# Patient Record
Sex: Female | Born: 1970 | Race: Black or African American | Hispanic: No | Marital: Married | Smoking: Never smoker
Health system: Southern US, Community
[De-identification: ages and names within clinical notes are randomized; demographics above are authoritative.]

## PROBLEM LIST (undated history)

## (undated) ENCOUNTER — Inpatient Hospital Stay (HOSPITAL_COMMUNITY): Payer: Managed Care, Other (non HMO)

## (undated) DIAGNOSIS — M797 Fibromyalgia: Secondary | ICD-10-CM

## (undated) DIAGNOSIS — L509 Urticaria, unspecified: Secondary | ICD-10-CM

## (undated) DIAGNOSIS — N83202 Unspecified ovarian cyst, left side: Secondary | ICD-10-CM

## (undated) DIAGNOSIS — R519 Headache, unspecified: Secondary | ICD-10-CM

## (undated) DIAGNOSIS — K219 Gastro-esophageal reflux disease without esophagitis: Secondary | ICD-10-CM

## (undated) DIAGNOSIS — E0580 Other thyrotoxicosis without thyrotoxic crisis or storm: Secondary | ICD-10-CM

## (undated) DIAGNOSIS — E119 Type 2 diabetes mellitus without complications: Secondary | ICD-10-CM

## (undated) DIAGNOSIS — E1140 Type 2 diabetes mellitus with diabetic neuropathy, unspecified: Secondary | ICD-10-CM

## (undated) DIAGNOSIS — D259 Leiomyoma of uterus, unspecified: Secondary | ICD-10-CM

## (undated) DIAGNOSIS — J189 Pneumonia, unspecified organism: Secondary | ICD-10-CM

## (undated) DIAGNOSIS — K5909 Other constipation: Secondary | ICD-10-CM

## (undated) DIAGNOSIS — I749 Embolism and thrombosis of unspecified artery: Secondary | ICD-10-CM

## (undated) DIAGNOSIS — K3184 Gastroparesis: Secondary | ICD-10-CM

## (undated) DIAGNOSIS — T783XXA Angioneurotic edema, initial encounter: Secondary | ICD-10-CM

## (undated) DIAGNOSIS — E1159 Type 2 diabetes mellitus with other circulatory complications: Secondary | ICD-10-CM

## (undated) DIAGNOSIS — K589 Irritable bowel syndrome without diarrhea: Secondary | ICD-10-CM

## (undated) DIAGNOSIS — N186 End stage renal disease: Secondary | ICD-10-CM

## (undated) DIAGNOSIS — R05 Cough: Secondary | ICD-10-CM

## (undated) DIAGNOSIS — D631 Anemia in chronic kidney disease: Secondary | ICD-10-CM

## (undated) DIAGNOSIS — Z7902 Long term (current) use of antithrombotics/antiplatelets: Secondary | ICD-10-CM

## (undated) DIAGNOSIS — E1129 Type 2 diabetes mellitus with other diabetic kidney complication: Secondary | ICD-10-CM

## (undated) DIAGNOSIS — Z5189 Encounter for other specified aftercare: Secondary | ICD-10-CM

## (undated) DIAGNOSIS — K859 Acute pancreatitis without necrosis or infection, unspecified: Secondary | ICD-10-CM

## (undated) DIAGNOSIS — I10 Essential (primary) hypertension: Secondary | ICD-10-CM

## (undated) DIAGNOSIS — E1169 Type 2 diabetes mellitus with other specified complication: Secondary | ICD-10-CM

## (undated) DIAGNOSIS — H6982 Other specified disorders of Eustachian tube, left ear: Secondary | ICD-10-CM

## (undated) DIAGNOSIS — E1165 Type 2 diabetes mellitus with hyperglycemia: Secondary | ICD-10-CM

## (undated) DIAGNOSIS — E538 Deficiency of other specified B group vitamins: Secondary | ICD-10-CM

## (undated) DIAGNOSIS — T82898A Other specified complication of vascular prosthetic devices, implants and grafts, initial encounter: Secondary | ICD-10-CM

## (undated) DIAGNOSIS — F329 Major depressive disorder, single episode, unspecified: Secondary | ICD-10-CM

## (undated) DIAGNOSIS — Z992 Dependence on renal dialysis: Secondary | ICD-10-CM

## (undated) DIAGNOSIS — M545 Low back pain: Secondary | ICD-10-CM

## (undated) DIAGNOSIS — E785 Hyperlipidemia, unspecified: Secondary | ICD-10-CM

## (undated) DIAGNOSIS — R531 Weakness: Secondary | ICD-10-CM

## (undated) DIAGNOSIS — E113599 Type 2 diabetes mellitus with proliferative diabetic retinopathy without macular edema, unspecified eye: Secondary | ICD-10-CM

## (undated) DIAGNOSIS — M216X2 Other acquired deformities of left foot: Secondary | ICD-10-CM

## (undated) DIAGNOSIS — M216X1 Other acquired deformities of right foot: Secondary | ICD-10-CM

## (undated) DIAGNOSIS — I639 Cerebral infarction, unspecified: Secondary | ICD-10-CM

## (undated) HISTORY — DX: Acute pancreatitis without necrosis or infection, unspecified: K85.90

## (undated) HISTORY — DX: Type 2 diabetes mellitus with other circulatory complications: E11.59

## (undated) HISTORY — DX: End stage renal disease: N18.6

## (undated) HISTORY — DX: Type 2 diabetes mellitus without complications: E11.9

## (undated) HISTORY — DX: Fibromyalgia: M79.7

## (undated) HISTORY — PX: FOOT SURGERY: SHX648

## (undated) HISTORY — DX: Encounter for other specified aftercare: Z51.89

## (undated) HISTORY — DX: Angioneurotic edema, initial encounter: T78.3XXA

## (undated) HISTORY — DX: Gastroparesis: K31.84

## (undated) HISTORY — DX: Essential (primary) hypertension: I10

## (undated) HISTORY — DX: Type 2 diabetes mellitus with other diabetic kidney complication: E11.29

## (undated) HISTORY — DX: Other thyrotoxicosis without thyrotoxic crisis or storm: E05.80

## (undated) HISTORY — DX: Other acquired deformities of left foot: M21.6X2

## (undated) HISTORY — DX: Other acquired deformities of right foot: M21.6X1

## (undated) HISTORY — DX: Other specified disorders of eustachian tube, left ear: H69.82

## (undated) HISTORY — DX: Embolism and thrombosis of unspecified artery: I74.9

## (undated) HISTORY — DX: Anemia in chronic kidney disease: D63.1

## (undated) HISTORY — PX: UTERINE FIBROID SURGERY: SHX826

## (undated) HISTORY — DX: Other constipation: K59.09

## (undated) HISTORY — DX: Deficiency of other specified B group vitamins: E53.8

## (undated) HISTORY — PX: EYE SURGERY: SHX253

## (undated) HISTORY — DX: Irritable bowel syndrome, unspecified: K58.9

## (undated) HISTORY — DX: Type 2 diabetes mellitus with diabetic neuropathy, unspecified: E11.40

## (undated) HISTORY — DX: Cerebral infarction, unspecified: I63.9

## (undated) HISTORY — DX: Type 2 diabetes mellitus with other specified complication: E11.69

## (undated) HISTORY — DX: Leiomyoma of uterus, unspecified: D25.9

## (undated) HISTORY — DX: Major depressive disorder, single episode, unspecified: F32.9

## (undated) HISTORY — DX: Weakness: R53.1

## (undated) HISTORY — DX: Type 2 diabetes mellitus with hyperglycemia: E11.65

## (undated) HISTORY — DX: Cough: R05

## (undated) HISTORY — DX: Urticaria, unspecified: L50.9

## (undated) HISTORY — DX: Dependence on renal dialysis: Z99.2

## (undated) HISTORY — DX: Other specified complication of vascular prosthetic devices, implants and grafts, initial encounter: T82.898A

## (undated) HISTORY — PX: VITRECTOMY: SHX106

## (undated) HISTORY — DX: Unspecified ovarian cyst, left side: N83.202

## (undated) HISTORY — DX: Low back pain: M54.5

## (undated) HISTORY — DX: Hyperlipidemia, unspecified: E78.5

## (undated) HISTORY — DX: Type 2 diabetes mellitus with proliferative diabetic retinopathy without macular edema, unspecified eye: E11.3599

## (undated) HISTORY — DX: Long term (current) use of antithrombotics/antiplatelets: Z79.02

## (undated) HISTORY — PX: MYOMECTOMY: SHX85

---

## 1998-12-23 ENCOUNTER — Emergency Department (HOSPITAL_COMMUNITY): Admission: EM | Admit: 1998-12-23 | Discharge: 1998-12-23 | Payer: Self-pay | Admitting: Emergency Medicine

## 1998-12-29 ENCOUNTER — Emergency Department (HOSPITAL_COMMUNITY): Admission: EM | Admit: 1998-12-29 | Discharge: 1998-12-29 | Payer: Self-pay | Admitting: Internal Medicine

## 1999-01-17 ENCOUNTER — Emergency Department (HOSPITAL_COMMUNITY): Admission: EM | Admit: 1999-01-17 | Discharge: 1999-01-17 | Payer: Self-pay | Admitting: Emergency Medicine

## 1999-01-19 ENCOUNTER — Emergency Department (HOSPITAL_COMMUNITY): Admission: EM | Admit: 1999-01-19 | Discharge: 1999-01-20 | Payer: Self-pay

## 1999-01-20 ENCOUNTER — Encounter: Payer: Self-pay | Admitting: Emergency Medicine

## 1999-08-22 ENCOUNTER — Emergency Department (HOSPITAL_COMMUNITY): Admission: EM | Admit: 1999-08-22 | Discharge: 1999-08-22 | Payer: Self-pay | Admitting: Emergency Medicine

## 1999-08-24 ENCOUNTER — Encounter: Admission: RE | Admit: 1999-08-24 | Discharge: 1999-08-24 | Payer: Self-pay | Admitting: Internal Medicine

## 1999-09-23 ENCOUNTER — Encounter: Admission: RE | Admit: 1999-09-23 | Discharge: 1999-09-23 | Payer: Self-pay | Admitting: Internal Medicine

## 1999-09-28 ENCOUNTER — Encounter: Admission: RE | Admit: 1999-09-28 | Discharge: 1999-12-27 | Payer: Self-pay | Admitting: *Deleted

## 1999-11-02 ENCOUNTER — Emergency Department (HOSPITAL_COMMUNITY): Admission: EM | Admit: 1999-11-02 | Discharge: 1999-11-02 | Payer: Self-pay | Admitting: Emergency Medicine

## 1999-11-23 ENCOUNTER — Emergency Department (HOSPITAL_COMMUNITY): Admission: EM | Admit: 1999-11-23 | Discharge: 1999-11-23 | Payer: Self-pay | Admitting: Emergency Medicine

## 1999-12-11 ENCOUNTER — Encounter: Admission: RE | Admit: 1999-12-11 | Discharge: 1999-12-11 | Payer: Self-pay | Admitting: Internal Medicine

## 2000-05-17 ENCOUNTER — Inpatient Hospital Stay (HOSPITAL_COMMUNITY): Admission: AD | Admit: 2000-05-17 | Discharge: 2000-05-17 | Payer: Self-pay | Admitting: *Deleted

## 2000-05-20 ENCOUNTER — Emergency Department (HOSPITAL_COMMUNITY): Admission: EM | Admit: 2000-05-20 | Discharge: 2000-05-21 | Payer: Self-pay | Admitting: Emergency Medicine

## 2000-05-22 ENCOUNTER — Emergency Department (HOSPITAL_COMMUNITY): Admission: EM | Admit: 2000-05-22 | Discharge: 2000-05-22 | Payer: Self-pay | Admitting: *Deleted

## 2000-05-23 ENCOUNTER — Emergency Department (HOSPITAL_COMMUNITY): Admission: EM | Admit: 2000-05-23 | Discharge: 2000-05-23 | Payer: Self-pay | Admitting: *Deleted

## 2000-07-22 ENCOUNTER — Emergency Department (HOSPITAL_COMMUNITY): Admission: EM | Admit: 2000-07-22 | Discharge: 2000-07-22 | Payer: Self-pay | Admitting: Emergency Medicine

## 2001-02-15 ENCOUNTER — Emergency Department (HOSPITAL_COMMUNITY): Admission: EM | Admit: 2001-02-15 | Discharge: 2001-02-15 | Payer: Self-pay | Admitting: Emergency Medicine

## 2001-02-15 ENCOUNTER — Encounter: Payer: Self-pay | Admitting: Emergency Medicine

## 2001-02-16 ENCOUNTER — Encounter: Admission: RE | Admit: 2001-02-16 | Discharge: 2001-02-16 | Payer: Self-pay | Admitting: Internal Medicine

## 2001-02-23 ENCOUNTER — Encounter: Admission: RE | Admit: 2001-02-23 | Discharge: 2001-02-23 | Payer: Self-pay | Admitting: Hematology and Oncology

## 2001-03-16 ENCOUNTER — Encounter: Admission: RE | Admit: 2001-03-16 | Discharge: 2001-03-16 | Payer: Self-pay | Admitting: Hematology and Oncology

## 2001-03-20 ENCOUNTER — Encounter: Admission: RE | Admit: 2001-03-20 | Discharge: 2001-06-18 | Payer: Self-pay

## 2001-03-23 ENCOUNTER — Encounter: Admission: RE | Admit: 2001-03-23 | Discharge: 2001-03-23 | Payer: Self-pay | Admitting: Hematology and Oncology

## 2001-04-18 ENCOUNTER — Encounter: Admission: RE | Admit: 2001-04-18 | Discharge: 2001-04-18 | Payer: Self-pay | Admitting: Internal Medicine

## 2001-04-19 ENCOUNTER — Emergency Department (HOSPITAL_COMMUNITY): Admission: EM | Admit: 2001-04-19 | Discharge: 2001-04-19 | Payer: Self-pay | Admitting: Emergency Medicine

## 2001-06-14 ENCOUNTER — Emergency Department (HOSPITAL_COMMUNITY): Admission: EM | Admit: 2001-06-14 | Discharge: 2001-06-14 | Payer: Self-pay | Admitting: Emergency Medicine

## 2002-04-12 ENCOUNTER — Emergency Department (HOSPITAL_COMMUNITY): Admission: EM | Admit: 2002-04-12 | Discharge: 2002-04-12 | Payer: Self-pay | Admitting: Internal Medicine

## 2002-09-17 ENCOUNTER — Inpatient Hospital Stay (HOSPITAL_COMMUNITY): Admission: AD | Admit: 2002-09-17 | Discharge: 2002-09-17 | Payer: Self-pay | Admitting: *Deleted

## 2003-01-02 ENCOUNTER — Emergency Department (HOSPITAL_COMMUNITY): Admission: EM | Admit: 2003-01-02 | Discharge: 2003-01-02 | Payer: Self-pay | Admitting: Emergency Medicine

## 2003-01-03 ENCOUNTER — Emergency Department (HOSPITAL_COMMUNITY): Admission: EM | Admit: 2003-01-03 | Discharge: 2003-01-03 | Payer: Self-pay | Admitting: Emergency Medicine

## 2003-01-18 ENCOUNTER — Emergency Department (HOSPITAL_COMMUNITY): Admission: EM | Admit: 2003-01-18 | Discharge: 2003-01-18 | Payer: Self-pay | Admitting: Emergency Medicine

## 2003-04-30 ENCOUNTER — Encounter: Payer: Self-pay | Admitting: Obstetrics

## 2003-04-30 ENCOUNTER — Ambulatory Visit (HOSPITAL_COMMUNITY): Admission: RE | Admit: 2003-04-30 | Discharge: 2003-04-30 | Payer: Self-pay | Admitting: Obstetrics

## 2003-05-21 ENCOUNTER — Encounter: Payer: Self-pay | Admitting: Emergency Medicine

## 2003-05-21 ENCOUNTER — Emergency Department (HOSPITAL_COMMUNITY): Admission: EM | Admit: 2003-05-21 | Discharge: 2003-05-21 | Payer: Self-pay | Admitting: Emergency Medicine

## 2003-07-11 ENCOUNTER — Emergency Department (HOSPITAL_COMMUNITY): Admission: EM | Admit: 2003-07-11 | Discharge: 2003-07-11 | Payer: Self-pay | Admitting: Emergency Medicine

## 2003-10-01 ENCOUNTER — Encounter (HOSPITAL_BASED_OUTPATIENT_CLINIC_OR_DEPARTMENT_OTHER): Admission: RE | Admit: 2003-10-01 | Discharge: 2003-12-30 | Payer: Self-pay | Admitting: Internal Medicine

## 2003-12-08 ENCOUNTER — Emergency Department (HOSPITAL_COMMUNITY): Admission: AC | Admit: 2003-12-08 | Discharge: 2003-12-09 | Payer: Self-pay

## 2003-12-08 ENCOUNTER — Encounter (INDEPENDENT_AMBULATORY_CARE_PROVIDER_SITE_OTHER): Payer: Self-pay | Admitting: *Deleted

## 2004-02-18 ENCOUNTER — Encounter: Admission: RE | Admit: 2004-02-18 | Discharge: 2004-05-18 | Payer: Self-pay | Admitting: Orthopedic Surgery

## 2004-06-09 ENCOUNTER — Emergency Department (HOSPITAL_COMMUNITY): Admission: EM | Admit: 2004-06-09 | Discharge: 2004-06-09 | Payer: Self-pay | Admitting: Emergency Medicine

## 2004-08-14 ENCOUNTER — Emergency Department (HOSPITAL_COMMUNITY): Admission: EM | Admit: 2004-08-14 | Discharge: 2004-08-15 | Payer: Self-pay | Admitting: Emergency Medicine

## 2004-08-23 ENCOUNTER — Emergency Department (HOSPITAL_COMMUNITY): Admission: EM | Admit: 2004-08-23 | Discharge: 2004-08-24 | Payer: Self-pay | Admitting: Emergency Medicine

## 2004-08-26 ENCOUNTER — Emergency Department (HOSPITAL_COMMUNITY): Admission: EM | Admit: 2004-08-26 | Discharge: 2004-08-26 | Payer: Self-pay | Admitting: Emergency Medicine

## 2004-10-01 ENCOUNTER — Emergency Department (HOSPITAL_COMMUNITY): Admission: EM | Admit: 2004-10-01 | Discharge: 2004-10-02 | Payer: Self-pay | Admitting: *Deleted

## 2005-01-08 ENCOUNTER — Emergency Department (HOSPITAL_COMMUNITY): Admission: EM | Admit: 2005-01-08 | Discharge: 2005-01-08 | Payer: Self-pay | Admitting: Emergency Medicine

## 2005-05-02 ENCOUNTER — Emergency Department (HOSPITAL_COMMUNITY): Admission: EM | Admit: 2005-05-02 | Discharge: 2005-05-02 | Payer: Self-pay | Admitting: Emergency Medicine

## 2005-05-12 ENCOUNTER — Emergency Department (HOSPITAL_COMMUNITY): Admission: EM | Admit: 2005-05-12 | Discharge: 2005-05-12 | Payer: Self-pay | Admitting: Emergency Medicine

## 2005-07-07 ENCOUNTER — Emergency Department (HOSPITAL_COMMUNITY): Admission: EM | Admit: 2005-07-07 | Discharge: 2005-07-07 | Payer: Self-pay | Admitting: Emergency Medicine

## 2005-07-16 ENCOUNTER — Emergency Department (HOSPITAL_COMMUNITY): Admission: EM | Admit: 2005-07-16 | Discharge: 2005-07-17 | Payer: Self-pay | Admitting: Emergency Medicine

## 2005-07-17 ENCOUNTER — Emergency Department (HOSPITAL_COMMUNITY): Admission: EM | Admit: 2005-07-17 | Discharge: 2005-07-17 | Payer: Self-pay | Admitting: Emergency Medicine

## 2005-08-15 ENCOUNTER — Emergency Department (HOSPITAL_COMMUNITY): Admission: EM | Admit: 2005-08-15 | Discharge: 2005-08-15 | Payer: Self-pay | Admitting: Emergency Medicine

## 2005-09-06 ENCOUNTER — Inpatient Hospital Stay (HOSPITAL_COMMUNITY): Admission: AD | Admit: 2005-09-06 | Discharge: 2005-09-06 | Payer: Self-pay | Admitting: Obstetrics

## 2006-01-23 ENCOUNTER — Emergency Department (HOSPITAL_COMMUNITY): Admission: EM | Admit: 2006-01-23 | Discharge: 2006-01-23 | Payer: Self-pay | Admitting: Emergency Medicine

## 2006-04-04 ENCOUNTER — Emergency Department (HOSPITAL_COMMUNITY): Admission: EM | Admit: 2006-04-04 | Discharge: 2006-04-04 | Payer: Self-pay | Admitting: Emergency Medicine

## 2006-07-19 ENCOUNTER — Emergency Department (HOSPITAL_COMMUNITY): Admission: EM | Admit: 2006-07-19 | Discharge: 2006-07-19 | Payer: Self-pay | Admitting: Emergency Medicine

## 2006-07-20 ENCOUNTER — Emergency Department (HOSPITAL_COMMUNITY): Admission: EM | Admit: 2006-07-20 | Discharge: 2006-07-20 | Payer: Self-pay | Admitting: Emergency Medicine

## 2006-10-12 ENCOUNTER — Emergency Department (HOSPITAL_COMMUNITY): Admission: EM | Admit: 2006-10-12 | Discharge: 2006-10-13 | Payer: Self-pay | Admitting: Emergency Medicine

## 2007-01-08 ENCOUNTER — Emergency Department (HOSPITAL_COMMUNITY): Admission: EM | Admit: 2007-01-08 | Discharge: 2007-01-08 | Payer: Self-pay | Admitting: Emergency Medicine

## 2007-03-31 ENCOUNTER — Ambulatory Visit (HOSPITAL_COMMUNITY): Admission: RE | Admit: 2007-03-31 | Discharge: 2007-03-31 | Payer: Self-pay | Admitting: Obstetrics

## 2007-05-21 ENCOUNTER — Emergency Department (HOSPITAL_COMMUNITY): Admission: EM | Admit: 2007-05-21 | Discharge: 2007-05-22 | Payer: Self-pay | Admitting: Emergency Medicine

## 2007-07-01 ENCOUNTER — Emergency Department (HOSPITAL_COMMUNITY): Admission: EM | Admit: 2007-07-01 | Discharge: 2007-07-02 | Payer: Self-pay | Admitting: Emergency Medicine

## 2007-10-12 ENCOUNTER — Ambulatory Visit: Payer: Self-pay | Admitting: *Deleted

## 2007-12-24 ENCOUNTER — Encounter (INDEPENDENT_AMBULATORY_CARE_PROVIDER_SITE_OTHER): Payer: Self-pay | Admitting: *Deleted

## 2007-12-24 ENCOUNTER — Emergency Department (HOSPITAL_COMMUNITY): Admission: EM | Admit: 2007-12-24 | Discharge: 2007-12-24 | Payer: Self-pay | Admitting: Emergency Medicine

## 2007-12-26 ENCOUNTER — Emergency Department (HOSPITAL_COMMUNITY): Admission: EM | Admit: 2007-12-26 | Discharge: 2007-12-26 | Payer: Self-pay | Admitting: *Deleted

## 2007-12-26 ENCOUNTER — Encounter (INDEPENDENT_AMBULATORY_CARE_PROVIDER_SITE_OTHER): Payer: Self-pay | Admitting: *Deleted

## 2008-06-29 ENCOUNTER — Emergency Department (HOSPITAL_COMMUNITY): Admission: EM | Admit: 2008-06-29 | Discharge: 2008-06-29 | Payer: Self-pay | Admitting: Emergency Medicine

## 2008-09-15 ENCOUNTER — Inpatient Hospital Stay (HOSPITAL_COMMUNITY): Admission: EM | Admit: 2008-09-15 | Discharge: 2008-09-21 | Payer: Self-pay | Admitting: Emergency Medicine

## 2008-09-16 ENCOUNTER — Ambulatory Visit: Payer: Self-pay | Admitting: Vascular Surgery

## 2008-09-16 ENCOUNTER — Encounter (INDEPENDENT_AMBULATORY_CARE_PROVIDER_SITE_OTHER): Payer: Self-pay | Admitting: Internal Medicine

## 2008-09-17 ENCOUNTER — Encounter (INDEPENDENT_AMBULATORY_CARE_PROVIDER_SITE_OTHER): Payer: Self-pay | Admitting: Cardiovascular Disease

## 2008-09-17 ENCOUNTER — Encounter (INDEPENDENT_AMBULATORY_CARE_PROVIDER_SITE_OTHER): Payer: Self-pay | Admitting: *Deleted

## 2008-09-20 ENCOUNTER — Encounter (INDEPENDENT_AMBULATORY_CARE_PROVIDER_SITE_OTHER): Payer: Self-pay | Admitting: *Deleted

## 2008-09-20 ENCOUNTER — Encounter: Payer: Self-pay | Admitting: Internal Medicine

## 2009-02-27 ENCOUNTER — Encounter (INDEPENDENT_AMBULATORY_CARE_PROVIDER_SITE_OTHER): Payer: Self-pay | Admitting: *Deleted

## 2009-02-27 ENCOUNTER — Emergency Department (HOSPITAL_COMMUNITY): Admission: EM | Admit: 2009-02-27 | Discharge: 2009-02-27 | Payer: Self-pay | Admitting: Emergency Medicine

## 2009-03-25 ENCOUNTER — Ambulatory Visit (HOSPITAL_COMMUNITY): Admission: RE | Admit: 2009-03-25 | Discharge: 2009-03-25 | Payer: Self-pay | Admitting: Obstetrics

## 2009-04-22 ENCOUNTER — Ambulatory Visit (HOSPITAL_COMMUNITY): Admission: RE | Admit: 2009-04-22 | Discharge: 2009-04-22 | Payer: Self-pay | Admitting: Gynecology

## 2009-11-04 ENCOUNTER — Emergency Department (HOSPITAL_COMMUNITY): Admission: EM | Admit: 2009-11-04 | Discharge: 2009-11-04 | Payer: Self-pay | Admitting: Emergency Medicine

## 2010-01-02 ENCOUNTER — Emergency Department (HOSPITAL_COMMUNITY): Admission: EM | Admit: 2010-01-02 | Discharge: 2010-01-03 | Payer: Self-pay | Admitting: Emergency Medicine

## 2010-01-14 ENCOUNTER — Inpatient Hospital Stay (HOSPITAL_COMMUNITY): Admission: AD | Admit: 2010-01-14 | Discharge: 2010-01-15 | Payer: Self-pay | Admitting: Obstetrics

## 2010-03-05 ENCOUNTER — Emergency Department (HOSPITAL_COMMUNITY): Admission: EM | Admit: 2010-03-05 | Discharge: 2010-03-05 | Payer: Self-pay | Admitting: Emergency Medicine

## 2010-04-22 ENCOUNTER — Emergency Department (HOSPITAL_COMMUNITY): Admission: EM | Admit: 2010-04-22 | Discharge: 2010-04-23 | Payer: Self-pay | Admitting: Emergency Medicine

## 2010-07-09 ENCOUNTER — Emergency Department (HOSPITAL_COMMUNITY): Admission: EM | Admit: 2010-07-09 | Discharge: 2010-07-09 | Payer: Self-pay | Admitting: Emergency Medicine

## 2010-10-10 ENCOUNTER — Emergency Department (HOSPITAL_COMMUNITY): Admission: EM | Admit: 2010-10-10 | Discharge: 2010-10-11 | Payer: Self-pay | Admitting: Emergency Medicine

## 2010-10-12 ENCOUNTER — Emergency Department (HOSPITAL_COMMUNITY): Admission: EM | Admit: 2010-10-12 | Discharge: 2010-10-12 | Payer: Self-pay | Admitting: Emergency Medicine

## 2011-01-11 ENCOUNTER — Emergency Department (HOSPITAL_COMMUNITY)
Admission: EM | Admit: 2011-01-11 | Discharge: 2011-01-12 | Payer: Self-pay | Source: Home / Self Care | Admitting: Emergency Medicine

## 2011-01-12 ENCOUNTER — Encounter (INDEPENDENT_AMBULATORY_CARE_PROVIDER_SITE_OTHER): Payer: Self-pay | Admitting: *Deleted

## 2011-01-13 LAB — DIFFERENTIAL
Basophils Absolute: 0 10*3/uL (ref 0.0–0.1)
Basophils Relative: 0 % (ref 0–1)
Eosinophils Absolute: 0.1 10*3/uL (ref 0.0–0.7)
Eosinophils Relative: 1 % (ref 0–5)
Lymphocytes Relative: 6 % — ABNORMAL LOW (ref 12–46)
Lymphs Abs: 0.5 10*3/uL — ABNORMAL LOW (ref 0.7–4.0)
Monocytes Absolute: 0.4 10*3/uL (ref 0.1–1.0)
Monocytes Relative: 6 % (ref 3–12)
Neutro Abs: 6.8 10*3/uL (ref 1.7–7.7)
Neutrophils Relative %: 88 % — ABNORMAL HIGH (ref 43–77)

## 2011-01-13 LAB — URINALYSIS, ROUTINE W REFLEX MICROSCOPIC
Leukocytes, UA: NEGATIVE
Nitrite: NEGATIVE
Protein, ur: >300 mg/dL — AB
Specific Gravity, Urine: 1.036 — ABNORMAL HIGH (ref 1.005–1.030)
Urine Glucose, Fasting: 250 mg/dL — AB
Urobilinogen, UA: 0.2 mg/dL (ref 0.0–1.0)
pH: 6 (ref 5.0–8.0)

## 2011-01-13 LAB — COMPREHENSIVE METABOLIC PANEL
ALT: 18 U/L (ref 0–35)
AST: 20 U/L (ref 0–37)
Albumin: 3 g/dL — ABNORMAL LOW (ref 3.5–5.2)
Alkaline Phosphatase: 91 U/L (ref 39–117)
BUN: 9 mg/dL (ref 6–23)
CO2: 25 mEq/L (ref 19–32)
Calcium: 8.8 mg/dL (ref 8.4–10.5)
Chloride: 103 mEq/L (ref 96–112)
Creatinine, Ser: 1.46 mg/dL — ABNORMAL HIGH (ref 0.4–1.2)
GFR calc Af Amer: 48 mL/min — ABNORMAL LOW (ref >60)
GFR calc non Af Amer: 39 mL/min — ABNORMAL LOW (ref >60)
Glucose, Bld: 241 mg/dL — ABNORMAL HIGH (ref 70–99)
Potassium: 4 mEq/L (ref 3.5–5.1)
Sodium: 135 mEq/L (ref 135–145)
Total Bilirubin: 0.7 mg/dL (ref 0.3–1.2)
Total Protein: 7.7 g/dL (ref 6.0–8.3)

## 2011-01-13 LAB — CBC
HCT: 36.3 % (ref 36.0–46.0)
Hemoglobin: 11.9 g/dL — ABNORMAL LOW (ref 12.0–15.0)
MCH: 28.1 pg (ref 26.0–34.0)
MCHC: 32.8 g/dL (ref 30.0–36.0)
MCV: 85.6 fL (ref 78.0–100.0)
Platelets: 220 10*3/uL (ref 150–400)
RBC: 4.24 MIL/uL (ref 3.87–5.11)
RDW: 12.3 % (ref 11.5–15.5)
WBC: 7.8 10*3/uL (ref 4.0–10.5)

## 2011-01-13 LAB — URINE MICROSCOPIC-ADD ON

## 2011-01-13 LAB — GLUCOSE, CAPILLARY: Glucose-Capillary: 211 mg/dL — ABNORMAL HIGH (ref 70–99)

## 2011-01-13 LAB — LIPASE, BLOOD: Lipase: 36 U/L (ref 11–59)

## 2011-01-13 LAB — POCT PREGNANCY, URINE: Preg Test, Ur: NEGATIVE

## 2011-01-17 ENCOUNTER — Encounter: Payer: Self-pay | Admitting: Gynecology

## 2011-01-21 ENCOUNTER — Inpatient Hospital Stay (HOSPITAL_COMMUNITY)
Admission: EM | Admit: 2011-01-21 | Discharge: 2011-01-22 | Payer: Self-pay | Source: Home / Self Care | Attending: Internal Medicine | Admitting: Internal Medicine

## 2011-01-21 LAB — HEPATIC FUNCTION PANEL
ALT: 14 U/L (ref 0–35)
AST: 16 U/L (ref 0–37)
Bilirubin, Direct: 0.1 mg/dL (ref 0.0–0.3)
Indirect Bilirubin: 0.2 mg/dL — ABNORMAL LOW (ref 0.3–0.9)
Total Bilirubin: 0.3 mg/dL (ref 0.3–1.2)
Total Protein: 7 g/dL (ref 6.0–8.3)

## 2011-01-21 LAB — DIFFERENTIAL
Eosinophils Absolute: 0 10*3/uL (ref 0.0–0.7)
Lymphocytes Relative: 33 % (ref 12–46)
Lymphs Abs: 2.7 10*3/uL (ref 0.7–4.0)
Neutrophils Relative %: 60 % (ref 43–77)

## 2011-01-21 LAB — BASIC METABOLIC PANEL
BUN: 7 mg/dL (ref 6–23)
Calcium: 8.4 mg/dL (ref 8.4–10.5)
Creatinine, Ser: 1.38 mg/dL — ABNORMAL HIGH (ref 0.4–1.2)
GFR calc non Af Amer: 42 mL/min — ABNORMAL LOW (ref >60)

## 2011-01-21 LAB — URINE CULTURE
Colony Count: >=100000
Culture  Setup Time: 201201170441

## 2011-01-21 LAB — URINALYSIS, ROUTINE W REFLEX MICROSCOPIC
Bilirubin Urine: NEGATIVE
Nitrite: NEGATIVE
Protein, ur: >300 mg/dL — AB
Urine Glucose, Fasting: >1000 mg/dL — AB

## 2011-01-21 LAB — LIPASE, BLOOD: Lipase: 25 U/L (ref 11–59)

## 2011-01-21 LAB — GLUCOSE, CAPILLARY
Glucose-Capillary: 395 mg/dL — ABNORMAL HIGH (ref 70–99)
Glucose-Capillary: 206 mg/dL — ABNORMAL HIGH (ref 70–99)

## 2011-01-21 LAB — CBC
MCV: 84.3 fL (ref 78.0–100.0)
Platelets: 297 10*3/uL (ref 150–400)
RBC: 3.94 MIL/uL (ref 3.87–5.11)
WBC: 8.1 10*3/uL (ref 4.0–10.5)

## 2011-01-22 ENCOUNTER — Encounter (INDEPENDENT_AMBULATORY_CARE_PROVIDER_SITE_OTHER): Payer: Self-pay | Admitting: *Deleted

## 2011-01-22 LAB — BASIC METABOLIC PANEL
BUN: 5 mg/dL — ABNORMAL LOW (ref 6–23)
Calcium: 8.1 mg/dL — ABNORMAL LOW (ref 8.4–10.5)
Creatinine, Ser: 1.3 mg/dL — ABNORMAL HIGH (ref 0.4–1.2)
GFR calc non Af Amer: 45 mL/min — ABNORMAL LOW (ref >60)
Glucose, Bld: 224 mg/dL — ABNORMAL HIGH (ref 70–99)

## 2011-01-22 LAB — GLUCOSE, CAPILLARY: Glucose-Capillary: 201 mg/dL — ABNORMAL HIGH (ref 70–99)

## 2011-01-22 LAB — CK TOTAL AND CKMB (NOT AT ARMC): Relative Index: INVALID (ref 0.0–2.5)

## 2011-01-22 LAB — CBC
HCT: 30 % — ABNORMAL LOW (ref 36.0–46.0)
MCH: 27.2 pg (ref 26.0–34.0)
MCHC: 32 g/dL (ref 30.0–36.0)
MCV: 85 fL (ref 78.0–100.0)
RDW: 12.4 % (ref 11.5–15.5)

## 2011-01-22 LAB — TROPONIN I: Troponin I: 0.02 ng/mL (ref 0.00–0.06)

## 2011-01-22 LAB — HEMOGLOBIN A1C: Mean Plasma Glucose: 321 mg/dL — ABNORMAL HIGH (ref <117)

## 2011-01-24 NOTE — Discharge Summary (Signed)
Heather [name], Heather [name]           ACCOUNT NO.:  [id number]  MEDICAL RECORD NO.:  [id number]          PATIENT TYPE:  INP  LOCATION:  [place]                         FACILITY:  [facility]  PHYSICIAN:  Heather Dawson, MD       DATE OF BIRTH:  1971-01-22  DATE OF ADMISSION:  01/21/2011 DATE OF DISCHARGE:  01/22/2011                              DISCHARGE SUMMARY   PRIMARY CARE PHYSICIAN:  Heather [name], M.D.  DISCHARGE DIAGNOSES: 1. Nausea, vomiting improved during the course of hospital stay, most     likely due to diabetic gastroparesis. 2. Delayed gastric emptying study to suggest diabetic gastroparesis. 3. Chronic kidney disease stage II. 4. Hypertension. 5. Insulin-dependent diabetes. 6. Fibromyalgia. 7. Hypertension. 8. Uterine fibroid. 9. Anemia most likely due to chronic disease.  DISCHARGE MEDICATIONS: 1. Oxycodone 5 mg IR every hours as needed for pain.  The patient was     instructed to minimize use of this medication as much as possible.     The patient was given only 10 tablets. 2. Reglan 10 mg p.o. daily before meals and bedtime for 10 days.  The     patient was given total of 40 tablets. 3. Ibuprofen over-the-counter, 4 tablets q.8h. as needed for pain. 4. Lyrica 300 mg daily at bedtime. 5. NovoLog overall sliding scale 14 to 30 units subcutaneously twice     daily.  BRIEF ADMITTING HISTORY AND PHYSICAL:  [name] is a 40 year old obese African-American female with history of hypertension, 12-year history of diabetes, who presents with a 2-week duration of intractable nausea and vomiting.  RADIOLOGY AND IMAGING:  The patient had a CT of the abdomen, pelvis on January 12, 2011, which shows no acute abdominal pelvic findings. Grossly stable fibroid involving the lower uterine segment posteriorly. Stable left renal cortical scarring and the right defect.  The patient had gastric emptying study on January 22, 2011, which showed delayed gastric emptying at 120  minutes.  The patient had 53% radiotracer activity remaining in the stomach, normal is less than 30%.  LABORATORY DATA:  CBC shows a white count of 6.7, hemoglobin 9.6, hematocrit 30.0, platelet count 304,000.  Electrolytes normal with a BUN of 5, creatinine is 1.30.  Hemoglobin A1c is 12.8.  Troponins negative x3.  TSH is 1.886.  UA was negative for nitrites and leukocytes.  Lipase was 25.  Liver function tests were normal except albumin is 2.9.  HOSPITAL COURSE BY PROBLEM: 1. Nausea and vomiting.  Vomiting resolved during the hospital stay.     Nausea, the patient still had intermittent nausea but was able to     tolerate a p.o. diet.  The patient had gastric emptying study as     indicated above.  I spoke with Dr. Paulita [name] with [facility], who stated that the patient can be started on     Reglan 10 mg p.o. before meals and bedtime schedules for diabetic     gastroparesis and encouraged the patient to eat frequent small     meals with low fiber diet, low fat diet.  Have the patient followup     in his  office in 1 week. 2. Diabetes, not well-controlled.  The patient will need better     control of her diabetes.  Hemoglobin A1c is 12.8.  Will defer to     her primary care physician for further management. Will need     tighter control if the patient had diabetic gastroparesis. 3. Chronic kidney disease stage II.  Creatinine at baseline compared     to creatinine in 2009. 4. Hypertension.  The patient is not on ACE inhibitor, reason is     uncertain at this time.  Will defer to her primary care physician     whether ACE inhibitor should be added for nephroprotective effects     from diabetes.  The patient's blood pressure is 134/87.  Defer to     primary care physician for further management. 5. Anemia, most likely due to anemia of chronic disease.  Hemoglobin     dropped, most likely delusional with IV fluids. 6. History of fibromyalgia.  Continue  pregabalin.  DISCHARGE DISPOSITION:  FOLLOWUP:  The patient to follow up with Dr. Chalmers [name], her primary care physician, in 1 week.  The patient to follow up with Dr. Paulita [name] with [facility] in 1 week.  The patient was given telephone number to call and make appointment.  Time spent on discharge, talking to the patient and coordinating care was 25 minutes.     Heather Dawson, MD     SR/MEDQ  D:  01/22/2011  T:  01/22/2011  Job:  [id number]  Electronically Signed by Heather [name]  on 01/24/2011 04:34:25 PM

## 2011-01-25 NOTE — H&P (Addendum)
NAMENATILY, HULL           ACCOUNT NO.:  [id number]  MEDICAL RECORD NO.:  [id number]          PATIENT TYPE:  EMS  LOCATION:  ED                           FACILITY:  [facility]  PHYSICIAN:  Domingo Mend, M.D. DATE OF BIRTH:  07-23-1971  DATE OF ADMISSION:  01/21/2011 DATE OF DISCHARGE:                             HISTORY & PHYSICAL   PRIMARY CARE PHYSICIAN:  Jacelyn Pi, M.D.  CHIEF COMPLAINT:  Abdominal pain, nausea and vomiting.  HISTORY OF PRESENT ILLNESS:  Heather [name] is a 40 year old obese African- American woman who has a history of hypertension and insulin-dependent diabetes mellitus, who presents to the hospital today with complaints of 2-week duration of intractable nausea, vomiting as well as bilateral lower quadrant abdominal pain.  Heather [name] refers that she was in her usual state of health until approximately 14 days ago when she started having repeated episodes of emesis and nausea after each meal. Initially the vomiting was with food contents and later on just a yellowish fluid.  She initially was vomiting about 10 times a day but this has now decreased to 2 to 3 times a day.  She has never noticed any blood in her vomit.  She also has experienced some bilateral lower quadrant abdominal pain that has no relationship to food intake.  She also states that she has some mild diarrhea but this has not been her main problem.  She denies any fevers, chest pain, shortness of breath. However because of these issues, we are asked to assist with admission for further evaluation and management.  ALLERGIES:  She has no known allergies.  PAST MEDICAL HISTORY:  Significant for; 1. Hypertension. 2. Insulin-dependent diabetes. 3. Fibromyalgia.  HOME MEDICATIONS:  Include Lyrica 150 mg daily as well as NovoLog insulin in sliding scale as directed by Dr. Chalmers Cater.  SOCIAL HISTORY:  She denies any alcohol, tobacco, or illicit drug use. [name] not married.  Her boyfriend is  present at the time of my exam. She has 1 son, age 80.  FAMILY HISTORY:  Significant for diabetes in both parents.  Her mother also had hypertension, otherwise negative for heart disease, cancer and stroke.  REVIEW OF SYSTEMS IS:  Negative except as mentioned in history of present illness.  PHYSICAL EXAMINATION:  VITAL SIGNS:  On admission, blood pressure 166/97, heart rate 81, respirations 20, sats of 98% on room air and a temperature 98.0. GENERAL:  [name] alert, awake and oriented x3, is pleasant, appears to be in mild acute distress secondary to her nausea. HEENT:  Normocephalic, atraumatic.  Pupils are equally round and reactive to light and accommodation.  Her extraocular movements are intact.  She does have right scleral and conjunctival injection. NECK:  Supple.  No JVD, no lymphadenopathy, no bruits, no goiter. HEART:  Regular rate and rhythm.  She does have a prominent systolic ejection murmur. LUNGS:  Clear to auscultation bilaterally.  Her abdomen is obese, soft, nondistended with positive bowel sounds.  She does feel pain to palpation of bilateral lower quadrants. EXTREMITIES:  She has no edema.  Positive pedal pulses. NEUROLOGIC EXAM:  Grossly intact and nonfocal.  LABS ON ADMISSION:  Sodium of 133, potassium 4.2, chloride 103, bicarb 25, BUN 7, creatinine 1.38, glucose of 326; however, LFTs are within normal limits with the exception of slightly low albumin at 2.9.  WBCs 8.1, hemoglobin 11.0, platelets of 297,000, lipase of 15.  A urinalysis is negative.  Urine pregnancy test is negative.  Pelvic and abdominal CT scan that showed no acute findings.  She did have a rather large uterine fibroid.  ASSESSMENT AND PLAN: 1. Intractable nausea and vomiting.  I will admit Heather [name] to a     regular medical bed under observation.  We will give her IV fluids     and antiemetics.  I wonder if she has a diabetic gastroparesis.     Will order a gastric emptying scan.  A  CT abdomen, pelvis does not     show any major abnormalities.  As a precaution given, she does have     coronary artery disease risk factors.  I will rule her out by the     way of cardiac enzymes and EKGs.  Pregnancy and a UTI as well as     pancreatitis have been ruled out by a blood work done in the     emergency department. 2. Acute renal insufficiency.  Her baseline creatinine is unknown;     however, she does have an elevated creatinine of 1.38 while in the     emergency room today.  This is presumably secondary to prerenal     azotemia.  At this point, I will give her IV fluids and see how     this improves. 3. For hypertension, at this point I will elect to see how she     progresses.  If at any point her blood pressure continues to rise,     then we may consider adding an antihypertensive agent.  It does not     appear that she is on a blood pressure medicine at home. 4. For her diabetes mellitus,  I will check hemoglobin A1c.  Will     place on a sliding-scale insulin while in the hospital. 5. For prophylaxis, she will be on Lovenox for DVT prophylaxis.     Domingo Mend, M.D.     EH/MEDQ  D:  01/21/2011  T:  01/21/2011  Job:  [id number]  cc:   Jacelyn Pi, M.D. Fax: [phone]  Electronically Signed by Domingo Mend M.D. on 01/25/2011 08:07:14 AM

## 2011-02-08 ENCOUNTER — Encounter (INDEPENDENT_AMBULATORY_CARE_PROVIDER_SITE_OTHER): Payer: Self-pay | Admitting: *Deleted

## 2011-02-17 NOTE — Letter (Signed)
Summary: New Patient letter  [facility]  [address] [place], [place] [address]   Phone: [phone]  Fax: [phone]       02/08/2011 MRN: [id number]  Heather [name] [address] [place], [place]  [address]  Dear Heather [name],  Welcome to the Gastroenterology Division at [facility].    You are scheduled to see Dr.  Olevia Perches on 02-24-11 at 9:15A.M. on the 3rd floor [facility].  We ask that you try to arrive at our office 15 minutes prior to your appointment time to allow for check-in.  We would like you to complete the enclosed self-administered evaluation form prior to your visit and bring it with you on the day of your appointment.  We will review it with you.  Also, please bring a complete list of all your medications or, if you prefer, bring the medication bottles and we will list them.  Please bring your insurance card so that we may make a copy of it.  If your insurance requires a referral to see a specialist, please bring your referral form from your primary care physician.  Co-payments are due at the time of your visit and may be paid by cash, check or credit card.     Your office visit will consist of a consult with your physician (includes a physical exam), any laboratory testing he/she may order, scheduling of any necessary diagnostic testing (e.g. x-ray, ultrasound, CT-scan), and scheduling of a procedure (e.g. Endoscopy, Colonoscopy) if required.  Please allow enough time on your schedule to allow for any/all of these possibilities.    If you cannot keep your appointment, please call [phone] to cancel or reschedule prior to your appointment date.  This allows [name] the opportunity to schedule an appointment for another patient in need of care.  If you do not cancel or reschedule by 5 p.m. the business day prior to your appointment date, you will be charged a $50.00 late cancellation/no-show fee.    Thank you for choosing  [facility] for your medical needs.  We appreciate the opportunity to care for you.  Please visit [name] at our website  to learn more about our practice.                     Sincerely,                                                             The Gastroenterology Division

## 2011-02-19 DIAGNOSIS — D259 Leiomyoma of uterus, unspecified: Secondary | ICD-10-CM | POA: Insufficient documentation

## 2011-02-19 DIAGNOSIS — I152 Hypertension secondary to endocrine disorders: Secondary | ICD-10-CM | POA: Insufficient documentation

## 2011-02-19 DIAGNOSIS — I10 Essential (primary) hypertension: Secondary | ICD-10-CM | POA: Insufficient documentation

## 2011-02-19 DIAGNOSIS — E119 Type 2 diabetes mellitus without complications: Secondary | ICD-10-CM | POA: Insufficient documentation

## 2011-02-19 DIAGNOSIS — K3184 Gastroparesis: Secondary | ICD-10-CM

## 2011-02-19 DIAGNOSIS — Z794 Long term (current) use of insulin: Secondary | ICD-10-CM

## 2011-02-19 DIAGNOSIS — E1143 Type 2 diabetes mellitus with diabetic autonomic (poly)neuropathy: Secondary | ICD-10-CM | POA: Insufficient documentation

## 2011-02-19 DIAGNOSIS — E1159 Type 2 diabetes mellitus with other circulatory complications: Secondary | ICD-10-CM

## 2011-02-19 DIAGNOSIS — IMO0001 Reserved for inherently not codable concepts without codable children: Secondary | ICD-10-CM | POA: Insufficient documentation

## 2011-02-19 HISTORY — DX: Hypertension secondary to endocrine disorders: I15.2

## 2011-02-19 HISTORY — DX: Type 2 diabetes mellitus with other circulatory complications: E11.59

## 2011-02-23 NOTE — Discharge Summary (Signed)
Summary: Nausea, Vomiting    NAME:  Heather [name], Heather [name]           ACCOUNT NO.:  [id number]      MEDICAL RECORD NO.:  [id number]          PATIENT TYPE:  INP      LOCATION:  1608                         FACILITY:  [facility]      PHYSICIAN:  Lottie Dawson, MD       DATE OF BIRTH:  02-Feb-1971      DATE OF ADMISSION:  01/21/2011   DATE OF DISCHARGE:  01/22/2011                                  DISCHARGE SUMMARY         PRIMARY CARE PHYSICIAN:  Jacelyn Pi, M.D.      DISCHARGE DIAGNOSES:   1. Nausea, vomiting improved during the course of hospital stay, most       likely due to diabetic gastroparesis.   2. Delayed gastric emptying study to suggest diabetic gastroparesis.   3. Chronic kidney disease stage II.   4. Hypertension.   5. Insulin-dependent diabetes.   6. Fibromyalgia.   7. Hypertension.   8. Uterine fibroid.   9. Anemia most likely due to chronic disease.      DISCHARGE MEDICATIONS:   1. Oxycodone 5 mg IR every hours as needed for pain.  The patient was       instructed to minimize use of this medication as much as possible.       The patient was given only 10 tablets.   2. Reglan 10 mg p.o. daily before meals and bedtime for 10 days.  The       patient was given total of 40 tablets.   3. Ibuprofen over-the-counter, 4 tablets q.8h. as needed for pain.   4. Lyrica 300 mg daily at bedtime.   5. NovoLog overall sliding scale 14 to 30 units subcutaneously twice       daily.      BRIEF ADMITTING HISTORY AND PHYSICAL:  Heather [name] is a 40 year old   obese African-American female with history of hypertension, 12-year   history of diabetes, who presents with a 42-week duration of intractable   nausea and vomiting.      RADIOLOGY AND IMAGING:  The patient had a CT of the abdomen, pelvis on   January 12, 2011, which shows no acute abdominal pelvic findings.   Grossly stable fibroid involving the lower uterine segment posteriorly.   Stable left renal cortical scarring and the  right defect.      The patient had gastric emptying study on January 22, 2011, which showed   delayed gastric emptying at 120 minutes.  The patient had 53%   radiotracer activity remaining in the stomach, normal is less than 30%.      LABORATORY DATA:  CBC shows a white count of 6.7, hemoglobin 9.6,   hematocrit 30.0, platelet count 304,000.  Electrolytes normal with a BUN   of 5, creatinine is 1.30.      Hemoglobin A1c is 12.8.  Troponins negative x3.  TSH is 1.886.  UA was   negative for nitrites and leukocytes.  Lipase was 25.  Liver function   tests were normal except albumin is 2.9.  HOSPITAL COURSE BY PROBLEM:   1. Nausea and vomiting.  Vomiting resolved during the hospital stay.       Nausea, the patient still had intermittent nausea but was able to       tolerate a p.o. diet.  The patient had gastric emptying study as       indicated above.  I spoke with Dr. Paulita Fujita with [facility], who stated that the patient can be started on       Reglan 10 mg p.o. before meals and bedtime schedules for diabetic       gastroparesis and encouraged the patient to eat frequent small       meals with low fiber diet, low fat diet.  Have the patient followup       in his office in 1 week.   2. Diabetes, not well-controlled.  The patient will need better       control of her diabetes.  Hemoglobin A1c is 12.8.  Will defer to       her primary care physician for further management. Will need       tighter control if the patient had diabetic gastroparesis.   3. Chronic kidney disease stage II.  Creatinine at baseline compared       to creatinine in 2009.   4. Hypertension.  The patient is not on ACE inhibitor, reason is       uncertain at this time.  Will defer to her primary care physician       whether ACE inhibitor should be added for nephroprotective effects       from diabetes.  The patient's blood pressure is 134/87.  Defer to       primary care physician for further  management.   5. Anemia, most likely due to anemia of chronic disease.  Hemoglobin       dropped, most likely delusional with IV fluids.   6. History of fibromyalgia.  Continue pregabalin.      DISCHARGE DISPOSITION:  FOLLOWUP:  The patient to follow up with Dr.   Chalmers Cater, her primary care physician, in 1 week.  The patient to follow up   with Dr. Paulita Fujita with [facility] in 1 week.  The patient was given   telephone number to call and make appointment.      Time spent on discharge, talking to the patient and coordinating care   was 25 minutes.               Lottie Dawson, MD               SR/MEDQ  D:  01/22/2011  T:  01/22/2011  Job:  [id number]      Electronically Signed by Alveta Heimlich REDDY  on 01/24/2011 04:34:25 PM   Appended Document: Nausea, Vomiting per [facility], patient has not seen Dr Paulita Fujita in follow up as discharge summary suggests. She also has no upcoming appointment with Dr Paulita Fujita.

## 2011-02-24 ENCOUNTER — Ambulatory Visit (INDEPENDENT_AMBULATORY_CARE_PROVIDER_SITE_OTHER): Payer: Managed Care, Other (non HMO) | Admitting: Internal Medicine

## 2011-02-24 ENCOUNTER — Encounter: Payer: Self-pay | Admitting: Internal Medicine

## 2011-02-24 DIAGNOSIS — R110 Nausea: Secondary | ICD-10-CM | POA: Insufficient documentation

## 2011-02-24 DIAGNOSIS — Z87448 Personal history of other diseases of urinary system: Secondary | ICD-10-CM | POA: Insufficient documentation

## 2011-02-24 DIAGNOSIS — K3184 Gastroparesis: Secondary | ICD-10-CM

## 2011-03-04 NOTE — Assessment & Plan Note (Signed)
Summary: ABD PAIN//SCH'D W/PT//MEDLIST//CXPOLICY ADVISED//NO GI HX//CI...   History of Present Illness Visit Type: new patient  Primary GI MD: Delfin Edis MD Primary Provider: Azalia Bilis, MD  Requesting Provider: na Chief Complaint: Upper abd pain, pain when eating, rectal pain, and nausea  History of Present Illness:   This is a 40 year old African American female who was recently hospitalized for severe nausea and vomiting and was found to have gastroparesis. A gastric emptying scan showed a 95% retention at 60 minutes and 50% retention in 120 minutes. Her main complaint is abdominal pain which is worse with eating. Even small amounts of food seem to cause more pain. [name] taking Motrin 800 mg p.r.n. Marland KitchenShe was anemic while in the hospital. Her hemoglobin dropped to 9.6. She has heavy periods. There is no prior GI history. A CT scan of the abdomen in the hospital was essentially normal with no acute changes including gallbladder.   GI Review of Systems    Reports abdominal pain and  nausea.     Location of  Abdominal pain: upper abdomen.    Denies acid reflux, belching, bloating, chest pain, dysphagia with liquids, dysphagia with solids, heartburn, loss of appetite, vomiting, vomiting blood, weight loss, and  weight gain.      Reports irritable bowel syndrome and  rectal pain.     Denies anal fissure, black tarry stools, change in bowel habit, constipation, diarrhea, diverticulosis, fecal incontinence, heme positive stool, hemorrhoids, jaundice, light color stool, liver problems, and  rectal bleeding.    Current Medications (verified): 1)  Ibuprofen 800 Mg Tabs (Ibuprofen) .... As Needed 2)  Lyrica 150 Mg Caps (Pregabalin) .... One Capsule By Mouth At Bedtime 3)  Humulin 70/30 70-30 % Susp (Insulin Isophane & Regular) .... As Directed Two Times A Day  Allergies (verified): No Known Drug Allergies  Past History:  Past Medical History: NAUSEA (ICD-787.02) IRRITABLE BOWEL SYNDROME  (ICD-564.1) UTI'S, HX OF (ICD-V13.00) ANEMIA OF CHRONIC DISEASE (ICD-285.29) FIBROIDS, UTERUS (ICD-218.9) FIBROMYALGIA (ICD-729.1) DIABETES MELLITUS (ICD-250.00) HYPERTENSION (ICD-401.9) RENAL DISEASE, CHRONIC, STAGE II (ICD-585.2) GASTROPARESIS (ICD-536.3)      Past Surgical History: eye surgery bilateral  fibroid tumors removed surgery on both feet   Family History: Family History of Diabetes: Mother, Father No FH of Colon Cancer: Family History of Colon Polyps:Mother   Social History: Occupation: Claims Married Child Alcohol Use - no Illicit Drug Use - no Daily Caffeine Use: 4 daily   Review of Systems       The patient complains of back pain, fatigue, headaches-new, swelling of feet/legs, and urine leakage.  The patient denies allergy/sinus, anemia, anxiety-new, arthritis/joint pain, blood in urine, breast changes/lumps, change in vision, confusion, cough, coughing up blood, depression-new, fainting, fever, hearing problems, heart murmur, heart rhythm changes, itching, menstrual pain, muscle pains/cramps, night sweats, nosebleeds, pregnancy symptoms, shortness of breath, skin rash, sleeping problems, sore throat, swollen lymph glands, thirst - excessive , urination - excessive , urination changes/pain, vision changes, and voice change.         Pertinent positive and negative review of systems were noted in the above HPI. All other ROS was otherwise negative.   Vital Signs:  Patient profile:   40 year old female Height:      65 inches Weight:      245 pounds BMI:     40.92 BSA:     2.16 Pulse rate:   88 / minute Pulse rhythm:   regular BP sitting:   124 / 76  (left  arm) Cuff size:   regular  Vitals Entered By: Hope Pigeon CMA (February 24, 2011 9:48 AM)  Physical Exam  General:  [place] Snowball oriented, overweight. Eyes:  nonicteric Mouth:  no thrush Neck:  Supple; no masses or thyromegaly. Lungs:  Clear throughout to auscultation. Heart:  Regular rate and rhythm;  no murmurs, rubs,  or bruits. Abdomen:  obese abdomen very tender in epigastrium. Normoactive bowel sounds. Lower abdomen unremarkable. Liver edge at costal margin. No scars. No CVA tenderness. Rectal:  normal rectal sphincter tone. No external hemorrhoids. Soft Hemoccult negative stool in small amount. Extremities:  No clubbing, cyanosis, edema or deformities noted. Skin:  Intact without significant lesions or rashes. Psych:  Alert and cooperative. Normal mood and affect.   Impression & Recommendations:  Problem # 1:  NAUSEA (ICD-787.02)  Patient has nausea and abdominal pain. [name] status post recent hospitalization. Patient has documented diabetic gastroparesis. We need to rule out Motrin-induced gastropathy. We will proceed with an upper endoscopy. Patient should follow a gastroparesis diet. We will also give her Prilosec 40 mg daily and Carafate 1 g twice a day. We may consider adding Reglan.  Orders: EGD (EGD)  Problem # 2:  FIBROIDS, UTERUS (ICD-218.9) Patient has anemia likely from menorrhagia. [name] Hemoccult negative on today's exam.  Problem # 3:  DIABETES MELLITUS (ICD-250.00) Patient is insulin-dependent. 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 Please follow written prep instructions that were given to you today at your visit. 2)  Please pick up your prescriptions at the pharmacy. Electronic prescription(s) has already been sent for Carafate 1 gram tablets. You should take 1 tablet by mouth two times a day. We have also sent prescription for Prilosec 40 mg by mouth once daily. 3)  The medication list was reviewed and reconciled.  All changed / newly prescribed medications were explained.  A complete medication list was provided to the patient / caregiver. Prescriptions: CARAFATE 1 GM TABS (SUCRALFATE) Take 1 tablet by  mouth two times a day  #40 x 0   Entered by:   Madlyn Frankel CMA (AAMA)   Authorized by:   Lafayette Dragon MD   Signed by:   [place] ([place]) on 02/24/2011   Method used:   Electronically to        [facility] (retail)       [phone]       [place], [place]  [address]       Ph: [phone]       Fax: [phone]   RxID:   [id number] PRILOSEC 40 MG CPDR (OMEPRAZOLE) Take 1 tablet by mouth once a day  #30 x 1   Entered by:   Madlyn Frankel CMA ([place])   Authorized by:   Lafayette Dragon MD   Signed by:   [place] ([place]) on 02/24/2011   Method used:   Electronically to        [facility] (retail)       [phone]       [place], [place]  [address]       Ph: [phone]       Fax: [phone]   RxID:   [id number]

## 2011-03-04 NOTE — Letter (Signed)
Summary: Diabetic Instructions  [facility]  [place], [place] [address]   Phone: [phone]  Fax: [phone]    DEVINN STEENBERG 10/14/1971 MRN: [id number]    _x  _   INSULIN (LONG ACTING) MEDICATION INSTRUCTIONS (Humulin)   The day before your procedure:   Take  your regular evening dose    The day of your procedure:   Do not take your morning dose

## 2011-03-04 NOTE — Letter (Signed)
Summary: EGD Instructions  [facility]  [place], [place] [address]   Phone: [phone]  Fax: [phone]       KALLEN BASIC    [birth year]    MRN: [id number]       Procedure Day /Date: Tuesday 03/23/11     Arrival Time: 7:30 am     Procedure Time: 8:00 am     Location of Procedure:                    _ x _ [place] (4th Floor)  PREPARATION FOR ENDOSCOPY   On 03/23/11 THE DAY OF THE PROCEDURE:  1.   No solid foods, milk or milk products are allowed after midnight the night before your procedure.  2.   Do not drink anything colored red or purple.  Avoid juices with pulp.  No orange juice.  3.  You may drink clear liquids until 6:00 am, which is 2 hours before your procedure.                                                                                                CLEAR LIQUIDS INCLUDE: Water Jello Ice Popsicles Tea (sugar ok, no milk/cream) Powdered fruit flavored drinks Coffee (sugar ok, no milk/cream) Gatorade Juice: apple, white grape, white cranberry  Lemonade Clear bullion, consomm, broth Carbonated beverages (any kind) Strained chicken noodle soup Hard Candy   MEDICATION INSTRUCTIONS  Unless otherwise instructed, you should take regular prescription medications with a small sip of water as early as possible the morning of your procedure.                  OTHER INSTRUCTIONS  You will need a responsible adult at least 41 years of age to accompany you and drive you home.   This person must remain in the waiting room during your procedure.  Wear loose fitting clothing that is easily removed.  Leave jewelry and other valuables at home.  However, you may wish to bring a book to read or an iPod/MP3 player to listen to music as you wait for your procedure to start.  Remove all body piercing jewelry and leave at home.  Total time from sign-in until discharge is approximately 2-3 hours.  You should  go home directly after your procedure and rest.  You can resume normal activities the day after your procedure.  The day of your procedure you should not:   Drive   Make legal decisions   Operate machinery   Drink alcohol   Return to work  You will receive specific instructions about eating, activities and medications before you leave.    The above instructions have been reviewed and explained to me by   _______________________    I fully understand and can verbalize these instructions _____________________________ Date _________

## 2011-03-10 LAB — BASIC METABOLIC PANEL
BUN: 12 mg/dL (ref 6–23)
CO2: 24 mEq/L (ref 19–32)
Calcium: 8.5 mg/dL (ref 8.4–10.5)
Creatinine, Ser: 1.56 mg/dL — ABNORMAL HIGH (ref 0.4–1.2)
GFR calc Af Amer: 44 mL/min — ABNORMAL LOW (ref >60)

## 2011-03-10 LAB — DIFFERENTIAL
Eosinophils Absolute: 0 10*3/uL (ref 0.0–0.7)
Lymphs Abs: 1.3 10*3/uL (ref 0.7–4.0)
Monocytes Absolute: 0.5 10*3/uL (ref 0.1–1.0)
Monocytes Relative: 8 % (ref 3–12)
Neutrophils Relative %: 71 % (ref 43–77)

## 2011-03-10 LAB — CBC
HCT: 32.2 % — ABNORMAL LOW (ref 36.0–46.0)
Hemoglobin: 10.7 g/dL — ABNORMAL LOW (ref 12.0–15.0)
MCH: 28.6 pg (ref 26.0–34.0)
RBC: 3.76 MIL/uL — ABNORMAL LOW (ref 3.87–5.11)

## 2011-03-10 LAB — GLUCOSE, CAPILLARY: Glucose-Capillary: 417 mg/dL — ABNORMAL HIGH (ref 70–99)

## 2011-03-13 LAB — GLUCOSE, CAPILLARY: Glucose-Capillary: 171 mg/dL — ABNORMAL HIGH (ref 70–99)

## 2011-03-14 LAB — CBC
HCT: 30.3 % — ABNORMAL LOW (ref 36.0–46.0)
MCH: 28.9 pg (ref 26.0–34.0)
MCHC: 33.1 g/dL (ref 30.0–36.0)
MCV: 87.2 fL (ref 78.0–100.0)
Platelets: 242 10*3/uL (ref 150–400)
Platelets: 281 10*3/uL (ref 150–400)
RDW: 13.6 % (ref 11.5–15.5)
RDW: 17.5 % — ABNORMAL HIGH (ref 11.5–15.5)

## 2011-03-14 LAB — GLUCOSE, CAPILLARY: Glucose-Capillary: 442 mg/dL — ABNORMAL HIGH (ref 70–99)

## 2011-03-14 LAB — WET PREP, GENITAL: Yeast Wet Prep HPF POC: NONE SEEN

## 2011-03-14 LAB — URINE MICROSCOPIC-ADD ON

## 2011-03-14 LAB — URINALYSIS, ROUTINE W REFLEX MICROSCOPIC
Nitrite: NEGATIVE
Specific Gravity, Urine: 1.028 (ref 1.005–1.030)
Urobilinogen, UA: 0.2 mg/dL (ref 0.0–1.0)
pH: 5.5 (ref 5.0–8.0)

## 2011-03-14 LAB — BASIC METABOLIC PANEL
BUN: 21 mg/dL (ref 6–23)
CO2: 23 mEq/L (ref 19–32)
Chloride: 104 mEq/L (ref 96–112)
Creatinine, Ser: 1.47 mg/dL — ABNORMAL HIGH (ref 0.4–1.2)
Glucose, Bld: 439 mg/dL — ABNORMAL HIGH (ref 70–99)

## 2011-03-14 LAB — GC/CHLAMYDIA PROBE AMP, GENITAL: Chlamydia, DNA Probe: NEGATIVE

## 2011-03-14 LAB — DIFFERENTIAL
Basophils Absolute: 0 10*3/uL (ref 0.0–0.1)
Eosinophils Absolute: 0 10*3/uL (ref 0.0–0.7)
Eosinophils Relative: 1 % (ref 0–5)
Lymphs Abs: 1.8 10*3/uL (ref 0.7–4.0)

## 2011-03-14 LAB — PREGNANCY, URINE: Preg Test, Ur: NEGATIVE

## 2011-03-22 ENCOUNTER — Encounter: Payer: Self-pay | Admitting: Internal Medicine

## 2011-03-23 ENCOUNTER — Ambulatory Visit (AMBULATORY_SURGERY_CENTER): Payer: Managed Care, Other (non HMO) | Admitting: Internal Medicine

## 2011-03-23 ENCOUNTER — Encounter: Payer: Self-pay | Admitting: Internal Medicine

## 2011-03-23 VITALS — BP 148/67 | HR 76 | Temp 98.4°F | Resp 20 | Ht 65.0 in | Wt 245.0 lb

## 2011-03-23 DIAGNOSIS — K2970 Gastritis, unspecified, without bleeding: Secondary | ICD-10-CM

## 2011-03-23 DIAGNOSIS — A048 Other specified bacterial intestinal infections: Secondary | ICD-10-CM

## 2011-03-23 DIAGNOSIS — K3184 Gastroparesis: Secondary | ICD-10-CM

## 2011-03-23 DIAGNOSIS — R109 Unspecified abdominal pain: Secondary | ICD-10-CM

## 2011-03-23 DIAGNOSIS — K2940 Chronic atrophic gastritis without bleeding: Secondary | ICD-10-CM

## 2011-03-23 MED ORDER — DICYCLOMINE HCL 10 MG PO CAPS: 10.0000 mg | ORAL_CAPSULE | Freq: Three times a day (TID) | ORAL | Status: DC | PRN

## 2011-03-23 NOTE — Patient Instructions (Addendum)
See blue sheet/ green sheet for discharge instructions.  Exam normal.  Rx given to caregiver by Dr. Olevia Perches with instructions to take to pharmacy for filling.

## 2011-03-24 ENCOUNTER — Telehealth: Payer: Self-pay

## 2011-03-24 NOTE — Telephone Encounter (Signed)
Left message concerning call back. If any questions or concerns please call back.

## 2011-03-29 ENCOUNTER — Encounter: Payer: Self-pay | Admitting: Internal Medicine

## 2011-03-30 NOTE — Procedures (Signed)
Summary: Upper Endoscopy  Patient: [name] Note: All result statuses are Final unless otherwise noted.  Tests: (1) Upper Endoscopy (EGD)   EGD Upper Endoscopy       [facility].     [place], [place]  [address]          ENDOSCOPY PROCEDURE REPORT          PATIENT:  Heather [name], Heather [name]  MR#:  [id number]     BIRTHDATE:  27-Sep-1971, 39 yrs. old  GENDER:  female          ENDOSCOPIST:  Lowella Bandy. Olevia Perches, MD     Referred by:  Azalia Bilis, M.D.          PROCEDURE DATE:  03/23/2011     PROCEDURE:  EGD with biopsy, 43239     ASA CLASS:  Class II     INDICATIONS:  abdominal pain gastroparesis on GES, pain     postprandially, negative CT scan of the abdomen          MEDICATIONS:   Versed 7 mg, Fentanyl 75 mcg     TOPICAL ANESTHETIC:  Exactacain Spray          DESCRIPTION OF PROCEDURE:   After the risks benefits and     alternatives of the procedure were thoroughly explained, informed     consent was obtained.  The LB GIF-H180 [id number] endoscope was     introduced through the mouth and advanced to the second portion of     the duodenum, without limitations.  The instrument was slowly     withdrawn as the mucosa was fully examined.     <<PROCEDUREIMAGES>>          The upper, middle, and distal third of the esophagus were     carefully inspected and no abnormalities were noted. The z-line     was well seen at the GEJ. The endoscope was pushed into the fundus     which was normal including a retroflexed view. The antrum,gastric     body, first and second part of the duodenum were unremarkable.     With standard forceps, a biopsy was obtained and sent to pathology     (see image1, image2, image3, image4, image5, and image6). gastric     antrum    Retroflexed views revealed no abnormalities.    The     scope was then withdrawn from the patient and the procedure     completed.          COMPLICATIONS:  None          ENDOSCOPIC  IMPRESSION:     1) Normal EGD     nothing to account for the abd. pain     RECOMMENDATIONS:     1) Await biopsy results     Trial of Bentyl 10 mg, #30, 1 po q6 hrs prn abd. pain          REPEAT EXAM:  In 0 year(s) for.          ______________________________     Lowella Bandy. Olevia Perches, MD          CC:          n.     eSIGNED:   Lowella Bandy. Arnesia Vincelette at 03/23/2011 08:21 AM          Scott-king, Suanne Marker, [id number]  Note: An exclamation mark (!) indicates a result that was not  dispersed into the flowsheet. Document Creation Date: 03/23/2011 8:21 AM _______________________________________________________________________  (1) Order result status: Final Collection or observation date-time: 03/23/2011 08:14 Requested date-time:  Receipt date-time:  Reported date-time:  Referring Physician:   Ordering Physician: Delfin Edis [phone]) Specimen Source:  Source: Tawanna Cooler Order Number: [id number] Lab site:

## 2011-03-31 LAB — RAPID STREP SCREEN (MED CTR MEBANE ONLY): Streptococcus, Group A Screen (Direct): NEGATIVE

## 2011-04-06 ENCOUNTER — Telehealth: Payer: Self-pay | Admitting: Internal Medicine

## 2011-04-06 ENCOUNTER — Encounter: Payer: Self-pay | Admitting: *Deleted

## 2011-04-06 NOTE — Telephone Encounter (Signed)
Left message for patient to call back  

## 2011-04-06 NOTE — Telephone Encounter (Signed)
Patient received a letter with her EGD bx results. States she has H.pylori. Needs rx. No allergies. Pharmacy- [facility]. Please advise.

## 2011-04-07 NOTE — Telephone Encounter (Signed)
prevpak , please, follow instructions on the package.

## 2011-04-08 LAB — CBC
HCT: 29.5 % — ABNORMAL LOW (ref 36.0–46.0)
MCV: 82.2 fL (ref 78.0–100.0)
Platelets: 266 10*3/uL (ref 150–400)
RDW: 15.9 % — ABNORMAL HIGH (ref 11.5–15.5)

## 2011-04-08 LAB — URINALYSIS, ROUTINE W REFLEX MICROSCOPIC
Bilirubin Urine: NEGATIVE
Glucose, UA: >1000 mg/dL — AB
Ketones, ur: NEGATIVE mg/dL
Nitrite: NEGATIVE
Specific Gravity, Urine: 1.016 (ref 1.005–1.030)
pH: 7 (ref 5.0–8.0)

## 2011-04-08 LAB — POCT I-STAT, CHEM 8
BUN: 16 mg/dL (ref 6–23)
Calcium, Ion: 1.09 mmol/L — ABNORMAL LOW (ref 1.12–1.32)
Chloride: 103 mEq/L (ref 96–112)
Glucose, Bld: 304 mg/dL — ABNORMAL HIGH (ref 70–99)
TCO2: 26 mmol/L (ref 0–100)

## 2011-04-08 LAB — URINE MICROSCOPIC-ADD ON

## 2011-04-08 LAB — POCT PREGNANCY, URINE: Preg Test, Ur: NEGATIVE

## 2011-04-08 LAB — DIFFERENTIAL
Basophils Absolute: 0 10*3/uL (ref 0.0–0.1)
Basophils Relative: 0 % (ref 0–1)
Eosinophils Absolute: 0.1 10*3/uL (ref 0.0–0.7)
Eosinophils Relative: 1 % (ref 0–5)
Lymphocytes Relative: 30 % (ref 12–46)

## 2011-04-08 MED ORDER — AMOXICILL-CLARITHRO-LANSOPRAZ PO MISC: Freq: Two times a day (BID) | ORAL | Status: AC

## 2011-04-08 NOTE — Telephone Encounter (Signed)
Rx sent to patient's pharmacy. Left a voice mail for patient that rx was sent.

## 2011-05-11 NOTE — Discharge Summary (Signed)
[name], [name]                ACCOUNT NO.:  [id number]   MEDICAL RECORD NO.:  [id number]          PATIENT TYPE:  INP   LOCATION:                               FACILITY:  [facility]   PHYSICIAN:  Helen Hashimoto, MD    DATE OF BIRTH:  [birth year]   DATE OF ADMISSION:  09/20/2008  DATE OF DISCHARGE:  09/21/2008                               DISCHARGE SUMMARY   DISCHARGE DIAGNOSES:  1. Left lower lobe pneumonia.  2. Anemia.  3. Questionable left kidney lesion.  4. Elevated troponin.  5. Hypertension.  6. Diabetes mellitus.  7. Fibromyalgia.   DISCHARGE MEDICATIONS:  1. Avelox 400 mg once daily times 5 days.  2. Humalog 50/50 40 units in the morning 10 units at lunch and 50      units at night.  3. Ramipril 5 mg once a day.  4. Lasix as needed.   CONSULTATIONS:  None.   PROCEDURES DONE AND RADIOLOGY STUDIES:  1. CT angiography of the chest showed no evidence of PE and showed      left lower lobe infiltrate.  2. Abdominal x-ray on September 22 showed no acute problem.  3. Renal ultrasound September 22 showed positive 2.2 cm hypoechoic      lesion.  4. Repeat x-ray on September 24 showed no active disease.  5. 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   COURSE IN THE HOSPITAL:  1. Left lower lobe pneumonia.  The patient was admitted to the      hospital.  The patient was started on IV antibiotics.  Cultures      were sent and they were negative.  CT scan of the chest was done      and showed left lower lobe pneumonia.  Repeat x-ray was done on      September 24 showed done and showed resolution of the pneumonia.      At the time of discharge the patient was very stable.  Does not      have symptoms.  Does not have fever.  White count is normal.  Will      be discharged on 5 days of Avelox p.o.  2.  Questionable questionable renal mass.  This patient had      questionable mass seen on ultrasound.  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  Acute renal failure that was      secondary to dehydration and was corrected with IV fluids.   DISPOSITION:  Otherwise other medical conditions remained stable in the  hospital and the patient was continuing the other medications taken at  home.   ASSESSMENT TIME:  40 minutes.      Helen Hashimoto, MD  Electronically Signed     NAE/MEDQ  D:  09/21/2008  T:  09/22/2008  Job:  [id number]

## 2011-05-11 NOTE — H&P (Signed)
Heather [name], Heather [name]                ACCOUNT NO.:  [id number]   MEDICAL RECORD NO.:  [id number]          PATIENT TYPE:  INP   LOCATION:  1421                         FACILITY:  [facility]   PHYSICIAN:  Jana Hakim, M.D. DATE OF BIRTH:  1971-12-02   DATE OF ADMISSION:  09/15/2008  DATE OF DISCHARGE:                              HISTORY & PHYSICAL   PRIMARY CARE PHYSICIAN:  Jacelyn Pi, M.D.   CHIEF COMPLAINT:  Nausea and vomiting.   HISTORY OF PRESENT ILLNESS:  This is a 40 year old female, type 1  diabetic, who presents to the emergency department with complaints of  severe nausea which she has had for the past 4 days and vomiting x1.  The patient denies having any diarrhea.  Denies having any chest pain or  shortness of breath.  She does report not being able to eat and drink  foods and liquids secondary to her nausea symptoms.  She also reports  being fatigued.  Denies having any myalgias, chest congestion or nasal  congestion..   The patient was evaluated in the emergency department and was found to  have an elevated troponin level and a further workup was started.  The  patient was referred for admission.   PAST MEDICAL HISTORY:  1. Type 1 diabetes mellitus.  2. Fibromyalgia.  3. Hypertension.  4. Obesity.   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   MEDICATIONS:  1. Humalog 50/50 40 units subcutaneously q.a.m., 10 units      subcutaneously q. lunch time and 50 units subcutaneous q.p.m.  2. Lyrica 150 mg p.o. at bedtime.  3. Ramipril 5 mg p.o. daily.   ALLERGIES:  NO KNOWN DRUG ALLERGIES.   SOCIAL HISTORY:  The patient is a nonsmoker, nondrinker.   FAMILY HISTORY:  Positive for diabetes type 1 and type 2 in her family.  Mother with type 1, one sister with type 1, another sister with type 2  and a brother with type 1.  Positive hypertension in both parents.  No  coronary artery disease in her family that she knows of, and  no cancer  in her family.   REVIEW OF SYSTEMS:  Pertinents are mentioned above.   PHYSICAL EXAMINATION FINDINGS:  This is an obese 40 year old well-  developed female, in discomfort but no acute distress.  VITAL SIGNS:  Temperature 98.8, blood pressure 165/96, heart rate 95,  respirations 18, O2 saturations 100% on room air.  HEENT:  Normocephalic, atraumatic.  Pupils equally round and reactive to  light.  Extraocular movements are intact.  There is no scleral icterus.  Oropharynx is clear.  NECK:  Supple.  Full range of motion.  No thyromegaly, adenopathy or  jugular venous distention.  CARDIOVASCULAR:  Regular rate and rhythm.  No murmurs, gallops or rubs.  LUNGS: Clear to auscultation bilaterally.  ABDOMEN: Positive bowel sounds; soft, nontender, nondistended.  EXTREMITIES: Without cyanosis, clubbing or edema.  NEUROLOGIC:  Examination nonfocal.   LABORATORY STUDIES:  White blood cell count 6.4, hemoglobin 10.6,  hematocrit 32.9 and platelets 278, neutrophils 69% lymphocytes 23%.  Sodium  138, potassium 3.8, chloride 107, bicarb 26, BUN 7, creatinine  1.13 and glucose 190.  Albumin 3.2.  Urinalysis:  Large hemoglobin (of  note, the patient is on her menses).  Urine HCG negative.  Blood acetone  level negative.  Troponin 0.39.  Creatinine kinase 80, CK-MB 1.5, D-  dimer 1.95.  CT angiogram of the chest reveals no evidence of pulmonary  embolism; small focus of infiltrate in the superior segment of the left  lower lobe.   ASSESSMENT:  A 40 year old female being admitted with:  1. Nausea and vomiting.  2. Elevated D-dimer and troponin level.  3. Left lower lobe pneumonia.  4. Type 1 diabetes mellitus.  5. Hypertension.  6. Anemia.  7. Fibromyalgia.   PLAN:  The patient will be admitted to a telemetry area.  She has been  placed on cardiac enzymes Nitro paste, aspirin therapy, oxygen therapy,  beta blocker therapy; and will continue on her ACE therapy.  Full-dose  Lovenox has  been ordered.  The patient will continue the evaluation for  a possible DVT.  An ultrasound of the bilateral lower extremities has  been ordered to evaluate for DVT secondary to the elevated D-dimer.  An  anemia workup will also be started and the patient will be placed on  antibiotic therapy for pneumonia..  The patient will continue on her  insulin therapy at a slightly reduced level, and antiemetic therapy has  been ordered along with sliding-scale insulin coverage.      Jana Hakim, M.D.  Electronically Signed     HJ/MEDQ  D:  09/16/2008  T:  09/16/2008  Job:  [id number]   cc:   Jacelyn Pi, M.D.

## 2011-05-14 NOTE — Consult Note (Signed)
NAME:  Heather [name], Heather [name]                          ACCOUNT NO.:  [id number]   MEDICAL RECORD NO.:  [id number]                   PATIENT TYPE:  REC   LOCATION:  FOOT                                 FACILITY:  [facility]   PHYSICIAN:  Orlando Penner. Sevier, M.D.              DATE OF BIRTH:  [birth year]   DATE OF CONSULTATION:  10/03/2003  DATE OF DISCHARGE:                                   CONSULTATION   HISTORY:  This 40 year old black female comes self referred for evaluation  of lower extremity pain which has been present for several months.   She has a history of diabetes first diagnosed some six years ago and  apparently chronically poorly controlled until she has recently been placed  on b.i.d. 70/30 insulin, which seems to have resulted in considerable  improvement.  For a number of months now she has had pains in her lower  extremities characterized as being posteriorly in the calf and radiating  down into pretty much the entirety of the foot.  These pains are worse at  rest, that is, at night.  She has some cramping in association with them as  well.  Apparently, she was placed on Elavil in uncertain dose for this some  time back and, although it has allowed her to sleep better, it has not  entirely eliminated the pain.  [name] here today to see if we have any  suggestions about this pain management.   It is noted that despite her reported improved blood sugar control that a  random blood glucose here at approximately 9 a.m. is 269 mg/dl.   PAST MEDICAL HISTORY:  The patient has very little medical history other  than the diabetes that she has mentioned.   ALLERGIES:  She has no known medicinal allergies.   MEDICATIONS:  Regular medications include Humulin 70/30 insulin twice daily,  as indicated.  Recent institution of Avandia in uncertain dose, and Elavil  in uncertain dose.   PHYSICAL EXAMINATION:  Examination today is limited to the lower  extremities.  The patient's feet are  without gross deformity although they  do show some degree of clawing of the toes, not yet advanced to the frank  hammer-toe configuration, and in association with this clawing there is  anterior migration of the metatarsal fat pads as would be expected.  She has  light callus formation over the heels and metatarsal head areas bilaterally  but nothing that requires immediate attention.  Skin temperatures are equal  and symmetrical.  Pulses are everywhere palpable and quite adequate.  There  is no significant edema.  Monofilament testing shows that she lacks  protective sensation in her toes and metatarsal head areas and has some  preservation of sensation more proximally in the feet.   She has good motion at the ankles and no significant heel-cord tightening,  for example.   It is also noted that  she is quite pallid in her toes and, accordingly, in  looking at her tongue which is likewise quite pallid and not well papillated  and her conjunctival sacs, considerable pallor being seen there as well.   IMPRESSION:  1. The patient appears to be profoundly anemic.  This is likely on the basis     of iron deficiency in that she gives a history of heavy menses and     craving for eating ice as well as her typical physical findings.     Whatever, it clearly needs to be evaluated.  2. Her foot pain is not typically diabetic in nature but, again, given all     that we know about her, this is probably the most likely basis despite     its atypicality.   DISPOSITION:  1. The patient is given instruction regarding foot care in diabetes by video     with nurse and physician reinforcement.  2. [name] specifically encouraged to work with her primary physician to get     her diabetes under better control and to begin to monitor her A1C, which     should get to a goal level of 6.5-7.  3. It is suggested to her that she move her amitriptyline from bedtime to be     taken immediately following the  evening meal so that, hopefully, she     might actually even increase the dose without having too much hangover     the following morning.  4. It is recommended to her that she reconsult her primary physician (and     she is given a note for this purpose) that he might check her ANA,     calcium, phosphorus, magnesium, and potassium as well as evaluating her     anemia.  5. The patient is given a prescription for custom-molded inserts which may     help somewhat with her discomfort and, at the same time, may help head     off future foot problems.  We do have some funds available to assist her     with the purchase of these orthotics.  6. A follow-up visit will be here in two months after she has had some of     the above interventions and has had an opportunity to obtain and wear her     inserts.  7. It is mentioned to the patient that other drugs, such as Neurontin, are     available for the treatment of this condition if it proves to be truly     diabetic in nature.                                               Orlando Penner. London Pepper, M.D.    RES/MEDQ  D:  10/03/2003  T:  10/03/2003  Job:  [id number]   cc:   Nolene Ebbs, M.D.  [address]  [place]  [place] [address]  Fax: [phone]

## 2011-05-29 ENCOUNTER — Emergency Department (HOSPITAL_COMMUNITY)
Admission: EM | Admit: 2011-05-29 | Discharge: 2011-05-29 | Disposition: A | Discharge status: Home / Self Care | Payer: Managed Care, Other (non HMO) | Attending: Emergency Medicine | Admitting: Emergency Medicine

## 2011-05-29 ENCOUNTER — Emergency Department (HOSPITAL_COMMUNITY): Payer: Managed Care, Other (non HMO)

## 2011-05-29 DIAGNOSIS — Z794 Long term (current) use of insulin: Secondary | ICD-10-CM | POA: Insufficient documentation

## 2011-05-29 DIAGNOSIS — H538 Other visual disturbances: Secondary | ICD-10-CM | POA: Insufficient documentation

## 2011-05-29 DIAGNOSIS — I10 Essential (primary) hypertension: Secondary | ICD-10-CM | POA: Insufficient documentation

## 2011-05-29 DIAGNOSIS — R51 Headache: Secondary | ICD-10-CM | POA: Insufficient documentation

## 2011-05-29 DIAGNOSIS — E1139 Type 2 diabetes mellitus with other diabetic ophthalmic complication: Secondary | ICD-10-CM | POA: Insufficient documentation

## 2011-05-29 DIAGNOSIS — E11319 Type 2 diabetes mellitus with unspecified diabetic retinopathy without macular edema: Secondary | ICD-10-CM | POA: Insufficient documentation

## 2011-05-29 LAB — GLUCOSE, CAPILLARY: Glucose-Capillary: 345 mg/dL — ABNORMAL HIGH (ref 70–99)

## 2011-07-02 ENCOUNTER — Emergency Department (HOSPITAL_COMMUNITY)
Admission: EM | Admit: 2011-07-02 | Discharge: 2011-07-02 | Disposition: A | Discharge status: Home / Self Care | Payer: Managed Care, Other (non HMO) | Attending: Emergency Medicine | Admitting: Emergency Medicine

## 2011-07-02 DIAGNOSIS — X19XXXA Contact with other heat and hot substances, initial encounter: Secondary | ICD-10-CM | POA: Insufficient documentation

## 2011-07-02 DIAGNOSIS — Y9301 Activity, walking, marching and hiking: Secondary | ICD-10-CM | POA: Insufficient documentation

## 2011-07-02 DIAGNOSIS — E1139 Type 2 diabetes mellitus with other diabetic ophthalmic complication: Secondary | ICD-10-CM | POA: Insufficient documentation

## 2011-07-02 DIAGNOSIS — E11319 Type 2 diabetes mellitus with unspecified diabetic retinopathy without macular edema: Secondary | ICD-10-CM | POA: Insufficient documentation

## 2011-07-02 DIAGNOSIS — T25029A Burn of unspecified degree of unspecified foot, initial encounter: Secondary | ICD-10-CM | POA: Insufficient documentation

## 2011-07-02 DIAGNOSIS — I10 Essential (primary) hypertension: Secondary | ICD-10-CM | POA: Insufficient documentation

## 2011-07-02 DIAGNOSIS — Z794 Long term (current) use of insulin: Secondary | ICD-10-CM | POA: Insufficient documentation

## 2011-07-02 LAB — GLUCOSE, CAPILLARY: Glucose-Capillary: 308 mg/dL — ABNORMAL HIGH (ref 70–99)

## 2011-09-19 ENCOUNTER — Emergency Department (HOSPITAL_COMMUNITY): Payer: Managed Care, Other (non HMO)

## 2011-09-19 ENCOUNTER — Inpatient Hospital Stay (HOSPITAL_COMMUNITY)
Admission: EM | Admit: 2011-09-19 | Discharge: 2011-09-21 | DRG: 202 | Disposition: A | Discharge status: Home / Self Care | Payer: Managed Care, Other (non HMO) | Attending: Internal Medicine | Admitting: Internal Medicine

## 2011-09-19 DIAGNOSIS — E860 Dehydration: Secondary | ICD-10-CM | POA: Diagnosis present

## 2011-09-19 DIAGNOSIS — E1139 Type 2 diabetes mellitus with other diabetic ophthalmic complication: Secondary | ICD-10-CM | POA: Diagnosis present

## 2011-09-19 DIAGNOSIS — IMO0001 Reserved for inherently not codable concepts without codable children: Secondary | ICD-10-CM | POA: Diagnosis present

## 2011-09-19 DIAGNOSIS — J209 Acute bronchitis, unspecified: Principal | ICD-10-CM | POA: Diagnosis present

## 2011-09-19 DIAGNOSIS — I129 Hypertensive chronic kidney disease with stage 1 through stage 4 chronic kidney disease, or unspecified chronic kidney disease: Secondary | ICD-10-CM | POA: Diagnosis present

## 2011-09-19 DIAGNOSIS — E1169 Type 2 diabetes mellitus with other specified complication: Secondary | ICD-10-CM | POA: Diagnosis present

## 2011-09-19 DIAGNOSIS — Z794 Long term (current) use of insulin: Secondary | ICD-10-CM

## 2011-09-19 DIAGNOSIS — E1142 Type 2 diabetes mellitus with diabetic polyneuropathy: Secondary | ICD-10-CM | POA: Diagnosis present

## 2011-09-19 DIAGNOSIS — L97409 Non-pressure chronic ulcer of unspecified heel and midfoot with unspecified severity: Secondary | ICD-10-CM | POA: Diagnosis present

## 2011-09-19 DIAGNOSIS — K3184 Gastroparesis: Secondary | ICD-10-CM | POA: Diagnosis present

## 2011-09-19 DIAGNOSIS — N189 Chronic kidney disease, unspecified: Secondary | ICD-10-CM | POA: Diagnosis present

## 2011-09-19 DIAGNOSIS — E11319 Type 2 diabetes mellitus with unspecified diabetic retinopathy without macular edema: Secondary | ICD-10-CM | POA: Diagnosis present

## 2011-09-19 DIAGNOSIS — R0789 Other chest pain: Secondary | ICD-10-CM | POA: Diagnosis present

## 2011-09-19 DIAGNOSIS — E1149 Type 2 diabetes mellitus with other diabetic neurological complication: Secondary | ICD-10-CM | POA: Diagnosis present

## 2011-09-19 LAB — CARDIAC PANEL(CRET KIN+CKTOT+MB+TROPI)
CK, MB: 2.3 ng/mL (ref 0.3–4.0)
Relative Index: INVALID (ref 0.0–2.5)
Total CK: 62 U/L (ref 7–177)

## 2011-09-19 LAB — BASIC METABOLIC PANEL
Calcium: 9.1 mg/dL (ref 8.4–10.5)
GFR calc Af Amer: 46 mL/min — ABNORMAL LOW (ref >60)
GFR calc non Af Amer: 38 mL/min — ABNORMAL LOW (ref >60)
Glucose, Bld: 327 mg/dL — ABNORMAL HIGH (ref 70–99)
Potassium: 4.1 mEq/L (ref 3.5–5.1)
Sodium: 131 mEq/L — ABNORMAL LOW (ref 135–145)

## 2011-09-19 LAB — GLUCOSE, CAPILLARY
Glucose-Capillary: 324 mg/dL — ABNORMAL HIGH (ref 70–99)
Glucose-Capillary: 242 mg/dL — ABNORMAL HIGH (ref 70–99)
Glucose-Capillary: 348 mg/dL — ABNORMAL HIGH (ref 70–99)

## 2011-09-19 LAB — CBC
MCV: 85.1 fL (ref 78.0–100.0)
Platelets: 264 10*3/uL (ref 150–400)
RBC: 3.63 MIL/uL — ABNORMAL LOW (ref 3.87–5.11)
RDW: 13.8 % (ref 11.5–15.5)
WBC: 6.3 10*3/uL (ref 4.0–10.5)

## 2011-09-19 LAB — DIFFERENTIAL
Basophils Absolute: 0 10*3/uL (ref 0.0–0.1)
Basophils Relative: 0 % (ref 0–1)
Eosinophils Absolute: 0.1 10*3/uL (ref 0.0–0.7)
Eosinophils Relative: 2 % (ref 0–5)
Lymphs Abs: 1.8 10*3/uL (ref 0.7–4.0)
Neutrophils Relative %: 60 % (ref 43–77)

## 2011-09-19 LAB — CK TOTAL AND CKMB (NOT AT ARMC)
CK, MB: 2.4 ng/mL (ref 0.3–4.0)
Relative Index: INVALID (ref 0.0–2.5)

## 2011-09-19 LAB — POCT I-STAT TROPONIN I: Troponin i, poc: 0.01 ng/mL (ref 0.00–0.08)

## 2011-09-20 LAB — DIFFERENTIAL
Eosinophils Absolute: 0.2 10*3/uL (ref 0.0–0.7)
Eosinophils Relative: 3 % (ref 0–5)
Lymphocytes Relative: 30 % (ref 12–46)
Lymphs Abs: 1.8 10*3/uL (ref 0.7–4.0)
Monocytes Absolute: 0.6 10*3/uL (ref 0.1–1.0)

## 2011-09-20 LAB — CBC
Hemoglobin: 8.6 g/dL — ABNORMAL LOW (ref 12.0–15.0)
MCHC: 31.9 g/dL (ref 30.0–36.0)
RDW: 14 % (ref 11.5–15.5)
WBC: 6.2 10*3/uL (ref 4.0–10.5)

## 2011-09-20 LAB — COMPREHENSIVE METABOLIC PANEL
ALT: 14 U/L (ref 0–35)
Albumin: 2.3 g/dL — ABNORMAL LOW (ref 3.5–5.2)
Alkaline Phosphatase: 84 U/L (ref 39–117)
Chloride: 96 mEq/L (ref 96–112)
Glucose, Bld: 322 mg/dL — ABNORMAL HIGH (ref 70–99)
Potassium: 4.1 mEq/L (ref 3.5–5.1)
Sodium: 127 mEq/L — ABNORMAL LOW (ref 135–145)
Total Protein: 6.3 g/dL (ref 6.0–8.3)

## 2011-09-20 LAB — CK TOTAL AND CKMB (NOT AT ARMC): Relative Index: INVALID (ref 0.0–2.5)

## 2011-09-20 LAB — HEMOGLOBIN A1C
Hgb A1c MFr Bld: 11.4 % — ABNORMAL HIGH (ref <5.7)
Mean Plasma Glucose: 280 mg/dL — ABNORMAL HIGH (ref <117)

## 2011-09-20 LAB — GLUCOSE, CAPILLARY
Glucose-Capillary: 131 mg/dL — ABNORMAL HIGH (ref 70–99)
Glucose-Capillary: 168 mg/dL — ABNORMAL HIGH (ref 70–99)

## 2011-09-20 NOTE — H&P (Signed)
NAMEHESTA, Heather NO.:  [id number]  MEDICAL RECORD NO.:  [id number]  LOCATION:  [facility]LED                         FACILITY:  [facility]  PHYSICIAN:  Jackie Plum, MD  DATE OF BIRTH:  1971/05/28  DATE OF ADMISSION:  09/19/2011 DATE OF DISCHARGE:                             HISTORY & PHYSICAL   PRIMARY CARE PHYSICIAN:  Blythe Stanford, M.D. Family Practice.  History obtainable from the patient and the patient's spouse.  CHIEF COMPLAINT:  Chest pain of 1 day's duration and cough for about 5 days duration.  HISTORY:  The patient is a 40 year old African American female obese with history of diabetes mellitus and also complicated by diabetic retinopathy and as well as nephropathy and has history of gastroparesis, presenting to the emergency room with chest pain of 1 day's duration. The patient claimed that yesterday at rest while sitting down she developed chest pain.  She described the pain as somebody sitting on mychest, pain is about 10/10 in intensity and was radiating to the neck as well as to the left arm.  This was said to be associated with shortness of breath, diaphoresis, nausea but she denied any vomiting.  Pain was said to have persisted, however, since it occurred at night, she did not come immediately to the emergency room.  However, today, she presented to the emergency room because pain was getting progressively worse.  At the same time, the patient claimed for the past 5 to 7 days, she has been having cough that is nonproductive of sputum, but 24 hours prior to presenting to the emergency room, it was productive of whitish sputum. Associated with cough is nonpleuritic chest pain.  She claims she had fever, chills, but denied any rigor.  She denied any vomiting which she claims.  She also denied any diarrhea or hematochezia.  Symptoms were said to have persisted hence the patient presented to the emergency room again to be evaluated.  She had  ealier on been seen by her primary care physician for the same cough and was given Augmentin, but symptoms were said to have persisted, hence she presented to the hospital to be evaluated.  PAST MEDICAL HISTORY:  Positive for diabetes mellitus, hypertension, anemia, diabetic retinopathy, diabetic nephropathy, fibromyalgia, gastroparesis, and chronic kidney disease.  PAST SURGICAL HISTORY:  Myomectomy secondary to uterine fibroid and also bilateral eye surgery, most likely secondary to cataracts and diabetic retinopathy.  MEDICATIONS:  Her preadmission med include the following, 1. Temovate (clobetasol) 0.5% cream applied topically twice a day to     the feet. 2. Aleve (naproxen sodium) 220 mg 2 tablets p.o. b.i.d. p.r.n. for     pain. 3. Lyrica (pregabalin)  150 mg 1 p.o. b.i.d. 4. Humulin 70/30 insulin 45 units subcu b.i.d. 5. Zolpidem 10 mg p.o. daily. 6. Lasix 40 mg p.o. daily. 7. Augmentin 875 mg p.o. b.i.d.  ALLERGIES:  She has no known allergies.  SOCIAL HISTORY:  Negative for alcohol or tobacco use.  The patient works for [employer].  FAMILY HISTORY:  Michela Pitcher to be positive for hypertension, diabetes mellitus.  REVIEW OF SYSTEMS:  The patient denies any history of headaches. Complained about nausea, but  no vomiting.  Complained about chest pain with history persistent with associated shortness of breath.  Pain is said to be about 10/10 intensity, which she describes somebody sitting on her chest, radiating to the neck, to the left arm with associated shortness of breath with nausea, but denied any vomiting.  She also complained about cough that is nonproductive of sputum with associated nonpleuritic chest pain with subjective feeling of fever and chills, but denied any rigor.  She denied any associated vomiting.  Denied any abdominal discomfort.  No diarrhea or hematochezia.  No dysuria or hematuria.  Has periodic swelling of the lower extremities and  a nonhealing ulcer at the plantar surface closer to the right fifth digit. No intolerance to heat or cold and no neuropsychiatric disorder.  PHYSICAL EXAMINATION:  GENERAL:  On examination, obese lady with suboptimal hydration, acutely ill-looking but not in any acute respiratory distress, dehydrated. VITAL SIGNS:  Present vital signs blood pressure is 169/104, pulse is 90, respiratory rate 20, temperature is 99.1. HEENT: Pallor but pupils are reactive to light and extraocular muscles are intact. NECK:  No jugular venous distention.  No carotid bruit.  No lymphadenopathy. CHEST: Showed decreased breath sounds globally with minimal scattered rhonchi. HEART: S1 and S2, tachycardic. ABDOMEN: Soft and nontender.  Liver, spleen, kidney not palpable.  Bowel sounds are positive. EXTREMITIES: No pedal edema. NEUROLOGIC EXAM:  Nonfocal. MUSCULOSKELETAL SYSTEM:  Show dry wound on the plantar surface of the right foot closer to the fifth left digit. NEUROPSYCHIATRIC EVALUATION:  Unremarkable. SKIN:  Showed decreased turgor.  LABORATORY DATA:  Initial complete blood count with differential showed WBC of 6.3, hemoglobin of 10.0, hematocrit 30.9, MCV of 85.1, platelet count of about 264, normal differential.  First set of cardiac markers troponin-I 0.01.  Basic metabolic panel showed a sodium of 131, potassium of 4.1, chloride of 90 with a bicarb of 25, glucose is 325, BUN is 13, creatinine is 1.52.  Imaging studies done on the patient include chest x-ray which showed low lung volume without any acute disease.  IMPRESSION: 1. Chest pain questionable unstable angina. 2. Acute bronchitis. 3. Dehydration. 4. Diabetes mellitus. 5. Hypertension (not control). 6. Diabetic neuropathy. 7. Diabetic retinopathy. 8. Fibromyalgia. 9. History of gastroparesis. 10.Chronic kidney disease. 11.Anemia.  Plan is to admit the patient to Telemetry.  The patient will be slowly rehydrated with half-normal  saline IV to go at a rate of 75 cc an hour. She will be on O2 via nasal cannula at 2 L per minute.  She will be on albuterol/Atrovent nebs q.4 hourly stat dose, and she empirically be given Rocephin 1 g IV q.24.  Other medication to be given to the patient will include aspirin 325 mg p.o. daily.  She will be on nitroglycerin 0.4 mg sublingual p.r.n. for chest pain, morphine 2 mg IV q.4 p.r.n. also for chest pain.  She will be given a therapeutic dose of Lovenox, that is, 1 mg/kg subcu q. 12 hourly.  GI prophylaxis will be with Protonix 40 mg IV q. 24 and since the patient is diabetic, she will be on Humulin 70/30 insulin 45 units subcu b.i.d. followed by Accu-Cheks t.i.d. with a.c. h.s. with regular insulin sliding scale (moderate scale for her diabetic neuropathy.  She will be on Lyrica 150 mg p.o. b.i.d. Blood pressure will be controlled with hydralazine 50 mg p.o. t.i.d. as well as Lopressor 100 mg p.o. b.i.d.  Further workup to be done on this patient will include cardiac enzymes q.6  x3.  EKG stat, nebs blood culture x2 before starting IV antibiotics.  She had x-ray of the right foot to rule out osteomyelitis.  2-D echo, CBC, CMP, and magnesium will be repeated in a.m.  The patient will be followed and evaluated on day- to-day basis. Jackie Plum, MD     CN/MEDQ  D:  09/19/2011  T:  09/19/2011  Job:  [id number]  Electronically Signed by Jackie Plum  on 09/20/2011 07:00:44 AM

## 2011-09-21 LAB — COMPREHENSIVE METABOLIC PANEL
ALT: 14 U/L (ref 0–35)
Calcium: 7.9 mg/dL — ABNORMAL LOW (ref 8.4–10.5)
GFR calc Af Amer: 31 mL/min — ABNORMAL LOW (ref >60)
Glucose, Bld: 222 mg/dL — ABNORMAL HIGH (ref 70–99)
Sodium: 131 mEq/L — ABNORMAL LOW (ref 135–145)
Total Protein: 6.4 g/dL (ref 6.0–8.3)

## 2011-09-21 LAB — GLUCOSE, CAPILLARY: Glucose-Capillary: 112 mg/dL — ABNORMAL HIGH (ref 70–99)

## 2011-09-21 LAB — CBC
Hemoglobin: 9.3 g/dL — ABNORMAL LOW (ref 12.0–15.0)
MCH: 28.4 pg (ref 26.0–34.0)
MCHC: 33 g/dL (ref 30.0–36.0)
Platelets: 232 10*3/uL (ref 150–400)

## 2011-09-21 LAB — DIFFERENTIAL
Basophils Relative: 0 % (ref 0–1)
Eosinophils Absolute: 0.1 10*3/uL (ref 0.0–0.7)
Eosinophils Relative: 2 % (ref 0–5)
Monocytes Absolute: 0.6 10*3/uL (ref 0.1–1.0)
Monocytes Relative: 10 % (ref 3–12)

## 2011-09-21 NOTE — Discharge Summary (Signed)
Heather [name], KOSTIUK NO.:  [id number]  MEDICAL RECORD NO.:  [id number]  LOCATION:  [id number]                         FACILITY:  [facility]  PHYSICIAN:  Jackie Plum, MD  DATE OF BIRTH:  1971-03-13  DATE OF ADMISSION:  09/19/2011 DATE OF DISCHARGE:  09/21/2011                        DISCHARGE SUMMARY - REFERRING   PRIMARY CARE PHYSICIAN:  Blythe Stanford, MD, Family Practice.  CONSULTANT INVOLVING THE CASE:  Wound care nurse.  DISCHARGE DIAGNOSES: 1. Chest pain, resolved, low probability for unstable angina. 2. Acute bronchitis. 3. Dehydration. 4. Diabetes mellitus. 5. Hypertension. 6. Diabetic neuropathy. 7. Diabetic retinopathy. 8. Fibromyalgia. 9. History of gastroparesis. 10.Chronic kidney disease. 11.Anemia. 12.Right foot diabetic ulcer.  HISTORY:  The patient is a 40 year old African American female with history of diabetes mellitus, complicated by diabetic retinopathy as well as nephropathy and gastroparesis was admitted to the hospital on September 19, 2011, by Dr. Jackie Plum with chest pain of 1 days' duration and cough of about 5 days' duration.  The chest pain was described as someone sitting on her chest, about 10/10 in intensity, and radiating to the neck and to the left arm.  The patient claims she was short of breath, diaphoretic, and nauseated, but denied any vomiting. She also complained about cough which has been ongoing for about 5 days and cough was nonproductive of sputum, but this however, was 24-hour prior to presenting to the emergency room.  At time of presentation, the patient said the cough was productive of whitish sputum with associated nonpleuritic chest pain.  She claims she had chills and fever, but denied any rigor.  She denied any vomiting.  She denied any diarrhea or hematochezia, dysuria or hematuria.  Symptoms were said to have persisted, hence the patient presented to the hospital to be  evaluated.  PREADMISSION MEDICATIONS: 1. Temovate (clobetasol) 0.05% cream applied topically twice a day to     the feet. 2. Aleve (Naprosyn sodium) 220 mg 2 tablets p.o. b.i.d. p.r.n. for     pain. 3. Lyrica (pregabalin) 150 mg p.o. b.i.d. 4. Humulin 70/30 insulin 45 units subcutaneously b.i.d. 5. Zolpidem 10 mg p.o. daily. 6. Lasix 40 mg p.o. daily. 7. Augmentin 875 mg p.o. b.i.d.  ALLERGIES:  She has no known allergies.  Please for past surgical history, social history, family history, and review of systems refer to the initial history and physical dictated by Dr. Jackie Plum.  PHYSICAL EXAMINATION:  GENERAL:  At time, the patient was seen, she had suboptimal hydration, acutely ill-looking, but not in any acute respiratory distress, and she was dehydrated. VITAL SIGNS:  Blood pressure 169/104, pulse 90, respiratory rate 20, temperature is 99.1. HEENT:  Pallor, but pupils were reactive to light and extraocular muscles intact. NECK:  No jugular venous distention.  No carotid bruit.  No lymphadenopathy. CHEST:  Decreased breath sounds globally with minimal scattered rhonchi. CARDIAC:  Heart sounds are 1 and 2, tachycardic. ABDOMEN:  Soft, nontender.  Liver, spleen, kidney, not palpable.  Bowel sounds are positive. EXTREMITIES:  No pedal edema. NEUROLOGIC:  Nonfocal. MUSCULOSKELETAL SYSTEM:  Ulcer plantar surface of the right foot, closer to the 5th digit. NEUROPSYCHIATRIC:  Unremarkable. SKIN:  Decreased  turgor.  LABORATORY DATA:  Initial complete blood count with differential showed WBC of 6.3, hemoglobin of 10.0, hematocrit of 30.9, MCV of 85.1, platelet count of 264 normal differential.  Three sets of cardiac markers, troponin-I 0.01, less than 0.30, and less than 0.30 respectively.  CK-MB as well as creatine kinase that were all within normal range.  Basic metabolic panel showed sodium of 131, potassium of 4.1, chloride of 90 with a bicarbonate of 25, glucose is  325, BUN is 13, creatinine is 1.52.  Blood culture x2, no growth.  Hemoglobin A1c 11.4. A repeat complete blood count with differential done on September 21, 2011, showed WBC of 6.7, hemoglobin of 9.3, hematocrit of 28.2, MCV of 86.0 with a platelet count of 232.  Comprehensive metabolic panel showed sodium of 131, potassium of 4.1, chloride of 100 with a bicarbonate of 24, glucose is 222, BUN is 20, creatinine is 2.13, magnesium level is 2.2.  IMAGING STUDIES DONE:  On the patient include chest x-ray which showed low-volume lung field without any acute disease.  X-ray of the right foot showed no acute abnormality. There is extensive prior surgery with TED in 4 toe.  HOSPITAL COURSE:  The patient was admitted to Telemetry and she was given half-normal saline IV to go at rate of 95 cc an hour.  She was also placed on O2 via nasal cannula.  That is why I am giving breathing treatment q.4 h.  Other medication given to the patient include Rocephin 1 g IV q.24 h.  She was placed on aspirin 325 mg p.o. daily, nitroglycerin 0.4 mg sublingual p.r.n. for chest pain, morphine 2 mg IV q.4 h. p.r.n. for chest pain, and also therapeutic dose of Lovenox 1 mg/kg q.12 h. and this eventually was changed to prophylactic dose.  GI prophylaxis was done with Protonix 40 mg IV q.24 h.  For her diabetes mellitus,  the patient was restarted on 70/30 insulin 45 units b.i.d. and when hemoglobin A1c result was obtained this was increased to 70/30 insulin 50 units subcutaneously b.i.d.  She was also placed on Accu-Chek t.i.d. with a.c. and h.s. with regular insulin sliding scale (moderate scale).  Lyrica 150 mg p.o. b.i.d. was added to the patient's regimen as well as hydralazine 50 mg p.o. b.i.d. and Lopressor 100 mg p.o. b.i.d. was given to the patient for adequate blood pressure control.  Also given the patient was Benadryl 12.5 mg p.o. q.6 h.  The patient was followed and evaluated by me on a daily basis,  made remarkable progress. Chest pain resolved and she denied any shortness of breath.  She was also seen by the wound care nurse who recommended the patient to use Santyl ointment to be applied to the wound.  So far, the patient has remained medically stable.  She was evaluated and followed by me on daily basis.  She was seen today.  Denies any complaint.  No chest pain or shortness of breath.  Examination of the patient was essentially unremarkable.  Her vital signs, blood pressure is 124/84, temperature is 98.1, pulse 77, respiratory rate 16, medically stable.  Plan is for the patient to be discharged home today on activity as tolerated.  Low-sodium, low-cholesterol diet as well as diabetic ADA diet.  Medication to be taken at home will include, 1. Aspirin 81 mg p.o. daily. 2. Collagenase ointment (Santyl) applied topically daily to the wound. 3. Docusate sodium 100 mg p.o. b.i.d. p.r.n. 4. Ferrous sulfate 325 mg p.o. b.i.d. 5.  Hydralazine 50 mg p.o. t.i.d. 6. Insulin aspart FlexPen 1-15 units subcutaneously t.i.d. with meals. 7. 70/30 insulin NovoLog 50 units subcutaneously b.i.d. with meals. 8. Metoprolol 100 mg p.o. b.i.d. 9. Oxycodone/APAP 5/325 mg 1-2 tablets p.o. q.4 h. P.r.n. 10.Aleve (Naproxen sodium) 220 mg 2 tablets p.o. b.i.d. p.r.n. 11.Lyrica (pregabalin) 150 mg p.o. b.i.d. 12.Temovate (clobetasol) apply topically to the feet.  The patient was advised on a podiatry consult for debridement of callus as recommended by the wound care nurse.  A 2-D echo done on the patient result at discharge was pending.     Jackie Plum, MD     CN/MEDQ  D:  09/21/2011  T:  09/21/2011  Job:  [id number]  cc:   Marchia Bond, M.D. Fax: [phone]  Electronically Signed by Jackie Plum  on 09/21/2011 04:47:26 PM

## 2011-09-25 LAB — CULTURE, BLOOD (ROUTINE X 2)
Culture  Setup Time: 201209232103
Culture: NO GROWTH

## 2011-09-27 LAB — COMPREHENSIVE METABOLIC PANEL
AST: 17
Albumin: 3.2 — ABNORMAL LOW
Alkaline Phosphatase: 55
BUN: 10
Calcium: 9
Creatinine, Ser: 1.13
Creatinine, Ser: 1.27 — ABNORMAL HIGH
GFR calc Af Amer: >60
Glucose, Bld: 100 — ABNORMAL HIGH
Potassium: 3.6
Total Protein: 5.5 — ABNORMAL LOW
Total Protein: 8

## 2011-09-27 LAB — URINALYSIS, ROUTINE W REFLEX MICROSCOPIC
Glucose, UA: NEGATIVE
Leukocytes, UA: NEGATIVE
Nitrite: NEGATIVE
Protein, ur: 100 — AB
pH: 6.5

## 2011-09-27 LAB — IRON AND TIBC
Iron: 38 — ABNORMAL LOW
Saturation Ratios: 12 — ABNORMAL LOW
UIBC: 289

## 2011-09-27 LAB — CBC
HCT: 23.6 — ABNORMAL LOW
HCT: 27.4 — ABNORMAL LOW
HCT: 27.9 — ABNORMAL LOW
Hemoglobin: 7.6 — CL
Hemoglobin: 8.8 — ABNORMAL LOW
Hemoglobin: 8.9 — ABNORMAL LOW
Hemoglobin: 8.9 — ABNORMAL LOW
MCHC: 32.1
MCHC: 32.2
MCHC: 32.4
MCHC: 32.9
MCV: 83.8
MCV: 84.7
MCV: 85.5
MCV: 85.9
Platelets: 244
Platelets: 246
Platelets: 250
Platelets: 278
RBC: 3.19 — ABNORMAL LOW
RDW: 15.2
RDW: 15.6 — ABNORMAL HIGH
RDW: 16 — ABNORMAL HIGH
RDW: 16.1 — ABNORMAL HIGH
RDW: 16.1 — ABNORMAL HIGH
RDW: 16.2 — ABNORMAL HIGH
WBC: 4.9
WBC: 6.4

## 2011-09-27 LAB — GLUCOSE, CAPILLARY
Glucose-Capillary: 119 — ABNORMAL HIGH
Glucose-Capillary: 124 — ABNORMAL HIGH
Glucose-Capillary: 155 — ABNORMAL HIGH
Glucose-Capillary: 167 — ABNORMAL HIGH
Glucose-Capillary: 169 — ABNORMAL HIGH
Glucose-Capillary: 174 — ABNORMAL HIGH
Glucose-Capillary: 182 — ABNORMAL HIGH
Glucose-Capillary: 188 — ABNORMAL HIGH
Glucose-Capillary: 192 — ABNORMAL HIGH
Glucose-Capillary: 239 — ABNORMAL HIGH
Glucose-Capillary: 252 — ABNORMAL HIGH
Glucose-Capillary: 51 — ABNORMAL LOW
Glucose-Capillary: 69 — ABNORMAL LOW
Glucose-Capillary: 98

## 2011-09-27 LAB — D-DIMER, QUANTITATIVE: D-Dimer, Quant: 1.95 — ABNORMAL HIGH

## 2011-09-27 LAB — CARDIAC PANEL(CRET KIN+CKTOT+MB+TROPI)
CK, MB: 1
CK, MB: 1.1
CK, MB: 1.2
Relative Index: INVALID
Relative Index: INVALID
Relative Index: INVALID
Total CK: 44
Total CK: 68
Total CK: 70
Troponin I: 0.24 — ABNORMAL HIGH
Troponin I: 0.28 — ABNORMAL HIGH
Troponin I: 0.29 — ABNORMAL HIGH
Troponin I: 0.4 — ABNORMAL HIGH

## 2011-09-27 LAB — DIFFERENTIAL
Basophils Absolute: 0.1
Basophils Relative: 1
Eosinophils Relative: 1
Lymphocytes Relative: 12
Lymphs Abs: 1.5
Monocytes Relative: 6
Neutro Abs: 4.3
Neutro Abs: 6.6
Neutrophils Relative %: 83 — ABNORMAL HIGH

## 2011-09-27 LAB — TSH: TSH: 1.495

## 2011-09-27 LAB — BASIC METABOLIC PANEL
BUN: 7
CO2: 29
Calcium: 8.2 — ABNORMAL LOW
Calcium: 8.4
Chloride: 107
Creatinine, Ser: 1.73 — ABNORMAL HIGH
GFR calc Af Amer: 40 — ABNORMAL LOW
GFR calc Af Amer: >60
GFR calc non Af Amer: 53 — ABNORMAL LOW
Glucose, Bld: 187 — ABNORMAL HIGH
Glucose, Bld: 245 — ABNORMAL HIGH
Potassium: 3.7
Potassium: 3.7
Sodium: 139
Sodium: 141

## 2011-09-27 LAB — CROSSMATCH
ABO/RH(D): O POS
Antibody Screen: NEGATIVE

## 2011-09-27 LAB — CK TOTAL AND CKMB (NOT AT ARMC)
CK, MB: 1.5
Relative Index: INVALID

## 2011-09-27 LAB — URINE MICROSCOPIC-ADD ON

## 2011-09-27 LAB — PROTIME-INR
INR: 1.1
Prothrombin Time: 14.8

## 2011-09-27 LAB — PREGNANCY, URINE: Preg Test, Ur: NEGATIVE

## 2011-09-27 LAB — HEMOGLOBIN A1C: Mean Plasma Glucose: 206

## 2011-09-27 LAB — HEPATIC FUNCTION PANEL
AST: 15
Albumin: 2.8 — ABNORMAL LOW
Alkaline Phosphatase: 70
Bilirubin, Direct: 0.1
Total Bilirubin: 0.5

## 2011-09-27 LAB — RETICULOCYTES
RBC.: 3.5 — ABNORMAL LOW
Retic Count, Absolute: 91

## 2011-09-27 LAB — B-NATRIURETIC PEPTIDE (CONVERTED LAB): Pro B Natriuretic peptide (BNP): <30

## 2011-09-27 LAB — LACTIC ACID, PLASMA: Lactic Acid, Venous: 0.8

## 2011-09-27 LAB — APTT: aPTT: 32

## 2011-10-01 LAB — URINE CULTURE
Colony Count: NO GROWTH
Culture: NO GROWTH

## 2011-10-01 LAB — BASIC METABOLIC PANEL
Chloride: 105
GFR calc Af Amer: 58 — ABNORMAL LOW
GFR calc non Af Amer: 48 — ABNORMAL LOW
Potassium: 4.6

## 2011-10-01 LAB — B-NATRIURETIC PEPTIDE (CONVERTED LAB): Pro B Natriuretic peptide (BNP): <30

## 2011-10-01 LAB — DIFFERENTIAL
Eosinophils Absolute: 0
Eosinophils Relative: 1
Lymphocytes Relative: 24
Lymphs Abs: 2.1
Monocytes Relative: 6
Neutrophils Relative %: 69

## 2011-10-01 LAB — CBC
HCT: 29.1 — ABNORMAL LOW
MCV: 84.8
RBC: 3.43 — ABNORMAL LOW
WBC: 8.7

## 2011-10-01 LAB — URINALYSIS, ROUTINE W REFLEX MICROSCOPIC
Bilirubin Urine: NEGATIVE
Ketones, ur: NEGATIVE
Protein, ur: 100 — AB
Protein, ur: 100 — AB
Urobilinogen, UA: 0.2
Urobilinogen, UA: 0.2

## 2011-10-01 LAB — URINE MICROSCOPIC-ADD ON

## 2011-10-09 ENCOUNTER — Emergency Department (HOSPITAL_COMMUNITY)
Admission: EM | Admit: 2011-10-09 | Discharge: 2011-10-09 | Disposition: A | Discharge status: Home / Self Care | Payer: Managed Care, Other (non HMO) | Attending: Emergency Medicine | Admitting: Emergency Medicine

## 2011-10-09 DIAGNOSIS — E1139 Type 2 diabetes mellitus with other diabetic ophthalmic complication: Secondary | ICD-10-CM | POA: Insufficient documentation

## 2011-10-09 DIAGNOSIS — E11319 Type 2 diabetes mellitus with unspecified diabetic retinopathy without macular edema: Secondary | ICD-10-CM | POA: Insufficient documentation

## 2011-10-09 DIAGNOSIS — I10 Essential (primary) hypertension: Secondary | ICD-10-CM | POA: Insufficient documentation

## 2011-10-09 DIAGNOSIS — Z794 Long term (current) use of insulin: Secondary | ICD-10-CM | POA: Insufficient documentation

## 2011-10-09 DIAGNOSIS — M79609 Pain in unspecified limb: Secondary | ICD-10-CM | POA: Insufficient documentation

## 2011-10-09 DIAGNOSIS — IMO0002 Reserved for concepts with insufficient information to code with codable children: Secondary | ICD-10-CM | POA: Insufficient documentation

## 2011-10-12 LAB — BASIC METABOLIC PANEL
BUN: 8
CO2: 24
Chloride: 103
GFR calc non Af Amer: 43 — ABNORMAL LOW
Glucose, Bld: 363 — ABNORMAL HIGH
Potassium: 4.1
Sodium: 133 — ABNORMAL LOW

## 2011-10-12 LAB — POCT CARDIAC MARKERS: Troponin i, poc: <0.05

## 2011-10-12 LAB — DIFFERENTIAL
Basophils Absolute: 0
Eosinophils Absolute: 0.1
Eosinophils Relative: 1
Lymphocytes Relative: 25
Monocytes Absolute: 0.6

## 2011-10-12 LAB — CBC
HCT: 30.3 — ABNORMAL LOW
Hemoglobin: 9.9 — ABNORMAL LOW
MCHC: 32.7
MCV: 81
Platelets: 261
RDW: 15 — ABNORMAL HIGH

## 2011-11-09 ENCOUNTER — Emergency Department (HOSPITAL_COMMUNITY): Payer: Managed Care, Other (non HMO)

## 2011-11-09 ENCOUNTER — Emergency Department (HOSPITAL_COMMUNITY)
Admission: EM | Admit: 2011-11-09 | Discharge: 2011-11-09 | Disposition: A | Discharge status: Home / Self Care | Payer: Managed Care, Other (non HMO) | Attending: Emergency Medicine | Admitting: Emergency Medicine

## 2011-11-09 ENCOUNTER — Encounter (HOSPITAL_COMMUNITY): Payer: Self-pay | Admitting: *Deleted

## 2011-11-09 DIAGNOSIS — X500XXA Overexertion from strenuous movement or load, initial encounter: Secondary | ICD-10-CM | POA: Insufficient documentation

## 2011-11-09 DIAGNOSIS — E119 Type 2 diabetes mellitus without complications: Secondary | ICD-10-CM | POA: Insufficient documentation

## 2011-11-09 DIAGNOSIS — Z794 Long term (current) use of insulin: Secondary | ICD-10-CM | POA: Insufficient documentation

## 2011-11-09 DIAGNOSIS — M25579 Pain in unspecified ankle and joints of unspecified foot: Secondary | ICD-10-CM | POA: Insufficient documentation

## 2011-11-09 DIAGNOSIS — S93609A Unspecified sprain of unspecified foot, initial encounter: Secondary | ICD-10-CM | POA: Insufficient documentation

## 2011-11-09 MED ORDER — KETOROLAC TROMETHAMINE 30 MG/ML IJ SOLN
30.0000 mg | Freq: Once | INTRAMUSCULAR | Status: AC
Start: 2011-11-09 — End: 2011-11-09
  Administered 2011-11-09: 30 mg via INTRAVENOUS
  Filled 2011-11-09: qty 1

## 2011-11-09 MED ORDER — HYDROCODONE-ACETAMINOPHEN 5-500 MG PO TABS: 1.0000 | ORAL_TABLET | Freq: Four times a day (QID) | ORAL | Status: AC | PRN

## 2011-11-09 NOTE — ED Provider Notes (Signed)
Medical screening examination/treatment/procedure(s) were performed by non-physician practitioner and as supervising physician I was immediately available for consultation/collaboration. Rolland Porter, MD, [facility]   [name], MD 11/09/11 [phone]

## 2011-11-09 NOTE — ED Notes (Signed)
Pt verbalizes understanding of dx instructions.

## 2011-11-09 NOTE — ED Notes (Signed)
Pt in c/o left foot pain, states she rolled her ankle in her heels this morning and c/o pain and swelling since that time

## 2011-11-09 NOTE — ED Provider Notes (Signed)
History     CSN: [id number] Arrival date & time: 11/09/2011 12:07 AM   First MD Initiated Contact with Patient 11/09/11 0534      Chief Complaint  Patient presents with  . Foot Pain    (Consider location/radiation/quality/duration/timing/severity/associated sxs/prior treatment) HPI Comments: [name] was wearing heels when she inadvertently stepped wrong on her left foot, twisting, now having mid foot pain.  This occurred yesterday morning about 7:30.  Has previous history of fractures of the fourth metartasial  Patient is a 40 y.o. female presenting with lower extremity pain. The history is provided by the patient.  Foot Pain This is a new problem. The current episode started yesterday. The problem has been unchanged. Pertinent negatives include no chills or joint swelling. The symptoms are aggravated by walking. She has tried acetaminophen for the symptoms. The treatment provided no relief.    Past Medical History  Diagnosis Date  . Gastroparesis   . Anemia   . IBS (irritable bowel syndrome)   . Nausea   . Hx: UTI (urinary tract infection)   . Fibroids   . Fibromyalgia   . Diabetes mellitus   . Hypertension   . Chronic kidney disease     Past Surgical History  Procedure Date  . Eye surgery     bilateral  . Fibroid tumors removed   . Foot surgery     bilateral    Family History  Problem Relation Age of Onset  . Colon polyps Mother     History  Substance Use Topics  . Smoking status: Never Smoker   . Smokeless tobacco: Not on file  . Alcohol Use: No    OB History    Grav Para Term Preterm Abortions TAB SAB Ect Mult Living                  Review of Systems  Constitutional: Negative for chills and activity change.  HENT: Negative.   Eyes: Negative.   Respiratory: Negative.   Cardiovascular: Negative.   Gastrointestinal: Negative.   Genitourinary: Negative.   Musculoskeletal: Negative for joint swelling.  Neurological: Negative.   Hematological:  Negative.   Psychiatric/Behavioral: Negative.     Allergies  Review of patient's allergies indicates no known allergies.  Home Medications   Current Outpatient Rx  Name Route Sig Dispense Refill  . INSULIN ISOPHANE & REGULAR (70-30) 100 UNIT/ML [place] SUSP Subcutaneous Inject into the skin 2 (two) times daily at 10 AM and 5 PM. Patient uses sliding scale but does not know it off the top of her head and is unsure she usually gages it by how she feels and what she eats and her sugar level    . PREGABALIN 150 MG PO CAPS Oral Take 150 mg by mouth 2 (two) times daily.      [name] HYDROCODONE-ACETAMINOPHEN 5-500 MG PO TABS Oral Take 1-2 tablets by mouth every 6 (six) hours as needed for pain. 15 tablet 0    BP 166/95  Temp(Src) 98.7 F (37.1 C) (Oral)  Resp 20  SpO2 98%  Physical Exam  Constitutional: [name] oriented to person, place, and time. She appears well-developed and well-nourished.  HENT:  Head: Normocephalic.  Eyes: EOM are normal.  Neck: Neck supple.  Cardiovascular: Normal rate.   Pulmonary/Chest: Breath sounds normal.  Abdominal: Bowel sounds are normal.  Musculoskeletal:       Right shoulder: She exhibits tenderness.       Left ankle: She exhibits no ecchymosis and no  deformity.  Neurological: [name] oriented to person, place, and time.  Skin: Skin is warm and dry.  Psychiatric: She has a normal mood and affect.    ED Course  Procedures (including critical care time)  Labs Reviewed - No data to display Dg Ankle Complete Left  11/09/2011  *RADIOLOGY REPORT*  Clinical Data: 40 year old female status post trauma with pain.  LEFT ANKLE COMPLETE - 3+ VIEW  Comparison: Left foot series from the same day.  Findings: Normal mortise joint alignment.  No definite joint effusion.  Calcaneus appears intact.  Talar dome intact.  No acute fracture of the distal tibia or fibula identified.  Distal third and fourth metatarsal fractures re-identified.  IMPRESSION: No acute fracture or  dislocation identified about the left ankle. See left foot series.  Original Report Authenticated By: Randall An, M.D.   Dg Foot Complete Left  11/09/2011  *RADIOLOGY REPORT*  Clinical Data: 40 year old female with pain status post trauma.  LEFT FOOT - COMPLETE 3+ VIEW  Comparison: None.  Findings: There are nondisplaced fractures of the distal third and fourth metatarsals.  These are extra-articular. The finding at the distal fourth metatarsal could be chronic.  Normal joint spaces. Calcaneus intact.  No other fracture or dislocation identified.  IMPRESSION: Nondisplaced fractures of the distal third and fourth metatarsals. The latter could be chronic.  Original Report Authenticated By: Randall An, M.D.     1. Sprain of foot       MDM  Fracture [place], NP 11/09/11 0549  Garald Balding, NP 11/09/11 [phone]

## 2011-11-23 ENCOUNTER — Emergency Department (HOSPITAL_COMMUNITY)
Admission: EM | Admit: 2011-11-23 | Discharge: 2011-11-24 | Disposition: A | Discharge status: Home / Self Care | Payer: Managed Care, Other (non HMO) | Attending: Emergency Medicine | Admitting: Emergency Medicine

## 2011-11-23 ENCOUNTER — Encounter (HOSPITAL_COMMUNITY): Payer: Self-pay | Admitting: *Deleted

## 2011-11-23 DIAGNOSIS — Z794 Long term (current) use of insulin: Secondary | ICD-10-CM | POA: Insufficient documentation

## 2011-11-23 DIAGNOSIS — K589 Irritable bowel syndrome without diarrhea: Secondary | ICD-10-CM | POA: Insufficient documentation

## 2011-11-23 DIAGNOSIS — N189 Chronic kidney disease, unspecified: Secondary | ICD-10-CM | POA: Insufficient documentation

## 2011-11-23 DIAGNOSIS — R630 Anorexia: Secondary | ICD-10-CM | POA: Insufficient documentation

## 2011-11-23 DIAGNOSIS — R51 Headache: Secondary | ICD-10-CM | POA: Insufficient documentation

## 2011-11-23 DIAGNOSIS — I129 Hypertensive chronic kidney disease with stage 1 through stage 4 chronic kidney disease, or unspecified chronic kidney disease: Secondary | ICD-10-CM | POA: Insufficient documentation

## 2011-11-23 DIAGNOSIS — R1115 Cyclical vomiting syndrome unrelated to migraine: Secondary | ICD-10-CM | POA: Insufficient documentation

## 2011-11-23 DIAGNOSIS — Z79899 Other long term (current) drug therapy: Secondary | ICD-10-CM | POA: Insufficient documentation

## 2011-11-23 DIAGNOSIS — E119 Type 2 diabetes mellitus without complications: Secondary | ICD-10-CM | POA: Insufficient documentation

## 2011-11-23 DIAGNOSIS — R55 Syncope and collapse: Secondary | ICD-10-CM | POA: Insufficient documentation

## 2011-11-23 DIAGNOSIS — R739 Hyperglycemia, unspecified: Secondary | ICD-10-CM

## 2011-11-23 MED ORDER — ONDANSETRON HCL 4 MG/2ML IJ SOLN
INTRAMUSCULAR | Status: AC
  Administered 2011-11-23
  Filled 2011-11-23: qty 2

## 2011-11-23 NOTE — ED Notes (Signed)
Pt states she has had n/v for two days, has not been able to eat and has not been taking her insulin. Pt was at [employer] and passed out,. !@ lead by EMS, Zofran given by ems.

## 2011-11-24 LAB — URINALYSIS, ROUTINE W REFLEX MICROSCOPIC
Ketones, ur: NEGATIVE mg/dL
Leukocytes, UA: NEGATIVE
Nitrite: NEGATIVE
Protein, ur: >300 mg/dL — AB
Urobilinogen, UA: 0.2 mg/dL (ref 0.0–1.0)

## 2011-11-24 LAB — CBC
HCT: 29.9 % — ABNORMAL LOW (ref 36.0–46.0)
Hemoglobin: 9.9 g/dL — ABNORMAL LOW (ref 12.0–15.0)
MCHC: 33.1 g/dL (ref 30.0–36.0)
MCV: 83.5 fL (ref 78.0–100.0)

## 2011-11-24 LAB — DIFFERENTIAL
Basophils Relative: 0 % (ref 0–1)
Eosinophils Absolute: 0.1 10*3/uL (ref 0.0–0.7)
Lymphocytes Relative: 26 % (ref 12–46)
Lymphs Abs: 1.8 10*3/uL (ref 0.7–4.0)
Monocytes Relative: 9 % (ref 3–12)
Neutro Abs: 4.6 10*3/uL (ref 1.7–7.7)

## 2011-11-24 LAB — COMPREHENSIVE METABOLIC PANEL
Alkaline Phosphatase: 89 U/L (ref 39–117)
BUN: 11 mg/dL (ref 6–23)
Chloride: 101 mEq/L (ref 96–112)
GFR calc Af Amer: 42 mL/min — ABNORMAL LOW (ref >90)
GFR calc non Af Amer: 36 mL/min — ABNORMAL LOW (ref >90)
Glucose, Bld: 345 mg/dL — ABNORMAL HIGH (ref 70–99)
Potassium: 4.4 mEq/L (ref 3.5–5.1)
Total Bilirubin: 0.3 mg/dL (ref 0.3–1.2)

## 2011-11-24 LAB — URINE MICROSCOPIC-ADD ON

## 2011-11-24 MED ORDER — METOCLOPRAMIDE HCL 5 MG/ML IJ SOLN
10.0000 mg | Freq: Once | INTRAMUSCULAR | Status: AC
  Administered 2011-11-24: 10 mg via INTRAVENOUS
  Filled 2011-11-24: qty 2

## 2011-11-24 MED ORDER — SODIUM CHLORIDE 0.9 % IV SOLN
Freq: Once | INTRAVENOUS | Status: AC
  Administered 2011-11-24: 02:00:00 via INTRAVENOUS

## 2011-11-24 MED ORDER — SODIUM CHLORIDE 0.9 % IV BOLUS (SEPSIS)
1000.0000 mL | Freq: Once | INTRAVENOUS | Status: AC
  Administered 2011-11-24: 1000 mL via INTRAVENOUS

## 2011-11-24 MED ORDER — DIPHENHYDRAMINE HCL 50 MG/ML IJ SOLN
25.0000 mg | Freq: Once | INTRAMUSCULAR | Status: AC
  Administered 2011-11-24: 25 mg via INTRAVENOUS
  Filled 2011-11-24: qty 1

## 2011-11-24 MED ORDER — METOCLOPRAMIDE HCL 10 MG PO TABS: 10.0000 mg | ORAL_TABLET | Freq: Four times a day (QID) | ORAL | Status: DC | PRN

## 2011-11-24 NOTE — ED Provider Notes (Signed)
History     CSN: [id number] Arrival date & time: 11/23/2011 10:57 PM   First MD Initiated Contact with Patient 11/23/11 2354      Chief Complaint  Patient presents with  . Loss of Consciousness    (Consider location/radiation/quality/duration/timing/severity/associated sxs/prior treatment) HPI 40 year old female has had vomiting for the last 2 days. She states he has not been able to hold any food down but she has been able to drink small amounts of liquids. She denies abdominal pain, and also had denies diarrhea. Nothing makes her nausea and vomiting better and nothing makes it worse. She has had an associated headache and rates her pain at 3/10. She was reported to have passed out this evening at church. She denies fever, but states he has been alternately hot and cold. She denies arthralgias and myalgias. She's not taken insulin during today's that she's been vomiting, and her blood sugars have been running high. Highest blood sugar was 426 2 days ago. Blood sugar this evening was 380. Symptoms are described as severe. Past Medical History  Diagnosis Date  . Gastroparesis   . Anemia   . IBS (irritable bowel syndrome)   . Nausea   . Hx: UTI (urinary tract infection)   . Fibroids   . Fibromyalgia   . Diabetes mellitus   . Hypertension   . Chronic kidney disease     Past Surgical History  Procedure Date  . Eye surgery     bilateral  . Fibroid tumors removed   . Foot surgery     bilateral    Family History  Problem Relation Age of Onset  . Colon polyps Mother     History  Substance Use Topics  . Smoking status: Never Smoker   . Smokeless tobacco: Not on file  . Alcohol Use: No    OB History    Grav Para Term Preterm Abortions TAB SAB Ect Mult Living                  Review of Systems  Allergies  Review of patient's allergies indicates no known allergies.  Home Medications   Current Outpatient Rx  Name Route Sig Dispense Refill  . INSULIN ISOPHANE  HUMAN 100 UNIT/ML [place] SUSP Subcutaneous Inject 45 Units into the skin 2 (two) times daily before a meal.      . ONDANSETRON HCL 4 MG/2ML IJ SOLN Intravenous Inject 4 mg into the vein once. nausea     . PREGABALIN 150 MG PO CAPS Oral Take 150 mg by mouth 2 (two) times daily.        BP 115/77  Pulse 103  Temp(Src) 97.6 F (36.4 C) (Oral)  Resp 20  SpO2 96%  Physical Exam 40 year old female who is resting comfortably and in no acute distress. Vital signs are normal. Head is normocephalic and atraumatic. PERRLA, EOMI. Mucous membranes are slightly dry. Neck is supple without adenopathy or JVD. Back is nontender and there is no CVA tenderness. Lungs are clear without rales, wheezes, rhonchi. There is no chest wall tenderness. Heart has regular rate and rhythm without murmur. Abdomen is obese, soft, and nontender without masses or hepatosplenomegaly. Peristalsis is diminished. Extremities have no cyanosis or edema, full range of motion is present. Skin is warm without rashes. Neurologic: Mental status is normal, cranial nerves are intact, there no motor or sensory deficits. Psychiatric: No abnormalities of mood or affect. ED Course  Procedures (including critical care time)   Labs Reviewed  CBC  DIFFERENTIAL  COMPREHENSIVE METABOLIC PANEL  URINALYSIS, ROUTINE W REFLEX MICROSCOPIC  LIPASE, BLOOD   No results found. Results for orders placed during the hospital encounter of 11/23/11  CBC      Component Value Range   WBC 7.1  4.0 - 10.5 (K/uL)   RBC 3.58 (*) 3.87 - 5.11 (MIL/uL)   Hemoglobin 9.9 (*) 12.0 - 15.0 (g/dL)   HCT 29.9 (*) 36.0 - 46.0 (%)   MCV 83.5  78.0 - 100.0 (fL)   MCH 27.7  26.0 - 34.0 (pg)   MCHC 33.1  30.0 - 36.0 (g/dL)   RDW 12.7  11.5 - 15.5 (%)   Platelets 321  150 - 400 (K/uL)  DIFFERENTIAL      Component Value Range   Neutrophils Relative 63  43 - 77 (%)   Lymphocytes Relative 26  12 - 46 (%)   Monocytes Relative 9  3 - 12 (%)   Eosinophils Relative 2  0 - 5  (%)   Basophils Relative 0  0 - 1 (%)   Neutro Abs 4.6  1.7 - 7.7 (K/uL)   Lymphs Abs 1.8  0.7 - 4.0 (K/uL)   Monocytes Absolute 0.6  0.1 - 1.0 (K/uL)   Eosinophils Absolute 0.1  0.0 - 0.7 (K/uL)   Basophils Absolute 0.0  0.0 - 0.1 (K/uL)   Smear Review MORPHOLOGY UNREMARKABLE    COMPREHENSIVE METABOLIC PANEL      Component Value Range   Sodium 134 (*) 135 - 145 (mEq/L)   Potassium 4.4  3.5 - 5.1 (mEq/L)   Chloride 101  96 - 112 (mEq/L)   CO2 25  19 - 32 (mEq/L)   Glucose, Bld 345 (*) 70 - 99 (mg/dL)   BUN 11  6 - 23 (mg/dL)   Creatinine, Ser 1.70 (*) 0.50 - 1.10 (mg/dL)   Calcium 8.3 (*) 8.4 - 10.5 (mg/dL)   Total Protein 7.1  6.0 - 8.3 (g/dL)   Albumin 2.5 (*) 3.5 - 5.2 (g/dL)   AST 14  0 - 37 (U/L)   ALT 14  0 - 35 (U/L)   Alkaline Phosphatase 89  39 - 117 (U/L)   Total Bilirubin 0.3  0.3 - 1.2 (mg/dL)   GFR calc non Af Amer 36 (*) >90 (mL/min)   GFR calc Af Amer 42 (*) >90 (mL/min)  URINALYSIS, ROUTINE W REFLEX MICROSCOPIC      Component Value Range   Color, Urine YELLOW  YELLOW    Appearance CLEAR  CLEAR    Specific Gravity, Urine 1.014  1.005 - 1.030    pH 5.5  5.0 - 8.0    Glucose, UA 500 (*) NEGATIVE (mg/dL)   Hgb urine dipstick LARGE (*) NEGATIVE    Bilirubin Urine NEGATIVE  NEGATIVE    Ketones, ur NEGATIVE  NEGATIVE (mg/dL)   Protein, ur >300 (*) NEGATIVE (mg/dL)   Urobilinogen, UA 0.2  0.0 - 1.0 (mg/dL)   Nitrite NEGATIVE  NEGATIVE    Leukocytes, UA NEGATIVE  NEGATIVE   LIPASE, BLOOD      Component Value Range   Lipase 37  11 - 59 (U/L)  URINE MICROSCOPIC-ADD ON      Component Value Range   Squamous Epithelial / LPF FEW (*) RARE    WBC, UA 0-2  <3 (WBC/hpf)   RBC / HPF 21-50  <3 (RBC/hpf)   Bacteria, UA RARE  RARE    Dg Ankle Complete Left  11/09/2011  *RADIOLOGY REPORT*  Clinical Data: 40 year old female status  post trauma with pain.  LEFT ANKLE COMPLETE - 3+ VIEW  Comparison: Left foot series from the same day.  Findings: Normal mortise joint  alignment.  No definite joint effusion.  Calcaneus appears intact.  Talar dome intact.  No acute fracture of the distal tibia or fibula identified.  Distal third and fourth metatarsal fractures re-identified.  IMPRESSION: No acute fracture or dislocation identified about the left ankle. See left foot series.  Original Report Authenticated By: Randall An, M.D.   Dg Foot Complete Left  11/09/2011  *RADIOLOGY REPORT*  Clinical Data: 40 year old female with pain status post trauma.  LEFT FOOT - COMPLETE 3+ VIEW  Comparison: None.  Findings: There are nondisplaced fractures of the distal third and fourth metatarsals.  These are extra-articular. The finding at the distal fourth metatarsal could be chronic.  Normal joint spaces. Calcaneus intact.  No other fracture or dislocation identified.  IMPRESSION: Nondisplaced fractures of the distal third and fourth metatarsals. The latter could be chronic.  Original Report Authenticated By: Randall An, M.D.      No diagnosis found. Patient feels much better after IV fluids and IV Zofran. No evidence of ketoacidosis. She will be sent home with a prescription for Reglan for nausea.   MDM  Vomiting with hyperglycemia, need to rule out diabetic ketoacidosis.        Delora Fuel, MD [id number]

## 2012-01-31 ENCOUNTER — Encounter (HOSPITAL_COMMUNITY): Payer: Self-pay | Admitting: *Deleted

## 2012-01-31 ENCOUNTER — Inpatient Hospital Stay (HOSPITAL_COMMUNITY): Payer: Managed Care, Other (non HMO)

## 2012-01-31 ENCOUNTER — Inpatient Hospital Stay (HOSPITAL_COMMUNITY)
Admission: AD | Admit: 2012-01-31 | Discharge: 2012-01-31 | Disposition: A | Discharge status: Home / Self Care | Payer: Managed Care, Other (non HMO) | Source: Ambulatory Visit | Attending: Obstetrics | Admitting: Obstetrics

## 2012-01-31 DIAGNOSIS — I10 Essential (primary) hypertension: Secondary | ICD-10-CM | POA: Insufficient documentation

## 2012-01-31 DIAGNOSIS — E119 Type 2 diabetes mellitus without complications: Secondary | ICD-10-CM

## 2012-01-31 DIAGNOSIS — D259 Leiomyoma of uterus, unspecified: Secondary | ICD-10-CM | POA: Insufficient documentation

## 2012-01-31 DIAGNOSIS — D219 Benign neoplasm of connective and other soft tissue, unspecified: Secondary | ICD-10-CM

## 2012-01-31 DIAGNOSIS — R110 Nausea: Secondary | ICD-10-CM | POA: Insufficient documentation

## 2012-01-31 DIAGNOSIS — R109 Unspecified abdominal pain: Secondary | ICD-10-CM | POA: Insufficient documentation

## 2012-01-31 LAB — URINALYSIS, ROUTINE W REFLEX MICROSCOPIC
Glucose, UA: >1000 mg/dL — AB
Specific Gravity, Urine: 1.02 (ref 1.005–1.030)
Urobilinogen, UA: 0.2 mg/dL (ref 0.0–1.0)

## 2012-01-31 LAB — DIFFERENTIAL
Lymphs Abs: 2 10*3/uL (ref 0.7–4.0)
Monocytes Relative: 8 % (ref 3–12)
Neutro Abs: 3.4 10*3/uL (ref 1.7–7.7)
Neutrophils Relative %: 58 % (ref 43–77)

## 2012-01-31 LAB — CBC
Hemoglobin: 10.2 g/dL — ABNORMAL LOW (ref 12.0–15.0)
RBC: 3.89 MIL/uL (ref 3.87–5.11)

## 2012-01-31 LAB — URINE MICROSCOPIC-ADD ON

## 2012-01-31 LAB — WET PREP, GENITAL: Clue Cells Wet Prep HPF POC: NONE SEEN

## 2012-01-31 LAB — GLUCOSE, CAPILLARY: Glucose-Capillary: 331 mg/dL — ABNORMAL HIGH (ref 70–99)

## 2012-01-31 LAB — POCT PREGNANCY, URINE: Preg Test, Ur: NEGATIVE

## 2012-01-31 MED ORDER — PROMETHAZINE HCL 25 MG PO TABS: 12.5000 mg | ORAL_TABLET | Freq: Four times a day (QID) | ORAL | Status: DC | PRN

## 2012-01-31 MED ORDER — NAPROXEN SODIUM 550 MG PO TABS: 550.0000 mg | ORAL_TABLET | Freq: Two times a day (BID) | ORAL | Status: DC

## 2012-01-31 MED ORDER — KETOROLAC TROMETHAMINE 60 MG/2ML IM SOLN
60.0000 mg | Freq: Once | INTRAMUSCULAR | Status: AC
  Administered 2012-01-31: 60 mg via INTRAMUSCULAR
  Filled 2012-01-31: qty 2

## 2012-01-31 MED ORDER — OXYCODONE-ACETAMINOPHEN 5-325 MG PO TABS
1.0000 | ORAL_TABLET | Freq: Once | ORAL | Status: AC
  Administered 2012-01-31: 1 via ORAL
  Filled 2012-01-31: qty 1

## 2012-01-31 MED ORDER — OXYCODONE-ACETAMINOPHEN 5-325 MG PO TABS: 2.0000 | ORAL_TABLET | ORAL | Status: AC | PRN

## 2012-01-31 NOTE — ED Provider Notes (Signed)
History     CSN: [id number]  Arrival date & time 01/31/12  0211   None     Chief Complaint  Patient presents with  . Vaginal Discharge    `   HPI Heather [name] is a 41 y.o. female who presents to [facility] for low abdominal pain that started 2 days ago. She has not taken anything for pain. The pain has gotten much worse tonight. She describes the pain as pressure in her pelvis and severe. She has never had pain like this before. Current sex partner x 8 years. History of Chlamydia. The history was provided by the patient.  Past Medical History  Diagnosis Date  . Gastroparesis   . Anemia   . IBS (irritable bowel syndrome)   . Nausea   . Hx: UTI (urinary tract infection)   . Fibroids   . Fibromyalgia   . Diabetes mellitus   . Hypertension   . Chronic kidney disease     Past Surgical History  Procedure Date  . Eye surgery     bilateral  . Fibroid tumors removed   . Foot surgery     bilateral    Family History  Problem Relation Age of Onset  . Colon polyps Mother   . Anesthesia problems Neg Hx   . Hypotension Neg Hx   . Malignant hyperthermia Neg Hx   . Pseudochol deficiency Neg Hx     History  Substance Use Topics  . Smoking status: Never Smoker   . Smokeless tobacco: Not on file  . Alcohol Use: No    OB History    Grav Para Term Preterm Abortions TAB SAB Ect Mult Living   2 1   1  1   1       Review of Systems  Constitutional: Negative for fever, chills, diaphoresis and fatigue.  HENT: Negative for ear pain, congestion, sore throat, facial swelling, neck pain, neck stiffness, dental problem and sinus pressure.   Eyes: Negative for photophobia, pain and discharge.  Respiratory: Positive for cough. Negative for chest tightness and wheezing.   Cardiovascular: Negative.   Gastrointestinal: Positive for nausea and abdominal pain. Negative for vomiting, diarrhea, constipation and abdominal distention.  Genitourinary: Positive for vaginal discharge and pelvic pain.  Negative for dysuria, frequency, flank pain, vaginal bleeding and difficulty urinating.  Musculoskeletal: Negative for myalgias, back pain and gait problem.  Skin: Negative for color change and rash.  Neurological: Positive for headaches. Negative for dizziness, speech difficulty, weakness, light-headedness and numbness.  Psychiatric/Behavioral: Negative for confusion and agitation. The patient is not nervous/anxious.     Allergies  Review of patient's allergies indicates not on file.  Home Medications  No current outpatient prescriptions on file.  BP 172/93  Pulse 91  Temp(Src) 98.1 F (36.7 C) (Oral)  Resp 18  Ht 5\' 10"  (1.778 m)  Wt 236 lb (107.049 kg)  BMI 33.86 kg/m2  Physical Exam  Nursing note and vitals reviewed. Constitutional: [name] oriented to person, place, and time. She appears well-developed and well-nourished. No distress.       Blood pressure elevated.  HENT:  Head: Normocephalic.  Eyes: EOM are normal.  Neck: Neck supple.  Cardiovascular: Normal rate.   Pulmonary/Chest: Effort normal.  Abdominal: Soft. There is no tenderness.  Genitourinary:       External genitalia without lesions. White discharge vaginal vault. Cervical motion tenderness, bilateral adnexal tenderness. Uterus enlarged, firm and irregular. Tender on exam.  Musculoskeletal: Normal range of motion.  Neurological: [name] alert and oriented to person, place, and time. No cranial nerve deficit.  Skin: Skin is warm and dry.  Psychiatric: She has a normal mood and affect. Her behavior is normal. Judgment and thought content normal.    ED Course  Procedures (including critical care time)  Labs Reviewed - No data to display No results found.   No diagnosis found.  Results for orders placed during the hospital encounter of 01/31/12 (from the past 24 hour(s))  URINALYSIS, ROUTINE W REFLEX MICROSCOPIC     Status: Abnormal   Collection Time   01/31/12  2:20 AM      Component Value Range    Color, Urine YELLOW  YELLOW    APPearance HAZY (*) CLEAR    Specific Gravity, Urine 1.020  1.005 - 1.030    pH 7.0  5.0 - 8.0    Glucose, UA >1000 (*) NEGATIVE (mg/dL)   Hgb urine dipstick MODERATE (*) NEGATIVE    Bilirubin Urine NEGATIVE  NEGATIVE    Ketones, ur NEGATIVE  NEGATIVE (mg/dL)   Protein, ur 100 (*) NEGATIVE (mg/dL)   Urobilinogen, UA 0.2  0.0 - 1.0 (mg/dL)   Nitrite NEGATIVE  NEGATIVE    Leukocytes, UA NEGATIVE  NEGATIVE   URINE MICROSCOPIC-ADD ON     Status: Abnormal   Collection Time   01/31/12  2:20 AM      Component Value Range   Squamous Epithelial / LPF MANY (*) RARE    RBC / HPF 3-6  <3 (RBC/hpf)   Urine-Other MUCOUS PRESENT    WET PREP, GENITAL     Status: Abnormal   Collection Time   01/31/12  3:30 AM      Component Value Range   Yeast Wet Prep HPF POC NONE SEEN  NONE SEEN    Trich, Wet Prep NONE SEEN  NONE SEEN    Clue Cells Wet Prep HPF POC NONE SEEN  NONE SEEN    WBC, Wet Prep HPF POC FEW (*) NONE SEEN   CBC     Status: Abnormal   Collection Time   01/31/12  3:53 AM      Component Value Range   WBC 6.0  4.0 - 10.5 (K/uL)   RBC 3.89  3.87 - 5.11 (MIL/uL)   Hemoglobin 10.2 (*) 12.0 - 15.0 (g/dL)   HCT 32.6 (*) 36.0 - 46.0 (%)   MCV 83.8  78.0 - 100.0 (fL)   MCH 26.2  26.0 - 34.0 (pg)   MCHC 31.3  30.0 - 36.0 (g/dL)   RDW 13.9  11.5 - 15.5 (%)   Platelets 229  150 - 400 (K/uL)  DIFFERENTIAL     Status: Normal   Collection Time   01/31/12  3:53 AM      Component Value Range   Neutrophils Relative 58  43 - 77 (%)   Neutro Abs 3.4  1.7 - 7.7 (K/uL)   Lymphocytes Relative 33  12 - 46 (%)   Lymphs Abs 2.0  0.7 - 4.0 (K/uL)   Monocytes Relative 8  3 - 12 (%)   Monocytes Absolute 0.5  0.1 - 1.0 (K/uL)   Eosinophils Relative 1  0 - 5 (%)   Eosinophils Absolute 0.1  0.0 - 0.7 (K/uL)   Basophils Relative 0  0 - 1 (%)   Basophils Absolute 0.0  0.0 - 0.1 (K/uL)   US Transvaginal Non-ob  01/31/2012  *RADIOLOGY REPORT*  Clinical Data: Pelvic pain  TRANSABDOMINAL  AND TRANSVAGINAL  ULTRASOUND OF PELVIS Technique:  Both transabdominal and transvaginal ultrasound examinations of the pelvis were performed. Transabdominal technique was performed for global imaging of the pelvis including uterus, ovaries, adnexal regions, and pelvic cul-de-sac.  Comparison: 01/11/2011 CT   It was necessary to proceed with endovaginal exam following the transabdominal exam to visualize the endometrium and adnexa.  Findings:  Uterus: Enlarged, measuring 13.2 x 6.0 x 7.0 cm.  There is a dominant fibroid posteriorly measuring 6.2 x 6.0 x 6.7 cm, centered intramural however extends subserosal and intrauterine, abutting/distorting the endometrium.  Endometrium: Obscured by the large fibroid. Within the fundal portion, measures approximately 8 mm.  Right ovary:  Normal sonographic appearance, measuring 3.4 x 2.7 x 3.3 cm.  Left ovary: Not identified.  Other findings: No free fluid  IMPRESSION: Fibroid uterus.  Left ovary not identified.  Normal sonographic appearance to the right ovary.  Original Report Authenticated By: Suanne Marker, M.D.   US Pelvis Complete  01/31/2012  *RADIOLOGY REPORT*  Clinical Data: Pelvic pain  TRANSABDOMINAL AND TRANSVAGINAL ULTRASOUND OF PELVIS Technique:  Both transabdominal and transvaginal ultrasound examinations of the pelvis were performed. Transabdominal technique was performed for global imaging of the pelvis including uterus, ovaries, adnexal regions, and pelvic cul-de-sac.  Comparison: 01/11/2011 CT   It was necessary to proceed with endovaginal exam following the transabdominal exam to visualize the endometrium and adnexa.  Findings:  Uterus: Enlarged, measuring 13.2 x 6.0 x 7.0 cm.  There is a dominant fibroid posteriorly measuring 6.2 x 6.0 x 6.7 cm, centered intramural however extends subserosal and intrauterine, abutting/distorting the endometrium.  Endometrium: Obscured by the large fibroid. Within the fundal portion, measures approximately 8 mm.  Right  ovary:  Normal sonographic appearance, measuring 3.4 x 2.7 x 3.3 cm.  Left ovary: Not identified.  Other findings: No free fluid  IMPRESSION: Fibroid uterus.  Left ovary not identified.  Normal sonographic appearance to the right ovary.  Original Report Authenticated By: Suanne Marker, M.D.   CBG = 331, patient has not taken her insulin in 3 days. Has not been checking her glucose.   Assessment: Uterine fibroids   Elevated blood glucose (IDDM)   Hypertension   Nausea    Plan:  Toradol 60 mg. IM   Discussed with patient importance of checking blood glucose and taking insulin as directed   Discussed in detail importance of follow up for BP, pt. Had previously been on BP medication but stopped   MDM: Patient feeling much better after Toradol      Discharge home with Rx for Phenergan,  Anaprox DS and Percocet   Patient to call Dr. Jacelyn Grip office today for follow up   Patient to call her PCP for follow up for BP    Patient to call her endocrinologist for follow up of her diabetes       Debroah Baller, NP 02/01/12 [phone]

## 2012-01-31 NOTE — Progress Notes (Signed)
Patient states she has been having lower abdominal pain since yesterday as well as a  large amount of white foul smelling discharge. LMP 01/11/12

## 2012-02-01 LAB — GC/CHLAMYDIA PROBE AMP, GENITAL: GC Probe Amp, Genital: NEGATIVE

## 2012-02-19 ENCOUNTER — Other Ambulatory Visit: Payer: Self-pay

## 2012-02-19 ENCOUNTER — Emergency Department (HOSPITAL_COMMUNITY): Payer: Managed Care, Other (non HMO)

## 2012-02-19 ENCOUNTER — Encounter (HOSPITAL_COMMUNITY): Payer: Self-pay | Admitting: Emergency Medicine

## 2012-02-19 ENCOUNTER — Emergency Department (HOSPITAL_COMMUNITY)
Admission: EM | Admit: 2012-02-19 | Discharge: 2012-02-20 | Disposition: A | Discharge status: Home / Self Care | Payer: Managed Care, Other (non HMO) | Attending: Emergency Medicine | Admitting: Emergency Medicine

## 2012-02-19 DIAGNOSIS — R209 Unspecified disturbances of skin sensation: Secondary | ICD-10-CM | POA: Insufficient documentation

## 2012-02-19 DIAGNOSIS — Z794 Long term (current) use of insulin: Secondary | ICD-10-CM | POA: Insufficient documentation

## 2012-02-19 DIAGNOSIS — M79609 Pain in unspecified limb: Secondary | ICD-10-CM | POA: Insufficient documentation

## 2012-02-19 DIAGNOSIS — R739 Hyperglycemia, unspecified: Secondary | ICD-10-CM

## 2012-02-19 DIAGNOSIS — R079 Chest pain, unspecified: Secondary | ICD-10-CM | POA: Insufficient documentation

## 2012-02-19 DIAGNOSIS — IMO0001 Reserved for inherently not codable concepts without codable children: Secondary | ICD-10-CM | POA: Insufficient documentation

## 2012-02-19 DIAGNOSIS — S92302G Fracture of unspecified metatarsal bone(s), left foot, subsequent encounter for fracture with delayed healing: Secondary | ICD-10-CM

## 2012-02-19 DIAGNOSIS — E119 Type 2 diabetes mellitus without complications: Secondary | ICD-10-CM | POA: Insufficient documentation

## 2012-02-19 LAB — POCT I-STAT, CHEM 8
Calcium, Ion: 1.18 mmol/L (ref 1.12–1.32)
Glucose, Bld: 271 mg/dL — ABNORMAL HIGH (ref 70–99)
HCT: 31 % — ABNORMAL LOW (ref 36.0–46.0)
Hemoglobin: 10.5 g/dL — ABNORMAL LOW (ref 12.0–15.0)
Potassium: 3.9 mEq/L (ref 3.5–5.1)
TCO2: 22 mmol/L (ref 0–100)

## 2012-02-19 LAB — CBC
MCHC: 32.2 g/dL (ref 30.0–36.0)
RDW: 13.2 % (ref 11.5–15.5)

## 2012-02-19 LAB — DIFFERENTIAL
Basophils Absolute: 0 10*3/uL (ref 0.0–0.1)
Basophils Relative: 0 % (ref 0–1)
Monocytes Absolute: 0.5 10*3/uL (ref 0.1–1.0)
Neutro Abs: 3.9 10*3/uL (ref 1.7–7.7)
Neutrophils Relative %: 57 % (ref 43–77)

## 2012-02-19 LAB — GLUCOSE, CAPILLARY: Glucose-Capillary: 285 mg/dL — ABNORMAL HIGH (ref 70–99)

## 2012-02-19 MED ORDER — SODIUM CHLORIDE 0.9 % IV SOLN: INTRAVENOUS | Status: DC

## 2012-02-19 MED ORDER — HYDROMORPHONE HCL PF 1 MG/ML IJ SOLN
1.0000 mg | Freq: Once | INTRAMUSCULAR | Status: AC
  Administered 2012-02-20: 1 mg via INTRAVENOUS
  Filled 2012-02-19: qty 1

## 2012-02-19 MED ORDER — FENTANYL CITRATE 0.05 MG/ML IJ SOLN
50.0000 ug | Freq: Once | INTRAMUSCULAR | Status: AC
  Administered 2012-02-19: 50 ug via INTRAVENOUS
  Filled 2012-02-19: qty 2

## 2012-02-19 MED ORDER — SODIUM CHLORIDE 0.9 % IV BOLUS (SEPSIS)
500.0000 mL | Freq: Once | INTRAVENOUS | Status: AC
  Administered 2012-02-19: 500 mL via INTRAVENOUS

## 2012-02-19 NOTE — ED Provider Notes (Signed)
History     CSN: [id number]  Arrival date & time 02/19/12  2043   First MD Initiated Contact with Patient 02/19/12 2126      Chief Complaint  Patient presents with  . Blood Sugar Problem  . Leg Pain    (Consider location/radiation/quality/duration/timing/severity/associated sxs/prior treatment) The history is provided by the patient.   patient states she's felt bad for a while now. She states she's had cough and head congestion. She states she's had decreased appetite. Her sugars been running high at home. They were in the 400s for treatments at home. She states she's not had fevers. She has chronic nausea and gastroparesis. She also has diabetic neuropathy. She's also had increased pain in her left foot. [name] previous fractures and surgery in both of her feet. She states there was no trauma. She's also had an occasional dull chest pain. It is also started today. It is her left upper chest. She does not feel lightheaded or dizziness with it. No diaphoresis.  Past Medical History  Diagnosis Date  . Gastroparesis   . Anemia   . IBS (irritable bowel syndrome)   . Nausea   . Hx: UTI (urinary tract infection)   . Fibroids   . Fibromyalgia   . Diabetes mellitus   . Hypertension   . Chronic kidney disease     Past Surgical History  Procedure Date  . Eye surgery     bilateral  . Fibroid tumors removed   . Foot surgery     bilateral    Family History  Problem Relation Age of Onset  . Colon polyps Mother   . Anesthesia problems Neg Hx   . Hypotension Neg Hx   . Malignant hyperthermia Neg Hx   . Pseudochol deficiency Neg Hx     History  Substance Use Topics  . Smoking status: Never Smoker   . Smokeless tobacco: Not on file  . Alcohol Use: No    OB History    Grav Para Term Preterm Abortions TAB SAB Ect Mult Living   2 1   1  1   1       Review of Systems  Constitutional: Negative for activity change and appetite change.  HENT: Negative for neck stiffness.     Eyes: Negative for pain.  Respiratory: Negative for chest tightness and shortness of breath.   Cardiovascular: Positive for chest pain. Negative for leg swelling.  Gastrointestinal: Positive for nausea and abdominal pain. Negative for vomiting and diarrhea.  Genitourinary: Negative for flank pain.  Musculoskeletal: Negative for back pain.       Left foot pain  Skin: Negative for rash.  Neurological: Positive for numbness. Negative for weakness and headaches.       Patient states that she's had some numbness in her left foot. She states she always has numbness in her feet on.  Psychiatric/Behavioral: Negative for behavioral problems.    Allergies  Review of patient's allergies indicates no known allergies.  Home Medications   Current Outpatient Rx  Name Route Sig Dispense Refill  . INSULIN ISOPHANE HUMAN 100 UNIT/ML [place] SUSP Subcutaneous Inject 45 Units into the skin 2 (two) times daily before a meal.     . PREGABALIN 150 MG PO CAPS Oral Take 150 mg by mouth 2 (two) times daily.      . OXYCODONE-ACETAMINOPHEN 5-325 MG PO TABS Oral Take 1-2 tablets by mouth every 6 (six) hours as needed for pain. 20 tablet 0    BP  174/110  Pulse 94  Temp(Src) 98.6 F (37 C) (Oral)  Resp 18  Wt 237 lb (107.502 kg)  SpO2 100%  LMP 02/04/2012  Physical Exam  Nursing note and vitals reviewed. Constitutional: [name] oriented to person, place, and time. She appears well-developed and well-nourished.  HENT:  Head: Normocephalic and atraumatic.  Eyes: EOM are normal. Pupils are equal, round, and reactive to light.  Neck: Normal range of motion. Neck supple.  Cardiovascular: Normal rate, regular rhythm and normal heart sounds.   No murmur heard. Pulmonary/Chest: Effort normal and breath sounds normal. No respiratory distress. She has no wheezes. She has no rales.  Abdominal: Soft. Bowel sounds are normal. She exhibits no distension. There is no tenderness. There is no rebound and no guarding.   Musculoskeletal: Normal range of motion.       Mild tenderness to the distal left foot. It is worse over the third and fourth metatarsals. skin is intact.  Neurological: [name] alert and oriented to person, place, and time. No cranial nerve deficit.  Skin: Skin is warm and dry.  Psychiatric: She has a normal mood and affect. Her speech is normal.    ED Course  Procedures (including critical care time)  Labs Reviewed  GLUCOSE, CAPILLARY - Abnormal; Notable for the following:    Glucose-Capillary 285 (*)    All other components within normal limits  CBC - Abnormal; Notable for the following:    RBC 3.80 (*)    Hemoglobin 10.1 (*)    HCT 31.4 (*)    All other components within normal limits  POCT I-STAT, CHEM 8 - Abnormal; Notable for the following:    Creatinine, Ser 1.70 (*)    Glucose, Bld 271 (*)    Hemoglobin 10.5 (*)    HCT 31.0 (*)    All other components within normal limits  DIFFERENTIAL  POCT I-STAT TROPONIN I  URINALYSIS, ROUTINE W REFLEX MICROSCOPIC   Dg Chest 2 View  02/19/2012  *RADIOLOGY REPORT*  Clinical Data: 41 year old female with chest heaviness and cough.  CHEST - 2 VIEW  Comparison: 08/23/2011.  Findings: Stable lung volumes.  Cardiac size and mediastinal contours are within normal limits.  Visualized tracheal air column is within normal limits.  The lungs remain clear.  No effusion. Stable scoliosis. No acute osseous abnormality identified.  IMPRESSION: No acute cardiopulmonary abnormality.  Original Report Authenticated By: Randall An, M.D.   Dg Foot Complete Left  02/19/2012  *RADIOLOGY REPORT*  Clinical Data: Metatarsal pain.  No known injury.  LEFT FOOT - COMPLETE 3+ VIEW  Comparison: 11/09/2011  Findings: Healing distal third and fourth metatarsal fractures. Fracture lines remain partially evident.  Exuberant callus formation.  No acute bony abnormality.  No acute fracture, subluxation or dislocation.  Soft tissues are unremarkable.  IMPRESSION: Healing  distal third and fourth metatarsal fractures, fracture line remains partially evident.  Original Report Authenticated By: Raelyn Number, M.D.     1. Diabetes mellitus   2. Hyperglycemia   3. Fracture of metatarsal bone of left foot with delayed healing      Date: 02/20/2012  Rate: 90  Rhythm: normal sinus rhythm  QRS Axis: normal  Intervals: normal  ST/T Wave abnormalities: normal  Conduction Disutrbances:none  Narrative Interpretation:   Old EKG Reviewed: unchanged    MDM  Patient presents with left foot pain. She states she's felt bad overall also. Her diabetes is acting up. She's not been eating as much. Her sugars have been  running high, but are improved here. Repeat x-ray was done and showed a possible persistent fracture or left foot. She has a hard sole shoe and crutches at home. She'll followup either with her podiatrist or or throat. She'll be given pain medicine to deal with the pain. Her kidney function is at her baseline. At this time urinalysis is still pending        Jasper Riling. Alvino Chapel, MD 02/20/12 [phone]

## 2012-02-19 NOTE — ED Notes (Signed)
Patient c/o L foot pain. Pt does not take insulin as directed, d/t not eating, pt states she does not feel hungry.

## 2012-02-19 NOTE — ED Notes (Signed)
Patient reports intermittent sharp shooting left foot pain that began at approx. 0600 today. Hx of DM;  Patient reports not controlling blood sugars at home.

## 2012-02-20 LAB — URINALYSIS, ROUTINE W REFLEX MICROSCOPIC
Bilirubin Urine: NEGATIVE
Nitrite: NEGATIVE
Specific Gravity, Urine: 1.023 (ref 1.005–1.030)
Urobilinogen, UA: 0.2 mg/dL (ref 0.0–1.0)

## 2012-02-20 LAB — URINE MICROSCOPIC-ADD ON

## 2012-02-20 MED ORDER — OXYCODONE-ACETAMINOPHEN 5-325 MG PO TABS: 1.0000 | ORAL_TABLET | Freq: Four times a day (QID) | ORAL | Status: AC | PRN

## 2012-02-20 NOTE — Discharge Instructions (Signed)

## 2012-02-20 NOTE — ED Provider Notes (Addendum)
  Pt signed out by Dr. Alvino Chapel. U/A pending.   U/A remarkable for pro, glu, Hgb (baseline). Contaminated sample. Patient without complaints at this time. Recheck BP, discharge home.  BP 134/85  Pulse 88  Temp(Src) 97.5 F (36.4 C) (Oral)  Resp 17  Wt 237 lb (107.502 kg)  SpO2 95%  LMP 02/04/2012  Blair Heys, MD 02/20/12 0149  Blair Heys, MD 02/20/12 [phone]

## 2012-03-08 ENCOUNTER — Emergency Department (HOSPITAL_COMMUNITY)
Admission: EM | Admit: 2012-03-08 | Discharge: 2012-03-08 | Disposition: A | Discharge status: Home / Self Care | Payer: Managed Care, Other (non HMO) | Attending: Emergency Medicine | Admitting: Emergency Medicine

## 2012-03-08 ENCOUNTER — Encounter (HOSPITAL_COMMUNITY): Payer: Self-pay | Admitting: *Deleted

## 2012-03-08 DIAGNOSIS — IMO0002 Reserved for concepts with insufficient information to code with codable children: Secondary | ICD-10-CM | POA: Insufficient documentation

## 2012-03-08 DIAGNOSIS — S39012A Strain of muscle, fascia and tendon of lower back, initial encounter: Secondary | ICD-10-CM

## 2012-03-08 DIAGNOSIS — E119 Type 2 diabetes mellitus without complications: Secondary | ICD-10-CM | POA: Insufficient documentation

## 2012-03-08 DIAGNOSIS — N390 Urinary tract infection, site not specified: Secondary | ICD-10-CM | POA: Insufficient documentation

## 2012-03-08 DIAGNOSIS — Z794 Long term (current) use of insulin: Secondary | ICD-10-CM | POA: Insufficient documentation

## 2012-03-08 DIAGNOSIS — I10 Essential (primary) hypertension: Secondary | ICD-10-CM | POA: Insufficient documentation

## 2012-03-08 DIAGNOSIS — X58XXXA Exposure to other specified factors, initial encounter: Secondary | ICD-10-CM | POA: Insufficient documentation

## 2012-03-08 LAB — DIFFERENTIAL
Basophils Absolute: 0 10*3/uL (ref 0.0–0.1)
Basophils Relative: 0 % (ref 0–1)
Eosinophils Absolute: 0.1 10*3/uL (ref 0.0–0.7)
Eosinophils Relative: 1 % (ref 0–5)
Lymphs Abs: 1.9 10*3/uL (ref 0.7–4.0)
Neutrophils Relative %: 59 % (ref 43–77)

## 2012-03-08 LAB — CBC
MCH: 26.8 pg (ref 26.0–34.0)
MCHC: 32.1 g/dL (ref 30.0–36.0)
Platelets: 293 10*3/uL (ref 150–400)
RBC: 3.81 MIL/uL — ABNORMAL LOW (ref 3.87–5.11)
RDW: 13.1 % (ref 11.5–15.5)

## 2012-03-08 LAB — BASIC METABOLIC PANEL
Calcium: 8.7 mg/dL (ref 8.4–10.5)
GFR calc Af Amer: 46 mL/min — ABNORMAL LOW (ref >90)
GFR calc non Af Amer: 39 mL/min — ABNORMAL LOW (ref >90)
Potassium: 4.2 mEq/L (ref 3.5–5.1)
Sodium: 134 mEq/L — ABNORMAL LOW (ref 135–145)

## 2012-03-08 LAB — URINALYSIS, ROUTINE W REFLEX MICROSCOPIC
Bilirubin Urine: NEGATIVE
Nitrite: NEGATIVE
Protein, ur: >300 mg/dL — AB
Specific Gravity, Urine: 1.029 (ref 1.005–1.030)
Urobilinogen, UA: 0.2 mg/dL (ref 0.0–1.0)

## 2012-03-08 LAB — URINE MICROSCOPIC-ADD ON

## 2012-03-08 MED ORDER — ONDANSETRON 8 MG PO TBDP
8.0000 mg | ORAL_TABLET | Freq: Once | ORAL | Status: AC
  Administered 2012-03-08: 8 mg via ORAL
  Filled 2012-03-08: qty 1

## 2012-03-08 MED ORDER — DIAZEPAM 5 MG PO TABS: 5.0000 mg | ORAL_TABLET | Freq: Two times a day (BID) | ORAL | Status: AC

## 2012-03-08 MED ORDER — CYCLOBENZAPRINE HCL 10 MG PO TABS: 10.0000 mg | ORAL_TABLET | Freq: Two times a day (BID) | ORAL | Status: DC | PRN

## 2012-03-08 MED ORDER — OXYCODONE-ACETAMINOPHEN 5-325 MG PO TABS
2.0000 | ORAL_TABLET | Freq: Once | ORAL | Status: AC
  Administered 2012-03-08: 2 via ORAL
  Filled 2012-03-08: qty 2

## 2012-03-08 MED ORDER — DIPHENHYDRAMINE HCL 25 MG PO CAPS
25.0000 mg | ORAL_CAPSULE | Freq: Once | ORAL | Status: AC
  Administered 2012-03-08: 25 mg via ORAL
  Filled 2012-03-08: qty 1

## 2012-03-08 MED ORDER — CIPROFLOXACIN HCL 500 MG PO TABS: 500.0000 mg | ORAL_TABLET | Freq: Two times a day (BID) | ORAL | Status: AC

## 2012-03-08 MED ORDER — OXYCODONE-ACETAMINOPHEN 5-325 MG PO TABS: 1.0000 | ORAL_TABLET | Freq: Four times a day (QID) | ORAL | Status: DC | PRN

## 2012-03-08 NOTE — ED Notes (Signed)
Pt reports low back pain that started last night.  Pt reports hx of scoliosis and spina bifida.  Denies any injury

## 2012-03-08 NOTE — ED Notes (Signed)
Pt's boyfriend came to the desk reports that pt is itching.  [place] notified.  Pt medicated for itching as ordered.

## 2012-03-08 NOTE — ED Notes (Signed)
Pt ambulated to the BR with steady gait.   

## 2012-03-08 NOTE — Discharge Instructions (Signed)
Back Exercises   Back exercises help treat and prevent back injuries. The goal of back exercises is to increase the strength of your abdominal and back muscles and the flexibility of your back. These exercises should be started when you no longer have back pain. Back exercises include:   Pelvic Tilt. Lie on your back with your knees bent. Tilt your pelvis until the lower part of your back is against the floor. Hold this position 5 to 10 sec and repeat 5 to 10 times.   Knee to Chest. Pull first 1 knee up against your chest and hold for 20 to 30 seconds, repeat this with the other knee, and then both knees. This may be done with the other leg straight or bent, whichever feels better.   Sit-Ups or Curl-Ups. Bend your knees 90 degrees. Start with tilting your pelvis, and do a partial, slow sit-up, lifting your trunk only 30 to 45 degrees off the floor. Take at least 2 to 3 seconds for each sit-up. Do not do sit-ups with your knees out straight. If partial sit-ups are difficult, simply do the above but with only tightening your abdominal muscles and holding it as directed.   Hip-Lift. Lie on your back with your knees flexed 90 degrees. Push down with your feet and shoulders as you raise your hips a couple inches off the floor; hold for 10 seconds, repeat 5 to 10 times.   Back arches. Lie on your stomach, propping yourself up on bent elbows. Slowly press on your hands, causing an arch in your low back. Repeat 3 to 5 times. Any initial stiffness and discomfort should lessen with repetition over time.   Shoulder-Lifts. Lie face down with arms beside your body. Keep hips and torso pressed to floor as you slowly lift your head and shoulders off the floor.   Do not overdo your exercises, especially in the beginning. Exercises may cause you some mild back discomfort which lasts for a few minutes; however, if the pain is more severe, or lasts for more than 15 minutes, do not continue exercises until you see your caregiver.  Improvement with exercise therapy for back problems is slow.   See your caregivers for assistance with developing a proper back exercise program.   Document Released: 01/20/2005 Document Revised: 12/02/2011 Document Reviewed: 12/13/2005   Exit[employer]® Patient Information ©2012 [employer].     Back Pain, Adult   Low back pain is very common. About 1 in 5 people have back pain. The cause of low back pain is rarely dangerous. The pain often gets better over time. About half of people with a sudden onset of back pain feel better in just 2 weeks. About 8 in 10 people feel better by 6 weeks.   CAUSES   Some common causes of back pain include:   Strain of the muscles or ligaments supporting the spine.   Wear and tear (degeneration) of the spinal discs.   Arthritis.   Direct injury to the back.   DIAGNOSIS   Most of the time, the direct cause of low back pain is not known. However, back pain can be treated effectively even when the exact cause of the pain is unknown. Answering your caregiver's questions about your overall health and symptoms is one of the most accurate ways to make sure the cause of your pain is not dangerous. If your caregiver needs more information, he or she may order lab work or imaging tests (X-rays or MRIs). However, even   if imaging tests show changes in your back, this usually does not require surgery.   HOME CARE INSTRUCTIONS   For many people, back pain returns. Since low back pain is rarely dangerous, it is often a condition that people can learn to manage on their own.   Remain active. It is stressful on the back to sit or stand in one place. Do not sit, drive, or stand in one place for more than 30 minutes at a time. Take short walks on level surfaces as soon as pain allows. Try to increase the length of time you walk each day.   Do not stay in bed. Resting more than 1 or 2 days can delay your recovery.   Do not avoid exercise or work. Your body is made to move. It is not dangerous to be active,  even though your back may hurt. Your back will likely heal faster if you return to being active before your pain is gone.   Pay attention to your body when you bend and lift. Many people have less discomfort when lifting if they bend their knees, keep the load close to their bodies, and avoid twisting. Often, the most comfortable positions are those that put less stress on your recovering back.   Find a comfortable position to sleep. Use a firm mattress and lie on your side with your knees slightly bent. If you lie on your back, put a pillow under your knees.   Only take over-the-counter or prescription medicines as directed by your caregiver. Over-the-counter medicines to reduce pain and inflammation are often the most helpful. Your caregiver may prescribe muscle relaxant drugs. These medicines help dull your pain so you can more quickly return to your normal activities and healthy exercise.   Put ice on the injured area.   Put ice in a plastic bag.   Place a towel between your skin and the bag.   Leave the ice on for 15 to 20 minutes, 3 to 4 times a day for the first 2 to 3 days. After that, ice and heat may be alternated to reduce pain and spasms.   Ask your caregiver about trying back exercises and gentle massage. This may be of some benefit.   Avoid feeling anxious or stressed. Stress increases muscle tension and can worsen back pain. It is important to recognize when you are anxious or stressed and learn ways to manage it. Exercise is a great option.   SEEK MEDICAL CARE IF:   You have pain that is not relieved with rest or medicine.   You have pain that does not improve in 1 week.   You have new symptoms.   You are generally not feeling well.   SEEK IMMEDIATE MEDICAL CARE IF:   You have pain that radiates from your back into your legs.   You develop new bowel or bladder control problems.   You have unusual weakness or numbness in your arms or legs.   You develop nausea or vomiting.   You develop abdominal  pain.   You feel faint.   Document Released: 12/13/2005 Document Revised: 12/02/2011 Document Reviewed: 05/03/2011   Exit[employer]® Patient Information ©2012 [employer].

## 2012-03-08 NOTE — ED Provider Notes (Signed)
History     CSN: [id number]  Arrival date & time 03/08/12  1111   First MD Initiated Contact with Patient 03/08/12 1116      Chief Complaint  Patient presents with  . Back Pain    (Consider location/radiation/quality/duration/timing/severity/associated sxs/prior treatment) HPI  Patient presents to the emergency department with complaints of low back pain bilaterally that started last night. The patient has a history of scoliosis and spinal bifida and sees a chiropractor regularly. The patient does describe some symptoms of having her pain her left lower quadrant that is very mild and she is "unsure of if it actually hurts". She denies urinary symptoms of dysuria, foul odor, incontinence. A. no symptoms of bowel incontinence, or vaginal discharge or vaginal odor. The patient does have a history of kidney disease, thyroid, IBS, diabetic related gastroparesis. Pt denies specific injury to her back to cause pain  Past Medical History  Diagnosis Date  . Gastroparesis   . Anemia   . IBS (irritable bowel syndrome)   . Nausea   . Hx: UTI (urinary tract infection)   . Fibroids   . Fibromyalgia   . Diabetes mellitus   . Hypertension   . Chronic kidney disease     Past Surgical History  Procedure Date  . Eye surgery     bilateral  . Fibroid tumors removed   . Foot surgery     bilateral    Family History  Problem Relation Age of Onset  . Colon polyps Mother   . Anesthesia problems Neg Hx   . Hypotension Neg Hx   . Malignant hyperthermia Neg Hx   . Pseudochol deficiency Neg Hx     History  Substance Use Topics  . Smoking status: Never Smoker   . Smokeless tobacco: Not on file  . Alcohol Use: No    OB History    Grav Para Term Preterm Abortions TAB SAB Ect Mult Living   2 1   1  1   1       Review of Systems  All other systems reviewed and are negative.    Allergies  Review of patient's allergies indicates no known allergies.  Home Medications   Current  Outpatient Rx  Name Route Sig Dispense Refill  . IBUPROFEN 800 MG PO TABS Oral Take 800 mg by mouth every 8 (eight) hours as needed.    . INSULIN ISOPHANE HUMAN 100 UNIT/ML [place] SUSP Subcutaneous Inject 45 Units into the skin 2 (two) times daily before a meal.     . PREGABALIN 150 MG PO CAPS Oral Take 150 mg by mouth 2 (two) times daily.      [name] CIPROFLOXACIN HCL 500 MG PO TABS Oral Take 1 tablet (500 mg total) by mouth 2 (two) times daily. 20 tablet 0  . CYCLOBENZAPRINE HCL 10 MG PO TABS Oral Take 1 tablet (10 mg total) by mouth 2 (two) times daily as needed for muscle spasms. 20 tablet 0  . OXYCODONE-ACETAMINOPHEN 5-325 MG PO TABS Oral Take 1 tablet by mouth every 6 (six) hours as needed for pain. 15 tablet 0    BP 151/91  Pulse 89  Temp(Src) 98.5 F (36.9 C) (Oral)  Resp 20  SpO2 99%  LMP 02/04/2012  Physical Exam  Nursing note and vitals reviewed. Constitutional: She appears well-developed and well-nourished. No distress.  HENT:  Head: Normocephalic and atraumatic.  Eyes: Pupils are equal, round, and reactive to light.  Neck: Normal range of motion. Neck supple.  Cardiovascular: Normal rate and regular rhythm.   Pulmonary/Chest: Effort normal.  Abdominal: Soft. Bowel sounds are normal. She exhibits no distension and no mass. There is no tenderness. There is no rebound and no guarding.  Musculoskeletal:       Lumbar back: She exhibits decreased range of motion, tenderness, pain and spasm. She exhibits no bony tenderness, no swelling, no edema, no deformity, no laceration and normal pulse.       Back:  Neurological: [name] alert.  Skin: Skin is warm and dry.    ED Course  Procedures (including critical care time)  Labs Reviewed  URINALYSIS, ROUTINE W REFLEX MICROSCOPIC - Abnormal; Notable for the following:    Glucose, UA >1000 (*)    Hgb urine dipstick MODERATE (*)    Protein, ur >300 (*)    All other components within normal limits  CBC - Abnormal; Notable for the  following:    RBC 3.81 (*)    Hemoglobin 10.2 (*)    HCT 31.8 (*)    All other components within normal limits  BASIC METABOLIC PANEL - Abnormal; Notable for the following:    Sodium 134 (*)    Glucose, Bld 326 (*)    Creatinine, Ser 1.58 (*)    GFR calc non Af Amer 39 (*)    GFR calc Af Amer 46 (*)    All other components within normal limits  URINE MICROSCOPIC-ADD ON - Abnormal; Notable for the following:    Bacteria, UA FEW (*)    Casts HYALINE CASTS (*)    All other components within normal limits  DIFFERENTIAL   No results found.   1. Back strain   2. UTI (lower urinary tract infection)       MDM  Patients symptoms most consistent with musculoskeletal etiology as the patient has had these problems in the past. Urine has some hemoglobin, RBC's, bacteria and WBCs with a small amount of epithelial's. I will treat with abx for UTI  (Cipro) and some Valium  for pain. Will give patient Ortho referral and ask her to follow-up with her PCP regarding urine.        Linus Mako, PA 03/08/12 [place], PA 03/08/12 1442

## 2012-03-08 NOTE — ED Provider Notes (Signed)
Medical screening examination/treatment/procedure(s) were performed by non-physician practitioner and as supervising physician I was immediately available for consultation/collaboration.   Delora Fuel, MD [id number]

## 2012-05-11 ENCOUNTER — Encounter (HOSPITAL_COMMUNITY): Payer: Self-pay | Admitting: Emergency Medicine

## 2012-05-11 ENCOUNTER — Emergency Department (HOSPITAL_COMMUNITY)
Admission: EM | Admit: 2012-05-11 | Discharge: 2012-05-11 | Disposition: A | Discharge status: Home / Self Care | Payer: Managed Care, Other (non HMO) | Attending: Emergency Medicine | Admitting: Emergency Medicine

## 2012-05-11 DIAGNOSIS — N189 Chronic kidney disease, unspecified: Secondary | ICD-10-CM | POA: Insufficient documentation

## 2012-05-11 DIAGNOSIS — T25029A Burn of unspecified degree of unspecified foot, initial encounter: Secondary | ICD-10-CM

## 2012-05-11 DIAGNOSIS — IMO0001 Reserved for inherently not codable concepts without codable children: Secondary | ICD-10-CM | POA: Insufficient documentation

## 2012-05-11 DIAGNOSIS — E119 Type 2 diabetes mellitus without complications: Secondary | ICD-10-CM | POA: Insufficient documentation

## 2012-05-11 DIAGNOSIS — I129 Hypertensive chronic kidney disease with stage 1 through stage 4 chronic kidney disease, or unspecified chronic kidney disease: Secondary | ICD-10-CM | POA: Insufficient documentation

## 2012-05-11 DIAGNOSIS — M79609 Pain in unspecified limb: Secondary | ICD-10-CM | POA: Insufficient documentation

## 2012-05-11 DIAGNOSIS — X19XXXA Contact with other heat and hot substances, initial encounter: Secondary | ICD-10-CM | POA: Insufficient documentation

## 2012-05-11 DIAGNOSIS — Z794 Long term (current) use of insulin: Secondary | ICD-10-CM | POA: Insufficient documentation

## 2012-05-11 MED ORDER — HYDROCODONE-ACETAMINOPHEN 5-325 MG PO TABS: 1.0000 | ORAL_TABLET | Freq: Four times a day (QID) | ORAL | Status: AC | PRN

## 2012-05-11 MED ORDER — SILVER SULFADIAZINE 1 % EX CREA: TOPICAL_CREAM | Freq: Every day | CUTANEOUS | Status: DC

## 2012-05-11 NOTE — Discharge Instructions (Signed)
Burn Care Your skin is a natural barrier to infection. It is the largest organ of your body. Burns damage this natural protection. To help prevent infection, it is very important to follow your caregiver's instructions in the care of your burn. Burns are classified as:  First degree. There is only redness of the skin (erythema). No scarring is expected.   Second degree. There is blistering of the skin. Scarring may occur with deeper burns.   Third degree. All layers of the skin are injured, and scarring is expected.  HOME CARE INSTRUCTIONS   Wash your hands well before changing your bandage.   Change your bandage as often as directed by your caregiver.   Remove the old bandage. If the bandage sticks, you may soak it off with cool, clean water.   Cleanse the burn thoroughly but gently with mild soap and water.   Pat the area dry with a clean, dry cloth.   Apply a thin layer of antibacterial cream to the burn.   Apply a clean bandage as instructed by your caregiver.   Keep the bandage as clean and dry as possible.   Elevate the affected area for the first 24 hours, then as instructed by your caregiver.   Only take over-the-counter or prescription medicines for pain, discomfort, or fever as directed by your caregiver.  SEEK IMMEDIATE MEDICAL CARE IF:   You develop excessive pain.   You develop redness, tenderness, swelling, or red streaks near the burn.   The burned area develops yellowish-white fluid (pus) or a bad smell.   You have a fever.  MAKE SURE YOU:   Understand these instructions.   Will watch your condition.   Will get help right away if you are not doing well or get worse.  Document Released: 12/13/2005 Document Revised: 12/02/2011 Document Reviewed: 05/05/2011 [facility] Patient Information 2012 [place].

## 2012-05-11 NOTE — ED Provider Notes (Signed)
Medical screening examination/treatment/procedure(s) were performed by non-physician practitioner and as supervising physician I was immediately available for consultation/collaboration.  Virgel Manifold, MD 05/11/12 [phone]

## 2012-05-11 NOTE — ED Notes (Signed)
Pt reports walking across hot pavement yesterday barefooted and reports pain bilateral 10/10 with difficulty walking.

## 2012-05-11 NOTE — ED Provider Notes (Signed)
History     CSN: [id number]  Arrival date & time 05/11/12  1157   First MD Initiated Contact with Patient 05/11/12 1207     HPI Patient reports she had to walk over hot pavement yesterday. States this morning she woke up with large blisters and painful feet. Reports is having difficulty walking 2 to severe pain. Reports no improvement with intravenous Cipro for. Denies drainage. Denies fever. Denies worsening erythema.  Patient is a 41 y.o. female presenting with lower extremity pain. The history is provided by the patient.  Foot Pain The current episode started yesterday. The problem has been gradually worsening. Pertinent negatives include no arthralgias, chills, fever, myalgias or numbness. The symptoms are aggravated by walking and standing. She has tried rest for the symptoms. The treatment provided no relief.    Past Medical History  Diagnosis Date  . Gastroparesis   . Anemia   . IBS (irritable bowel syndrome)   . Nausea   . Hx: UTI (urinary tract infection)   . Fibroids   . Fibromyalgia   . Diabetes mellitus   . Hypertension   . Chronic kidney disease     Past Surgical History  Procedure Date  . Eye surgery     bilateral  . Fibroid tumors removed   . Foot surgery     bilateral    Family History  Problem Relation Age of Onset  . Colon polyps Mother   . Anesthesia problems Neg Hx   . Hypotension Neg Hx   . Malignant hyperthermia Neg Hx   . Pseudochol deficiency Neg Hx     History  Substance Use Topics  . Smoking status: Never Smoker   . Smokeless tobacco: Not on file  . Alcohol Use: No    OB History    Grav Para Term Preterm Abortions TAB SAB Ect Mult Living   2 1   1  1   1       Review of Systems  Constitutional: Negative for fever and chills.  Musculoskeletal: Negative for myalgias, back pain and arthralgias.       Foot pain and burns  Skin: Positive for wound.  Neurological: Negative for numbness.  All other systems reviewed and are  negative.    Allergies  Review of patient's allergies indicates no known allergies.  Home Medications   Current Outpatient Rx  Name Route Sig Dispense Refill  . IBUPROFEN 800 MG PO TABS Oral Take 800 mg by mouth every 8 (eight) hours as needed.    . INSULIN ISOPHANE HUMAN 100 UNIT/ML [place] SUSP Subcutaneous Inject 45 Units into the skin 2 (two) times daily before a meal.     . PREGABALIN 150 MG PO CAPS Oral Take 150 mg by mouth 2 (two) times daily.        BP 177/101  Pulse 92  Temp 98.3 F (36.8 C)  Resp 18  SpO2 99%  Physical Exam  Vitals reviewed. Constitutional: [name] oriented to person, place, and time. Vital signs are normal. She appears well-developed and well-nourished. No distress.  HENT:  Head: Normocephalic and atraumatic.  Eyes: Pupils are equal, round, and reactive to light.  Neck: Neck supple.  Pulmonary/Chest: Effort normal.  Musculoskeletal:       Bilateral plantar surfaces of feet have large flat blisters. TTP with surrounding erythema. No drainage. NML sensation and cap refill. Full ROm  Neurological: [name] alert and oriented to person, place, and time.  Skin: Skin is warm and dry. No  rash noted. No erythema. No pallor.  Psychiatric: She has a normal mood and affect. Her behavior is normal.    ED Course  Procedures   MDM   Will treat with analgesics and silvadene. Discussed with patient who agrees with plan and is ready for d/c       Sheliah Mends, PA-C 05/11/12 1300

## 2012-06-24 ENCOUNTER — Encounter (HOSPITAL_COMMUNITY): Payer: Self-pay | Admitting: *Deleted

## 2012-06-24 ENCOUNTER — Emergency Department (HOSPITAL_COMMUNITY)
Admission: EM | Admit: 2012-06-24 | Discharge: 2012-06-25 | Disposition: A | Discharge status: Home / Self Care | Payer: Managed Care, Other (non HMO) | Attending: Emergency Medicine | Admitting: Emergency Medicine

## 2012-06-24 DIAGNOSIS — S91103A Unspecified open wound of unspecified great toe without damage to nail, initial encounter: Secondary | ICD-10-CM

## 2012-06-24 DIAGNOSIS — X58XXXA Exposure to other specified factors, initial encounter: Secondary | ICD-10-CM | POA: Insufficient documentation

## 2012-06-24 DIAGNOSIS — K589 Irritable bowel syndrome without diarrhea: Secondary | ICD-10-CM | POA: Insufficient documentation

## 2012-06-24 DIAGNOSIS — Z794 Long term (current) use of insulin: Secondary | ICD-10-CM | POA: Insufficient documentation

## 2012-06-24 DIAGNOSIS — IMO0001 Reserved for inherently not codable concepts without codable children: Secondary | ICD-10-CM | POA: Insufficient documentation

## 2012-06-24 DIAGNOSIS — S91109A Unspecified open wound of unspecified toe(s) without damage to nail, initial encounter: Secondary | ICD-10-CM | POA: Insufficient documentation

## 2012-06-24 DIAGNOSIS — N189 Chronic kidney disease, unspecified: Secondary | ICD-10-CM | POA: Insufficient documentation

## 2012-06-24 DIAGNOSIS — E119 Type 2 diabetes mellitus without complications: Secondary | ICD-10-CM | POA: Insufficient documentation

## 2012-06-24 DIAGNOSIS — I129 Hypertensive chronic kidney disease with stage 1 through stage 4 chronic kidney disease, or unspecified chronic kidney disease: Secondary | ICD-10-CM | POA: Insufficient documentation

## 2012-06-24 NOTE — ED Notes (Signed)
Pt c/o exacerbation of non-healing foot wound that she initially injured x 1 month ago. Pt states worsening pain since past Monday that is not resolved by ibuprofen 800 mg which she took x 2 earlier this week. Pt did not follow up with PCP as recommended after initial treatment of wound.

## 2012-06-25 MED ORDER — TRAMADOL HCL 50 MG PO TABS
50.0000 mg | ORAL_TABLET | Freq: Once | ORAL | Status: AC
  Administered 2012-06-25: 50 mg via ORAL
  Filled 2012-06-25: qty 1

## 2012-06-25 MED ORDER — TRAMADOL HCL 50 MG PO TABS: 50.0000 mg | ORAL_TABLET | Freq: Four times a day (QID) | ORAL | Status: AC | PRN

## 2012-06-25 NOTE — Discharge Instructions (Signed)
You have a crack in the callous at the base of your great toe wash the area daily with soap and water apply a small amount of antibiotic ointment daily  Wear the post operative shoe until healed.  Make an appointment with Dr. Kenton Kingfisher for evaluation next week

## 2012-06-25 NOTE — ED Provider Notes (Signed)
History     CSN: [id number]  Arrival date & time 06/24/12  1924   First MD Initiated Contact with Patient 06/25/12 0102      Chief Complaint  Patient presents with  . Wound Check    (Consider location/radiation/quality/duration/timing/severity/associated sxs/prior treatment) HPI Comments: Patient has a crack in the thick callous at the base of the R great toe   Patient is a 41 y.o. female presenting with wound check. The history is provided by the patient.  Wound Check  She was treated in the ED 2 to 3 days ago. There has been no treatment since the wound repair. There has been no drainage from the wound. There is no redness present. There is no swelling present. The pain has not changed.    Past Medical History  Diagnosis Date  . Gastroparesis   . Anemia   . IBS (irritable bowel syndrome)   . Nausea   . Hx: UTI (urinary tract infection)   . Fibroids   . Fibromyalgia   . Diabetes mellitus   . Hypertension   . Chronic kidney disease     Past Surgical History  Procedure Date  . Eye surgery     bilateral  . Fibroid tumors removed   . Foot surgery     bilateral    Family History  Problem Relation Age of Onset  . Colon polyps Mother   . Anesthesia problems Neg Hx   . Hypotension Neg Hx   . Malignant hyperthermia Neg Hx   . Pseudochol deficiency Neg Hx     History  Substance Use Topics  . Smoking status: Never Smoker   . Smokeless tobacco: Not on file  . Alcohol Use: No    OB History    Grav Para Term Preterm Abortions TAB SAB Ect Mult Living   2 1   1  1   1       Review of Systems  Constitutional: Negative for fever and chills.  Musculoskeletal: Negative for joint swelling.  Skin: Positive for wound.    Allergies  Review of patient's allergies indicates no known allergies.  Home Medications   Current Outpatient Rx  Name Route Sig Dispense Refill  . IBUPROFEN 800 MG PO TABS Oral Take 800 mg by mouth every 8 (eight) hours as needed.    .  INSULIN ISOPHANE HUMAN 100 UNIT/ML [place] SUSP Subcutaneous Inject 45 Units into the skin 2 (two) times daily before a meal.     . PREGABALIN 150 MG PO CAPS Oral Take 150 mg by mouth 2 (two) times daily.      [name] SILVER SULFADIAZINE 1 % EX CREA Topical Apply topically daily. 50 g 0  . TRAMADOL HCL 50 MG PO TABS Oral Take 1 tablet (50 mg total) by mouth every 6 (six) hours as needed for pain. 10 tablet 0    BP 183/111  Pulse 104  Temp 98.4 F (36.9 C) (Oral)  Resp 20  SpO2 100%  LMP 05/28/2012  Physical Exam  Nursing note reviewed. Constitutional: She appears well-developed and well-nourished.  Cardiovascular: Normal rate.   Pulmonary/Chest: Effort normal.  Musculoskeletal: Normal range of motion.       Crack in the thick callous at the base of the R great toe   Neurological: [name] alert.  Skin: Skin is warm.    ED Course  Procedures (including critical care time)  Labs Reviewed - No data to display No results found.   1. Open wound of great  toe       MDM  Crack in callous         Garald Balding, NP 06/25/12 0242  Garald Balding, NP 06/25/12 [phone]

## 2012-06-25 NOTE — ED Notes (Signed)
Wound care done as ordered. Post op boot applied.

## 2012-06-26 NOTE — ED Provider Notes (Signed)
Medical screening examination/treatment/procedure(s) were performed by non-physician practitioner and as supervising physician I was immediately available for consultation/collaboration.  Teressa Lower, MD 06/26/12 [phone]

## 2012-08-01 ENCOUNTER — Encounter (HOSPITAL_COMMUNITY): Payer: Self-pay | Admitting: *Deleted

## 2012-08-01 ENCOUNTER — Emergency Department (HOSPITAL_COMMUNITY): Payer: Managed Care, Other (non HMO)

## 2012-08-01 ENCOUNTER — Emergency Department (HOSPITAL_COMMUNITY)
Admission: EM | Admit: 2012-08-01 | Discharge: 2012-08-01 | Disposition: A | Discharge status: Home / Self Care | Payer: Managed Care, Other (non HMO) | Attending: Emergency Medicine | Admitting: Emergency Medicine

## 2012-08-01 DIAGNOSIS — E119 Type 2 diabetes mellitus without complications: Secondary | ICD-10-CM | POA: Insufficient documentation

## 2012-08-01 DIAGNOSIS — I129 Hypertensive chronic kidney disease with stage 1 through stage 4 chronic kidney disease, or unspecified chronic kidney disease: Secondary | ICD-10-CM | POA: Insufficient documentation

## 2012-08-01 DIAGNOSIS — K589 Irritable bowel syndrome without diarrhea: Secondary | ICD-10-CM | POA: Insufficient documentation

## 2012-08-01 DIAGNOSIS — Z794 Long term (current) use of insulin: Secondary | ICD-10-CM | POA: Insufficient documentation

## 2012-08-01 DIAGNOSIS — IMO0001 Reserved for inherently not codable concepts without codable children: Secondary | ICD-10-CM | POA: Insufficient documentation

## 2012-08-01 DIAGNOSIS — R079 Chest pain, unspecified: Secondary | ICD-10-CM | POA: Insufficient documentation

## 2012-08-01 DIAGNOSIS — N189 Chronic kidney disease, unspecified: Secondary | ICD-10-CM | POA: Insufficient documentation

## 2012-08-01 LAB — BASIC METABOLIC PANEL
Calcium: 8.9 mg/dL (ref 8.4–10.5)
Chloride: 98 mEq/L (ref 96–112)
Creatinine, Ser: 1.78 mg/dL — ABNORMAL HIGH (ref 0.50–1.10)
GFR calc Af Amer: 40 mL/min — ABNORMAL LOW (ref >90)
Sodium: 131 mEq/L — ABNORMAL LOW (ref 135–145)

## 2012-08-01 LAB — CBC WITH DIFFERENTIAL/PLATELET
Basophils Absolute: 0 10*3/uL (ref 0.0–0.1)
Eosinophils Absolute: 0.1 10*3/uL (ref 0.0–0.7)
Eosinophils Relative: 1 % (ref 0–5)
HCT: 33.8 % — ABNORMAL LOW (ref 36.0–46.0)
Lymphocytes Relative: 28 % (ref 12–46)
MCH: 27.5 pg (ref 26.0–34.0)
MCV: 82.8 fL (ref 78.0–100.0)
Monocytes Absolute: 0.5 10*3/uL (ref 0.1–1.0)
Platelets: 301 10*3/uL (ref 150–400)
RDW: 13.3 % (ref 11.5–15.5)

## 2012-08-01 LAB — POCT I-STAT TROPONIN I: Troponin i, poc: 0.01 ng/mL (ref 0.00–0.08)

## 2012-08-01 MED ORDER — MORPHINE SULFATE 4 MG/ML IJ SOLN
4.0000 mg | Freq: Once | INTRAMUSCULAR | Status: AC
  Administered 2012-08-01: 4 mg via INTRAVENOUS
  Filled 2012-08-01: qty 1

## 2012-08-01 MED ORDER — OXYCODONE-ACETAMINOPHEN 5-325 MG PO TABS: 2.0000 | ORAL_TABLET | ORAL | Status: DC | PRN

## 2012-08-01 MED ORDER — ONDANSETRON HCL 4 MG/2ML IJ SOLN
4.0000 mg | Freq: Once | INTRAMUSCULAR | Status: AC
  Administered 2012-08-01: 4 mg via INTRAVENOUS
  Filled 2012-08-01: qty 2

## 2012-08-01 MED ORDER — SODIUM CHLORIDE 0.9 % IV SOLN
INTRAVENOUS | Status: DC
  Administered 2012-08-01: 19:00:00 via INTRAVENOUS

## 2012-08-01 NOTE — ED Provider Notes (Signed)
History     CSN: [id number]  Arrival date & time 08/01/12  1440   First MD Initiated Contact with Patient 08/01/12 1609      Chief Complaint  Patient presents with  . Chest Pain    (Consider location/radiation/quality/duration/timing/severity/associated sxs/prior treatment) Patient is a 41 y.o. female presenting with chest pain. The history is provided by the patient.  Chest Pain Primary symptoms include shortness of breath, cough and nausea. Pertinent negatives for primary symptoms include no fever, no palpitations, no abdominal pain and no vomiting.  Associated symptoms include diaphoresis.    41 year old, female, with a history of diabetes, and hypertension, presents emergency department with left-sided chest pain, which he describes as an ache.  Her pain has been present constantly since around 6:30 this morning.  Isn't associated with nausea, shortness of breath, nonproductive cough, and chills.  She has not had leg pain or swelling.  Her pain.  Does not radiate.  She has never had similar symptoms in the past.  She denies smoking, and denies family history of premature coronary artery disease.  Past Medical History  Diagnosis Date  . Gastroparesis   . Anemia   . IBS (irritable bowel syndrome)   . Nausea   . Hx: UTI (urinary tract infection)   . Fibroids   . Fibromyalgia   . Diabetes mellitus   . Hypertension   . Chronic kidney disease     Past Surgical History  Procedure Date  . Eye surgery     bilateral  . Fibroid tumors removed   . Foot surgery     bilateral    Family History  Problem Relation Age of Onset  . Colon polyps Mother   . Anesthesia problems Neg Hx   . Hypotension Neg Hx   . Malignant hyperthermia Neg Hx   . Pseudochol deficiency Neg Hx     History  Substance Use Topics  . Smoking status: Never Smoker   . Smokeless tobacco: Not on file  . Alcohol Use: No    OB History    Grav Para Term Preterm Abortions TAB SAB Ect Mult Living   2 1   1   1   1       Review of Systems  Constitutional: Positive for chills and diaphoresis. Negative for fever.  HENT: Negative for neck pain.   Eyes: Negative for visual disturbance.  Respiratory: Positive for cough and shortness of breath.   Cardiovascular: Positive for chest pain. Negative for palpitations and leg swelling.  Gastrointestinal: Positive for nausea. Negative for vomiting, abdominal pain and diarrhea.  Musculoskeletal: Negative for back pain.  Neurological: Negative for headaches.  Hematological: Does not bruise/bleed easily.  Psychiatric/Behavioral: Negative for confusion.  All other systems reviewed and are negative.    Allergies  Review of patient's allergies indicates no known allergies.  Home Medications   Current Outpatient Rx  Name Route Sig Dispense Refill  . IBUPROFEN 800 MG PO TABS Oral Take 800 mg by mouth every 8 (eight) hours as needed.    . INSULIN ISOPHANE HUMAN 100 UNIT/ML [place] SUSP Subcutaneous Inject 45 Units into the skin 2 (two) times daily before a meal.     . PREGABALIN 150 MG PO CAPS Oral Take 150 mg by mouth 2 (two) times daily.      [name] SILVER SULFADIAZINE 1 % EX CREA Topical Apply topically daily. 50 g 0    BP 125/81  Pulse 104  Temp 98.7 F (37.1 C) (Oral)  Resp 17  Wt 232 lb (105.235 kg)  SpO2 99%  LMP 07/29/2012  Physical Exam  Nursing note and vitals reviewed. Constitutional: [name] oriented to person, place, and time. No distress.       Morbidly obese  HENT:  Head: Normocephalic and atraumatic.  Eyes: Conjunctivae are normal. No scleral icterus.  Neck: Normal range of motion. Neck supple.  Cardiovascular: Normal rate, regular rhythm and intact distal pulses.   No murmur heard. Pulmonary/Chest: Effort normal and breath sounds normal. No respiratory distress. She has no wheezes. She has no rales. She exhibits no tenderness.  Abdominal: Soft. Bowel sounds are normal. She exhibits no distension. There is no tenderness.    Musculoskeletal: Normal range of motion. She exhibits no edema and no tenderness.  Neurological: [name] alert and oriented to person, place, and time.  Skin: Skin is warm and dry.  Psychiatric: She has a normal mood and affect. Thought content normal.    ED Course  Procedures (including critical care time) constant, chest pain, with, nonproductive cough, and shortness of breath, and chills.  Since 6:30 this morning.  Her symptoms.  Do not sound like ACS.  However, we will check laboratory testing, and a chest x-ray, for evaluation.   Labs Reviewed  CBC WITH DIFFERENTIAL  BASIC METABOLIC PANEL   No results found.   No diagnosis found.  ECG Her sinus tachycardia at 101 beats per minute. Normal axis. Normal intervals. Normal.  ST and T waves. Impression mild sinus tachycardia  MDM  Chest pain No signs acs, pneumonia. Ptx, chf.  No resp distress or hypoxia.        Barbara Cower, MD 08/01/12 [phone]

## 2012-08-01 NOTE — ED Notes (Signed)
Pt states "c/p began this morning, have had nausea & sweating"; pt indicates pain is epigastric and beneath left breast.

## 2012-08-06 ENCOUNTER — Emergency Department (HOSPITAL_COMMUNITY)
Admission: EM | Admit: 2012-08-06 | Discharge: 2012-08-06 | Disposition: A | Discharge status: Home / Self Care | Payer: Managed Care, Other (non HMO) | Attending: Emergency Medicine | Admitting: Emergency Medicine

## 2012-08-06 ENCOUNTER — Encounter (HOSPITAL_COMMUNITY): Payer: Self-pay | Admitting: Emergency Medicine

## 2012-08-06 DIAGNOSIS — K589 Irritable bowel syndrome without diarrhea: Secondary | ICD-10-CM | POA: Insufficient documentation

## 2012-08-06 DIAGNOSIS — N189 Chronic kidney disease, unspecified: Secondary | ICD-10-CM | POA: Insufficient documentation

## 2012-08-06 DIAGNOSIS — E119 Type 2 diabetes mellitus without complications: Secondary | ICD-10-CM | POA: Insufficient documentation

## 2012-08-06 DIAGNOSIS — L738 Other specified follicular disorders: Secondary | ICD-10-CM | POA: Insufficient documentation

## 2012-08-06 DIAGNOSIS — I129 Hypertensive chronic kidney disease with stage 1 through stage 4 chronic kidney disease, or unspecified chronic kidney disease: Secondary | ICD-10-CM | POA: Insufficient documentation

## 2012-08-06 DIAGNOSIS — Z794 Long term (current) use of insulin: Secondary | ICD-10-CM | POA: Insufficient documentation

## 2012-08-06 DIAGNOSIS — L02818 Cutaneous abscess of other sites: Secondary | ICD-10-CM | POA: Insufficient documentation

## 2012-08-06 DIAGNOSIS — IMO0001 Reserved for inherently not codable concepts without codable children: Secondary | ICD-10-CM | POA: Insufficient documentation

## 2012-08-06 DIAGNOSIS — L739 Follicular disorder, unspecified: Secondary | ICD-10-CM

## 2012-08-06 MED ORDER — TRAMADOL HCL 50 MG PO TABS
50.0000 mg | ORAL_TABLET | Freq: Once | ORAL | Status: AC
  Administered 2012-08-06: 50 mg via ORAL
  Filled 2012-08-06: qty 1

## 2012-08-06 MED ORDER — DOXYCYCLINE HYCLATE 100 MG PO CAPS: 100.0000 mg | ORAL_CAPSULE | Freq: Two times a day (BID) | ORAL | Status: AC

## 2012-08-06 MED ORDER — PREDNISONE 10 MG PO TABS: ORAL_TABLET | ORAL | Status: DC

## 2012-08-06 MED ORDER — MORPHINE SULFATE 4 MG/ML IJ SOLN
4.0000 mg | Freq: Once | INTRAMUSCULAR | Status: AC
  Administered 2012-08-06: 4 mg via INTRAMUSCULAR
  Filled 2012-08-06: qty 1

## 2012-08-06 NOTE — ED Notes (Signed)
Pt c/o pain to forehead and R ear, swelling noted. Oozing pustules not to frontal scalp, pt nearly tearful with pain. Pt states noted ?bites 3 days ago and swelling noted this am

## 2012-08-06 NOTE — ED Notes (Signed)
Pt presents w/ 2 pustuals at frontal hairline onsset 4 days ago. Pt thinks this is insect bite of some type. Pt now c/o facial swelling to her forehead and swelling at each ear.

## 2012-08-06 NOTE — Discharge Instructions (Signed)
Folliculitis  Folliculitis is an infection and inflammation of the hair follicles. Hair follicles become red and irritated. This inflammation is usually caused by bacteria. The bacteria thrive in warm, moist environments. This condition can be seen anywhere on the body.  CAUSES The most common cause of folliculitis is an infection by germs (bacteria). Fungal and viral infections can also cause the condition. Viral infections may be more common in people whose bodies are unable to fight disease well (weakened immune systems). Examples include people with:  AIDS.   An organ transplant.   Cancer.  People with depressed immune systems, diabetes, or obesity, have a greater risk of getting folliculitis than the general population. Certain chemicals, especially oils and tars, also can cause folliculitis. SYMPTOMS  An early sign of folliculitis is a small, white or yellow pus-filled, itchy lesion (pustule). These lesions appear on a red, inflamed follicle. They are usually less than 5 mm (.20 inches).   The most likely starting points are the scalp, thighs, legs, back and buttocks. Folliculitis is also frequently found in areas of repeated shaving.   When an infection of the follicle goes deeper, it becomes a boil or furuncle. A group of closely packed boils create a larger lesion (a carbuncle). These sores (lesions) tend to occur in hairy, sweaty areas of the body.  TREATMENT   A doctor who specializes in skin problems (dermatologists) treats mild cases of folliculitis with antiseptic washes.   They also use a skin application which kills germs (topical antibiotics). Tea tree oil is a good topical antiseptic as well. It can be found at a health food store. A small percentage of individuals may develop an allergy to the tea tree oil.   Mild to moderate boils respond well to warm water compresses applied three times daily.   In some cases, oral antibiotics should be taken with the skin treatment.     If lesions contain large quantities of pus or fluid, your caregiver may drain them. This allows the topical antibiotics to get to the affected areas better.   Stubborn cases of folliculitis may respond to laser hair removal. This process uses a high intensity light beam (a laser) to destroy the follicle and reduces the scarring from folliculitis. After laser hair removal, hair will no longer grow in the laser treated area.  Patients with long-lasting folliculitis need to find out where the infection is coming from. Germs can live in the nostrils of the patient. This can trigger an outbreak now and then. Sometimes the bacteria live in the nostrils of a family member. This person does not develop the disorder but they repeatedly re-expose others to the germ. To break the cycle of recurrence in the patient, the family member must also undergo treatment. PREVENTION   Individuals who are predisposed to folliculitis should be extremely careful about personal hygiene.   Application of antiseptic washes may help prevent recurrences.   A topical antibiotic cream, mupirocin (Bactroban), has been effective at reducing bacteria in the nostrils. It is applied inside the nose with your little finger. This is done twice daily for a week. Then it is repeated every 6 months.   Because follicle disorders tend to come back, patients must receive follow-up care. Your caregiver may be able to recognize a recurrence before it becomes severe.  SEEK IMMEDIATE MEDICAL CARE IF:   You develop redness, swelling, or increasing pain in the area.   You have a fever.   You are not improving with treatment  or are getting worse.   You have any other questions or concerns.  Document Released: 02/21/2002 Document Revised: 12/02/2011 Document Reviewed: 12/18/2008 [facility] Patient Information 2012 [place].

## 2012-08-08 NOTE — ED Provider Notes (Signed)
History     CSN: [id number]  Arrival date & time 08/06/12  1703   First MD Initiated Contact with Patient 08/06/12 1952      Chief Complaint  Patient presents with  . Insect Bite    (Consider location/radiation/quality/duration/timing/severity/associated sxs/prior treatment) HPI History from patient. 41 year old female who presents with possible insect bite to her hairline. She states that she noticed 2 pustules 4 days ago which have increased in size and have been draining purulent-appearing drainage. She has noticed mild swelling to her forehead with this and pain near her right ear with a small "knot." She denies any fever or chills. She has never had anything like this before. She denies use of any new hair products.  Past Medical History  Diagnosis Date  . Gastroparesis   . Anemia   . IBS (irritable bowel syndrome)   . Nausea   . Hx: UTI (urinary tract infection)   . Fibroids   . Fibromyalgia   . Diabetes mellitus   . Hypertension   . Chronic kidney disease     Past Surgical History  Procedure Date  . Eye surgery     bilateral  . Fibroid tumors removed   . Foot surgery     bilateral    Family History  Problem Relation Age of Onset  . Colon polyps Mother   . Anesthesia problems Neg Hx   . Hypotension Neg Hx   . Malignant hyperthermia Neg Hx   . Pseudochol deficiency Neg Hx     History  Substance Use Topics  . Smoking status: Never Smoker   . Smokeless tobacco: Not on file  . Alcohol Use: No    OB History    Grav Para Term Preterm Abortions TAB SAB Ect Mult Living   2 1   1  1   1       Review of Systems  Constitutional: Negative for fever and chills.  HENT: Negative for congestion, sore throat, trouble swallowing and neck pain.   Skin: Positive for wound.    Allergies  Review of patient's allergies indicates no known allergies.  Home Medications   Current Outpatient Rx  Name Route Sig Dispense Refill  . INSULIN ISOPHANE HUMAN 100 UNIT/ML  [place] SUSP Subcutaneous Inject 45 Units into the skin 2 (two) times daily before a meal.     . PREGABALIN 150 MG PO CAPS Oral Take 150 mg by mouth 2 (two) times daily.      [name] SILVER SULFADIAZINE 1 % EX CREA Topical Apply topically daily. 50 g 0  . DOXYCYCLINE HYCLATE 100 MG PO CAPS Oral Take 1 capsule (100 mg total) by mouth 2 (two) times daily. 20 capsule 0  . PREDNISONE 10 MG PO TABS  Take 6 tabs (60 mg) PO day one, 5 tabs day two, 4 tabs day three, 3 tabs day four, 2 tabs day five, 1 tab day six 21 tablet 0    BP 179/95  Pulse 97  Temp 98.8 F (37.1 C) (Oral)  SpO2 99%  LMP 07/29/2012  Physical Exam  Nursing note and vitals reviewed. Constitutional: She appears well-developed and well-nourished. No distress.  HENT:  Head: Normocephalic and atraumatic.         2 small pustules noted to the right frontal hairline area which are draining a small amount of purulent appearing material. Areas are tender to palpation. There is no overlying cellulitis. She has mild swelling to her forehead. She has a small approximately 1 cm preauricular  lymph node palpable on the right side. Oropharynx is clear without exudate. Right tonsil is noted to be slightly larger than left. Uvula midline. Shotty lymphadenopathy of the neck.  Eyes:       Normal appearance  Neck: Normal range of motion.  Cardiovascular: Normal rate, regular rhythm and normal heart sounds.   Pulmonary/Chest: Effort normal and breath sounds normal. She exhibits no tenderness.  Musculoskeletal: Normal range of motion.  Neurological: [name] alert.  Skin: Skin is warm and dry. [name] not diaphoretic.  Psychiatric: She has a normal mood and affect.    ED Course  Procedures (including critical care time) INCISION AND DRAINAGE Performed by: Abran Richard Consent: Verbal consent obtained. Risks and benefits: risks, benefits and alternatives were discussed Type: abscess (drainage with 18ga needle)  Body area: Right frontal  hairline  Anesthesia: local infiltration  Local anesthetic: lidocaine 2 % with epinephrine  Anesthetic total: 2 ml  Drainage: purulent  Drainage amount: scant  Packing material: none  Patient tolerance: Patient tolerated the procedure well with no immediate complications.   Labs Reviewed - No data to display No results found.   1. Folliculitis       MDM  Patient with what appears to be possible folliculitis to the hairline. She has 2 small lesions which are draining purulent-appearing drainage and appears to have a localized lymphadenopathy likely coming from this with some mild forehead swelling. The area was injected with a small amount of lidocaine and needle drainage was performed with an 18-gauge needle. This was productive of purulent drainage. Given the forehead swelling, will start the patient on a prednisone pack and doxycycline for infectious coverage. Patient was instructed to followup with her primary care Dr. for recheck. Reasons to return discussed.       Abran Richard, PA-C 08/08/12 1424

## 2012-08-09 NOTE — ED Provider Notes (Signed)
Medical screening examination/treatment/procedure(s) were performed by non-physician practitioner and as supervising physician I was immediately available for consultation/collaboration. Rolland Porter, MD, Abram Sander   [name], MD 08/09/12 [phone]

## 2012-08-14 LAB — ALBUMIN, URINE, RANDOM
Creatinine Random, Urine: 50.2
Microalbumin, Urine: 968.4

## 2012-08-14 LAB — BASIC METABOLIC PANEL
BUN: 19 mg/dL (ref 4–21)
Calcium: 8.5 mg/dL
Carbon Disulfide: 22
Chloride: 97 mmol/L
Creat: 1.67
GFR, Est African American: 43
Glucose: 520

## 2012-09-27 LAB — BASIC METABOLIC PANEL
BUN/Creatinine Ratio: 16
Calcium: 8.3 mg/dL
Carbon Disulfide: 20
Chloride: 104 mmol/L
Creat: 2.06
GFR, Est African American: 34
Glucose: 340
Sodium: 139 mmol/L (ref 137–147)

## 2012-10-18 LAB — CHOLESTEROL, TOTAL
ALT: 24 U/L (ref 7–35)
AST: 18 U/L
Cholesterol, Total: 190
HDL: 49 mg/dL (ref 35–70)
LDL (calc): 103
Triglycerides: 191
VLDL Cholesterol Cal: 38

## 2012-11-21 ENCOUNTER — Encounter (HOSPITAL_COMMUNITY): Payer: Self-pay | Admitting: *Deleted

## 2012-11-21 ENCOUNTER — Emergency Department (HOSPITAL_COMMUNITY)
Admission: EM | Admit: 2012-11-21 | Discharge: 2012-11-22 | Disposition: A | Discharge status: Home / Self Care | Payer: Managed Care, Other (non HMO) | Attending: Emergency Medicine | Admitting: Emergency Medicine

## 2012-11-21 DIAGNOSIS — M545 Low back pain, unspecified: Secondary | ICD-10-CM | POA: Insufficient documentation

## 2012-11-21 DIAGNOSIS — Z8739 Personal history of other diseases of the musculoskeletal system and connective tissue: Secondary | ICD-10-CM | POA: Insufficient documentation

## 2012-11-21 DIAGNOSIS — Z862 Personal history of diseases of the blood and blood-forming organs and certain disorders involving the immune mechanism: Secondary | ICD-10-CM | POA: Insufficient documentation

## 2012-11-21 DIAGNOSIS — R112 Nausea with vomiting, unspecified: Secondary | ICD-10-CM

## 2012-11-21 DIAGNOSIS — I129 Hypertensive chronic kidney disease with stage 1 through stage 4 chronic kidney disease, or unspecified chronic kidney disease: Secondary | ICD-10-CM | POA: Insufficient documentation

## 2012-11-21 DIAGNOSIS — Z8719 Personal history of other diseases of the digestive system: Secondary | ICD-10-CM | POA: Insufficient documentation

## 2012-11-21 DIAGNOSIS — R109 Unspecified abdominal pain: Secondary | ICD-10-CM | POA: Insufficient documentation

## 2012-11-21 DIAGNOSIS — E119 Type 2 diabetes mellitus without complications: Secondary | ICD-10-CM | POA: Insufficient documentation

## 2012-11-21 DIAGNOSIS — Z794 Long term (current) use of insulin: Secondary | ICD-10-CM | POA: Insufficient documentation

## 2012-11-21 DIAGNOSIS — Z87440 Personal history of urinary (tract) infections: Secondary | ICD-10-CM | POA: Insufficient documentation

## 2012-11-21 DIAGNOSIS — N189 Chronic kidney disease, unspecified: Secondary | ICD-10-CM | POA: Insufficient documentation

## 2012-11-21 LAB — CBC WITH DIFFERENTIAL/PLATELET
Basophils Absolute: 0 10*3/uL (ref 0.0–0.1)
Basophils Relative: 0 % (ref 0–1)
Eosinophils Absolute: 0.1 10*3/uL (ref 0.0–0.7)
Eosinophils Relative: 1 % (ref 0–5)
HCT: 29.1 % — ABNORMAL LOW (ref 36.0–46.0)
Hemoglobin: 9.6 g/dL — ABNORMAL LOW (ref 12.0–15.0)
Lymphocytes Relative: 31 % (ref 12–46)
Lymphs Abs: 2 10*3/uL (ref 0.7–4.0)
MCH: 27.7 pg (ref 26.0–34.0)
MCHC: 33 g/dL (ref 30.0–36.0)
MCV: 84.1 fL (ref 78.0–100.0)
Monocytes Absolute: 0.6 10*3/uL (ref 0.1–1.0)
Monocytes Relative: 9 % (ref 3–12)
Neutro Abs: 3.8 10*3/uL (ref 1.7–7.7)
Neutrophils Relative %: 59 % (ref 43–77)
Platelets: 269 10*3/uL (ref 150–400)
RBC: 3.46 MIL/uL — ABNORMAL LOW (ref 3.87–5.11)
RDW: 13 % (ref 11.5–15.5)
WBC: 6.5 10*3/uL (ref 4.0–10.5)

## 2012-11-21 LAB — POCT PREGNANCY, URINE: Preg Test, Ur: NEGATIVE

## 2012-11-21 LAB — COMPREHENSIVE METABOLIC PANEL
ALT: 17 U/L (ref 0–35)
AST: 14 U/L (ref 0–37)
Albumin: 2.4 g/dL — ABNORMAL LOW (ref 3.5–5.2)
Alkaline Phosphatase: 77 U/L (ref 39–117)
BUN: 14 mg/dL (ref 6–23)
CO2: 22 mEq/L (ref 19–32)
Calcium: 8.4 mg/dL (ref 8.4–10.5)
Chloride: 103 mEq/L (ref 96–112)
Creatinine, Ser: 1.47 mg/dL — ABNORMAL HIGH (ref 0.50–1.10)
GFR calc Af Amer: 50 mL/min — ABNORMAL LOW (ref >90)
GFR calc non Af Amer: 43 mL/min — ABNORMAL LOW (ref >90)
Glucose, Bld: 286 mg/dL — ABNORMAL HIGH (ref 70–99)
Potassium: 3.8 mEq/L (ref 3.5–5.1)
Sodium: 134 mEq/L — ABNORMAL LOW (ref 135–145)
Total Bilirubin: 0.3 mg/dL (ref 0.3–1.2)
Total Protein: 6.4 g/dL (ref 6.0–8.3)

## 2012-11-21 LAB — LIPASE, BLOOD: Lipase: 38 U/L (ref 11–59)

## 2012-11-21 MED ORDER — SODIUM CHLORIDE 0.9 % IV BOLUS (SEPSIS)
1000.0000 mL | Freq: Once | INTRAVENOUS | Status: AC
  Administered 2012-11-21: 1000 mL via INTRAVENOUS

## 2012-11-21 MED ORDER — ONDANSETRON HCL 4 MG/2ML IJ SOLN
4.0000 mg | Freq: Once | INTRAMUSCULAR | Status: AC
  Administered 2012-11-21: 4 mg via INTRAVENOUS
  Filled 2012-11-21: qty 2

## 2012-11-21 MED ORDER — HYDROMORPHONE HCL PF 1 MG/ML IJ SOLN
1.0000 mg | Freq: Once | INTRAMUSCULAR | Status: AC
  Administered 2012-11-21: 1 mg via INTRAVENOUS
  Filled 2012-11-21: qty 1

## 2012-11-21 MED ORDER — ONDANSETRON HCL 4 MG PO TABS: 4.0000 mg | ORAL_TABLET | Freq: Four times a day (QID) | ORAL | Status: DC

## 2012-11-21 NOTE — ED Notes (Signed)
Pt c/o feeling bad for a few days; vomiting today; no diarrhea

## 2012-11-21 NOTE — ED Notes (Signed)
MD at bedside. 

## 2012-11-21 NOTE — ED Provider Notes (Signed)
History    41 year old female with nausea and vomiting since yesterday. Nonbilious nonbloody. 3 episodes in total. Patient is also complaining of some crampy lower abdominal and lower back pain. No appreciable exacerbating relieving factors. Patient has history of diabetes and did not take her insulin this morning because she is not eating anything today. She reports decreased urinary output, otherwise no urinary complaints. No unusual vaginal bleeding or discharge. No diarrhea. No sick contacts. No fever.   CSN: [id number]  Arrival date & time 11/21/12  2051   First MD Initiated Contact with Patient 11/21/12 2200      Chief Complaint  Patient presents with  . Emesis    (Consider location/radiation/quality/duration/timing/severity/associated sxs/prior treatment) HPI  Past Medical History  Diagnosis Date  . Gastroparesis   . Anemia   . IBS (irritable bowel syndrome)   . Nausea   . Hx: UTI (urinary tract infection)   . Fibroids   . Fibromyalgia   . Diabetes mellitus   . Hypertension   . Chronic kidney disease     Past Surgical History  Procedure Date  . Eye surgery     bilateral  . Fibroid tumors removed   . Foot surgery     bilateral    Family History  Problem Relation Age of Onset  . Colon polyps Mother   . Anesthesia problems Neg Hx   . Hypotension Neg Hx   . Malignant hyperthermia Neg Hx   . Pseudochol deficiency Neg Hx     History  Substance Use Topics  . Smoking status: Never Smoker   . Smokeless tobacco: Not on file  . Alcohol Use: No    OB History    Grav Para Term Preterm Abortions TAB SAB Ect Mult Living   2 1   1  1   1       Review of Systems   Review of symptoms negative unless otherwise noted in HPI.   Allergies  Review of patient's allergies indicates no known allergies.  Home Medications   Current Outpatient Rx  Name  Route  Sig  Dispense  Refill  . INSULIN ISOPHANE HUMAN 100 UNIT/ML [place] SUSP   Subcutaneous   Inject 35 Units  into the skin 2 (two) times daily before a meal.          . PREDNISONE 10 MG PO TABS      Take 6 tabs (60 mg) PO day one, 5 tabs day two, 4 tabs day three, 3 tabs day four, 2 tabs day five, 1 tab day six   21 tablet   0   . PREGABALIN 150 MG PO CAPS   Oral   Take 150 mg by mouth 2 (two) times daily.             BP 149/85  Pulse 78  Temp 98.1 F (36.7 C) (Oral)  Resp 20  SpO2 98%  Physical Exam  Nursing note and vitals reviewed. Constitutional: She appears well-developed and well-nourished. No distress.       Laying in bed. No acute distress. Obese.  HENT:  Head: Normocephalic and atraumatic.  Eyes: Conjunctivae normal are normal. Right eye exhibits no discharge. Left eye exhibits no discharge.  Neck: Neck supple.  Cardiovascular: Normal rate, regular rhythm and normal heart sounds.  Exam reveals no gallop and no friction rub.   No murmur heard. Pulmonary/Chest: Effort normal and breath sounds normal. No respiratory distress.  Abdominal: Soft. She exhibits no distension and no mass. There is  no tenderness. There is no guarding.  Genitourinary:       No costovertebral angle tenderness  Musculoskeletal: She exhibits no edema and no tenderness.  Neurological: [name] alert.  Skin: Skin is warm and dry. [name] not diaphoretic.  Psychiatric: She has a normal mood and affect. Her behavior is normal. Thought content normal.    ED Course  Procedures (including critical care time)  Labs Reviewed  GLUCOSE, CAPILLARY - Abnormal; Notable for the following:    Glucose-Capillary 267 (*)     All other components within normal limits  CBC WITH DIFFERENTIAL - Abnormal; Notable for the following:    RBC 3.46 (*)     Hemoglobin 9.6 (*)     HCT 29.1 (*)     All other components within normal limits  COMPREHENSIVE METABOLIC PANEL - Abnormal; Notable for the following:    Sodium 134 (*)     Glucose, Bld 286 (*)     Creatinine, Ser 1.47 (*)     Albumin 2.4 (*)     GFR calc non Af  Amer 43 (*)     GFR calc Af Amer 50 (*)     All other components within normal limits  URINALYSIS, MICROSCOPIC ONLY - Abnormal; Notable for the following:    APPearance CLOUDY (*)     Glucose, UA >1000 (*)     Hgb urine dipstick SMALL (*)     Protein, ur >300 (*)     Bacteria, UA FEW (*)     Casts GRANULAR CAST (*)     All other components within normal limits  LIPASE, BLOOD  POCT PREGNANCY, URINE  LAB REPORT - SCANNED   No results found.   1. Nausea and vomiting       MDM  41 year old female with abdominal and back pain and nausea and vomiting since yesterday. Suspect viral illness. Patient has a benign abdominal exam. [name] afebrile. She has a history of diabetes and is hyperglycemic but has a normal anion gap and no metabolic acidosis. Patient has baseline renal insufficiency. This is actually slightly improved in comparison to previous labs. She was treated with IV fluids and anti-medics with improvement of her symptoms. UA not consistent with infection. Plan symptomatic tx.      Virgel Manifold, MD 11/23/12 [phone]

## 2012-11-21 NOTE — ED Notes (Signed)
CBG 267 

## 2012-11-21 NOTE — ED Notes (Addendum)
Pt states "I have been told I am in kidney failure because of my diabetes.  I realized I didn't pee yesterday and I only have x3 today.  My stomach and back hurts on both sides.  I haven't been able to eat or drink. I vomited this yellow color stuff this morning at work.  I haven't taken my blood sugar today, but I take 35u of Novolin every morning and night, but I didn't take it this morning."

## 2012-11-22 LAB — URINALYSIS, MICROSCOPIC ONLY
Bilirubin Urine: NEGATIVE
Glucose, UA: >1000 mg/dL — AB
Ketones, ur: NEGATIVE mg/dL
Leukocytes, UA: NEGATIVE
Nitrite: NEGATIVE
Protein, ur: >300 mg/dL — AB
Specific Gravity, Urine: 1.024 (ref 1.005–1.030)
Urobilinogen, UA: 0.2 mg/dL (ref 0.0–1.0)
pH: 5.5 (ref 5.0–8.0)

## 2012-11-22 MED ORDER — SODIUM CHLORIDE 0.9 % IV SOLN
Freq: Once | INTRAVENOUS | Status: AC
  Administered 2012-11-22: 20 mL/h via INTRAVENOUS

## 2012-11-22 MED ORDER — DIPHENHYDRAMINE HCL 50 MG/ML IJ SOLN
25.0000 mg | Freq: Once | INTRAMUSCULAR | Status: AC
  Administered 2012-11-22: 25 mg via INTRAVENOUS
  Filled 2012-11-22: qty 1

## 2012-12-13 ENCOUNTER — Encounter (HOSPITAL_COMMUNITY): Payer: Self-pay | Admitting: Emergency Medicine

## 2012-12-13 ENCOUNTER — Emergency Department (HOSPITAL_COMMUNITY)
Admission: EM | Admit: 2012-12-13 | Discharge: 2012-12-13 | Disposition: A | Discharge status: Home / Self Care | Payer: Managed Care, Other (non HMO) | Attending: Emergency Medicine | Admitting: Emergency Medicine

## 2012-12-13 ENCOUNTER — Emergency Department (HOSPITAL_COMMUNITY): Payer: Managed Care, Other (non HMO)

## 2012-12-13 DIAGNOSIS — R059 Cough, unspecified: Secondary | ICD-10-CM | POA: Insufficient documentation

## 2012-12-13 DIAGNOSIS — R05 Cough: Secondary | ICD-10-CM | POA: Insufficient documentation

## 2012-12-13 DIAGNOSIS — I129 Hypertensive chronic kidney disease with stage 1 through stage 4 chronic kidney disease, or unspecified chronic kidney disease: Secondary | ICD-10-CM | POA: Insufficient documentation

## 2012-12-13 DIAGNOSIS — R0602 Shortness of breath: Secondary | ICD-10-CM | POA: Insufficient documentation

## 2012-12-13 DIAGNOSIS — Z8701 Personal history of pneumonia (recurrent): Secondary | ICD-10-CM | POA: Insufficient documentation

## 2012-12-13 DIAGNOSIS — N189 Chronic kidney disease, unspecified: Secondary | ICD-10-CM | POA: Insufficient documentation

## 2012-12-13 DIAGNOSIS — IMO0001 Reserved for inherently not codable concepts without codable children: Secondary | ICD-10-CM | POA: Insufficient documentation

## 2012-12-13 DIAGNOSIS — Z79899 Other long term (current) drug therapy: Secondary | ICD-10-CM | POA: Insufficient documentation

## 2012-12-13 DIAGNOSIS — Z8719 Personal history of other diseases of the digestive system: Secondary | ICD-10-CM | POA: Insufficient documentation

## 2012-12-13 DIAGNOSIS — E119 Type 2 diabetes mellitus without complications: Secondary | ICD-10-CM | POA: Insufficient documentation

## 2012-12-13 DIAGNOSIS — Z862 Personal history of diseases of the blood and blood-forming organs and certain disorders involving the immune mechanism: Secondary | ICD-10-CM | POA: Insufficient documentation

## 2012-12-13 DIAGNOSIS — J40 Bronchitis, not specified as acute or chronic: Secondary | ICD-10-CM | POA: Insufficient documentation

## 2012-12-13 DIAGNOSIS — R112 Nausea with vomiting, unspecified: Secondary | ICD-10-CM | POA: Insufficient documentation

## 2012-12-13 DIAGNOSIS — Z794 Long term (current) use of insulin: Secondary | ICD-10-CM | POA: Insufficient documentation

## 2012-12-13 DIAGNOSIS — Z87440 Personal history of urinary (tract) infections: Secondary | ICD-10-CM | POA: Insufficient documentation

## 2012-12-13 LAB — GLUCOSE, CAPILLARY
Glucose-Capillary: 322 mg/dL — ABNORMAL HIGH (ref 70–99)
Glucose-Capillary: 241 mg/dL — ABNORMAL HIGH (ref 70–99)

## 2012-12-13 LAB — COMPREHENSIVE METABOLIC PANEL
Albumin: 2.4 g/dL — ABNORMAL LOW (ref 3.5–5.2)
BUN: 17 mg/dL (ref 6–23)
Creatinine, Ser: 1.66 mg/dL — ABNORMAL HIGH (ref 0.50–1.10)
Total Protein: 7.8 g/dL (ref 6.0–8.3)

## 2012-12-13 LAB — CBC WITH DIFFERENTIAL/PLATELET
Basophils Relative: 0 % (ref 0–1)
Eosinophils Absolute: 0 10*3/uL (ref 0.0–0.7)
Eosinophils Relative: 0 % (ref 0–5)
Hemoglobin: 10.5 g/dL — ABNORMAL LOW (ref 12.0–15.0)
MCH: 27.1 pg (ref 26.0–34.0)
MCHC: 32.9 g/dL (ref 30.0–36.0)
Monocytes Relative: 7 % (ref 3–12)
Neutrophils Relative %: 77 % (ref 43–77)

## 2012-12-13 LAB — LIPASE, BLOOD: Lipase: 24 U/L (ref 11–59)

## 2012-12-13 MED ORDER — AEROCHAMBER Z-STAT PLUS/MEDIUM MISC
1.0000 | Freq: Once | Status: AC
  Administered 2012-12-13: 1

## 2012-12-13 MED ORDER — ONDANSETRON 8 MG PO TBDP: 8.0000 mg | ORAL_TABLET | Freq: Three times a day (TID) | ORAL | Status: DC | PRN

## 2012-12-13 MED ORDER — HYDROCOD POLST-CHLORPHEN POLST 10-8 MG/5ML PO LQCR: 5.0000 mL | Freq: Two times a day (BID) | ORAL | Status: DC | PRN

## 2012-12-13 MED ORDER — SODIUM CHLORIDE 0.9 % IV BOLUS (SEPSIS)
1000.0000 mL | Freq: Once | INTRAVENOUS | Status: AC
  Administered 2012-12-13: 1000 mL via INTRAVENOUS

## 2012-12-13 MED ORDER — MORPHINE SULFATE 4 MG/ML IJ SOLN
4.0000 mg | Freq: Once | INTRAMUSCULAR | Status: AC
  Administered 2012-12-13: 4 mg via INTRAVENOUS
  Filled 2012-12-13: qty 1

## 2012-12-13 MED ORDER — HYDROCOD POLST-CHLORPHEN POLST 10-8 MG/5ML PO LQCR
5.0000 mL | Freq: Once | ORAL | Status: AC
  Administered 2012-12-13: 5 mL via ORAL
  Filled 2012-12-13 (×2): qty 5

## 2012-12-13 MED ORDER — DIPHENHYDRAMINE HCL 50 MG/ML IJ SOLN
12.5000 mg | Freq: Once | INTRAMUSCULAR | Status: AC
  Administered 2012-12-13: 12.5 mg via INTRAVENOUS
  Filled 2012-12-13: qty 1

## 2012-12-13 MED ORDER — ONDANSETRON HCL 4 MG/2ML IJ SOLN
4.0000 mg | Freq: Once | INTRAMUSCULAR | Status: AC
  Administered 2012-12-13: 4 mg via INTRAVENOUS
  Filled 2012-12-13 (×2): qty 2

## 2012-12-13 MED ORDER — ALBUTEROL SULFATE HFA 108 (90 BASE) MCG/ACT IN AERS
2.0000 | INHALATION_SPRAY | RESPIRATORY_TRACT | Status: DC | PRN
  Administered 2012-12-13: 2 via RESPIRATORY_TRACT
  Filled 2012-12-13: qty 6.7

## 2012-12-13 NOTE — ED Notes (Signed)
PA @ bedside.

## 2012-12-13 NOTE — ED Provider Notes (Signed)
History     CSN: [id number]  Arrival date & time 12/13/12  1145   First MD Initiated Contact with Patient 12/13/12 1237      Chief Complaint  Patient presents with  . Chest Pain    (Consider location/radiation/quality/duration/timing/severity/associated sxs/prior treatment) HPI Comments: Patient presents with complaint of nausea and vomiting for the past 2 weeks. This has been associated with cough and shortness of breath. She's had some chest tightness with coughing. She states that she has had pneumonia this year. She also has a history of insulin-dependent diabetes and she has not been taking her insulin for the past several days. She has a history of gastroparesis. No fevers or upper respiratory tract infection symptoms. No urinary problems or changes. Onset gradual. Course is constant. Nothing makes symptoms better or worse.  The history is provided by the patient.    Past Medical History  Diagnosis Date  . Gastroparesis   . Anemia   . IBS (irritable bowel syndrome)   . Nausea   . Hx: UTI (urinary tract infection)   . Fibroids   . Fibromyalgia   . Diabetes mellitus   . Hypertension   . Chronic kidney disease     Past Surgical History  Procedure Date  . Eye surgery     bilateral  . Fibroid tumors removed   . Foot surgery     bilateral    Family History  Problem Relation Age of Onset  . Colon polyps Mother   . Anesthesia problems Neg Hx   . Hypotension Neg Hx   . Malignant hyperthermia Neg Hx   . Pseudochol deficiency Neg Hx     History  Substance Use Topics  . Smoking status: Never Smoker   . Smokeless tobacco: Not on file  . Alcohol Use: No    OB History    Grav Para Term Preterm Abortions TAB SAB Ect Mult Living   2 1   1  1   1       Review of Systems  Constitutional: Negative for fever.  HENT: Negative for sore throat and rhinorrhea.   Eyes: Negative for redness.  Respiratory: Positive for cough, chest tightness and shortness of breath.    Cardiovascular: Negative for chest pain.  Gastrointestinal: Positive for nausea and vomiting. Negative for abdominal pain and diarrhea.  Genitourinary: Negative for dysuria.  Musculoskeletal: Negative for myalgias.  Skin: Negative for rash.  Neurological: Negative for headaches.    Allergies  Dilaudid  Home Medications   Current Outpatient Rx  Name  Route  Sig  Dispense  Refill  . INSULIN ISOPHANE HUMAN 100 UNIT/ML [place] SUSP   Subcutaneous   Inject 35 Units into the skin 2 (two) times daily before a meal.          . PREGABALIN 150 MG PO CAPS   Oral   Take 150 mg by mouth 2 (two) times daily.             BP 149/81  Pulse 107  Temp 98.3 F (36.8 C) (Oral)  Resp 16  SpO2 95%  LMP 12/05/2012  Physical Exam  Nursing note and vitals reviewed. Constitutional: She appears well-developed and well-nourished.  HENT:  Head: Normocephalic and atraumatic.  Mouth/Throat: Uvula is midline and oropharynx is clear and moist. Mucous membranes are dry.  Eyes: Conjunctivae normal are normal. Right eye exhibits no discharge. Left eye exhibits no discharge.  Neck: Normal range of motion. Neck supple.  Cardiovascular: Regular rhythm and normal heart  sounds.  Tachycardia present.        Mild tachycardia  Pulmonary/Chest: Effort normal. No accessory muscle usage. She has rhonchi (scattered, clears with cough).       Diffuse chest wall tenderness on exam, reproduced with palpation.   Abdominal: Soft. There is no tenderness.  Neurological: [name] alert.  Skin: Skin is warm and dry.  Psychiatric: She has a normal mood and affect.    ED Course  Procedures (including critical care time)  Labs Reviewed  CBC WITH DIFFERENTIAL - Abnormal; Notable for the following:    WBC 11.2 (*)     Hemoglobin 10.5 (*)     HCT 31.9 (*)     Neutro Abs 8.7 (*)     All other components within normal limits  COMPREHENSIVE METABOLIC PANEL - Abnormal; Notable for the following:    Sodium 130 (*)      Chloride 95 (*)     Glucose, Bld 382 (*)     Creatinine, Ser 1.66 (*)     Albumin 2.4 (*)     Alkaline Phosphatase 118 (*)     GFR calc non Af Amer 37 (*)     GFR calc Af Amer 43 (*)     All other components within normal limits  GLUCOSE, CAPILLARY - Abnormal; Notable for the following:    Glucose-Capillary 322 (*)     All other components within normal limits  GLUCOSE, CAPILLARY - Abnormal; Notable for the following:    Glucose-Capillary 241 (*)     All other components within normal limits  LIPASE, BLOOD   Dg Chest 2 View  12/13/2012  *RADIOLOGY REPORT*  Clinical Data: Cough, chest pain, weakness, history hypertension, diabetes, fibromyalgia  CHEST - 2 VIEW  Comparison: 08/01/2012  Findings: Borderline enlargement of cardiac silhouette. Mediastinal contours normal. Minimal pulmonary vascular congestion. Decreased lung volumes without infiltrate, pleural effusion or pneumothorax. No acute osseous findings.  IMPRESSION: Decreased lung volumes.   Original Report Authenticated By: Lavonia Dana, M.D.      1. Nausea and vomiting   2. Bronchitis     12:57 PM Patient seen and examined. Work-up initiated.  D/w Dr. Audie Pinto.   Vital signs reviewed and are as follows: Filed Vitals:   12/13/12 1158  BP: 149/81  Pulse: 107  Temp: 98.3 F (36.8 C)  Resp: 16    Date: 12/13/2012  Rate: 108  Rhythm: sinus tachycardia  QRS Axis: normal  Intervals: normal  ST/T Wave abnormalities: normal  Conduction Disutrbances:none  Narrative Interpretation:   Old EKG Reviewed: unchanged from 08/01/2012  No evidence of DKA. Nursing and IV nurse unable to get peripheral IV access. IV started under ultrasound guidance by Dr. Audie Pinto. Fluids given.   7:26 PM CBG improved. Patient was given medication for cough earlier.   7:53 PM Patient tolerating PO's. She has not vomited in the ED at all.  Will d/c to home with inhaler, medication for cough. Counseled patient that is it important that she continues to  take her insulin. Zofran given for N/V.   Patient counseled on use of albuterol HFA.  Told to use 1-2 puffs q 4 hours as needed for SOB.  Patient urged to return with worsening symptoms, high persistent fever, worsening SOB or work of breathing or other concerns.   8:23 PM Pt has tolerated cracker and a cup of water. Will d/c to home.   BP 150/83  Pulse 100  Temp 98.2 F (36.8 C) (Oral)  Resp 22  SpO2 96%  LMP 12/05/2012    MDM  Cough, chest pain: CP is musculoskeletal. CXR shows no PNA or edema. Will treat as bronchitis. Do not suspect PE, CAD.   N/V: elevated CBG without DKA treated with fluids. Patient encouraged to take her insulin as prescribed. There is likely an element of gastroparesis -- however patient has tolerated PO's in ED and has not vomited here.   At this point, will treat each of these problems symptomatically. There are no current indications for admission. Patient appropriate for treatment at home.         [place], [place] 12/14/12 [phone]

## 2012-12-13 NOTE — ED Provider Notes (Signed)
Ultrasound guided Angiocath Left antecubital space Sterile technique done 20-gauge catheter Insertion via ultrasound guidance. Complications: None  Dot Lanes, MD 12/13/12 [phone]

## 2012-12-13 NOTE — ED Notes (Signed)
Attempted IV access, pt is a difficult stick.  IV rn notified.

## 2012-12-13 NOTE — ED Notes (Addendum)
PER EMS- pt picked up from work with c/o chest discomfort x3 days, cough x2 weeks, and SOB.  Pt has hx of pneumonia x2 this year.  Pt is DM and reports not taking insulin x2 days.  CBG 429.  Pt also reports not taking BP medication this morning.  EMS also reports bilateral rhonchi lung sounds and chest pain radiates to L arm.

## 2012-12-13 NOTE — ED Notes (Signed)
PA Josh bedside

## 2012-12-13 NOTE — ED Notes (Signed)
pts CBG was 241

## 2012-12-14 NOTE — ED Provider Notes (Signed)
Medical screening examination/treatment/procedure(s) were conducted as a shared visit with non-physician practitioner(s) and myself.  I personally evaluated the patient during the encounter    Dot Lanes, MD 12/14/12 2255

## 2013-01-03 LAB — BASIC METABOLIC PANEL
BUN/Creatinine Ratio: 11
BUN: 18 mg/dL (ref 4–21)
Calcium: 8.4 mg/dL
Carbon Disulfide: 23
Chloride: 97 mmol/L
Creat: 1.61
Sodium: 133 mmol/L — AB (ref 137–147)

## 2013-01-12 ENCOUNTER — Other Ambulatory Visit: Payer: Self-pay | Admitting: Nephrology

## 2013-01-12 LAB — ANCA TITERS
ANCA Proteinase 3: <3.5
ASO: 97.4
Alpha-1-Globulin: 0.2
Atypical pANCA: <1:20 {titer}
Beta Globulin, U: 1.3
C-ANCA: <1:20 {titer}
Complement C3, Body Fluid: 146
Complement C4, Body Fluid: 42
Globulin, Total: 3.7
Total Protein: 6.8 g/dL

## 2013-01-15 ENCOUNTER — Ambulatory Visit
Admission: RE | Admit: 2013-01-15 | Discharge: 2013-01-15 | Disposition: A | Discharge status: Home / Self Care | Payer: Managed Care, Other (non HMO) | Source: Ambulatory Visit | Attending: Nephrology | Admitting: Nephrology

## 2013-01-16 ENCOUNTER — Other Ambulatory Visit: Payer: Managed Care, Other (non HMO)

## 2013-02-13 LAB — LIPID PANEL W/DIRECT LDL/HDL RATIO
ALPHA-2-GLOBULIN, U: 4.8
Albumin: 63
Alpha-1-Globulin, U: 5.5
Cholesterol, Total: 277
HDL: 42 mg/dL (ref 35–70)
Iron: 268
LDL (calc): 195
LDL/HDL Ratio: 4.6
Protein Creatinine Ratio: 474.7
Protein, Total: 535
Protein, Ur: 112.7
UIBC: 249
VLDL Cholesterol Cal: 40

## 2013-02-22 ENCOUNTER — Other Ambulatory Visit (HOSPITAL_COMMUNITY): Payer: Self-pay | Admitting: *Deleted

## 2013-02-22 ENCOUNTER — Emergency Department (HOSPITAL_COMMUNITY): Payer: Managed Care, Other (non HMO)

## 2013-02-22 ENCOUNTER — Emergency Department (HOSPITAL_COMMUNITY)
Admission: EM | Admit: 2013-02-22 | Discharge: 2013-02-22 | Disposition: A | Discharge status: Home / Self Care | Payer: Managed Care, Other (non HMO) | Attending: Emergency Medicine | Admitting: Emergency Medicine

## 2013-02-22 ENCOUNTER — Encounter (HOSPITAL_COMMUNITY): Payer: Self-pay | Admitting: *Deleted

## 2013-02-22 DIAGNOSIS — Z8719 Personal history of other diseases of the digestive system: Secondary | ICD-10-CM | POA: Insufficient documentation

## 2013-02-22 DIAGNOSIS — E1149 Type 2 diabetes mellitus with other diabetic neurological complication: Secondary | ICD-10-CM | POA: Insufficient documentation

## 2013-02-22 DIAGNOSIS — Z79899 Other long term (current) drug therapy: Secondary | ICD-10-CM | POA: Insufficient documentation

## 2013-02-22 DIAGNOSIS — Z794 Long term (current) use of insulin: Secondary | ICD-10-CM | POA: Insufficient documentation

## 2013-02-22 DIAGNOSIS — Z8679 Personal history of other diseases of the circulatory system: Secondary | ICD-10-CM | POA: Insufficient documentation

## 2013-02-22 DIAGNOSIS — Z8742 Personal history of other diseases of the female genital tract: Secondary | ICD-10-CM | POA: Insufficient documentation

## 2013-02-22 DIAGNOSIS — E1140 Type 2 diabetes mellitus with diabetic neuropathy, unspecified: Secondary | ICD-10-CM

## 2013-02-22 DIAGNOSIS — N189 Chronic kidney disease, unspecified: Secondary | ICD-10-CM | POA: Insufficient documentation

## 2013-02-22 DIAGNOSIS — M62838 Other muscle spasm: Secondary | ICD-10-CM | POA: Insufficient documentation

## 2013-02-22 DIAGNOSIS — I129 Hypertensive chronic kidney disease with stage 1 through stage 4 chronic kidney disease, or unspecified chronic kidney disease: Secondary | ICD-10-CM | POA: Insufficient documentation

## 2013-02-22 DIAGNOSIS — Z87440 Personal history of urinary (tract) infections: Secondary | ICD-10-CM | POA: Insufficient documentation

## 2013-02-22 DIAGNOSIS — E1142 Type 2 diabetes mellitus with diabetic polyneuropathy: Secondary | ICD-10-CM | POA: Insufficient documentation

## 2013-02-22 DIAGNOSIS — Z862 Personal history of diseases of the blood and blood-forming organs and certain disorders involving the immune mechanism: Secondary | ICD-10-CM | POA: Insufficient documentation

## 2013-02-22 DIAGNOSIS — IMO0001 Reserved for inherently not codable concepts without codable children: Secondary | ICD-10-CM | POA: Insufficient documentation

## 2013-02-22 MED ORDER — KETOROLAC TROMETHAMINE 60 MG/2ML IM SOLN
60.0000 mg | Freq: Once | INTRAMUSCULAR | Status: AC
  Administered 2013-02-22: 60 mg via INTRAMUSCULAR
  Filled 2013-02-22: qty 2

## 2013-02-22 MED ORDER — TRAMADOL HCL 50 MG PO TABS: 50.0000 mg | ORAL_TABLET | Freq: Four times a day (QID) | ORAL | Status: DC | PRN

## 2013-02-22 NOTE — ED Notes (Signed)
Pt ambulatory to exam room with steady gait. Pt states she has tried Motrin 800mg  with no relief. Pt states pain is intermittent and shoots through her toes. Pt arrives with companion.

## 2013-02-22 NOTE — ED Provider Notes (Signed)
History  This chart was scribed for non-physician practitioner working with Heather Norrie, MD, by Truddie Coco, ED Scribe. This patient was seen in room WTR7/WTR7 and the patient's care was started at 10:10 PM   CSN: [id number]  Arrival date & time 02/22/13  2039   First MD Initiated Contact with Patient 02/22/13 2146      Chief Complaint  Patient presents with  . Foot Pain     The history is provided by the patient. No language interpreter was used.   Heather [name] is a 42 y.o. female who presents to the Emergency Department complaining of intermittent spasms and pain to the fourth digit of the right foot that started yesterday.  Pt reports the episodes come about every minute.  Pt denies recent trauma or injury.  She has taken ibuprofen with no relief.  Pt has h/o fracture to the right toes about two years ago and surgical repair.  Pt has h/o diabetes, HTN, neuropathy, high cholesterol, and fibromyalgia.    Past Medical History  Diagnosis Date  . Gastroparesis   . Anemia   . IBS (irritable bowel syndrome)   . Nausea   . Hx: UTI (urinary tract infection)   . Fibroids   . Fibromyalgia   . Diabetes mellitus   . Hypertension   . Chronic kidney disease     Past Surgical History  Procedure Laterality Date  . Eye surgery      bilateral  . Fibroid tumors removed    . Foot surgery      bilateral    Family History  Problem Relation Age of Onset  . Colon polyps Mother   . Anesthesia problems Neg Hx   . Hypotension Neg Hx   . Malignant hyperthermia Neg Hx   . Pseudochol deficiency Neg Hx     History  Substance Use Topics  . Smoking status: Never Smoker   . Smokeless tobacco: Not on file  . Alcohol Use: No    OB History   Grav Para Term Preterm Abortions TAB SAB Ect Mult Living   2 1   1  1   1       Review of Systems  Musculoskeletal: Positive for arthralgias (pain to the fourth digit of the right foot).  All other systems reviewed and are  negative.    Allergies  Dilaudid  Home Medications   Current Outpatient Rx  Name  Route  Sig  Dispense  Refill  . insulin NPH (HUMULIN N,NOVOLIN N) 100 UNIT/ML injection   Subcutaneous   Inject 35 Units into the skin 2 (two) times daily before a meal.          . pregabalin (LYRICA) 150 MG capsule   Oral   Take 150 mg by mouth 2 (two) times daily.             BP 165/84  Pulse 103  Temp(Src) 98.7 F (37.1 C)  Resp 20  SpO2 98%  LMP 02/10/2013  Physical Exam  Nursing note and vitals reviewed. Constitutional: [name] oriented to person, place, and time. She appears well-developed and well-nourished. No distress.  HENT:  Head: Normocephalic and atraumatic.  Eyes: Conjunctivae and EOM are normal.  Neck: Normal range of motion. Neck supple. No tracheal deviation present.  Cardiovascular: Normal rate, regular rhythm and normal heart sounds.   Pulmonary/Chest: Effort normal and breath sounds normal. No respiratory distress.  Musculoskeletal: Normal range of motion.  5/5 strength of toe flexion and extension.  Mild tenderness noted at head of fourth metatarsal at MTP joint.  No pain with ROM, percussion, compression.     Neurological: [name] alert and oriented to person, place, and time.  Skin: Skin is warm and dry.  Psychiatric: She has a normal mood and affect. Her behavior is normal.    ED Course  Procedures  DIAGNOSTIC STUDIES: Oxygen Saturation is 98% on room air, normal by my interpretation.    COORDINATION OF CARE:  10:13 PMDiscussed course of care with pt which includes.  Pt understands and agrees.    Labs Reviewed - No data to display Dg Foot Complete Right  02/22/2013  *RADIOLOGY REPORT*  Clinical Data: Right foot pain, swelling, and soreness across the dorsal surface of the metatarsals.  No injury is indicated.  RIGHT FOOT COMPLETE - 3+ VIEW  Comparison: 09/19/2011  Findings: Mild degenerative narrowing of the first metatarsophalangeal joint.  Cortical  irregularity and apparent bone resorption versus postoperative changes involving the proximal interphalangeal joints of the third and fourth toes.  This is unchanged.  No evidence of acute fracture or subluxation.  No focal bone lesion.  No radiopaque soft tissue foreign bodies.  Stable appearance since previous study.  IMPRESSION: No acute bony abnormalities demonstrated.  Mild degenerative changes in the first metatarsophalangeal joint.  Chronic bone resorption versus postoperative change at the proximal interphalangeal joints of the third and fourth toes.   Original Report Authenticated By: Lucienne Capers, M.D.      1. Diabetic neuropathy       MDM  42 y/o female with diabetic neuropathy. No acute findings seen on exam or xray. She was given toradol in ED and tramadol at discharge. She will f/u with PCP to discuss changes in neuropathy management.   I personally performed the services described in this documentation, which was scribed in my presence. The recorded information has been reviewed and is accurate.        Illene Labrador, PA-C 02/23/13 [place], PA-C 02/26/13 2212

## 2013-02-22 NOTE — ED Notes (Signed)
Pt c/o spasms on right foot; swelling; no obvious injury; symptoms started yesterday

## 2013-02-23 ENCOUNTER — Encounter (HOSPITAL_COMMUNITY)
Admission: RE | Admit: 2013-02-23 | Discharge: 2013-02-23 | Disposition: A | Discharge status: Home / Self Care | Payer: Managed Care, Other (non HMO) | Source: Ambulatory Visit | Attending: Nephrology | Admitting: Nephrology

## 2013-02-23 VITALS — BP 188/100 | HR 84 | Temp 98.2°F | Resp 18 | Ht 69.0 in | Wt 242.0 lb

## 2013-02-23 DIAGNOSIS — N182 Chronic kidney disease, stage 2 (mild): Secondary | ICD-10-CM | POA: Insufficient documentation

## 2013-02-23 DIAGNOSIS — D638 Anemia in other chronic diseases classified elsewhere: Secondary | ICD-10-CM | POA: Insufficient documentation

## 2013-02-23 MED ORDER — SODIUM CHLORIDE 0.9 % IV SOLN
1020.0000 mg | Freq: Once | INTRAVENOUS | Status: AC
  Administered 2013-02-23: 1020 mg via INTRAVENOUS
  Filled 2013-02-23: qty 34

## 2013-02-23 MED ORDER — EPOETIN ALFA 20000 UNIT/ML IJ SOLN
INTRAMUSCULAR | Status: AC
  Administered 2013-02-23: 20000 [IU] via SUBCUTANEOUS
  Filled 2013-02-23: qty 1

## 2013-02-23 MED ORDER — EPOETIN ALFA 20000 UNIT/ML IJ SOLN
20000.0000 [IU] | INTRAMUSCULAR | Status: DC
  Administered 2013-02-23: 20000 [IU] via SUBCUTANEOUS

## 2013-02-27 LAB — PROTEIN ELECTRO, RANDOM URINE
Creat: 237.4
Protein Creatinine Ratio: 340.4
Protein, Total: 808

## 2013-02-28 NOTE — ED Provider Notes (Signed)
Medical screening examination/treatment/procedure(s) were performed by non-physician practitioner and as supervising physician I was immediately available for consultation/collaboration. Rolland Porter, MD, Abram Sander   [name], MD 02/28/13 [phone]

## 2013-03-16 ENCOUNTER — Encounter (HOSPITAL_COMMUNITY)
Admission: RE | Admit: 2013-03-16 | Discharge: 2013-03-16 | Disposition: A | Discharge status: Home / Self Care | Payer: Managed Care, Other (non HMO) | Source: Ambulatory Visit | Attending: Nephrology | Admitting: Nephrology

## 2013-03-16 VITALS — BP 147/95 | HR 96 | Temp 97.8°F | Resp 18

## 2013-03-16 DIAGNOSIS — N182 Chronic kidney disease, stage 2 (mild): Secondary | ICD-10-CM

## 2013-03-16 MED ORDER — EPOETIN ALFA 20000 UNIT/ML IJ SOLN: 20000.0000 [IU] | INTRAMUSCULAR | Status: DC

## 2013-03-16 MED ORDER — EPOETIN ALFA 20000 UNIT/ML IJ SOLN
INTRAMUSCULAR | Status: AC
  Administered 2013-03-16: 20000 [IU] via SUBCUTANEOUS
  Filled 2013-03-16: qty 1

## 2013-03-19 ENCOUNTER — Encounter (HOSPITAL_COMMUNITY): Payer: Self-pay | Admitting: *Deleted

## 2013-03-19 ENCOUNTER — Emergency Department (HOSPITAL_COMMUNITY)
Admission: EM | Admit: 2013-03-19 | Discharge: 2013-03-20 | Disposition: A | Discharge status: Home / Self Care | Payer: Managed Care, Other (non HMO) | Attending: Emergency Medicine | Admitting: Emergency Medicine

## 2013-03-19 DIAGNOSIS — R350 Frequency of micturition: Secondary | ICD-10-CM | POA: Insufficient documentation

## 2013-03-19 DIAGNOSIS — R739 Hyperglycemia, unspecified: Secondary | ICD-10-CM

## 2013-03-19 DIAGNOSIS — Z862 Personal history of diseases of the blood and blood-forming organs and certain disorders involving the immune mechanism: Secondary | ICD-10-CM | POA: Insufficient documentation

## 2013-03-19 DIAGNOSIS — N189 Chronic kidney disease, unspecified: Secondary | ICD-10-CM | POA: Insufficient documentation

## 2013-03-19 DIAGNOSIS — Z79899 Other long term (current) drug therapy: Secondary | ICD-10-CM | POA: Insufficient documentation

## 2013-03-19 DIAGNOSIS — Z8742 Personal history of other diseases of the female genital tract: Secondary | ICD-10-CM | POA: Insufficient documentation

## 2013-03-19 DIAGNOSIS — Z3202 Encounter for pregnancy test, result negative: Secondary | ICD-10-CM | POA: Insufficient documentation

## 2013-03-19 DIAGNOSIS — Z794 Long term (current) use of insulin: Secondary | ICD-10-CM | POA: Insufficient documentation

## 2013-03-19 DIAGNOSIS — E669 Obesity, unspecified: Secondary | ICD-10-CM | POA: Insufficient documentation

## 2013-03-19 DIAGNOSIS — I129 Hypertensive chronic kidney disease with stage 1 through stage 4 chronic kidney disease, or unspecified chronic kidney disease: Secondary | ICD-10-CM | POA: Insufficient documentation

## 2013-03-19 DIAGNOSIS — Z8719 Personal history of other diseases of the digestive system: Secondary | ICD-10-CM | POA: Insufficient documentation

## 2013-03-19 DIAGNOSIS — Z87440 Personal history of urinary (tract) infections: Secondary | ICD-10-CM | POA: Insufficient documentation

## 2013-03-19 DIAGNOSIS — E7800 Pure hypercholesterolemia, unspecified: Secondary | ICD-10-CM | POA: Insufficient documentation

## 2013-03-19 DIAGNOSIS — E1169 Type 2 diabetes mellitus with other specified complication: Secondary | ICD-10-CM | POA: Insufficient documentation

## 2013-03-19 LAB — URINALYSIS, COMPLETE
Bilirubin (Urine): NEGATIVE
Nitrite: NEGATIVE
Specific Gravity: 1.015
Urobilinogen, Ur: 0.2
WBC: NONE SEEN
pH: 6

## 2013-03-19 LAB — GENERAL HEALTH PANEL
Albumin/Globulin Ratio: 1.2
Albumin: 3.5
BUN/Creatinine Ratio: 16
Calcium: 9.4 mg/dL
Carbon Disulfide: 25
GFR, Est African American: 32
GFR, Est Non African American: 28
Globulin, Total: 3
Protein: 6.5
Sodium: 133 mmol/L — AB (ref 137–147)

## 2013-03-19 LAB — PTH, INTACT
Creat: 57.6
Protein/Creat Ratio: 2300
Total Protein: 132.5 g/dL

## 2013-03-19 NOTE — ED Notes (Signed)
Pt states sugar has been high all day; headache; legs and feet are numb

## 2013-03-20 LAB — URINALYSIS, ROUTINE W REFLEX MICROSCOPIC
Bilirubin Urine: NEGATIVE
Ketones, ur: NEGATIVE mg/dL
Leukocytes, UA: NEGATIVE
Nitrite: NEGATIVE
Protein, ur: 100 mg/dL — AB
Urobilinogen, UA: 0.2 mg/dL (ref 0.0–1.0)
pH: 5.5 (ref 5.0–8.0)

## 2013-03-20 LAB — COMPREHENSIVE METABOLIC PANEL
Alkaline Phosphatase: 78 U/L (ref 39–117)
BUN: 32 mg/dL — ABNORMAL HIGH (ref 6–23)
CO2: 22 mEq/L (ref 19–32)
GFR calc Af Amer: 40 mL/min — ABNORMAL LOW (ref >90)
GFR calc non Af Amer: 34 mL/min — ABNORMAL LOW (ref >90)
Glucose, Bld: 491 mg/dL — ABNORMAL HIGH (ref 70–99)
Potassium: 6.4 mEq/L (ref 3.5–5.1)
Total Protein: 7.1 g/dL (ref 6.0–8.3)

## 2013-03-20 LAB — CBC WITH DIFFERENTIAL/PLATELET
Eosinophils Absolute: 0 10*3/uL (ref 0.0–0.7)
Eosinophils Relative: 0 % (ref 0–5)
Hemoglobin: 11.3 g/dL — ABNORMAL LOW (ref 12.0–15.0)
Lymphocytes Relative: 13 % (ref 12–46)
Lymphs Abs: 1.5 10*3/uL (ref 0.7–4.0)
MCH: 27.6 pg (ref 26.0–34.0)
MCV: 84.1 fL (ref 78.0–100.0)
Monocytes Relative: 6 % (ref 3–12)
Neutrophils Relative %: 81 % — ABNORMAL HIGH (ref 43–77)
RBC: 4.1 MIL/uL (ref 3.87–5.11)

## 2013-03-20 LAB — GLUCOSE, CAPILLARY
Glucose-Capillary: 507 mg/dL — ABNORMAL HIGH (ref 70–99)
Glucose-Capillary: 454 mg/dL — ABNORMAL HIGH (ref 70–99)
Glucose-Capillary: 392 mg/dL — ABNORMAL HIGH (ref 70–99)

## 2013-03-20 LAB — URINE MICROSCOPIC-ADD ON

## 2013-03-20 MED ORDER — KETOROLAC TROMETHAMINE 30 MG/ML IJ SOLN
30.0000 mg | Freq: Once | INTRAMUSCULAR | Status: AC
  Administered 2013-03-20: 30 mg via INTRAVENOUS
  Filled 2013-03-20: qty 1

## 2013-03-20 MED ORDER — SODIUM CHLORIDE 0.9 % IV BOLUS (SEPSIS)
1000.0000 mL | Freq: Once | INTRAVENOUS | Status: AC
Start: 2013-03-20 — End: 2013-03-20
  Administered 2013-03-20: 1000 mL via INTRAVENOUS

## 2013-03-20 MED ORDER — SODIUM CHLORIDE 0.9 % IV BOLUS (SEPSIS)
1000.0000 mL | Freq: Once | INTRAVENOUS | Status: AC
  Administered 2013-03-20: 1000 mL via INTRAVENOUS

## 2013-03-20 MED ORDER — INSULIN ASPART 100 UNIT/ML ~~LOC~~ SOLN
15.0000 [IU] | Freq: Once | SUBCUTANEOUS | Status: AC
  Administered 2013-03-20: 15 [IU] via INTRAVENOUS
  Filled 2013-03-20: qty 15

## 2013-03-20 MED ORDER — INSULIN ASPART 100 UNIT/ML ~~LOC~~ SOLN
12.0000 [IU] | Freq: Once | SUBCUTANEOUS | Status: AC
  Administered 2013-03-20: 12 [IU] via INTRAVENOUS
  Filled 2013-03-20: qty 1

## 2013-03-20 MED ORDER — INSULIN REGULAR HUMAN 100 UNIT/ML IJ SOLN: 15.0000 [IU] | Freq: Once | INTRAMUSCULAR | Status: DC

## 2013-03-20 NOTE — ED Notes (Signed)
Pt. Made aware for the need of urine. 

## 2013-03-20 NOTE — ED Notes (Signed)
Pt. CBG 395, RN, [employer].

## 2013-03-20 NOTE — ED Notes (Signed)
Pt. CBG 507, PA Dammen made aware.RN,Abigail notified.

## 2013-03-20 NOTE — ED Notes (Signed)
Pt. CBG 454, Notified RN, Abigail.

## 2013-03-20 NOTE — ED Notes (Signed)
Potassium of 6.4 reported to Dr Marnette Burgess  Orders received

## 2013-03-20 NOTE — ED Notes (Signed)
EKG old and new given to EDP, Montez Morita, MD.

## 2013-03-20 NOTE — ED Notes (Signed)
Pt. CBG 248, notified RN, [place].

## 2013-03-20 NOTE — ED Provider Notes (Signed)
History     CSN: [id number]  Arrival date & time 03/19/13  2344   First MD Initiated Contact with Patient 03/20/13 0131      Chief Complaint  Patient presents with  . Hyperglycemia   HPI  History provided by the patient. Patient is a 42 year old African American female with history of hypertension, hypercholesterolemia, diabetes who presents with concerns for elevated blood sugars at home. Patient states her sugar is persistently been a higher than her meter at home would read.  Patient reports using her normal insulin NPH 70 units but has not had any significant changes. She reports having some recent congestion symptoms but these have been waxing and waning. Denies any recent fever, chills or sweats. No significant coughing. For some increased urine frequency but denies any other urinary complaints. No dysuria or hematuria. Denies any chest pain or abdominal pains. No nausea, vomiting, diarrhea or constipation. No other aggravating or alleviating factors. No other associated symptoms.    Past Medical History  Diagnosis Date  . Gastroparesis   . Anemia   . IBS (irritable bowel syndrome)   . Nausea   . Hx: UTI (urinary tract infection)   . Fibroids   . Fibromyalgia   . Diabetes mellitus   . Hypertension   . Chronic kidney disease     Past Surgical History  Procedure Laterality Date  . Eye surgery      bilateral  . Fibroid tumors removed    . Foot surgery      bilateral    Family History  Problem Relation Age of Onset  . Colon polyps Mother   . Anesthesia problems Neg Hx   . Hypotension Neg Hx   . Malignant hyperthermia Neg Hx   . Pseudochol deficiency Neg Hx     History  Substance Use Topics  . Smoking status: Never Smoker   . Smokeless tobacco: Not on file  . Alcohol Use: No    OB History   Grav Para Term Preterm Abortions TAB SAB Ect Mult Living   2 1   1  1   1       Review of Systems  Constitutional: Negative for fever and chills.  HENT: Positive  for congestion.   Respiratory: Negative for cough.   Cardiovascular: Negative for chest pain.  Gastrointestinal: Negative for nausea, vomiting, abdominal pain, diarrhea and constipation.  Genitourinary: Positive for frequency. Negative for dysuria, hematuria and flank pain.  All other systems reviewed and are negative.    Allergies  Dilaudid  Home Medications   Current Outpatient Rx  Name  Route  Sig  Dispense  Refill  . calcitRIOL (ROCALTROL) 0.25 MCG capsule   Oral   Take 0.25 mcg by mouth daily.         . insulin NPH (HUMULIN N,NOVOLIN N) 100 UNIT/ML injection   Subcutaneous   Inject 35 Units into the skin 2 (two) times daily before a meal.          . lisinopril (PRINIVIL,ZESTRIL) 10 MG tablet   Oral   Take 10 mg by mouth daily.         . pregabalin (LYRICA) 150 MG capsule   Oral   Take 150 mg by mouth 2 (two) times daily.           . rosuvastatin (CRESTOR) 10 MG tablet   Oral   Take 10 mg by mouth at bedtime.           BP 160/100  Pulse  95  Temp(Src) 97.8 F (36.6 C) (Oral)  Resp 20  SpO2 100%  LMP 03/01/2013  Physical Exam  Nursing note and vitals reviewed. Constitutional: [name] oriented to person, place, and time. She appears well-developed and well-nourished. No distress.  HENT:  Head: Normocephalic.  Mouth/Throat: Oropharynx is clear and moist.  Cardiovascular: Normal rate and regular rhythm.   Pulmonary/Chest: Effort normal and breath sounds normal. No respiratory distress.  Abdominal: Soft. There is no tenderness. There is no rebound.  Obese  Neurological: [name] alert and oriented to person, place, and time.  Skin: Skin is warm and dry. No rash noted.  Psychiatric: She has a normal mood and affect. Her behavior is normal.    ED Course  Procedures   Results for orders placed during the hospital encounter of 03/19/13  GLUCOSE, CAPILLARY      Result Value Range   Glucose-Capillary 526 (*) 70 - 99 mg/dL   Comment 1 Documented in  Chart     Comment 2 Notify RN    CBC WITH DIFFERENTIAL      Result Value Range   WBC 12.0 (*) 4.0 - 10.5 K/uL   RBC 4.10  3.87 - 5.11 MIL/uL   Hemoglobin 11.3 (*) 12.0 - 15.0 g/dL   HCT 34.5 (*) 36.0 - 46.0 %   MCV 84.1  78.0 - 100.0 fL   MCH 27.6  26.0 - 34.0 pg   MCHC 32.8  30.0 - 36.0 g/dL   RDW 14.8  11.5 - 15.5 %   Platelets 322  150 - 400 K/uL   Neutrophils Relative 81 (*) 43 - 77 %   Neutro Abs 9.8 (*) 1.7 - 7.7 K/uL   Lymphocytes Relative 13  12 - 46 %   Lymphs Abs 1.5  0.7 - 4.0 K/uL   Monocytes Relative 6  3 - 12 %   Monocytes Absolute 0.7  0.1 - 1.0 K/uL   Eosinophils Relative 0  0 - 5 %   Eosinophils Absolute 0.0  0.0 - 0.7 K/uL   Basophils Relative 0  0 - 1 %   Basophils Absolute 0.0  0.0 - 0.1 K/uL  COMPREHENSIVE METABOLIC PANEL      Result Value Range   Sodium 127 (*) 135 - 145 mEq/L   Potassium 6.4 (*) 3.5 - 5.1 mEq/L   Chloride 94 (*) 96 - 112 mEq/L   CO2 22  19 - 32 mEq/L   Glucose, Bld 491 (*) 70 - 99 mg/dL   BUN 32 (*) 6 - 23 mg/dL   Creatinine, Ser 1.77 (*) 0.50 - 1.10 mg/dL   Calcium 8.7  8.4 - 10.5 mg/dL   Total Protein 7.1  6.0 - 8.3 g/dL   Albumin 2.6 (*) 3.5 - 5.2 g/dL   AST 26  0 - 37 U/L   ALT 18  0 - 35 U/L   Alkaline Phosphatase 78  39 - 117 U/L   Total Bilirubin 0.1 (*) 0.3 - 1.2 mg/dL   GFR calc non Af Amer 34 (*) >90 mL/min   GFR calc Af Amer 40 (*) >90 mL/min  URINALYSIS, ROUTINE W REFLEX MICROSCOPIC      Result Value Range   Color, Urine YELLOW  YELLOW   APPearance CLEAR  CLEAR   Specific Gravity, Urine 1.031 (*) 1.005 - 1.030   pH 5.5  5.0 - 8.0   Glucose, UA >1000 (*) NEGATIVE mg/dL   Hgb urine dipstick TRACE (*) NEGATIVE   Bilirubin Urine  NEGATIVE  NEGATIVE   Ketones, ur NEGATIVE  NEGATIVE mg/dL   Protein, ur 100 (*) NEGATIVE mg/dL   Urobilinogen, UA 0.2  0.0 - 1.0 mg/dL   Nitrite NEGATIVE  NEGATIVE   Leukocytes, UA NEGATIVE  NEGATIVE  PREGNANCY, URINE      Result Value Range   Preg Test, Ur NEGATIVE  NEGATIVE  URINE  MICROSCOPIC-ADD ON      Result Value Range   Squamous Epithelial / LPF FEW (*) RARE   RBC / HPF 0-2  <3 RBC/hpf  POTASSIUM      Result Value Range   Potassium 5.2 (*) 3.5 - 5.1 mEq/L  GLUCOSE, CAPILLARY      Result Value Range   Glucose-Capillary 507 (*) 70 - 99 mg/dL   Comment 1 Notify RN    GLUCOSE, CAPILLARY      Result Value Range   Glucose-Capillary 454 (*) 70 - 99 mg/dL   Comment 1 Notify RN    GLUCOSE, CAPILLARY      Result Value Range   Glucose-Capillary 392 (*) 70 - 99 mg/dL   Comment 1 Notify RN    GLUCOSE, CAPILLARY      Result Value Range   Glucose-Capillary 395 (*) 70 - 99 mg/dL   Comment 1 Notify RN    GLUCOSE, CAPILLARY      Result Value Range   Glucose-Capillary 376 (*) 70 - 99 mg/dL   Comment 1 Notify RN    GLUCOSE, CAPILLARY      Result Value Range   Glucose-Capillary 248 (*) 70 - 99 mg/dL   Comment 1 Notify RN          1. Hyperglycemia       MDM  Patient seen and evaluated. Patient resting comfortable he appears in no acute distress.  Patient is hyperglycemic. Anion gap is 11 and is not indicate DKA at this time. Potassium elevated but reports small cysts of sample. Will repeat potassium  IV fluids and insulin ordered. We'll continue to monitor her blood sugar.  Repeat potassium improved. Still slightly elevated. There are no EKG changes. Patient receiving IV fluids and insulin.  Left ear continues to improve. Will continue fluids and monitor.    Date: 03/20/2013  Rate: 79  Rhythm: normal sinus rhythm  QRS Axis: normal  Intervals: normal  ST/T Wave abnormalities: normal  Conduction Disutrbances:none  Narrative Interpretation:   Old EKG Reviewed: No significant changes        Martie Lee, PA-C 03/20/13 2231

## 2013-03-20 NOTE — ED Notes (Addendum)
Pt. CBG 376, Notified RN, [employer].

## 2013-03-21 NOTE — ED Provider Notes (Signed)
Medical screening examination/treatment/procedure(s) were performed by non-physician practitioner and as supervising physician I was immediately available for consultation/collaboration.  Teressa Lower, MD 03/21/13 [phone]

## 2013-04-03 ENCOUNTER — Encounter (HOSPITAL_COMMUNITY): Payer: Self-pay | Admitting: Emergency Medicine

## 2013-04-03 ENCOUNTER — Emergency Department (HOSPITAL_COMMUNITY): Payer: Managed Care, Other (non HMO)

## 2013-04-03 ENCOUNTER — Emergency Department (HOSPITAL_COMMUNITY)
Admission: EM | Admit: 2013-04-03 | Discharge: 2013-04-03 | Disposition: A | Discharge status: Home / Self Care | Payer: Managed Care, Other (non HMO) | Attending: Emergency Medicine | Admitting: Emergency Medicine

## 2013-04-03 DIAGNOSIS — Z8719 Personal history of other diseases of the digestive system: Secondary | ICD-10-CM | POA: Insufficient documentation

## 2013-04-03 DIAGNOSIS — J029 Acute pharyngitis, unspecified: Secondary | ICD-10-CM | POA: Insufficient documentation

## 2013-04-03 DIAGNOSIS — R350 Frequency of micturition: Secondary | ICD-10-CM | POA: Insufficient documentation

## 2013-04-03 DIAGNOSIS — R110 Nausea: Secondary | ICD-10-CM | POA: Insufficient documentation

## 2013-04-03 DIAGNOSIS — N189 Chronic kidney disease, unspecified: Secondary | ICD-10-CM | POA: Insufficient documentation

## 2013-04-03 DIAGNOSIS — R1084 Generalized abdominal pain: Secondary | ICD-10-CM | POA: Insufficient documentation

## 2013-04-03 DIAGNOSIS — I129 Hypertensive chronic kidney disease with stage 1 through stage 4 chronic kidney disease, or unspecified chronic kidney disease: Secondary | ICD-10-CM | POA: Insufficient documentation

## 2013-04-03 DIAGNOSIS — R509 Fever, unspecified: Secondary | ICD-10-CM | POA: Insufficient documentation

## 2013-04-03 DIAGNOSIS — E1169 Type 2 diabetes mellitus with other specified complication: Secondary | ICD-10-CM | POA: Insufficient documentation

## 2013-04-03 DIAGNOSIS — R51 Headache: Secondary | ICD-10-CM | POA: Insufficient documentation

## 2013-04-03 DIAGNOSIS — Z87440 Personal history of urinary (tract) infections: Secondary | ICD-10-CM | POA: Insufficient documentation

## 2013-04-03 DIAGNOSIS — Z8742 Personal history of other diseases of the female genital tract: Secondary | ICD-10-CM | POA: Insufficient documentation

## 2013-04-03 DIAGNOSIS — R000 Tachycardia, unspecified: Secondary | ICD-10-CM | POA: Insufficient documentation

## 2013-04-03 DIAGNOSIS — J020 Streptococcal pharyngitis: Secondary | ICD-10-CM

## 2013-04-03 DIAGNOSIS — Z3202 Encounter for pregnancy test, result negative: Secondary | ICD-10-CM | POA: Insufficient documentation

## 2013-04-03 DIAGNOSIS — Z862 Personal history of diseases of the blood and blood-forming organs and certain disorders involving the immune mechanism: Secondary | ICD-10-CM | POA: Insufficient documentation

## 2013-04-03 DIAGNOSIS — Z8739 Personal history of other diseases of the musculoskeletal system and connective tissue: Secondary | ICD-10-CM | POA: Insufficient documentation

## 2013-04-03 DIAGNOSIS — Z79899 Other long term (current) drug therapy: Secondary | ICD-10-CM | POA: Insufficient documentation

## 2013-04-03 DIAGNOSIS — R739 Hyperglycemia, unspecified: Secondary | ICD-10-CM

## 2013-04-03 DIAGNOSIS — Z794 Long term (current) use of insulin: Secondary | ICD-10-CM | POA: Insufficient documentation

## 2013-04-03 LAB — COMPREHENSIVE METABOLIC PANEL
ALT: 23 U/L (ref 0–35)
AST: 10 U/L (ref 0–37)
Alkaline Phosphatase: 91 U/L (ref 39–117)
CO2: 22 mEq/L (ref 19–32)
Calcium: 8.7 mg/dL (ref 8.4–10.5)
Chloride: 101 mEq/L (ref 96–112)
GFR calc Af Amer: 36 mL/min — ABNORMAL LOW (ref >90)
GFR calc non Af Amer: 31 mL/min — ABNORMAL LOW (ref >90)
Glucose, Bld: 366 mg/dL — ABNORMAL HIGH (ref 70–99)
Potassium: 5 mEq/L (ref 3.5–5.1)
Sodium: 132 mEq/L — ABNORMAL LOW (ref 135–145)
Total Bilirubin: 0.3 mg/dL (ref 0.3–1.2)

## 2013-04-03 LAB — CBC WITH DIFFERENTIAL/PLATELET
Basophils Absolute: 0 10*3/uL (ref 0.0–0.1)
Eosinophils Relative: 0 % (ref 0–5)
Hemoglobin: 11.7 g/dL — ABNORMAL LOW (ref 12.0–15.0)
Lymphocytes Relative: 8 % — ABNORMAL LOW (ref 12–46)
Lymphs Abs: 1 10*3/uL (ref 0.7–4.0)
MCV: 85.8 fL (ref 78.0–100.0)
Neutro Abs: 10.7 10*3/uL — ABNORMAL HIGH (ref 1.7–7.7)
Platelets: 190 10*3/uL (ref 150–400)
RBC: 4.15 MIL/uL (ref 3.87–5.11)
RDW: 14.8 % (ref 11.5–15.5)
WBC: 12.5 10*3/uL — ABNORMAL HIGH (ref 4.0–10.5)

## 2013-04-03 LAB — URINALYSIS, MICROSCOPIC ONLY
Bilirubin Urine: NEGATIVE
Glucose, UA: >1000 mg/dL — AB
Ketones, ur: NEGATIVE mg/dL
Protein, ur: >300 mg/dL — AB
Specific Gravity, Urine: 1.028 (ref 1.005–1.030)
Urobilinogen, UA: 0.2 mg/dL (ref 0.0–1.0)

## 2013-04-03 LAB — RAPID STREP SCREEN (MED CTR MEBANE ONLY): Streptococcus, Group A Screen (Direct): POSITIVE — AB

## 2013-04-03 MED ORDER — PENICILLIN G BENZATHINE 1200000 UNIT/2ML IM SUSP
1.2000 10*6.[IU] | Freq: Once | INTRAMUSCULAR | Status: AC
  Administered 2013-04-03: 1.2 10*6.[IU] via INTRAMUSCULAR
  Filled 2013-04-03: qty 2

## 2013-04-03 MED ORDER — DIPHENHYDRAMINE HCL 50 MG/ML IJ SOLN
25.0000 mg | Freq: Once | INTRAMUSCULAR | Status: AC
  Administered 2013-04-03: 25 mg via INTRAVENOUS
  Filled 2013-04-03: qty 1

## 2013-04-03 MED ORDER — HYDROCODONE-ACETAMINOPHEN 7.5-500 MG/15ML PO SOLN: 15.0000 mL | ORAL | Status: DC | PRN

## 2013-04-03 MED ORDER — SODIUM CHLORIDE 0.9 % IV BOLUS (SEPSIS)
1000.0000 mL | Freq: Once | INTRAVENOUS | Status: AC
  Administered 2013-04-03: 1000 mL via INTRAVENOUS

## 2013-04-03 MED ORDER — METOCLOPRAMIDE HCL 5 MG/ML IJ SOLN
10.0000 mg | Freq: Once | INTRAMUSCULAR | Status: AC
  Administered 2013-04-03: 10 mg via INTRAVENOUS
  Filled 2013-04-03: qty 2

## 2013-04-03 MED ORDER — SODIUM CHLORIDE 0.9 % IV SOLN
INTRAVENOUS | Status: DC
  Administered 2013-04-03: 14:00:00 via INTRAVENOUS

## 2013-04-03 MED ORDER — INSULIN ASPART 100 UNIT/ML ~~LOC~~ SOLN
8.0000 [IU] | Freq: Once | SUBCUTANEOUS | Status: AC
  Administered 2013-04-03: 8 [IU] via SUBCUTANEOUS
  Filled 2013-04-03: qty 1

## 2013-04-03 MED ORDER — MORPHINE SULFATE 4 MG/ML IJ SOLN
4.0000 mg | Freq: Once | INTRAMUSCULAR | Status: AC
  Administered 2013-04-03: 4 mg via INTRAVENOUS
  Filled 2013-04-03: qty 1

## 2013-04-03 NOTE — ED Notes (Signed)
Pt states she has been feeling nauseous, having abdominal pain, sore throat and headache since yesterday.

## 2013-04-03 NOTE — ED Notes (Signed)
Pt states she has been having headache, sore throat, abd pain.  C/o nausea but no vomiting, diarrhea.  Pain 10/10.

## 2013-04-03 NOTE — ED Notes (Signed)
[id number] Expected date:<BR> Expected time:<BR> Means of arrival:<BR> Comments:<BR>

## 2013-04-03 NOTE — ED Provider Notes (Signed)
History     CSN: [id number]  Arrival date & time 04/03/13  1029   First MD Initiated Contact with Patient 04/03/13 1118      Chief Complaint  Patient presents with  . Headache  . Sore Throat  . Abdominal Pain    (Consider location/radiation/quality/duration/timing/severity/associated sxs/prior treatment) Patient is a 42 y.o. female presenting with headaches, pharyngitis, and abdominal pain. The history is provided by the patient and the spouse.  Headache Associated symptoms: abdominal pain   Sore Throat Associated symptoms include abdominal pain and headaches.  Abdominal Pain  Patient here complaining of bitemporal headache x2 days with some nausea without vomiting. Some subjective fever without neck pain or photophobia. Denies any rashes. Some sore throat. Denies any vomiting or diarrhea. Has had increased cough and some urinary frequency but without dysuria or hematuria. Symptoms have been persistent and nothing makes them better worse. No treatment used prior to arrival  Past Medical History  Diagnosis Date  . Gastroparesis   . Anemia   . IBS (irritable bowel syndrome)   . Nausea   . Hx: UTI (urinary tract infection)   . Fibroids   . Fibromyalgia   . Diabetes mellitus   . Hypertension   . Chronic kidney disease     Past Surgical History  Procedure Laterality Date  . Eye surgery      bilateral  . Fibroid tumors removed    . Foot surgery      bilateral    Family History  Problem Relation Age of Onset  . Colon polyps Mother   . Anesthesia problems Neg Hx   . Hypotension Neg Hx   . Malignant hyperthermia Neg Hx   . Pseudochol deficiency Neg Hx     History  Substance Use Topics  . Smoking status: Never Smoker   . Smokeless tobacco: Not on file  . Alcohol Use: No    OB History   Grav Para Term Preterm Abortions TAB SAB Ect Mult Living   2 1   1  1   1       Review of Systems  Gastrointestinal: Positive for abdominal pain.  Neurological: Positive for  headaches.  All other systems reviewed and are negative.    Allergies  Dilaudid  Home Medications   Current Outpatient Rx  Name  Route  Sig  Dispense  Refill  . calcitRIOL (ROCALTROL) 0.25 MCG capsule   Oral   Take 0.25 mcg by mouth every evening.          . insulin NPH (HUMULIN N,NOVOLIN N) 100 UNIT/ML injection   Subcutaneous   Inject 45 Units into the skin 2 (two) times daily before a meal.          . lisinopril (PRINIVIL,ZESTRIL) 10 MG tablet   Oral   Take 10 mg by mouth every evening.          . pregabalin (LYRICA) 150 MG capsule   Oral   Take 150 mg by mouth every evening.          . rosuvastatin (CRESTOR) 10 MG tablet   Oral   Take 10 mg by mouth at bedtime.           BP 105/67  Pulse 110  Temp(Src) 99.3 F (37.4 C) (Oral)  Resp 18  SpO2 100%  LMP 03/01/2013  Physical Exam  Nursing note and vitals reviewed. Constitutional: [name] oriented to person, place, and time. She appears well-developed and well-nourished.  Non-toxic appearance. No distress.  HENT:  Head: Normocephalic and atraumatic.  Eyes: Conjunctivae, EOM and lids are normal. Pupils are equal, round, and reactive to light.  Neck: Normal range of motion. Neck supple. No tracheal deviation present. No mass present.  Cardiovascular: Regular rhythm and normal heart sounds.  Tachycardia present.  Exam reveals no gallop.   No murmur heard. Pulmonary/Chest: Effort normal and breath sounds normal. No stridor. No respiratory distress. She has no decreased breath sounds. She has no wheezes. She has no rhonchi. She has no rales.  Abdominal: Soft. Normal appearance and bowel sounds are normal. She exhibits no distension. There is generalized tenderness. There is no rigidity, no rebound, no guarding and no CVA tenderness.  Musculoskeletal: Normal range of motion. She exhibits no edema and no tenderness.  Neurological: [name] alert and oriented to person, place, and time. She has normal strength. No  cranial nerve deficit or sensory deficit. GCS eye subscore is 4. GCS verbal subscore is 5. GCS motor subscore is 6.  Skin: Skin is warm and dry. No abrasion and no rash noted.  Psychiatric: She has a normal mood and affect. Her speech is normal and behavior is normal.    ED Course  Procedures (including critical care time)  Labs Reviewed  CBC WITH DIFFERENTIAL - Abnormal; Notable for the following:    WBC 12.5 (*)    Hemoglobin 11.7 (*)    HCT 35.6 (*)    Neutrophils Relative 86 (*)    Neutro Abs 10.7 (*)    Lymphocytes Relative 8 (*)    All other components within normal limits  RAPID STREP SCREEN  COMPREHENSIVE METABOLIC PANEL  LIPASE, BLOOD  URINALYSIS, MICROSCOPIC ONLY   No results found.   No diagnosis found.    MDM  Pt given pcn for her strep throat--also given iv fluids for her hyperglycemia along with a dose of insulin. Headache treated with Reglan and morphine. No concern for meningitis at this time. Suspect that the strep throat as was the cause of her symptoms. She has no nuchal rigidity. Her mental status exam is normal. Will be discharged home. Patient's creatinine noted and she does have a history of renal insufficiency. Suspect that the mild elevation today is from her dehydration.        Leota Jacobsen, MD 04/03/13 1331

## 2013-04-06 ENCOUNTER — Encounter (HOSPITAL_COMMUNITY): Payer: Managed Care, Other (non HMO)

## 2013-04-20 ENCOUNTER — Encounter (HOSPITAL_COMMUNITY)
Admission: RE | Admit: 2013-04-20 | Discharge: 2013-04-20 | Disposition: A | Discharge status: Home / Self Care | Payer: Managed Care, Other (non HMO) | Source: Ambulatory Visit | Attending: Nephrology | Admitting: Nephrology

## 2013-04-20 VITALS — BP 146/102 | HR 98 | Temp 98.2°F | Resp 18

## 2013-04-20 DIAGNOSIS — N182 Chronic kidney disease, stage 2 (mild): Secondary | ICD-10-CM | POA: Insufficient documentation

## 2013-04-20 LAB — IRON AND TIBC
Iron: 59 ug/dL (ref 42–135)
TIBC: 271 ug/dL (ref 250–470)
UIBC: 212 ug/dL (ref 125–400)

## 2013-04-20 LAB — FERRITIN: Ferritin: 220 ng/mL (ref 10–291)

## 2013-04-20 MED ORDER — EPOETIN ALFA 20000 UNIT/ML IJ SOLN
INTRAMUSCULAR | Status: AC
  Filled 2013-04-20: qty 1

## 2013-04-20 MED ORDER — EPOETIN ALFA 20000 UNIT/ML IJ SOLN
20000.0000 [IU] | INTRAMUSCULAR | Status: DC
  Administered 2013-04-20: 20000 [IU] via SUBCUTANEOUS

## 2013-05-11 ENCOUNTER — Encounter (HOSPITAL_COMMUNITY)
Admission: RE | Admit: 2013-05-11 | Discharge: 2013-05-11 | Disposition: A | Discharge status: Home / Self Care | Payer: Managed Care, Other (non HMO) | Source: Ambulatory Visit | Attending: Nephrology | Admitting: Nephrology

## 2013-05-11 VITALS — BP 152/98 | HR 80 | Temp 98.6°F | Resp 18

## 2013-05-11 DIAGNOSIS — N182 Chronic kidney disease, stage 2 (mild): Secondary | ICD-10-CM

## 2013-05-11 MED ORDER — EPOETIN ALFA 20000 UNIT/ML IJ SOLN
20000.0000 [IU] | INTRAMUSCULAR | Status: DC
  Administered 2013-05-11: 20000 [IU] via SUBCUTANEOUS

## 2013-05-11 MED ORDER — EPOETIN ALFA 20000 UNIT/ML IJ SOLN
INTRAMUSCULAR | Status: AC
  Filled 2013-05-11: qty 1

## 2013-05-30 ENCOUNTER — Ambulatory Visit: Payer: Self-pay | Admitting: Nurse Practitioner

## 2013-06-01 ENCOUNTER — Encounter (HOSPITAL_COMMUNITY)
Admission: RE | Admit: 2013-06-01 | Discharge: 2013-06-01 | Disposition: A | Discharge status: Home / Self Care | Payer: Managed Care, Other (non HMO) | Source: Ambulatory Visit | Attending: Nephrology | Admitting: Nephrology

## 2013-06-01 VITALS — BP 151/104 | HR 96 | Temp 98.4°F | Resp 18

## 2013-06-01 DIAGNOSIS — N182 Chronic kidney disease, stage 2 (mild): Secondary | ICD-10-CM

## 2013-06-01 LAB — FERRITIN: Ferritin: 61 ng/mL (ref 10–291)

## 2013-06-01 LAB — IRON AND TIBC: Iron: 72 ug/dL (ref 42–135)

## 2013-06-01 MED ORDER — EPOETIN ALFA 20000 UNIT/ML IJ SOLN: 20000.0000 [IU] | INTRAMUSCULAR | Status: DC

## 2013-06-14 ENCOUNTER — Other Ambulatory Visit (HOSPITAL_COMMUNITY): Payer: Self-pay

## 2013-06-15 ENCOUNTER — Encounter (HOSPITAL_COMMUNITY): Payer: Managed Care, Other (non HMO)

## 2013-06-22 ENCOUNTER — Encounter (HOSPITAL_COMMUNITY)
Admission: RE | Admit: 2013-06-22 | Discharge: 2013-06-22 | Disposition: A | Discharge status: Home / Self Care | Payer: Managed Care, Other (non HMO) | Source: Ambulatory Visit | Attending: Nephrology | Admitting: Nephrology

## 2013-06-22 VITALS — BP 174/94 | HR 80 | Temp 98.4°F | Resp 20

## 2013-06-22 DIAGNOSIS — N182 Chronic kidney disease, stage 2 (mild): Secondary | ICD-10-CM

## 2013-06-22 LAB — POCT HEMOGLOBIN-HEMACUE: Hemoglobin: 11 g/dL — ABNORMAL LOW (ref 12.0–15.0)

## 2013-06-22 MED ORDER — EPOETIN ALFA 20000 UNIT/ML IJ SOLN
INTRAMUSCULAR | Status: AC
  Filled 2013-06-22: qty 1

## 2013-06-22 MED ORDER — EPOETIN ALFA 20000 UNIT/ML IJ SOLN
20000.0000 [IU] | INTRAMUSCULAR | Status: DC
  Administered 2013-06-22: 20000 [IU] via SUBCUTANEOUS

## 2013-07-13 ENCOUNTER — Ambulatory Visit (INDEPENDENT_AMBULATORY_CARE_PROVIDER_SITE_OTHER): Payer: Managed Care, Other (non HMO) | Admitting: Nurse Practitioner

## 2013-07-13 ENCOUNTER — Encounter (HOSPITAL_COMMUNITY): Payer: Managed Care, Other (non HMO)

## 2013-07-13 ENCOUNTER — Encounter: Payer: Self-pay | Admitting: Nurse Practitioner

## 2013-07-13 VITALS — BP 192/108 | HR 88 | Temp 97.6°F | Ht 69.0 in | Wt 239.0 lb

## 2013-07-13 DIAGNOSIS — E1140 Type 2 diabetes mellitus with diabetic neuropathy, unspecified: Secondary | ICD-10-CM

## 2013-07-13 DIAGNOSIS — E1149 Type 2 diabetes mellitus with other diabetic neurological complication: Secondary | ICD-10-CM

## 2013-07-13 DIAGNOSIS — E1142 Type 2 diabetes mellitus with diabetic polyneuropathy: Secondary | ICD-10-CM

## 2013-07-13 HISTORY — DX: Type 2 diabetes mellitus with diabetic neuropathy, unspecified: E11.40

## 2013-07-13 MED ORDER — DULOXETINE HCL 30 MG PO CPEP: 30.0000 mg | ORAL_CAPSULE | Freq: Every day | ORAL | Status: DC

## 2013-07-13 MED ORDER — DULOXETINE HCL 60 MG PO CPEP: 60.0000 mg | ORAL_CAPSULE | Freq: Every day | ORAL | Status: DC

## 2013-07-13 NOTE — Patient Instructions (Addendum)
Start Cymbalta 30mg , take 1 casule daily for 1 week. After 1st week, increase to 60mg  dose, 1 capsule daily.  I am ordering PT/OT for hand weakness.  Try Metanx Supplement Capsules after you have been on Cymbalta for 1 month.  Take 1 capsule twice a day. Samples given.  If it helps, call our office to order.  It comes from a Armed forces operational officer.  Follow up in 6 months.

## 2013-07-13 NOTE — Progress Notes (Signed)
[facility]  PATIENT: Heather [name] DOB: 06-Mar-1971   HISTORY FROM: Patient, chart REASON FOR VISIT: 6 month follow up   HISTORICAL  CHIEF COMPLAINT:  Chief Complaint  Patient presents with  . Follow-up    HISTORY OF PRESENT ILLNESS: Heather [name] is a 42 year old AA female with past history of Diabetes, uncontrolled, obesity, fibromyalgia, hypertension, CKD, frequent UTI's and Gastroparesis here for evaluation of numbness and weakness in her hands.  Patient reports six-year history of numbness, burning and tingling in her feet. This is gradually progressive now involving bilateral lower extremities almost up to her knees. Several months ago she began to develop tingling and numbness in her fingertips, this is a good to involve her bilateral hands. She also feels weakness in her hand grip. Last hemoglobin A1c was 12 according to the patient. She's been on Lyrica 150 mg twice a day for neuropathy control. She has not tried Cymbalta.  UPDATE 07/13/13:  Patient returns to office for six-month followup of neuropathy.  She states that she thinks her neuropathy is progressively getting worse, it is increasingly difficult for her to grip something in her hand and difficult for her to write.  She states she is working to get her diabetes under better control but her A1c is still 10.  She did not get the prescription for Cymbalta filled from the last visit due to money concerns. She states that she is now met her deductible and would like to try it.  He continues to take Lyrica 150 mg twice a day.  REVIEW OF SYSTEMS: Full 14 system review of systems performed and notable only for:  Constitutional: Fatigue  Cardiovascular: Swelling in legs  Ear/Nose/Throat: N/A  Skin: N/A  Eyes: N/A  Respiratory: N/A  Gastroitestinal: N/A  Hematology/Lymphatic: N/A  Endocrine: N/A Musculoskeletal:N/A  Allergy/Immunology: N/A  Neurological: Numbness, weakness Sleep: Insomnia,  restless legs Psychiatric: N/A   ALLERGIES: Allergies  Allergen Reactions  . Dilaudid (Hydromorphone Hcl) Itching    HOME MEDICATIONS: Outpatient Prescriptions Prior to Visit  Medication Sig Dispense Refill  . calcitRIOL (ROCALTROL) 0.25 MCG capsule Take 0.25 mcg by mouth every evening.       . insulin NPH (HUMULIN N,NOVOLIN N) 100 UNIT/ML injection Inject 45 Units into the skin 2 (two) times daily before a meal.       . lisinopril (PRINIVIL,ZESTRIL) 10 MG tablet Take 10 mg by mouth every evening.       . pregabalin (LYRICA) 150 MG capsule Take 150 mg by mouth every evening.       . rosuvastatin (CRESTOR) 10 MG tablet Take 10 mg by mouth at bedtime.      [name] HYDROcodone-acetaminophen (LORTAB) 7.5-500 MG/15ML solution Take 15 mLs by mouth every 4 (four) hours as needed for pain.  120 mL  0   No facility-administered medications prior to visit.    PAST MEDICAL HISTORY: Past Medical History  Diagnosis Date  . Gastroparesis   . Anemia   . IBS (irritable bowel syndrome)   . Nausea   . Hx: UTI (urinary tract infection)   . Fibroids   . Fibromyalgia   . Diabetes mellitus   . Hypertension   . Chronic kidney disease     PAST SURGICAL HISTORY: Past Surgical History  Procedure Laterality Date  . Eye surgery      bilateral  . Fibroid tumors removed    . Foot surgery      bilateral    FAMILY HISTORY:  Family History  Problem Relation Age of Onset  . Colon polyps Mother   . Diabetes Mother   . Anesthesia problems Neg Hx   . Hypotension Neg Hx   . Malignant hyperthermia Neg Hx   . Pseudochol deficiency Neg Hx   . Heart murmur Father     SOCIAL HISTORY: History   Social History  . Marital Status: Divorced    Spouse Name: N/A    Number of Children: 1  . Years of Education: college   Occupational History  .  [employer]   Social History Main Topics  . Smoking status: Never Smoker   . Smokeless tobacco: Never Used  . Alcohol Use: No  . Drug Use: No  .  Sexually Active: Yes    Birth Control/ Protection: None   Other Topics Concern  . Not on file   Social History Narrative   Patient lives at home with family.   Caffeine Use: 2 cups daily; sodas occasionally     PHYSICAL EXAM  Filed Vitals:   07/13/13 1447  BP: 192/108  Pulse: 88  Temp: 97.6 F (36.4 C)  TempSrc: Oral  Height: 5\' 9"  (1.753 m)  Weight: 239 lb (108.41 kg)   Body mass index is 35.28 kg/(m^2).  Generalized: In no acute distress, pleasant AA female  Neck: Supple, no carotid bruits   Cardiac: Regular rate rhythm, no murmur   Pulmonary: Clear to auscultation bilaterally   Musculoskeletal: No deformity   Neurological examination   Mentation: Alert oriented to time, place, history taking, language fluent, and causual conversation  Cranial nerve II-XII: Pupils were equal round reactive to light extraocular movements were full, visual field were full on confrontational test. facial sensation and strength were normal. hearing was intact to finger rubbing bilaterally. Uvula tongue midline. head turning and shoulder shrug and were normal and symmetric.Tongue protrusion into cheek strength was normal. MOTOR: normal bulk and tone, full strength in the BUE,  fine finger movements normal, no pronator drift. MILD BL GRIP WEAKNESS. SENSORY: DECREASED PP AND VIB IN BLE AND BL FINGERTIPS. COORDINATION: finger-nose-finger, heel-to-shin bilaterally, there was no truncal ataxia REFLEXES: Brachioradialis 2/2, biceps 2/2, triceps 2/2, patellar 2/2, Achilles 2/2, plantar responses were flexor bilaterally. GAIT/STATION: Rising up from seated position without assistance, SLOW ANTALGIC GAIT. UNABLE TO WALK TIPTOE, DIFFICULTY WITH HEEL WALKING, TANDEM UNSTEADY.  ROMBERG POSITIVE.  DIAGNOSTIC DATA (LABS, IMAGING, TESTING) - I reviewed patient records, labs, notes, testing and imaging myself where available.  Lab Results  Component Value Date   WBC 12.5* 04/03/2013   HGB 11.0*  06/22/2013   HCT 35.6* 04/03/2013   MCV 85.8 04/03/2013   PLT 190 04/03/2013      Component Value Date/Time   NA 132* 04/03/2013 1103   K 5.0 04/03/2013 1103   CL 101 04/03/2013 1103   CO2 22 04/03/2013 1103   GLUCOSE 366* 04/03/2013 1103   BUN 20 04/03/2013 1103   CREATININE 1.90* 04/03/2013 1103   CALCIUM 8.7 04/03/2013 1103   PROT 6.6 04/03/2013 1103   ALBUMIN 2.5* 04/03/2013 1103   AST 10 04/03/2013 1103   ALT 23 04/03/2013 1103   ALKPHOS 91 04/03/2013 1103   BILITOT 0.3 04/03/2013 1103   GFRNONAA 31* 04/03/2013 1103   GFRAA 36* 04/03/2013 1103   No results found for this basename: CHOL,  HDL,  LDLCALC,  LDLDIRECT,  TRIG,  CHOLHDL   Lab Results  Component Value Date   HGBA1C 11.4* 09/20/2011   Lab Results  Component Value Date   [id number] 09/15/2008   Lab Results  Component Value Date   TSH 1.886 01/21/2011    ASSESSMENT AND PLAN 42 year old right-handed female with hypertension, diabetes, no numbness and tingling in her hands and feet. Suspect this is a progression of her diabetic neuropathy.  1. Start Cymbalta 30mg , take 1 casule daily for 1 week.  After 1st week, increase to 60mg  dose, 1 capsule daily. 2. OT for hand weakness, eval and treat. 3. Try Metanx Supplement Capsules after you have been on Cymbalta for 1 month.  Take 1 capsule twice a day. Samples, 4 boxes, 6 caps each, given.  If it helps, call our office to order.  It comes from a Armed forces operational officer. 4. Continue diabetes management. 4. Follow up in 6 months.  Orders Placed This Encounter  Procedures  . Ambulatory referral to Occupational Therapy    Meds ordered this encounter  Medications  . DULoxetine (CYMBALTA) 30 MG capsule    Sig: Take 1 capsule (30 mg total) by mouth daily.    Dispense:  7 capsule    Refill:  0    Order Specific Question:  Supervising Provider    Answer:  Andrey Spearman R [[id number]]  . DULoxetine (CYMBALTA) 60 MG capsule    Sig: Take 1 capsule (60 mg total) by mouth daily.    Dispense:  30 capsule     Refill:  11    Order Specific Question:  Supervising Provider    Answer:  Penni Bombard [[id number]]   Jyla Hopf NP-C 07/13/2013, 3:26 PM  [facility] [address] [place], [place] [address] [phone]

## 2013-07-13 NOTE — Progress Notes (Signed)
I reviewed note and agree with plan.   Penni Bombard, MD [id number], [id number] PM Certified in Neurology, Neurophysiology and Neuroimaging  [facility] [address], [place], [place] [address] [phone]

## 2013-07-24 ENCOUNTER — Encounter (HOSPITAL_COMMUNITY)
Admission: RE | Admit: 2013-07-24 | Discharge: 2013-07-24 | Disposition: A | Discharge status: Home / Self Care | Payer: Managed Care, Other (non HMO) | Source: Ambulatory Visit | Attending: Nephrology | Admitting: Nephrology

## 2013-07-24 VITALS — BP 174/100 | HR 95 | Temp 98.5°F | Resp 20

## 2013-07-24 DIAGNOSIS — N182 Chronic kidney disease, stage 2 (mild): Secondary | ICD-10-CM | POA: Insufficient documentation

## 2013-07-24 LAB — IRON AND TIBC
Saturation Ratios: 46 % (ref 20–55)
UIBC: 141 ug/dL (ref 125–400)

## 2013-07-24 MED ORDER — EPOETIN ALFA 20000 UNIT/ML IJ SOLN: 20000.0000 [IU] | INTRAMUSCULAR | Status: DC

## 2013-07-24 MED ORDER — CLONIDINE HCL 0.1 MG PO TABS
0.1000 mg | ORAL_TABLET | Freq: Once | ORAL | Status: AC | PRN
  Administered 2013-07-24: 0.1 mg via ORAL

## 2013-07-24 MED ORDER — EPOETIN ALFA 20000 UNIT/ML IJ SOLN
INTRAMUSCULAR | Status: AC
  Administered 2013-07-24: 20000 [IU] via SUBCUTANEOUS
  Filled 2013-07-24: qty 1

## 2013-07-24 MED ORDER — CLONIDINE HCL 0.1 MG PO TABS
ORAL_TABLET | ORAL | Status: AC
  Filled 2013-07-24: qty 1

## 2013-07-25 LAB — FERRITIN: Ferritin: 110 ng/mL (ref 10–291)

## 2013-08-01 LAB — GENERAL HEALTH PANEL
Albumin: 3.1
BUN/Creatinine Ratio: 11
BUN: 19 mg/dL (ref 4–21)
Calcium: 8.5 mg/dL
Carbon Disulfide: 26
Chloride: 99 mmol/L
GFR calc non Af Amer: 36
Glucose: 377
Potassium: 4.2 mmol/L

## 2013-08-01 LAB — PTH, INTACT: PTH Interp: 103

## 2013-08-08 LAB — URINALYSIS, COMPLETE
Bilirubin (Urine): NEGATIVE
Nitrite: NEGATIVE
Specific Gravity: 1.02
Urobilinogen, Ur: 0.2
pH: 6.5

## 2013-08-15 ENCOUNTER — Encounter (HOSPITAL_COMMUNITY)
Admission: RE | Admit: 2013-08-15 | Discharge: 2013-08-15 | Disposition: A | Discharge status: Home / Self Care | Payer: Managed Care, Other (non HMO) | Source: Ambulatory Visit | Attending: Nephrology | Admitting: Nephrology

## 2013-08-15 VITALS — BP 151/81 | HR 93 | Temp 98.2°F | Resp 18

## 2013-08-15 DIAGNOSIS — N182 Chronic kidney disease, stage 2 (mild): Secondary | ICD-10-CM

## 2013-08-15 MED ORDER — EPOETIN ALFA 20000 UNIT/ML IJ SOLN
20000.0000 [IU] | INTRAMUSCULAR | Status: DC
  Administered 2013-08-15: 20000 [IU] via SUBCUTANEOUS

## 2013-08-15 MED ORDER — EPOETIN ALFA 20000 UNIT/ML IJ SOLN
INTRAMUSCULAR | Status: AC
  Filled 2013-08-15: qty 1

## 2013-08-28 ENCOUNTER — Encounter: Payer: Self-pay | Admitting: Obstetrics

## 2013-08-28 ENCOUNTER — Ambulatory Visit (INDEPENDENT_AMBULATORY_CARE_PROVIDER_SITE_OTHER): Payer: Managed Care, Other (non HMO) | Admitting: Obstetrics

## 2013-08-28 VITALS — BP 115/82 | HR 99 | Temp 97.9°F | Ht 69.0 in | Wt 239.0 lb

## 2013-08-28 DIAGNOSIS — Z01419 Encounter for gynecological examination (general) (routine) without abnormal findings: Secondary | ICD-10-CM

## 2013-08-28 DIAGNOSIS — Z0000 Encounter for general adult medical examination without abnormal findings: Secondary | ICD-10-CM

## 2013-08-28 MED ORDER — METFORMIN HCL ER 500 MG PO TB24: ORAL_TABLET | ORAL | Status: DC

## 2013-08-28 MED ORDER — TRAMADOL HCL 50 MG PO TABS: ORAL_TABLET | ORAL | Status: DC

## 2013-08-28 MED ORDER — CITRANATAL HARMONY 27-1-260 MG PO CAPS: 1.0000 | ORAL_CAPSULE | Freq: Every day | ORAL | Status: DC

## 2013-08-28 NOTE — Progress Notes (Signed)
Subjective:     Heather [name] is a 42 y.o. female here for a routine exam.  Current complaints: Patient is in the office for annual exam. Patient is still trying for pregnancy- but has not happened..  Personal health questionnaire reviewed: no.   Gynecologic History Patient's last menstrual period was 08/10/2013. Contraception: none Last Pap: 1 year. Results were: normal Last mammogram: 1 year. Results were: normal  Obstetric History OB History  Gravida Para Term Preterm AB SAB TAB Ectopic Multiple Living  2 1   1 1    1     # Outcome Date GA Lbr Len/2nd Weight Sex Delivery Anes PTL Lv  2 SAB           1 PAR                The following portions of the patient's history were reviewed and updated as appropriate: allergies, current medications, past family history, past medical history, past social history, past surgical history and problem list.  Review of Systems Pertinent items are noted in HPI.    Objective:    General appearance: alert and no distress Breasts: normal appearance, no masses or tenderness Abdomen: normal findings: soft, non-tender Pelvic: cervix normal in appearance, external genitalia normal, no adnexal masses or tenderness, no cervical motion tenderness, uterus normal size, shape, and consistency and vagina normal without discharge Extremities: extremities normal, atraumatic, no cyanosis or edema    Assessment:    Healthy female exam.    Plan:    Follow up in: 1 year.

## 2013-08-29 LAB — GENERAL HEALTH PANEL
BUN/Creatinine Ratio: 8
Calcium: 8.8 mg/dL
Carbon Monoxide, Blood: 28
Chloride: 98 mmol/L
GFR, Est African American: 36
Glucose: 418
Phosphorus: 4.2 mg/dL (ref 2.5–4.9)
Potassium: 4.1 mmol/L
Sodium: 134 mmol/L — AB (ref 137–147)

## 2013-08-29 LAB — PAP IG W/ RFLX HPV ASCU

## 2013-08-29 LAB — WET PREP BY MOLECULAR PROBE: Trichomonas vaginosis: NEGATIVE

## 2013-08-30 ENCOUNTER — Encounter: Payer: Self-pay | Admitting: Obstetrics

## 2013-09-04 ENCOUNTER — Other Ambulatory Visit (HOSPITAL_COMMUNITY): Payer: Self-pay | Admitting: *Deleted

## 2013-09-04 ENCOUNTER — Ambulatory Visit: Payer: Managed Care, Other (non HMO) | Attending: Nurse Practitioner | Admitting: Occupational Therapy

## 2013-09-04 DIAGNOSIS — IMO0001 Reserved for inherently not codable concepts without codable children: Secondary | ICD-10-CM | POA: Insufficient documentation

## 2013-09-04 DIAGNOSIS — M6281 Muscle weakness (generalized): Secondary | ICD-10-CM | POA: Insufficient documentation

## 2013-09-04 DIAGNOSIS — M25549 Pain in joints of unspecified hand: Secondary | ICD-10-CM | POA: Insufficient documentation

## 2013-09-04 DIAGNOSIS — R279 Unspecified lack of coordination: Secondary | ICD-10-CM | POA: Insufficient documentation

## 2013-09-04 LAB — HEMOGLOBIN A1C: Hgb A1c MFr Bld: 12 % — AB (ref 4.0–6.0)

## 2013-09-05 ENCOUNTER — Encounter (HOSPITAL_COMMUNITY)
Admission: RE | Admit: 2013-09-05 | Discharge: 2013-09-05 | Disposition: A | Discharge status: Home / Self Care | Payer: Managed Care, Other (non HMO) | Source: Ambulatory Visit | Attending: Nephrology | Admitting: Nephrology

## 2013-09-05 DIAGNOSIS — N182 Chronic kidney disease, stage 2 (mild): Secondary | ICD-10-CM

## 2013-09-05 LAB — IRON AND TIBC
Iron: 84 ug/dL (ref 42–135)
UIBC: 178 ug/dL (ref 125–400)

## 2013-09-05 MED ORDER — EPOETIN ALFA 20000 UNIT/ML IJ SOLN
20000.0000 [IU] | INTRAMUSCULAR | Status: DC
  Administered 2013-09-05: 20000 [IU] via SUBCUTANEOUS

## 2013-09-06 LAB — FERRITIN: Ferritin: 48 ng/mL (ref 10–291)

## 2013-09-07 ENCOUNTER — Ambulatory Visit: Payer: Managed Care, Other (non HMO) | Admitting: Occupational Therapy

## 2013-09-11 ENCOUNTER — Ambulatory Visit: Payer: Managed Care, Other (non HMO) | Admitting: Occupational Therapy

## 2013-09-13 ENCOUNTER — Ambulatory Visit: Payer: Managed Care, Other (non HMO) | Admitting: Occupational Therapy

## 2013-09-14 DIAGNOSIS — R5381 Other malaise: Secondary | ICD-10-CM | POA: Insufficient documentation

## 2013-09-14 DIAGNOSIS — E1129 Type 2 diabetes mellitus with other diabetic kidney complication: Secondary | ICD-10-CM | POA: Insufficient documentation

## 2013-09-14 DIAGNOSIS — R079 Chest pain, unspecified: Secondary | ICD-10-CM | POA: Insufficient documentation

## 2013-09-14 DIAGNOSIS — R52 Pain, unspecified: Secondary | ICD-10-CM | POA: Insufficient documentation

## 2013-09-14 DIAGNOSIS — M7989 Other specified soft tissue disorders: Secondary | ICD-10-CM | POA: Insufficient documentation

## 2013-09-14 DIAGNOSIS — Z8742 Personal history of other diseases of the female genital tract: Secondary | ICD-10-CM | POA: Insufficient documentation

## 2013-09-14 DIAGNOSIS — Z79899 Other long term (current) drug therapy: Secondary | ICD-10-CM | POA: Insufficient documentation

## 2013-09-14 DIAGNOSIS — I129 Hypertensive chronic kidney disease with stage 1 through stage 4 chronic kidney disease, or unspecified chronic kidney disease: Secondary | ICD-10-CM | POA: Insufficient documentation

## 2013-09-14 DIAGNOSIS — Z8719 Personal history of other diseases of the digestive system: Secondary | ICD-10-CM | POA: Insufficient documentation

## 2013-09-14 DIAGNOSIS — IMO0001 Reserved for inherently not codable concepts without codable children: Secondary | ICD-10-CM | POA: Insufficient documentation

## 2013-09-14 DIAGNOSIS — Z862 Personal history of diseases of the blood and blood-forming organs and certain disorders involving the immune mechanism: Secondary | ICD-10-CM | POA: Insufficient documentation

## 2013-09-14 DIAGNOSIS — N189 Chronic kidney disease, unspecified: Secondary | ICD-10-CM | POA: Insufficient documentation

## 2013-09-14 DIAGNOSIS — R609 Edema, unspecified: Secondary | ICD-10-CM | POA: Insufficient documentation

## 2013-09-14 DIAGNOSIS — Z87440 Personal history of urinary (tract) infections: Secondary | ICD-10-CM | POA: Insufficient documentation

## 2013-09-14 DIAGNOSIS — R0602 Shortness of breath: Secondary | ICD-10-CM | POA: Insufficient documentation

## 2013-09-15 ENCOUNTER — Encounter (HOSPITAL_COMMUNITY): Payer: Self-pay | Admitting: Emergency Medicine

## 2013-09-15 ENCOUNTER — Emergency Department (HOSPITAL_COMMUNITY)
Admission: EM | Admit: 2013-09-15 | Discharge: 2013-09-15 | Disposition: A | Discharge status: Home / Self Care | Payer: Managed Care, Other (non HMO) | Attending: Emergency Medicine | Admitting: Emergency Medicine

## 2013-09-15 ENCOUNTER — Emergency Department (HOSPITAL_COMMUNITY): Payer: Managed Care, Other (non HMO)

## 2013-09-15 DIAGNOSIS — R52 Pain, unspecified: Secondary | ICD-10-CM

## 2013-09-15 DIAGNOSIS — R600 Localized edema: Secondary | ICD-10-CM

## 2013-09-15 DIAGNOSIS — M7989 Other specified soft tissue disorders: Secondary | ICD-10-CM

## 2013-09-15 DIAGNOSIS — R079 Chest pain, unspecified: Secondary | ICD-10-CM

## 2013-09-15 LAB — CBC WITH DIFFERENTIAL/PLATELET
Basophils Absolute: 0 K/uL (ref 0.0–0.1)
Basophils Relative: 0 % (ref 0–1)
Eosinophils Absolute: 0.2 K/uL (ref 0.0–0.7)
Eosinophils Relative: 3 % (ref 0–5)
HCT: 28.1 % — ABNORMAL LOW (ref 36.0–46.0)
Hemoglobin: 9.1 g/dL — ABNORMAL LOW (ref 12.0–15.0)
Lymphocytes Relative: 28 % (ref 12–46)
Lymphs Abs: 2.5 K/uL (ref 0.7–4.0)
MCH: 28.7 pg (ref 26.0–34.0)
MCHC: 32.4 g/dL (ref 30.0–36.0)
MCV: 88.6 fL (ref 78.0–100.0)
Monocytes Absolute: 0.7 K/uL (ref 0.1–1.0)
Monocytes Relative: 8 % (ref 3–12)
Neutro Abs: 5.5 K/uL (ref 1.7–7.7)
Neutrophils Relative %: 62 % (ref 43–77)
Platelets: 329 K/uL (ref 150–400)
RBC: 3.17 MIL/uL — ABNORMAL LOW (ref 3.87–5.11)
RDW: 14.3 % (ref 11.5–15.5)
WBC: 8.9 K/uL (ref 4.0–10.5)

## 2013-09-15 LAB — BASIC METABOLIC PANEL WITH GFR
BUN: 23 mg/dL (ref 6–23)
CO2: 24 meq/L (ref 19–32)
Calcium: 8.8 mg/dL (ref 8.4–10.5)
Chloride: 101 meq/L (ref 96–112)
Creatinine, Ser: 1.89 mg/dL — ABNORMAL HIGH (ref 0.50–1.10)
GFR calc Af Amer: 36 mL/min — ABNORMAL LOW
GFR calc non Af Amer: 31 mL/min — ABNORMAL LOW
Glucose, Bld: 304 mg/dL — ABNORMAL HIGH (ref 70–99)
Potassium: 3.9 meq/L (ref 3.5–5.1)
Sodium: 134 meq/L — ABNORMAL LOW (ref 135–145)

## 2013-09-15 LAB — URINALYSIS, ROUTINE W REFLEX MICROSCOPIC
Nitrite: NEGATIVE
Specific Gravity, Urine: 1.02 (ref 1.005–1.030)
Urobilinogen, UA: 0.2 mg/dL (ref 0.0–1.0)

## 2013-09-15 LAB — URINE MICROSCOPIC-ADD ON

## 2013-09-15 LAB — POCT I-STAT TROPONIN I: Troponin i, poc: 0 ng/mL (ref 0.00–0.08)

## 2013-09-15 LAB — PRO B NATRIURETIC PEPTIDE: Pro B Natriuretic peptide (BNP): 62.5 pg/mL (ref 0–125)

## 2013-09-15 MED ORDER — ONDANSETRON HCL 4 MG/2ML IJ SOLN
4.0000 mg | Freq: Once | INTRAMUSCULAR | Status: AC
  Administered 2013-09-15: 4 mg via INTRAVENOUS

## 2013-09-15 MED ORDER — HYDROMORPHONE HCL PF 1 MG/ML IJ SOLN
INTRAMUSCULAR | Status: AC
  Filled 2013-09-15: qty 1

## 2013-09-15 MED ORDER — DIPHENHYDRAMINE HCL 50 MG/ML IJ SOLN
INTRAMUSCULAR | Status: AC
  Filled 2013-09-15: qty 1

## 2013-09-15 MED ORDER — SODIUM CHLORIDE 0.9 % IV BOLUS (SEPSIS)
1000.0000 mL | Freq: Once | INTRAVENOUS | Status: AC
  Administered 2013-09-15: 1000 mL via INTRAVENOUS

## 2013-09-15 MED ORDER — DIPHENHYDRAMINE HCL 50 MG/ML IJ SOLN
25.0000 mg | Freq: Once | INTRAMUSCULAR | Status: AC
  Administered 2013-09-15: 25 mg via INTRAVENOUS

## 2013-09-15 MED ORDER — PREGABALIN 50 MG PO CAPS
150.0000 mg | ORAL_CAPSULE | Freq: Once | ORAL | Status: AC
  Administered 2013-09-15: 150 mg via ORAL
  Filled 2013-09-15: qty 3

## 2013-09-15 MED ORDER — PROMETHAZINE HCL 25 MG/ML IJ SOLN
25.0000 mg | Freq: Once | INTRAMUSCULAR | Status: AC
  Administered 2013-09-15: 25 mg via INTRAVENOUS
  Filled 2013-09-15: qty 1

## 2013-09-15 MED ORDER — TECHNETIUM TC 99M DIETHYLENETRIAME-PENTAACETIC ACID: 40.0000 | Freq: Once | INTRAVENOUS | Status: DC | PRN

## 2013-09-15 MED ORDER — PREGABALIN 50 MG PO CAPS
150.0000 mg | ORAL_CAPSULE | Freq: Once | ORAL | Status: DC
  Filled 2013-09-15: qty 3

## 2013-09-15 MED ORDER — HYDROMORPHONE HCL PF 1 MG/ML IJ SOLN
1.0000 mg | Freq: Once | INTRAMUSCULAR | Status: DC
  Filled 2013-09-15: qty 1

## 2013-09-15 MED ORDER — HYDROMORPHONE HCL PF 1 MG/ML IJ SOLN
1.0000 mg | Freq: Once | INTRAMUSCULAR | Status: AC
  Administered 2013-09-15: 1 mg via INTRAVENOUS

## 2013-09-15 MED ORDER — ONDANSETRON HCL 4 MG/2ML IJ SOLN
INTRAMUSCULAR | Status: AC
  Filled 2013-09-15: qty 2

## 2013-09-15 MED ORDER — POTASSIUM CHLORIDE CRYS ER 20 MEQ PO TBCR: 40.0000 meq | EXTENDED_RELEASE_TABLET | Freq: Once | ORAL | Status: DC

## 2013-09-15 MED ORDER — TECHNETIUM TO 99M ALBUMIN AGGREGATED: 6.0000 | Freq: Once | INTRAVENOUS | Status: DC | PRN

## 2013-09-15 NOTE — ED Provider Notes (Signed)
Pt signed out to me by [facility] PA-C at shift change. 3:56 PM Pt waiting on VQ scan and Venous duplex LE at 8am. If both negative for PE and DVT, will discharge home. 3:56 PM V/Q scan: no significant perfusion defect identified to suggest acute PE. Overall normal exam.  Venous duplex LE: negative for DVT.  Pt states she feels comfortable going home.  Pt appears drowsy, but denies pain at this time.  Pitting edema still present in both legs. Lungs: CTAB. No respiratory distress, able to speak in full sentences w/o difficulty.  Family members were present in the room who also had questions about pt care. All labs/imaging/findings discussed with patient.   All questions answered and concerns addressed. Will discharge pt home and have pt f/u with her specialists to discuss additional treatment options for continued leg edema. Return precautions given. Pt verbalized understanding and agreement with tx plan. Vitals: unremarkable. Discharged in stable condition.    Discussed pt with attending during ED encounter and agrees with plan.    Noland Fordyce, PA-C 09/15/13 1556

## 2013-09-15 NOTE — ED Provider Notes (Signed)
Medical screening examination/treatment/procedure(s) were performed by non-physician practitioner and as supervising physician I was immediately available for consultation/collaboration.  Kalman Drape, MD 09/15/13 2039

## 2013-09-15 NOTE — Progress Notes (Signed)
VASCULAR LAB PRELIMINARY  PRELIMINARY  PRELIMINARY  PRELIMINARY  Bilateral lower extremity venous Dopplers completed.    Preliminary report:  There is no DVT or SVT noted in the bilateral lower extremities.  Jeniece Hannis, RVT 09/15/2013, 10:56 AM

## 2013-09-15 NOTE — ED Provider Notes (Signed)
CSN: [id number]     Arrival date & time 09/14/13  2355 History   First MD Initiated Contact with Patient 09/15/13 0135     Chief Complaint  Patient presents with  . Generalized Body Aches   (Consider location/radiation/quality/duration/timing/severity/associated sxs/prior Treatment) HPI Comments: Patient is a 42 year old female with a past medical history of diabetes, CKD, and anemia who presents with a 1 day history generalized weakness. Symptoms started gradually and progressively worsened since the onset. Patient reports associated leg swelling, which is normal for her, but typically goes away after 1 day. Her leg swelling has been persistent for the past couple days. She also reports pleuritic left side chest pain that started suddenly today. The pain is aching and severe without radiation. Patient reports associated SOB. No aggravating/alleviating factors. No other associated symptoms. Patient reports she may have had a clot in her leg "years ago" but is unsure. She denies oral contraceptives, recent travel, surgery or immobilization. She has not tried anything for symptom relief.    Past Medical History  Diagnosis Date  . Gastroparesis   . Anemia   . IBS (irritable bowel syndrome)   . Nausea   . Hx: UTI (urinary tract infection)   . Fibroids   . Fibromyalgia   . Diabetes mellitus   . Hypertension   . Chronic kidney disease    Past Surgical History  Procedure Laterality Date  . Eye surgery      bilateral  . Fibroid tumors removed    . Foot surgery      bilateral   Family History  Problem Relation Age of Onset  . Colon polyps Mother   . Diabetes Mother   . Anesthesia problems Neg Hx   . Hypotension Neg Hx   . Malignant hyperthermia Neg Hx   . Pseudochol deficiency Neg Hx   . Heart murmur Father   . Hypertension Father    History  Substance Use Topics  . Smoking status: Never Smoker   . Smokeless tobacco: Never Used  . Alcohol Use: No   OB History   Grav Para Term  Preterm Abortions TAB SAB Ect Mult Living   2 1   1  1   1      Review of Systems  Respiratory: Positive for shortness of breath.   Cardiovascular: Positive for chest pain and leg swelling.  Musculoskeletal: Positive for myalgias.  Neurological: Positive for weakness.  All other systems reviewed and are negative.    Allergies  Dilaudid  Home Medications   Current Outpatient Rx  Name  Route  Sig  Dispense  Refill  . amLODipine (NORVASC) 10 MG tablet   Oral   Take 10 mg by mouth daily.          . calcitRIOL (ROCALTROL) 0.25 MCG capsule   Oral   Take 0.25 mcg by mouth every evening.          . furosemide (LASIX) 80 MG tablet   Oral   Take 80 mg by mouth 2 (two) times daily.          [name] lisinopril (PRINIVIL,ZESTRIL) 10 MG tablet   Oral   Take 10 mg by mouth every evening.          [name] NOVOLIN 70/30 RELION (70-30) 100 UNIT/ML injection   Subcutaneous   Inject 45 Units into the skin 2 (two) times daily with a meal.          . pregabalin (LYRICA) 150 MG capsule  Oral   Take 150 mg by mouth 2 (two) times daily.           BP 167/93  Pulse 98  Temp(Src) 98.4 F (36.9 C) (Oral)  Resp 18  SpO2 100%  LMP 08/10/2013 Physical Exam  Nursing note and vitals reviewed. Constitutional: [name] oriented to person, place, and time. She appears well-developed and well-nourished. No distress.  HENT:  Head: Normocephalic and atraumatic.  Eyes: EOM are normal. Pupils are equal, round, and reactive to light.  Pale conjunctiva.   Neck: Normal range of motion.  Cardiovascular: Normal rate and regular rhythm.  Exam reveals no gallop and no friction rub.   No murmur heard. Pulmonary/Chest: Effort normal and breath sounds normal. She has no wheezes. She has no rales. She exhibits no tenderness.  Abdominal: Soft. She exhibits no distension. There is no tenderness. There is no rebound and no guarding.  Musculoskeletal: Normal range of motion.  Bilateral lower leg pitting edema  with tenderness to palpation.   Neurological: [name] alert and oriented to person, place, and time. Coordination normal.  Speech is goal-oriented. Moves limbs without ataxia.   Skin: Skin is warm and dry.  Psychiatric: She has a normal mood and affect. Her behavior is normal.    ED Course  Procedures (including critical care time)   Date: 09/15/2013  Rate: 87  Rhythm: normal sinus rhythm  QRS Axis: normal  Intervals: normal  ST/T Wave abnormalities: normal  Conduction Disutrbances:none  Narrative Interpretation: NSR without acute changes  Old EKG Reviewed: none available    Labs Review Labs Reviewed  CBC WITH DIFFERENTIAL - Abnormal; Notable for the following:    RBC 3.17 (*)    Hemoglobin 9.1 (*)    HCT 28.1 (*)    All other components within normal limits  BASIC METABOLIC PANEL - Abnormal; Notable for the following:    Sodium 134 (*)    Glucose, Bld 304 (*)    Creatinine, Ser 1.89 (*)    GFR calc non Af Amer 31 (*)    GFR calc Af Amer 36 (*)    All other components within normal limits  URINALYSIS, ROUTINE W REFLEX MICROSCOPIC - Abnormal; Notable for the following:    Glucose, UA 500 (*)    Hgb urine dipstick TRACE (*)    Protein, ur 100 (*)    All other components within normal limits  URINE MICROSCOPIC-ADD ON - Abnormal; Notable for the following:    Squamous Epithelial / LPF FEW (*)    All other components within normal limits  D-DIMER, QUANTITATIVE - Abnormal; Notable for the following:    D-Dimer, Quant 0.66 (*)    All other components within normal limits  URINE CULTURE  PRO B NATRIURETIC PEPTIDE  POCT I-STAT TROPONIN I   Imaging Review Dg Chest 2 View  09/15/2013   *RADIOLOGY REPORT*  Clinical Data: Shortness of breath, chest pain  CHEST - 2 VIEW  Comparison: Prior radiograph from 04/03/2013  Findings: Cardiac and mediastinal silhouettes are stable in size and contour, and remain within normal limits.  Lungs are normally inflated.  No airspace  consolidation, pleural effusion, or pulmonary edema is identified.  There is no pneumothorax.  Bony thorax is intact.  IMPRESSION: No acute cardiopulmonary process.   Original Report Authenticated By: Jeannine Boga, M.D.   Nm Pulmonary Perf And Vent  09/15/2013   CLINICAL DATA:  Shortness of Breath. Cough. Elevated D-dimer.  EXAM: NUCLEAR MEDICINE VENTILATION - PERFUSION LUNG SCAN  TECHNIQUE: Ventilation images were obtained in multiple projections using inhaled aerosol technetium 99 M DTPA. Perfusion images were obtained in multiple projections after intravenous injection of Tc-30m MAA.  COMPARISON:  09/15/2013 radiographs of the chest.  RADIOPHARMACEUTICALS:  Forty mCi Tc-21m DTPA aerosol and 6.0 mCi Tc-69m MAA  FINDINGS: Ventilation: No focal ventilation defect.  Perfusion: No wedge shaped peripheral perfusion defects to suggest acute pulmonary embolism.  IMPRESSION: 1. No significant perfusion defect is identified to suggest acute pulmonary embolus. Overall normal exam taking into account the patient's body habitus.   Electronically Signed   By: Sherryl Barters   On: 09/15/2013 10:16    MDM   1. Body aches   2. Chest pain   3. Bilateral leg edema     2:05 AM Labs, urinalysis, EKG, troponin and chest xray pending. Vitals stable and patient afebrile.   4:15 AM Labs show slightly decreased hemoglobin as well as elevated d-dimer. Chest xray unremarkable. Patient informed she will need a VQ scan (no CT due to kidney disease). Patient informed there will be a wait until the technicians arrive around 8am. Patient does no necessarily qualify for admission at this time and should have complete work up before being discharged. Patient has agreed to wait to have the testing done. Patient requests pain and nausea medication. Vitals stable and patient afebrile.   5:57 AM  Patient vomited her lyrica so I will reorder the prescription with phenergan for nausea. Patient signed out to Noland Fordyce,  PA-C for disposition.   Alvina Chou, PA-C 09/15/13 2004

## 2013-09-15 NOTE — ED Notes (Signed)
EKG old and new given to EDP, Sharol Given, MD.

## 2013-09-15 NOTE — ED Notes (Signed)
Pt reports chest and limbs feeling "heavy" since last night. Pt noticed swelling to legs yesterday as well. Pt feeling chills and generalized body aches.

## 2013-09-16 ENCOUNTER — Telehealth: Payer: Self-pay | Admitting: *Deleted

## 2013-09-16 LAB — URINE CULTURE: Colony Count: 35000

## 2013-09-16 MED ORDER — METRONIDAZOLE 500 MG PO TABS: 500.0000 mg | ORAL_TABLET | Freq: Two times a day (BID) | ORAL | Status: DC

## 2013-09-16 NOTE — Telephone Encounter (Signed)
Message copied by Jiles Garter on Sun Sep 16, 2013 11:12 AM ------      Message from: Baltazar Najjar A      Created: Fri Aug 31, 2013  5:33 AM       Flagyl 500mg  po bid x 7 days. ------

## 2013-09-16 NOTE — Telephone Encounter (Signed)
Call placed to patient at [phone], patient advised of Rx to pharmacy, of note female individual stated patient was hospitalized overnight and was not feeling well. She was advised to call office in any questions.

## 2013-09-18 ENCOUNTER — Encounter: Payer: Managed Care, Other (non HMO) | Admitting: Occupational Therapy

## 2013-09-20 ENCOUNTER — Ambulatory Visit (INDEPENDENT_AMBULATORY_CARE_PROVIDER_SITE_OTHER): Payer: Managed Care, Other (non HMO) | Admitting: Nurse Practitioner

## 2013-09-20 ENCOUNTER — Encounter: Payer: Self-pay | Admitting: Nurse Practitioner

## 2013-09-20 VITALS — BP 140/88 | HR 95 | Temp 97.9°F | Ht 68.17 in | Wt 241.0 lb

## 2013-09-20 DIAGNOSIS — Z23 Encounter for immunization: Secondary | ICD-10-CM

## 2013-09-20 DIAGNOSIS — Z0000 Encounter for general adult medical examination without abnormal findings: Secondary | ICD-10-CM

## 2013-09-20 DIAGNOSIS — R234 Changes in skin texture: Secondary | ICD-10-CM

## 2013-09-20 DIAGNOSIS — Z1239 Encounter for other screening for malignant neoplasm of breast: Secondary | ICD-10-CM

## 2013-09-20 DIAGNOSIS — R609 Edema, unspecified: Secondary | ICD-10-CM

## 2013-09-20 DIAGNOSIS — K5900 Constipation, unspecified: Secondary | ICD-10-CM

## 2013-09-20 MED ORDER — POLYETHYLENE GLYCOL 3350 17 GM/SCOOP PO POWD: 17.0000 g | Freq: Every day | ORAL | Status: DC

## 2013-09-20 MED ORDER — TETANUS-DIPHTH-ACELL PERTUSSIS 5-2.5-18.5 LF-MCG/0.5 IM SUSP: 0.5000 mL | Freq: Once | INTRAMUSCULAR | Status: DC

## 2013-09-20 NOTE — Progress Notes (Signed)
Subjective:     Heather [name] is a 42 y.o. female and is here for a comprehensive physical exam. 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 Additionally, she is treated for chronic pain syndrome: FM & pain r/t neuropathy in hands & feet. The patient reports problems - sore on R toe, chronic LE edema, and severe constipation. Ms Heather [name] does not exercise.  History   Social History  . Marital Status: Divorced    Spouse Name: N/A    Number of Children: 1  . Years of Education: college   Occupational History  .  [employer]   Social History Main Topics  . Smoking status: Never Smoker   . Smokeless tobacco: Never Used  . Alcohol Use: No  . Drug Use: No  . Sexual Activity: Yes    Partners: Male    Birth Control/ Protection: None   Other Topics Concern  . Not on file   Social History Narrative   Patient lives with fiance. She has 1 grown son. Works full-time.   Caffeine Use: 2 cups daily; sodas occasionally   Health Maintenance  Topic Date Due  . Influenza Vaccine  07/27/2014  . Pap Smear  08/28/2016  . Tetanus/tdap  09/21/2023    The following portions of the patient's history were reviewed and updated as appropriate: allergies, current medications, past family history, past medical history, past social history, past surgical history and problem list.  Review of Systems Constitutional: positive for weight gain, negative for anorexia, chills, fevers and night sweats Eyes: positive for contacts/glasses and diabetic retinopathy-had 2 eye surgeries in past, negative for irritation and redness Ears, nose, mouth, throat, and face: itchy throat during spring, missing several teeth, recent dental exam Respiratory: negative for asthma, cough, dyspnea on exertion, pleurisy/chest pain, sputum and wheezing Cardiovascular: positive for lower extremity edema, negative for chest pain, chest  pressure/discomfort, irregular heart beat and near-syncope Gastrointestinal: positive for constipation and Hx of gastroparesis secondary to DM, negative for abdominal pain, change in bowel habits, nausea, reflux symptoms and vomiting Genitourinary:positive for frequent UTI & BV Integument/breast: positive for skin lesion(s) Musculoskeletal:positive for arthralgias, myalgias and knee pain when going up stairs, low back pain w/sitting Neurological: positive for headaches, negative for dizziness, gait problems, memory problems, seizures, speech problems, tremors and vertigo Behavioral/Psych: positive for sleep disturbance Endocrine: positive for diabetic symptoms including polyuria and poor wound healing, negative for temperature intolerance Allergic/Immunologic: positive for hay fever   Objective:    BP 140/88  Pulse 95  Temp(Src) 97.9 F (36.6 C) (Oral)  Ht 5' 8.17" (1.732 m)  Wt 241 lb (109.317 kg)  BMI 36.44 kg/m2  SpO2 96%  LMP 09/10/2013 General appearance: alert, cooperative, appears stated age and no distress Head: Normocephalic, without obvious abnormality, atraumatic Eyes: negative findings: lids and lashes normal, conjunctivae and sclerae normal, corneas clear and pupils equal, round, reactive to light and accomodation Ears: normal TM's and external ear canals both ears Nose: Nares normal. Septum midline. Mucosa normal. No drainage or sinus tenderness. Throat: abnormal findings: dentition: several teeth missing Neck: no adenopathy, no carotid bruit, no JVD, supple, symmetrical, trachea midline and thyroid not enlarged, symmetric, no tenderness/mass/nodules Back: no kyphosis present, tender palpation at lumbar spine Lungs: clear to auscultation bilaterally Heart: regular rate and rhythm, S1, S2 normal, no murmur, click, rub or gallop Abdomen: obese, pendulous, no HSM, NT Pelvic: deferred Extremities: edema +2 pitting from toes to knees,  bilat. and ulcer on R 2nd toe, black  lesion top of L foot, thick short nails. Pulses: 2+ and symmetric Skin: see extremities exam. multiple dark lesions on legs that look like post-inflammatory changes  ilt Lymph nodes: Cervical, supraclavicular, and axillary nodes normal. Neurologic: Alert and oriented X 3, normal strength and tone. Normal symmetric reflexes. Normal coordination and gait    Assessment:   1 peripheral edema- +1 pitting from toes to knees 2 ulcer R toe, lesion L foot 3 prev care-needs vaccines (tdap, pneumonia, flu) & MMG, guiac stool. See gyn, had PAP 9/14 4 constipation secondary to diabetic gastroparesis 5 chronic conditions-HTN, DM, CKD stage 2    Plan:   1 Enc walking daily, 10 minutes after each meal, salt restriction to 2400mg  daily. Will not add HCTZ as GFR 36 (contraindicated at 30). 2 ref to wound center. Wound care performed in office. Instructions given until sees wound center.  3 tdap & flu given today, will return in 1 mo for pneumonia, ref for MMG, stool card neg in ofc. 4 start align, miralax, eat fresh food daily. Consider seeing Dr Olevia Perches. 5 request records from nephrology, endo-continue to f/u . Will refer for MNT once records reviewed from Dr Deterding.

## 2013-09-20 NOTE — Patient Instructions (Addendum)
Once I receive Dr Deterding's records, I will refer you for medical nutrition therapy. Please see [facility] for your feet. Clean toe daily with mild soap & water. Thoroughly dry. Apply thin layer bacitracin & cover. Start Align probiotics-take daily.  Start miralax -1 capful daily dissolved in 4-8 oz water until you have BM. After BM, start again if no bm in 3 days. Eat something fresh everyday-fruit or veg.  Pleasure to see you today!  Preventive Care for Adults, Female A healthy lifestyle and preventive care can promote health and wellness. Preventive health guidelines for women include the following key practices.  A routine yearly physical is a good way to check with your caregiver about your health and preventive screening. It is a chance to share any concerns and updates on your health, and to receive a thorough exam.  Visit your dentist for a routine exam and preventive care every 6 months. Brush your teeth twice a day and floss once a day. Good oral hygiene prevents tooth decay and gum disease.  The frequency of eye exams is based on your age, health, family medical history, use of contact lenses, and other factors. Follow your caregiver's recommendations for frequency of eye exams.  Eat a healthy diet. Foods like vegetables, fruits, whole grains, low-fat dairy products, and lean protein foods contain the nutrients you need without too many calories. Decrease your intake of foods high in solid fats, added sugars, and salt. Eat the right amount of calories for you.Get information about a proper diet from your caregiver, if necessary.  Regular physical exercise is one of the most important things you can do for your health. Most adults should get at least 150 minutes of moderate-intensity exercise (any activity that increases your heart rate and causes you to sweat) each week. In addition, most adults need muscle-strengthening exercises on 2 or more days a week.  Maintain a  healthy weight. The body mass index (BMI) is a screening tool to identify possible weight problems. It provides an estimate of body fat based on height and weight. Your caregiver can help determine your BMI, and can help you achieve or maintain a healthy weight.For adults 20 years and older:  A BMI below 18.5 is considered underweight.  A BMI of 18.5 to 24.9 is normal.  A BMI of 25 to 29.9 is considered overweight.  A BMI of 30 and above is considered obese.  Maintain normal blood lipids and cholesterol levels by exercising and minimizing your intake of saturated fat. Eat a balanced diet with plenty of fruit and vegetables. Blood tests for lipids and cholesterol should begin at age 79 and be repeated every 5 years. If your lipid or cholesterol levels are high, you are over 50, or you are at high risk for heart disease, you may need your cholesterol levels checked more frequently.Ongoing high lipid and cholesterol levels should be treated with medicines if diet and exercise are not effective.  If you smoke, find out from your caregiver how to quit. If you do not use tobacco, do not start.  If you are pregnant, do not drink alcohol. If you are breastfeeding, be very cautious about drinking alcohol. If you are not pregnant and choose to drink alcohol, do not exceed 1 drink per day. One drink is considered to be 12 ounces (355 mL) of beer, 5 ounces (148 mL) of wine, or 1.5 ounces (44 mL) of liquor.  Avoid use of street drugs. Do not share needles with anyone.  Ask for help if you need support or instructions about stopping the use of drugs.  High blood pressure causes heart disease and increases the risk of stroke. Your blood pressure should be checked at least every 1 to 2 years. Ongoing high blood pressure should be treated with medicines if weight loss and exercise are not effective.  If you are 29 to 44 years old, ask your caregiver if you should take aspirin to prevent strokes.  Diabetes  screening involves taking a blood sample to check your fasting blood sugar level. This should be done once every 3 years, after age 77, if you are within normal weight and without risk factors for diabetes. Testing should be considered at a younger age or be carried out more frequently if you are overweight and have at least 1 risk factor for diabetes.  Breast cancer screening is essential preventive care for women. You should practice "breast self-awareness." This means understanding the normal appearance and feel of your breasts and may include breast self-examination. Any changes detected, no matter how small, should be reported to a caregiver. Women in their 75s and 30s should have a clinical breast exam (CBE) by a caregiver as part of a regular health exam every 1 to 3 years. After age 35, women should have a CBE every year. Starting at age 48, women should consider having a mammography (breast X-ray test) every year. Women who have a family history of breast cancer should talk to their caregiver about genetic screening. Women at a high risk of breast cancer should talk to their caregivers about having magnetic resonance imaging (MRI) and a mammography every year.  The Pap test is a screening test for cervical cancer. A Pap test can show cell changes on the cervix that might become cervical cancer if left untreated. A Pap test is a procedure in which cells are obtained and examined from the lower end of the uterus (cervix).  Women should have a Pap test starting at age 92.  Between ages 88 and 16, Pap tests should be repeated every 2 years.  Beginning at age 38, you should have a Pap test every 3 years as long as the past 3 Pap tests have been normal.  Some women have medical problems that increase the chance of getting cervical cancer. Talk to your caregiver about these problems. It is especially important to talk to your caregiver if a new problem develops soon after your last Pap test. In these  cases, your caregiver may recommend more frequent screening and Pap tests.  The above recommendations are the same for women who have or have not gotten the vaccine for human papillomavirus (HPV).  If you had a hysterectomy for a problem that was not cancer or a condition that could lead to cancer, then you no longer need Pap tests. Even if you no longer need a Pap test, a regular exam is a good idea to make sure no other problems are starting.  If you are between ages 44 and 39, and you have had normal Pap tests going back 10 years, you no longer need Pap tests. Even if you no longer need a Pap test, a regular exam is a good idea to make sure no other problems are starting.  If you have had past treatment for cervical cancer or a condition that could lead to cancer, you need Pap tests and screening for cancer for at least 20 years after your treatment.  If Pap tests have been  discontinued, risk factors (such as a new sexual partner) need to be reassessed to determine if screening should be resumed.  The HPV test is an additional test that may be used for cervical cancer screening. The HPV test looks for the virus that can cause the cell changes on the cervix. The cells collected during the Pap test can be tested for HPV. The HPV test could be used to screen women aged 19 years and older, and should be used in women of any age who have unclear Pap test results. After the age of 24, women should have HPV testing at the same frequency as a Pap test.  Colorectal cancer can be detected and often prevented. Most routine colorectal cancer screening begins at the age of 28 and continues through age 48. However, your caregiver may recommend screening at an earlier age if you have risk factors for colon cancer. On a yearly basis, your caregiver may provide home test kits to check for hidden blood in the stool. Use of a small camera at the end of a tube, to directly examine the colon (sigmoidoscopy or  colonoscopy), can detect the earliest forms of colorectal cancer. Talk to your caregiver about this at age 73, when routine screening begins. Direct examination of the colon should be repeated every 5 to 10 years through age 18, unless early forms of pre-cancerous polyps or small growths are found.  Hepatitis C blood testing is recommended for all people born from [birth year] through 1965 and any individual with known risks for hepatitis C.  Practice safe sex. Use condoms and avoid high-risk sexual practices to reduce the spread of sexually transmitted infections (STIs). STIs include gonorrhea, chlamydia, syphilis, trichomonas, herpes, HPV, and human immunodeficiency virus (HIV). Herpes, HIV, and HPV are viral illnesses that have no cure. They can result in disability, cancer, and death. Sexually active women aged 44 and younger should be checked for chlamydia. Older women with new or multiple partners should also be tested for chlamydia. Testing for other STIs is recommended if you are sexually active and at increased risk.  Osteoporosis is a disease in which the bones lose minerals and strength with aging. This can result in serious bone fractures. The risk of osteoporosis can be identified using a bone density scan. Women ages 71 and over and women at risk for fractures or osteoporosis should discuss screening with their caregivers. Ask your caregiver whether you should take a calcium supplement or vitamin D to reduce the rate of osteoporosis.  Menopause can be associated with physical symptoms and risks. Hormone replacement therapy is available to decrease symptoms and risks. You should talk to your caregiver about whether hormone replacement therapy is right for you.  Use sunscreen with sun protection factor (SPF) of 30 or more. Apply sunscreen liberally and repeatedly throughout the day. You should seek shade when your shadow is shorter than you. Protect yourself by wearing long sleeves, pants, a  wide-brimmed hat, and sunglasses year round, whenever you are outdoors.  Once a month, do a whole body skin exam, using a mirror to look at the skin on your back. Notify your caregiver of new moles, moles that have irregular borders, moles that are larger than a pencil eraser, or moles that have changed in shape or color.  Stay current with required immunizations.  Influenza. You need a dose every fall (or winter). The composition of the flu vaccine changes each year, so being vaccinated once is not enough.  Pneumococcal polysaccharide. You need  1 to 2 doses if you smoke cigarettes or if you have certain chronic medical conditions. You need 1 dose at age 62 (or older) if you have never been vaccinated.  Tetanus, diphtheria, pertussis (Tdap, Td). Get 1 dose of Tdap vaccine if you are younger than age 17, are over 32 and have contact with an infant, are a Dietitian, are pregnant, or simply want to be protected from whooping cough. After that, you need a Td booster dose every 10 years. Consult your caregiver if you have not had at least 3 tetanus and diphtheria-containing shots sometime in your life or have a deep or dirty wound.  HPV. You need this vaccine if you are a woman age 90 or younger. The vaccine is given in 3 doses over 6 months.  Measles, mumps, rubella (MMR). You need at least 1 dose of MMR if you were born in 1957 or later. You may also need a second dose.  Meningococcal. If you are age 35 to 28 and a first-year college student living in a residence hall, or have one of several medical conditions, you need to get vaccinated against meningococcal disease. You may also need additional booster doses.  Zoster (shingles). If you are age 67 or older, you should get this vaccine.  Varicella (chickenpox). If you have never had chickenpox or you were vaccinated but received only 1 dose, talk to your caregiver to find out if you need this vaccine.  Hepatitis A. You need this vaccine if  you have a specific risk factor for hepatitis A virus infection or you simply wish to be protected from this disease. The vaccine is usually given as 2 doses, 6 to 18 months apart.  Hepatitis B. You need this vaccine if you have a specific risk factor for hepatitis B virus infection or you simply wish to be protected from this disease. The vaccine is given in 3 doses, usually over 6 months. Preventive Services / Frequency Ages 52 to 90  Blood pressure check.** / Every 1 to 2 years.  Lipid and cholesterol check.** / Every 5 years beginning at age 1.  Clinical breast exam.** / Every 3 years for women in their 73s and 29s.  Pap test.** / Every 2 years from ages 100 through 88. Every 3 years starting at age 84 through age 53 or 62 with a history of 3 consecutive normal Pap tests.  HPV screening.** / Every 3 years from ages 37 through ages 45 to 86 with a history of 3 consecutive normal Pap tests.  Hepatitis C blood test.** / For any individual with known risks for hepatitis C.  Skin self-exam. / Monthly.  Influenza immunization.** / Every year.  Pneumococcal polysaccharide immunization.** / 1 to 2 doses if you smoke cigarettes or if you have certain chronic medical conditions.  Tetanus, diphtheria, pertussis (Tdap, Td) immunization. / A one-time dose of Tdap vaccine. After that, you need a Td booster dose every 10 years.  HPV immunization. / 3 doses over 6 months, if you are 13 and younger.  Measles, mumps, rubella (MMR) immunization. / You need at least 1 dose of MMR if you were born in 1957 or later. You may also need a second dose.  Meningococcal immunization. / 1 dose if you are age 37 to 41 and a first-year college student living in a residence hall, or have one of several medical conditions, you need to get vaccinated against meningococcal disease. You may also need additional booster doses.  Varicella  immunization.** / Consult your caregiver.  Hepatitis A immunization.** /  Consult your caregiver. 2 doses, 6 to 18 months apart.  Hepatitis B immunization.** / Consult your caregiver. 3 doses usually over 6 months. Ages 90 to 28  Blood pressure check.** / Every 1 to 2 years.  Lipid and cholesterol check.** / Every 5 years beginning at age 61.  Clinical breast exam.** / Every year after age 38.  Mammogram.** / Every year beginning at age 32 and continuing for as long as you are in good health. Consult with your caregiver.  Pap test.** / Every 3 years starting at age 7 through age 60 or 97 with a history of 3 consecutive normal Pap tests.  HPV screening.** / Every 3 years from ages 102 through ages 78 to 66 with a history of 3 consecutive normal Pap tests.  Fecal occult blood test (FOBT) of stool. / Every year beginning at age 8 and continuing until age 54. You may not need to do this test if you get a colonoscopy every 10 years.  Flexible sigmoidoscopy or colonoscopy.** / Every 5 years for a flexible sigmoidoscopy or every 10 years for a colonoscopy beginning at age 15 and continuing until age 76.  Hepatitis C blood test.** / For all people born from [birth year] through 1967 and any individual with known risks for hepatitis C.  Skin self-exam. / Monthly.  Influenza immunization.** / Every year.  Pneumococcal polysaccharide immunization.** / 1 to 2 doses if you smoke cigarettes or if you have certain chronic medical conditions.  Tetanus, diphtheria, pertussis (Tdap, Td) immunization.** / A one-time dose of Tdap vaccine. After that, you need a Td booster dose every 10 years.  Measles, mumps, rubella (MMR) immunization. / You need at least 1 dose of MMR if you were born in 1957 or later. You may also need a second dose.  Varicella immunization.** / Consult your caregiver.  Meningococcal immunization.** / Consult your caregiver.  Hepatitis A immunization.** / Consult your caregiver. 2 doses, 6 to 18 months apart.  Hepatitis B immunization.** / Consult your  caregiver. 3 doses, usually over 6 months. Ages 57 and over  Blood pressure check.** / Every 1 to 2 years.  Lipid and cholesterol check.** / Every 5 years beginning at age 8.  Clinical breast exam.** / Every year after age 107.  Mammogram.** / Every year beginning at age 75 and continuing for as long as you are in good health. Consult with your caregiver.  Pap test.** / Every 3 years starting at age 54 through age 30 or 64 with a 3 consecutive normal Pap tests. Testing can be stopped between 65 and 70 with 3 consecutive normal Pap tests and no abnormal Pap or HPV tests in the past 10 years.  HPV screening.** / Every 3 years from ages 57 through ages 31 or 9 with a history of 3 consecutive normal Pap tests. Testing can be stopped between 65 and 70 with 3 consecutive normal Pap tests and no abnormal Pap or HPV tests in the past 10 years.  Fecal occult blood test (FOBT) of stool. / Every year beginning at age 31 and continuing until age 78. You may not need to do this test if you get a colonoscopy every 10 years.  Flexible sigmoidoscopy or colonoscopy.** / Every 5 years for a flexible sigmoidoscopy or every 10 years for a colonoscopy beginning at age 40 and continuing until age 49.  Hepatitis C blood test.** / For all people born from  1947 through 1965 and any individual with known risks for hepatitis C.  Osteoporosis screening.** / A one-time screening for women ages 46 and over and women at risk for fractures or osteoporosis.  Skin self-exam. / Monthly.  Influenza immunization.** / Every year.  Pneumococcal polysaccharide immunization.** / 1 dose at age 69 (or older) if you have never been vaccinated.  Tetanus, diphtheria, pertussis (Tdap, Td) immunization. / A one-time dose of Tdap vaccine if you are over 65 and have contact with an infant, are a Dietitian, or simply want to be protected from whooping cough. After that, you need a Td booster dose every 10 years.  Varicella  immunization.** / Consult your caregiver.  Meningococcal immunization.** / Consult your caregiver.  Hepatitis A immunization.** / Consult your caregiver. 2 doses, 6 to 18 months apart.  Hepatitis B immunization.** / Check with your caregiver. 3 doses, usually over 6 months. ** Family history and personal history of risk and conditions may change your caregiver's recommendations. Document Released: 02/08/2002 Document Revised: 03/06/2012 Document Reviewed: 05/10/2011 [facility] Patient Information 2014 [place], [place].

## 2013-09-21 ENCOUNTER — Ambulatory Visit: Payer: Managed Care, Other (non HMO) | Admitting: Occupational Therapy

## 2013-09-25 ENCOUNTER — Ambulatory Visit: Payer: Managed Care, Other (non HMO) | Admitting: Occupational Therapy

## 2013-09-26 ENCOUNTER — Encounter (HOSPITAL_COMMUNITY)
Admission: RE | Admit: 2013-09-26 | Discharge: 2013-09-26 | Disposition: A | Discharge status: Home / Self Care | Payer: Managed Care, Other (non HMO) | Source: Ambulatory Visit | Attending: Nephrology | Admitting: Nephrology

## 2013-09-26 VITALS — BP 186/102 | HR 85 | Temp 98.3°F | Resp 18

## 2013-09-26 DIAGNOSIS — N182 Chronic kidney disease, stage 2 (mild): Secondary | ICD-10-CM | POA: Insufficient documentation

## 2013-09-26 MED ORDER — EPOETIN ALFA 20000 UNIT/ML IJ SOLN
INTRAMUSCULAR | Status: AC
  Filled 2013-09-26: qty 1

## 2013-09-26 MED ORDER — EPOETIN ALFA 20000 UNIT/ML IJ SOLN
20000.0000 [IU] | INTRAMUSCULAR | Status: DC
  Administered 2013-09-26: 16:00:00 20000 [IU] via SUBCUTANEOUS

## 2013-09-27 ENCOUNTER — Ambulatory Visit: Payer: Managed Care, Other (non HMO) | Attending: Nurse Practitioner | Admitting: Occupational Therapy

## 2013-09-27 DIAGNOSIS — IMO0001 Reserved for inherently not codable concepts without codable children: Secondary | ICD-10-CM | POA: Insufficient documentation

## 2013-09-27 DIAGNOSIS — M6281 Muscle weakness (generalized): Secondary | ICD-10-CM | POA: Insufficient documentation

## 2013-09-27 DIAGNOSIS — R279 Unspecified lack of coordination: Secondary | ICD-10-CM | POA: Insufficient documentation

## 2013-09-27 DIAGNOSIS — M25549 Pain in joints of unspecified hand: Secondary | ICD-10-CM | POA: Insufficient documentation

## 2013-10-02 ENCOUNTER — Ambulatory Visit: Payer: Managed Care, Other (non HMO) | Admitting: Occupational Therapy

## 2013-10-04 ENCOUNTER — Encounter (HOSPITAL_BASED_OUTPATIENT_CLINIC_OR_DEPARTMENT_OTHER): Payer: Managed Care, Other (non HMO)

## 2013-10-05 ENCOUNTER — Telehealth: Payer: Self-pay | Admitting: *Deleted

## 2013-10-05 ENCOUNTER — Encounter: Payer: Managed Care, Other (non HMO) | Admitting: Occupational Therapy

## 2013-10-05 NOTE — Telephone Encounter (Signed)
Pt was not treated at [facility] as wound is not open. I have set up appt. W/Dr Sharol Given to eval R 2nd toe & wound on top of L foot.

## 2013-10-05 NOTE — Telephone Encounter (Signed)
LMOVM, explained that we made her appointment for 10/08/13. Asked patient to call back this afternoon or first thing on the morning of 10/08/13.

## 2013-10-10 NOTE — Telephone Encounter (Signed)
Patient notified and is willing to go to Dr. Sharol Given.

## 2013-10-12 ENCOUNTER — Encounter: Payer: Managed Care, Other (non HMO) | Admitting: Occupational Therapy

## 2013-10-16 ENCOUNTER — Other Ambulatory Visit (HOSPITAL_COMMUNITY): Payer: Self-pay | Admitting: *Deleted

## 2013-10-16 ENCOUNTER — Ambulatory Visit (HOSPITAL_COMMUNITY): Payer: Managed Care, Other (non HMO) | Attending: Obstetrics and Gynecology

## 2013-10-17 ENCOUNTER — Encounter (HOSPITAL_COMMUNITY)
Admission: RE | Admit: 2013-10-17 | Discharge: 2013-10-17 | Disposition: A | Discharge status: Home / Self Care | Payer: Managed Care, Other (non HMO) | Source: Ambulatory Visit | Attending: Nephrology | Admitting: Nephrology

## 2013-10-17 VITALS — BP 132/78 | HR 100 | Resp 18

## 2013-10-17 DIAGNOSIS — N182 Chronic kidney disease, stage 2 (mild): Secondary | ICD-10-CM

## 2013-10-17 LAB — IRON AND TIBC
Iron: 113 ug/dL (ref 42–135)
TIBC: 262 ug/dL (ref 250–470)

## 2013-10-17 MED ORDER — EPOETIN ALFA 20000 UNIT/ML IJ SOLN
INTRAMUSCULAR | Status: AC
  Filled 2013-10-17: qty 1

## 2013-10-17 MED ORDER — EPOETIN ALFA 10000 UNIT/ML IJ SOLN
INTRAMUSCULAR | Status: AC
  Filled 2013-10-17: qty 1

## 2013-10-17 MED ORDER — EPOETIN ALFA 40000 UNIT/ML IJ SOLN
30000.0000 [IU] | INTRAMUSCULAR | Status: DC
  Administered 2013-10-17: 30000 [IU] via SUBCUTANEOUS

## 2013-10-18 ENCOUNTER — Ambulatory Visit: Payer: Managed Care, Other (non HMO)

## 2013-10-18 DIAGNOSIS — IMO0002 Reserved for concepts with insufficient information to code with codable children: Secondary | ICD-10-CM | POA: Insufficient documentation

## 2013-10-18 MED FILL — Epoetin Alfa Inj 20000 Unit/ML: INTRAMUSCULAR | Qty: 1 | Status: AC

## 2013-10-18 MED FILL — Epoetin Alfa Inj 10000 Unit/ML: INTRAMUSCULAR | Qty: 1 | Status: AC

## 2013-10-31 ENCOUNTER — Ambulatory Visit: Payer: Managed Care, Other (non HMO)

## 2013-11-05 ENCOUNTER — Encounter: Payer: Self-pay | Admitting: Obstetrics

## 2013-11-07 ENCOUNTER — Encounter (HOSPITAL_COMMUNITY): Payer: Managed Care, Other (non HMO)

## 2013-11-20 ENCOUNTER — Ambulatory Visit: Payer: Self-pay | Admitting: Nurse Practitioner

## 2013-11-27 ENCOUNTER — Encounter: Payer: Self-pay | Admitting: Nurse Practitioner

## 2013-11-27 ENCOUNTER — Other Ambulatory Visit: Payer: Self-pay | Admitting: Nurse Practitioner

## 2013-11-27 DIAGNOSIS — R011 Cardiac murmur, unspecified: Secondary | ICD-10-CM

## 2013-11-27 DIAGNOSIS — N2581 Secondary hyperparathyroidism of renal origin: Secondary | ICD-10-CM

## 2013-11-27 DIAGNOSIS — E0580 Other thyrotoxicosis without thyrotoxic crisis or storm: Secondary | ICD-10-CM

## 2013-11-27 DIAGNOSIS — E119 Type 2 diabetes mellitus without complications: Secondary | ICD-10-CM

## 2013-11-27 DIAGNOSIS — D638 Anemia in other chronic diseases classified elsewhere: Secondary | ICD-10-CM

## 2013-11-27 HISTORY — DX: Other thyrotoxicosis without thyrotoxic crisis or storm: E05.80

## 2013-12-05 ENCOUNTER — Encounter: Payer: Self-pay | Admitting: *Deleted

## 2013-12-05 LAB — LIPID PANEL
BUN: 31 mg/dL — AB (ref 4–21)
Calcium: 8.9 mg/dL
Chloride: 100 mmol/L
Phosphorus: 5.2 mg/dL — AB (ref 2.5–4.9)
Potassium: 4.3 mmol/L

## 2013-12-06 ENCOUNTER — Encounter: Payer: Self-pay | Admitting: Nurse Practitioner

## 2013-12-19 ENCOUNTER — Encounter: Payer: Self-pay | Admitting: Nurse Practitioner

## 2013-12-19 DIAGNOSIS — Z8742 Personal history of other diseases of the female genital tract: Secondary | ICD-10-CM | POA: Insufficient documentation

## 2013-12-19 DIAGNOSIS — Z124 Encounter for screening for malignant neoplasm of cervix: Secondary | ICD-10-CM | POA: Insufficient documentation

## 2013-12-21 ENCOUNTER — Ambulatory Visit: Payer: Managed Care, Other (non HMO) | Admitting: Nurse Practitioner

## 2013-12-25 ENCOUNTER — Encounter (HOSPITAL_COMMUNITY)
Admission: RE | Admit: 2013-12-25 | Discharge: 2013-12-25 | Disposition: A | Discharge status: Home / Self Care | Payer: Managed Care, Other (non HMO) | Source: Ambulatory Visit | Attending: Nephrology | Admitting: Nephrology

## 2013-12-25 VITALS — BP 181/99 | HR 100 | Temp 98.3°F | Resp 18

## 2013-12-25 DIAGNOSIS — N182 Chronic kidney disease, stage 2 (mild): Secondary | ICD-10-CM | POA: Insufficient documentation

## 2013-12-25 LAB — FERRITIN: Ferritin: 59 ng/mL (ref 10–291)

## 2013-12-25 LAB — POCT HEMOGLOBIN-HEMACUE: Hemoglobin: 9.7 g/dL — ABNORMAL LOW (ref 12.0–15.0)

## 2013-12-25 LAB — IRON AND TIBC
Iron: 55 ug/dL (ref 42–135)
Saturation Ratios: 20 % (ref 20–55)
TIBC: 278 ug/dL (ref 250–470)

## 2013-12-25 MED ORDER — EPOETIN ALFA 20000 UNIT/ML IJ SOLN
INTRAMUSCULAR | Status: AC
  Administered 2013-12-25: 15:00:00 20000 [IU] via SUBCUTANEOUS
  Filled 2013-12-25: qty 1

## 2013-12-25 MED ORDER — EPOETIN ALFA 40000 UNIT/ML IJ SOLN: 30000.0000 [IU] | INTRAMUSCULAR | Status: DC

## 2013-12-25 MED ORDER — EPOETIN ALFA 10000 UNIT/ML IJ SOLN
INTRAMUSCULAR | Status: AC
  Administered 2013-12-25: 10000 [IU] via SUBCUTANEOUS
  Filled 2013-12-25: qty 1

## 2014-01-14 ENCOUNTER — Other Ambulatory Visit (HOSPITAL_COMMUNITY): Payer: Self-pay | Admitting: *Deleted

## 2014-01-14 ENCOUNTER — Ambulatory Visit: Payer: Managed Care, Other (non HMO) | Admitting: Nurse Practitioner

## 2014-01-15 ENCOUNTER — Encounter (HOSPITAL_COMMUNITY): Payer: Managed Care, Other (non HMO)

## 2014-01-17 ENCOUNTER — Other Ambulatory Visit (HOSPITAL_COMMUNITY): Payer: Self-pay | Admitting: *Deleted

## 2014-01-18 ENCOUNTER — Encounter (HOSPITAL_COMMUNITY)
Admission: RE | Admit: 2014-01-18 | Discharge: 2014-01-18 | Disposition: A | Discharge status: Home / Self Care | Payer: Managed Care, Other (non HMO) | Source: Ambulatory Visit | Attending: Nephrology | Admitting: Nephrology

## 2014-01-18 VITALS — BP 166/88 | HR 80 | Temp 98.5°F | Resp 18

## 2014-01-18 DIAGNOSIS — N182 Chronic kidney disease, stage 2 (mild): Secondary | ICD-10-CM

## 2014-01-18 DIAGNOSIS — D509 Iron deficiency anemia, unspecified: Secondary | ICD-10-CM | POA: Insufficient documentation

## 2014-01-18 LAB — POCT HEMOGLOBIN-HEMACUE: Hemoglobin: 9.6 g/dL — ABNORMAL LOW (ref 12.0–15.0)

## 2014-01-18 MED ORDER — FERUMOXYTOL INJECTION 510 MG/17 ML
INTRAVENOUS | Status: AC
  Administered 2014-01-18: 510 mg via INTRAVENOUS
  Filled 2014-01-18: qty 17

## 2014-01-18 MED ORDER — FERUMOXYTOL INJECTION 510 MG/17 ML
510.0000 mg | Freq: Once | INTRAVENOUS | Status: AC
  Administered 2014-01-18: 510 mg via INTRAVENOUS

## 2014-01-18 MED ORDER — EPOETIN ALFA 20000 UNIT/ML IJ SOLN
INTRAMUSCULAR | Status: AC
  Administered 2014-01-18: 20000 [IU] via SUBCUTANEOUS
  Filled 2014-01-18: qty 1

## 2014-01-18 MED ORDER — EPOETIN ALFA 10000 UNIT/ML IJ SOLN
INTRAMUSCULAR | Status: AC
  Administered 2014-01-18: 10000 [IU] via SUBCUTANEOUS
  Filled 2014-01-18: qty 1

## 2014-01-18 MED ORDER — EPOETIN ALFA 40000 UNIT/ML IJ SOLN: 30000.0000 [IU] | INTRAMUSCULAR | Status: DC

## 2014-01-18 MED ORDER — SODIUM CHLORIDE 0.9 % IV SOLN
INTRAVENOUS | Status: DC
  Administered 2014-01-18: 15:00:00 via INTRAVENOUS

## 2014-02-04 ENCOUNTER — Encounter (HOSPITAL_COMMUNITY): Payer: Self-pay | Admitting: Emergency Medicine

## 2014-02-04 ENCOUNTER — Emergency Department (HOSPITAL_COMMUNITY): Payer: Managed Care, Other (non HMO)

## 2014-02-04 ENCOUNTER — Emergency Department (HOSPITAL_COMMUNITY)
Admission: EM | Admit: 2014-02-04 | Discharge: 2014-02-04 | Disposition: A | Discharge status: Home / Self Care | Payer: Managed Care, Other (non HMO) | Attending: Emergency Medicine | Admitting: Emergency Medicine

## 2014-02-04 DIAGNOSIS — N189 Chronic kidney disease, unspecified: Secondary | ICD-10-CM | POA: Insufficient documentation

## 2014-02-04 DIAGNOSIS — E119 Type 2 diabetes mellitus without complications: Secondary | ICD-10-CM | POA: Insufficient documentation

## 2014-02-04 DIAGNOSIS — N289 Disorder of kidney and ureter, unspecified: Secondary | ICD-10-CM

## 2014-02-04 DIAGNOSIS — Z79899 Other long term (current) drug therapy: Secondary | ICD-10-CM | POA: Insufficient documentation

## 2014-02-04 DIAGNOSIS — Z794 Long term (current) use of insulin: Secondary | ICD-10-CM | POA: Insufficient documentation

## 2014-02-04 DIAGNOSIS — Z8719 Personal history of other diseases of the digestive system: Secondary | ICD-10-CM | POA: Insufficient documentation

## 2014-02-04 DIAGNOSIS — R739 Hyperglycemia, unspecified: Secondary | ICD-10-CM

## 2014-02-04 DIAGNOSIS — Z862 Personal history of diseases of the blood and blood-forming organs and certain disorders involving the immune mechanism: Secondary | ICD-10-CM | POA: Insufficient documentation

## 2014-02-04 DIAGNOSIS — I129 Hypertensive chronic kidney disease with stage 1 through stage 4 chronic kidney disease, or unspecified chronic kidney disease: Secondary | ICD-10-CM | POA: Insufficient documentation

## 2014-02-04 DIAGNOSIS — Z87440 Personal history of urinary (tract) infections: Secondary | ICD-10-CM | POA: Insufficient documentation

## 2014-02-04 DIAGNOSIS — Z8742 Personal history of other diseases of the female genital tract: Secondary | ICD-10-CM | POA: Insufficient documentation

## 2014-02-04 DIAGNOSIS — IMO0001 Reserved for inherently not codable concepts without codable children: Secondary | ICD-10-CM | POA: Insufficient documentation

## 2014-02-04 DIAGNOSIS — J40 Bronchitis, not specified as acute or chronic: Secondary | ICD-10-CM | POA: Insufficient documentation

## 2014-02-04 DIAGNOSIS — I10 Essential (primary) hypertension: Secondary | ICD-10-CM

## 2014-02-04 LAB — CBC
HEMATOCRIT: 34.8 % — AB (ref 36.0–46.0)
Hemoglobin: 10.9 g/dL — ABNORMAL LOW (ref 12.0–15.0)
MCH: 27.8 pg (ref 26.0–34.0)
MCHC: 31.3 g/dL (ref 30.0–36.0)
MCV: 88.8 fL (ref 78.0–100.0)
Platelets: 242 10*3/uL (ref 150–400)
RBC: 3.92 MIL/uL (ref 3.87–5.11)
RDW: 14.6 % (ref 11.5–15.5)
WBC: 4.7 10*3/uL (ref 4.0–10.5)

## 2014-02-04 LAB — BASIC METABOLIC PANEL
BUN: 19 mg/dL (ref 6–23)
CHLORIDE: 101 meq/L (ref 96–112)
CO2: 23 mEq/L (ref 19–32)
Calcium: 8.8 mg/dL (ref 8.4–10.5)
Creatinine, Ser: 2.32 mg/dL — ABNORMAL HIGH (ref 0.50–1.10)
GFR calc Af Amer: 28 mL/min — ABNORMAL LOW (ref >90)
GFR calc non Af Amer: 24 mL/min — ABNORMAL LOW (ref >90)
Glucose, Bld: 358 mg/dL — ABNORMAL HIGH (ref 70–99)
POTASSIUM: 3.9 meq/L (ref 3.7–5.3)
Sodium: 135 mEq/L — ABNORMAL LOW (ref 137–147)

## 2014-02-04 LAB — POCT I-STAT TROPONIN I: Troponin i, poc: 0.02 ng/mL (ref 0.00–0.08)

## 2014-02-04 LAB — PRO B NATRIURETIC PEPTIDE: Pro B Natriuretic peptide (BNP): 192.7 pg/mL — ABNORMAL HIGH (ref 0–125)

## 2014-02-04 LAB — GLUCOSE, CAPILLARY: GLUCOSE-CAPILLARY: 227 mg/dL — AB (ref 70–99)

## 2014-02-04 MED ORDER — ONDANSETRON HCL 4 MG/2ML IJ SOLN
4.0000 mg | Freq: Once | INTRAMUSCULAR | Status: AC
  Administered 2014-02-04: 4 mg via INTRAVENOUS
  Filled 2014-02-04: qty 2

## 2014-02-04 MED ORDER — OXYCODONE-ACETAMINOPHEN 5-325 MG PO TABS
1.0000 | ORAL_TABLET | Freq: Once | ORAL | Status: DC
Start: 2014-02-04 — End: 2014-02-04
  Filled 2014-02-04: qty 1

## 2014-02-04 MED ORDER — ALBUTEROL SULFATE HFA 108 (90 BASE) MCG/ACT IN AERS
2.0000 | INHALATION_SPRAY | RESPIRATORY_TRACT | Status: DC | PRN
  Filled 2014-02-04: qty 6.7

## 2014-02-04 MED ORDER — MORPHINE SULFATE 4 MG/ML IJ SOLN
4.0000 mg | Freq: Once | INTRAMUSCULAR | Status: AC
  Administered 2014-02-04: 4 mg via INTRAVENOUS
  Filled 2014-02-04: qty 1

## 2014-02-04 MED ORDER — CLONIDINE HCL 0.1 MG PO TABS
0.1000 mg | ORAL_TABLET | Freq: Once | ORAL | Status: AC
  Administered 2014-02-04: 0.1 mg via ORAL
  Filled 2014-02-04: qty 1

## 2014-02-04 MED ORDER — INSULIN ASPART 100 UNIT/ML ~~LOC~~ SOLN
10.0000 [IU] | Freq: Once | SUBCUTANEOUS | Status: AC
  Administered 2014-02-04: 10 [IU] via SUBCUTANEOUS
  Filled 2014-02-04: qty 1

## 2014-02-04 MED ORDER — SODIUM CHLORIDE 0.9 % IV BOLUS (SEPSIS)
1000.0000 mL | Freq: Once | INTRAVENOUS | Status: AC
  Administered 2014-02-04: 1000 mL via INTRAVENOUS

## 2014-02-04 NOTE — ED Notes (Signed)
Attempted IV access, unsuccessful.  

## 2014-02-04 NOTE — Discharge Instructions (Signed)
Return to the ED with any concerns including chest pain, difficulty breathing, vomiting and not able to keep down liquids, fainting, decreased level of alertness/lethargy, or any other alarming symptoms  You should use the albuterol 2 puffs every 4 hours as needed for cough  You should have your blood pressure and your BUN/Creatinine checked in the next week by your primary doctor

## 2014-02-04 NOTE — ED Provider Notes (Signed)
CSN: [id number]     Arrival date & time 02/04/14  1305 History   First MD Initiated Contact with Patient 02/04/14 1546     Chief Complaint  Patient presents with  . Cough  . Chest Pain     (Consider location/radiation/quality/duration/timing/severity/associated sxs/prior Treatment) HPI Pt presenting with c/o body aches, headache, nonproductive cough and nasal congestion.  Pt states she has had similar symptoms for the past several weeks.  Was treated with tamiflu for influenza 2 weeks ago.  States her symptoms did not improve.  No fever/chills.  No vomiting but has had nausea.  No abdominal pain.  States her chest feels tight beginning last night.  No leg swelling.  Pt states her blood sugar has been running high, she also states she has been taking all her medications regularly.  There are no other associated systemic symptoms, there are no other alleviating or modifying factors.   Past Medical History  Diagnosis Date  . Gastroparesis   . Anemia   . IBS (irritable bowel syndrome)   . Nausea   . Hx: UTI (urinary tract infection)   . Fibroids     leiomyoma 8/04 per [name]  . Fibromyalgia   . Diabetes mellitus   . Hypertension   . Chronic kidney disease   . Allergy   . Blood transfusion without reported diagnosis   . Infertility     eval at [facility]   Past Surgical History  Procedure Laterality Date  . Eye surgery      bilateral, vitrectomies  . Fibroid tumors removed    . Foot surgery      bilateral   Family History  Problem Relation Age of Onset  . Colon polyps Mother   . Diabetes Mother   . Anesthesia problems Neg Hx   . Hypotension Neg Hx   . Malignant hyperthermia Neg Hx   . Pseudochol deficiency Neg Hx   . Heart murmur Father   . Hypertension Father   . Diabetes Sister   . Kidney failure Sister   . Diabetes Brother   . Retinal degeneration Brother   . Heart murmur Son   . Stroke Paternal Grandmother   . Diabetes Sister    History  Substance Use Topics  . Smoking  status: Never Smoker   . Smokeless tobacco: Never Used  . Alcohol Use: No   OB History   Grav Para Term Preterm Abortions TAB SAB Ect Mult Living   2 1   1  1   1      Review of Systems ROS reviewed and all otherwise negative except for mentioned in HPI    Allergies  Ibuprofen and Dilaudid  Home Medications   Current Outpatient Rx  Name  Route  Sig  Dispense  Refill  . ACCU-CHEK SMARTVIEW test strip               . amLODipine (NORVASC) 10 MG tablet   Oral   Take 10 mg by mouth daily.          . calcitRIOL (ROCALTROL) 0.25 MCG capsule   Oral   Take 0.25 mcg by mouth 2 (two) times daily.          . furosemide (LASIX) 80 MG tablet   Oral   Take 80 mg by mouth 2 (two) times daily as needed for fluid.          [name] lisinopril (PRINIVIL,ZESTRIL) 10 MG tablet   Oral   Take 20 mg by mouth  every evening.          [name] NOVOLIN 70/30 RELION (70-30) 100 UNIT/ML injection   Subcutaneous   Inject 45 Units into the skin 2 (two) times daily with a meal.          . pregabalin (LYRICA) 150 MG capsule   Oral   Take 300 mg by mouth at bedtime.           BP 211/116  Pulse 85  Temp(Src) 97.9 F (36.6 C) (Oral)  Resp 18  SpO2 97%  LMP 01/23/2014 Vitals reviewed Physical Exam Physical Examination: General appearance - alert, well appearing, and in no distress Mental status - alert, oriented to person, place, and time Eyes - no scleral icterus, no conjunctival injection Mouth - mucous membranes moist, pharynx normal without lesions Chest - clear to auscultation, no wheezes, rales or rhonchi, symmetric air entry Heart - normal rate, regular rhythm, normal S1, S2, no murmurs, rubs, clicks or gallops Abdomen - soft, nontender, nondistended, no masses or organomegaly Neurology- awake and alert, oriented x 3, strength 5/5 in extremities x 4, cranial nerves grossly intact Extremities - peripheral pulses normal, no pedal edema, no clubbing or cyanosis Skin - normal  coloration and turgor, no rashes  ED Course  Procedures (including critical care time) Labs Review Labs Reviewed  CBC - Abnormal; Notable for the following:    Hemoglobin 10.9 (*)    HCT 34.8 (*)    All other components within normal limits  BASIC METABOLIC PANEL - Abnormal; Notable for the following:    Sodium 135 (*)    Glucose, Bld 358 (*)    Creatinine, Ser 2.32 (*)    GFR calc non Af Amer 24 (*)    GFR calc Af Amer 28 (*)    All other components within normal limits  PRO B NATRIURETIC PEPTIDE - Abnormal; Notable for the following:    Pro B Natriuretic peptide (BNP) 192.7 (*)    All other components within normal limits  GLUCOSE, CAPILLARY - Abnormal; Notable for the following:    Glucose-Capillary 227 (*)    All other components within normal limits  POCT I-STAT TROPONIN I   Imaging Review Dg Chest 2 View (if Patient Has Fever And/or Copd)  02/04/2014   CLINICAL DATA:  Cough, congestion, fever, chest pain  EXAM: CHEST  2 VIEW  COMPARISON:  DG CHEST 2 VIEW dated 09/15/2013  FINDINGS: The heart size and mediastinal contours are within normal limits. Both lungs are clear. The visualized skeletal structures are unremarkable.  IMPRESSION: No active cardiopulmonary disease.   Electronically Signed   By: Kathreen Devoid   On: 02/04/2014 13:58    EKG Interpretation   None       MDM   Final diagnoses:  Hyperglycemia  Hypertension  Bronchitis    Pt presenting with c/o body aches, cough, nasal congestion, headache.  Also noted to be hypertensive and hyperglycemic.  BS in 200s after insulin and fluids.  Given clonidine and strongly encouraged to have bp rechecked by PMD.  Her renal insufficiency is midly worsened from her baseline.  She was advised to have this rechecked by her doctor in the next week as well.  Given albuterol MDI for likely post viral bronchitis.  Discharged with strict return precautions.  Pt agreeable with plan.   Threasa Beards, MD 02/04/14 [phone]

## 2014-02-04 NOTE — ED Notes (Addendum)
Pt states she has had flu-like sx with cough for past few weeks, began to have chest tightness last night.

## 2014-02-08 ENCOUNTER — Encounter (HOSPITAL_COMMUNITY)
Admission: RE | Admit: 2014-02-08 | Discharge: 2014-02-08 | Disposition: A | Discharge status: Home / Self Care | Payer: Managed Care, Other (non HMO) | Source: Ambulatory Visit | Attending: Nephrology | Admitting: Nephrology

## 2014-02-08 VITALS — BP 180/95 | HR 91 | Temp 97.2°F | Resp 18

## 2014-02-08 DIAGNOSIS — N182 Chronic kidney disease, stage 2 (mild): Secondary | ICD-10-CM

## 2014-02-08 MED ORDER — CLONIDINE HCL 0.1 MG PO TABS
ORAL_TABLET | ORAL | Status: AC
  Filled 2014-02-08: qty 1

## 2014-02-08 MED ORDER — CLONIDINE HCL 0.1 MG PO TABS
0.1000 mg | ORAL_TABLET | Freq: Once | ORAL | Status: AC | PRN
  Administered 2014-02-08: 0.1 mg via ORAL

## 2014-02-11 ENCOUNTER — Encounter (HOSPITAL_COMMUNITY): Payer: Managed Care, Other (non HMO)

## 2014-02-18 ENCOUNTER — Encounter (HOSPITAL_COMMUNITY)
Admission: RE | Admit: 2014-02-18 | Discharge: 2014-02-18 | Disposition: A | Discharge status: Home / Self Care | Payer: Managed Care, Other (non HMO) | Source: Ambulatory Visit | Attending: Nephrology | Admitting: Nephrology

## 2014-02-18 VITALS — BP 192/99 | HR 96 | Resp 18

## 2014-02-18 DIAGNOSIS — N182 Chronic kidney disease, stage 2 (mild): Secondary | ICD-10-CM

## 2014-02-18 MED ORDER — EPOETIN ALFA 40000 UNIT/ML IJ SOLN: 30000.0000 [IU] | INTRAMUSCULAR | Status: DC

## 2014-02-18 MED ORDER — CLONIDINE HCL 0.1 MG PO TABS
ORAL_TABLET | ORAL | Status: AC
  Filled 2014-02-18: qty 1

## 2014-02-18 MED ORDER — CLONIDINE HCL 0.1 MG PO TABS
0.1000 mg | ORAL_TABLET | Freq: Once | ORAL | Status: AC | PRN
  Administered 2014-02-18: 0.1 mg via ORAL

## 2014-02-18 NOTE — Progress Notes (Signed)
PT BP too high even after clonidine given.  Spoke with Peter Congo, CMA at dr Deterding's office.  Peter Congo stated she wants the pt to come into their office in 1-3 days for a BP check and she spoke with the pt to set this up at their office.  Pt will call us after she is seen at [facility] to reschedule her appt

## 2014-03-01 ENCOUNTER — Emergency Department (HOSPITAL_COMMUNITY): Payer: Managed Care, Other (non HMO)

## 2014-03-01 ENCOUNTER — Emergency Department (HOSPITAL_COMMUNITY)
Admission: EM | Admit: 2014-03-01 | Discharge: 2014-03-02 | Disposition: A | Discharge status: Home / Self Care | Payer: Managed Care, Other (non HMO) | Attending: Emergency Medicine | Admitting: Emergency Medicine

## 2014-03-01 ENCOUNTER — Encounter (HOSPITAL_COMMUNITY): Payer: Self-pay | Admitting: Emergency Medicine

## 2014-03-01 DIAGNOSIS — E1142 Type 2 diabetes mellitus with diabetic polyneuropathy: Secondary | ICD-10-CM | POA: Insufficient documentation

## 2014-03-01 DIAGNOSIS — L97519 Non-pressure chronic ulcer of other part of right foot with unspecified severity: Secondary | ICD-10-CM

## 2014-03-01 DIAGNOSIS — E1149 Type 2 diabetes mellitus with other diabetic neurological complication: Secondary | ICD-10-CM | POA: Insufficient documentation

## 2014-03-01 DIAGNOSIS — I129 Hypertensive chronic kidney disease with stage 1 through stage 4 chronic kidney disease, or unspecified chronic kidney disease: Secondary | ICD-10-CM | POA: Insufficient documentation

## 2014-03-01 DIAGNOSIS — Z862 Personal history of diseases of the blood and blood-forming organs and certain disorders involving the immune mechanism: Secondary | ICD-10-CM | POA: Insufficient documentation

## 2014-03-01 DIAGNOSIS — Z794 Long term (current) use of insulin: Secondary | ICD-10-CM | POA: Insufficient documentation

## 2014-03-01 DIAGNOSIS — Z8719 Personal history of other diseases of the digestive system: Secondary | ICD-10-CM | POA: Insufficient documentation

## 2014-03-01 DIAGNOSIS — E1169 Type 2 diabetes mellitus with other specified complication: Secondary | ICD-10-CM | POA: Insufficient documentation

## 2014-03-01 DIAGNOSIS — Z79899 Other long term (current) drug therapy: Secondary | ICD-10-CM | POA: Insufficient documentation

## 2014-03-01 DIAGNOSIS — E11621 Type 2 diabetes mellitus with foot ulcer: Secondary | ICD-10-CM

## 2014-03-01 DIAGNOSIS — N189 Chronic kidney disease, unspecified: Secondary | ICD-10-CM | POA: Insufficient documentation

## 2014-03-01 DIAGNOSIS — Z87440 Personal history of urinary (tract) infections: Secondary | ICD-10-CM | POA: Insufficient documentation

## 2014-03-01 DIAGNOSIS — L97409 Non-pressure chronic ulcer of unspecified heel and midfoot with unspecified severity: Secondary | ICD-10-CM | POA: Insufficient documentation

## 2014-03-01 LAB — CBC WITH DIFFERENTIAL/PLATELET
BASOS ABS: 0 10*3/uL (ref 0.0–0.1)
Basophils Relative: 0 % (ref 0–1)
Eosinophils Absolute: 0.2 10*3/uL (ref 0.0–0.7)
Eosinophils Relative: 2 % (ref 0–5)
HCT: 34.4 % — ABNORMAL LOW (ref 36.0–46.0)
Hemoglobin: 11.3 g/dL — ABNORMAL LOW (ref 12.0–15.0)
LYMPHS PCT: 36 % (ref 12–46)
Lymphs Abs: 3.1 10*3/uL (ref 0.7–4.0)
MCH: 27.8 pg (ref 26.0–34.0)
MCHC: 32.8 g/dL (ref 30.0–36.0)
MCV: 84.5 fL (ref 78.0–100.0)
Monocytes Absolute: 0.6 10*3/uL (ref 0.1–1.0)
Monocytes Relative: 7 % (ref 3–12)
NEUTROS ABS: 4.7 10*3/uL (ref 1.7–7.7)
NEUTROS PCT: 55 % (ref 43–77)
PLATELETS: 266 10*3/uL (ref 150–400)
RBC: 4.07 MIL/uL (ref 3.87–5.11)
RDW: 13.3 % (ref 11.5–15.5)
WBC: 8.5 10*3/uL (ref 4.0–10.5)

## 2014-03-01 LAB — COMPREHENSIVE METABOLIC PANEL
ALK PHOS: 86 U/L (ref 39–117)
ALT: 12 U/L (ref 0–35)
AST: 13 U/L (ref 0–37)
Albumin: 2.7 g/dL — ABNORMAL LOW (ref 3.5–5.2)
BUN: 21 mg/dL (ref 6–23)
CALCIUM: 8.9 mg/dL (ref 8.4–10.5)
CHLORIDE: 97 meq/L (ref 96–112)
CO2: 24 mEq/L (ref 19–32)
Creatinine, Ser: 2.34 mg/dL — ABNORMAL HIGH (ref 0.50–1.10)
GFR calc Af Amer: 28 mL/min — ABNORMAL LOW (ref >90)
GFR, EST NON AFRICAN AMERICAN: 24 mL/min — AB (ref >90)
GLUCOSE: 266 mg/dL — AB (ref 70–99)
POTASSIUM: 4.1 meq/L (ref 3.7–5.3)
SODIUM: 133 meq/L — AB (ref 137–147)
Total Protein: 7.4 g/dL (ref 6.0–8.3)

## 2014-03-01 LAB — CBG MONITORING, ED: Glucose-Capillary: 268 mg/dL — ABNORMAL HIGH (ref 70–99)

## 2014-03-01 MED ORDER — CEPHALEXIN 500 MG PO CAPS: 500.0000 mg | ORAL_CAPSULE | Freq: Four times a day (QID) | ORAL | Status: DC

## 2014-03-01 MED ORDER — MORPHINE SULFATE 2 MG/ML IJ SOLN
2.0000 mg | Freq: Once | INTRAMUSCULAR | Status: AC
  Administered 2014-03-02: 2 mg via INTRAMUSCULAR
  Filled 2014-03-01: qty 1

## 2014-03-01 MED ORDER — TRAMADOL HCL 50 MG PO TABS: 50.0000 mg | ORAL_TABLET | Freq: Four times a day (QID) | ORAL | Status: DC | PRN

## 2014-03-01 NOTE — ED Notes (Signed)
The pt has a wound on her  Rt foot with swelling to that lower extremity for 2 weeks.  [name] insulin dependent and she has not seen her doctor.  The foot is red and swollen

## 2014-03-01 NOTE — ED Provider Notes (Signed)
CSN: [id number]     Arrival date & time 03/01/14  2145 History   First MD Initiated Contact with Patient 03/01/14 2314     Chief Complaint  Patient presents with  . Foot Pain     (Consider location/radiation/quality/duration/timing/severity/associated sxs/prior Treatment) HPI Pt is a 43yo female with hx of diabetes and diabetic neuropathy c/o swelling and pain in right foot that has gradually worsened over last 2 weeks. Reports associated ulcer on back of right heal. Pain is aching and sore, 8/10 worse with ambulation and weight bearing.Has tried left over "wound cream" from previous foot ulcer w/o relief. No other home tx tried.   Pt reports previous hx of diabetic foot ulcers, however has not f/u with [facility] in over 1 year. Denies fever, n/v/d. Denies known injury.    Past Medical History  Diagnosis Date  . Gastroparesis   . Anemia   . IBS (irritable bowel syndrome)   . Nausea   . Hx: UTI (urinary tract infection)   . Fibroids     leiomyoma 8/04 per [name]  . Fibromyalgia   . Diabetes mellitus   . Hypertension   . Chronic kidney disease   . Allergy   . Blood transfusion without reported diagnosis   . Infertility     eval at [facility]   Past Surgical History  Procedure Laterality Date  . Eye surgery      bilateral, vitrectomies  . Fibroid tumors removed    . Foot surgery      bilateral   Family History  Problem Relation Age of Onset  . Colon polyps Mother   . Diabetes Mother   . Anesthesia problems Neg Hx   . Hypotension Neg Hx   . Malignant hyperthermia Neg Hx   . Pseudochol deficiency Neg Hx   . Heart murmur Father   . Hypertension Father   . Diabetes Sister   . Kidney failure Sister   . Diabetes Brother   . Retinal degeneration Brother   . Heart murmur Son   . Stroke Paternal Grandmother   . Diabetes Sister    History  Substance Use Topics  . Smoking status: Never Smoker   . Smokeless tobacco: Never Used  . Alcohol Use: No   OB History   Grav  Para Term Preterm Abortions TAB SAB Ect Mult Living   2 1   1  1   1      Review of Systems  Constitutional: Negative for fever and chills.  Cardiovascular: Positive for leg swelling. Negative for chest pain and palpitations.  Gastrointestinal: Negative for nausea, vomiting and diarrhea.  Musculoskeletal: Positive for myalgias.  Skin: Positive for color change and wound. Negative for rash.  Neurological: Negative for numbness.  All other systems reviewed and are negative.      Allergies  Ibuprofen and Dilaudid  Home Medications   Current Outpatient Rx  Name  Route  Sig  Dispense  Refill  . ACCU-CHEK SMARTVIEW test strip               . amLODipine (NORVASC) 10 MG tablet   Oral   Take 10 mg by mouth daily.          . calcitRIOL (ROCALTROL) 0.25 MCG capsule   Oral   Take 0.25 mcg by mouth 2 (two) times daily.          . furosemide (LASIX) 80 MG tablet   Oral   Take 80 mg by mouth 2 (two) times  daily as needed for fluid.          [name] lisinopril (PRINIVIL,ZESTRIL) 10 MG tablet   Oral   Take 20 mg by mouth every evening.          [name] NOVOLIN 70/30 RELION (70-30) 100 UNIT/ML injection   Subcutaneous   Inject 45 Units into the skin 2 (two) times daily with a meal.          . pregabalin (LYRICA) 150 MG capsule   Oral   Take 300 mg by mouth at bedtime.          . cephALEXin (KEFLEX) 500 MG capsule   Oral   Take 1 capsule (500 mg total) by mouth 4 (four) times daily.   40 capsule   0   . traMADol (ULTRAM) 50 MG tablet   Oral   Take 1 tablet (50 mg total) by mouth every 6 (six) hours as needed.   15 tablet   0    BP 179/98  Pulse 92  Temp(Src) 98.1 F (36.7 C) (Oral)  Resp 18  Ht 5\' 9"  (1.753 m)  SpO2 98%  LMP 02/22/2014 Physical Exam  Nursing note and vitals reviewed. Constitutional: She appears well-developed and well-nourished. No distress.  HENT:  Head: Normocephalic and atraumatic.  Eyes: Conjunctivae are normal. No scleral icterus.   Neck: Normal range of motion.  Cardiovascular: Normal rate, regular rhythm and normal heart sounds.   Pulmonary/Chest: Effort normal and breath sounds normal. No respiratory distress. She has no wheezes. She has no rales. She exhibits no tenderness.  Abdominal: Soft. Bowel sounds are normal. She exhibits no distension and no mass. There is no tenderness. There is no rebound and no guarding.  Musculoskeletal: Normal range of motion. She exhibits edema (3+ pitting edema right leg to mid-shin.  2+ pitting edema left leg to mid-shin.) and tenderness.  Mild tenderness to lateral aspect of right foot close to diabetic ulcer.  No calf tenderness, erythema or warmth  Neurological: [name] alert.  Skin: Skin is warm and dry. [name] not diaphoretic. No erythema.  Ulceration to posterior heel of right foot. Surrounding hyperpigmented skin. No red streaking, warmth, or induration.  DP pulse 2+.      ED Course  Procedures (including critical care time) Labs Review Labs Reviewed  CBC WITH DIFFERENTIAL - Abnormal; Notable for the following:    Hemoglobin 11.3 (*)    HCT 34.4 (*)    All other components within normal limits  COMPREHENSIVE METABOLIC PANEL - Abnormal; Notable for the following:    Sodium 133 (*)    Glucose, Bld 266 (*)    Creatinine, Ser 2.34 (*)    Albumin 2.7 (*)    Total Bilirubin <0.2 (*)    GFR calc non Af Amer 24 (*)    GFR calc Af Amer 28 (*)    All other components within normal limits  CBG MONITORING, ED - Abnormal; Notable for the following:    Glucose-Capillary 268 (*)    All other components within normal limits   Imaging Review Dg Foot Complete Right  03/01/2014   CLINICAL DATA:  Wound on heel, nonhealing.  EXAM: RIGHT FOOT COMPLETE - 3+ VIEW  COMPARISON:  02/22/2013  FINDINGS: Smaller soft tissue ulcer on the posterior heel. No changes of osteomyelitis. No radiodense foreign body. No fracture malalignment. Unchanged widening of the third and fourth the PIP joints,  likely postsurgical.  IMPRESSION: No evidence of osteomyelitis or foreign body.   Electronically  Signed   By: Jorje Guild M.D.   On: 03/01/2014 23:17     EKG Interpretation None      MDM   Final diagnoses:  Diabetic ulcer of right foot    Pt is a 43yo female with hx of diabetes c/o worsening right leg and foot swelling with pain x2 weeks. Denies fever, n/v/d. Pt does have ulceration to posterior right heel.  Vitals: WNL, pt is afebrile. Appears well.  Labs: unremarkable besides elevated CBG-268, pt reports normal CBG is 200s-300s "at times"   Plain films: no evidence of osteomyelitis or foreign body.  Do not believe pt needs admittance for IV antibiotics at this time. Rx: keflex and tramadol. Advised pt to f/u with PCP, Wound Care, or [facility] (wherever she can find an appointment next week) for f/u of ulcer.  Return precautions provided. Pt verbalized understanding and agreement with tx plan.     Noland Fordyce, PA-C 03/02/14 0006

## 2014-03-01 NOTE — Discharge Instructions (Signed)
It is very important to take all of your antibiotics as prescribed. Follow up with primary care, wound center, or [facility] by next week for further evaluation and treatment of right foot.  Be sure to keep legs elevated above heart several times a day to help with swelling.  Return to ER if symptoms worsening.  See further instruction below.

## 2014-03-03 NOTE — ED Provider Notes (Signed)
Medical screening examination/treatment/procedure(s) were performed by non-physician practitioner and as supervising physician I was immediately available for consultation/collaboration.   Teressa Lower, MD 03/03/14 [phone]

## 2014-03-18 ENCOUNTER — Encounter (HOSPITAL_COMMUNITY): Payer: Self-pay | Admitting: Emergency Medicine

## 2014-03-18 ENCOUNTER — Emergency Department (HOSPITAL_COMMUNITY)
Admission: EM | Admit: 2014-03-18 | Discharge: 2014-03-19 | Disposition: A | Discharge status: Home / Self Care | Payer: Managed Care, Other (non HMO) | Attending: Emergency Medicine | Admitting: Emergency Medicine

## 2014-03-18 ENCOUNTER — Encounter: Payer: Self-pay | Admitting: Nurse Practitioner

## 2014-03-18 DIAGNOSIS — Z794 Long term (current) use of insulin: Secondary | ICD-10-CM | POA: Insufficient documentation

## 2014-03-18 DIAGNOSIS — M549 Dorsalgia, unspecified: Secondary | ICD-10-CM

## 2014-03-18 DIAGNOSIS — E119 Type 2 diabetes mellitus without complications: Secondary | ICD-10-CM | POA: Insufficient documentation

## 2014-03-18 DIAGNOSIS — Z792 Long term (current) use of antibiotics: Secondary | ICD-10-CM | POA: Insufficient documentation

## 2014-03-18 DIAGNOSIS — Z8742 Personal history of other diseases of the female genital tract: Secondary | ICD-10-CM | POA: Insufficient documentation

## 2014-03-18 DIAGNOSIS — R739 Hyperglycemia, unspecified: Secondary | ICD-10-CM

## 2014-03-18 DIAGNOSIS — Z3202 Encounter for pregnancy test, result negative: Secondary | ICD-10-CM | POA: Insufficient documentation

## 2014-03-18 DIAGNOSIS — Z862 Personal history of diseases of the blood and blood-forming organs and certain disorders involving the immune mechanism: Secondary | ICD-10-CM | POA: Insufficient documentation

## 2014-03-18 DIAGNOSIS — M545 Low back pain, unspecified: Secondary | ICD-10-CM | POA: Insufficient documentation

## 2014-03-18 DIAGNOSIS — Z8719 Personal history of other diseases of the digestive system: Secondary | ICD-10-CM | POA: Insufficient documentation

## 2014-03-18 DIAGNOSIS — N189 Chronic kidney disease, unspecified: Secondary | ICD-10-CM | POA: Insufficient documentation

## 2014-03-18 DIAGNOSIS — I129 Hypertensive chronic kidney disease with stage 1 through stage 4 chronic kidney disease, or unspecified chronic kidney disease: Secondary | ICD-10-CM | POA: Insufficient documentation

## 2014-03-18 DIAGNOSIS — Z79899 Other long term (current) drug therapy: Secondary | ICD-10-CM | POA: Insufficient documentation

## 2014-03-18 DIAGNOSIS — Z87440 Personal history of urinary (tract) infections: Secondary | ICD-10-CM | POA: Insufficient documentation

## 2014-03-18 LAB — COMPREHENSIVE METABOLIC PANEL
ALBUMIN: 2.7 g/dL — AB (ref 3.5–5.2)
ALT: 18 U/L (ref 0–35)
AST: 18 U/L (ref 0–37)
Alkaline Phosphatase: 87 U/L (ref 39–117)
BUN: 27 mg/dL — ABNORMAL HIGH (ref 6–23)
CALCIUM: 9 mg/dL (ref 8.4–10.5)
CO2: 24 mEq/L (ref 19–32)
Chloride: 97 mEq/L (ref 96–112)
Creatinine, Ser: 2.46 mg/dL — ABNORMAL HIGH (ref 0.50–1.10)
GFR calc non Af Amer: 23 mL/min — ABNORMAL LOW (ref >90)
GFR, EST AFRICAN AMERICAN: 26 mL/min — AB (ref >90)
GLUCOSE: 394 mg/dL — AB (ref 70–99)
Potassium: 4.2 mEq/L (ref 3.7–5.3)
SODIUM: 134 meq/L — AB (ref 137–147)
Total Bilirubin: <0.2 mg/dL — ABNORMAL LOW (ref 0.3–1.2)
Total Protein: 7.6 g/dL (ref 6.0–8.3)

## 2014-03-18 LAB — URINE MICROSCOPIC-ADD ON

## 2014-03-18 LAB — LIPASE, BLOOD: LIPASE: 62 U/L — AB (ref 11–59)

## 2014-03-18 LAB — CBC WITH DIFFERENTIAL/PLATELET
BASOS PCT: 0 % (ref 0–1)
Basophils Absolute: 0 10*3/uL (ref 0.0–0.1)
EOS ABS: 0.1 10*3/uL (ref 0.0–0.7)
Eosinophils Relative: 2 % (ref 0–5)
HCT: 34 % — ABNORMAL LOW (ref 36.0–46.0)
Hemoglobin: 11.2 g/dL — ABNORMAL LOW (ref 12.0–15.0)
LYMPHS ABS: 2.2 10*3/uL (ref 0.7–4.0)
Lymphocytes Relative: 41 % (ref 12–46)
MCH: 27.9 pg (ref 26.0–34.0)
MCHC: 32.9 g/dL (ref 30.0–36.0)
MCV: 84.6 fL (ref 78.0–100.0)
Monocytes Absolute: 0.4 10*3/uL (ref 0.1–1.0)
Monocytes Relative: 6 % (ref 3–12)
Neutro Abs: 2.8 10*3/uL (ref 1.7–7.7)
Neutrophils Relative %: 51 % (ref 43–77)
PLATELETS: 222 10*3/uL (ref 150–400)
RBC: 4.02 MIL/uL (ref 3.87–5.11)
RDW: 13.5 % (ref 11.5–15.5)
WBC: 5.5 10*3/uL (ref 4.0–10.5)

## 2014-03-18 LAB — URINALYSIS, ROUTINE W REFLEX MICROSCOPIC
Bilirubin Urine: NEGATIVE
Glucose, UA: >1000 mg/dL — AB
Ketones, ur: NEGATIVE mg/dL
LEUKOCYTES UA: NEGATIVE
NITRITE: NEGATIVE
Protein, ur: >300 mg/dL — AB
Specific Gravity, Urine: 1.022 (ref 1.005–1.030)
UROBILINOGEN UA: 0.2 mg/dL (ref 0.0–1.0)
pH: 5.5 (ref 5.0–8.0)

## 2014-03-18 LAB — PREGNANCY, URINE: Preg Test, Ur: NEGATIVE

## 2014-03-18 MED ORDER — CYCLOBENZAPRINE HCL 10 MG PO TABS: 10.0000 mg | ORAL_TABLET | Freq: Three times a day (TID) | ORAL | Status: DC | PRN

## 2014-03-18 MED ORDER — INSULIN ASPART 100 UNIT/ML ~~LOC~~ SOLN
10.0000 [IU] | Freq: Once | SUBCUTANEOUS | Status: AC
  Administered 2014-03-18: 10 [IU] via SUBCUTANEOUS
  Filled 2014-03-18: qty 1

## 2014-03-18 MED ORDER — CYCLOBENZAPRINE HCL 10 MG PO TABS
10.0000 mg | ORAL_TABLET | Freq: Once | ORAL | Status: AC
  Administered 2014-03-18: 10 mg via ORAL
  Filled 2014-03-18: qty 1

## 2014-03-18 NOTE — ED Provider Notes (Signed)
CSN: [id number]     Arrival date & time 03/18/14  1749 History   First MD Initiated Contact with Patient 03/18/14 1951     Chief Complaint  Patient presents with  . Flank Pain     (Consider location/radiation/quality/duration/timing/severity/associated sxs/prior Treatment) Patient is a 43 y.o. female presenting with flank pain.  Flank Pain   Pt with history of multiple medical problems reports 2 days of low back pain, worse with movement, not improved with APAP. She has had elevated sugars at home on 70/30. Denies any dysuria, hematuria, vomiting, diarrhea or fever.   Past Medical History  Diagnosis Date  . Gastroparesis   . Anemia   . IBS (irritable bowel syndrome)   . Nausea   . Hx: UTI (urinary tract infection)   . Fibroids     leiomyoma 8/04 per [name]  . Fibromyalgia   . Diabetes mellitus   . Hypertension   . Chronic kidney disease   . Allergy   . Blood transfusion without reported diagnosis   . Infertility     eval at [facility]   Past Surgical History  Procedure Laterality Date  . Eye surgery      bilateral, vitrectomies  . Fibroid tumors removed    . Foot surgery      bilateral   Family History  Problem Relation Age of Onset  . Colon polyps Mother   . Diabetes Mother   . Anesthesia problems Neg Hx   . Hypotension Neg Hx   . Malignant hyperthermia Neg Hx   . Pseudochol deficiency Neg Hx   . Heart murmur Father   . Hypertension Father   . Diabetes Sister   . Kidney failure Sister   . Diabetes Brother   . Retinal degeneration Brother   . Heart murmur Son   . Stroke Paternal Grandmother   . Diabetes Sister    History  Substance Use Topics  . Smoking status: Never Smoker   . Smokeless tobacco: Never Used  . Alcohol Use: No   OB History   Grav Para Term Preterm Abortions TAB SAB Ect Mult Living   2 1   1  1   1      Review of Systems  Genitourinary: Positive for flank pain.   All other systems reviewed and are negative except as noted in HPI.      Allergies  Ibuprofen and Dilaudid  Home Medications   Current Outpatient Rx  Name  Route  Sig  Dispense  Refill  . amLODipine (NORVASC) 10 MG tablet   Oral   Take 10 mg by mouth at bedtime.          . calcitRIOL (ROCALTROL) 0.25 MCG capsule   Oral   Take 0.5 mcg by mouth at bedtime.          . cephALEXin (KEFLEX) 500 MG capsule   Oral   Take 1 capsule (500 mg total) by mouth 4 (four) times daily.   40 capsule   0   . furosemide (LASIX) 80 MG tablet   Oral   Take 160 mg by mouth at bedtime.          [name] lisinopril (PRINIVIL,ZESTRIL) 10 MG tablet   Oral   Take 20 mg by mouth every evening.          [name] NOVOLIN 70/30 RELION (70-30) 100 UNIT/ML injection   Subcutaneous   Inject 45 Units into the skin 2 (two) times daily with a meal.          .  pregabalin (LYRICA) 150 MG capsule   Oral   Take 300 mg by mouth at bedtime.          [name] ACCU-CHEK SMARTVIEW test strip               . traMADol (ULTRAM) 50 MG tablet   Oral   Take 1 tablet (50 mg total) by mouth every 6 (six) hours as needed.   15 tablet   0    BP 177/97  Pulse 98  Temp(Src) 98 F (36.7 C) (Oral)  Resp 18  SpO2 98%  LMP 02/22/2014 Physical Exam  Nursing note and vitals reviewed. Constitutional: [name] oriented to person, place, and time. She appears well-developed and well-nourished.  HENT:  Head: Normocephalic and atraumatic.  Eyes: EOM are normal. Pupils are equal, round, and reactive to light.  Neck: Normal range of motion. Neck supple.  Cardiovascular: Normal rate, normal heart sounds and intact distal pulses.   Pulmonary/Chest: Effort normal and breath sounds normal.  Abdominal: Bowel sounds are normal. She exhibits no distension. There is no tenderness.  Musculoskeletal: Normal range of motion. She exhibits tenderness (soft tissue tenderness diffuse lumbar spine). She exhibits no edema.  Neurological: [name] alert and oriented to person, place, and time. She has normal  strength. No cranial nerve deficit or sensory deficit.  Skin: Skin is warm and dry. No rash noted.  Psychiatric: She has a normal mood and affect.    ED Course  Procedures (including critical care time) Labs Review Labs Reviewed  CBC WITH DIFFERENTIAL - Abnormal; Notable for the following:    Hemoglobin 11.2 (*)    HCT 34.0 (*)    All other components within normal limits  COMPREHENSIVE METABOLIC PANEL - Abnormal; Notable for the following:    Sodium 134 (*)    Glucose, Bld 394 (*)    BUN 27 (*)    Creatinine, Ser 2.46 (*)    Albumin 2.7 (*)    Total Bilirubin <0.2 (*)    GFR calc non Af Amer 23 (*)    GFR calc Af Amer 26 (*)    All other components within normal limits  LIPASE, BLOOD - Abnormal; Notable for the following:    Lipase 62 (*)    All other components within normal limits  URINALYSIS, ROUTINE W REFLEX MICROSCOPIC - Abnormal; Notable for the following:    Glucose, UA >1000 (*)    Hgb urine dipstick SMALL (*)    Protein, ur >300 (*)    All other components within normal limits  URINE MICROSCOPIC-ADD ON - Abnormal; Notable for the following:    Squamous Epithelial / LPF MANY (*)    Bacteria, UA FEW (*)    Casts HYALINE CASTS (*)    All other components within normal limits  PREGNANCY, URINE   Imaging Review No results found.   EKG Interpretation None      MDM   Final diagnoses:  Back pain  Hyperglycemia    Labs done in triage show hyperglycemia and CKD but otherwise unremarkable. Back pain is likely MSK in etiology. She has had prior narcotic addiction, will treat with APAP and flexeril. Additional insulin here and discuss DM management with PCP.    Charles B. Karle Starch, MD 03/18/14 2037

## 2014-03-18 NOTE — ED Notes (Signed)
Pt states that she has been having L sided flank pain x 2 days. Hx of CKD. Denies change in urination. Ambulatory. Alert and oriented.

## 2014-03-18 NOTE — Discharge Instructions (Signed)
Back Pain, Adult Low back pain is very common. About 1 in 5 people have back pain.The cause of low back pain is rarely dangerous. The pain often gets better over time.About half of people with a sudden onset of back pain feel better in just 2 weeks. About 8 in 10 people feel better by 6 weeks.  CAUSES Some common causes of back pain include:  Strain of the muscles or ligaments supporting the spine.  Wear and tear (degeneration) of the spinal discs.  Arthritis.  Direct injury to the back. DIAGNOSIS Most of the time, the direct cause of low back pain is not known.However, back pain can be treated effectively even when the exact cause of the pain is unknown.Answering your caregiver's questions about your overall health and symptoms is one of the most accurate ways to make sure the cause of your pain is not dangerous. If your caregiver needs more information, he or she may order lab work or imaging tests (X-rays or MRIs).However, even if imaging tests show changes in your back, this usually does not require surgery. HOME CARE INSTRUCTIONS For many people, back pain returns.Since low back pain is rarely dangerous, it is often a condition that people can learn to [facility] their own.   Remain active. It is stressful on the back to sit or stand in one place. Do not sit, drive, or stand in one place for more than 30 minutes at a time. Take short walks on level surfaces as soon as pain allows.Try to increase the length of time you walk each day.  Do not stay in bed.Resting more than 1 or 2 days can delay your recovery.  Do not avoid exercise or work.Your body is made to move.It is not dangerous to be active, even though your back may hurt.Your back will likely heal faster if you return to being active before your pain is gone.  Pay attention to your body when you bend and lift. Many people have less discomfortwhen lifting if they bend their knees, keep the load close to their bodies,and  avoid twisting. Often, the most comfortable positions are those that put less stress on your recovering back.  Find a comfortable position to sleep. Use a firm mattress and lie on your side with your knees slightly bent. If you lie on your back, put a pillow under your knees.  Only take over-the-counter or prescription medicines as directed by your caregiver. Over-the-counter medicines to reduce pain and inflammation are often the most helpful.Your caregiver may prescribe muscle relaxant drugs.These medicines help dull your pain so you can more quickly return to your normal activities and healthy exercise.  Put ice on the injured area.  Put ice in a plastic bag.  Place a towel between your skin and the bag.  Leave the ice on for 15-20 minutes, 03-04 times a day for the first 2 to 3 days. After that, ice and heat may be alternated to reduce pain and spasms.  Ask your caregiver about trying back exercises and gentle massage. This may be of some benefit.  Avoid feeling anxious or stressed.Stress increases muscle tension and can worsen back pain.It is important to recognize when you are anxious or stressed and learn ways to manage it.Exercise is a great option. SEEK MEDICAL CARE IF:  You have pain that is not relieved with rest or medicine.  You have pain that does not improve in 1 week.  You have new symptoms.  You are generally not feeling well. SEEK  IMMEDIATE MEDICAL CARE IF:   You have pain that radiates from your back into your legs.  You develop new bowel or bladder control problems.  You have unusual weakness or numbness in your arms or legs.  You develop nausea or vomiting.  You develop abdominal pain.  You feel faint. Document Released: 12/13/2005 Document Revised: 06/13/2012 Document Reviewed: 05/03/2011 [facility] Patient Information 2014 [place], [place].  Hyperglycemia Hyperglycemia occurs when the glucose (sugar) in your blood is too high. Hyperglycemia can  happen for many reasons, but it most often happens to people who do not know they have diabetes or are not managing their diabetes properly.  CAUSES  Whether you have diabetes or not, there are other causes of hyperglycemia. Hyperglycemia can occur when you have diabetes, but it can also occur in other situations that you might not be as aware of, such as: Diabetes  If you have diabetes and are having problems controlling your blood glucose, hyperglycemia could occur because of some of the following reasons:  Not following your meal plan.  Not taking your diabetes medications or not taking it properly.  Exercising less or doing less activity than you normally do.  Being sick. Pre-diabetes  This cannot be ignored. Before people develop Type 2 diabetes, they almost always have "pre-diabetes." This is when your blood glucose levels are higher than normal, but not yet high enough to be diagnosed as diabetes. Research has shown that some long-term damage to the body, especially the heart and circulatory system, may already be occurring during pre-diabetes. If you take action to manage your blood glucose when you have pre-diabetes, you may delay or prevent Type 2 diabetes from developing. Stress  If you have diabetes, you may be "diet" controlled or on oral medications or insulin to control your diabetes. However, you may find that your blood glucose is higher than usual in the hospital whether you have diabetes or not. This is often referred to as "stress hyperglycemia." Stress can elevate your blood glucose. This happens because of hormones put out by the body during times of stress. If stress has been the cause of your high blood glucose, it can be followed regularly by your caregiver. That way he/she can make sure your hyperglycemia does not continue to get worse or progress to diabetes. Steroids  Steroids are medications that act on the infection fighting system (immune system) to block  inflammation or infection. One side effect can be a rise in blood glucose. Most people can produce enough extra insulin to allow for this rise, but for those who cannot, steroids make blood glucose levels go even higher. It is not unusual for steroid treatments to "uncover" diabetes that is developing. It is not always possible to determine if the hyperglycemia will go away after the steroids are stopped. A special blood test called an A1c is sometimes done to determine if your blood glucose was elevated before the steroids were started. SYMPTOMS  Thirsty.  Frequent urination.  Dry mouth.  Blurred vision.  Tired or fatigue.  Weakness.  Sleepy.  Tingling in feet or leg. DIAGNOSIS  Diagnosis is made by monitoring blood glucose in one or all of the following ways:  A1c test. This is a chemical found in your blood.  Fingerstick blood glucose monitoring.  Laboratory results. TREATMENT  First, knowing the cause of the hyperglycemia is important before the hyperglycemia can be treated. Treatment may include, but is not be limited to:  Education.  Change or adjustment in medications.  Change or adjustment in meal plan.  Treatment for an illness, infection, etc.  More frequent blood glucose monitoring.  Change in exercise plan.  Decreasing or stopping steroids.  Lifestyle changes. HOME CARE INSTRUCTIONS   Test your blood glucose as directed.  Exercise regularly. Your caregiver will give you instructions about exercise. Pre-diabetes or diabetes which comes on with stress is helped by exercising.  Eat wholesome, balanced meals. Eat often and at regular, fixed times. Your caregiver or nutritionist will give you a meal plan to guide your sugar intake.  Being at an ideal weight is important. If needed, losing as little as 10 to 15 pounds may help improve blood glucose levels. SEEK MEDICAL CARE IF:   You have questions about medicine, activity, or diet.  You continue to have  symptoms (problems such as increased thirst, urination, or weight gain). SEEK IMMEDIATE MEDICAL CARE IF:   You are vomiting or have diarrhea.  Your breath smells fruity.  You are breathing faster or slower.  You are very sleepy or incoherent.  You have numbness, tingling, or pain in your feet or hands.  You have chest pain.  Your symptoms get worse even though you have been following your caregiver's orders.  If you have any other questions or concerns. Document Released: 06/08/2001 Document Revised: 03/06/2012 Document Reviewed: 04/10/2012 [facility] Patient Information 2014 [place], [place].

## 2014-03-29 ENCOUNTER — Encounter (HOSPITAL_COMMUNITY): Payer: Managed Care, Other (non HMO)

## 2014-04-05 ENCOUNTER — Encounter (HOSPITAL_COMMUNITY)
Admission: RE | Admit: 2014-04-05 | Discharge: 2014-04-05 | Disposition: A | Discharge status: Home / Self Care | Payer: Managed Care, Other (non HMO) | Source: Ambulatory Visit | Attending: Nephrology | Admitting: Nephrology

## 2014-04-05 VITALS — BP 157/84 | HR 82 | Resp 20

## 2014-04-05 DIAGNOSIS — N182 Chronic kidney disease, stage 2 (mild): Secondary | ICD-10-CM | POA: Insufficient documentation

## 2014-04-05 LAB — IRON AND TIBC
Iron: 84 ug/dL (ref 42–135)
Saturation Ratios: 32 % (ref 20–55)
TIBC: 262 ug/dL (ref 250–470)
UIBC: 178 ug/dL (ref 125–400)

## 2014-04-05 LAB — POCT HEMOGLOBIN-HEMACUE: Hemoglobin: 8.9 g/dL — ABNORMAL LOW (ref 12.0–15.0)

## 2014-04-05 MED ORDER — EPOETIN ALFA 20000 UNIT/ML IJ SOLN
INTRAMUSCULAR | Status: AC
  Administered 2014-04-05: 20000 [IU] via SUBCUTANEOUS
  Filled 2014-04-05: qty 1

## 2014-04-05 MED ORDER — EPOETIN ALFA 10000 UNIT/ML IJ SOLN
INTRAMUSCULAR | Status: AC
Start: 2014-04-05 — End: 2014-04-05
  Administered 2014-04-05: 10000 [IU] via SUBCUTANEOUS
  Filled 2014-04-05: qty 1

## 2014-04-05 MED ORDER — EPOETIN ALFA 40000 UNIT/ML IJ SOLN: 30000.0000 [IU] | INTRAMUSCULAR | Status: DC

## 2014-04-06 LAB — FERRITIN: FERRITIN: 228 ng/mL (ref 10–291)

## 2014-04-18 ENCOUNTER — Emergency Department (HOSPITAL_COMMUNITY): Payer: Managed Care, Other (non HMO)

## 2014-04-18 ENCOUNTER — Inpatient Hospital Stay (HOSPITAL_COMMUNITY)
Admission: EM | Admit: 2014-04-18 | Discharge: 2014-04-23 | DRG: 683 | Disposition: A | Discharge status: Home / Self Care | Payer: Managed Care, Other (non HMO) | Attending: Internal Medicine | Admitting: Internal Medicine

## 2014-04-18 ENCOUNTER — Encounter (HOSPITAL_COMMUNITY): Payer: Self-pay | Admitting: Emergency Medicine

## 2014-04-18 DIAGNOSIS — E119 Type 2 diabetes mellitus without complications: Secondary | ICD-10-CM

## 2014-04-18 DIAGNOSIS — I5030 Unspecified diastolic (congestive) heart failure: Secondary | ICD-10-CM | POA: Diagnosis present

## 2014-04-18 DIAGNOSIS — K589 Irritable bowel syndrome without diarrhea: Secondary | ICD-10-CM

## 2014-04-18 DIAGNOSIS — N2581 Secondary hyperparathyroidism of renal origin: Secondary | ICD-10-CM

## 2014-04-18 DIAGNOSIS — N184 Chronic kidney disease, stage 4 (severe): Secondary | ICD-10-CM | POA: Diagnosis present

## 2014-04-18 DIAGNOSIS — I129 Hypertensive chronic kidney disease with stage 1 through stage 4 chronic kidney disease, or unspecified chronic kidney disease: Secondary | ICD-10-CM | POA: Diagnosis present

## 2014-04-18 DIAGNOSIS — I509 Heart failure, unspecified: Secondary | ICD-10-CM | POA: Diagnosis present

## 2014-04-18 DIAGNOSIS — E1140 Type 2 diabetes mellitus with diabetic neuropathy, unspecified: Secondary | ICD-10-CM

## 2014-04-18 DIAGNOSIS — I959 Hypotension, unspecified: Secondary | ICD-10-CM | POA: Diagnosis present

## 2014-04-18 DIAGNOSIS — I152 Hypertension secondary to endocrine disorders: Secondary | ICD-10-CM | POA: Diagnosis present

## 2014-04-18 DIAGNOSIS — R42 Dizziness and giddiness: Secondary | ICD-10-CM

## 2014-04-18 DIAGNOSIS — R209 Unspecified disturbances of skin sensation: Secondary | ICD-10-CM | POA: Diagnosis present

## 2014-04-18 DIAGNOSIS — K3184 Gastroparesis: Secondary | ICD-10-CM

## 2014-04-18 DIAGNOSIS — D259 Leiomyoma of uterus, unspecified: Secondary | ICD-10-CM

## 2014-04-18 DIAGNOSIS — N183 Chronic kidney disease, stage 3 unspecified: Secondary | ICD-10-CM

## 2014-04-18 DIAGNOSIS — M79609 Pain in unspecified limb: Secondary | ICD-10-CM | POA: Diagnosis present

## 2014-04-18 DIAGNOSIS — E1149 Type 2 diabetes mellitus with other diabetic neurological complication: Secondary | ICD-10-CM | POA: Diagnosis present

## 2014-04-18 DIAGNOSIS — G609 Hereditary and idiopathic neuropathy, unspecified: Secondary | ICD-10-CM | POA: Diagnosis present

## 2014-04-18 DIAGNOSIS — M79602 Pain in left arm: Secondary | ICD-10-CM

## 2014-04-18 DIAGNOSIS — E1159 Type 2 diabetes mellitus with other circulatory complications: Secondary | ICD-10-CM | POA: Diagnosis present

## 2014-04-18 DIAGNOSIS — Z794 Long term (current) use of insulin: Secondary | ICD-10-CM

## 2014-04-18 DIAGNOSIS — Z6836 Body mass index (BMI) 36.0-36.9, adult: Secondary | ICD-10-CM

## 2014-04-18 DIAGNOSIS — I10 Essential (primary) hypertension: Secondary | ICD-10-CM

## 2014-04-18 DIAGNOSIS — IMO0001 Reserved for inherently not codable concepts without codable children: Secondary | ICD-10-CM

## 2014-04-18 DIAGNOSIS — R202 Paresthesia of skin: Secondary | ICD-10-CM

## 2014-04-18 DIAGNOSIS — N170 Acute kidney failure with tubular necrosis: Principal | ICD-10-CM | POA: Diagnosis present

## 2014-04-18 DIAGNOSIS — R011 Cardiac murmur, unspecified: Secondary | ICD-10-CM

## 2014-04-18 DIAGNOSIS — E1142 Type 2 diabetes mellitus with diabetic polyneuropathy: Secondary | ICD-10-CM | POA: Diagnosis present

## 2014-04-18 DIAGNOSIS — Z833 Family history of diabetes mellitus: Secondary | ICD-10-CM

## 2014-04-18 DIAGNOSIS — D638 Anemia in other chronic diseases classified elsewhere: Secondary | ICD-10-CM

## 2014-04-18 DIAGNOSIS — Z888 Allergy status to other drugs, medicaments and biological substances status: Secondary | ICD-10-CM

## 2014-04-18 DIAGNOSIS — R200 Anesthesia of skin: Secondary | ICD-10-CM

## 2014-04-18 LAB — CBC
HCT: 26.9 % — ABNORMAL LOW (ref 36.0–46.0)
HEMOGLOBIN: 8.5 g/dL — AB (ref 12.0–15.0)
MCH: 29.1 pg (ref 26.0–34.0)
MCHC: 31.6 g/dL (ref 30.0–36.0)
MCV: 92.1 fL (ref 78.0–100.0)
PLATELETS: 247 10*3/uL (ref 150–400)
RBC: 2.92 MIL/uL — AB (ref 3.87–5.11)
RDW: 14.5 % (ref 11.5–15.5)
WBC: 5.4 10*3/uL (ref 4.0–10.5)

## 2014-04-18 LAB — BASIC METABOLIC PANEL
BUN: 34 mg/dL — ABNORMAL HIGH (ref 6–23)
BUN: 32 mg/dL — AB (ref 6–23)
CALCIUM: 8.6 mg/dL (ref 8.4–10.5)
CHLORIDE: 99 meq/L (ref 96–112)
CO2: 21 meq/L (ref 19–32)
CO2: 20 meq/L (ref 19–32)
CREATININE: 3.14 mg/dL — AB (ref 0.50–1.10)
CREATININE: 2.97 mg/dL — AB (ref 0.50–1.10)
Calcium: 8.6 mg/dL (ref 8.4–10.5)
Chloride: 98 mEq/L (ref 96–112)
GFR calc Af Amer: 20 mL/min — ABNORMAL LOW (ref >90)
GFR calc Af Amer: 21 mL/min — ABNORMAL LOW (ref >90)
GFR calc non Af Amer: 17 mL/min — ABNORMAL LOW (ref >90)
GFR calc non Af Amer: 18 mL/min — ABNORMAL LOW (ref >90)
GLUCOSE: 373 mg/dL — AB (ref 70–99)
GLUCOSE: 316 mg/dL — AB (ref 70–99)
Potassium: 4.5 mEq/L (ref 3.7–5.3)
Potassium: 4.4 mEq/L (ref 3.7–5.3)
Sodium: 133 mEq/L — ABNORMAL LOW (ref 137–147)
Sodium: 134 mEq/L — ABNORMAL LOW (ref 137–147)

## 2014-04-18 LAB — I-STAT TROPONIN, ED
Troponin i, poc: 0.02 ng/mL (ref 0.00–0.08)
Troponin i, poc: 0 ng/mL (ref 0.00–0.08)

## 2014-04-18 MED ORDER — HYDROCODONE-ACETAMINOPHEN 5-325 MG PO TABS
1.0000 | ORAL_TABLET | Freq: Once | ORAL | Status: AC
  Administered 2014-04-18: 1 via ORAL
  Filled 2014-04-18: qty 1

## 2014-04-18 MED ORDER — LORAZEPAM 2 MG/ML IJ SOLN
1.0000 mg | Freq: Once | INTRAMUSCULAR | Status: AC
  Administered 2014-04-18: 1 mg via INTRAVENOUS
  Filled 2014-04-18: qty 1

## 2014-04-18 MED ORDER — SODIUM CHLORIDE 0.9 % IV BOLUS (SEPSIS)
1000.0000 mL | Freq: Once | INTRAVENOUS | Status: AC
  Administered 2014-04-18: 1000 mL via INTRAVENOUS

## 2014-04-18 MED ORDER — SODIUM CHLORIDE 0.9 % IV BOLUS (SEPSIS)
500.0000 mL | Freq: Once | INTRAVENOUS | Status: AC
  Administered 2014-04-18: 500 mL via INTRAVENOUS

## 2014-04-18 MED ORDER — MECLIZINE HCL 25 MG PO TABS
25.0000 mg | ORAL_TABLET | Freq: Once | ORAL | Status: AC
  Administered 2014-04-18: 25 mg via ORAL
  Filled 2014-04-18: qty 1

## 2014-04-18 MED ORDER — MORPHINE SULFATE 2 MG/ML IJ SOLN
2.0000 mg | Freq: Once | INTRAMUSCULAR | Status: AC
  Administered 2014-04-18: 2 mg via INTRAVENOUS
  Filled 2014-04-18: qty 1

## 2014-04-18 NOTE — Consult Note (Addendum)
Reason for Consult:Left arm numbness and pain Referring Physician: Horton  CC: Left arm pain and numbness  HPI: Heather [name] is an 43 y.o. female who reports that on awakening yesterday she noted pain and numbness in her left arm.  It starts at her anterior chest and includes the entire arm and into all of the fingers.  She was able to go to work yesterday but awakened today and felt that her pain was worse.  She also noted some numbness on the left side of her face.  [name] also feeling dizzy.  She reports that her lower extremities are not involved but have had issues due to neuropathy for some time.  She was able to work today as well but then decided to present for further evaluation.   The patient reports that she has had diabetes for over two decades.  She has not really been taking her diabetes seriously and reports that until the last 3 or so months her blood sugars were routinely above 500.  She reports that she is more diligent about taking her medications now.  She has had to have two surgeries on her eyes, has a severe peripheral neuropathy and renal damage.    Past Medical History  Diagnosis Date  . Gastroparesis   . Anemia   . IBS (irritable bowel syndrome)   . Nausea   . Hx: UTI (urinary tract infection)   . Fibroids     leiomyoma 8/04 per [name]  . Fibromyalgia   . Diabetes mellitus   . Hypertension   . Chronic kidney disease   . Allergy   . Blood transfusion without reported diagnosis   . Infertility     eval at [facility]    Past Surgical History  Procedure Laterality Date  . Eye surgery      bilateral, vitrectomies  . Fibroid tumors removed    . Foot surgery      bilateral    Family History  Problem Relation Age of Onset  . Colon polyps Mother   . Diabetes Mother   . Anesthesia problems Neg Hx   . Hypotension Neg Hx   . Malignant hyperthermia Neg Hx   . Pseudochol deficiency Neg Hx   . Heart murmur Father   . Hypertension Father   . Diabetes Sister   . Kidney  failure Sister   . Diabetes Brother   . Retinal degeneration Brother   . Heart murmur Son   . Stroke Paternal Grandmother   . Diabetes Sister     Social History:  reports that she has never smoked. She has never used smokeless tobacco. She reports that she does not drink alcohol or use illicit drugs.  Allergies  Allergen Reactions  . Ibuprofen     CKD stage 3. Should avoid.  . Dilaudid [Hydromorphone Hcl] Itching    Medications: I have reviewed the patient's current medications. Prior to Admission:  Current outpatient prescriptions: albuterol (PROVENTIL HFA;VENTOLIN HFA) 108 (90 BASE) MCG/ACT inhaler, Inhale 1-2 puffs into the lungs every 6 (six) hours as needed for wheezing or shortness of breath., Disp: , Rfl: ;   amLODipine (NORVASC) 10 MG tablet, Take 20 mg by mouth at bedtime. , Disp: , Rfl: ;   calcitRIOL (ROCALTROL) 0.25 MCG capsule, Take 0.5 mcg by mouth at bedtime. , Disp: , Rfl:  cyclobenzaprine (FLEXERIL) 10 MG tablet, Take 1 tablet (10 mg total) by mouth 3 (three) times daily as needed for muscle spasms., Disp: 30 tablet, Rfl:  0;   furosemide (LASIX) 80 MG tablet, Take 160 mg by mouth at bedtime. , Disp: , Rfl: ;   lisinopril (PRINIVIL,ZESTRIL) 10 MG tablet, Take 20 mg by mouth every evening. , Disp: , Rfl:  NOVOLIN 70/30 RELION (70-30) 100 UNIT/ML injection, Inject 45 Units into the skin 2 (two) times daily with a meal. , Disp: , Rfl: ;  pregabalin (LYRICA) 150 MG capsule, Take 300 mg by mouth at bedtime. , Disp: , Rfl: ;   ACCU-CHEK SMARTVIEW test strip, , Disp: , Rfl:   ROS: History obtained from the patient  General ROS: negative for - chills, fatigue, fever, night sweats, weight gain or weight loss Psychological ROS: negative for - behavioral disorder, hallucinations, memory difficulties, mood swings or suicidal ideation Ophthalmic ROS: negative for - blurry vision, double vision, eye pain or loss of vision ENT ROS: vertigo Allergy and Immunology ROS: negative for  - hives or itchy/watery eyes Hematological and Lymphatic ROS: negative for - bleeding problems, bruising or swollen lymph nodes Endocrine ROS: negative for - galactorrhea, hair pattern changes, polydipsia/polyuria or temperature intolerance Respiratory ROS: negative for - cough, hemoptysis, shortness of breath or wheezing Cardiovascular ROS: negative for - chest pain, dyspnea on exertion, edema or irregular heartbeat Gastrointestinal ROS: negative for - abdominal pain, diarrhea, hematemesis, nausea/vomiting or stool incontinence Genito-Urinary ROS: negative for - dysuria, hematuria, incontinence or urinary frequency/urgency Musculoskeletal ROS: pain in legs and feet Neurological ROS: as noted in HPI Dermatological ROS: negative for rash and skin lesion changes  Physical Examination: Blood pressure 126/85, pulse 88, temperature 98.2 F (36.8 C), temperature source Oral, resp. rate 18, height 5\' 9"  (1.753 m), weight 113.671 kg (250 lb 9.6 oz), last menstrual period 04/11/2014, SpO2 99.00%.  Neurologic Examination Mental Status: Alert, oriented, thought content appropriate.  Speech fluent without evidence of aphasia.  Able to follow 3 step commands without difficulty. Cranial Nerves: II: Discs flat bilaterally; Visual fields grossly normal, pupils equal, round, reactive to light and accommodation III,IV, VI: ptosis not present, extra-ocular motions intact bilaterally V,VII: smile symmetric, facial light touch sensation normal bilaterally VIII: hearing normal bilaterally IX,X: gag reflex present XI: bilateral shoulder shrug XII: midline tongue extension Motor: Right : Upper extremity   5/5    Left:     Upper extremity   5/5  Lower extremity   5/5     Lower extremity   5/5 Tone and bulk:normal tone throughout; no atrophy noted Sensory: Pinprick and light touch decreased on the left side of her face and left upper extremity Deep Tendon Reflexes: 2+ in the upper extremities, 1+ at the knees  and absent at the ankles.   Plantars: Right: mute   Left: mute Cerebellar: normal finger-to-nose and normal heel-to-shin test CV: pulses palpable throughout     Laboratory Studies:   Basic Metabolic Panel:  Recent Labs Lab 04/18/14 1209  NA 133*  K 4.5  CL 98  CO2 21  GLUCOSE 373*  BUN 34*  CREATININE 3.14*  CALCIUM 8.6    Liver Function Tests: No results found for this basename: AST, ALT, ALKPHOS, BILITOT, PROT, ALBUMIN,  in the last 168 hours No results found for this basename: LIPASE, AMYLASE,  in the last 168 hours No results found for this basename: AMMONIA,  in the last 168 hours  CBC:  Recent Labs Lab 04/18/14 1209  WBC 5.4  HGB 8.5*  HCT 26.9*  MCV 92.1  PLT 247    Cardiac Enzymes: No results found for this basename:  CKTOTAL, CKMB, CKMBINDEX, TROPONINI,  in the last 168 hours  BNP: No components found with this basename: POCBNP,   CBG: No results found for this basename: GLUCAP,  in the last 168 hours  Microbiology: Results for orders placed during the hospital encounter of 09/15/13  URINE CULTURE     Status: None   Collection Time    09/15/13  1:54 AM      Result Value Ref Range Status   Specimen Description URINE, CLEAN CATCH   Final   Special Requests Normal   Final   Culture  Setup Time     Final   Value: 09/15/2013 17:40     Performed at [place]     Final   Value: 35,000 COLONIES/ML     Performed at [facility]   Culture     Final   Value: Multiple bacterial morphotypes present, none predominant. Suggest appropriate recollection if clinically indicated.     Performed at [facility]   Report Status 09/16/2013 FINAL   Final    Coagulation Studies: No results found for this basename: LABPROT, INR,  in the last 72 hours  Urinalysis: No results found for this basename: COLORURINE, APPERANCEUR, LABSPEC, PHURINE, GLUCOSEU, HGBUR, BILIRUBINUR, KETONESUR, PROTEINUR, UROBILINOGEN, NITRITE,  LEUKOCYTESUR,  in the last 168 hours  Lipid Panel:     Component Value Date/Time   TRIG 200 02/13/2013   HDL 42 02/13/2013   LDLCALC 195 02/13/2013    HgbA1C:  Lab Results  Component Value Date   HGBA1C 12.0* 09/04/2013    Urine Drug Screen:   No results found for this basename: labopia, cocainscrnur, labbenz, amphetmu, thcu, labbarb    Alcohol Level: No results found for this basename: ETH,  in the last 168 hours  Other results: EKG: normal sinus rhythm at 94 bpm.  Imaging: Dg Chest 2 View  04/18/2014   CLINICAL DATA:  Left arm pain and numbness  EXAM: CHEST  2 VIEW  COMPARISON:  02/04/2014  FINDINGS: Thoracolumbar scoliosis and spondylosis. Ill-defined density at the intersection of the right second and sixth ribs on the frontal projection, not well corroborated on the lateral projection.  Cardiac and mediastinal margins appear normal. No pleural effusion identified.  IMPRESSION: 1. Nonspecific ill-defined density at the intersection of the right second and sixth ribs -although conceivably a summation shadow, I cannot exclude early pneumonia. This area appeared clear on 04/03/2013 and accordingly is not thought to be a neoplastic pulmonary nodule.   Electronically Signed   By: Sherryl Barters M.D.   On: 04/18/2014 12:45   Ct Head Wo Contrast  04/18/2014   CLINICAL DATA:  Headache, left side numbness  EXAM: CT HEAD WITHOUT CONTRAST  TECHNIQUE: Contiguous axial images were obtained from the base of the skull through the vertex without intravenous contrast.  COMPARISON:  05/29/2011  FINDINGS: No skull fracture is noted. Paranasal sinuses and mastoid air cells are unremarkable. No intracranial hemorrhage, mass effect or midline shift.  No intraventricular hemorrhage. Ventricular size is stable from prior exam.  Subtle mild subcortical white matter decreased attenuation may be due to chronic small vessel ischemic changes. No definite acute cortical infarction. No mass lesion is noted on this  unenhanced scan.  IMPRESSION: No intracranial hemorrhage, mass effect or midline shift.Subtle mild subcortical white matter decreased attenuation may be due to chronic small vessel ischemic changes. No definite acute cortical infarction.   Electronically Signed   By: Orlean Bradford.D.  On: 04/18/2014 15:47   Mr Brain Wo Contrast  04/18/2014   CLINICAL DATA:  43 year old female with upper extremity numbness and pain radiating to the left side of the body. Left side facial numbness. Initial encounter.  EXAM: MRI HEAD WITHOUT CONTRAST  TECHNIQUE: Multiplanar, multiecho pulse sequences of the brain and surrounding structures were obtained without intravenous contrast.  COMPARISON:  Head CTs without contrast 04/18/2014 and earlier.  FINDINGS: No restricted diffusion to suggest acute infarction. No midline shift, mass effect, evidence of mass lesion, ventriculomegaly, extra-axial collection or acute intracranial hemorrhage. Cervicomedullary junction and pituitary are within normal limits. Negative visualized cervical spine. Major intracranial vascular flow voids are preserved.  Scattered small cerebral white matter T2 and FLAIR hyperintense foci. Central and subcortical white matter predominantly affected. The pattern is nonspecific. The extent is moderate for age. No cortical encephalomalacia. No temporal lobe involvement identified. Deep gray matter nuclei, brainstem and cerebellum are within normal limits.  Visible internal auditory structures appear normal. Mastoids are clear. Occasional small mucous retention cysts in the paranasal sinuses. Visualized orbit soft tissues are within normal limits. Visualized bone marrow signal is within normal limits. Visualized scalp soft tissues are within normal limits.  IMPRESSION: 1.  No acute intracranial abnormality. 2. Moderate for age nonspecific cerebral white matter signal changes. Differential considerations include accelerated small vessel ischemia, sequelae of prior  trauma, hypercoagulable state, vasculitis, migraines, prior infection or demyelination.   Electronically Signed   By: Lars Pinks M.D.   On: 04/18/2014 19:14     Assessment/Plan: 43 year old female with multiple complaints including left arm and face numbness, left arm pain and dizziness.  Renal function continues to decline.  Head CT reviewed that shows no acute changes.  MRI of the brain reviewed as well and also shows no acute changes.  There are multiple T2 hyperintensities which are nonspecific.  Differential is broad including being related to her poorly controlled diabetes.   No evidence of acute infarct. 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  MS remains on the differential as well but the patient does not fulfill criteria for that at this time.   Recommendations: 1.  ASA 81mg  daily 2.  Analgesia for pain 3.  Meclizine prn 4.  Blood sugar control 5.  Follow up with neurology on an outpatient basis for further work up and management  Case discussed with Dr. Markus Daft, MD [facility] [phone] 04/18/2014, 9:08 PM

## 2014-04-18 NOTE — ED Notes (Signed)
470 ml observed via bladder scanner

## 2014-04-18 NOTE — ED Notes (Signed)
Left arm numbness started yesterday, into left hand, all fingers on left hand are numb at present, also states left side of face "feels like when you go to the dentist, numb"

## 2014-04-18 NOTE — ED Notes (Addendum)
Pt reports a pain to left side of body that started yesterday. Increased when moving her left arm yesterday, started with numbness to two fingers and now numbness to entire hand. Pain is also radiating down into left hip. States that left side of face also feels numb, no facial droop noted at triage.

## 2014-04-18 NOTE — ED Notes (Signed)
States has increased lasix to 320mg  qd on Monday- has 2+ pitting edema bilateral lower legs. States is pre-dialysis, has not made any urine all day.

## 2014-04-18 NOTE — ED Provider Notes (Signed)
CSN: [id number]     Arrival date & time 04/18/14  1138 History   First MD Initiated Contact with Patient 04/18/14 1412     Chief Complaint  Patient presents with  . Arm Pain  . Numbness     (Consider location/radiation/quality/duration/timing/severity/associated sxs/prior Treatment) The history is provided by the patient and medical records. No language interpreter was used.    Heather [name] is a 43 y.o. female  with a hx of gastroparesis IDDM, HTN, HTN, CKD (not on ESRD), fibromyalgia presents to the Emergency Department complaining of gradual, persistent, progressively worsening L arm pain onset yesterday morning which progressed to paresthesias throughout the day.  Pt reports that when she awoke around 6:30 am she noticed that the Left side of her face felt numb as if she had been to the dentist.  Pt reports this includes her nose and her cheek under her eye.  Pt reports the sensation is constant. She reports the arm feels heavy and is constant as well.  She reports no previous episodes of similar nature. No aggravating or alleviating factors.  Pt reports weakness in the L hand/arm at baseline and she does not believe it is worse.  The arm pain begins in the anterior left chest/ shoulder area and radiates through the arm into all 5 fingertips.  She reports associated lightheadedness.  Pt denies fever, chills, N/V/D, weakness, dizziness, syncope, dysuria, hematuria, SOB, chest pain.    Past Medical History  Diagnosis Date  . Gastroparesis   . Anemia   . IBS (irritable bowel syndrome)   . Nausea   . Hx: UTI (urinary tract infection)   . Fibroids     leiomyoma 8/04 per [name]  . Fibromyalgia   . Diabetes mellitus   . Hypertension   . Chronic kidney disease   . Allergy   . Blood transfusion without reported diagnosis   . Infertility     eval at [facility]   Past Surgical History  Procedure Laterality Date  . Eye surgery      bilateral, vitrectomies  . Fibroid tumors removed    . Foot  surgery      bilateral   Family History  Problem Relation Age of Onset  . Colon polyps Mother   . Diabetes Mother   . Anesthesia problems Neg Hx   . Hypotension Neg Hx   . Malignant hyperthermia Neg Hx   . Pseudochol deficiency Neg Hx   . Heart murmur Father   . Hypertension Father   . Diabetes Sister   . Kidney failure Sister   . Diabetes Brother   . Retinal degeneration Brother   . Heart murmur Son   . Stroke Paternal Grandmother   . Diabetes Sister    History  Substance Use Topics  . Smoking status: Never Smoker   . Smokeless tobacco: Never Used  . Alcohol Use: No   OB History   Grav Para Term Preterm Abortions TAB SAB Ect Mult Living   2 1   1  1   1      Review of Systems  Constitutional: Negative for fever, diaphoresis, appetite change, fatigue and unexpected weight change.  HENT: Negative for mouth sores.   Eyes: Negative for visual disturbance.  Respiratory: Negative for cough, chest tightness, shortness of breath and wheezing.   Cardiovascular: Negative for chest pain.  Gastrointestinal: Negative for nausea, vomiting, abdominal pain, diarrhea and constipation.  Endocrine: Negative for polydipsia, polyphagia and polyuria.  Genitourinary: Negative for dysuria, urgency, frequency  and hematuria.  Musculoskeletal: Negative for back pain and neck stiffness.  Skin: Negative for rash.  Allergic/Immunologic: Negative for immunocompromised state.  Neurological: Positive for weakness, light-headedness and numbness. Negative for syncope and headaches.  Hematological: Does not bruise/bleed easily.  Psychiatric/Behavioral: Negative for sleep disturbance. The patient is not nervous/anxious.       Allergies  Ibuprofen and Dilaudid  Home Medications   Prior to Admission medications   Medication Sig Start Date End Date Taking? Authorizing Provider  albuterol (PROVENTIL HFA;VENTOLIN HFA) 108 (90 BASE) MCG/ACT inhaler Inhale 1-2 puffs into the lungs every 6 (six) hours  as needed for wheezing or shortness of breath.   Yes Historical Provider, MD  amLODipine (NORVASC) 10 MG tablet Take 20 mg by mouth at bedtime.  09/10/13  Yes Historical Provider, MD  calcitRIOL (ROCALTROL) 0.25 MCG capsule Take 0.5 mcg by mouth at bedtime.    Yes Historical Provider, MD  cyclobenzaprine (FLEXERIL) 10 MG tablet Take 1 tablet (10 mg total) by mouth 3 (three) times daily as needed for muscle spasms. 03/18/14  Yes Charles B. Karle Starch, MD  furosemide (LASIX) 80 MG tablet Take 160 mg by mouth at bedtime.  08/29/13  Yes Historical Provider, MD  lisinopril (PRINIVIL,ZESTRIL) 10 MG tablet Take 20 mg by mouth every evening.    Yes Historical Provider, MD  NOVOLIN 70/30 RELION (70-30) 100 UNIT/ML injection Inject 45 Units into the skin 2 (two) times daily with a meal.  04/06/13  Yes Historical Provider, MD  pregabalin (LYRICA) 150 MG capsule Take 300 mg by mouth at bedtime.    Yes Historical Provider, MD  ACCU-CHEK SMARTVIEW test strip  09/07/13   Historical Provider, MD   BP 127/80  Pulse 90  Temp(Src) 97.7 F (36.5 C) (Oral)  Resp 18  Ht 5\' 9"  (1.753 m)  Wt 250 lb 9.6 oz (113.671 kg)  BMI 36.99 kg/m2  SpO2 100%  LMP 04/11/2014 Physical Exam  Nursing note and vitals reviewed. Constitutional: [name] oriented to person, place, and time. She appears well-developed and well-nourished. No distress.  HENT:  Head: Normocephalic and atraumatic.  Right Ear: Tympanic membrane, external ear and ear canal normal.  Left Ear: Tympanic membrane, external ear and ear canal normal.  Nose: Nose normal. No epistaxis. Right sinus exhibits no maxillary sinus tenderness and no frontal sinus tenderness. Left sinus exhibits no maxillary sinus tenderness and no frontal sinus tenderness.  Mouth/Throat: Uvula is midline, oropharynx is clear and moist and mucous membranes are normal. Mucous membranes are not pale and not cyanotic. No oropharyngeal exudate, posterior oropharyngeal edema, posterior oropharyngeal  erythema or tonsillar abscesses.  Eyes: Conjunctivae and EOM are normal. Pupils are equal, round, and reactive to light. No scleral icterus.  Visual acuity (near and corrected): R - 20/40 L - 20/40 B - 20/40  Right sided slow leak extinguishing horizontal nystagmus without vertical or rotational nystagmus  Neck: Normal range of motion and full passive range of motion without pain. Neck supple.  Full active and passive ROM without pain  Cardiovascular: Normal rate, regular rhythm, normal heart sounds and intact distal pulses.   RRR  Pulmonary/Chest: Effort normal and breath sounds normal. No stridor. No respiratory distress. She has no wheezes. She has no rales.  Abdominal: Soft. Bowel sounds are normal. There is no tenderness. There is no rebound and no guarding.  Obese Soft and nontender  Musculoskeletal: Normal range of motion.  Lymphadenopathy:    She has no cervical adenopathy.  Neurological: [name] alert and  oriented to person, place, and time. She has normal reflexes. No cranial nerve deficit. She exhibits normal muscle tone. Coordination normal.  Mental Status:  Alert, oriented, thought content appropriate. Speech fluent without evidence of aphasia. Able to follow 2 step commands without difficulty.  Cranial Nerves:  II:  Peripheral visual fields grossly normal, pupils equal, round, reactive to light III,IV, VI: ptosis not present, extra-ocular motions intact bilaterally  V,VII: smile symmetric, facial light touch sensation decreased sensation on the left below the forehead VIII: hearing grossly normal bilaterally  IX,X: gag reflex present  XI: bilateral shoulder shrug equal and strong XII: midline tongue extension  Motor:  5/5 in upper and lower extremities bilaterally including strong and equal grip strength and dorsiflexion/plantar flexion Sensory: Pinprick and light touch decreased in the left, left arm and left leg.  Deep Tendon Reflexes: 2+ and symmetric in the upper  extremities; 1+ on right [name], 2+ left [name], 2+ achilles bilaterally Cerebellar: hesitant finger-to-nose with bilateral upper extremities, but no past pointing Gait: mild truncal ataxia with gait CV: distal pulses palpable throughout   Skin: Skin is warm and dry. No rash noted. [name] not diaphoretic. No erythema.  Nonhealing ulcer, left dorsum of the foot  Psychiatric: She has a normal mood and affect. Her behavior is normal. Judgment and thought content normal.    ED Course  Procedures (including critical care time) Labs Review Labs Reviewed  CBC - Abnormal; Notable for the following:    RBC 2.92 (*)    Hemoglobin 8.5 (*)    HCT 26.9 (*)    All other components within normal limits  BASIC METABOLIC PANEL - Abnormal; Notable for the following:    Sodium 133 (*)    Glucose, Bld 373 (*)    BUN 34 (*)    Creatinine, Ser 3.14 (*)    GFR calc non Af Amer 17 (*)    GFR calc Af Amer 20 (*)    All other components within normal limits  BASIC METABOLIC PANEL - Abnormal; Notable for the following:    Sodium 134 (*)    Glucose, Bld 316 (*)    BUN 32 (*)    Creatinine, Ser 2.97 (*)    GFR calc non Af Amer 18 (*)    GFR calc Af Amer 21 (*)    All other components within normal limits  COMPREHENSIVE METABOLIC PANEL  MAGNESIUM  PHOSPHORUS  CBC WITH DIFFERENTIAL  APTT  PROTIME-INR  TSH  HEMOGLOBIN 123XX123  BASIC METABOLIC PANEL  CBC  I-STAT TROPOININ, ED  I-STAT TROPOININ, ED    Imaging Review Dg Chest 2 View  04/18/2014   CLINICAL DATA:  Left arm pain and numbness  EXAM: CHEST  2 VIEW  COMPARISON:  02/04/2014  FINDINGS: Thoracolumbar scoliosis and spondylosis. Ill-defined density at the intersection of the right second and sixth ribs on the frontal projection, not well corroborated on the lateral projection.  Cardiac and mediastinal margins appear normal. No pleural effusion identified.  IMPRESSION: 1. Nonspecific ill-defined density at the intersection of the right second and  sixth ribs -although conceivably a summation shadow, I cannot exclude early pneumonia. This area appeared clear on 04/03/2013 and accordingly is not thought to be a neoplastic pulmonary nodule.   Electronically Signed   By: Sherryl Barters M.D.   On: 04/18/2014 12:45   Ct Head Wo Contrast  04/18/2014   CLINICAL DATA:  Headache, left side numbness  EXAM: CT HEAD WITHOUT CONTRAST  TECHNIQUE: Contiguous axial images  were obtained from the base of the skull through the vertex without intravenous contrast.  COMPARISON:  05/29/2011  FINDINGS: No skull fracture is noted. Paranasal sinuses and mastoid air cells are unremarkable. No intracranial hemorrhage, mass effect or midline shift.  No intraventricular hemorrhage. Ventricular size is stable from prior exam.  Subtle mild subcortical white matter decreased attenuation may be due to chronic small vessel ischemic changes. No definite acute cortical infarction. No mass lesion is noted on this unenhanced scan.  IMPRESSION: No intracranial hemorrhage, mass effect or midline shift.Subtle mild subcortical white matter decreased attenuation may be due to chronic small vessel ischemic changes. No definite acute cortical infarction.   Electronically Signed   By: Lahoma Crocker M.D.   On: 04/18/2014 15:47   Mr Brain Wo Contrast  04/18/2014   CLINICAL DATA:  43 year old female with upper extremity numbness and pain radiating to the left side of the body. Left side facial numbness. Initial encounter.  EXAM: MRI HEAD WITHOUT CONTRAST  TECHNIQUE: Multiplanar, multiecho pulse sequences of the brain and surrounding structures were obtained without intravenous contrast.  COMPARISON:  Head CTs without contrast 04/18/2014 and earlier.  FINDINGS: No restricted diffusion to suggest acute infarction. No midline shift, mass effect, evidence of mass lesion, ventriculomegaly, extra-axial collection or acute intracranial hemorrhage. Cervicomedullary junction and pituitary are within normal  limits. Negative visualized cervical spine. Major intracranial vascular flow voids are preserved.  Scattered small cerebral white matter T2 and FLAIR hyperintense foci. Central and subcortical white matter predominantly affected. The pattern is nonspecific. The extent is moderate for age. No cortical encephalomalacia. No temporal lobe involvement identified. Deep gray matter nuclei, brainstem and cerebellum are within normal limits.  Visible internal auditory structures appear normal. Mastoids are clear. Occasional small mucous retention cysts in the paranasal sinuses. Visualized orbit soft tissues are within normal limits. Visualized bone marrow signal is within normal limits. Visualized scalp soft tissues are within normal limits.  IMPRESSION: 1.  No acute intracranial abnormality. 2. Moderate for age nonspecific cerebral white matter signal changes. Differential considerations include accelerated small vessel ischemia, sequelae of prior trauma, hypercoagulable state, vasculitis, migraines, prior infection or demyelination.   Electronically Signed   By: Lars Pinks M.D.   On: 04/18/2014 19:14     EKG Interpretation   Date/Time:  Thursday April 18 2014 11:55:04 EDT Ventricular Rate:  94 PR Interval:  132 QRS Duration: 84 QT Interval:  356 QTC Calculation: 445 R Axis:   79 Text Interpretation:  Normal sinus rhythm Normal ECG Confirmed by HORTON   MD, Loma Sousa ([id number]) on 04/18/2014 11:51:21 PM      MDM   Final diagnoses:  Lightheadedness  Left facial numbness  Numbness and tingling in left arm  DIABETES MELLITUS  Chronic kidney disease, stage III (moderate)   Heather [name] presents with complaints of lightheadedness and dizziness with left facial numbness and left arm pain for greater than 12 hours. On exam patient with mild truncal ataxia and right-sided horizontal nystagmus.  Patient Romberg is unsteady without listing to one specific direction.    Patient with worsening renal function  today and increased BUN and creatinine from baseline. Anion gap 13. Patient take her glycemic at 373 however not far from baseline. Troponin negative.  ECG nonischemic. Head CT without intracranial hemorrhage, mass effect or midline shift however there is subtle mild subcortical white matter disease.  I personally reviewed the imaging tests through PACS system.  I reviewed available ER/hospitalization records through the EMR.  Given patient's persistent neurologic symptoms will obtain MRI.  7:56 PM MRI with Moderate for age nonspecific cerebral white matter signal changes. Differential considerations include accelerated small vessel ischemia, sequelae of prior trauma, hypercoagulable state, vasculitis, migraines, prior infection or demyelination.  I personally reviewed the imaging tests through PACS system  I reviewed available ER/hospitalization records through the EMR  Patient with continued lightheadedness and dizziness. She remains unstable on her feet.  Due to objective neurologic findings and nonspecific MRI will consult neurology.   8:21 PM Discussed with Dr. Doy Mince of neurology who will evaluate.    9:13PM Discussed with Dr. Doy Mince.  She believes the noted ataxia on exam is likely secondary to patient's peripheral neuropathy. She recommends outpatient neurology followup. Patient has been given fluids. Will attempt to have her urinate and recheck her BMP.  11:35 PM Pt without significantly improved renal function.  She has had 2 L of fluid and she has not urinated in 24 hours.  She feels as if she does not need to urinate.  I am concerned she is not making urine. She reports taking lasix 300mg  today as directed by her MD.  Bladder scan with 470cc.  Will plan for admission at this time.   11:44 PM Discussed with Dr. Charlies Silvers who will admit for acute on chronic renal failure.    BP 127/80  Pulse 90  Temp(Src) 97.7 F (36.5 C) (Oral)  Resp 18  Ht 5\' 9"  (1.753 m)  Wt 250 lb 9.6  oz (113.671 kg)  BMI 36.99 kg/m2  SpO2 100%  LMP 04/11/2014   Abigail Butts, PA-C 04/19/14 0131

## 2014-04-19 ENCOUNTER — Inpatient Hospital Stay (HOSPITAL_COMMUNITY): Payer: Managed Care, Other (non HMO)

## 2014-04-19 ENCOUNTER — Inpatient Hospital Stay (HOSPITAL_COMMUNITY): Admission: RE | Admit: 2014-04-19 | Payer: Managed Care, Other (non HMO) | Source: Ambulatory Visit

## 2014-04-19 DIAGNOSIS — I10 Essential (primary) hypertension: Secondary | ICD-10-CM

## 2014-04-19 DIAGNOSIS — N183 Chronic kidney disease, stage 3 unspecified: Secondary | ICD-10-CM

## 2014-04-19 DIAGNOSIS — E119 Type 2 diabetes mellitus without complications: Secondary | ICD-10-CM

## 2014-04-19 LAB — DIFFERENTIAL
BASOS ABS: 0 10*3/uL (ref 0.0–0.1)
BASOS PCT: 0 % (ref 0–1)
Eosinophils Absolute: 0.1 10*3/uL (ref 0.0–0.7)
Eosinophils Relative: 2 % (ref 0–5)
Lymphocytes Relative: 37 % (ref 12–46)
Lymphs Abs: 1.9 10*3/uL (ref 0.7–4.0)
Monocytes Absolute: 0.4 10*3/uL (ref 0.1–1.0)
Monocytes Relative: 8 % (ref 3–12)
NEUTROS ABS: 2.8 10*3/uL (ref 1.7–7.7)
NEUTROS PCT: 53 % (ref 43–77)

## 2014-04-19 LAB — COMPREHENSIVE METABOLIC PANEL
ALBUMIN: 2.6 g/dL — AB (ref 3.5–5.2)
ALT: 24 U/L (ref 0–35)
AST: 15 U/L (ref 0–37)
Alkaline Phosphatase: 76 U/L (ref 39–117)
BUN: 35 mg/dL — ABNORMAL HIGH (ref 6–23)
CALCIUM: 8 mg/dL — AB (ref 8.4–10.5)
CO2: 21 mEq/L (ref 19–32)
CREATININE: 3.19 mg/dL — AB (ref 0.50–1.10)
Chloride: 100 mEq/L (ref 96–112)
GFR calc Af Amer: 19 mL/min — ABNORMAL LOW (ref >90)
GFR calc non Af Amer: 17 mL/min — ABNORMAL LOW (ref >90)
Glucose, Bld: 362 mg/dL — ABNORMAL HIGH (ref 70–99)
Potassium: 4.3 mEq/L (ref 3.7–5.3)
Sodium: 135 mEq/L — ABNORMAL LOW (ref 137–147)
Total Bilirubin: <0.2 mg/dL — ABNORMAL LOW (ref 0.3–1.2)
Total Protein: 6.6 g/dL (ref 6.0–8.3)

## 2014-04-19 LAB — CBC
HCT: 25.8 % — ABNORMAL LOW (ref 36.0–46.0)
Hemoglobin: 8 g/dL — ABNORMAL LOW (ref 12.0–15.0)
MCH: 28.8 pg (ref 26.0–34.0)
MCHC: 31 g/dL (ref 30.0–36.0)
MCV: 92.8 fL (ref 78.0–100.0)
PLATELETS: 222 10*3/uL (ref 150–400)
RBC: 2.78 MIL/uL — ABNORMAL LOW (ref 3.87–5.11)
RDW: 14.4 % (ref 11.5–15.5)
WBC: 5.1 10*3/uL (ref 4.0–10.5)

## 2014-04-19 LAB — APTT: APTT: 29 s (ref 24–37)

## 2014-04-19 LAB — LACTATE DEHYDROGENASE: LDH: 212 U/L (ref 94–250)

## 2014-04-19 LAB — BASIC METABOLIC PANEL
BUN: 35 mg/dL — AB (ref 6–23)
CALCIUM: 8.2 mg/dL — AB (ref 8.4–10.5)
CO2: 22 mEq/L (ref 19–32)
CREATININE: 3.31 mg/dL — AB (ref 0.50–1.10)
Chloride: 100 mEq/L (ref 96–112)
GFR, EST AFRICAN AMERICAN: 18 mL/min — AB (ref >90)
GFR, EST NON AFRICAN AMERICAN: 16 mL/min — AB (ref >90)
GLUCOSE: 363 mg/dL — AB (ref 70–99)
Potassium: 4.2 mEq/L (ref 3.7–5.3)
Sodium: 135 mEq/L — ABNORMAL LOW (ref 137–147)

## 2014-04-19 LAB — GLUCOSE, CAPILLARY
Glucose-Capillary: 342 mg/dL — ABNORMAL HIGH (ref 70–99)
Glucose-Capillary: 70 mg/dL (ref 70–99)
Glucose-Capillary: 208 mg/dL — ABNORMAL HIGH (ref 70–99)
Glucose-Capillary: 158 mg/dL — ABNORMAL HIGH (ref 70–99)

## 2014-04-19 LAB — HEMOGLOBIN A1C
HEMOGLOBIN A1C: 8.6 % — AB (ref <5.7)
Mean Plasma Glucose: 200 mg/dL — ABNORMAL HIGH (ref <117)

## 2014-04-19 LAB — MAGNESIUM: Magnesium: 1.6 mg/dL (ref 1.5–2.5)

## 2014-04-19 LAB — PROTIME-INR
INR: 1 (ref 0.00–1.49)
PROTHROMBIN TIME: 13 s (ref 11.6–15.2)

## 2014-04-19 LAB — IRON AND TIBC
Iron: 34 ug/dL — ABNORMAL LOW (ref 42–135)
Saturation Ratios: 12 % — ABNORMAL LOW (ref 20–55)
TIBC: 283 ug/dL (ref 250–470)
UIBC: 249 ug/dL (ref 125–400)

## 2014-04-19 LAB — PHOSPHORUS: PHOSPHORUS: 4.9 mg/dL — AB (ref 2.3–4.6)

## 2014-04-19 LAB — FERRITIN: FERRITIN: 125 ng/mL (ref 10–291)

## 2014-04-19 LAB — TSH: TSH: 2.26 u[IU]/mL (ref 0.350–4.500)

## 2014-04-19 MED ORDER — AMLODIPINE BESYLATE 10 MG PO TABS
20.0000 mg | ORAL_TABLET | Freq: Every day | ORAL | Status: DC
  Administered 2014-04-19 – 2014-04-22 (×5): 20 mg via ORAL
  Filled 2014-04-19 (×6): qty 2

## 2014-04-19 MED ORDER — ACETAMINOPHEN 650 MG RE SUPP: 650.0000 mg | Freq: Four times a day (QID) | RECTAL | Status: DC | PRN

## 2014-04-19 MED ORDER — MORPHINE SULFATE 2 MG/ML IJ SOLN
1.0000 mg | INTRAMUSCULAR | Status: DC | PRN
  Administered 2014-04-22: 1 mg via INTRAVENOUS
  Filled 2014-04-19: qty 1

## 2014-04-19 MED ORDER — INSULIN ASPART PROT & ASPART (70-30 MIX) 100 UNIT/ML ~~LOC~~ SUSP
45.0000 [IU] | Freq: Two times a day (BID) | SUBCUTANEOUS | Status: DC
  Administered 2014-04-19: 45 [IU] via SUBCUTANEOUS
  Filled 2014-04-19: qty 10

## 2014-04-19 MED ORDER — INSULIN ASPART 100 UNIT/ML ~~LOC~~ SOLN
0.0000 [IU] | Freq: Every day | SUBCUTANEOUS | Status: DC
  Administered 2014-04-22: 3 [IU] via SUBCUTANEOUS

## 2014-04-19 MED ORDER — CYCLOBENZAPRINE HCL 10 MG PO TABS: 10.0000 mg | ORAL_TABLET | Freq: Three times a day (TID) | ORAL | Status: DC | PRN

## 2014-04-19 MED ORDER — SODIUM CHLORIDE 0.9 % IV SOLN
INTRAVENOUS | Status: AC
  Administered 2014-04-19: 11:00:00 via INTRAVENOUS

## 2014-04-19 MED ORDER — PREGABALIN 75 MG PO CAPS
300.0000 mg | ORAL_CAPSULE | Freq: Every day | ORAL | Status: DC
  Administered 2014-04-19 – 2014-04-22 (×5): 300 mg via ORAL
  Filled 2014-04-19 (×5): qty 4

## 2014-04-19 MED ORDER — ALBUTEROL SULFATE (2.5 MG/3ML) 0.083% IN NEBU: 3.0000 mL | INHALATION_SOLUTION | Freq: Four times a day (QID) | RESPIRATORY_TRACT | Status: DC | PRN

## 2014-04-19 MED ORDER — INSULIN GLARGINE 100 UNIT/ML ~~LOC~~ SOLN
30.0000 [IU] | Freq: Every day | SUBCUTANEOUS | Status: DC
  Administered 2014-04-19 – 2014-04-22 (×4): 30 [IU] via SUBCUTANEOUS
  Filled 2014-04-19 (×5): qty 0.3

## 2014-04-19 MED ORDER — INSULIN GLARGINE 100 UNIT/ML ~~LOC~~ SOLN
30.0000 [IU] | Freq: Every day | SUBCUTANEOUS | Status: DC
  Filled 2014-04-19: qty 0.3

## 2014-04-19 MED ORDER — DARBEPOETIN ALFA-POLYSORBATE 100 MCG/0.5ML IJ SOLN
100.0000 ug | INTRAMUSCULAR | Status: DC
  Administered 2014-04-19: 100 ug via SUBCUTANEOUS
  Filled 2014-04-19: qty 0.5

## 2014-04-19 MED ORDER — ONDANSETRON HCL 4 MG/2ML IJ SOLN: 4.0000 mg | Freq: Four times a day (QID) | INTRAMUSCULAR | Status: DC | PRN

## 2014-04-19 MED ORDER — INSULIN ASPART 100 UNIT/ML ~~LOC~~ SOLN
0.0000 [IU] | Freq: Three times a day (TID) | SUBCUTANEOUS | Status: DC
  Administered 2014-04-19: 11 [IU] via SUBCUTANEOUS
  Administered 2014-04-19 – 2014-04-20 (×3): 5 [IU] via SUBCUTANEOUS
  Administered 2014-04-20 – 2014-04-21 (×2): 3 [IU] via SUBCUTANEOUS
  Administered 2014-04-21: 8 [IU] via SUBCUTANEOUS
  Administered 2014-04-21: 5 [IU] via SUBCUTANEOUS
  Administered 2014-04-22: 8 [IU] via SUBCUTANEOUS
  Administered 2014-04-22: 2 [IU] via SUBCUTANEOUS
  Administered 2014-04-22 – 2014-04-23 (×3): 5 [IU] via SUBCUTANEOUS

## 2014-04-19 MED ORDER — ONDANSETRON HCL 4 MG PO TABS: 4.0000 mg | ORAL_TABLET | Freq: Four times a day (QID) | ORAL | Status: DC | PRN

## 2014-04-19 MED ORDER — ACETAMINOPHEN 325 MG PO TABS
650.0000 mg | ORAL_TABLET | Freq: Four times a day (QID) | ORAL | Status: DC | PRN
  Administered 2014-04-19 – 2014-04-21 (×2): 650 mg via ORAL
  Filled 2014-04-19 (×2): qty 2

## 2014-04-19 MED ORDER — SODIUM CHLORIDE 0.9 % IJ SOLN
3.0000 mL | Freq: Two times a day (BID) | INTRAMUSCULAR | Status: DC
  Administered 2014-04-19 – 2014-04-23 (×10): 3 mL via INTRAVENOUS

## 2014-04-19 MED ORDER — CALCITRIOL 0.5 MCG PO CAPS
0.5000 ug | ORAL_CAPSULE | Freq: Every day | ORAL | Status: DC
  Administered 2014-04-19 – 2014-04-22 (×5): 0.5 ug via ORAL
  Filled 2014-04-19 (×7): qty 1

## 2014-04-19 NOTE — H&P (Addendum)
[facility] History and Physical  Heather [name] [id number] DOB: 12/01/1971 DOA: 04/18/2014  Referring physician: ER physician PCP: Irene Pap, NP   Chief Complaint: numbness in arm  HPI:  43 year old female with past medical history of hypertension, uncontrolled diabetes, diabetic neuropathy, CKD who presented to [facility] ED 04/18/2014 with complaints of worsening left arm numbness and pain for past 24 hours prior to this admission. Patient reported pain has woken her from sleep and then she felt left facial numbness. Patient also felt slightly lightheaded but there was no falls. No slurred speach and no weakness or numbness in legs. No reports of fever or chills. No shortness of breath or palpitations. No abdominal pain, nausea or vomiting. No blood in stool or urine. In ED, vitals are stable, BP was 122/75, HR 84, Tmax 98.2 F and oxygen saturation was 97% on room air. Blood work revealed hemoglobin of 8.5, creatinine of 3.14 (baseline 2.34 in [id number]). CT head and MRI brain showed chronic ischemic changes which per neurology was most likely related to poorly controlled diabetes. Her chest xray showed nonspecific ill-defined density at the intersection of the right second and sixth ribs possibly pneumonia but since pt did not have fevers, cough or elevated WBC count we did not start antibiotics on admission. She was given 1 L NS in ED but has not voided much. Her bladder scan revealed 470 cc of retained urine. Order was placed for foley insertion. She was admitted for further evaluation and management.  Assessment and Plan:  Principal Problem:   Left arm pain, numbness - CT head and MRI brain showed no acute intracranial findings but she does have chronic small vessel ischemic changes with somewhat of a broad differential. She was seen by neurology and per their thought this is likely related to uncontrolled diabetes. A1c in 08/2013 was 9.9. 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 Bladder scan revealed 470 cc of urine. Insert foley. She was on high dose lasix (dose increased recently to 320 mg) - would hold of on further fluids to prevent possible overload; she does not have significant edema in legs but pt said it was much worse - I called renal consult and they will see th pt in am; order placed for renal US   Diabetic neuropathy - continue lyrica    Radiological Exams on Admission: Dg Chest 2 View 04/18/2014   IMPRESSION: 1. Nonspecific ill-defined density at the intersection of the right second and sixth ribs -although conceivably a summation shadow, I cannot exclude early pneumonia. This area appeared clear on 04/03/2013 and accordingly is not thought to be a neoplastic pulmonary nodule.     Ct Head Wo Contrast 04/18/2014    IMPRESSION: No intracranial hemorrhage, mass effect or midline shift.Subtle mild subcortical white matter decreased attenuation may be due to chronic small vessel ischemic changes. No definite acute cortical infarction.  Mr Brain Wo Contrast 04/18/2014   IMPRESSION: 1.  No acute intracranial abnormality. 2. Moderate for age nonspecific cerebral white matter signal changes. Differential considerations include accelerated small vessel ischemia, sequelae of prior trauma, hypercoagulable state, vasculitis, migraines, prior infection or demyelination.      Code Status: Full Family Communication: Pt at  bedside Disposition Plan: Admit for further evaluation  Robbie Lis, MD  [facility] Pager [phone]  Review of Systems:  Constitutional: Negative for fever, chills and malaise/fatigue. Negative for diaphoresis.  HENT: Negative for hearing loss, ear pain, nosebleeds, congestion, sore throat, neck pain, tinnitus and ear discharge.   Eyes: Negative for blurred vision, double vision, photophobia, pain, discharge and redness.  Respiratory: Negative for cough, hemoptysis, sputum production, shortness of breath, wheezing and stridor.   Cardiovascular: Negative for chest pain, palpitations, orthopnea, claudication and leg swelling.  Gastrointestinal: Negative for nausea, vomiting and abdominal pain. Negative for heartburn, constipation, blood in stool and melena.  Genitourinary: Negative for dysuria, urgency, frequency, hematuria and flank pain.  Musculoskeletal: Negative for myalgias, back pain, joint pain and falls.  Skin: Negative for itching and rash.  Neurological: per HPI Endo/Heme/Allergies: Negative for environmental allergies and polydipsia. Does not bruise/bleed easily.  Psychiatric/Behavioral: Negative for suicidal ideas. The patient is not nervous/anxious.      Past Medical History  Diagnosis Date  . Gastroparesis   . Anemia   . IBS (irritable bowel syndrome)   . Nausea   . Hx: UTI (urinary tract infection)   . Fibroids     leiomyoma 8/04 per [name]  . Fibromyalgia   . Diabetes mellitus   . Hypertension   . Chronic kidney disease   . Allergy   . Blood transfusion without reported diagnosis   . Infertility     eval at [facility]   Past Surgical History  Procedure Laterality Date  . Eye surgery      bilateral, vitrectomies  . Fibroid tumors removed    . Foot surgery      bilateral   Social History:  reports that she has never smoked. She has never used smokeless tobacco. She reports that she does not drink alcohol or use illicit drugs.  Allergies  Allergen Reactions   . Ibuprofen     CKD stage 3. Should avoid.  . Dilaudid [Hydromorphone Hcl] Itching    Family History  Problem Relation Age of Onset  . Colon polyps Mother   . Diabetes Mother   . Anesthesia problems Neg Hx   . Hypotension Neg Hx   . Malignant hyperthermia Neg Hx   . Pseudochol deficiency Neg Hx   . Heart murmur Father   . Hypertension Father   . Diabetes Sister   . Kidney failure Sister   . Diabetes Brother   . Retinal degeneration Brother   . Heart murmur Son   . Stroke Paternal Grandmother   . Diabetes Sister      Prior to Admission medications   Medication Sig Start Date End Date Taking? Authorizing Provider  albuterol (PROVENTIL HFA;VENTOLIN HFA) 108 (90 BASE) MCG/ACT inhaler Inhale 1-2 puffs into the lungs every 6 (six) hours as needed for wheezing or shortness of breath.   Yes Historical Provider, MD  amLODipine (NORVASC) 10 MG tablet Take 20 mg by mouth at bedtime.  09/10/13  Yes Historical Provider, MD  calcitRIOL (ROCALTROL) 0.25 MCG capsule Take 0.5 mcg by mouth at bedtime.    Yes Historical Provider, MD  cyclobenzaprine (FLEXERIL) 10 MG tablet Take 1  tablet (10 mg total) by mouth 3 (three) times daily as needed for muscle spasms. 03/18/14  Yes Charles B. Karle Starch, MD  furosemide (LASIX) 80 MG tablet Take 160 mg by mouth at bedtime.  08/29/13  Yes Historical Provider, MD  lisinopril (PRINIVIL,ZESTRIL) 10 MG tablet Take 20 mg by mouth every evening.    Yes Historical Provider, MD  NOVOLIN 70/30 RELION (70-30) 100 UNIT/ML injection Inject 45 Units into the skin 2 (two) times daily with a meal.  04/06/13  Yes Historical Provider, MD  pregabalin (LYRICA) 150 MG capsule Take 300 mg by mouth at bedtime.    Yes Historical Provider, MD  ACCU-CHEK SMARTVIEW test strip  09/07/13   Historical Provider, MD   Physical Exam: Filed Vitals:   04/18/14 1800 04/18/14 1920 04/18/14 2138 04/18/14 2325  BP:  126/85 136/68 137/75  Pulse:  88 90 87  Temp: 98.2 F (36.8 C)     TempSrc:       Resp:    18  Height:      Weight:      SpO2:  99% 100% 97%    Physical Exam  Constitutional: Appears well-developed and well-nourished. No distress.  HENT: Normocephalic. External right and left ear normal.  Eyes: Conjunctivae and EOM are normal. PERRLA, no scleral icterus.  Neck: Normal ROM. Neck supple. No JVD. No tracheal deviation.   CVS: RRR, S1/S2 appreciated, SEM appreciated  Pulmonary: Effort and breath sounds normal, no stridor, rhonchi, wheezes, rales.  Abdominal: Soft. BS +,  no distension, tenderness, rebound or guarding.  Musculoskeletal: Normal range of motion. +1 LE pitting edema  Lymphadenopathy: No lymphadenopathy noted, cervical, inguinal. Neuro: Alert. No focal neurologic deficits Skin: Skin is warm and dry.  Psychiatric: Normal mood and affect.   Labs on Admission:  Basic Metabolic Panel:  Recent Labs Lab 04/18/14 1209 04/18/14 2229  NA 133* 134*  K 4.5 4.4  CL 98 99  CO2 21 20  GLUCOSE 373* 316*  BUN 34* 32*  CREATININE 3.14* 2.97*  CALCIUM 8.6 8.6   Liver Function Tests: No results found for this basename: AST, ALT, ALKPHOS, BILITOT, PROT, ALBUMIN,  in the last 168 hours No results found for this basename: LIPASE, AMYLASE,  in the last 168 hours No results found for this basename: AMMONIA,  in the last 168 hours CBC:  Recent Labs Lab 04/18/14 1209  WBC 5.4  HGB 8.5*  HCT 26.9*  MCV 92.1  PLT 247   Cardiac Enzymes: No results found for this basename: CKTOTAL, CKMB, CKMBINDEX, TROPONINI,  in the last 168 hours BNP: No components found with this basename: POCBNP,  CBG: No results found for this basename: GLUCAP,  in the last 168 hours  If 7PM-7AM, please contact night-coverage [email or web address] Password [email or web address] 04/19/2014, 12:00 AM

## 2014-04-19 NOTE — Progress Notes (Signed)
Inpatient Diabetes Program Recommendations  AACE/ADA: New Consensus Statement on Inpatient Glycemic Control (2013)  Target Ranges:  Prepandial:   less than 140 mg/dL      Peak postprandial:   less than 180 mg/dL (1-2 hours)      Critically ill patients:  140 - 180 mg/dL   Referral received regarding management of DM. Last HgbA1C on 09/04/2013 was 9.0 %, however her lab hemoglobin at that same time was low and pt has history of anemia due renal disease.  This low hemoglobin level would influence her HgbA1C most probably resulting in a higher value due to lower hemoglobin.. Novolog 70/30 has just started today as well as any correction insulin, thus her cbg's are high presently. Will talk with patient regarding her management at home and assess potential needs for control.  Her kidney disease has to be taken into account as this will infuence how aggressive management should be both here and in the hospital. Thank you, Rosita Kea, RN, CNS, Diabetes Coordinator [phone])

## 2014-04-19 NOTE — Progress Notes (Signed)
Benefit check for insulin ---04/19/2014 1108 by 04/19/2014 1053 by MEGAN SHULAR CMA  Lantus- per rep at [facility]: pen: 30day: approx $150.70// 90 day: approx $356.95 vial: 30 day: approx $100.60// 90 day: approx $237.97   for novolog 70/30 flex pen: 30day: approx $158.71/// 90 day: approx $363.97 vial: 30day: approx $85.40/// 90 day: approx $201.88  both medications have co-pay assistance card if you fill out form online

## 2014-04-19 NOTE — Consult Note (Signed)
Reason for Consult:AKI/CKD Referring Physician: [name] is an 43 y.o. female.  HPI: The patient is a 43 year old female who presents with chronic kidney disease. [name] is a patient referred via Dr. 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 She was seen in our office on 04/16/14 and noted to have gained 11##, + lower ext edema, fatigue, SOB, DOE. She denied cough or orthopnea. She was told to increase her lasix from 160mg  qam and 80mg  q noon to 160mg  bid and also had her lisinopril increased to 40mg  daily.  She presented to [facility] on 04/18/14 with numbness and tingling of her face and left arm.  She was admitted for further evaluation.  We were asked to see the patient due to AKI/CKD when she was noted to have a Scr of 3.14 at time of admission. She started on Procrit for low Hgb of 8.9 and tsat 32% on 04/05/14. She also reports decreased UOP over the last 2 weeks.  The trend in Scr is seen below:.  Trend in Creatinine:  Creatinine, Ser  Date/Time Value Ref Range Status  04/19/2014  4:59 AM 3.19* 0.50 - 1.10 mg/dL Final  04/19/2014  4:39 AM 3.31* 0.50 - 1.10 mg/dL Final  04/18/2014 10:29 PM 2.97* 0.50 - 1.10 mg/dL Final  04/18/2014 12:09 PM 3.14* 0.50 - 1.10 mg/dL Final  03/18/2014  6:45 PM 2.46* 0.50 - 1.10 mg/dL Final  03/01/2014 10:03 PM 2.34* 0.50 - 1.10 mg/dL Final  02/04/2014  1:33 PM 2.32* 0.50 - 1.10 mg/dL Final  09/15/2013  1:49 AM 1.89* 0.50 - 1.10 mg/dL Final  04/03/2013 11:03 AM 1.90* 0.50 - 1.10 mg/dL Final  03/20/2013 12:15 AM 1.77* 0.50 - 1.10 mg/dL Final  12/13/2012  1:45 PM 1.66* 0.50 - 1.10 mg/dL Final  11/21/2012 10:10 PM 1.47* 0.50 - 1.10 mg/dL Final  08/01/2012  6:12 PM 1.78* 0.50 - 1.10 mg/dL Final  03/08/2012 12:16 PM 1.58* 0.50 - 1.10 mg/dL Final  02/19/2012 10:57 PM 1.70* 0.50 - 1.10 mg/dL Final  11/24/2011 12:02 AM 1.70* 0.50 - 1.10 mg/dL Final  09/21/2011  4:55 AM 2.13* 0.50 - 1.10 mg/dL Final   09/20/2011  7:20 AM 1.76* 0.50 - 1.10 mg/dL Final  09/19/2011 10:35 AM 1.52* 0.50 - 1.10 mg/dL Final  01/22/2011  2:00 AM 1.30* 0.4 - 1.2 mg/dL Final  01/21/2011  2:58 PM 1.38* 0.4 - 1.2 mg/dL Final  01/11/2011 10:20 PM 1.46* 0.4 - 1.2 mg/dL Final  10/12/2010 10:15 AM 1.56* 0.4 - 1.2 mg/dL Final  07/09/2010 11:15 AM 1.47* 0.4 - 1.2 mg/dL Final  02/27/2009  4:26 PM 1.4* 0.4 - 1.2 mg/dL Final  09/18/2008  5:45 AM 1.27*  Final  09/17/2008  5:11 AM 1.73 DELTA CHECK NOTED*  Final  09/16/2008  5:40 AM 1.15   Final  09/15/2008  8:33 PM 1.06   Final  09/15/2008 11:39 AM 1.13   Final  12/26/2007  1:30 PM 1.26*  Final  07/01/2007 11:10 PM 1.37*  Final    PMH:   Past Medical History  Diagnosis Date  . Gastroparesis   . Anemia   . IBS (irritable bowel syndrome)   . Nausea   . Hx: UTI (urinary tract infection)   . Fibroids     leiomyoma 8/04 per [name]  . Fibromyalgia   . Diabetes mellitus   . Hypertension   . Chronic kidney disease   . Allergy   .  Blood transfusion without reported diagnosis   . Infertility     eval at [facility]:   Past Surgical History  Procedure Laterality Date  . Eye surgery      bilateral, vitrectomies  . Fibroid tumors removed    . Foot surgery      bilateral    Allergies:  Allergies  Allergen Reactions  . Ibuprofen     CKD stage 3. Should avoid.  . Dilaudid [Hydromorphone Hcl] Itching    Medications:   Prior to Admission medications   Medication Sig Start Date End Date Taking? Authorizing Provider  albuterol (PROVENTIL HFA;VENTOLIN HFA) 108 (90 BASE) MCG/ACT inhaler Inhale 1-2 puffs into the lungs every 6 (six) hours as needed for wheezing or shortness of breath.   Yes Historical Provider, MD  amLODipine (NORVASC) 10 MG tablet Take 20 mg by mouth at bedtime.  09/10/13  Yes Historical Provider, MD  calcitRIOL (ROCALTROL) 0.25 MCG capsule Take 0.5 mcg by mouth at bedtime.    Yes Historical Provider, MD  cyclobenzaprine (FLEXERIL) 10 MG tablet Take 1 tablet (10 mg  total) by mouth 3 (three) times daily as needed for muscle spasms. 03/18/14  Yes Charles B. Karle Starch, MD  furosemide (LASIX) 80 MG tablet Take 160 mg by mouth at bedtime.  08/29/13  Yes Historical Provider, MD  lisinopril (PRINIVIL,ZESTRIL) 10 MG tablet Take 20 mg by mouth every evening.    Yes Historical Provider, MD  NOVOLIN 70/30 RELION (70-30) 100 UNIT/ML injection Inject 45 Units into the skin 2 (two) times daily with a meal.  04/06/13  Yes Historical Provider, MD  pregabalin (LYRICA) 150 MG capsule Take 300 mg by mouth at bedtime.    Yes Historical Provider, MD  ACCU-CHEK SMARTVIEW test strip  09/07/13   Historical Provider, MD    Inpatient medications: . amLODipine  20 mg Oral QHS  . calcitRIOL  0.5 mcg Oral QHS  . insulin aspart  0-15 Units Subcutaneous TID WC  . insulin aspart  0-5 Units Subcutaneous QHS  . insulin glargine  30 Units Subcutaneous QHS  . pregabalin  300 mg Oral QHS  . sodium chloride  3 mL Intravenous Q12H    Discontinued Meds:   Medications Discontinued During This Encounter  Medication Reason  . cephALEXin (KEFLEX) 500 MG capsule Completed Course  . traMADol (ULTRAM) 50 MG tablet Patient has not taken in last 30 days  . insulin aspart protamine- aspart (NOVOLOG MIX 70/30) injection 45 Units   . insulin glargine (LANTUS) injection 30 Units     Social History:  reports that she has never smoked. She has never used smokeless tobacco. She reports that she does not drink alcohol or use illicit drugs.  Family History:   Family History  Problem Relation Age of Onset  . Colon polyps Mother   . Diabetes Mother   . Anesthesia problems Neg Hx   . Hypotension Neg Hx   . Malignant hyperthermia Neg Hx   . Pseudochol deficiency Neg Hx   . Heart murmur Father   . Hypertension Father   . Diabetes Sister   . Kidney failure Sister   . Diabetes Brother   . Retinal degeneration Brother   . Heart murmur Son   . Stroke Paternal Grandmother   . Diabetes Sister     A  comprehensive review of systems was negative except for: Constitutional: positive for fatigue Cardiovascular: positive for lower extremity edema Musculoskeletal: positive for back pain Neurological: positive for numbness and  pain on face and left arm  Weight change:   Intake/Output Summary (Last 24 hours) at 04/19/14 1312 Last data filed at 04/19/14 0800  Gross per 24 hour  Intake   2240 ml  Output    575 ml  Net   1665 ml   BP 117/64  Pulse 98  Temp(Src) 98 F (36.7 C) (Oral)  Resp 18  Ht 5\' 9"  (1.753 m)  Wt 116.665 kg (257 lb 3.2 oz)  BMI 37.96 kg/m2  SpO2 98%  LMP 04/11/2014 Filed Vitals:   04/19/14 0104 04/19/14 0500 04/19/14 0618 04/19/14 1047  BP: 127/80  103/63 117/64  Pulse: 90  92 98  Temp: 97.7 F (36.5 C)  98.1 F (36.7 C) 98 F (36.7 C)  TempSrc: Oral  Oral Oral  Resp: 18  16 18   Height:      Weight:  116.665 kg (257 lb 3.2 oz)    SpO2: 100%  96% 98%     General appearance: alert, cooperative, no distress and morbidly obese Head: Normocephalic, without obvious abnormality, atraumatic Eyes: negative findings: lids and lashes normal, conjunctivae and sclerae normal and corneas clear Neck: no adenopathy, no carotid bruit, no JVD, supple, symmetrical, trachea midline and thyroid not enlarged, symmetric, no tenderness/mass/nodules Resp: clear to auscultation bilaterally Cardio: regular rate and rhythm, S1, S2 normal, no murmur, click, rub or gallop GI: soft, non-tender; bowel sounds normal; no masses,  no organomegaly Extremities: edema trace pretib  Labs: Basic Metabolic Panel:  Recent Labs Lab 04/18/14 1209 04/18/14 2229 04/19/14 0439 04/19/14 0459  NA 133* 134* 135* 135*  K 4.5 4.4 4.2 4.3  CL 98 99 100 100  CO2 21 20 22 21   GLUCOSE 373* 316* 363* 362*  BUN 34* 32* 35* 35*  CREATININE 3.14* 2.97* 3.31* 3.19*  ALBUMIN  --   --   --  2.6*  CALCIUM 8.6 8.6 8.2* 8.0*  PHOS  --   --   --  4.9*   Liver Function Tests:  Recent Labs Lab  04/19/14 0459  AST 15  ALT 24  ALKPHOS 76  BILITOT <0.2*  PROT 6.6  ALBUMIN 2.6*   No results found for this basename: LIPASE, AMYLASE,  in the last 168 hours No results found for this basename: AMMONIA,  in the last 168 hours CBC:  Recent Labs Lab 04/18/14 1209 04/19/14 0439  WBC 5.4 5.1  NEUTROABS  --  2.8  HGB 8.5* 8.0*  HCT 26.9* 25.8*  MCV 92.1 92.8  PLT 247 222   PT/INR: @LABRCNTIP (inr:5) Cardiac Enzymes: )No results found for this basename: CKTOTAL, CKMB, CKMBINDEX, TROPONINI,  in the last 168 hours CBG:  Recent Labs Lab 04/19/14 0658 04/19/14 1158  GLUCAP 342* 70    Iron Studies: No results found for this basename: IRON, TIBC, TRANSFERRIN, FERRITIN,  in the last 168 hours  Xrays/Other Studies: Dg Chest 2 View  04/18/2014   CLINICAL DATA:  Left arm pain and numbness  EXAM: CHEST  2 VIEW  COMPARISON:  02/04/2014  FINDINGS: Thoracolumbar scoliosis and spondylosis. Ill-defined density at the intersection of the right second and sixth ribs on the frontal projection, not well corroborated on the lateral projection.  Cardiac and mediastinal margins appear normal. No pleural effusion identified.  IMPRESSION: 1. Nonspecific ill-defined density at the intersection of the right second and sixth ribs -although conceivably a summation shadow, I cannot exclude early pneumonia. This area appeared clear on 04/03/2013 and accordingly is not thought to be a neoplastic pulmonary  nodule.   Electronically Signed   By: Sherryl Barters M.D.   On: 04/18/2014 12:45   Ct Head Wo Contrast  04/18/2014   CLINICAL DATA:  Headache, left side numbness  EXAM: CT HEAD WITHOUT CONTRAST  TECHNIQUE: Contiguous axial images were obtained from the base of the skull through the vertex without intravenous contrast.  COMPARISON:  05/29/2011  FINDINGS: No skull fracture is noted. Paranasal sinuses and mastoid air cells are unremarkable. No intracranial hemorrhage, mass effect or midline shift.  No  intraventricular hemorrhage. Ventricular size is stable from prior exam.  Subtle mild subcortical white matter decreased attenuation may be due to chronic small vessel ischemic changes. No definite acute cortical infarction. No mass lesion is noted on this unenhanced scan.  IMPRESSION: No intracranial hemorrhage, mass effect or midline shift.Subtle mild subcortical white matter decreased attenuation may be due to chronic small vessel ischemic changes. No definite acute cortical infarction.   Electronically Signed   By: Lahoma Crocker M.D.   On: 04/18/2014 15:47   Mr Brain Wo Contrast  04/18/2014   CLINICAL DATA:  43 year old female with upper extremity numbness and pain radiating to the left side of the body. Left side facial numbness. Initial encounter.  EXAM: MRI HEAD WITHOUT CONTRAST  TECHNIQUE: Multiplanar, multiecho pulse sequences of the brain and surrounding structures were obtained without intravenous contrast.  COMPARISON:  Head CTs without contrast 04/18/2014 and earlier.  FINDINGS: No restricted diffusion to suggest acute infarction. No midline shift, mass effect, evidence of mass lesion, ventriculomegaly, extra-axial collection or acute intracranial hemorrhage. Cervicomedullary junction and pituitary are within normal limits. Negative visualized cervical spine. Major intracranial vascular flow voids are preserved.  Scattered small cerebral white matter T2 and FLAIR hyperintense foci. Central and subcortical white matter predominantly affected. The pattern is nonspecific. The extent is moderate for age. No cortical encephalomalacia. No temporal lobe involvement identified. Deep gray matter nuclei, brainstem and cerebellum are within normal limits.  Visible internal auditory structures appear normal. Mastoids are clear. Occasional small mucous retention cysts in the paranasal sinuses. Visualized orbit soft tissues are within normal limits. Visualized bone marrow signal is within normal limits. Visualized  scalp soft tissues are within normal limits.  IMPRESSION: 1.  No acute intracranial abnormality. 2. Moderate for age nonspecific cerebral white matter signal changes. Differential considerations include accelerated small vessel ischemia, sequelae of prior trauma, hypercoagulable state, vasculitis, migraines, prior infection or demyelination.   Electronically Signed   By: Lars Pinks M.D.   On: 04/18/2014 19:14   US Renal  04/19/2014   CLINICAL DATA:  Elevated creatinine.  EXAM: RENAL/URINARY TRACT ULTRASOUND COMPLETE  COMPARISON:  01/15/2013  FINDINGS: Right Kidney:  Length: 10.5 cm. Echogenicity within normal limits. No mass or hydronephrosis visualized.  Left Kidney:  Length: 10.1 cm. Echogenicity within normal limits. No mass or hydronephrosis visualized.  Bladder:  Foley catheter in place, decompressed and not visualized.  IMPRESSION: No evidence of hydronephrosis. Unremarkable study. Bladder decompressed.   Electronically Signed   By: Rolm Baptise M.D.   On: 04/19/2014 10:45     Assessment/Plan: 1.  AKI/CKD- likely due to ischemic ATN in setting of escalating diuresis with concomitant increase in ACE-I and relative hypotension.  [name] without obstruction.  Scr was 3.14 in January 2015 so not markedly off of his baseline. 1. Agree with holding ACE and lasix.   2. Follow UOP and daily Scr 3. Calculate FeNa 2. Neuro- odd symptoms, possibly related to poorly controlled DM or possible MS.  To f/u with Neuro as an outpt 3. HTN- BP relatively low.  Cont to hold ace-I 4. ACDz- on procrit 30,000 units sq q3 weeks as an outpt but had a significant drop in Hgb over the last month 1. Guaiac stools 2. Check iron studies and for hemolysis 3. Give aranesp while an inpt 5. DM- poorly controlled. Changes to insulin per primary svc 6. Morbid obesity 7. SHPTH- follow Ca/Phos/iPTH on calcitriol 8. Vascular access- pt with progressive CKD due to poorly controlled DM and HTN.  Will need vein mapping and consideration  for an AVF   Donetta Potts 04/19/2014, 1:12 PM

## 2014-04-19 NOTE — Progress Notes (Signed)
[facility]                                                                              Patient Demographics  Heather [name], is a 43 y.o. female, DOB - 1971/03/22, [id number]  Admit date - 04/18/2014   Admitting Physician Robbie Lis, MD  Outpatient Primary MD for the patient is WEAVER, Allen Kell, NP  LOS - 1   Chief Complaint  Patient presents with  . Arm Pain  . Numbness        Assessment & Plan   Acute on chronic kidney disease stage IV -Creatinine currently 3.14, baseline is 2.3 -Patient has not had adequate urine output -Foley catheter in place to monitor intake and output -Nephrology consulted -Renal ultrasound pending -Hold Lasix dose held  Left arm pain and numbness -CT of the head: No intracranial hemorrhage, mass effect or midline shift -MRI of the brain: No acute intracranial abnormality -Neurology was consulted and recommended outpatient neurology followup  Diabetes mellitus -Uncontrolled -Pending hemoglobin A1c -Last hemoglobin A1c in September 2014 of 9.9 -Patient is on NovoLog 7030 however this does not seem to be a good regimen. -Will consult diabetes coronary -Continue insulin sliding scale with CBG monitoring  Anemia of chronic disease -Secondary to chronic kidney disease -Current hemoglobin is stable -Patient was scheduled for Procrit -Will continue to monitor CBC  Hypertension -Continue Norvasc -Lisinopril held due to to worsening kidney function  Diabetic neuropathy -Continue Lyrica   Code Status: Full  Family Communication: None at bedside  Disposition Plan: Admitted  Time Spent in minutes   30 minutes  Procedures None  Consults   Neurology  DVT Prophylaxis  SCDs  Lab Results  Component Value Date   PLT 222 04/19/2014    Medications  Scheduled Meds: . amLODipine  20 mg Oral QHS  . calcitRIOL  0.5 mcg Oral QHS  . insulin aspart  0-15 Units Subcutaneous TID WC  . insulin aspart  0-5 Units Subcutaneous  QHS  . insulin aspart protamine- aspart  45 Units Subcutaneous BID WC  . pregabalin  300 mg Oral QHS  . sodium chloride  3 mL Intravenous Q12H   Continuous Infusions: . sodium chloride     PRN Meds:.acetaminophen, acetaminophen, albuterol, cyclobenzaprine, morphine injection, ondansetron (ZOFRAN) IV, ondansetron  Antibiotics    Anti-infectives   None      Subjective:   Heather [name] seen and examined today.  Patient has no complaints this morning. Denies any chest pain, shortness of breath, abdominal pain. She states her urine output has been decreasing by Dr. She states she gained approximately 11 pounds in 2 weeks.  Objective:   Filed Vitals:   04/18/14 2325 04/19/14 0104 04/19/14 0500 04/19/14 0618  BP: 137/75 127/80  103/63  Pulse: 87 90  92  Temp:  97.7 F (36.5 C)  98.1 F (36.7 C)  TempSrc:  Oral  Oral  Resp: 18 18  16   Height: 5\' 9"  (1.753 m)     Weight: 113.445 kg (250 lb 1.6 oz)  116.665 kg (257 lb 3.2 oz)   SpO2: 97% 100%  96%    Wt Readings from Last 3 Encounters:  04/19/14 [id number]  kg (257 lb 3.2 oz)  09/20/13 109.317 kg (241 lb)  08/28/13 108.41 kg (239 lb)     Intake/Output Summary (Last 24 hours) at 04/19/14 0814 Last data filed at 04/19/14 0700  Gross per 24 hour  Intake   2000 ml  Output    575 ml  Net   1425 ml    Exam  General: Well developed, well nourished, NAD, appears stated age  HEENT: NCAT, PERRLA, EOMI, Anicteic Sclera, mucous membranes moist.   Neck: Supple, no JVD, no masses  Cardiovascular: S1 S2 auscultated, no rubs, murmurs or gallops. Regular rate and rhythm.  Respiratory: Clear to auscultation bilaterally with equal chest rise  Abdomen: Soft, nontender, nondistended, + bowel sounds  Extremities: warm dry without cyanosis clubbing, +1 pitting edema in her extremities bilaterally  Neuro: AAOx3, cranial nerves grossly intact. Strength 5/5 in patient's upper and lower extremities bilaterally  Skin: Without rashes  exudates or nodules  Psych: Normal affect and demeanor with intact judgement and insight  Data Review   Micro Results No results found for this or any previous visit (from the past 240 hour(s)).  Radiology Reports Dg Chest 2 View  04/18/2014   CLINICAL DATA:  Left arm pain and numbness  EXAM: CHEST  2 VIEW  COMPARISON:  02/04/2014  FINDINGS: Thoracolumbar scoliosis and spondylosis. Ill-defined density at the intersection of the right second and sixth ribs on the frontal projection, not well corroborated on the lateral projection.  Cardiac and mediastinal margins appear normal. No pleural effusion identified.  IMPRESSION: 1. Nonspecific ill-defined density at the intersection of the right second and sixth ribs -although conceivably a summation shadow, I cannot exclude early pneumonia. This area appeared clear on 04/03/2013 and accordingly is not thought to be a neoplastic pulmonary nodule.   Electronically Signed   By: Sherryl Barters M.D.   On: 04/18/2014 12:45   Ct Head Wo Contrast  04/18/2014   CLINICAL DATA:  Headache, left side numbness  EXAM: CT HEAD WITHOUT CONTRAST  TECHNIQUE: Contiguous axial images were obtained from the base of the skull through the vertex without intravenous contrast.  COMPARISON:  05/29/2011  FINDINGS: No skull fracture is noted. Paranasal sinuses and mastoid air cells are unremarkable. No intracranial hemorrhage, mass effect or midline shift.  No intraventricular hemorrhage. Ventricular size is stable from prior exam.  Subtle mild subcortical white matter decreased attenuation may be due to chronic small vessel ischemic changes. No definite acute cortical infarction. No mass lesion is noted on this unenhanced scan.  IMPRESSION: No intracranial hemorrhage, mass effect or midline shift.Subtle mild subcortical white matter decreased attenuation may be due to chronic small vessel ischemic changes. No definite acute cortical infarction.   Electronically Signed   By: Lahoma Crocker  M.D.   On: 04/18/2014 15:47   Mr Brain Wo Contrast  04/18/2014   CLINICAL DATA:  43 year old female with upper extremity numbness and pain radiating to the left side of the body. Left side facial numbness. Initial encounter.  EXAM: MRI HEAD WITHOUT CONTRAST  TECHNIQUE: Multiplanar, multiecho pulse sequences of the brain and surrounding structures were obtained without intravenous contrast.  COMPARISON:  Head CTs without contrast 04/18/2014 and earlier.  FINDINGS: No restricted diffusion to suggest acute infarction. No midline shift, mass effect, evidence of mass lesion, ventriculomegaly, extra-axial collection or acute intracranial hemorrhage. Cervicomedullary junction and pituitary are within normal limits. Negative visualized cervical spine. Major intracranial vascular flow voids are preserved.  Scattered small cerebral white matter T2 and FLAIR  hyperintense foci. Central and subcortical white matter predominantly affected. The pattern is nonspecific. The extent is moderate for age. No cortical encephalomalacia. No temporal lobe involvement identified. Deep gray matter nuclei, brainstem and cerebellum are within normal limits.  Visible internal auditory structures appear normal. Mastoids are clear. Occasional small mucous retention cysts in the paranasal sinuses. Visualized orbit soft tissues are within normal limits. Visualized bone marrow signal is within normal limits. Visualized scalp soft tissues are within normal limits.  IMPRESSION: 1.  No acute intracranial abnormality. 2. Moderate for age nonspecific cerebral white matter signal changes. Differential considerations include accelerated small vessel ischemia, sequelae of prior trauma, hypercoagulable state, vasculitis, migraines, prior infection or demyelination.   Electronically Signed   By: Lars Pinks M.D.   On: 04/18/2014 19:14    CBC  Recent Labs Lab 04/18/14 1209 04/19/14 0439  WBC 5.4 5.1  HGB 8.5* 8.0*  HCT 26.9* 25.8*  PLT 247 222    MCV 92.1 92.8  MCH 29.1 28.8  MCHC 31.6 31.0  RDW 14.5 14.4  LYMPHSABS  --  1.9  MONOABS  --  0.4  EOSABS  --  0.1  BASOSABS  --  0.0    Chemistries   Recent Labs Lab 04/18/14 1209 04/18/14 2229 04/19/14 0439 04/19/14 0459  NA 133* 134* 135* 135*  K 4.5 4.4 4.2 4.3  CL 98 99 100 100  CO2 21 20 22 21   GLUCOSE 373* 316* 363* 362*  BUN 34* 32* 35* 35*  CREATININE 3.14* 2.97* 3.31* 3.19*  CALCIUM 8.6 8.6 8.2* 8.0*  MG  --   --   --  1.6  AST  --   --   --  15  ALT  --   --   --  24  ALKPHOS  --   --   --  76  BILITOT  --   --   --  <0.2*   ------------------------------------------------------------------------------------------------------------------ estimated creatinine clearance is 30.7 ml/min (by C-G formula based on Cr of 3.19). ------------------------------------------------------------------------------------------------------------------ No results found for this basename: HGBA1C,  in the last 72 hours ------------------------------------------------------------------------------------------------------------------ No results found for this basename: CHOL, HDL, LDLCALC, TRIG, CHOLHDL, LDLDIRECT,  in the last 72 hours ------------------------------------------------------------------------------------------------------------------  Recent Labs  04/19/14 0500  TSH 2.260   ------------------------------------------------------------------------------------------------------------------ No results found for this basename: VITAMINB12, FOLATE, FERRITIN, TIBC, IRON, RETICCTPCT,  in the last 72 hours  Coagulation profile  Recent Labs Lab 04/19/14 0459  INR 1.00    No results found for this basename: DDIMER,  in the last 72 hours  Cardiac Enzymes No results found for this basename: CK, CKMB, TROPONINI, MYOGLOBIN,  in the last 168 hours ------------------------------------------------------------------------------------------------------------------ No  components found with this basename: POCBNP,     Kandon Hosking D.O. on 04/19/2014 at 8:14 AM  Between 7am to 7pm - Pager - [phone]  After 7pm go to [email or web address] - password [email or web address]  And look for the night coverage person covering for me after hours  [facility] Office  [phone]

## 2014-04-19 NOTE — Progress Notes (Signed)
Received report from Product manager at [facility]. Room ready and awaiting arrival of pt.

## 2014-04-19 NOTE — Progress Notes (Addendum)
Spoke with patient for quite a while regarding her present physical status as well as family concerns.  Pt has been on novolin 70/30 twice a day and states it causes her to be hyioglycemia at times and due to the gastroparesis, she cannot correct it easily.She states that she has been on lantus and novolog in the past and would so like to be able to use instead of fixed doses of 70/30. Due to her long standing DM with gastroparesis, I highly recommend a set dose of lantus and using a correction scale tidwc. Would start pt on a lantus and novolog corrction while here and discontinue the 70/30 (please)Would start with 25-30 units lantus daily or HS and the moderate correction as ordered.  Pt states she has an appt to receive her Procrit here this afternoon.  Will call MD/DO regarding her getting the procrit this afternoon.  Thank you, Heather Kea, RN, CNS, Diabetes Coordinator [phone]) AD: Heather [name] with Mr Ree Kida with my recommendations. She has ordered the lantus to start tonight and correction as is.

## 2014-04-19 NOTE — Progress Notes (Signed)
CM CONSULT FOR INSULIN  Patient has private insurance with [employer]/ works full time and independent prior to admission; Per Chief Technology Officer for Eligibility Benefit Info       Pharmacy  Payer: [employer]   Description: Programmer, systems: [employer]   Telephone: [phone]    Benefit check per Megan CMA --04/19/2014 1053 by Madelin Headings--- per rep at [facility]:  for novolog 70/30  flex pen: 30day: approx $158.71/// 90 day: approx $363.97  vial: 30day: approx $85.40/// 90 day: approx $201.88  both medications have co-pay assistance card if you fill out form online

## 2014-04-19 NOTE — Progress Notes (Signed)
Attempted report, ED RN will call back.

## 2014-04-19 NOTE — ED Provider Notes (Signed)
Medical screening examination/treatment/procedure(s) were performed by non-physician practitioner and as supervising physician I was immediately available for consultation/collaboration.   EKG Interpretation   Date/Time:  Thursday April 18 2014 11:55:04 EDT Ventricular Rate:  94 PR Interval:  132 QRS Duration: 84 QT Interval:  356 QTC Calculation: 445 R Axis:   79 Text Interpretation:  Normal sinus rhythm Normal ECG Confirmed by Dina Rich   MD, Britini Garcilazo ([id number]) on 04/18/2014 11:51:21 PM       Merryl Hacker, MD 04/19/14 1435

## 2014-04-19 NOTE — Progress Notes (Signed)
Pt arrived via Biomedical scientist by nurse tech Les at [phone]. Pt alert and oriented to room, call bell, and bed alarm. Foley catheter placed per order at [facility] with assistance of Allie Dimmer. Pt tolerated well. 500cc drained immediately. Will continue to monitor.

## 2014-04-20 LAB — GLUCOSE, CAPILLARY
GLUCOSE-CAPILLARY: 220 mg/dL — AB (ref 70–99)
GLUCOSE-CAPILLARY: 227 mg/dL — AB (ref 70–99)
Glucose-Capillary: 171 mg/dL — ABNORMAL HIGH (ref 70–99)
Glucose-Capillary: 135 mg/dL — ABNORMAL HIGH (ref 70–99)

## 2014-04-20 LAB — CBC
HEMATOCRIT: 27.2 % — AB (ref 36.0–46.0)
HEMOGLOBIN: 8.5 g/dL — AB (ref 12.0–15.0)
MCH: 29.4 pg (ref 26.0–34.0)
MCHC: 31.3 g/dL (ref 30.0–36.0)
MCV: 94.1 fL (ref 78.0–100.0)
Platelets: 215 10*3/uL (ref 150–400)
RBC: 2.89 MIL/uL — ABNORMAL LOW (ref 3.87–5.11)
RDW: 14.3 % (ref 11.5–15.5)
WBC: 6.5 10*3/uL (ref 4.0–10.5)

## 2014-04-20 LAB — RENAL FUNCTION PANEL
Albumin: 2.5 g/dL — ABNORMAL LOW (ref 3.5–5.2)
BUN: 44 mg/dL — AB (ref 6–23)
CO2: 17 mEq/L — ABNORMAL LOW (ref 19–32)
CREATININE: 3.42 mg/dL — AB (ref 0.50–1.10)
Calcium: 8.2 mg/dL — ABNORMAL LOW (ref 8.4–10.5)
Chloride: 106 mEq/L (ref 96–112)
GFR calc Af Amer: 18 mL/min — ABNORMAL LOW (ref >90)
GFR calc non Af Amer: 15 mL/min — ABNORMAL LOW (ref >90)
GLUCOSE: 170 mg/dL — AB (ref 70–99)
Phosphorus: 4.3 mg/dL (ref 2.3–4.6)
Potassium: 4.5 mEq/L (ref 3.7–5.3)
Sodium: 139 mEq/L (ref 137–147)

## 2014-04-20 LAB — SODIUM, URINE, RANDOM: Sodium, Ur: 42 mEq/L

## 2014-04-20 LAB — HAPTOGLOBIN: HAPTOGLOBIN: 127 mg/dL (ref 45–215)

## 2014-04-20 LAB — CREATININE, URINE, RANDOM: Creatinine, Urine: 117.5 mg/dL

## 2014-04-20 MED ORDER — FUROSEMIDE 10 MG/ML IJ SOLN
160.0000 mg | Freq: Two times a day (BID) | INTRAVENOUS | Status: DC
  Administered 2014-04-20 – 2014-04-22 (×4): 160 mg via INTRAVENOUS
  Filled 2014-04-20 (×5): qty 16

## 2014-04-20 MED ORDER — WHITE PETROLATUM GEL
Status: AC
  Administered 2014-04-20: 13:00:00
  Filled 2014-04-20: qty 5

## 2014-04-20 NOTE — Progress Notes (Signed)
[facility]                                                                              Patient Demographics  Heather [name], is a 43 y.o. female, DOB - 1971/08/03, [id number]  Admit date - 04/18/2014   Admitting Physician Robbie Lis, MD  Outpatient Primary MD for the patient is WEAVER, Allen Kell, NP  LOS - 2   Chief Complaint  Patient presents with  . Arm Pain  . Numbness        Assessment & Plan   Acute on chronic kidney disease stage IV -Creatinine currently 3.42, baseline is 2.3 -Patient has not had adequate urine output -Foley catheter in place to monitor intake and output -Nephrology consulted and appreciated -Renal ultrasound: No evidence of hydronephrosis. Unremarkable study. Bladder decompressed. -Lasix held  Left arm pain and numbness -CT of the head: No intracranial hemorrhage, mass effect or midline shift -MRI of the brain: No acute intracranial abnormality -Neurology was consulted and recommended outpatient neurology followup -improving   Diabetes mellitus -Uncontrolled -Hemoglobin A1c 8.6 -Last hemoglobin A1c in September 2014 of 9.9 -Patient is on NovoLog 7030 however this does not seem to be a good regimen. -Diabetes coordinator consulted and appreicated -Continue insulin sliding scale with CBG monitoring -Added lantus  Anemia of chronic disease -Secondary to chronic kidney disease -Current hemoglobin is stable -Will continue to monitor CBC  Hypertension -Continue Norvasc -Lisinopril held due to to worsening kidney function  Diabetic neuropathy -Continue Lyrica  Code Status: Full  Family Communication: None at bedside  Disposition Plan: Admitted  Time Spent in minutes   30 minutes  Procedures  Renal Ultrasound: No evidence of hydronephrosis. Unremarkable study. Bladder decompressed.  Consults   Neurology  DVT Prophylaxis  SCDs  Lab Results  Component Value Date   PLT 215 04/20/2014    Medications  Scheduled  Meds: . amLODipine  20 mg Oral QHS  . calcitRIOL  0.5 mcg Oral QHS  . darbepoetin (ARANESP) injection - NON-DIALYSIS  100 mcg Subcutaneous Q Fri-1800  . insulin aspart  0-15 Units Subcutaneous TID WC  . insulin aspart  0-5 Units Subcutaneous QHS  . insulin glargine  30 Units Subcutaneous QHS  . pregabalin  300 mg Oral QHS  . sodium chloride  3 mL Intravenous Q12H   Continuous Infusions:   PRN Meds:.acetaminophen, acetaminophen, albuterol, cyclobenzaprine, morphine injection, ondansetron (ZOFRAN) IV, ondansetron  Antibiotics    Anti-infectives   None      Subjective:   Dorris Singh seen and examined today.  Patient has no complaints this morning. Denies any chest pain, shortness of breath, abdominal pain. Does complain of some right lower back pain.  Objective:   Filed Vitals:   04/19/14 1719 04/19/14 2141 04/20/14 0500 04/20/14 0536  BP: 129/66 130/72  105/66  Pulse: 95 92  88  Temp: 98.4 F (36.9 C) 98 F (36.7 C)  97.8 F (36.6 C)  TempSrc: Oral Oral  Oral  Resp: 18 18  18   Height:      Weight:   113.67 kg (250 lb 9.6 oz)   SpO2: 98% 96%  100%    Wt Readings from Last  3 Encounters:  04/20/14 113.67 kg (250 lb 9.6 oz)  09/20/13 109.317 kg (241 lb)  08/28/13 108.41 kg (239 lb)     Intake/Output Summary (Last 24 hours) at 04/20/14 0846 Last data filed at 04/19/14 1930  Gross per 24 hour  Intake 618.33 ml  Output    300 ml  Net 318.33 ml    Exam  General: Well developed, well nourished, NAD, appears stated age  HEENT: NCAT, mucous membranes moist.   Neck: Supple, no JVD, no masses  Cardiovascular: S1 S2 auscultated, no rubs, murmurs or gallops. Regular rate and rhythm.  Respiratory: Clear to auscultation bilaterally with equal chest rise  Abdomen: Soft, nontender, nondistended, + bowel sounds  Extremities: warm dry without cyanosis clubbing, +1 pitting edema in her extremities bilaterally  Neuro: AAOx3, cranial nerves grossly intact. Strength 5/5  in patient's upper and lower extremities bilaterally  Skin: Without rashes exudates or nodules  Psych: Normal affect and demeanor with intact judgement and insight, worried  Data Review   Micro Results No results found for this or any previous visit (from the past 240 hour(s)).  Radiology Reports Dg Chest 2 View  04/18/2014   CLINICAL DATA:  Left arm pain and numbness  EXAM: CHEST  2 VIEW  COMPARISON:  02/04/2014  FINDINGS: Thoracolumbar scoliosis and spondylosis. Ill-defined density at the intersection of the right second and sixth ribs on the frontal projection, not well corroborated on the lateral projection.  Cardiac and mediastinal margins appear normal. No pleural effusion identified.  IMPRESSION: 1. Nonspecific ill-defined density at the intersection of the right second and sixth ribs -although conceivably a summation shadow, I cannot exclude early pneumonia. This area appeared clear on 04/03/2013 and accordingly is not thought to be a neoplastic pulmonary nodule.   Electronically Signed   By: Sherryl Barters M.D.   On: 04/18/2014 12:45   Ct Head Wo Contrast  04/18/2014   CLINICAL DATA:  Headache, left side numbness  EXAM: CT HEAD WITHOUT CONTRAST  TECHNIQUE: Contiguous axial images were obtained from the base of the skull through the vertex without intravenous contrast.  COMPARISON:  05/29/2011  FINDINGS: No skull fracture is noted. Paranasal sinuses and mastoid air cells are unremarkable. No intracranial hemorrhage, mass effect or midline shift.  No intraventricular hemorrhage. Ventricular size is stable from prior exam.  Subtle mild subcortical white matter decreased attenuation may be due to chronic small vessel ischemic changes. No definite acute cortical infarction. No mass lesion is noted on this unenhanced scan.  IMPRESSION: No intracranial hemorrhage, mass effect or midline shift.Subtle mild subcortical white matter decreased attenuation may be due to chronic small vessel ischemic  changes. No definite acute cortical infarction.   Electronically Signed   By: Lahoma Crocker M.D.   On: 04/18/2014 15:47   Mr Brain Wo Contrast  04/18/2014   CLINICAL DATA:  43 year old female with upper extremity numbness and pain radiating to the left side of the body. Left side facial numbness. Initial encounter.  EXAM: MRI HEAD WITHOUT CONTRAST  TECHNIQUE: Multiplanar, multiecho pulse sequences of the brain and surrounding structures were obtained without intravenous contrast.  COMPARISON:  Head CTs without contrast 04/18/2014 and earlier.  FINDINGS: No restricted diffusion to suggest acute infarction. No midline shift, mass effect, evidence of mass lesion, ventriculomegaly, extra-axial collection or acute intracranial hemorrhage. Cervicomedullary junction and pituitary are within normal limits. Negative visualized cervical spine. Major intracranial vascular flow voids are preserved.  Scattered small cerebral white matter T2 and FLAIR hyperintense foci.  Central and subcortical white matter predominantly affected. The pattern is nonspecific. The extent is moderate for age. No cortical encephalomalacia. No temporal lobe involvement identified. Deep gray matter nuclei, brainstem and cerebellum are within normal limits.  Visible internal auditory structures appear normal. Mastoids are clear. Occasional small mucous retention cysts in the paranasal sinuses. Visualized orbit soft tissues are within normal limits. Visualized bone marrow signal is within normal limits. Visualized scalp soft tissues are within normal limits.  IMPRESSION: 1.  No acute intracranial abnormality. 2. Moderate for age nonspecific cerebral white matter signal changes. Differential considerations include accelerated small vessel ischemia, sequelae of prior trauma, hypercoagulable state, vasculitis, migraines, prior infection or demyelination.   Electronically Signed   By: Lars Pinks M.D.   On: 04/18/2014 19:14    CBC  Recent Labs Lab  04/18/14 1209 04/19/14 0439 04/20/14 0523  WBC 5.4 5.1 6.5  HGB 8.5* 8.0* 8.5*  HCT 26.9* 25.8* 27.2*  PLT 247 222 215  MCV 92.1 92.8 94.1  MCH 29.1 28.8 29.4  MCHC 31.6 31.0 31.3  RDW 14.5 14.4 14.3  LYMPHSABS  --  1.9  --   MONOABS  --  0.4  --   EOSABS  --  0.1  --   BASOSABS  --  0.0  --     Chemistries   Recent Labs Lab 04/18/14 1209 04/18/14 2229 04/19/14 0439 04/19/14 0459 04/20/14 0523  NA 133* 134* 135* 135* 139  K 4.5 4.4 4.2 4.3 4.5  CL 98 99 100 100 106  CO2 21 20 22 21  17*  GLUCOSE 373* 316* 363* 362* 170*  BUN 34* 32* 35* 35* 44*  CREATININE 3.14* 2.97* 3.31* 3.19* 3.42*  CALCIUM 8.6 8.6 8.2* 8.0* 8.2*  MG  --   --   --  1.6  --   AST  --   --   --  15  --   ALT  --   --   --  24  --   ALKPHOS  --   --   --  76  --   BILITOT  --   --   --  <0.2*  --    ------------------------------------------------------------------------------------------------------------------ estimated creatinine clearance is 28.2 ml/min (by C-G formula based on Cr of 3.42). ------------------------------------------------------------------------------------------------------------------  Recent Labs  04/19/14 0459  HGBA1C 8.6*   ------------------------------------------------------------------------------------------------------------------ No results found for this basename: CHOL, HDL, LDLCALC, TRIG, CHOLHDL, LDLDIRECT,  in the last 72 hours ------------------------------------------------------------------------------------------------------------------  Recent Labs  04/19/14 0500  TSH 2.260   ------------------------------------------------------------------------------------------------------------------  Recent Labs  04/19/14 1630  FERRITIN 125  TIBC 283  IRON 34*    Coagulation profile  Recent Labs Lab 04/19/14 0459  INR 1.00    No results found for this basename: DDIMER,  in the last 72 hours  Cardiac Enzymes No results found for this  basename: CK, CKMB, TROPONINI, MYOGLOBIN,  in the last 168 hours ------------------------------------------------------------------------------------------------------------------ No components found with this basename: POCBNP,     Dayla Gasca D.O. on 04/20/2014 at 8:46 AM  Between 7am to 7pm - Pager - [phone]  After 7pm go to [email or web address] - password [email or web address]  And look for the night coverage person covering for me after hours  [facility] Office  [phone]

## 2014-04-20 NOTE — Progress Notes (Signed)
Patient ID: HONESTI PATSCHKE, female   DOB: 1971/04/05, 43 y.o.   MRN: [id number] S:feels tired today O:BP 148/81  Pulse 107  Temp(Src) 97.9 F (36.6 C) (Oral)  Resp 18  Ht 5\' 9"  (1.753 m)  Wt 113.67 kg (250 lb 9.6 oz)  BMI 36.99 kg/m2  SpO2 100%  LMP 04/11/2014  Intake/Output Summary (Last 24 hours) at 04/20/14 1111 Last data filed at 04/20/14 0800  Gross per 24 hour  Intake 738.33 ml  Output    300 ml  Net 438.33 ml   Intake/Output: I/O last 3 completed shifts: In: 2858.3 [P.O.:980; I.V.:1878.3] Out: 875 [Urine:875]  Intake/Output this shift:  Total I/O In: 120 [P.O.:120] Out: -  Weight change: -0.001 kg (-0.1 oz) Gen:WD obese AAF in NAd CVS:no rub Resp:cta [id number], +BS Ext:1+pretib edema   Recent Labs Lab 04/18/14 1209 04/18/14 2229 04/19/14 0439 04/19/14 0459 04/20/14 0523  NA 133* 134* 135* 135* 139  K 4.5 4.4 4.2 4.3 4.5  CL 98 99 100 100 106  CO2 21 20 22 21  17*  GLUCOSE 373* 316* 363* 362* 170*  BUN 34* 32* 35* 35* 44*  CREATININE 3.14* 2.97* 3.31* 3.19* 3.42*  ALBUMIN  --   --   --  2.6* 2.5*  CALCIUM 8.6 8.6 8.2* 8.0* 8.2*  PHOS  --   --   --  4.9* 4.3  AST  --   --   --  15  --   ALT  --   --   --  24  --    Liver Function Tests:  Recent Labs Lab 04/19/14 0459 04/20/14 0523  AST 15  --   ALT 24  --   ALKPHOS 76  --   BILITOT <0.2*  --   PROT 6.6  --   ALBUMIN 2.6* 2.5*   No results found for this basename: LIPASE, AMYLASE,  in the last 168 hours No results found for this basename: AMMONIA,  in the last 168 hours CBC:  Recent Labs Lab 04/18/14 1209 04/19/14 0439 04/20/14 0523  WBC 5.4 5.1 6.5  NEUTROABS  --  2.8  --   HGB 8.5* 8.0* 8.5*  HCT 26.9* 25.8* 27.2*  MCV 92.1 92.8 94.1  PLT 247 222 215   Cardiac Enzymes: No results found for this basename: CKTOTAL, CKMB, CKMBINDEX, TROPONINI,  in the last 168 hours CBG:  Recent Labs Lab 04/19/14 0658 04/19/14 1158 04/19/14 1625 04/19/14 2150 04/20/14 0654  GLUCAP 342*  70 208* 158* 171*    Iron Studies:  Recent Labs  04/19/14 1630  IRON 34*  TIBC 283  FERRITIN 125   Studies/Results: Dg Chest 2 View  04/18/2014   CLINICAL DATA:  Left arm pain and numbness  EXAM: CHEST  2 VIEW  COMPARISON:  02/04/2014  FINDINGS: Thoracolumbar scoliosis and spondylosis. Ill-defined density at the intersection of the right second and sixth ribs on the frontal projection, not well corroborated on the lateral projection.  Cardiac and mediastinal margins appear normal. No pleural effusion identified.  IMPRESSION: 1. Nonspecific ill-defined density at the intersection of the right second and sixth ribs -although conceivably a summation shadow, I cannot exclude early pneumonia. This area appeared clear on 04/03/2013 and accordingly is not thought to be a neoplastic pulmonary nodule.   Electronically Signed   By: Sherryl Barters M.D.   On: 04/18/2014 12:45   Ct Head Wo Contrast  04/18/2014   CLINICAL DATA:  Headache, left side numbness  EXAM: CT HEAD WITHOUT  CONTRAST  TECHNIQUE: Contiguous axial images were obtained from the base of the skull through the vertex without intravenous contrast.  COMPARISON:  05/29/2011  FINDINGS: No skull fracture is noted. Paranasal sinuses and mastoid air cells are unremarkable. No intracranial hemorrhage, mass effect or midline shift.  No intraventricular hemorrhage. Ventricular size is stable from prior exam.  Subtle mild subcortical white matter decreased attenuation may be due to chronic small vessel ischemic changes. No definite acute cortical infarction. No mass lesion is noted on this unenhanced scan.  IMPRESSION: No intracranial hemorrhage, mass effect or midline shift.Subtle mild subcortical white matter decreased attenuation may be due to chronic small vessel ischemic changes. No definite acute cortical infarction.   Electronically Signed   By: Lahoma Crocker M.D.   On: 04/18/2014 15:47   Mr Brain Wo Contrast  04/18/2014   CLINICAL DATA:  43 year old  female with upper extremity numbness and pain radiating to the left side of the body. Left side facial numbness. Initial encounter.  EXAM: MRI HEAD WITHOUT CONTRAST  TECHNIQUE: Multiplanar, multiecho pulse sequences of the brain and surrounding structures were obtained without intravenous contrast.  COMPARISON:  Head CTs without contrast 04/18/2014 and earlier.  FINDINGS: No restricted diffusion to suggest acute infarction. No midline shift, mass effect, evidence of mass lesion, ventriculomegaly, extra-axial collection or acute intracranial hemorrhage. Cervicomedullary junction and pituitary are within normal limits. Negative visualized cervical spine. Major intracranial vascular flow voids are preserved.  Scattered small cerebral white matter T2 and FLAIR hyperintense foci. Central and subcortical white matter predominantly affected. The pattern is nonspecific. The extent is moderate for age. No cortical encephalomalacia. No temporal lobe involvement identified. Deep gray matter nuclei, brainstem and cerebellum are within normal limits.  Visible internal auditory structures appear normal. Mastoids are clear. Occasional small mucous retention cysts in the paranasal sinuses. Visualized orbit soft tissues are within normal limits. Visualized bone marrow signal is within normal limits. Visualized scalp soft tissues are within normal limits.  IMPRESSION: 1.  No acute intracranial abnormality. 2. Moderate for age nonspecific cerebral white matter signal changes. Differential considerations include accelerated small vessel ischemia, sequelae of prior trauma, hypercoagulable state, vasculitis, migraines, prior infection or demyelination.   Electronically Signed   By: Lars Pinks M.D.   On: 04/18/2014 19:14   US Renal  04/19/2014   CLINICAL DATA:  Elevated creatinine.  EXAM: RENAL/URINARY TRACT ULTRASOUND COMPLETE  COMPARISON:  01/15/2013  FINDINGS: Right Kidney:  Length: 10.5 cm. Echogenicity within normal limits. No mass  or hydronephrosis visualized.  Left Kidney:  Length: 10.1 cm. Echogenicity within normal limits. No mass or hydronephrosis visualized.  Bladder:  Foley catheter in place, decompressed and not visualized.  IMPRESSION: No evidence of hydronephrosis. Unremarkable study. Bladder decompressed.   Electronically Signed   By: Rolm Baptise M.D.   On: 04/19/2014 10:45   . amLODipine  20 mg Oral QHS  . calcitRIOL  0.5 mcg Oral QHS  . darbepoetin (ARANESP) injection - NON-DIALYSIS  100 mcg Subcutaneous Q Fri-1800  . insulin aspart  0-15 Units Subcutaneous TID WC  . insulin aspart  0-5 Units Subcutaneous QHS  . insulin glargine  30 Units Subcutaneous QHS  . pregabalin  300 mg Oral QHS  . sodium chloride  3 mL Intravenous Q12H  . white petrolatum        BMET    Component Value Date/Time   NA 139 04/20/2014 0523   NA 137 10/16/2013   K 4.5 04/20/2014 0523   K 4.3 10/16/2013  CL 106 04/20/2014 0523   CL 100 10/16/2013   CO2 17* 04/20/2014 0523   GLUCOSE 170* 04/20/2014 0523   BUN 44* 04/20/2014 0523   BUN 31* 10/16/2013   CREATININE 3.42* 04/20/2014 0523   CREATININE 2.25 10/16/2013   CALCIUM 8.2* 04/20/2014 0523   CALCIUM 8.9 10/16/2013   GFRNONAA 15* 04/20/2014 0523   GFRNONAA 26 10/16/2013   GFRAA 18* 04/20/2014 0523   GFRAA 30 10/16/2013   CBC    Component Value Date/Time   WBC 6.5 04/20/2014 0523   RBC 2.89* 04/20/2014 0523   RBC  Value: 3.50 CORRECTED ON 09/21 AT 0959: PREVIOUSLY REPORTED AS 3.40* 09/15/2008 2033   HGB 8.5* 04/20/2014 0523   HCT 27.2* 04/20/2014 0523   PLT 215 04/20/2014 0523   MCV 94.1 04/20/2014 0523   MCH 29.4 04/20/2014 0523   MCHC 31.3 04/20/2014 0523   RDW 14.3 04/20/2014 0523   LYMPHSABS 1.9 04/19/2014 0439   MONOABS 0.4 04/19/2014 0439   EOSABS 0.1 04/19/2014 0439   BASOSABS 0.0 04/19/2014 0439     Assessment/Plan:  1. AKI/CKD- likely due to ischemic ATN in setting of escalating diuresis with concomitant increase in ACE-I and relative hypotension. [name] without  obstruction. Scr was 3.14 in January 2015 so not markedly off of his baseline. Likely acute on chronic CHF due to ischemia with an acute drop in Hgb in setting of escalating diuresis and ace-inhibition.  Off ace and will need to evaluate EF.  Possible cardiorenal syndrome. 1. Scr initially decreased after holding ACE and lasix but has increased slightly and she has had poor UOP.  2. Follow UOP and daily Scr 3. FeNa and SPEP/UPEP pending. 4. Stressed the need for diabetes control, BP control, and weight loss 2. CHF- mainly diastolic by exam.  Will need ECHO to evaluate EF as well as r/o pulmonary HTN as she has a high pre-test probability of having OSA 1. Restrict Na and fluid 2. Obtain sleep study  3. Cardiology eval if not already being followed by them.  3. Neuro- odd symptoms, possibly related to poorly controlled DM or possible MS. To f/u with Neuro as an outpt 4. HTN- BP relatively low. Cont to hold ace-I 5. ACDz- on procrit 30,000 units sq q3 weeks as an outpt but had a significant drop in Hgb over the last month  1. Guaiac stools 2. Check iron studies and for hemolysis 3. Give aranesp while an inpt 6. DM- poorly controlled. Changes to insulin per primary svc 1. Recommend dietician/nutritionist consult while an inpt 7. Morbid obesity 8. SHPTH- follow Ca/Phos/iPTH on calcitriol 9. Vascular access- pt with progressive CKD due to poorly controlled DM and HTN. Will need vein mapping and consideration for an AVF 10.   Governor [place] Aarya Robinson

## 2014-04-21 DIAGNOSIS — I509 Heart failure, unspecified: Secondary | ICD-10-CM

## 2014-04-21 LAB — CBC
HCT: 26.6 % — ABNORMAL LOW (ref 36.0–46.0)
Hemoglobin: 8.3 g/dL — ABNORMAL LOW (ref 12.0–15.0)
MCH: 29.1 pg (ref 26.0–34.0)
MCHC: 31.2 g/dL (ref 30.0–36.0)
MCV: 93.3 fL (ref 78.0–100.0)
Platelets: 216 10*3/uL (ref 150–400)
RBC: 2.85 MIL/uL — ABNORMAL LOW (ref 3.87–5.11)
RDW: 14.2 % (ref 11.5–15.5)
WBC: 6.7 10*3/uL (ref 4.0–10.5)

## 2014-04-21 LAB — RENAL FUNCTION PANEL
Albumin: 2.5 g/dL — ABNORMAL LOW (ref 3.5–5.2)
BUN: 45 mg/dL — ABNORMAL HIGH (ref 6–23)
CALCIUM: 8.6 mg/dL (ref 8.4–10.5)
CO2: 21 mEq/L (ref 19–32)
Chloride: 103 mEq/L (ref 96–112)
Creatinine, Ser: 3.11 mg/dL — ABNORMAL HIGH (ref 0.50–1.10)
GFR, EST AFRICAN AMERICAN: 20 mL/min — AB (ref >90)
GFR, EST NON AFRICAN AMERICAN: 17 mL/min — AB (ref >90)
Glucose, Bld: 235 mg/dL — ABNORMAL HIGH (ref 70–99)
PHOSPHORUS: 4.9 mg/dL — AB (ref 2.3–4.6)
Potassium: 4.6 mEq/L (ref 3.7–5.3)
SODIUM: 138 meq/L (ref 137–147)

## 2014-04-21 LAB — GLUCOSE, CAPILLARY
Glucose-Capillary: 267 mg/dL — ABNORMAL HIGH (ref 70–99)
Glucose-Capillary: 187 mg/dL — ABNORMAL HIGH (ref 70–99)
Glucose-Capillary: 219 mg/dL — ABNORMAL HIGH (ref 70–99)

## 2014-04-21 LAB — PRO B NATRIURETIC PEPTIDE: PRO B NATRI PEPTIDE: 120.4 pg/mL (ref 0–125)

## 2014-04-21 NOTE — Progress Notes (Signed)
[facility]                                                                              Patient Demographics  Heather [name], is a 43 y.o. female, DOB - 1971-08-14, [id number]  Admit date - 04/18/2014   Admitting Physician Robbie Lis, MD  Outpatient Primary MD for the patient is WEAVER, Allen Kell, NP  LOS - 3   Chief Complaint  Patient presents with  . Arm Pain  . Numbness        Assessment & Plan   Acute on chronic kidney disease stage IV -Creatinine currently 3.11, baseline is 2.3 -Likely due to ATN in the setting of diuresis, ACEI and hypotension (per nephrology) -Patient has not had adequate urine output -Foley catheter in place to monitor intake and output -Nephrology consulted and appreciated -Renal ultrasound: No evidence of hydronephrosis. Unremarkable study. Bladder decompressed. -Lasix held -Echocardiogram pending (?cardio renal syndrome) -Pending results, may consult cardiology -FeNa <0.9%  Left arm pain and numbness -CT of the head: No intracranial hemorrhage, mass effect or midline shift -MRI of the brain: No acute intracranial abnormality -Neurology was consulted and recommended outpatient neurology followup -improving   Diabetes mellitus -Uncontrolled -Hemoglobin A1c 8.6 -Last hemoglobin A1c in September 2014 of 9.9 -Patient is on NovoLog 7030 however this does not seem to be a good regimen. -Diabetes coordinator consulted and appreicated -Continue lantus, insulin sliding scale with CBG monitoring  Anemia of chronic disease -Secondary to chronic kidney disease -Current hemoglobin is stable -Will continue to monitor CBC -Patient has been receiving Procrit as an outpatient -Anemia panel: iron 34, TIBC 283, ferritin 125  Hypertension -Continue Norvasc -Lisinopril held due to to worsening kidney function  Diabetic neuropathy -Continue Lyrica  Code Status: Full  Family Communication: Husband at bedside  Disposition Plan:  Admitted  Time Spent in minutes   30 minutes  Procedures  Renal Ultrasound: No evidence of hydronephrosis. Unremarkable study. Bladder decompressed.  Echocardiogram  Consults   Neurology Nephrology  DVT Prophylaxis  SCDs  Lab Results  Component Value Date   PLT 216 04/21/2014    Medications  Scheduled Meds: . amLODipine  20 mg Oral QHS  . calcitRIOL  0.5 mcg Oral QHS  . darbepoetin (ARANESP) injection - NON-DIALYSIS  100 mcg Subcutaneous Q Fri-1800  . furosemide  160 mg Intravenous BID  . insulin aspart  0-15 Units Subcutaneous TID WC  . insulin aspart  0-5 Units Subcutaneous QHS  . insulin glargine  30 Units Subcutaneous QHS  . pregabalin  300 mg Oral QHS  . sodium chloride  3 mL Intravenous Q12H   Continuous Infusions:   PRN Meds:.acetaminophen, acetaminophen, albuterol, cyclobenzaprine, morphine injection, ondansetron (ZOFRAN) IV, ondansetron  Antibiotics    Anti-infectives   None      Subjective:   Heather [name] seen and examined today.  Patient has no complaints this morning. Denies any chest pain, shortness of breath, abdominal pain. Currently having echocardiogram.  Objective:   Filed Vitals:   04/20/14 1001 04/20/14 1819 04/21/14 0500 04/21/14 0506  BP: 148/81 150/86  110/65  Pulse: 107 101  91  Temp: 97.9 F (36.6 C) 98 F (36.7 C)  98.2 F (36.8 C)  TempSrc: Oral Oral  Oral  Resp: 18 18  20   Height:      Weight:   117 kg (257 lb 15 oz)   SpO2: 100% 100%  94%    Wt Readings from Last 3 Encounters:  04/21/14 117 kg (257 lb 15 oz)  09/20/13 109.317 kg (241 lb)  08/28/13 108.41 kg (239 lb)     Intake/Output Summary (Last 24 hours) at 04/21/14 0849 Last data filed at 04/21/14 0740  Gross per 24 hour  Intake    509 ml  Output   3325 ml  Net  -2816 ml    Exam  General: Well developed, well nourished, NAD, appears stated age  HEENT: NCAT, mucous membranes moist.   Neck: Supple, no JVD, no masses  Cardiovascular: S1 S2  auscultated, no rubs, murmurs or gallops. Regular rate and rhythm.  Respiratory: Clear to auscultation bilaterally with equal chest rise  Abdomen: Soft, nontender, nondistended, + bowel sounds  Extremities: warm dry without cyanosis clubbing, +1 pitting edema in her extremities bilaterally  Neuro: AAOx3, No focal deficits  Skin: Without rashes exudates or nodules  Psych: Normal affect and demeanor with intact judgement and insight, worried  Data Review   Micro Results No results found for this or any previous visit (from the past 240 hour(s)).  Radiology Reports Dg Chest 2 View  04/18/2014   CLINICAL DATA:  Left arm pain and numbness  EXAM: CHEST  2 VIEW  COMPARISON:  02/04/2014  FINDINGS: Thoracolumbar scoliosis and spondylosis. Ill-defined density at the intersection of the right second and sixth ribs on the frontal projection, not well corroborated on the lateral projection.  Cardiac and mediastinal margins appear normal. No pleural effusion identified.  IMPRESSION: 1. Nonspecific ill-defined density at the intersection of the right second and sixth ribs -although conceivably a summation shadow, I cannot exclude early pneumonia. This area appeared clear on 04/03/2013 and accordingly is not thought to be a neoplastic pulmonary nodule.   Electronically Signed   By: Sherryl Barters M.D.   On: 04/18/2014 12:45   Ct Head Wo Contrast  04/18/2014   CLINICAL DATA:  Headache, left side numbness  EXAM: CT HEAD WITHOUT CONTRAST  TECHNIQUE: Contiguous axial images were obtained from the base of the skull through the vertex without intravenous contrast.  COMPARISON:  05/29/2011  FINDINGS: No skull fracture is noted. Paranasal sinuses and mastoid air cells are unremarkable. No intracranial hemorrhage, mass effect or midline shift.  No intraventricular hemorrhage. Ventricular size is stable from prior exam.  Subtle mild subcortical white matter decreased attenuation may be due to chronic small vessel  ischemic changes. No definite acute cortical infarction. No mass lesion is noted on this unenhanced scan.  IMPRESSION: No intracranial hemorrhage, mass effect or midline shift.Subtle mild subcortical white matter decreased attenuation may be due to chronic small vessel ischemic changes. No definite acute cortical infarction.   Electronically Signed   By: Lahoma Crocker M.D.   On: 04/18/2014 15:47   Mr Brain Wo Contrast  04/18/2014   CLINICAL DATA:  43 year old female with upper extremity numbness and pain radiating to the left side of the body. Left side facial numbness. Initial encounter.  EXAM: MRI HEAD WITHOUT CONTRAST  TECHNIQUE: Multiplanar, multiecho pulse sequences of the brain and surrounding structures were obtained without intravenous contrast.  COMPARISON:  Head CTs without contrast 04/18/2014 and earlier.  FINDINGS: No restricted diffusion to suggest acute infarction. No midline shift, mass effect, evidence of  mass lesion, ventriculomegaly, extra-axial collection or acute intracranial hemorrhage. Cervicomedullary junction and pituitary are within normal limits. Negative visualized cervical spine. Major intracranial vascular flow voids are preserved.  Scattered small cerebral white matter T2 and FLAIR hyperintense foci. Central and subcortical white matter predominantly affected. The pattern is nonspecific. The extent is moderate for age. No cortical encephalomalacia. No temporal lobe involvement identified. Deep gray matter nuclei, brainstem and cerebellum are within normal limits.  Visible internal auditory structures appear normal. Mastoids are clear. Occasional small mucous retention cysts in the paranasal sinuses. Visualized orbit soft tissues are within normal limits. Visualized bone marrow signal is within normal limits. Visualized scalp soft tissues are within normal limits.  IMPRESSION: 1.  No acute intracranial abnormality. 2. Moderate for age nonspecific cerebral white matter signal changes.  Differential considerations include accelerated small vessel ischemia, sequelae of prior trauma, hypercoagulable state, vasculitis, migraines, prior infection or demyelination.   Electronically Signed   By: Lars Pinks M.D.   On: 04/18/2014 19:14    CBC  Recent Labs Lab 04/18/14 1209 04/19/14 0439 04/20/14 0523 04/21/14 0500  WBC 5.4 5.1 6.5 6.7  HGB 8.5* 8.0* 8.5* 8.3*  HCT 26.9* 25.8* 27.2* 26.6*  PLT 247 222 215 216  MCV 92.1 92.8 94.1 93.3  MCH 29.1 28.8 29.4 29.1  MCHC 31.6 31.0 31.3 31.2  RDW 14.5 14.4 14.3 14.2  LYMPHSABS  --  1.9  --   --   MONOABS  --  0.4  --   --   EOSABS  --  0.1  --   --   BASOSABS  --  0.0  --   --     Chemistries   Recent Labs Lab 04/18/14 2229 04/19/14 0439 04/19/14 0459 04/20/14 0523 04/21/14 0500  NA 134* 135* 135* 139 138  K 4.4 4.2 4.3 4.5 4.6  CL 99 100 100 106 103  CO2 20 22 21  17* 21  GLUCOSE 316* 363* 362* 170* 235*  BUN 32* 35* 35* 44* 45*  CREATININE 2.97* 3.31* 3.19* 3.42* 3.11*  CALCIUM 8.6 8.2* 8.0* 8.2* 8.6  MG  --   --  1.6  --   --   AST  --   --  15  --   --   ALT  --   --  24  --   --   ALKPHOS  --   --  76  --   --   BILITOT  --   --  <0.2*  --   --    ------------------------------------------------------------------------------------------------------------------ estimated creatinine clearance is 31.5 ml/min (by C-G formula based on Cr of 3.11). ------------------------------------------------------------------------------------------------------------------  Recent Labs  04/19/14 0459  HGBA1C 8.6*   ------------------------------------------------------------------------------------------------------------------ No results found for this basename: CHOL, HDL, LDLCALC, TRIG, CHOLHDL, LDLDIRECT,  in the last 72 hours ------------------------------------------------------------------------------------------------------------------  Recent Labs  04/19/14 0500  TSH 2.260    ------------------------------------------------------------------------------------------------------------------  Recent Labs  04/19/14 1630  FERRITIN 125  TIBC 283  IRON 34*    Coagulation profile  Recent Labs Lab 04/19/14 0459  INR 1.00    No results found for this basename: DDIMER,  in the last 72 hours  Cardiac Enzymes No results found for this basename: CK, CKMB, TROPONINI, MYOGLOBIN,  in the last 168 hours ------------------------------------------------------------------------------------------------------------------ No components found with this basename: POCBNP,     Samer Dutton D.O. on 04/21/2014 at 8:49 AM  Between 7am to 7pm - Pager - [phone]  After 7pm go to [email or web address] - password [email or web address]  And  look for the night coverage person covering for me after hours  [facility] Office  [phone]

## 2014-04-21 NOTE — Progress Notes (Signed)
Patient ID: Heather [name], female   DOB: [birth year], 43 y.o.   MRN: [id number] S:No new complaints O:BP 110/65  Pulse 91  Temp(Src) 98.2 F (36.8 C) (Oral)  Resp 20  Ht 5\' 9"  (1.753 m)  Wt 117 kg (257 lb 15 oz)  BMI 38.07 kg/m2  SpO2 94%  LMP 04/11/2014  Intake/Output Summary (Last 24 hours) at 04/21/14 1024 Last data filed at 04/21/14 1010  Gross per 24 hour  Intake    683 ml  Output   4100 ml  Net  -3417 ml   Intake/Output: I/O last 3 completed shifts: In: 629 [P.O.:540; I.V.:23; IV Piggyback:66] Out: 2875 [Urine:2875]  Intake/Output this shift:  Total I/O In: 174 [P.O.:90; I.V.:18; IV Piggyback:66] Out: 1525 [Urine:1525] Weight change: 3.33 kg (7 lb 5.5 oz) Gen:WD obese AAF in NAD CVS:no rub Resp:cta [id number] +BS, soft, NT [id number] pretib edema   Recent Labs Lab 04/18/14 1209 04/18/14 2229 04/19/14 0439 04/19/14 0459 04/20/14 0523 04/21/14 0500  NA 133* 134* 135* 135* 139 138  K 4.5 4.4 4.2 4.3 4.5 4.6  CL 98 99 100 100 106 103  CO2 21 20 22 21  17* 21  GLUCOSE 373* 316* 363* 362* 170* 235*  BUN 34* 32* 35* 35* 44* 45*  CREATININE 3.14* 2.97* 3.31* 3.19* 3.42* 3.11*  ALBUMIN  --   --   --  2.6* 2.5* 2.5*  CALCIUM 8.6 8.6 8.2* 8.0* 8.2* 8.6  PHOS  --   --   --  4.9* 4.3 4.9*  AST  --   --   --  15  --   --   ALT  --   --   --  24  --   --    Liver Function Tests:  Recent Labs Lab 04/19/14 0459 04/20/14 0523 04/21/14 0500  AST 15  --   --   ALT 24  --   --   ALKPHOS 76  --   --   BILITOT <0.2*  --   --   PROT 6.6  --   --   ALBUMIN 2.6* 2.5* 2.5*   No results found for this basename: LIPASE, AMYLASE,  in the last 168 hours No results found for this basename: AMMONIA,  in the last 168 hours CBC:  Recent Labs Lab 04/18/14 1209 04/19/14 0439 04/20/14 0523 04/21/14 0500  WBC 5.4 5.1 6.5 6.7  NEUTROABS  --  2.8  --   --   HGB 8.5* 8.0* 8.5* 8.3*  HCT 26.9* 25.8* 27.2* 26.6*  MCV 92.1 92.8 94.1 93.3  PLT 247 222 215 216   Cardiac  Enzymes: No results found for this basename: CKTOTAL, CKMB, CKMBINDEX, TROPONINI,  in the last 168 hours CBG:  Recent Labs Lab 04/20/14 0654 04/20/14 1157 04/20/14 1654 04/20/14 2118 04/21/14 0659  GLUCAP 171* 220* 227* 135* 267*    Iron Studies:  Recent Labs  04/19/14 1630  IRON 34*  TIBC 283  FERRITIN 125   Studies/Results: No results found. [name] amLODipine  20 mg Oral QHS  . calcitRIOL  0.5 mcg Oral QHS  . darbepoetin (ARANESP) injection - NON-DIALYSIS  100 mcg Subcutaneous Q Fri-1800  . furosemide  160 mg Intravenous BID  . insulin aspart  0-15 Units Subcutaneous TID WC  . insulin aspart  0-5 Units Subcutaneous QHS  . insulin glargine  30 Units Subcutaneous QHS  . pregabalin  300 mg Oral QHS  . sodium chloride  3 mL Intravenous Q12H    BMET  Component Value Date/Time   NA 138 04/21/2014 0500   NA 137 10/16/2013   K 4.6 04/21/2014 0500   K 4.3 10/16/2013   CL 103 04/21/2014 0500   CL 100 10/16/2013   CO2 21 04/21/2014 0500   GLUCOSE 235* 04/21/2014 0500   BUN 45* 04/21/2014 0500   BUN 31* 10/16/2013   CREATININE 3.11* 04/21/2014 0500   CREATININE 2.25 10/16/2013   CALCIUM 8.6 04/21/2014 0500   CALCIUM 8.9 10/16/2013   GFRNONAA 17* 04/21/2014 0500   GFRNONAA 26 10/16/2013   GFRAA 20* 04/21/2014 0500   GFRAA 30 10/16/2013   CBC    Component Value Date/Time   WBC 6.7 04/21/2014 0500   RBC 2.85* 04/21/2014 0500   RBC  Value: 3.50 CORRECTED ON 09/21 AT 0959: PREVIOUSLY REPORTED AS 3.40* 09/15/2008 2033   HGB 8.3* 04/21/2014 0500   HCT 26.6* 04/21/2014 0500   PLT 216 04/21/2014 0500   MCV 93.3 04/21/2014 0500   MCH 29.1 04/21/2014 0500   MCHC 31.2 04/21/2014 0500   RDW 14.2 04/21/2014 0500   LYMPHSABS 1.9 04/19/2014 0439   MONOABS 0.4 04/19/2014 0439   EOSABS 0.1 04/19/2014 0439   BASOSABS 0.0 04/19/2014 0439    Assessment/Plan:  1. AKI/CKD- likely due to ischemic ATN in setting of escalating diuresis with concomitant increase in ACE-I and relative hypotension. [name]  without obstruction. Scr was 3.14 in January 2015 so not markedly off of his baseline. Likely acute on chronic CHF due to ischemia with an acute drop in Hgb in setting of escalating diuresis and ace-inhibition. Off ace and will need to evaluate EF. Possible cardiorenal syndrome.  1. Scr decreased after holding ACE and lasix but has increased slightly and she has had poor UOP but has since responded to restarting lasix.  2. Follow UOP and daily Scr 3. FeNa <1% but has evidence of overt volume overload 4. SPEP/UPEP pending. 5. Stressed the need for diabetes control (goal Hgb A1c <7%), BP control (goal <130/80), and weight loss (goal BMI <30 for renal transplant) 2. CHF- mainly diastolic by exam. Will need ECHO to evaluate EF as well as r/o pulmonary HTN as she has a high pre-test probability of having OSA  1. Restrict Na and fluid 2. Obtain sleep study  3. Cardiology eval if not already being followed by them.  3. Neuro- odd symptoms, possibly related to poorly controlled DM or possible MS. To f/u with Neuro as an outpt 4. HTN- BP relatively low. Cont to hold ace-I 5. ACDz- on procrit 30,000 units sq q3 weeks as an outpt but had a significant drop in Hgb over the last month  1. Guaiac stools 2. Check iron studies and for hemolysis 3. Give aranesp while an inpt 6. DM- poorly controlled. Changes to insulin per primary svc  1. Recommend dietician/nutritionist consult while an inpt 7. Morbid obesity- encouraged diet and weight loss 8. SHPTH- follow Ca/Phos/iPTH on calcitriol 9. Vascular access- pt with progressive CKD due to poorly controlled DM and HTN. Will need vein mapping and consideration for an AVF 10. Dispo- discussed renal transplant with pt and her husband.  She will need to change her lifestyle and take better care of her DM, HTN, and obesity before she can be evaluated for renal transplantation.  I also discussed with her to raise this issue at her next visit with Dr.  Jimmy Footman.   [place]

## 2014-04-21 NOTE — Progress Notes (Signed)
  Echocardiogram 2D Echocardiogram has been performed.  Valinda Hoar 04/21/2014, 9:18 AM

## 2014-04-22 DIAGNOSIS — N2581 Secondary hyperparathyroidism of renal origin: Secondary | ICD-10-CM

## 2014-04-22 LAB — GLUCOSE, CAPILLARY
GLUCOSE-CAPILLARY: 228 mg/dL — AB (ref 70–99)
GLUCOSE-CAPILLARY: 261 mg/dL — AB (ref 70–99)
Glucose-Capillary: 150 mg/dL — ABNORMAL HIGH (ref 70–99)
Glucose-Capillary: 148 mg/dL — ABNORMAL HIGH (ref 70–99)
Glucose-Capillary: 262 mg/dL — ABNORMAL HIGH (ref 70–99)

## 2014-04-22 LAB — RENAL FUNCTION PANEL
Albumin: 2.7 g/dL — ABNORMAL LOW (ref 3.5–5.2)
BUN: 50 mg/dL — ABNORMAL HIGH (ref 6–23)
CO2: 21 mEq/L (ref 19–32)
CREATININE: 3.13 mg/dL — AB (ref 0.50–1.10)
Calcium: 9.3 mg/dL (ref 8.4–10.5)
Chloride: 97 mEq/L (ref 96–112)
GFR calc non Af Amer: 17 mL/min — ABNORMAL LOW (ref >90)
GFR, EST AFRICAN AMERICAN: 20 mL/min — AB (ref >90)
GLUCOSE: 242 mg/dL — AB (ref 70–99)
PHOSPHORUS: 5.6 mg/dL — AB (ref 2.3–4.6)
Potassium: 5.2 mEq/L (ref 3.7–5.3)
SODIUM: 133 meq/L — AB (ref 137–147)

## 2014-04-22 MED ORDER — FUROSEMIDE 80 MG PO TABS
160.0000 mg | ORAL_TABLET | Freq: Two times a day (BID) | ORAL | Status: DC
  Administered 2014-04-22 – 2014-04-23 (×2): 160 mg via ORAL
  Filled 2014-04-22 (×4): qty 2

## 2014-04-22 NOTE — Progress Notes (Signed)
[facility] ROUNDING NOTE   Subjective:   Interval History:  Appears to be doing well   No complaints   Objective:  Vital signs in last 24 hours:  Temp:  [97.8 F (36.6 C)-98.4 F (36.9 C)] 97.8 F (36.6 C) (04/27 0700) Pulse Rate:  [88-102] 88 (04/27 0700) Resp:  [18-20] 18 (04/27 0700) BP: (119-136)/(72-83) 119/72 mmHg (04/27 0700) SpO2:  [96 %-100 %] 100 % (04/27 0700) Weight:  [115.2 kg (253 lb 15.5 oz)] 115.2 kg (253 lb 15.5 oz) (04/27 0500)  Weight change: -1.8 kg (-3 lb 15.5 oz) Filed Weights   04/20/14 0500 04/21/14 0500 04/22/14 0500  Weight: 113.67 kg (250 lb 9.6 oz) 117 kg (257 lb 15 oz) 115.2 kg (253 lb 15.5 oz)    Intake/Output: I/O last 3 completed shifts: In: 1400 [P.O.:1240; I.V.:28; IV Piggyback:132] Out: [id number] [Urine:7475]   Intake/Output this shift:  Total I/O In: 3 [I.V.:3] Out: 625 [Urine:625]  CVS- RRR RS- CTA ABD- BS present soft non-distended EXT- no edema   Basic Metabolic Panel:  Recent Labs Lab 04/19/14 0439 04/19/14 0459 04/20/14 0523 04/21/14 0500 04/22/14 0806  NA 135* 135* 139 138 133*  K 4.2 4.3 4.5 4.6 5.2  CL 100 100 106 103 97  CO2 22 21 17* 21 21  GLUCOSE 363* 362* 170* 235* 242*  BUN 35* 35* 44* 45* 50*  CREATININE 3.31* 3.19* 3.42* 3.11* 3.13*  CALCIUM 8.2* 8.0* 8.2* 8.6 9.3  MG  --  1.6  --   --   --   PHOS  --  4.9* 4.3 4.9* 5.6*    Liver Function Tests:  Recent Labs Lab 04/19/14 0459 04/20/14 0523 04/21/14 0500 04/22/14 0806  AST 15  --   --   --   ALT 24  --   --   --   ALKPHOS 76  --   --   --   BILITOT <0.2*  --   --   --   PROT 6.6  --   --   --   ALBUMIN 2.6* 2.5* 2.5* 2.7*   No results found for this basename: LIPASE, AMYLASE,  in the last 168 hours No results found for this basename: AMMONIA,  in the last 168 hours  CBC:  Recent Labs Lab 04/18/14 1209 04/19/14 0439 04/20/14 0523 04/21/14 0500  WBC 5.4 5.1 6.5 6.7  NEUTROABS  --  2.8  --   --   HGB 8.5* 8.0* 8.5* 8.3*   HCT 26.9* 25.8* 27.2* 26.6*  MCV 92.1 92.8 94.1 93.3  PLT 247 222 215 216    Cardiac Enzymes: No results found for this basename: CKTOTAL, CKMB, CKMBINDEX, TROPONINI,  in the last 168 hours  BNP: No components found with this basename: POCBNP,   CBG:  Recent Labs Lab 04/21/14 1141 04/21/14 1642 04/21/14 2144 04/22/14 0701 04/22/14 1141  GLUCAP 187* 219* 150* 228* 261*    Microbiology: Results for orders placed during the hospital encounter of 09/15/13  URINE CULTURE     Status: None   Collection Time    09/15/13  1:54 AM      Result Value Ref Range Status   Specimen Description URINE, CLEAN CATCH   Final   Special Requests Normal   Final   Culture  Setup Time     Final   Value: 09/15/2013 17:40     Performed at [facility]     Final   Value: 35,000 COLONIES/ML  Performed at [facility]     Final   Value: Multiple bacterial morphotypes present, none predominant. Suggest appropriate recollection if clinically indicated.     Performed at [facility]   Report Status 09/16/2013 FINAL   Final    Coagulation Studies: No results found for this basename: LABPROT, INR,  in the last 72 hours  Urinalysis: No results found for this basename: COLORURINE, APPERANCEUR, LABSPEC, PHURINE, GLUCOSEU, HGBUR, BILIRUBINUR, KETONESUR, PROTEINUR, UROBILINOGEN, NITRITE, LEUKOCYTESUR,  in the last 72 hours    Imaging: No results found.   Medications:     . amLODipine  20 mg Oral QHS  . calcitRIOL  0.5 mcg Oral QHS  . darbepoetin (ARANESP) injection - NON-DIALYSIS  100 mcg Subcutaneous Q Fri-1800  . furosemide  160 mg Oral BID  . insulin aspart  0-15 Units Subcutaneous TID WC  . insulin aspart  0-5 Units Subcutaneous QHS  . insulin glargine  30 Units Subcutaneous QHS  . pregabalin  300 mg Oral QHS  . sodium chloride  3 mL Intravenous Q12H   acetaminophen, acetaminophen, albuterol, cyclobenzaprine, morphine injection,  ondansetron (ZOFRAN) IV, ondansetron  Assessment/ Plan:  Heather [name] is an 43 y.o. female.  HPI: The patient is a 43 year old female who presents with chronic kidney disease. Heather [name] is a patient referred via Dr. 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 She was seen in our office on 04/16/14 and noted to have gained weight and  lower ext edema, fatigue, SOB, DOE. She denied cough or orthopnea. She takes  160mg  bid . She presented to [facility] on 04/18/14 with numbness and tingling of her face and left arm. She was admitted for further evaluation. We were asked to see the patient due to AKI/CKD when she was noted to have a Scr of 3.14 at time of admission. She started on Procrit for low Hgb of 8.9 and tsat 32% on 04/05/14.     [name] stable from a renal standpoint and could be followed up with her outpatient nephrologist   LOS: Kendall [place] @TODAY @12 :29 PM

## 2014-04-22 NOTE — Progress Notes (Signed)
[facility]                                                                              Patient Demographics  Heather [name], is a 43 y.o. female, DOB - Aug 03, 1971, [id number]  Admit date - 04/18/2014   Admitting Physician Robbie Lis, MD  Outpatient Primary MD for the patient is WEAVER, Allen Kell, NP  LOS - 4   Chief Complaint  Patient presents with  . Arm Pain  . Numbness        Assessment & Plan   Acute on chronic kidney disease stage IV -Creatinine currently 3.13, baseline is 2.3 -Likely due to ATN in the setting of diuresis, ACEI and hypotension (per nephrology) -Foley catheter in place to monitor intake and output -Nephrology consulted and appreciated -Renal ultrasound: No evidence of hydronephrosis. Unremarkable study. Bladder decompressed. -Echocardiogram: EF [id number], normal diastolic function -BNP [id number] -FeNa <0.9% -Continue lasix (160mg  IV BID), UO 6L in 24 hours -Spoke wit Dr. Justin Mend, will transition to oral lasix today, and monitor UOP -Follow up with nephrology in 2-3 weeks.  Left arm pain and numbness -CT of the head: No intracranial hemorrhage, mass effect or midline shift -MRI of the brain: No acute intracranial abnormality -Neurology was consulted and recommended outpatient neurology followup -improving   Diabetes mellitus -Uncontrolled -Hemoglobin A1c 8.6 -Last hemoglobin A1c in September 2014 of 9.9 -Patient is on NovoLog 7030 however this does not seem to be a good regimen. -Diabetes coordinator consulted and appreicated -Continue lantus, insulin sliding scale with CBG monitoring  Anemia of chronic disease -Secondary to chronic kidney disease -Current hemoglobin is stable -Will continue to monitor CBC -Patient has been receiving Procrit as an outpatient -Anemia panel: iron 34, TIBC 283, ferritin 125  Hypertension -Continue Norvasc and lasix -Lisinopril held due to to worsening kidney function  Diabetic neuropathy -Continue  Lyrica  Code Status: Full  Family Communication: None at bedside, husband via phone  Disposition Plan: Admitted, will transition to oral diuretics, and monitor UOP.  [place]ik[place] dc 4/28  Time Spent in minutes   25 minutes  Procedures  Renal Ultrasound: No evidence of hydronephrosis. Unremarkable study. Bladder decompressed.  Echocardiogram Study Conclusions  Left ventricle: The cavity size was normal. Systolic function was vigorous. The estimated ejection fraction was in the range of 65% to 70%. Wall motion was normal; there were no regional wall motion abnormalities. Left ventricular diastolic function parameters were normal.  Consults   Neurology Nephrology  DVT Prophylaxis  SCDs  Lab Results  Component Value Date   PLT 216 04/21/2014    Medications  Scheduled Meds: . amLODipine  20 mg Oral QHS  . calcitRIOL  0.5 mcg Oral QHS  . darbepoetin (ARANESP) injection - NON-DIALYSIS  100 mcg Subcutaneous Q Fri-1800  . furosemide  160 mg Intravenous BID  . insulin aspart  0-15 Units Subcutaneous TID WC  . insulin aspart  0-5 Units Subcutaneous QHS  . insulin glargine  30 Units Subcutaneous QHS  . pregabalin  300 mg Oral QHS  . sodium chloride  3 mL Intravenous Q12H   Continuous Infusions:   PRN Meds:.acetaminophen, acetaminophen, albuterol, cyclobenzaprine, morphine injection, ondansetron (ZOFRAN) IV,  ondansetron  Antibiotics    Anti-infectives   None      Subjective:   Dorris Singh seen and examined today.  Patient has no complaints this morning. Denies any chest pain, shortness of breath, abdominal pain.   Objective:   Filed Vitals:   04/21/14 1529 04/21/14 2147 04/22/14 0500 04/22/14 0700  BP: 136/83 123/83  119/72  Pulse: 102 89  88  Temp: 98.4 F (36.9 C) 97.9 F (36.6 C)  97.8 F (36.6 C)  TempSrc: Oral Oral  Oral  Resp: 20 18  18   Height:      Weight:   115.2 kg (253 lb 15.5 oz)   SpO2: 96% 96%  100%    Wt Readings from Last 3 Encounters:  04/22/14  115.2 kg (253 lb 15.5 oz)  09/20/13 109.317 kg (241 lb)  08/28/13 108.41 kg (239 lb)     Intake/Output Summary (Last 24 hours) at 04/22/14 [id number] Last data filed at 04/22/14 0731  Gross per 24 hour  Intake   1283 ml  Output   5275 ml  Net  -3992 ml    Exam  General: Well developed, well nourished, NAD, appears stated age  HEENT: NCAT, mucous membranes moist.   Neck: Supple, no JVD, no masses  Cardiovascular: S1 S2 auscultated, no rubs, murmurs or gallops. Regular rate and rhythm.  Respiratory: Clear to auscultation bilaterally with equal chest rise  Abdomen: Soft, nontender, nondistended, + bowel sounds  Extremities: warm dry without cyanosis clubbing, +1 pitting edema in her extremities bilaterally  Neuro: AAOx3, No focal deficits  Skin: Without rashes exudates or nodules  Psych: Normal affect and demeanor   Data Review   Micro Results No results found for this or any previous visit (from the past 240 hour(s)).  Radiology Reports Dg Chest 2 View  04/18/2014   CLINICAL DATA:  Left arm pain and numbness  EXAM: CHEST  2 VIEW  COMPARISON:  02/04/2014  FINDINGS: Thoracolumbar scoliosis and spondylosis. Ill-defined density at the intersection of the right second and sixth ribs on the frontal projection, not well corroborated on the lateral projection.  Cardiac and mediastinal margins appear normal. No pleural effusion identified.  IMPRESSION: 1. Nonspecific ill-defined density at the intersection of the right second and sixth ribs -although conceivably a summation shadow, I cannot exclude early pneumonia. This area appeared clear on 04/03/2013 and accordingly is not thought to be a neoplastic pulmonary nodule.   Electronically Signed   By: Sherryl Barters M.D.   On: 04/18/2014 12:45   Ct Head Wo Contrast  04/18/2014   CLINICAL DATA:  Headache, left side numbness  EXAM: CT HEAD WITHOUT CONTRAST  TECHNIQUE: Contiguous axial images were obtained from the base of the skull through  the vertex without intravenous contrast.  COMPARISON:  05/29/2011  FINDINGS: No skull fracture is noted. Paranasal sinuses and mastoid air cells are unremarkable. No intracranial hemorrhage, mass effect or midline shift.  No intraventricular hemorrhage. Ventricular size is stable from prior exam.  Subtle mild subcortical white matter decreased attenuation may be due to chronic small vessel ischemic changes. No definite acute cortical infarction. No mass lesion is noted on this unenhanced scan.  IMPRESSION: No intracranial hemorrhage, mass effect or midline shift.Subtle mild subcortical white matter decreased attenuation may be due to chronic small vessel ischemic changes. No definite acute cortical infarction.   Electronically Signed   By: Lahoma Crocker M.D.   On: 04/18/2014 15:47   Mr Brain Wo Contrast  04/18/2014  CLINICAL DATA:  43 year old female with upper extremity numbness and pain radiating to the left side of the body. Left side facial numbness. Initial encounter.  EXAM: MRI HEAD WITHOUT CONTRAST  TECHNIQUE: Multiplanar, multiecho pulse sequences of the brain and surrounding structures were obtained without intravenous contrast.  COMPARISON:  Head CTs without contrast 04/18/2014 and earlier.  FINDINGS: No restricted diffusion to suggest acute infarction. No midline shift, mass effect, evidence of mass lesion, ventriculomegaly, extra-axial collection or acute intracranial hemorrhage. Cervicomedullary junction and pituitary are within normal limits. Negative visualized cervical spine. Major intracranial vascular flow voids are preserved.  Scattered small cerebral white matter T2 and FLAIR hyperintense foci. Central and subcortical white matter predominantly affected. The pattern is nonspecific. The extent is moderate for age. No cortical encephalomalacia. No temporal lobe involvement identified. Deep gray matter nuclei, brainstem and cerebellum are within normal limits.  Visible internal auditory structures  appear normal. Mastoids are clear. Occasional small mucous retention cysts in the paranasal sinuses. Visualized orbit soft tissues are within normal limits. Visualized bone marrow signal is within normal limits. Visualized scalp soft tissues are within normal limits.  IMPRESSION: 1.  No acute intracranial abnormality. 2. Moderate for age nonspecific cerebral white matter signal changes. Differential considerations include accelerated small vessel ischemia, sequelae of prior trauma, hypercoagulable state, vasculitis, migraines, prior infection or demyelination.   Electronically Signed   By: Lars Pinks M.D.   On: 04/18/2014 19:14    CBC  Recent Labs Lab 04/18/14 1209 04/19/14 0439 04/20/14 0523 04/21/14 0500  WBC 5.4 5.1 6.5 6.7  HGB 8.5* 8.0* 8.5* 8.3*  HCT 26.9* 25.8* 27.2* 26.6*  PLT 247 222 215 216  MCV 92.1 92.8 94.1 93.3  MCH 29.1 28.8 29.4 29.1  MCHC 31.6 31.0 31.3 31.2  RDW 14.5 14.4 14.3 14.2  LYMPHSABS  --  1.9  --   --   MONOABS  --  0.4  --   --   EOSABS  --  0.1  --   --   BASOSABS  --  0.0  --   --     Chemistries   Recent Labs Lab 04/18/14 2229 04/19/14 0439 04/19/14 0459 04/20/14 0523 04/21/14 0500  NA 134* 135* 135* 139 138  K 4.4 4.2 4.3 4.5 4.6  CL 99 100 100 106 103  CO2 20 22 21  17* 21  GLUCOSE 316* 363* 362* 170* 235*  BUN 32* 35* 35* 44* 45*  CREATININE 2.97* 3.31* 3.19* 3.42* 3.11*  CALCIUM 8.6 8.2* 8.0* 8.2* 8.6  MG  --   --  1.6  --   --   AST  --   --  15  --   --   ALT  --   --  24  --   --   ALKPHOS  --   --  76  --   --   BILITOT  --   --  <0.2*  --   --    ------------------------------------------------------------------------------------------------------------------ estimated creatinine clearance is 31.3 ml/min (by C-G formula based on Cr of 3.11). ------------------------------------------------------------------------------------------------------------------ No results found for this basename: HGBA1C,  in the last 72  hours ------------------------------------------------------------------------------------------------------------------ No results found for this basename: CHOL, HDL, LDLCALC, TRIG, CHOLHDL, LDLDIRECT,  in the last 72 hours ------------------------------------------------------------------------------------------------------------------ No results found for this basename: TSH, T4TOTAL, FREET3, T3FREE, THYROIDAB,  in the last 72 hours ------------------------------------------------------------------------------------------------------------------  Recent Labs  04/19/14 1630  FERRITIN 125  TIBC 283  IRON 34*    Coagulation profile  Recent  Labs Lab 04/19/14 0459  INR 1.00    No results found for this basename: DDIMER,  in the last 72 hours  Cardiac Enzymes No results found for this basename: CK, CKMB, TROPONINI, MYOGLOBIN,  in the last 168 hours ------------------------------------------------------------------------------------------------------------------ No components found with this basename: POCBNP,     Kayline Sheer D.O. on 04/22/2014 at 8:22 AM  Between 7am to 7pm - Pager - [phone]  After 7pm go to [email or web address] - password [email or web address]  And look for the night coverage person covering for me after hours  [facility] Office  [phone]

## 2014-04-22 NOTE — Progress Notes (Signed)
Inpatient Diabetes Program Recommendations  AACE/ADA: New Consensus Statement on Inpatient Glycemic Control (2013)  Target Ranges:  Prepandial:   less than 140 mg/dL      Peak postprandial:   less than 180 mg/dL (1-2 hours)      Critically ill patients:  140 - 180 mg/dL  Results for Heather [name], Heather [name] (MRN [id number]) as of 04/22/2014 12:07  Ref. Range 04/21/2014 11:41 04/21/2014 16:42 04/21/2014 21:44 04/22/2014 07:01 04/22/2014 11:41  Glucose-Capillary Latest Range: 70-99 mg/dL 187 (H) 219 (H) 150 (H) 228 (H) 261 (H)   Inpatient Diabetes Program Recommendations Insulin - Basal: consider increase Lantus to 40 units  Thank you  Raoul Pitch BSN, RN,CDE Inpatient Diabetes Coordinator [phone] (team pager)

## 2014-04-22 NOTE — Progress Notes (Signed)
UR COMPLETED  

## 2014-04-23 LAB — UIFE/LIGHT CHAINS/TP QN, 24-HR UR
Albumin, U: DETECTED
Alpha 1, Urine: DETECTED — AB
Alpha 2, Urine: DETECTED — AB
BETA UR: DETECTED — AB
FREE LAMBDA LT CHAINS, UR: 3.01 mg/dL — AB (ref 0.02–0.67)
Free Kappa Lt Chains,Ur: 19.8 mg/dL — ABNORMAL HIGH (ref 0.14–2.42)
Free Kappa/Lambda Ratio: 6.58 ratio (ref 2.04–10.37)
GAMMA UR: DETECTED — AB
Total Protein, Urine: 91.7 mg/dL

## 2014-04-23 LAB — RENAL FUNCTION PANEL
Albumin: 2.5 g/dL — ABNORMAL LOW (ref 3.5–5.2)
BUN: 54 mg/dL — ABNORMAL HIGH (ref 6–23)
CO2: 26 meq/L (ref 19–32)
CREATININE: 3.48 mg/dL — AB (ref 0.50–1.10)
Calcium: 9.4 mg/dL (ref 8.4–10.5)
Chloride: 96 mEq/L (ref 96–112)
GFR calc Af Amer: 17 mL/min — ABNORMAL LOW (ref >90)
GFR calc non Af Amer: 15 mL/min — ABNORMAL LOW (ref >90)
GLUCOSE: 236 mg/dL — AB (ref 70–99)
Phosphorus: 6.1 mg/dL — ABNORMAL HIGH (ref 2.3–4.6)
Potassium: 4.3 mEq/L (ref 3.7–5.3)
Sodium: 137 mEq/L (ref 137–147)

## 2014-04-23 LAB — PROTEIN ELECTROPHORESIS, SERUM
ALPHA-1-GLOBULIN: 4.5 % (ref 2.9–4.9)
Albumin ELP: 49.1 % — ABNORMAL LOW (ref 55.8–66.1)
Alpha-2-Globulin: 10.7 % (ref 7.1–11.8)
Beta 2: 7.2 % — ABNORMAL HIGH (ref 3.2–6.5)
Beta Globulin: 7 % (ref 4.7–7.2)
GAMMA GLOBULIN: 21.5 % — AB (ref 11.1–18.8)
M-SPIKE, %: NOT DETECTED g/dL
TOTAL PROTEIN ELP: 6.7 g/dL (ref 6.0–8.3)

## 2014-04-23 LAB — GLUCOSE, CAPILLARY
GLUCOSE-CAPILLARY: 214 mg/dL — AB (ref 70–99)
Glucose-Capillary: 271 mg/dL — ABNORMAL HIGH (ref 70–99)

## 2014-04-23 MED ORDER — FUROSEMIDE 80 MG PO TABS: 160.0000 mg | ORAL_TABLET | Freq: Two times a day (BID) | ORAL | Status: DC

## 2014-04-23 MED ORDER — FLUCONAZOLE 150 MG PO TABS: 150.0000 mg | ORAL_TABLET | Freq: Every day | ORAL | Status: DC

## 2014-04-23 MED ORDER — CLOTRIMAZOLE 1 % VA CREA
1.0000 | TOPICAL_CREAM | Freq: Every day | VAGINAL | Status: DC
  Administered 2014-04-23: 1 via VAGINAL
  Filled 2014-04-23: qty 45

## 2014-04-23 MED ORDER — INSULIN ASPART 100 UNIT/ML ~~LOC~~ SOLN: 0.0000 [IU] | Freq: Three times a day (TID) | SUBCUTANEOUS | Status: DC

## 2014-04-23 MED ORDER — INSULIN GLARGINE 100 UNIT/ML ~~LOC~~ SOLN: 30.0000 [IU] | Freq: Every day | SUBCUTANEOUS | Status: DC

## 2014-04-23 NOTE — Discharge Instructions (Signed)
Acute Kidney Injury Acute kidney injury is a disease in which there is sudden (acute) damage to the kidneys. The kidneys are 2 organs that lie on either side of the spine between the middle of the back and the front of the abdomen. The kidneys:  Remove wastes and extra water from the blood.   Produce important hormones. These help keep bones strong, regulate blood pressure, and help create red blood cells.   Balance the fluids and chemicals in the blood and tissues. A small amount of kidney damage may not cause problems, but a large amount of damage may make it difficult or impossible for the kidneys to work the way they should. Acute kidney injury may develop into long-lasting (chronic) kidney disease. It may also develop into a life-threatening disease called end-stage kidney disease. Acute kidney injury can get worse very quickly, so it should be treated right away. Early treatment may prevent other kidney diseases from developing.  CAUSES   A problem with blood flow to the kidneys. This may be caused by:   Blood loss.   Heart disease.   Severe burns.   Liver disease.  Direct damage to the kidneys. This may be caused by:  Some medicines.   A kidney infection.   Poisoning or consuming toxic substances.   A surgical wound.   A blow to the kidney area.   A problem with urine flow. This may be caused by:   Cancer.   Kidney stones.   An enlarged prostate. SYMPTOMS   Swelling (edema) of the legs, ankles, or feet.   Tiredness (lethargy).   Nausea or vomiting.   Confusion.   Problems with urination, such as:   Painful or burning feeling during urination.   Decreased urine production.   Frequent accidents in children who are potty trained.   Bloody urine.   Muscle twitches and cramps.   Shortness of breath.   Seizures.   Chest pain or pressure. Sometimes, no symptoms are present. DIAGNOSIS Acute kidney injury may be detected  and diagnosed by tests, including blood, urine, imaging, or kidney biopsy tests.  TREATMENT Treatment of acute kidney injury varies depending on the cause and severity of the kidney damage. In mild cases, no treatment may be needed. The kidneys may heal on their own. If acute kidney injury is more severe, your caregiver will treat the cause of the kidney damage, help the kidneys heal, and prevent complications from occurring. Severe cases may require a procedure to remove toxic wastes from the body (dialysis) or surgery to repair kidney damage. Surgery may involve:   Repair of a torn kidney.   Removal of an obstruction. Most of the time, you will need to stay overnight at the hospital.  HOME CARE INSTRUCTIONS:  Follow your prescribed diet.  Only take over-the-counter or prescription medicines as directed by your caregiver.  Do not take any new medicines (prescription, over-the-counter, or nutritional supplements) unless approved by your caregiver. Many medicines can worsen your kidney damage or need to have the dose adjusted.   Keep all follow-up appointments as directed by your caregiver.  Observe your condition to make sure you are healing as expected. SEEK IMMEDIATE MEDICAL CARE IF:  You are feeling ill or have severe pain in the back or side.   Your symptoms return or you have new symptoms.  You have any symptoms of end-stage kidney disease. These include:   Persistent itchiness.   Loss of appetite.   Headaches.   Abnormally dark  or light skin.  Numbness in the hands or feet.   Easy bruising.   Frequent hiccups.   Menstruation stops.   You have a fever.  You have increased urine production.  You have pain or bleeding when urinating. MAKE SURE YOU:   Understand these instructions.  Will watch your condition.  Will get help right away if you are not doing well or get worse Document Released: 06/28/2011 Document Revised: 04/09/2013 Document  Reviewed: 08/11/2012 [facility] Patient Information 2014 [place].

## 2014-04-23 NOTE — Discharge Summary (Signed)
Physician Discharge Summary  Heather [name] [id number] DOB: 10/04/1971 DOA: 04/18/2014  PCP: Irene Pap, NP  Admit date: 04/18/2014 Discharge date: 04/23/2014  Time spent: 45 minutes  Recommendations for Outpatient Follow-up:  Patient will be discharged home. She should follow up with her primary care physician within one of discharge. Patient will also need follow up with Dr. Jimmy Footman within 2 weeks. Patient should also followup with [facility] in one month. Patient should continue her medications as prescribed. Patient will need her renal function followed and monitored.   Discharge Diagnoses:  Acute on chronic kidney disease, stage IV Left arm pain and numbness Diabetes mellitus, uncontrolled Anemia of chronic disease Hypertension Diabetic neuropathy  Discharge Condition: Stable  Diet recommendation: Carb modified, heart healthy  Filed Weights   04/21/14 0500 04/22/14 0500 04/23/14 0500  Weight: 117 kg (257 lb 15 oz) 115.2 kg (253 lb 15.5 oz) 111.449 kg (245 lb 11.2 oz)    History of present illness:  43 year old female with past medical history of hypertension, uncontrolled diabetes, diabetic neuropathy, CKD who presented to [facility] ED 04/18/2014 with complaints of worsening left arm numbness and pain for past 24 hours prior to this admission. Patient reported pain has woken her from sleep and then she felt left facial numbness. Patient also felt slightly lightheaded but there was no falls. No slurred speach and no weakness or numbness in legs. No reports of fever or chills. No shortness of breath or palpitations. No abdominal pain, nausea or vomiting. No blood in stool or urine.  In ED, vitals are stable, BP was 122/75, HR 84, Tmax 98.2 F and oxygen saturation was 97% on room air. Blood work revealed hemoglobin of 8.5, creatinine of 3.14 (baseline 2.34 in [id number]). CT head and MRI brain showed chronic ischemic changes which per neurology was most likely  related to poorly controlled diabetes. Her chest xray showed nonspecific ill-defined density at the intersection of the right second and sixth ribs possibly pneumonia but since pt did not have fevers, cough or elevated WBC count we did not start antibiotics on admission. She was given 1 L NS in ED but has not voided much. Her bladder scan revealed 470 cc of retained urine. Order was placed for foley insertion. She was admitted for further evaluation and management.  Hospital Course:  Acute on chronic kidney disease stage IV  -Creatinine currently 3.13, baseline is 2.3  -Likely due to ATN in the setting of diuresis, ACEI and hypotension (per nephrology)  -Foley catheter in place to monitor intake and output  -Nephrology consulted and appreciated  -Renal ultrasound: No evidence of hydronephrosis. Unremarkable study. Bladder decompressed.  -Echocardiogram: EF [id number], normal diastolic function  -BNP [id number]  -FeNa <0.9%  -Continue lasix (160mg  IV BID), UO 6L in 24 hours  -Spoke wit Dr. Justin Mend, will transition to oral lasix today, and monitor UOP  -Follow up with nephrology in 2 weeks  Left arm pain and numbness  -CT of the head: No intracranial hemorrhage, mass effect or midline shift  -MRI of the brain: No acute intracranial abnormality  -Neurology was consulted and recommended outpatient neurology followup  -improving   Diabetes mellitus  -Uncontrolled  -Hemoglobin A1c 8.6  -Last hemoglobin A1c in September 2014 of 9.9  -Patient is on NovoLog 7030 however this does not seem to be a good regimen.  -Diabetes coordinator consulted and appreicated  -Continue lantus, insulin sliding scale with CBG monitoring   Anemia of chronic disease  -Secondary to  chronic kidney disease  -Current hemoglobin is stable  -Will continue to monitor CBC  -Patient has been receiving Procrit as an outpatient  -Anemia panel: iron 34, TIBC 283, ferritin 125   Hypertension  -Continue Norvasc and lasix  -Lisinopril  held due to to worsening kidney function   Diabetic neuropathy  -Continue Lyrica  Procedures: Renal Ultrasound: No evidence of hydronephrosis. Unremarkable study. Bladder decompressed.   Echocardiogram  Study Conclusions  Left ventricle: The cavity size was normal. Systolic function was vigorous. The estimated ejection fraction was in the range of 65% to 70%. Wall motion was normal; there were no regional wall motion abnormalities. Left ventricular diastolic function parameters were normal.  Consultations: Nephrology Neurology  Discharge Exam: Filed Vitals:   04/23/14 0531  BP: 100/65  Pulse: 87  Temp: 98.1 F (36.7 C)  Resp: 18   Exam  General: Well developed, well nourished, NAD, appears stated age  HEENT: NCAT, mucous membranes moist.  Neck: Supple, no JVD, no masses  Cardiovascular: S1 S2 auscultated, no rubs, murmurs or gallops. Regular rate and rhythm.  Respiratory: Clear to auscultation bilaterally with equal chest rise  Abdomen: Soft, nontender, nondistended, + bowel sounds  Extremities: warm dry without cyanosis clubbing, +1 pitting edema in her extremities bilaterally  Neuro: AAOx3, No focal deficits  Skin: Without rashes exudates or nodules  Psych: Normal affect and demeanor   Discharge Instructions      Discharge Orders   Future Appointments Provider Department Dept Phone   08/28/2014 10:00 AM Shelly Bombard, MD [facility] [phone]   Future Orders Complete By Expires   Diet - low sodium heart healthy  As directed    Diet Carb Modified  As directed    Discharge instructions  As directed    Increase activity slowly  As directed        Medication List    STOP taking these medications       lisinopril 10 MG tablet  Commonly known as:  PRINIVIL,ZESTRIL     NOVOLIN 70/30 RELION (70-30) 100 UNIT/ML injection  Generic drug:  insulin NPH-regular Human      TAKE these medications       ACCU-CHEK SMARTVIEW test strip  Generic drug:   glucose blood     albuterol 108 (90 BASE) MCG/ACT inhaler  Commonly known as:  PROVENTIL HFA;VENTOLIN HFA  Inhale 1-2 puffs into the lungs every 6 (six) hours as needed for wheezing or shortness of breath.     amLODipine 10 MG tablet  Commonly known as:  NORVASC  Take 20 mg by mouth at bedtime.     calcitRIOL 0.25 MCG capsule  Commonly known as:  ROCALTROL  Take 0.5 mcg by mouth at bedtime.     cyclobenzaprine 10 MG tablet  Commonly known as:  FLEXERIL  Take 1 tablet (10 mg total) by mouth 3 (three) times daily as needed for muscle spasms.     fluconazole 150 MG tablet  Commonly known as:  DIFLUCAN  Take 1 tablet (150 mg total) by mouth daily.     furosemide 80 MG tablet  Commonly known as:  LASIX  Take 2 tablets (160 mg total) by mouth 2 (two) times daily.     insulin aspart 100 UNIT/ML injection  Commonly known as:  novoLOG  Inject 0-15 Units into the skin 3 (three) times daily with meals.     insulin glargine 100 UNIT/ML injection  Commonly known as:  LANTUS  Inject 0.3 mLs (30 Units  total) into the skin at bedtime.     pregabalin 150 MG capsule  Commonly known as:  LYRICA  Take 300 mg by mouth at bedtime.       Allergies  Allergen Reactions  . Ibuprofen     CKD stage 3. Should avoid.  . Dilaudid [Hydromorphone Hcl] Itching   Follow-up Information   Follow up with WEAVER, LAYNE C, NP. Schedule an appointment as soon as possible for a visit in 1 week. [facility] followup)    Specialty:  Nurse Practitioner   Contact information:   [id number] [place] [address] [phone]       Follow up with DETERDING,JAMES L, MD. Schedule an appointment as soon as possible for a visit in 2 weeks. [facility] followup)    Specialty:  Nephrology   Contact information:   [address] [place] [address] [phone]       Follow up with [facility]. Schedule an appointment as soon as possible for a visit in 1 month. [facility] followup)     Specialty:  Neurology   Contact information:   [address] [place] [address] [phone]       The results of significant diagnostics from this hospitalization (including imaging, microbiology, ancillary and laboratory) are listed below for reference.    Significant Diagnostic Studies: Dg Chest 2 View  04/18/2014   CLINICAL DATA:  Left arm pain and numbness  EXAM: CHEST  2 VIEW  COMPARISON:  02/04/2014  FINDINGS: Thoracolumbar scoliosis and spondylosis. Ill-defined density at the intersection of the right second and sixth ribs on the frontal projection, not well corroborated on the lateral projection.  Cardiac and mediastinal margins appear normal. No pleural effusion identified.  IMPRESSION: 1. Nonspecific ill-defined density at the intersection of the right second and sixth ribs -although conceivably a summation shadow, I cannot exclude early pneumonia. This area appeared clear on 04/03/2013 and accordingly is not thought to be a neoplastic pulmonary nodule.   Electronically Signed   By: Sherryl Barters M.D.   On: 04/18/2014 12:45   Ct Head Wo Contrast  04/18/2014   CLINICAL DATA:  Headache, left side numbness  EXAM: CT HEAD WITHOUT CONTRAST  TECHNIQUE: Contiguous axial images were obtained from the base of the skull through the vertex without intravenous contrast.  COMPARISON:  05/29/2011  FINDINGS: No skull fracture is noted. Paranasal sinuses and mastoid air cells are unremarkable. No intracranial hemorrhage, mass effect or midline shift.  No intraventricular hemorrhage. Ventricular size is stable from prior exam.  Subtle mild subcortical white matter decreased attenuation may be due to chronic small vessel ischemic changes. No definite acute cortical infarction. No mass lesion is noted on this unenhanced scan.  IMPRESSION: No intracranial hemorrhage, mass effect or midline shift.Subtle mild subcortical white matter decreased attenuation may be due to chronic small vessel  ischemic changes. No definite acute cortical infarction.   Electronically Signed   By: Lahoma Crocker M.D.   On: 04/18/2014 15:47   Mr Brain Wo Contrast  04/18/2014   CLINICAL DATA:  43 year old female with upper extremity numbness and pain radiating to the left side of the body. Left side facial numbness. Initial encounter.  EXAM: MRI HEAD WITHOUT CONTRAST  TECHNIQUE: Multiplanar, multiecho pulse sequences of the brain and surrounding structures were obtained without intravenous contrast.  COMPARISON:  Head CTs without contrast 04/18/2014 and earlier.  FINDINGS: No restricted diffusion to suggest acute infarction. No midline shift, mass effect, evidence of  mass lesion, ventriculomegaly, extra-axial collection or acute intracranial hemorrhage. Cervicomedullary junction and pituitary are within normal limits. Negative visualized cervical spine. Major intracranial vascular flow voids are preserved.  Scattered small cerebral white matter T2 and FLAIR hyperintense foci. Central and subcortical white matter predominantly affected. The pattern is nonspecific. The extent is moderate for age. No cortical encephalomalacia. No temporal lobe involvement identified. Deep gray matter nuclei, brainstem and cerebellum are within normal limits.  Visible internal auditory structures appear normal. Mastoids are clear. Occasional small mucous retention cysts in the paranasal sinuses. Visualized orbit soft tissues are within normal limits. Visualized bone marrow signal is within normal limits. Visualized scalp soft tissues are within normal limits.  IMPRESSION: 1.  No acute intracranial abnormality. 2. Moderate for age nonspecific cerebral white matter signal changes. Differential considerations include accelerated small vessel ischemia, sequelae of prior trauma, hypercoagulable state, vasculitis, migraines, prior infection or demyelination.   Electronically Signed   By: Lars Pinks M.D.   On: 04/18/2014 19:14   US Renal  04/19/2014    CLINICAL DATA:  Elevated creatinine.  EXAM: RENAL/URINARY TRACT ULTRASOUND COMPLETE  COMPARISON:  01/15/2013  FINDINGS: Right Kidney:  Length: 10.5 cm. Echogenicity within normal limits. No mass or hydronephrosis visualized.  Left Kidney:  Length: 10.1 cm. Echogenicity within normal limits. No mass or hydronephrosis visualized.  Bladder:  Foley catheter in place, decompressed and not visualized.  IMPRESSION: No evidence of hydronephrosis. Unremarkable study. Bladder decompressed.   Electronically Signed   By: Rolm Baptise M.D.   On: 04/19/2014 10:45    Microbiology: No results found for this or any previous visit (from the past 240 hour(s)).   Labs: Basic Metabolic Panel:  Recent Labs Lab 04/19/14 0459 04/20/14 0523 04/21/14 0500 04/22/14 0806 04/23/14 0424  NA 135* 139 138 133* 137  K 4.3 4.5 4.6 5.2 4.3  CL 100 106 103 97 96  CO2 21 17* 21 21 26   GLUCOSE 362* 170* 235* 242* 236*  BUN 35* 44* 45* 50* 54*  CREATININE 3.19* 3.42* 3.11* 3.13* 3.48*  CALCIUM 8.0* 8.2* 8.6 9.3 9.4  MG 1.6  --   --   --   --   PHOS 4.9* 4.3 4.9* 5.6* 6.1*   Liver Function Tests:  Recent Labs Lab 04/19/14 0459 04/20/14 0523 04/21/14 0500 04/22/14 0806 04/23/14 0424  AST 15  --   --   --   --   ALT 24  --   --   --   --   ALKPHOS 76  --   --   --   --   BILITOT <0.2*  --   --   --   --   PROT 6.6  --   --   --   --   ALBUMIN 2.6* 2.5* 2.5* 2.7* 2.5*   No results found for this basename: LIPASE, AMYLASE,  in the last 168 hours No results found for this basename: AMMONIA,  in the last 168 hours CBC:  Recent Labs Lab 04/18/14 1209 04/19/14 0439 04/20/14 0523 04/21/14 0500  WBC 5.4 5.1 6.5 6.7  NEUTROABS  --  2.8  --   --   HGB 8.5* 8.0* 8.5* 8.3*  HCT 26.9* 25.8* 27.2* 26.6*  MCV 92.1 92.8 94.1 93.3  PLT 247 222 215 216   Cardiac Enzymes: No results found for this basename: CKTOTAL, CKMB, CKMBINDEX, TROPONINI,  in the last 168 hours BNP: BNP (last 3 results)  Recent Labs   09/15/13 0149 02/04/14 1333  04/21/14 1227  PROBNP 62.5 192.7* 120.4   CBG:  Recent Labs Lab 04/22/14 0701 04/22/14 1141 04/22/14 1641 04/22/14 2106 04/23/14 0605  GLUCAP 228* 261* 148* 262* 214*       Signed:  Marikay Roads  [facility] 04/23/2014, 10:23 AM

## 2014-04-25 ENCOUNTER — Ambulatory Visit: Payer: Managed Care, Other (non HMO) | Admitting: Nurse Practitioner

## 2014-04-26 ENCOUNTER — Encounter (HOSPITAL_COMMUNITY): Payer: Managed Care, Other (non HMO)

## 2014-04-26 ENCOUNTER — Telehealth: Payer: Self-pay | Admitting: Nurse Practitioner

## 2014-04-26 ENCOUNTER — Ambulatory Visit (INDEPENDENT_AMBULATORY_CARE_PROVIDER_SITE_OTHER): Payer: Managed Care, Other (non HMO) | Admitting: Nurse Practitioner

## 2014-04-26 ENCOUNTER — Encounter: Payer: Self-pay | Admitting: *Deleted

## 2014-04-26 ENCOUNTER — Encounter: Payer: Self-pay | Admitting: Nurse Practitioner

## 2014-04-26 VITALS — BP 120/76 | HR 96 | Temp 98.2°F | Ht 68.17 in | Wt 247.0 lb

## 2014-04-26 DIAGNOSIS — Z23 Encounter for immunization: Secondary | ICD-10-CM

## 2014-04-26 DIAGNOSIS — E11621 Type 2 diabetes mellitus with foot ulcer: Secondary | ICD-10-CM

## 2014-04-26 DIAGNOSIS — K3184 Gastroparesis: Secondary | ICD-10-CM

## 2014-04-26 DIAGNOSIS — N186 End stage renal disease: Secondary | ICD-10-CM | POA: Insufficient documentation

## 2014-04-26 DIAGNOSIS — N185 Chronic kidney disease, stage 5: Secondary | ICD-10-CM | POA: Insufficient documentation

## 2014-04-26 DIAGNOSIS — N189 Chronic kidney disease, unspecified: Secondary | ICD-10-CM

## 2014-04-26 DIAGNOSIS — N039 Chronic nephritic syndrome with unspecified morphologic changes: Secondary | ICD-10-CM

## 2014-04-26 DIAGNOSIS — N184 Chronic kidney disease, stage 4 (severe): Secondary | ICD-10-CM

## 2014-04-26 DIAGNOSIS — E119 Type 2 diabetes mellitus without complications: Secondary | ICD-10-CM

## 2014-04-26 DIAGNOSIS — L97509 Non-pressure chronic ulcer of other part of unspecified foot with unspecified severity: Secondary | ICD-10-CM

## 2014-04-26 DIAGNOSIS — E1169 Type 2 diabetes mellitus with other specified complication: Secondary | ICD-10-CM

## 2014-04-26 DIAGNOSIS — I10 Essential (primary) hypertension: Secondary | ICD-10-CM

## 2014-04-26 DIAGNOSIS — D631 Anemia in chronic kidney disease: Secondary | ICD-10-CM

## 2014-04-26 LAB — LIPID PANEL
Cholesterol: 262 mg/dL — ABNORMAL HIGH (ref 0–200)
HDL: 44.9 mg/dL (ref >39.00)
LDL CALC: 150 mg/dL — AB (ref 0–99)
Total CHOL/HDL Ratio: 6
Triglycerides: 335 mg/dL — ABNORMAL HIGH (ref 0.0–149.0)
VLDL: 67 mg/dL — AB (ref 0.0–40.0)

## 2014-04-26 LAB — VITAMIN B12: VITAMIN B 12: 402 pg/mL (ref 211–911)

## 2014-04-26 NOTE — Assessment & Plan Note (Signed)
Daily exercise. Eat fresh pear daily. Miralax 1 capful in 4 oz water twice daily. F/u 2 weeks.

## 2014-04-26 NOTE — Assessment & Plan Note (Signed)
Recent hospitalization for stroke-like symptoms. Stroke r/o. Symptoms thought to be r/t neuropathy. Med change in hospital:  Stop 70/30. Start lantus 40u qd, SSI. Not using SSI properly. She checked a few sugars , but did not give self insulin. Asked why-"i don't know." Educated on importance of her involvement for SSI to work. [name] concerned about cost of strips. Pt will try to use SSI for 2 weeks, then re-evaluate. Gave sugar log: instructed check 3 times day 30-1 hr after meals, 3 fasting sugars and 3 pm sugars before she returns.  Went over how to read labels, gave hand-out.  Advised cut out refined sugar for diabetes control & weight loss. Foot exam-has sore top of L foot. Cleaned & dressed. Reinforced routine foot care. Eye exam last fall. Admin pneumococcal vaccine today.

## 2014-04-26 NOTE — Progress Notes (Signed)
Pre visit review using our clinic review tool, if applicable. No additional management support is needed unless otherwise documented below in the visit note. 

## 2014-04-26 NOTE — Assessment & Plan Note (Signed)
R heel if nearly healed, dry, pink.1cm L foot : yellow, dry, crust. 1 cm. Cleaned w/saline & peroxide. Dried. Applied bacitrtacin & band-aid. Gave home instructions. F/u 2 weeks.

## 2014-04-26 NOTE — Assessment & Plan Note (Signed)
End organ damage: CKD stage 4 Hold lisinopril until BP check at Dr Deterding's ofc. Dr Deterding has been managing HTN. Advised salt restriction to 2400 mg. Educated on reading labels. Daily walk. Encourage Weight loss.

## 2014-04-26 NOTE — Assessment & Plan Note (Signed)
guiac today-NEG Check B12. Continue to follow nephrology.

## 2014-04-26 NOTE — Patient Instructions (Signed)
Please call [facility] for follow up appointment time.   For weight loss: cut out refined sugar (anything that is sweet when you eat or drink it). Fresh fruit is OK-eat all fresh fruit you want. A pear everyday will help with constipation. Also cut back fast food to once/week. Walk for 30 minutes every day.  For best blood pressure control: Limit salt to 2400 mg/day (big-mac has that much-yikes!!!). Walk daily. Read about DASH diet and incorporate into diet.  For diabetes: cut out refined sugar (see above). Continue w/40 u lantus daily and use novolog as needed based on sliding scale after each meal (3 times daily). You must check blood sugars 3 times day about 30 mins to 1 hr after you eat for this regimen to work. Please check fasting sugar in am (no food) at least 3 times before next appointment. Also check blood sugar at bedtime 3 times. Also write numbers down after you eat for sliding scale. Bring numbers in to next appointment.   Foot care: Continue daily inspection. Clean sore on top of foot daily w/mild soap. Apply thin layer vaseline and band-aid.  [facility] will call you appointment time for nutrition class.   Get mammogram done.  For constipation: eat pear every day. Take miralax daily: 1 capful mixed with 4 oz water twice daily.   DASH Diet The DASH diet stands for "Dietary Approaches to Stop Hypertension." It is a healthy eating plan that has been shown to reduce high blood pressure (hypertension) in as little as 14 days, while also possibly providing other significant health benefits. These other health benefits include reducing the risk of breast cancer after menopause and reducing the risk of type 2 diabetes, heart disease, colon cancer, and stroke. Health benefits also include weight loss and slowing kidney failure in patients with chronic kidney disease.  DIET GUIDELINES  Limit salt (sodium). Your diet should contain less than 1500 mg of sodium  daily.  Limit refined or processed carbohydrates. Your diet should include mostly whole grains. Desserts and added sugars should be used sparingly.  Include small amounts of heart-healthy fats. These types of fats include nuts, oils, and tub margarine. Limit saturated and trans fats. These fats have been shown to be harmful in the body. CHOOSING FOODS  The following food groups are based on a 2000 calorie diet. See your Registered Dietitian for individual calorie needs. Grains and Grain Products (6 to 8 servings daily)  Eat More Often: Whole-wheat bread, brown rice, whole-grain or wheat pasta, quinoa, popcorn without added fat or salt (air popped).  Eat Less Often: White bread, white pasta, white rice, cornbread. Vegetables (4 to 5 servings daily)  Eat More Often: Fresh, frozen, and canned vegetables. Vegetables may be raw, steamed, roasted, or grilled with a minimal amount of fat.  Eat Less Often/Avoid: Creamed or fried vegetables. Vegetables in a cheese sauce. Fruit (4 to 5 servings daily)  Eat More Often: All fresh, canned (in natural juice), or frozen fruits. Dried fruits without added sugar. One hundred percent fruit juice ( cup [237 mL] daily).  Eat Less Often: Dried fruits with added sugar. Canned fruit in light or heavy syrup. [employer], Fish, and Poultry (2 servings or less daily. One serving is 3 to 4 oz [85-114 g]).  Eat More Often: Ninety percent or leaner ground beef, tenderloin, sirloin. Round cuts of beef, chicken breast, [place] breast. All fish. Grill, bake, or broil your meat. Nothing should be fried.  Eat Less Often/Avoid:  Fatty cuts of meat, [place], or chicken leg, thigh, or wing. Fried cuts of meat or fish. Dairy (2 to 3 servings)  Eat More Often: Low-fat or fat-free milk, low-fat plain or light yogurt, reduced-fat or part-skim cheese.  Eat Less Often/Avoid: Milk (whole, 2%).Whole milk yogurt. Full-fat cheeses. Nuts, Seeds, and Legumes (4 to 5 servings per  week)  Eat More Often: All without added salt.  Eat Less Often/Avoid: Salted nuts and seeds, canned beans with added salt. Fats and Sweets (limited)  Eat More Often: Vegetable oils, tub margarines without trans fats, sugar-free gelatin. Mayonnaise and salad dressings.  Eat Less Often/Avoid: Coconut oils, palm oils, butter, stick margarine, cream, half and half, cookies, candy, pie. FOR MORE INFORMATION The Dash Diet Eating Plan: www.dashdiet.org Document Released: 12/02/2011 Document Revised: 03/06/2012 Document Reviewed: 12/02/2011 [facility] Patient Information 2014 [place], [place].

## 2014-04-26 NOTE — Progress Notes (Signed)
Subjective:     Heather [name] is a 42 y.o. female who presents for hospital follow up. Heather [name] was hospitalized for stroke symptoms. W/u proved not to be r/t to neuro event, rather r/t neuropathy. Hospital records reviewed thoroughly. Pt had decreased urine output. Lisinopril held, lasix started-diuresed 3 L. Insulin changed from 70/70 to lantus & SSI. Future dialysis discussed. Cardiac w/u including echo showed EF at 65-70%. Pt is asking when she can go back to work. 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 Pt states she feels well.  The following portions of the patient's history were reviewed and updated as appropriate: allergies, current medications, past medical history, past social history, past surgical history and problem list.  Review of Systems Pertinent items are noted in HPI.    Objective:    BP 120/76  Pulse 96  Temp(Src) 98.2 F (36.8 C) (Oral)  Ht 5' 8.17" (1.732 m)  Wt 247 lb (112.038 kg)  BMI 37.35 kg/m2  SpO2 99%  LMP 04/11/2014 BP 120/76  Pulse 96  Temp(Src) 98.2 F (36.8 C) (Oral)  Ht 5' 8.17" (1.732 m)  Wt 247 lb (112.038 kg)  BMI 37.35 kg/m2  SpO2 99%  LMP 04/11/2014 General appearance: alert, cooperative, appears stated age, fatigued, no distress, moderately obese and seems a little "foggy-headed" trouble recalling events. Head: Normocephalic, without obvious abnormality, atraumatic Eyes: negative findings: lids and lashes normal and conjunctivae and sclerae normal Lungs: clear to auscultation bilaterally Heart: regular rate and rhythm, S1, S2 normal, no murmur, click, rub or gallop Extremities: edema mild, +1 pitting Bilat LE Skin: feet: sore top of L foot, healing sore R heel, areas of excoriation L foot-scratches in sleep.    Assessment & plan:     1. Anemia in chronic kidney disease - Vitamin B12 - POCT occult blood stool Continue procrit, f/u neph. 2. Chronic kidney disease, stage IV (severe) - Vit D  25 hydroxy (rtn osteoporosis monitoring) 3.  Diabetes lantus 40 u qd, SSI - Lipid panel -Vitamin B12 4. Need for pneumococcal vaccination Admin today - Pneumococcal polysaccharide vaccine 23-valent greater than or equal to 2yo subcutaneous/IM 5. HYPERTENSION Salt restriction, weight loss 6. Gastroparesis Constipation Exercise, fresh fruit, miralax 7. Diabetic foot ulcer F/u 2 weeks Skin care instructions given, demonstrated.  See problem list for complete A&P F/u 2 weeks

## 2014-04-26 NOTE — Telephone Encounter (Signed)
Spoke w/Dr deterding. He wants pt to restart lisinopril in 1 week. She needs to take furosemide daily. She will f/u w/Deterding 5/18 @ 2:30. Has blood pressure check 5/5 at his ofc. Dialysis will be considered in future, according to Dr Deterding. Transplant was discussed, but pt has to get BMI under 40. Will continue to work with her to get weight down. Relayed all info to pt.

## 2014-04-27 LAB — VITAMIN D 25 HYDROXY (VIT D DEFICIENCY, FRACTURES): Vit D, 25-Hydroxy: 11 ng/mL — ABNORMAL LOW (ref 30–89)

## 2014-04-30 ENCOUNTER — Telehealth: Payer: Self-pay | Admitting: *Deleted

## 2014-04-30 DIAGNOSIS — E538 Deficiency of other specified B group vitamins: Secondary | ICD-10-CM

## 2014-04-30 DIAGNOSIS — E785 Hyperlipidemia, unspecified: Secondary | ICD-10-CM

## 2014-04-30 LAB — HEMOCCULT GUIAC POC 1CARD (OFFICE): Fecal Occult Blood, POC: NEGATIVE

## 2014-04-30 NOTE — Telephone Encounter (Signed)
Pt left vm requesting call when her FMLA forms are received in office.

## 2014-04-30 NOTE — Telephone Encounter (Signed)
Called pt to let her know her FMLA forms have arrived and she will be contacted as soon as they are completed

## 2014-04-30 NOTE — Telephone Encounter (Signed)
Vit D low LDL high, Triglycerides high-5 mg simvastatin qd (renal adj) Welchol better for glucose, but causes constipation-already significant problem. B12 borderline

## 2014-05-02 MED ORDER — NEPHRO-VITE 0.8 MG PO TABS: 1.0000 | ORAL_TABLET | Freq: Every day | ORAL | Status: DC

## 2014-05-02 MED ORDER — SIMVASTATIN 5 MG PO TABS: 5.0000 mg | ORAL_TABLET | Freq: Every day | ORAL | Status: DC

## 2014-05-02 NOTE — Telephone Encounter (Signed)
pls call pt: Advise Start vitamin daily-I sent prescription in. It is called nephro-vite. Start cholesterol medicine. I spoke with kidney doctor, he approved both meds. Kidney doc will start vitamin D supplement. Ask if she needs home health nurse to come out & help with medications-insulin?

## 2014-05-02 NOTE — Telephone Encounter (Signed)
LMOVM for pt to return call 

## 2014-05-03 NOTE — Telephone Encounter (Signed)
LMOVM (cell) for pt to return call. 2nd message.

## 2014-05-06 ENCOUNTER — Encounter (HOSPITAL_COMMUNITY): Payer: Managed Care, Other (non HMO)

## 2014-05-09 ENCOUNTER — Encounter: Payer: Self-pay | Admitting: Nurse Practitioner

## 2014-05-10 ENCOUNTER — Encounter: Payer: Self-pay | Admitting: *Deleted

## 2014-05-10 ENCOUNTER — Encounter: Payer: Self-pay | Admitting: Nurse Practitioner

## 2014-05-10 ENCOUNTER — Ambulatory Visit (INDEPENDENT_AMBULATORY_CARE_PROVIDER_SITE_OTHER): Payer: Managed Care, Other (non HMO) | Admitting: Nurse Practitioner

## 2014-05-10 VITALS — BP 149/93 | HR 89 | Temp 98.0°F | Ht 68.17 in | Wt 247.0 lb

## 2014-05-10 DIAGNOSIS — L97509 Non-pressure chronic ulcer of other part of unspecified foot with unspecified severity: Secondary | ICD-10-CM

## 2014-05-10 DIAGNOSIS — E1169 Type 2 diabetes mellitus with other specified complication: Secondary | ICD-10-CM

## 2014-05-10 DIAGNOSIS — K3184 Gastroparesis: Secondary | ICD-10-CM

## 2014-05-10 DIAGNOSIS — E538 Deficiency of other specified B group vitamins: Secondary | ICD-10-CM | POA: Insufficient documentation

## 2014-05-10 DIAGNOSIS — E1165 Type 2 diabetes mellitus with hyperglycemia: Secondary | ICD-10-CM

## 2014-05-10 DIAGNOSIS — E11621 Type 2 diabetes mellitus with foot ulcer: Secondary | ICD-10-CM

## 2014-05-10 DIAGNOSIS — E559 Vitamin D deficiency, unspecified: Secondary | ICD-10-CM

## 2014-05-10 DIAGNOSIS — E119 Type 2 diabetes mellitus without complications: Secondary | ICD-10-CM

## 2014-05-10 DIAGNOSIS — I10 Essential (primary) hypertension: Secondary | ICD-10-CM

## 2014-05-10 HISTORY — DX: Deficiency of other specified B group vitamins: E53.8

## 2014-05-10 NOTE — Patient Instructions (Signed)
Start levemir insulin. This is long acting. It will cover you for 24 hours. Take 20 units with largest meal once daily. Check blood sugar in morning before food, before each meal, and at bedtime. Keep using novolog sliding scale if needed, so it is important to check blood sugar before each meal. Great job with diet changes and increasing activity!!! Continue to limit sodium to 2400 mg daily. Start counting fiber grams. I want you to eat 50 grams fiber daily from whole foods: fresh fruit, berries, melon, vegetables, brown rice, beans. I will call you next week to see what sugars are. We will likely have to go up on dose. The goal is to stop short acting insulin.  Start nephrovite.  Keep appointment with Dr Jimmy Footman next week.  Great to see you!

## 2014-05-10 NOTE — Telephone Encounter (Signed)
Pt was seen for ov today.

## 2014-05-10 NOTE — Progress Notes (Signed)
Subjective:     Heather [name] is a 43 y.o. female is accompanied by her husband and presents for follow up of diabetes management, constipation, and weight loss. Regarding DM: she has made positive changes w/diet-lower salt, eating more fruits & veggies, less fast food. [name] walking daily. Unfortunately, she is using 70/30 with SSI. She has not started lantus as she cannot afford it. I spent several minutes discussing difference b/w insulins & goal of 1 administration daily of L.A insulin. [name] motivated. We will inquire about pt assistance for Lantus. Constipation has improved w/diet & activity changes. She feels better & is having 3-4 bm/wk rather than 1 every week to 2 weeks. Pruritus improved. Weight is stable: no loss or gain. She has been working 1/2 days and is looking forward to returning full days next week.  The following portions of the patient's history were reviewed and updated as appropriate: allergies, current medications, past medical history, past social history, past surgical history and problem list.  Review of Systems Pertinent items are noted in HPI.    Objective:    BP 149/93  Pulse 89  Temp(Src) 98 F (36.7 C) (Temporal)  Ht 5' 8.17" (1.732 m)  Wt 247 lb (112.038 kg)  BMI 37.35 kg/m2  SpO2 100%  LMP 04/28/2014 BP 149/93  Pulse 89  Temp(Src) 98 F (36.7 C) (Temporal)  Ht 5' 8.17" (1.732 m)  Wt 247 lb (112.038 kg)  BMI 37.35 kg/m2  SpO2 100%  LMP 04/28/2014 General appearance: alert, cooperative, appears stated age and no distress Head: Normocephalic, without obvious abnormality, atraumatic Eyes: negative findings: lids and lashes normal and conjunctiva mildly injected Lungs: clear to auscultation bilaterally Heart: regular rate and rhythm, S1, S2 normal, no murmur, click, rub or gallop Extremities: mild sweeling in R foot, edema much improved. Pulses: 2+ and symmetric Skin: healing lesion top l foot sz of dime. dry. white.    Assessment:     1.  HYPERTENSION   2. Gastroparesis   3. Poorly controlled diabetes mellitus   4. Unspecified vitamin D deficiency   5. B12 deficiency   6. Diabetic foot ulcer     Plan:   See problem list for complete A&P Will F/u by phone next week for bs. F/u in ofc 2 weeks.

## 2014-05-10 NOTE — Assessment & Plan Note (Addendum)
Heather [name] cannot afford Lantus. She has not purchased it. [name] still using 70/30, but w/sliding scale. Fasting BS ranges 432-229 (most in 200's range). Pm BS: 366-201. This is improvement! Discussed we still have work to do. Goal is to keep BS b/w 250-70. Discussed difference between novolog & lantus. Goal is to use only lantus or levemir. Levemir samples given to last 40 days: 20u qd. Apply for pt assistance program w/Lantus.  Continue to check am, pm, & every meal bs through weekend & use SSI if needed w/meals. Goal is get consistent control w/LA insulin so no need for SSI. Consume 50 grams fiber/daily from whole foods. Go to nutrition classes. Will call pt next week & adjust insulin as needed. F/u 2 weeks

## 2014-05-10 NOTE — Assessment & Plan Note (Signed)
May be contributing to neuropathy. Discussed supplement w/Dr Deterding: nephrovite OK. Pt has not started. Encouraged to pick up & start.

## 2014-05-10 NOTE — Assessment & Plan Note (Signed)
Top L foot: improved, dry, white. Applied bacitracin & band aid.

## 2014-05-10 NOTE — Assessment & Plan Note (Signed)
Constipation much improved with increasing activity and increasing fruits & vegetables.  BM 3/wk rather than 1/wk. Increase fiber to 50 grams daily form whole foods.

## 2014-05-10 NOTE — Progress Notes (Signed)
Pre visit review using our clinic review tool, if applicable. No additional management support is needed unless otherwise documented below in the visit note. 

## 2014-05-10 NOTE — Assessment & Plan Note (Signed)
Discussed w/Dr Deterding. He will manage Vit D deficiency.

## 2014-05-17 ENCOUNTER — Telehealth: Payer: Self-pay | Admitting: Nurse Practitioner

## 2014-05-17 ENCOUNTER — Encounter (HOSPITAL_COMMUNITY)
Admission: RE | Admit: 2014-05-17 | Discharge: 2014-05-17 | Disposition: A | Discharge status: Home / Self Care | Payer: Managed Care, Other (non HMO) | Source: Ambulatory Visit | Attending: Nephrology | Admitting: Nephrology

## 2014-05-17 VITALS — BP 126/79 | HR 93 | Temp 98.0°F | Resp 20

## 2014-05-17 DIAGNOSIS — N182 Chronic kidney disease, stage 2 (mild): Secondary | ICD-10-CM

## 2014-05-17 LAB — IRON AND TIBC
Iron: 71 ug/dL (ref 42–135)
SATURATION RATIOS: 26 % (ref 20–55)
TIBC: 278 ug/dL (ref 250–470)
UIBC: 207 ug/dL (ref 125–400)

## 2014-05-17 LAB — POCT HEMOGLOBIN-HEMACUE: Hemoglobin: 9.8 g/dL — ABNORMAL LOW (ref 12.0–15.0)

## 2014-05-17 LAB — FERRITIN: FERRITIN: 76 ng/mL (ref 10–291)

## 2014-05-17 MED ORDER — EPOETIN ALFA 10000 UNIT/ML IJ SOLN
INTRAMUSCULAR | Status: AC
  Administered 2014-05-17: 10000 [IU]
  Filled 2014-05-17: qty 1

## 2014-05-17 MED ORDER — EPOETIN ALFA 40000 UNIT/ML IJ SOLN: 30000.0000 [IU] | INTRAMUSCULAR | Status: DC

## 2014-05-17 MED ORDER — EPOETIN ALFA 20000 UNIT/ML IJ SOLN
INTRAMUSCULAR | Status: AC
  Administered 2014-05-17: 20000 [IU]
  Filled 2014-05-17: qty 1

## 2014-05-17 NOTE — Telephone Encounter (Signed)
Please call pt.  Ask what blood sugars are running: morning, before meals, pm.

## 2014-05-17 NOTE — Telephone Encounter (Signed)
Unable to leave message

## 2014-05-22 NOTE — Telephone Encounter (Signed)
Tried pt again. Pt's # has message saying pt is not accepting calls. Will try again later.

## 2014-05-24 ENCOUNTER — Ambulatory Visit (INDEPENDENT_AMBULATORY_CARE_PROVIDER_SITE_OTHER): Payer: Managed Care, Other (non HMO) | Admitting: Nurse Practitioner

## 2014-05-24 ENCOUNTER — Encounter: Payer: Self-pay | Admitting: Nurse Practitioner

## 2014-05-24 ENCOUNTER — Ambulatory Visit (HOSPITAL_COMMUNITY)
Admission: RE | Admit: 2014-05-24 | Discharge: 2014-05-24 | Disposition: A | Discharge status: Home / Self Care | Payer: Managed Care, Other (non HMO) | Source: Ambulatory Visit | Attending: Nurse Practitioner | Admitting: Nurse Practitioner

## 2014-05-24 ENCOUNTER — Ambulatory Visit (HOSPITAL_BASED_OUTPATIENT_CLINIC_OR_DEPARTMENT_OTHER): Admission: RE | Admit: 2014-05-24 | Payer: Managed Care, Other (non HMO) | Source: Ambulatory Visit

## 2014-05-24 ENCOUNTER — Telehealth: Payer: Self-pay | Admitting: Nurse Practitioner

## 2014-05-24 ENCOUNTER — Ambulatory Visit (HOSPITAL_BASED_OUTPATIENT_CLINIC_OR_DEPARTMENT_OTHER): Payer: Managed Care, Other (non HMO)

## 2014-05-24 ENCOUNTER — Ambulatory Visit (HOSPITAL_BASED_OUTPATIENT_CLINIC_OR_DEPARTMENT_OTHER)
Admission: RE | Admit: 2014-05-24 | Discharge: 2014-05-24 | Disposition: A | Discharge status: Home / Self Care | Payer: Managed Care, Other (non HMO) | Source: Ambulatory Visit | Attending: Nurse Practitioner | Admitting: Nurse Practitioner

## 2014-05-24 ENCOUNTER — Other Ambulatory Visit: Payer: Self-pay | Admitting: Nurse Practitioner

## 2014-05-24 VITALS — BP 158/94 | HR 85 | Temp 97.8°F | Ht 68.17 in | Wt 243.0 lb

## 2014-05-24 DIAGNOSIS — IMO0002 Reserved for concepts with insufficient information to code with codable children: Secondary | ICD-10-CM

## 2014-05-24 DIAGNOSIS — M79609 Pain in unspecified limb: Secondary | ICD-10-CM | POA: Insufficient documentation

## 2014-05-24 DIAGNOSIS — E1169 Type 2 diabetes mellitus with other specified complication: Secondary | ICD-10-CM

## 2014-05-24 DIAGNOSIS — L089 Local infection of the skin and subcutaneous tissue, unspecified: Principal | ICD-10-CM

## 2014-05-24 DIAGNOSIS — L97509 Non-pressure chronic ulcer of other part of unspecified foot with unspecified severity: Secondary | ICD-10-CM

## 2014-05-24 DIAGNOSIS — E1165 Type 2 diabetes mellitus with hyperglycemia: Secondary | ICD-10-CM

## 2014-05-24 DIAGNOSIS — E11621 Type 2 diabetes mellitus with foot ulcer: Secondary | ICD-10-CM

## 2014-05-24 DIAGNOSIS — M546 Pain in thoracic spine: Secondary | ICD-10-CM

## 2014-05-24 DIAGNOSIS — E119 Type 2 diabetes mellitus without complications: Secondary | ICD-10-CM

## 2014-05-24 DIAGNOSIS — E11628 Type 2 diabetes mellitus with other skin complications: Secondary | ICD-10-CM

## 2014-05-24 MED ORDER — INSULIN GLARGINE 100 UNIT/ML ~~LOC~~ SOLN: SUBCUTANEOUS | Status: DC

## 2014-05-24 MED ORDER — AMOXICILLIN-POT CLAVULANATE 500-125 MG PO TABS: 1.0000 | ORAL_TABLET | ORAL | Status: DC

## 2014-05-24 NOTE — Telephone Encounter (Signed)
Patient called office with her blood sugar readings. List is on [employer].

## 2014-05-24 NOTE — Patient Instructions (Addendum)
Increase Lantus to 25u once daily at largest meal of day.  Start back stretches. Use heat several times daily. Pay attention to posture, pull shoulders back when sitting & standing. Get xrays of back & foot.  Soak foot in warm water & hydrogen peroxide twice daily. Apply new vaseline gauze twice daily. Minimize walking to 10 to 15 minutes twice daily.  You must see Dr Sharol Given on Wednesday, June 3rd at 2:00 for debridement of foot ulcer.  You will see Dr Tamala Julian to evaluate back.  Back Exercises Back exercises help treat and prevent back injuries. The goal of back exercises is to increase the strength of your abdominal and back muscles and the flexibility of your back. These exercises should be started when you no longer have back pain. Back exercises include:  Pelvic Tilt. Lie on your back with your knees bent. Tilt your pelvis until the lower part of your back is against the floor. Hold this position 5 to 10 sec and repeat 5 to 10 times.  Knee to Chest. Pull first 1 knee up against your chest and hold for 20 to 30 seconds, repeat this with the other knee, and then both knees. This may be done with the other leg straight or bent, whichever feels better.  Sit-Ups or Curl-Ups. Bend your knees 90 degrees. Start with tilting your pelvis, and do a partial, slow sit-up, lifting your trunk only 30 to 45 degrees off the floor. Take at least 2 to 3 seconds for each sit-up. Do not do sit-ups with your knees out straight. If partial sit-ups are difficult, simply do the above but with only tightening your abdominal muscles and holding it as directed.  Hip-Lift. Lie on your back with your knees flexed 90 degrees. Push down with your feet and shoulders as you raise your hips a couple inches off the floor; hold for 10 seconds, repeat 5 to 10 times.  Shoulder-Lifts. Lie face down with arms beside your body. Keep hips and torso pressed to floor as you slowly lift your head and shoulders off the floor. Do not overdo  your exercises, especially in the beginning. Exercises may cause you some mild back discomfort which lasts for a few minutes; however, if the pain is more severe, or lasts for more than 15 minutes, do not continue exercises until you see your caregiver. Improvement with exercise therapy for back problems is slow.  See your caregivers for assistance with developing a proper back exercise program. Document Released: 01/20/2005 Document Revised: 03/06/2012 Document Reviewed: 10/14/2011 [facility] Patient Information 2014 [place].

## 2014-05-24 NOTE — Progress Notes (Signed)
Subjective:     Heather [name] is a 43 y.o. female who presents for evaluation of thoracic back pain for 3 months. She was evaluated in ED about 2 mos ago & treated with flexeril. She reports it did not relieve pain. Back hurts all the time, nothing makes better or worse. [name] limited to meds she can take due to CKD, particularly NSAIDS.  She also is concerned about sore on foot. She just noticed sore a today. She has been walking more to lose weight, likely the cause. We discussed blood sugars. She brought in log with more than half sugars under 250, an improvement for her. [name] using lantus 20u daily and SSI.  The following portions of the patient's history were reviewed and updated as appropriate: allergies, current medications, past medical history, past social history, past surgical history and problem list.  Review of Systems Pertinent items are noted in HPI.    Objective:    BP 158/94  Pulse 85  Temp(Src) 97.8 F (36.6 C) (Temporal)  Ht 5' 8.17" (1.732 m)  Wt 243 lb (110.224 kg)  BMI 36.74 kg/m2  SpO2 99%  LMP 04/28/2014 BP 158/94  Pulse 85  Temp(Src) 97.8 F (36.6 C) (Temporal)  Ht 5' 8.17" (1.732 m)  Wt 243 lb (110.224 kg)  BMI 36.74 kg/m2  SpO2 99%  LMP 04/28/2014 General appearance: alert, cooperative, appears stated age and no distress Head: Normocephalic, without obvious abnormality, atraumatic Eyes: negative findings: lids and lashes normal and conjunctivae and sclerae normal Back: kyphosis. no spinal tenderness. tender to right of spine mid thorax-states feels better when I apply pressure to muscles. Extremities: edema R LE from foot to knee, non-pitting.Warm R foot. Pressure ulcer under MTP joint, bottom foot.  and . Pulses: 2+ and symmetric no sensation from mid calf to toes, bilat. Skin: 1.5 cm round pressure ulcer R foot bottom under 1st MTP joint. Open, deep, scant amount serous fluid .    Assessment & Plan:     1. Diabetes   2. Diabetic foot ulcer    3. Thoracic back pain   4. Poorly controlled diabetes mellitus     See problem list for complete A&P Appt w/Dr Sharol Given in 5 days for debridement of diabetic pressure ulcer.

## 2014-05-24 NOTE — Assessment & Plan Note (Addendum)
Pain for 3 mos. ED visit 2 mos ago. Flexeril prescribed. Not effective. Pain adjacent to spine R thorax. Massage makes it feel better. Likely musculoskeletal. Risk for osteoporosis: diabetes, kidney disease, sedentary. Will send urine for Cx, although no CVA tenderness. Heat. Stretches. Ref to Dr Gardenia Phlegm for eval & Tx,

## 2014-05-24 NOTE — Progress Notes (Signed)
Pre visit review using our clinic review tool, if applicable. No additional management support is needed unless otherwise documented below in the visit note. 

## 2014-05-24 NOTE — Assessment & Plan Note (Signed)
1.5 cm open pressure ulcer R foot, under 1st MTP.  Scant serous drainage. Foot is warm. Leg is swollen up to knee. Likely due to increased walking-trying to lose weight & have better health habits. Start augmentin. Wound care in ofc & instructions given-see pt instructions. Appt set up w/Dr Sharol Given for 6/3 for surgical debridement.

## 2014-05-24 NOTE — Assessment & Plan Note (Addendum)
Current meds: 20 u lantus, ssi. Lost 4 pounds! More than half BS on log are under 250. This is improvement. Fasting range is 151-349, before meal is 130-509,  Pm is 139 -527. Increase insulin to 25u qd w/largest meal. Continue SSI. New diabetic foot pressure ulcer.

## 2014-05-24 NOTE — Telephone Encounter (Signed)
Appears no infection in joint.

## 2014-05-26 LAB — URINE CULTURE: Colony Count: 3000

## 2014-05-27 ENCOUNTER — Other Ambulatory Visit: Payer: Self-pay | Admitting: Nurse Practitioner

## 2014-05-27 NOTE — Telephone Encounter (Signed)
Tried to call pt on cell #, received message stating that pt "is not taking calls st this time." Will try again later.

## 2014-05-27 NOTE — Telephone Encounter (Signed)
Patient notified. Rx and insurance info for crutches faxed to [employer] at [employer]. [phone]

## 2014-05-27 NOTE — Telephone Encounter (Signed)
Called [facility] on [address] in [place] (per pt) to make sure they have crutches in stock. Pharmacy stated that in order for insurance to cover some of the cost, rx will need to be send to medical supply store. Patient lives and works in [place] so she wants rx sent to a supply store there. Patient stated that best # to reach her is [phone].

## 2014-05-27 NOTE — Telephone Encounter (Signed)
Please advise Xray indicates wound does not involve joint -yaay! She needs crutches-should not put any weight on foot. Script written for crutches. She can get them at [facility]. [facility] in [place] has them & the [employer] in [place] has them. I pended script-it can be printed & faxed to her or sent to [facility].  I need to speak with her about going to wound center. Please get a good # to reach her if I am not available when she calls back.

## 2014-05-27 NOTE — Telephone Encounter (Signed)
Discussed need to offload foot. Pt will use crutches. Discussed how to use: 2 finger widths between axilla & crutch & no weight bearing on axillae-keep weight at hands. She will continue antibiotic, foot soaks w/warm water hydrogen peroxide 3:1 & 1/4 tsp chlorox twice daily. She states leg & foot still warm & swollen. Wound still draining serous fluid. She thinks there is mild odor. She will see Dr Sharol Given in 2 days for debridement & wound center on Friday. Pt feels shaky when BS lower than 150. Advise start using SSI at 150, not below. She has increased lantus to 25u qd-takes at lunch. Adv no SSI at lunch time, only at dinner & breakfast.

## 2014-05-29 ENCOUNTER — Telehealth: Payer: Self-pay | Admitting: Nurse Practitioner

## 2014-05-29 NOTE — Telephone Encounter (Signed)
Pt unable to get crutches at [place] supply store. Not sure why. Pt will let us know if she wants to pick up script at our office or we can fax to her.

## 2014-05-30 ENCOUNTER — Telehealth: Payer: Self-pay | Admitting: *Deleted

## 2014-05-30 NOTE — Telephone Encounter (Signed)
When pt calls back, make sure she is aware that when she goes to her wound care appt 05/31/14 at 1:00 pm she can get crutches from them.

## 2014-05-30 NOTE — Telephone Encounter (Signed)
Patient has been advised that she can check on crutches while she is at her wound care appt. If she decides to go to [facility] she can call our office & ask for Rx to be faxed over.

## 2014-05-30 NOTE — Telephone Encounter (Signed)
SPoke with pt, gave message from April.

## 2014-05-30 NOTE — Telephone Encounter (Signed)
Found crutches at [facility] for $30. Pt needs to call her insurance if she has anymore questions about cost. Left vm for pt to call back.

## 2014-05-31 ENCOUNTER — Encounter (HOSPITAL_COMMUNITY)
Admission: RE | Admit: 2014-05-31 | Discharge: 2014-05-31 | Disposition: A | Discharge status: Home / Self Care | Payer: Managed Care, Other (non HMO) | Source: Ambulatory Visit | Attending: Nephrology | Admitting: Nephrology

## 2014-05-31 ENCOUNTER — Encounter (HOSPITAL_BASED_OUTPATIENT_CLINIC_OR_DEPARTMENT_OTHER): Payer: Managed Care, Other (non HMO) | Attending: General Surgery

## 2014-05-31 VITALS — BP 146/87 | HR 85 | Temp 97.8°F | Resp 20 | Ht 68.0 in | Wt 243.0 lb

## 2014-05-31 DIAGNOSIS — Z794 Long term (current) use of insulin: Secondary | ICD-10-CM | POA: Insufficient documentation

## 2014-05-31 DIAGNOSIS — E1169 Type 2 diabetes mellitus with other specified complication: Secondary | ICD-10-CM | POA: Insufficient documentation

## 2014-05-31 DIAGNOSIS — Z79899 Other long term (current) drug therapy: Secondary | ICD-10-CM | POA: Insufficient documentation

## 2014-05-31 DIAGNOSIS — L97509 Non-pressure chronic ulcer of other part of unspecified foot with unspecified severity: Secondary | ICD-10-CM | POA: Insufficient documentation

## 2014-05-31 DIAGNOSIS — I10 Essential (primary) hypertension: Secondary | ICD-10-CM | POA: Insufficient documentation

## 2014-05-31 DIAGNOSIS — N182 Chronic kidney disease, stage 2 (mild): Secondary | ICD-10-CM | POA: Insufficient documentation

## 2014-05-31 LAB — POCT HEMOGLOBIN-HEMACUE: Hemoglobin: 10.7 g/dL — ABNORMAL LOW (ref 12.0–15.0)

## 2014-05-31 MED ORDER — EPOETIN ALFA 40000 UNIT/ML IJ SOLN: 30000.0000 [IU] | INTRAMUSCULAR | Status: DC

## 2014-05-31 MED ORDER — EPOETIN ALFA 10000 UNIT/ML IJ SOLN
INTRAMUSCULAR | Status: AC
  Administered 2014-05-31: 10000 [IU] via SUBCUTANEOUS
  Filled 2014-05-31: qty 1

## 2014-05-31 MED ORDER — EPOETIN ALFA 20000 UNIT/ML IJ SOLN
INTRAMUSCULAR | Status: AC
  Administered 2014-05-31: 20000 [IU] via SUBCUTANEOUS
  Filled 2014-05-31: qty 1

## 2014-06-01 NOTE — Progress Notes (Signed)
[facility]  NAME:  Heather [name], Heather [name] NO.:  [id number]  MEDICAL RECORD NO.:  [id number]      DATE OF BIRTH:  [birth year]  PHYSICIAN:  Judene Companion, M.D.           VISIT DATE:                                  OFFICE VISIT   [name] a 43 year old diabetic lady who comes to [name] with a diabetic foot ulcer about a cm in diameter on the plantar aspect of her right foot at the level of the first MP joint.  She has had an x-ray of this foot which shows no osteomyelitis.  She has been to the hospital and has been placed on antibiotics, clindamycin and vancomycin.  She comes here today with a temperature of 98, pulse 90, blood pressure 131/85.  [name] quite obese.  She weighs 243 pounds.  [name] on several medicines including insulin and Flexeril, Lasix.  [name] on both NovoLog and Lantus insulin. [name] also on lisinopril for her blood pressure.  Today I debrided it, and we treated it with the collagen and we lined her up for a cast which we put on today and we are writing her insurance company to get her qualified for a Dermagraft.  So her diagnoses: 1. Diabetes mellitus treated with insulin. 2. Hypertension. 3. Morbid obesity. 4. Diabetic foot ulcer.     Judene Companion, M.D.     PP/MEDQ  D:  05/31/2014  T:  06/01/2014  Job:  [id number]

## 2014-06-14 ENCOUNTER — Encounter (HOSPITAL_COMMUNITY): Payer: Managed Care, Other (non HMO)

## 2014-06-14 ENCOUNTER — Ambulatory Visit (INDEPENDENT_AMBULATORY_CARE_PROVIDER_SITE_OTHER): Payer: Managed Care, Other (non HMO) | Admitting: Family Medicine

## 2014-06-14 ENCOUNTER — Ambulatory Visit (INDEPENDENT_AMBULATORY_CARE_PROVIDER_SITE_OTHER)
Admission: RE | Admit: 2014-06-14 | Discharge: 2014-06-14 | Disposition: A | Discharge status: Home / Self Care | Payer: Managed Care, Other (non HMO) | Source: Ambulatory Visit | Attending: Family Medicine | Admitting: Family Medicine

## 2014-06-14 ENCOUNTER — Encounter: Payer: Self-pay | Admitting: Family Medicine

## 2014-06-14 VITALS — BP 138/84 | HR 91 | Ht 69.0 in | Wt 257.0 lb

## 2014-06-14 DIAGNOSIS — M216X2 Other acquired deformities of left foot: Secondary | ICD-10-CM

## 2014-06-14 DIAGNOSIS — M216X9 Other acquired deformities of unspecified foot: Secondary | ICD-10-CM

## 2014-06-14 DIAGNOSIS — M545 Low back pain, unspecified: Secondary | ICD-10-CM

## 2014-06-14 DIAGNOSIS — M216X1 Other acquired deformities of right foot: Secondary | ICD-10-CM

## 2014-06-14 DIAGNOSIS — M533 Sacrococcygeal disorders, not elsewhere classified: Secondary | ICD-10-CM

## 2014-06-14 DIAGNOSIS — E669 Obesity, unspecified: Secondary | ICD-10-CM

## 2014-06-14 DIAGNOSIS — M999 Biomechanical lesion, unspecified: Secondary | ICD-10-CM | POA: Insufficient documentation

## 2014-06-14 DIAGNOSIS — M546 Pain in thoracic spine: Secondary | ICD-10-CM

## 2014-06-14 HISTORY — DX: Other acquired deformities of right foot: M21.6X1

## 2014-06-14 HISTORY — DX: Other acquired deformities of right foot: M21.6X2

## 2014-06-14 NOTE — Assessment & Plan Note (Signed)
Decision today to treat with OMT was based on Physical Exam  After verbal consent patient was treated with HVLAtechniques in thoracic, lumbar and sacral areas  Patient tolerated the procedure well with improvement in symptoms  Patient given exercises, stretches and lifestyle modifications  See medications in patient instructions if given  Patient will follow up in 3-4 weeks

## 2014-06-14 NOTE — Patient Instructions (Signed)
Good to meet you Ice 20 minutes 2 times a day Xrays downstairs today.  Exercises 3 times a week.  Take tylenol 650 mg three times a day is the best evidence based medicine we have for arthritis.  Glucosamine sulfate 750mg  twice a day is a supplement that has been shown to help moderate to severe arthritis. Vitamin D 2000 IU daily Fish oil 2 grams daily.  Tumeric 500mg  twice daily.  Capsaicin topically up to four times a day may also help with pain. It's important that you continue to stay active. Controlling your weight is important.  Consider physical therapy to strengthen muscles around the joint that hurts to take pressure off of the joint itself. Shoe inserts with good arch support may be helpful.  [employer] orthotics at [employer] could help.  Shoe shopping.  Bronwen Betters, [employer], New balance 700 or greater.  Water aerobics and cycling with low resistance are the best two types of exercise for arthritis. Come back and see me in 3 weeks.

## 2014-06-14 NOTE — Assessment & Plan Note (Signed)
Discussed multiple different treatment options. Patient is going to try over-the-counter orthotics or new shoes and try to get rigid soled shoes to try to avoid any over pronation of the foot. We discussed the possibility of her avoiding be barefoot. Patient will try these interventions and come back again in 3 weeks. She could be a candidate for custom orthotics at some point.

## 2014-06-14 NOTE — Assessment & Plan Note (Signed)
Patient's thoracic back pain is multifactorial with her scoliosis. Patient is also significantly overweight and does have pes planus bilaterally but does increase the amount of force on his back. Patient also in a Cam Walker at this moment which is definitely causing difficulty with her alignment. Patient did respond well to osteopathic mutilation today. We also discussed home exercise program, icing, and over-the-counter medicines he can be beneficial. Patient will follow up again with me in 3 weeks for further evaluation and likely treatment.

## 2014-06-14 NOTE — Progress Notes (Signed)
[name] [address] [place], [place] [address] Phone: [phone] Subjective:    I'm seeing this patient by the request  of:  WEAVER, LAYNE C, NP   CC: Back pain  [id number] Heather [name] is a 43 y.o. female coming in with complaint of back pain. Patient states most of his pain is in the thoracic back pain. Patient has had this pain for multiple years. Patient was told that at some point she did have scoliosis and there is a possible concern for her spina bifida but she does not remember where she hurt this. Patient has never had an MRI that has had x-rays she states at an outside facility. Patient states that she has a chronic back pain is more of a dull aching sensation without any radiation to the extremities. Patient states it hurts more with a lot of activity. Patient states the more weight she gains the more she notices it as well. Patient is not very active and is not do any regular exercise routine. Patient recently has had a wound on the lower aspect of her foot and has been in a Banker. Patient states that this is hurt her back a lot more with her walking. Patient is a severity at 7/10. Denies any nighttime awakening but states it is very difficult to get comfortable at night. Patient is not able to take anti-inflammatories secondary to chronic kidney disease.     Past medical history, social, surgical and family history all reviewed in electronic medical record.   Review of Systems: No headache, visual changes, nausea, vomiting, diarrhea, constipation, dizziness, abdominal pain, skin rash, fevers, chills, night sweats, weight loss, swollen lymph nodes, body aches, joint swelling, muscle aches, chest pain, shortness of breath, mood changes.   Objective Blood pressure 138/84, pulse 91, height 5\' 9"  (1.753 m), weight 257 lb (116.574 kg), last menstrual period 06/01/2014, SpO2 97.00%.  General: No apparent distress alert and oriented x3  mood and affect normal, dressed appropriately. Obese HEENT: Pupils equal, extraocular movements intact  Respiratory: Patient's speak in full sentences and does not appear short of breath  Cardiovascular: No lower extremity edema, non tender, no erythema  Skin: Warm dry intact with no signs of infection or rash on extremities or on axial skeleton.  Abdomen: Soft nontender  Neuro: Cranial nerves II through XII are intact, neurovascularly intact in all extremities with 2+ DTRs and 2+ pulses.  Lymph: No lymphadenopathy of posterior or anterior cervical chain or axillae bilaterally.  Gait antalgic MSK:  Non tender with full range of motion and good stability and symmetric strength and tone of shoulders, elbows, wrist, hip, knee and ankles bilaterally.  Back Exam:  Inspection: Patient does have scoliosis of the breast back. Significant poor strength and overweight Motion: Flexion 35 deg, Extension 25 deg, Side Bending to 25 deg bilaterally,  Rotation to 35 deg bilaterally  SLR laying: Negative  XSLR laying: Negative  Palpable tenderness: Tender to palpation mostly in the thoracolumbar juncture bilaterally.[name] FABER: negative. Sensory change: Gross sensation intact to all lumbar and sacral dermatomes.  Reflexes: 2+ at both patellar tendons, 2+ at achilles tendons, Babinski's downgoing.  Strength at foot  Plantar-flexion: 5/5 Dorsi-flexion: 5/5 Eversion: 5/5 Inversion: 4/5  Leg strength  Quad: 5/5 Hamstring: 4/5 Hip flexor: 4/5 Hip abductors: 4/5  Foot exam shows the patient does have severe over pronation of the hindfoot the left side. Patient is wearing a Cam Walker boot on the right. Significant  pes planus of the left foot. OMT Physical Exam    Standing flexion right  Seated Flexion right  Cervical  Neutral Thoracic T3 extended rotated inside that right T4 extended rotated and side bent left Group curve to the right T8 extended rotated and side bent left  Lumbar L1 flexed rotated  and side bent right  Sacrum Left on left         Impression and Recommendations:     This case required medical decision making of moderate complexity.

## 2014-06-18 ENCOUNTER — Encounter (HOSPITAL_COMMUNITY)
Admission: RE | Admit: 2014-06-18 | Discharge: 2014-06-18 | Disposition: A | Discharge status: Home / Self Care | Payer: Managed Care, Other (non HMO) | Source: Ambulatory Visit | Attending: Nephrology | Admitting: Nephrology

## 2014-06-18 VITALS — BP 136/86 | HR 97 | Temp 98.6°F | Resp 18

## 2014-06-18 DIAGNOSIS — D638 Anemia in other chronic diseases classified elsewhere: Secondary | ICD-10-CM | POA: Diagnosis present

## 2014-06-18 DIAGNOSIS — N182 Chronic kidney disease, stage 2 (mild): Secondary | ICD-10-CM

## 2014-06-18 DIAGNOSIS — N183 Chronic kidney disease, stage 3 unspecified: Secondary | ICD-10-CM | POA: Insufficient documentation

## 2014-06-18 LAB — IRON AND TIBC
Iron: 40 ug/dL — ABNORMAL LOW (ref 42–135)
Saturation Ratios: 16 % — ABNORMAL LOW (ref 20–55)
TIBC: 253 ug/dL (ref 250–470)
UIBC: 213 ug/dL (ref 125–400)

## 2014-06-18 LAB — POCT HEMOGLOBIN-HEMACUE: HEMOGLOBIN: 10.2 g/dL — AB (ref 12.0–15.0)

## 2014-06-18 LAB — FERRITIN: Ferritin: 45 ng/mL (ref 10–291)

## 2014-06-18 MED ORDER — EPOETIN ALFA 40000 UNIT/ML IJ SOLN: 30000.0000 [IU] | INTRAMUSCULAR | Status: DC

## 2014-06-18 MED ORDER — EPOETIN ALFA 20000 UNIT/ML IJ SOLN
INTRAMUSCULAR | Status: AC
  Administered 2014-06-18: 20000 [IU] via SUBCUTANEOUS
  Filled 2014-06-18: qty 1

## 2014-06-18 MED ORDER — EPOETIN ALFA 10000 UNIT/ML IJ SOLN
INTRAMUSCULAR | Status: AC
  Administered 2014-06-18: 10000 [IU] via SUBCUTANEOUS
  Filled 2014-06-18: qty 1

## 2014-06-26 ENCOUNTER — Encounter (HOSPITAL_BASED_OUTPATIENT_CLINIC_OR_DEPARTMENT_OTHER): Payer: Managed Care, Other (non HMO) | Attending: General Surgery

## 2014-06-26 DIAGNOSIS — E1169 Type 2 diabetes mellitus with other specified complication: Secondary | ICD-10-CM | POA: Insufficient documentation

## 2014-06-26 DIAGNOSIS — L97509 Non-pressure chronic ulcer of other part of unspecified foot with unspecified severity: Secondary | ICD-10-CM | POA: Insufficient documentation

## 2014-07-01 ENCOUNTER — Other Ambulatory Visit (HOSPITAL_COMMUNITY): Payer: Self-pay | Admitting: *Deleted

## 2014-07-02 ENCOUNTER — Encounter (HOSPITAL_COMMUNITY)
Admission: RE | Admit: 2014-07-02 | Discharge: 2014-07-02 | Disposition: A | Discharge status: Home / Self Care | Payer: Managed Care, Other (non HMO) | Source: Ambulatory Visit | Attending: Nephrology | Admitting: Nephrology

## 2014-07-02 VITALS — BP 151/89 | HR 85 | Temp 97.7°F | Resp 20

## 2014-07-02 DIAGNOSIS — N182 Chronic kidney disease, stage 2 (mild): Secondary | ICD-10-CM | POA: Insufficient documentation

## 2014-07-02 LAB — POCT HEMOGLOBIN-HEMACUE: HEMOGLOBIN: 11 g/dL — AB (ref 12.0–15.0)

## 2014-07-02 MED ORDER — EPOETIN ALFA 40000 UNIT/ML IJ SOLN: 30000.0000 [IU] | INTRAMUSCULAR | Status: DC

## 2014-07-02 MED ORDER — SODIUM CHLORIDE 0.9 % IV SOLN
1020.0000 mg | Freq: Once | INTRAVENOUS | Status: AC
  Administered 2014-07-02: 1020 mg via INTRAVENOUS
  Filled 2014-07-02: qty 34

## 2014-07-02 MED ORDER — EPOETIN ALFA 10000 UNIT/ML IJ SOLN
INTRAMUSCULAR | Status: AC
  Administered 2014-07-02: 10000 [IU] via SUBCUTANEOUS
  Filled 2014-07-02: qty 1

## 2014-07-02 MED ORDER — EPOETIN ALFA 20000 UNIT/ML IJ SOLN
INTRAMUSCULAR | Status: AC
  Administered 2014-07-02: 20000 [IU] via SUBCUTANEOUS
  Filled 2014-07-02: qty 1

## 2014-07-11 ENCOUNTER — Other Ambulatory Visit: Payer: Self-pay | Admitting: Nurse Practitioner

## 2014-07-11 NOTE — Telephone Encounter (Signed)
Prescription for lasix had to go to kidney doc-Dr Deterding.

## 2014-07-11 NOTE — Telephone Encounter (Signed)
Patient requesting a CB, she has a question for [place].

## 2014-07-11 NOTE — Telephone Encounter (Signed)
LMOVM for pt to return call 

## 2014-07-11 NOTE — Telephone Encounter (Signed)
Patient returned call. Patient is requesting refill on Lasix 80 mg. Patient is also requesting handicap sticker (form) be filled out for her. Please advise?

## 2014-07-12 ENCOUNTER — Telehealth: Payer: Self-pay | Admitting: Nurse Practitioner

## 2014-07-12 ENCOUNTER — Ambulatory Visit: Payer: Managed Care, Other (non HMO) | Admitting: Family Medicine

## 2014-07-12 DIAGNOSIS — Z0289 Encounter for other administrative examinations: Secondary | ICD-10-CM

## 2014-07-12 NOTE — Telephone Encounter (Signed)
Diabetic bundle LDL & A1C. Patient will CB to schedule appt

## 2014-07-15 ENCOUNTER — Telehealth: Payer: Self-pay

## 2014-07-15 NOTE — Telephone Encounter (Signed)
Spoke with pt, advised she would have to get Rx from Dr Jimmy Footman. Pt understood.

## 2014-07-15 NOTE — Telephone Encounter (Signed)
Spoke with pt, she would like a refill on her Lasix. It looks like we never prescribed this for pt. Is this ok to send in?

## 2014-07-15 NOTE — Telephone Encounter (Signed)
She has to get lasix from Dr Jimmy Footman. I sent Sharyn Lull a note about this last week. Do you see any correspondence?

## 2014-07-16 ENCOUNTER — Encounter (HOSPITAL_COMMUNITY)
Admission: RE | Admit: 2014-07-16 | Discharge: 2014-07-16 | Disposition: A | Discharge status: Home / Self Care | Payer: Managed Care, Other (non HMO) | Source: Ambulatory Visit | Attending: Nephrology | Admitting: Nephrology

## 2014-07-16 VITALS — BP 172/93 | HR 90 | Temp 98.0°F

## 2014-07-16 DIAGNOSIS — N182 Chronic kidney disease, stage 2 (mild): Secondary | ICD-10-CM

## 2014-07-16 LAB — IRON AND TIBC
Iron: 75 ug/dL (ref 42–135)
Saturation Ratios: 31 % (ref 20–55)
TIBC: 245 ug/dL — AB (ref 250–470)
UIBC: 170 ug/dL (ref 125–400)

## 2014-07-16 LAB — POCT HEMOGLOBIN-HEMACUE: Hemoglobin: 11.5 g/dL — ABNORMAL LOW (ref 12.0–15.0)

## 2014-07-16 LAB — FERRITIN: Ferritin: 630 ng/mL — ABNORMAL HIGH (ref 10–291)

## 2014-07-16 MED ORDER — EPOETIN ALFA 40000 UNIT/ML IJ SOLN: 30000.0000 [IU] | INTRAMUSCULAR | Status: DC

## 2014-07-16 MED ORDER — EPOETIN ALFA 10000 UNIT/ML IJ SOLN
INTRAMUSCULAR | Status: AC
  Administered 2014-07-16: 10000 [IU] via SUBCUTANEOUS
  Filled 2014-07-16: qty 1

## 2014-07-16 MED ORDER — EPOETIN ALFA 20000 UNIT/ML IJ SOLN
INTRAMUSCULAR | Status: AC
  Administered 2014-07-16: 20000 [IU] via SUBCUTANEOUS
  Filled 2014-07-16: qty 1

## 2014-07-16 NOTE — Telephone Encounter (Signed)
LMOVM (office) for pt to return call.

## 2014-07-16 NOTE — Telephone Encounter (Signed)
Patient notified. Patient stated that she has already contacted Dr. Deterding's office about Lasix

## 2014-07-26 ENCOUNTER — Telehealth: Payer: Self-pay | Admitting: *Deleted

## 2014-07-26 NOTE — Telephone Encounter (Signed)
LMOVM for pt to return call. Patient needs diabetes f/u appt, if pt has not seen Dr. Chalmers Cater from endo. Patient needs to bring 3 fasting and 3 after supper glucose readings to her ov.

## 2014-07-29 ENCOUNTER — Emergency Department (HOSPITAL_COMMUNITY)
Admission: EM | Admit: 2014-07-29 | Discharge: 2014-07-30 | Disposition: A | Discharge status: Home / Self Care | Payer: Managed Care, Other (non HMO) | Attending: Emergency Medicine | Admitting: Emergency Medicine

## 2014-07-29 ENCOUNTER — Encounter (HOSPITAL_COMMUNITY): Payer: Self-pay | Admitting: Emergency Medicine

## 2014-07-29 ENCOUNTER — Emergency Department (HOSPITAL_COMMUNITY): Payer: Managed Care, Other (non HMO)

## 2014-07-29 DIAGNOSIS — IMO0001 Reserved for inherently not codable concepts without codable children: Secondary | ICD-10-CM | POA: Insufficient documentation

## 2014-07-29 DIAGNOSIS — R739 Hyperglycemia, unspecified: Secondary | ICD-10-CM

## 2014-07-29 DIAGNOSIS — Z79899 Other long term (current) drug therapy: Secondary | ICD-10-CM | POA: Insufficient documentation

## 2014-07-29 DIAGNOSIS — R51 Headache: Secondary | ICD-10-CM | POA: Insufficient documentation

## 2014-07-29 DIAGNOSIS — Z8719 Personal history of other diseases of the digestive system: Secondary | ICD-10-CM | POA: Insufficient documentation

## 2014-07-29 DIAGNOSIS — Z794 Long term (current) use of insulin: Secondary | ICD-10-CM | POA: Insufficient documentation

## 2014-07-29 DIAGNOSIS — J029 Acute pharyngitis, unspecified: Secondary | ICD-10-CM | POA: Insufficient documentation

## 2014-07-29 DIAGNOSIS — R42 Dizziness and giddiness: Secondary | ICD-10-CM | POA: Insufficient documentation

## 2014-07-29 DIAGNOSIS — Z87440 Personal history of urinary (tract) infections: Secondary | ICD-10-CM | POA: Insufficient documentation

## 2014-07-29 DIAGNOSIS — E119 Type 2 diabetes mellitus without complications: Secondary | ICD-10-CM | POA: Insufficient documentation

## 2014-07-29 DIAGNOSIS — N189 Chronic kidney disease, unspecified: Secondary | ICD-10-CM | POA: Insufficient documentation

## 2014-07-29 DIAGNOSIS — I129 Hypertensive chronic kidney disease with stage 1 through stage 4 chronic kidney disease, or unspecified chronic kidney disease: Secondary | ICD-10-CM | POA: Insufficient documentation

## 2014-07-29 DIAGNOSIS — Z8742 Personal history of other diseases of the female genital tract: Secondary | ICD-10-CM | POA: Insufficient documentation

## 2014-07-29 DIAGNOSIS — Z862 Personal history of diseases of the blood and blood-forming organs and certain disorders involving the immune mechanism: Secondary | ICD-10-CM | POA: Insufficient documentation

## 2014-07-29 LAB — CBC WITH DIFFERENTIAL/PLATELET
Basophils Absolute: 0 10*3/uL (ref 0.0–0.1)
Basophils Relative: 0 % (ref 0–1)
EOS ABS: 0.1 10*3/uL (ref 0.0–0.7)
EOS PCT: 2 % (ref 0–5)
HEMATOCRIT: 41.8 % (ref 36.0–46.0)
HEMOGLOBIN: 13.6 g/dL (ref 12.0–15.0)
LYMPHS ABS: 1.7 10*3/uL (ref 0.7–4.0)
Lymphocytes Relative: 31 % (ref 12–46)
MCH: 28.6 pg (ref 26.0–34.0)
MCHC: 32.5 g/dL (ref 30.0–36.0)
MCV: 87.8 fL (ref 78.0–100.0)
Monocytes Absolute: 0.4 10*3/uL (ref 0.1–1.0)
Monocytes Relative: 8 % (ref 3–12)
Neutro Abs: 3.3 10*3/uL (ref 1.7–7.7)
Neutrophils Relative %: 59 % (ref 43–77)
PLATELETS: 217 10*3/uL (ref 150–400)
RBC: 4.76 MIL/uL (ref 3.87–5.11)
RDW: 14.6 % (ref 11.5–15.5)
WBC: 5.6 10*3/uL (ref 4.0–10.5)

## 2014-07-29 LAB — URINALYSIS, ROUTINE W REFLEX MICROSCOPIC
Bilirubin Urine: NEGATIVE
Ketones, ur: NEGATIVE mg/dL
LEUKOCYTES UA: NEGATIVE
Nitrite: NEGATIVE
Protein, ur: >300 mg/dL — AB
SPECIFIC GRAVITY, URINE: 1.021 (ref 1.005–1.030)
UROBILINOGEN UA: 0.2 mg/dL (ref 0.0–1.0)
pH: 6 (ref 5.0–8.0)

## 2014-07-29 LAB — COMPREHENSIVE METABOLIC PANEL
ALBUMIN: 3.7 g/dL (ref 3.5–5.2)
ALT: 27 U/L (ref 0–35)
AST: 22 U/L (ref 0–37)
Alkaline Phosphatase: 136 U/L — ABNORMAL HIGH (ref 39–117)
Anion gap: 16 — ABNORMAL HIGH (ref 5–15)
BUN: 28 mg/dL — ABNORMAL HIGH (ref 6–23)
CALCIUM: 9.4 mg/dL (ref 8.4–10.5)
CO2: 20 mEq/L (ref 19–32)
Chloride: 91 mEq/L — ABNORMAL LOW (ref 96–112)
Creatinine, Ser: 2.84 mg/dL — ABNORMAL HIGH (ref 0.50–1.10)
GFR calc non Af Amer: 19 mL/min — ABNORMAL LOW (ref >90)
GFR, EST AFRICAN AMERICAN: 22 mL/min — AB (ref >90)
GLUCOSE: 524 mg/dL — AB (ref 70–99)
Potassium: 4.5 mEq/L (ref 3.7–5.3)
Sodium: 127 mEq/L — ABNORMAL LOW (ref 137–147)
TOTAL PROTEIN: 9.3 g/dL — AB (ref 6.0–8.3)
Total Bilirubin: 0.3 mg/dL (ref 0.3–1.2)

## 2014-07-29 LAB — I-STAT CHEM 8, ED
BUN: 45 mg/dL — ABNORMAL HIGH (ref 6–23)
Calcium, Ion: 1.08 mmol/L — ABNORMAL LOW (ref 1.12–1.23)
Chloride: 99 mEq/L (ref 96–112)
Creatinine, Ser: 3 mg/dL — ABNORMAL HIGH (ref 0.50–1.10)
Glucose, Bld: 531 mg/dL — ABNORMAL HIGH (ref 70–99)
HEMATOCRIT: 48 % — AB (ref 36.0–46.0)
HEMOGLOBIN: 16.3 g/dL — AB (ref 12.0–15.0)
POTASSIUM: 5.8 meq/L — AB (ref 3.7–5.3)
SODIUM: 129 meq/L — AB (ref 137–147)
TCO2: 23 mmol/L (ref 0–100)

## 2014-07-29 LAB — CBG MONITORING, ED
Glucose-Capillary: 543 mg/dL — ABNORMAL HIGH (ref 70–99)
Glucose-Capillary: 505 mg/dL — ABNORMAL HIGH (ref 70–99)

## 2014-07-29 LAB — URINE MICROSCOPIC-ADD ON

## 2014-07-29 LAB — LIPASE, BLOOD: Lipase: 104 U/L — ABNORMAL HIGH (ref 11–59)

## 2014-07-29 MED ORDER — SODIUM CHLORIDE 0.9 % IV SOLN
1000.0000 mL | Freq: Once | INTRAVENOUS | Status: AC
  Administered 2014-07-30: 1000 mL via INTRAVENOUS

## 2014-07-29 MED ORDER — SODIUM CHLORIDE 0.9 % IV BOLUS (SEPSIS)
1000.0000 mL | INTRAVENOUS | Status: AC
  Administered 2014-07-29: 1000 mL via INTRAVENOUS

## 2014-07-29 MED ORDER — DIPHENHYDRAMINE HCL 50 MG/ML IJ SOLN
25.0000 mg | Freq: Once | INTRAMUSCULAR | Status: AC
  Administered 2014-07-29: 25 mg via INTRAVENOUS
  Filled 2014-07-29: qty 1

## 2014-07-29 MED ORDER — SODIUM CHLORIDE 0.9 % IV SOLN
INTRAVENOUS | Status: DC
  Administered 2014-07-30: 3.9 [IU]/h via INTRAVENOUS
  Filled 2014-07-29: qty 1

## 2014-07-29 MED ORDER — TRAMADOL HCL 50 MG PO TABS
50.0000 mg | ORAL_TABLET | Freq: Once | ORAL | Status: DC
  Filled 2014-07-29: qty 1

## 2014-07-29 MED ORDER — SODIUM CHLORIDE 0.9 % IV SOLN
1000.0000 mL | INTRAVENOUS | Status: DC
  Administered 2014-07-30: 1000 mL via INTRAVENOUS

## 2014-07-29 MED ORDER — HYDROMORPHONE HCL PF 1 MG/ML IJ SOLN
0.5000 mg | Freq: Once | INTRAMUSCULAR | Status: AC
  Administered 2014-07-29: 0.5 mg via INTRAVENOUS
  Filled 2014-07-29: qty 1

## 2014-07-29 MED ORDER — DEXTROSE-NACL 5-0.45 % IV SOLN
INTRAVENOUS | Status: DC
  Administered 2014-07-30: 03:00:00 via INTRAVENOUS

## 2014-07-29 NOTE — Telephone Encounter (Signed)
Pt called and left a message stating she just checked her BS and it was over 600. Pt states she has not been feeling well these past couple of days and wants to know what to do? Please advise.

## 2014-07-29 NOTE — ED Notes (Signed)
Pt c/o headache, sore throat, dizziness since Thursday and high blood sugar today.

## 2014-07-29 NOTE — Telephone Encounter (Signed)
Spoke w/pt: feeling bad since last Thurs. ( 5 da). Blood sugar in 500's last 2 days. Advised go to hospital for fluids & insulin,&  blood work-may have infection.

## 2014-07-29 NOTE — ED Notes (Signed)
MD at bedside. 

## 2014-07-29 NOTE — ED Provider Notes (Signed)
CSN: [id number]     Arrival date & time 07/29/14  1634 History   First MD Initiated Contact with Patient 07/29/14 2029     Chief Complaint  Patient presents with  . Hyperglycemia  . Headache  . Dizziness  . Sore Throat     (Consider location/radiation/quality/duration/timing/severity/associated sxs/prior Treatment) HPI Comments: 43 year old female with a history of diabetes who presents with a complaint of headache, sore throat and dizziness. She has had several days of symptoms, they're gradually worsening and now associated with shortness of breath and a cough. She denies fevers chills but has had nausea. She states that she has been taking her blood sugar at home and it is. Unreadable high on her home monitor. She has frequent urination, polydipsia, denies dysuria. She denies missing any of her medications and is compliant with her insulin regimen according to the patient's report.  Patient is a 43 y.o. female presenting with hyperglycemia, headaches, dizziness, and pharyngitis. The history is provided by the patient.  Hyperglycemia Associated symptoms: dizziness   Headache Associated symptoms: dizziness   Dizziness Associated symptoms: headaches   Sore Throat Associated symptoms include headaches.    Past Medical History  Diagnosis Date  . Gastroparesis   . Anemia   . IBS (irritable bowel syndrome)   . Nausea   . Hx: UTI (urinary tract infection)   . Fibroids     leiomyoma 8/04 per [name]  . Fibromyalgia   . Diabetes mellitus   . Hypertension   . Chronic kidney disease   . Allergy   . Blood transfusion without reported diagnosis   . Infertility     eval at [facility]   Past Surgical History  Procedure Laterality Date  . Eye surgery      bilateral, vitrectomies  . Fibroid tumors removed    . Foot surgery      bilateral   Family History  Problem Relation Age of Onset  . Colon polyps Mother   . Diabetes Mother   . Anesthesia problems Neg Hx   . Hypotension Neg Hx   .  Malignant hyperthermia Neg Hx   . Pseudochol deficiency Neg Hx   . Heart murmur Father   . Hypertension Father   . Diabetes Sister   . Kidney failure Sister   . Diabetes Brother   . Retinal degeneration Brother   . Heart murmur Son   . Stroke Paternal Grandmother   . Diabetes Sister    History  Substance Use Topics  . Smoking status: Never Smoker   . Smokeless tobacco: Never Used  . Alcohol Use: No   OB History   Grav Para Term Preterm Abortions TAB SAB Ect Mult Living   2 1   1  1   1      Review of Systems  Neurological: Positive for dizziness and headaches.  All other systems reviewed and are negative.     Allergies  Ibuprofen and Dilaudid  Home Medications   Prior to Admission medications   Medication Sig Start Date End Date Taking? Authorizing Provider  ACCU-CHEK SMARTVIEW test strip  09/07/13  Yes Historical Provider, MD  amLODipine (NORVASC) 10 MG tablet Take 20 mg by mouth at bedtime.  09/10/13  Yes Historical Provider, MD  calcitRIOL (ROCALTROL) 0.25 MCG capsule Take 0.5 mcg by mouth at bedtime.    Yes Historical Provider, MD  epoetin alfa (EPOGEN,PROCRIT) 02725 UNIT/ML injection 30,000 Units every 14 (fourteen) days.    Yes Historical Provider, MD  ferumoxytol Shirlean Kelly) 510  MG/17ML SOLN injection Inject 1,020 mg into the vein once.   Yes Historical Provider, MD  furosemide (LASIX) 80 MG tablet Take 160 mg by mouth 2 (two) times daily. 04/23/14  Yes Maryann Mikhail, DO  insulin aspart (NOVOLOG) 100 UNIT/ML injection Inject 0-15 Units into the skin 3 (three) times daily with meals. 04/23/14  Yes Maryann Mikhail, DO  insulin detemir (LEVEMIR) 100 UNIT/ML injection Inject 25 Units into the skin at bedtime.   Yes Historical Provider, MD  lisinopril (PRINIVIL,ZESTRIL) 20 MG tablet Take 20 mg by mouth daily.  04/05/14  Yes Historical Provider, MD  pregabalin (LYRICA) 150 MG capsule Take 300 mg by mouth at bedtime.    Yes Historical Provider, MD  b complex-vitamin c-folic  acid (NEPHRO-VITE) 0.8 MG TABS tablet Take 1 tablet by mouth at bedtime. 05/02/14   Irene Pap, NP  cyclobenzaprine (FLEXERIL) 10 MG tablet Take 1 tablet (10 mg total) by mouth 3 (three) times daily as needed for muscle spasms. 03/18/14   Charles B. Karle Starch, MD   BP 178/94  Pulse 95  Temp(Src) 98 F (36.7 C) (Oral)  Resp 22  SpO2 98%  LMP 07/22/2014 Physical Exam  Nursing note and vitals reviewed. Constitutional: She appears well-developed and well-nourished. No distress.  HENT:  Head: Normocephalic and atraumatic.  Mouth/Throat: Oropharynx is clear and moist. No oropharyngeal exudate.  Eyes: Conjunctivae and EOM are normal. Pupils are equal, round, and reactive to light. Right eye exhibits no discharge. Left eye exhibits no discharge. No scleral icterus.  Neck: Normal range of motion. Neck supple. No JVD present. No thyromegaly present.  Cardiovascular: Normal rate, regular rhythm, normal heart sounds and intact distal pulses.  Exam reveals no gallop and no friction rub.   No murmur heard. Pulmonary/Chest: Effort normal and breath sounds normal. No respiratory distress. She has no wheezes. She has no rales.  Abdominal: Soft. Bowel sounds are normal. She exhibits no distension and no mass. There is no tenderness.  Musculoskeletal: Normal range of motion. She exhibits no edema and no tenderness.  Lymphadenopathy:    She has no cervical adenopathy.  Neurological: [name] alert. Coordination normal.  Skin: Skin is warm and dry. No rash noted. No erythema.  Psychiatric: She has a normal mood and affect. Her behavior is normal.    ED Course  Procedures (including critical care time) Labs Review Labs Reviewed  COMPREHENSIVE METABOLIC PANEL - Abnormal; Notable for the following:    Sodium 127 (*)    Chloride 91 (*)    Glucose, Bld 524 (*)    BUN 28 (*)    Creatinine, Ser 2.84 (*)    Total Protein 9.3 (*)    Alkaline Phosphatase 136 (*)    GFR calc non Af Amer 19 (*)    GFR calc Af  Amer 22 (*)    Anion gap 16 (*)    All other components within normal limits  LIPASE, BLOOD - Abnormal; Notable for the following:    Lipase 104 (*)    All other components within normal limits  URINALYSIS, ROUTINE W REFLEX MICROSCOPIC - Abnormal; Notable for the following:    Glucose, UA >1000 (*)    Hgb urine dipstick SMALL (*)    Protein, ur >300 (*)    All other components within normal limits  CBG MONITORING, ED - Abnormal; Notable for the following:    Glucose-Capillary 543 (*)    All other components within normal limits  CBG MONITORING, ED - Abnormal; Notable for  the following:    Glucose-Capillary 505 (*)    All other components within normal limits  I-STAT CHEM 8, ED - Abnormal; Notable for the following:    Sodium 129 (*)    Potassium 5.8 (*)    BUN 45 (*)    Creatinine, Ser 3.00 (*)    Glucose, Bld 531 (*)    Calcium, Ion 1.08 (*)    Hemoglobin 16.3 (*)    HCT 48.0 (*)    All other components within normal limits  CBG MONITORING, ED - Abnormal; Notable for the following:    Glucose-Capillary 454 (*)    All other components within normal limits  CBC WITH DIFFERENTIAL  URINE MICROSCOPIC-ADD ON    Imaging Review Dg Chest Port 1 View  07/29/2014   CLINICAL DATA:  Chest pain and shortness of breath.  Cough.  EXAM: PORTABLE CHEST - 1 VIEW  COMPARISON:  04/18/2014  FINDINGS: Normal heart size and mediastinal contours. No acute infiltrate or edema. No effusion or pneumothorax. No acute osseous findings. Mild thoracic levoscoliosis.  IMPRESSION: No active disease.   Electronically Signed   By: Jorje Guild M.D.   On: 07/29/2014 21:26      MDM   Final diagnoses:  None    The patient is not tachycardic, has a soft abdomen, no signs of rash, no signs of acute infection. Her symptoms require evaluation for possible diabetic ketoacidosis including urinalysis, lab work, IV fluids and insulin.  Laboratory workup shows no signs of significant acidosis, normal CO2, slight  hyponatremia, significant hyperglycemia which will require insulin drip. The patient has no tachycardia hypotension hypoxia and at this time has improved significantly with IV fluids. Her blood sugar is trending downwards, she will need ongoing treatment for the next couple of hours, change of shift, care signed out to oncoming physician Dr. Phylliss Blakes, MD 07/30/14 [phone]

## 2014-07-30 ENCOUNTER — Encounter (HOSPITAL_COMMUNITY): Payer: Managed Care, Other (non HMO)

## 2014-07-30 LAB — BASIC METABOLIC PANEL
ANION GAP: 13 (ref 5–15)
BUN: 27 mg/dL — ABNORMAL HIGH (ref 6–23)
CO2: 21 mEq/L (ref 19–32)
Calcium: 8.7 mg/dL (ref 8.4–10.5)
Chloride: 99 mEq/L (ref 96–112)
Creatinine, Ser: 2.81 mg/dL — ABNORMAL HIGH (ref 0.50–1.10)
GFR calc Af Amer: 22 mL/min — ABNORMAL LOW (ref >90)
GFR, EST NON AFRICAN AMERICAN: 19 mL/min — AB (ref >90)
GLUCOSE: 323 mg/dL — AB (ref 70–99)
Potassium: 3.9 mEq/L (ref 3.7–5.3)
SODIUM: 133 meq/L — AB (ref 137–147)

## 2014-07-30 LAB — CBG MONITORING, ED
GLUCOSE-CAPILLARY: 405 mg/dL — AB (ref 70–99)
GLUCOSE-CAPILLARY: 454 mg/dL — AB (ref 70–99)
Glucose-Capillary: 337 mg/dL — ABNORMAL HIGH (ref 70–99)
Glucose-Capillary: 229 mg/dL — ABNORMAL HIGH (ref 70–99)

## 2014-07-30 MED ORDER — HYDROCODONE-ACETAMINOPHEN 5-325 MG PO TABS: 1.0000 | ORAL_TABLET | Freq: Four times a day (QID) | ORAL | Status: DC | PRN

## 2014-07-30 MED ORDER — FENTANYL CITRATE 0.05 MG/ML IJ SOLN
50.0000 ug | Freq: Once | INTRAMUSCULAR | Status: AC
  Administered 2014-07-30: 50 ug via INTRAVENOUS
  Filled 2014-07-30: qty 2

## 2014-07-30 MED ORDER — HYDROXYZINE HCL 25 MG PO TABS
25.0000 mg | ORAL_TABLET | Freq: Once | ORAL | Status: AC
  Administered 2014-07-30: 25 mg via ORAL
  Filled 2014-07-30: qty 1

## 2014-07-30 NOTE — Discharge Instructions (Signed)

## 2014-07-30 NOTE — ED Provider Notes (Signed)
Results for orders placed during the hospital encounter of 07/29/14  CBC WITH DIFFERENTIAL      Result Value Ref Range   WBC 5.6  4.0 - 10.5 K/uL   RBC 4.76  3.87 - 5.11 MIL/uL   Hemoglobin 13.6  12.0 - 15.0 g/dL   HCT 41.8  36.0 - 46.0 %   MCV 87.8  78.0 - 100.0 fL   MCH 28.6  26.0 - 34.0 pg   MCHC 32.5  30.0 - 36.0 g/dL   RDW 14.6  11.5 - 15.5 %   Platelets 217  150 - 400 K/uL   Neutrophils Relative % 59  43 - 77 %   Neutro Abs 3.3  1.7 - 7.7 K/uL   Lymphocytes Relative 31  12 - 46 %   Lymphs Abs 1.7  0.7 - 4.0 K/uL   Monocytes Relative 8  3 - 12 %   Monocytes Absolute 0.4  0.1 - 1.0 K/uL   Eosinophils Relative 2  0 - 5 %   Eosinophils Absolute 0.1  0.0 - 0.7 K/uL   Basophils Relative 0  0 - 1 %   Basophils Absolute 0.0  0.0 - 0.1 K/uL  COMPREHENSIVE METABOLIC PANEL      Result Value Ref Range   Sodium 127 (*) 137 - 147 mEq/L   Potassium 4.5  3.7 - 5.3 mEq/L   Chloride 91 (*) 96 - 112 mEq/L   CO2 20  19 - 32 mEq/L   Glucose, Bld 524 (*) 70 - 99 mg/dL   BUN 28 (*) 6 - 23 mg/dL   Creatinine, Ser 2.84 (*) 0.50 - 1.10 mg/dL   Calcium 9.4  8.4 - 10.5 mg/dL   Total Protein 9.3 (*) 6.0 - 8.3 g/dL   Albumin 3.7  3.5 - 5.2 g/dL   AST 22  0 - 37 U/L   ALT 27  0 - 35 U/L   Alkaline Phosphatase 136 (*) 39 - 117 U/L   Total Bilirubin 0.3  0.3 - 1.2 mg/dL   GFR calc non Af Amer 19 (*) >90 mL/min   GFR calc Af Amer 22 (*) >90 mL/min   Anion gap 16 (*) 5 - 15  LIPASE, BLOOD      Result Value Ref Range   Lipase 104 (*) 11 - 59 U/L  URINALYSIS, ROUTINE W REFLEX MICROSCOPIC      Result Value Ref Range   Color, Urine YELLOW  YELLOW   APPearance CLEAR  CLEAR   Specific Gravity, Urine 1.021  1.005 - 1.030   pH 6.0  5.0 - 8.0   Glucose, UA >1000 (*) NEGATIVE mg/dL   Hgb urine dipstick SMALL (*) NEGATIVE   Bilirubin Urine NEGATIVE  NEGATIVE   Ketones, ur NEGATIVE  NEGATIVE mg/dL   Protein, ur >300 (*) NEGATIVE mg/dL   Urobilinogen, UA 0.2  0.0 - 1.0 mg/dL   Nitrite NEGATIVE   NEGATIVE   Leukocytes, UA NEGATIVE  NEGATIVE  URINE MICROSCOPIC-ADD ON      Result Value Ref Range   Squamous Epithelial / LPF RARE  RARE   WBC, UA 0-2  <3 WBC/hpf   RBC / HPF 0-2  <3 RBC/hpf   Bacteria, UA RARE  RARE  BASIC METABOLIC PANEL      Result Value Ref Range   Sodium 133 (*) 137 - 147 mEq/L   Potassium 3.9  3.7 - 5.3 mEq/L   Chloride 99  96 - 112 mEq/L   CO2 21  19 - 32 mEq/L   Glucose, Bld 323 (*) 70 - 99 mg/dL   BUN 27 (*) 6 - 23 mg/dL   Creatinine, Ser 2.81 (*) 0.50 - 1.10 mg/dL   Calcium 8.7  8.4 - 10.5 mg/dL   GFR calc non Af Amer 19 (*) >90 mL/min   GFR calc Af Amer 22 (*) >90 mL/min   Anion gap 13  5 - 15  CBG MONITORING, ED      Result Value Ref Range   Glucose-Capillary 543 (*) 70 - 99 mg/dL   Comment 1 Documented in Chart    CBG MONITORING, ED      Result Value Ref Range   Glucose-Capillary 505 (*) 70 - 99 mg/dL   Comment 1 Documented in Chart     Comment 2 Notify RN    I-STAT CHEM 8, ED      Result Value Ref Range   Sodium 129 (*) 137 - 147 mEq/L   Potassium 5.8 (*) 3.7 - 5.3 mEq/L   Chloride 99  96 - 112 mEq/L   BUN 45 (*) 6 - 23 mg/dL   Creatinine, Ser 3.00 (*) 0.50 - 1.10 mg/dL   Glucose, Bld 531 (*) 70 - 99 mg/dL   Calcium, Ion 1.08 (*) 1.12 - 1.23 mmol/L   TCO2 23  0 - 100 mmol/L   Hemoglobin 16.3 (*) 12.0 - 15.0 g/dL   HCT 48.0 (*) 36.0 - 46.0 %  CBG MONITORING, ED      Result Value Ref Range   Glucose-Capillary 454 (*) 70 - 99 mg/dL  CBG MONITORING, ED      Result Value Ref Range   Glucose-Capillary 405 (*) 70 - 99 mg/dL  CBG MONITORING, ED      Result Value Ref Range   Glucose-Capillary 337 (*) 70 - 99 mg/dL   Comment 1 Notify RN    CBG MONITORING, ED      Result Value Ref Range   Glucose-Capillary 229 (*) 70 - 99 mg/dL   Improvement in blood sugar in ER.  Multitude of symptoms seem more viral in nature.  Discharge home with PCP followup.  She understands to return to the ER for new or worsening symptoms  Hoy Morn,  MD 07/30/14 [phone]

## 2014-07-30 NOTE — ED Notes (Signed)
Patient and spouse given note for work.

## 2014-07-30 NOTE — ED Notes (Signed)
Patient is requesting medication for cough, itching, and pain. Dr Venora Maples notified, will monitor for orders.

## 2014-08-01 ENCOUNTER — Telehealth: Payer: Self-pay | Admitting: Nurse Practitioner

## 2014-08-01 NOTE — Telephone Encounter (Signed)
Patient scheduled appt for 08/12/2014. Patient wanted to know if is ok to wait a week to be seen?

## 2014-08-01 NOTE — Telephone Encounter (Signed)
She can wait if her blood sugars are under 275. If they aren't, she needs to come in.

## 2014-08-01 NOTE — Telephone Encounter (Signed)
Patient stated that her BS has been over 275 lately. She was waiting for ov in a week because of her fiances. However, after pt was advised that she needs to come in ASAP, patient stated that she is going to check and see if she can come in sooner. Patient is going to cb and let us know.

## 2014-08-01 NOTE — Telephone Encounter (Signed)
pls call pt: Advise pls schedule OV ASAP. She needs to use sliding scale insulin plus lantus until I see her. SHe has a virus & that will cause sugars to run higher than usual. Sliding scale will tell her how much more regular insulin to take. Bring CBG machine with her.

## 2014-08-05 ENCOUNTER — Telehealth: Payer: Self-pay | Admitting: Nurse Practitioner

## 2014-08-05 DIAGNOSIS — E1165 Type 2 diabetes mellitus with hyperglycemia: Secondary | ICD-10-CM

## 2014-08-05 NOTE — Telephone Encounter (Signed)
Heather [name] has stopped using levemir because she thinks it is causing her severe nighttime leg cramping. She does not recall having leg cramps before she started Levemir. [name] using SSI 3 times daily. Her FBS are in 400's, 200's after using ssi. [name] willing to re-start Levemir at 15 u at lunchtime. She has enough to last until next appointment. She will continue to use SSI.  She stopped using Levemir once before due to cost. If too expensive, will stop SSI & switch back to 70/30.

## 2014-08-05 NOTE — Assessment & Plan Note (Signed)
For now restart levemir at 15u at lunch time. Continue SSI. F/u in 1 wk. Plan: use 69 insulin units daily (115kg X .6)= 34u glargine (lantus) qd, 11u aspart (novolog) before ea meal. Continue weight loss coaching. Daily walk or swim or bike-have to protect feet from skin break down.

## 2014-08-05 NOTE — Telephone Encounter (Signed)
Heather [name] stopped levemir due to severe leg cramping after taking it. [name] only using SSI-novolog. Pre-prandial sugars are in 400's, post-prandial in 200's. She had viral illness recently-still coughing-more when lays down. No CP or fever. Advised to start 15u levemir at lunch time. Continue SSI.  Get Neilmed sinus rinse, use daily. Has ov next week.

## 2014-08-09 ENCOUNTER — Inpatient Hospital Stay (HOSPITAL_COMMUNITY): Admission: RE | Admit: 2014-08-09 | Payer: Managed Care, Other (non HMO) | Source: Ambulatory Visit

## 2014-08-12 ENCOUNTER — Encounter: Payer: Self-pay | Admitting: Nurse Practitioner

## 2014-08-12 ENCOUNTER — Ambulatory Visit (INDEPENDENT_AMBULATORY_CARE_PROVIDER_SITE_OTHER): Payer: Managed Care, Other (non HMO) | Admitting: Nurse Practitioner

## 2014-08-12 VITALS — BP 101/69 | HR 100 | Temp 98.4°F | Ht 68.0 in | Wt 246.0 lb

## 2014-08-12 DIAGNOSIS — IMO0002 Reserved for concepts with insufficient information to code with codable children: Secondary | ICD-10-CM

## 2014-08-12 DIAGNOSIS — Z862 Personal history of diseases of the blood and blood-forming organs and certain disorders involving the immune mechanism: Secondary | ICD-10-CM

## 2014-08-12 DIAGNOSIS — E118 Type 2 diabetes mellitus with unspecified complications: Principal | ICD-10-CM

## 2014-08-12 DIAGNOSIS — Z8631 Personal history of diabetic foot ulcer: Secondary | ICD-10-CM

## 2014-08-12 DIAGNOSIS — E1165 Type 2 diabetes mellitus with hyperglycemia: Secondary | ICD-10-CM

## 2014-08-12 DIAGNOSIS — IMO0001 Reserved for inherently not codable concepts without codable children: Secondary | ICD-10-CM

## 2014-08-12 DIAGNOSIS — Z8639 Personal history of other endocrine, nutritional and metabolic disease: Secondary | ICD-10-CM

## 2014-08-12 MED ORDER — INSULIN DETEMIR 100 UNIT/ML ~~LOC~~ SOLN: 22.0000 [IU] | Freq: Every day | SUBCUTANEOUS | Status: DC

## 2014-08-12 NOTE — Progress Notes (Signed)
Pre visit review using our clinic review tool, if applicable. No additional management support is needed unless otherwise documented below in the visit note. 

## 2014-08-12 NOTE — Patient Instructions (Signed)
Increase levemir to 22 units at lunch time. Take 5 u novolog before each meal. For now, let's keep sugar between 200-300. Eventually, sugars goal will be between 150-250. We have to bring down over several weeks so you don't feel bad when sugar is normal.  Everyday Check blood sugar fasting -when you have not had food for 8 hours, AND check after you have eaten.  NO SODA! Try club soda or seltzer water.  Read labels, don't eat cereals or breads unless they have 4 grams fiber per serving. Try quinoa with vegetables in place of breads. When eating out, get vegetables or a salad.  Keep numbers at work. I will call in 2 weeks to see how much to adjust insulin.  NEVER walk barefooted, even in house.  Call Dr Sharol Given to schedule appointment to evaluate ankles.  Consider going to [facility] to be fitted for diabetic friendly shoes.

## 2014-08-14 ENCOUNTER — Encounter (HOSPITAL_COMMUNITY)
Admission: RE | Admit: 2014-08-14 | Discharge: 2014-08-14 | Disposition: A | Discharge status: Home / Self Care | Payer: Managed Care, Other (non HMO) | Source: Ambulatory Visit | Attending: Nephrology | Admitting: Nephrology

## 2014-08-14 VITALS — BP 166/99 | HR 99 | Temp 97.9°F | Resp 18

## 2014-08-14 DIAGNOSIS — N182 Chronic kidney disease, stage 2 (mild): Secondary | ICD-10-CM | POA: Diagnosis present

## 2014-08-14 DIAGNOSIS — Z8631 Personal history of diabetic foot ulcer: Secondary | ICD-10-CM | POA: Insufficient documentation

## 2014-08-14 LAB — IRON AND TIBC
Iron: 70 ug/dL (ref 42–135)
Saturation Ratios: 28 % (ref 20–55)
TIBC: 247 ug/dL — AB (ref 250–470)
UIBC: 177 ug/dL (ref 125–400)

## 2014-08-14 LAB — POCT HEMOGLOBIN-HEMACUE: HEMOGLOBIN: 11.7 g/dL — AB (ref 12.0–15.0)

## 2014-08-14 MED ORDER — EPOETIN ALFA 20000 UNIT/ML IJ SOLN
INTRAMUSCULAR | Status: AC
  Administered 2014-08-14: 20000 [IU] via SUBCUTANEOUS
  Filled 2014-08-14: qty 1

## 2014-08-14 MED ORDER — EPOETIN ALFA 10000 UNIT/ML IJ SOLN
INTRAMUSCULAR | Status: AC
  Administered 2014-08-14: 10000 [IU] via SUBCUTANEOUS
  Filled 2014-08-14: qty 1

## 2014-08-14 MED ORDER — EPOETIN ALFA 40000 UNIT/ML IJ SOLN: 30000.0000 [IU] | INTRAMUSCULAR | Status: DC

## 2014-08-14 NOTE — Telephone Encounter (Signed)
Patient OV 08/12/14

## 2014-08-14 NOTE — Assessment & Plan Note (Addendum)
For 1 week, she is using 15 u levemir at lunch time & 7-10u novolog tid. No more severe leg cramps at night as when taking 25 u levemir in evening. BS not controlled: FBS 285 or higher, prandial sugars as high as 400. She feels shaky when sugar is in below 200, so is reluctant to use SSI at times. ST Goal: BS 200-300. LT goal: BS 150-250. A1C <8.0. Last A1C 8.6. Increase levemir to 22u at lunchtime, novolog 5 u TID meals. Based on wt, she needs 33u levemir & 11 u novolog tid. Check FBS & 1 prandial daily. I will call in 2 weeks to get BS numbers.

## 2014-08-14 NOTE — Progress Notes (Signed)
Subjective:     Heather [name] is an 43 y.o. female who presents for follow up of diabetes. Current symptoms include: feels shaky when bs drops to 100's. Feels good when sugar in 200-300.[name] Home sugars: FBS over 285, prandial 300-400.. Current treatments: pt stopped levemir for a while because she was having severe cramps at night. Advised over phone, 1 wk ago to decrease levemir to 15u at lunch, cramps have resolved.  She had ER visit 2 weeks ago for BS over 500. She was hydrated, given IV insulin, d/c'd to home. Likely had viral illness causing sugar elevation: symptoms-cough, nasal congestion. She was treated for R foot ulcer few mos ago: debridement & cam walker. She was evaluated by Dr Tamala Julian (sports med) for back pain, who identified over-pronation & suggested hard sole shoe, possibly orthotics to correct. [place]ita has not been able to get new shoes. We had lengthy discussion about not walking barefoot. She verbalizes understanding.  The following portions of the patient's history were reviewed and updated as appropriate: allergies, current medications, past medical history, past social history, past surgical history and problem list.  Review of Systems Pertinent items are noted in HPI.    Objective:    BP 101/69  Pulse 100  Temp(Src) 98.4 F (36.9 C) (Temporal)  Ht 5\' 8"  (1.727 m)  Wt 246 lb (111.585 kg)  BMI 37.41 kg/m2  SpO2 96%  LMP 07/22/2014 General appearance: alert, cooperative, appears stated age, fatigued and slowed mentation Head: Normocephalic, without obvious abnormality, atraumatic Eyes: negative findings: lids and lashes normal and conjunctivae and sclerae normal Extremities: see foot exam Pulses: 2+ and symmetric Skin: see foot exam. Multiple post-inflamm changes R foot from scratching at night. Callouses bottom of feet. Nails trimmed.      Assessment:   1. Personal history of diabetic foot ulcer  2. Poorly controlled diabetes mellitus - insulin detemir (LEVEMIR)  100 UNIT/ML injection; Inject 0.22 mLs (22 Units total) into the skin daily. Inject at lunch time.  Dispense: 10 mL; Refill: 3 Must cut out soda. Pt verbalizes understanding.  See problem list for complete A&P See pt instructions. Phone follow up next week for BS. A1C next appt.

## 2014-08-15 LAB — FERRITIN: FERRITIN: 530 ng/mL — AB (ref 10–291)

## 2014-08-20 ENCOUNTER — Telehealth: Payer: Self-pay | Admitting: Nurse Practitioner

## 2014-08-20 NOTE — Telephone Encounter (Signed)
pls call pt: Advise Last A1C was much better-8.6, I mis-spoke when she was in ofc-I told her it was 11.6. I was wrong. Tell her to keep up good work.  Ask what blood sugars have been since I increased meds: fasting & after eating? Ask if we can leave detailed messages on work vm. It is the only number I can get through to.

## 2014-08-21 ENCOUNTER — Encounter: Payer: Self-pay | Admitting: Nurse Practitioner

## 2014-08-21 NOTE — Telephone Encounter (Signed)
Called pt all numbers are unavailable.

## 2014-08-28 ENCOUNTER — Ambulatory Visit: Payer: Managed Care, Other (non HMO) | Admitting: Obstetrics

## 2014-08-28 ENCOUNTER — Encounter (HOSPITAL_COMMUNITY)
Admission: RE | Admit: 2014-08-28 | Discharge: 2014-08-28 | Disposition: A | Discharge status: Home / Self Care | Payer: Managed Care, Other (non HMO) | Source: Ambulatory Visit | Attending: Nephrology | Admitting: Nephrology

## 2014-08-28 VITALS — BP 143/90 | HR 105 | Temp 98.8°F | Resp 20

## 2014-08-28 DIAGNOSIS — N182 Chronic kidney disease, stage 2 (mild): Secondary | ICD-10-CM | POA: Insufficient documentation

## 2014-08-28 LAB — POCT HEMOGLOBIN-HEMACUE: Hemoglobin: 11.3 g/dL — ABNORMAL LOW (ref 12.0–15.0)

## 2014-08-28 MED ORDER — EPOETIN ALFA 10000 UNIT/ML IJ SOLN
INTRAMUSCULAR | Status: AC
  Administered 2014-08-28: 10000 [IU] via SUBCUTANEOUS
  Filled 2014-08-28: qty 1

## 2014-08-28 MED ORDER — EPOETIN ALFA 20000 UNIT/ML IJ SOLN
INTRAMUSCULAR | Status: AC
  Administered 2014-08-28: 20000 [IU] via SUBCUTANEOUS
  Filled 2014-08-28: qty 1

## 2014-08-28 MED ORDER — EPOETIN ALFA 40000 UNIT/ML IJ SOLN: 30000.0000 [IU] | INTRAMUSCULAR | Status: DC

## 2014-09-05 NOTE — Telephone Encounter (Signed)
pls call & ask what sugars are running & how much insulin she is taking.

## 2014-09-06 NOTE — Telephone Encounter (Signed)
Sent unable to reach letter.

## 2014-09-11 ENCOUNTER — Encounter (HOSPITAL_COMMUNITY): Payer: Managed Care, Other (non HMO)

## 2014-09-11 ENCOUNTER — Ambulatory Visit (INDEPENDENT_AMBULATORY_CARE_PROVIDER_SITE_OTHER): Payer: Managed Care, Other (non HMO) | Admitting: Nurse Practitioner

## 2014-09-11 ENCOUNTER — Encounter: Payer: Self-pay | Admitting: Nurse Practitioner

## 2014-09-11 ENCOUNTER — Telehealth: Payer: Self-pay | Admitting: Nurse Practitioner

## 2014-09-11 VITALS — BP 165/94 | HR 94 | Temp 98.0°F | Resp 18 | Ht 68.0 in | Wt 253.0 lb

## 2014-09-11 DIAGNOSIS — R519 Headache, unspecified: Secondary | ICD-10-CM | POA: Insufficient documentation

## 2014-09-11 DIAGNOSIS — E1140 Type 2 diabetes mellitus with diabetic neuropathy, unspecified: Secondary | ICD-10-CM

## 2014-09-11 DIAGNOSIS — E1149 Type 2 diabetes mellitus with other diabetic neurological complication: Secondary | ICD-10-CM

## 2014-09-11 DIAGNOSIS — N184 Chronic kidney disease, stage 4 (severe): Secondary | ICD-10-CM

## 2014-09-11 DIAGNOSIS — Z23 Encounter for immunization: Secondary | ICD-10-CM

## 2014-09-11 DIAGNOSIS — IMO0002 Reserved for concepts with insufficient information to code with codable children: Secondary | ICD-10-CM

## 2014-09-11 DIAGNOSIS — Z6838 Body mass index (BMI) 38.0-38.9, adult: Secondary | ICD-10-CM

## 2014-09-11 DIAGNOSIS — E559 Vitamin D deficiency, unspecified: Secondary | ICD-10-CM

## 2014-09-11 DIAGNOSIS — G609 Hereditary and idiopathic neuropathy, unspecified: Secondary | ICD-10-CM

## 2014-09-11 DIAGNOSIS — Z9181 History of falling: Secondary | ICD-10-CM

## 2014-09-11 DIAGNOSIS — E1165 Type 2 diabetes mellitus with hyperglycemia: Secondary | ICD-10-CM

## 2014-09-11 DIAGNOSIS — G5793 Unspecified mononeuropathy of bilateral lower limbs: Secondary | ICD-10-CM

## 2014-09-11 DIAGNOSIS — E118 Type 2 diabetes mellitus with unspecified complications: Secondary | ICD-10-CM

## 2014-09-11 DIAGNOSIS — E1142 Type 2 diabetes mellitus with diabetic polyneuropathy: Secondary | ICD-10-CM

## 2014-09-11 DIAGNOSIS — IMO0001 Reserved for inherently not codable concepts without codable children: Secondary | ICD-10-CM

## 2014-09-11 DIAGNOSIS — R51 Headache: Secondary | ICD-10-CM

## 2014-09-11 LAB — GLUCOSE, POCT (MANUAL RESULT ENTRY): POC Glucose: 433 mg/dL — AB (ref 70–99)

## 2014-09-11 MED ORDER — INSULIN DETEMIR 100 UNIT/ML ~~LOC~~ SOLN: 27.0000 [IU] | Freq: Every day | SUBCUTANEOUS | Status: DC

## 2014-09-11 MED ORDER — INSULIN ASPART 100 UNIT/ML ~~LOC~~ SOLN: 8.0000 [IU] | Freq: Three times a day (TID) | SUBCUTANEOUS | Status: DC

## 2014-09-11 MED ORDER — HYDROCODONE-ACETAMINOPHEN 5-325 MG PO TABS: 1.0000 | ORAL_TABLET | Freq: Two times a day (BID) | ORAL | Status: DC

## 2014-09-11 MED ORDER — PREGABALIN 75 MG PO CAPS: 75.0000 mg | ORAL_CAPSULE | Freq: Every day | ORAL | Status: DC

## 2014-09-11 NOTE — Progress Notes (Signed)
Pre visit review using our clinic review tool, if applicable. No additional management support is needed unless otherwise documented below in the visit note. 

## 2014-09-11 NOTE — Assessment & Plan Note (Addendum)
Waking w/daily HA for 3 weeks. Not relieved by 1000 mg tylenol or 400 mg ibuprophen. Decreased appetiti, no nausea, no visual change, interrupts sleep. Nml exam. Labs today DD: fluid overload, toxin overload, hyperglycemia Adjust lyrica for CKD 4 Diet changes to decrease salt & protein Increase insulin.  Stop all Nsaids Hydrocodone. F.u 5 days.

## 2014-09-11 NOTE — Assessment & Plan Note (Signed)
No sensation w/monofilament in feet or up to 5" above ankles. No patellar reflex. Pt taking 300 mg lyrica qhs. Must make dose adjustment due to CKD (GFR 22, creat 2.8) -decrease to 75mg  qhs. Instructions given for decreasing dose gradually. Falling Ref pt for gait assessment & fall prevention.

## 2014-09-11 NOTE — Assessment & Plan Note (Signed)
No sensation in feet. Unable to elicit patellar reflex. Pt reports stumbling. Ref pt for gait assessment & fall prevention to include use of ambulating aids.

## 2014-09-11 NOTE — Patient Instructions (Addendum)
We have to give kidneys a break: No NSAIDS-no ibuprophen, advil, aleve, motrin.  Cut back on lyrica-Take 1 T at bedtime for 3 nights, then start 75 mg tablet.  I will mail grocery list.  For 6 weeks, make these changes to decrease your risk for chronic disease:   Breakfast: fruit, berries, melons and/or 1 cup whole grain cereal   Lunch: a plate of green food to include kale, spinach, leaf or Boston lettuce, cabbage, cucumbers, carrots, tomatoes, onions, peppers, avacado, radishes, broccoli, cauliflower, olives. More fruit.  Dinner: steamed vegetables to include peppers, broccoli, greens, spinach, bok choy, zucchini, squash, onions, garlic. More fruit.   You may have 1 cup beans daily; 1 cup cereal or whole grain (has to have 4 grams fiber/serving) or 1 cup potatoes; and a handful of nuts/seeds daily.  You could have 1/2 cup cereal in morning and 1/2 cup brown rice or potatoes at dinner/lunch.   No sweet drinks or foods.  Weak moments are OK. Just get back to it the next meal.  Increase levemir to 27 units once daily and increase novolog to 8 units with each meal (3 times daily).  Today: Drink water today all day. Keep sipping. Fill up glass when empty. Take 8 u novolog when you get in car. Then resume schedule as stated above.   We have to get your sugars under 250. Please continue to check sugar today. If it does not get under 300 & stay, call me.   I need you to come back Monday.

## 2014-09-11 NOTE — Telephone Encounter (Signed)
See phone note.  Ha better w/hydrocodone, but not relieved. Will call this evening.

## 2014-09-11 NOTE — Progress Notes (Signed)
Subjective:     Heather [name] is a 43 y.o. female who presents with c/o HA top of head for 3 weeks unrelieved by 400 mg ibuprophen nor 1000 mg tylenol. [name] accompanied by husband today. She denies nausea, fever, nasal congestion or vision changes. She c/o decreased appetite. She was in ofc about 4 weeks ago. Insulin was increased. We agreed to stay in touch by phone w/BS results, but we could not get in touch w/her-she has new phone number. She reports FBS of 253-430 on current meds-levemir 22u qd & novolog 5u tid. She does not adhere to diet recommendations. [name] sleepy in office today.  She has LE edema on exam & states she has not taken lasix due to it causing severe leg cramps. Her CBG in ofc today is 438.  She reports frequent stumbling-she is tearful as she talks about this. She has no sensation in feet up to 5 inches above ankles on monofilament. I am not able to elicit patellar reflex today.  The following portions of the patient's history were reviewed and updated as appropriate: allergies, current medications, past medical history, past social history, past surgical history and problem list.  Review of Systems Pertinent items are noted in HPI.    Objective:    BP 165/94  Pulse 94  Temp(Src) 98 F (36.7 C) (Oral)  Resp 18  Ht 5\' 8"  (1.727 m)  Wt 253 lb (114.76 kg)  BMI 38.48 kg/m2  SpO2 97%  LMP 09/11/2014 BP 165/94  Pulse 94  Temp(Src) 98 F (36.7 C) (Oral)  Resp 18  Ht 5\' 8"  (1.727 m)  Wt 253 lb (114.76 kg)  BMI 38.48 kg/m2  SpO2 97%  LMP 09/11/2014 General appearance: cooperative, appears stated age, moderately obese and sleepy-trouble staying awake. Head: Normocephalic, without obvious abnormality, atraumatic Eyes: wearing colored contacts-unable to assess pupillary reflex Lungs: clear to auscultation bilaterally Heart: regular rate and rhythm, S1, S2 normal, no murmur, click, rub or gallop Extremities: edema +2 pitting to knees Pulses: 2+ and  symmetric Neurologic: Mental status: alertness: lethargic, orientation: time, date, person, place, city, president, affect: normal and reasonable tearful when discusses stumbling. Motor: 4/5 UE & LE Reflexes: no patellar reflex    Assessment:  1. Unspecified vitamin D deficiency - Vit D  25 hydroxy (rtn osteoporosis monitoring)  2. Chronic kidney disease, stage 4 (severe) - CBC - Amylase - Hemoglobin A1c - Comprehensive metabolic panel - TSH - Lipase - Lipid panel - Vit D  25 hydroxy (rtn osteoporosis monitoring)  3. Type II or unspecified type diabetes mellitus with unspecified complication, uncontrolled - CBC - Amylase - Hemoglobin A1c - Comprehensive metabolic panel - TSH - Lipase - Lipid panel - POCT glucose (manual entry) - Flu Vaccine QUAD 36+ mos PF IM (Fluarix Quad PF) - Pneumococcal polysaccharide vaccine 23-valent greater than or equal to 2yo subcutaneous/IM - insulin detemir (LEVEMIR) 100 UNIT/ML injection; Inject 0.27 mLs (27 Units total) into the skin daily. Inject at lunch time.  Dispense: 10 mL; Refill: 3 - insulin aspart (NOVOLOG) 100 UNIT/ML injection; Inject 8 Units into the skin 3 (three) times daily with meals.  Dispense: 10 mL; Refill: 11  4. BMI 38.0-38.9,adult - TSH - Lipid panel  5. Neuropathic pain of both legs - pregabalin (LYRICA) 75 MG capsule; Take 1 capsule (75 mg total) by mouth daily.  Dispense: 30 capsule; Refill: 3  6. At high risk for falls - Ambulatory referral to Physical Therapy  7. Headache(784.0) - HYDROcodone-acetaminophen (  NORCO/VICODIN) 5-325 MG per tablet; Take 1 tablet by mouth every 12 (twelve) hours.  Dispense: 20 tablet; Refill: 0  8. Diabetic neuropathy, painful  See problem list for complete A&P See pt instructions. Phone f/u this afternoon. F/u in ofc 5 days

## 2014-09-11 NOTE — Assessment & Plan Note (Signed)
CBG 438 in ofc today. Pt reports FBS 254-430. Current meds: levemir 22 u qd, novolog 5u tid Increase levemir to 27 u qd, novolog to 8u tid. Increase water intake throughout day. Take 8u novolog as soon as gets to insulin, then proceed with dose increase as described. Pt will call this afternoon w/CBG reading. Specific diet plan given, husband present-he will participate as well.

## 2014-09-12 ENCOUNTER — Telehealth: Payer: Self-pay | Admitting: Nurse Practitioner

## 2014-09-12 ENCOUNTER — Other Ambulatory Visit: Payer: Self-pay | Admitting: Nurse Practitioner

## 2014-09-12 ENCOUNTER — Other Ambulatory Visit: Payer: Self-pay

## 2014-09-12 DIAGNOSIS — J0100 Acute maxillary sinusitis, unspecified: Secondary | ICD-10-CM

## 2014-09-12 DIAGNOSIS — E559 Vitamin D deficiency, unspecified: Secondary | ICD-10-CM

## 2014-09-12 DIAGNOSIS — I10 Essential (primary) hypertension: Secondary | ICD-10-CM

## 2014-09-12 LAB — COMPREHENSIVE METABOLIC PANEL
ALT: 18 U/L (ref 0–35)
AST: 15 U/L (ref 0–37)
Albumin: 3.3 g/dL — ABNORMAL LOW (ref 3.5–5.2)
Alkaline Phosphatase: 91 U/L (ref 39–117)
BUN: 40 mg/dL — ABNORMAL HIGH (ref 6–23)
CALCIUM: 8.6 mg/dL (ref 8.4–10.5)
CHLORIDE: 102 meq/L (ref 96–112)
CO2: 24 meq/L (ref 19–32)
CREATININE: 2.9 mg/dL — AB (ref 0.4–1.2)
GFR: 22.5 mL/min — AB (ref >60.00)
GLUCOSE: 439 mg/dL — AB (ref 70–99)
Potassium: 4.8 mEq/L (ref 3.5–5.1)
Sodium: 134 mEq/L — ABNORMAL LOW (ref 135–145)
Total Bilirubin: 0.4 mg/dL (ref 0.2–1.2)
Total Protein: 7.7 g/dL (ref 6.0–8.3)

## 2014-09-12 LAB — LIPID PANEL
Cholesterol: 281 mg/dL — ABNORMAL HIGH (ref 0–200)
HDL: 29.6 mg/dL — ABNORMAL LOW (ref >39.00)
NONHDL: 251.4
Total CHOL/HDL Ratio: 9
Triglycerides: 445 mg/dL — ABNORMAL HIGH (ref 0.0–149.0)
VLDL: 89 mg/dL — ABNORMAL HIGH (ref 0.0–40.0)

## 2014-09-12 LAB — CBC
HCT: 36.5 % (ref 36.0–46.0)
Hemoglobin: 11.5 g/dL — ABNORMAL LOW (ref 12.0–15.0)
MCHC: 31.6 g/dL (ref 30.0–36.0)
MCV: 90.5 fl (ref 78.0–100.0)
Platelets: 237 10*3/uL (ref 150.0–400.0)
RBC: 4.03 Mil/uL (ref 3.87–5.11)
RDW: 16.6 % — ABNORMAL HIGH (ref 11.5–15.5)
WBC: 5.2 10*3/uL (ref 4.0–10.5)

## 2014-09-12 LAB — AMYLASE: Amylase: 62 U/L (ref 27–131)

## 2014-09-12 LAB — LIPASE: Lipase: 51 U/L (ref 11.0–59.0)

## 2014-09-12 LAB — TSH: TSH: 1.15 u[IU]/mL (ref 0.35–4.50)

## 2014-09-12 LAB — VITAMIN D 25 HYDROXY (VIT D DEFICIENCY, FRACTURES): VITD: 6.99 ng/mL — ABNORMAL LOW (ref 30.00–100.00)

## 2014-09-12 LAB — LDL CHOLESTEROL, DIRECT: Direct LDL: 171.4 mg/dL

## 2014-09-12 LAB — HEMOGLOBIN A1C: HEMOGLOBIN A1C: 11.9 % — AB (ref 4.6–6.5)

## 2014-09-12 MED ORDER — VITAMIN D3 50 MCG (2000 UT) PO CAPS: 2000.0000 [IU] | ORAL_CAPSULE | Freq: Every day | ORAL | Status: DC

## 2014-09-12 MED ORDER — VITAMIN D3 50 MCG (2000 UT) PO CAPS
2000.0000 [IU] | ORAL_CAPSULE | Freq: Every day | ORAL | Status: DC
Start: 2014-09-12 — End: 2014-09-12

## 2014-09-12 NOTE — Telephone Encounter (Signed)
Called to check on pt. See phone note.

## 2014-09-12 NOTE — Telephone Encounter (Signed)
Spoke w/pt about labs:  High triglycerides, worse A1C. BUN reflective of dehydration (high sugars). Vit D extremely low, PTH monitored by renal. Recommend 2000 iu daily D3.  Will continue to keep in close contact regarding blood sugars & insulin. HA improved today-mild. Back at work today.

## 2014-09-16 ENCOUNTER — Ambulatory Visit: Payer: Managed Care, Other (non HMO) | Admitting: Nurse Practitioner

## 2014-09-16 MED ORDER — AMLODIPINE BESYLATE 10 MG PO TABS: 10.0000 mg | ORAL_TABLET | Freq: Every day | ORAL | Status: DC

## 2014-09-16 MED ORDER — AMOXICILLIN-POT CLAVULANATE 500-125 MG PO TABS: 1.0000 | ORAL_TABLET | Freq: Two times a day (BID) | ORAL | Status: DC

## 2014-09-16 NOTE — Telephone Encounter (Signed)
Bp this am is improved: 124/83. She reports no LE edema (unable to make dent over shin). Feels well-more alert & awake. Decrease amlodipine to 10 mg daily. Continue lasix at 80 mg BID for now.  STill has dull HA-not as bad as last week, also runny nose & pressure over sinuses. Will start augmentin at decreased dose due to cr cl of 30.  BS sugars are much improved with diet changes: FBS over last 6 days: 271-217. Preprandial sugars: 285-109. Taking 8u levemir daily & 3 u Novolog tid. Will continue to call with sugars, & blood pressures this week. Funds are low & cannot afford another co-pay this week. Will stay in touch by phone.

## 2014-09-17 ENCOUNTER — Telehealth: Payer: Self-pay | Admitting: Nurse Practitioner

## 2014-09-17 ENCOUNTER — Encounter (HOSPITAL_COMMUNITY)
Admission: RE | Admit: 2014-09-17 | Discharge: 2014-09-17 | Disposition: A | Discharge status: Home / Self Care | Payer: Managed Care, Other (non HMO) | Source: Ambulatory Visit | Attending: Nephrology | Admitting: Nephrology

## 2014-09-17 ENCOUNTER — Other Ambulatory Visit: Payer: Self-pay | Admitting: Nurse Practitioner

## 2014-09-17 VITALS — BP 111/74 | HR 80 | Resp 18

## 2014-09-17 DIAGNOSIS — IMO0002 Reserved for concepts with insufficient information to code with codable children: Secondary | ICD-10-CM

## 2014-09-17 DIAGNOSIS — E1165 Type 2 diabetes mellitus with hyperglycemia: Secondary | ICD-10-CM

## 2014-09-17 DIAGNOSIS — E1139 Type 2 diabetes mellitus with other diabetic ophthalmic complication: Secondary | ICD-10-CM

## 2014-09-17 DIAGNOSIS — N182 Chronic kidney disease, stage 2 (mild): Secondary | ICD-10-CM

## 2014-09-17 DIAGNOSIS — E118 Type 2 diabetes mellitus with unspecified complications: Principal | ICD-10-CM

## 2014-09-17 LAB — POCT HEMOGLOBIN-HEMACUE: HEMOGLOBIN: 9.7 g/dL — AB (ref 12.0–15.0)

## 2014-09-17 LAB — IRON AND TIBC
IRON: 95 ug/dL (ref 42–135)
Saturation Ratios: 41 % (ref 20–55)
TIBC: 232 ug/dL — ABNORMAL LOW (ref 250–470)
UIBC: 137 ug/dL (ref 125–400)

## 2014-09-17 LAB — FERRITIN: FERRITIN: 382 ng/mL — AB (ref 10–291)

## 2014-09-17 MED ORDER — GLUCOSE BLOOD VI STRP: ORAL_STRIP | Status: DC

## 2014-09-17 MED ORDER — EPOETIN ALFA 40000 UNIT/ML IJ SOLN: 30000.0000 [IU] | INTRAMUSCULAR | Status: DC

## 2014-09-17 MED ORDER — EPOETIN ALFA 10000 UNIT/ML IJ SOLN
INTRAMUSCULAR | Status: AC
  Administered 2014-09-17: 10000 [IU] via SUBCUTANEOUS
  Filled 2014-09-17: qty 1

## 2014-09-17 MED ORDER — EPOETIN ALFA 20000 UNIT/ML IJ SOLN
INTRAMUSCULAR | Status: AC
  Administered 2014-09-17: 20000 [IU] via SUBCUTANEOUS
  Filled 2014-09-17: qty 1

## 2014-09-17 NOTE — Telephone Encounter (Signed)
Spoke w/pt: Over last 5 days FBS: 271-185, prandial: 285-112.  10 U levemir qd, 3 u novolog tid.  Continue diet changes. Will send script for strips.

## 2014-09-19 ENCOUNTER — Other Ambulatory Visit: Payer: Self-pay | Admitting: Nurse Practitioner

## 2014-09-19 ENCOUNTER — Ambulatory Visit (INDEPENDENT_AMBULATORY_CARE_PROVIDER_SITE_OTHER): Payer: Managed Care, Other (non HMO) | Admitting: Nurse Practitioner

## 2014-09-19 ENCOUNTER — Encounter: Payer: Self-pay | Admitting: Nurse Practitioner

## 2014-09-19 VITALS — BP 119/77 | HR 90 | Temp 98.0°F | Resp 18 | Ht 68.0 in | Wt 251.0 lb

## 2014-09-19 DIAGNOSIS — E118 Type 2 diabetes mellitus with unspecified complications: Secondary | ICD-10-CM

## 2014-09-19 DIAGNOSIS — I10 Essential (primary) hypertension: Secondary | ICD-10-CM

## 2014-09-19 DIAGNOSIS — IMO0002 Reserved for concepts with insufficient information to code with codable children: Secondary | ICD-10-CM

## 2014-09-19 DIAGNOSIS — E1165 Type 2 diabetes mellitus with hyperglycemia: Secondary | ICD-10-CM

## 2014-09-19 DIAGNOSIS — IMO0001 Reserved for inherently not codable concepts without codable children: Secondary | ICD-10-CM

## 2014-09-19 DIAGNOSIS — R51 Headache: Secondary | ICD-10-CM

## 2014-09-19 LAB — BASIC METABOLIC PANEL
BUN: 34 mg/dL — ABNORMAL HIGH (ref 6–23)
CHLORIDE: 106 meq/L (ref 96–112)
CO2: 23 mEq/L (ref 19–32)
Calcium: 8.7 mg/dL (ref 8.4–10.5)
Creatinine, Ser: 3.1 mg/dL — ABNORMAL HIGH (ref 0.4–1.2)
GFR: 21.23 mL/min — ABNORMAL LOW (ref >60.00)
Glucose, Bld: 196 mg/dL — ABNORMAL HIGH (ref 70–99)
POTASSIUM: 4.5 meq/L (ref 3.5–5.1)
SODIUM: 136 meq/L (ref 135–145)

## 2014-09-19 LAB — CBC
HCT: 32.3 % — ABNORMAL LOW (ref 36.0–46.0)
Hemoglobin: 10.1 g/dL — ABNORMAL LOW (ref 12.0–15.0)
MCHC: 31.3 g/dL (ref 30.0–36.0)
MCV: 91.9 fl (ref 78.0–100.0)
Platelets: 332 10*3/uL (ref 150.0–400.0)
RBC: 3.51 Mil/uL — AB (ref 3.87–5.11)
RDW: 15.4 % (ref 11.5–15.5)
WBC: 7 10*3/uL (ref 4.0–10.5)

## 2014-09-19 MED ORDER — ELETRIPTAN HYDROBROMIDE 40 MG PO TABS: 40.0000 mg | ORAL_TABLET | Freq: Once | ORAL | Status: DC

## 2014-09-19 MED ORDER — GLUCOSE BLOOD VI STRP: ORAL_STRIP | Status: DC

## 2014-09-19 MED ORDER — METHYLPREDNISOLONE ACETATE 80 MG/ML IJ SUSP
80.0000 mg | Freq: Once | INTRAMUSCULAR | Status: AC
Start: 2014-09-19 — End: 2014-09-19
  Administered 2014-09-19: 80 mg via INTRAMUSCULAR

## 2014-09-19 NOTE — Progress Notes (Signed)
Spoke w/pt by phone.  HA has eased off, but came back this evening. Will send in relpax. Instructed on use. She got wrong test strips-needs one touch. Sent script.

## 2014-09-19 NOTE — Patient Instructions (Signed)
Stop amlodipine.  Continue lasix 1 pill twice daily.  Continue lisinopril.  Continue antibiotic.   Eat soup- vegetable or chicken noodle- concentrated-dilute with 1/2 can water to get salt up.   Sugar is going to do up with steroid injection. You will need more insulin over next 5 days. For next few days, eat only fresh fruit, fresh or cooked vegetables & soup, nuts, seeds. No cereal, no milk, no rice or beans.   Increase levemir to 18 units daily. Novolog 6u tid.  Text me with sugar at noon.  We will call with lab results.

## 2014-09-19 NOTE — Progress Notes (Signed)
Subjective:     Heather [name] is a 43 y.o. female who presents with unrelieved HA. She was in ofc 9 da with c/o HA for 3 weeks, w/worsened pain in last week. Her BS was 430, BP was elevated & she had pitting edema in LE up to knees. She has CKD stage 4. Her neuro exam was nml., reviewed head CT 4/15 during ER visit for HA, that was nml. She did not remember when last eye exam was, but was c/o blurred vision that comes & goes. She also c/o stumbling for many mos. She has severe neuropathy in feet & legs w/no sensation in feet nor patellar reflex. I gave her a prescription for hydrcodone for HA pain, performed labs, and discussed the sincerity of her uncontrolled DM, BP, weight, and impact on kidney disease & progressive neuropathy. I gave her specific diet plan with grocery list.  Labs: A1C worsened to 11.6 from 8.9. CMET abnml-reflective of hyperglycemia, Hgb slightly low, nml WBC, TSH nml. Had epogen treatment 2da-had 2 pnt HGB drop in 1 week.  Since last OV: HA persistent, dull, behind eyes. Started augmentin 3 days ago for possible sinusitis; dilated eye exam yesterday-no cause for HA found; hydrocodone dulls pain, does not relieve.  Much better control with BS: FBS ave 246, prandial ave: 157 BP much better, decreased amlodipine to 10 mg qd; lasix 80 mg bid Lost 2 lbs., intentional HA worse today. Pain located behind eyes. Photosensitive. Loose stool few days ago, some nausea today-likely due to ABX.  The following portions of the patient's history were reviewed and updated as appropriate: allergies, current medications, past medical history, past social history, past surgical history and problem list.  Review of Systems Pertinent items are noted in HPI.    Objective:    BP 119/77  Pulse 90  Temp(Src) 98 F (36.7 C) (Oral)  Resp 18  Ht 5\' 8"  (1.727 m)  Wt 251 lb (113.853 kg)  BMI 38.17 kg/m2  SpO2 100%  LMP 09/11/2014 BP 119/77  Pulse 90  Temp(Src) 98 F (36.7 C) (Oral)  Resp  18  Ht 5\' 8"  (1.727 m)  Wt 251 lb (113.853 kg)  BMI 38.17 kg/m2  SpO2 100%  LMP 09/11/2014 General appearance: cooperative, appears stated age, fatigued and frequently closes eyes due to pain. Head: Normocephalic, without obvious abnormality, atraumatic Eyes: negative findings: lids and lashes normal and conjunctivae and sclerae normal Ears: clear fluid RTM, bones visible, canal clear. L ear nml. Nose: Nares normal. Septum midline. Mucosa normal. No drainage or sinus tenderness. Throat: lips, mucosa, and tongue normal; teeth and gums normal Neck: no adenopathy, supple, symmetrical, trachea midline and thyroid not enlarged, symmetric, no tenderness/mass/nodules Lungs: clear to auscultation bilaterally Heart: regular rate and rhythm, S1, S2 normal, no murmur, click, rub or gallop and HR 90, regular Abdomen: soft, non-tender; bowel sounds normal; no masses,  no organomegaly and obese Extremities: edema +1 pitting halfway up leg Pulses: 2+ and symmetric Lymph nodes: Cervical, supraclavicular, and axillary nodes normal.    Assessment:     1. Headache(784.0) DD: sinusitis, hyponatremia, OTC rebound HA, TA/vasculitis - CBC - Basic metabolic panel -sed rate - POCT glucose (manual entry)-238 - methylPREDNISolone acetate (DEPO-MEDROL) injection 80 mg; Inject 1 mL (80 mg total) into the muscle once. Discussed sugars will be higher for few days. Gave adjusted insulin regimen. Continue antibiotic.  2. 2 pt drop hgb No black or bloody stools  Pt to call ofc if no relief in HA.

## 2014-09-19 NOTE — Progress Notes (Signed)
Pre visit review using our clinic review tool, if applicable. No additional management support is needed unless otherwise documented below in the visit note. 

## 2014-09-19 NOTE — Assessment & Plan Note (Addendum)
Stop norvasc. Continue lasix 80 bid & lisinopril Great control in last 9 days. Lost 2 pounds. LE edema improved, still +1 pitting halfway up shin

## 2014-09-19 NOTE — Assessment & Plan Note (Addendum)
Pt is texting me with sugars tid until consistent diet & control achieved. Much better control in last 9 days. She has been taking 8-12 u levemir qd & 3-4u novolog tid. FBS ave 246, prandial 157. Doing well with new recommended diet changes. Recent A1C 11 from 8.9

## 2014-09-20 ENCOUNTER — Other Ambulatory Visit: Payer: Self-pay

## 2014-09-20 ENCOUNTER — Other Ambulatory Visit: Payer: Self-pay | Admitting: Nurse Practitioner

## 2014-09-20 DIAGNOSIS — IMO0002 Reserved for concepts with insufficient information to code with codable children: Secondary | ICD-10-CM

## 2014-09-20 DIAGNOSIS — E1165 Type 2 diabetes mellitus with hyperglycemia: Secondary | ICD-10-CM

## 2014-09-20 DIAGNOSIS — E118 Type 2 diabetes mellitus with unspecified complications: Principal | ICD-10-CM

## 2014-09-20 MED ORDER — GLUCOSE BLOOD VI STRP: ORAL_STRIP | Status: AC

## 2014-09-20 MED ORDER — GLUCOSE BLOOD VI STRP: ORAL_STRIP | Status: DC

## 2014-09-20 NOTE — Progress Notes (Signed)
Spoke w/rep at ins for drug coverage. Meds would be more affordable if she gets 90 day supply at [facility].  Ins co will approve more strips. Sent verio strips to [facility] 90 supply/300 strips.  Spoke w/pt.

## 2014-09-23 ENCOUNTER — Encounter: Payer: Self-pay | Admitting: Nurse Practitioner

## 2014-09-23 ENCOUNTER — Other Ambulatory Visit: Payer: Self-pay | Admitting: Nurse Practitioner

## 2014-09-23 ENCOUNTER — Other Ambulatory Visit: Payer: Self-pay

## 2014-09-23 DIAGNOSIS — R51 Headache: Secondary | ICD-10-CM

## 2014-09-23 DIAGNOSIS — G5793 Unspecified mononeuropathy of bilateral lower limbs: Secondary | ICD-10-CM

## 2014-09-23 MED ORDER — ELETRIPTAN HYDROBROMIDE 40 MG PO TABS
40.0000 mg | ORAL_TABLET | Freq: Once | ORAL | Status: DC
Start: 2014-09-23 — End: 2015-07-17

## 2014-09-23 MED ORDER — PREGABALIN 75 MG PO CAPS: 75.0000 mg | ORAL_CAPSULE | Freq: Every day | ORAL | Status: DC

## 2014-09-23 NOTE — Progress Notes (Unsigned)
Spoke w/pt 9/26. Headache completely resolved after 5d augmentin & 2 doses relpax. Will reorder relpax & lyrica for 90 day supply.

## 2014-09-24 ENCOUNTER — Encounter: Payer: Self-pay | Admitting: Nurse Practitioner

## 2014-09-25 ENCOUNTER — Telehealth: Payer: Self-pay | Admitting: Nurse Practitioner

## 2014-09-25 NOTE — Telephone Encounter (Signed)
FBS running in 300's last 2 days, insulin adjusted-possibly due to depo-med inj last week.  She will check early am sugar for 2 nights to help determine is Dawn Phenomenon or Somogyi effect (rebound hyperglycemia). 2am sugar today was 114, then 147 at 8 am. Will have her check 1 more day.  Pt requests biometric form filled for employer-she will fax.

## 2014-10-01 ENCOUNTER — Encounter (HOSPITAL_COMMUNITY)
Admission: RE | Admit: 2014-10-01 | Discharge: 2014-10-01 | Disposition: A | Discharge status: Home / Self Care | Payer: Managed Care, Other (non HMO) | Source: Ambulatory Visit | Attending: Nephrology | Admitting: Nephrology

## 2014-10-01 VITALS — BP 154/80 | HR 97 | Temp 98.2°F | Resp 20

## 2014-10-01 DIAGNOSIS — N182 Chronic kidney disease, stage 2 (mild): Secondary | ICD-10-CM | POA: Diagnosis present

## 2014-10-01 LAB — POCT HEMOGLOBIN-HEMACUE: HEMOGLOBIN: 9.6 g/dL — AB (ref 12.0–15.0)

## 2014-10-01 MED ORDER — EPOETIN ALFA 20000 UNIT/ML IJ SOLN
INTRAMUSCULAR | Status: AC
  Administered 2014-10-01: 20000 [IU] via SUBCUTANEOUS
  Filled 2014-10-01: qty 1

## 2014-10-01 MED ORDER — EPOETIN ALFA 10000 UNIT/ML IJ SOLN
INTRAMUSCULAR | Status: AC
  Administered 2014-10-01: 10000 [IU] via SUBCUTANEOUS
  Filled 2014-10-01: qty 1

## 2014-10-01 MED ORDER — EPOETIN ALFA 40000 UNIT/ML IJ SOLN: 30000.0000 [IU] | INTRAMUSCULAR | Status: DC

## 2014-10-02 ENCOUNTER — Ambulatory Visit: Payer: Managed Care, Other (non HMO) | Attending: Nurse Practitioner | Admitting: Physical Therapy

## 2014-10-02 DIAGNOSIS — R2689 Other abnormalities of gait and mobility: Secondary | ICD-10-CM | POA: Insufficient documentation

## 2014-10-02 DIAGNOSIS — M6281 Muscle weakness (generalized): Secondary | ICD-10-CM | POA: Diagnosis not present

## 2014-10-02 DIAGNOSIS — Z5189 Encounter for other specified aftercare: Secondary | ICD-10-CM | POA: Insufficient documentation

## 2014-10-02 DIAGNOSIS — I10 Essential (primary) hypertension: Secondary | ICD-10-CM | POA: Diagnosis not present

## 2014-10-02 DIAGNOSIS — E1140 Type 2 diabetes mellitus with diabetic neuropathy, unspecified: Secondary | ICD-10-CM | POA: Diagnosis not present

## 2014-10-02 DIAGNOSIS — Z9181 History of falling: Secondary | ICD-10-CM | POA: Insufficient documentation

## 2014-10-04 ENCOUNTER — Ambulatory Visit (INDEPENDENT_AMBULATORY_CARE_PROVIDER_SITE_OTHER): Payer: Managed Care, Other (non HMO) | Admitting: Nurse Practitioner

## 2014-10-04 ENCOUNTER — Encounter: Payer: Self-pay | Admitting: Nurse Practitioner

## 2014-10-04 VITALS — BP 169/104 | HR 113 | Temp 101.5°F | Ht 68.0 in

## 2014-10-04 DIAGNOSIS — R05 Cough: Secondary | ICD-10-CM

## 2014-10-04 DIAGNOSIS — IMO0002 Reserved for concepts with insufficient information to code with codable children: Secondary | ICD-10-CM

## 2014-10-04 DIAGNOSIS — I10 Essential (primary) hypertension: Secondary | ICD-10-CM

## 2014-10-04 DIAGNOSIS — R509 Fever, unspecified: Secondary | ICD-10-CM

## 2014-10-04 DIAGNOSIS — R059 Cough, unspecified: Secondary | ICD-10-CM

## 2014-10-04 DIAGNOSIS — E1165 Type 2 diabetes mellitus with hyperglycemia: Secondary | ICD-10-CM

## 2014-10-04 DIAGNOSIS — R053 Chronic cough: Secondary | ICD-10-CM | POA: Insufficient documentation

## 2014-10-04 MED ORDER — HYDROCODONE-HOMATROPINE 5-1.5 MG/5ML PO SYRP: 5.0000 mL | ORAL_SOLUTION | Freq: Every evening | ORAL | Status: DC | PRN

## 2014-10-04 MED ORDER — INSULIN SYRINGES (DISPOSABLE) U-100 1 ML MISC: Status: DC

## 2014-10-04 MED ORDER — OSELTAMIVIR PHOSPHATE 75 MG PO CAPS: 75.0000 mg | ORAL_CAPSULE | Freq: Every day | ORAL | Status: DC

## 2014-10-04 MED ORDER — OSELTAMIVIR PHOSPHATE 30 MG PO CAPS: 30.0000 mg | ORAL_CAPSULE | Freq: Every day | ORAL | Status: DC

## 2014-10-04 NOTE — Assessment & Plan Note (Signed)
Made diet changes, doing much better w/BS. FBS:147- 373, prandial are best 109- 286 2 am: 211 & 114. Continue current regimen.

## 2014-10-04 NOTE — Patient Instructions (Addendum)
I think you have the flu.  Start tamiflu today.  Take cough syrup at night so you can rest.  Sip fluids every hour. Chicken broth & chicken noodle soup  is good.  Rest. Do not go back to work until no fever for 24 hours.   Start 1/2 tablet Norvasc once daily.  Take Levemir 12 u daily. Take novolog 3 times daily: 3u if blood sugar is 125 -200; 4u if 200-250; 5u if over 250.  Go ahead & take 12 u levemir & 5 u novolog when you get home. Continue to check sugar 3 times daily : 1st thing in morning-before food, afternoon, bedtime.  Call if still have fever over 100.4 on Monday.  Go to ER this weekend if you develop chest pain when you take deep breath.  Feel better!

## 2014-10-04 NOTE — Progress Notes (Signed)
   Subjective:    Patient ID: Heather [name], female    DOB: Jul 25, 1971, 43 y.o.   MRN: [id number]  Fever  This is a new problem. The current episode started yesterday. The problem occurs constantly. The problem has been unchanged. Her temperature was unmeasured prior to arrival. Associated symptoms include chest pain (sternal pain when coughs, no CP w deep inspiration), congestion, coughing and a sore throat. Pertinent negatives include no abdominal pain, diarrhea, nausea, vomiting or wheezing. She has tried nothing for the symptoms.      Review of Systems  Constitutional: Positive for fever, chills, activity change and fatigue. Negative for appetite change.  HENT: Positive for congestion, rhinorrhea and sore throat.   Respiratory: Positive for cough. Negative for chest tightness, shortness of breath and wheezing.   Cardiovascular: Positive for chest pain (sternal pain when coughs, no CP w deep inspiration).  Gastrointestinal: Negative for nausea, vomiting, abdominal pain and diarrhea.       Objective:   Physical Exam  Vitals reviewed. Constitutional: [name] oriented to person, place, and time. She appears well-developed and well-nourished. No distress.  HENT:  Head: Normocephalic and atraumatic.  Right Ear: External ear normal.  Left Ear: External ear normal.  Nose: Nose normal.  Mouth/Throat: Oropharynx is clear and moist. No oropharyngeal exudate.  Eyes: Conjunctivae are normal. Right eye exhibits no discharge. Left eye exhibits no discharge.  Neck: Normal range of motion. Neck supple. No thyromegaly present.  Cardiovascular: Regular rhythm and normal heart sounds.   No murmur heard. tachycardia  Pulmonary/Chest: Effort normal and breath sounds normal. No respiratory distress. She has no wheezes. She has no rales.  Lymphadenopathy:    She has no cervical adenopathy.  Neurological: [name] alert and oriented to person, place, and time.  Skin: Skin is warm and dry.    Psychiatric: She has a normal mood and affect. Her behavior is normal. Thought content normal.          Assessment & Plan:  1. Fever and chills Likely flu. - CBC-unable to access vein after 2 attempts - oseltamivir (TAMIFLU) 30 MG capsule; Take 1 capsule (30 mg total) by mouth daily.  Dispense: 5 capsule; Refill: 0  2. Cough - HYDROcodone-homatropine (HYCODAN) 5-1.5 MG/5ML syrup; Take 5 mLs by mouth at bedtime as needed for cough.  Dispense: 120 mL; Refill: 0  3. Diabetes mellitus type II, uncontrolled Doing much better w/BS control-made diet changes - Insulin Syringes, Disposable, U-100 1 ML MISC; Needs 4 syringes daily.  Dispense: 500 each; Refill: 2 - POCT CBG monitoring; Standing-271 levemir 12 u daily novolog tid: 3u if blood sugar is 125 -200; 4u if 200-250; 5u if over 250.  See pt instructions.

## 2014-10-04 NOTE — Progress Notes (Signed)
Pre visit review using our clinic review tool, if applicable. No additional management support is needed unless otherwise documented below in the visit note. 

## 2014-10-08 ENCOUNTER — Other Ambulatory Visit: Payer: Self-pay | Admitting: Nurse Practitioner

## 2014-10-08 ENCOUNTER — Encounter: Payer: Self-pay | Admitting: *Deleted

## 2014-10-08 DIAGNOSIS — R05 Cough: Secondary | ICD-10-CM

## 2014-10-08 DIAGNOSIS — R059 Cough, unspecified: Secondary | ICD-10-CM

## 2014-10-08 MED ORDER — BENZONATATE 100 MG PO CAPS: ORAL_CAPSULE | ORAL | Status: DC

## 2014-10-09 NOTE — Progress Notes (Signed)
Done

## 2014-10-10 ENCOUNTER — Ambulatory Visit: Payer: Managed Care, Other (non HMO) | Admitting: Rehabilitation

## 2014-10-14 ENCOUNTER — Ambulatory Visit: Payer: Managed Care, Other (non HMO) | Admitting: Nurse Practitioner

## 2014-10-15 ENCOUNTER — Inpatient Hospital Stay (HOSPITAL_COMMUNITY): Admission: RE | Admit: 2014-10-15 | Payer: Managed Care, Other (non HMO) | Source: Ambulatory Visit

## 2014-10-15 ENCOUNTER — Ambulatory Visit: Payer: Managed Care, Other (non HMO) | Admitting: Physical Therapy

## 2014-10-18 ENCOUNTER — Other Ambulatory Visit (INDEPENDENT_AMBULATORY_CARE_PROVIDER_SITE_OTHER): Payer: Managed Care, Other (non HMO)

## 2014-10-18 ENCOUNTER — Ambulatory Visit (INDEPENDENT_AMBULATORY_CARE_PROVIDER_SITE_OTHER): Payer: Managed Care, Other (non HMO) | Admitting: Nurse Practitioner

## 2014-10-18 ENCOUNTER — Ambulatory Visit: Payer: Managed Care, Other (non HMO) | Admitting: Physical Therapy

## 2014-10-18 ENCOUNTER — Encounter: Payer: Self-pay | Admitting: *Deleted

## 2014-10-18 ENCOUNTER — Encounter: Payer: Self-pay | Admitting: Nurse Practitioner

## 2014-10-18 VITALS — BP 118/77 | HR 90 | Temp 97.8°F | Ht 68.0 in | Wt 248.0 lb

## 2014-10-18 DIAGNOSIS — R0789 Other chest pain: Secondary | ICD-10-CM

## 2014-10-18 DIAGNOSIS — R112 Nausea with vomiting, unspecified: Secondary | ICD-10-CM

## 2014-10-18 DIAGNOSIS — J039 Acute tonsillitis, unspecified: Secondary | ICD-10-CM

## 2014-10-18 DIAGNOSIS — J988 Other specified respiratory disorders: Secondary | ICD-10-CM

## 2014-10-18 LAB — CBC
HCT: 31 % — ABNORMAL LOW (ref 36.0–46.0)
HEMOGLOBIN: 9.6 g/dL — AB (ref 12.0–15.0)
MCH: 27.7 pg (ref 26.0–34.0)
MCHC: 31 g/dL (ref 30.0–36.0)
MCV: 89.6 fL (ref 78.0–100.0)
Platelets: 368 10*3/uL (ref 150–400)
RBC: 3.46 MIL/uL — ABNORMAL LOW (ref 3.87–5.11)
RDW: 14.4 % (ref 11.5–15.5)
WBC: 9.7 10*3/uL (ref 4.0–10.5)

## 2014-10-18 LAB — BASIC METABOLIC PANEL
BUN: 32 mg/dL — ABNORMAL HIGH (ref 6–23)
CALCIUM: 8.7 mg/dL (ref 8.4–10.5)
CO2: 20 meq/L (ref 19–32)
CREATININE: 2.48 mg/dL — AB (ref 0.50–1.10)
Chloride: 105 mEq/L (ref 96–112)
Glucose, Bld: 258 mg/dL — ABNORMAL HIGH (ref 70–99)
Potassium: 5 mEq/L (ref 3.5–5.3)
Sodium: 136 mEq/L (ref 135–145)

## 2014-10-18 MED ORDER — METHYLPREDNISOLONE ACETATE 40 MG/ML IJ SUSP
40.0000 mg | Freq: Once | INTRAMUSCULAR | Status: AC
  Administered 2014-10-18: 40 mg via INTRAMUSCULAR

## 2014-10-18 MED ORDER — ALBUTEROL SULFATE HFA 108 (90 BASE) MCG/ACT IN AERS: INHALATION_SPRAY | RESPIRATORY_TRACT | Status: DC

## 2014-10-18 MED ORDER — CEFDINIR 300 MG PO CAPS: 300.0000 mg | ORAL_CAPSULE | Freq: Every day | ORAL | Status: DC

## 2014-10-18 NOTE — Progress Notes (Signed)
Pre visit review using our clinic review tool, if applicable. No additional management support is needed unless otherwise documented below in the visit note. 

## 2014-10-18 NOTE — Patient Instructions (Signed)
Start antibiotic today. Eat yogurt daily at least 2 hours after antibiotic to help prevent antibiotic -associated diarrhea. Get "light" to avoid extra sugar.  Use inhaler every 8 hours for 3 days, then when cough won't stop or chest feels tight.  Check sugars as they may be higher for few days. Use novolog guide I gave you to determine how much to take depending on blood sugar.  Potassium may be too high, causing nausea, so please get labs done.  Eat real juice popsicles to stay hydrated.   Stop lyrica.  Decrease norvasc to 1/2 Tab at night.  Let me know if not feeling better.  See you in 2 months!

## 2014-10-22 ENCOUNTER — Telehealth: Payer: Self-pay | Admitting: Nurse Practitioner

## 2014-10-22 ENCOUNTER — Ambulatory Visit: Payer: Managed Care, Other (non HMO) | Admitting: Rehabilitation

## 2014-10-22 NOTE — Telephone Encounter (Signed)
Patient notified of results. Patient is still coughing and throat is still sore, no more vomiting. Patient wanted to know what else she can do about her sore throat. Patient stated that her throat feels swollen and its hard to swallow.

## 2014-10-22 NOTE — Telephone Encounter (Signed)
pls call pt:Ask  if sore throat & cough better   If vomiting got better Advise labs show more improvement in kidney function.

## 2014-10-22 NOTE — Telephone Encounter (Signed)
pls call pt: Advise Finish ABX. Keep doing listerene gargles daily. She can use benzocaine throat lozenges also.

## 2014-10-23 NOTE — Telephone Encounter (Signed)
Patient notified

## 2014-10-25 ENCOUNTER — Ambulatory Visit: Payer: Managed Care, Other (non HMO) | Admitting: Rehabilitation

## 2014-10-27 DIAGNOSIS — R0789 Other chest pain: Secondary | ICD-10-CM | POA: Insufficient documentation

## 2014-10-27 DIAGNOSIS — J039 Acute tonsillitis, unspecified: Secondary | ICD-10-CM | POA: Insufficient documentation

## 2014-10-27 NOTE — Progress Notes (Signed)
Subjective:     Heather [name] is a 43 y.o. female presents w/vomiting that started today at 5am. She has vomited 3 times this morning. Last BS was 219. Associated symptoms: mild diffuse abdominal pain, heavy sensation in chest since yesterday, L ear pain. She denies fever, diarrhea, constipation, HA. She presented 2 wks ago w/fever & chills-I treated her for flu. Today, she states she is still coughing, but less frequent & nonproductive. No fever Heather [name] has CKD stage 4, DM, HTN. She had recent OV w/Dr Deterding-note reviewed dated 10/14/14: he was pleased w/improvements in BUN, CREAT, & better control of DM, improved PTH w/oral D3. BP goal set at 130/80.  She appears extremely fatigued today-as if difficult to stay awake.  The following portions of the patient's history were reviewed and updated as appropriate: allergies, current medications, past medical history, past social history, past surgical history and problem list.  Review of Systems Pertinent items are noted in HPI.    Objective:    BP 118/77 mmHg  Pulse 90  Temp(Src) 97.8 F (36.6 C) (Temporal)  Ht 5\' 8"  (1.727 m)  Wt 248 lb (112.492 kg)  BMI 37.72 kg/m2  SpO2 95% BP 118/77 mmHg  Pulse 90  Temp(Src) 97.8 F (36.6 C) (Temporal)  Ht 5\' 8"  (1.727 m)  Wt 248 lb (112.492 kg)  BMI 37.72 kg/m2  SpO2 95% General appearance: alert, cooperative, appears stated age, fatigued and mild distress Head: Normocephalic, without obvious abnormality, atraumatic Eyes: negative findings: lids and lashes normal, conjunctivae and sclerae normal and wearing colored contact lense Ears: normal TM's and external ear canals both ears Nose: Nares normal. Septum midline. Mucosa normal. No drainage or sinus tenderness. Throat: abnormal findings: tonsils +3, mild erythema, cryptic, scant exudate Lungs: clear to auscultation bilaterally Heart: regular rate and rhythm, S1, S2 normal, no murmur, click, rub or gallop Abdomen: soft, non-tender; bowel  sounds normal; no masses,  no organomegaly Extremities: edema mild pre-tibial edema    Assessment:     1. Chest pressure X 1day - EKG 12-Lead-NSR, no changes compared to 4/15  2. Acute tonsillitis - cefdinir (OMNICEF) 300 MG capsule; Take 1 capsule (300 mg total) by mouth daily.  Dispense: 10 capsule; Refill: 0  3. Respiratory infection - cefdinir (OMNICEF) 300 MG capsule; Take 1 capsule (300 mg total) by mouth daily.  Dispense: 10 capsule; Refill: 0 - albuterol (PROVENTIL HFA;VENTOLIN HFA) 108 (90 BASE) MCG/ACT inhaler; Use 2 puffs three times daily for 4 days, then as needed for cough or chest tightness.  Dispense: 1 Inhaler; Refill: 2 - methylPREDNISolone acetate (DEPO-MEDROL) injection 40 mg; Inject 1 mL (40 mg total) into the muscle once.  4. Nausea and vomiting, vomiting of unspecified type Likely viral gastroenteritis - Basic metabolic panel; Future - CBC; Future  See pt instructions F/u PRN no improvement. Keep Dec appt. For chronic conditions.

## 2014-10-28 ENCOUNTER — Encounter: Payer: Self-pay | Admitting: Nurse Practitioner

## 2014-10-29 ENCOUNTER — Ambulatory Visit: Payer: Managed Care, Other (non HMO) | Admitting: Physical Therapy

## 2014-10-29 ENCOUNTER — Telehealth: Payer: Self-pay | Admitting: *Deleted

## 2014-10-29 NOTE — Telephone Encounter (Signed)
Patient called office to let you know that she went back on her Lyrica. Patient stated that she cannot function with out Lyrica. Patient stated that she is taking two 75 mg capsule daily. Patient said that one 75 mg capsule is not enough.

## 2014-10-31 ENCOUNTER — Encounter (HOSPITAL_COMMUNITY)
Admission: RE | Admit: 2014-10-31 | Discharge: 2014-10-31 | Disposition: A | Discharge status: Home / Self Care | Payer: Managed Care, Other (non HMO) | Source: Ambulatory Visit | Attending: Nephrology | Admitting: Nephrology

## 2014-10-31 ENCOUNTER — Ambulatory Visit: Payer: Managed Care, Other (non HMO) | Admitting: Nurse Practitioner

## 2014-10-31 ENCOUNTER — Ambulatory Visit: Payer: Managed Care, Other (non HMO) | Admitting: Rehabilitation

## 2014-10-31 ENCOUNTER — Telehealth: Payer: Self-pay | Admitting: Nurse Practitioner

## 2014-10-31 DIAGNOSIS — N182 Chronic kidney disease, stage 2 (mild): Secondary | ICD-10-CM | POA: Diagnosis present

## 2014-10-31 LAB — POCT HEMOGLOBIN-HEMACUE: Hemoglobin: 10.6 g/dL — ABNORMAL LOW (ref 12.0–15.0)

## 2014-10-31 LAB — IRON AND TIBC
IRON: 46 ug/dL (ref 42–135)
Saturation Ratios: 19 % — ABNORMAL LOW (ref 20–55)
TIBC: 241 ug/dL — ABNORMAL LOW (ref 250–470)
UIBC: 195 ug/dL (ref 125–400)

## 2014-10-31 LAB — FERRITIN: Ferritin: 203 ng/mL (ref 10–291)

## 2014-10-31 MED ORDER — EPOETIN ALFA 20000 UNIT/ML IJ SOLN
INTRAMUSCULAR | Status: AC
  Administered 2014-10-31: 20000 [IU] via SUBCUTANEOUS
  Filled 2014-10-31: qty 1

## 2014-10-31 MED ORDER — EPOETIN ALFA 10000 UNIT/ML IJ SOLN
INTRAMUSCULAR | Status: AC
  Administered 2014-10-31: 10000 [IU] via SUBCUTANEOUS
  Filled 2014-10-31: qty 1

## 2014-10-31 MED ORDER — EPOETIN ALFA 40000 UNIT/ML IJ SOLN: 30000.0000 [IU] | INTRAMUSCULAR | Status: DC

## 2014-10-31 NOTE — Telephone Encounter (Signed)
Pt called-sd she is depressed since brother's death. Can't seem to move forward. Not taking care of self-only taking novolog once daily, no levemir.  Will see in ofc this afternoon.

## 2014-11-01 ENCOUNTER — Telehealth: Payer: Self-pay | Admitting: Nurse Practitioner

## 2014-11-01 NOTE — Telephone Encounter (Signed)
called to ask about elevated BP & if pt want to schedule appt w/Julie Witt-[facility].

## 2014-11-04 ENCOUNTER — Ambulatory Visit: Payer: Managed Care, Other (non HMO) | Admitting: Physical Therapy

## 2014-11-04 ENCOUNTER — Telehealth: Payer: Self-pay | Admitting: Nurse Practitioner

## 2014-11-04 DIAGNOSIS — F4321 Adjustment disorder with depressed mood: Secondary | ICD-10-CM

## 2014-11-04 NOTE — Telephone Encounter (Signed)
Will refer to Heather [name] for grief counseling.

## 2014-11-05 NOTE — Telephone Encounter (Signed)
Called Behavioral health to get pt an appointment in to see Marya Amsler. She has an appt on November 17th at [facility] at the [facility] in [place]. [address]. I called pt and left a detailed message about appt.

## 2014-11-07 ENCOUNTER — Ambulatory Visit: Payer: Managed Care, Other (non HMO) | Admitting: Rehabilitation

## 2014-11-08 ENCOUNTER — Ambulatory Visit (INDEPENDENT_AMBULATORY_CARE_PROVIDER_SITE_OTHER): Payer: Managed Care, Other (non HMO) | Admitting: Nurse Practitioner

## 2014-11-08 ENCOUNTER — Encounter: Payer: Self-pay | Admitting: Nurse Practitioner

## 2014-11-08 VITALS — BP 166/101 | HR 90 | Temp 98.0°F | Ht 68.0 in | Wt 246.0 lb

## 2014-11-08 DIAGNOSIS — F4321 Adjustment disorder with depressed mood: Secondary | ICD-10-CM

## 2014-11-08 DIAGNOSIS — I10 Essential (primary) hypertension: Secondary | ICD-10-CM

## 2014-11-08 MED ORDER — FLUOXETINE HCL 20 MG PO TABS: 20.0000 mg | ORAL_TABLET | Freq: Every day | ORAL | Status: DC

## 2014-11-08 MED ORDER — LISINOPRIL 20 MG PO TABS: 20.0000 mg | ORAL_TABLET | Freq: Every day | ORAL | Status: DC

## 2014-11-08 NOTE — Progress Notes (Signed)
Pre visit review using our clinic review tool, if applicable. No additional management support is needed unless otherwise documented below in the visit note. 

## 2014-11-08 NOTE — Assessment & Plan Note (Signed)
Not controlled today-missed meds last 2 days. Take meds when get home today.

## 2014-11-08 NOTE — Progress Notes (Signed)
Subjective:   Heather [name] is an 43 y.o. female who presents for evaluation and treatment of depressive symptoms. Symptoms have been present for about 1 month since her brother's death. She reports she doesn't want to go to work, get oob, fix hair, or eat. [name] not taking care of herself-no BP meds in 2 days, taking insulin sporadically. She expresses guilt that she wishes she could be stronger for family. [name] tearful as she speaks, stating she is feeling "a deep sadness that she cannot push through".  We discussed the fragiilty of her medical conditions: DM, HTN, kidney disease & necessity for her to take care of self. I spent 20 minutes discussing stages of grief & exploring her support systems:family, God, scripture. [name] not suicidal.   Review of Systems Pertinent items are noted in HPI.   Objective:   Mental Status Examination: Posture and motor behavior: Appropriate Dress, grooming, personal hygiene: hair not fixed, casual clothes-sweats Facial expression: Appropriate Speech: Appropriate Mood: sad, tearful Coherency and relevance of thought: Appropriate Thought content: Appropriate Perceptions: Appropriate Orientation:Appropriate Attention and concentration: Appropriate Memory: : Appropriate Vocabulary: Appropriate Judgment: Appropriate and she expresses what she knows she needs to do, yet is not doing-taking meds      Assessment:Plan    1. Grief reaction Spent over 20 minutes in counseling Ref for CBT, grief counseling Appt 11/17 w/Julie Witt - FLUoxetine (PROZAC) 20 MG tablet; Take 1 tablet (20 mg total) by mouth at bedtime.  Dispense: 30 tablet; Refill: 1 Discussed potential SE. 2.HTN, uncontrolled Take meds when get home.

## 2014-11-08 NOTE — Patient Instructions (Signed)
Grief takes time. Start journaling thoughts,write letters to your brother. Be honest with yourself-it's OK to be sad. It's OK to let others know you are sad-they probably are too. Sharing grief is therapeutic.   Start antidepressant take at supper or bedtime. Side effects usually go away in 2 weeks. Let me know if not able to take med.   Take lisinopril & lasix when you get home, then norvasc at bedtime.  See Melinda Crutch next week.  See me in 6 weeks.  Happy Anniversary!

## 2014-11-11 ENCOUNTER — Ambulatory Visit: Payer: Managed Care, Other (non HMO) | Admitting: Physical Therapy

## 2014-11-12 ENCOUNTER — Ambulatory Visit (INDEPENDENT_AMBULATORY_CARE_PROVIDER_SITE_OTHER): Payer: Managed Care, Other (non HMO) | Admitting: Licensed Clinical Social Worker

## 2014-11-12 DIAGNOSIS — F322 Major depressive disorder, single episode, severe without psychotic features: Secondary | ICD-10-CM

## 2014-11-13 ENCOUNTER — Other Ambulatory Visit (HOSPITAL_COMMUNITY): Payer: Self-pay | Admitting: *Deleted

## 2014-11-14 ENCOUNTER — Encounter (HOSPITAL_COMMUNITY)
Admission: RE | Admit: 2014-11-14 | Discharge: 2014-11-14 | Disposition: A | Discharge status: Home / Self Care | Payer: Managed Care, Other (non HMO) | Source: Ambulatory Visit | Attending: Nephrology | Admitting: Nephrology

## 2014-11-14 ENCOUNTER — Ambulatory Visit: Payer: Managed Care, Other (non HMO) | Admitting: Rehabilitation

## 2014-11-14 DIAGNOSIS — N182 Chronic kidney disease, stage 2 (mild): Secondary | ICD-10-CM | POA: Diagnosis not present

## 2014-11-14 LAB — POCT HEMOGLOBIN-HEMACUE: Hemoglobin: 11.1 g/dL — ABNORMAL LOW (ref 12.0–15.0)

## 2014-11-14 MED ORDER — EPOETIN ALFA 40000 UNIT/ML IJ SOLN: 30000.0000 [IU] | INTRAMUSCULAR | Status: DC

## 2014-11-14 MED ORDER — FERUMOXYTOL INJECTION 510 MG/17 ML
510.0000 mg | Freq: Once | INTRAVENOUS | Status: AC
  Administered 2014-11-14: 510 mg via INTRAVENOUS
  Filled 2014-11-14: qty 17

## 2014-11-14 MED ORDER — EPOETIN ALFA 20000 UNIT/ML IJ SOLN
INTRAMUSCULAR | Status: AC
  Administered 2014-11-14: 20000 [IU] via SUBCUTANEOUS
  Filled 2014-11-14: qty 1

## 2014-11-14 MED ORDER — EPOETIN ALFA 10000 UNIT/ML IJ SOLN
INTRAMUSCULAR | Status: AC
  Administered 2014-11-14: 10000 [IU] via SUBCUTANEOUS
  Filled 2014-11-14: qty 1

## 2014-11-18 ENCOUNTER — Ambulatory Visit: Payer: Managed Care, Other (non HMO) | Admitting: Physical Therapy

## 2014-11-19 ENCOUNTER — Other Ambulatory Visit: Payer: Self-pay | Admitting: Nurse Practitioner

## 2014-11-19 DIAGNOSIS — L299 Pruritus, unspecified: Secondary | ICD-10-CM

## 2014-11-19 MED ORDER — CAPSAICIN 0.025 % EX CREA: TOPICAL_CREAM | Freq: Two times a day (BID) | CUTANEOUS | Status: DC

## 2014-11-19 NOTE — Telephone Encounter (Signed)
Spoke with pt, she is scratching a lot and her skin starts to break on her legs but it is not scaly. She states she  was going to Dr Chalmers Cater and she Rx'd a cream but she doesn't remember which cream.

## 2014-11-19 NOTE — Telephone Encounter (Signed)
Pt called wanting to see if [facility] can call in a cream for her eczema.

## 2014-11-19 NOTE — Telephone Encounter (Signed)
Please clarify "eczema". I am not aware she has this diagnosis. Ask if she is referring to general itchyness of feet & legs or does she have scaly patches that itch?

## 2014-11-20 ENCOUNTER — Other Ambulatory Visit: Payer: Self-pay

## 2014-11-20 ENCOUNTER — Ambulatory Visit
Admission: RE | Admit: 2014-11-20 | Discharge: 2014-11-20 | Disposition: A | Discharge status: Home / Self Care | Payer: Managed Care, Other (non HMO) | Source: Ambulatory Visit | Attending: Chiropractic Medicine | Admitting: Chiropractic Medicine

## 2014-11-20 ENCOUNTER — Other Ambulatory Visit: Payer: Self-pay | Admitting: Chiropractic Medicine

## 2014-11-20 ENCOUNTER — Telehealth: Payer: Self-pay | Admitting: Nurse Practitioner

## 2014-11-20 DIAGNOSIS — M5442 Lumbago with sciatica, left side: Secondary | ICD-10-CM

## 2014-11-20 NOTE — Telephone Encounter (Signed)
Pt went to pharmacy to pick up a RX that had been called in and was unable to pick up because they had question about strength. Please call [name]

## 2014-11-20 NOTE — Telephone Encounter (Signed)
Spoke with pt, advised her that I spoke with the pharmacist and fixed the strength on her prescription.

## 2014-11-28 ENCOUNTER — Encounter (HOSPITAL_COMMUNITY): Payer: Managed Care, Other (non HMO)

## 2014-12-02 ENCOUNTER — Other Ambulatory Visit: Payer: Self-pay

## 2014-12-02 ENCOUNTER — Telehealth: Payer: Self-pay | Admitting: Nurse Practitioner

## 2014-12-02 DIAGNOSIS — E118 Type 2 diabetes mellitus with unspecified complications: Secondary | ICD-10-CM

## 2014-12-02 MED ORDER — INSULIN ASPART 100 UNIT/ML ~~LOC~~ SOLN: 4.0000 [IU] | Freq: Three times a day (TID) | SUBCUTANEOUS | Status: DC

## 2014-12-02 NOTE — Telephone Encounter (Signed)
Spoke with pt, advised message from Layne. Pt understood. 

## 2014-12-02 NOTE — Telephone Encounter (Signed)
pls call pt or pharmacy: Advise I refilled novolog, but Dr Deterding's ofc should be contacted for lasix refill.

## 2014-12-02 NOTE — Telephone Encounter (Signed)
Pt would like a refill on Furosemide and Novalog. Please advise.

## 2014-12-04 ENCOUNTER — Encounter (HOSPITAL_COMMUNITY)
Admission: RE | Admit: 2014-12-04 | Discharge: 2014-12-04 | Disposition: A | Discharge status: Home / Self Care | Payer: Managed Care, Other (non HMO) | Source: Ambulatory Visit | Attending: Nephrology | Admitting: Nephrology

## 2014-12-04 DIAGNOSIS — N182 Chronic kidney disease, stage 2 (mild): Secondary | ICD-10-CM

## 2014-12-04 LAB — FERRITIN: FERRITIN: 442 ng/mL — AB (ref 10–291)

## 2014-12-04 LAB — IRON AND TIBC
Iron: 81 ug/dL (ref 42–135)
Saturation Ratios: 34 % (ref 20–55)
TIBC: 241 ug/dL — AB (ref 250–470)
UIBC: 160 ug/dL (ref 125–400)

## 2014-12-04 LAB — POCT HEMOGLOBIN-HEMACUE: Hemoglobin: 12.6 g/dL (ref 12.0–15.0)

## 2014-12-04 MED ORDER — EPOETIN ALFA 40000 UNIT/ML IJ SOLN: 30000.0000 [IU] | INTRAMUSCULAR | Status: DC

## 2014-12-09 ENCOUNTER — Ambulatory Visit: Payer: Managed Care, Other (non HMO) | Admitting: Nurse Practitioner

## 2014-12-18 ENCOUNTER — Encounter (HOSPITAL_COMMUNITY)
Admission: RE | Admit: 2014-12-18 | Discharge: 2014-12-18 | Disposition: A | Discharge status: Home / Self Care | Payer: Managed Care, Other (non HMO) | Source: Ambulatory Visit | Attending: Nephrology | Admitting: Nephrology

## 2014-12-18 DIAGNOSIS — N182 Chronic kidney disease, stage 2 (mild): Secondary | ICD-10-CM

## 2014-12-18 LAB — POCT HEMOGLOBIN-HEMACUE: Hemoglobin: 10.8 g/dL — ABNORMAL LOW (ref 12.0–15.0)

## 2014-12-18 MED ORDER — EPOETIN ALFA 40000 UNIT/ML IJ SOLN: 30000.0000 [IU] | INTRAMUSCULAR | Status: DC

## 2014-12-18 MED ORDER — EPOETIN ALFA 10000 UNIT/ML IJ SOLN
INTRAMUSCULAR | Status: AC
  Administered 2014-12-18: 10000 [IU] via SUBCUTANEOUS
  Filled 2014-12-18: qty 1

## 2014-12-18 MED ORDER — EPOETIN ALFA 20000 UNIT/ML IJ SOLN
INTRAMUSCULAR | Status: AC
  Administered 2014-12-18: 20000 [IU] via SUBCUTANEOUS
  Filled 2014-12-18: qty 1

## 2014-12-21 ENCOUNTER — Other Ambulatory Visit: Payer: Self-pay | Admitting: Nurse Practitioner

## 2014-12-21 DIAGNOSIS — IMO0002 Reserved for concepts with insufficient information to code with codable children: Secondary | ICD-10-CM

## 2014-12-21 DIAGNOSIS — E1165 Type 2 diabetes mellitus with hyperglycemia: Secondary | ICD-10-CM

## 2014-12-21 DIAGNOSIS — E118 Type 2 diabetes mellitus with unspecified complications: Principal | ICD-10-CM

## 2014-12-21 MED ORDER — INSULIN DETEMIR 100 UNIT/ML ~~LOC~~ SOLN: 12.0000 [IU] | Freq: Every day | SUBCUTANEOUS | Status: DC

## 2014-12-23 NOTE — Progress Notes (Signed)
Done

## 2015-01-03 ENCOUNTER — Ambulatory Visit: Payer: Managed Care, Other (non HMO) | Admitting: Nurse Practitioner

## 2015-01-06 ENCOUNTER — Encounter: Payer: Self-pay | Admitting: Nurse Practitioner

## 2015-01-07 ENCOUNTER — Encounter (HOSPITAL_COMMUNITY)
Admission: RE | Admit: 2015-01-07 | Discharge: 2015-01-07 | Disposition: A | Discharge status: Home / Self Care | Payer: Managed Care, Other (non HMO) | Source: Ambulatory Visit | Attending: Nephrology | Admitting: Nephrology

## 2015-01-07 DIAGNOSIS — N182 Chronic kidney disease, stage 2 (mild): Secondary | ICD-10-CM | POA: Insufficient documentation

## 2015-01-07 LAB — POCT HEMOGLOBIN-HEMACUE: HEMOGLOBIN: 10.6 g/dL — AB (ref 12.0–15.0)

## 2015-01-07 MED ORDER — EPOETIN ALFA 20000 UNIT/ML IJ SOLN
INTRAMUSCULAR | Status: AC
  Administered 2015-01-07: 20000 [IU] via SUBCUTANEOUS
  Filled 2015-01-07: qty 1

## 2015-01-07 MED ORDER — EPOETIN ALFA 10000 UNIT/ML IJ SOLN
INTRAMUSCULAR | Status: AC
  Administered 2015-01-07: 10000 [IU] via SUBCUTANEOUS
  Filled 2015-01-07: qty 1

## 2015-01-07 MED ORDER — EPOETIN ALFA 40000 UNIT/ML IJ SOLN: 30000.0000 [IU] | INTRAMUSCULAR | Status: DC

## 2015-01-08 ENCOUNTER — Ambulatory Visit (INDEPENDENT_AMBULATORY_CARE_PROVIDER_SITE_OTHER): Payer: Managed Care, Other (non HMO) | Admitting: Nurse Practitioner

## 2015-01-08 ENCOUNTER — Encounter: Payer: Self-pay | Admitting: Nurse Practitioner

## 2015-01-08 VITALS — BP 118/80 | HR 90 | Temp 98.0°F | Ht 68.0 in | Wt 241.0 lb

## 2015-01-08 DIAGNOSIS — E0590 Thyrotoxicosis, unspecified without thyrotoxic crisis or storm: Secondary | ICD-10-CM

## 2015-01-08 DIAGNOSIS — E875 Hyperkalemia: Secondary | ICD-10-CM

## 2015-01-08 DIAGNOSIS — M792 Neuralgia and neuritis, unspecified: Secondary | ICD-10-CM

## 2015-01-08 DIAGNOSIS — E1165 Type 2 diabetes mellitus with hyperglycemia: Secondary | ICD-10-CM

## 2015-01-08 DIAGNOSIS — IMO0001 Reserved for inherently not codable concepts without codable children: Secondary | ICD-10-CM

## 2015-01-08 DIAGNOSIS — E0580 Other thyrotoxicosis without thyrotoxic crisis or storm: Secondary | ICD-10-CM

## 2015-01-08 DIAGNOSIS — G629 Polyneuropathy, unspecified: Secondary | ICD-10-CM

## 2015-01-08 DIAGNOSIS — I10 Essential (primary) hypertension: Secondary | ICD-10-CM

## 2015-01-08 DIAGNOSIS — R8290 Unspecified abnormal findings in urine: Secondary | ICD-10-CM

## 2015-01-08 DIAGNOSIS — M791 Myalgia: Secondary | ICD-10-CM

## 2015-01-08 DIAGNOSIS — E538 Deficiency of other specified B group vitamins: Secondary | ICD-10-CM

## 2015-01-08 DIAGNOSIS — M609 Myositis, unspecified: Secondary | ICD-10-CM

## 2015-01-08 DIAGNOSIS — E559 Vitamin D deficiency, unspecified: Secondary | ICD-10-CM

## 2015-01-08 LAB — POCT URINALYSIS DIPSTICK
Bilirubin, UA: NEGATIVE
Ketones, UA: NEGATIVE
Nitrite, UA: NEGATIVE
PH UA: 5.5
Protein, UA: >=300
SPEC GRAV UA: 1.02
Urobilinogen, UA: 0.2

## 2015-01-08 LAB — IRON AND TIBC
Iron: 79 ug/dL (ref 42–145)
SATURATION RATIOS: 30 % (ref 20–55)
TIBC: 264 ug/dL (ref 250–470)
UIBC: 185 ug/dL (ref 125–400)

## 2015-01-08 LAB — HEMOGLOBIN A1C
HEMOGLOBIN A1C: 10.7 % — AB (ref <5.7)
Mean Plasma Glucose: 260 mg/dL — ABNORMAL HIGH (ref <117)

## 2015-01-08 LAB — FERRITIN: FERRITIN: 325 ng/mL — AB (ref 10–291)

## 2015-01-08 MED ORDER — LYRICA 75 MG PO CAPS: 75.0000 mg | ORAL_CAPSULE | Freq: Every day | ORAL | Status: DC

## 2015-01-08 MED ORDER — PREGABALIN 75 MG PO CAPS: 75.0000 mg | ORAL_CAPSULE | Freq: Every day | ORAL | Status: DC

## 2015-01-08 MED ORDER — SULFAMETHOXAZOLE-TRIMETHOPRIM 800-160 MG PO TABS: 1.0000 | ORAL_TABLET | Freq: Every day | ORAL | Status: DC

## 2015-01-08 NOTE — Progress Notes (Signed)
Pre visit review using our clinic review tool, if applicable. No additional management support is needed unless otherwise documented below in the visit note. 

## 2015-01-08 NOTE — Patient Instructions (Signed)
PLEASE take insulin as directed.   Cut back oranges to 3/day. Replace with sunflower seeds or cashews.  Keep eating salads-ice berg lettuce is OK. Add some romaine or fresh spinach for best nutrition.  Use daily sinus rinse (Neilmed sinus rinse) for post-nasal drip & sore throat.  GREAT job w/weight loss!!! Keep it up!  [facility] Vanita Ingles will call you.  Text me your blood sugars at least once daily.  See you in 3 months!

## 2015-01-08 NOTE — Progress Notes (Signed)
Subjective:     Heather [name] is an 44 y.o. female who presents for follow up of diabetes. Current symptoms include: hyperglycemia and foul-smelling urine, paresthesia LE, frequent falls. Patient denies foot ulcerations, nausea and vomiting. Evaluation to date has included: 11.6 from 8.6 hemoglobin A1C and microalbuminuria. Home sugars: 300's. Current treatments: 12 u Levemir, 4 U novolog tid. Last dilated eye exam 2015. Pt admits she is not c/w taking insulin daily-states "she just gets tired of so many shots". She has made diet changes, but has gotten off track. [name] eating a lot of oranges-her K+ was elevated at kidney appt last week, more green food, smaller portions. Wt indicates she is down 5 lbs.  Not exercising. Depression: Did not fill script for prozac. Went to therapist regarding grief counseling x1. She says she is doing better-can look at brother's picture & talk about him . Kidney stage 4: reviewed [facility] note dated 01/01/15. Rec wt loss, continue oral vit D, epogen, no changes to BP regimen. f/u 3 mos. Sore throat: gets occasional sore throat & swollen glands, lasts about 1 day, some runny nose. Myalgia: takes lyrica when needed. She has run out. Pain comes & goes. Rates 5/10, sometimes interferes w/sleep. Neuropathy:no recent falls, did not continue w/PT. [name] interested in water therapy. [facility]   The following portions of the patient's history were reviewed and updated as appropriate: allergies, current medications, past medical history, past social history, past surgical history and problem list.  Review of Systems Constitutional: negative for chills and fevers Eyes: negative for visual disturbance Respiratory: negative for cough Cardiovascular: positive for lower extremity edema, palpitations and last a few seconds, no associated symptoms, takes extra lasix when legs are swollen, negative for chest pain Genitourinary:positive for  malodorous urine, negative for dysuria, frequency, hematuria and hesitancy Behavioral/Psych: negative for anxiety and depression Endocrine: negative for diabetic symptoms including poor wound healing    Objective:    BP 118/80 mmHg  Pulse 90  Temp(Src) 98 F (36.7 C) (Temporal)  Ht 5\' 8"  (1.727 m)  Wt 241 lb (109.317 kg)  BMI 36.65 kg/m2  SpO2 99% General appearance: alert, cooperative, appears stated age and no distress Head: Normocephalic, without obvious abnormality, atraumatic Eyes: negative findings: lids and lashes normal and conjunctivae and sclerae normal, wearing glasses & contact lenses Throat: lips, mucosa, and tongue normal; teeth and gums normal Neck: no adenopathy, no carotid bruit, supple, symmetrical, trachea midline and thyroid not enlarged, symmetric, no tenderness/mass/nodules Lungs: clear to auscultation bilaterally Heart: regular rate and rhythm, S1, S2 normal, no murmur, click, rub or gallop Extremities: extremities normal, atraumatic, no cyanosis or edema Pulses: 2+ and symmetric  Laboratory: Sept 2015 A1c 11.6,  Last GFR 22, creat 2.92 K 5.3  Assessment:Plan  1. Vitamin D deficiency - Vit D  25 hydroxy (rtn osteoporosis monitoring)  2. B12 deficiency - Vitamin B12  3. Foul smelling urine - Urine culture - POCT urinalysis dipstick - sulfamethoxazole-trimethoprim (BACTRIM DS,SEPTRA DS) 800-160 MG per tablet; Take 1 tablet by mouth daily.  Dispense: 3 tablet; Refill: 0  4. Hyperkalemia - Basic metabolic panel  5. Poorly controlled diabetes mellitus - Hemoglobin A1c  6. Peripheral neuropathic pain - pregabalin (LYRICA) 75 MG capsule; Take 1 capsule (75 mg total) by mouth daily.  Dispense: 30 capsule; Refill: 3  7. Essential hypertension  8. Secondary hyperthyroidism  9. Myalgia and myositis  See problem list for complete A&P See pt instructions. F/u  3 mos  Spent 25 minutes with patient. Over 50% of OV spent in counseling-diabetes  education

## 2015-01-09 LAB — BASIC METABOLIC PANEL
BUN: 55 mg/dL — ABNORMAL HIGH (ref 6–23)
CALCIUM: 9.1 mg/dL (ref 8.4–10.5)
CO2: 27 meq/L (ref 19–32)
CREATININE: 3.14 mg/dL — AB (ref 0.40–1.20)
Chloride: 99 mEq/L (ref 96–112)
GFR: 20.58 mL/min — AB (ref >60.00)
GLUCOSE: 280 mg/dL — AB (ref 70–99)
POTASSIUM: 5 meq/L (ref 3.5–5.1)
SODIUM: 131 meq/L — AB (ref 135–145)

## 2015-01-09 LAB — VITAMIN D 25 HYDROXY (VIT D DEFICIENCY, FRACTURES): VITD: 5.63 ng/mL — ABNORMAL LOW (ref 30.00–100.00)

## 2015-01-09 LAB — VITAMIN B12: VITAMIN B 12: 462 pg/mL (ref 211–911)

## 2015-01-09 NOTE — Assessment & Plan Note (Addendum)
Heather [name] admits to feeling complacent about taking meds & skips insulin doses-both levemir & novolog Explored reasons for complacency: she doesn't think she is depressed. Seems she gets into cycle of high sugar-not thinking clearly & not taking meds. She says she knows she needs it. Diet: says she has gotten off track, but not eating as bad as she used to: eating oranges when she wants something sweet-possibly why K+ was up. Picky about vegetables, but has learned to like more green foods than in past. She has cut back portions. Home BS are running in 300-400 She says her novolog was held at pharm & could not get refill She did better when she was texting BS sugars to me-needs accountability. Start texting 1 bs reading daily to me.

## 2015-01-09 NOTE — Assessment & Plan Note (Signed)
At goal today Recent 5 lb weight loss

## 2015-01-09 NOTE — Assessment & Plan Note (Addendum)
Shukrona stopped taking Vitamin D. Advised get back on it-2000 iu qd Explained how it impacts parathyroid gland-calcium absorption-leg cramps. She was unaware of relationship. Check level today

## 2015-01-09 NOTE — Assessment & Plan Note (Signed)
Does not use daily Helps with pain. Continue PRN

## 2015-01-10 ENCOUNTER — Other Ambulatory Visit: Payer: Self-pay | Admitting: Nurse Practitioner

## 2015-01-10 DIAGNOSIS — G629 Polyneuropathy, unspecified: Secondary | ICD-10-CM

## 2015-01-10 MED ORDER — GABAPENTIN 300 MG PO CAPS: 300.0000 mg | ORAL_CAPSULE | Freq: Two times a day (BID) | ORAL | Status: DC | PRN

## 2015-01-11 LAB — URINE CULTURE: Colony Count: >=100000

## 2015-01-13 ENCOUNTER — Telehealth: Payer: Self-pay | Admitting: Nurse Practitioner

## 2015-01-13 NOTE — Telephone Encounter (Signed)
pls call pt: Ask if she got gabapentin, novolog, and antibiotic-she has a UTI. Encourage good sugar & BP control as kidneys do not look as good as they did 2 mos ago. Needs to take Vitamin D every day with meal-5000 iu D3

## 2015-01-13 NOTE — Telephone Encounter (Signed)
Spoke with pt, advised lab results from [place]. Pt understood. She did not pick up ABX from her pharmacy but will pick up medication today.

## 2015-01-15 ENCOUNTER — Other Ambulatory Visit: Payer: Self-pay | Admitting: *Deleted

## 2015-01-15 DIAGNOSIS — R8290 Unspecified abnormal findings in urine: Secondary | ICD-10-CM

## 2015-01-15 MED ORDER — SULFAMETHOXAZOLE-TRIMETHOPRIM 800-160 MG PO TABS
1.0000 | ORAL_TABLET | Freq: Every day | ORAL | Status: DC
Start: 2015-01-15 — End: 2015-03-07

## 2015-01-23 ENCOUNTER — Other Ambulatory Visit (HOSPITAL_COMMUNITY): Payer: Self-pay | Admitting: *Deleted

## 2015-01-24 ENCOUNTER — Encounter (HOSPITAL_COMMUNITY)
Admission: RE | Admit: 2015-01-24 | Discharge: 2015-01-24 | Disposition: A | Discharge status: Home / Self Care | Payer: Managed Care, Other (non HMO) | Source: Ambulatory Visit | Attending: Nephrology | Admitting: Nephrology

## 2015-01-24 DIAGNOSIS — N182 Chronic kidney disease, stage 2 (mild): Secondary | ICD-10-CM | POA: Diagnosis not present

## 2015-01-24 LAB — POCT HEMOGLOBIN-HEMACUE: HEMOGLOBIN: 10.3 g/dL — AB (ref 12.0–15.0)

## 2015-01-24 MED ORDER — EPOETIN ALFA 20000 UNIT/ML IJ SOLN
INTRAMUSCULAR | Status: AC
  Administered 2015-01-24: 20000 [IU] via SUBCUTANEOUS
  Filled 2015-01-24: qty 1

## 2015-01-24 MED ORDER — EPOETIN ALFA 10000 UNIT/ML IJ SOLN
INTRAMUSCULAR | Status: AC
  Administered 2015-01-24: 10000 [IU] via SUBCUTANEOUS
  Filled 2015-01-24: qty 1

## 2015-01-24 MED ORDER — EPOETIN ALFA 40000 UNIT/ML IJ SOLN: 30000.0000 [IU] | INTRAMUSCULAR | Status: DC

## 2015-01-28 ENCOUNTER — Telehealth: Payer: Self-pay | Admitting: *Deleted

## 2015-01-28 DIAGNOSIS — E1142 Type 2 diabetes mellitus with diabetic polyneuropathy: Secondary | ICD-10-CM

## 2015-01-28 MED ORDER — PREGABALIN 75 MG PO CAPS: 75.0000 mg | ORAL_CAPSULE | Freq: Every day | ORAL | Status: DC

## 2015-01-28 NOTE — Telephone Encounter (Signed)
Spoke w/Dr Deterding about muscle cramps. He recommends 8 oz tonic water BID. If not helpful, start 0.5 mg to 1 mg clonazepam.  Spoke w/Jenayah. Advised to start tonic water. I will send in 10 tabs lyrica-her request. She will call if tonic water is not helpful.  FBS are running under 200 : 180-190.

## 2015-01-28 NOTE — Telephone Encounter (Signed)
Patient called office requesting Lyrica. Patient stated that she is having spasms and had them all night. Because of cost, she wants to know if she can have a few pills instead of a full prescription. Please advise?

## 2015-02-07 ENCOUNTER — Encounter (HOSPITAL_COMMUNITY)
Admission: RE | Admit: 2015-02-07 | Discharge: 2015-02-07 | Disposition: A | Discharge status: Home / Self Care | Payer: Managed Care, Other (non HMO) | Source: Ambulatory Visit | Attending: Nephrology | Admitting: Nephrology

## 2015-02-07 DIAGNOSIS — N182 Chronic kidney disease, stage 2 (mild): Secondary | ICD-10-CM | POA: Insufficient documentation

## 2015-02-07 LAB — POCT HEMOGLOBIN-HEMACUE: Hemoglobin: 10.5 g/dL — ABNORMAL LOW (ref 12.0–15.0)

## 2015-02-07 MED ORDER — EPOETIN ALFA 40000 UNIT/ML IJ SOLN: 30000.0000 [IU] | INTRAMUSCULAR | Status: DC

## 2015-02-08 LAB — IRON AND TIBC
Iron: 48 ug/dL (ref 42–145)
SATURATION RATIOS: 21 % (ref 20–55)
TIBC: 233 ug/dL — ABNORMAL LOW (ref 250–470)
UIBC: 185 ug/dL (ref 125–400)

## 2015-02-08 LAB — FERRITIN: FERRITIN: 154 ng/mL (ref 10–291)

## 2015-02-08 MED ORDER — EPOETIN ALFA 10000 UNIT/ML IJ SOLN
INTRAMUSCULAR | Status: AC
  Administered 2015-02-07: 10000 [IU] via SUBCUTANEOUS
  Filled 2015-02-08: qty 1

## 2015-02-08 MED ORDER — EPOETIN ALFA 20000 UNIT/ML IJ SOLN
INTRAMUSCULAR | Status: AC
  Administered 2015-02-07: 20000 [IU] via SUBCUTANEOUS
  Filled 2015-02-08: qty 1

## 2015-02-11 ENCOUNTER — Telehealth: Payer: Self-pay | Admitting: Nurse Practitioner

## 2015-02-11 ENCOUNTER — Other Ambulatory Visit: Payer: Self-pay | Admitting: Nurse Practitioner

## 2015-02-11 ENCOUNTER — Encounter: Payer: Self-pay | Admitting: *Deleted

## 2015-02-11 DIAGNOSIS — E1140 Type 2 diabetes mellitus with diabetic neuropathy, unspecified: Secondary | ICD-10-CM

## 2015-02-11 DIAGNOSIS — E1149 Type 2 diabetes mellitus with other diabetic neurological complication: Principal | ICD-10-CM

## 2015-02-11 NOTE — Progress Notes (Signed)
I put this in [facility] work que. They are live on EPIC.

## 2015-02-11 NOTE — Progress Notes (Unsigned)
Pt is c/o severe bilat foot spasm, occurring with increasing frequency.  She has had uncontrolled DM 2 for 20 + years and has stage 4 kidney disease & HTN. Historical treatment for foot spasm : lyrica (adjusted dosing due to CKD/creat clearance under 30) & flexeril with relief. In last few mos, spasms are becoming more intense & frequent & unrelieved by historical meds. She cannot afford lyrica at present. I substituted with gabapentin. Pt states it does not help. Last week she went to [facility] ER due to severe pain. Spasm was relieved by valium & percocet.  Monofilament testing reveals no sensation in feet & ankles. She has developed pronation of bilat feet. Etiology of spasm is uncertain: neuropathy and or electrolyte changes due to kidney disease. Will refer to neuro. Perhaps NCT will help determine etiology of spasm.

## 2015-02-11 NOTE — Telephone Encounter (Signed)
Please advise? Patient is still in office.

## 2015-02-11 NOTE — Telephone Encounter (Signed)
Patient needs a note releasing her to go back to work with no restrictions.

## 2015-02-19 ENCOUNTER — Other Ambulatory Visit (HOSPITAL_COMMUNITY): Payer: Self-pay | Admitting: *Deleted

## 2015-02-21 ENCOUNTER — Encounter (HOSPITAL_COMMUNITY)
Admission: RE | Admit: 2015-02-21 | Discharge: 2015-02-21 | Disposition: A | Discharge status: Home / Self Care | Payer: Managed Care, Other (non HMO) | Source: Ambulatory Visit | Attending: Nephrology | Admitting: Nephrology

## 2015-02-21 DIAGNOSIS — N182 Chronic kidney disease, stage 2 (mild): Secondary | ICD-10-CM | POA: Diagnosis not present

## 2015-02-21 LAB — POCT HEMOGLOBIN-HEMACUE: Hemoglobin: 11.2 g/dL — ABNORMAL LOW (ref 12.0–15.0)

## 2015-02-21 MED ORDER — EPOETIN ALFA 20000 UNIT/ML IJ SOLN
INTRAMUSCULAR | Status: AC
  Filled 2015-02-21: qty 1

## 2015-02-21 MED ORDER — EPOETIN ALFA 10000 UNIT/ML IJ SOLN
INTRAMUSCULAR | Status: AC
  Filled 2015-02-21: qty 1

## 2015-02-21 MED ORDER — SODIUM CHLORIDE 0.9 % IV SOLN: Freq: Once | INTRAVENOUS | Status: DC

## 2015-02-21 MED ORDER — SODIUM CHLORIDE 0.9 % IV SOLN
510.0000 mg | Freq: Once | INTRAVENOUS | Status: AC
  Administered 2015-02-21: 510 mg via INTRAVENOUS
  Filled 2015-02-21: qty 17

## 2015-02-21 MED ORDER — EPOETIN ALFA 40000 UNIT/ML IJ SOLN: 30000.0000 [IU] | INTRAMUSCULAR | Status: DC

## 2015-03-07 ENCOUNTER — Ambulatory Visit (INDEPENDENT_AMBULATORY_CARE_PROVIDER_SITE_OTHER): Payer: Managed Care, Other (non HMO) | Admitting: Nurse Practitioner

## 2015-03-07 VITALS — BP 140/80 | HR 91 | Temp 98.4°F | Ht 69.0 in | Wt 243.0 lb

## 2015-03-07 DIAGNOSIS — I10 Essential (primary) hypertension: Secondary | ICD-10-CM

## 2015-03-07 DIAGNOSIS — E559 Vitamin D deficiency, unspecified: Secondary | ICD-10-CM

## 2015-03-07 DIAGNOSIS — K3184 Gastroparesis: Secondary | ICD-10-CM

## 2015-03-07 DIAGNOSIS — M609 Myositis, unspecified: Secondary | ICD-10-CM

## 2015-03-07 DIAGNOSIS — M791 Myalgia: Secondary | ICD-10-CM

## 2015-03-07 DIAGNOSIS — IMO0001 Reserved for inherently not codable concepts without codable children: Secondary | ICD-10-CM

## 2015-03-07 DIAGNOSIS — E1140 Type 2 diabetes mellitus with diabetic neuropathy, unspecified: Secondary | ICD-10-CM

## 2015-03-07 DIAGNOSIS — E539 Vitamin B deficiency, unspecified: Secondary | ICD-10-CM

## 2015-03-07 DIAGNOSIS — E118 Type 2 diabetes mellitus with unspecified complications: Secondary | ICD-10-CM

## 2015-03-07 MED ORDER — INSULIN DETEMIR 100 UNIT/ML ~~LOC~~ SOLN: 15.0000 [IU] | Freq: Every day | SUBCUTANEOUS | Status: DC

## 2015-03-07 MED ORDER — B COMPLEX PO TABS: 1.0000 | ORAL_TABLET | Freq: Every day | ORAL | Status: DC

## 2015-03-07 MED ORDER — CHOLECALCIFEROL 125 MCG (5000 UT) PO CAPS: 5000.0000 [IU] | ORAL_CAPSULE | Freq: Every day | ORAL | Status: DC

## 2015-03-07 MED ORDER — INSULIN ASPART 100 UNIT/ML ~~LOC~~ SOLN: 5.0000 [IU] | Freq: Three times a day (TID) | SUBCUTANEOUS | Status: DC

## 2015-03-07 NOTE — Patient Instructions (Signed)
I am so glad you are exercising & ENJOYING it!!!!! Keep it up!  SUGAR IS POISON. Do not eat it.  Increase levemir to 15u daily & Novolog to 5 units three times daily.  Keep sugar log. Record some fasting, after eating, & at bedtime. Bring in 1 month.  If your sugars are running under 200 after eating let me know.  Stop Lyrica.  Use gabapentin &/or tylenol if needed for pain.  Start taking 2 tabs vitamin D (4000) until gone, then start 5000 iu capsule.  Start B complex.  I will see you in 1 month!

## 2015-03-07 NOTE — Progress Notes (Signed)
Pre visit review using our clinic review tool, if applicable. No additional management support is needed unless otherwise documented below in the visit note. 

## 2015-03-13 ENCOUNTER — Encounter: Payer: Self-pay | Admitting: Nurse Practitioner

## 2015-03-13 ENCOUNTER — Encounter (HOSPITAL_COMMUNITY)
Admission: RE | Admit: 2015-03-13 | Discharge: 2015-03-13 | Disposition: A | Discharge status: Home / Self Care | Payer: Managed Care, Other (non HMO) | Source: Ambulatory Visit | Attending: Nephrology | Admitting: Nephrology

## 2015-03-13 DIAGNOSIS — N182 Chronic kidney disease, stage 2 (mild): Secondary | ICD-10-CM

## 2015-03-13 DIAGNOSIS — E559 Vitamin D deficiency, unspecified: Secondary | ICD-10-CM | POA: Insufficient documentation

## 2015-03-13 DIAGNOSIS — E539 Vitamin B deficiency, unspecified: Secondary | ICD-10-CM | POA: Insufficient documentation

## 2015-03-13 LAB — POCT HEMOGLOBIN-HEMACUE: HEMOGLOBIN: 11.5 g/dL — AB (ref 12.0–15.0)

## 2015-03-13 MED ORDER — EPOETIN ALFA 40000 UNIT/ML IJ SOLN: 30000.0000 [IU] | INTRAMUSCULAR | Status: DC

## 2015-03-13 NOTE — Assessment & Plan Note (Signed)
Not at goal today No peripheral edema Continue to f/u w/Dr Deterding

## 2015-03-13 NOTE — Assessment & Plan Note (Signed)
Continue "physician referral exercise program".

## 2015-03-13 NOTE — Assessment & Plan Note (Addendum)
Increased Levemir to 15u qd & novolog to 5u tid Discussed BS goal: under 250 at all times Reviewed diet changes: made progress, but still eating candy. Keep nuts & fruit at desk if feeling shaky, NOT candy. [facility]. Needs to be on statin check lipids at next OV & discuss. Atorvastatin does not require dose adjustment for renal.

## 2015-03-13 NOTE — Progress Notes (Signed)
Subjective:     Heather [name] is a 44 y.o. female presents for f/u of DM w/complication, HTN, vit B 12 def, Vit d def, diabetic neuropathy, & myalgia. DM w/complication: current meds: 12 u levemir qd, 5u novolog tid. BS sugars running in 300's. Pt reports 1 episode of feeling shaky & leg pain when sugar in 200's. S[facility] & is enjoying. Feels better-more energy & alert. Reviewed diet : she is eating more salads, continues to eat fruit. Occasionally eating candy. Encouraged to consider refined sugar as "POISON" NEVER eat candy. Keep nuts & fresh fruit at desk.  HTN: Not at goal today. Current meds lisinopril 20 mg & lasix 80 mg qd. Managed by Dr Deterding. Vit B 12 def: level in 400's. Supplement may help with PN. Discussed starting B complex. Vit d def: related to kidney disease. Has hyperparathyroidism. PTH decreased when on D supplement in past. She stopped taking. Encouraged to take 5000 iu qd with meal.  diabetic neuropathy: duration-many yrs., severe-risk for falls, gabapentin helps pain. Still has cramps at night-tonic water not helpful. Pt doesn't like the way lyrica makes her feel-drowsy & doesn't want to take hydrocodone due to thinks she "could become addicted to it". Myalgia:Historical diagnosis of FM. Feels better now that [facility]- Working w/wellness coach & nurse (Physician referral exercise program).  The following portions of the patient's history were reviewed and updated as appropriate: allergies, current medications, past medical history, past social history, past surgical history and problem list.  Review of Systems Constitutional: negative for fatigue Cardiovascular: negative for chest pressure/discomfort and irregular heart beat Integument/breast: negative for skin lesion(s) and inspecting feet regularly Neurological: negative for headaches Behavioral/Psych: negative for depression, tobacco use and moving through stages of  grief as expected since brother's death Endocrine: positive for diabetic symptoms including blurry vision, negative for diabetic symptoms including polydipsia, polyphagia, polyuria and poor wound healing and temperature intolerance    Objective:    BP 140/80 mmHg  Pulse 91  Temp(Src) 98.4 F (36.9 C) (Temporal)  Ht 5\' 9"  (1.753 m)  Wt 243 lb (110.224 kg)  BMI 35.87 kg/m2  SpO2 97% BP 140/80 mmHg  Pulse 91  Temp(Src) 98.4 F (36.9 C) (Temporal)  Ht 5\' 9"  (1.753 m)  Wt 243 lb (110.224 kg)  BMI 35.87 kg/m2  SpO2 97% General appearance: alert, cooperative, appears stated age and no distress Head: Normocephalic, without obvious abnormality, atraumatic Eyes: negative findings: lids and lashes normal, conjunctivae and sclerae normal and wearing colored contact lenses Neck: no adenopathy, no carotid bruit, supple, symmetrical, trachea midline and thyroid not enlarged, symmetric, no tenderness/mass/nodules Lungs: clear to auscultation bilaterally Heart: regular rate and rhythm, S1, S2 normal, no murmur, click, rub or gallop Extremities: extremities normal, atraumatic, no cyanosis or edema Pulses: 2+ and symmetric Neurologic: Sensory: diminished sensation in feet & ankles Gait: feet pronate    Assessment:Plan     1. Vitamin D deficiency 5000 iu qd D3 - Cholecalciferol ([employer] VIT D 5000 HIGH-POTENCY) 5000 UNITS capsule; Take 1 capsule (5,000 Units total) by mouth daily.  Dispense: 30 capsule; Refill: 2  2. Vitamin B deficiency Start B complex supplement - b complex vitamins tablet; Take 1 tablet by mouth daily.  Dispense: 30 tablet; Refill: 1  3. Diabetic neuropathy, painful Continue gabapentin Tighter sugar control Inspect feet daily 4. Gastroparesis Constipation Improved w/ diet changes  5. Essential hypertension Not at goal today Continue current meds  Salt restriction F/u w/ Dr Deterding 6. Myalgia and myositis Use tylenol & gabapentin. D/c lyrica or hydrocodone-pt  doesn't like the way these meds make her feel.  7. Type 2 diabetes with complication, not controlled Increase meds Discuss statin at next OV - insulin detemir (LEVEMIR) 100 UNIT/ML injection; Inject 0.15 mLs (15 Units total) into the skin daily. Inject at lunch time.  Dispense: 10 mL; Refill: 1 - insulin aspart (NOVOLOG) 100 UNIT/ML injection; Inject 5 Units into the skin 3 (three) times daily with meals.  Dispense: 10 mL; Refill: 11  F/u 1 mo-labs A1C, lipids, CMET, TSH, vit D, HIV

## 2015-03-13 NOTE — Assessment & Plan Note (Signed)
Continue gabapentin PRN Continue to work on tighter BS control, although Zuriana has more pain when BS is in 200's. Less pain when in 300 & 400. Educated about consequences of high sugars. [facility] Continue to examine feet every day!

## 2015-03-14 ENCOUNTER — Ambulatory Visit (INDEPENDENT_AMBULATORY_CARE_PROVIDER_SITE_OTHER): Payer: Managed Care, Other (non HMO) | Admitting: Neurology

## 2015-03-14 ENCOUNTER — Encounter: Payer: Self-pay | Admitting: Neurology

## 2015-03-14 VITALS — BP 150/84 | HR 97 | Ht 69.0 in | Wt 229.1 lb

## 2015-03-14 DIAGNOSIS — N183 Chronic kidney disease, stage 3 unspecified: Secondary | ICD-10-CM

## 2015-03-14 DIAGNOSIS — Z79899 Other long term (current) drug therapy: Secondary | ICD-10-CM

## 2015-03-14 DIAGNOSIS — E1140 Type 2 diabetes mellitus with diabetic neuropathy, unspecified: Secondary | ICD-10-CM

## 2015-03-14 LAB — IRON AND TIBC
IRON: 162 ug/dL — AB (ref 42–145)
Saturation Ratios: 63 % — ABNORMAL HIGH (ref 20–55)
TIBC: 256 ug/dL (ref 250–470)
UIBC: 94 ug/dL — ABNORMAL LOW (ref 125–400)

## 2015-03-14 LAB — FERRITIN: Ferritin: 453 ng/mL — ABNORMAL HIGH (ref 10–291)

## 2015-03-14 MED ORDER — AMITRIPTYLINE HCL 10 MG PO TABS: ORAL_TABLET | ORAL | Status: DC

## 2015-03-14 MED ORDER — EPOETIN ALFA 20000 UNIT/ML IJ SOLN
INTRAMUSCULAR | Status: AC
  Administered 2015-03-13: 20000 [IU]
  Filled 2015-03-14: qty 1

## 2015-03-14 MED ORDER — EPOETIN ALFA 10000 UNIT/ML IJ SOLN
INTRAMUSCULAR | Status: AC
Start: 2015-03-14 — End: 2015-03-13
  Administered 2015-03-13: 10000 [IU]
  Filled 2015-03-14: qty 1

## 2015-03-14 NOTE — Patient Instructions (Signed)
1.  Start amitriptyline 10mg  at bedtime x 2 weeks, then increase to 2 tablets thereafter 2.  Continue gabapentin 300mg  twice daily 3.  Call or send me MyChart update in 6 weeks 4.  Return to clinic in 2-months

## 2015-03-14 NOTE — Progress Notes (Signed)
[name] Clinic Note - Initial Visit   Date: 03/14/2015   Heather [name] MRN: [id number] DOB: 12-21-1971   Dear Heather Pugh, NP:  Thank you for your kind referral of Heather [name] for consultation of burning pain of her hands and feet. Although her history is well known to you, please allow Heather [name] to reiterate it for the purpose of our medical record. The patient was accompanied to the clinic by self.   History of Present Illness: Heather [name] is a 44 y.o. right-handed African American female with insulin-dependent diabetes type 1 and fibromyalgia, hypertension, anemia, and CKD (Cr. 3.14) presenting for evaluation of burning pain of her hands and feet.    Since around 2010, she developed numbness and sharp stabbing pain of bilateral feet.  Over the years, it is progressed to involve her hands.  Pain is worse with activity and walking.  She had significant relief with Lyrica 150mg  which she took for many years, but it made her pain completely resolve.  She was concerned that she was getting "addicted to Lyrica" because her pain was significantly improved when taking it.  She would mostly take it twice daily, but would take it up to four times per day twice per week for severe pain.  However, based on her renal clearance, she should only be taking 150/day.  She was started on gabapentin 300mg  twice daily which eases the pain, which was started a few weeks ago.  She does not have any side effects.     Out-side paper records, electronic medical record, and images have been reviewed where available and summarized as:  Lab Results  Component Value Date   HGBA1C 10.7* 01/08/2015   Lab Results  Component Value Date   TSH 1.15 09/11/2014   Lab Results  Component Value Date   [id number] 01/08/2015     Past Medical History  Diagnosis Date  . Gastroparesis   . Anemia   . IBS (irritable bowel syndrome)   . Nausea   . Hx: UTI (urinary tract  infection)   . Fibroids     leiomyoma 8/04 per Heather [name]  . Fibromyalgia   . Diabetes mellitus   . Hypertension   . Chronic kidney disease   . Allergy   . Blood transfusion without reported diagnosis   . Infertility     eval at [facility]    Past Surgical History  Procedure Laterality Date  . Eye surgery      bilateral, vitrectomies  . Fibroid tumors removed    . Foot surgery      bilateral     Medications:  Current Outpatient Prescriptions on File Prior to Visit  Medication Sig Dispense Refill  . b complex vitamins tablet Take 1 tablet by mouth daily. 30 tablet 1  . calcitRIOL (ROCALTROL) 0.25 MCG capsule Take 0.5 mcg by mouth at bedtime.     . Cholecalciferol ([employer] VIT D 5000 HIGH-POTENCY) 5000 UNITS capsule Take 1 capsule (5,000 Units total) by mouth daily. 30 capsule 2  . cyclobenzaprine (FLEXERIL) 10 MG tablet Take 1 tablet (10 mg total) by mouth 3 (three) times daily as needed for muscle spasms. 30 tablet 0  . epoetin alfa (EPOGEN,PROCRIT) 60454 UNIT/ML injection 30,000 Units every 14 (fourteen) days.     . ferumoxytol (FERAHEME) 510 MG/17ML SOLN injection Inject 1,020 mg into the vein once.    . furosemide (LASIX) 80 MG tablet Take 160 mg by mouth 2 (two) times daily.    [name]  gabapentin (NEURONTIN) 300 MG capsule Take 1 capsule (300 mg total) by mouth 2 (two) times daily as needed. 30 capsule 1  . insulin aspart (NOVOLOG) 100 UNIT/ML injection Inject 5 Units into the skin 3 (three) times daily with meals. 10 mL 11  . insulin detemir (LEVEMIR) 100 UNIT/ML injection Inject 0.15 mLs (15 Units total) into the skin daily. Inject at lunch time. 10 mL 1  . Insulin Syringes, Disposable, U-100 1 ML MISC Needs 4 syringes daily. 500 each 2  . lisinopril (PRINIVIL,ZESTRIL) 20 MG tablet Take 1 tablet (20 mg total) by mouth daily. 90 tablet 1  . eletriptan (RELPAX) 40 MG tablet Take 1 tablet (40 mg total) by mouth once. Repeat once, in 2h, if still have headache. Do not take more than 2 tablets in 24h.  10 tablet 0   No current facility-administered medications on file prior to visit.    Allergies:  Allergies  Allergen Reactions  . Ibuprofen     CKD stage 3. Should avoid.  . Dilaudid [Hydromorphone Hcl] Itching    Family History: Family History  Problem Relation Age of Onset  . Colon polyps Mother   . Diabetes Mother   . Anesthesia problems Neg Hx   . Hypotension Neg Hx   . Malignant hyperthermia Neg Hx   . Pseudochol deficiency Neg Hx   . Heart murmur Father   . Hypertension Father   . Diabetes Sister   . Kidney failure Sister   . Diabetes Brother   . Retinal degeneration Brother   . Heart murmur Son   . Stroke Paternal Grandmother   . Diabetes Sister     Social History: History   Social History  . Marital Status: Divorced    Spouse Name: N/A  . Number of Children: 1  . Years of Education: college   Occupational History  .  [employer]   Social History Main Topics  . Smoking status: Never Smoker   . Smokeless tobacco: Never Used  . Alcohol Use: No  . Drug Use: No  . Sexual Activity:    Partners: Male    Birth Control/ Protection: None   Other Topics Concern  . Not on file   Social History Narrative   Patient lives with fiance. She has 1 grown son. Works full-time, she Paramedic for attorney.   Caffeine Use: 2 cups daily; sodas occasionally    Review of Systems:  CONSTITUTIONAL: No fevers, chills, night sweats, or weight loss.   EYES: No visual changes or eye pain ENT: No hearing changes.  No history of nose bleeds.   RESPIRATORY: No cough, wheezing and shortness of breath.   CARDIOVASCULAR: Negative for chest pain, and palpitations.   GI: Negative for abdominal discomfort, blood in stools or black stools.  No recent change in bowel habits.   GU:  No history of incontinence.   MUSCLOSKELETAL: +history of joint pain or swelling.  No myalgias.   SKIN: Negative for lesions, rash, and itching.   HEMATOLOGY/ONCOLOGY: Negative for  prolonged bleeding, bruising easily, and swollen nodes.  No history of cancer.   ENDOCRINE: Negative for cold or heat intolerance, polydipsia or goiter.   PSYCH:  No depression or anxiety symptoms.   NEURO: As Above.   Vital Signs:  BP 150/84 mmHg  Pulse 97  Ht 5\' 9"  (1.753 m)  Wt 229 lb 2 oz (103.93 kg)  BMI 33.82 kg/m2  SpO2 98%   General Medical Exam:   General:  Well  appearing, comfortable.   Eyes/ENT: see cranial nerve examination.   Neck: No masses appreciated.  Full range of motion without tenderness.  No carotid bruits. Respiratory:  Clear to auscultation, good air entry bilaterally.   Cardiac:  Regular rate and rhythm, no murmur.   Extremities:  No deformities, edema, or skin discoloration.  Skin:  No rashes or lesions.  Neurological Exam: MENTAL STATUS including orientation to time, place, person, recent and remote memory, attention span and concentration, language, and fund of knowledge is normal.  Speech is not dysarthric.  CRANIAL NERVES: II:  No visual field defects.  Unremarkable fundi.   III-IV-VI: Pupils equal round and reactive to light.  Normal conjugate, extra-ocular eye movements in all directions of gaze.  No nystagmus.  No ptosis.   V:  Normal facial sensation.     VII:  Normal facial symmetry and movements.  No pathologic facial reflexes.  VIII:  Normal hearing and vestibular function.   IX-X:  Normal palatal movement.   XI:  Normal shoulder shrug and head rotation.   XII:  Normal tongue strength and range of motion, no deviation or fasciculation.  MOTOR:  There is atrophy of the intrinsic feet muscles, no fasciculations or abnormal movements.  No pronator drift.  Tone is normal.    Right Upper Extremity:    Left Upper Extremity:    Deltoid  5/5   Deltoid  5/5   Biceps  5/5   Biceps  5/5   Triceps  5/5   Triceps  5/5   Wrist extensors  5/5   Wrist extensors  5/5   Wrist flexors  5/5   Wrist flexors  5/5   Finger extensors  5-/5   Finger extensors   5-/5   Finger flexors  5-/5   Finger flexors  5-/5   Dorsal interossei  4+/5   Dorsal interossei  4+/5   Abductor pollicis  5/5   Abductor pollicis  5/5   Tone (Ashworth scale)  0  Tone (Ashworth scale)  0   Right Lower Extremity:    Left Lower Extremity:    Hip flexors  5/5   Hip flexors  5/5   Hip extensors  5/5   Hip extensors  5/5   Knee flexors  5/5   Knee flexors  5/5   Knee extensors  5/5   Knee extensors  5/5   Dorsiflexors  5/5   Dorsiflexors  5/5   Plantarflexors  5/5   Plantarflexors  5/5   Toe extensors  4+/5   Toe extensors  4+/5   Toe flexors  4+/5   Toe flexors  4+/5   Tone (Ashworth scale)  0  Tone (Ashworth scale)  0   MSRs:  Right                                                                 Left brachioradialis 2+  brachioradialis 2+  biceps 2+  biceps 2+  triceps 2+  triceps 2+  patellar 0  Patellar 0  ankle jerk 0  ankle jerk 0  Hoffman no  Hoffman no  plantar response mute  plantar response mute   SENSORY:  Vibration, temperature, and pin prick absent distal to ankles.  Proprioception impaired at the great toe.  Pin prick is also reduced in the hands bilaterally.   Romberg's sign present.   COORDINATION/GAIT: Normal finger-to- nose-finger and heel-to-shin.  Intact rapid alternating movements bilaterally.  Able to rise from a chair without using arms.  Gait wide-based and slightly unsteady.  She cannot perform stressed or tandem gait.   IMPRESSION/PLAN: Painful diabetic neuropathy affecting the hands and feet Previously well controlled on Lyrica at 150mg  BID, but given CKD recommended dose is 150/d and patient did not have and relief at this dose. [name] now taking gabapentin 300mg  BID which is the daily maximum for renal adjustment, but still with breakthrough pain Recommend starting amitriptyline 10mg  at bedtime x 2 weeks, then increase to 2 tablets at bedtime May consider adding lidocaine ointment, if no improvement Fall precautions  discussed  Insulin-dependent diabetes, uncontrolled HbA1c 10.7 Strongly recommended diet control and tight glucose management to avoid progression of neuropathy  Chronic renal failure, stage IV Need to be sure her medications going forward are renally adjusted  Return to clinic in 3 months.   The duration of this appointment visit was 40 minutes of face-to-face time with the patient.  Greater than 50% of this time was spent in counseling, explanation of diagnosis, planning of further management, and coordination of care.   Thank you for allowing me to participate in patient's care.  If I can answer any additional questions, I would be pleased to do so.    Sincerely,    Donika K. Posey Pronto, DO

## 2015-03-27 ENCOUNTER — Encounter (HOSPITAL_COMMUNITY)
Admission: RE | Admit: 2015-03-27 | Discharge: 2015-03-27 | Disposition: A | Discharge status: Home / Self Care | Payer: Managed Care, Other (non HMO) | Source: Ambulatory Visit | Attending: Nephrology | Admitting: Nephrology

## 2015-03-27 DIAGNOSIS — N182 Chronic kidney disease, stage 2 (mild): Secondary | ICD-10-CM | POA: Diagnosis not present

## 2015-03-27 LAB — POCT HEMOGLOBIN-HEMACUE: Hemoglobin: 11.1 g/dL — ABNORMAL LOW (ref 12.0–15.0)

## 2015-03-27 MED ORDER — EPOETIN ALFA 40000 UNIT/ML IJ SOLN: 30000.0000 [IU] | INTRAMUSCULAR | Status: DC

## 2015-03-27 MED ORDER — EPOETIN ALFA 20000 UNIT/ML IJ SOLN
INTRAMUSCULAR | Status: AC
  Administered 2015-03-27: 20000 [IU]
  Filled 2015-03-27: qty 1

## 2015-03-27 MED ORDER — EPOETIN ALFA 10000 UNIT/ML IJ SOLN
INTRAMUSCULAR | Status: AC
  Administered 2015-03-27: 10000 [IU]
  Filled 2015-03-27: qty 1

## 2015-04-07 ENCOUNTER — Encounter: Payer: Self-pay | Admitting: Nurse Practitioner

## 2015-04-07 ENCOUNTER — Ambulatory Visit (INDEPENDENT_AMBULATORY_CARE_PROVIDER_SITE_OTHER): Payer: Managed Care, Other (non HMO) | Admitting: Nurse Practitioner

## 2015-04-07 VITALS — BP 136/82 | HR 88 | Temp 97.9°F | Ht 69.0 in | Wt 237.0 lb

## 2015-04-07 DIAGNOSIS — L0291 Cutaneous abscess, unspecified: Secondary | ICD-10-CM | POA: Diagnosis not present

## 2015-04-07 DIAGNOSIS — E1165 Type 2 diabetes mellitus with hyperglycemia: Secondary | ICD-10-CM

## 2015-04-07 DIAGNOSIS — IMO0002 Reserved for concepts with insufficient information to code with codable children: Secondary | ICD-10-CM

## 2015-04-07 DIAGNOSIS — E559 Vitamin D deficiency, unspecified: Secondary | ICD-10-CM

## 2015-04-07 DIAGNOSIS — Z114 Encounter for screening for human immunodeficiency virus [HIV]: Secondary | ICD-10-CM

## 2015-04-07 DIAGNOSIS — K5901 Slow transit constipation: Secondary | ICD-10-CM

## 2015-04-07 MED ORDER — POLYETHYLENE GLYCOL 1000 POWD: Status: DC

## 2015-04-07 MED ORDER — MUPIROCIN CALCIUM 2 % EX CREA: 1.0000 "application " | TOPICAL_CREAM | Freq: Two times a day (BID) | CUTANEOUS | Status: DC

## 2015-04-07 MED ORDER — POLYETHYLENE GLYCOL 3350 17 GM/SCOOP PO POWD: 1.0000 | Freq: Once | ORAL | Status: DC

## 2015-04-07 NOTE — Progress Notes (Signed)
Pre visit review using our clinic review tool, if applicable. No additional management support is needed unless otherwise documented below in the visit note. 

## 2015-04-07 NOTE — Patient Instructions (Signed)
Continue diabetes medicines. Blood sugar Goal if fasting under 200 & after eating under 250. You may increase levemir up to 17 units daily to keep blood sugars at goal.   For constipation: goal is to have BM every 3 days. Start miralax. Mix 1 heaping tablespoon in 1/2 cup water & drink. Repeat in 1 hour if no BM. Take up to 5 doses in 24 hours if needed. Repeat next day. If no BM, use soap on 3rd day. Let me know if no results. Repeat this regimen every 3 days as needed.  Start probiotic-take every day. Align or Culterelle.  I will see you in 2 weeks.

## 2015-04-07 NOTE — Progress Notes (Signed)
Subjective:     Heather [name] is a 44 y.o. female presents for f/u of dm, and has recurrent complaint of constipation, and new complaint of "sores behind ear". Dm: she has lost 6 lbs in 1 mo by increasing activty. [name] & walking w/husband. Current meds: 15 u levemir, 5u novolog TID. She has had no BS over 260 in last mo. & lowest BS was 90. She has also made diet changes.[name] motivated to continue with changes. [name] not on statin therapy. She has stage 4 kidney disease (sees Dr deterding& severe peripheral neuropathy.   Constipation: Hx gastroparesis. Onset several years. Has BM every 1 to 2 weeks. Cannot remember last BM today. Abdomen is uncomfortable. We discussed bowel regimen w/ referral to GI if no improvement. "sores behind ear": onset few days, tender. Pt reports Had "sores like this before-went to ER & they were drained". The following portions of the patient's history were reviewed and updated as appropriate: allergies, current medications, past medical history, past social history, past surgical history and problem list.  Review of Systems Pertinent items are noted in HPI.    Objective:    BP 136/82 mmHg  Pulse 88  Temp(Src) 97.9 F (36.6 C) (Oral)  Ht 5\' 9"  (1.753 m)  Wt 237 lb (107.502 kg)  BMI 34.98 kg/m2  SpO2 98%  LMP 04/01/2015 BP 136/82 mmHg  Pulse 88  Temp(Src) 97.9 F (36.6 C) (Oral)  Ht 5\' 9"  (1.753 m)  Wt 237 lb (107.502 kg)  BMI 34.98 kg/m2  SpO2 98%  LMP 04/01/2015 General appearance: alert, cooperative, appears stated age and no distress Head: Normocephalic, without obvious abnormality, atraumatic Eyes: negative findings: lids and lashes normal and conjunctivae and sclerae normal Lungs: clear to auscultation bilaterally Heart: regular rate and rhythm, S1, S2 normal, no murmur, click, rub or gallop Extremities: no edema, black discolration at nailbed on L great toe Skin: 2 lesions behind R ear, 1 lesion behind L  ear. All lesions drained-see procedure note. Neurologic: Mental status: Alert, oriented, thought content appropriate Sensory: pressure sense present feet bilateral bilaterally    Procedure: Pea size abscess behind R ear cleaned w/alcohol swab. Pierced abscess w/18ga needle. Purulent drainage expressed. Smaller lesion drained in same fashion behind r ear & behind L ear. Pt tolerated well.   Assessment:Plan     1. DM (diabetes mellitus), type 2, uncontrolled - Microalbumin / creatinine urine ratio - Comprehensive metabolic panel; Future - Hemoglobin A1c; Future - Lipid panel; Future - TSH; Future  2. Screening for HIV (human immunodeficiency virus) HIV antibody; Future  3. Vitamin D deficiency - Vit D  25 hydroxy (rtn osteoporosis monitoring); Future  4. Abscess See pt instructions for skin care - mupirocin cream (BACTROBAN) 2 %; Apply 1 application topically 2 (two) times daily.  Dispense: 15 g; Refill: 0  5. Slow transit constipation Will refer to GI if no improvement - Polyethylene Glycol 1000 POWD; Mix 1 heaping TBSP in 4 oz water PO. Repeat in 1 hr if no BM, up to 5 doses in 24 hours.  Dispense: 1 Bottle; Refill: 1

## 2015-04-08 LAB — MICROALBUMIN / CREATININE URINE RATIO
Creatinine,U: 94.9 mg/dL
Microalb Creat Ratio: 69.7 mg/g — ABNORMAL HIGH (ref 0.0–30.0)
Microalb, Ur: 66.1 mg/dL — ABNORMAL HIGH (ref 0.0–1.9)

## 2015-04-11 ENCOUNTER — Encounter (HOSPITAL_COMMUNITY)
Admission: RE | Admit: 2015-04-11 | Discharge: 2015-04-11 | Disposition: A | Discharge status: Home / Self Care | Payer: Managed Care, Other (non HMO) | Source: Ambulatory Visit | Attending: Nephrology | Admitting: Nephrology

## 2015-04-11 DIAGNOSIS — N182 Chronic kidney disease, stage 2 (mild): Secondary | ICD-10-CM | POA: Diagnosis present

## 2015-04-11 LAB — POCT HEMOGLOBIN-HEMACUE: Hemoglobin: 11.6 g/dL — ABNORMAL LOW (ref 12.0–15.0)

## 2015-04-11 MED ORDER — EPOETIN ALFA 20000 UNIT/ML IJ SOLN
INTRAMUSCULAR | Status: AC
  Administered 2015-04-11: 20000 [IU] via SUBCUTANEOUS
  Filled 2015-04-11: qty 1

## 2015-04-11 MED ORDER — EPOETIN ALFA 40000 UNIT/ML IJ SOLN: 30000.0000 [IU] | INTRAMUSCULAR | Status: DC

## 2015-04-11 MED ORDER — EPOETIN ALFA 10000 UNIT/ML IJ SOLN
INTRAMUSCULAR | Status: AC
  Administered 2015-04-11: 10000 [IU] via SUBCUTANEOUS
  Filled 2015-04-11: qty 1

## 2015-04-12 ENCOUNTER — Other Ambulatory Visit: Payer: Self-pay | Admitting: Nurse Practitioner

## 2015-04-12 DIAGNOSIS — G629 Polyneuropathy, unspecified: Secondary | ICD-10-CM

## 2015-04-12 DIAGNOSIS — N184 Chronic kidney disease, stage 4 (severe): Secondary | ICD-10-CM

## 2015-04-12 DIAGNOSIS — I10 Essential (primary) hypertension: Secondary | ICD-10-CM

## 2015-04-12 LAB — IRON AND TIBC
Iron: 60 ug/dL (ref 42–145)
Saturation Ratios: 34 % (ref 20–55)
TIBC: 175 ug/dL — ABNORMAL LOW (ref 250–470)
UIBC: 115 ug/dL — ABNORMAL LOW (ref 125–400)

## 2015-04-12 LAB — FERRITIN: Ferritin: 209 ng/mL (ref 10–291)

## 2015-04-12 MED ORDER — FUROSEMIDE 80 MG PO TABS: 80.0000 mg | ORAL_TABLET | Freq: Two times a day (BID) | ORAL | Status: DC

## 2015-04-12 MED ORDER — GABAPENTIN 300 MG PO CAPS: 300.0000 mg | ORAL_CAPSULE | Freq: Two times a day (BID) | ORAL | Status: DC | PRN

## 2015-04-12 MED ORDER — LISINOPRIL 20 MG PO TABS: 20.0000 mg | ORAL_TABLET | Freq: Every day | ORAL | Status: DC

## 2015-04-17 ENCOUNTER — Ambulatory Visit (INDEPENDENT_AMBULATORY_CARE_PROVIDER_SITE_OTHER): Payer: Managed Care, Other (non HMO) | Admitting: Nurse Practitioner

## 2015-04-17 ENCOUNTER — Telehealth: Payer: Self-pay

## 2015-04-17 ENCOUNTER — Telehealth: Payer: Self-pay | Admitting: Nurse Practitioner

## 2015-04-17 ENCOUNTER — Emergency Department (HOSPITAL_COMMUNITY)
Admission: EM | Admit: 2015-04-17 | Discharge: 2015-04-17 | Discharge status: Against Medical Advice | Payer: Managed Care, Other (non HMO) | Attending: Emergency Medicine | Admitting: Emergency Medicine

## 2015-04-17 ENCOUNTER — Other Ambulatory Visit: Payer: Self-pay | Admitting: Nurse Practitioner

## 2015-04-17 ENCOUNTER — Encounter (HOSPITAL_COMMUNITY): Payer: Self-pay | Admitting: *Deleted

## 2015-04-17 ENCOUNTER — Encounter: Payer: Self-pay | Admitting: Nurse Practitioner

## 2015-04-17 VITALS — BP 128/65 | HR 98 | Temp 98.1°F | Ht 69.0 in | Wt 238.0 lb

## 2015-04-17 DIAGNOSIS — E559 Vitamin D deficiency, unspecified: Secondary | ICD-10-CM

## 2015-04-17 DIAGNOSIS — Z114 Encounter for screening for human immunodeficiency virus [HIV]: Secondary | ICD-10-CM | POA: Diagnosis not present

## 2015-04-17 DIAGNOSIS — N189 Chronic kidney disease, unspecified: Secondary | ICD-10-CM | POA: Diagnosis not present

## 2015-04-17 DIAGNOSIS — E1165 Type 2 diabetes mellitus with hyperglycemia: Secondary | ICD-10-CM

## 2015-04-17 DIAGNOSIS — R109 Unspecified abdominal pain: Secondary | ICD-10-CM

## 2015-04-17 DIAGNOSIS — IMO0002 Reserved for concepts with insufficient information to code with codable children: Secondary | ICD-10-CM

## 2015-04-17 DIAGNOSIS — I129 Hypertensive chronic kidney disease with stage 1 through stage 4 chronic kidney disease, or unspecified chronic kidney disease: Secondary | ICD-10-CM | POA: Insufficient documentation

## 2015-04-17 DIAGNOSIS — K5909 Other constipation: Secondary | ICD-10-CM | POA: Diagnosis not present

## 2015-04-17 LAB — COMPREHENSIVE METABOLIC PANEL
ALBUMIN: 3.6 g/dL (ref 3.5–5.2)
ALT: 16 U/L (ref 0–35)
ALT: 18 U/L (ref 0–35)
AST: 11 U/L (ref 0–37)
AST: 16 U/L (ref 0–37)
Albumin: 3.2 g/dL — ABNORMAL LOW (ref 3.5–5.2)
Alkaline Phosphatase: 89 U/L (ref 39–117)
Alkaline Phosphatase: 74 U/L (ref 39–117)
Anion gap: 9 (ref 5–15)
BUN: 47 mg/dL — AB (ref 6–23)
BUN: 48 mg/dL — ABNORMAL HIGH (ref 6–23)
CALCIUM: 8.6 mg/dL (ref 8.4–10.5)
CALCIUM: 8.3 mg/dL — AB (ref 8.4–10.5)
CHLORIDE: 95 meq/L — AB (ref 96–112)
CO2: 26 meq/L (ref 19–32)
CO2: 22 mmol/L (ref 19–32)
Chloride: 100 mmol/L (ref 96–112)
Creatinine, Ser: 3.58 mg/dL — ABNORMAL HIGH (ref 0.40–1.20)
Creatinine, Ser: 3.73 mg/dL — ABNORMAL HIGH (ref 0.50–1.10)
GFR, EST AFRICAN AMERICAN: 16 mL/min — AB (ref >90)
GFR, EST NON AFRICAN AMERICAN: 14 mL/min — AB (ref >90)
GFR: 17.67 mL/min — ABNORMAL LOW (ref >60.00)
Glucose, Bld: 589 mg/dL (ref 70–99)
Glucose, Bld: 392 mg/dL — ABNORMAL HIGH (ref 70–99)
POTASSIUM: 5.1 meq/L (ref 3.5–5.1)
POTASSIUM: 4.5 mmol/L (ref 3.5–5.1)
Sodium: 127 mEq/L — ABNORMAL LOW (ref 135–145)
Sodium: 131 mmol/L — ABNORMAL LOW (ref 135–145)
Total Bilirubin: 0.2 mg/dL (ref 0.2–1.2)
Total Bilirubin: 0.3 mg/dL (ref 0.3–1.2)
Total Protein: 7.4 g/dL (ref 6.0–8.3)
Total Protein: 6.6 g/dL (ref 6.0–8.3)

## 2015-04-17 LAB — URINALYSIS, ROUTINE W REFLEX MICROSCOPIC
Bilirubin Urine: NEGATIVE
Glucose, UA: >1000 mg/dL — AB
HGB URINE DIPSTICK: NEGATIVE
Ketones, ur: NEGATIVE mg/dL
Nitrite: NEGATIVE
PH: 6 (ref 5.0–8.0)
PROTEIN: 100 mg/dL — AB
SPECIFIC GRAVITY, URINE: 1.025 (ref 1.005–1.030)
Urobilinogen, UA: 0.2 mg/dL (ref 0.0–1.0)

## 2015-04-17 LAB — URINE MICROSCOPIC-ADD ON

## 2015-04-17 LAB — CBC
HCT: 35.3 % — ABNORMAL LOW (ref 36.0–46.0)
Hemoglobin: 11.3 g/dL — ABNORMAL LOW (ref 12.0–15.0)
MCH: 28.3 pg (ref 26.0–34.0)
MCHC: 32 g/dL (ref 30.0–36.0)
MCV: 88.3 fL (ref 78.0–100.0)
PLATELETS: 323 10*3/uL (ref 150–400)
RBC: 4 MIL/uL (ref 3.87–5.11)
RDW: 14 % (ref 11.5–15.5)
WBC: 7.8 10*3/uL (ref 4.0–10.5)

## 2015-04-17 LAB — HEMOGLOBIN A1C: Hgb A1c MFr Bld: 11.2 % — ABNORMAL HIGH (ref 4.6–6.5)

## 2015-04-17 LAB — POCT URINALYSIS DIPSTICK
Bilirubin, UA: NEGATIVE
Ketones, UA: NEGATIVE
LEUKOCYTES UA: NEGATIVE
NITRITE UA: NEGATIVE
PROTEIN UA: 100
Spec Grav, UA: 1.015
Urobilinogen, UA: 0.2
pH, UA: 5

## 2015-04-17 LAB — LIPID PANEL
Cholesterol: 220 mg/dL — ABNORMAL HIGH (ref 0–200)
HDL: 31.3 mg/dL — ABNORMAL LOW (ref >39.00)
Total CHOL/HDL Ratio: 7

## 2015-04-17 LAB — LDL CHOLESTEROL, DIRECT: Direct LDL: 120 mg/dL

## 2015-04-17 LAB — POC URINE PREG, ED: PREG TEST UR: NEGATIVE

## 2015-04-17 LAB — CBG MONITORING, ED: GLUCOSE-CAPILLARY: 354 mg/dL — AB (ref 70–99)

## 2015-04-17 LAB — TSH: TSH: 0.71 u[IU]/mL (ref 0.35–4.50)

## 2015-04-17 LAB — VITAMIN D 25 HYDROXY (VIT D DEFICIENCY, FRACTURES): VITD: 7.7 ng/mL — ABNORMAL LOW (ref 30.00–100.00)

## 2015-04-17 NOTE — Telephone Encounter (Signed)
Lab from [facility] calling about critical result. Glucose was critical at 589. Heather [name] was texted about this value on 04/17/15 at 447p since she is not in the office.

## 2015-04-17 NOTE — Telephone Encounter (Addendum)
Patient called back and would like to be seen today. Per her husband she was running a fever, but she doesn't know how high it was. [name] going to talk with supervisor and CB to schedule an appointment.

## 2015-04-17 NOTE — ED Notes (Signed)
Called pt twice with no answer to be placed in room.

## 2015-04-17 NOTE — Patient Instructions (Addendum)
Continue diabetes medicines. Blood sugar Goal if fasting under 200 & after eating under 250. To keep blood sugars at goal:   Increase levemir by 2 units daily, every 3 days. As you increase Levemir, Increase novolog by 1 unit with each dose. So if Levemir goes up 2 units, Novolog will go up 3 units in 24 hours. If blood sugar is not getting closer to goals with increases in insulin within 2 weeks, call me for office visit. If you are running fever or not feeling well, call for office visit.  For constipation: goal is to have BM every 3 days. Start miralax. Mix 1 heaping tablespoon in 1/2 cup water & drink. Repeat in 1 hour if no BM. Take up to 5 doses in 24 hours if needed. Repeat next day. If no BM, use soap enema on 3rd day. Let me know if no results. Repeat this regimen every 3 days as needed.  Start probiotic-take every day. Align or Culterelle.  My office will call with lab results.

## 2015-04-17 NOTE — Telephone Encounter (Signed)
LMOVM for patient to [facility]

## 2015-04-17 NOTE — ED Notes (Signed)
Pt states that she had blood work done this morning and her PCP called her because her glucose was in the 600's. Pt states that she has not been feeling well today. Pt states that she takes Novolog and Levemir.

## 2015-04-17 NOTE — Telephone Encounter (Signed)
-----   Message from Irene Pap, NP sent at 04/17/2015  2:54 PM EDT ----- pls call [facility] & tell her humalog is similar to novalog but may be cheaper. Does she want to try the humalog pens? If pens are not cheaper, the solution should be. Does she want to switch?  ----- Message -----    From: Audley Hose, CMA    Sent: 04/17/2015   2:27 PM      To: Irene Pap, NP  Humalog is cheaper  ----- Message -----    From: Irene Pap, NP    Sent: 04/17/2015   1:00 PM      To: Audley Hose, CMA  Pls call [employer] & see if humalog is cheaper than novolog for her.

## 2015-04-17 NOTE — Progress Notes (Signed)
Pre visit review using our clinic review tool, if applicable. No additional management support is needed unless otherwise documented below in the visit note. 

## 2015-04-17 NOTE — ED Notes (Signed)
Patient called for a room x1. No answer.

## 2015-04-17 NOTE — Telephone Encounter (Signed)
pls call pt, ask: If she has fever? If she has fever, she should come in for OV. If no fever, she needs bowel rest: Start liquid diet for 3 days: beverages, soup, yogurt, smoothies, jello, apple sauce, fresh fruit. She must check blood sugars, because they will likely be lower & will need less insulin. Then call her next week to ask if pain better.  When was last BM? If its been several days, Use enema or miralax as I directed in instructions at last OV. And liquid diet above.  Is she taking levemir AND novolog?

## 2015-04-17 NOTE — Progress Notes (Signed)
Subjective:     Heather [name] is a 44 y.o. female who presents for evaluation of abdominal pain. [name] accompanied by her husband today. Onset was 1 day ago. Symptoms have been stable. The pain is described as aching, and is 4/10 in intensity. Pain is located in the L flank & bilateral sides of abdomen without radiation.  Aggravating factors: none.  Alleviating factors: none. Associated symptoms: fever-not measured, constipation, elevated blood sugars for 3 days-in 500's. The patient denies belching, diarrhea, nausea and vomiting. Pt has Hx of gastroparesis. Last BM was 4 da. Reviewed DR Brodie's note 2012: endo-erosive gastritis H pylori pos. Treated w/AB. No f/u. The patient's history has been marked as reviewed and updated as appropriate.  Review of Systems Pertinent items are noted in HPI.     Objective:    BP 128/65 mmHg  Pulse 98  Temp(Src) 98.1 F (36.7 C) (Oral)  Ht 5\' 9"  (1.753 m)  Wt 238 lb (107.956 kg)  BMI 35.13 kg/m2  SpO2 97%  LMP 04/01/2015 General appearance: alert, cooperative, appears stated age and appears sleepy Head: Normocephalic, without obvious abnormality, atraumatic Eyes: negative findings: lids and lashes normal and conjunctivae and sclerae normal Lungs: clear to auscultation bilaterally Heart: regular rate and rhythm, S1, S2 normal, no murmur, click, rub or gallop Abdomen: normal findings: no masses palpable and abnormal findings:  obese, tender LUQ Extremities: extremities normal, atraumatic, no cyanosis or edema Skin: lesions behind ear are healing, no exudate. Drained last OV. Neurologic: Mental status: Alert, oriented, thought content appropriate, sleepy-had head on desk when walked into room. Gait: pronated feet   Back: no CVA tenderness Assessment:Plan  1. DM (diabetes mellitus), type 2, uncontrolled BS in 500's this week. Sick? UTI? Diverticulitis? - Comprehensive metabolic panel - Hemoglobin A1c - Lipid panel - TSH  2. Screening for HIV  (human immunodeficiency virus) - HIV antibody  3. Vitamin D deficiency CKD stage 4 - Vit D  25 hydroxy (rtn osteoporosis monitoring)  4. Left flank pain UTI? - POCT urinalysis dipstick - Urine culture  5. Other constipation See pt instructions Stressed importance of following bowel regimen & let me know if constipation not manageable. If not will need to see GI.

## 2015-04-17 NOTE — Telephone Encounter (Signed)
LMOVM asking patient to CB 

## 2015-04-18 ENCOUNTER — Telehealth: Payer: Self-pay | Admitting: Nurse Practitioner

## 2015-04-18 ENCOUNTER — Ambulatory Visit: Payer: Managed Care, Other (non HMO) | Admitting: Nurse Practitioner

## 2015-04-18 DIAGNOSIS — N3001 Acute cystitis with hematuria: Secondary | ICD-10-CM

## 2015-04-18 LAB — HIV ANTIBODY (ROUTINE TESTING W REFLEX): HIV 1&2 Ab, 4th Generation: NONREACTIVE

## 2015-04-18 MED ORDER — CIPROFLOXACIN HCL 250 MG PO TABS: 250.0000 mg | ORAL_TABLET | ORAL | Status: DC

## 2015-04-18 NOTE — Telephone Encounter (Signed)
DPR signed. LMOVM informing patient of results that abx was sent to [facility]. Told patient to CB with any concerns.

## 2015-04-18 NOTE — Telephone Encounter (Signed)
BS 580's. Advised pt to go to ER for fluid resuscitation. She agreed.

## 2015-04-18 NOTE — Telephone Encounter (Signed)
pls call pt: Advise She has uti. Start abx today. Sent to [facility] on [address], would she rather it go to [facility] on [address]?

## 2015-04-21 LAB — URINE CULTURE: Colony Count: >=100000

## 2015-04-25 ENCOUNTER — Encounter (HOSPITAL_COMMUNITY): Payer: Managed Care, Other (non HMO)

## 2015-04-30 ENCOUNTER — Telehealth: Payer: Self-pay | Admitting: Nurse Practitioner

## 2015-04-30 ENCOUNTER — Other Ambulatory Visit: Payer: Self-pay | Admitting: Nurse Practitioner

## 2015-04-30 ENCOUNTER — Ambulatory Visit: Payer: Managed Care, Other (non HMO) | Admitting: Nurse Practitioner

## 2015-04-30 DIAGNOSIS — F419 Anxiety disorder, unspecified: Secondary | ICD-10-CM

## 2015-04-30 DIAGNOSIS — E785 Hyperlipidemia, unspecified: Secondary | ICD-10-CM

## 2015-04-30 DIAGNOSIS — E118 Type 2 diabetes mellitus with unspecified complications: Secondary | ICD-10-CM

## 2015-04-30 MED ORDER — ATORVASTATIN CALCIUM 10 MG PO TABS
10.0000 mg | ORAL_TABLET | Freq: Every day | ORAL | Status: DC
Start: 2015-04-30 — End: 2015-04-30

## 2015-04-30 MED ORDER — INSULIN LISPRO 100 UNIT/ML ~~LOC~~ SOLN: 10.0000 [IU] | Freq: Three times a day (TID) | SUBCUTANEOUS | Status: DC

## 2015-04-30 MED ORDER — ATORVASTATIN CALCIUM 10 MG PO TABS: 10.0000 mg | ORAL_TABLET | Freq: Every day | ORAL | Status: DC

## 2015-04-30 MED ORDER — INSULIN DETEMIR 100 UNIT/ML ~~LOC~~ SOLN: 19.0000 [IU] | Freq: Every day | SUBCUTANEOUS | Status: DC

## 2015-04-30 MED ORDER — LORAZEPAM 0.5 MG PO TABS: 0.5000 mg | ORAL_TABLET | Freq: Every day | ORAL | Status: DC | PRN

## 2015-04-30 NOTE — Telephone Encounter (Signed)
Spoke w/patient: Reviewed [facility] notes dated 04/21/25-04/23/15. Pt admitted for L arn, chest pain & L facial numbness. Went over all meds, tests.No cause found for pain. Pt states pain was present for about 4 days, intermittently, then became constant. BS was over 500 when arrived at hospital. [facility] if anxiety is part of pain? Advised to take ativan if pain occurs again. BS was 154 today. Highest was 254 this week. She has been using levemir w/sliding scale, rather than lispro as instructed in hospital. She has been taking 15 to 19 u levemir qd & 10 u novolog tid. She feels well. Start statin. Continue asa qd. Change insulin to 19u levemir qd , 10 u humalog tid. Continue all other meds as instructed in hospital. Answered all questions. F/u in 6 weeks.

## 2015-04-30 NOTE — Telephone Encounter (Signed)
Recent cp, admission [facility] Requested records for review.

## 2015-05-05 ENCOUNTER — Encounter (HOSPITAL_COMMUNITY)
Admission: RE | Admit: 2015-05-05 | Discharge: 2015-05-05 | Disposition: A | Discharge status: Home / Self Care | Payer: Managed Care, Other (non HMO) | Source: Ambulatory Visit | Attending: Nephrology | Admitting: Nephrology

## 2015-05-05 DIAGNOSIS — N182 Chronic kidney disease, stage 2 (mild): Secondary | ICD-10-CM | POA: Insufficient documentation

## 2015-05-05 LAB — IRON AND TIBC
IRON: 60 ug/dL (ref 28–170)
Saturation Ratios: 22 % (ref 10.4–31.8)
TIBC: 270 ug/dL (ref 250–450)
UIBC: 210 ug/dL

## 2015-05-05 LAB — FERRITIN: Ferritin: 192 ng/mL (ref 11–307)

## 2015-05-05 LAB — POCT HEMOGLOBIN-HEMACUE: HEMOGLOBIN: 11 g/dL — AB (ref 12.0–15.0)

## 2015-05-05 MED ORDER — EPOETIN ALFA 20000 UNIT/ML IJ SOLN
INTRAMUSCULAR | Status: AC
  Administered 2015-05-05: 20000 [IU] via SUBCUTANEOUS
  Filled 2015-05-05: qty 1

## 2015-05-05 MED ORDER — EPOETIN ALFA 40000 UNIT/ML IJ SOLN: 30000.0000 [IU] | INTRAMUSCULAR | Status: DC

## 2015-05-05 MED ORDER — EPOETIN ALFA 10000 UNIT/ML IJ SOLN
INTRAMUSCULAR | Status: AC
  Administered 2015-05-05: 10000 [IU] via SUBCUTANEOUS
  Filled 2015-05-05: qty 1

## 2015-05-16 ENCOUNTER — Telehealth: Payer: Self-pay | Admitting: Nurse Practitioner

## 2015-05-16 ENCOUNTER — Other Ambulatory Visit (HOSPITAL_COMMUNITY): Payer: Self-pay | Admitting: *Deleted

## 2015-05-16 NOTE — Telephone Encounter (Addendum)
Called and spoke with patient. Went to get medicine to check and see, doesn't remember the medicine that she they needed to contact insurance about. But it was just about calling in the Lorazepam that they didn't have. I informed her that it was called in and if the pharm gives her any trouble in picking it up to CB.

## 2015-05-16 NOTE — Telephone Encounter (Signed)
pls call pharmacy. Ask if they have script for ativan. Pt says they do not have it.

## 2015-05-16 NOTE — Telephone Encounter (Signed)
rx called into pharmacy. The ativan was originally sent to [facility].

## 2015-05-19 ENCOUNTER — Ambulatory Visit: Payer: Managed Care, Other (non HMO) | Admitting: Nurse Practitioner

## 2015-05-19 ENCOUNTER — Encounter (HOSPITAL_COMMUNITY)
Admission: RE | Admit: 2015-05-19 | Discharge: 2015-05-19 | Disposition: A | Discharge status: Home / Self Care | Payer: Managed Care, Other (non HMO) | Source: Ambulatory Visit | Attending: Nephrology | Admitting: Nephrology

## 2015-05-19 DIAGNOSIS — N182 Chronic kidney disease, stage 2 (mild): Secondary | ICD-10-CM | POA: Diagnosis not present

## 2015-05-19 LAB — POCT HEMOGLOBIN-HEMACUE: Hemoglobin: 10.2 g/dL — ABNORMAL LOW (ref 12.0–15.0)

## 2015-05-19 MED ORDER — EPOETIN ALFA 40000 UNIT/ML IJ SOLN: 30000.0000 [IU] | INTRAMUSCULAR | Status: DC

## 2015-05-19 MED ORDER — EPOETIN ALFA 10000 UNIT/ML IJ SOLN
INTRAMUSCULAR | Status: AC
  Administered 2015-05-19: 10000 [IU]
  Filled 2015-05-19: qty 1

## 2015-05-19 MED ORDER — EPOETIN ALFA 20000 UNIT/ML IJ SOLN
INTRAMUSCULAR | Status: AC
  Administered 2015-05-19: 20000 [IU]
  Filled 2015-05-19: qty 1

## 2015-05-19 MED ORDER — SODIUM CHLORIDE 0.9 % IV SOLN
510.0000 mg | Freq: Once | INTRAVENOUS | Status: AC
  Administered 2015-05-19: 510 mg via INTRAVENOUS
  Filled 2015-05-19: qty 17

## 2015-05-30 DIAGNOSIS — Z961 Presence of intraocular lens: Secondary | ICD-10-CM | POA: Insufficient documentation

## 2015-06-02 ENCOUNTER — Encounter (HOSPITAL_COMMUNITY)
Admission: RE | Admit: 2015-06-02 | Discharge: 2015-06-02 | Disposition: A | Discharge status: Home / Self Care | Payer: Managed Care, Other (non HMO) | Source: Ambulatory Visit | Attending: Nephrology | Admitting: Nephrology

## 2015-06-02 DIAGNOSIS — N182 Chronic kidney disease, stage 2 (mild): Secondary | ICD-10-CM | POA: Diagnosis present

## 2015-06-02 LAB — IRON AND TIBC
IRON: 63 ug/dL (ref 28–170)
SATURATION RATIOS: 26 % (ref 10.4–31.8)
TIBC: 242 ug/dL — ABNORMAL LOW (ref 250–450)
UIBC: 179 ug/dL

## 2015-06-02 LAB — FERRITIN: FERRITIN: 412 ng/mL — AB (ref 11–307)

## 2015-06-02 LAB — POCT HEMOGLOBIN-HEMACUE: Hemoglobin: 11 g/dL — ABNORMAL LOW (ref 12.0–15.0)

## 2015-06-02 MED ORDER — EPOETIN ALFA 40000 UNIT/ML IJ SOLN
30000.0000 [IU] | INTRAMUSCULAR | Status: DC
  Administered 2015-06-02: 30000 [IU] via SUBCUTANEOUS

## 2015-06-03 MED FILL — Epoetin Alfa Inj 20000 Unit/ML: INTRAMUSCULAR | Qty: 1 | Status: AC

## 2015-06-03 MED FILL — Epoetin Alfa Inj 10000 Unit/ML: INTRAMUSCULAR | Qty: 1 | Status: AC

## 2015-06-11 ENCOUNTER — Ambulatory Visit: Payer: Managed Care, Other (non HMO) | Admitting: Nurse Practitioner

## 2015-06-12 ENCOUNTER — Other Ambulatory Visit: Payer: Self-pay | Admitting: Nurse Practitioner

## 2015-06-12 ENCOUNTER — Telehealth: Payer: Self-pay | Admitting: Nurse Practitioner

## 2015-06-12 NOTE — Telephone Encounter (Signed)
LMOM for pt to CB.  

## 2015-06-12 NOTE — Telephone Encounter (Signed)
pls call pt: Ask what kind of test strips she needs & place order. Ask if she is going to reschedule appt. She missed last 2.

## 2015-06-13 ENCOUNTER — Other Ambulatory Visit (HOSPITAL_COMMUNITY): Payer: Self-pay | Admitting: *Deleted

## 2015-06-13 NOTE — Telephone Encounter (Signed)
Patient states she uses [facility].  I will call into pharmacy.  Pt will not reschedule last appointment missed because nothing is wrong.  She states she will come to her next appointment on 07/17/15.

## 2015-06-16 ENCOUNTER — Ambulatory Visit: Payer: Managed Care, Other (non HMO) | Admitting: Neurology

## 2015-06-16 ENCOUNTER — Encounter (HOSPITAL_COMMUNITY)
Admission: RE | Admit: 2015-06-16 | Discharge: 2015-06-16 | Disposition: A | Discharge status: Home / Self Care | Payer: Managed Care, Other (non HMO) | Source: Ambulatory Visit | Attending: Nephrology | Admitting: Nephrology

## 2015-06-16 DIAGNOSIS — N182 Chronic kidney disease, stage 2 (mild): Secondary | ICD-10-CM | POA: Diagnosis not present

## 2015-06-16 LAB — POCT HEMOGLOBIN-HEMACUE: Hemoglobin: 11.8 g/dL — ABNORMAL LOW (ref 12.0–15.0)

## 2015-06-16 MED ORDER — EPOETIN ALFA 10000 UNIT/ML IJ SOLN
INTRAMUSCULAR | Status: AC
  Administered 2015-06-16: 10000 [IU]
  Filled 2015-06-16: qty 1

## 2015-06-16 MED ORDER — EPOETIN ALFA 20000 UNIT/ML IJ SOLN
INTRAMUSCULAR | Status: AC
  Administered 2015-06-16: 20000 [IU]
  Filled 2015-06-16: qty 1

## 2015-06-16 MED ORDER — FERUMOXYTOL INJECTION 510 MG/17 ML
510.0000 mg | Freq: Once | INTRAVENOUS | Status: AC
  Administered 2015-06-16: 510 mg via INTRAVENOUS
  Filled 2015-06-16: qty 17

## 2015-06-16 MED ORDER — EPOETIN ALFA 40000 UNIT/ML IJ SOLN: 30000.0000 [IU] | INTRAMUSCULAR | Status: DC

## 2015-06-23 ENCOUNTER — Emergency Department (HOSPITAL_BASED_OUTPATIENT_CLINIC_OR_DEPARTMENT_OTHER)
Admission: EM | Admit: 2015-06-23 | Discharge: 2015-06-23 | Disposition: A | Discharge status: Home / Self Care | Payer: Managed Care, Other (non HMO) | Attending: Emergency Medicine | Admitting: Emergency Medicine

## 2015-06-23 ENCOUNTER — Encounter (HOSPITAL_BASED_OUTPATIENT_CLINIC_OR_DEPARTMENT_OTHER): Payer: Self-pay

## 2015-06-23 DIAGNOSIS — Z794 Long term (current) use of insulin: Secondary | ICD-10-CM | POA: Insufficient documentation

## 2015-06-23 DIAGNOSIS — E860 Dehydration: Secondary | ICD-10-CM

## 2015-06-23 DIAGNOSIS — I129 Hypertensive chronic kidney disease with stage 1 through stage 4 chronic kidney disease, or unspecified chronic kidney disease: Secondary | ICD-10-CM | POA: Insufficient documentation

## 2015-06-23 DIAGNOSIS — Z86018 Personal history of other benign neoplasm: Secondary | ICD-10-CM | POA: Insufficient documentation

## 2015-06-23 DIAGNOSIS — Z79899 Other long term (current) drug therapy: Secondary | ICD-10-CM | POA: Insufficient documentation

## 2015-06-23 DIAGNOSIS — N189 Chronic kidney disease, unspecified: Secondary | ICD-10-CM | POA: Diagnosis not present

## 2015-06-23 DIAGNOSIS — M797 Fibromyalgia: Secondary | ICD-10-CM | POA: Insufficient documentation

## 2015-06-23 DIAGNOSIS — E1165 Type 2 diabetes mellitus with hyperglycemia: Secondary | ICD-10-CM | POA: Diagnosis present

## 2015-06-23 DIAGNOSIS — N289 Disorder of kidney and ureter, unspecified: Secondary | ICD-10-CM

## 2015-06-23 DIAGNOSIS — Z87440 Personal history of urinary (tract) infections: Secondary | ICD-10-CM | POA: Diagnosis not present

## 2015-06-23 DIAGNOSIS — Z791 Long term (current) use of non-steroidal anti-inflammatories (NSAID): Secondary | ICD-10-CM | POA: Diagnosis not present

## 2015-06-23 DIAGNOSIS — Z862 Personal history of diseases of the blood and blood-forming organs and certain disorders involving the immune mechanism: Secondary | ICD-10-CM | POA: Diagnosis not present

## 2015-06-23 DIAGNOSIS — R739 Hyperglycemia, unspecified: Secondary | ICD-10-CM

## 2015-06-23 LAB — URINALYSIS, ROUTINE W REFLEX MICROSCOPIC
BILIRUBIN URINE: NEGATIVE
Glucose, UA: >1000 mg/dL — AB
Hgb urine dipstick: NEGATIVE
Ketones, ur: NEGATIVE mg/dL
Leukocytes, UA: NEGATIVE
Nitrite: NEGATIVE
PH: 5.5 (ref 5.0–8.0)
Protein, ur: 100 mg/dL — AB
SPECIFIC GRAVITY, URINE: 1.021 (ref 1.005–1.030)
UROBILINOGEN UA: 0.2 mg/dL (ref 0.0–1.0)

## 2015-06-23 LAB — BASIC METABOLIC PANEL
Anion gap: 10 (ref 5–15)
BUN: 49 mg/dL — ABNORMAL HIGH (ref 6–20)
CO2: 23 mmol/L (ref 22–32)
CREATININE: 3.73 mg/dL — AB (ref 0.44–1.00)
Calcium: 9 mg/dL (ref 8.9–10.3)
Chloride: 99 mmol/L — ABNORMAL LOW (ref 101–111)
GFR, EST AFRICAN AMERICAN: 16 mL/min — AB (ref >60)
GFR, EST NON AFRICAN AMERICAN: 14 mL/min — AB (ref >60)
Glucose, Bld: 306 mg/dL — ABNORMAL HIGH (ref 65–99)
Potassium: 4.5 mmol/L (ref 3.5–5.1)
SODIUM: 132 mmol/L — AB (ref 135–145)

## 2015-06-23 LAB — COMPREHENSIVE METABOLIC PANEL
ALT: 18 U/L (ref 14–54)
AST: 18 U/L (ref 15–41)
Albumin: 3.4 g/dL — ABNORMAL LOW (ref 3.5–5.0)
Alkaline Phosphatase: 90 U/L (ref 38–126)
Anion gap: 11 (ref 5–15)
BUN: 51 mg/dL — ABNORMAL HIGH (ref 6–20)
CALCIUM: 8.9 mg/dL (ref 8.9–10.3)
CO2: 23 mmol/L (ref 22–32)
Chloride: 93 mmol/L — ABNORMAL LOW (ref 101–111)
Creatinine, Ser: 4.13 mg/dL — ABNORMAL HIGH (ref 0.44–1.00)
GFR calc non Af Amer: 12 mL/min — ABNORMAL LOW (ref >60)
GFR, EST AFRICAN AMERICAN: 14 mL/min — AB (ref >60)
Glucose, Bld: 524 mg/dL — ABNORMAL HIGH (ref 65–99)
Potassium: 4.6 mmol/L (ref 3.5–5.1)
Sodium: 127 mmol/L — ABNORMAL LOW (ref 135–145)
TOTAL PROTEIN: 7.6 g/dL (ref 6.5–8.1)
Total Bilirubin: 0.6 mg/dL (ref 0.3–1.2)

## 2015-06-23 LAB — CBC
HEMATOCRIT: 38.8 % (ref 36.0–46.0)
Hemoglobin: 12.4 g/dL (ref 12.0–15.0)
MCH: 28.4 pg (ref 26.0–34.0)
MCHC: 32 g/dL (ref 30.0–36.0)
MCV: 88.8 fL (ref 78.0–100.0)
PLATELETS: 293 10*3/uL (ref 150–400)
RBC: 4.37 MIL/uL (ref 3.87–5.11)
RDW: 14 % (ref 11.5–15.5)
WBC: 6.3 10*3/uL (ref 4.0–10.5)

## 2015-06-23 LAB — TROPONIN I

## 2015-06-23 LAB — I-STAT VENOUS BLOOD GAS, ED
Acid-base deficit: 1 mmol/L (ref 0.0–2.0)
BICARBONATE: 24.8 meq/L — AB (ref 20.0–24.0)
O2 Saturation: 79 %
PCO2 VEN: 45.1 mmHg (ref 45.0–50.0)
TCO2: 26 mmol/L (ref 0–100)
pH, Ven: 7.348 — ABNORMAL HIGH (ref 7.250–7.300)
pO2, Ven: 46 mmHg — ABNORMAL HIGH (ref 30.0–45.0)

## 2015-06-23 LAB — URINE MICROSCOPIC-ADD ON

## 2015-06-23 LAB — CBG MONITORING, ED
GLUCOSE-CAPILLARY: 354 mg/dL — AB (ref 65–99)
GLUCOSE-CAPILLARY: 171 mg/dL — AB (ref 65–99)
Glucose-Capillary: 523 mg/dL — ABNORMAL HIGH (ref 65–99)
Glucose-Capillary: 316 mg/dL — ABNORMAL HIGH (ref 65–99)
Glucose-Capillary: 235 mg/dL — ABNORMAL HIGH (ref 65–99)

## 2015-06-23 LAB — PREGNANCY, URINE: Preg Test, Ur: NEGATIVE

## 2015-06-23 MED ORDER — SODIUM CHLORIDE 0.9 % IV SOLN
1000.0000 mL | Freq: Once | INTRAVENOUS | Status: AC
  Administered 2015-06-23: 1000 mL via INTRAVENOUS

## 2015-06-23 MED ORDER — SODIUM CHLORIDE 0.9 % IV SOLN
1000.0000 mL | INTRAVENOUS | Status: DC
  Administered 2015-06-23: 1000 mL via INTRAVENOUS

## 2015-06-23 MED ORDER — DEXTROSE-NACL 5-0.45 % IV SOLN: INTRAVENOUS | Status: DC

## 2015-06-23 MED ORDER — METOCLOPRAMIDE HCL 5 MG/ML IJ SOLN
10.0000 mg | Freq: Once | INTRAMUSCULAR | Status: AC
Start: 2015-06-23 — End: 2015-06-23
  Administered 2015-06-23: 10 mg via INTRAVENOUS
  Filled 2015-06-23: qty 2

## 2015-06-23 MED ORDER — SODIUM CHLORIDE 0.9 % IV SOLN
INTRAVENOUS | Status: DC
  Administered 2015-06-23: 2.9 [IU]/h via INTRAVENOUS

## 2015-06-23 NOTE — ED Provider Notes (Signed)
CSN: [id number]     Arrival date & time 06/23/15  1222 History   First MD Initiated Contact with Patient 06/23/15 1316     Chief Complaint  Patient presents with  . Hyperglycemia     (Consider location/radiation/quality/duration/timing/severity/associated sxs/prior Treatment) HPI Patient complains of generalized weakness and lightheadedness onset 2 days ago. She was noncompliant with her insulin 2 days ago. Other associated symptoms include diffuse headache, polydipsia and nausea. No chest pain no abdominal pain no other associated symptoms no treatment prior to coming here lightheadedness and weakness worse with standing. Her blood sugar read high this morning on her home machine. Past Medical History  Diagnosis Date  . Gastroparesis   . Anemia   . IBS (irritable bowel syndrome)   . Nausea   . Hx: UTI (urinary tract infection)   . Fibroids     leiomyoma 8/04 per [name]  . Fibromyalgia   . Diabetes mellitus   . Hypertension   . Chronic kidney disease   . Allergy   . Blood transfusion without reported diagnosis   . Infertility     eval at [facility]   Past Surgical History  Procedure Laterality Date  . Eye surgery      bilateral, vitrectomies  . Fibroid tumors removed    . Foot surgery      bilateral   Family History  Problem Relation Age of Onset  . Colon polyps Mother   . Diabetes Mother   . Anesthesia problems Neg Hx   . Hypotension Neg Hx   . Malignant hyperthermia Neg Hx   . Pseudochol deficiency Neg Hx   . Heart murmur Father   . Hypertension Father   . Diabetes Sister   . Kidney failure Sister   . Diabetes Brother   . Retinal degeneration Brother   . Heart murmur Son   . Stroke Paternal Grandmother   . Diabetes Sister    History  Substance Use Topics  . Smoking status: Never Smoker   . Smokeless tobacco: Never Used  . Alcohol Use: No   OB History    Gravida Para Term Preterm AB TAB SAB Ectopic Multiple Living   2 1   1  1   1      Review of Systems   Gastrointestinal: Positive for nausea.  Endocrine: Positive for polydipsia.  Allergic/Immunologic: Positive for immunocompromised state.       Diabetic  Neurological: Positive for weakness.  All other systems reviewed and are negative.     Allergies  Ibuprofen and Dilaudid  Home Medications   Prior to Admission medications   Medication Sig Start Date End Date Taking? Authorizing Provider  atorvastatin (LIPITOR) 10 MG tablet Take 1 tablet (10 mg total) by mouth daily. 04/30/15   Irene Pap, NP  b complex vitamins tablet Take 1 tablet by mouth daily. Patient not taking: Reported on 04/17/2015 03/07/15   Irene Pap, NP  calcitRIOL (ROCALTROL) 0.25 MCG capsule Take 0.5 mcg by mouth at bedtime.     Historical Provider, MD  Cholecalciferol (CVS VIT D 5000 HIGH-POTENCY) 5000 UNITS capsule Take 1 capsule (5,000 Units total) by mouth daily. 03/07/15   Irene Pap, NP  cyclobenzaprine (FLEXERIL) 10 MG tablet Take 1 tablet (10 mg total) by mouth 3 (three) times daily as needed for muscle spasms. Patient not taking: Reported on 04/17/2015 03/18/14   Calvert Cantor, MD  eletriptan (RELPAX) 40 MG tablet Take 1 tablet (40 mg total) by mouth once. Repeat once, in  2h, if still have headache. Do not take more than 2 tablets in 24h. 09/23/14 04/17/15  Irene Pap, NP  epoetin alfa (EPOGEN,PROCRIT) 28413 UNIT/ML injection 30,000 Units every 14 (fourteen) days.     Historical Provider, MD  ferumoxytol Shirlean Kelly) 510 MG/17ML SOLN injection Inject 1,020 mg into the vein once.    Historical Provider, MD  furosemide (LASIX) 80 MG tablet Take 1 tablet (80 mg total) by mouth 2 (two) times daily. Patient taking differently: Take 160 mg by mouth daily.  04/12/15   Irene Pap, NP  gabapentin (NEURONTIN) 300 MG capsule Take 1 capsule (300 mg total) by mouth 2 (two) times daily as needed. Patient taking differently: Take 300 mg by mouth 2 (two) times daily as needed (pain).  04/12/15   Irene Pap, NP   insulin detemir (LEVEMIR) 100 UNIT/ML injection Inject 0.19 mLs (19 Units total) into the skin daily. Inject at lunch time. 04/30/15   Irene Pap, NP  insulin lispro (HUMALOG) 100 UNIT/ML injection Inject 0.1 mLs (10 Units total) into the skin 3 (three) times daily before meals. 04/30/15   Irene Pap, NP  ketorolac (ACULAR) 0.5 % ophthalmic solution PLACE 1 DROP INTO THE LEFT EYE 4 TIMES DAILY FOR 10 DAYS. 05/09/15   Historical Provider, MD  lisinopril (PRINIVIL,ZESTRIL) 20 MG tablet Take 1 tablet (20 mg total) by mouth daily. Patient taking differently: Take 40 mg by mouth daily.  04/12/15   Irene Pap, NP  LORazepam (ATIVAN) 0.5 MG tablet Take 1 tablet (0.5 mg total) by mouth daily as needed for anxiety. 04/30/15   Irene Pap, NP  Polyethylene Glycol 1000 POWD Mix 1 heaping TBSP in 4 oz water PO. Repeat in 1 hr if no BM, up to 5 doses in 24 hours. 04/07/15   Irene Pap, NP   BP 133/91 mmHg  Pulse 102  Temp(Src) 98.1 F (36.7 C) (Oral)  Resp 18  Ht 5\' 9"  (1.753 m)  Wt 233 lb (105.688 kg)  BMI 34.39 kg/m2  SpO2 99%  LMP 06/03/2015 Physical Exam  Constitutional: [name] oriented to person, place, and time. She appears well-developed and well-nourished. No distress.  HENT:  Head: Normocephalic and atraumatic.  Mucous members dry  Eyes: Conjunctivae are normal. Pupils are equal, round, and reactive to light.  Neck: Neck supple. No tracheal deviation present. No thyromegaly present.  Cardiovascular: Normal rate and regular rhythm.   No murmur heard. Pulmonary/Chest: Effort normal and breath sounds normal.  Abdominal: Soft. Bowel sounds are normal. She exhibits no distension. There is no tenderness.  Musculoskeletal: Normal range of motion. She exhibits no edema or tenderness.  Neurological: [name] alert and oriented to person, place, and time. No cranial nerve deficit. Coordination normal.  Moves all extremities well gait normal, lightheaded on standing.  Skin: Skin is warm  and dry. No rash noted.  Psychiatric: She has a normal mood and affect.  Nursing note and vitals reviewed.   ED Course  Procedures (including critical care time) Labs Review Labs Reviewed  CBG MONITORING, ED - Abnormal; Notable for the following:    Glucose-Capillary 523 (*)    All other components within normal limits    Imaging Review No results found.   EKG Interpretation   Date/Time:  Monday June 23 2015 13:46:12 EDT Ventricular Rate:  90 PR Interval:  138 QRS Duration: 82 QT Interval:  360 QTC Calculation: 440 R Axis:   53 Text Interpretation:  Normal sinus rhythm  Normal ECG No significant change  since last tracing Confirmed by Ellenora Talton  MD, Fares [place] [phone]) on 06/23/2015  1:59:53 PM     3:40 PM feels much improved after treatment with intravenous insulin, intravenous saline. Headache and nausea have resolved after treatment with intravenous Reglan Results for orders placed or performed during the hospital encounter of 06/23/15  CBC  Result Value Ref Range   WBC 6.3 4.0 - 10.5 K/uL   RBC 4.37 3.87 - 5.11 MIL/uL   Hemoglobin 12.4 12.0 - 15.0 g/dL   HCT 38.8 36.0 - 46.0 %   MCV 88.8 78.0 - 100.0 fL   MCH 28.4 26.0 - 34.0 pg   MCHC 32.0 30.0 - 36.0 g/dL   RDW 14.0 11.5 - 15.5 %   Platelets 293 150 - 400 K/uL  Comprehensive metabolic panel  Result Value Ref Range   Sodium 127 (L) 135 - 145 mmol/L   Potassium 4.6 3.5 - 5.1 mmol/L   Chloride 93 (L) 101 - 111 mmol/L   CO2 23 22 - 32 mmol/L   Glucose, Bld 524 (H) 65 - 99 mg/dL   BUN 51 (H) 6 - 20 mg/dL   Creatinine, Ser 4.13 (H) 0.44 - 1.00 mg/dL   Calcium 8.9 8.9 - 10.3 mg/dL   Total Protein 7.6 6.5 - 8.1 g/dL   Albumin 3.4 (L) 3.5 - 5.0 g/dL   AST 18 15 - 41 U/L   ALT 18 14 - 54 U/L   Alkaline Phosphatase 90 38 - 126 U/L   Total Bilirubin 0.6 0.3 - 1.2 mg/dL   GFR calc non Af Amer 12 (L) >60 mL/min   GFR calc Af Amer 14 (L) >60 mL/min   Anion gap 11 5 - 15  Pregnancy, urine (if pre-menopausal female)   Result Value Ref Range   Preg Test, Ur NEGATIVE NEGATIVE  Urinalysis, Routine w reflex microscopic  Result Value Ref Range   Color, Urine YELLOW YELLOW   APPearance CLEAR CLEAR   Specific Gravity, Urine 1.021 1.005 - 1.030   pH 5.5 5.0 - 8.0   Glucose, UA >1000 (A) NEGATIVE mg/dL   Hgb urine dipstick NEGATIVE NEGATIVE   Bilirubin Urine NEGATIVE NEGATIVE   Ketones, ur NEGATIVE NEGATIVE mg/dL   Protein, ur 100 (A) NEGATIVE mg/dL   Urobilinogen, UA 0.2 0.0 - 1.0 mg/dL   Nitrite NEGATIVE NEGATIVE   Leukocytes, UA NEGATIVE NEGATIVE  Troponin I  Result Value Ref Range   Troponin I <0.03 <0.031 ng/mL  Urine microscopic-add on  Result Value Ref Range   Squamous Epithelial / LPF FEW (A) RARE   WBC, UA 0-2 <3 WBC/hpf   RBC / HPF 3-6 <3 RBC/hpf   Bacteria, UA FEW (A) RARE   Urine-Other MUCOUS PRESENT   POC CBG, ED  Result Value Ref Range   Glucose-Capillary 523 (H) 65 - 99 mg/dL   Comment 1 Notify RN   CBG monitoring, ED  Result Value Ref Range   Glucose-Capillary 354 (H) 65 - 99 mg/dL  I-Stat venous blood gas, ED  Result Value Ref Range   pH, Ven 7.348 (H) 7.250 - 7.300   pCO2, Ven 45.1 45.0 - 50.0 mmHg   pO2, Ven 46.0 (H) 30.0 - 45.0 mmHg   Bicarbonate 24.8 (H) 20.0 - 24.0 mEq/L   TCO2 26 0 - 100 mmol/L   O2 Saturation 79.0 %   Acid-base deficit 1.0 0.0 - 2.0 mmol/L   Sample type VENOUS   CBG monitoring, ED  Result Value  Ref Range   Glucose-Capillary 316 (H) 65 - 99 mg/dL   No results found.  MDM  Patient signed out to Dr. Christy Gentles 3:55 PM Final diagnoses:  None   Diagnoses #1 hyperglycemia #2 renal insufficiency #3 hyponatremia #4 headache #7medication noncompliance     Orlie Dakin, MD 06/23/15 1559

## 2015-06-23 NOTE — ED Notes (Signed)
Pt c/o elevated BS "high" yesterday then in the 400s-c/o dizziness, fatigue, HA-started yesterday-BS 454 this am

## 2015-06-23 NOTE — ED Provider Notes (Signed)
Pt improved [name] ambulatory She would like to be discharged Her labs are improved We discussed strict ER return precautions BP 156/94 mmHg  Pulse 86  Temp(Src) 98.1 F (36.7 C) (Oral)  Resp 13  Ht 5\' 9"  (1.753 m)  Wt 233 lb (105.688 kg)  BMI 34.39 kg/m2  SpO2 95%  LMP 06/03/2015   Ripley Fraise, MD 06/23/15 1714

## 2015-06-23 NOTE — ED Notes (Signed)
D/c home with ride- no new Rx given

## 2015-06-23 NOTE — ED Provider Notes (Signed)
Plan is to recheck BMP after two liters If improved and patient well she can be discharged home   Ripley Fraise, MD 06/23/15 1534

## 2015-06-23 NOTE — Discharge Instructions (Signed)

## 2015-06-24 ENCOUNTER — Ambulatory Visit: Payer: Managed Care, Other (non HMO) | Admitting: Neurology

## 2015-07-01 ENCOUNTER — Encounter (HOSPITAL_COMMUNITY): Payer: Managed Care, Other (non HMO)

## 2015-07-04 ENCOUNTER — Encounter (HOSPITAL_COMMUNITY)
Admission: RE | Admit: 2015-07-04 | Discharge: 2015-07-04 | Disposition: A | Discharge status: Home / Self Care | Payer: Managed Care, Other (non HMO) | Source: Ambulatory Visit | Attending: Nephrology | Admitting: Nephrology

## 2015-07-04 DIAGNOSIS — N182 Chronic kidney disease, stage 2 (mild): Secondary | ICD-10-CM | POA: Diagnosis present

## 2015-07-04 LAB — IRON AND TIBC
IRON: 59 ug/dL (ref 28–170)
Saturation Ratios: 26 % (ref 10.4–31.8)
TIBC: 231 ug/dL — AB (ref 250–450)
UIBC: 172 ug/dL

## 2015-07-04 LAB — POCT HEMOGLOBIN-HEMACUE: HEMOGLOBIN: 12 g/dL (ref 12.0–15.0)

## 2015-07-04 LAB — FERRITIN: Ferritin: 494 ng/mL — ABNORMAL HIGH (ref 11–307)

## 2015-07-04 MED ORDER — EPOETIN ALFA 40000 UNIT/ML IJ SOLN: 30000.0000 [IU] | INTRAMUSCULAR | Status: DC

## 2015-07-17 ENCOUNTER — Other Ambulatory Visit (HOSPITAL_COMMUNITY): Payer: Self-pay

## 2015-07-17 ENCOUNTER — Encounter: Payer: Self-pay | Admitting: Nurse Practitioner

## 2015-07-17 ENCOUNTER — Ambulatory Visit (INDEPENDENT_AMBULATORY_CARE_PROVIDER_SITE_OTHER): Payer: Managed Care, Other (non HMO) | Admitting: Nurse Practitioner

## 2015-07-17 VITALS — BP 140/90 | HR 83 | Temp 98.4°F | Resp 18 | Ht 69.0 in | Wt 233.0 lb

## 2015-07-17 DIAGNOSIS — E539 Vitamin B deficiency, unspecified: Secondary | ICD-10-CM

## 2015-07-17 DIAGNOSIS — E1310 Other specified diabetes mellitus with ketoacidosis without coma: Secondary | ICD-10-CM

## 2015-07-17 DIAGNOSIS — E559 Vitamin D deficiency, unspecified: Secondary | ICD-10-CM | POA: Diagnosis not present

## 2015-07-17 DIAGNOSIS — G629 Polyneuropathy, unspecified: Secondary | ICD-10-CM

## 2015-07-17 DIAGNOSIS — E118 Type 2 diabetes mellitus with unspecified complications: Secondary | ICD-10-CM

## 2015-07-17 DIAGNOSIS — R519 Headache, unspecified: Secondary | ICD-10-CM

## 2015-07-17 DIAGNOSIS — I10 Essential (primary) hypertension: Secondary | ICD-10-CM

## 2015-07-17 DIAGNOSIS — R51 Headache: Secondary | ICD-10-CM

## 2015-07-17 MED ORDER — ELETRIPTAN HYDROBROMIDE 40 MG PO TABS: 40.0000 mg | ORAL_TABLET | Freq: Once | ORAL | Status: DC

## 2015-07-17 MED ORDER — B COMPLEX PO TABS: 1.0000 | ORAL_TABLET | Freq: Every day | ORAL | Status: DC

## 2015-07-17 MED ORDER — AMLODIPINE BESYLATE 5 MG PO TABS: 5.0000 mg | ORAL_TABLET | Freq: Every day | ORAL | Status: DC

## 2015-07-17 MED ORDER — GLUCOSE BLOOD VI STRP: ORAL_STRIP | Status: DC

## 2015-07-17 MED ORDER — CHOLECALCIFEROL 125 MCG (5000 UT) PO CAPS: 5000.0000 [IU] | ORAL_CAPSULE | Freq: Every day | ORAL | Status: DC

## 2015-07-17 MED ORDER — LISINOPRIL 40 MG PO TABS: 40.0000 mg | ORAL_TABLET | Freq: Every day | ORAL | Status: DC

## 2015-07-17 MED ORDER — GABAPENTIN 300 MG PO CAPS: 300.0000 mg | ORAL_CAPSULE | Freq: Two times a day (BID) | ORAL | Status: DC | PRN

## 2015-07-17 NOTE — Assessment & Plan Note (Signed)
Last A1c 11.2 Increase levemir to 22 u qd Counseled on NOT skipping doses of humalog F/u w/Dr Chalmers Cater. Discuss insulin pump. Discussed importance of BS & BP control to preserve kidney function.

## 2015-07-17 NOTE — Assessment & Plan Note (Signed)
Added 5 mg amlodopine.

## 2015-07-17 NOTE — Progress Notes (Signed)
Pre visit review using our clinic review tool, if applicable. No additional management support is needed unless otherwise documented below in the visit note. 

## 2015-07-17 NOTE — Patient Instructions (Addendum)
Please make appointment with Dr Chalmers Cater. Discuss possibility of insulin pump. Contact Dr Deterding's office for calcitrol refill & follow up appointment.  Re-start vitamin D & B complex. Prescriptions sent.  Start amlodopine. Blood pressure goal is 135/80. Call office if not controlled.  Continue lisinopril-I changed tablet to 40 mg daily, so take 1 tablet instead of 2.  Start atorvastatin for cholesterol.  Take miralax every few days to have bowel movement at least every 3 days.  Increase levemir to 22 units daily.  DO NOT skip novolog dose-take 3 times daily.  My office will call with lab results.  It has been a pleasure to partner with you in your healthcare! I will miss you!

## 2015-07-17 NOTE — Progress Notes (Signed)
Subjective:     Heather [name] is a 44 y.o. female presents for f/u of DM2 with complications: CKD stage 4, proteinuria, anemia, hyperparathyroidism, HTN, gastric paresis & chronic constipation, severe peripheral neuropathy w/Hx foot ulcers, obesity. [name] accompanied by her husband today who is supportive. She has had 2 ED visits w/1 overnight admission due to hyperglycemia since last OV: 04/22/15-04/23/15 [facility] & 06/23/15 [facility].  Reviewed [facility] note: CC-L chest pain & L facial numbness. Work up included serial troponins-nml; Lexiscan-nml; carotid dopplers-nml; MRI brain-nml; BS was 532 on admission. She was fluid rescussitated & BS stabilized-154 on d/c. [place] note reviewed: CBG over 523 & hyponatremic. She was fluid rescussitated & BS stabilized to 171 prior to d/c.  Reiewed last OV note from [place] [facility] dated 04/14/15. No changes to treatment plan-recommend tight control of DM & HTN, 40 lb weight loss.   [name] is skipping doses of humalog at lunch time. I stressed importance of tight control of BS & BP to preserve kidney function. We discussed insulin pump. I think she would be good candidate as she isn't able to troubleshoot her sugars in spite of close communication with me through texts, written & oral information. She skips doses of insulin, doesn't check sugars regularly, doesn't adhere to diet instructions, appears somewhat complacent or lackadaisical about her health, but perhaps there is problem processing information. She seems confused about symptoms although she has had DM for 20 yrs-she expressed she worries that she is taking too much insulin when she feels dizzy, although BS are reported at 350-400. I reinforced that dizzyness can occur when sugar is too low or too high & she must check BS 1 hr after taking humalog to see if it is coming down. She expresses desire to have better health-she is encouraged by her sister's recent progress with insulin pump & is hopeful she will  be candidate & have positive results. She agrees to follow up with dr Chalmers Cater.   Constipation continues to be problem-has BM weekly. [name] not adhering to bowel regimen we discussed. I stressed importance of using miralax to have BM at least every 2-3 days.  BP not controlled today. Will add norvasc.  She brings in 5 page FMLA paperwork.   The following portions of the patient's history were reviewed and updated as appropriate: allergies, current medications, past family history, past medical history, past social history, past surgical history and problem list.  Review of Systems Pertinent items are noted in HPI.    Objective:    LMP 06/03/2015 BP 142/92 mmHg  Pulse 83  Temp(Src) 98.4 F (36.9 C) (Temporal)  Resp 18  Ht 5\' 9"  (1.753 m)  Wt 233 lb (105.688 kg)  BMI 34.39 kg/m2  SpO2 98%  LMP 06/03/2015 General appearance: cooperative, appears stated age and fatigued Eyes: negative findings: lids and lashes normal and conjunctivae and sclerae normal Lungs: clear to auscultation bilaterally Heart: regular rate and rhythm, S1, S2 normal, no murmur, click, rub or gallop Abdomen: soft, non-tender; bowel sounds normal; no masses,  no organomegaly and obese Extremities: edema none and pronation of feet Skin: Skin color, texture, turgor normal. No rashes or lesions    Assessment:Plan     1. Type 2 diabetes mellitus with complication Increase levemir to 22 u daily Do not skip doses of humalog, take tid F/u w/Dr Chalmers Cater. Discuss insulin pump Reviewed symptoms of hyperglycemia-including dizziness Discussed management of BS when over 350-hydrate & take insulin, check in 1 hour -  Hemoglobin A1c - glucose blood (ONETOUCH VERIO) test strip; Test 4 times daily.  Dispense: 100 each; Refill: 12  2. Essential hypertension start - amLODipine (NORVASC) 5 MG tablet; Take 1 tablet (5 mg total) by mouth daily.  Dispense: 90 tablet; Refill: 1 continue - lisinopril (PRINIVIL,ZESTRIL) 40 MG  tablet; Take 1 tablet (40 mg total) by mouth daily.  Dispense: 90 tablet; Refill: 1 Continue lasix as prescribed by Dr Deterding  3. Peripheral neuropathy continue - gabapentin (NEURONTIN) 300 MG capsule; Take 1 capsule (300 mg total) by mouth 2 (two) times daily as needed.  Dispense: 60 capsule; Refill: 3  4. Nonintractable episodic headache, unspecified headache type Continue PRN - eletriptan (RELPAX) 40 MG tablet; Take 1 tablet (40 mg total) by mouth once. Repeat once, in 2h, if still have headache. Do not take more than 2 tablets in 24h.  Dispense: 10 tablet; Refill: 2  5. Vitamin B deficiency Re-start - b complex vitamins tablet; Take 1 tablet by mouth daily.  Dispense: 90 tablet; Refill: 1  6. Vitamin D deficiency Explained need for D3, pt had stopped taking. Re-start - Vit D  25 hydroxy (rtn osteoporosis monitoring) - Cholecalciferol (CVS VIT D 5000 HIGH-POTENCY) 5000 UNITS capsule; Take 1 capsule (5,000 Units total) by mouth daily.  Dispense: 90 capsule; Refill: 1 Call dr deterding for refill on cholecalciferol  See pt instructions F/u 3 mos

## 2015-07-18 ENCOUNTER — Encounter (HOSPITAL_COMMUNITY)
Admission: RE | Admit: 2015-07-18 | Discharge: 2015-07-18 | Disposition: A | Discharge status: Home / Self Care | Payer: Managed Care, Other (non HMO) | Source: Ambulatory Visit | Attending: Nephrology | Admitting: Nephrology

## 2015-07-18 DIAGNOSIS — N182 Chronic kidney disease, stage 2 (mild): Secondary | ICD-10-CM

## 2015-07-18 LAB — VITAMIN D 25 HYDROXY (VIT D DEFICIENCY, FRACTURES): VITD: 5.26 ng/mL — AB (ref 30.00–100.00)

## 2015-07-18 LAB — POCT HEMOGLOBIN-HEMACUE: HEMOGLOBIN: 10.9 g/dL — AB (ref 12.0–15.0)

## 2015-07-18 LAB — HEMOGLOBIN A1C: HEMOGLOBIN A1C: 12.9 % — AB (ref 4.6–6.5)

## 2015-07-18 MED ORDER — EPOETIN ALFA 40000 UNIT/ML IJ SOLN: 30000.0000 [IU] | INTRAMUSCULAR | Status: DC

## 2015-07-18 MED ORDER — EPOETIN ALFA 20000 UNIT/ML IJ SOLN
INTRAMUSCULAR | Status: AC
  Administered 2015-07-18: 20000 [IU]
  Filled 2015-07-18: qty 1

## 2015-07-18 MED ORDER — EPOETIN ALFA 10000 UNIT/ML IJ SOLN
INTRAMUSCULAR | Status: AC
  Administered 2015-07-18: 10000 [IU]
  Filled 2015-07-18: qty 1

## 2015-07-21 ENCOUNTER — Telehealth: Payer: Self-pay | Admitting: Nurse Practitioner

## 2015-07-21 NOTE — Telephone Encounter (Signed)
Pt left FMLA papers for leave dates of 04/30/15-05/07/15 and 6/2-05/30/15.  I think this was when she had cataract surgery. Her eye doctor will need to fill out this paperwork.  I left message offering to fax to eye Dr office if she will give fax #. She will need to call eye dr to let them know what her needs are.  Forms are at front desk.

## 2015-07-21 NOTE — Telephone Encounter (Signed)
pls call pt: Advise A1c is worse 12.9. Please increase insulin as directed at OV, & see Dr Chalmers Cater. Take 5000 iu d3 daily as prescribed.

## 2015-07-22 NOTE — Telephone Encounter (Signed)
Pt aware.  She has appointment at Dr. Chalmers Cater 08/14/15.

## 2015-07-23 ENCOUNTER — Encounter (HOSPITAL_COMMUNITY)
Admission: RE | Admit: 2015-07-23 | Discharge: 2015-07-23 | Disposition: A | Discharge status: Home / Self Care | Payer: Managed Care, Other (non HMO) | Source: Ambulatory Visit | Attending: Nephrology | Admitting: Nephrology

## 2015-07-23 DIAGNOSIS — N182 Chronic kidney disease, stage 2 (mild): Secondary | ICD-10-CM | POA: Diagnosis not present

## 2015-07-23 MED ORDER — SODIUM CHLORIDE 0.9 % IV SOLN
510.0000 mg | Freq: Once | INTRAVENOUS | Status: AC
  Administered 2015-07-23: 510 mg via INTRAVENOUS
  Filled 2015-07-23: qty 17

## 2015-07-23 MED ORDER — EPOETIN ALFA 40000 UNIT/ML IJ SOLN: 30000.0000 [IU] | INTRAMUSCULAR | Status: DC

## 2015-08-01 ENCOUNTER — Encounter (HOSPITAL_COMMUNITY)
Admission: RE | Admit: 2015-08-01 | Discharge: 2015-08-01 | Disposition: A | Discharge status: Home / Self Care | Payer: Managed Care, Other (non HMO) | Source: Ambulatory Visit | Attending: Nephrology | Admitting: Nephrology

## 2015-08-01 DIAGNOSIS — N182 Chronic kidney disease, stage 2 (mild): Secondary | ICD-10-CM | POA: Diagnosis present

## 2015-08-01 LAB — IRON AND TIBC
Iron: 83 ug/dL (ref 28–170)
SATURATION RATIOS: 35 % — AB (ref 10.4–31.8)
TIBC: 237 ug/dL — ABNORMAL LOW (ref 250–450)
UIBC: 154 ug/dL

## 2015-08-01 LAB — POCT HEMOGLOBIN-HEMACUE: Hemoglobin: 11.7 g/dL — ABNORMAL LOW (ref 12.0–15.0)

## 2015-08-01 LAB — FERRITIN: FERRITIN: 780 ng/mL — AB (ref 11–307)

## 2015-08-01 MED ORDER — EPOETIN ALFA 20000 UNIT/ML IJ SOLN
INTRAMUSCULAR | Status: AC
  Administered 2015-08-01: 20000 [IU] via SUBCUTANEOUS
  Filled 2015-08-01: qty 1

## 2015-08-01 MED ORDER — EPOETIN ALFA 10000 UNIT/ML IJ SOLN
INTRAMUSCULAR | Status: AC
  Administered 2015-08-01: 10000 [IU] via SUBCUTANEOUS
  Filled 2015-08-01: qty 1

## 2015-08-01 MED ORDER — EPOETIN ALFA 40000 UNIT/ML IJ SOLN: 30000.0000 [IU] | INTRAMUSCULAR | Status: DC

## 2015-08-11 ENCOUNTER — Emergency Department (HOSPITAL_COMMUNITY)
Admission: EM | Admit: 2015-08-11 | Discharge: 2015-08-11 | Disposition: A | Discharge status: Home / Self Care | Payer: Managed Care, Other (non HMO) | Attending: Emergency Medicine | Admitting: Emergency Medicine

## 2015-08-11 ENCOUNTER — Encounter (HOSPITAL_COMMUNITY): Payer: Self-pay

## 2015-08-11 DIAGNOSIS — E119 Type 2 diabetes mellitus without complications: Secondary | ICD-10-CM | POA: Diagnosis not present

## 2015-08-11 DIAGNOSIS — Z87440 Personal history of urinary (tract) infections: Secondary | ICD-10-CM | POA: Insufficient documentation

## 2015-08-11 DIAGNOSIS — N189 Chronic kidney disease, unspecified: Secondary | ICD-10-CM | POA: Insufficient documentation

## 2015-08-11 DIAGNOSIS — Z86018 Personal history of other benign neoplasm: Secondary | ICD-10-CM | POA: Diagnosis not present

## 2015-08-11 DIAGNOSIS — R112 Nausea with vomiting, unspecified: Secondary | ICD-10-CM

## 2015-08-11 DIAGNOSIS — Z794 Long term (current) use of insulin: Secondary | ICD-10-CM | POA: Diagnosis not present

## 2015-08-11 DIAGNOSIS — Z862 Personal history of diseases of the blood and blood-forming organs and certain disorders involving the immune mechanism: Secondary | ICD-10-CM | POA: Insufficient documentation

## 2015-08-11 DIAGNOSIS — R109 Unspecified abdominal pain: Secondary | ICD-10-CM | POA: Diagnosis present

## 2015-08-11 DIAGNOSIS — K859 Acute pancreatitis, unspecified: Secondary | ICD-10-CM | POA: Diagnosis not present

## 2015-08-11 DIAGNOSIS — R197 Diarrhea, unspecified: Secondary | ICD-10-CM

## 2015-08-11 DIAGNOSIS — Z3202 Encounter for pregnancy test, result negative: Secondary | ICD-10-CM | POA: Insufficient documentation

## 2015-08-11 DIAGNOSIS — Z79899 Other long term (current) drug therapy: Secondary | ICD-10-CM | POA: Insufficient documentation

## 2015-08-11 DIAGNOSIS — I129 Hypertensive chronic kidney disease with stage 1 through stage 4 chronic kidney disease, or unspecified chronic kidney disease: Secondary | ICD-10-CM | POA: Insufficient documentation

## 2015-08-11 LAB — URINE MICROSCOPIC-ADD ON

## 2015-08-11 LAB — URINALYSIS, ROUTINE W REFLEX MICROSCOPIC
Bilirubin Urine: NEGATIVE
Glucose, UA: >1000 mg/dL — AB
Ketones, ur: NEGATIVE mg/dL
Leukocytes, UA: NEGATIVE
Nitrite: NEGATIVE
Protein, ur: 100 mg/dL — AB
Specific Gravity, Urine: 1.02 (ref 1.005–1.030)
Urobilinogen, UA: 0.2 mg/dL (ref 0.0–1.0)
pH: 6 (ref 5.0–8.0)

## 2015-08-11 LAB — COMPREHENSIVE METABOLIC PANEL
ALT: 17 U/L (ref 14–54)
AST: 14 U/L — ABNORMAL LOW (ref 15–41)
Albumin: 3.5 g/dL (ref 3.5–5.0)
Alkaline Phosphatase: 97 U/L (ref 38–126)
Anion gap: 8 (ref 5–15)
BUN: 44 mg/dL — ABNORMAL HIGH (ref 6–20)
CO2: 24 mmol/L (ref 22–32)
Calcium: 8.9 mg/dL (ref 8.9–10.3)
Chloride: 99 mmol/L — ABNORMAL LOW (ref 101–111)
Creatinine, Ser: 3.22 mg/dL — ABNORMAL HIGH (ref 0.44–1.00)
GFR calc Af Amer: 19 mL/min — ABNORMAL LOW (ref >60)
GFR calc non Af Amer: 16 mL/min — ABNORMAL LOW (ref >60)
Glucose, Bld: 495 mg/dL — ABNORMAL HIGH (ref 65–99)
Potassium: 4.4 mmol/L (ref 3.5–5.1)
Sodium: 131 mmol/L — ABNORMAL LOW (ref 135–145)
Total Bilirubin: 0.5 mg/dL (ref 0.3–1.2)
Total Protein: 7.5 g/dL (ref 6.5–8.1)

## 2015-08-11 LAB — CBC
HCT: 40 % (ref 36.0–46.0)
Hemoglobin: 12.7 g/dL (ref 12.0–15.0)
MCH: 28.7 pg (ref 26.0–34.0)
MCHC: 31.8 g/dL (ref 30.0–36.0)
MCV: 90.5 fL (ref 78.0–100.0)
Platelets: 238 10*3/uL (ref 150–400)
RBC: 4.42 MIL/uL (ref 3.87–5.11)
RDW: 15.1 % (ref 11.5–15.5)
WBC: 7.2 10*3/uL (ref 4.0–10.5)

## 2015-08-11 LAB — I-STAT BETA HCG BLOOD, ED (MC, WL, AP ONLY): I-stat hCG, quantitative: <5 m[IU]/mL (ref <5)

## 2015-08-11 LAB — LIPASE, BLOOD: Lipase: 141 U/L — ABNORMAL HIGH (ref 22–51)

## 2015-08-11 LAB — CBG MONITORING, ED
Glucose-Capillary: 435 mg/dL — ABNORMAL HIGH (ref 65–99)
Glucose-Capillary: 339 mg/dL — ABNORMAL HIGH (ref 65–99)

## 2015-08-11 MED ORDER — LOPERAMIDE HCL 2 MG PO CAPS
4.0000 mg | ORAL_CAPSULE | Freq: Once | ORAL | Status: AC
  Administered 2015-08-11: 4 mg via ORAL
  Filled 2015-08-11: qty 2

## 2015-08-11 MED ORDER — SODIUM CHLORIDE 0.9 % IV BOLUS (SEPSIS)
1000.0000 mL | Freq: Once | INTRAVENOUS | Status: AC
  Administered 2015-08-11: 1000 mL via INTRAVENOUS

## 2015-08-11 MED ORDER — ONDANSETRON HCL 4 MG/2ML IJ SOLN
4.0000 mg | Freq: Once | INTRAMUSCULAR | Status: AC
  Administered 2015-08-11: 4 mg via INTRAVENOUS
  Filled 2015-08-11: qty 2

## 2015-08-11 MED ORDER — ONDANSETRON HCL 4 MG PO TABS: 4.0000 mg | ORAL_TABLET | Freq: Four times a day (QID) | ORAL | Status: DC

## 2015-08-11 MED ORDER — INSULIN ASPART 100 UNIT/ML ~~LOC~~ SOLN
10.0000 [IU] | Freq: Once | SUBCUTANEOUS | Status: AC
  Administered 2015-08-11: 10 [IU] via INTRAVENOUS
  Filled 2015-08-11: qty 1

## 2015-08-11 MED ORDER — OXYCODONE-ACETAMINOPHEN 5-325 MG PO TABS: 1.0000 | ORAL_TABLET | ORAL | Status: DC | PRN

## 2015-08-11 MED ORDER — MORPHINE SULFATE 4 MG/ML IJ SOLN
4.0000 mg | Freq: Once | INTRAMUSCULAR | Status: AC
  Administered 2015-08-11: 4 mg via INTRAVENOUS
  Filled 2015-08-11: qty 1

## 2015-08-11 NOTE — ED Notes (Signed)
Pt with diarrhea and abdominal pain x 1 week.  Started vomiting today.  No fever. No change in urination.  No recent antibiotics

## 2015-08-15 ENCOUNTER — Encounter (HOSPITAL_COMMUNITY)
Admission: RE | Admit: 2015-08-15 | Discharge: 2015-08-15 | Disposition: A | Discharge status: Home / Self Care | Payer: Managed Care, Other (non HMO) | Source: Ambulatory Visit | Attending: Nephrology | Admitting: Nephrology

## 2015-08-15 DIAGNOSIS — N182 Chronic kidney disease, stage 2 (mild): Secondary | ICD-10-CM | POA: Diagnosis not present

## 2015-08-15 LAB — POCT HEMOGLOBIN-HEMACUE: HEMOGLOBIN: 11.7 g/dL — AB (ref 12.0–15.0)

## 2015-08-15 MED ORDER — EPOETIN ALFA 10000 UNIT/ML IJ SOLN
INTRAMUSCULAR | Status: AC
  Administered 2015-08-15: 10000 [IU]
  Filled 2015-08-15: qty 1

## 2015-08-15 MED ORDER — EPOETIN ALFA 40000 UNIT/ML IJ SOLN: 30000.0000 [IU] | INTRAMUSCULAR | Status: DC

## 2015-08-15 MED ORDER — EPOETIN ALFA 20000 UNIT/ML IJ SOLN
INTRAMUSCULAR | Status: AC
  Administered 2015-08-15: 20000 [IU]
  Filled 2015-08-15: qty 1

## 2015-08-21 NOTE — ED Provider Notes (Signed)
CSN: [id number]     Arrival date & time 08/11/15  1100 History   First MD Initiated Contact with Patient 08/11/15 1134     Chief Complaint  Patient presents with  . Abdominal Pain  . Diarrhea     (Consider location/radiation/quality/duration/timing/severity/associated sxs/prior Treatment) HPI   44 year old female with abdominal pain. Gradual onset about a week ago. Has waxed and waned but not completely gone away. Diffuse and crampy. Perhaps somewhat worse in her upper abdomen. Doesn't particularly lateralize. Associated with diarrhea. Nausea and vomiting starting today. No blood in her emesis or stool. No fevers or chills. No urinary complaints. No sick contacts.  Past Medical History  Diagnosis Date  . Gastroparesis   . Anemia   . IBS (irritable bowel syndrome)   . Nausea   . Hx: UTI (urinary tract infection)   . Fibroids     leiomyoma 8/04 per [name]  . Fibromyalgia   . Diabetes mellitus   . Hypertension   . Chronic kidney disease   . Allergy   . Blood transfusion without reported diagnosis   . Infertility     eval at [facility]   Past Surgical History  Procedure Laterality Date  . Eye surgery      bilateral, vitrectomies  . Fibroid tumors removed    . Foot surgery      bilateral   Family History  Problem Relation Age of Onset  . Colon polyps Mother   . Diabetes Mother   . Anesthesia problems Neg Hx   . Hypotension Neg Hx   . Malignant hyperthermia Neg Hx   . Pseudochol deficiency Neg Hx   . Heart murmur Father   . Hypertension Father   . Diabetes Sister   . Kidney failure Sister   . Diabetes Brother   . Retinal degeneration Brother   . Heart murmur Son   . Stroke Paternal Grandmother   . Diabetes Sister    Social History  Substance Use Topics  . Smoking status: Never Smoker   . Smokeless tobacco: Never Used  . Alcohol Use: No   OB History    Gravida Para Term Preterm AB TAB SAB Ectopic Multiple Living   2 1   1  1   1      Review of Systems  All  systems reviewed and negative, other than as noted in HPI.'   Allergies  Ibuprofen and Dilaudid  Home Medications   Prior to Admission medications   Medication Sig Start Date End Date Taking? Authorizing Provider  calcitRIOL (ROCALTROL) 0.25 MCG capsule Take 0.5 mcg by mouth at bedtime.    Yes Historical Provider, MD  eletriptan (RELPAX) 40 MG tablet Take 1 tablet (40 mg total) by mouth once. Repeat once, in 2h, if still have headache. Do not take more than 2 tablets in 24h. 07/17/15 02/08/16 Yes Irene Pap, NP  epoetin alfa (EPOGEN,PROCRIT) 91478 UNIT/ML injection Inject 30,000 Units into the vein every 14 (fourteen) days.    Yes Historical Provider, MD  ferumoxytol Shirlean Kelly) 510 MG/17ML SOLN injection Inject 1,020 mg into the vein every 14 (fourteen) days.    Yes Historical Provider, MD  furosemide (LASIX) 80 MG tablet Take 1 tablet (80 mg total) by mouth 2 (two) times daily. Patient taking differently: Take 160 mg by mouth daily.  04/12/15  Yes Irene Pap, NP  gabapentin (NEURONTIN) 300 MG capsule Take 1 capsule (300 mg total) by mouth 2 (two) times daily as needed. Patient taking differently: Take 300  mg by mouth 2 (two) times daily as needed (for patin).  07/17/15  Yes Irene Pap, NP  glucose blood (ONETOUCH VERIO) test strip Test 4 times daily. 07/17/15  Yes Irene Pap, NP  insulin detemir (LEVEMIR) 100 UNIT/ML injection Inject 0.19 mLs (19 Units total) into the skin daily. Inject at lunch time. Patient taking differently: Inject 19 Units into the skin at bedtime.  04/30/15  Yes Irene Pap, NP  insulin lispro (HUMALOG) 100 UNIT/ML injection Inject 0.1 mLs (10 Units total) into the skin 3 (three) times daily before meals. 04/30/15  Yes Irene Pap, NP  lisinopril (PRINIVIL,ZESTRIL) 40 MG tablet Take 1 tablet (40 mg total) by mouth daily. 07/17/15  Yes Irene Pap, NP  amLODipine (NORVASC) 5 MG tablet Take 1 tablet (5 mg total) by mouth daily. 07/17/15   Irene Pap, NP   atorvastatin (LIPITOR) 10 MG tablet Take 1 tablet (10 mg total) by mouth daily. Patient not taking: Reported on 07/17/2015 04/30/15   Irene Pap, NP  b complex vitamins tablet Take 1 tablet by mouth daily. 07/17/15   Irene Pap, NP  Cholecalciferol ([employer] VIT D 5000 HIGH-POTENCY) 5000 UNITS capsule Take 1 capsule (5,000 Units total) by mouth daily. 07/17/15   Irene Pap, NP  LORazepam (ATIVAN) 0.5 MG tablet Take 1 tablet (0.5 mg total) by mouth daily as needed for anxiety. Patient not taking: Reported on 08/11/2015 04/30/15   Irene Pap, NP  ondansetron (ZOFRAN) 4 MG tablet Take 1 tablet (4 mg total) by mouth every 6 (six) hours. 08/11/15   Virgel Manifold, MD  oxyCODONE-acetaminophen (PERCOCET/ROXICET) 5-325 MG per tablet Take 1-2 tablets by mouth every 4 (four) hours as needed for severe pain. 08/11/15   Virgel Manifold, MD  Polyethylene Glycol 1000 POWD Mix 1 heaping TBSP in 4 oz water PO. Repeat in 1 hr if no BM, up to 5 doses in 24 hours. Patient not taking: Reported on 07/17/2015 04/07/15   Irene Pap, NP   BP 182/98 mmHg  Pulse 86  Temp(Src) 97.6 F (36.4 C) (Oral)  Resp 18  SpO2 100%  LMP 07/23/2015 Physical Exam  Constitutional: She appears well-developed and well-nourished. No distress.  Laying in bed. No acute distress. Obese.  HENT:  Head: Normocephalic and atraumatic.  Eyes: Conjunctivae are normal. Right eye exhibits no discharge. Left eye exhibits no discharge.  Neck: Neck supple.  Cardiovascular: Normal rate, regular rhythm and normal heart sounds.  Exam reveals no gallop and no friction rub.   No murmur heard. Pulmonary/Chest: Effort normal and breath sounds normal. No respiratory distress.  Abdominal: Soft. She exhibits no distension. There is tenderness. There is no rebound and no guarding.  Mild epigastric tenderness without rebound or guarding. No distention.  Musculoskeletal: She exhibits no edema or tenderness.  Neurological: [name] alert.  Skin: Skin is  warm and dry.  Psychiatric: She has a normal mood and affect. Her behavior is normal. Thought content normal.  Nursing note and vitals reviewed.   ED Course  Procedures (including critical care time) Labs Review Labs Reviewed  LIPASE, BLOOD - Abnormal; Notable for the following:    Lipase 141 (*)    All other components within normal limits  COMPREHENSIVE METABOLIC PANEL - Abnormal; Notable for the following:    Sodium 131 (*)    Chloride 99 (*)    Glucose, Bld 495 (*)    BUN 44 (*)    Creatinine, Ser 3.22 (*)  AST 14 (*)    GFR calc non Af Amer 16 (*)    GFR calc Af Amer 19 (*)    All other components within normal limits  URINALYSIS, ROUTINE W REFLEX MICROSCOPIC (NOT AT [facility]) - Abnormal; Notable for the following:    Glucose, UA >1000 (*)    Hgb urine dipstick SMALL (*)    Protein, ur 100 (*)    All other components within normal limits  URINE MICROSCOPIC-ADD ON - Abnormal; Notable for the following:    Squamous Epithelial / LPF FEW (*)    All other components within normal limits  CBG MONITORING, ED - Abnormal; Notable for the following:    Glucose-Capillary 435 (*)    All other components within normal limits  CBG MONITORING, ED - Abnormal; Notable for the following:    Glucose-Capillary 339 (*)    All other components within normal limits  CBC  I-STAT BETA HCG BLOOD, ED ([facility], [facility], [facility] ONLY)    Imaging Review No results found. I have personally reviewed and evaluated these images and lab results as part of my medical decision-making.   EKG Interpretation None      MDM   Final diagnoses:  Acute pancreatitis, unspecified pancreatitis type  Non-intractable vomiting with nausea, vomiting of unspecified type  Diarrhea    44 year old female with abdominal pain. Nausea vomiting and diarrhea as well. Suspect viral illness. Maybe some mild pancreatitis as well.: Mild tenderness, exam. Lipase is mildly elevated. She was treated symptomatically with improvement of  symptoms. Renal function is abnormal, but appears to be near her baseline. Significantly hyperglycemic, but not in dka. Improved prior to discharge. It has been determined that no acute conditions requiring further emergency intervention are present at this time. The patient has been advised of the diagnosis and plan. I reviewed any labs and imaging including any potential incidental findings. We have discussed signs and symptoms that warrant return to the ED and they are listed in the discharge instructions.      Virgel Manifold, MD 08/21/15 [phone]

## 2015-08-28 ENCOUNTER — Encounter: Payer: Self-pay | Admitting: Gastroenterology

## 2015-08-29 ENCOUNTER — Encounter (HOSPITAL_COMMUNITY)
Admission: RE | Admit: 2015-08-29 | Discharge: 2015-08-29 | Disposition: A | Discharge status: Home / Self Care | Payer: Managed Care, Other (non HMO) | Source: Ambulatory Visit | Attending: Nephrology | Admitting: Nephrology

## 2015-08-29 DIAGNOSIS — N182 Chronic kidney disease, stage 2 (mild): Secondary | ICD-10-CM | POA: Insufficient documentation

## 2015-08-29 LAB — POCT HEMOGLOBIN-HEMACUE: HEMOGLOBIN: 12.6 g/dL (ref 12.0–15.0)

## 2015-08-29 LAB — IRON AND TIBC
IRON: 67 ug/dL (ref 28–170)
Saturation Ratios: 25 % (ref 10.4–31.8)
TIBC: 269 ug/dL (ref 250–450)
UIBC: 202 ug/dL

## 2015-08-29 LAB — FERRITIN: FERRITIN: 499 ng/mL — AB (ref 11–307)

## 2015-08-29 MED ORDER — EPOETIN ALFA 40000 UNIT/ML IJ SOLN
30000.0000 [IU] | INTRAMUSCULAR | Status: DC
Start: 2015-08-29 — End: 2015-08-30

## 2015-09-03 ENCOUNTER — Encounter: Payer: Self-pay | Admitting: Family Medicine

## 2015-09-03 ENCOUNTER — Ambulatory Visit (INDEPENDENT_AMBULATORY_CARE_PROVIDER_SITE_OTHER): Payer: Managed Care, Other (non HMO) | Admitting: Family Medicine

## 2015-09-03 VITALS — BP 138/86 | HR 85 | Temp 98.2°F | Resp 18 | Ht 69.0 in | Wt 233.0 lb

## 2015-09-03 DIAGNOSIS — K5901 Slow transit constipation: Secondary | ICD-10-CM

## 2015-09-03 DIAGNOSIS — R110 Nausea: Secondary | ICD-10-CM | POA: Diagnosis not present

## 2015-09-03 DIAGNOSIS — K5900 Constipation, unspecified: Secondary | ICD-10-CM | POA: Insufficient documentation

## 2015-09-03 NOTE — Patient Instructions (Signed)
Constipation  Constipation is when a person has fewer than three bowel movements a week, has difficulty having a bowel movement, or has stools that are dry, hard, or larger than normal. As people grow older, constipation is more common. If you try to fix constipation with medicines that make you have a bowel movement (laxatives), the problem may get worse. Long-term laxative use may cause the muscles of the colon to become weak. A low-fiber diet, not taking in enough fluids, and taking certain medicines may make constipation worse.   CAUSES   · Certain medicines, such as antidepressants, pain medicine, iron supplements, antacids, and water pills.    · Certain diseases, such as diabetes, irritable bowel syndrome (IBS), thyroid disease, or depression.    · Not drinking enough water.    · Not eating enough fiber-rich foods.    · Stress or travel.    · Lack of physical activity or exercise.    · Ignoring the urge to have a bowel movement.    · Using laxatives too much.    SIGNS AND SYMPTOMS   · Having fewer than three bowel movements a week.    · Straining to have a bowel movement.    · Having stools that are hard, dry, or larger than normal.    · Feeling full or bloated.    · Pain in the lower abdomen.    · Not feeling relief after having a bowel movement.    DIAGNOSIS   Your health care provider will take a medical history and perform a physical exam. Further testing may be done for severe constipation. Some tests may include:  · A barium enema X-ray to examine your rectum, colon, and, sometimes, your small intestine.    · A sigmoidoscopy to examine your lower colon.    · A colonoscopy to examine your entire colon.  TREATMENT   Treatment will depend on the severity of your constipation and what is causing it. Some dietary treatments include drinking more fluids and eating more fiber-rich foods. Lifestyle treatments may include regular exercise. If these diet and lifestyle recommendations do not help, your health care  provider may recommend taking over-the-counter laxative medicines to help you have bowel movements. Prescription medicines may be prescribed if over-the-counter medicines do not work.   HOME CARE INSTRUCTIONS   · Eat foods that have a lot of fiber, such as fruits, vegetables, whole grains, and beans.  · Limit foods high in fat and processed sugars, such as french fries, hamburgers, cookies, candies, and soda.    · A fiber supplement may be added to your diet if you cannot get enough fiber from foods.    · Drink enough fluids to keep your urine clear or pale yellow.    · Exercise regularly or as directed by your health care provider.    · Go to the restroom when you have the urge to go. Do not hold it.    · Only take over-the-counter or prescription medicines as directed by your health care provider. Do not take other medicines for constipation without talking to your health care provider first.    SEEK IMMEDIATE MEDICAL CARE IF:   · You have bright red blood in your stool.    · Your constipation lasts for more than 4 days or gets worse.    · You have abdominal or rectal pain.    · You have thin, pencil-like stools.    · You have unexplained weight loss.  MAKE SURE YOU:   · Understand these instructions.  · Will watch your condition.  · Will get help right away if you are not   you have with your health care provider.   High-Fiber Diet Fiber is found in fruits, vegetables, and grains. A high-fiber diet encourages the addition of more whole grains, legumes, fruits, and vegetables in your diet. The recommended amount of fiber for adult males is 38 g per day. For adult females, it is 25 g per day. Pregnant and lactating women  should get 28 g of fiber per day. If you have a digestive or bowel problem, ask your caregiver for advice before adding high-fiber foods to your diet. Eat a variety of high-fiber foods instead of only a select few type of foods.  PURPOSE  To increase stool bulk.  To make bowel movements more regular to prevent constipation.  To lower cholesterol.  To prevent overeating. WHEN IS THIS DIET USED?  It may be used if you have constipation and hemorrhoids.  It may be used if you have uncomplicated diverticulosis (intestine condition) and irritable bowel syndrome.  It may be used if you need help with weight management.  It may be used if you want to add it to your diet as a protective measure against atherosclerosis, diabetes, and cancer. SOURCES OF FIBER  Whole-grain breads and cereals.  Fruits, such as apples, oranges, bananas, berries, prunes, and pears.  Vegetables, such as green peas, carrots, sweet potatoes, beets, broccoli, cabbage, spinach, and artichokes.  Legumes, such split peas, soy, lentils.  Almonds. FIBER CONTENT IN FOODS Starches and Grains / Dietary Fiber (g)  Cheerios, 1 cup / 3 g  Corn Flakes cereal, 1 cup / 0.7 g  Rice crispy treat cereal, 1 cup / 0.3 g  Instant oatmeal (cooked),  cup / 2 g  Frosted wheat cereal, 1 cup / 5.1 g  Brown, long-grain rice (cooked), 1 cup / 3.5 g  White, long-grain rice (cooked), 1 cup / 0.6 g  Enriched macaroni (cooked), 1 cup / 2.5 g Legumes / Dietary Fiber (g)  Baked beans (canned, plain, or vegetarian),  cup / 5.2 g  Kidney beans (canned),  cup / 6.8 g  Pinto beans (cooked),  cup / 5.5 g Breads and Crackers / Dietary Fiber (g)  Plain or honey graham crackers, 2 squares / 0.7 g  Saltine crackers, 3 squares / 0.3 g  Plain, salted pretzels, 10 pieces / 1.8 g  Whole-wheat bread, 1 slice / 1.9 g  White bread, 1 slice / 0.7 g  Raisin bread, 1 slice / 1.2 g  Plain bagel, 3 oz / 2 g  Flour tortilla, 1 oz  / 0.9 g  Corn tortilla, 1 small / 1.5 g  Hamburger or hotdog bun, 1 small / 0.9 g Fruits / Dietary Fiber (g)  Apple with skin, 1 medium / 4.4 g  Sweetened applesauce,  cup / 1.5 g  Banana,  medium / 1.5 g  Grapes, 10 grapes / 0.4 g  Orange, 1 small / 2.3 g  Raisin, 1.5 oz / 1.6 g  Melon, 1 cup / 1.4 g Vegetables / Dietary Fiber (g)  Green beans (canned),  cup / 1.3 g  Carrots (cooked),  cup / 2.3 g  Broccoli (cooked),  cup / 2.8 g  Peas (cooked),  cup / 4.4 g  Mashed potatoes,  cup / 1.6 g  Lettuce, 1 cup / 0.5 g  Corn (canned),  cup / 1.6 g  Tomato,  cup / 1.1 g Document Released: 12/13/2005 Document Revised: 06/13/2012 Document Reviewed: 03/16/2012 ExitCare Patient Information 2015 [place], [place]. This information is not intended to  replace advice given to you by your health care provider. Make sure you discuss any questions you have with your health care provider.  1. High fiber 2. Water!!! 3. Behavior: on toilet 30 minutes same time each day.  4. Miralax two times a day (1 cap full in 8 ounces of water) 5. XRAY/lab --> once results will call in suppository

## 2015-09-04 ENCOUNTER — Ambulatory Visit (HOSPITAL_BASED_OUTPATIENT_CLINIC_OR_DEPARTMENT_OTHER)
Admission: RE | Admit: 2015-09-04 | Discharge: 2015-09-04 | Disposition: A | Discharge status: Home / Self Care | Payer: Managed Care, Other (non HMO) | Source: Ambulatory Visit | Attending: Family Medicine | Admitting: Family Medicine

## 2015-09-04 ENCOUNTER — Telehealth: Payer: Self-pay | Admitting: Family Medicine

## 2015-09-04 DIAGNOSIS — E119 Type 2 diabetes mellitus without complications: Secondary | ICD-10-CM | POA: Diagnosis not present

## 2015-09-04 DIAGNOSIS — K5901 Slow transit constipation: Secondary | ICD-10-CM

## 2015-09-04 DIAGNOSIS — K5900 Constipation, unspecified: Secondary | ICD-10-CM | POA: Diagnosis present

## 2015-09-04 DIAGNOSIS — R109 Unspecified abdominal pain: Secondary | ICD-10-CM | POA: Insufficient documentation

## 2015-09-04 DIAGNOSIS — K589 Irritable bowel syndrome without diarrhea: Secondary | ICD-10-CM | POA: Insufficient documentation

## 2015-09-04 DIAGNOSIS — R110 Nausea: Secondary | ICD-10-CM | POA: Insufficient documentation

## 2015-09-04 LAB — COMPLETE METABOLIC PANEL WITH GFR
ALT: 19 U/L (ref 6–29)
AST: 15 U/L (ref 10–35)
Albumin: 3.3 g/dL — ABNORMAL LOW (ref 3.6–5.1)
Alkaline Phosphatase: 93 U/L (ref 33–115)
BUN: 40 mg/dL — ABNORMAL HIGH (ref 7–25)
CHLORIDE: 101 mmol/L (ref 98–110)
CO2: 19 mmol/L — AB (ref 20–31)
Calcium: 8.6 mg/dL (ref 8.6–10.2)
Creat: 3.05 mg/dL — ABNORMAL HIGH (ref 0.50–1.10)
GFR, EST AFRICAN AMERICAN: 20 mL/min — AB (ref >=60)
GFR, EST NON AFRICAN AMERICAN: 18 mL/min — AB (ref >=60)
Glucose, Bld: 371 mg/dL — ABNORMAL HIGH (ref 65–99)
POTASSIUM: 5.1 mmol/L (ref 3.5–5.3)
Sodium: 131 mmol/L — ABNORMAL LOW (ref 135–146)
Total Bilirubin: 0.3 mg/dL (ref 0.2–1.2)
Total Protein: 6.6 g/dL (ref 6.1–8.1)

## 2015-09-04 LAB — PHOSPHORUS: Phosphorus: 4.2 mg/dL (ref 2.5–4.5)

## 2015-09-04 MED ORDER — BISACODYL 10 MG RE SUPP: 10.0000 mg | RECTAL | Status: DC | PRN

## 2015-09-04 NOTE — Telephone Encounter (Signed)
Please call pt, her Xray did show a moderate to large stool burden, without any concerns for obstruction of her colon. [name] to continue to use the Miralax as we discussed on her visit yesterday and is provided in her AVS. I have also prescribed suppository for her to use once daily, until she has a bowel movement. I will need to follow-up with the patient on Monday. She experienced increased nausea, vomiting, abdominal pain, fever or unable to move her bowels she may need to be seen urgently over the weekend.

## 2015-09-04 NOTE — Progress Notes (Signed)
Subjective:    Patient ID: Heather [name], female    DOB: [birth year], 44 y.o.   MRN: [id number]  HPI Patient reports for acute visit for constipation.  Constipation: Patient states that she has been feeling full, not able to eat well without being nauseous. She has a decreased appetite. She denies any weight loss or fever. She i endorses flatulence. She's had difficulties with constipation in the past and had been using Miralax, however this had not been working for her recently. She was evaluated by Dr. Ricky Stabs office as well for her constipation. She admits to having to strain to attempt to have a stool, but still has not had a stool in over 3 weeks. Patient admits to mild epigastric pain. She also tried an enema once, that did not produce any stool. She has not tried suppositories. She had a recent TSH April 2016 that was normal. She was seen in the emergency room on 08/11/2015 for abdominal pain with diarrhea. It was suspected to be a viral illness, with possible mild pancreatitis secondary to a mildly increased lipase. Patient is an uncontrolled diabetic, last A1c 12.6, she is under the care of endocrinologist for her diabetes. She has known gastroparesis, slow transit. Patient has chronic kidney disease, last creatinine 3.22, 1 month ago. Patient is seen by her nephrologist every 3 months, she is not on dialysis and does produce urine. She feels her urine is darker than normal. Patient states that she drinks at least 6 bottles of water daily. She does receive iron transfusions and epoetin. She takes Lasix 80 mg 2 times a day.  Past Medical History  Diagnosis Date  . Gastroparesis   . Anemia   . IBS (irritable bowel syndrome)   . Nausea   . Hx: UTI (urinary tract infection)   . Fibroids     leiomyoma 8/04 per [name]  . Fibromyalgia   . Diabetes mellitus   . Hypertension   . Chronic kidney disease   . Allergy   . Blood transfusion without reported diagnosis   . Infertility     eval at  [facility]   Allergies  Allergen Reactions  . Ibuprofen Other (See Comments)    CKD stage 3. Should avoid.  . Dilaudid [Hydromorphone Hcl] Itching   Past Surgical History  Procedure Laterality Date  . Eye surgery      bilateral, vitrectomies  . Fibroid tumors removed    . Foot surgery      bilateral   Social History   Social History  . Marital Status: Divorced    Spouse Name: N/A  . Number of Children: 1  . Years of Education: college   Occupational History  .  [employer]   Social History Main Topics  . Smoking status: Never Smoker   . Smokeless tobacco: Never Used  . Alcohol Use: No  . Drug Use: No  . Sexual Activity:    Partners: Male    Birth Control/ Protection: None   Other Topics Concern  . Not on file   Social History Narrative   Patient lives with fiance. She has 1 grown son. Works full-time, she Paramedic for attorney.   Caffeine Use: 2 cups daily; sodas occasionally   Family History  Problem Relation Age of Onset  . Colon polyps Mother   . Diabetes Mother   . Anesthesia problems Neg Hx   . Hypotension Neg Hx   . Malignant hyperthermia Neg Hx   . Pseudochol deficiency Neg  Hx   . Heart murmur Father   . Hypertension Father   . Diabetes Sister   . Kidney failure Sister   . Diabetes Brother   . Retinal degeneration Brother   . Heart murmur Son   . Stroke Paternal Grandmother   . Diabetes Sister     Review of Systems ROS Negative, with the exception of above mentioned in HPI     Objective:   Physical Exam BP 138/86 mmHg  Pulse 85  Temp(Src) 98.2 F (36.8 C) (Temporal)  Resp 18  Ht 5\' 9"  (1.753 m)  Wt 233 lb (105.688 kg)  BMI 34.39 kg/m2  SpO2 100%  LMP 08/13/2015 Gen: Afebrile. No acute distress. Nontoxic in appearance, well-developed, well-nourished, obese African-American female. HENT: AT. [place]. MMM. Eyes:Pupils Equal Round Reactive to light, Extraocular movements intact, Conjunctiva without redness, discharge or  icterus. Neck: Supple, no lymphadenopathy, no thyromegaly CV: RRR , trace edema, +2/4 P posterior tibialis pulses Chest: CTAB, no wheeze or crackles Abd: Soft. Obese, mild tenderness to epigastric region, distended with moderate palpable stool burden.     Assessment & Plan:  1. Slow transit constipation - Patient to increase her fiber daily, AVS on high-fiber foods was given. Goal 25-35 g of fiber a day. [name] to make certain she is drinking plenty of water daily, as long as she's never been placed on a fluid restriction by her specialist. - Encourage behavior modification with 30 minutes of total time each day at the same time every day. - Miralax 1 capful, 2 times a day and 8 ounces of water each time. - DG Abd 2 Views; Future - COMPLETE METABOLIC PANEL WITH GFR - Phosphorus - Will rule out obstruction, by x-ray. If no obstruction will prescribe Dulcolax suppositories for her to use to use one suppository daily, and he'll she is able to have a bowel movement. Red flags discussed with patient, and AVS given on constipation and warning signs and when to be seen immediately. - Would advise against continued enema use with kidney disease, as only some enemas are safe, and patient does not seem to understand the different types of enemas.  2. Nausea without vomiting - Most likely secondary to gastroparesis and constipation. Please see above.

## 2015-09-07 ENCOUNTER — Emergency Department (HOSPITAL_COMMUNITY)
Admission: EM | Admit: 2015-09-07 | Discharge: 2015-09-07 | Disposition: A | Discharge status: Home / Self Care | Payer: Managed Care, Other (non HMO) | Attending: Emergency Medicine | Admitting: Emergency Medicine

## 2015-09-07 ENCOUNTER — Encounter (HOSPITAL_COMMUNITY): Payer: Self-pay

## 2015-09-07 ENCOUNTER — Emergency Department (HOSPITAL_COMMUNITY): Payer: Managed Care, Other (non HMO)

## 2015-09-07 DIAGNOSIS — E119 Type 2 diabetes mellitus without complications: Secondary | ICD-10-CM | POA: Insufficient documentation

## 2015-09-07 DIAGNOSIS — N189 Chronic kidney disease, unspecified: Secondary | ICD-10-CM | POA: Insufficient documentation

## 2015-09-07 DIAGNOSIS — Z86018 Personal history of other benign neoplasm: Secondary | ICD-10-CM | POA: Insufficient documentation

## 2015-09-07 DIAGNOSIS — N8320 Unspecified ovarian cysts: Secondary | ICD-10-CM | POA: Insufficient documentation

## 2015-09-07 DIAGNOSIS — D649 Anemia, unspecified: Secondary | ICD-10-CM | POA: Diagnosis not present

## 2015-09-07 DIAGNOSIS — Z79899 Other long term (current) drug therapy: Secondary | ICD-10-CM | POA: Insufficient documentation

## 2015-09-07 DIAGNOSIS — Z794 Long term (current) use of insulin: Secondary | ICD-10-CM | POA: Diagnosis not present

## 2015-09-07 DIAGNOSIS — I129 Hypertensive chronic kidney disease with stage 1 through stage 4 chronic kidney disease, or unspecified chronic kidney disease: Secondary | ICD-10-CM | POA: Insufficient documentation

## 2015-09-07 DIAGNOSIS — R109 Unspecified abdominal pain: Secondary | ICD-10-CM

## 2015-09-07 DIAGNOSIS — R1084 Generalized abdominal pain: Secondary | ICD-10-CM | POA: Diagnosis present

## 2015-09-07 DIAGNOSIS — N83201 Unspecified ovarian cyst, right side: Secondary | ICD-10-CM

## 2015-09-07 DIAGNOSIS — K5900 Constipation, unspecified: Secondary | ICD-10-CM | POA: Insufficient documentation

## 2015-09-07 DIAGNOSIS — Z87440 Personal history of urinary (tract) infections: Secondary | ICD-10-CM | POA: Diagnosis not present

## 2015-09-07 LAB — COMPREHENSIVE METABOLIC PANEL
ALK PHOS: 88 U/L (ref 38–126)
ALT: 20 U/L (ref 14–54)
ANION GAP: 7 (ref 5–15)
AST: 16 U/L (ref 15–41)
Albumin: 3.5 g/dL (ref 3.5–5.0)
BILIRUBIN TOTAL: 1 mg/dL (ref 0.3–1.2)
BUN: 52 mg/dL — ABNORMAL HIGH (ref 6–20)
CALCIUM: 9.2 mg/dL (ref 8.9–10.3)
CO2: 22 mmol/L (ref 22–32)
CREATININE: 3.22 mg/dL — AB (ref 0.44–1.00)
Chloride: 103 mmol/L (ref 101–111)
GFR, EST AFRICAN AMERICAN: 19 mL/min — AB (ref >60)
GFR, EST NON AFRICAN AMERICAN: 16 mL/min — AB (ref >60)
Glucose, Bld: 416 mg/dL — ABNORMAL HIGH (ref 65–99)
Potassium: 6.1 mmol/L (ref 3.5–5.1)
Sodium: 132 mmol/L — ABNORMAL LOW (ref 135–145)
TOTAL PROTEIN: 7.4 g/dL (ref 6.5–8.1)

## 2015-09-07 LAB — URINALYSIS, ROUTINE W REFLEX MICROSCOPIC
Bilirubin Urine: NEGATIVE
KETONES UR: NEGATIVE mg/dL
LEUKOCYTES UA: NEGATIVE
NITRITE: NEGATIVE
PROTEIN: 100 mg/dL — AB
Specific Gravity, Urine: 1.019 (ref 1.005–1.030)
UROBILINOGEN UA: 0.2 mg/dL (ref 0.0–1.0)
pH: 6 (ref 5.0–8.0)

## 2015-09-07 LAB — CBC
HCT: 38.5 % (ref 36.0–46.0)
HEMOGLOBIN: 12 g/dL (ref 12.0–15.0)
MCH: 28.7 pg (ref 26.0–34.0)
MCHC: 31.2 g/dL (ref 30.0–36.0)
MCV: 92.1 fL (ref 78.0–100.0)
PLATELETS: 257 10*3/uL (ref 150–400)
RBC: 4.18 MIL/uL (ref 3.87–5.11)
RDW: 13.8 % (ref 11.5–15.5)
WBC: 6.4 10*3/uL (ref 4.0–10.5)

## 2015-09-07 LAB — URINE MICROSCOPIC-ADD ON

## 2015-09-07 LAB — I-STAT BETA HCG BLOOD, ED (MC, WL, AP ONLY)

## 2015-09-07 LAB — LIPASE, BLOOD: Lipase: 43 U/L (ref 22–51)

## 2015-09-07 MED ORDER — SODIUM BICARBONATE 8.4 % IV SOLN
50.0000 meq | Freq: Once | INTRAVENOUS | Status: AC
  Administered 2015-09-07: 50 meq via INTRAVENOUS
  Filled 2015-09-07: qty 50

## 2015-09-07 MED ORDER — SODIUM CHLORIDE 0.9 % IV BOLUS (SEPSIS)
1000.0000 mL | Freq: Once | INTRAVENOUS | Status: AC
  Administered 2015-09-07: 1000 mL via INTRAVENOUS

## 2015-09-07 MED ORDER — SENNOSIDES-DOCUSATE SODIUM 8.6-50 MG PO TABS: 2.0000 | ORAL_TABLET | Freq: Every day | ORAL | Status: DC

## 2015-09-07 MED ORDER — PEG 3350-KCL-NABCB-NACL-NASULF 240 G PO SOLR: 4000.0000 mL | Freq: Once | ORAL | Status: DC

## 2015-09-07 NOTE — ED Provider Notes (Signed)
CSN: [id number]     Arrival date & time 09/07/15  1012 History   First MD Initiated Contact with Patient 09/07/15 1255     Chief Complaint  Patient presents with  . Constipation  . Back Pain  . Abdominal Pain     HPI  Patient presents with concern of ongoing abdominal pain, constipation. Pain is diffuse across the abdomen, no relief with OTC medication or may relax. There is no vomiting, though there is anorexia, mild nausea. There is no new fever, chills, dyspnea, chest pain. No urinary complaints.   Past Medical History  Diagnosis Date  . Gastroparesis   . Anemia   . IBS (irritable bowel syndrome)   . Nausea   . Hx: UTI (urinary tract infection)   . Fibroids     leiomyoma 8/04 per [name]  . Fibromyalgia   . Diabetes mellitus   . Hypertension   . Chronic kidney disease   . Allergy   . Blood transfusion without reported diagnosis   . Infertility     eval at [facility]   Past Surgical History  Procedure Laterality Date  . Eye surgery      bilateral, vitrectomies  . Fibroid tumors removed    . Foot surgery      bilateral   Family History  Problem Relation Age of Onset  . Colon polyps Mother   . Diabetes Mother   . Anesthesia problems Neg Hx   . Hypotension Neg Hx   . Malignant hyperthermia Neg Hx   . Pseudochol deficiency Neg Hx   . Heart murmur Father   . Hypertension Father   . Diabetes Sister   . Kidney failure Sister   . Diabetes Brother   . Retinal degeneration Brother   . Heart murmur Son   . Stroke Paternal Grandmother   . Diabetes Sister    Social History  Substance Use Topics  . Smoking status: Never Smoker   . Smokeless tobacco: Never Used  . Alcohol Use: No   OB History    Gravida Para Term Preterm AB TAB SAB Ectopic Multiple Living   2 1   1  1   1      Review of Systems  Constitutional:       Per HPI, otherwise negative  HENT:       Per HPI, otherwise negative  Respiratory:       Per HPI, otherwise negative  Cardiovascular:       Per  HPI, otherwise negative  Gastrointestinal: Positive for nausea and abdominal pain. Negative for vomiting.  Endocrine:       Negative aside from HPI  Genitourinary:       Neg aside from HPI   Musculoskeletal:       Per HPI, otherwise negative  Skin: Negative.   Neurological: Negative for syncope.      Allergies  Ibuprofen and Dilaudid  Home Medications   Prior to Admission medications   Medication Sig Start Date End Date Taking? Authorizing Provider  amLODipine (NORVASC) 5 MG tablet Take 1 tablet (5 mg total) by mouth daily. 07/17/15  Yes Irene Pap, NP  calcitRIOL (ROCALTROL) 0.25 MCG capsule Take 0.5 mcg by mouth at bedtime.    Yes Historical Provider, MD  eletriptan (RELPAX) 40 MG tablet Take 1 tablet (40 mg total) by mouth once. Repeat once, in 2h, if still have headache. Do not take more than 2 tablets in 24h. 07/17/15 02/08/16 Yes Irene Pap, NP  epoetin alfa (  EPOGEN,PROCRIT) 09811 UNIT/ML injection Inject 30,000 Units into the vein every 14 (fourteen) days.    Yes Historical Provider, MD  ferumoxytol Shirlean Kelly) 510 MG/17ML SOLN injection Inject 1,020 mg into the vein every 14 (fourteen) days.    Yes Historical Provider, MD  furosemide (LASIX) 80 MG tablet Take 1 tablet (80 mg total) by mouth 2 (two) times daily. Patient taking differently: Take 160 mg by mouth daily.  04/12/15  Yes Irene Pap, NP  gabapentin (NEURONTIN) 300 MG capsule Take 1 capsule (300 mg total) by mouth 2 (two) times daily as needed. Patient taking differently: Take 300 mg by mouth 2 (two) times daily as needed (for patin).  07/17/15  Yes Irene Pap, NP  glucose blood (ONETOUCH VERIO) test strip Test 4 times daily. Patient taking differently: 1 each by Other route 3 (three) times daily.  07/17/15  Yes Irene Pap, NP  insulin detemir (LEVEMIR) 100 UNIT/ML injection Inject 0.19 mLs (19 Units total) into the skin daily. Inject at lunch time. Patient taking differently: Inject 19 Units into the  skin at bedtime.  04/30/15  Yes Irene Pap, NP  insulin lispro (HUMALOG) 100 UNIT/ML injection Inject 0.1 mLs (10 Units total) into the skin 3 (three) times daily before meals. 04/30/15  Yes Irene Pap, NP  lisinopril (PRINIVIL,ZESTRIL) 40 MG tablet Take 1 tablet (40 mg total) by mouth daily. 07/17/15  Yes Irene Pap, NP  atorvastatin (LIPITOR) 10 MG tablet Take 1 tablet (10 mg total) by mouth daily. Patient not taking: Reported on 07/17/2015 04/30/15   Irene Pap, NP  b complex vitamins tablet Take 1 tablet by mouth daily. Patient not taking: Reported on 09/03/2015 07/17/15   Irene Pap, NP  bisacodyl (DULCOLAX) 10 MG suppository Place 1 suppository (10 mg total) rectally as needed for moderate constipation. Patient not taking: Reported on 09/07/2015 09/04/15   Renee A Kuneff, DO  Cholecalciferol ([employer] VIT D 5000 HIGH-POTENCY) 5000 UNITS capsule Take 1 capsule (5,000 Units total) by mouth daily. Patient not taking: Reported on 09/03/2015 07/17/15   Irene Pap, NP  LORazepam (ATIVAN) 0.5 MG tablet Take 1 tablet (0.5 mg total) by mouth daily as needed for anxiety. Patient not taking: Reported on 08/11/2015 04/30/15   Irene Pap, NP  ondansetron (ZOFRAN) 4 MG tablet Take 1 tablet (4 mg total) by mouth every 6 (six) hours. Patient not taking: Reported on 09/03/2015 08/11/15   Virgel Manifold, MD  polyethylene glycol (COLYTE) 240 G solution Take 4,000 mLs by mouth once. 09/07/15   [name], MD  senna-docusate (SENOKOT-S) 8.6-50 MG per tablet Take 2 tablets by mouth daily. 09/07/15   [name], MD   BP 183/108 mmHg  Pulse 82  Temp(Src) 98.2 F (36.8 C) (Oral)  Resp 14  SpO2 99%  LMP 08/13/2015 Physical Exam  Constitutional: [name] oriented to person, place, and time. She appears well-developed and well-nourished. No distress.  Obese female uncomfortable, lying in bed  HENT:  Head: Normocephalic and atraumatic.  Eyes: Conjunctivae and EOM are normal.  Cardiovascular: Normal rate  and regular rhythm.   Pulmonary/Chest: Effort normal and breath sounds normal. No stridor. No respiratory distress.  Abdominal: She exhibits no distension.    Musculoskeletal: She exhibits no edema.  Neurological: [name] alert and oriented to person, place, and time. No cranial nerve deficit.  Skin: Skin is warm and dry.  Psychiatric: She has a normal mood and affect.  Nursing note and vitals reviewed.  ED Course  Procedures (including critical care time) Labs Review Labs Reviewed  COMPREHENSIVE METABOLIC PANEL - Abnormal; Notable for the following:    Sodium 132 (*)    Potassium 6.1 (*)    Glucose, Bld 416 (*)    BUN 52 (*)    Creatinine, Ser 3.22 (*)    GFR calc non Af Amer 16 (*)    GFR calc Af Amer 19 (*)    All other components within normal limits  URINALYSIS, ROUTINE W REFLEX MICROSCOPIC (NOT AT [facility]) - Abnormal; Notable for the following:    Glucose, UA >1000 (*)    Hgb urine dipstick TRACE (*)    Protein, ur 100 (*)    All other components within normal limits  LIPASE, BLOOD  CBC  URINE MICROSCOPIC-ADD ON  I-STAT BETA HCG BLOOD, ED ([facility], [facility], [facility] ONLY)    Imaging Review Ct Abdomen Pelvis Wo Contrast  09/07/2015   CLINICAL DATA:  Intermittent abdominal pain for 1 year  EXAM: CT ABDOMEN AND PELVIS WITHOUT CONTRAST  TECHNIQUE: Multidetector CT imaging of the abdomen and pelvis was performed following the standard protocol without IV contrast.  COMPARISON:  09/04/2015  FINDINGS: Lung bases are free of acute infiltrate or sizable effusion.  The liver, gallbladder, spleen, adrenal glands and pancreas are within normal limits. The kidneys are well visualized bilaterally. No renal calculi or urinary tract obstructive changes are noted.  The appendix is well visualized and within normal limits. No obstructive changes are seen. No changes of diverticulosis or diverticulitis are noted. The bladder is well distended. The uterus is within normal limits. A dominant right ovarian cystic  lesion is seen measuring at least 3.9 cm in greatest dimension. No free pelvic fluid is noted. No lymphadenopathy is seen. Bony structures show mild scoliosis of the thoracolumbar spine. No acute bony abnormality is noted.  IMPRESSION: Right ovarian cyst  No other focal abnormality is seen.   Electronically Signed   By: Inez Catalina M.D.   On: 09/07/2015 15:19   I have personally reviewed and evaluated these images and lab results as part of my medical decision-making.   EKG Interpretation   Date/Time:  Sunday September 07 2015 13:40:46 EDT Ventricular Rate:  81 PR Interval:  135 QRS Duration: 93 QT Interval:  363 QTC Calculation: 421 R Axis:   48 Text Interpretation:  Sinus rhythm Low voltage, precordial leads Baseline  wander in lead(s) V4 Sinus rhythm Non-specific intra-ventricular  conduction delay Artifact Abnormal ekg Confirmed by [name]  MD  [phone]) on 09/07/2015 2:40:11 PM      MDM   Final diagnoses:  Generalized abdominal pain  Constipation, unspecified constipation type  Cyst of right ovary   Patient results with no bowel movements in weeks according to her. Patient is diffusely tender on exam, though with no peritoneal findings. Patient's CT scan suggests constipation, the presence of a right ovarian cyst. No evidence for appendicitis, or acute bowel obstruction. Patient remained hemodynamically stable during her emergency room course, her blood pressure readings were elevated, though consistent for her. No evidence for end organ effects. Patient discharged in stable condition with initiation of a bowel regimen.  [name], MD 09/07/15 [phone]

## 2015-09-07 NOTE — Discharge Instructions (Signed)
As discussed, with today's finding constipation, right ovarian cyst, as contributing to your abdominal pain, it is important that you take all medication as directed, monitor your condition carefully, and do not hesitate to return here for concerning changes in your condition.  If the provided medication does not assist in decreasing your pain, please be sure to follow-up with our gastroenterology colleagues.

## 2015-09-07 NOTE — ED Notes (Signed)
Pt c/o intermittent abdominal pain/pressure, mid back pain, and constipation since 8/15.  Pain score 10/10.  Pt reports PCP started her on Miralax w/o relief.

## 2015-09-10 ENCOUNTER — Encounter: Payer: Self-pay | Admitting: Family Medicine

## 2015-09-10 ENCOUNTER — Ambulatory Visit (INDEPENDENT_AMBULATORY_CARE_PROVIDER_SITE_OTHER): Payer: Managed Care, Other (non HMO) | Admitting: Family Medicine

## 2015-09-10 VITALS — BP 132/68 | HR 95 | Temp 98.4°F | Resp 18 | Ht 69.0 in | Wt 232.0 lb

## 2015-09-10 DIAGNOSIS — K5901 Slow transit constipation: Secondary | ICD-10-CM | POA: Diagnosis not present

## 2015-09-10 MED ORDER — SENNOSIDES-DOCUSATE SODIUM 8.6-50 MG PO TABS: 2.0000 | ORAL_TABLET | Freq: Every day | ORAL | Status: DC

## 2015-09-10 NOTE — Progress Notes (Signed)
Pre visit review using our clinic review tool, if applicable. No additional management support is needed unless otherwise documented below in the visit note. 

## 2015-09-10 NOTE — Patient Instructions (Signed)
Senna pills daily. This stimulates the colon and you may need this daily with your history.  Start the miralax 1 cap in 8  ounces of water a day. If you are still straining or not having stools you may need this twice a day.  Use the suppository tonight and then Friday as well to help relieve the stool from below.   Constipation Constipation is when a person has fewer than three bowel movements a week, has difficulty having a bowel movement, or has stools that are dry, hard, or larger than normal. As people grow older, constipation is more common. If you try to fix constipation with medicines that make you have a bowel movement (laxatives), the problem may get worse. Long-term laxative use may cause the muscles of the colon to become weak. A low-fiber diet, not taking in enough fluids, and taking certain medicines may make constipation worse.  CAUSES   Certain medicines, such as antidepressants, pain medicine, iron supplements, antacids, and water pills.   Certain diseases, such as diabetes, irritable bowel syndrome (IBS), thyroid disease, or depression.   Not drinking enough water.   Not eating enough fiber-rich foods.   Stress or travel.   Lack of physical activity or exercise.   Ignoring the urge to have a bowel movement.   Using laxatives too much.  SIGNS AND SYMPTOMS   Having fewer than three bowel movements a week.   Straining to have a bowel movement.   Having stools that are hard, dry, or larger than normal.   Feeling full or bloated.   Pain in the lower abdomen.   Not feeling relief after having a bowel movement.  DIAGNOSIS  Your health care provider will take a medical history and perform a physical exam. Further testing may be done for severe constipation. Some tests may include:  A barium enema X-ray to examine your rectum, colon, and, sometimes, your small intestine.   A sigmoidoscopy to examine your lower colon.   A colonoscopy to examine your  entire colon. TREATMENT  Treatment will depend on the severity of your constipation and what is causing it. Some dietary treatments include drinking more fluids and eating more fiber-rich foods. Lifestyle treatments may include regular exercise. If these diet and lifestyle recommendations do not help, your health care provider may recommend taking over-the-counter laxative medicines to help you have bowel movements. Prescription medicines may be prescribed if over-the-counter medicines do not work.  HOME CARE INSTRUCTIONS   Eat foods that have a lot of fiber, such as fruits, vegetables, whole grains, and beans.  Limit foods high in fat and processed sugars, such as french fries, hamburgers, cookies, candies, and soda.   A fiber supplement may be added to your diet if you cannot get enough fiber from foods.   Drink enough fluids to keep your urine clear or pale yellow.   Exercise regularly or as directed by your health care provider.   Go to the restroom when you have the urge to go. Do not hold it.   Only take over-the-counter or prescription medicines as directed by your health care provider. Do not take other medicines for constipation without talking to your health care provider first.  [place] IF:   You have bright red blood in your stool.   Your constipation lasts for more than 4 days or gets worse.   You have abdominal or rectal pain.   You have thin, pencil-like stools.   You have unexplained weight loss.  MAKE SURE YOU:   Understand these instructions.  Will watch your condition.  Will get help right away if you are not doing well or get worse. Document Released: 09/10/2004 Document Revised: 12/18/2013 Document Reviewed: 09/24/2013 [facility] Patient Information 2015 [place], [place]. This information is not intended to replace advice given to you by your health care provider. Make sure you discuss any questions you have with your health care  provider.

## 2015-09-10 NOTE — Progress Notes (Signed)
Subjective:    Patient ID: Heather [name], female    DOB: 14-Mar-1971, 44 y.o.   MRN: [id number]  HPI Patient returns for follow-up visit on constipation Constipation: Patient returns to follow-up visit on constipation. She was seen last week and x-ray showed a moderate stool burden. Patient was encouraged to take Miralax 1-2 times a day, and she did not do this. Patient was prescribed Dulcolax suppositories, and she did not use these. She reports her dehydration, and tolerating small meals. She was seen in the emergency room this past Sunday for same presentation with abdominal pain and constipation. CT scan at that time showed no acute issues. Patient was given GoLYTELY and prescribed Senokot tablets. She states that after drinking the GoLYTELY she did have a bowel movement. She has not started the Senokot tablets. She reports her last bowel movement was yesterday, but is watery. She still has a decreased appetite and a feeling of fullness. She denies any melena, hematochezia or foul-smelling stools. She denies fever. Past Medical History  Diagnosis Date  . Gastroparesis   . Anemia   . IBS (irritable bowel syndrome)   . Nausea   . Hx: UTI (urinary tract infection)   . Fibroids     leiomyoma 8/04 per [name]  . Fibromyalgia   . Diabetes mellitus   . Hypertension   . Chronic kidney disease   . Allergy   . Blood transfusion without reported diagnosis   . Infertility     eval at [facility]   Allergies  Allergen Reactions  . Ibuprofen Other (See Comments)    CKD stage 3. Should avoid.  . Dilaudid [Hydromorphone Hcl] Itching   Family History  Problem Relation Age of Onset  . Colon polyps Mother   . Diabetes Mother   . Anesthesia problems Neg Hx   . Hypotension Neg Hx   . Malignant hyperthermia Neg Hx   . Pseudochol deficiency Neg Hx   . Heart murmur Father   . Hypertension Father   . Diabetes Sister   . Kidney failure Sister   . Diabetes Brother   . Retinal degeneration Brother   .  Heart murmur Son   . Stroke Paternal Grandmother   . Diabetes Sister    Social History   Social History  . Marital Status: Married    Spouse Name: N/A  . Number of Children: 1  . Years of Education: college   Occupational History  .  [employer]   Social History Main Topics  . Smoking status: Never Smoker   . Smokeless tobacco: Never Used  . Alcohol Use: No  . Drug Use: No  . Sexual Activity:    Partners: Male    Birth Control/ Protection: None   Other Topics Concern  . Not on file   Social History Narrative   Patient lives with fiance. She has 1 grown son. Works full-time, she Paramedic for attorney.   Caffeine Use: 2 cups daily; sodas occasionally     Health Maintenance: pt due for flu shot today Review of Systems Negative, with the exception of above mentioned in HPI     Objective:   Physical Exam BP 164/100 mmHg  Pulse 95  Temp(Src) 98.4 F (36.9 C) (Temporal)  Resp 18  Ht 5\' 9"  (1.753 m)  Wt 232 lb (105.235 kg)  BMI 34.24 kg/m2  SpO2 98%  LMP 09/10/2015 Gen: Afebrile. No acute distress. Nontoxic in appearance, well-developed, well-nourished [place] female. Pleasant. HENT: WNL Eyes:  Conjunctiva without redness, discharge or icterus. Abd: Soft. Obese. Mildly distended, mild tenderness to palpation diffusely. Hypoactive bowel sounds. Large stool burden palpated.     Assessment & Plan:  1. Slow transit constipation - ED records reviewed, patient ended up needing to be seen 3 days after being seen in the office for continued abdominal pain with constipation. CT scan at that time showed no acute issues. Patient encouraged to continue miralax 1 capful in 8 ounces water daily, if not producing normal stools within a few days, she was encouraged to go to 1 capful /8 ounces water twice a day. - Discussed with patient the idea behind the different types of medication she is prescribed and how they're used. Patient seems to be resistant to use  medications as prescribed. - Patient encouraged to use Dulcolax suppository tonight and then Friday. If not having steady bowel movement can repeat this again on Sunday. - Patient encouraged to continue senna daily to help stimulate her bowels. - Keep gastroenterology appointment in October. - Red flags discussed with patient, we will only need to follow-up on her constipation if she's having any complications.  2. Health Maintenance: pt declined flu shot today

## 2015-09-11 ENCOUNTER — Other Ambulatory Visit (HOSPITAL_COMMUNITY): Payer: Self-pay | Admitting: *Deleted

## 2015-09-12 ENCOUNTER — Encounter (HOSPITAL_COMMUNITY): Payer: Self-pay | Admitting: Emergency Medicine

## 2015-09-12 ENCOUNTER — Emergency Department (HOSPITAL_COMMUNITY)
Admission: EM | Admit: 2015-09-12 | Discharge: 2015-09-12 | Disposition: A | Discharge status: Home / Self Care | Payer: Managed Care, Other (non HMO) | Attending: Emergency Medicine | Admitting: Emergency Medicine

## 2015-09-12 ENCOUNTER — Inpatient Hospital Stay (HOSPITAL_COMMUNITY): Admission: RE | Admit: 2015-09-12 | Payer: Managed Care, Other (non HMO) | Source: Ambulatory Visit

## 2015-09-12 DIAGNOSIS — Z794 Long term (current) use of insulin: Secondary | ICD-10-CM | POA: Diagnosis not present

## 2015-09-12 DIAGNOSIS — I129 Hypertensive chronic kidney disease with stage 1 through stage 4 chronic kidney disease, or unspecified chronic kidney disease: Secondary | ICD-10-CM | POA: Diagnosis not present

## 2015-09-12 DIAGNOSIS — K5900 Constipation, unspecified: Secondary | ICD-10-CM | POA: Insufficient documentation

## 2015-09-12 DIAGNOSIS — Z79899 Other long term (current) drug therapy: Secondary | ICD-10-CM | POA: Insufficient documentation

## 2015-09-12 DIAGNOSIS — E119 Type 2 diabetes mellitus without complications: Secondary | ICD-10-CM | POA: Diagnosis not present

## 2015-09-12 DIAGNOSIS — Z86018 Personal history of other benign neoplasm: Secondary | ICD-10-CM | POA: Diagnosis not present

## 2015-09-12 DIAGNOSIS — R42 Dizziness and giddiness: Secondary | ICD-10-CM | POA: Diagnosis not present

## 2015-09-12 DIAGNOSIS — R110 Nausea: Secondary | ICD-10-CM | POA: Insufficient documentation

## 2015-09-12 DIAGNOSIS — N189 Chronic kidney disease, unspecified: Secondary | ICD-10-CM | POA: Diagnosis not present

## 2015-09-12 DIAGNOSIS — Z87440 Personal history of urinary (tract) infections: Secondary | ICD-10-CM | POA: Diagnosis not present

## 2015-09-12 DIAGNOSIS — Z862 Personal history of diseases of the blood and blood-forming organs and certain disorders involving the immune mechanism: Secondary | ICD-10-CM | POA: Diagnosis not present

## 2015-09-12 DIAGNOSIS — R1084 Generalized abdominal pain: Secondary | ICD-10-CM | POA: Diagnosis present

## 2015-09-12 LAB — CBC WITH DIFFERENTIAL/PLATELET
BASOS ABS: 0 10*3/uL (ref 0.0–0.1)
BASOS PCT: 0 %
Eosinophils Absolute: 0.1 10*3/uL (ref 0.0–0.7)
Eosinophils Relative: 1 %
HEMATOCRIT: 36.4 % (ref 36.0–46.0)
HEMOGLOBIN: 11.4 g/dL — AB (ref 12.0–15.0)
Lymphocytes Relative: 25 %
Lymphs Abs: 1.7 10*3/uL (ref 0.7–4.0)
MCH: 28.6 pg (ref 26.0–34.0)
MCHC: 31.3 g/dL (ref 30.0–36.0)
MCV: 91.2 fL (ref 78.0–100.0)
MONO ABS: 0.5 10*3/uL (ref 0.1–1.0)
Monocytes Relative: 8 %
NEUTROS ABS: 4.5 10*3/uL (ref 1.7–7.7)
NEUTROS PCT: 66 %
Platelets: 253 10*3/uL (ref 150–400)
RBC: 3.99 MIL/uL (ref 3.87–5.11)
RDW: 13.7 % (ref 11.5–15.5)
WBC: 6.8 10*3/uL (ref 4.0–10.5)

## 2015-09-12 LAB — COMPREHENSIVE METABOLIC PANEL
ALBUMIN: 3.1 g/dL — AB (ref 3.5–5.0)
ALT: 20 U/L (ref 14–54)
AST: 20 U/L (ref 15–41)
Alkaline Phosphatase: 87 U/L (ref 38–126)
Anion gap: 8 (ref 5–15)
BILIRUBIN TOTAL: 0.6 mg/dL (ref 0.3–1.2)
BUN: 37 mg/dL — AB (ref 6–20)
CO2: 24 mmol/L (ref 22–32)
Calcium: 8.7 mg/dL — ABNORMAL LOW (ref 8.9–10.3)
Chloride: 102 mmol/L (ref 101–111)
Creatinine, Ser: 4.36 mg/dL — ABNORMAL HIGH (ref 0.44–1.00)
GFR calc Af Amer: 13 mL/min — ABNORMAL LOW (ref >60)
GFR calc non Af Amer: 11 mL/min — ABNORMAL LOW (ref >60)
GLUCOSE: 209 mg/dL — AB (ref 65–99)
POTASSIUM: 4.7 mmol/L (ref 3.5–5.1)
Sodium: 134 mmol/L — ABNORMAL LOW (ref 135–145)
TOTAL PROTEIN: 6.9 g/dL (ref 6.5–8.1)

## 2015-09-12 LAB — LIPASE, BLOOD: Lipase: 24 U/L (ref 22–51)

## 2015-09-12 MED ORDER — HYDROCODONE-ACETAMINOPHEN 5-325 MG PO TABS
1.0000 | ORAL_TABLET | Freq: Once | ORAL | Status: AC
  Administered 2015-09-12: 1 via ORAL
  Filled 2015-09-12: qty 1

## 2015-09-12 MED ORDER — MORPHINE SULFATE (PF) 2 MG/ML IV SOLN
2.0000 mg | Freq: Once | INTRAVENOUS | Status: AC
  Administered 2015-09-12: 2 mg via INTRAVENOUS
  Filled 2015-09-12: qty 1

## 2015-09-12 MED ORDER — SODIUM CHLORIDE 0.9 % IV SOLN
1000.0000 mL | Freq: Once | INTRAVENOUS | Status: AC
Start: 2015-09-12 — End: 2015-09-12
  Administered 2015-09-12: 1000 mL via INTRAVENOUS

## 2015-09-12 MED ORDER — ONDANSETRON HCL 4 MG/2ML IJ SOLN
4.0000 mg | Freq: Once | INTRAMUSCULAR | Status: AC
  Administered 2015-09-12: 4 mg via INTRAVENOUS
  Filled 2015-09-12: qty 2

## 2015-09-12 MED ORDER — SODIUM CHLORIDE 0.9 % IV SOLN
1000.0000 mL | INTRAVENOUS | Status: DC
  Administered 2015-09-12: 1000 mL via INTRAVENOUS

## 2015-09-12 MED ORDER — METOCLOPRAMIDE HCL 5 MG/ML IJ SOLN
10.0000 mg | Freq: Once | INTRAMUSCULAR | Status: AC
  Administered 2015-09-12: 10 mg via INTRAVENOUS
  Filled 2015-09-12: qty 2

## 2015-09-12 NOTE — ED Notes (Signed)
Patient aware of need of urine specimen; patient stated she cannot go at this time; will check back with patient later; warm blanket given to patient

## 2015-09-12 NOTE — Discharge Instructions (Signed)
Please take Zofran as prescribed for nausea at home. Plan on following up with your primary care provider on Monday and call your GI doctor to schedule an appointment for next week.  Please return to the Emergency Department if symptoms worsen or new onset of fever, vomiting, blood in stool.

## 2015-09-12 NOTE — ED Notes (Signed)
Pt c/o abdominal pain since August 15th and dizziness onset yesterday. Pt reports nausea.

## 2015-09-12 NOTE — ED Provider Notes (Signed)
CSN: [id number]     Arrival date & time 09/12/15  [id number] History   First MD Initiated Contact with Patient 09/12/15 0957     Chief Complaint  Patient presents with  . Abdominal Pain     (Consider location/radiation/quality/duration/timing/severity/associated sxs/prior Treatment) HPI Comments: Pt is a 44 yo female with PMH of DM who presents to the ED with complaint of abdominal pain. Pt reports having diffuse abdominal pain with associated constipation since august 15th. Endorses nausea. She also reports dizziness and food and water tasting "odd" that started yesterday. She notes she has not had anything to eat or drink over the past 2 days and her last BM was 2 days ago. Pt was seen in the ED on 09/07/15, CT abdomen showed right ovarian cyst, pt dx with bowel regimen. Pt reports she saw her PCP on 09/08/15 and was told that she was still constipated and was given miralax. She notes she had multiple episodes of diarrhea after taking miralax for 1 day and states she has not had a BM for the past 2 days. Denies fever, headache, sore throat, cough, SOB, CP, vomiting, blood in urine or stool, urinary sxs, vaginal d/c. LMP 09/10/15. History of fibroid tumor removal.   Patient is a 44 y.o. female presenting with abdominal pain.  Abdominal Pain Associated symptoms: constipation and nausea     Past Medical History  Diagnosis Date  . Gastroparesis   . Anemia   . IBS (irritable bowel syndrome)   . Nausea   . Hx: UTI (urinary tract infection)   . Fibroids     leiomyoma 8/04 per [name]  . Fibromyalgia   . Diabetes mellitus   . Hypertension   . Chronic kidney disease   . Allergy   . Blood transfusion without reported diagnosis   . Infertility     eval at [facility]   Past Surgical History  Procedure Laterality Date  . Eye surgery      bilateral, vitrectomies  . Fibroid tumors removed    . Foot surgery      bilateral   Family History  Problem Relation Age of Onset  . Colon polyps Mother   . Diabetes  Mother   . Anesthesia problems Neg Hx   . Hypotension Neg Hx   . Malignant hyperthermia Neg Hx   . Pseudochol deficiency Neg Hx   . Heart murmur Father   . Hypertension Father   . Diabetes Sister   . Kidney failure Sister   . Diabetes Brother   . Retinal degeneration Brother   . Heart murmur Son   . Stroke Paternal Grandmother   . Diabetes Sister    Social History  Substance Use Topics  . Smoking status: Never Smoker   . Smokeless tobacco: Never Used  . Alcohol Use: No   OB History    Gravida Para Term Preterm AB TAB SAB Ectopic Multiple Living   2 1   1  1   1      Review of Systems  Gastrointestinal: Positive for nausea, abdominal pain and constipation.  Neurological: Positive for dizziness.  All other systems reviewed and are negative.     Allergies  Ibuprofen and Dilaudid  Home Medications   Prior to Admission medications   Medication Sig Start Date End Date Taking? Authorizing Provider  amLODipine (NORVASC) 5 MG tablet Take 1 tablet (5 mg total) by mouth daily. 07/17/15  Yes Irene Pap, NP  calcitRIOL (ROCALTROL) 0.25 MCG capsule Take 0.5 mcg by  mouth at bedtime.    Yes Historical Provider, MD  eletriptan (RELPAX) 40 MG tablet Take 1 tablet (40 mg total) by mouth once. Repeat once, in 2h, if still have headache. Do not take more than 2 tablets in 24h. 07/17/15 02/08/16 Yes Irene Pap, NP  furosemide (LASIX) 80 MG tablet Take 1 tablet (80 mg total) by mouth 2 (two) times daily. Patient taking differently: Take 160 mg by mouth daily as needed for fluid.  04/12/15  Yes Irene Pap, NP  gabapentin (NEURONTIN) 300 MG capsule Take 1 capsule (300 mg total) by mouth 2 (two) times daily as needed. Patient taking differently: Take 300 mg by mouth 2 (two) times daily as needed (for patin).  07/17/15  Yes Irene Pap, NP  insulin detemir (LEVEMIR) 100 UNIT/ML injection Inject 0.19 mLs (19 Units total) into the skin daily. Inject at lunch time. Patient taking  differently: Inject 19 Units into the skin at bedtime.  04/30/15  Yes Irene Pap, NP  insulin lispro (HUMALOG) 100 UNIT/ML injection Inject 0.1 mLs (10 Units total) into the skin 3 (three) times daily before meals. 04/30/15  Yes Irene Pap, NP  lisinopril (PRINIVIL,ZESTRIL) 40 MG tablet Take 1 tablet (40 mg total) by mouth daily. 07/17/15  Yes Irene Pap, NP  atorvastatin (LIPITOR) 10 MG tablet Take 1 tablet (10 mg total) by mouth daily. Patient not taking: Reported on 07/17/2015 04/30/15   Irene Pap, NP  b complex vitamins tablet Take 1 tablet by mouth daily. Patient not taking: Reported on 09/03/2015 07/17/15   Irene Pap, NP  bisacodyl (DULCOLAX) 10 MG suppository Place 1 suppository (10 mg total) rectally as needed for moderate constipation. Patient not taking: Reported on 09/07/2015 09/04/15   Renee A Kuneff, DO  Cholecalciferol ([employer] VIT D 5000 HIGH-POTENCY) 5000 UNITS capsule Take 1 capsule (5,000 Units total) by mouth daily. Patient not taking: Reported on 09/03/2015 07/17/15   Irene Pap, NP  epoetin alfa (EPOGEN,PROCRIT) 13086 UNIT/ML injection Inject 30,000 Units into the vein every 14 (fourteen) days.     Historical Provider, MD  ferumoxytol Shirlean Kelly) 510 MG/17ML SOLN injection Inject 1,020 mg into the vein every 14 (fourteen) days.     Historical Provider, MD  glucose blood (ONETOUCH VERIO) test strip Test 4 times daily. Patient taking differently: 1 each by Other route 3 (three) times daily.  07/17/15   Irene Pap, NP  LORazepam (ATIVAN) 0.5 MG tablet Take 1 tablet (0.5 mg total) by mouth daily as needed for anxiety. Patient not taking: Reported on 08/11/2015 04/30/15   Irene Pap, NP  senna-docusate (SENOKOT-S) 8.6-50 MG per tablet Take 2 tablets by mouth daily. Patient not taking: Reported on 09/12/2015 09/10/15   Renee A Kuneff, DO   BP 118/73 mmHg  Pulse 91  Temp(Src) 98.6 F (37 C) (Oral)  Resp 18  Ht 5\' 9"  (1.753 m)  Wt 230 lb (104.327 kg)  BMI 33.95 kg/m2   SpO2 98%  LMP 09/10/2015 Physical Exam  Constitutional: [name] oriented to person, place, and time. She appears well-developed and well-nourished.  HENT:  Head: Normocephalic and atraumatic.  Mouth/Throat: Uvula is midline, oropharynx is clear and moist and mucous membranes are normal. No oropharyngeal exudate.  Eyes: Conjunctivae and EOM are normal. Pupils are equal, round, and reactive to light. Right eye exhibits no discharge. Left eye exhibits no discharge. No scleral icterus.  Neck: Normal range of motion. Neck supple.  Cardiovascular: Normal rate, regular  rhythm, normal heart sounds and intact distal pulses.   Pulmonary/Chest: Effort normal and breath sounds normal. She has no wheezes. She has no rales. She exhibits no tenderness.  Abdominal: Soft. Bowel sounds are normal. She exhibits no distension and no mass. There is tenderness (diffusely TTP). There is no rebound and no guarding.  Musculoskeletal: Normal range of motion. She exhibits no edema.  Lymphadenopathy:    She has no cervical adenopathy.  Neurological: [name] alert and oriented to person, place, and time.  Skin: Skin is warm and dry.  Nursing note and vitals reviewed.   ED Course  Procedures (including critical care time) Labs Review Labs Reviewed  CBC WITH DIFFERENTIAL/PLATELET  COMPREHENSIVE METABOLIC PANEL  LIPASE, BLOOD  PREGNANCY, URINE  URINALYSIS, ROUTINE W REFLEX MICROSCOPIC (NOT AT [facility])    Imaging Review No results found. I have personally reviewed and evaluated these images and lab results as part of my medical decision-making.    MDM   Final diagnoses:  Constipation, unspecified constipation type  Nausea  Generalized abdominal pain    Pt presents with abdominal pain, nausea and constipation. Pt reports having constipation since mid august. Mild relief with bowel regimen. VSS. Pt diffusely tender on exam with no peritoneal signs. I do not feel that imaging is warranted at this time, pt is  presenting with same sxs she reports having for the past month and on 09/07/15 when a CT abdomen was done (which suggested constipation) and KUB on 09/04/15 showed constipation.   12:30 - Pt reports her nausea has mildly improved. Ordered more pain meds and anti-emetics. Pt reports she has a follow up appointment with GI in October.   1:20 - Pt resting in bed comfortably. Pt reports her pain and nausea have improved. Plan to d/c pt home. Pt reports she has zofran at home and does not need another prescription. Pt advised to continue taking miralax BID and also advised to continue taking stool softeners rx by PCP as needed along with drinking lots of fluids. Pt advised to follow up with PCP on Monday and to call her GI doctor to schedule and appointment for next week.   Evaluation does not show pathology requring ongoing emergent intervention or admission. Pt is hemodynamically stable and mentating appropriately. Discussed findings/results and plan with patient/guardian, who agrees with plan. All questions answered. Return precautions discussed and outpatient follow up given.    Chesley Noon [place], [place] 09/12/15 1433  Pattricia Boss, MD 09/14/15 [phone]

## 2015-09-18 ENCOUNTER — Encounter (HOSPITAL_COMMUNITY)
Admission: RE | Admit: 2015-09-18 | Discharge: 2015-09-18 | Disposition: A | Discharge status: Home / Self Care | Payer: Managed Care, Other (non HMO) | Source: Ambulatory Visit | Attending: Nephrology | Admitting: Nephrology

## 2015-09-18 DIAGNOSIS — N182 Chronic kidney disease, stage 2 (mild): Secondary | ICD-10-CM | POA: Diagnosis not present

## 2015-09-18 LAB — POCT HEMOGLOBIN-HEMACUE: HEMOGLOBIN: 10.4 g/dL — AB (ref 12.0–15.0)

## 2015-09-18 MED ORDER — EPOETIN ALFA 40000 UNIT/ML IJ SOLN: 30000.0000 [IU] | INTRAMUSCULAR | Status: DC

## 2015-09-18 MED ORDER — SODIUM CHLORIDE 0.9 % IV SOLN
510.0000 mg | Freq: Once | INTRAVENOUS | Status: AC
  Administered 2015-09-18: 510 mg via INTRAVENOUS
  Filled 2015-09-18: qty 17

## 2015-09-18 MED ORDER — EPOETIN ALFA 10000 UNIT/ML IJ SOLN
INTRAMUSCULAR | Status: AC
  Administered 2015-09-18: 10000 [IU]
  Filled 2015-09-18: qty 1

## 2015-09-18 MED ORDER — EPOETIN ALFA 20000 UNIT/ML IJ SOLN
INTRAMUSCULAR | Status: AC
  Administered 2015-09-18: 20000 [IU] via SUBCUTANEOUS
  Filled 2015-09-18: qty 1

## 2015-09-26 ENCOUNTER — Telehealth: Payer: Self-pay | Admitting: Family Medicine

## 2015-09-26 ENCOUNTER — Other Ambulatory Visit: Payer: Self-pay | Admitting: Family Medicine

## 2015-09-26 MED ORDER — POLYETHYLENE GLYCOL 3350 17 GM/SCOOP PO POWD: 17.0000 g | Freq: Two times a day (BID) | ORAL | Status: DC

## 2015-09-26 NOTE — Telephone Encounter (Signed)
Printed miralax script for her to pick up at her request.

## 2015-09-26 NOTE — Telephone Encounter (Signed)
Pt aware.  Rx faxed.

## 2015-09-26 NOTE — Telephone Encounter (Signed)
Pt would like nurse to call her about refills on medications.

## 2015-09-26 NOTE — Telephone Encounter (Signed)
Pt is requesting you write Rx for miralax so she can use her health spending account to pay for it.  Please advise.

## 2015-09-29 ENCOUNTER — Encounter: Payer: Self-pay | Admitting: Family Medicine

## 2015-09-29 DIAGNOSIS — E113599 Type 2 diabetes mellitus with proliferative diabetic retinopathy without macular edema, unspecified eye: Secondary | ICD-10-CM | POA: Insufficient documentation

## 2015-09-29 HISTORY — DX: Type 2 diabetes mellitus with proliferative diabetic retinopathy without macular edema, unspecified eye: E11.3599

## 2015-10-03 ENCOUNTER — Encounter (HOSPITAL_COMMUNITY)
Admission: RE | Admit: 2015-10-03 | Discharge: 2015-10-03 | Disposition: A | Discharge status: Home / Self Care | Payer: Managed Care, Other (non HMO) | Source: Ambulatory Visit | Attending: Nephrology | Admitting: Nephrology

## 2015-10-03 DIAGNOSIS — N182 Chronic kidney disease, stage 2 (mild): Secondary | ICD-10-CM | POA: Diagnosis present

## 2015-10-03 LAB — IRON AND TIBC
Iron: 64 ug/dL (ref 28–170)
SATURATION RATIOS: 25 % (ref 10.4–31.8)
TIBC: 253 ug/dL (ref 250–450)
UIBC: 189 ug/dL

## 2015-10-03 LAB — FERRITIN: Ferritin: 684 ng/mL — ABNORMAL HIGH (ref 11–307)

## 2015-10-03 LAB — POCT HEMOGLOBIN-HEMACUE: HEMOGLOBIN: 11.3 g/dL — AB (ref 12.0–15.0)

## 2015-10-03 MED ORDER — EPOETIN ALFA 40000 UNIT/ML IJ SOLN: 30000.0000 [IU] | INTRAMUSCULAR | Status: DC

## 2015-10-03 MED ORDER — EPOETIN ALFA 20000 UNIT/ML IJ SOLN
INTRAMUSCULAR | Status: AC
  Administered 2015-10-03: 20000 [IU] via SUBCUTANEOUS
  Filled 2015-10-03: qty 1

## 2015-10-03 MED ORDER — EPOETIN ALFA 10000 UNIT/ML IJ SOLN
INTRAMUSCULAR | Status: AC
  Administered 2015-10-03: 10000 [IU] via SUBCUTANEOUS
  Filled 2015-10-03: qty 1

## 2015-10-06 ENCOUNTER — Other Ambulatory Visit: Payer: Self-pay | Admitting: Family Medicine

## 2015-10-06 NOTE — Telephone Encounter (Signed)
RF request for gabapentin.   Last RX was 07/17/15 x 3 rfs Last OV 09/10/2015  Please advise.

## 2015-10-07 MED ORDER — GABAPENTIN 300 MG PO CAPS: 300.0000 mg | ORAL_CAPSULE | Freq: Two times a day (BID) | ORAL | Status: DC | PRN

## 2015-10-16 ENCOUNTER — Other Ambulatory Visit (HOSPITAL_COMMUNITY): Payer: Self-pay | Admitting: *Deleted

## 2015-10-17 ENCOUNTER — Encounter (HOSPITAL_COMMUNITY)
Admission: RE | Admit: 2015-10-17 | Discharge: 2015-10-17 | Disposition: A | Discharge status: Home / Self Care | Payer: Managed Care, Other (non HMO) | Source: Ambulatory Visit | Attending: Nephrology | Admitting: Nephrology

## 2015-10-17 DIAGNOSIS — N182 Chronic kidney disease, stage 2 (mild): Secondary | ICD-10-CM | POA: Diagnosis not present

## 2015-10-17 MED ORDER — EPOETIN ALFA 40000 UNIT/ML IJ SOLN: 30000.0000 [IU] | INTRAMUSCULAR | Status: DC

## 2015-10-17 MED ORDER — SODIUM CHLORIDE 0.9 % IV SOLN
510.0000 mg | Freq: Once | INTRAVENOUS | Status: AC
  Administered 2015-10-17: 510 mg via INTRAVENOUS
  Filled 2015-10-17: qty 17

## 2015-10-20 LAB — POCT HEMOGLOBIN-HEMACUE: Hemoglobin: 12 g/dL (ref 12.0–15.0)

## 2015-10-21 ENCOUNTER — Ambulatory Visit (INDEPENDENT_AMBULATORY_CARE_PROVIDER_SITE_OTHER): Payer: Managed Care, Other (non HMO) | Admitting: Gastroenterology

## 2015-10-21 ENCOUNTER — Encounter: Payer: Self-pay | Admitting: Gastroenterology

## 2015-10-21 VITALS — BP 118/78 | HR 78 | Ht 69.0 in | Wt 238.0 lb

## 2015-10-21 DIAGNOSIS — Z1211 Encounter for screening for malignant neoplasm of colon: Secondary | ICD-10-CM | POA: Diagnosis not present

## 2015-10-21 DIAGNOSIS — K3184 Gastroparesis: Secondary | ICD-10-CM | POA: Diagnosis not present

## 2015-10-21 DIAGNOSIS — K5900 Constipation, unspecified: Secondary | ICD-10-CM

## 2015-10-21 MED ORDER — LINACLOTIDE 290 MCG PO CAPS: 290.0000 ug | ORAL_CAPSULE | Freq: Every day | ORAL | Status: DC

## 2015-10-21 MED ORDER — NA SULFATE-K SULFATE-MG SULF 17.5-3.13-1.6 GM/177ML PO SOLN: ORAL | Status: DC

## 2015-10-21 NOTE — Progress Notes (Signed)
HPI :  44 y/o female, previously seen by Dr. Olevia Perches, last in 2012, here for reassessment of her chronic GI issues. The patient has a history of gastroparesis and constipation.   Patient reports ongoing constipation. If she does not take anything it can be up to weeks in between bowel movement, longest one month at a time in between BMs. She does not have the urge to have a bowel movement. She denies use of manual maneuvers to help push out stools. No blood in the stools. She feels bloated and distensed if she does not have a BM. She has used miralax in the past, she has used up to twice per day without benefit. She has been on suppositories and senna which has not provided much relief. She has used fleet enemas in the past. She reports she has had symptoms for years and they are stable. She has never been on Linzess or Amitiza. She has had drank bowel preparatoin in the past which has provided some stool output and provided relief.  She has never had a prior colonoscopy. No FH of colon cancer. Mother has had colon polyps.   She also has a history of gastroparesis thought to be related to DM. She feels full easily when eating at time. She has occasional vomiting, but not significant or common. She also has some nausea. These symptoms are stable over time. [name] generally able to eat okay, not losing weight, she is gaining weight.   Prior workup: EGD 2012 was normal CT abdomen 09/07/2015 - R ovarian cyst, otherwise normal Gastric emptying study 2012 c/w delayed gastric emptying  Past Medical History  Diagnosis Date  . Gastroparesis   . Anemia   . IBS (irritable bowel syndrome)   . Nausea   . Hx: UTI (urinary tract infection)   . Fibroids     leiomyoma 8/04 per [name]  . Fibromyalgia   . Diabetes mellitus   . Hypertension   . Chronic kidney disease   . Allergy   . Blood transfusion without reported diagnosis   . Infertility     eval at [facility]  . Obesity   . Pancreatitis      Past  Surgical History  Procedure Laterality Date  . Eye surgery      bilateral, vitrectomies  . Fibroid tumors removed    . Foot surgery      bilateral   Family History  Problem Relation Age of Onset  . Colon polyps Mother   . Diabetes Mother   . Heart murmur Father   . Hypertension Father   . Diabetes Sister   . Kidney failure Sister   . Diabetes Brother   . Retinal degeneration Brother   . Heart murmur Son   . Stroke Paternal Grandmother   . Diabetes Sister    Social History  Substance Use Topics  . Smoking status: Never Smoker   . Smokeless tobacco: Never Used  . Alcohol Use: No   Current Outpatient Prescriptions  Medication Sig Dispense Refill  . amLODipine (NORVASC) 5 MG tablet Take 1 tablet (5 mg total) by mouth daily. 90 tablet 1  . atorvastatin (LIPITOR) 10 MG tablet Take 1 tablet (10 mg total) by mouth daily. (Patient not taking: Reported on 07/17/2015) 90 tablet 0  . b complex vitamins tablet Take 1 tablet by mouth daily. (Patient not taking: Reported on 09/03/2015) 90 tablet 1  . bisacodyl (DULCOLAX) 10 MG suppository Place 1 suppository (10 mg total) rectally as needed  for moderate constipation. (Patient not taking: Reported on 09/07/2015) 12 suppository 0  . calcitRIOL (ROCALTROL) 0.25 MCG capsule Take 0.5 mcg by mouth at bedtime.     [name] eletriptan (RELPAX) 40 MG tablet Take 1 tablet (40 mg total) by mouth once. Repeat once, in 2h, if still have headache. Do not take more than 2 tablets in 24h. 10 tablet 2  . epoetin alfa (EPOGEN,PROCRIT) 60454 UNIT/ML injection Inject 30,000 Units into the vein every 14 (fourteen) days.     . ferumoxytol (FERAHEME) 510 MG/17ML SOLN injection Inject 1,020 mg into the vein every 14 (fourteen) days.     . furosemide (LASIX) 80 MG tablet Take 1 tablet (80 mg total) by mouth 2 (two) times daily. (Patient taking differently: Take 160 mg by mouth daily as needed for fluid. ) 60 tablet 0  . gabapentin (NEURONTIN) 300 MG capsule Take 1 capsule (300  mg total) by mouth 2 (two) times daily as needed. 60 capsule 3  . glucose blood (ONETOUCH VERIO) test strip Test 4 times daily. (Patient taking differently: 1 each by Other route 3 (three) times daily. ) 100 each 12  . insulin detemir (LEVEMIR) 100 UNIT/ML injection Inject 0.19 mLs (19 Units total) into the skin daily. Inject at lunch time. (Patient taking differently: Inject 19 Units into the skin at bedtime. ) 10 mL 3  . insulin lispro (HUMALOG) 100 UNIT/ML injection Inject 0.1 mLs (10 Units total) into the skin 3 (three) times daily before meals. 10 mL 6  . lisinopril (PRINIVIL,ZESTRIL) 40 MG tablet Take 1 tablet (40 mg total) by mouth daily. 90 tablet 1  . polyethylene glycol powder (GLYCOLAX/MIRALAX) powder Take 17 g by mouth 2 times daily at 12 noon and 4 pm. 3350 g 1   No current facility-administered medications for this visit.   Allergies  Allergen Reactions  . Ibuprofen Other (See Comments)    CKD stage 3. Should avoid.  . Dilaudid [Hydromorphone Hcl] Itching    Can take with benadryl     Review of Systems: All systems reviewed and negative except where noted in HPI.   Lab Results  Component Value Date   WBC 6.8 09/12/2015   HGB 12.0 10/17/2015   HCT 36.4 09/12/2015   MCV 91.2 09/12/2015   PLT 253 09/12/2015    Lab Results  Component Value Date   CREATININE 4.36* 09/12/2015   BUN 37* 09/12/2015   NA 134* 09/12/2015   K 4.7 09/12/2015   CL 102 09/12/2015   CO2 24 09/12/2015    Lab Results  Component Value Date   ALT 20 09/12/2015   AST 20 09/12/2015   ALKPHOS 87 09/12/2015   BILITOT 0.6 09/12/2015     Physical Exam: Ht 5\' 9"  (1.753 m)  Wt 238 lb (107.956 kg)  BMI 35.13 kg/m2 Constitutional: Pleasant,well-developed, female in no acute distress. HEENT: Normocephalic and atraumatic. Conjunctivae are normal. No scleral icterus. Neck supple.  Cardiovascular: Normal rate, regular rhythm.  Pulmonary/chest: Effort normal and breath sounds normal. No wheezing,  rales or rhonchi. Abdominal: Soft, protuberant, nontender. Bowel sounds active throughout. There are no masses palpable. No hepatomegaly. Extremities: no edema Lymphadenopathy: No cervical adenopathy noted. Neurological: Alert and oriented to person place and time. Skin: Skin is warm and dry. No rashes noted. Psychiatric: Normal mood and affect. Behavior is normal.   ASSESSMENT AND PLAN: 44 y/o [place] American female with longstanding severe constipation, refractory to many regimens in the past, with ongoing severe constipation. Suspect slow transit although  outlet dysfunction is possible. Will try her on Linzess 215mcg per day to see if she has any benefit with this. No dosing change is needed in light of renal insufficiency. We otherwise discussed CRC screening as she is due for this at present time. She wishes to proceed with optical colonoscopy following discussion as outlined below. Recommend a 2 day preparation in light of severe constipation, and hopefully Linzess will help with her preparation in the interim with treatment of constipation.   Regarding her history of gastroparesis, her symptoms from this appear mild and stable at present time. She wishes to see how she responds to Glandorf first, may consider trial of Reglan in the future pending her course.   The indications, risks, and benefits of colonoscopy were explained to the patient in detail. Risks include but are not limited to bleeding, perforation, adverse reaction to medications, and cardiopulmonary compromise. Sequelae include but are not limited to the possibility of surgery, hospitalization, and mortality. The patient verbalized understanding and wished to proceed. All questions answered, referred to the scheduler and bowel prep ordered. Further recommendations pending results of the exam.   Harvey Cellar, MD [facility] Pager [phone]

## 2015-10-21 NOTE — Patient Instructions (Signed)
You have been scheduled for a colonoscopy. Please follow written instructions given to you at your visit today.  Please pick up your prep supplies at the pharmacy within the next 1-3 days. If you use inhalers (even only as needed), please bring them with you on the day of your procedure. Your physician has requested that you go to www.startemmi.com and enter the access code given to you at your visit today. This web site gives a general overview about your procedure. However, you should still follow specific instructions given to you by our office regarding your preparation for the procedure.  We have sent the following medications to your pharmacy for you to pick up at your convenience: Linzess, [employer]

## 2015-10-31 ENCOUNTER — Encounter (HOSPITAL_COMMUNITY)
Admission: RE | Admit: 2015-10-31 | Discharge: 2015-10-31 | Disposition: A | Discharge status: Home / Self Care | Payer: Managed Care, Other (non HMO) | Source: Ambulatory Visit | Attending: Nephrology | Admitting: Nephrology

## 2015-10-31 DIAGNOSIS — N182 Chronic kidney disease, stage 2 (mild): Secondary | ICD-10-CM | POA: Diagnosis present

## 2015-10-31 LAB — IRON AND TIBC
Iron: 79 ug/dL (ref 28–170)
SATURATION RATIOS: 35 % — AB (ref 10.4–31.8)
TIBC: 228 ug/dL — AB (ref 250–450)
UIBC: 149 ug/dL

## 2015-10-31 LAB — POCT HEMOGLOBIN-HEMACUE: HEMOGLOBIN: 10.8 g/dL — AB (ref 12.0–15.0)

## 2015-10-31 LAB — FERRITIN: FERRITIN: 871 ng/mL — AB (ref 11–307)

## 2015-10-31 MED ORDER — EPOETIN ALFA 20000 UNIT/ML IJ SOLN
INTRAMUSCULAR | Status: AC
  Administered 2015-10-31: 20000 [IU] via SUBCUTANEOUS
  Filled 2015-10-31: qty 1

## 2015-10-31 MED ORDER — EPOETIN ALFA 10000 UNIT/ML IJ SOLN
INTRAMUSCULAR | Status: AC
  Administered 2015-10-31: 10000 [IU] via SUBCUTANEOUS
  Filled 2015-10-31: qty 1

## 2015-10-31 MED ORDER — EPOETIN ALFA 40000 UNIT/ML IJ SOLN: 30000.0000 [IU] | INTRAMUSCULAR | Status: DC

## 2015-11-14 ENCOUNTER — Encounter (HOSPITAL_COMMUNITY)
Admission: RE | Admit: 2015-11-14 | Discharge: 2015-11-14 | Disposition: A | Discharge status: Home / Self Care | Payer: Managed Care, Other (non HMO) | Source: Ambulatory Visit | Attending: Nephrology | Admitting: Nephrology

## 2015-11-14 DIAGNOSIS — N182 Chronic kidney disease, stage 2 (mild): Secondary | ICD-10-CM | POA: Diagnosis not present

## 2015-11-14 MED ORDER — EPOETIN ALFA 10000 UNIT/ML IJ SOLN
INTRAMUSCULAR | Status: AC
  Administered 2015-11-14: 10000 [IU] via SUBCUTANEOUS
  Filled 2015-11-14: qty 1

## 2015-11-14 MED ORDER — EPOETIN ALFA 20000 UNIT/ML IJ SOLN
INTRAMUSCULAR | Status: AC
  Administered 2015-11-14: 20000 [IU] via SUBCUTANEOUS
  Filled 2015-11-14: qty 1

## 2015-11-14 MED ORDER — EPOETIN ALFA 40000 UNIT/ML IJ SOLN: 30000.0000 [IU] | INTRAMUSCULAR | Status: DC

## 2015-11-17 LAB — POCT HEMOGLOBIN-HEMACUE: Hemoglobin: 11.5 g/dL — ABNORMAL LOW (ref 12.0–15.0)

## 2015-12-04 ENCOUNTER — Encounter (HOSPITAL_COMMUNITY): Payer: Managed Care, Other (non HMO)

## 2015-12-05 ENCOUNTER — Encounter (HOSPITAL_COMMUNITY)
Admission: RE | Admit: 2015-12-05 | Discharge: 2015-12-05 | Disposition: A | Discharge status: Home / Self Care | Payer: Managed Care, Other (non HMO) | Source: Ambulatory Visit | Attending: Nephrology | Admitting: Nephrology

## 2015-12-05 DIAGNOSIS — N182 Chronic kidney disease, stage 2 (mild): Secondary | ICD-10-CM | POA: Insufficient documentation

## 2015-12-05 LAB — IRON AND TIBC
Iron: 64 ug/dL (ref 28–170)
Saturation Ratios: 29 % (ref 10.4–31.8)
TIBC: 217 ug/dL — AB (ref 250–450)
UIBC: 153 ug/dL

## 2015-12-05 LAB — POCT HEMOGLOBIN-HEMACUE: HEMOGLOBIN: 12 g/dL (ref 12.0–15.0)

## 2015-12-05 LAB — FERRITIN: FERRITIN: 647 ng/mL — AB (ref 11–307)

## 2015-12-05 MED ORDER — EPOETIN ALFA 40000 UNIT/ML IJ SOLN: 30000.0000 [IU] | INTRAMUSCULAR | Status: DC

## 2015-12-08 ENCOUNTER — Ambulatory Visit (INDEPENDENT_AMBULATORY_CARE_PROVIDER_SITE_OTHER): Payer: Managed Care, Other (non HMO) | Admitting: Family Medicine

## 2015-12-08 ENCOUNTER — Encounter: Payer: Self-pay | Admitting: Family Medicine

## 2015-12-08 VITALS — BP 128/84 | HR 87 | Temp 97.7°F | Resp 20 | Wt 227.8 lb

## 2015-12-08 DIAGNOSIS — H109 Unspecified conjunctivitis: Secondary | ICD-10-CM | POA: Insufficient documentation

## 2015-12-08 DIAGNOSIS — J0100 Acute maxillary sinusitis, unspecified: Secondary | ICD-10-CM

## 2015-12-08 DIAGNOSIS — M545 Low back pain, unspecified: Secondary | ICD-10-CM

## 2015-12-08 DIAGNOSIS — J320 Chronic maxillary sinusitis: Secondary | ICD-10-CM | POA: Insufficient documentation

## 2015-12-08 DIAGNOSIS — N898 Other specified noninflammatory disorders of vagina: Secondary | ICD-10-CM | POA: Diagnosis not present

## 2015-12-08 HISTORY — DX: Low back pain, unspecified: M54.50

## 2015-12-08 HISTORY — DX: Low back pain: M54.5

## 2015-12-08 MED ORDER — DOXYCYCLINE HYCLATE 100 MG PO TABS: 100.0000 mg | ORAL_TABLET | Freq: Two times a day (BID) | ORAL | Status: DC

## 2015-12-08 MED ORDER — CYCLOBENZAPRINE HCL 5 MG PO TABS: 5.0000 mg | ORAL_TABLET | Freq: Three times a day (TID) | ORAL | Status: DC | PRN

## 2015-12-08 MED ORDER — NEOMYCIN-POLYMYXIN-HC 3.5-10000-1 OP SUSP: 3.0000 [drp] | Freq: Four times a day (QID) | OPHTHALMIC | Status: DC

## 2015-12-08 MED ORDER — FLUTICASONE PROPIONATE 50 MCG/ACT NA SUSP: 2.0000 | Freq: Every day | NASAL | Status: DC

## 2015-12-08 MED ORDER — FLUCONAZOLE 150 MG PO TABS: 150.0000 mg | ORAL_TABLET | Freq: Once | ORAL | Status: DC

## 2015-12-08 NOTE — Patient Instructions (Signed)
I have called in an antibiotic for your cough/sinus infection as well as flonase to use daily. Eye drops as well.  Use mucinex to help decrease the secretions.  Diflucan called in for vaginal irritation, no improvement after using, will need to do pelvic exam.  Flexeril called in for back pain, use heat and massage as well. If no improvement in 2-4 weeks will need to see you again.

## 2015-12-08 NOTE — Progress Notes (Signed)
Subjective:    Patient ID: Heather [name], female    DOB: 1971/03/29, 44 y.o.   MRN: [id number]  HPI  Cough: Patient presents for a 1-1/2 week of headache, rhinorrhea, congestion and decreased energy. Patient states she has had some mild fevers and chills. She has had pneumonia in the past, but does not feel like these are pneumonia symptoms. He denies any wheezing or shortness of breath. He is eating and drinking okay. He endorses a dry cough in right eye discomfort. She states it feels like there is a film over her right eye and is mildly red. [name] an uncontrolled diabetic, with end-stage renal disease.    Back pain: Patient states she felt work approximately one month ago, and she is now having right lower back pain. She points over to her sacroiliac joint. She states when she fell she thought she hit the center of her back, and she does not feel that her discomfort today is related to her fall. She states it hurts when she moves or lays flat on her back. She states the pain is better when she sits still and does not move. She denies any bruising, redness. She denies any changes in urination dysuria or frequency.   Vaginal discharge: Patient endorses a clear milky discharge approximate 2 weeks with some vaginal irritation. She states it's intermittent in nature. She does have uncontrolled diabetes, frequent yeast infections. [name] married, in a monogamous relationship .Review of Systems Negative, with the exception of above mentioned in HPI      Objective:   Physical Exam BP 128/84 mmHg  Pulse 87  Temp(Src) 97.7 F (36.5 C) (Oral)  Resp 20  Wt 227 lb 12 oz (103.307 kg)  SpO2 98%  LMP 12/01/2015 Gen: Afebrile. No acute distress. Nontoxic in appearance, well-developed, well-nourished, obese African-American female. Pleasant. HENT: AT. [place]. Bilateral TM visualized shiny/full/air-fluid level. MMM, no oral lesions. Bilateral nares erythema, swelling and drainage. Throat without erythema  or exudates. Cobblestoning present, cough present, hoarseness present. Eyes:Pupils Equal Round Reactive to light, Extraocular movements intact,  Conjunctiva with redness right eye only, no discharge or icterus. Neck/lymp/endocrine: Supple, anterior cervical lymphadenopathy CV: RRR  Chest: CTAB, no wheeze or crackles. Mildly diminished air movement. Good respiratory effort. Abd: Soft. NTND. BS present. No Masses palpated.  Skin: No rashes, purpura or petechiae.  Neuro/msk: Slow, guarded gait. PERLA. EOMi. Alert. Orientedx3. Cranial nerves II through XII intact. Muscle strength 5/5 upper and lower extremity. No bony tenderness thoracic or lumbar spine. No step-offs. Tender to palpation right SI joint. Lower thoracic and lumbar paraspinal fullness with tenderness, right. Positive Fabre right for SI. DTRs equal bilaterally. GU: deferred by pt      Assessment & Plan:  1. Acute maxillary sinusitis, recurrence not specified - Stage IV kidney disease and uncontrolled diabetes. Patient with signs and symptoms of maxillary sinusitis today on exam. Will start doxycycline 10 day course. Patient encouraged to start Flonase daily. Rest and hydration. - doxycycline (VIBRA-TABS) 100 MG tablet; Take 1 tablet (100 mg total) by mouth 2 (two) times daily.  Dispense: 20 tablet; Refill: 0 - fluticasone (FLONASE) 50 MCG/ACT nasal spray; Place 2 sprays into both nostrils daily.  Dispense: 16 g; Refill: 6  2. Vaginal irritation - Issue with uncontrolled diabetes, complaining of vaginal irritation. Will prescribe Diflucan. Patient encouraged that if this does not resolve her symptoms with one week we will need to do a pelvic. - fluconazole (DIFLUCAN) 150 MG tablet; Take 1 tablet (  150 mg total) by mouth once.  Dispense: 1 tablet; Refill: 0  3. Right-sided low back pain without sciatica - Patient with right SI joint tenderness on exam today. Positive [place]. Unable to treat with NSAIDs or steroids secondary to kidney  function and uncontrolled diabetes. - Encourage patient to use muscle relaxer, heat therapy, massage. Return in 2-4 weeks if no improvement. - cyclobenzaprine (FLEXERIL) 5 MG tablet; Take 1 tablet (5 mg total) by mouth 3 (three) times daily as needed for muscle spasms.  Dispense: 30 tablet; Refill: 1  4. Conjunctivitis of right eye - neomycin-polymyxin-hydrocortisone (CORTISPORIN) 3.5-10000-1 ophthalmic suspension; Place 3 drops into the right eye 4 (four) times daily.  Dispense: 7.5 mL; Refill: 0  Follow-up one week if no improvement

## 2015-12-18 ENCOUNTER — Other Ambulatory Visit (HOSPITAL_COMMUNITY): Payer: Self-pay | Admitting: *Deleted

## 2015-12-19 ENCOUNTER — Encounter (HOSPITAL_COMMUNITY)
Admission: RE | Admit: 2015-12-19 | Discharge: 2015-12-19 | Disposition: A | Discharge status: Home / Self Care | Payer: Managed Care, Other (non HMO) | Source: Ambulatory Visit | Attending: Nephrology | Admitting: Nephrology

## 2015-12-19 DIAGNOSIS — N182 Chronic kidney disease, stage 2 (mild): Secondary | ICD-10-CM | POA: Diagnosis not present

## 2015-12-19 MED ORDER — EPOETIN ALFA 10000 UNIT/ML IJ SOLN
INTRAMUSCULAR | Status: AC
  Administered 2015-12-19: 10000 [IU] via SUBCUTANEOUS
  Filled 2015-12-19: qty 1

## 2015-12-19 MED ORDER — EPOETIN ALFA 40000 UNIT/ML IJ SOLN: 30000.0000 [IU] | INTRAMUSCULAR | Status: DC

## 2015-12-19 MED ORDER — SODIUM CHLORIDE 0.9 % IV SOLN
510.0000 mg | Freq: Once | INTRAVENOUS | Status: AC
  Administered 2015-12-19: 510 mg via INTRAVENOUS
  Filled 2015-12-19: qty 17

## 2015-12-19 MED ORDER — EPOETIN ALFA 20000 UNIT/ML IJ SOLN
INTRAMUSCULAR | Status: AC
  Administered 2015-12-19: 20000 [IU] via SUBCUTANEOUS
  Filled 2015-12-19: qty 1

## 2015-12-23 LAB — POCT HEMOGLOBIN-HEMACUE: HEMOGLOBIN: 11.5 g/dL — AB (ref 12.0–15.0)

## 2016-01-01 ENCOUNTER — Encounter: Payer: Managed Care, Other (non HMO) | Admitting: Gastroenterology

## 2016-01-01 ENCOUNTER — Other Ambulatory Visit: Payer: Self-pay | Admitting: *Deleted

## 2016-01-01 DIAGNOSIS — I10 Essential (primary) hypertension: Secondary | ICD-10-CM

## 2016-01-01 MED ORDER — LISINOPRIL 40 MG PO TABS: 40.0000 mg | ORAL_TABLET | Freq: Every day | ORAL | Status: DC

## 2016-01-01 NOTE — Telephone Encounter (Signed)
Patient Lisinopril refilled per refill protocol

## 2016-01-02 ENCOUNTER — Encounter (HOSPITAL_COMMUNITY)
Admission: RE | Admit: 2016-01-02 | Discharge: 2016-01-02 | Disposition: A | Discharge status: Home / Self Care | Payer: Managed Care, Other (non HMO) | Source: Ambulatory Visit | Attending: Nephrology | Admitting: Nephrology

## 2016-01-02 DIAGNOSIS — N182 Chronic kidney disease, stage 2 (mild): Secondary | ICD-10-CM | POA: Insufficient documentation

## 2016-01-02 LAB — IRON AND TIBC
Iron: 59 ug/dL (ref 28–170)
Saturation Ratios: 29 % (ref 10.4–31.8)
TIBC: 203 ug/dL — ABNORMAL LOW (ref 250–450)
UIBC: 144 ug/dL

## 2016-01-02 LAB — FERRITIN: Ferritin: 846 ng/mL — ABNORMAL HIGH (ref 11–307)

## 2016-01-02 LAB — POCT HEMOGLOBIN-HEMACUE: HEMOGLOBIN: 11.4 g/dL — AB (ref 12.0–15.0)

## 2016-01-02 MED ORDER — EPOETIN ALFA 10000 UNIT/ML IJ SOLN
INTRAMUSCULAR | Status: AC
  Administered 2016-01-02: 10000 [IU] via SUBCUTANEOUS
  Filled 2016-01-02: qty 1

## 2016-01-02 MED ORDER — EPOETIN ALFA 20000 UNIT/ML IJ SOLN
INTRAMUSCULAR | Status: AC
  Administered 2016-01-02: 20000 [IU] via SUBCUTANEOUS
  Filled 2016-01-02: qty 1

## 2016-01-02 MED ORDER — EPOETIN ALFA 40000 UNIT/ML IJ SOLN: 30000.0000 [IU] | INTRAMUSCULAR | Status: DC

## 2016-01-16 ENCOUNTER — Encounter (HOSPITAL_COMMUNITY)
Admission: RE | Admit: 2016-01-16 | Discharge: 2016-01-16 | Disposition: A | Discharge status: Home / Self Care | Payer: Managed Care, Other (non HMO) | Source: Ambulatory Visit | Attending: Nephrology | Admitting: Nephrology

## 2016-01-16 DIAGNOSIS — N182 Chronic kidney disease, stage 2 (mild): Secondary | ICD-10-CM

## 2016-01-16 LAB — POCT HEMOGLOBIN-HEMACUE: Hemoglobin: 11.9 g/dL — ABNORMAL LOW (ref 12.0–15.0)

## 2016-01-16 MED ORDER — EPOETIN ALFA 40000 UNIT/ML IJ SOLN: 30000.0000 [IU] | INTRAMUSCULAR | Status: DC

## 2016-01-16 MED ORDER — EPOETIN ALFA 20000 UNIT/ML IJ SOLN
INTRAMUSCULAR | Status: AC
  Administered 2016-01-16: 20000 [IU]
  Filled 2016-01-16: qty 1

## 2016-01-16 MED ORDER — EPOETIN ALFA 10000 UNIT/ML IJ SOLN
INTRAMUSCULAR | Status: AC
  Administered 2016-01-16: 10000 [IU]
  Filled 2016-01-16: qty 1

## 2016-01-23 ENCOUNTER — Other Ambulatory Visit (HOSPITAL_COMMUNITY): Payer: Self-pay | Admitting: Obstetrics & Gynecology

## 2016-01-23 DIAGNOSIS — N979 Female infertility, unspecified: Secondary | ICD-10-CM

## 2016-01-25 ENCOUNTER — Other Ambulatory Visit (HOSPITAL_COMMUNITY)
Admission: RE | Admit: 2016-01-25 | Discharge: 2016-01-25 | Disposition: A | Discharge status: Home / Self Care | Payer: Managed Care, Other (non HMO) | Source: Ambulatory Visit | Attending: Obstetrics & Gynecology | Admitting: Obstetrics & Gynecology

## 2016-01-25 DIAGNOSIS — N979 Female infertility, unspecified: Secondary | ICD-10-CM | POA: Diagnosis present

## 2016-01-26 LAB — ESTRADIOL: Estradiol: 37.4 pg/mL

## 2016-01-26 LAB — FOLLICLE STIMULATING HORMONE: FSH: 9 m[IU]/mL

## 2016-01-29 ENCOUNTER — Other Ambulatory Visit (HOSPITAL_COMMUNITY): Payer: Self-pay | Admitting: *Deleted

## 2016-01-30 ENCOUNTER — Encounter (HOSPITAL_COMMUNITY)
Admission: RE | Admit: 2016-01-30 | Discharge: 2016-01-30 | Disposition: A | Discharge status: Home / Self Care | Payer: Managed Care, Other (non HMO) | Source: Ambulatory Visit | Attending: Nephrology | Admitting: Nephrology

## 2016-01-30 ENCOUNTER — Ambulatory Visit (HOSPITAL_COMMUNITY)
Admission: RE | Admit: 2016-01-30 | Discharge: 2016-01-30 | Disposition: A | Discharge status: Home / Self Care | Payer: Managed Care, Other (non HMO) | Source: Ambulatory Visit | Attending: Obstetrics & Gynecology | Admitting: Obstetrics & Gynecology

## 2016-01-30 DIAGNOSIS — N979 Female infertility, unspecified: Secondary | ICD-10-CM | POA: Diagnosis present

## 2016-01-30 DIAGNOSIS — N182 Chronic kidney disease, stage 2 (mild): Secondary | ICD-10-CM | POA: Insufficient documentation

## 2016-01-30 LAB — IRON AND TIBC
Iron: 55 ug/dL (ref 28–170)
SATURATION RATIOS: 23 % (ref 10.4–31.8)
TIBC: 235 ug/dL — AB (ref 250–450)
UIBC: 180 ug/dL

## 2016-01-30 LAB — FERRITIN: FERRITIN: 791 ng/mL — AB (ref 11–307)

## 2016-01-30 MED ORDER — EPOETIN ALFA 40000 UNIT/ML IJ SOLN: 30000.0000 [IU] | INTRAMUSCULAR | Status: DC

## 2016-01-30 MED ORDER — IOHEXOL 300 MG/ML  SOLN
30.0000 mL | Freq: Once | INTRAMUSCULAR | Status: AC | PRN
  Administered 2016-01-30: 30 mL

## 2016-02-02 ENCOUNTER — Ambulatory Visit (HOSPITAL_COMMUNITY): Payer: Managed Care, Other (non HMO)

## 2016-02-02 LAB — POCT HEMOGLOBIN-HEMACUE: Hemoglobin: 12.2 g/dL (ref 12.0–15.0)

## 2016-02-12 ENCOUNTER — Other Ambulatory Visit (HOSPITAL_COMMUNITY): Payer: Self-pay | Admitting: *Deleted

## 2016-02-13 ENCOUNTER — Encounter (HOSPITAL_COMMUNITY)
Admission: RE | Admit: 2016-02-13 | Discharge: 2016-02-13 | Disposition: A | Discharge status: Home / Self Care | Payer: Managed Care, Other (non HMO) | Source: Ambulatory Visit | Attending: Nephrology | Admitting: Nephrology

## 2016-02-13 DIAGNOSIS — N182 Chronic kidney disease, stage 2 (mild): Secondary | ICD-10-CM | POA: Diagnosis not present

## 2016-02-13 LAB — POCT HEMOGLOBIN-HEMACUE: Hemoglobin: 11.7 g/dL — ABNORMAL LOW (ref 12.0–15.0)

## 2016-02-13 MED ORDER — EPOETIN ALFA 20000 UNIT/ML IJ SOLN
INTRAMUSCULAR | Status: AC
  Administered 2016-02-13: 20000 [IU]
  Filled 2016-02-13: qty 1

## 2016-02-13 MED ORDER — EPOETIN ALFA 10000 UNIT/ML IJ SOLN
INTRAMUSCULAR | Status: AC
  Administered 2016-02-13: 10000 [IU]
  Filled 2016-02-13: qty 1

## 2016-02-13 MED ORDER — EPOETIN ALFA 40000 UNIT/ML IJ SOLN: 30000.0000 [IU] | INTRAMUSCULAR | Status: DC

## 2016-02-13 MED ORDER — SODIUM CHLORIDE 0.9 % IV SOLN
510.0000 mg | Freq: Once | INTRAVENOUS | Status: AC
  Administered 2016-02-13: 510 mg via INTRAVENOUS
  Filled 2016-02-13: qty 17

## 2016-02-27 ENCOUNTER — Encounter (HOSPITAL_COMMUNITY)
Admission: RE | Admit: 2016-02-27 | Discharge: 2016-02-27 | Disposition: A | Discharge status: Home / Self Care | Payer: Managed Care, Other (non HMO) | Source: Ambulatory Visit | Attending: Nephrology | Admitting: Nephrology

## 2016-02-27 DIAGNOSIS — N182 Chronic kidney disease, stage 2 (mild): Secondary | ICD-10-CM | POA: Insufficient documentation

## 2016-02-27 LAB — POCT HEMOGLOBIN-HEMACUE: Hemoglobin: 12.1 g/dL (ref 12.0–15.0)

## 2016-02-27 LAB — IRON AND TIBC
IRON: 92 ug/dL (ref 28–170)
Saturation Ratios: 42 % — ABNORMAL HIGH (ref 10.4–31.8)
TIBC: 218 ug/dL — ABNORMAL LOW (ref 250–450)
UIBC: 126 ug/dL

## 2016-02-27 LAB — FERRITIN: FERRITIN: 1170 ng/mL — AB (ref 11–307)

## 2016-02-27 MED ORDER — EPOETIN ALFA 40000 UNIT/ML IJ SOLN: 30000.0000 [IU] | INTRAMUSCULAR | Status: DC

## 2016-03-08 DIAGNOSIS — E1165 Type 2 diabetes mellitus with hyperglycemia: Secondary | ICD-10-CM

## 2016-03-08 DIAGNOSIS — Z794 Long term (current) use of insulin: Secondary | ICD-10-CM

## 2016-03-08 DIAGNOSIS — IMO0002 Reserved for concepts with insufficient information to code with codable children: Secondary | ICD-10-CM | POA: Insufficient documentation

## 2016-03-08 DIAGNOSIS — E1142 Type 2 diabetes mellitus with diabetic polyneuropathy: Secondary | ICD-10-CM | POA: Insufficient documentation

## 2016-03-12 ENCOUNTER — Encounter (HOSPITAL_COMMUNITY)
Admission: RE | Admit: 2016-03-12 | Discharge: 2016-03-12 | Disposition: A | Discharge status: Home / Self Care | Payer: Managed Care, Other (non HMO) | Source: Ambulatory Visit | Attending: Nephrology | Admitting: Nephrology

## 2016-03-12 DIAGNOSIS — N182 Chronic kidney disease, stage 2 (mild): Secondary | ICD-10-CM

## 2016-03-12 LAB — POCT HEMOGLOBIN-HEMACUE: HEMOGLOBIN: 11.2 g/dL — AB (ref 12.0–15.0)

## 2016-03-12 MED ORDER — EPOETIN ALFA 10000 UNIT/ML IJ SOLN
INTRAMUSCULAR | Status: AC
  Administered 2016-03-12: 10000 [IU] via SUBCUTANEOUS
  Filled 2016-03-12: qty 1

## 2016-03-12 MED ORDER — EPOETIN ALFA 20000 UNIT/ML IJ SOLN
INTRAMUSCULAR | Status: AC
  Administered 2016-03-12: 20000 [IU] via SUBCUTANEOUS
  Filled 2016-03-12: qty 1

## 2016-03-12 MED ORDER — EPOETIN ALFA 40000 UNIT/ML IJ SOLN: 30000.0000 [IU] | INTRAMUSCULAR | Status: DC

## 2016-03-17 ENCOUNTER — Other Ambulatory Visit (HOSPITAL_COMMUNITY): Payer: Self-pay | Admitting: Obstetrics & Gynecology

## 2016-03-17 DIAGNOSIS — N979 Female infertility, unspecified: Secondary | ICD-10-CM

## 2016-03-18 ENCOUNTER — Encounter: Payer: Self-pay | Admitting: Family Medicine

## 2016-03-18 ENCOUNTER — Ambulatory Visit (INDEPENDENT_AMBULATORY_CARE_PROVIDER_SITE_OTHER): Payer: Managed Care, Other (non HMO) | Admitting: Family Medicine

## 2016-03-18 VITALS — BP 148/88 | HR 93 | Temp 98.3°F | Resp 20 | Wt 227.5 lb

## 2016-03-18 DIAGNOSIS — H66001 Acute suppurative otitis media without spontaneous rupture of ear drum, right ear: Secondary | ICD-10-CM

## 2016-03-18 DIAGNOSIS — N184 Chronic kidney disease, stage 4 (severe): Secondary | ICD-10-CM | POA: Diagnosis not present

## 2016-03-18 DIAGNOSIS — Z794 Long term (current) use of insulin: Secondary | ICD-10-CM

## 2016-03-18 DIAGNOSIS — H6691 Otitis media, unspecified, right ear: Secondary | ICD-10-CM | POA: Insufficient documentation

## 2016-03-18 DIAGNOSIS — J0100 Acute maxillary sinusitis, unspecified: Secondary | ICD-10-CM

## 2016-03-18 DIAGNOSIS — E118 Type 2 diabetes mellitus with unspecified complications: Secondary | ICD-10-CM | POA: Diagnosis not present

## 2016-03-18 MED ORDER — AMOXICILLIN-POT CLAVULANATE 875-125 MG PO TABS: 1.0000 | ORAL_TABLET | Freq: Two times a day (BID) | ORAL | Status: DC

## 2016-03-18 MED ORDER — AMOXICILLIN 500 MG PO CAPS: 500.0000 mg | ORAL_CAPSULE | Freq: Two times a day (BID) | ORAL | Status: DC

## 2016-03-18 NOTE — Patient Instructions (Signed)
I have called antibiotic Augmentin every 12 hours for 10 days.  Rest, hydrate,monitor sugars.  Flonase, antihistamine of choice.   Otitis Media, Adult Otitis media is redness, soreness, and inflammation of the middle ear. Otitis media may be caused by allergies or, most commonly, by infection. Often it occurs as a complication of the common cold. SIGNS AND SYMPTOMS Symptoms of otitis media may include:  Earache.  Fever.  Ringing in your ear.  Headache.  Leakage of fluid from the ear. DIAGNOSIS To diagnose otitis media, your health care provider will examine your ear with an otoscope. This is an instrument that allows your health care provider to see into your ear in order to examine your eardrum. Your health care provider also will ask you questions about your symptoms. TREATMENT  Typically, otitis media resolves on its own within 3-5 days. Your health care provider may prescribe medicine to ease your symptoms of pain. If otitis media does not resolve within 5 days or is recurrent, your health care provider may prescribe antibiotic medicines if he or she suspects that a bacterial infection is the cause. HOME CARE INSTRUCTIONS   If you were prescribed an antibiotic medicine, finish it all even if you start to feel better.  Take medicines only as directed by your health care provider.  Keep all follow-up visits as directed by your health care provider. SEEK MEDICAL CARE IF:  You have otitis media only in one ear, or bleeding from your nose, or both.  You notice a lump on your neck.  You are not getting better in 3-5 days.  You feel worse instead of better. SEEK IMMEDIATE MEDICAL CARE IF:   You have pain that is not controlled with medicine.  You have swelling, redness, or pain around your ear or stiffness in your neck.  You notice that part of your face is paralyzed.  You notice that the bone behind your ear (mastoid) is tender when you touch it. MAKE SURE YOU:    Understand these instructions.  Will watch your condition.  Will get help right away if you are not doing well or get worse.   This information is not intended to replace advice given to you by your health care provider. Make sure you discuss any questions you have with your health care provider.   Document Released: 09/17/2004 Document Revised: 01/03/2015 Document Reviewed: 07/10/2013 Elsevier Interactive Patient Education 2016 Elsevier Inc.  Sinusitis, Adult Sinusitis is redness, soreness, and inflammation of the paranasal sinuses. Paranasal sinuses are air pockets within the bones of your face. They are located beneath your eyes, in the middle of your forehead, and above your eyes. In healthy paranasal sinuses, mucus is able to drain out, and air is able to circulate through them by way of your nose. However, when your paranasal sinuses are inflamed, mucus and air can become trapped. This can allow bacteria and other germs to grow and cause infection. Sinusitis can develop quickly and last only a short time (acute) or continue over a long period (chronic). Sinusitis that lasts for more than 12 weeks is considered chronic. CAUSES Causes of sinusitis include:  Allergies.  Structural abnormalities, such as displacement of the cartilage that separates your nostrils (deviated septum), which can decrease the air flow through your nose and sinuses and affect sinus drainage.  Functional abnormalities, such as when the small hairs (cilia) that line your sinuses and help remove mucus do not work properly or are not present. SIGNS AND SYMPTOMS Symptoms of  acute and chronic sinusitis are the same. The primary symptoms are pain and pressure around the affected sinuses. Other symptoms include:  Upper toothache.  Earache.  Headache.  Bad breath.  Decreased sense of smell and taste.  A cough, which worsens when you are lying flat.  Fatigue.  Fever.  Thick drainage from your nose,  which often is green and may contain pus (purulent).  Swelling and warmth over the affected sinuses. DIAGNOSIS Your health care provider will perform a physical exam. During your exam, your health care provider may perform any of the following to help determine if you have acute sinusitis or chronic sinusitis:  Look in your nose for signs of abnormal growths in your nostrils (nasal polyps).  Tap over the affected sinus to check for signs of infection.  View the inside of your sinuses using an imaging device that has a light attached (endoscope). If your health care provider suspects that you have chronic sinusitis, one or more of the following tests may be recommended:  Allergy tests.  Nasal culture. A sample of mucus is taken from your nose, sent to a lab, and screened for bacteria.  Nasal cytology. A sample of mucus is taken from your nose and examined by your health care provider to determine if your sinusitis is related to an allergy. TREATMENT Most cases of acute sinusitis are related to a viral infection and will resolve on their own within 10 days. Sometimes, medicines are prescribed to help relieve symptoms of both acute and chronic sinusitis. These may include pain medicines, decongestants, nasal steroid sprays, or saline sprays. However, for sinusitis related to a bacterial infection, your health care provider will prescribe antibiotic medicines. These are medicines that will help kill the bacteria causing the infection. Rarely, sinusitis is caused by a fungal infection. In these cases, your health care provider will prescribe antifungal medicine. For some cases of chronic sinusitis, surgery is needed. Generally, these are cases in which sinusitis recurs more than 3 times per year, despite other treatments. HOME CARE INSTRUCTIONS  Drink plenty of water. Water helps thin the mucus so your sinuses can drain more easily.  Use a humidifier.  Inhale steam 3-4 times a day (for  example, sit in the bathroom with the shower running).  Apply a warm, moist washcloth to your face 3-4 times a day, or as directed by your health care provider.  Use saline nasal sprays to help moisten and clean your sinuses.  Take medicines only as directed by your health care provider.  If you were prescribed either an antibiotic or antifungal medicine, finish it all even if you start to feel better. SEEK IMMEDIATE MEDICAL CARE IF:  You have increasing pain or severe headaches.  You have nausea, vomiting, or drowsiness.  You have swelling around your face.  You have vision problems.  You have a stiff neck.  You have difficulty breathing.   This information is not intended to replace advice given to you by your health care provider. Make sure you discuss any questions you have with your health care provider.   Document Released: 12/13/2005 Document Revised: 01/03/2015 Document Reviewed: 12/28/2011 Elsevier Interactive Patient Education [employer].

## 2016-03-18 NOTE — Progress Notes (Signed)
Patient ID: Heather [name], female   DOB: Sep 05, 1971, 45 y.o.   MRN: [id number]    Heather [name] , 1971/07/11, 45 y.o., female MRN: [id number]  CC: ear pain  Subjective: Pt presents for an acute OV with complaints of right ear pain  of 1.5 week  duration. Associated symptoms include cough, sneeze, headache, rhinorrhea, chills, nausea. Pt  Denies fever, vomit or diarrhea.Pt has tried flonase, allergy medicine, coricidin to ease their symptoms. Ear pain has remained despite efforts. Pt is a poorly controlled diabetic with CKD4, last GFR 19 (01/2016), reviewed in nephrology note. tdap UTD, PSV23 (2015)  Allergies  Allergen Reactions  . Ibuprofen Other (See Comments)    CKD stage 3. Should avoid.  . Dilaudid [Hydromorphone Hcl] Itching    Can take with benadryl   Social History  Substance Use Topics  . Smoking status: Never Smoker   . Smokeless tobacco: Never Used  . Alcohol Use: No   Past Medical History  Diagnosis Date  . Gastroparesis   . Anemia   . IBS (irritable bowel syndrome)   . Nausea   . Hx: UTI (urinary tract infection)   . Fibroids     leiomyoma 8/04 per [name]  . Fibromyalgia   . Diabetes mellitus   . Hypertension   . Chronic kidney disease   . Allergy   . Blood transfusion without reported diagnosis   . Infertility     eval at [facility]  . Obesity   . Pancreatitis    Past Surgical History  Procedure Laterality Date  . Eye surgery      bilateral, vitrectomies  . Fibroid tumors removed    . Foot surgery      bilateral   Family History  Problem Relation Age of Onset  . Colon polyps Mother   . Diabetes Mother   . Heart murmur Father   . Hypertension Father   . Diabetes Sister   . Kidney failure Sister   . Diabetes Brother   . Retinal degeneration Brother   . Heart murmur Son   . Stroke Paternal Grandmother   . Diabetes Sister      Medication List       This list is accurate as of: 03/18/16 12:04 PM.  Always use your most recent med list.              amLODipine 10 MG tablet  Commonly known as:  NORVASC  Take by mouth.     calcitRIOL 0.25 MCG capsule  Commonly known as:  ROCALTROL  Take 0.5 mcg by mouth at bedtime.     cyclobenzaprine 5 MG tablet  Commonly known as:  FLEXERIL  Take 1 tablet (5 mg total) by mouth 3 (three) times daily as needed for muscle spasms.     eletriptan 40 MG tablet  Commonly known as:  RELPAX  Take 1 tablet (40 mg total) by mouth once. Repeat once, in 2h, if still have headache. Do not take more than 2 tablets in 24h.     epoetin alfa 10000 UNIT/ML injection  Commonly known as:  EPOGEN,PROCRIT  Inject 30,000 Units into the vein every 14 (fourteen) days.     ferumoxytol 510 MG/17ML Soln injection  Commonly known as:  FERAHEME  Inject 1,020 mg into the vein every 14 (fourteen) days.     fluticasone 50 MCG/ACT nasal spray  Commonly known as:  FLONASE  Place 2 sprays into both nostrils daily.     furosemide 80 MG tablet  Commonly known as:  LASIX  Take 1 tablet (80 mg total) by mouth 2 (two) times daily.     gabapentin 300 MG capsule  Commonly known as:  NEURONTIN  Take 1 capsule (300 mg total) by mouth 2 (two) times daily as needed.     glucose blood test strip  Commonly known as:  ONETOUCH VERIO  Test 4 times daily.     insulin detemir 100 UNIT/ML injection  Commonly known as:  LEVEMIR  Inject 0.19 mLs (19 Units total) into the skin daily. Inject at lunch time.     insulin lispro 100 UNIT/ML injection  Commonly known as:  HUMALOG  Inject 0.1 mLs (10 Units total) into the skin 3 (three) times daily before meals.     Linaclotide 290 MCG Caps capsule  Commonly known as:  LINZESS  Take 1 capsule (290 mcg total) by mouth daily.     LIPITOR 40 MG tablet  Generic drug:  atorvastatin  Take by mouth.     lisinopril 40 MG tablet  Commonly known as:  PRINIVIL,ZESTRIL  Take 1 tablet (40 mg total) by mouth daily.         ROS: Negative, with the exception of above mentioned in  HPI   Objective:  BP 148/88 mmHg  Pulse 93  Temp(Src) 98.3 F (36.8 C)  Resp 20  Wt 227 lb 8 oz (103.193 kg)  SpO2 98%  LMP 03/15/2016 Body mass index is 33.58 kg/(m^2). Gen: Afebrile. No acute distress. ** HENT: AT. [place]. Bilateral TM visualized, RT TM with erythema, bulging and pus-like fluid level. Left TM with bulging, mild erythema, serious effusion. MMM, no oral lesions. Bilateral nares with erythema and swelling. Throat without erythema or exudates. No cough or hoarseness on exam. TTP bilateral maxillary sinus.  Eyes:Pupils Equal Round Reactive to light, Extraocular movements intact,  Conjunctiva without redness, discharge or icterus. Neck/lymp/endocrine: Supple, no  Lymphadenopathy CV: RRR  Chest: CTAB, no wheeze or crackles. Good air movement, normal resp effort.  Abd: Soft. NTND. BS present Skin: No rashes, purpura or petechiae.  Neuro: Normal gait. PERLA. EOMi. Alert. Oriented x3 Psych: Normal affect, dress and demeanor. Normal speech. Normal thought content and judgment..   Assessment/Plan: Heather [name] is a 45 y.o. female present for acute OV for Acute maxillary sinusitis, recurrence not specified - rest, hydrate, monitor sugars closely - flonase, antihistamine of choice recommneded - amoxicillin 500 mg BID x10 days Acute suppurative otitis media of right ear without spontaneous rupture of tympanic membrane, recurrence not specified - definite suppurative collection behind TM - Amoxil renally dosed  prescribed  Type 2 diabetes mellitus with complication, with long-term current use of insulin ([place]) - pt cautioned to monitor sugars at home closely, infection can cause elevated BG.  - If needing assistance, she was encouraged to call her endocrinologist.   Chronic kidney disease, stage IV (severe) ([place]) - no NSAIDS - abx renally dosed.   - if worsening needs to be seen immediately, if sugars become elevated pt is to call endocrine immediately for guidance, or report  to ED.   Electronically Signed by: Howard Pouch, DO [facility] [place]

## 2016-03-22 ENCOUNTER — Ambulatory Visit (HOSPITAL_COMMUNITY): Payer: Managed Care, Other (non HMO)

## 2016-03-26 ENCOUNTER — Encounter (HOSPITAL_COMMUNITY)
Admission: RE | Admit: 2016-03-26 | Discharge: 2016-03-26 | Disposition: A | Discharge status: Home / Self Care | Payer: Managed Care, Other (non HMO) | Source: Ambulatory Visit | Attending: Nephrology | Admitting: Nephrology

## 2016-03-26 DIAGNOSIS — N182 Chronic kidney disease, stage 2 (mild): Secondary | ICD-10-CM

## 2016-03-26 LAB — POCT HEMOGLOBIN-HEMACUE: HEMOGLOBIN: 12.7 g/dL (ref 12.0–15.0)

## 2016-03-26 MED ORDER — EPOETIN ALFA 40000 UNIT/ML IJ SOLN: 30000.0000 [IU] | INTRAMUSCULAR | Status: DC

## 2016-04-09 ENCOUNTER — Encounter (HOSPITAL_COMMUNITY)
Admission: RE | Admit: 2016-04-09 | Discharge: 2016-04-09 | Disposition: A | Discharge status: Home / Self Care | Payer: Managed Care, Other (non HMO) | Source: Ambulatory Visit | Attending: Nephrology | Admitting: Nephrology

## 2016-04-09 DIAGNOSIS — N182 Chronic kidney disease, stage 2 (mild): Secondary | ICD-10-CM | POA: Diagnosis present

## 2016-04-09 LAB — IRON AND TIBC
IRON: 125 ug/dL (ref 28–170)
SATURATION RATIOS: 54 % — AB (ref 10.4–31.8)
TIBC: 231 ug/dL — AB (ref 250–450)
UIBC: 106 ug/dL

## 2016-04-09 LAB — FERRITIN: FERRITIN: 937 ng/mL — AB (ref 11–307)

## 2016-04-09 LAB — POCT HEMOGLOBIN-HEMACUE: Hemoglobin: 11.6 g/dL — ABNORMAL LOW (ref 12.0–15.0)

## 2016-04-09 MED ORDER — EPOETIN ALFA 20000 UNIT/ML IJ SOLN
INTRAMUSCULAR | Status: AC
  Administered 2016-04-09: 20000 [IU]
  Filled 2016-04-09: qty 1

## 2016-04-09 MED ORDER — EPOETIN ALFA 40000 UNIT/ML IJ SOLN: 30000.0000 [IU] | INTRAMUSCULAR | Status: DC

## 2016-04-09 MED ORDER — EPOETIN ALFA 10000 UNIT/ML IJ SOLN
INTRAMUSCULAR | Status: AC
  Administered 2016-04-09: 10000 [IU]
  Filled 2016-04-09: qty 1

## 2016-04-22 ENCOUNTER — Other Ambulatory Visit (HOSPITAL_COMMUNITY): Payer: Self-pay | Admitting: *Deleted

## 2016-04-23 ENCOUNTER — Inpatient Hospital Stay (HOSPITAL_COMMUNITY): Admission: RE | Admit: 2016-04-23 | Payer: Managed Care, Other (non HMO) | Source: Ambulatory Visit

## 2016-04-26 ENCOUNTER — Ambulatory Visit: Payer: Managed Care, Other (non HMO) | Admitting: Family Medicine

## 2016-04-26 DIAGNOSIS — Z0289 Encounter for other administrative examinations: Secondary | ICD-10-CM

## 2016-05-10 ENCOUNTER — Ambulatory Visit: Payer: Managed Care, Other (non HMO) | Admitting: Family Medicine

## 2016-05-12 ENCOUNTER — Encounter: Payer: Self-pay | Admitting: Family Medicine

## 2016-05-12 ENCOUNTER — Ambulatory Visit (INDEPENDENT_AMBULATORY_CARE_PROVIDER_SITE_OTHER): Payer: Managed Care, Other (non HMO) | Admitting: Family Medicine

## 2016-05-12 VITALS — BP 175/113 | HR 89 | Temp 98.3°F | Resp 20 | Wt 229.5 lb

## 2016-05-12 DIAGNOSIS — N184 Chronic kidney disease, stage 4 (severe): Secondary | ICD-10-CM | POA: Diagnosis not present

## 2016-05-12 DIAGNOSIS — J0100 Acute maxillary sinusitis, unspecified: Secondary | ICD-10-CM | POA: Diagnosis not present

## 2016-05-12 DIAGNOSIS — I10 Essential (primary) hypertension: Secondary | ICD-10-CM

## 2016-05-12 DIAGNOSIS — E1140 Type 2 diabetes mellitus with diabetic neuropathy, unspecified: Secondary | ICD-10-CM | POA: Diagnosis not present

## 2016-05-12 DIAGNOSIS — E118 Type 2 diabetes mellitus with unspecified complications: Secondary | ICD-10-CM | POA: Diagnosis not present

## 2016-05-12 DIAGNOSIS — E08319 Diabetes mellitus due to underlying condition with unspecified diabetic retinopathy without macular edema: Secondary | ICD-10-CM

## 2016-05-12 DIAGNOSIS — Z794 Long term (current) use of insulin: Secondary | ICD-10-CM

## 2016-05-12 MED ORDER — INSULIN LISPRO 100 UNIT/ML ~~LOC~~ SOLN: 10.0000 [IU] | Freq: Three times a day (TID) | SUBCUTANEOUS | Status: DC

## 2016-05-12 MED ORDER — DOXYCYCLINE HYCLATE 100 MG PO TABS: 100.0000 mg | ORAL_TABLET | Freq: Two times a day (BID) | ORAL | Status: DC

## 2016-05-12 NOTE — Patient Instructions (Signed)
Floanse, allegra daily.  Start doxycyline  Every 12 hours for 10 days.  Follow with new endocrinology and have them send all records are way, please let us know the name as well to add to your records. Please have eye doctor send records as well.   Monitor BP at home and write down, bring with you in 7-14 days to discuss. We may need to add more medication if you are taken the medication correctly and still having elevated BP. Goal below 140/90

## 2016-05-12 NOTE — Progress Notes (Signed)
Patient ID: Heather [name], female   DOB: 1971/02/05, 45 y.o.   MRN: [id number]    Heather [name] , 05-11-1971, 45 y.o., female MRN: [id number]  CC: allergic rhinitis Subjective:       Acute maxillary sinusitis, recurrence not specified: patient had been taking daily flonase and  Allegra. She reports increase pressure in her ears, left sinus, and behind bilateral eyes. She also endorses a dry cough. She denies nausea, vomit, diarrhea, fever or chills. She was treated for a sinus infection the end of last year with amox, but did not feel it worked well.    Type 2 diabetes mellitus with complication, with long-term current use of insulin (HCC)/Diabetic retinopathy/diabteic neuropathy She has been noncompliant with endocrinology for uncontrolled DM with complications follow up and is transitioning to a new endocrinologist. Pt asking for refills on Humalog today. This has never been managed by this provider. She was last seen by Dr. Chalmers Cater in Oct. 2016, but states she is transferring to endocrinologist closer to home in [place]. She does not know the name of this endocrinologist, but will call back today to give information. Last a1c in the hospital setting >11%. Pt is able to read off her insulin regimen.of Levemir and Humalog. Noncompliance has been an issue by provider notes. Overdue foot exam. Patient has severe Neuropathy bilaterally. She denies any nonhealing wounds. She has a history of Diabetic retinopathy and is established with Ophthalmology with "routine" eye exams.   Chronic kidney disease, stage IV (severe) (HCC)/Essential hypertension: [name] established with [facility], followed every 3 mos. For CKD stage 4, hypertension, PTH/vit d, and anemia of chronic disease. Pt states she missed her May 8th appt because they sent the appt card to the wrong address (she moved). [name] rescheduling for sometime within the next week if possible.  She states when she was recently hospitalized they  changed her BP medications because she had "low BP". Note states they were concerned for AKI. She states she thinks she is taking amlodipine and lisinopril daily now. They had taken her off one, but she restarted because she had headaches after being discharged. She states she does have a BP cuff at home. Discharge summary also states metoprolol was started, but this is not on her list and she does not believe she taking it. She was also started on Lipitor, although she is not Likely taking as she has stopped taking statins in the past, and she has not asked for refills on this medication.  Allergies  Allergen Reactions  . Ibuprofen Other (See Comments)    CKD stage 3. Should avoid.  . Dilaudid [Hydromorphone Hcl] Itching    Can take with benadryl   Social History  Substance Use Topics  . Smoking status: Never Smoker   . Smokeless tobacco: Never Used  . Alcohol Use: No   Past Medical History  Diagnosis Date  . Gastroparesis   . Anemia   . IBS (irritable bowel syndrome)   . Nausea   . Hx: UTI (urinary tract infection)   . Fibroids     leiomyoma 8/04 per [name]  . Fibromyalgia   . Diabetes mellitus   . Hypertension   . Chronic kidney disease   . Allergy   . Blood transfusion without reported diagnosis   . Infertility     eval at [facility]  . Obesity   . Pancreatitis    Past Surgical History  Procedure Laterality Date  . Eye surgery  bilateral, vitrectomies  . Fibroid tumors removed    . Foot surgery      bilateral   Family History  Problem Relation Age of Onset  . Colon polyps Mother   . Diabetes Mother   . Heart murmur Father   . Hypertension Father   . Diabetes Sister   . Kidney failure Sister   . Diabetes Brother   . Retinal degeneration Brother   . Heart murmur Son   . Stroke Paternal Grandmother   . Diabetes Sister      Medication List       This list is accurate as of: 05/12/16 11:41 AM.  Always use your most recent med list.               amLODipine  10 MG tablet  Commonly known as:  NORVASC  Take by mouth.     calcitRIOL 0.25 MCG capsule  Commonly known as:  ROCALTROL  Take 0.5 mcg by mouth at bedtime.     epoetin alfa 10000 UNIT/ML injection  Commonly known as:  EPOGEN,PROCRIT  Inject 30,000 Units into the vein every 14 (fourteen) days.     ferumoxytol 510 MG/17ML Soln injection  Commonly known as:  FERAHEME  Inject 1,020 mg into the vein every 14 (fourteen) days.     fluticasone 50 MCG/ACT nasal spray  Commonly known as:  FLONASE  Place 2 sprays into both nostrils daily.     furosemide 80 MG tablet  Commonly known as:  LASIX  Take 1 tablet (80 mg total) by mouth 2 (two) times daily.     gabapentin 300 MG capsule  Commonly known as:  NEURONTIN  Take 1 capsule (300 mg total) by mouth 2 (two) times daily as needed.     glucose blood test strip  Commonly known as:  ONETOUCH VERIO  Test 4 times daily.     insulin detemir 100 UNIT/ML injection  Commonly known as:  LEVEMIR  Inject 0.19 mLs (19 Units total) into the skin daily. Inject at lunch time.     insulin lispro 100 UNIT/ML injection  Commonly known as:  HUMALOG  Inject 0.1 mLs (10 Units total) into the skin 3 (three) times daily before meals.     LIPITOR 40 MG tablet  Generic drug:  atorvastatin  Take by mouth.     lisinopril 40 MG tablet  Commonly known as:  PRINIVIL,ZESTRIL  Take 1 tablet (40 mg total) by mouth daily.         ROS: Negative, with the exception of above mentioned in HPI   Objective:  BP 175/113 mmHg  Pulse 89  Temp(Src) 98.3 F (36.8 C)  Resp 20  Wt 229 lb 8 oz (104.101 kg)  SpO2 98%  LMP 05/06/2016 Body mass index is 33.88 kg/(m^2). Gen: Afebrile. No acute distress. Nontoxic in appearance, obese, AAF, very pleasant.  HENT: AT. [place]. Bilateral TM visualized, air fluid levels bilateral. Normal EOM.  MMM, no oral lesions. Bilateral nares severe erythema and swelling. Throat without erythema or exudates. Mild cough on exam. No  hoarseness on exam. TTP left max sinus.  Eyes:Pupils Equal Round Reactive to light, Extraocular movements intact,  Conjunctiva without redness, discharge or icterus. Neck/lymp/endocrine: Supple,No  Lymphadenopathy CV: RRR , trace edema.  Chest: CTAB, no wheeze or crackles. Good air movement, normal resp effort.  Skin: No rashes, purpura or petechiae.  Neuro: Normal gait. PERLA. EOMi. Alert. Oriented x3 Psych: Normal affect, dress and demeanor. Normal speech. Normal thought content  and judgment..   Assessment/Plan: ERIAL ANGERMEIER is a 45 y.o. female present for acute OV for sinus pain.  -She has been noncompliant with endocrinology for uncontrolled DM with complications follow up and is transitioning to a new endocrinologist.  -  [name] established with [facility], followed every 3 mos. For CKD stage 4, hypertension, PTH/vit d, and anemia of chronic disease.  -Pt was recently hospitalized last month, but did not follow up for post hospital follow up appt. Type 2 diabetes mellitus with complication, with long-term current use of insulin (HCC)/Diabetic retinopathy/diabteic neuropathy -pt asking for refills on Humalog today. This has never been managed by this provider. She was last seen by Dr. Chalmers Cater in Oct. 2016, but states she is transferring to endocrinologist closer to home in [place]. She does not know the name of this endocrinologist, but will call back today to give information . - discussed with her I will refill humalog x1 to bridge her to her endocrinologist appt, but hse must continue management and all diabetes medication through new endocrine. Again, stressed importance of lower a1c <7.  - foot exam completed today, podiatry referral placed. - Pt reports routine eye exam through 2 ophthalmologist.  - insulin lispro (HUMALOG) 100 UNIT/ML injection; Inject 0.1 mLs (10 Units total) into the skin 3 (three) times daily before meals.  Dispense: 10 mL; Refill: 0 - Ambulatory referral to  Podiatry  Acute maxillary sinusitis, recurrence not specified - continue flonase, allegra and start doxy. Consider mucinex.  - doxycycline (VIBRA-TABS) 100 MG tablet; Take 1 tablet (100 mg total) by mouth 2 (two) times daily.  Dispense: 20 tablet; Refill: 0  Chronic kidney disease, stage IV (severe) (HCC)/Essential hypertension: - last nephrology note states they will be following protein:cr ratio at her upcoming appt.  - Pts BP is elevated. Uncertain what regimen she is actually taking. Appears she was D/C'd on ASA, Lipitor and metoprolol by note. ? +/- amlodipine.?. Pt does not seem to know exactly what she is taking.  - discussed close follow up for BP. [name] to restart meds reported by nephrology every day. Lisinopril 40 mg and lasix.if BP >140/90 on this regimen she is to add the amlodipine (if not already taking both). [name] to follow with either Nephrology (she states upcoming appt) or myself 7-14 days for hypertension with BP log.   - attempted to stress her need for routine follow up every 3 mos with endocrine, nephrology and PCP to manage chronic issues.   Greater than 40 minutes was spent with patient, greater than 50% of that time was spent face-to-face with patient counseling and coordinating care.   electronically signed by:  Howard Pouch, DO  [place]

## 2016-05-14 ENCOUNTER — Encounter (HOSPITAL_COMMUNITY)
Admission: RE | Admit: 2016-05-14 | Discharge: 2016-05-14 | Disposition: A | Discharge status: Home / Self Care | Payer: Managed Care, Other (non HMO) | Source: Ambulatory Visit | Attending: Nephrology | Admitting: Nephrology

## 2016-05-14 ENCOUNTER — Encounter: Payer: Self-pay | Admitting: Family Medicine

## 2016-05-14 DIAGNOSIS — N182 Chronic kidney disease, stage 2 (mild): Secondary | ICD-10-CM | POA: Insufficient documentation

## 2016-05-14 LAB — FERRITIN: Ferritin: 735 ng/mL — ABNORMAL HIGH (ref 11–307)

## 2016-05-14 LAB — IRON AND TIBC
Iron: 72 ug/dL (ref 28–170)
Saturation Ratios: 28 % (ref 10.4–31.8)
TIBC: 259 ug/dL (ref 250–450)
UIBC: 187 ug/dL

## 2016-05-14 MED ORDER — EPOETIN ALFA 20000 UNIT/ML IJ SOLN
INTRAMUSCULAR | Status: AC
  Administered 2016-05-14: 20000 [IU] via SUBCUTANEOUS
  Filled 2016-05-14: qty 1

## 2016-05-14 MED ORDER — EPOETIN ALFA 40000 UNIT/ML IJ SOLN: 30000.0000 [IU] | INTRAMUSCULAR | Status: DC

## 2016-05-14 MED ORDER — EPOETIN ALFA 10000 UNIT/ML IJ SOLN
INTRAMUSCULAR | Status: AC
  Administered 2016-05-14: 10000 [IU] via SUBCUTANEOUS
  Filled 2016-05-14: qty 1

## 2016-05-17 LAB — POCT HEMOGLOBIN-HEMACUE: Hemoglobin: 10.5 g/dL — ABNORMAL LOW (ref 12.0–15.0)

## 2016-05-19 ENCOUNTER — Telehealth: Payer: Self-pay | Admitting: *Deleted

## 2016-05-19 DIAGNOSIS — Z794 Long term (current) use of insulin: Principal | ICD-10-CM

## 2016-05-19 DIAGNOSIS — E118 Type 2 diabetes mellitus with unspecified complications: Secondary | ICD-10-CM

## 2016-05-19 NOTE — Telephone Encounter (Signed)
Patient called Heather [name] is requesting a referral to a different Endocrinologist. Patient states Heather [name] didn't feel like Heather [name] was getting what Heather [name] needed with Dr Chalmers Cater. Do you want to refer her somewhere else? Please advise.

## 2016-05-19 NOTE — Telephone Encounter (Signed)
Spoke with patient reviewed information and instructions. Patient verbalized understanding. 

## 2016-05-19 NOTE — Telephone Encounter (Signed)
Will place new referral for pt today. This can take some time to process, and I would advise pt to continue following with Dr. Chalmers Cater until she is established at new place. She should not miss her appts with current endocrine, or go without insulin.

## 2016-05-27 ENCOUNTER — Other Ambulatory Visit (HOSPITAL_COMMUNITY): Payer: Self-pay | Admitting: *Deleted

## 2016-05-27 ENCOUNTER — Other Ambulatory Visit: Payer: Self-pay | Admitting: Obstetrics & Gynecology

## 2016-05-27 DIAGNOSIS — N644 Mastodynia: Secondary | ICD-10-CM

## 2016-05-28 ENCOUNTER — Encounter (HOSPITAL_COMMUNITY)
Admission: RE | Admit: 2016-05-28 | Discharge: 2016-05-28 | Disposition: A | Discharge status: Home / Self Care | Payer: Managed Care, Other (non HMO) | Source: Ambulatory Visit | Attending: Nephrology | Admitting: Nephrology

## 2016-05-28 DIAGNOSIS — N183 Chronic kidney disease, stage 3 (moderate): Secondary | ICD-10-CM | POA: Insufficient documentation

## 2016-05-28 DIAGNOSIS — Z79899 Other long term (current) drug therapy: Secondary | ICD-10-CM | POA: Diagnosis not present

## 2016-05-28 DIAGNOSIS — Z5181 Encounter for therapeutic drug level monitoring: Secondary | ICD-10-CM | POA: Insufficient documentation

## 2016-05-28 DIAGNOSIS — D509 Iron deficiency anemia, unspecified: Secondary | ICD-10-CM | POA: Diagnosis not present

## 2016-05-28 DIAGNOSIS — D631 Anemia in chronic kidney disease: Secondary | ICD-10-CM | POA: Diagnosis not present

## 2016-05-28 DIAGNOSIS — N182 Chronic kidney disease, stage 2 (mild): Secondary | ICD-10-CM

## 2016-05-28 LAB — POCT HEMOGLOBIN-HEMACUE: HEMOGLOBIN: 10 g/dL — AB (ref 12.0–15.0)

## 2016-05-28 MED ORDER — EPOETIN ALFA 10000 UNIT/ML IJ SOLN
INTRAMUSCULAR | Status: AC
  Administered 2016-05-28: 10000 [IU]
  Filled 2016-05-28: qty 1

## 2016-05-28 MED ORDER — EPOETIN ALFA 20000 UNIT/ML IJ SOLN
INTRAMUSCULAR | Status: AC
  Administered 2016-05-28: 20000 [IU]
  Filled 2016-05-28: qty 1

## 2016-05-28 MED ORDER — SODIUM CHLORIDE 0.9 % IV SOLN
510.0000 mg | Freq: Once | INTRAVENOUS | Status: AC
  Administered 2016-05-28: 510 mg via INTRAVENOUS
  Filled 2016-05-28: qty 17

## 2016-05-28 MED ORDER — EPOETIN ALFA 40000 UNIT/ML IJ SOLN: 30000.0000 [IU] | INTRAMUSCULAR | Status: DC

## 2016-06-01 ENCOUNTER — Ambulatory Visit
Admission: RE | Admit: 2016-06-01 | Discharge: 2016-06-01 | Disposition: A | Discharge status: Home / Self Care | Payer: Managed Care, Other (non HMO) | Source: Ambulatory Visit | Attending: Obstetrics & Gynecology | Admitting: Obstetrics & Gynecology

## 2016-06-01 DIAGNOSIS — N644 Mastodynia: Secondary | ICD-10-CM

## 2016-06-03 ENCOUNTER — Other Ambulatory Visit (HOSPITAL_COMMUNITY): Payer: Self-pay | Admitting: Obstetrics & Gynecology

## 2016-06-03 DIAGNOSIS — N979 Female infertility, unspecified: Secondary | ICD-10-CM

## 2016-06-08 ENCOUNTER — Encounter: Payer: Self-pay | Admitting: Podiatry

## 2016-06-08 ENCOUNTER — Ambulatory Visit (INDEPENDENT_AMBULATORY_CARE_PROVIDER_SITE_OTHER): Payer: Managed Care, Other (non HMO) | Admitting: Podiatry

## 2016-06-08 VITALS — BP 142/78 | HR 66 | Resp 12

## 2016-06-08 DIAGNOSIS — M79676 Pain in unspecified toe(s): Secondary | ICD-10-CM | POA: Diagnosis not present

## 2016-06-08 DIAGNOSIS — M216X2 Other acquired deformities of left foot: Secondary | ICD-10-CM

## 2016-06-08 DIAGNOSIS — B351 Tinea unguium: Secondary | ICD-10-CM

## 2016-06-08 DIAGNOSIS — E1142 Type 2 diabetes mellitus with diabetic polyneuropathy: Secondary | ICD-10-CM | POA: Diagnosis not present

## 2016-06-08 DIAGNOSIS — M216X1 Other acquired deformities of right foot: Secondary | ICD-10-CM | POA: Diagnosis not present

## 2016-06-08 NOTE — Patient Instructions (Signed)
Diabetic foot screen today circulation and adequate Decreased feeling in your feet consistent with peripheral neuropathy Excessive ingrowing of the (hyperpronation) Debride great toenails and return as needed for debridement Digital scan obtained today for custom foot orthotics   Diabetes and Foot Care Diabetes may cause you to have problems because of poor blood supply (circulation) to your feet and legs. This may cause the skin on your feet to become thinner, break easier, and heal more slowly. Your skin may become dry, and the skin may peel and crack. You may also have nerve damage in your legs and feet causing decreased feeling in them. You may not notice minor injuries to your feet that could lead to infections or more serious problems. Taking care of your feet is one of the most important things you can do for yourself.  HOME CARE INSTRUCTIONS  Wear shoes at all times, even in the house. Do not go barefoot. Bare feet are easily injured.  Check your feet daily for blisters, cuts, and redness. If you cannot see the bottom of your feet, use a mirror or ask someone for help.  Wash your feet with warm water (do not use hot water) and mild soap. Then pat your feet and the areas between your toes until they are completely dry. Do not soak your feet as this can dry your skin.  Apply a moisturizing lotion or petroleum jelly (that does not contain alcohol and is unscented) to the skin on your feet and to dry, brittle toenails. Do not apply lotion between your toes.  Trim your toenails straight across. Do not dig under them or around the cuticle. File the edges of your nails with an emery board or nail file.  Do not cut corns or calluses or try to remove them with medicine.  Wear clean socks or stockings every day. Make sure they are not too tight. Do not wear knee-high stockings since they may decrease blood flow to your legs.  Wear shoes that fit properly and have enough cushioning. To break in  new shoes, wear them for just a few hours a day. This prevents you from injuring your feet. Always look in your shoes before you put them on to be sure there are no objects inside.  Do not cross your legs. This may decrease the blood flow to your feet.  If you find a minor scrape, cut, or break in the skin on your feet, keep it and the skin around it clean and dry. These areas may be cleansed with mild soap and water. Do not cleanse the area with peroxide, alcohol, or iodine.  When you remove an adhesive bandage, be sure not to damage the skin around it.  If you have a wound, look at it several times a day to make sure it is healing.  Do not use heating pads or hot water bottles. They may burn your skin. If you have lost feeling in your feet or legs, you may not know it is happening until it is too late.  Make sure your health care provider performs a complete foot exam at least annually or more often if you have foot problems. Report any cuts, sores, or bruises to your health care provider immediately. SEEK MEDICAL CARE IF:   You have an injury that is not healing.  You have cuts or breaks in the skin.  You have an ingrown nail.  You notice redness on your legs or feet.  You feel burning or tingling  in your legs or feet.  You have pain or cramps in your legs and feet.  Your legs or feet are numb.  Your feet always feel cold. SEEK IMMEDIATE MEDICAL CARE IF:   There is increasing redness, swelling, or pain in or around a wound.  There is a red line that goes up your leg.  Pus is coming from a wound.  You develop a fever or as directed by your health care provider.  You notice a bad smell coming from an ulcer or wound.   This information is not intended to replace advice given to you by your health care provider. Make sure you discuss any questions you have with your health care provider.   Document Released: 12/10/2000 Document Revised: 08/15/2013 Document Reviewed:  05/22/2013 Elsevier Interactive Patient Education [employer].

## 2016-06-08 NOTE — Progress Notes (Signed)
   Subjective:    Patient ID: Heather [name], female    DOB: 1971/10/01, 45 y.o.   MRN: [id number]  HPI     This patient presents today primarily concerned about her rather diffuse foot pain and achiness that occurs on standing walking for the past 6 months. Patient has tried Dr. Felicie Morn orthotics at [facility] without any reduction of her discomfort. The symptoms are relieved with rest and elevation. 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 She also has concern about a thickening in her hallux toenails over the last 6 months without any specific self treatment  Patient is a diabetic and describes a history of foot ulceration approximate 4 years ago that resolved with local wound care and has not reoccurred. She denies claudication or amputation  Review of Systems  Constitutional: Positive for fatigue.  Cardiovascular: Positive for leg swelling.  Musculoskeletal: Positive for gait problem.       Objective:   Physical Exam  Orientated 3  Vascular: No peripheral edema bilaterally DP and PT pulses 2/4 bilaterally Capillary reflex immediate bilaterally  Neurological: Sensation to 10 g monofilament wire intact 0/5 bilaterally Vibratory sensation nonreactive bilaterally Ankle reflex equal and reactive bilaterally  Dermatological: No open skin lesions bilaterally The hallux toenails are hypertrophic but deformed and discolored  Musculoskeletal: Upon standing significant hyperpronation is noted bilaterally Pes planus bilaterally Hammertoe third right      Assessment & Plan:   Assessment: Diabetic peripheral neuropathy Hyperpronation/pes planus bilaterally Mycotic hallux toenails  Plan: Today I reviewed the results of the examination with patient today made aware that she did have significant neuropathy. We discussed generalized foot care. The hallux toenails were  debrided mechanically and elected without any bleeding I recommended a rigid accommodative orthotic to reduce the symptoms of hyperpronation and she verbally consents  Will recommend a rigid orthotic as patient has failed soft over-the-counter orthotics Rx poly-pro firm full length with 3 mm pelite top cover  Contact patient when orthotics arrive

## 2016-06-10 ENCOUNTER — Ambulatory Visit (HOSPITAL_COMMUNITY): Payer: Managed Care, Other (non HMO)

## 2016-06-11 ENCOUNTER — Encounter (HOSPITAL_COMMUNITY)
Admission: RE | Admit: 2016-06-11 | Discharge: 2016-06-11 | Disposition: A | Discharge status: Home / Self Care | Payer: Managed Care, Other (non HMO) | Source: Ambulatory Visit | Attending: Nephrology | Admitting: Nephrology

## 2016-06-11 ENCOUNTER — Ambulatory Visit (HOSPITAL_COMMUNITY)
Admission: RE | Admit: 2016-06-11 | Discharge: 2016-06-11 | Disposition: A | Discharge status: Home / Self Care | Payer: Managed Care, Other (non HMO) | Source: Ambulatory Visit | Attending: Obstetrics & Gynecology | Admitting: Obstetrics & Gynecology

## 2016-06-11 DIAGNOSIS — N182 Chronic kidney disease, stage 2 (mild): Secondary | ICD-10-CM

## 2016-06-11 DIAGNOSIS — N979 Female infertility, unspecified: Secondary | ICD-10-CM | POA: Insufficient documentation

## 2016-06-11 DIAGNOSIS — N183 Chronic kidney disease, stage 3 (moderate): Secondary | ICD-10-CM | POA: Diagnosis not present

## 2016-06-11 LAB — IRON AND TIBC
IRON: 68 ug/dL (ref 28–170)
Saturation Ratios: 28 % (ref 10.4–31.8)
TIBC: 239 ug/dL — ABNORMAL LOW (ref 250–450)
UIBC: 171 ug/dL

## 2016-06-11 LAB — FERRITIN: Ferritin: 962 ng/mL — ABNORMAL HIGH (ref 11–307)

## 2016-06-11 LAB — POCT HEMOGLOBIN-HEMACUE: Hemoglobin: 10.5 g/dL — ABNORMAL LOW (ref 12.0–15.0)

## 2016-06-11 MED ORDER — IOPAMIDOL (ISOVUE-300) INJECTION 61%
30.0000 mL | Freq: Once | INTRAVENOUS | Status: AC | PRN
  Administered 2016-06-11: 10 mL

## 2016-06-11 MED ORDER — EPOETIN ALFA 20000 UNIT/ML IJ SOLN
INTRAMUSCULAR | Status: AC
  Administered 2016-06-11: 20000 [IU] via SUBCUTANEOUS
  Filled 2016-06-11: qty 1

## 2016-06-11 MED ORDER — EPOETIN ALFA 40000 UNIT/ML IJ SOLN: 30000.0000 [IU] | INTRAMUSCULAR | Status: DC

## 2016-06-11 MED ORDER — EPOETIN ALFA 10000 UNIT/ML IJ SOLN
INTRAMUSCULAR | Status: AC
  Administered 2016-06-11: 10000 [IU] via SUBCUTANEOUS
  Filled 2016-06-11: qty 1

## 2016-06-17 ENCOUNTER — Ambulatory Visit: Payer: Managed Care, Other (non HMO) | Admitting: Dietician

## 2016-06-22 DIAGNOSIS — IMO0002 Reserved for concepts with insufficient information to code with codable children: Secondary | ICD-10-CM | POA: Insufficient documentation

## 2016-06-22 DIAGNOSIS — E1165 Type 2 diabetes mellitus with hyperglycemia: Secondary | ICD-10-CM

## 2016-06-22 DIAGNOSIS — E113513 Type 2 diabetes mellitus with proliferative diabetic retinopathy with macular edema, bilateral: Secondary | ICD-10-CM | POA: Insufficient documentation

## 2016-06-22 DIAGNOSIS — Z794 Long term (current) use of insulin: Secondary | ICD-10-CM

## 2016-06-24 ENCOUNTER — Other Ambulatory Visit (HOSPITAL_COMMUNITY): Payer: Self-pay | Admitting: *Deleted

## 2016-06-25 ENCOUNTER — Encounter (HOSPITAL_COMMUNITY)
Admission: RE | Admit: 2016-06-25 | Discharge: 2016-06-25 | Disposition: A | Discharge status: Home / Self Care | Payer: Managed Care, Other (non HMO) | Source: Ambulatory Visit | Attending: Nephrology | Admitting: Nephrology

## 2016-06-25 DIAGNOSIS — E1169 Type 2 diabetes mellitus with other specified complication: Secondary | ICD-10-CM | POA: Insufficient documentation

## 2016-06-25 DIAGNOSIS — E785 Hyperlipidemia, unspecified: Secondary | ICD-10-CM

## 2016-06-25 DIAGNOSIS — Z5181 Encounter for therapeutic drug level monitoring: Secondary | ICD-10-CM | POA: Diagnosis not present

## 2016-06-25 DIAGNOSIS — N182 Chronic kidney disease, stage 2 (mild): Secondary | ICD-10-CM

## 2016-06-25 DIAGNOSIS — Z79899 Other long term (current) drug therapy: Secondary | ICD-10-CM | POA: Diagnosis not present

## 2016-06-25 DIAGNOSIS — N183 Chronic kidney disease, stage 3 (moderate): Secondary | ICD-10-CM | POA: Diagnosis present

## 2016-06-25 DIAGNOSIS — D509 Iron deficiency anemia, unspecified: Secondary | ICD-10-CM | POA: Diagnosis not present

## 2016-06-25 DIAGNOSIS — D631 Anemia in chronic kidney disease: Secondary | ICD-10-CM | POA: Insufficient documentation

## 2016-06-25 HISTORY — DX: Type 2 diabetes mellitus with other specified complication: E11.69

## 2016-06-25 HISTORY — DX: Hyperlipidemia, unspecified: E78.5

## 2016-06-25 LAB — POCT HEMOGLOBIN-HEMACUE: HEMOGLOBIN: 11.4 g/dL — AB (ref 12.0–15.0)

## 2016-06-25 MED ORDER — EPOETIN ALFA 10000 UNIT/ML IJ SOLN
INTRAMUSCULAR | Status: AC
  Administered 2016-06-25: 10000 [IU]
  Filled 2016-06-25: qty 1

## 2016-06-25 MED ORDER — SODIUM CHLORIDE 0.9 % IV SOLN
510.0000 mg | Freq: Once | INTRAVENOUS | Status: AC
  Administered 2016-06-25: 510 mg via INTRAVENOUS
  Filled 2016-06-25: qty 17

## 2016-06-25 MED ORDER — EPOETIN ALFA 40000 UNIT/ML IJ SOLN: 30000.0000 [IU] | INTRAMUSCULAR | Status: DC

## 2016-06-25 MED ORDER — EPOETIN ALFA 20000 UNIT/ML IJ SOLN
INTRAMUSCULAR | Status: AC
  Administered 2016-06-25: 20000 [IU]
  Filled 2016-06-25: qty 1

## 2016-07-02 ENCOUNTER — Encounter: Payer: Managed Care, Other (non HMO) | Admitting: Podiatry

## 2016-07-02 NOTE — Progress Notes (Deleted)
Pt presents to pick up orthotics, orthotics with instructions were dispense, she is to f/u with any complications or questions

## 2016-07-08 ENCOUNTER — Other Ambulatory Visit (HOSPITAL_COMMUNITY): Payer: Self-pay | Admitting: *Deleted

## 2016-07-09 ENCOUNTER — Encounter (HOSPITAL_COMMUNITY)
Admission: RE | Admit: 2016-07-09 | Discharge: 2016-07-09 | Disposition: A | Discharge status: Home / Self Care | Payer: Managed Care, Other (non HMO) | Source: Ambulatory Visit | Attending: Nephrology | Admitting: Nephrology

## 2016-07-09 DIAGNOSIS — N182 Chronic kidney disease, stage 2 (mild): Secondary | ICD-10-CM | POA: Insufficient documentation

## 2016-07-09 LAB — IRON AND TIBC
Iron: 82 ug/dL (ref 28–170)
SATURATION RATIOS: 32 % — AB (ref 10.4–31.8)
TIBC: 256 ug/dL (ref 250–450)
UIBC: 174 ug/dL

## 2016-07-09 LAB — FERRITIN: FERRITIN: 1180 ng/mL — AB (ref 11–307)

## 2016-07-09 LAB — POCT HEMOGLOBIN-HEMACUE: HEMOGLOBIN: 10.6 g/dL — AB (ref 12.0–15.0)

## 2016-07-09 MED ORDER — EPOETIN ALFA 10000 UNIT/ML IJ SOLN
INTRAMUSCULAR | Status: AC
  Administered 2016-07-09: 10000 [IU] via SUBCUTANEOUS
  Filled 2016-07-09: qty 1

## 2016-07-09 MED ORDER — EPOETIN ALFA 40000 UNIT/ML IJ SOLN: 30000.0000 [IU] | INTRAMUSCULAR | Status: DC

## 2016-07-09 MED ORDER — EPOETIN ALFA 20000 UNIT/ML IJ SOLN
INTRAMUSCULAR | Status: AC
  Administered 2016-07-09: 20000 [IU] via SUBCUTANEOUS
  Filled 2016-07-09: qty 1

## 2016-07-21 ENCOUNTER — Ambulatory Visit: Payer: Managed Care, Other (non HMO) | Admitting: *Deleted

## 2016-07-21 DIAGNOSIS — M216X2 Other acquired deformities of left foot: Principal | ICD-10-CM

## 2016-07-21 DIAGNOSIS — M216X1 Other acquired deformities of right foot: Secondary | ICD-10-CM

## 2016-07-21 NOTE — Progress Notes (Signed)
Patient presents for orthotic pick up.  Verbal and written break in and wear instructions given.  Patient will follow up in 4 weeks if symptoms worsen or fail to improve. 

## 2016-07-21 NOTE — Patient Instructions (Signed)

## 2016-07-22 ENCOUNTER — Other Ambulatory Visit: Payer: Self-pay | Admitting: Nephrology

## 2016-07-22 ENCOUNTER — Other Ambulatory Visit (HOSPITAL_COMMUNITY): Payer: Self-pay | Admitting: *Deleted

## 2016-07-22 DIAGNOSIS — D638 Anemia in other chronic diseases classified elsewhere: Secondary | ICD-10-CM

## 2016-07-23 ENCOUNTER — Encounter (HOSPITAL_COMMUNITY)
Admission: RE | Admit: 2016-07-23 | Discharge: 2016-07-23 | Disposition: A | Discharge status: Home / Self Care | Payer: Managed Care, Other (non HMO) | Source: Ambulatory Visit | Attending: Nephrology | Admitting: Nephrology

## 2016-07-23 DIAGNOSIS — N182 Chronic kidney disease, stage 2 (mild): Secondary | ICD-10-CM | POA: Diagnosis not present

## 2016-07-23 LAB — POCT HEMOGLOBIN-HEMACUE: Hemoglobin: 10.4 g/dL — ABNORMAL LOW (ref 12.0–15.0)

## 2016-07-23 MED ORDER — EPOETIN ALFA 20000 UNIT/ML IJ SOLN
INTRAMUSCULAR | Status: AC
  Administered 2016-07-23: 20000 [IU] via SUBCUTANEOUS
  Filled 2016-07-23: qty 1

## 2016-07-23 MED ORDER — EPOETIN ALFA 40000 UNIT/ML IJ SOLN: 30000.0000 [IU] | INTRAMUSCULAR | Status: DC

## 2016-07-23 MED ORDER — EPOETIN ALFA 10000 UNIT/ML IJ SOLN
INTRAMUSCULAR | Status: AC
  Administered 2016-07-23: 10000 [IU] via SUBCUTANEOUS
  Filled 2016-07-23: qty 1

## 2016-08-03 ENCOUNTER — Other Ambulatory Visit: Payer: Self-pay

## 2016-08-03 DIAGNOSIS — Z01810 Encounter for preprocedural cardiovascular examination: Secondary | ICD-10-CM

## 2016-08-03 DIAGNOSIS — N184 Chronic kidney disease, stage 4 (severe): Secondary | ICD-10-CM

## 2016-08-06 ENCOUNTER — Encounter (HOSPITAL_COMMUNITY)
Admission: RE | Admit: 2016-08-06 | Discharge: 2016-08-06 | Disposition: A | Discharge status: Home / Self Care | Payer: Managed Care, Other (non HMO) | Source: Ambulatory Visit | Attending: Nephrology | Admitting: Nephrology

## 2016-08-06 DIAGNOSIS — N182 Chronic kidney disease, stage 2 (mild): Secondary | ICD-10-CM | POA: Diagnosis present

## 2016-08-06 LAB — IRON AND TIBC
IRON: 52 ug/dL (ref 28–170)
Saturation Ratios: 22 % (ref 10.4–31.8)
TIBC: 235 ug/dL — ABNORMAL LOW (ref 250–450)
UIBC: 183 ug/dL

## 2016-08-06 LAB — POCT HEMOGLOBIN-HEMACUE: HEMOGLOBIN: 11.2 g/dL — AB (ref 12.0–15.0)

## 2016-08-06 LAB — FERRITIN: FERRITIN: 756 ng/mL — AB (ref 11–307)

## 2016-08-06 MED ORDER — EPOETIN ALFA 10000 UNIT/ML IJ SOLN
INTRAMUSCULAR | Status: AC
Start: 2016-08-06 — End: 2016-08-06
  Administered 2016-08-06: 10000 [IU] via SUBCUTANEOUS
  Filled 2016-08-06: qty 1

## 2016-08-06 MED ORDER — EPOETIN ALFA 20000 UNIT/ML IJ SOLN
INTRAMUSCULAR | Status: AC
  Administered 2016-08-06: 20000 [IU] via SUBCUTANEOUS
  Filled 2016-08-06: qty 1

## 2016-08-06 MED ORDER — EPOETIN ALFA 40000 UNIT/ML IJ SOLN: 30000.0000 [IU] | INTRAMUSCULAR | Status: DC

## 2016-08-19 ENCOUNTER — Other Ambulatory Visit (HOSPITAL_COMMUNITY): Payer: Self-pay | Admitting: *Deleted

## 2016-08-20 ENCOUNTER — Encounter (HOSPITAL_COMMUNITY)
Admission: RE | Admit: 2016-08-20 | Discharge: 2016-08-20 | Disposition: A | Discharge status: Home / Self Care | Payer: Managed Care, Other (non HMO) | Source: Ambulatory Visit | Attending: Nephrology | Admitting: Nephrology

## 2016-08-20 DIAGNOSIS — D509 Iron deficiency anemia, unspecified: Secondary | ICD-10-CM | POA: Insufficient documentation

## 2016-08-20 DIAGNOSIS — N182 Chronic kidney disease, stage 2 (mild): Secondary | ICD-10-CM

## 2016-08-20 LAB — POCT HEMOGLOBIN-HEMACUE: HEMOGLOBIN: 11.6 g/dL — AB (ref 12.0–15.0)

## 2016-08-20 MED ORDER — EPOETIN ALFA 40000 UNIT/ML IJ SOLN: 30000.0000 [IU] | INTRAMUSCULAR | Status: DC

## 2016-08-20 MED ORDER — SODIUM CHLORIDE 0.9 % IV SOLN
510.0000 mg | Freq: Once | INTRAVENOUS | Status: AC
  Administered 2016-08-20: 510 mg via INTRAVENOUS
  Filled 2016-08-20: qty 17

## 2016-08-20 MED ORDER — EPOETIN ALFA 20000 UNIT/ML IJ SOLN
INTRAMUSCULAR | Status: AC
  Administered 2016-08-20: 20000 [IU] via SUBCUTANEOUS
  Filled 2016-08-20: qty 1

## 2016-08-20 MED ORDER — EPOETIN ALFA 10000 UNIT/ML IJ SOLN
INTRAMUSCULAR | Status: AC
  Administered 2016-08-20: 10000 [IU] via SUBCUTANEOUS
  Filled 2016-08-20: qty 1

## 2016-08-26 ENCOUNTER — Encounter: Payer: Self-pay | Admitting: Vascular Surgery

## 2016-08-27 ENCOUNTER — Encounter (HOSPITAL_COMMUNITY): Payer: Self-pay | Admitting: *Deleted

## 2016-08-27 ENCOUNTER — Encounter: Payer: Self-pay | Admitting: Vascular Surgery

## 2016-08-27 ENCOUNTER — Ambulatory Visit (INDEPENDENT_AMBULATORY_CARE_PROVIDER_SITE_OTHER): Payer: Managed Care, Other (non HMO) | Admitting: Vascular Surgery

## 2016-08-27 ENCOUNTER — Encounter: Payer: Self-pay | Admitting: *Deleted

## 2016-08-27 ENCOUNTER — Ambulatory Visit (INDEPENDENT_AMBULATORY_CARE_PROVIDER_SITE_OTHER)
Admission: RE | Admit: 2016-08-27 | Discharge: 2016-08-27 | Disposition: A | Discharge status: Home / Self Care | Payer: Managed Care, Other (non HMO) | Source: Ambulatory Visit | Attending: Vascular Surgery | Admitting: Vascular Surgery

## 2016-08-27 ENCOUNTER — Other Ambulatory Visit: Payer: Self-pay | Admitting: *Deleted

## 2016-08-27 ENCOUNTER — Ambulatory Visit (HOSPITAL_COMMUNITY)
Admission: RE | Admit: 2016-08-27 | Discharge: 2016-08-27 | Disposition: A | Discharge status: Home / Self Care | Payer: Managed Care, Other (non HMO) | Source: Ambulatory Visit | Attending: Vascular Surgery | Admitting: Vascular Surgery

## 2016-08-27 VITALS — BP 139/93 | HR 86 | Ht 68.0 in | Wt 228.0 lb

## 2016-08-27 DIAGNOSIS — N184 Chronic kidney disease, stage 4 (severe): Secondary | ICD-10-CM

## 2016-08-27 DIAGNOSIS — Z01810 Encounter for preprocedural cardiovascular examination: Secondary | ICD-10-CM

## 2016-08-27 NOTE — Progress Notes (Signed)
Referred by:  Ma Hillock, DO [address] [place], [place] [address]  Reason for referral: New access  History of Present Illness  Heather [name] is a 45 y.o. ([birth year]) female who presents for evaluation for permanent access.  The patient is right hand dominant.  The patient has not had previous access procedures.  Previous central venous cannulation procedures include: none.  The patient has never had a PPM placed.  The patient notes cause of  CKD likely DM and HTN.  Past Medical History:  Diagnosis Date  . Allergy   . Anemia    chronic disease  . Blood transfusion without reported diagnosis   . Chronic constipation   . Chronic kidney disease   . Diabetes mellitus   . Fibroids    leiomyoma 8/04 per [name]  . Fibromyalgia   . Gastroparesis    DM uncontrolled.   . Hypertension   . IBS (irritable bowel syndrome)   . Infertility    eval at [facility]  . Obesity   . Pancreatitis     Past Surgical History:  Procedure Laterality Date  . EYE SURGERY     bilateral, vitrectomies  . fibroid tumors removed    . FOOT SURGERY     bilateral    Social History   Social History  . Marital status: Married    Spouse name: N/A  . Number of children: 1  . Years of education: college   Occupational History  . customer service rep [employer]   Social History Main Topics  . Smoking status: Never Smoker  . Smokeless tobacco: Never Used  . Alcohol use No  . Drug use: No  . Sexual activity: Yes    Partners: Male    Birth control/ protection: None   Other Topics Concern  . Not on file   Social History Narrative   Patient lives with fiance. She has 1 grown son. Works full-time, she Paramedic for attorney.   Caffeine Use: 2 cups daily; sodas occasionally    Family History  Problem Relation Age of Onset  . Colon polyps Mother   . Diabetes Mother   . Heart murmur Father   . Hypertension Father   . Diabetes Sister   . Kidney failure Sister   . Diabetes Brother     . Retinal degeneration Brother   . Heart murmur Son   . Stroke Paternal Grandmother   . Diabetes Sister     Current Outpatient Prescriptions  Medication Sig Dispense Refill  . amLODipine (NORVASC) 10 MG tablet Take by mouth.    [name] atorvastatin (LIPITOR) 40 MG tablet Take by mouth.    . calcitRIOL (ROCALTROL) 0.25 MCG capsule Take 0.5 mcg by mouth at bedtime.     [name] doxycycline (VIBRA-TABS) 100 MG tablet Take 1 tablet (100 mg total) by mouth 2 (two) times daily. 20 tablet 0  . epoetin alfa (EPOGEN,PROCRIT) 25956 UNIT/ML injection Inject 30,000 Units into the vein every 14 (fourteen) days.     . ferumoxytol (FERAHEME) 510 MG/17ML SOLN injection Inject 1,020 mg into the vein every 14 (fourteen) days.     . fluticasone (FLONASE) 50 MCG/ACT nasal spray Place 2 sprays into both nostrils daily. 16 g 6  . furosemide (LASIX) 80 MG tablet Take 1 tablet (80 mg total) by mouth 2 (two) times daily. (Patient taking differently: Take 160 mg by mouth daily as needed for fluid. ) 60 tablet 0  . gabapentin (NEURONTIN) 300 MG  capsule Take 1 capsule (300 mg total) by mouth 2 (two) times daily as needed. 60 capsule 3  . glucose blood (ONETOUCH VERIO) test strip Test 4 times daily. (Patient taking differently: 1 each by Other route 3 (three) times daily. ) 100 each 12  . insulin detemir (LEVEMIR) 100 UNIT/ML injection Inject 0.19 mLs (19 Units total) into the skin daily. Inject at lunch time. (Patient taking differently: Inject 19 Units into the skin at bedtime. ) 10 mL 3  . insulin lispro (HUMALOG) 100 UNIT/ML injection Inject 0.1 mLs (10 Units total) into the skin 3 (three) times daily before meals. 10 mL 0  . lisinopril (PRINIVIL,ZESTRIL) 40 MG tablet Take 1 tablet (40 mg total) by mouth daily. 90 tablet 1   No current facility-administered medications for this visit.     Allergies  Allergen Reactions  . Ibuprofen Other (See Comments)    CKD stage 3. Should avoid.  . Dilaudid [Hydromorphone Hcl] Itching     Can take with benadryl    REVIEW OF SYSTEMS:   Cardiac:  positive for: no symptoms, negative for: Chest pain or chest pressure, Shortness of breath upon exertion and Shortness of breath when lying flat,   Vascular:  positive for: Pain in calf, thigh, or hip brought on by ambulation,  negative for: Pain in feet at night that wakes you up from your sleep, Blood clot in your veins and Leg swelling  Pulmonary:  positive for: no symptoms,  negative for: Oxygen at home, Productive cough and Wheezing  Neurologic:  positive for: sudden weakness and numbness in arms or legs, negative for: Sudden onset of difficulty speaking or slurred speech, Temporary loss of vision in one eye and Problems with dizziness  Gastrointestinal:  positive for: no symptoms, negative for: Blood in stool and Vomited blood  Genitourinary:  positive for: no symptoms, negative for: Burning when urinating and Blood in urine  Psychiatric:  positive for: no symptoms,  negative for: Major depression  Hematologic:  positive for: no symptoms,  negative for: negative for: Bleeding problems and Problems with blood clotting too easily  Dermatologic:  positive for: no symptoms, negative for: Rashes or ulcers  Constitutional:  positive for: no symptoms, negative for: Fever or chills   Physical Examination  Vitals:   08/27/16 0857  BP: (!) 139/93  Pulse: 86  SpO2: 98%  Weight: 228 lb (103.4 kg)  Height: 5\' 8"  (1.727 m)    Body mass index is 34.67 kg/m.  General: Alert, O x 3, WD,NAD  Head: [place]/AT,   Ear/Nose/Throat: Hearing grossly intact, nares without erythema or drainage, oropharynx without Erythema or Exudate , Mallampati score: 3, Dentition intact  Eyes: PERRLA, EOMI,   Neck: Supple, mid-line trachea,    Pulmonary: Sym exp, good B air movt,CTA B  Cardiac: RRR, Nl S1, S2, no Murmurs, No rubs, No S3,S4  Vascular: Vessel Right Left  Radial Palpable Palpable  Brachial Palpable Palpable   Carotid Palpable, No Bruit Palpable, No Bruit  Aorta Not palpable N/A  Femoral Palpable Palpable  Popliteal Not palpable Not palpable  PT Palpable Palpable  DP Palpable Palpable   Gastrointestinal: soft, non-distended, non-tender to palpation, No guarding or rebound, no HSM, no masses, no CVAT B, No palpable prominent aortic pulse,    Musculoskeletal: M/S 5/5 throughout  , Extremities without ischemic changes  , No edema present,  , No LDS present  Neurologic: CN 2-12 intact , Pain and light touch intact in extremities , Motor exam as listed  above  Psychiatric: Judgement intact, Mood & affect appropriate for pt's clinical situation  Dermatologic: See M/S exam for extremity exam, No rashes otherwise noted  Lymph : Palpable lymph nodes: None   Non-Invasive Vascular Imaging  Vein Mapping  (Date: 08/27/2016):   R arm: acceptable vein conduits include none  L arm: acceptable vein conduits include upper arm basilic  BUE Doppler (Date: 08/27/2016):   R arm:   Brachial: tri, 3.9 mm  Radial: tri, 2.0 mm  Ulnar: tri, 3.0 mm  L arm:   Brachial: tri, 3.6 mm  Radial: tri, 2.0 mm  Ulnar: tri, 2.0 mm   Outside Studies/Documentation 3 pages of outside documents were reviewed including: outpatient nephrology chart.   Medical Decision Making Heather [name] is a 45 y.o. female who presents with chronic kidney disease stage IV   Based on vein mapping and examination, this patient's permanent access options include: L staged BVT  I had an extensive discussion with this patient in regards to the nature of access surgery, including risk, benefits, and alternatives.    The patient is aware that the risks of access surgery include but are not limited to: bleeding, infection, steal syndrome, nerve damage, ischemic monomelic neuropathy, failure of access to mature, and possible need for additional access procedures in the future. I discussed with the patient the nature of the staged  access procedure, specifically the need for a second operation to transpose the first stage fistula if it matures adequately.    The patient has agreed to proceed with the above procedure which will be scheduled 5 SEP 17.   Adele Barthel, MD [facility] Office: [phone] Pager: [phone]  08/27/2016, 8:19 AM

## 2016-08-27 NOTE — Progress Notes (Signed)
I instructed patient to check CBG to check CBG and if it is less than 70 to treat it with Glucose Gel, Glucose tablets or 1/2 cup of clear juice like apple juice or cranberry juice, or 1/2 cup of regular soda. (not cream soda). I instructed patient to recheck CBG in 15 minutes and if CBG is not greater than 70, to  Call [phone] (pre- op). If it is before pre-op opens to retreat as before and recheck CBG in 15 minutes. I told patient to make note of time that liquid is taken and amount, that surgical time may have to be adjusted.   I instructed patient if CBG > 220 to take 1/2 of  Novolog SS .  Patient takes [place] at lunch time, so this will not be changed.

## 2016-08-31 ENCOUNTER — Ambulatory Visit (HOSPITAL_COMMUNITY)
Admission: RE | Admit: 2016-08-31 | Discharge: 2016-08-31 | Disposition: A | Discharge status: Home / Self Care | Payer: Managed Care, Other (non HMO) | Source: Ambulatory Visit | Attending: Vascular Surgery | Admitting: Vascular Surgery

## 2016-08-31 ENCOUNTER — Ambulatory Visit (HOSPITAL_COMMUNITY): Payer: Managed Care, Other (non HMO) | Admitting: Anesthesiology

## 2016-08-31 ENCOUNTER — Encounter (HOSPITAL_COMMUNITY): Payer: Self-pay | Admitting: Anesthesiology

## 2016-08-31 ENCOUNTER — Encounter (HOSPITAL_COMMUNITY)
Admission: RE | Disposition: A | Discharge status: Home / Self Care | Payer: Self-pay | Source: Ambulatory Visit | Attending: Vascular Surgery

## 2016-08-31 DIAGNOSIS — M797 Fibromyalgia: Secondary | ICD-10-CM | POA: Insufficient documentation

## 2016-08-31 DIAGNOSIS — Z794 Long term (current) use of insulin: Secondary | ICD-10-CM | POA: Diagnosis not present

## 2016-08-31 DIAGNOSIS — Z6834 Body mass index (BMI) 34.0-34.9, adult: Secondary | ICD-10-CM | POA: Insufficient documentation

## 2016-08-31 DIAGNOSIS — E669 Obesity, unspecified: Secondary | ICD-10-CM | POA: Diagnosis not present

## 2016-08-31 DIAGNOSIS — N184 Chronic kidney disease, stage 4 (severe): Secondary | ICD-10-CM | POA: Insufficient documentation

## 2016-08-31 DIAGNOSIS — E1122 Type 2 diabetes mellitus with diabetic chronic kidney disease: Secondary | ICD-10-CM | POA: Diagnosis not present

## 2016-08-31 DIAGNOSIS — I129 Hypertensive chronic kidney disease with stage 1 through stage 4 chronic kidney disease, or unspecified chronic kidney disease: Secondary | ICD-10-CM | POA: Insufficient documentation

## 2016-08-31 HISTORY — DX: Headache, unspecified: R51.9

## 2016-08-31 HISTORY — PX: BASCILIC VEIN TRANSPOSITION: SHX5742

## 2016-08-31 LAB — GLUCOSE, CAPILLARY
GLUCOSE-CAPILLARY: 166 mg/dL — AB (ref 65–99)
Glucose-Capillary: 217 mg/dL — ABNORMAL HIGH (ref 65–99)

## 2016-08-31 LAB — POCT I-STAT 4, (NA,K, GLUC, HGB,HCT)
GLUCOSE: 201 mg/dL — AB (ref 65–99)
HEMATOCRIT: 38 % (ref 36.0–46.0)
HEMOGLOBIN: 12.9 g/dL (ref 12.0–15.0)
POTASSIUM: 4.2 mmol/L (ref 3.5–5.1)
SODIUM: 140 mmol/L (ref 135–145)

## 2016-08-31 SURGERY — TRANSPOSITION, VEIN, BASILIC
Anesthesia: Monitor Anesthesia Care | Site: Arm Upper | Laterality: Left

## 2016-08-31 MED ORDER — DEXTROSE 5 % IV SOLN
1.5000 g | INTRAVENOUS | Status: AC
  Administered 2016-08-31: 1.5 g via INTRAVENOUS

## 2016-08-31 MED ORDER — DEXTROSE 5 % IV SOLN
INTRAVENOUS | Status: AC
  Filled 2016-08-31: qty 1.5

## 2016-08-31 MED ORDER — ONDANSETRON HCL 4 MG/2ML IJ SOLN
INTRAMUSCULAR | Status: AC
  Filled 2016-08-31: qty 2

## 2016-08-31 MED ORDER — LIDOCAINE-EPINEPHRINE (PF) 1 %-1:200000 IJ SOLN
INTRAMUSCULAR | Status: DC | PRN
  Administered 2016-08-31: 30 mL

## 2016-08-31 MED ORDER — 0.9 % SODIUM CHLORIDE (POUR BTL) OPTIME
TOPICAL | Status: DC | PRN
  Administered 2016-08-31: 1000 mL

## 2016-08-31 MED ORDER — SODIUM CHLORIDE 0.9 % IV SOLN
INTRAVENOUS | Status: DC | PRN
  Administered 2016-08-31: 07:00:00

## 2016-08-31 MED ORDER — OXYCODONE-ACETAMINOPHEN 5-325 MG PO TABS: 1.0000 | ORAL_TABLET | Freq: Four times a day (QID) | ORAL | 0 refills | Status: DC | PRN

## 2016-08-31 MED ORDER — SODIUM CHLORIDE 0.9 % IV SOLN
INTRAVENOUS | Status: DC
  Administered 2016-08-31: 08:00:00 via INTRAVENOUS

## 2016-08-31 MED ORDER — MIDAZOLAM HCL 2 MG/2ML IJ SOLN
INTRAMUSCULAR | Status: AC
  Filled 2016-08-31: qty 2

## 2016-08-31 MED ORDER — PROPOFOL 500 MG/50ML IV EMUL
INTRAVENOUS | Status: DC | PRN
  Administered 2016-08-31: 50 ug/kg/min via INTRAVENOUS

## 2016-08-31 MED ORDER — CHLORHEXIDINE GLUCONATE CLOTH 2 % EX PADS: 6.0000 | MEDICATED_PAD | Freq: Once | CUTANEOUS | Status: DC

## 2016-08-31 MED ORDER — FENTANYL CITRATE (PF) 100 MCG/2ML IJ SOLN
INTRAMUSCULAR | Status: AC
  Filled 2016-08-31: qty 2

## 2016-08-31 MED ORDER — FENTANYL CITRATE (PF) 100 MCG/2ML IJ SOLN
INTRAMUSCULAR | Status: DC | PRN
  Administered 2016-08-31 (×2): 50 ug via INTRAVENOUS

## 2016-08-31 MED ORDER — LIDOCAINE-EPINEPHRINE (PF) 1 %-1:200000 IJ SOLN
INTRAMUSCULAR | Status: AC
  Filled 2016-08-31: qty 30

## 2016-08-31 MED ORDER — PROPOFOL 10 MG/ML IV BOLUS
INTRAVENOUS | Status: AC
  Filled 2016-08-31: qty 40

## 2016-08-31 MED ORDER — MIDAZOLAM HCL 5 MG/5ML IJ SOLN
INTRAMUSCULAR | Status: DC | PRN
  Administered 2016-08-31 (×2): 1 mg via INTRAVENOUS

## 2016-08-31 MED ORDER — ONDANSETRON HCL 4 MG/2ML IJ SOLN
INTRAMUSCULAR | Status: DC | PRN
  Administered 2016-08-31: 4 mg via INTRAVENOUS

## 2016-08-31 SURGICAL SUPPLY — 35 items
ARMBAND PINK RESTRICT EXTREMIT (MISCELLANEOUS) ×2 IMPLANT
CANISTER SUCTION 2500CC (MISCELLANEOUS) ×2 IMPLANT
CLIP TI MEDIUM 24 (CLIP) ×2 IMPLANT
CLIP TI WIDE RED SMALL 24 (CLIP) ×2 IMPLANT
CORDS BIPOLAR (ELECTRODE) IMPLANT
COVER PROBE W GEL 5X96 (DRAPES) ×2 IMPLANT
DECANTER SPIKE VIAL GLASS SM (MISCELLANEOUS) IMPLANT
ELECT REM PT RETURN 9FT ADLT (ELECTROSURGICAL) ×2
ELECTRODE REM PT RTRN 9FT ADLT (ELECTROSURGICAL) ×1 IMPLANT
GLOVE BIO SURGEON STRL SZ 6.5 (GLOVE) ×4 IMPLANT
GLOVE BIO SURGEON STRL SZ7 (GLOVE) ×2 IMPLANT
GLOVE BIOGEL PI IND STRL 6.5 (GLOVE) ×2 IMPLANT
GLOVE BIOGEL PI IND STRL 7.5 (GLOVE) ×2 IMPLANT
GLOVE BIOGEL PI INDICATOR 6.5 (GLOVE) ×2
GLOVE BIOGEL PI INDICATOR 7.5 (GLOVE) ×2
GLOVE ECLIPSE 7.0 STRL STRAW (GLOVE) ×4 IMPLANT
GOWN STRL REUS W/ TWL LRG LVL3 (GOWN DISPOSABLE) ×3 IMPLANT
GOWN STRL REUS W/TWL LRG LVL3 (GOWN DISPOSABLE) ×3
HEMOSTAT SPONGE AVITENE ULTRA (HEMOSTASIS) IMPLANT
KIT BASIN OR (CUSTOM PROCEDURE TRAY) ×2 IMPLANT
KIT ROOM TURNOVER OR (KITS) ×2 IMPLANT
LIQUID BAND (GAUZE/BANDAGES/DRESSINGS) ×2 IMPLANT
NS IRRIG 1000ML POUR BTL (IV SOLUTION) ×2 IMPLANT
PACK CV ACCESS (CUSTOM PROCEDURE TRAY) ×2 IMPLANT
PAD ARMBOARD 7.5X6 YLW CONV (MISCELLANEOUS) ×4 IMPLANT
SUT MNCRL AB 4-0 PS2 18 (SUTURE) ×2 IMPLANT
SUT PROLENE 6 0 BV (SUTURE) ×2 IMPLANT
SUT PROLENE 7 0 BV 1 (SUTURE) ×2 IMPLANT
SUT SILK 2 0 SH (SUTURE) IMPLANT
SUT VIC AB 2-0 CT1 27 (SUTURE) ×1
SUT VIC AB 2-0 CT1 TAPERPNT 27 (SUTURE) ×1 IMPLANT
SUT VIC AB 3-0 SH 27 (SUTURE) ×1
SUT VIC AB 3-0 SH 27X BRD (SUTURE) ×1 IMPLANT
UNDERPAD 30X30 (UNDERPADS AND DIAPERS) ×2 IMPLANT
WATER STERILE IRR 1000ML POUR (IV SOLUTION) ×2 IMPLANT

## 2016-08-31 NOTE — Transfer of Care (Signed)
Immediate Anesthesia Transfer of Care Note  Patient: Heather [name]  Procedure(s) Performed: Procedure(s): BASILIC VEIN TRANSPOSITION FIRST STAGE (Left)  Patient Location: PACU  Anesthesia Type:MAC  Level of Consciousness: awake, alert , oriented and patient cooperative  Airway & Oxygen Therapy: Patient Spontanous Breathing and Patient connected to nasal cannula oxygen  Post-op Assessment: Report given to RN, Post -op Vital signs reviewed and stable and Patient moving all extremities  Post vital signs: Reviewed and stable  Last Vitals:  Vitals:   08/31/16 0600  Pulse: 81  Resp: 20  Temp: 36.6 C    Last Pain:  Vitals:   08/31/16 0600  TempSrc: Oral      Patients Stated Pain Goal: 1 ([phone] [phone])  Complications: No apparent anesthesia complications

## 2016-08-31 NOTE — Op Note (Addendum)
OPERATIVE NOTE   PROCEDURE: 1. left first stage brachial vein transposition (brachiobrachial arteriovenous fistula) placement  PRE-OPERATIVE DIAGNOSIS: chronic kidney disease stage IV   POST-OPERATIVE DIAGNOSIS: same as above   SURGEON: Adele Barthel, MD  ASSISTANT(S): Gerri Lins, PAC   ANESTHESIA: local and MAC  ESTIMATED BLOOD LOSS: 50 cc  FINDING(S): 1. Cephalic vein near occluded with <1 mm lumen 2. Forearm basilic vein <2 mm (segment of adequate vein is too short for use as a transposition) 3. Superficial brachial vein 3.0-3.5 mm 4. Faintly thrill in brachial vein transposition  5. Dopplerable radial signal at end of case  SPECIMEN(S):  none  INDICATIONS:   Heather [name] is a 45 y.o. female who presents with chronic kidney disease stage IV.  The patient is scheduled for left first stage basilic vs. brachial vein transposition.  The patient is aware the risks include but are not limited to: bleeding, infection, steal syndrome, nerve damage, ischemic monomelic neuropathy, failure to mature, and need for additional procedures.  The patient is aware of the risks of the procedure and elects to proceed forward.   DESCRIPTION: After full informed written consent was obtained from the patient, the patient was brought back to the operating room and placed supine upon the operating table.  Prior to induction, the patient received IV antibiotics.   After obtaining adequate anesthesia, the patient was then prepped and draped in the standard fashion for a left arm access procedure.  I turned my attention first to identifying the patient's cephalic vein, basilic vein, brachial vein and brachial artery.  Using SonoSite guidance, the location of these vessels were marked out on the skin.   After further evaluation, I felt none of the vein except for the brachial vein was useable.  At this point, I injected local anesthetic to obtain a field block of the antecubitum.  In total, I  injected about 5 mL 1% lidocaine with epinephrine.  I made a longitudinal incision at the level of the antecubitum and dissected through the subcutaneous tissue and fascia to gain exposure of the brachial artery.  This was noted to be 3 mm in diameter externally.  This was dissected out proximally and distally and controlled with vessel loops .  I then dissected out the brachial vein.  This was noted to be 3.0-3.5 mm in diameter externally.  The distal segment of the vein was ligated with a  2-0 silk, and the vein was transected.  The proximal segment was interrogated with serial dilators.  The vein accepted up to a 3.5 mm dilator without any difficulty.  I then instilled the heparinized saline into the vein and clamped it.  At this point, I reset my exposure of the brachial artery and placed the artery under tension proximally and distally.  I made an arteriotomy with a #11 blade, and then I extended the arteriotomy with a Potts scissor.  I injected heparinized saline proximal and distal to this arteriotomy.  The vein was then sewn to the artery in an end-to-side configuration with a running stitch of 7-0 Prolene.  Prior to completing this anastomosis, I allowed the vein and artery to backbleed.  There was no evidence of clot from any vessels.  I completed the anastomosis in the usual fashion and then released all vessel loops and clamps.  There was a faintly palpable thrill in the venous outflow, and there was a dopplerable radial signal.  I expect this fistula to improve with resolution of the spasm  in the brachial artery.  At this point, I irrigated out the surgical wound.  There was no further active bleeding.  The subcutaneous tissue was reapproximated with a running stitch of 3-0 Vicryl.  The skin was then reapproximated with a running subcuticular stitch of 4-0 Vicryl.  The skin was then cleaned, dried, and reinforced with Liquidband.  The patient tolerated this procedure well.    COMPLICATIONS:  none  CONDITION: stable  Adele Barthel, MD, [facility] [place] Office: [phone] Pager: [phone]  08/31/2016, 8:48 AM

## 2016-08-31 NOTE — Anesthesia Preprocedure Evaluation (Addendum)
Anesthesia Evaluation  Patient identified by MRN, date of birth, ID band Patient awake    Reviewed: Allergy & Precautions, NPO status , Patient's Chart, lab work & pertinent test results  History of Anesthesia Complications Negative for: history of anesthetic complications  Airway Mallampati: II  TM Distance: >3 FB Neck ROM: Full    Dental  (+) Teeth Intact, Chipped, Dental Advisory Given,    Pulmonary neg pulmonary ROS,    breath sounds clear to auscultation       Cardiovascular hypertension, Pt. on medications  Rhythm:Regular  Took amlodipine this morning with sip of water   Neuro/Psych  Headaches,  Neuromuscular disease negative psych ROS   GI/Hepatic negative GI ROS, Neg liver ROS,   Endo/Other  diabetes, Poorly Controlled, Type 2, Insulin Dependent  Renal/GU CRFRenal disease     Musculoskeletal  (+) Fibromyalgia -  Abdominal   Peds  Hematology  (+) anemia ,   Anesthesia Other Findings   Reproductive/Obstetrics                            Anesthesia Physical Anesthesia Plan  ASA: III  Anesthesia Plan: MAC   Post-op Pain Management:    Induction: Intravenous  Airway Management Planned: Natural Airway, Nasal Cannula and Simple Face Mask  Additional Equipment: None  Intra-op Plan:   Post-operative Plan:   Informed Consent: I have reviewed the patients History and Physical, chart, labs and discussed the procedure including the risks, benefits and alternatives for the proposed anesthesia with the patient or authorized representative who has indicated his/her understanding and acceptance.   Dental advisory given  Plan Discussed with: CRNA and Surgeon  Anesthesia Plan Comments:         Anesthesia Quick Evaluation

## 2016-08-31 NOTE — H&P (View-Only) (Signed)
Referred by:  Ma Hillock, DO [address] [place], [place] [address]  Reason for referral: New access  History of Present Illness  Heather [name] is a 45 y.o. (1971/10/17) female who presents for evaluation for permanent access.  The patient is right hand dominant.  The patient has not had previous access procedures.  Previous central venous cannulation procedures include: none.  The patient has never had a PPM placed.  The patient notes cause of  CKD likely DM and HTN.  Past Medical History:  Diagnosis Date  . Allergy   . Anemia    chronic disease  . Blood transfusion without reported diagnosis   . Chronic constipation   . Chronic kidney disease   . Diabetes mellitus   . Fibroids    leiomyoma 8/04 per [name]  . Fibromyalgia   . Gastroparesis    DM uncontrolled.   . Hypertension   . IBS (irritable bowel syndrome)   . Infertility    eval at [facility]  . Obesity   . Pancreatitis     Past Surgical History:  Procedure Laterality Date  . EYE SURGERY     bilateral, vitrectomies  . fibroid tumors removed    . FOOT SURGERY     bilateral    Social History   Social History  . Marital status: Married    Spouse name: N/A  . Number of children: 1  . Years of education: college   Occupational History  . customer service rep [employer]   Social History Main Topics  . Smoking status: Never Smoker  . Smokeless tobacco: Never Used  . Alcohol use No  . Drug use: No  . Sexual activity: Yes    Partners: Male    Birth control/ protection: None   Other Topics Concern  . Not on file   Social History Narrative   Patient lives with fiance. She has 1 grown son. Works full-time, she Paramedic for attorney.   Caffeine Use: 2 cups daily; sodas occasionally    Family History  Problem Relation Age of Onset  . Colon polyps Mother   . Diabetes Mother   . Heart murmur Father   . Hypertension Father   . Diabetes Sister   . Kidney failure Sister   . Diabetes Brother     . Retinal degeneration Brother   . Heart murmur Son   . Stroke Paternal Grandmother   . Diabetes Sister     Current Outpatient Prescriptions  Medication Sig Dispense Refill  . amLODipine (NORVASC) 10 MG tablet Take by mouth.    [name] atorvastatin (LIPITOR) 40 MG tablet Take by mouth.    . calcitRIOL (ROCALTROL) 0.25 MCG capsule Take 0.5 mcg by mouth at bedtime.     [name] doxycycline (VIBRA-TABS) 100 MG tablet Take 1 tablet (100 mg total) by mouth 2 (two) times daily. 20 tablet 0  . epoetin alfa (EPOGEN,PROCRIT) 57846 UNIT/ML injection Inject 30,000 Units into the vein every 14 (fourteen) days.     . ferumoxytol (FERAHEME) 510 MG/17ML SOLN injection Inject 1,020 mg into the vein every 14 (fourteen) days.     . fluticasone (FLONASE) 50 MCG/ACT nasal spray Place 2 sprays into both nostrils daily. 16 g 6  . furosemide (LASIX) 80 MG tablet Take 1 tablet (80 mg total) by mouth 2 (two) times daily. (Patient taking differently: Take 160 mg by mouth daily as needed for fluid. ) 60 tablet 0  . gabapentin (NEURONTIN) 300 MG  capsule Take 1 capsule (300 mg total) by mouth 2 (two) times daily as needed. 60 capsule 3  . glucose blood (ONETOUCH VERIO) test strip Test 4 times daily. (Patient taking differently: 1 each by Other route 3 (three) times daily. ) 100 each 12  . insulin detemir (LEVEMIR) 100 UNIT/ML injection Inject 0.19 mLs (19 Units total) into the skin daily. Inject at lunch time. (Patient taking differently: Inject 19 Units into the skin at bedtime. ) 10 mL 3  . insulin lispro (HUMALOG) 100 UNIT/ML injection Inject 0.1 mLs (10 Units total) into the skin 3 (three) times daily before meals. 10 mL 0  . lisinopril (PRINIVIL,ZESTRIL) 40 MG tablet Take 1 tablet (40 mg total) by mouth daily. 90 tablet 1   No current facility-administered medications for this visit.     Allergies  Allergen Reactions  . Ibuprofen Other (See Comments)    CKD stage 3. Should avoid.  . Dilaudid [Hydromorphone Hcl] Itching     Can take with benadryl    REVIEW OF SYSTEMS:   Cardiac:  positive for: no symptoms, negative for: Chest pain or chest pressure, Shortness of breath upon exertion and Shortness of breath when lying flat,   Vascular:  positive for: Pain in calf, thigh, or hip brought on by ambulation,  negative for: Pain in feet at night that wakes you up from your sleep, Blood clot in your veins and Leg swelling  Pulmonary:  positive for: no symptoms,  negative for: Oxygen at home, Productive cough and Wheezing  Neurologic:  positive for: sudden weakness and numbness in arms or legs, negative for: Sudden onset of difficulty speaking or slurred speech, Temporary loss of vision in one eye and Problems with dizziness  Gastrointestinal:  positive for: no symptoms, negative for: Blood in stool and Vomited blood  Genitourinary:  positive for: no symptoms, negative for: Burning when urinating and Blood in urine  Psychiatric:  positive for: no symptoms,  negative for: Major depression  Hematologic:  positive for: no symptoms,  negative for: negative for: Bleeding problems and Problems with blood clotting too easily  Dermatologic:  positive for: no symptoms, negative for: Rashes or ulcers  Constitutional:  positive for: no symptoms, negative for: Fever or chills   Physical Examination  Vitals:   08/27/16 0857  BP: (!) 139/93  Pulse: 86  SpO2: 98%  Weight: 228 lb (103.4 kg)  Height: 5\' 8"  (1.727 m)    Body mass index is 34.67 kg/m.  General: Alert, O x 3, WD,NAD  Head: [place]/AT,   Ear/Nose/Throat: Hearing grossly intact, nares without erythema or drainage, oropharynx without Erythema or Exudate , Mallampati score: 3, Dentition intact  Eyes: PERRLA, EOMI,   Neck: Supple, mid-line trachea,    Pulmonary: Sym exp, good B air movt,CTA B  Cardiac: RRR, Nl S1, S2, no Murmurs, No rubs, No S3,S4  Vascular: Vessel Right Left  Radial Palpable Palpable  Brachial Palpable Palpable   Carotid Palpable, No Bruit Palpable, No Bruit  Aorta Not palpable N/A  Femoral Palpable Palpable  Popliteal Not palpable Not palpable  PT Palpable Palpable  DP Palpable Palpable   Gastrointestinal: soft, non-distended, non-tender to palpation, No guarding or rebound, no HSM, no masses, no CVAT B, No palpable prominent aortic pulse,    Musculoskeletal: M/S 5/5 throughout  , Extremities without ischemic changes  , No edema present,  , No LDS present  Neurologic: CN 2-12 intact , Pain and light touch intact in extremities , Motor exam as listed  above  Psychiatric: Judgement intact, Mood & affect appropriate for pt's clinical situation  Dermatologic: See M/S exam for extremity exam, No rashes otherwise noted  Lymph : Palpable lymph nodes: None   Non-Invasive Vascular Imaging  Vein Mapping  (Date: 08/27/2016):   R arm: acceptable vein conduits include none  L arm: acceptable vein conduits include upper arm basilic  BUE Doppler (Date: 08/27/2016):   R arm:   Brachial: tri, 3.9 mm  Radial: tri, 2.0 mm  Ulnar: tri, 3.0 mm  L arm:   Brachial: tri, 3.6 mm  Radial: tri, 2.0 mm  Ulnar: tri, 2.0 mm   Outside Studies/Documentation 3 pages of outside documents were reviewed including: outpatient nephrology chart.   Medical Decision Making Heather [name] is a 45 y.o. female who presents with chronic kidney disease stage IV   Based on vein mapping and examination, this patient's permanent access options include: L staged BVT  I had an extensive discussion with this patient in regards to the nature of access surgery, including risk, benefits, and alternatives.    The patient is aware that the risks of access surgery include but are not limited to: bleeding, infection, steal syndrome, nerve damage, ischemic monomelic neuropathy, failure of access to mature, and possible need for additional access procedures in the future. I discussed with the patient the nature of the staged  access procedure, specifically the need for a second operation to transpose the first stage fistula if it matures adequately.    The patient has agreed to proceed with the above procedure which will be scheduled 5 SEP 17.   Adele Barthel, MD [facility] Office: [phone] Pager: [phone]  08/27/2016, 8:19 AM

## 2016-08-31 NOTE — Interval H&P Note (Signed)
History and Physical Interval Note:  08/31/2016 7:11 AM  Heather [name]  has presented today for surgery, with the diagnosis of Chronic kidney disease   The various methods of treatment have been discussed with the patient and family. After consideration of risks, benefits and other options for treatment, the patient has consented to  Procedure(s): BASILIC VEIN TRANSPOSITION FIRST STAGE (Left) as a surgical intervention .  The patient's history has been reviewed, patient examined, no change in status, stable for surgery.  I have reviewed the patient's chart and labs.  Questions were answered to the patient's satisfaction.     Adele Barthel

## 2016-09-01 ENCOUNTER — Encounter (HOSPITAL_COMMUNITY): Payer: Self-pay | Admitting: Vascular Surgery

## 2016-09-02 ENCOUNTER — Encounter: Payer: Self-pay | Admitting: *Deleted

## 2016-09-02 ENCOUNTER — Telehealth: Payer: Self-pay | Admitting: Vascular Surgery

## 2016-09-02 NOTE — Telephone Encounter (Signed)
LM on VM about appt on 10/15/16 @ 8:30 am, 09/02/16 beg

## 2016-09-02 NOTE — Telephone Encounter (Signed)
-----   Message from Denman George, RN sent at 08/31/2016  1:00 PM EDT ----- Regarding: needs 6 week f/u with Dr. [name]   ----- Message ----- From: Conrad Pine, MD Sent: 08/31/2016   8:52 AM To: [facility] [address]  Heather [name] [id number] 1971/11/28  PROCEDURE 1. left first stage brachial vein transposition (brachiobrachial arteriovenous fistula) placement  Asst: Gerri Lins, [facility]   Follow-up: 6 weeks

## 2016-09-03 ENCOUNTER — Encounter (HOSPITAL_COMMUNITY)
Admission: RE | Admit: 2016-09-03 | Discharge: 2016-09-03 | Disposition: A | Discharge status: Home / Self Care | Payer: Managed Care, Other (non HMO) | Source: Ambulatory Visit | Attending: Nephrology | Admitting: Nephrology

## 2016-09-03 DIAGNOSIS — N182 Chronic kidney disease, stage 2 (mild): Secondary | ICD-10-CM | POA: Diagnosis present

## 2016-09-03 LAB — POCT HEMOGLOBIN-HEMACUE: Hemoglobin: 12.7 g/dL (ref 12.0–15.0)

## 2016-09-03 LAB — IRON AND TIBC
IRON: 56 ug/dL (ref 28–170)
Saturation Ratios: 29 % (ref 10.4–31.8)
TIBC: 190 ug/dL — AB (ref 250–450)
UIBC: 134 ug/dL

## 2016-09-03 LAB — FERRITIN: FERRITIN: 980 ng/mL — AB (ref 11–307)

## 2016-09-03 MED ORDER — EPOETIN ALFA 40000 UNIT/ML IJ SOLN: 30000.0000 [IU] | INTRAMUSCULAR | Status: DC

## 2016-09-06 NOTE — Anesthesia Postprocedure Evaluation (Signed)
Anesthesia Post Note  Patient: Heather [name]  Procedure(s) Performed: Procedure(s) (LRB): BASILIC VEIN TRANSPOSITION FIRST STAGE (Left)  Patient location during evaluation: PACU Anesthesia Type: MAC Level of consciousness: awake Pain management: pain level controlled Vital Signs Assessment: post-procedure vital signs reviewed and stable Respiratory status: spontaneous breathing Cardiovascular status: stable Postop Assessment: no signs of nausea or vomiting Anesthetic complications: no    Last Vitals:  Vitals:   08/31/16 0905 08/31/16 0928  BP: (!) 141/79 (!) 156/98  Pulse:    Resp:    Temp: 36.4 C     Last Pain:  Vitals:   08/31/16 0600  TempSrc: Oral                 Arish Redner

## 2016-09-15 ENCOUNTER — Encounter: Payer: Self-pay | Admitting: Vascular Surgery

## 2016-09-15 ENCOUNTER — Ambulatory Visit (INDEPENDENT_AMBULATORY_CARE_PROVIDER_SITE_OTHER): Payer: Self-pay | Admitting: Vascular Surgery

## 2016-09-15 VITALS — BP 141/92 | HR 88 | Temp 97.7°F | Ht 68.0 in | Wt 227.5 lb

## 2016-09-15 DIAGNOSIS — N186 End stage renal disease: Secondary | ICD-10-CM

## 2016-09-15 DIAGNOSIS — Z992 Dependence on renal dialysis: Secondary | ICD-10-CM

## 2016-09-15 MED ORDER — CEPHALEXIN 500 MG PO CAPS: 500.0000 mg | ORAL_CAPSULE | Freq: Three times a day (TID) | ORAL | 0 refills | Status: DC

## 2016-09-15 NOTE — Progress Notes (Signed)
  POST OPERATIVE OFFICE NOTE    CC:  F/u for surgery first stage BVT  HPI:  This is a 45 y.o. female who reports pain and swelling at the incision site.  She also states there was a small am out of purulent drainage yesterday.  She has had no fever or chills.    Allergies  Allergen Reactions  . Ibuprofen Other (See Comments)    CKD stage 3. Should avoid.  . Dilaudid [Hydromorphone Hcl] Itching and Other (See Comments)    Can take with benadryl    Current Outpatient Prescriptions  Medication Sig Dispense Refill  . amLODipine (NORVASC) 10 MG tablet Take 10 mg by mouth daily.     [name] atorvastatin (LIPITOR) 40 MG tablet Take 40 mg by mouth daily.     . calcitRIOL (ROCALTROL) 0.25 MCG capsule Take 0.5 mcg by mouth at bedtime.     [name] epoetin alfa (EPOGEN,PROCRIT) 16109 UNIT/ML injection Inject 30,000 Units into the vein every 14 (fourteen) days.     . furosemide (LASIX) 80 MG tablet Take 1 tablet (80 mg total) by mouth 2 (two) times daily. (Patient taking differently: Take 160 mg by mouth daily as needed for fluid. ) 60 tablet 0  . gabapentin (NEURONTIN) 300 MG capsule Take 1 capsule (300 mg total) by mouth 2 (two) times daily as needed. (Patient taking differently: Take 900 mg by mouth daily. ) 60 capsule 3  . glucose blood (ONETOUCH VERIO) test strip Test 4 times daily. (Patient taking differently: 1 each by Other route 3 (three) times daily. ) 100 each 12  . insulin aspart (NOVOLOG) 100 UNIT/ML FlexPen Inject 10-15 Units into the skin See admin instructions. Inject 10 units subque in the morning and inject 15 units subqu at lunch and dinner    . Insulin Degludec 100 UNIT/ML SOPN Inject 26 Units into the skin daily.    . insulin detemir (LEVEMIR) 100 UNIT/ML injection Inject 0.19 mLs (19 Units total) into the skin daily. Inject at lunch time. (Patient taking differently: Inject 19 Units into the skin at bedtime. ) 10 mL 3  . lisinopril (PRINIVIL,ZESTRIL) 40 MG tablet Take 1 tablet (40 mg total)  by mouth daily. 90 tablet 1  . oxyCODONE-acetaminophen (PERCOCET/ROXICET) 5-325 MG tablet Take 1 tablet by mouth every 6 (six) hours as needed. 6 tablet 0  . cephALEXin (KEFLEX) 500 MG capsule Take 1 capsule (500 mg total) by mouth 3 (three) times daily. 21 capsule 0   No current facility-administered medications for this visit.      ROS:  See HPI  Physical Exam:  Vitals:   09/15/16 1324  BP: (!) 141/92  Pulse: 88  Temp: 97.7 F (36.5 C)    Incision:  Stitch abscess, superficial incision separation.  No erythema, minimal edema at incision, soft without frank hematoma. Extremities:  Grip 5/5, palpable radial pulses left UE.   Assessment/Plan:  This is a 45 y.o. female who is s/p: Fist stage BVT  There is no sign of infection at the incision site today, no erythema and she has a normal temp.  There is no foreign material in her arm.  I will place her on oral keflex 500 mg TID for 7 days and have her follow up next week with Dr. [name].     Laurence Slate [facility] PA-C [facility] [phone]

## 2016-09-17 ENCOUNTER — Encounter (HOSPITAL_COMMUNITY)
Admission: RE | Admit: 2016-09-17 | Discharge: 2016-09-17 | Disposition: A | Discharge status: Home / Self Care | Payer: Managed Care, Other (non HMO) | Source: Ambulatory Visit | Attending: Nephrology | Admitting: Nephrology

## 2016-09-17 DIAGNOSIS — N182 Chronic kidney disease, stage 2 (mild): Secondary | ICD-10-CM | POA: Diagnosis not present

## 2016-09-17 LAB — POCT HEMOGLOBIN-HEMACUE: Hemoglobin: 12.9 g/dL (ref 12.0–15.0)

## 2016-09-17 MED ORDER — EPOETIN ALFA 40000 UNIT/ML IJ SOLN: 30000.0000 [IU] | INTRAMUSCULAR | Status: DC

## 2016-10-01 ENCOUNTER — Encounter (HOSPITAL_COMMUNITY)
Admission: RE | Admit: 2016-10-01 | Discharge: 2016-10-01 | Disposition: A | Discharge status: Home / Self Care | Payer: Managed Care, Other (non HMO) | Source: Ambulatory Visit | Attending: Nephrology | Admitting: Nephrology

## 2016-10-01 DIAGNOSIS — N182 Chronic kidney disease, stage 2 (mild): Secondary | ICD-10-CM | POA: Insufficient documentation

## 2016-10-01 LAB — POCT HEMOGLOBIN-HEMACUE: Hemoglobin: 10.4 g/dL — ABNORMAL LOW (ref 12.0–15.0)

## 2016-10-01 LAB — IRON AND TIBC
IRON: 118 ug/dL (ref 28–170)
SATURATION RATIOS: 54 % — AB (ref 10.4–31.8)
TIBC: 218 ug/dL — AB (ref 250–450)
UIBC: 100 ug/dL

## 2016-10-01 LAB — FERRITIN: FERRITIN: 868 ng/mL — AB (ref 11–307)

## 2016-10-01 MED ORDER — EPOETIN ALFA 10000 UNIT/ML IJ SOLN
INTRAMUSCULAR | Status: AC
  Administered 2016-10-01: 10000 [IU]
  Filled 2016-10-01: qty 1

## 2016-10-01 MED ORDER — EPOETIN ALFA 20000 UNIT/ML IJ SOLN
INTRAMUSCULAR | Status: AC
  Administered 2016-10-01: 20000 [IU]
  Filled 2016-10-01: qty 1

## 2016-10-01 MED ORDER — EPOETIN ALFA 40000 UNIT/ML IJ SOLN: 30000.0000 [IU] | INTRAMUSCULAR | Status: DC

## 2016-10-10 ENCOUNTER — Emergency Department (HOSPITAL_COMMUNITY): Payer: Managed Care, Other (non HMO)

## 2016-10-10 ENCOUNTER — Inpatient Hospital Stay (HOSPITAL_COMMUNITY)
Admission: EM | Admit: 2016-10-10 | Discharge: 2016-10-13 | DRG: 193 | Disposition: A | Discharge status: Home / Self Care | Payer: Managed Care, Other (non HMO) | Attending: Internal Medicine | Admitting: Internal Medicine

## 2016-10-10 ENCOUNTER — Encounter (HOSPITAL_COMMUNITY): Payer: Self-pay | Admitting: Emergency Medicine

## 2016-10-10 DIAGNOSIS — Z888 Allergy status to other drugs, medicaments and biological substances status: Secondary | ICD-10-CM

## 2016-10-10 DIAGNOSIS — Z823 Family history of stroke: Secondary | ICD-10-CM | POA: Diagnosis not present

## 2016-10-10 DIAGNOSIS — E1122 Type 2 diabetes mellitus with diabetic chronic kidney disease: Secondary | ICD-10-CM | POA: Diagnosis present

## 2016-10-10 DIAGNOSIS — Z9889 Other specified postprocedural states: Secondary | ICD-10-CM | POA: Diagnosis not present

## 2016-10-10 DIAGNOSIS — Z9981 Dependence on supplemental oxygen: Secondary | ICD-10-CM | POA: Diagnosis not present

## 2016-10-10 DIAGNOSIS — E1165 Type 2 diabetes mellitus with hyperglycemia: Secondary | ICD-10-CM | POA: Diagnosis present

## 2016-10-10 DIAGNOSIS — E1129 Type 2 diabetes mellitus with other diabetic kidney complication: Secondary | ICD-10-CM | POA: Diagnosis present

## 2016-10-10 DIAGNOSIS — J9601 Acute respiratory failure with hypoxia: Secondary | ICD-10-CM | POA: Diagnosis present

## 2016-10-10 DIAGNOSIS — M797 Fibromyalgia: Secondary | ICD-10-CM | POA: Diagnosis present

## 2016-10-10 DIAGNOSIS — I129 Hypertensive chronic kidney disease with stage 1 through stage 4 chronic kidney disease, or unspecified chronic kidney disease: Secondary | ICD-10-CM | POA: Diagnosis present

## 2016-10-10 DIAGNOSIS — R739 Hyperglycemia, unspecified: Secondary | ICD-10-CM

## 2016-10-10 DIAGNOSIS — N189 Chronic kidney disease, unspecified: Secondary | ICD-10-CM | POA: Diagnosis present

## 2016-10-10 DIAGNOSIS — N185 Chronic kidney disease, stage 5: Secondary | ICD-10-CM | POA: Diagnosis present

## 2016-10-10 DIAGNOSIS — D631 Anemia in chronic kidney disease: Secondary | ICD-10-CM | POA: Diagnosis present

## 2016-10-10 DIAGNOSIS — Z8249 Family history of ischemic heart disease and other diseases of the circulatory system: Secondary | ICD-10-CM | POA: Diagnosis not present

## 2016-10-10 DIAGNOSIS — Z79899 Other long term (current) drug therapy: Secondary | ICD-10-CM | POA: Diagnosis not present

## 2016-10-10 DIAGNOSIS — E119 Type 2 diabetes mellitus without complications: Secondary | ICD-10-CM

## 2016-10-10 DIAGNOSIS — Z6836 Body mass index (BMI) 36.0-36.9, adult: Secondary | ICD-10-CM | POA: Diagnosis not present

## 2016-10-10 DIAGNOSIS — I152 Hypertension secondary to endocrine disorders: Secondary | ICD-10-CM | POA: Diagnosis present

## 2016-10-10 DIAGNOSIS — Z8371 Family history of colonic polyps: Secondary | ICD-10-CM | POA: Diagnosis not present

## 2016-10-10 DIAGNOSIS — I2510 Atherosclerotic heart disease of native coronary artery without angina pectoris: Secondary | ICD-10-CM | POA: Diagnosis present

## 2016-10-10 DIAGNOSIS — J189 Pneumonia, unspecified organism: Secondary | ICD-10-CM | POA: Diagnosis not present

## 2016-10-10 DIAGNOSIS — N184 Chronic kidney disease, stage 4 (severe): Secondary | ICD-10-CM | POA: Diagnosis present

## 2016-10-10 DIAGNOSIS — Z794 Long term (current) use of insulin: Secondary | ICD-10-CM

## 2016-10-10 DIAGNOSIS — N186 End stage renal disease: Secondary | ICD-10-CM | POA: Diagnosis present

## 2016-10-10 DIAGNOSIS — Z833 Family history of diabetes mellitus: Secondary | ICD-10-CM | POA: Diagnosis not present

## 2016-10-10 DIAGNOSIS — E669 Obesity, unspecified: Secondary | ICD-10-CM | POA: Diagnosis present

## 2016-10-10 DIAGNOSIS — J181 Lobar pneumonia, unspecified organism: Principal | ICD-10-CM

## 2016-10-10 DIAGNOSIS — I10 Essential (primary) hypertension: Secondary | ICD-10-CM

## 2016-10-10 DIAGNOSIS — IMO0002 Reserved for concepts with insufficient information to code with codable children: Secondary | ICD-10-CM | POA: Diagnosis present

## 2016-10-10 DIAGNOSIS — Z841 Family history of disorders of kidney and ureter: Secondary | ICD-10-CM | POA: Diagnosis not present

## 2016-10-10 DIAGNOSIS — E1159 Type 2 diabetes mellitus with other circulatory complications: Secondary | ICD-10-CM | POA: Diagnosis present

## 2016-10-10 LAB — CBC WITH DIFFERENTIAL/PLATELET
Basophils Absolute: 0 10*3/uL (ref 0.0–0.1)
Basophils Relative: 0 %
Eosinophils Absolute: 0.1 10*3/uL (ref 0.0–0.7)
Eosinophils Relative: 1 %
HEMATOCRIT: 32.3 % — AB (ref 36.0–46.0)
HEMOGLOBIN: 10.7 g/dL — AB (ref 12.0–15.0)
LYMPHS ABS: 1.9 10*3/uL (ref 0.7–4.0)
LYMPHS PCT: 12 %
MCH: 29.7 pg (ref 26.0–34.0)
MCHC: 33.1 g/dL (ref 30.0–36.0)
MCV: 89.7 fL (ref 78.0–100.0)
Monocytes Absolute: 0.9 10*3/uL (ref 0.1–1.0)
Monocytes Relative: 6 %
NEUTROS PCT: 81 %
Neutro Abs: 13.5 10*3/uL — ABNORMAL HIGH (ref 1.7–7.7)
Platelets: 281 10*3/uL (ref 150–400)
RBC: 3.6 MIL/uL — AB (ref 3.87–5.11)
RDW: 14.6 % (ref 11.5–15.5)
WBC: 16.4 10*3/uL — AB (ref 4.0–10.5)

## 2016-10-10 LAB — COMPREHENSIVE METABOLIC PANEL
ALT: 27 U/L (ref 14–54)
AST: 23 U/L (ref 15–41)
Albumin: 3.5 g/dL (ref 3.5–5.0)
Alkaline Phosphatase: 93 U/L (ref 38–126)
Anion gap: 8 (ref 5–15)
BUN: 38 mg/dL — AB (ref 6–20)
CHLORIDE: 104 mmol/L (ref 101–111)
CO2: 23 mmol/L (ref 22–32)
Calcium: 8.7 mg/dL — ABNORMAL LOW (ref 8.9–10.3)
Creatinine, Ser: 3.12 mg/dL — ABNORMAL HIGH (ref 0.44–1.00)
GFR, EST AFRICAN AMERICAN: 19 mL/min — AB (ref >60)
GFR, EST NON AFRICAN AMERICAN: 17 mL/min — AB (ref >60)
Glucose, Bld: 224 mg/dL — ABNORMAL HIGH (ref 65–99)
POTASSIUM: 4.3 mmol/L (ref 3.5–5.1)
SODIUM: 135 mmol/L (ref 135–145)
Total Bilirubin: 0.8 mg/dL (ref 0.3–1.2)
Total Protein: 7.9 g/dL (ref 6.5–8.1)

## 2016-10-10 LAB — I-STAT CG4 LACTIC ACID, ED: Lactic Acid, Venous: 1.42 mmol/L (ref 0.5–1.9)

## 2016-10-10 LAB — GLUCOSE, CAPILLARY: Glucose-Capillary: 210 mg/dL — ABNORMAL HIGH (ref 65–99)

## 2016-10-10 MED ORDER — INSULIN GLARGINE 100 UNIT/ML ~~LOC~~ SOLN
26.0000 [IU] | Freq: Every day | SUBCUTANEOUS | Status: DC
  Filled 2016-10-10: qty 0.26

## 2016-10-10 MED ORDER — SODIUM CHLORIDE 0.9 % IV BOLUS (SEPSIS)
500.0000 mL | Freq: Once | INTRAVENOUS | Status: AC
  Administered 2016-10-10: 500 mL via INTRAVENOUS

## 2016-10-10 MED ORDER — SODIUM CHLORIDE 0.9 % IV BOLUS (SEPSIS)
1000.0000 mL | Freq: Once | INTRAVENOUS | Status: AC
  Administered 2016-10-10: 1000 mL via INTRAVENOUS

## 2016-10-10 MED ORDER — LISINOPRIL 40 MG PO TABS
40.0000 mg | ORAL_TABLET | Freq: Every day | ORAL | Status: DC
  Administered 2016-10-11: 40 mg via ORAL
  Filled 2016-10-10 (×2): qty 1
  Filled 2016-10-10: qty 2

## 2016-10-10 MED ORDER — DEXTROSE 5 % IV SOLN
2.0000 g | INTRAVENOUS | Status: DC
  Administered 2016-10-11: 2 g via INTRAVENOUS
  Filled 2016-10-10: qty 2

## 2016-10-10 MED ORDER — CALCITRIOL 0.5 MCG PO CAPS
0.5000 ug | ORAL_CAPSULE | Freq: Every day | ORAL | Status: DC
  Administered 2016-10-10 – 2016-10-12 (×3): 0.5 ug via ORAL
  Filled 2016-10-10 (×3): qty 1

## 2016-10-10 MED ORDER — IPRATROPIUM-ALBUTEROL 0.5-2.5 (3) MG/3ML IN SOLN: 3.0000 mL | RESPIRATORY_TRACT | Status: DC | PRN

## 2016-10-10 MED ORDER — SODIUM CHLORIDE 0.9 % IV SOLN: 250.0000 mL | INTRAVENOUS | Status: DC | PRN

## 2016-10-10 MED ORDER — DIPHENHYDRAMINE HCL 25 MG PO CAPS
25.0000 mg | ORAL_CAPSULE | ORAL | Status: DC | PRN
  Administered 2016-10-10: 25 mg via ORAL
  Filled 2016-10-10: qty 1

## 2016-10-10 MED ORDER — INSULIN ASPART 100 UNIT/ML ~~LOC~~ SOLN
0.0000 [IU] | Freq: Three times a day (TID) | SUBCUTANEOUS | Status: DC
  Administered 2016-10-11 (×2): 2 [IU] via SUBCUTANEOUS
  Administered 2016-10-11: 3 [IU] via SUBCUTANEOUS
  Administered 2016-10-12: 2 [IU] via SUBCUTANEOUS
  Administered 2016-10-12 (×2): 1 [IU] via SUBCUTANEOUS
  Administered 2016-10-13: 3 [IU] via SUBCUTANEOUS
  Administered 2016-10-13: 2 [IU] via SUBCUTANEOUS

## 2016-10-10 MED ORDER — NEPRO/CARBSTEADY PO LIQD
237.0000 mL | Freq: Two times a day (BID) | ORAL | Status: DC
  Filled 2016-10-10 (×6): qty 237

## 2016-10-10 MED ORDER — DEXTROSE 5 % IV SOLN: 1.0000 g | Freq: Once | INTRAVENOUS | Status: DC

## 2016-10-10 MED ORDER — IPRATROPIUM-ALBUTEROL 0.5-2.5 (3) MG/3ML IN SOLN
3.0000 mL | Freq: Once | RESPIRATORY_TRACT | Status: AC
  Administered 2016-10-10: 3 mL via RESPIRATORY_TRACT
  Filled 2016-10-10: qty 3

## 2016-10-10 MED ORDER — SODIUM CHLORIDE 0.9% FLUSH
3.0000 mL | Freq: Two times a day (BID) | INTRAVENOUS | Status: DC
  Administered 2016-10-10 – 2016-10-13 (×6): 3 mL via INTRAVENOUS

## 2016-10-10 MED ORDER — GABAPENTIN 300 MG PO CAPS
300.0000 mg | ORAL_CAPSULE | Freq: Every day | ORAL | Status: DC
  Administered 2016-10-11 – 2016-10-13 (×3): 300 mg via ORAL
  Filled 2016-10-10 (×3): qty 1

## 2016-10-10 MED ORDER — ORAL CARE MOUTH RINSE
15.0000 mL | Freq: Two times a day (BID) | OROMUCOSAL | Status: DC
  Administered 2016-10-12: 15 mL via OROMUCOSAL

## 2016-10-10 MED ORDER — HEPARIN SODIUM (PORCINE) 5000 UNIT/ML IJ SOLN
5000.0000 [IU] | Freq: Three times a day (TID) | INTRAMUSCULAR | Status: DC
  Administered 2016-10-10 – 2016-10-13 (×8): 5000 [IU] via SUBCUTANEOUS
  Filled 2016-10-10 (×8): qty 1

## 2016-10-10 MED ORDER — ACETAMINOPHEN 325 MG PO TABS
650.0000 mg | ORAL_TABLET | Freq: Once | ORAL | Status: AC
  Administered 2016-10-10: 650 mg via ORAL
  Filled 2016-10-10: qty 2

## 2016-10-10 MED ORDER — INSULIN ASPART 100 UNIT/ML ~~LOC~~ SOLN
0.0000 [IU] | Freq: Every day | SUBCUTANEOUS | Status: DC
  Administered 2016-10-10: 2 [IU] via SUBCUTANEOUS

## 2016-10-10 MED ORDER — DEXTROSE 5 % IV SOLN
500.0000 mg | INTRAVENOUS | Status: DC
  Administered 2016-10-10 – 2016-10-11 (×2): 500 mg via INTRAVENOUS
  Filled 2016-10-10 (×2): qty 500

## 2016-10-10 MED ORDER — INSULIN ASPART 100 UNIT/ML ~~LOC~~ SOLN
8.0000 [IU] | Freq: Three times a day (TID) | SUBCUTANEOUS | Status: DC
  Administered 2016-10-11: 8 [IU] via SUBCUTANEOUS

## 2016-10-10 MED ORDER — SODIUM CHLORIDE 0.9% FLUSH: 3.0000 mL | INTRAVENOUS | Status: DC | PRN

## 2016-10-10 MED ORDER — DEXTROSE 5 % IV SOLN
2.0000 g | Freq: Once | INTRAVENOUS | Status: AC
  Administered 2016-10-10: 2 g via INTRAVENOUS
  Filled 2016-10-10: qty 2

## 2016-10-10 MED ORDER — HYDROCODONE-ACETAMINOPHEN 5-325 MG PO TABS
1.0000 | ORAL_TABLET | ORAL | Status: DC | PRN
  Administered 2016-10-10 – 2016-10-11 (×2): 2 via ORAL
  Filled 2016-10-10 (×2): qty 2

## 2016-10-10 NOTE — ED Notes (Signed)
Pt ambulated to BR w/o assistance. Pt c/o swelling to bilateral ankles that was new. Dr Eulis Foster made aware and assessed pt.

## 2016-10-10 NOTE — ED Provider Notes (Signed)
[facility] DEPT Provider Note   CSN: [id number] Arrival date & time: 10/10/16  1144     History   Chief Complaint Chief Complaint  Patient presents with  . Shortness of Breath  . Cough    HPI GENESSA BEMAN is a 45 y.o. female. She presents for evaluation of cough productive of yellow sputum and shortness of breath, for 2 days. She felt warm but did not take her temperature. She denies nausea or vomiting. She states her glucoses been normal. [name] taking her usual medications. No other recent problems. [name] being readied for dialysis due to chronic renal insufficiency. She denies weakness or dizziness. There are no other known modifying factors.   HPI  Past Medical History:  Diagnosis Date  . Allergy   . Anemia    chronic disease  . Blood transfusion without reported diagnosis   . Chronic constipation   . Chronic kidney disease   . Diabetes mellitus    Type II  . Fibroids    leiomyoma 8/04 per [name]  . Fibromyalgia   . Gastroparesis    DM uncontrolled.   . Headache   . Hyperlipemia   . Hypertension   . IBS (irritable bowel syndrome)   . Infertility    eval at [facility]  . Obesity   . Pancreatitis     Patient Active Problem List   Diagnosis Date Noted  . Anemia of chronic disease 07/22/2016  . Maxillary sinusitis, acute 03/18/2016  . Right-sided low back pain without sciatica 12/08/2015  . Diabetic retinopathy ([place]) 09/29/2015  . Constipation 09/03/2015  . Preventative health care 09/23/2014  . At high risk for falls 09/11/2014  . 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 B12 deficiency 05/10/2014  . Anemia in chronic kidney disease 04/26/2014  . Chronic kidney disease, stage IV (severe) ([place]) 04/26/2014  . Heart murmur, systolic 56/31/4970  . Secondary hyperthyroidism 11/27/2013  . Diabetic neuropathy, painful ([place]) 07/13/2013  . Leiomyoma of uterus, unspecified 02/19/2011  . Type 2 diabetes with complication ([place]) 26/37/8588  . Essential  hypertension 02/19/2011    Past Surgical History:  Procedure Laterality Date  . BASCILIC VEIN TRANSPOSITION Left 08/31/2016   Procedure: BASILIC VEIN TRANSPOSITION FIRST STAGE;  Surgeon: Conrad Hartley, MD;  Location: [place];  Service: Vascular;  Laterality: Left;  . EYE SURGERY     bilateral, vitrectomies  . fibroid tumors removed    . FOOT SURGERY     bilateral    OB History    Gravida Para Term Preterm AB Living   2 1     1 1    SAB TAB Ectopic Multiple Live Births   1               Home Medications    Prior to Admission medications   Medication Sig Start Date End Date Taking? Authorizing Provider  calcitRIOL (ROCALTROL) 0.25 MCG capsule Take 0.5 mcg by mouth at bedtime.    Yes Historical Provider, MD  epoetin alfa (EPOGEN,PROCRIT) 50277 UNIT/ML injection Inject 30,000 Units into the vein every 14 (fourteen) days.    Yes Historical Provider, MD  furosemide (LASIX) 80 MG tablet Take 1 tablet (80 mg total) by mouth 2 (two) times daily. Patient taking differently: Take 160 mg by mouth daily as needed for fluid.  04/12/15  Yes Irene Pap, NP  gabapentin (NEURONTIN) 300 MG capsule Take 1 capsule (300 mg total) by mouth 2 (two) times daily as needed. Patient taking differently:  Take 900 mg by mouth daily.  10/07/15  Yes Renee A Kuneff, DO  insulin aspart (NOVOLOG) 100 UNIT/ML FlexPen Inject 10-15 Units into the skin See admin instructions. Inject 10 units subque in the morning and inject 15 units subqu at lunch and dinner 08/12/16  Yes Historical Provider, MD  insulin degludec (TRESIBA FLEXTOUCH) 100 UNIT/ML SOPN FlexTouch Pen Inject 26 Units into the skin daily with lunch.   Yes Historical Provider, MD  lisinopril (PRINIVIL,ZESTRIL) 40 MG tablet Take 1 tablet (40 mg total) by mouth daily. 01/01/16  Yes Renee A Kuneff, DO  cephALEXin (KEFLEX) 500 MG capsule Take 1 capsule (500 mg total) by mouth 3 (three) times daily. Patient not taking: Reported on 10/10/2016 09/15/16   Ulyses Amor,  PA-C  glucose blood (ONETOUCH VERIO) test strip Test 4 times daily. Patient taking differently: 1 each by Other route 3 (three) times daily.  07/17/15   Irene Pap, NP  oxyCODONE-acetaminophen (PERCOCET/ROXICET) 5-325 MG tablet Take 1 tablet by mouth every 6 (six) hours as needed. Patient not taking: Reported on 10/10/2016 08/31/16   Ulyses Amor, PA-C    Family History Family History  Problem Relation Age of Onset  . Colon polyps Mother   . Diabetes Mother   . Heart murmur Father   . Hypertension Father   . Diabetes Sister   . Kidney failure Sister   . Diabetes Brother   . Retinal degeneration Brother   . Heart murmur Son   . Stroke Paternal Grandmother   . Diabetes Sister     Social History Social History  Substance Use Topics  . Smoking status: Never Smoker  . Smokeless tobacco: Never Used  . Alcohol use No     Allergies   Ibuprofen and Dilaudid [hydromorphone hcl]   Review of Systems Review of Systems  All other systems reviewed and are negative.    Physical Exam Updated Vital Signs BP 137/75   Pulse 103   Temp 99 F (37.2 C) (Oral)   Resp 18   Ht 5\' 8"  (1.727 m)   Wt 242 lb (109.8 kg)   LMP 09/12/2016   SpO2 (!) 89%   BMI 36.80 kg/m   Physical Exam  Constitutional: [name] oriented to person, place, and time. She appears well-developed. No distress.  Overweight  HENT:  Head: Normocephalic and atraumatic.  Eyes: Conjunctivae and EOM are normal. Pupils are equal, round, and reactive to light.  Neck: Normal range of motion and phonation normal. Neck supple.  Cardiovascular: Normal rate and regular rhythm.   Pulmonary/Chest: Effort normal and breath sounds normal. No respiratory distress. She has no wheezes. She exhibits no tenderness.  Abdominal: Soft. She exhibits no distension. There is no tenderness. There is no guarding.  Musculoskeletal: Normal range of motion.  Neurological: [name] alert and oriented to person, place, and time. She exhibits  normal muscle tone.  Skin: Skin is warm and dry.  Psychiatric: She has a normal mood and affect. Her behavior is normal. Judgment and thought content normal.  Nursing note and vitals reviewed.    ED Treatments / Results  Labs (all labs ordered are listed, but only abnormal results are displayed) Labs Reviewed  COMPREHENSIVE METABOLIC PANEL - Abnormal; Notable for the following:       Result Value   Glucose, Bld 224 (*)    BUN 38 (*)    Creatinine, Ser 3.12 (*)    Calcium 8.7 (*)    GFR calc non Af Wyvonnia Lora  17 (*)    GFR calc Af Amer 19 (*)    All other components within normal limits  CBC WITH DIFFERENTIAL/PLATELET - Abnormal; Notable for the following:    WBC 16.4 (*)    RBC 3.60 (*)    Hemoglobin 10.7 (*)    HCT 32.3 (*)    Neutro Abs 13.5 (*)    All other components within normal limits  CULTURE, BLOOD (ROUTINE X 2)  CULTURE, BLOOD (ROUTINE X 2)  I-STAT CG4 LACTIC ACID, ED  I-STAT CG4 LACTIC ACID, ED    EKG  EKG Interpretation None       Radiology Dg Chest 2 View  Result Date: 10/10/2016 CLINICAL DATA:  Shortness of breath, cough chest pain and fever for 2 days. EXAM: CHEST  2 VIEW COMPARISON:  01/09/2015 and prior radiographs FINDINGS: Right upper lobe, right lower lobe, and left upper lobe airspace disease identified and likely representing pneumonia. Upper limits normal heart size again noted. No pleural effusion, pneumothorax or acute bony abnormality identified. A moderate to severe apex right thoracolumbar scoliosis again noted. IMPRESSION: Right upper lobe, right lower lobe and left upper lobe airspace disease likely representing pneumonia. Asymmetric edema is considered less likely. Radiographic follow-up to resolution recommended. Electronically Signed   By: Margarette Canada M.D.   On: 10/10/2016 12:25    Procedures Procedures (including critical care time)  Medications Ordered in ED Medications  sodium chloride 0.9 % bolus 1,000 mL (0 mLs Intravenous Stopped  10/10/16 1724)    And  sodium chloride 0.9 % bolus 1,000 mL (0 mLs Intravenous Stopped 10/10/16 1724)    And  sodium chloride 0.9 % bolus 1,000 mL (1,000 mLs Intravenous New Bag/Given 10/10/16 1726)    And  sodium chloride 0.9 % bolus 500 mL (500 mLs Intravenous New Bag/Given 10/10/16 1726)  cefTRIAXone (ROCEPHIN) 2 g in dextrose 5 % 50 mL IVPB (not administered)  ipratropium-albuterol (DUONEB) 0.5-2.5 (3) MG/3ML nebulizer solution 3 mL (not administered)  acetaminophen (TYLENOL) tablet 650 mg (not administered)  cefTRIAXone (ROCEPHIN) 2 g in dextrose 5 % 50 mL IVPB (0 g Intravenous Stopped 10/10/16 1711)     Initial Impression / Assessment and Plan / ED Course  I have reviewed the triage vital signs and the nursing notes.  Pertinent labs & imaging results that were available during my care of the patient were reviewed by me and considered in my medical decision making (see chart for details).  Clinical Course  Comment By Time  Initial evaluation consistent with community-acquired pneumonia. Patient at moderately high risk with diabetes, and chronic renal insufficiency. Sepsis screening initiated, empiric treatment begun. Daleen Bo, MD 10/15 1521    Medications  sodium chloride 0.9 % bolus 1,000 mL (0 mLs Intravenous Stopped 10/10/16 1724)    And  sodium chloride 0.9 % bolus 1,000 mL (0 mLs Intravenous Stopped 10/10/16 1724)    And  sodium chloride 0.9 % bolus 1,000 mL (1,000 mLs Intravenous New Bag/Given 10/10/16 1726)    And  sodium chloride 0.9 % bolus 500 mL (500 mLs Intravenous New Bag/Given 10/10/16 1726)  cefTRIAXone (ROCEPHIN) 2 g in dextrose 5 % 50 mL IVPB (not administered)  ipratropium-albuterol (DUONEB) 0.5-2.5 (3) MG/3ML nebulizer solution 3 mL (not administered)  acetaminophen (TYLENOL) tablet 650 mg (not administered)  cefTRIAXone (ROCEPHIN) 2 g in dextrose 5 % 50 mL IVPB (0 g Intravenous Stopped 10/10/16 1711)    Patient Vitals for the past 24 hrs:  BP Temp  Temp src Pulse Resp  SpO2 Height Weight  10/10/16 1715 - - - 103 - (!) 89 % - -  10/10/16 1700 137/75 - - 101 - 93 % - -  10/10/16 1647 132/74 - - 103 18 95 % - -  10/10/16 1645 132/74 - - 102 - 91 % - -  10/10/16 1500 176/94 - - 101 17 91 % - -  10/10/16 1457 164/95 99 F (37.2 C) Oral 100 18 (!) 87 % - -  10/10/16 1157 152/85 99 F (37.2 C) Oral 101 18 92 % 5\' 8"  (1.727 m) 242 lb (109.8 kg)    5:40 PM Reevaluation with update and discussion. After initial assessment and treatment, an updated evaluation reveals Comfortable now on nasal cannula oxygen 2 L with low O2 saturation. Nebulizer ordered . Doubt impending respiratory collapse. Patient updated on findings and plan. She agrees to admission. Dorethia Jeanmarie L   5:48 PM-Consult complete with hospitalist. Patient case explained and discussed. he agrees to admit patient for further evaluation and treatment. Call ended at 18:15  CRITICAL CARE Performed by: Richarda Blade Total critical care time: 40 minutes Critical care time was exclusive of separately billable procedures and treating other patients. Critical care was necessary to treat or prevent imminent or life-threatening deterioration. Critical care was time spent personally by me on the following activities: development of treatment plan with patient and/or surrogate as well as nursing, discussions with consultants, evaluation of patient's response to treatment, examination of patient, obtaining history from patient or surrogate, ordering and performing treatments and interventions, ordering and review of laboratory studies, ordering and review of radiographic studies, pulse oximetry and re-evaluation of patient's condition.  Final Clinical Impressions(s) / ED Diagnoses   Final diagnoses:  Community acquired pneumonia, unspecified laterality  Acute hypoxemic respiratory failure ([place])  Hyperglycemia    Multifocal pneumonia with hypoxia, with comorbidities of renal insufficiency,  and diabetes.she will require admission for close monitoring and treatment.  Nursing Notes Reviewed/ Care Coordinated Applicable Imaging Reviewed Interpretation of Laboratory Data incorporated into ED treatment  New Prescriptions New Prescriptions   No medications on file     Daleen Bo, MD 10/10/16 1831

## 2016-10-10 NOTE — ED Notes (Signed)
No respiratory or acute distress noted alert and oriented x 3 call light in reach no reaction to medication noted able to speak in full sentences.

## 2016-10-10 NOTE — H&P (Signed)
History and Physical    Heather [name] EPP:[id number] DOB: [birth year] DOA: 10/10/2016  PCP: Heather Pouch, DO   Patient coming from: Home   Chief Complaint: Dyspnea, productive cough, headache  HPI: Heather [name] is a 45 y.o. female with medical history significant for hypertension, insulin-dependent diabetes mellitus, chronic kidney disease stage IV undergoing left upper extremity AV fistula creation, now presenting to the emergency department with 2 days of progressive dyspnea, chills, productive cough, and headache. Patient reports that she had been in her usual state of health until approximately 2 days ago when she developed a general sense of malaise with a mild headache and sinus congestion. By that evening, patient was having difficulty catching her breath with minimal exertion and had developed a cough productive of thick yellow sputum. Patient denies any facial pain or earache, and denies sore throat. She denies chest pain or palpitations, but notes some new bilateral ankle edema. There is no orthopnea. Patient denies any recent long distance travel or sick contacts. She initially felt that she was developing a sinus infection, but those symptoms have begun to ease off as her dyspnea, chills, malaise, and productive cough of all worsened. Just prior to arrival, the patient had become dyspneic while at rest and so she came into the ED for evaluation of this.  ED Course: Upon arrival to the ED, patient is found to be afebrile, saturating in the mid 80s on room air, tachycardic in the low 100s, and with vitals otherwise stable. EKG demonstrates a sinus rhythm with low voltage QRS and chest x-rays notable for airspace disease in the right upper lobe, right lower lobe, and left upper lobe consistent with pneumonia. Chemistry panel features a BUN of 38 and serum creatinine of 3.12 which appears to be consistent with her baseline. CBC features a stable normocytic anemia with hemoglobin 10.7 and a  new leukocytosis to 16,400. Lactic acid is reassuring at 1.42. Blood cultures were obtained and the patient was treated with 3.5 L of normal saline, DuoNeb, supplemental oxygen, Rocephin, and Tylenol. Given her considerable comorbidity and new supplemental oxygen requirement, patient will be admitted to the telemetry unit for ongoing evaluation and management of apparent community-acquired pneumonia with acute hypoxic respiratory failure.  Review of Systems:  All other systems reviewed and apart from HPI, are negative.  Past Medical History:  Diagnosis Date  . Allergy   . Anemia    chronic disease  . Blood transfusion without reported diagnosis   . Chronic constipation   . Chronic kidney disease   . Diabetes mellitus    Type II  . Fibroids    leiomyoma 8/04 per [name]  . Fibromyalgia   . Gastroparesis    DM uncontrolled.   . Headache   . Hyperlipemia   . Hypertension   . IBS (irritable bowel syndrome)   . Infertility    eval at [facility]  . Obesity   . Pancreatitis     Past Surgical History:  Procedure Laterality Date  . BASCILIC VEIN TRANSPOSITION Left 08/31/2016   Procedure: BASILIC VEIN TRANSPOSITION FIRST STAGE;  Surgeon: Conrad , MD;  Location: [place];  Service: Vascular;  Laterality: Left;  . EYE SURGERY     bilateral, vitrectomies  . fibroid tumors removed    . FOOT SURGERY     bilateral     reports that she has never smoked. She has never used smokeless tobacco. She reports that she does not drink alcohol or use drugs.  Allergies  Allergen Reactions  . Ibuprofen Other (See Comments)    CKD stage 3. Should avoid.  . Dilaudid [Hydromorphone Hcl] Itching and Other (See Comments)    Can take with benadryl    Family History  Problem Relation Age of Onset  . Colon polyps Mother   . Diabetes Mother   . Heart murmur Father   . Hypertension Father   . Diabetes Sister   . Kidney failure Sister   . Diabetes Brother   . Retinal degeneration Brother   . Heart murmur  Son   . Stroke Paternal Grandmother   . Diabetes Sister      Prior to Admission medications   Medication Sig Start Date End Date Taking? Authorizing Provider  calcitRIOL (ROCALTROL) 0.25 MCG capsule Take 0.5 mcg by mouth at bedtime.    Yes Historical Provider, MD  epoetin alfa (EPOGEN,PROCRIT) 54008 UNIT/ML injection Inject 30,000 Units into the vein every 14 (fourteen) days.    Yes Historical Provider, MD  furosemide (LASIX) 80 MG tablet Take 1 tablet (80 mg total) by mouth 2 (two) times daily. Patient taking differently: Take 160 mg by mouth daily as needed for fluid.  04/12/15  Yes Irene Pap, NP  gabapentin (NEURONTIN) 300 MG capsule Take 1 capsule (300 mg total) by mouth 2 (two) times daily as needed. Patient taking differently: Take 900 mg by mouth daily.  10/07/15  Yes Renee A Kuneff, DO  insulin aspart (NOVOLOG) 100 UNIT/ML FlexPen Inject 10-15 Units into the skin See admin instructions. Inject 10 units subque in the morning and inject 15 units subqu at lunch and dinner 08/12/16  Yes Historical Provider, MD  insulin degludec (TRESIBA FLEXTOUCH) 100 UNIT/ML SOPN FlexTouch Pen Inject 26 Units into the skin daily with lunch.   Yes Historical Provider, MD  lisinopril (PRINIVIL,ZESTRIL) 40 MG tablet Take 1 tablet (40 mg total) by mouth daily. 01/01/16  Yes Renee A Kuneff, DO  cephALEXin (KEFLEX) 500 MG capsule Take 1 capsule (500 mg total) by mouth 3 (three) times daily. Patient not taking: Reported on 10/10/2016 09/15/16   Ulyses Amor, PA-C  glucose blood (ONETOUCH VERIO) test strip Test 4 times daily. Patient taking differently: 1 each by Other route 3 (three) times daily.  07/17/15   Irene Pap, NP  oxyCODONE-acetaminophen (PERCOCET/ROXICET) 5-325 MG tablet Take 1 tablet by mouth every 6 (six) hours as needed. Patient not taking: Reported on 10/10/2016 08/31/16   Ulyses Amor, PA-C    Physical Exam: Vitals:   10/10/16 1645 10/10/16 1647 10/10/16 1700 10/10/16 1715  BP: 132/74  132/74 137/75   Pulse: 102 103 101 103  Resp:  18    Temp:      TempSrc:      SpO2: 91% 95% 93% (!) 89%  Weight:      Height:          Constitutional: Appears uncomfortable, but not in acute distress; obese Eyes: PERTLA, lids and conjunctivae normal ENMT: Mucous membranes are moist. Posterior pharynx clear of any exudate or lesions.   Neck: normal, supple, no masses, no thyromegaly Respiratory: Rhonchi at both mid-lung zones and right apex. Mild increase in WOB. No pallor.  Cardiovascular: S1 & S2 heard, regular rate and rhythm. 1+ pedal edema b/l. No significant JVD. Abdomen: No distension, no tenderness, no masses palpated. Bowel sounds normal.  Musculoskeletal: no clubbing / cyanosis. Surgical site at LUE c/d/i. Normal muscle tone.  Skin: no significant rashes, lesions, ulcers. Warm, dry, well-perfused. Neurologic: CN 2-12  grossly intact. Sensation intact, DTR normal. Strength 5/5 in all 4 limbs.  Psychiatric: Normal judgment and insight. Alert and oriented x 3. Normal mood and affect.     Labs on Admission: I have personally reviewed following labs and imaging studies  CBC:  Recent Labs Lab 10/10/16 1530  WBC 16.4*  NEUTROABS 13.5*  HGB 10.7*  HCT 32.3*  MCV 89.7  PLT 782   Basic Metabolic Panel:  Recent Labs Lab 10/10/16 1530  NA 135  K 4.3  CL 104  CO2 23  GLUCOSE 224*  BUN 38*  CREATININE 3.12*  CALCIUM 8.7*   GFR: Estimated Creatinine Clearance: 29.3 mL/min (by C-G formula based on SCr of 3.12 mg/dL (H)). Liver Function Tests:  Recent Labs Lab 10/10/16 1530  AST 23  ALT 27  ALKPHOS 93  BILITOT 0.8  PROT 7.9  ALBUMIN 3.5   No results for input(s): LIPASE, AMYLASE in the last 168 hours. No results for input(s): AMMONIA in the last 168 hours. Coagulation Profile: No results for input(s): INR, PROTIME in the last 168 hours. Cardiac Enzymes: No results for input(s): CKTOTAL, CKMB, CKMBINDEX, TROPONINI in the last 168 hours. BNP (last 3  results) No results for input(s): PROBNP in the last 8760 hours. HbA1C: No results for input(s): HGBA1C in the last 72 hours. CBG: No results for input(s): GLUCAP in the last 168 hours. Lipid Profile: No results for input(s): CHOL, HDL, LDLCALC, TRIG, CHOLHDL, LDLDIRECT in the last 72 hours. Thyroid Function Tests: No results for input(s): TSH, T4TOTAL, FREET4, T3FREE, THYROIDAB in the last 72 hours. Anemia Panel: No results for input(s): VITAMINB12, FOLATE, FERRITIN, TIBC, IRON, RETICCTPCT in the last 72 hours. Urine analysis:    Component Value Date/Time   COLORURINE YELLOW 09/07/2015 1230   APPEARANCEUR CLEAR 09/07/2015 1230   LABSPEC 1.019 09/07/2015 1230   PHURINE 6.0 09/07/2015 1230   GLUCOSEU >1000 (A) 09/07/2015 1230   HGBUR TRACE (A) 09/07/2015 1230   BILIRUBINUR NEGATIVE 09/07/2015 1230   BILIRUBINUR negative 04/17/2015 1149   BILIRUBINUR neg 08/08/2013   KETONESUR NEGATIVE 09/07/2015 1230   PROTEINUR 100 (A) 09/07/2015 1230   UROBILINOGEN 0.2 09/07/2015 1230   NITRITE NEGATIVE 09/07/2015 1230   LEUKOCYTESUR NEGATIVE 09/07/2015 1230   Sepsis Labs: @LABRCNTIP (procalcitonin:4,lacticidven:4) )No results found for this or any previous visit (from the past 240 hour(s)).   Radiological Exams on Admission: Dg Chest 2 View  Result Date: 10/10/2016 CLINICAL DATA:  Shortness of breath, cough chest pain and fever for 2 days. EXAM: CHEST  2 VIEW COMPARISON:  01/09/2015 and prior radiographs FINDINGS: Right upper lobe, right lower lobe, and left upper lobe airspace disease identified and likely representing pneumonia. Upper limits normal heart size again noted. No pleural effusion, pneumothorax or acute bony abnormality identified. A moderate to severe apex right thoracolumbar scoliosis again noted. IMPRESSION: Right upper lobe, right lower lobe and left upper lobe airspace disease likely representing pneumonia. Asymmetric edema is considered less likely. Radiographic follow-up to  resolution recommended. Electronically Signed   By: Margarette Canada M.D.   On: 10/10/2016 12:25    EKG: Independently reviewed. Sinus rhythm, low-voltage QRS  Assessment/Plan  1. CAP with acute hypoxic respiratory failure  - Presents with hypoxia, productive cough, rhonchi, leukocytosis, and CXR findings consistent with PNA  - Lactic acid reassuring at 1.42; there is no confusion or AKI    - Blood cultures are incubating, sputum culture and gram stain requested; check urine for strep pneumo antigen  - Given 3.5 L NS  in ED, will SLIV now and monitor fluid status  - Empiric Rocephin initiated in ED, will continue with the addition of azithromycin  - Monitor with continuous pulse oximetry and wean supplemental O2 as tolerated    2. CKD stage IV - Followed by Dr. Jimmy Footman of nephrology and preparing for HD - Undergoing LUE AVF placement with Dr. [name] of vascular, first phase on 08/31/16  - SCr is 3.12 on admission, and this appears to be her baseline  - She was given 3.5 L NS in ED; SLIV now and follow strict I/O's, may need diuresis once the acute infection is under control   3. Insulin dependent DM  - A1c 9.0% in September 2017  - At home, she is managed with Tresiba 26 units at lunch, and Novolog 10 units with breakfast and dinner, and 15 units at lunch; will hold these here  - Check CBG with meals and qHS  - Lantus 26 units qAM, Novolog 8 units TID with meals, and a low-intensity sliding-scale correctional    4. Hypertension  - At goal currently  - Continue lisinopril   5. Anemia of chronic disease - Hgb 10.7 on admission, stable from priors and with no bleeding  - Secondary to CKD and managed with Aranesp     DVT prophylaxis: sq heparin Code Status: Full  Family Communication: Discussed with patient Disposition Plan: Admit to telemetry Consults called: None Admission status: Inpatient    Vianne Bulls, MD [facility] Pager [phone]  If 7PM-7AM, please contact  night-coverage [email or web address] Password [facility]  10/10/2016, 6:33 PM

## 2016-10-10 NOTE — Progress Notes (Signed)
Pharmacy Antibiotic Follow-up Note  Heather [name] is a 45 y.o. year-old female admitted on 10/10/2016.  The patient is currently on day 1 of Rocephin for rule out sepsis.  Assessment/Plan: This patient's current antibiotics will be continued without adjustments.  Rocephin 2gm q24  Temp (24hrs), Avg:99 F (37.2 C), Min:99 F (37.2 C), Max:99 F (37.2 C)  No results for input(s): WBC in the last 168 hours.  Invalid input(s):  CREATININE No results for input(s): CREATININE in the last 168 hours. CrCl cannot be calculated (Patient's most recent lab result is older than the maximum 21 days allowed.).    Allergies  Allergen Reactions  . Ibuprofen Other (See Comments)    CKD stage 3. Should avoid.  . Dilaudid [Hydromorphone Hcl] Itching and Other (See Comments)    Can take with benadryl   Antimicrobials this admission: 10/15 Rocephin >>   Microbiology results: 10/15 BCx: sent  Thank you for allowing pharmacy to be a part of this patient's care.  Minda Ditto PharmD 10/10/2016 3:16 PM

## 2016-10-10 NOTE — ED Triage Notes (Signed)
Pt reports SOB, nasal congestion, headache, and productive cough x2 days. Denies abdominal pain, N/V/D.

## 2016-10-11 ENCOUNTER — Encounter: Payer: Self-pay | Admitting: Vascular Surgery

## 2016-10-11 LAB — BASIC METABOLIC PANEL
Anion gap: 7 (ref 5–15)
BUN: 36 mg/dL — AB (ref 6–20)
CHLORIDE: 109 mmol/L (ref 101–111)
CO2: 21 mmol/L — ABNORMAL LOW (ref 22–32)
Calcium: 7.8 mg/dL — ABNORMAL LOW (ref 8.9–10.3)
Creatinine, Ser: 3.01 mg/dL — ABNORMAL HIGH (ref 0.44–1.00)
GFR calc Af Amer: 20 mL/min — ABNORMAL LOW (ref >60)
GFR, EST NON AFRICAN AMERICAN: 18 mL/min — AB (ref >60)
GLUCOSE: 206 mg/dL — AB (ref 65–99)
POTASSIUM: 4.7 mmol/L (ref 3.5–5.1)
Sodium: 137 mmol/L (ref 135–145)

## 2016-10-11 LAB — GLUCOSE, CAPILLARY
GLUCOSE-CAPILLARY: 157 mg/dL — AB (ref 65–99)
GLUCOSE-CAPILLARY: 232 mg/dL — AB (ref 65–99)
Glucose-Capillary: 162 mg/dL — ABNORMAL HIGH (ref 65–99)
Glucose-Capillary: 193 mg/dL — ABNORMAL HIGH (ref 65–99)

## 2016-10-11 LAB — CBC WITH DIFFERENTIAL/PLATELET
Basophils Absolute: 0 10*3/uL (ref 0.0–0.1)
Basophils Relative: 0 %
EOS PCT: 1 %
Eosinophils Absolute: 0.1 10*3/uL (ref 0.0–0.7)
HCT: 27.8 % — ABNORMAL LOW (ref 36.0–46.0)
Hemoglobin: 8.9 g/dL — ABNORMAL LOW (ref 12.0–15.0)
LYMPHS ABS: 1.9 10*3/uL (ref 0.7–4.0)
LYMPHS PCT: 18 %
MCH: 29.1 pg (ref 26.0–34.0)
MCHC: 32 g/dL (ref 30.0–36.0)
MCV: 90.8 fL (ref 78.0–100.0)
MONO ABS: 0.8 10*3/uL (ref 0.1–1.0)
Monocytes Relative: 7 %
Neutro Abs: 8 10*3/uL — ABNORMAL HIGH (ref 1.7–7.7)
Neutrophils Relative %: 74 %
PLATELETS: 215 10*3/uL (ref 150–400)
RBC: 3.06 MIL/uL — ABNORMAL LOW (ref 3.87–5.11)
RDW: 14.8 % (ref 11.5–15.5)
WBC: 10.8 10*3/uL — ABNORMAL HIGH (ref 4.0–10.5)

## 2016-10-11 LAB — HIV ANTIBODY (ROUTINE TESTING W REFLEX): HIV Screen 4th Generation wRfx: NONREACTIVE

## 2016-10-11 LAB — STREP PNEUMONIAE URINARY ANTIGEN: STREP PNEUMO URINARY ANTIGEN: NEGATIVE

## 2016-10-11 MED ORDER — FUROSEMIDE 40 MG PO TABS
80.0000 mg | ORAL_TABLET | Freq: Two times a day (BID) | ORAL | Status: DC
  Administered 2016-10-11 – 2016-10-13 (×4): 80 mg via ORAL
  Filled 2016-10-11 (×4): qty 2

## 2016-10-11 MED ORDER — HYDRALAZINE HCL 20 MG/ML IJ SOLN
10.0000 mg | INTRAMUSCULAR | Status: DC | PRN
  Administered 2016-10-11: 10 mg via INTRAVENOUS
  Filled 2016-10-11: qty 1

## 2016-10-11 MED ORDER — INSULIN ASPART 100 UNIT/ML ~~LOC~~ SOLN
5.0000 [IU] | Freq: Three times a day (TID) | SUBCUTANEOUS | Status: DC
  Administered 2016-10-11: 5 [IU] via SUBCUTANEOUS

## 2016-10-11 MED ORDER — INSULIN GLARGINE 100 UNIT/ML ~~LOC~~ SOLN
20.0000 [IU] | Freq: Every day | SUBCUTANEOUS | Status: DC
  Administered 2016-10-11 – 2016-10-13 (×3): 20 [IU] via SUBCUTANEOUS
  Filled 2016-10-11 (×3): qty 0.2

## 2016-10-11 MED ORDER — ACETAMINOPHEN 325 MG PO TABS
650.0000 mg | ORAL_TABLET | Freq: Four times a day (QID) | ORAL | Status: DC | PRN
  Administered 2016-10-11 – 2016-10-12 (×2): 650 mg via ORAL
  Filled 2016-10-11 (×2): qty 2

## 2016-10-11 MED ORDER — INSULIN ASPART 100 UNIT/ML ~~LOC~~ SOLN
8.0000 [IU] | Freq: Three times a day (TID) | SUBCUTANEOUS | Status: DC
  Administered 2016-10-11 – 2016-10-13 (×6): 8 [IU] via SUBCUTANEOUS

## 2016-10-11 NOTE — Progress Notes (Signed)
PROGRESS NOTE    Heather [name]  EHU:[id number]  DOB: 09-Sep-1971  DOA: 10/10/2016 PCP: Howard Pouch, DO Outpatient Specialists:   Hospital course: Heather [name] is a 45 y.o. female with medical history significant for hypertension, insulin-dependent diabetes mellitus, chronic kidney disease stage IV undergoing left upper extremity AV fistula creation, now presenting to the emergency department with 2 days of progressive dyspnea, chills, productive cough, and headache.  Assessment & Plan: 1. CAP with acute hypoxic respiratory failure  - Presents with hypoxia, productive cough, rhonchi, leukocytosis, and CXR findings consistent with PNA  - Lactic acid reassuring at 1.42; there is no confusion or AKI    - Blood cultures are incubating, sputum culture and gram stain requested; check urine for strep pneumo antigen  - Given 3.5 L NS in ED, will SLIV now and monitor fluid status  - Empiric Rocephin initiated in ED, will continue with the addition of azithromycin. Plan to transition to oral antibiotic therapy 10/17.  - Monitor with continuous pulse oximetry and wean supplemental O2 as tolerated    2. CKD stage IV - Followed by Dr. Jimmy Footman of nephrology and preparing for HD - Undergoing LUE AVF placement with Dr. [name] of vascular, first phase on 08/31/16  - SCr is 3.12 on admission, and this appears to be her baseline  - She was given 3.5 L NS in ED; SLIV now and follow strict I/O's, may need diuresis once the acute infection is under control   3. Insulin dependent DM  - A1c 9.0% in September 2017  - At home, she is managed with Tresiba 26 units at lunch, and Novolog 10 units with breakfast and dinner, and 15 units at lunch; will hold these here  - Check CBG with meals and qHS  - Lantus 26 units qAM, Novolog 8 units TID with meals, and a low-intensity sliding-scale correctional    4. Hypertension  - At goal currently  - Continue lisinopril   5. Anemia of chronic disease - Hgb  10.7 on admission, stable from priors and with no bleeding  - Secondary to CKD and managed with Aranesp    DVT prophylaxis: sq heparin Code Status: Full  Family Communication: Discussed with patient Disposition Plan: Admit to telemetry Consults called: None Admission status: Inpatient   Subjective: The patient is without complaints today  Objective: Vitals:   10/10/16 1832 10/10/16 1946 10/10/16 2022 10/11/16 0452  BP: 105/60 128/76 (!) 151/77 (!) 159/83  Pulse: 103 110 (!) 107 94  Resp: 18 20 20    Temp:  98.7 F (37.1 C) 98.6 F (37 C) 98.1 F (36.7 C)  TempSrc:  Oral Oral Oral  SpO2: 100% 94% 91% 92%  Weight:   111.1 kg (244 lb 14.9 oz) 110.8 kg (244 lb 4.3 oz)  Height:   5\' 8"  (1.727 m)     Intake/Output Summary (Last 24 hours) at 10/11/16 0820 Last data filed at 10/11/16 0648  Gross per 24 hour  Intake              733 ml  Output                0 ml  Net              733 ml   Filed Weights   10/10/16 1157 10/10/16 2022 10/11/16 0452  Weight: 109.8 kg (242 lb) 111.1 kg (244 lb 14.9 oz) 110.8 kg (244 lb 4.3 oz)    Exam:  General exam: Awake, alert  no apparent distress.[name] Respiratory system: Diminished breath sounds in the right lower lobe. No increased work of breathing. Cardiovascular system: S1 & S2 heard, RRR. No JVD, murmurs, gallops, clicks or pedal edema. Gastrointestinal system: Abdomen is nondistended, soft and nontender. Normal bowel sounds heard. Central nervous system: Alert and oriented. No focal neurological deficits. Extremities: no cyanosis, trace pretibial edema.  Data Reviewed: Basic Metabolic Panel:  Recent Labs Lab 10/10/16 1530 10/11/16 0445  NA 135 137  K 4.3 4.7  CL 104 109  CO2 23 21*  GLUCOSE 224* 206*  BUN 38* 36*  CREATININE 3.12* 3.01*  CALCIUM 8.7* 7.8*   Liver Function Tests:  Recent Labs Lab 10/10/16 1530  AST 23  ALT 27  ALKPHOS 93  BILITOT 0.8  PROT 7.9  ALBUMIN 3.5   No results for input(s): LIPASE,  AMYLASE in the last 168 hours. No results for input(s): AMMONIA in the last 168 hours. CBC:  Recent Labs Lab 10/10/16 1530 10/11/16 0445  WBC 16.4* 10.8*  NEUTROABS 13.5* 8.0*  HGB 10.7* 8.9*  HCT 32.3* 27.8*  MCV 89.7 90.8  PLT 281 215   Cardiac Enzymes: No results for input(s): CKTOTAL, CKMB, CKMBINDEX, TROPONINI in the last 168 hours. CBG (last 3)   Recent Labs  10/10/16 2142 10/11/16 0732  GLUCAP 210* 157*   Recent Results (from the past 240 hour(s))  Blood Culture (routine x 2)     Status: None (Preliminary result)   Collection Time: 10/10/16  3:30 PM  Result Value Ref Range Status   Specimen Description BLOOD RIGHT ARM  Final   Special Requests BOTTLES DRAWN AEROBIC AND ANAEROBIC 5CC EA  Final   Culture PENDING  Incomplete   Report Status PENDING  Incomplete     Studies: Dg Chest 2 View  Result Date: 10/10/2016 CLINICAL DATA:  Shortness of breath, cough chest pain and fever for 2 days. EXAM: CHEST  2 VIEW COMPARISON:  01/09/2015 and prior radiographs FINDINGS: Right upper lobe, right lower lobe, and left upper lobe airspace disease identified and likely representing pneumonia. Upper limits normal heart size again noted. No pleural effusion, pneumothorax or acute bony abnormality identified. A moderate to severe apex right thoracolumbar scoliosis again noted. IMPRESSION: Right upper lobe, right lower lobe and left upper lobe airspace disease likely representing pneumonia. Asymmetric edema is considered less likely. Radiographic follow-up to resolution recommended. Electronically Signed   By: Margarette Canada M.D.   On: 10/10/2016 12:25     Scheduled Meds: . azithromycin  500 mg Intravenous Q24H  . calcitRIOL  0.5 mcg Oral QHS  . cefTRIAXone (ROCEPHIN)  IV  2 g Intravenous Q24H  . feeding supplement (NEPRO CARB STEADY)  237 mL Oral BID BM  . gabapentin  300 mg Oral Daily  . heparin  5,000 Units Subcutaneous Q8H  . insulin aspart  0-5 Units Subcutaneous QHS  . insulin  aspart  0-9 Units Subcutaneous TID WC  . insulin aspart  5 Units Subcutaneous TID WC  . insulin glargine  20 Units Subcutaneous Daily  . lisinopril  40 mg Oral Daily  . mouth rinse  15 mL Mouth Rinse BID  . sodium chloride flush  3 mL Intravenous Q12H   Continuous Infusions:   Principal Problem:   PNA (pneumonia) Active Problems:   Diabetes mellitus, type II, insulin dependent (HCC)   Essential hypertension   Anemia in chronic kidney disease   Chronic kidney disease, stage IV (severe) ([place])   Acute hypoxemic respiratory failure ([place])  Community acquired pneumonia  Time spent:   Irwin Brakeman, MD, FAAFP [facility] Pager [phone]  If 7PM-7AM, please contact night-coverage [email or web address] Password [email or web address] 10/11/2016, 8:20 AM    LOS: 1 day

## 2016-10-11 NOTE — Progress Notes (Signed)
Nutrition Brief Note  Patient identified on the Malnutrition Screening Tool (MST) Report  Wt Readings from Last 15 Encounters:  10/11/16 244 lb 4.3 oz (110.8 kg)  09/15/16 227 lb 8 oz (103.2 kg)  08/31/16 228 lb (103.4 kg)  08/27/16 228 lb (103.4 kg)  08/20/16 242 lb (109.8 kg)  06/25/16 232 lb (105.2 kg)  05/28/16 232 lb (105.2 kg)  05/12/16 229 lb 8 oz (104.1 kg)  03/18/16 227 lb 8 oz (103.2 kg)  02/13/16 232 lb (105.2 kg)  12/19/15 227 lb (103 kg)  12/08/15 227 lb 12 oz (103.3 kg)  10/21/15 238 lb (108 kg)  09/18/15 230 lb (104.3 kg)  09/12/15 230 lb (104.3 kg)    Body mass index is 37.14 kg/m. Patient meets criteria for OBESITY based on current BMI. Pt to begin HD and AV fistula has already been placed; skin otherwise WDL.  Pt admitted with SOB, nasal congestion, headache, and productive cough x2 days. No abdominal pain, N/V/D PTA. Current diet order is Renal with 1200 mL fluid restriction. Labs and medications reviewed.   No nutrition interventions warranted at this time. If nutrition issues arise, please consult RD.     Jarome Matin, MS, RD, LDN Inpatient Clinical Dietitian Pager # [phone] After hours/weekend pager # [phone]

## 2016-10-12 ENCOUNTER — Encounter (HOSPITAL_COMMUNITY): Payer: Self-pay | Admitting: Family Medicine

## 2016-10-12 DIAGNOSIS — E1122 Type 2 diabetes mellitus with diabetic chronic kidney disease: Secondary | ICD-10-CM

## 2016-10-12 DIAGNOSIS — E1129 Type 2 diabetes mellitus with other diabetic kidney complication: Secondary | ICD-10-CM | POA: Diagnosis present

## 2016-10-12 DIAGNOSIS — IMO0002 Reserved for concepts with insufficient information to code with codable children: Secondary | ICD-10-CM

## 2016-10-12 DIAGNOSIS — E1165 Type 2 diabetes mellitus with hyperglycemia: Secondary | ICD-10-CM

## 2016-10-12 HISTORY — DX: Reserved for concepts with insufficient information to code with codable children: IMO0002

## 2016-10-12 HISTORY — DX: Type 2 diabetes mellitus with other diabetic kidney complication: E11.29

## 2016-10-12 LAB — GLUCOSE, CAPILLARY
GLUCOSE-CAPILLARY: 181 mg/dL — AB (ref 65–99)
GLUCOSE-CAPILLARY: 139 mg/dL — AB (ref 65–99)
Glucose-Capillary: 121 mg/dL — ABNORMAL HIGH (ref 65–99)
Glucose-Capillary: 85 mg/dL (ref 65–99)

## 2016-10-12 LAB — CBC WITH DIFFERENTIAL/PLATELET
Basophils Absolute: 0 10*3/uL (ref 0.0–0.1)
Basophils Relative: 0 %
EOS ABS: 0.2 10*3/uL (ref 0.0–0.7)
EOS PCT: 2 %
HCT: 31.5 % — ABNORMAL LOW (ref 36.0–46.0)
Hemoglobin: 9.7 g/dL — ABNORMAL LOW (ref 12.0–15.0)
LYMPHS ABS: 2.1 10*3/uL (ref 0.7–4.0)
Lymphocytes Relative: 23 %
MCH: 28.8 pg (ref 26.0–34.0)
MCHC: 30.8 g/dL (ref 30.0–36.0)
MCV: 93.5 fL (ref 78.0–100.0)
Monocytes Absolute: 0.8 10*3/uL (ref 0.1–1.0)
Monocytes Relative: 9 %
Neutro Abs: 6 10*3/uL (ref 1.7–7.7)
Neutrophils Relative %: 66 %
PLATELETS: 236 10*3/uL (ref 150–400)
RBC: 3.37 MIL/uL — AB (ref 3.87–5.11)
RDW: 14.5 % (ref 11.5–15.5)
WBC: 9 10*3/uL (ref 4.0–10.5)

## 2016-10-12 MED ORDER — NEPRO/CARBSTEADY PO LIQD: 237.0000 mL | Freq: Two times a day (BID) | ORAL | 0 refills | Status: DC

## 2016-10-12 MED ORDER — LISINOPRIL 20 MG PO TABS
40.0000 mg | ORAL_TABLET | Freq: Every day | ORAL | Status: DC
  Administered 2016-10-12 – 2016-10-13 (×2): 40 mg via ORAL
  Filled 2016-10-12 (×2): qty 2

## 2016-10-12 MED ORDER — ONDANSETRON HCL 4 MG/2ML IJ SOLN
4.0000 mg | Freq: Four times a day (QID) | INTRAMUSCULAR | Status: DC | PRN
  Administered 2016-10-12: 4 mg via INTRAVENOUS
  Filled 2016-10-12: qty 2

## 2016-10-12 MED ORDER — DOXYCYCLINE HYCLATE 100 MG PO TABS
100.0000 mg | ORAL_TABLET | Freq: Two times a day (BID) | ORAL | Status: DC
  Administered 2016-10-12 – 2016-10-13 (×3): 100 mg via ORAL
  Filled 2016-10-12 (×3): qty 1

## 2016-10-12 MED ORDER — MUSCLE RUB 10-15 % EX CREA
1.0000 "application " | TOPICAL_CREAM | CUTANEOUS | Status: DC | PRN
  Administered 2016-10-12: 1 via TOPICAL
  Filled 2016-10-12: qty 85

## 2016-10-12 MED ORDER — DOXYCYCLINE HYCLATE 100 MG PO TABS: 100.0000 mg | ORAL_TABLET | Freq: Two times a day (BID) | ORAL | 0 refills | Status: AC

## 2016-10-12 NOTE — Discharge Summary (Addendum)
Physician Discharge Summary  Heather [name] AST:[id number] DOB: 1971-02-12 DOA: 10/10/2016  PCP: Howard Pouch, DO  Admit date: 10/10/2016 Discharge date: 10/13/2016  This is an addendum to discharge summary by Dr. Wynetta Emery on 10/17.  Patient was scheduled to be discharged home on 10/17 but held as she desaturated to 80s on room air on ambulation. Patient is requiring home O2 (1 L/m continuously). Please reassess for further oxygen requirement during outpatient visit.  Admitted From: Home  Disposition:  Home  Recommendations for Outpatient Follow-up:  1. Follow up with PCP in 3-5 days for recheck. Completes antibiotics on 10/24.( total 8 days course) 2. Please follow up with nephrologist in 1 week  Discharge Condition: STABLE CODE STATUS: FULL Diet recommendation: Renal / Carb Modified   Brief/Interim Summary: HPI: Heather [name] is a 45 y.o. female with medical history significant for hypertension, insulin-dependent diabetes mellitus, chronic kidney disease stage IV undergoing left upper extremity AV fistula creation, now presenting to the emergency department with 2 days of progressive dyspnea, chills, productive cough, and headache. Patient reports that she had been in her usual state of health until approximately 2 days ago when she developed a general sense of malaise with a mild headache and sinus congestion. By that evening, patient was having difficulty catching her breath with minimal exertion and had developed a cough productive of thick yellow sputum. Patient denies any facial pain or earache, and denies sore throat. She denies chest pain or palpitations, but notes some new bilateral ankle edema. There is no orthopnea. Patient denies any recent long distance travel or sick contacts. She initially felt that she was developing a sinus infection, but those symptoms have begun to ease off as her dyspnea, chills, malaise, and productive cough of all worsened. Just prior to arrival, the  patient had become dyspneic while at rest and so she came into the ED for evaluation of this.  ED Course: Upon arrival to the ED, patient is found to be afebrile, saturating in the mid 80s on room air, tachycardic in the low 100s, and with vitals otherwise stable. EKG demonstrates a sinus rhythm with low voltage QRS and chest x-rays notable for airspace disease in the right upper lobe, right lower lobe, and left upper lobe consistent with pneumonia. Chemistry panel features a BUN of 38 and serum creatinine of 3.12 which appears to be consistent with her baseline. CBC features a stable normocytic anemia with hemoglobin 10.7 and a new leukocytosis to 16,400. Lactic acid is reassuring at 1.42. Blood cultures were obtained and the patient was treated with 3.5 L of normal saline, DuoNeb, supplemental oxygen, Rocephin, and Tylenol. Given her considerable comorbidity and new supplemental oxygen requirement, patient will be admitted to the telemetry unit for ongoing evaluation and management of apparent community-acquired pneumonia with acute hypoxic respiratory failure.  Hospital course: Heather Levay Scottis a 45 y.o.femalewith medical history significant for hypertension, insulin-dependent diabetes mellitus, chronic kidney disease stage IV undergoing left upper extremity AV fistula creation, now presenting to the emergency department with 2 days of progressive dyspnea, chills, productive cough, and headache.  Assessment & Plan: 1. CAP with acute hypoxic respiratory failure  - Presents with hypoxia, productive cough, rhonchi, leukocytosis, and CXR findings consistent with PNA  - Lactic acid reassuring at 1.42; there is no confusion or AKI  - Blood cultures are incubating, sputum culture and gram stain requested; check urine for strep pneumo antigen  - Given 3.5 L NS in ED, will SLIV now and monitor fluid  status  - Empiric Rocephin initiated in ED, will continue with the addition of azithromycin.  Transitioned to oral doxycycline and will discharge with 7 more days.  Close follow up with PCP and nephrologist recommended.   2. CKD stage IV - Followed by Dr. Jimmy Footman of nephrology and preparing for HD - Undergoing LUE AVF placement with Dr. [name] of vascular, first phase on 08/31/16  - SCr is 3.12 on admission, and this appears to be her baseline  - She was given 3.5 L NS in ED; SLIV now and follow strict I/O's, resumed home lasix doses.     3. Insulin dependent DM  - A1c 9.0% in September 2017  - At home, she is managed with Tresiba 26 units at lunch, and Novolog 10 units with breakfast and dinner, and 15 units at lunch; will hold these here  - Check CBG with meals and qHS  - Resume home insulin regimen at discharge.  Follow BS closely and report to PCP.   4. Hypertension  - At goal currently  - Continue lisinopril   5. Anemia of chronic disease - Hgb 10.7 on admission, stable from priors and with no bleeding  - Secondary to CKD and managed with Aranesp   DVT prophylaxis:sq heparin Code Status:Full  Family Communication:Discussed with patient, husband at bedside Disposition Plan:Admit to telemetry Admission status:Inpatient   Discharge Diagnoses:  Principal Problem:   PNA (pneumonia) Active Problems:   DM (diabetes mellitus), type 2, uncontrolled, with renal complications ([place])   Diabetes mellitus, type II, insulin dependent ([place])   Essential hypertension   Anemia in chronic kidney disease   Chronic kidney disease, stage IV (severe) (HCC)   Acute hypoxemic respiratory failure ([place])   Community acquired pneumonia  Discharge Instructions     Medication List    STOP taking these medications   cephALEXin 500 MG capsule Commonly known as:  KEFLEX   oxyCODONE-acetaminophen 5-325 MG tablet Commonly known as:  PERCOCET/ROXICET     TAKE these medications   calcitRIOL 0.25 MCG capsule Commonly known as:  ROCALTROL Take 0.5 mcg by mouth at bedtime.    doxycycline 100 MG tablet Commonly known as:  VIBRA-TABS Take 1 tablet (100 mg total) by mouth 2 (two) times daily.   epoetin alfa 10000 UNIT/ML injection Commonly known as:  EPOGEN,PROCRIT Inject 30,000 Units into the vein every 14 (fourteen) days.   feeding supplement (NEPRO CARB STEADY) Liqd Take 237 mLs by mouth 2 (two) times daily between meals.   furosemide 80 MG tablet Commonly known as:  LASIX Take 1 tablet (80 mg total) by mouth 2 (two) times daily. What changed:  how much to take  when to take this  reasons to take this   gabapentin 300 MG capsule Commonly known as:  NEURONTIN Take 1 capsule (300 mg total) by mouth 2 (two) times daily as needed. What changed:  how much to take  when to take this   glucose blood test strip Commonly known as:  ONETOUCH VERIO Test 4 times daily. What changed:  how much to take  how to take this  when to take this  additional instructions   insulin aspart 100 UNIT/ML FlexPen Commonly known as:  NOVOLOG Inject 10-15 Units into the skin See admin instructions. Inject 10 units subque in the morning and inject 15 units subqu at lunch and dinner   lisinopril 40 MG tablet Commonly known as:  PRINIVIL,ZESTRIL Take 1 tablet (40 mg total) by mouth daily.   TRESIBA FLEXTOUCH  100 UNIT/ML Sopn FlexTouch Pen Generic drug:  insulin degludec Inject 26 Units into the skin daily with lunch.      Follow-up Information    Howard Pouch, DO. Schedule an appointment as soon as possible for a visit in 3 day(s).   Specialty:  Family Medicine Why:  Hospital Follow Up  Contact information: [address] [place] [address] [phone]        DETERDING,JAMES L, MD. Schedule an appointment as soon as possible for a visit in 1 week(s).   Specialty:  Nephrology Contact information: [address] [place] [address] [phone]          Allergies  Allergen Reactions  . Ibuprofen Other (See Comments)    CKD stage 3.  Should avoid.  . Dilaudid [Hydromorphone Hcl] Itching and Other (See Comments)    Can take with benadryl   Procedures/Studies: Dg Chest 2 View  Result Date: 10/10/2016 CLINICAL DATA:  Shortness of breath, cough chest pain and fever for 2 days. EXAM: CHEST  2 VIEW COMPARISON:  01/09/2015 and prior radiographs FINDINGS: Right upper lobe, right lower lobe, and left upper lobe airspace disease identified and likely representing pneumonia. Upper limits normal heart size again noted. No pleural effusion, pneumothorax or acute bony abnormality identified. A moderate to severe apex right thoracolumbar scoliosis again noted. IMPRESSION: Right upper lobe, right lower lobe and left upper lobe airspace disease likely representing pneumonia. Asymmetric edema is considered less likely. Radiographic follow-up to resolution recommended. Electronically Signed   By: Margarette Canada M.D.   On: 10/10/2016 12:25     Subjective: Pt tolerating oral antibiotics, ate breakfast well with no problems, ambulating in room, will discharge home with close outpatient follow up.   Discharge Exam: Vitals:   10/12/16 0434 10/12/16 1036  BP: (!) 145/92 (!) 168/79  Pulse: 91 100  Resp: 18   Temp: 98.1 F (36.7 C)    Vitals:   10/12/16 0947 10/12/16 0950 10/12/16 0954 10/12/16 1036  BP:    (!) 168/79  Pulse:    100  Resp:      Temp:      TempSrc:      SpO2: 93% (!) 83% 96%   Weight:      Height:         General exam: Awake, alert no apparent distress.[name] Respiratory system: rhonchi heard on right, no wheezing. No increased work of breathing. Cardiovascular system: S1 & S2 heard, RRR. No JVD, murmurs, gallops, clicks or pedal edema. Gastrointestinal system: Abdomen is nondistended, soft and nontender. Normal bowel sounds heard. Central nervous system: Alert and oriented. No focal neurological deficits. Extremities: no cyanosis, trace pretibial edema.   The results of significant diagnostics from this  hospitalization (including imaging, microbiology, ancillary and laboratory) are listed below for reference.     Microbiology: Recent Results (from the past 240 hour(s))  Blood Culture (routine x 2)     Status: None (Preliminary result)   Collection Time: 10/10/16  3:30 PM  Result Value Ref Range Status   Specimen Description BLOOD RIGHT ARM  Final   Special Requests BOTTLES DRAWN AEROBIC AND ANAEROBIC 5CC EA  Final   Culture   Final    NO GROWTH < 24 HOURS Performed at [facility]    Report Status PENDING  Incomplete  Blood Culture (routine x 2)     Status: None (Preliminary result)   Collection Time: 10/10/16  3:57 PM  Result Value Ref Range Status   Specimen  Description BLOOD LEFT HAND  Final   Special Requests IN PEDIATRIC BOTTLE [place]  Final   Culture   Final    NO GROWTH < 24 HOURS Performed at [facility]    Report Status PENDING  Incomplete     Labs: BNP (last 3 results) No results for input(s): BNP in the last 8760 hours. Basic Metabolic Panel:  Recent Labs Lab 10/10/16 1530 10/11/16 0445  NA 135 137  K 4.3 4.7  CL 104 109  CO2 23 21*  GLUCOSE 224* 206*  BUN 38* 36*  CREATININE 3.12* 3.01*  CALCIUM 8.7* 7.8*   Liver Function Tests:  Recent Labs Lab 10/10/16 1530  AST 23  ALT 27  ALKPHOS 93  BILITOT 0.8  PROT 7.9  ALBUMIN 3.5   No results for input(s): LIPASE, AMYLASE in the last 168 hours. No results for input(s): AMMONIA in the last 168 hours. CBC:  Recent Labs Lab 10/10/16 1530 10/11/16 0445 10/12/16 0455  WBC 16.4* 10.8* 9.0  NEUTROABS 13.5* 8.0* 6.0  HGB 10.7* 8.9* 9.7*  HCT 32.3* 27.8* 31.5*  MCV 89.7 90.8 93.5  PLT 281 215 236   Cardiac Enzymes: No results for input(s): CKTOTAL, CKMB, CKMBINDEX, TROPONINI in the last 168 hours. BNP: Invalid input(s): POCBNP CBG:  Recent Labs Lab 10/11/16 0732 10/11/16 1223 10/11/16 1831 10/11/16 2129 10/12/16 0829  GLUCAP 157* 232* 162* 193* 181*   D-Dimer No results  for input(s): DDIMER in the last 72 hours. Hgb A1c No results for input(s): HGBA1C in the last 72 hours. Lipid Profile No results for input(s): CHOL, HDL, LDLCALC, TRIG, CHOLHDL, LDLDIRECT in the last 72 hours. Thyroid function studies No results for input(s): TSH, T4TOTAL, T3FREE, THYROIDAB in the last 72 hours.  Invalid input(s): FREET3 Anemia work up No results for input(s): VITAMINB12, FOLATE, FERRITIN, TIBC, IRON, RETICCTPCT in the last 72 hours. Urinalysis    Component Value Date/Time   COLORURINE YELLOW 09/07/2015 1230   APPEARANCEUR CLEAR 09/07/2015 1230   LABSPEC 1.019 09/07/2015 1230   PHURINE 6.0 09/07/2015 1230   GLUCOSEU >1000 (A) 09/07/2015 1230   HGBUR TRACE (A) 09/07/2015 1230   BILIRUBINUR NEGATIVE 09/07/2015 1230   BILIRUBINUR negative 04/17/2015 1149   BILIRUBINUR neg 08/08/2013   KETONESUR NEGATIVE 09/07/2015 1230   PROTEINUR 100 (A) 09/07/2015 1230   UROBILINOGEN 0.2 09/07/2015 1230   NITRITE NEGATIVE 09/07/2015 1230   LEUKOCYTESUR NEGATIVE 09/07/2015 1230   Sepsis Labs Invalid input(s): PROCALCITONIN,  WBC,  LACTICIDVEN Microbiology Recent Results (from the past 240 hour(s))  Blood Culture (routine x 2)     Status: None (Preliminary result)   Collection Time: 10/10/16  3:30 PM  Result Value Ref Range Status   Specimen Description BLOOD RIGHT ARM  Final   Special Requests BOTTLES DRAWN AEROBIC AND ANAEROBIC 5CC EA  Final   Culture   Final    NO GROWTH < 24 HOURS Performed at [facility]    Report Status PENDING  Incomplete  Blood Culture (routine x 2)     Status: None (Preliminary result)   Collection Time: 10/10/16  3:57 PM  Result Value Ref Range Status   Specimen Description BLOOD LEFT HAND  Final   Special Requests IN PEDIATRIC BOTTLE [place]  Final   Culture   Final    NO GROWTH < 24 HOURS Performed at [facility]    Report Status PENDING  Incomplete   Time coordinating discharge: 26 minutes  SIGNED:  Irwin Brakeman,  MD  [facility] 10/12/2016, 11:35 AM Pager   If 7PM-7AM, please contact night-coverage [email or web address] Password [email or web address]

## 2016-10-12 NOTE — Progress Notes (Addendum)
10/12/2016 11:54 AM  RN reports that patient desatting to 80s with ambulating, difficulty weaning oxygen in hospital.   Will work on trying to get patient back to baseline status.  Ordered for home oxygen.  Will discontinue discharge. Reassess tomorrow.    Murvin Natal, MD

## 2016-10-12 NOTE — Discharge Instructions (Signed)
Community-Acquired Pneumonia, Adult Pneumonia is an infection of the lungs. One type of pneumonia can happen while a person is in a hospital. A different type can happen when a person is not in a hospital (community-acquired pneumonia). It is easy for this kind to spread from person to person. It can spread to you if you breathe near an infected person who coughs or sneezes. Some symptoms include:  A dry cough.  A wet (productive) cough.  Fever.  Sweating.  Chest pain. HOME CARE  Take over-the-counter and prescription medicines only as told by your doctor.  Only take cough medicine if you are losing sleep.  If you were prescribed an antibiotic medicine, take it as told by your doctor. Do not stop taking the antibiotic even if you start to feel better.  Sleep with your head and neck raised (elevated). You can do this by putting a few pillows under your head, or you can sleep in a recliner.  Do not use tobacco products. These include cigarettes, chewing tobacco, and e-cigarettes. If you need help quitting, ask your doctor.  Drink enough water to keep your pee (urine) clear or pale yellow. A shot (vaccine) can help prevent pneumonia. Shots are often suggested for:  People older than 47 years of age.  People older than 47 years of age:  Who are having cancer treatment.  Who have long-term (chronic) lung disease.  Who have problems with their body's defense system (immune system). You may also prevent pneumonia if you take these actions:  Get the flu (influenza) shot every year.  Go to the dentist as often as told.  Wash your hands often. If soap and water are not available, use hand sanitizer. GET HELP IF:  You have a fever.  You lose sleep because your cough medicine does not help. GET HELP RIGHT AWAY IF:  You are short of breath and it gets worse.  You have more chest pain.  Your sickness gets worse. This is very serious if:  You are an older adult.  Your  body's defense system is weak.  You cough up blood.   This information is not intended to replace advice given to you by your health care provider. Make sure you discuss any questions you have with your health care provider.   Document Released: 05/31/2008 Document Revised: 09/03/2015 Document Reviewed: 04/09/2015 Elsevier Interactive Patient Education 2016 Else[employer]     Hyperglycemia High blood sugar (hyperglycemia) means that the level of sugar in your blood is higher than it should be. Signs of high blood sugar include:  Feeling thirsty.  Frequent peeing (urinating).  Feeling tired or sleepy.  Dry mouth.  Vision changes.  Feeling weak.  Feeling hungry but losing weight.  Numbness and tingling in your hands or feet.  Headache. When you ignore these signs, your blood sugar may keep going up. These problems may get worse, and other problems may begin. HOME CARE  Check your blood sugars as told by your doctor. Write down the numbers with the date and time.  Take the right amount of insulin or diabetes pills at the right time. Write down the dose with date and time.  Refill your insulin or diabetes pills before running out.  Watch what you eat. Follow your meal plan.  Drink liquids without sugar, such as water. Check with your doctor if you have kidney or heart disease.  Follow your doctor's orders for exercise. Exercise at the same time of day.  Keep your doctor's  appointments. GET HELP RIGHT AWAY IF:   You have trouble thinking or are confused.  You have fast breathing with fruity smelling breath.  You pass out (faint).  You have 2 to 3 days of high blood sugars and you do not know why.  You have chest pain.  You are feeling sick to your stomach (nauseous) or throwing up (vomiting).  You have sudden vision changes. MAKE SURE YOU:   Understand these instructions.  Will watch your condition.  Will get help right away if you are not doing well or  get worse.   This information is not intended to replace advice given to you by your health care provider. Make sure you discuss any questions you have with your health care provider.   Document Released: 10/10/2009 Document Revised: 01/03/2015 Document Reviewed: 08/19/2015 Elsevier Interactive Patient Education 2016 Elsevier Inc.  Blood Glucose Monitoring, Adult Monitoring your blood glucose (also know as blood sugar) helps you to manage your diabetes. It also helps you and your health care provider monitor your diabetes and determine how well your treatment plan is working. WHY SHOULD YOU MONITOR YOUR BLOOD GLUCOSE?  It can help you understand how food, exercise, and medicine affect your blood glucose.  It allows you to know what your blood glucose is at any given moment. You can quickly tell if you are having low blood glucose (hypoglycemia) or high blood glucose (hyperglycemia).  It can help you and your health care provider know how to adjust your medicines.  It can help you understand how to manage an illness or adjust medicine for exercise. WHEN SHOULD YOU TEST? Your health care provider will help you decide how often you should check your blood glucose. This may depend on the type of diabetes you have, your diabetes control, or the types of medicines you are taking. Be sure to write down all of your blood glucose readings so that this information can be reviewed with your health care provider. See below for examples of testing times that your health care provider may suggest. Type 1 Diabetes  Test at least 2 times per day if your diabetes is well controlled, if you are using an insulin pump, or if you perform multiple daily injections.  If your diabetes is not well controlled or if you are sick, you may need to test more often.  It is a good idea to also test:  Before every insulin injection.  Before and after exercise.  Between meals and 2 hours after a meal.  Occasionally  between 2:00 a.m. and 3:00 a.m. Type 2 Diabetes  If you are taking insulin, test at least 2 times per day. However, it is best to test before every insulin injection.  If you take medicines by mouth (orally), test 2 times a day.  If you are on a controlled diet, test once a day.  If your diabetes is not well controlled or if you are sick, you may need to monitor more often. HOW TO MONITOR YOUR BLOOD GLUCOSE Supplies Needed  Blood glucose meter.  Test strips for your meter. Each meter has its own strips. You must use the strips that go with your own meter.  A pricking needle (lancet).  A device that holds the lancet (lancing device).  A journal or log book to write down your results. Procedure  Wash your hands with soap and water. Alcohol is not preferred.  Prick the side of your finger (not the tip) with the lancet.  Gently  milk the finger until a small drop of blood appears.  Follow the instructions that come with your meter for inserting the test strip, applying blood to the strip, and using your blood glucose meter. Other Areas to Get Blood for Testing Some meters allow you to use other areas of your body (other than your finger) to test your blood. These areas are called alternative sites. The most common alternative sites are:  The forearm.  The thigh.  The back area of the lower leg.  The palm of the hand. The blood flow in these areas is slower. Therefore, the blood glucose values you get may be delayed, and the numbers are different from what you would get from your fingers. Do not use alternative sites if you think you are having hypoglycemia. Your reading will not be accurate. Always use a finger if you are having hypoglycemia. Also, if you cannot feel your lows (hypoglycemia unawareness), always use your fingers for your blood glucose checks. ADDITIONAL TIPS FOR GLUCOSE MONITORING  Do not reuse lancets.  Always carry your supplies with you.  All blood  glucose meters have a 24-hour "hotline" number to call if you have questions or need help.  Adjust (calibrate) your blood glucose meter with a control solution after finishing a few boxes of strips. BLOOD GLUCOSE RECORD KEEPING It is a good idea to keep a daily record or log of your blood glucose readings. Most glucose meters, if not all, keep your glucose records stored in the meter. Some meters come with the ability to download your records to your home computer. Keeping a record of your blood glucose readings is especially helpful if you are wanting to look for patterns. Make notes to go along with the blood glucose readings because you might forget what happened at that exact time. Keeping good records helps you and your health care provider to work together to achieve good diabetes management.    This information is not intended to replace advice given to you by your health care provider. Make sure you discuss any questions you have with your health care provider.   Document Released: 12/16/2003 Document Revised: 01/03/2015 Document Reviewed: 05/07/2013 Elsevier Interactive Patient Education 2016 [employer].   Hypoglycemia Low blood sugar (hypoglycemia) means that the level of sugar in your blood is lower than it should be. Signs of low blood sugar include:  Getting sweaty.  Feeling hungry.  Feeling dizzy or weak.  Feeling sleepier than normal.  Feeling nervous.  Headaches.  Having a fast heartbeat. Low blood sugar can happen fast and can be an emergency. Your doctor can do tests to check your blood sugar level. You can have low blood sugar and not have diabetes. HOME CARE  Check your blood sugar as told by your doctor. If it is less than 70 mg/dl or as told by your doctor, take 1 of the following:  3 to 4 glucose tablets.   cup clear juice.   cup soda pop, not diet.  1 cup milk.  5 to 6 hard candies.  Recheck blood sugar after 15 minutes. Repeat until it is at  the right level.  Eat a snack if it is more than 1 hour until the next meal.  Only take medicine as told by your doctor.  Do not skip meals. Eat on time.  Do not drink alcohol except with meals.  Check your blood glucose before driving.  Check your blood glucose before and after exercise.  Always carry treatment  with you, such as glucose pills.  Always wear a medical alert bracelet if you have diabetes. GET HELP RIGHT AWAY IF:   Your blood glucose goes below 70 mg/dl or as told by your doctor, and you:  Are confused.  Are not able to swallow.  Pass out (faint).  You cannot treat yourself. You may need someone to help you.  You have low blood sugar problems often.  You have problems from your medicines.  You are not feeling better after 3 to 4 days.  You have vision changes. MAKE SURE YOU:   Understand these instructions.  Will watch this condition.  Will get help right away if you are not doing well or get worse.   This information is not intended to replace advice given to you by your health care provider. Make sure you discuss any questions you have with your health care provider.   Document Released: 03/09/2010 Document Revised: 01/03/2015 Document Reviewed: 08/19/2015 Elsevier Interactive Patient Education [employer].

## 2016-10-12 NOTE — Progress Notes (Signed)
SATURATION QUALIFICATIONS: (This note is used to comply with regulatory documentation for home oxygen)  Patient Saturations on Room Air at Rest = 95%  Patient Saturations on Room Air while Ambulating = 83%  Patient Saturations on 1 Liters of oxygen while Ambulating = 94%  Please briefly explain why patient needs home oxygen:

## 2016-10-12 NOTE — Progress Notes (Signed)
PHARMACY NOTE -  Rocephin  Pharmacy has been assisting with dosing of ceftriaxone for CAP. Dosage remains stable at 2g IV q24 hr and need for further dosage adjustment appears unlikely at present.    Will sign off at this time.  Please reconsult if a change in clinical status warrants re-evaluation of dosage.  Reuel Boom, PharmD, BCPS Pager: [phone] 10/12/2016, 8:11 AM

## 2016-10-12 NOTE — Care Management Note (Signed)
Case Management Note  Patient Details  Name: Heather [name] MRN: [id number] Date of Birth: 11/07/1971  Subjective/Objective: Noted desats on 02-if home needed can arrange w/documented 02 sats,& home 02 order if qualifies.                   Action/Plan:d/c plan home.   Expected Discharge Date:                  Expected Discharge Plan:  Home/Self Care  In-House Referral:     Discharge planning Services  CM Consult  Post Acute Care Choice:    Choice offered to:     DME Arranged:    DME Agency:     HH Arranged:    HH Agency:     Status of Service:  In process, will continue to follow  If discussed at Long Length of Stay Meetings, dates discussed:    Additional Comments:  Dessa Phi, RN 10/12/2016, 10:34 AM

## 2016-10-13 ENCOUNTER — Telehealth: Payer: Self-pay | Admitting: Family Medicine

## 2016-10-13 DIAGNOSIS — J9601 Acute respiratory failure with hypoxia: Secondary | ICD-10-CM

## 2016-10-13 DIAGNOSIS — N184 Chronic kidney disease, stage 4 (severe): Secondary | ICD-10-CM

## 2016-10-13 DIAGNOSIS — J181 Lobar pneumonia, unspecified organism: Principal | ICD-10-CM

## 2016-10-13 LAB — GLUCOSE, CAPILLARY
GLUCOSE-CAPILLARY: 246 mg/dL — AB (ref 65–99)
Glucose-Capillary: 188 mg/dL — ABNORMAL HIGH (ref 65–99)

## 2016-10-13 MED ORDER — POLYETHYLENE GLYCOL 3350 17 G PO PACK
17.0000 g | PACK | Freq: Every day | ORAL | Status: DC
  Administered 2016-10-13: 17 g via ORAL
  Filled 2016-10-13: qty 1

## 2016-10-13 MED ORDER — POLYETHYLENE GLYCOL 3350 17 G PO PACK: 17.0000 g | PACK | Freq: Every day | ORAL | 0 refills | Status: DC | PRN

## 2016-10-13 NOTE — Progress Notes (Signed)
[facility] PROGRESS NOTE  NALINI ALCARAZ GXQ:[id number] DOB: [birth year] DOA: 10/10/2016 PCP: Howard Pouch, DO  Please refer to discharge summary from 48/56. 45 year old obese female with hypertension, diabetes mellitus, CAD status for admitted with acute respiratory failure secondary to lobar pneumonia. Patient improved and scheduled to be discharged on 10/17 but desaturated to 80s so discharge was canceled.  Assessment/Plan: Lobar pneumonia with acute hypoxic respiratory failure Cultures negative. Received Rocephin and azithromycin while in the hospital. Transition to oral doxycycline for 7 more days. Symptoms better. Drops O2 sat to 80s on room air on ambulation, will need home oxygen upon discharge.  Chronic kidney disease stage IV Undergoing LUE AVF placement. Follows with Dr. Jimmy Footman. Renal function at baseline. Continue home Lasix.  Uncontrolled insulin-dependent diabetes mellitus   A1c of 9. Continue home regimen.   Essential hypertension Stable. Continue lisinopril.  Anemia of chronic disease Secondary to chronic kidney disease. Stable. Getting Aranesp as outpatient.  Patient stable to discharge home with outpatient follow-up  Code Status: Full code Family Communication: None at bedside Disposition Plan: Home on home O2   Consultants:  None  Procedures:  None  Antibiotics:  Oral doxycycline on 210/24  HPI/Subjective: Continues to feel better. Oxygen desats to 86% on room air on ambulation, improved to 94% on 1 L.  Objective: Vitals:   10/13/16 0521 10/13/16 1120  BP: (!) 145/73 (!) 158/72  Pulse: 90   Resp: 18   Temp: 98.1 F (36.7 C)     Intake/Output Summary (Last 24 hours) at 10/13/16 1254 Last data filed at 10/13/16 1123  Gross per 24 hour  Intake              720 ml  Output             1650 ml  Net             -930 ml   Filed Weights   10/11/16 0452 10/12/16 0434 10/13/16 0521  Weight: 110.8 kg (244 lb 4.3 oz) 111.1 kg (244 lb  14.9 oz) 110.5 kg (243 lb 9.7 oz)    Exam:   General:  Middle aged obese female not in distress  HEENT: moist mucosa, supple neck  Chest: Clear bilaterally  CVS: Normal S1 and S2, no murmurs rub or gallop  GI: Soft, nondistended, nontender  Musculoskeletal: Warm, no edema    Data Reviewed: Basic Metabolic Panel:  Recent Labs Lab 10/10/16 1530 10/11/16 0445  NA 135 137  K 4.3 4.7  CL 104 109  CO2 23 21*  GLUCOSE 224* 206*  BUN 38* 36*  CREATININE 3.12* 3.01*  CALCIUM 8.7* 7.8*   Liver Function Tests:  Recent Labs Lab 10/10/16 1530  AST 23  ALT 27  ALKPHOS 93  BILITOT 0.8  PROT 7.9  ALBUMIN 3.5   No results for input(s): LIPASE, AMYLASE in the last 168 hours. No results for input(s): AMMONIA in the last 168 hours. CBC:  Recent Labs Lab 10/10/16 1530 10/11/16 0445 10/12/16 0455  WBC 16.4* 10.8* 9.0  NEUTROABS 13.5* 8.0* 6.0  HGB 10.7* 8.9* 9.7*  HCT 32.3* 27.8* 31.5*  MCV 89.7 90.8 93.5  PLT 281 215 236   Cardiac Enzymes: No results for input(s): CKTOTAL, CKMB, CKMBINDEX, TROPONINI in the last 168 hours. BNP (last 3 results) No results for input(s): BNP in the last 8760 hours.  ProBNP (last 3 results) No results for input(s): PROBNP in the last 8760 hours.  CBG:  Recent Labs Lab 10/12/16 1156 10/12/16  1733 10/12/16 2135 10/13/16 0826 10/13/16 1214  GLUCAP 139* 121* 85 188* 246*    Recent Results (from the past 240 hour(s))  Blood Culture (routine x 2)     Status: None (Preliminary result)   Collection Time: 10/10/16  3:30 PM  Result Value Ref Range Status   Specimen Description BLOOD RIGHT ARM  Final   Special Requests BOTTLES DRAWN AEROBIC AND ANAEROBIC 5CC EA  Final   Culture   Final    NO GROWTH 2 DAYS Performed at [facility]    Report Status PENDING  Incomplete  Blood Culture (routine x 2)     Status: None (Preliminary result)   Collection Time: 10/10/16  3:57 PM  Result Value Ref Range Status   Specimen  Description BLOOD LEFT HAND  Final   Special Requests IN PEDIATRIC BOTTLE [place]  Final   Culture   Final    NO GROWTH 2 DAYS Performed at [facility]    Report Status PENDING  Incomplete     Studies: No results found.  Scheduled Meds: . calcitRIOL  0.5 mcg Oral QHS  . doxycycline  100 mg Oral BID  . feeding supplement (NEPRO CARB STEADY)  237 mL Oral BID BM  . furosemide  80 mg Oral BID  . gabapentin  300 mg Oral Daily  . heparin  5,000 Units Subcutaneous Q8H  . insulin aspart  0-5 Units Subcutaneous QHS  . insulin aspart  0-9 Units Subcutaneous TID WC  . insulin aspart  8 Units Subcutaneous TID WC  . insulin glargine  20 Units Subcutaneous Daily  . lisinopril  40 mg Oral Daily  . mouth rinse  15 mL Mouth Rinse BID  . polyethylene glycol  17 g Oral Daily  . sodium chloride flush  3 mL Intravenous Q12H   Continuous Infusions:     Time spent: 25 minutes    Maverick Dieudonne, [place]  [facility] Pager [phone] If 7PM-7AM, please contact night-coverage at [email or web address], password [facility] 10/13/2016, 12:54 PM  LOS: 3 days

## 2016-10-13 NOTE — Telephone Encounter (Signed)
Transition Care Management Follow-up Telephone Call   Date discharged? 10/13/2016   How have you been since you were released from the hospital? [place], but still SOB   Do you understand why you were in the hospital? yes   Do you understand the discharge instructions? yes   Where were you discharged to? Home with O2    Items Reviewed:  Medications reviewed: yes  Allergies reviewed: no  Dietary changes reviewed: no  Referrals reviewed: yes, pt says no referrals were made.    Functional Questionnaire:   Activities of Daily Living (ADLs):   She states they are independent in the following: none States they require assistance with the following: none   Any transportation issues/concerns?: no   Any patient concerns? no   Confirmed importance and date/time of follow-up visits scheduled yes  Provider Appointment booked with Dr Raoul Pitch, 10/18/2016 @ 10am.   Confirmed with patient if condition begins to worsen call PCP or go to the ER.  Patient was given the office number and encouraged to call back with question or concerns.  : yes

## 2016-10-13 NOTE — Progress Notes (Signed)
SATURATION QUALIFICATIONS: (This note is used to comply with regulatory documentation for home oxygen)  Patient Saturations on Room Air at Rest = 92%  Patient Saturations on Room Air while Ambulating = 86%  Patient Saturations on 1 Liters of oxygen while Ambulating = 94%  Please briefly explain why patient needs home oxygen:

## 2016-10-13 NOTE — Telephone Encounter (Signed)
noted 

## 2016-10-14 DIAGNOSIS — J181 Lobar pneumonia, unspecified organism: Secondary | ICD-10-CM

## 2016-10-14 NOTE — Progress Notes (Signed)
    Postoperative Access Visit   History of Present Illness  Heather [name] is a 45 y.o. 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  The patient's wounds are healed.  The patient notes no steal symptoms.  The patient is able to complete their activities of daily living.  The patient's current symptoms are: none.  For VQI Use Only  PRE-ADM LIVING: Home  AMB STATUS: Ambulatory  Physical Examination Vitals:   10/15/16 0840 10/15/16 0842  BP: (!) 189/99 (!) 187/103  Pulse: 90   Resp: 16   Temp: 98.2 F (36.8 C)     LUE: Incision is healed, skin feels warm, hand grip is 5/5, sensation in digits is  intact, palpable thrill, bruit can  be auscultated, palpable radial pulse, Fistula 5-5.5 mm in proximal 2/3 upper arm, 4-5 mm in distal 1/3 upper arm  Medical Decision Making  Heather [name] is a 45 y.o. year old female who presents s/p L 1st BRVT.   Will have pt follow up in one month for recheck on maturation  Thank you for allowing [name] to participate in this patient's care.  Adele Barthel, MD, FACS [facility] Office: [phone] Pager: [phone]

## 2016-10-15 ENCOUNTER — Encounter (HOSPITAL_COMMUNITY): Payer: Managed Care, Other (non HMO)

## 2016-10-15 ENCOUNTER — Encounter (HOSPITAL_COMMUNITY)
Admission: RE | Admit: 2016-10-15 | Discharge: 2016-10-15 | Disposition: A | Discharge status: Home / Self Care | Payer: Managed Care, Other (non HMO) | Source: Ambulatory Visit | Attending: Nephrology | Admitting: Nephrology

## 2016-10-15 ENCOUNTER — Ambulatory Visit (INDEPENDENT_AMBULATORY_CARE_PROVIDER_SITE_OTHER): Payer: Self-pay | Admitting: Vascular Surgery

## 2016-10-15 ENCOUNTER — Encounter: Payer: Self-pay | Admitting: Vascular Surgery

## 2016-10-15 VITALS — BP 187/103 | HR 90 | Temp 98.2°F | Resp 16 | Ht 68.0 in | Wt 239.0 lb

## 2016-10-15 DIAGNOSIS — N184 Chronic kidney disease, stage 4 (severe): Secondary | ICD-10-CM

## 2016-10-15 DIAGNOSIS — D509 Iron deficiency anemia, unspecified: Secondary | ICD-10-CM | POA: Diagnosis present

## 2016-10-15 DIAGNOSIS — N182 Chronic kidney disease, stage 2 (mild): Secondary | ICD-10-CM

## 2016-10-15 LAB — CULTURE, BLOOD (ROUTINE X 2)
Culture: NO GROWTH
Culture: NO GROWTH

## 2016-10-15 LAB — POCT HEMOGLOBIN-HEMACUE: Hemoglobin: 9 g/dL — ABNORMAL LOW (ref 12.0–15.0)

## 2016-10-15 MED ORDER — EPOETIN ALFA 10000 UNIT/ML IJ SOLN
INTRAMUSCULAR | Status: AC
  Administered 2016-10-15: 10000 [IU] via SUBCUTANEOUS
  Filled 2016-10-15: qty 1

## 2016-10-15 MED ORDER — EPOETIN ALFA 2000 UNIT/ML IJ SOLN
INTRAMUSCULAR | Status: AC
  Filled 2016-10-15: qty 1

## 2016-10-15 MED ORDER — EPOETIN ALFA 40000 UNIT/ML IJ SOLN
30000.0000 [IU] | INTRAMUSCULAR | Status: DC
  Administered 2016-10-15: 20000 [IU] via SUBCUTANEOUS

## 2016-10-18 ENCOUNTER — Encounter: Payer: Self-pay | Admitting: Family Medicine

## 2016-10-18 ENCOUNTER — Ambulatory Visit (INDEPENDENT_AMBULATORY_CARE_PROVIDER_SITE_OTHER): Payer: Managed Care, Other (non HMO) | Admitting: Family Medicine

## 2016-10-18 ENCOUNTER — Ambulatory Visit (HOSPITAL_BASED_OUTPATIENT_CLINIC_OR_DEPARTMENT_OTHER)
Admission: RE | Admit: 2016-10-18 | Discharge: 2016-10-18 | Disposition: A | Discharge status: Home / Self Care | Payer: Managed Care, Other (non HMO) | Source: Ambulatory Visit | Attending: Family Medicine | Admitting: Family Medicine

## 2016-10-18 VITALS — BP 166/95 | HR 90 | Temp 98.0°F | Resp 20 | Ht 68.0 in | Wt 235.2 lb

## 2016-10-18 DIAGNOSIS — J9601 Acute respiratory failure with hypoxia: Secondary | ICD-10-CM

## 2016-10-18 DIAGNOSIS — N184 Chronic kidney disease, stage 4 (severe): Secondary | ICD-10-CM

## 2016-10-18 DIAGNOSIS — D631 Anemia in chronic kidney disease: Secondary | ICD-10-CM

## 2016-10-18 DIAGNOSIS — J690 Pneumonitis due to inhalation of food and vomit: Secondary | ICD-10-CM

## 2016-10-18 DIAGNOSIS — Z9981 Dependence on supplemental oxygen: Secondary | ICD-10-CM

## 2016-10-18 DIAGNOSIS — R0902 Hypoxemia: Secondary | ICD-10-CM

## 2016-10-18 MED FILL — Epoetin Alfa Inj 10000 Unit/ML: INTRAMUSCULAR | Qty: 1 | Status: AC

## 2016-10-18 MED FILL — Epoetin Alfa Inj 20000 Unit/ML: INTRAMUSCULAR | Qty: 1 | Status: AC

## 2016-10-18 NOTE — Progress Notes (Signed)
Heather [name] , 01/03/1971, 45 y.o., female MRN: [id number] Patient Care Team    Relationship Specialty Notifications Start End  Ma Hillock, DO PCP - General Family Medicine  09/03/15   Gerarda Fraction, MD Referring Physician Ophthalmology  05/12/16   Mauricia Area, MD Consulting Physician Nephrology  08/27/16   Luvenia Starch, MD Referring Physician Internal Medicine  10/15/16     CC: TCM- recent hospitalization  Subjective: Pt presents for TCM follow up after hospitalization for aspiration pneumonia.   Patient was admitted 10/10/2016 and discharge 10/13/2016 for aspiration pneumonia, treated with IV fluids, Empiric Rocephin initiated in ED, azithromycin transitioned to oral doxycycline for a total of 10 day treatment.  Close follow up with PCP and nephrologist recommended.  . Patient was discharged from the hospital with a new oxygen requirement of 1L [place] continuously. [name] not wearing her oxygen today and states she feels winded and needs the oxygen with walking long distances only.  Patient states she is feeling much improved, denies fever or chills, and eating drinking well. She does feel she is "coughing" up mucous. [name] monitoring her glucose and states they are mid 100's. She has not scheduled with nephrology for follow up as suggested by DC summary. She does report her a1c has improved from 13--> 9 since starting with new endocrinologist. She has never been evaluated through pulmonology and she is receiving her oxygen [facility].   Allergies  Allergen Reactions  . Ibuprofen Other (See Comments)    CKD stage 3. Should avoid.  . Dilaudid [Hydromorphone Hcl] Itching and Other (See Comments)    Can take with benadryl   Social History  Substance Use Topics  . Smoking status: Never Smoker  . Smokeless tobacco: Never Used  . Alcohol use No   Past Medical History:  Diagnosis Date  . Allergy   . Anemia    chronic disease  . Blood transfusion without reported  diagnosis   . Chronic constipation   . Chronic kidney disease   . Diabetes mellitus    Type II  . Fibroids    leiomyoma 8/04 per [name]  . Fibromyalgia   . Gastroparesis    DM uncontrolled.   . Headache   . Hyperlipemia   . Hypertension   . IBS (irritable bowel syndrome)   . Infertility    eval at [facility]  . Obesity   . Pancreatitis    Past Surgical History:  Procedure Laterality Date  . BASCILIC VEIN TRANSPOSITION Left 08/31/2016   Procedure: BASILIC VEIN TRANSPOSITION FIRST STAGE;  Surgeon: Conrad Superior, MD;  Location: [place];  Service: Vascular;  Laterality: Left;  . EYE SURGERY     bilateral, vitrectomies  . fibroid tumors removed    . FOOT SURGERY     bilateral   Family History  Problem Relation Age of Onset  . Colon polyps Mother   . Diabetes Mother   . Heart murmur Father   . Hypertension Father   . Diabetes Sister   . Kidney failure Sister   . Diabetes Brother   . Retinal degeneration Brother   . Heart murmur Son   . Stroke Paternal Grandmother   . Diabetes Sister      Medication List       Accurate as of 10/18/16 10:47 AM. Always use your most recent med list.          calcitRIOL 0.25 MCG capsule Commonly known as:  ROCALTROL Take  0.5 mcg by mouth at bedtime.   doxycycline 100 MG tablet Commonly known as:  VIBRA-TABS Take 1 tablet (100 mg total) by mouth 2 (two) times daily.   epoetin alfa 10000 UNIT/ML injection Commonly known as:  EPOGEN,PROCRIT Inject 30,000 Units into the vein every 14 (fourteen) days.   feeding supplement (NEPRO CARB STEADY) Liqd Take 237 mLs by mouth 2 (two) times daily between meals.   furosemide 80 MG tablet Commonly known as:  LASIX Take 1 tablet (80 mg total) by mouth 2 (two) times daily.   gabapentin 300 MG capsule Commonly known as:  NEURONTIN Take 1 capsule (300 mg total) by mouth 2 (two) times daily as needed.   glucose blood test strip Commonly known as:  ONETOUCH VERIO Test 4 times daily.   insulin aspart  100 UNIT/ML FlexPen Commonly known as:  NOVOLOG Inject 10-15 Units into the skin See admin instructions. Inject 10 units subque in the morning and inject 15 units subqu at lunch and dinner   lisinopril 40 MG tablet Commonly known as:  PRINIVIL,ZESTRIL Take 1 tablet (40 mg total) by mouth daily.   polyethylene glycol packet Commonly known as:  MIRALAX / GLYCOLAX Take 17 g by mouth daily as needed.   TRESIBA FLEXTOUCH 100 UNIT/ML Sopn FlexTouch Pen Generic drug:  insulin degludec Inject 26 Units into the skin daily with lunch.       No results found for this or any previous visit (from the past 24 hour(s)). No results found.   ROS: Negative, with the exception of above mentioned in HPI   Objective:  BP (!) 166/95 (BP Location: Right Arm, Patient Position: Sitting, Cuff Size: Normal)   Pulse 90   Temp 98 F (36.7 C)   Resp 20   Ht 5\' 8"  (1.727 m)   Wt 235 lb 4 oz (106.7 kg)   LMP 09/12/2016   SpO2 96%   BMI 35.77 kg/m  Body mass index is 35.77 kg/m. Gen: Afebrile. No acute distress. Nontoxic in appearance, well developed, well nourished.  HENT: AT. [place]. MMM, no oral lesions. No cough or hoarseness on exam. Eyes:Pupils Equal Round Reactive to light, Extraocular movements intact,  Conjunctiva without redness, discharge or icterus. Neck/lymp/endocrine: Supple,no lymphadenopathy CV: RRR,  No edema Chest: CTAB, no wheeze or crackles. Good air movement, normal resp effort.  Abd: Soft. NTND. BS present.  Skin: no rashes, purpura or petechiae.  Neuro: Normal gait. PERLA. EOMi. Alert. Oriented x3  Psych: Normal affect, dress and demeanor. Normal speech. Normal thought content and judgment.  Assessment/Plan: Heather [name] is a 45 y.o. female present for TCM follow up after recent hospitalization.  Pneumonia (HCC)/Acute hypoxemic respiratory failure ([place]) - pt treated for pneumonia, improving, but feels sputum production has remained.  - New oxygen requirement after acute  respiratory failure. Saturations today on RA 96%. Pt still feels she needs the oxygen with walking distance.  - repeat cxr today. DG Chest 2 View; Future - Discussed referral to pulmonology to evaluate PFT and oxygen requirement.   Chronic kidney disease, stage IV (severe) (HCC)/ Anemia in stage 4 chronic kidney disease ([place]) - pt was unaware followup to nephrology was recommended within 1 week.  - office to office contact will be attempted to help her set this up. - Kidney function had been stable during hospitalization. - Blood pressure at vascular and today have been above goal. Pt will speak to her nephrologist concerning BP.   electronically signed by:  Howard Pouch, DO  [facility] - [place]

## 2016-10-18 NOTE — Patient Instructions (Signed)
Please have chest xray completed today or tomorrow.  Depending on those results, we may refer to pulmonology or extend antibiotics.  I am glad you are feeling better.

## 2016-10-19 ENCOUNTER — Telehealth: Payer: Self-pay | Admitting: Family Medicine

## 2016-10-19 DIAGNOSIS — N184 Chronic kidney disease, stage 4 (severe): Secondary | ICD-10-CM

## 2016-10-19 MED ORDER — LEVOFLOXACIN 500 MG PO TABS: ORAL_TABLET | ORAL | 0 refills | Status: DC

## 2016-10-19 NOTE — Telephone Encounter (Signed)
Please call pt: - there is still a small residual area of persistent pneumonia in her right lung. I would like to extend her abx treatment with levaquin.  - this medicine needs to be taken in a special way considering her kidney function.  - she is to take 1.5 pills day 1, day 3 and 5 she is to take 1 pill. It is very important to take as directed and not take a pill on the days not assigned (48 hours between each dose). - We will need to check her kidney function after completion of abx. I have placed a future order in for this to be done next week. If she is at her nephrologist then, she can have them do it, otherwise she will need to have a lab aoot only to have completed.  She will also need to have repeat cxr in 4 weeks.  - I referred her to pulmonary yesterday for her condition and new oxygen requirement for full eval. If she has pulmonary set up by the 4 weeks xray need,  she can have it completed there, if not she will need to be seen here at that time.

## 2016-10-19 NOTE — Telephone Encounter (Signed)
Spoke with patient reviewed results, detailed instructions, and referral information. Patient verbalized understanding of all instructions.

## 2016-10-19 NOTE — Telephone Encounter (Signed)
Left message for patient to return call to review results and instructions.

## 2016-10-26 ENCOUNTER — Emergency Department (HOSPITAL_BASED_OUTPATIENT_CLINIC_OR_DEPARTMENT_OTHER)
Admission: EM | Admit: 2016-10-26 | Discharge: 2016-10-26 | Disposition: A | Discharge status: Home / Self Care | Payer: Managed Care, Other (non HMO) | Attending: Physician Assistant | Admitting: Physician Assistant

## 2016-10-26 ENCOUNTER — Encounter (HOSPITAL_BASED_OUTPATIENT_CLINIC_OR_DEPARTMENT_OTHER): Payer: Self-pay | Admitting: Emergency Medicine

## 2016-10-26 DIAGNOSIS — I129 Hypertensive chronic kidney disease with stage 1 through stage 4 chronic kidney disease, or unspecified chronic kidney disease: Secondary | ICD-10-CM | POA: Insufficient documentation

## 2016-10-26 DIAGNOSIS — Z794 Long term (current) use of insulin: Secondary | ICD-10-CM | POA: Insufficient documentation

## 2016-10-26 DIAGNOSIS — Z79899 Other long term (current) drug therapy: Secondary | ICD-10-CM | POA: Insufficient documentation

## 2016-10-26 DIAGNOSIS — M545 Low back pain, unspecified: Secondary | ICD-10-CM

## 2016-10-26 DIAGNOSIS — N184 Chronic kidney disease, stage 4 (severe): Secondary | ICD-10-CM | POA: Diagnosis not present

## 2016-10-26 DIAGNOSIS — E1122 Type 2 diabetes mellitus with diabetic chronic kidney disease: Secondary | ICD-10-CM | POA: Diagnosis not present

## 2016-10-26 MED ORDER — OXYCODONE-ACETAMINOPHEN 5-325 MG PO TABS
1.0000 | ORAL_TABLET | Freq: Once | ORAL | Status: AC
  Administered 2016-10-26: 1 via ORAL
  Filled 2016-10-26: qty 1

## 2016-10-26 MED ORDER — OXYCODONE-ACETAMINOPHEN 5-325 MG PO TABS: 1.0000 | ORAL_TABLET | Freq: Four times a day (QID) | ORAL | 0 refills | Status: DC | PRN

## 2016-10-26 MED ORDER — CYCLOBENZAPRINE HCL 10 MG PO TABS: 10.0000 mg | ORAL_TABLET | Freq: Two times a day (BID) | ORAL | 0 refills | Status: DC | PRN

## 2016-10-26 NOTE — ED Provider Notes (Signed)
[place] DEPT [facility] Provider Note   CSN: [id number] Arrival date & time: 10/26/16  1431     History   Chief Complaint Chief Complaint  Patient presents with  . Back Pain    HPI Heather [name] is a 45 y.o. female.  HPI   Patient's a 45 year old female presenting with lower back pain with movement. Patient had recent admission to hospital for pneumonia. Patient reports feeling fatigued after this. [name] returned to work and felt like her back was bothering her too much. The pain does not radiate down either legs. No numbness. No fevers. No IV drug use. No weakness.  It only hurts with movement. No urinary symptoms.  Past Medical History:  Diagnosis Date  . Allergy   . Anemia    chronic disease  . Blood transfusion without reported diagnosis   . Chronic constipation   . Chronic kidney disease   . Diabetes mellitus    Type II  . Fibroids    leiomyoma 8/04 per [name]  . Fibromyalgia   . Gastroparesis    DM uncontrolled.   . Headache   . Hyperlipemia   . Hypertension   . IBS (irritable bowel syndrome)   . Infertility    eval at [facility]  . Obesity   . Pancreatitis     Patient Active Problem List   Diagnosis Date Noted  . DM (diabetes mellitus), type 2, uncontrolled, with renal complications ([place]) 80/99/8338  . Acute hypoxemic respiratory failure ([place]) 10/10/2016  . PNA (pneumonia) 10/10/2016  . Anemia of chronic disease 07/22/2016  . Maxillary sinusitis, acute 03/18/2016  . Right-sided low back pain without sciatica 12/08/2015  . Diabetic retinopathy ([place]) 09/29/2015  . Preventative health care 09/23/2014  . 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 B12 deficiency 05/10/2014  . Anemia in chronic kidney disease 04/26/2014  . Chronic kidney disease, stage IV (severe) ([place]) 04/26/2014  . Heart murmur, systolic 25/04/3975  . Secondary hyperthyroidism 11/27/2013  . Diabetic neuropathy, painful ([place]) 07/13/2013  . Leiomyoma of uterus, unspecified 02/19/2011    . Diabetes mellitus, type II, insulin dependent ([place]) 02/19/2011  . Essential hypertension 02/19/2011    Past Surgical History:  Procedure Laterality Date  . BASCILIC VEIN TRANSPOSITION Left 08/31/2016   Procedure: BASILIC VEIN TRANSPOSITION FIRST STAGE;  Surgeon: Conrad Oaklyn, MD;  Location: [place];  Service: Vascular;  Laterality: Left;  . EYE SURGERY     bilateral, vitrectomies  . fibroid tumors removed    . FOOT SURGERY     bilateral    OB History    Gravida Para Term Preterm AB Living   2 1     1 1    SAB TAB Ectopic Multiple Live Births   1               Home Medications    Prior to Admission medications   Medication Sig Start Date End Date Taking? Authorizing Provider  calcitRIOL (ROCALTROL) 0.25 MCG capsule Take 0.5 mcg by mouth at bedtime.     Historical Provider, MD  epoetin alfa (EPOGEN,PROCRIT) 73419 UNIT/ML injection Inject 30,000 Units into the vein every 14 (fourteen) days.     Historical Provider, MD  furosemide (LASIX) 80 MG tablet Take 1 tablet (80 mg total) by mouth 2 (two) times daily. Patient taking differently: Take 160 mg by mouth daily as needed for fluid.  04/12/15   Irene Pap, NP  gabapentin (NEURONTIN) 300 MG capsule Take 1 capsule (300 mg total)  by mouth 2 (two) times daily as needed. Patient taking differently: Take 900 mg by mouth daily.  10/07/15   Renee A Kuneff, DO  glucose blood (ONETOUCH VERIO) test strip Test 4 times daily. Patient taking differently: 1 each by Other route 3 (three) times daily.  07/17/15   Irene Pap, NP  insulin aspart (NOVOLOG) 100 UNIT/ML FlexPen Inject 10-15 Units into the skin See admin instructions. Inject 10 units subque in the morning and inject 15 units subqu at lunch and dinner 08/12/16   Historical Provider, MD  insulin degludec (TRESIBA FLEXTOUCH) 100 UNIT/ML SOPN FlexTouch Pen Inject 26 Units into the skin daily with lunch.    Historical Provider, MD  lisinopril (PRINIVIL,ZESTRIL) 40 MG tablet Take 1 tablet  (40 mg total) by mouth daily. 01/01/16   Renee A Raoul Pitch, DO    Family History Family History  Problem Relation Age of Onset  . Colon polyps Mother   . Diabetes Mother   . Heart murmur Father   . Hypertension Father   . Diabetes Sister   . Kidney failure Sister   . Diabetes Brother   . Retinal degeneration Brother   . Heart murmur Son   . Stroke Paternal Grandmother   . Diabetes Sister     Social History Social History  Substance Use Topics  . Smoking status: Never Smoker  . Smokeless tobacco: Never Used  . Alcohol use No     Allergies   Ibuprofen and Dilaudid [hydromorphone hcl]   Review of Systems Review of Systems  Constitutional: Negative for activity change.  Respiratory: Negative for shortness of breath.   Cardiovascular: Negative for chest pain.  Gastrointestinal: Negative for abdominal pain.  Genitourinary: Negative for difficulty urinating, dysuria, enuresis, flank pain and urgency.  Musculoskeletal: Positive for back pain.  All other systems reviewed and are negative.    Physical Exam Updated Vital Signs BP 191/87 (BP Location: Right Arm)   Pulse 94   Temp 98 F (36.7 C) (Oral)   Resp 18   Ht 5\' 8"  (1.727 m)   Wt 239 lb (108.4 kg)   LMP 10/12/2016   SpO2 97%   BMI 36.34 kg/m   Physical Exam  Constitutional: [name] oriented to person, place, and time. She appears well-developed and well-nourished.  HENT:  Head: Normocephalic and atraumatic.  Eyes: Right eye exhibits no discharge.  Cardiovascular: Normal rate and regular rhythm.   No murmur heard. Pulmonary/Chest: Effort normal and breath sounds normal. No respiratory distress.  Abdominal: Soft. There is no tenderness.  Musculoskeletal: Normal range of motion. She exhibits no edema or deformity.  No tenderness over L-spine. Tenderness in the deep muscle tissue lateral to the left side paraspinally. Worse with movement. No CVA tenderness.  Neurological: [name] oriented to person, place, and  time.  Skin: Skin is warm and dry. [name] not diaphoretic.  Psychiatric: She has a normal mood and affect.  Nursing note and vitals reviewed.    ED Treatments / Results  Labs (all labs ordered are listed, but only abnormal results are displayed) Labs Reviewed - No data to display  EKG  EKG Interpretation None       Radiology No results found.  Procedures Procedures (including critical care time)  Medications Ordered in ED Medications  oxyCODONE-acetaminophen (PERCOCET/ROXICET) 5-325 MG per tablet 1 tablet (not administered)     Initial Impression / Assessment and Plan / ED Course  I have reviewed the triage vital signs and the nursing notes.  Pertinent labs & imaging results that were available during my care of the patient were reviewed by me and considered in my medical decision making (see chart for details).  Clinical Course  Patient is a 45 year old female presenting with back pain. This back pain is only present with movement. This comes after recent hospitalization. I think it is likely musculoskeletal in nature. There are no red flag symptoms. No trauma. We will give patient pain relief with muscle relaxant. Patient allergic to ibuprofen so we'll give Percocet with Flexeril.  Final Clinical Impressions(s) / ED Diagnoses   Final diagnoses:  None    New Prescriptions New Prescriptions   No medications on file     Melaysia Streed Julio Alm, MD 10/26/16 1523

## 2016-10-26 NOTE — Discharge Instructions (Signed)
Please continue to use her back but use these medications to help with symptoms. Use hot or cold help with symptoms. Please return with any concerns, fever, urinary symptoms.

## 2016-10-26 NOTE — ED Triage Notes (Signed)
Pt having lower back pain since yesterday.  Pt denies injury.  Pt states rest relieves some pain but movement increases.  No numbness or tingling.

## 2016-10-28 ENCOUNTER — Other Ambulatory Visit (HOSPITAL_COMMUNITY): Payer: Self-pay | Admitting: *Deleted

## 2016-10-29 ENCOUNTER — Inpatient Hospital Stay (HOSPITAL_COMMUNITY): Admission: RE | Admit: 2016-10-29 | Payer: Managed Care, Other (non HMO) | Source: Ambulatory Visit

## 2016-11-04 ENCOUNTER — Encounter (HOSPITAL_COMMUNITY)
Admission: RE | Admit: 2016-11-04 | Discharge: 2016-11-04 | Disposition: A | Discharge status: Home / Self Care | Payer: Managed Care, Other (non HMO) | Source: Ambulatory Visit | Attending: Nephrology | Admitting: Nephrology

## 2016-11-04 DIAGNOSIS — N182 Chronic kidney disease, stage 2 (mild): Secondary | ICD-10-CM | POA: Insufficient documentation

## 2016-11-04 LAB — IRON AND TIBC
IRON: 93 ug/dL (ref 28–170)
Saturation Ratios: 43 % — ABNORMAL HIGH (ref 10.4–31.8)
TIBC: 214 ug/dL — AB (ref 250–450)
UIBC: 121 ug/dL

## 2016-11-04 LAB — POCT HEMOGLOBIN-HEMACUE: HEMOGLOBIN: 10.3 g/dL — AB (ref 12.0–15.0)

## 2016-11-04 LAB — FERRITIN: Ferritin: 1014 ng/mL — ABNORMAL HIGH (ref 11–307)

## 2016-11-04 MED ORDER — EPOETIN ALFA 40000 UNIT/ML IJ SOLN: 40000.0000 [IU] | INTRAMUSCULAR | Status: DC

## 2016-11-04 MED ORDER — EPOETIN ALFA 40000 UNIT/ML IJ SOLN
INTRAMUSCULAR | Status: AC
  Administered 2016-11-04: 40000 [IU]
  Filled 2016-11-04: qty 1

## 2016-11-11 ENCOUNTER — Encounter: Payer: Self-pay | Admitting: Vascular Surgery

## 2016-11-11 NOTE — Progress Notes (Signed)
    Postoperative Access Visit   History of Present Illness  Heather [name] is a 45 y.o. (06/15/1971) female  who presents for postoperative follow-up for: L 1st BRVT (Date: 08/31/16).  The patient's wounds are healed.  The patient notes no steal symptoms.  The patient is able to complete their activities of daily living.  The patient's current symptoms are: none.  For VQI Use Only  PRE-ADM LIVING: Home  AMB STATUS: Ambulatory  Physical Examination  Vitals:   11/12/16 0849  BP: (!) 162/90  Pulse: 98  Resp: 16  Temp: 97.6 F (36.4 C)  TempSrc: Oral  SpO2: 98%  Weight: 230 lb (104.3 kg)  Height: 5\' 9"  (1.753 m)    LUE: Incision is healed, skin feels warm, hand grip is 5/5, sensation in digits is intact, palpable thrill, bruit can  be auscultated, palpable radial pulse, Fistula 5-5.5 mm in proximal 2/3 upper arm, 5 mm in distal 1/3 upper arm   Medical Decision Making  Heather [name] is a 45 y.o. (1971-12-10) female who presents s/p L 1st BRVT.   Interval improvement in fistula size.  Recheck in one month, suspect will be ready for transposition at that time.  Will have pt follow up in one month for recheck on maturation  Thank you for allowing [name] to participate in this patient's care.  Adele Barthel, MD, FACS [facility] Office: [phone] Pager: [phone]

## 2016-11-12 ENCOUNTER — Ambulatory Visit (INDEPENDENT_AMBULATORY_CARE_PROVIDER_SITE_OTHER): Payer: Self-pay | Admitting: Vascular Surgery

## 2016-11-12 ENCOUNTER — Encounter: Payer: Self-pay | Admitting: Vascular Surgery

## 2016-11-12 VITALS — BP 162/90 | HR 98 | Temp 97.6°F | Resp 16 | Ht 69.0 in | Wt 230.0 lb

## 2016-11-12 DIAGNOSIS — N184 Chronic kidney disease, stage 4 (severe): Secondary | ICD-10-CM

## 2016-11-15 ENCOUNTER — Ambulatory Visit (INDEPENDENT_AMBULATORY_CARE_PROVIDER_SITE_OTHER): Payer: Managed Care, Other (non HMO) | Admitting: Internal Medicine

## 2016-11-15 ENCOUNTER — Ambulatory Visit (INDEPENDENT_AMBULATORY_CARE_PROVIDER_SITE_OTHER)
Admission: RE | Admit: 2016-11-15 | Discharge: 2016-11-15 | Disposition: A | Discharge status: Home / Self Care | Payer: Managed Care, Other (non HMO) | Source: Ambulatory Visit | Attending: Internal Medicine | Admitting: Internal Medicine

## 2016-11-15 ENCOUNTER — Encounter: Payer: Self-pay | Admitting: Internal Medicine

## 2016-11-15 VITALS — BP 154/80 | HR 102 | Ht 69.0 in | Wt 231.2 lb

## 2016-11-15 DIAGNOSIS — R058 Other specified cough: Secondary | ICD-10-CM

## 2016-11-15 DIAGNOSIS — R05 Cough: Secondary | ICD-10-CM | POA: Diagnosis not present

## 2016-11-15 DIAGNOSIS — I10 Essential (primary) hypertension: Secondary | ICD-10-CM

## 2016-11-15 DIAGNOSIS — J181 Lobar pneumonia, unspecified organism: Secondary | ICD-10-CM

## 2016-11-15 DIAGNOSIS — J189 Pneumonia, unspecified organism: Secondary | ICD-10-CM

## 2016-11-15 HISTORY — DX: Other specified cough: R05.8

## 2016-11-15 HISTORY — DX: Cough: R05

## 2016-11-15 NOTE — Progress Notes (Signed)
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 Wynetta Emery on 10/17.  Patient was scheduled to be discharged home on 10/17 but held as she desaturated to 80s on room air on ambulation. Patient is requiring home O2 (1 L/m continuously). Please reassess for further oxygen requirement during outpatient visit.  Admitted From: Home  Disposition:  Home  Recommendations for Outpatient Follow-up:  1. Follow up with PCP in 3-5 days for recheck. Completes antibiotics on 10/24.( total 8 days course) 2. Please follow up with nephrologist in 1 week  Discharge Condition: STABLE CODE STATUS: FULL Diet recommendation: Renal / Carb Modified   Brief/Interim Summary: HPI: Heather Haseman Scottis a 45 y.o.femalewith medical history significant for hypertension, insulin-dependent diabetes mellitus, chronic kidney disease stage IV undergoing left upper extremity AV fistula creation, now presenting to the emergency department with 2 days of progressive dyspnea, chills, productive cough, and headache. Patient reports that she had been in her usual state of health until approximately 2 days ago when she developed a general sense of malaise with a mild headache and sinus congestion. By that evening, patient was having difficulty catching her breath with minimal exertion and had developed a cough productive of thick yellow sputum. Patient denies any facial pain or earache, and denies sore throat. She denies chest pain or palpitations, but notes some new bilateral ankle edema. There is no orthopnea. Patient denies any recent long distance travel or sick contacts. She initially felt that she was developing a sinus infection, but those symptoms have begun to ease off  as her dyspnea, chills, malaise, and productive cough of all worsened. Just prior to arrival, the patient had become dyspneic while at rest and so she came into the ED for evaluation of this.  ED Course:Upon arrival to the ED, patient is found to be afebrile, saturating in the mid 80s on room air, tachycardic in the low 100s, and with vitals otherwise stable. EKG demonstrates a sinus rhythm with low voltage QRS and chest x-rays notable for airspace disease in the right upper lobe, right lower lobe, and left upper lobe consistent with pneumonia. Chemistry panel features a BUN of 38 and serum creatinine of 3.12 which appears to be consistent with her baseline. CBC features a stable normocytic anemia with hemoglobin 10.7 and a new leukocytosis to 16,400. Lactic acid is reassuring at 1.42. Blood cultures were obtained and the patient was treated with 3.5 L of normal saline, DuoNeb, supplemental oxygen, Rocephin, and Tylenol. Given her considerable comorbidity and new supplemental oxygen requirement, patient will be admitted to the telemetry unit for ongoing evaluation and management of apparent community-acquired pneumonia with acute hypoxic respiratory failure.  Hospital course: Heather Speir Scottis a 45 y.o.femalewith medical history significant for hypertension, insulin-dependent diabetes mellitus, chronic kidney disease stage IV undergoing left upper extremity AV fistula creation, now presenting to the emergency department with 2 days of progressive dyspnea, chills, productive cough, and headache.  Assessment & Plan: 1. CAP with acute hypoxic respiratory failure  - Presents with hypoxia, productive cough, rhonchi, leukocytosis, and CXR findings consistent with PNA  - Lactic acid reassuring at 1.42; there is no confusion or AKI  - Blood cultures are incubating, sputum culture and gram  stain requested; check urine for strep pneumo antigen  - Given 3.5 L NS in ED, will SLIV now and monitor fluid status   - Empiric Rocephin initiated in ED, will continue with the addition of azithromycin. Transitioned to oral doxycycline and will discharge with 7 more days.  Close follow up with PCP and nephrologist recommended.   2. CKD stage IV - Followed by Dr. Jimmy Footman of nephrology and preparing for HD - Undergoing LUE AVF placement with Dr. [name] of vascular, first phase on 08/31/16  - SCr is 3.12 on admission, and this appears to be her baseline  - She was given 3.5 L NS in ED; SLIV now and follow strict I/O's, resumed home lasix doses.     3. Insulin dependent DM  - A1c 9.0% in September 2017  - At home, she is managed with Tresiba 26 units at lunch, and Novolog 10 units with breakfast and dinner, and 15 units at lunch; will hold these here  - Check CBG with meals and qHS  - Resume home insulin regimen at discharge.  Follow BS closely and report to PCP.   4. Hypertension  - At goal currently  - Continue lisinopril   5. Anemia of chronic disease - Hgb 10.7 on admission, stable from priors and with no bleeding  - Secondary to CKD and managed with Aranesp   DVT prophylaxis:sq heparin Code Status:Full  Family Communication:Discussed with patient, husband at bedside Disposition Plan:Admit to telemetry Admission status:Inpatient   Discharge Diagnoses:  Principal Problem:   PNA (pneumonia) Active Problems:   DM (diabetes mellitus), type 2, uncontrolled, with renal complications ([place])   Diabetes mellitus, type II, insulin dependent ([place])   Essential hypertension   Anemia in chronic kidney disease   Chronic kidney disease, stage IV (severe) ([place])   Acute hypoxemic respiratory failure ([place])   Community acquired pneumonia   11/15/2016 1st [facility] office visit/ Alejo Beamer   Chief Complaint  Patient presents with  . PULMONARY CONSULT    Referred by Howard Pouch. Pt seen in [facility] for double PNA. Pt c/o residual coughing.    since  being on Lisinopril has had one other episode of  cough flare  attributed to the flu with persisent nasal/throat congestion that never completely resolved prior to acute illness with new infiltrates on cxr c/w pna rx as above and feels she has leveled off now in terms of response.   Still sob with exertion > baseline = MMRC1 = can walk nl pace, flat grade, can't hurry or go uphills or steps s sob  - throat congestion worst in am but even then no mucus production at all   No obvious day to day or daytime variability or assoc  Ongoing purulent sputum or mucus plugs or hemoptysis or cp or chest tightness, subjective wheeze or overt   hb symptoms. No unusual exp hx or h/o childhood pna/ asthma or knowledge of premature birth.  Sleeping ok without nocturnal  or early am exacerbation  of respiratory  c/o's or need for noct saba. Also denies any obvious fluctuation of symptoms with weather or environmental changes or other aggravating or alleviating factors except as outlined above   Current Medications, Allergies, Complete Past Medical History, Past Surgical History, Family History, and Social History were reviewed in [employer] record.     Review of Systems  Constitutional: Negative for chills, fever and unexpected weight change.  HENT: Negative for congestion, dental problem, ear pain, nosebleeds, postnasal drip, rhinorrhea,  sinus pressure, sneezing, sore throat, trouble swallowing and voice change.   Eyes: Negative for visual disturbance.  Respiratory: Positive for cough and shortness of breath. Negative for choking.   Cardiovascular: Negative for chest pain and leg swelling.  Gastrointestinal: Negative for abdominal pain, diarrhea and vomiting.  Genitourinary: Negative for difficulty urinating.  Musculoskeletal: Negative for arthralgias.  Skin: Negative for rash.  Neurological: Negative for tremors, syncope and headaches.  Hematological: Does not bruise/bleed easily.       Objective:   Physical Exam   amb bf nad    Wt Readings from Last 3 Encounters:  11/15/16 231 lb 3.2 oz (104.9 kg)  11/12/16 230 lb (104.3 kg)  10/26/16 239 lb (108.4 kg)    Vital signs reviewed - Note on arrival 02 sats  99% on RA       HEENT: nl dentition, turbinates, and oropharynx. Nl external ear canals without cough reflex   NECK :  without JVD/Nodes/TM/ nl carotid upstrokes bilaterally   LUNGS: no acc muscle use,  Nl contour chest which is clear to A and P bilaterally without cough on insp or exp maneuvers   CV:  RRR  no s3 or murmur or increase in P2, no edema   ABD:  soft and nontender with nl inspiratory excursion in the supine position. No bruits or organomegaly, bowel sounds nl  MS:  Nl gait/ ext warm without deformities, calf tenderness, cyanosis or clubbing No obvious joint restrictions   SKIN: warm and dry without lesions    NEURO:  alert, approp, nl sensorium with  no motor deficits     CXR PA and Lateral:   11/15/2016 :    I personally reviewed images and agree with radiology impression as follows:   Improvement in aeration in right upper lobe without definite residual infiltrate. No new infiltrate or pulmonary edema.     Assessment & Plan:

## 2016-11-15 NOTE — Patient Instructions (Addendum)
I will send Dr Deterding a copy of this note and recommend a trial off of the lisinopril due to your persistent sense of throat congestion  Please remember to go to the x-ray department downstairs for your tests - we will call you with the results when they are available.  GERD (REFLUX)  Which is made worse by coughing is an extremely common cause of respiratory symptoms just like yours , many times with no obvious heartburn at all.    It can be treated with medication, but also with lifestyle changes including elevation of the head of your bed (ideally with 6 inch  bed blocks),  Smoking cessation, avoidance of late meals, excessive alcohol, and avoid fatty foods, chocolate, peppermint, colas, red wine, and acidic juices such as orange juice.  NO MINT OR MENTHOL PRODUCTS SO NO COUGH DROPS  USE SUGARLESS CANDY INSTEAD (Jolley ranchers or Stover's or Life Savers) or even ice chips will also do - the key is to swallow to prevent all throat clearing. NO OIL BASED VITAMINS - use powdered substitutes.   Try add pepcid ac 20 mg at bedtime to see if helps the am cough and if so continue it until the cough is gone at least a week

## 2016-11-16 NOTE — Assessment & Plan Note (Addendum)
Upper airway cough syndrome (previously labeled PNDS) , is  so named because it's frequently impossible to sort out how much is  CR/sinusitis with freq throat clearing (which can be related to primary GERD)   vs  causing  secondary (" extra esophageal")  GERD from wide swings in gastric pressure that occur with throat clearing, often  promoting self use of mint and menthol lozenges that reduce the lower esophageal sphincter tone and exacerbate the problem further in a cyclical fashion.   These are the same pts (now being labeled as having "irritable larynx syndrome" by some cough centers) who not infrequently have a history of having failed to tolerate ace inhibitors,  dry powder inhalers or biphosphonates or report having atypical/extraesophageal reflux symptoms that don't respond to standard doses of PPI  and are easily confused as having aecopd or asthma flares by even experienced allergists/ pulmonologists (myself included).      For now all she needs to add is trial off acei (see below) gerd diet/lifestyle  and pepcid at hs to see if helps am throat congestion and if not better then next step is sinus CT    Total time devoted to counseling  = 35/14m review case with pt/ discussion of options/alternatives/ personally creating written instructions  in presence of pt  then going over those specific  Instructions directly with the pt including how to use all of the meds but in particular covering each new medication in detail and the difference between the maintenance/automatic meds and the prns using an action plan format for the latter.

## 2016-11-16 NOTE — Assessment & Plan Note (Signed)
cxr 11/15/2016 resolved,  No further w/u indicated

## 2016-11-16 NOTE — Assessment & Plan Note (Signed)
Body mass index is 34.14   Lab Results  Component Value Date   TSH 0.71 04/17/2015     Contributing to gerd tendency/ doe/reviewed the need and the process to achieve and maintain neg calorie balance > defer f/u primary care including intermittently monitoring thyroid status

## 2016-11-16 NOTE — Assessment & Plan Note (Signed)
In the best review of chronic cough to date ( NEJM 2016 375 [phone]) ,  ACEi are now felt to cause cough in up to  20% of pts which is a 4 fold increase from previous reports and does not include the variety of non-specific complaints we see in pulmonary clinic in pts on ACEi but previously attributed to another dx like  Copd/asthma and  include PNDS, throat and chest congestion, "bronchitis", unexplained dyspnea and noct "strangling" sensations, and hoarseness, but also  atypical /refractory GERD symptoms like dysphagia and "bad heartburn"   The only way I know  to prove this is not an "ACEi Case" is a trial off ACEi x a minimum of 6 weeks then regroup.   Since has CRI and under careful f/u by Dr Jimmy Footman, will send him a direct fax of this note and ask him to try her off acei x 6 weeks and then f/u here if throat congestion has not improved.

## 2016-11-19 ENCOUNTER — Inpatient Hospital Stay (HOSPITAL_COMMUNITY): Admission: RE | Admit: 2016-11-19 | Payer: Managed Care, Other (non HMO) | Source: Ambulatory Visit

## 2016-11-25 ENCOUNTER — Ambulatory Visit (INDEPENDENT_AMBULATORY_CARE_PROVIDER_SITE_OTHER): Payer: Managed Care, Other (non HMO) | Admitting: Family Medicine

## 2016-11-25 ENCOUNTER — Encounter: Payer: Self-pay | Admitting: Family Medicine

## 2016-11-25 ENCOUNTER — Other Ambulatory Visit (INDEPENDENT_AMBULATORY_CARE_PROVIDER_SITE_OTHER): Payer: Managed Care, Other (non HMO)

## 2016-11-25 VITALS — BP 190/109 | HR 98 | Temp 98.2°F | Resp 20 | Wt 231.8 lb

## 2016-11-25 DIAGNOSIS — K3184 Gastroparesis: Secondary | ICD-10-CM

## 2016-11-25 DIAGNOSIS — E1122 Type 2 diabetes mellitus with diabetic chronic kidney disease: Secondary | ICD-10-CM

## 2016-11-25 DIAGNOSIS — E119 Type 2 diabetes mellitus without complications: Secondary | ICD-10-CM

## 2016-11-25 DIAGNOSIS — R1013 Epigastric pain: Secondary | ICD-10-CM | POA: Diagnosis not present

## 2016-11-25 DIAGNOSIS — Z794 Long term (current) use of insulin: Secondary | ICD-10-CM

## 2016-11-25 DIAGNOSIS — T25221A Burn of second degree of right foot, initial encounter: Secondary | ICD-10-CM | POA: Diagnosis not present

## 2016-11-25 DIAGNOSIS — IMO0002 Reserved for concepts with insufficient information to code with codable children: Secondary | ICD-10-CM

## 2016-11-25 DIAGNOSIS — E1165 Type 2 diabetes mellitus with hyperglycemia: Secondary | ICD-10-CM

## 2016-11-25 DIAGNOSIS — K5904 Chronic idiopathic constipation: Secondary | ICD-10-CM

## 2016-11-25 DIAGNOSIS — N184 Chronic kidney disease, stage 4 (severe): Secondary | ICD-10-CM

## 2016-11-25 LAB — COMPREHENSIVE METABOLIC PANEL
ALK PHOS: 69 U/L (ref 39–117)
ALT: 21 U/L (ref 0–35)
AST: 17 U/L (ref 0–37)
Albumin: 3.3 g/dL — ABNORMAL LOW (ref 3.5–5.2)
BILIRUBIN TOTAL: 0.3 mg/dL (ref 0.2–1.2)
BUN: 49 mg/dL — ABNORMAL HIGH (ref 6–23)
CALCIUM: 9 mg/dL (ref 8.4–10.5)
CO2: 22 meq/L (ref 19–32)
Chloride: 102 mEq/L (ref 96–112)
Creatinine, Ser: 3.44 mg/dL — ABNORMAL HIGH (ref 0.40–1.20)
GFR: 18.37 mL/min — AB (ref >60.00)
GLUCOSE: 388 mg/dL — AB (ref 70–99)
POTASSIUM: 4.6 meq/L (ref 3.5–5.1)
Sodium: 132 mEq/L — ABNORMAL LOW (ref 135–145)
Total Protein: 7.1 g/dL (ref 6.0–8.3)

## 2016-11-25 LAB — CBC WITH DIFFERENTIAL/PLATELET
BASOS ABS: 0 10*3/uL (ref 0.0–0.1)
BASOS PCT: 0.3 % (ref 0.0–3.0)
EOS ABS: 0.1 10*3/uL (ref 0.0–0.7)
Eosinophils Relative: 1.2 % (ref 0.0–5.0)
HEMATOCRIT: 36.5 % (ref 36.0–46.0)
HEMOGLOBIN: 11.7 g/dL — AB (ref 12.0–15.0)
LYMPHS PCT: 27.8 % (ref 12.0–46.0)
Lymphs Abs: 1.6 10*3/uL (ref 0.7–4.0)
MCHC: 32.1 g/dL (ref 30.0–36.0)
MCV: 89.1 fl (ref 78.0–100.0)
MONOS PCT: 5 % (ref 3.0–12.0)
Monocytes Absolute: 0.3 10*3/uL (ref 0.1–1.0)
NEUTROS ABS: 3.7 10*3/uL (ref 1.4–7.7)
Neutrophils Relative %: 65.7 % (ref 43.0–77.0)
Platelets: 272 10*3/uL (ref 150.0–400.0)
RBC: 4.09 Mil/uL (ref 3.87–5.11)
RDW: 14.7 % (ref 11.5–15.5)
WBC: 5.6 10*3/uL (ref 4.0–10.5)

## 2016-11-25 LAB — POC URINALSYSI DIPSTICK (AUTOMATED)
BILIRUBIN UA: NEGATIVE
Glucose, UA: 500
KETONES UA: NEGATIVE
LEUKOCYTES UA: NEGATIVE
Nitrite, UA: NEGATIVE
Protein, UA: >=300
Spec Grav, UA: 1.02
Urobilinogen, UA: 0.2
pH, UA: 5.5

## 2016-11-25 LAB — LIPASE: Lipase: 56 U/L (ref 11.0–59.0)

## 2016-11-25 LAB — GLUCOSE, POCT (MANUAL RESULT ENTRY): POC GLUCOSE: 429 mg/dL — AB (ref 70–99)

## 2016-11-25 LAB — PHOSPHORUS: PHOSPHORUS: 3.9 mg/dL (ref 2.3–4.6)

## 2016-11-25 MED ORDER — SENNOSIDES-DOCUSATE SODIUM 8.6-50 MG PO TABS: 2.0000 | ORAL_TABLET | Freq: Every day | ORAL | 5 refills | Status: DC

## 2016-11-25 MED ORDER — BISACODYL 10 MG RE SUPP: RECTAL | 0 refills | Status: DC

## 2016-11-25 MED ORDER — METOCLOPRAMIDE HCL 5 MG PO TABS: 5.0000 mg | ORAL_TABLET | Freq: Two times a day (BID) | ORAL | 0 refills | Status: DC

## 2016-11-25 MED ORDER — LINACLOTIDE 290 MCG PO CAPS: 290.0000 ug | ORAL_CAPSULE | Freq: Every day | ORAL | 11 refills | Status: DC

## 2016-11-25 NOTE — Progress Notes (Signed)
Heather [name] , 13-Aug-1971, 45 y.o., female MRN: [id number] Patient Care Team    Relationship Specialty Notifications Start End  Ma Hillock, DO PCP - General Family Medicine  09/03/15   Gerarda Fraction, MD Referring Physician Ophthalmology  05/12/16   Mauricia Area, MD Consulting Physician Nephrology  08/27/16   Luvenia Starch, MD Referring Physician Internal Medicine  10/15/16     CC: epigastric pain  Subjective: Pt presents for an acute OV with complaints of epigastric pain and nausea of 2 weeks duration.  Associated symptoms include constipation and NBNB vomit x1. Pt states her epigastric pain is a burning pain that is constant, but worse with food. Any type of food and amount with cause her more discomfort. She denies fever or chills. She has not taken any of her medications today. She has a history of constipation. She does not take anything for her chronic constipation despite encouragement to do so. She reports she avergaes a BM about once a week only, her last BM was yesterday. She reports it was her normal, nonbloody. She has a h/o gastroparesis in 2012, and uncontrolled diabetes with noncompliance and CKD4.   Burn: Recent states she burned her right foot on a  movable heater, she had placed under her desk. She has severe neuropathy, stage IV chronic kidney disease and severely uncontrolled diabetes. He denies fevers, chills, erythema. She endorses a large blister located on the side of her right foot.  Allergies  Allergen Reactions  . Ibuprofen Other (See Comments)    CKD stage 3. Should avoid.  . Dilaudid [Hydromorphone Hcl] Itching and Other (See Comments)    Can take with benadryl   Social History  Substance Use Topics  . Smoking status: Never Smoker  . Smokeless tobacco: Never Used  . Alcohol use No   Past Medical History:  Diagnosis Date  . Allergy   . Anemia    chronic disease  . Blood transfusion without reported diagnosis   . Chronic constipation   . Chronic  kidney disease   . Diabetes mellitus    Type II  . Fibroids    leiomyoma 8/04 per [name]  . Fibromyalgia   . Gastroparesis    DM uncontrolled.   . Headache   . Hyperlipemia   . Hypertension   . IBS (irritable bowel syndrome)   . Infertility    eval at [facility]  . Obesity   . Pancreatitis    Past Surgical History:  Procedure Laterality Date  . BASCILIC VEIN TRANSPOSITION Left 08/31/2016   Procedure: BASILIC VEIN TRANSPOSITION FIRST STAGE;  Surgeon: Conrad Scotia, MD;  Location: [facility];  Service: Vascular;  Laterality: Left;  . EYE SURGERY     bilateral, vitrectomies  . fibroid tumors removed    . FOOT SURGERY     bilateral   Family History  Problem Relation Age of Onset  . Colon polyps Mother   . Diabetes Mother   . Heart murmur Father   . Hypertension Father   . Diabetes Sister   . Kidney failure Sister   . Diabetes Brother   . Retinal degeneration Brother   . Heart murmur Son   . Stroke Paternal Grandmother   . Diabetes Sister      Medication List       Accurate as of 11/25/16 10:17 AM. Always use your most recent med list.          calcitRIOL 0.25 MCG capsule Commonly known as:  ROCALTROL Take 0.5 mcg by mouth at bedtime.   cyclobenzaprine 10 MG tablet Commonly known as:  FLEXERIL Take 1 tablet (10 mg total) by mouth 2 (two) times daily as needed for muscle spasms.   epoetin alfa 10000 UNIT/ML injection Commonly known as:  EPOGEN,PROCRIT Inject 30,000 Units into the vein every 14 (fourteen) days.   furosemide 80 MG tablet Commonly known as:  LASIX Take 1 tablet (80 mg total) by mouth 2 (two) times daily.   gabapentin 300 MG capsule Commonly known as:  NEURONTIN Take 1 capsule (300 mg total) by mouth 2 (two) times daily as needed.   glucose blood test strip Commonly known as:  ONETOUCH VERIO Test 4 times daily.   insulin aspart 100 UNIT/ML FlexPen Commonly known as:  NOVOLOG Inject 10-15 Units into the skin See admin instructions. Inject 10 units  subque in the morning and inject 15 units subqu at lunch and dinner   lisinopril 40 MG tablet Commonly known as:  PRINIVIL,ZESTRIL Take 1 tablet (40 mg total) by mouth daily.   oxyCODONE-acetaminophen 5-325 MG tablet Commonly known as:  PERCOCET/ROXICET Take 1 tablet by mouth every 6 (six) hours as needed for severe pain.   TRESIBA FLEXTOUCH 100 UNIT/ML Sopn FlexTouch Pen Generic drug:  insulin degludec Inject 26 Units into the skin daily with lunch.       Results for orders placed or performed in visit on 11/25/16 (from the past 24 hour(s))  POCT glucose (manual entry)     Status: Abnormal   Collection Time: 11/25/16 10:14 AM  Result Value Ref Range   POC Glucose 429 (A) 70 - 99 mg/dl   No results found.   ROS: Negative, with the exception of above mentioned in HPI   Objective:  BP (!) 190/109 (BP Location: Right Arm, Patient Position: Sitting, Cuff Size: Normal)   Pulse 98   Temp 98.2 F (36.8 C)   Resp 20   Wt 231 lb 12 oz (105.1 kg)   LMP 11/12/2016   SpO2 99%   BMI 34.22 kg/m  Body mass index is 34.22 kg/m. Gen: Afebrile. No acute distress. Obese. Lying on exam table, appears fatigued.  HENT: AT. [place]. Tacky mucous membranes.  Eyes:Pupils Equal Round Reactive to light, Extraocular movements intact,  Conjunctiva without redness, discharge or icterus. Neck/lymp/endocrine: Supple, no lymphadenopathy CV: RRR, no edema Chest: CTAB, no wheeze or crackles. Good air movement, normal resp effort.  Abd: Soft. Obese. Moderately distended. Tender to palpation above umbilicus. Bowel sounds present. Moderate to large stool burden palpated. No rebound or guarding.  Skin: ~3 cm diameter blister formation right lateral foot. No erythema, skin intact. No rashes, purpura or petechiae.  Neuro: Normal gait. PERLA. EOMi. Alert. Oriented x3. No asterixis.   Results for orders placed or performed in visit on 11/25/16 (from the past 72 hour(s))  POCT glucose (manual entry)     Status:  Abnormal   Collection Time: 11/25/16 10:14 AM  Result Value Ref Range   POC Glucose 429 (A) 70 - 99 mg/dl  POCT Urinalysis Dipstick (Automated)     Status: None   Collection Time: 11/25/16 10:58 AM  Result Value Ref Range   Color, UA yellow    Clarity, UA clear    Glucose, UA 500    Bilirubin, UA negative    Ketones, UA negative    Spec Grav, UA 1.020    Blood, UA small    pH, UA 5.5    Protein, UA >=300  Urobilinogen, UA 0.2    Nitrite, UA negative    Leukocytes, UA Negative Negative    Assessment/Plan: ADALYNN CORNE is a 45 y.o. female present for acute OV for  Diabetes mellitus, type II, insulin dependent (HCC)/Uncontrolled type 2 diabetes mellitus with stage 4 chronic kidney disease, with long-term current use of insulin (HCC) Hypertension - Uncontrolled - Discussed with patient the importance of taking her medication as prescribed. Her glucose was elevated at 429 at the office today. Patient has not taking any of her insulin or medications today. Patient was encouraged to go home and take her insulin. If her blood sugars do not respond to her medication and return to her baseline, then she is to call her endocrinologist and or report to the emergency room. - POCT glucose (manual entry) - POCT Urinalysis Dipstick (Automated) - NM Gastric Emptying; Future - AMB referral to wound care center - CBC with Differential/Platelet; Future - Comprehensive metabolic panel; Future Elevated BP: Patient has a history of hypertension she has not taken any of her medications today. Advised her strongly to take her medications when she gets home. If her blood pressure does not respond and return to her baseline she is to go to the emergency room to seek treatment.  Epigastric pain/Chronic idiopathic constipation/Gastroparesis - New - Discussed in great detail with patient her symptoms are likely multifactorial. He is known to have chronic constipation, uncontrolled diabetes and  gastroparesis. Her gastric emptying study in 2012 was positive for mild gastroparesis. I suspect this is worsening with her uncontrolled diabetes. I discussed the use of Reglan short-term/5 days/renally adjusted dose for a trial period. I have also ordered a repeat gastric emptying study, that she is to have completed within the next few weeks when not taking the Reglan. [name] to start a daily bowel regimen. I discussed with her in great detail today this needs to be every day, likely for the rest of her life given her history. He was encouraged to be more compliant with all medications in order to decrease her symptom relapses. Senokot-D was prescribed 2 tablets daily, Dulcolax suppositories were prescribed to use 1 time daily when necessary, reordered Linzess. Patient was prescribed this from her gastroenterologist but does not ever recall actually using it. Discussed with patient daily use of these medications can prevent her constipation. [name] to restart the omeprazole 40 mg daily. - POCT Urinalysis Dipstick (Automated): No signs of infection. - CBC with Differential/Platelet; Future - Lipase; Future - Comprehensive metabolic panel; Future - Phosphorus; Future - linaclotide (LINZESS) 290 MCG CAPS capsule; Take 1 capsule (290 mcg total) by mouth daily before breakfast.  Dispense: 30 capsule; Refill: 11 - senna-docusate (SENOKOT-S) 8.6-50 MG tablet; Take 2 tablets by mouth daily.  Dispense: 60 tablet; Refill: 5 - bisacodyl (DULCOLAX) 10 MG suppository; Use 1 suppository once daily until bowel movement.  Dispense: 5 suppository; Refill: 0 - NM Gastric Emptying; Future - metoCLOPramide (REGLAN) 5 MG tablet; Take 1 tablet (5 mg total) by mouth 2 (two) times daily. 1 tab BID for 5 days  Dispense: 10 tablet; Refill: 0  Partial thickness burn of right foot, initial encounter - New - Discussed with patient the importance of seeking immediate care with wound clinic. If in her history of severe neuropathy,  severely uncontrolled diabetes. She does not have proper care until the wound is completely healed she would easily be prone to severe infection. Patient was encouraged to keep area clean, dry and use Silvadene cream over  area. She was provided with a walking shoe today and discouraged from using any type of shoe at home that would rub on this location. [name] to leave the blister intact. - AMB referral to wound care center - CBC with Differential/Platelet; Future   Greater than 40 minutes spent with patient, >50% of time spent face to face    electronically signed by:  Howard Pouch, DO  [place]

## 2016-11-25 NOTE — Patient Instructions (Signed)
I have ordered a gastric empty study for you, this will need to be done a week or two after the Reglan is finished. The reglan is taken every 12 hours for 5 days.    You are to start Senna-D daily, and I will call in the lInzess --> all for constipation, taken every day.  Restart omeprazole 40 mg every day, this is called in for you . 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   You need to only take 300mg  of gabapentin two times a day, no more. Your kidneys can not take a higher dose.   You should be receiving a call from wound care for your foot, make sure to go to this appt. You can pick up silvadene creme at drug store to put over area. Do not bust the blister. Keep clean, dry and covered. Try not to wear a shoe over this area.   When you get home [place]!!! Your blood pressure is high today and so is your sugar. If they do not respond to your medicines , then you will need to be seen in the ED .     Gastroparesis Introduction Gastroparesis, also called delayed gastric emptying, is a condition in which food takes longer than normal to empty from the stomach. The condition is usually long-lasting (chronic). What are the causes? This condition may be caused by:  An endocrine disorder, such as hypothyroidism or diabetes. Diabetes is the most common cause of this condition.  A nervous system disease, such as Parkinson disease or multiple sclerosis.  Cancer, infection, or surgery of the stomach or vagus nerve.  A connective tissue disorder, such as scleroderma.  Certain medicines. In most cases, the cause is not known. What increases the risk? This condition is more likely to develop in:  People with certain disorders, including endocrine disorders, eating disorders, amyloidosis, and scleroderma.  People with certain diseases, including Parkinson disease or multiple sclerosis.  People with cancer or infection of the stomach or vagus nerve.  People who have had surgery on  the stomach or vagus nerve.  People who take certain medicines.  Women. What are the signs or symptoms? Symptoms of this condition include:  An early feeling of fullness when eating.  Nausea.  Weight loss.  Vomiting.  Heartburn.  Abdominal bloating.  Inconsistent blood glucose levels.  Lack of appetite.  Acid from the stomach coming up into the esophagus (gastroesophageal reflux).  Spasms of the stomach. Symptoms may come and go. How is this diagnosed? This condition is diagnosed with tests, such as:  Tests that check how long it takes food to move through the stomach and intestines. These tests include:  Upper gastrointestinal (GI) series. In this test, X-rays of the intestines are taken after you drink a liquid. The liquid makes the intestines show up better on the X-rays.  Gastric emptying scintigraphy. In this test, scans are taken after you eat food that contains a small amount of radioactive material.  Wireless capsule GI monitoring system. This test involves swallowing a capsule that records information about movement through the stomach.  Gastric manometry. This test measures electrical and muscular activity in the stomach. It is done with a thin tube that is passed down the throat and into the stomach.  Endoscopy. This test checks for abnormalities in the lining of the stomach. It is done with a long, thin tube that is passed down the throat and into the stomach.  An ultrasound. This test can help  rule out gallbladder disease or pancreatitis as a cause of your symptoms. It uses sound waves to take pictures of the inside of your body. How is this treated? There is no cure for gastroparesis. This condition may be managed with:  Treatment of the underlying condition causing the gastroparesis.  Lifestyle changes, including exercise and dietary changes. Dietary changes can include:  Changes in what and when you eat.  Eating smaller meals more often.  Eating  low-fat foods.  Eating low-fiber forms of high-fiber foods, such as cooked vegetables instead of raw vegetables.  Having liquid foods in place of solid foods. Liquid foods are easier to digest.  Medicines. These may be given to control nausea and vomiting and to stimulate stomach muscles.  Getting food through a feeding tube. This may be done in severe cases.  A gastric neurostimulator. This is a device that is inserted into the body with surgery. It helps improve stomach emptying and control nausea and vomiting. Follow these instructions at home:  Follow your health care provider's instructions about exercise and diet.  Take medicines only as directed by your health care provider. Contact a health care provider if:  Your symptoms do not improve with treatment.  You have new symptoms. Get help right away if:  You have severe abdominal pain that does not improve with treatment.  You have nausea that does not go away.  You cannot keep fluids down. This information is not intended to replace advice given to you by your health care provider. Make sure you discuss any questions you have with your health care provider. Document Released: 12/13/2005 Document Revised: 05/20/2016 Document Reviewed: 12/09/2014  2017 Elsevier

## 2016-11-26 ENCOUNTER — Telehealth: Payer: Self-pay | Admitting: Family Medicine

## 2016-11-26 MED ORDER — OMEPRAZOLE 40 MG PO CPDR: 40.0000 mg | DELAYED_RELEASE_CAPSULE | Freq: Every day | ORAL | 3 refills | Status: DC

## 2016-11-26 NOTE — Telephone Encounter (Signed)
Please call pt' - her labs are consistent with prior labs for her, with the exception of mild signs of dehydration (probably from not eating or drinking much bc of her nausea). This is also making her kidney function slightly decreased from her prior levels, which are low anyway. She needs to maintain adequate hydration. Hopefully the medicines I prescribed yesterday will help her with this.  - I would like to see her back next week to check her labs again, see if the reglan helped and double check on her foot wound.  - I fher condition worsens she should be seen immediately and if her sugars remain elevated after her insulin she needs to call her endocrinologist and/or go to ED for treatment.

## 2016-11-26 NOTE — Telephone Encounter (Signed)
Spoke with patient reviewed lab results and instructions. Transferred call to Diane to schedule  Patient appt.

## 2016-12-01 ENCOUNTER — Encounter (HOSPITAL_COMMUNITY): Payer: Self-pay

## 2016-12-01 ENCOUNTER — Ambulatory Visit (HOSPITAL_COMMUNITY)
Admission: RE | Admit: 2016-12-01 | Discharge: 2016-12-01 | Disposition: A | Discharge status: Home / Self Care | Payer: Managed Care, Other (non HMO) | Source: Ambulatory Visit | Attending: Family Medicine | Admitting: Family Medicine

## 2016-12-01 ENCOUNTER — Encounter: Payer: Self-pay | Admitting: Vascular Surgery

## 2016-12-01 ENCOUNTER — Telehealth: Payer: Self-pay | Admitting: Family Medicine

## 2016-12-01 DIAGNOSIS — IMO0002 Reserved for concepts with insufficient information to code with codable children: Secondary | ICD-10-CM

## 2016-12-01 DIAGNOSIS — N184 Chronic kidney disease, stage 4 (severe): Secondary | ICD-10-CM

## 2016-12-01 DIAGNOSIS — E119 Type 2 diabetes mellitus without complications: Secondary | ICD-10-CM

## 2016-12-01 DIAGNOSIS — E1122 Type 2 diabetes mellitus with diabetic chronic kidney disease: Secondary | ICD-10-CM

## 2016-12-01 DIAGNOSIS — E1165 Type 2 diabetes mellitus with hyperglycemia: Secondary | ICD-10-CM

## 2016-12-01 DIAGNOSIS — Z794 Long term (current) use of insulin: Principal | ICD-10-CM

## 2016-12-01 DIAGNOSIS — K3184 Gastroparesis: Secondary | ICD-10-CM

## 2016-12-01 NOTE — Telephone Encounter (Signed)
Heather [name] with [facility] calling to report patient was scheduled for gastric emptying this morning. However, since patient forgot to stop taking her metoCLOPramide (REGLAN) 5 MG tablet for 2 days, the appointment had to be cancelled.   Patient was advised to reschedule appointment.

## 2016-12-01 NOTE — Telephone Encounter (Signed)
Dr Raoul Pitch aware.

## 2016-12-02 ENCOUNTER — Ambulatory Visit: Payer: Managed Care, Other (non HMO) | Admitting: Family Medicine

## 2016-12-06 NOTE — Progress Notes (Deleted)
    Postoperative Access Visit   History of Present Illness  Heather [name] is a 45 y.o. 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  The patient's wounds are *** healed.  The patient notes *** steal symptoms.  The patient is *** able to complete their activities of daily living.  The patient's current symptoms are: ***.  For VQI Use Only  PRE-ADM LIVING: {VQI Pre-admission Living:20973}  AMB STATUS: {VQI Ambulatory Status:20974}  Physical Examination There were no vitals filed for this visit.  LUE: Incision is *** healed, skin feels ***, hand grip is ***/5, sensation in digits is *** intact, ***palpable thrill, bruit can *** be auscultated   Medical Decision Making  Heather [name] is a 45 y.o. year old female who presents s/p L 1st BRVT.   The patient's access is *** ready for use.  The patient's tunneled dialysis catheter can be removed after two successful cannulations and completed dialysis treatments.  Thank you for allowing [name] to participate in this patient's care.  Adele Barthel, MD, FACS [facility] Office: [phone] Pager: [phone]

## 2016-12-09 ENCOUNTER — Ambulatory Visit: Payer: Managed Care, Other (non HMO) | Admitting: Vascular Surgery

## 2016-12-10 ENCOUNTER — Encounter (HOSPITAL_BASED_OUTPATIENT_CLINIC_OR_DEPARTMENT_OTHER): Payer: Managed Care, Other (non HMO) | Attending: Internal Medicine

## 2016-12-10 DIAGNOSIS — Y990 Civilian activity done for income or pay: Secondary | ICD-10-CM | POA: Insufficient documentation

## 2016-12-10 DIAGNOSIS — T25221A Burn of second degree of right foot, initial encounter: Secondary | ICD-10-CM | POA: Insufficient documentation

## 2016-12-10 DIAGNOSIS — X16XXXA Contact with hot heating appliances, radiators and pipes, initial encounter: Secondary | ICD-10-CM | POA: Insufficient documentation

## 2016-12-10 DIAGNOSIS — I10 Essential (primary) hypertension: Secondary | ICD-10-CM | POA: Diagnosis not present

## 2016-12-10 DIAGNOSIS — E1140 Type 2 diabetes mellitus with diabetic neuropathy, unspecified: Secondary | ICD-10-CM | POA: Diagnosis not present

## 2016-12-10 DIAGNOSIS — E11621 Type 2 diabetes mellitus with foot ulcer: Secondary | ICD-10-CM | POA: Diagnosis not present

## 2016-12-10 DIAGNOSIS — L97511 Non-pressure chronic ulcer of other part of right foot limited to breakdown of skin: Secondary | ICD-10-CM | POA: Diagnosis not present

## 2016-12-15 ENCOUNTER — Encounter (HOSPITAL_COMMUNITY): Payer: Self-pay

## 2016-12-15 ENCOUNTER — Encounter (HOSPITAL_COMMUNITY)
Admission: RE | Admit: 2016-12-15 | Discharge: 2016-12-15 | Disposition: A | Discharge status: Home / Self Care | Payer: Managed Care, Other (non HMO) | Source: Ambulatory Visit | Attending: Family Medicine | Admitting: Family Medicine

## 2016-12-15 ENCOUNTER — Telehealth: Payer: Self-pay | Admitting: Family Medicine

## 2016-12-15 ENCOUNTER — Other Ambulatory Visit: Payer: Self-pay | Admitting: Family Medicine

## 2016-12-15 DIAGNOSIS — R1084 Generalized abdominal pain: Secondary | ICD-10-CM

## 2016-12-15 DIAGNOSIS — I10 Essential (primary) hypertension: Secondary | ICD-10-CM

## 2016-12-15 MED ORDER — TECHNETIUM TC 99M SULFUR COLLOID
2.0000 | Freq: Once | INTRAVENOUS | Status: AC | PRN
  Administered 2016-12-15: 2 via INTRAVENOUS

## 2016-12-15 NOTE — Telephone Encounter (Signed)
Please call patient: I received the results of her gastric emptying study, and need to review these with her including options of therapy. As well as discuss her response to the medications we had had given her a trial of prior to study. These have her make an appointment as soon as possible so that we may start treatment for her.

## 2016-12-16 DIAGNOSIS — T25221A Burn of second degree of right foot, initial encounter: Secondary | ICD-10-CM | POA: Diagnosis not present

## 2016-12-16 NOTE — Telephone Encounter (Signed)
Left message on patient voice mail to call and schedule an appt to review test results and treatment options.

## 2016-12-22 ENCOUNTER — Encounter (HOSPITAL_COMMUNITY)
Admission: RE | Admit: 2016-12-22 | Discharge: 2016-12-22 | Disposition: A | Discharge status: Home / Self Care | Payer: Managed Care, Other (non HMO) | Source: Ambulatory Visit | Attending: Nephrology | Admitting: Nephrology

## 2016-12-22 DIAGNOSIS — N182 Chronic kidney disease, stage 2 (mild): Secondary | ICD-10-CM | POA: Insufficient documentation

## 2016-12-22 LAB — POCT HEMOGLOBIN-HEMACUE: HEMOGLOBIN: 9.5 g/dL — AB (ref 12.0–15.0)

## 2016-12-22 MED ORDER — EPOETIN ALFA 40000 UNIT/ML IJ SOLN
40000.0000 [IU] | INTRAMUSCULAR | Status: DC
  Administered 2016-12-22: 40000 [IU] via SUBCUTANEOUS

## 2016-12-22 MED ORDER — EPOETIN ALFA 40000 UNIT/ML IJ SOLN
INTRAMUSCULAR | Status: AC
  Administered 2016-12-22: 40000 [IU] via SUBCUTANEOUS
  Filled 2016-12-22: qty 1

## 2016-12-24 ENCOUNTER — Ambulatory Visit (INDEPENDENT_AMBULATORY_CARE_PROVIDER_SITE_OTHER): Payer: Managed Care, Other (non HMO) | Admitting: Family Medicine

## 2016-12-24 ENCOUNTER — Encounter: Payer: Self-pay | Admitting: Family Medicine

## 2016-12-24 VITALS — BP 162/83 | HR 96 | Temp 97.8°F | Resp 20 | Ht 69.0 in | Wt 225.2 lb

## 2016-12-24 DIAGNOSIS — N184 Chronic kidney disease, stage 4 (severe): Secondary | ICD-10-CM | POA: Diagnosis not present

## 2016-12-24 DIAGNOSIS — K3184 Gastroparesis: Secondary | ICD-10-CM | POA: Diagnosis not present

## 2016-12-24 DIAGNOSIS — Z23 Encounter for immunization: Secondary | ICD-10-CM | POA: Diagnosis not present

## 2016-12-24 DIAGNOSIS — T25221A Burn of second degree of right foot, initial encounter: Secondary | ICD-10-CM | POA: Insufficient documentation

## 2016-12-24 DIAGNOSIS — T25221D Burn of second degree of right foot, subsequent encounter: Secondary | ICD-10-CM

## 2016-12-24 DIAGNOSIS — R1013 Epigastric pain: Secondary | ICD-10-CM

## 2016-12-24 MED ORDER — METOCLOPRAMIDE HCL 5 MG PO TABS: 5.0000 mg | ORAL_TABLET | Freq: Two times a day (BID) | ORAL | 0 refills | Status: DC

## 2016-12-24 NOTE — Patient Instructions (Signed)
It was great to see you.  Keep up the wound care on the foot wound.  Add reglan scheduled for 3 months, order placed. If doing well, do not need to follow up on this issue.  Continue regular schedule visits here and with specialist.    Gastroparesis Introduction Gastroparesis, also called delayed gastric emptying, is a condition in which food takes longer than normal to empty from the stomach. The condition is usually long-lasting (chronic). What are the causes? This condition may be caused by:  An endocrine disorder, such as hypothyroidism or diabetes. Diabetes is the most common cause of this condition.  A nervous system disease, such as Parkinson disease or multiple sclerosis.  Cancer, infection, or surgery of the stomach or vagus nerve.  A connective tissue disorder, such as scleroderma.  Certain medicines. In most cases, the cause is not known. What increases the risk? This condition is more likely to develop in:  People with certain disorders, including endocrine disorders, eating disorders, amyloidosis, and scleroderma.  People with certain diseases, including Parkinson disease or multiple sclerosis.  People with cancer or infection of the stomach or vagus nerve.  People who have had surgery on the stomach or vagus nerve.  People who take certain medicines.  Women. What are the signs or symptoms? Symptoms of this condition include:  An early feeling of fullness when eating.  Nausea.  Weight loss.  Vomiting.  Heartburn.  Abdominal bloating.  Inconsistent blood glucose levels.  Lack of appetite.  Acid from the stomach coming up into the esophagus (gastroesophageal reflux).  Spasms of the stomach. Symptoms may come and go. How is this diagnosed? This condition is diagnosed with tests, such as:  Tests that check how long it takes food to move through the stomach and intestines. These tests include:  Upper gastrointestinal (GI) series. In this test,  X-rays of the intestines are taken after you drink a liquid. The liquid makes the intestines show up better on the X-rays.  Gastric emptying scintigraphy. In this test, scans are taken after you eat food that contains a small amount of radioactive material.  Wireless capsule GI monitoring system. This test involves swallowing a capsule that records information about movement through the stomach.  Gastric manometry. This test measures electrical and muscular activity in the stomach. It is done with a thin tube that is passed down the throat and into the stomach.  Endoscopy. This test checks for abnormalities in the lining of the stomach. It is done with a long, thin tube that is passed down the throat and into the stomach.  An ultrasound. This test can help rule out gallbladder disease or pancreatitis as a cause of your symptoms. It uses sound waves to take pictures of the inside of your body. How is this treated? There is no cure for gastroparesis. This condition may be managed with:  Treatment of the underlying condition causing the gastroparesis.  Lifestyle changes, including exercise and dietary changes. Dietary changes can include:  Changes in what and when you eat.  Eating smaller meals more often.  Eating low-fat foods.  Eating low-fiber forms of high-fiber foods, such as cooked vegetables instead of raw vegetables.  Having liquid foods in place of solid foods. Liquid foods are easier to digest.  Medicines. These may be given to control nausea and vomiting and to stimulate stomach muscles.  Getting food through a feeding tube. This may be done in severe cases.  A gastric neurostimulator. This is a device that is  inserted into the body with surgery. It helps improve stomach emptying and control nausea and vomiting. Follow these instructions at home:  Follow your health care provider's instructions about exercise and diet.  Take medicines only as directed by your health care  provider. Contact a health care provider if:  Your symptoms do not improve with treatment.  You have new symptoms. Get help right away if:  You have severe abdominal pain that does not improve with treatment.  You have nausea that does not go away.  You cannot keep fluids down. This information is not intended to replace advice given to you by your health care provider. Make sure you discuss any questions you have with your health care provider. Document Released: 12/13/2005 Document Revised: 05/20/2016 Document Reviewed: 12/09/2014  2017 Elsevier   Please help [name] help you:  It is a privilege to be able to take care of great patients such as yourself. We are honored you have chosen [facility] for your Primary Care home. Below you will find basic instructions that you may need to access in the future. Please help [name] help you by reading the instructions, which cover many of the frequent questions we experience.   Prescription refills and request:  -In order to allow more efficient response time, please call your pharmacy for all refills. They will forward the request electronically to [name]. This allows for the quickest possible response. Request left on a nurse line can take longer to refill, since these are checked as time allows between office patients and other phone calls.  - refill request can take up to 3-5 working days to complete.  - If request is sent electronically and request is appropiate, it is usually completed in 1-2 business days.  - all patients will need to be seen routinely for all chronic medical conditions requiring prescription medications (see follow-up below). If you are overdue for follow up on your condition, you will be asked to make an appointment and we will call in enough medication to cover you until your appointment (up to 30 days).  - all controlled substances will require a face to face visit to request/refill.  - if you desire your prescriptions to go  through a new pharmacy, and have an active script at original pharmacy, you will need to call your pharmacy and have scripts transferred to new pharmacy. This is completed between the pharmacy locations and not by your provider.    Results: If any images or labs were ordered, it can take up to 1 week to get results depending on the test ordered and the lab/facility running and resulting the test. - Normal or stable results, which do not need further discussion, will be released to your mychart immediately with attached note to you. A call will not be generated for normal results. Please make certain to sign up for mychart. If you have questions on how to activate your mychart you can call the front office.  - If your results need further discussion, our office will attempt to contact you via phone, and if unable to reach you after 2 attempts, we will release your abnormal result to your mychart with instructions.  - All results will be automatically released in mychart after 1 week.  - Your provider will provide you with explanation and instruction on all relevant material in your results. Please keep in mind, results and labs may appear confusing or abnormal to the untrained eye, but it does not mean they are  actually abnormal for you personally. If you have any questions about your results that are not covered, or you desire more detailed explanation than what was provided, you should make an appointment with your provider to do so.   Our office handles many outgoing and incoming calls daily. If we have not contacted you within 1 week about your results, please check your mychart to see if there is a message first and if not, then contact our office.  In helping with this matter, you help decrease call volume, and therefore allow [name] to be able to respond to patients needs more efficiently.   Acute office visits (sick visit):  An acute visit is intended for a new problem and are scheduled in shorter  time slots to allow schedule openings for patients with new problems. This is the appropriate visit to discuss a new problem. In order to provide you with excellent quality medical care with proper time for you to explain your problem, have an exam and receive treatment with instructions, these appointments should be limited to one new problem per visit. If you experience a new problem, in which you desire to be addressed, please make an acute office visit, we save openings on the schedule to accommodate you. Please do not save your new problem for any other type of visit, let us take care of it properly and quickly for you.   Follow up visits:  Depending on your condition(s) your provider will need to see you routinely in order to provide you with quality care and prescribe medication(s). Most chronic conditions (Example: hypertension, Diabetes, depression/anxiety... etc), require visits a couple times a year. Your provider will instruct you on proper follow up for your personal medical conditions and history. Please make certain to make follow up appointments for your condition as instructed. Failing to do so could result in lapse in your medication treatment/refills. If you request a refill, and are overdue to be seen on a condition, we will always provide you with a 30 day script (once) to allow you time to schedule.    Medicare wellness (well visit): - we have a wonderful Nurse Maudie Mercury), that will meet with you and provide you will yearly medicare wellness visits. These visits should occur yearly (can not be scheduled less than 1 calendar year apart) and cover preventive health, immunizations, advance directives and screenings you are entitled to yearly through your medicare benefits. Do not miss out on your entitled benefits, this is when medicare will pay for these benefits to be ordered for you.  These are strongly encouraged by your provider and is the appropriate type of visit to make certain you are  up to date with all preventive health benefits. If you have not had your medicare wellness exam in the last 12 months, please make certain to schedule one by calling the office and schedule your medicare wellness with Maudie Mercury as soon as possible.   Yearly physical (well visit):  - Adults are recommended to be seen yearly for physicals. Check with your insurance and date of your last physical, most insurances require one calendar year between physicals. Physicals include all preventive health topics, screenings, medical exam and labs that are appropriate for gender/age and history. You may have fasting labs needed at this visit. This is a well visit (not a sick visit), acute topics should not be covered during this visit.  - Pediatric patients are seen more frequently when they are younger. Your provider will advise you on well  child visit timing that is appropriate for your their age. - This is not a medicare wellness visit. Medicare wellness exams do not have an exam portion to the visit. Some medicare companies allow for a physical, some do not allow a yearly physical. If your medicare allows a yearly physical you can schedule the medicare wellness with our nurse Maudie Mercury and have your physical with your provider after, on the same day. Please check with insurance for your full benefits.   Late Policy/No Shows:  - all new patients should arrive 15-30 minutes earlier than appointment to allow [name] time  to  obtain all personal demographics,  insurance information and for you to complete office paperwork. - All established patients should arrive 10-15 minutes earlier than appointment time to update all information and be checked in .  - In our best efforts to run on time, if you are late for your appointment you will be asked to either reschedule or if able, we will work you back into the schedule. There will be a wait time to work you back in the schedule,  depending on availability.  - If you are unable to make it  to your appointment as scheduled, please call 24 hours ahead of time to allow [name] to fill the time slot with someone else who needs to be seen. If you do not cancel your appointment ahead of time, you may be charged a no show fee.
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
## 2016-12-31 ENCOUNTER — Encounter (HOSPITAL_BASED_OUTPATIENT_CLINIC_OR_DEPARTMENT_OTHER): Payer: Managed Care, Other (non HMO) | Attending: Internal Medicine

## 2016-12-31 DIAGNOSIS — I129 Hypertensive chronic kidney disease with stage 1 through stage 4 chronic kidney disease, or unspecified chronic kidney disease: Secondary | ICD-10-CM | POA: Diagnosis not present

## 2016-12-31 DIAGNOSIS — E1140 Type 2 diabetes mellitus with diabetic neuropathy, unspecified: Secondary | ICD-10-CM | POA: Diagnosis not present

## 2016-12-31 DIAGNOSIS — E1122 Type 2 diabetes mellitus with diabetic chronic kidney disease: Secondary | ICD-10-CM | POA: Diagnosis not present

## 2016-12-31 DIAGNOSIS — T25221D Burn of second degree of right foot, subsequent encounter: Secondary | ICD-10-CM | POA: Diagnosis not present

## 2016-12-31 DIAGNOSIS — N184 Chronic kidney disease, stage 4 (severe): Secondary | ICD-10-CM | POA: Insufficient documentation

## 2016-12-31 DIAGNOSIS — X58XXXD Exposure to other specified factors, subsequent encounter: Secondary | ICD-10-CM | POA: Insufficient documentation

## 2016-12-31 DIAGNOSIS — E11621 Type 2 diabetes mellitus with foot ulcer: Secondary | ICD-10-CM | POA: Insufficient documentation

## 2016-12-31 DIAGNOSIS — D649 Anemia, unspecified: Secondary | ICD-10-CM | POA: Insufficient documentation

## 2017-01-06 ENCOUNTER — Other Ambulatory Visit (HOSPITAL_COMMUNITY): Payer: Self-pay | Admitting: *Deleted

## 2017-01-07 ENCOUNTER — Encounter (HOSPITAL_COMMUNITY)
Admission: RE | Admit: 2017-01-07 | Discharge: 2017-01-07 | Disposition: A | Discharge status: Home / Self Care | Payer: Managed Care, Other (non HMO) | Source: Ambulatory Visit | Attending: Nephrology | Admitting: Nephrology

## 2017-01-07 DIAGNOSIS — N182 Chronic kidney disease, stage 2 (mild): Secondary | ICD-10-CM

## 2017-01-07 LAB — POCT HEMOGLOBIN-HEMACUE: Hemoglobin: 8.6 g/dL — ABNORMAL LOW (ref 12.0–15.0)

## 2017-01-07 LAB — IRON AND TIBC
Iron: 85 ug/dL (ref 28–170)
SATURATION RATIOS: 39 % — AB (ref 10.4–31.8)
TIBC: 220 ug/dL — AB (ref 250–450)
UIBC: 135 ug/dL

## 2017-01-07 LAB — FERRITIN: FERRITIN: 654 ng/mL — AB (ref 11–307)

## 2017-01-07 MED ORDER — EPOETIN ALFA 40000 UNIT/ML IJ SOLN
INTRAMUSCULAR | Status: AC
  Filled 2017-01-07: qty 1

## 2017-01-07 MED ORDER — EPOETIN ALFA 40000 UNIT/ML IJ SOLN
40000.0000 [IU] | INTRAMUSCULAR | Status: DC
  Administered 2017-01-07: 40000 [IU] via SUBCUTANEOUS

## 2017-01-10 DIAGNOSIS — E11621 Type 2 diabetes mellitus with foot ulcer: Secondary | ICD-10-CM | POA: Diagnosis not present

## 2017-01-13 ENCOUNTER — Encounter: Payer: Self-pay | Admitting: Vascular Surgery

## 2017-01-15 ENCOUNTER — Emergency Department (HOSPITAL_COMMUNITY): Payer: Managed Care, Other (non HMO)

## 2017-01-15 ENCOUNTER — Encounter (HOSPITAL_COMMUNITY): Payer: Self-pay

## 2017-01-15 ENCOUNTER — Emergency Department (HOSPITAL_COMMUNITY)
Admission: EM | Admit: 2017-01-15 | Discharge: 2017-01-15 | Disposition: A | Discharge status: Home / Self Care | Payer: Managed Care, Other (non HMO) | Attending: Emergency Medicine | Admitting: Emergency Medicine

## 2017-01-15 DIAGNOSIS — Z79899 Other long term (current) drug therapy: Secondary | ICD-10-CM | POA: Insufficient documentation

## 2017-01-15 DIAGNOSIS — Z4800 Encounter for change or removal of nonsurgical wound dressing: Secondary | ICD-10-CM | POA: Insufficient documentation

## 2017-01-15 DIAGNOSIS — E1122 Type 2 diabetes mellitus with diabetic chronic kidney disease: Secondary | ICD-10-CM | POA: Diagnosis not present

## 2017-01-15 DIAGNOSIS — Z794 Long term (current) use of insulin: Secondary | ICD-10-CM | POA: Diagnosis not present

## 2017-01-15 DIAGNOSIS — N184 Chronic kidney disease, stage 4 (severe): Secondary | ICD-10-CM | POA: Diagnosis not present

## 2017-01-15 DIAGNOSIS — I129 Hypertensive chronic kidney disease with stage 1 through stage 4 chronic kidney disease, or unspecified chronic kidney disease: Secondary | ICD-10-CM | POA: Insufficient documentation

## 2017-01-15 DIAGNOSIS — Z5189 Encounter for other specified aftercare: Secondary | ICD-10-CM

## 2017-01-15 MED ORDER — OXYCODONE-ACETAMINOPHEN 5-325 MG PO TABS: 1.0000 | ORAL_TABLET | ORAL | 0 refills | Status: DC | PRN

## 2017-01-15 MED ORDER — OXYCODONE-ACETAMINOPHEN 5-325 MG PO TABS
1.0000 | ORAL_TABLET | Freq: Once | ORAL | Status: AC
  Administered 2017-01-15: 1 via ORAL
  Filled 2017-01-15: qty 1

## 2017-01-15 MED ORDER — DOXYCYCLINE HYCLATE 100 MG PO CAPS: 100.0000 mg | ORAL_CAPSULE | Freq: Two times a day (BID) | ORAL | 0 refills | Status: DC
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
## 2017-01-15 NOTE — Discharge Instructions (Signed)
Start taking doxycycline for possible wound infection. You may take Percocet every 4-6 hours for severe pain. Please go to your scheduled wound care appointment. Return to emergency department for fever, chills, nausea, vomiting, uncontrolled pain, or any new or concerning symptoms.

## 2017-01-15 NOTE — ED Notes (Signed)
Patient d/c'd self care.  F/U and medications reviewed.  Patient verbalized understanding. 
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
## 2017-01-17 DIAGNOSIS — E11621 Type 2 diabetes mellitus with foot ulcer: Secondary | ICD-10-CM | POA: Diagnosis not present

## 2017-01-17 NOTE — Progress Notes (Signed)
    Postoperative Access Visit   History of Present Illness  Heather [name] is a 46 y.o. 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  The patient's wounds are healed.  The patient notes no steal symptoms.  The patient is able to complete their activities of daily living.  The patient's current symptoms are: none.  For VQI Use Only  PRE-ADM LIVING: Home  AMB STATUS: Ambulatory   Physical Examination Vitals:   01/19/17 0915 01/19/17 0918  BP: (!) 168/83 (!) 164/84  Pulse: 90   Resp: 20   Temp: 97.9 F (36.6 C)     LUE: Incision is healed, skin feels warm, hand grip is 5/5, sensation in digits is intact, palpable thrill, bruit can be auscultated, on Sonosite: distal fistula intermittently collapses, Distal 1/2 4 mm, proximal 1/2 >6 mm  Medical Decision Making  Heather [name] is a 46 y.o. year old female who presents s/p L 1st BRVT.   I doubt this fistula will mature as we have waited >3 months now.  The behavior of the distal fistula suggests it will likely occlude at some point.  I do not routinely place AVG as backup for peritoneal dialysis, so I would hold off on AVG placement at this point.    The patient's anatomy should be compatible with a LUA AVG especially given the proximal brachial vein has been further distended by the L 1st BRVT.  Thank you for allowing [name] to participate in this patient's care.  Adele Barthel, MD, FACS [facility] Office: [phone] Pager: [phone]

## 2017-01-19 ENCOUNTER — Ambulatory Visit (INDEPENDENT_AMBULATORY_CARE_PROVIDER_SITE_OTHER): Payer: Self-pay | Admitting: Vascular Surgery

## 2017-01-19 ENCOUNTER — Encounter: Payer: Self-pay | Admitting: Vascular Surgery

## 2017-01-19 VITALS — BP 164/84 | HR 90 | Temp 97.9°F | Resp 20 | Ht 69.0 in | Wt 245.7 lb

## 2017-01-19 DIAGNOSIS — N184 Chronic kidney disease, stage 4 (severe): Secondary | ICD-10-CM

## 2017-01-21 ENCOUNTER — Encounter (HOSPITAL_COMMUNITY)
Admission: RE | Admit: 2017-01-21 | Discharge: 2017-01-21 | Disposition: A | Discharge status: Home / Self Care | Payer: Managed Care, Other (non HMO) | Source: Ambulatory Visit | Attending: Nephrology | Admitting: Nephrology

## 2017-01-21 DIAGNOSIS — N182 Chronic kidney disease, stage 2 (mild): Secondary | ICD-10-CM | POA: Diagnosis not present

## 2017-01-21 LAB — POCT HEMOGLOBIN-HEMACUE: Hemoglobin: 9.7 g/dL — ABNORMAL LOW (ref 12.0–15.0)

## 2017-01-21 MED ORDER — EPOETIN ALFA 40000 UNIT/ML IJ SOLN
INTRAMUSCULAR | Status: AC
  Filled 2017-01-21: qty 1

## 2017-01-21 MED ORDER — EPOETIN ALFA 40000 UNIT/ML IJ SOLN
40000.0000 [IU] | INTRAMUSCULAR | Status: DC
  Administered 2017-01-21: 09:00:00 40000 [IU] via SUBCUTANEOUS

## 2017-01-25 ENCOUNTER — Telehealth: Payer: Self-pay | Admitting: *Deleted

## 2017-01-25 MED ORDER — AMOXICILLIN-POT CLAVULANATE 875-125 MG PO TABS: 1.0000 | ORAL_TABLET | Freq: Two times a day (BID) | ORAL | 0 refills | Status: DC

## 2017-01-25 NOTE — Telephone Encounter (Signed)
Left message with information and detailed instructions on patient voice mail per [facility]

## 2017-01-25 NOTE — Telephone Encounter (Signed)
Patient called and left message stating she has a bad toothache and cannot afford to go to the dentist at this time she is requesting an antibiotic for this .She states she does not need pain medication. Please advise.

## 2017-01-25 NOTE — Telephone Encounter (Addendum)
I  Have called in augmentin for her for this occasion. In the future she will need to be seen or should be seen in an UC. If she experiences worsening symptoms or fever while on abx, she needs to go to UC.  - She can either pick up or we can mail her a copy of the dental clinics offered in this area. [name] strongly encouraged to call and get her name on these lists to have eval for current issue and even regular dental maintenance. If this is reoccurring her tooth needs dental care and can be completed through one of those clinics at discounted cost or free depending on her qualifications.

## 2017-01-27 ENCOUNTER — Encounter (HOSPITAL_BASED_OUTPATIENT_CLINIC_OR_DEPARTMENT_OTHER): Payer: Managed Care, Other (non HMO) | Attending: Internal Medicine

## 2017-01-27 DIAGNOSIS — I10 Essential (primary) hypertension: Secondary | ICD-10-CM | POA: Insufficient documentation

## 2017-01-27 DIAGNOSIS — L97512 Non-pressure chronic ulcer of other part of right foot with fat layer exposed: Secondary | ICD-10-CM | POA: Diagnosis not present

## 2017-01-27 DIAGNOSIS — E11621 Type 2 diabetes mellitus with foot ulcer: Secondary | ICD-10-CM | POA: Diagnosis not present

## 2017-01-27 DIAGNOSIS — L97319 Non-pressure chronic ulcer of right ankle with unspecified severity: Secondary | ICD-10-CM | POA: Diagnosis not present

## 2017-01-27 DIAGNOSIS — E1140 Type 2 diabetes mellitus with diabetic neuropathy, unspecified: Secondary | ICD-10-CM | POA: Insufficient documentation

## 2017-02-03 DIAGNOSIS — E11621 Type 2 diabetes mellitus with foot ulcer: Secondary | ICD-10-CM | POA: Diagnosis not present

## 2017-02-04 ENCOUNTER — Encounter (HOSPITAL_COMMUNITY)
Admission: RE | Admit: 2017-02-04 | Discharge: 2017-02-04 | Disposition: A | Discharge status: Home / Self Care | Payer: Managed Care, Other (non HMO) | Source: Ambulatory Visit | Attending: Nephrology | Admitting: Nephrology

## 2017-02-04 DIAGNOSIS — N182 Chronic kidney disease, stage 2 (mild): Secondary | ICD-10-CM

## 2017-02-04 LAB — POCT HEMOGLOBIN-HEMACUE: HEMOGLOBIN: 10.4 g/dL — AB (ref 12.0–15.0)

## 2017-02-04 MED ORDER — EPOETIN ALFA 40000 UNIT/ML IJ SOLN
INTRAMUSCULAR | Status: AC
  Filled 2017-02-04: qty 1

## 2017-02-04 MED ORDER — EPOETIN ALFA 40000 UNIT/ML IJ SOLN
40000.0000 [IU] | INTRAMUSCULAR | Status: DC
  Administered 2017-02-04: 40000 [IU] via SUBCUTANEOUS

## 2017-02-10 DIAGNOSIS — E11621 Type 2 diabetes mellitus with foot ulcer: Secondary | ICD-10-CM | POA: Diagnosis not present

## 2017-02-17 ENCOUNTER — Emergency Department (HOSPITAL_BASED_OUTPATIENT_CLINIC_OR_DEPARTMENT_OTHER)
Admission: EM | Admit: 2017-02-17 | Discharge: 2017-02-17 | Disposition: A | Discharge status: Home / Self Care | Payer: Managed Care, Other (non HMO) | Attending: Emergency Medicine | Admitting: Emergency Medicine

## 2017-02-17 ENCOUNTER — Emergency Department (HOSPITAL_BASED_OUTPATIENT_CLINIC_OR_DEPARTMENT_OTHER): Payer: Managed Care, Other (non HMO)

## 2017-02-17 ENCOUNTER — Encounter (HOSPITAL_BASED_OUTPATIENT_CLINIC_OR_DEPARTMENT_OTHER): Payer: Self-pay | Admitting: *Deleted

## 2017-02-17 DIAGNOSIS — Z794 Long term (current) use of insulin: Secondary | ICD-10-CM | POA: Insufficient documentation

## 2017-02-17 DIAGNOSIS — E1122 Type 2 diabetes mellitus with diabetic chronic kidney disease: Secondary | ICD-10-CM | POA: Insufficient documentation

## 2017-02-17 DIAGNOSIS — Z79899 Other long term (current) drug therapy: Secondary | ICD-10-CM | POA: Insufficient documentation

## 2017-02-17 DIAGNOSIS — N189 Chronic kidney disease, unspecified: Secondary | ICD-10-CM | POA: Diagnosis not present

## 2017-02-17 DIAGNOSIS — R079 Chest pain, unspecified: Secondary | ICD-10-CM | POA: Diagnosis not present

## 2017-02-17 DIAGNOSIS — I129 Hypertensive chronic kidney disease with stage 1 through stage 4 chronic kidney disease, or unspecified chronic kidney disease: Secondary | ICD-10-CM | POA: Diagnosis not present

## 2017-02-17 DIAGNOSIS — E11621 Type 2 diabetes mellitus with foot ulcer: Secondary | ICD-10-CM | POA: Diagnosis not present

## 2017-02-17 DIAGNOSIS — R0602 Shortness of breath: Secondary | ICD-10-CM | POA: Insufficient documentation

## 2017-02-17 DIAGNOSIS — M25512 Pain in left shoulder: Secondary | ICD-10-CM | POA: Insufficient documentation

## 2017-02-17 LAB — CBC
HCT: 31.6 % — ABNORMAL LOW (ref 36.0–46.0)
HEMOGLOBIN: 10 g/dL — AB (ref 12.0–15.0)
MCH: 29.5 pg (ref 26.0–34.0)
MCHC: 31.6 g/dL (ref 30.0–36.0)
MCV: 93.2 fL (ref 78.0–100.0)
PLATELETS: 277 10*3/uL (ref 150–400)
RBC: 3.39 MIL/uL — AB (ref 3.87–5.11)
RDW: 13.3 % (ref 11.5–15.5)
WBC: 6.2 10*3/uL (ref 4.0–10.5)

## 2017-02-17 LAB — TROPONIN I: Troponin I: <0.03 ng/mL (ref <0.03)

## 2017-02-17 LAB — BASIC METABOLIC PANEL
ANION GAP: 7 (ref 5–15)
BUN: 33 mg/dL — ABNORMAL HIGH (ref 6–20)
CHLORIDE: 106 mmol/L (ref 101–111)
CO2: 22 mmol/L (ref 22–32)
Calcium: 8.3 mg/dL — ABNORMAL LOW (ref 8.9–10.3)
Creatinine, Ser: 3.21 mg/dL — ABNORMAL HIGH (ref 0.44–1.00)
GFR calc non Af Amer: 16 mL/min — ABNORMAL LOW (ref >60)
GFR, EST AFRICAN AMERICAN: 19 mL/min — AB (ref >60)
Glucose, Bld: 90 mg/dL (ref 65–99)
POTASSIUM: 3.6 mmol/L (ref 3.5–5.1)
SODIUM: 135 mmol/L (ref 135–145)

## 2017-02-17 NOTE — ED Provider Notes (Signed)
[facility] DEPT [facility] Provider Note   CSN: [id number] Arrival date & time: 02/17/17  1611     History   Chief Complaint Chief Complaint  Patient presents with  . Shoulder Pain  . Chest Pain    HPI Heather [name] is a 45 y.o. female.  HPI Patient presents with left shoulder pain. Has come and gone over the last 3 days. It is dull. It is on her superior posterior shoulder. Not worse with movements. Comes and goes. Last around 30 minutes when it happens. Not associated with exertion. Slight shortness of breath. No cough. No palpitations. No swelling or legs. Does have a history of chronic kidney disease.   Past Medical History:  Diagnosis Date  . Allergy   . Anemia    chronic disease  . Blood transfusion without reported diagnosis   . Chronic constipation   . Chronic kidney disease   . Diabetes mellitus    Type II  . Fibroids    leiomyoma 8/04 per [name]  . Fibromyalgia   . Gastroparesis    DM uncontrolled.   . Headache   . Hyperlipemia   . Hypertension   . IBS (irritable bowel syndrome)   . Infertility    eval at [facility]  . Obesity   . Pancreatitis     Patient Active Problem List   Diagnosis Date Noted  . Second degree burn of right foot 12/24/2016  . Epigastric pain 11/25/2016  . Morbid obesity due to excess calories ([place]) 11/16/2016  . Upper airway cough syndrome 11/15/2016  . DM (diabetes mellitus), type 2, uncontrolled, with renal complications ([place]) 73/41/9379  . Anemia of chronic disease 07/22/2016  . Right-sided low back pain without sciatica 12/08/2015  . Diabetic retinopathy ([place]) 09/29/2015  . Preventative health care 09/23/2014  . 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 B12 deficiency 05/10/2014  . Anemia in chronic kidney disease 04/26/2014  . Chronic kidney disease, stage IV (severe) ([place]) 04/26/2014  . Heart murmur, systolic 02/40/9735  . Secondary hyperthyroidism 11/27/2013  . Diabetic neuropathy, painful ([place]) 07/13/2013  . Leiomyoma  of uterus, unspecified 02/19/2011  . Diabetes mellitus, type II, insulin dependent ([place]) 02/19/2011  . Essential hypertension 02/19/2011  . Gastroparesis 02/19/2011    Past Surgical History:  Procedure Laterality Date  . BASCILIC VEIN TRANSPOSITION Left 08/31/2016   Procedure: BASILIC VEIN TRANSPOSITION FIRST STAGE;  Surgeon: Conrad Sinai, MD;  Location: [facility];  Service: Vascular;  Laterality: Left;  . EYE SURGERY     bilateral, vitrectomies  . fibroid tumors removed    . FOOT SURGERY     bilateral    OB History    Gravida Para Term Preterm AB Living   2 1     1 1    SAB TAB Ectopic Multiple Live Births   1               Home Medications    Prior to Admission medications   Medication Sig Start Date End Date Taking? Authorizing Provider  amoxicillin-clavulanate (AUGMENTIN) 875-125 MG tablet Take 1 tablet by mouth 2 (two) times daily. 01/25/17   Renee A Kuneff, DO  calcitRIOL (ROCALTROL) 0.25 MCG capsule Take 0.5 mcg by mouth at bedtime.     Historical Provider, MD  cyclobenzaprine (FLEXERIL) 10 MG tablet Take 1 tablet (10 mg total) by mouth 2 (two) times daily as needed for muscle spasms. 10/26/16   Courteney Lyn Mackuen, MD  doxycycline (VIBRAMYCIN) 100 MG capsule Take 1 capsule (  100 mg total) by mouth 2 (two) times daily. 01/15/17   Gloriann Loan, PA-C  epoetin alfa (EPOGEN,PROCRIT) 53646 UNIT/ML injection Inject 30,000 Units into the vein every 14 (fourteen) days.     Historical Provider, MD  furosemide (LASIX) 80 MG tablet Take 1 tablet (80 mg total) by mouth 2 (two) times daily. Patient taking differently: Take 160 mg by mouth daily as needed for fluid.  04/12/15   Irene Pap, NP  gabapentin (NEURONTIN) 300 MG capsule Take 1 capsule (300 mg total) by mouth 2 (two) times daily as needed. Patient taking differently: Take 900 mg by mouth daily.  10/07/15   Renee A Kuneff, DO  glucose blood (ONETOUCH VERIO) test strip Test 4 times daily. Patient taking differently: 1 each by Other  route 3 (three) times daily.  07/17/15   Irene Pap, NP  insulin aspart (NOVOLOG) 100 UNIT/ML FlexPen Inject 10-15 Units into the skin See admin instructions. Inject 10 units subque in the morning and inject 15 units subqu at lunch and dinner 08/12/16   Historical Provider, MD  insulin degludec (TRESIBA FLEXTOUCH) 100 UNIT/ML SOPN FlexTouch Pen Inject 26 Units into the skin daily with lunch.    Historical Provider, MD  linaclotide Rolan Lipa) 290 MCG CAPS capsule Take 1 capsule (290 mcg total) by mouth daily before breakfast. 11/25/16   Renee A Kuneff, DO  lisinopril (PRINIVIL,ZESTRIL) 40 MG tablet Take 1 tablet (40 mg total) by mouth daily. 01/01/16   Renee A Kuneff, DO  metoCLOPramide (REGLAN) 5 MG tablet Take 1 tablet (5 mg total) by mouth 2 (two) times daily. 1 tab BID for 5 days 12/24/16   Renee A Kuneff, DO  omeprazole (PRILOSEC) 40 MG capsule Take 1 capsule (40 mg total) by mouth daily. 11/26/16   Renee A Kuneff, DO  oxyCODONE-acetaminophen (PERCOCET/ROXICET) 5-325 MG tablet Take 1 tablet by mouth every 4 (four) hours as needed for severe pain. 01/15/17   Kayla Rose, PA-C  senna-docusate (SENOKOT-S) 8.6-50 MG tablet Take 2 tablets by mouth daily. 11/25/16   Ma Hillock, DO    Family History Family History  Problem Relation Age of Onset  . Colon polyps Mother   . Diabetes Mother   . Heart murmur Father   . Hypertension Father   . Diabetes Sister   . Kidney failure Sister   . Diabetes Brother   . Retinal degeneration Brother   . Heart murmur Son   . Stroke Paternal Grandmother   . Diabetes Sister     Social History Social History  Substance Use Topics  . Smoking status: Never Smoker  . Smokeless tobacco: Never Used  . Alcohol use No     Allergies   Ibuprofen and Dilaudid [hydromorphone hcl]   Review of Systems Review of Systems  Constitutional: Negative for appetite change.  HENT: Negative for congestion.   Eyes: Negative for redness.  Respiratory: Negative for cough.    Cardiovascular: Positive for chest pain.  Gastrointestinal: Negative for abdominal pain.  Genitourinary: Negative for dysuria.  Musculoskeletal:       Left shoulder pain  Neurological: Negative for light-headedness.  Hematological: Negative for adenopathy.  Psychiatric/Behavioral: Negative for confusion.     Physical Exam Updated Vital Signs BP 152/85   Pulse 81   Temp 98.1 F (36.7 C) (Oral)   Resp 17   Ht 5\' 8"  (1.727 m)   Wt 240 lb (108.9 kg)   LMP 02/10/2017   SpO2 100%   BMI 36.49 kg/m  Physical Exam  Constitutional: She appears well-developed and well-nourished.  HENT:  Head: Atraumatic.  Eyes: EOM are normal.  Neck: Neck supple.  Cardiovascular: Normal rate.   Pulmonary/Chest: Effort normal. She exhibits no tenderness.  Abdominal: Soft.  Musculoskeletal: She exhibits no tenderness.  Painless range of motion  in her left shoulder. Neurovascular intact in left hand.  Skin: Capillary refill takes less than 2 seconds.  Psychiatric: She has a normal mood and affect.     ED Treatments / Results  Labs (all labs ordered are listed, but only abnormal results are displayed) Labs Reviewed  BASIC METABOLIC PANEL - Abnormal; Notable for the following:       Result Value   BUN 33 (*)    Creatinine, Ser 3.21 (*)    Calcium 8.3 (*)    GFR calc non Af Amer 16 (*)    GFR calc Af Amer 19 (*)    All other components within normal limits  CBC - Abnormal; Notable for the following:    RBC 3.39 (*)    Hemoglobin 10.0 (*)    HCT 31.6 (*)    All other components within normal limits  TROPONIN I    EKG  EKG Interpretation  Date/Time:  Thursday February 17 2017 16:24:42 EST Ventricular Rate:  85 PR Interval:    QRS Duration: 88 QT Interval:  385 QTC Calculation: 458 R Axis:   18 Text Interpretation:  Sinus rhythm Probable left atrial enlargement Low voltage, precordial leads Confirmed by Alvino Chapel  MD, [place] Kenley [phone]) on 02/17/2017 4:39:58 PM        Radiology Dg Chest 2 View  Result Date: 02/17/2017 CLINICAL DATA:  Acute onset of left shoulder pain. Chest heaviness and shortness of breath. Initial encounter. EXAM: CHEST  2 VIEW COMPARISON:  Chest radiograph performed 11/15/2016 FINDINGS: The lungs are well-aerated. Mild vascular congestion is noted. There is no evidence of focal opacification, pleural effusion or pneumothorax. The heart is normal in size; the mediastinal contour is within normal limits. No acute osseous abnormalities are seen. IMPRESSION: Mild vascular congestion.  Lungs remain grossly clear. Electronically Signed   By: Garald Balding M.D.   On: 02/17/2017 18:40    Procedures Procedures (including critical care time)  Medications Ordered in ED Medications - No data to display   Initial Impression / Assessment and Plan / ED Course  I have reviewed the triage vital signs and the nursing notes.  Pertinent labs & imaging results that were available during my care of the patient were reviewed by me and considered in my medical decision making (see chart for details).     Patient with chest pain and shoulder pain. EKG and labs reassuring. Enzymes negative. Doubt this is cardiac cause. Will discharge home to follow-up with primary care doctor.  Final Clinical Impressions(s) / ED Diagnoses   Final diagnoses:  Acute pain of left shoulder    New Prescriptions Discharge Medication List as of 02/17/2017  8:43 PM       Davonna Belling, MD 02/17/17 2355

## 2017-02-17 NOTE — ED Notes (Signed)
IV attempts x2 unsuccessful, M Simms at [facility] to attempt

## 2017-02-17 NOTE — ED Notes (Signed)
IV attempted x2 without success.

## 2017-02-17 NOTE — ED Notes (Signed)
Right radial artery draw for labs, + Allen's test prior to draw.  Dr Alvino Chapel aware.  Blood work in lab.

## 2017-02-17 NOTE — ED Triage Notes (Signed)
Left shoulder pain x 3 days. No known injury. Pain comes and goes. Heaviness in her chest and SOB. Drove here. [name] ambulatory.

## 2017-02-17 NOTE — ED Notes (Addendum)
IV attempted x 2 by Ezzard Flax, RN and x1 by Baxter Hire,  RN without success. Dr. Alvino Chapel advises we can do an arterial stick for blood draw.

## 2017-02-17 NOTE — ED Notes (Signed)
Missed IV attempt x 2, tol well

## 2017-02-18 ENCOUNTER — Encounter (HOSPITAL_COMMUNITY)
Admission: RE | Admit: 2017-02-18 | Discharge: 2017-02-18 | Disposition: A | Discharge status: Home / Self Care | Payer: Managed Care, Other (non HMO) | Source: Ambulatory Visit | Attending: Nephrology | Admitting: Nephrology

## 2017-02-18 DIAGNOSIS — N182 Chronic kidney disease, stage 2 (mild): Secondary | ICD-10-CM | POA: Diagnosis not present

## 2017-02-18 LAB — IRON AND TIBC
IRON: 49 ug/dL (ref 28–170)
Saturation Ratios: 24 % (ref 10.4–31.8)
TIBC: 202 ug/dL — AB (ref 250–450)
UIBC: 153 ug/dL

## 2017-02-18 LAB — POCT HEMOGLOBIN-HEMACUE: Hemoglobin: 10.7 g/dL — ABNORMAL LOW (ref 12.0–15.0)

## 2017-02-18 LAB — FERRITIN: FERRITIN: 572 ng/mL — AB (ref 11–307)

## 2017-02-18 MED ORDER — EPOETIN ALFA 40000 UNIT/ML IJ SOLN
40000.0000 [IU] | INTRAMUSCULAR | Status: DC
  Administered 2017-02-18: 40000 [IU] via SUBCUTANEOUS

## 2017-02-18 MED ORDER — EPOETIN ALFA 40000 UNIT/ML IJ SOLN
INTRAMUSCULAR | Status: AC
  Filled 2017-02-18: qty 1

## 2017-02-24 ENCOUNTER — Encounter (HOSPITAL_BASED_OUTPATIENT_CLINIC_OR_DEPARTMENT_OTHER): Payer: Managed Care, Other (non HMO) | Attending: Internal Medicine

## 2017-02-24 DIAGNOSIS — E11621 Type 2 diabetes mellitus with foot ulcer: Secondary | ICD-10-CM | POA: Insufficient documentation

## 2017-02-24 DIAGNOSIS — L97512 Non-pressure chronic ulcer of other part of right foot with fat layer exposed: Secondary | ICD-10-CM | POA: Insufficient documentation

## 2017-02-24 DIAGNOSIS — I10 Essential (primary) hypertension: Secondary | ICD-10-CM | POA: Diagnosis not present

## 2017-02-24 DIAGNOSIS — E1140 Type 2 diabetes mellitus with diabetic neuropathy, unspecified: Secondary | ICD-10-CM | POA: Diagnosis not present

## 2017-02-28 ENCOUNTER — Encounter (HOSPITAL_COMMUNITY): Payer: Self-pay | Admitting: Emergency Medicine

## 2017-02-28 ENCOUNTER — Emergency Department (HOSPITAL_COMMUNITY)
Admission: EM | Admit: 2017-02-28 | Discharge: 2017-02-28 | Disposition: A | Discharge status: Home / Self Care | Payer: Managed Care, Other (non HMO) | Attending: Emergency Medicine | Admitting: Emergency Medicine

## 2017-02-28 DIAGNOSIS — I129 Hypertensive chronic kidney disease with stage 1 through stage 4 chronic kidney disease, or unspecified chronic kidney disease: Secondary | ICD-10-CM | POA: Diagnosis not present

## 2017-02-28 DIAGNOSIS — Z79899 Other long term (current) drug therapy: Secondary | ICD-10-CM | POA: Diagnosis not present

## 2017-02-28 DIAGNOSIS — N184 Chronic kidney disease, stage 4 (severe): Secondary | ICD-10-CM | POA: Insufficient documentation

## 2017-02-28 DIAGNOSIS — E1122 Type 2 diabetes mellitus with diabetic chronic kidney disease: Secondary | ICD-10-CM | POA: Diagnosis not present

## 2017-02-28 DIAGNOSIS — N760 Acute vaginitis: Secondary | ICD-10-CM | POA: Diagnosis not present

## 2017-02-28 DIAGNOSIS — R102 Pelvic and perineal pain: Secondary | ICD-10-CM | POA: Diagnosis present

## 2017-02-28 DIAGNOSIS — I10 Essential (primary) hypertension: Secondary | ICD-10-CM

## 2017-02-28 DIAGNOSIS — B9689 Other specified bacterial agents as the cause of diseases classified elsewhere: Secondary | ICD-10-CM | POA: Insufficient documentation

## 2017-02-28 DIAGNOSIS — Z794 Long term (current) use of insulin: Secondary | ICD-10-CM | POA: Insufficient documentation

## 2017-02-28 LAB — I-STAT CHEM 8, ED
BUN: 41 mg/dL — ABNORMAL HIGH (ref 6–20)
CREATININE: 3.3 mg/dL — AB (ref 0.44–1.00)
Calcium, Ion: 1.18 mmol/L (ref 1.15–1.40)
Chloride: 106 mmol/L (ref 101–111)
Glucose, Bld: 185 mg/dL — ABNORMAL HIGH (ref 65–99)
HEMATOCRIT: 38 % (ref 36.0–46.0)
Hemoglobin: 12.9 g/dL (ref 12.0–15.0)
POTASSIUM: 4.2 mmol/L (ref 3.5–5.1)
Sodium: 142 mmol/L (ref 135–145)
TCO2: 27 mmol/L (ref 0–100)

## 2017-02-28 LAB — WET PREP, GENITAL
Sperm: NONE SEEN
Trich, Wet Prep: NONE SEEN
Yeast Wet Prep HPF POC: NONE SEEN

## 2017-02-28 LAB — URINALYSIS, ROUTINE W REFLEX MICROSCOPIC
Bilirubin Urine: NEGATIVE
KETONES UR: NEGATIVE mg/dL
LEUKOCYTES UA: NEGATIVE
Nitrite: NEGATIVE
Specific Gravity, Urine: 1.017 (ref 1.005–1.030)
pH: 5 (ref 5.0–8.0)

## 2017-02-28 MED ORDER — AMLODIPINE BESYLATE 5 MG PO TABS
10.0000 mg | ORAL_TABLET | Freq: Once | ORAL | Status: AC
  Administered 2017-02-28: 10 mg via ORAL
  Filled 2017-02-28: qty 2

## 2017-02-28 MED ORDER — METRONIDAZOLE 500 MG PO TABS: 500.0000 mg | ORAL_TABLET | Freq: Two times a day (BID) | ORAL | 0 refills | Status: DC

## 2017-02-28 MED ORDER — HYDROCODONE-ACETAMINOPHEN 5-325 MG PO TABS
1.0000 | ORAL_TABLET | Freq: Once | ORAL | Status: AC
  Administered 2017-02-28: 1 via ORAL
  Filled 2017-02-28: qty 1

## 2017-02-28 NOTE — ED Notes (Signed)
RN will be drawing blood work and starting an IV

## 2017-02-28 NOTE — Discharge Instructions (Signed)
Please take all of your antibiotics until finished! It is very important to get your blood pressure more controlled! Please take your blood pressure medications daily as directed by your primary care physician. Please follow up with your primary care physician for further discussion of your blood pressure. You have been tested for gonorrhea and chlamydia. You will be notified in approximately 3 days if your results are positive. Please return to ER for fevers, vomiting, new or worsening symptoms, any additional concerns.

## 2017-02-28 NOTE — ED Notes (Signed)
Pt's last bp was 217/109 at 1925. Manual bp was taken with results 212/124.

## 2017-02-28 NOTE — ED Provider Notes (Signed)
[facility] DEPT Provider Note   CSN: [id number] Arrival date & time: 02/28/17  1641     History   Chief Complaint Chief Complaint  Patient presents with  . Back Pain  . Pelvic Pain    HPI Heather [name] is a 46 y.o. female.  The history is provided by the patient and medical records. No language interpreter was used.  Back Pain   Associated symptoms include pelvic pain. Pertinent negatives include no chest pain, no fever, no headaches, no abdominal pain and no dysuria.  Pelvic Pain  Pertinent negatives include no chest pain, no abdominal pain, no headaches and no shortness of breath.   Heather [name] is a 46 y.o. female  with a PMH of DM2, HTN, HLD, gastroparesis, fibromyalgia, CKD who presents to the Emergency Department complaining of pelvic pain, white vaginal discharge 1-2 days. Associated with aching low back pain. No medications taken prior to arrival for symptoms. No alleviating or aggravating factors noted. Patient states that she is married with husband as only sexual partner. Denies concerns for STDs. Denies abdominal pain, fever, urinary urgency/frequency, nausea, vomiting, diarrhea.   Past Medical History:  Diagnosis Date  . Allergy   . Anemia    chronic disease  . Blood transfusion without reported diagnosis   . Chronic constipation   . Chronic kidney disease   . Diabetes mellitus    Type II  . Fibroids    leiomyoma 8/04 per [name]  . Fibromyalgia   . Gastroparesis    DM uncontrolled.   . Headache   . Hyperlipemia   . Hypertension   . IBS (irritable bowel syndrome)   . Infertility    eval at [facility]  . Obesity   . Pancreatitis     Patient Active Problem List   Diagnosis Date Noted  . Second degree burn of right foot 12/24/2016  . Epigastric pain 11/25/2016  . Morbid obesity due to excess calories ([place]) 11/16/2016  . Upper airway cough syndrome 11/15/2016  . DM (diabetes mellitus), type 2, uncontrolled, with renal complications ([place]) 20/35/5974  .  Anemia of chronic disease 07/22/2016  . Right-sided low back pain without sciatica 12/08/2015  . Diabetic retinopathy ([place]) 09/29/2015  . Preventative health care 09/23/2014  . 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 B12 deficiency 05/10/2014  . Anemia in chronic kidney disease 04/26/2014  . Chronic kidney disease, stage IV (severe) ([place]) 04/26/2014  . Heart murmur, systolic 16/38/4536  . Secondary hyperthyroidism 11/27/2013  . Diabetic neuropathy, painful ([place]) 07/13/2013  . Leiomyoma of uterus, unspecified 02/19/2011  . Diabetes mellitus, type II, insulin dependent ([place]) 02/19/2011  . Essential hypertension 02/19/2011  . Gastroparesis 02/19/2011    Past Surgical History:  Procedure Laterality Date  . BASCILIC VEIN TRANSPOSITION Left 08/31/2016   Procedure: BASILIC VEIN TRANSPOSITION FIRST STAGE;  Surgeon: Conrad [place], MD;  Location: [facility];  Service: Vascular;  Laterality: Left;  . EYE SURGERY     bilateral, vitrectomies  . fibroid tumors removed    . FOOT SURGERY     bilateral    OB History    Gravida Para Term Preterm AB Living   2 1     1 1    SAB TAB Ectopic Multiple Live Births   1               Home Medications    Prior to Admission medications   Medication Sig Start Date End Date Taking? Authorizing Provider  acetaminophen (TYLENOL) 500  MG tablet Take 1,000 mg by mouth every 6 (six) hours as needed.   Yes Historical Provider, MD  calcitRIOL (ROCALTROL) 0.25 MCG capsule Take 0.5 mcg by mouth daily.    Yes Historical Provider, MD  epoetin alfa (EPOGEN,PROCRIT) 50277 UNIT/ML injection Inject 30,000 Units into the vein every 14 (fourteen) days.    Yes Historical Provider, MD  furosemide (LASIX) 80 MG tablet Take 1 tablet (80 mg total) by mouth 2 (two) times daily. Patient taking differently: Take 160 mg by mouth daily as needed for fluid.  04/12/15  Yes Irene Pap, NP  gabapentin (NEURONTIN) 300 MG capsule Take 1 capsule (300 mg total) by mouth 2 (two) times  daily as needed. Patient taking differently: Take 900 mg by mouth daily.  10/07/15  Yes Renee A Kuneff, DO  insulin aspart (NOVOLOG) 100 UNIT/ML FlexPen Inject 10-15 Units into the skin See admin instructions. Inject 10 units subque in the morning and inject 15 units subqu at lunch and dinner 08/12/16  Yes Historical Provider, MD  insulin degludec (TRESIBA FLEXTOUCH) 100 UNIT/ML SOPN FlexTouch Pen Inject 26 Units into the skin daily with breakfast.    Yes Historical Provider, MD  lisinopril (PRINIVIL,ZESTRIL) 40 MG tablet Take 1 tablet (40 mg total) by mouth daily. 01/01/16  Yes Renee A Kuneff, DO  omeprazole (PRILOSEC) 40 MG capsule Take 1 capsule (40 mg total) by mouth daily. 11/26/16  Yes Renee A Kuneff, DO  amoxicillin-clavulanate (AUGMENTIN) 875-125 MG tablet Take 1 tablet by mouth 2 (two) times daily. Patient not taking: Reported on 02/28/2017 01/25/17   Renee A Kuneff, DO  cyclobenzaprine (FLEXERIL) 10 MG tablet Take 1 tablet (10 mg total) by mouth 2 (two) times daily as needed for muscle spasms. Patient not taking: Reported on 02/28/2017 10/26/16   Courteney Lyn Mackuen, MD  doxycycline (VIBRAMYCIN) 100 MG capsule Take 1 capsule (100 mg total) by mouth 2 (two) times daily. Patient not taking: Reported on 02/28/2017 01/15/17   Gloriann Loan, PA-C  glucose blood (ONETOUCH VERIO) test strip Test 4 times daily. Patient taking differently: 1 each by Other route 3 (three) times daily.  07/17/15   Irene Pap, NP  linaclotide Rolan Lipa) 290 MCG CAPS capsule Take 1 capsule (290 mcg total) by mouth daily before breakfast. Patient not taking: Reported on 02/28/2017 11/25/16   Renee A Kuneff, DO  metoCLOPramide (REGLAN) 5 MG tablet Take 1 tablet (5 mg total) by mouth 2 (two) times daily. 1 tab BID for 5 days Patient not taking: Reported on 02/28/2017 12/24/16   Renee A Kuneff, DO  metroNIDAZOLE (FLAGYL) 500 MG tablet Take 1 tablet (500 mg total) by mouth 2 (two) times daily. 02/28/17   Ozella Almond Ward, PA-C    oxyCODONE-acetaminophen (PERCOCET/ROXICET) 5-325 MG tablet Take 1 tablet by mouth every 4 (four) hours as needed for severe pain. Patient not taking: Reported on 02/28/2017 01/15/17   Gloriann Loan, PA-C  senna-docusate (SENOKOT-S) 8.6-50 MG tablet Take 2 tablets by mouth daily. Patient not taking: Reported on 02/28/2017 11/25/16   Ma Hillock, DO    Family History Family History  Problem Relation Age of Onset  . Colon polyps Mother   . Diabetes Mother   . Heart murmur Father   . Hypertension Father   . Diabetes Sister   . Kidney failure Sister   . Diabetes Brother   . Retinal degeneration Brother   . Heart murmur Son   . Stroke Paternal Grandmother   . Diabetes Sister  Social History Social History  Substance Use Topics  . Smoking status: Never Smoker  . Smokeless tobacco: Never Used  . Alcohol use No     Allergies   Ibuprofen and Dilaudid [hydromorphone hcl]   Review of Systems Review of Systems  Constitutional: Negative for chills and fever.  HENT: Negative for congestion.   Eyes: Negative for visual disturbance.  Respiratory: Negative for cough and shortness of breath.   Cardiovascular: Negative for chest pain.  Gastrointestinal: Negative for abdominal pain, constipation, diarrhea, nausea and vomiting.  Genitourinary: Positive for pelvic pain and vaginal discharge. Negative for dysuria, frequency and urgency.  Musculoskeletal: Positive for back pain. Negative for neck pain.  Skin: Negative for rash.  Neurological: Negative for headaches.     Physical Exam Updated Vital Signs BP (!) 202/95   Pulse 85   Temp 98.3 F (36.8 C) (Oral)   Resp 20   Ht 5\' 8"  (1.727 m)   Wt 108.4 kg   LMP 02/10/2017   SpO2 100%   BMI 36.34 kg/m   Physical Exam  Constitutional: [name] oriented to person, place, and time. She appears well-developed and well-nourished. No distress.  HENT:  Head: Normocephalic and atraumatic.  Cardiovascular: Normal rate, regular rhythm and  normal heart sounds.   No murmur heard. Pulmonary/Chest: Effort normal and breath sounds normal. No respiratory distress.  Abdominal: Soft. Bowel sounds are normal. She exhibits no distension.  No abdominal or CVA tenderness.  Genitourinary:  Genitourinary Comments: Chaperone present for exam. + vaginal discharge. No adnexal masses, tenderness or fullness. No cervical motion tenderness. No bleeding within the vaginal vault.  Neurological: [name] alert and oriented to person, place, and time.  Skin: Skin is warm and dry.  Nursing note and vitals reviewed.    ED Treatments / Results  Labs (all labs ordered are listed, but only abnormal results are displayed) Labs Reviewed  WET PREP, GENITAL - Abnormal; Notable for the following:       Result Value   Clue Cells Wet Prep HPF POC PRESENT (*)    WBC, Wet Prep HPF POC FEW (*)    All other components within normal limits  URINALYSIS, ROUTINE W REFLEX MICROSCOPIC - Abnormal; Notable for the following:    Glucose, UA >=500 (*)    Hgb urine dipstick SMALL (*)    Protein, ur >=300 (*)    Bacteria, UA RARE (*)    Squamous Epithelial / LPF 0-5 (*)    All other components within normal limits  I-STAT CHEM 8, ED - Abnormal; Notable for the following:    BUN 41 (*)    Creatinine, Ser 3.30 (*)    Glucose, Bld 185 (*)    All other components within normal limits  URINE CULTURE  GC/CHLAMYDIA PROBE AMP ([place]) NOT AT [facility]    EKG  EKG Interpretation None       Radiology No results found.  Procedures Procedures (including critical care time)  Medications Ordered in ED Medications  HYDROcodone-acetaminophen (NORCO/VICODIN) 5-325 MG per tablet 1 tablet (1 tablet Oral Given 02/28/17 2135)  amLODipine (NORVASC) tablet 10 mg (10 mg Oral Given 02/28/17 2135)     Initial Impression / Assessment and Plan / ED Course  I have reviewed the triage vital signs and the nursing notes.  Pertinent labs & imaging results that were available  during my care of the patient were reviewed by me and considered in my medical decision making (see chart for details).  Heather [name] is a 46 y.o. female who presents to ED for vaginal discharge and pelvic pain x1-2 days. On exam, patient with vaginal discharge, no adnexal or cervical motion tenderness. Wet prep shows clue cells and few white blood cells. Will treat BV with Flagyl. Urine does not show signs of infection. She does have greater than 500 glucose and greater than 300 protein in the urine. No ketones. Blood pressure upon arrival was 191/92. Patient states that she took her morning lisinopril this morning. She has not taken her nightly blood pressure medication, amlodipine. Amlodipine given in ED. HTN asymptomatic. Patient was seen by her primary care provider on 2/06 and chart was reviewed from this encounter. Per chart review, patient appears to be noncompliant with medication regimens. BP at PCP was 180/100. Hx of CKD with creatinine of 3.35 at last visit. Baseline 3-3.4. Chem8 at baseline. Spoke at length of the importance of BP control and taking medications as directed. Evaluation does not show pathology that would require ongoing emergent intervention or inpatient treatment. Patient strongly encouraged to follow up with PCP for further discussion of hypertension. Reasons to return to ER discussed and all questions answered.   Final Clinical Impressions(s) / ED Diagnoses   Final diagnoses:  BV (bacterial vaginosis)  Essential hypertension    New Prescriptions New Prescriptions   METRONIDAZOLE (FLAGYL) 500 MG TABLET    Take 1 tablet (500 mg total) by mouth 2 (two) times daily.     [facility] Ward, PA-C 02/28/17 [place], MD 02/28/17 2300

## 2017-02-28 NOTE — ED Triage Notes (Signed)
Patient c/o pelvic pain and lower back pain with white discharge since yesterday. Denies abdominal pain, urinary sx, N/V/D. Ambulatory to triage.

## 2017-03-01 LAB — GC/CHLAMYDIA PROBE AMP (~~LOC~~) NOT AT ARMC
Chlamydia: NEGATIVE
NEISSERIA GONORRHEA: NEGATIVE

## 2017-03-02 LAB — URINE CULTURE: Culture: 50000 — AB

## 2017-03-03 ENCOUNTER — Other Ambulatory Visit (HOSPITAL_COMMUNITY): Payer: Self-pay | Admitting: *Deleted

## 2017-03-03 DIAGNOSIS — E11621 Type 2 diabetes mellitus with foot ulcer: Secondary | ICD-10-CM | POA: Diagnosis not present

## 2017-03-04 ENCOUNTER — Ambulatory Visit (HOSPITAL_COMMUNITY)
Admission: RE | Admit: 2017-03-04 | Discharge: 2017-03-04 | Disposition: A | Discharge status: Home / Self Care | Payer: Managed Care, Other (non HMO) | Source: Ambulatory Visit | Attending: Nephrology | Admitting: Nephrology

## 2017-03-04 DIAGNOSIS — N182 Chronic kidney disease, stage 2 (mild): Secondary | ICD-10-CM

## 2017-03-04 DIAGNOSIS — D631 Anemia in chronic kidney disease: Secondary | ICD-10-CM | POA: Diagnosis present

## 2017-03-04 MED ORDER — EPOETIN ALFA 40000 UNIT/ML IJ SOLN
INTRAMUSCULAR | Status: AC
  Filled 2017-03-04: qty 1

## 2017-03-04 MED ORDER — EPOETIN ALFA 40000 UNIT/ML IJ SOLN
40000.0000 [IU] | INTRAMUSCULAR | Status: DC
  Administered 2017-03-04: 40000 [IU] via SUBCUTANEOUS

## 2017-03-04 MED ORDER — SODIUM CHLORIDE 0.9 % IV SOLN
510.0000 mg | Freq: Once | INTRAVENOUS | Status: AC
  Administered 2017-03-04: 510 mg via INTRAVENOUS
  Filled 2017-03-04: qty 17

## 2017-03-08 LAB — POCT HEMOGLOBIN-HEMACUE: Hemoglobin: 11.1 g/dL — ABNORMAL LOW (ref 12.0–15.0)

## 2017-03-10 DIAGNOSIS — E11621 Type 2 diabetes mellitus with foot ulcer: Secondary | ICD-10-CM | POA: Diagnosis not present

## 2017-03-17 DIAGNOSIS — E11621 Type 2 diabetes mellitus with foot ulcer: Secondary | ICD-10-CM | POA: Diagnosis not present

## 2017-03-18 ENCOUNTER — Encounter (HOSPITAL_COMMUNITY)
Admission: RE | Admit: 2017-03-18 | Discharge: 2017-03-18 | Disposition: A | Discharge status: Home / Self Care | Payer: Managed Care, Other (non HMO) | Source: Ambulatory Visit | Attending: Nephrology | Admitting: Nephrology

## 2017-03-18 ENCOUNTER — Telehealth: Payer: Self-pay | Admitting: *Deleted

## 2017-03-18 DIAGNOSIS — N182 Chronic kidney disease, stage 2 (mild): Secondary | ICD-10-CM | POA: Insufficient documentation

## 2017-03-18 LAB — FERRITIN: Ferritin: 940 ng/mL — ABNORMAL HIGH (ref 11–307)

## 2017-03-18 LAB — IRON AND TIBC
IRON: 65 ug/dL (ref 28–170)
SATURATION RATIOS: 32 % — AB (ref 10.4–31.8)
TIBC: 202 ug/dL — AB (ref 250–450)
UIBC: 137 ug/dL

## 2017-03-18 LAB — POCT HEMOGLOBIN-HEMACUE: Hemoglobin: 11.1 g/dL — ABNORMAL LOW (ref 12.0–15.0)

## 2017-03-18 MED ORDER — EPOETIN ALFA 40000 UNIT/ML IJ SOLN
INTRAMUSCULAR | Status: AC
  Administered 2017-03-18: 08:00:00 40000 [IU] via SUBCUTANEOUS
  Filled 2017-03-18: qty 1

## 2017-03-18 MED ORDER — EPOETIN ALFA 40000 UNIT/ML IJ SOLN: 40000.0000 [IU] | INTRAMUSCULAR | Status: DC

## 2017-03-18 NOTE — Telephone Encounter (Signed)
Medication list updated for changes made at [facility].

## 2017-03-24 ENCOUNTER — Encounter: Payer: Self-pay | Admitting: Family Medicine

## 2017-03-24 ENCOUNTER — Ambulatory Visit (INDEPENDENT_AMBULATORY_CARE_PROVIDER_SITE_OTHER): Payer: Managed Care, Other (non HMO)

## 2017-03-24 ENCOUNTER — Ambulatory Visit (INDEPENDENT_AMBULATORY_CARE_PROVIDER_SITE_OTHER): Payer: Managed Care, Other (non HMO) | Admitting: Family Medicine

## 2017-03-24 ENCOUNTER — Telehealth: Payer: Self-pay | Admitting: Family Medicine

## 2017-03-24 VITALS — BP 158/84 | HR 93 | Temp 98.3°F | Wt 239.8 lb

## 2017-03-24 DIAGNOSIS — E119 Type 2 diabetes mellitus without complications: Secondary | ICD-10-CM | POA: Diagnosis not present

## 2017-03-24 DIAGNOSIS — R05 Cough: Secondary | ICD-10-CM

## 2017-03-24 DIAGNOSIS — R0602 Shortness of breath: Secondary | ICD-10-CM

## 2017-03-24 DIAGNOSIS — Z794 Long term (current) use of insulin: Secondary | ICD-10-CM

## 2017-03-24 DIAGNOSIS — R059 Cough, unspecified: Secondary | ICD-10-CM

## 2017-03-24 MED ORDER — DOXYCYCLINE HYCLATE 100 MG PO TABS: 100.0000 mg | ORAL_TABLET | Freq: Two times a day (BID) | ORAL | 0 refills | Status: DC

## 2017-03-24 MED ORDER — HYDROCODONE-HOMATROPINE 5-1.5 MG/5ML PO SYRP: 5.0000 mL | ORAL_SOLUTION | Freq: Every evening | ORAL | 0 refills | Status: DC | PRN

## 2017-03-24 MED ORDER — ALBUTEROL SULFATE HFA 108 (90 BASE) MCG/ACT IN AERS: 2.0000 | INHALATION_SPRAY | Freq: Four times a day (QID) | RESPIRATORY_TRACT | 2 refills | Status: DC | PRN

## 2017-03-24 NOTE — Telephone Encounter (Signed)
Okay to switch?  

## 2017-03-24 NOTE — Progress Notes (Signed)
Heather [name] is a 46 y.o. female here for a new problem.  History of Present Illness:   Shaune Pascal CMA acting as scribe for Dr. Juleen China  Chief Complaint  Patient presents with  . Cough    X 1 week   . Headache    X 1 week    Cough  This is a new problem. The current episode started in the past 7 days. The problem has been gradually worsening. The cough is non-productive. Associated symptoms include chills, ear pain, a fever, headaches, myalgias, nasal congestion, a sore throat, shortness of breath and wheezing. Pertinent negatives include no chest pain, hemoptysis or rash. The symptoms are aggravated by lying down and exercise. She has tried OTC cough suppressant for the symptoms. The treatment provided mild relief. Her past medical history is significant for pneumonia.   PMHx, SurgHx, SocialHx, Medications, and Allergies were reviewed in the Visit Navigator and updated as appropriate.  Current Medications:   Current Outpatient Prescriptions:  .  acetaminophen (TYLENOL) 500 MG tablet, Take 1,000 mg by mouth every 6 (six) hours as needed., Disp: , Rfl:  .  calcitRIOL (ROCALTROL) 0.25 MCG capsule, Take 0.5 mcg by mouth daily. , Disp: , Rfl:  .  cyclobenzaprine (FLEXERIL) 10 MG tablet, Take 1 tablet (10 mg total) by mouth 2 (two) times daily as needed for muscle spasms., Disp: 10 tablet, Rfl: 0 .  epoetin alfa (EPOGEN,PROCRIT) 96222 UNIT/ML injection, Inject 30,000 Units into the vein every 14 (fourteen) days. , Disp: , Rfl:  .  furosemide (LASIX) 80 MG tablet, Take 1 tablet (80 mg total) by mouth 2 (two) times daily. (Patient taking differently: Take 160 mg by mouth 3 (three) times daily. ), Disp: 60 tablet, Rfl: 0 .  gabapentin (NEURONTIN) 300 MG capsule, Take 1 capsule (300 mg total) by mouth 2 (two) times daily as needed. (Patient taking differently: Take 900 mg by mouth daily. ), Disp: 60 capsule, Rfl: 3 .  glucose blood (ONETOUCH VERIO) test strip, Test 4 times daily. (Patient  taking differently: 1 each by Other route 3 (three) times daily. ), Disp: 100 each, Rfl: 12 .  insulin aspart (NOVOLOG) 100 UNIT/ML FlexPen, Inject 10-15 Units into the skin See admin instructions. Inject 10 units subque in the morning and inject 15 units subqu at lunch and dinner, Disp: , Rfl:  .  insulin degludec (TRESIBA FLEXTOUCH) 100 UNIT/ML SOPN FlexTouch Pen, Inject 26 Units into the skin daily with breakfast. , Disp: , Rfl:  .  linaclotide (LINZESS) 290 MCG CAPS capsule, Take 1 capsule (290 mcg total) by mouth daily before breakfast., Disp: 30 capsule, Rfl: 11 .  lisinopril (PRINIVIL,ZESTRIL) 20 MG tablet, Take 20 mg by mouth 2 (two) times daily., Disp: , Rfl:  .  metoCLOPramide (REGLAN) 5 MG tablet, Take 1 tablet (5 mg total) by mouth 2 (two) times daily. 1 tab BID for 5 days, Disp: 180 tablet, Rfl: 0 .  omeprazole (PRILOSEC) 40 MG capsule, Take 1 capsule (40 mg total) by mouth daily., Disp: 30 capsule, Rfl: 3 .  senna-docusate (SENOKOT-S) 8.6-50 MG tablet, Take 2 tablets by mouth daily., Disp: 60 tablet, Rfl: 5   Review of Systems:   Review of Systems  Constitutional: Positive for chills, fever and malaise/fatigue.       Tmax 100.2.  HENT: Positive for ear pain and sore throat. Negative for congestion and sinus pain.        Left ear pain.  Eyes: Negative for blurred vision and  double vision.  Respiratory: Positive for cough, sputum production, shortness of breath and wheezing. Negative for hemoptysis.        Cough for 1 week, worse with walking.  Cardiovascular: Negative for chest pain, palpitations and leg swelling.  Gastrointestinal: Negative for abdominal pain, constipation, diarrhea and vomiting.  Genitourinary: Negative for dysuria.  Musculoskeletal: Positive for myalgias. Negative for back pain, joint pain and neck pain.  Skin: Negative for rash.  Neurological: Positive for headaches. Negative for dizziness, speech change and focal weakness.       For 1 week.   Psychiatric/Behavioral: Negative for depression, hallucinations and memory loss.    Vitals:   Vitals:   03/24/17 1503  BP: (!) 158/84  Pulse: 93  Temp: 98.3 F (36.8 C)  TempSrc: Oral  SpO2: 98%  Weight: 239 lb 12.8 oz (108.8 kg)     Body mass index is 36.46 kg/m.  Physical Exam:   Physical Exam  Constitutional: She appears well-nourished.  HENT:  Head: Normocephalic and atraumatic.  Eyes: EOM are normal. Pupils are equal, round, and reactive to light.  Neck: Normal range of motion. Neck supple.  Cardiovascular: Normal rate, regular rhythm, normal heart sounds and intact distal pulses.   Pulmonary/Chest: Effort normal. No respiratory distress. She has wheezes.  Abdominal: Soft.  Skin: Skin is warm.  Psychiatric: She has a normal mood and affect. Her behavior is normal.  Nursing note and vitals reviewed.   Assessment and Plan:    Leveta was seen today for cough and headache.  Diagnoses and all orders for this visit:  Cough Comments: Bronchitis with concern for early PNA. Diffuse wheeze. No red flags. Symptomatic care and red flags reviewed.  Orders: -     DG Chest 2 View - no obvious disease -     doxycycline (VIBRA-TABS) 100 MG tablet; Take 1 tablet (100 mg total) by mouth 2 (two) times daily. -     HYDROcodone-homatropine (HYCODAN) 5-1.5 MG/5ML syrup; Take 5 mLs by mouth at bedtime as needed for cough. -     albuterol (PROVENTIL HFA;VENTOLIN HFA) 108 (90 Base) MCG/ACT inhaler; Inhale 2 puffs into the lungs every 6 (six) hours as needed for wheezing or shortness of breath.  SOB (shortness of breath) -     EKG 12-Lead - unchanged from previous EKG  Diabetes mellitus, type II, insulin dependent ([place]) Comments: Patient staying hydrated. BG 144 in office. DM sick care reviewed.   . Reviewed expectations re: course of current medical issues. . Discussed self-management of symptoms. . Outlined signs and symptoms indicating need for more acute  intervention. . Patient verbalized understanding and all questions were answered. . See orders for this visit as documented in the electronic medical record. . Patient received an After-Visit Summary.  CMA served as Education administrator during this visit. History, Physical, and Plan performed by medical provider. Documentation and orders reviewed and attested to. Briscoe Deutscher, D.O.  Briscoe Deutscher, D.O.

## 2017-03-24 NOTE — Telephone Encounter (Signed)
Patient wants to switch PCP. Just wanted to check with both and make sure it is okay. Will contact patient after.

## 2017-03-28 NOTE — Telephone Encounter (Signed)
Okay with me 

## 2017-04-01 ENCOUNTER — Encounter (HOSPITAL_COMMUNITY)
Admission: RE | Admit: 2017-04-01 | Discharge: 2017-04-01 | Disposition: A | Discharge status: Home / Self Care | Payer: Managed Care, Other (non HMO) | Source: Ambulatory Visit | Attending: Nephrology | Admitting: Nephrology

## 2017-04-01 DIAGNOSIS — N182 Chronic kidney disease, stage 2 (mild): Secondary | ICD-10-CM | POA: Diagnosis present

## 2017-04-01 MED ORDER — EPOETIN ALFA 40000 UNIT/ML IJ SOLN: 40000.0000 [IU] | INTRAMUSCULAR | Status: DC

## 2017-04-04 LAB — POCT HEMOGLOBIN-HEMACUE: Hemoglobin: 13.2 g/dL (ref 12.0–15.0)

## 2017-04-12 ENCOUNTER — Ambulatory Visit (INDEPENDENT_AMBULATORY_CARE_PROVIDER_SITE_OTHER): Payer: Managed Care, Other (non HMO) | Admitting: Family Medicine

## 2017-04-12 ENCOUNTER — Encounter: Payer: Self-pay | Admitting: Family Medicine

## 2017-04-12 VITALS — BP 126/78 | HR 87 | Temp 98.3°F | Ht 69.0 in | Wt 240.8 lb

## 2017-04-12 DIAGNOSIS — Z794 Long term (current) use of insulin: Secondary | ICD-10-CM

## 2017-04-12 DIAGNOSIS — E1122 Type 2 diabetes mellitus with diabetic chronic kidney disease: Secondary | ICD-10-CM | POA: Diagnosis not present

## 2017-04-12 DIAGNOSIS — IMO0002 Reserved for concepts with insufficient information to code with codable children: Secondary | ICD-10-CM

## 2017-04-12 DIAGNOSIS — J301 Allergic rhinitis due to pollen: Secondary | ICD-10-CM | POA: Diagnosis not present

## 2017-04-12 DIAGNOSIS — R058 Other specified cough: Secondary | ICD-10-CM

## 2017-04-12 DIAGNOSIS — N184 Chronic kidney disease, stage 4 (severe): Secondary | ICD-10-CM

## 2017-04-12 DIAGNOSIS — R05 Cough: Secondary | ICD-10-CM | POA: Diagnosis not present

## 2017-04-12 DIAGNOSIS — E1165 Type 2 diabetes mellitus with hyperglycemia: Secondary | ICD-10-CM

## 2017-04-12 MED ORDER — CETIRIZINE HCL 5 MG PO TABS: ORAL_TABLET | ORAL | 0 refills | Status: DC

## 2017-04-12 MED ORDER — FLUTICASONE PROPIONATE 50 MCG/ACT NA SUSP: 2.0000 | Freq: Every day | NASAL | 6 refills | Status: DC

## 2017-04-12 NOTE — Progress Notes (Signed)
Pre visit review using our clinic review tool, if applicable. No additional management support is needed unless otherwise documented below in the visit note. 

## 2017-04-12 NOTE — Progress Notes (Signed)
Heather [name] is a 46 y.o. female is here to [facility].   History of Present Illness:   Water quality scientist, CMA, acting as scribe for Dr. Juleen China.  Cough  This is a recurrent problem. The current episode started 1 to 4 weeks ago. The problem has been unchanged. The problem occurs every few minutes. The cough is non-productive. Associated symptoms include headaches, nasal congestion and a sore throat. Pertinent negatives include no chest pain, ear pain, fever, rash or shortness of breath. Nothing aggravates the symptoms. She has tried nothing for the symptoms.   There are no preventive care reminders to display for this patient.  PMHx, SurgHx, SocialHx, Medications, and Allergies were reviewed in the Visit Navigator and updated as appropriate.   Past Medical History:  Diagnosis Date  . Allergy   . Anemia of chronic disease   . B12 deficiency 05/10/2014  . Blood transfusion without reported diagnosis   . Chronic constipation   . Chronic kidney disease   . Diabetic neuropathy, painful ([place]) 07/13/2013  . DM2 (diabetes mellitus, type 2) ([place])   . Fibromyalgia   . Gastroparesis due to DM   . Headache   . Heart murmur, systolic 13/01/4400  . Hyperlipemia   . Hypertension   . IBS (irritable bowel syndrome)   . Infertility   . Leiomyoma of uterus   . Morbid obesity due to excess calories ([place]) 11/16/2016  . Obesity   . Pancreatitis   . 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 Right-sided low back pain without sciatica 12/08/2015  . Secondary hyperthyroidism 11/27/2013  . Upper airway cough syndrome 11/15/2016   Past Surgical History:  Procedure Laterality Date  . BASCILIC VEIN TRANSPOSITION Left 08/31/2016  . FOOT SURGERY Bilateral   . MYOMECTOMY    . VITRECTOMY Bilateral    Family History  Problem Relation Age of Onset  . Colon polyps Mother   . Diabetes Mother   . Heart murmur Father   . Hypertension Father   . Diabetes Sister   . Kidney failure Sister   . Diabetes  Brother   . Retinal degeneration Brother   . Heart murmur Son   . Stroke Paternal Grandmother   . Diabetes Sister    Social History  Substance Use Topics  . Smoking status: Never Smoker  . Smokeless tobacco: Never Used  . Alcohol use No   Current Medications and Allergies:   .  acetaminophen (TYLENOL) 500 MG tablet, Take 1,000 mg by mouth every 6 (six) hours as needed., Disp: , Rfl:  .  albuterol (PROVENTIL HFA;VENTOLIN HFA) 108 (90 Base) MCG/ACT inhaler, Inhale 2 puffs into the lungs every 6 (six) hours as needed for wheezing or shortness of breath., Disp: 1 Inhaler, Rfl: 2 .  calcitRIOL (ROCALTROL) 0.25 MCG capsule, Take 0.5 mcg by mouth daily. , Disp: , Rfl:  .  cyclobenzaprine (FLEXERIL) 10 MG tablet, Take 1 tablet (10 mg total) by mouth 2 (two) times daily as needed for muscle spasms., Disp: 10 tablet, Rfl: 0 .  epoetin alfa (EPOGEN,PROCRIT) 02725 UNIT/ML injection, Inject 30,000 Units into the vein every 14 (fourteen) days. , Disp: , Rfl:  .  furosemide (LASIX) 80 MG tablet, Take 1 tablet (80 mg total) by mouth 2 (two) times daily. (Patient taking differently: Take 160 mg by mouth 3 (three) times daily. ), Disp: 60 tablet, Rfl: 0 .  gabapentin (NEURONTIN) 300 MG capsule, Take 1 capsule (300 mg total) by mouth 2 (two) times daily as  needed. (Patient taking differently: Take 900 mg by mouth daily. ), Disp: 60 capsule, Rfl: 3 .  glucose blood (ONETOUCH VERIO) test strip, Test 4 times daily. (Patient taking differently: 1 each by Other route 3 (three) times daily. ), Disp: 100 each, Rfl: 12 .  insulin aspart (NOVOLOG) 100 UNIT/ML FlexPen, Inject 10-15 Units into the skin See admin instructions. Inject 10 units subque in the morning and inject 15 units subqu at lunch and dinner, Disp: , Rfl:  .  insulin degludec (TRESIBA FLEXTOUCH) 100 UNIT/ML SOPN FlexTouch Pen, Inject 26 Units into the skin daily with breakfast. , Disp: , Rfl:  .  linaclotide (LINZESS) 290 MCG CAPS capsule, Take 1 capsule  (290 mcg total) by mouth daily before breakfast., Disp: 30 capsule, Rfl: 11 .  lisinopril (PRINIVIL,ZESTRIL) 20 MG tablet, Take 20 mg by mouth 2 (two) times daily., Disp: , Rfl:  .  metoCLOPramide (REGLAN) 5 MG tablet, Take 1 tablet (5 mg total) by mouth 2 (two) times daily. 1 tab BID for 5 days, Disp: 180 tablet, Rfl: 0 .  omeprazole (PRILOSEC) 40 MG capsule, Take 1 capsule (40 mg total) by mouth daily., Disp: 30 capsule, Rfl: 3 .  senna-docusate (SENOKOT-S) 8.6-50 MG tablet, Take 2 tablets by mouth daily., Disp: 60 tablet, Rfl: 5  Allergies  Allergen Reactions  . Ibuprofen Other (See Comments)  . Dilaudid [Hydromorphone Hcl] Itching and Other (See Comments)   Review of Systems:   Review of Systems  Constitutional: Negative for fever.  HENT: Positive for sore throat. Negative for ear pain.   Eyes: Negative for blurred vision.  Respiratory: Positive for cough. Negative for shortness of breath.   Cardiovascular: Negative for chest pain.  Gastrointestinal: Negative for abdominal pain, nausea and vomiting.  Genitourinary: Negative for frequency.  Musculoskeletal: Negative for back pain and neck pain.  Skin: Negative for rash.  Neurological: Positive for headaches. Negative for dizziness and loss of consciousness.    Vitals:   Vitals:   04/12/17 1007  BP: 126/78  Pulse: 87  Temp: 98.3 F (36.8 C)  TempSrc: Oral  SpO2: 98%  Weight: 240 lb 12.8 oz (109.2 kg)  Height: 5\' 9"  (1.753 m)     Body mass index is 35.56 kg/m.  Physical Exam:   Physical Exam  Constitutional: She appears well-nourished.  HENT:  Head: Normocephalic and atraumatic.  Eyes: EOM are normal. Pupils are equal, round, and reactive to light.  Neck: Normal range of motion. Neck supple.  Cardiovascular: Normal rate, regular rhythm, normal heart sounds and intact distal pulses.   Pulmonary/Chest: Effort normal.  Abdominal: Soft.  Skin: Skin is warm.  Psychiatric: She has a normal mood and affect. Her  behavior is normal.  Nursing note and vitals reviewed.    Assessment and Plan:    Gwendy was seen today for establish care, cough and eye itching.  Diagnoses and all orders for this visit:  Seasonal allergic rhinitis due to pollen Comments: Hx of UACS as below. This cough is different - associated with rhinits, PND, sinus pressure. No fever or red flags.  Orders: -     fluticasone (FLONASE) 50 MCG/ACT nasal spray; Place 2 sprays into both nostrils daily. -     cetirizine (ZYRTEC) 5 MG tablet; 1/2 po daily prn allergic rhinitis  Uncontrolled type 2 diabetes mellitus with stage 4 chronic kidney disease, with long-term current use of insulin ([facility]) Comments: Followed by Endocrinology and improving.  A1c = 7.5.   Upper airway cough syndrome Comments:  Previously evaluated by Dr. Melvyn Novas. Recommend Pepcid.  Chronic kidney disease, stage IV (severe) ([place]) Comments: Followed by [facility].    . Reviewed expectations re: course of current medical issues. . Discussed self-management of symptoms. . Outlined signs and symptoms indicating need for more acute intervention. . Patient verbalized understanding and all questions were answered. . See orders for this visit as documented in the electronic medical record. . Patient received an After Visit Summary.   Records requested if needed. I spent 30 minutes with this patient, greater than 50% was face-to-face time counseling regarding the above diagnoses.  CMA served as Education administrator during this visit. History, Physical, and Plan performed by medical provider. Documentation and orders reviewed and attested to. Briscoe Deutscher, D.O.  Briscoe Deutscher, [place], [place] 04/12/2017   Follow-up: No Follow-up on file.  Meds ordered this encounter  Medications  . fluticasone (FLONASE) 50 MCG/ACT nasal spray    Sig: Place 2 sprays into both nostrils daily.    Dispense:  16 g    Refill:  6  . cetirizine (ZYRTEC) 5 MG tablet    Sig:  1/2 po daily prn allergic rhinitis    Dispense:  24 tablet    Refill:  0   Medications Discontinued During This Encounter  Medication Reason  . doxycycline (VIBRA-TABS) 100 MG tablet Error  . HYDROcodone-homatropine (HYCODAN) 5-1.5 MG/5ML syrup Error   No orders of the defined types were placed in this encounter.

## 2017-04-12 NOTE — Patient Instructions (Signed)
Okay to make an appointment for a Physical with PAP.

## 2017-04-15 ENCOUNTER — Encounter (HOSPITAL_COMMUNITY)
Admission: RE | Admit: 2017-04-15 | Discharge: 2017-04-15 | Disposition: A | Discharge status: Home / Self Care | Payer: Managed Care, Other (non HMO) | Source: Ambulatory Visit | Attending: Nephrology | Admitting: Nephrology

## 2017-04-15 DIAGNOSIS — N182 Chronic kidney disease, stage 2 (mild): Secondary | ICD-10-CM | POA: Diagnosis not present

## 2017-04-15 LAB — POCT HEMOGLOBIN-HEMACUE: HEMOGLOBIN: 11.3 g/dL — AB (ref 12.0–15.0)

## 2017-04-15 MED ORDER — EPOETIN ALFA 40000 UNIT/ML IJ SOLN
40000.0000 [IU] | INTRAMUSCULAR | Status: DC
  Administered 2017-04-15: 40000 [IU] via SUBCUTANEOUS

## 2017-04-15 MED ORDER — EPOETIN ALFA 40000 UNIT/ML IJ SOLN
INTRAMUSCULAR | Status: AC
  Filled 2017-04-15: qty 1

## 2017-04-19 LAB — HEPATIC FUNCTION PANEL
ALK PHOS: 70 U/L (ref 25–125)
ALT: 27 U/L (ref 7–35)
AST: 17 U/L (ref 13–35)
Bilirubin, Total: 0.3 mg/dL

## 2017-04-19 LAB — HEMOGLOBIN A1C: Hemoglobin A1C: 7.4

## 2017-04-19 LAB — BASIC METABOLIC PANEL
BUN: 50 mg/dL — AB (ref 4–21)
CREATININE: 3.8 mg/dL — AB (ref <=1.1)
Glucose: 85 mg/dL
POTASSIUM: 4.2 mmol/L (ref 3.4–5.3)
SODIUM: 137 mmol/L (ref 137–147)

## 2017-04-21 ENCOUNTER — Encounter: Payer: Self-pay | Admitting: *Deleted

## 2017-04-21 ENCOUNTER — Telehealth: Payer: Self-pay | Admitting: Family Medicine

## 2017-04-21 NOTE — Telephone Encounter (Signed)
Ask more about the cough. How has it changed since she was last seen?

## 2017-04-21 NOTE — Telephone Encounter (Signed)
Patient called to request a different cough medicine due to the one prescribed at her last visit not working. Patient states she is still coughing and sniffling. Please call patient and advise today per patient's request at [phone].

## 2017-04-21 NOTE — Telephone Encounter (Signed)
Please advise.  On 03/24/17, patient received Rx for doxycycline, albuterol, and Hycodan.  On 04/12/17, received Rx for Flonase and Zyrtec.

## 2017-04-22 ENCOUNTER — Other Ambulatory Visit: Payer: Self-pay

## 2017-04-22 ENCOUNTER — Encounter: Payer: Self-pay | Admitting: Vascular Surgery

## 2017-04-22 ENCOUNTER — Ambulatory Visit (INDEPENDENT_AMBULATORY_CARE_PROVIDER_SITE_OTHER): Payer: Managed Care, Other (non HMO) | Admitting: Vascular Surgery

## 2017-04-22 VITALS — BP 132/75 | HR 87 | Temp 98.0°F | Resp 16 | Ht 69.0 in | Wt 235.0 lb

## 2017-04-22 DIAGNOSIS — R058 Other specified cough: Secondary | ICD-10-CM

## 2017-04-22 DIAGNOSIS — N185 Chronic kidney disease, stage 5: Secondary | ICD-10-CM

## 2017-04-22 DIAGNOSIS — R05 Cough: Secondary | ICD-10-CM

## 2017-04-22 NOTE — Telephone Encounter (Signed)
Left message informing patient of referral to pulmonology.

## 2017-04-22 NOTE — Telephone Encounter (Signed)
Patient states she feels she is coughing "harder".  Hurts her chest to cough.  Non-productive.  Cough occurs all day but is worse at night when lying down.  No fevers.  Doesn't feel that the medications have helped thus far.

## 2017-04-22 NOTE — Telephone Encounter (Signed)
Referral has been placed to Dr. Melvyn Novas at [facility].

## 2017-04-22 NOTE — Progress Notes (Addendum)
Established Dialysis Access   History of Present Illness   Heather [name] is a 46 y.o. (04-18-1971) female who presents for re-evaluation for permanent access.  The patient is right hand dominant.  Previous access procedures have been completed in the left arm.  The patient's complication from previous access procedures include: non-maturation.  The patient has never had a previous PPM placed.  The patient has no sx of CHF.  Pt notes Cr now chronically >4.  Past Medical History:  Diagnosis Date  . Allergy   . Anemia of chronic disease   . B12 deficiency 05/10/2014  . Blood transfusion without reported diagnosis   . Chronic constipation   . Chronic kidney disease   . Diabetic neuropathy, painful ([place]) 07/13/2013   Stop lyrica due to cost & SE of drowsy. Gabapentin helps Stay off hydrocodone: pt states she thinks "she was addicted" to med. "I don't want that anymore."   . DM2 (diabetes mellitus, type 2) ([place])   . Fibromyalgia   . Gastroparesis due to DM   . Headache   . Heart murmur, systolic 54/02/6066   Grade 1/6 ejection murmur per [facility] notes.   . Hyperlipemia   . Hypertension   . IBS (irritable bowel syndrome)   . Infertility   . Leiomyoma of uterus   . Morbid obesity due to excess calories ([place]) 11/16/2016  . Obesity   . Pancreatitis   . 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 Right-sided low back pain without sciatica 12/08/2015  . Secondary hyperthyroidism 11/27/2013   Followed by [facility], notes reviewed 01/01/15. PTH had improved with oral vitamin D: 133 to 85. Last 155-probably not taking vit D.    [name] Upper airway cough syndrome 11/15/2016   rec off acei/ on pepcid hs 11/15/2016 >>>     Past Surgical History:  Procedure Laterality Date  . BASCILIC VEIN TRANSPOSITION Left 08/31/2016   Procedure: BASILIC VEIN TRANSPOSITION FIRST STAGE;  Surgeon: Conrad Millfield, MD;  Location: [facility];  Service: Vascular;  Laterality: Left;  . FOOT SURGERY  Bilateral   . MYOMECTOMY    . VITRECTOMY Bilateral     Social History   Social History  . Marital status: Married    Spouse name: N/A  . Number of children: 1  . Years of education: college   Occupational History  . customer service rep [employer]   Social History Main Topics  . Smoking status: Never Smoker  . Smokeless tobacco: Never Used  . Alcohol use No  . Drug use: No  . Sexual activity: Yes    Partners: Male    Birth control/ protection: None   Other Topics Concern  . Not on file   Social History Narrative   Patient lives with fiance. She has 1 grown son. Works full-time, she Paramedic for attorney.   Caffeine Use: 2 cups daily; sodas occasionally    Family History  Problem Relation Age of Onset  . Colon polyps Mother   . Diabetes Mother   . Heart murmur Father   . Hypertension Father   . Diabetes Sister   . Kidney failure Sister   . Diabetes Brother   . Retinal degeneration Brother   . Heart murmur Son   . Stroke Paternal Grandmother   . Diabetes Sister     Current Outpatient Prescriptions  Medication Sig Dispense Refill  . acetaminophen (TYLENOL) 500 MG tablet Take 1,000 mg by mouth every 6 (  six) hours as needed.    [name] albuterol (PROVENTIL HFA;VENTOLIN HFA) 108 (90 Base) MCG/ACT inhaler Inhale 2 puffs into the lungs every 6 (six) hours as needed for wheezing or shortness of breath. 1 Inhaler 2  . calcitRIOL (ROCALTROL) 0.25 MCG capsule Take 0.5 mcg by mouth daily.     . cetirizine (ZYRTEC) 5 MG tablet 1/2 po daily prn allergic rhinitis 24 tablet 0  . cyclobenzaprine (FLEXERIL) 10 MG tablet Take 1 tablet (10 mg total) by mouth 2 (two) times daily as needed for muscle spasms. 10 tablet 0  . epoetin alfa (EPOGEN,PROCRIT) 02585 UNIT/ML injection Inject 30,000 Units into the vein every 14 (fourteen) days.     . fluticasone (FLONASE) 50 MCG/ACT nasal spray Place 2 sprays into both nostrils daily. 16 g 6  . furosemide (LASIX) 80 MG tablet Take 1  tablet (80 mg total) by mouth 2 (two) times daily. (Patient taking differently: Take 160 mg by mouth 3 (three) times daily. ) 60 tablet 0  . gabapentin (NEURONTIN) 300 MG capsule Take 1 capsule (300 mg total) by mouth 2 (two) times daily as needed. (Patient taking differently: Take 900 mg by mouth daily. ) 60 capsule 3  . glucose blood (ONETOUCH VERIO) test strip Test 4 times daily. (Patient taking differently: 1 each by Other route 3 (three) times daily. ) 100 each 12  . insulin aspart (NOVOLOG) 100 UNIT/ML FlexPen Inject 10-15 Units into the skin See admin instructions. Inject 10 units subque in the morning and inject 15 units subqu at lunch and dinner    . insulin degludec (TRESIBA FLEXTOUCH) 100 UNIT/ML SOPN FlexTouch Pen Inject 26 Units into the skin daily with breakfast.     . linaclotide (LINZESS) 290 MCG CAPS capsule Take 1 capsule (290 mcg total) by mouth daily before breakfast. 30 capsule 11  . lisinopril (PRINIVIL,ZESTRIL) 20 MG tablet Take 20 mg by mouth 2 (two) times daily.    . metoCLOPramide (REGLAN) 5 MG tablet Take 1 tablet (5 mg total) by mouth 2 (two) times daily. 1 tab BID for 5 days 180 tablet 0  . omeprazole (PRILOSEC) 40 MG capsule Take 1 capsule (40 mg total) by mouth daily. 30 capsule 3  . senna-docusate (SENOKOT-S) 8.6-50 MG tablet Take 2 tablets by mouth daily. 60 tablet 5   No current facility-administered medications for this visit.      Allergies  Allergen Reactions  . Ibuprofen Other (See Comments)    CKD stage 3. Should avoid.  . Dilaudid [Hydromorphone Hcl] Itching and Other (See Comments)    Can take with benadryl     REVIEW OF SYSTEMS:  (Positives checked otherwise negative)  CARDIOVASCULAR:   [ ]  chest pain,  [ ]  chest pressure,  [ ]  palpitations,  [ ]  shortness of breath when laying flat,  [ ]  shortness of breath with exertion,   [ ]  pain in feet when walking,  [ ]  pain in feet when laying flat, [ ]  history of blood clot in veins (DVT),  [ ]   history of phlebitis,  [ ]  swelling in legs,  [ ]  varicose veins  PULMONARY:   [ ]  productive cough,  [ ]  asthma,  [ ]  wheezing  NEUROLOGIC:   [ ]  weakness in arms or legs,  [ ]  numbness in arms or legs,  [ ]  difficulty speaking or slurred speech,  [ ]  temporary loss of vision in one eye,  [ ]  dizziness  HEMATOLOGIC:   [ ]  bleeding problems,  [ ]   problems with blood clotting too easily  MUSCULOSKEL:   [ ]  joint pain, [ ]  joint swelling  GASTROINTEST:   [ ]  vomiting blood,  [ ]  blood in stool     GENITOURINARY:   [ ]  burning with urination,  [ ]  blood in urine [x]  CKD Stage V  PSYCHIATRIC:   [ ]  history of major depression  INTEGUMENTARY:   [ ]  rashes,  [ ]  ulcers  CONSTITUTIONAL:   [ ]  fever,  [ ]  chills   Physical Examination   Vitals:   04/22/17 1135  BP: 132/75  Pulse: 87  Resp: 16  Temp: 98 F (36.7 C)  SpO2: 98%  Weight: 235 lb (106.6 kg)  Height: 5\' 9"  (1.753 m)    Body mass index is 34.7 kg/m.   General: Alert, O x 3, WD,NAD  Pulmonary: Sym exp, good B air movt, CTA B  Cardiac: RRR, Nl S1, S2, no Murmurs, No rubs, No S3,S4  Vascular: Vessel Right Left  Radial Palpable Palpable  Brachial Palpable Palpable  Carotid Palpable, No Bruit Palpable, No Bruit  Aorta Not palpable N/A  Femoral Palpable Palpable  Popliteal Not palpable Not palpable  PT Palpable Palpable  DP Palpable Palpable   Gastrointestinal: soft, non-distended, non-tender to palpation, No guarding or rebound, no HSM, no masses, no CVAT B, No palpable prominent aortic pulse,    Musculoskeletal: M/S 5/5 throughout  , Extremities without ischemic changes  , No edema present,  , No LDS present, faintly palpable thrill L antecubitum, +bruit  Neurologic: Pain and light touch intact in extremities , Motor exam as listed above   Medical Decision Making   Heather [name] is a 46 y.o. female who presents with nearing ESRD    Pt has now committed to HD, so will  offer her a LUA AVG with Artegraft given her youth and poor durability of PTFE in young patients.  Will ligate L 1st stage BRVT in the process of placing the Artegraft.  I had an extensive discussion with this patient in regards to the nature of access surgery, including risk, benefits, and alternatives.    The patient is aware that the risks of access surgery include but are not limited to: bleeding, infection, steal syndrome, nerve damage, ischemic monomelic neuropathy, failure of access to mature, and possible need for additional access procedures in the future.  The patient has agreed to proceed with the above procedure which will be scheduled 14 MAY 18.   Adele Barthel, MD, FACS [facility] Office: [phone] Pager: [phone]

## 2017-04-22 NOTE — Telephone Encounter (Signed)
Let's see how quickly we can get her into Pulmonology - it looks like she has been followed by them in the past for cough. If her cough is not productive, with wheeze, or CP - we have a few days.

## 2017-04-28 ENCOUNTER — Other Ambulatory Visit (HOSPITAL_COMMUNITY): Payer: Self-pay | Admitting: *Deleted

## 2017-04-29 ENCOUNTER — Encounter (HOSPITAL_COMMUNITY)
Admission: RE | Admit: 2017-04-29 | Discharge: 2017-04-29 | Disposition: A | Discharge status: Home / Self Care | Payer: Managed Care, Other (non HMO) | Source: Ambulatory Visit | Attending: Nephrology | Admitting: Nephrology

## 2017-04-29 DIAGNOSIS — N182 Chronic kidney disease, stage 2 (mild): Secondary | ICD-10-CM | POA: Diagnosis present

## 2017-04-29 LAB — IRON AND TIBC
IRON: 52 ug/dL (ref 28–170)
Saturation Ratios: 24 % (ref 10.4–31.8)
TIBC: 214 ug/dL — AB (ref 250–450)
UIBC: 162 ug/dL

## 2017-04-29 LAB — POCT HEMOGLOBIN-HEMACUE: Hemoglobin: 11.3 g/dL — ABNORMAL LOW (ref 12.0–15.0)

## 2017-04-29 LAB — FERRITIN: FERRITIN: 601 ng/mL — AB (ref 11–307)

## 2017-04-29 MED ORDER — EPOETIN ALFA 40000 UNIT/ML IJ SOLN
INTRAMUSCULAR | Status: AC
  Administered 2017-04-29: 08:00:00 40000 [IU] via SUBCUTANEOUS
  Filled 2017-04-29: qty 1

## 2017-04-29 MED ORDER — EPOETIN ALFA 40000 UNIT/ML IJ SOLN
40000.0000 [IU] | INTRAMUSCULAR | Status: DC
  Administered 2017-04-29: 40000 [IU] via SUBCUTANEOUS

## 2017-05-06 ENCOUNTER — Encounter (HOSPITAL_COMMUNITY): Payer: Self-pay | Admitting: *Deleted

## 2017-05-06 NOTE — Progress Notes (Signed)
Heather [name] reports that CBG runs in the mid 100's.   I instructed patient to check CBG to check CBG and if it is less than 70 to treat it with 1/2 cup of clear juice like apple juice or cranberry juice.  Do not take [name] I instructed patient to recheck CBG in 15 minutes and if CBG is not greater than 70, to  Call [phone] (pre- op). If it is before pre-op opens to retreat as before and recheck CBG in 15 minutes. I told patient to make note of time that liquid is taken and amount, that surgical time may have to be adjusted.  If CBG > 70 Take 1/2 - 15 units of [place]. If CBG is greater ahan 220 take 1/2 of Humulog scheduled dose.  Heather [name] denies chest pain and does not see a cardiologist

## 2017-05-09 ENCOUNTER — Encounter (HOSPITAL_COMMUNITY)
Admission: RE | Disposition: A | Discharge status: Home / Self Care | Payer: Self-pay | Source: Ambulatory Visit | Attending: Vascular Surgery

## 2017-05-09 ENCOUNTER — Telehealth: Payer: Self-pay | Admitting: Vascular Surgery

## 2017-05-09 ENCOUNTER — Ambulatory Visit (HOSPITAL_COMMUNITY)
Admission: RE | Admit: 2017-05-09 | Discharge: 2017-05-09 | Disposition: A | Discharge status: Home / Self Care | Payer: Managed Care, Other (non HMO) | Source: Ambulatory Visit | Attending: Vascular Surgery | Admitting: Vascular Surgery

## 2017-05-09 ENCOUNTER — Encounter (HOSPITAL_COMMUNITY): Payer: Self-pay | Admitting: Certified Registered Nurse Anesthetist

## 2017-05-09 ENCOUNTER — Ambulatory Visit (HOSPITAL_COMMUNITY): Payer: Managed Care, Other (non HMO) | Admitting: Anesthesiology

## 2017-05-09 DIAGNOSIS — Z6834 Body mass index (BMI) 34.0-34.9, adult: Secondary | ICD-10-CM | POA: Diagnosis not present

## 2017-05-09 DIAGNOSIS — E1143 Type 2 diabetes mellitus with diabetic autonomic (poly)neuropathy: Secondary | ICD-10-CM | POA: Insufficient documentation

## 2017-05-09 DIAGNOSIS — E0590 Thyrotoxicosis, unspecified without thyrotoxic crisis or storm: Secondary | ICD-10-CM | POA: Diagnosis not present

## 2017-05-09 DIAGNOSIS — D631 Anemia in chronic kidney disease: Secondary | ICD-10-CM | POA: Insufficient documentation

## 2017-05-09 DIAGNOSIS — E785 Hyperlipidemia, unspecified: Secondary | ICD-10-CM | POA: Diagnosis not present

## 2017-05-09 DIAGNOSIS — N185 Chronic kidney disease, stage 5: Secondary | ICD-10-CM | POA: Diagnosis not present

## 2017-05-09 DIAGNOSIS — Z79899 Other long term (current) drug therapy: Secondary | ICD-10-CM | POA: Diagnosis not present

## 2017-05-09 DIAGNOSIS — E1122 Type 2 diabetes mellitus with diabetic chronic kidney disease: Secondary | ICD-10-CM | POA: Insufficient documentation

## 2017-05-09 DIAGNOSIS — N184 Chronic kidney disease, stage 4 (severe): Secondary | ICD-10-CM | POA: Diagnosis not present

## 2017-05-09 DIAGNOSIS — I120 Hypertensive chronic kidney disease with stage 5 chronic kidney disease or end stage renal disease: Secondary | ICD-10-CM | POA: Diagnosis present

## 2017-05-09 DIAGNOSIS — M797 Fibromyalgia: Secondary | ICD-10-CM | POA: Diagnosis not present

## 2017-05-09 DIAGNOSIS — E538 Deficiency of other specified B group vitamins: Secondary | ICD-10-CM | POA: Diagnosis not present

## 2017-05-09 DIAGNOSIS — Z794 Long term (current) use of insulin: Secondary | ICD-10-CM | POA: Insufficient documentation

## 2017-05-09 HISTORY — PX: AV FISTULA PLACEMENT: SHX1204

## 2017-05-09 HISTORY — DX: Gastro-esophageal reflux disease without esophagitis: K21.9

## 2017-05-09 HISTORY — PX: LIGATION OF ARTERIOVENOUS  FISTULA: SHX5948

## 2017-05-09 LAB — GLUCOSE, CAPILLARY
GLUCOSE-CAPILLARY: 151 mg/dL — AB (ref 65–99)
GLUCOSE-CAPILLARY: 172 mg/dL — AB (ref 65–99)

## 2017-05-09 LAB — I-STAT BETA HCG BLOOD, ED (NOT ORDERABLE): I-stat hCG, quantitative: <5 m[IU]/mL (ref <5)

## 2017-05-09 SURGERY — INSERTION OF ARTERIOVENOUS (AV) GORE-TEX GRAFT ARM
Anesthesia: General | Site: Arm Upper | Laterality: Left

## 2017-05-09 MED ORDER — 0.9 % SODIUM CHLORIDE (POUR BTL) OPTIME
TOPICAL | Status: DC | PRN
  Administered 2017-05-09: 1000 mL

## 2017-05-09 MED ORDER — PROTAMINE SULFATE 10 MG/ML IV SOLN
INTRAVENOUS | Status: AC
  Filled 2017-05-09: qty 5

## 2017-05-09 MED ORDER — HEPARIN SODIUM (PORCINE) 1000 UNIT/ML IJ SOLN
INTRAMUSCULAR | Status: AC
  Filled 2017-05-09: qty 1

## 2017-05-09 MED ORDER — PHENYLEPHRINE HCL 10 MG/ML IJ SOLN
INTRAVENOUS | Status: DC | PRN
  Administered 2017-05-09: 40 ug/min via INTRAVENOUS

## 2017-05-09 MED ORDER — SODIUM CHLORIDE 0.9 % IJ SOLN
INTRAMUSCULAR | Status: DC | PRN
  Administered 2017-05-09: 09:00:00

## 2017-05-09 MED ORDER — LIDOCAINE HCL 1 % IJ SOLN
INTRAMUSCULAR | Status: AC
  Filled 2017-05-09: qty 20

## 2017-05-09 MED ORDER — PROPOFOL 10 MG/ML IV BOLUS
INTRAVENOUS | Status: AC
  Filled 2017-05-09: qty 40

## 2017-05-09 MED ORDER — PAPAVERINE HCL 30 MG/ML IJ SOLN
INTRAMUSCULAR | Status: AC
  Filled 2017-05-09: qty 2

## 2017-05-09 MED ORDER — HEPARIN SODIUM (PORCINE) 1000 UNIT/ML IJ SOLN
INTRAMUSCULAR | Status: DC | PRN
  Administered 2017-05-09: 10000 [IU] via INTRAVENOUS

## 2017-05-09 MED ORDER — SODIUM CHLORIDE 0.9 % IV SOLN
INTRAVENOUS | Status: DC | PRN
  Administered 2017-05-09: 07:00:00 via INTRAVENOUS

## 2017-05-09 MED ORDER — SODIUM CHLORIDE 0.9 % IV SOLN
INTRAVENOUS | Status: DC | PRN
  Administered 2017-05-09: 09:00:00

## 2017-05-09 MED ORDER — FENTANYL CITRATE (PF) 100 MCG/2ML IJ SOLN
25.0000 ug | INTRAMUSCULAR | Status: DC | PRN
  Administered 2017-05-09 (×3): 25 ug via INTRAVENOUS

## 2017-05-09 MED ORDER — SODIUM CHLORIDE 0.9 % IV SOLN: INTRAVENOUS | Status: DC

## 2017-05-09 MED ORDER — LIDOCAINE HCL (CARDIAC) 20 MG/ML IV SOLN
INTRAVENOUS | Status: DC | PRN
  Administered 2017-05-09: 40 mg via INTRAVENOUS

## 2017-05-09 MED ORDER — SUCCINYLCHOLINE CHLORIDE 20 MG/ML IJ SOLN
INTRAMUSCULAR | Status: DC | PRN
  Administered 2017-05-09: 140 mg via INTRAVENOUS

## 2017-05-09 MED ORDER — DEXTROSE 5 % IV SOLN
1.5000 g | INTRAVENOUS | Status: AC
  Administered 2017-05-09: 1.5 g via INTRAVENOUS
  Filled 2017-05-09: qty 1.5

## 2017-05-09 MED ORDER — MIDAZOLAM HCL 2 MG/2ML IJ SOLN
INTRAMUSCULAR | Status: AC
  Filled 2017-05-09: qty 2

## 2017-05-09 MED ORDER — MIDAZOLAM HCL 5 MG/5ML IJ SOLN
INTRAMUSCULAR | Status: DC | PRN
  Administered 2017-05-09: 2 mg via INTRAVENOUS

## 2017-05-09 MED ORDER — FENTANYL CITRATE (PF) 250 MCG/5ML IJ SOLN
INTRAMUSCULAR | Status: AC
  Filled 2017-05-09: qty 5

## 2017-05-09 MED ORDER — FENTANYL CITRATE (PF) 100 MCG/2ML IJ SOLN
INTRAMUSCULAR | Status: DC | PRN
  Administered 2017-05-09: 25 ug via INTRAVENOUS
  Administered 2017-05-09: 50 ug via INTRAVENOUS
  Administered 2017-05-09: 25 ug via INTRAVENOUS

## 2017-05-09 MED ORDER — FENTANYL CITRATE (PF) 100 MCG/2ML IJ SOLN
INTRAMUSCULAR | Status: AC
  Administered 2017-05-09: 25 ug via INTRAVENOUS
  Filled 2017-05-09: qty 2

## 2017-05-09 MED ORDER — CHLORHEXIDINE GLUCONATE CLOTH 2 % EX PADS: 6.0000 | MEDICATED_PAD | Freq: Once | CUTANEOUS | Status: DC

## 2017-05-09 MED ORDER — ONDANSETRON HCL 4 MG/2ML IJ SOLN
INTRAMUSCULAR | Status: AC
  Administered 2017-05-09: 4 mg via INTRAVENOUS
  Filled 2017-05-09: qty 2

## 2017-05-09 MED ORDER — PROTAMINE SULFATE 10 MG/ML IV SOLN
INTRAVENOUS | Status: DC | PRN
  Administered 2017-05-09: 10 mg via INTRAVENOUS
  Administered 2017-05-09: 20 mg via INTRAVENOUS
  Administered 2017-05-09 (×3): 10 mg via INTRAVENOUS

## 2017-05-09 MED ORDER — ACETAMINOPHEN 10 MG/ML IV SOLN
1000.0000 mg | Freq: Once | INTRAVENOUS | Status: AC
  Administered 2017-05-09: 1000 mg via INTRAVENOUS

## 2017-05-09 MED ORDER — HEMOSTATIC AGENTS (NO CHARGE) OPTIME
TOPICAL | Status: DC | PRN
  Administered 2017-05-09: 1 via TOPICAL

## 2017-05-09 MED ORDER — ACETAMINOPHEN 10 MG/ML IV SOLN
INTRAVENOUS | Status: AC
  Administered 2017-05-09: 1000 mg via INTRAVENOUS
  Filled 2017-05-09: qty 100

## 2017-05-09 MED ORDER — OXYCODONE-ACETAMINOPHEN 5-325 MG PO TABS
1.0000 | ORAL_TABLET | Freq: Four times a day (QID) | ORAL | 0 refills | Status: DC | PRN
Start: 2017-05-09 — End: 2017-06-10

## 2017-05-09 MED ORDER — PROPOFOL 10 MG/ML IV BOLUS
INTRAVENOUS | Status: DC | PRN
  Administered 2017-05-09: 140 mg via INTRAVENOUS
  Administered 2017-05-09: 40 mg via INTRAVENOUS

## 2017-05-09 MED ORDER — ONDANSETRON HCL 4 MG/2ML IJ SOLN
INTRAMUSCULAR | Status: DC | PRN
  Administered 2017-05-09: 4 mg via INTRAVENOUS

## 2017-05-09 MED ORDER — ONDANSETRON HCL 4 MG/2ML IJ SOLN
4.0000 mg | Freq: Once | INTRAMUSCULAR | Status: AC | PRN
  Administered 2017-05-09: 4 mg via INTRAVENOUS

## 2017-05-09 SURGICAL SUPPLY — 42 items
ARMBAND PINK RESTRICT EXTREMIT (MISCELLANEOUS) ×4 IMPLANT
CANISTER SUCT 3000ML PPV (MISCELLANEOUS) ×2 IMPLANT
CLIP TI MEDIUM 6 (CLIP) ×2 IMPLANT
CLIP TI WIDE RED SMALL 6 (CLIP) ×2 IMPLANT
COVER PROBE W GEL 5X96 (DRAPES) ×2 IMPLANT
DECANTER SPIKE VIAL GLASS SM (MISCELLANEOUS) ×2 IMPLANT
DERMABOND ADVANCED (GAUZE/BANDAGES/DRESSINGS) ×1
DERMABOND ADVANCED .7 DNX12 (GAUZE/BANDAGES/DRESSINGS) ×1 IMPLANT
ELECT REM PT RETURN 9FT ADLT (ELECTROSURGICAL) ×2
ELECTRODE REM PT RTRN 9FT ADLT (ELECTROSURGICAL) ×1 IMPLANT
GLOVE BIO SURGEON STRL SZ 6.5 (GLOVE) ×4 IMPLANT
GLOVE BIO SURGEON STRL SZ7 (GLOVE) ×2 IMPLANT
GLOVE BIOGEL PI IND STRL 6.5 (GLOVE) ×4 IMPLANT
GLOVE BIOGEL PI IND STRL 7.0 (GLOVE) ×1 IMPLANT
GLOVE BIOGEL PI IND STRL 7.5 (GLOVE) ×1 IMPLANT
GLOVE BIOGEL PI INDICATOR 6.5 (GLOVE) ×4
GLOVE BIOGEL PI INDICATOR 7.0 (GLOVE) ×1
GLOVE BIOGEL PI INDICATOR 7.5 (GLOVE) ×1
GLOVE SURG SS PI 6.5 STRL IVOR (GLOVE) ×2 IMPLANT
GOWN STRL NON-REIN LRG LVL3 (GOWN DISPOSABLE) ×2 IMPLANT
GOWN STRL REUS W/ TWL LRG LVL3 (GOWN DISPOSABLE) ×4 IMPLANT
GOWN STRL REUS W/TWL LRG LVL3 (GOWN DISPOSABLE) ×4
GRAFT COLLAGEN VASCULAR 7X40 (Vascular Products) ×2 IMPLANT
HEMOSTAT SPONGE AVITENE ULTRA (HEMOSTASIS) IMPLANT
KIT BASIN OR (CUSTOM PROCEDURE TRAY) ×2 IMPLANT
KIT ROOM TURNOVER OR (KITS) ×2 IMPLANT
NEEDLE 18GX1X1/2 (RX/OR ONLY) (NEEDLE) ×4 IMPLANT
NS IRRIG 1000ML POUR BTL (IV SOLUTION) ×2 IMPLANT
PACK CV ACCESS (CUSTOM PROCEDURE TRAY) ×2 IMPLANT
PAD ARMBOARD 7.5X6 YLW CONV (MISCELLANEOUS) ×4 IMPLANT
SUT GORETEX 6.0 TT13 (SUTURE) IMPLANT
SUT MNCRL AB 4-0 PS2 18 (SUTURE) ×2 IMPLANT
SUT PROLENE 6 0 BV (SUTURE) ×8 IMPLANT
SUT PROLENE 7 0 BV 1 (SUTURE) IMPLANT
SUT SILK 2 0 FS (SUTURE) ×2 IMPLANT
SUT VIC AB 3-0 SH 27 (SUTURE) ×2
SUT VIC AB 3-0 SH 27X BRD (SUTURE) ×2 IMPLANT
SYR 10ML LL (SYRINGE) ×2 IMPLANT
SYR 5ML LL (SYRINGE) ×2 IMPLANT
SYR TOOMEY 50ML (SYRINGE) ×2 IMPLANT
UNDERPAD 30X30 (UNDERPADS AND DIAPERS) ×2 IMPLANT
WATER STERILE IRR 1000ML POUR (IV SOLUTION) ×2 IMPLANT

## 2017-05-09 NOTE — Telephone Encounter (Signed)
-----   Message from Mena Goes, RN sent at 05/09/2017 10:33 AM EDT ----- Regarding: 4 weeks   ----- Message ----- From: Conrad [place], MD Sent: 05/09/2017  10:27 AM To: [address]  ALEIGHYA MCANELLY [id number] 1971-06-10  PROCEDURE:   1.  Left upper arm arteriovenous graft (6 mm Artegraft) 2.  Ligation of left first stage brachial vein transposition   Follow-up: 4 weeks

## 2017-05-09 NOTE — Anesthesia Procedure Notes (Signed)
Procedure Name: Intubation Date/Time: 05/09/2017 8:37 AM Performed by: Linna Caprice, DAVID Pre-anesthesia Checklist: Patient identified, Emergency Drugs available, Suction available and Patient being monitored Patient Re-evaluated:Patient Re-evaluated prior to inductionOxygen Delivery Method: Circle System Utilized Preoxygenation: Pre-oxygenation with 100% oxygen Intubation Type: IV induction Ventilation: Oral airway inserted - appropriate to patient size and Two handed mask ventilation required Laryngoscope Size: Glidescope and 3 Grade View: Grade I Tube type: Oral Tube size: 7.0 mm Number of attempts: 1 Airway Equipment and Method: Stylet,  Oral airway and Video-laryngoscopy Placement Confirmation: ETT inserted through vocal cords under direct vision,  positive ETCO2 and breath sounds checked- equal and bilateral Tube secured with: Tape Dental Injury: Teeth and Oropharynx as per pre-operative assessment

## 2017-05-09 NOTE — OR Nursing (Signed)
Fentanyl 25 mcg IV wasted in the sink and witnessed by Vivia Birmingham RN.

## 2017-05-09 NOTE — Anesthesia Preprocedure Evaluation (Addendum)
Anesthesia Evaluation  Patient identified by MRN, date of birth, ID band Patient awake    Reviewed: Allergy & Precautions, NPO status , Patient's Chart, lab work & pertinent test results  Airway Mallampati: II  TM Distance: >3 FB Neck ROM: Full    Dental  (+) Teeth Intact, Dental Advisory Given   Pulmonary    breath sounds clear to auscultation       Cardiovascular hypertension,  Rhythm:Regular Rate:Normal     Neuro/Psych    GI/Hepatic   Endo/Other  diabetes  Renal/GU      Musculoskeletal   Abdominal   Peds  Hematology   Anesthesia Other Findings   Reproductive/Obstetrics                            Anesthesia Physical Anesthesia Plan  ASA: III  Anesthesia Plan: General   Post-op Pain Management:    Induction: Intravenous  Airway Management Planned: LMA  Additional Equipment:   Intra-op Plan:   Post-operative Plan:   Informed Consent: I have reviewed the patients History and Physical, chart, labs and discussed the procedure including the risks, benefits and alternatives for the proposed anesthesia with the patient or authorized representative who has indicated his/her understanding and acceptance.     Plan Discussed with:   Anesthesia Plan Comments:         Anesthesia Quick Evaluation

## 2017-05-09 NOTE — H&P (View-Only) (Signed)
Established Dialysis Access   History of Present Illness   Heather [name] is a 46 y.o. ([birth year]) female who presents for re-evaluation for permanent access.  The patient is right hand dominant.  Previous access procedures have been completed in the left arm.  The patient's complication from previous access procedures include: non-maturation.  The patient has never had a previous PPM placed.  The patient has no sx of CHF.  Pt notes Cr now chronically >4.  Past Medical History:  Diagnosis Date  . Allergy   . Anemia of chronic disease   . B12 deficiency 05/10/2014  . Blood transfusion without reported diagnosis   . Chronic constipation   . Chronic kidney disease   . Diabetic neuropathy, painful ([place]) 07/13/2013   Stop lyrica due to cost & SE of drowsy. Gabapentin helps Stay off hydrocodone: pt states she thinks "she was addicted" to med. "I don't want that anymore."   . DM2 (diabetes mellitus, type 2) ([place])   . Fibromyalgia   . Gastroparesis due to DM   . Headache   . Heart murmur, systolic 75/12/6999   Grade 1/6 ejection murmur per [facility] notes.   . Hyperlipemia   . Hypertension   . IBS (irritable bowel syndrome)   . Infertility   . Leiomyoma of uterus   . Morbid obesity due to excess calories ([place]) 11/16/2016  . Obesity   . Pancreatitis   . 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 Right-sided low back pain without sciatica 12/08/2015  . Secondary hyperthyroidism 11/27/2013   Followed by [facility], notes reviewed 01/01/15. PTH had improved with oral vitamin D: 133 to 85. Last 155-probably not taking vit D.    [name] Upper airway cough syndrome 11/15/2016   rec off acei/ on pepcid hs 11/15/2016 >>>     Past Surgical History:  Procedure Laterality Date  . BASCILIC VEIN TRANSPOSITION Left 08/31/2016   Procedure: BASILIC VEIN TRANSPOSITION FIRST STAGE;  Surgeon: Conrad [place], MD;  Location: [facility];  Service: Vascular;  Laterality: Left;  . FOOT SURGERY  Bilateral   . MYOMECTOMY    . VITRECTOMY Bilateral     Social History   Social History  . Marital status: Married    Spouse name: N/A  . Number of children: 1  . Years of education: college   Occupational History  . customer service rep [employer]   Social History Main Topics  . Smoking status: Never Smoker  . Smokeless tobacco: Never Used  . Alcohol use No  . Drug use: No  . Sexual activity: Yes    Partners: Male    Birth control/ protection: None   Other Topics Concern  . Not on file   Social History Narrative   Patient lives with fiance. She has 1 grown son. Works full-time, she Paramedic for attorney.   Caffeine Use: 2 cups daily; sodas occasionally    Family History  Problem Relation Age of Onset  . Colon polyps Mother   . Diabetes Mother   . Heart murmur Father   . Hypertension Father   . Diabetes Sister   . Kidney failure Sister   . Diabetes Brother   . Retinal degeneration Brother   . Heart murmur Son   . Stroke Paternal Grandmother   . Diabetes Sister     Current Outpatient Prescriptions  Medication Sig Dispense Refill  . acetaminophen (TYLENOL) 500 MG tablet Take 1,000 mg by mouth every 6 (  six) hours as needed.    [name] albuterol (PROVENTIL HFA;VENTOLIN HFA) 108 (90 Base) MCG/ACT inhaler Inhale 2 puffs into the lungs every 6 (six) hours as needed for wheezing or shortness of breath. 1 Inhaler 2  . calcitRIOL (ROCALTROL) 0.25 MCG capsule Take 0.5 mcg by mouth daily.     . cetirizine (ZYRTEC) 5 MG tablet 1/2 po daily prn allergic rhinitis 24 tablet 0  . cyclobenzaprine (FLEXERIL) 10 MG tablet Take 1 tablet (10 mg total) by mouth 2 (two) times daily as needed for muscle spasms. 10 tablet 0  . epoetin alfa (EPOGEN,PROCRIT) 27062 UNIT/ML injection Inject 30,000 Units into the vein every 14 (fourteen) days.     . fluticasone (FLONASE) 50 MCG/ACT nasal spray Place 2 sprays into both nostrils daily. 16 g 6  . furosemide (LASIX) 80 MG tablet Take 1  tablet (80 mg total) by mouth 2 (two) times daily. (Patient taking differently: Take 160 mg by mouth 3 (three) times daily. ) 60 tablet 0  . gabapentin (NEURONTIN) 300 MG capsule Take 1 capsule (300 mg total) by mouth 2 (two) times daily as needed. (Patient taking differently: Take 900 mg by mouth daily. ) 60 capsule 3  . glucose blood (ONETOUCH VERIO) test strip Test 4 times daily. (Patient taking differently: 1 each by Other route 3 (three) times daily. ) 100 each 12  . insulin aspart (NOVOLOG) 100 UNIT/ML FlexPen Inject 10-15 Units into the skin See admin instructions. Inject 10 units subque in the morning and inject 15 units subqu at lunch and dinner    . insulin degludec (TRESIBA FLEXTOUCH) 100 UNIT/ML SOPN FlexTouch Pen Inject 26 Units into the skin daily with breakfast.     . linaclotide (LINZESS) 290 MCG CAPS capsule Take 1 capsule (290 mcg total) by mouth daily before breakfast. 30 capsule 11  . lisinopril (PRINIVIL,ZESTRIL) 20 MG tablet Take 20 mg by mouth 2 (two) times daily.    . metoCLOPramide (REGLAN) 5 MG tablet Take 1 tablet (5 mg total) by mouth 2 (two) times daily. 1 tab BID for 5 days 180 tablet 0  . omeprazole (PRILOSEC) 40 MG capsule Take 1 capsule (40 mg total) by mouth daily. 30 capsule 3  . senna-docusate (SENOKOT-S) 8.6-50 MG tablet Take 2 tablets by mouth daily. 60 tablet 5   No current facility-administered medications for this visit.      Allergies  Allergen Reactions  . Ibuprofen Other (See Comments)    CKD stage 3. Should avoid.  . Dilaudid [Hydromorphone Hcl] Itching and Other (See Comments)    Can take with benadryl     REVIEW OF SYSTEMS:  (Positives checked otherwise negative)  CARDIOVASCULAR:   [ ]  chest pain,  [ ]  chest pressure,  [ ]  palpitations,  [ ]  shortness of breath when laying flat,  [ ]  shortness of breath with exertion,   [ ]  pain in feet when walking,  [ ]  pain in feet when laying flat, [ ]  history of blood clot in veins (DVT),  [ ]   history of phlebitis,  [ ]  swelling in legs,  [ ]  varicose veins  PULMONARY:   [ ]  productive cough,  [ ]  asthma,  [ ]  wheezing  NEUROLOGIC:   [ ]  weakness in arms or legs,  [ ]  numbness in arms or legs,  [ ]  difficulty speaking or slurred speech,  [ ]  temporary loss of vision in one eye,  [ ]  dizziness  HEMATOLOGIC:   [ ]  bleeding problems,  [ ]   problems with blood clotting too easily  MUSCULOSKEL:   [ ]  joint pain, [ ]  joint swelling  GASTROINTEST:   [ ]  vomiting blood,  [ ]  blood in stool     GENITOURINARY:   [ ]  burning with urination,  [ ]  blood in urine [x]  CKD Stage V  PSYCHIATRIC:   [ ]  history of major depression  INTEGUMENTARY:   [ ]  rashes,  [ ]  ulcers  CONSTITUTIONAL:   [ ]  fever,  [ ]  chills   Physical Examination   Vitals:   04/22/17 1135  BP: 132/75  Pulse: 87  Resp: 16  Temp: 98 F (36.7 C)  SpO2: 98%  Weight: 235 lb (106.6 kg)  Height: 5\' 9"  (1.753 m)    Body mass index is 34.7 kg/m.   General: Alert, O x 3, WD,NAD  Pulmonary: Sym exp, good B air movt, CTA B  Cardiac: RRR, Nl S1, S2, no Murmurs, No rubs, No S3,S4  Vascular: Vessel Right Left  Radial Palpable Palpable  Brachial Palpable Palpable  Carotid Palpable, No Bruit Palpable, No Bruit  Aorta Not palpable N/A  Femoral Palpable Palpable  Popliteal Not palpable Not palpable  PT Palpable Palpable  DP Palpable Palpable   Gastrointestinal: soft, non-distended, non-tender to palpation, No guarding or rebound, no HSM, no masses, no CVAT B, No palpable prominent aortic pulse,    Musculoskeletal: M/S 5/5 throughout  , Extremities without ischemic changes  , No edema present,  , No LDS present, faintly palpable thrill L antecubitum, +bruit  Neurologic: Pain and light touch intact in extremities , Motor exam as listed above   Medical Decision Making   THERSA MOHIUDDIN is a 46 y.o. female who presents with nearing ESRD    Pt has now committed to HD, so will  offer her a LUA AVG with Artegraft given her youth and poor durability of PTFE in young patients.  Will ligate L 1st stage BRVT in the process of placing the Artegraft.  I had an extensive discussion with this patient in regards to the nature of access surgery, including risk, benefits, and alternatives.    The patient is aware that the risks of access surgery include but are not limited to: bleeding, infection, steal syndrome, nerve damage, ischemic monomelic neuropathy, failure of access to mature, and possible need for additional access procedures in the future.  The patient has agreed to proceed with the above procedure which will be scheduled 14 MAY 18.   Adele Barthel, MD, FACS [facility] Office: [phone] Pager: [phone]

## 2017-05-09 NOTE — Transfer of Care (Signed)
Immediate Anesthesia Transfer of Care Note  Patient: Heather [name]  Procedure(s) Performed: Procedure(s): INSERTION OF ARTERIOVENOUS (AV) GRAFT ARM (ARTEGRAFT) (Left) LIGATION OF ARTERIOVENOUS  FISTULA (Left)  Patient Location: PACU  Anesthesia Type:General  Level of Consciousness: awake, alert  and oriented  Airway & Oxygen Therapy: Patient Spontanous Breathing and Patient connected to nasal cannula oxygen  Post-op Assessment: Report given to RN and Post -op Vital signs reviewed and stable  Post vital signs: Reviewed and stable  Last Vitals:  Vitals:   05/09/17 0602 05/09/17 1023  BP: (!) 214/95 (!) 222/99  Pulse:  90  Resp:  12  Temp:  36.3 C    Last Pain:  Vitals:   05/09/17 0601  TempSrc: Oral      Patients Stated Pain Goal: 1 (83/81/84 0375)  Complications: No apparent anesthesia complications

## 2017-05-09 NOTE — Op Note (Signed)
OPERATIVE NOTE   PROCEDURE:   1.  Left upper arm arteriovenous graft (6 mm Artegraft) 2.  Ligation of left first stage brachial vein transposition   PRE-OPERATIVE DIAGNOSIS: chronic kidney disease stage IV-V   POST-OPERATIVE DIAGNOSIS: same as above   SURGEON: Adele Barthel, MD  ASSISTANT(S): RNFA  ANESTHESIA: general  ESTIMATED BLOOD LOSS: 30 cc  FINDING(S): 1. Stenotic left first stage brachial vein transposition: only 2-3 just distal to anastomosis 2. Baseline left ulnar artery flow > radial artery flow 3. No significant change in ulnar blood flow after outflow compression 4. Dopplerable Left ulnar > radial artery signals 5. Palpable thrill at end of case  SPECIMEN(S):  none  INDICATIONS:   Heather [name] is a 46 y.o. female who presents with chronic kidney disease stage IV-V with non-maturing left first stage brachial vein transposition.  Risk, benefits, and alternatives to access surgery were discussed.  The patient is aware the risks include but are not limited to: bleeding, infection, steal syndrome, nerve damage, ischemic monomelic neuropathy, failure to mature, and need for additional procedures.  The patient is aware of the risks and elects to proceed forward.  DESCRIPTION: After full informed written consent was obtained from the patient, the patient was brought back to the operating room and placed supine upon the operating table.  The patient was given IV antibiotics prior to proceeding.  After obtaining adequate sedation, the patient was prepped and draped in standard fashion for a left arm access procedure.  I turned my attention first to the antecubitum.  Under ultrasound guidance, I identified the location of the brachial artery and marked it on the skin.  I then examined the bicipital groove and identified the high brachial vein and marked it on the skin.    I made an incision over the brachial artery and prior anastomosis , and dissected down through the  subcutaneous tissue to the fascia carefully and was able to dissect out the brachial artery proximal to the anastomosis.  It was somewhat difficult to differentiate between the fistula and brachial artery due to scar tissue and vasospasm.  I dissected out the distal fistula and brachial artery and then packed this incision with a papavaerine soaked sponge.  I turned my attention to the high bicipital groove.  I made an incision here and dissected down through the subcutaneous tissue and fascia until I reached the high brachial vein.  Externally, it appeared to be 5-6 mm in diameter.  I then dissected this vein proximally and distal.    I then took a metal Gore tunneler and dissected from the antecubital incision up to the high bicipital incision.  Then I delivered the previously prepared and anteriorly marked 6 mm Artegraft, through this metal tunneler and then pulled out the metal tunneler leaving the graft in place.  At this point, I removed the papaverine soaked sponge and again it was difficult to determine which was the artery versus fistula.  I felt the fistula was likely more medial based on anatomic position.  I then gave the patient 10000 units of heparin to gain anticoagulation.  After waiting 3 minutes, I placed the presumed vein under tension proximally and distally.  I made a venotomy and the neointimal debris in this vein confirmed my suspicion.  I tied off the vein proximally with a 2-0 silk.  and then dissected a little more distally and then double ligated the segment of the vein distally, leaving essentially a vein patch on the  brachial artery. I transected this left first stage brachial vein transposition.  I turn my attention to 3-3.5 mm brachial artery.  I placed vessel loops proximally and distally.  The brachial artery was placed under tension proximally and distally with vessel loops.  I made an arteriotomy and extended it with a Potts scissor for a 4 mm arteriotomy.  I sewed the  distal end of the graft to this arteriotomy with a running stitch of 6-0 Prolene, taking asymmetric stitches to meet the size mismatch.  At this point, then I completed the anastomosis in the usual fashion.  I released the vessel loops on the inflow and allowed the artery to decompress through the graft. There was good pulsatile bleeding through this graft.  I clamped the graft near its arterial anastomosis and sucked out all the blood in the graft and loaded the graft with heparinized saline.    At this point, I pulled the graft to appropriate length and reset my exposure of the high brachial vein.  I tied off the high brachial vein distally with a 2-0 silk and then transected it.  There was good venous backbleeding from the vein.  Then, I injected some heparinized saline into this vein and then clamped it.  I then spatulated the vein to facilitate an end-to-end anastomosis.  I also spatulated the graft to facilitate an end-to-end anastomosis.  In the process of spatulating, I cut the graft to appropriate length for this anastomosis.  This graft was sewn to the vein in an end-to-end configuration with a 6-0 Prolene.  Prior to completing this anastomosis, I allowed the vein to back bleed and then I also allowed the artery to bleed in an antegrade fashion.  I completed this anastomosis in the usual fashion, and then irrigated out the high bicipital exposure and then placed Avitene.  I then turned my attention back to the antecubitum.  The distal radial signal was weakly dopplerable.  The ulnar artery was more strongly dopplerable than the radial artery.  Using a continuous Doppler, the brachial artery proximally and distally had multiphasic waveforms.  The venous outflow had a flow signature consistent with widely patent arteriovenous graft.  If compression, there was some augmentation of the radial signal but none with the ulnar signal.  At this point, I washed out the antecubital incision.  There was no more  active bleeding.  The subcutaneous tissue was reapproximated with a double layer running stitch of 3-0 Vicryl.  The skin was then reapproximated with a running subcuticular 4-0 Monocryl.  The skin was then cleaned, dried, and Dermabond used to reinforce the skin closure.    I then turned our attention to the high bicipital exposure.  I removed all the Avitene and washed out the wound.  There was no more active bleeding.  There was a near identical size match between the graft and vein.  The subcutaneous tissue was repaired with a double running layer of 3-0 Vicryl.  The skin was then reapproximated with running subcuticular 4-0 Monocryl.  The skin was then cleaned, dried, and then the skin closure was reinforced with Dermabond.     COMPLICATIONS: none  CONDITION: stable   Adele Barthel, MD, [facility] [place] Office: [phone] Pager: [phone]  05/09/2017, 10:08 AM

## 2017-05-09 NOTE — Interval H&P Note (Signed)
   History and Physical Update  The patient was interviewed and re-examined.  The patient's previous History and Physical has been reviewed and is unchanged from my consult.  There is no change in the plan of care: Ligation of L 1st BRVT, LUA AVG w/ Artegraft.   Risk, benefits, and alternatives to access surgery were discussed.    The patient is aware the risks include but are not limited to: bleeding, infection, steal syndrome, nerve damage, ischemic monomelic neuropathy, thrombosis, failure to mature, need for additional procedures, death and stroke.    The patient agrees to proceed forward with the procedure.   Adele Barthel, MD, FACS [facility] [place] Office: [phone] Pager: [phone]  05/09/2017, 6:55 AM

## 2017-05-09 NOTE — Anesthesia Postprocedure Evaluation (Addendum)
Anesthesia Post Note  Patient: Heather [name]  Procedure(s) Performed: Procedure(s) (LRB): INSERTION OF ARTERIOVENOUS (AV) GRAFT ARM (ARTEGRAFT) (Left) LIGATION OF ARTERIOVENOUS  FISTULA (Left)  Patient location during evaluation: PACU Anesthesia Type: General Level of consciousness: awake, awake and alert and oriented Pain management: pain level controlled Vital Signs Assessment: post-procedure vital signs reviewed and stable Respiratory status: spontaneous breathing, nonlabored ventilation and respiratory function stable Cardiovascular status: blood pressure returned to baseline Anesthetic complications: no       Last Vitals:  Vitals:   05/09/17 1038 05/09/17 1045  BP: (!) 221/103   Pulse: 85 81  Resp: 15 14  Temp:      Last Pain:  Vitals:   05/09/17 1038  TempSrc:   PainSc: 8                  Yardley Beltran COKER

## 2017-05-09 NOTE — Telephone Encounter (Signed)
Sched appt 06/24/17 at 2:30. Lm on cell# for pt to confirm appt.

## 2017-05-09 NOTE — Progress Notes (Signed)
    Unable to complete assessment and obtain the name and number of discharge transport due to CRNA's in the room starting IV.  Made multiple attempts to ask questions but patient unable to focus due to IV attempts.

## 2017-05-09 NOTE — Anesthesia Procedure Notes (Signed)
Procedure Name: LMA Insertion Date/Time: 05/09/2017 7:40 AM Performed by: Valda Favia Pre-anesthesia Checklist: Patient identified, Emergency Drugs available, Suction available, Patient being monitored and Timeout performed Patient Re-evaluated:Patient Re-evaluated prior to inductionOxygen Delivery Method: Circle system utilized Preoxygenation: Pre-oxygenation with 100% oxygen Intubation Type: IV induction LMA: LMA inserted LMA Size: 5.0 Number of attempts: 1 Placement Confirmation: positive ETCO2 and breath sounds checked- equal and bilateral Tube secured with: Tape Dental Injury: Teeth and Oropharynx as per pre-operative assessment

## 2017-05-09 NOTE — Discharge Instructions (Signed)

## 2017-05-10 ENCOUNTER — Encounter (HOSPITAL_COMMUNITY): Payer: Self-pay | Admitting: Vascular Surgery

## 2017-05-10 LAB — POCT I-STAT 4, (NA,K, GLUC, HGB,HCT)
Glucose, Bld: 153 mg/dL — ABNORMAL HIGH (ref 65–99)
HEMATOCRIT: 39 % (ref 36.0–46.0)
Hemoglobin: 13.3 g/dL (ref 12.0–15.0)
POTASSIUM: 3.7 mmol/L (ref 3.5–5.1)
Sodium: 142 mmol/L (ref 135–145)

## 2017-05-11 ENCOUNTER — Encounter: Payer: Self-pay | Admitting: *Deleted

## 2017-05-12 ENCOUNTER — Other Ambulatory Visit (HOSPITAL_COMMUNITY): Payer: Self-pay | Admitting: *Deleted

## 2017-05-13 ENCOUNTER — Ambulatory Visit (HOSPITAL_COMMUNITY): Admission: RE | Admit: 2017-05-13 | Payer: Managed Care, Other (non HMO) | Source: Ambulatory Visit

## 2017-05-16 IMAGING — NM NM GASTRIC EMPTYING
5 series · 5 of 5 positions shown · non-contrast
Comparison: None.

CLINICAL DATA: Abdominal pain, nausea, diabetes mellitus,
generalized abdominal pain, history gastric para cysts,
fibromyalgia, hypertension

EXAM:
NUCLEAR MEDICINE GASTRIC EMPTYING SCAN
TECHNIQUE: After oral ingestion of radiolabeled meal, sequential abdominal
images were obtained for 4 hours. Percentage of activity emptying
the stomach was calculated at 1 hour, 2 hour, 3 hour, and 4 hours.
RADIOPHARMACEUTICALS:  2 mCi 7c-NNm sulfur colloid in standardized
meal

[Series 1: 90 min · 4.14mm/px · 1 of 1 slices shown]
[im 1/1]
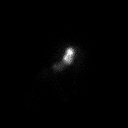

[Series 1: 0 min · 4.14mm/px · 1 of 1 slices shown]
[im 1/1]
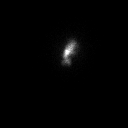

[Series 2: 1 hr · 4.14mm/px · 1 of 1 slices shown]
[im 1/1]
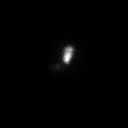

[Series 3: 2 hr · 4.14mm/px · 1 of 1 slices shown]
[im 1/1]
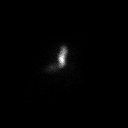

[Series 4: 4 hr · 4.14mm/px · 1 of 1 slices shown]
[im 1/1]
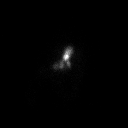

[5 of 5 positions shown; findings below may reference images not displayed]

FINDINGS: Expected location of the stomach in the left upper quadrant.

Ingested meal empties the stomach poorly over the course of the
study.

11% emptied at 1 hr ( normal >= 10%)

1% emptied at 2 hr ( normal >= 40%)

18% emptied at 3 hr ( normal >= 70%)

11% emptied at 4 hr ( normal >= 90%)
IMPRESSION: Markedly delayed gastric emptying with significantly decreased
emptying of tracer from the stomach over the 4 hours of imaging.

## 2017-05-27 ENCOUNTER — Ambulatory Visit (INDEPENDENT_AMBULATORY_CARE_PROVIDER_SITE_OTHER): Payer: Self-pay | Admitting: Vascular Surgery

## 2017-05-27 ENCOUNTER — Encounter: Payer: Self-pay | Admitting: Vascular Surgery

## 2017-05-27 VITALS — BP 128/80 | HR 86 | Temp 98.9°F | Resp 18 | Ht 63.0 in | Wt 238.3 lb

## 2017-05-27 DIAGNOSIS — N185 Chronic kidney disease, stage 5: Secondary | ICD-10-CM

## 2017-05-27 MED ORDER — CEPHALEXIN 500 MG PO CAPS: 500.0000 mg | ORAL_CAPSULE | Freq: Three times a day (TID) | ORAL | 0 refills | Status: DC

## 2017-05-27 NOTE — Progress Notes (Signed)
    Postoperative Access Visit   History of Present Illness   Heather [name] is a 46 y.o. year old female who presents for postoperative follow-up for: left upper arm arteriovenous graft with Artegraft, ligation of L 1st stage BRVT (Date: 05/09/17).  The patient's wounds are healed.  The patient notes no steal symptoms.  The patient is not able to complete their activities of daily living.  The patient's current symptoms are: pain and heat along graft.   Physical Examination   Vitals:   05/27/17 1553  BP: 128/80  Pulse: 86  Resp: 18  Temp: 98.9 F (37.2 C)  TempSrc: Oral  SpO2: 96%  Weight: 238 lb 4.8 oz (108.1 kg)  Height: 5\' 3"  (1.6 m)    LUE: Incision are healed, skin feels hot, hand grip is 5/5, sensation in digits is  intact, nopalpable thrill, bruit can not be auscultated, tender along graft route with mild erythema   Medical Decision Making   Heather [name] is a 46 y.o. year old female who presents s/p left upper arm arteriovenous graft with Artegraft, ligation of L 1st stage BRVT    Not clear if LUA AVG is infected vs rejection of graft (biologic) as only a few segment with minimal fluid around graft.  Will try to treat with Keflex 500 mg 1 PO tid x 10 days.  If this fails or patient gets worse clinically, will proceed with LUA AVG removal.  Will have pt follow up in 2 weeks.  Thank you for allowing [name] to participate in this patient's care.   Adele Barthel, MD, FACS [facility] [place] Office: [phone] Pager: [phone]

## 2017-06-02 ENCOUNTER — Encounter: Payer: Self-pay | Admitting: Vascular Surgery

## 2017-06-02 NOTE — Progress Notes (Signed)
    Postoperative Access Visit   History of Present Illness   AZALEA CEDAR is a 46 y.o. (29-Jul-1971) female who presents for postoperative follow-up for: left upper arm arteriovenous graft with Artegraft, ligation of L 1st stage BRVT (Date: 05/09/17).  The patient's wounds are healed.  The patient notes no steal symptoms.  The patient is not able to complete their activities of daily living.  The patient's current symptoms are: pain and heat along graft.   Physical Examination   There were no vitals filed for this visit.  LUE: Incision are healed, skin feels hot, hand grip is 5/5, sensation in digits is  intact, nopalpable thrill, bruit can not be auscultated, tender along graft route with mild erythema   Medical Decision Making   BONNI NEUSER is a 45 y.o. ([birth year]) female who presents s/p left upper arm arteriovenous graft with Artegraft, ligation of L 1st stage BRVT    Not clear if LUA AVG is infected vs rejection of graft (biologic) as only a few segment with minimal fluid around graft.  Will try to treat with Keflex 500 mg 1 PO tid x 10 days.  If this fails or patient gets worse clinically, will proceed with LUA AVG removal.  Will have pt follow up in 2 weeks.  Thank you for allowing [name] to participate in this patient's care.   Adele Barthel, MD, FACS [facility] [place] Office: [phone] Pager: [phone]

## 2017-06-03 ENCOUNTER — Ambulatory Visit (HOSPITAL_COMMUNITY)
Admission: RE | Admit: 2017-06-03 | Discharge: 2017-06-03 | Disposition: A | Discharge status: Home / Self Care | Payer: Managed Care, Other (non HMO) | Source: Ambulatory Visit | Attending: Nephrology | Admitting: Nephrology

## 2017-06-03 DIAGNOSIS — D631 Anemia in chronic kidney disease: Secondary | ICD-10-CM | POA: Insufficient documentation

## 2017-06-03 DIAGNOSIS — N185 Chronic kidney disease, stage 5: Secondary | ICD-10-CM | POA: Insufficient documentation

## 2017-06-03 DIAGNOSIS — N182 Chronic kidney disease, stage 2 (mild): Secondary | ICD-10-CM

## 2017-06-03 MED ORDER — EPOETIN ALFA 40000 UNIT/ML IJ SOLN: 40000.0000 [IU] | INTRAMUSCULAR | Status: DC

## 2017-06-03 MED ORDER — SODIUM CHLORIDE 0.9 % IV SOLN
510.0000 mg | Freq: Once | INTRAVENOUS | Status: DC
  Filled 2017-06-03: qty 17

## 2017-06-03 NOTE — Progress Notes (Signed)
We were unable to get a IV site on the patient today 2 nurses and 2 IV nurses could not find a site.  PT stated that she wanted come back next week and try.  We called [facility] and left a message with queenie

## 2017-06-06 ENCOUNTER — Other Ambulatory Visit (HOSPITAL_COMMUNITY): Payer: Self-pay | Admitting: *Deleted

## 2017-06-07 ENCOUNTER — Ambulatory Visit (HOSPITAL_COMMUNITY)
Admission: RE | Admit: 2017-06-07 | Discharge: 2017-06-07 | Disposition: A | Discharge status: Home / Self Care | Payer: Managed Care, Other (non HMO) | Source: Ambulatory Visit | Attending: Nephrology | Admitting: Nephrology

## 2017-06-07 DIAGNOSIS — D631 Anemia in chronic kidney disease: Secondary | ICD-10-CM | POA: Insufficient documentation

## 2017-06-07 DIAGNOSIS — N183 Chronic kidney disease, stage 3 (moderate): Secondary | ICD-10-CM | POA: Insufficient documentation

## 2017-06-07 DIAGNOSIS — N182 Chronic kidney disease, stage 2 (mild): Secondary | ICD-10-CM

## 2017-06-07 MED ORDER — SODIUM CHLORIDE 0.9 % IV SOLN
510.0000 mg | Freq: Once | INTRAVENOUS | Status: DC
  Filled 2017-06-07: qty 17

## 2017-06-07 MED ORDER — EPOETIN ALFA 40000 UNIT/ML IJ SOLN
40000.0000 [IU] | INTRAMUSCULAR | Status: DC
  Administered 2017-06-07: 10:00:00 40000 [IU] via SUBCUTANEOUS

## 2017-06-07 MED ORDER — EPOETIN ALFA 40000 UNIT/ML IJ SOLN
INTRAMUSCULAR | Status: AC
  Filled 2017-06-07: qty 1

## 2017-06-07 NOTE — Progress Notes (Signed)
Patient came to short stay last week for feraheme and procrit and labs but IV team was unable to get access or blood so pt was sent home and rescheduled for today.  Two IV team nurses attempted access with use of the ultrasound and after 4 sticks were still unsuccessful.  They were able to obtain enough blood for a hemocue and the result was 8.7.  We verified that result against a finger stick which said 9.8.  We gave her her shot and called Stacey at [facility], left her a voicemail stating the above and asked her to call the patient regarding what to do next.

## 2017-06-09 LAB — POCT HEMOGLOBIN-HEMACUE: HEMOGLOBIN: 9.8 g/dL — AB (ref 12.0–15.0)

## 2017-06-10 ENCOUNTER — Encounter: Payer: Self-pay | Admitting: Vascular Surgery

## 2017-06-10 ENCOUNTER — Ambulatory Visit (INDEPENDENT_AMBULATORY_CARE_PROVIDER_SITE_OTHER): Payer: Self-pay | Admitting: Vascular Surgery

## 2017-06-10 VITALS — BP 157/93 | HR 86 | Temp 99.3°F | Resp 16 | Ht 69.0 in | Wt 239.0 lb

## 2017-06-10 DIAGNOSIS — N185 Chronic kidney disease, stage 5: Secondary | ICD-10-CM

## 2017-06-10 NOTE — Progress Notes (Signed)
POST OPERATIVE OFFICE NOTE    CC:  F/u for surgery  HPI:  This is a 46 y.o. female who is s/p  left upper arm arteriovenous graft with Artegraft, ligation of L 1st stage BRVT (Date: 05/09/17).   She states she is not having pain or eyruthema in the left UE.     Allergies  Allergen Reactions  . Ibuprofen Other (See Comments)    CKD stage 3. Should avoid.  . Dilaudid [Hydromorphone Hcl] Itching and Other (See Comments)    Can take with benadryl    Current Outpatient Prescriptions  Medication Sig Dispense Refill  . albuterol (PROVENTIL HFA;VENTOLIN HFA) 108 (90 Base) MCG/ACT inhaler Inhale 2 puffs into the lungs every 6 (six) hours as needed for wheezing or shortness of breath. 1 Inhaler 2  . calcitRIOL (ROCALTROL) 0.25 MCG capsule Take 0.5 mcg by mouth daily.     . cephALEXin (KEFLEX) 500 MG capsule Take 1 capsule (500 mg total) by mouth 3 (three) times daily. 21 capsule 0  . cetirizine (ZYRTEC) 5 MG tablet 1/2 po daily prn allergic rhinitis 24 tablet 0  . cyclobenzaprine (FLEXERIL) 10 MG tablet Take 1 tablet (10 mg total) by mouth 2 (two) times daily as needed for muscle spasms. 10 tablet 0  . epoetin alfa (EPOGEN,PROCRIT) 26712 UNIT/ML injection Inject 30,000 Units into the vein every 14 (fourteen) days.     . fluticasone (FLONASE) 50 MCG/ACT nasal spray Place 2 sprays into both nostrils daily. (Patient taking differently: Place 2 sprays into both nostrils daily as needed for allergies. ) 16 g 6  . furosemide (LASIX) 80 MG tablet Take 1 tablet (80 mg total) by mouth 2 (two) times daily. (Patient taking differently: Take 160 mg by mouth 2 (two) times daily. ) 60 tablet 0  . gabapentin (NEURONTIN) 300 MG capsule Take 1 capsule (300 mg total) by mouth 2 (two) times daily as needed. (Patient taking differently: Take 300 mg by mouth 2 (two) times daily as needed (pain). ) 60 capsule 3  . glucose blood (ONETOUCH VERIO) test strip Test 4 times daily. (Patient taking differently: 1 each by Other  route 3 (three) times daily. ) 100 each 12  . insulin aspart (NOVOLOG) 100 UNIT/ML FlexPen Inject 8 Units into the skin daily with breakfast.     . insulin degludec (TRESIBA FLEXTOUCH) 100 UNIT/ML SOPN FlexTouch Pen Inject 30 Units into the skin daily with breakfast.     . linaclotide (LINZESS) 290 MCG CAPS capsule Take 1 capsule (290 mcg total) by mouth daily before breakfast. 30 capsule 11  . lisinopril (PRINIVIL,ZESTRIL) 40 MG tablet Take 40 mg by mouth daily.    [name] omeprazole (PRILOSEC) 40 MG capsule Take 1 capsule (40 mg total) by mouth daily. 30 capsule 3  . senna-docusate (SENOKOT-S) 8.6-50 MG tablet Take 2 tablets by mouth daily. 60 tablet 5  . metoCLOPramide (REGLAN) 5 MG tablet Take 1 tablet (5 mg total) by mouth 2 (two) times daily. 1 tab BID for 5 days (Patient not taking: Reported on 05/04/2017) 180 tablet 0   No current facility-administered medications for this visit.      ROS:  See HPI  Physical Exam:  Vitals:   06/10/17 1548  BP: (!) 157/93  Pulse: 86  Resp: 16  Temp: 99.3 F (37.4 C)    Incision:  Well healed left upper arm Extremities:  Palpable radial pulse, grip 5/5 left UE.  No thrill in graft.     Assessment/Plan:  This is  a 46 y.o. female who is s/p:  left upper arm arteriovenous graft with Artegraft, ligation of L 1st stage BRVT (Date: 05/09/17).    [name] not yet on HD.  We will let her recover for another 3-4 weeks.  She will f/u with new right arm vein mapping to discuss future access options with Dr. [name].     Theda Sers Randol Zumstein [facility] PA-C [facility] [phone]  Clinic MD:  [name]  Addendum  I have independently interviewed and examined the patient, and I agree with the physician assistant's findings.  Cellulitis appears to be resolved with resolution of perigraft swelling also.  Unclear to me if this patient had a reaction also to the Artegraft material.  Would avoid use in subsequent procedures.  Adele Barthel, MD, FACS [facility] [place] Office: [phone] Pager: [phone]  06/10/2017, 4:24 PM

## 2017-06-17 NOTE — Addendum Note (Signed)
Addended by: Lianne Cure A on: 06/17/2017 02:43 PM   Modules accepted: Orders

## 2017-06-24 ENCOUNTER — Encounter (HOSPITAL_COMMUNITY)
Admission: RE | Admit: 2017-06-24 | Discharge: 2017-06-24 | Disposition: A | Discharge status: Home / Self Care | Payer: Managed Care, Other (non HMO) | Source: Ambulatory Visit | Attending: Nephrology | Admitting: Nephrology

## 2017-06-24 ENCOUNTER — Encounter: Payer: Managed Care, Other (non HMO) | Admitting: Vascular Surgery

## 2017-06-24 DIAGNOSIS — N182 Chronic kidney disease, stage 2 (mild): Secondary | ICD-10-CM | POA: Diagnosis present

## 2017-06-24 LAB — POCT HEMOGLOBIN-HEMACUE: HEMOGLOBIN: 10.8 g/dL — AB (ref 12.0–15.0)

## 2017-06-24 MED ORDER — EPOETIN ALFA 40000 UNIT/ML IJ SOLN
INTRAMUSCULAR | Status: AC
Start: 2017-06-24 — End: 2017-06-24
  Administered 2017-06-24: 40000 [IU] via SUBCUTANEOUS
  Filled 2017-06-24: qty 1

## 2017-06-24 MED ORDER — EPOETIN ALFA 40000 UNIT/ML IJ SOLN
40000.0000 [IU] | INTRAMUSCULAR | Status: DC
  Administered 2017-06-24: 40000 [IU] via SUBCUTANEOUS

## 2017-06-30 ENCOUNTER — Ambulatory Visit (INDEPENDENT_AMBULATORY_CARE_PROVIDER_SITE_OTHER): Payer: Managed Care, Other (non HMO)

## 2017-06-30 ENCOUNTER — Ambulatory Visit (INDEPENDENT_AMBULATORY_CARE_PROVIDER_SITE_OTHER): Payer: Managed Care, Other (non HMO) | Admitting: Family Medicine

## 2017-06-30 ENCOUNTER — Other Ambulatory Visit (HOSPITAL_COMMUNITY): Payer: Self-pay | Admitting: *Deleted

## 2017-06-30 ENCOUNTER — Encounter: Payer: Self-pay | Admitting: Family Medicine

## 2017-06-30 VITALS — BP 138/82 | HR 91 | Temp 98.2°F | Ht 69.0 in | Wt 239.0 lb

## 2017-06-30 DIAGNOSIS — Z0001 Encounter for general adult medical examination with abnormal findings: Secondary | ICD-10-CM | POA: Diagnosis not present

## 2017-06-30 DIAGNOSIS — R079 Chest pain, unspecified: Secondary | ICD-10-CM

## 2017-06-30 DIAGNOSIS — E113513 Type 2 diabetes mellitus with proliferative diabetic retinopathy with macular edema, bilateral: Secondary | ICD-10-CM | POA: Diagnosis not present

## 2017-06-30 DIAGNOSIS — Z0000 Encounter for general adult medical examination without abnormal findings: Secondary | ICD-10-CM

## 2017-06-30 DIAGNOSIS — K5904 Chronic idiopathic constipation: Secondary | ICD-10-CM

## 2017-06-30 DIAGNOSIS — E1165 Type 2 diabetes mellitus with hyperglycemia: Secondary | ICD-10-CM

## 2017-06-30 DIAGNOSIS — Z7902 Long term (current) use of antithrombotics/antiplatelets: Secondary | ICD-10-CM | POA: Insufficient documentation

## 2017-06-30 DIAGNOSIS — N185 Chronic kidney disease, stage 5: Secondary | ICD-10-CM

## 2017-06-30 DIAGNOSIS — Z794 Long term (current) use of insulin: Secondary | ICD-10-CM | POA: Diagnosis not present

## 2017-06-30 DIAGNOSIS — IMO0002 Reserved for concepts with insufficient information to code with codable children: Secondary | ICD-10-CM

## 2017-06-30 DIAGNOSIS — E1140 Type 2 diabetes mellitus with diabetic neuropathy, unspecified: Secondary | ICD-10-CM

## 2017-06-30 HISTORY — DX: Long term (current) use of antithrombotics/antiplatelets: Z79.02

## 2017-06-30 MED ORDER — GABAPENTIN 300 MG PO CAPS: 300.0000 mg | ORAL_CAPSULE | Freq: Two times a day (BID) | ORAL | 3 refills | Status: DC | PRN

## 2017-06-30 MED ORDER — LINACLOTIDE 145 MCG PO CAPS: 145.0000 ug | ORAL_CAPSULE | Freq: Every day | ORAL | 5 refills | Status: DC

## 2017-06-30 NOTE — Progress Notes (Signed)
Subjective:    Water quality scientist, CMA, acting as scribe for Dr. Juleen China.  Heather [name] is a 46 y.o. female and is here for a comprehensive physical exam.  Chest Pain   This is a new problem. The current episode started 1 to 4 weeks ago. The onset quality is gradual. The problem occurs intermittently. The pain is present in the lateral region. The pain is mild. The quality of the pain is described as dull. The pain does not radiate. The pain is aggravated by nothing. She has tried nothing for the symptoms.   Health Maintenance Due  Topic Date Due  . FOOT EXAM  05/12/2017   PMHx, SurgHx, SocialHx, Medications, and Allergies were reviewed in the Visit Navigator and updated as appropriate.   Past Medical History:  Diagnosis Date  . Allergy   . Anemia of chronic disease   . B12 deficiency 05/10/2014  . Blood transfusion without reported diagnosis   . Bronchitis   . Chronic constipation   . Chronic kidney disease   . Constipation   . Diabetic neuropathy, painful ([place]) 07/13/2013   Stop lyrica due to cost & SE of drowsy. Gabapentin helps Stay off hydrocodone: pt states she thinks "she was addicted" to med. "I don't want that anymore."   . DM2 (diabetes mellitus, type 2) ([place])   . Dyspnea    with exertion  . Fibromyalgia   . Gastroparesis due to DM   . GERD (gastroesophageal reflux disease)   . Headache   . Heart murmur, systolic 70/05/2375   Grade 1/6 ejection murmur per [facility] notes.   . Hyperlipemia   . Hypertension   . IBS (irritable bowel syndrome)   . Infertility   . Leiomyoma of uterus   . Morbid obesity due to excess calories ([place]) 11/16/2016  . Neuropathy   . Obesity   . Pancreatitis   . 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 Right-sided low back pain without sciatica 12/08/2015  . Secondary hyperthyroidism 11/27/2013   Followed by [facility], notes reviewed 01/01/15. PTH had improved with oral vitamin D: 133 to 85. Last 155-probably not  taking vit D.    [name] Upper airway cough syndrome 11/15/2016   rec off acei/ on pepcid hs 11/15/2016 >>>    Past Surgical History:  Procedure Laterality Date  . AV FISTULA PLACEMENT Left 05/09/2017   Procedure: INSERTION OF ARTERIOVENOUS (AV) GRAFT ARM (ARTEGRAFT);  Surgeon: Conrad Winfield, MD;  Location: [place];  Service: Vascular;  Laterality: Left;  . BASCILIC VEIN TRANSPOSITION Left 08/31/2016   Procedure: BASILIC VEIN TRANSPOSITION FIRST STAGE;  Surgeon: Conrad Rehobeth, MD;  Location: [place];  Service: Vascular;  Laterality: Left;  . FOOT SURGERY Right    t"ook bone out- maybe hammer toe"  . LIGATION OF ARTERIOVENOUS  FISTULA Left 05/09/2017   Procedure: LIGATION OF ARTERIOVENOUS  FISTULA;  Surgeon: Conrad Titusville, MD;  Location: [place];  Service: Vascular;  Laterality: Left;  . MYOMECTOMY    . VITRECTOMY Bilateral    Family History  Problem Relation Age of Onset  . Colon polyps Mother   . Diabetes Mother   . Heart murmur Father   . Hypertension Father   . Diabetes Sister   . Kidney failure Sister   . Diabetes Brother   . Retinal degeneration Brother   . Heart murmur Son   . Stroke Paternal Grandmother   . Diabetes Sister    Social History  Substance Use  Topics  . Smoking status: Never Smoker  . Smokeless tobacco: Never Used  . Alcohol use No   Review of Systems:   Review of Systems  Cardiovascular: Positive for chest pain.  All other systems reviewed and are negative.  Objective:   BP 138/82   Pulse 91   Temp 98.2 F (36.8 C) (Oral)   Ht 5\' 9"  (1.753 m)   Wt 239 lb (108.4 kg)   LMP 06/23/2017   SpO2 99%   BMI 35.29 kg/m  Body mass index is 35.29 kg/m.  General Appearance:    Alert, cooperative, no distress, appears stated age  Head:    Normocephalic, without obvious abnormality, atraumatic  Eyes:    PERRL, conjunctiva/corneas clear, EOM's intact  Ears:    Normal TM's and external ear canals, both ears  Nose:   Nares normal, septum midline, mucosa normal, no  drainage or sinus tenderness  Throat:   Lips, mucosa, and tongue normal; teeth and gums normal  Neck:   Supple, trachea midline, no adenopathy; thyroid: no nodules; no carotid bruit or JVD  Back:     Symmetric, no curvature, ROM normal, no CVA tenderness  Lungs:     Clear to auscultation bilaterally, respirations unlabored  Chest Wall:    No tenderness or deformity   Heart:    Regular rate and rhythm, S1 and S2 normal, without murmur   Abdomen:     Soft, non-tender, bowel sounds active all four quadrants, no masses, no organomegaly  Extremities:   Extremities normal, atraumatic, no cyanosis or edema  Pulses:   1+ and symmetric all extremities  Skin:   Skin color, texture, turgor normal  Lymph nodes:   Cervical, supraclavicular, and axillary nodes normal  Neurologic:   CNII-XII intact, normal strength, sensation and reflexes  throughout   Diabetic Foot Exam - Simple   Simple Foot Form Diabetic Foot exam was performed with the following findings:  Yes 06/30/2017 12:14 PM  Visual Inspection See comments:  Yes Sensation Testing See comments:  Yes Pulse Check Posterior Tibialis and Dorsalis pulse intact bilaterally:  Yes Comments Left foot with small skin tear at toe - no infection. Right foot with healing burn to metatarsals. Poor sensation bilaterally.    Assessment/Plan:   [place] was seen today for annual exam.  Diagnoses and all orders for this visit:  Routine physical examination  Chest pain, unspecified type Comments: Studies to maximize management.  Orders: -     EKG 12-Lead - NSR -     DG Chest 2 View - no acute findings -     CBC with Differential/Platelet; Future -     Comprehensive metabolic panel; Future -     TSH; Future -     Lipid panel; Future  Chronic idiopathic constipation -     linaclotide (LINZESS) 145 MCG CAPS capsule; Take 1 capsule (145 mcg total) by mouth daily before breakfast.  Morbid obesity due to excess calories ([place]) Comments: The patient is  asked to make an attempt to improve diet and exercise patterns to aid in medical management of this problem.   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 Followed by Vascular - working on graft.  Orders: -     VITAMIN D 25 Hydroxy (Vit-D Deficiency, Fractures); Future  Diabetic neuropathy, painful (  HCC) -     gabapentin (NEURONTIN) 300 MG capsule; Take 1 capsule (300 mg total) by mouth 2 (two) times daily as needed.   Patient Counseling: [x]    Nutrition: Stressed importance of moderation in sodium/caffeine intake, saturated fat and cholesterol, caloric balance, sufficient intake of fresh fruits, vegetables, fiber, calcium, iron, and 1 mg of folate supplement per day (for females capable of pregnancy).  [x]    Stressed the importance of regular exercise.   [x]    Substance Abuse: Discussed cessation/primary prevention of tobacco, alcohol, or other drug use; driving or other dangerous activities under the influence; availability of treatment for abuse.   [x]    Injury prevention: Discussed safety belts, safety helmets, smoke detector, smoking near bedding or upholstery.   [x]    Sexuality: Discussed sexually transmitted diseases, partner selection, use of condoms, avoidance of unintended pregnancy  and contraceptive alternatives.  [x]    Dental health: Discussed importance of regular tooth brushing, flossing, and dental visits.  [x]    Health maintenance and immunizations reviewed. Please refer to Health maintenance section.   Briscoe Deutscher, DO [place] [facility] [place] served as Education administrator during this visit. History, Physical, and Plan performed by medical provider. Documentation and orders reviewed and attested to. Briscoe Deutscher, D.O.

## 2017-07-01 ENCOUNTER — Ambulatory Visit (HOSPITAL_COMMUNITY)
Admission: RE | Admit: 2017-07-01 | Discharge: 2017-07-01 | Disposition: A | Discharge status: Home / Self Care | Payer: Managed Care, Other (non HMO) | Source: Ambulatory Visit | Attending: Nephrology | Admitting: Nephrology

## 2017-07-01 ENCOUNTER — Other Ambulatory Visit (HOSPITAL_COMMUNITY): Payer: Self-pay | Admitting: *Deleted

## 2017-07-01 DIAGNOSIS — D631 Anemia in chronic kidney disease: Secondary | ICD-10-CM | POA: Diagnosis not present

## 2017-07-01 DIAGNOSIS — N185 Chronic kidney disease, stage 5: Secondary | ICD-10-CM | POA: Insufficient documentation

## 2017-07-01 MED ORDER — SODIUM CHLORIDE 0.9 % IV SOLN
510.0000 mg | Freq: Once | INTRAVENOUS | Status: AC
  Administered 2017-07-01: 10:00:00 510 mg via INTRAVENOUS
  Filled 2017-07-01: qty 17

## 2017-07-05 ENCOUNTER — Encounter: Payer: Self-pay | Admitting: Family Medicine

## 2017-07-08 ENCOUNTER — Ambulatory Visit (HOSPITAL_COMMUNITY)
Admission: RE | Admit: 2017-07-08 | Discharge: 2017-07-08 | Disposition: A | Discharge status: Home / Self Care | Payer: Managed Care, Other (non HMO) | Source: Ambulatory Visit | Attending: Nephrology | Admitting: Nephrology

## 2017-07-08 DIAGNOSIS — N182 Chronic kidney disease, stage 2 (mild): Secondary | ICD-10-CM | POA: Insufficient documentation

## 2017-07-08 LAB — FERRITIN: FERRITIN: 1031 ng/mL — AB (ref 11–307)

## 2017-07-08 LAB — IRON AND TIBC
Iron: 103 ug/dL (ref 28–170)
SATURATION RATIOS: 48 % — AB (ref 10.4–31.8)
TIBC: 216 ug/dL — AB (ref 250–450)
UIBC: 113 ug/dL

## 2017-07-08 LAB — POCT HEMOGLOBIN-HEMACUE: HEMOGLOBIN: 11.4 g/dL — AB (ref 12.0–15.0)

## 2017-07-08 MED ORDER — EPOETIN ALFA 40000 UNIT/ML IJ SOLN
40000.0000 [IU] | INTRAMUSCULAR | Status: DC
  Administered 2017-07-08: 40000 [IU] via SUBCUTANEOUS

## 2017-07-08 MED ORDER — EPOETIN ALFA 40000 UNIT/ML IJ SOLN
INTRAMUSCULAR | Status: AC
  Filled 2017-07-08: qty 1

## 2017-07-18 ENCOUNTER — Encounter: Payer: Self-pay | Admitting: Family Medicine

## 2017-07-19 ENCOUNTER — Encounter: Payer: Self-pay | Admitting: Vascular Surgery

## 2017-07-22 ENCOUNTER — Encounter (HOSPITAL_COMMUNITY)
Admission: RE | Admit: 2017-07-22 | Discharge: 2017-07-22 | Disposition: A | Discharge status: Home / Self Care | Payer: Managed Care, Other (non HMO) | Source: Ambulatory Visit | Attending: Nephrology | Admitting: Nephrology

## 2017-07-22 DIAGNOSIS — N182 Chronic kidney disease, stage 2 (mild): Secondary | ICD-10-CM | POA: Insufficient documentation

## 2017-07-22 LAB — POCT HEMOGLOBIN-HEMACUE: HEMOGLOBIN: 9.9 g/dL — AB (ref 12.0–15.0)

## 2017-07-22 MED ORDER — EPOETIN ALFA 40000 UNIT/ML IJ SOLN
40000.0000 [IU] | INTRAMUSCULAR | Status: DC
  Administered 2017-07-22: 40000 [IU] via SUBCUTANEOUS

## 2017-07-22 MED ORDER — EPOETIN ALFA 40000 UNIT/ML IJ SOLN
INTRAMUSCULAR | Status: AC
  Filled 2017-07-22: qty 1

## 2017-07-29 ENCOUNTER — Encounter: Payer: Self-pay | Admitting: Family Medicine

## 2017-07-29 ENCOUNTER — Other Ambulatory Visit (HOSPITAL_COMMUNITY)
Admission: RE | Admit: 2017-07-29 | Discharge: 2017-07-29 | Disposition: A | Discharge status: Home / Self Care | Payer: Managed Care, Other (non HMO) | Source: Ambulatory Visit | Attending: Family Medicine | Admitting: Family Medicine

## 2017-07-29 ENCOUNTER — Ambulatory Visit (INDEPENDENT_AMBULATORY_CARE_PROVIDER_SITE_OTHER): Payer: Managed Care, Other (non HMO) | Admitting: Family Medicine

## 2017-07-29 VITALS — BP 130/86 | HR 92 | Temp 97.7°F | Ht 69.0 in | Wt 237.4 lb

## 2017-07-29 DIAGNOSIS — N760 Acute vaginitis: Secondary | ICD-10-CM | POA: Insufficient documentation

## 2017-07-29 DIAGNOSIS — B9689 Other specified bacterial agents as the cause of diseases classified elsewhere: Secondary | ICD-10-CM | POA: Diagnosis not present

## 2017-07-29 DIAGNOSIS — N898 Other specified noninflammatory disorders of vagina: Secondary | ICD-10-CM

## 2017-07-29 DIAGNOSIS — R300 Dysuria: Secondary | ICD-10-CM | POA: Diagnosis not present

## 2017-07-29 DIAGNOSIS — R8290 Unspecified abnormal findings in urine: Secondary | ICD-10-CM | POA: Diagnosis not present

## 2017-07-29 LAB — POCT URINALYSIS DIPSTICK
Bilirubin, UA: NEGATIVE
Blood, UA: NEGATIVE
Glucose, UA: NEGATIVE
Ketones, UA: NEGATIVE
Leukocytes, UA: NEGATIVE
Nitrite, UA: NEGATIVE
Spec Grav, UA: 1.02 (ref 1.010–1.025)
Urobilinogen, UA: 0.2 E.U./dL
pH, UA: 6 (ref 5.0–8.0)

## 2017-07-29 NOTE — Progress Notes (Signed)
Heather [name] is a 46 y.o. female here for an acute visit.  History of Present Illness:   Shaune Pascal CMA acting as scribe for Dr. Juleen China.  HPI: Patient comes in today for urinary symptoms. Patient has had an odor and urine has been cloudy for about one month. She also endorses slight vaginal DC that is white and thin, without or itch. No concern for STIs.   PMHx, SurgHx, SocialHx, Medications, and Allergies were reviewed in the Visit Navigator and updated as appropriate.  Current Medications:   .  albuterol (PROVENTIL HFA;VENTOLIN HFA) 108 (90 Base) MCG/ACT inhaler, Inhale 2 puffs into the lungs every 6 (six) hours as needed for wheezing or shortness of breath., Disp: 1 Inhaler, Rfl: 2 .  calcitRIOL (ROCALTROL) 0.25 MCG capsule, Take 0.5 mcg by mouth daily. , Disp: , Rfl:  .  cetirizine (ZYRTEC) 5 MG tablet, 1/2 po daily prn allergic rhinitis, Disp: 24 tablet, Rfl: 0 .  cyclobenzaprine (FLEXERIL) 10 MG tablet, Take 1 tablet (10 mg total) by mouth 2 (two) times daily as needed for muscle spasms., Disp: 10 tablet, Rfl: 0 .  epoetin alfa (EPOGEN,PROCRIT) 10626 UNIT/ML injection, Inject 30,000 Units into the vein every 14 (fourteen) days. , Disp: , Rfl:  .  fluticasone (FLONASE) 50 MCG/ACT nasal spray, Place 2 sprays into both nostrils daily. (Patient taking differently: Place 2 sprays into both nostrils daily as needed for allergies. ), Disp: 16 g, Rfl: 6 .  furosemide (LASIX) 80 MG tablet, Take 1 tablet (80 mg total) by mouth 2 (two) times daily. (Patient taking differently: Take 160 mg by mouth 2 (two) times daily. ), Disp: 60 tablet, Rfl: 0 .  gabapentin (NEURONTIN) 300 MG capsule, Take 1 capsule (300 mg total) by mouth 2 (two) times daily as needed., Disp: 60 capsule, Rfl: 3 .  glucose blood (ONETOUCH VERIO) test strip, Test 4 times daily. (Patient taking differently: 1 each by Other route 3 (three) times daily. ), Disp: 100 each, Rfl: 12 .  insulin aspart (NOVOLOG) 100 UNIT/ML  FlexPen, Inject 8 Units into the skin daily with breakfast. , Disp: , Rfl:  .  insulin degludec (TRESIBA FLEXTOUCH) 100 UNIT/ML SOPN FlexTouch Pen, Inject 30 Units into the skin daily with breakfast. , Disp: , Rfl:  .  linaclotide (LINZESS) 145 MCG CAPS capsule, Take 1 capsule (145 mcg total) by mouth daily before breakfast., Disp: 30 capsule, Rfl: 5 .  lisinopril (PRINIVIL,ZESTRIL) 40 MG tablet, Take 40 mg by mouth daily., Disp: , Rfl:  .  metoCLOPramide (REGLAN) 5 MG tablet, Take 1 tablet (5 mg total) by mouth 2 (two) times daily. 1 tab BID for 5 days, Disp: 180 tablet, Rfl: 0 .  omeprazole (PRILOSEC) 40 MG capsule, Take 1 capsule (40 mg total) by mouth daily., Disp: 30 capsule, Rfl: 3 .  senna-docusate (SENOKOT-S) 8.6-50 MG tablet, Take 2 tablets by mouth daily., Disp: 60 tablet, Rfl: 5   Allergies  Allergen Reactions  . Ibuprofen Other (See Comments)    CKD stage 3. Should avoid.  . Dilaudid [Hydromorphone Hcl] Itching and Other (See Comments)    Can take with benadryl   Review of Systems:   Pertinent items are noted in the HPI. Otherwise, ROS is negative.  Vitals:   Vitals:   07/29/17 0757  BP: 130/86  Pulse: 92  Temp: 97.7 F (36.5 C)  TempSrc: Oral  SpO2: 96%  Weight: 237 lb 6.4 oz (107.7 kg)  Height: 5\' 9"  (1.753 m)  Body mass index is 35.06 kg/m.  Physical Exam:   Physical Exam  Constitutional: She appears well-nourished.  HENT:  Head: Normocephalic and atraumatic.  Eyes: Pupils are equal, round, and reactive to light. EOM are normal.  Neck: Normal range of motion. Neck supple.  Cardiovascular: Normal rate, regular rhythm, normal heart sounds and intact distal pulses.   Pulmonary/Chest: Effort normal.  Abdominal: Soft.  Genitourinary: There is no rash on the right labia. There is no rash on the left labia. Cervix exhibits no motion tenderness, no discharge and no friability. Vaginal discharge found.  Skin: Skin is warm.  Psychiatric: She has a normal mood  and affect. Her behavior is normal.  Nursing note and vitals reviewed.  Results for orders placed or performed in visit on 07/29/17  POCT urinalysis dipstick  Result Value Ref Range   Color, UA Yellow    Clarity, UA Clear    Glucose, UA Negative    Bilirubin, UA Negative    Ketones, UA Negative    Spec Grav, UA 1.020 1.010 - 1.025   Blood, UA Negative    pH, UA 6.0 5.0 - 8.0   Protein, UA 2+    Urobilinogen, UA 0.2 0.2 or 1.0 E.U./dL   Nitrite, UA Negative    Leukocytes, UA Negative Negative   Assessment and Plan:   Diagnoses and all orders for this visit:  Abnormal urine odor Comments: Urine negative for bacteria. Orders: -     POCT urinalysis dipstick  Vaginal discharge Comments: No red flags on exam. Will wait on wet prep results.  Orders: -     Cytology - PAP   . Reviewed expectations re: course of current medical issues. . Discussed self-management of symptoms. . Outlined signs and symptoms indicating need for more acute intervention. . Patient verbalized understanding and all questions were answered. [name] Health Maintenance issues including appropriate healthy diet, exercise, and smoking avoidance were discussed with patient. . See orders for this visit as documented in the electronic medical record. . Patient received an After Visit Summary.  CMA served as Education administrator during this visit. History, Physical, and Plan performed by medical provider. The above documentation has been reviewed and is accurate and complete. Briscoe Deutscher, D.O.  Briscoe Deutscher, DO [place] 07/31/2017  Future Appointments Date Time Provider [place]  08/05/2017 8:30 AM [facility] INJECTION ROOM [facility] None  08/05/2017 2:30 PM [facility] VASC 1 [facility]  08/05/2017 3:30 PM Conrad Trimble, MD [facility]  01/02/2018 8:15 AM Briscoe Deutscher, DO [facility] None

## 2017-08-02 ENCOUNTER — Other Ambulatory Visit: Payer: Self-pay

## 2017-08-02 LAB — CYTOLOGY - PAP
Bacterial vaginitis: POSITIVE — AB
Candida vaginitis: NEGATIVE
Diagnosis: NEGATIVE
HPV: NOT DETECTED

## 2017-08-02 MED ORDER — METRONIDAZOLE 250 MG PO TABS: 250.0000 mg | ORAL_TABLET | Freq: Two times a day (BID) | ORAL | 0 refills | Status: DC

## 2017-08-03 NOTE — Progress Notes (Signed)
    Postoperative Access Visit   History of Present Illness   Heather [name] is a 46 y.o. ([birth year]) female who presents for postoperative follow-up for: leftupper arm arteriovenous graftwith Artegraft, ligation of L 1st stage BRVT (Date: 05/09/17). The patient presented with findings consistent with either infected LUA AVG or rejection of the [place]. She was prescribed 10 days of keflex at her last office visit on 06/10/17.   Today, her left arm erythema and tenderness has resolved. She denies any pain in her left arm. She only notes itching.   Physical Examination   Vitals:   08/05/17 1456 08/05/17 1458  BP: (!) 158/93 (!) 161/94  Pulse: 86   Resp: (!) 85   Temp: 97.6 F (36.4 C)   TempSrc: Oral   SpO2: 97%   Weight: 241 lb 12.8 oz (109.7 kg)   Height: 5\' 9"  (1.753 m)    Body mass index is 35.71 kg/m.  LUE: Incision is healed, no erythema or swelling, left upper arm graft is clotted,  hand grip is 5/5, sensation in digits is intact. RUE: palpable right radial and brachial pulses.   Vein mapping 08/05/17  RUE: small cephalic vein conduit. Basilic vein is not visualized.   Medical Decision Making   Heather [name] is a 46 y.o. (09-17-1971) female who presents s/p failed left upper arm arteriovenous graft with Artegraft, ligation of L 1st stage BRVT. Patient not yet on hemodialysis.    Left arm swelling and erythema resolved. No plans to remove graft as symptoms are resolved.   Patient is not a candidate for right arm fistula given inadequate vein conduits. She will need a right arm graft. Given previous issues with Artegraft, would need PTFE graft once patient is approaching dialysis.   Thank you for allowing [name] to participate in this patient's care.  Virgina Jock, PA-C [facility]  Addendum  I have independently interviewed and examined the patient, and I agree with the physician assistant's findings.  L arm is  completely healed.  No certain if patient had rejection of the Artegraft vs infection.  Subsequently, would not place one in the R arm.  No further AVF options at this point.  Given the limited durability of RUA AVG with Goretex, would waiting until in the one month window.     Risk, benefits, and alternatives to access surgery were discussed.    The patient is aware the risks include but are not limited to: bleeding, infection, steal syndrome, nerve damage, ischemic monomelic neuropathy, thrombosis, failure to mature, complications related to venous hypertension, need for additional procedures, death and stroke.    The patient agrees to proceed forward with the procedure.   Adele Barthel, MD, FACS [facility] Office: [phone] Pager: [phone]  08/05/2017, 3:49 PM

## 2017-08-05 ENCOUNTER — Ambulatory Visit (INDEPENDENT_AMBULATORY_CARE_PROVIDER_SITE_OTHER): Payer: Self-pay | Admitting: Vascular Surgery

## 2017-08-05 ENCOUNTER — Encounter: Payer: Self-pay | Admitting: Vascular Surgery

## 2017-08-05 ENCOUNTER — Encounter (HOSPITAL_COMMUNITY)
Admission: RE | Admit: 2017-08-05 | Discharge: 2017-08-05 | Disposition: A | Discharge status: Home / Self Care | Payer: Managed Care, Other (non HMO) | Source: Ambulatory Visit | Attending: Nephrology | Admitting: Nephrology

## 2017-08-05 ENCOUNTER — Ambulatory Visit (HOSPITAL_COMMUNITY)
Admission: RE | Admit: 2017-08-05 | Discharge: 2017-08-05 | Disposition: A | Discharge status: Home / Self Care | Payer: Managed Care, Other (non HMO) | Source: Ambulatory Visit | Attending: Vascular Surgery | Admitting: Vascular Surgery

## 2017-08-05 VITALS — BP 161/94 | HR 86 | Temp 97.6°F | Resp 85 | Ht 69.0 in | Wt 241.8 lb

## 2017-08-05 DIAGNOSIS — N184 Chronic kidney disease, stage 4 (severe): Secondary | ICD-10-CM

## 2017-08-05 DIAGNOSIS — N182 Chronic kidney disease, stage 2 (mild): Secondary | ICD-10-CM | POA: Insufficient documentation

## 2017-08-05 DIAGNOSIS — N185 Chronic kidney disease, stage 5: Secondary | ICD-10-CM | POA: Diagnosis present

## 2017-08-05 LAB — IRON AND TIBC
IRON: 87 ug/dL (ref 28–170)
Saturation Ratios: 41 % — ABNORMAL HIGH (ref 10.4–31.8)
TIBC: 211 ug/dL — ABNORMAL LOW (ref 250–450)
UIBC: 124 ug/dL

## 2017-08-05 LAB — FERRITIN: FERRITIN: 684 ng/mL — AB (ref 11–307)

## 2017-08-05 LAB — POCT HEMOGLOBIN-HEMACUE: HEMOGLOBIN: 10.6 g/dL — AB (ref 12.0–15.0)

## 2017-08-05 MED ORDER — EPOETIN ALFA 40000 UNIT/ML IJ SOLN
40000.0000 [IU] | INTRAMUSCULAR | Status: DC
  Administered 2017-08-05: 40000 [IU] via SUBCUTANEOUS

## 2017-08-05 MED ORDER — EPOETIN ALFA 40000 UNIT/ML IJ SOLN
INTRAMUSCULAR | Status: AC
  Administered 2017-08-05: 40000 [IU] via SUBCUTANEOUS
  Filled 2017-08-05: qty 1

## 2017-08-06 DIAGNOSIS — E11621 Type 2 diabetes mellitus with foot ulcer: Secondary | ICD-10-CM | POA: Insufficient documentation

## 2017-08-06 DIAGNOSIS — K589 Irritable bowel syndrome without diarrhea: Secondary | ICD-10-CM | POA: Insufficient documentation

## 2017-08-18 NOTE — Addendum Note (Signed)
Addendum  created 08/18/17 1428 by Roberts Gaudy, MD   Sign clinical note

## 2017-08-18 NOTE — Addendum Note (Signed)
Addendum  created 08/18/17 1430 by Roberts Gaudy, MD   Sign clinical note

## 2017-08-19 ENCOUNTER — Encounter (HOSPITAL_COMMUNITY)
Admission: RE | Admit: 2017-08-19 | Discharge: 2017-08-19 | Disposition: A | Discharge status: Home / Self Care | Payer: Managed Care, Other (non HMO) | Source: Ambulatory Visit | Attending: Nephrology | Admitting: Nephrology

## 2017-08-19 DIAGNOSIS — N182 Chronic kidney disease, stage 2 (mild): Secondary | ICD-10-CM

## 2017-08-19 LAB — POCT HEMOGLOBIN-HEMACUE: HEMOGLOBIN: 10.8 g/dL — AB (ref 12.0–15.0)

## 2017-08-19 MED ORDER — EPOETIN ALFA 40000 UNIT/ML IJ SOLN
INTRAMUSCULAR | Status: AC
  Filled 2017-08-19: qty 1

## 2017-08-19 MED ORDER — EPOETIN ALFA 40000 UNIT/ML IJ SOLN
40000.0000 [IU] | INTRAMUSCULAR | Status: DC
Start: 2017-08-19 — End: 2017-08-20
  Administered 2017-08-19: 08:00:00 40000 [IU] via SUBCUTANEOUS

## 2017-09-01 ENCOUNTER — Other Ambulatory Visit (HOSPITAL_COMMUNITY): Payer: Self-pay | Admitting: *Deleted

## 2017-09-02 ENCOUNTER — Encounter (HOSPITAL_COMMUNITY)
Admission: RE | Admit: 2017-09-02 | Discharge: 2017-09-02 | Disposition: A | Discharge status: Home / Self Care | Payer: Managed Care, Other (non HMO) | Source: Ambulatory Visit | Attending: Nephrology | Admitting: Nephrology

## 2017-09-02 DIAGNOSIS — N182 Chronic kidney disease, stage 2 (mild): Secondary | ICD-10-CM | POA: Insufficient documentation

## 2017-09-02 LAB — POCT HEMOGLOBIN-HEMACUE: HEMOGLOBIN: 12.1 g/dL (ref 12.0–15.0)

## 2017-09-02 LAB — IRON AND TIBC
IRON: 74 ug/dL (ref 28–170)
Saturation Ratios: 35 % — ABNORMAL HIGH (ref 10.4–31.8)
TIBC: 213 ug/dL — AB (ref 250–450)
UIBC: 139 ug/dL

## 2017-09-02 LAB — FERRITIN: FERRITIN: 624 ng/mL — AB (ref 11–307)

## 2017-09-02 MED ORDER — EPOETIN ALFA 40000 UNIT/ML IJ SOLN: 40000.0000 [IU] | INTRAMUSCULAR | Status: DC

## 2017-09-14 ENCOUNTER — Encounter: Payer: Self-pay | Admitting: Family Medicine

## 2017-09-14 ENCOUNTER — Ambulatory Visit (INDEPENDENT_AMBULATORY_CARE_PROVIDER_SITE_OTHER): Payer: Managed Care, Other (non HMO) | Admitting: Family Medicine

## 2017-09-14 VITALS — BP 160/90 | HR 97 | Temp 98.5°F | Ht 69.0 in | Wt 248.0 lb

## 2017-09-14 DIAGNOSIS — J069 Acute upper respiratory infection, unspecified: Secondary | ICD-10-CM | POA: Diagnosis not present

## 2017-09-14 DIAGNOSIS — B9789 Other viral agents as the cause of diseases classified elsewhere: Secondary | ICD-10-CM

## 2017-09-14 DIAGNOSIS — I10 Essential (primary) hypertension: Secondary | ICD-10-CM

## 2017-09-14 MED ORDER — IPRATROPIUM BROMIDE 0.03 % NA SOLN: 2.0000 | Freq: Two times a day (BID) | NASAL | 0 refills | Status: DC

## 2017-09-14 MED ORDER — BENZONATATE 200 MG PO CAPS: 200.0000 mg | ORAL_CAPSULE | Freq: Two times a day (BID) | ORAL | 0 refills | Status: DC | PRN

## 2017-09-14 NOTE — Patient Instructions (Addendum)
Start the atrovent and tessalon.  Let me know if not better in 5-7 days.  Take care,  Dr Jerline Pain

## 2017-09-14 NOTE — Progress Notes (Signed)
   Subjective:  Heather [name] is a 46 y.o. female who presents today with a chief complaint of lymphadenopathy.   HPI:  Lymphadenopathy, Acute Problem Symptoms started yesterday on left side of neck. Associated symptoms include sore throat, cough, runny nose, and headache. Hurts to swallow. No fevers. No medications tried. No sick contacts. No shortness of breath.   Hypertension, Chronic Problem, Uncontrolled BP Readings from Last 3 Encounters:  09/14/17 (!) 160/90  09/02/17 (!) 189/87  08/19/17 (!) 162/82   Home BP monitoring: Yes, Ranges: 130s-140s/80s Current Medications: amlodipine 10mg  daily, compliant without side effects.  ROS: Denies any chest pain, shortness of breath, dyspnea on exertion, leg edema.   ROS: Per HPI  PMH: Smoking history reviewed. Never smoker.   Objective:  Physical Exam: BP (!) 160/90 (BP Location: Right Arm)   Pulse 97   Temp 98.5 F (36.9 C) (Oral)   Ht 5\' 9"  (1.753 m)   Wt 248 lb (112.5 kg)   SpO2 97%   BMI 36.62 kg/m   Gen: NAD, resting comfortably HEENT: TMs with clear effusion bilaterally. OP clear. Mild LAD along left mandibular area.  CV: RRR with no murmurs appreciated Pulm: NWOB, CTAB with no crackles, wheezes, or rhonchi  Assessment/Plan:  Hypertension Well above goal today. Per patient, her home readings are usually much lower. Will continue amlodipine today. Advised patient to continue checking at home and come back to see PCP if consistently elevated above 140/90.  Viral URI with Cough No signs of bacterial infection. Will prescribe tessalon for cough and atrovent nasal spray. Take tylenol as needed for pain. Strict return precautions reviewed. Follow up as needed. Consider empiric antibiotic if symptoms persist beyond 7-10 day mark.   [name]. Jerline Pain, MD 09/14/2017 10:43 AM

## 2017-09-16 ENCOUNTER — Encounter (HOSPITAL_COMMUNITY)
Admission: RE | Admit: 2017-09-16 | Discharge: 2017-09-16 | Disposition: A | Discharge status: Home / Self Care | Payer: Managed Care, Other (non HMO) | Source: Ambulatory Visit | Attending: Nephrology | Admitting: Nephrology

## 2017-09-16 DIAGNOSIS — N182 Chronic kidney disease, stage 2 (mild): Secondary | ICD-10-CM | POA: Diagnosis not present

## 2017-09-16 LAB — POCT HEMOGLOBIN-HEMACUE: HEMOGLOBIN: 11.4 g/dL — AB (ref 12.0–15.0)

## 2017-09-16 MED ORDER — EPOETIN ALFA 40000 UNIT/ML IJ SOLN
40000.0000 [IU] | INTRAMUSCULAR | Status: DC
Start: 2017-09-16 — End: 2017-09-17
  Administered 2017-09-16: 40000 [IU] via SUBCUTANEOUS

## 2017-09-16 MED ORDER — EPOETIN ALFA 40000 UNIT/ML IJ SOLN
INTRAMUSCULAR | Status: AC
  Filled 2017-09-16: qty 1

## 2017-09-20 ENCOUNTER — Ambulatory Visit (INDEPENDENT_AMBULATORY_CARE_PROVIDER_SITE_OTHER): Payer: Managed Care, Other (non HMO) | Admitting: Family Medicine

## 2017-09-20 ENCOUNTER — Encounter: Payer: Self-pay | Admitting: Family Medicine

## 2017-09-20 VITALS — BP 162/88 | HR 88 | Temp 98.3°F | Ht 69.0 in | Wt 242.2 lb

## 2017-09-20 DIAGNOSIS — J039 Acute tonsillitis, unspecified: Secondary | ICD-10-CM | POA: Diagnosis not present

## 2017-09-20 DIAGNOSIS — J029 Acute pharyngitis, unspecified: Secondary | ICD-10-CM | POA: Diagnosis not present

## 2017-09-20 LAB — POCT RAPID STREP A (OFFICE): Rapid Strep A Screen: NEGATIVE

## 2017-09-20 MED ORDER — DOXYCYCLINE HYCLATE 100 MG PO TABS: 100.0000 mg | ORAL_TABLET | Freq: Two times a day (BID) | ORAL | 0 refills | Status: DC

## 2017-09-20 MED ORDER — HYDROCODONE-ACETAMINOPHEN 5-325 MG PO TABS: 1.0000 | ORAL_TABLET | Freq: Two times a day (BID) | ORAL | 0 refills | Status: DC | PRN

## 2017-09-20 NOTE — Progress Notes (Signed)
Heather [name] is a 46 y.o. female here for an acute visit.  History of Present Illness:   Water quality scientist, CMA, acting as scribe for Dr. Juleen China.  Sore Throat   This is a recurrent problem. The current episode started in the past 7 days. The problem has been gradually worsening. The pain is worse on the left side. There has been no fever. The pain is moderate. Associated symptoms include ear pain, neck pain, swollen glands and trouble swallowing. Pertinent negatives include no drooling, headaches, hoarse voice, shortness of breath, stridor or vomiting. She has tried acetaminophen and gargles for the symptoms. The treatment provided no relief.   PMHx, SurgHx, SocialHx, Medications, and Allergies were reviewed in the Visit Navigator and updated as appropriate.  Current Medications:   .  albuterol (PROVENTIL HFA;VENTOLIN HFA) 108 (90 Base) MCG/ACT inhaler, Inhale 2 puffs into the lungs every 6 (six) hours as needed for wheezing or shortness of breath., Disp: 1 Inhaler, Rfl: 2 .  amLODipine (NORVASC) 10 MG tablet, Take 10 mg by mouth daily., Disp: , Rfl:  .  calcitRIOL (ROCALTROL) 0.25 MCG capsule, Take 0.5 mcg by mouth daily. , Disp: , Rfl:  .  cetirizine (ZYRTEC) 5 MG tablet, 1/2 po daily prn allergic rhinitis, Disp: 24 tablet, Rfl: 0 .  epoetin alfa (EPOGEN,PROCRIT) 23536 UNIT/ML injection, Inject 30,000 Units into the vein every 14 (fourteen) days. , Disp: , Rfl:  .  furosemide (LASIX) 80 MG tablet, Take 1 tablet (80 mg total) by mouth 2 (two) times daily.  [name]  gabapentin (NEURONTIN) 300 MG capsule, Take 1 capsule (300 mg total) by mouth 2 (two) times daily as needed. [name]  glucose blood (ONETOUCH VERIO) test strip, Test 4 times daily. .  insulin aspart (NOVOLOG) 100 UNIT/ML FlexPen, Inject under the skin 15 minutes before meals: 6 units before Breakfast, 8 units before Lunch, 8 units before Supper plus scale BG-150/25 up to 60 units daily, Disp: , Rfl:  .  insulin degludec (TRESIBA FLEXTOUCH) 100  UNIT/ML SOPN FlexTouch Pen, Inject 28 Units into the skin daily with breakfast.  .  ipratropium (ATROVENT) 0.03 % nasal spray, Place 2 sprays into both nostrils every 12 (twelve) hours., Disp: 30 mL, Rfl: 0 .  linaclotide (LINZESS) 145 MCG CAPS capsule, Take 1 capsule (145 mcg total) by mouth daily before breakfast., Disp: 30 capsule, Rfl: 5  Allergies  Allergen Reactions  . Ibuprofen Other (See Comments)    CKD stage 3. Should avoid.  . Dilaudid [Hydromorphone Hcl] Itching and Other (See Comments)    Can take with Benadryl.   Review of Systems:   Pertinent items are noted in the HPI. Otherwise, ROS is negative.  Vitals:   Vitals:   09/20/17 0914  BP: (!) 162/88  Pulse: 88  Temp: 98.3 F (36.8 C)  TempSrc: Oral  SpO2: 98%  Weight: 242 lb 3.2 oz (109.9 kg)  Height: 5\' 9"  (1.753 m)     Body mass index is 35.77 kg/m.   Physical Exam:   Physical Exam  Constitutional: She appears well-nourished.  HENT:  Head: Normocephalic and atraumatic.  Mouth/Throat: Posterior oropharyngeal edema present. Tonsils are 1+ on the left.  Eyes: Pupils are equal, round, and reactive to light. EOM are normal.  Neck: Normal range of motion. Neck supple.  Cardiovascular: Normal rate, regular rhythm, normal heart sounds and intact distal pulses.   Pulmonary/Chest: Effort normal.  Abdominal: Soft.  Skin: Skin is warm.  Psychiatric: She has a normal mood and  affect. Her behavior is normal.  Nursing note and vitals reviewed.   Results for orders placed or performed in visit on 09/20/17  POCT rapid strep A  Result Value Ref Range   Rapid Strep A Screen Negative Negative   Assessment and Plan:   Heather [name] was seen today for acute visit.  Diagnoses and all orders for this visit:  Tonsillitis -     doxycycline (VIBRA-TABS) 100 MG tablet; Take 1 tablet (100 mg total) by mouth 2 (two) times daily. -     HYDROcodone-acetaminophen (NORCO/VICODIN) 5-325 MG tablet; Take 1 tablet by mouth 2 (two)  times daily as needed for moderate pain.  Sore throat -     POCT rapid strep A   . Reviewed expectations re: course of current medical issues. . Discussed self-management of symptoms. . Outlined signs and symptoms indicating need for more acute intervention. . Patient verbalized understanding and all questions were answered. [name] Health Maintenance issues including appropriate healthy diet, exercise, and smoking avoidance were discussed with patient. . See orders for this visit as documented in the electronic medical record. . Patient received an After Visit Summary.  CMA served as Education administrator during this visit. History, Physical, and Plan performed by medical provider. The above documentation has been reviewed and is accurate and complete. Briscoe Deutscher, D.O.  Briscoe Deutscher, DO [place] 09/20/2017  Future Appointments Date Time Provider [place]  09/30/2017 8:00 AM [facility] INJECTION ROOM [facility] None  01/02/2018 8:15 AM Briscoe Deutscher, DO [facility] None

## 2017-09-30 ENCOUNTER — Inpatient Hospital Stay (HOSPITAL_COMMUNITY): Admission: RE | Admit: 2017-09-30 | Payer: Managed Care, Other (non HMO) | Source: Ambulatory Visit

## 2017-10-04 ENCOUNTER — Telehealth: Payer: Self-pay | Admitting: Family Medicine

## 2017-10-04 NOTE — Telephone Encounter (Signed)
Notified patient that she still has refills left at pharmacy. Spoke with pharmacy and patient still has 180 tablets left.

## 2017-10-04 NOTE — Telephone Encounter (Signed)
Patient received Gabapetin from other provider and is out now.  She would like Dr. Juleen China to refill.  [id number] -[address] at [employer], [place]   Please advise.  Ty,  -LL

## 2017-10-17 ENCOUNTER — Ambulatory Visit (INDEPENDENT_AMBULATORY_CARE_PROVIDER_SITE_OTHER): Payer: Managed Care, Other (non HMO) | Admitting: Family Medicine

## 2017-10-17 ENCOUNTER — Encounter: Payer: Self-pay | Admitting: Family Medicine

## 2017-10-17 VITALS — BP 198/72 | HR 88 | Temp 98.1°F | Ht 69.0 in | Wt 238.0 lb

## 2017-10-17 DIAGNOSIS — H6982 Other specified disorders of Eustachian tube, left ear: Secondary | ICD-10-CM | POA: Diagnosis not present

## 2017-10-17 DIAGNOSIS — R05 Cough: Secondary | ICD-10-CM

## 2017-10-17 DIAGNOSIS — R059 Cough, unspecified: Secondary | ICD-10-CM

## 2017-10-17 DIAGNOSIS — H6992 Unspecified Eustachian tube disorder, left ear: Secondary | ICD-10-CM

## 2017-10-17 MED ORDER — FLUTICASONE PROPIONATE 50 MCG/ACT NA SUSP: 2.0000 | Freq: Every day | NASAL | 6 refills | Status: DC

## 2017-10-17 MED ORDER — FLUTICASONE PROPIONATE HFA 220 MCG/ACT IN AERO: 2.0000 | INHALATION_SPRAY | Freq: Every day | RESPIRATORY_TRACT | 12 refills | Status: DC

## 2017-10-21 NOTE — Progress Notes (Signed)
Heather [name] is a 46 y.o. female here for an acute visit.  History of Present Illness:   URI   This is a recurrent problem. The current episode started in the past 7 days. The problem has been gradually worsening. There has been no fever. Associated symptoms include congestion, coughing, rhinorrhea, a sore throat and wheezing. She has tried acetaminophen for the symptoms. The treatment provided no relief.   PMHx, SurgHx, SocialHx, Medications, and Allergies were reviewed in the Visit Navigator and updated as appropriate.  Current Medications:    .  albuterol (PROVENTIL HFA;VENTOLIN HFA) 108 (90 Base) MCG/ACT inhaler, Inhale 2 puffs into the lungs every 6 (six) hours as needed for wheezing or shortness of breath., Disp: 1 Inhaler, Rfl: 2 .  amLODipine (NORVASC) 10 MG tablet, Take 10 mg by mouth daily., Disp: , Rfl:  .  calcitRIOL (ROCALTROL) 0.25 MCG capsule, Take 0.5 mcg by mouth daily. , Disp: , Rfl:  .  cetirizine (ZYRTEC) 5 MG tablet, 1/2 po daily prn allergic rhinitis, Disp: 24 tablet, Rfl: 0 .  Continuous Blood Gluc Sensor ([place]) MISC, by Does not apply route., Disp: , Rfl:  .  doxycycline (VIBRA-TABS) 100 MG tablet, Take 1 tablet (100 mg total) by mouth 2 (two) times daily., Disp: 20 tablet, Rfl: 0 .  epoetin alfa (EPOGEN,PROCRIT) 64332 UNIT/ML injection, Inject 30,000 Units into the vein every 14 (fourteen) days. , Disp: , Rfl:  .  furosemide (LASIX) 80 MG tablet, Take 1 tablet (80 mg total) by mouth 2 (two) times daily. (Patient taking differently: Take 160 mg by mouth 2 (two) times daily. ), Disp: 60 tablet, Rfl: 0 .  gabapentin (NEURONTIN) 300 MG capsule, Take 1 capsule (300 mg total) by mouth 2 (two) times daily as needed., Disp: 60 capsule, Rfl: 3 .  glucose blood (ONETOUCH VERIO) test strip, Test 4 times daily. (Patient taking differently: 1 each by Other route 3 (three) times daily. ), Disp: 100 each, Rfl: 12 .  HYDROcodone-acetaminophen  (NORCO/VICODIN) 5-325 MG tablet, Take 1 tablet by mouth 2 (two) times daily as needed for moderate pain., Disp: 20 tablet, Rfl: 0 .  insulin aspart (NOVOLOG) 100 UNIT/ML FlexPen, Inject under the skin 15 minutes before meals: 6 units before Breakfast, 8 units before Lunch, 8 units before Supper plus scale BG-150/25 up to 60 units daily, Disp: , Rfl:  .  insulin degludec (TRESIBA FLEXTOUCH) 100 UNIT/ML SOPN FlexTouch Pen, Inject 28 Units into the skin daily with breakfast. , Disp: , Rfl:  .  linaclotide (LINZESS) 145 MCG CAPS capsule, Take 1 capsule (145 mcg total) by mouth daily before breakfast., Disp: 30 capsule, Rfl: 5  Allergies  Allergen Reactions  . Ibuprofen Other (See Comments)    CKD stage 3. Should avoid.  . Dilaudid [Hydromorphone Hcl] Itching and Other (See Comments)    Can take with Benadryl.   Review of Systems:   Pertinent items are noted in the HPI. Otherwise, ROS is negative.  Vitals:   Vitals:   10/17/17 0908  BP: (!) 198/72  Pulse: 88  Temp: 98.1 F (36.7 C)  TempSrc: Oral  SpO2: 99%  Weight: 238 lb (108 kg)  Height: 5\' 9"  (1.753 m)     Body mass index is 35.15 kg/m.   Physical Exam:   Physical Exam  Constitutional: She appears well-nourished.  HENT:  Head: Normocephalic and atraumatic.  Nose: Mucosal edema and rhinorrhea present.  Eyes: Pupils are equal, round, and reactive to light.  EOM are normal.  Neck: Normal range of motion. Neck supple.  Cardiovascular: Normal rate, regular rhythm, normal heart sounds and intact distal pulses.   Pulmonary/Chest: Effort normal. She has wheezes.  Abdominal: Soft.  Skin: Skin is warm.  Psychiatric: She has a normal mood and affect. Her behavior is normal.  Nursing note and vitals reviewed.   Assessment and Plan:   Heather [name] was seen today for otalgia, cough, headache and fever.  Diagnoses and all orders for this visit:  Dysfunction of left eustachian tube -     fluticasone (FLONASE) 50 MCG/ACT nasal spray;  Place 2 sprays into both nostrils daily.  Cough -     fluticasone (FLOVENT HFA) 220 MCG/ACT inhaler; Inhale 2 puffs into the lungs daily.   . Reviewed expectations re: course of current medical issues. . Discussed self-management of symptoms. . Outlined signs and symptoms indicating need for more acute intervention. . Patient verbalized understanding and all questions were answered. [name] Health Maintenance issues including appropriate healthy diet, exercise, and smoking avoidance were discussed with patient. . See orders for this visit as documented in the electronic medical record. . Patient received an After Visit Summary.  Briscoe Deutscher, DO [place] 10/21/2017  Future Appointments Date Time Provider [place]  01/02/2018 8:20 AM Briscoe Deutscher, DO [facility] None

## 2017-10-24 DIAGNOSIS — M26621 Arthralgia of right temporomandibular joint: Secondary | ICD-10-CM | POA: Insufficient documentation

## 2017-10-27 ENCOUNTER — Encounter (HOSPITAL_COMMUNITY)
Admission: RE | Admit: 2017-10-27 | Discharge: 2017-10-27 | Disposition: A | Discharge status: Home / Self Care | Payer: Managed Care, Other (non HMO) | Source: Ambulatory Visit | Attending: Nephrology | Admitting: Nephrology

## 2017-10-27 DIAGNOSIS — N182 Chronic kidney disease, stage 2 (mild): Secondary | ICD-10-CM | POA: Insufficient documentation

## 2017-10-27 LAB — IRON AND TIBC
Iron: 65 ug/dL (ref 28–170)
SATURATION RATIOS: 33 % — AB (ref 10.4–31.8)
TIBC: 196 ug/dL — AB (ref 250–450)
UIBC: 131 ug/dL

## 2017-10-27 LAB — FERRITIN: Ferritin: 749 ng/mL — ABNORMAL HIGH (ref 11–307)

## 2017-10-27 LAB — POCT HEMOGLOBIN-HEMACUE: HEMOGLOBIN: 9.4 g/dL — AB (ref 12.0–15.0)

## 2017-10-27 MED ORDER — EPOETIN ALFA 40000 UNIT/ML IJ SOLN
INTRAMUSCULAR | Status: AC
  Filled 2017-10-27: qty 1

## 2017-10-27 MED ORDER — EPOETIN ALFA 40000 UNIT/ML IJ SOLN
40000.0000 [IU] | INTRAMUSCULAR | Status: DC
  Administered 2017-10-27: 40000 [IU] via SUBCUTANEOUS

## 2017-11-01 ENCOUNTER — Other Ambulatory Visit: Payer: Self-pay

## 2017-11-01 ENCOUNTER — Other Ambulatory Visit: Payer: Self-pay | Admitting: Family Medicine

## 2017-11-01 DIAGNOSIS — H6982 Other specified disorders of Eustachian tube, left ear: Secondary | ICD-10-CM

## 2017-11-01 MED ORDER — FLUTICASONE PROPIONATE 50 MCG/ACT NA SUSP: 2.0000 | Freq: Every day | NASAL | 1 refills | Status: DC

## 2017-11-02 ENCOUNTER — Other Ambulatory Visit: Payer: Self-pay

## 2017-11-02 DIAGNOSIS — E1140 Type 2 diabetes mellitus with diabetic neuropathy, unspecified: Secondary | ICD-10-CM

## 2017-11-02 MED ORDER — GABAPENTIN 300 MG PO CAPS: 300.0000 mg | ORAL_CAPSULE | Freq: Two times a day (BID) | ORAL | 3 refills | Status: DC | PRN

## 2017-11-10 ENCOUNTER — Encounter (HOSPITAL_COMMUNITY): Payer: Managed Care, Other (non HMO)

## 2017-11-14 ENCOUNTER — Encounter (HOSPITAL_COMMUNITY)
Admission: RE | Admit: 2017-11-14 | Discharge: 2017-11-14 | Disposition: A | Discharge status: Home / Self Care | Payer: Managed Care, Other (non HMO) | Source: Ambulatory Visit | Attending: Nephrology | Admitting: Nephrology

## 2017-11-14 VITALS — BP 171/83 | HR 91 | Temp 98.4°F | Resp 18

## 2017-11-14 DIAGNOSIS — N182 Chronic kidney disease, stage 2 (mild): Secondary | ICD-10-CM | POA: Diagnosis not present

## 2017-11-14 LAB — POCT HEMOGLOBIN-HEMACUE: HEMOGLOBIN: 10 g/dL — AB (ref 12.0–15.0)

## 2017-11-14 MED ORDER — EPOETIN ALFA 40000 UNIT/ML IJ SOLN
40000.0000 [IU] | INTRAMUSCULAR | Status: DC
  Administered 2017-11-14: 40000 [IU] via SUBCUTANEOUS

## 2017-11-14 MED ORDER — EPOETIN ALFA 40000 UNIT/ML IJ SOLN
INTRAMUSCULAR | Status: AC
  Administered 2017-11-14: 10:00:00 40000 [IU] via SUBCUTANEOUS
  Filled 2017-11-14: qty 1

## 2017-11-24 DIAGNOSIS — H6982 Other specified disorders of Eustachian tube, left ear: Secondary | ICD-10-CM

## 2017-11-24 DIAGNOSIS — H6992 Unspecified Eustachian tube disorder, left ear: Secondary | ICD-10-CM

## 2017-11-24 HISTORY — DX: Unspecified Eustachian tube disorder, left ear: H69.92

## 2017-11-24 HISTORY — DX: Other specified disorders of eustachian tube, left ear: H69.82

## 2017-11-28 ENCOUNTER — Encounter (HOSPITAL_COMMUNITY)
Admission: RE | Admit: 2017-11-28 | Discharge: 2017-11-28 | Disposition: A | Discharge status: Home / Self Care | Payer: Managed Care, Other (non HMO) | Source: Ambulatory Visit | Attending: Nephrology | Admitting: Nephrology

## 2017-11-28 VITALS — BP 180/95 | HR 88 | Resp 18

## 2017-11-28 DIAGNOSIS — N182 Chronic kidney disease, stage 2 (mild): Secondary | ICD-10-CM | POA: Insufficient documentation

## 2017-11-28 LAB — POCT HEMOGLOBIN-HEMACUE: HEMOGLOBIN: 9.6 g/dL — AB (ref 12.0–15.0)

## 2017-11-28 MED ORDER — EPOETIN ALFA 40000 UNIT/ML IJ SOLN
INTRAMUSCULAR | Status: AC
  Filled 2017-11-28: qty 1

## 2017-11-28 MED ORDER — EPOETIN ALFA 40000 UNIT/ML IJ SOLN
40000.0000 [IU] | INTRAMUSCULAR | Status: DC
  Administered 2017-11-28: 40000 [IU] via SUBCUTANEOUS

## 2017-12-02 ENCOUNTER — Emergency Department (HOSPITAL_BASED_OUTPATIENT_CLINIC_OR_DEPARTMENT_OTHER): Payer: Managed Care, Other (non HMO)

## 2017-12-02 ENCOUNTER — Encounter (HOSPITAL_BASED_OUTPATIENT_CLINIC_OR_DEPARTMENT_OTHER): Payer: Self-pay | Admitting: *Deleted

## 2017-12-02 ENCOUNTER — Other Ambulatory Visit: Payer: Self-pay

## 2017-12-02 ENCOUNTER — Inpatient Hospital Stay (HOSPITAL_BASED_OUTPATIENT_CLINIC_OR_DEPARTMENT_OTHER)
Admission: EM | Admit: 2017-12-02 | Discharge: 2017-12-06 | DRG: 041 | Disposition: A | Discharge status: Home / Self Care | Payer: Managed Care, Other (non HMO) | Attending: Internal Medicine | Admitting: Internal Medicine

## 2017-12-02 ENCOUNTER — Inpatient Hospital Stay (HOSPITAL_BASED_OUTPATIENT_CLINIC_OR_DEPARTMENT_OTHER): Payer: Managed Care, Other (non HMO)

## 2017-12-02 DIAGNOSIS — N2581 Secondary hyperparathyroidism of renal origin: Secondary | ICD-10-CM | POA: Diagnosis present

## 2017-12-02 DIAGNOSIS — E1122 Type 2 diabetes mellitus with diabetic chronic kidney disease: Secondary | ICD-10-CM | POA: Diagnosis present

## 2017-12-02 DIAGNOSIS — Z6834 Body mass index (BMI) 34.0-34.9, adult: Secondary | ICD-10-CM

## 2017-12-02 DIAGNOSIS — Z888 Allergy status to other drugs, medicaments and biological substances status: Secondary | ICD-10-CM

## 2017-12-02 DIAGNOSIS — Z8249 Family history of ischemic heart disease and other diseases of the circulatory system: Secondary | ICD-10-CM | POA: Diagnosis not present

## 2017-12-02 DIAGNOSIS — R519 Headache, unspecified: Secondary | ICD-10-CM

## 2017-12-02 DIAGNOSIS — I6300 Cerebral infarction due to thrombosis of unspecified precerebral artery: Secondary | ICD-10-CM

## 2017-12-02 DIAGNOSIS — I6789 Other cerebrovascular disease: Secondary | ICD-10-CM | POA: Diagnosis not present

## 2017-12-02 DIAGNOSIS — I160 Hypertensive urgency: Secondary | ICD-10-CM | POA: Diagnosis present

## 2017-12-02 DIAGNOSIS — Z79899 Other long term (current) drug therapy: Secondary | ICD-10-CM | POA: Diagnosis not present

## 2017-12-02 DIAGNOSIS — R51 Headache: Secondary | ICD-10-CM | POA: Diagnosis not present

## 2017-12-02 DIAGNOSIS — N185 Chronic kidney disease, stage 5: Secondary | ICD-10-CM | POA: Diagnosis present

## 2017-12-02 DIAGNOSIS — E1151 Type 2 diabetes mellitus with diabetic peripheral angiopathy without gangrene: Secondary | ICD-10-CM | POA: Diagnosis present

## 2017-12-02 DIAGNOSIS — Z885 Allergy status to narcotic agent status: Secondary | ICD-10-CM | POA: Diagnosis not present

## 2017-12-02 DIAGNOSIS — E1169 Type 2 diabetes mellitus with other specified complication: Secondary | ICD-10-CM | POA: Diagnosis present

## 2017-12-02 DIAGNOSIS — D638 Anemia in other chronic diseases classified elsewhere: Secondary | ICD-10-CM | POA: Diagnosis not present

## 2017-12-02 DIAGNOSIS — Z8673 Personal history of transient ischemic attack (TIA), and cerebral infarction without residual deficits: Secondary | ICD-10-CM | POA: Diagnosis present

## 2017-12-02 DIAGNOSIS — Z833 Family history of diabetes mellitus: Secondary | ICD-10-CM

## 2017-12-02 DIAGNOSIS — I6389 Other cerebral infarction: Secondary | ICD-10-CM | POA: Diagnosis not present

## 2017-12-02 DIAGNOSIS — I10 Essential (primary) hypertension: Secondary | ICD-10-CM

## 2017-12-02 DIAGNOSIS — I152 Hypertension secondary to endocrine disorders: Secondary | ICD-10-CM | POA: Diagnosis present

## 2017-12-02 DIAGNOSIS — N189 Chronic kidney disease, unspecified: Secondary | ICD-10-CM | POA: Diagnosis present

## 2017-12-02 DIAGNOSIS — E1140 Type 2 diabetes mellitus with diabetic neuropathy, unspecified: Secondary | ICD-10-CM | POA: Diagnosis present

## 2017-12-02 DIAGNOSIS — I120 Hypertensive chronic kidney disease with stage 5 chronic kidney disease or end stage renal disease: Secondary | ICD-10-CM | POA: Diagnosis present

## 2017-12-02 DIAGNOSIS — N186 End stage renal disease: Secondary | ICD-10-CM | POA: Diagnosis present

## 2017-12-02 DIAGNOSIS — R1310 Dysphagia, unspecified: Secondary | ICD-10-CM | POA: Diagnosis present

## 2017-12-02 DIAGNOSIS — I672 Cerebral atherosclerosis: Secondary | ICD-10-CM | POA: Diagnosis present

## 2017-12-02 DIAGNOSIS — I6340 Cerebral infarction due to embolism of unspecified cerebral artery: Principal | ICD-10-CM | POA: Diagnosis present

## 2017-12-02 DIAGNOSIS — E1159 Type 2 diabetes mellitus with other circulatory complications: Secondary | ICD-10-CM | POA: Diagnosis present

## 2017-12-02 DIAGNOSIS — E785 Hyperlipidemia, unspecified: Secondary | ICD-10-CM | POA: Diagnosis present

## 2017-12-02 DIAGNOSIS — D631 Anemia in chronic kidney disease: Secondary | ICD-10-CM | POA: Diagnosis present

## 2017-12-02 DIAGNOSIS — I639 Cerebral infarction, unspecified: Secondary | ICD-10-CM | POA: Diagnosis not present

## 2017-12-02 DIAGNOSIS — Z794 Long term (current) use of insulin: Secondary | ICD-10-CM

## 2017-12-02 DIAGNOSIS — E119 Type 2 diabetes mellitus without complications: Secondary | ICD-10-CM | POA: Diagnosis not present

## 2017-12-02 DIAGNOSIS — G464 Cerebellar stroke syndrome: Secondary | ICD-10-CM | POA: Diagnosis not present

## 2017-12-02 LAB — CBC WITH DIFFERENTIAL/PLATELET
Basophils Absolute: 0 10*3/uL (ref 0.0–0.1)
Basophils Relative: 0 %
EOS ABS: 0.1 10*3/uL (ref 0.0–0.7)
EOS PCT: 1 %
HCT: 32.9 % — ABNORMAL LOW (ref 36.0–46.0)
Hemoglobin: 10.3 g/dL — ABNORMAL LOW (ref 12.0–15.0)
LYMPHS ABS: 1.8 10*3/uL (ref 0.7–4.0)
Lymphocytes Relative: 20 %
MCH: 29.4 pg (ref 26.0–34.0)
MCHC: 31.3 g/dL (ref 30.0–36.0)
MCV: 94 fL (ref 78.0–100.0)
MONOS PCT: 8 %
Monocytes Absolute: 0.7 10*3/uL (ref 0.1–1.0)
Neutro Abs: 6.3 10*3/uL (ref 1.7–7.7)
Neutrophils Relative %: 71 %
PLATELETS: 358 10*3/uL (ref 150–400)
RBC: 3.5 MIL/uL — ABNORMAL LOW (ref 3.87–5.11)
RDW: 13.5 % (ref 11.5–15.5)
WBC: 8.9 10*3/uL (ref 4.0–10.5)

## 2017-12-02 LAB — BASIC METABOLIC PANEL
Anion gap: 7 (ref 5–15)
BUN: 41 mg/dL — AB (ref 6–20)
CHLORIDE: 104 mmol/L (ref 101–111)
CO2: 25 mmol/L (ref 22–32)
CREATININE: 4.93 mg/dL — AB (ref 0.44–1.00)
Calcium: 8.3 mg/dL — ABNORMAL LOW (ref 8.9–10.3)
GFR calc Af Amer: 11 mL/min — ABNORMAL LOW (ref >60)
GFR calc non Af Amer: 10 mL/min — ABNORMAL LOW (ref >60)
GLUCOSE: 118 mg/dL — AB (ref 65–99)
Potassium: 3.5 mmol/L (ref 3.5–5.1)
SODIUM: 136 mmol/L (ref 135–145)

## 2017-12-02 MED ORDER — LABETALOL HCL 5 MG/ML IV SOLN
5.0000 mg | INTRAVENOUS | Status: DC | PRN
  Filled 2017-12-02: qty 4

## 2017-12-02 MED ORDER — FUROSEMIDE 10 MG/ML IJ SOLN
80.0000 mg | Freq: Two times a day (BID) | INTRAMUSCULAR | Status: DC
  Administered 2017-12-02 – 2017-12-04 (×4): 80 mg via INTRAVENOUS
  Filled 2017-12-02 (×4): qty 8

## 2017-12-02 MED ORDER — STROKE: EARLY STAGES OF RECOVERY BOOK: Freq: Once | Status: DC

## 2017-12-02 MED ORDER — LISINOPRIL 20 MG PO TABS: 20.0000 mg | ORAL_TABLET | Freq: Every day | ORAL | Status: DC

## 2017-12-02 MED ORDER — INSULIN GLARGINE 100 UNIT/ML ~~LOC~~ SOLN
25.0000 [IU] | Freq: Every day | SUBCUTANEOUS | Status: DC
  Administered 2017-12-03 – 2017-12-05 (×3): 25 [IU] via SUBCUTANEOUS
  Filled 2017-12-02 (×4): qty 0.25

## 2017-12-02 MED ORDER — ASPIRIN 300 MG RE SUPP: 300.0000 mg | Freq: Every day | RECTAL | Status: DC

## 2017-12-02 MED ORDER — ACETAMINOPHEN 650 MG RE SUPP: 650.0000 mg | RECTAL | Status: DC | PRN

## 2017-12-02 MED ORDER — ALBUTEROL SULFATE HFA 108 (90 BASE) MCG/ACT IN AERS: 2.0000 | INHALATION_SPRAY | Freq: Four times a day (QID) | RESPIRATORY_TRACT | Status: DC | PRN

## 2017-12-02 MED ORDER — GABAPENTIN 300 MG PO CAPS: 300.0000 mg | ORAL_CAPSULE | Freq: Two times a day (BID) | ORAL | Status: DC | PRN

## 2017-12-02 MED ORDER — IPRATROPIUM BROMIDE 0.03 % NA SOLN: 2.0000 | Freq: Two times a day (BID) | NASAL | Status: DC

## 2017-12-02 MED ORDER — SODIUM CHLORIDE 0.9 % IV BOLUS (SEPSIS)
500.0000 mL | Freq: Once | INTRAVENOUS | Status: AC
  Administered 2017-12-02: 500 mL via INTRAVENOUS

## 2017-12-02 MED ORDER — INSULIN DEGLUDEC 100 UNIT/ML ~~LOC~~ SOPN: 28.0000 [IU] | PEN_INJECTOR | Freq: Every day | SUBCUTANEOUS | Status: DC

## 2017-12-02 MED ORDER — ACETAMINOPHEN 325 MG PO TABS
650.0000 mg | ORAL_TABLET | ORAL | Status: DC | PRN
  Administered 2017-12-05 (×2): 650 mg via ORAL
  Filled 2017-12-02 (×2): qty 2

## 2017-12-02 MED ORDER — OXYCODONE HCL 5 MG PO TABS: 5.0000 mg | ORAL_TABLET | ORAL | Status: DC | PRN

## 2017-12-02 MED ORDER — MORPHINE SULFATE (PF) 4 MG/ML IV SOLN
4.0000 mg | Freq: Once | INTRAVENOUS | Status: AC
  Administered 2017-12-02: 4 mg via INTRAVENOUS
  Filled 2017-12-02: qty 1

## 2017-12-02 MED ORDER — HYDROMORPHONE HCL 1 MG/ML IJ SOLN
0.5000 mg | Freq: Once | INTRAMUSCULAR | Status: AC
  Administered 2017-12-02: 0.5 mg via INTRAVENOUS
  Filled 2017-12-02: qty 1

## 2017-12-02 MED ORDER — FUROSEMIDE 80 MG PO TABS: 160.0000 mg | ORAL_TABLET | Freq: Two times a day (BID) | ORAL | Status: DC

## 2017-12-02 MED ORDER — BUDESONIDE 0.5 MG/2ML IN SUSP
0.5000 mg | Freq: Two times a day (BID) | RESPIRATORY_TRACT | Status: DC
  Administered 2017-12-03 – 2017-12-06 (×6): 0.5 mg via RESPIRATORY_TRACT
  Filled 2017-12-02 (×7): qty 2

## 2017-12-02 MED ORDER — TRAZODONE HCL 50 MG PO TABS: 25.0000 mg | ORAL_TABLET | Freq: Every evening | ORAL | Status: DC | PRN

## 2017-12-02 MED ORDER — FLUTICASONE PROPIONATE 50 MCG/ACT NA SUSP: 2.0000 | Freq: Every day | NASAL | Status: DC

## 2017-12-02 MED ORDER — AMLODIPINE BESYLATE 10 MG PO TABS: 10.0000 mg | ORAL_TABLET | Freq: Every day | ORAL | Status: DC

## 2017-12-02 MED ORDER — ACETAMINOPHEN 325 MG PO TABS: 650.0000 mg | ORAL_TABLET | Freq: Four times a day (QID) | ORAL | Status: DC | PRN

## 2017-12-02 MED ORDER — DIPHENHYDRAMINE HCL 50 MG/ML IJ SOLN
12.5000 mg | Freq: Once | INTRAMUSCULAR | Status: AC
  Administered 2017-12-02: 12.5 mg via INTRAVENOUS
  Filled 2017-12-02: qty 1

## 2017-12-02 MED ORDER — HEPARIN SODIUM (PORCINE) 5000 UNIT/ML IJ SOLN: 5000.0000 [IU] | Freq: Three times a day (TID) | INTRAMUSCULAR | Status: DC

## 2017-12-02 MED ORDER — HEPARIN SODIUM (PORCINE) 5000 UNIT/ML IJ SOLN
5000.0000 [IU] | Freq: Three times a day (TID) | INTRAMUSCULAR | Status: DC
  Administered 2017-12-02 – 2017-12-06 (×11): 5000 [IU] via SUBCUTANEOUS
  Filled 2017-12-02 (×11): qty 1

## 2017-12-02 MED ORDER — CALCITRIOL 0.5 MCG PO CAPS: 0.5000 ug | ORAL_CAPSULE | Freq: Every day | ORAL | Status: DC

## 2017-12-02 MED ORDER — ACETAMINOPHEN 160 MG/5ML PO SOLN
650.0000 mg | ORAL | Status: DC | PRN
Start: 2017-12-02 — End: 2017-12-05

## 2017-12-02 MED ORDER — FLUTICASONE PROPIONATE HFA 220 MCG/ACT IN AERO: 2.0000 | INHALATION_SPRAY | Freq: Every day | RESPIRATORY_TRACT | Status: DC

## 2017-12-02 MED ORDER — INSULIN ASPART 100 UNIT/ML ~~LOC~~ SOLN
0.0000 [IU] | SUBCUTANEOUS | Status: DC
  Administered 2017-12-03: 2 [IU] via SUBCUTANEOUS
  Administered 2017-12-03: 1 [IU] via SUBCUTANEOUS

## 2017-12-02 MED ORDER — ACETAMINOPHEN 650 MG RE SUPP: 650.0000 mg | Freq: Four times a day (QID) | RECTAL | Status: DC | PRN

## 2017-12-02 MED ORDER — ASPIRIN 325 MG PO TABS: 325.0000 mg | ORAL_TABLET | Freq: Every day | ORAL | Status: DC

## 2017-12-02 MED ORDER — STROKE: EARLY STAGES OF RECOVERY BOOK
Freq: Once | Status: AC
  Administered 2017-12-02: 22:00:00

## 2017-12-02 MED ORDER — LINACLOTIDE 145 MCG PO CAPS: 145.0000 ug | ORAL_CAPSULE | Freq: Every day | ORAL | Status: DC

## 2017-12-02 MED ORDER — MORPHINE SULFATE (PF) 2 MG/ML IV SOLN
2.0000 mg | INTRAVENOUS | Status: DC | PRN
  Administered 2017-12-02 – 2017-12-05 (×6): 2 mg via INTRAVENOUS
  Filled 2017-12-02 (×6): qty 1

## 2017-12-02 MED ORDER — SODIUM CHLORIDE 0.9% FLUSH: 3.0000 mL | Freq: Two times a day (BID) | INTRAVENOUS | Status: DC

## 2017-12-02 MED ORDER — ASPIRIN 300 MG RE SUPP
300.0000 mg | Freq: Every day | RECTAL | Status: DC
  Administered 2017-12-02: 300 mg via RECTAL
  Filled 2017-12-02 (×2): qty 1

## 2017-12-02 MED ORDER — SENNOSIDES-DOCUSATE SODIUM 8.6-50 MG PO TABS
1.0000 | ORAL_TABLET | Freq: Every evening | ORAL | Status: DC | PRN
  Administered 2017-12-04: 1 via ORAL
  Filled 2017-12-02: qty 1

## 2017-12-02 MED ORDER — ASPIRIN 325 MG PO TABS
325.0000 mg | ORAL_TABLET | Freq: Every day | ORAL | Status: DC
  Administered 2017-12-03 – 2017-12-05 (×3): 325 mg via ORAL
  Filled 2017-12-02 (×3): qty 1

## 2017-12-02 NOTE — ED Notes (Signed)
[facility] in route to transport pt.

## 2017-12-02 NOTE — ED Notes (Signed)
Family at bedside. 

## 2017-12-02 NOTE — Plan of Care (Addendum)
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  She presented to [facility] today complains of a headache that started 40 minutes prior to presentation.  No evidence of volume overload.  Upon presentation blood pressure was noted to be 200/114 and has since then come down to 177/103.  Evaluation revealed no neurological deficits and a CT scan of the head showed a new subacute infarction in the right cerebellum possibly subacute.  Dr. Malen Gauze from neurology spoke with the ER provider and recommended transfer to [facility] for MR, CT angiogram and further evaluation and management.  She has been accepted to the [facility] hospital[facility].  Meds have been reconciled and holding orders have been placed including order for MRI of the head and CT angiogram of the head and neck.

## 2017-12-02 NOTE — Consult Note (Signed)
Referring Physician: Dr. Myna Hidalgo    Chief Complaint: Severe headache. CT shows age indeterminate right cerebellar infarction  HPI: Heather [name] is an 46 y.o. female with stage V ESRD and a remote history of migraines who presents with severe unilateral throbbing headache. She states that the headache does not feel like her usual migraine. Headache began suddenly on Wednesday evening with 10/10 throbbing pain to the left side of her head. The pain was so severe that she could not get out of bed to call her husband. She denies having had any associated weakness, confusion or incoordination. Also without neck pain. She states that the headache did not feel like one of the migraines she had had in her remote past, despite its throbbing quality and unilaterality. She had no associated N/V, no photophobia, sonophobia or osmophobia and no neck stiffness. Also without fever or chills. She slept through the pain and by Thursday morning, the pain had improved to about 5/10 and she was able to go to work. Thursday evening, she went to bed, still with the headache.   On Friday morning, she awoke without a headache. However, later on she had sudden recurrence of the headache, which was again severe, so she went to the ED at [facility] [place] for further evaluation. Upon presentation blood pressure was noted to be 200/114 and has since then came down to 177/103. Evaluation revealed no neurological deficits. 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 She was then transported to [facility] for further assessment, including MRI brain and vessel imaging. Currently, her headache is significantly better, rated at about 2/10.  PMHx includes new graft in place pending dialysis, secondary hyperparathyroidism, DM with peripheral neuropathy, morbid obesity, HTN and HLD.  Past Medical History:  Diagnosis Date  . Allergy   . Anemia of chronic disease   . B12 deficiency 05/10/2014   . Blood transfusion without reported diagnosis   . Bronchitis   . Chronic constipation   . Chronic kidney disease   . Constipation   . Diabetic neuropathy, painful ([place]) 07/13/2013   Stop lyrica due to cost & SE of drowsy. Gabapentin helps Stay off hydrocodone: pt states she thinks "she was addicted" to med. "I don't want that anymore."   . DM2 (diabetes mellitus, type 2) ([place])   . Dyspnea    with exertion  . Fibromyalgia   . Gastroparesis due to DM   . GERD (gastroesophageal reflux disease)   . Headache   . Heart murmur, systolic 27/0/6237   Grade 1/6 ejection murmur per [facility] notes.   . Hyperlipemia   . Hypertension   . IBS (irritable bowel syndrome)   . Infertility   . Leiomyoma of uterus   . Morbid obesity due to excess calories ([place]) 11/16/2016  . Neuropathy   . Obesity   . Pancreatitis   . 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 Right-sided low back pain without sciatica 12/08/2015  . Secondary hyperthyroidism 11/27/2013   Followed by [facility], notes reviewed 01/01/15. PTH had improved with oral vitamin D: 133 to 85. Last 155-probably not taking vit D.    [name] Upper airway cough syndrome 11/15/2016   rec off acei/ on pepcid hs 11/15/2016 >>>     Past Surgical History:  Procedure Laterality Date  . AV FISTULA PLACEMENT Left 05/09/2017   Procedure: INSERTION OF ARTERIOVENOUS (AV) GRAFT ARM (ARTEGRAFT);  Surgeon: Conrad Itasca, MD;  Location:  [facility] OR;  Service: Vascular;  Laterality: Left;  . BASCILIC VEIN TRANSPOSITION Left 08/31/2016   Procedure: BASILIC VEIN TRANSPOSITION FIRST STAGE;  Surgeon: Conrad Tehama, MD;  Location: [place];  Service: Vascular;  Laterality: Left;  . FOOT SURGERY Right    t"ook bone out- maybe hammer toe"  . LIGATION OF ARTERIOVENOUS  FISTULA Left 05/09/2017   Procedure: LIGATION OF ARTERIOVENOUS  FISTULA;  Surgeon: Conrad Knippa, MD;  Location: [place];  Service: Vascular;  Laterality: Left;  . MYOMECTOMY    . VITRECTOMY  Bilateral     Family History  Problem Relation Age of Onset  . Colon polyps Mother   . Diabetes Mother   . Heart murmur Father   . Hypertension Father   . Diabetes Sister   . Kidney failure Sister   . Diabetes Brother   . Retinal degeneration Brother   . Heart murmur Son   . Stroke Paternal Grandmother   . Diabetes Sister    Social History:  reports that  has never smoked. she has never used smokeless tobacco. She reports that she does not drink alcohol or use drugs.  Allergies:  Allergies  Allergen Reactions  . Ibuprofen Other (See Comments)    CKD stage 3. Should avoid.  . Dilaudid [Hydromorphone Hcl] Itching and Other (See Comments)    Can take with Benadryl.    Medications:  Prior to Admission:  Medications Prior to Admission  Medication Sig Dispense Refill Last Dose  . lisinopril (PRINIVIL,ZESTRIL) 20 MG tablet Take 20 mg by mouth daily.     [name] albuterol (PROVENTIL HFA;VENTOLIN HFA) 108 (90 Base) MCG/ACT inhaler Inhale 2 puffs into the lungs every 6 (six) hours as needed for wheezing or shortness of breath. 1 Inhaler 2 Taking  . amLODipine (NORVASC) 10 MG tablet Take 10 mg by mouth daily.   Taking  . calcitRIOL (ROCALTROL) 0.25 MCG capsule Take 0.5 mcg by mouth daily.    Taking  . cetirizine (ZYRTEC) 5 MG tablet 1/2 po daily prn allergic rhinitis 24 tablet 0 Taking  . Continuous Blood Gluc Sensor ([place]) MISC by Does not apply route.   Taking  . epoetin alfa (EPOGEN,PROCRIT) 37106 UNIT/ML injection Inject 30,000 Units into the vein every 14 (fourteen) days.    Taking  . fluticasone (FLONASE) 50 MCG/ACT nasal spray Place 2 sprays daily into both nostrils. 48 g 1   . fluticasone (FLOVENT HFA) 220 MCG/ACT inhaler Inhale 2 puffs into the lungs daily. 1 Inhaler 12   . furosemide (LASIX) 80 MG tablet Take 1 tablet (80 mg total) by mouth 2 (two) times daily. (Patient taking differently: Take 160 mg by mouth 2 (two) times daily. ) 60 tablet 0 Taking  .  gabapentin (NEURONTIN) 300 MG capsule Take 1 capsule (300 mg total) 2 (two) times daily as needed by mouth. 60 capsule 3   . glucose blood (ONETOUCH VERIO) test strip Test 4 times daily. (Patient taking differently: 1 each by Other route 3 (three) times daily. ) 100 each 12 Taking  . insulin aspart (NOVOLOG) 100 UNIT/ML FlexPen Inject under the skin 15 minutes before meals: 6 units before Breakfast, 8 units before Lunch, 8 units before Supper plus scale BG-150/25 up to 60 units daily   Taking  . insulin degludec (TRESIBA FLEXTOUCH) 100 UNIT/ML SOPN FlexTouch Pen Inject 28 Units into the skin daily with breakfast.    Taking  . ipratropium (ATROVENT) 0.03 % nasal spray PLACE 2 SPRAYS INTO BOTH  NOSTRILS EVERY 12 (TWELVE) HOURS. 16 mL 2   . linaclotide (LINZESS) 145 MCG CAPS capsule Take 1 capsule (145 mcg total) by mouth daily before breakfast. 30 capsule 5 Taking   Scheduled: . aspirin  300 mg Rectal Daily   Or  . aspirin  325 mg Oral Daily  . budesonide  0.5 mg Nebulization BID  . furosemide  80 mg Intravenous BID  . heparin  5,000 Units Subcutaneous Q8H  . insulin aspart  0-9 Units Subcutaneous Q4H  . insulin glargine  25 Units Subcutaneous Daily  . promethazine  25 mg Intravenous Once    ROS: No neck pain, limb pain, chest pain, limb weakness, incoordination, facial droop, dysphagia, dysarthria, confusion or aphasia. Other ROS as per HPI.   Physical Examination: Blood pressure (!) 160/81, pulse 80, temperature 97.9 F (36.6 C), temperature source Oral, resp. rate 16, height '5\' 9"'  (1.753 m), weight 107.6 kg (237 lb 3.4 oz), last menstrual period 11/16/2017, SpO2 99 %.  General: Morbid obesity.  HEENT: [place]/AT. No nuchal rigidity. No meningismus with straight leg raise. Lungs: Respirations unlabored Ext: No edema  Neurologic Examination: Mental Status: Alert, fully oriented, thought content appropriate. Speech fluent without evidence of aphasia.  Able to follow all commands without  difficulty. Cranial Nerves: II:  Visual fields grossly normal, PERRL III,IV, VI: EOM are full and conjugate. Visual pursuits to the right have a saccadic quality. No nystagmus. No ptosis.  V,VII: No facial droop. Facial temp sensation equal bilaterally.  VIII: Hearing intact to voice IX,X: No pharyngeal dysarthria, hoarseness or hypophonia.  XI: Head at midline XII: Midline tongue extension  Motor: RUE and LUE: 5/5 proximal and distal without asymmetry RLE and LLE: 5/5 hip flexion, knee extension and knee flexion bilaterally. 4/5 ADF bilaterally. 5/5 APF bilaterally.  Normal tone throughout; slightly decreased muscle bulk distal lower extremities.  Sensory: Temp and FT sensation intact proximally x 4. No extinction. Decreased FT and temp sensation in stocking distribution bilaterally. Deep Tendon Reflexes:  1+ biceps and brachioradialis bilaterally. Trace patellae bilaterally. Absent achilles bilaterally.  Plantars: Mute bilaterally.  Cerebellar: No ataxia with FNF or H-S bilaterally.  Gait: Deferred  Results for orders placed or performed during the hospital encounter of 12/02/17 (from the past 48 hour(s))  Basic metabolic panel     Status: Abnormal   Collection Time: 12/02/17  1:30 PM  Result Value Ref Range   Sodium 136 135 - 145 mmol/L   Potassium 3.5 3.5 - 5.1 mmol/L   Chloride 104 101 - 111 mmol/L   CO2 25 22 - 32 mmol/L   Glucose, Bld 118 (H) 65 - 99 mg/dL   BUN 41 (H) 6 - 20 mg/dL   Creatinine, Ser 4.93 (H) 0.44 - 1.00 mg/dL   Calcium 8.3 (L) 8.9 - 10.3 mg/dL   GFR calc non Af Amer 10 (L) >60 mL/min   GFR calc Af Amer 11 (L) >60 mL/min    Comment: (NOTE) The eGFR has been calculated using the CKD EPI equation. This calculation has not been validated in all clinical situations. eGFR's persistently <60 mL/min signify possible Chronic Kidney Disease.    Anion gap 7 5 - 15  CBC with Differential     Status: Abnormal   Collection Time: 12/02/17  1:30 PM  Result Value Ref  Range   WBC 8.9 4.0 - 10.5 K/uL   RBC 3.50 (L) 3.87 - 5.11 MIL/uL   Hemoglobin 10.3 (L) 12.0 - 15.0 g/dL   HCT 32.9 (L)  36.0 - 46.0 %   MCV 94.0 78.0 - 100.0 fL   MCH 29.4 26.0 - 34.0 pg   MCHC 31.3 30.0 - 36.0 g/dL   RDW 13.5 11.5 - 15.5 %   Platelets 358 150 - 400 K/uL   Neutrophils Relative % 71 %   Neutro Abs 6.3 1.7 - 7.7 K/uL   Lymphocytes Relative 20 %   Lymphs Abs 1.8 0.7 - 4.0 K/uL   Monocytes Relative 8 %   Monocytes Absolute 0.7 0.1 - 1.0 K/uL   Eosinophils Relative 1 %   Eosinophils Absolute 0.1 0.0 - 0.7 K/uL   Basophils Relative 0 %   Basophils Absolute 0.0 0.0 - 0.1 K/uL   Ct Head Wo Contrast  Result Date: 12/02/2017 CLINICAL DATA:  Acute onset headache.  History of migraines. EXAM: CT HEAD WITHOUT CONTRAST TECHNIQUE: Contiguous axial images were obtained from the base of the skull through the vertex without intravenous contrast. COMPARISON:  MRI brain dated April 22, 2015. CT head dated April 21, 2015. FINDINGS: Brain: New age-indeterminate infarct within the right cerebellum. No evidence of hemorrhage, hydrocephalus, extra-axial collection or mass lesion/mass effect. Vascular: Atherosclerotic vascular calcification of the carotid siphons. No hyperdense vessel. Skull: Normal. Negative for fracture or focal lesion. Sinuses/Orbits: Partial opacification of the left mastoid air cells. The orbits are unremarkable. Other: None. IMPRESSION: 1. New age-indeterminate infarct within the right cerebellum, potentially subacute. Consider MRI for further evaluation as clinically indicated. Electronically Signed   By: Titus Dubin M.D.   On: 12/02/2017 14:00   Assessment: 46 y.o. female presenting with a 2 day history of severe waxing and waning left sided headache. Subacute right cerebellar stroke seen on CT.  1. CT head with new subacute infarct within the right cerebellum. On my review of the images, there is questionable hypodensity within the occipital lobes, suggestive of  possible PRES. Of note, she presented to the OSH with severe HTN.  2. No lateralized deficits on Neurological exam. Peripheral neuropathic findings noted.  3. Unilateral throbbing quality of the headache is suggestive of migraine. Also on DDx is aseptic meningitis and cerebral venous sinus thrombosis. CT head showed no subarachnoid blood.  4. CKD stage V.  5. Stroke Risk Factors - HTN, HLD, DM2  Plan: 1. MRI brain, MRA head, MRV head (ordered) 2. D-dimer level (ordered) 3. HgbA1c, fasting lipid panel 4. IV hydration. Will defer to primary team for order.  5. Phenergan 25 mg IV x 1 (ordered) 6. Agree with starting ASA  '@Electronically'  signed: Dr. Kerney Elbe  12/02/2017, 9:11 PM

## 2017-12-02 NOTE — ED Provider Notes (Signed)
[facility]ERG[facility] DEPARTMENT Provider Note   CSN: [id number] Arrival date & time: 12/02/17  1157     History   Chief Complaint Chief Complaint  Patient presents with  . Headache    HPI Heather [name] is a 46 y.o. female.  The history is provided by the patient and medical records. No language interpreter was used.  Headache     Heather [name] is a 46 y.o. female  with a PMH of DM2, CKD, HTN, HLD, fibromyalgia who presents to the Emergency Department complaining of acute onset of left-sided headache which began about 45 minutes prior to ED arrival. Patient states that pain is constant, severe to the entire right side of the head. No alleviating or aggravating factors noted. No medications taken prior to arrival for symptoms.  No numbness, tingling, weakness, visual changes, trouble with speech, neck pain, fever, chills. Patient states that two nights ago, she had a severe headache as well and "felt like I just couldn't move". This lasted less than 30 minutes and self resolved. She was unable to give me further details about the difficulty with moving.    Past Medical History:  Diagnosis Date  . Allergy   . Anemia of chronic disease   . B12 deficiency 05/10/2014  . Blood transfusion without reported diagnosis   . Bronchitis   . Chronic constipation   . Chronic kidney disease   . Constipation   . Diabetic neuropathy, painful ([place]) 07/13/2013   Stop lyrica due to cost & SE of drowsy. Gabapentin helps Stay off hydrocodone: pt states she thinks "she was addicted" to med. "I don't want that anymore."   . DM2 (diabetes mellitus, type 2) ([place])   . Dyspnea    with exertion  . Fibromyalgia   . Gastroparesis due to DM   . GERD (gastroesophageal reflux disease)   . Headache   . Heart murmur, systolic 37/12/6965   Grade 1/6 ejection murmur per [facility] notes.   . Hyperlipemia   . Hypertension   . IBS (irritable bowel syndrome)   . Infertility   . Leiomyoma of  uterus   . Morbid obesity due to excess calories ([place]) 11/16/2016  . Neuropathy   . Obesity   . Pancreatitis   . 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 Right-sided low back pain without sciatica 12/08/2015  . Secondary hyperthyroidism 11/27/2013   Followed by [facility], notes reviewed 01/01/15. PTH had improved with oral vitamin D: 133 to 85. Last 155-probably not taking vit D.    [name] Upper airway cough syndrome 11/15/2016   rec off acei/ on pepcid hs 11/15/2016 >>>     Patient Active Problem List   Diagnosis Date Noted  . Hypertensive emergency without congestive heart failure 12/02/2017  . Chest pain 06/30/2017  . Routine physical examination 06/30/2017  . Morbid obesity due to excess calories ([place]) 11/16/2016  . Upper airway cough syndrome 11/15/2016  . DM (diabetes mellitus), type 2, uncontrolled, with renal complications ([place]) 89/38/1017  . Anemia of chronic disease 07/22/2016  . Dyslipidemia associated with type 2 diabetes mellitus ([place]) 06/25/2016  . Uncontrolled type 2 diabetes mellitus with both eyes affected by proliferative retinopathy and macular edema, with long-term current use of insulin ([place]) 06/22/2016  . Uncontrolled type 2 diabetes mellitus with diabetic polyneuropathy, with long-term current use of insulin ([place]) 03/08/2016  . Uncontrolled type 2 diabetes mellitus with stage 4 chronic kidney disease, with long-term current use of insulin ([place])  03/08/2016  . Right-sided low back pain without sciatica 12/08/2015  . Proliferative diabetic retinopathy ([place]) 09/29/2015  . Pseudophakia of both eyes 05/30/2015  . 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 B12 deficiency 05/10/2014  . Anemia in chronic kidney disease 04/26/2014  . Chronic kidney disease, stage V ([place]) 04/26/2014  . Heart murmur, systolic 50/08/3817  . Secondary hyperthyroidism 11/27/2013  . Posterior subcapsular cataract, bilateral 10/18/2013  . Diabetic neuropathy, painful ([place])  07/13/2013  . Diabetes mellitus, type II, insulin dependent ([place]) 02/19/2011  . Essential hypertension 02/19/2011  . Gastroparesis 02/19/2011    Past Surgical History:  Procedure Laterality Date  . AV FISTULA PLACEMENT Left 05/09/2017   Procedure: INSERTION OF ARTERIOVENOUS (AV) GRAFT ARM (ARTEGRAFT);  Surgeon: Conrad Glenwood, MD;  Location: [facility];  Service: Vascular;  Laterality: Left;  . BASCILIC VEIN TRANSPOSITION Left 08/31/2016   Procedure: BASILIC VEIN TRANSPOSITION FIRST STAGE;  Surgeon: Conrad Englevale, MD;  Location: [facility];  Service: Vascular;  Laterality: Left;  . FOOT SURGERY Right    t"ook bone out- maybe hammer toe"  . LIGATION OF ARTERIOVENOUS  FISTULA Left 05/09/2017   Procedure: LIGATION OF ARTERIOVENOUS  FISTULA;  Surgeon: Conrad West Mifflin, MD;  Location: [facility];  Service: Vascular;  Laterality: Left;  . MYOMECTOMY    . VITRECTOMY Bilateral     OB History    Gravida Para Term Preterm AB Living   2 1     1 1    SAB TAB Ectopic Multiple Live Births   1               Home Medications    Prior to Admission medications   Medication Sig Start Date End Date Taking? Authorizing Provider  lisinopril (PRINIVIL,ZESTRIL) 20 MG tablet Take 20 mg by mouth daily.   Yes [provider]  albuterol (PROVENTIL HFA;VENTOLIN HFA) 108 (90 Base) MCG/ACT inhaler Inhale 2 puffs into the lungs every 6 (six) hours as needed for wheezing or shortness of breath. 03/24/17   Briscoe Deutscher, DO  amLODipine (NORVASC) 10 MG tablet Take 10 mg by mouth daily.    [provider]  calcitRIOL (ROCALTROL) 0.25 MCG capsule Take 0.5 mcg by mouth daily.     [provider]  cetirizine (ZYRTEC) 5 MG tablet 1/2 po daily prn allergic rhinitis 04/12/17   Briscoe Deutscher, DO  Continuous Blood Gluc Sensor ([place]) MISC by Does not apply route. 09/20/17   [provider]  epoetin alfa (EPOGEN,PROCRIT) 29937 UNIT/ML injection Inject 30,000 Units into the vein every 14  (fourteen) days.     [provider]  fluticasone (FLONASE) 50 MCG/ACT nasal spray Place 2 sprays daily into both nostrils. 11/01/17   Briscoe Deutscher, DO  fluticasone (FLOVENT HFA) 220 MCG/ACT inhaler Inhale 2 puffs into the lungs daily. 10/17/17   Briscoe Deutscher, DO  furosemide (LASIX) 80 MG tablet Take 1 tablet (80 mg total) by mouth 2 (two) times daily. Patient taking differently: Take 160 mg by mouth 2 (two) times daily.  04/12/15   [name] C, NP  gabapentin (NEURONTIN) 300 MG capsule Take 1 capsule (300 mg total) 2 (two) times daily as needed by mouth. 11/02/17   Briscoe Deutscher, DO  glucose blood (ONETOUCH VERIO) test strip Test 4 times daily. Patient taking differently: 1 each by Other route 3 (three) times daily.  07/17/15   [name] C, NP  insulin aspart (NOVOLOG) 100 UNIT/ML FlexPen Inject under the skin 15 minutes  before meals: 6 units before Breakfast, 8 units before Lunch, 8 units before Supper plus scale BG-150/25 up to 60 units daily 07/25/17   [provider]  insulin degludec (TRESIBA FLEXTOUCH) 100 UNIT/ML SOPN FlexTouch Pen Inject 28 Units into the skin daily with breakfast.     [provider]  ipratropium (ATROVENT) 0.03 % nasal spray PLACE 2 SPRAYS INTO BOTH NOSTRILS EVERY 12 (TWELVE) HOURS. 11/01/17 11/08/17  Briscoe Deutscher, DO  linaclotide [facility]) 145 MCG CAPS capsule Take 1 capsule (145 mcg total) by mouth daily before breakfast. 06/30/17   Briscoe Deutscher, DO    Family History Family History  Problem Relation Age of Onset  . Colon polyps Mother   . Diabetes Mother   . Heart murmur Father   . Hypertension Father   . Diabetes Sister   . Kidney failure Sister   . Diabetes Brother   . Retinal degeneration Brother   . Heart murmur Son   . Stroke Paternal Grandmother   . Diabetes Sister     Social History Social History   Tobacco Use  . Smoking status: Never Smoker  . Smokeless tobacco: Never Used  Substance Use Topics  . Alcohol  use: No    Alcohol/week: 0.0 oz  . Drug use: No     Allergies   Ibuprofen and Dilaudid [hydromorphone hcl]   Review of Systems Review of Systems  Neurological: Positive for headaches.  All other systems reviewed and are negative.    Physical Exam Updated Vital Signs BP (!) 177/103 (BP Location: Left Arm)   Pulse 80   Temp 98 F (36.7 C) (Oral)   Resp 16   Ht 5\' 9"  (1.753 m)   Wt 108 kg (238 lb)   LMP 11/16/2017   SpO2 96%   BMI 35.15 kg/m   Physical Exam  Constitutional: [name] oriented to person, place, and time. She appears well-developed and well-nourished. No distress.  HENT:  Head: Normocephalic and atraumatic.  No tenderness to temporal artery.  Cardiovascular: Normal rate, regular rhythm and normal heart sounds.  No murmur heard. Pulmonary/Chest: Effort normal and breath sounds normal. No respiratory distress.  Abdominal: Soft. She exhibits no distension. There is no tenderness.  Musculoskeletal: Normal range of motion.  Neurological: [name] alert and oriented to person, place, and time.  Alert, oriented, thought content appropriate, able to give a coherent history. Speech is clear and goal oriented, able to follow commands.  Cranial Nerves:  II:  Peripheral visual fields grossly normal, pupils equal, round, reactive to light III, IV, VI: EOM intact bilaterally, ptosis not present V,VII: smile symmetric, eyes kept closed tightly against resistance, facial light touch sensation equal VIII: hearing grossly normal IX, X: symmetric soft palate movement, uvula elevates symmetrically  XI: bilateral shoulder shrug symmetric and strong XII: midline tongue extension 5/5 muscle strength in upper and lower extremities bilaterally including strong and equal grip strength and dorsiflexion/plantar flexion Sensory to light touch normal in all four extremities.  Normal finger-to-nose and rapid alternating movements;  normal gait and balance. No drift.  Skin: Skin is warm  and dry.  Nursing note and vitals reviewed.    ED Treatments / Results  Labs (all labs ordered are listed, but only abnormal results are displayed) Labs Reviewed  BASIC METABOLIC PANEL - Abnormal; Notable for the following components:      Result Value   Glucose, Bld 118 (*)    BUN 41 (*)    Creatinine, Ser 4.93 (*)  Calcium 8.3 (*)    GFR calc non Af Amer 10 (*)    GFR calc Af Amer 11 (*)    All other components within normal limits  CBC WITH DIFFERENTIAL/PLATELET - Abnormal; Notable for the following components:   RBC 3.50 (*)    Hemoglobin 10.3 (*)    HCT 32.9 (*)    All other components within normal limits    EKG  EKG Interpretation None       Radiology Ct Head Wo Contrast  Result Date: 12/02/2017 CLINICAL DATA:  Acute onset headache.  History of migraines. EXAM: CT HEAD WITHOUT CONTRAST TECHNIQUE: Contiguous axial images were obtained from the base of the skull through the vertex without intravenous contrast. COMPARISON:  MRI brain dated April 22, 2015. CT head dated April 21, 2015. FINDINGS: Brain: New age-indeterminate infarct within the right cerebellum. No evidence of hemorrhage, hydrocephalus, extra-axial collection or mass lesion/mass effect. Vascular: Atherosclerotic vascular calcification of the carotid siphons. No hyperdense vessel. Skull: Normal. Negative for fracture or focal lesion. Sinuses/Orbits: Partial opacification of the left mastoid air cells. The orbits are unremarkable. Other: None. IMPRESSION: 1. New age-indeterminate infarct within the right cerebellum, potentially subacute. Consider MRI for further evaluation as clinically indicated. Electronically Signed   By: Titus Dubin M.D.   On: 12/02/2017 14:00    Procedures Procedures (including critical care time)  Medications Ordered in ED Medications  HYDROmorphone (DILAUDID) injection 0.5 mg (not administered)  diphenhydrAMINE (BENADRYL) injection 12.5 mg (not administered)  sodium chloride  0.9 % bolus 500 mL (0 mLs Intravenous Stopped 12/02/17 1502)  morphine 4 MG/ML injection 4 mg (4 mg Intravenous Given 12/02/17 1328)     Initial Impression / Assessment and Plan / ED Course  I have reviewed the triage vital signs and the nursing notes.  Pertinent labs & imaging results that were available during my care of the patient were reviewed by me and considered in my medical decision making (see chart for details).    Heather [name] is a 46 y.o. female who presents to ED for acute onset of left-sided headache which began about 45 minutes prior to ED arrival. Patient hypertensive with multiple concerning risk factors including HTN, HLD, CKD, DM2. She also has no history of migraine headaches. No focal neuro deficits on exam. CT head obtained showing a new age-indeterminate infarct within the right cerebellum, potentially subacute. Discussed with neurologist, Dr. Lorraine Lax, who recommends hospitalist admission as well as obtaining MRI and vessel imaging. BP discussed and will hold treatment of BP at this time as well. Discussed case with hospitalist who will admit. Will arrange transfer to [facility].   Patient seen by and discussed with Dr. Leonette Monarch who agrees with treatment plan.    Final Clinical Impressions(s) / ED Diagnoses   Final diagnoses:  Acute intractable headache, unspecified headache type  Cerebrovascular accident (CVA), unspecified mechanism [facility])    ED Discharge Orders    None       Jakerra Floyd, Ozella Almond, PA-C 12/02/17 [place], MD 12/03/17 [phone]

## 2017-12-02 NOTE — H&P (Signed)
History and Physical    Heather [name] FWY:[id number] DOB: 06/14/1971 DOA: 12/02/2017  PCP: Heather Deutscher, DO   Patient coming from: Home, by way of [facility]  Chief Complaint: Headache, dysphagia   HPI: Heather [name] is a 46 y.o. 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  Patient reports that she had experienced a similar severe headache 2 days earlier that resolved spontaneously after about a half an hour.  She had a recurrence in the headache today, did not improve as quickly as the prior one did, and so she came into the ED for evaluation.  Also reports some difficulty with chewing a french fry after the headache today. Denies choking.  Denies fevers or chills, denies recent trauma, and denies focal numbness or weakness.  [facility] ED Course: Upon arrival to the ED, patient is found to be chemistry panel is notable for a afebrile, saturating well on room air, initially hypertensive to 213/116, and with vitals otherwise stable.  BUN of 41 and creatinine of 4.93.  CBC features a stable normocytic anemia with hemoglobin of 10.3.  Noncontrast head CT is notable for a new age-indeterminate infarct within the right cerebellum, possibly subacute.  Neurology was consulted by the ED physician, recommended medical admission to [facility] was CTA head and neck MRI brain.  Blood pressure trended down spontaneously, patient remained hemodynamically stable, and will be admitted to the telemetry unit for ongoing evaluation and headache and dysphagia with CT evidence for ischemic CVA, likely subacute.  Review of Systems:  All other systems reviewed and apart from HPI, are negative.  Past Medical History:  Diagnosis Date  . Allergy   . Anemia of chronic disease   . B12 deficiency 05/10/2014    . Blood transfusion without reported diagnosis   . Bronchitis   . Chronic constipation   . Chronic kidney disease   . Constipation   . Diabetic neuropathy, painful ([place]) 07/13/2013   Stop lyrica due to cost & SE of drowsy. Gabapentin helps Stay off hydrocodone: pt states she thinks "she was addicted" to med. "I don't want that anymore."   . DM2 (diabetes mellitus, type 2) ([place])   . Dyspnea    with exertion  . Fibromyalgia   . Gastroparesis due to DM   . GERD (gastroesophageal reflux disease)   . Headache   . Heart murmur, systolic 27/06/4127   Grade 1/6 ejection murmur per [facility] notes.   . Hyperlipemia   . Hypertension   . IBS (irritable bowel syndrome)   . Infertility   . Leiomyoma of uterus   . Morbid obesity due to excess calories ([place]) 11/16/2016  . Neuropathy   . Obesity   . Pancreatitis   . 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 Right-sided low back pain without sciatica 12/08/2015  . Secondary hyperthyroidism 11/27/2013   Followed by [employer], notes reviewed 01/01/15. PTH had improved with oral vitamin D: 133 to 85. Last 155-probably not taking vit D.    [name] Upper airway cough syndrome 11/15/2016   rec off acei/ on pepcid hs 11/15/2016 >>>     Past Surgical History:  Procedure Laterality Date  . AV FISTULA PLACEMENT Left 05/09/2017   Procedure: INSERTION OF ARTERIOVENOUS (AV) GRAFT ARM (ARTEGRAFT);  Surgeon: Conrad Belfield, MD;  Location: [place];  Service: Vascular;  Laterality: Left;  . BASCILIC VEIN TRANSPOSITION Left 08/31/2016   Procedure: BASILIC VEIN TRANSPOSITION FIRST STAGE;  Surgeon: Conrad Sherrill, MD;  Location: [place];  Service: Vascular;  Laterality: Left;  . FOOT SURGERY Right    t"ook bone out- maybe hammer toe"  . LIGATION OF ARTERIOVENOUS  FISTULA Left 05/09/2017   Procedure: LIGATION OF ARTERIOVENOUS  FISTULA;  Surgeon: Conrad Higginsport, MD;  Location: [place];  Service: Vascular;  Laterality: Left;  . MYOMECTOMY    . VITRECTOMY  Bilateral      reports that  has never smoked. she has never used smokeless tobacco. She reports that she does not drink alcohol or use drugs.  Allergies  Allergen Reactions  . Ibuprofen Other (See Comments)    CKD stage 3. Should avoid.  . Dilaudid [Hydromorphone Hcl] Itching and Other (See Comments)    Can take with Benadryl.    Family History  Problem Relation Age of Onset  . Colon polyps Mother   . Diabetes Mother   . Heart murmur Father   . Hypertension Father   . Diabetes Sister   . Kidney failure Sister   . Diabetes Brother   . Retinal degeneration Brother   . Heart murmur Son   . Stroke Paternal Grandmother   . Diabetes Sister      Prior to Admission medications   Medication Sig Start Date End Date Taking? Authorizing Provider  lisinopril (PRINIVIL,ZESTRIL) 20 MG tablet Take 20 mg by mouth daily.   Yes [provider]  albuterol (PROVENTIL HFA;VENTOLIN HFA) 108 (90 Base) MCG/ACT inhaler Inhale 2 puffs into the lungs every 6 (six) hours as needed for wheezing or shortness of breath. 03/24/17   Heather Deutscher, DO  amLODipine (NORVASC) 10 MG tablet Take 10 mg by mouth daily.    [provider]  calcitRIOL (ROCALTROL) 0.25 MCG capsule Take 0.5 mcg by mouth daily.     [provider]  cetirizine (ZYRTEC) 5 MG tablet 1/2 po daily prn allergic rhinitis 04/12/17   Heather Deutscher, DO  Continuous Blood Gluc Sensor ([place]) MISC by Does not apply route. 09/20/17   [provider]  epoetin alfa (EPOGEN,PROCRIT) 40347 UNIT/ML injection Inject 30,000 Units into the vein every 14 (fourteen) days.     [provider]  fluticasone (FLONASE) 50 MCG/ACT nasal spray Place 2 sprays daily into both nostrils. 11/01/17   Heather Deutscher, DO  fluticasone (FLOVENT HFA) 220 MCG/ACT inhaler Inhale 2 puffs into the lungs daily. 10/17/17   Heather Deutscher, DO  furosemide (LASIX) 80 MG tablet Take 1 tablet (80 mg total) by mouth 2 (two)  times daily. Patient taking differently: Take 160 mg by mouth 2 (two) times daily.  04/12/15   [name] C, NP  gabapentin (NEURONTIN) 300 MG capsule Take 1 capsule (300 mg total) 2 (two) times daily as needed by mouth. 11/02/17   Heather Deutscher, DO  glucose blood (ONETOUCH VERIO) test strip Test 4 times daily. Patient taking differently: 1 each by Other route 3 (three) times daily.  07/17/15   Irene Pap, NP  insulin aspart (NOVOLOG) 100 UNIT/ML FlexPen Inject under the skin 15 minutes before meals: 6 units before Breakfast, 8 units before Lunch, 8 units before Supper plus scale BG-150/25 up to 60 units daily 07/25/17   [provider]  insulin degludec (TRESIBA FLEXTOUCH) 100 UNIT/ML SOPN FlexTouch Pen Inject 28 Units into the skin daily with breakfast.  [provider]  ipratropium (ATROVENT) 0.03 % nasal spray PLACE 2 SPRAYS INTO BOTH NOSTRILS EVERY 12 (TWELVE) HOURS. 11/01/17 11/08/17  Heather Deutscher, DO  linaclotide [facility]) 145 MCG CAPS capsule Take 1 capsule (145 mcg total) by mouth daily before breakfast. 06/30/17   Heather Deutscher, DO    Physical Exam: Vitals:   12/02/17 1745 12/02/17 1800 12/02/17 1930 12/02/17 2055  BP: 139/77 129/62 125/73 (!) 160/81  Pulse: 80 81 81 80  Resp: 14 14 13 16   Temp:    97.9 F (36.6 C)  TempSrc:    Oral  SpO2: 96% 96% 98% 99%  Weight:    107.6 kg (237 lb 3.4 oz)  Height:    5\' 9"  (1.753 m)      Constitutional: NAD, calm, comfortable Eyes: PERTLA, lids and conjunctivae normal ENMT: Mucous membranes are moist. Posterior pharynx clear of any exudate or lesions.   Neck: normal, supple, no masses, no thyromegaly Respiratory: clear to auscultation bilaterally, no wheezing, no crackles. Normal respiratory effort.    Cardiovascular: S1 & S2 heard, regular rate and rhythm. Trace pretibial edema bilaterally. No significant JVD. Abdomen: No distension, no tenderness, no masses palpated. Bowel sounds normal.  Musculoskeletal: no  clubbing / cyanosis. No joint deformity upper and lower extremities.  Skin: no significant rashes, lesions, ulcers. Warm, dry, well-perfused. Neurologic: CN 2-12 grossly intact. Sensation intact. Strength 5/5 in all 4 limbs.  Psychiatric: Alert and oriented x 3. Calm, cooperative.     Labs on Admission: I have personally reviewed following labs and imaging studies  CBC: Recent Labs  Lab 11/28/17 0850 12/02/17 1330  WBC  --  8.9  NEUTROABS  --  6.3  HGB 9.6* 10.3*  HCT  --  32.9*  MCV  --  94.0  PLT  --  814   Basic Metabolic Panel: Recent Labs  Lab 12/02/17 1330  NA 136  K 3.5  CL 104  CO2 25  GLUCOSE 118*  BUN 41*  CREATININE 4.93*  CALCIUM 8.3*   GFR: Estimated Creatinine Clearance: 18.4 mL/min (A) (by C-G formula based on SCr of 4.93 mg/dL (H)). Liver Function Tests: No results for input(s): AST, ALT, ALKPHOS, BILITOT, PROT, ALBUMIN in the last 168 hours. No results for input(s): LIPASE, AMYLASE in the last 168 hours. No results for input(s): AMMONIA in the last 168 hours. Coagulation Profile: No results for input(s): INR, PROTIME in the last 168 hours. Cardiac Enzymes: No results for input(s): CKTOTAL, CKMB, CKMBINDEX, TROPONINI in the last 168 hours. BNP (last 3 results) No results for input(s): PROBNP in the last 8760 hours. HbA1C: No results for input(s): HGBA1C in the last 72 hours. CBG: No results for input(s): GLUCAP in the last 168 hours. Lipid Profile: No results for input(s): CHOL, HDL, LDLCALC, TRIG, CHOLHDL, LDLDIRECT in the last 72 hours. Thyroid Function Tests: No results for input(s): TSH, T4TOTAL, FREET4, T3FREE, THYROIDAB in the last 72 hours. Anemia Panel: No results for input(s): VITAMINB12, FOLATE, FERRITIN, TIBC, IRON, RETICCTPCT in the last 72 hours. Urine analysis:    Component Value Date/Time   COLORURINE YELLOW 02/28/2017 1912   APPEARANCEUR CLEAR 02/28/2017 1912   LABSPEC 1.017 02/28/2017 1912   PHURINE 5.0 02/28/2017 1912    GLUCOSEU >=500 (A) 02/28/2017 1912   HGBUR SMALL (A) 02/28/2017 1912   BILIRUBINUR Negative 07/29/2017 0802   BILIRUBINUR neg 08/08/2013   KETONESUR NEGATIVE 02/28/2017 1912   PROTEINUR 2+ 07/29/2017 0802   PROTEINUR >=300 (A) 02/28/2017 1912   UROBILINOGEN 0.2  07/29/2017 0802   UROBILINOGEN 0.2 09/07/2015 1230   NITRITE Negative 07/29/2017 0802   NITRITE NEGATIVE 02/28/2017 1912   LEUKOCYTESUR Negative 07/29/2017 0802   Sepsis Labs: @LABRCNTIP (procalcitonin:4,lacticidven:4) )No results found for this or any previous visit (from the past 240 hour(s)).   Radiological Exams on Admission: Ct Head Wo Contrast  Result Date: 12/02/2017 CLINICAL DATA:  Acute onset headache.  History of migraines. EXAM: CT HEAD WITHOUT CONTRAST TECHNIQUE: Contiguous axial images were obtained from the base of the skull through the vertex without intravenous contrast. COMPARISON:  MRI brain dated April 22, 2015. CT head dated April 21, 2015. FINDINGS: Brain: New age-indeterminate infarct within the right cerebellum. No evidence of hemorrhage, hydrocephalus, extra-axial collection or mass lesion/mass effect. Vascular: Atherosclerotic vascular calcification of the carotid siphons. No hyperdense vessel. Skull: Normal. Negative for fracture or focal lesion. Sinuses/Orbits: Partial opacification of the left mastoid air cells. The orbits are unremarkable. Other: None. IMPRESSION: 1. New age-indeterminate infarct within the right cerebellum, potentially subacute. Consider MRI for further evaluation as clinically indicated. Electronically Signed   By: Titus Dubin M.D.   On: 12/02/2017 14:00    EKG: Independently reviewed. Normal sinus rhythm.   Assessment/Plan  1. Stroke  - Pt [facility] ED with headache and was noted to have difficulty chewing with EMS  - Head CT reveals a new infarct in right cerebellum, possibly subacute  - Neurology is consulting and much appreciated  - CTA recommended initially, but  precluded by CKD V not yet on HD  - Plan to continue cardiac monitoring, frequent neuro checks, PT/OT/SLP evals, MRI brain, MRA head, carotid dopplers, echocardiogram, A1c, fasting lipid panel, start ASA    2. CKD stage V  - Pt has access in LUE and is being followed closely by nephrology, but HD has not been initiated  - Renally-dose medications, avoid nephrotoxins   3. Hypertension with hypertensive urgency  - BP was 213/116 on arrival to outside hospital, trended down spontaneously  - Hold lisinopril and Norvasc while NPO, use labetalol IVP's prn for now    4. Insulin-dependent DM  - A1c was 7.4% in April 2018  - Managed at home with Tresiba 28 units qAM, and Novolog 6-8 units with meals  - [name] currently NPO after failing stroke swallow eval  - Plan to follow CBG's, continue basal insulin at 20 units daily, and start Novolog sliding-scale    5. Anemia  - Hgb is 10.3 on admission, stable relative to priors  - No bleeding evident, likely secondary to CKD     DVT prophylaxis: sq heparin  Code Status: Full  Family Communication: Discussed with patient Disposition Plan: Admit to telemetry Consults called: Neurology Admission status: Inpatient    Heather Bulls, MD [facility] Pager [phone]  If 7PM-7AM, please contact night-coverage [email or web address] Password [facility]  12/02/2017, 9:38 PM

## 2017-12-02 NOTE — ED Triage Notes (Signed)
Pt c/o h/a x 40 mins

## 2017-12-02 NOTE — ED Notes (Signed)
MD notified of no neuro beds at this time and waiting for hospital discharge per bed placement

## 2017-12-03 ENCOUNTER — Other Ambulatory Visit (HOSPITAL_COMMUNITY): Payer: Managed Care, Other (non HMO)

## 2017-12-03 ENCOUNTER — Inpatient Hospital Stay (HOSPITAL_COMMUNITY): Payer: Managed Care, Other (non HMO)

## 2017-12-03 DIAGNOSIS — R51 Headache: Secondary | ICD-10-CM

## 2017-12-03 DIAGNOSIS — I6789 Other cerebrovascular disease: Secondary | ICD-10-CM

## 2017-12-03 DIAGNOSIS — R519 Headache, unspecified: Secondary | ICD-10-CM

## 2017-12-03 DIAGNOSIS — I639 Cerebral infarction, unspecified: Secondary | ICD-10-CM

## 2017-12-03 HISTORY — DX: Cerebral infarction, unspecified: I63.9

## 2017-12-03 LAB — GLUCOSE, CAPILLARY
GLUCOSE-CAPILLARY: 72 mg/dL (ref 65–99)
GLUCOSE-CAPILLARY: 109 mg/dL — AB (ref 65–99)
GLUCOSE-CAPILLARY: 151 mg/dL — AB (ref 65–99)
Glucose-Capillary: 138 mg/dL — ABNORMAL HIGH (ref 65–99)
Glucose-Capillary: 157 mg/dL — ABNORMAL HIGH (ref 65–99)

## 2017-12-03 LAB — HEMOGLOBIN A1C
Hgb A1c MFr Bld: 6.4 % — ABNORMAL HIGH (ref 4.8–5.6)
Mean Plasma Glucose: 136.98 mg/dL

## 2017-12-03 LAB — ECHOCARDIOGRAM COMPLETE
HEIGHTINCHES: 69 in
Weight: 3774.28 oz

## 2017-12-03 LAB — LIPID PANEL
CHOL/HDL RATIO: 5.2 ratio
CHOLESTEROL: 249 mg/dL — AB (ref 0–200)
HDL: 48 mg/dL (ref >40)
LDL CALC: 169 mg/dL — AB (ref 0–99)
Triglycerides: 158 mg/dL — ABNORMAL HIGH (ref <150)
VLDL: 32 mg/dL (ref 0–40)

## 2017-12-03 LAB — D-DIMER, QUANTITATIVE (NOT AT ARMC): D DIMER QUANT: 1.04 ug{FEU}/mL — AB (ref 0.00–0.50)

## 2017-12-03 MED ORDER — INSULIN ASPART 100 UNIT/ML ~~LOC~~ SOLN
0.0000 [IU] | Freq: Three times a day (TID) | SUBCUTANEOUS | Status: DC
  Administered 2017-12-04: 2 [IU] via SUBCUTANEOUS
  Administered 2017-12-04: 3 [IU] via SUBCUTANEOUS
  Administered 2017-12-04: 2 [IU] via SUBCUTANEOUS

## 2017-12-03 MED ORDER — HYDROXYZINE HCL 10 MG PO TABS
10.0000 mg | ORAL_TABLET | Freq: Four times a day (QID) | ORAL | Status: DC | PRN
  Administered 2017-12-03 – 2017-12-04 (×6): 10 mg via ORAL
  Filled 2017-12-03 (×9): qty 1

## 2017-12-03 MED ORDER — PROMETHAZINE HCL 25 MG/ML IJ SOLN
25.0000 mg | Freq: Once | INTRAMUSCULAR | Status: AC
  Administered 2017-12-04: 25 mg via INTRAVENOUS
  Filled 2017-12-03: qty 1

## 2017-12-03 MED ORDER — ATORVASTATIN CALCIUM 80 MG PO TABS
80.0000 mg | ORAL_TABLET | Freq: Every day | ORAL | Status: DC
  Administered 2017-12-03 – 2017-12-05 (×3): 80 mg via ORAL
  Filled 2017-12-03 (×3): qty 1

## 2017-12-03 NOTE — Progress Notes (Signed)
  Echocardiogram 2D Echocardiogram has been performed.  Johny Chess 12/03/2017, 9:04 AM

## 2017-12-03 NOTE — Evaluation (Signed)
Clinical/Bedside Swallow Evaluation Patient Details  Name: Heather [name] MRN: [id number] Date of Birth: 07-18-1971  Today's Date: 12/03/2017 Time: SLP Start Time (ACUTE ONLY): 85 SLP Stop Time (ACUTE ONLY): 1020 SLP Time Calculation (min) (ACUTE ONLY): 15 min  Past Medical History:  Past Medical History:  Diagnosis Date  . Allergy   . Anemia of chronic disease   . B12 deficiency 05/10/2014  . Blood transfusion without reported diagnosis   . Bronchitis   . Chronic constipation   . Chronic kidney disease   . Constipation   . Diabetic neuropathy, painful ([place]) 07/13/2013   Stop lyrica due to cost & SE of drowsy. Gabapentin helps Stay off hydrocodone: pt states she thinks "she was addicted" to med. "I don't want that anymore."   . DM2 (diabetes mellitus, type 2) ([place])   . Dyspnea    with exertion  . Fibromyalgia   . Gastroparesis due to DM   . GERD (gastroesophageal reflux disease)   . Headache   . Heart murmur, systolic 99/01/4267   Grade 1/6 ejection murmur per [facility] notes.   . Hyperlipemia   . Hypertension   . IBS (irritable bowel syndrome)   . Infertility   . Leiomyoma of uterus   . Morbid obesity due to excess calories ([place]) 11/16/2016  . Neuropathy   . Obesity   . Pancreatitis   . 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 Right-sided low back pain without sciatica 12/08/2015  . Secondary hyperthyroidism 11/27/2013   Followed by [employer], notes reviewed 01/01/15. PTH had improved with oral vitamin D: 133 to 85. Last 155-probably not taking vit D.    [name] Upper airway cough syndrome 11/15/2016   rec off acei/ on pepcid hs 11/15/2016 >>>    Past Surgical History:  Past Surgical History:  Procedure Laterality Date  . AV FISTULA PLACEMENT Left 05/09/2017   Procedure: INSERTION OF ARTERIOVENOUS (AV) GRAFT ARM (ARTEGRAFT);  Surgeon: Conrad Trevose, MD;  Location: [place];  Service: Vascular;  Laterality: Left;  . BASCILIC VEIN TRANSPOSITION Left  08/31/2016   Procedure: BASILIC VEIN TRANSPOSITION FIRST STAGE;  Surgeon: Conrad Collyer, MD;  Location: [place];  Service: Vascular;  Laterality: Left;  . FOOT SURGERY Right    t"ook bone out- maybe hammer toe"  . LIGATION OF ARTERIOVENOUS  FISTULA Left 05/09/2017   Procedure: LIGATION OF ARTERIOVENOUS  FISTULA;  Surgeon: Conrad Edmond, MD;  Location: [place];  Service: Vascular;  Laterality: Left;  . MYOMECTOMY    . VITRECTOMY Bilateral    HPI:  Patient is a 46 y.o. 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 At the time of headache, patient also c/o difficulty chewing a french fry. CT on 12/7 revealed "age-indeterminate infarct within the right cerebellum,   Assessment / Plan / Recommendation Clinical Impression  Patient presents with oropharyngeal swallow function that is within normal limits. She did not exhibit any overt s/s of aspiration or penetration, swallow initiation and laryngeal elevation were within normal limits per palpation and oral-motor function was within normal limits.  SLP Visit Diagnosis: Dysphagia, oral phase (R13.11)    Aspiration Risk  No limitations    Diet Recommendation Regular;Thin liquid   Liquid Administration via: Straw;Cup Supervision: Patient able to self feed    Other  Recommendations     Follow up Recommendations None  Frequency and Duration   N/A         Prognosis   N/A, safe for regular consistencies liquids and solids     Swallow Study   General Date of Onset: 12/02/17 HPI: Patient is a 46 y.o. 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 At the time of headache, patient also c/o difficulty chewing a french fry. CT on 12/7 revealed "age-indeterminate infarct within the right cerebellum, Type of Study: Bedside Swallow  Evaluation Previous Swallow Assessment: N/A Diet Prior to this Study: NPO Temperature Spikes Noted: No Respiratory Status: Room air History of Recent Intubation: No Behavior/Cognition: Alert;Cooperative;Pleasant mood Oral Cavity Assessment: Within Functional Limits Oral Care Completed by SLP: No Oral Cavity - Dentition: Adequate natural dentition Vision: Functional for self-feeding Self-Feeding Abilities: Able to feed self Patient Positioning: Upright in bed Baseline Vocal Quality: Normal Volitional Cough: Strong Volitional Swallow: Able to elicit    Oral/Motor/Sensory Function Overall Oral Motor/Sensory Function: Within functional limits   Ice Chips Ice chips: Not tested   Thin Liquid Thin Liquid: Within functional limits Presentation: Cup;Self Fed;Straw Other Comments: No overt s/s of aspiration or penetration observed during this evaluation    Nectar Thick Nectar Thick Liquid: Not tested   Honey Thick Honey Thick Liquid: Not tested   Puree Puree: Not tested   Solid   GO   Solid: Not tested Other Comments: Patient reported to clinician that her husband got her some french fries at [facility] but when she was chewing them they felt "thick" so she didn't want to eat them.        Sonia Baller, MA, CCC-SLP 12/03/17 12:01 PM

## 2017-12-03 NOTE — Progress Notes (Signed)
PROGRESS NOTE    Heather [name]  FGH:[id number] DOB: 29-Aug-1971 DOA: 12/02/2017 PCP: Briscoe Deutscher, DO  Brief Narrative:46 y.o. 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  Patient reports that she had experienced a similar severe headache 2 days earlier that resolved spontaneously after about a half an hour.  She had a recurrence in the headache today, did not improve as quickly as the prior one did, and so she came into the ED for evaluation.  Also reports some difficulty with chewing a french fry after the headache today. Denies choking.  Denies fevers or chills, denies recent trauma, and denies focal numbness or weakness.  [facility] ED Course: Upon arrival to the ED, patient is found to be chemistry panel is notable for a afebrile, saturating well on room air, initially hypertensive to 213/116, and with vitals otherwise stable.  BUN of 41 and creatinine of 4.93.  CBC features a stable normocytic anemia with hemoglobin of 10.3.  Noncontrast head CT is notable for a new age-indeterminate infarct within the right cerebellum, possibly subacute.  Neurology was consulted by the ED physician, recommended medical admission to [facility] was CTA head and neck MRI brain.  Blood pressure trended down spontaneously, patient remained hemodynamically stable, and will be admitted to the telemetry unit for ongoing evaluation and headache and dysphagia with CT evidence for ischemic CVA, likely subacute.   Assessment & Plan:   Principal Problem:   Stroke (cerebrum) ([place]) Active Problems:   Diabetes mellitus, type II, insulin dependent (HCC)   Essential hypertension   Anemia in chronic kidney disease   Chronic kidney disease, stage V (HCC)   Anemia of chronic disease   Hypertensive  urgency  1. Stroke  - Pt presented to [facility] ED with headache and was noted to have difficulty chewing with EMS  - Head CT reveals a new infarct in right cerebellum, possibly subacute  - Neurology is consulting and much appreciated  - CTA recommended initially, but precluded by CKD V not yet on HD  - Plan to continue cardiac monitoring, frequent neuro checks, PT/OT/SLP evals, MRI brain, MRA head, carotid dopplers, echocardiogram, A1c, fasting lipid panel, start ASA  echocardiogram and cus done .pending results.  2. CKD stage V  - Pt has access in LUE and is being followed closely by nephrology, but HD has not been initiated  - Renally-dose medications, avoid nephrotoxins   3. Hypertension with hypertensive urgency  - BP was 213/116 on arrival to outside hospital, trended down spontaneously  - Hold lisinopril and Norvasc while NPO, use labetalol IVP's prn for now    4. Insulin-dependent DM  - A1c was 7.4% in April 2018  - Managed at home with Tresiba 28 units qAM, and Novolog 6-8 units with meals  - [name] currently NPO after failing stroke swallow eval  - Plan to follow CBG's, continue basal insulin at 20 units daily, and start Novolog sliding-scale   5.hyperlipdemia-start statin therapy    DVT prophylaxis:heparin Code Status full Family Communication: none Disposition Plan:tbd Consultants: neuro Procedures: none Antimicrobials: none Subjective:still has a head ache,no voimitting..  Objective:resting in bed in nad.. Vitals:   12/03/17 0500 12/03/17 0648 12/03/17 0931 12/03/17 1008  BP:  (!) 151/77  (!) 192/89  Pulse:  76  80  Resp:  18  18  Temp:  97.6 F (36.4 C)  (!) 97.5 F (36.4 C)  TempSrc:  Oral  Oral  SpO2:  100% 99% 99%  Weight: 107 kg (235 lb 14.3 oz)     Height:        Intake/Output Summary (Last 24 hours) at 12/03/2017 1200 Last data filed at 12/02/2017 1502 Gross per 24 hour  Intake 500 ml  Output -  Net 500 ml   Filed Weights   12/02/17 1206  12/02/17 2055 12/03/17 0500  Weight: 108 kg (238 lb) 107.6 kg (237 lb 3.4 oz) 107 kg (235 lb 14.3 oz)    Examination:  General exam: Appears calm and comfortable  Respiratory system: Clear to auscultation. Respiratory effort normal. Cardiovascular system: S1 & S2 heard, RRR. No JVD, murmurs, rubs, gallops or clicks. No pedal edema. Gastrointestinal system: Abdomen is nondistended, soft and nontender. No organomegaly or masses felt. Normal bowel sounds heard. Central nervous system: Alert and oriented. No focal neurological deficits. Extremities: Symmetric 5 x 5 power. Skin: No rashes, lesions or ulcers Psychiatry: Judgement and insight appear normal. Mood & affect appropriate.     Data Reviewed: I have personally reviewed following labs and imaging studies  CBC: Recent Labs  Lab 11/28/17 0850 12/02/17 1330  WBC  --  8.9  NEUTROABS  --  6.3  HGB 9.6* 10.3*  HCT  --  32.9*  MCV  --  94.0  PLT  --  585   Basic Metabolic Panel: Recent Labs  Lab 12/02/17 1330  NA 136  K 3.5  CL 104  CO2 25  GLUCOSE 118*  BUN 41*  CREATININE 4.93*  CALCIUM 8.3*   GFR: Estimated Creatinine Clearance: 18.4 mL/min (A) (by C-G formula based on SCr of 4.93 mg/dL (H)). Liver Function Tests: No results for input(s): AST, ALT, ALKPHOS, BILITOT, PROT, ALBUMIN in the last 168 hours. No results for input(s): LIPASE, AMYLASE in the last 168 hours. No results for input(s): AMMONIA in the last 168 hours. Coagulation Profile: No results for input(s): INR, PROTIME in the last 168 hours. Cardiac Enzymes: No results for input(s): CKTOTAL, CKMB, CKMBINDEX, TROPONINI in the last 168 hours. BNP (last 3 results) No results for input(s): PROBNP in the last 8760 hours. HbA1C: Recent Labs    12/03/17 0409  HGBA1C 6.4*   CBG: Recent Labs  Lab 12/03/17 0015 12/03/17 0415 12/03/17 1141  GLUCAP 157* 72 109*   Lipid Profile: Recent Labs    12/03/17 0409  CHOL 249*  HDL 48  LDLCALC 169*  TRIG  158*  CHOLHDL 5.2   Thyroid Function Tests: No results for input(s): TSH, T4TOTAL, FREET4, T3FREE, THYROIDAB in the last 72 hours. Anemia Panel: No results for input(s): VITAMINB12, FOLATE, FERRITIN, TIBC, IRON, RETICCTPCT in the last 72 hours. Sepsis Labs: No results for input(s): PROCALCITON, LATICACIDVEN in the last 168 hours.  No results found for this or any previous visit (from the past 240 hour(s)).       Radiology Studies: Ct Head Wo Contrast  Result Date: 12/02/2017 CLINICAL DATA:  Acute onset headache.  History of migraines. EXAM: CT HEAD WITHOUT CONTRAST TECHNIQUE: Contiguous axial images were obtained from the base of the skull through the vertex without intravenous contrast. COMPARISON:  MRI brain dated April 22, 2015. CT head dated April 21, 2015. FINDINGS: Brain: New age-indeterminate infarct within the right cerebellum. No evidence of hemorrhage, hydrocephalus, extra-axial collection or mass lesion/mass effect. Vascular: Atherosclerotic vascular calcification of the carotid siphons. No hyperdense vessel. Skull: Normal. Negative for  fracture or focal lesion. Sinuses/Orbits: Partial opacification of the left mastoid air cells. The orbits are unremarkable. Other: None. IMPRESSION: 1. New age-indeterminate infarct within the right cerebellum, potentially subacute. Consider MRI for further evaluation as clinically indicated. Electronically Signed   By: Titus Dubin M.D.   On: 12/02/2017 14:00        Scheduled Meds: . aspirin  300 mg Rectal Daily   Or  . aspirin  325 mg Oral Daily  . budesonide  0.5 mg Nebulization BID  . furosemide  80 mg Intravenous BID  . heparin  5,000 Units Subcutaneous Q8H  . insulin aspart  0-9 Units Subcutaneous Q4H  . insulin glargine  25 Units Subcutaneous Daily  . promethazine  25 mg Intravenous Once   Continuous Infusions:   LOS: 1 day      Georgette Shell, MD [facility]   If 7PM-7AM, please contact  night-coverage [email or web address] Password [email or web address] 12/03/2017, 12:00 PM

## 2017-12-03 NOTE — Evaluation (Signed)
Physical Therapy Evaluation Patient Details Name: Heather [name] MRN: [id number] DOB: 30-Aug-1971 Today's Date: 12/03/2017   History of Present Illness  46 y.o. 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 At the time of headache, patient also c/o difficulty chewing a french fry. CT on 12/7 revealed "age-indeterminate infarct within the right cerebellum,   Clinical Impression  Patient presents with problems listed below.  Will benefit from acute PT to maximize functional mobility prior to discharge. Patient is somewhat unsteady during gait. Recommend she use an assistive device during gait for safety. Recommend HHPT at d/c for continued therapy for balance and gait training.    Follow Up Recommendations Home health PT;Supervision/Assistance - 24 hour    Equipment Recommendations  Rolling walker with 5" wheels;3in1 (PT)    Recommendations for Other Services       Precautions / Restrictions Precautions Precautions: Fall Precaution Comments: very unsteady, h/o falls Restrictions Weight Bearing Restrictions: No      Mobility  Bed Mobility Overal bed mobility: Modified Independent             General bed mobility comments: Using bed rail.  HOB up.  Transfers Overall transfer level: Needs assistance Equipment used: None   Sit to Stand: Min assist         General transfer comment: Instructed patient and spouse on safe way to assist patient move to standing.  Husband had been pulling on patient's arms.  Had husband put hand on patient's back to scoot to EOB and shift trunk forward to stand.  Much improved technique by end of session.  Ambulation/Gait Ambulation/Gait assistance: Min guard Ambulation Distance (Feet): 24 Feet Assistive device: None Gait Pattern/deviations: Step-through pattern;Decreased step length - right;Decreased step length - left;Decreased  stride length;Steppage;Drifts right/left;Decreased dorsiflexion - right(Steppage RLE with decreased DF) Gait velocity: decreased Gait velocity interpretation: Below normal speed for age/gender General Gait Details: Patient with slow guarded gait.  Reaching for furniture/walls during gait with no assistive device.  Steppage RLE - may need AFO.  Patient reports being fearful of LE's buckling as they did in the past leading to falls.  Stairs            Wheelchair Mobility    Modified Rankin (Stroke Patients Only) Modified Rankin (Stroke Patients Only) Pre-Morbid Rankin Score: Moderate disability Modified Rankin: Moderately severe disability     Balance Overall balance assessment: Needs assistance;History of Falls   Sitting balance-Leahy Scale: Good     Standing balance support: Single extremity supported Standing balance-Leahy Scale: Poor                               Pertinent Vitals/Pain Pain Assessment: 0-10 Pain Score: 3  Pain Location: head Pain Descriptors / Indicators: Headache Pain Intervention(s): Monitored during session;RN gave pain meds during session    Home Living Family/patient expects to be discharged to:: Private residence Living Arrangements: Spouse/significant other Available Help at Discharge: Family Type of Home: House Home Access: Stairs to enter Entrance Stairs-Rails: None Technical brewer of Steps: 1 Home Layout: One level Home Equipment: Environmental consultant - 2 wheels      Prior Function Level of Independence: Needs assistance   Gait / Transfers Assistance Needed: walks in the home with spouse, does not walk community distances at baseline  ADL's / Homemaking Assistance Needed: Patient reports she stands to  shower.  Cannot get into tub.        Hand Dominance   Dominant Hand: Right    Extremity/Trunk Assessment   Upper Extremity Assessment Upper Extremity Assessment: Defer to OT evaluation    Lower Extremity  Assessment Lower Extremity Assessment: Generalized weakness;RLE deficits/detail;LLE deficits/detail RLE Deficits / Details: Slightly weaker and decreased coordination when compared to LLE.  Decreased DF at 2+/5 - may need AFO RLE Sensation: history of peripheral neuropathy(Numb mid-shin and distal) RLE Coordination: decreased gross motor LLE Deficits / Details: Decreased sensation ankle and foot.  Noted Lt knee "giving way" during MMT. LLE Sensation: history of peripheral neuropathy(Numb ankle and foot) LLE Coordination: decreased gross motor    Cervical / Trunk Assessment Cervical / Trunk Assessment: Normal  Communication   Communication: No difficulties  Cognition Arousal/Alertness: Awake/alert Behavior During Therapy: WFL for tasks assessed/performed Overall Cognitive Status: Within Functional Limits for tasks assessed                                        General Comments      Exercises Other Exercises Other Exercises: Partial sit > stand; 5 reps   Assessment/Plan    PT Assessment Patient needs continued PT services  PT Problem List Decreased strength;Decreased activity tolerance;Decreased balance;Decreased mobility;Decreased coordination;Decreased knowledge of use of DME;Impaired sensation;Obesity;Pain       PT Treatment Interventions DME instruction;Gait training;Functional mobility training;Therapeutic activities;Therapeutic exercise;Balance training;Neuromuscular re-education;Patient/family education    PT Goals (Current goals can be found in the Care Plan section)  Acute Rehab PT Goals Patient Stated Goal: To go home PT Goal Formulation: With patient/family Time For Goal Achievement: 12/17/17 Potential to Achieve Goals: Good    Frequency Min 4X/week   Barriers to discharge        Co-evaluation               AM-PAC PT "6 Clicks" Daily Activity  Outcome Measure Difficulty turning over in bed (including adjusting bedclothes, sheets and  blankets)?: None Difficulty moving from lying on back to sitting on the side of the bed? : None Difficulty sitting down on and standing up from a chair with arms (e.g., wheelchair, bedside commode, etc,.)?: Unable Help needed moving to and from a bed to chair (including a wheelchair)?: A Little Help needed walking in hospital room?: A Little Help needed climbing 3-5 steps with a railing? : A Lot 6 Click Score: 17    End of Session Equipment Utilized During Treatment: Gait belt Activity Tolerance: Patient tolerated treatment well Patient left: in bed;with call bell/phone within reach;with family/visitor present Nurse Communication: Patient requests pain meds PT Visit Diagnosis: Unsteadiness on feet (R26.81);Other abnormalities of gait and mobility (R26.89);Repeated falls (R29.6);Muscle weakness (generalized) (M62.81);Pain Pain - part of body: (Headache)    Time: 1826-1900 PT Time Calculation (min) (ACUTE ONLY): 34 min   Charges:   PT Evaluation $PT Eval Moderate Complexity: 1 Mod PT Treatments $Gait Training: 8-22 mins $Therapeutic Activity: 8-22 mins   PT G Codes:        Carita Pian. Sanjuana Kava, [facility] Acute Rehab Services Pager Winter 12/03/2017, 9:15 PM

## 2017-12-03 NOTE — Progress Notes (Signed)
SLP Cancellation Note  Patient Details Name: Heather [name] MRN: [id number] DOB: February 05, 1971   Cancelled treatment:       Reason Eval/Treat Not Completed: SLP screened, no needs identified, will sign off. BSE completed, but SLE not completed as patient was screened by SLP and did not present with any speech, language or cognitive deficits.   Sonia Baller, MA, CCC-SLP 12/03/17 12:02 PM

## 2017-12-03 NOTE — Progress Notes (Signed)
STROKE TEAM PROGRESS NOTE   HISTORY OF PRESENT ILLNESS (per record) Heather [name] is an 46 y.o. female with stage V ESRD and a remote history of migraines who presents with severe unilateral throbbing headache. She states that the headache does not feel like her usual migraine. Headache began suddenly on Wednesday evening with 10/10 throbbing pain to the left side of her head. The pain was so severe that she could not get out of bed to call her husband. She denies having had any associated weakness, confusion or incoordination. Also without neck pain. She states that the headache did not feel like one of the migraines she had had in her remote past, despite its throbbing quality and unilaterality. She had no associated N/V, no photophobia, sonophobia or osmophobia and no neck stiffness. Also without fever or chills. She slept through the pain and by Thursday morning, the pain had improved to about 5/10 and she was able to go to work. Thursday evening, she went to bed, still with the headache.   On Friday morning, she awoke without a headache. However, later on she had sudden recurrence of the headache, which was again severe, so she went to the ED at [facility] for further evaluation. Upon presentation blood pressure was noted to be 200/114 and has since then came down to 177/103. Evaluation revealed no neurological deficits. 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 She was then transported to [facility] for further assessment, including MRI brain and vessel imaging. Currently, her headache is significantly better, rated at about 2/10.  PMHx includes new graft in place pending dialysis, secondary hyperparathyroidism, DM with peripheral neuropathy, morbid obesity, HTN and HLD.     SUBJECTIVE (INTERVAL HISTORY) Her family is at bedside, she still has a headache.   OBJECTIVE Temp:  [97.5 F (36.4 C)-97.9 F (36.6 C)] 97.9 F (36.6 C)  (12/08 1339) Pulse Rate:  [75-89] 89 (12/08 1339) Cardiac Rhythm: Normal sinus rhythm (12/08 0700) Resp:  [13-18] 18 (12/08 1339) BP: (121-192)/(62-114) 155/70 (12/08 1339) SpO2:  [94 %-100 %] 99 % (12/08 1339) Weight:  [235 lb 14.3 oz (107 kg)-237 lb 3.4 oz (107.6 kg)] 235 lb 14.3 oz (107 kg) (12/08 0500)  CBC:  Recent Labs  Lab 11/28/17 0850 12/02/17 1330  WBC  --  8.9  NEUTROABS  --  6.3  HGB 9.6* 10.3*  HCT  --  32.9*  MCV  --  94.0  PLT  --  258    Basic Metabolic Panel:  Recent Labs  Lab 12/02/17 1330  NA 136  K 3.5  CL 104  CO2 25  GLUCOSE 118*  BUN 41*  CREATININE 4.93*  CALCIUM 8.3*    Lipid Panel:     Component Value Date/Time   CHOL 249 (H) 12/03/2017 0409   CHOL 277 02/13/2013   TRIG 158 (H) 12/03/2017 0409   TRIG 200 02/13/2013   HDL 48 12/03/2017 0409   CHOLHDL 5.2 12/03/2017 0409   VLDL 32 12/03/2017 0409   LDLCALC 169 (H) 12/03/2017 0409   LDLCALC 195 02/13/2013   HgbA1c:  Lab Results  Component Value Date   HGBA1C 6.4 (H) 12/03/2017   Urine Drug Screen: No results found for: LABOPIA, COCAINSCRNUR, LABBENZ, AMPHETMU, THCU, LABBARB  Alcohol Level No results found for: ETH  IMAGING   Ct Head Wo Contrast 12/02/2017 IMPRESSION:  1. New age-indeterminate infarct within the right cerebellum, potentially subacute.  Consider MRI for  further evaluation as clinically indicated.    MR MRV MRA Head - pending   Transthoracic Echocardiogram  12/03/2017 Study Conclusions  - Left ventricle: The cavity size was normal. Wall thickness was   increased in a pattern of moderate LVH. Systolic function was   normal. The estimated ejection fraction was in the range of 60%   to 65%. Diastolic function is abnormal, indeterminant grade.   There is evidence of elevated LA pressure. Wall motion was   normal; there were no regional wall motion abnormalities. - Aortic valve: Valve area (VTI): 2.15 cm^2. Valve area (Vmax):   2.15 cm^2. Valve area  (Vmean): 1.97 cm^2.    Bilateral Carotid Dopplers 12/03/2017 1-39% ICA plaquing. Vertebral artery flow is antegrade.      PHYSICAL EXAM Vitals:   12/03/17 0648 12/03/17 0931 12/03/17 1008 12/03/17 1339  BP: (!) 151/77  (!) 192/89 (!) 155/70  Pulse: 76  80 89  Resp: 18  18 18   Temp: 97.6 F (36.4 C)  (!) 97.5 F (36.4 C) 97.9 F (36.6 C)  TempSrc: Oral  Oral Oral  SpO2: 100% 99% 99% 99%  Weight:      Height:           ASSESSMENT/PLAN [name] is a 46 y.o. female with history of end-stage renal disease, hyperparathyroidism, morbid obesity, neuropathy, hypertension, hyperlipidemia, migraine headaches, fibromyalgia, diabetes mellitus, and anemia of chronic disease presenting with a severe headache. She did not receive IV t-PA due to no focal neurologic deficits and late presentation.  Stroke:  infarct within the right cerebellum - secondary to small vessel disease. Pending MRI for further information.  Resultant  No deficit  CT head - New age-indeterminate infarct within the right cerebellum, potentially subacute.   MRI head - pending  MRV - pending  MRA head - pending  Carotid Doppler - 1-39% ICA plaquing. Vertebral artery flow is antegrade.   2D Echo - EF 60-65%. No cardiac source of emboli identified.  LDL - 169  HgbA1c - 6.4  VTE prophylaxis - subcutaneous heparin Diet renal/carb modified with fluid restriction Diet-HS Snack? Nothing; Room service appropriate? Yes; Fluid consistency: Thin  No antithrombotic prior to admission, now on aspirin 325 mg daily  Patient counseled to be compliant with her antithrombotic medications  Ongoing aggressive stroke risk factor management  Therapy recommendations:  pending  Disposition:  Pending  Hypertension  Occasional high blood pressures but Stable  Permissive hypertension (OK if < 220/120) but gradually normalize in 5-7 days  Long-term BP goal normotensive  Hyperlipidemia  Home meds:  No lipid  lowering medications prior to admission  LDL 169, goal < 70  Now on Lipitor 80 mg daily  Continue statin at discharge  Diabetes  HgbA1c 6.4, goal < 7.0  Controlled  Other Stroke Risk Factors  Obesity, Body mass index is 34.84 kg/m., recommend weight loss, diet and exercise as appropriate   Family hx stroke (grandmother)   Other Active Problems  ESRD  Anemia of chronic disease  History of migraine headaches   Plan / Recommendations  Await MRI / MRA / MRV  Continue Lipitor  Continue aspirin  Consider urine drug screen  Hospital day # 1  Personally  participated in, made any corrections needed, and agree with history, physical, neuro exam,assessment and plan as stated above.     Sarina Ill, MD     To contact Stroke Continuity provider, please refer to [email or web address] After hours, contact General Neurology

## 2017-12-03 NOTE — Evaluation (Signed)
Occupational Therapy Evaluation Patient Details Name: Heather [name] MRN: [id number] DOB: 04-Aug-1971 Today's Date: 12/03/2017    History of Present Illness 46 y.o. 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 At the time of headache, patient also c/o difficulty chewing a french fry. CT on 12/7 revealed "age-indeterminate infarct within the right cerebellum,    Clinical Impression   Pt admitted with the above diagnoses and presents with below problem list. Pt will benefit from continued acute OT to address the below listed deficits and maximize independence with basic ADLs prior to d/c to venue below. PTA pt reports she would walk within the home with her spouse beside her, not a community ambulator ("I use the electric carts at the store"). Pt presents with balance deficits that she reports are worse than her usual. Pt is min to mod A with LB ADLs and household functional mobility.      Follow Up Recommendations  Home health OT;Supervision/Assistance - 24 hour    Equipment Recommendations  3 in 1 bedside commode    Recommendations for Other Services PT consult     Precautions / Restrictions Precautions Precautions: Fall Precaution Comments: very unsteady, h/o falls Restrictions Weight Bearing Restrictions: No      Mobility Bed Mobility Overal bed mobility: Modified Independent             General bed mobility comments: + bed rail, HOB elevated  Transfers Overall transfer level: Needs assistance Equipment used: 1 person hand held assist Transfers: Sit to/from Stand Sit to Stand: Min assist         General transfer comment: min A to steady, from EOB    Balance Overall balance assessment: Needs assistance;History of Falls Sitting-balance support: No upper extremity supported;Feet supported Sitting balance-Leahy Scale: Good     Standing balance support:  Single extremity supported;Bilateral upper extremity supported;No upper extremity supported Standing balance-Leahy Scale: Poor Standing balance comment: constant external support needed for dynamic tasks especially. very unsteady                           ADL either performed or assessed with clinical judgement   ADL Overall ADL's : Needs assistance/impaired Eating/Feeding: Set up;Sitting   Grooming: Set up;Sitting   Upper Body Bathing: Set up;Sitting   Lower Body Bathing: Moderate assistance;Sit to/from stand;Minimal assistance   Upper Body Dressing : Set up;Sitting   Lower Body Dressing: Minimal assistance;Moderate assistance;Sit to/from stand   Toilet Transfer: Minimal assistance;Ambulation   Toileting- Clothing Manipulation and Hygiene: Minimal assistance;Sit to/from stand   Tub/ Shower Transfer: Tub transfer;Moderate assistance;Ambulation;3 in 1   Functional mobility during ADLs: Minimal assistance;Moderate assistance General ADL Comments: Pt mostly min A with dynamic standing ADL tasks and functional mobility, occasional mod A for LOB/swaying. Discussed fall prevention strategies.      Vision         Perception     Praxis      Pertinent Vitals/Pain Pain Assessment: Faces Pain Score: 2  Faces Pain Scale: Hurts a little bit Pain Location: head Pain Descriptors / Indicators: Aching Pain Intervention(s): Monitored during session     Hand Dominance     Extremity/Trunk Assessment Upper Extremity Assessment Upper Extremity Assessment: RUE deficits/detail;LUE deficits/detail;Generalized weakness RUE Deficits / Details: pt reports baseline difficulty with fine motor tasks LUE Deficits / Details: pt reports baseline difficulty with fine motor  tasks   Lower Extremity Assessment Lower Extremity Assessment: Defer to PT evaluation       Communication Communication Communication: No difficulties   Cognition Arousal/Alertness: Awake/alert Behavior  During Therapy: WFL for tasks assessed/performed Overall Cognitive Status: Within Functional Limits for tasks assessed                                     General Comments       Exercises     Shoulder Instructions      Home Living Family/patient expects to be discharged to:: Private residence Living Arrangements: Spouse/significant other Available Help at Discharge: Family Type of Home: House Home Access: Stairs to enter Technical brewer of Steps: 1   Home Layout: One level     Bathroom Shower/Tub: Teacher, early years/pre: Standard     Home Equipment: (walker)      Lives With: Spouse    Prior Functioning/Environment Level of Independence: Needs assistance  Gait / Transfers Assistance Needed: walks in the home with spouse, does not walk community distances at baseline              OT Problem List: Impaired balance (sitting and/or standing);Decreased knowledge of use of DME or AE;Decreased knowledge of precautions;Pain      OT Treatment/Interventions: Self-care/ADL training;Neuromuscular education;DME and/or AE instruction;Therapeutic activities;Patient/family education;Balance training    OT Goals(Current goals can be found in the care plan section) Acute Rehab OT Goals Patient Stated Goal: home  OT Goal Formulation: With patient/family Time For Goal Achievement: 12/17/17 Potential to Achieve Goals: Good ADL Goals Pt Will Perform Lower Body Bathing: with modified independence;sit to/from stand Pt Will Perform Lower Body Dressing: with modified independence;sit to/from stand Pt Will Transfer to Toilet: with modified independence;ambulating Pt Will Perform Toileting - Clothing Manipulation and hygiene: with modified independence;sit to/from stand Pt Will Perform Tub/Shower Transfer: with modified independence;ambulating;3 in 1;rolling walker  OT Frequency: Min 2X/week   Barriers to D/C:            Co-evaluation               AM-PAC PT "6 Clicks" Daily Activity     Outcome Measure Help from another person eating meals?: None Help from another person taking care of personal grooming?: None Help from another person toileting, which includes using toliet, bedpan, or urinal?: A Lot Help from another person bathing (including washing, rinsing, drying)?: A Lot Help from another person to put on and taking off regular upper body clothing?: None Help from another person to put on and taking off regular lower body clothing?: A Lot 6 Click Score: 18   End of Session Equipment Utilized During Treatment: Gait belt  Activity Tolerance: Patient tolerated treatment well Patient left: in bed;with call bell/phone within reach;with family/visitor present  OT Visit Diagnosis: Unsteadiness on feet (R26.81);History of falling (Z91.81);Muscle weakness (generalized) (M62.81)                Time: 8938-1017 OT Time Calculation (min): 15 min Charges:  OT General Charges $OT Visit: 1 Visit OT Evaluation $OT Eval Low Complexity: 1 Low G-Codes:       Hortencia Pilar 12/03/2017, 1:43 PM

## 2017-12-03 NOTE — Progress Notes (Signed)
VASCULAR LAB PRELIMINARY  PRELIMINARY  PRELIMINARY  PRELIMINARY  Carotid duplex completed.    Preliminary report:  1-39% ICA plaquing. Vertebral artery flow is antegrade.   Ahava Kissoon, RVT 12/03/2017, 9:33 AM

## 2017-12-04 DIAGNOSIS — G464 Cerebellar stroke syndrome: Secondary | ICD-10-CM

## 2017-12-04 LAB — GLUCOSE, CAPILLARY
Glucose-Capillary: 169 mg/dL — ABNORMAL HIGH (ref 65–99)
Glucose-Capillary: 186 mg/dL — ABNORMAL HIGH (ref 65–99)
Glucose-Capillary: 201 mg/dL — ABNORMAL HIGH (ref 65–99)

## 2017-12-04 MED ORDER — DIPHENHYDRAMINE-ZINC ACETATE 2-0.1 % EX CREA
TOPICAL_CREAM | Freq: Three times a day (TID) | CUTANEOUS | Status: DC | PRN
  Administered 2017-12-04 (×3): via TOPICAL
  Filled 2017-12-04: qty 28

## 2017-12-04 MED ORDER — FUROSEMIDE NICU ORAL SYRINGE 10 MG/ML: 60.0000 mg | Freq: Two times a day (BID) | ORAL | Status: DC

## 2017-12-04 MED ORDER — FUROSEMIDE 10 MG/ML IJ SOLN
60.0000 mg | Freq: Two times a day (BID) | INTRAMUSCULAR | Status: DC
  Administered 2017-12-04 – 2017-12-05 (×3): 60 mg via INTRAVENOUS
  Filled 2017-12-04 (×4): qty 8

## 2017-12-04 MED ORDER — GABAPENTIN 300 MG PO CAPS
300.0000 mg | ORAL_CAPSULE | Freq: Two times a day (BID) | ORAL | Status: DC | PRN
  Administered 2017-12-04 – 2017-12-05 (×3): 300 mg via ORAL
  Filled 2017-12-04 (×3): qty 1

## 2017-12-04 NOTE — Progress Notes (Signed)
STROKE TEAM PROGRESS NOTE   HISTORY OF PRESENT ILLNESS (per record) Heather [name] is an 46 y.o. female with stage V ESRD and a remote history of migraines who presents with severe unilateral throbbing headache. She states that the headache does not feel like her usual migraine. Headache began suddenly on Wednesday evening with 10/10 throbbing pain to the left side of her head. The pain was so severe that she could not get out of bed to call her husband. She denies having had any associated weakness, confusion or incoordination. Also without neck pain. She states that the headache did not feel like one of the migraines she had had in her remote past, despite its throbbing quality and unilaterality. She had no associated N/V, no photophobia, sonophobia or osmophobia and no neck stiffness. Also without fever or chills. She slept through the pain and by Thursday morning, the pain had improved to about 5/10 and she was able to go to work. Thursday evening, she went to bed, still with the headache.   On Friday morning, she awoke without a headache. However, later on she had sudden recurrence of the headache, which was again severe, so she went to the ED at [facility] for further evaluation. Upon presentation blood pressure was noted to be 200/114 and has since then came down to 177/103. Evaluation revealed no neurological deficits. 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 She was then transported to [facility] for further assessment, including MRI brain and vessel imaging. Currently, her headache is significantly better, rated at about 2/10.  PMHx includes new graft in place pending dialysis, secondary hyperparathyroidism, DM with peripheral neuropathy, morbid obesity, HTN and HLD.     SUBJECTIVE (INTERVAL HISTORY) Her family is at bedside, she still has a headache but improving, reviewed MRi images with patient and family, likely needs TEE and  loop   OBJECTIVE Temp:  [97.5 F (36.4 C)-98.4 F (36.9 C)] 98.1 F (36.7 C) (12/09 0710) Pulse Rate:  [79-89] 79 (12/09 0710) Cardiac Rhythm: Normal sinus rhythm (12/08 2030) Resp:  [18] 18 (12/09 0710) BP: (122-192)/(64-89) 168/70 (12/09 0710) SpO2:  [98 %-100 %] 100 % (12/09 0757) Weight:  [231 lb 14.8 oz (105.2 kg)] 231 lb 14.8 oz (105.2 kg) (12/09 0500)  CBC:  Recent Labs  Lab 11/28/17 0850 12/02/17 1330  WBC  --  8.9  NEUTROABS  --  6.3  HGB 9.6* 10.3*  HCT  --  32.9*  MCV  --  94.0  PLT  --  573    Basic Metabolic Panel:  Recent Labs  Lab 12/02/17 1330  NA 136  K 3.5  CL 104  CO2 25  GLUCOSE 118*  BUN 41*  CREATININE 4.93*  CALCIUM 8.3*    Lipid Panel:     Component Value Date/Time   CHOL 249 (H) 12/03/2017 0409   CHOL 277 02/13/2013   TRIG 158 (H) 12/03/2017 0409   TRIG 200 02/13/2013   HDL 48 12/03/2017 0409   CHOLHDL 5.2 12/03/2017 0409   VLDL 32 12/03/2017 0409   LDLCALC 169 (H) 12/03/2017 0409   LDLCALC 195 02/13/2013   HgbA1c:  Lab Results  Component Value Date   HGBA1C 6.4 (H) 12/03/2017   Urine Drug Screen: No results found for: LABOPIA, COCAINSCRNUR, LABBENZ, AMPHETMU, THCU, LABBARB  Alcohol Level No results found for: ETH  IMAGING   Ct Head Wo Contrast 12/02/2017 IMPRESSION:  1. New age-indeterminate infarct within  the right cerebellum, potentially subacute.  Consider MRI for further evaluation as clinically indicated.    MR MRV MRA Head:  IMPRESSION: 1. Subacute inferior right cerebellar nonhemorrhagic infarct. 2. Periventricular and subcortical white matter disease bilaterally is moderately advanced for age. 3. White matter disease is likely secondary to distal small vessel disease. 4. MRA demonstrates no significant proximal stenosis, aneurysm, or branch vessel occlusion within the circle of Willis. 5. Major venous structures scratched at the major venous structures are patent.  Transthoracic Echocardiogram   12/03/2017 Study Conclusions  - Left ventricle: The cavity size was normal. Wall thickness was   increased in a pattern of moderate LVH. Systolic function was   normal. The estimated ejection fraction was in the range of 60%   to 65%. Diastolic function is abnormal, indeterminant grade.   There is evidence of elevated LA pressure. Wall motion was   normal; there were no regional wall motion abnormalities. - Aortic valve: Valve area (VTI): 2.15 cm^2. Valve area (Vmax):   2.15 cm^2. Valve area (Vmean): 1.97 cm^2.    Bilateral Carotid Dopplers 12/03/2017 1-39% ICA plaquing. Vertebral artery flow is antegrade.      PHYSICAL EXAM Vitals:   12/04/17 0057 12/04/17 0500 12/04/17 0710 12/04/17 0757  BP: 122/66 (!) 146/64 (!) 168/70   Pulse: 84 81 79   Resp: 18 18 18    Temp: 98.4 F (36.9 C) 98.1 F (36.7 C) 98.1 F (36.7 C)   TempSrc: Oral Oral Oral   SpO2: 100% 100% 100% 100%  Weight:  231 lb 14.8 oz (105.2 kg)    Height:        PHYSICAL EXAM Physical exam: Exam: Gen: NAD Eyes: anicteric sclerae, moist conjunctivae                    CV: no MRG, no carotid bruits, no peripheral edema Mental Status: Alert, follows commands, good historian  Neuro: Detailed Neurologic Exam  Speech:    No aphasia, no dysarthria  Cranial Nerves:    The pupils are equal, round, and reactive to light.. Attempted, Fundi not visualized.  EOMI. No gaze preference. Visual fields full. Face symmetric, Tongue midline. Hearing intact to voice. Shoulder shrug intact  Motor Observation:    no involuntary movements noted. Tone appears normal.     Coordination: No Dysmetria  Strength:    5/5     Sensation:  Intact to LT  Plantars downgoing.      ASSESSMENT/PLAN [name] is a 46 y.o. female with history of end-stage renal disease, hyperparathyroidism, morbid obesity, neuropathy, hypertension, hyperlipidemia, migraine headaches, fibromyalgia, diabetes mellitus, and anemia of  chronic disease presenting with a severe headache. She did not receive IV t-PA due to no focal neurologic deficits and late presentation.   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  Resultant  No deficit  CT head - New age-indeterminate infarct within the right cerebellum, potentially subacute.   MRI head - Subacute inferior right cerebellar nonhemorrhagic infarct.  MRV - Unremarkable   MRA head - Unremarkable   Carotid Doppler - 1-39% ICA plaquing. Vertebral artery flow is antegrade.   2D Echo - EF 60-65%. No cardiac source of emboli identified.  LDL - 169  HgbA1c - 6.4  VTE prophylaxis - subcutaneous heparin Diet renal/carb modified with fluid restriction Diet-HS Snack? Nothing; Room service appropriate? Yes; Fluid consistency: Thin  No antithrombotic prior to admission, now on aspirin 325 mg daily  Patient counseled to be compliant  with her antithrombotic medications  Ongoing aggressive stroke risk factor management  Therapy recommendations:  pending  Disposition:  Pending  Hypertension  Occasional high blood pressures but Stable  Permissive hypertension (OK if < 220/120) but gradually normalize in 5-7 days  Long-term BP goal normotensive  Hyperlipidemia  Home meds:  No lipid lowering medications prior to admission  LDL 169, goal < 70  Now on Lipitor 80 mg daily  Continue statin at discharge  Diabetes  HgbA1c 6.4, goal < 7.0  Controlled  Other Stroke Risk Factors  Obesity, Body mass index is 34.25 kg/m., recommend weight loss, diet and exercise as appropriate   Family hx stroke (grandmother)   Other Active Problems  ESRD  Anemia of chronic disease  History of migraine headaches   Plan / Recommendations  TEE and Loop ordered  Continue Lipitor  Continue aspirin  Consider urine drug screen   Personally examined patient and images, and have participated in and made any corrections needed to history, physical, neuro  exam,assessment and plan as stated above.  I have personally obtained the history, evaluated lab date, reviewed imaging studies and agree with radiology interpretations.    Sarina Ill, MD Stroke Neurology  To contact Stroke Continuity provider, please refer to [email or web address] After hours, contact General Neurology

## 2017-12-04 NOTE — Progress Notes (Signed)
PROGRESS NOTE    Heather [name]  KGM:[id number] DOB: 1971-04-07 DOA: 12/02/2017 PCP: Briscoe Deutscher, DO  Brief Narrative: 46 y.o.femalewith medical history significant forchronic kidney disease stage V not yet on dialysis, insulin-dependent diabetes mellitus, hypertension, and anemia, now presenting to the emergency department for evaluation of a severe headache. Patient reports that she had experienced a similar severe headache 2 days earlier that resolved spontaneously after about a half an hour. She had a recurrence in the headache today, did not improve as quickly as the prior one did, and so she came into the ED for evaluation.Also reports some difficulty with chewing a french fry after the headache today. Denies choking.Denies fevers or chills, denies recent trauma, and denies focal numbness or weakness.  [facility]Upon arrival to the ED, patient is found to be chemistry panel is notable for a afebrile, saturating well on room air, initially hypertensive to 213/116, and with vitals otherwise stable. BUN of 41 and creatinine of 4.93. CBC features a stable normocytic anemia with hemoglobin of 10.3. Noncontrast head CT is notable for a new age-indeterminate infarct within the right cerebellum, possibly subacute. Neurology was consulted by the ED physician, recommended medical admission to [facility] was CTA head and neck MRI brain. Blood pressure trended down spontaneously, patient remained hemodynamically stable, and will be admitted to the telemetry unit for ongoing evaluation and headache and dysphagia with CT evidence for ischemic CVA, likely subacute.    Assessment & Plan:   Principal Problem:   Stroke (cerebrum) ([place]) Active Problems:   Diabetes mellitus, type II, insulin dependent (HCC)   Essential hypertension   Anemia in chronic kidney disease   Chronic kidney disease, stage V (HCC)   Anemia of chronic disease   Hypertensive  urgency   Acute intractable headache  -Right cerebellar subacute infarct-MRA and MRV unremarkable.  MRI of the head shows subacute right cerebellar infarct no hemorrhage noted carotid Doppler shows no flow limitation. echo  shows EF 60-65% no cardiac emboli noted. -ckd 5 htn Dm hyperlipidemia Plan-discussed with neurology loop recording and TEE ordered continue current medications including aspirin and statin. DVT prophylaxis: Heparin Code Status: Full code Family Communication: Discussed with patient and her husband Disposition Plan: TBD  Consultants:  Neurology Procedures: None Antimicrobials: None  Subjective: Patient still complaining of some headache but better than yesterday.  She takes a higher dose of Lasix at home 80 mg twice a day which she thinks it is for swelling.  She reports that she has never been told she has heart failure.  But she does have stage V CKD.   Objective: Sitting by the side of the bed in no acute distress husband by the bedside Vitals:   12/04/17 0057 12/04/17 0500 12/04/17 0710 12/04/17 0757  BP: 122/66 (!) 146/64 (!) 168/70   Pulse: 84 81 79   Resp: 18 18 18    Temp: 98.4 F (36.9 C) 98.1 F (36.7 C) 98.1 F (36.7 C)   TempSrc: Oral Oral Oral   SpO2: 100% 100% 100% 100%  Weight:  105.2 kg (231 lb 14.8 oz)    Height:        Intake/Output Summary (Last 24 hours) at 12/04/2017 1216 Last data filed at 12/03/2017 2200 Gross per 24 hour  Intake 480 ml  Output -  Net 480 ml   Filed Weights   12/02/17 2055 12/03/17 0500 12/04/17 0500  Weight: 107.6 kg (237 lb 3.4 oz) 107 kg (235 lb 14.3 oz) 105.2  kg (231 lb 14.8 oz)    Examination:  General exam: Appears calm and comfortable  Respiratory system: Clear to auscultation. Respiratory effort normal. Cardiovascular system: S1 & S2 heard, RRR. No JVD, murmurs, rubs, gallops or clicks. No pedal edema. Gastrointestinal system: Abdomen is nondistended, soft and nontender. No organomegaly or masses  felt. Normal bowel sounds heard. Central nervous system: Alert and oriented. No focal neurological deficits. Extremities: Symmetric 5 x 5 power. Skin: No rashes, lesions or ulcers Psychiatry: Judgement and insight appear normal. Mood & affect appropriate.     Data Reviewed: I have personally reviewed following labs and imaging studies  CBC: Recent Labs  Lab 11/28/17 0850 12/02/17 1330  WBC  --  8.9  NEUTROABS  --  6.3  HGB 9.6* 10.3*  HCT  --  32.9*  MCV  --  94.0  PLT  --  093   Basic Metabolic Panel: Recent Labs  Lab 12/02/17 1330  NA 136  K 3.5  CL 104  CO2 25  GLUCOSE 118*  BUN 41*  CREATININE 4.93*  CALCIUM 8.3*   GFR: Estimated Creatinine Clearance: 18.2 mL/min (A) (by C-G formula based on SCr of 4.93 mg/dL (H)). Liver Function Tests: No results for input(s): AST, ALT, ALKPHOS, BILITOT, PROT, ALBUMIN in the last 168 hours. No results for input(s): LIPASE, AMYLASE in the last 168 hours. No results for input(s): AMMONIA in the last 168 hours. Coagulation Profile: No results for input(s): INR, PROTIME in the last 168 hours. Cardiac Enzymes: No results for input(s): CKTOTAL, CKMB, CKMBINDEX, TROPONINI in the last 168 hours. BNP (last 3 results) No results for input(s): PROBNP in the last 8760 hours. HbA1C: Recent Labs    12/03/17 0409  HGBA1C 6.4*   CBG: Recent Labs  Lab 12/03/17 1141 12/03/17 1609 12/03/17 2125 12/04/17 0659 12/04/17 1130  GLUCAP 109* 138* 151* 169* 186*   Lipid Profile: Recent Labs    12/03/17 0409  CHOL 249*  HDL 48  LDLCALC 169*  TRIG 158*  CHOLHDL 5.2   Thyroid Function Tests: No results for input(s): TSH, T4TOTAL, FREET4, T3FREE, THYROIDAB in the last 72 hours. Anemia Panel: No results for input(s): VITAMINB12, FOLATE, FERRITIN, TIBC, IRON, RETICCTPCT in the last 72 hours. Sepsis Labs: No results for input(s): PROCALCITON, LATICACIDVEN in the last 168 hours.  No results found for this or any previous visit (from  the past 240 hour(s)).       Radiology Studies: Ct Head Wo Contrast  Result Date: 12/02/2017 CLINICAL DATA:  Acute onset headache.  History of migraines. EXAM: CT HEAD WITHOUT CONTRAST TECHNIQUE: Contiguous axial images were obtained from the base of the skull through the vertex without intravenous contrast. COMPARISON:  MRI brain dated April 22, 2015. CT head dated April 21, 2015. FINDINGS: Brain: New age-indeterminate infarct within the right cerebellum. No evidence of hemorrhage, hydrocephalus, extra-axial collection or mass lesion/mass effect. Vascular: Atherosclerotic vascular calcification of the carotid siphons. No hyperdense vessel. Skull: Normal. Negative for fracture or focal lesion. Sinuses/Orbits: Partial opacification of the left mastoid air cells. The orbits are unremarkable. Other: None. IMPRESSION: 1. New age-indeterminate infarct within the right cerebellum, potentially subacute. Consider MRI for further evaluation as clinically indicated. Electronically Signed   By: Titus Dubin M.D.   On: 12/02/2017 14:00   Mr Brain Wo Contrast  Result Date: 12/03/2017 CLINICAL DATA:  Abnormal CT. Headaches for 2 days. Difficulty chewing and eating. EXAM: MRI HEAD WITHOUT CONTRAST MRA HEAD WITHOUT CONTRAST MRV HEAD WITHOUT CONTRAST TECHNIQUE: Multiplanar,  multiecho pulse sequences of the brain and surrounding structures were obtained without intravenous contrast. Angiographic images of the head were obtained using MRA and MRV technique without contrast. COMPARISON:  None. FINDINGS: MRI HEAD FINDINGS Brain: The diffusion-weighted images confirm an acute/subacute nonhemorrhagic infarct involving the inferior right cerebellum. No other acute infarct is present. Associated T2 signal changes are compatible with a subacute time frame. Age advanced atrophy is present. Periventricular and subcortical T2 changes bilaterally are moderately advanced for age. The internal auditory canals are normal  bilaterally. White matter changes extend into the brainstem Vascular: Flow is present in the major intracranial arteries. Skull and upper cervical spine: The skullbase is normal. The craniocervical junction is within normal limits. Sinuses/Orbits: The paranasal sinuses are clear. There is fluid in left mastoid air cells. No obstructing nasopharyngeal lesion is present. Bilateral lens replacements are present. MRA HEAD FINDINGS Time-of-flight MRA head demonstrates normal signal in the internal carotid artery is from the high cervical segments through the ICA termini bilaterally. The A1 and M1 segments are normal. The anterior communicating artery is patent. MCA bifurcations are within normal limits. ACA and MCA branch vessels are unremarkable. The right vertebral artery is dominant. Right PICA is visualized but the origin is below the field of view. The left AICA is dominant. The basilar artery is normal. Both posterior cerebral artery is originate from the basilar tip. PCA branch vessels are within normal limits. MRV HEAD FINDINGS MR venogram demonstrates normal patency of the dural sinuses. The right transverse sinus is dominant. The straight sinus deep cerebral veins are intact. Cortical veins are unremarkable. IMPRESSION: 1. Subacute inferior right cerebellar nonhemorrhagic infarct. 2. Periventricular and subcortical white matter disease bilaterally is moderately advanced for age. 3. White matter disease is likely secondary to distal small vessel disease. 4. MRA demonstrates no significant proximal stenosis, aneurysm, or branch vessel occlusion within the circle of Willis. 5. Major venous structures scratched at the major venous structures are patent. Electronically Signed   By: San Morelle M.D.   On: 12/03/2017 21:02   Mr Jodene Nam Head Wo Contrast  Result Date: 12/03/2017 CLINICAL DATA:  Abnormal CT. Headaches for 2 days. Difficulty chewing and eating. EXAM: MRI HEAD WITHOUT CONTRAST MRA HEAD WITHOUT  CONTRAST MRV HEAD WITHOUT CONTRAST TECHNIQUE: Multiplanar, multiecho pulse sequences of the brain and surrounding structures were obtained without intravenous contrast. Angiographic images of the head were obtained using MRA and MRV technique without contrast. COMPARISON:  None. FINDINGS: MRI HEAD FINDINGS Brain: The diffusion-weighted images confirm an acute/subacute nonhemorrhagic infarct involving the inferior right cerebellum. No other acute infarct is present. Associated T2 signal changes are compatible with a subacute time frame. Age advanced atrophy is present. Periventricular and subcortical T2 changes bilaterally are moderately advanced for age. The internal auditory canals are normal bilaterally. White matter changes extend into the brainstem Vascular: Flow is present in the major intracranial arteries. Skull and upper cervical spine: The skullbase is normal. The craniocervical junction is within normal limits. Sinuses/Orbits: The paranasal sinuses are clear. There is fluid in left mastoid air cells. No obstructing nasopharyngeal lesion is present. Bilateral lens replacements are present. MRA HEAD FINDINGS Time-of-flight MRA head demonstrates normal signal in the internal carotid artery is from the high cervical segments through the ICA termini bilaterally. The A1 and M1 segments are normal. The anterior communicating artery is patent. MCA bifurcations are within normal limits. ACA and MCA branch vessels are unremarkable. The right vertebral artery is dominant. Right PICA is visualized but the origin  is below the field of view. The left AICA is dominant. The basilar artery is normal. Both posterior cerebral artery is originate from the basilar tip. PCA branch vessels are within normal limits. MRV HEAD FINDINGS MR venogram demonstrates normal patency of the dural sinuses. The right transverse sinus is dominant. The straight sinus deep cerebral veins are intact. Cortical veins are unremarkable. IMPRESSION:  1. Subacute inferior right cerebellar nonhemorrhagic infarct. 2. Periventricular and subcortical white matter disease bilaterally is moderately advanced for age. 3. White matter disease is likely secondary to distal small vessel disease. 4. MRA demonstrates no significant proximal stenosis, aneurysm, or branch vessel occlusion within the circle of Willis. 5. Major venous structures scratched at the major venous structures are patent. Electronically Signed   By: San Morelle M.D.   On: 12/03/2017 21:02   Mr Mrv Head Wo Cm  Result Date: 12/03/2017 CLINICAL DATA:  Abnormal CT. Headaches for 2 days. Difficulty chewing and eating. EXAM: MRI HEAD WITHOUT CONTRAST MRA HEAD WITHOUT CONTRAST MRV HEAD WITHOUT CONTRAST TECHNIQUE: Multiplanar, multiecho pulse sequences of the brain and surrounding structures were obtained without intravenous contrast. Angiographic images of the head were obtained using MRA and MRV technique without contrast. COMPARISON:  None. FINDINGS: MRI HEAD FINDINGS Brain: The diffusion-weighted images confirm an acute/subacute nonhemorrhagic infarct involving the inferior right cerebellum. No other acute infarct is present. Associated T2 signal changes are compatible with a subacute time frame. Age advanced atrophy is present. Periventricular and subcortical T2 changes bilaterally are moderately advanced for age. The internal auditory canals are normal bilaterally. White matter changes extend into the brainstem Vascular: Flow is present in the major intracranial arteries. Skull and upper cervical spine: The skullbase is normal. The craniocervical junction is within normal limits. Sinuses/Orbits: The paranasal sinuses are clear. There is fluid in left mastoid air cells. No obstructing nasopharyngeal lesion is present. Bilateral lens replacements are present. MRA HEAD FINDINGS Time-of-flight MRA head demonstrates normal signal in the internal carotid artery is from the high cervical segments  through the ICA termini bilaterally. The A1 and M1 segments are normal. The anterior communicating artery is patent. MCA bifurcations are within normal limits. ACA and MCA branch vessels are unremarkable. The right vertebral artery is dominant. Right PICA is visualized but the origin is below the field of view. The left AICA is dominant. The basilar artery is normal. Both posterior cerebral artery is originate from the basilar tip. PCA branch vessels are within normal limits. MRV HEAD FINDINGS MR venogram demonstrates normal patency of the dural sinuses. The right transverse sinus is dominant. The straight sinus deep cerebral veins are intact. Cortical veins are unremarkable. IMPRESSION: 1. Subacute inferior right cerebellar nonhemorrhagic infarct. 2. Periventricular and subcortical white matter disease bilaterally is moderately advanced for age. 3. White matter disease is likely secondary to distal small vessel disease. 4. MRA demonstrates no significant proximal stenosis, aneurysm, or branch vessel occlusion within the circle of Willis. 5. Major venous structures scratched at the major venous structures are patent. Electronically Signed   By: San Morelle M.D.   On: 12/03/2017 21:02        Scheduled Meds: . aspirin  300 mg Rectal Daily   Or  . aspirin  325 mg Oral Daily  . atorvastatin  80 mg Oral q1800  . budesonide  0.5 mg Nebulization BID  . furosemide  80 mg Intravenous BID  . heparin  5,000 Units Subcutaneous Q8H  . insulin aspart  0-9 Units Subcutaneous TID WC  .  insulin glargine  25 Units Subcutaneous Daily  . promethazine  25 mg Intravenous Once   Continuous Infusions:   LOS: 2 days      Georgette Shell, MD [facility]  If 7PM-7AM, please contact night-coverage [email or web address] Password [email or web address] 12/04/2017, 12:16 PM

## 2017-12-05 ENCOUNTER — Encounter (HOSPITAL_COMMUNITY): Payer: Self-pay | Admitting: *Deleted

## 2017-12-05 ENCOUNTER — Inpatient Hospital Stay (HOSPITAL_COMMUNITY): Payer: Managed Care, Other (non HMO)

## 2017-12-05 ENCOUNTER — Encounter (HOSPITAL_COMMUNITY)
Admission: EM | Disposition: A | Discharge status: Home / Self Care | Payer: Self-pay | Source: Home / Self Care | Attending: Internal Medicine

## 2017-12-05 DIAGNOSIS — I639 Cerebral infarction, unspecified: Secondary | ICD-10-CM

## 2017-12-05 DIAGNOSIS — I6389 Other cerebral infarction: Secondary | ICD-10-CM

## 2017-12-05 DIAGNOSIS — I6300 Cerebral infarction due to thrombosis of unspecified precerebral artery: Secondary | ICD-10-CM

## 2017-12-05 HISTORY — PX: LOOP RECORDER INSERTION: EP1214

## 2017-12-05 HISTORY — PX: TEE WITHOUT CARDIOVERSION: SHX5443

## 2017-12-05 LAB — GLUCOSE, CAPILLARY
GLUCOSE-CAPILLARY: 103 mg/dL — AB (ref 65–99)
GLUCOSE-CAPILLARY: 119 mg/dL — AB (ref 65–99)
GLUCOSE-CAPILLARY: 155 mg/dL — AB (ref 65–99)
GLUCOSE-CAPILLARY: 176 mg/dL — AB (ref 65–99)
GLUCOSE-CAPILLARY: 95 mg/dL (ref 65–99)

## 2017-12-05 SURGERY — ECHOCARDIOGRAM, TRANSESOPHAGEAL
Anesthesia: Moderate Sedation

## 2017-12-05 SURGERY — LOOP RECORDER INSERTION
Anesthesia: LOCAL | Site: Chest

## 2017-12-05 MED ORDER — LIDOCAINE-EPINEPHRINE 1 %-1:100000 IJ SOLN
INTRAMUSCULAR | Status: AC
  Filled 2017-12-05: qty 1

## 2017-12-05 MED ORDER — FENTANYL CITRATE (PF) 100 MCG/2ML IJ SOLN
INTRAMUSCULAR | Status: AC
  Filled 2017-12-05: qty 2

## 2017-12-05 MED ORDER — SODIUM CHLORIDE 0.9 % IV SOLN: INTRAVENOUS | Status: DC

## 2017-12-05 MED ORDER — BUTAMBEN-TETRACAINE-BENZOCAINE 2-2-14 % EX AERO
INHALATION_SPRAY | CUTANEOUS | Status: DC | PRN
Start: 2017-12-05 — End: 2017-12-05
  Administered 2017-12-05: 2 via TOPICAL

## 2017-12-05 MED ORDER — MIDAZOLAM HCL 5 MG/ML IJ SOLN
INTRAMUSCULAR | Status: AC
  Filled 2017-12-05: qty 2

## 2017-12-05 MED ORDER — MIDAZOLAM HCL 10 MG/2ML IJ SOLN
INTRAMUSCULAR | Status: DC | PRN
Start: 2017-12-05 — End: 2017-12-05
  Administered 2017-12-05 (×2): 2 mg via INTRAVENOUS

## 2017-12-05 MED ORDER — FENTANYL CITRATE (PF) 100 MCG/2ML IJ SOLN
INTRAMUSCULAR | Status: DC | PRN
  Administered 2017-12-05 (×2): 25 ug via INTRAVENOUS

## 2017-12-05 MED ORDER — ACETAMINOPHEN 80 MG PO CHEW: 500.0000 mg | CHEWABLE_TABLET | ORAL | Status: DC | PRN

## 2017-12-05 MED ORDER — ONDANSETRON HCL 4 MG/2ML IJ SOLN
4.0000 mg | Freq: Four times a day (QID) | INTRAMUSCULAR | Status: DC | PRN
  Administered 2017-12-05: 4 mg via INTRAVENOUS
  Filled 2017-12-05: qty 2

## 2017-12-05 MED ORDER — DIPHENHYDRAMINE HCL 50 MG/ML IJ SOLN
INTRAMUSCULAR | Status: AC
  Filled 2017-12-05: qty 1

## 2017-12-05 MED ORDER — LIDOCAINE-EPINEPHRINE 1 %-1:100000 IJ SOLN
INTRAMUSCULAR | Status: DC | PRN
  Administered 2017-12-05: 20 mL

## 2017-12-05 SURGICAL SUPPLY — 2 items
LOOP REVEAL LINQSYS (Prosthesis & Implant Heart) ×2 IMPLANT
PACK LOOP INSERTION (CUSTOM PROCEDURE TRAY) ×2 IMPLANT

## 2017-12-05 NOTE — Consult Note (Signed)
ELECTROPHYSIOLOGY CONSULT NOTE  Patient ID: WHITNEY HILLEGASS MRN: [id number], DOB/AGE: 1971/04/04   Admit date: 12/02/2017 Date of Consult: 12/05/2017  Primary Physician: Briscoe Deutscher, DO Primary Cardiologist: none Reason for Consultation: Cryptogenic stroke ; recommendations regarding Implantable Loop,  Recorder requested by D. Ahern  History of Present Illness Heather [name] was admitted on 12/02/2017 with c/o unusual headache, persistent and different from her migraines.  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  She first developed symptoms while at home.  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  The patient has been monitored on telemetry which has demonstrated sinus rhythm with no arrhythmias.  Inpatient stroke work-up is to be completed with a TEE, not yet scheduled.   Echocardiogram this admission demonstrated  12/03/2017 Study Conclusions - Left ventricle: The cavity size was normal. Wall thickness was increased in a pattern of moderate LVH. Systolic function was normal. The estimated ejection fraction was in the range of 60% to 65%. Diastolic function is abnormal, indeterminant grade. There is evidence of elevated LA pressure. Wall motion was normal; there were no regional wall motion abnormalities. - Aortic valve: Valve area (VTI): 2.15 cm^2. Valve area (Vmax): 2.15 cm^2. Valve area (Vmean): 1.97 cm^2.  Bilateral Carotid Dopplers 12/03/2017 1-39% ICA plaquing. Vertebral artery flow is antegrade   Lab work is reviewed.  Prior to admission, the patient denies chest pain, shortness of breath, dizziness, palpitations, or syncope.She feels fully recovered from their stroke  with plans to home at discharge as far as the patient has been told.     Past Medical History:  Diagnosis Date  . Allergy   . Anemia of chronic disease   . B12 deficiency 05/10/2014  . Blood transfusion without reported diagnosis   . Bronchitis   . Chronic constipation   . Chronic kidney disease   . Constipation   . Diabetic neuropathy, painful ([place]) 07/13/2013   Stop lyrica due to cost & SE of drowsy. Gabapentin helps Stay off hydrocodone: pt states she thinks "she was addicted" to med. "I don't want that anymore."   . DM2 (diabetes mellitus, type 2) ([place])   . Dyspnea    with exertion  . Fibromyalgia   . Gastroparesis due to DM   . GERD (gastroesophageal reflux disease)   . Headache   . Heart murmur, systolic 40/08/8118   Grade 1/6 ejection murmur per [facility] notes.   . Hyperlipemia   . Hypertension   . IBS (irritable bowel syndrome)   . Infertility   . Leiomyoma of uterus   . Morbid obesity due to excess calories ([place]) 11/16/2016  . Neuropathy   . Obesity   . Pancreatitis   . 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 Right-sided low back pain without sciatica 12/08/2015  . Secondary hyperthyroidism 11/27/2013   Followed by [facility], notes reviewed 01/01/15. PTH had improved with oral vitamin D: 133 to 85. Last 155-probably not taking vit D.    [name] Upper airway cough syndrome 11/15/2016   rec off acei/ on pepcid hs 11/15/2016 >>>      Surgical History:  Past Surgical History:  Procedure Laterality Date  . AV FISTULA PLACEMENT Left 05/09/2017   Procedure: INSERTION OF ARTERIOVENOUS (AV) GRAFT ARM (ARTEGRAFT);  Surgeon: Conrad Fanshawe, MD;  Location: [place];  Service: Vascular;  Laterality: Left;  . BASCILIC VEIN TRANSPOSITION Left 08/31/2016   Procedure: BASILIC VEIN TRANSPOSITION FIRST STAGE;  Surgeon: Conrad Provencal, MD;  Location: [place];  Service: Vascular;  Laterality: Left;  . FOOT SURGERY Right    t"ook bone out- maybe hammer toe"  . LIGATION  OF ARTERIOVENOUS  FISTULA Left 05/09/2017   Procedure: LIGATION OF ARTERIOVENOUS  FISTULA;  Surgeon: Conrad Frankfort, MD;  Location: [place];  Service: Vascular;  Laterality: Left;  . MYOMECTOMY    . VITRECTOMY Bilateral      Medications Prior to Admission  Medication Sig Dispense Refill Last Dose  . albuterol (PROVENTIL HFA;VENTOLIN HFA) 108 (90 Base) MCG/ACT inhaler Inhale 2 puffs into the lungs every 6 (six) hours as needed for wheezing or shortness of breath. 1 Inhaler 2 prn  . amLODipine (NORVASC) 10 MG tablet Take 10 mg by mouth daily.   12/02/2017 at Unknown time  . calcitRIOL (ROCALTROL) 0.25 MCG capsule Take 0.5 mcg by mouth daily.    12/02/2017 at Unknown time  . epoetin alfa (EPOGEN,PROCRIT) 16109 UNIT/ML injection Inject 30,000 Units into the vein every 14 (fourteen) days.    11/28/2017  . fluticasone (FLONASE) 50 MCG/ACT nasal spray Place 2 sprays daily into both nostrils. 48 g 1 12/02/2017 at Unknown time  . fluticasone (FLOVENT HFA) 220 MCG/ACT inhaler Inhale 2 puffs into the lungs daily. 1 Inhaler 12 12/02/2017 at Unknown time  . furosemide (LASIX) 80 MG tablet Take 1 tablet (80 mg total) by mouth 2 (two) times daily. (Patient taking differently: Take 160 mg by mouth 2 (two) times daily. ) 60 tablet 0 12/02/2017 at Unknown time  . gabapentin (NEURONTIN) 300 MG capsule Take 1 capsule (300 mg total) 2 (two) times daily as needed by mouth. 60 capsule 3 prn  . insulin aspart (NOVOLOG) 100 UNIT/ML FlexPen Inject under the skin 15 minutes before meals: 6 units before Breakfast, 8 units before Lunch, 8 units before Supper plus scale BG-150/25 up to 60 units daily   12/02/2017 at Unknown time  . insulin degludec (TRESIBA FLEXTOUCH) 100 UNIT/ML SOPN FlexTouch Pen Inject 28 Units into the skin daily with breakfast.    12/02/2017 at Unknown time  . ipratropium (ATROVENT) 0.03 % nasal spray PLACE 2 SPRAYS INTO BOTH NOSTRILS EVERY 12 (TWELVE) HOURS. 16 mL 2 12/02/2017 at Unknown time  . linaclotide (LINZESS)  145 MCG CAPS capsule Take 1 capsule (145 mcg total) by mouth daily before breakfast. 30 capsule 5 12/02/2017 at Unknown time  . lisinopril (PRINIVIL,ZESTRIL) 20 MG tablet Take 20 mg by mouth daily.   12/02/2017 at Unknown time  . cetirizine (ZYRTEC) 5 MG tablet 1/2 po daily prn allergic rhinitis (Patient not taking: Reported on 12/03/2017) 24 tablet 0 Not Taking at Unknown time  . Continuous Blood Gluc Sensor ([place]) MISC by Does not apply route.   Taking  . glucose blood (ONETOUCH VERIO) test strip Test 4 times daily. (Patient taking differently: 1 each by Other route 3 (three) times daily. ) 100 each 12 Taking    Inpatient Medications:  . aspirin  300 mg Rectal Daily   Or  . aspirin  325 mg Oral Daily  . atorvastatin  80 mg Oral q1800  . budesonide  0.5 mg Nebulization BID  . furosemide  60 mg Intravenous BID  . heparin  5,000 Units Subcutaneous Q8H  . insulin aspart  0-9 Units Subcutaneous  TID WC  . insulin glargine  25 Units Subcutaneous Daily    Allergies:  Allergies  Allergen Reactions  . Ibuprofen Other (See Comments)    CKD stage 3. Should avoid.  . Dilaudid [Hydromorphone Hcl] Itching and Other (See Comments)    Can take with Benadryl.    Social History   Socioeconomic History  . Marital status: Married    Spouse name: Not on file  . Number of children: 1  . Years of education: college  . Highest education level: Not on file  Social Needs  . Financial resource strain: Not on file  . Food insecurity - worry: Not on file  . Food insecurity - inability: Not on file  . Transportation needs - medical: Not on file  . Transportation needs - non-medical: Not on file  Occupational History  . Occupation: Armed forces technical officer: [employer]  Tobacco Use  . Smoking status: Never Smoker  . Smokeless tobacco: Never Used  Substance and Sexual Activity  . Alcohol use: No    Alcohol/week: 0.0 oz  . Drug use: No  . Sexual activity: Yes     Partners: Male    Birth control/protection: None  Other Topics Concern  . Not on file  Social History Narrative   Patient lives with fiance. She has 1 grown son. Works full-time, she Paramedic for attorney.   Caffeine Use: 2 cups daily; sodas occasionally     Family History  Problem Relation Age of Onset  . Colon polyps Mother   . Diabetes Mother   . Heart murmur Father   . Hypertension Father   . Diabetes Sister   . Kidney failure Sister   . Diabetes Brother   . Retinal degeneration Brother   . Heart murmur Son   . Stroke Paternal Grandmother   . Diabetes Sister       Review of Systems: All other systems reviewed and are otherwise negative except as noted above.  Physical Exam: Vitals:   12/04/17 1700 12/04/17 2132 12/05/17 0347 12/05/17 0600  BP: (!) 169/91  132/76   Pulse: 87  81   Resp: 18     Temp: 98.4 F (36.9 C)  98.6 F (37 C)   TempSrc: Oral  Oral   SpO2: 100% 97% 100%   Weight:    229 lb 8 oz (104.1 kg)  Height:        GEN- The patient is well appearing, alert and oriented x 3 today.   Head- normocephalic, atraumatic Eyes-  Sclera clear, conjunctiva pink Ears- hearing intact Oropharynx- clear Neck- supple Lungs- CTA b/l, normal work of breathing Heart- RRR, no murmurs, rubs or gallops  GI- soft, NT, ND, obese Extremities- no clubbing, cyanosis, or edema MS- no significant deformity or atrophy Skin- no rash or lesion Psych- euthymic mood, full affect   Labs:   Lab Results  Component Value Date   WBC 8.9 12/02/2017   HGB 10.3 (L) 12/02/2017   HCT 32.9 (L) 12/02/2017   MCV 94.0 12/02/2017   PLT 358 12/02/2017    Recent Labs  Lab 12/02/17 1330  NA 136  K 3.5  CL 104  CO2 25  BUN 41*  CREATININE 4.93*  CALCIUM 8.3*  GLUCOSE 118*   Lab Results  Component Value Date   CKTOTAL 63 09/20/2011   CKMB 2.0 09/20/2011   TROPONINI <0.03 02/17/2017   Lab Results  Component Value Date   CHOL 249 (H) 12/03/2017  CHOL 220 (H)  04/17/2015   CHOL 281 (H) 09/11/2014   Lab Results  Component Value Date   HDL 48 12/03/2017   HDL 31.30 (L) 04/17/2015   HDL 29.60 (L) 09/11/2014   Lab Results  Component Value Date   LDLCALC 169 (H) 12/03/2017   LDLCALC 150 (H) 04/26/2014   LDLCALC 195 02/13/2013   Lab Results  Component Value Date   TRIG 158 (H) 12/03/2017   TRIG (H) 04/17/2015    404.0 Triglyceride is over 400; calculations on Lipids are invalid.   TRIG (H) 09/11/2014    445.0 Triglyceride is over 400; calculations on Lipids are invalid.   Lab Results  Component Value Date   CHOLHDL 5.2 12/03/2017   CHOLHDL 7 04/17/2015   CHOLHDL 9 09/11/2014   Lab Results  Component Value Date   LDLDIRECT 120.0 04/17/2015   LDLDIRECT 171.4 09/11/2014    Lab Results  Component Value Date   DDIMER 1.04 (H) 12/03/2017     Radiology/Studies:  Ct Head Wo Contrast Result Date: 12/02/2017 CLINICAL DATA:  Acute onset headache.  History of migraines. EXAM: CT HEAD WITHOUT CONTRAST TECHNIQUE: Contiguous axial images were obtained from the base of the skull through the vertex without intravenous contrast. COMPARISON:  MRI brain dated April 22, 2015. CT head dated April 21, 2015. FINDINGS: Brain: New age-indeterminate infarct within the right cerebellum. No evidence of hemorrhage, hydrocephalus, extra-axial collection or mass lesion/mass effect. Vascular: Atherosclerotic vascular calcification of the carotid siphons. No hyperdense vessel. Skull: Normal. Negative for fracture or focal lesion. Sinuses/Orbits: Partial opacification of the left mastoid air cells. The orbits are unremarkable. Other: None. IMPRESSION: 1. New age-indeterminate infarct within the right cerebellum, potentially subacute. Consider MRI for further evaluation as clinically indicated. Electronically Signed   By: Titus Dubin M.D.   On: 12/02/2017 14:00    Mr Brain Wo Contrast Result Date: 12/03/2017 CLINICAL DATA:  Abnormal CT. Headaches for 2 days.  Difficulty chewing and eating. EXAM: MRI HEAD WITHOUT CONTRAST MRA HEAD WITHOUT CONTRAST MRV HEAD WITHOUT CONTRAST TECHNIQUE: Multiplanar, multiecho pulse sequences of the brain and surrounding structures were obtained without intravenous contrast. Angiographic images of the head were obtained using MRA and MRV technique without contrast. COMPARISON:  None. FINDINGS: MRI HEAD FINDINGS Brain: The diffusion-weighted images confirm an acute/subacute nonhemorrhagic infarct involving the inferior right cerebellum. No other acute infarct is present. Associated T2 signal changes are compatible with a subacute time frame. Age advanced atrophy is present. Periventricular and subcortical T2 changes bilaterally are moderately advanced for age. The internal auditory canals are normal bilaterally. White matter changes extend into the brainstem Vascular: Flow is present in the major intracranial arteries. Skull and upper cervical spine: The skullbase is normal. The craniocervical junction is within normal limits. Sinuses/Orbits: The paranasal sinuses are clear. There is fluid in left mastoid air cells. No obstructing nasopharyngeal lesion is present. Bilateral lens replacements are present. MRA HEAD FINDINGS Time-of-flight MRA head demonstrates normal signal in the internal carotid artery is from the high cervical segments through the ICA termini bilaterally. The A1 and M1 segments are normal. The anterior communicating artery is patent. MCA bifurcations are within normal limits. ACA and MCA branch vessels are unremarkable. The right vertebral artery is dominant. Right PICA is visualized but the origin is below the field of view. The left AICA is dominant. The basilar artery is normal. Both posterior cerebral artery is originate from the basilar tip. PCA branch vessels are within normal limits. MRV HEAD FINDINGS MR  venogram demonstrates normal patency of the dural sinuses. The right transverse sinus is dominant. The straight  sinus deep cerebral veins are intact. Cortical veins are unremarkable. IMPRESSION: 1. Subacute inferior right cerebellar nonhemorrhagic infarct. 2. Periventricular and subcortical white matter disease bilaterally is moderately advanced for age. 3. White matter disease is likely secondary to distal small vessel disease. 4. MRA demonstrates no significant proximal stenosis, aneurysm, or branch vessel occlusion within the circle of Willis. 5. Major venous structures scratched at the major venous structures are patent. Electronically Signed   By: San Morelle M.D.   On: 12/03/2017 21:02      12-lead ECG SR All prior EKG's in EPIC reviewed with no documented atrial fibrillation  Telemetry reviewed by Dr. Lovena Le, SR  Assessment and Plan:  1. Cryptogenic stroke The patient presents with cryptogenic stroke.  The patient is pending TEE scheduling.  Dr. Lovena Le has seen and examined the patient, he spoke at length with the patient about monitoring for afib with either a 30 day event monitor or an implantable loop recorder.  Risks, benefits, and alteratives to implantable loop recorder were discussed with the patient today.   At this time, the patient is very clear in their decision to proceed with implantable loop recorder once TEE is completed if unrevealing.   Wound care was reviewed with the patient (keep incision clean and dry for 3 days).  Wound check will be scheduled for the patient  Please call with questions.   Renee Dyane Dustman, PA-C 12/05/2017  EP attending  Patient seen and examined.  Agree with findings above.  The patient has had a cryptogenic stroke.  She will undergo TEE and if negative proceed with insertion of an implantable loop recorder.  I discussed the indications, risks, benefits, goals and expectations of the procedure and she wishes to proceed.  Crissie Sickles, MD

## 2017-12-05 NOTE — Progress Notes (Signed)
STROKE TEAM PROGRESS NOTE   HISTORY OF PRESENT ILLNESS (per record) Heather Heather [name] is an 46 y.o. female with stage V ESRD and a remote history of migraines who presents with severe unilateral throbbing headache. She states that the headache does not feel like her usual migraine. Headache began suddenly on Wednesday evening with 10/10 throbbing pain to the left side of her head. The pain was so severe that she could not get out of bed to call her husband. She denies having had any associated weakness, confusion or incoordination. Also without neck pain. She states that the headache did not feel like one of the migraines she had had in her remote past, despite its throbbing quality and unilaterality. She had no associated N/V, no photophobia, sonophobia or osmophobia and no neck stiffness. Also without fever or chills. She slept through the pain and by Thursday morning, the pain had improved to about 5/10 and she was able to go to work. Thursday evening, she went to bed, still with the headache.   On Friday morning, she awoke without a headache. However, later on she had sudden recurrence of the headache, which was again severe, so she went to the ED at [facility] for further evaluation. Upon presentation blood pressure was noted to be 200/114 and has since then came down to 177/103. Evaluation revealed no neurological deficits. 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 She was then transported to [facility] for further assessment, including MRI brain and vessel imaging. Currently, her headache is significantly better, rated at about 2/10.  PMHx includes new graft in place pending dialysis, secondary hyperparathyroidism, DM with peripheral neuropathy, morbid obesity, HTN and HLD.     SUBJECTIVE (INTERVAL HISTORY) Her family is at bedside, she still has a headache but improving, reviewed MRi images with patient and family, likely needs TEE and  loop   OBJECTIVE Temp:  [98.4 F (36.9 C)-98.6 F (37 C)] 98.5 F (36.9 C) (12/10 1316) Pulse Rate:  [81-90] 90 (12/10 1316) Cardiac Rhythm: Normal sinus rhythm (12/10 0701) Resp:  [13-18] 13 (12/10 1316) BP: (113-169)/(61-91) 113/61 (12/10 1316) SpO2:  [93 %-100 %] 93 % (12/10 1316) Weight:  [229 lb 8 oz (104.1 kg)] 229 lb 8 oz (104.1 kg) (12/10 0600)  CBC:  Recent Labs  Lab 12/02/17 1330  WBC 8.9  NEUTROABS 6.3  HGB 10.3*  HCT 32.9*  MCV 94.0  PLT 378    Basic Metabolic Panel:  Recent Labs  Lab 12/02/17 1330  NA 136  K 3.5  CL 104  CO2 25  GLUCOSE 118*  BUN 41*  CREATININE 4.93*  CALCIUM 8.3*    Lipid Panel:     Component Value Date/Time   CHOL 249 (H) 12/03/2017 0409   CHOL 277 02/13/2013   TRIG 158 (H) 12/03/2017 0409   TRIG 200 02/13/2013   HDL 48 12/03/2017 0409   CHOLHDL 5.2 12/03/2017 0409   VLDL 32 12/03/2017 0409   LDLCALC 169 (H) 12/03/2017 0409   LDLCALC 195 02/13/2013   HgbA1c:  Lab Results  Component Value Date   HGBA1C 6.4 (H) 12/03/2017   Urine Drug Screen: No results found for: LABOPIA, COCAINSCRNUR, LABBENZ, AMPHETMU, THCU, LABBARB  Alcohol Level No results found for: ETH  IMAGING   Ct Head Wo Contrast 12/02/2017 IMPRESSION:  1. New age-indeterminate infarct within the right cerebellum, potentially subacute.  Consider MRI for further evaluation as clinically indicated.    MR  MRV MRA Head:  IMPRESSION: 1. Subacute inferior right cerebellar nonhemorrhagic infarct. 2. Periventricular and subcortical white matter disease bilaterally is moderately advanced for age. 3. White matter disease is likely secondary to distal small vessel disease. 4. MRA demonstrates no significant proximal stenosis, aneurysm, or branch vessel occlusion within the circle of Willis. 5. Major venous structures scratched at the major venous structures are patent.  Transthoracic Echocardiogram  12/03/2017 Study Conclusions  - Left ventricle:  The cavity size was normal. Wall thickness was   increased in a pattern of moderate LVH. Systolic function was   normal. The estimated ejection fraction was in the range of 60%   to 65%. Diastolic function is abnormal, indeterminant grade.   There is evidence of elevated LA pressure. Wall motion was   normal; there were no regional wall motion abnormalities. - Aortic valve: Valve area (VTI): 2.15 cm^2. Valve area (Vmax):   2.15 cm^2. Valve area (Vmean): 1.97 cm^2.    Bilateral Carotid Dopplers 12/03/2017 1-39% ICA plaquing. Vertebral artery flow is antegrade.      PHYSICAL EXAM Vitals:   12/05/17 0347 12/05/17 0600 12/05/17 0936 12/05/17 1316  BP: 132/76   113/61  Pulse: 81   90  Resp:    13  Temp: 98.6 F (37 C)   98.5 F (36.9 C)  TempSrc: Oral   Oral  SpO2: 100%  94% 93%  Weight:  229 lb 8 oz (104.1 kg)    Height:        PHYSICAL EXAM Physical exam: Exam: Gen: NAD Eyes: anicteric sclerae, moist conjunctivae                    CV: no MRG, no carotid bruits, no peripheral edema Mental Status: Alert, follows commands, good historian  Neuro: Detailed Neurologic Exam  Speech:    No aphasia, no dysarthria  Cranial Nerves:    The pupils are equal, round, and reactive to light.. Attempted, Fundi not visualized.  EOMI. No gaze preference. Visual fields full. Face symmetric, Tongue midline. Hearing intact to voice. Shoulder shrug intact  Motor Observation:    no involuntary movements noted. Tone appears normal.     Coordination: No Dysmetria on finger-to-nose ataxia  Strength:    5/5     Sensation:  Intact to LT  Plantars downgoing.      ASSESSMENT/PLAN [name] is a 46 y.o. female with history of end-stage renal disease, hyperparathyroidism, morbid obesity, neuropathy, hypertension, hyperlipidemia, migraine headaches, fibromyalgia, diabetes mellitus, and anemia of chronic disease presenting with a severe headache. She did not receive IV t-PA due  to no focal neurologic deficits and late presentation.   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  Resultant  No deficit  CT head - New age-indeterminate infarct within the right cerebellum, potentially subacute.   MRI head - Subacute inferior right cerebellar nonhemorrhagic infarct.  MRV - Unremarkable   MRA head - Unremarkable   Carotid Doppler - 1-39% ICA plaquing. Vertebral artery flow is antegrade.   2D Echo - EF 60-65%. No cardiac source of emboli identified.  LDL - 169  HgbA1c - 6.4  VTE prophylaxis - subcutaneous heparin Diet NPO time specified  No antithrombotic prior to admission, now on aspirin 325 mg daily  Patient counseled to be compliant with her antithrombotic medications  Ongoing aggressive stroke risk factor management  Therapy recommendations:  pending  Disposition:  Pending  Hypertension  Occasional high blood pressures but Stable  Permissive hypertension (  OK if < 220/120) but gradually normalize in 5-7 days  Long-term BP goal normotensive  Hyperlipidemia  Home meds:  No lipid lowering medications prior to admission  LDL 169, goal < 70  Now on Lipitor 80 mg daily  Continue statin at discharge  Diabetes  HgbA1c 6.4, goal < 7.0  Controlled  Other Stroke Risk Factors  Obesity, Body mass index is 33.89 kg/m., recommend weight loss, diet and exercise as appropriate   Family hx stroke (grandmother)   Other Active Problems  ESRD  Anemia of chronic disease  History of migraine headaches   Plan / Recommendations  TEE and Loop pending  Continue Lipitor  Continue aspirin   urine drug screen pending   I have personally examined this patient, reviewed notes, independently viewed imaging studies, participated in medical decision making and plan of care.ROS completed by me personally and pertinent positives fully documented  I have made any additions or clarifications directly to the above note. She presented  with embolic right posterior inferior cerebellar artery infarct and plan is to check TEE and loop recorder today. Agree with aspirin and Lipitor. Urine drug screen is pending. Greater than 50% time during this 25 minute visit was spent on counseling and coordination of care about an embolic stroke and has answering questions. Discussed with Dr. Madlyn Frankel, MD Medical Director [facility] Pager: [phone] 12/05/2017 1:26 PM   To contact Stroke Continuity provider, please refer to [email or web address] After hours, contact General Neurology

## 2017-12-05 NOTE — H&P (View-Only) (Signed)
ELECTROPHYSIOLOGY CONSULT NOTE  Patient ID: Heather [name] MRN: [id number], DOB/AGE: September 11, 1971   Admit date: 12/02/2017 Date of Consult: 12/05/2017  Primary Physician: Briscoe Deutscher, DO Primary Cardiologist: none Reason for Consultation: Cryptogenic stroke ; recommendations regarding Implantable Loop,  Recorder requested by D. Ahern  History of Present Illness Heather [name] was admitted on 12/02/2017 with c/o unusual headache, persistent and different from her migraines.  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  She first developed symptoms while at home.  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  The patient has been monitored on telemetry which has demonstrated sinus rhythm with no arrhythmias.  Inpatient stroke work-up is to be completed with a TEE, not yet scheduled.   Echocardiogram this admission demonstrated  12/03/2017 Study Conclusions - Left ventricle: The cavity size was normal. Wall thickness was increased in a pattern of moderate LVH. Systolic function was normal. The estimated ejection fraction was in the range of 60% to 65%. Diastolic function is abnormal, indeterminant grade. There is evidence of elevated LA pressure. Wall motion was normal; there were no regional wall motion abnormalities. - Aortic valve: Valve area (VTI): 2.15 cm^2. Valve area (Vmax): 2.15 cm^2. Valve area (Vmean): 1.97 cm^2.  Bilateral Carotid Dopplers 12/03/2017 1-39% ICA plaquing. Vertebral artery flow is antegrade   Lab work is reviewed.  Prior to admission, the patient denies chest pain, shortness of breath, dizziness, palpitations, or syncope.She feels fully recovered from their stroke  with plans to home at discharge as far as the patient has been told.     Past Medical History:  Diagnosis Date  . Allergy   . Anemia of chronic disease   . B12 deficiency 05/10/2014  . Blood transfusion without reported diagnosis   . Bronchitis   . Chronic constipation   . Chronic kidney disease   . Constipation   . Diabetic neuropathy, painful ([place]) 07/13/2013   Stop lyrica due to cost & SE of drowsy. Gabapentin helps Stay off hydrocodone: pt states she thinks "she was addicted" to med. "I don't want that anymore."   . DM2 (diabetes mellitus, type 2) ([place])   . Dyspnea    with exertion  . Fibromyalgia   . Gastroparesis due to DM   . GERD (gastroesophageal reflux disease)   . Headache   . Heart murmur, systolic 62/08/4764   Grade 1/6 ejection murmur per [facility] notes.   . Hyperlipemia   . Hypertension   . IBS (irritable bowel syndrome)   . Infertility   . Leiomyoma of uterus   . Morbid obesity due to excess calories ([place]) 11/16/2016  . Neuropathy   . Obesity   . Pancreatitis   . 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 Right-sided low back pain without sciatica 12/08/2015  . Secondary hyperthyroidism 11/27/2013   Followed by [facility], notes reviewed 01/01/15. PTH had improved with oral vitamin D: 133 to 85. Last 155-probably not taking vit D.    [name] Upper airway cough syndrome 11/15/2016   rec off acei/ on pepcid hs 11/15/2016 >>>      Surgical History:  Past Surgical History:  Procedure Laterality Date  . AV FISTULA PLACEMENT Left 05/09/2017   Procedure: INSERTION OF ARTERIOVENOUS (AV) GRAFT ARM (ARTEGRAFT);  Surgeon: Conrad Woodland, MD;  Location: [name];  Service: Vascular;  Laterality: Left;  . BASCILIC VEIN TRANSPOSITION Left 08/31/2016   Procedure: BASILIC VEIN TRANSPOSITION FIRST STAGE;  Surgeon: Conrad [place], MD;  Location: [place];  Service: Vascular;  Laterality: Left;  . FOOT SURGERY Right    t"ook bone out- maybe hammer toe"  . LIGATION  OF ARTERIOVENOUS  FISTULA Left 05/09/2017   Procedure: LIGATION OF ARTERIOVENOUS  FISTULA;  Surgeon: Conrad Millhousen, MD;  Location: [place];  Service: Vascular;  Laterality: Left;  . MYOMECTOMY    . VITRECTOMY Bilateral      Medications Prior to Admission  Medication Sig Dispense Refill Last Dose  . albuterol (PROVENTIL HFA;VENTOLIN HFA) 108 (90 Base) MCG/ACT inhaler Inhale 2 puffs into the lungs every 6 (six) hours as needed for wheezing or shortness of breath. 1 Inhaler 2 prn  . amLODipine (NORVASC) 10 MG tablet Take 10 mg by mouth daily.   12/02/2017 at Unknown time  . calcitRIOL (ROCALTROL) 0.25 MCG capsule Take 0.5 mcg by mouth daily.    12/02/2017 at Unknown time  . epoetin alfa (EPOGEN,PROCRIT) 52778 UNIT/ML injection Inject 30,000 Units into the vein every 14 (fourteen) days.    11/28/2017  . fluticasone (FLONASE) 50 MCG/ACT nasal spray Place 2 sprays daily into both nostrils. 48 g 1 12/02/2017 at Unknown time  . fluticasone (FLOVENT HFA) 220 MCG/ACT inhaler Inhale 2 puffs into the lungs daily. 1 Inhaler 12 12/02/2017 at Unknown time  . furosemide (LASIX) 80 MG tablet Take 1 tablet (80 mg total) by mouth 2 (two) times daily. (Patient taking differently: Take 160 mg by mouth 2 (two) times daily. ) 60 tablet 0 12/02/2017 at Unknown time  . gabapentin (NEURONTIN) 300 MG capsule Take 1 capsule (300 mg total) 2 (two) times daily as needed by mouth. 60 capsule 3 prn  . insulin aspart (NOVOLOG) 100 UNIT/ML FlexPen Inject under the skin 15 minutes before meals: 6 units before Breakfast, 8 units before Lunch, 8 units before Supper plus scale BG-150/25 up to 60 units daily   12/02/2017 at Unknown time  . insulin degludec (TRESIBA FLEXTOUCH) 100 UNIT/ML SOPN FlexTouch Pen Inject 28 Units into the skin daily with breakfast.    12/02/2017 at Unknown time  . ipratropium (ATROVENT) 0.03 % nasal spray PLACE 2 SPRAYS INTO BOTH NOSTRILS EVERY 12 (TWELVE) HOURS. 16 mL 2 12/02/2017 at Unknown time  . linaclotide (LINZESS)  145 MCG CAPS capsule Take 1 capsule (145 mcg total) by mouth daily before breakfast. 30 capsule 5 12/02/2017 at Unknown time  . lisinopril (PRINIVIL,ZESTRIL) 20 MG tablet Take 20 mg by mouth daily.   12/02/2017 at Unknown time  . cetirizine (ZYRTEC) 5 MG tablet 1/2 po daily prn allergic rhinitis (Patient not taking: Reported on 12/03/2017) 24 tablet 0 Not Taking at Unknown time  . Continuous Blood Gluc Sensor ([place]) MISC by Does not apply route.   Taking  . glucose blood (ONETOUCH VERIO) test strip Test 4 times daily. (Patient taking differently: 1 each by Other route 3 (three) times daily. ) 100 each 12 Taking    Inpatient Medications:  . aspirin  300 mg Rectal Daily   Or  . aspirin  325 mg Oral Daily  . atorvastatin  80 mg Oral q1800  . budesonide  0.5 mg Nebulization BID  . furosemide  60 mg Intravenous BID  . heparin  5,000 Units Subcutaneous Q8H  . insulin aspart  0-9 Units Subcutaneous  TID WC  . insulin glargine  25 Units Subcutaneous Daily    Allergies:  Allergies  Allergen Reactions  . Ibuprofen Other (See Comments)    CKD stage 3. Should avoid.  . Dilaudid [Hydromorphone Hcl] Itching and Other (See Comments)    Can take with Benadryl.    Social History   Socioeconomic History  . Marital status: Married    Spouse name: Not on file  . Number of children: 1  . Years of education: college  . Highest education level: Not on file  Social Needs  . Financial resource strain: Not on file  . Food insecurity - worry: Not on file  . Food insecurity - inability: Not on file  . Transportation needs - medical: Not on file  . Transportation needs - non-medical: Not on file  Occupational History  . Occupation: Armed forces technical officer: [employer]  Tobacco Use  . Smoking status: Never Smoker  . Smokeless tobacco: Never Used  Substance and Sexual Activity  . Alcohol use: No    Alcohol/week: 0.0 oz  . Drug use: No  . Sexual activity: Yes     Partners: Male    Birth control/protection: None  Other Topics Concern  . Not on file  Social History Narrative   Patient lives with fiance. She has 1 grown son. Works full-time, she Paramedic for attorney.   Caffeine Use: 2 cups daily; sodas occasionally     Family History  Problem Relation Age of Onset  . Colon polyps Mother   . Diabetes Mother   . Heart murmur Father   . Hypertension Father   . Diabetes Sister   . Kidney failure Sister   . Diabetes Brother   . Retinal degeneration Brother   . Heart murmur Son   . Stroke Paternal Grandmother   . Diabetes Sister       Review of Systems: All other systems reviewed and are otherwise negative except as noted above.  Physical Exam: Vitals:   12/04/17 1700 12/04/17 2132 12/05/17 0347 12/05/17 0600  BP: (!) 169/91  132/76   Pulse: 87  81   Resp: 18     Temp: 98.4 F (36.9 C)  98.6 F (37 C)   TempSrc: Oral  Oral   SpO2: 100% 97% 100%   Weight:    229 lb 8 oz (104.1 kg)  Height:        GEN- The patient is well appearing, alert and oriented x 3 today.   Head- normocephalic, atraumatic Eyes-  Sclera clear, conjunctiva pink Ears- hearing intact Oropharynx- clear Neck- supple Lungs- CTA b/l, normal work of breathing Heart- RRR, no murmurs, rubs or gallops  GI- soft, NT, ND, obese Extremities- no clubbing, cyanosis, or edema MS- no significant deformity or atrophy Skin- no rash or lesion Psych- euthymic mood, full affect   Labs:   Lab Results  Component Value Date   WBC 8.9 12/02/2017   HGB 10.3 (L) 12/02/2017   HCT 32.9 (L) 12/02/2017   MCV 94.0 12/02/2017   PLT 358 12/02/2017    Recent Labs  Lab 12/02/17 1330  NA 136  K 3.5  CL 104  CO2 25  BUN 41*  CREATININE 4.93*  CALCIUM 8.3*  GLUCOSE 118*   Lab Results  Component Value Date   CKTOTAL 63 09/20/2011   CKMB 2.0 09/20/2011   TROPONINI <0.03 02/17/2017   Lab Results  Component Value Date   CHOL 249 (H) 12/03/2017  CHOL 220 (H)  04/17/2015   CHOL 281 (H) 09/11/2014   Lab Results  Component Value Date   HDL 48 12/03/2017   HDL 31.30 (L) 04/17/2015   HDL 29.60 (L) 09/11/2014   Lab Results  Component Value Date   LDLCALC 169 (H) 12/03/2017   LDLCALC 150 (H) 04/26/2014   LDLCALC 195 02/13/2013   Lab Results  Component Value Date   TRIG 158 (H) 12/03/2017   TRIG (H) 04/17/2015    404.0 Triglyceride is over 400; calculations on Lipids are invalid.   TRIG (H) 09/11/2014    445.0 Triglyceride is over 400; calculations on Lipids are invalid.   Lab Results  Component Value Date   CHOLHDL 5.2 12/03/2017   CHOLHDL 7 04/17/2015   CHOLHDL 9 09/11/2014   Lab Results  Component Value Date   LDLDIRECT 120.0 04/17/2015   LDLDIRECT 171.4 09/11/2014    Lab Results  Component Value Date   DDIMER 1.04 (H) 12/03/2017     Radiology/Studies:  Ct Head Wo Contrast Result Date: 12/02/2017 CLINICAL DATA:  Acute onset headache.  History of migraines. EXAM: CT HEAD WITHOUT CONTRAST TECHNIQUE: Contiguous axial images were obtained from the base of the skull through the vertex without intravenous contrast. COMPARISON:  MRI brain dated April 22, 2015. CT head dated April 21, 2015. FINDINGS: Brain: New age-indeterminate infarct within the right cerebellum. No evidence of hemorrhage, hydrocephalus, extra-axial collection or mass lesion/mass effect. Vascular: Atherosclerotic vascular calcification of the carotid siphons. No hyperdense vessel. Skull: Normal. Negative for fracture or focal lesion. Sinuses/Orbits: Partial opacification of the left mastoid air cells. The orbits are unremarkable. Other: None. IMPRESSION: 1. New age-indeterminate infarct within the right cerebellum, potentially subacute. Consider MRI for further evaluation as clinically indicated. Electronically Signed   By: Titus Dubin M.D.   On: 12/02/2017 14:00    Mr Brain Wo Contrast Result Date: 12/03/2017 CLINICAL DATA:  Abnormal CT. Headaches for 2 days.  Difficulty chewing and eating. EXAM: MRI HEAD WITHOUT CONTRAST MRA HEAD WITHOUT CONTRAST MRV HEAD WITHOUT CONTRAST TECHNIQUE: Multiplanar, multiecho pulse sequences of the brain and surrounding structures were obtained without intravenous contrast. Angiographic images of the head were obtained using MRA and MRV technique without contrast. COMPARISON:  None. FINDINGS: MRI HEAD FINDINGS Brain: The diffusion-weighted images confirm an acute/subacute nonhemorrhagic infarct involving the inferior right cerebellum. No other acute infarct is present. Associated T2 signal changes are compatible with a subacute time frame. Age advanced atrophy is present. Periventricular and subcortical T2 changes bilaterally are moderately advanced for age. The internal auditory canals are normal bilaterally. White matter changes extend into the brainstem Vascular: Flow is present in the major intracranial arteries. Skull and upper cervical spine: The skullbase is normal. The craniocervical junction is within normal limits. Sinuses/Orbits: The paranasal sinuses are clear. There is fluid in left mastoid air cells. No obstructing nasopharyngeal lesion is present. Bilateral lens replacements are present. MRA HEAD FINDINGS Time-of-flight MRA head demonstrates normal signal in the internal carotid artery is from the high cervical segments through the ICA termini bilaterally. The A1 and M1 segments are normal. The anterior communicating artery is patent. MCA bifurcations are within normal limits. ACA and MCA branch vessels are unremarkable. The right vertebral artery is dominant. Right PICA is visualized but the origin is below the field of view. The left AICA is dominant. The basilar artery is normal. Both posterior cerebral artery is originate from the basilar tip. PCA branch vessels are within normal limits. MRV HEAD FINDINGS MR  venogram demonstrates normal patency of the dural sinuses. The right transverse sinus is dominant. The straight  sinus deep cerebral veins are intact. Cortical veins are unremarkable. IMPRESSION: 1. Subacute inferior right cerebellar nonhemorrhagic infarct. 2. Periventricular and subcortical white matter disease bilaterally is moderately advanced for age. 3. White matter disease is likely secondary to distal small vessel disease. 4. MRA demonstrates no significant proximal stenosis, aneurysm, or branch vessel occlusion within the circle of Willis. 5. Major venous structures scratched at the major venous structures are patent. Electronically Signed   By: San Morelle M.D.   On: 12/03/2017 21:02      12-lead ECG SR All prior EKG's in EPIC reviewed with no documented atrial fibrillation  Telemetry reviewed by Dr. Lovena Le, SR  Assessment and Plan:  1. Cryptogenic stroke The patient presents with cryptogenic stroke.  The patient is pending TEE scheduling.  Dr. Lovena Le has seen and examined the patient, he spoke at length with the patient about monitoring for afib with either a 30 day event monitor or an implantable loop recorder.  Risks, benefits, and alteratives to implantable loop recorder were discussed with the patient today.   At this time, the patient is very clear in their decision to proceed with implantable loop recorder once TEE is completed if unrevealing.   Wound care was reviewed with the patient (keep incision clean and dry for 3 days).  Wound check will be scheduled for the patient  Please call with questions.   Renee Dyane Dustman, PA-C 12/05/2017  EP attending  Patient seen and examined.  Agree with findings above.  The patient has had a cryptogenic stroke.  She will undergo TEE and if negative proceed with insertion of an implantable loop recorder.  I discussed the indications, risks, benefits, goals and expectations of the procedure and she wishes to proceed.  Crissie Sickles, MD

## 2017-12-05 NOTE — Progress Notes (Signed)
Patient given atarax and complained it increased her pruritis and was observed to scratch R smallest toe without relief.  Benadryl topical helped somewhat per pt comments.  Patient asked if her itch  (per Morphine side effect) medication PO/IV can be changed to something else without the potential s/e of increased itchiness.

## 2017-12-05 NOTE — Interval H&P Note (Signed)
History and Physical Interval Note:  12/05/2017 1:03 PM  Heather [name]  has presented today for surgery, with the diagnosis of stroke  The various methods of treatment have been discussed with the patient and family. After consideration of risks, benefits and other options for treatment, the patient has consented to  Procedure(s): TRANSESOPHAGEAL ECHOCARDIOGRAM (TEE) (N/A) as a surgical intervention .  The patient's history has been reviewed, patient examined, no change in status, stable for surgery.  I have reviewed the patient's chart and labs.  Questions were answered to the patient's satisfaction.     Sumayah Bearse

## 2017-12-05 NOTE — Interval H&P Note (Signed)
History and Physical Interval Note:  12/05/2017 3:07 PM  Heather [name]  has presented today for surgery, with the diagnosis of stroke  The various methods of treatment have been discussed with the patient and family. After consideration of risks, benefits and other options for treatment, the patient has consented to  Procedure(s): LOOP RECORDER INSERTION (N/A) as a surgical intervention .  The patient's history has been reviewed, patient examined, no change in status, stable for surgery.  I have reviewed the patient's chart and labs.  Questions were answered to the patient's satisfaction.     Cristopher Peru

## 2017-12-05 NOTE — Discharge Instructions (Signed)
Heart monitor, wound care/site instructions: Keep incision clean and dry for 3 days. No driving for 2 days. You can remove outer dressing tomorrow. Leave steri-strips (little pieces of tape) on until seen in the office for wound check appointment. Call the office [phone]) for redness, drainage, swelling, or fever.

## 2017-12-05 NOTE — Progress Notes (Signed)
Physical Therapy Treatment Patient Details Name: Heather [name] MRN: [id number] DOB: [birth year] Today's Date: 12/05/2017    History of Present Illness 46 y.o. 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 At the time of headache, patient also c/o difficulty chewing a french fry. CT on 12/7 revealed "age-indeterminate infarct within the right cerebellum,     PT Comments    Patient progressing well towards PT goals. Reports headache in beginning of session and was given morphine. Balance seems improved with use of RW for support. Pt with + nausea and emesis post ambulation. RN notified. Pt feels more secure using RW for ambulation- will try rollator next session and compare the two. Pt to go down for TEE today. Will follow.    Follow Up Recommendations  Home health PT;Supervision/Assistance - 24 hour     Equipment Recommendations  Rolling walker with 5" wheels;3in1 (PT)    Recommendations for Other Services       Precautions / Restrictions Precautions Precautions: Fall Precaution Comments: very unsteady, h/o falls Restrictions Weight Bearing Restrictions: No    Mobility  Bed Mobility Overal bed mobility: Modified Independent             General bed mobility comments: Using bed rail. HOB up. No assist needed to get into/out of bed.   Transfers Overall transfer level: Needs assistance Equipment used: Rolling walker (2 wheeled) Transfers: Sit to/from Stand Sit to Stand: Min guard         General transfer comment: Min guard for safety. Cues for hand placement to push off from bed instead of pull up on RW. Stood from [employer], from toilet x1.   Ambulation/Gait Ambulation/Gait assistance: Min guard Ambulation Distance (Feet): 80 Feet Assistive device: Rolling walker (2 wheeled) Gait Pattern/deviations: Step-through pattern;Decreased step length - right;Decreased  step length - left;Decreased stride length;Drifts right/left;Decreased dorsiflexion - right Gait velocity: decreased   General Gait Details: Slow, guarded gait using RW; decreased foot clearance RLE. 1 standing rest break. + nausea post session and + emesis.    Stairs            Wheelchair Mobility    Modified Rankin (Stroke Patients Only) Modified Rankin (Stroke Patients Only) Pre-Morbid Rankin Score: Moderate disability Modified Rankin: Moderately severe disability     Balance Overall balance assessment: Needs assistance;History of Falls Sitting-balance support: No upper extremity supported;Feet supported Sitting balance-Leahy Scale: Good Sitting balance - Comments: Able to donn socks independently without assist.    Standing balance support: During functional activity Standing balance-Leahy Scale: Fair Standing balance comment: Able to wash hands at sink without UE support, prefers UE support for gait due to unsteadiness.                             Cognition Arousal/Alertness: Awake/alert Behavior During Therapy: WFL for tasks assessed/performed Overall Cognitive Status: Within Functional Limits for tasks assessed                                        Exercises      General Comments General comments (skin integrity, edema, etc.): Pt with + emesis post ambulation. RN notified.       Pertinent Vitals/Pain Pain Assessment: Faces Faces Pain Scale: Hurts whole lot Pain Location:  head Pain Descriptors / Indicators: Headache Pain Intervention(s): Monitored during session;Repositioned;RN gave pain meds during session;Limited activity within patient's tolerance    Home Living                      Prior Function            PT Goals (current goals can now be found in the care plan section) Progress towards PT goals: Progressing toward goals    Frequency    Min 4X/week      PT Plan Current plan remains appropriate     Co-evaluation              AM-PAC PT "6 Clicks" Daily Activity  Outcome Measure  Difficulty turning over in bed (including adjusting bedclothes, sheets and blankets)?: None Difficulty moving from lying on back to sitting on the side of the bed? : None Difficulty sitting down on and standing up from a chair with arms (e.g., wheelchair, bedside commode, etc,.)?: None Help needed moving to and from a bed to chair (including a wheelchair)?: A Little Help needed walking in hospital room?: A Little Help needed climbing 3-5 steps with a railing? : A Lot 6 Click Score: 20    End of Session Equipment Utilized During Treatment: Gait belt Activity Tolerance: Patient tolerated treatment well;Treatment limited secondary to medical complications (Comment)(nausea and vomiting) Patient left: in bed;with call bell/phone within reach;with family/visitor present Nurse Communication: Mobility status;Other (comment)(vomiting) PT Visit Diagnosis: Unsteadiness on feet (R26.81);Other abnormalities of gait and mobility (R26.89);Repeated falls (R29.6);Muscle weakness (generalized) (M62.81);Pain     Time: 1017-5102 PT Time Calculation (min) (ACUTE ONLY): 18 min  Charges:  $Gait Training: 8-22 mins                    G Codes:       Wray Kearns, PT, DPT [phone]     [facility] 12/05/2017, 1:01 PM

## 2017-12-05 NOTE — Op Note (Signed)
INDICATIONS: cryptogenic stroke  PROCEDURE:   Informed consent was obtained prior to the procedure. The risks, benefits and alternatives for the procedure were discussed and the patient comprehended these risks.  Risks include, but are not limited to, cough, sore throat, vomiting, nausea, somnolence, esophageal and stomach trauma or perforation, bleeding, low blood pressure, aspiration, pneumonia, infection, trauma to the teeth and death.    After a procedural time-out, the oropharynx was anesthetized with 20% benzocaine spray.   During this procedure the patient was administered a total of Versed 4 mg and Fentanyl 50 mcg to achieve and maintain moderate conscious sedation.  The patient's heart rate, blood pressure, and oxygen saturationweare monitored continuously during the procedure. The period of conscious sedation was 9 minutes, of which I was present face-to-face 100% of this time.  The transesophageal probe was inserted in the esophagus and stomach without difficulty and multiple views were obtained.  The patient was kept under observation until the patient left the procedure room.  The patient left the procedure room in stable condition.   Agitated microbubble saline contrast was administered.  COMPLICATIONS:    There were no immediate complications.  FINDINGS:  Normal TEE. No cardiac source of embolism. Mild plaque in the aortic arch.  RECOMMENDATIONS:     Implantable loop recorder.  Time Spent Directly with the Patient:  30 minutes   Cullen Lahaie 12/05/2017, 2:09 PM

## 2017-12-06 LAB — URINALYSIS, ROUTINE W REFLEX MICROSCOPIC
Bilirubin Urine: NEGATIVE
GLUCOSE, UA: NEGATIVE mg/dL
Ketones, ur: NEGATIVE mg/dL
Nitrite: NEGATIVE
PROTEIN: 100 mg/dL — AB
SPECIFIC GRAVITY, URINE: 1.011 (ref 1.005–1.030)
pH: 6 (ref 5.0–8.0)

## 2017-12-06 LAB — GLUCOSE, CAPILLARY
GLUCOSE-CAPILLARY: 134 mg/dL — AB (ref 65–99)
GLUCOSE-CAPILLARY: 189 mg/dL — AB (ref 65–99)
GLUCOSE-CAPILLARY: 179 mg/dL — AB (ref 65–99)

## 2017-12-06 MED ORDER — INSULIN DEGLUDEC 100 UNIT/ML ~~LOC~~ SOPN
28.0000 [IU] | PEN_INJECTOR | Freq: Every day | SUBCUTANEOUS | Status: DC
  Filled 2017-12-06: qty 3

## 2017-12-06 MED ORDER — FUROSEMIDE 80 MG PO TABS
160.0000 mg | ORAL_TABLET | Freq: Two times a day (BID) | ORAL | Status: DC
  Administered 2017-12-06: 160 mg via ORAL
  Filled 2017-12-06: qty 2

## 2017-12-06 MED ORDER — ALBUTEROL SULFATE (2.5 MG/3ML) 0.083% IN NEBU: 3.0000 mL | INHALATION_SOLUTION | Freq: Four times a day (QID) | RESPIRATORY_TRACT | Status: DC | PRN

## 2017-12-06 MED ORDER — GABAPENTIN 300 MG PO CAPS: 300.0000 mg | ORAL_CAPSULE | Freq: Two times a day (BID) | ORAL | Status: DC | PRN

## 2017-12-06 MED ORDER — LINACLOTIDE 145 MCG PO CAPS
145.0000 ug | ORAL_CAPSULE | Freq: Every day | ORAL | Status: DC
  Filled 2017-12-06: qty 1

## 2017-12-06 MED ORDER — LISINOPRIL 20 MG PO TABS
20.0000 mg | ORAL_TABLET | Freq: Every day | ORAL | Status: DC
  Administered 2017-12-06: 20 mg via ORAL
  Filled 2017-12-06: qty 1

## 2017-12-06 MED ORDER — ONDANSETRON HCL 4 MG/2ML IJ SOLN: 4.0000 mg | Freq: Four times a day (QID) | INTRAMUSCULAR | Status: DC | PRN

## 2017-12-06 MED ORDER — ACETAMINOPHEN 325 MG PO TABS
325.0000 mg | ORAL_TABLET | ORAL | Status: DC | PRN
Start: 2017-12-06 — End: 2017-12-06
  Administered 2017-12-06: 650 mg via ORAL
  Filled 2017-12-06: qty 2

## 2017-12-06 MED ORDER — AMLODIPINE BESYLATE 10 MG PO TABS
10.0000 mg | ORAL_TABLET | Freq: Every day | ORAL | Status: DC
  Administered 2017-12-06: 10 mg via ORAL
  Filled 2017-12-06: qty 1

## 2017-12-06 MED ORDER — IPRATROPIUM BROMIDE 0.06 % NA SOLN
2.0000 | Freq: Two times a day (BID) | NASAL | Status: DC
  Filled 2017-12-06: qty 15

## 2017-12-06 MED ORDER — IPRATROPIUM BROMIDE 0.03 % NA SOLN
2.0000 | Freq: Two times a day (BID) | NASAL | Status: DC
  Filled 2017-12-06: qty 30

## 2017-12-06 MED ORDER — CALCITRIOL 0.5 MCG PO CAPS
0.5000 ug | ORAL_CAPSULE | Freq: Every day | ORAL | Status: DC
  Administered 2017-12-06: 0.5 ug via ORAL
  Filled 2017-12-06: qty 1

## 2017-12-06 MED ORDER — FLUTICASONE PROPIONATE 50 MCG/ACT NA SUSP
2.0000 | Freq: Every day | NASAL | Status: DC
  Administered 2017-12-06: 2 via NASAL
  Filled 2017-12-06: qty 16

## 2017-12-06 NOTE — Progress Notes (Signed)
PROGRESS NOTE    Heather [name]  CBJ:[id number] DOB: 08-24-1971 DOA: 12/02/2017 PCP: Briscoe Deutscher, DO  Brief Narrative:   46 y.o.femalewith medical history significant forchronic kidney disease stage V not yet on dialysis, insulin-dependent diabetes mellitus, hypertension, and anemia, now presenting to the emergency department for evaluation of a severe headache. Patient reports that she had experienced a similar severe headache 2 days earlier that resolved spontaneously after about a half an hour. She had a recurrence in the headache today, did not improve as quickly as the prior one did, and so she came into the ED for evaluation.Also reports some difficulty with chewing a french fry after the headache today. Denies choking.Denies fevers or chills, denies recent trauma, and denies focal numbness or weakness.  [facility]Upon arrival to the ED, patient is found to be chemistry panel is notable for a afebrile, saturating well on room air, initially hypertensive to 213/116, and with vitals otherwise stable. BUN of 41 and creatinine of 4.93. CBC features a stable normocytic anemia with hemoglobin of 10.3. Noncontrast head CT is notable for a new age-indeterminate infarct within the right cerebellum, possibly subacute. Neurology was consulted by the ED physician, recommended medical admission to [facility] was CTA head and neck MRI brain. Blood pressure trended down spontaneously, patient remained hemodynamically stable, and will be admitted to the telemetry unit for ongoing evaluation and headache and dysphagia with CT evidence for ischemic CVA, likely subacute.       Assessment & Plan:   Principal Problem:   Stroke (cerebrum) ([place]) Active Problems:   Diabetes mellitus, type II, insulin dependent (HCC)   Essential hypertension   Anemia in chronic kidney disease   Chronic kidney disease, stage V (HCC)   Anemia of chronic disease  Hypertensive urgency   Acute intractable headache  -Right cerebellar subacute infarct-MRA and MRV unremarkable.  MRI of the head shows subacute right cerebellar infarct no hemorrhage noted carotid Doppler shows no flow limitation. echo  shows EF 60-65% no cardiac emboli noted.TEE NO EMBOLI.LOOP RECORDER TO BE PLACED TODAY. -ckd 5 htn Dm hyperlipidemia     DVT prophylaxis: HEPARIN Code Status FULL :Family Communication DW HUSBAND : Disposition Plan: TBD Consultants: NEURO,CARDIO Procedures:NONE Antimicrobials: NONE Subjective: COMPLAINS OF HEADACHE  Objective:RESTING IN BED IN NAD Vitals:   12/06/17 0453 12/06/17 0806 12/06/17 0850 12/06/17 1308  BP: 129/69  99/71 (!) 128/53  Pulse: 85  87   Resp: 18  18   Temp: 98.1 F (36.7 C)  98.3 F (36.8 C)   TempSrc: Oral  Oral   SpO2: 98% 95% 95%   Weight: 106.4 kg (234 lb 9.1 oz)     Height:        Intake/Output Summary (Last 24 hours) at 12/06/2017 1317 Last data filed at 12/06/2017 0900 Gross per 24 hour  Intake -  Output 1500 ml  Net -1500 ml   Filed Weights   12/04/17 0500 12/05/17 0600 12/06/17 0453  Weight: 105.2 kg (231 lb 14.8 oz) 104.1 kg (229 lb 8 oz) 106.4 kg (234 lb 9.1 oz)    Examination:  General exam: Appears calm and comfortable  Respiratory system: Clear to auscultation. Respiratory effort normal. Cardiovascular system: S1 & S2 heard, RRR. No JVD, murmurs, rubs, gallops or clicks. No pedal edema. Gastrointestinal system: Abdomen is nondistended, soft and nontender. No organomegaly or masses felt. Normal bowel sounds heard. Central nervous system: Alert and oriented. No focal neurological deficits. Extremities: Symmetric 5 x 5 power.  Skin: No rashes, lesions or ulcers Psychiatry: Judgement and insight appear normal. Mood & affect appropriate.     Data Reviewed: I have personally reviewed following labs and imaging studies  CBC: Recent Labs  Lab 12/02/17 1330  WBC 8.9  NEUTROABS 6.3  HGB  10.3*  HCT 32.9*  MCV 94.0  PLT 035   Basic Metabolic Panel: Recent Labs  Lab 12/02/17 1330  NA 136  K 3.5  CL 104  CO2 25  GLUCOSE 118*  BUN 41*  CREATININE 4.93*  CALCIUM 8.3*   GFR: Estimated Creatinine Clearance: 18.1 mL/min (A) (by C-G formula based on SCr of 4.93 mg/dL (H)). Liver Function Tests: No results for input(s): AST, ALT, ALKPHOS, BILITOT, PROT, ALBUMIN in the last 168 hours. No results for input(s): LIPASE, AMYLASE in the last 168 hours. No results for input(s): AMMONIA in the last 168 hours. Coagulation Profile: No results for input(s): INR, PROTIME in the last 168 hours. Cardiac Enzymes: No results for input(s): CKTOTAL, CKMB, CKMBINDEX, TROPONINI in the last 168 hours. BNP (last 3 results) No results for input(s): PROBNP in the last 8760 hours. HbA1C: No results for input(s): HGBA1C in the last 72 hours. CBG: Recent Labs  Lab 12/05/17 1119 12/05/17 1635 12/05/17 2142 12/06/17 0613 12/06/17 1130  GLUCAP 95 119* 155* 134* 189*   Lipid Profile: No results for input(s): CHOL, HDL, LDLCALC, TRIG, CHOLHDL, LDLDIRECT in the last 72 hours. Thyroid Function Tests: No results for input(s): TSH, T4TOTAL, FREET4, T3FREE, THYROIDAB in the last 72 hours. Anemia Panel: No results for input(s): VITAMINB12, FOLATE, FERRITIN, TIBC, IRON, RETICCTPCT in the last 72 hours. Sepsis Labs: No results for input(s): PROCALCITON, LATICACIDVEN in the last 168 hours.  No results found for this or any previous visit (from the past 240 hour(s)).       Radiology Studies: No results found.      Scheduled Meds: . amLODipine  10 mg Oral Daily  . calcitRIOL  0.5 mcg Oral Daily  . fluticasone  2 spray Each Nare Daily  . furosemide  160 mg Oral BID  . [START ON 12/07/2017] insulin degludec  28 Units Subcutaneous Q breakfast  . ipratropium  2 spray Each Nare Q12H  . [START ON 12/07/2017] linaclotide  145 mcg Oral QAC breakfast  . lisinopril  20 mg Oral Daily    Continuous Infusions:   LOS: 4 days        Georgette Shell, MD [facility]  If 7PM-7AM, please contact night-coverage [email or web address] Password [email or web address] 12/06/2017, 1:17 PM

## 2017-12-06 NOTE — Progress Notes (Signed)
Discharge information given to patient and family. Pt verbalized understanding and teach back. Pt verbalized understanding of discharge instructions and education. Pt will follow up accordingly. No new questions and concerns. Pt stable, calm.

## 2017-12-06 NOTE — Care Management Note (Signed)
Case Management Note  Patient Details  Name: AIYANNAH FAYAD MRN: [id number] Date of Birth: 11/27/1971  Subjective/Objective:                    Action/Plan: Pt discharging home with CM consult for outpatient therapy. CM met with the patient and her spouse and she would like to attend [employer]. Orders in Epic and information on the AVS.  Pt with orders for rollator and 3 in 1. CM called [facility] through her [employer] and they arranged the DME through [facility].  CM spoke to [facility] and they state it will be a 4 hours turn around time. Pt asked that equipment be delivered to her mothers home [place] in [place]. Information given to [facility].  [facility] to inform pt of delivery time.  Pts husband to provide transportation.   Expected Discharge Date:  12/06/17               Expected Discharge Plan:  OP Rehab  In-House Referral:     Discharge planning Services  CM Consult  Post Acute Care Choice:  Durable Medical Equipment Choice offered to:  Patient  DME Arranged:  3-N-1, Walker rolling with seat DME Agency:  [facility])  HH Arranged:    HH Agency:     Status of Service:  Completed, signed off  If discussed at [employer] of Stay Meetings, dates discussed:    Additional Comments:  Pollie Friar, RN 12/06/2017, 3:46 PM

## 2017-12-06 NOTE — Plan of Care (Signed)
  Progressing Coping: Level of anxiety will decrease 12/06/2017 0230 - Progressing by Anson Fret, RN Note No anxiety noted. Pain Managment: General experience of comfort will improve 12/06/2017 0230 - Progressing by Anson Fret, RN Note No c/o pain

## 2017-12-06 NOTE — Progress Notes (Signed)
STROKE TEAM PROGRESS NOTE   HISTORY OF PRESENT ILLNESS (per record) Heather [name] is an 46 y.o. female with stage V ESRD and a remote history of migraines who presents with severe unilateral throbbing headache. She states that the headache does not feel like her usual migraine. Headache began suddenly on Wednesday evening with 10/10 throbbing pain to the left side of her head. The pain was so severe that she could not get out of bed to call her husband. She denies having had any associated weakness, confusion or incoordination. Also without neck pain. She states that the headache did not feel like one of the migraines she had had in her remote past, despite its throbbing quality and unilaterality. She had no associated N/V, no photophobia, sonophobia or osmophobia and no neck stiffness. Also without fever or chills. She slept through the pain and by Thursday morning, the pain had improved to about 5/10 and she was able to go to work. Thursday evening, she went to bed, still with the headache.   On Friday morning, she awoke without a headache. However, later on she had sudden recurrence of the headache, which was again severe, so she went to the ED at [facility] for further evaluation. Upon presentation blood pressure was noted to be 200/114 and has since then came down to 177/103. Evaluation revealed no neurological deficits. 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 She was then transported to [facility] for further assessment, including MRI brain and vessel imaging. Currently, her headache is significantly better, rated at about 2/10.  PMHx includes new graft in place pending dialysis, secondary hyperparathyroidism, DM with peripheral neuropathy, morbid obesity, HTN and HLD.     SUBJECTIVE (INTERVAL HISTORY) Her  husband is at bedside, she states her headache  improved TE was normal and she has loop placed E w Recent Results (from the past  43800 hour(s))  ECHO TEE   Collection Time: 12/05/17  2:23 PM   Narrative                              *[facility]*                         [address], [place] [address]                            [phone]  ------------------------------------------------------------------- Transesophageal Echocardiography  Patient:    Heather, [name] MR #:       [id number] Study Date: 12/05/2017 Gender:     F Age:        74 Height: Weight: BSA: Pt. Status: Room:   SONOGRAPHER  Dustin Flock, RCS  READING      Sanda Klein, MD  Annetta Maw, [place] [address] [place] [id number]  ADMITTING    Opyd, Ramireno G  cc:  ------------------------------------------------------------------- LV EF: 60% -   65%  ------------------------------------------------------------------- Study Conclusions  - Left ventricle: The cavity size  was normal. There was mild   concentric hypertrophy. Systolic function was normal. The   estimated ejection fraction was in the range of 60% to 65%. Wall   motion was normal; there were no regional wall motion   abnormalities. - Aortic valve: No evidence of vegetation. - Mitral valve: No evidence of vegetation. - Left atrium: No evidence of thrombus in the atrial cavity or   appendage. No spontaneous echo contrast was observed. - Right atrium: No evidence of thrombus in the atrial cavity or   appendage. - Atrial septum: No defect or patent foramen ovale was identified.   Echo contrast study showed no right-to-left atrial level shunt,   following an increase in RA pressure induced by provocative   maneuvers. - Tricuspid valve: No evidence of vegetation. - Pulmonic valve: No evidence of vegetation.  ------------------------------------------------------------------- Study data:   Procedure:  Initial setup. The patient was  brought to the laboratory. Surface ECG leads were monitored. Sedation. Conscious sedation was administered. Transesophageal echocardiography. Topical anesthesia was obtained using viscous lidocaine. A transesophageal probe was inserted by the attending cardiologist. Image quality was adequate.  Study completion:  The patient tolerated the procedure well. There were no complications.         Diagnostic transesophageal echocardiography.  2D and color Doppler.  Birthdate:  Patient birthdate: 09/01/1971.  Age:  Patient is 46 yr old.  Sex:  Gender: female.  Study date:  Study date: 12/05/2017. Study time: 01:57 PM.  -------------------------------------------------------------------  ------------------------------------------------------------------- Left ventricle:  The cavity size was normal. There was mild concentric hypertrophy. Systolic function was normal. The estimated ejection fraction was in the range of 60% to 65%. Wall motion was normal; there were no regional wall motion abnormalities.  ------------------------------------------------------------------- Aortic valve:   Structurally normal valve. Trileaflet. Cusp separation was normal.  No evidence of vegetation.  Doppler:  There was no regurgitation.  ------------------------------------------------------------------- Aorta:  The aorta was normal, not dilated, and non-diseased.  ------------------------------------------------------------------- Mitral valve:   Structurally normal valve.   Leaflet separation was normal.  No evidence of vegetation.  Doppler:  There was no regurgitation.  ------------------------------------------------------------------- Left atrium:  The atrium was normal in size.  No evidence of thrombus in the atrial cavity or appendage. No spontaneous echo contrast was observed.  Emptying velocity was normal.  ------------------------------------------------------------------- Atrial septum:  No  defect or patent foramen ovale was identified. Echo contrast study showed no right-to-left atrial level shunt, following an increase in RA pressure induced by provocative maneuvers.  ------------------------------------------------------------------- Pulmonic valve:    Structurally normal valve.   Cusp separation was normal.  No evidence of vegetation.  ------------------------------------------------------------------- Tricuspid valve:   Structurally normal valve.   Leaflet separation was normal.  No evidence of vegetation.  Doppler:  There was no regurgitation.  ------------------------------------------------------------------- Right atrium:  The atrium was normal in size.  No evidence of thrombus in the atrial cavity or appendage.  ------------------------------------------------------------------- Pericardium:  There was no pericardial effusion.   ------------------------------------------------------------------- Post procedure conclusions Ascending Aorta:  - The aorta was normal, not dilated, and non-diseased.  ------------------------------------------------------------------- Prepared and Electronically Authenticated by  Sanda Klein, MD 2018-12-10T15:10:28  ECHOCARDIOGRAM COMPLETE   Collection Time: 12/03/17  9:04 AM  Result Value   Weight 3,774.28   Height 69   BP 151/77   Narrative                              *[facility]*                   *  [address] [place], [place] [address]                            [phone]  ------------------------------------------------------------------- Transthoracic Echocardiography  Patient:    Sher, Shampine MR #:       [id number] Study Date: 12/03/2017 Gender:     F Age:        67 Height:     175.3 cm Weight:     107 kg BSA:        2.32 m^2 Pt. Status: Room:       [id number]   SONOGRAPHER  Johny Chess, RDCS, CCT  PERFORMING    [facility], Inpatient  ADMITTING    Opyd, Ilene Qua  ORDERING     Opyd, Timothy S  ATTENDING    Georgette Shell  cc:  ------------------------------------------------------------------- LV EF: 60% -   65%  ------------------------------------------------------------------- Indications:      CVA 19.  ------------------------------------------------------------------- History:   Risk factors:  Chronic kidney disease. Hypertension. Diabetes mellitus.  ------------------------------------------------------------------- Study Conclusions  - Left ventricle: The cavity size was normal. Wall thickness was   increased in a pattern of moderate LVH. Systolic function was   normal. The estimated ejection fraction was in the range of 60%   to 65%. Diastolic function is abnormal, indeterminant grade.   There is evidence of elevated LA pressure. Wall motion was   normal; there were no regional wall motion abnormalities. - Aortic valve: Valve area (VTI): 2.15 cm^2. Valve area (Vmax):   2.15 cm^2. Valve area (Vmean): 1.97 cm^2.  ------------------------------------------------------------------- Study data:  Comparison was made to the study of 04/21/2014.  Study status:  Routine.  Procedure:  The patient reported no pain pre or post test. Transthoracic echocardiography. Image quality was good. Study completion:  There were no complications. Transthoracic echocardiography.  M-mode, complete 2D, spectral Doppler, and color Doppler.  Birthdate:  Patient birthdate: March 15, 1971.  Age:  Patient is 46 yr old.  Sex:  Gender: female. BMI: 34.8 kg/m^2.  Blood pressure:     151/77  Patient status: Inpatient.  Study date:  Study date: 12/03/2017. Study time: 08:33 AM.  Location:  Echo laboratory.  -------------------------------------------------------------------  ------------------------------------------------------------------- Left ventricle:  The cavity size was normal. Wall thickness  was increased in a pattern of moderate LVH. Systolic function was normal. The estimated ejection fraction was in the range of 60% to 65%. Diastolic function is abnormal, indeterminant grade. There is evidence of elevated LA pressure. Wall motion was normal; there were no regional wall motion abnormalities.  ------------------------------------------------------------------- Aortic valve:   Trileaflet; normal thickness leaflets.  Doppler: There was no stenosis.   There was no significant regurgitation. VTI ratio of LVOT to aortic valve: 0.76. Valve area (VTI): 2.15 cm^2. Indexed valve area (VTI): 0.93 cm^2/m^2. Peak velocity ratio of LVOT to aortic valve: 0.76. Valve area (Vmax): 2.15 cm^2. Indexed valve area (Vmax): 0.93 cm^2/m^2. Mean velocity ratio of LVOT to aortic valve: 0.69. Valve area (Vmean): 1.97 cm^2. Indexed valve area (Vmean): 0.85 cm^2/m^2.    Mean gradient (S): 4 mm Hg. Peak gradient (S): 8 mm Hg.  ------------------------------------------------------------------- Aorta:  Aortic root: The aortic root  was normal in size.  ------------------------------------------------------------------- Mitral valve:   Normal thickness leaflets .  Doppler:   There was no evidence for stenosis.   There was no significant regurgitation.    Peak gradient (D): 4 mm Hg.  ------------------------------------------------------------------- Left atrium:  The atrium was normal in size.  ------------------------------------------------------------------- Atrial septum:  Poorly visualized.  ------------------------------------------------------------------- Right ventricle:  The cavity size was normal. Wall thickness was normal. Systolic function was normal.  ------------------------------------------------------------------- Pulmonic valve:   Not well visualized.  Doppler:   There was no evidence for stenosis.   There was no significant regurgitation.    ------------------------------------------------------------------- Tricuspid valve:   Normal thickness leaflets.  Doppler:   There was no evidence for stenosis.   There was no significant regurgitation.   ------------------------------------------------------------------- Pulmonary artery:    Systolic pressure could not be accurately estimated.   Inadequate TR jet.  ------------------------------------------------------------------- Right atrium:  The atrium was normal in size.  ------------------------------------------------------------------- Pericardium:  Mild circumferential pericardial effusion. There is no evidence of tamponade physiology by echo.  ------------------------------------------------------------------- Systemic veins: Inferior vena cava: The vessel was normal in size. The respirophasic diameter changes were in the normal range (>= 50%), consistent with normal central venous pressure.  ------------------------------------------------------------------- Measurements   Left ventricle                          Value           Reference  LV ID, ED, PLAX chordal                 43.3   mm       43 - 52  LV ID, ES, PLAX chordal                 25.9   mm       23 - 38  LV fx shortening, PLAX chordal          40     %        >=29  LV PW thickness, ED                     12.1   mm       ----------  IVS/LV PW ratio, ED                     1.11            <=1.3  Stroke volume, 2D                       64     ml       ----------  Stroke volume/bsa, 2D                   28     ml/m^2   ----------  LV e&', lateral                          6.47   cm/s     ----------  LV E/e&', lateral                        15.3            ----------  LV e&', medial  5.74   cm/s     ----------  LV E/e&', medial                         17.25           ----------  LV e&', average                          6.11   cm/s     ----------  LV E/e&', average                         16.22           ----------  LV ejection time                        300    ms       ----------    Ventricular septum                      Value           Reference  IVS thickness, ED                       13.4   mm       ----------    LVOT                                    Value           Reference  LVOT ID, S                              19     mm       ----------  LVOT area                               2.84   cm^2     ----------  LVOT peak velocity, S                   105    cm/s     ----------  LVOT mean velocity, S                   68.7   cm/s     ----------  LVOT VTI, S                             22.5   cm       ----------    Aortic valve                            Value           Reference  Aortic valve peak velocity, S           138.08 cm/s     ----------  Aortic valve mean velocity, S           98.97  cm/s     ----------  Aortic valve VTI, S  29.62  cm       ----------  Aortic mean gradient, S                 4      mm Hg    ----------  Aortic peak gradient, S                 8      mm Hg    ----------  VTI ratio, LVOT/AV                      0.76            ----------  Aortic valve area, VTI                  2.15   cm^2     ----------  Aortic valve area/bsa, VTI              0.93   cm^2/m^2 ----------  Velocity ratio, peak, LVOT/AV           0.76            ----------  Aortic valve area, peak                 2.15   cm^2     ----------  velocity  Aortic valve area/bsa, peak             0.93   cm^2/m^2 ----------  velocity  Velocity ratio, mean, LVOT/AV           0.69            ----------  Aortic valve area, mean                 1.97   cm^2     ----------  velocity  Aortic valve area/bsa, mean             0.85   cm^2/m^2 ----------  velocity    Aorta                                   Value           Reference  Aortic root ID, ED                      29     mm       ----------    Left atrium                             Value           Reference   LA ID, A-P, ES                          40     mm       ----------  LA ID/bsa, A-P                          1.72   cm/m^2   <=2.2  LA volume, S                            59.2   ml       ----------  LA volume/bsa, S  25.5   ml/m^2   ----------  LA volume, ES, 1-p A4C                  65     ml       ----------  LA volume/bsa, ES, 1-p A4C              28     ml/m^2   ----------  LA volume, ES, 1-p A2C                  51.3   ml       ----------  LA volume/bsa, ES, 1-p A2C              22.1   ml/m^2   ----------    Mitral valve                            Value           Reference  Mitral E-wave peak velocity             99     cm/s     ----------  Mitral A-wave peak velocity             100    cm/s     ----------  Mitral deceleration time                180    ms       150 - 230  Mitral peak gradient, D                 4      mm Hg    ----------  Mitral E/A ratio, peak                  1               ----------    Right atrium                            Value           Reference  RA ID, S-I, ES, A4C                     38.3   mm       34 - 49  RA area, ES, A4C                (L)     8.17   cm^2     8.3 - 19.5  RA volume, ES, A/L                      15.6   ml       ----------  RA volume/bsa, ES, A/L                  6.7    ml/m^2   ----------    Systemic veins                          Value           Reference  Estimated CVP                           3      mm Hg    ----------  Right ventricle                         Value           Reference  TAPSE                                   23.5   mm       ----------  RV s&', lateral, S                       17.1   cm/s     ----------  Legend: (L)  and  (H)  mark values outside specified reference range.  ------------------------------------------------------------------- Prepared and Electronically Authenticated by  Kerry Hough, M.D. 2018-12-08T12:25:03   *Note: Due to a large number of results and/or  encounters for the requested time period, some results have not been displayed. A complete set of results can be found in Results Review.    OBJECTIVE Temp:  [97.8 F (36.6 C)-98.4 F (36.9 C)] 98.3 F (36.8 C) (12/11 1308) Pulse Rate:  [77-91] 82 (12/11 1308) Cardiac Rhythm: Normal sinus rhythm (12/11 0723) Resp:  [10-26] 14 (12/11 1308) BP: (99-174)/(42-71) 128/53 (12/11 1308) SpO2:  [91 %-100 %] 97 % (12/11 1308) Weight:  [234 lb 9.1 oz (106.4 kg)] 234 lb 9.1 oz (106.4 kg) (12/11 0453)  CBC:  Recent Labs  Lab 12/02/17 1330  WBC 8.9  NEUTROABS 6.3  HGB 10.3*  HCT 32.9*  MCV 94.0  PLT 564    Basic Metabolic Panel:  Recent Labs  Lab 12/02/17 1330  NA 136  K 3.5  CL 104  CO2 25  GLUCOSE 118*  BUN 41*  CREATININE 4.93*  CALCIUM 8.3*    Lipid Panel:     Component Value Date/Time   CHOL 249 (H) 12/03/2017 0409   CHOL 277 02/13/2013   TRIG 158 (H) 12/03/2017 0409   TRIG 200 02/13/2013   HDL 48 12/03/2017 0409   CHOLHDL 5.2 12/03/2017 0409   VLDL 32 12/03/2017 0409   LDLCALC 169 (H) 12/03/2017 0409   LDLCALC 195 02/13/2013   HgbA1c:  Lab Results  Component Value Date   HGBA1C 6.4 (H) 12/03/2017   Urine Drug Screen: No results found for: LABOPIA, COCAINSCRNUR, LABBENZ, AMPHETMU, THCU, LABBARB  Alcohol Level No results found for: ETH  IMAGING   Ct Head Wo Contrast 12/02/2017 IMPRESSION:  1. New age-indeterminate infarct within the right cerebellum, potentially subacute.  Consider MRI for further evaluation as clinically indicated.    MR MRV MRA Head:  IMPRESSION: 1. Subacute inferior right cerebellar nonhemorrhagic infarct. 2. Periventricular and subcortical white matter disease bilaterally is moderately advanced for age. 3. White matter disease is likely secondary to distal small vessel disease. 4. MRA demonstrates no significant proximal stenosis, aneurysm, or branch vessel occlusion within the circle of Willis. 5. Major venous structures  scratched at the major venous structures are patent.  Transthoracic Echocardiogram  12/03/2017 Study Conclusions  - Left ventricle: The cavity size was normal. Wall thickness was   increased in a pattern of moderate LVH. Systolic function was   normal. The estimated ejection fraction was in the range of 60%   to 65%. Diastolic function is abnormal, indeterminant grade.   There is evidence of elevated LA pressure. Wall motion was   normal; there were no regional wall motion abnormalities. - Aortic valve:  Valve area (VTI): 2.15 cm^2. Valve area (Vmax):   2.15 cm^2. Valve area (Vmean): 1.97 cm^2.    Bilateral Carotid Dopplers 12/03/2017 1-39% ICA plaquing. Vertebral artery flow is antegrade.      PHYSICAL EXAM Vitals:   12/06/17 0453 12/06/17 0806 12/06/17 0850 12/06/17 1308  BP: 129/69  99/71 (!) 128/53  Pulse: 85  87 82  Resp: 18  18 14   Temp: 98.1 F (36.7 C)  98.3 F (36.8 C) 98.3 F (36.8 C)  TempSrc: Oral  Oral Oral  SpO2: 98% 95% 95% 97%  Weight: 234 lb 9.1 oz (106.4 kg)     Height:        PHYSICAL EXAM Physical exam: Exam: Gen: NAD Eyes: anicteric sclerae, moist conjunctivae                    CV: no MRG, no carotid bruits, no peripheral edema Mental Status: Alert, follows commands, good historian  Neuro: Detailed Neurologic Exam  Speech:    No aphasia, no dysarthria  Cranial Nerves:    The pupils are equal, round, and reactive to light.. Attempted, Fundi not visualized.  EOMI. No gaze preference. Visual fields full. Face symmetric, Tongue midline. Hearing intact to voice. Shoulder shrug intact  Motor Observation:    no involuntary movements noted. Tone appears normal.     Coordination: No Dysmetria on finger-to-nose ataxia  Strength:    5/5     Sensation:  Intact to LT  Plantars downgoing.      ASSESSMENT/PLAN Heather [name] is a 46 y.o. female with history of end-stage renal disease, hyperparathyroidism, morbid obesity,  neuropathy, hypertension, hyperlipidemia, migraine headaches, fibromyalgia, diabetes mellitus, and anemia of chronic disease presenting with a severe headache. She did not receive IV t-PA due to no focal neurologic deficits and late presentation.   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  Resultant  No deficit  CT head - New age-indeterminate infarct within the right cerebellum, potentially subacute.   MRI head - Subacute inferior right cerebellar nonhemorrhagic infarct.  MRV - Unremarkable   MRA head - Unremarkable   Carotid Doppler - 1-39% ICA plaquing. Vertebral artery flow is antegrade.   2D Echo - EF 60-65%. No cardiac source of emboli identified.  LDL - 169  HgbA1c - 6.4  VTE prophylaxis - subcutaneous heparin Diet Heart Room service appropriate? Yes; Fluid consistency: Thin  No antithrombotic prior to admission, now on aspirin 325 mg daily  Patient counseled to be compliant with her antithrombotic medications  Ongoing aggressive stroke risk factor management  Therapy recommendations:  pending  Disposition:  Pending  Hypertension  Occasional high blood pressures but Stable  Permissive hypertension (OK if < 220/120) but gradually normalize in 5-7 days  Long-term BP goal normotensive  Hyperlipidemia  Home meds:  No lipid lowering medications prior to admission  LDL 169, goal < 70  Now on Lipitor 80 mg daily  Continue statin at discharge  Diabetes  HgbA1c 6.4, goal < 7.0  Controlled  Other Stroke Risk Factors  Obesity, Body mass index is 34.64 kg/m., recommend weight loss, diet and exercise as appropriate   Family hx stroke (grandmother)   Other Active Problems  ESRD  Anemia of chronic disease  History of migraine headaches   Plan / Recommendations  TEE and Loop pending  Continue Lipitor  Continue aspirin   urine drug screen pending    Review of available 48 hours of telemetry strips revealed no  evidence of atrial fibrillation.  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 Patient may consider possible participation in the [place] registry if interested. Greater than 50% time during this 25 minute visit was spent on counseling and coordination of care about an embolic stroke and has answering questions. Discussed with Dr. Zigmund Daniel and her husband. Follow-up as outpatient in the stroke clinic in 6 weeks.  Antony Contras, MD Medical Director [facility] Pager: [phone] 12/06/2017 1:58 PM   To contact Stroke Continuity provider, please refer to [email or web address] After hours, contact General Neurology

## 2017-12-06 NOTE — Discharge Summary (Signed)
Physician Discharge Summary  Heather [name] WFU:[id number] DOB: 1971-06-02 DOA: 12/02/2017  PCP: Briscoe Deutscher, DO  Admit date: 12/02/2017 Discharge date: 12/06/2017  Admitted From    home Disposition: home Recommendations for Outpatient Follow-up:  1. Follow up with PCP in 1-2 weeks 2. Please obtain BMP/CBC in one week  Home Health:yes Equipment/Devices:none  Discharge Condition stable CODE STATUS full :Diet recommendation modified carb Brief/Interim Summary:46 y.o.femalewith medical history significant forchronic kidney disease stage V not yet on dialysis, insulin-dependent diabetes mellitus, hypertension, and anemia, now presenting to the emergency department for evaluation of a severe headache. Patient reports that she had experienced a similar severe headache 2 days earlier that resolved spontaneously after about a half an hour. She had a recurrence in the headache today, did not improve as quickly as the prior one did, and so she came into the ED for evaluation.Also reports some difficulty with chewing a french fry after the headache today. Denies choking.Denies fevers or chills, denies recent trauma, and denies focal numbness or weakness.  [facility]Upon arrival to the ED, patient is found to be chemistry panel is notable for a afebrile, saturating well on room air, initially hypertensive to 213/116, and with vitals otherwise stable. BUN of 41 and creatinine of 4.93. CBC features a stable normocytic anemia with hemoglobin of 10.3. Noncontrast head CT is notable for a new age-indeterminate infarct within the right cerebellum, possibly subacute. Neurology was consulted by the ED physician, recommended medical admission to [facility] was CTA head and neck MRI brain. Blood pressure trended down spontaneously, patient remained hemodynamically stable, and will be admitted to the telemetry unit for ongoing evaluation and headache and dysphagia  with CT evidence for ischemic CVA, likely subacute.    Discharge Diagnoses:  Principal Problem:   Stroke (cerebrum) ([place]) Active Problems:   Diabetes mellitus, type II, insulin dependent (HCC)   Essential hypertension   Anemia in chronic kidney disease   Chronic kidney disease, stage V (HCC)   Anemia of chronic disease   Hypertensive urgency   Acute intractable headache  Right cerebellar subacute infarct-MRA and MRV unremarkable.  MRI of the head shows subacute right cerebellar infarct no hemorrhage noted carotid Doppler shows no flow limitation. echo  shows EF 60-65% no cardiac emboli noted.tee no emboli.loop in place. -ckd 5 htn Dm hyperlipidemia  Plan-dc home today.follow up with ep dr Carleene Overlie taylor.   Discharge Instructions   Allergies as of 12/06/2017      Reactions   Ibuprofen Other (See Comments)   CKD stage 3. Should avoid.   Dilaudid [hydromorphone Hcl] Itching, Other (See Comments)   Can take with Benadryl.      Medication List    STOP taking these medications   ipratropium 0.03 % nasal spray Commonly known as:  ATROVENT     TAKE these medications   albuterol 108 (90 Base) MCG/ACT inhaler Commonly known as:  PROVENTIL HFA;VENTOLIN HFA Inhale 2 puffs into the lungs every 6 (six) hours as needed for wheezing or shortness of breath.   amLODipine 10 MG tablet Commonly known as:  NORVASC Take 10 mg by mouth daily.   calcitRIOL 0.25 MCG capsule Commonly known as:  ROCALTROL Take 0.5 mcg by mouth daily.   cetirizine 5 MG tablet Commonly known as:  ZYRTEC 1/2 po daily prn allergic rhinitis   epoetin alfa 10000 UNIT/ML injection Commonly known as:  EPOGEN,PROCRIT Inject 30,000 Units into the vein every 14 (fourteen) days.   fluticasone 220 MCG/ACT  inhaler Commonly known as:  FLOVENT HFA Inhale 2 puffs into the lungs daily.   fluticasone 50 MCG/ACT nasal spray Commonly known as:  FLONASE Place 2 sprays daily into both nostrils.   [place] by Does not apply route.   furosemide 80 MG tablet Commonly known as:  LASIX Take 1 tablet (80 mg total) by mouth 2 (two) times daily. What changed:  how much to take   gabapentin 300 MG capsule Commonly known as:  NEURONTIN Take 1 capsule (300 mg total) 2 (two) times daily as needed by mouth.   glucose blood test strip Commonly known as:  ONETOUCH VERIO Test 4 times daily. What changed:    how much to take  how to take this  when to take this  additional instructions   insulin aspart 100 UNIT/ML FlexPen Commonly known as:  NOVOLOG Inject under the skin 15 minutes before meals: 6 units before Breakfast, 8 units before Lunch, 8 units before Supper plus scale BG-150/25 up to 60 units daily   linaclotide 145 MCG Caps capsule Commonly known as:  LINZESS Take 1 capsule (145 mcg total) by mouth daily before breakfast.   lisinopril 20 MG tablet Commonly known as:  PRINIVIL,ZESTRIL Take 20 mg by mouth daily.   TRESIBA FLEXTOUCH 100 UNIT/ML Sopn FlexTouch Pen Generic drug:  insulin degludec Inject 28 Units into the skin daily with breakfast.       Allergies  Allergen Reactions  . Ibuprofen Other (See Comments)    CKD stage 3. Should avoid.  . Dilaudid [Hydromorphone Hcl] Itching and Other (See Comments)    Can take with Benadryl.    Consultations: Neuro,ep,cardiology  Procedures/Studies: Ct Head Wo Contrast  Result Date: 12/02/2017 CLINICAL DATA:  Acute onset headache.  History of migraines. EXAM: CT HEAD WITHOUT CONTRAST TECHNIQUE: Contiguous axial images were obtained from the base of the skull through the vertex without intravenous contrast. COMPARISON:  MRI brain dated April 22, 2015. CT head dated April 21, 2015. FINDINGS: Brain: New age-indeterminate infarct within the right cerebellum. No evidence of hemorrhage, hydrocephalus, extra-axial collection or mass lesion/mass effect. Vascular: Atherosclerotic vascular calcification of the carotid  siphons. No hyperdense vessel. Skull: Normal. Negative for fracture or focal lesion. Sinuses/Orbits: Partial opacification of the left mastoid air cells. The orbits are unremarkable. Other: None. IMPRESSION: 1. New age-indeterminate infarct within the right cerebellum, potentially subacute. Consider MRI for further evaluation as clinically indicated. Electronically Signed   By: Titus Dubin M.D.   On: 12/02/2017 14:00   Mr Brain Wo Contrast  Result Date: 12/03/2017 CLINICAL DATA:  Abnormal CT. Headaches for 2 days. Difficulty chewing and eating. EXAM: MRI HEAD WITHOUT CONTRAST MRA HEAD WITHOUT CONTRAST MRV HEAD WITHOUT CONTRAST TECHNIQUE: Multiplanar, multiecho pulse sequences of the brain and surrounding structures were obtained without intravenous contrast. Angiographic images of the head were obtained using MRA and MRV technique without contrast. COMPARISON:  None. FINDINGS: MRI HEAD FINDINGS Brain: The diffusion-weighted images confirm an acute/subacute nonhemorrhagic infarct involving the inferior right cerebellum. No other acute infarct is present. Associated T2 signal changes are compatible with a subacute time frame. Age advanced atrophy is present. Periventricular and subcortical T2 changes bilaterally are moderately advanced for age. The internal auditory canals are normal bilaterally. White matter changes extend into the brainstem Vascular: Flow is present in the major intracranial arteries. Skull and upper cervical spine: The skullbase is normal. The craniocervical junction is within normal limits. Sinuses/Orbits: The paranasal sinuses are clear. There is  fluid in left mastoid air cells. No obstructing nasopharyngeal lesion is present. Bilateral lens replacements are present. MRA HEAD FINDINGS Time-of-flight MRA head demonstrates normal signal in the internal carotid artery is from the high cervical segments through the ICA termini bilaterally. The A1 and M1 segments are normal. The anterior  communicating artery is patent. MCA bifurcations are within normal limits. ACA and MCA branch vessels are unremarkable. The right vertebral artery is dominant. Right PICA is visualized but the origin is below the field of view. The left AICA is dominant. The basilar artery is normal. Both posterior cerebral artery is originate from the basilar tip. PCA branch vessels are within normal limits. MRV HEAD FINDINGS MR venogram demonstrates normal patency of the dural sinuses. The right transverse sinus is dominant. The straight sinus deep cerebral veins are intact. Cortical veins are unremarkable. IMPRESSION: 1. Subacute inferior right cerebellar nonhemorrhagic infarct. 2. Periventricular and subcortical white matter disease bilaterally is moderately advanced for age. 3. White matter disease is likely secondary to distal small vessel disease. 4. MRA demonstrates no significant proximal stenosis, aneurysm, or branch vessel occlusion within the circle of Willis. 5. Major venous structures scratched at the major venous structures are patent. Electronically Signed   By: San Morelle M.D.   On: 12/03/2017 21:02   Mr Jodene Nam Head Wo Contrast  Result Date: 12/03/2017 CLINICAL DATA:  Abnormal CT. Headaches for 2 days. Difficulty chewing and eating. EXAM: MRI HEAD WITHOUT CONTRAST MRA HEAD WITHOUT CONTRAST MRV HEAD WITHOUT CONTRAST TECHNIQUE: Multiplanar, multiecho pulse sequences of the brain and surrounding structures were obtained without intravenous contrast. Angiographic images of the head were obtained using MRA and MRV technique without contrast. COMPARISON:  None. FINDINGS: MRI HEAD FINDINGS Brain: The diffusion-weighted images confirm an acute/subacute nonhemorrhagic infarct involving the inferior right cerebellum. No other acute infarct is present. Associated T2 signal changes are compatible with a subacute time frame. Age advanced atrophy is present. Periventricular and subcortical T2 changes bilaterally are  moderately advanced for age. The internal auditory canals are normal bilaterally. White matter changes extend into the brainstem Vascular: Flow is present in the major intracranial arteries. Skull and upper cervical spine: The skullbase is normal. The craniocervical junction is within normal limits. Sinuses/Orbits: The paranasal sinuses are clear. There is fluid in left mastoid air cells. No obstructing nasopharyngeal lesion is present. Bilateral lens replacements are present. MRA HEAD FINDINGS Time-of-flight MRA head demonstrates normal signal in the internal carotid artery is from the high cervical segments through the ICA termini bilaterally. The A1 and M1 segments are normal. The anterior communicating artery is patent. MCA bifurcations are within normal limits. ACA and MCA branch vessels are unremarkable. The right vertebral artery is dominant. Right PICA is visualized but the origin is below the field of view. The left AICA is dominant. The basilar artery is normal. Both posterior cerebral artery is originate from the basilar tip. PCA branch vessels are within normal limits. MRV HEAD FINDINGS MR venogram demonstrates normal patency of the dural sinuses. The right transverse sinus is dominant. The straight sinus deep cerebral veins are intact. Cortical veins are unremarkable. IMPRESSION: 1. Subacute inferior right cerebellar nonhemorrhagic infarct. 2. Periventricular and subcortical white matter disease bilaterally is moderately advanced for age. 3. White matter disease is likely secondary to distal small vessel disease. 4. MRA demonstrates no significant proximal stenosis, aneurysm, or branch vessel occlusion within the circle of Willis. 5. Major venous structures scratched at the major venous structures are patent. Electronically Signed  By: San Morelle M.D.   On: 12/03/2017 21:02   Mr Mrv Head Wo Cm  Result Date: 12/03/2017 CLINICAL DATA:  Abnormal CT. Headaches for 2 days. Difficulty chewing  and eating. EXAM: MRI HEAD WITHOUT CONTRAST MRA HEAD WITHOUT CONTRAST MRV HEAD WITHOUT CONTRAST TECHNIQUE: Multiplanar, multiecho pulse sequences of the brain and surrounding structures were obtained without intravenous contrast. Angiographic images of the head were obtained using MRA and MRV technique without contrast. COMPARISON:  None. FINDINGS: MRI HEAD FINDINGS Brain: The diffusion-weighted images confirm an acute/subacute nonhemorrhagic infarct involving the inferior right cerebellum. No other acute infarct is present. Associated T2 signal changes are compatible with a subacute time frame. Age advanced atrophy is present. Periventricular and subcortical T2 changes bilaterally are moderately advanced for age. The internal auditory canals are normal bilaterally. White matter changes extend into the brainstem Vascular: Flow is present in the major intracranial arteries. Skull and upper cervical spine: The skullbase is normal. The craniocervical junction is within normal limits. Sinuses/Orbits: The paranasal sinuses are clear. There is fluid in left mastoid air cells. No obstructing nasopharyngeal lesion is present. Bilateral lens replacements are present. MRA HEAD FINDINGS Time-of-flight MRA head demonstrates normal signal in the internal carotid artery is from the high cervical segments through the ICA termini bilaterally. The A1 and M1 segments are normal. The anterior communicating artery is patent. MCA bifurcations are within normal limits. ACA and MCA branch vessels are unremarkable. The right vertebral artery is dominant. Right PICA is visualized but the origin is below the field of view. The left AICA is dominant. The basilar artery is normal. Both posterior cerebral artery is originate from the basilar tip. PCA branch vessels are within normal limits. MRV HEAD FINDINGS MR venogram demonstrates normal patency of the dural sinuses. The right transverse sinus is dominant. The straight sinus deep cerebral  veins are intact. Cortical veins are unremarkable. IMPRESSION: 1. Subacute inferior right cerebellar nonhemorrhagic infarct. 2. Periventricular and subcortical white matter disease bilaterally is moderately advanced for age. 3. White matter disease is likely secondary to distal small vessel disease. 4. MRA demonstrates no significant proximal stenosis, aneurysm, or branch vessel occlusion within the circle of Willis. 5. Major venous structures scratched at the major venous structures are patent. Electronically Signed   By: San Morelle M.D.   On: 12/03/2017 21:02    (Echo, Carotid, EGD, Colonoscopy, ERCP)    Subjective:   Discharge Exam: Vitals:   12/06/17 0806 12/06/17 0850  BP:  99/71  Pulse:  87  Resp:  18  Temp:  98.3 F (36.8 C)  SpO2: 95% 95%   Vitals:   12/06/17 0036 12/06/17 0453 12/06/17 0806 12/06/17 0850  BP: (!) 135/59 129/69  99/71  Pulse: 86 85  87  Resp: 18 18  18   Temp: 98.2 F (36.8 C) 98.1 F (36.7 C)  98.3 F (36.8 C)  TempSrc: Oral Oral  Oral  SpO2: 95% 98% 95% 95%  Weight:  106.4 kg (234 lb 9.1 oz)    Height:        General: Pt is alert, awake, not in acute distress Cardiovascular: RRR, S1/S2 +, no rubs, no gallops Respiratory: CTA bilaterally, no wheezing, no rhonchi Abdominal: Soft, NT, ND, bowel sounds + Extremities: no edema, no cyanosis    The results of significant diagnostics from this hospitalization (including imaging, microbiology, ancillary and laboratory) are listed below for reference.     Microbiology: No results found for this or any previous visit (from the past  240 hour(s)).   Labs: BNP (last 3 results) No results for input(s): BNP in the last 8760 hours. Basic Metabolic Panel: Recent Labs  Lab 12/02/17 1330  NA 136  K 3.5  CL 104  CO2 25  GLUCOSE 118*  BUN 41*  CREATININE 4.93*  CALCIUM 8.3*   Liver Function Tests: No results for input(s): AST, ALT, ALKPHOS, BILITOT, PROT, ALBUMIN in the last 168  hours. No results for input(s): LIPASE, AMYLASE in the last 168 hours. No results for input(s): AMMONIA in the last 168 hours. CBC: Recent Labs  Lab 12/02/17 1330  WBC 8.9  NEUTROABS 6.3  HGB 10.3*  HCT 32.9*  MCV 94.0  PLT 358   Cardiac Enzymes: No results for input(s): CKTOTAL, CKMB, CKMBINDEX, TROPONINI in the last 168 hours. BNP: Invalid input(s): POCBNP CBG: Recent Labs  Lab 12/05/17 1119 12/05/17 1635 12/05/17 2142 12/06/17 0613 12/06/17 1130  GLUCAP 95 119* 155* 134* 189*   D-Dimer No results for input(s): DDIMER in the last 72 hours. Hgb A1c No results for input(s): HGBA1C in the last 72 hours. Lipid Profile No results for input(s): CHOL, HDL, LDLCALC, TRIG, CHOLHDL, LDLDIRECT in the last 72 hours. Thyroid function studies No results for input(s): TSH, T4TOTAL, T3FREE, THYROIDAB in the last 72 hours.  Invalid input(s): FREET3 Anemia work up No results for input(s): VITAMINB12, FOLATE, FERRITIN, TIBC, IRON, RETICCTPCT in the last 72 hours. Urinalysis    Component Value Date/Time   COLORURINE YELLOW 02/28/2017 1912   APPEARANCEUR CLEAR 02/28/2017 1912   LABSPEC 1.017 02/28/2017 1912   PHURINE 5.0 02/28/2017 1912   GLUCOSEU >=500 (A) 02/28/2017 1912   HGBUR SMALL (A) 02/28/2017 1912   BILIRUBINUR Negative 07/29/2017 0802   BILIRUBINUR neg 08/08/2013   KETONESUR NEGATIVE 02/28/2017 1912   PROTEINUR 2+ 07/29/2017 0802   PROTEINUR >=300 (A) 02/28/2017 1912   UROBILINOGEN 0.2 07/29/2017 0802   UROBILINOGEN 0.2 09/07/2015 1230   NITRITE Negative 07/29/2017 0802   NITRITE NEGATIVE 02/28/2017 1912   LEUKOCYTESUR Negative 07/29/2017 0802   Sepsis Labs Invalid input(s): PROCALCITONIN,  WBC,  LACTICIDVEN Microbiology No results found for this or any previous visit (from the past 240 hour(s)).   Time coordinating discharge: Over 30 minutes  SIGNED:   Georgette Shell, MD  [facility] 12/06/2017, 11:43 AM Pager   If 7PM-7AM, please  contact night-coverage [email or web address] Password [email or web address]

## 2017-12-07 ENCOUNTER — Encounter (HOSPITAL_COMMUNITY): Payer: Self-pay | Admitting: Cardiovascular Disease

## 2017-12-07 ENCOUNTER — Telehealth: Payer: Self-pay

## 2017-12-07 NOTE — Telephone Encounter (Signed)
Patient scheduled for follow up on 12/09/2047.  Unable to reach for TCM call.

## 2017-12-07 NOTE — Progress Notes (Signed)
12/07/2017 at 9:17 am: CM received phone call last pm at 8:00 from [place] stating they were trying to get in touch with MD about Heather [name] DME. CM returned their call this am. Heather [name] had not receive the orders from [place] that [place] faxed to [place] for the DME. They had the confirmation but no orders.  CM faxed the orders for the DME to [place] at [phone]. CM will call Heather [name] and update her on the process. CM reiterated to [place] that the patient needs this DME today.

## 2017-12-08 ENCOUNTER — Encounter: Payer: Self-pay | Admitting: Family Medicine

## 2017-12-08 ENCOUNTER — Ambulatory Visit (INDEPENDENT_AMBULATORY_CARE_PROVIDER_SITE_OTHER): Payer: Managed Care, Other (non HMO) | Admitting: Family Medicine

## 2017-12-08 VITALS — BP 140/82 | HR 88 | Temp 98.6°F | Wt 231.2 lb

## 2017-12-08 DIAGNOSIS — F329 Major depressive disorder, single episode, unspecified: Secondary | ICD-10-CM | POA: Diagnosis not present

## 2017-12-08 DIAGNOSIS — E1129 Type 2 diabetes mellitus with other diabetic kidney complication: Secondary | ICD-10-CM

## 2017-12-08 DIAGNOSIS — I69354 Hemiplegia and hemiparesis following cerebral infarction affecting left non-dominant side: Secondary | ICD-10-CM | POA: Diagnosis not present

## 2017-12-08 DIAGNOSIS — N3000 Acute cystitis without hematuria: Secondary | ICD-10-CM | POA: Diagnosis not present

## 2017-12-08 DIAGNOSIS — E1165 Type 2 diabetes mellitus with hyperglycemia: Secondary | ICD-10-CM

## 2017-12-08 DIAGNOSIS — R531 Weakness: Secondary | ICD-10-CM

## 2017-12-08 DIAGNOSIS — N185 Chronic kidney disease, stage 5: Secondary | ICD-10-CM

## 2017-12-08 DIAGNOSIS — IMO0002 Reserved for concepts with insufficient information to code with codable children: Secondary | ICD-10-CM

## 2017-12-08 DIAGNOSIS — I6300 Cerebral infarction due to thrombosis of unspecified precerebral artery: Secondary | ICD-10-CM

## 2017-12-08 LAB — URINE CULTURE

## 2017-12-08 MED ORDER — SERTRALINE HCL 25 MG PO TABS: 25.0000 mg | ORAL_TABLET | Freq: Every day | ORAL | 2 refills | Status: DC

## 2017-12-10 DIAGNOSIS — R531 Weakness: Secondary | ICD-10-CM

## 2017-12-10 DIAGNOSIS — F329 Major depressive disorder, single episode, unspecified: Secondary | ICD-10-CM

## 2017-12-10 HISTORY — DX: Major depressive disorder, single episode, unspecified: F32.9

## 2017-12-10 HISTORY — DX: Weakness: R53.1

## 2017-12-10 NOTE — Progress Notes (Signed)
Hospital visit follow up appointment.  I have reviewed the intake provided by my nurse or medical assistant today. I have reviewed the prior telephone transitional care contact documented in the patient's medical record.  SUBJECTIVE   Heather [name] is a 46 y.o. female here today for transitional care hospital follow up visit:  Primary Problems Addressed During hospital visit: 1. Chief reason for visit was severe headache.  She was subsequently diagnosed with a subacute right cerebellar infarct.  Patient states that on the night prior to her admission to the hospital, she had a severe headache.  She became very tired and went to sleep.  When she awoke, the headache was improved but not completely gone.  She did note some difficulty with speaking during the pain episode.  It started again after she went to work and she decided to go to the emergency department.  A noncontrast head CT was obtained at that time.  It revealed a subacute right cerebellar infarct.  She was then admitted.  Neurology and electrophysiology were consulted during this time.  MRI was consistent with showing a right cerebellar infarct.  MRA and MRV were unremarkable.  Carotid Doppler showed no flow limitation.  An echo showed an EF of 60-65% with no cardiac emboli noted.  Her blood pressure was monitored during this time and she was released with adequate pressure.    2. visit date was 12/02/2017 to 12/06/2017. 3. Medical records from the ER are available for review. 3. The patient d/c Hospital for the following primary diagnoses:   Subacute stroke, cerebellum Diabetes, type II, insulin-dependent Essential hypertension Chronic kidney disease, stage V Anemia of chronic kidney disease Hypertensive urgency Acute intractable headache  Medications (current list documented below) 1. New medications prescribed: None.  2. I have reviewed the medications with the patient or her caregiver. She or her caregiver can confirm  the names of the medications. 3. The patient is taking the correct medications.  Diagnostic Tests, Consultations, and Other Outstanding Issues 1. There were diagnostic tests still pending at the time of discharge. If applicable, these have been reviewed.  2. The patient does need additional tests or consults arranged. 3. Other issues needing attention today: Follow-up labs including a CBC and a CMP.  Home health and DME resources.  Depression treatment and resources as below.  Patient Empowerment and Education 1. The patient or her caregiver can provide a brief explanation of the reason for hospital visit and the nature of the care provided. 2. The patient or caregiver can explain how self-care/care in the home should be provided. 3. The patient and/or caregiver know what signs or symptoms that would indicate deterioration of the patient's condition and what should be done to best avoid rehospitalization if this occurs? Yes  Home Status 1. The patient has has had a significant decline in physical functioning since ER visit. The patient is independent in performing ADL's. 2. The patient has not had a significant decline in cognitive functioning as a result of hospitalization. 3. Home Health services have not been set up for the patient. The patient is not receiving these services.   4. The patient is not receiving care management services.   Communication 1. I have communicated with other members of the patient's care team such as the PCP, specialty physicians, care management, home health agency, or allied health providers about the patient today.  Additional Notes: Additional concerns addressed today include: Depression.  Patient presents today with obvious sadness.  Upon  questioning, she admits to significant anxiety and depression after the hospitalization.  [name] very concerned about having another stroke.  [name] also very concerned about becoming more dependent.  Depression screen PHQ  2/9 12/08/2017  Decreased Interest 1  Down, Depressed, Hopeless 3  PHQ - 2 Score 4  Altered sleeping 3  Tired, decreased energy 2  Change in appetite 3  Feeling bad or failure about yourself  2  Trouble concentrating 1  Moving slowly or fidgety/restless 1  Suicidal thoughts 1  PHQ-9 Score 17  Some recent data might be hidden   Current Outpatient Medications:  .  albuterol (PROVENTIL HFA;VENTOLIN HFA) 108 (90 Base) MCG/ACT inhaler, Inhale 2 puffs into the lungs every 6 (six) hours as needed for wheezing or shortness of breath., Disp: 1 Inhaler, Rfl: 2 .  amLODipine (NORVASC) 10 MG tablet, Take 10 mg by mouth daily., Disp: , Rfl:  .  calcitRIOL (ROCALTROL) 0.25 MCG capsule, Take 0.5 mcg by mouth daily. , Disp: , Rfl:  .  cetirizine (ZYRTEC) 5 MG tablet, 1/2 po daily prn allergic rhinitis, Disp: 24 tablet, Rfl: 0 .  Continuous Blood Gluc Sensor ([place]) MISC, by Does not apply route., Disp: , Rfl:  .  epoetin alfa (EPOGEN,PROCRIT) 35009 UNIT/ML injection, Inject 30,000 Units into the vein every 14 (fourteen) days. , Disp: , Rfl:  .  fluticasone (FLONASE) 50 MCG/ACT nasal spray, Place 2 sprays daily into both nostrils., Disp: 48 g, Rfl: 1 .  fluticasone (FLOVENT HFA) 220 MCG/ACT inhaler, Inhale 2 puffs into the lungs daily., Disp: 1 Inhaler, Rfl: 12 .  furosemide (LASIX) 80 MG tablet, Take 1 tablet (80 mg total) by mouth 2 (two) times daily. (Patient taking differently: Take 160 mg by mouth 2 (two) times daily. ), Disp: 60 tablet, Rfl: 0 .  gabapentin (NEURONTIN) 300 MG capsule, Take 1 capsule (300 mg total) 2 (two) times daily as needed by mouth., Disp: 60 capsule, Rfl: 3 .  glucose blood (ONETOUCH VERIO) test strip, Test 4 times daily. (Patient taking differently: 1 each by Other route 3 (three) times daily. ), Disp: 100 each, Rfl: 12 .  insulin aspart (NOVOLOG) 100 UNIT/ML FlexPen, Inject under the skin 15 minutes before meals: 6 units before Breakfast, 8 units before  Lunch, 8 units before Supper plus scale BG-150/25 up to 60 units daily, Disp: , Rfl:  .  insulin degludec (TRESIBA FLEXTOUCH) 100 UNIT/ML SOPN FlexTouch Pen, Inject 28 Units into the skin daily with breakfast. , Disp: , Rfl:  .  linaclotide (LINZESS) 145 MCG CAPS capsule, Take 1 capsule (145 mcg total) by mouth daily before breakfast., Disp: 30 capsule, Rfl: 5 .  lisinopril (PRINIVIL,ZESTRIL) 20 MG tablet, Take 20 mg by mouth daily., Disp: , Rfl:   ROS: Negative except Per HPI.  Objective:   Blood pressure 140/82, pulse 88, temperature 98.6 F (37 C), temperature source Oral, weight 231 lb 3.2 oz (104.9 kg), last menstrual period 11/16/2017, SpO2 97 %.  General: Alert, cooperative, appears stated age and no distress.  HEENT:  Normocephalic, without obvious abnormality, atraumatic. Conjunctivae/corneas clear. PERRL, EOM's intact. Normal TM's and external ear canals both ears. Nares normal. Septum midline. Mucosa normal. No drainage or sinus tenderness. Lips, mucosa, and tongue normal; teeth and gums normal.  Lungs: Clear to auscultation bilaterally.  Heart:: Regular rate and rhythm, S1, S2 normal, no murmur, click, rub or gallop.  Abdomen: Soft, non-tender; bowel sounds normal; no masses,  no organomegaly.  Extremities: Extremities  normal, atraumatic, no cyanosis or edema.  Pulses: 2+ and symmetric.  Skin: Skin color, texture, turgor normal. No rashes or lesions.  Neurologic: Alert and oriented X 3, decreased strength and tone of the left side.  Psych: Alert,oriented, in NAD with a full range of affect, normal behavior and no psychotic features   Assessment/Plan:   Emberlee was seen today for hospitalization follow-up.  Diagnoses and all orders for this visit:  Cerebrovascular accident (CVA) due to thrombosis of precerebral artery ([place]) Left-sided weakness Comments: Patient needs a few items for her to remain functional at home.  These were ordered today.  She has follow-up with  neurology for PT and for an office visit.  We will address the reactive depression as below.  Labs were to be ordered today but were missed and she will come back for them.  Patient also has an implanted monitor and a follow-up visit with electrophysiology.  [name] having no issues with this.  We reviewed the importance of healthy diet at this time.  [name] to monitor her blood pressure.  We reviewed a safety plan in case she does develop new symptoms. Orders: -     For home use only DME Shower stool -     For home use only DME 4 wheeled rolling walker with seat ([id number])  Reactive depression Comments: No red flags today.  However, the patient definitely needs treatment.  I have given her information for our in-house therapist, Trey Paula.  After discussion, patient would like to start below medication. Expectations, risks, and potential side effects reviewed.  Orders: -     sertraline (ZOLOFT) 25 MG tablet; Take 1 tablet (25 mg total) by mouth daily.  DM (diabetes mellitus), type 2, uncontrolled, with renal complications ([place]) Comments: Labs were to be ordered today but somehow were missed.  We will call the patient to come back in for a recheck of her CBC and CMP.  Chronic kidney disease, stage V ([place]) Comments: Labs were to be ordered today but somehow were missed.  We will call the patient to come back in for a recheck of her CBC and CMP.  The following changes were made to the patient's medication list today: Addition of Zoloft.  Additional items needing attention at subsequent follow up visits include: CBC, CMP.  Depression monitoring.  ADLs.  Patient and/or caregiver verbalized understanding and all questions were answered. Patient and/or caregiver is agreeable with the plan outlined above. No barriers to learning were identified.

## 2017-12-12 ENCOUNTER — Telehealth: Payer: Self-pay

## 2017-12-12 ENCOUNTER — Telehealth: Payer: Self-pay | Admitting: Family Medicine

## 2017-12-12 ENCOUNTER — Encounter (HOSPITAL_COMMUNITY): Payer: Managed Care, Other (non HMO)

## 2017-12-12 MED ORDER — AMOXICILLIN-POT CLAVULANATE 875-125 MG PO TABS: 1.0000 | ORAL_TABLET | Freq: Two times a day (BID) | ORAL | 0 refills | Status: DC

## 2017-12-12 NOTE — Telephone Encounter (Signed)
Copied from [place] [id number]. Topic: General - Other >> Dec 12, 2017  1:54 PM Hewitt Shorts wrote: Reason for CRM: pt is calling to check on status for FMLA Status fax to [facility] [phone]

## 2017-12-12 NOTE — Addendum Note (Signed)
Addended by: Briscoe Deutscher R on: 12/12/2017 11:00 AM   Modules accepted: Orders

## 2017-12-12 NOTE — Telephone Encounter (Signed)
Paperwork is on your desk.

## 2017-12-12 NOTE — Telephone Encounter (Signed)
Patient has orders for shower chair and walker in chart. Let me know if I can help.

## 2017-12-15 NOTE — Telephone Encounter (Signed)
Notified patient that paperwork is up front to pick up.

## 2017-12-16 ENCOUNTER — Ambulatory Visit (INDEPENDENT_AMBULATORY_CARE_PROVIDER_SITE_OTHER): Payer: Self-pay | Admitting: *Deleted

## 2017-12-16 DIAGNOSIS — I639 Cerebral infarction, unspecified: Secondary | ICD-10-CM

## 2017-12-16 DIAGNOSIS — Z4509 Encounter for adjustment and management of other cardiac device: Secondary | ICD-10-CM

## 2017-12-16 LAB — CUP PACEART INCLINIC DEVICE CHECK
Implantable Pulse Generator Implant Date: 20181210
MDC IDC SESS DTM: 20181221090046

## 2017-12-16 NOTE — Progress Notes (Signed)
Loop wound check in clinic. Steri-strips and tegaderm removed. Incision edges approximated without redness, swelling or drainage. Battery status: good. R-waves 0.55mV. No episodes. Pt educated on wound care and [facility] monitoring. Monthly summary reports and ROV with Dr. Lovena Le PRN.

## 2017-12-19 ENCOUNTER — Telehealth: Payer: Self-pay | Admitting: Family Medicine

## 2017-12-19 NOTE — Telephone Encounter (Signed)
Please see message below and advise.   Copied from [place] [phone]. Topic: Inquiry >> Dec 19, 2017 11:02 AM Corie Chiquito, NT wrote: Reason for CRM: Patient calling to check the status of he FMLA  paper work. Patient stated that she needs to have them turned in on December 27,2018. If someone could please give her a call back about this at [phone]

## 2017-12-19 NOTE — Telephone Encounter (Signed)
Left message for the patient that the paperwork was faxed on 12/15/17. I had placed the original forms up front for her to pick up. They are not up front any longer so I assume that the patient picked them up. There is a previous message on 12/15/17 letting the patient know that the forms was ready.

## 2017-12-22 ENCOUNTER — Telehealth: Payer: Self-pay | Admitting: Family Medicine

## 2017-12-22 NOTE — Telephone Encounter (Signed)
I have filled out the form and re faxed it. I have left a message letting the patient know.

## 2017-12-22 NOTE — Telephone Encounter (Signed)
I let patient know that they are in the packet and that Dr. Juleen China would be able to look at those tomorrow. Someone will call her when paperwork is ready.

## 2017-12-22 NOTE — Telephone Encounter (Signed)
Copied from [place]. Topic: Quick Communication - See Telephone Encounter >> Dec 22, 2017  2:25 PM Cleaster Corin, NT wrote: CRM for notification. See Telephone encounter for:   12/22/17. Patient calling to check the status of he FMLA  paper work. Patient stated that she needs to have them turned in on December 27,2018. If someone could please give her a call back about this at [phone] paperwork that pt. Has picked up already was just to return to work. Not for FMLA

## 2017-12-26 ENCOUNTER — Other Ambulatory Visit: Payer: Self-pay

## 2017-12-26 DIAGNOSIS — E1140 Type 2 diabetes mellitus with diabetic neuropathy, unspecified: Secondary | ICD-10-CM

## 2017-12-26 MED ORDER — GABAPENTIN 300 MG PO CAPS: 300.0000 mg | ORAL_CAPSULE | Freq: Two times a day (BID) | ORAL | 1 refills | Status: DC

## 2017-12-29 NOTE — Progress Notes (Signed)
Called in lipitor 80 mg qhs.she is already on baby aspirin.

## 2017-12-30 ENCOUNTER — Encounter: Payer: Self-pay | Admitting: Occupational Therapy

## 2017-12-30 ENCOUNTER — Encounter: Payer: Self-pay | Admitting: Rehabilitation

## 2017-12-30 ENCOUNTER — Ambulatory Visit: Payer: Managed Care, Other (non HMO) | Attending: Internal Medicine | Admitting: Rehabilitation

## 2017-12-30 ENCOUNTER — Ambulatory Visit: Payer: Managed Care, Other (non HMO) | Admitting: Occupational Therapy

## 2017-12-30 DIAGNOSIS — R2681 Unsteadiness on feet: Secondary | ICD-10-CM | POA: Diagnosis present

## 2017-12-30 DIAGNOSIS — M6281 Muscle weakness (generalized): Secondary | ICD-10-CM

## 2017-12-30 DIAGNOSIS — R2689 Other abnormalities of gait and mobility: Secondary | ICD-10-CM | POA: Insufficient documentation

## 2017-12-30 DIAGNOSIS — I69354 Hemiplegia and hemiparesis following cerebral infarction affecting left non-dominant side: Secondary | ICD-10-CM | POA: Diagnosis present

## 2017-12-30 NOTE — Patient Instructions (Signed)
1. Grip Strengthening (Resistive Putty)  Green putty    Squeeze putty using thumb and all fingers. Repeat _20___ times. Do __2__ sessions per day.   2. Roll putty into tube on table and pinch along the tube between index, middle and thumb.  Do 5 times. Do 2 sessions per day.     Copyright  VHI. All rights reserved.

## 2017-12-30 NOTE — Therapy (Signed)
[facility] [address] [place], [place], [address] Phone: [phone]   Fax:  [phone]  Physical Therapy Evaluation  Patient Details  Name: Heather [name] MRN: [id number] Date of Birth: 1971-02-03 Referring Provider: Briscoe Deutscher, DO   Encounter Date: 12/30/2017  PT End of Session - 12/30/17 1946    Visit Number  1    Number of Visits  13    Date for PT Re-Evaluation  02/13/18    Authorization Type  [employer]     PT Start Time  1403    PT Stop Time  1445    PT Time Calculation (min)  42 min    Activity Tolerance  Patient tolerated treatment well    Behavior During Therapy  [facility] for tasks assessed/performed       Past Medical History:  Diagnosis Date  . Allergy   . Anemia of chronic disease   . B12 deficiency 05/10/2014  . Blood transfusion without reported diagnosis   . Bronchitis   . Chronic constipation   . Chronic kidney disease   . Constipation   . Diabetic neuropathy, painful ([place]) 07/13/2013   Stop lyrica due to cost & SE of drowsy. Gabapentin helps Stay off hydrocodone: pt states she thinks "she was addicted" to med. "I don't want that anymore."   . DM2 (diabetes mellitus, type 2) ([place])   . Dyspnea    with exertion  . Fibromyalgia   . Gastroparesis due to DM   . GERD (gastroesophageal reflux disease)   . Headache   . Heart murmur, systolic 44/02/1539   Grade 1/6 ejection murmur per [facility] notes.   . Hyperlipemia   . Hypertension   . IBS (irritable bowel syndrome)   . Infertility   . Leiomyoma of uterus   . Morbid obesity due to excess calories ([place]) 11/16/2016  . Neuropathy   . Obesity   . Pancreatitis   . 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 Right-sided low back pain without sciatica 12/08/2015  . Secondary hyperthyroidism 11/27/2013   Followed by [facility], notes reviewed 01/01/15. PTH had improved with oral vitamin D: 133 to 85. Last 155-probably not taking  vit D.    [name] Upper airway cough syndrome 11/15/2016   rec off acei/ on pepcid hs 11/15/2016 >>>     Past Surgical History:  Procedure Laterality Date  . AV FISTULA PLACEMENT Left 05/09/2017   Procedure: INSERTION OF ARTERIOVENOUS (AV) GRAFT ARM (ARTEGRAFT);  Surgeon: Conrad Quechee, MD;  Location: [facility];  Service: Vascular;  Laterality: Left;  . BASCILIC VEIN TRANSPOSITION Left 08/31/2016   Procedure: BASILIC VEIN TRANSPOSITION FIRST STAGE;  Surgeon: Conrad Wewahitchka, MD;  Location: [facility];  Service: Vascular;  Laterality: Left;  . FOOT SURGERY Right    t"ook bone out- maybe hammer toe"  . LIGATION OF ARTERIOVENOUS  FISTULA Left 05/09/2017   Procedure: LIGATION OF ARTERIOVENOUS  FISTULA;  Surgeon: Conrad Crane, MD;  Location: [facility];  Service: Vascular;  Laterality: Left;  . LOOP RECORDER INSERTION N/A 12/05/2017   Procedure: LOOP RECORDER INSERTION;  Surgeon: Evans Lance, MD;  Location: [facility];  Service: Cardiovascular;  Laterality: N/A;  . MYOMECTOMY    . TEE WITHOUT CARDIOVERSION N/A 12/05/2017   Procedure: TRANSESOPHAGEAL ECHOCARDIOGRAM (TEE);  Surgeon: Sanda Klein, MD;  Location: [facility];  Service: Cardiovascular;  Laterality: N/A;  . VITRECTOMY Bilateral     There were no vitals filed for  this visit.   Subjective Assessment - 12/30/17 1408    Subjective  "I had a stroke on a Wednesday night, I went to sleep and went to work the next day.  I didn't feel good Friday again, so I went to the hospital and they said I had 2 seperate strokes."     Pertinent History  CVA on 12/02/17, DM with peripheral neuropathy, fibromyalgia     Limitations  House hold activities;Standing    Patient Stated Goals  "I want to feel stable on my feet."      Currently in Pain?  No/denies         [facility] PT Assessment - 12/30/17 0001      Assessment   Medical Diagnosis  R cerebellar CVA    Referring Provider  Briscoe Deutscher, DO    Onset Date/Surgical Date  12/02/17    Prior Therapy   acute PT      Precautions   Precautions  Fall      Balance Screen   Has the patient fallen in the past 6 months  No    Has the patient had a decrease in activity level because of a fear of falling?   Yes    Is the patient reluctant to leave their home because of a fear of falling?   Yes      [place]  Private residence    Living Arrangements  Spouse/significant other    Available Help at Discharge  Family;Available PRN/intermittently husband works 8-5, then 11-4    Type of [place]  Level entry    Home Layout  One level    [place] - 4 wheels;Shower seat;Hand held Loss adjuster, chartered       Prior Function   Level of Independence  Independent    Vocation  Full time employment    Vocation Requirements  deals with worker's comp claims-sits at [employer]  Used to like to [employer], likes to work puzzles      Cognition   Overall Cognitive Status  -- Reports slightly delayed processing/memory      Sensation   Light Touch  Impaired Detail    Light Touch Impaired Details  Impaired LLE;Impaired RUE;Impaired LUE;Impaired RLE history of neuropathy (decreased sensation below ankles)    Hot/Cold  Appears Intact    Proprioception  Impaired Detail    Proprioception Impaired Details  Impaired RLE;Impaired LLE      Coordination   Gross Motor Movements are Fluid and Coordinated  No    Heel Shin Test  decreased fluidity in LLE      Posture/Postural Control   Posture/Postural Control  Postural limitations    Postural Limitations  Flexed trunk      ROM / Strength   AROM / PROM / Strength  Strength      Strength   Overall Strength  Deficits    Overall Strength Comments  RLE WFL; LLE-hip flex 2/5 (seated), knee ext 4/5, knee flex 3+/5, ankle DF 2/5, PF 3/5      Transfers   Transfers  Sit to Stand;Stand to Sit    Sit to Stand  6: Modified independent (Device/Increase time)    Five time sit to stand comments   25.71 w/  single UE support on arm rest and other UE support on lap    Stand to Sit  6: Modified independent (Device/Increase time)  Ambulation/Gait   Ambulation/Gait  Yes    Ambulation/Gait Assistance  5: Supervision    Ambulation/Gait Assistance Details  Pt ambulates with wide BOS, BLEs in ER, and B feet in pronation.     Ambulation Distance (Feet)  115 Feet    Assistive device  None    Gait Pattern  Step-through pattern;Decreased arm swing - left;Decreased stride length;Decreased dorsiflexion - left;Trendelenburg;Trunk flexed;Wide base of support    Ambulation Surface  Level;Indoor    Gait velocity  15.47 secs=2.12 ft/sec             Objective measurements completed on examination: See above findings.              PT Education - 12/30/17 1946    Education provided  Yes    Education Details  POC, goals, evaluation findings    Person(s) Educated  Patient    Methods  Explanation    Comprehension  Verbalized understanding       PT Short Term Goals - 12/30/17 1954      PT SHORT TERM GOAL #1   Title  Pt will initiate HEP in order to indicate improved functional mobility and decreased fall risk.  (Target Date: 01/20/17)    Time  3    Period  Weeks    Status  New    Target Date  01/20/18      PT SHORT TERM GOAL #2   Title  Will assess DGI in order to better assess balance deficits and fall risk.      Time  3    Period  Weeks    Status  New      PT SHORT TERM GOAL #3   Title  Will assess 6MWT and will improve distance by 30' w/ LRAD in order to indicate improved endurance.      Time  3    Period  Weeks    Status  New        PT Long Term Goals - 12/30/17 1957      PT LONG TERM GOAL #1   Title  Pt will be independent with final HEP in order to indicate improved functional mobilty and decreased fall risk.  (Target Date: 02/13/17)    Time  6    Period  Weeks    Status  New    Target Date  02/13/18      PT LONG TERM GOAL #2   Title  Pt will perform 5TSS in  </= 20 secs without UE support in order to indicate decreased fall risk and improved functional strength.      Time  3    Period  Weeks    Status  New      PT LONG TERM GOAL #3   Title  Pt will improve gait speed to >/= 2.62 ft/sec w/ LRAD in order to indicate pt safe community ambulator.      Time  3    Period  Weeks    Status  New      PT LONG TERM GOAL #4   Title  Pt will score >19/24 on DGI in order to indicate decreased fall risk.      Time  6    Period  Weeks      PT LONG TERM GOAL #5   Title  Pt will improve 6MWT 150' from baseline with LRAD in order to indicate improved functional endurance.      Time  6    Period  Weeks    Status  New             Plan - 12/30/17 1948    Clinical Impression Statement  Pt presents s/p R cerebellar CVA x 2 on 12/02/17 with LUE/LE weakness, decreased balance and decreased endurance.  Pt with history significant for DM with peripheral neuropathy, fibromyalgia, and HTN that could impact progress in therapy.  Note that she also has significant B knee pain and is unable to perform stairs.  Recommended she mention this to MD at next visit.  Upon PT evaluation, note gait speed of 2.12 ft/sec indicative of decreased ability to ambulate at community level and 5TSS time of 25.71 secs with heavy support of UEs indicative of fall risk and decreased functional strength.  Pt will benefit from skilled OP neuro PT in order to address deficits.      Clinical Presentation  Evolving    Clinical Decision Making  Moderate    Rehab Potential  Good    Clinical Impairments Affecting Rehab Potential  pt motivated to return to normal function    PT Frequency  2x / week    PT Duration  6 weeks    PT Treatment/Interventions  ADLs/Self Care Home Management;Electrical Stimulation;DME Instruction;Gait training;Stair training;Functional mobility training;Therapeutic activities;Therapeutic exercise;Balance training;Neuromuscular re-education;Patient/family  education;Orthotic Fit/Training;Manual techniques;Passive range of motion;Energy conservation;Vestibular    PT Next Visit Plan  Assess DGI and update goal if needed, 6MWT, further assess dizziness, gait for improved quality (she has rollator, but only uses for longer distances), balance, initiate HEP    Consulted and Agree with Plan of Care  Patient       Patient will benefit from skilled therapeutic intervention in order to improve the following deficits and impairments:  Abnormal gait, Decreased activity tolerance, Decreased balance, Decreased endurance, Decreased knowledge of use of DME, Decreased mobility, Decreased strength, Dizziness, Impaired perceived functional ability, Impaired flexibility, Impaired sensation, Improper body mechanics  Visit Diagnosis: Hemiplegia and hemiparesis following cerebral infarction affecting left non-dominant side (HCC) - Plan: PT plan of care cert/re-cert  Unsteadiness on feet - Plan: PT plan of care cert/re-cert  Muscle weakness (generalized) - Plan: PT plan of care cert/re-cert  Other abnormalities of gait and mobility - Plan: PT plan of care cert/re-cert     Problem List Patient Active Problem List   Diagnosis Date Noted  . Reactive depression 12/10/2017  . Left-sided weakness 12/10/2017  . Acute intractable headache   . Hypertensive urgency 12/02/2017  . Stroke (cerebrum) ([place]) 12/02/2017  . Chest pain 06/30/2017  . Routine physical examination 06/30/2017  . Morbid obesity due to excess calories ([place]) 11/16/2016  . Upper airway cough syndrome 11/15/2016  . DM (diabetes mellitus), type 2, uncontrolled, with renal complications ([place]) 09/38/1829  . Anemia of chronic disease 07/22/2016  . Dyslipidemia associated with type 2 diabetes mellitus ([place]) 06/25/2016  . Uncontrolled type 2 diabetes mellitus with both eyes affected by proliferative retinopathy and macular edema, with long-term current use of insulin ([place]) 06/22/2016  . Uncontrolled type  2 diabetes mellitus with diabetic polyneuropathy, with long-term current use of insulin ([place]) 03/08/2016  . Uncontrolled type 2 diabetes mellitus with stage 4 chronic kidney disease, with long-term current use of insulin ([place]) 03/08/2016  . Right-sided low back pain without sciatica 12/08/2015  . Proliferative diabetic retinopathy ([place]) 09/29/2015  . Pseudophakia of both eyes 05/30/2015  . 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 B12 deficiency 05/10/2014  . Anemia in chronic kidney disease 04/26/2014  .  Chronic kidney disease, stage V ([place]) 04/26/2014  . Heart murmur, systolic 43/60/1658  . Secondary hyperthyroidism 11/27/2013  . Posterior subcapsular cataract, bilateral 10/18/2013  . Diabetic neuropathy, painful ([place]) 07/13/2013  . Diabetes mellitus, type II, insulin dependent ([place]) 02/19/2011  . Essential hypertension 02/19/2011  . Gastroparesis 02/19/2011    Cameron Sprang, PT, MPT [facility] [address] [place], [place], [address] Phone: [phone]   Fax:  [phone] 12/30/17, 8:06 PM  Name: Heather [name] MRN: [id number] Date of Birth: [birth year]

## 2017-12-30 NOTE — Therapy (Signed)
[facility] [address] [place], [place], [address] Phone: [phone]   Fax:  [phone]  Occupational Therapy Evaluation  Patient Details  Name: Heather [name] MRN: [id number] Date of Birth: 08-23-1971 No Data Recorded  Encounter Date: 12/30/2017  OT End of Session - 12/30/17 1559    Visit Number  1    Number of Visits  1    Date for OT Re-Evaluation  -- n/a    OT Start Time  1447    OT Stop Time  1528    OT Time Calculation (min)  41 min    Activity Tolerance  Patient tolerated treatment well       Past Medical History:  Diagnosis Date  . Allergy   . Anemia of chronic disease   . B12 deficiency 05/10/2014  . Blood transfusion without reported diagnosis   . Bronchitis   . Chronic constipation   . Chronic kidney disease   . Constipation   . Diabetic neuropathy, painful ([place]) 07/13/2013   Stop lyrica due to cost & SE of drowsy. Gabapentin helps Stay off hydrocodone: pt states she thinks "she was addicted" to med. "I don't want that anymore."   . DM2 (diabetes mellitus, type 2) ([place])   . Dyspnea    with exertion  . Fibromyalgia   . Gastroparesis due to DM   . GERD (gastroesophageal reflux disease)   . Headache   . Heart murmur, systolic 31/03/9701   Grade 1/6 ejection murmur per [facility] notes.   . Hyperlipemia   . Hypertension   . IBS (irritable bowel syndrome)   . Infertility   . Leiomyoma of uterus   . Morbid obesity due to excess calories ([place]) 11/16/2016  . Neuropathy   . Obesity   . Pancreatitis   . 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 Right-sided low back pain without sciatica 12/08/2015  . Secondary hyperthyroidism 11/27/2013   Followed by [facility], notes reviewed 01/01/15. PTH had improved with oral vitamin D: 133 to 85. Last 155-probably not taking vit D.    [name] Upper airway cough syndrome 11/15/2016   rec off acei/ on pepcid hs 11/15/2016 >>>     Past  Surgical History:  Procedure Laterality Date  . AV FISTULA PLACEMENT Left 05/09/2017   Procedure: INSERTION OF ARTERIOVENOUS (AV) GRAFT ARM (ARTEGRAFT);  Surgeon: Conrad Wheeler, MD;  Location: [facility];  Service: Vascular;  Laterality: Left;  . BASCILIC VEIN TRANSPOSITION Left 08/31/2016   Procedure: BASILIC VEIN TRANSPOSITION FIRST STAGE;  Surgeon: Conrad Stan[place], MD;  Location: [facility];  Service: Vascular;  Laterality: Left;  . FOOT SURGERY Right    t"ook bone out- maybe hammer toe"  . LIGATION OF ARTERIOVENOUS  FISTULA Left 05/09/2017   Procedure: LIGATION OF ARTERIOVENOUS  FISTULA;  Surgeon: Conrad [place], MD;  Location: [facility];  Service: Vascular;  Laterality: Left;  . LOOP RECORDER INSERTION N/A 12/05/2017   Procedure: LOOP RECORDER INSERTION;  Surgeon: Evans Lance, MD;  Location: [facility];  Service: Cardiovascular;  Laterality: N/A;  . MYOMECTOMY    . TEE WITHOUT CARDIOVERSION N/A 12/05/2017   Procedure: TRANSESOPHAGEAL ECHOCARDIOGRAM (TEE);  Surgeon: Sanda Klein, MD;  Location: [facility];  Service: Cardiovascular;  Laterality: N/A;  . VITRECTOMY Bilateral     There were no vitals filed for this visit.  Subjective Assessment - 12/30/17 1454    Subjective   I get tired so easy.  Pertinent History  R cerebellar CVA on 12/02/2017. PHM:  Dm, chronic kidney disease, HTN, HLD, migraines, diabetic neuropathy, fibromyalgia, DVD, morbid obesity.     Patient Stated Goals  I want my balance to get better.  I also tire out really easy. I want my endurance to be better.     Currently in Pain?  No/denies        [facility] OT Assessment - 12/30/17 1455      Assessment   Medical Diagnosis  R cerebellar CVA    Referring Provider  Briscoe Deutscher, DO    Onset Date/Surgical Date  12/02/17    Prior Therapy  acute Pt      Precautions   Precautions  Fall      Restrictions   Weight Bearing Restrictions  No      Balance Screen   Has the patient fallen in the past 6 months  No had PT  eval today      Home  Environment   Family/patient expects to be discharged to:  Private residence    Living Arrangements  Spouse/significant other    Type of [place]  One level    Bathroom Insurance account manager    Additional Comments  3 in 1 over commode, RW, uses 3 in 1 in shower as well.       Prior Function   Level of Independence  Independent    Vocation  Full time employment    Vocation Requirements  workers comp claims    Leisure  likes to bowl and do puzzles      ADL   Eating/Feeding  Independent    Grooming  Independent    Upper Body Bathing  Independent    Lower Body Bathing  Modified independent    Upper Body Dressing  Independent    Lower Body Dressing  Independent    Toilet Transfer  Modified independent uses RW    Toileting - Astronomer -  Control and instrumentation engineer  Min guard    ADL comments  see above for equipment       IADL   Shopping  Needs to be accompanied on any shopping trip pt able to walk into store with RW and shop/supervision    Light Housekeeping  Performs light daily tasks but cannot maintain acceptable level of cleanliness    Meal Prep  -- n/a husband did all the cooking prior to [place] own vehicle    Medication Management  Is responsible for taking medication in correct dosages at correct time    Psychiatrist financial matters independently (budgets, writes checks, pays rent, bills goes to bank), collects and keeps track of income      Mobility   Mobility Status  Needs assist    Mobility Status Comments  supervision in community, contact guard into and out of shower "because I fell afraid"      Written Expression   Dominant Hand  Right      Vision - History   Baseline Vision  No visual deficits    Additional Comments  Pt denies any visual changes since stroke      Activity Tolerance    Activity Tolerance  Tolerates 30 min activity with multiple rests      Cognition   Cognition Comments  Pt  reports that "I know exactly what I want to say but I feel like I am much slower getting my thoughts out - I feel like I am processing things a little slower. "      Sensation   Light Touch  Appears Intact pt reports light touch in intact in hands/same on both sides    Hot/Cold  Appears Intact UE's    Proprioception  Appears Intact UE's      Coordination   Gross Motor Movements are Fluid and Coordinated  Yes    Finger Nose Finger Test  WFL's      Tone   Assessment Location  Right Upper Extremity;Left Upper Extremity      ROM / Strength   AROM / PROM / Strength  AROM;Strength      AROM   Overall AROM   Within functional limits for tasks performed    Overall AROM Comments  For BUE's      Strength   Overall Strength  Deficits    Overall Strength Comments  UE's WFL's except L grip strength - see below.       Hand Function   Right Hand Gross Grasp  Functional    Right Hand Grip (lbs)  55    Left Hand Gross Grasp  Impaired    Left Hand Grip (lbs)  30      RUE Tone   RUE Tone  Within Functional Limits      LUE Tone   LUE Tone  Within Functional Limits                      OT Education - 12/30/17 1550    Education provided  Yes    Education Details  green putty program to address L grip and pinch strength    Person(s) Educated  Patient    Methods  Explanation;Demonstration;Handout    Comprehension  Verbalized understanding;Returned demonstration          OT Long Term Goals - 12/30/17 1550      OT LONG TERM GOAL #1   Title  n/a            Plan - 12/30/17 1550    Clinical Impression Statement  Pt is a 47 year old female s/p R cerebellar CVA on 12/72018. Pt was discharged home on 12/06/2017    Occupational Profile and client history currently impacting functional performance  PMH:  DM, HTN, HLD, chronic kidney disease, diabetic  neuropathy, morbid obesity, migraines, fibromalgia.        Occupational performance deficits (Please refer to evaluation for details):  IADL's;Rest and Sleep    Rehab Potential  -- n/a    OT Frequency  -- n/a    Plan  pt given HEP for grip strength and pinch strength (putty)  PT to address balance and mobility issues.  No further OT recommended at this time.   Clinical Decision Making  Limited treatment options, no task modification necessary    Recommended Other Services  Pt reports some slowed processing and at times slower to get thoughts out when speaking to others. Pt to monitor and ask for ST eval if this does not resolve.     Consulted and Agree with Plan of Care  Patient       Patient will benefit from skilled therapeutic intervention in order to improve the following deficits and impairments:  Abnormal gait, Decreased activity tolerance, Decreased balance, Decreased strength  Visit Diagnosis: Muscle weakness (generalized)  Problem List Patient Active Problem List   Diagnosis Date Noted  . Reactive depression 12/10/2017  . Left-sided weakness 12/10/2017  . Acute intractable headache   . Hypertensive urgency 12/02/2017  . Stroke (cerebrum) ([place]) 12/02/2017  . Chest pain 06/30/2017  . Routine physical examination 06/30/2017  . Morbid obesity due to excess calories ([place]) 11/16/2016  . Upper airway cough syndrome 11/15/2016  . DM (diabetes mellitus), type 2, uncontrolled, with renal complications ([place]) 46/65/9935  . Anemia of chronic disease 07/22/2016  . Dyslipidemia associated with type 2 diabetes mellitus ([place]) 06/25/2016  . Uncontrolled type 2 diabetes mellitus with both eyes affected by proliferative retinopathy and macular edema, with long-term current use of insulin ([place]) 06/22/2016  . Uncontrolled type 2 diabetes mellitus with diabetic polyneuropathy, with long-term current use of insulin ([place]) 03/08/2016  . Uncontrolled type 2 diabetes mellitus with stage 4 chronic  kidney disease, with long-term current use of insulin ([place]) 03/08/2016  . Right-sided low back pain without sciatica 12/08/2015  . Proliferative diabetic retinopathy ([place]) 09/29/2015  . Pseudophakia of both eyes 05/30/2015  . 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 B12 deficiency 05/10/2014  . Anemia in chronic kidney disease 04/26/2014  . Chronic kidney disease, stage V ([place]) 04/26/2014  . Heart murmur, systolic 70/17/7939  . Secondary hyperthyroidism 11/27/2013  . Posterior subcapsular cataract, bilateral 10/18/2013  . Diabetic neuropathy, painful ([place]) 07/13/2013  . Diabetes mellitus, type II, insulin dependent ([place]) 02/19/2011  . Essential hypertension 02/19/2011  . Gastroparesis 02/19/2011    Quay Burow , OTR/L 12/30/2017, 4:01 PM  [facility] [address] [place], [place], [address] Phone: [phone]   Fax:  [phone]  Name: ZOLLIE CLEMENCE MRN: [id number] Date of Birth: 04/22/1971

## 2018-01-02 ENCOUNTER — Telehealth: Payer: Self-pay | Admitting: Radiology

## 2018-01-02 ENCOUNTER — Encounter: Payer: Self-pay | Admitting: Family Medicine

## 2018-01-02 ENCOUNTER — Ambulatory Visit (INDEPENDENT_AMBULATORY_CARE_PROVIDER_SITE_OTHER): Payer: Managed Care, Other (non HMO) | Admitting: Family Medicine

## 2018-01-02 ENCOUNTER — Ambulatory Visit: Payer: Managed Care, Other (non HMO) | Admitting: Physical Therapy

## 2018-01-02 ENCOUNTER — Encounter: Payer: Self-pay | Admitting: Physical Therapy

## 2018-01-02 ENCOUNTER — Other Ambulatory Visit: Payer: Self-pay

## 2018-01-02 VITALS — BP 140/80 | HR 88 | Temp 97.8°F | Wt 239.8 lb

## 2018-01-02 DIAGNOSIS — I69354 Hemiplegia and hemiparesis following cerebral infarction affecting left non-dominant side: Secondary | ICD-10-CM | POA: Diagnosis not present

## 2018-01-02 DIAGNOSIS — E119 Type 2 diabetes mellitus without complications: Secondary | ICD-10-CM

## 2018-01-02 DIAGNOSIS — R198 Other specified symptoms and signs involving the digestive system and abdomen: Secondary | ICD-10-CM

## 2018-01-02 DIAGNOSIS — F329 Major depressive disorder, single episode, unspecified: Secondary | ICD-10-CM

## 2018-01-02 DIAGNOSIS — I10 Essential (primary) hypertension: Secondary | ICD-10-CM | POA: Diagnosis not present

## 2018-01-02 DIAGNOSIS — R600 Localized edema: Secondary | ICD-10-CM

## 2018-01-02 DIAGNOSIS — M6281 Muscle weakness (generalized): Secondary | ICD-10-CM

## 2018-01-02 DIAGNOSIS — Z794 Long term (current) use of insulin: Secondary | ICD-10-CM

## 2018-01-02 DIAGNOSIS — E1140 Type 2 diabetes mellitus with diabetic neuropathy, unspecified: Secondary | ICD-10-CM

## 2018-01-02 DIAGNOSIS — R2681 Unsteadiness on feet: Secondary | ICD-10-CM

## 2018-01-02 DIAGNOSIS — E538 Deficiency of other specified B group vitamins: Secondary | ICD-10-CM | POA: Diagnosis not present

## 2018-01-02 DIAGNOSIS — N979 Female infertility, unspecified: Secondary | ICD-10-CM | POA: Insufficient documentation

## 2018-01-02 DIAGNOSIS — E559 Vitamin D deficiency, unspecified: Secondary | ICD-10-CM | POA: Insufficient documentation

## 2018-01-02 DIAGNOSIS — N185 Chronic kidney disease, stage 5: Secondary | ICD-10-CM | POA: Diagnosis not present

## 2018-01-02 DIAGNOSIS — R2689 Other abnormalities of gait and mobility: Secondary | ICD-10-CM

## 2018-01-02 LAB — COMPREHENSIVE METABOLIC PANEL
ALT: 13 U/L (ref 0–35)
AST: 11 U/L (ref 0–37)
Albumin: 2.9 g/dL — ABNORMAL LOW (ref 3.5–5.2)
Alkaline Phosphatase: 72 U/L (ref 39–117)
BUN: 29 mg/dL — ABNORMAL HIGH (ref 6–23)
CO2: 24 mEq/L (ref 19–32)
Calcium: 8.1 mg/dL — ABNORMAL LOW (ref 8.4–10.5)
Chloride: 105 mEq/L (ref 96–112)
Creatinine, Ser: 4.09 mg/dL — ABNORMAL HIGH (ref 0.40–1.20)
GFR: 14.97 mL/min — CL (ref >60.00)
Glucose, Bld: 184 mg/dL — ABNORMAL HIGH (ref 70–99)
Potassium: 4.3 mEq/L (ref 3.5–5.1)
Sodium: 136 mEq/L (ref 135–145)
Total Bilirubin: 0.4 mg/dL (ref 0.2–1.2)
Total Protein: 5.8 g/dL — ABNORMAL LOW (ref 6.0–8.3)

## 2018-01-02 LAB — VITAMIN B12: Vitamin B-12: 368 pg/mL (ref 211–911)

## 2018-01-02 LAB — BRAIN NATRIURETIC PEPTIDE: Pro B Natriuretic peptide (BNP): 32 pg/mL (ref 0.0–100.0)

## 2018-01-02 MED ORDER — TORSEMIDE 20 MG PO TABS: 20.0000 mg | ORAL_TABLET | Freq: Two times a day (BID) | ORAL | 0 refills | Status: DC

## 2018-01-02 MED ORDER — SERTRALINE HCL 25 MG PO TABS: 25.0000 mg | ORAL_TABLET | Freq: Every day | ORAL | 0 refills | Status: DC

## 2018-01-02 NOTE — Progress Notes (Signed)
Heather [name] is a 47 y.o. female here for a FOLLOW UP visit.  History of Present Illness:   [name], CMA acting as scribe for Dr. Briscoe Deutscher.   Back Pain  This is a new problem. The current episode started more than 1 year ago. The problem occurs constantly. The problem is unchanged. The quality of the pain is described as stabbing. The pain is at a severity of 8/10. The pain is severe. The pain is the same all the time. The symptoms are aggravated by sitting. Associated symptoms include abdominal pain. She has tried muscle relaxant and heat for the symptoms. The treatment provided mild relief.  Abdominal Pain  This is a new problem. The current episode started 1 to 4 weeks ago. The onset quality is sudden. The problem occurs every several days. The problem has been unchanged. The pain is located in the RLQ. The pain is at a severity of 4/10. The quality of the pain is sharp. The abdominal pain does not radiate.   PMHx, SurgHx, SocialHx, Medications, and Allergies were reviewed in the Visit Navigator and updated as appropriate.  Current Medications:   .  calcitRIOL (ROCALTROL) 0.25 MCG capsule, Take 0.5 mcg by mouth daily. , Disp: , Rfl:  .  cetirizine (ZYRTEC) 5 MG tablet, 1/2 po daily prn allergic rhinitis, Disp: 24 tablet, Rfl: 0 .  Continuous Blood Gluc Sensor ([place]) MISC, by Does not apply route., Disp: , Rfl:  .  epoetin alfa (EPOGEN,PROCRIT) 03546 UNIT/ML injection, Inject 30,000 Units into the vein every 14 (fourteen) days. , Disp: , Rfl:  .  fluticasone (FLONASE) 50 MCG/ACT nasal spray, Place 2 sprays daily into both nostrils., Disp: 48 g, Rfl: 1 .  fluticasone (FLOVENT HFA) 220 MCG/ACT inhaler, Inhale 2 puffs into the lungs daily., Disp: 1 Inhaler, Rfl: 12 .  furosemide (LASIX) 80 MG tablet, Take 1 tablet (80 mg total) by mouth 2 (two) times daily. (Patient taking differently: Take 160 mg by mouth 2 (two) times daily. ), Disp: 60 tablet, Rfl:  0 .  gabapentin (NEURONTIN) 300 MG capsule, Take 1 capsule (300 mg total) by mouth 2 (two) times daily., Disp: 180 capsule, Rfl: 1 .  insulin aspart (NOVOLOG) 100 UNIT/ML FlexPen, Inject under the skin 15 minutes before meals: 6 units before Breakfast, 8 units before Lunch, 8 units before Supper plus scale BG-150/25 up to 60 units daily, Disp: , Rfl:  .  insulin degludec (TRESIBA FLEXTOUCH) 100 UNIT/ML SOPN FlexTouch Pen, Inject 28 Units into the skin daily with breakfast. , Disp: , Rfl:  .  linaclotide (LINZESS) 145 MCG CAPS capsule, Take 1 capsule (145 mcg total) by mouth daily before breakfast., Disp: 30 capsule, Rfl: 5 .  sertraline (ZOLOFT) 25 MG tablet, Take 1 tablet (25 mg total) by mouth daily., Disp: 30 tablet, Rfl: 2   Allergies  Allergen Reactions  . Ibuprofen Other (See Comments)    CKD stage 3. Should avoid.  . Dilaudid [Hydromorphone Hcl] Itching and Other (See Comments)    Can take with Benadryl.   Review of Systems:   Pertinent items are noted in the HPI. Otherwise, ROS is negative.  Vitals:   Vitals:   01/02/18 0754  BP: 140/80  Pulse: 88  Temp: 97.8 F (36.6 C)  TempSrc: Oral  SpO2: 94%  Weight: 239 lb 12.8 oz (108.8 kg)     Body mass index is 35.41 kg/m.  Physical Exam:   Physical Exam  Constitutional: She  appears well-developed and well-nourished. She does not appear ill.  HENT:  Head: Normocephalic and atraumatic.  Eyes: EOM are normal. Pupils are equal, round, and reactive to light.  Neck: Normal range of motion. Neck supple.  Cardiovascular: Normal rate, normal heart sounds and intact distal pulses.  Pulmonary/Chest: Effort normal.  Abdominal: Soft. Bowel sounds are normal.    Skin: Skin is warm.  Psychiatric: She has a normal mood and affect. Her behavior is normal.  Nursing note and vitals reviewed.   Results for orders placed or performed in visit on 01/02/18  Comprehensive metabolic panel  Result Value Ref Range   Sodium 136 135 - 145  mEq/L   Potassium 4.3 3.5 - 5.1 mEq/L   Chloride 105 96 - 112 mEq/L   CO2 24 19 - 32 mEq/L   Glucose, Bld 184 (H) 70 - 99 mg/dL   BUN 29 (H) 6 - 23 mg/dL   Creatinine, Ser 4.09 (H) 0.40 - 1.20 mg/dL   Total Bilirubin 0.4 0.2 - 1.2 mg/dL   Alkaline Phosphatase 72 39 - 117 U/L   AST 11 0 - 37 U/L   ALT 13 0 - 35 U/L   Total Protein 5.8 (L) 6.0 - 8.3 g/dL   Albumin 2.9 (L) 3.5 - 5.2 g/dL   Calcium 8.1 (L) 8.4 - 10.5 mg/dL   GFR 14.97 (LL) >60.00 mL/min  Vitamin B12  Result Value Ref Range   Vitamin B-12 368 211 - 911 pg/mL  Brain natriuretic peptide  Result Value Ref Range   Pro B Natriuretic peptide (BNP) 32.0 0.0 - 100.0 pg/mL   Assessment and Plan:   1. Essential hypertension Review: no chest pain on exertion, no swelling of ankles, noting swelling of ankles.   Wt Readings from Last 3 Encounters:  01/02/18 239 lb 12.8 oz (108.8 kg)  12/08/17 231 lb 3.2 oz (104.9 kg)  12/06/17 234 lb 9.1 oz (106.4 kg)   BP Readings from Last 3 Encounters:  01/02/18 140/80  12/08/17 140/82  12/06/17 (!) 169/75   Lab Results  Component Value Date   CREATININE 4.09 (H) 01/02/2018   - Comprehensive metabolic panel  2. Diabetes mellitus, type II, insulin dependent ([place]) Current symptoms: no polyuria or polydipsia, no chest pain, dyspnea or TIA's.   Lab Results  Component Value Date   HGBA1C 6.4 (H) 12/03/2017    Lab Results  Component Value Date   MICROALBUR 66.1 (H) 04/07/2015    Lab Results  Component Value Date   CHOL 249 (H) 12/03/2017   HDL 48 12/03/2017   LDLCALC 169 (H) 12/03/2017   LDLDIRECT 120.0 04/17/2015   TRIG 158 (H) 12/03/2017   CHOLHDL 5.2 12/03/2017     Wt Readings from Last 3 Encounters:  01/02/18 239 lb 12.8 oz (108.8 kg)  12/08/17 231 lb 3.2 oz (104.9 kg)  12/06/17 234 lb 9.1 oz (106.4 kg)   BP Readings from Last 3 Encounters:  01/02/18 140/80  12/08/17 140/82  12/06/17 (!) 169/75   Lab Results  Component Value Date   CREATININE 4.09 (H)  01/02/2018    3. Reactive depression Improving with Zoloft. Still having trouble going to sleep.   4. Bilateral lower extremity edema She has been taking Lasix BID. Went up to TID on her own last night.   - torsemide (DEMADEX) 20 MG tablet; Take 1 tablet (20 mg total) by mouth 2 (two) times daily.  Dispense: 60 tablet; Refill: 0 - Brain natriuretic peptide  5. B12 deficiency -  Vitamin B12  6. Chronic kidney disease, stage V (HCC) Assessment: CKD: stage 5 - GFR < 15 stable. Plan: cardiovascular risk factor reduction including diabetes mellitus, dyslipidemia, hypertension, obesity (BMI >= 30 kg/m2) and sedentary lifestyle, glycemic control, blood pressure control and consult physician early if nausea, vomiting, or diarrhea.   7. Diabetic neuropathy, painful ([place]) Continue renal-dosed Neurontin.   8. Abdominal fullness - US Abdomen   . Reviewed expectations re: course of current medical issues. . Discussed self-management of symptoms. . Outlined signs and symptoms indicating need for more acute intervention. . Patient verbalized understanding and all questions were answered. [name] Health Maintenance issues including appropriate healthy diet, exercise, and smoking avoidance were discussed with patient. . See orders for this visit as documented in the electronic medical record. . Patient received an After Visit Summary.  CMA served as Education administrator during this visit. History, Physical, and Plan performed by medical provider. The above documentation has been reviewed and is accurate and complete. Briscoe Deutscher, D.O.  Briscoe Deutscher, DO [place] [place] 01/02/2018  Records requested if needed. Time spent with the patient: 45 minutes, of which >50% was spent in obtaining information about her symptoms, reviewing her previous labs, evaluations, and treatments, counseling her about her condition (please see the discussed topics above), and developing a plan to further investigate it; she had a  number of questions which I addressed.   High decision making.

## 2018-01-02 NOTE — Telephone Encounter (Signed)
CRITICAL VALUE STICKER  CRITICAL VALUE:GFR 14.97  RECEIVER (on-site recipient of call): Kayren Eaves, RT(R)  DATE & TIME NOTIFIED: 01/02/2018  MESSENGER (representative from lab): Hope  MD NOTIFIED: Dr. Juleen China   TIME OF NOTIFICATION: 11:23 am  RESPONSE:

## 2018-01-02 NOTE — Telephone Encounter (Signed)
FYI

## 2018-01-02 NOTE — Patient Instructions (Addendum)
I changed your Lasix to Torsemide.   I will look into calling a statin to your pharmacy.  I may order an ultrasound of your abdomen.

## 2018-01-02 NOTE — Therapy (Signed)
[facility] [address] [place], [place], [address] Phone: [phone]   Fax:  [phone]  Physical Therapy Treatment  Patient Details  Name: Heather [name] MRN: [id number] Date of Birth: 05-Jan-1971 Referring Provider: Briscoe Deutscher, DO   Encounter Date: 01/02/2018  PT End of Session - 01/02/18 1238    Visit Number  2    Number of Visits  13    Date for PT Re-Evaluation  02/13/18    Authorization Type  [employer]     PT Start Time  1234    PT Stop Time  1317    PT Time Calculation (min)  43 min    Equipment Utilized During Treatment  Gait belt    Activity Tolerance  Patient tolerated treatment well    Behavior During Therapy  [facility] for tasks assessed/performed       Past Medical History:  Diagnosis Date  . Allergy   . Anemia of chronic disease   . B12 deficiency 05/10/2014  . Blood transfusion without reported diagnosis   . Bronchitis   . Chronic constipation   . Chronic kidney disease   . Constipation   . Diabetic neuropathy, painful ([place]) 07/13/2013   Stop lyrica due to cost & SE of drowsy. Gabapentin helps Stay off hydrocodone: pt states she thinks "she was addicted" to med. "I don't want that anymore."   . DM2 (diabetes mellitus, type 2) ([place])   . Dyspnea    with exertion  . Fibromyalgia   . Gastroparesis due to DM   . GERD (gastroesophageal reflux disease)   . Headache   . Heart murmur, systolic 91/06/9149   Grade 1/6 ejection murmur per [facility] notes.   . Hyperlipemia   . Hypertension   . IBS (irritable bowel syndrome)   . Infertility   . Leiomyoma of uterus   . Morbid obesity due to excess calories ([place]) 11/16/2016  . Neuropathy   . Obesity   . Pancreatitis   . 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 Right-sided low back pain without sciatica 12/08/2015  . Secondary hyperthyroidism 11/27/2013   Followed by [facility], notes reviewed 01/01/15. PTH had improved with oral  vitamin D: 133 to 85. Last 155-probably not taking vit D.    [name] Upper airway cough syndrome 11/15/2016   rec off acei/ on pepcid hs 11/15/2016 >>>     Past Surgical History:  Procedure Laterality Date  . AV FISTULA PLACEMENT Left 05/09/2017   Procedure: INSERTION OF ARTERIOVENOUS (AV) GRAFT ARM (ARTEGRAFT);  Surgeon: Conrad Offerman, MD;  Location: [facility];  Service: Vascular;  Laterality: Left;  . BASCILIC VEIN TRANSPOSITION Left 08/31/2016   Procedure: BASILIC VEIN TRANSPOSITION FIRST STAGE;  Surgeon: Conrad [place], MD;  Location: [facility];  Service: Vascular;  Laterality: Left;  . FOOT SURGERY Right    t"ook bone out- maybe hammer toe"  . LIGATION OF ARTERIOVENOUS  FISTULA Left 05/09/2017   Procedure: LIGATION OF ARTERIOVENOUS  FISTULA;  Surgeon: Conrad Primrose, MD;  Location: [facility];  Service: Vascular;  Laterality: Left;  . LOOP RECORDER INSERTION N/A 12/05/2017   Procedure: LOOP RECORDER INSERTION;  Surgeon: Evans Lance, MD;  Location: [facility];  Service: Cardiovascular;  Laterality: N/A;  . MYOMECTOMY    . TEE WITHOUT CARDIOVERSION N/A 12/05/2017   Procedure: TRANSESOPHAGEAL ECHOCARDIOGRAM (TEE);  Surgeon: Sanda Klein, MD;  Location: [facility];  Service: Cardiovascular;  Laterality: N/A;  . VITRECTOMY Bilateral  There were no vitals filed for this visit.  Subjective Assessment - 01/02/18 1238    Subjective  No new complaints. No falls or pain to report today.    Pertinent History  CVA on 12/02/17, DM with peripheral neuropathy, fibromyalgia     Limitations  House hold activities;Standing    Patient Stated Goals  "I want to feel stable on my feet."      Currently in Pain?  No/denies    Pain Score  0-No pain         [facility] PT Assessment - 01/02/18 1239      6 Minute Walk- Baseline   6 Minute Walk- Baseline  yes    BP (mmHg)  150/88    HR (bpm)  86    02 Sat (%RA)  98 %    Modified Borg Scale for Dyspnea  0- Nothing at all    Perceived Rate of Exertion (Borg)   6-      6 Minute walk- Post Test   6 Minute Walk Post Test  yes    BP (mmHg)  152/88    HR (bpm)  88    02 Sat (%RA)  98 %    Modified Borg Scale for Dyspnea  1- Very mild shortness of breath    Perceived Rate of Exertion (Borg)  17- Very hard      6 minute walk test results    Aerobic Endurance Distance Walked  633    Endurance additional comments  1 seated rest break after ~2.5 minutes for ~2 mintues. no AD. right toe scuffing x 3 with pt self correcting      Standardized Balance Assessment   Standardized Balance Assessment  Dynamic Gait Index      Dynamic Gait Index   Level Surface  Mild Impairment    Change in Gait Speed  Mild Impairment    Gait with Horizontal Head Turns  Mild Impairment    Gait with Vertical Head Turns  Mild Impairment    Gait and Pivot Turn  Normal    Step Over Obstacle  Mild Impairment    Step Around Obstacles  Normal    Steps  Moderate Impairment    Total Score  17           [facility] Adult PT Treatment/Exercise - 01/02/18 1309      Transfers   Transfers  Sit to Stand;Stand to Sit    Sit to Stand  6: Modified independent (Device/Increase time)    Stand to Sit  6: Modified independent (Device/Increase time)      Ambulation/Gait   Ambulation/Gait  Yes    Ambulation/Gait Assistance  5: Supervision;4: Min guard    Ambulation/Gait Assistance Details  trialed foot up brace on right foot to assist with increased foot clearance with notable improvement. pt continues to have decreased DF on left as well, however did not scuff foot this session. Pt provided information on how to obtain foot up brace.     Ambulation Distance (Feet)  115 Feet    Assistive device  None    Gait Pattern  Step-through pattern;Decreased stride length;Decreased dorsiflexion - right;Decreased dorsiflexion - left;Decreased arm swing - left;Trunk flexed;Wide base of support;Poor foot clearance - right    Ambulation Surface  Level;Indoor         PT Education - 01/02/18 1346     Education provided  Yes    Education Details  results of 6 minute walk test, DGI and how to obtain  foot up brace    Person(s) Educated  Patient    Methods  Explanation;Demonstration;Verbal cues;Handout    Comprehension  Verbalized understanding;Returned demonstration       PT Short Term Goals - 01/02/18 1351      PT SHORT TERM GOAL #1   Title  Pt will initiate HEP in order to indicate improved functional mobility and decreased fall risk.  (Target Date: 01/20/17)    Time  3    Period  Weeks    Status  On-going      PT SHORT TERM GOAL #2   Title  Will assess DGI in order to better assess balance deficits and fall risk.      Baseline  01/02/18: 17/24 scored as baseline today    Status  Achieved      PT SHORT TERM GOAL #3   Title  Will assess 6MWT and will improve distance by 49' w/ LRAD in order to indicate improved endurance.      Baseline  01/02/18: baseline distance was 633 feet    Time  3    Period  Weeks    Status  On-going        PT Long Term Goals - 12/30/17 1957      PT LONG TERM GOAL #1   Title  Pt will be independent with final HEP in order to indicate improved functional mobilty and decreased fall risk.  (Target Date: 02/13/17)    Time  6    Period  Weeks    Status  New    Target Date  02/13/18      PT LONG TERM GOAL #2   Title  Pt will perform 5TSS in </= 20 secs without UE support in order to indicate decreased fall risk and improved functional strength.      Time  3    Period  Weeks    Status  New      PT LONG TERM GOAL #3   Title  Pt will improve gait speed to >/= 2.62 ft/sec w/ LRAD in order to indicate pt safe community ambulator.      Time  3    Period  Weeks    Status  New      PT LONG TERM GOAL #4   Title  Pt will score >19/24 on DGI in order to indicate decreased fall risk.      Time  6    Period  Weeks      PT LONG TERM GOAL #5   Title  Pt will improve 6MWT 150' from baseline with LRAD in order to indicate improved functional endurance.       Time  6    Period  Weeks    Status  New            Plan - 01/02/18 1239    Clinical Impression Statement  Today's skilled session established base line values for 6 minute walk test and dynamic gait index with pt falling in a fall risk category. Remainder of session addressed right toe scuffing with use of foot up brace. Pt liked how it felt and assisted with foot clearance, therefore provided pt with information on how to purchase one. Pt is progressing toward goals and should benefit from continued PT to progress toward unmet goals.     Rehab Potential  Good    Clinical Impairments Affecting Rehab Potential  pt motivated to return to normal function    PT Frequency  2x /  week    PT Duration  6 weeks    PT Treatment/Interventions  ADLs/Self Care Home Management;Electrical Stimulation;DME Instruction;Gait training;Stair training;Functional mobility training;Therapeutic activities;Therapeutic exercise;Balance training;Neuromuscular re-education;Patient/family education;Orthotic Fit/Training;Manual techniques;Passive range of motion;Energy conservation;Vestibular    PT Next Visit Plan  establish HEP for LE strengthening/balance/activity tolerance; further assess dizziness, gait for improved quality (she has rollator, but only uses for longer distances), balance    Consulted and Agree with Plan of Care  Patient       Patient will benefit from skilled therapeutic intervention in order to improve the following deficits and impairments:  Abnormal gait, Decreased activity tolerance, Decreased balance, Decreased endurance, Decreased knowledge of use of DME, Decreased mobility, Decreased strength, Dizziness, Impaired perceived functional ability, Impaired flexibility, Impaired sensation, Improper body mechanics  Visit Diagnosis: Hemiplegia and hemiparesis following cerebral infarction affecting left non-dominant side (HCC)  Other abnormalities of gait and mobility  Unsteadiness on feet  Muscle  weakness (generalized)     Problem List Patient Active Problem List   Diagnosis Date Noted  . 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 Vitamin D deficiency 01/02/2018  . Reactive depression 12/10/2017  . Left-sided weakness 12/10/2017  . Stroke (cerebrum) ([place]) 12/02/2017  . Morbid obesity due to excess calories ([place]) 11/16/2016  . Upper airway cough syndrome 11/15/2016  . DM (diabetes mellitus), type 2, uncontrolled, with renal complications ([place]) 60/45/4098  . Dyslipidemia associated with type 2 diabetes mellitus ([place]) 06/25/2016  . 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 Uncontrolled type 2 diabetes mellitus with diabetic polyneuropathy, with long-term current use of insulin ([place]) 03/08/2016  . Uncontrolled type 2 diabetes mellitus with stage 4 chronic kidney disease, with long-term current use of insulin ([place]) 03/08/2016  . Right-sided low back pain without sciatica 12/08/2015  . Proliferative diabetic retinopathy ([place]) 09/29/2015  . Pseudophakia of both eyes 05/30/2015  . 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 B12 deficiency 05/10/2014  . Anemia in chronic kidney disease 04/26/2014  . Chronic kidney disease, stage V ([place]) 04/26/2014  . Heart murmur, systolic 11/91/4782  . Secondary hyperparathyroidism ([place]) 11/27/2013  . Posterior subcapsular cataract, bilateral 10/18/2013  . Diabetic neuropathy, painful ([place]) 07/13/2013  . Diabetes mellitus, type II, insulin dependent ([place]) 02/19/2011  . Essential hypertension 02/19/2011  . Gastroparesis 02/19/2011    Willow Ora, PTA, [facility] [address] [place], [place] [address] [phone] 01/02/18, 1:56 PM   Name: Heather [name] MRN: [id number] Date of Birth: 05/15/1971

## 2018-01-03 ENCOUNTER — Other Ambulatory Visit: Payer: Self-pay | Admitting: Family Medicine

## 2018-01-03 DIAGNOSIS — K5904 Chronic idiopathic constipation: Secondary | ICD-10-CM

## 2018-01-04 ENCOUNTER — Ambulatory Visit (INDEPENDENT_AMBULATORY_CARE_PROVIDER_SITE_OTHER): Payer: Managed Care, Other (non HMO) | Admitting: *Deleted

## 2018-01-04 ENCOUNTER — Other Ambulatory Visit: Payer: Self-pay

## 2018-01-04 DIAGNOSIS — I639 Cerebral infarction, unspecified: Secondary | ICD-10-CM | POA: Diagnosis not present

## 2018-01-04 MED ORDER — SIMVASTATIN 10 MG PO TABS
10.0000 mg | ORAL_TABLET | Freq: Every day | ORAL | 0 refills | Status: DC
Start: 2018-01-04 — End: 2018-01-04

## 2018-01-04 MED ORDER — SIMVASTATIN 10 MG PO TABS: 5.0000 mg | ORAL_TABLET | Freq: Every day | ORAL | 0 refills | Status: DC

## 2018-01-05 ENCOUNTER — Encounter (HOSPITAL_BASED_OUTPATIENT_CLINIC_OR_DEPARTMENT_OTHER): Payer: Self-pay | Admitting: *Deleted

## 2018-01-05 ENCOUNTER — Emergency Department (HOSPITAL_BASED_OUTPATIENT_CLINIC_OR_DEPARTMENT_OTHER)
Admission: EM | Admit: 2018-01-05 | Discharge: 2018-01-06 | Disposition: A | Discharge status: Home / Self Care | Payer: Managed Care, Other (non HMO) | Attending: Emergency Medicine | Admitting: Emergency Medicine

## 2018-01-05 ENCOUNTER — Encounter (HOSPITAL_COMMUNITY): Payer: Managed Care, Other (non HMO)

## 2018-01-05 ENCOUNTER — Other Ambulatory Visit: Payer: Self-pay

## 2018-01-05 DIAGNOSIS — Z794 Long term (current) use of insulin: Secondary | ICD-10-CM | POA: Diagnosis not present

## 2018-01-05 DIAGNOSIS — E1122 Type 2 diabetes mellitus with diabetic chronic kidney disease: Secondary | ICD-10-CM | POA: Diagnosis not present

## 2018-01-05 DIAGNOSIS — R42 Dizziness and giddiness: Secondary | ICD-10-CM | POA: Insufficient documentation

## 2018-01-05 DIAGNOSIS — R112 Nausea with vomiting, unspecified: Secondary | ICD-10-CM | POA: Diagnosis not present

## 2018-01-05 DIAGNOSIS — I120 Hypertensive chronic kidney disease with stage 5 chronic kidney disease or end stage renal disease: Secondary | ICD-10-CM | POA: Diagnosis not present

## 2018-01-05 DIAGNOSIS — N186 End stage renal disease: Secondary | ICD-10-CM | POA: Diagnosis not present

## 2018-01-05 DIAGNOSIS — Z79899 Other long term (current) drug therapy: Secondary | ICD-10-CM | POA: Insufficient documentation

## 2018-01-05 MED ORDER — DIAZEPAM 5 MG/ML IJ SOLN
5.0000 mg | Freq: Once | INTRAMUSCULAR | Status: AC
  Administered 2018-01-05: 5 mg via INTRAMUSCULAR
  Filled 2018-01-05: qty 2

## 2018-01-05 MED ORDER — ONDANSETRON 8 MG PO TBDP
8.0000 mg | ORAL_TABLET | Freq: Once | ORAL | Status: AC
  Administered 2018-01-05: 8 mg via ORAL
  Filled 2018-01-05: qty 1

## 2018-01-05 NOTE — Progress Notes (Signed)
Remote pacemaker transmission.   

## 2018-01-05 NOTE — ED Triage Notes (Signed)
Dizziness and vomiting since last night. Worse with movement.

## 2018-01-05 NOTE — ED Provider Notes (Signed)
[facility] DEPT [facility] Provider Note: [name], MD, FACEP  CSN: [id number] MRN: [id number] ARRIVAL: 01/05/18 at [place]: [place]  Dizziness   HISTORY OF PRESENT ILLNESS  01/05/18 11:38 PM Heather [name] is a 47 y.o. female with a history of end-stage renal disease not yet on hemodialysis.  [name] here with a 1 day history of vertigo symptoms.  Specifically she has had dizziness, which she describes as the room spinning, along with nausea and vomiting.  This occurs when she stands or moves her head.  It is better with rest.  Symptoms are moderate to severe.  There is little associated abdominal pain and no diarrhea.  She denies tinnitus.  She denies a history of vertigo.  She still makes urine.   Past Medical History:  Diagnosis Date  . Allergy   . Anemia of chronic disease   . B12 deficiency 05/10/2014  . Blood transfusion without reported diagnosis   . Bronchitis   . Chronic constipation   . Chronic kidney disease   . Constipation   . Diabetic neuropathy, painful ([place]) 07/13/2013   Stop lyrica due to cost & SE of drowsy. Gabapentin helps Stay off hydrocodone: pt states she thinks "she was addicted" to med. "I don't want that anymore."   . DM2 (diabetes mellitus, type 2) ([place])   . Dyspnea    with exertion  . Fibromyalgia   . Gastroparesis due to DM   . GERD (gastroesophageal reflux disease)   . Headache   . Heart murmur, systolic 24/03/101   Grade 1/6 ejection murmur per [facility] notes.   . Hyperlipemia   . Hypertension   . IBS (irritable bowel syndrome)   . Infertility   . Leiomyoma of uterus   . Morbid obesity due to excess calories ([place]) 11/16/2016  . Neuropathy   . Obesity   . Pancreatitis   . 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 Right-sided low back pain without sciatica 12/08/2015  . Secondary hyperthyroidism 11/27/2013   Followed by [employer], notes reviewed 01/01/15. PTH had improved with oral vitamin  D: 133 to 85. Last 155-probably not taking vit D.    [name] Upper airway cough syndrome 11/15/2016   rec off acei/ on pepcid hs 11/15/2016 >>>     Past Surgical History:  Procedure Laterality Date  . AV FISTULA PLACEMENT Left 05/09/2017   Procedure: INSERTION OF ARTERIOVENOUS (AV) GRAFT ARM (ARTEGRAFT);  Surgeon: Conrad Springdale, MD;  Location: [facility];  Service: Vascular;  Laterality: Left;  . BASCILIC VEIN TRANSPOSITION Left 08/31/2016   Procedure: BASILIC VEIN TRANSPOSITION FIRST STAGE;  Surgeon: Conrad [place], MD;  Location: [facility];  Service: Vascular;  Laterality: Left;  . FOOT SURGERY Right    t"ook bone out- maybe hammer toe"  . LIGATION OF ARTERIOVENOUS  FISTULA Left 05/09/2017   Procedure: LIGATION OF ARTERIOVENOUS  FISTULA;  Surgeon: Conrad , MD;  Location: [facility];  Service: Vascular;  Laterality: Left;  . LOOP RECORDER INSERTION N/A 12/05/2017   Procedure: LOOP RECORDER INSERTION;  Surgeon: Evans Lance, MD;  Location: [facility];  Service: Cardiovascular;  Laterality: N/A;  . MYOMECTOMY    . TEE WITHOUT CARDIOVERSION N/A 12/05/2017   Procedure: TRANSESOPHAGEAL ECHOCARDIOGRAM (TEE);  Surgeon: Sanda Klein, MD;  [facility];  Service: Cardiovascular;  Laterality: N/A;  . VITRECTOMY Bilateral     Family History  Problem Relation Age of Onset  . Colon  polyps Mother   . Diabetes Mother   . Heart murmur Father   . Hypertension Father   . Diabetes Sister   . Kidney failure Sister   . Diabetes Brother   . Retinal degeneration Brother   . Heart murmur Son   . Stroke Paternal Grandmother   . Diabetes Sister     Social History   Tobacco Use  . Smoking status: Never Smoker  . Smokeless tobacco: Never Used  Substance Use Topics  . Alcohol use: No    Alcohol/week: 0.0 oz  . Drug use: No    Prior to Admission medications   Medication Sig Start Date End Date Taking? Authorizing Provider  calcitRIOL (ROCALTROL) 0.25 MCG capsule Take 0.5 mcg by mouth daily.      [provider]  cetirizine (ZYRTEC) 5 MG tablet 1/2 po daily prn allergic rhinitis 04/12/17   Briscoe Deutscher, DO  Continuous Blood Gluc Sensor ([place]) MISC by Does not apply route. 09/20/17   [provider]  epoetin alfa (EPOGEN,PROCRIT) 59563 UNIT/ML injection Inject 30,000 Units into the vein every 14 (fourteen) days.     [provider]  fluticasone (FLONASE) 50 MCG/ACT nasal spray Place 2 sprays daily into both nostrils. 11/01/17   Briscoe Deutscher, DO  fluticasone (FLOVENT HFA) 220 MCG/ACT inhaler Inhale 2 puffs into the lungs daily. 10/17/17   Briscoe Deutscher, DO  gabapentin (NEURONTIN) 300 MG capsule Take 1 capsule (300 mg total) by mouth 2 (two) times daily. 12/26/17   Briscoe Deutscher, DO  insulin aspart (NOVOLOG) 100 UNIT/ML FlexPen Inject under the skin 15 minutes before meals: 6 units before Breakfast, 8 units before Lunch, 8 units before Supper plus scale BG-150/25 up to 60 units daily 07/25/17   [provider]  insulin degludec (TRESIBA FLEXTOUCH) 100 UNIT/ML SOPN FlexTouch Pen Inject 28 Units into the skin daily with breakfast.     [provider]  LINZESS 145 MCG CAPS capsule TAKE 1 CAPSULE (145 MCG TOTAL) BY MOUTH DAILY BEFORE BREAKFAST. 01/03/18   Briscoe Deutscher, DO  sertraline (ZOLOFT) 25 MG tablet Take 1 tablet (25 mg total) by mouth daily. 01/02/18   Briscoe Deutscher, DO  simvastatin (ZOCOR) 10 MG tablet Take 0.5 tablets (5 mg total) by mouth at bedtime. 1/2 po q hs - GFR 14 01/04/18   Briscoe Deutscher, DO  torsemide (DEMADEX) 20 MG tablet Take 1 tablet (20 mg total) by mouth 2 (two) times daily. 01/02/18   Briscoe Deutscher, DO    Allergies Ibuprofen and Dilaudid [hydromorphone hcl]   REVIEW OF SYSTEMS  Negative except as noted here or in the History of Present Illness.   PHYSICAL EXAMINATION  Initial Vital Signs Blood pressure (!) 211/108, pulse 84, temperature 97.8 F (36.6 C), temperature source Oral, resp. rate  18, height 5\' 9"  (1.753 m), weight 108.4 kg (239 lb), last menstrual period 12/26/2017, SpO2 99 %.  Examination General: Well-developed, well-nourished female in no acute distress; appearance consistent with age of record HENT: normocephalic; atraumatic Eyes: pupils equal, round and reactive to light; extraocular muscles intact; no nystagmus Neck: supple Heart: regular rate and rhythm Lungs: clear to auscultation bilaterally Abdomen: soft; nondistended; nontender; bowel sounds present Extremities: No deformity; full range of motion; pulses normal; trace edema of lower legs Neurologic: Awake, alert and oriented; motor function intact in all extremities and symmetric; no facial droop; negative Romberg; normal finger to nose Skin: Warm and dry Psychiatric: Flat affect   RESULTS  Summary of this visit's  results, reviewed by myself:   EKG Interpretation  Date/Time:    Ventricular Rate:    PR Interval:    QRS Duration:   QT Interval:    QTC Calculation:   R Axis:     Text Interpretation:        Laboratory Studies: No results found for this or any previous visit (from the past 24 hour(s)). Imaging Studies: No results found.  ED COURSE  Nursing notes and initial vitals signs, including pulse oximetry, reviewed.  Vitals:   01/05/18 2002 01/05/18 2217 01/06/18 0036 01/06/18 0037  BP: (!) 205/106 (!) 211/108 (!) 174/110 (!) 174/110  Pulse: 84 84 84 84  Resp: 18 18 16    Temp: 98.2 F (36.8 C) 97.8 F (36.6 C)    TempSrc: Oral Oral    SpO2: 98% 99% 98% 95%  Weight:      Height:       1:14 AM Nausea improved and patient able to drink fluids without emesis.  Vertigo controlled while in bed the patient still not able to ambulate asymptomatically.  At this time she would prefer to go home to her own bed.  We will provide prescriptions for medications to control her symptoms.  PROCEDURES    ED DIAGNOSES     ICD-10-CM   1. Vertigo R42        Elinore Shults, MD 01/06/18  [phone]

## 2018-01-06 ENCOUNTER — Ambulatory Visit: Payer: Managed Care, Other (non HMO) | Admitting: Rehabilitation

## 2018-01-06 ENCOUNTER — Other Ambulatory Visit: Payer: Self-pay

## 2018-01-06 DIAGNOSIS — Z01818 Encounter for other preprocedural examination: Secondary | ICD-10-CM

## 2018-01-06 DIAGNOSIS — N185 Chronic kidney disease, stage 5: Secondary | ICD-10-CM

## 2018-01-06 MED ORDER — DIAZEPAM 5 MG PO TABS: 5.0000 mg | ORAL_TABLET | Freq: Three times a day (TID) | ORAL | 0 refills | Status: DC | PRN

## 2018-01-06 MED ORDER — ONDANSETRON 8 MG PO TBDP: 8.0000 mg | ORAL_TABLET | Freq: Three times a day (TID) | ORAL | 0 refills | Status: DC | PRN

## 2018-01-06 NOTE — ED Notes (Signed)
Attempted to ambulated pt to determine if pt is still dizzy. Pt reported decrease in dizziness upon sitting up but was swaying back and forth when standing and reports "feeling like I am going to fall down" when ambulating to doorway. Pt assisted back into bed. Pt denies nausea. Pt in NAD at this time. Family at bedside.

## 2018-01-06 NOTE — ED Notes (Signed)
Pt given water for PO challenge 

## 2018-01-06 NOTE — ED Notes (Signed)
ED Provider at bedside. 

## 2018-01-10 ENCOUNTER — Ambulatory Visit
Admission: RE | Admit: 2018-01-10 | Discharge: 2018-01-10 | Disposition: A | Discharge status: Home / Self Care | Payer: Managed Care, Other (non HMO) | Source: Ambulatory Visit | Attending: Family Medicine | Admitting: Family Medicine

## 2018-01-10 DIAGNOSIS — R198 Other specified symptoms and signs involving the digestive system and abdomen: Secondary | ICD-10-CM

## 2018-01-11 ENCOUNTER — Ambulatory Visit: Payer: Managed Care, Other (non HMO) | Admitting: Physical Therapy

## 2018-01-11 ENCOUNTER — Encounter: Payer: Self-pay | Admitting: Physical Therapy

## 2018-01-11 DIAGNOSIS — R2681 Unsteadiness on feet: Secondary | ICD-10-CM

## 2018-01-11 DIAGNOSIS — I69354 Hemiplegia and hemiparesis following cerebral infarction affecting left non-dominant side: Secondary | ICD-10-CM | POA: Diagnosis not present

## 2018-01-11 NOTE — Therapy (Addendum)
[facility] [address] [place], [place], [address] Phone: [phone]   Fax:  [phone]  Physical Therapy Treatment  Patient Details  Name: Heather [name] MRN: [id number] Date of Birth: 1971/02/21 Referring Provider: Briscoe Deutscher, DO   Encounter Date: 01/11/2018  PT End of Session - 01/11/18 1321    Visit Number  3    Number of Visits  13    Date for PT Re-Evaluation  02/13/18    Authorization Type  [employer]     PT Start Time  1145    PT Stop Time  1239    PT Time Calculation (min)  54 min    Activity Tolerance  Patient tolerated treatment well    Behavior During Therapy  [facility] for tasks assessed/performed       Past Medical History:  Diagnosis Date  . Allergy   . Anemia of chronic disease   . B12 deficiency 05/10/2014  . Blood transfusion without reported diagnosis   . Bronchitis   . Chronic constipation   . Chronic kidney disease   . Constipation   . CVA (cerebral vascular accident) ([place]) 12/03/2017   nonhemorrhagic infarct-- inferior right cerebellum  . Diabetic neuropathy, painful ([place]) 07/13/2013   Stop lyrica due to cost & SE of drowsy. Gabapentin helps Stay off hydrocodone: pt states she thinks "she was addicted" to med. "I don't want that anymore."   . DM2 (diabetes mellitus, type 2) ([place])   . Dyspnea    with exertion  . Fibromyalgia   . Gastroparesis due to DM   . GERD (gastroesophageal reflux disease)   . Headache   . Heart murmur, systolic 25/05/3892   Grade 1/6 ejection murmur per [facility] notes.   . Hyperlipemia   . Hypertension   . IBS (irritable bowel syndrome)   . Infertility   . Leiomyoma of uterus   . Morbid obesity due to excess calories ([place]) 11/16/2016  . Neuropathy   . Obesity   . Pancreatitis   . 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 Right-sided low back pain without sciatica 12/08/2015  . Secondary hyperthyroidism 11/27/2013   Followed by [employer], notes reviewed 01/01/15. PTH had improved with oral vitamin D: 133 to 85. Last 155-probably not taking vit D.    [name] Upper airway cough syndrome 11/15/2016   rec off acei/ on pepcid hs 11/15/2016 >>>     Past Surgical History:  Procedure Laterality Date  . AV FISTULA PLACEMENT Left 05/09/2017   Procedure: INSERTION OF ARTERIOVENOUS (AV) GRAFT ARM (ARTEGRAFT);  Surgeon: Conrad Quantico, MD;  Location: [facility];  Service: Vascular;  Laterality: Left;  . BASCILIC VEIN TRANSPOSITION Left 08/31/2016   Procedure: BASILIC VEIN TRANSPOSITION FIRST STAGE;  Surgeon: Conrad Marie, MD;  Location: [facility];  Service: Vascular;  Laterality: Left;  . FOOT SURGERY Right    t"ook bone out- maybe hammer toe"  . LIGATION OF ARTERIOVENOUS  FISTULA Left 05/09/2017   Procedure: LIGATION OF ARTERIOVENOUS  FISTULA;  Surgeon: Conrad Huntland, MD;  Location: [facility];  Service: Vascular;  Laterality: Left;  . LOOP RECORDER INSERTION N/A 12/05/2017   Procedure: LOOP RECORDER INSERTION;  Surgeon: Evans Lance, MD;  Location: [facility];  Service: Cardiovascular;  Laterality: N/A;  . MYOMECTOMY    . TEE WITHOUT CARDIOVERSION N/A 12/05/2017   Procedure: TRANSESOPHAGEAL ECHOCARDIOGRAM (TEE);  Surgeon: Sanda Klein, MD;  Location: [facility];  Service: Cardiovascular;  Laterality:  N/A;  . VITRECTOMY Bilateral     There were no vitals filed for this visit.  Subjective Assessment - 01/11/18 1151    Subjective  Had new onset of vertigo 01/05/18. Went to ED and they gave her Zophran and Valium. [name] not taking either. Woke up 1/10 at 11:30 at night to go to bathroom, fell to floor due to dizziness. No injury from fall. Dizziness occurs with head movements and when sitting up.     Pertinent History  CVA on 12/02/17, DM with peripheral neuropathy, fibromyalgia     Limitations  House hold activities;Standing    Patient Stated Goals  "I want to feel stable on my feet."      Currently in Pain?  No/denies    Pain Score   0-No pain             Vestibular Assessment - 01/11/18 1159      Vestibular Assessment   General Observation  Asked to assess pt for BPPV by Willow Ora, PTA (pt had been seen in ED 01/05/18 for sudden onset spinning when she awakened and was released with prescription for zofran and valium). Pt reports she has brief time of spinning (sometimes her, sometimes the environment) with rolling, turning her head, supine to sit.       Symptom Behavior   Type of Dizziness  Spinning    Frequency of Dizziness  daily    Duration of Dizziness  seconds    Aggravating Factors  Supine to sit;Turning head sideways;Turning head quickly;Sit to stand;Rolling to right;Rolling to left    Relieving Factors  Rest;Closing eyes      Occulomotor Exam   Occulomotor Alignment  Normal    Spontaneous  Absent    Gaze-induced  Absent    Smooth Pursuits  Intact    Saccades  Poor trajectory looking from her left to her right, rt eye>left "hops"     Comment  noted double-vision with pen <=20 inches from her nose (if >20 inches, no double-vision); left eye slow to adduct as testing conversion      Positional Testing   Dix-Hallpike  Dix-Hallpike Right;Dix-Hallpike Left    Horizontal Canal Testing  Horizontal Canal Left Intensity;Horizontal Canal Right Intensity      Dix-Hallpike Right   Dix-Hallpike Right Duration  <3 sec    Dix-Hallpike Right Symptoms  No nystagmus;Other (comment) spinning for a few seconds      Dix-Hallpike Left   Dix-Hallpike Left Duration  <3 sec    Dix-Hallpike Left Symptoms  No nystagmus;Other (comment) spinning for a few seconds      Horizontal Canal Right Intensity   Horizontal Canal Right Intensity  -- 0      Horizontal Canal Left Intensity   Horizontal Canal Left Intensity  -- 0              [facility] Adult PT Treatment/Exercise - 01/11/18 1306      Transfers   Transfers  Sit to Stand;Stand to Sit    Sit to Stand  4: Min guard    Stand to Sit  5: Supervision    Comments   after vestibular assessment, feeling more unsteady      Ambulation/Gait   Ambulation/Gait  Yes    Ambulation/Gait Assistance  4: Min guard    Ambulation/Gait Assistance Details  wide BOS with slight stagger/drift to her left   Ambulation Distance (Feet)  100 Feet    Assistive device  None    Gait Pattern  Step-through pattern;Decreased stride length;Decreased dorsiflexion - right;Decreased dorsiflexion - left;Decreased arm swing - left;Trunk flexed;Wide base of support;Poor foot clearance - right    Ambulation Surface  Indoor    Gait Comments  Reports she left her rollator in the car as she felt OK walking into building (unsteady, but not unsafe); states she felt more unsteady/off balance when walking from lobby to the gym      Self-Care   Self-Care  Other Self-Care Comments    Other Self-Care Comments   After vestibular assessment for BPPV and equivocal results, further assessed cerebellar function and oculomotor function. Patient reports decreased coordination LLE with heel to shin no worse than previously seen. Does not recall having double-vision issues (and was unaware until specifically tested convergence). With further questioning, she reports she has been "bouncing off the walls" because of poor balance since the episode of vertigo. Reports her balance significantly declined when woke up with vertigo compared to after her recent Lt cerebellar CVA. Discussed concern for new stroke when further questioned and discovered double-vision, dysmetria with saccades, and pt's description of significant change in her balance. Discussed with pt that she is not currently on a blood thinner, and this may be appropriate treatment if she is found to have had another CVA. The only way to know this is to have a brain scan and compare to more recent scans.        Vestibular Treatment/Exercise - 01/11/18 0001      Vestibular Treatment/Exercise   Vestibular Treatment Provided  Canalith Repositioning     Canalith Repositioning  Epley Manuever Right;Epley Manuever Left       EPLEY MANUEVER RIGHT   Number of Reps   2    Overall Response  Improved Symptoms    Response Details   less spinning with second rep; improved ability to quickly turn her head left/right       EPLEY MANUEVER LEFT   Number of Reps   1    Overall Response   Improved Symptoms     RESPONSE DETAILS LEFT   improved ability to quickly turn her head left/right            PT Education - 01/11/18 1319    Education provided  Yes    Education Details  conflicting symptoms for inner ear cause of increased symptoms with concern for ?second CVA; recommend return to ED for workup (seen there 01/05/18); no driving (pt calling her husband to pick her up)    Person(s) Educated  Patient    Methods  Explanation    Comprehension  Verbalized understanding       PT Short Term Goals - 01/02/18 1351      PT SHORT TERM GOAL #1   Title  Pt will initiate HEP in order to indicate improved functional mobility and decreased fall risk.  (Target Date: 01/20/17)    Time  3    Period  Weeks    Status  On-going      PT SHORT TERM GOAL #2   Title  Will assess DGI in order to better assess balance deficits and fall risk.      Baseline  01/02/18: 17/24 scored as baseline today    Status  Achieved      PT SHORT TERM GOAL #3   Title  Will assess 6MWT and will improve distance by 65' w/ LRAD in order to indicate improved endurance.      Baseline  01/02/18: baseline distance was 633 feet  Time  3    Period  Weeks    Status  On-going        PT Long Term Goals - 12/30/17 1957      PT LONG TERM GOAL #1   Title  Pt will be independent with final HEP in order to indicate improved functional mobilty and decreased fall risk.  (Target Date: 02/13/17)    Time  6    Period  Weeks    Status  New    Target Date  02/13/18      PT LONG TERM GOAL #2   Title  Pt will perform 5TSS in </= 20 secs without UE support in order to indicate decreased fall  risk and improved functional strength.      Time  3    Period  Weeks    Status  New      PT LONG TERM GOAL #3   Title  Pt will improve gait speed to >/= 2.62 ft/sec w/ LRAD in order to indicate pt safe community ambulator.      Time  3    Period  Weeks    Status  New      PT LONG TERM GOAL #4   Title  Pt will score >19/24 on DGI in order to indicate decreased fall risk.      Time  6    Period  Weeks      PT LONG TERM GOAL #5   Title  Pt will improve 6MWT 150' from baseline with LRAD in order to indicate improved functional endurance.      Time  6    Period  Weeks    Status  New            Plan - 01/11/18 1322    Clinical Impression Statement  Session initiated by Willow Ora, PTA however she requested assessment by PT due to patient's 01/05/18 visit to ED due to sudden onset vertigo. See assessments above. Based on patient's initial description of her dizziness, sounded probable that she had BPPV. However with Marye Round there was no nystagmus and only very brief sensation of spinning dizziness. Further assessment for cental causes of vertigo with some + results (double-vision, dysmetria with saccades). Patient to calll her husband to pick her up and plans to go to the ED. Did not feel it warranted calling an ambulance for transport as pt has been having symptoms since 01/05/18 when seen in the ED and symptoms have not worsened since initial onset 1/10. Explained to pt my concern that she is not currently on a blood thinner or high blood pressure medicine.     Rehab Potential  Good    Clinical Impairments Affecting Rehab Potential  pt motivated to return to normal function    PT Frequency  2x / week    PT Duration  6 weeks    PT Treatment/Interventions  ADLs/Self Care Home Management;Electrical Stimulation;DME Instruction;Gait training;Stair training;Functional mobility training;Therapeutic activities;Therapeutic exercise;Balance training;Neuromuscular re-education;Patient/family  education;Orthotic Fit/Training;Manual techniques;Passive range of motion;Energy conservation;Vestibular    PT Next Visit Plan  check record if pt returned to ED 1/16 or ? got relief from Dix-Hallpike(s) CHECK BP; establish HEP for LE strengthening/balance/activity tolerance; further assess dizziness, gait for improved quality (she has rollator, but only uses for longer distances), balance    Consulted and Agree with Plan of Care  Patient       Patient will benefit from skilled therapeutic intervention in order to improve the following deficits  and impairments:  Abnormal gait, Decreased activity tolerance, Decreased balance, Decreased endurance, Decreased knowledge of use of DME, Decreased mobility, Decreased strength, Dizziness, Impaired perceived functional ability, Impaired flexibility, Impaired sensation, Improper body mechanics  Visit Diagnosis: Unsteadiness on feet     Problem List Patient Active Problem List   Diagnosis Date Noted  . 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 Vitamin D deficiency 01/02/2018  . Bilateral lower extremity edema 01/02/2018  . Reactive depression 12/10/2017  . Left-sided weakness 12/10/2017  . Stroke (cerebrum) ([place]) 12/02/2017  . Morbid obesity due to excess calories ([place]) 11/16/2016  . Upper airway cough syndrome 11/15/2016  . DM (diabetes mellitus), type 2, uncontrolled, with renal complications ([place]) 95/08/3266  . Dyslipidemia associated with type 2 diabetes mellitus ([place]) 06/25/2016  . Uncontrolled type 2 diabetes mellitus with both eyes affected by proliferative retinopathy and macular edema, with long-term current use of insulin ([place]) 06/22/2016  . Uncontrolled type 2 diabetes mellitus with diabetic polyneuropathy, with long-term current use of insulin ([place]) 03/08/2016  . Uncontrolled type 2 diabetes mellitus with stage 4 chronic kidney disease, with long-term current use of insulin ([place]) 03/08/2016  . Right-sided low back pain without sciatica  12/08/2015  . Proliferative diabetic retinopathy ([place]) 09/29/2015  . Pseudophakia of both eyes 05/30/2015  . 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 B12 deficiency 05/10/2014  . Anemia in chronic kidney disease 04/26/2014  . Chronic kidney disease, stage V ([place]) 04/26/2014  . Heart murmur, systolic 12/45/8099  . Secondary hyperparathyroidism ([place]) 11/27/2013  . Posterior subcapsular cataract, bilateral 10/18/2013  . Diabetic neuropathy, painful ([place]) 07/13/2013  . Diabetes mellitus, type II, insulin dependent ([place]) 02/19/2011  . Essential hypertension 02/19/2011  . Gastroparesis 02/19/2011    Rexanne Mano, PT 01/11/2018, 5:35 PM  [facility] [address] [place], [place], [address] Phone: [phone]   Fax:  [phone]  Name: Heather [name] MRN: [id number] Date of Birth: [birth year]

## 2018-01-12 ENCOUNTER — Other Ambulatory Visit: Payer: Self-pay

## 2018-01-12 ENCOUNTER — Ambulatory Visit (INDEPENDENT_AMBULATORY_CARE_PROVIDER_SITE_OTHER): Payer: Managed Care, Other (non HMO) | Admitting: Family Medicine

## 2018-01-12 ENCOUNTER — Encounter: Payer: Self-pay | Admitting: Family Medicine

## 2018-01-12 ENCOUNTER — Ambulatory Visit (HOSPITAL_COMMUNITY)
Admission: RE | Admit: 2018-01-12 | Discharge: 2018-01-12 | Disposition: A | Discharge status: Home / Self Care | Payer: Managed Care, Other (non HMO) | Source: Ambulatory Visit | Attending: Family Medicine | Admitting: Family Medicine

## 2018-01-12 VITALS — BP 178/94 | HR 97 | Temp 97.8°F | Ht 69.0 in | Wt 238.0 lb

## 2018-01-12 DIAGNOSIS — E119 Type 2 diabetes mellitus without complications: Secondary | ICD-10-CM

## 2018-01-12 DIAGNOSIS — R600 Localized edema: Secondary | ICD-10-CM

## 2018-01-12 DIAGNOSIS — R42 Dizziness and giddiness: Secondary | ICD-10-CM

## 2018-01-12 DIAGNOSIS — I6782 Cerebral ischemia: Secondary | ICD-10-CM | POA: Insufficient documentation

## 2018-01-12 DIAGNOSIS — N920 Excessive and frequent menstruation with regular cycle: Secondary | ICD-10-CM | POA: Insufficient documentation

## 2018-01-12 DIAGNOSIS — R531 Weakness: Secondary | ICD-10-CM

## 2018-01-12 DIAGNOSIS — N185 Chronic kidney disease, stage 5: Secondary | ICD-10-CM | POA: Diagnosis not present

## 2018-01-12 DIAGNOSIS — I639 Cerebral infarction, unspecified: Secondary | ICD-10-CM | POA: Diagnosis not present

## 2018-01-12 DIAGNOSIS — N946 Dysmenorrhea, unspecified: Secondary | ICD-10-CM | POA: Insufficient documentation

## 2018-01-12 DIAGNOSIS — G4700 Insomnia, unspecified: Secondary | ICD-10-CM | POA: Insufficient documentation

## 2018-01-12 DIAGNOSIS — I6522 Occlusion and stenosis of left carotid artery: Secondary | ICD-10-CM | POA: Insufficient documentation

## 2018-01-12 DIAGNOSIS — I10 Essential (primary) hypertension: Secondary | ICD-10-CM | POA: Diagnosis not present

## 2018-01-12 DIAGNOSIS — Z794 Long term (current) use of insulin: Secondary | ICD-10-CM

## 2018-01-12 DIAGNOSIS — N83202 Unspecified ovarian cyst, left side: Secondary | ICD-10-CM

## 2018-01-12 HISTORY — DX: Unspecified ovarian cyst, left side: N83.202

## 2018-01-12 MED ORDER — ASPIRIN EC 81 MG PO TBEC: 81.0000 mg | DELAYED_RELEASE_TABLET | Freq: Every day | ORAL | 0 refills | Status: DC

## 2018-01-12 MED ORDER — CLOPIDOGREL BISULFATE 75 MG PO TABS: 75.0000 mg | ORAL_TABLET | Freq: Every day | ORAL | 3 refills | Status: DC

## 2018-01-12 MED ORDER — TORSEMIDE 20 MG PO TABS: 20.0000 mg | ORAL_TABLET | Freq: Two times a day (BID) | ORAL | 3 refills | Status: DC

## 2018-01-12 MED ORDER — ONDANSETRON HCL 4 MG PO TABS: 4.0000 mg | ORAL_TABLET | Freq: Three times a day (TID) | ORAL | 0 refills | Status: DC | PRN

## 2018-01-12 NOTE — Progress Notes (Signed)
To Do.

## 2018-01-12 NOTE — Patient Instructions (Addendum)
Follow up with Garvin Fila, MD (Neurology)  Follow up with Evans Lance, MD (Cardiology)

## 2018-01-12 NOTE — Progress Notes (Signed)
Heather [name] is a 47 y.o. female is here for follow up.  History of Present Illness:   HPI:   1. Cerebrovascular accident (CVA), unspecified mechanism ([place]).  Please see discharge summary of hospital stay.  Patient has not had follow-up with neurology or cardiology.  [name] not on Plavix or any antiplatelet medication for secondary prevention.   2. Left-sided weakness.  No change.  The patient had a CVA as above.  She continues to work with neuro rehab.  She has no improvement and reports dizziness as below.   3. Bilateral lower extremity edema.  Improved with torsemide.  This was okayed by her nephrologist after he started treatment.   4. Chronic kidney disease, stage V ([place]).  Followed by nephrology.  Now anticipating dialysis in the near future.   5. Diabetes mellitus, type II, insulin dependent ([place]).   Taking medication compliantly without noted sided effects [x]   YES  []   NO  Episodes of hypoglycemia? []   YES  [x]   NO  Lab Results  Component Value Date   HGBA1C 6.4 (H) 12/03/2017    Lab Results  Component Value Date   MICROALBUR 66.1 (H) 04/07/2015      Lab Results  Component Value Date   CREATININE 4.09 (H) 01/02/2018     6. Severe dizziness.  The patient was seen in the ER last week and diagnosed with vertigo.  She was given Valium for treatment which caused severe dizziness and imbalance issues.  Her blood pressure has been labile.  She has not had follow-up with neurology or cardiology since her hospital stay.  She has not had imaging since her hospital stay.  She has not been on antiplatelet for secondary stroke prevention.   7. Essential hypertension.   Avoiding excessive salt intake? [x]   YES  []   NO Trying to exercise on a regular basis? []   YES  [x]   NO Review: taking medications as instructed, no medication side effects noted, possible neurological symptoms: dizziness, headaches, gait imbalance, no swelling of ankles.  Smoker: No.  Wt Readings from Last 3  Encounters:  01/12/18 238 lb (108 kg)  01/05/18 239 lb (108.4 kg)  01/02/18 239 lb 12.8 oz (108.8 kg)   BP Readings from Last 3 Encounters:  01/12/18 (!) 178/94  01/06/18 (!) 174/110  01/02/18 140/80       Health Maintenance Due  Topic Date Due  . URINE MICROALBUMIN  04/06/2016  . OPHTHALMOLOGY EXAM  08/05/2017   Depression screen [facility] 2/9 01/02/2018 12/08/2017 06/30/2017  Decreased Interest 1 1 0  Down, Depressed, Hopeless 0 3 0  PHQ - 2 Score 1 4 0  Altered sleeping 3 3 -  Tired, decreased energy 3 2 -  Change in appetite 1 3 -  Feeling bad or failure about yourself  0 2 -  Trouble concentrating 0 1 -  Moving slowly or fidgety/restless 1 1 -  Suicidal thoughts 0 1 -  PHQ-9 Score 9 17 -  Some recent data might be hidden   PMHx, SurgHx, SocialHx, FamHx, Medications, and Allergies were reviewed in the Visit Navigator and updated as appropriate.   Patient Active Problem List   Diagnosis Date Noted  . Cyst of left ovary 01/12/2018  . Dysmenorrhea 01/12/2018  . Insomnia 01/12/2018  . Menorrhagia 01/12/2018  . 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 Vitamin D deficiency 01/02/2018  . Bilateral lower extremity edema 01/02/2018  . Reactive depression 12/10/2017  . Left-sided weakness 12/10/2017  .  Stroke (cerebrum) ([place]) 12/02/2017  . Dysfunction of left eustachian tube 11/24/2017  . Arthralgia of right temporomandibular joint 10/24/2017  . Morbid obesity due to excess calories ([place]) 11/16/2016  . Upper airway cough syndrome 11/15/2016  . DM (diabetes mellitus), type 2, uncontrolled, with renal complications ([place]) 62/13/0865  . Dyslipidemia associated with type 2 diabetes mellitus ([place]) 06/25/2016  . Uncontrolled type 2 diabetes mellitus with both eyes affected by proliferative retinopathy and macular edema, with long-term current use of insulin ([place]) 06/22/2016  . Uncontrolled type 2 diabetes mellitus with diabetic polyneuropathy, with long-term current use of insulin ([place])  03/08/2016  . Uncontrolled type 2 diabetes mellitus with stage 4 chronic kidney disease, with long-term current use of insulin ([place]) 03/08/2016  . Right-sided low back pain without sciatica 12/08/2015  . Proliferative diabetic retinopathy ([place]) 09/29/2015  . Pseudophakia of both eyes 05/30/2015  . 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 B12 deficiency 05/10/2014  . Anemia in chronic kidney disease 04/26/2014  . Chronic kidney disease, stage V ([place]) 04/26/2014  . Heart murmur, systolic 78/46/9629  . Secondary hyperparathyroidism ([place]) 11/27/2013  . Posterior subcapsular cataract, bilateral 10/18/2013  . Diabetic neuropathy, painful ([place]) 07/13/2013  . Diabetes mellitus, type II, insulin dependent ([place]) 02/19/2011  . Essential hypertension 02/19/2011  . Gastroparesis 02/19/2011   Social History   Tobacco Use  . Smoking status: Never Smoker  . Smokeless tobacco: Never Used  Substance Use Topics  . Alcohol use: No    Alcohol/week: 0.0 oz  . Drug use: No   Current Medications and Allergies:   .  calcitRIOL (ROCALTROL) 0.25 MCG capsule, Take 0.5 mcg by mouth daily. , Disp: , Rfl:  .  cetirizine (ZYRTEC) 5 MG tablet, 1/2 po daily prn allergic rhinitis, Disp: 24 tablet, Rfl: 0 .  Continuous Blood Gluc Sensor ([place]) MISC, by Does not apply route., Disp: , Rfl:  .  diazepam (VALIUM) 5 MG tablet, Take 1 tablet (5 mg total) by mouth every 8 (eight) hours as needed (for vertigo)., Disp: 12 tablet, Rfl: 0 .  epoetin alfa (EPOGEN,PROCRIT) 52841 UNIT/ML injection, Inject 30,000 Units into the vein every 14 (fourteen) days. , Disp: , Rfl:  .  fluticasone (FLONASE) 50 MCG/ACT nasal spray, Place 2 sprays daily into both nostrils., Disp: 48 g, Rfl: 1 .  fluticasone (FLOVENT HFA) 220 MCG/ACT inhaler, Inhale 2 puffs into the lungs daily., Disp: 1 Inhaler, Rfl: 12 .  gabapentin (NEURONTIN) 300 MG capsule, Take 1 capsule (300 mg total) by mouth 2 (two) times daily., Disp:  180 capsule, Rfl: 1 .  insulin aspart (NOVOLOG) 100 UNIT/ML FlexPen, Inject under the skin 15 minutes before meals: 6 units before Breakfast, 8 units before Lunch, 8 units before Supper plus scale BG-150/25 up to 60 units daily, Disp: , Rfl:  .  insulin degludec (TRESIBA FLEXTOUCH) 100 UNIT/ML SOPN FlexTouch Pen, Inject 28 Units into the skin daily with breakfast. , Disp: , Rfl:  .  LINZESS 145 MCG CAPS capsule, TAKE 1 CAPSULE (145 MCG TOTAL) BY MOUTH DAILY BEFORE BREAKFAST., Disp: 30 capsule, Rfl: 5 .  ondansetron (ZOFRAN ODT) 8 MG disintegrating tablet, Take 1 tablet (8 mg total) by mouth every 8 (eight) hours as needed for nausea or vomiting., Disp: 10 tablet, Rfl: 0 .  sertraline (ZOLOFT) 25 MG tablet, Take 1 tablet (25 mg total) by mouth daily., Disp: 90 tablet, Rfl: 0 .  simvastatin (ZOCOR) 10 MG tablet, Take 0.5 tablets (5 mg total) by mouth  at bedtime. 1/2 po q hs - GFR 14, Disp: 30 tablet, Rfl: 0 .  ondansetron (ZOFRAN) 4 MG tablet, Take 1 tablet (4 mg total) by mouth every 8 (eight) hours as needed for nausea or vomiting., Disp: 20 tablet, Rfl: 0 .  torsemide (DEMADEX) 20 MG tablet, Take 1 tablet (20 mg total) by mouth 2 (two) times daily., Disp: 180 tablet, Rfl: 3  Allergies  Allergen Reactions  . Ibuprofen Other (See Comments)    CKD stage 3. Should avoid.  . Dilaudid [Hydromorphone Hcl] Itching and Other (See Comments)    Can take with Benadryl.   Review of Systems   Pertinent items are noted in the HPI. Otherwise, ROS is negative.  Vitals:   Vitals:   01/12/18 0716  BP: (!) 178/94  Pulse: 97  Temp: 97.8 F (36.6 C)  TempSrc: Oral  SpO2: 98%  Weight: 238 lb (108 kg)  Height: 5\' 9"  (1.753 m)     Body mass index is 35.15 kg/m.   Physical Exam:   Physical Exam  Constitutional: [name] oriented to person, place, and time. She appears well-developed and well-nourished. No distress.  HENT:  Head: Normocephalic and atraumatic.  Right Ear: External ear normal.  Left  Ear: External ear normal.  Nose: Nose normal.  Mouth/Throat: Oropharynx is clear and moist.  Eyes: Conjunctivae and EOM are normal. Pupils are equal, round, and reactive to light.  Neck: Normal range of motion. Neck supple. No thyromegaly present.  Cardiovascular: Normal rate, regular rhythm and intact distal pulses.  Pulmonary/Chest: Effort normal and breath sounds normal.  Abdominal: Soft. Bowel sounds are normal.  Musculoskeletal: Normal range of motion.  Neurological: [name] alert and oriented to person, place, and time. A sensory deficit is present. No cranial nerve deficit. She exhibits abnormal muscle tone. Gait abnormal.  Skin: Skin is warm.  Psychiatric: She has a normal mood and affect. Her behavior is normal.  Nursing note and vitals reviewed.   Results for orders placed or performed in visit on 01/02/18  Comprehensive metabolic panel  Result Value Ref Range   Sodium 136 135 - 145 mEq/L   Potassium 4.3 3.5 - 5.1 mEq/L   Chloride 105 96 - 112 mEq/L   CO2 24 19 - 32 mEq/L   Glucose, Bld 184 (H) 70 - 99 mg/dL   BUN 29 (H) 6 - 23 mg/dL   Creatinine, Ser 4.09 (H) 0.40 - 1.20 mg/dL   Total Bilirubin 0.4 0.2 - 1.2 mg/dL   Alkaline Phosphatase 72 39 - 117 U/L   AST 11 0 - 37 U/L   ALT 13 0 - 35 U/L   Total Protein 5.8 (L) 6.0 - 8.3 g/dL   Albumin 2.9 (L) 3.5 - 5.2 g/dL   Calcium 8.1 (L) 8.4 - 10.5 mg/dL   GFR 14.97 (LL) >60.00 mL/min  Vitamin B12  Result Value Ref Range   Vitamin B-12 368 211 - 911 pg/mL  Brain natriuretic peptide  Result Value Ref Range   Pro B Natriuretic peptide (BNP) 32.0 0.0 - 100.0 pg/mL   Assessment and Plan:   1. Cerebrovascular accident (CVA), cryptogenic Patient has not had follow-up with neurology or cardiology.  Those referrals were put in place today as stat.  Patient not on an antiplatelet medication at this point.  I did ask her to start aspirin and Plavix.  The aspirin at a low dose is just fine as it will likely be discontinued by her  neurologist.  For secondary  stroke prevention we will work on keeping her hemoglobin A1c below 6.5, we will try to maximize her lipid panel with statin, and we will try to keep her blood pressure below 130/90.  - Ambulatory referral to Cardiology - Ambulatory referral to Neurology - aspirin EC 81 MG tablet; Take 1 tablet (81 mg total) by mouth daily.  Dispense: 30 tablet; Refill: 0 - clopidogrel (PLAVIX) 75 MG tablet; Take 1 tablet (75 mg total) by mouth daily.  Dispense: 90 tablet; Refill: 3 - MR Brain Wo Contrast; Future - MR MRA HEAD WO CONTRAST; Future  2. Left-sided weakness Patient was diagnosed with a cerebral infarction at the end of 2018.  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  She has been following with outpatient rehab for this issue.  [name] having no improvement.  - MR Brain Wo Contrast; Future - MR MRA HEAD WO CONTRAST; Future  3. Bilateral lower extremity edema Weight is stable.  4. Chronic kidney disease, stage V ([place]) This is decompensating.  The patient did see her nephrologist last Friday.  My report is from the patient as I have not yet read any consult note.  With stage V CKD, they did discuss dialysis.  The change from torsemide was approved.  Patient states that she was put on another blood pressure medicine a few months ago but cannot remove her the name of that.  I will work on getting records to review.  Improved on torsemide.  5. Diabetes mellitus, type II, insulin dependent ([place])  Lab Results  Component Value Date   HGBA1C 6.4 (H) 12/03/2017   Patient is tolerating her usual regimen of Tresiba and NovoLog.  6. Severe dizziness Patient was seen in the emergency room for dizziness.  She was diagnosed with vertigo.  She was prescribed Valium, which caused severe imbalance issues.  She stopped taking the medication immediately.  She has associated nausea with dizziness as well.  She cannot tolerate the oral dissolving form and  asks for the tablet.  No imaging was completed at her prior ER stay.  Unfortunately, the patient's dizziness has not improved.  With her previous recent cerebral stroke (also in the setting of lack of anticoagulation and labile blood pressures), I do think that it is best interest of the patient to obtain new imaging.  - ondansetron (ZOFRAN) 4 MG tablet; Take 1 tablet (4 mg total) by mouth every 8 (eight) hours as needed for nausea or vomiting.  Dispense: 20 tablet; Refill: 0 - MR Brain Wo Contrast; Future - MR MRA HEAD WO CONTRAST; Future  7. Essential hypertension The patient's blood pressures have been labile.  Her visit with endocrinology was yesterday and she reports a blood pressure of 140/82.  Today it is slightly elevated at 178/94.  She has not taken her meds this morning, stating that she takes that as soon as she gets to work.  She has a mild headache today.  This seems to be worse when she has hypertension.  Patient states that she was put on another blood pressure medicine by her renal doctor.  I do not have any record of this.  Her visit this morning was too early to call the pharmacy.  We will work on this to make sure that we have an accurate and updated medication list.  We reviewed the importance of recognizing red flags and reasons to report to the ER immediately.  - Ambulatory referral to Cardiology   . Reviewed expectations  re: course of current medical issues. . Discussed self-management of symptoms. . Outlined signs and symptoms indicating need for more acute intervention. . Patient verbalized understanding and all questions were answered. [name] Health Maintenance issues including appropriate healthy diet, exercise, and smoking avoidance were discussed with patient. . See orders for this visit as documented in the electronic medical record. . Patient received an After Visit Summary.   Briscoe Deutscher, DO [place], [place] 01/12/2018  Records requested if needed. Time  spent with the patient: 45 minutes, of which >50% was spent in obtaining information about her symptoms, reviewing her previous labs, evaluations, and treatments, counseling her about her condition (please see the discussed topics above), and developing a plan to further investigate it; she had a number of questions which I addressed.

## 2018-01-13 ENCOUNTER — Ambulatory Visit
Admission: RE | Admit: 2018-01-13 | Discharge: 2018-01-13 | Disposition: A | Discharge status: Home / Self Care | Payer: Managed Care, Other (non HMO) | Source: Ambulatory Visit | Attending: Family Medicine | Admitting: Family Medicine

## 2018-01-13 ENCOUNTER — Ambulatory Visit: Payer: Managed Care, Other (non HMO) | Admitting: Rehabilitation

## 2018-01-16 ENCOUNTER — Telehealth: Payer: Self-pay | Admitting: Family Medicine

## 2018-01-16 LAB — CUP PACEART REMOTE DEVICE CHECK
Implantable Pulse Generator Implant Date: 20181210
MDC IDC SESS DTM: 20190109204101

## 2018-01-16 NOTE — Telephone Encounter (Signed)
Copied from [place]. Topic: Quick Communication - Lab Results >> Jan 16, 2018  3:24 PM Oliver Pila B wrote: Pt called to get the results from her imaging tests that were done last week, contact pt to advise

## 2018-01-17 ENCOUNTER — Telehealth: Payer: Self-pay | Admitting: Family Medicine

## 2018-01-17 ENCOUNTER — Encounter (HOSPITAL_COMMUNITY)
Admission: RE | Admit: 2018-01-17 | Discharge: 2018-01-17 | Disposition: A | Discharge status: Home / Self Care | Payer: Managed Care, Other (non HMO) | Source: Ambulatory Visit | Attending: Nephrology | Admitting: Nephrology

## 2018-01-17 VITALS — BP 175/94 | HR 89 | Temp 97.9°F | Resp 16

## 2018-01-17 DIAGNOSIS — N182 Chronic kidney disease, stage 2 (mild): Secondary | ICD-10-CM | POA: Insufficient documentation

## 2018-01-17 LAB — IRON AND TIBC
Iron: 66 ug/dL (ref 28–170)
Saturation Ratios: 31 % (ref 10.4–31.8)
TIBC: 214 ug/dL — ABNORMAL LOW (ref 250–450)
UIBC: 148 ug/dL

## 2018-01-17 LAB — FERRITIN: Ferritin: 599 ng/mL — ABNORMAL HIGH (ref 11–307)

## 2018-01-17 LAB — POCT HEMOGLOBIN-HEMACUE: Hemoglobin: 10 g/dL — ABNORMAL LOW (ref 12.0–15.0)

## 2018-01-17 MED ORDER — EPOETIN ALFA 40000 UNIT/ML IJ SOLN
INTRAMUSCULAR | Status: AC
  Filled 2018-01-17: qty 1

## 2018-01-17 MED ORDER — EPOETIN ALFA 40000 UNIT/ML IJ SOLN
40000.0000 [IU] | INTRAMUSCULAR | Status: DC
  Administered 2018-01-17: 40000 [IU] via SUBCUTANEOUS

## 2018-01-17 NOTE — Telephone Encounter (Addendum)
Left message on patients voicemail that paperwork was faxed on 01/10/18. Dr. Juleen China put her on intermittent leave. She also put on the [facility] paperwork that she recommends decreasing to 30 hours per week for 6 months. I left number for patient to return call with any questions.

## 2018-01-17 NOTE — Telephone Encounter (Signed)
Copied from [place]. Topic: Inquiry >> Jan 17, 2018 10:49 AM Corie Chiquito, NT wrote: Reason for CRM: Patient calling because her FMLA paper work wasn't filled out right and she only has two days to get the paperwork back to them. They need the range of leave. The leave request number is [phone]. Patient is listed under [employer] for [employer]. If someone could give her a call back about this at [phone]

## 2018-01-17 NOTE — Telephone Encounter (Signed)
Please advise on results on scans.

## 2018-01-17 NOTE — Telephone Encounter (Signed)
Pt calling back stating FMLA is needing a start and end date. Patient would like a call back if possible [phone]

## 2018-01-17 NOTE — Telephone Encounter (Signed)
Please contact patient to advise on the note below.

## 2018-01-17 NOTE — Telephone Encounter (Signed)
Please see results notes

## 2018-01-18 ENCOUNTER — Ambulatory Visit: Payer: Managed Care, Other (non HMO) | Admitting: Physical Therapy

## 2018-01-18 ENCOUNTER — Encounter: Payer: Self-pay | Admitting: Physical Therapy

## 2018-01-18 VITALS — BP 160/86

## 2018-01-18 DIAGNOSIS — I69354 Hemiplegia and hemiparesis following cerebral infarction affecting left non-dominant side: Secondary | ICD-10-CM | POA: Diagnosis not present

## 2018-01-18 DIAGNOSIS — R2689 Other abnormalities of gait and mobility: Secondary | ICD-10-CM

## 2018-01-18 DIAGNOSIS — M6281 Muscle weakness (generalized): Secondary | ICD-10-CM

## 2018-01-18 DIAGNOSIS — R2681 Unsteadiness on feet: Secondary | ICD-10-CM

## 2018-01-18 NOTE — Patient Instructions (Addendum)
Compensatory Strategies: Corrective Saccades    1. Holding two stationary targets placed just less than shoulder width apart, move eyes to target, keep head still. 2. Then move head in direction of target while eyes remain on target. 3/4. Repeat in opposite direction. Perform sitting. Repeat sequence __5-6_ times per session. Do _1_ sessions per day.  Copyright  VHI. All rights reserved.     Hold the green band to you nose with the yellow band closest to you and hold the other end of the band out as shown.   Move your eyes from the yellow band, to the red band (back and forth).  Hold each position for ~5-6 seconds to allow eyes to focus.  6-8 reps, perform daily.  Perform these in a corner with a chair in front of you for safety:  Feet Apart (Compliant Surface) Varied Arm Positions - Eyes Closed    Stand on compliant surface: pillow/s with feet shoulder width apart and at sides or as needed for balance. Close eyes and visualize upright position. Hold___30_ seconds. Repeat _3_ times per session. Do _1_ sessions per day.  Copyright  VHI. All rights reserved.   Feet Apart (Compliant Surface) Head Motion - Eyes Closed    Stand on compliant surface: _pillow/s_ with feet shoulder width apart. Close eyes and move head slowly: 1. Up and down x 10 reps 2. Left and right x 10 reps 3. Up right/down left x 10 reps 4. Up left/down right x 10 reps  Do _1_ sessions per day.  Copyright  VHI. All rights reserved.

## 2018-01-18 NOTE — Telephone Encounter (Signed)
Patient returned phone call. Please contact to advise regarding possible additional questions.  Copied from [place] [phone]. Topic: General - Call Back - No Documentation >> Jan 18, 2018  8:54 AM Arletha Grippe wrote: Reason for CRM: pt returning call - no crm  Call different number - [phone]

## 2018-01-18 NOTE — Telephone Encounter (Signed)
Notified patient that new paperwork has been faxed.

## 2018-01-18 NOTE — Telephone Encounter (Signed)
Patient notified of results.

## 2018-01-18 NOTE — Telephone Encounter (Signed)
Called patient l/m to let her know that ppw was corrected and faxed. To call office if any further questions.

## 2018-01-19 ENCOUNTER — Ambulatory Visit: Payer: Managed Care, Other (non HMO) | Admitting: Rehabilitation

## 2018-01-19 ENCOUNTER — Encounter: Payer: Self-pay | Admitting: Rehabilitation

## 2018-01-19 VITALS — BP 150/90

## 2018-01-19 DIAGNOSIS — I69354 Hemiplegia and hemiparesis following cerebral infarction affecting left non-dominant side: Secondary | ICD-10-CM | POA: Diagnosis not present

## 2018-01-19 DIAGNOSIS — M6281 Muscle weakness (generalized): Secondary | ICD-10-CM

## 2018-01-19 DIAGNOSIS — R2689 Other abnormalities of gait and mobility: Secondary | ICD-10-CM

## 2018-01-19 DIAGNOSIS — R2681 Unsteadiness on feet: Secondary | ICD-10-CM

## 2018-01-19 NOTE — Telephone Encounter (Signed)
Called patient reviewed ppw with patient made corrections and faxed.

## 2018-01-19 NOTE — Patient Instructions (Addendum)
Compensatory Strategies: Corrective Saccades    1. Holding two stationary targets placed just less than shoulder width apart, move eyes to target, keep head still. 2. Then move head in direction of target while eyes remain on target. 3/4. Repeat in opposite direction. Perform sitting. Repeat sequence __5-6_ times per session. Do _1_ sessions per day.  Copyright  VHI. All rights reserved.     Hold the green band to you nose with the yellow band closest to you and hold the other end of the band out as shown.   Move your eyes from the yellow band, to the red band (back and forth).  Hold each position for ~5-6 seconds to allow eyes to focus.  6-8 reps, perform daily.  Perform these in a corner with a chair in front of you for safety:  Feet Apart (Compliant Surface) Varied Arm Positions - Eyes Closed    Stand on compliant surface: pillow/s with feet shoulder width apart and at sides or as needed for balance. Close eyes and visualize upright position. Hold___30_ seconds. Repeat _3_ times per session. Do _1_ sessions per day.  Copyright  VHI. All rights reserved.   Feet Apart (Compliant Surface) Head Motion - Eyes Open    With eyes open, standing on compliant surface: ___pillow_____, feet shoulder width apart, move head slowly: up and down x 10 reps, side to side x 10 reps and diagonally x 10 reps each direction.  Do _2___ sessions per day.  Copyright  VHI. All rights reserved.    Feet Apart, Head Motion - Eyes Open    With eyes open, feet apart, move head slowly: up and down x 10 reps, side to side x 10 reps and diagonally x 10 reps each direction.    Do _2__ sessions per day.  Copyright  VHI. All rights reserved.   Feet Together, Varied Arm Positions - Eyes Open    With eyes open, feet together, arms by your side, look straight ahead at a stationary object. Hold __20-30_ seconds. Repeat __1__ times per session. Do __2__ sessions per day.  Copyright   VHI. All rights reserved.    Heel Raises    Stand with support. Tighten pelvic floor and hold. With knees straight, raise heels off ground. Hold _2-3__ seconds. Relax for _2-3__ seconds. Repeat _10__ times. Do __2_ times a day.  Copyright  VHI. All rights reserved.   "I love a Parade" Lift    Using a chair if necessary, march in place raising leg as high as possible.  When lifting left leg, keep right leg tight and tall.   Repeat __10__ times. Do _2__ sessions per day.  [email or web address].[email or web address]   Copyright  VHI. All rights reserved.

## 2018-01-19 NOTE — Telephone Encounter (Signed)
Please advise 

## 2018-01-19 NOTE — Therapy (Signed)
[facility] [address] [place], [place], [address] Phone: [phone]   Fax:  [phone]  Physical Therapy Treatment  Patient Details  Name: Heather [name] MRN: [id number] Date of Birth: 12/15/1971 Referring Provider: Briscoe Deutscher, DO   Encounter Date: 01/18/2018  PT End of Session - 01/18/18 1625    Visit Number  4    Number of Visits  13    Date for PT Re-Evaluation  02/13/18    Authorization Type  [employer]     PT Start Time  1621    PT Stop Time  1659    PT Time Calculation (min)  38 min    Activity Tolerance  Patient tolerated treatment well    Behavior During Therapy  [facility] for tasks assessed/performed       Past Medical History:  Diagnosis Date  . Allergy   . Anemia of chronic disease   . B12 deficiency 05/10/2014  . Blood transfusion without reported diagnosis   . Bronchitis   . Chronic constipation   . Chronic kidney disease   . Constipation   . CVA (cerebral vascular accident) ([place]) 12/03/2017   nonhemorrhagic infarct-- inferior right cerebellum  . Diabetic neuropathy, painful ([place]) 07/13/2013   Stop lyrica due to cost & SE of drowsy. Gabapentin helps Stay off hydrocodone: pt states she thinks "she was addicted" to med. "I don't want that anymore."   . DM2 (diabetes mellitus, type 2) ([place])   . Dyspnea    with exertion  . Fibromyalgia   . Gastroparesis due to DM   . GERD (gastroesophageal reflux disease)   . Headache   . Heart murmur, systolic 83/05/6293   Grade 1/6 ejection murmur per [facility] notes.   . Hyperlipemia   . Hypertension   . IBS (irritable bowel syndrome)   . Infertility   . Leiomyoma of uterus   . Morbid obesity due to excess calories ([place]) 11/16/2016  . Neuropathy   . Obesity   . Pancreatitis   . 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 Right-sided low back pain without sciatica 12/08/2015  . Secondary hyperthyroidism 11/27/2013   Followed by [employer], notes reviewed 01/01/15. PTH had improved with oral vitamin D: 133 to 85. Last 155-probably not taking vit D.    [name] Upper airway cough syndrome 11/15/2016   rec off acei/ on pepcid hs 11/15/2016 >>>     Past Surgical History:  Procedure Laterality Date  . AV FISTULA PLACEMENT Left 05/09/2017   Procedure: INSERTION OF ARTERIOVENOUS (AV) GRAFT ARM (ARTEGRAFT);  Surgeon: Conrad Sterling, MD;  Location: [facility];  Service: Vascular;  Laterality: Left;  . BASCILIC VEIN TRANSPOSITION Left 08/31/2016   Procedure: BASILIC VEIN TRANSPOSITION FIRST STAGE;  Surgeon: Conrad McKenzie, MD;  Location: [facility];  Service: Vascular;  Laterality: Left;  . FOOT SURGERY Right    t"ook bone out- maybe hammer toe"  . LIGATION OF ARTERIOVENOUS  FISTULA Left 05/09/2017   Procedure: LIGATION OF ARTERIOVENOUS  FISTULA;  Surgeon: Conrad Rose Hill, MD;  Location: [facility];  Service: Vascular;  Laterality: Left;  . LOOP RECORDER INSERTION N/A 12/05/2017   Procedure: LOOP RECORDER INSERTION;  Surgeon: Evans Lance, MD;  Location: [facility];  Service: Cardiovascular;  Laterality: N/A;  . MYOMECTOMY    . TEE WITHOUT CARDIOVERSION N/A 12/05/2017   Procedure: TRANSESOPHAGEAL ECHOCARDIOGRAM (TEE);  Surgeon: Sanda Klein, MD;  Location: [facility];  Service: Cardiovascular;  Laterality:  N/A;  . VITRECTOMY Bilateral     Vitals:   01/18/18 1623  BP: (!) 160/86    Subjective Assessment - 01/18/18 1623    Subjective  No new complaitns. Reports dizziness has mostly cleared, had one episode yesterday since last session that cleared with sitting. Lasted for about 1.5 hours with room spinning.     Pertinent History  CVA on 12/02/17, DM with peripheral neuropathy, fibromyalgia     Limitations  House hold activities;Standing    Patient Stated Goals  "I want to feel stable on my feet."      Currently in Pain?  No/denies    Pain Score  0-No pain      issued the following to HEP with cues on form/technique and min guard to  min assist with standing ex's:  Compensatory Strategies: Corrective Saccades    1. Holding two stationary targets placed just less than shoulder width apart, move eyes to target, keep head still. 2. Then move head in direction of target while eyes remain on target. 3/4. Repeat in opposite direction. Perform sitting. Repeat sequence __5-6_ times per session. Do _1_ sessions per day.  Copyright  VHI. All rights reserved.     Hold the green band to you nose with the yellow band closest to you and hold the other end of the band out as shown.   Move your eyes from the yellow band, to the red band (back and forth).  Hold each position for ~5-6 seconds to allow eyes to focus.  6-8 reps, perform daily.  Perform these in a corner with a chair in front of you for safety:  Feet Apart (Compliant Surface) Varied Arm Positions - Eyes Closed    Stand on compliant surface: pillow/s with feet shoulder width apart and at sides or as needed for balance. Close eyes and visualize upright position. Hold___30_ seconds. Repeat _3_ times per session. Do _1_ sessions per day.  Copyright  VHI. All rights reserved.   Feet Apart (Compliant Surface) Head Motion - Eyes Closed    Stand on compliant surface: _pillow/s_ with feet shoulder width apart. Close eyes and move head slowly: 1. Up and down x 10 reps 2. Left and right x 10 reps 3. Up right/down left x 10 reps 4. Up left/down right x 10 reps  Do _1_ sessions per day.  Copyright  VHI. All rights reserved.        PT Education - 01/18/18 1656    Education provided  Yes    Education Details  HEP for balance and for corrective sarcades    Person(s) Educated  Patient    Methods  Explanation;Demonstration;Verbal cues;Handout    Comprehension  Verbalized understanding;Returned demonstration;Verbal cues required;Need further instruction       PT Short Term Goals - 01/02/18 1351      PT SHORT TERM GOAL #1   Title  Pt will initiate HEP  in order to indicate improved functional mobility and decreased fall risk.  (Target Date: 01/20/17)    Time  3    Period  Weeks    Status  On-going      PT SHORT TERM GOAL #2   Title  Will assess DGI in order to better assess balance deficits and fall risk.      Baseline  01/02/18: 17/24 scored as baseline today    Status  Achieved      PT SHORT TERM GOAL #3   Title  Will assess 6MWT and will improve distance by  27' w/ LRAD in order to indicate improved endurance.      Baseline  01/02/18: baseline distance was 633 feet    Time  3    Period  Weeks    Status  On-going        PT Long Term Goals - 12/30/17 1957      PT LONG TERM GOAL #1   Title  Pt will be independent with final HEP in order to indicate improved functional mobilty and decreased fall risk.  (Target Date: 02/13/17)    Time  6    Period  Weeks    Status  New    Target Date  02/13/18      PT LONG TERM GOAL #2   Title  Pt will perform 5TSS in </= 20 secs without UE support in order to indicate decreased fall risk and improved functional strength.      Time  3    Period  Weeks    Status  New      PT LONG TERM GOAL #3   Title  Pt will improve gait speed to >/= 2.62 ft/sec w/ LRAD in order to indicate pt safe community ambulator.      Time  3    Period  Weeks    Status  New      PT LONG TERM GOAL #4   Title  Pt will score >19/24 on DGI in order to indicate decreased fall risk.      Time  6    Period  Weeks      PT LONG TERM GOAL #5   Title  Pt will improve 6MWT 150' from baseline with LRAD in order to indicate improved functional endurance.      Time  6    Period  Weeks    Status  New            Plan - 01/18/18 1625    Clinical Impression Statement  Today's skilled session focused on establishment of an HEP to address balance and vestibular issues. Pt is making steady progress toward goals and should benefit from continued PT to progress toward unmet goals.     Rehab Potential  Good    Clinical Impairments  Affecting Rehab Potential  pt motivated to return to normal function    PT Frequency  2x / week    PT Duration  6 weeks    PT Treatment/Interventions  ADLs/Self Care Home Management;Electrical Stimulation;DME Instruction;Gait training;Stair training;Functional mobility training;Therapeutic activities;Therapeutic exercise;Balance training;Neuromuscular re-education;Patient/family education;Orthotic Fit/Training;Manual techniques;Passive range of motion;Energy conservation;Vestibular    PT Next Visit Plan  Check BP (use manual cuff); continue to monitor dizziness/response to treatment;     Consulted and Agree with Plan of Care  Patient       Patient will benefit from skilled therapeutic intervention in order to improve the following deficits and impairments:  Abnormal gait, Decreased activity tolerance, Decreased balance, Decreased endurance, Decreased knowledge of use of DME, Decreased mobility, Decreased strength, Dizziness, Impaired perceived functional ability, Impaired flexibility, Impaired sensation, Improper body mechanics  Visit Diagnosis: Unsteadiness on feet  Other abnormalities of gait and mobility  Hemiplegia and hemiparesis following cerebral infarction affecting left non-dominant side (HCC)  Muscle weakness (generalized)     Problem List Patient Active Problem List   Diagnosis Date Noted  . Cyst of left ovary 01/12/2018  . Dysmenorrhea 01/12/2018  . Insomnia 01/12/2018  . Menorrhagia 01/12/2018  . 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 Vitamin D deficiency 01/02/2018  .  Bilateral lower extremity edema 01/02/2018  . Reactive depression 12/10/2017  . Left-sided weakness 12/10/2017  . Stroke (cerebrum) ([place]) 12/02/2017  . Dysfunction of left eustachian tube 11/24/2017  . Arthralgia of right temporomandibular joint 10/24/2017  . Morbid obesity due to excess calories ([place]) 11/16/2016  . Upper airway cough syndrome 11/15/2016  . DM (diabetes mellitus), type 2,  uncontrolled, with renal complications ([place]) 00/17/4944  . Dyslipidemia associated with type 2 diabetes mellitus ([place]) 06/25/2016  . Uncontrolled type 2 diabetes mellitus with both eyes affected by proliferative retinopathy and macular edema, with long-term current use of insulin ([place]) 06/22/2016  . Uncontrolled type 2 diabetes mellitus with diabetic polyneuropathy, with long-term current use of insulin ([place]) 03/08/2016  . Uncontrolled type 2 diabetes mellitus with stage 4 chronic kidney disease, with long-term current use of insulin ([place]) 03/08/2016  . Right-sided low back pain without sciatica 12/08/2015  . Proliferative diabetic retinopathy ([place]) 09/29/2015  . Pseudophakia of both eyes 05/30/2015  . 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 B12 deficiency 05/10/2014  . Anemia in chronic kidney disease 04/26/2014  . Chronic kidney disease, stage V ([place]) 04/26/2014  . Heart murmur, systolic 96/75/9163  . Secondary hyperparathyroidism ([place]) 11/27/2013  . Posterior subcapsular cataract, bilateral 10/18/2013  . Diabetic neuropathy, painful ([place]) 07/13/2013  . Diabetes mellitus, type II, insulin dependent ([place]) 02/19/2011  . Essential hypertension 02/19/2011  . Gastroparesis 02/19/2011    Willow Ora 01/19/2018, 8:41 AM  [facility] [address] [place], [place], [address] Phone: [phone]   Fax:  [phone]  Name: Heather [name] MRN: [id number] Date of Birth: February 16, 1971

## 2018-01-19 NOTE — Telephone Encounter (Signed)
PT CALLED TO STATE THE FMLA PAPERWORK HAS SOME DISCREPANCIES AND IS NEEDED TO BE CORRECTED, PT STATES ATTEMPTS HAVE BEEN MADE FROM THE EMPLOYER TO CONTACT PHYSICIAN W/ NO SUCCESS. THE PAPERWORK IS NEEDED TO BE DONE TODAY, THE FORM IS NEEDING TO HAVE DATES CORRECTED, CONTACT PT TO ADVISE ASAP

## 2018-01-19 NOTE — Therapy (Signed)
[facility] [address] [place], [place], [address] Phone: [phone]   Fax:  [phone]  Physical Therapy Treatment  Patient Details  Name: Heather [name] MRN: [id number] Date of Birth: 07/16/1971 Referring Provider: Briscoe Deutscher, DO   Encounter Date: 01/19/2018  PT End of Session - 01/19/18 1959    Visit Number  5    Number of Visits  13    Date for PT Re-Evaluation  02/13/18    Authorization Type  [employer]ign[employer]     PT Start Time  1533    PT Stop Time  1620    PT Time Calculation (min)  47 min    Activity Tolerance  Patient tolerated treatment well    Behavior During Therapy  [facility] for tasks assessed/performed       Past Medical History:  Diagnosis Date  . Allergy   . Anemia of chronic disease   . B12 deficiency 05/10/2014  . Blood transfusion without reported diagnosis   . Bronchitis   . Chronic constipation   . Chronic kidney disease   . Constipation   . CVA (cerebral vascular accident) ([place]) 12/03/2017   nonhemorrhagic infarct-- inferior right cerebellum  . Diabetic neuropathy, painful ([place]) 07/13/2013   Stop lyrica due to cost & SE of drowsy. Gabapentin helps Stay off hydrocodone: pt states she thinks "she was addicted" to med. "I don't want that anymore."   . DM2 (diabetes mellitus, type 2) ([place])   . Dyspnea    with exertion  . Fibromyalgia   . Gastroparesis due to DM   . GERD (gastroesophageal reflux disease)   . Headache   . Heart murmur, systolic 99/02/5700   Grade 1/6 ejection murmur per [facility] notes.   . Hyperlipemia   . Hypertension   . IBS (irritable bowel syndrome)   . Infertility   . Leiomyoma of uterus   . Morbid obesity due to excess calories ([place]) 11/16/2016  . Neuropathy   . Obesity   . Pancreatitis   . 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 Right-sided low back pain without sciatica 12/08/2015  . Secondary hyperthyroidism 11/27/2013   Followed by [employer], notes reviewed 01/01/15. PTH had improved with oral vitamin D: 133 to 85. Last 155-probably not taking vit D.    [name] Upper airway cough syndrome 11/15/2016   rec off acei/ on pepcid hs 11/15/2016 >>>     Past Surgical History:  Procedure Laterality Date  . AV FISTULA PLACEMENT Left 05/09/2017   Procedure: INSERTION OF ARTERIOVENOUS (AV) GRAFT ARM (ARTEGRAFT);  Surgeon: Conrad Taylor, MD;  Location: [facility];  Service: Vascular;  Laterality: Left;  . BASCILIC VEIN TRANSPOSITION Left 08/31/2016   Procedure: BASILIC VEIN TRANSPOSITION FIRST STAGE;  Surgeon: Conrad Rose Hill, MD;  Location: [name];  Service: Vascular;  Laterality: Left;  . FOOT SURGERY Right    t"ook bone out- maybe hammer toe"  . LIGATION OF ARTERIOVENOUS  FISTULA Left 05/09/2017   Procedure: LIGATION OF ARTERIOVENOUS  FISTULA;  Surgeon: Conrad Madera, MD;  Location: [facility];  Service: Vascular;  Laterality: Left;  . LOOP RECORDER INSERTION N/A 12/05/2017   Procedure: LOOP RECORDER INSERTION;  Surgeon: Evans Lance, MD;  Location: [facility];  Service: Cardiovascular;  Laterality: N/A;  . MYOMECTOMY    . TEE WITHOUT CARDIOVERSION N/A 12/05/2017   Procedure: TRANSESOPHAGEAL ECHOCARDIOGRAM (TEE);  Surgeon: Sanda Klein, MD;  Location: [facility];  Service: Cardiovascular;  Laterality:  N/A;  . VITRECTOMY Bilateral     Vitals:   01/19/18 1547  BP: (!) 150/90    Subjective Assessment - 01/19/18 1542    Subjective  Continues to state dizziness is better.  She does report dizziness still present with side to side movement.     Pertinent History  CVA on 12/02/17, DM with peripheral neuropathy, fibromyalgia     Limitations  House hold activities;Standing    Patient Stated Goals  "I want to feel stable on my feet."      Currently in Pain?  No/denies                      [facility] Adult PT Treatment/Exercise - 01/19/18 1535      Neuro Re-ed    Neuro Re-ed Details   Continue to address vestibular deficits  during session with horizontal saccades.  Pt with no dizziness during exercise, therefore had pt perform head with eye movement horizontally with break in the middle initally and progressing from pure side to side motion.  Note no dizziness but marked unsteadiness even on solid ground.  This prompted PT to go over current HEP and address vestibular deficits.  Performed standing with feet apart EO w/ head turns in all directions including diagonally x 10 reps each>feet together EO x 2 sets of 20 secs>feet apart EC x 2 sets of 20 secs.  Progressed to standing on compliant surface with feet apart EO with head turns in all directions x 10 reps each, feet apart EC x 2 sets of 30 secs, feet narrowed EO w/ head turns in all directions.  Made updates to HEP, see pt instruction for details.        Exercises   Exercises  Other Exercises    Other Exercises   Performed forward marching along countertop for balance and hip strengthening.  Note that she tends to collapse in R LE stance, therefore had her perform marching in place with cues for improved proximal hip activation.  Pt tolerated well and performed x 10 reps.  Attempted walking on heels, however pt with very little active DF in either LE, therefore assessed in standing position performing heel and toe raises.  Note very little DF activation, however encouraged proper posture and to continue task.  Note that during heel raises, she tends to want to push with UEs, and when cued note marked weakness in B ankle PF.  Provided this for HEP, see pt instruction for details on updated HEP>          Note BP at beginning of session was 150/90, following counter top exercises, she reports dizziness.  PT checked BP and note reading to be 132/80.  Following seated rest breaks, she reports improvement.  Pt stating she drinks lots of water all the time and that she just started her BP medication yesterday and has only had 2 doses.  Will continue to monitor.       PT  Education - 01/19/18 1959    Education provided  Yes    Education Details  updated HEP    Person(s) Educated  Patient    Methods  Explanation;Demonstration;Handout    Comprehension  Verbalized understanding;Returned demonstration       PT Short Term Goals - 01/02/18 1351      PT SHORT TERM GOAL #1   Title  Pt will initiate HEP in order to indicate improved functional mobility and decreased fall risk.  (Target Date: 01/20/17)  Time  3    Period  Weeks    Status  On-going      PT SHORT TERM GOAL #2   Title  Will assess DGI in order to better assess balance deficits and fall risk.      Baseline  01/02/18: 17/24 scored as baseline today    Status  Achieved      PT SHORT TERM GOAL #3   Title  Will assess 6MWT and will improve distance by 65' w/ LRAD in order to indicate improved endurance.      Baseline  01/02/18: baseline distance was 633 feet    Time  3    Period  Weeks    Status  On-going        PT Long Term Goals - 12/30/17 1957      PT LONG TERM GOAL #1   Title  Pt will be independent with final HEP in order to indicate improved functional mobilty and decreased fall risk.  (Target Date: 02/13/17)    Time  6    Period  Weeks    Status  New    Target Date  02/13/18      PT LONG TERM GOAL #2   Title  Pt will perform 5TSS in </= 20 secs without UE support in order to indicate decreased fall risk and improved functional strength.      Time  3    Period  Weeks    Status  New      PT LONG TERM GOAL #3   Title  Pt will improve gait speed to >/= 2.62 ft/sec w/ LRAD in order to indicate pt safe community ambulator.      Time  3    Period  Weeks    Status  New      PT LONG TERM GOAL #4   Title  Pt will score >19/24 on DGI in order to indicate decreased fall risk.      Time  6    Period  Weeks      PT LONG TERM GOAL #5   Title  Pt will improve 6MWT 150' from baseline with LRAD in order to indicate improved functional endurance.      Time  6    Period  Weeks    Status   New            Plan - 01/19/18 2000    Clinical Impression Statement  Session focused on current HEP, continued balance and vestibular challenges, as well as BLE strengthening.  Pt with very limited ankle strength and feel that this is contributing to poor balance with corner balance tasks.      Rehab Potential  Good    Clinical Impairments Affecting Rehab Potential  pt motivated to return to normal function    PT Frequency  2x / week    PT Duration  6 weeks    PT Treatment/Interventions  ADLs/Self Care Home Management;Electrical Stimulation;DME Instruction;Gait training;Stair training;Functional mobility training;Therapeutic activities;Therapeutic exercise;Balance training;Neuromuscular re-education;Patient/family education;Orthotic Fit/Training;Manual techniques;Passive range of motion;Energy conservation;Vestibular    PT Next Visit Plan  Check BP (use manual cuff); continue to monitor dizziness/response to treatment; BLE strength and balance, does she need B foot up braces/ AFOs?    Consulted and Agree with Plan of Care  Patient       Patient will benefit from skilled therapeutic intervention in order to improve the following deficits and impairments:  Abnormal gait, Decreased activity tolerance, Decreased balance, Decreased endurance,  Decreased knowledge of use of DME, Decreased mobility, Decreased strength, Dizziness, Impaired perceived functional ability, Impaired flexibility, Impaired sensation, Improper body mechanics  Visit Diagnosis: Unsteadiness on feet  Other abnormalities of gait and mobility  Hemiplegia and hemiparesis following cerebral infarction affecting left non-dominant side (HCC)  Muscle weakness (generalized)     Problem List Patient Active Problem List   Diagnosis Date Noted  . Cyst of left ovary 01/12/2018  . Dysmenorrhea 01/12/2018  . Insomnia 01/12/2018  . Menorrhagia 01/12/2018  . 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 Vitamin D deficiency  01/02/2018  . Bilateral lower extremity edema 01/02/2018  . Reactive depression 12/10/2017  . Left-sided weakness 12/10/2017  . Stroke (cerebrum) ([place]) 12/02/2017  . Dysfunction of left eustachian tube 11/24/2017  . Arthralgia of right temporomandibular joint 10/24/2017  . Morbid obesity due to excess calories ([place]) 11/16/2016  . Upper airway cough syndrome 11/15/2016  . DM (diabetes mellitus), type 2, uncontrolled, with renal complications ([place]) 34/74/2595  . Dyslipidemia associated with type 2 diabetes mellitus ([place]) 06/25/2016  . Uncontrolled type 2 diabetes mellitus with both eyes affected by proliferative retinopathy and macular edema, with long-term current use of insulin ([place]) 06/22/2016  . Uncontrolled type 2 diabetes mellitus with diabetic polyneuropathy, with long-term current use of insulin ([place]) 03/08/2016  . Uncontrolled type 2 diabetes mellitus with stage 4 chronic kidney disease, with long-term current use of insulin ([place]) 03/08/2016  . Right-sided low back pain without sciatica 12/08/2015  . Proliferative diabetic retinopathy ([place]) 09/29/2015  . Pseudophakia of both eyes 05/30/2015  . 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 B12 deficiency 05/10/2014  . Anemia in chronic kidney disease 04/26/2014  . Chronic kidney disease, stage V ([place]) 04/26/2014  . Heart murmur, systolic 63/87/5643  . Secondary hyperparathyroidism ([place]) 11/27/2013  . Posterior subcapsular cataract, bilateral 10/18/2013  . Diabetic neuropathy, painful ([place]) 07/13/2013  . Diabetes mellitus, type II, insulin dependent ([place]) 02/19/2011  . Essential hypertension 02/19/2011  . Gastroparesis 02/19/2011    Cameron Sprang, PT, MPT [facility] [address] [place], [place], [address] Phone: [phone]   Fax:  [phone] 01/19/18, 8:05 PM  Name: Heather [name] MRN: [id number] Date of Birth: [birth year]

## 2018-01-23 ENCOUNTER — Ambulatory Visit: Payer: Managed Care, Other (non HMO) | Admitting: Rehabilitation

## 2018-01-25 ENCOUNTER — Ambulatory Visit (HOSPITAL_COMMUNITY)
Admission: RE | Admit: 2018-01-25 | Discharge: 2018-01-25 | Disposition: A | Discharge status: Home / Self Care | Payer: Managed Care, Other (non HMO) | Source: Ambulatory Visit | Attending: Vascular Surgery | Admitting: Vascular Surgery

## 2018-01-25 ENCOUNTER — Ambulatory Visit (INDEPENDENT_AMBULATORY_CARE_PROVIDER_SITE_OTHER)
Admission: RE | Admit: 2018-01-25 | Discharge: 2018-01-25 | Disposition: A | Discharge status: Home / Self Care | Payer: Managed Care, Other (non HMO) | Source: Ambulatory Visit | Attending: Vascular Surgery | Admitting: Vascular Surgery

## 2018-01-25 DIAGNOSIS — N185 Chronic kidney disease, stage 5: Secondary | ICD-10-CM | POA: Insufficient documentation

## 2018-01-25 DIAGNOSIS — Z01818 Encounter for other preprocedural examination: Secondary | ICD-10-CM

## 2018-01-27 ENCOUNTER — Encounter: Payer: Self-pay | Admitting: Vascular Surgery

## 2018-01-27 ENCOUNTER — Ambulatory Visit (INDEPENDENT_AMBULATORY_CARE_PROVIDER_SITE_OTHER): Payer: Managed Care, Other (non HMO) | Admitting: Vascular Surgery

## 2018-01-27 ENCOUNTER — Ambulatory Visit: Payer: Managed Care, Other (non HMO) | Attending: Internal Medicine | Admitting: Rehabilitation

## 2018-01-27 ENCOUNTER — Encounter: Payer: Self-pay | Admitting: *Deleted

## 2018-01-27 VITALS — BP 170/99 | HR 90 | Temp 98.0°F | Resp 16 | Ht 69.0 in | Wt 239.0 lb

## 2018-01-27 DIAGNOSIS — N185 Chronic kidney disease, stage 5: Secondary | ICD-10-CM | POA: Diagnosis not present

## 2018-01-27 NOTE — Progress Notes (Signed)
Established Dialysis Access   History of Present Illness   Heather [name] is a 47 y.o. (10/16/1971) female who presents for re-evaluation for permanent access.  The patient is right hand dominant.  Previous access procedures have been completed in the left arm.  The patient's complication from previous access procedures include: thrombosis.  The patient has never had a previous PPM placed.  Patient prior reaction to the Artegraft made me concern she rejected the bovine graft.  Past Medical History:  Diagnosis Date  . Allergy   . Anemia of chronic disease   . B12 deficiency 05/10/2014  . Blood transfusion without reported diagnosis   . Bronchitis   . Chronic constipation   . Chronic kidney disease   . Constipation   . CVA (cerebral vascular accident) ([place]) 12/03/2017   nonhemorrhagic infarct-- inferior right cerebellum  . Diabetic neuropathy, painful ([place]) 07/13/2013   Stop lyrica due to cost & SE of drowsy. Gabapentin helps Stay off hydrocodone: pt states she thinks "she was addicted" to med. "I don't want that anymore."   . DM2 (diabetes mellitus, type 2) ([place])   . Dyspnea    with exertion  . Fibromyalgia   . Gastroparesis due to DM   . GERD (gastroesophageal reflux disease)   . Headache   . Heart murmur, systolic 73/01/2024   Grade 1/6 ejection murmur per [facility] notes.   . Hyperlipemia   . Hypertension   . IBS (irritable bowel syndrome)   . Infertility   . Leiomyoma of uterus   . Morbid obesity due to excess calories ([place]) 11/16/2016  . Neuropathy   . Obesity   . Pancreatitis   . 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 Right-sided low back pain without sciatica 12/08/2015  . Secondary hyperthyroidism 11/27/2013   Followed by [facility], notes reviewed 01/01/15. PTH had improved with oral vitamin D: 133 to 85. Last 155-probably not taking vit D.    [name] Upper airway cough syndrome 11/15/2016   rec off acei/ on pepcid hs 11/15/2016 >>>      Past Surgical History:  Procedure Laterality Date  . AV FISTULA PLACEMENT Left 05/09/2017   Procedure: INSERTION OF ARTERIOVENOUS (AV) GRAFT ARM (ARTEGRAFT);  Surgeon: Conrad La Luisa, MD;  Location: [place];  Service: Vascular;  Laterality: Left;  . BASCILIC VEIN TRANSPOSITION Left 08/31/2016   Procedure: BASILIC VEIN TRANSPOSITION FIRST STAGE;  Surgeon: Conrad Goldendale, MD;  Location: [place];  Service: Vascular;  Laterality: Left;  . FOOT SURGERY Right    t"ook bone out- maybe hammer toe"  . LIGATION OF ARTERIOVENOUS  FISTULA Left 05/09/2017   Procedure: LIGATION OF ARTERIOVENOUS  FISTULA;  Surgeon: Conrad [place], MD;  Location: [place];  Service: Vascular;  Laterality: Left;  . LOOP RECORDER INSERTION N/A 12/05/2017   Procedure: LOOP RECORDER INSERTION;  Surgeon: Evans Lance, MD;  Location: [place] [facility];  Service: Cardiovascular;  Laterality: N/A;  . MYOMECTOMY    . TEE WITHOUT CARDIOVERSION N/A 12/05/2017   Procedure: TRANSESOPHAGEAL ECHOCARDIOGRAM (TEE);  Surgeon: Sanda Klein, MD;  Location: [facility];  Service: Cardiovascular;  Laterality: N/A;  . VITRECTOMY Bilateral     Social History   Socioeconomic History  . Marital status: Married    Spouse name: Not on file  . Number of children: 1  . Years of education: college  . Highest education level: Not on file  Social Needs  . Financial resource strain: Not on file  .  Food insecurity - worry: Not on file  . Food insecurity - inability: Not on file  . Transportation needs - medical: Not on file  . Transportation needs - non-medical: Not on file  Occupational History  . Occupation: Armed forces technical officer: [employer]  Tobacco Use  . Smoking status: Never Smoker  . Smokeless tobacco: Never Used  Substance and Sexual Activity  . Alcohol use: No    Alcohol/week: 0.0 oz  . Drug use: No  . Sexual activity: Yes    Partners: Male    Birth control/protection: None  Other Topics Concern  . Not on file   Social History Narrative   Patient lives with fiance. She has 1 grown son. Works full-time, she Paramedic for attorney.   Caffeine Use: 2 cups daily; sodas occasionally    Family History  Problem Relation Age of Onset  . Colon polyps Mother   . Diabetes Mother   . Heart murmur Father   . Hypertension Father   . Diabetes Sister   . Kidney failure Sister   . Diabetes Brother   . Retinal degeneration Brother   . Heart murmur Son   . Stroke Paternal Grandmother   . Diabetes Sister     Current Outpatient Medications  Medication Sig Dispense Refill  . aspirin EC 81 MG tablet Take 1 tablet (81 mg total) by mouth daily. 30 tablet 0  . calcitRIOL (ROCALTROL) 0.25 MCG capsule Take 0.5 mcg by mouth daily.     . cetirizine (ZYRTEC) 5 MG tablet 1/2 po daily prn allergic rhinitis 24 tablet 0  . clopidogrel (PLAVIX) 75 MG tablet Take 1 tablet (75 mg total) by mouth daily. 90 tablet 3  . Continuous Blood Gluc Sensor ([place]) MISC by Does not apply route.    . diazepam (VALIUM) 5 MG tablet Take 1 tablet (5 mg total) by mouth every 8 (eight) hours as needed (for vertigo). 12 tablet 0  . epoetin alfa (EPOGEN,PROCRIT) 50932 UNIT/ML injection Inject 30,000 Units into the vein every 14 (fourteen) days.     . fluticasone (FLONASE) 50 MCG/ACT nasal spray Place 2 sprays daily into both nostrils. 48 g 1  . fluticasone (FLOVENT HFA) 220 MCG/ACT inhaler Inhale 2 puffs into the lungs daily. 1 Inhaler 12  . gabapentin (NEURONTIN) 300 MG capsule Take 1 capsule (300 mg total) by mouth 2 (two) times daily. 180 capsule 1  . insulin aspart (NOVOLOG) 100 UNIT/ML FlexPen Inject under the skin 15 minutes before meals: 6 units before Breakfast, 8 units before Lunch, 8 units before Supper plus scale BG-150/25 up to 60 units daily    . insulin degludec (TRESIBA FLEXTOUCH) 100 UNIT/ML SOPN FlexTouch Pen Inject 28 Units into the skin daily with breakfast.     . LINZESS 145 MCG CAPS capsule TAKE 1  CAPSULE (145 MCG TOTAL) BY MOUTH DAILY BEFORE BREAKFAST. 30 capsule 5  . ondansetron (ZOFRAN ODT) 8 MG disintegrating tablet Take 1 tablet (8 mg total) by mouth every 8 (eight) hours as needed for nausea or vomiting. 10 tablet 0  . ondansetron (ZOFRAN) 4 MG tablet Take 1 tablet (4 mg total) by mouth every 8 (eight) hours as needed for nausea or vomiting. 20 tablet 0  . sertraline (ZOLOFT) 25 MG tablet Take 1 tablet (25 mg total) by mouth daily. 90 tablet 0  . simvastatin (ZOCOR) 10 MG tablet Take 0.5 tablets (5 mg total) by mouth at bedtime. 1/2 po q hs -  GFR 14 30 tablet 0  . torsemide (DEMADEX) 20 MG tablet Take 1 tablet (20 mg total) by mouth 2 (two) times daily. 180 tablet 3   No current facility-administered medications for this visit.      Allergies  Allergen Reactions  . Ibuprofen Other (See Comments)    CKD stage 3. Should avoid.  . Dilaudid [Hydromorphone Hcl] Itching and Other (See Comments)    Can take with Benadryl.    REVIEW OF SYSTEMS (negative unless checked):   Cardiac:  []  Chest pain or chest pressure? []  Shortness of breath upon activity? []  Shortness of breath when lying flat? []  Irregular heart rhythm?  Vascular:  []  Pain in calf, thigh, or hip brought on by walking? []  Pain in feet at night that wakes you up from your sleep? []  Blood clot in your veins? []  Leg swelling?  Pulmonary:  []  Oxygen at home? []  Productive cough? []  Wheezing?  Neurologic:  []  Sudden weakness in arms or legs? []  Sudden numbness in arms or legs? []  Sudden onset of difficult speaking or slurred speech? []  Temporary loss of vision in one eye? []  Problems with dizziness?  Gastrointestinal:  []  Blood in stool? []  Vomited blood?  Genitourinary:  []  Burning when urinating? []  Blood in urine?  Psychiatric:  []  Major depression  Hematologic:  []  Bleeding problems? []  Problems with blood clotting?  Dermatologic:  []  Rashes or ulcers?  Constitutional:  []  Fever or  chills?  Ear/Nose/Throat:  []  Change in hearing? []  Nose bleeds? []  Sore throat?  Musculoskeletal:  []  Back pain? []  Joint pain? []  Muscle pain?   Physical Examination   Vitals:   01/27/18 1550  BP: (!) 170/99  Pulse: 90  Resp: 16  Temp: 98 F (36.7 C)  TempSrc: Oral  SpO2: 100%  Weight: 239 lb (108.4 kg)  Height: 5\' 9"  (1.753 m)   Body mass index is 35.29 kg/m.  General Alert, O x 3, WD, NAD  Pulmonary Sym exp, good B air movt, CTA B  Cardiac RRR, Nl S1, S2, no Murmurs, No rubs, No S3,S4  Vascular Vessel Right Left  Radial Palpable Palpable  Brachial Palpable Palpable  Ulnar Not palpable Not palpable    Musculo- skeletal M/S 5/5 throughout  , Extremities without ischemic changes    Neurologic Pain and light touch intact in extremities , Motor exam as listed above     Non-invasive Vascular Imaging    BUE Doppler (01/25/18):   R arm:   Brachial: tri, 4.5 mm  Radial: tri, 2.0 mm  Ulnar: tri, 2.3 mm  L arm:   Brachial: tri, 4.1 mm  Radial: tri, 1.7 mm  Ulnar: tri, 2.1 mm  BUE Vein Mapping  (01/25/18):   R arm: acceptable vein conduits include none  L arm: acceptable vein conduits include none   Medical Decision Making   Heather [name] is a 47 y.o. female who presents with chronic kidney disease stage V    Based on vein mapping and examination, this patient's permanent access options include: RUA AVG with PTFE  I had an extensive discussion with this patient in regards to the nature of access surgery, including risk, benefits, and alternatives.    The patient is aware that the risks of access surgery include but are not limited to: bleeding, infection, steal syndrome, nerve damage, ischemic monomelic neuropathy, failure of access to mature, and possible need for additional access procedures in the future.  The patient has agreed to proceed with the  above procedure which will be scheduled 22 FEB 19.   Adele Barthel, MD, FACS [facility] Office: [phone] Pager: [phone]

## 2018-01-27 NOTE — H&P (View-Only) (Signed)
Established Dialysis Access   History of Present Illness   Heather [name] is a 47 y.o. ([birth year]) female who presents for re-evaluation for permanent access.  The patient is right hand dominant.  Previous access procedures have been completed in the left arm.  The patient's complication from previous access procedures include: thrombosis.  The patient has never had a previous PPM placed.  Patient prior reaction to the Artegraft made me concern she rejected the bovine graft.  Past Medical History:  Diagnosis Date  . Allergy   . Anemia of chronic disease   . B12 deficiency 05/10/2014  . Blood transfusion without reported diagnosis   . Bronchitis   . Chronic constipation   . Chronic kidney disease   . Constipation   . CVA (cerebral vascular accident) ([place]) 12/03/2017   nonhemorrhagic infarct-- inferior right cerebellum  . Diabetic neuropathy, painful ([place]) 07/13/2013   Stop lyrica due to cost & SE of drowsy. Gabapentin helps Stay off hydrocodone: pt states she thinks "she was addicted" to med. "I don't want that anymore."   . DM2 (diabetes mellitus, type 2) ([place])   . Dyspnea    with exertion  . Fibromyalgia   . Gastroparesis due to DM   . GERD (gastroesophageal reflux disease)   . Headache   . Heart murmur, systolic 15/06/2619   Grade 1/6 ejection murmur per [facility] notes.   . Hyperlipemia   . Hypertension   . IBS (irritable bowel syndrome)   . Infertility   . Leiomyoma of uterus   . Morbid obesity due to excess calories ([place]) 11/16/2016  . Neuropathy   . Obesity   . Pancreatitis   . 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 Right-sided low back pain without sciatica 12/08/2015  . Secondary hyperthyroidism 11/27/2013   Followed by [facility], notes reviewed 01/01/15. PTH had improved with oral vitamin D: 133 to 85. Last 155-probably not taking vit D.    [name] Upper airway cough syndrome 11/15/2016   rec off acei/ on pepcid hs 11/15/2016 >>>      Past Surgical History:  Procedure Laterality Date  . AV FISTULA PLACEMENT Left 05/09/2017   Procedure: INSERTION OF ARTERIOVENOUS (AV) GRAFT ARM (ARTEGRAFT);  Surgeon: Conrad Bransford, MD;  Location: [place];  Service: Vascular;  Laterality: Left;  . BASCILIC VEIN TRANSPOSITION Left 08/31/2016   Procedure: BASILIC VEIN TRANSPOSITION FIRST STAGE;  Surgeon: Conrad [place], MD;  Location: [place];  Service: Vascular;  Laterality: Left;  . FOOT SURGERY Right    t"ook bone out- maybe hammer toe"  . LIGATION OF ARTERIOVENOUS  FISTULA Left 05/09/2017   Procedure: LIGATION OF ARTERIOVENOUS  FISTULA;  Surgeon: Conrad Erma, MD;  Location: [place];  Service: Vascular;  Laterality: Left;  . LOOP RECORDER INSERTION N/A 12/05/2017   Procedure: LOOP RECORDER INSERTION;  Surgeon: Evans Lance, MD;  Location: [place] [facility];  Service: Cardiovascular;  Laterality: N/A;  . MYOMECTOMY    . TEE WITHOUT CARDIOVERSION N/A 12/05/2017   Procedure: TRANSESOPHAGEAL ECHOCARDIOGRAM (TEE);  Surgeon: Sanda Klein, MD;  Location: [facility];  Service: Cardiovascular;  Laterality: N/A;  . VITRECTOMY Bilateral     Social History   Socioeconomic History  . Marital status: Married    Spouse name: Not on file  . Number of children: 1  . Years of education: college  . Highest education level: Not on file  Social Needs  . Financial resource strain: Not on file  .  Food insecurity - worry: Not on file  . Food insecurity - inability: Not on file  . Transportation needs - medical: Not on file  . Transportation needs - non-medical: Not on file  Occupational History  . Occupation: Armed forces technical officer: [employer]  Tobacco Use  . Smoking status: Never Smoker  . Smokeless tobacco: Never Used  Substance and Sexual Activity  . Alcohol use: No    Alcohol/week: 0.0 oz  . Drug use: No  . Sexual activity: Yes    Partners: Male    Birth control/protection: None  Other Topics Concern  . Not on file   Social History Narrative   Patient lives with fiance. She has 1 grown son. Works full-time, she Paramedic for attorney.   Caffeine Use: 2 cups daily; sodas occasionally    Family History  Problem Relation Age of Onset  . Colon polyps Mother   . Diabetes Mother   . Heart murmur Father   . Hypertension Father   . Diabetes Sister   . Kidney failure Sister   . Diabetes Brother   . Retinal degeneration Brother   . Heart murmur Son   . Stroke Paternal Grandmother   . Diabetes Sister     Current Outpatient Medications  Medication Sig Dispense Refill  . aspirin EC 81 MG tablet Take 1 tablet (81 mg total) by mouth daily. 30 tablet 0  . calcitRIOL (ROCALTROL) 0.25 MCG capsule Take 0.5 mcg by mouth daily.     . cetirizine (ZYRTEC) 5 MG tablet 1/2 po daily prn allergic rhinitis 24 tablet 0  . clopidogrel (PLAVIX) 75 MG tablet Take 1 tablet (75 mg total) by mouth daily. 90 tablet 3  . Continuous Blood Gluc Sensor ([place]) MISC by Does not apply route.    . diazepam (VALIUM) 5 MG tablet Take 1 tablet (5 mg total) by mouth every 8 (eight) hours as needed (for vertigo). 12 tablet 0  . epoetin alfa (EPOGEN,PROCRIT) 51884 UNIT/ML injection Inject 30,000 Units into the vein every 14 (fourteen) days.     . fluticasone (FLONASE) 50 MCG/ACT nasal spray Place 2 sprays daily into both nostrils. 48 g 1  . fluticasone (FLOVENT HFA) 220 MCG/ACT inhaler Inhale 2 puffs into the lungs daily. 1 Inhaler 12  . gabapentin (NEURONTIN) 300 MG capsule Take 1 capsule (300 mg total) by mouth 2 (two) times daily. 180 capsule 1  . insulin aspart (NOVOLOG) 100 UNIT/ML FlexPen Inject under the skin 15 minutes before meals: 6 units before Breakfast, 8 units before Lunch, 8 units before Supper plus scale BG-150/25 up to 60 units daily    . insulin degludec (TRESIBA FLEXTOUCH) 100 UNIT/ML SOPN FlexTouch Pen Inject 28 Units into the skin daily with breakfast.     . LINZESS 145 MCG CAPS capsule TAKE 1  CAPSULE (145 MCG TOTAL) BY MOUTH DAILY BEFORE BREAKFAST. 30 capsule 5  . ondansetron (ZOFRAN ODT) 8 MG disintegrating tablet Take 1 tablet (8 mg total) by mouth every 8 (eight) hours as needed for nausea or vomiting. 10 tablet 0  . ondansetron (ZOFRAN) 4 MG tablet Take 1 tablet (4 mg total) by mouth every 8 (eight) hours as needed for nausea or vomiting. 20 tablet 0  . sertraline (ZOLOFT) 25 MG tablet Take 1 tablet (25 mg total) by mouth daily. 90 tablet 0  . simvastatin (ZOCOR) 10 MG tablet Take 0.5 tablets (5 mg total) by mouth at bedtime. 1/2 po q hs -  GFR 14 30 tablet 0  . torsemide (DEMADEX) 20 MG tablet Take 1 tablet (20 mg total) by mouth 2 (two) times daily. 180 tablet 3   No current facility-administered medications for this visit.      Allergies  Allergen Reactions  . Ibuprofen Other (See Comments)    CKD stage 3. Should avoid.  . Dilaudid [Hydromorphone Hcl] Itching and Other (See Comments)    Can take with Benadryl.    REVIEW OF SYSTEMS (negative unless checked):   Cardiac:  []  Chest pain or chest pressure? []  Shortness of breath upon activity? []  Shortness of breath when lying flat? []  Irregular heart rhythm?  Vascular:  []  Pain in calf, thigh, or hip brought on by walking? []  Pain in feet at night that wakes you up from your sleep? []  Blood clot in your veins? []  Leg swelling?  Pulmonary:  []  Oxygen at home? []  Productive cough? []  Wheezing?  Neurologic:  []  Sudden weakness in arms or legs? []  Sudden numbness in arms or legs? []  Sudden onset of difficult speaking or slurred speech? []  Temporary loss of vision in one eye? []  Problems with dizziness?  Gastrointestinal:  []  Blood in stool? []  Vomited blood?  Genitourinary:  []  Burning when urinating? []  Blood in urine?  Psychiatric:  []  Major depression  Hematologic:  []  Bleeding problems? []  Problems with blood clotting?  Dermatologic:  []  Rashes or ulcers?  Constitutional:  []  Fever or  chills?  Ear/Nose/Throat:  []  Change in hearing? []  Nose bleeds? []  Sore throat?  Musculoskeletal:  []  Back pain? []  Joint pain? []  Muscle pain?   Physical Examination   Vitals:   01/27/18 1550  BP: (!) 170/99  Pulse: 90  Resp: 16  Temp: 98 F (36.7 C)  TempSrc: Oral  SpO2: 100%  Weight: 239 lb (108.4 kg)  Height: 5\' 9"  (1.753 m)   Body mass index is 35.29 kg/m.  General Alert, O x 3, WD, NAD  Pulmonary Sym exp, good B air movt, CTA B  Cardiac RRR, Nl S1, S2, no Murmurs, No rubs, No S3,S4  Vascular Vessel Right Left  Radial Palpable Palpable  Brachial Palpable Palpable  Ulnar Not palpable Not palpable    Musculo- skeletal M/S 5/5 throughout  , Extremities without ischemic changes    Neurologic Pain and light touch intact in extremities , Motor exam as listed above     Non-invasive Vascular Imaging    BUE Doppler (01/25/18):   R arm:   Brachial: tri, 4.5 mm  Radial: tri, 2.0 mm  Ulnar: tri, 2.3 mm  L arm:   Brachial: tri, 4.1 mm  Radial: tri, 1.7 mm  Ulnar: tri, 2.1 mm  BUE Vein Mapping  (01/25/18):   R arm: acceptable vein conduits include none  L arm: acceptable vein conduits include none   Medical Decision Making   Heather [name] is a 47 y.o. female who presents with chronic kidney disease stage V    Based on vein mapping and examination, this patient's permanent access options include: RUA AVG with PTFE  I had an extensive discussion with this patient in regards to the nature of access surgery, including risk, benefits, and alternatives.    The patient is aware that the risks of access surgery include but are not limited to: bleeding, infection, steal syndrome, nerve damage, ischemic monomelic neuropathy, failure of access to mature, and possible need for additional access procedures in the future.  The patient has agreed to proceed with the  above procedure which will be scheduled 22 FEB 19.   Heather Barthel, MD, FACS [facility] Office: [phone] Pager: [phone]

## 2018-01-30 ENCOUNTER — Other Ambulatory Visit: Payer: Self-pay | Admitting: *Deleted

## 2018-01-30 ENCOUNTER — Ambulatory Visit: Payer: Managed Care, Other (non HMO) | Admitting: Rehabilitation

## 2018-01-30 NOTE — Progress Notes (Signed)
Patient was instructed to hold Plavix 5 days pre-op>

## 2018-01-31 ENCOUNTER — Encounter (HOSPITAL_COMMUNITY)
Admission: RE | Admit: 2018-01-31 | Discharge: 2018-01-31 | Disposition: A | Discharge status: Home / Self Care | Payer: Managed Care, Other (non HMO) | Source: Ambulatory Visit | Attending: Nephrology | Admitting: Nephrology

## 2018-01-31 VITALS — BP 188/97 | HR 101 | Temp 98.2°F | Resp 18

## 2018-01-31 DIAGNOSIS — N185 Chronic kidney disease, stage 5: Secondary | ICD-10-CM

## 2018-01-31 DIAGNOSIS — N182 Chronic kidney disease, stage 2 (mild): Secondary | ICD-10-CM | POA: Diagnosis present

## 2018-01-31 LAB — POCT HEMOGLOBIN-HEMACUE: Hemoglobin: 10.1 g/dL — ABNORMAL LOW (ref 12.0–15.0)

## 2018-01-31 MED ORDER — EPOETIN ALFA 40000 UNIT/ML IJ SOLN
INTRAMUSCULAR | Status: AC
  Filled 2018-01-31: qty 1

## 2018-01-31 MED ORDER — EPOETIN ALFA 40000 UNIT/ML IJ SOLN
40000.0000 [IU] | INTRAMUSCULAR | Status: DC
  Administered 2018-01-31: 40000 [IU] via SUBCUTANEOUS

## 2018-01-31 NOTE — Progress Notes (Signed)
Two phlebotomists tried 5 times to draw patient's labs today for renal and hemocue.  Only able to obtain enough blood today for hemocue level.  Called and left Erline Levine a message at [facility] letting her know, and stating that she had an appt with them tomorrow at 3:45 and asked if they could try to get the renal panel at that appt.

## 2018-02-01 ENCOUNTER — Encounter: Payer: Self-pay | Admitting: Nephrology

## 2018-02-01 LAB — CBC AND DIFFERENTIAL
HCT: 29 — AB (ref 36–46)
Hemoglobin: 9.3 — AB (ref 12.0–16.0)
NEUTROS ABS: 4
Platelets: 300 (ref 150–399)
WBC: 6.5

## 2018-02-01 LAB — BASIC METABOLIC PANEL
BUN: 36 — AB (ref 4–21)
CREATININE: 4.4 — AB (ref 0.5–1.1)
Glucose: 174
Potassium: 4.2 (ref 3.4–5.3)
SODIUM: 136 — AB (ref 137–147)

## 2018-02-03 ENCOUNTER — Other Ambulatory Visit: Payer: Self-pay

## 2018-02-03 ENCOUNTER — Ambulatory Visit: Payer: Managed Care, Other (non HMO) | Admitting: Physical Therapy

## 2018-02-03 ENCOUNTER — Ambulatory Visit (INDEPENDENT_AMBULATORY_CARE_PROVIDER_SITE_OTHER): Payer: Managed Care, Other (non HMO) | Admitting: *Deleted

## 2018-02-03 DIAGNOSIS — I639 Cerebral infarction, unspecified: Secondary | ICD-10-CM | POA: Diagnosis not present

## 2018-02-03 MED ORDER — SIMVASTATIN 10 MG PO TABS: 5.0000 mg | ORAL_TABLET | Freq: Every day | ORAL | 0 refills | Status: DC

## 2018-02-06 ENCOUNTER — Ambulatory Visit: Payer: Managed Care, Other (non HMO) | Admitting: Rehabilitation

## 2018-02-06 ENCOUNTER — Other Ambulatory Visit: Payer: Self-pay

## 2018-02-06 MED ORDER — SIMVASTATIN 10 MG PO TABS: 5.0000 mg | ORAL_TABLET | Freq: Every day | ORAL | 1 refills | Status: DC

## 2018-02-06 NOTE — Progress Notes (Signed)
[facility] Summary Report / Loop Recorder 

## 2018-02-07 ENCOUNTER — Telehealth: Payer: Self-pay | Admitting: *Deleted

## 2018-02-07 NOTE — Telephone Encounter (Signed)
-----   Message from Briscoe Deutscher, DO sent at 01/31/2018  3:19 PM EST ----- Regarding: RE: Medication Clearance Yes, please have her come in for a preoperative discussion and so that I can document that she understands the benefit v risk. High risk.   EW ----- Message ----- From: Durwin Glaze, CMA Sent: 01/31/2018  10:33 AM To: Briscoe Deutscher, DO Subject: FW: Medication Clearance                       Spoke with Ms. Scott and she stated that they did not schedule a follow up with her to see Cardiology. Do you want her to come in to see you to discuss the risk of going off the Plavix.  ----- Message ----- From: Briscoe Deutscher, DO Sent: 01/30/2018   4:52 PM To: Durwin Glaze, CMA Subject: FW: Medication Clearance                       Cardiology would be more appropriate to clear. If there is no other option, I will do it but must be able to document the patient's understanding of risks versus benefits. EW ----- Message ----- From: Willy Eddy, RN Sent: 01/30/2018   9:29 AM To: Briscoe Deutscher, DO Subject: Medication Clearance                           Patient is scheduled for A/V Graft 02/17/18 with Dr, Adele Barthel . Will need to hold Plavix for 5 days pre-op. Requesting Clearance. Thank you, Archivist

## 2018-02-10 ENCOUNTER — Ambulatory Visit: Payer: Managed Care, Other (non HMO) | Admitting: Rehabilitation

## 2018-02-13 ENCOUNTER — Ambulatory Visit: Payer: Managed Care, Other (non HMO) | Admitting: Family Medicine

## 2018-02-13 ENCOUNTER — Telehealth: Payer: Self-pay | Admitting: Internal Medicine

## 2018-02-13 ENCOUNTER — Telehealth: Payer: Self-pay | Admitting: *Deleted

## 2018-02-13 ENCOUNTER — Other Ambulatory Visit (HOSPITAL_COMMUNITY): Payer: Self-pay | Admitting: *Deleted

## 2018-02-13 NOTE — Telephone Encounter (Signed)
I have message Dr. Juleen China several time re: medication clearance for Plavix. She deferred clearance to Dr. Taylor/Cardiology. (See their note) Will proceed with surgery and hold Plavix for 5 days per [facility] protocol.

## 2018-02-13 NOTE — Telephone Encounter (Signed)
   Primary Cardiologist:Gregg Lovena Le, MD  Chart reviewed as part of pre-operative protocol coverage.   Patient was admitted with stroke in 11/2017  Dr. Lovena Le consulted for placement of loop recorder to look for occult atrial fib. TEE was negative for clot. Neurology recommended to start aspirin 325mg  daily although it was not on her discharge med list in the discharge summary nor on her AVS.  Her loop recorder is being followed regularly by our office, last device check 01/04/18, with last transmission 02/03/18 awaiting review by MD. No atrial fib seen thus far.  When seen by family medicine 01/12/18, Dr. Juleen China prescribed aspirin and Plavix for history of stroke.  Plavix was not started through our office and she does not appear to be on this medication for any cardiac reasons. Therefore, it is not appropriate for cardiology to weigh in regarding cessation.  Callback staff - please call requesting party and ask that they speak with the provider prescribing this medication, rather than our office. Will cc to Dr. Juleen China.  Charlie Pitter, PA-C 02/13/2018, 1:56 PM

## 2018-02-13 NOTE — Telephone Encounter (Signed)
Called Dr. Lianne Moris office and spoke with Zigmund Daniel.  I have let her know that they would need to reach out to Dr. Alcario Drought office for clearance for pt to come off Plavix since he is the one that prescribed it.  She advised that they are the ones that told them to send the clearance over to [name], but I did advise her that we didn't prescribe it and that pt has it for hx of stroke.  She thanked me for the call.

## 2018-02-13 NOTE — Telephone Encounter (Signed)
Plavix prescribed for cerebrovascular condition by me at hospital follow up as she had not been prescribed ASA or Plavix for secondary prevention. Plavix okay to hold for procedure.   Note: Intent is not to continue Plavix after 90 days/after okay to stop by Neurology.

## 2018-02-13 NOTE — Telephone Encounter (Signed)
New message   Needs response today!      [facility] Pre-operative Risk Assessment    Request for surgical clearance:  1. What type of surgery is being performed? AV Graft  2. When is this surgery scheduled? 02/17/18  3. What type of clearance is required (medical clearance vs. Pharmacy clearance to hold med vs. Both)? Pharmacy  4. Are there any medications that need to be held prior to surgery and how long?plavix  5. Practice name and name of physician performing surgery? Dr [name]  6. What is your office phone and fax number?  [phone] fax [phone]  7. Anesthesia type (None, local, MAC, general) ?  MAC    Heather [name] 02/13/2018, 9:31 AM  _________________________________________________________________   (provider comments below)

## 2018-02-13 NOTE — Telephone Encounter (Signed)
Follow up call to Dr. Hillery Jacks office for medical clearance to hold Plavix for surgery on 02/17/18. Msg. Left with Romie Minus to call this office with clearance today.

## 2018-02-14 ENCOUNTER — Encounter: Payer: Self-pay | Admitting: *Deleted

## 2018-02-14 ENCOUNTER — Encounter (HOSPITAL_COMMUNITY)
Admission: RE | Admit: 2018-02-14 | Discharge: 2018-02-14 | Disposition: A | Discharge status: Home / Self Care | Payer: Managed Care, Other (non HMO) | Source: Ambulatory Visit | Attending: Nephrology | Admitting: Nephrology

## 2018-02-14 ENCOUNTER — Other Ambulatory Visit: Payer: Self-pay

## 2018-02-14 VITALS — BP 162/94 | HR 79 | Temp 98.3°F | Resp 18

## 2018-02-14 DIAGNOSIS — N182 Chronic kidney disease, stage 2 (mild): Secondary | ICD-10-CM | POA: Diagnosis not present

## 2018-02-14 DIAGNOSIS — I639 Cerebral infarction, unspecified: Secondary | ICD-10-CM

## 2018-02-14 LAB — FERRITIN: FERRITIN: 297 ng/mL (ref 11–307)

## 2018-02-14 LAB — IRON AND TIBC
IRON: 37 ug/dL (ref 28–170)
Saturation Ratios: 16 % (ref 10.4–31.8)
TIBC: 230 ug/dL — ABNORMAL LOW (ref 250–450)
UIBC: 193 ug/dL

## 2018-02-14 LAB — POCT HEMOGLOBIN-HEMACUE: Hemoglobin: 10 g/dL — ABNORMAL LOW (ref 12.0–15.0)

## 2018-02-14 MED ORDER — EPOETIN ALFA 40000 UNIT/ML IJ SOLN
40000.0000 [IU] | INTRAMUSCULAR | Status: DC
  Administered 2018-02-14: 40000 [IU] via SUBCUTANEOUS

## 2018-02-14 MED ORDER — EPOETIN ALFA 40000 UNIT/ML IJ SOLN
INTRAMUSCULAR | Status: AC
  Filled 2018-02-14: qty 1

## 2018-02-14 MED ORDER — ASPIRIN EC 81 MG PO TBEC: 81.0000 mg | DELAYED_RELEASE_TABLET | Freq: Every day | ORAL | 0 refills | Status: DC

## 2018-02-16 ENCOUNTER — Other Ambulatory Visit: Payer: Self-pay

## 2018-02-16 ENCOUNTER — Encounter (HOSPITAL_COMMUNITY): Payer: Self-pay | Admitting: *Deleted

## 2018-02-16 NOTE — Progress Notes (Signed)
Pt denies any acute cardiopulmonary issues. Pt under the care of Dr. Lovena Le, Cardiology. Pt denies having a cardiac cath but stated that a stress test was performed " around 8 years ago." Pt stated that last dose of Plavix was " 5 days ago, around the weekend."  Pt made aware to check BG every 2 hours prior to arrival to hospital on DOS. Pt made aware to treat a BG < 70 with 4 ounces of apple juice, wait 15 minutes after intervention to recheck BG, if BG remains < 70, call Short Stay unit to speak with a nurse. Pt made aware to take 1/2 dose of correction Novolog insulin for BG > 220. Pt verbalized understanding of all pre-op instructions. See anesthesia note.

## 2018-02-16 NOTE — Progress Notes (Addendum)
Anesthesia Chart Review: SAME DAY WORK-UP.  Patient is a 47 year old female scheduled for insertion of arteriovenous Gore-Tex graft, right upper arm on 02/17/2018 by Dr. Adele Barthel.  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  - Admitted 12/02/17-12/06/17 with severe headache. BP 200/114 on presentation (to [facility]) then down to 177/103. Evaluation revealed no neurological deficits, but head CT showed subacute chronic right cerebellar infarction. She was transported to [facility] for further evaluation. Carotid U/S and echo did not identify etiology. Loop recorder implanted. ASA and statin recommended (but she did not go home on ASA, so PCP started Plavix at hospital f/u).   - PCP is Dr. Briscoe Deutscher with [facility]. She had prescribed Plavix for CVA. She gave permission to hold for hemodialysis access procedure.  - Nephrologist is Dr. Jeneen Rinks [place]. - Neurologist is Dr. Andrey Spearman (saw Dr. Leonie Man while hospitalized).  - EP cardiologist is Dr. Cristopher Peru (for loop recorder). Scarlette Slice, PA-C contacted by [facility] regarding clearance to hold Plavix, but deferred to prescribing provider. By her notes, last device check 01/04/18 with last transmission 02/03/18 with no afib seen thus far.  - Endocrinologist is Dr. Jovita Kussmaul, last visit 01/10/18 ([facility]).  Meds include amlodipine, aspirin 81 mg (started 02/14/18), Coreg, Plavix (holding 5 days prior to surgery), Lasix, Neurontin, Novolog, Tresiba, Zocor. Procrit 02/14/18.  [name] a same day work-up, so I do not have vitals. BP 170/99 on 01/27/18 (Dr. [name]), 178/94 (not taken meds yet) 01/12/18 (Dr.  Juleen China), 142/86 01/10/18 (Dr. Maia Petties).  EKG 12/02/17: NSR.  TEE 12/05/17: Study Conclusions - Left ventricle: The cavity size was normal. There was mild   concentric hypertrophy. Systolic function was normal. The   estimated ejection fraction was in the range of 60% to 65%. Wall   motion was normal; there were no regional wall motion   abnormalities. - Aortic valve: No evidence of vegetation. - Mitral valve: No evidence of vegetation. - Left atrium: No evidence of thrombus in the atrial cavity or   appendage. No spontaneous echo contrast was observed. - Right atrium: No evidence of thrombus in the atrial cavity or   appendage. - Atrial septum: No defect or patent foramen ovale was identified.   Echo contrast study showed no right-to-left atrial level shunt,   following an increase in RA pressure induced by provocative   maneuvers. - Tricuspid valve: No evidence of vegetation. - Pulmonic valve: No evidence of vegetation.  Stress Echo 04/19/16 ([place]): SUMMARY Dr. Dannielle Karvonen will call back if another stress test is needed. The patient had no chest pain. The patient achieved 76 % of maximum predicted heart rate. The METS achieved was 5. Exercise capacity was poor. Negative exercise echocardiography for inducible ischemia at subtarget heart  rate achieved. Nondiagnostic exercise echocardiography due to subtarget heart rate. (According to discharge summary, "It was felt that the pain was likely not cardiac and a decision was made not to do more evaluation for CAD, considering CKD and the risk of contrast use with cath.")  Nuclear stress test 04/23/15 [facility]): Impression: 1.  No reversible ischemia or infarction. 2.  Normal left ventricular wall motion.  Carotid U/S 12/03/17: Final Interpretation: Right Carotid: The extracranial vessels were near-normal with only minimal  wall        thickening or plaque. Left Carotid: The extracranial vessels were  near-normal with only minimal wall       thickening or plaque. Vertebrals: Both vertebral arteries were patent with antegrade flow.  MRI/MRA head 01/12/18: IMPRESSION: MRI HEAD: 1. No acute intracranial process. 2. Old RIGHT inferior cerebellar/PCA territory infarct. 3. Mild to moderate white matter changes seen with chronic small vessel ischemic disease. 4. Mild-to-moderate parenchymal brain volume loss for age. MRA HEAD: 1. No emergent large vessel occlusion or flow limiting stenosis. 2. Moderate stenosis LEFT paraclinoid ICA.  CT head 12/02/17: IMPRESSION: 1. New age-indeterminate infarct within the right cerebellum, potentially subacute. Consider MRI for further evaluation as clinically indicated. (12/03/17 MRI: subacute inferior right cerebellar nonhemorrhagic infarct.)  [name] for labs on the day of surgery.   Above reviewed with anesthesiologist Dr. Belinda Block who recommended contacting Dr. Juleen China to clarify if she felt we would need to contact neurology since Plavix is on hold. (It does look like she prescribed ASA 81 mg to start on 02/14/18 though.) I contacted Dr. Alcario Drought office and was told she was in clinic today and best to send staff message which I did (urgent message sent at 2:30 PM). If I hear back from Dr. Juleen China prior to patient's surgery then I will plan to update; otherwise, if anesthesiologist has any additional concerns then he/she can contact Dr. Juleen China or Dr. Leonie Man tomorrow. Patient will get her BP checked on arrival.  [facility] Short Stay Center/Anesthesiology Phone [phone] 02/16/2018 5:00 PM

## 2018-02-17 ENCOUNTER — Encounter (HOSPITAL_COMMUNITY)
Admission: AD | Disposition: A | Discharge status: Home / Self Care | Payer: Self-pay | Source: Home / Self Care | Attending: Vascular Surgery

## 2018-02-17 ENCOUNTER — Ambulatory Visit (HOSPITAL_COMMUNITY): Payer: Managed Care, Other (non HMO) | Admitting: Certified Registered Nurse Anesthetist

## 2018-02-17 ENCOUNTER — Telehealth: Payer: Self-pay | Admitting: Vascular Surgery

## 2018-02-17 ENCOUNTER — Ambulatory Visit (HOSPITAL_COMMUNITY): Payer: Managed Care, Other (non HMO) | Admitting: Vascular Surgery

## 2018-02-17 ENCOUNTER — Inpatient Hospital Stay (HOSPITAL_COMMUNITY)
Admission: AD | Admit: 2018-02-17 | Discharge: 2018-02-20 | DRG: 673 | Disposition: A | Discharge status: Home / Self Care | Payer: Managed Care, Other (non HMO) | Attending: Vascular Surgery | Admitting: Vascular Surgery

## 2018-02-17 ENCOUNTER — Encounter (HOSPITAL_COMMUNITY): Payer: Self-pay | Admitting: Certified Registered Nurse Anesthetist

## 2018-02-17 DIAGNOSIS — E785 Hyperlipidemia, unspecified: Secondary | ICD-10-CM | POA: Diagnosis present

## 2018-02-17 DIAGNOSIS — Z888 Allergy status to other drugs, medicaments and biological substances status: Secondary | ICD-10-CM

## 2018-02-17 DIAGNOSIS — Z79899 Other long term (current) drug therapy: Secondary | ICD-10-CM

## 2018-02-17 DIAGNOSIS — Z8249 Family history of ischemic heart disease and other diseases of the circulatory system: Secondary | ICD-10-CM

## 2018-02-17 DIAGNOSIS — Z992 Dependence on renal dialysis: Secondary | ICD-10-CM | POA: Diagnosis not present

## 2018-02-17 DIAGNOSIS — Y828 Other medical devices associated with adverse incidents: Secondary | ICD-10-CM | POA: Diagnosis present

## 2018-02-17 DIAGNOSIS — M79641 Pain in right hand: Secondary | ICD-10-CM | POA: Diagnosis present

## 2018-02-17 DIAGNOSIS — Z885 Allergy status to narcotic agent status: Secondary | ICD-10-CM | POA: Diagnosis not present

## 2018-02-17 DIAGNOSIS — T82898A Other specified complication of vascular prosthetic devices, implants and grafts, initial encounter: Secondary | ICD-10-CM | POA: Diagnosis present

## 2018-02-17 DIAGNOSIS — Z794 Long term (current) use of insulin: Secondary | ICD-10-CM | POA: Diagnosis not present

## 2018-02-17 DIAGNOSIS — I120 Hypertensive chronic kidney disease with stage 5 chronic kidney disease or end stage renal disease: Secondary | ICD-10-CM | POA: Diagnosis present

## 2018-02-17 DIAGNOSIS — Z8673 Personal history of transient ischemic attack (TIA), and cerebral infarction without residual deficits: Secondary | ICD-10-CM

## 2018-02-17 DIAGNOSIS — K219 Gastro-esophageal reflux disease without esophagitis: Secondary | ICD-10-CM | POA: Diagnosis present

## 2018-02-17 DIAGNOSIS — I5030 Unspecified diastolic (congestive) heart failure: Secondary | ICD-10-CM | POA: Diagnosis not present

## 2018-02-17 DIAGNOSIS — E1122 Type 2 diabetes mellitus with diabetic chronic kidney disease: Secondary | ICD-10-CM | POA: Diagnosis present

## 2018-02-17 DIAGNOSIS — F329 Major depressive disorder, single episode, unspecified: Secondary | ICD-10-CM | POA: Diagnosis present

## 2018-02-17 DIAGNOSIS — N186 End stage renal disease: Secondary | ICD-10-CM | POA: Diagnosis present

## 2018-02-17 DIAGNOSIS — Z833 Family history of diabetes mellitus: Secondary | ICD-10-CM

## 2018-02-17 DIAGNOSIS — Z841 Family history of disorders of kidney and ureter: Secondary | ICD-10-CM

## 2018-02-17 DIAGNOSIS — Y92239 Unspecified place in hospital as the place of occurrence of the external cause: Secondary | ICD-10-CM | POA: Diagnosis present

## 2018-02-17 DIAGNOSIS — Z6835 Body mass index (BMI) 35.0-35.9, adult: Secondary | ICD-10-CM | POA: Diagnosis not present

## 2018-02-17 DIAGNOSIS — I998 Other disorder of circulatory system: Secondary | ICD-10-CM | POA: Diagnosis not present

## 2018-02-17 DIAGNOSIS — N185 Chronic kidney disease, stage 5: Secondary | ICD-10-CM | POA: Diagnosis not present

## 2018-02-17 DIAGNOSIS — Z7902 Long term (current) use of antithrombotics/antiplatelets: Secondary | ICD-10-CM

## 2018-02-17 HISTORY — PX: ARTERY REPAIR: SHX559

## 2018-02-17 HISTORY — DX: Pneumonia, unspecified organism: J18.9

## 2018-02-17 HISTORY — DX: Other specified complication of vascular prosthetic devices, implants and grafts, initial encounter: T82.898A

## 2018-02-17 HISTORY — PX: AV FISTULA PLACEMENT: SHX1204

## 2018-02-17 HISTORY — PX: REMOVAL OF GRAFT: SHX6361

## 2018-02-17 LAB — GLUCOSE, CAPILLARY
GLUCOSE-CAPILLARY: 191 mg/dL — AB (ref 65–99)
GLUCOSE-CAPILLARY: 160 mg/dL — AB (ref 65–99)
GLUCOSE-CAPILLARY: 130 mg/dL — AB (ref 65–99)
Glucose-Capillary: 151 mg/dL — ABNORMAL HIGH (ref 65–99)
Glucose-Capillary: 190 mg/dL — ABNORMAL HIGH (ref 65–99)
Glucose-Capillary: 186 mg/dL — ABNORMAL HIGH (ref 65–99)

## 2018-02-17 LAB — CBC
HCT: 31.8 % — ABNORMAL LOW (ref 36.0–46.0)
Hemoglobin: 9.7 g/dL — ABNORMAL LOW (ref 12.0–15.0)
MCH: 29.6 pg (ref 26.0–34.0)
MCHC: 30.5 g/dL (ref 30.0–36.0)
MCV: 97 fL (ref 78.0–100.0)
Platelets: 326 10*3/uL (ref 150–400)
RBC: 3.28 MIL/uL — ABNORMAL LOW (ref 3.87–5.11)
RDW: 13.6 % (ref 11.5–15.5)
WBC: 8.4 10*3/uL (ref 4.0–10.5)

## 2018-02-17 LAB — POCT I-STAT 4, (NA,K, GLUC, HGB,HCT)
GLUCOSE: 169 mg/dL — AB (ref 65–99)
HCT: 33 % — ABNORMAL LOW (ref 36.0–46.0)
Hemoglobin: 11.2 g/dL — ABNORMAL LOW (ref 12.0–15.0)
POTASSIUM: 4.2 mmol/L (ref 3.5–5.1)
Sodium: 138 mmol/L (ref 135–145)

## 2018-02-17 LAB — HCG, SERUM, QUALITATIVE: PREG SERUM: NEGATIVE

## 2018-02-17 SURGERY — REPAIR, ARTERY, BRACHIAL
Anesthesia: General | Site: Arm Upper | Laterality: Right

## 2018-02-17 SURGERY — REPAIR, ARTERY, BRACHIAL
Anesthesia: General | Laterality: Right

## 2018-02-17 SURGERY — INSERTION OF ARTERIOVENOUS (AV) GORE-TEX GRAFT ARM
Anesthesia: General | Site: Arm Upper | Laterality: Right

## 2018-02-17 MED ORDER — FENTANYL CITRATE (PF) 250 MCG/5ML IJ SOLN
INTRAMUSCULAR | Status: AC
  Filled 2018-02-17: qty 5

## 2018-02-17 MED ORDER — HEPARIN SODIUM (PORCINE) 1000 UNIT/ML IJ SOLN
INTRAMUSCULAR | Status: AC
  Filled 2018-02-17: qty 2

## 2018-02-17 MED ORDER — FENTANYL CITRATE (PF) 250 MCG/5ML IJ SOLN
INTRAMUSCULAR | Status: DC | PRN
  Administered 2018-02-17: 50 ug via INTRAVENOUS

## 2018-02-17 MED ORDER — LIDOCAINE-EPINEPHRINE (PF) 1 %-1:200000 IJ SOLN
INTRAMUSCULAR | Status: AC
  Filled 2018-02-17: qty 30

## 2018-02-17 MED ORDER — SODIUM CHLORIDE 0.9 % IV SOLN
INTRAVENOUS | Status: DC
  Administered 2018-02-17 (×2): via INTRAVENOUS

## 2018-02-17 MED ORDER — SODIUM CHLORIDE 0.9 % IV SOLN
1.5000 g | Freq: Two times a day (BID) | INTRAVENOUS | Status: DC
  Administered 2018-02-17 – 2018-02-18 (×2): 1.5 g via INTRAVENOUS
  Filled 2018-02-17 (×2): qty 1.5

## 2018-02-17 MED ORDER — HEMOSTATIC AGENTS (NO CHARGE) OPTIME
TOPICAL | Status: DC | PRN
  Administered 2018-02-17: 1 via TOPICAL

## 2018-02-17 MED ORDER — AMLODIPINE BESYLATE 10 MG PO TABS
10.0000 mg | ORAL_TABLET | Freq: Every day | ORAL | Status: DC
  Administered 2018-02-17 – 2018-02-19 (×3): 10 mg via ORAL
  Filled 2018-02-17 (×3): qty 1

## 2018-02-17 MED ORDER — LABETALOL HCL 5 MG/ML IV SOLN: 10.0000 mg | INTRAVENOUS | Status: DC | PRN

## 2018-02-17 MED ORDER — PROPOFOL 10 MG/ML IV BOLUS
INTRAVENOUS | Status: DC | PRN
  Administered 2018-02-17: 150 mg via INTRAVENOUS

## 2018-02-17 MED ORDER — ARTIFICIAL TEARS OPHTHALMIC OINT
TOPICAL_OINTMENT | OPHTHALMIC | Status: AC
  Filled 2018-02-17: qty 3.5

## 2018-02-17 MED ORDER — GLYCOPYRROLATE 0.2 MG/ML IJ SOLN
INTRAMUSCULAR | Status: DC | PRN
  Administered 2018-02-17: 0.2 mg via INTRAVENOUS

## 2018-02-17 MED ORDER — MIDAZOLAM HCL 2 MG/2ML IJ SOLN
INTRAMUSCULAR | Status: AC
  Filled 2018-02-17: qty 2

## 2018-02-17 MED ORDER — PROPOFOL 500 MG/50ML IV EMUL
INTRAVENOUS | Status: DC | PRN
  Administered 2018-02-17: 25 ug/kg/min via INTRAVENOUS

## 2018-02-17 MED ORDER — ASPIRIN EC 81 MG PO TBEC
81.0000 mg | DELAYED_RELEASE_TABLET | Freq: Every day | ORAL | Status: DC
  Administered 2018-02-17 – 2018-02-20 (×4): 81 mg via ORAL
  Filled 2018-02-17 (×5): qty 1

## 2018-02-17 MED ORDER — SODIUM CHLORIDE 0.9 % IV SOLN
INTRAVENOUS | Status: DC | PRN
  Administered 2018-02-17: 12:00:00

## 2018-02-17 MED ORDER — SODIUM CHLORIDE 0.9 % IV SOLN
INTRAVENOUS | Status: AC
  Filled 2018-02-17: qty 1.5

## 2018-02-17 MED ORDER — CARVEDILOL 12.5 MG PO TABS
ORAL_TABLET | ORAL | Status: AC
  Administered 2018-02-17: 12.5 mg
  Filled 2018-02-17: qty 1

## 2018-02-17 MED ORDER — CHLORHEXIDINE GLUCONATE 4 % EX LIQD: 60.0000 mL | Freq: Once | CUTANEOUS | Status: DC

## 2018-02-17 MED ORDER — OXYCODONE-ACETAMINOPHEN 5-325 MG PO TABS: 1.0000 | ORAL_TABLET | Freq: Four times a day (QID) | ORAL | 0 refills | Status: DC | PRN

## 2018-02-17 MED ORDER — GABAPENTIN 300 MG PO CAPS
300.0000 mg | ORAL_CAPSULE | Freq: Two times a day (BID) | ORAL | Status: DC
  Administered 2018-02-17 – 2018-02-20 (×6): 300 mg via ORAL
  Filled 2018-02-17 (×6): qty 1

## 2018-02-17 MED ORDER — ALUM & MAG HYDROXIDE-SIMETH 200-200-20 MG/5ML PO SUSP: 15.0000 mL | ORAL | Status: DC | PRN

## 2018-02-17 MED ORDER — ALBUTEROL SULFATE HFA 108 (90 BASE) MCG/ACT IN AERS
INHALATION_SPRAY | RESPIRATORY_TRACT | Status: DC | PRN
  Administered 2018-02-17: 2 via RESPIRATORY_TRACT

## 2018-02-17 MED ORDER — GUAIFENESIN-DM 100-10 MG/5ML PO SYRP
15.0000 mL | ORAL_SOLUTION | ORAL | Status: DC | PRN
  Administered 2018-02-19: 15 mL via ORAL
  Filled 2018-02-17: qty 15

## 2018-02-17 MED ORDER — CEFUROXIME SODIUM 750 MG IJ SOLR
INTRAMUSCULAR | Status: AC
  Filled 2018-02-17: qty 1500

## 2018-02-17 MED ORDER — PHENOL 1.4 % MT LIQD: 1.0000 | OROMUCOSAL | Status: DC | PRN

## 2018-02-17 MED ORDER — ARTIFICIAL TEARS OPHTHALMIC OINT
TOPICAL_OINTMENT | OPHTHALMIC | Status: DC | PRN
  Administered 2018-02-17: 1 via OPHTHALMIC

## 2018-02-17 MED ORDER — LIDOCAINE HCL (PF) 1 % IJ SOLN
INTRAMUSCULAR | Status: AC
  Filled 2018-02-17: qty 30

## 2018-02-17 MED ORDER — LIDOCAINE 2% (20 MG/ML) 5 ML SYRINGE
INTRAMUSCULAR | Status: AC
  Filled 2018-02-17: qty 5

## 2018-02-17 MED ORDER — SODIUM CHLORIDE 0.9 % IV SOLN
INTRAVENOUS | Status: DC | PRN
  Administered 2018-02-17: 1.5 g via INTRAVENOUS

## 2018-02-17 MED ORDER — CARVEDILOL 12.5 MG PO TABS
12.5000 mg | ORAL_TABLET | Freq: Every day | ORAL | Status: DC
  Administered 2018-02-18 – 2018-02-20 (×3): 12.5 mg via ORAL
  Filled 2018-02-17 (×5): qty 1

## 2018-02-17 MED ORDER — ONDANSETRON HCL 4 MG/2ML IJ SOLN
4.0000 mg | Freq: Four times a day (QID) | INTRAMUSCULAR | Status: DC | PRN
  Administered 2018-02-17 – 2018-02-18 (×2): 4 mg via INTRAVENOUS
  Filled 2018-02-17 (×2): qty 2

## 2018-02-17 MED ORDER — FENTANYL CITRATE (PF) 100 MCG/2ML IJ SOLN
INTRAMUSCULAR | Status: AC
  Filled 2018-02-17: qty 2

## 2018-02-17 MED ORDER — LIDOCAINE HCL (CARDIAC) 20 MG/ML IV SOLN
INTRAVENOUS | Status: DC | PRN
  Administered 2018-02-17: 100 mg via INTRATRACHEAL

## 2018-02-17 MED ORDER — EPHEDRINE SULFATE 50 MG/ML IJ SOLN
INTRAMUSCULAR | Status: DC | PRN
  Administered 2018-02-17: 15 mg via INTRAVENOUS

## 2018-02-17 MED ORDER — PHENYLEPHRINE HCL 10 MG/ML IJ SOLN
INTRAVENOUS | Status: DC | PRN
  Administered 2018-02-17: 50 ug/min via INTRAVENOUS

## 2018-02-17 MED ORDER — SODIUM CHLORIDE 0.9 % IV SOLN
INTRAVENOUS | Status: DC | PRN
  Administered 2018-02-17: 08:00:00

## 2018-02-17 MED ORDER — BISACODYL 5 MG PO TBEC: 5.0000 mg | DELAYED_RELEASE_TABLET | Freq: Every day | ORAL | Status: DC | PRN

## 2018-02-17 MED ORDER — INSULIN ASPART 100 UNIT/ML ~~LOC~~ SOLN
0.0000 [IU] | Freq: Three times a day (TID) | SUBCUTANEOUS | Status: DC
  Administered 2018-02-18 – 2018-02-20 (×4): 2 [IU] via SUBCUTANEOUS

## 2018-02-17 MED ORDER — SODIUM CHLORIDE 0.9 % IV SOLN
INTRAVENOUS | Status: AC | PRN
  Administered 2018-02-17: 250 mL via INTRAMUSCULAR

## 2018-02-17 MED ORDER — FENTANYL CITRATE (PF) 250 MCG/5ML IJ SOLN
INTRAMUSCULAR | Status: DC | PRN
  Administered 2018-02-17: 100 ug via INTRAVENOUS
  Administered 2018-02-17: 50 ug via INTRAVENOUS

## 2018-02-17 MED ORDER — SODIUM CHLORIDE 0.9 % IR SOLN
Status: DC | PRN
  Administered 2018-02-17: 1

## 2018-02-17 MED ORDER — HEPARIN SODIUM (PORCINE) 1000 UNIT/ML IJ SOLN
INTRAMUSCULAR | Status: DC | PRN
  Administered 2018-02-17: 11000 [IU] via INTRAVENOUS

## 2018-02-17 MED ORDER — SENNOSIDES-DOCUSATE SODIUM 8.6-50 MG PO TABS: 1.0000 | ORAL_TABLET | Freq: Every evening | ORAL | Status: DC | PRN

## 2018-02-17 MED ORDER — SUCCINYLCHOLINE CHLORIDE 20 MG/ML IJ SOLN
INTRAMUSCULAR | Status: DC | PRN
  Administered 2018-02-17: 100 mg via INTRAVENOUS

## 2018-02-17 MED ORDER — PROPOFOL 1000 MG/100ML IV EMUL
INTRAVENOUS | Status: AC
  Filled 2018-02-17: qty 100

## 2018-02-17 MED ORDER — FENTANYL CITRATE (PF) 100 MCG/2ML IJ SOLN
25.0000 ug | INTRAMUSCULAR | Status: DC | PRN
  Administered 2018-02-17 (×2): 25 ug via INTRAVENOUS
  Administered 2018-02-17: 50 ug via INTRAVENOUS

## 2018-02-17 MED ORDER — CLOPIDOGREL BISULFATE 75 MG PO TABS
75.0000 mg | ORAL_TABLET | Freq: Every day | ORAL | Status: DC
  Administered 2018-02-17 – 2018-02-20 (×4): 75 mg via ORAL
  Filled 2018-02-17 (×5): qty 1

## 2018-02-17 MED ORDER — SODIUM CHLORIDE 0.9 % IV SOLN
1.5000 g | INTRAVENOUS | Status: AC
  Administered 2018-02-17: 1.5 g via INTRAVENOUS

## 2018-02-17 MED ORDER — EPHEDRINE SULFATE 50 MG/ML IJ SOLN
INTRAMUSCULAR | Status: DC | PRN
  Administered 2018-02-17: 10 mg via INTRAVENOUS

## 2018-02-17 MED ORDER — PANTOPRAZOLE SODIUM 40 MG PO TBEC
40.0000 mg | DELAYED_RELEASE_TABLET | Freq: Every day | ORAL | Status: DC
  Administered 2018-02-17 – 2018-02-20 (×3): 40 mg via ORAL
  Filled 2018-02-17 (×5): qty 1

## 2018-02-17 MED ORDER — PROTAMINE SULFATE 10 MG/ML IV SOLN
INTRAVENOUS | Status: DC | PRN
  Administered 2018-02-17: 60 mg via INTRAVENOUS

## 2018-02-17 MED ORDER — CARVEDILOL 12.5 MG PO TABS
12.5000 mg | ORAL_TABLET | Freq: Once | ORAL | Status: DC
  Filled 2018-02-17: qty 1

## 2018-02-17 MED ORDER — PROTAMINE SULFATE 10 MG/ML IV SOLN
INTRAVENOUS | Status: AC
  Filled 2018-02-17: qty 10

## 2018-02-17 MED ORDER — SODIUM CHLORIDE 0.9% FLUSH: 3.0000 mL | INTRAVENOUS | Status: DC | PRN

## 2018-02-17 MED ORDER — LIDOCAINE HCL (CARDIAC) 20 MG/ML IV SOLN
INTRAVENOUS | Status: DC | PRN
  Administered 2018-02-17: 40 mg via INTRATRACHEAL

## 2018-02-17 MED ORDER — SIMVASTATIN 10 MG PO TABS
10.0000 mg | ORAL_TABLET | Freq: Every day | ORAL | Status: DC
  Administered 2018-02-17 – 2018-02-19 (×3): 10 mg via ORAL
  Filled 2018-02-17 (×3): qty 1

## 2018-02-17 MED ORDER — 0.9 % SODIUM CHLORIDE (POUR BTL) OPTIME
TOPICAL | Status: DC | PRN
  Administered 2018-02-17: 1000 mL

## 2018-02-17 MED ORDER — PHENYLEPHRINE HCL 10 MG/ML IJ SOLN
INTRAVENOUS | Status: DC | PRN
  Administered 2018-02-17: 100 ug/min via INTRAVENOUS

## 2018-02-17 MED ORDER — SODIUM CHLORIDE 0.9 % IV SOLN
INTRAVENOUS | Status: DC | PRN
  Administered 2018-02-17: 07:00:00 via INTRAVENOUS

## 2018-02-17 MED ORDER — ONDANSETRON HCL 4 MG/2ML IJ SOLN
INTRAMUSCULAR | Status: DC | PRN
  Administered 2018-02-17: 4 mg via INTRAVENOUS

## 2018-02-17 MED ORDER — FENTANYL CITRATE (PF) 100 MCG/2ML IJ SOLN
INTRAMUSCULAR | Status: DC | PRN
  Administered 2018-02-17: 100 ug via INTRAVENOUS

## 2018-02-17 MED ORDER — CALCITRIOL 0.25 MCG PO CAPS
0.7500 ug | ORAL_CAPSULE | Freq: Every day | ORAL | Status: DC
  Administered 2018-02-17 – 2018-02-20 (×4): 0.75 ug via ORAL
  Filled 2018-02-17 (×4): qty 3

## 2018-02-17 MED ORDER — SODIUM CHLORIDE 0.9% FLUSH
3.0000 mL | Freq: Two times a day (BID) | INTRAVENOUS | Status: DC
  Administered 2018-02-17 – 2018-02-19 (×5): 3 mL via INTRAVENOUS

## 2018-02-17 MED ORDER — METOPROLOL TARTRATE 5 MG/5ML IV SOLN: 2.0000 mg | INTRAVENOUS | Status: DC | PRN

## 2018-02-17 MED ORDER — MORPHINE SULFATE (PF) 2 MG/ML IV SOLN
0.5000 mg | INTRAVENOUS | Status: DC | PRN
  Administered 2018-02-17 – 2018-02-19 (×3): 1 mg via INTRAVENOUS
  Filled 2018-02-17 (×3): qty 1

## 2018-02-17 MED ORDER — PROPOFOL 10 MG/ML IV BOLUS
INTRAVENOUS | Status: AC
  Filled 2018-02-17: qty 20

## 2018-02-17 MED ORDER — SODIUM CHLORIDE 0.9 % IV SOLN: 250.0000 mL | INTRAVENOUS | Status: DC | PRN

## 2018-02-17 MED ORDER — HYDRALAZINE HCL 20 MG/ML IJ SOLN: 5.0000 mg | INTRAMUSCULAR | Status: DC | PRN

## 2018-02-17 MED ORDER — OXYCODONE HCL 5 MG PO TABS
5.0000 mg | ORAL_TABLET | ORAL | Status: DC | PRN
  Administered 2018-02-18 – 2018-02-20 (×4): 10 mg via ORAL
  Filled 2018-02-17 (×4): qty 2

## 2018-02-17 MED ORDER — INSULIN GLARGINE 100 UNIT/ML ~~LOC~~ SOLN
28.0000 [IU] | Freq: Every day | SUBCUTANEOUS | Status: DC
  Administered 2018-02-19 – 2018-02-20 (×2): 28 [IU] via SUBCUTANEOUS
  Filled 2018-02-17 (×3): qty 0.28

## 2018-02-17 MED ORDER — DOCUSATE SODIUM 100 MG PO CAPS
100.0000 mg | ORAL_CAPSULE | Freq: Two times a day (BID) | ORAL | Status: DC
  Administered 2018-02-17 – 2018-02-19 (×3): 100 mg via ORAL
  Filled 2018-02-17 (×6): qty 1

## 2018-02-17 MED ORDER — ARGATROBAN 1 MG/ML SYRINGE FOR VASCULAR SURGERY
10.0000 mg | INTRAVENOUS | Status: AC
  Administered 2018-02-17: 10 mg via INTRAVENOUS
  Filled 2018-02-17 (×2): qty 10

## 2018-02-17 MED ORDER — PROMETHAZINE HCL 25 MG/ML IJ SOLN: 6.2500 mg | INTRAMUSCULAR | Status: DC | PRN

## 2018-02-17 MED ORDER — POTASSIUM CHLORIDE CRYS ER 20 MEQ PO TBCR: 20.0000 meq | EXTENDED_RELEASE_TABLET | Freq: Once | ORAL | Status: DC

## 2018-02-17 MED ORDER — ONDANSETRON HCL 4 MG/2ML IJ SOLN
INTRAMUSCULAR | Status: AC
  Filled 2018-02-17: qty 2

## 2018-02-17 MED ORDER — ONDANSETRON HCL 4 MG/2ML IJ SOLN
INTRAMUSCULAR | Status: AC
Start: 2018-02-17 — End: 2018-02-17
  Filled 2018-02-17: qty 2

## 2018-02-17 MED ORDER — ALBUTEROL SULFATE HFA 108 (90 BASE) MCG/ACT IN AERS
INHALATION_SPRAY | RESPIRATORY_TRACT | Status: AC
  Filled 2018-02-17: qty 6.7

## 2018-02-17 MED ORDER — FUROSEMIDE 80 MG PO TABS
160.0000 mg | ORAL_TABLET | Freq: Every day | ORAL | Status: DC
  Administered 2018-02-17 – 2018-02-20 (×3): 160 mg via ORAL
  Filled 2018-02-17 (×5): qty 2

## 2018-02-17 SURGICAL SUPPLY — 37 items
ARMBAND PINK RESTRICT EXTREMIT (MISCELLANEOUS) ×2 IMPLANT
CANISTER SUCT 3000ML PPV (MISCELLANEOUS) ×2 IMPLANT
CLIP VESOCCLUDE MED 6/CT (CLIP) ×2 IMPLANT
CLIP VESOCCLUDE SM WIDE 6/CT (CLIP) ×2 IMPLANT
COVER PROBE W GEL 5X96 (DRAPES) ×2 IMPLANT
DECANTER SPIKE VIAL GLASS SM (MISCELLANEOUS) IMPLANT
DERMABOND ADVANCED (GAUZE/BANDAGES/DRESSINGS) ×1
DERMABOND ADVANCED .7 DNX12 (GAUZE/BANDAGES/DRESSINGS) ×1 IMPLANT
ELECT REM PT RETURN 9FT ADLT (ELECTROSURGICAL) ×2
ELECTRODE REM PT RTRN 9FT ADLT (ELECTROSURGICAL) ×1 IMPLANT
GLOVE BIO SURGEON STRL SZ7 (GLOVE) ×2 IMPLANT
GLOVE BIOGEL PI IND STRL 6.5 (GLOVE) ×3 IMPLANT
GLOVE BIOGEL PI IND STRL 7.0 (GLOVE) ×1 IMPLANT
GLOVE BIOGEL PI IND STRL 7.5 (GLOVE) ×2 IMPLANT
GLOVE BIOGEL PI INDICATOR 6.5 (GLOVE) ×3
GLOVE BIOGEL PI INDICATOR 7.0 (GLOVE) ×1
GLOVE BIOGEL PI INDICATOR 7.5 (GLOVE) ×2
GLOVE ECLIPSE 6.5 STRL STRAW (GLOVE) ×2 IMPLANT
GLOVE SURG SS PI 6.0 STRL IVOR (GLOVE) ×2 IMPLANT
GOWN STRL REUS W/ TWL LRG LVL3 (GOWN DISPOSABLE) ×4 IMPLANT
GOWN STRL REUS W/TWL LRG LVL3 (GOWN DISPOSABLE) ×4
HEMOSTAT SPONGE AVITENE ULTRA (HEMOSTASIS) ×2 IMPLANT
KIT BASIN OR (CUSTOM PROCEDURE TRAY) ×2 IMPLANT
KIT ROOM TURNOVER OR (KITS) ×2 IMPLANT
LOOP VESSEL MINI RED (MISCELLANEOUS) ×2 IMPLANT
NS IRRIG 1000ML POUR BTL (IV SOLUTION) ×2 IMPLANT
PACK CV ACCESS (CUSTOM PROCEDURE TRAY) ×2 IMPLANT
PAD ARMBOARD 7.5X6 YLW CONV (MISCELLANEOUS) ×4 IMPLANT
SUT MNCRL AB 4-0 PS2 18 (SUTURE) ×2 IMPLANT
SUT PROLENE 6 0 BV (SUTURE) ×4 IMPLANT
SUT SILK 2 0 SH (SUTURE) ×2 IMPLANT
SUT VIC AB 3-0 SH 27 (SUTURE) ×1
SUT VIC AB 3-0 SH 27X BRD (SUTURE) ×1 IMPLANT
SYSTEM CHEST DRAIN TLS 7FR (DRAIN) ×2 IMPLANT
TOWEL GREEN STERILE (TOWEL DISPOSABLE) ×2 IMPLANT
UNDERPAD 30X30 (UNDERPADS AND DIAPERS) ×2 IMPLANT
WATER STERILE IRR 1000ML POUR (IV SOLUTION) ×2 IMPLANT

## 2018-02-17 SURGICAL SUPPLY — 31 items
ARMBAND PINK RESTRICT EXTREMIT (MISCELLANEOUS) ×3 IMPLANT
BNDG ESMARK 4X9 LF (GAUZE/BANDAGES/DRESSINGS) IMPLANT
CANISTER SUCT 3000ML PPV (MISCELLANEOUS) ×3 IMPLANT
CATH EMB 3FR 40CM (CATHETERS) ×3 IMPLANT
CATH EMB 4FR 80CM (CATHETERS) IMPLANT
CLIP VESOCCLUDE MED 6/CT (CLIP) ×3 IMPLANT
CLIP VESOCCLUDE SM WIDE 6/CT (CLIP) ×3 IMPLANT
COVER PROBE W GEL 5X96 (DRAPES) ×3 IMPLANT
DERMABOND ADVANCED (GAUZE/BANDAGES/DRESSINGS) ×1
DERMABOND ADVANCED .7 DNX12 (GAUZE/BANDAGES/DRESSINGS) ×2 IMPLANT
ELECT REM PT RETURN 9FT ADLT (ELECTROSURGICAL) ×3
ELECTRODE REM PT RTRN 9FT ADLT (ELECTROSURGICAL) ×2 IMPLANT
GLOVE BIO SURGEON STRL SZ7 (GLOVE) ×3 IMPLANT
GLOVE BIOGEL PI IND STRL 7.5 (GLOVE) ×2 IMPLANT
GLOVE BIOGEL PI INDICATOR 7.5 (GLOVE) ×1
GOWN STRL REUS W/ TWL LRG LVL3 (GOWN DISPOSABLE) ×6 IMPLANT
GOWN STRL REUS W/TWL LRG LVL3 (GOWN DISPOSABLE) ×3
HEMOSTAT SPONGE AVITENE ULTRA (HEMOSTASIS) IMPLANT
KIT BASIN OR (CUSTOM PROCEDURE TRAY) ×3 IMPLANT
KIT ROOM TURNOVER OR (KITS) ×3 IMPLANT
NS IRRIG 1000ML POUR BTL (IV SOLUTION) ×3 IMPLANT
PACK CV ACCESS (CUSTOM PROCEDURE TRAY) ×3 IMPLANT
PAD ARMBOARD 7.5X6 YLW CONV (MISCELLANEOUS) ×6 IMPLANT
SUT MNCRL AB 4-0 PS2 18 (SUTURE) ×6 IMPLANT
SUT PROLENE 6 0 BV (SUTURE) IMPLANT
SUT PROLENE 7 0 BV 1 (SUTURE) ×6 IMPLANT
SUT VIC AB 3-0 SH 27 (SUTURE) ×2
SUT VIC AB 3-0 SH 27X BRD (SUTURE) ×4 IMPLANT
TOWEL GREEN STERILE (TOWEL DISPOSABLE) ×3 IMPLANT
UNDERPAD 30X30 (UNDERPADS AND DIAPERS) ×3 IMPLANT
WATER STERILE IRR 1000ML POUR (IV SOLUTION) ×3 IMPLANT

## 2018-02-17 SURGICAL SUPPLY — 39 items
ARMBAND PINK RESTRICT EXTREMIT (MISCELLANEOUS) ×4 IMPLANT
CANISTER SUCT 3000ML PPV (MISCELLANEOUS) ×2 IMPLANT
CLIP VESOCCLUDE MED 6/CT (CLIP) ×2 IMPLANT
CLIP VESOCCLUDE SM WIDE 6/CT (CLIP) ×2 IMPLANT
COVER PROBE W GEL 5X96 (DRAPES) ×2 IMPLANT
DECANTER SPIKE VIAL GLASS SM (MISCELLANEOUS) ×2 IMPLANT
DERMABOND ADVANCED (GAUZE/BANDAGES/DRESSINGS) ×1
DERMABOND ADVANCED .7 DNX12 (GAUZE/BANDAGES/DRESSINGS) ×1 IMPLANT
ELECT REM PT RETURN 9FT ADLT (ELECTROSURGICAL) ×2
ELECTRODE REM PT RTRN 9FT ADLT (ELECTROSURGICAL) ×1 IMPLANT
GLOVE BIO SURGEON STRL SZ7 (GLOVE) ×2 IMPLANT
GLOVE BIOGEL PI IND STRL 6.5 (GLOVE) ×5 IMPLANT
GLOVE BIOGEL PI IND STRL 7.0 (GLOVE) ×1 IMPLANT
GLOVE BIOGEL PI IND STRL 7.5 (GLOVE) ×2 IMPLANT
GLOVE BIOGEL PI INDICATOR 6.5 (GLOVE) ×5
GLOVE BIOGEL PI INDICATOR 7.0 (GLOVE) ×1
GLOVE BIOGEL PI INDICATOR 7.5 (GLOVE) ×2
GLOVE ECLIPSE 6.5 STRL STRAW (GLOVE) ×4 IMPLANT
GLOVE ECLIPSE 7.0 STRL STRAW (GLOVE) ×2 IMPLANT
GLOVE SURG SS PI 6.0 STRL IVOR (GLOVE) ×2 IMPLANT
GOWN STRL REUS W/ TWL LRG LVL3 (GOWN DISPOSABLE) ×6 IMPLANT
GOWN STRL REUS W/TWL LRG LVL3 (GOWN DISPOSABLE) ×6
GRAFT GORETEX STRT 4-7X45 (Vascular Products) ×2 IMPLANT
HEMOSTAT SPONGE AVITENE ULTRA (HEMOSTASIS) ×2 IMPLANT
KIT BASIN OR (CUSTOM PROCEDURE TRAY) ×2 IMPLANT
KIT ROOM TURNOVER OR (KITS) ×2 IMPLANT
NS IRRIG 1000ML POUR BTL (IV SOLUTION) ×2 IMPLANT
PACK CV ACCESS (CUSTOM PROCEDURE TRAY) ×2 IMPLANT
PAD ARMBOARD 7.5X6 YLW CONV (MISCELLANEOUS) ×4 IMPLANT
SUT GORETEX 6.0 TT13 (SUTURE) IMPLANT
SUT MNCRL AB 4-0 PS2 18 (SUTURE) ×4 IMPLANT
SUT PROLENE 6 0 BV (SUTURE) ×4 IMPLANT
SUT PROLENE 7 0 BV 1 (SUTURE) ×4 IMPLANT
SUT SILK 2 0 PERMA HAND 18 BK (SUTURE) IMPLANT
SUT VIC AB 3-0 SH 27 (SUTURE) ×2
SUT VIC AB 3-0 SH 27X BRD (SUTURE) ×2 IMPLANT
TOWEL GREEN STERILE (TOWEL DISPOSABLE) ×2 IMPLANT
UNDERPAD 30X30 (UNDERPADS AND DIAPERS) ×2 IMPLANT
WATER STERILE IRR 1000ML POUR (IV SOLUTION) ×2 IMPLANT

## 2018-02-17 NOTE — Anesthesia Procedure Notes (Signed)
Procedure Name: LMA Insertion Date/Time: 02/17/2018 8:05 AM Performed by: Duane Boston, MD Pre-anesthesia Checklist: Patient identified, Emergency Drugs available, Suction available and Patient being monitored Patient Re-evaluated:Patient Re-evaluated prior to induction Oxygen Delivery Method: Circle system utilized Preoxygenation: Pre-oxygenation with 100% oxygen Induction Type: IV induction Ventilation: Mask ventilation without difficulty LMA: LMA inserted LMA Size: 5.0 Number of attempts: 1 Placement Confirmation: positive ETCO2 and breath sounds checked- equal and bilateral Tube secured with: Tape Dental Injury: Teeth and Oropharynx as per pre-operative assessment

## 2018-02-17 NOTE — Transfer of Care (Signed)
Immediate Anesthesia Transfer of Care Note  Patient: Heather [name]  Procedure(s) Performed: INSERTION OF ARTERIOVENOUS (AV) GORE-TEX GRAFT ARM RIGHT UPPER ARM (Right Arm Upper)  Patient Location: PACU  Anesthesia Type:General  Level of Consciousness: awake, alert  and patient cooperative  Airway & Oxygen Therapy: Patient Spontanous Breathing, Patient connected to nasal cannula oxygen and Patient connected to face mask oxygen  Post-op Assessment: Report given to RN, Post -op Vital signs reviewed and stable, Patient moving all extremities X 4 and Patient able to stick tongue midline  Post vital signs: Reviewed and stable  Last Vitals:  Vitals:   02/17/18 0549  BP: (!) 154/85  Pulse: 80  Resp: 18  Temp: 36.7 C  SpO2: 100%    Last Pain:  Vitals:   02/17/18 0549  TempSrc: Oral      Patients Stated Pain Goal: 2 (46/80/32 1224)  Complications: No apparent anesthesia complications

## 2018-02-17 NOTE — Anesthesia Postprocedure Evaluation (Signed)
Anesthesia Post Note  Patient: Heather [name]  Procedure(s) Performed: BRACHIAL ARTERY EXPLORATION AND TRHOMBECTOMY (Right )     Patient location during evaluation: PACU Anesthesia Type: General Level of consciousness: sedated Pain management: pain level controlled Vital Signs Assessment: post-procedure vital signs reviewed and stable Respiratory status: spontaneous breathing and respiratory function stable Cardiovascular status: stable Postop Assessment: no apparent nausea or vomiting Anesthetic complications: no    Last Vitals:  Vitals:   02/17/18 1630 02/17/18 1636  BP:  140/85  Pulse: 87 88  Resp: 15 15  Temp:    SpO2: 99% 99%    Last Pain:  Vitals:   02/17/18 1437  TempSrc:   PainSc: Asleep                 Langdon Crosson DANIEL

## 2018-02-17 NOTE — Progress Notes (Signed)
SPOKE WITH SON INFORMED HIM PT RETUTNING  TO SURGERY AND REASON CONSENT GIVEN OVER PHONE TO  ALSO SPOKE WITH DR [name] STATES NO OP PERMIT NEEDED HE IS DOING IT UNDER EMERGENT

## 2018-02-17 NOTE — Transfer of Care (Signed)
Immediate Anesthesia Transfer of Care Note  Patient: Heather [name]  Procedure(s) Performed: EXPLORATION OF RIGHT BRACHIAL ARTERY (Right ) REMOVAL OF RIGHT UPPER ARM ARTERIOVENOUS GRAFT (Right Arm Upper)  Patient Location: PACU  Anesthesia Type:General  Level of Consciousness: awake, alert  and oriented  Airway & Oxygen Therapy: Patient connected to face mask oxygen  Post-op Assessment: Report given to RN, Post -op Vital signs reviewed and stable and Patient moving all extremities X 4  Post vital signs: Reviewed and stable  Last Vitals:  Vitals:   02/17/18 1215 02/17/18 1245  BP: (!) 143/89 117/75  Pulse: 83 83  Resp: 17 14  Temp:  36.6 C  SpO2: 100% 94%    Last Pain:  Vitals:   02/17/18 1245  TempSrc:   PainSc: 7       Patients Stated Pain Goal: 2 (95/32/02 3343)  Complications: No apparent anesthesia complications

## 2018-02-17 NOTE — Anesthesia Preprocedure Evaluation (Signed)
Anesthesia Evaluation  Patient identified by MRN, date of birth, ID band Patient awake    Reviewed: Allergy & Precautions, NPO status , Patient's Chart, lab work & pertinent test results  History of Anesthesia Complications Negative for: history of anesthetic complications  Airway Mallampati: II  TM Distance: >3 FB Neck ROM: Full    Dental  (+) Teeth Intact, Dental Advisory Given Braces:   Pulmonary neg pulmonary ROS,    breath sounds clear to auscultation       Cardiovascular hypertension,  Rhythm:Regular Rate:Normal  TEE 12/05/17: Study Conclusions - Left ventricle: The cavity size was normal. There was mild concentric hypertrophy. Systolic function was normal. The estimated ejection fraction was in the range of 60% to 65%. Wall motion was normal; there were no regional wall motion abnormalities.     Neuro/Psych PSYCHIATRIC DISORDERS Depression CVA    GI/Hepatic GERD  ,  Endo/Other  diabetesHyperthyroidism Morbid obesity  Renal/GU CRFRenal disease     Musculoskeletal  (+) Fibromyalgia -  Abdominal   Peds  Hematology   Anesthesia Other Findings   Reproductive/Obstetrics                             Anesthesia Physical  Anesthesia Plan  ASA: III  Anesthesia Plan: General   Post-op Pain Management:    Induction: Intravenous  PONV Risk Score and Plan: 3 and Ondansetron, Dexamethasone and Diphenhydramine  Airway Management Planned: Oral ETT  Additional Equipment:   Intra-op Plan:   Post-operative Plan: Extubation in OR  Informed Consent: I have reviewed the patients History and Physical, chart, labs and discussed the procedure including the risks, benefits and alternatives for the proposed anesthesia with the patient or authorized representative who has indicated his/her understanding and acceptance.   Dental advisory given  Plan Discussed with: CRNA,  Anesthesiologist and Surgeon  Anesthesia Plan Comments:         Anesthesia Quick Evaluation

## 2018-02-17 NOTE — Telephone Encounter (Signed)
Sched appt 03/15/18 at 2:00. Lm on cell# to inform pt of appt.

## 2018-02-17 NOTE — Telephone Encounter (Signed)
Cxl'd 03/15/18 appt. Sched new appt 03/01/18 at 1:30. Lm on pt's cell# to inform pt of change in appt.

## 2018-02-17 NOTE — Telephone Encounter (Signed)
-----   Message from Mena Goes, RN sent at 02/17/2018  2:17 PM EST -----   ----- Message ----- From: Conrad [place], MD Sent: 02/17/2018  12:01 PM To: [address]  MARSHIA TROPEA [id number] 07-15-1971  PROCEDURE: 1. Vein patch angioplasty right brachial artery 2. Removal of right upper arm arteriovenous graft   Follow-up: 2 weeks with MD

## 2018-02-17 NOTE — Interval H&P Note (Signed)
History and Physical Interval Note:  02/17/2018 7:13 AM  Heather [name]  has presented today for surgery, with the diagnosis of CHRONIC KIDNEY DISEASE FOR HEMODILAYSIS ACCESS  The various methods of treatment have been discussed with the patient and family. After consideration of risks, benefits and other options for treatment, the patient has consented to  Procedure(s): INSERTION OF ARTERIOVENOUS (AV) GORE-TEX GRAFT ARM RIGHT UPPER ARM (Right) as a surgical intervention .  The patient's history has been reviewed, patient examined, no change in status, stable for surgery.  I have reviewed the patient's chart and labs.  Questions were answered to the patient's satisfaction.     Adele Barthel

## 2018-02-17 NOTE — Op Note (Signed)
OPERATIVE NOTE   PROCEDURE: 1. Thromboembolectomy right brachial artery, radial artery and ulnar artery  PRE-OPERATIVE DIAGNOSIS: persistent right hand ischemia, possible brachial artery occlusion  POST-OPERATIVE DIAGNOSIS: same as above   SURGEON: Adele Barthel, MD  ASSISTANT(S): Gerri Lins, PAC   ANESTHESIA: general  ESTIMATED BLOOD LOSS: 50 cc  FINDING(S): 1.  Thrombosed short segment of brachial artery distal to prior arterial anastomosis: non-acute appearing thrombus retrieved 2.  Minimal non-acute thrombus in ulnar artery: return of pulsatile backbleeding 3.  Persistent slow backbleeding from radial artery: no thrombus with thrombectomy, somewhat improved backbleeding 4.  Ulnar > radial artery backbleeding 5.  Multiphasic waveforms in brachial, radial and ulnar artery at end of case 6.  Dopplerable palmar arch signal  SPECIMEN(S):  Brachial artery thrombus  INDICATIONS:   ATHALIE NEWHARD is a 47 y.o. female who presents with persistent right hand ischemia and loss of radial and brachial artery after recent take down of right upper arm arteriovenous graft for severe steal syndrome from recently placed arteriovenous graft.  I recommended: right brachial artery exploration possible radial and ulnar artery thrombectomy.  No consent was obtained as this was an emergent procedure.  Patient agreed to proceed.  DESCRIPTION: After obtaining full informed written consent, the patient was brought back to the operating room and placed supine upon the operating table.  The patient received IV antibiotics prior to induction.  A procedure time out was completed and the correct surgical site was verified.  After obtaining adequate anesthesia, the patient was prepped and draped in the standard fashion for: right arm access procedure.  Under Sonosite, I identified the brachial artery bifurcation into radial and ulnar arteries.  I took off the Dermabond on the antecubital incision.  I  extended the incision down onto the forearm.  I dissected out the brachial artery until I identified the bifurcation into radial and ulnar arteries.  I could feel the location of the brachial artery occlusion by the loss of pulse.  This appeared to be distal to the prior vein patched segment.  There was no obvious etiology for this findings as I had previously passed a Fogarty distally without any thrombus retrieved.    I gave 10 mg of Argatroban as a bolus as I had concerns this patient had heparin induced thrombocytopenia and thrombosis.  After waiting 3 minutes, I clamped the proximal brachial artery which had a pulse and then clamped the radial artery first.  I made an transverse arteriotomy in the distal brachial artery proximal to the bifurcation.  I extended this with a Potts scissor.  The artery was 4-4.5 mm in diameter here.  There was minimal backbleeding from the ulnar artery.  I passed a 3 Fogarty distally down the ulnar artery and immediately got minimal non-acute thrombus from the artery.  Immediately pulsatile backbleeding developed.  I clamped the ulnar artery.  I then repeated the thrombectomy with the radial artery.  There was persistent slow backbleeding from the radial artery even before the thrombectomy.  The thrombectomy never obtained any thrombus.  The backbleeding from the ulnar artery was more vigorous than from the radial artery.  I clamped the radial artery.  I then released the clamp on the brachial artery.  There was persistent flow in the brachial artery despite the thrombus.  I passed the Fogarty proximally and extracted the entire thrombus.  This thrombus looked non-acute.  After this thrombus was extracted, there was return of pulsatile bleeding.  I clamped the brachial  artery.  I repaired the arteriotomy with two 6-0 Prolene stitches.  Prior to completing this repair, I backbleed all arteries: no clot was noted and pulsatile bleeding was present.  I completed this repair in the  usual fashion.  There was palpable radial pulse distally.  Brachial artery signal was: multiphasic, radial and ulnar artery signal was multiphasic.  Palmar arch signal was dopplerable.  I packed the incision with Avitene.  After a few minutes, no further bleeding was present.  Due to the use of non-reversible Argatroban, I placed a TLS drain through the subcutaneous tissue and through the skin the mid-forearm.  I secured the drain to the skin with a 3-0 silk suture tied to the drain.  I laid the drain adjacent to the brachial artery.  The subcutaneous tissue was reapproximated with a double layer of running stitch of 3-0 Vicryl.  The skin was then reapproximated with a running subcuticular of 4-0 Monocryl.  The skin was cleaned, dried, and reinforced with Dermabond.  At this end of this case, the signals in the right arm remained multiphasic.   COMPLICATIONS: none  CONDITION: stable   Adele Barthel, MD, [facility] [employer] Office: [phone] Pager: [phone]  02/17/2018, 2:11 PM

## 2018-02-17 NOTE — Op Note (Addendum)
OPERATIVE NOTE   PROCEDURE:  right upper arm arteriovenous graft  PRE-OPERATIVE DIAGNOSIS: end stage renal disease   POST-OPERATIVE DIAGNOSIS: same as above   SURGEON: Adele Barthel, MD  ASSISTANT(S): Gerri Lins, PAC   ANESTHESIA: general  ESTIMATED BLOOD LOSS: 30 cc  FINDING(S): 1. Monophasic radial signal baseline: no significant changes with venous outflow compression 2. Right brachial artery: 3 mm with some anterior wall disease 3. Faintly palpable thrill at end of case with doppler signal consistent with widely patent arteriovenous graft  4. High brachial vein: 5-6 mm, compressible.  Adjacent high brachial vein thrombosed  SPECIMEN(S):  none  INDICATIONS:   Heather [name] is a 47 y.o. female who presents with end stage renal disease requiring hemodialysis.  The patient present for right upper arm arteriovenous graft.  Risk, benefits, and alternatives to access surgery were discussed.  The patient is aware the risks include but are not limited to: bleeding, infection, steal syndrome, nerve damage, ischemic monomelic neuropathy, failure to mature, and need for additional procedures.  The patient is aware of the risks and elects to proceed forward.   DESCRIPTION: After full informed written consent was obtained from the patient, the patient was brought back to the operating room and placed supine upon the operating table.  The patient was given IV antibiotics prior to proceeding.  After obtaining adequate sedation, the patient was prepped and draped in standard fashion for a right arm access procedure.    I turned my attention first to the antecubitum.  Under ultrasound guidance, I identified the location of the brachial artery and marked it on the skin.  I then examined the bicipital groove and identified the high brachial vein and marked it on the skin.    I made an incision over the brachial artery, and dissected down through the subcutaneous tissue to the fascia  carefully and was able to dissect out the brachial artery.  The artery was about 3 mm externally.  It was controlled proximally and distally with vessel loops   I turned my attention to the high bicipital groove.  I made an incision overlying the proximal vein and dissected down through the subcutaneous tissue and fascia until I reached the high brachial vein.  Externally, it appeared to be 5-6 mm in diameter.  I then dissected out this vein proximally and distally.    I took a Dietitian and dissected from the arterial exposure up to the venous exposure.  I then delivered the 4 x 7-mm stretch Gore-Tex graft through the metal tunnel and then pulled out the metal tunnel leaving the graft in place.  The 4-mm end of the graft was left in the arterial exposure and the 7 mm graft was left in the venous exposure.    I placed the brachial artery under tension proximally and distally with vessel loops.  I then made an arteriotomy and extended it with a Potts scissor.  I sewed the 4-mm end of the graft to this arteriotomy with a running stitch of 7-0 Prolene.  I released the vessel loops on the inflow and allowed the artery to decompress through the graft. There was good pulsatile bleeding through this graft.  I clamped the graft near its arterial anastomosis and sucked out all the blood in the graft and loaded the graft with heparinized saline.    At this point, I pulled the graft to appropriate length and reset my exposure of the high brachial vein.  I tied  off the vein distally with a 2-0 silk and then transected it.  There was good venous backbleeding from the vein.  I then injected some heparinized saline into this vein and then clamped it.  I then spatulated the vein to facilitate an end-to-end anastomosis.  I also spatulated the graft to facilitate an end-to-end anastomosis.  In the process of spatulating, I cut the graft to appropriate length for this anastomosis.  This graft was sewn to the vein in  an end-to-end configuration with a 6-0 Prolene.  Prior to completing this anastomosis, I allowed the vein to backbleed.  I also allowed the graft to bleed in an antegrade fashion.  There was no clot observe from either end of the anastomosis.  I completed this anastomosis in the usual fashion.  At this point, I irrigated out the venous exposure and then packed Avitene in this incision.  I turned my attention back to the arterial exposure.  This incision was washed out also.  I repacked the incision with Avitene.  I tested the distal arterial flow with a continuous doppler: distal radial signal was dopplerable and monophasic.  Even with venous outflow clamped the radial artery flow was monophasic.  The brachial artery proximally had multiphasic waveforms, distally there was a monophasic signal with palpable pulse in the downstream brachial artery.  The venous outflow had a flow signature consistent with a widely patent arteriovenous graft.    At this point, I washed out the arterial exposure.  There was no more active bleeding.  The subcutaneous tissue was reapproximated with a running stitch of 3-0 Vicryl.  The skin was then reapproximated with a running subcuticular 4-0 Monocryl.  The skin was then cleaned, dried, and Dermabond used to reinforce the skin closure.  I then repeated this same process in the vein exposure.  Similarly the bleeding was evaluated and the subcutaneous tissue and skin reapproximated and closed.   COMPLICATIONS: none  CONDITION: stable   Adele Barthel, MD, [facility] [place] Office: [phone] Pager: [phone]  02/17/2018, 9:46 AM

## 2018-02-17 NOTE — Anesthesia Preprocedure Evaluation (Addendum)
Anesthesia Evaluation  Patient identified by MRN, date of birth, ID band Patient awake    Reviewed: Allergy & Precautions, NPO status , Patient's Chart, lab work & pertinent test results  History of Anesthesia Complications Negative for: history of anesthetic complications  Airway Mallampati: II  TM Distance: >3 FB Neck ROM: Full    Dental  (+) Teeth Intact, Dental Advisory Given Braces:   Pulmonary neg pulmonary ROS,    breath sounds clear to auscultation       Cardiovascular hypertension,  Rhythm:Regular Rate:Normal  TEE 12/05/17: Study Conclusions - Left ventricle: The cavity size was normal. There was mild concentric hypertrophy. Systolic function was normal. The estimated ejection fraction was in the range of 60% to 65%. Wall motion was normal; there were no regional wall motion abnormalities.     Neuro/Psych PSYCHIATRIC DISORDERS Depression CVA    GI/Hepatic GERD  ,  Endo/Other  diabetesHyperthyroidism Morbid obesity  Renal/GU CRFRenal disease     Musculoskeletal  (+) Fibromyalgia -  Abdominal   Peds  Hematology   Anesthesia Other Findings   Reproductive/Obstetrics                            Anesthesia Physical  Anesthesia Plan  ASA: III  Anesthesia Plan: General   Post-op Pain Management:    Induction: Intravenous  PONV Risk Score and Plan: 3 and Ondansetron, Dexamethasone and Diphenhydramine  Airway Management Planned: LMA  Additional Equipment:   Intra-op Plan:   Post-operative Plan: Extubation in OR  Informed Consent: I have reviewed the patients History and Physical, chart, labs and discussed the procedure including the risks, benefits and alternatives for the proposed anesthesia with the patient or authorized representative who has indicated his/her understanding and acceptance.   Dental advisory given  Plan Discussed with: CRNA and  Anesthesiologist  Anesthesia Plan Comments:        Anesthesia Quick Evaluation

## 2018-02-17 NOTE — Transfer of Care (Signed)
Immediate Anesthesia Transfer of Care Note  Patient: Heather [name]  Procedure(s) Performed: BRACHIAL ARTERY EXPLORATION AND TRHOMBECTOMY (Right )  Patient Location: PACU  Anesthesia Type:General  Level of Consciousness: awake, alert , oriented and patient cooperative  Airway & Oxygen Therapy: Patient Spontanous Breathing, Patient connected to nasal cannula oxygen and Patient connected to face mask oxygen  Post-op Assessment: Report given to RN, Post -op Vital signs reviewed and stable, Patient moving all extremities X 4 and Patient able to stick tongue midline  Post vital signs: Reviewed and stable  Last Vitals:  Vitals:   02/17/18 1215 02/17/18 1245  BP: (!) 143/89 117/75  Pulse: 83 83  Resp: 17 14  Temp:  36.6 C  SpO2: 100% 94%    Last Pain:  Vitals:   02/17/18 1245  TempSrc:   PainSc: 7       Patients Stated Pain Goal: 2 (63/84/66 5993)  Complications: No apparent anesthesia complications

## 2018-02-17 NOTE — Discharge Instructions (Signed)
° °  [facility] [place] ° °Discharge Instructions ° °AV Fistula or Graft Surgery for Dialysis Access ° °Please refer to the following instructions for your post-procedure care. Your surgeon or physician assistant will discuss any changes with you. ° °Activity ° °You may drive the day following your surgery, if you are comfortable and no longer taking prescription pain medication. Resume full activity as the soreness in your incision resolves. ° °Bathing/Showering ° °You may shower after you go home. Keep your incision dry for 48 hours. Do not soak in a bathtub, hot tub, or swim until the incision heals completely. You may not shower if you have a hemodialysis catheter. ° °Incision Care ° °Clean your incision with mild soap and water after 48 hours. Pat the area dry with a clean towel. You do not need a bandage unless otherwise instructed. Do not apply any ointments or creams to your incision. You may have skin glue on your incision. Do not peel it off. It will come off on its own in about one week. Your arm may swell a bit after surgery. To reduce swelling use pillows to elevate your arm so it is above your heart. Your doctor will tell you if you need to lightly wrap your arm with an ACE bandage. ° °Diet ° °Resume your normal diet. There are not special food restrictions following this procedure. In order to heal from your surgery, it is CRITICAL to get adequate nutrition. Your body requires vitamins, minerals, and protein. Vegetables are the best source of vitamins and minerals. Vegetables also provide the perfect balance of protein. Processed food has little nutritional value, so try to avoid this. ° °Medications ° °Resume taking all of your medications. If your incision is causing pain, you may take over-the counter pain relievers such as acetaminophen (Tylenol). If you were prescribed a stronger pain medication, please be aware these medications can cause nausea and constipation. Prevent  nausea by taking the medication with a snack or meal. Avoid constipation by drinking plenty of fluids and eating foods with high amount of fiber, such as fruits, vegetables, and grains. Do not take Tylenol if you are taking prescription pain medications. ° ° ° ° °Follow up °Your surgeon may want to see you in the office following your access surgery. If so, this will be arranged at the time of your surgery. ° °Please call us immediately for any of the following conditions: ° °Increased pain, redness, drainage (pus) from your incision site °Fever of 101 degrees or higher °Severe or worsening pain at your incision site °Hand pain or numbness. ° °Reduce your risk of vascular disease: ° °Stop smoking. If you would like help, call [facility] at 1-800-QUIT-NOW (1-800-784-8669) or [place] at 336-586-4000 ° °Manage your cholesterol °Maintain a desired weight °Control your diabetes °Keep your blood pressure down ° °Dialysis ° °It will take several weeks to several months for your new dialysis access to be ready for use. Your surgeon will determine when it is OK to use it. Your nephrologist will continue to direct your dialysis. You can continue to use your Permcath until your new access is ready for use. ° °If you have any questions, please call the office at [phone]. ° °

## 2018-02-17 NOTE — Anesthesia Procedure Notes (Signed)
Procedure Name: LMA Insertion Date/Time: 02/17/2018 10:56 AM Performed by: Duane Boston, MD Pre-anesthesia Checklist: Patient identified, Emergency Drugs available, Suction available and Patient being monitored Patient Re-evaluated:Patient Re-evaluated prior to induction Oxygen Delivery Method: Circle system utilized Preoxygenation: Pre-oxygenation with 100% oxygen Induction Type: IV induction Ventilation: Mask ventilation without difficulty LMA: LMA inserted LMA Size: 5.0 Number of attempts: 1 Placement Confirmation: positive ETCO2 and breath sounds checked- equal and bilateral Tube secured with: Tape

## 2018-02-17 NOTE — Telephone Encounter (Signed)
-----   Message from Mena Goes, RN sent at 02/17/2018 11:19 AM EST ----- Regarding: 4 weeks PA clinic   ----- Message ----- From: Ulyses Amor, PA-C Sent: 02/17/2018  10:05 AM To: Vvs Charge Pool  F/U in the Wednesday PA clinic in 4 weeks no study s/p av graft

## 2018-02-17 NOTE — Progress Notes (Addendum)
   Daily Progress Note   Shortly after arrival in PACU, pt's radial pulse disappeared.  Radial artery signal was severely attenuated.  Brachial artery signal was also monophasic in what was a palpable artery previously.  Pt's motor exam remains poor.  - Unclear etiology of this patient's ?thrombosis. - Would consider HITT in the DDx - There was no thrombus on passing Fogarty distally, but exploration of radial and ulnar arteries indicated at this time - I recommended: Re-exploration right brachial artery, possible radial and ulnar artery thromboectomy  Adele Barthel, MD, FACS [facility] [place] Office: [phone] Pager: [phone]  02/17/2018, 12:17 PM

## 2018-02-17 NOTE — Anesthesia Procedure Notes (Signed)
Procedure Name: Intubation Date/Time: 02/17/2018 1:02 PM Performed by: Duane Boston, MD Pre-anesthesia Checklist: Patient identified, Emergency Drugs available, Suction available and Patient being monitored Patient Re-evaluated:Patient Re-evaluated prior to induction Oxygen Delivery Method: Circle system utilized Preoxygenation: Pre-oxygenation with 100% oxygen Induction Type: IV induction Ventilation: Mask ventilation without difficulty Laryngoscope Size: Mac and 3 Grade View: Grade II Tube type: Oral Tube size: 7.0 mm Number of attempts: 1 Airway Equipment and Method: Stylet Placement Confirmation: ETT inserted through vocal cords under direct vision,  positive ETCO2 and breath sounds checked- equal and bilateral Secured at: 23 cm Tube secured with: Tape Dental Injury: Teeth and Oropharynx as per pre-operative assessment

## 2018-02-17 NOTE — Anesthesia Preprocedure Evaluation (Signed)
Anesthesia Evaluation  Patient identified by MRN, date of birth, ID band Patient awake    Reviewed: Allergy & Precautions, NPO status , Patient's Chart, lab work & pertinent test results  History of Anesthesia Complications Negative for: history of anesthetic complications  Airway Mallampati: II  TM Distance: >3 FB Neck ROM: Full    Dental  (+) Teeth Intact, Dental Advisory Given Braces:   Pulmonary neg pulmonary ROS,    breath sounds clear to auscultation       Cardiovascular hypertension,  Rhythm:Regular Rate:Normal  TEE 12/05/17: Study Conclusions - Left ventricle: The cavity size was normal. There was mild concentric hypertrophy. Systolic function was normal. The estimated ejection fraction was in the range of 60% to 65%. Wall motion was normal; there were no regional wall motion abnormalities.     Neuro/Psych PSYCHIATRIC DISORDERS Depression CVA    GI/Hepatic GERD  ,  Endo/Other  diabetesHyperthyroidism Morbid obesity  Renal/GU CRFRenal disease     Musculoskeletal  (+) Fibromyalgia -  Abdominal   Peds  Hematology   Anesthesia Other Findings   Reproductive/Obstetrics                             Anesthesia Physical  Anesthesia Plan  ASA: III  Anesthesia Plan: General   Post-op Pain Management:    Induction: Intravenous  PONV Risk Score and Plan: 3 and Ondansetron, Dexamethasone and Diphenhydramine  Airway Management Planned: LMA  Additional Equipment:   Intra-op Plan:   Post-operative Plan: Extubation in OR  Informed Consent: I have reviewed the patients History and Physical, chart, labs and discussed the procedure including the risks, benefits and alternatives for the proposed anesthesia with the patient or authorized representative who has indicated his/her understanding and acceptance.   Dental advisory given  Plan Discussed with: CRNA and  Anesthesiologist  Anesthesia Plan Comments:         Anesthesia Quick Evaluation

## 2018-02-17 NOTE — Anesthesia Procedure Notes (Signed)
Performed by: Duane Boston, MD

## 2018-02-17 NOTE — Anesthesia Postprocedure Evaluation (Signed)
Anesthesia Post Note  Patient: CHLOE FLIS  Procedure(s) Performed: EXPLORATION OF RIGHT BRACHIAL ARTERY (Right ) REMOVAL OF RIGHT UPPER ARM ARTERIOVENOUS GRAFT (Right Arm Upper)     Patient location during evaluation: PACU Anesthesia Type: General Level of consciousness: sedated Pain management: pain level not controlled Vital Signs Assessment: post-procedure vital signs reviewed and stable Respiratory status: spontaneous breathing and respiratory function stable Cardiovascular status: stable Postop Assessment: no apparent nausea or vomiting Anesthetic complications: no Comments: Pt has no pulse in hand, will need re-operation.    Last Vitals:  Vitals:   02/17/18 1215 02/17/18 1245  BP: (!) 143/89 117/75  Pulse: 83 83  Resp: 17 14  Temp:  36.6 C  SpO2: 100% 94%    Last Pain:  Vitals:   02/17/18 1245  TempSrc:   PainSc: 7                  Nasim Garofano DANIEL

## 2018-02-17 NOTE — Telephone Encounter (Signed)
-----   Message from Mena Goes, RN sent at 02/17/2018  2:10 PM EST ----- Regarding: additional info   ----- Message ----- From: Conrad [place], MD Sent: 02/17/2018  12:04 PM To: [address]  LAURA-LEE VILLEGAS [phone] [birth year]  Change follow-up back to 4 weeks with MD

## 2018-02-17 NOTE — Progress Notes (Addendum)
   Daily Progress Note  Pt with severe pain in R hand, cold hand, and poor motor strength.  Will plan on exploring the right brachial artery.  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  - Recommend: emergency exploration of right brachial artery, possible thromboembolectomy of brachial artery, possible removal of right upper arm arteriovenous graft   Adele Barthel, MD, FACS [facility] [place]: [phone] Pager: [phone]  02/17/2018, 10:33 AM

## 2018-02-17 NOTE — Op Note (Addendum)
OPERATIVE NOTE   PROCEDURE: 1. Vein patch angioplasty right brachial artery 2. Removal of right upper arm arteriovenous graft   PRE-OPERATIVE DIAGNOSIS: severe steal syndrome right hand  POST-OPERATIVE DIAGNOSIS: same as above   SURGEON: Adele Barthel, MD  ASSISTANT(S): Gerri Lins, PAC   ANESTHESIA: general  ESTIMATED BLOOD LOSS: 50 cc  FINDING(S): 1.  Minimal thrombus at arterial anastomosis 2.  Vigorous backbleeding from distal brachial artery 3.  No thrombus retrieved with passing Fogarty distally 4.  Pulsatile proximal brachial artery blood flow 5.  Palpable radial pulse at end of case with monophasic signal 6.  Evidence of some fragmented arterial plaque in brachial artery  SPECIMEN(S):  none  INDICATIONS:   Heather [name] is a 47 y.o. female who presents with severe steal symptoms in her right hand after a right upper arm arteriovenous graft which was just placed.  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  I recommended: emergent exploration of right brachial artery, possible thromboembolectomy, possible removal of right upper arm arteriovenous graft.  No consent was obtained as the patient was sedated and emergent need for this surgery.   DESCRIPTION: After obtaining full informed written consent, the patient was brought back to the operating room and placed supine upon the operating table.  The patient received IV antibiotics prior to induction.  A procedure time out was completed and the correct surgical site was verified.  After obtaining adequate anesthesia, the patient was prepped and draped in the standard fashion for: right arm access procedure.  I took down the Dermabond on the arterial and venous exposures.  The patient was given 11000 units of Heparin intravenously, which was a therapeutic bolus.   I took down sharply the sutures in the arterial exposure.  There remained a pulse in this brachial artery.  I placed vessel loops  proximally and distally.  I sharply took down the arteriovenous graft anastomosis.  Immediately, I saw only minimal amount of thrombus at the suture line.  I relaxed the distal vessel loop and there was vigorous backbleeding from the distal brachial artery.  I passed a 3 Fogarty distally: no thrombus was obtained.  I placed this end under tension.  I then relaxed the proximal end of the brachial artery: pulsatile bleeding was present.  I washed out the arterial lumen.  I could see fragments of an anterior wall plaque were evident.  I harvested an adjacent segment of brachial vein by tying it off proximally and distally with 3-0 silk ties and transecting the segment.  I spatulated this vein segment and then sewed the vein patch onto the brachial artery with a running stitch of 7-0 Prolene.  I removed the distal vessel loops, which revealed a few bleeding points.  This was repaired with 7-0 Prolene stitches.  The remaining vessel loop was removed.    I turned my attention back to the axilla.  I sharply took down the sutures in this incision.  I tied a 2-0 silk around the high brachial vein proximal to the anastomosis.  I transected the high brachial vein and then pulled out the entire arteriovenous graft.   I washed out both exposures and packed both with dry dressings.  60 mg of Protamine was given to reverse anticoagulation.  After waiting a few minutes, no further bleeding was evident.  Each incision was repaired with a layer of 3-0 Vicryl in the subcutaneous tissue and a running subcuticular stitch of 4-0 Monocryl.  The skin in  both incisions was cleaned, dried, and reinforced with Dermabond.  At the end of the case, there remained a palpable radial pulse in the wrist.   COMPLICATIONS: none  CONDITION: stable   Adele Barthel, MD, [facility] [place] Office: [phone] Pager: [phone]  02/17/2018, 11:49 AM

## 2018-02-17 NOTE — Telephone Encounter (Signed)
Moved appt from PA sch to MD sch same day and time 03/15/18 at 2:00. Message already left on pt's cell# informing them of appt.

## 2018-02-17 NOTE — Anesthesia Postprocedure Evaluation (Signed)
Anesthesia Post Note  Patient: [name]  Procedure(s) Performed: INSERTION OF ARTERIOVENOUS (AV) GORE-TEX GRAFT ARM RIGHT UPPER ARM (Right Arm Upper)     Patient location during evaluation: PACU Anesthesia Type: General Level of consciousness: sedated Pain management: pain level not controlled Vital Signs Assessment: post-procedure vital signs reviewed and stable Respiratory status: spontaneous breathing and respiratory function stable Cardiovascular status: stable Postop Assessment: no apparent nausea or vomiting Anesthetic complications: no Comments: Pt will need re-operation.    Last Vitals:  Vitals:   02/17/18 1040 02/17/18 1045  BP: (!) 143/75   Pulse: 85 84  Resp: 16 16  Temp:    SpO2: 100% 100%    Last Pain:  Vitals:   02/17/18 1007  TempSrc:   PainSc: 8                  [name]

## 2018-02-17 NOTE — Progress Notes (Signed)
Dr [name] at bedside unable to palpate right radial pulse can hear with doppler plan is to return pt to OR for exploration of right arm

## 2018-02-18 ENCOUNTER — Encounter (HOSPITAL_COMMUNITY): Payer: Self-pay | Admitting: Vascular Surgery

## 2018-02-18 ENCOUNTER — Other Ambulatory Visit: Payer: Self-pay

## 2018-02-18 ENCOUNTER — Inpatient Hospital Stay (HOSPITAL_COMMUNITY): Payer: Managed Care, Other (non HMO)

## 2018-02-18 DIAGNOSIS — I5030 Unspecified diastolic (congestive) heart failure: Secondary | ICD-10-CM

## 2018-02-18 LAB — ANTITHROMBIN III: ANTITHROMB III FUNC: 100 % (ref 75–120)

## 2018-02-18 LAB — BASIC METABOLIC PANEL
Anion gap: 11 (ref 5–15)
BUN: 34 mg/dL — AB (ref 6–20)
CHLORIDE: 105 mmol/L (ref 101–111)
CO2: 19 mmol/L — ABNORMAL LOW (ref 22–32)
CREATININE: 4.65 mg/dL — AB (ref 0.44–1.00)
Calcium: 8.4 mg/dL — ABNORMAL LOW (ref 8.9–10.3)
GFR calc Af Amer: 12 mL/min — ABNORMAL LOW (ref >60)
GFR calc non Af Amer: 10 mL/min — ABNORMAL LOW (ref >60)
GLUCOSE: 143 mg/dL — AB (ref 65–99)
Potassium: 4.8 mmol/L (ref 3.5–5.1)
SODIUM: 135 mmol/L (ref 135–145)

## 2018-02-18 LAB — ECHOCARDIOGRAM COMPLETE
Height: 69 in
WEIGHTICAEL: 3820.13 [oz_av]

## 2018-02-18 LAB — GLUCOSE, CAPILLARY
Glucose-Capillary: 151 mg/dL — ABNORMAL HIGH (ref 65–99)
Glucose-Capillary: 164 mg/dL — ABNORMAL HIGH (ref 65–99)
Glucose-Capillary: 166 mg/dL — ABNORMAL HIGH (ref 65–99)
Glucose-Capillary: 191 mg/dL — ABNORMAL HIGH (ref 65–99)

## 2018-02-18 LAB — PROTIME-INR
INR: 1.11
Prothrombin Time: 14.2 seconds (ref 11.4–15.2)

## 2018-02-18 MED ORDER — PROMETHAZINE HCL 25 MG/ML IJ SOLN
12.5000 mg | Freq: Four times a day (QID) | INTRAMUSCULAR | Status: DC | PRN
  Administered 2018-02-18 – 2018-02-19 (×3): 12.5 mg via INTRAVENOUS
  Filled 2018-02-18 (×3): qty 1

## 2018-02-18 MED ORDER — SODIUM CHLORIDE 0.9 % IV BOLUS (SEPSIS)
250.0000 mL | Freq: Once | INTRAVENOUS | Status: AC
  Administered 2018-02-18: 250 mL via INTRAVENOUS

## 2018-02-18 MED ORDER — DIPHENHYDRAMINE HCL 25 MG PO CAPS
25.0000 mg | ORAL_CAPSULE | Freq: Three times a day (TID) | ORAL | Status: DC | PRN
  Administered 2018-02-18 – 2018-02-20 (×4): 25 mg via ORAL
  Filled 2018-02-18 (×4): qty 1

## 2018-02-18 NOTE — Progress Notes (Signed)
PHARMACY NOTE:  ANTIMICROBIAL RENAL DOSAGE ADJUSTMENT  Current antimicrobial regimen includes a mismatch between antimicrobial dosage and estimated renal function.  As per policy approved by the Pharmacy & Therapeutics and Medical Executive Committees, the antimicrobial dosage will be adjusted accordingly.  Current antimicrobial dosage:  zinacef 1.5gm IV x2 doses  Indication: surgical prophylaxis  Renal Function:  Estimated Creatinine Clearance: 19.4 mL/min (A) (by C-G formula based on SCr of 4.65 mg/dL (H)).   Antimicrobial dosage has been changed to:  Zinacef 1.5gm IV x1    Thank you for allowing pharmacy to be a part of this patient's care.   Hildred Laser, Pharm D 02/18/2018 1:22 PM

## 2018-02-18 NOTE — Progress Notes (Signed)
  Progress Note    02/18/2018 10:58 AM 1 Day Post-Op  Subjective:  Nausea and vomited this morning, right arm has pain at incision sites but no hand pain  Vitals:   02/17/18 2349 02/18/18 0446  BP: (!) 159/84 122/70  Pulse: 90 88  Resp: (!) 21 11  Temp: 98.3 F (36.8 C) 98.3 F (36.8 C)  SpO2: 94% 92%    Physical Exam: aaox3 Right upper arm is edematous, incisions are cdi Strong radial, ulnar, and palmar arch signals and hand is warm Drain is in place with 20cc serosanguinous output  CBC    Component Value Date/Time   WBC 8.4 02/17/2018 2338   RBC 3.28 (L) 02/17/2018 2338   HGB 9.7 (L) 02/17/2018 2338   HCT 31.8 (L) 02/17/2018 2338   PLT 326 02/17/2018 2338   MCV 97.0 02/17/2018 2338   MCH 29.6 02/17/2018 2338   MCHC 30.5 02/17/2018 2338   RDW 13.6 02/17/2018 2338   LYMPHSABS 1.8 12/02/2017 1330   MONOABS 0.7 12/02/2017 1330   EOSABS 0.1 12/02/2017 1330   BASOSABS 0.0 12/02/2017 1330    BMET    Component Value Date/Time   NA 135 02/17/2018 2338   NA 136 (A) 02/01/2018   K 4.8 02/17/2018 2338   K 4.3 10/16/2013   CL 105 02/17/2018 2338   CL 100 10/16/2013   CO2 19 (L) 02/17/2018 2338   GLUCOSE 143 (H) 02/17/2018 2338   BUN 34 (H) 02/17/2018 2338   BUN 36 (A) 02/01/2018   CREATININE 4.65 (H) 02/17/2018 2338   CREATININE 3.05 (H) 09/03/2015 0855   CALCIUM 8.4 (L) 02/17/2018 2338   CALCIUM 8.9 10/16/2013   GFRNONAA 10 (L) 02/17/2018 2338   GFRNONAA 18 (L) 09/03/2015 0855   GFRAA 12 (L) 02/17/2018 2338   GFRAA 20 (L) 09/03/2015 0855    INR    Component Value Date/Time   INR 1.11 02/17/2018 2338     Intake/Output Summary (Last 24 hours) at 02/18/2018 1058 Last data filed at 02/17/2018 2000 Gross per 24 hour  Intake 1030 ml  Output 35 ml  Net 995 ml     Assessment:  47 y.o. female is s/p right arm av grafting with subsequent removal for steal and brachial artery embolectomy  Plan: Echo this a.m. Hypercoagulable workup is pending CT angio  of chest to look for proximal source Will gently hydrate with ivf   Heather [name] Matters, MD [facility] [place] Office: [phone] Pager: [phone]  02/18/2018 10:58 AM

## 2018-02-18 NOTE — Progress Notes (Signed)
  Echocardiogram 2D Echocardiogram has been performed.  Merrie Roof F 02/18/2018, 9:17 AM

## 2018-02-18 NOTE — Plan of Care (Signed)
  Progressing Education: Knowledge of General Education information will improve 02/18/2018 2323 - Progressing by Blair Promise, RN Health Behavior/Discharge Planning: Ability to manage health-related needs will improve 02/18/2018 2323 - Progressing by Blair Promise, RN Clinical Measurements: Ability to maintain clinical measurements within normal limits will improve 02/18/2018 2323 - Progressing by Blair Promise, RN Will remain free from infection 02/18/2018 2323 - Progressing by Blair Promise, RN Diagnostic test results will improve 02/18/2018 2323 - Progressing by Blair Promise, RN Respiratory complications will improve 02/18/2018 2323 - Progressing by Blair Promise, RN Cardiovascular complication will be avoided 02/18/2018 2323 - Progressing by Blair Promise, RN Activity: Risk for activity intolerance will decrease 02/18/2018 2323 - Progressing by Blair Promise, RN Nutrition: Adequate nutrition will be maintained 02/18/2018 2323 - Progressing by Blair Promise, RN Coping: Level of anxiety will decrease 02/18/2018 2323 - Progressing by Blair Promise, RN Elimination: Will not experience complications related to bowel motility 02/18/2018 2323 - Progressing by Blair Promise, RN Will not experience complications related to urinary retention 02/18/2018 2323 - Progressing by Blair Promise, RN Pain Managment: General experience of comfort will improve 02/18/2018 2323 - Progressing by Blair Promise, RN Safety: Ability to remain free from injury will improve 02/18/2018 2323 - Progressing by Blair Promise, RN Skin Integrity: Risk for impaired skin integrity will decrease 02/18/2018 2323 - Progressing by Blair Promise, RN

## 2018-02-18 NOTE — Plan of Care (Signed)
  Progressing Education: Knowledge of General Education information will improve 02/18/2018 0125 - Progressing by Blair Promise, RN Health Behavior/Discharge Planning: Ability to manage health-related needs will improve 02/18/2018 0125 - Progressing by Blair Promise, RN Clinical Measurements: Ability to maintain clinical measurements within normal limits will improve 02/18/2018 0125 - Progressing by Blair Promise, RN Will remain free from infection 02/18/2018 0125 - Progressing by Blair Promise, RN Diagnostic test results will improve 02/18/2018 0125 - Progressing by Blair Promise, RN Respiratory complications will improve 02/18/2018 0125 - Progressing by Blair Promise, RN Cardiovascular complication will be avoided 02/18/2018 0125 - Progressing by Blair Promise, RN Activity: Risk for activity intolerance will decrease 02/18/2018 0125 - Progressing by Blair Promise, RN Nutrition: Adequate nutrition will be maintained 02/18/2018 0125 - Progressing by Blair Promise, RN Coping: Level of anxiety will decrease 02/18/2018 0125 - Progressing by Blair Promise, RN Elimination: Will not experience complications related to bowel motility 02/18/2018 0125 - Progressing by Blair Promise, RN Will not experience complications related to urinary retention 02/18/2018 0125 - Progressing by Blair Promise, RN Pain Managment: General experience of comfort will improve 02/18/2018 0125 - Progressing by Blair Promise, RN Safety: Ability to remain free from injury will improve 02/18/2018 0125 - Progressing by Blair Promise, RN Skin Integrity: Risk for impaired skin integrity will decrease 02/18/2018 0125 - Progressing by Blair Promise, RN

## 2018-02-19 LAB — PROTEIN C ACTIVITY: Protein C Activity: 143 % (ref 73–180)

## 2018-02-19 LAB — CBC WITH DIFFERENTIAL/PLATELET
Basophils Absolute: 0 10*3/uL (ref 0.0–0.1)
Basophils Relative: 0 %
Eosinophils Absolute: 0.2 10*3/uL (ref 0.0–0.7)
Eosinophils Relative: 3 %
HEMATOCRIT: 29 % — AB (ref 36.0–46.0)
HEMOGLOBIN: 8.8 g/dL — AB (ref 12.0–15.0)
LYMPHS ABS: 1.8 10*3/uL (ref 0.7–4.0)
Lymphocytes Relative: 26 %
MCH: 29.9 pg (ref 26.0–34.0)
MCHC: 30.3 g/dL (ref 30.0–36.0)
MCV: 98.6 fL (ref 78.0–100.0)
MONOS PCT: 8 %
Monocytes Absolute: 0.6 10*3/uL (ref 0.1–1.0)
NEUTROS ABS: 4.5 10*3/uL (ref 1.7–7.7)
NEUTROS PCT: 63 %
Platelets: 325 10*3/uL (ref 150–400)
RBC: 2.94 MIL/uL — AB (ref 3.87–5.11)
RDW: 14 % (ref 11.5–15.5)
WBC: 7.1 10*3/uL (ref 4.0–10.5)

## 2018-02-19 LAB — BASIC METABOLIC PANEL
Anion gap: 10 (ref 5–15)
BUN: 39 mg/dL — ABNORMAL HIGH (ref 6–20)
CHLORIDE: 104 mmol/L (ref 101–111)
CO2: 21 mmol/L — AB (ref 22–32)
CREATININE: 6.06 mg/dL — AB (ref 0.44–1.00)
Calcium: 8 mg/dL — ABNORMAL LOW (ref 8.9–10.3)
GFR calc non Af Amer: 7 mL/min — ABNORMAL LOW (ref >60)
GFR, EST AFRICAN AMERICAN: 9 mL/min — AB (ref >60)
Glucose, Bld: 186 mg/dL — ABNORMAL HIGH (ref 65–99)
POTASSIUM: 5.1 mmol/L (ref 3.5–5.1)
Sodium: 135 mmol/L (ref 135–145)

## 2018-02-19 LAB — GLUCOSE, CAPILLARY
Glucose-Capillary: 188 mg/dL — ABNORMAL HIGH (ref 65–99)
Glucose-Capillary: 189 mg/dL — ABNORMAL HIGH (ref 65–99)
Glucose-Capillary: 163 mg/dL — ABNORMAL HIGH (ref 65–99)
Glucose-Capillary: 270 mg/dL — ABNORMAL HIGH (ref 65–99)

## 2018-02-19 LAB — HEMOGLOBIN A1C
HEMOGLOBIN A1C: 6.6 % — AB (ref 4.8–5.6)
MEAN PLASMA GLUCOSE: 143 mg/dL

## 2018-02-19 LAB — PROTEIN S, TOTAL: Protein S Ag, Total: 114 % (ref 60–150)

## 2018-02-19 LAB — HIV ANTIBODY (ROUTINE TESTING W REFLEX): HIV Screen 4th Generation wRfx: NONREACTIVE

## 2018-02-19 LAB — HEPARIN INDUCED PLATELET AB (HIT ANTIBODY): Heparin Induced Plt Ab: 0.162 OD (ref 0.000–0.400)

## 2018-02-19 LAB — PROTEIN S ACTIVITY: Protein S Activity: 80 % (ref 63–140)

## 2018-02-19 MED ORDER — OXYCODONE-ACETAMINOPHEN 5-325 MG PO TABS: 1.0000 | ORAL_TABLET | Freq: Four times a day (QID) | ORAL | 0 refills | Status: DC | PRN

## 2018-02-19 NOTE — Progress Notes (Signed)
Patient A/O x 4; VSS, No c/o of pain; swelling in right arm, movement present; Will continue to monitor

## 2018-02-19 NOTE — Progress Notes (Addendum)
Progress Note    02/19/2018 7:45 AM 2 Days Post-Op  Subjective:  C/o pain around the incision and swelling in the arm/hand  Afebrile HR 80's-90's  518'A-416'S systolic 06% RA  Vitals:   02/19/18 0016 02/19/18 0413  BP: 128/69 125/67  Pulse: 91 89  Resp: 14 17  Temp: 98.7 F (37.1 C) 98.5 F (36.9 C)  SpO2: 94% 93%    Physical Exam: Cardiac:  regular Lungs:  Non labored Incisions:  Clean and dry; tender to touch lateral to incision Extremities:  Easily palpable right radial pulse   CBC    Component Value Date/Time   WBC 8.4 02/17/2018 2338   RBC 3.28 (L) 02/17/2018 2338   HGB 9.7 (L) 02/17/2018 2338   HCT 31.8 (L) 02/17/2018 2338   PLT 326 02/17/2018 2338   MCV 97.0 02/17/2018 2338   MCH 29.6 02/17/2018 2338   MCHC 30.5 02/17/2018 2338   RDW 13.6 02/17/2018 2338   LYMPHSABS 1.8 12/02/2017 1330   MONOABS 0.7 12/02/2017 1330   EOSABS 0.1 12/02/2017 1330   BASOSABS 0.0 12/02/2017 1330    BMET    Component Value Date/Time   NA 135 02/17/2018 2338   NA 136 (A) 02/01/2018   K 4.8 02/17/2018 2338   K 4.3 10/16/2013   CL 105 02/17/2018 2338   CL 100 10/16/2013   CO2 19 (L) 02/17/2018 2338   GLUCOSE 143 (H) 02/17/2018 2338   BUN 34 (H) 02/17/2018 2338   BUN 36 (A) 02/01/2018   CREATININE 4.65 (H) 02/17/2018 2338   CREATININE 3.05 (H) 09/03/2015 0855   CALCIUM 8.4 (L) 02/17/2018 2338   CALCIUM 8.9 10/16/2013   GFRNONAA 10 (L) 02/17/2018 2338   GFRNONAA 18 (L) 09/03/2015 0855   GFRAA 12 (L) 02/17/2018 2338   GFRAA 20 (L) 09/03/2015 0855    INR    Component Value Date/Time   INR 1.11 02/17/2018 2338     Intake/Output Summary (Last 24 hours) at 02/19/2018 0745 Last data filed at 02/18/2018 1300 Gross per 24 hour  Intake 240 ml  Output -  Net 240 ml    Echocardiogram 02/18/18 Study Conclusions  - Left ventricle: The cavity size was normal. Wall thickness was   increased in a pattern of moderate LVH. Systolic function was   normal. The  estimated ejection fraction was in the range of 60%   to 65%. Wall motion was normal; there were no regional wall   motion abnormalities. Doppler parameters are consistent with   abnormal left ventricular relaxation (grade 1 diastolic   dysfunction). Indeterminate filling pressures. - Pericardium, extracardiac: A trivial pericardial effusion was   identified posterior to the heart.  Assessment:  47 y.o. female is s/p:  right upper arm arteriovenous graft And 1. Vein patch angioplasty right brachial artery 2. Removal of right upper arm arteriovenous graft  And 1. Thromboembolectomy right brachial artery, radial artery and ulnar artery 2 Days Post-Op   Plan: -pt with a palpable right radial pulse this am -incision is clean and dry-she has pain lateral to the incision and tender to touch -instructed pt to keep hand elevated if she can for swelling -CT scan was not completed due to renal insufficiency.  Echo revealed EF of 60-65%; there were no cardiac masses present -dc drain -most likely home later today vs tomorrow -pt has appt with Dr. [name] on 03/01/18 at [place], PA-C [facility] [phone] 02/19/2018 7:45 AM   I have interviewed and  examined patient with PA and agree with assessment and plan above. She still has pain in her right arm but pulses are palpable. Bmp and cbc sent today. Thrombophilia panel is pending.  Alyx Gee C. Donzetta Matters, MD [facility] [place] Office: [phone] Pager: [phone]

## 2018-02-19 NOTE — Plan of Care (Signed)
  Progressing Education: Knowledge of General Education information will improve 02/19/2018 2239 - Progressing by Blair Promise, RN Health Behavior/Discharge Planning: Ability to manage health-related needs will improve 02/19/2018 2239 - Progressing by Blair Promise, RN Clinical Measurements: Ability to maintain clinical measurements within normal limits will improve 02/19/2018 2239 - Progressing by Blair Promise, RN Will remain free from infection 02/19/2018 2239 - Progressing by Blair Promise, RN Diagnostic test results will improve 02/19/2018 2239 - Progressing by Blair Promise, RN Respiratory complications will improve 02/19/2018 2239 - Progressing by Blair Promise, RN Cardiovascular complication will be avoided 02/19/2018 2239 - Progressing by Blair Promise, RN Activity: Risk for activity intolerance will decrease 02/19/2018 2239 - Progressing by Blair Promise, RN Nutrition: Adequate nutrition will be maintained 02/19/2018 2239 - Progressing by Blair Promise, RN Coping: Level of anxiety will decrease 02/19/2018 2239 - Progressing by Blair Promise, RN Elimination: Will not experience complications related to bowel motility 02/19/2018 2239 - Progressing by Blair Promise, RN Will not experience complications related to urinary retention 02/19/2018 2239 - Progressing by Blair Promise, RN Pain Managment: General experience of comfort will improve 02/19/2018 2239 - Progressing by Blair Promise, RN Safety: Ability to remain free from injury will improve 02/19/2018 2239 - Progressing by Blair Promise, RN Skin Integrity: Risk for impaired skin integrity will decrease 02/19/2018 2239 - Progressing by Blair Promise, RN

## 2018-02-20 ENCOUNTER — Encounter (HOSPITAL_COMMUNITY): Payer: Self-pay | Admitting: Vascular Surgery

## 2018-02-20 LAB — DRVVT MIX: dRVVT Mix: 41 s (ref 0.0–47.0)

## 2018-02-20 LAB — GLUCOSE, CAPILLARY
Glucose-Capillary: 168 mg/dL — ABNORMAL HIGH (ref 65–99)
Glucose-Capillary: 160 mg/dL — ABNORMAL HIGH (ref 65–99)

## 2018-02-20 LAB — LUPUS ANTICOAGULANT PANEL
DRVVT: 48.5 s — ABNORMAL HIGH (ref 0.0–47.0)
PTT LA: 27.8 s (ref 0.0–51.9)

## 2018-02-20 LAB — HOMOCYSTEINE: Homocysteine: 34.2 umol/L — ABNORMAL HIGH (ref 0.0–15.0)

## 2018-02-20 LAB — PROTEIN C, TOTAL: PROTEIN C, TOTAL: 113 % (ref 60–150)

## 2018-02-20 NOTE — Progress Notes (Signed)
Patient in a stable condition, discharge education reviewed with patient she verbalized understanding, iv removed, tele dc ccmd notified, patient belongings at bedside, paper prescription given to patient, patient awaiting her mother for transportation home.

## 2018-02-20 NOTE — Progress Notes (Signed)
[facility] [place]  Subjective  - Tender at incisions.   Objective 125/74 80 97.6 F (36.4 C) (Oral) (!) 23 96%  Intake/Output Summary (Last 24 hours) at 02/20/2018 2548 Last data filed at 02/19/2018 1700 Gross per 24 hour  Intake 360 ml  Output 800 ml  Net -440 ml    Palpable right radial pulse Hand warm sensation intact.  Painful to move at elbow and shoulder because of incisional pain Heart RRR Lungs non labored breathing    Assessment/Planning: S/P right AV graft with steal followed up with repair, thrombectomy of the radial and ulnar artery.  Arterial flow patent with palpable radial pulse and doppler ulnar and palmer. Plan she will be discharged home and follow up with Dr. [name] to discuss next option of HD access.   Heather [name] 02/20/2018 7:12 AM --  Laboratory Lab Results: Recent Labs    02/17/18 2338 02/19/18 1847  WBC 8.4 7.1  HGB 9.7* 8.8*  HCT 31.8* 29.0*  PLT 326 325   BMET Recent Labs    02/17/18 2338 02/19/18 1847  NA 135 135  K 4.8 5.1  CL 105 104  CO2 19* 21*  GLUCOSE 143* 186*  BUN 34* 39*  CREATININE 4.65* 6.06*  CALCIUM 8.4* 8.0*    COAG Lab Results  Component Value Date   INR 1.11 02/17/2018   INR 1.00 04/19/2014   INR 1.1 09/16/2008   No results found for: PTT

## 2018-02-21 ENCOUNTER — Ambulatory Visit: Payer: Managed Care, Other (non HMO) | Admitting: Family Medicine

## 2018-02-21 DIAGNOSIS — Z0289 Encounter for other administrative examinations: Secondary | ICD-10-CM

## 2018-02-21 LAB — CARDIOLIPIN ANTIBODIES, IGG, IGM, IGA
Anticardiolipin IgA: <9 APL U/mL (ref 0–11)
Anticardiolipin IgG: <9 GPL U/mL (ref 0–14)
Anticardiolipin IgM: <9 MPL U/mL (ref 0–12)

## 2018-02-21 NOTE — Discharge Summary (Addendum)
[facility] Discharge Summary   Patient ID:  Heather [name] MRN: [id number] DOB/AGE: May 09, 1971 47 y.o.  Admit date: 02/17/2018 Discharge date: 02/20/2018 Date of Surgery: 02/17/2018 Surgeon: Juliann Mule): Conrad [place], MD  Admission Diagnosis: Guy ACCESS  Discharge Diagnoses:  CHRONIC KIDNEY DISEASE FOR HEMODILAYSIS ACCESS  Secondary Diagnoses: Past Medical History:  Diagnosis Date  . Allergy   . Anemia of chronic disease   . B12 deficiency 05/10/2014  . Blood transfusion without reported diagnosis   . Bronchitis   . Chronic constipation   . Chronic kidney disease   . Constipation   . CVA (cerebral vascular accident) ([name]) 12/03/2017   nonhemorrhagic infarct-- inferior right cerebellum  . Diabetic neuropathy, painful ([name]) 07/13/2013   Stop lyrica due to cost & SE of drowsy. Gabapentin helps Stay off hydrocodone: pt states she thinks "she was addicted" to med. "I don't want that anymore."   . DM2 (diabetes mellitus, type 2) ([place])   . Dyspnea    with exertion  . Fibromyalgia   . Gastroparesis due to DM   . GERD (gastroesophageal reflux disease)   . Headache   . Heart murmur, systolic 32/08/5187   Grade 1/6 ejection murmur per [facility] notes.   . Hyperlipemia   . Hypertension   . IBS (irritable bowel syndrome)   . Infertility   . Leiomyoma of uterus   . Morbid obesity due to excess calories ([place]) 11/16/2016  . Neuropathy   . Obesity   . Pancreatitis   . Pneumonia   . 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 Right-sided low back pain without sciatica 12/08/2015  . Secondary hyperthyroidism 11/27/2013   Followed by [facility], notes reviewed 01/01/15. PTH had improved with oral vitamin D: 133 to 85. Last 155-probably not taking vit D.    [name] Upper airway cough syndrome 11/15/2016   rec off acei/ on pepcid hs 11/15/2016 >>>     Procedure(s): BRACHIAL ARTERY EXPLORATION AND  TRHOMBECTOMY  Discharged Condition: stable  HPI: 47 Y/O female with ESRD in need of permanent access.  Previous access procedures have been completed in the left arm.  The patient's complication from previous access procedures include: thrombosis.       Hospital Course:  Heather [name] is a 47 y.o. female is S/P Right Procedure(s): Placement of right arm arteriovenous graft She developed steal symptoms post op.  Returned to OR Thrombectomy and removal of graft.    Returned a third time secondary to loss of Radial pulse.   BRACHIAL ARTERY EXPLORATION AND TRHOMBECTOMY.   She was worked up for possible HITT the third OR visit we did not use Heparin.  We used 10 mg of argatroban for anticoagulation intraoperative.  TLS drain was placed.    Post day 1 20 cc SS.  Echocardiogram 02/18/18 Study Conclusions  - Left ventricle: The cavity size was normal. Wall thickness was increased in a pattern of moderate LVH. Systolic function was normal. The estimated ejection fraction was in the range of 60% to 65%. Wall motion was normal; there were no regional wall motion abnormalities. Doppler parameters are consistent with abnormal left ventricular relaxation (grade 1 diastolic dysfunction). Indeterminate filling pressures. - Pericardium, extracardiac: A trivial pericardial effusion was identified posterior to the heart.  She regained an easily palpable radial pulse right UE. Drain discontinued. Coagulopathy work up was negative.   She will be discharged home and follow up with Dr. [name] for future access  planning.    Significant Diagnostic Studies: CBC Lab Results  Component Value Date   WBC 7.1 02/19/2018   HGB 8.8 (L) 02/19/2018   HCT 29.0 (L) 02/19/2018   MCV 98.6 02/19/2018   PLT 325 02/19/2018    BMET    Component Value Date/Time   NA 135 02/19/2018 1847   NA 136 (A) 02/01/2018   K 5.1 02/19/2018 1847   K 4.3 10/16/2013   CL 104 02/19/2018 1847   CL 100  10/16/2013   CO2 21 (L) 02/19/2018 1847   GLUCOSE 186 (H) 02/19/2018 1847   BUN 39 (H) 02/19/2018 1847   BUN 36 (A) 02/01/2018   CREATININE 6.06 (H) 02/19/2018 1847   CREATININE 3.05 (H) 09/03/2015 0855   CALCIUM 8.0 (L) 02/19/2018 1847   CALCIUM 8.9 10/16/2013   GFRNONAA 7 (L) 02/19/2018 1847   GFRNONAA 18 (L) 09/03/2015 0855   GFRAA 9 (L) 02/19/2018 1847   GFRAA 20 (L) 09/03/2015 0855   COAG Lab Results  Component Value Date   INR 1.11 02/17/2018   INR 1.00 04/19/2014   INR 1.1 09/16/2008     Disposition:  Discharge to :Home Discharge Instructions    Call MD for:  redness, tenderness, or signs of infection (pain, swelling, bleeding, redness, odor or green/yellow discharge around incision site)   Complete by:  As directed    Call MD for:  severe or increased pain, loss or decreased feeling  in affected limb(s)   Complete by:  As directed    Call MD for:  temperature >100.5   Complete by:  As directed    Discharge patient   Complete by:  As directed    Discharge disposition:  01-Home or Self Care   Discharge patient date:  02/20/2018   Resume previous diet   Complete by:  As directed      Allergies as of 02/20/2018      Reactions   Ibuprofen Other (See Comments)   CKD stage 3. Should avoid.   Dilaudid [hydromorphone Hcl] Itching, Other (See Comments)   Can take with Benadryl.      Medication List    STOP taking these medications   diazepam 5 MG tablet Commonly known as:  VALIUM   fluticasone 220 MCG/ACT inhaler Commonly known as:  FLOVENT HFA   fluticasone 50 MCG/ACT nasal spray Commonly known as:  FLONASE   LINZESS 145 MCG Caps capsule Generic drug:  linaclotide   ondansetron 4 MG tablet Commonly known as:  ZOFRAN   ondansetron 8 MG disintegrating tablet Commonly known as:  ZOFRAN ODT   sertraline 25 MG tablet Commonly known as:  ZOLOFT   torsemide 20 MG tablet Commonly known as:  DEMADEX     TAKE these medications   amLODipine 10 MG  tablet Commonly known as:  NORVASC Take 10 mg by mouth at bedtime.   aspirin EC 81 MG tablet Take 1 tablet (81 mg total) by mouth daily.   calcitRIOL 0.25 MCG capsule Commonly known as:  ROCALTROL Take 0.75 mcg by mouth daily.   carvedilol 12.5 MG tablet Commonly known as:  COREG Take 12.5 mg by mouth daily.   clopidogrel 75 MG tablet Commonly known as:  PLAVIX Take 1 tablet (75 mg total) by mouth daily.   epoetin alfa 10000 UNIT/ML injection Commonly known as:  EPOGEN,PROCRIT Inject 30,000 Units into the vein every 14 (fourteen) days.   furosemide 80 MG tablet Commonly known as:  LASIX Take 160 mg by mouth daily.  gabapentin 300 MG capsule Commonly known as:  NEURONTIN Take 1 capsule (300 mg total) by mouth 2 (two) times daily.   NOVOLOG 100 UNIT/ML injection Generic drug:  insulin aspart Inject 6-8 Units into the skin 3 (three) times daily before meals. 8 UNITS WITH BREAKFAST, 6 UNITS WITH LUNCH, & 8 UNITS WITH SUPPER   oxyCODONE-acetaminophen 5-325 MG tablet Commonly known as:  PERCOCET/ROXICET Take 1 tablet by mouth every 6 (six) hours as needed.   simvastatin 10 MG tablet Commonly known as:  ZOCOR Take 0.5 tablets (5 mg total) by mouth at bedtime. 1/2 po q hs - GFR 14 What changed:    how much to take  additional instructions   TRESIBA FLEXTOUCH 100 UNIT/ML Sopn FlexTouch Pen Generic drug:  insulin degludec Inject 28 Units into the skin daily with lunch.      Verbal and written Discharge instructions given to the patient. Wound care per Discharge AVS Follow-up Information    Conrad [name], MD Follow up on 03/01/2018.   Specialties:  Vascular Surgery, Cardiology Why:  Appointment time is at 1:30pm Contact information: [place] [address] [phone]           Signed: Roxy Horseman 02/21/2018, 10:30 AM   Addendum  SHELVIE SALSBERRY is a 47 y.o. (10-30-1971) female presented for right arm arteriovenous graft placement.   She had previously had an arteriovenous fistula and arteriovenous graft with Artegraft attempted in the left arm.  Both of these fails, so I felt that an attempt at placement of Right arm arteriovenous graft with PTFE was indicated.  This procedure was completed without difficulty.  The brachial artery appeared to be somewhat diseased intraoperatively.  I communicated my concerns to the patient's mother post-operatively.  Immediately post-operatively the patient began have severe pain in her right arm, consistent with severe steal syndrome.  The patient went back to the operating room and had the arteriovenous graft was removed.  I vein patched the brachial artery and ligated the proximal brachial vein used for the arteriovenous graft.  I had also passed a Fogarty and obtained no thrombus in the arteriovenous graft or brachial artery.  The patient had a palpable radial pulse in the right arm at the completion of this case.  Again in the holding area, the patient lost her radial pulse and continued to have ischemic symptoms.  The patient went back to the operating room again and this time, a wide exposure of the brachial artery down to its bifurcation into ulnar and radial artery were completed.  The patient was anticoagulated with Argatroban in case the patient had HITT.  There was no thrombus in the radial artery, but there was a small amount in the ulnar artery.  Surprisingly there was also thrombus in the brachial distal to the vein patch on the artery.  The thrombus which was removed was sub-acute in appearance and not consistent with what I would have expected.  A TLS drain was placed as a precaution in the event full anticoagulation was necessary.    In the post-operative period, this patient had screening studies for thrombophilia, HITT, and transthoracic echocardiogram.  A CTA Chest was ordered but could not be done due to chronic kidney disease stage IV-V.  None of the lab studies suggest thrombophilia at  this point, so the etiology of the thrombus in the right arm remains unknown.  At discharge, the patient had a palpable radial and brachial pulse with some residual pain in  right hand.   Adele Barthel, MD, FACS [facility] [place] Office: [phone] Pager: [phone]  02/24/2018, 12:19 PM

## 2018-02-21 NOTE — Progress Notes (Deleted)
Heather [name] is a 47 y.o. female is here for follow up.  History of Present Illness:   {CMA SCRIBE ATTESTATION}  HPI:   No diagnosis found.  ROS  Health Maintenance Due  Topic Date Due  . URINE MICROALBUMIN  04/06/2016  . OPHTHALMOLOGY EXAM  08/05/2017   Depression screen [facility] 2/9 01/02/2018 12/08/2017 06/30/2017  Decreased Interest 1 1 0  Down, Depressed, Hopeless 0 3 0  PHQ - 2 Score 1 4 0  Altered sleeping 3 3 -  Tired, decreased energy 3 2 -  Change in appetite 1 3 -  Feeling bad or failure about yourself  0 2 -  Trouble concentrating 0 1 -  Moving slowly or fidgety/restless 1 1 -  Suicidal thoughts 0 1 -  PHQ-9 Score 9 17 -  Some recent data might be hidden   PMHx, SurgHx, SocialHx, FamHx, Medications, and Allergies were reviewed in the Visit Navigator and updated as appropriate.   Patient Active Problem List   Diagnosis Date Noted  . Steal syndrome as complication of dialysis access ([place]) 02/17/2018  . Cyst of left ovary 01/12/2018  . Dysmenorrhea 01/12/2018  . Insomnia 01/12/2018  . Menorrhagia 01/12/2018  . 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 Vitamin D deficiency 01/02/2018  . Bilateral lower extremity edema 01/02/2018  . Reactive depression 12/10/2017  . Left-sided weakness 12/10/2017  . Stroke (cerebrum) ([place]) 12/02/2017  . Dysfunction of left eustachian tube 11/24/2017  . Arthralgia of right temporomandibular joint 10/24/2017  . Morbid obesity due to excess calories ([place]) 11/16/2016  . Upper airway cough syndrome 11/15/2016  . DM (diabetes mellitus), type 2, uncontrolled, with renal complications ([place]) 32/67/1245  . Dyslipidemia associated with type 2 diabetes mellitus ([place]) 06/25/2016  . Uncontrolled type 2 diabetes mellitus with both eyes affected by proliferative retinopathy and macular edema, with long-term current use of insulin ([place]) 06/22/2016  . Uncontrolled type 2 diabetes mellitus with diabetic polyneuropathy, with long-term current  use of insulin ([place]) 03/08/2016  . Uncontrolled type 2 diabetes mellitus with stage 4 chronic kidney disease, with long-term current use of insulin ([place]) 03/08/2016  . Right-sided low back pain without sciatica 12/08/2015  . Proliferative diabetic retinopathy ([place]) 09/29/2015  . Pseudophakia of both eyes 05/30/2015  . 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 B12 deficiency 05/10/2014  . Anemia in chronic kidney disease 04/26/2014  . CKD (chronic kidney disease) stage 5, GFR less than 15 ml/min (HCC) 04/26/2014  . Heart murmur, systolic 80/99/8338  . Secondary hyperparathyroidism ([place]) 11/27/2013  . Posterior subcapsular cataract, bilateral 10/18/2013  . Diabetic neuropathy, painful ([place]) 07/13/2013  . Diabetes mellitus, type II, insulin dependent ([place]) 02/19/2011  . Essential hypertension 02/19/2011  . Gastroparesis 02/19/2011   Social History   Tobacco Use  . Smoking status: Never Smoker  . Smokeless tobacco: Never Used  Substance Use Topics  . Alcohol use: No    Alcohol/week: 0.0 oz  . Drug use: No   Current Medications and Allergies:   Current Outpatient Medications:  .  amLODipine (NORVASC) 10 MG tablet, Take 10 mg by mouth at bedtime., Disp: , Rfl:  .  aspirin EC 81 MG tablet, Take 1 tablet (81 mg total) by mouth daily., Disp: 90 tablet, Rfl: 0 .  calcitRIOL (ROCALTROL) 0.25 MCG capsule, Take 0.75 mcg by mouth daily. , Disp: , Rfl:  .  carvedilol (COREG) 12.5 MG tablet, Take 12.5 mg by mouth daily., Disp: , Rfl:  .  clopidogrel (PLAVIX) 75 MG  tablet, Take 1 tablet (75 mg total) by mouth daily., Disp: 90 tablet, Rfl: 3 .  epoetin alfa (EPOGEN,PROCRIT) 87867 UNIT/ML injection, Inject 30,000 Units into the vein every 14 (fourteen) days. , Disp: , Rfl:  .  furosemide (LASIX) 80 MG tablet, Take 160 mg by mouth daily., Disp: , Rfl:  .  gabapentin (NEURONTIN) 300 MG capsule, Take 1 capsule (300 mg total) by mouth 2 (two) times daily., Disp: 180 capsule, Rfl: 1 .  insulin  aspart (NOVOLOG) 100 UNIT/ML injection, Inject 6-8 Units into the skin 3 (three) times daily before meals. 8 UNITS WITH BREAKFAST, 6 UNITS WITH LUNCH, & 8 UNITS WITH SUPPER, Disp: , Rfl:  .  insulin degludec (TRESIBA FLEXTOUCH) 100 UNIT/ML SOPN FlexTouch Pen, Inject 28 Units into the skin daily with lunch. , Disp: , Rfl:  .  oxyCODONE-acetaminophen (PERCOCET/ROXICET) 5-325 MG tablet, Take 1 tablet by mouth every 6 (six) hours as needed., Disp: 20 tablet, Rfl: 0 .  simvastatin (ZOCOR) 10 MG tablet, Take 0.5 tablets (5 mg total) by mouth at bedtime. 1/2 po q hs - GFR 14 (Patient taking differently: Take 10 mg by mouth at bedtime. ), Disp: 45 tablet, Rfl: 1  Allergies  Allergen Reactions  . Ibuprofen Other (See Comments)    CKD stage 3. Should avoid.  . Dilaudid [Hydromorphone Hcl] Itching and Other (See Comments)    Can take with Benadryl.   Review of Systems   Pertinent items are noted in the HPI. Otherwise, ROS is negative.  Vitals:  There were no vitals filed for this visit.   There is no height or weight on file to calculate BMI.  Physical Exam:   Physical Exam Results for orders placed or performed during the hospital encounter of 02/17/18  hCG, serum, qualitative  Result Value Ref Range   Preg, Serum NEGATIVE NEGATIVE  Glucose, capillary  Result Value Ref Range   Glucose-Capillary 151 (H) 65 - 99 mg/dL   Comment 1 Notify RN    Comment 2 Document in Chart   Glucose, capillary  Result Value Ref Range   Glucose-Capillary 190 (H) 65 - 99 mg/dL   Comment 1 Notify RN   Glucose, capillary  Result Value Ref Range   Glucose-Capillary 186 (H) 65 - 99 mg/dL  Glucose, capillary  Result Value Ref Range   Glucose-Capillary 191 (H) 65 - 99 mg/dL   Comment 1 Notify RN   HIV antibody (Routine Testing)  Result Value Ref Range   HIV Screen 4th Generation wRfx Non Reactive Non Reactive  CBC  Result Value Ref Range   WBC 8.4 4.0 - 10.5 K/uL   RBC 3.28 (L) 3.87 - 5.11 MIL/uL    Hemoglobin 9.7 (L) 12.0 - 15.0 g/dL   HCT 31.8 (L) 36.0 - 46.0 %   MCV 97.0 78.0 - 100.0 fL   MCH 29.6 26.0 - 34.0 pg   MCHC 30.5 30.0 - 36.0 g/dL   RDW 13.6 11.5 - 15.5 %   Platelets 326 150 - 400 K/uL  Protime-INR  Result Value Ref Range   Prothrombin Time 14.2 11.4 - 15.2 seconds   INR 6.72   Basic metabolic panel  Result Value Ref Range   Sodium 135 135 - 145 mmol/L   Potassium 4.8 3.5 - 5.1 mmol/L   Chloride 105 101 - 111 mmol/L   CO2 19 (L) 22 - 32 mmol/L   Glucose, Bld 143 (H) 65 - 99 mg/dL   BUN 34 (H) 6 - 20 mg/dL  Creatinine, Ser 4.65 (H) 0.44 - 1.00 mg/dL   Calcium 8.4 (L) 8.9 - 10.3 mg/dL   GFR calc non Af Amer 10 (L) >60 mL/min   GFR calc Af Amer 12 (L) >60 mL/min   Anion gap 11 5 - 15  Heparin induced platelet Ab (HIT antibody)  Result Value Ref Range   Heparin Induced Plt Ab 0.162 0.000 - 0.400 OD  Antithrombin III  Result Value Ref Range   AntiThromb III Func 100 75 - 120 %  Protein C activity  Result Value Ref Range   Protein C Activity 143 73 - 180 %  Protein C, total  Result Value Ref Range   Protein C, Total 113 60 - 150 %  Protein S activity  Result Value Ref Range   Protein S Activity 80 63 - 140 %  Protein S, total  Result Value Ref Range   Protein S Ag, Total 114 60 - 150 %  Lupus anticoagulant panel  Result Value Ref Range   PTT Lupus Anticoagulant 27.8 0.0 - 51.9 sec   DRVVT 48.5 (H) 0.0 - 47.0 sec   Lupus Anticoag Interp Comment:   Homocysteine, serum  Result Value Ref Range   Homocysteine 34.2 (H) 0.0 - 15.0 umol/L  Cardiolipin antibodies, IgG, IgM, IgA  Result Value Ref Range   Anticardiolipin IgG <9 0 - 14 GPL U/mL   Anticardiolipin IgM <9 0 - 12 MPL U/mL   Anticardiolipin IgA <9 0 - 11 APL U/mL  Glucose, capillary  Result Value Ref Range   Glucose-Capillary 160 (H) 65 - 99 mg/dL  Hemoglobin A1c  Result Value Ref Range   Hgb A1c MFr Bld 6.6 (H) 4.8 - 5.6 %   Mean Plasma Glucose 143 mg/dL  Glucose, capillary  Result Value  Ref Range   Glucose-Capillary 130 (H) 65 - 99 mg/dL   Comment 1 Notify RN    Comment 2 Document in Chart   Glucose, capillary  Result Value Ref Range   Glucose-Capillary 151 (H) 65 - 99 mg/dL  Glucose, capillary  Result Value Ref Range   Glucose-Capillary 164 (H) 65 - 99 mg/dL   Comment 1 Notify RN   Glucose, capillary  Result Value Ref Range   Glucose-Capillary 166 (H) 65 - 99 mg/dL   Comment 1 Notify RN   Glucose, capillary  Result Value Ref Range   Glucose-Capillary 191 (H) 65 - 99 mg/dL  Glucose, capillary  Result Value Ref Range   Glucose-Capillary 188 (H) 65 - 99 mg/dL  Glucose, capillary  Result Value Ref Range   Glucose-Capillary 189 (H) 65 - 99 mg/dL   Comment 1 Notify RN   Glucose, capillary  Result Value Ref Range   Glucose-Capillary 163 (H) 65 - 99 mg/dL   Comment 1 Notify RN   CBC with Differential/Platelet  Result Value Ref Range   WBC 7.1 4.0 - 10.5 K/uL   RBC 2.94 (L) 3.87 - 5.11 MIL/uL   Hemoglobin 8.8 (L) 12.0 - 15.0 g/dL   HCT 29.0 (L) 36.0 - 46.0 %   MCV 98.6 78.0 - 100.0 fL   MCH 29.9 26.0 - 34.0 pg   MCHC 30.3 30.0 - 36.0 g/dL   RDW 14.0 11.5 - 15.5 %   Platelets 325 150 - 400 K/uL   Neutrophils Relative % 63 %   Neutro Abs 4.5 1.7 - 7.7 K/uL   Lymphocytes Relative 26 %   Lymphs Abs 1.8 0.7 - 4.0 K/uL   Monocytes Relative  8 %   Monocytes Absolute 0.6 0.1 - 1.0 K/uL   Eosinophils Relative 3 %   Eosinophils Absolute 0.2 0.0 - 0.7 K/uL   Basophils Relative 0 %   Basophils Absolute 0.0 0.0 - 0.1 K/uL  Basic metabolic panel  Result Value Ref Range   Sodium 135 135 - 145 mmol/L   Potassium 5.1 3.5 - 5.1 mmol/L   Chloride 104 101 - 111 mmol/L   CO2 21 (L) 22 - 32 mmol/L   Glucose, Bld 186 (H) 65 - 99 mg/dL   BUN 39 (H) 6 - 20 mg/dL   Creatinine, Ser 6.06 (H) 0.44 - 1.00 mg/dL   Calcium 8.0 (L) 8.9 - 10.3 mg/dL   GFR calc non Af Amer 7 (L) >60 mL/min   GFR calc Af Amer 9 (L) >60 mL/min   Anion gap 10 5 - 15  Glucose, capillary  Result  Value Ref Range   Glucose-Capillary 270 (H) 65 - 99 mg/dL  Glucose, capillary  Result Value Ref Range   Glucose-Capillary 168 (H) 65 - 99 mg/dL  dRVVT Mix  Result Value Ref Range   dRVVT Mix 41.0 0.0 - 47.0 sec  Glucose, capillary  Result Value Ref Range   Glucose-Capillary 160 (H) 65 - 99 mg/dL   Comment 1 Notify RN    Comment 2 Document in Chart   I-STAT 4, (NA,K, GLUC, HGB,HCT)  Result Value Ref Range   Sodium 138 135 - 145 mmol/L   Potassium 4.2 3.5 - 5.1 mmol/L   Glucose, Bld 169 (H) 65 - 99 mg/dL   HCT 33.0 (L) 36.0 - 46.0 %   Hemoglobin 11.2 (L) 12.0 - 15.0 g/dL  ECHOCARDIOGRAM COMPLETE  Result Value Ref Range   Weight 3,820.13 oz   Height 69 in   BP 122/70 mmHg    Assessment and Plan:   ***

## 2018-02-22 ENCOUNTER — Encounter: Payer: Self-pay | Admitting: Family Medicine

## 2018-02-23 ENCOUNTER — Telehealth: Payer: Self-pay | Admitting: Cardiology

## 2018-02-23 LAB — PROTHROMBIN GENE MUTATION

## 2018-02-23 NOTE — Telephone Encounter (Signed)
Spoke w/ pt and requested that she send a manual transmission b/c her home monitor has not updated in at least 14 days.   

## 2018-02-24 LAB — FACTOR 5 LEIDEN

## 2018-02-27 ENCOUNTER — Other Ambulatory Visit (HOSPITAL_COMMUNITY): Payer: Self-pay | Admitting: *Deleted

## 2018-02-27 NOTE — Progress Notes (Addendum)
    Postoperative Access Visit   History of Present Illness   Heather [name] is a 47 y.o. year old female who presents for postoperative follow-up for: R arm access procedures on 02/17/18.  Pt underwent RUA AVG placement which resulted in severe steal.  This required immediate removal of the AVG.  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  The patient's wounds are healing.  The patient notes no steal symptoms.  The patient is able to complete their activities of daily living.  The patient's current symptoms are: residual weakness in R hand.   Physical Examination   Vitals:   03/01/18 1341 03/01/18 1346  BP: (!) 156/89 (!) 153/88  Pulse: 77 77  Resp: 14   Temp: 97.7 F (36.5 C)   TempSrc: Oral   SpO2: 99%   Weight: 232 lb (105.2 kg)   Height: 5\' 9"  (1.753 m)     right arm Incisions are healed, skin feels warm, hand grip is 4/5, sensation in digits is  intact, no palpable thrill, no bruit can be auscultated, palpable radial pulse    Medical Decision Making   Heather [name] is a 47 y.o. year old female who presents s/p severe steal after R UA AVG placement requiring immediate AVG removal, unknown etiology for brachial artery thromboembolism, s/p TE brachial and ulnar artery   Pt will need time to recover from her procedures.  Will refer patient to OT for rehab for the R hand given some residual weakness.  Pt request a milder narcotic as the patient has difficult with thinking while taking Oxycodone.  T#3 1-2 po q4hr prn pain #20, no refill ordered.  Still unclear of etiology of the sub-acute thrombus in the brachial artery, so concern for thromboembolism raised.  Pt also has a recent MRI demonstrating prior R posterior CVA.  Will refer patient to Neurology for work-up of that CVA.  I suspect any possible etiology for that CVA might also be responsible for the R arm  intraoperative findings as 15% of thromboembolic events from the heart go to the right arm.  Will have pt follow up ine one month for re-evaluation.   Adele Barthel, MD, FACS [facility] Office: [phone] Pager: [phone]

## 2018-02-28 ENCOUNTER — Ambulatory Visit (HOSPITAL_COMMUNITY)
Admission: RE | Admit: 2018-02-28 | Discharge: 2018-02-28 | Disposition: A | Discharge status: Home / Self Care | Payer: Managed Care, Other (non HMO) | Source: Ambulatory Visit | Attending: Nephrology | Admitting: Nephrology

## 2018-02-28 VITALS — BP 186/85 | HR 84 | Temp 98.6°F | Resp 18 | Ht 69.0 in | Wt 239.0 lb

## 2018-02-28 DIAGNOSIS — N182 Chronic kidney disease, stage 2 (mild): Secondary | ICD-10-CM

## 2018-02-28 DIAGNOSIS — D631 Anemia in chronic kidney disease: Secondary | ICD-10-CM | POA: Insufficient documentation

## 2018-02-28 DIAGNOSIS — N183 Chronic kidney disease, stage 3 (moderate): Secondary | ICD-10-CM | POA: Insufficient documentation

## 2018-02-28 LAB — CUP PACEART REMOTE DEVICE CHECK
Implantable Pulse Generator Implant Date: 20181210
MDC IDC SESS DTM: 20190208214111

## 2018-02-28 LAB — POCT HEMOGLOBIN-HEMACUE: HEMOGLOBIN: 9.3 g/dL — AB (ref 12.0–15.0)

## 2018-02-28 MED ORDER — EPOETIN ALFA 40000 UNIT/ML IJ SOLN
INTRAMUSCULAR | Status: AC
  Administered 2018-02-28: 40000 [IU] via SUBCUTANEOUS
  Filled 2018-02-28: qty 1

## 2018-02-28 MED ORDER — SODIUM CHLORIDE 0.9 % IV SOLN
510.0000 mg | Freq: Once | INTRAVENOUS | Status: AC
  Administered 2018-02-28: 510 mg via INTRAVENOUS
  Filled 2018-02-28: qty 17

## 2018-02-28 MED ORDER — EPOETIN ALFA 40000 UNIT/ML IJ SOLN
40000.0000 [IU] | INTRAMUSCULAR | Status: DC
  Administered 2018-02-28: 40000 [IU] via SUBCUTANEOUS

## 2018-03-01 ENCOUNTER — Other Ambulatory Visit: Payer: Self-pay

## 2018-03-01 ENCOUNTER — Encounter: Payer: Self-pay | Admitting: Vascular Surgery

## 2018-03-01 ENCOUNTER — Ambulatory Visit (INDEPENDENT_AMBULATORY_CARE_PROVIDER_SITE_OTHER): Payer: Managed Care, Other (non HMO) | Admitting: Vascular Surgery

## 2018-03-01 VITALS — BP 153/88 | HR 77 | Temp 97.7°F | Resp 14 | Ht 69.0 in | Wt 232.0 lb

## 2018-03-01 DIAGNOSIS — T82898D Other specified complication of vascular prosthetic devices, implants and grafts, subsequent encounter: Secondary | ICD-10-CM

## 2018-03-01 MED ORDER — ACETAMINOPHEN-CODEINE #3 300-30 MG PO TABS: 1.0000 | ORAL_TABLET | ORAL | 0 refills | Status: DC | PRN

## 2018-03-08 ENCOUNTER — Ambulatory Visit (INDEPENDENT_AMBULATORY_CARE_PROVIDER_SITE_OTHER): Payer: Managed Care, Other (non HMO) | Admitting: *Deleted

## 2018-03-08 ENCOUNTER — Encounter: Payer: Self-pay | Admitting: Diagnostic Neuroimaging

## 2018-03-08 ENCOUNTER — Institutional Professional Consult (permissible substitution): Payer: Managed Care, Other (non HMO) | Admitting: Diagnostic Neuroimaging

## 2018-03-08 DIAGNOSIS — I639 Cerebral infarction, unspecified: Secondary | ICD-10-CM | POA: Diagnosis not present

## 2018-03-09 NOTE — Progress Notes (Signed)
[facility] Summary Report / Loop Recorder 

## 2018-03-14 ENCOUNTER — Ambulatory Visit (HOSPITAL_COMMUNITY)
Admission: RE | Admit: 2018-03-14 | Discharge: 2018-03-14 | Disposition: A | Discharge status: Home / Self Care | Payer: Managed Care, Other (non HMO) | Source: Ambulatory Visit | Attending: Nephrology | Admitting: Nephrology

## 2018-03-14 VITALS — BP 149/88 | HR 81 | Resp 18

## 2018-03-14 DIAGNOSIS — N183 Chronic kidney disease, stage 3 (moderate): Secondary | ICD-10-CM | POA: Insufficient documentation

## 2018-03-14 DIAGNOSIS — D631 Anemia in chronic kidney disease: Secondary | ICD-10-CM | POA: Diagnosis present

## 2018-03-14 DIAGNOSIS — N182 Chronic kidney disease, stage 2 (mild): Secondary | ICD-10-CM

## 2018-03-14 LAB — IRON AND TIBC
Iron: 35 ug/dL (ref 28–170)
SATURATION RATIOS: 22 % (ref 10.4–31.8)
TIBC: 162 ug/dL — ABNORMAL LOW (ref 250–450)
UIBC: 127 ug/dL

## 2018-03-14 LAB — FERRITIN: Ferritin: 457 ng/mL — ABNORMAL HIGH (ref 11–307)

## 2018-03-14 LAB — POCT HEMOGLOBIN-HEMACUE: HEMOGLOBIN: 9.5 g/dL — AB (ref 12.0–15.0)

## 2018-03-14 MED ORDER — EPOETIN ALFA 40000 UNIT/ML IJ SOLN
40000.0000 [IU] | INTRAMUSCULAR | Status: DC
  Administered 2018-03-14: 40000 [IU] via SUBCUTANEOUS

## 2018-03-14 MED ORDER — EPOETIN ALFA 40000 UNIT/ML IJ SOLN
INTRAMUSCULAR | Status: AC
  Administered 2018-03-14: 14:00:00 40000 [IU] via SUBCUTANEOUS
  Filled 2018-03-14: qty 1

## 2018-03-15 ENCOUNTER — Encounter: Payer: Managed Care, Other (non HMO) | Admitting: Vascular Surgery

## 2018-03-20 ENCOUNTER — Other Ambulatory Visit (HOSPITAL_COMMUNITY): Payer: Self-pay | Admitting: *Deleted

## 2018-03-21 ENCOUNTER — Ambulatory Visit (HOSPITAL_COMMUNITY)
Admission: RE | Admit: 2018-03-21 | Discharge: 2018-03-21 | Disposition: A | Discharge status: Home / Self Care | Payer: Managed Care, Other (non HMO) | Source: Ambulatory Visit | Attending: Nephrology | Admitting: Nephrology

## 2018-03-21 VITALS — BP 194/89 | HR 79 | Temp 98.6°F | Resp 18

## 2018-03-21 DIAGNOSIS — D631 Anemia in chronic kidney disease: Secondary | ICD-10-CM | POA: Diagnosis present

## 2018-03-21 DIAGNOSIS — N189 Chronic kidney disease, unspecified: Secondary | ICD-10-CM | POA: Diagnosis present

## 2018-03-21 DIAGNOSIS — N182 Chronic kidney disease, stage 2 (mild): Secondary | ICD-10-CM

## 2018-03-21 MED ORDER — EPOETIN ALFA 40000 UNIT/ML IJ SOLN: 40000.0000 [IU] | INTRAMUSCULAR | Status: DC

## 2018-03-21 MED ORDER — SODIUM CHLORIDE 0.9 % IV SOLN
510.0000 mg | Freq: Once | INTRAVENOUS | Status: AC
  Administered 2018-03-21: 14:00:00 510 mg via INTRAVENOUS
  Filled 2018-03-21: qty 17

## 2018-03-22 ENCOUNTER — Telehealth: Payer: Self-pay | Admitting: Surgical

## 2018-03-22 ENCOUNTER — Ambulatory Visit (INDEPENDENT_AMBULATORY_CARE_PROVIDER_SITE_OTHER): Payer: Managed Care, Other (non HMO) | Admitting: Family Medicine

## 2018-03-22 ENCOUNTER — Telehealth: Payer: Self-pay

## 2018-03-22 ENCOUNTER — Encounter: Payer: Self-pay | Admitting: Family Medicine

## 2018-03-22 VITALS — BP 150/100 | HR 88 | Temp 97.9°F | Ht 69.0 in | Wt 234.8 lb

## 2018-03-22 DIAGNOSIS — E119 Type 2 diabetes mellitus without complications: Secondary | ICD-10-CM | POA: Diagnosis not present

## 2018-03-22 DIAGNOSIS — Z794 Long term (current) use of insulin: Secondary | ICD-10-CM | POA: Diagnosis not present

## 2018-03-22 DIAGNOSIS — E1143 Type 2 diabetes mellitus with diabetic autonomic (poly)neuropathy: Secondary | ICD-10-CM

## 2018-03-22 DIAGNOSIS — N185 Chronic kidney disease, stage 5: Secondary | ICD-10-CM

## 2018-03-22 DIAGNOSIS — K3184 Gastroparesis: Secondary | ICD-10-CM

## 2018-03-22 DIAGNOSIS — R1013 Epigastric pain: Secondary | ICD-10-CM

## 2018-03-22 LAB — COMPREHENSIVE METABOLIC PANEL
ALT: 13 U/L (ref 0–35)
AST: 12 U/L (ref 0–37)
Albumin: 2.7 g/dL — ABNORMAL LOW (ref 3.5–5.2)
Alkaline Phosphatase: 61 U/L (ref 39–117)
BUN: 26 mg/dL — ABNORMAL HIGH (ref 6–23)
CO2: 23 mEq/L (ref 19–32)
Calcium: 8.1 mg/dL — ABNORMAL LOW (ref 8.4–10.5)
Chloride: 106 mEq/L (ref 96–112)
Creatinine, Ser: 4.63 mg/dL (ref 0.40–1.20)
GFR: 12.97 mL/min — CL (ref >60.00)
Glucose, Bld: 195 mg/dL — ABNORMAL HIGH (ref 70–99)
Potassium: 4.3 mEq/L (ref 3.5–5.1)
Sodium: 136 mEq/L (ref 135–145)
Total Bilirubin: 0.3 mg/dL (ref 0.2–1.2)
Total Protein: 6.2 g/dL (ref 6.0–8.3)

## 2018-03-22 LAB — CBC WITH DIFFERENTIAL/PLATELET
Basophils Absolute: 0 10*3/uL (ref 0.0–0.1)
Basophils Relative: 0.4 % (ref 0.0–3.0)
Eosinophils Absolute: 0.1 10*3/uL (ref 0.0–0.7)
Eosinophils Relative: 1.1 % (ref 0.0–5.0)
HCT: 34.9 % — ABNORMAL LOW (ref 36.0–46.0)
Hemoglobin: 10.8 g/dL — ABNORMAL LOW (ref 12.0–15.0)
Lymphocytes Relative: 22.9 % (ref 12.0–46.0)
Lymphs Abs: 1.5 10*3/uL (ref 0.7–4.0)
MCHC: 31.1 g/dL (ref 30.0–36.0)
MCV: 93.8 fl (ref 78.0–100.0)
Monocytes Absolute: 0.5 10*3/uL (ref 0.1–1.0)
Monocytes Relative: 7.7 % (ref 3.0–12.0)
Neutro Abs: 4.5 10*3/uL (ref 1.4–7.7)
Neutrophils Relative %: 67.9 % (ref 43.0–77.0)
Platelets: 339 10*3/uL (ref 150.0–400.0)
RBC: 3.72 Mil/uL — ABNORMAL LOW (ref 3.87–5.11)
RDW: 16 % — ABNORMAL HIGH (ref 11.5–15.5)
WBC: 6.7 10*3/uL (ref 4.0–10.5)

## 2018-03-22 LAB — MAGNESIUM: Magnesium: 1.6 mg/dL (ref 1.5–2.5)

## 2018-03-22 LAB — LIPASE: Lipase: 14 U/L (ref 11.0–59.0)

## 2018-03-22 MED ORDER — GI COCKTAIL ~~LOC~~: 30.0000 mL | Freq: Once | ORAL | Status: DC

## 2018-03-22 NOTE — Telephone Encounter (Signed)
FYI

## 2018-03-22 NOTE — Telephone Encounter (Signed)
Patient came in for appointment.  

## 2018-03-22 NOTE — Progress Notes (Signed)
Heather [name] is a 47 y.o. female here for an acute visit.  History of Present Illness:   Shaune Pascal CMA acting as scribe for Dr. Juleen China.  HPI: See Assessment and Plan section for Problem Based Charting of issues discussed today.   PMHx, SurgHx, SocialHx, Medications, and Allergies were reviewed in the Visit Navigator and updated as appropriate.  Review of Systems  Constitutional: Negative for chills and fever.  HENT: Negative for congestion, hearing loss and sinus pain.   Eyes: Negative for blurred vision and double vision.  Respiratory: Negative for cough and shortness of breath.   Cardiovascular: Negative for chest pain and palpitations.  Gastrointestinal: Positive for abdominal pain and nausea.  Genitourinary: Negative for dysuria and hematuria.  Musculoskeletal: Negative for back pain and neck pain.  Neurological: Negative for dizziness and headaches.  Psychiatric/Behavioral: Negative for depression and suicidal ideas.   Current Medications:   .  amLODipine (NORVASC) 10 MG tablet, Take 10 mg by mouth at bedtime., Disp: , Rfl:  .  aspirin EC 81 MG tablet, Take 1 tablet (81 mg total) by mouth daily., Disp: 90 tablet, Rfl: 0 .  calcitRIOL (ROCALTROL) 0.25 MCG capsule, Take 0.75 mcg by mouth daily. , Disp: , Rfl:  .  carvedilol (COREG) 12.5 MG tablet, Take 12.5 mg by mouth daily., Disp: , Rfl:  .  clopidogrel (PLAVIX) 75 MG tablet, Take 1 tablet (75 mg total) by mouth daily., Disp: 90 tablet, Rfl: 3 .  epoetin alfa (EPOGEN,PROCRIT) 11941 UNIT/ML injection, Inject 30,000 Units into the vein every 14 (fourteen) days. , Disp: , Rfl:  .  furosemide (LASIX) 80 MG tablet, Take 160 mg by mouth daily., Disp: , Rfl:  .  gabapentin (NEURONTIN) 300 MG capsule, Take 1 capsule (300 mg total) by mouth 2 (two) times daily., Disp: 180 capsule, Rfl: 1 .  insulin aspart (NOVOLOG) 100 UNIT/ML injection, Inject 6-8 Units into the skin 3 (three) times daily before meals. 8 UNITS WITH BREAKFAST,  6 UNITS WITH LUNCH, & 8 UNITS WITH SUPPER, Disp: , Rfl:  .  insulin degludec (TRESIBA FLEXTOUCH) 100 UNIT/ML SOPN FlexTouch Pen, Inject 28 Units into the skin daily with lunch. , Disp: , Rfl:  .  simvastatin (ZOCOR) 10 MG tablet, Take 0.5 tablets (5 mg total) by mouth at bedtime. 1/2 po q hs - GFR 14 (Patient taking differently: Take 10 mg by mouth at bedtime. ), Disp: 45 tablet, Rfl: 1   Allergies  Allergen Reactions  . Ibuprofen Other (See Comments)    CKD stage 3. Should avoid.  . Dilaudid [Hydromorphone Hcl] Itching and Other (See Comments)    Can take with Benadryl.   Review of Systems:   Pertinent items are noted in the HPI. Otherwise, ROS is negative.  Vitals:   Vitals:   03/22/18 1327  BP: (!) 150/100  Pulse: 88  Temp: 97.9 F (36.6 C)  TempSrc: Oral  SpO2: 97%  Weight: 234 lb 12.8 oz (106.5 kg)  Height: 5\' 9"  (1.753 m)     Body mass index is 34.67 kg/m.  Physical Exam:   Physical Exam  Constitutional: [name] oriented to person, place, and time. She appears well-developed and well-nourished. No distress.  HENT:  Head: Normocephalic and atraumatic.  Right Ear: External ear normal.  Left Ear: External ear normal.  Nose: Nose normal.  Mouth/Throat: Oropharynx is clear and moist.  Eyes: Pupils are equal, round, and reactive to light. Conjunctivae and EOM are normal.  Neck: Normal range of  motion. Neck supple. No thyromegaly present.  Cardiovascular: Normal rate, regular rhythm and intact distal pulses.  Pulmonary/Chest: Effort normal.  Abdominal: Soft. Bowel sounds are normal. There is tenderness in the epigastric area. There is no rigidity, no rebound and no guarding.  Musculoskeletal: Normal range of motion.  Lymphadenopathy:    She has no cervical adenopathy.  Neurological: [name] alert and oriented to person, place, and time.  Skin: Skin is warm and dry. Capillary refill takes less than 2 seconds.  Psychiatric: She has a normal mood and affect. Her behavior  is normal.  Nursing note and vitals reviewed.  Results for orders placed or performed in visit on 03/22/18  CBC with Differential/Platelet  Result Value Ref Range   WBC 6.7 4.0 - 10.5 K/uL   RBC 3.72 (L) 3.87 - 5.11 Mil/uL   Hemoglobin 10.8 (L) 12.0 - 15.0 g/dL   HCT 34.9 (L) 36.0 - 46.0 %   MCV 93.8 78.0 - 100.0 fl   MCHC 31.1 30.0 - 36.0 g/dL   RDW 16.0 (H) 11.5 - 15.5 %   Platelets 339.0 150.0 - 400.0 K/uL   Neutrophils Relative % 67.9 43.0 - 77.0 %   Lymphocytes Relative 22.9 12.0 - 46.0 %   Monocytes Relative 7.7 3.0 - 12.0 %   Eosinophils Relative 1.1 0.0 - 5.0 %   Basophils Relative 0.4 0.0 - 3.0 %   Neutro Abs 4.5 1.4 - 7.7 K/uL   Lymphs Abs 1.5 0.7 - 4.0 K/uL   Monocytes Absolute 0.5 0.1 - 1.0 K/uL   Eosinophils Absolute 0.1 0.0 - 0.7 K/uL   Basophils Absolute 0.0 0.0 - 0.1 K/uL  Comprehensive metabolic panel  Result Value Ref Range   Sodium 136 135 - 145 mEq/L   Potassium 4.3 3.5 - 5.1 mEq/L   Chloride 106 96 - 112 mEq/L   CO2 23 19 - 32 mEq/L   Glucose, Bld 195 (H) 70 - 99 mg/dL   BUN 26 (H) 6 - 23 mg/dL   Creatinine, Ser 4.63 (HH) 0.40 - 1.20 mg/dL   Total Bilirubin 0.3 0.2 - 1.2 mg/dL   Alkaline Phosphatase 61 39 - 117 U/L   AST 12 0 - 37 U/L   ALT 13 0 - 35 U/L   Total Protein 6.2 6.0 - 8.3 g/dL   Albumin 2.7 (L) 3.5 - 5.2 g/dL   Calcium 8.1 (L) 8.4 - 10.5 mg/dL   GFR 12.97 (LL) >60.00 mL/min  Lipase  Result Value Ref Range   Lipase 14.0 11.0 - 59.0 U/L  Magnesium  Result Value Ref Range   Magnesium 1.6 1.5 - 2.5 mg/dL    Assessment and Plan:   Abdominal Pain: Patient has been having abdominal pain for over a month. She has not been eating much and when she drinks water feels bloated. She has had nausea and two times of vomiting. She has been having some back pain with this as well. Denies bloody vomiting or stools. She has seen GI in the past. She has taken pepto that makes her vomit. Patient stated that she has lost 10 pounds. We will order CBC, CMP,  Lipase, and magnesium today.   Balance: Sometimes she feels off balance when walking. She has told hold on to her husband sometimes.   1. Diabetes mellitus, type II, insulin dependent ([place]) Taking medication compliantly without noted sided effects [x]   YES  []   NO  Episodes of hypoglycemia? []   YES  [x]   NO Maintaining a diabetic  diet? []   YES  [x]   NO Trying to exercise on a regular basis? []   YES  [x]   NO  Lab Results  Component Value Date   HGBA1C 6.6 (H) 02/18/2018    Lab Results  Component Value Date   MICROALBUR 66.1 (H) 04/07/2015    Lab Results  Component Value Date   CHOL 249 (H) 12/03/2017   HDL 48 12/03/2017   LDLCALC 169 (H) 12/03/2017   LDLDIRECT 120.0 04/17/2015   TRIG 158 (H) 12/03/2017   CHOLHDL 5.2 12/03/2017     Wt Readings from Last 3 Encounters:  03/22/18 234 lb 12.8 oz (106.5 kg)  03/01/18 232 lb (105.2 kg)  02/28/18 239 lb (108.4 kg)   BP Readings from Last 3 Encounters:  03/22/18 (!) 150/100  03/21/18 (!) 194/89  03/14/18 (!) 149/88   Lab Results  Component Value Date   CREATININE 4.63 ([place]) 03/22/2018   2. Epigastric pain - Ambulatory referral to Gastroenterology - CBC with Differential/Platelet - Comprehensive metabolic panel - Lipase - Magnesium  3. Gastroparesis due to DM [facility]) - Ambulatory referral to Gastroenterology - CBC with Differential/Platelet - Comprehensive metabolic panel - Lipase - Magnesium  The patient had a recent CVA.  [name] on Plavix and aspirin.  She was supposed already follow-up with neurology but keeps that appointment.  I have encouraged her to go ahead and make that.  I did let her know that I do not expect her to continue on Plavix.  . Reviewed expectations re: course of current medical issues. . Discussed self-management of symptoms. . Outlined signs and symptoms indicating need for more acute intervention. . Patient verbalized understanding and all questions were answered. [name] Health Maintenance issues  including appropriate healthy diet, exercise, and smoking avoidance were discussed with patient. . See orders for this visit as documented in the electronic medical record. . Patient received an After Visit Summary.  CMA served as Education administrator during this visit. History, Physical, and Plan performed by medical provider. The above documentation has been reviewed and is accurate and complete. Briscoe Deutscher, D.O.   Briscoe Deutscher, DO [place], [facility] 03/24/2018

## 2018-03-22 NOTE — Telephone Encounter (Signed)
Left message for patient to return call to office for more information about her symptoms. [name] being seen today for stomach issues.

## 2018-03-22 NOTE — Telephone Encounter (Signed)
[facility] lab called with critical results on patient. Creatinine 4.63 and GFR 12.97.

## 2018-03-27 ENCOUNTER — Encounter (HOSPITAL_COMMUNITY): Payer: Managed Care, Other (non HMO)

## 2018-03-28 ENCOUNTER — Ambulatory Visit (HOSPITAL_COMMUNITY)
Admission: RE | Admit: 2018-03-28 | Discharge: 2018-03-28 | Disposition: A | Discharge status: Home / Self Care | Payer: Managed Care, Other (non HMO) | Source: Ambulatory Visit | Attending: Nephrology | Admitting: Nephrology

## 2018-03-28 VITALS — BP 160/79 | HR 79 | Resp 16

## 2018-03-28 DIAGNOSIS — N183 Chronic kidney disease, stage 3 (moderate): Secondary | ICD-10-CM | POA: Diagnosis present

## 2018-03-28 DIAGNOSIS — D631 Anemia in chronic kidney disease: Secondary | ICD-10-CM | POA: Insufficient documentation

## 2018-03-28 DIAGNOSIS — N182 Chronic kidney disease, stage 2 (mild): Secondary | ICD-10-CM

## 2018-03-28 LAB — POCT HEMOGLOBIN-HEMACUE: Hemoglobin: 11 g/dL — ABNORMAL LOW (ref 12.0–15.0)

## 2018-03-28 MED ORDER — EPOETIN ALFA 40000 UNIT/ML IJ SOLN
INTRAMUSCULAR | Status: AC
  Administered 2018-03-28: 40000 [IU] via SUBCUTANEOUS
  Filled 2018-03-28: qty 1

## 2018-03-28 MED ORDER — EPOETIN ALFA 40000 UNIT/ML IJ SOLN
40000.0000 [IU] | INTRAMUSCULAR | Status: DC
  Administered 2018-03-28: 40000 [IU] via SUBCUTANEOUS

## 2018-03-31 ENCOUNTER — Encounter: Payer: Self-pay | Admitting: Rehabilitation

## 2018-03-31 DIAGNOSIS — I69354 Hemiplegia and hemiparesis following cerebral infarction affecting left non-dominant side: Secondary | ICD-10-CM

## 2018-03-31 NOTE — Therapy (Signed)
[facility] [address] [place], [place], [address] Phone: [phone]   Fax:  [phone]  Patient Details  Name: Heather [name] MRN: [id number] Date of Birth: 12-Mar-1971 Referring Provider:  No ref. provider found  Encounter Date: 03/31/2018   PHYSICAL THERAPY DISCHARGE SUMMARY  Visits from Start of Care: 5  Current functional level related to goals / functional outcomes: Unsure as she did not return to follow up visits.    Remaining deficits: PT Long Term Goals - 12/30/17 1957      PT LONG TERM GOAL #1   Title  Pt will be independent with final HEP in order to indicate improved functional mobilty and decreased fall risk.  (Target Date: 02/13/17)    Time  6    Period  Weeks    Status  New    Target Date  02/13/18      PT LONG TERM GOAL #2   Title  Pt will perform 5TSS in </= 20 secs without UE support in order to indicate decreased fall risk and improved functional strength.      Time  3    Period  Weeks    Status  New      PT LONG TERM GOAL #3   Title  Pt will improve gait speed to >/= 2.62 ft/sec w/ LRAD in order to indicate pt safe community ambulator.      Time  3    Period  Weeks    Status  New      PT LONG TERM GOAL #4   Title  Pt will score >19/24 on DGI in order to indicate decreased fall risk.      Time  6    Period  Weeks      PT LONG TERM GOAL #5   Title  Pt will improve 6MWT 150' from baseline with LRAD in order to indicate improved functional endurance.      Time  6    Period  Weeks    Status  New         Education / Equipment: HEP  Plan: Patient agrees to discharge.  Patient goals were not met. Patient is being discharged due to not returning since the last visit.  ?????     Cameron Sprang, PT, MPT [facility] [address] [place], [place], [address] Phone: [phone]   Fax:  [phone] 03/31/18, 9:27 AM

## 2018-04-03 ENCOUNTER — Ambulatory Visit: Payer: Managed Care, Other (non HMO) | Admitting: Family Medicine

## 2018-04-04 NOTE — Progress Notes (Signed)
    Postoperative Access Visit   History of Present Illness   Heather [name] is a 47 y.o. year old female who presents for postoperative follow-up for: right UA AVG placement complicated by severe steal and brachial artery thromboembolism (Date: 02/17/18).  Patient was sent to OT for rehab and also referred to Neurology for evaluation of her CVA.  The patient's current symptoms are: none.  R hand pain resolved.  R hand grip better.   Physical Examination   Vitals:   04/05/18 1356  BP: (!) 226/121  Pulse: 78  Resp: 16  Temp: (!) 97 F (36.1 C)  TempSrc: Oral  SpO2: 100%  Weight: 237 lb (107.5 kg)  Height: 5\' 9"  (1.753 m)    right arm Hand grip 4-5/5, sensation intact, palpable radial pulse, incisions are healed    Medical Decision Making   Heather [name] is a 47 y.o. year old female who presents s/p RUA AVG complicated by severe steal and brachial artery thrombus leading to exc. RUA AVG, VPA R brachial artery, TE R brachial, radial and ulnar artery, poorly controlled diabetic, severe HTN   OT c/s for rehab R hand  Given severity of steal in this patient, I am reluctant to go into the arm again.  ABI and aortoiliac duplex in 2 months to see if patient a candidate for thigh AVG.  Thank you for allowing [name] to participate in this patient's care.   Adele Barthel, MD, FACS [facility] Office: [phone] Pager: [phone]

## 2018-04-05 ENCOUNTER — Other Ambulatory Visit: Payer: Self-pay

## 2018-04-05 ENCOUNTER — Ambulatory Visit (INDEPENDENT_AMBULATORY_CARE_PROVIDER_SITE_OTHER): Payer: Managed Care, Other (non HMO) | Admitting: Vascular Surgery

## 2018-04-05 ENCOUNTER — Encounter: Payer: Self-pay | Admitting: Vascular Surgery

## 2018-04-05 VITALS — BP 226/121 | HR 78 | Temp 97.0°F | Resp 16 | Ht 69.0 in | Wt 237.0 lb

## 2018-04-05 DIAGNOSIS — N185 Chronic kidney disease, stage 5: Secondary | ICD-10-CM

## 2018-04-05 DIAGNOSIS — T82898D Other specified complication of vascular prosthetic devices, implants and grafts, subsequent encounter: Secondary | ICD-10-CM

## 2018-04-05 DIAGNOSIS — I749 Embolism and thrombosis of unspecified artery: Secondary | ICD-10-CM

## 2018-04-05 HISTORY — DX: Embolism and thrombosis of unspecified artery: I74.9

## 2018-04-10 ENCOUNTER — Ambulatory Visit (INDEPENDENT_AMBULATORY_CARE_PROVIDER_SITE_OTHER): Payer: Managed Care, Other (non HMO) | Admitting: Gastroenterology

## 2018-04-10 ENCOUNTER — Encounter: Payer: Self-pay | Admitting: Gastroenterology

## 2018-04-10 ENCOUNTER — Ambulatory Visit (INDEPENDENT_AMBULATORY_CARE_PROVIDER_SITE_OTHER): Payer: Managed Care, Other (non HMO) | Admitting: *Deleted

## 2018-04-10 ENCOUNTER — Other Ambulatory Visit: Payer: Self-pay

## 2018-04-10 VITALS — BP 190/98 | HR 77 | Ht 68.25 in | Wt 243.0 lb

## 2018-04-10 DIAGNOSIS — Z1211 Encounter for screening for malignant neoplasm of colon: Secondary | ICD-10-CM

## 2018-04-10 DIAGNOSIS — Z794 Long term (current) use of insulin: Secondary | ICD-10-CM

## 2018-04-10 DIAGNOSIS — E119 Type 2 diabetes mellitus without complications: Secondary | ICD-10-CM

## 2018-04-10 DIAGNOSIS — E1143 Type 2 diabetes mellitus with diabetic autonomic (poly)neuropathy: Secondary | ICD-10-CM

## 2018-04-10 DIAGNOSIS — K3184 Gastroparesis: Secondary | ICD-10-CM | POA: Diagnosis not present

## 2018-04-10 DIAGNOSIS — Z7902 Long term (current) use of antithrombotics/antiplatelets: Secondary | ICD-10-CM | POA: Diagnosis not present

## 2018-04-10 DIAGNOSIS — I739 Peripheral vascular disease, unspecified: Secondary | ICD-10-CM

## 2018-04-10 DIAGNOSIS — R1013 Epigastric pain: Secondary | ICD-10-CM | POA: Diagnosis not present

## 2018-04-10 DIAGNOSIS — R600 Localized edema: Secondary | ICD-10-CM

## 2018-04-10 DIAGNOSIS — I639 Cerebral infarction, unspecified: Secondary | ICD-10-CM | POA: Diagnosis not present

## 2018-04-10 DIAGNOSIS — K5900 Constipation, unspecified: Secondary | ICD-10-CM

## 2018-04-10 DIAGNOSIS — IMO0001 Reserved for inherently not codable concepts without codable children: Secondary | ICD-10-CM

## 2018-04-10 MED ORDER — PANTOPRAZOLE SODIUM 40 MG PO TBEC: 40.0000 mg | DELAYED_RELEASE_TABLET | Freq: Every day | ORAL | 5 refills | Status: DC

## 2018-04-10 NOTE — Progress Notes (Signed)
04/10/2018 Heather [name] [id number] Jul 25, 1971   HISTORY OF PRESENT ILLNESS:  This is a 47 year old female who is known to Dr. Havery Moros for an appt regarding constipation in 2016.  Has chronic idiopathic constipation, may be worsened by medication.  Anyway, she comes here today primarily with complaints of epigastric abdominal pain, nausea, and early satiety.  She says that she's had burning in her epigastrium for a couple of months, it is constant.  Complains of a lot of nausea and upper abdominal bloating, but vomits only on rare occasions.  Says that the pain is worse after eating and drinking and she does not eat much.  She had an EGD with Dr. Olevia Perches in 02/2011 that was normal at that time.  GES in 11/2016 showed markedly delayed gastric emptying with only 11% emptied at 4 hours.  In regards to her constipation, she says that she has one or two BM's per week.  Not currently using anything to help her move her bowels.  Does not see blood in her stool.  Has never had colonoscopy in the past, this was scheduled when she saw Dr. Havery Moros in 2016 but she had to cancel and did not call back to reschedule.  Previously tried Miralax twice daily, senna, etc.  It looks like Dr. Havery Moros asked her to try Linzess, but she does not recall taking that.  Other chronic medical problems include CKD stage 5 not on dialysis, anemia of chronic disease, history of CVA on Plavix, and IDDM that is not well controlled.   Past Medical History:  Diagnosis Date  . Allergy   . Anemia of chronic disease   . B12 deficiency 05/10/2014  . Blood transfusion without reported diagnosis   . Bronchitis   . Chronic constipation   . Chronic kidney disease   . Constipation   . CVA (cerebral vascular accident) ([place]) 12/03/2017   nonhemorrhagic infarct-- inferior right cerebellum  . Diabetic neuropathy, painful ([place]) 07/13/2013   Stop lyrica due to cost & SE of drowsy. Gabapentin helps Stay off hydrocodone: pt  states she thinks "she was addicted" to med. "I don't want that anymore."   . DM2 (diabetes mellitus, type 2) ([place])   . Dyspnea    with exertion  . Fibromyalgia   . Gastroparesis due to DM   . GERD (gastroesophageal reflux disease)   . Headache   . Heart murmur, systolic 32/01/253   Grade 1/6 ejection murmur per [facility] notes.   . Hyperlipemia   . Hypertension   . IBS (irritable bowel syndrome)   . Infertility   . Leiomyoma of uterus   . Morbid obesity due to excess calories ([place]) 11/16/2016  . Neuropathy   . Obesity   . Pancreatitis   . Pneumonia   . 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 Right-sided low back pain without sciatica 12/08/2015  . Secondary hyperthyroidism 11/27/2013   Followed by [facility], notes reviewed 01/01/15. PTH had improved with oral vitamin D: 133 to 85. Last 155-probably not taking vit D.    [name] Upper airway cough syndrome 11/15/2016   rec off acei/ on pepcid hs 11/15/2016 >>>    Past Surgical History:  Procedure Laterality Date  . ARTERY REPAIR Right 02/17/2018   Procedure: EXPLORATION OF RIGHT BRACHIAL ARTERY;  Surgeon: Conrad Dawson, MD;  Location: [place];  Service: Vascular;  Laterality: Right;  . ARTERY REPAIR Right 02/17/2018   Procedure: BRACHIAL ARTERY EXPLORATION AND TRHOMBECTOMY;  Surgeon: Conrad [place], MD;  Location: [facility] OR;  Service: Vascular;  Laterality: Right;  . AV FISTULA PLACEMENT Left 05/09/2017   Procedure: INSERTION OF ARTERIOVENOUS (AV) GRAFT ARM (ARTEGRAFT);  Surgeon: Conrad [place], MD;  Location[facility] OR;  Service: Vascular;  Laterality: Left;  . AV FISTULA PLACEMENT Right 02/17/2018   Procedure: INSERTION OF ARTERIOVENOUS (AV) GORE-TEX GRAFT ARM RIGHT UPPER ARM;  Surgeon: Conrad [place], MD;  Location: [place];  Service: Vascular;  Laterality: Right;  . BASCILIC VEIN TRANSPOSITION Left 08/31/2016   Procedure: BASILIC VEIN TRANSPOSITION FIRST STAGE;  Surgeon: Conrad [place], MD;  Location: [place];  Service: Vascular;   Laterality: Left;  . FOOT SURGERY Right    t"ook bone out- maybe hammer toe"  . LIGATION OF ARTERIOVENOUS  FISTULA Left 05/09/2017   Procedure: LIGATION OF ARTERIOVENOUS  FISTULA;  Surgeon: Conrad [place], MD;  Location: [place];  Service: Vascular;  Laterality: Left;  . LOOP RECORDER INSERTION N/A 12/05/2017   Procedure: LOOP RECORDER INSERTION;  Surgeon: Evans Lance, MD;  Location: [place] [facility];  Service: Cardiovascular;  Laterality: N/A;  . MYOMECTOMY    . REMOVAL OF GRAFT Right 02/17/2018   Procedure: REMOVAL OF RIGHT UPPER ARM ARTERIOVENOUS GRAFT;  Surgeon: Conrad Funk, MD;  Location: [place];  Service: Vascular;  Laterality: Right;  . TEE WITHOUT CARDIOVERSION N/A 12/05/2017   Procedure: TRANSESOPHAGEAL ECHOCARDIOGRAM (TEE);  Surgeon: Sanda Klein, MD;  Location[facility] ENDOSCOPY;  Service: Cardiovascular;  Laterality: N/A;  . VITRECTOMY Bilateral     reports that she has never smoked. She has never used smokeless tobacco. She reports that she does not drink alcohol or use drugs. family history includes Colon polyps in her mother; Diabetes in her brother, mother, sister, and sister; Heart murmur in her father and son; Hypertension in her father; Kidney failure in her sister; Retinal degeneration in her brother; Stroke in her paternal grandmother. Allergies  Allergen Reactions  . Ibuprofen Other (See Comments)    CKD stage 3. Should avoid.  . Dilaudid [Hydromorphone Hcl] Itching and Other (See Comments)    Can take with Benadryl.      Outpatient Encounter Medications as of 04/10/2018  Medication Sig  . acetaminophen-codeine (TYLENOL #3) 300-30 MG tablet Take 1-2 tablets by mouth every 4 (four) hours as needed for moderate pain.  [name] albuterol (PROVENTIL HFA;VENTOLIN HFA) 108 (90 Base) MCG/ACT inhaler Inhale into the lungs.  [name] amLODipine (NORVASC) 10 MG tablet Take 10 mg by mouth at bedtime.  [name] aspirin EC 81 MG tablet Take 1 tablet (81 mg total) by mouth daily.  . calcitRIOL  (ROCALTROL) 0.25 MCG capsule Take 0.75 mcg by mouth daily.   . carvedilol (COREG) 12.5 MG tablet Take 12.5 mg by mouth daily.  . clopidogrel (PLAVIX) 75 MG tablet Take 1 tablet (75 mg total) by mouth daily.  [name] epoetin alfa (EPOGEN,PROCRIT) 03500 UNIT/ML injection Inject 30,000 Units into the vein every 14 (fourteen) days.   . furosemide (LASIX) 80 MG tablet Take 160 mg by mouth daily.  [name] gabapentin (NEURONTIN) 300 MG capsule Take 1 capsule (300 mg total) by mouth 2 (two) times daily. (Patient taking differently: Take 300 mg by mouth 2 (two) times daily as needed. )  . insulin aspart (NOVOLOG FLEXPEN) 100 UNIT/ML FlexPen Novolog Flexpen U-100 Insulin aspart 100 unit/mL (3 mL) subcutaneous  INJECT 15UNITS BEFORE BREAKFAST ,20 UNITS BEFORE LUNCH AND 20 UNITS BEFORE SUPPER  . insulin aspart (NOVOLOG) 100 UNIT/ML injection  Inject 6-8 Units into the skin 3 (three) times daily before meals. 8 UNITS WITH BREAKFAST, 6 UNITS WITH LUNCH, & 8 UNITS WITH SUPPER  . insulin degludec (TRESIBA FLEXTOUCH) 100 UNIT/ML SOPN FlexTouch Pen Inject 28 Units into the skin daily with lunch.   . simvastatin (ZOCOR) 10 MG tablet Take 0.5 tablets (5 mg total) by mouth at bedtime. 1/2 po q hs - GFR 14 (Patient taking differently: Take 10 mg by mouth at bedtime. )  . [DISCONTINUED] oxyCODONE-acetaminophen (PERCOCET/ROXICET) 5-325 MG tablet Take 1 tablet by mouth every 6 (six) hours as needed.   No facility-administered encounter medications on file as of 04/10/2018.      REVIEW OF SYSTEMS  : All other systems reviewed and negative except where noted in the History of Present Illness.   PHYSICAL EXAM: BP (!) 190/98   Pulse 77   Ht 5' 8.25" (1.734 m)   Wt 243 lb (110.2 kg)   LMP 03/16/2018 (Approximate)   BMI 36.68 kg/m  General: Well developed black female in no acute distress Head: Normocephalic and atraumatic Eyes:  Sclerae anicteric, conjunctiva pink. Ears: Normal auditory acuity Lungs: Clear throughout to  auscultation; no increased WOB. Heart: Regular rate and rhythm; no M/R/G. Abdomen: Soft, non-distended.  BS present.  Non-tender. Rectal:  Will be done at the time of colonoscopy. Musculoskeletal: Symmetrical with no gross deformities  Skin: No lesions on visible extremities Extremities: No edema  Neurological: Alert oriented x 4, grossly non-focal Psychological:  Alert and cooperative. Normal mood and affect  ASSESSMENT AND PLAN: *Epigastric pain, nausea, early satiety:  Likely her symptoms are from her known gastroparesis from long-standing uncontrolled DM.  Will start pantoprazole 40 mg daily.  Will schedule for EGD with Dr. Havery Moros to rule out ulcer, etc since the epigastric pain/burning is new.  Discussed gastroparesis diet and literature was given. *Screening colonoscopy:  Will schedule with Dr. Havery Moros.  Will do a 2 day bowel prep due to her issues with chronic constipation. *Chronic constipation:  Will try amitiza 24 mcg daily, samples, coupon, and prescription were given.  I asked that she call us back with an update on how the medication was working. *Antiplatelet use with Plavix for history of CVA:  Hold Plavix for 5 days before procedure - will instruct when and how to resume after procedure. Risks and benefits of procedure including bleeding, perforation, infection, missed lesions, medication reactions and possible hospitalization or surgery if complications occur explained. Additional rare but real risk of cardiovascular event such as heart attack or ischemia/infarct of other organs off of Plavix explained and need to seek urgent help if this occurs. Will communicate by phone or EMR with patient's prescribing provider that to confirm holding Plavix is reasonable in this case.  Ok to take/continue ASA while holding Plavix if needed. *IDDM:  Insulin will be adjusted prior to endoscopic procedure per protocol. Will resume normal dosing after procedure. *CKD stage 5, not on  dialysis *Mild anemia of chronic disease secondary to renal disease   CC:  Briscoe Deutscher, DO

## 2018-04-10 NOTE — Patient Instructions (Addendum)
We have given you samples of amitiza 68mcg.   Prescription sent to pharmacy. Savings card given. Call us back to let us know if it is helping you.   Gastroparesis diet handout provided.  Stage 3.  Your diabetic instructions for procedures.  X   ORAL DIABETIC MEDICATION INSTRUCTIONS  The day before your procedure:  Take your diabetic pill as you do normally  The day of your procedure:  Do not take your diabetic pill   We will check your blood sugar levels during the admission process and again in Recovery before discharging you home  ______________________________________________________________________  X  INSULIN (LONG ACTING) MEDICATION INSTRUCTIONS (Lantus, NPH, 70/30, Humulin, Novolin-N, Levemir, Toujeo )  Tresiba injection  The day before your procedure:  Take  your regular evening dose    The day of your procedure:  Do not take your morning dose   X   INSULIN (SHORT ACTING) MEDICATION INSTRUCTIONS (Regular, Humulog, Novolog, Apidra, Novolin, Humulin)   The day before your procedure:  Do not take your evening dose   The day of your procedure:  Do not take your morning dose  ______________________________________________________________________

## 2018-04-11 ENCOUNTER — Telehealth: Payer: Self-pay

## 2018-04-11 ENCOUNTER — Encounter (HOSPITAL_COMMUNITY): Payer: Managed Care, Other (non HMO)

## 2018-04-11 ENCOUNTER — Ambulatory Visit (INDEPENDENT_AMBULATORY_CARE_PROVIDER_SITE_OTHER): Payer: Managed Care, Other (non HMO) | Admitting: Family Medicine

## 2018-04-11 ENCOUNTER — Encounter: Payer: Self-pay | Admitting: Family Medicine

## 2018-04-11 VITALS — BP 150/96 | HR 80 | Temp 98.6°F | Ht 68.25 in | Wt 242.0 lb

## 2018-04-11 DIAGNOSIS — D631 Anemia in chronic kidney disease: Secondary | ICD-10-CM

## 2018-04-11 DIAGNOSIS — I639 Cerebral infarction, unspecified: Secondary | ICD-10-CM | POA: Diagnosis not present

## 2018-04-11 DIAGNOSIS — R5381 Other malaise: Secondary | ICD-10-CM | POA: Diagnosis not present

## 2018-04-11 DIAGNOSIS — K5904 Chronic idiopathic constipation: Secondary | ICD-10-CM

## 2018-04-11 DIAGNOSIS — E1140 Type 2 diabetes mellitus with diabetic neuropathy, unspecified: Secondary | ICD-10-CM

## 2018-04-11 DIAGNOSIS — R600 Localized edema: Secondary | ICD-10-CM | POA: Diagnosis not present

## 2018-04-11 DIAGNOSIS — E1129 Type 2 diabetes mellitus with other diabetic kidney complication: Secondary | ICD-10-CM | POA: Diagnosis not present

## 2018-04-11 DIAGNOSIS — R5383 Other fatigue: Secondary | ICD-10-CM

## 2018-04-11 DIAGNOSIS — I10 Essential (primary) hypertension: Secondary | ICD-10-CM

## 2018-04-11 DIAGNOSIS — N185 Chronic kidney disease, stage 5: Secondary | ICD-10-CM

## 2018-04-11 DIAGNOSIS — IMO0002 Reserved for concepts with insufficient information to code with codable children: Secondary | ICD-10-CM

## 2018-04-11 DIAGNOSIS — E1165 Type 2 diabetes mellitus with hyperglycemia: Secondary | ICD-10-CM

## 2018-04-11 DIAGNOSIS — K3184 Gastroparesis: Secondary | ICD-10-CM

## 2018-04-11 DIAGNOSIS — N921 Excessive and frequent menstruation with irregular cycle: Secondary | ICD-10-CM

## 2018-04-11 LAB — CBC WITH DIFFERENTIAL/PLATELET
Basophils Absolute: 0 10*3/uL (ref 0.0–0.1)
Basophils Relative: 0.1 % (ref 0.0–3.0)
Eosinophils Absolute: 0.1 10*3/uL (ref 0.0–0.7)
Eosinophils Relative: 1.9 % (ref 0.0–5.0)
HCT: 35.5 % — ABNORMAL LOW (ref 36.0–46.0)
Hemoglobin: 11 g/dL — ABNORMAL LOW (ref 12.0–15.0)
Lymphocytes Relative: 22.3 % (ref 12.0–46.0)
Lymphs Abs: 1.4 10*3/uL (ref 0.7–4.0)
MCHC: 31 g/dL (ref 30.0–36.0)
MCV: 94.5 fl (ref 78.0–100.0)
Monocytes Absolute: 0.5 10*3/uL (ref 0.1–1.0)
Monocytes Relative: 7.5 % (ref 3.0–12.0)
Neutro Abs: 4.2 10*3/uL (ref 1.4–7.7)
Neutrophils Relative %: 68.2 % (ref 43.0–77.0)
Platelets: 237 10*3/uL (ref 150.0–400.0)
RBC: 3.76 Mil/uL — ABNORMAL LOW (ref 3.87–5.11)
RDW: 16.3 % — ABNORMAL HIGH (ref 11.5–15.5)
WBC: 6.2 10*3/uL (ref 4.0–10.5)

## 2018-04-11 LAB — COMPREHENSIVE METABOLIC PANEL
ALT: 12 U/L (ref 0–35)
AST: 11 U/L (ref 0–37)
Albumin: 2.8 g/dL — ABNORMAL LOW (ref 3.5–5.2)
Alkaline Phosphatase: 70 U/L (ref 39–117)
BUN: 28 mg/dL — ABNORMAL HIGH (ref 6–23)
CO2: 22 mEq/L (ref 19–32)
Calcium: 8 mg/dL — ABNORMAL LOW (ref 8.4–10.5)
Chloride: 109 mEq/L (ref 96–112)
Creatinine, Ser: 4.32 mg/dL — ABNORMAL HIGH (ref 0.40–1.20)
GFR: 14.04 mL/min — CL (ref >60.00)
Glucose, Bld: 158 mg/dL — ABNORMAL HIGH (ref 70–99)
Potassium: 4.3 mEq/L (ref 3.5–5.1)
Sodium: 135 mEq/L (ref 135–145)
Total Bilirubin: 0.3 mg/dL (ref 0.2–1.2)
Total Protein: 6.2 g/dL (ref 6.0–8.3)

## 2018-04-11 LAB — MICROALBUMIN / CREATININE URINE RATIO
Creatinine,U: 95.1 mg/dL
Microalb Creat Ratio: 104.6 mg/g — ABNORMAL HIGH (ref 0.0–30.0)
Microalb, Ur: 99.5 mg/dL — ABNORMAL HIGH (ref 0.0–1.9)

## 2018-04-11 LAB — PHOSPHORUS: Phosphorus: 4.2 mg/dL (ref 2.3–4.6)

## 2018-04-11 LAB — BRAIN NATRIURETIC PEPTIDE: Pro B Natriuretic peptide (BNP): 65 pg/mL (ref 0.0–100.0)

## 2018-04-11 LAB — MAGNESIUM: Magnesium: 1.7 mg/dL (ref 1.5–2.5)

## 2018-04-11 MED ORDER — GABAPENTIN 300 MG PO CAPS: 300.0000 mg | ORAL_CAPSULE | Freq: Two times a day (BID) | ORAL | 1 refills | Status: DC

## 2018-04-11 MED ORDER — SIMVASTATIN 10 MG PO TABS: 5.0000 mg | ORAL_TABLET | Freq: Every day | ORAL | 1 refills | Status: DC

## 2018-04-11 MED ORDER — CLOPIDOGREL BISULFATE 75 MG PO TABS: 75.0000 mg | ORAL_TABLET | Freq: Every day | ORAL | 3 refills | Status: DC

## 2018-04-11 NOTE — Progress Notes (Signed)
[facility] Summary Report / Loop Recorder 

## 2018-04-11 NOTE — Telephone Encounter (Signed)
At baseline 

## 2018-04-11 NOTE — Progress Notes (Signed)
Heather [name] is a 47 y.o. female is here for follow up.  History of Present Illness:   HPI: See Assessment and Plan section for Problem Based Charting of issues discussed today.   Health Maintenance Due  Topic Date Due  . OPHTHALMOLOGY EXAM  08/05/2017   Depression screen [facility] 2/9 01/02/2018 12/08/2017 06/30/2017  Decreased Interest 1 1 0  Down, Depressed, Hopeless 0 3 0  PHQ - 2 Score 1 4 0  Altered sleeping 3 3 -  Tired, decreased energy 3 2 -  Change in appetite 1 3 -  Feeling bad or failure about yourself  0 2 -  Trouble concentrating 0 1 -  Moving slowly or fidgety/restless 1 1 -  Suicidal thoughts 0 1 -  PHQ-9 Score 9 17 -  Some recent data might be hidden   PMHx, SurgHx, SocialHx, FamHx, Medications, and Allergies were reviewed in the Visit Navigator and updated as appropriate.   Patient Active Problem List   Diagnosis Date Noted  . Thromboembolism ([place]) 04/05/2018  . Steal syndrome dialysis vascular access ([place]) 02/17/2018  . Cyst of left ovary 01/12/2018  . Dysmenorrhea 01/12/2018  . Insomnia 01/12/2018  . Menorrhagia 01/12/2018  . 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 Vitamin D deficiency 01/02/2018  . Bilateral lower extremity edema 01/02/2018  . Reactive depression 12/10/2017  . Left-sided weakness 12/10/2017  . Stroke (cerebrum) ([place]) 12/02/2017  . Dysfunction of left eustachian tube 11/24/2017  . Arthralgia of right temporomandibular joint 10/24/2017  . Morbid obesity due to excess calories ([place]) 11/16/2016  . Upper airway cough syndrome 11/15/2016  . DM (diabetes mellitus), type 2, uncontrolled, with renal complications ([place]) 48/54/6270  . Dyslipidemia associated with type 2 diabetes mellitus ([place]) 06/25/2016  . Uncontrolled type 2 diabetes mellitus with both eyes affected by proliferative retinopathy and macular edema, with long-term current use of insulin ([place]) 06/22/2016  . Uncontrolled type 2 diabetes mellitus with diabetic polyneuropathy,  with long-term current use of insulin ([place]) 03/08/2016  . Uncontrolled type 2 diabetes mellitus with stage 4 chronic kidney disease, with long-term current use of insulin ([place]) 03/08/2016  . Right-sided low back pain without sciatica 12/08/2015  . Proliferative diabetic retinopathy ([place]) 09/29/2015  . Pseudophakia of both eyes 05/30/2015  . 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 B12 deficiency 05/10/2014  . Anemia in chronic kidney disease 04/26/2014  . CKD (chronic kidney disease) stage 5, GFR less than 15 ml/min (H[facility]) 04/26/2014  . Heart murmur, systolic 35/00/9381  . Secondary hyperparathyroidism ([place]) 11/27/2013  . Posterior subcapsular cataract, bilateral 10/18/2013  . Diabetic neuropathy, painful ([place]) 07/13/2013  . Diabetes mellitus, type II, insulin dependent ([place]) 02/19/2011  . Essential hypertension 02/19/2011  . Gastroparesis 02/19/2011   Social History   Tobacco Use  . Smoking status: Never Smoker  . Smokeless tobacco: Never Used  Substance Use Topics  . Alcohol use: No    Alcohol/week: 0.0 oz  . Drug use: No   Current Medications and Allergies:   Current Outpatient Medications:  .  albuterol (PROVENTIL HFA;VENTOLIN HFA) 108 (90 Base) MCG/ACT inhaler, Inhale into the lungs., Disp: , Rfl:  .  amLODipine (NORVASC) 10 MG tablet, Take 10 mg by mouth at bedtime., Disp: , Rfl:  .  calcitRIOL (ROCALTROL) 0.25 MCG capsule, Take 0.75 mcg by mouth daily. , Disp: , Rfl:  .  carvedilol (COREG) 12.5 MG tablet, Take 12.5 mg by mouth daily., Disp: , Rfl:  .  clopidogrel (PLAVIX) 75 MG tablet, Take  1 tablet (75 mg total) by mouth daily., Disp: 90 tablet, Rfl: 3 .  epoetin alfa (EPOGEN,PROCRIT) 32355 UNIT/ML injection, Inject 30,000 Units into the vein every 14 (fourteen) days. , Disp: , Rfl:  .  furosemide (LASIX) 80 MG tablet, Take 160 mg by mouth daily., Disp: , Rfl:  .  gabapentin (NEURONTIN) 300 MG capsule, Take 1 capsule (300 mg total) by mouth 2 (two) times daily.,  Disp: 180 capsule, Rfl: 1 .  insulin aspart (NOVOLOG) 100 UNIT/ML injection, Inject 6-8 Units into the skin 3 (three) times daily before meals. 8 UNITS WITH BREAKFAST, 6 UNITS WITH LUNCH, & 8 UNITS WITH SUPPER, Disp: , Rfl:  .  insulin degludec (TRESIBA FLEXTOUCH) 100 UNIT/ML SOPN FlexTouch Pen, Inject 28 Units into the skin daily with lunch. , Disp: , Rfl:  .  pantoprazole (PROTONIX) 40 MG tablet, Take 1 tablet (40 mg total) by mouth daily., Disp: 30 tablet, Rfl: 5 .  simvastatin (ZOCOR) 10 MG tablet, Take 0.5 tablets (5 mg total) by mouth at bedtime. 1/2 po q hs - GFR 14, Disp: 45 tablet, Rfl: 1 .  Continuous Blood Gluc Receiver (FREESTYLE LIBRE 46 DAY READER) DEVI, See admin instructions., Disp: , Rfl: 0 .  FLOVENT HFA 220 MCG/ACT inhaler, INHALE 2 PUFFS INTO THE LUNGS DAILY., Disp: , Rfl: 12   Allergies  Allergen Reactions  . Ibuprofen Other (See Comments)    CKD stage 3. Should avoid.  . Dilaudid [Hydromorphone Hcl] Itching and Other (See Comments)    Can take with Benadryl.   Review of Systems   Pertinent items are noted in the HPI. Otherwise, ROS is negative.  Vitals:   Vitals:   04/11/18 1346  BP: (!) 150/96  Pulse: 80  Temp: 98.6 F (37 C)  TempSrc: Oral  SpO2: 94%  Weight: 242 lb (109.8 kg)  Height: 5' 8.25" (1.734 m)     Body mass index is 36.53 kg/m.   Physical Exam:   Physical Exam  Constitutional: [name] oriented to person, place, and time. She appears well-developed and well-nourished.  HENT:  Head: Normocephalic and atraumatic.  Eyes: Pupils are equal, round, and reactive to light. EOM are normal.  Neck: Normal range of motion. Neck supple.  Cardiovascular: Normal rate, regular rhythm and intact distal pulses.  Pulmonary/Chest: Effort normal.  Abdominal: Soft.  Neurological: [name] alert and oriented to person, place, and time.  Skin: Skin is warm.  Psychiatric: She has a normal mood and affect. Her behavior is normal.  Nursing note and vitals  reviewed.   Results for orders placed or performed in visit on 04/11/18  Microalbumin / creatinine urine ratio  Result Value Ref Range   Microalb, Ur 99.5 (H) 0.0 - 1.9 mg/dL   Creatinine,U 95.1 mg/dL   Microalb Creat Ratio 104.6 (H) 0.0 - 30.0 mg/g  CBC with Differential/Platelet  Result Value Ref Range   WBC 6.2 4.0 - 10.5 K/uL   RBC 3.76 (L) 3.87 - 5.11 Mil/uL   Hemoglobin 11.0 (L) 12.0 - 15.0 g/dL   HCT 35.5 (L) 36.0 - 46.0 %   MCV 94.5 78.0 - 100.0 fl   MCHC 31.0 30.0 - 36.0 g/dL   RDW 16.3 (H) 11.5 - 15.5 %   Platelets 237.0 150.0 - 400.0 K/uL   Neutrophils Relative % 68.2 43.0 - 77.0 %   Lymphocytes Relative 22.3 12.0 - 46.0 %   Monocytes Relative 7.5 3.0 - 12.0 %   Eosinophils Relative 1.9 0.0 - 5.0 %  Basophils Relative 0.1 0.0 - 3.0 %   Neutro Abs 4.2 1.4 - 7.7 K/uL   Lymphs Abs 1.4 0.7 - 4.0 K/uL   Monocytes Absolute 0.5 0.1 - 1.0 K/uL   Eosinophils Absolute 0.1 0.0 - 0.7 K/uL   Basophils Absolute 0.0 0.0 - 0.1 K/uL  Comprehensive metabolic panel  Result Value Ref Range   Sodium 135 135 - 145 mEq/L   Potassium 4.3 3.5 - 5.1 mEq/L   Chloride 109 96 - 112 mEq/L   CO2 22 19 - 32 mEq/L   Glucose, Bld 158 (H) 70 - 99 mg/dL   BUN 28 (H) 6 - 23 mg/dL   Creatinine, Ser 4.32 (H) 0.40 - 1.20 mg/dL   Total Bilirubin 0.3 0.2 - 1.2 mg/dL   Alkaline Phosphatase 70 39 - 117 U/L   AST 11 0 - 37 U/L   ALT 12 0 - 35 U/L   Total Protein 6.2 6.0 - 8.3 g/dL   Albumin 2.8 (L) 3.5 - 5.2 g/dL   Calcium 8.0 (L) 8.4 - 10.5 mg/dL   GFR 14.04 (LL) >60.00 mL/min  Magnesium  Result Value Ref Range   Magnesium 1.7 1.5 - 2.5 mg/dL  Phosphorus  Result Value Ref Range   Phosphorus 4.2 2.3 - 4.6 mg/dL  Brain natriuretic peptide  Result Value Ref Range   Pro B Natriuretic peptide (BNP) 65.0 0.0 - 100.0 pg/mL   Assessment and Plan:   Rayme was seen today for follow-up.  Diagnoses and all orders for this visit:  Bilateral lower extremity edema Comments: On Lasix 160 mg po BID.  Not using compression hose.   Diabetic neuropathy, painful ([facility]CC) -     gabapentin (NEURONTIN) 300 MG capsule; Take 1 capsule (300 mg total) by mouth 2 (two) times daily. -     Microalbumin / creatinine urine ratio  Cerebrovascular accident (CVA), unspecified mechanism ([place]) Comments: Stop ASA. Continue Plavix per Neurology. Orders: -     clopidogrel (PLAVIX) 75 MG tablet; Take 1 tablet (75 mg total) by mouth daily.  DM (diabetes mellitus), type 2, uncontrolled, with renal complications ([place]) Comments: Dental visit requiring a note. See communications.  Orders: -     CBC with Differential/Platelet -     Comprehensive metabolic panel -     Magnesium -     Phosphorus -     Brain natriuretic peptide  Anemia in stage 5 chronic kidney disease, not on chronic dialysis (HCC)  CKD (chronic kidney disease) stage 5, GFR less than 15 ml/min (HCC)  Essential hypertension Comments: Slightly elevated today. Did not take Norvasc dose last night.   Gastroparesis Comments: Started Protonix.   Malaise and fatigue Comments: Worsening. Consider sleep study if labs stable.  Chronic idiopathic constipation Comments: Recently started Amitiza and feels that it is helpful.   Menorrhagia with irregular cycle Comments: Heavy menses.  Edema of both lower extremities  Other orders -     simvastatin (ZOCOR) 10 MG tablet; Take 0.5 tablets (5 mg total) by mouth at bedtime. 1/2 po q hs - GFR 14    . Reviewed expectations re: course of current medical issues. . Discussed self-management of symptoms. . Outlined signs and symptoms indicating need for more acute intervention. . Patient verbalized understanding and all questions were answered. [name] Health Maintenance issues including appropriate healthy diet, exercise, and smoking avoidance were discussed with patient. . See orders for this visit as documented in the electronic medical record. . Patient received an After Visit Summary.  Briscoe Deutscher, DO , [place] 04/17/2018  Future Appointments  Date Time Provider [place]  04/26/2018  2:30 PM [facility] INJECTION ROOM [facility] None  05/15/2018  2:55 PM [facility] DEVICE REMOTES [facility]  05/19/2018  2:30 PM Armbruster, Carlota Raspberry, MD [facility]  06/14/2018  9:00 AM [facility] HS VASC 1 - HC [facility] VVS  06/14/2018 10:00 AM [facility] HS VASC 1 - HC [facility] VVS  06/14/2018 11:00 AM Conrad [place], MD [facility] VVS  07/11/2018  1:40 PM Briscoe Deutscher, DO [facility] P[facility]

## 2018-04-11 NOTE — Telephone Encounter (Signed)
[facility] lab called with critical result on patient. GFR 14.04.

## 2018-04-11 NOTE — Telephone Encounter (Signed)
Please advise 

## 2018-04-12 ENCOUNTER — Ambulatory Visit: Payer: Managed Care, Other (non HMO) | Admitting: Vascular Surgery

## 2018-04-12 ENCOUNTER — Ambulatory Visit (HOSPITAL_COMMUNITY)
Admission: RE | Admit: 2018-04-12 | Discharge: 2018-04-12 | Disposition: A | Discharge status: Home / Self Care | Payer: Managed Care, Other (non HMO) | Source: Ambulatory Visit | Attending: Nephrology | Admitting: Nephrology

## 2018-04-12 VITALS — BP 140/79 | HR 80 | Temp 97.8°F | Resp 18

## 2018-04-12 DIAGNOSIS — N182 Chronic kidney disease, stage 2 (mild): Secondary | ICD-10-CM | POA: Diagnosis present

## 2018-04-12 LAB — IRON AND TIBC
IRON: 75 ug/dL (ref 28–170)
SATURATION RATIOS: 37 % — AB (ref 10.4–31.8)
TIBC: 202 ug/dL — ABNORMAL LOW (ref 250–450)
UIBC: 127 ug/dL

## 2018-04-12 LAB — FERRITIN: Ferritin: 358 ng/mL — ABNORMAL HIGH (ref 11–307)

## 2018-04-12 LAB — POCT HEMOGLOBIN-HEMACUE: Hemoglobin: 11.2 g/dL — ABNORMAL LOW (ref 12.0–15.0)

## 2018-04-12 MED ORDER — EPOETIN ALFA 40000 UNIT/ML IJ SOLN
INTRAMUSCULAR | Status: AC
  Filled 2018-04-12: qty 1

## 2018-04-12 MED ORDER — EPOETIN ALFA 40000 UNIT/ML IJ SOLN
40000.0000 [IU] | INTRAMUSCULAR | Status: DC
  Administered 2018-04-12: 14:00:00 40000 [IU] via SUBCUTANEOUS

## 2018-04-17 ENCOUNTER — Encounter: Payer: Self-pay | Admitting: Family Medicine

## 2018-04-17 LAB — CUP PACEART REMOTE DEVICE CHECK
Date Time Interrogation Session: 20190313213909
Implantable Pulse Generator Implant Date: 20181210

## 2018-04-26 ENCOUNTER — Telehealth: Payer: Self-pay | Admitting: *Deleted

## 2018-04-26 ENCOUNTER — Encounter: Payer: Self-pay | Admitting: Gastroenterology

## 2018-04-26 ENCOUNTER — Ambulatory Visit (HOSPITAL_COMMUNITY)
Admission: RE | Admit: 2018-04-26 | Discharge: 2018-04-26 | Disposition: A | Discharge status: Home / Self Care | Payer: Managed Care, Other (non HMO) | Source: Ambulatory Visit | Attending: Nephrology | Admitting: Nephrology

## 2018-04-26 VITALS — BP 175/86 | HR 80 | Temp 98.3°F | Resp 18

## 2018-04-26 DIAGNOSIS — N183 Chronic kidney disease, stage 3 (moderate): Secondary | ICD-10-CM | POA: Insufficient documentation

## 2018-04-26 DIAGNOSIS — D631 Anemia in chronic kidney disease: Secondary | ICD-10-CM | POA: Insufficient documentation

## 2018-04-26 DIAGNOSIS — N182 Chronic kidney disease, stage 2 (mild): Secondary | ICD-10-CM

## 2018-04-26 MED ORDER — EPOETIN ALFA 40000 UNIT/ML IJ SOLN
INTRAMUSCULAR | Status: AC
  Administered 2018-04-26: 40000 [IU]
  Filled 2018-04-26: qty 1

## 2018-04-26 MED ORDER — EPOETIN ALFA 40000 UNIT/ML IJ SOLN: 40000.0000 [IU] | INTRAMUSCULAR | Status: DC

## 2018-04-26 NOTE — Telephone Encounter (Signed)
  04/26/2018   RE: Heather [name] DOB: 08-12-1971 MRN: [id number]   Dear Glyn Ade DO,    We have scheduled the above patient for an endoscopic procedure. Our records show that she is on anticoagulation therapy.   Please advise as to how long the patient may come off her therapy of Plavix prior to the procedure, which is scheduled for 05-19-2018.  Please  route the Plavix clearance directions to [facility] CMA .   Sincerely,    Alonza Bogus PA-C

## 2018-04-27 LAB — POCT HEMOGLOBIN-HEMACUE: Hemoglobin: 10.1 g/dL — ABNORMAL LOW (ref 12.0–15.0)

## 2018-04-27 NOTE — Progress Notes (Signed)
Agree with assessment and plan as outlined.  

## 2018-05-01 NOTE — Telephone Encounter (Signed)
Called Heather [name] and advised that per Heather Deutscher DO, she can hold Heather Plavix starting on 5-19 through 5-24 ( day of colonoscopy)  I advised Dr. Havery Moros will let her know about resuming Heather Plavix. Heather [name] verbalized understanding Heather instructions.

## 2018-05-01 NOTE — Telephone Encounter (Signed)
Stop Plavix 5 days prior and restart 24 hours after.

## 2018-05-02 NOTE — Telephone Encounter (Signed)
See phone note from 05-01-2018.

## 2018-05-09 LAB — HEPATIC FUNCTION PANEL
ALT: 15 (ref 7–35)
AST: 14 (ref 13–35)
Alkaline Phosphatase: 84 (ref 25–125)
BILIRUBIN, TOTAL: 0.2

## 2018-05-09 LAB — BASIC METABOLIC PANEL
BUN: 36 — AB (ref 4–21)
Creatinine: 5.2 — AB (ref 0.5–1.1)
GLUCOSE: 110
Potassium: 4.4 (ref 3.4–5.3)
SODIUM: 137 (ref 137–147)

## 2018-05-10 ENCOUNTER — Encounter (HOSPITAL_COMMUNITY)
Admission: RE | Admit: 2018-05-10 | Discharge: 2018-05-10 | Disposition: A | Discharge status: Home / Self Care | Payer: Managed Care, Other (non HMO) | Source: Ambulatory Visit | Attending: Nephrology | Admitting: Nephrology

## 2018-05-10 VITALS — BP 172/86 | HR 87 | Temp 98.1°F | Resp 18

## 2018-05-10 DIAGNOSIS — D631 Anemia in chronic kidney disease: Secondary | ICD-10-CM | POA: Diagnosis not present

## 2018-05-10 DIAGNOSIS — N182 Chronic kidney disease, stage 2 (mild): Secondary | ICD-10-CM

## 2018-05-10 DIAGNOSIS — N183 Chronic kidney disease, stage 3 (moderate): Secondary | ICD-10-CM | POA: Insufficient documentation

## 2018-05-10 LAB — IRON AND TIBC
Iron: 43 ug/dL (ref 28–170)
Saturation Ratios: 22 % (ref 10.4–31.8)
TIBC: 195 ug/dL — ABNORMAL LOW (ref 250–450)
UIBC: 152 ug/dL

## 2018-05-10 LAB — POCT HEMOGLOBIN-HEMACUE: Hemoglobin: 9.8 g/dL — ABNORMAL LOW (ref 12.0–15.0)

## 2018-05-10 LAB — FERRITIN: FERRITIN: 364 ng/mL — AB (ref 11–307)

## 2018-05-10 MED ORDER — EPOETIN ALFA 40000 UNIT/ML IJ SOLN
40000.0000 [IU] | INTRAMUSCULAR | Status: DC
  Administered 2018-05-10: 40000 [IU] via SUBCUTANEOUS

## 2018-05-10 MED ORDER — EPOETIN ALFA 40000 UNIT/ML IJ SOLN
INTRAMUSCULAR | Status: AC
  Administered 2018-05-10: 40000 [IU] via SUBCUTANEOUS
  Filled 2018-05-10: qty 1

## 2018-05-15 ENCOUNTER — Ambulatory Visit (INDEPENDENT_AMBULATORY_CARE_PROVIDER_SITE_OTHER): Payer: Managed Care, Other (non HMO) | Admitting: *Deleted

## 2018-05-15 DIAGNOSIS — I639 Cerebral infarction, unspecified: Secondary | ICD-10-CM

## 2018-05-15 LAB — CUP PACEART REMOTE DEVICE CHECK
Date Time Interrogation Session: 20190415220518
Implantable Pulse Generator Implant Date: 20181210

## 2018-05-15 NOTE — Progress Notes (Signed)
[facility] Summary Report / Loop Recorder 

## 2018-05-19 ENCOUNTER — Encounter: Payer: Self-pay | Admitting: Gastroenterology

## 2018-05-19 ENCOUNTER — Ambulatory Visit (AMBULATORY_SURGERY_CENTER): Payer: Managed Care, Other (non HMO) | Admitting: Gastroenterology

## 2018-05-19 ENCOUNTER — Other Ambulatory Visit: Payer: Self-pay

## 2018-05-19 VITALS — BP 160/94 | HR 84 | Temp 97.8°F | Resp 18 | Ht 68.25 in | Wt 243.0 lb

## 2018-05-19 DIAGNOSIS — G8929 Other chronic pain: Secondary | ICD-10-CM

## 2018-05-19 DIAGNOSIS — K2970 Gastritis, unspecified, without bleeding: Secondary | ICD-10-CM | POA: Diagnosis not present

## 2018-05-19 DIAGNOSIS — K317 Polyp of stomach and duodenum: Secondary | ICD-10-CM

## 2018-05-19 DIAGNOSIS — Z1211 Encounter for screening for malignant neoplasm of colon: Secondary | ICD-10-CM | POA: Diagnosis present

## 2018-05-19 DIAGNOSIS — D123 Benign neoplasm of transverse colon: Secondary | ICD-10-CM

## 2018-05-19 DIAGNOSIS — D122 Benign neoplasm of ascending colon: Secondary | ICD-10-CM | POA: Diagnosis not present

## 2018-05-19 DIAGNOSIS — R1013 Epigastric pain: Secondary | ICD-10-CM | POA: Diagnosis not present

## 2018-05-19 DIAGNOSIS — B9681 Helicobacter pylori [H. pylori] as the cause of diseases classified elsewhere: Secondary | ICD-10-CM | POA: Diagnosis not present

## 2018-05-19 DIAGNOSIS — K2950 Unspecified chronic gastritis without bleeding: Secondary | ICD-10-CM | POA: Diagnosis not present

## 2018-05-19 MED ORDER — SODIUM CHLORIDE 0.9 % IV SOLN: 500.0000 mL | Freq: Once | INTRAVENOUS | Status: DC

## 2018-05-19 NOTE — Op Note (Signed)
[place] Patient Name: Heather [name] Procedure Date: 05/19/2018 2:30 PM MRN: [id number] Endoscopist: Remo Lipps P. Havery Moros , MD Age: 47 Referring MD:  Date of Birth: 1971-02-08 Gender: Female Account #: [id number] Procedure:                Colonoscopy Indications:              Screening for colorectal malignant neoplasm, This                            is the patient's first colonoscopy Medicines:                Monitored Anesthesia Care Procedure:                Pre-Anesthesia Assessment:                           - Prior to the procedure, a History and Physical                            was performed, and patient medications and                            allergies were reviewed. The patient's tolerance of                            previous anesthesia was also reviewed. The risks                            and benefits of the procedure and the sedation                            options and risks were discussed with the patient.                            All questions were answered, and informed consent                            was obtained. Prior Anticoagulants: The patient has                            taken no previous anticoagulant or antiplatelet                            agents. ASA Grade Assessment: III - A patient with                            severe systemic disease. After reviewing the risks                            and benefits, the patient was deemed in                            satisfactory condition to undergo the procedure.  After obtaining informed consent, the colonoscope                            was passed under direct vision. Throughout the                            procedure, the patient's blood pressure, pulse, and                            oxygen saturations were monitored continuously. The                            Colonoscope was introduced through the anus and                            advanced to  the the cecum, identified by                            appendiceal orifice and ileocecal valve. The                            colonoscopy was performed without difficulty. The                            patient tolerated the procedure well. The quality                            of the bowel preparation was adequate. The                            ileocecal valve, appendiceal orifice, and rectum                            were photographed. Scope In: 2:53:15 PM Scope Out: 3:14:08 PM Scope Withdrawal Time: 0 hours 17 minutes 7 seconds  Total Procedure Duration: 0 hours 20 minutes 53 seconds  Findings:                 The perianal and digital rectal examinations were                            normal.                           A 4 mm polyp was found in the ascending colon. The                            polyp was sessile. The polyp was removed with a                            cold snare. Resection and retrieval were complete.                           One sessile and one flat polyp were found in the  transverse colon. The polyps were 3 to 6 mm in                            size. These polyps were removed with a cold snare.                            Resection and retrieval were complete.                           The prep was fair in the right colon but after                            lavage was performed adequate views were obtained.                            The exam was otherwise without abnormality on                            direct and retroflexion views. Complications:            No immediate complications. Estimated blood loss:                            Minimal. Estimated Blood Loss:     Estimated blood loss was minimal. Impression:               - One 4 mm polyp in the ascending colon, removed                            with a cold snare. Resected and retrieved.                           - Two 3 to 6 mm polyps in the transverse colon,                             removed with a cold snare. Resected and retrieved.                           - The examination was otherwise normal on direct                            and retroflexion views. Recommendation:           - Patient has a contact number available for                            emergencies. The signs and symptoms of potential                            delayed complications were discussed with the                            patient. Return to normal activities tomorrow.  Written discharge instructions were provided to the                            patient.                           - Resume previous diet.                           - Continue present medications.                           - Resume Plavix tomorrow                           - Await pathology results.                           - Repeat colonoscopy for surveillance based on                            pathology results. Remo Lipps P. Aryaa Bunting, MD 05/19/2018 3:19:56 PM This report has been signed electronically.

## 2018-05-19 NOTE — Op Note (Signed)
[place] Patient Name: Heather [name] Procedure Date: 05/19/2018 2:30 PM MRN: [id number] Endoscopist: Remo Lipps P. Havery Moros , MD Age: 47 Referring MD:  Date of Birth: 27-Nov-1971 Gender: Female Account #: [id number] Procedure:                Upper GI endoscopy Indications:              Epigastric abdominal pain, early satiety - history                            of abnormal gastric emptying study consistent with                            gastroparesis, no prior EGD Medicines:                Monitored Anesthesia Care Procedure:                Pre-Anesthesia Assessment:                           - Prior to the procedure, a History and Physical                            was performed, and patient medications and                            allergies were reviewed. The patient's tolerance of                            previous anesthesia was also reviewed. The risks                            and benefits of the procedure and the sedation                            options and risks were discussed with the patient.                            All questions were answered, and informed consent                            was obtained. Prior Anticoagulants: The patient has                            taken no previous anticoagulant or antiplatelet                            agents. ASA Grade Assessment: III - A patient with                            severe systemic disease. After reviewing the risks                            and benefits, the patient was deemed in  satisfactory condition to undergo the procedure.                           After obtaining informed consent, the endoscope was                            passed under direct vision. Throughout the                            procedure, the patient's blood pressure, pulse, and                            oxygen saturations were monitored continuously. The                            Endoscope  was introduced through the mouth, and                            advanced to the second part of duodenum. The upper                            GI endoscopy was accomplished without difficulty.                            The patient tolerated the procedure well. Scope In: Scope Out: Findings:                 Esophagogastric landmarks were identified: the                            Z-line was found at 39 cm, the gastroesophageal                            junction was found at 39 cm and the upper extent of                            the gastric folds was found at 39 cm from the                            incisors.                           The exam of the esophagus was otherwise normal.                           Patchy inflammation characterized by erythema and                            friability was found in the gastric body. Biopsies                            were taken with a cold forceps for Helicobacter  pylori testing.                           A single 4 mm sessile polyp was found in the                            cardia. Biopsies were taken with a cold forceps for                            histology (attempts at complete removal were made                            but the patient had persistent coughing throughout                            the exam making endoscopic positioning challenging                            - 2 biopsies of the lesion were obtained).                           The exam of the stomach was otherwise normal.                           The duodenal bulb and second portion of the                            duodenum were normal. Complications:            Patient had transient oxygen desaturation managed                            by anesthesia. Estimated blood loss: Minimal. Estimated Blood Loss:     Estimated blood loss was minimal. Impression:               - Esophagogastric landmarks identified.                           - Normal  esophagus                           - Gastritis. Biopsied to rule out H pylori.                           - A single benign appearing gastric polyp. Biopsied.                           - Normal duodenal bulb and second portion of the                            duodenum. Recommendation:           - Patient has a contact number available for                            emergencies. The signs  and symptoms of potential                            delayed complications were discussed with the                            patient. Return to normal activities tomorrow.                            Written discharge instructions were provided to the                            patient.                           - Resume previous diet.                           - Continue present medications.                           - Resume Plavix tomorrow                           - Await pathology results.                           - Consideration for another trial of Reglan if                            pathology results negative, if patient has not had                            this recently [place]. Anyelina Claycomb, MD 05/19/2018 3:26:05 PM This report has been signed electronically.

## 2018-05-19 NOTE — Progress Notes (Signed)
A and O x3. Report to RN. Tolerated MAC anesthesia well.Teeth unchanged after procedure.

## 2018-05-19 NOTE — Progress Notes (Signed)
Called to room to assist during endoscopic procedure.  Patient ID and intended procedure confirmed with present staff. Received instructions for my participation in the procedure from the performing physician.  

## 2018-05-19 NOTE — Patient Instructions (Signed)
YOU HAD AN ENDOSCOPIC PROCEDURE TODAY AT [facility]:   Refer to the procedure report that was given to you for any specific questions about what was found during the examination.  If the procedure report does not answer your questions, please call your gastroenterologist to clarify.  If you requested that your care partner not be given the details of your procedure findings, then the procedure report has been included in a sealed envelope for you to review at your convenience later.  YOU SHOULD EXPECT: Some feelings of bloating in the abdomen. Passage of more gas than usual.  Walking can help get rid of the air that was put into your GI tract during the procedure and reduce the bloating. If you had a lower endoscopy (such as a colonoscopy or flexible sigmoidoscopy) you may notice spotting of blood in your stool or on the toilet paper. If you underwent a bowel prep for your procedure, you may not have a normal bowel movement for a few days.  Please Note:  You might notice some irritation and congestion in your nose or some drainage.  This is from the oxygen used during your procedure.  There is no need for concern and it should clear up in a day or so.  SYMPTOMS TO REPORT IMMEDIATELY:   Following lower endoscopy (colonoscopy or flexible sigmoidoscopy):  Excessive amounts of blood in the stool  Significant tenderness or worsening of abdominal pains  Swelling of the abdomen that is new, acute  Fever of 100F or higher   Following upper endoscopy (EGD)  Vomiting of blood or coffee ground material  New chest pain or pain under the shoulder blades  Painful or persistently difficult swallowing  New shortness of breath  Fever of 100F or higher  Black, tarry-looking stools  Resume Plavix tomorrow.     For urgent or emergent issues, a gastroenterologist can be reached at any hour by calling [phone].   DIET:  We do recommend a small meal at first, but then you may  proceed to your regular diet.  Drink plenty of fluids but you should avoid alcoholic beverages for 24 hours.  ACTIVITY:  You should plan to take it easy for the rest of today and you should NOT DRIVE or use heavy machinery until tomorrow (because of the sedation medicines used during the test).    FOLLOW UP: Our staff will call the number listed on your records the next business day following your procedure to check on you and address any questions or concerns that you may have regarding the information given to you following your procedure. If we do not reach you, we will leave a message.  However, if you are feeling well and you are not experiencing any problems, there is no need to return our call.  We will assume that you have returned to your regular daily activities without incident.  If any biopsies were taken you will be contacted by phone or by letter within the next 1-3 weeks.  Please call us at [phone] if you have not heard about the biopsies in 3 weeks.    SIGNATURES/CONFIDENTIALITY: You and/or your care partner have signed paperwork which will be entered into your electronic medical record.  These signatures attest to the fact that that the information above on your After Visit Summary has been reviewed and is understood.  Full responsibility of the confidentiality of this discharge information lies with you and/or your care-partner.

## 2018-05-19 NOTE — Progress Notes (Signed)
Reactive airway  Sat decreades D w upper  .Ambu 100% w increased sao2. Continued w procedure per Dr. Loni Muse

## 2018-05-23 ENCOUNTER — Telehealth: Payer: Self-pay | Admitting: *Deleted

## 2018-05-23 ENCOUNTER — Other Ambulatory Visit (HOSPITAL_COMMUNITY): Payer: Self-pay

## 2018-05-23 ENCOUNTER — Ambulatory Visit (HOSPITAL_COMMUNITY)
Admission: RE | Admit: 2018-05-23 | Discharge: 2018-05-23 | Disposition: A | Discharge status: Home / Self Care | Payer: Managed Care, Other (non HMO) | Source: Ambulatory Visit | Attending: Nephrology | Admitting: Nephrology

## 2018-05-23 VITALS — BP 156/83 | HR 77 | Temp 98.6°F | Resp 18 | Ht 69.0 in | Wt 236.0 lb

## 2018-05-23 DIAGNOSIS — Z5181 Encounter for therapeutic drug level monitoring: Secondary | ICD-10-CM | POA: Diagnosis not present

## 2018-05-23 DIAGNOSIS — D631 Anemia in chronic kidney disease: Secondary | ICD-10-CM | POA: Insufficient documentation

## 2018-05-23 DIAGNOSIS — N183 Chronic kidney disease, stage 3 (moderate): Secondary | ICD-10-CM | POA: Diagnosis present

## 2018-05-23 DIAGNOSIS — Z79899 Other long term (current) drug therapy: Secondary | ICD-10-CM | POA: Diagnosis not present

## 2018-05-23 DIAGNOSIS — N182 Chronic kidney disease, stage 2 (mild): Secondary | ICD-10-CM

## 2018-05-23 LAB — POCT HEMOGLOBIN-HEMACUE: Hemoglobin: 9.9 g/dL — ABNORMAL LOW (ref 12.0–15.0)

## 2018-05-23 MED ORDER — EPOETIN ALFA 40000 UNIT/ML IJ SOLN: 40000.0000 [IU] | INTRAMUSCULAR | Status: DC

## 2018-05-23 MED ORDER — SODIUM CHLORIDE 0.9 % IV SOLN
510.0000 mg | Freq: Once | INTRAVENOUS | Status: AC
  Administered 2018-05-23: 510 mg via INTRAVENOUS
  Filled 2018-05-23: qty 17

## 2018-05-23 MED ORDER — EPOETIN ALFA 40000 UNIT/ML IJ SOLN
INTRAMUSCULAR | Status: AC
  Administered 2018-05-23: 40000 [IU]
  Filled 2018-05-23: qty 1

## 2018-05-23 NOTE — Telephone Encounter (Signed)
No answer, message left for the patient. 

## 2018-05-23 NOTE — Telephone Encounter (Signed)
  Follow up Call-  Call back number 05/19/2018  Post procedure Call Back phone  # [phone]  Permission to leave phone message Yes  Some recent data might be hidden     Patient questions:  Do you have a fever, pain , or abdominal swelling? No. Pain Score  0 *  Have you tolerated food without any problems? Yes.    Have you been able to return to your normal activities? Yes.    Do you have any questions about your discharge instructions: Diet   No. Medications  No. Follow up visit  No.  Do you have questions or concerns about your Care? No.  Actions: * If pain score is 4 or above: No action needed, pain <4.

## 2018-05-24 ENCOUNTER — Ambulatory Visit (HOSPITAL_COMMUNITY): Payer: Managed Care, Other (non HMO)

## 2018-05-30 ENCOUNTER — Other Ambulatory Visit: Payer: Self-pay

## 2018-05-30 ENCOUNTER — Telehealth: Payer: Self-pay

## 2018-05-30 DIAGNOSIS — A048 Other specified bacterial intestinal infections: Secondary | ICD-10-CM

## 2018-05-30 MED ORDER — METRONIDAZOLE 500 MG PO TABS: 500.0000 mg | ORAL_TABLET | Freq: Two times a day (BID) | ORAL | 0 refills | Status: DC

## 2018-05-30 MED ORDER — CLARITHROMYCIN 500 MG PO TABS: 500.0000 mg | ORAL_TABLET | Freq: Two times a day (BID) | ORAL | 0 refills | Status: DC

## 2018-05-30 MED ORDER — AMOXICILLIN 500 MG PO TABS: 1000.0000 mg | ORAL_TABLET | Freq: Two times a day (BID) | ORAL | 0 refills | Status: DC

## 2018-05-30 NOTE — Telephone Encounter (Signed)
Left message for patient to call back for results.  

## 2018-05-30 NOTE — Telephone Encounter (Signed)
Heather [name], I was giving this patient her pathology report and calling in medications for treating her H Pylori. She asked about the Amitiza 24 mcg and said that it was not called into her pharmacy. She did get the samples, which worked really well for her. Is it okay to send in Rx for the Amitiza and if so, what quantity? Thanks.

## 2018-05-30 NOTE — Telephone Encounter (Signed)
Patient returned phone call. °

## 2018-05-31 ENCOUNTER — Other Ambulatory Visit: Payer: Self-pay

## 2018-05-31 MED ORDER — LUBIPROSTONE 24 MCG PO CAPS: 24.0000 ug | ORAL_CAPSULE | Freq: Two times a day (BID) | ORAL | 5 refills | Status: DC

## 2018-05-31 NOTE — Telephone Encounter (Signed)
Rx for Amitiza 24 mcg BID with food, qty #60, refill 5 sent to patient's pharmacy.

## 2018-05-31 NOTE — Telephone Encounter (Signed)
That is great.  Ok to send prescription for 24 mcg BID with food, #60, refill 5.  Thank you!

## 2018-06-04 NOTE — Progress Notes (Signed)
Heather [name] is a 47 y.o. female here for an acute visit.  History of Present Illness:   Heather [name], CMA acting as scribe for Heather [name].   HPI: Patient in office for evaluation. She states that starting Saturday she had increased nasua and vomiting with headache. She has had limited food because she is afraid to eat. [name] drinking her normal amount of water.  Recent diagnosis of H. Pylori and prescribed antibiotics - Flagyl and Biaxin. Not tolerating. No ETOH intake. Stopped regimen yesterday.  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 Followed by Endocrinology. Current A1c 6.0. No current access due to complications. Upcoming appointment with Dr. Bridgett [name] to discuss using lower extremity. Followed closely by [facility] as well.  PMHx, SurgHx, SocialHx, Medications, and Allergies were reviewed in the Visit Navigator and updated as appropriate.  Current Medications:   Current Outpatient Medications:  .  amLODipine (NORVASC) 10 MG tablet, Take 10 mg by mouth at bedtime., Disp: , Rfl:  .  calcitRIOL (ROCALTROL) 0.25 MCG capsule, Take 0.75 mcg by mouth daily. , Disp: , Rfl:  .  carvedilol (COREG) 12.5 MG tablet, Take 12.5 mg by mouth daily., Disp: , Rfl:  .  clarithromycin (BIAXIN) 500 MG tablet, Take 1 tablet (500 mg total) by mouth 2 (two) times daily., Disp: 28 tablet, Rfl: 0 .  Continuous Blood Gluc Receiver (FREESTYLE LIBRE 82 DAY READER) DEVI, See admin instructions., Disp: , Rfl: 0 .  epoetin alfa (EPOGEN,PROCRIT) 16109 UNIT/ML injection, Inject 30,000 Units into the vein every 14 (fourteen) days. , Disp: , Rfl:  .  FLOVENT HFA 220 MCG/ACT inhaler, INHALE 2 PUFFS INTO THE LUNGS DAILY., Disp: , Rfl: 12 .  furosemide (LASIX) 80 MG tablet, Take 160 mg by mouth daily., Disp: , Rfl:  .  gabapentin (NEURONTIN) 300 MG capsule, Take 1 capsule (300 mg total) by mouth 2 (two) times daily., Disp: 180 capsule, Rfl: 1 .  insulin aspart (NOVOLOG) 100 UNIT/ML injection, Inject 6-8 Units into the skin 3 (three)  times daily before meals. 8 UNITS WITH BREAKFAST, 6 UNITS WITH LUNCH, & 8 UNITS WITH SUPPER, Disp: , Rfl:  .  insulin degludec (TRESIBA FLEXTOUCH) 100 UNIT/ML SOPN FlexTouch Pen, Inject 28 Units into the skin daily with lunch. , Disp: , Rfl:  .  metroNIDAZOLE (FLAGYL) 500 MG tablet, Take 1 tablet (500 mg total) by mouth 2 (two) times daily., Disp: 28 tablet, Rfl: 0 .  pantoprazole (PROTONIX) 40 MG tablet, Take 1 tablet (40 mg total) by mouth daily., Disp: 30 tablet, Rfl: 5 .  simvastatin (ZOCOR) 10 MG tablet, Take 0.5 tablets (5 mg total) by mouth at bedtime. 1/2 po q hs - GFR 14, Disp: 45 tablet, Rfl: 1 .  clopidogrel (PLAVIX) 75 MG tablet, Take 1 tablet (75 mg total) by mouth daily. (Patient not taking: Reported on 06/05/2018), Disp: 90 tablet, Rfl: 3 .  lubiprostone (AMITIZA) 24 MCG capsule, Take 1 capsule (24 mcg total) by mouth 2 (two) times daily with a meal. (Patient not taking: Reported on 06/05/2018), Disp: 60 capsule, Rfl: 5  Current Facility-Administered Medications:  .  0.9 %  sodium chloride infusion, 500 mL, Intravenous, Once, Armbruster, Heather Raspberry, MD   Allergies  Allergen Reactions  . Ibuprofen Other (See Comments)    CKD stage 3. Should avoid.  . Dilaudid [Hydromorphone Hcl] Itching and Other (See Comments)    Can take with Benadryl.   Review of Systems:   Pertinent items are noted in the HPI. Otherwise,  ROS is negative.  Vitals:   Vitals:   06/05/18 0822  BP: 136/86  Pulse: 85  Temp: 98 F (36.7 C)  TempSrc: Oral  SpO2: 97%  Weight: 238 lb 3.2 oz (108 kg)  Height: 5' 8.25" (1.734 m)     Body mass index is 35.95 kg/m.  Physical Exam:   Physical Exam  Constitutional: [name] oriented to person, place, and time. She appears well-developed and well-nourished.  HENT:  Head: Normocephalic and atraumatic.  Eyes: Pupils are equal, round, and reactive to light. EOM are normal.  Neck: Normal range of motion. Neck supple.  Cardiovascular: Normal rate, regular rhythm  and intact distal pulses.  Pulmonary/Chest: Effort normal.  Abdominal: Soft.  Neurological: [name] alert and oriented to person, place, and time.  Skin: Skin is warm.  Psychiatric: She has a normal mood and affect. Her behavior is normal.  Nursing note and vitals reviewed.  Assessment and Plan:   Heather [name] was seen today for follow-up.  Diagnoses and all orders for this visit:  DM (diabetes mellitus), type 2, uncontrolled, with renal complications ([place]) Comments: A1c controlled. Followed by Endocrinology. GCM. Orders: -     CBC with Differential/Platelet -     Comprehensive metabolic panel -     Hemoglobin A1c -     Magnesium -     Lipase  Chronic kidney disease, stage V ([facility]) -     CBC with Differential/Platelet -     Comprehensive metabolic panel -     Hemoglobin A1c -     Magnesium -     Lipase  Sleep disorder -     Ambulatory referral to Sleep Studies  Nausea vomiting and diarrhea Comments: After Abx use. Will hold. Labs pending. Will recheck next week.    . Reviewed expectations re: course of current medical issues. . Discussed self-management of symptoms. . Outlined signs and symptoms indicating need for more acute intervention. . Patient verbalized understanding and all questions were answered. [name] Health Maintenance issues including appropriate healthy diet, exercise, and smoking avoidance were discussed with patient. . See orders for this visit as documented in the electronic medical record. . Patient received an After Visit Summary.  CMA served as Education administrator during this visit. History, Physical, and Plan performed by medical provider. The above documentation has been reviewed and is accurate and complete. Heather [name], D.O.  Heather Deutscher, DO [place], [facility] 06/11/2018

## 2018-06-05 ENCOUNTER — Encounter: Payer: Self-pay | Admitting: Family Medicine

## 2018-06-05 ENCOUNTER — Telehealth: Payer: Self-pay

## 2018-06-05 ENCOUNTER — Ambulatory Visit (INDEPENDENT_AMBULATORY_CARE_PROVIDER_SITE_OTHER): Payer: Managed Care, Other (non HMO) | Admitting: Family Medicine

## 2018-06-05 VITALS — BP 136/86 | HR 85 | Temp 98.0°F | Ht 68.25 in | Wt 238.2 lb

## 2018-06-05 DIAGNOSIS — Z794 Long term (current) use of insulin: Secondary | ICD-10-CM | POA: Diagnosis not present

## 2018-06-05 DIAGNOSIS — N185 Chronic kidney disease, stage 5: Secondary | ICD-10-CM

## 2018-06-05 DIAGNOSIS — E1165 Type 2 diabetes mellitus with hyperglycemia: Secondary | ICD-10-CM | POA: Diagnosis not present

## 2018-06-05 DIAGNOSIS — E1129 Type 2 diabetes mellitus with other diabetic kidney complication: Secondary | ICD-10-CM | POA: Diagnosis not present

## 2018-06-05 DIAGNOSIS — G479 Sleep disorder, unspecified: Secondary | ICD-10-CM | POA: Diagnosis not present

## 2018-06-05 DIAGNOSIS — E119 Type 2 diabetes mellitus without complications: Secondary | ICD-10-CM | POA: Diagnosis not present

## 2018-06-05 DIAGNOSIS — R197 Diarrhea, unspecified: Secondary | ICD-10-CM | POA: Diagnosis not present

## 2018-06-05 DIAGNOSIS — R112 Nausea with vomiting, unspecified: Secondary | ICD-10-CM | POA: Diagnosis not present

## 2018-06-05 DIAGNOSIS — IMO0002 Reserved for concepts with insufficient information to code with codable children: Secondary | ICD-10-CM

## 2018-06-05 LAB — CBC WITH DIFFERENTIAL/PLATELET
Basophils Absolute: 0 10*3/uL (ref 0.0–0.1)
Basophils Relative: 0.8 % (ref 0.0–3.0)
Eosinophils Absolute: 0.1 10*3/uL (ref 0.0–0.7)
Eosinophils Relative: 1.7 % (ref 0.0–5.0)
HCT: 33.3 % — ABNORMAL LOW (ref 36.0–46.0)
Hemoglobin: 10.5 g/dL — ABNORMAL LOW (ref 12.0–15.0)
Lymphocytes Relative: 23 % (ref 12.0–46.0)
Lymphs Abs: 1.4 10*3/uL (ref 0.7–4.0)
MCHC: 31.6 g/dL (ref 30.0–36.0)
MCV: 96.4 fl (ref 78.0–100.0)
Monocytes Absolute: 0.4 10*3/uL (ref 0.1–1.0)
Monocytes Relative: 7.1 % (ref 3.0–12.0)
Neutro Abs: 4 10*3/uL (ref 1.4–7.7)
Neutrophils Relative %: 67.4 % (ref 43.0–77.0)
Platelets: 233 10*3/uL (ref 150.0–400.0)
RBC: 3.46 Mil/uL — ABNORMAL LOW (ref 3.87–5.11)
RDW: 16.3 % — ABNORMAL HIGH (ref 11.5–15.5)
WBC: 6 10*3/uL (ref 4.0–10.5)

## 2018-06-05 LAB — LIPASE: Lipase: 13 U/L (ref 11.0–59.0)

## 2018-06-05 LAB — COMPREHENSIVE METABOLIC PANEL
ALT: 12 U/L (ref 0–35)
AST: 13 U/L (ref 0–37)
Albumin: 2.7 g/dL — ABNORMAL LOW (ref 3.5–5.2)
Alkaline Phosphatase: 65 U/L (ref 39–117)
BUN: 34 mg/dL — ABNORMAL HIGH (ref 6–23)
CO2: 20 mEq/L (ref 19–32)
Calcium: 8 mg/dL — ABNORMAL LOW (ref 8.4–10.5)
Chloride: 108 mEq/L (ref 96–112)
Creatinine, Ser: 5.77 mg/dL (ref 0.40–1.20)
GFR: 10.05 mL/min — CL (ref >60.00)
Glucose, Bld: 150 mg/dL — ABNORMAL HIGH (ref 70–99)
Potassium: 3.7 mEq/L (ref 3.5–5.1)
Sodium: 137 mEq/L (ref 135–145)
Total Bilirubin: 0.3 mg/dL (ref 0.2–1.2)
Total Protein: 5.8 g/dL — ABNORMAL LOW (ref 6.0–8.3)

## 2018-06-05 LAB — MAGNESIUM: Magnesium: 1.7 mg/dL (ref 1.5–2.5)

## 2018-06-05 LAB — HEMOGLOBIN A1C: Hgb A1c MFr Bld: 5.8 % (ref 4.6–6.5)

## 2018-06-05 NOTE — Telephone Encounter (Signed)
Heather [name] said she talk to you about this.

## 2018-06-05 NOTE — Patient Instructions (Signed)
Stop antibiotics  Restart Plavix

## 2018-06-05 NOTE — Telephone Encounter (Signed)
See result note.  

## 2018-06-05 NOTE — Telephone Encounter (Signed)
[facility] lab called with critical results on pt. Creatinine 5.77 and GFR 10.05

## 2018-06-06 ENCOUNTER — Encounter (HOSPITAL_COMMUNITY)
Admission: RE | Admit: 2018-06-06 | Discharge: 2018-06-06 | Disposition: A | Discharge status: Home / Self Care | Payer: Managed Care, Other (non HMO) | Source: Ambulatory Visit | Attending: Nephrology | Admitting: Nephrology

## 2018-06-06 VITALS — BP 154/86 | HR 79 | Resp 18

## 2018-06-06 DIAGNOSIS — N182 Chronic kidney disease, stage 2 (mild): Secondary | ICD-10-CM | POA: Insufficient documentation

## 2018-06-06 LAB — CUP PACEART REMOTE DEVICE CHECK
MDC IDC PG IMPLANT DT: 20181210
MDC IDC SESS DTM: 20190518223518

## 2018-06-06 LAB — IRON AND TIBC
IRON: 40 ug/dL (ref 28–170)
Saturation Ratios: 19 % (ref 10.4–31.8)
TIBC: 209 ug/dL — ABNORMAL LOW (ref 250–450)
UIBC: 169 ug/dL

## 2018-06-06 LAB — FERRITIN: Ferritin: 531 ng/mL — ABNORMAL HIGH (ref 11–307)

## 2018-06-06 LAB — POCT HEMOGLOBIN-HEMACUE: Hemoglobin: 10.7 g/dL — ABNORMAL LOW (ref 12.0–15.0)

## 2018-06-06 MED ORDER — EPOETIN ALFA 40000 UNIT/ML IJ SOLN
40000.0000 [IU] | INTRAMUSCULAR | Status: DC
  Administered 2018-06-06: 40000 [IU] via SUBCUTANEOUS

## 2018-06-06 MED ORDER — EPOETIN ALFA 40000 UNIT/ML IJ SOLN
INTRAMUSCULAR | Status: AC
  Administered 2018-06-06: 40000 [IU] via SUBCUTANEOUS
  Filled 2018-06-06: qty 1

## 2018-06-07 ENCOUNTER — Encounter (HOSPITAL_COMMUNITY): Payer: Managed Care, Other (non HMO)

## 2018-06-11 ENCOUNTER — Encounter: Payer: Self-pay | Admitting: Family Medicine

## 2018-06-13 NOTE — H&P (View-Only) (Signed)
Established Dialysis Access   History of Present Illness   Heather [name] is a 47 y.o. (04/30/1971) female who presents for re-evaluation for permanent access.    Prior procedures include: 1. TE R brachial artery, radial artery and ulnar artery (02/17/18) 2. VPA R brachial artery, Removal of RUA AVG (02/17/18) 3. RUA AVG (02/17/18) 4. LUA AVG Artegraft, ligation of L 1st BrVT (05/09/17) 5. L 1st stage BrVT (08/31/16)  Her last procedure was complicated by severe steal and unexpected thrombosis of the brachial artery.  The patient was referred to OT for recover from her steal sx.  She previously had issues with the Artegraft placed in her left arm.  Pt comes today for evaluation for thigh AVG.  Right hand sx are resolved after OT.   Past Medical History:  Diagnosis Date  . Allergy   . Anemia of chronic disease   . B12 deficiency 05/10/2014  . Blood transfusion without reported diagnosis   . Bronchitis   . Chronic constipation   . Chronic kidney disease   . Constipation   . CVA (cerebral vascular accident) ([place]) 12/03/2017   nonhemorrhagic infarct-- inferior right cerebellum  . Diabetic neuropathy, painful ([place]) 07/13/2013   Stop lyrica due to cost & SE of drowsy. Gabapentin helps Stay off hydrocodone: pt states she thinks "she was addicted" to med. "I don't want that anymore."   . DM2 (diabetes mellitus, type 2) ([place])   . Dyspnea    with exertion  . Fibromyalgia   . Gastroparesis due to DM   . GERD (gastroesophageal reflux disease)   . Headache   . Heart murmur, systolic 56/02/8936   Grade 1/6 ejection murmur per [facility] notes.   . Hyperlipemia   . Hypertension   . IBS (irritable bowel syndrome)   . Infertility   . Leiomyoma of uterus   . Morbid obesity due to excess calories ([place]) 11/16/2016  . Neuropathy   . Obesity   . Pancreatitis   . Pneumonia   . 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 Right-sided low back pain without sciatica 12/08/2015  .  Secondary hyperthyroidism 11/27/2013   Followed by [facility], notes reviewed 01/01/15. PTH had improved with oral vitamin D: 133 to 85. Last 155-probably not taking vit D.    [name] Upper airway cough syndrome 11/15/2016   rec off acei/ on pepcid hs 11/15/2016 >>>     Past Surgical History:  Procedure Laterality Date  . ARTERY REPAIR Right 02/17/2018   Procedure: EXPLORATION OF RIGHT BRACHIAL ARTERY;  Surgeon: Conrad Bethel, MD;  Location: [place];  Service: Vascular;  Laterality: Right;  . ARTERY REPAIR Right 02/17/2018   Procedure: BRACHIAL ARTERY EXPLORATION AND TRHOMBECTOMY;  Surgeon: Conrad [place], MD;  Location: [facility] OR;  Service: Vascular;  Laterality: Right;  . AV FISTULA PLACEMENT Left 05/09/2017   Procedure: INSERTION OF ARTERIOVENOUS (AV) GRAFT ARM (ARTEGRAFT);  Surgeon: Conrad [place], MD;  Location: [facility] OR;  Service: Vascular;  Laterality: Left;  . AV FISTULA PLACEMENT Right 02/17/2018   Procedure: INSERTION OF ARTERIOVENOUS (AV) GORE-TEX GRAFT ARM RIGHT UPPER ARM;  Surgeon: Conrad [place], MD;  Location: [place];  Service: Vascular;  Laterality: Right;  . BASCILIC VEIN TRANSPOSITION Left 08/31/2016   Procedure: BASILIC VEIN TRANSPOSITION FIRST STAGE;  Surgeon: Conrad [place], MD;  Location: [place];  Service: Vascular;  Laterality: Left;  . FOOT SURGERY Right    t"ook bone out- maybe hammer toe"  .  LIGATION OF ARTERIOVENOUS  FISTULA Left 05/09/2017   Procedure: LIGATION OF ARTERIOVENOUS  FISTULA;  Surgeon: Conrad [place], MD;  Location: [place];  Service: Vascular;  Laterality: Left;  . LOOP RECORDER INSERTION N/A 12/05/2017   Procedure: LOOP RECORDER INSERTION;  Surgeon: Evans Lance, MD;  Location: [place] CV LAB;  Service: Cardiovascular;  Laterality: N/A;  . MYOMECTOMY    . REMOVAL OF GRAFT Right 02/17/2018   Procedure: REMOVAL OF RIGHT UPPER ARM ARTERIOVENOUS GRAFT;  Surgeon: Conrad [place], MD;  Location: [place];  Service: Vascular;  Laterality: Right;  . TEE WITHOUT  CARDIOVERSION N/A 12/05/2017   Procedure: TRANSESOPHAGEAL ECHOCARDIOGRAM (TEE);  Surgeon: Sanda Klein, MD;  Location: [facility] ENDOSCOPY;  Service: Cardiovascular;  Laterality: N/A;  . VITRECTOMY Bilateral     Social History   Socioeconomic History  . Marital status: Married    Spouse name: Not on file  . Number of children: 1  . Years of education: college  . Highest education level: Not on file  Occupational History  . Occupation: Armed forces technical officer: [employer]  Social Needs  . Financial resource strain: Not on file  . Food insecurity:    Worry: Not on file    Inability: Not on file  . Transportation needs:    Medical: Not on file    Non-medical: Not on file  Tobacco Use  . Smoking status: Never Smoker  . Smokeless tobacco: Never Used  Substance and Sexual Activity  . Alcohol use: No    Alcohol/week: 0.0 oz  . Drug use: No  . Sexual activity: Yes    Partners: Male    Birth control/protection: None  Lifestyle  . Physical activity:    Days per week: Not on file    Minutes per session: Not on file  . Stress: Not on file  Relationships  . Social connections:    Talks on phone: Not on file    Gets together: Not on file    Attends religious service: Not on file    Active member of club or organization: Not on file    Attends meetings of clubs or organizations: Not on file    Relationship status: Not on file  . Intimate partner violence:    Fear of current or ex partner: Not on file    Emotionally abused: Not on file    Physically abused: Not on file    Forced sexual activity: Not on file  Other Topics Concern  . Not on file  Social History Narrative   Patient lives with fiance. She has 1 grown son. Works full-time, she Paramedic for attorney.   Caffeine Use: 2 cups daily; sodas occasionally    Family History  Problem Relation Age of Onset  . Colon polyps Mother   . Diabetes Mother   . Heart murmur Father   . Hypertension Father   .  Diabetes Sister   . Kidney failure Sister   . Diabetes Brother   . Retinal degeneration Brother   . Heart murmur Son   . Stroke Paternal Grandmother   . Diabetes Sister     Current Outpatient Medications  Medication Sig Dispense Refill  . albuterol (PROVENTIL HFA;VENTOLIN HFA) 108 (90 Base) MCG/ACT inhaler Inhale 1 puff into the lungs 4 (four) times daily.    [name] amLODipine (NORVASC) 10 MG tablet Take 10 mg by mouth at bedtime.    . Blood Glucose Monitoring Suppl ([place])  w/Device KIT by Does not apply route 4 (four) times daily.    . calcitRIOL (ROCALTROL) 0.25 MCG capsule Take 0.75 mcg by mouth daily.     . carvedilol (COREG) 12.5 MG tablet Take 12.5 mg by mouth daily.    . clarithromycin (BIAXIN) 500 MG tablet Take 1 tablet (500 mg total) by mouth 2 (two) times daily. 28 tablet 0  . clopidogrel (PLAVIX) 75 MG tablet Take 1 tablet (75 mg total) by mouth daily. (Patient not taking: Reported on 06/05/2018) 90 tablet 3  . Continuous Blood Gluc Receiver (FREESTYLE LIBRE 14 DAY READER) DEVI See admin instructions.  0  . epoetin alfa (EPOGEN,PROCRIT) 98119 UNIT/ML injection Inject 30,000 Units into the vein every 14 (fourteen) days.     [name] FLOVENT HFA 220 MCG/ACT inhaler INHALE 2 PUFFS INTO THE LUNGS DAILY.  12  . furosemide (LASIX) 80 MG tablet Take 160 mg by mouth daily.    [name] gabapentin (NEURONTIN) 300 MG capsule Take 1 capsule (300 mg total) by mouth 2 (two) times daily. 180 capsule 1  . glucose blood (ONETOUCH VERIO) test strip 4 (four) times daily.    . insulin aspart (NOVOLOG) 100 UNIT/ML injection Inject 6-8 Units into the skin 3 (three) times daily before meals. 8 UNITS WITH BREAKFAST, 6 UNITS WITH LUNCH, & 8 UNITS WITH SUPPER    . insulin degludec (TRESIBA FLEXTOUCH) 100 UNIT/ML SOPN FlexTouch Pen Inject 28 Units into the skin daily with lunch.     . lubiprostone (AMITIZA) 24 MCG capsule Take 1 capsule (24 mcg total) by mouth 2 (two) times daily with a meal. (Patient  not taking: Reported on 06/05/2018) 60 capsule 5  . metroNIDAZOLE (FLAGYL) 500 MG tablet Take 1 tablet (500 mg total) by mouth 2 (two) times daily. 28 tablet 0  . ONE TOUCH LANCETS MISC 4 (four) times daily.    . pantoprazole (PROTONIX) 40 MG tablet Take 1 tablet (40 mg total) by mouth daily. 30 tablet 5  . simvastatin (ZOCOR) 10 MG tablet Take 0.5 tablets (5 mg total) by mouth at bedtime. 1/2 po q hs - GFR 14 45 tablet 1   Current Facility-Administered Medications  Medication Dose Route Frequency Provider Last Rate Last Dose  . 0.9 %  sodium chloride infusion  500 mL Intravenous Once Armbruster, Carlota Raspberry, MD         Allergies  Allergen Reactions  . Ibuprofen Other (See Comments)    CKD stage 3. Should avoid.  . Dilaudid [Hydromorphone Hcl] Itching and Other (See Comments)    Can take with Benadryl.    REVIEW OF SYSTEMS (negative unless checked):   Cardiac:  '[]'  Chest pain or chest pressure? '[]'  Shortness of breath upon activity? '[]'  Shortness of breath when lying flat? '[]'  Irregular heart rhythm?  Vascular:  '[]'  Pain in calf, thigh, or hip brought on by walking? '[]'  Pain in feet at night that wakes you up from your sleep? '[]'  Blood clot in your veins? '[]'  Leg swelling?  Pulmonary:  '[]'  Oxygen at home? '[]'  Productive cough? '[]'  Wheezing?  Neurologic:  '[]'  Sudden weakness in arms or legs? '[]'  Sudden numbness in arms or legs? '[]'  Sudden onset of difficult speaking or slurred speech? '[]'  Temporary loss of vision in one eye? '[]'  Problems with dizziness?  Gastrointestinal:  '[]'  Blood in stool? '[]'  Vomited blood?  Genitourinary:  '[]'  Burning when urinating? '[]'  Blood in urine?  Psychiatric:  '[]'  Major depression  Hematologic:  '[]'  Bleeding problems? '[]'  Problems with blood  clotting?  Dermatologic:  '[]'  Rashes or ulcers?  Constitutional:  '[]'  Fever or chills?  Ear/Nose/Throat:  '[]'  Change in hearing? '[]'  Nose bleeds? '[]'  Sore throat?  Musculoskeletal:  '[]'  Back pain? '[]'  Joint  pain? '[]'  Muscle pain?   Physical Examination   Vitals:   06/14/18 1110 06/14/18 1113  BP: (!) 176/90 (!) 172/92  Pulse: 75   Resp: 15   Temp: (!) 97.2 F (36.2 C)   TempSrc: Oral   SpO2: 98%   Weight: 242 lb 14.4 oz (110.2 kg)   Height: '5\' 8"'  (1.727 m)    Body mass index is 36.93 kg/m.  General Alert, O x 3, WD, NAD  Pulmonary Sym exp, good B air movt, CTA B  Cardiac RRR, Nl S1, S2, no Murmurs, No rubs, No S3,S4  Vascular Vessel Right Left  Radial Palpable Palpable  Brachial Palpable Palpable  Ulnar Not palpable Not palpable    Musculo- skeletal M/S 5/5 throughout  , Extremities without ischemic changes, R arm inc c/d/i    Neurologic Pain and light touch intact in extremities , Motor exam as listed above   Non-invasive Vascular Imaging   ABI (06/14/2018)  R:   ABI: 0.86,   PT: mono  DP: bi  TBI:  0.86  L:   ABI: 1.03,   PT: bi  DP: bi  TBI: 0.92   Aortoiliac duplex (06/14/2018)  No evidence of aortoiliac segment stenoses   Medical Decision Making   ANN-MARIE KLUGE is a 47 y.o. 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    Studies in left leg are favorable for L thigh AVG placement.  I would do a limited L arm and central venogram to see if L arm is candidate for LUA AVG prior to proceeding with L thigh AVG.  This will require only 5-10 cc of contrast, so I don't believe this will result in ESRD. Risk of extremity and central venography include but are not limited to: anaphylaxis, bleeding, infection, acute renal failure and incomplete visualization of the venous system.  The patient has agreed to proceed with the above procedure which will be scheduled 27 JUN 19.   Adele Barthel, MD, FACS [facility] Office: [phone] Pager: [phone]

## 2018-06-13 NOTE — Progress Notes (Signed)
Established Dialysis Access   History of Present Illness   Heather [name] is a 47 y.o. ([birth year]) female who presents for re-evaluation for permanent access.    Prior procedures include: 1. TE R brachial artery, radial artery and ulnar artery (02/17/18) 2. VPA R brachial artery, Removal of RUA AVG (02/17/18) 3. RUA AVG (02/17/18) 4. LUA AVG Artegraft, ligation of L 1st BrVT (05/09/17) 5. L 1st stage BrVT (08/31/16)  Her last procedure was complicated by severe steal and unexpected thrombosis of the brachial artery.  The patient was referred to OT for recover from her steal sx.  She previously had issues with the Artegraft placed in her left arm.  Pt comes today for evaluation for thigh AVG.  Right hand sx are resolved after OT.   Past Medical History:  Diagnosis Date  . Allergy   . Anemia of chronic disease   . B12 deficiency 05/10/2014  . Blood transfusion without reported diagnosis   . Bronchitis   . Chronic constipation   . Chronic kidney disease   . Constipation   . CVA (cerebral vascular accident) ([place]) 12/03/2017   nonhemorrhagic infarct-- inferior right cerebellum  . Diabetic neuropathy, painful ([place]) 07/13/2013   Stop lyrica due to cost & SE of drowsy. Gabapentin helps Stay off hydrocodone: pt states she thinks "she was addicted" to med. "I don't want that anymore."   . DM2 (diabetes mellitus, type 2) ([place])   . Dyspnea    with exertion  . Fibromyalgia   . Gastroparesis due to DM   . GERD (gastroesophageal reflux disease)   . Headache   . Heart murmur, systolic 39/04/3201   Grade 1/6 ejection murmur per [facility] notes.   . Hyperlipemia   . Hypertension   . IBS (irritable bowel syndrome)   . Infertility   . Leiomyoma of uterus   . Morbid obesity due to excess calories ([place]) 11/16/2016  . Neuropathy   . Obesity   . Pancreatitis   . Pneumonia   . 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 Right-sided low back pain without sciatica 12/08/2015  .  Secondary hyperthyroidism 11/27/2013   Followed by [facility], notes reviewed 01/01/15. PTH had improved with oral vitamin D: 133 to 85. Last 155-probably not taking vit D.    [name] Upper airway cough syndrome 11/15/2016   rec off acei/ on pepcid hs 11/15/2016 >>>     Past Surgical History:  Procedure Laterality Date  . ARTERY REPAIR Right 02/17/2018   Procedure: EXPLORATION OF RIGHT BRACHIAL ARTERY;  Surgeon: Conrad [place]er, MD;  Location: [place];  Service: Vascular;  Laterality: Right;  . ARTERY REPAIR Right 02/17/2018   Procedure: BRACHIAL ARTERY EXPLORATION AND TRHOMBECTOMY;  Surgeon: Conrad [place], MD;  Location: [facility] OR;  Service: Vascular;  Laterality: Right;  . AV FISTULA PLACEMENT Left 05/09/2017   Procedure: INSERTION OF ARTERIOVENOUS (AV) GRAFT ARM (ARTEGRAFT);  Surgeon: Conrad Oliver, MD;  Location: [facility] OR;  Service: Vascular;  Laterality: Left;  . AV FISTULA PLACEMENT Right 02/17/2018   Procedure: INSERTION OF ARTERIOVENOUS (AV) GORE-TEX GRAFT ARM RIGHT UPPER ARM;  Surgeon: Conrad [place], MD;  Location: [place];  Service: Vascular;  Laterality: Right;  . BASCILIC VEIN TRANSPOSITION Left 08/31/2016   Procedure: BASILIC VEIN TRANSPOSITION FIRST STAGE;  Surgeon: Conrad R[place]ana, MD;  Location: [place];  Service: Vascular;  Laterality: Left;  . FOOT SURGERY Right    t"ook bone out- maybe hammer toe"  .  LIGATION OF ARTERIOVENOUS  FISTULA Left 05/09/2017   Procedure: LIGATION OF ARTERIOVENOUS  FISTULA;  Surgeon: Conrad [place], MD;  Location: [place];  Service: Vascular;  Laterality: Left;  . LOOP RECORDER INSERTION N/A 12/05/2017   Procedure: LOOP RECORDER INSERTION;  Surgeon: Evans Lance, MD;  Location: [place] CV LAB;  Service: Cardiovascular;  Laterality: N/A;  . MYOMECTOMY    . REMOVAL OF GRAFT Right 02/17/2018   Procedure: REMOVAL OF RIGHT UPPER ARM ARTERIOVENOUS GRAFT;  Surgeon: Conrad [place]do, MD;  Location: [place];  Service: Vascular;  Laterality: Right;  . TEE WITHOUT  CARDIOVERSION N/A 12/05/2017   Procedure: TRANSESOPHAGEAL ECHOCARDIOGRAM (TEE);  Surgeon: Sanda Klein, MD;  Location: [facility] ENDOSCOPY;  Service: Cardiovascular;  Laterality: N/A;  . VITRECTOMY Bilateral     Social History   Socioeconomic History  . Marital status: Married    Spouse name: Not on file  . Number of children: 1  . Years of education: college  . Highest education level: Not on file  Occupational History  . Occupation: Armed forces technical officer: [employer]  Social Needs  . Financial resource strain: Not on file  . Food insecurity:    Worry: Not on file    Inability: Not on file  . Transportation needs:    Medical: Not on file    Non-medical: Not on file  Tobacco Use  . Smoking status: Never Smoker  . Smokeless tobacco: Never Used  Substance and Sexual Activity  . Alcohol use: No    Alcohol/week: 0.0 oz  . Drug use: No  . Sexual activity: Yes    Partners: Male    Birth control/protection: None  Lifestyle  . Physical activity:    Days per week: Not on file    Minutes per session: Not on file  . Stress: Not on file  Relationships  . Social connections:    Talks on phone: Not on file    Gets together: Not on file    Attends religious service: Not on file    Active member of club or organization: Not on file    Attends meetings of clubs or organizations: Not on file    Relationship status: Not on file  . Intimate partner violence:    Fear of current or ex partner: Not on file    Emotionally abused: Not on file    Physically abused: Not on file    Forced sexual activity: Not on file  Other Topics Concern  . Not on file  Social History Narrative   Patient lives with fiance. She has 1 grown son. Works full-time, she Paramedic for attorney.   Caffeine Use: 2 cups daily; sodas occasionally    Family History  Problem Relation Age of Onset  . Colon polyps Mother   . Diabetes Mother   . Heart murmur Father   . Hypertension Father   .  Diabetes Sister   . Kidney failure Sister   . Diabetes Brother   . Retinal degeneration Brother   . Heart murmur Son   . Stroke Paternal Grandmother   . Diabetes Sister     Current Outpatient Medications  Medication Sig Dispense Refill  . albuterol (PROVENTIL HFA;VENTOLIN HFA) 108 (90 Base) MCG/ACT inhaler Inhale 1 puff into the lungs 4 (four) times daily.    [name] amLODipine (NORVASC) 10 MG tablet Take 10 mg by mouth at bedtime.    . Blood Glucose Monitoring Suppl ([place])  w/Device KIT by Does not apply route 4 (four) times daily.    . calcitRIOL (ROCALTROL) 0.25 MCG capsule Take 0.75 mcg by mouth daily.     . carvedilol (COREG) 12.5 MG tablet Take 12.5 mg by mouth daily.    . clarithromycin (BIAXIN) 500 MG tablet Take 1 tablet (500 mg total) by mouth 2 (two) times daily. 28 tablet 0  . clopidogrel (PLAVIX) 75 MG tablet Take 1 tablet (75 mg total) by mouth daily. (Patient not taking: Reported on 06/05/2018) 90 tablet 3  . Continuous Blood Gluc Receiver (FREESTYLE LIBRE 14 DAY READER) DEVI See admin instructions.  0  . epoetin alfa (EPOGEN,PROCRIT) 24401 UNIT/ML injection Inject 30,000 Units into the vein every 14 (fourteen) days.     [name] FLOVENT HFA 220 MCG/ACT inhaler INHALE 2 PUFFS INTO THE LUNGS DAILY.  12  . furosemide (LASIX) 80 MG tablet Take 160 mg by mouth daily.    [name] gabapentin (NEURONTIN) 300 MG capsule Take 1 capsule (300 mg total) by mouth 2 (two) times daily. 180 capsule 1  . glucose blood (ONETOUCH VERIO) test strip 4 (four) times daily.    . insulin aspart (NOVOLOG) 100 UNIT/ML injection Inject 6-8 Units into the skin 3 (three) times daily before meals. 8 UNITS WITH BREAKFAST, 6 UNITS WITH LUNCH, & 8 UNITS WITH SUPPER    . insulin degludec (TRESIBA FLEXTOUCH) 100 UNIT/ML SOPN FlexTouch Pen Inject 28 Units into the skin daily with lunch.     . lubiprostone (AMITIZA) 24 MCG capsule Take 1 capsule (24 mcg total) by mouth 2 (two) times daily with a meal. (Patient  not taking: Reported on 06/05/2018) 60 capsule 5  . metroNIDAZOLE (FLAGYL) 500 MG tablet Take 1 tablet (500 mg total) by mouth 2 (two) times daily. 28 tablet 0  . ONE TOUCH LANCETS MISC 4 (four) times daily.    . pantoprazole (PROTONIX) 40 MG tablet Take 1 tablet (40 mg total) by mouth daily. 30 tablet 5  . simvastatin (ZOCOR) 10 MG tablet Take 0.5 tablets (5 mg total) by mouth at bedtime. 1/2 po q hs - GFR 14 45 tablet 1   Current Facility-Administered Medications  Medication Dose Route Frequency Provider Last Rate Last Dose  . 0.9 %  sodium chloride infusion  500 mL Intravenous Once Armbruster, Carlota Raspberry, MD         Allergies  Allergen Reactions  . Ibuprofen Other (See Comments)    CKD stage 3. Should avoid.  . Dilaudid [Hydromorphone Hcl] Itching and Other (See Comments)    Can take with Benadryl.    REVIEW OF SYSTEMS (negative unless checked):   Cardiac:  '[]'  Chest pain or chest pressure? '[]'  Shortness of breath upon activity? '[]'  Shortness of breath when lying flat? '[]'  Irregular heart rhythm?  Vascular:  '[]'  Pain in calf, thigh, or hip brought on by walking? '[]'  Pain in feet at night that wakes you up from your sleep? '[]'  Blood clot in your veins? '[]'  Leg swelling?  Pulmonary:  '[]'  Oxygen at home? '[]'  Productive cough? '[]'  Wheezing?  Neurologic:  '[]'  Sudden weakness in arms or legs? '[]'  Sudden numbness in arms or legs? '[]'  Sudden onset of difficult speaking or slurred speech? '[]'  Temporary loss of vision in one eye? '[]'  Problems with dizziness?  Gastrointestinal:  '[]'  Blood in stool? '[]'  Vomited blood?  Genitourinary:  '[]'  Burning when urinating? '[]'  Blood in urine?  Psychiatric:  '[]'  Major depression  Hematologic:  '[]'  Bleeding problems? '[]'  Problems with blood  clotting?  Dermatologic:  '[]'  Rashes or ulcers?  Constitutional:  '[]'  Fever or chills?  Ear/Nose/Throat:  '[]'  Change in hearing? '[]'  Nose bleeds? '[]'  Sore throat?  Musculoskeletal:  '[]'  Back pain? '[]'  Joint  pain? '[]'  Muscle pain?   Physical Examination   Vitals:   06/14/18 1110 06/14/18 1113  BP: (!) 176/90 (!) 172/92  Pulse: 75   Resp: 15   Temp: (!) 97.2 F (36.2 C)   TempSrc: Oral   SpO2: 98%   Weight: 242 lb 14.4 oz (110.2 kg)   Height: '5\' 8"'  (1.727 m)    Body mass index is 36.93 kg/m.  General Alert, O x 3, WD, NAD  Pulmonary Sym exp, good B air movt, CTA B  Cardiac RRR, Nl S1, S2, no Murmurs, No rubs, No S3,S4  Vascular Vessel Right Left  Radial Palpable Palpable  Brachial Palpable Palpable  Ulnar Not palpable Not palpable    Musculo- skeletal M/S 5/5 throughout  , Extremities without ischemic changes, R arm inc c/d/i    Neurologic Pain and light touch intact in extremities , Motor exam as listed above   Non-invasive Vascular Imaging   ABI (06/14/2018)  R:   ABI: 0.86,   PT: mono  DP: bi  TBI:  0.86  L:   ABI: 1.03,   PT: bi  DP: bi  TBI: 0.92   Aortoiliac duplex (06/14/2018)  No evidence of aortoiliac segment stenoses   Medical Decision Making   BAYYINAH DUKEMAN is a 47 y.o. 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    Studies in left leg are favorable for L thigh AVG placement.  I would do a limited L arm and central venogram to see if L arm is candidate for LUA AVG prior to proceeding with L thigh AVG.  This will require only 5-10 cc of contrast, so I don't believe this will result in ESRD. Risk of extremity and central venography include but are not limited to: anaphylaxis, bleeding, infection, acute renal failure and incomplete visualization of the venous system.  The patient has agreed to proceed with the above procedure which will be scheduled 27 JUN 19.   Adele Barthel, MD, FACS [facility] [place] Office: [phone] Pager: [phone]

## 2018-06-14 ENCOUNTER — Other Ambulatory Visit: Payer: Self-pay

## 2018-06-14 ENCOUNTER — Encounter: Payer: Self-pay | Admitting: Family Medicine

## 2018-06-14 ENCOUNTER — Ambulatory Visit (HOSPITAL_COMMUNITY)
Admission: RE | Admit: 2018-06-14 | Discharge: 2018-06-14 | Disposition: A | Discharge status: Home / Self Care | Payer: Managed Care, Other (non HMO) | Source: Ambulatory Visit | Attending: Vascular Surgery | Admitting: Vascular Surgery

## 2018-06-14 ENCOUNTER — Ambulatory Visit (INDEPENDENT_AMBULATORY_CARE_PROVIDER_SITE_OTHER): Payer: Managed Care, Other (non HMO) | Admitting: Vascular Surgery

## 2018-06-14 ENCOUNTER — Encounter: Payer: Self-pay | Admitting: Vascular Surgery

## 2018-06-14 VITALS — BP 172/92 | HR 75 | Temp 97.2°F | Resp 15 | Ht 68.0 in | Wt 242.9 lb

## 2018-06-14 DIAGNOSIS — I739 Peripheral vascular disease, unspecified: Secondary | ICD-10-CM | POA: Insufficient documentation

## 2018-06-14 DIAGNOSIS — N185 Chronic kidney disease, stage 5: Secondary | ICD-10-CM

## 2018-06-14 DIAGNOSIS — R600 Localized edema: Secondary | ICD-10-CM | POA: Diagnosis present

## 2018-06-14 DIAGNOSIS — N189 Chronic kidney disease, unspecified: Secondary | ICD-10-CM | POA: Insufficient documentation

## 2018-06-15 ENCOUNTER — Ambulatory Visit (INDEPENDENT_AMBULATORY_CARE_PROVIDER_SITE_OTHER): Payer: Managed Care, Other (non HMO) | Admitting: *Deleted

## 2018-06-15 ENCOUNTER — Other Ambulatory Visit (HOSPITAL_COMMUNITY): Payer: Self-pay

## 2018-06-15 DIAGNOSIS — I639 Cerebral infarction, unspecified: Secondary | ICD-10-CM

## 2018-06-16 ENCOUNTER — Telehealth: Payer: Self-pay | Admitting: *Deleted

## 2018-06-16 ENCOUNTER — Ambulatory Visit (HOSPITAL_COMMUNITY): Admission: RE | Admit: 2018-06-16 | Payer: Managed Care, Other (non HMO) | Source: Ambulatory Visit

## 2018-06-16 NOTE — Progress Notes (Signed)
[facility] Summary Report / Loop Recorder 

## 2018-06-16 NOTE — Telephone Encounter (Signed)
Message left on patient's voice mail to call this nurse back to schedule procedure. Patient rushed out of office on 06/14/18 without scheduling this procedure due to "work emergency will call back to schedule". Pending date was 06/22/18.

## 2018-06-20 ENCOUNTER — Encounter (HOSPITAL_COMMUNITY): Payer: Managed Care, Other (non HMO)

## 2018-06-20 ENCOUNTER — Ambulatory Visit (HOSPITAL_COMMUNITY)
Admission: RE | Admit: 2018-06-20 | Discharge: 2018-06-20 | Disposition: A | Discharge status: Home / Self Care | Payer: Managed Care, Other (non HMO) | Source: Ambulatory Visit | Attending: Nephrology | Admitting: Nephrology

## 2018-06-20 VITALS — BP 163/88 | HR 74 | Resp 18

## 2018-06-20 DIAGNOSIS — N183 Chronic kidney disease, stage 3 (moderate): Secondary | ICD-10-CM | POA: Diagnosis present

## 2018-06-20 DIAGNOSIS — D631 Anemia in chronic kidney disease: Secondary | ICD-10-CM | POA: Diagnosis present

## 2018-06-20 DIAGNOSIS — N182 Chronic kidney disease, stage 2 (mild): Secondary | ICD-10-CM

## 2018-06-20 LAB — POCT HEMOGLOBIN-HEMACUE: Hemoglobin: 10.6 g/dL — ABNORMAL LOW (ref 12.0–15.0)

## 2018-06-20 MED ORDER — EPOETIN ALFA 40000 UNIT/ML IJ SOLN
INTRAMUSCULAR | Status: AC
  Administered 2018-06-20: 40000 [IU]
  Filled 2018-06-20: qty 1

## 2018-06-20 MED ORDER — SODIUM CHLORIDE 0.9 % IV SOLN
510.0000 mg | Freq: Once | INTRAVENOUS | Status: AC
  Administered 2018-06-20: 510 mg via INTRAVENOUS
  Filled 2018-06-20: qty 17

## 2018-06-20 MED ORDER — EPOETIN ALFA 40000 UNIT/ML IJ SOLN: 40000.0000 [IU] | INTRAMUSCULAR | Status: DC

## 2018-06-21 ENCOUNTER — Other Ambulatory Visit: Payer: Self-pay | Admitting: *Deleted

## 2018-06-21 NOTE — Progress Notes (Signed)
Patient chose date for procedure. Instructed to be at [facility] admitting at 5:30 am on 07/13/18. NPO past MN night prior and must have a driver for home. Stay on Plavix and take amlodipine, coreg, and lasix am of with sips of water. Insulin adjustment to include: HS snack, 1/2 dose bedtime Novolog 07/12/18. [name] 1/2 dose am of 07/13/18. Verbalized understanding.

## 2018-07-04 ENCOUNTER — Encounter (HOSPITAL_COMMUNITY): Payer: Managed Care, Other (non HMO)

## 2018-07-05 ENCOUNTER — Ambulatory Visit (HOSPITAL_COMMUNITY)
Admission: RE | Admit: 2018-07-05 | Discharge: 2018-07-05 | Disposition: A | Discharge status: Home / Self Care | Payer: Managed Care, Other (non HMO) | Source: Ambulatory Visit | Attending: Nephrology | Admitting: Nephrology

## 2018-07-05 VITALS — BP 165/89 | HR 80 | Temp 98.6°F | Resp 20 | Ht 69.0 in | Wt 227.0 lb

## 2018-07-05 DIAGNOSIS — N182 Chronic kidney disease, stage 2 (mild): Secondary | ICD-10-CM

## 2018-07-05 DIAGNOSIS — D631 Anemia in chronic kidney disease: Secondary | ICD-10-CM | POA: Insufficient documentation

## 2018-07-05 DIAGNOSIS — N183 Chronic kidney disease, stage 3 (moderate): Secondary | ICD-10-CM | POA: Diagnosis not present

## 2018-07-05 LAB — POCT HEMOGLOBIN-HEMACUE: Hemoglobin: 11.6 g/dL — ABNORMAL LOW (ref 12.0–15.0)

## 2018-07-05 MED ORDER — EPOETIN ALFA 40000 UNIT/ML IJ SOLN
INTRAMUSCULAR | Status: AC
  Administered 2018-07-05: 40000 [IU] via SUBCUTANEOUS
  Filled 2018-07-05: qty 1

## 2018-07-05 MED ORDER — EPOETIN ALFA 40000 UNIT/ML IJ SOLN
40000.0000 [IU] | INTRAMUSCULAR | Status: DC
  Administered 2018-07-05: 40000 [IU] via SUBCUTANEOUS

## 2018-07-11 ENCOUNTER — Encounter: Payer: Self-pay | Admitting: Surgical

## 2018-07-11 ENCOUNTER — Encounter (HOSPITAL_COMMUNITY): Payer: Self-pay | Admitting: Emergency Medicine

## 2018-07-11 ENCOUNTER — Emergency Department (HOSPITAL_COMMUNITY): Payer: Managed Care, Other (non HMO)

## 2018-07-11 ENCOUNTER — Emergency Department (HOSPITAL_COMMUNITY)
Admission: EM | Admit: 2018-07-11 | Discharge: 2018-07-11 | Disposition: A | Discharge status: Home / Self Care | Payer: Managed Care, Other (non HMO) | Attending: Emergency Medicine | Admitting: Emergency Medicine

## 2018-07-11 ENCOUNTER — Encounter: Payer: Self-pay | Admitting: Family Medicine

## 2018-07-11 ENCOUNTER — Ambulatory Visit (INDEPENDENT_AMBULATORY_CARE_PROVIDER_SITE_OTHER): Payer: Managed Care, Other (non HMO) | Admitting: Family Medicine

## 2018-07-11 VITALS — BP 148/86 | HR 83 | Temp 97.9°F | Ht 68.0 in | Wt 230.2 lb

## 2018-07-11 DIAGNOSIS — R519 Headache, unspecified: Secondary | ICD-10-CM

## 2018-07-11 DIAGNOSIS — E1169 Type 2 diabetes mellitus with other specified complication: Secondary | ICD-10-CM

## 2018-07-11 DIAGNOSIS — E1129 Type 2 diabetes mellitus with other diabetic kidney complication: Secondary | ICD-10-CM | POA: Diagnosis not present

## 2018-07-11 DIAGNOSIS — E785 Hyperlipidemia, unspecified: Secondary | ICD-10-CM

## 2018-07-11 DIAGNOSIS — Z794 Long term (current) use of insulin: Secondary | ICD-10-CM | POA: Insufficient documentation

## 2018-07-11 DIAGNOSIS — R51 Headache: Secondary | ICD-10-CM

## 2018-07-11 DIAGNOSIS — E119 Type 2 diabetes mellitus without complications: Secondary | ICD-10-CM | POA: Diagnosis not present

## 2018-07-11 DIAGNOSIS — G43109 Migraine with aura, not intractable, without status migrainosus: Secondary | ICD-10-CM | POA: Diagnosis not present

## 2018-07-11 DIAGNOSIS — E1165 Type 2 diabetes mellitus with hyperglycemia: Secondary | ICD-10-CM

## 2018-07-11 DIAGNOSIS — N185 Chronic kidney disease, stage 5: Secondary | ICD-10-CM | POA: Diagnosis not present

## 2018-07-11 DIAGNOSIS — Z79899 Other long term (current) drug therapy: Secondary | ICD-10-CM | POA: Diagnosis not present

## 2018-07-11 DIAGNOSIS — Z7902 Long term (current) use of antithrombotics/antiplatelets: Secondary | ICD-10-CM | POA: Diagnosis not present

## 2018-07-11 DIAGNOSIS — R42 Dizziness and giddiness: Secondary | ICD-10-CM | POA: Diagnosis present

## 2018-07-11 DIAGNOSIS — IMO0002 Reserved for concepts with insufficient information to code with codable children: Secondary | ICD-10-CM

## 2018-07-11 LAB — CBC
HCT: 38.7 % (ref 36.0–46.0)
Hemoglobin: 11.4 g/dL — ABNORMAL LOW (ref 12.0–15.0)
MCH: 29.4 pg (ref 26.0–34.0)
MCHC: 29.5 g/dL — AB (ref 30.0–36.0)
MCV: 99.7 fL (ref 78.0–100.0)
PLATELETS: 358 10*3/uL (ref 150–400)
RBC: 3.88 MIL/uL (ref 3.87–5.11)
RDW: 14.4 % (ref 11.5–15.5)
WBC: 8.6 10*3/uL (ref 4.0–10.5)

## 2018-07-11 LAB — RAPID URINE DRUG SCREEN, HOSP PERFORMED
Amphetamines: NOT DETECTED
Benzodiazepines: NOT DETECTED
COCAINE: NOT DETECTED
Opiates: NOT DETECTED
Tetrahydrocannabinol: NOT DETECTED

## 2018-07-11 LAB — DIFFERENTIAL
Abs Immature Granulocytes: 0 10*3/uL (ref 0.0–0.1)
BASOS PCT: 0 %
Basophils Absolute: 0 10*3/uL (ref 0.0–0.1)
EOS ABS: 0.2 10*3/uL (ref 0.0–0.7)
EOS PCT: 2 %
Immature Granulocytes: 0 %
LYMPHS ABS: 1.9 10*3/uL (ref 0.7–4.0)
Lymphocytes Relative: 23 %
MONO ABS: 0.7 10*3/uL (ref 0.1–1.0)
MONOS PCT: 8 %
NEUTROS PCT: 67 %
Neutro Abs: 5.7 10*3/uL (ref 1.7–7.7)

## 2018-07-11 LAB — URINALYSIS, ROUTINE W REFLEX MICROSCOPIC
Bilirubin Urine: NEGATIVE
GLUCOSE, UA: 50 mg/dL — AB
HGB URINE DIPSTICK: NEGATIVE
KETONES UR: NEGATIVE mg/dL
LEUKOCYTES UA: NEGATIVE
Nitrite: NEGATIVE
Protein, ur: >=300 mg/dL — AB
Specific Gravity, Urine: 1.008 (ref 1.005–1.030)
pH: 7 (ref 5.0–8.0)

## 2018-07-11 LAB — I-STAT BETA HCG BLOOD, ED (MC, WL, AP ONLY): I-stat hCG, quantitative: <5 m[IU]/mL (ref <5)

## 2018-07-11 LAB — COMPREHENSIVE METABOLIC PANEL
ALT: 19 U/L (ref 0–44)
AST: 18 U/L (ref 15–41)
Albumin: 2.6 g/dL — ABNORMAL LOW (ref 3.5–5.0)
Alkaline Phosphatase: 72 U/L (ref 38–126)
Anion gap: 8 (ref 5–15)
BILIRUBIN TOTAL: 0.7 mg/dL (ref 0.3–1.2)
BUN: 45 mg/dL — AB (ref 6–20)
CO2: 24 mmol/L (ref 22–32)
Calcium: 8 mg/dL — ABNORMAL LOW (ref 8.9–10.3)
Chloride: 106 mmol/L (ref 98–111)
Creatinine, Ser: 6.22 mg/dL — ABNORMAL HIGH (ref 0.44–1.00)
GFR, EST AFRICAN AMERICAN: 8 mL/min — AB (ref >60)
GFR, EST NON AFRICAN AMERICAN: 7 mL/min — AB (ref >60)
Glucose, Bld: 92 mg/dL (ref 70–99)
POTASSIUM: 5.7 mmol/L — AB (ref 3.5–5.1)
Sodium: 138 mmol/L (ref 135–145)
TOTAL PROTEIN: 6.6 g/dL (ref 6.5–8.1)

## 2018-07-11 LAB — PROTIME-INR
INR: 1.01
Prothrombin Time: 13.2 seconds (ref 11.4–15.2)

## 2018-07-11 LAB — APTT: aPTT: 30 seconds (ref 24–36)

## 2018-07-11 LAB — I-STAT TROPONIN, ED: TROPONIN I, POC: 0 ng/mL (ref 0.00–0.08)

## 2018-07-11 MED ORDER — DIPHENHYDRAMINE HCL 50 MG/ML IJ SOLN: 12.5000 mg | Freq: Once | INTRAMUSCULAR | Status: DC

## 2018-07-11 MED ORDER — DIPHENHYDRAMINE HCL 50 MG/ML IJ SOLN
12.5000 mg | Freq: Once | INTRAMUSCULAR | Status: AC
  Administered 2018-07-11: 12.5 mg via INTRAMUSCULAR
  Filled 2018-07-11: qty 1

## 2018-07-11 MED ORDER — AMLODIPINE BESYLATE 5 MG PO TABS
10.0000 mg | ORAL_TABLET | Freq: Every day | ORAL | Status: DC
  Administered 2018-07-11: 10 mg via ORAL
  Filled 2018-07-11: qty 2

## 2018-07-11 MED ORDER — CARVEDILOL 12.5 MG PO TABS: 12.5000 mg | ORAL_TABLET | Freq: Every day | ORAL | Status: DC

## 2018-07-11 MED ORDER — TRAMADOL HCL 50 MG PO TABS: 50.0000 mg | ORAL_TABLET | Freq: Four times a day (QID) | ORAL | 0 refills | Status: DC | PRN

## 2018-07-11 MED ORDER — FENTANYL CITRATE (PF) 100 MCG/2ML IJ SOLN
50.0000 ug | Freq: Once | INTRAMUSCULAR | Status: AC
  Administered 2018-07-11: 50 ug via INTRAVENOUS
  Filled 2018-07-11: qty 2

## 2018-07-11 MED ORDER — PROCHLORPERAZINE EDISYLATE 10 MG/2ML IJ SOLN: 10.0000 mg | Freq: Once | INTRAMUSCULAR | Status: DC

## 2018-07-11 MED ORDER — PROCHLORPERAZINE EDISYLATE 10 MG/2ML IJ SOLN
10.0000 mg | Freq: Once | INTRAMUSCULAR | Status: AC
  Administered 2018-07-11: 10 mg via INTRAMUSCULAR
  Filled 2018-07-11: qty 2

## 2018-07-11 NOTE — ED Provider Notes (Signed)
[facility] EMERGENCY DEPARTMENT Provider Note   CSN: [id number] Arrival date & time: 07/11/18  1521     History   Chief Complaint Chief Complaint  Patient presents with  . Dizziness    HPI Heather [name] is a 47 y.o. female.  HPI Pt presents to the ED  For evaluation of headache and weakness.  Pt has a history of prior stroke.  She noticed Friday night a headache followed by numbness on the right side of her face.  The face numbness has resolved but the headache has not.  Pt also has weakness in her right leg.  She has residual deficits from a prior stroke but she thinks she might be a little weaker today.  [name] not sure.  She denies any trouble with speech.  No trouble with her vision.  No facial weakness.  Pt went to her doctor today for a checkup who encouraged her to come to the hospital.  Past Medical History:  Diagnosis Date  . Allergy   . Anemia of chronic disease   . B12 deficiency 05/10/2014  . Blood transfusion without reported diagnosis   . Bronchitis   . Chronic constipation   . Chronic kidney disease   . Constipation   . CVA (cerebral vascular accident) ([place]) 12/03/2017   nonhemorrhagic infarct-- inferior right cerebellum  . Diabetic neuropathy, painful ([place]) 07/13/2013   Stop lyrica due to cost & SE of drowsy. Gabapentin helps Stay off hydrocodone: pt states she thinks "she was addicted" to med. "I don't want that anymore."   . DM2 (diabetes mellitus, type 2) ([place])   . Dyspnea    with exertion  . Fibromyalgia   . Gastroparesis due to DM   . GERD (gastroesophageal reflux disease)   . Headache   . Heart murmur, systolic 95/05/3874   Grade 1/6 ejection murmur per [facility] notes.   . Hyperlipemia   . Hypertension   . IBS (irritable bowel syndrome)   . Infertility   . Leiomyoma of uterus   . Morbid obesity due to excess calories ([place]) 11/16/2016  . Neuropathy   . Obesity   . Pancreatitis   . Pneumonia   . 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 Right-sided low back pain without sciatica 12/08/2015  . Secondary hyperthyroidism 11/27/2013   Followed by [employer] [employer], notes reviewed 01/01/15. PTH had improved with oral vitamin D: 133 to 85. Last 155-probably not taking vit D.    [name] Upper airway cough syndrome 11/15/2016   rec off acei/ on pepcid hs 11/15/2016 >>>       Past Surgical History:  Procedure Laterality Date  . ARTERY REPAIR Right 02/17/2018   Procedure: EXPLORATION OF RIGHT BRACHIAL ARTERY;  Surgeon: Conrad Lake George, MD;  Location: [facility];  Service: Vascular;  Laterality: Right;  . ARTERY REPAIR Right 02/17/2018   Procedure: BRACHIAL ARTERY EXPLORATION AND TRHOMBECTOMY;  Surgeon: Conrad Atlantic, MD;  Location[facility] OR;  Service: Vascular;  Laterality: Right;  . AV FISTULA PLACEMENT Left 05/09/2017   Procedure: INSERTION OF ARTERIOVENOUS (AV) GRAFT ARM (ARTEGRAFT);  Surgeon: Conrad Matamoras, MD;  Location: [facility] OR;  Service: Vascular;  Laterality: Left;  . AV FISTULA PLACEMENT Right 02/17/2018   Procedure: INSERTION OF ARTERIOVENOUS (AV) GORE-TEX GRAFT ARM RIGHT UPPER ARM;  Surgeon: Conrad Bozeman, MD;  Location: [facility];  Service: Vascular;  Laterality: Right;  . BASCILIC VEIN TRANSPOSITION Left 08/31/2016   Procedure: BASILIC VEIN TRANSPOSITION  FIRST STAGE;  Surgeon: Conrad Troutville, MD;  Location: [facility];  Service: Vascular;  Laterality: Left;  . FOOT SURGERY Right    t"ook bone out- maybe hammer toe"  . LIGATION OF ARTERIOVENOUS  FISTULA Left 05/09/2017   Procedure: LIGATION OF ARTERIOVENOUS  FISTULA;  Surgeon: Conrad Maupin, MD;  Location: [facility];  Service: Vascular;  Laterality: Left;  . LOOP RECORDER INSERTION N/A 12/05/2017   Procedure: LOOP RECORDER INSERTION;  Surgeon: Evans Lance, MD;  Location: [facility] CV LAB;  Service: Cardiovascular;  Laterality: N/A;  . MYOMECTOMY    . REMOVAL OF GRAFT Right 02/17/2018   Procedure: REMOVAL OF RIGHT UPPER ARM ARTERIOVENOUS GRAFT;  Surgeon: Conrad Mannsville, MD;  Location: [facility];  Service: Vascular;  Laterality: Right;  . TEE WITHOUT CARDIOVERSION N/A 12/05/2017   Procedure: TRANSESOPHAGEAL ECHOCARDIOGRAM (TEE);  Surgeon: Sanda Klein, MD;  Location: [facility];  Service: Cardiovascular;  Laterality: N/A;  . VITRECTOMY Bilateral      OB History    Gravida  2   Para  1   Term      Preterm      AB  1   Living  1     SAB  1   TAB      Ectopic      Multiple      Live Births               Home Medications    Prior to Admission medications   Medication Sig Start Date End Date Taking? Authorizing Provider  amLODipine (NORVASC) 10 MG tablet Take 10 mg by mouth at bedtime.   Yes [provider]  calcitRIOL (ROCALTROL) 0.25 MCG capsule Take 0.75 mcg by mouth daily.    Yes [provider]  carvedilol (COREG) 12.5 MG tablet Take 12.5 mg by mouth daily.   Yes [provider]  clopidogrel (PLAVIX) 75 MG tablet Take 1 tablet (75 mg total) by mouth daily. 04/11/18  Yes Briscoe Deutscher, DO  epoetin alfa (EPOGEN,PROCRIT) 06301 UNIT/ML injection Inject 30,000 Units into the vein every 14 (fourteen) days.    Yes [provider]  FLOVENT HFA 220 MCG/ACT inhaler INHALE 2 PUFFS INTO THE LUNGS DAILY. 03/04/18  Yes [provider]  furosemide (LASIX) 80 MG tablet Take 160 mg by mouth daily.   Yes [provider]  gabapentin (NEURONTIN) 300 MG capsule Take 1 capsule (300 mg total) by mouth 2 (two) times daily. Patient taking differently: Take 300 mg by mouth 2 (two) times daily as needed (pain).  04/11/18  Yes Briscoe Deutscher, DO  insulin aspart (NOVOLOG) 100 UNIT/ML injection Inject 6-8 Units into the skin 3 (three) times daily before meals. 8 UNITS WITH BREAKFAST, 6 UNITS WITH LUNCH, & 8 UNITS WITH SUPPER   Yes [provider]  insulin degludec (TRESIBA FLEXTOUCH) 100 UNIT/ML SOPN FlexTouch Pen Inject 28 Units into the skin daily with lunch.    Yes [provider]    simvastatin (ZOCOR) 10 MG tablet Take 0.5 tablets (5 mg total) by mouth at bedtime. 1/2 po q hs - GFR 14 04/11/18  Yes Briscoe Deutscher, DO  lubiprostone (AMITIZA) 24 MCG capsule Take 1 capsule (24 mcg total) by mouth 2 (two) times daily with a meal. Patient not taking: Reported on 07/11/2018 05/31/18   Zehr, Laban Emperor, PA-C  pantoprazole (PROTONIX) 40 MG tablet Take 1 tablet (40 mg total) by mouth daily. Patient not taking: Reported on 07/11/2018 04/10/18  Zehr, Laban Emperor, PA-C  traMADol (ULTRAM) 50 MG tablet Take 1 tablet (50 mg total) by mouth every 6 (six) hours as needed. 07/11/18   Dorie Rank, MD    Family History Family History  Problem Relation Age of Onset  . Colon polyps Mother   . Diabetes Mother   . Heart murmur Father   . Hypertension Father   . Diabetes Sister   . Kidney failure Sister   . Diabetes Brother   . Retinal degeneration Brother   . Heart murmur Son   . Stroke Paternal Grandmother   . Diabetes Sister     Social History Social History   Tobacco Use  . Smoking status: Never Smoker  . Smokeless tobacco: Never Used  Substance Use Topics  . Alcohol use: No    Alcohol/week: 0.0 oz  . Drug use: No     Allergies   Ibuprofen and Dilaudid [hydromorphone hcl]   Review of Systems Review of Systems  Constitutional: Negative for fever.  Eyes: Negative for photophobia and visual disturbance.  Respiratory: Negative for shortness of breath.   Cardiovascular: Negative for chest pain and leg swelling.  All other systems reviewed and are negative.    Physical Exam Updated Vital Signs BP 114/71 (BP Location: Left Arm)   Pulse 76   Temp 98.3 F (36.8 C) (Oral)   Resp 18   LMP 06/11/2018   SpO2 96%   Physical Exam  Constitutional: [name] oriented to person, place, and time. She appears well-developed and well-nourished. No distress.  HENT:  Head: Normocephalic and atraumatic.  Right Ear: External ear normal.  Left Ear: External ear normal.  Mouth/Throat:  Oropharynx is clear and moist.  Eyes: Conjunctivae are normal. Right eye exhibits no discharge. Left eye exhibits no discharge. No scleral icterus.  Neck: Neck supple. No tracheal deviation present.  Cardiovascular: Normal rate, regular rhythm and intact distal pulses.  Pulmonary/Chest: Effort normal and breath sounds normal. No stridor. No respiratory distress. She has no wheezes. She has no rales.  Abdominal: Soft. Bowel sounds are normal. She exhibits no distension. There is no tenderness. There is no rebound and no guarding.  Musculoskeletal: She exhibits no edema or tenderness.  Neurological: [name] alert and oriented to person, place, and time. She has normal strength. No cranial nerve deficit (No facial droop, extraocular movements intact, tongue midline ) or sensory deficit. She exhibits normal muscle tone. She displays no seizure activity. Coordination normal.   pronator right upper extrem, unable to hold right leg off the bed for 5 seconds, sensation intact in all extremities, no visual field cuts, no left or right sided neglect, normal finger-nose exam bilaterally, no nystagmus noted   Skin: Skin is warm and dry. No rash noted.  Psychiatric: She has a normal mood and affect.  Nursing note and vitals reviewed.    ED Treatments / Results  Labs (all labs ordered are listed, but only abnormal results are displayed) Labs Reviewed  CBC - Abnormal; Notable for the following components:      Result Value   Hemoglobin 11.4 (*)    MCHC 29.5 (*)    All other components within normal limits  COMPREHENSIVE METABOLIC PANEL - Abnormal; Notable for the following components:   Potassium 5.7 (*)    BUN 45 (*)    Creatinine, Ser 6.22 (*)    Calcium 8.0 (*)    Albumin 2.6 (*)    GFR calc non Af Amer 7 (*)    GFR  calc Af Amer 8 (*)    All other components within normal limits  RAPID URINE DRUG SCREEN, HOSP PERFORMED - Abnormal; Notable for the following components:   Barbiturates   (*)     Value: Result not available. Reagent lot number recalled by manufacturer.   All other components within normal limits  URINALYSIS, ROUTINE W REFLEX MICROSCOPIC - Abnormal; Notable for the following components:   Color, Urine STRAW (*)    APPearance HAZY (*)    Glucose, UA 50 (*)    Protein, ur >=300 (*)    Bacteria, UA RARE (*)    All other components within normal limits  PROTIME-INR  APTT  DIFFERENTIAL  ETHANOL  I-STAT TROPONIN, ED  I-STAT BETA HCG BLOOD, ED ([facility], [facility], [facility] ONLY)    EKG EKG Interpretation  Date/Time:  Tuesday July 11 2018 15:40:48 EDT Ventricular Rate:  83 PR Interval:    QRS Duration: 94 QT Interval:  374 QTC Calculation: 440 R Axis:   38 Text Interpretation:  Sinus rhythm Low voltage, precordial leads No significant change since last tracing Confirmed by Dorie Rank [phone]) on 07/11/2018 3:48:44 PM   Radiology Ct Head Wo Contrast  Result Date: 07/11/2018 CLINICAL DATA:  Dizziness with facial numbness and headache starting 3 days ago. EXAM: CT HEAD WITHOUT CONTRAST TECHNIQUE: Contiguous axial images were obtained from the base of the skull through the vertex without intravenous contrast. COMPARISON:  MRI 01/12/2018 and head CT 12/02/2017 FINDINGS: Brain: Remote infarct right inferior cerebellar hemisphere with moderate periventricular and subcortical white matter hypodensities consistent with chronic microvascular ischemia. Brainstem is unremarkable. No large vascular territory infarction, hemorrhage, intra-axial mass nor extra-axial fluid collection is identified. No hydrocephalus. Vascular: Moderate carotid siphon atherosclerosis. No hyperdense vessel sign. Skull: No fracture or suspicious osseous lesions. Sinuses/Orbits: Intact without acute abnormality. Other: Clear mastoids bilaterally. IMPRESSION: 1. Remote inferior right cerebellar infarct with encephalomalacia. 2. Chronic moderate small vessel ischemia. No acute intracranial abnormality. Electronically Signed    By: Ashley Royalty M.D.   On: 07/11/2018 18:43   Mr Brain Wo Contrast  Result Date: 07/11/2018 CLINICAL DATA:  Headache for 3 days. LEFT facial numbness for 2 days. History of stroke, diabetes, hypertension, hyperlipidemia. EXAM: MRI HEAD WITHOUT CONTRAST TECHNIQUE: Multiplanar, multiecho pulse sequences of the brain and surrounding structures were obtained without intravenous contrast. COMPARISON:  CT HEAD July 11, 2018 and MRI of the head January 12, 2018. FINDINGS: INTRACRANIAL CONTENTS: No reduced diffusion to suggest acute ischemia or hyperacute demyelination. No susceptibility artifact to suggest hemorrhage. Mild-to-moderate parenchymal brain volume loss. No hydrocephalus. No suspicious parenchymal signal, masses, mass effect. Patchy nonspecific supratentorial and pontine white matter FLAIR T2 hyperintensities. RIGHT inferior cerebellar wedge-like encephalomalacia. No abnormal extra-axial fluid collections. No extra-axial masses. VASCULAR: Normal major intracranial vascular flow voids present at skull base. SKULL AND UPPER CERVICAL SPINE: No abnormal sellar expansion. Heterogeneously decreased bone marrow signal seen with anemia craniocervical junction maintained. SINUSES/ORBITS: LEFT mastoid effusion.The included ocular globes and orbital contents are non-suspicious. Status post bilateral ocular lens implants. OTHER: None. IMPRESSION: 1. No acute intracranial process. 2. 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 3. Moderate white matter changes most compatible chronic small vessel ischemic disease. 4. Mild-to-moderate parenchymal brain volume loss, advanced for age. Electronically Signed   By: Elon Alas M.D.   On: 07/11/2018 22:34    Procedures Procedures (including critical care time) IV placed by me in the right IJ.  Flushed easily.    Medications Ordered in ED Medications  amLODipine (  NORVASC) tablet 10 mg (0 mg Oral Hold 07/11/18 2104)  carvedilol (COREG) tablet 12.5 mg (0 mg  Oral Hold 07/11/18 1926)  prochlorperazine (COMPAZINE) injection 10 mg (10 mg Intramuscular Given 07/11/18 1731)  diphenhydrAMINE (BENADRYL) injection 12.5 mg (12.5 mg Intramuscular Given 07/11/18 0800)  fentaNYL (SUBLIMAZE) injection 50 mcg (50 mcg Intravenous Given 07/11/18 1851)     Initial Impression / Assessment and Plan / ED Course  I have reviewed the triage vital signs and the nursing notes.  Pertinent labs & imaging results that were available during my care of the patient were reviewed by me and considered in my medical decision making (see chart for details).  Clinical Course as of Jul 11 2318  Tue Jul 11, 2018  1716 Unable to establish IV access.  IM meds given.     [JK]  2318 BUN and creatinine elevated consistent with her chronic kidney disease.  Urinalysis without signs of infection.  Blood tests otherwise unremarkable.   [JK]    Clinical Course User Index [JK] Dorie Rank, MD   Patient presented to the emergency room for evaluation of headache associated with numbness and weakness.  Patient was concerned that she has a history of cerebellar stroke.  CT scan was without any acute abnormalities.  Because of her symptoms and concerns recurrent stroke MRI was performed.  MRI was fortunately negative.  I suspect her symptoms are related to a migraine headache.  Patient improved with treatment and she is ready for discharge.  Final Clinical Impressions(s) / ED Diagnoses   Final diagnoses:  Migraine with aura and without status migrainosus, not intractable    ED Discharge Orders        Ordered    traMADol (ULTRAM) 50 MG tablet  Every 6 hours PRN     07/11/18 2316       Dorie Rank, MD 07/11/18 2319

## 2018-07-11 NOTE — ED Triage Notes (Signed)
Pt here from MD office with c/o dizziness and facial numbness with h/a , started 3 days ago , history of stroke

## 2018-07-11 NOTE — Discharge Instructions (Addendum)
Continue your current medications, follow-up with your primary care doctor

## 2018-07-11 NOTE — ED Notes (Signed)
Pt transported to CT ?

## 2018-07-11 NOTE — Progress Notes (Signed)
Patient in for follow up visit today but admits to a severe right sided headache x 3 days, with numbness and drooping of left face x 2 days but better today. Took BP at time HA started and SBP > 180. Now normalized. Headache still present. Hx of recent stroke, compliant with Plavix. Admits that she did not go to the hospital because she did not want to be admitted at that time. PMHx: DMII/CKD 5, HLD, previous CVA, HTN, HLD. Based on Hx of risk factors, will send to ED via EMS for new stroke work-up. Note: Patient has upcoming appointment with Dr. [name] for vascular access.   Briscoe Deutscher, DO

## 2018-07-11 NOTE — Progress Notes (Signed)
Heather [name] is a 47 y.o. female is here for follow up.  History of Present Illness:  Shaune Pascal CMA acting as scribe for Dr. Juleen China.  HPI: Patient comes in today for a 2 month follow up.   Headache: Patient states that she has had a headache for three days now. Patient states that she has had numbness on the left side of her face two of the three days. Lips are still numb. The pain is on the right side of the head and face. Denies any blurred or double vision. Patient stated that she felt like her finger tips was numb one day. She let her husband know the symptoms and he was watching her. Patient has had a stroke in the past. Patient blood pressure during the start of the headache was 170/96. Patient was afraid to go to the hospital because the she was afraid they was going to keep her. We will send the patient to ED for further work up. I will call EMS to transport patient.   Health Maintenance Due  Topic Date Due  . OPHTHALMOLOGY EXAM  08/05/2017  . MAMMOGRAM  06/01/2018  . FOOT EXAM  06/30/2018   Depression screen [facility] 2/9 01/02/2018 12/08/2017 06/30/2017  Decreased Interest 1 1 0  Down, Depressed, Hopeless 0 3 0  PHQ - 2 Score 1 4 0  Altered sleeping 3 3 -  Tired, decreased energy 3 2 -  Change in appetite 1 3 -  Feeling bad or failure about yourself  0 2 -  Trouble concentrating 0 1 -  Moving slowly or fidgety/restless 1 1 -  Suicidal thoughts 0 1 -  PHQ-9 Score 9 17 -  Some recent data might be hidden   PMHx, SurgHx, SocialHx, FamHx, Medications, and Allergies were reviewed in the Visit Navigator and updated as appropriate.   Patient Active Problem List   Diagnosis Date Noted  . Chronic kidney disease (CKD), active medical management without dialysis 06/14/2018  . Thromboembolism ([place]) 04/05/2018  . Steal syndrome dialysis vascular access ([place]) 02/17/2018  . Cyst of left ovary 01/12/2018  . Dysmenorrhea 01/12/2018  . Insomnia 01/12/2018  . Menorrhagia  01/12/2018  . Vitamin D deficiency 01/02/2018  . Bilateral lower extremity edema 01/02/2018  . Reactive depression 12/10/2017  . Left-sided weakness 12/10/2017  . Stroke (cerebrum) ([place]) 12/02/2017  . Dysfunction of left eustachian tube 11/24/2017  . Arthralgia of right temporomandibular joint 10/24/2017  . Antiplatelet or antithrombotic long-term use: Plavix 06/30/2017  . Abdominal pain, epigastric 11/25/2016  . Morbid obesity due to excess calories ([place]) 11/16/2016  . Upper airway cough syndrome 11/15/2016  . DM (diabetes mellitus), type 2, uncontrolled, with renal complications ([place]) 57/32/2025  . Dyslipidemia associated with type 2 diabetes mellitus ([place]) 06/25/2016  . Uncontrolled type 2 diabetes mellitus with both eyes affected by proliferative retinopathy and macular edema, with long-term current use of insulin ([place]) 06/22/2016  . Uncontrolled type 2 diabetes mellitus with diabetic polyneuropathy, with long-term current use of insulin ([place]) 03/08/2016  . IDDM (insulin dependent diabetes mellitus) ([place]) 03/08/2016  . Right-sided low back pain without sciatica 12/08/2015  . Proliferative diabetic retinopathy ([place]) 09/29/2015  . Constipation 09/03/2015  . Pseudophakia of both eyes 05/30/2015  . 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 B12 deficiency 05/10/2014  . Anemia in chronic kidney disease 04/26/2014  . CKD (chronic kidney disease) stage 5, GFR less than 15 ml/min (H[facility]) 04/26/2014  . Heart murmur, systolic 42/70/6237  . Secondary hyperparathyroidism (  [place]) 11/27/2013  . Posterior subcapsular cataract, bilateral 10/18/2013  . Diabetic neuropathy, painful ([place]) 07/13/2013  . Diabetes mellitus, type II, insulin dependent ([place]), on CGM, followed by Endocrinology, with end organ damage: renal, neuropathy, gastroparesis, retinopathy 02/19/2011  . Essential hypertension 02/19/2011  . Gastroparesis due to DM ([place]) 02/19/2011   Social History   Tobacco Use  . Smoking status:  Never Smoker  . Smokeless tobacco: Never Used  Substance Use Topics  . Alcohol use: No    Alcohol/week: 0.0 oz  . Drug use: No   Current Medications and Allergies:   Current Outpatient Medications:  .  albuterol (PROVENTIL HFA;VENTOLIN HFA) 108 (90 Base) MCG/ACT inhaler, Inhale 1 puff into the lungs 4 (four) times daily., Disp: , Rfl:  .  amLODipine (NORVASC) 10 MG tablet, Take 10 mg by mouth at bedtime., Disp: , Rfl:  .  Blood Glucose Monitoring Suppl ([place]) w/Device KIT, by Does not apply route 4 (four) times daily., Disp: , Rfl:  .  calcitRIOL (ROCALTROL) 0.25 MCG capsule, Take 0.75 mcg by mouth daily. , Disp: , Rfl:  .  carvedilol (COREG) 12.5 MG tablet, Take 12.5 mg by mouth daily., Disp: , Rfl:  .  clopidogrel (PLAVIX) 75 MG tablet, Take 1 tablet (75 mg total) by mouth daily., Disp: 90 tablet, Rfl: 3 .  Continuous Blood Gluc Receiver (FREESTYLE LIBRE 53 DAY READER) DEVI, See admin instructions., Disp: , Rfl: 0 .  epoetin alfa (EPOGEN,PROCRIT) 12248 UNIT/ML injection, Inject 30,000 Units into the vein every 14 (fourteen) days. , Disp: , Rfl:  .  FLOVENT HFA 220 MCG/ACT inhaler, INHALE 2 PUFFS INTO THE LUNGS DAILY., Disp: , Rfl: 12 .  furosemide (LASIX) 80 MG tablet, Take 160 mg by mouth daily., Disp: , Rfl:  .  gabapentin (NEURONTIN) 300 MG capsule, Take 1 capsule (300 mg total) by mouth 2 (two) times daily., Disp: 180 capsule, Rfl: 1 .  glucose blood (ONETOUCH VERIO) test strip, 4 (four) times daily., Disp: , Rfl:  .  insulin aspart (NOVOLOG) 100 UNIT/ML injection, Inject 6-8 Units into the skin 3 (three) times daily before meals. 8 UNITS WITH BREAKFAST, 6 UNITS WITH LUNCH, & 8 UNITS WITH SUPPER, Disp: , Rfl:  .  insulin degludec (TRESIBA FLEXTOUCH) 100 UNIT/ML SOPN FlexTouch Pen, Inject 28 Units into the skin daily with lunch. , Disp: , Rfl:  .  lubiprostone (AMITIZA) 24 MCG capsule, Take 1 capsule (24 mcg total) by mouth 2 (two) times daily with a meal., Disp: 60  capsule, Rfl: 5 .  pantoprazole (PROTONIX) 40 MG tablet, Take 1 tablet (40 mg total) by mouth daily., Disp: 30 tablet, Rfl: 5 .  simvastatin (ZOCOR) 10 MG tablet, Take 0.5 tablets (5 mg total) by mouth at bedtime. 1/2 po q hs - GFR 14, Disp: 45 tablet, Rfl: 1  Current Facility-Administered Medications:  .  0.9 %  sodium chloride infusion, 500 mL, Intravenous, Once, Armbruster, Carlota Raspberry, MD   Allergies  Allergen Reactions  . Ibuprofen Other (See Comments)    CKD stage 3. Should avoid.  . Dilaudid [Hydromorphone Hcl] Itching and Other (See Comments)    Can take with Benadryl.   Review of Systems   Pertinent items are noted in the HPI. Otherwise, ROS is negative.  Vitals:   Vitals:   07/11/18 1334  BP: (!) 148/86  Pulse: 83  Temp: 97.9 F (36.6 C)  TempSrc: Oral  SpO2: 98%  Weight: 230 lb 3.2 oz (104.4 kg)  Height:  _0  (1.727 m)     Body mass index is 35 kg/m.  Physical Exam:   Physical Exam  Constitutional: [name] oriented to person, place, and time. She appears well-developed and well-nourished.  HENT:  Head: Normocephalic and atraumatic.  Cardiovascular: Normal rate, regular rhythm and intact distal pulses.  Pulmonary/Chest: Effort normal.  Abdominal: Soft.  Neurological: [name] alert and oriented to person, place, and time.  Psychiatric: She has a normal mood and affect.  Nursing note and vitals reviewed.  Results for orders placed or performed during the hospital encounter of 07/05/18  Hemoglobin-hemacue, POC  Result Value Ref Range   Hemoglobin 11.6 (L) 12.0 - 15.0 g/dL    Assessment and Plan:   Peg[place] was seen today for follow-up and headache.  Diagnoses and all orders for this visit:  Chronic kidney disease with active medical management without dialysis, stage 5 ([place])  DM (diabetes mellitus), type 2, uncontrolled, with renal complications ([place])  Dyslipidemia associated with type 2 diabetes mellitus ([place])  Antiplatelet or antithrombotic  long-term use: Plavix  Acute nonintractable headache, unspecified headache type    . Reviewed expectations re: course of current medical issues. . Discussed self-management of symptoms. . Outlined signs and symptoms indicating need for more acute intervention. . Patient verbalized understanding and all questions were answered. [name] Health Maintenance issues including appropriate healthy diet, exercise, and smoking avoidance were discussed with patient. . See orders for this visit as documented in the electronic medical record. . Patient received an After Visit Summary.  Briscoe Deutscher, DO [place] [place] 07/11/2018  Future Appointments  Date Time Provider [place]  07/18/2018  1:00 PM [facility] DEVICE REMOTES [facility]  07/19/2018  2:30 PM [facility] INJECTION ROOM [facility] None  08/17/2018  3:00 PM Dohmeier, Asencion Partridge, MD [facility] None  09/05/2018  1:40 PM Briscoe Deutscher, DO [facility]    CMA served as scribe during this visit. History, Physical, and Plan performed by medical provider. The above documentation has been reviewed and is accurate and complete. Briscoe Deutscher, D.O.

## 2018-07-13 ENCOUNTER — Encounter (HOSPITAL_COMMUNITY): Payer: Self-pay | Admitting: Vascular Surgery

## 2018-07-13 ENCOUNTER — Other Ambulatory Visit: Payer: Self-pay

## 2018-07-13 ENCOUNTER — Encounter (HOSPITAL_COMMUNITY)
Admission: RE | Disposition: A | Discharge status: Home / Self Care | Payer: Self-pay | Source: Ambulatory Visit | Attending: Vascular Surgery

## 2018-07-13 ENCOUNTER — Ambulatory Visit (HOSPITAL_COMMUNITY)
Admission: RE | Admit: 2018-07-13 | Discharge: 2018-07-13 | Disposition: A | Discharge status: Home / Self Care | Payer: Managed Care, Other (non HMO) | Source: Ambulatory Visit | Attending: Vascular Surgery | Admitting: Vascular Surgery

## 2018-07-13 ENCOUNTER — Other Ambulatory Visit: Payer: Self-pay | Admitting: *Deleted

## 2018-07-13 DIAGNOSIS — Z7902 Long term (current) use of antithrombotics/antiplatelets: Secondary | ICD-10-CM | POA: Diagnosis not present

## 2018-07-13 DIAGNOSIS — K219 Gastro-esophageal reflux disease without esophagitis: Secondary | ICD-10-CM | POA: Diagnosis not present

## 2018-07-13 DIAGNOSIS — Z6836 Body mass index (BMI) 36.0-36.9, adult: Secondary | ICD-10-CM | POA: Insufficient documentation

## 2018-07-13 DIAGNOSIS — K581 Irritable bowel syndrome with constipation: Secondary | ICD-10-CM | POA: Insufficient documentation

## 2018-07-13 DIAGNOSIS — E538 Deficiency of other specified B group vitamins: Secondary | ICD-10-CM | POA: Diagnosis not present

## 2018-07-13 DIAGNOSIS — E0590 Thyrotoxicosis, unspecified without thyrotoxic crisis or storm: Secondary | ICD-10-CM | POA: Diagnosis not present

## 2018-07-13 DIAGNOSIS — M797 Fibromyalgia: Secondary | ICD-10-CM | POA: Diagnosis not present

## 2018-07-13 DIAGNOSIS — Z794 Long term (current) use of insulin: Secondary | ICD-10-CM | POA: Insufficient documentation

## 2018-07-13 DIAGNOSIS — I120 Hypertensive chronic kidney disease with stage 5 chronic kidney disease or end stage renal disease: Secondary | ICD-10-CM | POA: Insufficient documentation

## 2018-07-13 DIAGNOSIS — E785 Hyperlipidemia, unspecified: Secondary | ICD-10-CM | POA: Insufficient documentation

## 2018-07-13 DIAGNOSIS — K3184 Gastroparesis: Secondary | ICD-10-CM | POA: Insufficient documentation

## 2018-07-13 DIAGNOSIS — N186 End stage renal disease: Secondary | ICD-10-CM | POA: Diagnosis not present

## 2018-07-13 DIAGNOSIS — E1143 Type 2 diabetes mellitus with diabetic autonomic (poly)neuropathy: Secondary | ICD-10-CM | POA: Diagnosis not present

## 2018-07-13 DIAGNOSIS — N185 Chronic kidney disease, stage 5: Secondary | ICD-10-CM | POA: Diagnosis not present

## 2018-07-13 DIAGNOSIS — E1122 Type 2 diabetes mellitus with diabetic chronic kidney disease: Secondary | ICD-10-CM | POA: Diagnosis not present

## 2018-07-13 DIAGNOSIS — N189 Chronic kidney disease, unspecified: Secondary | ICD-10-CM | POA: Diagnosis present

## 2018-07-13 HISTORY — PX: UPPER EXTREMITY VENOGRAPHY: CATH118272

## 2018-07-13 LAB — POCT I-STAT, CHEM 8
BUN: 51 mg/dL — AB (ref 6–20)
CHLORIDE: 109 mmol/L (ref 98–111)
CREATININE: 7.1 mg/dL — AB (ref 0.44–1.00)
Calcium, Ion: 1.02 mmol/L — ABNORMAL LOW (ref 1.15–1.40)
GLUCOSE: 195 mg/dL — AB (ref 70–99)
HCT: 39 % (ref 36.0–46.0)
Hemoglobin: 13.3 g/dL (ref 12.0–15.0)
Potassium: 4.9 mmol/L (ref 3.5–5.1)
Sodium: 139 mmol/L (ref 135–145)
TCO2: 23 mmol/L (ref 22–32)

## 2018-07-13 LAB — PREGNANCY, URINE: Preg Test, Ur: NEGATIVE

## 2018-07-13 SURGERY — UPPER EXTREMITY VENOGRAPHY
Anesthesia: LOCAL | Laterality: Left

## 2018-07-13 MED ORDER — SODIUM CHLORIDE 0.9 % IV SOLN: 250.0000 mL | INTRAVENOUS | Status: DC | PRN

## 2018-07-13 MED ORDER — SODIUM CHLORIDE 0.9% FLUSH: 3.0000 mL | INTRAVENOUS | Status: DC | PRN

## 2018-07-13 MED ORDER — DIPHENHYDRAMINE HCL 50 MG/ML IJ SOLN
INTRAMUSCULAR | Status: AC
  Filled 2018-07-13: qty 1

## 2018-07-13 MED ORDER — LABETALOL HCL 5 MG/ML IV SOLN: 10.0000 mg | INTRAVENOUS | Status: DC | PRN

## 2018-07-13 MED ORDER — ACETAMINOPHEN 325 MG PO TABS: 650.0000 mg | ORAL_TABLET | ORAL | Status: DC | PRN

## 2018-07-13 MED ORDER — SODIUM CHLORIDE 0.9 % IV SOLN
INTRAVENOUS | Status: DC
  Administered 2018-07-13: 07:00:00 via INTRAVENOUS

## 2018-07-13 MED ORDER — SODIUM CHLORIDE 0.9% FLUSH: 3.0000 mL | Freq: Two times a day (BID) | INTRAVENOUS | Status: DC

## 2018-07-13 MED ORDER — HYDRALAZINE HCL 20 MG/ML IJ SOLN: 5.0000 mg | INTRAMUSCULAR | Status: DC | PRN

## 2018-07-13 MED ORDER — HEPARIN (PORCINE) IN NACL 1000-0.9 UT/500ML-% IV SOLN
INTRAVENOUS | Status: DC | PRN
  Administered 2018-07-13: 500 mL

## 2018-07-13 MED ORDER — DIPHENHYDRAMINE HCL 50 MG/ML IJ SOLN
25.0000 mg | Freq: Once | INTRAMUSCULAR | Status: AC
  Administered 2018-07-13: 25 mg via INTRAVENOUS

## 2018-07-13 MED ORDER — ONDANSETRON HCL 4 MG/2ML IJ SOLN
4.0000 mg | Freq: Four times a day (QID) | INTRAMUSCULAR | Status: DC | PRN
Start: 2018-07-13 — End: 2018-07-13

## 2018-07-13 MED ORDER — IODIXANOL 320 MG/ML IV SOLN
INTRAVENOUS | Status: DC | PRN
  Administered 2018-07-13: 10 mL via INTRAVENOUS

## 2018-07-13 SURGICAL SUPPLY — 2 items
STOPCOCK MORSE 400PSI 3WAY (MISCELLANEOUS) ×2 IMPLANT
TUBING CIL FLEX 10 FLL-RA (TUBING) ×2 IMPLANT

## 2018-07-13 NOTE — Interval H&P Note (Signed)
History and Physical Interval Note:  07/13/2018 7:05 AM  Heather [name]  has presented today for surgery, with the diagnosis of kidney disease stage 5  The various methods of treatment have been discussed with the patient and family. After consideration of risks, benefits and other options for treatment, the patient has consented to  Procedure(s): UPPER EXTREMITY VENOGRAPHY - Central & Left Arm (N/A) as a surgical intervention .  The patient's history has been reviewed, patient examined, no change in status, stable for surgery.  I have reviewed the patient's chart and labs.  Questions were answered to the patient's satisfaction.     Adele Barthel

## 2018-07-13 NOTE — Progress Notes (Signed)
Call to patient tinstructed to hold Plavix for 5 days pre-op. To arrive at [facility] admitting at 5:30 am on 07/21/18 for procedure. NPO past MN night prior and must have driver for home. Expect a call and follow the detailed instructions received from the hospital pre-admission department about this surgery. Verbalized understanding.

## 2018-07-13 NOTE — Op Note (Addendum)
    OPERATIVE NOTE   PROCEDURE: 1.  left arm and central venogram   PRE-OPERATIVE DIAGNOSIS: end stage renal disease  POST-OPERATIVE DIAGNOSIS: same as above   SURGEON: Adele Barthel, MD  ANESTHESIA: local  ESTIMATED BLOOD LOSS: 5 cc  FINDING(S): 1.  Patent central venous structures 2.  Likely deep patent left axillary vein 3.  Patent proximal basilic and brachial veins reconstitute axillary vein  SPECIMEN(S):  None  CONTRAST: 10 cc  INDICATIONS: Heather [name] is a 48 y.o. female who presents with end stage renal disease.  The patient is scheduled for left arm and central venogram to help determine the availability of proximal veins for permanent access placement.  The patient is aware the risks include but are not limited to: bleeding, infection, thrombosis of the cannulated access, and possible anaphylactic reaction to the contrast.  The patient is aware of the risks of the procedure and elects to proceed forward.   DESCRIPTION: After full informed written consent was obtained, the patient was brought back to the angiography suite and placed supine upon the angiography table.  The patient was connected to monitoring equipment.  The left forearm IV was connected to IV extension tubing.  Hand injections were completed to image the arm veins and central venous structures, the findings of which are listed above.  Based on the images, a redo left upper arm arteriovenous graft is possible.  Will schedule her for such.   COMPLICATIONS: none  CONDITION: stable   Adele Barthel, MD, [facility] [place] Office: [phone] Pager: [phone]  07/13/2018 7:37 AM

## 2018-07-13 NOTE — Discharge Instructions (Signed)
**Note Taytum Wheller-identified via Obfuscation** Venogram, Care After °This sheet gives you information about how to care for yourself after your procedure. Your health care provider may also give you more specific instructions. If you have problems or questions, contact your health care provider. °What can I expect after the procedure? °After the procedure, it is common to have: °· Bruising or mild discomfort in the area where the IV was inserted (insertion site). ° °Follow these instructions at home: °Eating and drinking °· Follow instructions from your health care provider about eating or drinking restrictions. °· Drink a lot of fluids for the first several days after the procedure, as directed by your health care provider. This helps to wash (flush) the contrast out of your body. Examples of healthy fluids include water or low-calorie drinks. °General instructions °· Check your IV insertion area every day for signs of infection. Check for: °? Redness, swelling, or pain. °? Fluid or blood. °? Warmth. °? Pus or a bad smell. °· Take over-the-counter and prescription medicines only as told by your health care provider. °· Rest and return to your normal activities as told by your health care provider. Ask your health care provider what activities are safe for you. °· Do not drive for 24 hours if you were given a medicine to help you relax (sedative), or until your health care provider approves. °· Keep all follow-up visits as told by your health care provider. This is important. °Contact a health care provider if: °· Your skin becomes itchy or you develop a rash or hives. °· You have a fever that does not get better with medicine. °· You feel nauseous. °· You vomit. °· You have redness, swelling, or pain around the insertion site. °· You have fluid or blood coming from the insertion site. °· Your insertion area feels warm to the touch. °· You have pus or a bad smell coming from the insertion site. °Get help right away if: °· You have difficulty breathing or  shortness of breath. °· You develop chest pain. °· You faint. °· You feel very dizzy. °These symptoms may represent a serious problem that is an emergency. Do not wait to see if the symptoms will go away. Get medical help right away. Call your local emergency services (911 in the [place] Do not drive yourself to the hospital. °Summary °· After your procedure, it is common to have bruising or mild discomfort in the area where the IV was inserted. °· You should check your IV insertion area every day for signs of infection. °· Take over-the-counter and prescription medicines only as told by your health care provider. °· You should drink a lot of fluids for the first several days after the procedure to help flush the contrast from your body. °This information is not intended to replace advice given to you by your health care provider. Make sure you discuss any questions you have with your health care provider. °Document Released: 10/03/2013 Document Revised: 11/06/2016 Document Reviewed: 11/06/2016 °Elsevier Interactive Patient Education © 2017 [employer] ° °

## 2018-07-13 NOTE — Progress Notes (Addendum)
**Note Karma Hiney-Identified via Obfuscation** Patient post upper extremity venography. Patient arrived to unit complaining of generalized itching. Dr. [name] notified and order given for 25 mg Benadryl via IV once. Patient responded well to medication and stated she is no longer itching. No complaints of SOB, chest pain or dizziness. Patient discharged home and all personal items sent with patient. Patient escorted to main entrance in wheelchair and accompanied by mother. Fall/safety precautions implemented.

## 2018-07-18 ENCOUNTER — Ambulatory Visit (INDEPENDENT_AMBULATORY_CARE_PROVIDER_SITE_OTHER): Payer: Managed Care, Other (non HMO) | Admitting: *Deleted

## 2018-07-18 DIAGNOSIS — I639 Cerebral infarction, unspecified: Secondary | ICD-10-CM

## 2018-07-19 ENCOUNTER — Inpatient Hospital Stay (HOSPITAL_COMMUNITY)
Admission: RE | Admit: 2018-07-19 | Discharge: 2018-07-19 | Disposition: A | Discharge status: Home / Self Care | Payer: Managed Care, Other (non HMO) | Source: Ambulatory Visit | Attending: Nephrology | Admitting: Nephrology

## 2018-07-19 NOTE — Progress Notes (Signed)
[facility] Summary Report / Loop Recorder 

## 2018-07-20 ENCOUNTER — Other Ambulatory Visit: Payer: Self-pay

## 2018-07-20 ENCOUNTER — Encounter (HOSPITAL_COMMUNITY): Payer: Self-pay | Admitting: *Deleted

## 2018-07-20 LAB — CUP PACEART REMOTE DEVICE CHECK
Implantable Pulse Generator Implant Date: 20181210
MDC IDC SESS DTM: 20190620223600

## 2018-07-20 NOTE — Progress Notes (Signed)
Pt denies any acute cardiopulmonary issues. Pt under the care of Dr. Lovena Le, Cardiology. Pt denies having a cardiac cath. Pt denies having a chest x ray within the last year.  Pt stated that last dose of Plavix was " last week "  Pt made aware to check BG every 2 hours prior to arrival to hospital on DOS. Pt made aware to treat a BG < 70 with 4 ounces of apple juice, wait 15 minutes after intervention to recheck BG, if BG remains < 70, call Short Stay unit to speak with a nurse. Pt made aware to take 1/2 dose of correction Novolog insulin for BG > 220. Pt verbalized understanding of all pre-op instructions.

## 2018-07-20 NOTE — Progress Notes (Signed)
Pt made aware stop taking vitamins, fish oil and herbal medications. Do not take any NSAIDs ie: Ibuprofen, Advil, Naproxen (Aleve), Motrin, BC and Goody Powder. Pt verbalized understanding of all pre-op instructions.

## 2018-07-21 ENCOUNTER — Inpatient Hospital Stay (HOSPITAL_COMMUNITY): Payer: Managed Care, Other (non HMO)

## 2018-07-21 ENCOUNTER — Other Ambulatory Visit: Payer: Self-pay

## 2018-07-21 ENCOUNTER — Inpatient Hospital Stay (HOSPITAL_COMMUNITY)
Admission: AD | Admit: 2018-07-21 | Discharge: 2018-07-26 | DRG: 628 | Disposition: A | Discharge status: Home / Self Care | Payer: Managed Care, Other (non HMO) | Source: Ambulatory Visit | Attending: Family Medicine | Admitting: Family Medicine

## 2018-07-21 ENCOUNTER — Inpatient Hospital Stay: Payer: Self-pay

## 2018-07-21 ENCOUNTER — Encounter (HOSPITAL_COMMUNITY)
Admission: AD | Disposition: A | Discharge status: Home / Self Care | Payer: Self-pay | Source: Ambulatory Visit | Attending: Nephrology

## 2018-07-21 ENCOUNTER — Ambulatory Visit (HOSPITAL_COMMUNITY): Payer: Managed Care, Other (non HMO) | Admitting: Certified Registered"

## 2018-07-21 ENCOUNTER — Encounter (HOSPITAL_COMMUNITY): Payer: Self-pay | Admitting: *Deleted

## 2018-07-21 DIAGNOSIS — E1143 Type 2 diabetes mellitus with diabetic autonomic (poly)neuropathy: Secondary | ICD-10-CM | POA: Diagnosis not present

## 2018-07-21 DIAGNOSIS — M797 Fibromyalgia: Secondary | ICD-10-CM | POA: Diagnosis present

## 2018-07-21 DIAGNOSIS — Z8371 Family history of colonic polyps: Secondary | ICD-10-CM

## 2018-07-21 DIAGNOSIS — Z9119 Patient's noncompliance with other medical treatment and regimen: Secondary | ICD-10-CM

## 2018-07-21 DIAGNOSIS — Z539 Procedure and treatment not carried out, unspecified reason: Secondary | ICD-10-CM | POA: Diagnosis present

## 2018-07-21 DIAGNOSIS — Z79891 Long term (current) use of opiate analgesic: Secondary | ICD-10-CM

## 2018-07-21 DIAGNOSIS — Z8673 Personal history of transient ischemic attack (TIA), and cerebral infarction without residual deficits: Secondary | ICD-10-CM

## 2018-07-21 DIAGNOSIS — T829XXA Unspecified complication of cardiac and vascular prosthetic device, implant and graft, initial encounter: Secondary | ICD-10-CM | POA: Diagnosis present

## 2018-07-21 DIAGNOSIS — Y832 Surgical operation with anastomosis, bypass or graft as the cause of abnormal reaction of the patient, or of later complication, without mention of misadventure at the time of the procedure: Secondary | ICD-10-CM | POA: Diagnosis present

## 2018-07-21 DIAGNOSIS — N185 Chronic kidney disease, stage 5: Secondary | ICD-10-CM | POA: Diagnosis present

## 2018-07-21 DIAGNOSIS — K3184 Gastroparesis: Secondary | ICD-10-CM | POA: Diagnosis present

## 2018-07-21 DIAGNOSIS — R011 Cardiac murmur, unspecified: Secondary | ICD-10-CM | POA: Diagnosis present

## 2018-07-21 DIAGNOSIS — I10 Essential (primary) hypertension: Secondary | ICD-10-CM | POA: Diagnosis not present

## 2018-07-21 DIAGNOSIS — K589 Irritable bowel syndrome without diarrhea: Secondary | ICD-10-CM | POA: Diagnosis present

## 2018-07-21 DIAGNOSIS — E119 Type 2 diabetes mellitus without complications: Secondary | ICD-10-CM | POA: Diagnosis not present

## 2018-07-21 DIAGNOSIS — Z794 Long term (current) use of insulin: Secondary | ICD-10-CM | POA: Diagnosis not present

## 2018-07-21 DIAGNOSIS — E113599 Type 2 diabetes mellitus with proliferative diabetic retinopathy without macular edema, unspecified eye: Secondary | ICD-10-CM | POA: Diagnosis present

## 2018-07-21 DIAGNOSIS — Z888 Allergy status to other drugs, medicaments and biological substances status: Secondary | ICD-10-CM | POA: Diagnosis not present

## 2018-07-21 DIAGNOSIS — G629 Polyneuropathy, unspecified: Secondary | ICD-10-CM | POA: Insufficient documentation

## 2018-07-21 DIAGNOSIS — Z885 Allergy status to narcotic agent status: Secondary | ICD-10-CM | POA: Diagnosis not present

## 2018-07-21 DIAGNOSIS — R809 Proteinuria, unspecified: Secondary | ICD-10-CM | POA: Insufficient documentation

## 2018-07-21 DIAGNOSIS — I152 Hypertension secondary to endocrine disorders: Secondary | ICD-10-CM | POA: Diagnosis present

## 2018-07-21 DIAGNOSIS — E538 Deficiency of other specified B group vitamins: Secondary | ICD-10-CM | POA: Diagnosis present

## 2018-07-21 DIAGNOSIS — Z6834 Body mass index (BMI) 34.0-34.9, adult: Secondary | ICD-10-CM

## 2018-07-21 DIAGNOSIS — E1140 Type 2 diabetes mellitus with diabetic neuropathy, unspecified: Secondary | ICD-10-CM | POA: Diagnosis present

## 2018-07-21 DIAGNOSIS — E083599 Diabetes mellitus due to underlying condition with proliferative diabetic retinopathy without macular edema, unspecified eye: Secondary | ICD-10-CM | POA: Diagnosis not present

## 2018-07-21 DIAGNOSIS — I519 Heart disease, unspecified: Secondary | ICD-10-CM | POA: Diagnosis not present

## 2018-07-21 DIAGNOSIS — N186 End stage renal disease: Secondary | ICD-10-CM | POA: Diagnosis present

## 2018-07-21 DIAGNOSIS — I770 Arteriovenous fistula, acquired: Secondary | ICD-10-CM | POA: Insufficient documentation

## 2018-07-21 DIAGNOSIS — Z833 Family history of diabetes mellitus: Secondary | ICD-10-CM | POA: Diagnosis not present

## 2018-07-21 DIAGNOSIS — E785 Hyperlipidemia, unspecified: Secondary | ICD-10-CM | POA: Diagnosis present

## 2018-07-21 DIAGNOSIS — Z7902 Long term (current) use of antithrombotics/antiplatelets: Secondary | ICD-10-CM

## 2018-07-21 DIAGNOSIS — Z8249 Family history of ischemic heart disease and other diseases of the circulatory system: Secondary | ICD-10-CM

## 2018-07-21 DIAGNOSIS — Z841 Family history of disorders of kidney and ureter: Secondary | ICD-10-CM

## 2018-07-21 DIAGNOSIS — Z992 Dependence on renal dialysis: Secondary | ICD-10-CM | POA: Diagnosis not present

## 2018-07-21 DIAGNOSIS — K5909 Other constipation: Secondary | ICD-10-CM | POA: Diagnosis present

## 2018-07-21 DIAGNOSIS — E0580 Other thyrotoxicosis without thyrotoxic crisis or storm: Secondary | ICD-10-CM | POA: Diagnosis present

## 2018-07-21 DIAGNOSIS — I5189 Other ill-defined heart diseases: Secondary | ICD-10-CM | POA: Diagnosis present

## 2018-07-21 DIAGNOSIS — E875 Hyperkalemia: Principal | ICD-10-CM | POA: Diagnosis present

## 2018-07-21 DIAGNOSIS — I132 Hypertensive heart and chronic kidney disease with heart failure and with stage 5 chronic kidney disease, or end stage renal disease: Secondary | ICD-10-CM | POA: Diagnosis present

## 2018-07-21 DIAGNOSIS — N2581 Secondary hyperparathyroidism of renal origin: Secondary | ICD-10-CM | POA: Diagnosis present

## 2018-07-21 DIAGNOSIS — K219 Gastro-esophageal reflux disease without esophagitis: Secondary | ICD-10-CM | POA: Diagnosis present

## 2018-07-21 DIAGNOSIS — R200 Anesthesia of skin: Secondary | ICD-10-CM | POA: Diagnosis not present

## 2018-07-21 DIAGNOSIS — Z823 Family history of stroke: Secondary | ICD-10-CM

## 2018-07-21 DIAGNOSIS — Z79899 Other long term (current) drug therapy: Secondary | ICD-10-CM

## 2018-07-21 DIAGNOSIS — E1122 Type 2 diabetes mellitus with diabetic chronic kidney disease: Secondary | ICD-10-CM | POA: Diagnosis present

## 2018-07-21 DIAGNOSIS — Z1211 Encounter for screening for malignant neoplasm of colon: Secondary | ICD-10-CM

## 2018-07-21 DIAGNOSIS — Z419 Encounter for procedure for purposes other than remedying health state, unspecified: Secondary | ICD-10-CM

## 2018-07-21 DIAGNOSIS — I5032 Chronic diastolic (congestive) heart failure: Secondary | ICD-10-CM | POA: Diagnosis present

## 2018-07-21 DIAGNOSIS — D631 Anemia in chronic kidney disease: Secondary | ICD-10-CM | POA: Diagnosis present

## 2018-07-21 DIAGNOSIS — E1159 Type 2 diabetes mellitus with other circulatory complications: Secondary | ICD-10-CM | POA: Diagnosis present

## 2018-07-21 DIAGNOSIS — E8770 Fluid overload, unspecified: Secondary | ICD-10-CM | POA: Diagnosis present

## 2018-07-21 HISTORY — PX: IR US GUIDE VASC ACCESS RIGHT: IMG2390

## 2018-07-21 HISTORY — PX: IR FLUORO GUIDE CV LINE RIGHT: IMG2283

## 2018-07-21 LAB — RENAL FUNCTION PANEL
ANION GAP: 10 (ref 5–15)
Albumin: 2.7 g/dL — ABNORMAL LOW (ref 3.5–5.0)
BUN: 53 mg/dL — ABNORMAL HIGH (ref 6–20)
CHLORIDE: 111 mmol/L (ref 98–111)
CO2: 16 mmol/L — AB (ref 22–32)
Calcium: 8.1 mg/dL — ABNORMAL LOW (ref 8.9–10.3)
Creatinine, Ser: 6.13 mg/dL — ABNORMAL HIGH (ref 0.44–1.00)
GFR calc non Af Amer: 7 mL/min — ABNORMAL LOW (ref >60)
GFR, EST AFRICAN AMERICAN: 8 mL/min — AB (ref >60)
GLUCOSE: 200 mg/dL — AB (ref 70–99)
Phosphorus: 4.8 mg/dL — ABNORMAL HIGH (ref 2.5–4.6)
Potassium: 6.7 mmol/L (ref 3.5–5.1)
Sodium: 137 mmol/L (ref 135–145)

## 2018-07-21 LAB — CBC
HCT: 35.1 % — ABNORMAL LOW (ref 36.0–46.0)
HEMOGLOBIN: 10.7 g/dL — AB (ref 12.0–15.0)
MCH: 29.6 pg (ref 26.0–34.0)
MCHC: 30.5 g/dL (ref 30.0–36.0)
MCV: 97.2 fL (ref 78.0–100.0)
Platelets: 194 10*3/uL (ref 150–400)
RBC: 3.61 MIL/uL — ABNORMAL LOW (ref 3.87–5.11)
RDW: 14.2 % (ref 11.5–15.5)
WBC: 8.1 10*3/uL (ref 4.0–10.5)

## 2018-07-21 LAB — POCT I-STAT 4, (NA,K, GLUC, HGB,HCT)
GLUCOSE: 197 mg/dL — AB (ref 70–99)
HEMATOCRIT: 38 % (ref 36.0–46.0)
Hemoglobin: 12.9 g/dL (ref 12.0–15.0)
Potassium: 6.6 mmol/L (ref 3.5–5.1)
Sodium: 138 mmol/L (ref 135–145)

## 2018-07-21 LAB — POCT I-STAT, CHEM 8
BUN: 49 mg/dL — AB (ref 6–20)
CALCIUM ION: 1.15 mmol/L (ref 1.15–1.40)
CHLORIDE: 109 mmol/L (ref 98–111)
Creatinine, Ser: 6.7 mg/dL — ABNORMAL HIGH (ref 0.44–1.00)
Glucose, Bld: 185 mg/dL — ABNORMAL HIGH (ref 70–99)
HEMATOCRIT: 38 % (ref 36.0–46.0)
Hemoglobin: 12.9 g/dL (ref 12.0–15.0)
POTASSIUM: 6.3 mmol/L — AB (ref 3.5–5.1)
SODIUM: 137 mmol/L (ref 135–145)
TCO2: 22 mmol/L (ref 22–32)

## 2018-07-21 LAB — CREATININE, SERUM
Creatinine, Ser: 6.27 mg/dL — ABNORMAL HIGH (ref 0.44–1.00)
GFR calc non Af Amer: 7 mL/min — ABNORMAL LOW (ref >60)
GFR, EST AFRICAN AMERICAN: 8 mL/min — AB (ref >60)

## 2018-07-21 LAB — GLUCOSE, CAPILLARY
GLUCOSE-CAPILLARY: 147 mg/dL — AB (ref 70–99)
GLUCOSE-CAPILLARY: 99 mg/dL (ref 70–99)
Glucose-Capillary: 136 mg/dL — ABNORMAL HIGH (ref 70–99)
Glucose-Capillary: 185 mg/dL — ABNORMAL HIGH (ref 70–99)
Glucose-Capillary: 104 mg/dL — ABNORMAL HIGH (ref 70–99)

## 2018-07-21 LAB — POCT PREGNANCY, URINE: PREG TEST UR: NEGATIVE

## 2018-07-21 LAB — ALT: ALT: 19 U/L (ref 0–44)

## 2018-07-21 LAB — HCG, SERUM, QUALITATIVE: Preg, Serum: NEGATIVE

## 2018-07-21 SURGERY — CANCELLED PROCEDURE
Laterality: Left

## 2018-07-21 MED ORDER — LIDOCAINE 2% (20 MG/ML) 5 ML SYRINGE
INTRAMUSCULAR | Status: AC
  Filled 2018-07-21: qty 5

## 2018-07-21 MED ORDER — ONDANSETRON HCL 4 MG/2ML IJ SOLN
INTRAMUSCULAR | Status: DC | PRN
  Administered 2018-07-21: 4 mg via INTRAVENOUS

## 2018-07-21 MED ORDER — LIDOCAINE HCL (PF) 1 % IJ SOLN
INTRAMUSCULAR | Status: DC | PRN
  Administered 2018-07-21: 5 mL

## 2018-07-21 MED ORDER — GABAPENTIN 300 MG PO CAPS
300.0000 mg | ORAL_CAPSULE | Freq: Two times a day (BID) | ORAL | Status: DC | PRN
  Administered 2018-07-21 – 2018-07-26 (×5): 300 mg via ORAL
  Filled 2018-07-21 (×5): qty 1

## 2018-07-21 MED ORDER — LIDOCAINE HCL 1 % IJ SOLN
INTRAMUSCULAR | Status: AC
  Filled 2018-07-21: qty 20

## 2018-07-21 MED ORDER — SIMVASTATIN 10 MG PO TABS
5.0000 mg | ORAL_TABLET | Freq: Every day | ORAL | Status: DC
  Administered 2018-07-21 – 2018-07-25 (×5): 5 mg via ORAL
  Filled 2018-07-21 (×5): qty 1

## 2018-07-21 MED ORDER — ONDANSETRON HCL 4 MG/2ML IJ SOLN
INTRAMUSCULAR | Status: AC
  Filled 2018-07-21: qty 2

## 2018-07-21 MED ORDER — INSULIN GLARGINE 100 UNIT/ML ~~LOC~~ SOLN
28.0000 [IU] | Freq: Every day | SUBCUTANEOUS | Status: DC
  Administered 2018-07-21 – 2018-07-26 (×5): 28 [IU] via SUBCUTANEOUS
  Filled 2018-07-21 (×6): qty 0.28

## 2018-07-21 MED ORDER — LIDOCAINE HCL (PF) 1 % IJ SOLN
INTRAMUSCULAR | Status: AC
  Filled 2018-07-21: qty 30

## 2018-07-21 MED ORDER — INSULIN ASPART 100 UNIT/ML ~~LOC~~ SOLN
0.0000 [IU] | Freq: Three times a day (TID) | SUBCUTANEOUS | Status: DC
  Administered 2018-07-21: 1 [IU] via SUBCUTANEOUS
  Administered 2018-07-22 – 2018-07-24 (×3): 2 [IU] via SUBCUTANEOUS
  Administered 2018-07-25: 1 [IU] via SUBCUTANEOUS
  Administered 2018-07-25 – 2018-07-26 (×3): 2 [IU] via SUBCUTANEOUS

## 2018-07-21 MED ORDER — CEFAZOLIN SODIUM-DEXTROSE 2-4 GM/100ML-% IV SOLN
2.0000 g | INTRAVENOUS | Status: DC
  Filled 2018-07-21: qty 100

## 2018-07-21 MED ORDER — HEPARIN SODIUM (PORCINE) 5000 UNIT/ML IJ SOLN
5000.0000 [IU] | Freq: Three times a day (TID) | INTRAMUSCULAR | Status: DC
  Administered 2018-07-21 – 2018-07-26 (×8): 5000 [IU] via SUBCUTANEOUS
  Filled 2018-07-21 (×12): qty 1

## 2018-07-21 MED ORDER — TRAMADOL HCL 50 MG PO TABS
50.0000 mg | ORAL_TABLET | Freq: Four times a day (QID) | ORAL | Status: DC | PRN
  Administered 2018-07-21: 50 mg via ORAL
  Filled 2018-07-21: qty 1

## 2018-07-21 MED ORDER — LIDOCAINE-PRILOCAINE 2.5-2.5 % EX CREA
1.0000 "application " | TOPICAL_CREAM | CUTANEOUS | Status: DC | PRN
  Filled 2018-07-21: qty 5

## 2018-07-21 MED ORDER — CHLORHEXIDINE GLUCONATE CLOTH 2 % EX PADS: 6.0000 | MEDICATED_PAD | Freq: Every day | CUTANEOUS | Status: DC

## 2018-07-21 MED ORDER — FENTANYL CITRATE (PF) 250 MCG/5ML IJ SOLN
INTRAMUSCULAR | Status: AC
  Filled 2018-07-21: qty 5

## 2018-07-21 MED ORDER — CARVEDILOL 12.5 MG PO TABS
12.5000 mg | ORAL_TABLET | Freq: Every day | ORAL | Status: DC
  Administered 2018-07-22 – 2018-07-26 (×5): 12.5 mg via ORAL
  Filled 2018-07-21 (×5): qty 1

## 2018-07-21 MED ORDER — SODIUM CHLORIDE 0.9 % IV SOLN
INTRAVENOUS | Status: DC | PRN
  Administered 2018-07-21: 500 mL

## 2018-07-21 MED ORDER — MIDAZOLAM HCL 2 MG/2ML IJ SOLN
INTRAMUSCULAR | Status: AC
  Filled 2018-07-21: qty 2

## 2018-07-21 MED ORDER — SODIUM CHLORIDE 0.9 % IV SOLN
1.0000 g | INTRAVENOUS | Status: DC
  Filled 2018-07-21: qty 10

## 2018-07-21 MED ORDER — DEXTROSE 50 % IV SOLN
25.0000 g | INTRAVENOUS | Status: AC
  Administered 2018-07-21: 25 g via INTRAVENOUS
  Filled 2018-07-21: qty 50

## 2018-07-21 MED ORDER — ALTEPLASE 2 MG IJ SOLR: 2.0000 mg | Freq: Once | INTRAMUSCULAR | Status: DC | PRN

## 2018-07-21 MED ORDER — ONDANSETRON HCL 4 MG/2ML IJ SOLN
4.0000 mg | Freq: Four times a day (QID) | INTRAMUSCULAR | Status: DC | PRN
  Administered 2018-07-23 – 2018-07-25 (×2): 4 mg via INTRAVENOUS
  Filled 2018-07-21 (×3): qty 2

## 2018-07-21 MED ORDER — INSULIN ASPART 100 UNIT/ML ~~LOC~~ SOLN: 6.0000 [IU] | Freq: Three times a day (TID) | SUBCUTANEOUS | Status: DC

## 2018-07-21 MED ORDER — ONDANSETRON HCL 4 MG PO TABS: 4.0000 mg | ORAL_TABLET | Freq: Four times a day (QID) | ORAL | Status: DC | PRN

## 2018-07-21 MED ORDER — SODIUM CHLORIDE 0.9 % IV SOLN: 100.0000 mL | INTRAVENOUS | Status: DC | PRN

## 2018-07-21 MED ORDER — CHLORHEXIDINE GLUCONATE 4 % EX LIQD: 60.0000 mL | Freq: Once | CUTANEOUS | Status: DC

## 2018-07-21 MED ORDER — SODIUM CHLORIDE 0.9 % IV SOLN
INTRAVENOUS | Status: AC
  Filled 2018-07-21: qty 1.2

## 2018-07-21 MED ORDER — INSULIN ASPART 100 UNIT/ML IV SOLN
10.0000 [IU] | INTRAVENOUS | Status: AC
  Administered 2018-07-21: 10 [IU] via INTRAVENOUS

## 2018-07-21 MED ORDER — SODIUM POLYSTYRENE SULFONATE 15 GM/60ML PO SUSP
30.0000 g | ORAL | Status: DC
  Administered 2018-07-21 (×2): 30 g via ORAL
  Filled 2018-07-21 (×3): qty 120

## 2018-07-21 MED ORDER — SODIUM CHLORIDE 0.9 % IV SOLN: 500.0000 mL | Freq: Once | INTRAVENOUS | Status: DC

## 2018-07-21 MED ORDER — LUBIPROSTONE 24 MCG PO CAPS: 24.0000 ug | ORAL_CAPSULE | Freq: Two times a day (BID) | ORAL | Status: DC

## 2018-07-21 MED ORDER — PENTAFLUOROPROP-TETRAFLUOROETH EX AERO: 1.0000 "application " | INHALATION_SPRAY | CUTANEOUS | Status: DC | PRN

## 2018-07-21 MED ORDER — INSULIN ASPART 100 UNIT/ML IV SOLN: 10.0000 [IU] | Freq: Once | INTRAVENOUS | Status: DC

## 2018-07-21 MED ORDER — TRAMADOL HCL 50 MG PO TABS: 50.0000 mg | ORAL_TABLET | Freq: Four times a day (QID) | ORAL | Status: DC | PRN

## 2018-07-21 MED ORDER — ACETAMINOPHEN 650 MG RE SUPP: 650.0000 mg | Freq: Four times a day (QID) | RECTAL | Status: DC | PRN

## 2018-07-21 MED ORDER — DEXTROSE 50 % IV SOLN: 25.0000 g | Freq: Once | INTRAVENOUS | Status: DC

## 2018-07-21 MED ORDER — DIPHENHYDRAMINE HCL 50 MG/ML IJ SOLN
25.0000 mg | Freq: Four times a day (QID) | INTRAMUSCULAR | Status: DC | PRN
  Administered 2018-07-21 – 2018-07-22 (×2): 25 mg via INTRAVENOUS
  Filled 2018-07-21 (×2): qty 1

## 2018-07-21 MED ORDER — AMLODIPINE BESYLATE 10 MG PO TABS
10.0000 mg | ORAL_TABLET | Freq: Every day | ORAL | Status: DC
  Administered 2018-07-21 – 2018-07-25 (×5): 10 mg via ORAL
  Filled 2018-07-21 (×5): qty 1

## 2018-07-21 MED ORDER — LIDOCAINE HCL (PF) 1 % IJ SOLN: 5.0000 mL | INTRAMUSCULAR | Status: DC | PRN

## 2018-07-21 MED ORDER — BUDESONIDE 0.5 MG/2ML IN SUSP: 0.5000 mg | Freq: Two times a day (BID) | RESPIRATORY_TRACT | Status: DC

## 2018-07-21 MED ORDER — PROPOFOL 10 MG/ML IV BOLUS
INTRAVENOUS | Status: AC
  Filled 2018-07-21: qty 20

## 2018-07-21 MED ORDER — CALCITRIOL 0.5 MCG PO CAPS
0.7500 ug | ORAL_CAPSULE | Freq: Every day | ORAL | Status: DC
  Administered 2018-07-21 – 2018-07-26 (×5): 0.75 ug via ORAL
  Filled 2018-07-21 (×5): qty 1

## 2018-07-21 MED ORDER — CLOPIDOGREL BISULFATE 75 MG PO TABS
75.0000 mg | ORAL_TABLET | Freq: Every day | ORAL | Status: DC
  Administered 2018-07-23 – 2018-07-26 (×3): 75 mg via ORAL
  Filled 2018-07-21 (×3): qty 1

## 2018-07-21 MED ORDER — HEPARIN SODIUM (PORCINE) 1000 UNIT/ML DIALYSIS
1000.0000 [IU] | INTRAMUSCULAR | Status: DC | PRN
  Administered 2018-07-21: 1000 [IU] via INTRAVENOUS_CENTRAL
  Filled 2018-07-21 (×3): qty 1

## 2018-07-21 MED ORDER — CALCIUM CHLORIDE 10 % IV SOLN
1.0000 g | INTRAVENOUS | Status: DC
  Filled 2018-07-21: qty 10

## 2018-07-21 MED ORDER — HEPARIN SODIUM (PORCINE) 1000 UNIT/ML IJ SOLN
INTRAMUSCULAR | Status: AC
  Filled 2018-07-21: qty 1

## 2018-07-21 MED ORDER — ZOLPIDEM TARTRATE 5 MG PO TABS
5.0000 mg | ORAL_TABLET | Freq: Every evening | ORAL | Status: DC | PRN
  Administered 2018-07-23: 5 mg via ORAL
  Filled 2018-07-21: qty 1

## 2018-07-21 MED ORDER — SODIUM CHLORIDE 0.9 % IV SOLN: 1.0000 g | Freq: Once | INTRAVENOUS | Status: DC

## 2018-07-21 MED ORDER — SODIUM CHLORIDE 0.9 % IV SOLN
1.0000 g | Freq: Once | INTRAVENOUS | Status: AC
  Administered 2018-07-21: 1 g via INTRAVENOUS
  Filled 2018-07-21: qty 10

## 2018-07-21 MED ORDER — SODIUM CHLORIDE 0.9 % IV SOLN
INTRAVENOUS | Status: DC
  Administered 2018-07-21: 07:00:00 via INTRAVENOUS
  Administered 2018-07-22: 10 mL/h via INTRAVENOUS
  Administered 2018-07-24 (×2): via INTRAVENOUS

## 2018-07-21 MED ORDER — KETOROLAC TROMETHAMINE 15 MG/ML IJ SOLN: 15.0000 mg | Freq: Four times a day (QID) | INTRAMUSCULAR | Status: AC | PRN

## 2018-07-21 MED ORDER — 0.9 % SODIUM CHLORIDE (POUR BTL) OPTIME
TOPICAL | Status: DC | PRN
  Administered 2018-07-21: 1000 mL

## 2018-07-21 MED ORDER — ACETAMINOPHEN 325 MG PO TABS
650.0000 mg | ORAL_TABLET | Freq: Four times a day (QID) | ORAL | Status: DC | PRN
  Administered 2018-07-23: 650 mg via ORAL
  Filled 2018-07-21: qty 2

## 2018-07-21 NOTE — Progress Notes (Signed)
Pt down to IR for temp HD cath, pt has KVO infusing via right hand due to difficult iv stick, phlebotomy at bedside and due to difficulty in getting labs , pt to get HD cath and will ask HD to pull labs when she is in HD today

## 2018-07-21 NOTE — Progress Notes (Signed)
Pt c/o itching all over " I cant take anything but tylenol or I itch like this" requesting benedryl, paged Dr Evangeline Gula for request

## 2018-07-21 NOTE — Care Management (Signed)
Med Rec pending pharmacy review

## 2018-07-21 NOTE — Progress Notes (Signed)
HD tx initiated via HD cath w/o problem, bilat ports: pull/push/flush equally w/o problem, VSS, will cont to monitor while on HD tx

## 2018-07-21 NOTE — Procedures (Signed)
Interventional Radiology Procedure Note  Procedure: Non-tunneled HD catheter placement  Complications: None  Estimated Blood Loss: < 10 mL  Findings: Right IJ triple lumen, 20 cm Mahurker catheter placed with tip at SVC/RA junction.  OK to use.  Venetia Night. Kathlene Cote, M.D Pager:  [phone]

## 2018-07-21 NOTE — H&P (Signed)
Brief History and Physical  History of Present Illness   Heather [name] is a 47 y.o. female who presents with chief complaint: CKD stage V.  The patient presents today for attempt at new access.  L arm venogram suggests redo L UA AVG might be possible.  Past Medical History:  Diagnosis Date  . Allergy   . Anemia of chronic disease   . B12 deficiency 05/10/2014  . Blood transfusion without reported diagnosis   . Bronchitis   . Chronic constipation   . Chronic kidney disease   . Constipation   . CVA (cerebral vascular accident) ([place]) 12/03/2017   nonhemorrhagic infarct-- inferior right cerebellum  . Diabetic neuropathy, painful ([place]) 07/13/2013   Stop lyrica due to cost & SE of drowsy. Gabapentin helps Stay off hydrocodone: pt states she thinks "she was addicted" to med. "I don't want that anymore."   . DM2 (diabetes mellitus, type 2) ([place])   . Dyspnea    with exertion  . Fibromyalgia   . Gastroparesis due to DM   . GERD (gastroesophageal reflux disease)   . Headache   . Heart murmur, systolic 83/02/8249   Grade 1/6 ejection murmur per [facility] notes.   . Hyperlipemia   . Hypertension   . IBS (irritable bowel syndrome)   . Infertility   . Leiomyoma of uterus   . Morbid obesity due to excess calories ([place]) 11/16/2016  . Neuropathy   . Obesity   . Pancreatitis   . Pneumonia   . 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 Right-sided low back pain without sciatica 12/08/2015  . Secondary hyperthyroidism 11/27/2013   Followed by [facility], notes reviewed 01/01/15. PTH had improved with oral vitamin D: 133 to 85. Last 155-probably not taking vit D.    [name] Upper airway cough syndrome 11/15/2016   rec off acei/ on pepcid hs 11/15/2016 >>>     Past Surgical History:  Procedure Laterality Date  . ARTERY REPAIR Right 02/17/2018   Procedure: EXPLORATION OF RIGHT BRACHIAL ARTERY;  Surgeon: Conrad [place], MD;  Location: [place];  Service: Vascular;   Laterality: Right;  . ARTERY REPAIR Right 02/17/2018   Procedure: BRACHIAL ARTERY EXPLORATION AND TRHOMBECTOMY;  Surgeon: Conrad [place], MD;  Location: [facility] OR;  Service: Vascular;  Laterality: Right;  . AV FISTULA PLACEMENT Left 05/09/2017   Procedure: INSERTION OF ARTERIOVENOUS (AV) GRAFT ARM (ARTEGRAFT);  Surgeon: Conrad [place], MD;  Location: [facility] OR;  Service: Vascular;  Laterality: Left;  . AV FISTULA PLACEMENT Right 02/17/2018   Procedure: INSERTION OF ARTERIOVENOUS (AV) GORE-TEX GRAFT ARM RIGHT UPPER ARM;  Surgeon: Conrad Brayton, MD;  Location: [place];  Service: Vascular;  Laterality: Right;  . BASCILIC VEIN TRANSPOSITION Left 08/31/2016   Procedure: BASILIC VEIN TRANSPOSITION FIRST STAGE;  Surgeon: Conrad [place], MD;  Location: [place];  Service: Vascular;  Laterality: Left;  . FOOT SURGERY Right    t"ook bone out- maybe hammer toe"  . LIGATION OF ARTERIOVENOUS  FISTULA Left 05/09/2017   Procedure: LIGATION OF ARTERIOVENOUS  FISTULA;  Surgeon: Conrad George, MD;  Location: [place] [place];  Service: Vascular;  Laterality: Left;  . LOOP RECORDER INSERTION N/A 12/05/2017   Procedure: LOOP RECORDER INSERTION;  Surgeon: Evans Lance, MD;  Location: [place] CV LAB;  Service: Cardiovascular;  Laterality: N/A;  . MYOMECTOMY    . REMOVAL OF GRAFT Right 02/17/2018   Procedure: REMOVAL OF RIGHT UPPER ARM ARTERIOVENOUS GRAFT;  Surgeon: Conrad [place], MD;  Location: [place];  Service: Vascular;  Laterality: Right;  . TEE WITHOUT CARDIOVERSION N/A 12/05/2017   Procedure: TRANSESOPHAGEAL ECHOCARDIOGRAM (TEE);  Surgeon: Sanda Klein, MD;  Location: [facility] ENDOSCOPY;  Service: Cardiovascular;  Laterality: N/A;  . UPPER EXTREMITY VENOGRAPHY Left 07/13/2018   Procedure: UPPER EXTREMITY VENOGRAPHY - Central & Left Arm;  Surgeon: Conrad [place], MD;  Location: [place] CV LAB;  Service: Cardiovascular;  Laterality: Left;  [name] VITRECTOMY Bilateral     Social History   Socioeconomic History  . Marital status: Married     Spouse name: Not on file  . Number of children: 1  . Years of education: college  . Highest education level: Not on file  Occupational History  . Occupation: Armed forces technical officer: [employer]  Social Needs  . Financial resource strain: Not on file  . Food insecurity:    Worry: Not on file    Inability: Not on file  . Transportation needs:    Medical: Not on file    Non-medical: Not on file  Tobacco Use  . Smoking status: Never Smoker  . Smokeless tobacco: Never Used  Substance and Sexual Activity  . Alcohol use: No    Alcohol/week: 0.0 oz  . Drug use: No  . Sexual activity: Yes    Partners: Male    Birth control/protection: None  Lifestyle  . Physical activity:    Days per week: Not on file    Minutes per session: Not on file  . Stress: Not on file  Relationships  . Social connections:    Talks on phone: Not on file    Gets together: Not on file    Attends religious service: Not on file    Active member of club or organization: Not on file    Attends meetings of clubs or organizations: Not on file    Relationship status: Not on file  . Intimate partner violence:    Fear of current or ex partner: Not on file    Emotionally abused: Not on file    Physically abused: Not on file    Forced sexual activity: Not on file  Other Topics Concern  . Not on file  Social History Narrative   Patient lives with fiance. She has 1 grown son. Works full-time, she Paramedic for attorney.   Caffeine Use: 2 cups daily; sodas occasionally    Family History  Problem Relation Age of Onset  . Colon polyps Mother   . Diabetes Mother   . Heart murmur Father   . Hypertension Father   . Diabetes Sister   . Kidney failure Sister   . Diabetes Brother   . Retinal degeneration Brother   . Heart murmur Son   . Stroke Paternal Grandmother   . Diabetes Sister     Current Facility-Administered Medications  Medication Dose Route Frequency Provider Last Rate Last Dose   . 0.9 %  sodium chloride infusion   Intravenous Continuous Conrad [place], MD      . ceFAZolin (ANCEF) IVPB 2g/100 mL premix  2 g Intravenous 30 min Pre-Op Conrad [place], MD      . chlorhexidine (HIBICLENS) 4 % liquid 4 application  60 mL Topical Once Conrad [place], MD       And  . Derrill Memo ON 07/22/2018] chlorhexidine (HIBICLENS) 4 % liquid 4 application  60 mL Topical Once Conrad [place], MD  Allergies  Allergen Reactions  . Ibuprofen Other (See Comments)    CKD stage 3. Should avoid.  . Dilaudid [Hydromorphone Hcl] Itching and Other (See Comments)    Can take with Benadryl.    Review of Systems: As listed above, otherwise negative.   Physical Examination   Vitals:   07/21/18 0602 07/21/18 0722  BP: (!) 157/80   Pulse: 80   Resp: 18   Temp: 97.7 F (36.5 C)   TempSrc: Oral   SpO2: 100%   Weight:  227 lb (103 kg)  Height:  5\' 8"  (1.727 m)   Body mass index is 34.52 kg/m.  General Alert, O x 3, WD, NAD  Pulmonary Sym exp, good B air movt, CTA B  Cardiac RRR, Nl S1, S2, no Murmurs, No rubs, No S3,S4  Musculo- skeletal R arm incision healed, L arm incision healed  Neurologic Pain and light touch intact in extremities,     Laboratory  See iStat   Medical Decision Making   Heather [name] is a 47 y.o. female who presents with: chronic kidney disease stage V .   The patient is scheduled for: redo LUA AVG  Risk, benefits, and alternatives to access surgery were discussed.  The patient is aware the risks include but are not limited to: bleeding, infection, steal syndrome, nerve damage, ischemic monomelic neuropathy, failure to mature, and need for additional procedures.  The patient is aware of the risks and agrees to proceed.   Adele Barthel, MD, FACS [facility] [place] Office: [phone] Pager: [phone]  07/21/2018, 7:24 AM

## 2018-07-21 NOTE — H&P (Signed)
History and Physical    Heather [name] DDU:[id number] DOB: 08-16-1971 DOA: 07/21/2018  PCP: Briscoe Deutscher, DO  Patient coming from: Home  I have personally briefly reviewed patient's old medical records in [place]  Chief Complaint: Patient presented for vascular access and now found to have hyperkalemia unable to proceed with surgery.  HPI: Heather [name] is a 48 y.o. 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  She was seen by Dr. [name] and labs were ordered.  Her potassium was noted to be 6.4 on repeat it was 6.6 and therefore surgery was canceled.  Patient cannot undergo anesthesia with a potassium that high.  Dr. [name] from vascular spoke with Dr. Justin Mend who recommended admission to the hospital for normalization of potassium and likely hemodialysis.  It is scheduled for a time to be placed today by interventional radiology and then to proceed to surgery.  She states she has not been feeling well over the past few days but could not really characterize it.  She has had no increase from her baseline shortness of breath.  She denies any chest pain, nausea, vomiting, or headache or blurry vision.  She has had no cough or sputum production.  She denies fevers chills rashes or masses.   Review of Systems: As per HPI otherwise all other systems reviewed and  negative.    Past Medical History:  Diagnosis Date  . Allergy   . Anemia of chronic disease   . B12 deficiency 05/10/2014  . Blood transfusion without reported diagnosis   . Bronchitis   . Chronic constipation   . Chronic kidney disease   . Constipation   . CVA (cerebral  vascular accident) ([place]) 12/03/2017   nonhemorrhagic infarct-- inferior right cerebellum  . Diabetic neuropathy, painful ([place]) 07/13/2013   Stop lyrica due to cost & SE of drowsy. Gabapentin helps Stay off hydrocodone: pt states she thinks "she was addicted" to med. "I don't want that anymore."   . DM2 (diabetes mellitus, type 2) ([place])   . Dyspnea    with exertion  . Fibromyalgia   . Gastroparesis due to DM   . GERD (gastroesophageal reflux disease)   . Headache   . Heart murmur, systolic 23/06/6282   Grade 1/6 ejection murmur per [facility] notes.   . Hyperlipemia   . Hypertension   . IBS (irritable bowel syndrome)   . Infertility   . Leiomyoma of uterus   . Morbid obesity due to excess calories ([place]) 11/16/2016  . Neuropathy   . Obesity   . Pancreatitis   . Pneumonia   . 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 Right-sided low back pain without sciatica 12/08/2015  . Secondary hyperthyroidism 11/27/2013   Followed by [facility], notes reviewed 01/01/15. PTH had improved with oral vitamin D: 133 to 85. Last 155-probably not taking vit D.    [name] Upper airway cough syndrome 11/15/2016   rec off acei/ on pepcid hs 11/15/2016 >>>     Past Surgical History:  Procedure Laterality Date  . ARTERY REPAIR Right 02/17/2018   Procedure: EXPLORATION OF RIGHT BRACHIAL ARTERY;  Surgeon: Conrad [place], MD;  Location: [place];  Service: Vascular;  Laterality: Right;  . ARTERY REPAIR  Right 02/17/2018   Procedure: BRACHIAL ARTERY EXPLORATION AND TRHOMBECTOMY;  Surgeon: Conrad [place], MD;  Location: [facility] OR;  Service: Vascular;  Laterality: Right;  . AV FISTULA PLACEMENT Left 05/09/2017   Procedure: INSERTION OF ARTERIOVENOUS (AV) GRAFT ARM (ARTEGRAFT);  Surgeon: Conrad Clawson, MD;  Location: [facility] OR;  Service: Vascular;  Laterality: Left;  . AV FISTULA PLACEMENT Right 02/17/2018   Procedure: INSERTION OF ARTERIOVENOUS (AV) GORE-TEX GRAFT ARM RIGHT UPPER ARM;  Surgeon: Conrad [place],  MD;  Location: [place] [place];  Service: Vascular;  Laterality: Right;  . BASCILIC VEIN TRANSPOSITION Left 08/31/2016   Procedure: BASILIC VEIN TRANSPOSITION FIRST STAGE;  Surgeon: Conrad [place], MD;  Location: [place];  Service: Vascular;  Laterality: Left;  . FOOT SURGERY Right    t"ook bone out- maybe hammer toe"  . LIGATION OF ARTERIOVENOUS  FISTULA Left 05/09/2017   Procedure: LIGATION OF ARTERIOVENOUS  FISTULA;  Surgeon: Conrad [place]red, MD;  Location: [place];  Service: Vascular;  Laterality: Left;  . LOOP RECORDER INSERTION N/A 12/05/2017   Procedure: LOOP RECORDER INSERTION;  Surgeon: Evans Lance, MD;  Location: [place] CV LAB;  Service: Cardiovascular;  Laterality: N/A;  . MYOMECTOMY    . REMOVAL OF GRAFT Right 02/17/2018   Procedure: REMOVAL OF RIGHT UPPER ARM ARTERIOVENOUS GRAFT;  Surgeon: Conrad [place], MD;  Location: [place];  Service: Vascular;  Laterality: Right;  . TEE WITHOUT CARDIOVERSION N/A 12/05/2017   Procedure: TRANSESOPHAGEAL ECHOCARDIOGRAM (TEE);  Surgeon: Sanda Klein, MD;  [facility]: [facility] ENDOSCOPY;  Service: Cardiovascular;  Laterality: N/A;  . UPPER EXTREMITY VENOGRAPHY Left 07/13/2018   Procedure: UPPER EXTREMITY VENOGRAPHY - Central & Left Arm;  Surgeon: Conrad , MD;  Location: [place] CV LAB;  Service: Cardiovascular;  Laterality: Left;  [name] VITRECTOMY Bilateral     Social History   Social History Narrative   Patient lives with fiance. She has 1 grown son. Works full-time, she Paramedic for attorney.   Caffeine Use: 2 cups daily; sodas occasionally   She has 7 grandchildren     reports that she has never smoked. She has never used smokeless tobacco. She reports that she does not drink alcohol or use drugs.  Allergies  Allergen Reactions  . Ibuprofen Other (See Comments)    CKD stage 3. Should avoid.  . Dilaudid [Hydromorphone Hcl] Itching and Other (See Comments)    Can take with Benadryl.    Family History  Problem Relation Age of Onset  . Colon  polyps Mother   . Diabetes Mother   . Heart murmur Father        history essentially unknown  . Hypertension Father   . Diabetes Sister   . Kidney failure Sister        kidney transplant  . Diabetes Brother   . Retinal degeneration Brother   . Heart murmur Son   . Stroke Paternal Grandmother   . Diabetes Sister      Prior to Admission medications   Medication Sig Start Date End Date Taking? Authorizing Provider  amLODipine (NORVASC) 10 MG tablet Take 10 mg by mouth at bedtime.   Yes [provider]  calcitRIOL (ROCALTROL) 0.25 MCG capsule Take 0.75 mcg by mouth daily.    Yes [provider]  carvedilol (COREG) 12.5 MG tablet Take 12.5 mg by mouth daily.   Yes [provider]  clopidogrel (PLAVIX) 75 MG tablet Take 1 tablet (75 mg total) by mouth daily. 04/11/18  Yes Briscoe Deutscher, DO  furosemide (LASIX) 80 MG tablet Take 160 mg by mouth daily.   Yes [provider]  gabapentin (NEURONTIN) 300 MG capsule Take 1 capsule (300 mg total) by mouth 2 (two) times daily. Patient taking differently: Take 300 mg by mouth 2 (two) times daily as needed (pain).  04/11/18  Yes Briscoe Deutscher, DO  insulin aspart (NOVOLOG) 100 UNIT/ML injection Inject 6-8 Units into the skin 3 (three) times daily before meals. 8 UNITS WITH BREAKFAST, 6 UNITS WITH LUNCH, & 8 UNITS WITH SUPPER   Yes [provider]  insulin degludec (TRESIBA FLEXTOUCH) 100 UNIT/ML SOPN FlexTouch Pen Inject 28 Units into the skin daily with lunch.    Yes [provider]  simvastatin (ZOCOR) 10 MG tablet Take 0.5 tablets (5 mg total) by mouth at bedtime. 1/2 po q hs - GFR 14 04/11/18  Yes Briscoe Deutscher, DO  epoetin alfa (EPOGEN,PROCRIT) 28786 UNIT/ML injection Inject 30,000 Units into the vein every 14 (fourteen) days.     [provider]  FLOVENT HFA 220 MCG/ACT inhaler INHALE 2 PUFFS INTO THE LUNGS DAILY. 03/04/18   [provider]  lubiprostone (AMITIZA) 24 MCG capsule  Take 1 capsule (24 mcg total) by mouth 2 (two) times daily with a meal. Patient not taking: Reported on 07/11/2018 05/31/18   Zehr, Laban Emperor, PA-C  pantoprazole (PROTONIX) 40 MG tablet Take 1 tablet (40 mg total) by mouth daily. Patient not taking: Reported on 07/11/2018 04/10/18   Zehr, Laban Emperor, PA-C  traMADol (ULTRAM) 50 MG tablet Take 1 tablet (50 mg total) by mouth every 6 (six) hours as needed. 07/11/18   Dorie Rank, MD    Physical Exam:  Constitutional: NAD, calm, comfortable Vitals:   07/21/18 0602 07/21/18 0722  BP: (!) 157/80   Pulse: 80   Resp: 18   Temp: 97.7 F (36.5 C)   TempSrc: Oral   SpO2: 100%   Weight:  103 kg (227 lb)  Height:  5\' 8"  (1.727 m)   Eyes: PERRL, lids and conjunctivae normal ENMT: Mucous membranes are moist. Posterior pharynx clear of any exudate or lesions.Normal dentition.  Neck: normal, supple, no masses, no thyromegaly Respiratory: clear to auscultation bilaterally, no wheezing, no crackles. Normal respiratory effort. No accessory muscle use.  Cardiovascular: Regular rate and rhythm, no murmurs / rubs / gallops. No extremity edema. 2+ pedal pulses. No carotid bruits.  Abdomen: no tenderness, no masses palpated. No hepatosplenomegaly. Bowel sounds positive.  Musculoskeletal: no clubbing / cyanosis. No joint deformity upper and lower extremities. Good ROM, no contractures. Normal muscle tone.  Skin: no rashes, lesions, ulcers. No induration Neurologic: CN 2-12 grossly intact. Sensation intact, DTR normal. Strength 5/5 in all 4.  Psychiatric: Normal judgment and insight. Alert and oriented x 3. Normal mood.    Labs on Admission: I have personally reviewed following labs and imaging studies  CBC: Recent Labs  Lab 07/21/18 0711 07/21/18 0733  HGB 12.9 12.9  HCT 38.0 76.7   Basic Metabolic Panel: Recent Labs  Lab 07/21/18 0711 07/21/18 0733  NA 137 138  K 6.3* 6.6*  CL 109  --   GLUCOSE 185* 197*  BUN 49*  --   CREATININE 6.70*  --     GFR: Estimated Creatinine Clearance: 12.9 mL/min (A) (by C-G formula based on SCr of 6.7 mg/dL (H)). BNP (last 3 results) Recent Labs    01/02/18 0825 04/11/18 1421  PROBNP 32.0 65.0   HbA1C: No results for input(s):  HGBA1C in the last 72 hours. CBG: Recent Labs  Lab 07/21/18 0606  GLUCAP 136*   Urine analysis:    Component Value Date/Time   COLORURINE STRAW (A) 07/11/2018 2004   APPEARANCEUR HAZY (A) 07/11/2018 2004   LABSPEC 1.008 07/11/2018 2004   PHURINE 7.0 07/11/2018 2004   GLUCOSEU 50 (A) 07/11/2018 2004   HGBUR NEGATIVE 07/11/2018 2004   BILIRUBINUR NEGATIVE 07/11/2018 2004   BILIRUBINUR Negative 07/29/2017 0802   BILIRUBINUR neg 08/08/2013   KETONESUR NEGATIVE 07/11/2018 2004   PROTEINUR >=300 (A) 07/11/2018 2004   UROBILINOGEN 0.2 07/29/2017 0802   UROBILINOGEN 0.2 09/07/2015 1230   NITRITE NEGATIVE 07/11/2018 2004   LEUKOCYTESUR NEGATIVE 07/11/2018 2004     EKG: Independently reviewed.  Normal sinus rhythm with normal axes and intervals and no peaked T waves.  Assessment/Plan Principal Problem:   Hyperkalemia Active Problems:   CKD (chronic kidney disease) stage 5, GFR less than 15 ml/min (HCC)   Diabetes mellitus, type II, insulin dependent ([place]), on CGM, followed by Endocrinology, with end organ damage: renal, neuropathy, gastroparesis, retinopathy   Grade I diastolic dysfunction   Essential hypertension   Gastroparesis due to DM ([place])   Secondary hyperparathyroidism ([place])   Proliferative diabetic retinopathy ([place])   Morbid obesity due to excess calories ([place])   1.  Hyperkalemia: Patient with a potassium of 6.6 and a creatinine of 6.7.  Will be unable to adequately remove this potassium without hemodialysis.  She will have a temporary venous access catheter placed today by interventional radiology.  I have given her IV insulin, dextrose, and calcium gluconate as a stat order.  [name] also going to receive sodium polystyrene 30 g orally every 4  hours for 3 doses.  Is pending expected dialysis today.  2.  Chronic kidney disease stage V with a GFR of less than 15: Patient had presented for permanent vascular access placement but unfortunately that will be unable to be done today due to her potassium levels.  She will have a break catheter placed for dialysis today and will proceed to dialysis.  Dr. [name] has already discussed with Dr. Justin Mend.  Dr. Justin Mend will see the patient in consultation.  3.  Diabetes mellitus type 2 with endorgan damage including renal, neurological, gastric, and retinopathy.  We will monitor patient's blood glucoses closely and treat conservatively given her renal failure.  4.  Grade 1 diastolic dysfunction with preserved ejection fraction: Continue blood pressure management.  Patient with evidence of left ventricular hypertrophy on echocardiogram from February 2019.  5.  Essential hypertension: Continue blood pressure management which should improve once dialysis started.  6.  Gastroparesis due to diabetes: Continue home medication and treatments.  7.  Secondary hyperparathyroidism: Continue home treatments and binders as necessary.  8.  Proliferative diabetic retinopathy continue home medications and treatments.  9.  Morbid obesity due to excess calories: BMI currently 34.  Continue with dietary management.  DVT prophylaxis: Subcu heparin Code Status: Full code Family Communication: Talk with patient's mother who was present at the time of admission.  Patient retains capacity. Disposition Plan: Likely home in 3 to 4 days. Consults called: Nephrology Dr. Justin Mend, vascular Dr. [name] Admission status: Inpatient   Lady Deutscher MD [facility] Pager [phone]  If 7PM-7AM, please contact night-coverage [email or web address] Password [email or web address]  07/21/2018, 8:51 AM

## 2018-07-21 NOTE — Progress Notes (Addendum)
   Daily Progress Note  BMP Latest Ref Rng & Units 07/21/2018 07/21/2018 07/13/2018  Glucose 70 - 99 mg/dL 197(H) 185(H) 195(H)  BUN 6 - 20 mg/dL - 49(H) 51(H)  Creatinine 0.44 - 1.00 mg/dL - 6.70(H) 7.10(H)  BUN/Creat Ratio - - - -  Sodium 135 - 145 mmol/L 138 137 139  Potassium 3.5 - 5.1 mmol/L 6.6(HH) 6.3(HH) 4.9  Chloride 98 - 111 mmol/L - 109 109  CO2 22 - 32 mmol/L - - -  Calcium 8.9 - 10.3 mg/dL - - -    Repeat K at 6.6, so too high to proceed with case.  I discussed with Renal likely start of end stage renal in this patient given elevated Cr and elevated K  Renal recommends:  Admit to Hospitalist service  Inpatient start of hemodialysis after placement of temporary dialysis catheter  Conversion of dialysis catheter and permanent access while admitted  Case discussed with IR and Hospitalist service  Adele Barthel, MD, [place] [facility] [place] Office: [phone] Pager: [phone]  07/21/2018, 7:57 AM

## 2018-07-21 NOTE — Progress Notes (Signed)
Report given to [place] on [place].

## 2018-07-21 NOTE — Progress Notes (Signed)
HD tx completed @ 2055 w/o problem, UF goal met, blood rinsed back, VSS, report called to Marlene Lard, RN

## 2018-07-21 NOTE — Consult Note (Signed)
Referring Provider: No ref. provider found Primary Care Physician:  Briscoe Deutscher, DO Primary Nephrologist:  Dr. Jimmy Footman   Reason for Consultation:  New start dialysis   Hypertension  Hyperkalemia and volume overload   HPI:  This is a 47 year old lady with end stage renal disease/ Stage 5  History of hypertension and diabetes and medical non compliance. She was brought in by Dr [name] for evaluation of redo of left upper arm AVG. Unfortunately she has had many vascular procedures since February 2017 and has been followed by [facility]. There have been multiple attemps and both right and left arm fistula and removal of a AVG for steal syndrome right sided. She was found to have a potassium 6.6. I believe that she will need to start dialysis at this point her Creatinine is 6-7.  The hemoglobin was good above 10.  Past Medical History:  Diagnosis Date  . Allergy   . Anemia of chronic disease   . B12 deficiency 05/10/2014  . Blood transfusion without reported diagnosis   . Bronchitis   . Chronic constipation   . Chronic kidney disease   . Constipation   . CVA (cerebral vascular accident) ([place]) 12/03/2017   nonhemorrhagic infarct-- inferior right cerebellum  . Diabetic neuropathy, painful ([place]) 07/13/2013   Stop lyrica due to cost & SE of drowsy. Gabapentin helps Stay off hydrocodone: pt states she thinks "she was addicted" to med. "I don't want that anymore."   . DM2 (diabetes mellitus, type 2) ([place])   . Dyspnea    with exertion  . Fibromyalgia   . Gastroparesis due to DM   . GERD (gastroesophageal reflux disease)   . Headache   . Heart murmur, systolic 53/01/9923   Grade 1/6 ejection murmur per [facility] notes.   . Hyperlipemia   . Hypertension   . IBS (irritable bowel syndrome)   . Infertility   . Leiomyoma of uterus   . Morbid obesity due to excess calories ([place]) 11/16/2016  . Neuropathy   . Obesity   . Pancreatitis   . Pneumonia   . 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  Right-sided low back pain without sciatica 12/08/2015  . Secondary hyperthyroidism 11/27/2013   Followed by [facility], notes reviewed 01/01/15. PTH had improved with oral vitamin D: 133 to 85. Last 155-probably not taking vit D.    [name] Upper airway cough syndrome 11/15/2016   rec off acei/ on pepcid hs 11/15/2016 >>>     Past Surgical History:  Procedure Laterality Date  . ARTERY REPAIR Right 02/17/2018   Procedure: EXPLORATION OF RIGHT BRACHIAL ARTERY;  Surgeon: Conrad Legend Lake, MD;  Location: [place];  Service: Vascular;  Laterality: Right;  . ARTERY REPAIR Right 02/17/2018   Procedure: BRACHIAL ARTERY EXPLORATION AND TRHOMBECTOMY;  Surgeon: Conrad Atlantic, MD;  Location[facility] OR;  Service: Vascular;  Laterality: Right;  . AV FISTULA PLACEMENT Left 05/09/2017   Procedure: INSERTION OF ARTERIOVENOUS (AV) GRAFT ARM (ARTEGRAFT);  Surgeon: Conrad Moro, MD;  Location: [facility] OR;  Service: Vascular;  Laterality: Left;  . AV FISTULA PLACEMENT Right 02/17/2018   Procedure: INSERTION OF ARTERIOVENOUS (AV) GORE-TEX GRAFT ARM RIGHT UPPER ARM;  Surgeon: Conrad Country Walk, MD;  Location: [place];  Service: Vascular;  Laterality: Right;  . BASCILIC VEIN TRANSPOSITION Left 08/31/2016   Procedure: BASILIC VEIN TRANSPOSITION FIRST STAGE;  Surgeon: Conrad Velda [place], MD;  Location: [place];  Service: Vascular;  Laterality: Left;  . EYE  SURGERY     secondary to diabetic retinopathy   . FOOT SURGERY Right    t"ook bone out- maybe hammer toe"  . LIGATION OF ARTERIOVENOUS  FISTULA Left 05/09/2017   Procedure: LIGATION OF ARTERIOVENOUS  FISTULA;  Surgeon: Conrad Pomaria, MD;  Location: [place];  Service: Vascular;  Laterality: Left;  . LOOP RECORDER INSERTION N/A 12/05/2017   Procedure: LOOP RECORDER INSERTION;  Surgeon: Evans Lance, MD;  Location: [place] [facility] LAB;  Service: Cardiovascular;  Laterality: N/A;  . MYOMECTOMY    . REMOVAL OF GRAFT Right 02/17/2018   Procedure: REMOVAL OF RIGHT UPPER ARM ARTERIOVENOUS GRAFT;   Surgeon: Conrad [place], MD;  Location: [place];  Service: Vascular;  Laterality: Right;  . TEE WITHOUT CARDIOVERSION N/A 12/05/2017   Procedure: TRANSESOPHAGEAL ECHOCARDIOGRAM (TEE);  Surgeon: Sanda Klein, MD;  Location: [facility] ENDOSCOPY;  Service: Cardiovascular;  Laterality: N/A;  . UPPER EXTREMITY VENOGRAPHY Left 07/13/2018   Procedure: UPPER EXTREMITY VENOGRAPHY - Central & Left Arm;  Surgeon: Conrad Stallings, MD;  Location: [place] [facility] LAB;  Service: Cardiovascular;  Laterality: Left;  . UTERINE FIBROID SURGERY    . VITRECTOMY Bilateral     Prior to Admission medications   Medication Sig Start Date End Date Taking? Authorizing Provider  amLODipine (NORVASC) 10 MG tablet Take 10 mg by mouth at bedtime.   Yes [provider]  calcitRIOL (ROCALTROL) 0.25 MCG capsule Take 0.75 mcg by mouth daily.    Yes [provider]  carvedilol (COREG) 12.5 MG tablet Take 12.5 mg by mouth daily.   Yes [provider]  clopidogrel (PLAVIX) 75 MG tablet Take 1 tablet (75 mg total) by mouth daily. 04/11/18  Yes Briscoe Deutscher, DO  epoetin alfa (EPOGEN,PROCRIT) 24401 UNIT/ML injection Inject 30,000 Units into the vein every 14 (fourteen) days.    Yes [provider]  furosemide (LASIX) 80 MG tablet Take 160 mg by mouth daily.   Yes [provider]  gabapentin (NEURONTIN) 300 MG capsule Take 1 capsule (300 mg total) by mouth 2 (two) times daily. Patient taking differently: Take 300 mg by mouth 2 (two) times daily as needed (pain).  04/11/18  Yes Briscoe Deutscher, DO  insulin aspart (NOVOLOG) 100 UNIT/ML injection Inject 6-8 Units into the skin 3 (three) times daily before meals. 8 UNITS WITH BREAKFAST, 6 UNITS WITH LUNCH, & 8 UNITS WITH SUPPER   Yes [provider]  insulin degludec (TRESIBA FLEXTOUCH) 100 UNIT/ML SOPN FlexTouch Pen Inject 28 Units into the skin daily with lunch.    Yes [provider]  lubiprostone (AMITIZA) 24 MCG capsule Take 1 capsule  (24 mcg total) by mouth 2 (two) times daily with a meal. 05/31/18  Yes Zehr, Laban Emperor, PA-C  simvastatin (ZOCOR) 10 MG tablet Take 0.5 tablets (5 mg total) by mouth at bedtime. 1/2 po q hs - GFR 14 04/11/18  Yes Briscoe Deutscher, DO  FLOVENT HFA 220 MCG/ACT inhaler INHALE 2 PUFFS INTO THE LUNGS DAILY. 03/04/18   [provider]  traMADol (ULTRAM) 50 MG tablet Take 1 tablet (50 mg total) by mouth every 6 (six) hours as needed. 07/11/18   Dorie Rank, MD    Current Facility-Administered Medications  Medication Dose Route Frequency Provider Last Rate Last Dose  . 0.9 %  sodium chloride infusion   Intravenous Continuous Lady Deutscher, MD 10 mL/hr at 07/21/18 1102    . 0.9 %  sodium chloride infusion  500 mL Intravenous Once Sheehan,  Wyatt Haste, MD      . acetaminophen (TYLENOL) tablet 650 mg  650 mg Oral Q6H PRN Lady Deutscher, MD       Or  . acetaminophen (TYLENOL) suppository 650 mg  650 mg Rectal Q6H PRN Lady Deutscher, MD      . amLODipine (NORVASC) tablet 10 mg  10 mg Oral QHS Lady Deutscher, MD      . budesonide (PULMICORT) nebulizer solution 0.5 mg  0.5 mg Nebulization BID Lady Deutscher, MD      . calcitRIOL (ROCALTROL) capsule 0.75 mcg  0.75 mcg Oral Daily Lady Deutscher, MD      . carvedilol (COREG) tablet 12.5 mg  12.5 mg Oral Daily Lady Deutscher, MD      . chlorhexidine (HIBICLENS) 4 % liquid 4 application  60 mL Topical Once Lady Deutscher, MD       And  . Derrill Memo ON 07/22/2018] chlorhexidine (HIBICLENS) 4 % liquid 4 application  60 mL Topical Once Lady Deutscher, MD      . clopidogrel (PLAVIX) tablet 75 mg  75 mg Oral Daily Lady Deutscher, MD      . gabapentin (NEURONTIN) capsule 300 mg  300 mg Oral BID PRN Lady Deutscher, MD      . heparin 1000 UNIT/ML injection           . heparin injection 5,000 Units  5,000 Units Subcutaneous Q8H Lady Deutscher, MD      . insulin aspart (novoLOG) injection 0-9 Units  0-9 Units Subcutaneous TID WC  Lady Deutscher, MD      . insulin aspart (novoLOG) injection 6-8 Units  6-8 Units Subcutaneous TID AC Lady Deutscher, MD      . insulin degludec (TRESIBA) 100 UNIT/ML FlexTouch Pen 28 Units  28 Units Subcutaneous Q lunch Lady Deutscher, MD      . lidocaine (XYLOCAINE) 1 % (with pres) injection           . lubiprostone (AMITIZA) capsule 24 mcg  24 mcg Oral BID WC Lady Deutscher, MD      . ondansetron [facility]) tablet 4 mg  4 mg Oral Q6H PRN Lady Deutscher, MD       Or  . ondansetron [facility]) injection 4 mg  4 mg Intravenous Q6H PRN Lady Deutscher, MD      . simvastatin (ZOCOR) tablet 5 mg  5 mg Oral QHS Lady Deutscher, MD      . traMADol Veat[facility]bon) tablet 50 mg  50 mg Oral Q6H PRN Lady Deutscher, MD      . traMADol [facility]at[facility] Bourbon) tablet 50 mg  50 mg Oral Q6H PRN Lady Deutscher, MD      . zolpidem (AMBIEN) tablet 5 mg  5 mg Oral QHS PRN Lady Deutscher, MD       Facility-Administered Medications Ordered in Other Encounters  Medication Dose Route Frequency Provider Last Rate Last Dose  . ondansetron (ZOFRAN) injection   Intravenous Anesthesia Intra-op Moshe Salisbury, CRNA   4 mg at 07/21/18 0263    Allergies as of 07/13/2018 - Review Complete 07/13/2018  Allergen Reaction Noted  . Ibuprofen Other (See Comments) 11/27/2013  . Dilaudid [hydromorphone hcl] Itching and Other (See Comments) 11/22/2012    Family History  Problem Relation Age of Onset  . Colon polyps Mother   . Diabetes Mother   . Heart murmur Father  history essentially unknown  . Hypertension Father   . Diabetes Sister   . Kidney failure Sister        kidney transplant  . Diabetes Brother   . Retinal degeneration Brother   . Heart murmur Son   . Stroke Paternal Grandmother   . Diabetes Sister     Social History   Socioeconomic History  . Marital status: Married    Spouse name: Not on file  . Number of children: 1  . Years of education: college  . Highest education level:  Not on file  Occupational History  . Occupation: Armed forces technical officer: [employer]  Social Needs  . Financial resource strain: Not on file  . Food insecurity:    Worry: Not on file    Inability: Not on file  . Transportation needs:    Medical: Not on file    Non-medical: Not on file  Tobacco Use  . Smoking status: Never Smoker  . Smokeless tobacco: Never Used  Substance and Sexual Activity  . Alcohol use: No    Alcohol/week: 0.0 oz  . Drug use: No  . Sexual activity: Yes    Partners: Male    Birth control/protection: None  Lifestyle  . Physical activity:    Days per week: Not on file    Minutes per session: Not on file  . Stress: Not on file  Relationships  . Social connections:    Talks on phone: Not on file    Gets together: Not on file    Attends religious service: Not on file    Active member of club or organization: Not on file    Attends meetings of clubs or organizations: Not on file    Relationship status: Not on file  . Intimate partner violence:    Fear of current or ex partner: Not on file    Emotionally abused: Not on file    Physically abused: Not on file    Forced sexual activity: Not on file  Other Topics Concern  . Not on file  Social History Narrative   Patient lives with fiance. She has 1 grown son. Works full-time, she Paramedic for attorney.   Caffeine Use: 2 cups daily; sodas occasionally   She has 7 grandchildren    Review of Systems: Gen: no fever, chills, sweats, + anorexia, + fatigue, + weakness,  HEENT: No visual complaints,  + history of Retinopathy. Normal external appearance No Epistaxis or Sore throat. No sinusitis.   CV: Denies chest pain, angina, palpitations, syncope, orthopnea, PND, peripheral edema, and claudication. Resp: Denies dyspnea at rest, dyspnea with exercise, cough, sputum, wheezing, coughing up blood, and pleurisy. GI: Denies vomiting blood, jaundice, and fecal incontinence.   Denies dysphagia or  odynophagia. GU : Denies urinary burning, blood in urine, urinary frequency, urinary hesitancy, nocturnal urination, and urinary incontinence.  No renal calculi. MS: Denies joint pain, limitation of movement, and swelling, stiffness, low back pain, extremity pain. Denies muscle weakness, cramps, atrophy.  No use of non steroidal antiinflammatory drugs. Derm: Denies rash, itching, dry skin, hives, moles, warts, or unhealing ulcers.  Psych: Denies depression, anxiety, memory loss, suicidal ideation, hallucinations, paranoia, and confusion. Heme: Denies bruising, bleeding, and enlarged lymph nodes. Neuro: No headache.  No diplopia. No dysarthria.  No dysphasia.  No history of CVA.  No Seizures. No paresthesias.  No weakness. Endocrine + DM.  No Thyroid disease.  No Adrenal disease.  Physical Exam: Vital signs  in last 24 hours: Temp:  [97.7 F (36.5 C)] 97.7 F (36.5 C) (07/26 1026) Pulse Rate:  [80-83] 83 (07/26 1026) Resp:  [18-20] 20 (07/26 1026) BP: (157-168)/(80-90) 168/90 (07/26 1026) SpO2:  [100 %] 100 % (07/26 1026) Weight:  [227 lb (103 kg)] 227 lb (103 kg) (07/26 0722) Last BM Date: 07/17/18 General:   Alert,  Well-developed, well-nourished, pleasant and cooperative in NAD Head:  Normocephalic and atraumatic. Eyes:  Sclera clear, no icterus.   Conjunctiva pink. Ears:  Normal auditory acuity. Nose:  No deformity, discharge,  or lesions. Mouth:  No deformity or lesions, dentition normal. Neck:  Supple; no masses or thyromegaly. JVP not elevated Lungs:  Clear throughout to auscultation.   No wheezes, crackles, or rhonchi. No acute distress. Heart:  Regular rate and rhythm; no murmurs, clicks, rubs,  or gallops. Abdomen:  Soft, nontender and nondistended. No masses, hepatosplenomegaly or hernias noted. Normal bowel sounds, without guarding, and without rebound.   Msk:  Symmetrical without gross deformities. Normal posture. Pulses:  No carotid, renal, femoral bruits. DP and PT  symmetrical and equal Extremities:  Without clubbing or edema. Neurologic:  Alert and  oriented x4;  grossly normal neurologically. Skin:  Intact without significant lesions or rashes. Cervical Nodes:  No significant cervical adenopathy. Psych:  Alert and cooperative. Normal mood and affect.  Intake/Output from previous day: No intake/output data recorded. Intake/Output this shift: No intake/output data recorded.  Lab Results: Recent Labs    07/21/18 0711 07/21/18 0733 07/21/18 1051  WBC  --   --  8.1  HGB 12.9 12.9 10.7*  HCT 38.0 38.0 35.1*  PLT  --   --  194   BMET Recent Labs    07/21/18 0711 07/21/18 0733 07/21/18 0942  NA 137 138  --   K 6.3* 6.6*  --   CL 109  --   --   GLUCOSE 185* 197*  --   BUN 49*  --   --   CREATININE 6.70*  --  6.27*   LFT No results for input(s): PROT, ALBUMIN, AST, ALT, ALKPHOS, BILITOT, BILIDIR, IBILI in the last 72 hours. PT/INR No results for input(s): LABPROT, INR in the last 72 hours. Hepatitis Panel No results for input(s): HEPBSAG, HCVAB, HEPAIGM, HEPBIGM in the last 72 hours.  Studies/Results: [name]  Result Date: 07/21/2018 If [employer] not attached, placement could not be confirmed due to current cardiac rhythm.   Assessment/Plan:  CKD stage 5 . Followed by Dr Jimmy Footman and now with hyperkalemia and now end stage renal disease. Will have a temporary dialysis catheter placed and will have [facility] follow up with patient for access plans   Possibly placement of thigh graft. We will need to proceed with CLIP and placement in outpatient dialysis unit.  Anemia looks stable and consider micera and iron  Not an issue at this point  Hypertension  BP 160 - 170 / 90 - 100 will challenge for fluid removal  Bones  Will add calcium and phosphorus and will evaluate for binders last Phos 4.6  Diabetes per primary team   LOS: 0 Skyy Mcknight W @TODAY @1 :25 PM

## 2018-07-21 NOTE — H&P (Signed)
Patient Status: [facility] - In-pt  Assessment and Plan:  End stage renal failure with malfunctioning arteriovenous access.  Hyperkalemia = vascular surgery unable to proceed today.  Patient in need of temporary hemodiaysis access.   Will proceed with temp cath today by Dr. Kathlene Cote.  Risks and benefits discussed with the patient including, but not limited to bleeding, infection, vascular injury, pneumothorax which may require chest tube placement, air embolism or even death  All of the patient's questions were answered, patient is agreeable to proceed. Consent signed and in chart.   ______________________________________________________________________   History of Present Illness: Heather [name] is a 47 y.o. female with end stage renal failure on hemodialysis.  She had a planned re-do AV graft but her potassium was 6.6 on pre-op labs.  Allergies and medications reviewed.   Review of Systems: A 12 point ROS discussed and pertinent positives are indicated in the HPI above.  All other systems are negative. Review of Systems  Vital Signs: BP (!) 168/90 (BP Location: Right Arm)   Pulse 83   Temp 97.7 F (36.5 C) (Oral)   Resp 20   Ht 5\' 8"  (1.727 m)   Wt 227 lb (103 kg)   LMP 07/17/2018 (Exact Date)   SpO2 100%   BMI 34.52 kg/m   Physical Exam  Constitutional: [name] oriented to person, place, and time. She appears well-developed.  HENT:  Head: Normocephalic and atraumatic.  Eyes: EOM are normal.  Neck: Normal range of motion.  Cardiovascular: Normal rate and regular rhythm.  Pulmonary/Chest: Effort normal. No respiratory distress.  Musculoskeletal: Normal range of motion.  Neurological: [name] alert and oriented to person, place, and time.  Skin: Skin is warm and dry.  Psychiatric: She has a normal mood and affect. Her behavior is normal. Judgment and thought content normal.  Vitals reviewed.    Imaging reviewed.   Labs:  COAGS: Recent Labs   02/17/18 2338 07/11/18 1821  INR 1.11 1.01  APTT  --  30    BMP: Recent Labs    02/17/18 2338 02/19/18 1847 03/22/18 1351 04/11/18 1421  06/05/18 0850 07/11/18 1821 07/13/18 0635 07/21/18 0711 07/21/18 0733 07/21/18 0942  NA 135 135 136 135   < > 137 138 139 137 138  --   K 4.8 5.1 4.3 4.3   < > 3.7 5.7* 4.9 6.3* 6.6*  --   CL 105 104 106 109  --  108 106 109 109  --   --   CO2 19* 21* 23 22  --  20 24  --   --   --   --   GLUCOSE 143* 186* 195* 158*  --  150* 92 195* 185* 197*  --   BUN 34* 39* 26* 28*   < > 34* 45* 51* 49*  --   --   CALCIUM 8.4* 8.0* 8.1* 8.0*  --  8.0* 8.0*  --   --   --   --   CREATININE 4.65* 6.06* 4.63* 4.32*   < > 5.77* 6.22* 7.10* 6.70*  --  6.27*  GFRNONAA 10* 7*  --   --   --   --  7*  --   --   --  7*  GFRAA 12* 9*  --   --   --   --  8*  --   --   --  8*   < > = values in this interval not displayed.  Electronically Signed: Murrell Redden, PA-C 07/21/2018, 11:06 AM   I spent a total of 15 minutes in face to face in clinical consultation, greater than 50% of which was counseling/coordinating care for venous access.

## 2018-07-21 NOTE — Anesthesia Preprocedure Evaluation (Signed)
Anesthesia Evaluation  Patient identified by MRN, date of birth, ID band Patient awake    Reviewed: Allergy & Precautions, H&P , NPO status , Patient's Chart, lab work & pertinent test results  Airway Mallampati: II   Neck ROM: full    Dental   Pulmonary shortness of breath,    breath sounds clear to auscultation       Cardiovascular hypertension,  Rhythm:regular Rate:Normal     Neuro/Psych  Headaches, PSYCHIATRIC DISORDERS Depression  Neuromuscular disease CVA    GI/Hepatic GERD  ,  Endo/Other  diabetes, Type 2Hyperthyroidism   Renal/GU ESRF and DialysisRenal disease     Musculoskeletal  (+) Fibromyalgia -  Abdominal   Peds  Hematology  (+) anemia ,   Anesthesia Other Findings   Reproductive/Obstetrics                             Anesthesia Physical Anesthesia Plan  ASA: III  Anesthesia Plan: General   Post-op Pain Management:    Induction: Intravenous  PONV Risk Score and Plan: 3 and Ondansetron and Treatment may vary due to age or medical condition  Airway Management Planned: LMA  Additional Equipment:   Intra-op Plan:   Post-operative Plan:   Informed Consent: I have reviewed the patients History and Physical, chart, labs and discussed the procedure including the risks, benefits and alternatives for the proposed anesthesia with the patient or authorized representative who has indicated his/her understanding and acceptance.     Plan Discussed with: CRNA, Anesthesiologist and Surgeon  Anesthesia Plan Comments:         Anesthesia Quick Evaluation

## 2018-07-22 ENCOUNTER — Other Ambulatory Visit: Payer: Self-pay

## 2018-07-22 LAB — BASIC METABOLIC PANEL
Anion gap: 5 (ref 5–15)
BUN: 43 mg/dL — ABNORMAL HIGH (ref 6–20)
CALCIUM: 8.1 mg/dL — AB (ref 8.9–10.3)
CHLORIDE: 107 mmol/L (ref 98–111)
CO2: 25 mmol/L (ref 22–32)
CREATININE: 5.6 mg/dL — AB (ref 0.44–1.00)
GFR calc Af Amer: 9 mL/min — ABNORMAL LOW (ref >60)
GFR, EST NON AFRICAN AMERICAN: 8 mL/min — AB (ref >60)
Glucose, Bld: 127 mg/dL — ABNORMAL HIGH (ref 70–99)
POTASSIUM: 4.9 mmol/L (ref 3.5–5.1)
SODIUM: 137 mmol/L (ref 135–145)

## 2018-07-22 LAB — GLUCOSE, CAPILLARY
GLUCOSE-CAPILLARY: 119 mg/dL — AB (ref 70–99)
GLUCOSE-CAPILLARY: 162 mg/dL — AB (ref 70–99)
GLUCOSE-CAPILLARY: 162 mg/dL — AB (ref 70–99)

## 2018-07-22 LAB — HEPATITIS B SURFACE ANTIGEN: HEP B S AG: NEGATIVE

## 2018-07-22 LAB — HEPATITIS B SURFACE ANTIBODY,QUALITATIVE: HEP B S AB: REACTIVE

## 2018-07-22 LAB — HEPATITIS B CORE ANTIBODY, TOTAL: Hep B Core Total Ab: NEGATIVE

## 2018-07-22 MED ORDER — CHLORHEXIDINE GLUCONATE 4 % EX LIQD: 60.0000 mL | Freq: Once | CUTANEOUS | Status: DC

## 2018-07-22 MED ORDER — DIPHENHYDRAMINE HCL 25 MG PO CAPS
25.0000 mg | ORAL_CAPSULE | Freq: Four times a day (QID) | ORAL | Status: DC | PRN
  Administered 2018-07-22 – 2018-07-26 (×6): 25 mg via ORAL
  Filled 2018-07-22 (×6): qty 1

## 2018-07-22 MED ORDER — SODIUM CHLORIDE 0.9 % IV SOLN: 100.0000 mL | INTRAVENOUS | Status: DC | PRN

## 2018-07-22 MED ORDER — CHLORHEXIDINE GLUCONATE CLOTH 2 % EX PADS
6.0000 | MEDICATED_PAD | Freq: Every day | CUTANEOUS | Status: DC
  Administered 2018-07-22 – 2018-07-23 (×2): 6 via TOPICAL

## 2018-07-22 MED ORDER — LIDOCAINE HCL (PF) 1 % IJ SOLN: 5.0000 mL | INTRAMUSCULAR | Status: DC | PRN

## 2018-07-22 MED ORDER — LIDOCAINE-PRILOCAINE 2.5-2.5 % EX CREA: 1.0000 "application " | TOPICAL_CREAM | CUTANEOUS | Status: DC | PRN

## 2018-07-22 MED ORDER — DIPHENHYDRAMINE HCL 25 MG PO CAPS
ORAL_CAPSULE | ORAL | Status: AC
  Filled 2018-07-22: qty 1

## 2018-07-22 MED ORDER — PENTAFLUOROPROP-TETRAFLUOROETH EX AERO: 1.0000 "application " | INHALATION_SPRAY | CUTANEOUS | Status: DC | PRN

## 2018-07-22 MED ORDER — ALTEPLASE 2 MG IJ SOLR: 2.0000 mg | Freq: Once | INTRAMUSCULAR | Status: DC | PRN

## 2018-07-22 MED ORDER — HYDROXYZINE HCL 25 MG PO TABS
25.0000 mg | ORAL_TABLET | Freq: Once | ORAL | Status: AC
  Administered 2018-07-22: 25 mg via ORAL
  Filled 2018-07-22: qty 1

## 2018-07-22 MED ORDER — SODIUM CHLORIDE 0.9% FLUSH: 10.0000 mL | INTRAVENOUS | Status: DC | PRN

## 2018-07-22 MED ORDER — HEPARIN SODIUM (PORCINE) 1000 UNIT/ML DIALYSIS: 1000.0000 [IU] | INTRAMUSCULAR | Status: DC | PRN

## 2018-07-22 NOTE — Progress Notes (Signed)
Pt had order to change central  Line dressing change today post IR, per iv team nurse she spoke to Dr Justin Mend who said to wait 7 days as increase risk of infection unless dressing is soiled or loose.she said she canceld the order

## 2018-07-22 NOTE — Progress Notes (Signed)
Pt. to HD via bed in stable condition.

## 2018-07-22 NOTE — Progress Notes (Signed)
Pt is NPO and asking why, reviewed notes and unclear, will paged MD to advise

## 2018-07-22 NOTE — Progress Notes (Addendum)
[facility] ROUNDING NOTE   Subjective:   No complaints last night  Appreciate Dr Nicole Cella assistance, she is to have a new AVG placed Monday and requested that dialysis is performed early Monday to facilitate surgery. Dialysis yesterday was uneventful with removal of 2 L will plan dialysis today and Monday to optimize patient preop.   Objective:  Vital signs in last 24 hours:  Temp:  [97.6 F (36.4 C)-99.1 F (37.3 C)] 97.9 F (36.6 C) (07/27 0605) Pulse Rate:  [79-89] 79 (07/27 0605) Resp:  [10-18] 16 (07/27 0605) BP: (116-199)/(66-111) 133/66 (07/27 0605) SpO2:  [94 %-99 %] 94 % (07/27 0605) Weight:  [231 lb 4.2 oz (104.9 kg)-239 lb 13.8 oz (108.8 kg)] 231 lb 4.2 oz (104.9 kg) (07/27 0605)  Weight change:  Filed Weights   07/21/18 1845 07/21/18 2102 07/22/18 0605  Weight: 239 lb 13.8 oz (108.8 kg) 235 lb 7.2 oz (106.8 kg) 231 lb 4.2 oz (104.9 kg)    Intake/Output: I/O last 3 completed shifts: In: 378.2 [P.O.:240; I.V.:138.2] Out: 2000 [Other:2000]   Intake/Output this shift:  Total I/O In: 120 [P.O.:120] Out: 0   CVS- RRR RS- CTA temp L IJ   ABD- BS present soft non-distended EXT- no edema   Basic Metabolic Panel: Recent Labs  Lab 07/21/18 0711 07/21/18 0733 07/21/18 0942 07/22/18 0309  NA 137 138 137 137  K 6.3* 6.6* 6.7* 4.9  CL 109  --  111 107  CO2  --   --  16* 25  GLUCOSE 185* 197* 200* 127*  BUN 49*  --  53* 43*  CREATININE 6.70*  --  6.13*  6.27* 5.60*  CALCIUM  --   --  8.1* 8.1*  PHOS  --   --  4.8*  --     Liver Function Tests: Recent Labs  Lab 07/21/18 0942 07/21/18 1931  ALT  --  19  ALBUMIN 2.7*  --    No results for input(s): LIPASE, AMYLASE in the last 168 hours. No results for input(s): AMMONIA in the last 168 hours.  CBC: Recent Labs  Lab 07/21/18 0711 07/21/18 0733 07/21/18 1051  WBC  --   --  8.1  HGB 12.9 12.9 10.7*  HCT 38.0 38.0 35.1*  MCV  --   --  97.2  PLT  --   --  194    Cardiac  Enzymes: No results for input(s): CKTOTAL, CKMB, CKMBINDEX, TROPONINI in the last 168 hours.  BNP: Invalid input(s): POCBNP  CBG: Recent Labs  Lab 07/21/18 0940 07/21/18 1159 07/21/18 1647 07/21/18 2157 07/22/18 0744  GLUCAP 185* 104* 147* 99 119*    Microbiology: Results for orders placed or performed during the hospital encounter of 12/02/17  Culture, Urine     Status: Abnormal   Collection Time: 12/06/17  4:31 PM  Result Value Ref Range Status   Specimen Description URINE, CLEAN CATCH  Final   Special Requests NONE  Final   Culture >=100,000 COLONIES/mL KLEBSIELLA PNEUMONIAE (A)  Final   Report Status 12/08/2017 FINAL  Final   Organism ID, Bacteria KLEBSIELLA PNEUMONIAE (A)  Final      Susceptibility   Klebsiella pneumoniae - MIC*    AMPICILLIN >=32 RESISTANT Resistant     CEFAZOLIN <=4 SENSITIVE Sensitive     CEFTRIAXONE <=1 SENSITIVE Sensitive     CIPROFLOXACIN <=0.25 SENSITIVE Sensitive     GENTAMICIN <=1 SENSITIVE Sensitive     IMIPENEM <=0.25 SENSITIVE Sensitive     NITROFURANTOIN 64  INTERMEDIATE Intermediate     TRIMETH/SULFA <=20 SENSITIVE Sensitive     AMPICILLIN/SULBACTAM 4 SENSITIVE Sensitive     PIP/TAZO <=4 SENSITIVE Sensitive     Extended ESBL NEGATIVE Sensitive     * >=100,000 COLONIES/mL KLEBSIELLA PNEUMONIAE    Coagulation Studies: No results for input(s): LABPROT, INR in the last 72 hours.  Urinalysis: No results for input(s): COLORURINE, LABSPEC, PHURINE, GLUCOSEU, HGBUR, BILIRUBINUR, KETONESUR, PROTEINUR, UROBILINOGEN, NITRITE, LEUKOCYTESUR in the last 72 hours.  Invalid input(s): APPERANCEUR    Imaging: Ir Fluoro Guide Cv Line Right  Result Date: 07/21/2018 CLINICAL DATA:  Renal failure and need for hemodialysis due to hyperkalemia. EXAM: NON-TUNNELED CENTRAL VENOUS CATHETER PLACEMENT WITH ULTRASOUND AND FLUOROSCOPIC GUIDANCE FLUOROSCOPY TIME:  18 seconds.  5.4 mGy. PROCEDURE: The procedure, risks, benefits, and alternatives were explained  to the patient. Questions regarding the procedure were encouraged and answered. The patient understands and consents to the procedure. A time-out was performed prior to initiating the procedure. The right neck and chest were prepped with chlorhexidine in a sterile fashion, and a sterile drape was applied covering the operative field. Maximum barrier sterile technique with sterile gowns and gloves were used for the procedure. Local anesthesia was provided with 1% lidocaine. Ultrasound was used to confirm patency of the right internal jugular vein. After creating a small venotomy incision, a 21 gauge needle was advanced into the right internal jugular vein under direct, real-time ultrasound guidance. Ultrasound image documentation was performed. After securing guidewire the venous access was dilated. A 13 French, triple lumen Mahurkar non tunneled dialysis catheter was advanced over the wire. Final catheter positioning was confirmed and documented with a fluoroscopic spot image. The catheter was aspirated, flushed with saline, and injected with appropriate volume heparin dwells. The catheter exit site was secured with 0-Prolene retention sutures. COMPLICATIONS: None.  No pneumothorax. FINDINGS: After catheter placement, the tip lies at the cavoatrial junction. The catheter aspirates normally and is ready for immediate use. IMPRESSION: Placement of non-tunneled central venous hemodialysis catheter via the right internal jugular vein. The catheter tip lies at the cavoatrial junction. The catheter is ready for immediate use. Electronically Signed   By: Aletta Edouard M.D.   On: 07/21/2018 16:59   Ir US Guide Vasc Access Right  Result Date: 07/21/2018 CLINICAL DATA:  Renal failure and need for hemodialysis due to hyperkalemia. EXAM: NON-TUNNELED CENTRAL VENOUS CATHETER PLACEMENT WITH ULTRASOUND AND FLUOROSCOPIC GUIDANCE FLUOROSCOPY TIME:  18 seconds.  5.4 mGy. PROCEDURE: The procedure, risks, benefits, and  alternatives were explained to the patient. Questions regarding the procedure were encouraged and answered. The patient understands and consents to the procedure. A time-out was performed prior to initiating the procedure. The right neck and chest were prepped with chlorhexidine in a sterile fashion, and a sterile drape was applied covering the operative field. Maximum barrier sterile technique with sterile gowns and gloves were used for the procedure. Local anesthesia was provided with 1% lidocaine. Ultrasound was used to confirm patency of the right internal jugular vein. After creating a small venotomy incision, a 21 gauge needle was advanced into the right internal jugular vein under direct, real-time ultrasound guidance. Ultrasound image documentation was performed. After securing guidewire the venous access was dilated. A 13 French, triple lumen Mahurkar non tunneled dialysis catheter was advanced over the wire. Final catheter positioning was confirmed and documented with a fluoroscopic spot image. The catheter was aspirated, flushed with saline, and injected with appropriate volume heparin dwells. The catheter exit site was secured  with 0-Prolene retention sutures. COMPLICATIONS: None.  No pneumothorax. FINDINGS: After catheter placement, the tip lies at the cavoatrial junction. The catheter aspirates normally and is ready for immediate use. IMPRESSION: Placement of non-tunneled central venous hemodialysis catheter via the right internal jugular vein. The catheter tip lies at the cavoatrial junction. The catheter is ready for immediate use. Electronically Signed   By: Aletta Edouard M.D.   On: 07/21/2018 16:59   [name] Ekg Site Rite  Result Date: 07/21/2018 If Site Rite image not attached, placement could not be confirmed due to current cardiac rhythm.    Medications:   . sodium chloride 10 mL/hr (07/22/18 0331)  . sodium chloride    . sodium chloride     . amLODipine  10 mg Oral QHS  . calcitRIOL   0.75 mcg Oral Daily  . carvedilol  12.5 mg Oral Daily  . chlorhexidine  60 mL Topical Once   And  . [START ON 07/23/2018] chlorhexidine  60 mL Topical Once  . Chlorhexidine Gluconate Cloth  6 each Topical Q0600  . [START ON 07/23/2018] clopidogrel  75 mg Oral Daily  . heparin  5,000 Units Subcutaneous Q8H  . insulin aspart  0-9 Units Subcutaneous TID WC  . insulin glargine  28 Units Subcutaneous Q lunch  . simvastatin  5 mg Oral QHS   sodium chloride, sodium chloride, acetaminophen **OR** acetaminophen, alteplase, diphenhydrAMINE, gabapentin, heparin, ketorolac, lidocaine (PF), lidocaine-prilocaine, ondansetron **OR** ondansetron (ZOFRAN) IV, pentafluoroprop-tetrafluoroeth, sodium chloride flush, zolpidem  Assessment/ Plan:   ESRD  New start  Dialysis #1 7/26  Dialysis #2 7/27, she had a temporary catheter placed 7/26 that will need converting to a tunneled dialysis catheter  Anemia is stable  10.7 not issues at this point  Hypertension continue to challenge dry weight  Bones  Controlled with no binders  Diabetes  Per primary   Hyperlipidemia     LOS: 1 Heather [name] W @TODAY @11 :31 AM

## 2018-07-22 NOTE — Progress Notes (Signed)
Per Dr Marthenia Rolling, paged vascular on call to check on plan for pt since K is at appropriate level this am

## 2018-07-22 NOTE — Progress Notes (Signed)
   VASCULAR SURGERY ASSESSMENT & PLAN:   This patient was scheduled for a redo left upper arm graft yesterday.  This was canceled because of hyperkalemia.  Interventional radiology placed a temporary dialysis catheter.  She was dialyzed yesterday.  I have scheduled her for Monday to have a new left upper arm graft placed and exchange of her temporary catheter for a tunneled dialysis catheter.  NEPHROLOGY: Please be sure that the patient is not dialyzed on Monday as she is scheduled for surgery.  Thank you.  SUBJECTIVE:   No complaints.  PHYSICAL EXAM:   Vitals:   07/21/18 2055 07/21/18 2102 07/21/18 2155 07/22/18 0605  BP: 130/83 (!) 156/92 (!) 144/83 133/66  Pulse: 79 82 86 79  Resp: 12 11 18 16   Temp:  98 F (36.7 C) 97.6 F (36.4 C) 97.9 F (36.6 C)  TempSrc:  Oral Oral Oral  SpO2: 98% 98% 99% 94%  Weight:  235 lb 7.2 oz (106.8 kg)  231 lb 4.2 oz (104.9 kg)  Height:       Temporary dialysis catheter in right IJ.  LABS:   Lab Results  Component Value Date   WBC 8.1 07/21/2018   HGB 10.7 (L) 07/21/2018   HCT 35.1 (L) 07/21/2018   MCV 97.2 07/21/2018   PLT 194 07/21/2018   Lab Results  Component Value Date   CREATININE 5.60 (H) 07/22/2018   Lab Results  Component Value Date   INR 1.01 07/11/2018   CBG (last 3)  Recent Labs    07/21/18 1647 07/21/18 2157 07/22/18 0744  GLUCAP 147* 99 119*    PROBLEM LIST:    Principal Problem:   Hyperkalemia Active Problems:   Diabetes mellitus, type II, insulin dependent ([place]), on CGM, followed by Endocrinology, with end organ damage: renal, neuropathy, gastroparesis, retinopathy   Essential hypertension   Gastroparesis due to DM ([place])   Secondary hyperparathyroidism (H[facility])   CKD (chronic kidney disease) stage 5, GFR less than 15 ml/min (H[facility])   Proliferative diabetic retinopathy ([place])   Morbid obesity due to excess calories ([facility])   Grade I diastolic dysfunction   CURRENT MEDS:   . amLODipine  10 mg Oral QHS    . calcitRIOL  0.75 mcg Oral Daily  . carvedilol  12.5 mg Oral Daily  . chlorhexidine  60 mL Topical Once   And  . chlorhexidine  60 mL Topical Once  . Chlorhexidine Gluconate Cloth  6 each Topical Q0600  . [START ON 07/23/2018] clopidogrel  75 mg Oral Daily  . heparin  5,000 Units Subcutaneous Q8H  . insulin aspart  0-9 Units Subcutaneous TID WC  . insulin glargine  28 Units Subcutaneous Q lunch  . simvastatin  5 mg Oral QHS    Deitra Mayo Beeper: [phone] Office: [phone] 07/22/2018

## 2018-07-22 NOTE — Progress Notes (Signed)
Spoke to Dr Scot Dock on call for vascular, he will review chart and advise if pt may eat or remain NPO

## 2018-07-22 NOTE — Progress Notes (Signed)
PROGRESS NOTE    Heather [name]  JYN:[id number] DOB: [birth year] DOA: 07/21/2018 PCP: Briscoe Deutscher, DO  Outpatient Specialists:     Brief Narrative:  Patient is a 47 year old African-American female with past medical history significant for hypertension, diabetes mellitus that is complicated by neuropathy, gastroparesis and retinopathy; grade 1 diastolic dysfunction, secondary hyperparathyroidism and obesity.  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  Apparently, with patient presented to the hospital for AVG placement, but was noted to have significant hyperkalemia (potassium of 6.6).  Temporary hemo-dialysis access was established and the patient underwent first hemodialysis yesterday.  The plan is to dialyze patient today, and on Monday prior to new AVG placement.  Nephrology input is appreciated.  The potassium is down to 4.9.    Assessment & Plan:   Principal Problem:   Hyperkalemia Active Problems:   Diabetes mellitus, type II, insulin dependent ([place]), on CGM, followed by Endocrinology, with end organ damage: renal, neuropathy, gastroparesis, retinopathy   Essential hypertension   Gastroparesis due to DM ([place])   Secondary hyperparathyroidism ([place])   CKD (chronic kidney disease) stage 5, GFR less than 15 ml/min (HCC)   Proliferative diabetic retinopathy ([place])   Morbid obesity due to excess calories (HCC)   Grade I diastolic dysfunction    Hyperkalemia:  Patient with a potassium of 6.6 and a creatinine of 6.7.  Will be unable to adequately remove this potassium without hemodialysis.  She will have a temporary venous access catheter placed today by interventional radiology.  I have given her IV insulin, dextrose, and calcium gluconate as a stat order.  [name] also going to receive sodium polystyrene 30 g orally every 4 hours for 3 doses.  Is pending expected dialysis today. 07/22/2018: Resolved with hemodialysis.   Potassium is down to 4.9.   ESRD now on hemodialysis:  Patient had presented for permanent vascular access placement but unfortunately that will be unable to be done today due to her potassium levels.  She will have a break catheter placed for dialysis today and will proceed to dialysis.  Dr. [name] has already discussed with Dr. Justin Mend.  Dr. Justin Mend will see the patient in consultation. -For hemodialysis today and on Monday.  Diabetes mellitus type 2 with endorgan damage including renal, neurological, gastric, and retinopathy.   We will monitor patient's blood glucoses closely and treat conservatively given her renal failure. 07/22/2018: Continue to optimize.  4.  Grade 1 diastolic dysfunction with preserved ejection fraction: Continue blood pressure management.  Patient with evidence of left ventricular hypertrophy on echocardiogram from February 2019.  5.  Essential hypertension: Continue blood pressure management which should improve once dialysis started.  6.  Gastroparesis due to diabetes: Continue home medication and treatments.  7.  Secondary hyperparathyroidism: Continue home treatments and binders as necessary.  8.  Proliferative diabetic retinopathy continue home medications and treatments.  9.  Morbid obesity due to excess calories: BMI currently 34.  Continue with dietary management.  DVT prophylaxis: Subcu heparin Code Status: Full code Family Communication:  Sister Disposition Plan: Likely home Consults called: Nephrology Dr. Justin Mend, vascular Dr. [name] Admission status: Inpatient   Procedures:   AVG placement is planned.  Patient is not undergoing hemodialysis  Temporary hemodialysis access placed  Antimicrobials:   None   Subjective: No new complaints.  No shortness of breath.  Objective: Vitals:   07/21/18 2102 07/21/18 2155 07/22/18 0605 07/22/18 1134  BP: (!) 156/92 (!) 144/83 133/66 [name])  169/91  Pulse: 82 86 79 86  Resp: 11 18 16 20   Temp: 98 F  (36.7 C) 97.6 F (36.4 C) 97.9 F (36.6 C) 97.7 F (36.5 C)  TempSrc: Oral Oral Oral Oral  SpO2: 98% 99% 94% 100%  Weight: 106.8 kg (235 lb 7.2 oz)  104.9 kg (231 lb 4.2 oz)   Height:        Intake/Output Summary (Last 24 hours) at 07/22/2018 1526 Last data filed at 07/22/2018 1400 Gross per 24 hour  Intake 897.21 ml  Output 2000 ml  Net -1102.79 ml   Filed Weights   07/21/18 1845 07/21/18 2102 07/22/18 0605  Weight: 108.8 kg (239 lb 13.8 oz) 106.8 kg (235 lb 7.2 oz) 104.9 kg (231 lb 4.2 oz)    Examination:  General exam: Obese.  Appears calm and comfortable  Respiratory system: Clear to auscultation. Respiratory effort normal. Cardiovascular system: S1 & S2 heard. Gastrointestinal system: Abdomen is nondistended, soft and nontender. No organomegaly or masses felt. Normal bowel sounds heard. Central nervous system: Alert and oriented. No focal neurological deficits. Extremities: Bilateral leg edema.   Psychiatry: Judgement and insight appear normal. Mood & affect appropriate.     Data Reviewed: I have personally reviewed following labs and imaging studies  CBC: Recent Labs  Lab 07/21/18 0711 07/21/18 0733 07/21/18 1051  WBC  --   --  8.1  HGB 12.9 12.9 10.7*  HCT 38.0 38.0 35.1*  MCV  --   --  97.2  PLT  --   --  295   Basic Metabolic Panel: Recent Labs  Lab 07/21/18 0711 07/21/18 0733 07/21/18 0942 07/22/18 0309  NA 137 138 137 137  K 6.3* 6.6* 6.7* 4.9  CL 109  --  111 107  CO2  --   --  16* 25  GLUCOSE 185* 197* 200* 127*  BUN 49*  --  53* 43*  CREATININE 6.70*  --  6.13*  6.27* 5.60*  CALCIUM  --   --  8.1* 8.1*  PHOS  --   --  4.8*  --    GFR: Estimated Creatinine Clearance: 15.6 mL/min (A) (by C-G formula based on SCr of 5.6 mg/dL (H)). Liver Function Tests: Recent Labs  Lab 07/21/18 0942 07/21/18 1931  ALT  --  19  ALBUMIN 2.7*  --    No results for input(s): LIPASE, AMYLASE in the last 168 hours. No results for input(s): AMMONIA in  the last 168 hours. Coagulation Profile: No results for input(s): INR, PROTIME in the last 168 hours. Cardiac Enzymes: No results for input(s): CKTOTAL, CKMB, CKMBINDEX, TROPONINI in the last 168 hours. BNP (last 3 results) Recent Labs    01/02/18 0825 04/11/18 1421  PROBNP 32.0 65.0   HbA1C: No results for input(s): HGBA1C in the last 72 hours. CBG: Recent Labs  Lab 07/21/18 1159 07/21/18 1647 07/21/18 2157 07/22/18 0744 07/22/18 1136  GLUCAP 104* 147* 99 119* 162*   Lipid Profile: No results for input(s): CHOL, HDL, LDLCALC, TRIG, CHOLHDL, LDLDIRECT in the last 72 hours. Thyroid Function Tests: No results for input(s): TSH, T4TOTAL, FREET4, T3FREE, THYROIDAB in the last 72 hours. Anemia Panel: No results for input(s): VITAMINB12, FOLATE, FERRITIN, TIBC, IRON, RETICCTPCT in the last 72 hours. Urine analysis:    Component Value Date/Time   COLORURINE STRAW (A) 07/11/2018 2004   APPEARANCEUR HAZY (A) 07/11/2018 2004   LABSPEC 1.008 07/11/2018 2004   PHURINE 7.0 07/11/2018 2004   GLUCOSEU 50 (A) 07/11/2018 2004  HGBUR NEGATIVE 07/11/2018 2004   BILIRUBINUR NEGATIVE 07/11/2018 2004   BILIRUBINUR Negative 07/29/2017 0802   BILIRUBINUR neg 08/08/2013   KETONESUR NEGATIVE 07/11/2018 2004   PROTEINUR >=300 (A) 07/11/2018 2004   UROBILINOGEN 0.2 07/29/2017 0802   UROBILINOGEN 0.2 09/07/2015 1230   NITRITE NEGATIVE 07/11/2018 2004   LEUKOCYTESUR NEGATIVE 07/11/2018 2004   Sepsis Labs: @LABRCNTIP (procalcitonin:4,lacticidven:4)  )No results found for this or any previous visit (from the past 240 hour(s)).       Radiology Studies: Ir Fluoro Guide Cv Line Right  Result Date: 07/21/2018 CLINICAL DATA:  Renal failure and need for hemodialysis due to hyperkalemia. EXAM: NON-TUNNELED CENTRAL VENOUS CATHETER PLACEMENT WITH ULTRASOUND AND FLUOROSCOPIC GUIDANCE FLUOROSCOPY TIME:  18 seconds.  5.4 mGy. PROCEDURE: The procedure, risks, benefits, and alternatives were explained  to the patient. Questions regarding the procedure were encouraged and answered. The patient understands and consents to the procedure. A time-out was performed prior to initiating the procedure. The right neck and chest were prepped with chlorhexidine in a sterile fashion, and a sterile drape was applied covering the operative field. Maximum barrier sterile technique with sterile gowns and gloves were used for the procedure. Local anesthesia was provided with 1% lidocaine. Ultrasound was used to confirm patency of the right internal jugular vein. After creating a small venotomy incision, a 21 gauge needle was advanced into the right internal jugular vein under direct, real-time ultrasound guidance. Ultrasound image documentation was performed. After securing guidewire the venous access was dilated. A 13 French, triple lumen Mahurkar non tunneled dialysis catheter was advanced over the wire. Final catheter positioning was confirmed and documented with a fluoroscopic spot image. The catheter was aspirated, flushed with saline, and injected with appropriate volume heparin dwells. The catheter exit site was secured with 0-Prolene retention sutures. COMPLICATIONS: None.  No pneumothorax. FINDINGS: After catheter placement, the tip lies at the cavoatrial junction. The catheter aspirates normally and is ready for immediate use. IMPRESSION: Placement of non-tunneled central venous hemodialysis catheter via the right internal jugular vein. The catheter tip lies at the cavoatrial junction. The catheter is ready for immediate use. Electronically Signed   By: Aletta Edouard M.D.   On: 07/21/2018 16:59   Ir US Guide Vasc Access Right  Result Date: 07/21/2018 CLINICAL DATA:  Renal failure and need for hemodialysis due to hyperkalemia. EXAM: NON-TUNNELED CENTRAL VENOUS CATHETER PLACEMENT WITH ULTRASOUND AND FLUOROSCOPIC GUIDANCE FLUOROSCOPY TIME:  18 seconds.  5.4 mGy. PROCEDURE: The procedure, risks, benefits, and  alternatives were explained to the patient. Questions regarding the procedure were encouraged and answered. The patient understands and consents to the procedure. A time-out was performed prior to initiating the procedure. The right neck and chest were prepped with chlorhexidine in a sterile fashion, and a sterile drape was applied covering the operative field. Maximum barrier sterile technique with sterile gowns and gloves were used for the procedure. Local anesthesia was provided with 1% lidocaine. Ultrasound was used to confirm patency of the right internal jugular vein. After creating a small venotomy incision, a 21 gauge needle was advanced into the right internal jugular vein under direct, real-time ultrasound guidance. Ultrasound image documentation was performed. After securing guidewire the venous access was dilated. A 13 French, triple lumen Mahurkar non tunneled dialysis catheter was advanced over the wire. Final catheter positioning was confirmed and documented with a fluoroscopic spot image. The catheter was aspirated, flushed with saline, and injected with appropriate volume heparin dwells. The catheter exit site was secured with 0-Prolene retention  sutures. COMPLICATIONS: None.  No pneumothorax. FINDINGS: After catheter placement, the tip lies at the cavoatrial junction. The catheter aspirates normally and is ready for immediate use. IMPRESSION: Placement of non-tunneled central venous hemodialysis catheter via the right internal jugular vein. The catheter tip lies at the cavoatrial junction. The catheter is ready for immediate use. Electronically Signed   By: Aletta Edouard M.D.   On: 07/21/2018 16:59   [name] Ekg Site Rite  Result Date: 07/21/2018 If Site Rite image not attached, placement could not be confirmed due to current cardiac rhythm.       Scheduled Meds: . amLODipine  10 mg Oral QHS  . calcitRIOL  0.75 mcg Oral Daily  . carvedilol  12.5 mg Oral Daily  . chlorhexidine  60 mL  Topical Once   And  . [START ON 07/23/2018] chlorhexidine  60 mL Topical Once  . Chlorhexidine Gluconate Cloth  6 each Topical Q0600  . [START ON 07/23/2018] clopidogrel  75 mg Oral Daily  . heparin  5,000 Units Subcutaneous Q8H  . insulin aspart  0-9 Units Subcutaneous TID WC  . insulin glargine  28 Units Subcutaneous Q lunch  . simvastatin  5 mg Oral QHS   Continuous Infusions: . sodium chloride 10 mL/hr (07/22/18 0331)  . sodium chloride    . sodium chloride       LOS: 1 day    Time spent: 25 minutes.    Dana Allan, MD  [facility] Pager #: [phone] 7PM-7AM contact night coverage as above

## 2018-07-22 NOTE — Progress Notes (Signed)
Late entry for 1745 Patient reported Itching with her first HD treatment.  Dialyzer double rinsed, pt still reported itching, benadryl 25 mg po given, MD aware

## 2018-07-23 LAB — SURGICAL PCR SCREEN
MRSA, PCR: NEGATIVE
Staphylococcus aureus: NEGATIVE

## 2018-07-23 LAB — GLUCOSE, CAPILLARY
GLUCOSE-CAPILLARY: 105 mg/dL — AB (ref 70–99)
GLUCOSE-CAPILLARY: 141 mg/dL — AB (ref 70–99)
Glucose-Capillary: 199 mg/dL — ABNORMAL HIGH (ref 70–99)
Glucose-Capillary: 125 mg/dL — ABNORMAL HIGH (ref 70–99)

## 2018-07-23 MED ORDER — CHLORHEXIDINE GLUCONATE 4 % EX LIQD: 60.0000 mL | Freq: Once | CUTANEOUS | Status: DC

## 2018-07-23 MED ORDER — CEFAZOLIN SODIUM-DEXTROSE 2-4 GM/100ML-% IV SOLN
2.0000 g | INTRAVENOUS | Status: AC
  Administered 2018-07-24: 2 g via INTRAVENOUS
  Filled 2018-07-23 (×2): qty 100

## 2018-07-23 MED ORDER — CEFAZOLIN SODIUM-DEXTROSE 1-4 GM/50ML-% IV SOLN: 1.0000 g | INTRAVENOUS | Status: DC

## 2018-07-23 MED ORDER — CHLORHEXIDINE GLUCONATE CLOTH 2 % EX PADS
6.0000 | MEDICATED_PAD | Freq: Every day | CUTANEOUS | Status: DC
  Administered 2018-07-23: 6 via TOPICAL

## 2018-07-23 NOTE — Progress Notes (Signed)
PROGRESS NOTE    Heather [name]  AYT:[id number] DOB: 27-Apr-1971 DOA: 07/21/2018 PCP: Briscoe Deutscher, DO  Outpatient Specialists:     Brief Narrative:  Patient is a 47 year old African-American female with past medical history significant for hypertension, diabetes mellitus that is complicated by neuropathy, gastroparesis and retinopathy; grade 1 diastolic dysfunction, secondary hyperparathyroidism and obesity.  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  Apparently, with patient presented to the hospital for AVG placement, but was noted to have significant hyperkalemia (potassium of 6.6).  Temporary hemo-dialysis access was established and the patient underwent first hemodialysis yesterday.  The plan is to dialyze patient today, and on Monday prior to new AVG placement.  Nephrology input is appreciated.  The potassium is down to 4.9.  07/23/2018: Patient seen.  No new complaints.  Check renal panel in a.m.  For hemodialysis in a.m.  For AVG placement by the vascular team in the morning.   Assessment & Plan:   Principal Problem:   Hyperkalemia Active Problems:   Diabetes mellitus, type II, insulin dependent ([place]), on CGM, followed by Endocrinology, with end organ damage: renal, neuropathy, gastroparesis, retinopathy   Essential hypertension   Gastroparesis due to DM ([place])   Secondary hyperparathyroidism ([place])   CKD (chronic kidney disease) stage 5, GFR less than 15 ml/min (HCC)   Proliferative diabetic retinopathy ([place])   Morbid obesity due to excess calories (HCC)   Grade I diastolic dysfunction    Hyperkalemia:  Patient with a potassium of 6.6 and a creatinine of 6.7.  Will be unable to adequately remove this potassium without hemodialysis.  She will have a temporary venous access catheter placed today by interventional radiology.  I have given her IV insulin, dextrose, and calcium gluconate as a stat order.  [name] also going  to receive sodium polystyrene 30 g orally every 4 hours for 3 doses.  Is pending expected dialysis today. 07/22/2018: Resolved with hemodialysis.  Potassium is down to 4.9. 07/23/2018: Repeat renal panel in a.m.   ESRD now on hemodialysis:  Patient had presented for permanent vascular access placement but unfortunately that will be unable to be done today due to her potassium levels.  She will have a break catheter placed for dialysis today and will proceed to dialysis.  Dr. [name] has already discussed with Dr. Justin Mend.  Dr. Justin Mend will see the patient in consultation. -For hemodialysis today and on Monday.  Diabetes mellitus type 2 with endorgan damage including renal, neurological, gastric, and retinopathy.   We will monitor patient's blood glucoses closely and treat conservatively given her renal failure. 07/22/2018: Continue to optimize.  Grade 1 diastolic dysfunction with preserved ejection fraction:  - Continue blood pressure management.  Patient with evidence of left ventricular hypertrophy on echocardiogram from February 2019.  Essential hypertension:  Continue to optimize.   Blood pressure should improve with hemodialysis.   Currently, the patient is on amlodipine and Coreg,  Morbid obesity: Diet and exercise.     DVT prophylaxis: Subcu heparin Code Status: Full code Family Communication:  Sister Disposition Plan: Likely home Consults called: Nephrology Dr. Justin Mend, vascular Dr. [name] Admission status: Inpatient   Procedures:   AVG placement is planned.  Patient is not undergoing hemodialysis  Temporary hemodialysis access placed  Antimicrobials:   None   Subjective: No new complaints.  No shortness of breath.  Objective: Vitals:   07/23/18 0612 07/23/18 0906 07/23/18 0955 07/23/18 1150  BP: 127/76  (!) 141/89  136/69  Pulse: 83  87 85  Resp: 18   18  Temp: 97.8 F (36.6 C)  98.2 F (36.8 C) 98.2 F (36.8 C)  TempSrc: Oral  Oral Oral  SpO2: 95%  100% 99%    Weight:  103 kg (227 lb 1.6 oz)    Height:        Intake/Output Summary (Last 24 hours) at 07/23/2018 1349 Last data filed at 07/23/2018 1323 Gross per 24 hour  Intake 940 ml  Output 3000 ml  Net -2060 ml   Filed Weights   07/22/18 1639 07/22/18 1911 07/23/18 0906  Weight: 104.9 kg (231 lb 4.2 oz) 104.9 kg (231 lb 4.2 oz) 103 kg (227 lb 1.6 oz)    Examination:  General exam: Obese.  Appears calm and comfortable  Respiratory system: Clear to auscultation. Respiratory effort normal. Cardiovascular system: S1 & S2 heard. Gastrointestinal system: Abdomen is nondistended, soft and nontender. No organomegaly or masses felt. Normal bowel sounds heard. Central nervous system: Alert and oriented. No focal neurological deficits. Extremities: Bilateral leg edema.   Psychiatry: Judgement and insight appear normal. Mood & affect appropriate.     Data Reviewed: I have personally reviewed following labs and imaging studies  CBC: Recent Labs  Lab 07/21/18 0711 07/21/18 0733 07/21/18 1051  WBC  --   --  8.1  HGB 12.9 12.9 10.7*  HCT 38.0 38.0 35.1*  MCV  --   --  97.2  PLT  --   --  102   Basic Metabolic Panel: Recent Labs  Lab 07/21/18 0711 07/21/18 0733 07/21/18 0942 07/22/18 0309  NA 137 138 137 137  K 6.3* 6.6* 6.7* 4.9  CL 109  --  111 107  CO2  --   --  16* 25  GLUCOSE 185* 197* 200* 127*  BUN 49*  --  53* 43*  CREATININE 6.70*  --  6.13*  6.27* 5.60*  CALCIUM  --   --  8.1* 8.1*  PHOS  --   --  4.8*  --    GFR: Estimated Creatinine Clearance: 15.4 mL/min (A) (by C-G formula based on SCr of 5.6 mg/dL (H)). Liver Function Tests: Recent Labs  Lab 07/21/18 0942 07/21/18 1931  ALT  --  19  ALBUMIN 2.7*  --    No results for input(s): LIPASE, AMYLASE in the last 168 hours. No results for input(s): AMMONIA in the last 168 hours. Coagulation Profile: No results for input(s): INR, PROTIME in the last 168 hours. Cardiac Enzymes: No results for input(s): CKTOTAL,  CKMB, CKMBINDEX, TROPONINI in the last 168 hours. BNP (last 3 results) Recent Labs    01/02/18 0825 04/11/18 1421  PROBNP 32.0 65.0   HbA1C: No results for input(s): HGBA1C in the last 72 hours. CBG: Recent Labs  Lab 07/22/18 0744 07/22/18 1136 07/22/18 2053 07/23/18 0728 07/23/18 1149  GLUCAP 119* 162* 162* 105* 141*   Lipid Profile: No results for input(s): CHOL, HDL, LDLCALC, TRIG, CHOLHDL, LDLDIRECT in the last 72 hours. Thyroid Function Tests: No results for input(s): TSH, T4TOTAL, FREET4, T3FREE, THYROIDAB in the last 72 hours. Anemia Panel: No results for input(s): VITAMINB12, FOLATE, FERRITIN, TIBC, IRON, RETICCTPCT in the last 72 hours. Urine analysis:    Component Value Date/Time   COLORURINE STRAW (A) 07/11/2018 2004   APPEARANCEUR HAZY (A) 07/11/2018 2004   LABSPEC 1.008 07/11/2018 2004   PHURINE 7.0 07/11/2018 2004   GLUCOSEU 50 (A) 07/11/2018 2004   HGBUR NEGATIVE 07/11/2018 2004  BILIRUBINUR NEGATIVE 07/11/2018 2004   BILIRUBINUR Negative 07/29/2017 0802   BILIRUBINUR neg 08/08/2013   KETONESUR NEGATIVE 07/11/2018 2004   PROTEINUR >=300 (A) 07/11/2018 2004   UROBILINOGEN 0.2 07/29/2017 0802   UROBILINOGEN 0.2 09/07/2015 1230   NITRITE NEGATIVE 07/11/2018 2004   LEUKOCYTESUR NEGATIVE 07/11/2018 2004   Sepsis Labs: @LABRCNTIP (procalcitonin:4,lacticidven:4)  )No results found for this or any previous visit (from the past 240 hour(s)).       Radiology Studies: Ir Fluoro Guide Cv Line Right  Result Date: 07/21/2018 CLINICAL DATA:  Renal failure and need for hemodialysis due to hyperkalemia. EXAM: NON-TUNNELED CENTRAL VENOUS CATHETER PLACEMENT WITH ULTRASOUND AND FLUOROSCOPIC GUIDANCE FLUOROSCOPY TIME:  18 seconds.  5.4 mGy. PROCEDURE: The procedure, risks, benefits, and alternatives were explained to the patient. Questions regarding the procedure were encouraged and answered. The patient understands and consents to the procedure. A time-out was  performed prior to initiating the procedure. The right neck and chest were prepped with chlorhexidine in a sterile fashion, and a sterile drape was applied covering the operative field. Maximum barrier sterile technique with sterile gowns and gloves were used for the procedure. Local anesthesia was provided with 1% lidocaine. Ultrasound was used to confirm patency of the right internal jugular vein. After creating a small venotomy incision, a 21 gauge needle was advanced into the right internal jugular vein under direct, real-time ultrasound guidance. Ultrasound image documentation was performed. After securing guidewire the venous access was dilated. A 13 French, triple lumen Mahurkar non tunneled dialysis catheter was advanced over the wire. Final catheter positioning was confirmed and documented with a fluoroscopic spot image. The catheter was aspirated, flushed with saline, and injected with appropriate volume heparin dwells. The catheter exit site was secured with 0-Prolene retention sutures. COMPLICATIONS: None.  No pneumothorax. FINDINGS: After catheter placement, the tip lies at the cavoatrial junction. The catheter aspirates normally and is ready for immediate use. IMPRESSION: Placement of non-tunneled central venous hemodialysis catheter via the right internal jugular vein. The catheter tip lies at the cavoatrial junction. The catheter is ready for immediate use. Electronically Signed   By: Aletta Edouard M.D.   On: 07/21/2018 16:59   Ir US Guide Vasc Access Right  Result Date: 07/21/2018 CLINICAL DATA:  Renal failure and need for hemodialysis due to hyperkalemia. EXAM: NON-TUNNELED CENTRAL VENOUS CATHETER PLACEMENT WITH ULTRASOUND AND FLUOROSCOPIC GUIDANCE FLUOROSCOPY TIME:  18 seconds.  5.4 mGy. PROCEDURE: The procedure, risks, benefits, and alternatives were explained to the patient. Questions regarding the procedure were encouraged and answered. The patient understands and consents to the  procedure. A time-out was performed prior to initiating the procedure. The right neck and chest were prepped with chlorhexidine in a sterile fashion, and a sterile drape was applied covering the operative field. Maximum barrier sterile technique with sterile gowns and gloves were used for the procedure. Local anesthesia was provided with 1% lidocaine. Ultrasound was used to confirm patency of the right internal jugular vein. After creating a small venotomy incision, a 21 gauge needle was advanced into the right internal jugular vein under direct, real-time ultrasound guidance. Ultrasound image documentation was performed. After securing guidewire the venous access was dilated. A 13 French, triple lumen Mahurkar non tunneled dialysis catheter was advanced over the wire. Final catheter positioning was confirmed and documented with a fluoroscopic spot image. The catheter was aspirated, flushed with saline, and injected with appropriate volume heparin dwells. The catheter exit site was secured with 0-Prolene retention sutures. COMPLICATIONS: None.  No pneumothorax.  FINDINGS: After catheter placement, the tip lies at the cavoatrial junction. The catheter aspirates normally and is ready for immediate use. IMPRESSION: Placement of non-tunneled central venous hemodialysis catheter via the right internal jugular vein. The catheter tip lies at the cavoatrial junction. The catheter is ready for immediate use. Electronically Signed   By: Aletta Edouard M.D.   On: 07/21/2018 16:59        Scheduled Meds: . amLODipine  10 mg Oral QHS  . calcitRIOL  0.75 mcg Oral Daily  . carvedilol  12.5 mg Oral Daily  . chlorhexidine  60 mL Topical Once   And  . [START ON 07/24/2018] chlorhexidine  60 mL Topical Once  . Chlorhexidine Gluconate Cloth  6 each Topical Q0600  . clopidogrel  75 mg Oral Daily  . heparin  5,000 Units Subcutaneous Q8H  . insulin aspart  0-9 Units Subcutaneous TID WC  . insulin glargine  28 Units  Subcutaneous Q lunch  . simvastatin  5 mg Oral QHS   Continuous Infusions: . sodium chloride Stopped (07/23/18 0618)  . sodium chloride    . sodium chloride    . [START ON 07/24/2018]  ceFAZolin (ANCEF) IV       LOS: 2 days    Time spent: 25 minutes.    Dana Allan, MD  [facility] Pager #: [phone] 7PM-7AM contact night coverage as above

## 2018-07-23 NOTE — Plan of Care (Signed)

## 2018-07-23 NOTE — Progress Notes (Signed)
[facility] ROUNDING NOTE   Subjective:   No complaints last night  Appreciate Dr Nicole Cella assistance, she is to have a new AVG placed Monday and requested that dialysis is performed early Monday to facilitate surgery. Dialysis yesterday was uneventful with removal of 3 L will plan dialysis   Monday to optimize patient preop.   Objective:  Vital signs in last 24 hours:  Temp:  [97.8 F (36.6 C)-98.4 F (36.9 C)] 98.2 F (36.8 C) (07/28 0955) Pulse Rate:  [83-91] 87 (07/28 0955) Resp:  [17-18] 18 (07/28 0612) BP: (127-177)/(76-95) 141/89 (07/28 0955) SpO2:  [95 %-100 %] 100 % (07/28 0955) Weight:  [227 lb 1.6 oz (103 kg)-231 lb 4.2 oz (104.9 kg)] 227 lb 1.6 oz (103 kg) (07/28 0906)  Weight change: 4 lb 4.2 oz (1.933 kg) Filed Weights   07/22/18 1639 07/22/18 1911 07/23/18 0906  Weight: 231 lb 4.2 oz (104.9 kg) 231 lb 4.2 oz (104.9 kg) 227 lb 1.6 oz (103 kg)    Intake/Output: I/O last 3 completed shifts: In: 843.8 [P.O.:720; I.V.:123.8] Out: 5000 [Other:5000]   Intake/Output this shift:  Total I/O In: 340 [P.O.:340] Out: 0   CVS- RRR RS- CTA temp L IJ   ABD- BS present soft non-distended EXT- no edema   Basic Metabolic Panel: Recent Labs  Lab 07/21/18 0711 07/21/18 0733 07/21/18 0942 07/22/18 0309  NA 137 138 137 137  K 6.3* 6.6* 6.7* 4.9  CL 109  --  111 107  CO2  --   --  16* 25  GLUCOSE 185* 197* 200* 127*  BUN 49*  --  53* 43*  CREATININE 6.70*  --  6.13*  6.27* 5.60*  CALCIUM  --   --  8.1* 8.1*  PHOS  --   --  4.8*  --     Liver Function Tests: Recent Labs  Lab 07/21/18 0942 07/21/18 1931  ALT  --  19  ALBUMIN 2.7*  --    No results for input(s): LIPASE, AMYLASE in the last 168 hours. No results for input(s): AMMONIA in the last 168 hours.  CBC: Recent Labs  Lab 07/21/18 0711 07/21/18 0733 07/21/18 1051  WBC  --   --  8.1  HGB 12.9 12.9 10.7*  HCT 38.0 38.0 35.1*  MCV  --   --  97.2  PLT  --   --  194    Cardiac  Enzymes: No results for input(s): CKTOTAL, CKMB, CKMBINDEX, TROPONINI in the last 168 hours.  BNP: Invalid input(s): POCBNP  CBG: Recent Labs  Lab 07/21/18 2157 07/22/18 0744 07/22/18 1136 07/22/18 2053 07/23/18 0728  GLUCAP 99 119* 162* 162* 105*    Microbiology: Results for orders placed or performed during the hospital encounter of 12/02/17  Culture, Urine     Status: Abnormal   Collection Time: 12/06/17  4:31 PM  Result Value Ref Range Status   Specimen Description URINE, CLEAN CATCH  Final   Special Requests NONE  Final   Culture >=100,000 COLONIES/mL KLEBSIELLA PNEUMONIAE (A)  Final   Report Status 12/08/2017 FINAL  Final   Organism ID, Bacteria KLEBSIELLA PNEUMONIAE (A)  Final      Susceptibility   Klebsiella pneumoniae - MIC*    AMPICILLIN >=32 RESISTANT Resistant     CEFAZOLIN <=4 SENSITIVE Sensitive     CEFTRIAXONE <=1 SENSITIVE Sensitive     CIPROFLOXACIN <=0.25 SENSITIVE Sensitive     GENTAMICIN <=1 SENSITIVE Sensitive     IMIPENEM <=0.25 SENSITIVE Sensitive  NITROFURANTOIN 64 INTERMEDIATE Intermediate     TRIMETH/SULFA <=20 SENSITIVE Sensitive     AMPICILLIN/SULBACTAM 4 SENSITIVE Sensitive     PIP/TAZO <=4 SENSITIVE Sensitive     Extended ESBL NEGATIVE Sensitive     * >=100,000 COLONIES/mL KLEBSIELLA PNEUMONIAE    Coagulation Studies: No results for input(s): LABPROT, INR in the last 72 hours.  Urinalysis: No results for input(s): COLORURINE, LABSPEC, PHURINE, GLUCOSEU, HGBUR, BILIRUBINUR, KETONESUR, PROTEINUR, UROBILINOGEN, NITRITE, LEUKOCYTESUR in the last 72 hours.  Invalid input(s): APPERANCEUR    Imaging: Ir Fluoro Guide Cv Line Right  Result Date: 07/21/2018 CLINICAL DATA:  Renal failure and need for hemodialysis due to hyperkalemia. EXAM: NON-TUNNELED CENTRAL VENOUS CATHETER PLACEMENT WITH ULTRASOUND AND FLUOROSCOPIC GUIDANCE FLUOROSCOPY TIME:  18 seconds.  5.4 mGy. PROCEDURE: The procedure, risks, benefits, and alternatives were explained  to the patient. Questions regarding the procedure were encouraged and answered. The patient understands and consents to the procedure. A time-out was performed prior to initiating the procedure. The right neck and chest were prepped with chlorhexidine in a sterile fashion, and a sterile drape was applied covering the operative field. Maximum barrier sterile technique with sterile gowns and gloves were used for the procedure. Local anesthesia was provided with 1% lidocaine. Ultrasound was used to confirm patency of the right internal jugular vein. After creating a small venotomy incision, a 21 gauge needle was advanced into the right internal jugular vein under direct, real-time ultrasound guidance. Ultrasound image documentation was performed. After securing guidewire the venous access was dilated. A 13 French, triple lumen Mahurkar non tunneled dialysis catheter was advanced over the wire. Final catheter positioning was confirmed and documented with a fluoroscopic spot image. The catheter was aspirated, flushed with saline, and injected with appropriate volume heparin dwells. The catheter exit site was secured with 0-Prolene retention sutures. COMPLICATIONS: None.  No pneumothorax. FINDINGS: After catheter placement, the tip lies at the cavoatrial junction. The catheter aspirates normally and is ready for immediate use. IMPRESSION: Placement of non-tunneled central venous hemodialysis catheter via the right internal jugular vein. The catheter tip lies at the cavoatrial junction. The catheter is ready for immediate use. Electronically Signed   By: Aletta Edouard M.D.   On: 07/21/2018 16:59   Ir US Guide Vasc Access Right  Result Date: 07/21/2018 CLINICAL DATA:  Renal failure and need for hemodialysis due to hyperkalemia. EXAM: NON-TUNNELED CENTRAL VENOUS CATHETER PLACEMENT WITH ULTRASOUND AND FLUOROSCOPIC GUIDANCE FLUOROSCOPY TIME:  18 seconds.  5.4 mGy. PROCEDURE: The procedure, risks, benefits, and  alternatives were explained to the patient. Questions regarding the procedure were encouraged and answered. The patient understands and consents to the procedure. A time-out was performed prior to initiating the procedure. The right neck and chest were prepped with chlorhexidine in a sterile fashion, and a sterile drape was applied covering the operative field. Maximum barrier sterile technique with sterile gowns and gloves were used for the procedure. Local anesthesia was provided with 1% lidocaine. Ultrasound was used to confirm patency of the right internal jugular vein. After creating a small venotomy incision, a 21 gauge needle was advanced into the right internal jugular vein under direct, real-time ultrasound guidance. Ultrasound image documentation was performed. After securing guidewire the venous access was dilated. A 13 French, triple lumen Mahurkar non tunneled dialysis catheter was advanced over the wire. Final catheter positioning was confirmed and documented with a fluoroscopic spot image. The catheter was aspirated, flushed with saline, and injected with appropriate volume heparin dwells. The catheter exit site  was secured with 0-Prolene retention sutures. COMPLICATIONS: None.  No pneumothorax. FINDINGS: After catheter placement, the tip lies at the cavoatrial junction. The catheter aspirates normally and is ready for immediate use. IMPRESSION: Placement of non-tunneled central venous hemodialysis catheter via the right internal jugular vein. The catheter tip lies at the cavoatrial junction. The catheter is ready for immediate use. Electronically Signed   By: Aletta Edouard M.D.   On: 07/21/2018 16:59     Medications:   . sodium chloride Stopped (07/23/18 0618)  . sodium chloride    . sodium chloride    . [START ON 07/24/2018]  ceFAZolin (ANCEF) IV     . amLODipine  10 mg Oral QHS  . calcitRIOL  0.75 mcg Oral Daily  . carvedilol  12.5 mg Oral Daily  . chlorhexidine  60 mL Topical Once    And  . [START ON 07/24/2018] chlorhexidine  60 mL Topical Once  . Chlorhexidine Gluconate Cloth  6 each Topical Q0600  . Chlorhexidine Gluconate Cloth  6 each Topical Q0600  . clopidogrel  75 mg Oral Daily  . heparin  5,000 Units Subcutaneous Q8H  . insulin aspart  0-9 Units Subcutaneous TID WC  . insulin glargine  28 Units Subcutaneous Q lunch  . simvastatin  5 mg Oral QHS   sodium chloride, sodium chloride, acetaminophen **OR** acetaminophen, alteplase, diphenhydrAMINE, gabapentin, heparin, ketorolac, lidocaine (PF), lidocaine-prilocaine, ondansetron **OR** ondansetron (ZOFRAN) IV, pentafluoroprop-tetrafluoroeth, sodium chloride flush, zolpidem  Assessment/ Plan:   ESRD  New start  Dialysis #1 7/26  Dialysis #2 7/27, she had a temporary catheter placed 7/26 that will need converting to a tunneled dialysis catheter  Plan dialysis #3 7/29  Anemia is stable  10.7 not issues at this point  Hypertension continue to challenge dry weight  Bones  Controlled with no binders  Diabetes  Per primary   Hyperlipidemia     LOS: 2 Heather [name] W @TODAY @11 :49 AM

## 2018-07-23 NOTE — Progress Notes (Signed)
   VASCULAR SURGERY ASSESSMENT & PLAN:   The patient is scheduled for her redo left upper arm graft and placement of a tunneled dialysis catheter tomorrow by Dr. Adele Barthel.  NEPHROLOGY: Can we please be sure that the patient is not on the hemodialysis scheduled tomorrow given that she is scheduled for surgery.  I do not think this will be an issue as she is normally a Tuesday Thursday Saturday dialysis.  Thank you.  I discussed the procedure with the patient and all questions were answered.  [name] agreeable to proceed.  SUBJECTIVE:   No specific complaints.  PHYSICAL EXAM:   Vitals:   07/22/18 1911 07/22/18 2053 07/22/18 2229 07/23/18 0612  BP: 133/84 (!) 168/76 138/77 127/76  Pulse: 84 91 90 83  Resp: 17   18  Temp: 98.4 F (36.9 C) 98.2 F (36.8 C)  97.8 F (36.6 C)  TempSrc: Oral Oral  Oral  SpO2: 100% 100%  95%  Weight: 231 lb 4.2 oz (104.9 kg)     Height:       Lungs clear.  LABS:   Lab Results  Component Value Date   WBC 8.1 07/21/2018   HGB 10.7 (L) 07/21/2018   HCT 35.1 (L) 07/21/2018   MCV 97.2 07/21/2018   PLT 194 07/21/2018   Lab Results  Component Value Date   CREATININE 5.60 (H) 07/22/2018   Lab Results  Component Value Date   INR 1.01 07/11/2018   CBG (last 3)  Recent Labs    07/22/18 1136 07/22/18 2053 07/23/18 0728  GLUCAP 162* 162* 105*    PROBLEM LIST:    Principal Problem:   Hyperkalemia Active Problems:   Diabetes mellitus, type II, insulin dependent ([place]), on CGM, followed by Endocrinology, with end organ damage: renal, neuropathy, gastroparesis, retinopathy   Essential hypertension   Gastroparesis due to DM ([place])   Secondary hyperparathyroidism ([place])   CKD (chronic kidney disease) stage 5, GFR 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 amLODipine  10 mg Oral QHS  . calcitRIOL  0.75 mcg Oral Daily   . carvedilol  12.5 mg Oral Daily  . chlorhexidine  60 mL Topical Once   And  . [START ON 07/24/2018] chlorhexidine  60 mL Topical Once  . Chlorhexidine Gluconate Cloth  6 each Topical Q0600  . clopidogrel  75 mg Oral Daily  . heparin  5,000 Units Subcutaneous Q8H  . insulin aspart  0-9 Units Subcutaneous TID WC  . insulin glargine  28 Units Subcutaneous Q lunch  . simvastatin  5 mg Oral QHS    Deitra Mayo Beeper: [phone] Office: [phone] 07/23/2018

## 2018-07-24 ENCOUNTER — Encounter (HOSPITAL_COMMUNITY)
Admission: AD | Disposition: A | Discharge status: Home / Self Care | Payer: Self-pay | Source: Ambulatory Visit | Attending: Nephrology

## 2018-07-24 ENCOUNTER — Inpatient Hospital Stay (HOSPITAL_COMMUNITY): Payer: Managed Care, Other (non HMO)

## 2018-07-24 ENCOUNTER — Inpatient Hospital Stay (HOSPITAL_COMMUNITY): Payer: Managed Care, Other (non HMO) | Admitting: Anesthesiology

## 2018-07-24 ENCOUNTER — Encounter (HOSPITAL_COMMUNITY): Payer: Self-pay | Admitting: Anesthesiology

## 2018-07-24 DIAGNOSIS — N186 End stage renal disease: Secondary | ICD-10-CM

## 2018-07-24 DIAGNOSIS — N185 Chronic kidney disease, stage 5: Secondary | ICD-10-CM

## 2018-07-24 HISTORY — PX: INSERTION OF DIALYSIS CATHETER: SHX1324

## 2018-07-24 HISTORY — PX: REVISION OF ARTERIOVENOUS GORETEX GRAFT: SHX6073

## 2018-07-24 HISTORY — PX: CENTRAL VENOUS CATHETER INSERTION: SHX401

## 2018-07-24 LAB — CBC
HEMATOCRIT: 31.8 % — AB (ref 36.0–46.0)
HEMOGLOBIN: 9.3 g/dL — AB (ref 12.0–15.0)
MCH: 29.2 pg (ref 26.0–34.0)
MCHC: 29.2 g/dL — ABNORMAL LOW (ref 30.0–36.0)
MCV: 100 fL (ref 78.0–100.0)
PLATELETS: 169 10*3/uL (ref 150–400)
RBC: 3.18 MIL/uL — AB (ref 3.87–5.11)
RDW: 13.9 % (ref 11.5–15.5)
WBC: 7 10*3/uL (ref 4.0–10.5)

## 2018-07-24 LAB — RENAL FUNCTION PANEL
ANION GAP: 8 (ref 5–15)
Albumin: 2.1 g/dL — ABNORMAL LOW (ref 3.5–5.0)
Albumin: 2.2 g/dL — ABNORMAL LOW (ref 3.5–5.0)
Anion gap: 8 (ref 5–15)
BUN: 42 mg/dL — ABNORMAL HIGH (ref 6–20)
BUN: 43 mg/dL — ABNORMAL HIGH (ref 6–20)
CHLORIDE: 105 mmol/L (ref 98–111)
CO2: 26 mmol/L (ref 22–32)
CO2: 26 mmol/L (ref 22–32)
Calcium: 8 mg/dL — ABNORMAL LOW (ref 8.9–10.3)
Calcium: 8.1 mg/dL — ABNORMAL LOW (ref 8.9–10.3)
Chloride: 106 mmol/L (ref 98–111)
Creatinine, Ser: 6.37 mg/dL — ABNORMAL HIGH (ref 0.44–1.00)
Creatinine, Ser: 6.39 mg/dL — ABNORMAL HIGH (ref 0.44–1.00)
GFR calc Af Amer: 8 mL/min — ABNORMAL LOW (ref >60)
GFR calc non Af Amer: 7 mL/min — ABNORMAL LOW (ref >60)
GFR, EST AFRICAN AMERICAN: 8 mL/min — AB (ref >60)
GFR, EST NON AFRICAN AMERICAN: 7 mL/min — AB (ref >60)
Glucose, Bld: 132 mg/dL — ABNORMAL HIGH (ref 70–99)
Glucose, Bld: 117 mg/dL — ABNORMAL HIGH (ref 70–99)
POTASSIUM: 4.7 mmol/L (ref 3.5–5.1)
Phosphorus: 5.8 mg/dL — ABNORMAL HIGH (ref 2.5–4.6)
Phosphorus: 5.5 mg/dL — ABNORMAL HIGH (ref 2.5–4.6)
Potassium: 4.4 mmol/L (ref 3.5–5.1)
Sodium: 140 mmol/L (ref 135–145)
Sodium: 139 mmol/L (ref 135–145)

## 2018-07-24 LAB — GLUCOSE, CAPILLARY
GLUCOSE-CAPILLARY: 89 mg/dL (ref 70–99)
GLUCOSE-CAPILLARY: 152 mg/dL — AB (ref 70–99)
Glucose-Capillary: 91 mg/dL (ref 70–99)
Glucose-Capillary: 209 mg/dL — ABNORMAL HIGH (ref 70–99)

## 2018-07-24 SURGERY — INSERTION OF DIALYSIS CATHETER
Anesthesia: General | Site: Groin | Laterality: Left

## 2018-07-24 MED ORDER — HEMOSTATIC AGENTS (NO CHARGE) OPTIME
TOPICAL | Status: DC | PRN
  Administered 2018-07-24: 1 via TOPICAL

## 2018-07-24 MED ORDER — FENTANYL CITRATE (PF) 100 MCG/2ML IJ SOLN
25.0000 ug | INTRAMUSCULAR | Status: DC | PRN
  Administered 2018-07-24 (×2): 50 ug via INTRAVENOUS
  Administered 2018-07-24: 25 ug via INTRAVENOUS
  Administered 2018-07-24: 50 ug via INTRAVENOUS

## 2018-07-24 MED ORDER — LIDOCAINE-PRILOCAINE 2.5-2.5 % EX CREA
1.0000 "application " | TOPICAL_CREAM | CUTANEOUS | Status: DC | PRN
  Filled 2018-07-24: qty 5

## 2018-07-24 MED ORDER — LIDOCAINE HCL (PF) 1 % IJ SOLN: 5.0000 mL | INTRAMUSCULAR | Status: DC | PRN

## 2018-07-24 MED ORDER — HEPARIN SODIUM (PORCINE) 1000 UNIT/ML IJ SOLN
INTRAMUSCULAR | Status: DC | PRN
  Administered 2018-07-24: 1000 [IU] via INTRAVENOUS

## 2018-07-24 MED ORDER — NEOSTIGMINE METHYLSULFATE 5 MG/5ML IV SOSY
PREFILLED_SYRINGE | INTRAVENOUS | Status: DC | PRN
  Administered 2018-07-24: 3.5 mg via INTRAVENOUS

## 2018-07-24 MED ORDER — SUCCINYLCHOLINE CHLORIDE 20 MG/ML IJ SOLN
INTRAMUSCULAR | Status: DC | PRN
  Administered 2018-07-24: 80 mg via INTRAVENOUS

## 2018-07-24 MED ORDER — DEXAMETHASONE SODIUM PHOSPHATE 10 MG/ML IJ SOLN
INTRAMUSCULAR | Status: AC
  Filled 2018-07-24: qty 1

## 2018-07-24 MED ORDER — OXYCODONE-ACETAMINOPHEN 5-325 MG PO TABS: 1.0000 | ORAL_TABLET | ORAL | Status: DC | PRN

## 2018-07-24 MED ORDER — GLYCOPYRROLATE PF 0.2 MG/ML IJ SOSY
PREFILLED_SYRINGE | INTRAMUSCULAR | Status: AC
  Filled 2018-07-24: qty 4

## 2018-07-24 MED ORDER — SUGAMMADEX SODIUM 200 MG/2ML IV SOLN
INTRAVENOUS | Status: AC
  Filled 2018-07-24: qty 2

## 2018-07-24 MED ORDER — SODIUM CHLORIDE 0.9 % IV SOLN: 100.0000 mL | INTRAVENOUS | Status: DC | PRN

## 2018-07-24 MED ORDER — PROTAMINE SULFATE 10 MG/ML IV SOLN
INTRAVENOUS | Status: AC
  Filled 2018-07-24: qty 50

## 2018-07-24 MED ORDER — HYDROMORPHONE HCL 1 MG/ML IJ SOLN: 1.0000 mg | INTRAMUSCULAR | Status: DC | PRN

## 2018-07-24 MED ORDER — 0.9 % SODIUM CHLORIDE (POUR BTL) OPTIME
TOPICAL | Status: DC | PRN
  Administered 2018-07-24: 1000 mL

## 2018-07-24 MED ORDER — ONDANSETRON HCL 4 MG/2ML IJ SOLN
4.0000 mg | Freq: Four times a day (QID) | INTRAMUSCULAR | Status: AC | PRN
  Administered 2018-07-26: 4 mg via INTRAVENOUS

## 2018-07-24 MED ORDER — LIDOCAINE 2% (20 MG/ML) 5 ML SYRINGE
INTRAMUSCULAR | Status: DC | PRN
  Administered 2018-07-24: 60 mg via INTRAVENOUS

## 2018-07-24 MED ORDER — SODIUM CHLORIDE 0.9 % IV SOLN
INTRAVENOUS | Status: AC
  Filled 2018-07-24: qty 1.2

## 2018-07-24 MED ORDER — FENTANYL CITRATE (PF) 100 MCG/2ML IJ SOLN
INTRAMUSCULAR | Status: AC
  Filled 2018-07-24: qty 2

## 2018-07-24 MED ORDER — PENTAFLUOROPROP-TETRAFLUOROETH EX AERO: 1.0000 "application " | INHALATION_SPRAY | CUTANEOUS | Status: DC | PRN

## 2018-07-24 MED ORDER — SODIUM CHLORIDE 0.9 % IV SOLN: INTRAVENOUS | Status: DC

## 2018-07-24 MED ORDER — PHENYLEPHRINE 40 MCG/ML (10ML) SYRINGE FOR IV PUSH (FOR BLOOD PRESSURE SUPPORT)
PREFILLED_SYRINGE | INTRAVENOUS | Status: DC | PRN
  Administered 2018-07-24 (×4): 80 ug via INTRAVENOUS

## 2018-07-24 MED ORDER — HEPARIN SODIUM (PORCINE) 1000 UNIT/ML IJ SOLN
INTRAMUSCULAR | Status: AC
  Filled 2018-07-24: qty 1

## 2018-07-24 MED ORDER — PROPOFOL 10 MG/ML IV BOLUS
INTRAVENOUS | Status: DC | PRN
  Administered 2018-07-24: 150 mg via INTRAVENOUS

## 2018-07-24 MED ORDER — SODIUM CHLORIDE 0.9 % IV SOLN
INTRAVENOUS | Status: DC | PRN
  Administered 2018-07-24: 13:00:00

## 2018-07-24 MED ORDER — ONDANSETRON HCL 4 MG/2ML IJ SOLN
4.0000 mg | Freq: Four times a day (QID) | INTRAMUSCULAR | Status: AC | PRN
  Administered 2018-07-24: 4 mg via INTRAVENOUS

## 2018-07-24 MED ORDER — LIDOCAINE-EPINEPHRINE 0.5 %-1:200000 IJ SOLN
INTRAMUSCULAR | Status: AC
  Filled 2018-07-24: qty 1

## 2018-07-24 MED ORDER — GLYCOPYRROLATE PF 0.2 MG/ML IJ SOSY
PREFILLED_SYRINGE | INTRAMUSCULAR | Status: DC | PRN
  Administered 2018-07-24: 0.4 mg via INTRAVENOUS

## 2018-07-24 MED ORDER — HEPARIN SODIUM (PORCINE) 1000 UNIT/ML DIALYSIS
1000.0000 [IU] | INTRAMUSCULAR | Status: DC | PRN
  Filled 2018-07-24: qty 1

## 2018-07-24 MED ORDER — NEOSTIGMINE METHYLSULFATE 5 MG/5ML IV SOSY
PREFILLED_SYRINGE | INTRAVENOUS | Status: AC
  Filled 2018-07-24: qty 5

## 2018-07-24 MED ORDER — HYDROMORPHONE HCL 1 MG/ML IJ SOLN
1.0000 mg | INTRAMUSCULAR | Status: DC | PRN
Start: 2018-07-24 — End: 2018-07-26
  Administered 2018-07-24 – 2018-07-25 (×3): 1 mg via INTRAVENOUS
  Filled 2018-07-24 (×4): qty 1

## 2018-07-24 MED ORDER — SODIUM CHLORIDE 0.9 % IV SOLN
INTRAVENOUS | Status: DC | PRN
  Administered 2018-07-24: 30 ug/min via INTRAVENOUS

## 2018-07-24 MED ORDER — MIDAZOLAM HCL 2 MG/2ML IJ SOLN
INTRAMUSCULAR | Status: AC
  Filled 2018-07-24: qty 2

## 2018-07-24 MED ORDER — OXYCODONE HCL 5 MG PO TABS: 5.0000 mg | ORAL_TABLET | Freq: Once | ORAL | Status: DC | PRN

## 2018-07-24 MED ORDER — LIDOCAINE-EPINEPHRINE (PF) 1 %-1:200000 IJ SOLN
INTRAMUSCULAR | Status: AC
  Filled 2018-07-24: qty 30

## 2018-07-24 MED ORDER — OXYCODONE HCL 5 MG/5ML PO SOLN: 5.0000 mg | Freq: Once | ORAL | Status: DC | PRN

## 2018-07-24 MED ORDER — ONDANSETRON HCL 4 MG/2ML IJ SOLN
INTRAMUSCULAR | Status: AC
  Filled 2018-07-24: qty 2

## 2018-07-24 MED ORDER — LIDOCAINE HCL (PF) 1 % IJ SOLN
INTRAMUSCULAR | Status: AC
  Filled 2018-07-24: qty 30

## 2018-07-24 MED ORDER — ROCURONIUM BROMIDE 10 MG/ML (PF) SYRINGE
PREFILLED_SYRINGE | INTRAVENOUS | Status: DC | PRN
  Administered 2018-07-24 (×2): 20 mg via INTRAVENOUS

## 2018-07-24 MED ORDER — PROTAMINE SULFATE 10 MG/ML IV SOLN
INTRAVENOUS | Status: DC | PRN
  Administered 2018-07-24: 60 mg via INTRAVENOUS

## 2018-07-24 MED ORDER — FENTANYL CITRATE (PF) 250 MCG/5ML IJ SOLN
INTRAMUSCULAR | Status: AC
  Filled 2018-07-24: qty 5

## 2018-07-24 MED ORDER — MIDAZOLAM HCL 5 MG/5ML IJ SOLN
INTRAMUSCULAR | Status: DC | PRN
  Administered 2018-07-24: 1 mg via INTRAVENOUS

## 2018-07-24 MED ORDER — ALTEPLASE 2 MG IJ SOLR: 2.0000 mg | Freq: Once | INTRAMUSCULAR | Status: DC | PRN

## 2018-07-24 MED ORDER — DEXAMETHASONE SODIUM PHOSPHATE 10 MG/ML IJ SOLN
INTRAMUSCULAR | Status: DC | PRN
  Administered 2018-07-24: 4 mg via INTRAVENOUS

## 2018-07-24 SURGICAL SUPPLY — 59 items
BAG DECANTER FOR FLEXI CONT (MISCELLANEOUS) ×3 IMPLANT
BIOPATCH RED 1 DISK 7.0 (GAUZE/BANDAGES/DRESSINGS) ×3 IMPLANT
CANISTER SUCT 3000ML PPV (MISCELLANEOUS) ×3 IMPLANT
CATH PALINDROME RT-P 15FX19CM (CATHETERS) IMPLANT
CATH PALINDROME RT-P 15FX23CM (CATHETERS) ×1 IMPLANT
CATH PALINDROME RT-P 15FX28CM (CATHETERS) IMPLANT
CATH PALINDROME RT-P 15FX55CM (CATHETERS) IMPLANT
CATH STRAIGHT 5FR 65CM (CATHETERS) IMPLANT
CLIP VESOCCLUDE MED 6/CT (CLIP) ×3 IMPLANT
CLIP VESOCCLUDE SM WIDE 6/CT (CLIP) ×3 IMPLANT
COVER PROBE W GEL 5X96 (DRAPES) ×4 IMPLANT
COVER SURGICAL LIGHT HANDLE (MISCELLANEOUS) ×3 IMPLANT
DECANTER SPIKE VIAL GLASS SM (MISCELLANEOUS) ×3 IMPLANT
DERMABOND ADVANCED (GAUZE/BANDAGES/DRESSINGS) ×2
DERMABOND ADVANCED .7 DNX12 (GAUZE/BANDAGES/DRESSINGS) ×2 IMPLANT
DRAPE C-ARM 42X72 X-RAY (DRAPES) ×3 IMPLANT
DRAPE CHEST BREAST 15X10 FENES (DRAPES) ×3 IMPLANT
DRSG COVADERM 4X6 (GAUZE/BANDAGES/DRESSINGS) ×1 IMPLANT
ELECT REM PT RETURN 9FT ADLT (ELECTROSURGICAL) ×3
ELECTRODE REM PT RTRN 9FT ADLT (ELECTROSURGICAL) ×2 IMPLANT
GAUZE SPONGE 4X4 12PLY STRL LF (GAUZE/BANDAGES/DRESSINGS) ×1 IMPLANT
GAUZE SPONGE 4X4 16PLY XRAY LF (GAUZE/BANDAGES/DRESSINGS) ×2 IMPLANT
GLOVE BIO SURGEON STRL SZ7 (GLOVE) ×3 IMPLANT
GLOVE BIOGEL PI IND STRL 7.5 (GLOVE) ×2 IMPLANT
GLOVE BIOGEL PI INDICATOR 7.5 (GLOVE) ×2
GOWN STRL REUS W/ TWL LRG LVL3 (GOWN DISPOSABLE) ×6 IMPLANT
GOWN STRL REUS W/TWL LRG LVL3 (GOWN DISPOSABLE) ×3
GRAFT GORETEX STRT 4-7X45 (Vascular Products) ×1 IMPLANT
HEMOSTAT SPONGE AVITENE ULTRA (HEMOSTASIS) ×1 IMPLANT
KIT BASIN OR (CUSTOM PROCEDURE TRAY) ×3 IMPLANT
KIT TURNOVER KIT B (KITS) ×3 IMPLANT
NDL 18GX1X1/2 (RX/OR ONLY) (NEEDLE) ×2 IMPLANT
NDL HYPO 25GX1X1/2 BEV (NEEDLE) ×2 IMPLANT
NEEDLE 18GX1X1/2 (RX/OR ONLY) (NEEDLE) ×3 IMPLANT
NEEDLE HYPO 25GX1X1/2 BEV (NEEDLE) ×3 IMPLANT
NS IRRIG 1000ML POUR BTL (IV SOLUTION) ×3 IMPLANT
PACK CV ACCESS (CUSTOM PROCEDURE TRAY) ×3 IMPLANT
PACK SURGICAL SETUP 50X90 (CUSTOM PROCEDURE TRAY) ×3 IMPLANT
PAD ARMBOARD 7.5X6 YLW CONV (MISCELLANEOUS) ×7 IMPLANT
SET MICROPUNCTURE 5F STIFF (MISCELLANEOUS) IMPLANT
SOAP 2 % CHG 4 OZ (WOUND CARE) ×3 IMPLANT
SUT ETHILON 3 0 PS 1 (SUTURE) ×3 IMPLANT
SUT MNCRL AB 4-0 PS2 18 (SUTURE) ×4 IMPLANT
SUT PROLENE 6 0 BV (SUTURE) ×7 IMPLANT
SUT PROLENE 7 0 BV 1 (SUTURE) ×1 IMPLANT
SUT SILK 2 0 SH (SUTURE) ×1 IMPLANT
SUT VIC AB 3-0 SH 27 (SUTURE) ×2
SUT VIC AB 3-0 SH 27X BRD (SUTURE) ×4 IMPLANT
SYR 10ML LL (SYRINGE) ×3 IMPLANT
SYR 20CC LL (SYRINGE) ×6 IMPLANT
SYR 3ML LL SCALE MARK (SYRINGE) ×3 IMPLANT
SYR 5ML LL (SYRINGE) ×3 IMPLANT
SYR CONTROL 10ML LL (SYRINGE) ×3 IMPLANT
TOWEL GREEN STERILE (TOWEL DISPOSABLE) ×3 IMPLANT
TOWEL GREEN STERILE FF (TOWEL DISPOSABLE) ×3 IMPLANT
TRAY CATH LUMEN 1 20CM STRL (SET/KITS/TRAYS/PACK) ×1 IMPLANT
UNDERPAD 30X30 (UNDERPADS AND DIAPERS) ×3 IMPLANT
WATER STERILE IRR 1000ML POUR (IV SOLUTION) ×3 IMPLANT
WIRE AMPLATZ SS-J .035X180CM (WIRE) IMPLANT

## 2018-07-24 NOTE — Anesthesia Preprocedure Evaluation (Addendum)
Anesthesia Evaluation  Patient identified by MRN, date of birth, ID band Patient awake    Reviewed: Allergy & Precautions, NPO status , Patient's Chart, lab work & pertinent test results  Airway Mallampati: II  TM Distance: >3 FB Neck ROM: Full    Dental  (+) Dental Advisory Given, Missing,    Pulmonary    breath sounds clear to auscultation       Cardiovascular hypertension, + Valvular Problems/Murmurs  Rhythm:Regular Rate:Normal     Neuro/Psych  Headaches, PSYCHIATRIC DISORDERS Depression  Neuromuscular disease CVA    GI/Hepatic GERD  ,  Endo/Other  diabetes, Type 2Hyperthyroidism   Renal/GU Dialysis and ESRFRenal disease     Musculoskeletal  (+) Fibromyalgia -  Abdominal (+) + obese,   Peds  Hematology   Anesthesia Other Findings - HLD  Reproductive/Obstetrics                            Lab Results  Component Value Date   WBC 7.0 07/24/2018   HGB 9.3 (L) 07/24/2018   HCT 31.8 (L) 07/24/2018   MCV 100.0 07/24/2018   PLT 169 07/24/2018   Lab Results  Component Value Date   CREATININE 6.39 (H) 07/24/2018   BUN 43 (H) 07/24/2018   NA 139 07/24/2018   K 4.7 07/24/2018   CL 105 07/24/2018   CO2 26 07/24/2018   Lab Results  Component Value Date   INR 1.01 07/11/2018   INR 1.11 02/17/2018   INR 1.00 04/19/2014   EKG: normal sinus rhythm.  Anesthesia Physical Anesthesia Plan  ASA: III  Anesthesia Plan: General   Post-op Pain Management:    Induction: Intravenous  PONV Risk Score and Plan: 4 or greater and Ondansetron, Treatment may vary due to age or medical condition and Midazolam  Airway Management Planned: Oral ETT  Additional Equipment: None  Intra-op Plan:   Post-operative Plan: Extubation in OR  Informed Consent: I have reviewed the patients History and Physical, chart, labs and discussed the procedure including the risks, benefits and alternatives for the  proposed anesthesia with the patient or authorized representative who has indicated his/her understanding and acceptance.   Dental advisory given  Plan Discussed with: CRNA  Anesthesia Plan Comments:        Anesthesia Quick Evaluation

## 2018-07-24 NOTE — Progress Notes (Addendum)
PROGRESS NOTE    MEAGEN LIMONES  HGD:[id number] DOB: Dec 17, 1971 DOA: 07/21/2018 PCP: Briscoe Deutscher, DO  Outpatient Specialists:     Brief Narrative:  Patient is a 47 year old African-American female with past medical history significant for hypertension, diabetes mellitus that is complicated by neuropathy, gastroparesis and retinopathy; grade 1 diastolic dysfunction, secondary hyperparathyroidism and obesity.  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  Apparently, with patient presented to the hospital for AVG placement, but was noted to have significant hyperkalemia (potassium of 6.6).  Temporary hemo-dialysis access was established and the patient underwent first hemodialysis yesterday.  The plan is to dialyze patient today, and on Monday prior to new AVG placement.  Nephrology input is appreciated.  The potassium is down to 4.9.    Assessment & Plan:   Principal Problem:   Hyperkalemia Active Problems:   Diabetes mellitus, type II, insulin dependent ([place]), on CGM, followed by Endocrinology, with end organ damage: renal, neuropathy, gastroparesis, retinopathy   Essential hypertension   Gastroparesis due to DM ([place])   Secondary hyperparathyroidism ([place])   CKD (chronic kidney disease) stage 5, GFR less than 15 ml/min ([facility])   Proliferative diabetic retinopathy ([place])   Morbid obesity due to excess calories ([facility])   Grade I diastolic dysfunction   Current Problems:   1) CKD 5 / new esrd dialysis patient:  - has had 3 inpt HD sessions, 8L off altogether, wt's down 4-5kg - went for new AVG LUA today, new TDC and removal temp cath - further plans per renal svc - not sure if CLIP process has been started  yet  2) Hyperkalemia:   - resolved  3) Diabetes mellitus type 2 with endorgan damage including renal, neurological, gastric, and retinopathy - lantus 28 u qd + SSI, bs's well controlled here  4) Grade 1 diastolic  dysfunction with preserved ejection fraction:  - Continue blood pressure management.  Patient with evidence of left ventricular hypertrophy on echocardiogram from February 2019.  5) Essential hypertension:  - cont amlodipine and Coreg, adjustments per renal svc  6) Vol overload:  - 8 liters removed so far w/ HD x 3  Morbid obesity: Diet and exercise.      Kelly Splinter MD [facility] pgr [phone] 05/21/2018, 9:25 AM   DVT prophylaxis: SQ heparin Code Status: Full code Family Communication:  Sister  Disposition Plan: Likely home Consults called: Nephrology Dr. Justin Mend, vascular Dr. [name] Admission status: Inpatient     Procedures:   AVG placement / new TDC/ 3-lumen CVC by Dr [name] 7/29  Hemodialysis prn  Antimicrobials:   None   Subjective: No new complaints.  No shortness of breath.  Objective: Vitals:   07/24/18 1600 07/24/18 1615 07/24/18 1629 07/24/18 1649  BP: (!) 145/78 124/76 128/72 135/83  Pulse: 75 79 81 80  Resp: 18 10 15    Temp:   97.6 F (36.4 C) 98.4 F (36.9 C)  TempSrc:    Oral  SpO2: 99% 98% 96% 95%  Weight:      Height:        Intake/Output Summary (Last 24 hours) at 07/24/2018 1731 Last data filed at 07/24/2018 1722 Gross per 24 hour  Intake 740 ml  Output 3030 ml  Net -2290 ml   Filed Weights   07/23/18 0906 07/24/18 0612 07/24/18 0730  Weight: 103 kg (227 lb 1.6 oz) 104 kg (229 lb 4.8 oz) 104 kg (229 lb 4.8 oz)  Examination:  General exam: Obese.  Appears calm and comfortable  Respiratory system: Clear to auscultation. Respiratory effort normal. Cardiovascular system: S1 & S2 heard. Gastrointestinal system: Abdomen is nondistended, soft and nontender. No organomegaly or masses felt. Normal bowel sounds heard. Central nervous system: Alert and oriented. No focal neurological deficits. Extremities: Bilateral leg edema.  LUA AVG+bruit, R IJ TDC Psychiatry: Judgement and insight appear normal. Mood & affect  appropriate.     Data Reviewed: I have personally reviewed following labs and imaging studies  CBC: Recent Labs  Lab 07/21/18 0711 07/21/18 0733 07/21/18 1051 07/24/18 0747  WBC  --   --  8.1 7.0  HGB 12.9 12.9 10.7* 9.3*  HCT 38.0 38.0 35.1* 31.8*  MCV  --   --  97.2 100.0  PLT  --   --  194 096   Basic Metabolic Panel: Recent Labs  Lab 07/21/18 0711 07/21/18 0733 07/21/18 0942 07/22/18 0309 07/24/18 0303 07/24/18 0746  NA 137 138 137 137 140 139  K 6.3* 6.6* 6.7* 4.9 4.4 4.7  CL 109  --  111 107 106 105  CO2  --   --  16* 25 26 26   GLUCOSE 185* 197* 200* 127* 132* 117*  BUN 49*  --  53* 43* 42* 43*  CREATININE 6.70*  --  6.13*  6.27* 5.60* 6.37* 6.39*  CALCIUM  --   --  8.1* 8.1* 8.0* 8.1*  PHOS  --   --  4.8*  --  5.8* 5.5*   GFR: Estimated Creatinine Clearance: 13.6 mL/min (A) (by C-G formula based on SCr of 6.39 mg/dL (H)). Liver Function Tests: Recent Labs  Lab 07/21/18 0942 07/21/18 1931 07/24/18 0303 07/24/18 0746  ALT  --  19  --   --   ALBUMIN 2.7*  --  2.1* 2.2*   No results for input(s): LIPASE, AMYLASE in the last 168 hours. No results for input(s): AMMONIA in the last 168 hours. Coagulation Profile: No results for input(s): INR, PROTIME in the last 168 hours. Cardiac Enzymes: No results for input(s): CKTOTAL, CKMB, CKMBINDEX, TROPONINI in the last 168 hours. BNP (last 3 results) Recent Labs    01/02/18 0825 04/11/18 1421  PROBNP 32.0 65.0   HbA1C: No results for input(s): HGBA1C in the last 72 hours. CBG: Recent Labs  Lab 07/23/18 1558 07/23/18 2037 07/24/18 1014 07/24/18 1503 07/24/18 1717  GLUCAP 199* 125* 91 89 152*   Lipid Profile: No results for input(s): CHOL, HDL, LDLCALC, TRIG, CHOLHDL, LDLDIRECT in the last 72 hours. Thyroid Function Tests: No results for input(s): TSH, T4TOTAL, FREET4, T3FREE, THYROIDAB in the last 72 hours. Anemia Panel: No results for input(s): VITAMINB12, FOLATE, FERRITIN, TIBC, IRON,  RETICCTPCT in the last 72 hours. Urine analysis:    Component Value Date/Time   COLORURINE STRAW (A) 07/11/2018 2004   APPEARANCEUR HAZY (A) 07/11/2018 2004   LABSPEC 1.008 07/11/2018 2004   PHURINE 7.0 07/11/2018 2004   GLUCOSEU 50 (A) 07/11/2018 2004   HGBUR NEGATIVE 07/11/2018 2004   BILIRUBINUR NEGATIVE 07/11/2018 2004   BILIRUBINUR Negative 07/29/2017 0802   BILIRUBINUR neg 08/08/2013   KETONESUR NEGATIVE 07/11/2018 2004   PROTEINUR >=300 (A) 07/11/2018 2004   UROBILINOGEN 0.2 07/29/2017 0802   UROBILINOGEN 0.2 09/07/2015 1230   NITRITE NEGATIVE 07/11/2018 2004   LEUKOCYTESUR NEGATIVE 07/11/2018 2004   Sepsis Labs: @LABRCNTIP (procalcitonin:4,lacticidven:4)  ) Recent Results (from the past 240 hour(s))  Surgical pcr screen     Status: None   Collection Time: 07/23/18  8:31 PM  Result Value Ref Range Status   MRSA, PCR NEGATIVE NEGATIVE Final   Staphylococcus aureus NEGATIVE NEGATIVE Final    Comment: (NOTE) The Xpert SA Assay (FDA approved for NASAL specimens in patients 24 years of age and older), is one component of a comprehensive surveillance program. It is not intended to diagnose infection nor to guide or monitor treatment. Performed at [facility], [place] [address] [place], [place] [address]          Radiology Studies: Dg Chest Port 1 View  Result Date: 07/24/2018 CLINICAL DATA:  Check dialysis catheter placement EXAM: PORTABLE CHEST 1 VIEW COMPARISON:  None. FINDINGS: Cardiac shadow is mildly enlarged. Right jugular dialysis catheter is noted in satisfactory position. No pneumothorax is seen. The lungs are clear. Loop recorder is noted and stable. No bony abnormality is seen. IMPRESSION: No pneumothorax following jugular dialysis catheter placement Electronically Signed   By: Inez Catalina M.D.   On: 07/24/2018 15:51   Dg Fluoro Guide Cv Line-no Report  Result Date: 07/24/2018 Fluoroscopy was utilized by the requesting physician.  No radiographic  interpretation.        Scheduled Meds: . amLODipine  10 mg Oral QHS  . calcitRIOL  0.75 mcg Oral Daily  . carvedilol  12.5 mg Oral Daily  . chlorhexidine  60 mL Topical Once   And  . chlorhexidine  60 mL Topical Once  . Chlorhexidine Gluconate Cloth  6 each Topical Q0600  . clopidogrel  75 mg Oral Daily  . fentaNYL      . heparin  5,000 Units Subcutaneous Q8H  . insulin aspart  0-9 Units Subcutaneous TID WC  . insulin glargine  28 Units Subcutaneous Q lunch  . simvastatin  5 mg Oral QHS   Continuous Infusions: . sodium chloride Stopped (07/24/18 1454)  . sodium chloride    . sodium chloride    . sodium chloride    . sodium chloride    . sodium chloride       LOS: 3 days    Time spent: 25 minutes.

## 2018-07-24 NOTE — Anesthesia Postprocedure Evaluation (Signed)
Anesthesia Post Note  Patient: Dot Splinter [place]  Procedure(s) Performed: INSERTION OF TUNNELED DIALYSIS CATHETER (Left ) REDO ARTERIOVENOUS GORETEX GRAFT (Left ) INSERTION CENTRAL LINE ADULT (Left Groin)     Patient location during evaluation: PACU Anesthesia Type: General Level of consciousness: awake and alert Pain management: pain level controlled Vital Signs Assessment: post-procedure vital signs reviewed and stable Respiratory status: spontaneous breathing, nonlabored ventilation, respiratory function stable and patient connected to nasal cannula oxygen Cardiovascular status: blood pressure returned to baseline and stable Postop Assessment: no apparent nausea or vomiting Anesthetic complications: no    Last Vitals:  Vitals:   07/24/18 1629 07/24/18 1649  BP: 128/72 135/83  Pulse: 81 80  Resp: 15   Temp: 36.4 C 36.9 C  SpO2: 96% 95%    Last Pain:  Vitals:   07/24/18 1649  TempSrc: Oral  PainSc: 6                  Effie Berkshire

## 2018-07-24 NOTE — Progress Notes (Signed)
Went to give medication- patient reported numbness/tingling on thumb (Left arm).  Pulse ox for thumb was 95 %, 2+ radial pulse; but hand was cold to touch.  Paged MD and Vascular surgeon.    Dr. Jonnie Finner returned call-  Wanted [name] to call on-call surgeon.

## 2018-07-24 NOTE — Op Note (Addendum)
OPERATIVE NOTE  PROCEDURE: 1.  Placement of left upper arm arteriovenous graft (redo) 2.  Conversion of right internal jugular vein temporary dialysis catheter for tunneled dialysis catheter 3.  Left femoral central venous catheter placement 4.  Left femoral vein cannulation under ultrasound guidance  PRE-OPERATIVE DIAGNOSIS: end-stage renal failure  POST-OPERATIVE DIAGNOSIS: same as above  SURGEON: Adele Barthel, MD  ANESTHESIA: general  ESTIMATED BLOOD LOSS: 30 cc  FINDING(S): 1.  Tips of the catheter in the right atrium on fluoroscopy 2.  No obvious pneumothorax on fluoroscopy 3.  Palpable thrill in graft 4.  Dopplerable left radial artery signal  SPECIMEN(S):  none  INDICATIONS:   Heather [name] is a 47 y.o. female who presents with hemodialysis dependence.  The patient presents for tunneled dialysis catheter placement.  The patient is aware the risks of tunneled dialysis catheter placement include but are not limited to: bleeding, infection, central venous injury, pneumothorax, possible venous stenosis, possible malpositioning in the venous system, and possible infections related to long-term catheter presence.  The patient was aware of these risks and agreed to proceed.   DESCRIPTION: After written full informed consent was obtained from the patient, the patient was taken back to the operating room.  Anesthesia was unable to get IV access, so I felt femoral vein access was necessary.  The left groin was prepped with Chlorhexidine.  The left groin was sterilely draped.  I draped a Sonosite probe sterilely.  Under Sonosite guidance, I cannulated the left femoral vein.  I J-wire was placed through the needle into the femoral vein.  The needle was exchange for a dilator.  The dilator was exchanged for the femoral central venous catheter.    The wire was removed and then connected to IV tubing.  I secured the femoral line with two 3-0 silk stitches tied around the catheter.   A Tegraderm was affixed to secure the catehter.  Prior to induction, the patient was given IV antibiotics.  After obtaining adequate sedation, the patient was prepped and draped in the standard fashion for a chest or neck tunneled dialysis catheter placement.     I open one port of the temporary dialysis catheter.  A J-wire was then placed down into the inferior vena cava through the open port under fluoroscopic guidance.  I exchanged the catheter for the medium venous dilator.  The skin tract and venotomy was dilated with the medium dilator.  Finally, the dilator-sheath was placed under fluoroscopic guidance into the superior vena cava.  The central dilator and wire were removed.  A 23 cm Palindrome catheter was placed under fluoroscopic guidance down into the right atrium.    The sheath was broken and peeled away while holding the catheter cuff at the level of the skin.  The catheter was clamped with the plastic clamp and the back end of this catheter was transected.  One lumen was docked onto the tunneler.  The catheter was delivered through the subcutaneous tunnel by pulling the tunneler out the exit site.  The catheter was reclamped and was transected a second time, revealing the two lumens of this catheter.  The catheter collar was loaded over the back end of the catheter.  The two ports were docked onto these two lumens.  The catheter collar was then snapped into place, securing the two ports.  Each port was tested by aspirating and flushing.  No resistance was noted.  Each port was then thoroughly flushed with heparinized saline.  The  catheter was secured in placed with two interrupted stitches of 3-0 Nylon tied to the catheter.  The neck incision was closed with a U-stitch of 4-0 Monocryl.  The neck and chest incision were cleaned.  The closed neck incision was reinforced with Dermabond and sterile bandages applied to the catheter exit site.  Each port was then loaded with concentrated heparin (1000  Units/mL) at the manufacturer recommended volumes to each port.  Sterile caps were applied to each port.    On completion fluoroscopy, the tips of the catheter were in the right atrium, and there was no evidence of pneumothorax.  At this point, the drapes were taken down.  The patient was repositioned and reprepped in the standard fashion for a left arm access procedure.  I turned my attention first to the antecubitum.  Under ultrasound guidance, I identified the location of the high brachial artery and high brachial vein at the level of axillla and marked it on the skin.    I made an longitudinal incision over the brachial artery, and dissected down through the subcutaneous tissue and  fascia carefully and was able to dissect out the high brachial artery.  The artery was about 3.5 mm externally.  It was controlled proximally and distally with vessel loops.  I then dissected out the high brachial vein.  Externally, it appeared to be 5-6 mm in diameter.  I then dissected this vein proximally and distal.    I then marked a loop out on the upper arm for the graft.  I made an incision at the apex of the route and dissected a shallow pocket.  I took a Dietitian and dissected from the axillary incision to the apical incision.  Then I delivered the 4 x 7-mm stretch Gore-Tex graft, through this metal tunneler and then pulled out the metal tunneler leaving the graft in place.  I then dissected from the axillary to the apical incision with the metal Gore tunneler and deliver the graft through the metal tunnel.  I removed the tunnel, leaving the graft in place with the 4-mm end adjacent to the brachial artery and the 7-mm end adjacent to the brachial vein.    I then gave the patient 10000 units of heparin to gain anticoagulation.  After waiting 3 minutes, I placed the brachial artery under tension proximally and distally with vessel loops, made an arteriotomy and extended it with a Potts scissor.  I sewed  the 4-mm end of the graft to this arteriotomy with a running stitch of 7-0 Prolene in an end-to-side configuration.  At this point, then I completed the anastomosis in the usual fashion.  I released the vessel loops on the inflow and allowed the artery to decompress through the graft. There was good pulsatile bleeding through this graft.  I clamped the graft near its arterial anastomosis and sucked out all the blood in the graft and loaded the graft with heparinized saline.    At this point, I pulled the graft to appropriate length and reset my exposure of the high brachial vein.  I tied off the high brachial vein distally with a 2-0 silk and then transected it.  There was limited venous backbleeding from the vein.  Then, I injected some heparinized saline into this vein and then clamped it.  I then spatulated the vein to facilitate an end-to-end anastomosis.  I also spatulated the graft to facilitate an end-to-end anastomosis.  In the process of spatulating, I cut the graft  to appropriate length for this anastomosis.  This graft was sewn to the vein in an end-to-end configuration with a 6-0 Prolene.  Prior to completing this anastomosis, I allowed the vein to back bleed and then I also allowed the artery to bleed in an antegrade fashion.  There was no clot released from either end of this anastomosis.  I completed this anastomosis in the usual fashion, and then irrigated out the high bicipital exposure and then placed Avitene.  I then turned my attention back to the wrist.  The distal radial artery was dopplerable.   I turned my attention back to the axilla.  Using a continuous Doppler, the brachial artery proximal and distal to the anastomosis had monophasic waveforms.  The venous outflow had a flow signature consistent with widely patent arteriovenous graft.  I could also feel a thrill at the apex of this graft.  At this point, I washed out both incisions and placed Avitene.  After waiting a few minutes,  there was no more active bleeding.  I washed out both incision and removed the Avitene.    The subcutaneous tissue in the apical incision was reapproximated with a running stitch of 3-0 Vicryl.  The skin was then reapproximated with a running subcuticular 4-0 Monocryl.  The skin was then cleaned, dried, and Dermabond used to reinforce the skin closure.    I then turned my attention to the axillary incision.  There was no more active bleeding.  The subcutaneous tissue was repaired with double layer of a running stitch of 3-0 Vicryl.  The skin was then reapproximated with running subcuticular stitch of 4-0 Monocryl.  The skin was then cleaned, dried, and then the skin closure was reinforced with Dermabond.     COMPLICATIONS: none  CONDITION: stable   Adele Barthel, MD, [facility] [place] Office: [phone] Pager: [phone]  07/24/2018, 1:21 PM

## 2018-07-24 NOTE — Care Management Note (Signed)
Case Management Note  Patient Details  Name: Heather [name] MRN: [id number] Date of Birth: [birth year]  Subjective/Objective:   Hyperkalemia               Action/Plan: Patient lives at home; independent of her ADL's; PCP: Briscoe Deutscher, DO; has private insurance with [employer] with prescription drug coverage; CM will continue to follow for progression of care.  Expected Discharge Date:     Possibly 07/28/1018             Expected Discharge Plan:  Home/Self Care  Discharge planning Services  CM Consult  Status of Service:  In process, will continue to follow  Sherrilyn Rist [phone] 07/24/2018, 10:46 AM

## 2018-07-24 NOTE — Progress Notes (Signed)
Patient's jewelry (rings and earrings) and cell phone and telemetry box picked up from short stay and placed in 3E12 in top drawer of nightstand.

## 2018-07-24 NOTE — Anesthesia Procedure Notes (Signed)
Procedure Name: Intubation Date/Time: 07/24/2018 12:06 PM Performed by: Marsa Aris, CRNA Pre-anesthesia Checklist: Patient identified, Emergency Drugs available, Suction available, Patient being monitored and Timeout performed Patient Re-evaluated:Patient Re-evaluated prior to induction Oxygen Delivery Method: Circle system utilized Preoxygenation: Pre-oxygenation with 100% oxygen Induction Type: IV induction Ventilation: Mask ventilation without difficulty Laryngoscope Size: Miller and 2 Grade View: Grade I Tube size: 7.0 mm Number of attempts: 1 Airway Equipment and Method: Stylet Placement Confirmation: ETT inserted through vocal cords under direct vision,  positive ETCO2,  CO2 detector and breath sounds checked- equal and bilateral Secured at: 22 cm Tube secured with: Tape Dental Injury: Teeth and Oropharynx as per pre-operative assessment  Comments: cspine neutrality maintained, no change in dentition from pre-procedure

## 2018-07-24 NOTE — Interval H&P Note (Signed)
History and Physical Interval Note:  07/24/2018 11:51 AM  Heather [name]  has presented today for surgery, with the diagnosis of ESRD  The various methods of treatment have been discussed with the patient and family. After consideration of risks, benefits and other options for treatment, the patient has consented to  Procedure(s): INSERTION OF TUNNELED DIALYSIS CATHETER (Left) REDO ARTERIOVENOUS GORETEX GRAFT (Left) as a surgical intervention .  The patient's history has been reviewed, patient examined, no change in status, stable for surgery.  I have reviewed the patient's chart and labs.  Questions were answered to the patient's satisfaction.     Adele Barthel

## 2018-07-24 NOTE — Discharge Instructions (Signed)
° °  [facility]  Discharge Instructions  AV Fistula or Graft Surgery for Dialysis Access  Please refer to the following instructions for your post-procedure care. Your surgeon or physician assistant will discuss any changes with you.  Activity  You may drive the day following your surgery, if you are comfortable and no longer taking prescription pain medication. Resume full activity as the soreness in your incision resolves.  Bathing/Showering  You may shower after you go home. Keep your incision dry for 48 hours. Do not soak in a bathtub, hot tub, or swim until the incision heals completely. You may not shower if you have a hemodialysis catheter.  Incision Care  Clean your incision with mild soap and water after 48 hours. Pat the area dry with a clean towel. You do not need a bandage unless otherwise instructed. Do not apply any ointments or creams to your incision. You may have skin glue on your incision. Do not peel it off. It will come off on its own in about one week. Your arm may swell a bit after surgery. To reduce swelling use pillows to elevate your arm so it is above your heart. Your doctor will tell you if you need to lightly wrap your arm with an ACE bandage.  Diet  Resume your normal diet. There are not special food restrictions following this procedure. In order to heal from your surgery, it is CRITICAL to get adequate nutrition. Your body requires vitamins, minerals, and protein. Vegetables are the best source of vitamins and minerals. Vegetables also provide the perfect balance of protein. Processed food has little nutritional value, so try to avoid this.  Medications  Resume taking all of your medications. If your incision is causing pain, you may take over-the counter pain relievers such as acetaminophen (Tylenol). If you were prescribed a stronger pain medication, please be aware these medications can cause nausea and constipation. Prevent  nausea by taking the medication with a snack or meal. Avoid constipation by drinking plenty of fluids and eating foods with high amount of fiber, such as fruits, vegetables, and grains.  Do not take Tylenol if you are taking prescription pain medications.  Follow up Your surgeon may want to see you in the office following your access surgery. If so, this will be arranged at the time of your surgery.  Please call us immediately for any of the following conditions:  Increased pain, redness, drainage (pus) from your incision site Fever of 101 degrees or higher Severe or worsening pain at your incision site Hand pain or numbness.  Reduce your risk of vascular disease:  Stop smoking. If you would like help, call [facility] at 1-800-QUIT-NOW 510-158-2573) or [place] [facility] [place] your cholesterol Maintain a desired weight Control your diabetes Keep your blood pressure down  Dialysis  It will take several weeks to several months for your new dialysis access to be ready for use. Your surgeon will determine when it is okay to use it. Your nephrologist will continue to direct your dialysis. You can continue to use your Permcath until your new access is ready for use.   07/24/2018 Heather [name] [id number] 1971/05/11  Surgeon(s): Conrad Girard, MD  Procedure(s): INSERTION OF TUNNELED DIALYSIS CATHETER REDO ARTERIOVENOUS GORETEX GRAFT INSERTION CENTRAL LINE ADULT  x Do not stick graft for 4 weeks    If you have any questions, please call the office at [phone].

## 2018-07-24 NOTE — Progress Notes (Signed)
Called by nursing staff with concern regarding numbness in her left hand.  Underwent left upper arm loop AV Gore-Tex graft by Dr. [name] today.  She reports that she has had some numbness which is come and gone but none currently.  Her hand is warm and equal compared to her right hand.  She does have a 1-2+ palpable radial pulse.  Does have a bruit in her upper arm graft with Doppler.  Some tenderness over the graft tunnel.  Equal grip strength right and left and she subjectively feels that she does not have any weakness in her left hand  Some numbness in her left hand mainly in her left thumb most likely related to peripheral nerve.  Do not feel that she has any evidence of steal and certainly no severe steal currently.  Continue observation

## 2018-07-24 NOTE — Transfer of Care (Signed)
Immediate Anesthesia Transfer of Care Note  Patient: ANALAURA MESSLER  Procedure(s) Performed: INSERTION OF TUNNELED DIALYSIS CATHETER (Left ) REDO ARTERIOVENOUS GORETEX GRAFT (Left ) INSERTION CENTRAL LINE ADULT (Left Groin)  Patient Location: PACU  Anesthesia Type:General  Level of Consciousness: awake, alert  and oriented  Airway & Oxygen Therapy: Patient Spontanous Breathing and Patient connected to face mask oxygen  Post-op Assessment: Report given to RN and Post -op Vital signs reviewed and stable  Post vital signs: Reviewed and stable  Last Vitals:  Vitals Value Taken Time  BP 149/78 07/24/2018  2:58 PM  Temp    Pulse 80 07/24/2018  3:01 PM  Resp 13 07/24/2018  3:01 PM  SpO2 100 % 07/24/2018  3:01 PM  Vitals shown include unvalidated device data.  Last Pain:  Vitals:   07/24/18 1030  TempSrc: Oral  PainSc: 0-No pain         Complications: No apparent anesthesia complications

## 2018-07-25 ENCOUNTER — Telehealth: Payer: Self-pay | Admitting: Vascular Surgery

## 2018-07-25 ENCOUNTER — Encounter (HOSPITAL_COMMUNITY): Payer: Self-pay | Admitting: Vascular Surgery

## 2018-07-25 DIAGNOSIS — Z992 Dependence on renal dialysis: Secondary | ICD-10-CM

## 2018-07-25 DIAGNOSIS — N186 End stage renal disease: Secondary | ICD-10-CM

## 2018-07-25 LAB — GLUCOSE, CAPILLARY
GLUCOSE-CAPILLARY: 187 mg/dL — AB (ref 70–99)
GLUCOSE-CAPILLARY: 149 mg/dL — AB (ref 70–99)
Glucose-Capillary: 153 mg/dL — ABNORMAL HIGH (ref 70–99)
Glucose-Capillary: 167 mg/dL — ABNORMAL HIGH (ref 70–99)

## 2018-07-25 MED ORDER — LIP MEDEX EX OINT
TOPICAL_OINTMENT | CUTANEOUS | Status: DC | PRN
Start: 2018-07-25 — End: 2018-07-26
  Filled 2018-07-25: qty 7

## 2018-07-25 MED ORDER — CHLORHEXIDINE GLUCONATE CLOTH 2 % EX PADS: 6.0000 | MEDICATED_PAD | Freq: Every day | CUTANEOUS | Status: DC

## 2018-07-25 MED ORDER — CHLORHEXIDINE GLUCONATE CLOTH 2 % EX PADS
6.0000 | MEDICATED_PAD | Freq: Every day | CUTANEOUS | Status: DC
  Administered 2018-07-25 – 2018-07-26 (×2): 6 via TOPICAL

## 2018-07-25 NOTE — Progress Notes (Signed)
RN rounded on pt. Pt states she does not need anything at this time. 

## 2018-07-25 NOTE — Progress Notes (Signed)
   Daily Progress Note   Assessment/Planning:   POD #1 s/p RIJV TDC conversion, LUA looped AVG   No evidence of steal this AM  Pt has intact motor and radial pulse  Suspect some neuropraxia from dissection and retraction resulted in numbness last night  F/U in office in 4 weeks   Subjective  - 1 Day Post-Op   No further numbness, L arm sore   Objective   Vitals:   07/24/18 2017 07/24/18 2215 07/25/18 0500 07/25/18 0600  BP: (!) 148/69 117/73  131/71  Pulse: 86 81  85  Resp: 18   12  Temp: 97.9 F (36.6 C)   98.4 F (36.9 C)  TempSrc: Oral   Oral  SpO2: 99%   100%  Weight:   225 lb 6.4 oz (102.2 kg)   Height:         Intake/Output Summary (Last 24 hours) at 07/25/2018 0728 Last data filed at 07/25/2018 0547 Gross per 24 hour  Intake 1310.94 ml  Output 3530 ml  Net -2219.06 ml    L axillary inc c/d/i, palpable radial pulse, +bruit and thrill, hand grip 5/5  Laboratory   CBC CBC Latest Ref Rng & Units 07/24/2018 07/21/2018 07/21/2018  WBC 4.0 - 10.5 K/uL 7.0 8.1 -  Hemoglobin 12.0 - 15.0 g/dL 9.3(L) 10.7(L) 12.9  Hematocrit 36.0 - 46.0 % 31.8(L) 35.1(L) 38.0  Platelets 150 - 400 K/uL 169 194 -    BMET    Component Value Date/Time   NA 139 07/24/2018 0746   NA 137 05/09/2018   K 4.7 07/24/2018 0746   K 4.3 10/16/2013   CL 105 07/24/2018 0746   CL 100 10/16/2013   CO2 26 07/24/2018 0746   GLUCOSE 117 (H) 07/24/2018 0746   BUN 43 (H) 07/24/2018 0746   BUN 36 (A) 05/09/2018   CREATININE 6.39 (H) 07/24/2018 0746   CREATININE 3.05 (H) 09/03/2015 0855   CALCIUM 8.1 (L) 07/24/2018 0746   CALCIUM 8.9 10/16/2013   GFRNONAA 7 (L) 07/24/2018 0746   GFRNONAA 18 (L) 09/03/2015 0855   GFRAA 8 (L) 07/24/2018 0746   GFRAA 20 (L) 09/03/2015 0855     Adele Barthel, MD, FACS [facility] [place] Office: [phone] Pager: [phone]  07/25/2018, 7:28 AM

## 2018-07-25 NOTE — Progress Notes (Signed)
[facility] ROUNDING NOTE   Subjective:   LUE AVG placed yesterday, feels sore but OK.  No issues with HD yesterday.    Objective:  Vital signs in last 24 hours:  Temp:  [97.6 F (36.4 C)-98.4 F (36.9 C)] 98.4 F (36.9 C) (07/30 0600) Pulse Rate:  [75-88] 85 (07/30 0811) Resp:  [10-18] 12 (07/30 0600) BP: (117-199)/(58-105) 129/75 (07/30 0811) SpO2:  [95 %-100 %] 100 % (07/30 0600) Weight:  [102.2 kg (225 lb 6.4 oz)] 102.2 kg (225 lb 6.4 oz) (07/30 0500)  Weight change: 0.998 kg (2 lb 3.2 oz) Filed Weights   07/24/18 0612 07/24/18 0730 07/25/18 0500  Weight: 104 kg (229 lb 4.8 oz) 104 kg (229 lb 4.8 oz) 102.2 kg (225 lb 6.4 oz)    Intake/Output: I/O last 3 completed shifts: In: 1310.9 [P.O.:720; I.V.:590.9] Out: 8185 [Urine:500; Other:3000; Blood:30]   Intake/Output this shift:  Total I/O In: 120 [P.O.:120] Out: -   CVS- RRR RS- CTA temp L IJ   ABD- BS present soft non-distended EXT- no edema, LUE AVG +bruit   Basic Metabolic Panel: Recent Labs  Lab 07/21/18 0711 07/21/18 0733  07/21/18 0942 07/22/18 0309 07/24/18 0303 07/24/18 0746  NA 137 138  --  137 137 140 139  K 6.3* 6.6*  --  6.7* 4.9 4.4 4.7  CL 109  --   --  111 107 106 105  CO2  --   --   --  16* 25 26 26   GLUCOSE 185* 197*  --  200* 127* 132* 117*  BUN 49*  --   --  53* 43* 42* 43*  CREATININE 6.70*  --   --  6.13*  6.27* 5.60* 6.37* 6.39*  CALCIUM  --   --    < > 8.1* 8.1* 8.0* 8.1*  PHOS  --   --   --  4.8*  --  5.8* 5.5*   < > = values in this interval not displayed.    Liver Function Tests: Recent Labs  Lab 07/21/18 0942 07/21/18 1931 07/24/18 0303 07/24/18 0746  ALT  --  19  --   --   ALBUMIN 2.7*  --  2.1* 2.2*   No results for input(s): LIPASE, AMYLASE in the last 168 hours. No results for input(s): AMMONIA in the last 168 hours.  CBC: Recent Labs  Lab 07/21/18 0711 07/21/18 0733 07/21/18 1051 07/24/18 0747  WBC  --   --  8.1 7.0  HGB 12.9 12.9 10.7*  9.3*  HCT 38.0 38.0 35.1* 31.8*  MCV  --   --  97.2 100.0  PLT  --   --  194 169    Cardiac Enzymes: No results for input(s): CKTOTAL, CKMB, CKMBINDEX, TROPONINI in the last 168 hours.  BNP: Invalid input(s): POCBNP  CBG: Recent Labs  Lab 07/24/18 1014 07/24/18 1503 07/24/18 1717 07/24/18 2109 07/25/18 0755  GLUCAP 91 83 152* 209* 153*    Microbiology: Results for orders placed or performed during the hospital encounter of 07/21/18  Surgical pcr screen     Status: None   Collection Time: 07/23/18  8:31 PM  Result Value Ref Range Status   MRSA, PCR NEGATIVE NEGATIVE Final   Staphylococcus aureus NEGATIVE NEGATIVE Final    Comment: (NOTE) The Xpert SA Assay (FDA approved for NASAL specimens in patients 35 years of age and older), is one component of a comprehensive surveillance program. It is not intended to diagnose infection nor to guide or  monitor treatment. Performed at [facility], [place] [address], [place], [place] [address]     Coagulation Studies: No results for input(s): LABPROT, INR in the last 72 hours.  Urinalysis: No results for input(s): COLORURINE, LABSPEC, PHURINE, GLUCOSEU, HGBUR, BILIRUBINUR, KETONESUR, PROTEINUR, UROBILINOGEN, NITRITE, LEUKOCYTESUR in the last 72 hours.  Invalid input(s): APPERANCEUR    Imaging: Dg Chest Port 1 View  Result Date: 07/24/2018 CLINICAL DATA:  Check dialysis catheter placement EXAM: PORTABLE CHEST 1 VIEW COMPARISON:  None. FINDINGS: Cardiac shadow is mildly enlarged. Right jugular dialysis catheter is noted in satisfactory position. No pneumothorax is seen. The lungs are clear. Loop recorder is noted and stable. No bony abnormality is seen. IMPRESSION: No pneumothorax following jugular dialysis catheter placement Electronically Signed   By: Inez Catalina M.D.   On: 07/24/2018 15:51   Dg Fluoro Guide Cv Line-no Report  Result Date: 07/24/2018 Fluoroscopy was utilized by the requesting physician.  No radiographic  interpretation.     Medications:   . sodium chloride 10 mL/hr at 07/24/18 1853   . amLODipine  10 mg Oral QHS  . calcitRIOL  0.75 mcg Oral Daily  . carvedilol  12.5 mg Oral Daily  . chlorhexidine  60 mL Topical Once   And  . chlorhexidine  60 mL Topical Once  . Chlorhexidine Gluconate Cloth  6 each Topical Q0600  . clopidogrel  75 mg Oral Daily  . heparin  5,000 Units Subcutaneous Q8H  . insulin aspart  0-9 Units Subcutaneous TID WC  . insulin glargine  28 Units Subcutaneous Q lunch  . simvastatin  5 mg Oral QHS   acetaminophen **OR** acetaminophen, diphenhydrAMINE, gabapentin, HYDROmorphone (DILAUDID) injection, ketorolac, lip balm, ondansetron **OR** ondansetron (ZOFRAN) IV, ondansetron (ZOFRAN) IV, oxyCODONE-acetaminophen, sodium chloride flush, zolpidem  Assessment/ Plan:   ESRD  New start  Dialysis #1 7/26  Dialysis #2 7/27,Dialysis #3 7/29. Catheter was tunneled yesterday (07/25/2018). Confirmed CLIP underway.   Anemia Hb 9.3 yesterday, had been 13 earlier in the month.  If remains down will start ESA  Hypertension continue to challenge dry weight  Bones  Controlled with no binders  Diabetes  Per primary   Hyperlipidemia     LOS: 4 Jannifer Hick A @TODAY @9 :06 AM

## 2018-07-25 NOTE — Plan of Care (Signed)
?  Problem: Education: ?Goal: Knowledge of General Education information will improve ?Description: Including pain rating scale, medication(s)/side effects and non-pharmacologic comfort measures ?Outcome: Progressing ?  ?Problem: Health Behavior/Discharge Planning: ?Goal: Ability to manage health-related needs will improve ?Outcome: Progressing ?  ?Problem: Clinical Measurements: ?Goal: Ability to maintain clinical measurements within normal limits will improve ?Outcome: Progressing ?  ?Problem: Activity: ?Goal: Risk for activity intolerance will decrease ?Outcome: Progressing ?  ?Problem: Nutrition: ?Goal: Adequate nutrition will be maintained ?Outcome: Progressing ?  ?Problem: Pain Managment: ?Goal: General experience of comfort will improve ?Outcome: Progressing ?  ?

## 2018-07-25 NOTE — Progress Notes (Signed)
PROGRESS NOTE    Heather [name]  XHB:[id number] DOB: Oct 29, 1971 DOA: 07/21/2018 PCP: Briscoe Deutscher, DO  Outpatient Specialists:     Brief Narrative:  Patient is a 47 year old African-American female with past medical history significant for hypertension, diabetes mellitus that is complicated by neuropathy, gastroparesis and retinopathy; grade 1 diastolic dysfunction, secondary hyperparathyroidism and obesity.  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  Apparently, with patient presented to the hospital for AVG placement, but was noted to have significant hyperkalemia (potassium of 6.6).  Temporary hemo-dialysis access was established and the patient underwent first hemodialysis yesterday.  The plan is to dialyze patient today, and on Monday prior to new AVG placement.  Nephrology input is appreciated.  The potassium is down to 4.9.    Assessment & Plan:   Principal Problem:   Hyperkalemia Active Problems:   Diabetes mellitus, type II, insulin dependent ([place]), on CGM, followed by Endocrinology, with end organ damage: renal, neuropathy, gastroparesis, retinopathy   Essential hypertension   Gastroparesis due to DM ([place])   Secondary hyperparathyroidism ([place])   CKD (chronic kidney disease) stage 5, GFR less than 15 ml/min ([facility])   Proliferative diabetic retinopathy ([place])   Morbid obesity due to excess calories ([facility])   Grade I diastolic dysfunction   ESRD (end stage renal disease) ([place])   Current Problems:   1) CKD 5 / new esrd dialysis patient:  - has had 3 inpt HD, wt's down 4-5kg - sp new AVG LUA and new TDC on 7/29 monday - further plans per renal svc - awaiting CLIP (placement in OP HD unit) - close to dc most likely  2) Hyperkalemia:   - resolved  3) Diabetes mellitus type 2 with endorgan damage including renal, neurological, gastric, and retinopathy - lantus 28 u qd + SSI, bs's well controlled here  4) Grade  1 diastolic dysfunction with preserved ejection fraction:  - Continue blood pressure management.  Patient with evidence of left ventricular hypertrophy on echocardiogram from February 2019.  5) Essential hypertension:  - cont amlodipine and Coreg, adjustments per renal svc  6) Vol overload:  - 8 liters removed so far w/ HD x 3 - no vol overload on exam today  7) Morbid obesity: Diet and exercise.     8) Dispo: dc when ready per renal service   Kelly Splinter MD [facility] pgr [phone] 05/21/2018, 9:25 AM   DVT prophylaxis: SQ heparin Code Status: Full code Family Communication: none here today Disposition Plan: Home Consults called: Nephrology Dr. Justin Mend, vascular Dr. [name] Admission status: Inpatient Procedures:  AVG placement / new TDC/ 3-lumen CVC by Dr [name] 7/29 Hemodialysis prn Antimicrobials:  None   Subjective:  Had some L hand numbness overnight seen by VVS last night , good doppler signals, no sign of steal, equal grip bilat, doubt steal syndrome per VVS. Today hand is feeling much better.  No new c/o.  Adjusting to pain from procedures yesterday.  No SOB or cough.   Objective: Vitals:   07/25/18 0500 07/25/18 0600 07/25/18 0811 07/25/18 1125  BP:  131/71 129/75 124/69  Pulse:  85 85 82  Resp:  12  16  Temp:  98.4 F (36.9 C)  98.8 F (37.1 C)  TempSrc:  Oral  Oral  SpO2:  100%  98%  Weight: 102.2 kg (225 lb 6.4 oz)     Height:        Intake/Output Summary (Last 24  hours) at 07/25/2018 1355 Last data filed at 07/25/2018 1324 Gross per 24 hour  Intake 1810.94 ml  Output 530 ml  Net 1280.94 ml   Filed Weights   07/24/18 0612 07/24/18 0730 07/25/18 0500  Weight: 104 kg (229 lb 4.8 oz) 104 kg (229 lb 4.8 oz) 102.2 kg (225 lb 6.4 oz)    Examination:  General exam: Obese.  Appears calm and comfortable  Respiratory system: Clear to auscultation. Respiratory effort normal. Cardiovascular system: S1 & S2 heard. Gastrointestinal system:  Abdomen is nondistended, soft and nontender. No organomegaly or masses felt. Normal bowel sounds heard. Central nervous system: Alert and oriented. No focal neurological deficits. Extremities: Bilateral leg edema.  LUA AVG+bruit, R IJ TDC Psychiatry: Judgement and insight appear normal. Mood & affect appropriate.  Neuro: good bilat grip strength, L hand not sig cool or dusky, looks very similar to perfusion of the R hand grossly    Data Reviewed: I have personally reviewed following labs and imaging studies  CBC: Recent Labs  Lab 07/21/18 0711 07/21/18 0733 07/21/18 1051 07/24/18 0747  WBC  --   --  8.1 7.0  HGB 12.9 12.9 10.7* 9.3*  HCT 38.0 38.0 35.1* 31.8*  MCV  --   --  97.2 100.0  PLT  --   --  194 573   Basic Metabolic Panel: Recent Labs  Lab 07/21/18 0711 07/21/18 0733 07/21/18 0942 07/22/18 0309 07/24/18 0303 07/24/18 0746  NA 137 138 137 137 140 139  K 6.3* 6.6* 6.7* 4.9 4.4 4.7  CL 109  --  111 107 106 105  CO2  --   --  16* 25 26 26   GLUCOSE 185* 197* 200* 127* 132* 117*  BUN 49*  --  53* 43* 42* 43*  CREATININE 6.70*  --  6.13*  6.27* 5.60* 6.37* 6.39*  CALCIUM  --   --  8.1* 8.1* 8.0* 8.1*  PHOS  --   --  4.8*  --  5.8* 5.5*   GFR: Estimated Creatinine Clearance: 13.5 mL/min (A) (by C-G formula based on SCr of 6.39 mg/dL (H)). Liver Function Tests: Recent Labs  Lab 07/21/18 0942 07/21/18 1931 07/24/18 0303 07/24/18 0746  ALT  --  19  --   --   ALBUMIN 2.7*  --  2.1* 2.2*   No results for input(s): LIPASE, AMYLASE in the last 168 hours. No results for input(s): AMMONIA in the last 168 hours. Coagulation Profile: No results for input(s): INR, PROTIME in the last 168 hours. Cardiac Enzymes: No results for input(s): CKTOTAL, CKMB, CKMBINDEX, TROPONINI in the last 168 hours. BNP (last 3 results) Recent Labs    01/02/18 0825 04/11/18 1421  PROBNP 32.0 65.0   HbA1C: No results for input(s): HGBA1C in the last 72 hours. CBG: Recent Labs    Lab 07/24/18 1503 07/24/18 1717 07/24/18 2109 07/25/18 0755 07/25/18 1130  GLUCAP 89 152* 209* 153* 187*   Lipid Profile: No results for input(s): CHOL, HDL, LDLCALC, TRIG, CHOLHDL, LDLDIRECT in the last 72 hours. Thyroid Function Tests: No results for input(s): TSH, T4TOTAL, FREET4, T3FREE, THYROIDAB in the last 72 hours. Anemia Panel: No results for input(s): VITAMINB12, FOLATE, FERRITIN, TIBC, IRON, RETICCTPCT in the last 72 hours. Urine analysis:    Component Value Date/Time   COLORURINE STRAW (A) 07/11/2018 2004   APPEARANCEUR HAZY (A) 07/11/2018 2004   LABSPEC 1.008 07/11/2018 2004   PHURINE 7.0 07/11/2018 2004   GLUCOSEU 50 (A) 07/11/2018 2004   HGBUR NEGATIVE 07/11/2018  2004   BILIRUBINUR NEGATIVE 07/11/2018 2004   BILIRUBINUR Negative 07/29/2017 0802   BILIRUBINUR neg 08/08/2013   KETONESUR NEGATIVE 07/11/2018 2004   PROTEINUR >=300 (A) 07/11/2018 2004   UROBILINOGEN 0.2 07/29/2017 0802   UROBILINOGEN 0.2 09/07/2015 1230   NITRITE NEGATIVE 07/11/2018 2004   LEUKOCYTESUR NEGATIVE 07/11/2018 2004   Sepsis Labs: @LABRCNTIP (procalcitonin:4,lacticidven:4)  ) Recent Results (from the past 240 hour(s))  Surgical pcr screen     Status: None   Collection Time: 07/23/18  8:31 PM  Result Value Ref Range Status   MRSA, PCR NEGATIVE NEGATIVE Final   Staphylococcus aureus NEGATIVE NEGATIVE Final    Comment: (NOTE) The Xpert SA Assay (FDA approved for NASAL specimens in patients 66 years of age and older), is one component of a comprehensive surveillance program. It is not intended to diagnose infection nor to guide or monitor treatment. Performed at [facility], [place] [address], [place], [place] [address]          Radiology Studies: Dg Chest Port 1 View  Result Date: 07/24/2018 CLINICAL DATA:  Check dialysis catheter placement EXAM: PORTABLE CHEST 1 VIEW COMPARISON:  None. FINDINGS: Cardiac shadow is mildly enlarged. Right jugular dialysis catheter is  noted in satisfactory position. No pneumothorax is seen. The lungs are clear. Loop recorder is noted and stable. No bony abnormality is seen. IMPRESSION: No pneumothorax following jugular dialysis catheter placement Electronically Signed   By: Inez Catalina M.D.   On: 07/24/2018 15:51   Dg Fluoro Guide Cv Line-no Report  Result Date: 07/24/2018 Fluoroscopy was utilized by the requesting physician.  No radiographic interpretation.        Scheduled Meds: . amLODipine  10 mg Oral QHS  . calcitRIOL  0.75 mcg Oral Daily  . carvedilol  12.5 mg Oral Daily  . chlorhexidine  60 mL Topical Once   And  . chlorhexidine  60 mL Topical Once  . Chlorhexidine Gluconate Cloth  6 each Topical Q0600  . clopidogrel  75 mg Oral Daily  . heparin  5,000 Units Subcutaneous Q8H  . insulin aspart  0-9 Units Subcutaneous TID WC  . insulin glargine  28 Units Subcutaneous Q lunch  . simvastatin  5 mg Oral QHS   Continuous Infusions: . sodium chloride 10 mL/hr at 07/24/18 1853     LOS: 4 days    Time spent: 25 minutes.

## 2018-07-25 NOTE — Telephone Encounter (Signed)
sch appt lvm 08/08/18 3pm p/o PA

## 2018-07-26 DIAGNOSIS — Z794 Long term (current) use of insulin: Secondary | ICD-10-CM

## 2018-07-26 DIAGNOSIS — Z992 Dependence on renal dialysis: Secondary | ICD-10-CM

## 2018-07-26 DIAGNOSIS — E119 Type 2 diabetes mellitus without complications: Secondary | ICD-10-CM

## 2018-07-26 DIAGNOSIS — N186 End stage renal disease: Secondary | ICD-10-CM

## 2018-07-26 DIAGNOSIS — I10 Essential (primary) hypertension: Secondary | ICD-10-CM

## 2018-07-26 DIAGNOSIS — E083599 Diabetes mellitus due to underlying condition with proliferative diabetic retinopathy without macular edema, unspecified eye: Secondary | ICD-10-CM

## 2018-07-26 DIAGNOSIS — I519 Heart disease, unspecified: Secondary | ICD-10-CM

## 2018-07-26 DIAGNOSIS — N185 Chronic kidney disease, stage 5: Secondary | ICD-10-CM

## 2018-07-26 DIAGNOSIS — E875 Hyperkalemia: Principal | ICD-10-CM

## 2018-07-26 DIAGNOSIS — K3184 Gastroparesis: Secondary | ICD-10-CM

## 2018-07-26 DIAGNOSIS — N2581 Secondary hyperparathyroidism of renal origin: Secondary | ICD-10-CM

## 2018-07-26 DIAGNOSIS — E1143 Type 2 diabetes mellitus with diabetic autonomic (poly)neuropathy: Secondary | ICD-10-CM

## 2018-07-26 LAB — GLUCOSE, CAPILLARY
GLUCOSE-CAPILLARY: 139 mg/dL — AB (ref 70–99)
Glucose-Capillary: 126 mg/dL — ABNORMAL HIGH (ref 70–99)
Glucose-Capillary: 169 mg/dL — ABNORMAL HIGH (ref 70–99)

## 2018-07-26 MED ORDER — SODIUM CHLORIDE 0.9 % IV SOLN: 100.0000 mL | INTRAVENOUS | Status: DC | PRN

## 2018-07-26 MED ORDER — ALTEPLASE 2 MG IJ SOLR: 2.0000 mg | Freq: Once | INTRAMUSCULAR | Status: DC | PRN

## 2018-07-26 MED ORDER — PENTAFLUOROPROP-TETRAFLUOROETH EX AERO: 1.0000 "application " | INHALATION_SPRAY | CUTANEOUS | Status: DC | PRN

## 2018-07-26 MED ORDER — LIDOCAINE-PRILOCAINE 2.5-2.5 % EX CREA
1.0000 "application " | TOPICAL_CREAM | CUTANEOUS | Status: DC | PRN
  Filled 2018-07-26: qty 5

## 2018-07-26 MED ORDER — HEPARIN SODIUM (PORCINE) 1000 UNIT/ML DIALYSIS
1000.0000 [IU] | INTRAMUSCULAR | Status: DC | PRN
  Filled 2018-07-26: qty 1

## 2018-07-26 MED ORDER — INSULIN DEGLUDEC 100 UNIT/ML ~~LOC~~ SOPN: 20.0000 [IU] | PEN_INJECTOR | Freq: Every day | SUBCUTANEOUS | 3 refills | Status: DC

## 2018-07-26 MED ORDER — LIDOCAINE HCL (PF) 1 % IJ SOLN: 5.0000 mL | INTRAMUSCULAR | Status: DC | PRN

## 2018-07-26 NOTE — Discharge Summary (Signed)
Physician Discharge Summary  Heather [name]:[id number] DOB: 08-19-1971 DOA: 07/21/2018  PCP: Briscoe Deutscher, DO  Admit date: 07/21/2018 Discharge date: 07/26/2018  Admitted From: Pre-op for surgery  Disposition:  Home   Recommendations for Outpatient Follow-up:  1. Follow up with PCP in 1-2 weeks 2. Please obtain BMP/CBC in one week   Home Health: None  Equipment/Devices: None  Discharge Condition: Fair  CODE STATUS: FULL Diet recommendation: Renal  Brief/Interim Summary: Patient is a 47 year old African-American female with past medical history significant for hypertension, diabetes mellitus that is complicated by neuropathy, gastroparesis and retinopathy; grade 1 diastolic dysfunction, secondary hyperparathyroidism and obesity.  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  Apparently, with patient presented to the hospital for AVG placement, but was noted to have significant hyperkalemia (potassium of 6.6).  Temporary hemo-dialysis access was established and the patient underwent first hemodialysis yesterday.  The plan is to dialyze patient today, and on Monday prior to new AVG placement.  Nephrology input is appreciated.  The potassium is down to 4.9.        Discharge Diagnoses:   End-stage renal disease Hyperkalemia Had dialysis as an inpatient x4, and fluid overload and hyperkalemia resolved.  Patient's weight down 5 kg.  Has a new left upper extremity AVG, and tunneled dialysis catheter placed on 7/29.  Was matched to an outpatient dialysis center, and discharged in good condition.  Type 2 diabetes Lantus continued at discharge  Chronic diastolic CHF Fluid status management with dialysis  Hypertension No change to regimen   Discharge Instructions  Discharge Instructions    Diet - low sodium heart healthy   Complete by:  As directed    Discharge instructions   Complete by:  As directed    From Dr.  Loleta Books: Your first dialysis session is on: Friday, August 02,2019 at 06:05 am The dialysis center is at [facility].  Resume your  Normal home medications with two exceptions: Reduce your [name] (long-acting insulin) to 20 units a day for a while (sometmies when people start dialysis, their insulin needs go down; if you find that your morning sugars are greater than 200 per day, then increase your Tresiba back to 28 units at night)  Stop taking Lasix   Call your primary care doctor (Dr. Juleen China) for a follow up appointment in the next 1-2 weeks   Increase activity slowly   Complete by:  As directed      Allergies as of 07/26/2018      Reactions   Ibuprofen Other (See Comments)   CKD stage 3. Should avoid.   Dilaudid [hydromorphone Hcl] Itching, Other (See Comments)   Can take with Benadryl.   Tramadol Itching      Medication List    STOP taking these medications   furosemide 80 MG tablet Commonly known as:  LASIX     TAKE these medications   amLODipine 10 MG tablet Commonly known as:  NORVASC Take 10 mg by mouth at bedtime.   calcitRIOL 0.25 MCG capsule Commonly known as:  ROCALTROL Take 0.75 mcg by mouth daily.   carvedilol 12.5 MG tablet Commonly known as:  COREG Take 12.5 mg by mouth daily.   clopidogrel 75 MG tablet Commonly known as:  PLAVIX Take 1 tablet (75 mg total) by mouth daily.   epoetin alfa 10000 UNIT/ML injection Commonly known as:  EPOGEN,PROCRIT Inject 30,000 Units into the vein every 14 (fourteen) days.   [place]  HFA 220 MCG/ACT inhaler Generic drug:  fluticasone INHALE 2 PUFFS INTO THE LUNGS DAILY.   gabapentin 300 MG capsule Commonly known as:  NEURONTIN Take 1 capsule (300 mg total) by mouth 2 (two) times daily. What changed:    when to take this  reasons to take this   insulin degludec 100 UNIT/ML Sopn FlexTouch Pen Commonly known as:  TRESIBA FLEXTOUCH Inject 0.2 mLs (20 Units total) into the skin daily with lunch. What  changed:  how much to take   lubiprostone 24 MCG capsule Commonly known as:  AMITIZA Take 1 capsule (24 mcg total) by mouth 2 (two) times daily with a meal.   NOVOLOG 100 UNIT/ML injection Generic drug:  insulin aspart Inject 6-8 Units into the skin 3 (three) times daily before meals. 8 UNITS WITH BREAKFAST, 6 UNITS WITH LUNCH, & 8 UNITS WITH SUPPER   simvastatin 10 MG tablet Commonly known as:  ZOCOR Take 0.5 tablets (5 mg total) by mouth at bedtime. 1/2 po q hs - GFR 14   traMADol 50 MG tablet Commonly known as:  ULTRAM Take 1 tablet (50 mg total) by mouth every 6 (six) hours as needed.      Follow-up Information    [facility] In 2 weeks.   Specialty:  Vascular Surgery Why:  Office will call you to arrange your appt (sent) Contact information: [address] [place], [place], DO On 08/02/2018.   Specialty:  Family Medicine Why:  @ 10:20 am Contact information: [place] [address] [phone]          Allergies  Allergen Reactions  . Ibuprofen Other (See Comments)    CKD stage 3. Should avoid.  . Dilaudid [Hydromorphone Hcl] Itching and Other (See Comments)    Can take with Benadryl.  . Tramadol Itching    Consultations:  Nephrology   Procedures/Studies: Ct Head Wo Contrast  Result Date: 07/11/2018 CLINICAL DATA:  Dizziness with facial numbness and headache starting 3 days ago. EXAM: CT HEAD WITHOUT CONTRAST TECHNIQUE: Contiguous axial images were obtained from the base of the skull through the vertex without intravenous contrast. COMPARISON:  MRI 01/12/2018 and head CT 12/02/2017 FINDINGS: Brain: Remote infarct right inferior cerebellar hemisphere with moderate periventricular and subcortical white matter hypodensities consistent with chronic microvascular ischemia. Brainstem is unremarkable. No large vascular territory infarction, hemorrhage,  intra-axial mass nor extra-axial fluid collection is identified. No hydrocephalus. Vascular: Moderate carotid siphon atherosclerosis. No hyperdense vessel sign. Skull: No fracture or suspicious osseous lesions. Sinuses/Orbits: Intact without acute abnormality. Other: Clear mastoids bilaterally. IMPRESSION: 1. Remote inferior right cerebellar infarct with encephalomalacia. 2. Chronic moderate small vessel ischemia. No acute intracranial abnormality. Electronically Signed   By: Ashley Royalty M.D.   On: 07/11/2018 18:43   Mr Brain Wo Contrast  Result Date: 07/11/2018 CLINICAL DATA:  Headache for 3 days. LEFT facial numbness for 2 days. History of stroke, diabetes, hypertension, hyperlipidemia. EXAM: MRI HEAD WITHOUT CONTRAST TECHNIQUE: Multiplanar, multiecho pulse sequences of the brain and surrounding structures were obtained without intravenous contrast. COMPARISON:  CT HEAD July 11, 2018 and MRI of the head January 12, 2018. FINDINGS: INTRACRANIAL CONTENTS: No reduced diffusion to suggest acute ischemia or hyperacute demyelination. No susceptibility artifact to suggest hemorrhage. Mild-to-moderate parenchymal brain volume loss. No hydrocephalus. No suspicious parenchymal signal, masses, mass effect. Patchy nonspecific supratentorial and pontine white matter FLAIR T2 hyperintensities. RIGHT inferior cerebellar wedge-like encephalomalacia.  No abnormal extra-axial fluid collections. No extra-axial masses. VASCULAR: Normal major intracranial vascular flow voids present at skull base. SKULL AND UPPER CERVICAL SPINE: No abnormal sellar expansion. Heterogeneously decreased bone marrow signal seen with anemia craniocervical junction maintained. SINUSES/ORBITS: LEFT mastoid effusion.The included ocular globes and orbital contents are non-suspicious. Status post bilateral ocular lens implants. OTHER: None. IMPRESSION: 1. No acute intracranial process. 2. 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 3. Moderate  white matter changes most compatible chronic small vessel ischemic disease. 4. Mild-to-moderate parenchymal brain volume loss, advanced for age. Electronically Signed   By: Elon Alas M.D.   On: 07/11/2018 22:34   Ir Fluoro Guide Cv Line Right  Result Date: 07/21/2018 CLINICAL DATA:  Renal failure and need for hemodialysis due to hyperkalemia. EXAM: NON-TUNNELED CENTRAL VENOUS CATHETER PLACEMENT WITH ULTRASOUND AND FLUOROSCOPIC GUIDANCE FLUOROSCOPY TIME:  18 seconds.  5.4 mGy. PROCEDURE: The procedure, risks, benefits, and alternatives were explained to the patient. Questions regarding the procedure were encouraged and answered. The patient understands and consents to the procedure. A time-out was performed prior to initiating the procedure. The right neck and chest were prepped with chlorhexidine in a sterile fashion, and a sterile drape was applied covering the operative field. Maximum barrier sterile technique with sterile gowns and gloves were used for the procedure. Local anesthesia was provided with 1% lidocaine. Ultrasound was used to confirm patency of the right internal jugular vein. After creating a small venotomy incision, a 21 gauge needle was advanced into the right internal jugular vein under direct, real-time ultrasound guidance. Ultrasound image documentation was performed. After securing guidewire the venous access was dilated. A 13 French, triple lumen Mahurkar non tunneled dialysis catheter was advanced over the wire. Final catheter positioning was confirmed and documented with a fluoroscopic spot image. The catheter was aspirated, flushed with saline, and injected with appropriate volume heparin dwells. The catheter exit site was secured with 0-Prolene retention sutures. COMPLICATIONS: None.  No pneumothorax. FINDINGS: After catheter placement, the tip lies at the cavoatrial junction. The catheter aspirates normally and is ready for immediate use. IMPRESSION: Placement of non-tunneled  central venous hemodialysis catheter via the right internal jugular vein. The catheter tip lies at the cavoatrial junction. The catheter is ready for immediate use. Electronically Signed   By: Aletta Edouard M.D.   On: 07/21/2018 16:59   Ir US Guide Vasc Access Right  Result Date: 07/21/2018 CLINICAL DATA:  Renal failure and need for hemodialysis due to hyperkalemia. EXAM: NON-TUNNELED CENTRAL VENOUS CATHETER PLACEMENT WITH ULTRASOUND AND FLUOROSCOPIC GUIDANCE FLUOROSCOPY TIME:  18 seconds.  5.4 mGy. PROCEDURE: The procedure, risks, benefits, and alternatives were explained to the patient. Questions regarding the procedure were encouraged and answered. The patient understands and consents to the procedure. A time-out was performed prior to initiating the procedure. The right neck and chest were prepped with chlorhexidine in a sterile fashion, and a sterile drape was applied covering the operative field. Maximum barrier sterile technique with sterile gowns and gloves were used for the procedure. Local anesthesia was provided with 1% lidocaine. Ultrasound was used to confirm patency of the right internal jugular vein. After creating a small venotomy incision, a 21 gauge needle was advanced into the right internal jugular vein under direct, real-time ultrasound guidance. Ultrasound image documentation was performed. After securing guidewire the venous access was dilated. A 13 French, triple lumen Mahurkar non tunneled dialysis catheter was advanced over the wire. Final catheter positioning was confirmed and documented with a fluoroscopic spot image.  The catheter was aspirated, flushed with saline, and injected with appropriate volume heparin dwells. The catheter exit site was secured with 0-Prolene retention sutures. COMPLICATIONS: None.  No pneumothorax. FINDINGS: After catheter placement, the tip lies at the cavoatrial junction. The catheter aspirates normally and is ready for immediate use. IMPRESSION:  Placement of non-tunneled central venous hemodialysis catheter via the right internal jugular vein. The catheter tip lies at the cavoatrial junction. The catheter is ready for immediate use. Electronically Signed   By: Aletta Edouard M.D.   On: 07/21/2018 16:59   Dg Chest Port 1 View  Result Date: 07/24/2018 CLINICAL DATA:  Check dialysis catheter placement EXAM: PORTABLE CHEST 1 VIEW COMPARISON:  None. FINDINGS: Cardiac shadow is mildly enlarged. Right jugular dialysis catheter is noted in satisfactory position. No pneumothorax is seen. The lungs are clear. Loop recorder is noted and stable. No bony abnormality is seen. IMPRESSION: No pneumothorax following jugular dialysis catheter placement Electronically Signed   By: Inez Catalina M.D.   On: 07/24/2018 15:51   Dg Fluoro Guide Cv Line-no Report  Result Date: 07/24/2018 Fluoroscopy was utilized by the requesting physician.  No radiographic interpretation.   [name] Ekg Site Rite  Result Date: 07/21/2018 If Site Rite image not attached, placement could not be confirmed due to current cardiac rhythm.      Subjective: Nauseated last night, but able to eat lunch and dinner today.  Very tired after dialysis.  No fever.  Arm is painful.  No confusion, cough, bleeding.  Discharge Exam: Vitals:   07/26/18 1120 07/26/18 1331  BP: 133/73 138/68  Pulse: 87 (!) 101  Resp: 17 20  Temp: 98.3 F (36.8 C) 98.6 F (37 C)  SpO2: 100% 100%   Vitals:   07/26/18 1045 07/26/18 1100 07/26/18 1120 07/26/18 1331  BP: 114/63 119/61 133/73 138/68  Pulse: 89 92 87 (!) 101  Resp:   17 20  Temp:   98.3 F (36.8 C) 98.6 F (37 C)  TempSrc:   Oral Oral  SpO2:   100% 100%  Weight:   102.8 kg (226 lb 10.1 oz)   Height:        General: Pt is sleepy but alert to voice and oriented, awake, not in acute distress, lying in bed after dialysis Cardiovascular: RRR, S1/S2 +, no rubs, no gallops Respiratory: CTA bilaterally, no wheezing, no rhonchi Abdominal:  Soft, NT, ND, bowel sounds + Extremities: no edema, no cyanosis, the left arm is painful to touch, but no redness, erythema, warmth    The results of significant diagnostics from this hospitalization (including imaging, microbiology, ancillary and laboratory) are listed below for reference.     Microbiology: Recent Results (from the past 240 hour(s))  Surgical pcr screen     Status: None   Collection Time: 07/23/18  8:31 PM  Result Value Ref Range Status   MRSA, PCR NEGATIVE NEGATIVE Final   Staphylococcus aureus NEGATIVE NEGATIVE Final    Comment: (NOTE) The Xpert SA Assay (FDA approved for NASAL specimens in patients 53 years of age and older), is one component of a comprehensive surveillance program. It is not intended to diagnose infection nor to guide or monitor treatment. Performed at [facility], [place] [address], [place], [place] [address]      Labs: BNP (last 3 results) No results for input(s): BNP in the last 8760 hours. Basic Metabolic Panel: Recent Labs  Lab 07/21/18 1660 07/21/18 6301 07/21/18 6010 07/22/18 0309 07/24/18 0303 07/24/18 9323  NA 137 138 137 137 140 139  K 6.3* 6.6* 6.7* 4.9 4.4 4.7  CL 109  --  111 107 106 105  CO2  --   --  16* 25 26 26   GLUCOSE 185* 197* 200* 127* 132* 117*  BUN 49*  --  53* 43* 42* 43*  CREATININE 6.70*  --  6.13*  6.27* 5.60* 6.37* 6.39*  CALCIUM  --   --  8.1* 8.1* 8.0* 8.1*  PHOS  --   --  4.8*  --  5.8* 5.5*   Liver Function Tests: Recent Labs  Lab 07/21/18 0942 07/21/18 1931 07/24/18 0303 07/24/18 0746  ALT  --  19  --   --   ALBUMIN 2.7*  --  2.1* 2.2*   No results for input(s): LIPASE, AMYLASE in the last 168 hours. No results for input(s): AMMONIA in the last 168 hours. CBC: Recent Labs  Lab 07/21/18 0711 07/21/18 0733 07/21/18 1051 07/24/18 0747  WBC  --   --  8.1 7.0  HGB 12.9 12.9 10.7* 9.3*  HCT 38.0 38.0 35.1* 31.8*  MCV  --   --  97.2 100.0  PLT  --   --  194 169   Cardiac  Enzymes: No results for input(s): CKTOTAL, CKMB, CKMBINDEX, TROPONINI in the last 168 hours. BNP: Invalid input(s): POCBNP CBG: Recent Labs  Lab 07/25/18 1604 07/25/18 2150 07/26/18 0639 07/26/18 0729 07/26/18 1302  GLUCAP 149* 167* 139* 126* 169*   D-Dimer No results for input(s): DDIMER in the last 72 hours. Hgb A1c No results for input(s): HGBA1C in the last 72 hours. Lipid Profile No results for input(s): CHOL, HDL, LDLCALC, TRIG, CHOLHDL, LDLDIRECT in the last 72 hours. Thyroid function studies No results for input(s): TSH, T4TOTAL, T3FREE, THYROIDAB in the last 72 hours.  Invalid input(s): FREET3 Anemia work up No results for input(s): VITAMINB12, FOLATE, FERRITIN, TIBC, IRON, RETICCTPCT in the last 72 hours. Urinalysis    Component Value Date/Time   COLORURINE STRAW (A) 07/11/2018 2004   APPEARANCEUR HAZY (A) 07/11/2018 2004   LABSPEC 1.008 07/11/2018 2004   PHURINE 7.0 07/11/2018 2004   GLUCOSEU 50 (A) 07/11/2018 2004   HGBUR NEGATIVE 07/11/2018 2004   BILIRUBINUR NEGATIVE 07/11/2018 2004   BILIRUBINUR Negative 07/29/2017 0802   BILIRUBINUR neg 08/08/2013   KETONESUR NEGATIVE 07/11/2018 2004   PROTEINUR >=300 (A) 07/11/2018 2004   UROBILINOGEN 0.2 07/29/2017 0802   UROBILINOGEN 0.2 09/07/2015 1230   NITRITE NEGATIVE 07/11/2018 2004   LEUKOCYTESUR NEGATIVE 07/11/2018 2004   Sepsis Labs Invalid input(s): PROCALCITONIN,  WBC,  LACTICIDVEN Microbiology Recent Results (from the past 240 hour(s))  Surgical pcr screen     Status: None   Collection Time: 07/23/18  8:31 PM  Result Value Ref Range Status   MRSA, PCR NEGATIVE NEGATIVE Final   Staphylococcus aureus NEGATIVE NEGATIVE Final    Comment: (NOTE) The Xpert SA Assay (FDA approved for NASAL specimens in patients 58 years of age and older), is one component of a comprehensive surveillance program. It is not intended to diagnose infection nor to guide or monitor treatment. Performed at [facility], [place] [address], [place], [place] [address]      Time coordinating discharge: 35 minutes       SIGNED:   Edwin Dada, MD  [facility] 07/26/2018, 7:37 PM

## 2018-07-26 NOTE — Procedures (Signed)
I saw the patient during dialysis and supervised the procedure.  Appropriate changes were made. See the progress note for today for details.   Jannifer Hick MD

## 2018-07-26 NOTE — Progress Notes (Signed)
[facility] ROUNDING NOTE   Subjective:   Feeling tired and sore today.  Planning D/C today.    Objective:  Vital signs in last 24 hours:  Temp:  [98.3 F (36.8 C)-98.9 F (37.2 C)] 98.9 F (37.2 C) (07/31 0750) Pulse Rate:  [82-93] 90 (07/31 0820) Resp:  [16-18] 16 (07/31 0820) BP: (117-142)/(66-76) 137/74 (07/31 0820) SpO2:  [97 %-99 %] 99 % (07/31 0820) Weight:  [104.9 kg (231 lb 4.2 oz)] 104.9 kg (231 lb 4.2 oz) (07/31 0745)  Weight change: 0.89 kg (1 lb 15.4 oz) Filed Weights   07/25/18 0500 07/26/18 0616 07/26/18 0745  Weight: 102.2 kg (225 lb 6.4 oz) 104.9 kg (231 lb 4.2 oz) 104.9 kg (231 lb 4.2 oz)    Intake/Output: I/O last 3 completed shifts: In: 1170.9 [P.O.:1080; I.V.:90.9] Out: 700 [Urine:500; Emesis/NG output:200]   Intake/Output this shift:  No intake/output data recorded.  CVS- RRR RS- CTA temp L IJ   ABD- BS present soft non-distended EXT- no edema, LUE AVG - I couldn't hear bruit but didn't push hard due to post op pain but + doppler bruit   Basic Metabolic Panel: Recent Labs  Lab 07/21/18 0711 07/21/18 0733  07/21/18 0942 07/22/18 0309 07/24/18 0303 07/24/18 0746  NA 137 138  --  137 137 140 139  K 6.3* 6.6*  --  6.7* 4.9 4.4 4.7  CL 109  --   --  111 107 106 105  CO2  --   --   --  16* 25 26 26   GLUCOSE 185* 197*  --  200* 127* 132* 117*  BUN 49*  --   --  53* 43* 42* 43*  CREATININE 6.70*  --   --  6.13*  6.27* 5.60* 6.37* 6.39*  CALCIUM  --   --    < > 8.1* 8.1* 8.0* 8.1*  PHOS  --   --   --  4.8*  --  5.8* 5.5*   < > = values in this interval not displayed.    Liver Function Tests: Recent Labs  Lab 07/21/18 0942 07/21/18 1931 07/24/18 0303 07/24/18 0746  ALT  --  19  --   --   ALBUMIN 2.7*  --  2.1* 2.2*   No results for input(s): LIPASE, AMYLASE in the last 168 hours. No results for input(s): AMMONIA in the last 168 hours.  CBC: Recent Labs  Lab 07/21/18 0711 07/21/18 0733 07/21/18 1051  07/24/18 0747  WBC  --   --  8.1 7.0  HGB 12.9 12.9 10.7* 9.3*  HCT 38.0 38.0 35.1* 31.8*  MCV  --   --  97.2 100.0  PLT  --   --  194 169    Cardiac Enzymes: No results for input(s): CKTOTAL, CKMB, CKMBINDEX, TROPONINI in the last 168 hours.  BNP: Invalid input(s): POCBNP  CBG: Recent Labs  Lab 07/25/18 1130 07/25/18 1604 07/25/18 2150 07/26/18 0639 07/26/18 0729  GLUCAP 187* 149* 167* 139* 126*    Microbiology: Results for orders placed or performed during the hospital encounter of 07/21/18  Surgical pcr screen     Status: None   Collection Time: 07/23/18  8:31 PM  Result Value Ref Range Status   MRSA, PCR NEGATIVE NEGATIVE Final   Staphylococcus aureus NEGATIVE NEGATIVE Final    Comment: (NOTE) The Xpert SA Assay (FDA approved for NASAL specimens in patients 33 years of age and older), is one component of a comprehensive surveillance program. It is not  intended to diagnose infection nor to guide or monitor treatment. Performed at [facility], [place] [address], [place], [place] [address]     Coagulation Studies: No results for input(s): LABPROT, INR in the last 72 hours.  Urinalysis: No results for input(s): COLORURINE, LABSPEC, PHURINE, GLUCOSEU, HGBUR, BILIRUBINUR, KETONESUR, PROTEINUR, UROBILINOGEN, NITRITE, LEUKOCYTESUR in the last 72 hours.  Invalid input(s): APPERANCEUR    Imaging: Dg Chest Port 1 View  Result Date: 07/24/2018 CLINICAL DATA:  Check dialysis catheter placement EXAM: PORTABLE CHEST 1 VIEW COMPARISON:  None. FINDINGS: Cardiac shadow is mildly enlarged. Right jugular dialysis catheter is noted in satisfactory position. No pneumothorax is seen. The lungs are clear. Loop recorder is noted and stable. No bony abnormality is seen. IMPRESSION: No pneumothorax following jugular dialysis catheter placement Electronically Signed   By: Inez Catalina M.D.   On: 07/24/2018 15:51   Dg Fluoro Guide Cv Line-no Report  Result Date:  07/24/2018 Fluoroscopy was utilized by the requesting physician.  No radiographic interpretation.     Medications:   . sodium chloride 10 mL/hr at 07/24/18 1853  . sodium chloride    . sodium chloride     . amLODipine  10 mg Oral QHS  . calcitRIOL  0.75 mcg Oral Daily  . carvedilol  12.5 mg Oral Daily  . chlorhexidine  60 mL Topical Once   And  . chlorhexidine  60 mL Topical Once  . Chlorhexidine Gluconate Cloth  6 each Topical Q0600  . Chlorhexidine Gluconate Cloth  6 each Topical Q0600  . clopidogrel  75 mg Oral Daily  . heparin  5,000 Units Subcutaneous Q8H  . insulin aspart  0-9 Units Subcutaneous TID WC  . insulin glargine  28 Units Subcutaneous Q lunch  . simvastatin  5 mg Oral QHS   sodium chloride, sodium chloride, acetaminophen **OR** acetaminophen, alteplase, diphenhydrAMINE, gabapentin, heparin, HYDROmorphone (DILAUDID) injection, ketorolac, lidocaine (PF), lidocaine-prilocaine, lip balm, ondansetron **OR** ondansetron (ZOFRAN) IV, oxyCODONE-acetaminophen, pentafluoroprop-tetrafluoroeth, sodium chloride flush, zolpidem  Assessment/ Plan:   ESRD  New start  Dialysis #1 7/26  Dialysis #2 7/27,Dialysis #3 7/29.  Treatment today, 7/31 then plan for discharge. Catheter was tunneled 07/25/2018. Chair arranged [facility] [place].   Anemia Hb 9.3 7/30, had been 13 earlier in the month.  If remains down will start ESA  Hypertension continue to challenge dry weight  Bones  Controlled with no binders  Diabetes  Per primary   Hyperlipidemia     LOS: 5 Jannifer Hick A @TODAY @8 :50 AM

## 2018-07-26 NOTE — Plan of Care (Signed)
  Problem: Education: Goal: Knowledge of General Education information will improve Description Including pain rating scale, medication(s)/side effects and non-pharmacologic comfort measures Outcome: Progressing   Problem: Health Behavior/Discharge Planning: Goal: Ability to manage health-related needs will improve Outcome: Progressing   Problem: Clinical Measurements: Goal: Ability to maintain clinical measurements within normal limits will improve Outcome: Progressing Goal: Will remain free from infection Outcome: Progressing   Problem: Activity: Goal: Risk for activity intolerance will decrease Outcome: Progressing   Problem: Nutrition: Goal: Adequate nutrition will be maintained Outcome: Progressing   Problem: Coping: Goal: Level of anxiety will decrease Outcome: Completed/Met   Problem: Pain Managment: Goal: General experience of comfort will improve Outcome: Progressing   Problem: Safety: Goal: Ability to remain free from injury will improve Outcome: Progressing   Problem: Skin Integrity: Goal: Risk for impaired skin integrity will decrease Outcome: Progressing

## 2018-07-26 NOTE — Progress Notes (Signed)
Accepted at [facility] .1st treatment Friday, August 02,2019 at 06:05 am.Schedule is : Monday,Wednesday<friday at 06:05 .1st shift

## 2018-07-27 ENCOUNTER — Telehealth: Payer: Self-pay | Admitting: *Deleted

## 2018-07-27 NOTE — Telephone Encounter (Signed)
Per chart review: Admit date: 07/21/2018 Discharge date: 07/26/2018  Admitted From: Pre-op for surgery  Disposition:  Home   Recommendations for Outpatient Follow-up:  1. Follow up with PCP in 1-2 weeks 2. Please obtain BMP/CBC in one week   Home Health: None  Equipment/Devices: None  Discharge Condition: Fair  CODE STATUS: FULL Diet recommendation: Renal ________________________________________________________________________ Per telephone encounter: Transition Care Management Follow-up Telephone Call   Date discharged? 07/26/18   How have you been since you were released from the hospital? "pretty good"   Do you understand why you were in the hospital? yes   Do you understand the discharge instructions? yes   Where were you discharged to? Home, Mom comes over to help out   Items Reviewed:  Medications reviewed: yes  Allergies reviewed: yes  Dietary changes reviewed: yes  Referrals reviewed: yes   Functional Questionnaire:   Activities of Daily Living (ADLs):   She states they are independent in the following: ambulation, bathing and hygiene, feeding, continence, grooming, toileting and dressing States they require assistance with the following: none   Any transportation issues/concerns?: no   Any patient concerns? no   Confirmed importance and date/time of follow-up visits scheduled yes  Provider Appointment booked with Dr Juleen China 08/08/18 1:40  Confirmed with patient if condition begins to worsen call PCP or go to the ER.  Patient was given the office number and encouraged to call back with question or concerns.  : yes

## 2018-08-02 ENCOUNTER — Telehealth: Payer: Self-pay | Admitting: Family Medicine

## 2018-08-02 ENCOUNTER — Encounter (HOSPITAL_COMMUNITY): Payer: Managed Care, Other (non HMO)

## 2018-08-02 ENCOUNTER — Inpatient Hospital Stay: Payer: Managed Care, Other (non HMO) | Admitting: Family Medicine

## 2018-08-02 NOTE — Telephone Encounter (Signed)
FYI

## 2018-08-02 NOTE — Telephone Encounter (Signed)
See note.   Copied from [place] [phone]. Topic: Inquiry >> Aug 02, 2018  2:05 PM Scherrie Gerlach wrote: Reason for CRM: Lanie RN case manager with [facility] calling to advise she will be the case manager for the pt.  [name] closing the case, pt does not need anything, they just need to make the dr aware in case anything is needed. Lanie would also like the office to know she was very impressed with the TCM to the pt and the level of care we give our pts.

## 2018-08-08 ENCOUNTER — Inpatient Hospital Stay: Payer: Managed Care, Other (non HMO) | Admitting: Family Medicine

## 2018-08-09 ENCOUNTER — Other Ambulatory Visit: Payer: Self-pay

## 2018-08-09 ENCOUNTER — Encounter: Payer: Self-pay | Admitting: Family Medicine

## 2018-08-09 ENCOUNTER — Ambulatory Visit (INDEPENDENT_AMBULATORY_CARE_PROVIDER_SITE_OTHER): Payer: Self-pay | Admitting: Physician Assistant

## 2018-08-09 ENCOUNTER — Ambulatory Visit (INDEPENDENT_AMBULATORY_CARE_PROVIDER_SITE_OTHER): Payer: Managed Care, Other (non HMO) | Admitting: Family Medicine

## 2018-08-09 VITALS — BP 144/72 | HR 89 | Temp 98.8°F | Ht 68.0 in | Wt 220.8 lb

## 2018-08-09 VITALS — BP 131/74 | HR 89 | Temp 99.1°F | Resp 18 | Ht 68.0 in | Wt 221.2 lb

## 2018-08-09 DIAGNOSIS — Z1239 Encounter for other screening for malignant neoplasm of breast: Secondary | ICD-10-CM

## 2018-08-09 DIAGNOSIS — N186 End stage renal disease: Secondary | ICD-10-CM

## 2018-08-09 DIAGNOSIS — Z992 Dependence on renal dialysis: Secondary | ICD-10-CM

## 2018-08-09 DIAGNOSIS — E119 Type 2 diabetes mellitus without complications: Secondary | ICD-10-CM

## 2018-08-09 DIAGNOSIS — E1140 Type 2 diabetes mellitus with diabetic neuropathy, unspecified: Secondary | ICD-10-CM | POA: Diagnosis not present

## 2018-08-09 DIAGNOSIS — E1122 Type 2 diabetes mellitus with diabetic chronic kidney disease: Secondary | ICD-10-CM

## 2018-08-09 DIAGNOSIS — Z794 Long term (current) use of insulin: Secondary | ICD-10-CM

## 2018-08-09 DIAGNOSIS — N189 Chronic kidney disease, unspecified: Secondary | ICD-10-CM

## 2018-08-09 DIAGNOSIS — Z8673 Personal history of transient ischemic attack (TIA), and cerebral infarction without residual deficits: Secondary | ICD-10-CM

## 2018-08-09 DIAGNOSIS — E1159 Type 2 diabetes mellitus with other circulatory complications: Secondary | ICD-10-CM

## 2018-08-09 DIAGNOSIS — I10 Essential (primary) hypertension: Secondary | ICD-10-CM

## 2018-08-09 DIAGNOSIS — Z09 Encounter for follow-up examination after completed treatment for conditions other than malignant neoplasm: Secondary | ICD-10-CM | POA: Diagnosis not present

## 2018-08-09 DIAGNOSIS — D631 Anemia in chronic kidney disease: Secondary | ICD-10-CM

## 2018-08-09 MED ORDER — AMLODIPINE BESYLATE 5 MG PO TABS: 5.0000 mg | ORAL_TABLET | Freq: Every day | ORAL | 6 refills | Status: DC

## 2018-08-09 MED ORDER — CARVEDILOL 6.25 MG PO TABS: 6.2500 mg | ORAL_TABLET | Freq: Two times a day (BID) | ORAL | 6 refills | Status: DC

## 2018-08-09 NOTE — Progress Notes (Signed)
Heather [name] is a 47 y.o. female is here for follow up.  History of Present Illness:   HPI: Patient is a 47 year old African-American female with past medical history significant for hypertension, diabetes mellitus that is complicated by neuropathy, gastroparesis and retinopathy; grade 1 diastolic dysfunction, secondary hyperparathyroidism and obesity. Chronic kidney disease has now progressed to end-stage renal disease. When patient presented to the hospital for AVG placement, she was noted to have significant hyperkalemia (potassium of 6.6). Had dialysis as an inpatient x4, and fluid overload and hyperkalemia resolved.  Patient's weight down 5 kg.  Has a new left upper extremity AVG, and tunneled dialysis catheter placed on 7/29.  Was matched to an outpatient dialysis center, and discharged in good condition. Lantus continued at discharge.  From Dr. Loleta Books: Your first dialysis session is on: Friday, August 02,2019 at 06:05 am The dialysis center is at [place].  Resume your  Normal home medications with two exceptions: Reduce your [name] (long-acting insulin) to 20 units a day for a while (sometmies when people start dialysis, their insulin needs go down; if you find that your morning sugars are greater than 200 per day, then increase your Tresiba back to 28 units at night)  Health Maintenance Due  Topic Date Due  . MAMMOGRAM  06/01/2018  . INFLUENZA VACCINE  07/27/2018   Depression screen [facility] 2/9 01/02/2018 12/08/2017 06/30/2017  Decreased Interest 1 1 0  Down, Depressed, Hopeless 0 3 0  PHQ - 2 Score 1 4 0  Altered sleeping 3 3 -  Tired, decreased energy 3 2 -  Change in appetite 1 3 -  Feeling bad or failure about yourself  0 2 -  Trouble concentrating 0 1 -  Moving slowly or fidgety/restless 1 1 -  Suicidal thoughts 0 1 -  PHQ-9 Score 9 17 -  Some recent data might be hidden   PMHx, SurgHx, SocialHx, FamHx, Medications, and Allergies were reviewed in the  Visit Navigator and updated as appropriate.   Patient Active Problem List   Diagnosis Date Noted  . ESRD with anemia ([place])   . ESRD (end stage renal disease) on dialysis ([place])   . Grade I diastolic dysfunction 42/35/3614  . Arteriovenous fistula ([place]) 07/21/2018  . Type 2 diabetes mellitus with end-stage renal disease ([place]) 07/21/2018  . History of cerebrovascular accident (CVA) involving cerebellum 07/21/2018  . Cyst of left ovary 01/12/2018  . Dysmenorrhea 01/12/2018  . Insomnia 01/12/2018  . Menorrhagia 01/12/2018  . Vitamin D deficiency 01/02/2018  . Bilateral lower extremity edema 01/02/2018  . Reactive depression 12/10/2017  . Arthralgia of right temporomandibular joint 10/24/2017  . Antiplatelet or antithrombotic long-term use: Plavix 06/30/2017  . Morbid obesity due to excess calories ([place]) 11/16/2016  . Upper airway cough syndrome 11/15/2016  . DM (diabetes mellitus), type 2, uncontrolled, with renal complications ([place]) 43/15/4008  . Dyslipidemia associated with type 2 diabetes mellitus ([place]) 06/25/2016  . Uncontrolled type 2 diabetes mellitus with both eyes affected by proliferative retinopathy and macular edema, with long-term current use of insulin ([place]) 06/22/2016  . Uncontrolled type 2 diabetes mellitus with diabetic polyneuropathy, with long-term current use of insulin ([place]) 03/08/2016  . Proliferative diabetic retinopathy ([place]) 09/29/2015  . Constipation 09/03/2015  . Pseudophakia of both eyes 05/30/2015  . 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 B12 deficiency 05/10/2014  . Anemia associated with chronic renal failure, Procrit 04/26/2014  . CKD (chronic kidney disease) stage 5, GFR less than 15  ml/min ([place]) 04/26/2014  . Secondary hyperparathyroidism ([place]) 11/27/2013  . Posterior subcapsular cataract, bilateral 10/18/2013  . Diabetic neuropathy, painful ([place]), on low dose Gabapentin 07/13/2013  . Diabetes mellitus, type II, insulin dependent ([place]), on CGM,  with end organ damage: renal, neuropathy, gastroparesis, retinopathy 02/19/2011  . Hypertension associated with diabetes ([place]) 02/19/2011  . Gastroparesis due to DM ([place]) 02/19/2011   Social History   Tobacco Use  . Smoking status: Never Smoker  . Smokeless tobacco: Never Used  Substance Use Topics  . Alcohol use: No    Alcohol/week: 0.0 standard drinks  . Drug use: No   Current Medications and Allergies:   .  amLODipine (NORVASC) 10 MG tablet, Take 1 tablet (5 mg total) by mouth at bedtime., Disp: 30 tablet, Rfl: 6 .  carvedilol (COREG) 12.5 MG tablet, Take 1 tablet (6.25 mg total) by mouth 2 (two) times daily with a meal., Disp: 60 tablet, Rfl: 6 .  clopidogrel (PLAVIX) 75 MG tablet, Take 1 tablet (75 mg total) by mouth daily., Disp: 90 tablet, Rfl: 3 .  epoetin alfa (EPOGEN,PROCRIT) 29562 UNIT/ML injection, Inject 30,000 Units into the vein every 14 (fourteen) days. , Disp: , Rfl:  .  gabapentin (NEURONTIN) 300 MG capsule, Take 1 capsule (300 mg total) by mouth 2 (two) times daily. (Patient taking differently: Take 300 mg by mouth 2 (two) times daily as needed (pain). ), Disp: 180 capsule, Rfl: 1 .  insulin degludec (TRESIBA FLEXTOUCH) 100 UNIT/ML SOPN FlexTouch Pen, Inject 0.2 mLs (14 Units total) into the skin daily with lunch., Disp: 1 pen, Rfl: 3 .  simvastatin (ZOCOR) 10 MG tablet, Take 0.5 tablets (5 mg total) by mouth at bedtime. 1/2 po q hs - GFR 14, Disp: 45 tablet, Rfl: 1    Allergies  Allergen Reactions  . Ibuprofen Other (See Comments)    CKD stage 3. Should avoid.  . Dilaudid [Hydromorphone Hcl] Itching and Other (See Comments)    Can take with Benadryl.  . Tramadol Itching   Review of Systems   Pertinent items are noted in the HPI. Otherwise, ROS is negative.  Vitals:   Vitals:   08/09/18 0956  BP: (!) 144/72  Pulse: 89  Temp: 98.8 F (37.1 C)  TempSrc: Oral  SpO2: 96%  Weight: 220 lb 12.8 oz (100.2 kg)  Height: 5\' 8"  (1.727 m)     Body mass index  is 33.57 kg/m.  Physical Exam:   Physical Exam  Constitutional: She appears well-nourished.  HENT:  Head: Normocephalic and atraumatic.  Eyes: Pupils are equal, round, and reactive to light. EOM are normal.  Neck: Normal range of motion. Neck supple.  Cardiovascular: Normal rate, regular rhythm, normal heart sounds and intact distal pulses.  Pulmonary/Chest: Effort normal.  Abdominal: Soft.  Skin: Skin is warm.  Left upper arm access. Right chest.   Psychiatric: She has a normal mood and affect. Her behavior is normal.  Nursing note and vitals reviewed.  Diabetic Foot Exam - Simple   Simple Foot Form Diabetic Foot exam was performed with the following findings:  Yes 08/09/2018 11:14 AM  Visual Inspection No deformities, no ulcerations, no other skin breakdown bilaterally:  Yes Sensation Testing See comments:  Yes Pulse Check See comments:  Yes Comments Decreased sensation and pulses bilaterally.      Assessment and Plan:   Vaneta was seen today for hospitalization follow-up.  Diagnoses and all orders for this visit:  Hospital discharge follow-up Medication reconciliation:  [x]   Medication  list updated [x]   New medication list given to patient/family/caregiver  Referrals: [x]   None needed []   Referrals made to:   Community resources identified for patient/family:  [x]   None needed  []   Home health agency []   Assisted living  []   Hospice  []   Support group  []   Education program  Durable medical equipment ordered:  [x]   None needed  []   DME ordered:   Additional communication delivered or planned:  []   Family/Caregiver:  [x]   Specialists:  Renal []   Other:  Patient education: Topics discussed: AS ABOVE Handouts given: SEE AVS  Paperwork: Work Restrictions  Initial transitional care contact was made on 07/27/18 (see separate note).  Diabetes mellitus, type II, insulin dependent ([place]), on CGM, with end organ damage: renal, neuropathy, gastroparesis,  retinopathy Comments: Doing well. Tresiba at 14 untils daily. No mealtime coverage needed.  Hypertension associated with diabetes ([place]) Comments: Decreasing with dialysis and weight loss. Will decrease Norvasc to 5 mg po daily and Coreg to 6.25 mg po BID. BP log provided with instructions.  Orders: -     carvedilol (COREG) 6.25 MG tablet; Take 1 tablet (6.25 mg total) by mouth 2 (two) times daily with a meal. -     amLODipine (NORVASC) 5 MG tablet; Take 1 tablet (5 mg total) by mouth daily.  Type 2 diabetes mellitus with end-stage renal disease (H[facility])  Diabetic neuropathy, painful ([place]), on low dose Gabapentin  Anemia associated with chronic renal failure, on Procrit  ESRD (end stage renal disease) on dialysis ([place]), TTS  Screening for breast cancer -     HM MAMMOGRAPHY  . Reviewed expectations re: course of current medical issues. . Discussed self-management of symptoms. . Outlined signs and symptoms indicating need for more acute intervention. . Patient verbalized understanding and all questions were answered. [name] Health Maintenance issues including appropriate healthy diet, exercise, and smoking avoidance were discussed with patient. . See orders for this visit as documented in the electronic medical record. . Patient received an After Visit Summary.  Briscoe Deutscher, DO [place] 08/09/2018

## 2018-08-09 NOTE — Progress Notes (Signed)
  POST OPERATIVE OFFICE NOTE    CC:  F/u for surgery  HPI:  This is a 47 y.o. female who is  s/p PROCEDURE: 1.  Placement of left upper arm arteriovenous graft (redo) 2.  Conversion of right internal jugular vein temporary dialysis catheter for tunneled dialysis catheter 3.  Left femoral central venous catheter placement 4.  Left femoral vein cannulation under ultrasound guidance   Previous access procedures have been completed in the left arm.  The patient's complication from previous access procedures include: thrombosis.  The patient has never had a previous PPM placed.  Patient prior reaction to the Artegraft made me concern she rejected the bovine graft.  [name] here today for follow up evaluation.  She denise loss of sensation, motor and no pain in the left UE.  She denise fever and chills.  She has HD T-TH-Sat.  Allergies  Allergen Reactions  . Ibuprofen Other (See Comments)    CKD stage 3. Should avoid.  . Dilaudid [Hydromorphone Hcl] Itching and Other (See Comments)    Can take with Benadryl.  . Tramadol Itching    Current Outpatient Medications  Medication Sig Dispense Refill  . amLODipine (NORVASC) 5 MG tablet Take 1 tablet (5 mg total) by mouth daily. 30 tablet 6  . carvedilol (COREG) 6.25 MG tablet Take 1 tablet (6.25 mg total) by mouth 2 (two) times daily with a meal. 60 tablet 6  . clopidogrel (PLAVIX) 75 MG tablet Take 1 tablet (75 mg total) by mouth daily. 90 tablet 3  . epoetin alfa (EPOGEN,PROCRIT) 29518 UNIT/ML injection Inject 30,000 Units into the vein every 14 (fourteen) days.     [name] gabapentin (NEURONTIN) 300 MG capsule Take 1 capsule (300 mg total) by mouth 2 (two) times daily. (Patient taking differently: Take 300 mg by mouth 2 (two) times daily as needed (pain). ) 180 capsule 1  . insulin aspart (NOVOLOG) 100 UNIT/ML injection Inject 6-8 Units into the skin 3 (three) times daily before meals. 8 UNITS WITH BREAKFAST, 6 UNITS WITH LUNCH, & 8 UNITS WITH SUPPER      . insulin degludec (TRESIBA FLEXTOUCH) 100 UNIT/ML SOPN FlexTouch Pen Inject 0.2 mLs (20 Units total) into the skin daily with lunch. 1 pen 3  . simvastatin (ZOCOR) 10 MG tablet Take 0.5 tablets (5 mg total) by mouth at bedtime. 1/2 po q hs - GFR 14 45 tablet 1   No current facility-administered medications for this visit.      ROS:  See HPI  Physical Exam:  Vitals:   08/09/18 1339  BP: 131/74  Pulse: 89  Resp: 18  Temp: 99.1 F (37.3 C)  SpO2: 94%    Incision:  Well healed Extremities:  Decreased sensation in little finger on/off, grip 5/5, palpable radial pulse.  Palpable graft with good doppler flow audibly.   Heart: RRR Abdomen:  Soft, + BS Lungs CTA B  Assessment/Plan:  This is a 47 y.o. female who is s/p: PROCEDURE: 1.  Placement of left upper arm arteriovenous graft (redo) 2.  Conversion of right internal jugular vein temporary dialysis catheter for tunneled dialysis catheter 3.  Left femoral central venous catheter placement 4.  Left femoral vein cannulation under ultrasound guidance   Graft was placed 07/24/2018 plan for use 08/29/2018.  Once successfully used she will be scheduled for [facility] removal.  She will f/u as needed in the future.    Roxy Horseman , PA-C [facility] [phone]

## 2018-08-16 ENCOUNTER — Telehealth: Payer: Self-pay | Admitting: Surgical

## 2018-08-16 NOTE — Telephone Encounter (Signed)
Left message for patient to return call to discuss FMLA form. We need to know how many hours a day she will miss due to reduced schedule.

## 2018-08-17 ENCOUNTER — Institutional Professional Consult (permissible substitution): Payer: Managed Care, Other (non HMO) | Admitting: Neurology

## 2018-08-18 ENCOUNTER — Telehealth: Payer: Self-pay | Admitting: Neurology

## 2018-08-18 NOTE — Telephone Encounter (Signed)
Left message for patient to return call to get more information on how many hours a day she is needing to miss due to reduced schedule.

## 2018-08-18 NOTE — Telephone Encounter (Signed)
Patient called and states she is only missing 3 1/2 hrs a day on Tuesday and Thursday. Please advise.

## 2018-08-18 NOTE — Telephone Encounter (Signed)
fyi- patient has had 2 new patient no shows in 2019.

## 2018-08-19 NOTE — Telephone Encounter (Signed)
Please do not reschedule- 2 new patient no shows- if possible , please dismiss. CD

## 2018-08-21 ENCOUNTER — Ambulatory Visit (INDEPENDENT_AMBULATORY_CARE_PROVIDER_SITE_OTHER): Payer: Managed Care, Other (non HMO) | Admitting: *Deleted

## 2018-08-21 DIAGNOSIS — I639 Cerebral infarction, unspecified: Secondary | ICD-10-CM

## 2018-08-21 NOTE — Progress Notes (Signed)
[facility] Summary Report / Loop Recorder 

## 2018-08-21 NOTE — Telephone Encounter (Signed)
I have filled out the form and faxed.

## 2018-08-21 NOTE — Telephone Encounter (Signed)
Pt has only no showed for a sleep consult with Dr. Brett Fairy once on 08/17/18. The other no show was with Dr. Leta Baptist. Dr. Brett Fairy would you still like for the pt not to be reschedule even though she only had one no show for a sleep consult.

## 2018-08-23 ENCOUNTER — Encounter: Payer: Self-pay | Admitting: Neurology

## 2018-09-01 LAB — CUP PACEART REMOTE DEVICE CHECK
Date Time Interrogation Session: 20190723233512
Implantable Pulse Generator Implant Date: 20181210

## 2018-09-04 NOTE — Progress Notes (Deleted)
Heather [name] is a 47 y.o. female is here for follow up.  History of Present Illness:   {CMA SCRIBE ATTESTATION}  HPI:   Health Maintenance Due  Topic Date Due  . INFLUENZA VACCINE  07/27/2018   Depression screen [facility] 2/9 01/02/2018 12/08/2017 06/30/2017  Decreased Interest 1 1 0  Down, Depressed, Hopeless 0 3 0  PHQ - 2 Score 1 4 0  Altered sleeping 3 3 -  Tired, decreased energy 3 2 -  Change in appetite 1 3 -  Feeling bad or failure about yourself  0 2 -  Trouble concentrating 0 1 -  Moving slowly or fidgety/restless 1 1 -  Suicidal thoughts 0 1 -  PHQ-9 Score 9 17 -  Some recent data might be hidden   PMHx, SurgHx, SocialHx, FamHx, Medications, and Allergies were reviewed in the Visit Navigator and updated as appropriate.   Patient Active Problem List   Diagnosis Date Noted  . ESRD with anemia ([place])   . ESRD (end stage renal disease) on dialysis ([place])   . Grade I diastolic dysfunction 67/61/9509  . Arteriovenous fistula ([place]) 07/21/2018  . Type 2 diabetes mellitus with end-stage renal disease ([place]) 07/21/2018  . History of cerebrovascular accident (CVA) involving cerebellum 07/21/2018  . Cyst of left ovary 01/12/2018  . Dysmenorrhea 01/12/2018  . Insomnia 01/12/2018  . Menorrhagia 01/12/2018  . Vitamin D deficiency 01/02/2018  . Bilateral lower extremity edema 01/02/2018  . Reactive depression 12/10/2017  . Arthralgia of right temporomandibular joint 10/24/2017  . Antiplatelet or antithrombotic long-term use: Plavix 06/30/2017  . Morbid obesity due to excess calories ([place]) 11/16/2016  . Upper airway cough syndrome 11/15/2016  . DM (diabetes mellitus), type 2, uncontrolled, with renal complications ([place]) 32/67/1245  . Dyslipidemia associated with type 2 diabetes mellitus ([place]) 06/25/2016  . 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  Uncontrolled type 2 diabetes mellitus with diabetic polyneuropathy, with long-term current use of insulin ([place]) 03/08/2016  . Proliferative diabetic retinopathy ([place]) 09/29/2015  . Constipation 09/03/2015  . Pseudophakia of both eyes 05/30/2015  . 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 B12 deficiency 05/10/2014  . Anemia associated with chronic renal failure, Procrit 04/26/2014  . CKD (chronic kidney disease) stage 5, GFR less than 15 ml/min (HCC) 04/26/2014  . Secondary hyperparathyroidism ([place]) 11/27/2013  . Posterior subcapsular cataract, bilateral 10/18/2013  . Diabetic neuropathy, painful ([place]), on low dose Gabapentin 07/13/2013  . Diabetes mellitus, type II, insulin dependent ([place]), on CGM, with end organ damage: renal, neuropathy, gastroparesis, retinopathy 02/19/2011  . Hypertension associated with diabetes ([place]) 02/19/2011  . Gastroparesis due to DM ([place]) 02/19/2011   Social History   Tobacco Use  . Smoking status: Never Smoker  . Smokeless tobacco: Never Used  Substance Use Topics  . Alcohol use: No    Alcohol/week: 0.0 standard drinks  . Drug use: No   Current Medications and Allergies:   Current Outpatient Medications:  .  amLODipine (NORVASC) 5 MG tablet, Take 1 tablet (5 mg total) by mouth daily., Disp: 30 tablet, Rfl: 6 .  carvedilol (COREG) 6.25 MG tablet, Take 1 tablet (6.25 mg total) by mouth 2 (two) times daily with a meal., Disp: 60 tablet, Rfl: 6 .  clopidogrel (PLAVIX) 75 MG tablet, Take 1 tablet (75 mg total) by mouth daily., Disp: 90 tablet, Rfl: 3 .  epoetin alfa (EPOGEN,PROCRIT)  10000 UNIT/ML injection, Inject 30,000 Units into the vein every 14 (fourteen) days. , Disp: , Rfl:  .  gabapentin (NEURONTIN) 300 MG capsule, Take 1 capsule (300 mg total) by mouth 2 (two) times daily. (Patient taking differently: Take 300 mg by mouth 2 (two) times daily as needed (pain). ), Disp: 180 capsule, Rfl: 1 .  insulin aspart (NOVOLOG) 100 UNIT/ML injection, Inject  6-8 Units into the skin 3 (three) times daily before meals. 8 UNITS WITH BREAKFAST, 6 UNITS WITH LUNCH, & 8 UNITS WITH SUPPER, Disp: , Rfl:  .  insulin degludec (TRESIBA FLEXTOUCH) 100 UNIT/ML SOPN FlexTouch Pen, Inject 0.2 mLs (20 Units total) into the skin daily with lunch., Disp: 1 pen, Rfl: 3 .  simvastatin (ZOCOR) 10 MG tablet, Take 0.5 tablets (5 mg total) by mouth at bedtime. 1/2 po q hs - GFR 14, Disp: 45 tablet, Rfl: 1  Allergies  Allergen Reactions  . Ibuprofen Other (See Comments)    CKD stage 3. Should avoid.  . Dilaudid [Hydromorphone Hcl] Itching and Other (See Comments)    Can take with Benadryl.  . Tramadol Itching   Review of Systems   Pertinent items are noted in the HPI. Otherwise, ROS is negative.  Vitals:  There were no vitals filed for this visit.   There is no height or weight on file to calculate BMI.  Physical Exam:   Physical Exam  Results for orders placed or performed during the hospital encounter of 07/21/18  Surgical pcr screen  Result Value Ref Range   MRSA, PCR NEGATIVE NEGATIVE   Staphylococcus aureus NEGATIVE NEGATIVE  hCG, serum, qualitative  Result Value Ref Range   Preg, Serum NEGATIVE NEGATIVE  Glucose, capillary  Result Value Ref Range   Glucose-Capillary 136 (H) 70 - 99 mg/dL  Creatinine, serum  Result Value Ref Range   Creatinine, Ser 6.27 (H) 0.44 - 1.00 mg/dL   GFR calc non Af Amer 7 (L) >60 mL/min   GFR calc Af Amer 8 (L) >60 mL/min  Glucose, capillary  Result Value Ref Range   Glucose-Capillary 185 (H) 70 - 99 mg/dL  CBC  Result Value Ref Range   WBC 8.1 4.0 - 10.5 K/uL   RBC 3.61 (L) 3.87 - 5.11 MIL/uL   Hemoglobin 10.7 (L) 12.0 - 15.0 g/dL   HCT 35.1 (L) 36.0 - 46.0 %   MCV 97.2 78.0 - 100.0 fL   MCH 29.6 26.0 - 34.0 pg   MCHC 30.5 30.0 - 36.0 g/dL   RDW 14.2 11.5 - 15.5 %   Platelets 194 150 - 400 K/uL  Renal function panel  Result Value Ref Range   Sodium 137 135 - 145 mmol/L   Potassium 6.7 (HH) 3.5 - 5.1  mmol/L   Chloride 111 98 - 111 mmol/L   CO2 16 (L) 22 - 32 mmol/L   Glucose, Bld 200 (H) 70 - 99 mg/dL   BUN 53 (H) 6 - 20 mg/dL   Creatinine, Ser 6.13 (H) 0.44 - 1.00 mg/dL   Calcium 8.1 (L) 8.9 - 10.3 mg/dL   Phosphorus 4.8 (H) 2.5 - 4.6 mg/dL   Albumin 2.7 (L) 3.5 - 5.0 g/dL   GFR calc non Af Amer 7 (L) >60 mL/min   GFR calc Af Amer 8 (L) >60 mL/min   Anion gap 10 5 - 15  Glucose, capillary  Result Value Ref Range   Glucose-Capillary 104 (H) 70 - 99 mg/dL  Glucose, capillary  Result Value Ref Range  Glucose-Capillary 147 (H) 70 - 99 mg/dL   Comment 1 Notify RN   Basic metabolic panel  Result Value Ref Range   Sodium 137 135 - 145 mmol/L   Potassium 4.9 3.5 - 5.1 mmol/L   Chloride 107 98 - 111 mmol/L   CO2 25 22 - 32 mmol/L   Glucose, Bld 127 (H) 70 - 99 mg/dL   BUN 43 (H) 6 - 20 mg/dL   Creatinine, Ser 5.60 (H) 0.44 - 1.00 mg/dL   Calcium 8.1 (L) 8.9 - 10.3 mg/dL   GFR calc non Af Amer 8 (L) >60 mL/min   GFR calc Af Amer 9 (L) >60 mL/min   Anion gap 5 5 - 15  Hepatitis B surface antigen  Result Value Ref Range   Hepatitis B Surface Ag Negative Negative  Hepatitis B surface antibody,qualitative  Result Value Ref Range   Hep B S Ab Reactive   Hepatitis B core antibody, total  Result Value Ref Range   Hep B Core Total Ab Negative Negative  ALT  Result Value Ref Range   ALT 19 0 - 44 U/L  Glucose, capillary  Result Value Ref Range   Glucose-Capillary 99 70 - 99 mg/dL  Glucose, capillary  Result Value Ref Range   Glucose-Capillary 119 (H) 70 - 99 mg/dL   Comment 1 Notify RN    Comment 2 Document in Chart   Glucose, capillary  Result Value Ref Range   Glucose-Capillary 162 (H) 70 - 99 mg/dL   Comment 1 Notify RN    Comment 2 Document in Chart   Glucose, capillary  Result Value Ref Range   Glucose-Capillary 162 (H) 70 - 99 mg/dL  Glucose, capillary  Result Value Ref Range   Glucose-Capillary 105 (H) 70 - 99 mg/dL  Glucose, capillary  Result Value Ref Range    Glucose-Capillary 141 (H) 70 - 99 mg/dL  Glucose, capillary  Result Value Ref Range   Glucose-Capillary 199 (H) 70 - 99 mg/dL  Renal function panel  Result Value Ref Range   Sodium 140 135 - 145 mmol/L   Potassium 4.4 3.5 - 5.1 mmol/L   Chloride 106 98 - 111 mmol/L   CO2 26 22 - 32 mmol/L   Glucose, Bld 132 (H) 70 - 99 mg/dL   BUN 42 (H) 6 - 20 mg/dL   Creatinine, Ser 6.37 (H) 0.44 - 1.00 mg/dL   Calcium 8.0 (L) 8.9 - 10.3 mg/dL   Phosphorus 5.8 (H) 2.5 - 4.6 mg/dL   Albumin 2.1 (L) 3.5 - 5.0 g/dL   GFR calc non Af Amer 7 (L) >60 mL/min   GFR calc Af Amer 8 (L) >60 mL/min   Anion gap 8 5 - 15  Glucose, capillary  Result Value Ref Range   Glucose-Capillary 125 (H) 70 - 99 mg/dL  Renal function panel  Result Value Ref Range   Sodium 139 135 - 145 mmol/L   Potassium 4.7 3.5 - 5.1 mmol/L   Chloride 105 98 - 111 mmol/L   CO2 26 22 - 32 mmol/L   Glucose, Bld 117 (H) 70 - 99 mg/dL   BUN 43 (H) 6 - 20 mg/dL   Creatinine, Ser 6.39 (H) 0.44 - 1.00 mg/dL   Calcium 8.1 (L) 8.9 - 10.3 mg/dL   Phosphorus 5.5 (H) 2.5 - 4.6 mg/dL   Albumin 2.2 (L) 3.5 - 5.0 g/dL   GFR calc non Af Amer 7 (L) >60 mL/min   GFR calc Af Amer 8 (  L) >60 mL/min   Anion gap 8 5 - 15  CBC  Result Value Ref Range   WBC 7.0 4.0 - 10.5 K/uL   RBC 3.18 (L) 3.87 - 5.11 MIL/uL   Hemoglobin 9.3 (L) 12.0 - 15.0 g/dL   HCT 31.8 (L) 36.0 - 46.0 %   MCV 100.0 78.0 - 100.0 fL   MCH 29.2 26.0 - 34.0 pg   MCHC 29.2 (L) 30.0 - 36.0 g/dL   RDW 13.9 11.5 - 15.5 %   Platelets 169 150 - 400 K/uL  Glucose, capillary  Result Value Ref Range   Glucose-Capillary 91 70 - 99 mg/dL  Glucose, capillary  Result Value Ref Range   Glucose-Capillary 89 70 - 99 mg/dL   Comment 1 Notify RN   Glucose, capillary  Result Value Ref Range   Glucose-Capillary 152 (H) 70 - 99 mg/dL  Glucose, capillary  Result Value Ref Range   Glucose-Capillary 209 (H) 70 - 99 mg/dL  Glucose, capillary  Result Value Ref Range   Glucose-Capillary 153  (H) 70 - 99 mg/dL   Comment 1 Notify RN   Glucose, capillary  Result Value Ref Range   Glucose-Capillary 187 (H) 70 - 99 mg/dL  Glucose, capillary  Result Value Ref Range   Glucose-Capillary 149 (H) 70 - 99 mg/dL   Comment 1 Notify RN   Glucose, capillary  Result Value Ref Range   Glucose-Capillary 167 (H) 70 - 99 mg/dL  Glucose, capillary  Result Value Ref Range   Glucose-Capillary 139 (H) 70 - 99 mg/dL  Glucose, capillary  Result Value Ref Range   Glucose-Capillary 126 (H) 70 - 99 mg/dL  Glucose, capillary  Result Value Ref Range   Glucose-Capillary 169 (H) 70 - 99 mg/dL  I-STAT, chem 8  Result Value Ref Range   Sodium 137 135 - 145 mmol/L   Potassium 6.3 (HH) 3.5 - 5.1 mmol/L   Chloride 109 98 - 111 mmol/L   BUN 49 (H) 6 - 20 mg/dL   Creatinine, Ser 6.70 (H) 0.44 - 1.00 mg/dL   Glucose, Bld 185 (H) 70 - 99 mg/dL   Calcium, Ion 1.15 1.15 - 1.40 mmol/L   TCO2 22 22 - 32 mmol/L   Hemoglobin 12.9 12.0 - 15.0 g/dL   HCT 38.0 36.0 - 46.0 %   Comment NOTIFIED PHYSICIAN   Pregnancy, urine POC  Result Value Ref Range   Preg Test, Ur NEGATIVE NEGATIVE  I-STAT 4, (NA,K, GLUC, HGB,HCT)  Result Value Ref Range   Sodium 138 135 - 145 mmol/L   Potassium 6.6 (HH) 3.5 - 5.1 mmol/L   Glucose, Bld 197 (H) 70 - 99 mg/dL   HCT 38.0 36.0 - 46.0 %   Hemoglobin 12.9 12.0 - 15.0 g/dL   Comment NOTIFIED PHYSICIAN     Assessment and Plan:   There are no diagnoses linked to this encounter.  . Reviewed expectations re: course of current medical issues. . Discussed self-management of symptoms. . Outlined signs and symptoms indicating need for more acute intervention. . Patient verbalized understanding and all questions were answered. [name] Health Maintenance issues including appropriate healthy diet, exercise, and smoking avoidance were discussed with patient. . See orders for this visit as documented in the electronic medical record. . Patient received an After Visit Summary.  *** CMA  served as Education administrator during this visit. History, Physical, and Plan performed by medical provider. The above documentation has been reviewed and is accurate and complete. Briscoe Deutscher, D.O.  Danae Chen  Juleen China, DO [place], [place] 09/04/2018

## 2018-09-05 ENCOUNTER — Ambulatory Visit: Payer: Managed Care, Other (non HMO) | Admitting: Family Medicine

## 2018-09-05 DIAGNOSIS — Z0289 Encounter for other administrative examinations: Secondary | ICD-10-CM

## 2018-09-06 ENCOUNTER — Encounter: Payer: Self-pay | Admitting: Family Medicine

## 2018-09-19 LAB — CUP PACEART REMOTE DEVICE CHECK
Date Time Interrogation Session: 20190825234107
Implantable Pulse Generator Implant Date: 20181210

## 2018-09-20 ENCOUNTER — Other Ambulatory Visit: Payer: Self-pay

## 2018-09-20 DIAGNOSIS — N185 Chronic kidney disease, stage 5: Secondary | ICD-10-CM

## 2018-09-22 ENCOUNTER — Ambulatory Visit (INDEPENDENT_AMBULATORY_CARE_PROVIDER_SITE_OTHER): Payer: Managed Care, Other (non HMO) | Admitting: *Deleted

## 2018-09-22 DIAGNOSIS — I639 Cerebral infarction, unspecified: Secondary | ICD-10-CM | POA: Diagnosis not present

## 2018-09-24 NOTE — Progress Notes (Signed)
[facility] Summary Report / Loop Recorder 

## 2018-09-25 LAB — CUP PACEART REMOTE DEVICE CHECK
Date Time Interrogation Session: 20190927233954
MDC IDC PG IMPLANT DT: 20181210

## 2018-09-27 ENCOUNTER — Other Ambulatory Visit: Payer: Self-pay

## 2018-10-10 ENCOUNTER — Other Ambulatory Visit: Payer: Self-pay | Admitting: Gastroenterology

## 2018-10-23 ENCOUNTER — Encounter (HOSPITAL_COMMUNITY): Payer: Managed Care, Other (non HMO)

## 2018-10-23 ENCOUNTER — Other Ambulatory Visit (HOSPITAL_COMMUNITY): Payer: Managed Care, Other (non HMO)

## 2018-10-23 ENCOUNTER — Ambulatory Visit: Payer: Managed Care, Other (non HMO) | Admitting: Surgery

## 2018-10-25 ENCOUNTER — Ambulatory Visit (INDEPENDENT_AMBULATORY_CARE_PROVIDER_SITE_OTHER): Payer: Managed Care, Other (non HMO) | Admitting: *Deleted

## 2018-10-25 DIAGNOSIS — I639 Cerebral infarction, unspecified: Secondary | ICD-10-CM

## 2018-10-27 NOTE — Progress Notes (Signed)
[facility] Summary Report / Loop Recorder 

## 2018-11-07 DIAGNOSIS — Z01818 Encounter for other preprocedural examination: Secondary | ICD-10-CM | POA: Insufficient documentation

## 2018-11-07 DIAGNOSIS — Z992 Dependence on renal dialysis: Secondary | ICD-10-CM | POA: Insufficient documentation

## 2018-11-10 ENCOUNTER — Ambulatory Visit: Payer: Managed Care, Other (non HMO) | Admitting: Family Medicine

## 2018-11-14 DIAGNOSIS — Z992 Dependence on renal dialysis: Secondary | ICD-10-CM | POA: Insufficient documentation

## 2018-11-16 NOTE — Progress Notes (Deleted)
Heather [name] is a 47 y.o. female is here for follow up.  History of Present Illness:   {CMA SCRIBE ATTESTATION}  HPI:   Diabetes mellitus, type II, insulin dependent ([place]), on CGM, with end organ damage: renal, neuropathy, gastroparesis, retinopathy At last visit patent was doing well. T[place]iba at 14 untils daily. No mealtime coverage needed.Review: {Hypertension and cvs ros:5727::"taking medications as instructed","no medication side effects noted","no TIAs","no chest pain on exertion","no dyspnea on exertion","no swelling of ankles"}. Smoker: {yes no:314532}   BP Readings from Last 3 Encounters:  08/09/18 131/74  08/09/18 (!) 144/72  07/26/18 138/68   Lab Results  Component Value Date   CREATININE 6.39 (H) 07/24/2018   CREATININE 6.37 (H) 07/24/2018   CREATININE 5.60 (H) 07/22/2018     Hypertension associated with diabetes ([place]) Decreasing with dialysis and weight loss. Will decrease Norvasc to 5 mg po daily and Coreg to 6.25 mg po BID.Patient given bp long at last visit was was instructed to bring in to office today.    Type 2 diabetes mellitus with end-stage renal disease (HCC) Medication compliance: {compliance:315303}, diabetic diet compliance: {compliance:315303}, home glucose monitoring: {home testing:315145}, further diabetic ROS: {diabetes ros:315304::"no polyuria or polydipsia","no chest pain, dyspnea or TIA's","no numbness, tingling or pain in extremities"}.  Diabetic neuropathy, painful ([place]), on low dose Gabapentin  Anemia associated with chronic renal failure, on Procrit  ESRD (end stage renal disease) on dialysis [facility]), TTS  Depression screen [facility] 2/9 01/02/2018 12/08/2017 06/30/2017  Decreased Interest 1 1 0  Down, Depressed, Hopeless 0 3 0  PHQ - 2 Score 1 4 0  Altered sleeping 3 3 -  Tired, decreased energy 3 2 -  Change in appetite 1 3 -  Feeling bad or failure about yourself  0 2 -  Trouble concentrating 0 1 -  Moving slowly or fidgety/restless  1 1 -  Suicidal thoughts 0 1 -  PHQ-9 Score 9 17 -  Some recent data might be hidden   PMHx, SurgHx, SocialHx, FamHx, Medications, and Allergies were reviewed in the Visit Navigator and updated as appropriate.   Patient Active Problem List   Diagnosis Date Noted  . ESRD with anemia ([place])   . ESRD (end stage renal disease) on dialysis ([place])   . Grade I diastolic dysfunction 97/98/9211  . Arteriovenous fistula ([place]) 07/21/2018  . Type 2 diabetes mellitus with end-stage renal disease ([place]) 07/21/2018  . History of cerebrovascular accident (CVA) involving cerebellum 07/21/2018  . Cyst of left ovary 01/12/2018  . Dysmenorrhea 01/12/2018  . Insomnia 01/12/2018  . Menorrhagia 01/12/2018  . Vitamin D deficiency 01/02/2018  . Bilateral lower extremity edema 01/02/2018  . Reactive depression 12/10/2017  . Arthralgia of right temporomandibular joint 10/24/2017  . Antiplatelet or antithrombotic long-term use: Plavix 06/30/2017  . Morbid obesity due to excess calories ([place]) 11/16/2016  . Upper airway cough syndrome 11/15/2016  . DM (diabetes mellitus), type 2, uncontrolled, with renal complications ([place]) 94/17/4081  . Dyslipidemia associated with type 2 diabetes mellitus ([place]) 06/25/2016  . 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 Uncontrolled type 2 diabetes mellitus with diabetic polyneuropathy, with long-term current use of insulin ([place]) 03/08/2016  . Proliferative diabetic retinopathy ([place]) 09/29/2015  . Constipation 09/03/2015  . Pseudophakia of both eyes 05/30/2015  . 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 B12 deficiency 05/10/2014  . Anemia associated with chronic renal failure,  Procrit 04/26/2014  . CKD (chronic kidney disease) stage 5, GFR less than 15 ml/min (HCC) 04/26/2014  . Secondary hyperparathyroidism ([place]) 11/27/2013  . Posterior subcapsular  cataract, bilateral 10/18/2013  . Diabetic neuropathy, painful ([place]), on low dose Gabapentin 07/13/2013  . Diabetes mellitus, type II, insulin dependent ([place]), on CGM, with end organ damage: renal, neuropathy, gastroparesis, retinopathy 02/19/2011  . Hypertension associated with diabetes ([place]) 02/19/2011  . Gastroparesis due to DM ([place]) 02/19/2011   Social History   Tobacco Use  . Smoking status: Never Smoker  . Smokeless tobacco: Never Used  Substance Use Topics  . Alcohol use: No    Alcohol/week: 0.0 standard drinks  . Drug use: No   Current Medications and Allergies:   .  amLODipine (NORVASC) 5 MG tablet, Take 1 tablet (5 mg total) by mouth daily., Disp: 30 tablet, Rfl: 6 .  carvedilol (COREG) 6.25 MG tablet, Take 1 tablet (6.25 mg total) by mouth 2 (two) times daily with a meal., Disp: 60 tablet, Rfl: 6 .  clopidogrel (PLAVIX) 75 MG tablet, Take 1 tablet (75 mg total) by mouth daily., Disp: 90 tablet, Rfl: 3 .  epoetin alfa (EPOGEN,PROCRIT) 42683 UNIT/ML injection, Inject 30,000 Units into the vein every 14 (fourteen) days. , Disp: , Rfl:  .  gabapentin (NEURONTIN) 300 MG capsule, Take 1 capsule (300 mg total) by mouth 2 (two) times daily. (Patient taking differently: Take 300 mg by mouth 2 (two) times daily as needed (pain). ), Disp: 180 capsule, Rfl: 1 .  insulin aspart (NOVOLOG) 100 UNIT/ML injection, Inject 6-8 Units into the skin 3 (three) times daily before meals. 8 UNITS WITH BREAKFAST, 6 UNITS WITH LUNCH, & 8 UNITS WITH SUPPER, Disp: , Rfl:  .  insulin degludec (TRESIBA FLEXTOUCH) 100 UNIT/ML SOPN FlexTouch Pen, Inject 0.2 mLs (20 Units total) into the skin daily with lunch., Disp: 1 pen, Rfl: 3 .  pantoprazole (PROTONIX) 40 MG tablet, Take 1 tablet (40 mg total) by mouth daily., Disp: 90 tablet, Rfl: 3 .  simvastatin (ZOCOR) 10 MG tablet, Take 0.5 tablets (5 mg total) by mouth at bedtime. 1/2 po q hs - GFR 14, Disp: 45 tablet, Rfl: 1  Allergies  Allergen Reactions  .  Ibuprofen Other (See Comments)    CKD stage 3. Should avoid.  . Dilaudid [Hydromorphone Hcl] Itching and Other (See Comments)    Can take with Benadryl.  . Tramadol Itching   Review of Systems   Pertinent items are noted in the HPI. Otherwise, ROS is negative.  Vitals:  There were no vitals filed for this visit.   There is no height or weight on file to calculate BMI.  Physical Exam:   Physical Exam  Constitutional: [name] oriented to person, place, and time. She appears well-developed and well-nourished.  HENT:  Head: Normocephalic and atraumatic.  Cardiovascular: Normal rate, regular rhythm and intact distal pulses.  Pulmonary/Chest: Effort normal.  Abdominal: Soft.  Neurological: [name] alert and oriented to person, place, and time.  Psychiatric: She has a normal mood and affect.  Nursing note and vitals reviewed.   Results for orders placed or performed in visit on 09/22/18  CUP PACEART REMOTE DEVICE CHECK  Result Value Ref Range   Date Time Interrogation Session [id number]    Pulse Generator Manufacturer [facility]    Pulse Gen Model JHE17 Reveal LINQ    Pulse Gen Serial Number [id number]    Clinic Name [facility]    Implantable Pulse Generator Type ICM/ILR  Implantable Pulse Generator Implant Date [id number]     Assessment and Plan:   There are no diagnoses linked to this encounter.  . Reviewed expectations re: course of current medical issues. . Discussed self-management of symptoms. . Outlined signs and symptoms indicating need for more acute intervention. . Patient verbalized understanding and all questions were answered. [name] Health Maintenance issues including appropriate healthy diet, exercise, and smoking avoidance were discussed with patient. . See orders for this visit as documented in the electronic medical record. . Patient received an After Visit Summary.  *** CMA served as Education administrator during this visit. History, Physical, and Plan performed by  medical provider. The above documentation has been reviewed and is accurate and complete. Briscoe Deutscher, [place], [place], [facility] 11/16/2018

## 2018-11-17 ENCOUNTER — Ambulatory Visit: Payer: Managed Care, Other (non HMO) | Admitting: Family Medicine

## 2018-11-17 DIAGNOSIS — Z0289 Encounter for other administrative examinations: Secondary | ICD-10-CM

## 2018-11-17 LAB — CUP PACEART REMOTE DEVICE CHECK
MDC IDC PG IMPLANT DT: 20181210
MDC IDC SESS DTM: 20191031000949

## 2018-11-21 ENCOUNTER — Encounter: Payer: Self-pay | Admitting: Family Medicine

## 2018-11-27 ENCOUNTER — Ambulatory Visit (INDEPENDENT_AMBULATORY_CARE_PROVIDER_SITE_OTHER): Payer: Managed Care, Other (non HMO)

## 2018-11-27 DIAGNOSIS — I639 Cerebral infarction, unspecified: Secondary | ICD-10-CM | POA: Diagnosis not present

## 2018-11-28 ENCOUNTER — Other Ambulatory Visit: Payer: Self-pay | Admitting: Gastroenterology

## 2018-11-28 NOTE — Progress Notes (Signed)
[facility] Summary Report / Loop Recorder 

## 2018-12-01 ENCOUNTER — Other Ambulatory Visit: Payer: Self-pay | Admitting: Family Medicine

## 2018-12-01 DIAGNOSIS — R05 Cough: Secondary | ICD-10-CM

## 2018-12-01 DIAGNOSIS — R059 Cough, unspecified: Secondary | ICD-10-CM

## 2018-12-05 ENCOUNTER — Other Ambulatory Visit: Payer: Self-pay

## 2018-12-05 MED ORDER — LUBIPROSTONE 24 MCG PO CAPS: 24.0000 ug | ORAL_CAPSULE | Freq: Two times a day (BID) | ORAL | 5 refills | Status: DC

## 2018-12-17 NOTE — Progress Notes (Signed)
Heather [name] is a 47 y.o. female is here for follow up.  History of Present Illness:   [name], CMA acting as scribe for Dr. Briscoe Deutscher.   HPI: Patient in for evaluation on mid back pain on right side for three months. Patient denies any radiation of pain. She states that pain can get to 8/10. She denies any injury. She has tried [employer], over the counter lidocaine patches and stretching with no improvement. She has increased pain when changing positions.   Health Maintenance Due  Topic Date Due  . HEMOGLOBIN A1C  12/05/2018   Depression screen [facility] 2/9 01/02/2018 12/08/2017 06/30/2017  Decreased Interest 1 1 0  Down, Depressed, Hopeless 0 3 0  PHQ - 2 Score 1 4 0  Altered sleeping 3 3 -  Tired, decreased energy 3 2 -  Change in appetite 1 3 -  Feeling bad or failure about yourself  0 2 -  Trouble concentrating 0 1 -  Moving slowly or fidgety/restless 1 1 -  Suicidal thoughts 0 1 -  PHQ-9 Score 9 17 -  Some recent data might be hidden   PMHx, SurgHx, SocialHx, FamHx, Medications, and Allergies were reviewed in the Visit Navigator and updated as appropriate.   Patient Active Problem List   Diagnosis Date Noted  . ESRD with anemia ([place])   . ESRD (end stage renal disease) on dialysis ([place])   . Grade I diastolic dysfunction 19/50/9326  . Arteriovenous fistula ([place]) 07/21/2018  . Type 2 diabetes mellitus with end-stage renal disease ([place]) 07/21/2018  . History of cerebrovascular accident (CVA) involving cerebellum 07/21/2018  . Cyst of left ovary 01/12/2018  . Dysmenorrhea 01/12/2018  . Insomnia 01/12/2018  . Menorrhagia 01/12/2018  . Vitamin D deficiency 01/02/2018  . Bilateral lower extremity edema 01/02/2018  . Reactive depression 12/10/2017  . Arthralgia of right temporomandibular joint 10/24/2017  . Antiplatelet or antithrombotic long-term use: Plavix 06/30/2017  . Morbid obesity due to excess calories ([place]) 11/16/2016  . Upper airway cough syndrome  11/15/2016  . DM (diabetes mellitus), type 2, uncontrolled, with renal complications ([place]) 71/24/5809  . Dyslipidemia associated with type 2 diabetes mellitus ([place]) 06/25/2016  . Uncontrolled type 2 diabetes mellitus with both eyes affected by proliferative retinopathy and macular edema, with long-term current use of insulin ([place]) 06/22/2016  . Uncontrolled type 2 diabetes mellitus with diabetic polyneuropathy, with long-term current use of insulin ([place]) 03/08/2016  . Proliferative diabetic retinopathy ([place]) 09/29/2015  . Constipation 09/03/2015  . Pseudophakia of both eyes 05/30/2015  . 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 B12 deficiency 05/10/2014  . Anemia associated with chronic renal failure, [place] 04/26/2014  . CKD (chronic kidney disease) stage 5, GFR less than 15 ml/min (HCC) 04/26/2014  . Secondary hyperparathyroidism ([place]) 11/27/2013  . Posterior subcapsular cataract, bilateral 10/18/2013  . Diabetic neuropathy, painful ([place]), on low dose Gabapentin 07/13/2013  . Diabetes mellitus, type II, insulin dependent ([place]), on CGM, with end organ damage: renal, neuropathy, gastroparesis, retinopathy 02/19/2011  . Hypertension associated with diabetes ([place]) 02/19/2011  . Gastroparesis due to DM ([place]) 02/19/2011   Social History   Tobacco Use  . Smoking status: Never Smoker  . Smokeless tobacco: Never Used  Substance Use Topics  . Alcohol use: No    Alcohol/week: 0.0 standard drinks  . Drug use: No   Current Medications and Allergies:   .  amLODipine (NORVASC) 5 MG tablet, Take 1 tablet (5 mg total) by mouth daily.,  Disp: 30 tablet, Rfl: 6 .  carvedilol (COREG) 6.25 MG tablet, Take 1 tablet (6.25 mg total) by mouth 2 (two) times daily with a meal., Disp: 60 tablet, Rfl: 6 .  gabapentin (NEURONTIN) 300 MG capsule, Take 1 capsule (300 mg total) by mouth 2 (two) times daily. (Patient taking differently: Take 300 mg by mouth 2 (two) times daily as needed (pain). ), Disp: 180  capsule, Rfl: 1 .  insulin aspart (NOVOLOG) 100 UNIT/ML injection, Inject 6-8 Units into the skin 3 (three) times daily before meals. 8 UNITS WITH BREAKFAST, 6 UNITS WITH LUNCH, & 8 UNITS WITH SUPPER, Disp: , Rfl:  .  insulin degludec (TRESIBA FLEXTOUCH) 100 UNIT/ML SOPN FlexTouch Pen, Inject 0.2 mLs (20 Units total) into the skin daily with lunch. (Patient taking differently: Inject 28 Units into the skin daily with lunch. ), Disp: 1 pen, Rfl: 3 .  simvastatin (ZOCOR) 10 MG tablet, Take 0.5 tablets (5 mg total) by mouth at bedtime. 1/2 po q hs - GFR 14, Disp: 45 tablet, Rfl: 1   Allergies  Allergen Reactions  . Ibuprofen Other (See Comments)    CKD stage 3. Should avoid.  . Dilaudid [Hydromorphone Hcl] Itching and Other (See Comments)    Can take with Benadryl.  . Tramadol Itching   Review of Systems   Pertinent items are noted in the HPI. Otherwise, a complete ROS is negative.  Vitals:   Vitals:   12/18/18 1439  BP: 132/74  Pulse: 86  Temp: 98.4 F (36.9 C)  TempSrc: Oral  SpO2: 95%  Weight: 219 lb 12.8 oz (99.7 kg)  Height: 5\' 8"  (1.727 m)     Body mass index is 33.42 kg/m.  Physical Exam:   Physical Exam Vitals signs and nursing note reviewed.  Constitutional:      General: [name] not in acute distress.    Appearance: Normal appearance.  HENT:     Head: Normocephalic and atraumatic.  Eyes:     Pupils: Pupils are equal, round, and reactive to light.  Neck:     Musculoskeletal: Normal range of motion and neck supple.  Cardiovascular:     Rate and Rhythm: Normal rate and regular rhythm.  Pulmonary:     Effort: Pulmonary effort is normal.  Abdominal:     Palpations: Abdomen is soft.  Musculoskeletal:       Back:     Comments: Point ttp, spasms, scoliosis, no skin changes or nodules or deformities, no CVA ttp, poor posture noted.  Skin:    General: Skin is warm.  Neurological:     General: No focal deficit present.     Mental Status: [name] alert and  oriented to person, place, and time.  Psychiatric:        Mood and Affect: Mood normal.        Behavior: Behavior normal.        Thought Content: Thought content normal.        Judgment: Judgment normal.    Dg Thoracic Spine 2 View  Result Date: 12/18/2018 CLINICAL DATA:  Right-sided back pain EXAM: THORACIC SPINE 2 VIEWS COMPARISON:  07/24/2018 FINDINGS: Focal scoliosis is noted centered in the upper lumbar spine stable from the previous exam. Dialysis catheter and loop recorder are noted. Cardiac shadow is stable. No compression deformities are seen. Mild osteophytic changes are noted. IMPRESSION: Chronic changes without acute abnormality. Electronically Signed   By: Inez Catalina M.D.   On: 12/18/2018 23:40   Dg Lumbar  Spine 2-3 Views  Result Date: 12/18/2018 CLINICAL DATA:  Low back pain on the right side EXAM: LUMBAR SPINE - 3 VIEW COMPARISON:  04/14/2017. FINDINGS: Vertebral body height is well maintained. No anterolisthesis is noted. Suggestion of pars defects at L5 are seen on the lateral projection. No gross soft tissue abnormality is noted. Peritoneal dialysis catheter is noted deep within the pelvis. IMPRESSION: No acute bony abnormality is noted. Suggestion of pars defects on the lateral film at L5. A pars defect was seen on prior CT examination. Electronically Signed   By: Inez Catalina M.D.   On: 12/18/2018 23:48   Assessment and Plan:   Palmina was seen today for pain.  Diagnoses and all orders for this visit:  Chronic right-sided thoracic back pain -     DG Thoracic Spine 2 View -     baclofen (LIORESAL) 10 MG tablet; Take 0.5 tablets (5 mg total) by mouth 2 (two) times daily as needed for muscle spasms. -     oxyCODONE (ROXICODONE) 5 MG immediate release tablet; Take 0.5 tablets (2.5 mg total) by mouth every 6 (six) hours as needed for severe pain.  Chronic right-sided low back pain without sciatica -     DG Lumbar Spine 2-3 Views -     baclofen (LIORESAL) 10 MG tablet;  Take 0.5 tablets (5 mg total) by mouth 2 (two) times daily as needed for muscle spasms. -     oxyCODONE (ROXICODONE) 5 MG immediate release tablet; Take 0.5 tablets (2.5 mg total) by mouth every 6 (six) hours as needed for severe pain.  Exam most c/w MSK etiology. No red flags. Discussed those with patient. Okay low dose meds as ordered. Offered PT.  . Orders and follow up as documented in [place], reviewed diet, exercise and weight control, cardiovascular risk and specific lipid/LDL goals reviewed, reviewed medications and side effects in detail.  . Reviewed expectations re: course of current medical issues. . Outlined signs and symptoms indicating need for more acute intervention. . Patient verbalized understanding and all questions were answered. . Patient received an After Visit Summary.  CMA served as Education administrator during this visit. History, Physical, and Plan performed by medical provider. The above documentation has been reviewed and is accurate and complete. Briscoe Deutscher, D.O.  Briscoe Deutscher, DO [place], [facility] 12/19/2018

## 2018-12-18 ENCOUNTER — Encounter: Payer: Self-pay | Admitting: Family Medicine

## 2018-12-18 ENCOUNTER — Ambulatory Visit (INDEPENDENT_AMBULATORY_CARE_PROVIDER_SITE_OTHER): Payer: Managed Care, Other (non HMO) | Admitting: Family Medicine

## 2018-12-18 ENCOUNTER — Ambulatory Visit (INDEPENDENT_AMBULATORY_CARE_PROVIDER_SITE_OTHER): Payer: Managed Care, Other (non HMO)

## 2018-12-18 VITALS — BP 132/74 | HR 86 | Temp 98.4°F | Ht 68.0 in | Wt 219.8 lb

## 2018-12-18 DIAGNOSIS — M545 Low back pain, unspecified: Secondary | ICD-10-CM

## 2018-12-18 DIAGNOSIS — M546 Pain in thoracic spine: Secondary | ICD-10-CM

## 2018-12-18 DIAGNOSIS — G8929 Other chronic pain: Secondary | ICD-10-CM

## 2018-12-18 MED ORDER — OXYCODONE HCL 5 MG PO TABS: 2.5000 mg | ORAL_TABLET | Freq: Four times a day (QID) | ORAL | 0 refills | Status: DC | PRN

## 2018-12-18 MED ORDER — BACLOFEN 10 MG PO TABS: 5.0000 mg | ORAL_TABLET | Freq: Two times a day (BID) | ORAL | 0 refills | Status: DC | PRN

## 2018-12-19 ENCOUNTER — Encounter: Payer: Self-pay | Admitting: Family Medicine

## 2018-12-25 LAB — CUP PACEART REMOTE DEVICE CHECK
Implantable Pulse Generator Implant Date: 20181210
MDC IDC SESS DTM: 20191203010800

## 2018-12-28 NOTE — Progress Notes (Signed)
Heather [name] is a 48 y.o. female here for an acute visit.  History of Present Illness:   [name], CMA acting as scribe for Dr. Briscoe Deutscher.  HPI: Pain with swallowing on left side. Started about two days ago. Pain is a 8/10 with swallowing. She does have tenderness when touching the area. She denies any fever but has had some cough with no congestion for about a week. No heartburn. No saliva changes. Hx of similar episodes last year that improved when she took NSAIDs.   She has had some nausea no vomiting that started this week. Patient started dialysis at home about a week ago. She states it has been going well no problems with that. [name] down two pounds at office today.   Lab Results  Component Value Date   HGBA1C 5.8 06/05/2018   PMHx, SurgHx, SocialHx, Medications, and Allergies were reviewed in the Visit Navigator and updated as appropriate.  Current Medications:   .  amLODipine (NORVASC) 5 MG tablet, Take 1 tablet (5 mg total) by mouth daily., Disp: 30 tablet, Rfl: 6 .  baclofen (LIORESAL) 10 MG tablet, Take 0.5 tablets (5 mg total) by mouth 2 (two) times daily as needed for muscle spasms., Disp: 30 each, Rfl: 0 .  carvedilol (COREG) 6.25 MG tablet, Take 1 tablet (6.25 mg total) by mouth 2 (two) times daily with a meal., Disp: 60 tablet, Rfl: 6 .  gabapentin (NEURONTIN) 300 MG capsule, Take 1 capsule (300 mg total) by mouth 2 (two) times daily. (Patient taking differently: Take 300 mg by mouth 2 (two) times daily as needed (pain). ), Disp: 180 capsule, Rfl: 1 .  insulin aspart (NOVOLOG) 100 UNIT/ML injection, Inject 6-8 Units into the skin 3 (three) times daily before meals. 8 UNITS WITH BREAKFAST, 6 UNITS WITH LUNCH, & 8 UNITS WITH SUPPER, Disp: , Rfl:  .  insulin degludec (TRESIBA FLEXTOUCH) 100 UNIT/ML SOPN FlexTouch Pen, Inject 0.2 mLs (20 Units total) into the skin daily with lunch. (Patient taking differently: Inject 28 Units into the skin daily with lunch.  ), Disp: 1 pen, Rfl: 3 .  lubiprostone (AMITIZA) 24 MCG capsule, Take 1 capsule (24 mcg total) by mouth 2 (two) times daily with a meal., Disp: 60 capsule, Rfl: 5 .  oxyCODONE (ROXICODONE) 5 MG immediate release tablet, Take 0.5 tablets (2.5 mg total) by mouth every 6 (six) hours as needed for severe pain., Disp: 20 tablet, Rfl: 0 .  simvastatin (ZOCOR) 10 MG tablet, Take 0.5 tablets (5 mg total) by mouth at bedtime. 1/2 po q hs - GFR 14, Disp: 45 tablet, Rfl: 1   Allergies  Allergen Reactions  . Ibuprofen Other (See Comments)    CKD stage 3. Should avoid.  . Dilaudid [Hydromorphone Hcl] Itching and Other (See Comments)    Can take with Benadryl.  . Tramadol Itching   Review of Systems:   Pertinent items are noted in the HPI. Otherwise, ROS is negative.  Vitals:   Vitals:   12/29/18 0847  BP: 118/68  Pulse: 88  Temp: 98.7 F (37.1 C)  TempSrc: Oral  SpO2: 95%  Weight: 218 lb (98.9 kg)  Height: 5\' 8"  (1.727 m)     Body mass index is 33.15 kg/m.  Physical Exam:   Physical Exam Vitals signs and nursing note reviewed.  Constitutional:      Appearance: [name] well-developed. [name] obese. [name] not toxic-appearing.  HENT:     Head: Normocephalic and atraumatic.  Salivary Glands: Right salivary gland is diffusely enlarged and tender.     Nose: No congestion or rhinorrhea.     Mouth/Throat:     Mouth: Mucous membranes are moist.     Tonsils: No tonsillar exudate or tonsillar abscesses.  Eyes:     Conjunctiva/sclera: Conjunctivae normal.     Pupils: Pupils are equal, round, and reactive to light.  Neck:     Musculoskeletal: Normal range of motion and neck supple.     Thyroid: No thyromegaly.  Cardiovascular:     Rate and Rhythm: Normal rate and regular rhythm.  Pulmonary:     Effort: Pulmonary effort is normal.     Breath sounds: No wheezing, rhonchi or rales.  Abdominal:     Palpations: Abdomen is soft.  Lymphadenopathy:     Cervical: No cervical adenopathy.    Skin:    General: Skin is warm.  Neurological:     Mental Status: [name] alert.  Psychiatric:        Behavior: Behavior normal.    Assessment and Plan:   Shonda was seen today for sore throat.  Diagnoses and all orders for this visit:  Swelling of left parotid gland Comments: Hx of the same. Never responded to Abx. Okay to see ENT if not improving. Okay to trial low dose prednisone.  Orders: -     predniSONE (DELTASONE) 5 MG tablet; 6-5-4-3-2-1-off  Peritoneal dialysis catheter in place [facility]) Comments: Followed closely by Nephrology and with appointment today.   Type 2 diabetes mellitus with end-stage renal disease ([place])  Difficulty walking -     Ambulatory referral to Home Health  Chronic right-sided low back pain without sciatica Comments: Improved since last visit.   Hemiplegia and hemiparesis following cerebral infarction affecting left non-dominant side (HCC)  Morbid (severe) obesity due to excess calories (HCC)  PVD (peripheral vascular disease) ([place])  Secondary hyperparathyroidism of renal origin ([place])  Diabetes mellitus due to underlying condition with both eyes affected by proliferative retinopathy without macular edema, with long-term current use of insulin ([place])   . Reviewed expectations re: course of current medical issues. . Discussed self-management of symptoms. . Outlined signs and symptoms indicating need for more acute intervention. . Patient verbalized understanding and all questions were answered. [name] Health Maintenance issues including appropriate healthy diet, exercise, and smoking avoidance were discussed with patient. . See orders for this visit as documented in the electronic medical record. . Patient received an After Visit Summary.  CMA served as Education administrator during this visit. History, Physical, and Plan performed by medical provider. The above documentation has been reviewed and is accurate and complete. Briscoe Deutscher, D.O.  Briscoe Deutscher,  DO [place], [facility] 12/29/2018

## 2018-12-29 ENCOUNTER — Ambulatory Visit (INDEPENDENT_AMBULATORY_CARE_PROVIDER_SITE_OTHER): Payer: Managed Care, Other (non HMO) | Admitting: Family Medicine

## 2018-12-29 ENCOUNTER — Encounter: Payer: Self-pay | Admitting: Family Medicine

## 2018-12-29 VITALS — BP 118/68 | HR 88 | Temp 98.7°F | Ht 68.0 in | Wt 218.0 lb

## 2018-12-29 DIAGNOSIS — R262 Difficulty in walking, not elsewhere classified: Secondary | ICD-10-CM

## 2018-12-29 DIAGNOSIS — M545 Low back pain, unspecified: Secondary | ICD-10-CM

## 2018-12-29 DIAGNOSIS — E083593 Diabetes mellitus due to underlying condition with proliferative diabetic retinopathy without macular edema, bilateral: Secondary | ICD-10-CM

## 2018-12-29 DIAGNOSIS — N2581 Secondary hyperparathyroidism of renal origin: Secondary | ICD-10-CM

## 2018-12-29 DIAGNOSIS — R600 Localized edema: Secondary | ICD-10-CM

## 2018-12-29 DIAGNOSIS — N186 End stage renal disease: Secondary | ICD-10-CM

## 2018-12-29 DIAGNOSIS — R609 Edema, unspecified: Secondary | ICD-10-CM | POA: Diagnosis not present

## 2018-12-29 DIAGNOSIS — Z992 Dependence on renal dialysis: Secondary | ICD-10-CM | POA: Diagnosis not present

## 2018-12-29 DIAGNOSIS — I739 Peripheral vascular disease, unspecified: Secondary | ICD-10-CM

## 2018-12-29 DIAGNOSIS — I69354 Hemiplegia and hemiparesis following cerebral infarction affecting left non-dominant side: Secondary | ICD-10-CM

## 2018-12-29 DIAGNOSIS — G8929 Other chronic pain: Secondary | ICD-10-CM

## 2018-12-29 DIAGNOSIS — Z794 Long term (current) use of insulin: Secondary | ICD-10-CM

## 2018-12-29 DIAGNOSIS — E1122 Type 2 diabetes mellitus with diabetic chronic kidney disease: Secondary | ICD-10-CM

## 2018-12-29 MED ORDER — PREDNISONE 5 MG PO TABS: ORAL_TABLET | ORAL | 0 refills | Status: DC

## 2019-01-01 ENCOUNTER — Ambulatory Visit (INDEPENDENT_AMBULATORY_CARE_PROVIDER_SITE_OTHER): Payer: Managed Care, Other (non HMO)

## 2019-01-01 ENCOUNTER — Telehealth: Payer: Self-pay | Admitting: Family Medicine

## 2019-01-01 DIAGNOSIS — I639 Cerebral infarction, unspecified: Secondary | ICD-10-CM

## 2019-01-01 NOTE — Telephone Encounter (Signed)
Ok to do letter

## 2019-01-01 NOTE — Telephone Encounter (Signed)
See note  Copied from [place] [id number]. Topic: General - Inquiry >> Jan 01, 2019  2:08 PM Vernona Rieger wrote: Reason for CRM: Patient is calling to see if she can have another note for her job stating " It is my medical opinion that due to ongoing back pain it would be beneficial for you to have a multi-positional desk for work. " just like the note that Dr Juleen China gave her last year on January 7th. Please Advise.

## 2019-01-02 LAB — CUP PACEART REMOTE DEVICE CHECK
Date Time Interrogation Session: 20200105013519
Implantable Pulse Generator Implant Date: 20181210

## 2019-01-02 NOTE — Progress Notes (Signed)
[facility] Summary Report / Loop Recorder 

## 2019-01-03 NOTE — Telephone Encounter (Signed)
Called patient let her know that letter ready she requested that we fax to [phone].

## 2019-01-03 NOTE — Telephone Encounter (Signed)
Absolutely okay.

## 2019-01-03 NOTE — Telephone Encounter (Signed)
Patient calling to follow up. Thank you

## 2019-01-03 NOTE — Telephone Encounter (Signed)
See note

## 2019-01-12 ENCOUNTER — Other Ambulatory Visit: Payer: Self-pay | Admitting: Family Medicine

## 2019-01-12 DIAGNOSIS — G8929 Other chronic pain: Secondary | ICD-10-CM

## 2019-01-12 DIAGNOSIS — M545 Low back pain: Secondary | ICD-10-CM

## 2019-01-12 DIAGNOSIS — M546 Pain in thoracic spine: Principal | ICD-10-CM

## 2019-01-26 ENCOUNTER — Other Ambulatory Visit: Payer: Self-pay | Admitting: Family Medicine

## 2019-01-26 DIAGNOSIS — G8929 Other chronic pain: Secondary | ICD-10-CM

## 2019-01-26 DIAGNOSIS — M546 Pain in thoracic spine: Principal | ICD-10-CM

## 2019-01-26 DIAGNOSIS — M545 Low back pain: Secondary | ICD-10-CM

## 2019-01-26 MED ORDER — BACLOFEN 10 MG PO TABS: ORAL_TABLET | ORAL | 1 refills | Status: DC

## 2019-01-26 NOTE — Addendum Note (Signed)
Addended by: Marian Sorrow on: 01/26/2019 03:06 PM   Modules accepted: Orders

## 2019-02-03 ENCOUNTER — Emergency Department (HOSPITAL_BASED_OUTPATIENT_CLINIC_OR_DEPARTMENT_OTHER)
Admission: EM | Admit: 2019-02-03 | Discharge: 2019-02-04 | Disposition: A | Discharge status: Home / Self Care | Payer: Managed Care, Other (non HMO) | Source: Home / Self Care | Attending: Emergency Medicine | Admitting: Emergency Medicine

## 2019-02-03 ENCOUNTER — Emergency Department (HOSPITAL_BASED_OUTPATIENT_CLINIC_OR_DEPARTMENT_OTHER): Payer: Managed Care, Other (non HMO)

## 2019-02-03 ENCOUNTER — Other Ambulatory Visit: Payer: Self-pay

## 2019-02-03 ENCOUNTER — Encounter (HOSPITAL_BASED_OUTPATIENT_CLINIC_OR_DEPARTMENT_OTHER): Payer: Self-pay | Admitting: Emergency Medicine

## 2019-02-03 DIAGNOSIS — N186 End stage renal disease: Secondary | ICD-10-CM | POA: Insufficient documentation

## 2019-02-03 DIAGNOSIS — K659 Peritonitis, unspecified: Secondary | ICD-10-CM | POA: Insufficient documentation

## 2019-02-03 DIAGNOSIS — I120 Hypertensive chronic kidney disease with stage 5 chronic kidney disease or end stage renal disease: Secondary | ICD-10-CM | POA: Insufficient documentation

## 2019-02-03 DIAGNOSIS — B9689 Other specified bacterial agents as the cause of diseases classified elsewhere: Secondary | ICD-10-CM

## 2019-02-03 DIAGNOSIS — N760 Acute vaginitis: Secondary | ICD-10-CM

## 2019-02-03 DIAGNOSIS — E1122 Type 2 diabetes mellitus with diabetic chronic kidney disease: Secondary | ICD-10-CM | POA: Insufficient documentation

## 2019-02-03 LAB — BODY FLUID CELL COUNT WITH DIFFERENTIAL
Eos, Fluid: 1 %
Lymphs, Fluid: 4 %
Monocyte-Macrophage-Serous Fluid: 14 % — ABNORMAL LOW (ref 50–90)
NEUTROPHIL FLUID: 81 % — AB (ref 0–25)
Total Nucleated Cell Count, Fluid: 210 cu mm (ref 0–1000)

## 2019-02-03 LAB — WET PREP, GENITAL
Sperm: NONE SEEN
Trich, Wet Prep: NONE SEEN
WBC, Wet Prep HPF POC: NONE SEEN
Yeast Wet Prep HPF POC: NONE SEEN

## 2019-02-03 LAB — RENAL FUNCTION PANEL
Albumin: 2.9 g/dL — ABNORMAL LOW (ref 3.5–5.0)
Anion gap: 14 (ref 5–15)
BUN: 62 mg/dL — ABNORMAL HIGH (ref 6–20)
CO2: 21 mmol/L — ABNORMAL LOW (ref 22–32)
Calcium: 7 mg/dL — ABNORMAL LOW (ref 8.9–10.3)
Chloride: 95 mmol/L — ABNORMAL LOW (ref 98–111)
Creatinine, Ser: 10.1 mg/dL — ABNORMAL HIGH (ref 0.44–1.00)
GFR calc Af Amer: 5 mL/min — ABNORMAL LOW (ref >60)
GFR calc non Af Amer: 4 mL/min — ABNORMAL LOW (ref >60)
Glucose, Bld: 257 mg/dL — ABNORMAL HIGH (ref 70–99)
Phosphorus: 5.1 mg/dL — ABNORMAL HIGH (ref 2.5–4.6)
Potassium: 3.7 mmol/L (ref 3.5–5.1)
Sodium: 130 mmol/L — ABNORMAL LOW (ref 135–145)

## 2019-02-03 LAB — CBC
HEMATOCRIT: 30 % — AB (ref 36.0–46.0)
Hemoglobin: 9.2 g/dL — ABNORMAL LOW (ref 12.0–15.0)
MCH: 29.1 pg (ref 26.0–34.0)
MCHC: 30.7 g/dL (ref 30.0–36.0)
MCV: 94.9 fL (ref 80.0–100.0)
Platelets: 273 10*3/uL (ref 150–400)
RBC: 3.16 MIL/uL — ABNORMAL LOW (ref 3.87–5.11)
RDW: 14.1 % (ref 11.5–15.5)
WBC: 11.3 10*3/uL — ABNORMAL HIGH (ref 4.0–10.5)
nRBC: 0 % (ref 0.0–0.2)

## 2019-02-03 LAB — HCG, QUANTITATIVE, PREGNANCY: hCG, Beta Chain, Quant, S: 1 m[IU]/mL (ref <5)

## 2019-02-03 LAB — LIPASE, BLOOD: Lipase: 59 U/L — ABNORMAL HIGH (ref 11–51)

## 2019-02-03 MED ORDER — FENTANYL CITRATE (PF) 100 MCG/2ML IJ SOLN
25.0000 ug | Freq: Once | INTRAMUSCULAR | Status: AC
  Administered 2019-02-03: 25 ug via INTRAVENOUS
  Filled 2019-02-03: qty 2

## 2019-02-03 MED ORDER — VANCOMYCIN HCL 10 G IV SOLR
2000.0000 mg | Freq: Once | INTRAVENOUS | Status: AC
  Administered 2019-02-03: 2000 mg via INTRAVENOUS
  Filled 2019-02-03: qty 2000

## 2019-02-03 MED ORDER — METRONIDAZOLE 500 MG PO TABS: 500.0000 mg | ORAL_TABLET | Freq: Two times a day (BID) | ORAL | 0 refills | Status: DC

## 2019-02-03 MED ORDER — CEFEPIME HCL 2 G IJ SOLR
INTRAMUSCULAR | Status: AC
  Filled 2019-02-03: qty 2

## 2019-02-03 MED ORDER — PROMETHAZINE HCL 25 MG PO TABS
12.5000 mg | ORAL_TABLET | Freq: Once | ORAL | Status: AC
  Administered 2019-02-03: 12.5 mg via ORAL
  Filled 2019-02-03: qty 1

## 2019-02-03 MED ORDER — SODIUM CHLORIDE 0.9 % IV SOLN
2.0000 g | Freq: Once | INTRAVENOUS | Status: AC
  Administered 2019-02-03: 2 g via INTRAVENOUS
  Filled 2019-02-03: qty 2

## 2019-02-03 MED ORDER — FENTANYL CITRATE (PF) 100 MCG/2ML IJ SOLN
12.5000 ug | Freq: Once | INTRAMUSCULAR | Status: AC
  Administered 2019-02-03: 12.5 ug via INTRAVENOUS
  Filled 2019-02-03: qty 2

## 2019-02-03 MED ORDER — VANCOMYCIN HCL 1000 MG IV SOLR
INTRAVENOUS | Status: AC
  Filled 2019-02-03: qty 2000

## 2019-02-03 MED ORDER — SODIUM CHLORIDE 0.9 % IV SOLN
INTRAVENOUS | Status: DC | PRN
  Administered 2019-02-03: 23:00:00 via INTRAVENOUS

## 2019-02-03 NOTE — ED Notes (Signed)
ED Provider at bedside. 

## 2019-02-03 NOTE — ED Notes (Signed)
Patient transported to CT 

## 2019-02-03 NOTE — ED Notes (Signed)
RN attempted to start IV without success

## 2019-02-03 NOTE — Progress Notes (Signed)
Waiting on preg test results for CT

## 2019-02-03 NOTE — ED Provider Notes (Signed)
[facility]ERG[facility]Y DEPARTMENT Provider Note   CSN: [id number] Arrival date & time: 02/03/19  1801     History   Chief Complaint Chief Complaint  Patient presents with  . Pelvic Pain    HPI Heather [name] is a 48 y.o. female.  Started having pelvic pain "below my belly" for the last 3 days. Has had vomiting since Thursday which has since improved and is nauseous today but has not vomited. Does not have much of an appetite. No fever/chills.  Went to center on Thursday and she was told that site looked infected but she has not noticed and redness or drainage around her site. Did her peritoneal dialysis today which was much more painful than usual today and she was concerned that the fluid might be slightly cloudy. Does have some diarrhea that has been ongoing for the past week. Also notes some vaginal discharge. She does still make some urine, goes a small amount about once per day. Denies dysuria.     Past Medical History:  Diagnosis Date  . Antiplatelet or antithrombotic long-term use: Plavix 06/30/2017  . B12 deficiency 05/10/2014  . Blood transfusion without reported diagnosis   . Chronic constipation   . CVA (cerebral vascular accident) ([place]), nonhemorrhagic, inferior right cerebellum 12/03/2017  . Cyst of left ovary 01/12/2018  . Diabetic neuropathy, painful ([place]), on low dose Gabapentin 07/13/2013  . DM (diabetes mellitus), type 2, uncontrolled, with renal complications ([place]) 03/50/0938  . DM2 (diabetes mellitus, type 2) ([place])   . Dysfunction of left eustachian tube, with pusatile tinnitus 11/24/2017  . Dyslipidemia associated with type 2 diabetes mellitus ([place]) 06/25/2016  . ESRD (end stage renal disease) on dialysis ([place])   . ESRD with anemia ([place])   . Fibromyalgia   . Gastroparesis due to DM   . GERD (gastroesophageal reflux disease)   . Hypertension associated with diabetes ([place]) 02/19/2011  . IBS (irritable bowel syndrome)   . Left-sided weakness  12/10/2017   .  [name] Leiomyoma of uterus   . Pancreatitis   . Proliferative diabetic retinopathy ([place]) 09/29/2015  . 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 Reactive depression 12/10/2017   .  [name] Right-sided low back pain without sciatica 12/08/2015  . Secondary hyperthyroidism 11/27/2013  . Steal syndrome dialysis vascular access ([place]) 02/17/2018  . Thromboembolism ([place]) 04/05/2018  . Upper airway cough syndrome, with recs to stay off ACE and take Pepcid q hs 11/15/2016    Patient Active Problem List   Diagnosis Date Noted  . Hemiplegia and hemiparesis following cerebral infarction affecting left non-dominant side ([place]) 12/29/2018  . Peritoneal dialysis catheter in place [facility]) 11/14/2018  . Pre-transplant evaluation for kidney transplant 11/07/2018  . ESRD with anemia ([place])   . ESRD (end stage renal disease) on dialysis ([place])   . Grade I diastolic dysfunction 18/29/9371  . Arteriovenous fistula ([place]) 07/21/2018  . Type 2 diabetes mellitus with end-stage renal disease ([place]) 07/21/2018  . History of cerebrovascular accident (CVA) involving cerebellum 07/21/2018  . Cyst of left ovary 01/12/2018  . Dysmenorrhea 01/12/2018  . Insomnia 01/12/2018  . Menorrhagia 01/12/2018  . Vitamin D deficiency 01/02/2018  . Bilateral lower extremity edema 01/02/2018  . Reactive depression 12/10/2017  . Arthralgia of right temporomandibular joint 10/24/2017  . Antiplatelet or antithrombotic long-term use: Plavix 06/30/2017  . Morbid obesity due to excess calories ([place]) 11/16/2016  . Upper airway cough syndrome 11/15/2016  . DM (diabetes mellitus), type 2, uncontrolled, with renal complications ([place])  10/12/2016  . Dyslipidemia associated with type 2 diabetes mellitus ([place]) 06/25/2016  . Uncontrolled type 2 diabetes mellitus with both eyes affected by proliferative retinopathy and macular edema, with long-term current use of insulin ([place]) 06/22/2016  . Uncontrolled type 2 diabetes mellitus with  diabetic polyneuropathy, with long-term current use of insulin ([place]) 03/08/2016  . Proliferative diabetic retinopathy ([place]) 09/29/2015  . Constipation 09/03/2015  . Pseudophakia of both eyes 05/30/2015  . 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 B12 deficiency 05/10/2014  . Anemia associated with chronic renal failure, [place] 04/26/2014  . CKD (chronic kidney disease) stage 5, GFR less than 15 ml/min (HCC) 04/26/2014  . Secondary hyperparathyroidism ([place]) 11/27/2013  . Posterior subcapsular cataract, bilateral 10/18/2013  . Diabetic neuropathy, painful ([place]), on low dose Gabapentin 07/13/2013  . Diabetes mellitus, type II, insulin dependent ([place]), on CGM, with end organ damage: renal, neuropathy, gastroparesis, retinopathy 02/19/2011  . Hypertension associated with diabetes ([place]) 02/19/2011  . Gastroparesis due to DM ([place]) 02/19/2011    Past Surgical History:  Procedure Laterality Date  . ARTERY REPAIR Right 02/17/2018   Procedure: EXPLORATION OF RIGHT BRACHIAL ARTERY;  Surgeon: Conrad Elkton, MD;  Location: [facility];  Service: Vascular;  Laterality: Right;  . ARTERY REPAIR Right 02/17/2018   Procedure: BRACHIAL ARTERY EXPLORATION AND TRHOMBECTOMY;  Surgeon: Conrad Mineral, MD;  Location[facility];  Service: Vascular;  Laterality: Right;  . AV FISTULA PLACEMENT Left 05/09/2017   Procedure: INSERTION OF ARTERIOVENOUS (AV) GRAFT ARM (ARTEGRAFT);  Surgeon: Conrad Plessis, MD;  [facility];  Service: Vascular;  Laterality: Left;  . AV FISTULA PLACEMENT Right 02/17/2018   Procedure: INSERTION OF ARTERIOVENOUS (AV) GORE-TEX GRAFT ARM RIGHT UPPER ARM;  Surgeon: Conrad Elgin, MD;  Location: [facility];  Service: Vascular;  Laterality: Right;  . BASCILIC VEIN TRANSPOSITION Left 08/31/2016   Procedure: BASILIC VEIN TRANSPOSITION FIRST STAGE;  Surgeon: Conrad Maricao, MD;  Location: [facility];  Service: Vascular;  Laterality: Left;  . CENTRAL VENOUS CATHETER INSERTION Left 07/24/2018   Procedure: INSERTION  CENTRAL LINE ADULT;  Surgeon: Conrad Gridley, MD;  Location: [facility];  Service: Vascular;  Laterality: Left;  . EYE SURGERY     secondary to diabetic retinopathy   . FOOT SURGERY Right    t"ook bone out- maybe hammer toe"  . INSERTION OF DIALYSIS CATHETER Left 07/24/2018   Procedure: INSERTION OF TUNNELED DIALYSIS CATHETER;  Surgeon: Conrad Skamania, MD;  Location: [facility];  Service: Vascular;  Laterality: Left;  . IR FLUORO GUIDE CV LINE RIGHT  07/21/2018  . IR US GUIDE VASC ACCESS RIGHT  07/21/2018  . LIGATION OF ARTERIOVENOUS  FISTULA Left 05/09/2017   Procedure: LIGATION OF ARTERIOVENOUS  FISTULA;  Surgeon: Conrad Ridgeley, MD;  Location: [facility];  Service: Vascular;  Laterality: Left;  . LOOP RECORDER INSERTION N/A 12/05/2017   Procedure: LOOP RECORDER INSERTION;  Surgeon: Evans Lance, MD;  Location: [facility] CV LAB;  Service: Cardiovascular;  Laterality: N/A;  . MYOMECTOMY    . REMOVAL OF GRAFT Right 02/17/2018   Procedure: REMOVAL OF RIGHT UPPER ARM ARTERIOVENOUS GRAFT;  Surgeon: Conrad Shamrock Lakes, MD;  Location: [facility];  Service: Vascular;  Laterality: Right;  . REVISION OF ARTERIOVENOUS GORETEX GRAFT Left 07/24/2018   Procedure: REDO ARTERIOVENOUS GORETEX GRAFT;  Surgeon: Conrad , MD;  Location: [facility];  Service: Vascular;  Laterality: Left;  . TEE WITHOUT CARDIOVERSION N/A 12/05/2017   Procedure: TRANSESOPHAGEAL ECHOCARDIOGRAM (TEE);  Surgeon: Sanda Klein, MD;  Location: [facility] ENDOSCOPY;  Service: Cardiovascular;  Laterality: N/A;  . UPPER EXTREMITY VENOGRAPHY Left 07/13/2018   Procedure: UPPER EXTREMITY VENOGRAPHY - Central & Left Arm;  Surgeon: Conrad Mount Morris, MD;  Location: [facility] CV LAB;  Service: Cardiovascular;  Laterality: Left;  . UTERINE FIBROID SURGERY    . VITRECTOMY Bilateral      OB History    Gravida  2   Para  1   Term      Preterm      AB  1   Living  1     SAB  1   TAB      Ectopic      Multiple      Live Births               Home  Medications    Prior to Admission medications   Medication Sig Start Date End Date Taking? Authorizing Provider  amLODipine (NORVASC) 5 MG tablet Take 1 tablet (5 mg total) by mouth daily. 08/09/18   Briscoe Deutscher, DO  baclofen (LIORESAL) 10 MG tablet TAKE 1/2 TABLET BY MOUTH 2 (TWO) TIMES DAILY AS NEEDED FOR MUSCLE SPASMS. 01/26/19   Briscoe Deutscher, DO  carvedilol (COREG) 6.25 MG tablet Take 1 tablet (6.25 mg total) by mouth 2 (two) times daily with a meal. 08/09/18   Briscoe Deutscher, DO  gabapentin (NEURONTIN) 300 MG capsule Take 1 capsule (300 mg total) by mouth 2 (two) times daily. Patient taking differently: Take 300 mg by mouth 2 (two) times daily as needed (pain).  04/11/18   Briscoe Deutscher, DO  insulin aspart (NOVOLOG) 100 UNIT/ML injection Inject 6-8 Units into the skin 3 (three) times daily before meals. 8 UNITS WITH BREAKFAST, 6 UNITS WITH LUNCH, & 8 UNITS WITH SUPPER    [provider]  insulin degludec (TRESIBA FLEXTOUCH) 100 UNIT/ML SOPN FlexTouch Pen Inject 0.2 mLs (20 Units total) into the skin daily with lunch. Patient taking differently: Inject 28 Units into the skin daily with lunch.  07/26/18   Danford, Suann Larry, MD  lubiprostone (AMITIZA) 24 MCG capsule Take 1 capsule (24 mcg total) by mouth 2 (two) times daily with a meal. 12/05/18   Zehr, Janett Billow D, PA-C  oxyCODONE (ROXICODONE) 5 MG immediate release tablet Take 0.5 tablets (2.5 mg total) by mouth every 6 (six) hours as needed for severe pain. 12/18/18   Briscoe Deutscher, DO  predniSONE (DELTASONE) 5 MG tablet 6-5-4-3-2-1-off 12/29/18   Briscoe Deutscher, DO  simvastatin (ZOCOR) 10 MG tablet Take 0.5 tablets (5 mg total) by mouth at bedtime. 1/2 po q hs - GFR 14 04/11/18   Briscoe Deutscher, DO    Family History Family History  Problem Relation Age of Onset  . Colon polyps Mother   . Diabetes Mother   . Heart murmur Father        history essentially unknown  . Hypertension Father   . Diabetes Sister   . Kidney failure  Sister        kidney transplant  . Diabetes Brother   . Retinal degeneration Brother   . Heart murmur Son   . Stroke Paternal Grandmother   . Diabetes Sister     Social History Social History   Tobacco Use  . Smoking status: Never Smoker  . Smokeless tobacco: Never Used  Substance Use Topics  . Alcohol use: No    Alcohol/week: 0.0 standard drinks  . Drug use: No  Allergies   Ibuprofen; Dilaudid [hydromorphone hcl]; and Tramadol   Review of Systems Review of Systems  Constitutional: Positive for appetite change. Negative for chills and fever.  HENT: Negative for congestion, rhinorrhea and sore throat.   Respiratory: Negative for shortness of breath.   Cardiovascular: Negative for chest pain and leg swelling.  Gastrointestinal: Positive for diarrhea and nausea. Negative for abdominal pain, blood in stool, constipation and vomiting.  Genitourinary: Positive for pelvic pain and vaginal discharge. Negative for decreased urine volume, dysuria, frequency, hematuria, urgency, vaginal bleeding and vaginal pain.  Musculoskeletal: Negative for arthralgias.  Skin: Negative for color change, rash and wound.  Neurological: Negative for headaches.     Physical Exam Updated Vital Signs BP (!) 155/85 (BP Location: Right Arm)   Pulse 95   Temp 98.5 F (36.9 C) (Oral)   Resp 18   Ht 5\' 9"  (1.753 m)   Wt 99.8 kg   LMP 12/13/2018   SpO2 100%   BMI 32.49 kg/m   Physical Exam Constitutional:      General: [name] not in acute distress.    Appearance: Normal appearance. [name] not ill-appearing, toxic-appearing or diaphoretic.  HENT:     Head: Normocephalic and atraumatic.     Nose: Nose normal.     Mouth/Throat:     Mouth: Mucous membranes are moist.     Pharynx: Oropharynx is clear. No oropharyngeal exudate or posterior oropharyngeal erythema.  Eyes:     Extraocular Movements: Extraocular movements intact.     Conjunctiva/sclera: Conjunctivae normal.  Neck:      Musculoskeletal: Normal range of motion.  Cardiovascular:     Rate and Rhythm: Normal rate and regular rhythm.     Heart sounds: No murmur.  Pulmonary:     Effort: Pulmonary effort is normal.     Breath sounds: Normal breath sounds.  Abdominal:     General: Bowel sounds are normal. There is distension.     Tenderness: There is abdominal tenderness. There is rebound. There is no guarding.  Genitourinary:    General: Normal vulva.     Vagina: Vaginal discharge (thick nonclumpy whitish discharge) present.     Comments: Cervix without lesions but does have cervical motion tenderness. Musculoskeletal: Normal range of motion.     Right lower leg: No edema.     Left lower leg: No edema.  Skin:    General: Skin is warm and dry.     Comments: Peritoneal dialysis site without erythema or purlence, no TTP  Neurological:     General: No focal deficit present.     Mental Status: [name] alert and oriented to person, place, and time.  Psychiatric:        Mood and Affect: Mood normal.      ED Treatments / Results  Labs (all labs ordered are listed, but only abnormal results are displayed) Labs Reviewed  WET PREP, GENITAL - Abnormal; Notable for the following components:      Result Value   Clue Cells Wet Prep HPF POC PRESENT (*)    All other components within normal limits  CBC - Abnormal; Notable for the following components:   WBC 11.3 (*)    RBC 3.16 (*)    Hemoglobin 9.2 (*)    HCT 30.0 (*)    All other components within normal limits  RENAL FUNCTION PANEL - Abnormal; Notable for the following components:   Sodium 130 (*)    Chloride 95 (*)    CO2  21 (*)    Glucose, Bld 257 (*)    BUN 62 (*)    Creatinine, Ser 10.10 (*)    Calcium 7.0 (*)    Phosphorus 5.1 (*)    Albumin 2.9 (*)    GFR calc non Af Amer 4 (*)    GFR calc Af Amer 5 (*)    All other components within normal limits  LIPASE, BLOOD - Abnormal; Notable for the following components:   Lipase 59 (*)    All other  components within normal limits  BODY FLUID CULTURE  CULTURE, BLOOD (ROUTINE X 2)  CULTURE, BLOOD (ROUTINE X 2)  HCG, QUANTITATIVE, PREGNANCY  BODY FLUID CELL COUNT WITH DIFFERENTIAL  URINALYSIS, ROUTINE W REFLEX MICROSCOPIC  GC/CHLAMYDIA PROBE AMP ([place]) NOT AT [facility]    EKG None  Radiology Ct Abdomen Pelvis Wo Contrast  Result Date: 02/03/2019 CLINICAL DATA:  Abdominal and LEFT pelvic pain for 3 days with nausea and vomiting question diverticulitis; history of pancreatitis, GERD, at type II diabetes mellitus, hypertension, stroke, end-stage renal disease on dialysis EXAM: CT ABDOMEN AND PELVIS WITHOUT CONTRAST TECHNIQUE: Multidetector CT imaging of the abdomen and pelvis was performed following the standard protocol without IV contrast. Sagittal and coronal MPR images reconstructed from axial data set. Patient drank dilute oral contrast for exam. COMPARISON:  04/14/2017 FINDINGS: Lower chest: Lung bases clear Hepatobiliary: Contracted gallbladder. No focal hepatic abnormalities. Pancreas: Normal appearance Spleen: Normal appearance Adrenals/Urinary Tract: Adrenal glands normal appearance. Kidneys small with tiny nonobstructing calculi in LEFT kidney. No renal mass, hydronephrosis or ureteral dilatation. Bladder unremarkable. Stomach/Bowel: Normal appendix. Stomach unremarkable. Questionable wall thickening of the transverse colon versus artifact from underdistention. No definite small bowel abnormalities are identified. An area of infiltration of intra-abdominal fat in anterior LEFT mid abdomen is identified, nonspecific, approximately 4.5 x 1.5 x 3.7 cm in size, without definite wall thickening or abnormalities of the closest small bowel loops, of uncertain significance. Vascular/Lymphatic: Atherosclerotic calcifications aorta and iliac arteries without aneurysm. Small pericardial effusion is present. No adenopathy. Scattered pelvic phleboliths. Reproductive: Unremarkable uterus and ovaries.  Other: Peritoneal dialysis catheter in pelvis. Minimal scattered peritoneal fluid. Tiny foci of intraperitoneal free air are identified, nonspecific, may be related to peritoneal dialysis. No definite hernia or additional inflammatory changes. Musculoskeletal: No acute osseous findings. IMPRESSION: Focus of nonspecific infiltration in the intraperitoneal fat of the anterior LEFT mid abdomen, area approximately 4.5 x 1.5 x 3.7 cm, could potentially be related to infiltration from peritoneal dialysate, focal nonspecific inflammatory process not excluded. Questionable mild wall thickening of the transverse colon versus artifact from underdistention, cannot exclude colitis. Small pericardial effusion. Foci of nonspecific free air, which could be related to peritoneal dialysis. Electronically Signed   By: Lavonia Dana M.D.   On: 02/03/2019 22:19    Procedures Procedures (including critical care time)  Medications Ordered in ED Medications  vancomycin (VANCOCIN) 2,000 mg in sodium chloride 0.9 % 500 mL IVPB (has no administration in time range)  ceFEPIme (MAXIPIME) 2 g in sodium chloride 0.9 % 100 mL IVPB (2 g Intravenous New Bag/Given 02/03/19 2319)  ceFEPIme (MAXIPIME) 2 g injection (has no administration in time range)  vancomycin (VANCOCIN) 1000 MG powder (has no administration in time range)  0.9 %  sodium chloride infusion ( Intravenous New Bag/Given 02/03/19 2318)  promethazine (PHENERGAN) tablet 12.5 mg (12.5 mg Oral Given 02/03/19 2027)  fentaNYL (SUBLIMAZE) injection 12.5 mcg (12.5 mcg Intravenous Given 02/03/19 2028)  fentaNYL (SUBLIMAZE) injection 25 mcg (25  mcg Intravenous Given 02/03/19 2116)     Initial Impression / Assessment and Plan / ED Course  I have reviewed the triage vital signs and the nursing notes.  Pertinent labs & imaging results that were available during my care of the patient were reviewed by me and considered in my medical decision making (see chart for details).   Patient has  rebound and guarding over her L lower pelvic region. She was found to have clue cells on wet prep c/w bacterial vaginosis which could explain her symptoms. Will treat with metronidazole. However she is exquisitely TTP in L lower quadrant and has significant pain even with slight movements and is a peritoneal dialysis patient. [name] afebrile and nontoxic with stable vitals which is reassuring but does have leukocytosis. CT abd showing nonspecific inflammatory process in intraperitoneal fat. Also notable for mild wall thickening of transverse colon. Discussed with Dr. Grayland Ormond nephrology who recommended treating for peritonitis. Given loading dose of vancomycin and cefepime. Patient's at home on call dialysis nurse was contacted who will set her up for outpatient treatment of peritonitis on Monday.    Final Clinical Impressions(s) / ED Diagnoses   Final diagnoses:  Peritonitis ([place])  BV (bacterial vaginosis)    ED Discharge Orders         Ordered    metroNIDAZOLE (FLAGYL) 500 MG tablet  2 times daily     02/03/19 Clarke, DO PGY-3, [facility] 02/03/2019 11:24 PM     Bufford Lope, DO 02/03/19 2354    Deno Etienne, DO 02/04/19 0002    Deno Etienne, DO 02/20/19 2316

## 2019-02-03 NOTE — Discharge Instructions (Signed)
Your on call dialysis nurse will set you up for antibiotics with peritoneal dialysis on Monday to treat peritonitis.  Please take metronidazole twice a day for 7 days to treat your bacteral vaginosis

## 2019-02-03 NOTE — ED Triage Notes (Signed)
Patient states that she is having pain to her left pelvic region for the last 3 days - the patient is on peritoneal dialysis and brought the fluid with her. Reports vomiting on Thursday and then nausea today

## 2019-02-03 NOTE — ED Notes (Signed)
Unable to obtain second set of blood culture 

## 2019-02-04 NOTE — ED Notes (Signed)
Pt understood dc material. NAD noted. Script given at [employer]. All questions answered to satisfaction.

## 2019-02-05 ENCOUNTER — Other Ambulatory Visit: Payer: Self-pay

## 2019-02-05 ENCOUNTER — Inpatient Hospital Stay (HOSPITAL_COMMUNITY)
Admission: EM | Admit: 2019-02-05 | Discharge: 2019-02-13 | DRG: 919 | Disposition: A | Discharge status: Home / Self Care | Payer: Managed Care, Other (non HMO) | Attending: Internal Medicine | Admitting: Internal Medicine

## 2019-02-05 ENCOUNTER — Emergency Department (HOSPITAL_COMMUNITY): Payer: Managed Care, Other (non HMO)

## 2019-02-05 ENCOUNTER — Encounter (HOSPITAL_COMMUNITY): Payer: Self-pay

## 2019-02-05 DIAGNOSIS — I152 Hypertension secondary to endocrine disorders: Secondary | ICD-10-CM | POA: Diagnosis present

## 2019-02-05 DIAGNOSIS — R092 Respiratory arrest: Secondary | ICD-10-CM | POA: Diagnosis not present

## 2019-02-05 DIAGNOSIS — K219 Gastro-esophageal reflux disease without esophagitis: Secondary | ICD-10-CM | POA: Diagnosis present

## 2019-02-05 DIAGNOSIS — E1143 Type 2 diabetes mellitus with diabetic autonomic (poly)neuropathy: Secondary | ICD-10-CM | POA: Diagnosis present

## 2019-02-05 DIAGNOSIS — N83292 Other ovarian cyst, left side: Secondary | ICD-10-CM | POA: Diagnosis present

## 2019-02-05 DIAGNOSIS — M797 Fibromyalgia: Secondary | ICD-10-CM | POA: Diagnosis present

## 2019-02-05 DIAGNOSIS — G8929 Other chronic pain: Secondary | ICD-10-CM | POA: Diagnosis present

## 2019-02-05 DIAGNOSIS — M549 Dorsalgia, unspecified: Secondary | ICD-10-CM | POA: Diagnosis present

## 2019-02-05 DIAGNOSIS — L299 Pruritus, unspecified: Secondary | ICD-10-CM | POA: Diagnosis not present

## 2019-02-05 DIAGNOSIS — Z888 Allergy status to other drugs, medicaments and biological substances status: Secondary | ICD-10-CM

## 2019-02-05 DIAGNOSIS — E669 Obesity, unspecified: Secondary | ICD-10-CM | POA: Diagnosis not present

## 2019-02-05 DIAGNOSIS — T402X5A Adverse effect of other opioids, initial encounter: Secondary | ICD-10-CM | POA: Diagnosis not present

## 2019-02-05 DIAGNOSIS — R110 Nausea: Secondary | ICD-10-CM | POA: Diagnosis present

## 2019-02-05 DIAGNOSIS — E119 Type 2 diabetes mellitus without complications: Secondary | ICD-10-CM

## 2019-02-05 DIAGNOSIS — M792 Neuralgia and neuritis, unspecified: Secondary | ICD-10-CM | POA: Diagnosis not present

## 2019-02-05 DIAGNOSIS — E113599 Type 2 diabetes mellitus with proliferative diabetic retinopathy without macular edema, unspecified eye: Secondary | ICD-10-CM | POA: Diagnosis present

## 2019-02-05 DIAGNOSIS — K581 Irritable bowel syndrome with constipation: Secondary | ICD-10-CM | POA: Diagnosis present

## 2019-02-05 DIAGNOSIS — Y92230 Patient room in hospital as the place of occurrence of the external cause: Secondary | ICD-10-CM | POA: Diagnosis not present

## 2019-02-05 DIAGNOSIS — T8571XS Infection and inflammatory reaction due to peritoneal dialysis catheter, sequela: Secondary | ICD-10-CM | POA: Diagnosis not present

## 2019-02-05 DIAGNOSIS — Y838 Other surgical procedures as the cause of abnormal reaction of the patient, or of later complication, without mention of misadventure at the time of the procedure: Secondary | ICD-10-CM | POA: Diagnosis present

## 2019-02-05 DIAGNOSIS — R1032 Left lower quadrant pain: Secondary | ICD-10-CM

## 2019-02-05 DIAGNOSIS — E785 Hyperlipidemia, unspecified: Secondary | ICD-10-CM | POA: Diagnosis present

## 2019-02-05 DIAGNOSIS — R269 Unspecified abnormalities of gait and mobility: Secondary | ICD-10-CM | POA: Diagnosis not present

## 2019-02-05 DIAGNOSIS — N186 End stage renal disease: Secondary | ICD-10-CM | POA: Diagnosis present

## 2019-02-05 DIAGNOSIS — N185 Chronic kidney disease, stage 5: Secondary | ICD-10-CM | POA: Diagnosis present

## 2019-02-05 DIAGNOSIS — N2581 Secondary hyperparathyroidism of renal origin: Secondary | ICD-10-CM | POA: Diagnosis present

## 2019-02-05 DIAGNOSIS — E0590 Thyrotoxicosis, unspecified without thyrotoxic crisis or storm: Secondary | ICD-10-CM | POA: Diagnosis present

## 2019-02-05 DIAGNOSIS — Z841 Family history of disorders of kidney and ureter: Secondary | ICD-10-CM

## 2019-02-05 DIAGNOSIS — R112 Nausea with vomiting, unspecified: Secondary | ICD-10-CM | POA: Diagnosis not present

## 2019-02-05 DIAGNOSIS — R7309 Other abnormal glucose: Secondary | ICD-10-CM | POA: Diagnosis not present

## 2019-02-05 DIAGNOSIS — Z6833 Body mass index (BMI) 33.0-33.9, adult: Secondary | ICD-10-CM

## 2019-02-05 DIAGNOSIS — I150 Renovascular hypertension: Secondary | ICD-10-CM | POA: Diagnosis not present

## 2019-02-05 DIAGNOSIS — Z79899 Other long term (current) drug therapy: Secondary | ICD-10-CM

## 2019-02-05 DIAGNOSIS — D631 Anemia in chronic kidney disease: Secondary | ICD-10-CM | POA: Diagnosis present

## 2019-02-05 DIAGNOSIS — N760 Acute vaginitis: Secondary | ICD-10-CM | POA: Diagnosis present

## 2019-02-05 DIAGNOSIS — Z992 Dependence on renal dialysis: Secondary | ICD-10-CM

## 2019-02-05 DIAGNOSIS — D638 Anemia in other chronic diseases classified elsewhere: Secondary | ICD-10-CM | POA: Diagnosis not present

## 2019-02-05 DIAGNOSIS — T8571XA Infection and inflammatory reaction due to peritoneal dialysis catheter, initial encounter: Secondary | ICD-10-CM | POA: Diagnosis not present

## 2019-02-05 DIAGNOSIS — E8889 Other specified metabolic disorders: Secondary | ICD-10-CM | POA: Diagnosis present

## 2019-02-05 DIAGNOSIS — R5381 Other malaise: Secondary | ICD-10-CM | POA: Diagnosis not present

## 2019-02-05 DIAGNOSIS — E11649 Type 2 diabetes mellitus with hypoglycemia without coma: Secondary | ICD-10-CM | POA: Diagnosis not present

## 2019-02-05 DIAGNOSIS — T85611A Breakdown (mechanical) of intraperitoneal dialysis catheter, initial encounter: Secondary | ICD-10-CM | POA: Diagnosis present

## 2019-02-05 DIAGNOSIS — R0602 Shortness of breath: Secondary | ICD-10-CM

## 2019-02-05 DIAGNOSIS — K3184 Gastroparesis: Secondary | ICD-10-CM | POA: Diagnosis present

## 2019-02-05 DIAGNOSIS — N83202 Unspecified ovarian cyst, left side: Secondary | ICD-10-CM | POA: Diagnosis present

## 2019-02-05 DIAGNOSIS — E876 Hypokalemia: Secondary | ICD-10-CM | POA: Diagnosis present

## 2019-02-05 DIAGNOSIS — Z794 Long term (current) use of insulin: Secondary | ICD-10-CM

## 2019-02-05 DIAGNOSIS — Z833 Family history of diabetes mellitus: Secondary | ICD-10-CM

## 2019-02-05 DIAGNOSIS — T82898A Other specified complication of vascular prosthetic devices, implants and grafts, initial encounter: Secondary | ICD-10-CM | POA: Diagnosis present

## 2019-02-05 DIAGNOSIS — I69354 Hemiplegia and hemiparesis following cerebral infarction affecting left non-dominant side: Secondary | ICD-10-CM

## 2019-02-05 DIAGNOSIS — R52 Pain, unspecified: Secondary | ICD-10-CM

## 2019-02-05 DIAGNOSIS — Z823 Family history of stroke: Secondary | ICD-10-CM

## 2019-02-05 DIAGNOSIS — K659 Peritonitis, unspecified: Secondary | ICD-10-CM | POA: Diagnosis present

## 2019-02-05 DIAGNOSIS — E1169 Type 2 diabetes mellitus with other specified complication: Secondary | ICD-10-CM | POA: Diagnosis not present

## 2019-02-05 DIAGNOSIS — Z885 Allergy status to narcotic agent status: Secondary | ICD-10-CM

## 2019-02-05 DIAGNOSIS — D259 Leiomyoma of uterus, unspecified: Secondary | ICD-10-CM | POA: Diagnosis present

## 2019-02-05 DIAGNOSIS — I10 Essential (primary) hypertension: Secondary | ICD-10-CM

## 2019-02-05 DIAGNOSIS — R0989 Other specified symptoms and signs involving the circulatory and respiratory systems: Secondary | ICD-10-CM | POA: Diagnosis not present

## 2019-02-05 DIAGNOSIS — R55 Syncope and collapse: Secondary | ICD-10-CM | POA: Diagnosis not present

## 2019-02-05 DIAGNOSIS — E1165 Type 2 diabetes mellitus with hyperglycemia: Secondary | ICD-10-CM | POA: Diagnosis present

## 2019-02-05 DIAGNOSIS — Z8249 Family history of ischemic heart disease and other diseases of the circulatory system: Secondary | ICD-10-CM

## 2019-02-05 DIAGNOSIS — D72829 Elevated white blood cell count, unspecified: Secondary | ICD-10-CM | POA: Diagnosis not present

## 2019-02-05 DIAGNOSIS — E1159 Type 2 diabetes mellitus with other circulatory complications: Secondary | ICD-10-CM | POA: Diagnosis present

## 2019-02-05 LAB — LACTIC ACID, PLASMA: Lactic Acid, Venous: 1.5 mmol/L (ref 0.5–1.9)

## 2019-02-05 LAB — CBC
HCT: 31.6 % — ABNORMAL LOW (ref 36.0–46.0)
Hemoglobin: 9.4 g/dL — ABNORMAL LOW (ref 12.0–15.0)
MCH: 28.7 pg (ref 26.0–34.0)
MCHC: 29.7 g/dL — ABNORMAL LOW (ref 30.0–36.0)
MCV: 96.6 fL (ref 80.0–100.0)
PLATELETS: 277 10*3/uL (ref 150–400)
RBC: 3.27 MIL/uL — ABNORMAL LOW (ref 3.87–5.11)
RDW: 13.8 % (ref 11.5–15.5)
WBC: 11.4 10*3/uL — ABNORMAL HIGH (ref 4.0–10.5)
nRBC: 0 % (ref 0.0–0.2)

## 2019-02-05 LAB — COMPREHENSIVE METABOLIC PANEL
ALBUMIN: 2.3 g/dL — AB (ref 3.5–5.0)
ALT: 16 U/L (ref 0–44)
AST: 13 U/L — ABNORMAL LOW (ref 15–41)
Alkaline Phosphatase: 129 U/L — ABNORMAL HIGH (ref 38–126)
Anion gap: 12 (ref 5–15)
BUN: 69 mg/dL — ABNORMAL HIGH (ref 6–20)
CO2: 22 mmol/L (ref 22–32)
Calcium: 6.9 mg/dL — ABNORMAL LOW (ref 8.9–10.3)
Chloride: 101 mmol/L (ref 98–111)
Creatinine, Ser: 11.03 mg/dL — ABNORMAL HIGH (ref 0.44–1.00)
GFR calc Af Amer: 4 mL/min — ABNORMAL LOW (ref >60)
GFR calc non Af Amer: 4 mL/min — ABNORMAL LOW (ref >60)
GLUCOSE: 235 mg/dL — AB (ref 70–99)
Potassium: 3.9 mmol/L (ref 3.5–5.1)
Sodium: 135 mmol/L (ref 135–145)
Total Bilirubin: 1 mg/dL (ref 0.3–1.2)
Total Protein: 7.3 g/dL (ref 6.5–8.1)

## 2019-02-05 LAB — GC/CHLAMYDIA PROBE AMP (~~LOC~~) NOT AT ARMC
Chlamydia: NEGATIVE
Neisseria Gonorrhea: NEGATIVE

## 2019-02-05 LAB — VANCOMYCIN, RANDOM: Vancomycin Rm: 23

## 2019-02-05 LAB — PATHOLOGIST SMEAR REVIEW

## 2019-02-05 LAB — CREATININE, SERUM
Creatinine, Ser: 10.68 mg/dL — ABNORMAL HIGH (ref 0.44–1.00)
GFR calc Af Amer: 4 mL/min — ABNORMAL LOW (ref >60)
GFR calc non Af Amer: 4 mL/min — ABNORMAL LOW (ref >60)

## 2019-02-05 LAB — LIPASE, BLOOD: Lipase: 43 U/L (ref 11–51)

## 2019-02-05 LAB — GLUCOSE, CAPILLARY: Glucose-Capillary: 263 mg/dL — ABNORMAL HIGH (ref 70–99)

## 2019-02-05 MED ORDER — ONDANSETRON 4 MG PO TBDP
4.0000 mg | ORAL_TABLET | Freq: Three times a day (TID) | ORAL | Status: DC | PRN
  Administered 2019-02-08: 4 mg via ORAL
  Filled 2019-02-05 (×3): qty 1

## 2019-02-05 MED ORDER — GABAPENTIN 300 MG PO CAPS: 300.0000 mg | ORAL_CAPSULE | Freq: Two times a day (BID) | ORAL | Status: DC | PRN

## 2019-02-05 MED ORDER — SIMVASTATIN 5 MG PO TABS
5.0000 mg | ORAL_TABLET | Freq: Every day | ORAL | Status: DC
  Administered 2019-02-06 – 2019-02-12 (×7): 5 mg via ORAL
  Filled 2019-02-05 (×9): qty 1

## 2019-02-05 MED ORDER — HEPARIN SODIUM (PORCINE) 5000 UNIT/ML IJ SOLN
5000.0000 [IU] | Freq: Three times a day (TID) | INTRAMUSCULAR | Status: DC
  Administered 2019-02-05 – 2019-02-13 (×24): 5000 [IU] via SUBCUTANEOUS
  Filled 2019-02-05 (×25): qty 1

## 2019-02-05 MED ORDER — DELFLEX-LC/2.5% DEXTROSE 394 MOSM/L IP SOLN: INTRAPERITONEAL | Status: DC

## 2019-02-05 MED ORDER — METRONIDAZOLE IN NACL 5-0.79 MG/ML-% IV SOLN
500.0000 mg | Freq: Once | INTRAVENOUS | Status: AC
  Administered 2019-02-05: 500 mg via INTRAVENOUS
  Filled 2019-02-05: qty 100

## 2019-02-05 MED ORDER — LIDOCAINE-PRILOCAINE 2.5-2.5 % EX CREA: 1.0000 "application " | TOPICAL_CREAM | CUTANEOUS | Status: DC

## 2019-02-05 MED ORDER — HEPARIN 1000 UNIT/ML FOR PERITONEAL DIALYSIS: 500.0000 [IU] | INTRAMUSCULAR | Status: DC | PRN

## 2019-02-05 MED ORDER — INSULIN ASPART 100 UNIT/ML ~~LOC~~ SOLN: 6.0000 [IU] | Freq: Three times a day (TID) | SUBCUTANEOUS | Status: DC

## 2019-02-05 MED ORDER — INSULIN ASPART 100 UNIT/ML ~~LOC~~ SOLN
6.0000 [IU] | Freq: Every day | SUBCUTANEOUS | Status: DC
  Administered 2019-02-06 – 2019-02-11 (×4): 6 [IU] via SUBCUTANEOUS

## 2019-02-05 MED ORDER — VANCOMYCIN 100 MG/ML FOR DIALYSIS: 25.0000 mg | INJECTION | Freq: Once | Status: DC

## 2019-02-05 MED ORDER — DIPHENHYDRAMINE HCL 50 MG/ML IJ SOLN
12.5000 mg | Freq: Four times a day (QID) | INTRAMUSCULAR | Status: DC | PRN
  Administered 2019-02-08 – 2019-02-13 (×4): 12.5 mg via INTRAVENOUS
  Filled 2019-02-05 (×4): qty 1

## 2019-02-05 MED ORDER — HEPARIN 1000 UNIT/ML FOR PERITONEAL DIALYSIS
INTRAPERITONEAL | Status: DC | PRN
  Filled 2019-02-05: qty 5000

## 2019-02-05 MED ORDER — BACLOFEN 5 MG HALF TABLET
5.0000 mg | ORAL_TABLET | Freq: Two times a day (BID) | ORAL | Status: DC | PRN
  Filled 2019-02-05: qty 1

## 2019-02-05 MED ORDER — GENTAMICIN SULFATE 0.1 % EX CREA: 1.0000 "application " | TOPICAL_CREAM | Freq: Every day | CUTANEOUS | Status: DC

## 2019-02-05 MED ORDER — SODIUM CHLORIDE 0.9 % IV SOLN
1.0000 g | Freq: Once | INTRAVENOUS | Status: DC
  Filled 2019-02-05: qty 1

## 2019-02-05 MED ORDER — INSULIN ASPART 100 UNIT/ML ~~LOC~~ SOLN
0.0000 [IU] | Freq: Three times a day (TID) | SUBCUTANEOUS | Status: DC
  Administered 2019-02-06: 3 [IU] via SUBCUTANEOUS
  Administered 2019-02-06: 2 [IU] via SUBCUTANEOUS
  Administered 2019-02-07: 5 [IU] via SUBCUTANEOUS
  Administered 2019-02-07: 2 [IU] via SUBCUTANEOUS
  Administered 2019-02-08: 3 [IU] via SUBCUTANEOUS
  Administered 2019-02-09 – 2019-02-10 (×2): 2 [IU] via SUBCUTANEOUS
  Administered 2019-02-11: 3 [IU] via SUBCUTANEOUS
  Administered 2019-02-11 – 2019-02-12 (×2): 8 [IU] via SUBCUTANEOUS
  Administered 2019-02-12: 2 [IU] via SUBCUTANEOUS
  Administered 2019-02-13: 5 [IU] via SUBCUTANEOUS

## 2019-02-05 MED ORDER — INSULIN GLARGINE 100 UNIT/ML ~~LOC~~ SOLN
28.0000 [IU] | Freq: Every day | SUBCUTANEOUS | Status: DC
  Administered 2019-02-06 – 2019-02-12 (×8): 28 [IU] via SUBCUTANEOUS
  Filled 2019-02-05 (×8): qty 0.28

## 2019-02-05 MED ORDER — INSULIN ASPART 100 UNIT/ML ~~LOC~~ SOLN
8.0000 [IU] | Freq: Every day | SUBCUTANEOUS | Status: DC
  Administered 2019-02-06 – 2019-02-13 (×5): 8 [IU] via SUBCUTANEOUS

## 2019-02-05 MED ORDER — DELFLEX-LC/4.25% DEXTROSE 483 MOSM/L IP SOLN: INTRAPERITONEAL | Status: DC

## 2019-02-05 MED ORDER — DIPHENHYDRAMINE HCL 25 MG PO CAPS: 25.0000 mg | ORAL_CAPSULE | Freq: Four times a day (QID) | ORAL | Status: DC | PRN

## 2019-02-05 MED ORDER — CARVEDILOL 12.5 MG PO TABS
6.2500 mg | ORAL_TABLET | Freq: Two times a day (BID) | ORAL | Status: DC
  Administered 2019-02-06 – 2019-02-13 (×14): 6.25 mg via ORAL
  Filled 2019-02-05 (×16): qty 1

## 2019-02-05 MED ORDER — HYDROMORPHONE HCL 1 MG/ML IJ SOLN
1.0000 mg | INTRAMUSCULAR | Status: DC | PRN
  Administered 2019-02-05 – 2019-02-06 (×2): 1 mg via INTRAMUSCULAR
  Filled 2019-02-05 (×2): qty 1

## 2019-02-05 MED ORDER — SODIUM CHLORIDE 0.9% FLUSH: 3.0000 mL | Freq: Once | INTRAVENOUS | Status: DC

## 2019-02-05 MED ORDER — PROMETHAZINE HCL 25 MG PO TABS
25.0000 mg | ORAL_TABLET | Freq: Two times a day (BID) | ORAL | Status: DC | PRN
  Administered 2019-02-05: 25 mg via ORAL
  Filled 2019-02-05: qty 1

## 2019-02-05 MED ORDER — SODIUM CHLORIDE 0.9% FLUSH
3.0000 mL | Freq: Two times a day (BID) | INTRAVENOUS | Status: DC
  Administered 2019-02-06 – 2019-02-13 (×14): 3 mL via INTRAVENOUS

## 2019-02-05 MED ORDER — CEFTAZIDIME 200 MG/ML FOR DIALYSIS: 125.0000 mg | INJECTION | Freq: Once | INTRAVENOUS_CENTRAL | Status: DC

## 2019-02-05 MED ORDER — LUBIPROSTONE 24 MCG PO CAPS
24.0000 ug | ORAL_CAPSULE | Freq: Two times a day (BID) | ORAL | Status: DC | PRN
  Filled 2019-02-05: qty 1

## 2019-02-05 MED ORDER — PROMETHAZINE HCL 25 MG/ML IJ SOLN
12.5000 mg | Freq: Once | INTRAMUSCULAR | Status: AC
  Administered 2019-02-06: 12.5 mg via INTRAMUSCULAR
  Filled 2019-02-05: qty 1

## 2019-02-05 MED ORDER — HYDROMORPHONE HCL 1 MG/ML IJ SOLN
1.0000 mg | INTRAMUSCULAR | Status: DC | PRN
  Administered 2019-02-05 (×2): 1 mg via INTRAVENOUS
  Filled 2019-02-05 (×2): qty 1

## 2019-02-05 MED ORDER — ONDANSETRON HCL 4 MG PO TABS: 4.0000 mg | ORAL_TABLET | Freq: Four times a day (QID) | ORAL | Status: DC | PRN

## 2019-02-05 MED ORDER — GENTAMICIN SULFATE 0.1 % EX CREA
1.0000 "application " | TOPICAL_CREAM | Freq: Every day | CUTANEOUS | Status: DC
  Filled 2019-02-05: qty 15

## 2019-02-05 MED ORDER — INSULIN DEGLUDEC 100 UNIT/ML ~~LOC~~ SOPN: 28.0000 [IU] | PEN_INJECTOR | Freq: Every day | SUBCUTANEOUS | Status: DC

## 2019-02-05 MED ORDER — FENTANYL CITRATE (PF) 100 MCG/2ML IJ SOLN
50.0000 ug | Freq: Once | INTRAMUSCULAR | Status: AC
  Administered 2019-02-05: 50 ug via INTRAVENOUS
  Filled 2019-02-05: qty 2

## 2019-02-05 MED ORDER — ONDANSETRON HCL 4 MG/2ML IJ SOLN
4.0000 mg | Freq: Once | INTRAMUSCULAR | Status: AC
  Administered 2019-02-05: 4 mg via INTRAVENOUS
  Filled 2019-02-05: qty 2

## 2019-02-05 MED ORDER — INSULIN ASPART 100 UNIT/ML ~~LOC~~ SOLN
8.0000 [IU] | Freq: Every day | SUBCUTANEOUS | Status: DC
  Administered 2019-02-06 – 2019-02-11 (×4): 8 [IU] via SUBCUTANEOUS

## 2019-02-05 MED ORDER — ZOLPIDEM TARTRATE 5 MG PO TABS
5.0000 mg | ORAL_TABLET | Freq: Every evening | ORAL | Status: DC | PRN
  Administered 2019-02-07 – 2019-02-08 (×2): 5 mg via ORAL
  Filled 2019-02-05 (×2): qty 1

## 2019-02-05 MED ORDER — ONDANSETRON HCL 4 MG/2ML IJ SOLN
4.0000 mg | Freq: Four times a day (QID) | INTRAMUSCULAR | Status: DC | PRN
  Filled 2019-02-05: qty 2

## 2019-02-05 NOTE — Progress Notes (Signed)
Spoke with pharmacist re: pt's no PIV and unable to get Fortaz,advised to contact MD. Text paged Bodenheimer,NP and made aware of pt unable to get fortaz IV since no PIV,also pt's request for IM dilaudid. Order received for IM dilaudid.Valaria Good, Wonda Cheng, RN

## 2019-02-05 NOTE — ED Notes (Signed)
Attempted IV to LAC, no success.

## 2019-02-05 NOTE — Progress Notes (Signed)
Patient's peritoneal dialysis machine beeping,saying tubing is blocked. Checked tubing to make sure it's not kinked,tubing is free of kinks. Heather [name],HD RN made aware but she said she's the only one and doing hemodialysis so she cannot come down. Will continue to monitor. Heather [name], Wonda Cheng, Therapist, sports

## 2019-02-05 NOTE — ED Notes (Signed)
Patient transported to CT 

## 2019-02-05 NOTE — H&P (Signed)
History and Physical    Heather [name] PQZ:[id number] DOB: 1971-03-02 DOA: 02/05/2019  PCP: Briscoe Deutscher, DO   Patient coming from: Home  I have personally briefly reviewed patient's old medical records in [place]  Chief Complaint: Severe abdominal pain for days  HPI: Heather [name] is a 47 y.o. female with medical history significant of type 2 diabetes mellitus, end-stage renal disease on peritoneal dialysis since July 2019, chronic constipation, nonhemorrhagic cerebrovascular accident, fibromyalgia, double bowel syndrome, proliferative diabetic retinopathy, presents the emergency department complaints of worsening left lower abdominal pain.  It is worse when she tries to do dialysis.  She does peritoneal dialysis at home and was able to complete dialysis 4 days ago.  She has had significant nausea vomiting and difficulty eating.  She only urinates once a day and has been doing so normally.  2 days ago she was seen in the emergency department and was treated for peritonitis with a dose of Vanco and cefepime with plans to follow-up at the dialysis center today.  Fortunately she had such severe abdominal pain that she could not get to the dialysis center and came to the emergency room instead.  He had been started on metronidazole for bacterial vaginosis but has yet started the medication.  She has been taking this because of diagnosis made due to discharge that she complained of previously.  ED Course: Seen in [facility] ED on 2 8 given cefepime and Vanco treated for peritonitis, CT scan shows possible hemorrhagic ovarian cyst pelvic ultrasound is pending.  Nephrology consulted who recommended admission to the hospital for peritoneal antibiotic therapy.  IV metronidazole given as patient is having nausea and dry heaves and cannot tolerate oral antibiotics.  Referred to [facility] for further evaluation and management.  Review of Systems: As per HPI otherwise all other systems  reviewed and  negative.    Past Medical History:  Diagnosis Date  . Antiplatelet or antithrombotic long-term use: Plavix 06/30/2017  . B12 deficiency 05/10/2014  . Blood transfusion without reported diagnosis   . Chronic constipation   . CVA (cerebral vascular accident) ([place]), nonhemorrhagic, inferior right cerebellum 12/03/2017  . Cyst of left ovary 01/12/2018  . Diabetic neuropathy, painful ([place]), on low dose Gabapentin 07/13/2013  . DM (diabetes mellitus), type 2, uncontrolled, with renal complications ([place]) 33/54/5625  . DM2 (diabetes mellitus, type 2) ([place])   . Dysfunction of left eustachian tube, with pusatile tinnitus 11/24/2017  . Dyslipidemia associated with type 2 diabetes mellitus ([place]) 06/25/2016  . ESRD (end stage renal disease) on dialysis ([place])   . ESRD with anemia ([place])   . Fibromyalgia   . Gastroparesis due to DM   . GERD (gastroesophageal reflux disease)   . Hypertension associated with diabetes ([place]) 02/19/2011  . IBS (irritable bowel syndrome)   . Left-sided weakness 12/10/2017   .  [name] Leiomyoma of uterus   . Pancreatitis   . Proliferative diabetic retinopathy ([place]) 09/29/2015  . 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 Reactive depression 12/10/2017   .  [name] Right-sided low back pain without sciatica 12/08/2015  . Secondary hyperthyroidism 11/27/2013  . Steal syndrome dialysis vascular access ([place]) 02/17/2018  . Thromboembolism ([place]) 04/05/2018  . Upper airway cough syndrome, with recs to stay off ACE and take Pepcid q hs 11/15/2016    Past Surgical History:  Procedure Laterality Date  . ARTERY REPAIR Right 02/17/2018   Procedure: EXPLORATION OF RIGHT BRACHIAL ARTERY;  Surgeon: Conrad [place], MD;  Location: [facility] OR;  Service: Vascular;  Laterality: Right;  . ARTERY REPAIR Right 02/17/2018   Procedure: BRACHIAL ARTERY EXPLORATION AND TRHOMBECTOMY;  Surgeon: Conrad [place], MD;  Location: [facility] OR;  Service: Vascular;  Laterality: Right;  . AV FISTULA PLACEMENT Left  05/09/2017   Procedure: INSERTION OF ARTERIOVENOUS (AV) GRAFT ARM (ARTEGRAFT);  Surgeon: Conrad [place], MD;  Location: [facility] OR;  Service: Vascular;  Laterality: Left;  . AV FISTULA PLACEMENT Right 02/17/2018   Procedure: INSERTION OF ARTERIOVENOUS (AV) GORE-TEX GRAFT ARM RIGHT UPPER ARM;  Surgeon: Conrad [place]ola, MD;  Location: [place];  Service: Vascular;  Laterality: Right;  . BASCILIC VEIN TRANSPOSITION Left 08/31/2016   Procedure: BASILIC VEIN TRANSPOSITION FIRST STAGE;  Surgeon: Conrad [place], MD;  Location: [place];  Service: Vascular;  Laterality: Left;  . CENTRAL VENOUS CATHETER INSERTION Left 07/24/2018   Procedure: INSERTION CENTRAL LINE ADULT;  Surgeon: Conrad [place], MD;  Location: [place];  Service: Vascular;  Laterality: Left;  . EYE SURGERY     secondary to diabetic retinopathy   . FOOT SURGERY Right    t"ook bone out- maybe hammer toe"  . INSERTION OF DIALYSIS CATHETER Left 07/24/2018   Procedure: INSERTION OF TUNNELED DIALYSIS CATHETER;  Surgeon: Conrad Roann, MD;  Location: [place];  Service: Vascular;  Laterality: Left;  . IR FLUORO GUIDE CV LINE RIGHT  07/21/2018  . IR US GUIDE VASC ACCESS RIGHT  07/21/2018  . LIGATION OF ARTERIOVENOUS  FISTULA Left 05/09/2017   Procedure: LIGATION OF ARTERIOVENOUS  FISTULA;  Surgeon: Conrad [place], MD;  Location: [place];  Service: Vascular;  Laterality: Left;  . LOOP RECORDER INSERTION N/A 12/05/2017   Procedure: LOOP RECORDER INSERTION;  Surgeon: Evans Lance, MD;  Location: [place] CV LAB;  Service: Cardiovascular;  Laterality: N/A;  . MYOMECTOMY    . REMOVAL OF GRAFT Right 02/17/2018   Procedure: REMOVAL OF RIGHT UPPER ARM ARTERIOVENOUS GRAFT;  Surgeon: Conrad [place], MD;  Location: [place];  Service: Vascular;  Laterality: Right;  . REVISION OF ARTERIOVENOUS GORETEX GRAFT Left 07/24/2018   Procedure: REDO ARTERIOVENOUS GORETEX GRAFT;  Surgeon: Conrad [place], MD;  Location: [place];  Service: Vascular;  Laterality: Left;  . TEE WITHOUT CARDIOVERSION N/A  12/05/2017   Procedure: TRANSESOPHAGEAL ECHOCARDIOGRAM (TEE);  Surgeon: Sanda Klein, MD;  Location: [facility] ENDOSCOPY;  Service: Cardiovascular;  Laterality: N/A;  . UPPER EXTREMITY VENOGRAPHY Left 07/13/2018   Procedure: UPPER EXTREMITY VENOGRAPHY - Central & Left Arm;  Surgeon: Conrad , MD;  Location: [place] CV LAB;  Service: Cardiovascular;  Laterality: Left;  . UTERINE FIBROID SURGERY    . VITRECTOMY Bilateral     Social History   Social History Narrative   Patient lives with fiance. She has 1 grown son. Works full-time, she Paramedic for attorney.   Caffeine Use: 2 cups daily; sodas occasionally   She has 7 grandchildren     reports that she has never smoked. She has never used smokeless tobacco. She reports that she does not drink alcohol or use drugs.  Allergies  Allergen Reactions  . Ibuprofen Other (See Comments)    CKD stage 3. Should avoid.  . Dilaudid [Hydromorphone Hcl] Itching and Other (See Comments)    Can take with Benadryl.  . Tramadol Itching    Family History  Problem Relation Age of Onset  . Colon polyps Mother   . Diabetes Mother   . Heart murmur Father  history essentially unknown  . Hypertension Father   . Diabetes Sister   . Kidney failure Sister        kidney transplant  . Diabetes Brother   . Retinal degeneration Brother   . Heart murmur Son   . Stroke Paternal Grandmother   . Diabetes Sister      Prior to Admission medications   Medication Sig Start Date End Date Taking? Authorizing Provider  baclofen (LIORESAL) 10 MG tablet TAKE 1/2 TABLET BY MOUTH 2 (TWO) TIMES DAILY AS NEEDED FOR MUSCLE SPASMS. 01/26/19  Yes Briscoe Deutscher, DO  carvedilol (COREG) 6.25 MG tablet Take 1 tablet (6.25 mg total) by mouth 2 (two) times daily with a meal. 08/09/18  Yes Briscoe Deutscher, DO  gabapentin (NEURONTIN) 300 MG capsule Take 1 capsule (300 mg total) by mouth 2 (two) times daily. Patient taking differently: Take 300 mg by mouth 2 (two)  times daily as needed (pain).  04/11/18  Yes Briscoe Deutscher, DO  insulin aspart (NOVOLOG) 100 UNIT/ML injection Inject 6-8 Units into the skin 3 (three) times daily before meals. 8 UNITS WITH BREAKFAST, 6 UNITS WITH LUNCH, & 8 UNITS WITH SUPPER   Yes [provider]  insulin degludec (TRESIBA FLEXTOUCH) 100 UNIT/ML SOPN FlexTouch Pen Inject 0.2 mLs (20 Units total) into the skin daily with lunch. Patient taking differently: Inject 28 Units into the skin daily with lunch.  07/26/18  Yes Danford, Suann Larry, MD  lidocaine-prilocaine (EMLA) cream Apply 1 application topically as directed. For Dialysis use 08/17/18  Yes [provider]  lubiprostone (AMITIZA) 24 MCG capsule Take 1 capsule (24 mcg total) by mouth 2 (two) times daily with a meal. Patient taking differently: Take 24 mcg by mouth 2 (two) times daily with a meal. As needed 12/05/18  Yes Zehr, Janett Billow D, PA-C  oxyCODONE (ROXICODONE) 5 MG immediate release tablet Take 0.5 tablets (2.5 mg total) by mouth every 6 (six) hours as needed for severe pain. 12/18/18  Yes Briscoe Deutscher, DO  promethazine (PHENERGAN) 25 MG tablet Take 25 mg by mouth every 12 (twelve) hours as needed for nausea. 11/24/18  Yes [provider]  simvastatin (ZOCOR) 10 MG tablet Take 0.5 tablets (5 mg total) by mouth at bedtime. 1/2 po q hs - GFR 14 04/11/18  Yes Briscoe Deutscher, DO  metroNIDAZOLE (FLAGYL) 500 MG tablet Take 1 tablet (500 mg total) by mouth 2 (two) times daily for 7 days. 02/03/19 02/10/19  Bufford Lope, DO    Physical Exam:  Constitutional: Obviously very uncomfortable, hurts to turn or to sit up.  Acute respiratory distress Vitals:   02/05/19 1430 02/05/19 1530 02/05/19 1600 02/05/19 1630  BP: (!) 174/87 (!) 160/81 (!) 146/65 (!) 167/84  Pulse: 88 87 91 88  Resp:  18 17   Temp:      TempSrc:      SpO2: 100% 99% 97% 97%   Eyes: PERRL, lids and conjunctivae normal ENMT: Mucous membranes are moist. Posterior pharynx clear of any  exudate or lesions.Normal dentition.  Neck: normal, supple, no masses, no thyromegaly Respiratory: clear to auscultation bilaterally, no wheezing, no crackles. Normal respiratory effort. No accessory muscle use.  Cardiovascular: Regular rate and rhythm, no murmurs / rubs / gallops. No extremity edema. 2+ pedal pulses. No carotid bruits.  Abdomen: Significant diffuse tenderness mild palpation, no masses palpated. No hepatosplenomegaly. Bowel sounds positive.  Musculoskeletal: no clubbing / cyanosis. No joint deformity upper and lower extremities. Good ROM, no contractures. Normal muscle tone.  Skin: no rashes, lesions, ulcers. No induration Neurologic: CN 2-12 grossly intact. Sensation intact, DTR normal. Strength 5/5 in all 4.  Psychiatric: Normal judgment and insight. Alert and oriented x 3. Normal mood.   Labs on Admission: I have personally reviewed following labs and imaging studies  CBC: Recent Labs  Lab 02/03/19 2015 02/05/19 0932  WBC 11.3* 11.4*  HGB 9.2* 9.4*  HCT 30.0* 31.6*  MCV 94.9 96.6  PLT 273 644   Basic Metabolic Panel: Recent Labs  Lab 02/03/19 2015 02/05/19 0932  NA 130* 135  K 3.7 3.9  CL 95* 101  CO2 21* 22  GLUCOSE 257* 235*  BUN 62* 69*  CREATININE 10.10* 11.03*  CALCIUM 7.0* 6.9*  PHOS 5.1*  --    GFR: Estimated Creatinine Clearance: 7.8 mL/min (A) (by C-G formula based on SCr of 11.03 mg/dL (H)). Liver Function Tests: Recent Labs  Lab 02/03/19 2015 02/05/19 0932  AST  --  13*  ALT  --  16  ALKPHOS  --  129*  BILITOT  --  1.0  PROT  --  7.3  ALBUMIN 2.9* 2.3*   Recent Labs  Lab 02/03/19 2015 02/05/19 0932  LIPASE 59* 43   BNP (last 3 results) Recent Labs    04/11/18 1421  PROBNP 65.0   Urine analysis:    Component Value Date/Time   COLORURINE STRAW (A) 07/11/2018 2004   APPEARANCEUR HAZY (A) 07/11/2018 2004   LABSPEC 1.008 07/11/2018 2004   PHURINE 7.0 07/11/2018 2004   GLUCOSEU 50 (A) 07/11/2018 2004   HGBUR NEGATIVE  07/11/2018 2004   BILIRUBINUR NEGATIVE 07/11/2018 2004   BILIRUBINUR Negative 07/29/2017 0802   BILIRUBINUR neg 08/08/2013   KETONESUR NEGATIVE 07/11/2018 2004   PROTEINUR >=300 (A) 07/11/2018 2004   UROBILINOGEN 0.2 07/29/2017 0802   UROBILINOGEN 0.2 09/07/2015 1230   NITRITE NEGATIVE 07/11/2018 2004   LEUKOCYTESUR NEGATIVE 07/11/2018 2004    Radiological Exams on Admission: Ct Abdomen Pelvis Wo Contrast  Result Date: 02/05/2019 CLINICAL DATA:  Left lower quadrant pain for 5 days. Peritoneal dialysis patient. EXAM: CT ABDOMEN AND PELVIS WITHOUT CONTRAST TECHNIQUE: Multidetector CT imaging of the abdomen and pelvis was performed following the standard protocol without IV contrast. COMPARISON:  February 03, 2019 FINDINGS: Lower chest: There is a tiny left pleural effusion. Bibasilar atelectasis. No other abnormalities in the lower chest. Hepatobiliary: There is a focus of air in the right upper quadrant, seen on series 3, image 13 and coronal image 50, thought to be adjacent to rather than within the liver. The amount of air adjacent to the liver has decreased in the interval. No other definitive free air in the abdomen or pelvis identified. The liver and gallbladder are unremarkable on this unenhanced study. Pancreas: Unremarkable. No pancreatic ductal dilatation or surrounding inflammatory changes. Spleen: Normal in size without focal abnormality. Adrenals/Urinary Tract: Adrenal glands are normal. There is a tiny stone in the upper pole of the left kidney. No other renal stones identified. No hydronephrosis or perinephric stranding. No ureterectasis or ureteral stones. The bladder is unremarkable. Stomach/Bowel: The stomach and small bowel are normal. The colon is unremarkable. No evidence of colitis. The appendix is unremarkable. Vascular/Lymphatic: Atherosclerotic changes seen in the nonaneurysmal aorta. No adenopathy. Reproductive: Uterus is normal. The left ovary is larger than the right. There is  high attenuation in the inferior left ovary on the previous study, probably a hemorrhagic cyst. This is more difficult to visualize today but still remains. Other: The free air  seen previously in the abdomen has decreased with only a single focus seen adjacent to the liver. A small amount of perihepatic fluid may be from previous ascites. The high attenuation in the anterior abdominal fat remains, nonspecific. Adjacent loops of bowel are normal. High attenuation in the subcutaneous fat likely represents mild volume overload. No other acute abnormalities. Musculoskeletal: No acute or significant osseous findings. IMPRESSION: 1. Decreasing free air in the abdomen with only a tiny focus remaining adjacent to the liver, likely due to previous dialysis. The small amount of fluid in the abdomen is likely also due to previous dialysis. 2. Suspected hemorrhagic cyst in the left ovary. A pelvic ultrasound could better evaluate. 3. Tiny left pleural effusion. 4. Tiny nonobstructive stone in the left kidney. 5. Atherosclerotic changes in the nonaneurysmal aorta. Electronically Signed   By: Dorise Bullion III M.D   On: 02/05/2019 12:29   Ct Abdomen Pelvis Wo Contrast  Result Date: 02/03/2019 CLINICAL DATA:  Abdominal and LEFT pelvic pain for 3 days with nausea and vomiting question diverticulitis; history of pancreatitis, GERD, at type II diabetes mellitus, hypertension, stroke, end-stage renal disease on dialysis EXAM: CT ABDOMEN AND PELVIS WITHOUT CONTRAST TECHNIQUE: Multidetector CT imaging of the abdomen and pelvis was performed following the standard protocol without IV contrast. Sagittal and coronal MPR images reconstructed from axial data set. Patient drank dilute oral contrast for exam. COMPARISON:  04/14/2017 FINDINGS: Lower chest: Lung bases clear Hepatobiliary: Contracted gallbladder. No focal hepatic abnormalities. Pancreas: Normal appearance Spleen: Normal appearance Adrenals/Urinary Tract: Adrenal glands  normal appearance. Kidneys small with tiny nonobstructing calculi in LEFT kidney. No renal mass, hydronephrosis or ureteral dilatation. Bladder unremarkable. Stomach/Bowel: Normal appendix. Stomach unremarkable. Questionable wall thickening of the transverse colon versus artifact from underdistention. No definite small bowel abnormalities are identified. An area of infiltration of intra-abdominal fat in anterior LEFT mid abdomen is identified, nonspecific, approximately 4.5 x 1.5 x 3.7 cm in size, without definite wall thickening or abnormalities of the closest small bowel loops, of uncertain significance. Vascular/Lymphatic: Atherosclerotic calcifications aorta and iliac arteries without aneurysm. Small pericardial effusion is present. No adenopathy. Scattered pelvic phleboliths. Reproductive: Unremarkable uterus and ovaries. Other: Peritoneal dialysis catheter in pelvis. Minimal scattered peritoneal fluid. Tiny foci of intraperitoneal free air are identified, nonspecific, may be related to peritoneal dialysis. No definite hernia or additional inflammatory changes. Musculoskeletal: No acute osseous findings. IMPRESSION: Focus of nonspecific infiltration in the intraperitoneal fat of the anterior LEFT mid abdomen, area approximately 4.5 x 1.5 x 3.7 cm, could potentially be related to infiltration from peritoneal dialysate, focal nonspecific inflammatory process not excluded. Questionable mild wall thickening of the transverse colon versus artifact from underdistention, cannot exclude colitis. Small pericardial effusion. Foci of nonspecific free air, which could be related to peritoneal dialysis. Electronically Signed   By: Lavonia Dana M.D.   On: 02/03/2019 22:19   US Pelvic Complete W Transvaginal And Torsion R/o  Result Date: 02/05/2019 CLINICAL DATA:  Left ovarian hemorrhagic cyst on CT scan. EXAM: TRANSABDOMINAL AND TRANSVAGINAL ULTRASOUND OF PELVIS DOPPLER ULTRASOUND OF OVARIES TECHNIQUE: Both transabdominal  and transvaginal ultrasound examinations of the pelvis were performed. Transabdominal technique was performed for global imaging of the pelvis including uterus, ovaries, adnexal regions, and pelvic cul-de-sac. It was necessary to proceed with endovaginal exam following the transabdominal exam to visualize the ovaries. Color and duplex Doppler ultrasound was utilized to evaluate blood flow to the ovaries. COMPARISON:  CT scan of same day. FINDINGS: Uterus Measurements: 11.7 x 6.5  x 6.3 cm = volume: 252 mL. Two small fibroids are noted, with the largest measuring 1.5 cm posteriorly. Endometrium Thickness: 10 mm which is within normal limits for patient of reproductive age. No focal abnormality visualized. Right ovary Measurements: 3.1 x 2.0 x 1.3 cm = volume: 4 mL. Normal appearance/no adnexal mass. Left ovary Measurements: 4.9 x 3.4 x 3.2 cm = volume: 27 mL. Multiple cysts are noted, with the largest measuring 2.8 cm which is mildly complex and concerning for hemorrhagic cyst. Pulsed Doppler evaluation of both ovaries demonstrates normal low-resistance arterial and venous waveforms. Other findings No abnormal free fluid. IMPRESSION: 2.8 cm mildly complex left ovarian cyst is noted concerning for hemorrhagic cyst. Short-interval follow up ultrasound in 6-12 weeks is recommended, preferably during the week following the patient's normal menses. Small uterine fibroids are noted as well. Electronically Signed   By: Marijo Conception, M.D.   On: 02/05/2019 15:35      Assessment/Plan Principal Problem:   Peritonitis, dialysis-associated (HCC) Active Problems:   CKD (chronic kidney disease) stage 5, GFR less than 15 ml/min (HCC)   Peritonitis, dialysis-associated, initial encounter ([place])   Diabetes mellitus, type II, insulin dependent ([place]), on CGM, with end organ damage: renal, neuropathy, gastroparesis, retinopathy   Peritoneal dialysis catheter in place [facility])   Left ovarian cyst   Hypertension associated with  diabetes ([place])   Gastroparesis due to DM ([place])   Secondary hyperparathyroidism ([place])  1.  Peritonitis associated with dialysis: Patient to be admitted into the hospital.  Discussed the case with Dr. Grayland Ormond from nephrology who recommends vancomycin however we are awaiting a vancomycin level to start treatment.  Presently will give the patient diet should she choose to eat.  Continue antiemetics and pain medication.  She had little relief with fentanyl so therefore I am going to give her 1 mg of Dilaudid every 2-3 hours as needed for pain.  2.  Chronic kidney disease stage far with a GFR of less than 15: Peritoneal dialysis as above.  3.  Diabetes type 2 insulin-dependent with endorgan damage renal, neuropathy, gastroparesis, retinopathy: Continue management as able sliding scale coverage.  4.  Left ovarian cyst: This may be contributing to her pain syndrome.  Pelvic ultrasound is also noted.  Will be for for possible GYN consultation.  5.  Hypertension associated with diabetes: Continue home medication management.  6.  Gastroparesis due to diabetes: Continue home medication management.  7.  Secondary hyperparathyroidism: Noted continue home medication management.  DVT prophylaxis: Subcu heparin Code Status: *Full code Family Communication: *Woke with patient's husband who was present at the time of admission.  Patient retains capacity. Disposition Plan: [place] home when improved Consults called: Dr. Grayland Ormond from nephrology Admission status: *Inpatient   Lady Deutscher MD FACP [facility] Pager [phone]  How to contact the [facility] Attending or Consulting provider [place] or covering provider during after hours [place], for this patient?  1. Check the care team in [facility] and look for a) attending/consulting [facility] provider listed and b) the [facility] team listed 2. Log into [email or web address] and use [place]'s universal password to access. If you do not have the password, please contact  the hospital operator. 3. Locate the [facility] provider you are looking for under [facility] and page to a number that you can be directly reached. 4. If you still have difficulty reaching the provider, please page the [facility] (Director on Call) for the Hospitalists listed on amion for assistance.  If 7PM-7AM, please contact night-coverage [email or web address] Password [facility]  02/05/2019, 5:13 PM

## 2019-02-05 NOTE — Progress Notes (Signed)
Responded to PIV consult. Assessed pt with [name]. Old fistula/graft in both upper arms; pt states both were occluded and no longer in use. Pt does not have appropriate vein for [name] PIV; also not appropriate for midline per protocol. Discussed with RN. RN states she will speak to MD regarding IV medications/access

## 2019-02-05 NOTE — ED Provider Notes (Addendum)
[facility] EMERGENCY DEPARTMENT Provider Note   CSN: [id number] Arrival date & time: 02/05/19  3094  History   Chief Complaint Chief Complaint  Patient presents with  . Vascular Access Problem    HPI Heather [name] is a 48 y.o. female with TIIDM, ESRD on peritoneal dialysis presenting with worsening left lower abdominal pain that is worsened when she tries to do dialysis. Patient does peritoneal dialysis at home and was last able to complete dialysis four days ago. She endorses nausea and vomiting and difficulty eating. She urinates once per day. She went to the ED for these symptoms two days ago and was treated for peritonitis with vanc/cefepime with plans to follow-up with the dialysis center today but came to the ED again due to abdominal pain.  She was also started on flagyl for bacterial vaginosis, which has been causing discharge for the past month. She did not start this yet.   HPI  Past Medical History:  Diagnosis Date  . Antiplatelet or antithrombotic long-term use: Plavix 06/30/2017  . B12 deficiency 05/10/2014  . Blood transfusion without reported diagnosis   . Chronic constipation   . CVA (cerebral vascular accident) ([place]), nonhemorrhagic, inferior right cerebellum 12/03/2017  . Cyst of left ovary 01/12/2018  . Diabetic neuropathy, painful ([place]), on low dose Gabapentin 07/13/2013  . DM (diabetes mellitus), type 2, uncontrolled, with renal complications ([place]) 07/68/0881  . DM2 (diabetes mellitus, type 2) ([place])   . Dysfunction of left eustachian tube, with pusatile tinnitus 11/24/2017  . Dyslipidemia associated with type 2 diabetes mellitus ([place]) 06/25/2016  . ESRD (end stage renal disease) on dialysis ([place])   . ESRD with anemia ([place])   . Fibromyalgia   . Gastroparesis due to DM   . GERD (gastroesophageal reflux disease)   . Hypertension associated with diabetes ([place]) 02/19/2011  . IBS (irritable bowel syndrome)   . Left-sided weakness 12/10/2017   .  [name] Leiomyoma of uterus   . Pancreatitis   . Proliferative diabetic retinopathy ([place]) 09/29/2015  . 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 Reactive depression 12/10/2017   .  [name] Right-sided low back pain without sciatica 12/08/2015  . Secondary hyperthyroidism 11/27/2013  . Steal syndrome dialysis vascular access ([place]) 02/17/2018  . Thromboembolism ([place]) 04/05/2018  . Upper airway cough syndrome, with recs to stay off ACE and take Pepcid q hs 11/15/2016    Patient Active Problem List   Diagnosis Date Noted  . Hemiplegia and hemiparesis following cerebral infarction affecting left non-dominant side ([place]) 12/29/2018  . Peritoneal dialysis catheter in place [facility]) 11/14/2018  . Pre-transplant evaluation for kidney transplant 11/07/2018  . ESRD with anemia ([place])   . ESRD (end stage renal disease) on dialysis ([place])   . Grade I diastolic dysfunction 10/26/5944  . Arteriovenous fistula ([place]) 07/21/2018  . Type 2 diabetes mellitus with end-stage renal disease ([place]) 07/21/2018  . History of cerebrovascular accident (CVA) involving cerebellum 07/21/2018  . Cyst of left ovary 01/12/2018  . Dysmenorrhea 01/12/2018  . Insomnia 01/12/2018  . Menorrhagia 01/12/2018  . Vitamin D deficiency 01/02/2018  . Bilateral lower extremity edema 01/02/2018  . Reactive depression 12/10/2017  . Arthralgia of right temporomandibular joint 10/24/2017  . Antiplatelet or antithrombotic long-term use: Plavix 06/30/2017  . Morbid obesity due to excess calories ([place]) 11/16/2016  . Upper airway cough syndrome 11/15/2016  . DM (diabetes mellitus), type 2, uncontrolled, with renal complications ([place]) 85/92/9244  . Dyslipidemia associated with type 2 diabetes mellitus ([place])  06/25/2016  . Uncontrolled type 2 diabetes mellitus with both eyes affected by proliferative retinopathy and macular edema, with long-term current use of insulin ([place]) 06/22/2016  . Uncontrolled type 2 diabetes mellitus with diabetic  polyneuropathy, with long-term current use of insulin ([place]) 03/08/2016  . Proliferative diabetic retinopathy ([place]) 09/29/2015  . Constipation 09/03/2015  . Pseudophakia of both eyes 05/30/2015  . 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 B12 deficiency 05/10/2014  . Anemia associated with chronic renal failure, [place] 04/26/2014  . CKD (chronic kidney disease) stage 5, GFR less than 15 ml/min (HCC) 04/26/2014  . Secondary hyperparathyroidism ([place]) 11/27/2013  . Posterior subcapsular cataract, bilateral 10/18/2013  . Diabetic neuropathy, painful ([place]), on low dose Gabapentin 07/13/2013  . Diabetes mellitus, type II, insulin dependent ([place]), on CGM, with end organ damage: renal, neuropathy, gastroparesis, retinopathy 02/19/2011  . Hypertension associated with diabetes ([place]) 02/19/2011  . Gastroparesis due to DM ([place]) 02/19/2011    Past Surgical History:  Procedure Laterality Date  . ARTERY REPAIR Right 02/17/2018   Procedure: EXPLORATION OF RIGHT BRACHIAL ARTERY;  Surgeon: Conrad [place], MD;  Location: [facility];  Service: Vascular;  Laterality: Right;  . ARTERY REPAIR Right 02/17/2018   Procedure: BRACHIAL ARTERY EXPLORATION AND TRHOMBECTOMY;  Surgeon: Conrad Lesage, MD;  [facility];  Service: Vascular;  Laterality: Right;  . AV FISTULA PLACEMENT Left 05/09/2017   Procedure: INSERTION OF ARTERIOVENOUS (AV) GRAFT ARM (ARTEGRAFT);  Surgeon: Conrad [place], MD;  [facility];  Service: Vascular;  Laterality: Left;  . AV FISTULA PLACEMENT Right 02/17/2018   Procedure: INSERTION OF ARTERIOVENOUS (AV) GORE-TEX GRAFT ARM RIGHT UPPER ARM;  Surgeon: Conrad [place], MD;  Location: [facility];  Service: Vascular;  Laterality: Right;  . BASCILIC VEIN TRANSPOSITION Left 08/31/2016   Procedure: BASILIC VEIN TRANSPOSITION FIRST STAGE;  Surgeon: Conrad Manzanola, MD;  Location: [facility];  Service: Vascular;  Laterality: Left;  . CENTRAL VENOUS CATHETER INSERTION Left 07/24/2018   Procedure: INSERTION CENTRAL LINE  ADULT;  Surgeon: Conrad [place], MD;  Location: [place];  Service: Vascular;  Laterality: Left;  . EYE SURGERY     secondary to diabetic retinopathy   . FOOT SURGERY Right    t"ook bone out- maybe hammer toe"  . INSERTION OF DIALYSIS CATHETER Left 07/24/2018   Procedure: INSERTION OF TUNNELED DIALYSIS CATHETER;  Surgeon: Conrad Letona, MD;  Location: [facility];  Service: Vascular;  Laterality: Left;  . IR FLUORO GUIDE CV LINE RIGHT  07/21/2018  . IR US GUIDE VASC ACCESS RIGHT  07/21/2018  . LIGATION OF ARTERIOVENOUS  FISTULA Left 05/09/2017   Procedure: LIGATION OF ARTERIOVENOUS  FISTULA;  Surgeon: Conrad Seville, MD;  Location: [facility];  Service: Vascular;  Laterality: Left;  . LOOP RECORDER INSERTION N/A 12/05/2017   Procedure: LOOP RECORDER INSERTION;  Surgeon: Evans Lance, MD;  Location: [facility] CV LAB;  Service: Cardiovascular;  Laterality: N/A;  . MYOMECTOMY    . REMOVAL OF GRAFT Right 02/17/2018   Procedure: REMOVAL OF RIGHT UPPER ARM ARTERIOVENOUS GRAFT;  Surgeon: Conrad Gordon, MD;  Location: [facility];  Service: Vascular;  Laterality: Right;  . REVISION OF ARTERIOVENOUS GORETEX GRAFT Left 07/24/2018   Procedure: REDO ARTERIOVENOUS GORETEX GRAFT;  Surgeon: Conrad DeWitt, MD;  Location: [place];  Service: Vascular;  Laterality: Left;  . TEE WITHOUT CARDIOVERSION N/A 12/05/2017   Procedure: TRANSESOPHAGEAL ECHOCARDIOGRAM (TEE);  Surgeon: Sanda Klein, MD;  Location: [facility];  Service:  Cardiovascular;  Laterality: N/A;  . UPPER EXTREMITY VENOGRAPHY Left 07/13/2018   Procedure: UPPER EXTREMITY VENOGRAPHY - Central & Left Arm;  Surgeon: Conrad , MD;  Location: [place] CV LAB;  Service: Cardiovascular;  Laterality: Left;  . UTERINE FIBROID SURGERY    . VITRECTOMY Bilateral      OB History    Gravida  2   Para  1   Term      Preterm      AB  1   Living  1     SAB  1   TAB      Ectopic      Multiple      Live Births               Home Medications    Prior  to Admission medications   Medication Sig Start Date End Date Taking? Authorizing Provider  amLODipine (NORVASC) 5 MG tablet Take 1 tablet (5 mg total) by mouth daily. 08/09/18   Briscoe Deutscher, DO  baclofen (LIORESAL) 10 MG tablet TAKE 1/2 TABLET BY MOUTH 2 (TWO) TIMES DAILY AS NEEDED FOR MUSCLE SPASMS. 01/26/19   Briscoe Deutscher, DO  carvedilol (COREG) 6.25 MG tablet Take 1 tablet (6.25 mg total) by mouth 2 (two) times daily with a meal. 08/09/18   Briscoe Deutscher, DO  gabapentin (NEURONTIN) 300 MG capsule Take 1 capsule (300 mg total) by mouth 2 (two) times daily. Patient taking differently: Take 300 mg by mouth 2 (two) times daily as needed (pain).  04/11/18   Briscoe Deutscher, DO  insulin aspart (NOVOLOG) 100 UNIT/ML injection Inject 6-8 Units into the skin 3 (three) times daily before meals. 8 UNITS WITH BREAKFAST, 6 UNITS WITH LUNCH, & 8 UNITS WITH SUPPER    [provider]  insulin degludec (TRESIBA FLEXTOUCH) 100 UNIT/ML SOPN FlexTouch Pen Inject 0.2 mLs (20 Units total) into the skin daily with lunch. Patient taking differently: Inject 28 Units into the skin daily with lunch.  07/26/18   Danford, Suann Larry, MD  lubiprostone (AMITIZA) 24 MCG capsule Take 1 capsule (24 mcg total) by mouth 2 (two) times daily with a meal. 12/05/18   Zehr, Laban Emperor, PA-C  metroNIDAZOLE (FLAGYL) 500 MG tablet Take 1 tablet (500 mg total) by mouth 2 (two) times daily for 7 days. 02/03/19 02/10/19  Bufford Lope, DO  oxyCODONE (ROXICODONE) 5 MG immediate release tablet Take 0.5 tablets (2.5 mg total) by mouth every 6 (six) hours as needed for severe pain. 12/18/18   Briscoe Deutscher, DO  predniSONE (DELTASONE) 5 MG tablet 6-5-4-3-2-1-off 12/29/18   Briscoe Deutscher, DO  simvastatin (ZOCOR) 10 MG tablet Take 0.5 tablets (5 mg total) by mouth at bedtime. 1/2 po q hs - GFR 14 04/11/18   Briscoe Deutscher, DO    Family History Family History  Problem Relation Age of Onset  . Colon polyps Mother   . Diabetes Mother   .  Heart murmur Father        history essentially unknown  . Hypertension Father   . Diabetes Sister   . Kidney failure Sister        kidney transplant  . Diabetes Brother   . Retinal degeneration Brother   . Heart murmur Son   . Stroke Paternal Grandmother   . Diabetes Sister     Social History Social History   Tobacco Use  . Smoking status: Never Smoker  . Smokeless tobacco: Never Used  Substance Use Topics  . Alcohol  use: No    Alcohol/week: 0.0 standard drinks  . Drug use: No     Allergies   Ibuprofen; Dilaudid [hydromorphone hcl]; and Tramadol   Review of Systems Review of Systems  ROS negative except as noted in HPI   Physical Exam Updated Vital Signs BP (!) 174/88   Pulse 88   Temp (!) 97.5 F (36.4 C) (Oral)   Resp 19   LMP 12/13/2018   SpO2 98%   Physical Exam Constitution: mild distress, supine in bed, obese HENT: AT/[place] Eyes: eom intact, no scleral icterus  Cardio: tachycardic, regular rhythm, no m/r/g  Respiratory: non-labored breathing, CTA  Abdominal: soft, diffusely TTP but esp LLQ, +rebound tenderness, no distention, dialysis site with dressings intact, no surrounding erythema or tenderness  MSK: moving all extremities  Neuro: a&ox3, cooperative  Skin: c/d/i    ED Treatments / Results  Labs (all labs ordered are listed, but only abnormal results are displayed) Labs Reviewed  COMPREHENSIVE METABOLIC PANEL - Abnormal; Notable for the following components:      Result Value   Glucose, Bld 235 (*)    BUN 69 (*)    Creatinine, Ser 11.03 (*)    Calcium 6.9 (*)    Albumin 2.3 (*)    AST 13 (*)    Alkaline Phosphatase 129 (*)    GFR calc non Af Amer 4 (*)    GFR calc Af Amer 4 (*)    All other components within normal limits  CBC - Abnormal; Notable for the following components:   WBC 11.4 (*)    RBC 3.27 (*)    Hemoglobin 9.4 (*)    HCT 31.6 (*)    MCHC 29.7 (*)    All other components within normal limits  BODY FLUID CULTURE  GRAM  STAIN  LIPASE, BLOOD  LACTIC ACID, PLASMA  LACTIC ACID, PLASMA  BODY FLUID CELL COUNT WITH DIFFERENTIAL  VANCOMYCIN, RANDOM    EKG None  Radiology Ct Abdomen Pelvis Wo Contrast  Result Date: 02/05/2019 CLINICAL DATA:  Left lower quadrant pain for 5 days. Peritoneal dialysis patient. EXAM: CT ABDOMEN AND PELVIS WITHOUT CONTRAST TECHNIQUE: Multidetector CT imaging of the abdomen and pelvis was performed following the standard protocol without IV contrast. COMPARISON:  February 03, 2019 FINDINGS: Lower chest: There is a tiny left pleural effusion. Bibasilar atelectasis. No other abnormalities in the lower chest. Hepatobiliary: There is a focus of air in the right upper quadrant, seen on series 3, image 13 and coronal image 50, thought to be adjacent to rather than within the liver. The amount of air adjacent to the liver has decreased in the interval. No other definitive free air in the abdomen or pelvis identified. The liver and gallbladder are unremarkable on this unenhanced study. Pancreas: Unremarkable. No pancreatic ductal dilatation or surrounding inflammatory changes. Spleen: Normal in size without focal abnormality. Adrenals/Urinary Tract: Adrenal glands are normal. There is a tiny stone in the upper pole of the left kidney. No other renal stones identified. No hydronephrosis or perinephric stranding. No ureterectasis or ureteral stones. The bladder is unremarkable. Stomach/Bowel: The stomach and small bowel are normal. The colon is unremarkable. No evidence of colitis. The appendix is unremarkable. Vascular/Lymphatic: Atherosclerotic changes seen in the nonaneurysmal aorta. No adenopathy. Reproductive: Uterus is normal. The left ovary is larger than the right. There is high attenuation in the inferior left ovary on the previous study, probably a hemorrhagic cyst. This is more difficult to visualize today but still remains. Other:  The free air seen previously in the abdomen has decreased with only  a single focus seen adjacent to the liver. A small amount of perihepatic fluid may be from previous ascites. The high attenuation in the anterior abdominal fat remains, nonspecific. Adjacent loops of bowel are normal. High attenuation in the subcutaneous fat likely represents mild volume overload. No other acute abnormalities. Musculoskeletal: No acute or significant osseous findings. IMPRESSION: 1. Decreasing free air in the abdomen with only a tiny focus remaining adjacent to the liver, likely due to previous dialysis. The small amount of fluid in the abdomen is likely also due to previous dialysis. 2. Suspected hemorrhagic cyst in the left ovary. A pelvic ultrasound could better evaluate. 3. Tiny left pleural effusion. 4. Tiny nonobstructive stone in the left kidney. 5. Atherosclerotic changes in the nonaneurysmal aorta. Electronically Signed   By: Dorise Bullion III M.D   On: 02/05/2019 12:29   Ct Abdomen Pelvis Wo Contrast  Result Date: 02/03/2019 CLINICAL DATA:  Abdominal and LEFT pelvic pain for 3 days with nausea and vomiting question diverticulitis; history of pancreatitis, GERD, at type II diabetes mellitus, hypertension, stroke, end-stage renal disease on dialysis EXAM: CT ABDOMEN AND PELVIS WITHOUT CONTRAST TECHNIQUE: Multidetector CT imaging of the abdomen and pelvis was performed following the standard protocol without IV contrast. Sagittal and coronal MPR images reconstructed from axial data set. Patient drank dilute oral contrast for exam. COMPARISON:  04/14/2017 FINDINGS: Lower chest: Lung bases clear Hepatobiliary: Contracted gallbladder. No focal hepatic abnormalities. Pancreas: Normal appearance Spleen: Normal appearance Adrenals/Urinary Tract: Adrenal glands normal appearance. Kidneys small with tiny nonobstructing calculi in LEFT kidney. No renal mass, hydronephrosis or ureteral dilatation. Bladder unremarkable. Stomach/Bowel: Normal appendix. Stomach unremarkable. Questionable wall  thickening of the transverse colon versus artifact from underdistention. No definite small bowel abnormalities are identified. An area of infiltration of intra-abdominal fat in anterior LEFT mid abdomen is identified, nonspecific, approximately 4.5 x 1.5 x 3.7 cm in size, without definite wall thickening or abnormalities of the closest small bowel loops, of uncertain significance. Vascular/Lymphatic: Atherosclerotic calcifications aorta and iliac arteries without aneurysm. Small pericardial effusion is present. No adenopathy. Scattered pelvic phleboliths. Reproductive: Unremarkable uterus and ovaries. Other: Peritoneal dialysis catheter in pelvis. Minimal scattered peritoneal fluid. Tiny foci of intraperitoneal free air are identified, nonspecific, may be related to peritoneal dialysis. No definite hernia or additional inflammatory changes. Musculoskeletal: No acute osseous findings. IMPRESSION: Focus of nonspecific infiltration in the intraperitoneal fat of the anterior LEFT mid abdomen, area approximately 4.5 x 1.5 x 3.7 cm, could potentially be related to infiltration from peritoneal dialysate, focal nonspecific inflammatory process not excluded. Questionable mild wall thickening of the transverse colon versus artifact from underdistention, cannot exclude colitis. Small pericardial effusion. Foci of nonspecific free air, which could be related to peritoneal dialysis. Electronically Signed   By: Lavonia Dana M.D.   On: 02/03/2019 22:19    Procedures Procedures (including critical care time)  Medications Ordered in ED Medications  sodium chloride flush (NS) 0.9 % injection 3 mL (has no administration in time range)  metroNIDAZOLE (FLAGYL) IVPB 500 mg (has no administration in time range)  ondansetron (ZOFRAN) injection 4 mg (4 mg Intravenous Given 02/05/19 1420)  fentaNYL (SUBLIMAZE) injection 50 mcg (50 mcg Intravenous Given 02/05/19 1421)     Initial Impression / Assessment and Plan / ED Course  I  have reviewed the triage vital signs and the nursing notes.  Pertinent labs & imaging results that were available during my care  of the patient were reviewed by me and considered in my medical decision making (see chart for details).  Clinical Course as of Feb 05 1423  Mon Feb 05, 2019  1040 Patient seen in ED two days ago and treated for possible peritonitis. Will repeat CT of abdomen as her abdominal pain has been worsening. Has persistent leukocytosis. Lactic acid ordered as well. Will discuss with nephrology as she will likely need dialysis.    [JS]    Clinical Course User Index [JS] Marty Heck, DO    [phone] female with ESRD on periotoneal dialysis with ongoing abdominal pain with dialysis that has been increasing in intensity. She has been unable to complete dialysis since last Thursday due to pain. Seen in [place] ED on 2/8 and given cefepime/vanc to treat for peritonitis. Abdominal pain has only gotten worse. Additionally was diagnosed with bacterial vaginosis at the time, prescribed flagyl, which she did not take. CT shows possible hemorrhagic ovarian cyst and will work up further with pelvic US. Differential of symptoms includes hemorrhagic ovarian cyst as her symptoms are in the LLQ vs. Peritonitis.   Discussed with nephrology who recommend admission for peritoneal antibiotic treatment. Pelvic US pending. Metronidazole started IV as she is having nausea and dry heaves. Fentanyl/zofran for pain and nausea. Will discuss with triad for admission.   Final Clinical Impressions(s) / ED Diagnoses   Final diagnoses:  ESRD (end stage renal disease) on dialysis [facility])  Left lower quadrant abdominal pain  Nausea    ED Discharge Orders    None       Macrae Wiegman A, DO 02/05/19 1424    Kamarii Buren A, DO 02/05/19 1553    Tegeler, Gwenyth Allegra, MD 02/05/19 [phone]

## 2019-02-05 NOTE — ED Notes (Signed)
Unable to obtain IV access. MD made aware

## 2019-02-05 NOTE — Progress Notes (Signed)
Patient requests for SL antiemetic since no PIV access. Text paged Bodenheimer,NP. Will continue to monitor.

## 2019-02-05 NOTE — Progress Notes (Signed)
Informed by IV RN that she's unable to get a peripheral IV despite using [name], another IV RN also tried it before her. Will let MD on call aware. Porshea Janowski, Wonda Cheng, Therapist, sports

## 2019-02-05 NOTE — Consult Note (Signed)
[facility]    NEPHROLOGY CONSULTATION NOTE  PATIENT ID:  Heather [name], DOB:  03-04-1971  HPI: The patient is a 48 y.o. year old female presents with ongoing abdominal pain.  She was in [facility] emergency department on Saturday night with symptoms of peritonitis.  She was given vancomycin and cefepime IV, and discharged home.  She has not had peritoneal dialysis since last Thursday.  She did not believe she was supposed to continue peritoneal dialysis after the cefepime and vancomycin were given.  She has had consistent abdominal pain, without significant improvement or worsening.  Fluid cultures from 2 days ago are negative, but white blood cell count is greater than 100.  Renal consultation has been called for end-stage renal disease and peritonitis.  The patient is complaining of ongoing pain and discomfort.   Past Medical History:  Diagnosis Date  . Antiplatelet or antithrombotic long-term use: Plavix 06/30/2017  . B12 deficiency 05/10/2014  . Blood transfusion without reported diagnosis   . Chronic constipation   . CVA (cerebral vascular accident) ([place]), nonhemorrhagic, inferior right cerebellum 12/03/2017  . Cyst of left ovary 01/12/2018  . Diabetic neuropathy, painful ([place]), on low dose Gabapentin 07/13/2013  . DM (diabetes mellitus), type 2, uncontrolled, with renal complications ([place]) 17/51/0258  . DM2 (diabetes mellitus, type 2) ([place])   . Dysfunction of left eustachian tube, with pusatile tinnitus 11/24/2017  . Dyslipidemia associated with type 2 diabetes mellitus ([place]) 06/25/2016  . ESRD (end stage renal disease) on dialysis ([place])   . ESRD with anemia ([place])   . Fibromyalgia   . Gastroparesis due to DM   . GERD (gastroesophageal reflux disease)   . Hypertension associated with diabetes ([place]) 02/19/2011  . IBS (irritable bowel syndrome)   . Left-sided weakness 12/10/2017   .  [name] Leiomyoma of uterus   . Pancreatitis   . Proliferative diabetic retinopathy  ([place]) 09/29/2015  . 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 Reactive depression 12/10/2017   .  [name] Right-sided low back pain without sciatica 12/08/2015  . Secondary hyperthyroidism 11/27/2013  . Steal syndrome dialysis vascular access ([place]) 02/17/2018  . Thromboembolism ([place]) 04/05/2018  . Upper airway cough syndrome, with recs to stay off ACE and take Pepcid q hs 11/15/2016    Past Surgical History:  Procedure Laterality Date  . ARTERY REPAIR Right 02/17/2018   Procedure: EXPLORATION OF RIGHT BRACHIAL ARTERY;  Surgeon: Conrad Trion, MD;  Location: [place];  Service: Vascular;  Laterality: Right;  . ARTERY REPAIR Right 02/17/2018   Procedure: BRACHIAL ARTERY EXPLORATION AND TRHOMBECTOMY;  Surgeon: Conrad Gardiner, MD;  Location: [facility] OR;  Service: Vascular;  Laterality: Right;  . AV FISTULA PLACEMENT Left 05/09/2017   Procedure: INSERTION OF ARTERIOVENOUS (AV) GRAFT ARM (ARTEGRAFT);  Surgeon: Conrad Edgerton, MD;  Location: [facility] OR;  Service: Vascular;  Laterality: Left;  . AV FISTULA PLACEMENT Right 02/17/2018   Procedure: INSERTION OF ARTERIOVENOUS (AV) GORE-TEX GRAFT ARM RIGHT UPPER ARM;  Surgeon: Conrad Sanford, MD;  Location: [place];  Service: Vascular;  Laterality: Right;  . BASCILIC VEIN TRANSPOSITION Left 08/31/2016   Procedure: BASILIC VEIN TRANSPOSITION FIRST STAGE;  Surgeon: Conrad Paw[place]uck, MD;  Location: [place];  Service: Vascular;  Laterality: Left;  . CENTRAL VENOUS CATHETER INSERTION Left 07/24/2018   Procedure: INSERTION CENTRAL LINE ADULT;  Surgeon: Conrad San [place]imeon, MD;  Location: [place];  Service: Vascular;  Laterality: Left;  . EYE SURGERY     secondary  to diabetic retinopathy   . FOOT SURGERY Right    t"ook bone out- maybe hammer toe"  . INSERTION OF DIALYSIS CATHETER Left 07/24/2018   Procedure: INSERTION OF TUNNELED DIALYSIS CATHETER;  Surgeon: Conrad [place], MD;  Location: [place];  Service: Vascular;  Laterality: Left;  . IR FLUORO GUIDE CV LINE RIGHT  07/21/2018  . IR US GUIDE VASC  ACCESS RIGHT  07/21/2018  . LIGATION OF ARTERIOVENOUS  FISTULA Left 05/09/2017   Procedure: LIGATION OF ARTERIOVENOUS  FISTULA;  Surgeon: Conrad Piatt, MD;  Location: [place];  Service: Vascular;  Laterality: Left;  . LOOP RECORDER INSERTION N/A 12/05/2017   Procedure: LOOP RECORDER INSERTION;  Surgeon: Evans Lance, MD;  Location: [place] CV LAB;  Service: Cardiovascular;  Laterality: N/A;  . MYOMECTOMY    . REMOVAL OF GRAFT Right 02/17/2018   Procedure: REMOVAL OF RIGHT UPPER ARM ARTERIOVENOUS GRAFT;  Surgeon: Conrad Dorchester, MD;  Location: [place];  Service: Vascular;  Laterality: Right;  . REVISION OF ARTERIOVENOUS GORETEX GRAFT Left 07/24/2018   Procedure: REDO ARTERIOVENOUS GORETEX GRAFT;  Surgeon: Conrad Stuart, MD;  Location: [place];  Service: Vascular;  Laterality: Left;  . TEE WITHOUT CARDIOVERSION N/A 12/05/2017   Procedure: TRANSESOPHAGEAL ECHOCARDIOGRAM (TEE);  Surgeon: Sanda Klein, MD;  Location: [facility]COPY;  Service: Cardiovascular;  Laterality: N/A;  . UPPER EXTREMITY VENOGRAPHY Left 07/13/2018   Procedure: UPPER EXTREMITY VENOGRAPHY - Central & Left Arm;  Surgeon: Conrad Em[place], MD;  Location: [place] CV LAB;  Service: Cardiovascular;  Laterality: Left;  . UTERINE FIBROID SURGERY    . VITRECTOMY Bilateral     Family History  Problem Relation Age of Onset  . Colon polyps Mother   . Diabetes Mother   . Heart murmur Father        history essentially unknown  . Hypertension Father   . Diabetes Sister   . Kidney failure Sister        kidney transplant  . Diabetes Brother   . Retinal degeneration Brother   . Heart murmur Son   . Stroke Paternal Grandmother   . Diabetes Sister     Social History   Tobacco Use  . Smoking status: Never Smoker  . Smokeless tobacco: Never Used  Substance Use Topics  . Alcohol use: No    Alcohol/week: 0.0 standard drinks  . Drug use: No    REVIEW OF SYSTEMS: General: Positive weakness, no fever  head:  no headaches Eyes:   no blurred vision ENT:  no sore throat Neck:  no masses CV:  no chest pain, no orthopnea Lungs:  no shortness of breath, no cough GI:  no nausea or vomiting, no diarrhea, positive abdominal pain GU:  no dysuria or hematuria Skin:  no rashes or lesions Neuro:  no focal numbness or weakness Psych:  no depression or anxiety    PHYSICAL EXAM:  Vitals:   02/05/19 1600 02/05/19 1630  BP: (!) 146/65 (!) 167/84  Pulse: 91 88  Resp: 17   Temp:    SpO2: 97% 97%   No intake/output data recorded.   General:  AAOx3 NAD HEENT: MMM [place] AT anicteric sclera Neck:  No JVD, no adenopathy CV:  Heart RRR  Lungs:  L/S CTA bilaterally Abd:  abd SNT/ND with normal BS, diffuse tenderness to palpation GU:  Bladder non-palpable Extremities: +1 bilateral lower extremity edema  skin:  No skin rash Psych:  normal mood and affect Neuro:  no focal  deficits   CURRENT MEDICATIONS:  . baclofen  5 mg Oral BID  . carvedilol  6.25 mg Oral BID WC  . gentamicin cream  1 application Topical Daily  . heparin  5,000 Units Subcutaneous Q8H  . insulin aspart  0-15 Units Subcutaneous TID WC  . insulin aspart  6-8 Units Subcutaneous TID AC  . [START ON 02/06/2019] insulin degludec  28 Units Subcutaneous Q lunch  . lidocaine-prilocaine  1 application Topical UD  . lubiprostone  24 mcg Oral BID WC  . simvastatin  5 mg Oral QHS  . sodium chloride flush  3 mL Intravenous Once  . sodium chloride flush  3 mL Intravenous Q12H  . vancomycin  25 mg Intraperitoneal Once in dialysis     HOME MEDICATIONS:  Prior to Admission medications   Medication Sig Start Date End Date Taking? Authorizing Provider  baclofen (LIORESAL) 10 MG tablet TAKE 1/2 TABLET BY MOUTH 2 (TWO) TIMES DAILY AS NEEDED FOR MUSCLE SPASMS. 01/26/19  Yes Briscoe Deutscher, DO  carvedilol (COREG) 6.25 MG tablet Take 1 tablet (6.25 mg total) by mouth 2 (two) times daily with a meal. 08/09/18  Yes Briscoe Deutscher, DO  gabapentin (NEURONTIN) 300 MG capsule  Take 1 capsule (300 mg total) by mouth 2 (two) times daily. Patient taking differently: Take 300 mg by mouth 2 (two) times daily as needed (pain).  04/11/18  Yes Briscoe Deutscher, DO  insulin aspart (NOVOLOG) 100 UNIT/ML injection Inject 6-8 Units into the skin 3 (three) times daily before meals. 8 UNITS WITH BREAKFAST, 6 UNITS WITH LUNCH, & 8 UNITS WITH SUPPER   Yes [provider]  insulin degludec (TRESIBA FLEXTOUCH) 100 UNIT/ML SOPN FlexTouch Pen Inject 0.2 mLs (20 Units total) into the skin daily with lunch. Patient taking differently: Inject 28 Units into the skin daily with lunch.  07/26/18  Yes Danford, Suann Larry, MD  lidocaine-prilocaine (EMLA) cream Apply 1 application topically as directed. For Dialysis use 08/17/18  Yes [provider]  lubiprostone (AMITIZA) 24 MCG capsule Take 1 capsule (24 mcg total) by mouth 2 (two) times daily with a meal. Patient taking differently: Take 24 mcg by mouth 2 (two) times daily with a meal. As needed 12/05/18  Yes Zehr, Janett Billow D, PA-C  oxyCODONE (ROXICODONE) 5 MG immediate release tablet Take 0.5 tablets (2.5 mg total) by mouth every 6 (six) hours as needed for severe pain. 12/18/18  Yes Briscoe Deutscher, DO  promethazine (PHENERGAN) 25 MG tablet Take 25 mg by mouth every 12 (twelve) hours as needed for nausea. 11/24/18  Yes [provider]  simvastatin (ZOCOR) 10 MG tablet Take 0.5 tablets (5 mg total) by mouth at bedtime. 1/2 po q hs - GFR 14 04/11/18  Yes Briscoe Deutscher, DO  metroNIDAZOLE (FLAGYL) 500 MG tablet Take 1 tablet (500 mg total) by mouth 2 (two) times daily for 7 days. 02/03/19 02/10/19  Bufford Lope, DO       LABS:  CBC Latest Ref Rng & Units 02/05/2019 02/03/2019 07/24/2018  WBC 4.0 - 10.5 K/uL 11.4(H) 11.3(H) 7.0  Hemoglobin 12.0 - 15.0 g/dL 9.4(L) 9.2(L) 9.3(L)  Hematocrit 36.0 - 46.0 % 31.6(L) 30.0(L) 31.8(L)  Platelets 150 - 400 K/uL 277 273 169    CMP Latest Ref Rng & Units 02/05/2019 02/03/2019 07/24/2018   Glucose 70 - 99 mg/dL 235(H) 257(H) 117(H)  BUN 6 - 20 mg/dL 69(H) 62(H) 43(H)  Creatinine 0.44 - 1.00 mg/dL 11.03(H) 10.10(H) 6.39(H)  Sodium 135 - 145 mmol/L 135  130(L) 139  Potassium 3.5 - 5.1 mmol/L 3.9 3.7 4.7  Chloride 98 - 111 mmol/L 101 95(L) 105  CO2 22 - 32 mmol/L 22 21(L) 26  Calcium 8.9 - 10.3 mg/dL 6.9(L) 7.0(L) 8.1(L)  Total Protein 6.5 - 8.1 g/dL 7.3 - -  Total Bilirubin 0.3 - 1.2 mg/dL 1.0 - -  Alkaline Phos 38 - 126 U/L 129(H) - -  AST 15 - 41 U/L 13(L) - -  ALT 0 - 44 U/L 16 - -    Lab Results  Component Value Date   PTH 103 08/01/2013   CALCIUM 6.9 (L) 02/05/2019   CAION 1.15 07/21/2018   PHOS 5.1 (H) 02/03/2019       Component Value Date/Time   COLORURINE STRAW (A) 07/11/2018 2004   APPEARANCEUR HAZY (A) 07/11/2018 2004   LABSPEC 1.008 07/11/2018 2004   PHURINE 7.0 07/11/2018 2004   GLUCOSEU 50 (A) 07/11/2018 2004   HGBUR NEGATIVE 07/11/2018 2004   BILIRUBINUR NEGATIVE 07/11/2018 2004   BILIRUBINUR Negative 07/29/2017 0802   BILIRUBINUR neg 08/08/2013   KETONESUR NEGATIVE 07/11/2018 2004   PROTEINUR >=300 (A) 07/11/2018 2004   UROBILINOGEN 0.2 07/29/2017 0802   UROBILINOGEN 0.2 09/07/2015 1230   NITRITE NEGATIVE 07/11/2018 2004   LEUKOCYTESUR NEGATIVE 07/11/2018 2004      Component Value Date/Time   HCO3 24.8 (H) 06/23/2015 1343   TCO2 22 07/21/2018 0711   ACIDBASEDEF 1.0 06/23/2015 1343   O2SAT 79.0 06/23/2015 1343       Component Value Date/Time   IRON 40 06/06/2018 1349   IRON 268 02/13/2013   TIBC 209 (L) 06/06/2018 1349   FERRITIN 531 (H) 06/06/2018 1349   IRONPCTSAT 19 06/06/2018 1349   IRONPCTSAT 7 02/13/2013       ASSESSMENT/PLAN:     Problem List Items Addressed This Visit      Genitourinary   ESRD (end stage renal disease) on dialysis ([place]) - Primary    Other Visit Diagnoses    Pain       Relevant Orders   US PELVIC COMPLETE W TRANSVAGINAL AND TORSION R/O (Completed)   Left lower quadrant abdominal pain       Nausea          1.  End-stage renal disease on peritoneal dialysis.  We will resume her outpatient peritoneal dialysis prescription.  2.  PD associated peritonitis.  She clinically has peritonitis with abdominal pain and an elevated white blood cell count.  Her cultures are negative to date.  We will continue antibiotic coverage.  Vancomycin level pending.  3.  Hypertension.  Continue outpatient medications.  4.  BMD.  Continue outpatient prescription.  5.  Bacterial vaginosis.  She was diagnosed with this in the emergency department the other night.  She has not picked up her metronidazole.  Would recommend prescribing.  6.  Ovarian cyst.  This could be contributing to some of her pain.    [place], DO, [place]

## 2019-02-05 NOTE — ED Triage Notes (Signed)
Pt is home dialysis pt and states she has not done dialysis since Thursday due to feeling "bad". Pt states she just hasn't felt well enough.

## 2019-02-06 ENCOUNTER — Encounter (HOSPITAL_COMMUNITY): Payer: Self-pay | Admitting: Nephrology

## 2019-02-06 DIAGNOSIS — E1143 Type 2 diabetes mellitus with diabetic autonomic (poly)neuropathy: Secondary | ICD-10-CM

## 2019-02-06 DIAGNOSIS — Z992 Dependence on renal dialysis: Secondary | ICD-10-CM

## 2019-02-06 DIAGNOSIS — N186 End stage renal disease: Secondary | ICD-10-CM

## 2019-02-06 DIAGNOSIS — N83202 Unspecified ovarian cyst, left side: Secondary | ICD-10-CM

## 2019-02-06 DIAGNOSIS — K3184 Gastroparesis: Secondary | ICD-10-CM

## 2019-02-06 LAB — CBC
HCT: 27 % — ABNORMAL LOW (ref 36.0–46.0)
Hemoglobin: 8.1 g/dL — ABNORMAL LOW (ref 12.0–15.0)
MCH: 28.5 pg (ref 26.0–34.0)
MCHC: 30 g/dL (ref 30.0–36.0)
MCV: 95.1 fL (ref 80.0–100.0)
Platelets: 260 10*3/uL (ref 150–400)
RBC: 2.84 MIL/uL — ABNORMAL LOW (ref 3.87–5.11)
RDW: 13.7 % (ref 11.5–15.5)
WBC: 7.8 10*3/uL (ref 4.0–10.5)
nRBC: 0 % (ref 0.0–0.2)

## 2019-02-06 LAB — BASIC METABOLIC PANEL
Anion gap: 16 — ABNORMAL HIGH (ref 5–15)
BUN: 69 mg/dL — ABNORMAL HIGH (ref 6–20)
CO2: 19 mmol/L — ABNORMAL LOW (ref 22–32)
Calcium: 7.1 mg/dL — ABNORMAL LOW (ref 8.9–10.3)
Chloride: 98 mmol/L (ref 98–111)
Creatinine, Ser: 10.38 mg/dL — ABNORMAL HIGH (ref 0.44–1.00)
GFR calc Af Amer: 5 mL/min — ABNORMAL LOW (ref >60)
GFR calc non Af Amer: 4 mL/min — ABNORMAL LOW (ref >60)
Glucose, Bld: 246 mg/dL — ABNORMAL HIGH (ref 70–99)
Potassium: 4 mmol/L (ref 3.5–5.1)
Sodium: 133 mmol/L — ABNORMAL LOW (ref 135–145)

## 2019-02-06 LAB — VANCOMYCIN, RANDOM: Vancomycin Rm: 20

## 2019-02-06 LAB — GLUCOSE, CAPILLARY
GLUCOSE-CAPILLARY: 91 mg/dL (ref 70–99)
Glucose-Capillary: 188 mg/dL — ABNORMAL HIGH (ref 70–99)
Glucose-Capillary: 130 mg/dL — ABNORMAL HIGH (ref 70–99)
Glucose-Capillary: 113 mg/dL — ABNORMAL HIGH (ref 70–99)
Glucose-Capillary: 148 mg/dL — ABNORMAL HIGH (ref 70–99)

## 2019-02-06 MED ORDER — GENTAMICIN SULFATE 0.1 % EX CREA
1.0000 "application " | TOPICAL_CREAM | Freq: Every day | CUTANEOUS | Status: DC
  Administered 2019-02-07 – 2019-02-09 (×4): 1 via TOPICAL
  Filled 2019-02-06 (×2): qty 15

## 2019-02-06 MED ORDER — HYDROMORPHONE HCL 1 MG/ML IJ SOLN
1.0000 mg | INTRAMUSCULAR | Status: DC | PRN
  Administered 2019-02-06: 2 mg via INTRAVENOUS
  Administered 2019-02-06: 1 mg via INTRAVENOUS
  Filled 2019-02-06: qty 1

## 2019-02-06 MED ORDER — HYDROMORPHONE HCL 1 MG/ML IJ SOLN
2.0000 mg | INTRAMUSCULAR | Status: DC | PRN
  Filled 2019-02-06: qty 3

## 2019-02-06 MED ORDER — HEPARIN 1000 UNIT/ML FOR PERITONEAL DIALYSIS: 500.0000 [IU] | INTRAMUSCULAR | Status: DC | PRN

## 2019-02-06 MED ORDER — SENNOSIDES-DOCUSATE SODIUM 8.6-50 MG PO TABS
1.0000 | ORAL_TABLET | Freq: Two times a day (BID) | ORAL | Status: DC
  Administered 2019-02-06 – 2019-02-13 (×9): 1 via ORAL
  Filled 2019-02-06 (×15): qty 1

## 2019-02-06 MED ORDER — NALOXONE HCL 0.4 MG/ML IJ SOLN
INTRAMUSCULAR | Status: AC
  Administered 2019-02-06: 0.4 mg
  Filled 2019-02-06: qty 1

## 2019-02-06 MED ORDER — DELFLEX-LC/1.5% DEXTROSE 344 MOSM/L IP SOLN: INTRAPERITONEAL | Status: DC

## 2019-02-06 MED ORDER — POLYETHYLENE GLYCOL 3350 17 G PO PACK
17.0000 g | PACK | Freq: Every day | ORAL | Status: DC
  Administered 2019-02-06 – 2019-02-13 (×6): 17 g via ORAL
  Filled 2019-02-06 (×9): qty 1

## 2019-02-06 MED ORDER — HEPARIN 1000 UNIT/ML FOR PERITONEAL DIALYSIS
INTRAPERITONEAL | Status: DC | PRN
  Filled 2019-02-06 (×2): qty 3000

## 2019-02-06 MED ORDER — ALTEPLASE 2 MG IJ SOLR
INTRAMUSCULAR | Status: AC
  Filled 2019-02-06: qty 4

## 2019-02-06 MED ORDER — HYDROMORPHONE HCL 1 MG/ML IJ SOLN
0.5000 mg | INTRAMUSCULAR | Status: DC | PRN
  Administered 2019-02-06: 0.5 mg via INTRAVENOUS
  Filled 2019-02-06: qty 1

## 2019-02-06 MED ORDER — METRONIDAZOLE 500 MG PO TABS
500.0000 mg | ORAL_TABLET | Freq: Two times a day (BID) | ORAL | Status: DC
  Administered 2019-02-06 – 2019-02-07 (×4): 500 mg via ORAL
  Filled 2019-02-06 (×4): qty 1

## 2019-02-06 MED ORDER — ALTEPLASE 2 MG IJ SOLR
4.0000 mg | Freq: Once | INTRAMUSCULAR | Status: AC
  Administered 2019-02-06: 4 mg
  Filled 2019-02-06: qty 4

## 2019-02-06 MED ORDER — VANCOMYCIN VARIABLE DOSE PER UNSTABLE RENAL FUNCTION (PHARMACIST DOSING): Status: DC

## 2019-02-06 MED ORDER — SODIUM CHLORIDE 0.9 % IV SOLN
1.0000 g | INTRAVENOUS | Status: DC
  Administered 2019-02-06 – 2019-02-13 (×8): 1 g via INTRAVENOUS
  Filled 2019-02-06 (×8): qty 1

## 2019-02-06 MED ORDER — HEPARIN 1000 UNIT/ML FOR PERITONEAL DIALYSIS
INTRAPERITONEAL | Status: DC | PRN
  Filled 2019-02-06: qty 3000

## 2019-02-06 MED ORDER — METRONIDAZOLE 500 MG PO TABS: 500.0000 mg | ORAL_TABLET | Freq: Two times a day (BID) | ORAL | Status: DC

## 2019-02-06 MED ORDER — HEPARIN 1000 UNIT/ML FOR PERITONEAL DIALYSIS
INTRAPERITONEAL | Status: DC | PRN
  Administered 2019-02-07: 19:00:00 via INTRAPERITONEAL
  Filled 2019-02-06 (×13): qty 5000

## 2019-02-06 MED FILL — Medication: Qty: 1 | Status: AC

## 2019-02-06 NOTE — Progress Notes (Signed)
[facility] Progress Note  Subjective: 1st dwell would not drain, pt c/o abd pain, LLQ and all over.  They could not get IV in yestserday, getting periodic IM dilaudid, in a lot of pain.  Gram stain is neg for organisms.  Didn't get IV abx at all.  No fevers, wbc down today 7.8.    Vitals:   02/05/19 2136 02/06/19 0500 02/06/19 0613 02/06/19 0908  BP: 137/84  125/65 (!) 155/80  Pulse: 90  90   Resp: 17  19 18   Temp: 97.6 F (36.4 C)  98.1 F (36.7 C)   TempSrc: Oral  Oral   SpO2: 96%  100%   Weight: 105.9 kg 105.9 kg      Inpatient medications: . alteplase  4 mg Intracatheter Once  . carvedilol  6.25 mg Oral BID WC  . gentamicin cream  1 application Topical Daily  . heparin  5,000 Units Subcutaneous Q8H  . insulin aspart  0-15 Units Subcutaneous TID WC  . insulin aspart  6 Units Subcutaneous Q lunch  . insulin aspart  8 Units Subcutaneous Q breakfast  . insulin aspart  8 Units Subcutaneous Q supper  . insulin glargine  28 Units Subcutaneous Q lunch  . lidocaine-prilocaine  1 application Topical UD  . simvastatin  5 mg Oral QHS  . sodium chloride flush  3 mL Intravenous Q12H   . cefTAZidime (FORTAZ)  IV    . dialysis solution 1.5% low-MG/low-CA     baclofen, diphenhydrAMINE, gabapentin, heparin, heparin, HYDROmorphone (DILAUDID) injection, HYDROmorphone (DILAUDID) injection, lubiprostone, ondansetron, promethazine, zolpidem  Iron/TIBC/Ferritin/ %Sat    Component Value Date/Time   IRON 40 06/06/2018 1349   IRON 268 02/13/2013   TIBC 209 (L) 06/06/2018 1349   FERRITIN 531 (H) 06/06/2018 1349   IRONPCTSAT 19 06/06/2018 1349   IRONPCTSAT 7 02/13/2013    Exam: Gen alert, in pain, nontoxic, no distress No rash, cyanosis or gangrene Sclera anicteric, throat clear  No jvd or bruits Chest clear bilat to bases RRR no MRG Abd soft ntnd no mass or ascites +bs obese, PD cath no exit drainage GU defer MS no joint effusions or deformity Ext no LE or UE edema,  no wounds or ulcers Neuro is alert, Ox 3 , nf    Dialysis: CCPD  5 exchanges overnight, no day bag or pause, 99kg edw, 2500 cc dwell       Assessment: 1. Abd pain / acute PD cath related peritonitis: TNC 210 , gram stain negative. 1st peritonitis episode.  IV access issues.  Also PD cath clogged and wouldn't drain out the 2.5 L that were put in overnight.   2. ESRD on PD since 2019. No vol, lytes or solute issues.  3. DM2 w/ complications 4. HTN 5. H/o CVA 6. Anemia of CKD - Hb 8.1, follow 7. MBC of CKD  - Ca on the low side, P 5.7. cont meds    P: 1. TPA PD cath and add heparin to all PD bags thereafter. Low UF PD.  2. IV team re-consult this am asap for access, then needs IV abx 3. Pain meds ^'d 4. Will follow      [facility] 02/06/2019, 9:49 AM  Recent Labs  Lab 02/03/19 2015 02/05/19 0932 02/05/19 1628 02/06/19 0538  NA 130* 135  --  133*  K 3.7 3.9  --  4.0  CL 95* 101  --  98  CO2 21* 22  --  19*  GLUCOSE 257* 235*  --  246*  BUN 62* 69*  --  69*  CREATININE 10.10* 11.03* 10.68* 10.38*  CALCIUM 7.0* 6.9*  --  7.1*  PHOS 5.1*  --   --   --   ALBUMIN 2.9* 2.3*  --   --    Recent Labs  Lab 02/05/19 0932  AST 13*  ALT 16  ALKPHOS 129*  BILITOT 1.0  PROT 7.3   Recent Labs  Lab 02/05/19 0932 02/06/19 0538  WBC 11.4* 7.8  HGB 9.4* 8.1*  HCT 31.6* 27.0*  MCV 96.6 95.1  PLT 277 260

## 2019-02-06 NOTE — Progress Notes (Signed)
Disconnected pt from PD machine. Assessed tubing for fibrin and nothing seen, tried to drain again and saw air bubble bouncing up and down in tubing but still not draining. Pt stated this has happened before. Primary nurse Kipp Brood, RN made aware of disconnect. MD will need to assess

## 2019-02-06 NOTE — Progress Notes (Addendum)
Pharmacy Antibiotic Note  Heather [name] is a 48 y.o. female admitted on 02/05/2019 with PD peritontis.  Pharmacy has been consulted for vancomycin and ceftazidime dosing. Patient currently has no IV access, however IV team consulted to place IV. Peritoneal catheter is also clogged and will have TPA placed in the catheter. Per Nephrology note, patient will receive IV antibiotics. Vancomycin random drawn on 2/10 was 23 mcg/ml. Will repeat ASAP. Will redose vancomycin based on level.   Plan: Ceftazidime 1g IV q24h Further vancomycin doses per levels  Weight: 233 lb 7.5 oz (105.9 kg)  Temp (24hrs), Avg:98.2 F (36.8 C), Min:97.6 F (36.4 C), Max:98.8 F (37.1 C)  Recent Labs  Lab 02/03/19 2015 02/05/19 0932 02/05/19 1402 02/05/19 1628 02/05/19 1700 02/06/19 0538  WBC 11.3* 11.4*  --   --   --  7.8  CREATININE 10.10* 11.03*  --  10.68*  --  10.38*  LATICACIDVEN  --   --   --   --  1.5  --   VANCORANDOM  --   --  23  --   --   --     Estimated Creatinine Clearance: 8.5 mL/min (A) (by C-G formula based on SCr of 10.38 mg/dL (H)).    Allergies  Allergen Reactions  . Ibuprofen Other (See Comments)    CKD stage 3. Should avoid.  . Dilaudid [Hydromorphone Hcl] Itching and Other (See Comments)    Can take with Benadryl.  . Tramadol Itching   Microbiology results: 2/10 body fluid culture: NGTD   Raphel Stickles A. Levada Dy, PharmD, [place] Pager: [phone] Please utilize Amion for appropriate phone number to reach the unit pharmacist ([place])  Addendum:  Vancomycin level 20 mcg/ml. Will hold dose for now and repeat level in AM. Redose when <20 mcg/ml  Kijana Estock A. Levada Dy, PharmD, [place] Pager: [phone] Please utilize Amion for appropriate phone number to reach the unit pharmacist ([place])      02/06/2019 10:07 AM

## 2019-02-06 NOTE — Progress Notes (Addendum)
PROGRESS NOTE  Heather [name] UXN:[id number] DOB: 08-09-1971 DOA: 02/05/2019 PCP: Briscoe Deutscher, DO  HPI/Recap of past 24 hours:  She did not have iv access last night, she has an iv placed this am [name] drowsy from analgesics, but oriented and able to provide reliable history She continue have ab pain, no n/v, no sob, no fever Husband at bedside  Assessment/Plan: Principal Problem:   Peritonitis, dialysis-associated ([place]) Active Problems:   Diabetes mellitus, type II, insulin dependent ([place]), on CGM, with end organ damage: renal, neuropathy, gastroparesis, retinopathy   Hypertension associated with diabetes ([place])   Gastroparesis due to DM ([place])   Secondary hyperparathyroidism ([place])   CKD (chronic kidney disease) stage 5, GFR less than 15 ml/min ([place])   Peritoneal dialysis catheter in place [facility])   Peritonitis, dialysis-associated, initial encounter ([place])   Left ovarian cyst  Peritonitis/likely PD related -CT Ab/pel on presentation "1. Decreasing free air in the abdomen with only a tiny focus remaining adjacent to the liver, likely due to previous dialysis. The small amount of fluid in the abdomen is likely also due to previous dialysis. 2. Suspected hemorrhagic cyst in the left ovary. A pelvic ultrasound could better evaluate. 3. Tiny left pleural effusion. 4. Tiny nonobstructive stone in the left kidney. 5. Atherosclerotic changes in the nonaneurysmal aorta."  -peritoneal fluids culture no growth , gram stain negative, wbc 210 with 81% neurtophil -continue abx vanc and fortaz, wbc normalized this am, lactic acid unremarkable -pain control, nephrology input appreciated  ESRD on PD PD catheter malfunction Plan per nephrology  Insulin dependent dm2 with gastroparesis -on insulin, adjust prn  HTN Stable on currently regimen  Left ovarian cyst: This may be contributing to her pain syndrome.   Pelvic ultrasound " 2.8 cm mildly complex left ovarian cyst is  noted concerning for hemorrhagic cyst. Short-interval follow up ultrasound in 6-12 weeks is recommended, preferably during the week following the patient's normal menses. Small uterine fibroids are noted as well."   needs follow up with GYN consultation.  BV: + clue cells on vaginal prep done in the ED start flagyl   Obesity: Body mass index is 34.48 kg/m.   Poor IV access: currently she has two working PIV placed by IV team. I have discussed the need of IJ placement if she loss her IV access with family.  Code Status: full  Family Communication: patient and husband  Disposition Plan: not ready to discharge, need nephrology clearance   Consultants:  nephrology  Procedures:  PD  Antibiotics:  vanc/fortaz   Objective: BP (!) 155/80 (BP Location: Left Arm)   Pulse 95   Temp 98.8 F (37.1 C) (Oral)   Resp 18   Wt 105.9 kg   LMP 12/13/2018   SpO2 100%   BMI 34.48 kg/m   Intake/Output Summary (Last 24 hours) at 02/06/2019 1243 Last data filed at 02/06/2019 0900 Gross per 24 hour  Intake 360 ml  Output 0 ml  Net 360 ml   Filed Weights   02/05/19 1809 02/05/19 2136 02/06/19 0500  Weight: 103.6 kg 105.9 kg 105.9 kg    Exam: Patient is examined daily including today on 02/06/2019, exams remain the same as of yesterday except that has changed    General:  NAD, drowsy   Cardiovascular: RRR  Respiratory: CTABL  Abdomen: diffuse tender,  positive BS  Musculoskeletal: No Edema  Neuro: alert, oriented   Data Reviewed: Basic Metabolic Panel: Recent Labs  Lab 02/03/19 2015 02/05/19 0932 02/05/19  1628 02/06/19 0538  NA 130* 135  --  133*  K 3.7 3.9  --  4.0  CL 95* 101  --  98  CO2 21* 22  --  19*  GLUCOSE 257* 235*  --  246*  BUN 62* 69*  --  69*  CREATININE 10.10* 11.03* 10.68* 10.38*  CALCIUM 7.0* 6.9*  --  7.1*  PHOS 5.1*  --   --   --    Liver Function Tests: Recent Labs  Lab 02/03/19 2015 02/05/19 0932  AST  --  13*  ALT  --  16    ALKPHOS  --  129*  BILITOT  --  1.0  PROT  --  7.3  ALBUMIN 2.9* 2.3*   Recent Labs  Lab 02/03/19 2015 02/05/19 0932  LIPASE 59* 43   No results for input(s): AMMONIA in the last 168 hours. CBC: Recent Labs  Lab 02/03/19 2015 02/05/19 0932 02/06/19 0538  WBC 11.3* 11.4* 7.8  HGB 9.2* 9.4* 8.1*  HCT 30.0* 31.6* 27.0*  MCV 94.9 96.6 95.1  PLT 273 277 260   Cardiac Enzymes:   No results for input(s): CKTOTAL, CKMB, CKMBINDEX, TROPONINI in the last 168 hours. BNP (last 3 results) No results for input(s): BNP in the last 8760 hours.  ProBNP (last 3 results) Recent Labs    04/11/18 1421  PROBNP 65.0    CBG: Recent Labs  Lab 02/05/19 2139 02/06/19 0726 02/06/19 1125  GLUCAP 263* 188* 130*    Recent Results (from the past 240 hour(s))  Blood culture (routine x 2)     Status: None (Preliminary result)   Collection Time: 02/03/19  8:15 PM  Result Value Ref Range Status   Specimen Description   Final    BLOOD BLOOD LEFT FOREARM Performed at [facility], [address] [place], [place] [address]    Special Requests   Final    BOTTLES DRAWN AEROBIC AND ANAEROBIC Blood Culture adequate volume Performed at [facility], [address] [place], [place] [address]    Culture   Final    NO GROWTH 3 DAYS Performed at [facility], [place] [address], [place], [place] [address]    Report Status PENDING  Incomplete  Wet prep, genital     Status: Abnormal   Collection Time: 02/03/19  8:45 PM  Result Value Ref Range Status   Yeast Wet Prep HPF POC NONE SEEN NONE SEEN Final   Trich, Wet Prep NONE SEEN NONE SEEN Final   Clue Cells Wet Prep HPF POC PRESENT (A) NONE SEEN Final   WBC, Wet Prep HPF POC NONE SEEN NONE SEEN Final   Sperm NONE SEEN  Final    Comment: Performed at [facility], [address] [place], [place] [address]  Body fluid culture     Status: None (Preliminary result)   Collection Time: 02/03/19  9:10 PM  Result  Value Ref Range Status   Specimen Description   Final    PERITONEAL DIALYSATE Performed at [facility], [address] [place], [place] [address]    Special Requests   Final    NONE Performed at [facility], [address] [place], [place] [address]    Gram Stain   Final    FEW WBC PRESENT, PREDOMINANTLY PMN NO ORGANISMS SEEN    Culture   Final    NO GROWTH 3 DAYS Performed at  [facility], [place] [address] [place], [place] [address]    Report Status PENDING  Incomplete     Studies: US Pelvic Complete W Transvaginal And Torsion R/o  Result Date: 02/05/2019 CLINICAL DATA:  Left ovarian hemorrhagic cyst on CT scan. EXAM: TRANSABDOMINAL AND TRANSVAGINAL ULTRASOUND OF PELVIS DOPPLER ULTRASOUND OF OVARIES TECHNIQUE: Both transabdominal and transvaginal ultrasound examinations of the pelvis were performed. Transabdominal technique was performed for global imaging of the pelvis including uterus, ovaries, adnexal regions, and pelvic cul-de-sac. It was necessary to proceed with endovaginal exam following the transabdominal exam to visualize the ovaries. Color and duplex Doppler ultrasound was utilized to evaluate blood flow to the ovaries. COMPARISON:  CT scan of same day. FINDINGS: Uterus Measurements: 11.7 x 6.5 x 6.3 cm = volume: 252 mL. Two small fibroids are noted, with the largest measuring 1.5 cm posteriorly. Endometrium Thickness: 10 mm which is within normal limits for patient of reproductive age. No focal abnormality visualized. Right ovary Measurements: 3.1 x 2.0 x 1.3 cm = volume: 4 mL. Normal appearance/no adnexal mass. Left ovary Measurements: 4.9 x 3.4 x 3.2 cm = volume: 27 mL. Multiple cysts are noted, with the largest measuring 2.8 cm which is mildly complex and concerning for hemorrhagic cyst. Pulsed Doppler evaluation of both ovaries demonstrates normal low-resistance arterial and venous waveforms. Other findings No abnormal free fluid. IMPRESSION: 2.8  cm mildly complex left ovarian cyst is noted concerning for hemorrhagic cyst. Short-interval follow up ultrasound in 6-12 weeks is recommended, preferably during the week following the patient's normal menses. Small uterine fibroids are noted as well. Electronically Signed   By: Marijo Conception, M.D.   On: 02/05/2019 15:35    Scheduled Meds: . carvedilol  6.25 mg Oral BID WC  . gentamicin cream  1 application Topical Daily  . heparin  5,000 Units Subcutaneous Q8H  . insulin aspart  0-15 Units Subcutaneous TID WC  . insulin aspart  6 Units Subcutaneous Q lunch  . insulin aspart  8 Units Subcutaneous Q breakfast  . insulin aspart  8 Units Subcutaneous Q supper  . insulin glargine  28 Units Subcutaneous Q lunch  . lidocaine-prilocaine  1 application Topical UD  . metroNIDAZOLE  500 mg Oral Q12H  . [START ON 02/07/2019] polyethylene glycol  17 g Oral Daily  . senna-docusate  1 tablet Oral BID  . simvastatin  5 mg Oral QHS  . sodium chloride flush  3 mL Intravenous Q12H  . vancomycin variable dose per unstable renal function (pharmacist dosing)   Does not apply See admin instructions    Continuous Infusions: . cefTAZidime (FORTAZ)  IV 1 g (02/06/19 1121)  . dialysis solution 1.5% low-MG/low-CA       Time spent: 30mins I have personally reviewed and interpreted on  02/06/2019 daily labs, imagings as discussed above under date review session and assessment and plans.  I reviewed all nursing notes, pharmacy notes, consultant notes,  vitals, pertinent old records  I have discussed plan of care as described above with RN , patient and family on 02/06/2019   Florencia Reasons MD, PhD  [facility] Pager [phone]. If 7PM-7AM, please contact night-coverage at [email or web address], password [facility] 02/06/2019, 12:43 PM  LOS: 1 day

## 2019-02-06 NOTE — Progress Notes (Signed)
Patient was complaining of severe pain in the abdomen and medicated with Dilaudid 2 mg IV when patient started to look apneic and with head tilting up. Patient did not respond to verbal and told Student nurse to initiate the CODE and floor staff responded in a few seconds with chest compression done. Ambu bag was done by RT for breathing.Narcan was given twice and patient responded well and able to come around, sinus rhythm was noted and able to respond well and put on O2 per nasal cannula with 100 % O2 sat. BP last taken was 154/54.[name]   Attending MD was notified and Nephrology MD aware of patient becoming unresponsive after pain medication.  PD nurse at bedside doing manual exchange dialysis. Patient responsive to verbal and receptive to MD's explanation of reducing pain medication dose.

## 2019-02-06 NOTE — Code Documentation (Signed)
CODE BLUE NOTE  Patient Name: Heather [name]   MRN: [id number]   Date of Birth/ Sex: 31-Aug-1971 , female      Admission Date: 02/05/2019  Attending Provider: Florencia Reasons, MD  Primary Diagnosis: Peritonitis, dialysis-associated [facility])    Indication: Pt was in her usual state of health until this PM, when she was noted to be unresponsive w/ decreased RR. Code blue was subsequently called. At the time of arrival on scene, ACLS protocol was underway.    Technical Description:  - CPR performance duration:  0 minute  - Was defibrillation or cardioversion used? No   - Was external pacer placed? No  - Was patient intubated pre/post CPR? No    Medications Administered: Y = Yes; Blank = No Amiodarone    Atropine    Calcium    Epinephrine    Lidocaine    Magnesium    Norepinephrine    Phenylephrine    Sodium bicarbonate    Vasopressin      Post CPR evaluation:  - Final Status - Was patient successfully resuscitated ? Yes - What is current rhythm? Sinus rhythm - What is current hemodynamic status? Stable    Miscellaneous Information:  - Labs sent, including: non  - Primary team notified?  Yes  - Family Notified? Yes  - Additional notes/ transfer status: Responded to narcan. Patient received 2mg  IV dilaudid at 1411 and 1 mg at 1111.        Wilber Oliphant, MD  02/06/2019, 2:26 PM

## 2019-02-06 NOTE — Progress Notes (Addendum)
RN paged to reports patient went unresponsive with decreased RR after dilaudid. Code blue initiated, she received brief CPR, she is given narcan x1 and regained conscious.  Code team at bedside.  Stat ekg no acute findings, Qtc unremarkable. Will decrease dilaudid. Transfer from med surg to med tele bed.

## 2019-02-06 NOTE — Progress Notes (Signed)
Lab unable to draw blood from patient, very hard to stick . Will need a PICC line for access.

## 2019-02-06 NOTE — Progress Notes (Signed)
Responded to code blue to support staff and husband at bedside.  Patient recovered.Provided emotional and empathetic listening.  Will follow as needed.  Jaclynn Major, [place], [facility], Pager [phone]

## 2019-02-07 ENCOUNTER — Other Ambulatory Visit (HOSPITAL_COMMUNITY): Payer: Managed Care, Other (non HMO)

## 2019-02-07 ENCOUNTER — Ambulatory Visit: Payer: Managed Care, Other (non HMO) | Admitting: Family Medicine

## 2019-02-07 DIAGNOSIS — T8571XA Infection and inflammatory reaction due to peritoneal dialysis catheter, initial encounter: Secondary | ICD-10-CM

## 2019-02-07 LAB — COMPREHENSIVE METABOLIC PANEL
ALBUMIN: 2 g/dL — AB (ref 3.5–5.0)
ALT: 31 U/L (ref 0–44)
AST: 31 U/L (ref 15–41)
Alkaline Phosphatase: 106 U/L (ref 38–126)
Anion gap: 10 (ref 5–15)
BUN: 61 mg/dL — ABNORMAL HIGH (ref 6–20)
CO2: 24 mmol/L (ref 22–32)
Calcium: 6.8 mg/dL — ABNORMAL LOW (ref 8.9–10.3)
Chloride: 97 mmol/L — ABNORMAL LOW (ref 98–111)
Creatinine, Ser: 9.58 mg/dL — ABNORMAL HIGH (ref 0.44–1.00)
GFR calc Af Amer: 5 mL/min — ABNORMAL LOW (ref >60)
GFR calc non Af Amer: 4 mL/min — ABNORMAL LOW (ref >60)
Glucose, Bld: 227 mg/dL — ABNORMAL HIGH (ref 70–99)
POTASSIUM: 3.5 mmol/L (ref 3.5–5.1)
Sodium: 131 mmol/L — ABNORMAL LOW (ref 135–145)
Total Bilirubin: 0.6 mg/dL (ref 0.3–1.2)
Total Protein: 6.2 g/dL — ABNORMAL LOW (ref 6.5–8.1)

## 2019-02-07 LAB — CBC WITH DIFFERENTIAL/PLATELET
Abs Immature Granulocytes: 0.02 10*3/uL (ref 0.00–0.07)
Basophils Absolute: 0 10*3/uL (ref 0.0–0.1)
Basophils Relative: 0 %
Eosinophils Absolute: 0.2 10*3/uL (ref 0.0–0.5)
Eosinophils Relative: 2 %
HCT: 24.2 % — ABNORMAL LOW (ref 36.0–46.0)
Hemoglobin: 7.4 g/dL — ABNORMAL LOW (ref 12.0–15.0)
Immature Granulocytes: 0 %
Lymphocytes Relative: 20 %
Lymphs Abs: 1.3 10*3/uL (ref 0.7–4.0)
MCH: 29.1 pg (ref 26.0–34.0)
MCHC: 30.6 g/dL (ref 30.0–36.0)
MCV: 95.3 fL (ref 80.0–100.0)
Monocytes Absolute: 0.5 10*3/uL (ref 0.1–1.0)
Monocytes Relative: 8 %
NEUTROS PCT: 70 %
Neutro Abs: 4.4 10*3/uL (ref 1.7–7.7)
Platelets: 266 10*3/uL (ref 150–400)
RBC: 2.54 MIL/uL — ABNORMAL LOW (ref 3.87–5.11)
RDW: 13.6 % (ref 11.5–15.5)
WBC: 6.3 10*3/uL (ref 4.0–10.5)
nRBC: 0 % (ref 0.0–0.2)

## 2019-02-07 LAB — HEMOGLOBIN A1C
Hgb A1c MFr Bld: 9.9 % — ABNORMAL HIGH (ref 4.8–5.6)
Mean Plasma Glucose: 237.43 mg/dL

## 2019-02-07 LAB — GLUCOSE, CAPILLARY
GLUCOSE-CAPILLARY: 57 mg/dL — AB (ref 70–99)
Glucose-Capillary: 218 mg/dL — ABNORMAL HIGH (ref 70–99)
Glucose-Capillary: 115 mg/dL — ABNORMAL HIGH (ref 70–99)
Glucose-Capillary: 124 mg/dL — ABNORMAL HIGH (ref 70–99)
Glucose-Capillary: 75 mg/dL (ref 70–99)

## 2019-02-07 LAB — BODY FLUID CULTURE: CULTURE: NO GROWTH

## 2019-02-07 LAB — VANCOMYCIN, RANDOM: Vancomycin Rm: 17

## 2019-02-07 LAB — LACTIC ACID, PLASMA: Lactic Acid, Venous: 1.1 mmol/L (ref 0.5–1.9)

## 2019-02-07 MED ORDER — DARBEPOETIN ALFA 60 MCG/0.3ML IJ SOSY
60.0000 ug | PREFILLED_SYRINGE | INTRAMUSCULAR | Status: DC
  Administered 2019-02-07: 60 ug via SUBCUTANEOUS
  Filled 2019-02-07: qty 0.3

## 2019-02-07 MED ORDER — VANCOMYCIN HCL IN DEXTROSE 750-5 MG/150ML-% IV SOLN
750.0000 mg | Freq: Once | INTRAVENOUS | Status: AC
  Administered 2019-02-07: 750 mg via INTRAVENOUS
  Filled 2019-02-07 (×2): qty 150

## 2019-02-07 MED ORDER — TRAMADOL HCL 50 MG PO TABS
25.0000 mg | ORAL_TABLET | Freq: Four times a day (QID) | ORAL | Status: DC | PRN
  Administered 2019-02-07 – 2019-02-12 (×9): 25 mg via ORAL
  Filled 2019-02-07 (×10): qty 1

## 2019-02-07 MED ORDER — KETOROLAC TROMETHAMINE 15 MG/ML IJ SOLN
15.0000 mg | Freq: Three times a day (TID) | INTRAMUSCULAR | Status: AC | PRN
  Administered 2019-02-08: 15 mg via INTRAVENOUS
  Filled 2019-02-07: qty 1

## 2019-02-07 NOTE — Progress Notes (Addendum)
Subjective:  Abd discomfort resolving and tolerated 1041 uf with PD   Objective Vital signs in last 24 hours: Vitals:   02/06/19 2110 02/07/19 0500 02/07/19 0820 02/07/19 0929  BP: 117/63 114/66 (!) 154/79 127/70  Pulse: 91 95 99 98  Resp: 12 12 18 16   Temp: 98.4 F (36.9 C) 99.1 F (37.3 C) 98.9 F (37.2 C) 98.7 F (37.1 C)  TempSrc: Oral Oral Oral Oral  SpO2: 94% 92% 98% 94%  Weight: 108.7 kg   110 kg   Weight change: 5.053 kg  Physical Exam: General: Alert  NAD  Heart: RRR no MRG Lungs: CTA  Abdomen: soft ntnd no mass or ascites +bs obese, PD cath no exit drainage Extremities: No pedal edema  Dialysis Access: PD cath   Dialysis: CCPD  5 exchanges overnight, no day bag or pause, 99kg edw, 2500 cc dwell  Mircera 50 mg given 11/28/18  Op unit / 44 % TFS  02/01/19 and  Last hgb 9.9 op     Problem/Plan: 1. Abd pain / acute PD cath related peritonitis: TNC 210 , gram stain negative. 1st peritonitis episode.  IV access issues. Yesterday PD cath clogged and wouldn't drain out the 2.5 L that were put in overnight.  PD cath rx'd with TPA and PD overnight went well. 13 uf this am. On Fortaz / Vancomycin  BC and pd  Cultures no growth so far Pt and PD cath are doing better today, fluid grossly clear on drain this am.  Perhaps another 24 hrs of in-house IV abx and observation , should cont to improve. Will check repeat cell count in am.  2. ESRD on PD since 2019. No vol, lytes or solute issues.  3. DM2 w/ complications 4. HTN 5. H/o CVA ( noted BACLOFEN AS OP Med Contraindicated with ESRD not on in hosp) 6. 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 no binders listed , fu phos if higher start binder   Ernest Haber, PA-C [facility] [phone] 02/07/2019,1:10 PM  LOS: 2 days   Pt seen, examined and agree w A/P as above.  [facility] 02/07/2019, 1:54 PM    Labs: Basic  Metabolic Panel: Recent Labs  Lab 02/03/19 2015 02/05/19 0932 02/05/19 1628 02/06/19 0538 02/07/19 0625  NA 130* 135  --  133* 131*  K 3.7 3.9  --  4.0 3.5  CL 95* 101  --  98 97*  CO2 21* 22  --  19* 24  GLUCOSE 257* 235*  --  246* 227*  BUN 62* 69*  --  69* 61*  CREATININE 10.10* 11.03* 10.68* 10.38* 9.58*  CALCIUM 7.0* 6.9*  --  7.1* 6.8*  PHOS 5.1*  --   --   --   --    Liver Function Tests: Recent Labs  Lab 02/03/19 2015 02/05/19 0932 02/07/19 0625  AST  --  13* 31  ALT  --  16 31  ALKPHOS  --  129* 106  BILITOT  --  1.0 0.6  PROT  --  7.3 6.2*  ALBUMIN 2.9* 2.3* 2.0*   Recent Labs  Lab 02/03/19 2015 02/05/19 0932  LIPASE 59* 43   No results for input(s): AMMONIA in the last 168 hours. CBC: Recent Labs  Lab 02/03/19 2015 02/05/19 0932 02/06/19 0538 02/07/19 0625  WBC 11.3* 11.4* 7.8 6.3  NEUTROABS  --   --   --  4.4  HGB 9.2* 9.4* 8.1* 7.4*  HCT 30.0* 31.6* 27.0* 24.2*  MCV 94.9 96.6 95.1 95.3  PLT 273 277 260 266   Cardiac Enzymes: No results for input(s): CKTOTAL, CKMB, CKMBINDEX, TROPONINI in the last 168 hours. CBG: Recent Labs  Lab 02/06/19 1445 02/06/19 1621 02/06/19 2036 02/07/19 0723 02/07/19 1117  GLUCAP 91 113* 148* 218* 115*    Studies/Results: US Pelvic Complete W Transvaginal And Torsion R/o  Result Date: 02/05/2019 CLINICAL DATA:  Left ovarian hemorrhagic cyst on CT scan. EXAM: TRANSABDOMINAL AND TRANSVAGINAL ULTRASOUND OF PELVIS DOPPLER ULTRASOUND OF OVARIES TECHNIQUE: Both transabdominal and transvaginal ultrasound examinations of the pelvis were performed. Transabdominal technique was performed for global imaging of the pelvis including uterus, ovaries, adnexal regions, and pelvic cul-de-sac. It was necessary to proceed with endovaginal exam following the transabdominal exam to visualize the ovaries. Color and duplex Doppler ultrasound was utilized to evaluate blood flow to the ovaries. COMPARISON:  CT scan of same day.  FINDINGS: Uterus Measurements: 11.7 x 6.5 x 6.3 cm = volume: 252 mL. Two small fibroids are noted, with the largest measuring 1.5 cm posteriorly. Endometrium Thickness: 10 mm which is within normal limits for patient of reproductive age. No focal abnormality visualized. Right ovary Measurements: 3.1 x 2.0 x 1.3 cm = volume: 4 mL. Normal appearance/no adnexal mass. Left ovary Measurements: 4.9 x 3.4 x 3.2 cm = volume: 27 mL. Multiple cysts are noted, with the largest measuring 2.8 cm which is mildly complex and concerning for hemorrhagic cyst. Pulsed Doppler evaluation of both ovaries demonstrates normal low-resistance arterial and venous waveforms. Other findings No abnormal free fluid. IMPRESSION: 2.8 cm mildly complex left ovarian cyst is noted concerning for hemorrhagic cyst. Short-interval follow up ultrasound in 6-12 weeks is recommended, preferably during the week following the patient's normal menses. Small uterine fibroids are noted as well. Electronically Signed   By: Marijo Conception, M.D.   On: 02/05/2019 15:35   Medications: . cefTAZidime (FORTAZ)  IV Stopped (02/07/19 1133)  . dialysis solution 1.5% low-MG/low-CA    . vancomycin     . carvedilol  6.25 mg Oral BID WC  . gentamicin cream  1 application Topical Daily  . heparin  5,000 Units Subcutaneous Q8H  . insulin aspart  0-15 Units Subcutaneous TID WC  . insulin aspart  6 Units Subcutaneous Q lunch  . insulin aspart  8 Units Subcutaneous Q breakfast  . insulin aspart  8 Units Subcutaneous Q supper  . insulin glargine  28 Units Subcutaneous Q lunch  . lidocaine-prilocaine  1 application Topical UD  . metroNIDAZOLE  500 mg Oral Q12H  . polyethylene glycol  17 g Oral Daily  . senna-docusate  1 tablet Oral BID  . simvastatin  5 mg Oral QHS  . sodium chloride flush  3 mL Intravenous Q12H  . vancomycin variable dose per unstable renal function (pharmacist dosing)   Does not apply See admin instructions

## 2019-02-07 NOTE — Progress Notes (Signed)
Pharmacy Antibiotic Note  Heather [name] is a 48 y.o. female admitted on 02/05/2019 with PD peritontis.  Pharmacy has been consulted for vancomycin and ceftazidime dosing. Per Nephrology note, patient will receive IV antibiotics. Vancomycin random drawn this am on 2/12 was 17 mcg/ml. Will redose vancomycin based on level.   Plan: Vancomycin 750 mg IV x 1 Continue Ceftazidime 1g IV q24h Further vancomycin doses per levels  Weight: 239 lb 10.2 oz (108.7 kg)  Temp (24hrs), Avg:98.6 F (37 C), Min:98 F (36.7 C), Max:99.1 F (37.3 C)  Recent Labs  Lab 02/03/19 2015 02/05/19 0932  02/05/19 1628 02/05/19 1700 02/06/19 0538 02/06/19 1142 02/07/19 0625  WBC 11.3* 11.4*  --   --   --  7.8  --  6.3  CREATININE 10.10* 11.03*  --  10.68*  --  10.38*  --  9.58*  LATICACIDVEN  --   --   --   --  1.5  --   --  1.1  VANCORANDOM  --   --    < >  --   --   --  20 17   < > = values in this interval not displayed.    Estimated Creatinine Clearance: 9.3 mL/min (A) (by C-G formula based on SCr of 9.58 mg/dL (H)).    Allergies  Allergen Reactions  . Ibuprofen Other (See Comments)    CKD stage 3. Should avoid.  . Dilaudid [Hydromorphone Hcl] Itching and Other (See Comments)    Can take with Benadryl.  . Tramadol Itching   Microbiology results: 2/10 body fluid culture: NGTD   Gilberte Gorley A. Levada Dy, PharmD, [place] Pager: [phone] Please utilize Amion for appropriate phone number to reach the unit pharmacist ([place])  02/07/2019 9:36 AM

## 2019-02-07 NOTE — Progress Notes (Signed)
PROGRESS NOTE    Heather [name]  ASN:[id number] DOB: [birth year] DOA: 02/05/2019 PCP: Briscoe Deutscher, DO    Brief Narrative: 49 year old with past medical history significant for type 2 diabetes, end-stage renal disease on peritoneal dialysis since July 2019, chronic constipation, nonhemorrhagic cerebrovascular accident, proliferative diabetic retinopathy who presents complaining of worsening left lower abdominal pain.  The pain is worse when she tries to do peritoneal dialysis.  She was able to complete dialysis 4 days ago.  She also reports nausea vomiting.  She was a started on metronidazole for bacterial vaginosis.  Patient on February 11 became unresponsive after 2 mg of IV Dilaudid.  CODE BLUE was called.  Patient received CPR.  After Narcan patient regained consciousness.   Assessment & Plan:   Principal Problem:   Peritonitis, dialysis-associated ([place]) Active Problems:   Diabetes mellitus, type II, insulin dependent ([place]), on CGM, with end organ damage: renal, neuropathy, gastroparesis, retinopathy   Hypertension associated with diabetes ([place])   Gastroparesis due to DM ([place])   Secondary hyperparathyroidism ([place])   CKD (chronic kidney disease) stage 5, GFR less than 15 ml/min ([facility])   Peritoneal dialysis catheter in place [facility])   Peritonitis, dialysis-associated, initial encounter ([place])   Left ovarian cyst   1-Peritonitis likely related to PD catheter; CT abdomen on presentation did show decrease free air in the abdomen with only tiny focus remaining adjacent to the liver. Hemorrhagic cyst in the left ovary. Peritoneal fluid culture no growth today.  White blood cell 210 with 81% neutrophils. Continue with IV antibiotics. Peritoneal catheter now functioning. Discussed with nephrology plan to keep patient in the hospital for 1 more day of IV antibiotics.  At discharge they will do antibiotics intraperitoneal.  2-loss of consciousness, respiratory arrest: After 2 mg of IV  Dilaudid patient became unresponsive. CPR was a started.  After Narcan patient regained consciousness. Discontinue IV Dilaudid. Monitor on oral tramadol. Check ECHO.   End-stage renal disease on peritoneal dialysis Nephrology managing.  Insulin-dependent diabetic: Continue with insulin.  Hypertension; blood pressure stable.  Bacterial Vaginosis: Continue with Flagyl.  Left ovarian cyst; pelvic ultrasound showed 2.8 cm mildly complex left ovarian cyst.  Patient will need ultrasound in 6 to 12 weeks preferably during the week following patient's normal menses.   Anemia; check iron level  Repeat hb in am.     Estimated body mass index is 35.81 kg/m as calculated from the following:   Height as of 02/03/19: 5\' 9"  (1.753 m).   Weight as of this encounter: 110 kg.   DVT prophylaxis: Heparin Code Status: Full code Family Communication: Care discussed with patient and family who was at bedside Disposition Plan: Main in the hospital for IV antibiotics, monitor on oral pain medication.  Consultants:   Nephrology   Procedures:   Echo   Antimicrobials:  Fortaz 2-11 Vancomycin 2-11  Subjective: Report improvement of abdominal pain. [name] alert, responsive. Discussed with patient regarding stopping dilaudid. She agrees.   Objective: Vitals:   02/06/19 2110 02/07/19 0500 02/07/19 0820 02/07/19 0929  BP: 117/63 114/66 (!) 154/79 127/70  Pulse: 91 95 99 98  Resp: 12 12 18 16   Temp: 98.4 F (36.9 C) 99.1 F (37.3 C) 98.9 F (37.2 C) 98.7 F (37.1 C)  TempSrc: Oral Oral Oral Oral  SpO2: 94% 92% 98% 94%  Weight: 108.7 kg   110 kg    Intake/Output Summary (Last 24 hours) at 02/07/2019 1341 Last data filed at 02/07/2019 1300 Gross per 24 hour  Intake 800 ml  Output 0 ml  Net 800 ml   Filed Weights   02/06/19 0500 02/06/19 2110 02/07/19 0929  Weight: 105.9 kg 108.7 kg 110 kg    Examination:  General exam: Appears calm and comfortable  Respiratory system: Clear  to auscultation. Respiratory effort normal. Cardiovascular system: S1 & S2 heard, RRR. No JVD, murmurs, rubs, gallops or clicks. No pedal edema. Gastrointestinal system: Abdomen is nondistended, soft and nontender. No organomegaly or masses felt. Normal bowel sounds heard. [peritoneal catheter in place.  Central nervous system: Alert and oriented. No focal neurological deficits. Extremities: Symmetric 5 x 5 power. Skin: No rashes, lesions or ulcers Psychiatry: Judgement and insight appear normal. Mood & affect appropriate.     Data Reviewed: I have personally reviewed following labs and imaging studies  CBC: Recent Labs  Lab 02/03/19 2015 02/05/19 0932 02/06/19 0538 02/07/19 0625  WBC 11.3* 11.4* 7.8 6.3  NEUTROABS  --   --   --  4.4  HGB 9.2* 9.4* 8.1* 7.4*  HCT 30.0* 31.6* 27.0* 24.2*  MCV 94.9 96.6 95.1 95.3  PLT 273 277 260 664   Basic Metabolic Panel: Recent Labs  Lab 02/03/19 2015 02/05/19 0932 02/05/19 1628 02/06/19 0538 02/07/19 0625  NA 130* 135  --  133* 131*  K 3.7 3.9  --  4.0 3.5  CL 95* 101  --  98 97*  CO2 21* 22  --  19* 24  GLUCOSE 257* 235*  --  246* 227*  BUN 62* 69*  --  69* 61*  CREATININE 10.10* 11.03* 10.68* 10.38* 9.58*  CALCIUM 7.0* 6.9*  --  7.1* 6.8*  PHOS 5.1*  --   --   --   --    GFR: Estimated Creatinine Clearance: 9.4 mL/min (A) (by C-G formula based on SCr of 9.58 mg/dL (H)). Liver Function Tests: Recent Labs  Lab 02/03/19 2015 02/05/19 0932 02/07/19 0625  AST  --  13* 31  ALT  --  16 31  ALKPHOS  --  129* 106  BILITOT  --  1.0 0.6  PROT  --  7.3 6.2*  ALBUMIN 2.9* 2.3* 2.0*   Recent Labs  Lab 02/03/19 2015 02/05/19 0932  LIPASE 59* 43   No results for input(s): AMMONIA in the last 168 hours. Coagulation Profile: No results for input(s): INR, PROTIME in the last 168 hours. Cardiac Enzymes: No results for input(s): CKTOTAL, CKMB, CKMBINDEX, TROPONINI in the last 168 hours. BNP (last 3 results) Recent Labs     04/11/18 1421  PROBNP 65.0   HbA1C: Recent Labs    02/07/19 0625  HGBA1C 9.9*   CBG: Recent Labs  Lab 02/06/19 1445 02/06/19 1621 02/06/19 2036 02/07/19 0723 02/07/19 1117  GLUCAP 91 113* 148* 218* 115*   Lipid Profile: No results for input(s): CHOL, HDL, LDLCALC, TRIG, CHOLHDL, LDLDIRECT in the last 72 hours. Thyroid Function Tests: No results for input(s): TSH, T4TOTAL, FREET4, T3FREE, THYROIDAB in the last 72 hours. Anemia Panel: No results for input(s): VITAMINB12, FOLATE, FERRITIN, TIBC, IRON, RETICCTPCT in the last 72 hours. Sepsis Labs: Recent Labs  Lab 02/05/19 1700 02/07/19 0625  LATICACIDVEN 1.5 1.1    Recent Results (from the past 240 hour(s))  Blood culture (routine x 2)     Status: None (Preliminary result)   Collection Time: 02/03/19  8:15 PM  Result Value Ref Range Status   Specimen Description   Final    BLOOD BLOOD LEFT FOREARM Performed at [facility]  [address], [place] [place], [place] [address]    Special Requests   Final    BOTTLES DRAWN AEROBIC AND ANAEROBIC Blood Culture adequate volume Performed at [facility], [address] [place], [place] [address]    Culture   Final    NO GROWTH 4 DAYS Performed at [facility], [place] [address] [place], [place] [address]    Report Status PENDING  Incomplete  Wet prep, genital     Status: Abnormal   Collection Time: 02/03/19  8:45 PM  Result Value Ref Range Status   Yeast Wet Prep HPF POC NONE SEEN NONE SEEN Final   Trich, Wet Prep NONE SEEN NONE SEEN Final   Clue Cells Wet Prep HPF POC PRESENT (A) NONE SEEN Final   WBC, Wet Prep HPF POC NONE SEEN NONE SEEN Final   Sperm NONE SEEN  Final    Comment: Performed at [facility], [address] [place], [place] [address]  Body fluid culture     Status: None   Collection Time: 02/03/19  9:10 PM  Result Value Ref Range Status   Specimen Description   Final    PERITONEAL DIALYSATE Performed at [facility], [address] [place], [place] [address]    Special Requests   Final    NONE Performed at [facility], [address] [place], [place] [address]    Gram Stain   Final    FEW WBC PRESENT, PREDOMINANTLY PMN NO ORGANISMS SEEN    Culture   Final    NO GROWTH 3 DAYS Performed at [facility], [place] [address] [place], [place] [address]    Report Status 02/07/2019 FINAL  Final         Radiology Studies: US Pelvic Complete W Transvaginal And Torsion R/o  Result Date: 02/05/2019 CLINICAL DATA:  Left ovarian hemorrhagic cyst on CT scan. EXAM: TRANSABDOMINAL AND TRANSVAGINAL ULTRASOUND OF PELVIS DOPPLER ULTRASOUND OF OVARIES TECHNIQUE: Both transabdominal and transvaginal ultrasound examinations of the pelvis were performed. Transabdominal technique was performed for global imaging of the pelvis including uterus, ovaries, adnexal regions, and pelvic cul-de-sac. It was necessary to proceed with endovaginal exam following the transabdominal exam to visualize the ovaries. Color and duplex Doppler ultrasound was utilized to evaluate blood flow to the ovaries. COMPARISON:  CT scan of same day. FINDINGS: Uterus Measurements: 11.7 x 6.5 x 6.3 cm = volume: 252 mL. Two small fibroids are noted, with the largest measuring 1.5 cm posteriorly. Endometrium Thickness: 10 mm which is within normal limits for patient of reproductive age. No focal abnormality visualized. Right ovary Measurements: 3.1 x 2.0 x 1.3 cm = volume: 4 mL. Normal appearance/no adnexal mass. Left ovary Measurements: 4.9 x 3.4 x 3.2 cm = volume: 27 mL. Multiple cysts are noted, with the largest measuring 2.8 cm which is mildly complex and concerning for hemorrhagic cyst. Pulsed Doppler evaluation of both ovaries demonstrates normal low-resistance arterial and venous waveforms. Other findings No abnormal free fluid. IMPRESSION: 2.8 cm mildly complex left ovarian cyst is noted concerning for hemorrhagic cyst.  Short-interval follow up ultrasound in 6-12 weeks is recommended, preferably during the week following the patient's normal menses. Small uterine fibroids are noted as well. Electronically Signed   By: Marijo Conception, M.D.   On: 02/05/2019 15:35        Scheduled Meds: . carvedilol  6.25 mg Oral BID WC  .  darbepoetin (ARANESP) injection - NON-DIALYSIS  60 mcg Subcutaneous Q Wed-1800  . gentamicin cream  1 application Topical Daily  . heparin  5,000 Units Subcutaneous Q8H  . insulin aspart  0-15 Units Subcutaneous TID WC  . insulin aspart  6 Units Subcutaneous Q lunch  . insulin aspart  8 Units Subcutaneous Q breakfast  . insulin aspart  8 Units Subcutaneous Q supper  . insulin glargine  28 Units Subcutaneous Q lunch  . lidocaine-prilocaine  1 application Topical UD  . metroNIDAZOLE  500 mg Oral Q12H  . polyethylene glycol  17 g Oral Daily  . senna-docusate  1 tablet Oral BID  . simvastatin  5 mg Oral QHS  . sodium chloride flush  3 mL Intravenous Q12H  . vancomycin variable dose per unstable renal function (pharmacist dosing)   Does not apply See admin instructions   Continuous Infusions: . cefTAZidime (FORTAZ)  IV Stopped (02/07/19 1133)  . dialysis solution 1.5% low-MG/low-CA    . vancomycin       LOS: 2 days    Time spent: 35 minutes.     Elmarie Shiley, MD [facility]  02/07/2019, 1:41 PM

## 2019-02-07 NOTE — Progress Notes (Signed)
Pt. CBG 57 @ 2220. Gave apple juice and graham crackers. Rechecked @ 2235. CBG improved to 75. Will continue to monitor.

## 2019-02-08 ENCOUNTER — Inpatient Hospital Stay (HOSPITAL_COMMUNITY): Payer: Managed Care, Other (non HMO)

## 2019-02-08 DIAGNOSIS — R55 Syncope and collapse: Secondary | ICD-10-CM

## 2019-02-08 LAB — CBC
HCT: 24.1 % — ABNORMAL LOW (ref 36.0–46.0)
Hemoglobin: 7.5 g/dL — ABNORMAL LOW (ref 12.0–15.0)
MCH: 29.9 pg (ref 26.0–34.0)
MCHC: 31.1 g/dL (ref 30.0–36.0)
MCV: 96 fL (ref 80.0–100.0)
Platelets: 301 10*3/uL (ref 150–400)
RBC: 2.51 MIL/uL — ABNORMAL LOW (ref 3.87–5.11)
RDW: 13.7 % (ref 11.5–15.5)
WBC: 8.8 10*3/uL (ref 4.0–10.5)
nRBC: 0 % (ref 0.0–0.2)

## 2019-02-08 LAB — IRON AND TIBC
Iron: 120 ug/dL (ref 28–170)
Saturation Ratios: 87 % — ABNORMAL HIGH (ref 10.4–31.8)
TIBC: 139 ug/dL — ABNORMAL LOW (ref 250–450)
UIBC: 19 ug/dL

## 2019-02-08 LAB — BODY FLUID CELL COUNT WITH DIFFERENTIAL
Lymphs, Fluid: 8 %
Monocyte-Macrophage-Serous Fluid: 9 % — ABNORMAL LOW (ref 50–90)
NEUTROPHIL FLUID: 83 % — AB (ref 0–25)
Total Nucleated Cell Count, Fluid: 101 cu mm (ref 0–1000)

## 2019-02-08 LAB — GLUCOSE, CAPILLARY
Glucose-Capillary: 192 mg/dL — ABNORMAL HIGH (ref 70–99)
Glucose-Capillary: 113 mg/dL — ABNORMAL HIGH (ref 70–99)
Glucose-Capillary: 111 mg/dL — ABNORMAL HIGH (ref 70–99)
Glucose-Capillary: 140 mg/dL — ABNORMAL HIGH (ref 70–99)

## 2019-02-08 LAB — GRAM STAIN

## 2019-02-08 LAB — ECHOCARDIOGRAM COMPLETE: Weight: 3950.64 oz

## 2019-02-08 LAB — CULTURE, BLOOD (ROUTINE X 2)
Culture: NO GROWTH
Special Requests: ADEQUATE

## 2019-02-08 LAB — FERRITIN: Ferritin: 1628 ng/mL — ABNORMAL HIGH (ref 11–307)

## 2019-02-08 MED ORDER — ONDANSETRON HCL 4 MG/2ML IJ SOLN
4.0000 mg | Freq: Four times a day (QID) | INTRAMUSCULAR | Status: DC | PRN
  Administered 2019-02-08 – 2019-02-13 (×2): 4 mg via INTRAVENOUS
  Filled 2019-02-08 (×2): qty 2

## 2019-02-08 NOTE — Progress Notes (Signed)
Subjective:  Abdominal discomfort resolving ,some nausea continues  ,will dc Flagyl /on PD now and tolerating   Objective Vital signs in last 24 hours: Vitals:   02/07/19 1854 02/07/19 2213 02/08/19 0452 02/08/19 0954  BP: (!) 147/66 122/71 123/70 (!) 172/85  Pulse: 97 89 80 83  Resp: 18 20 (!) 21 18  Temp: 99.4 F (37.4 C) 97.7 F (36.5 C) 98.4 F (36.9 C) 97.9 F (36.6 C)  TempSrc: Oral Oral Oral Oral  SpO2:  92% 98% 100%  Weight: 109.2 kg 112 kg     Weight change: 1.3 kg  Physical Exam: General: Alert NAD  Heart: RRR no MRG Lungs: CTA  Abdomen: +bs obese soft ntnd  , PD cath in place  Extremities: No pedal edema  Dialysis Access: PD cath   Dialysis: [facility]  7 X /week with 5 exchanges overnight, Dwell  Time 1.5 hrs ,no day bag or pause, 99kg edw, 2500 cc dwell   Mircera 50 mg given 11/28/18  Op unit / 44 % TFS  02/01/19 and  Last hgb 9.9 op     Problem/Plan: 1. Abd pain / acute PD cath related peritonitis: Admit TNC 210 , gram stain negative. 1st peritonitis episode. 02/11  PD cath clogged and wouldn't drain out the 2.5 L that were put in overnight. PD cath rx'd with TPA and PD overnight went well. 1041 uf this am. On Fortaz / Vancomycin  BC and pd  Cultures no growth so far, Repeat am cell ct pending this am with Fliud now grossly clear with am drain Observe another 24 hr   On IV Abx   OK for dc once eating and N/V improved 2. ESRD on PD since 2019. No vol, lytes or solute issues.  3. DM2 w/ complications 4. HTN 5. H/o CVA ( noted BACLOFEN AS OP Med Contraindicated with ESRD not on in hosp) 6. Anemia of CKD - Hb 8.1>7.4 >7.5 Op unit / 44 % TFS  02/01/19 ,(last ESA given 12/03 per OP records)In hosp order for  Aranesp SQ q 60 q wed weekly / 7. 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 no binders listed , fu phos if higher start binder   Ernest Haber, PA-C [address] [phone] 02/08/2019,10:37 AM  LOS: 3 days   Pt seen, examined and agree w A/P as above.   [facility] 02/09/2019, 7:13 AM    Labs: Basic Metabolic Panel: Recent Labs  Lab 02/03/19 2015 02/05/19 0932 02/05/19 1628 02/06/19 0538 02/07/19 0625  NA 130* 135  --  133* 131*  K 3.7 3.9  --  4.0 3.5  CL 95* 101  --  98 97*  CO2 21* 22  --  19* 24  GLUCOSE 257* 235*  --  246* 227*  BUN 62* 69*  --  69* 61*  CREATININE 10.10* 11.03* 10.68* 10.38* 9.58*  CALCIUM 7.0* 6.9*  --  7.1* 6.8*  PHOS 5.1*  --   --   --   --    Liver Function Tests: Recent Labs  Lab 02/03/19 2015 02/05/19 0932 02/07/19 0625  AST  --  13* 31  ALT  --  16 31  ALKPHOS  --  129* 106  BILITOT  --  1.0 0.6  PROT  --  7.3 6.2*  ALBUMIN 2.9* 2.3* 2.0*   Recent Labs  Lab 02/03/19 2015 02/05/19 0932  LIPASE 59* 43   No results for input(s): AMMONIA in the last  168 hours. CBC: Recent Labs  Lab 02/03/19 2015 02/05/19 0932 02/06/19 0538 02/07/19 0625 02/08/19 0435  WBC 11.3* 11.4* 7.8 6.3 8.8  NEUTROABS  --   --   --  4.4  --   HGB 9.2* 9.4* 8.1* 7.4* 7.5*  HCT 30.0* 31.6* 27.0* 24.2* 24.1*  MCV 94.9 96.6 95.1 95.3 96.0  PLT 273 277 260 266 301   Cardiac Enzymes: No results for input(s): CKTOTAL, CKMB, CKMBINDEX, TROPONINI in the last 168 hours. CBG: Recent Labs  Lab 02/07/19 1117 02/07/19 1631 02/07/19 2216 02/07/19 2234 02/08/19 0722  GLUCAP 115* 124* 57* 75 192*    Studies/Results: No results found. Medications: . cefTAZidime (FORTAZ)  IV Stopped (02/07/19 1133)  . dialysis solution 1.5% low-MG/low-CA     . carvedilol  6.25 mg Oral BID WC  . darbepoetin (ARANESP) injection - NON-DIALYSIS  60 mcg Subcutaneous Q Wed-1800  . gentamicin cream  1 application Topical Daily  . heparin  5,000 Units Subcutaneous Q8H  . insulin aspart  0-15 Units Subcutaneous TID WC  . insulin aspart  6 Units Subcutaneous Q lunch  . insulin aspart  8 Units Subcutaneous Q breakfast  . insulin aspart  8 Units Subcutaneous Q supper  . insulin glargine  28 Units  Subcutaneous Q lunch  . lidocaine-prilocaine  1 application Topical UD  . polyethylene glycol  17 g Oral Daily  . senna-docusate  1 tablet Oral BID  . simvastatin  5 mg Oral QHS  . sodium chloride flush  3 mL Intravenous Q12H  . vancomycin variable dose per unstable renal function (pharmacist dosing)   Does not apply See admin instructions

## 2019-02-08 NOTE — Progress Notes (Addendum)
PT Cancellation Note  Patient Details Name: Heather [name] MRN: [id number] DOB: 1971/03/06   Cancelled Treatment:    Reason Eval/Treat Not Completed: Patient at procedure or test/unavailable. Pt is getting PD early then went to HD floor.  Will try again at another time.   Ramond Dial 02/08/2019, 11:33 AM   [name], PT MS Acute Rehab Dept. Number: [place]

## 2019-02-08 NOTE — Progress Notes (Signed)
PROGRESS NOTE    Heather [name]  HUD:[id number] DOB: [birth year] DOA: 02/05/2019 PCP: Briscoe Deutscher, DO    Brief Narrative: 48 year old with past medical history significant for type 2 diabetes, end-stage renal disease on peritoneal dialysis since July 2019, chronic constipation, nonhemorrhagic cerebrovascular accident, proliferative diabetic retinopathy who presents complaining of worsening left lower abdominal pain.  The pain is worse when she tries to do peritoneal dialysis.  She was able to complete dialysis 4 days ago.  She also reports nausea vomiting.  She was a started on metronidazole for bacterial vaginosis.  Patient on February 11 became unresponsive after 2 mg of IV Dilaudid.  CODE BLUE was called.  Patient received CPR.  After Narcan patient regained consciousness.   Assessment & Plan:   Principal Problem:   Peritonitis, dialysis-associated ([place]) Active Problems:   Diabetes mellitus, type II, insulin dependent ([place]), on CGM, with end organ damage: renal, neuropathy, gastroparesis, retinopathy   Hypertension associated with diabetes ([place])   Gastroparesis due to DM ([place])   Secondary hyperparathyroidism ([place])   CKD (chronic kidney disease) stage 5, GFR less than 15 ml/min (H[facility])   Peritoneal dialysis catheter in place [facility])   Peritonitis, dialysis-associated, initial encounter ([place])   Left ovarian cyst   1-Peritonitis likely related to PD catheter; CT abdomen on presentation did show decrease free air in the abdomen with only tiny focus remaining adjacent to the liver. Hemorrhagic cyst in the left ovary. Peritoneal fluid culture no growth today.  White blood cell 210 with 81% neutrophils. Peritoneal catheter now functioning. Continue with IV antibiotics.  At discharge they will do antibiotics intraperitoneal. Still vomiting. Change Zofran to IV.   2-Loss of consciousness, respiratory arrest: After 2 mg of IV Dilaudid patient became unresponsive. CPR was a started.   After Narcan patient regained consciousness. Discontinue IV Dilaudid. Monitor on oral tramadol. ECHO pending.    End-stage renal disease on peritoneal dialysis Nephrology managing.  Insulin-dependent diabetic: Continue with insulin.  Hypertension; blood pressure stable.  Bacterial Vaginosis: hold flagyl due to vomiting.   Left ovarian cyst; pelvic ultrasound showed 2.8 cm mildly complex left ovarian cyst.  Patient will need ultrasound in 6 to 12 weeks preferably during the week following patient's normal menses.   Anemia; will defer to nephrology transfusion./ iron.  Repeat hb in am.     Estimated body mass index is 36.46 kg/m as calculated from the following:   Height as of 02/03/19: 5\' 9"  (1.753 m).   Weight as of this encounter: 112 kg.   DVT prophylaxis: Heparin Code Status: Full code Family Communication: Care discussed with patient and family who was at bedside Disposition Plan: Main in the hospital for IV antibiotics, monitor on oral pain medication.  Consultants:   Nephrology   Procedures:   Echo   Antimicrobials:  Fortaz 2-11 Vancomycin 2-11  Subjective: Vomited overnight.  Still with intermittent abdominal pain./   Objective: Vitals:   02/07/19 1854 02/07/19 2213 02/08/19 0452 02/08/19 0954  BP: (!) 147/66 122/71 123/70 (!) 172/85  Pulse: 97 89 80 83  Resp: 18 20 (!) 21 18  Temp: 99.4 F (37.4 C) 97.7 F (36.5 C) 98.4 F (36.9 C) 97.9 F (36.6 C)  TempSrc: Oral Oral Oral Oral  SpO2:  92% 98% 100%  Weight: 109.2 kg 112 kg      Intake/Output Summary (Last 24 hours) at 02/08/2019 1348 Last data filed at 02/08/2019 1000 Gross per 24 hour  Intake 1210 ml  Output 0 ml  Net  1210 ml   Filed Weights   02/07/19 0929 02/07/19 1854 02/07/19 2213  Weight: 110 kg 109.2 kg 112 kg    Examination:  General exam: NAD Respiratory system: CTA Cardiovascular system: S 1, S 2 RRR Gastrointestinal system: BS Present, soft, nt Central nervous system:  alert, non focal.  Extremities: symmetric power.  Skin: no rashes.,    Data Reviewed: I have personally reviewed following labs and imaging studies  CBC: Recent Labs  Lab 02/03/19 2015 02/05/19 0932 02/06/19 0538 02/07/19 0625 02/08/19 0435  WBC 11.3* 11.4* 7.8 6.3 8.8  NEUTROABS  --   --   --  4.4  --   HGB 9.2* 9.4* 8.1* 7.4* 7.5*  HCT 30.0* 31.6* 27.0* 24.2* 24.1*  MCV 94.9 96.6 95.1 95.3 96.0  PLT 273 277 260 266 161   Basic Metabolic Panel: Recent Labs  Lab 02/03/19 2015 02/05/19 0932 02/05/19 1628 02/06/19 0538 02/07/19 0625  NA 130* 135  --  133* 131*  K 3.7 3.9  --  4.0 3.5  CL 95* 101  --  98 97*  CO2 21* 22  --  19* 24  GLUCOSE 257* 235*  --  246* 227*  BUN 62* 69*  --  69* 61*  CREATININE 10.10* 11.03* 10.68* 10.38* 9.58*  CALCIUM 7.0* 6.9*  --  7.1* 6.8*  PHOS 5.1*  --   --   --   --    GFR: Estimated Creatinine Clearance: 9.5 mL/min (A) (by C-G formula based on SCr of 9.58 mg/dL (H)). Liver Function Tests: Recent Labs  Lab 02/03/19 2015 02/05/19 0932 02/07/19 0625  AST  --  13* 31  ALT  --  16 31  ALKPHOS  --  129* 106  BILITOT  --  1.0 0.6  PROT  --  7.3 6.2*  ALBUMIN 2.9* 2.3* 2.0*   Recent Labs  Lab 02/03/19 2015 02/05/19 0932  LIPASE 59* 43   No results for input(s): AMMONIA in the last 168 hours. Coagulation Profile: No results for input(s): INR, PROTIME in the last 168 hours. Cardiac Enzymes: No results for input(s): CKTOTAL, CKMB, CKMBINDEX, TROPONINI in the last 168 hours. BNP (last 3 results) Recent Labs    04/11/18 1421  PROBNP 65.0   HbA1C: Recent Labs    02/07/19 0625  HGBA1C 9.9*   CBG: Recent Labs  Lab 02/07/19 1631 02/07/19 2216 02/07/19 2234 02/08/19 0722 02/08/19 1206  GLUCAP 124* 57* 75 192* 113*   Lipid Profile: No results for input(s): CHOL, HDL, LDLCALC, TRIG, CHOLHDL, LDLDIRECT in the last 72 hours. Thyroid Function Tests: No results for input(s): TSH, T4TOTAL, FREET4, T3FREE, THYROIDAB in  the last 72 hours. Anemia Panel: Recent Labs    02/08/19 0435  FERRITIN 1,628*  TIBC 139*  IRON 120   Sepsis Labs: Recent Labs  Lab 02/05/19 1700 02/07/19 0625  LATICACIDVEN 1.5 1.1    Recent Results (from the past 240 hour(s))  Blood culture (routine x 2)     Status: None   Collection Time: 02/03/19  8:15 PM  Result Value Ref Range Status   Specimen Description   Final    BLOOD BLOOD LEFT FOREARM Performed at [facility], [address] [place], [place] [address]    Special Requests   Final    BOTTLES DRAWN AEROBIC AND ANAEROBIC Blood Culture adequate volume Performed at [facility], [address], [place], [place] [address]    Culture   Final  NO GROWTH 5 DAYS Performed at [facility], [name] [address], [place], [place] [address]    Report Status 02/08/2019 FINAL  Final  Wet prep, genital     Status: Abnormal   Collection Time: 02/03/19  8:45 PM  Result Value Ref Range Status   Yeast Wet Prep HPF POC NONE SEEN NONE SEEN Final   Trich, Wet Prep NONE SEEN NONE SEEN Final   Clue Cells Wet Prep HPF POC PRESENT (A) NONE SEEN Final   WBC, Wet Prep HPF POC NONE SEEN NONE SEEN Final   Sperm NONE SEEN  Final    Comment: Performed at [facility], [address] [place], [place] [address]  Body fluid culture     Status: None   Collection Time: 02/03/19  9:10 PM  Result Value Ref Range Status   Specimen Description   Final    PERITONEAL DIALYSATE Performed at [facility], [address] [place], [place] [address]    Special Requests   Final    NONE Performed at [facility], [address] [place], [place] [address]    Gram Stain   Final    FEW WBC PRESENT, PREDOMINANTLY PMN NO ORGANISMS SEEN    Culture   Final    NO GROWTH 3 DAYS Performed at [facility], [place] [address] [place], [place] [address]    Report Status 02/07/2019 FINAL  Final         Radiology Studies: No results  found.      Scheduled Meds: . carvedilol  6.25 mg Oral BID WC  . darbepoetin (ARANESP) injection - NON-DIALYSIS  60 mcg Subcutaneous Q Wed-1800  . gentamicin cream  1 application Topical Daily  . heparin  5,000 Units Subcutaneous Q8H  . insulin aspart  0-15 Units Subcutaneous TID WC  . insulin aspart  6 Units Subcutaneous Q lunch  . insulin aspart  8 Units Subcutaneous Q breakfast  . insulin aspart  8 Units Subcutaneous Q supper  . insulin glargine  28 Units Subcutaneous Q lunch  . lidocaine-prilocaine  1 application Topical UD  . polyethylene glycol  17 g Oral Daily  . senna-docusate  1 tablet Oral BID  . simvastatin  5 mg Oral QHS  . sodium chloride flush  3 mL Intravenous Q12H  . vancomycin variable dose per unstable renal function (pharmacist dosing)   Does not apply See admin instructions   Continuous Infusions: . cefTAZidime (FORTAZ)  IV 1 g (02/08/19 1242)  . dialysis solution 1.5% low-MG/low-CA       LOS: 3 days    Time spent: 35 minutes.     Elmarie Shiley, MD [facility]  02/08/2019, 1:48 PM

## 2019-02-09 LAB — GLUCOSE, CAPILLARY
GLUCOSE-CAPILLARY: 145 mg/dL — AB (ref 70–99)
Glucose-Capillary: 117 mg/dL — ABNORMAL HIGH (ref 70–99)
Glucose-Capillary: 77 mg/dL (ref 70–99)
Glucose-Capillary: 207 mg/dL — ABNORMAL HIGH (ref 70–99)

## 2019-02-09 LAB — PATHOLOGIST SMEAR REVIEW

## 2019-02-09 LAB — VANCOMYCIN, RANDOM: Vancomycin Rm: 19

## 2019-02-09 MED ORDER — PRO-STAT SUGAR FREE PO LIQD
30.0000 mL | Freq: Two times a day (BID) | ORAL | Status: DC
  Administered 2019-02-09 – 2019-02-11 (×2): 30 mL via ORAL
  Filled 2019-02-09 (×8): qty 30

## 2019-02-09 MED ORDER — DELFLEX-LC/4.25% DEXTROSE 483 MOSM/L IP SOLN: INTRAPERITONEAL | Status: DC

## 2019-02-09 MED ORDER — VANCOMYCIN HCL 500 MG IV SOLR
500.0000 mg | Freq: Once | INTRAVENOUS | Status: AC
  Administered 2019-02-09: 500 mg via INTRAVENOUS
  Filled 2019-02-09: qty 500

## 2019-02-09 MED ORDER — DELFLEX-LC/2.5% DEXTROSE 394 MOSM/L IP SOLN: INTRAPERITONEAL | Status: DC

## 2019-02-09 NOTE — Progress Notes (Signed)
Rehab Admissions Coordinator Note:  Per PT recommendation, this patient was screened by [name] for appropriateness for an Inpatient Acute Rehab Consult.  At this time, we are recommending Inpatient Rehab consult. AC will contact MD regarding IP Rehab Consult Request.  [name] 02/09/2019, 3:11 PM  I can be reached at [phone].

## 2019-02-09 NOTE — Progress Notes (Signed)
Pharmacy Antibiotic Note  Heather [name] is a 48 y.o. female admitted on 02/05/2019 with PD peritontis.  Pharmacy has been consulted for vancomycin and ceftazidime dosing. Per Nephrology note, patient will receive IV antibiotics. Vancomycin random drawn this am on 2/14 was 19 mcg/ml. Will redose vancomycin based on level.   Plan: Vancomycin 500 mg IV x 1 Continue Ceftazidime 1g IV q24h Further vancomycin doses per levels, level in 2-3 days  Weight: 248 lb 7.3 oz (112.7 kg)  Temp (24hrs), Avg:97.9 F (36.6 C), Min:97.5 F (36.4 C), Max:98.2 F (36.8 C)  Recent Labs  Lab 02/03/19 2015 02/05/19 0932  02/05/19 1628 02/05/19 1700 02/06/19 0538  02/07/19 0625 02/08/19 0435 02/09/19 0935  WBC 11.3* 11.4*  --   --   --  7.8  --  6.3 8.8  --   CREATININE 10.10* 11.03*  --  10.68*  --  10.38*  --  9.58*  --   --   LATICACIDVEN  --   --   --   --  1.5  --   --  1.1  --   --   VANCORANDOM  --   --    < >  --   --   --    < > 17  --  19   < > = values in this interval not displayed.    Estimated Creatinine Clearance: 9.5 mL/min (A) (by C-G formula based on SCr of 9.58 mg/dL (H)).    Allergies  Allergen Reactions  . Ibuprofen Other (See Comments)    CKD stage 3. Should avoid.  . Dilaudid [Hydromorphone Hcl] Itching and Other (See Comments)    Can take with Benadryl.  . Tramadol Itching   Microbiology results: 2/10 body fluid culture: NGTD   Alanni Vader A. Levada Dy, PharmD, [place] Pager: [phone] Please utilize Amion for appropriate phone number to reach the unit pharmacist ([place])  02/09/2019 12:10 PM

## 2019-02-09 NOTE — Progress Notes (Signed)
Physical Therapy Evaluation Patient Details Name: Heather [name] MRN: [id number] DOB: [birth year] Today's Date: 02/09/2019   History of Present Illness  Patient is 48 y/o female presenting to hospital with worsening L lower abdominal pain secondary to peritonitis likely dialysis associated. Patient became unresponsive on Feb 11 and code blue was called. CPR and narcan administered and patient regained conciousness. PMH includes ESRD on PD, DMII, HTN, gastroparesis, hyperparathroidism, CKD, anemia, hx of CVA, and L ovarian cyst.   Clinical Impression  Patient admitted to hospital secondary to problems above and with deficits below. Patient required modA +2 for all mobility. Patient with significant unsteadiness ambulating short distance to chair reliant on 2 person HHA and external support to maintain balance. Given functional mobility deficits, recommending CIR to regain independence and functional mobility. Patient with good caregiver support and motivated to return to work. Patient will benefit from acute physical therapy services to maximize independence and safety with functional mobility.     Follow Up Recommendations CIR    Equipment Recommendations  None recommended by PT    Recommendations for Other Services OT consult;Rehab consult     Precautions / Restrictions Precautions Precautions: Fall Restrictions Weight Bearing Restrictions: No      Mobility  Bed Mobility Overal bed mobility: Needs Assistance Bed Mobility: Supine to Sit     Supine to sit: Mod assist;+2 for physical assistance     General bed mobility comments: Patient required modA +2 for trunk control to sit EOB. Verbal cues to reach for hand rail.   Transfers Overall transfer level: Needs assistance   Transfers: Sit to/from Stand Sit to Stand: Mod assist;+2 physical assistance         General transfer comment: Patient required modA +2 for lift assist to stand. Patient with dizziness in standing  that subsided.   Ambulation/Gait Ambulation/Gait assistance: Mod assist;+2 physical assistance Gait Distance (Feet): 2 Feet Assistive device: 2 person hand held assist Gait Pattern/deviations: Step-to pattern;Decreased step length - right;Decreased step length - left;Decreased stride length Gait velocity: decreased Gait velocity interpretation: <1.8 ft/sec, indicate of risk for recurrent falls General Gait Details: Patient required modA +2 for steadying with 2 person HHA to ambulate short distance to chair. Patient with significant unsteadiness requiring external support to maintain balance  Stairs            Wheelchair Mobility    Modified Rankin (Stroke Patients Only)       Balance Overall balance assessment: Needs assistance Sitting-balance support: No upper extremity supported Sitting balance-Leahy Scale: Good     Standing balance support: Bilateral upper extremity supported Standing balance-Leahy Scale: Poor Standing balance comment: reliant on BUE support and external assistance to maintain standing balance                             Pertinent Vitals/Pain Pain Assessment: 0-10 Pain Score: 6  Pain Location: headache Pain Descriptors / Indicators: Headache(Simultaneous filing. User may not have seen previous data.) Pain Intervention(s): Limited activity within patient's tolerance;Monitored during session    Home Living Family/patient expects to be discharged to:: Private residence Living Arrangements: Spouse/significant other Available Help at Discharge: Family;Available PRN/intermittently Type of Home: House Home Access: Stairs to enter Entrance Stairs-Rails: None Entrance Stairs-Number of Steps: 2 Home Layout: One level Home Equipment: Walker - 2 wheels;Cane - single point      Prior Function Level of Independence: Independent         Comments: still working  Hand Dominance        Extremity/Trunk Assessment   Upper Extremity  Assessment Upper Extremity Assessment: Defer to OT evaluation    Lower Extremity Assessment Lower Extremity Assessment: Generalized weakness    Cervical / Trunk Assessment Cervical / Trunk Assessment: Normal  Communication   Communication: No difficulties  Cognition Arousal/Alertness: Awake/alert Behavior During Therapy: WFL for tasks assessed/performed Overall Cognitive Status: Within Functional Limits for tasks assessed                                        General Comments      Exercises     Assessment/Plan    PT Assessment Patient needs continued PT services  PT Problem List Decreased strength;Decreased range of motion;Decreased balance;Decreased activity tolerance;Decreased mobility;Decreased knowledge of use of DME;Decreased knowledge of precautions;Pain       PT Treatment Interventions DME instruction;Gait training;Stair training;Functional mobility training;Therapeutic activities;Therapeutic exercise;Balance training;Patient/family education    PT Goals (Current goals can be found in the Care Plan section)  Acute Rehab PT Goals Patient Stated Goal: get back to work PT Goal Formulation: With patient Time For Goal Achievement: 02/23/19 Potential to Achieve Goals: Good    Frequency Min 3X/week   Barriers to discharge        Co-evaluation               AM-PAC PT "6 Clicks" Mobility  Outcome Measure Help needed turning from your back to your side while in a flat bed without using bedrails?: A Lot Help needed moving from lying on your back to sitting on the side of a flat bed without using bedrails?: A Lot Help needed moving to and from a bed to a chair (including a wheelchair)?: A Lot Help needed standing up from a chair using your arms (e.g., wheelchair or bedside chair)?: A Lot Help needed to walk in hospital room?: A Lot Help needed climbing 3-5 steps with a railing? : Total 6 Click Score: 11    End of Session Equipment Utilized  During Treatment: Gait belt Activity Tolerance: Patient tolerated treatment well Patient left: in chair;with call bell/phone within reach Nurse Communication: Mobility status PT Visit Diagnosis: Unsteadiness on feet (R26.81);Muscle weakness (generalized) (M62.81);Difficulty in walking, not elsewhere classified (R26.2)    Time: 3154-0086 PT Time Calculation (min) (ACUTE ONLY): 17 min   Charges:   PT Evaluation $PT Eval Moderate Complexity: 1 Mod          Erick Blinks, SPT  Erick Blinks 02/09/2019, 2:58 PM

## 2019-02-09 NOTE — Progress Notes (Addendum)
[facility] Progress Note   Subjective:  Seen in room.  Abd pain improving. Still nauseated -doesn't want to eat.  No PD issues overnight but feels like she still has fluid on   bjective Vitals:   02/08/19 2033 02/09/19 0320 02/09/19 0910 02/09/19 0923  BP: (!) 146/72 123/70 (!) 149/76 (!) 156/85  Pulse: 84 82 81 83  Resp: 20 19 18 20   Temp: (!) 97.5 F (36.4 C) 97.7 F (36.5 C) 98.2 F (36.8 C) 98 F (36.7 C)  TempSrc: Oral Oral Oral Oral  SpO2: 99% 97% 97% 99%  Weight: 112.1 kg  112.7 kg     Physical Exam General: WNWD female NAD  Heart: RRR Lungs: CTAB  Abdomen: obese soft NTND Extremities: No LE edema  Dialysis Access: PD cath in place    Weight change: 0.9 kg   Additional Objective Labs: Basic Metabolic Panel: Recent Labs  Lab 02/03/19 2015 02/05/19 0932 02/05/19 1628 02/06/19 0538 02/07/19 0625  NA 130* 135  --  133* 131*  K 3.7 3.9  --  4.0 3.5  CL 95* 101  --  98 97*  CO2 21* 22  --  19* 24  GLUCOSE 257* 235*  --  246* 227*  BUN 62* 69*  --  69* 61*  CREATININE 10.10* 11.03* 10.68* 10.38* 9.58*  CALCIUM 7.0* 6.9*  --  7.1* 6.8*  PHOS 5.1*  --   --   --   --    CBC: Recent Labs  Lab 02/03/19 2015 02/05/19 0932 02/06/19 0538 02/07/19 0625 02/08/19 0435  WBC 11.3* 11.4* 7.8 6.3 8.8  NEUTROABS  --   --   --  4.4  --   HGB 9.2* 9.4* 8.1* 7.4* 7.5*  HCT 30.0* 31.6* 27.0* 24.2* 24.1*  MCV 94.9 96.6 95.1 95.3 96.0  PLT 273 277 260 266 301   Blood Culture    Component Value Date/Time   SDES PERITONEAL DIALYSATE 02/08/2019 1050   SDES PERITONEAL DIALYSATE 02/08/2019 1050   SPECREQUEST NONE 02/08/2019 1050   SPECREQUEST NONE 02/08/2019 1050   CULT  02/08/2019 1050    NO GROWTH < 24 HOURS Performed at [facility], [address] [place], [place] [address]    REPTSTATUS PENDING 02/08/2019 1050   REPTSTATUS 02/08/2019 FINAL 02/08/2019 1050     Medications: . cefTAZidime (FORTAZ)  IV 1 g (02/08/19 1242)  .  dialysis solution 1.5% low-MG/low-CA     . carvedilol  6.25 mg Oral BID WC  . darbepoetin (ARANESP) injection - NON-DIALYSIS  60 mcg Subcutaneous Q Wed-1800  . gentamicin cream  1 application Topical Daily  . heparin  5,000 Units Subcutaneous Q8H  . insulin aspart  0-15 Units Subcutaneous TID WC  . insulin aspart  6 Units Subcutaneous Q lunch  . insulin aspart  8 Units Subcutaneous Q breakfast  . insulin aspart  8 Units Subcutaneous Q supper  . insulin glargine  28 Units Subcutaneous Q lunch  . lidocaine-prilocaine  1 application Topical UD  . polyethylene glycol  17 g Oral Daily  . senna-docusate  1 tablet Oral BID  . simvastatin  5 mg Oral QHS  . sodium chloride flush  3 mL Intravenous Q12H  . vancomycin variable dose per unstable renal function (pharmacist dosing)   Does not apply See admin instructions    Dialysis Orders:  [facility]  7 X /week with 5 exchanges overnight, Dwell  Time 1.5 hrs ,no day bag or pause, 99kg edw, 2500  mL dwell  Last OP Mircera 50 mg given 11/28/18    Assessment/Plan: 1. Abd pain/PD cath related peritonitis--1st peritonitis episode. On IV Fortaz/Vancomycin. Repeat cell count 2/13 > WBC 101, 83% neutophils. Fluid cx 2/13 neg to date. Fluid now grossly clear. [place] for discharge once eating and N/V improved  2. ESRD - Continue CCPD. Wts are up many kg, have been using all 1.5% bags since admit. Will use 2.5% / 4.25% sol tonight.  3. HTN/volume-  BP slightly elevated. On Coreg 6.25 bid.  4. Anemia-  Hgb trending down (hemorragic ovarian cyst noted on CT)  Aranesp 60 started 2/12.  Tsat 19% Ferritin 1628   5. Metabolic Bone Disease- No binders/VDRA. Follow labs  6. Nutrition - Prot supp for low albumin  7. Hx CVA - Has BACLOFEN AS OP Med which is contraindicated with ESRD-- not on in hosp) 8. DM. Uncontrolled. Hgb A1C 9.9% - per primary  9. L ovarian cyst - F/u US as outpatient   Lynnda Child PA-C [facility] Pager  [phone] 02/09/2019,10:10 AM  LOS: 4 days   Pt seen, examined and agree w A/P as above.  [facility] 02/09/2019, 2:18 PM

## 2019-02-09 NOTE — Progress Notes (Signed)
PROGRESS NOTE    Heather [name]  ZHG:[id number] DOB: [birth year] DOA: 02/05/2019 PCP: Briscoe Deutscher, DO    Brief Narrative: 48 year old with past medical history significant for type 2 diabetes, end-stage renal disease on peritoneal dialysis since July 2019, chronic constipation, nonhemorrhagic cerebrovascular accident, proliferative diabetic retinopathy who presents complaining of worsening left lower abdominal pain.  The pain is worse when she tries to do peritoneal dialysis.  She was able to complete dialysis 4 days ago.  She also reports nausea vomiting.  She was a started on metronidazole for bacterial vaginosis.  Patient on February 11 became unresponsive after 2 mg of IV Dilaudid.  CODE BLUE was called.  Patient received CPR.  After Narcan patient regained consciousness.   Assessment & Plan:   Principal Problem:   Peritonitis, dialysis-associated ([place]) Active Problems:   Diabetes mellitus, type II, insulin dependent ([place]), on CGM, with end organ damage: renal, neuropathy, gastroparesis, retinopathy   Hypertension associated with diabetes ([place])   Gastroparesis due to DM ([place])   Secondary hyperparathyroidism ([place])   CKD (chronic kidney disease) stage 5, GFR less than 15 ml/min (HCC)   Peritoneal dialysis catheter in place [facility])   Peritonitis, dialysis-associated, initial encounter ([place])   Left ovarian cyst   1-Peritonitis likely related to PD catheter; CT abdomen on presentation did show decrease free air in the abdomen with only tiny focus remaining adjacent to the liver. Hemorrhagic cyst in the left ovary. Peritoneal fluid culture no growth today.  White blood cell 210 with 81% neutrophils. Peritoneal catheter now functioning. Continue with IV antibiotics.  At discharge they will do antibiotics intraperitoneal. Zofran PRN Still with nausea, pain some what improved.   2-Loss of consciousness, respiratory arrest: After 2 mg of IV Dilaudid patient became  unresponsive. CPR was a started.  After Narcan patient regained consciousness. Discontinue IV Dilaudid. Monitor on oral tramadol. ECHO normal EF   End-stage renal disease on peritoneal dialysis Nephrology managing.  Insulin-dependent diabetic: Continue with insulin.  Hypertension; blood pressure stable.  Bacterial Vaginosis: hold flagyl due to vomiting.   Left ovarian cyst; pelvic ultrasound showed 2.8 cm mildly complex left ovarian cyst.  Patient will need ultrasound in 6 to 12 weeks preferably during the week following patient's normal menses.   Anemia; will defer to nephrology transfusion./ iron.  Repeat hb in am.     Estimated body mass index is 36.69 kg/m as calculated from the following:   Height as of 02/03/19: 5\' 9"  (1.753 m).   Weight as of this encounter: 112.7 kg.   DVT prophylaxis: Heparin Code Status: Full code Family Communication: Care discussed with patient and family who was at bedside Disposition Plan: Main in the hospital for IV antibiotics, monitor on oral pain medication.  Consultants:   Nephrology   Procedures:   Echo   Antimicrobials:  Fortaz 2-11 Vancomycin 2-11  Subjective: Abdominal pain improved. Still with nausea. Poor oral  intake  Objective: Vitals:   02/09/19 0910 02/09/19 0923 02/09/19 1742 02/09/19 1800  BP: (!) 149/76 (!) 156/85 (!) 153/72 (!) 155/75  Pulse: 81 83 81 82  Resp: 18 20 16 16   Temp: 98.2 F (36.8 C) 98 F (36.7 C) 98.1 F (36.7 C) 97.9 F (36.6 C)  TempSrc: Oral Oral Oral Oral  SpO2: 97% 99% 96% 97%  Weight: 112.7 kg       Intake/Output Summary (Last 24 hours) at 02/09/2019 1853 Last data filed at 02/09/2019 0912 Gross per 24 hour  Intake 12735 ml  Output  11106 ml  Net 1629 ml   Filed Weights   02/08/19 1716 02/08/19 2033 02/09/19 0910  Weight: 111.7 kg 112.1 kg 112.7 kg    Examination:  General exam: NAD Respiratory system: CTA Cardiovascular system; S 1, S 2 RRR Gastrointestinal system: BS  present, soft, mild tender Central nervous system: alert, non focal.  Extremities: Symmetric power.     Data Reviewed: I have personally reviewed following labs and imaging studies  CBC: Recent Labs  Lab 02/03/19 2015 02/05/19 0932 02/06/19 0538 02/07/19 0625 02/08/19 0435  WBC 11.3* 11.4* 7.8 6.3 8.8  NEUTROABS  --   --   --  4.4  --   HGB 9.2* 9.4* 8.1* 7.4* 7.5*  HCT 30.0* 31.6* 27.0* 24.2* 24.1*  MCV 94.9 96.6 95.1 95.3 96.0  PLT 273 277 260 266 865   Basic Metabolic Panel: Recent Labs  Lab 02/03/19 2015 02/05/19 0932 02/05/19 1628 02/06/19 0538 02/07/19 0625  NA 130* 135  --  133* 131*  K 3.7 3.9  --  4.0 3.5  CL 95* 101  --  98 97*  CO2 21* 22  --  19* 24  GLUCOSE 257* 235*  --  246* 227*  BUN 62* 69*  --  69* 61*  CREATININE 10.10* 11.03* 10.68* 10.38* 9.58*  CALCIUM 7.0* 6.9*  --  7.1* 6.8*  PHOS 5.1*  --   --   --   --    GFR: Estimated Creatinine Clearance: 9.5 mL/min (A) (by C-G formula based on SCr of 9.58 mg/dL (H)). Liver Function Tests: Recent Labs  Lab 02/03/19 2015 02/05/19 0932 02/07/19 0625  AST  --  13* 31  ALT  --  16 31  ALKPHOS  --  129* 106  BILITOT  --  1.0 0.6  PROT  --  7.3 6.2*  ALBUMIN 2.9* 2.3* 2.0*   Recent Labs  Lab 02/03/19 2015 02/05/19 0932  LIPASE 59* 43   No results for input(s): AMMONIA in the last 168 hours. Coagulation Profile: No results for input(s): INR, PROTIME in the last 168 hours. Cardiac Enzymes: No results for input(s): CKTOTAL, CKMB, CKMBINDEX, TROPONINI in the last 168 hours. BNP (last 3 results) Recent Labs    04/11/18 1421  PROBNP 65.0   HbA1C: Recent Labs    02/07/19 0625  HGBA1C 9.9*   CBG: Recent Labs  Lab 02/08/19 1643 02/08/19 2033 02/09/19 0731 02/09/19 1109 02/09/19 1646  GLUCAP 111* 140* 145* 117* 77   Lipid Profile: No results for input(s): CHOL, HDL, LDLCALC, TRIG, CHOLHDL, LDLDIRECT in the last 72 hours. Thyroid Function Tests: No results for input(s): TSH,  T4TOTAL, FREET4, T3FREE, THYROIDAB in the last 72 hours. Anemia Panel: Recent Labs    02/08/19 0435  FERRITIN 1,628*  TIBC 139*  IRON 120   Sepsis Labs: Recent Labs  Lab 02/05/19 1700 02/07/19 0625  LATICACIDVEN 1.5 1.1    Recent Results (from the past 240 hour(s))  Blood culture (routine x 2)     Status: None   Collection Time: 02/03/19  8:15 PM  Result Value Ref Range Status   Specimen Description   Final    BLOOD BLOOD LEFT FOREARM Performed at [facility], [address] [place], [place] [address]    Special Requests   Final    BOTTLES DRAWN AEROBIC AND ANAEROBIC Blood Culture adequate volume Performed at [facility], [address], [place], [place] [address]    Culture   Final  NO GROWTH 5 DAYS Performed at [facility], [place] [address] [place], [place] [address]    Report Status 02/08/2019 FINAL  Final  Wet prep, genital     Status: Abnormal   Collection Time: 02/03/19  8:45 PM  Result Value Ref Range Status   Yeast Wet Prep HPF POC NONE SEEN NONE SEEN Final   Trich, Wet Prep NONE SEEN NONE SEEN Final   Clue Cells Wet Prep HPF POC PRESENT (A) NONE SEEN Final   WBC, Wet Prep HPF POC NONE SEEN NONE SEEN Final   Sperm NONE SEEN  Final    Comment: Performed at [facility], [address] [place], [place] [address]  Body fluid culture     Status: None   Collection Time: 02/03/19  9:10 PM  Result Value Ref Range Status   Specimen Description   Final    PERITONEAL DIALYSATE Performed at [facility], [address] [place], [place] [address]    Special Requests   Final    NONE Performed at [facility], [address] [place], [place] [address]    Gram Stain   Final    FEW WBC PRESENT, PREDOMINANTLY PMN NO ORGANISMS SEEN    Culture   Final    NO GROWTH 3 DAYS Performed at [facility], [place] [address], [place], [place] [address]    Report Status 02/07/2019 FINAL  Final  Culture,  body fluid-bottle     Status: None (Preliminary result)   Collection Time: 02/08/19 10:50 AM  Result Value Ref Range Status   Specimen Description PERITONEAL DIALYSATE  Final   Special Requests NONE  Final   Culture   Final    NO GROWTH < 24 HOURS Performed at [facility], [place] [address], [place], [place] [address]    Report Status PENDING  Incomplete  Gram stain     Status: None   Collection Time: 02/08/19 10:50 AM  Result Value Ref Range Status   Specimen Description PERITONEAL DIALYSATE  Final   Special Requests NONE  Final   Gram Stain   Final    WBC PRESENT,BOTH PMN AND MONONUCLEAR NO ORGANISMS SEEN CYTOSPIN SMEAR Performed at [facility], [address] [place], [place] [address]    Report Status 02/08/2019 FINAL  Final         Radiology Studies: No results found.      Scheduled Meds: . carvedilol  6.25 mg Oral BID WC  . darbepoetin (ARANESP) injection - NON-DIALYSIS  60 mcg Subcutaneous Q Wed-1800  . feeding supplement (PRO-STAT SUGAR FREE 64)  30 mL Oral BID  . gentamicin cream  1 application Topical Daily  . heparin  5,000 Units Subcutaneous Q8H  . insulin aspart  0-15 Units Subcutaneous TID WC  . insulin aspart  6 Units Subcutaneous Q lunch  . insulin aspart  8 Units Subcutaneous Q breakfast  . insulin aspart  8 Units Subcutaneous Q supper  . insulin glargine  28 Units Subcutaneous Q lunch  . lidocaine-prilocaine  1 application Topical UD  . polyethylene glycol  17 g Oral Daily  . senna-docusate  1 tablet Oral BID  . simvastatin  5 mg Oral QHS  . sodium chloride flush  3 mL Intravenous Q12H  . vancomycin variable dose per unstable renal function (pharmacist dosing)   Does not apply See admin instructions   Continuous Infusions: . cefTAZidime (FORTAZ)  IV  1 g (02/09/19 1206)  . dialysis solution 1.5% low-MG/low-CA    . dialysis solution 2.5% low-MG/low-CA    . dialysis solution 4.25% low-MG/low-CA       LOS: 4 days    Time spent:  35 minutes.     Elmarie Shiley, MD [facility]  02/09/2019, 6:53 PM

## 2019-02-09 NOTE — Progress Notes (Signed)
Per nephrologist to use dialysis solutions of 2 bags of 2.5% and 1 bag of 4.25%  Without heparin.

## 2019-02-10 DIAGNOSIS — R269 Unspecified abnormalities of gait and mobility: Secondary | ICD-10-CM

## 2019-02-10 DIAGNOSIS — T8571XS Infection and inflammatory reaction due to peritoneal dialysis catheter, sequela: Secondary | ICD-10-CM

## 2019-02-10 LAB — CBC
HCT: 24.1 % — ABNORMAL LOW (ref 36.0–46.0)
Hemoglobin: 7.4 g/dL — ABNORMAL LOW (ref 12.0–15.0)
MCH: 29.2 pg (ref 26.0–34.0)
MCHC: 30.7 g/dL (ref 30.0–36.0)
MCV: 95.3 fL (ref 80.0–100.0)
Platelets: 331 10*3/uL (ref 150–400)
RBC: 2.53 MIL/uL — AB (ref 3.87–5.11)
RDW: 13.6 % (ref 11.5–15.5)
WBC: 8.4 10*3/uL (ref 4.0–10.5)
nRBC: 0.5 % — ABNORMAL HIGH (ref 0.0–0.2)

## 2019-02-10 LAB — BASIC METABOLIC PANEL
Anion gap: 10 (ref 5–15)
BUN: 41 mg/dL — AB (ref 6–20)
CO2: 24 mmol/L (ref 22–32)
Calcium: 7 mg/dL — ABNORMAL LOW (ref 8.9–10.3)
Chloride: 99 mmol/L (ref 98–111)
Creatinine, Ser: 7.66 mg/dL — ABNORMAL HIGH (ref 0.44–1.00)
GFR calc Af Amer: 7 mL/min — ABNORMAL LOW (ref >60)
GFR calc non Af Amer: 6 mL/min — ABNORMAL LOW (ref >60)
Glucose, Bld: 176 mg/dL — ABNORMAL HIGH (ref 70–99)
POTASSIUM: 3 mmol/L — AB (ref 3.5–5.1)
Sodium: 133 mmol/L — ABNORMAL LOW (ref 135–145)

## 2019-02-10 LAB — GLUCOSE, CAPILLARY
Glucose-Capillary: 140 mg/dL — ABNORMAL HIGH (ref 70–99)
Glucose-Capillary: 88 mg/dL (ref 70–99)
Glucose-Capillary: 67 mg/dL — ABNORMAL LOW (ref 70–99)
Glucose-Capillary: 71 mg/dL (ref 70–99)
Glucose-Capillary: 153 mg/dL — ABNORMAL HIGH (ref 70–99)

## 2019-02-10 LAB — ABO/RH: ABO/RH(D): O POS

## 2019-02-10 LAB — PREPARE RBC (CROSSMATCH)

## 2019-02-10 LAB — PHOSPHORUS: Phosphorus: 5.1 mg/dL — ABNORMAL HIGH (ref 2.5–4.6)

## 2019-02-10 MED ORDER — SODIUM CHLORIDE 0.9% IV SOLUTION
Freq: Once | INTRAVENOUS | Status: AC
  Administered 2019-02-10: 12:00:00 via INTRAVENOUS

## 2019-02-10 NOTE — Progress Notes (Signed)
PROGRESS NOTE    Heather [name]  WHQ:[id number] DOB: 10/30/1971 DOA: 02/05/2019 PCP: Briscoe Deutscher, DO    Brief Narrative: 48 year old with past medical history significant for type 2 diabetes, end-stage renal disease on peritoneal dialysis since July 2019, chronic constipation, nonhemorrhagic cerebrovascular accident, proliferative diabetic retinopathy who presents complaining of worsening left lower abdominal pain.  The pain is worse when she tries to do peritoneal dialysis.  She was able to complete dialysis 4 days ago.  She also reports nausea vomiting.  She was a started on metronidazole for bacterial vaginosis.  Patient on February 11 became unresponsive after 2 mg of IV Dilaudid.  CODE BLUE was called.  Patient received CPR.  After Narcan patient regained consciousness.   Assessment & Plan:   Principal Problem:   Peritonitis, dialysis-associated ([place]) Active Problems:   Diabetes mellitus, type II, insulin dependent ([place]), on CGM, with end organ damage: renal, neuropathy, gastroparesis, retinopathy   Hypertension associated with diabetes ([place])   Gastroparesis due to DM ([place])   Secondary hyperparathyroidism ([place])   CKD (chronic kidney disease) stage 5, GFR less than 15 ml/min (HCC)   Peritoneal dialysis catheter in place [facility])   Peritonitis, dialysis-associated, initial encounter ([place])   Left ovarian cyst   1-Peritonitis likely related to PD catheter; CT abdomen on presentation did show decrease free air in the abdomen with only tiny focus remaining adjacent to the liver. Hemorrhagic cyst in the left ovary. Peritoneal fluid culture no growth today.  White blood cell 210 with 81% neutrophils. Peritoneal catheter now functioning. Continue with IV antibiotics.  At discharge they will do antibiotics intraperitoneal. Zofran PRN Abdominal pain and nausea improved. Still with poor oral intake.   2-Loss of consciousness, respiratory arrest: After 2 mg of IV Dilaudid patient  became unresponsive. CPR was a started.  After Narcan patient regained consciousness. Discontinue IV Dilaudid. Monitor on oral tramadol. ECHO normal EF  Anemia; discussed with Dr Melvia Heaps, will proceed with transfusion today.  2 units.   End-stage renal disease on peritoneal dialysis Nephrology managing.  Insulin-dependent diabetic: Continue with insulin.  Hypertension; blood pressure stable.  Bacterial Vaginosis: hold flagyl due to vomiting.   Left ovarian cyst; pelvic ultrasound showed 2.8 cm mildly complex left ovarian cyst.  Patient will need ultrasound in 6 to 12 weeks preferably during the week following patient's normal menses.   Hypokalemia; will defer to nephrology    Estimated body mass index is 35.42 kg/m as calculated from the following:   Height as of 02/03/19: 5\' 9"  (1.753 m).   Weight as of this encounter: 108.8 kg.   DVT prophylaxis: Heparin Code Status: Full code Family Communication: Care discussed with patient and family who was at bedside Disposition Plan: poor oral intake. Cir consulted, awaiting insurance approval.   Consultants:   Nephrology   Procedures:   Echo   Antimicrobials:  Fortaz 2-11 Vancomycin 2-11  Subjective: [name] feeling better, abdominal pain improved 4/10. Nausea better. Still poor oral intake.    Objective: Vitals:   02/10/19 0650 02/10/19 0954 02/10/19 1321 02/10/19 1351  BP: (!) 143/69 (!) 165/74 136/68 (!) 145/75  Pulse: 81 85 82 82  Resp: 18 18  16   Temp: 97.9 F (36.6 C) 98.2 F (36.8 C) 98.2 F (36.8 C) 98.2 F (36.8 C)  TempSrc: Oral Oral Oral Oral  SpO2: 99% 100% 97% 98%  Weight: 108.8 kg       Intake/Output Summary (Last 24 hours) at 02/10/2019 1530 Last data filed at 02/10/2019  1336 Gross per 24 hour  Intake 363.65 ml  Output 0 ml  Net 363.65 ml   Filed Weights   02/08/19 2033 02/09/19 0910 02/10/19 0650  Weight: 112.1 kg 112.7 kg 108.8 kg    Examination:  General exam: NAD Respiratory  system: Crackles bases.  Cardiovascular system; S 1, S 2 RRR Gastrointestinal system: BS present, soft nt Central nervous system: Alert, non focal.  Extremities: Symmetric power.     Data Reviewed: I have personally reviewed following labs and imaging studies  CBC: Recent Labs  Lab 02/05/19 0932 02/06/19 0538 02/07/19 0625 02/08/19 0435 02/10/19 0646  WBC 11.4* 7.8 6.3 8.8 8.4  NEUTROABS  --   --  4.4  --   --   HGB 9.4* 8.1* 7.4* 7.5* 7.4*  HCT 31.6* 27.0* 24.2* 24.1* 24.1*  MCV 96.6 95.1 95.3 96.0 95.3  PLT 277 260 266 301 240   Basic Metabolic Panel: Recent Labs  Lab 02/03/19 2015 02/05/19 0932 02/05/19 1628 02/06/19 0538 02/07/19 0625 02/10/19 0646  NA 130* 135  --  133* 131* 133*  K 3.7 3.9  --  4.0 3.5 3.0*  CL 95* 101  --  98 97* 99  CO2 21* 22  --  19* 24 24  GLUCOSE 257* 235*  --  246* 227* 176*  BUN 62* 69*  --  69* 61* 41*  CREATININE 10.10* 11.03* 10.68* 10.38* 9.58* 7.66*  CALCIUM 7.0* 6.9*  --  7.1* 6.8* 7.0*  PHOS 5.1*  --   --   --   --  5.1*   GFR: Estimated Creatinine Clearance: 11.7 mL/min (A) (by C-G formula based on SCr of 7.66 mg/dL (H)). Liver Function Tests: Recent Labs  Lab 02/03/19 2015 02/05/19 0932 02/07/19 0625  AST  --  13* 31  ALT  --  16 31  ALKPHOS  --  129* 106  BILITOT  --  1.0 0.6  PROT  --  7.3 6.2*  ALBUMIN 2.9* 2.3* 2.0*   Recent Labs  Lab 02/03/19 2015 02/05/19 0932  LIPASE 59* 43   No results for input(s): AMMONIA in the last 168 hours. Coagulation Profile: No results for input(s): INR, PROTIME in the last 168 hours. Cardiac Enzymes: No results for input(s): CKTOTAL, CKMB, CKMBINDEX, TROPONINI in the last 168 hours. BNP (last 3 results) Recent Labs    04/11/18 1421  PROBNP 65.0   HbA1C: No results for input(s): HGBA1C in the last 72 hours. CBG: Recent Labs  Lab 02/09/19 1109 02/09/19 1646 02/09/19 2115 02/10/19 0732 02/10/19 1132  GLUCAP 117* 77 207* 140* 88   Lipid Profile: No results  for input(s): CHOL, HDL, LDLCALC, TRIG, CHOLHDL, LDLDIRECT in the last 72 hours. Thyroid Function Tests: No results for input(s): TSH, T4TOTAL, FREET4, T3FREE, THYROIDAB in the last 72 hours. Anemia Panel: Recent Labs    02/08/19 0435  FERRITIN 1,628*  TIBC 139*  IRON 120   Sepsis Labs: Recent Labs  Lab 02/05/19 1700 02/07/19 0625  LATICACIDVEN 1.5 1.1    Recent Results (from the past 240 hour(s))  Blood culture (routine x 2)     Status: None   Collection Time: 02/03/19  8:15 PM  Result Value Ref Range Status   Specimen Description   Final    BLOOD BLOOD LEFT FOREARM Performed at [facility], [address] [place], [place] [address]    Special Requests   Final    BOTTLES DRAWN AEROBIC AND ANAEROBIC Blood Culture adequate volume  Performed at [facility], [address] [place], [place] [address]    Culture   Final    NO GROWTH 5 DAYS Performed at [facility], [place] [address] [place], [place] [address]    Report Status 02/08/2019 FINAL  Final  Wet prep, genital     Status: Abnormal   Collection Time: 02/03/19  8:45 PM  Result Value Ref Range Status   Yeast Wet Prep HPF POC NONE SEEN NONE SEEN Final   Trich, Wet Prep NONE SEEN NONE SEEN Final   Clue Cells Wet Prep HPF POC PRESENT (A) NONE SEEN Final   WBC, Wet Prep HPF POC NONE SEEN NONE SEEN Final   Sperm NONE SEEN  Final    Comment: Performed at [facility], [address] [place], [place] [address]  Body fluid culture     Status: None   Collection Time: 02/03/19  9:10 PM  Result Value Ref Range Status   Specimen Description   Final    PERITONEAL DIALYSATE Performed at [facility], [address] [place], [place] [address]    Special Requests   Final    NONE Performed at [facility], [address] [place], [place] [address]    Gram Stain   Final    FEW WBC PRESENT, PREDOMINANTLY PMN NO ORGANISMS SEEN    Culture   Final    NO GROWTH 3  DAYS Performed at [facility], [place] [address] [place], [place] [address]    Report Status 02/07/2019 FINAL  Final  Culture, body fluid-bottle     Status: None (Preliminary result)   Collection Time: 02/08/19 10:50 AM  Result Value Ref Range Status   Specimen Description PERITONEAL DIALYSATE  Final   Special Requests NONE  Final   Culture   Final    NO GROWTH 2 DAYS Performed at [place] [address] [place], [place] [address]    Report Status PENDING  Incomplete  Gram stain     Status: None   Collection Time: 02/08/19 10:50 AM  Result Value Ref Range Status   Specimen Description PERITONEAL DIALYSATE  Final   Special Requests NONE  Final   Gram Stain   Final    WBC PRESENT,BOTH PMN AND MONONUCLEAR NO ORGANISMS SEEN CYTOSPIN SMEAR Performed at [facility], [address], [place], [place] [address]    Report Status 02/08/2019 FINAL  Final         Radiology Studies: No results found.      Scheduled Meds: . carvedilol  6.25 mg Oral BID WC  . darbepoetin (ARANESP) injection - NON-DIALYSIS  60 mcg Subcutaneous Q Wed-1800  . feeding supplement (PRO-STAT SUGAR FREE 64)  30 mL Oral BID  . gentamicin cream  1 application Topical Daily  . heparin  5,000 Units Subcutaneous Q8H  . insulin aspart  0-15 Units Subcutaneous TID WC  . insulin aspart  6 Units Subcutaneous Q lunch  . insulin aspart  8 Units Subcutaneous Q breakfast  . insulin aspart  8 Units Subcutaneous Q supper  . insulin glargine  28 Units Subcutaneous Q lunch  . lidocaine-prilocaine  1 application Topical UD  . polyethylene glycol  17 g Oral Daily  . senna-docusate  1 tablet Oral BID  . simvastatin  5 mg Oral QHS  . sodium chloride flush  3 mL Intravenous Q12H  . vancomycin variable dose  per unstable renal function (pharmacist dosing)   Does not apply See admin instructions   Continuous Infusions: . cefTAZidime (FORTAZ)  IV Stopped (02/10/19 1237)  . dialysis solution 2.5%  low-MG/low-CA    . dialysis solution 4.25% low-MG/low-CA       LOS: 5 days    Time spent: 35 minutes.     Elmarie Shiley, MD [facility]  02/10/2019, 3:30 PM

## 2019-02-10 NOTE — Progress Notes (Addendum)
[facility] Progress Note   Subjective:  Seen in room. No nausea/abd pain today- still not eating much No PD issues overnight. UF 3080mL   bjective Vitals:   02/09/19 2216 02/10/19 0341 02/10/19 0535 02/10/19 0650  BP: 120/64 (!) 153/78 136/69 (!) 143/69  Pulse: 79 (!) 103 84 81  Resp: 20 20 (!) 21 18  Temp: 98 F (36.7 C) 99 F (37.2 C) 97.8 F (36.6 C) 97.9 F (36.6 C)  TempSrc: Oral Oral Oral Oral  SpO2: 99% 98% 98% 99%  Weight:    108.8 kg    Physical Exam General: WNWD female NAD  Heart: RRR Lungs: CTAB  Abdomen: obese soft NTND Extremities: No LE edema  Dialysis Access: PD cath in place    Weight change: 1.8 kg   Additional Objective Labs: Basic Metabolic Panel: Recent Labs  Lab 02/03/19 2015  02/06/19 0538 02/07/19 0625 02/10/19 0646  NA 130*   < > 133* 131* 133*  K 3.7   < > 4.0 3.5 3.0*  CL 95*   < > 98 97* 99  CO2 21*   < > 19* 24 24  GLUCOSE 257*   < > 246* 227* 176*  BUN 62*   < > 69* 61* 41*  CREATININE 10.10*   < > 10.38* 9.58* 7.66*  CALCIUM 7.0*   < > 7.1* 6.8* 7.0*  PHOS 5.1*  --   --   --   --    < > = values in this interval not displayed.   CBC: Recent Labs  Lab 02/05/19 0932 02/06/19 0538 02/07/19 0625 02/08/19 0435 02/10/19 0646  WBC 11.4* 7.8 6.3 8.8 8.4  NEUTROABS  --   --  4.4  --   --   HGB 9.4* 8.1* 7.4* 7.5* 7.4*  HCT 31.6* 27.0* 24.2* 24.1* 24.1*  MCV 96.6 95.1 95.3 96.0 95.3  PLT 277 260 266 301 331   Blood Culture    Component Value Date/Time   SDES PERITONEAL DIALYSATE 02/08/2019 1050   SDES PERITONEAL DIALYSATE 02/08/2019 1050   SPECREQUEST NONE 02/08/2019 1050   SPECREQUEST NONE 02/08/2019 1050   CULT  02/08/2019 1050    NO GROWTH 2 DAYS Performed at [facility], [place] [address], [place], [place] [address]    REPTSTATUS PENDING 02/08/2019 1050   REPTSTATUS 02/08/2019 FINAL 02/08/2019 1050     Medications: . cefTAZidime (FORTAZ)  IV Stopped (02/09/19 1305)  . dialysis solution  2.5% low-MG/low-CA    . dialysis solution 4.25% low-MG/low-CA     . sodium chloride   Intravenous Once  . carvedilol  6.25 mg Oral BID WC  . darbepoetin (ARANESP) injection - NON-DIALYSIS  60 mcg Subcutaneous Q Wed-1800  . feeding supplement (PRO-STAT SUGAR FREE 64)  30 mL Oral BID  . gentamicin cream  1 application Topical Daily  . heparin  5,000 Units Subcutaneous Q8H  . insulin aspart  0-15 Units Subcutaneous TID WC  . insulin aspart  6 Units Subcutaneous Q lunch  . insulin aspart  8 Units Subcutaneous Q breakfast  . insulin aspart  8 Units Subcutaneous Q supper  . insulin glargine  28 Units Subcutaneous Q lunch  . lidocaine-prilocaine  1 application Topical UD  . polyethylene glycol  17 g Oral Daily  . senna-docusate  1 tablet Oral BID  . simvastatin  5 mg Oral QHS  . sodium chloride flush  3 mL Intravenous Q12H  . vancomycin variable dose per unstable renal function (pharmacist  dosing)   Does not apply See admin instructions    Dialysis Orders:  [facility]  7 X /week with 5 exchanges overnight, Dwell  Time 1.5 hrs ,no day bag or pause, 99kg edw, 2500 mL dwell  Last OP Mircera 50 mg given 11/28/18    Assessment/Plan: 1. Abd pain/PD cath related peritonitis--1st peritonitis episode. On IV Fortaz/Vancomycin. Repeat cell count 2/13 > WBC 101, 83% neutophils. PD fluid cx 2/13 neg to date. Fluid now grossly clear. [place] for discharge once eating and N/V improved  2. ESRD - Continue CCPD. Wts are up many kg, have been using all 1.5% bags since admit. Will use 2.5% / 4.25% sol tonight. Good UF overnight -will continue  3. HTN/volume-  BP slightly elevated. On Coreg 6.25 bid. Continue UF as tolerated  4. Anemia-  Hgb trending down (hemorraghic ovarian cyst noted on CT)  Aranesp 60 started 2/12.  Tsat 19% Ferritin 1628. Hgb 9.4>7.4 during adm --Transfuse 2 U prbcs today  5. Metabolic Bone Disease- No binders/VDRA. Follow labs  6. Nutrition - Liberalize diet. Prot supp for low albumin  7. Hx CVA  - Has BACLOFEN AS OP Med which is contraindicated with ESRD-- not on in hosp) 8. DM. Uncontrolled. Hgb A1C 9.9% - per primary  9. L ovarian cyst - F/u US as outpatient   Lynnda Child PA-C [facility] Pager [phone] 02/10/2019,9:39 AM  LOS: 5 days   Pt seen, examined and agree w A/P as above.  [facility] 02/10/2019, 12:54 PM

## 2019-02-10 NOTE — Consult Note (Signed)
Physical Medicine and Rehabilitation Consult Reason for Consult:weakness and decreased functional mobility Referring Physician: Jerald Kief   HPI: Heather [name] is a 48 y.o. female with history of right cerebellar infarct in 11/2017, diabetes, and ESRD on PD since July 2019 who developed worsening abdominal pain and was admitted on 02/05/2019 with bacterial peritonitis. Placed on IV abx and pain control addressed. On 2/11 pt became unresponsive after dose of IV dilaudid with Code Blue, narcan and CPR to resuscitate. Pt currently on vanc/fortaz for abx therapy.    Pt tells me she has had problems with her gait and balance since her stroke. She used a walker at times but also furniture walked. She had to frequently take breaks while walking to rest due to balance and being "winded." She continued to work and drive prior to this hospitalization.   Pt has been up with therapy and has demonstrated ongoing deficits with balance, gait and mobility. PM&R was asked to assess patient for ongoing rehab needs.    Review of Systems  Constitutional: Positive for fever and malaise/fatigue.  HENT: Negative for hearing loss.   Eyes: Negative for blurred vision and double vision.  Respiratory: Positive for shortness of breath.   Cardiovascular: Negative for chest pain.  Gastrointestinal: Positive for abdominal pain and nausea.  Genitourinary: Negative for dysuria.  Musculoskeletal: Positive for back pain and falls.  Skin: Negative for rash.  Neurological: Positive for dizziness, focal weakness and weakness.  Psychiatric/Behavioral: Negative for depression.   Past Medical History:  Diagnosis Date  . Antiplatelet or antithrombotic long-term use: Plavix 06/30/2017  . B12 deficiency 05/10/2014  . Blood transfusion without reported diagnosis   . Chronic constipation   . CVA (cerebral vascular accident) ([place]), nonhemorrhagic, inferior right cerebellum 12/03/2017  . Cyst of left ovary 01/12/2018  .  Diabetic neuropathy, painful ([place]), on low dose Gabapentin 07/13/2013  . DM (diabetes mellitus), type 2, uncontrolled, with renal complications ([place]) 51/70/0174  . DM2 (diabetes mellitus, type 2) ([place])   . Dysfunction of left eustachian tube, with pusatile tinnitus 11/24/2017  . Dyslipidemia associated with type 2 diabetes mellitus ([place]) 06/25/2016  . ESRD (end stage renal disease) on dialysis ([place])   . ESRD with anemia ([place])   . Fibromyalgia   . Gastroparesis due to DM   . GERD (gastroesophageal reflux disease)   . Hypertension associated with diabetes ([place]) 02/19/2011  . IBS (irritable bowel syndrome)   . Left-sided weakness 12/10/2017   .  [name] Leiomyoma of uterus   . Pancreatitis   . Proliferative diabetic retinopathy ([place]) 09/29/2015  . 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 Reactive depression 12/10/2017   .  [name] Right-sided low back pain without sciatica 12/08/2015  . Secondary hyperthyroidism 11/27/2013  . 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 Thromboembolism ([place]) 04/05/2018  . Upper airway cough syndrome, with recs to stay off ACE and take Pepcid q hs 11/15/2016   Past Surgical History:  Procedure Laterality Date  . ARTERY REPAIR Right 02/17/2018   Procedure: EXPLORATION OF RIGHT BRACHIAL ARTERY;  Surgeon: Conrad [place]burg, MD;  Location: [facility];  Service: Vascular;  Laterality: Right;  . ARTERY REPAIR Right 02/17/2018   Procedure: BRACHIAL ARTERY EXPLORATION AND TRHOMBECTOMY;  Surgeon: Conrad Force, MD;  Location: [facility] OR;  Service: Vascular;  Laterality: Right;  . AV FISTULA PLACEMENT Left 05/09/2017   Procedure: INSERTION OF ARTERIOVENOUS (AV) GRAFT ARM (ARTEGRAFT);  Surgeon: Conrad Graf, MD;  Location: [facility]  OR;  Service: Vascular;  Laterality: Left;  . AV FISTULA PLACEMENT Right 02/17/2018   Procedure: INSERTION OF ARTERIOVENOUS (AV) GORE-TEX GRAFT ARM RIGHT UPPER ARM;  Surgeon: Conrad Diablock, MD;  Location: [place];  Service: Vascular;  Laterality: Right;  .  BASCILIC VEIN TRANSPOSITION Left 08/31/2016   Procedure: BASILIC VEIN TRANSPOSITION FIRST STAGE;  Surgeon: Conrad Killdeer, MD;  Location: [place];  Service: Vascular;  Laterality: Left;  . CENTRAL VENOUS CATHETER INSERTION Left 07/24/2018   Procedure: INSERTION CENTRAL LINE ADULT;  Surgeon: Conrad Pun[place], MD;  Location: [facility];  Service: Vascular;  Laterality: Left;  . EYE SURGERY     secondary to diabetic retinopathy   . FOOT SURGERY Right    t"ook bone out- maybe hammer toe"  . INSERTION OF DIALYSIS CATHETER Left 07/24/2018   Procedure: INSERTION OF TUNNELED DIALYSIS CATHETER;  Surgeon: Conrad [place], MD;  Location: [facility];  Service: Vascular;  Laterality: Left;  . IR FLUORO GUIDE CV LINE RIGHT  07/21/2018  . IR US GUIDE VASC ACCESS RIGHT  07/21/2018  . LIGATION OF ARTERIOVENOUS  FISTULA Left 05/09/2017   Procedure: LIGATION OF ARTERIOVENOUS  FISTULA;  Surgeon: Conrad Montrose, MD;  Location: [facility];  Service: Vascular;  Laterality: Left;  . LOOP RECORDER INSERTION N/A 12/05/2017   Procedure: LOOP RECORDER INSERTION;  Surgeon: Evans Lance, MD;  Location: [facility] CV LAB;  Service: Cardiovascular;  Laterality: N/A;  . MYOMECTOMY    . REMOVAL OF GRAFT Right 02/17/2018   Procedure: REMOVAL OF RIGHT UPPER ARM ARTERIOVENOUS GRAFT;  Surgeon: Conrad [place], MD;  Location: [facility];  Service: Vascular;  Laterality: Right;  . REVISION OF ARTERIOVENOUS GORETEX GRAFT Left 07/24/2018   Procedure: REDO ARTERIOVENOUS GORETEX GRAFT;  Surgeon: Conrad Ruffin, MD;  Location: [facility];  Service: Vascular;  Laterality: Left;  . TEE WITHOUT CARDIOVERSION N/A 12/05/2017   Procedure: TRANSESOPHAGEAL ECHOCARDIOGRAM (TEE);  Surgeon: Sanda Klein, MD;  Location: [facility] ENDOSCOPY;  Service: Cardiovascular;  Laterality: N/A;  . UPPER EXTREMITY VENOGRAPHY Left 07/13/2018   Procedure: UPPER EXTREMITY VENOGRAPHY - Central & Left Arm;  Surgeon: Conrad [place], MD;  Location: [facility] CV LAB;  Service: Cardiovascular;  Laterality: Left;    . UTERINE FIBROID SURGERY    . VITRECTOMY Bilateral    Family History  Problem Relation Age of Onset  . Colon polyps Mother   . Diabetes Mother   . Heart murmur Father        history essentially unknown  . Hypertension Father   . Diabetes Sister   . Kidney failure Sister        kidney transplant  . Diabetes Brother   . Retinal degeneration Brother   . Heart murmur Son   . Stroke Paternal Grandmother   . Diabetes Sister    Social History:  reports that she has never smoked. She has never used smokeless tobacco. She reports that she does not drink alcohol or use drugs. Allergies:  Allergies  Allergen Reactions  . Ibuprofen Other (See Comments)    CKD stage 3. Should avoid.  . Dilaudid [Hydromorphone Hcl] Itching and Other (See Comments)    Can take with Benadryl.  . Tramadol Itching   Medications Prior to Admission  Medication Sig Dispense Refill  . baclofen (LIORESAL) 10 MG tablet TAKE 1/2 TABLET BY MOUTH 2 (TWO) TIMES DAILY AS NEEDED FOR MUSCLE SPASMS. 90 tablet 1  . carvedilol (COREG) 6.25 MG tablet Take 1 tablet (6.25  mg total) by mouth 2 (two) times daily with a meal. 60 tablet 6  . gabapentin (NEURONTIN) 300 MG capsule Take 1 capsule (300 mg total) by mouth 2 (two) times daily. (Patient taking differently: Take 300 mg by mouth 2 (two) times daily as needed (pain). ) 180 capsule 1  . insulin aspart (NOVOLOG) 100 UNIT/ML injection Inject 6-8 Units into the skin 3 (three) times daily before meals. 8 UNITS WITH BREAKFAST, 6 UNITS WITH LUNCH, & 8 UNITS WITH SUPPER    . insulin degludec (TRESIBA FLEXTOUCH) 100 UNIT/ML SOPN FlexTouch Pen Inject 0.2 mLs (20 Units total) into the skin daily with lunch. (Patient taking differently: Inject 28 Units into the skin daily with lunch. ) 1 pen 3  . lidocaine-prilocaine (EMLA) cream Apply 1 application topically as directed. For Dialysis use  11  . lubiprostone (AMITIZA) 24 MCG capsule Take 1 capsule (24 mcg total) by mouth 2 (two) times  daily with a meal. (Patient taking differently: Take 24 mcg by mouth 2 (two) times daily with a meal. As needed) 60 capsule 5  . oxyCODONE (ROXICODONE) 5 MG immediate release tablet Take 0.5 tablets (2.5 mg total) by mouth every 6 (six) hours as needed for severe pain. 20 tablet 0  . promethazine (PHENERGAN) 25 MG tablet Take 25 mg by mouth every 12 (twelve) hours as needed for nausea.    . simvastatin (ZOCOR) 10 MG tablet Take 0.5 tablets (5 mg total) by mouth at bedtime. 1/2 po q hs - GFR 14 45 tablet 1  . metroNIDAZOLE (FLAGYL) 500 MG tablet Take 1 tablet (500 mg total) by mouth 2 (two) times daily for 7 days. 14 tablet 0    Home: Home Living Family/patient expects to be discharged to:: Private residence Living Arrangements: Spouse/significant other Available Help at Discharge: Family, Available PRN/intermittently Type of Home: House Home Access: Stairs to enter Technical brewer of Steps: 2 Entrance Stairs-Rails: None Home Layout: One level Bathroom Shower/Tub: Chiropodist: Standard Home Equipment: Environmental consultant - 2 wheels, Cane - single point  Functional History: Prior Function Level of Independence: Independent Comments: still working Functional Status:  Mobility: Bed Mobility Overal bed mobility: Needs Assistance Bed Mobility: Supine to Sit Supine to sit: Mod assist, +2 for physical assistance General bed mobility comments: Patient required modA +2 for trunk control to sit EOB. Verbal cues to reach for hand rail.  Transfers Overall transfer level: Needs assistance Transfers: Sit to/from Stand Sit to Stand: Mod assist, +2 physical assistance General transfer comment: Patient required modA +2 for lift assist to stand. Patient with dizziness in standing that subsided.  Ambulation/Gait Ambulation/Gait assistance: Mod assist, +2 physical assistance Gait Distance (Feet): 2 Feet Assistive device: 2 person hand held assist Gait Pattern/deviations: Step-to  pattern, Decreased step length - right, Decreased step length - left, Decreased stride length General Gait Details: Patient required modA +2 for steadying with 2 person HHA to ambulate short distance to chair. Patient with significant unsteadiness requiring external support to maintain balance Gait velocity: decreased Gait velocity interpretation: <1.8 ft/sec, indicate of risk for recurrent falls    ADL:    Cognition: Cognition Overall Cognitive Status: Within Functional Limits for tasks assessed Orientation Level: Oriented X4 Cognition Arousal/Alertness: Awake/alert Behavior During Therapy: WFL for tasks assessed/performed Overall Cognitive Status: Within Functional Limits for tasks assessed  Blood pressure (!) 165/74, pulse 85, temperature 98.2 F (36.8 C), temperature source Oral, resp. rate 18, weight 108.8 kg, last menstrual period 12/13/2018, SpO2 100 %. Physical Exam  Constitutional: [name] oriented to person, place, and time. No distress.  Obese   HENT:  Head: Normocephalic.  Eyes: Pupils are equal, round, and reactive to light. Conjunctivae are normal.  Neck: Normal range of motion.  Cardiovascular: Normal rate and regular rhythm. Exam reveals no friction rub.  No murmur heard. Respiratory: Effort normal. No respiratory distress.  GI: She exhibits no distension. There is abdominal tenderness.  PD site dressed  Musculoskeletal: Normal range of motion.        General: Edema present.  Neurological: [name] alert and oriented to person, place, and time.  Decreased [place] RUE and RLE. Speech clear. Had difficulty tracking. Visual fields intact. Strength 3-4/5 UE prox to distal and 3/5 LE's. No focal sensory loss  Skin: Skin is warm. [name] not diaphoretic.  Psychiatric: She has a normal mood and affect. Her behavior is normal.    Results for orders placed or performed during the hospital encounter of 02/05/19 (from the past 24 hour(s))  Glucose, capillary     Status:  Abnormal   Collection Time: 02/09/19 11:09 AM  Result Value Ref Range   Glucose-Capillary 117 (H) 70 - 99 mg/dL  Glucose, capillary     Status: None   Collection Time: 02/09/19  4:46 PM  Result Value Ref Range   Glucose-Capillary 77 70 - 99 mg/dL  Glucose, capillary     Status: Abnormal   Collection Time: 02/09/19  9:15 PM  Result Value Ref Range   Glucose-Capillary 207 (H) 70 - 99 mg/dL  CBC     Status: Abnormal   Collection Time: 02/10/19  6:46 AM  Result Value Ref Range   WBC 8.4 4.0 - 10.5 K/uL   RBC 2.53 (L) 3.87 - 5.11 MIL/uL   Hemoglobin 7.4 (L) 12.0 - 15.0 g/dL   HCT 24.1 (L) 36.0 - 46.0 %   MCV 95.3 80.0 - 100.0 fL   MCH 29.2 26.0 - 34.0 pg   MCHC 30.7 30.0 - 36.0 g/dL   RDW 13.6 11.5 - 15.5 %   Platelets 331 150 - 400 K/uL   nRBC 0.5 (H) 0.0 - 0.2 %  Basic metabolic panel     Status: Abnormal   Collection Time: 02/10/19  6:46 AM  Result Value Ref Range   Sodium 133 (L) 135 - 145 mmol/L   Potassium 3.0 (L) 3.5 - 5.1 mmol/L   Chloride 99 98 - 111 mmol/L   CO2 24 22 - 32 mmol/L   Glucose, Bld 176 (H) 70 - 99 mg/dL   BUN 41 (H) 6 - 20 mg/dL   Creatinine, Ser 7.66 (H) 0.44 - 1.00 mg/dL   Calcium 7.0 (L) 8.9 - 10.3 mg/dL   GFR calc non Af Amer 6 (L) >60 mL/min   GFR calc Af Amer 7 (L) >60 mL/min   Anion gap 10 5 - 15  Glucose, capillary     Status: Abnormal   Collection Time: 02/10/19  7:32 AM  Result Value Ref Range   Glucose-Capillary 140 (H) 70 - 99 mg/dL   No results found.  Assessment/Plan: Diagnosis: 48 yo female with baseline gait disorder after previous right cerebellar infarct who is now having further problems with mobility and self-care after being hospitalized for bacterial peritonitis 1. Does the need for close, 24 hr/day medical supervision in concert with the patient's rehab needs make it unreasonable for this patient to be served in a less intensive setting? Yes 2. Co-Morbidities requiring supervision/potential complications: ESRD, ID  considerations,  nutrition, wound care 3. Due to bladder management, bowel management, safety, skin/wound care, disease management, medication administration, pain management and patient education, does the patient require 24 hr/day rehab nursing? Yes 4. Does the patient require coordinated care of a physician, rehab nurse, PT (1-2 hrs/day, 5 days/week) and OT (1-2 hrs/day, 5 days/week) to address physical and functional deficits in the context of the above medical diagnosis(es)? Yes Addressing deficits in the following areas: balance, endurance, locomotion, strength, transferring, bowel/bladder control, bathing, dressing, feeding, grooming, toileting and psychosocial support 5. Can the patient actively participate in an intensive therapy program of at least 3 hrs of therapy per day at least 5 days per week? Yes 6. The potential for patient to make measurable gains while on inpatient rehab is excellent 7. Anticipated functional outcomes upon discharge from inpatient rehab are modified independent  with PT, modified independent with OT, n/a with SLP. 8. Estimated rehab length of stay to reach the above functional goals is: 7-10 days 9. Anticipated D/C setting: Home 10. Anticipated post D/C treatments: [place] therapy 11. Overall Rehab/Functional Prognosis: excellent  RECOMMENDATIONS: This patient's condition is appropriate for continued rehabilitative care in the following setting: CIR Patient has agreed to participate in recommended program. Yes Note that insurance prior authorization may be required for reimbursement for recommended care.  Comment: Pt is motivated and was working and independent prior to admission. Rehab Admissions Coordinator to follow up.  Thanks,  Meredith Staggers, MD, Mellody Drown  I have personally performed a face to face diagnostic evaluation of this patient. Additionally, I have reviewed and concur with the physician assistant's documentation above.    Meredith Staggers,  MD 02/10/2019

## 2019-02-10 NOTE — Progress Notes (Signed)
Found patient in the bathroom and had disconnected herself from the PD machine. Pt. was advised/educated that patient should not do that and to let RN know before doing anything. Patient stated she does it all the time a home. Patient was educated not to do it again. HD RN was notified and came to see patient.

## 2019-02-10 NOTE — Evaluation (Signed)
Occupational Therapy Evaluation Patient Details Name: Heather [name] MRN: [id number] DOB: 08/06/1971 Today's Date: 02/10/2019    History of Present Illness Patient is 48 y/o female presenting to hospital with worsening L lower abdominal pain secondary to peritonitis likely dialysis associated. Patient became unresponsive on Feb 11 and code blue was called. CPR and narcan administered and patient regained conciousness. PMH includes ESRD on PD, DMII, HTN, gastroparesis, hyperparathroidism, CKD, anemia, hx of CVA, and L ovarian cyst.    Clinical Impression   PTA Pt independent, works. Pt is currently min to mod A for LB ADL, mod A +2 for sit <>stand transfers, decreased activity tolerance, decreased balance. Pt eager to work with therapy despite undergoing blood transfusion. Pt will require skilled OT in the acute setting as well as at the CIR level to maximize safety and independence in ADL and functional transfers to return Pt to PLOF. Pt has excellent support system in husband/family and is highly motivated - excellent CIR candidate. Next session to focus on energy conservation education and incorporating that into ADL.    Follow Up Recommendations  CIR;Supervision/Assistance - 24 hour    Equipment Recommendations  Other (comment)(defer to next venue of care)    Recommendations for Other Services       Precautions / Restrictions Precautions Precautions: Fall Restrictions Weight Bearing Restrictions: No      Mobility Bed Mobility Overal bed mobility: Needs Assistance Bed Mobility: Supine to Sit     Supine to sit: Mod assist;+2 for safety/equipment;HOB elevated     General bed mobility comments: assist for trunk elevation, +2 for safety - use of bed rails and elevated HOB  Transfers Overall transfer level: Needs assistance Equipment used: Rolling walker (2 wheeled) Transfers: Sit to/from Stand Sit to Stand: Mod assist;+2 physical assistance         General  transfer comment: Patient required modA +2 for lift assist to stand. Patient with dizziness in standing that subsided.     Balance Overall balance assessment: Needs assistance Sitting-balance support: No upper extremity supported Sitting balance-Leahy Scale: Good Sitting balance - Comments: EOB for grooming activitivies   Standing balance support: Bilateral upper extremity supported Standing balance-Leahy Scale: Poor Standing balance comment: reliant on BUE support and external assistance to maintain standing balance "I feel like I'm falling forward"                           ADL either performed or assessed with clinical judgement   ADL Overall ADL's : Needs assistance/impaired Eating/Feeding: Modified independent;Sitting   Grooming: Wash/dry hands;Wash/dry face;Oral care;Set up;Sitting Grooming Details (indicate cue type and reason): EOB, unable to maintain standing at this time Upper Body Bathing: Minimal assistance   Lower Body Bathing: Minimal assistance;Sitting/lateral leans   Upper Body Dressing : Minimal assistance   Lower Body Dressing: Minimal assistance Lower Body Dressing Details (indicate cue type and reason): increased time, able to don/doff socks EOB - requires assist for items that are typically sit<>stand Toilet Transfer: Moderate assistance;+2 for physical assistance;+2 for safety/equipment;Stand-pivot;BSC;RW   Toileting- Clothing Manipulation and Hygiene: Maximal assistance;Sit to/from stand Toileting - Clothing Manipulation Details (indicate cue type and reason): Pt requires BUE for balance, peri care requires assist from external person     Functional mobility during ADLs: Moderate assistance;+2 for safety/equipment;Rolling walker General ADL Comments: decreased activity tolerance,      Vision Patient Visual Report: No change from baseline       Perception  Praxis      Pertinent Vitals/Pain Pain Assessment: Faces Faces Pain Scale:  Hurts little more Pain Location: headache Pain Descriptors / Indicators: Headache Pain Intervention(s): Monitored during session;Repositioned     Hand Dominance Right   Extremity/Trunk Assessment Upper Extremity Assessment Upper Extremity Assessment: Generalized weakness   Lower Extremity Assessment Lower Extremity Assessment: Defer to PT evaluation   Cervical / Trunk Assessment Cervical / Trunk Assessment: Normal   Communication Communication Communication: No difficulties   Cognition Arousal/Alertness: Awake/alert Behavior During Therapy: WFL for tasks assessed/performed Overall Cognitive Status: Within Functional Limits for tasks assessed                                     General Comments  husband present throughout and very supportive    Exercises     Shoulder Instructions      Home Living Family/patient expects to be discharged to:: Private residence Living Arrangements: Spouse/significant other Available Help at Discharge: Family;Available PRN/intermittently Type of Home: House Home Access: Stairs to enter [employer] of Steps: 2 Entrance Stairs-Rails: None Home Layout: One level     Bathroom Shower/Tub: Teacher, early years/pre: Standard     Home Equipment: Environmental consultant - 2 wheels;Cane - single point          Prior Functioning/Environment Level of Independence: Independent        Comments: still working        OT Problem List: Decreased strength;Decreased activity tolerance;Impaired balance (sitting and/or standing);Decreased knowledge of use of DME or AE      OT Treatment/Interventions: Self-care/ADL training;Therapeutic exercise;Energy conservation;DME and/or AE instruction;Therapeutic activities;Patient/family education;Balance training    OT Goals(Current goals can be found in the care plan section) Acute Rehab OT Goals Patient Stated Goal: get back to work OT Goal Formulation: With patient/family Time  For Goal Achievement: 02/24/19 Potential to Achieve Goals: Good ADL Goals Pt Will Perform Grooming: with modified independence;standing Pt Will Perform Upper Body Dressing: with modified independence;sitting Pt Will Perform Lower Body Dressing: with modified independence;sit to/from stand Pt Will Transfer to Toilet: with modified independence;ambulating Pt Will Perform Toileting - Clothing Manipulation and hygiene: with modified independence;sit to/from stand Additional ADL Goal #1: Pt will recall 3 ways of conserving energy during ADL at independent level  OT Frequency: Min 3X/week   Barriers to D/C:            Co-evaluation              AM-PAC OT "6 Clicks" Daily Activity     Outcome Measure Help from another person eating meals?: None Help from another person taking care of personal grooming?: A Little Help from another person toileting, which includes using toliet, bedpan, or urinal?: A Lot Help from another person bathing (including washing, rinsing, drying)?: A Lot Help from another person to put on and taking off regular upper body clothing?: A Little Help from another person to put on and taking off regular lower body clothing?: A Lot 6 Click Score: 16   End of Session Equipment Utilized During Treatment: Gait belt;Rolling walker Nurse Communication: Mobility status  Activity Tolerance: Patient tolerated treatment well Patient left: in bed;with call bell/phone within reach;with family/visitor present  OT Visit Diagnosis: Unsteadiness on feet (R26.81);Other abnormalities of gait and mobility (R26.89);Muscle weakness (generalized) (M62.81);History of falling (Z91.81)                Time: 6010-9323  OT Time Calculation (min): 30 min Charges:  OT General Charges $OT Visit: 1 Visit OT Evaluation $OT Eval Moderate Complexity: 1 Mod OT Treatments $Self Care/Home Management : 8-22 mins  Hulda Humphrey OTR/L Acute Rehabilitation Services Pager: [phone] Office:  [phone]   Heather [name] Heather [name] 02/10/2019, 4:42 PM

## 2019-02-11 LAB — TYPE AND SCREEN
ABO/RH(D): O POS
Antibody Screen: NEGATIVE
Unit division: 0
Unit division: 0

## 2019-02-11 LAB — BASIC METABOLIC PANEL
Anion gap: 12 (ref 5–15)
BUN: 39 mg/dL — ABNORMAL HIGH (ref 6–20)
CHLORIDE: 99 mmol/L (ref 98–111)
CO2: 24 mmol/L (ref 22–32)
Calcium: 7.6 mg/dL — ABNORMAL LOW (ref 8.9–10.3)
Creatinine, Ser: 7.59 mg/dL — ABNORMAL HIGH (ref 0.44–1.00)
GFR calc Af Amer: 7 mL/min — ABNORMAL LOW (ref >60)
GFR calc non Af Amer: 6 mL/min — ABNORMAL LOW (ref >60)
Glucose, Bld: 230 mg/dL — ABNORMAL HIGH (ref 70–99)
Potassium: 3.1 mmol/L — ABNORMAL LOW (ref 3.5–5.1)
Sodium: 135 mmol/L (ref 135–145)

## 2019-02-11 LAB — CBC
HCT: 34.6 % — ABNORMAL LOW (ref 36.0–46.0)
Hemoglobin: 10.6 g/dL — ABNORMAL LOW (ref 12.0–15.0)
MCH: 28.6 pg (ref 26.0–34.0)
MCHC: 30.6 g/dL (ref 30.0–36.0)
MCV: 93.5 fL (ref 80.0–100.0)
Platelets: 317 10*3/uL (ref 150–400)
RBC: 3.7 MIL/uL — ABNORMAL LOW (ref 3.87–5.11)
RDW: 16.1 % — ABNORMAL HIGH (ref 11.5–15.5)
WBC: 10 10*3/uL (ref 4.0–10.5)
nRBC: 0.9 % — ABNORMAL HIGH (ref 0.0–0.2)

## 2019-02-11 LAB — BPAM RBC
Blood Product Expiration Date: 202003122359
Blood Product Expiration Date: 202003122359
ISSUE DATE / TIME: 202002151717
ISSUE DATE / TIME: 202002151330
UNIT TYPE AND RH: 5100
Unit Type and Rh: 5100

## 2019-02-11 LAB — VITAMIN B12: Vitamin B-12: 573 pg/mL (ref 180–914)

## 2019-02-11 LAB — GLUCOSE, CAPILLARY
Glucose-Capillary: 253 mg/dL — ABNORMAL HIGH (ref 70–99)
Glucose-Capillary: 155 mg/dL — ABNORMAL HIGH (ref 70–99)
Glucose-Capillary: 116 mg/dL — ABNORMAL HIGH (ref 70–99)
Glucose-Capillary: 173 mg/dL — ABNORMAL HIGH (ref 70–99)

## 2019-02-11 MED ORDER — HEPARIN 1000 UNIT/ML FOR PERITONEAL DIALYSIS
INTRAPERITONEAL | Status: DC | PRN
  Filled 2019-02-11: qty 5000

## 2019-02-11 MED ORDER — DELFLEX-LC/4.25% DEXTROSE 483 MOSM/L IP SOLN
INTRAPERITONEAL | Status: DC
  Administered 2019-02-11: 19:00:00 via INTRAPERITONEAL

## 2019-02-11 MED ORDER — POTASSIUM CHLORIDE CRYS ER 20 MEQ PO TBCR
30.0000 meq | EXTENDED_RELEASE_TABLET | Freq: Three times a day (TID) | ORAL | Status: AC
  Administered 2019-02-11 – 2019-02-12 (×3): 30 meq via ORAL
  Filled 2019-02-11 (×3): qty 1

## 2019-02-11 MED ORDER — SORBITOL 70 % SOLN
30.0000 mL | Freq: Once | Status: AC
  Administered 2019-02-11: 30 mL via ORAL
  Filled 2019-02-11: qty 30

## 2019-02-11 MED ORDER — GENTAMICIN SULFATE 0.1 % EX CREA
1.0000 "application " | TOPICAL_CREAM | Freq: Every day | CUTANEOUS | Status: DC
  Administered 2019-02-11: 1 via TOPICAL
  Filled 2019-02-11: qty 15

## 2019-02-11 MED ORDER — DELFLEX-LC/4.25% DEXTROSE 483 MOSM/L IP SOLN: INTRAPERITONEAL | Status: DC

## 2019-02-11 MED ORDER — HEPARIN 1000 UNIT/ML FOR PERITONEAL DIALYSIS: 500.0000 [IU] | INTRAMUSCULAR | Status: DC | PRN

## 2019-02-11 NOTE — Progress Notes (Addendum)
[facility] Progress Note   Subjective:  Seen in room. Eating better. Weak, prob going to CIR.    bjective Vitals:   02/10/19 2000 02/10/19 2045 02/11/19 0522 02/11/19 0923  BP: (!) 175/82 (!) 149/82 120/68 (!) 184/92  Pulse: 88 83 78 78  Resp: 19 18 17 18   Temp: 98 F (36.7 C) 97.6 F (36.4 C) 98.1 F (36.7 C) 98 F (36.7 C)  TempSrc: Oral Oral Oral Oral  SpO2: 99% 98% 97% 100%  Weight: 110 kg       Physical Exam General: WNWD female NAD  Heart: RRR Lungs: CTAB  Abdomen: obese soft NTND Extremities: No LE edema  Dialysis Access: PD cath in place   Dialysis Orders:  CCPD  7 X /week with 5 exchanges overnight, Dwell  Time 1.5 hrs ,no day bag or pause, 99kg edw, 2500 mL dwell  Last OP Mircera 50 mg given 11/28/18    Assessment/Plan: 1. PD cath related peritonitis--1st peritonitis episode. On IV Fortaz/Vancomycin. PD fluid cx 2/13 neg to date. May be breaking technique by breaking off to use the bathroom at night at home and not re-connecting properly. Will d/w PD team this week. ^'d to 2.5 L dwells 1 mo ago, but says she cannot tolerate > SOB. Will try 2250 cc dwells (was 2000) and d/w primary neph this week.  2. Debility - probably will be going to CIR soon  3. ESRD - Continue CCPD. Wts are up, use all 4.25% tonight 4. HTN/volume- On Coreg 6.25 bid. ^UF as above 5. Anemia of CKD/ chronic disease: Hb down (hemorraghic ovarian cyst noted on CT) Aranesp 60 started 2/12.  Tsat 19% Ferritin 1628. Got 2 U prbcs 2/15. Hb up 10.6. Feeling a lot better.   6. Metabolic Bone Disease- No binders/VDRA. Follow labs  7. Nutrition - Liberalize diet. Prot supp for low albumin  8. Hx CVA - Has BACLOFEN AS OP Med which is contraindicated with ESRD 9. DM. Uncontrolled. Hgb A1C 9.9% - per primary    [facility] 02/11/2019, 11:51 AM   Weight change: -2.7 kg   Additional Objective Labs: Basic Metabolic Panel: Recent Labs  Lab 02/07/19 0625  02/10/19 0646 02/11/19 0854  NA 131* 133* 135  K 3.5 3.0* 3.1*  CL 97* 99 99  CO2 24 24 24   GLUCOSE 227* 176* 230*  BUN 61* 41* 39*  CREATININE 9.58* 7.66* 7.59*  CALCIUM 6.8* 7.0* 7.6*  PHOS  --  5.1*  --    CBC: Recent Labs  Lab 02/06/19 0538 02/07/19 0625 02/08/19 0435 02/10/19 0646 02/11/19 0854  WBC 7.8 6.3 8.8 8.4 10.0  NEUTROABS  --  4.4  --   --   --   HGB 8.1* 7.4* 7.5* 7.4* 10.6*  HCT 27.0* 24.2* 24.1* 24.1* 34.6*  MCV 95.1 95.3 96.0 95.3 93.5  PLT 260 266 301 331 317   Blood Culture    Component Value Date/Time   SDES PERITONEAL DIALYSATE 02/08/2019 1050   SDES PERITONEAL DIALYSATE 02/08/2019 1050   SPECREQUEST NONE 02/08/2019 1050   SPECREQUEST NONE 02/08/2019 1050   CULT  02/08/2019 1050    NO GROWTH 3 DAYS Performed at [facility], [place] [address], [place],  [address]    REPTSTATUS PENDING 02/08/2019 1050   REPTSTATUS 02/08/2019 FINAL 02/08/2019 1050     Medications: . cefTAZidime (FORTAZ)  IV Stopped (02/10/19 1237)  . dialysis solution 2.5% low-MG/low-CA    . dialysis solution 4.25%  low-MG/low-CA     . carvedilol  6.25 mg Oral BID WC  . darbepoetin (ARANESP) injection - NON-DIALYSIS  60 mcg Subcutaneous Q Wed-1800  . feeding supplement (PRO-STAT SUGAR FREE 64)  30 mL Oral BID  . heparin  5,000 Units Subcutaneous Q8H  . insulin aspart  0-15 Units Subcutaneous TID WC  . insulin aspart  6 Units Subcutaneous Q lunch  . insulin aspart  8 Units Subcutaneous Q breakfast  . insulin aspart  8 Units Subcutaneous Q supper  . insulin glargine  28 Units Subcutaneous Q lunch  . lidocaine-prilocaine  1 application Topical UD  . polyethylene glycol  17 g Oral Daily  . senna-docusate  1 tablet Oral BID  . simvastatin  5 mg Oral QHS  . sodium chloride flush  3 mL Intravenous Q12H  . sorbitol  30 mL Oral Once  . vancomycin variable dose per unstable renal function (pharmacist dosing)   Does not apply See admin instructions

## 2019-02-11 NOTE — Progress Notes (Signed)
PD tx initiated via tenckhoff w/o problem VSS Report given to Leta Speller, RN

## 2019-02-11 NOTE — Progress Notes (Signed)
PROGRESS NOTE    Heather [name]  JKD:[id number] DOB: 1971/04/15 DOA: 02/05/2019 PCP: Briscoe Deutscher, DO    Brief Narrative: 48 year old with past medical history significant for type 2 diabetes, end-stage renal disease on peritoneal dialysis since July 2019, chronic constipation, nonhemorrhagic cerebrovascular accident, proliferative diabetic retinopathy who presents complaining of worsening left lower abdominal pain.  The pain is worse when she tries to do peritoneal dialysis.  She was able to complete dialysis 4 days ago.  She also reports nausea vomiting.  She was a started on metronidazole for bacterial vaginosis.  Patient on February 11 became unresponsive after 2 mg of IV Dilaudid.  CODE BLUE was called.  Patient received CPR.  After Narcan patient regained consciousness.   Assessment & Plan:   Principal Problem:   Peritonitis, dialysis-associated ([place]) Active Problems:   Diabetes mellitus, type II, insulin dependent ([place]), on CGM, with end organ damage: renal, neuropathy, gastroparesis, retinopathy   Hypertension associated with diabetes ([place])   Gastroparesis due to DM ([place])   Secondary hyperparathyroidism ([place])   CKD (chronic kidney disease) stage 5, GFR less than 15 ml/min ([facility])   Peritoneal dialysis catheter in place [facility])   Peritonitis, dialysis-associated, initial encounter ([place])   Left ovarian cyst   1-Peritonitis likely related to PD catheter; CT abdomen on presentation did show decrease free air in the abdomen with only tiny focus remaining adjacent to the liver. Hemorrhagic cyst in the left ovary. Peritoneal fluid culture no growth today.  White blood cell 210 with 81% neutrophils. Peritoneal catheter now functioning. Continue with IV antibiotics.  At discharge they will do antibiotics intraperitoneal. Zofran PRN Abdominal pain and nausea improved. Eating more.   2-Loss of consciousness, respiratory arrest: After 2 mg of IV Dilaudid patient became  unresponsive. CPR was a started.  After Narcan patient regained consciousness. Discontinue IV Dilaudid. Monitor on oral tramadol. ECHO normal EF  Anemia received 2 units PRBC. HB up to 10   End-stage renal disease on peritoneal dialysis Nephrology managing.  Insulin-dependent diabetic: Continue with insulin.  Hypertension; blood pressure stable.  Bacterial Vaginosis: hold flagyl due to vomiting.   Left ovarian cyst; pelvic ultrasound showed 2.8 cm mildly complex left ovarian cyst.  Patient will need ultrasound in 6 to 12 weeks preferably during the week following patient's normal menses.   Hypokalemia; will defer to nephrology  Constipation; lactulose.    Estimated body mass index is 35.81 kg/m as calculated from the following:   Height as of 02/03/19: 5\' 9"  (1.753 m).   Weight as of this encounter: 110 kg.   DVT prophylaxis: Heparin Code Status: Full code Family Communication: Care discussed with patient and family who was at bedside Disposition Plan: poor oral intake. Cir consulted, awaiting insurance approval.   Consultants:   Nephrology   Procedures:   Echo   Antimicrobials:  Fortaz 2-11 Vancomycin 2-11  Subjective: Feeling better, pain improved. No bM in few days   Objective: Vitals:   02/10/19 2000 02/10/19 2045 02/11/19 0522 02/11/19 0923  BP: (!) 175/82 (!) 149/82 120/68 (!) 184/92  Pulse: 88 83 78 78  Resp: 19 18 17 18   Temp: 98 F (36.7 C) 97.6 F (36.4 C) 98.1 F (36.7 C) 98 F (36.7 C)  TempSrc: Oral Oral Oral Oral  SpO2: 99% 98% 97% 100%  Weight: 110 kg       Intake/Output Summary (Last 24 hours) at 02/11/2019 1522 Last data filed at 02/11/2019 1256 Gross per 24 hour  Intake 938 ml  Output 0 ml  Net 938 ml   Filed Weights   02/09/19 0910 02/10/19 0650 02/10/19 2000  Weight: 112.7 kg 108.8 kg 110 kg    Examination:  General exam: NAD Respiratory system: Bilateral crackles.  Cardiovascular system; S 1, S 2 RRR Gastrointestinal  system: BS present, soft, nt Central nervous system: Non focal.  Extremities: Symmetric power.     Data Reviewed: I have personally reviewed following labs and imaging studies  CBC: Recent Labs  Lab 02/06/19 0538 02/07/19 0625 02/08/19 0435 02/10/19 0646 02/11/19 0854  WBC 7.8 6.3 8.8 8.4 10.0  NEUTROABS  --  4.4  --   --   --   HGB 8.1* 7.4* 7.5* 7.4* 10.6*  HCT 27.0* 24.2* 24.1* 24.1* 34.6*  MCV 95.1 95.3 96.0 95.3 93.5  PLT 260 266 301 331 812   Basic Metabolic Panel: Recent Labs  Lab 02/05/19 0932 02/05/19 1628 02/06/19 0538 02/07/19 0625 02/10/19 0646 02/11/19 0854  NA 135  --  133* 131* 133* 135  K 3.9  --  4.0 3.5 3.0* 3.1*  CL 101  --  98 97* 99 99  CO2 22  --  19* 24 24 24   GLUCOSE 235*  --  246* 227* 176* 230*  BUN 69*  --  69* 61* 41* 39*  CREATININE 11.03* 10.68* 10.38* 9.58* 7.66* 7.59*  CALCIUM 6.9*  --  7.1* 6.8* 7.0* 7.6*  PHOS  --   --   --   --  5.1*  --    GFR: Estimated Creatinine Clearance: 11.8 mL/min (A) (by C-G formula based on SCr of 7.59 mg/dL (H)). Liver Function Tests: Recent Labs  Lab 02/05/19 0932 02/07/19 0625  AST 13* 31  ALT 16 31  ALKPHOS 129* 106  BILITOT 1.0 0.6  PROT 7.3 6.2*  ALBUMIN 2.3* 2.0*   Recent Labs  Lab 02/05/19 0932  LIPASE 43   No results for input(s): AMMONIA in the last 168 hours. Coagulation Profile: No results for input(s): INR, PROTIME in the last 168 hours. Cardiac Enzymes: No results for input(s): CKTOTAL, CKMB, CKMBINDEX, TROPONINI in the last 168 hours. BNP (last 3 results) Recent Labs    04/11/18 1421  PROBNP 65.0   HbA1C: No results for input(s): HGBA1C in the last 72 hours. CBG: Recent Labs  Lab 02/10/19 1621 02/10/19 1650 02/10/19 2208 02/11/19 0710 02/11/19 1147  GLUCAP 67* 71 153* 253* 155*   Lipid Profile: No results for input(s): CHOL, HDL, LDLCALC, TRIG, CHOLHDL, LDLDIRECT in the last 72 hours. Thyroid Function Tests: No results for input(s): TSH, T4TOTAL, FREET4,  T3FREE, THYROIDAB in the last 72 hours. Anemia Panel: No results for input(s): VITAMINB12, FOLATE, FERRITIN, TIBC, IRON, RETICCTPCT in the last 72 hours. Sepsis Labs: Recent Labs  Lab 02/05/19 1700 02/07/19 0625  LATICACIDVEN 1.5 1.1    Recent Results (from the past 240 hour(s))  Blood culture (routine x 2)     Status: None   Collection Time: 02/03/19  8:15 PM  Result Value Ref Range Status   Specimen Description   Final    BLOOD BLOOD LEFT FOREARM Performed at [facility], [address] [place], [place] [address]    Special Requests   Final    BOTTLES DRAWN AEROBIC AND ANAEROBIC Blood Culture adequate volume Performed at [facility], [address] [place], [place] [address]    Culture   Final    NO GROWTH 5 DAYS Performed at [facility], [address] [place], [place] [address]    Report Status 02/08/2019 FINAL  Final  Wet prep, genital     Status: Abnormal   Collection Time: 02/03/19  8:45 PM  Result Value Ref Range Status   Yeast Wet Prep HPF POC NONE SEEN NONE SEEN Final   Trich, Wet Prep NONE SEEN NONE SEEN Final   Clue Cells Wet Prep HPF POC PRESENT (A) NONE SEEN Final   WBC, Wet Prep HPF POC NONE SEEN NONE SEEN Final   Sperm NONE SEEN  Final    Comment: Performed at [facility], [address] [place], [place] [address]  Body fluid culture     Status: None   Collection Time: 02/03/19  9:10 PM  Result Value Ref Range Status   Specimen Description   Final    PERITONEAL DIALYSATE Performed at [facility], [address] [place], [place] [address]    Special Requests   Final    NONE Performed at [facility], [address] [place], [place] [address]    Gram Stain   Final    FEW WBC PRESENT, PREDOMINANTLY PMN NO ORGANISMS SEEN    Culture   Final    NO GROWTH 3 DAYS Performed at [facility], [place] [address] [place], [place] [address]    Report Status 02/07/2019 FINAL  Final    Culture, body fluid-bottle     Status: None (Preliminary result)   Collection Time: 02/08/19 10:50 AM  Result Value Ref Range Status   Specimen Description PERITONEAL DIALYSATE  Final   Special Requests NONE  Final   Culture   Final    NO GROWTH 3 DAYS Performed at [place] [address], [place], [place] [address]    Report Status PENDING  Incomplete  Gram stain     Status: None   Collection Time: 02/08/19 10:50 AM  Result Value Ref Range Status   Specimen Description PERITONEAL DIALYSATE  Final   Special Requests NONE  Final   Gram Stain   Final    WBC PRESENT,BOTH PMN AND MONONUCLEAR NO ORGANISMS SEEN CYTOSPIN SMEAR Performed at [facility], [address] [place], [place] [address]    Report Status 02/08/2019 FINAL  Final         Radiology Studies: No results found.      Scheduled Meds: . carvedilol  6.25 mg Oral BID WC  . darbepoetin (ARANESP) injection - NON-DIALYSIS  60 mcg Subcutaneous Q Wed-1800  . feeding supplement (PRO-STAT SUGAR FREE 64)  30 mL Oral BID  . gentamicin cream  1 application Topical Daily  . heparin  5,000 Units Subcutaneous Q8H  . insulin aspart  0-15 Units Subcutaneous TID WC  . insulin aspart  6 Units Subcutaneous Q lunch  . insulin aspart  8 Units Subcutaneous Q breakfast  . insulin aspart  8 Units Subcutaneous Q supper  . insulin glargine  28 Units Subcutaneous Q lunch  . lidocaine-prilocaine  1 application Topical UD  . polyethylene glycol  17 g Oral Daily  . potassium chloride  30 mEq Oral TID  . senna-docusate  1 tablet Oral BID  . simvastatin  5 mg Oral QHS  . sodium chloride flush  3 mL Intravenous Q12H  . vancomycin variable dose per unstable renal function (pharmacist dosing)   Does not apply See admin instructions   Continuous Infusions: . cefTAZidime (FORTAZ)  IV  1 g (02/11/19 1333)  . dialysis solution 4.25% low-MG/low-CA       LOS: 6 days    Time spent: 35 minutes.     Elmarie Shiley,  MD [facility]  02/11/2019, 3:22 PM

## 2019-02-11 NOTE — Plan of Care (Signed)
  Problem: Pain Managment: Goal: General experience of comfort will improve Outcome: Progressing   Problem: Clinical Measurements: Goal: Ability to maintain clinical measurements within normal limits will improve Outcome: Progressing   

## 2019-02-12 ENCOUNTER — Other Ambulatory Visit: Payer: Self-pay | Admitting: Family Medicine

## 2019-02-12 ENCOUNTER — Encounter (HOSPITAL_COMMUNITY): Payer: Self-pay | Admitting: Nephrology

## 2019-02-12 ENCOUNTER — Inpatient Hospital Stay (HOSPITAL_COMMUNITY): Payer: Managed Care, Other (non HMO)

## 2019-02-12 DIAGNOSIS — E1140 Type 2 diabetes mellitus with diabetic neuropathy, unspecified: Secondary | ICD-10-CM

## 2019-02-12 LAB — GLUCOSE, CAPILLARY
Glucose-Capillary: 257 mg/dL — ABNORMAL HIGH (ref 70–99)
Glucose-Capillary: 221 mg/dL — ABNORMAL HIGH (ref 70–99)
Glucose-Capillary: 105 mg/dL — ABNORMAL HIGH (ref 70–99)
Glucose-Capillary: 145 mg/dL — ABNORMAL HIGH (ref 70–99)
Glucose-Capillary: 177 mg/dL — ABNORMAL HIGH (ref 70–99)

## 2019-02-12 LAB — VITAMIN D 25 HYDROXY (VIT D DEFICIENCY, FRACTURES): Vit D, 25-Hydroxy: <4 ng/mL — ABNORMAL LOW (ref 30.0–100.0)

## 2019-02-12 LAB — CBC
HCT: 34.6 % — ABNORMAL LOW (ref 36.0–46.0)
Hemoglobin: 10.4 g/dL — ABNORMAL LOW (ref 12.0–15.0)
MCH: 28.5 pg (ref 26.0–34.0)
MCHC: 30.1 g/dL (ref 30.0–36.0)
MCV: 94.8 fL (ref 80.0–100.0)
NRBC: 1.3 % — AB (ref 0.0–0.2)
Platelets: 326 10*3/uL (ref 150–400)
RBC: 3.65 MIL/uL — ABNORMAL LOW (ref 3.87–5.11)
RDW: 16.4 % — ABNORMAL HIGH (ref 11.5–15.5)
WBC: 9.3 10*3/uL (ref 4.0–10.5)

## 2019-02-12 LAB — VANCOMYCIN, RANDOM: Vancomycin Rm: 18

## 2019-02-12 MED ORDER — TECHNETIUM TC 99M DIETHYLENETRIAME-PENTAACETIC ACID
32.4000 | Freq: Once | INTRAVENOUS | Status: AC | PRN
  Administered 2019-02-12: 32.4 via RESPIRATORY_TRACT

## 2019-02-12 MED ORDER — DELFLEX-LC/4.25% DEXTROSE 483 MOSM/L IP SOLN: INTRAPERITONEAL | Status: DC

## 2019-02-12 MED ORDER — GENTAMICIN SULFATE 0.1 % EX CREA: 1.0000 "application " | TOPICAL_CREAM | Freq: Every day | CUTANEOUS | Status: DC

## 2019-02-12 MED ORDER — TECHNETIUM TO 99M ALBUMIN AGGREGATED
4.3000 | Freq: Once | INTRAVENOUS | Status: AC | PRN
  Administered 2019-02-12: 4.3 via INTRAVENOUS

## 2019-02-12 MED ORDER — HEPARIN 1000 UNIT/ML FOR PERITONEAL DIALYSIS: 500.0000 [IU] | INTRAMUSCULAR | Status: DC | PRN

## 2019-02-12 MED ORDER — METRONIDAZOLE 0.75 % VA GEL
1.0000 | Freq: Every day | VAGINAL | Status: DC
  Administered 2019-02-12: 1 via VAGINAL
  Filled 2019-02-12: qty 70

## 2019-02-12 MED ORDER — VANCOMYCIN HCL 500 MG IV SOLR
500.0000 mg | Freq: Once | INTRAVENOUS | Status: AC
  Administered 2019-02-12: 500 mg via INTRAVENOUS
  Filled 2019-02-12: qty 500

## 2019-02-12 MED ORDER — CLINDAMYCIN PHOSPHATE 2 % VA CREA: 1.0000 | TOPICAL_CREAM | Freq: Every day | VAGINAL | Status: DC

## 2019-02-12 MED ORDER — CLINDAMYCIN PHOSPHATE 2 % VA CREA
1.0000 | TOPICAL_CREAM | Freq: Every day | VAGINAL | Status: DC
  Filled 2019-02-12: qty 40

## 2019-02-12 NOTE — Progress Notes (Signed)
Inpatient Rehabilitation-Admissions Coordinator    Met with patient and her husband at the bedside to discuss team's recommendation for inpatient rehabilitation. Shared booklets, expectations while in CIR, expected length of stay, and anticipated functional level at DC. Both the patient and her husband want to pursue CIR at this time. AC will begin insurance authorization process for possible admit.   Will follow up once insurance determination made.   [name], OTR/L  Rehab Admissions Coordinator  [phone] 02/12/2019 2:48 PM

## 2019-02-12 NOTE — H&P (Signed)
Physical Medicine and Rehabilitation Admission H&P    Chief Complaint  Patient presents with  . Debility.     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  She was found to have bacterial peritonitis and started on IV antibiotics as well as IV pain meds.  Wet prep positive for bacterial vaginosis and she was started on Flagyl but this was DC'd due to ongoing issues with nausea.  Pelvic ultrasound revealed 2.8 cm left ovarian cyst--needs follow-up ultrasound in 6 to 12 weeks. On 2/11, she arrested requiring CODE BLUE and CPR to resuscitate past dose of IV Dilaudid.  She responded to Narcan and EKG without acute findings.  Abdominal pain improving and nausea and vomiting has resolved.  PD fluid now grossly clear and cultures negative so far.   Anemia of chronic disease to improve with 2 units PRBC.  She reported increase in abdominal pain with SOB on 2/17 therefore V/Q scan done and showed very low probability for PE. CXR done and was negative for pulmonary edema but patient with rales RLL and question of SOB due to fluid overload. May need CT abdomen if pain continues to be ongoing. Constipation has resolved.  Therapy ongoing and patient with significant balance deficits as well as orthostatic changes due to debility.  CIR recommended for follow-up therapy   Review of Systems  Constitutional: Negative for chills and fever.  HENT: Negative for hearing loss.   Eyes: Negative for blurred vision and double vision.  Respiratory: Negative for cough and shortness of breath.   Cardiovascular: Positive for chest pain (right chest wall pain). Negative for leg swelling.  Gastrointestinal: Positive for abdominal  pain (not as frequent now. Worse at nights with PD), nausea and vomiting.  Musculoskeletal: Positive for back pain (chronic).  Skin: Positive for itching (bilateral hands). Negative for rash.  Neurological: Positive for dizziness, sensory change (bilateral hand/feet numbness) and headaches.  Psychiatric/Behavioral: The patient has insomnia. The patient is not nervous/anxious.       Past Medical History:  Diagnosis Date  . Antiplatelet or antithrombotic long-term use: Plavix 06/30/2017  . B12 deficiency 05/10/2014  . Blood transfusion without reported diagnosis   . Chronic constipation   . CVA (cerebral vascular accident) ([place]), nonhemorrhagic, inferior right cerebellum 12/03/2017  . Cyst of left ovary 01/12/2018  . Diabetic neuropathy, painful ([place]), on low dose Gabapentin 07/13/2013  . DM (diabetes mellitus), type 2, uncontrolled, with renal complications ([place]) 83/15/1761  . DM2 (diabetes mellitus, type 2) ([place])   . Dysfunction of left eustachian tube, with pusatile tinnitus 11/24/2017  . Dyslipidemia associated with type 2 diabetes mellitus ([place]) 06/25/2016  . ESRD (end stage renal disease) on dialysis [facility])    On hemodialysis in July 2019 via [facility] then switched to [facility] in Nov 2019.   [name] ESRD with anemia ([place])   . Fibromyalgia   . Gastroparesis due to DM   . GERD (gastroesophageal reflux disease)   . Hypertension associated with diabetes ([place]) 02/19/2011  . IBS (irritable bowel syndrome)   . Left-sided weakness 12/10/2017   .  [name] Leiomyoma of uterus   . Pancreatitis   . Proliferative diabetic retinopathy ([place]) 09/29/2015  . 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  Reactive depression 12/10/2017   .  [name] Right-sided low back pain without sciatica 12/08/2015  . Secondary hyperthyroidism 11/27/2013  . Steal syndrome dialysis vascular access ([place]) 02/17/2018  . Thromboembolism ([place]) 04/05/2018  . Upper airway cough syndrome, with recs to stay off ACE and take Pepcid q hs 11/15/2016     Past Surgical History:  Procedure Laterality Date  . ARTERY REPAIR Right 02/17/2018   Procedure: EXPLORATION OF RIGHT BRACHIAL ARTERY;  Surgeon: Conrad Oak Hills, MD;  Location: [place];  Service: Vascular;  Laterality: Right;  . ARTERY REPAIR Right 02/17/2018   Procedure: BRACHIAL ARTERY EXPLORATION AND TRHOMBECTOMY;  Surgeon: Conrad Canadian, MD;  Location: [facility] OR;  Service: Vascular;  Laterality: Right;  . AV FISTULA PLACEMENT Left 05/09/2017   Procedure: INSERTION OF ARTERIOVENOUS (AV) GRAFT ARM (ARTEGRAFT);  Surgeon: Conrad Glasgow, MD;  Location: [facility] OR;  Service: Vascular;  Laterality: Left;  . AV FISTULA PLACEMENT Right 02/17/2018   Procedure: INSERTION OF ARTERIOVENOUS (AV) GORE-TEX GRAFT ARM RIGHT UPPER ARM;  Surgeon: Conrad Brownsboro, MD;  Location: [place];  Service: Vascular;  Laterality: Right;  . BASCILIC VEIN TRANSPOSITION Left 08/31/2016   Procedure: BASILIC VEIN TRANSPOSITION FIRST STAGE;  Surgeon: Conrad Meservey, MD;  Location: [place];  Service: Vascular;  Laterality: Left;  . CENTRAL VENOUS CATHETER INSERTION Left 07/24/2018   Procedure: INSERTION CENTRAL LINE ADULT;  Surgeon: Conrad Welaka, MD;  Location: [place];  Service: Vascular;  Laterality: Left;  . EYE SURGERY     secondary to diabetic retinopathy   . FOOT SURGERY Right    t"ook bone out- maybe hammer toe"  . INSERTION OF DIALYSIS CATHETER Left 07/24/2018   Procedure: INSERTION OF TUNNELED DIALYSIS CATHETER;  Surgeon: Conrad Cascades, MD;  Location: [place];  Service: Vascular;  Laterality: Left;  . IR FLUORO GUIDE CV LINE RIGHT  07/21/2018  . IR US GUIDE VASC ACCESS RIGHT  07/21/2018  . LIGATION OF ARTERIOVENOUS  FISTULA Left 05/09/2017   Procedure: LIGATION OF ARTERIOVENOUS  FISTULA;  Surgeon: Conrad Helper, MD;  Location: [place];  Service: Vascular;  Laterality: Left;  . LOOP RECORDER INSERTION N/A 12/05/2017   Procedure: LOOP RECORDER INSERTION;  Surgeon: Evans Lance, MD;  Location: [place] [facility];  Service: Cardiovascular;   Laterality: N/A;  . MYOMECTOMY    . REMOVAL OF GRAFT Right 02/17/2018   Procedure: REMOVAL OF RIGHT UPPER ARM ARTERIOVENOUS GRAFT;  Surgeon: Conrad Salina, MD;  Location: [place];  Service: Vascular;  Laterality: Right;  . REVISION OF ARTERIOVENOUS GORETEX GRAFT Left 07/24/2018   Procedure: REDO ARTERIOVENOUS GORETEX GRAFT;  Surgeon: Conrad Lyden, MD;  Location: [place];  Service: Vascular;  Laterality: Left;  . TEE WITHOUT CARDIOVERSION N/A 12/05/2017   Procedure: TRANSESOPHAGEAL ECHOCARDIOGRAM (TEE);  Surgeon: Sanda Klein, MD;  Location: [facility] ENDOSCOPY;  Service: Cardiovascular;  Laterality: N/A;  . UPPER EXTREMITY VENOGRAPHY Left 07/13/2018   Procedure: UPPER EXTREMITY VENOGRAPHY - Central & Left Arm;  Surgeon: Conrad Sayre, MD;  Location: [place] [facility];  Service: Cardiovascular;  Laterality: Left;  . UTERINE FIBROID SURGERY    . VITRECTOMY Bilateral     Family History  Problem Relation Age of Onset  . Colon polyps Mother   . Diabetes Mother   . Heart murmur Father        history essentially unknown  . Hypertension Father   . Diabetes Sister   . Kidney failure Sister  kidney transplant  . Diabetes Brother   . Retinal degeneration Brother   . Heart murmur Son   . Stroke Paternal Grandmother   . Diabetes Sister     Social History:  Married. Independent and works for [employer]. Uses cane out of home and furniture walks in the home. She reports that she has never smoked. She has never used smokeless tobacco. She reports that she does not drink alcohol or use drugs.    Allergies  Allergen Reactions  . Ibuprofen Other (See Comments)    CKD stage 3. Should avoid.  . Dilaudid [Hydromorphone Hcl] Itching and Other (See Comments)    Can take with Benadryl.  . Tramadol Itching   Medications Prior to Admission  Medication Sig Dispense Refill  . baclofen (LIORESAL) 10 MG tablet TAKE 1/2 TABLET BY MOUTH 2 (TWO) TIMES DAILY AS NEEDED FOR MUSCLE SPASMS. 90 tablet 1  .  carvedilol (COREG) 6.25 MG tablet Take 1 tablet (6.25 mg total) by mouth 2 (two) times daily with a meal. 60 tablet 6  . insulin aspart (NOVOLOG) 100 UNIT/ML injection Inject 6-8 Units into the skin 3 (three) times daily before meals. 8 UNITS WITH BREAKFAST, 6 UNITS WITH LUNCH, & 8 UNITS WITH SUPPER    . insulin degludec (TRESIBA FLEXTOUCH) 100 UNIT/ML SOPN FlexTouch Pen Inject 0.2 mLs (20 Units total) into the skin daily with lunch. (Patient taking differently: Inject 28 Units into the skin daily with lunch. ) 1 pen 3  . lidocaine-prilocaine (EMLA) cream Apply 1 application topically as directed. For Dialysis use  11  . lubiprostone (AMITIZA) 24 MCG capsule Take 1 capsule (24 mcg total) by mouth 2 (two) times daily with a meal. (Patient taking differently: Take 24 mcg by mouth 2 (two) times daily with a meal. As needed) 60 capsule 5  . oxyCODONE (ROXICODONE) 5 MG immediate release tablet Take 0.5 tablets (2.5 mg total) by mouth every 6 (six) hours as needed for severe pain. 20 tablet 0  . promethazine (PHENERGAN) 25 MG tablet Take 25 mg by mouth every 12 (twelve) hours as needed for nausea.    . simvastatin (ZOCOR) 10 MG tablet Take 0.5 tablets (5 mg total) by mouth at bedtime. 1/2 po q hs - GFR 14 45 tablet 1  . [EXPIRED] metroNIDAZOLE (FLAGYL) 500 MG tablet Take 1 tablet (500 mg total) by mouth 2 (two) times daily for 7 days. 14 tablet 0    Drug Regimen Review  Drug regimen was reviewed and remains appropriate with no significant issues identified  Home: Home Living Family/patient expects to be discharged to:: Private residence Living Arrangements: Spouse/significant other Available Help at Discharge: Family, Available PRN/intermittently Type of Home: House Home Access: Stairs to enter Technical brewer of Steps: 2 Entrance Stairs-Rails: None Home Layout: One level Bathroom Shower/Tub: Chiropodist: Standard Home Equipment: Environmental consultant - 2 wheels, Cane - single point    Functional History: Prior Function Level of Independence: Independent Comments: still working  Functional Status:  Mobility: Bed Mobility Overal bed mobility: Needs Assistance Bed Mobility: Supine to Sit Supine to sit: Mod assist, +2 for safety/equipment, HOB elevated General bed mobility comments: assist for trunk elevation, +2 for safety - use of bed rails and elevated HOB Transfers Overall transfer level: Needs assistance Equipment used: Rolling walker (2 wheeled) Transfers: Sit to/from Stand Sit to Stand: Mod assist, +2 physical assistance General transfer comment: Patient required modA +2 for lift assist to stand. Patient with dizziness in standing that subsided.  Ambulation/Gait Ambulation/Gait assistance: Mod assist, +2 physical assistance Gait Distance (Feet): 2 Feet Assistive device: 2 person hand held assist Gait Pattern/deviations: Step-to pattern, Decreased step length - right, Decreased step length - left, Decreased stride length General Gait Details: Patient required modA +2 for steadying with 2 person HHA to ambulate short distance to chair. Patient with significant unsteadiness requiring external support to maintain balance Gait velocity: decreased Gait velocity interpretation: <1.8 ft/sec, indicate of risk for recurrent falls    ADL: ADL Overall ADL's : Needs assistance/impaired Eating/Feeding: Modified independent, Sitting Grooming: Wash/dry hands, Wash/dry face, Oral care, Set up, Sitting Grooming Details (indicate cue type and reason): EOB, unable to maintain standing at this time Upper Body Bathing: Minimal assistance Lower Body Bathing: Minimal assistance, Sitting/lateral leans Upper Body Dressing : Minimal assistance Lower Body Dressing: Minimal assistance Lower Body Dressing Details (indicate cue type and reason): increased time, able to don/doff socks EOB - requires assist for items that are typically sit<>stand Toilet Transfer: Moderate assistance,  +2 for physical assistance, +2 for safety/equipment, Stand-pivot, BSC, RW Toileting- Clothing Manipulation and Hygiene: Maximal assistance, Sit to/from stand Toileting - Clothing Manipulation Details (indicate cue type and reason): Pt requires BUE for balance, peri care requires assist from external person Functional mobility during ADLs: Moderate assistance, +2 for safety/equipment, Rolling walker General ADL Comments: decreased activity tolerance,   Cognition: Cognition Overall Cognitive Status: Within Functional Limits for tasks assessed Orientation Level: Oriented X4 Cognition Arousal/Alertness: Awake/alert Behavior During Therapy: WFL for tasks assessed/performed Overall Cognitive Status: Within Functional Limits for tasks assessed   Blood pressure (!) 161/81, pulse 76, temperature (!) 97.5 F (36.4 C), temperature source Oral, resp. rate 18, height 5\' 9"  (1.753 m), weight 103.7 kg, last menstrual period 12/13/2018, SpO2 100 %. Physical Exam  Nursing note and vitals reviewed. Constitutional: She appears well-developed and well-nourished. No distress.  Fatigued appearing.   Neck: Normal range of motion.  Cardiovascular: Normal rate and regular rhythm. Exam reveals no friction rub.  No murmur heard. Respiratory: Effort normal and breath sounds normal. No respiratory distress. She has no wheezes.  GI: She exhibits no distension. There is abdominal tenderness.  Dry dressing on LLQ PD catheter   Musculoskeletal: Normal range of motion.  Neurological: [name] alert. No cranial nerve deficit. She exhibits normal muscle tone. Coordination normal.  Moves all 4's. UE 3/5 prox to 4/5 distally. LE 3/5 prox to distal. Decreased FMC RUE and RLE.  Decreased LT distally in LE's  Skin: [name] not diaphoretic.  Psychiatric: She has a normal mood and affect. Her behavior is normal. Judgment and thought content normal.    Results for orders placed or performed during the hospital encounter of  02/05/19 (from the past 48 hour(s))  Glucose, capillary     Status: Abnormal   Collection Time: 02/11/19  4:47 PM  Result Value Ref Range   Glucose-Capillary 116 (H) 70 - 99 mg/dL  Vitamin B12     Status: None   Collection Time: 02/11/19  6:12 PM  Result Value Ref Range   Vitamin B-12 573 180 - 914 pg/mL    Comment: (NOTE) This assay is not validated for testing neonatal or myeloproliferative syndrome specimens for Vitamin B12 levels. Performed at [facility], [place] [address], [place], [place] [address]   VITAMIN D 25 Hydroxy (Vit-D Deficiency, Fractures)     Status: Abnormal   Collection Time: 02/11/19  6:12 PM  Result Value Ref Range   Vit D, 25-Hydroxy <4.0 (L)  30.0 - 100.0 ng/mL    Comment: (NOTE) Vitamin D deficiency has been defined by the [place] practice guideline as a level of serum 25-OH vitamin D less than 20 ng/mL (1,2). The Endocrine Society went on to further define vitamin D insufficiency as a level between 21 and 29 ng/mL (2). 1. IOM (Institute of Medicine). 2010. Dietary reference   intakes for calcium and D. [place]: The   [employer]. 2. Holick MF, Binkley [place], Bischoff-Ferrari HA, et al.   Evaluation, treatment, and prevention of vitamin D   deficiency: an Endocrine Society clinical practice   guideline. JCEM. 2011 Jul; 96(7):1911-30. Performed At: [facility] [address], [place] [id number] Rush Farmer MD HE:[id number]   Glucose, capillary     Status: Abnormal   Collection Time: 02/11/19 10:32 PM  Result Value Ref Range   Glucose-Capillary 173 (H) 70 - 99 mg/dL  Vancomycin, random     Status: None   Collection Time: 02/12/19  6:21 AM  Result Value Ref Range   Vancomycin Rm 18     Comment:        Random Vancomycin therapeutic range is dependent on dosage and time of specimen collection. A peak range is 20.0-40.0 ug/mL A trough range is 5.0-15.0 ug/mL          Performed at [facility] [address] [place], [place] [address]   CBC     Status: Abnormal   Collection Time: 02/12/19  6:21 AM  Result Value Ref Range   WBC 9.3 4.0 - 10.5 K/uL   RBC 3.65 (L) 3.87 - 5.11 MIL/uL   Hemoglobin 10.4 (L) 12.0 - 15.0 g/dL   HCT 34.6 (L) 36.0 - 46.0 %   MCV 94.8 80.0 - 100.0 fL   MCH 28.5 26.0 - 34.0 pg   MCHC 30.1 30.0 - 36.0 g/dL   RDW 16.4 (H) 11.5 - 15.5 %   Platelets 326 150 - 400 K/uL   nRBC 1.3 (H) 0.0 - 0.2 %    Comment: Performed at [facility] [address] [place], [place] [address]  Glucose, capillary     Status: Abnormal   Collection Time: 02/12/19  7:21 AM  Result Value Ref Range   Glucose-Capillary 257 (H) 70 - 99 mg/dL  Glucose, capillary     Status: Abnormal   Collection Time: 02/12/19 11:13 AM  Result Value Ref Range   Glucose-Capillary 221 (H) 70 - 99 mg/dL  Glucose, capillary     Status: Abnormal   Collection Time: 02/12/19  2:38 PM  Result Value Ref Range   Glucose-Capillary 105 (H) 70 - 99 mg/dL  Glucose, capillary     Status: Abnormal   Collection Time: 02/12/19  5:09 PM  Result Value Ref Range   Glucose-Capillary 145 (H) 70 - 99 mg/dL  Glucose, capillary     Status: Abnormal   Collection Time: 02/12/19 10:22 PM  Result Value Ref Range   Glucose-Capillary 177 (H) 70 - 99 mg/dL  Glucose, capillary     Status: Abnormal   Collection Time: 02/13/19  7:16 AM  Result Value Ref Range   Glucose-Capillary 231 (H) 70 - 99 mg/dL  Renal function panel     Status: Abnormal   Collection Time: 02/13/19 10:38 AM  Result Value Ref Range   Sodium 139 135 - 145 mmol/L   Potassium 3.2 (L) 3.5 - 5.1 mmol/L   Chloride 100 98 -  111 mmol/L   CO2 23 22 - 32 mmol/L   Glucose, Bld 114 (H) 70 - 99 mg/dL   BUN 34 (H) 6 - 20 mg/dL   Creatinine, Ser 7.93 (H) 0.44 - 1.00 mg/dL   Calcium 8.3 (L) 8.9 - 10.3 mg/dL   Phosphorus 4.7 (H) 2.5 - 4.6 mg/dL   Albumin 2.2 (L) 3.5 - 5.0 g/dL   GFR calc non Af Amer 5 (L) >60 mL/min    GFR calc Af Amer 6 (L) >60 mL/min   Anion gap 16 (H) 5 - 15    Comment: Performed at [facility] [address] [place],  [address]  Glucose, capillary     Status: Abnormal   Collection Time: 02/13/19 11:22 AM  Result Value Ref Range   Glucose-Capillary 55 (L) 70 - 99 mg/dL  Glucose, capillary     Status: Abnormal   Collection Time: 02/13/19 12:38 PM  Result Value Ref Range   Glucose-Capillary 117 (H) 70 - 99 mg/dL   Dg Chest 2 View  Result Date: 02/12/2019 CLINICAL DATA:  Shortness of breath and right chest pain. Nausea and vomiting 3 days ago. EXAM: CHEST - 2 VIEW COMPARISON:  07/24/2017 and thoracic spine 12/18/2018 FINDINGS: Lungs are adequately inflated and otherwise clear. Cardiomediastinal silhouette is within normal. Stable mild compression fracture over the midthoracic spine. IMPRESSION: No active cardiopulmonary disease. Electronically Signed   By: Marin Olp M.D.   On: 02/12/2019 11:13   Nm Pulmonary Perf And Vent  Result Date: 02/12/2019 CLINICAL DATA:  Chest pain and shortness of breath EXAM: NUCLEAR MEDICINE VENTILATION - PERFUSION LUNG SCAN VIEWS: Anterior, posterior, left lateral, right lateral, RAO, LAO, RPO, LPO-ventilation and perfusion RADIOPHARMACEUTICALS:  32.4 mCi of Tc-71m DTPA aerosol inhalation and 4.3 mCi Tc35m MAA IV COMPARISON:  Chest radiograph February 12, 2019 FINDINGS: Ventilation: Radiotracer uptake is homogeneous and symmetric bilaterally. No ventilation defects are evident. Perfusion: Radiotracer uptake is homogeneous and symmetric bilaterally. No perfusion defects are evident. IMPRESSION: No appreciable ventilation or perfusion defects. Very low probability of pulmonary embolus. Electronically Signed   By: Lowella Grip III M.D.   On: 02/12/2019 14:26      Medical Problem List and Plan: 1.  Functional decline secondary to debility after PD relarted peritonitis/sepsis  -admit to inpatient rehab 2.  DVT Prophylaxis/Anticoagulation:  Pharmaceutical: Heparin 3. Chronic back pain/Pain Management: No baclofen or muscle relaxers per Nephrology. Continue Tramadol as needed 4. Mood: LCSW to follow for evaluation and support 5. Neuropsych: This patient is capable of making decisions on her own behalf. 6. Skin/Wound Care: Routine pressure relief measures. 7. Fluids/Electrolytes/Nutrition: Monitor I's and O's.  Check weights daily 8. ESRD: Continue PD in the evenings.  Nephrology to follow for PD assistance/management and correction of electrolyte abnormalities. [name]  9.  PD associated peritonitis: Vancomycin/Fortaz initiated 2/08 at [facility] ED--to continue for 14 total days.  .   10.  T2DM:  Decreased 2/18 due to hypoglycemia. Continue Lantus (on Tresiba at home) at lunch with meal coverage as well as sliding scale insulin for tighter blood sugar control.  Monitor blood sugars AC at bedtime.  11.  Chronic constipation: Encourage compliance with bowel program.  12. Anemia of chronic disease: On Arnaesp weekly.  Continue to monitor with serial checks. Monitor stool guaiacs.  13. Neuropathy: Resume lower dose gabapentin 100 mg bid.  14. Bacterial vaginosis: On flagyl D#2/5 15. HTN: Monitor BP bid. On coreg bid.  16. Persistent Hypokalemia: Will add two doses Kdur.  Bary Leriche, PA-C 02/13/2019

## 2019-02-12 NOTE — Progress Notes (Signed)
OT Cancellation Note  Patient Details Name: Heather [name] MRN: [id number] DOB: 16-Oct-1971   Cancelled Treatment:    Reason Eval/Treat Not Completed: Patient at procedure or test/ unavailable. Pt off unit at radiology, will check back next appropriate/available time  [name] 02/12/2019, 1:54 PM

## 2019-02-12 NOTE — Progress Notes (Signed)
PT Cancellation Note  Patient Details Name: Heather [name] MRN: [id number] DOB: [birth year]   Cancelled Treatment:    Reason Eval/Treat Not Completed: Patient at procedure or test/unavailable. Pt currently off unit at radiology. Will continue to follow and progress as able per POC.    Thelma Comp 02/12/2019, 2:49 PM  Rolinda Roan, PT, DPT Acute Rehabilitation Services Pager: [phone] Office: [phone]

## 2019-02-12 NOTE — Progress Notes (Signed)
Inpatient Diabetes Program Recommendations  AACE/ADA: New Consensus Statement on Inpatient Glycemic Control (2015)  Target Ranges:  Prepandial:   less than 140 mg/dL      Peak postprandial:   less than 180 mg/dL (1-2 hours)      Critically ill patients:  140 - 180 mg/dL   Results for Heather [name], Heather [name] (MRN [id number]) as of 02/12/2019 09:27  Ref. Range 02/11/2019 07:10 02/11/2019 11:47 02/11/2019 16:47 02/11/2019 22:32 02/12/2019 07:21  Glucose-Capillary Latest Ref Range: 70 - 99 mg/dL 253 (H) 155 (H) 116 (H) 173 (H) 257 (H)   Review of Glycemic Control  Diabetes history: DM 2 Outpatient Diabetes medications: Tresiba 28 units, Novolog 8 units breakfast, 6 units lunch, 8 units supper Current orders for Inpatient glycemic control: Lantus 28, Novolog 8 units breakfast, 6 units lunch, 8 units supper, Novolog 0-15 units tid  A1c 9.9%  Inpatient Diabetes Program Recommendations:    Fasting glucose trends elevated in the 200's. Consider increasing Lantus to 30 units and due to renal function decrease correction scale to 0-9, sensitive tid.  Thanks,  Tama Headings RN, MSN, BC-ADM Inpatient Diabetes Coordinator Team Pager [phone] (8a-5p)

## 2019-02-12 NOTE — Progress Notes (Signed)
Pharmacy Antibiotic Note  Heather [name] is a 48 y.o. female on Vancomycin and Ceftazidime for PD catheter-related peritonitis.  Day #10 antibiotics, begun at [facility] ED on 2/8.    Random Vancomycin level today is 18 mcg/ml.  Re-dosing planned with level <20 mcg/ml.   Discussed with Angelica Chessman, Nephrology NP.  14 days of antibiotics recommended.  Plan:  Vancomycin 500 mg IV x 1 today.  Continue Ceftazidime 1gm IV q24hrs.  Follow up final culture, clinical progress.  Will check random Vanc level in 3 days.    Weight: 229 lb 8 oz (104.1 kg)(stood to scale )  Temp (24hrs), Avg:98.1 F (36.7 C), Min:98 F (36.7 C), Max:98.3 F (36.8 C)  Recent Labs  Lab 02/05/19 1628 02/05/19 1700  02/06/19 0538  02/07/19 0625 02/08/19 0435 02/09/19 0935 02/10/19 0646 02/11/19 0854 02/12/19 0621  WBC  --   --    < > 7.8  --  6.3 8.8  --  8.4 10.0 9.3  CREATININE 10.68*  --   --  10.38*  --  9.58*  --   --  7.66* 7.59*  --   LATICACIDVEN  --  1.5  --   --   --  1.1  --   --   --   --   --   VANCORANDOM  --   --   --   --    < > 17  --  19  --   --  18   < > = values in this interval not displayed.      Allergies  Allergen Reactions  . Ibuprofen Other (See Comments)    CKD stage 3. Should avoid.  . Dilaudid [Hydromorphone Hcl] Itching and Other (See Comments)    Can take with Benadryl.  . Tramadol Itching    Antimicrobials this admission: Vancomycin 2/8 (PTA at [facility] ED) >> Cefepime x 1 on 2/8 (at [facility] ED) Ceftazidime 2/11 >> Metronidazole 2/8 (PTA) >>2/13 (for bacterial vaginosis)  Dose adjustments this admission:  2/8 Vanc 2gm at [facility] ED  2/10 VR 23 mcg/ml  2/11 VR 20 mcg/ml  2/12 VR 17 mcg/ml > 750 mg IV   2/14 VR 19 mcg/ml > 500 mg IV x 1   2/17 VR 18 mcg/ml > 500 mg IV x 1  Microbiology results:   2/13 peritoneal fluid - ng x 3 days to date  Thank you for allowing pharmacy to be a part of this patient's care.  Arty Baumgartner, [place] Pager: [phone] or phone:L  [phone] 02/12/2019 12:44 PM

## 2019-02-12 NOTE — Progress Notes (Signed)
PROGRESS NOTE    Heather [name]  XMI:[id number] DOB: [birth year] DOA: 02/05/2019 PCP: Briscoe Deutscher, DO    Brief Narrative: 48 year old with past medical history significant for type 2 diabetes, end-stage renal disease on peritoneal dialysis since July 2019, chronic constipation, nonhemorrhagic cerebrovascular accident, proliferative diabetic retinopathy who presents complaining of worsening left lower abdominal pain.  The pain is worse when she tries to do peritoneal dialysis.  She was able to complete dialysis 4 days ago.  She also reports nausea vomiting.  She was a started on metronidazole for bacterial vaginosis.  Patient on February 11 became unresponsive after 2 mg of IV Dilaudid.  CODE BLUE was called.  Patient received CPR.  After Narcan patient regained consciousness.   Assessment & Plan:   Principal Problem:   Peritonitis, dialysis-associated ([place]) Active Problems:   Diabetes mellitus, type II, insulin dependent ([place]), on CGM, with end organ damage: renal, neuropathy, gastroparesis, retinopathy   Hypertension associated with diabetes ([place])   Gastroparesis due to DM ([place])   Secondary hyperparathyroidism ([place])   CKD (chronic kidney disease) stage 5, GFR less than 15 ml/min ([facility])   Peritoneal dialysis catheter in place [facility])   Peritonitis, dialysis-associated, initial encounter ([place])   Left ovarian cyst   1-Peritonitis likely related to PD catheter; CT abdomen on presentation did show decrease free air in the abdomen with only tiny focus remaining adjacent to the liver. Hemorrhagic cyst in the left ovary. Peritoneal fluid culture no growth today.  White blood cell 210 with 81% neutrophils. Peritoneal catheter now functioning. Continue with IV antibiotics.  At discharge they will do antibiotics intraperitoneal. Zofran PRN Abdominal Pain improved.   2-Loss of consciousness, respiratory arrest: After 2 mg of IV Dilaudid patient became unresponsive. CPR was a started.   After Narcan patient regained consciousness. Discontinue IV Dilaudid. Monitor on oral tramadol. ECHO normal EF  Chest pain, right side, SOB;  Pain can be related to chest compression that she received but she is also complaining of SOB. If chest x ary negative for pulmonary edema, will discuss with renal regarding getting CT angio vs VQ scan.   Anemia, of chronic diseases;  received 2 units PRBC.  Hb stable.   End-stage renal disease on peritoneal dialysis Nephrology managing.  Insulin-dependent diabetic: Continue with insulin.  Hypertension; blood pressure stable.  Bacterial Vaginosis: hold flagyl due to vomiting.   Left ovarian cyst; pelvic ultrasound showed 2.8 cm mildly complex left ovarian cyst.  Patient will need ultrasound in 6 to 12 weeks preferably during the week following patient's normal menses.   Hypokalemia; will defer to nephrology  Constipation; lactulose.    Estimated body mass index is 33.89 kg/m as calculated from the following:   Height as of 02/03/19: 5\' 9"  (1.753 m).   Weight as of this encounter: 104.1 kg.   DVT prophylaxis: Heparin Code Status: Full code Family Communication: Care discussed with patient and family who was at bedside Disposition Plan: poor oral intake. Cir consulted, awaiting insurance approval.   Consultants:   Nephrology   Procedures:   Echo   Antimicrobials:  Fortaz 2-11 Vancomycin 2-11  Subjective: She report improvement of abdominal pain. Eating more.  [name] complaining of dyspnea when lying down flat.  She has been having chest pain right side, below breast, pleuritic. She has been having pain since after chest compression.   Objective: Vitals:   02/11/19 2225 02/12/19 0500 02/12/19 1000 02/12/19 1111  BP: 131/78 120/75 (!) 151/85 137/71  Pulse: 78 75  80 79  Resp: 19 18 18 20   Temp: 98.2 F (36.8 C) 98.2 F (36.8 C) 98 F (36.7 C) 98.1 F (36.7 C)  TempSrc: Oral Oral Oral Oral  SpO2: 98% 94% 100% 100%    Weight: 111.5 kg  104.1 kg     Intake/Output Summary (Last 24 hours) at 02/12/2019 1145 Last data filed at 02/12/2019 0600 Gross per 24 hour  Intake 120 ml  Output 0 ml  Net 120 ml   Filed Weights   02/11/19 1824 02/11/19 2225 02/12/19 1000  Weight: 110.3 kg 111.5 kg 104.1 kg    Examination:  General exam: NAD Respiratory system: Bilateral crackles.  Cardiovascular system; S 1, S 2 RRR Gastrointestinal system: BS present,soft,  Central nervous system; non focal.  Extremities:  Symmetric power.     Data Reviewed: I have personally reviewed following labs and imaging studies  CBC: Recent Labs  Lab 02/07/19 0625 02/08/19 0435 02/10/19 0646 02/11/19 0854 02/12/19 0621  WBC 6.3 8.8 8.4 10.0 9.3  NEUTROABS 4.4  --   --   --   --   HGB 7.4* 7.5* 7.4* 10.6* 10.4*  HCT 24.2* 24.1* 24.1* 34.6* 34.6*  MCV 95.3 96.0 95.3 93.5 94.8  PLT 266 301 331 317 734   Basic Metabolic Panel: Recent Labs  Lab 02/05/19 1628 02/06/19 0538 02/07/19 0625 02/10/19 0646 02/11/19 0854  NA  --  133* 131* 133* 135  K  --  4.0 3.5 3.0* 3.1*  CL  --  98 97* 99 99  CO2  --  19* 24 24 24   GLUCOSE  --  246* 227* 176* 230*  BUN  --  69* 61* 41* 39*  CREATININE 10.68* 10.38* 9.58* 7.66* 7.59*  CALCIUM  --  7.1* 6.8* 7.0* 7.6*  PHOS  --   --   --  5.1*  --    GFR: Estimated Creatinine Clearance: 11.5 mL/min (A) (by C-G formula based on SCr of 7.59 mg/dL (H)). Liver Function Tests: Recent Labs  Lab 02/07/19 0625  AST 31  ALT 31  ALKPHOS 106  BILITOT 0.6  PROT 6.2*  ALBUMIN 2.0*   No results for input(s): LIPASE, AMYLASE in the last 168 hours. No results for input(s): AMMONIA in the last 168 hours. Coagulation Profile: No results for input(s): INR, PROTIME in the last 168 hours. Cardiac Enzymes: No results for input(s): CKTOTAL, CKMB, CKMBINDEX, TROPONINI in the last 168 hours. BNP (last 3 results) Recent Labs    04/11/18 1421  PROBNP 65.0   HbA1C: No results for input(s):  HGBA1C in the last 72 hours. CBG: Recent Labs  Lab 02/11/19 1147 02/11/19 1647 02/11/19 2232 02/12/19 0721 02/12/19 1113  GLUCAP 155* 116* 173* 257* 221*   Lipid Profile: No results for input(s): CHOL, HDL, LDLCALC, TRIG, CHOLHDL, LDLDIRECT in the last 72 hours. Thyroid Function Tests: No results for input(s): TSH, T4TOTAL, FREET4, T3FREE, THYROIDAB in the last 72 hours. Anemia Panel: Recent Labs    02/11/19 1812  VITAMINB12 573   Sepsis Labs: Recent Labs  Lab 02/05/19 1700 02/07/19 0625  LATICACIDVEN 1.5 1.1    Recent Results (from the past 240 hour(s))  Blood culture (routine x 2)     Status: None   Collection Time: 02/03/19  8:15 PM  Result Value Ref Range Status   Specimen Description   Final    BLOOD BLOOD LEFT FOREARM Performed at [facility], [address] [place], [place] [address]    Special  Requests   Final    BOTTLES DRAWN AEROBIC AND ANAEROBIC Blood Culture adequate volume Performed at [facility], [address] [place], [place] [address]    Culture   Final    NO GROWTH 5 DAYS Performed at [facility], [place] [address] [place], [place] [address]    Report Status 02/08/2019 FINAL  Final  Wet prep, genital     Status: Abnormal   Collection Time: 02/03/19  8:45 PM  Result Value Ref Range Status   Yeast Wet Prep HPF POC NONE SEEN NONE SEEN Final   Trich, Wet Prep NONE SEEN NONE SEEN Final   Clue Cells Wet Prep HPF POC PRESENT (A) NONE SEEN Final   WBC, Wet Prep HPF POC NONE SEEN NONE SEEN Final   Sperm NONE SEEN  Final    Comment: Performed at [facility], [address] [place], [place] [address]  Body fluid culture     Status: None   Collection Time: 02/03/19  9:10 PM  Result Value Ref Range Status   Specimen Description   Final    PERITONEAL DIALYSATE Performed at [facility], [address] [place], [place] [address]    Special Requests   Final    NONE Performed at [facility], [address] [place], [place] [address]    Gram Stain   Final    FEW WBC PRESENT, PREDOMINANTLY PMN NO ORGANISMS SEEN    Culture   Final    NO GROWTH 3 DAYS Performed at [facility], [place] [address], [place], [place] [address]    Report Status 02/07/2019 FINAL  Final  Culture, body fluid-bottle     Status: None (Preliminary result)   Collection Time: 02/08/19 10:50 AM  Result Value Ref Range Status   Specimen Description PERITONEAL DIALYSATE  Final   Special Requests NONE  Final   Culture   Final    NO GROWTH 3 DAYS Performed at [place] [address] [place], [place] [address]    Report Status PENDING  Incomplete  Gram stain     Status: None   Collection Time: 02/08/19 10:50 AM  Result Value Ref Range Status   Specimen Description PERITONEAL DIALYSATE  Final   Special Requests NONE  Final   Gram Stain   Final    WBC PRESENT,BOTH PMN AND MONONUCLEAR NO ORGANISMS SEEN CYTOSPIN SMEAR Performed at [facility], [address], [place], [place] [address]    Report Status 02/08/2019 FINAL  Final         Radiology Studies: Dg Chest 2 View  Result Date: 02/12/2019 CLINICAL DATA:  Shortness of breath and right chest pain. Nausea and vomiting 3 days ago. EXAM: CHEST - 2 VIEW COMPARISON:  07/24/2017 and thoracic spine 12/18/2018 FINDINGS: Lungs are adequately inflated and otherwise clear. Cardiomediastinal silhouette is within normal. Stable mild compression fracture over the midthoracic spine. IMPRESSION: No active cardiopulmonary disease. Electronically Signed   By: Marin Olp M.D.   On: 02/12/2019 11:13        Scheduled Meds: . carvedilol  6.25 mg Oral BID WC  . darbepoetin (ARANESP) injection - NON-DIALYSIS  60 mcg Subcutaneous Q Wed-1800  . feeding supplement (PRO-STAT SUGAR FREE 64)  30 mL Oral BID  . gentamicin cream  1 application Topical Daily  . heparin  5,000 Units Subcutaneous Q8H  . insulin aspart  0-15 Units  Subcutaneous TID WC  . insulin aspart  6 Units Subcutaneous Q lunch  . insulin aspart  8 Units Subcutaneous Q breakfast  . insulin aspart  8 Units Subcutaneous Q supper  . insulin glargine  28 Units Subcutaneous Q lunch  . lidocaine-prilocaine  1 application Topical UD  . polyethylene glycol  17 g Oral Daily  . senna-docusate  1 tablet Oral BID  . simvastatin  5 mg Oral QHS  . sodium chloride flush  3 mL Intravenous Q12H  . vancomycin variable dose per unstable renal function (pharmacist dosing)   Does not apply See admin instructions   Continuous Infusions: . cefTAZidime (FORTAZ)  IV 1 g (02/11/19 1333)  . dialysis solution 4.25% low-MG/low-CA       LOS: 7 days    Time spent: 35 minutes.     Elmarie Shiley, MD [facility]  02/12/2019, 11:45 AM

## 2019-02-12 NOTE — Progress Notes (Addendum)
[facility] Progress Note   Subjective:  Seen in room. Having SOB when in PD dwell session (when abd full) - unable to lay flat, feels need to sit upright. No abd pain or CP. Used all 4.25% fluid overnight - net UF less than 2L.  Objective Vitals:   02/11/19 1824 02/11/19 2225 02/12/19 0500 02/12/19 1000  BP: (!) 168/93 131/78 120/75 (!) 151/85  Pulse: 79 78 75 80  Resp: (!) 24 19 18 18   Temp: 98 F (36.7 C) 98.2 F (36.8 C) 98.2 F (36.8 C) 98 F (36.7 C)  TempSrc: Oral Oral Oral Oral  SpO2: 100% 98% 94% 100%  Weight: 110.3 kg 111.5 kg  104.1 kg   Physical Exam General: Well appearing woman, NAD Heart: RRR; no murmur Lungs: CTA in upper lobes, dull at bases without overt rales Abdomen: soft, non-tender. PD cath without tenderness Extremities: No LE edema  Additional Objective Labs: Basic Metabolic Panel: Recent Labs  Lab 02/07/19 0625 02/10/19 0646 02/11/19 0854  NA 131* 133* 135  K 3.5 3.0* 3.1*  CL 97* 99 99  CO2 24 24 24   GLUCOSE 227* 176* 230*  BUN 61* 41* 39*  CREATININE 9.58* 7.66* 7.59*  CALCIUM 6.8* 7.0* 7.6*  PHOS  --  5.1*  --    Liver Function Tests: Recent Labs  Lab 02/07/19 0625  AST 31  ALT 31  ALKPHOS 106  BILITOT 0.6  PROT 6.2*  ALBUMIN 2.0*   CBC: Recent Labs  Lab 02/07/19 0625 02/08/19 0435 02/10/19 0646 02/11/19 0854 02/12/19 0621  WBC 6.3 8.8 8.4 10.0 9.3  NEUTROABS 4.4  --   --   --   --   HGB 7.4* 7.5* 7.4* 10.6* 10.4*  HCT 24.2* 24.1* 24.1* 34.6* 34.6*  MCV 95.3 96.0 95.3 93.5 94.8  PLT 266 301 331 317 326   Blood Culture    Component Value Date/Time   SDES PERITONEAL DIALYSATE 02/08/2019 1050   SDES PERITONEAL DIALYSATE 02/08/2019 1050   SPECREQUEST NONE 02/08/2019 1050   SPECREQUEST NONE 02/08/2019 1050   CULT  02/08/2019 1050    NO GROWTH 3 DAYS Performed at [facility], [place] [address], [place], [place] [address]    REPTSTATUS PENDING 02/08/2019 1050   REPTSTATUS 02/08/2019 FINAL  02/08/2019 1050   Medications: . cefTAZidime (FORTAZ)  IV 1 g (02/11/19 1333)  . dialysis solution 4.25% low-MG/low-CA    . dialysis solution 4.25% low-MG/low-CA     . carvedilol  6.25 mg Oral BID WC  . darbepoetin (ARANESP) injection - NON-DIALYSIS  60 mcg Subcutaneous Q Wed-1800  . feeding supplement (PRO-STAT SUGAR FREE 64)  30 mL Oral BID  . gentamicin cream  1 application Topical Daily  . gentamicin cream  1 application Topical Daily  . gentamicin cream  1 application Topical Daily  . heparin  5,000 Units Subcutaneous Q8H  . insulin aspart  0-15 Units Subcutaneous TID WC  . insulin aspart  6 Units Subcutaneous Q lunch  . insulin aspart  8 Units Subcutaneous Q breakfast  . insulin aspart  8 Units Subcutaneous Q supper  . insulin glargine  28 Units Subcutaneous Q lunch  . lidocaine-prilocaine  1 application Topical UD  . polyethylene glycol  17 g Oral Daily  . senna-docusate  1 tablet Oral BID  . simvastatin  5 mg Oral QHS  . sodium chloride flush  3 mL Intravenous Q12H  . vancomycin variable dose per unstable renal function (pharmacist dosing)  Does not apply See admin instructions    Dialysis Orders: CCPD 7X/week with5 exchanges overnight,2.5L fill volume, dwell time 1.5 hrs, no day bag or pause, EDW 99kg edw. Typically uses combo of 2.5% and 4.25% fluid. - Mircera IM 50mg  (last given 11/28/18)  Assessment/Plan: 1. PD cath related peritonitis: 1st episode. Cell count -1st peritonitis episode, initial cell count 210 (81% neutrophils), PD fluid Cx 2/13 negative. On IV Fortaz/Vancomycin. Will have outpatient PD team review sterile techniques. She was increased to 2.5L dwells 1 mo ago, but says she cannot tolerate > SOB. Changed down to 2.25L on 2/16 - still with same issue. Question fluid overload - not much UF overnight (<2L) considering used all 4.25% - will try again tonight. Ordered CXR to assess for pulm edema.  2. Debility: Transferring to CIR soon? 3. ESRD: Continue  CCPD. See discussion above, likely overloaded - continue 4.25% for all exchanges. Per weight, she is 5.5kg above EDW. CXR pending. 4. HTN/volume: Increase UF as tolerated. 5. Anemia of CKD: Hgb 10.4, s/p 2U PRBCs 2/15. Continue Aranesp weekly.  6. Metabolic Bone Disease: Corr Ca/Phos controlled, no binders/VDRA.  7. Nutrition: Alb low, continue pro-stat supplement. 8. Hx CVA 9. Uncontrolled T2DM: Hgb A1C 9.9%, per primary  10. Hx respiratory arrest: In setting of iatrogenic narcotic overdose, resolved with narcan.  Veneta Penton, PA-C 02/12/2019, 11:12 AM  [facility] Pager: [phone]  Pt seen, examined and agree w assess/plan as above with additions as indicated. Sig SOB and orthopnea, CXR today and V/Q are both negative. Plan cont PD , will lower dwell volume to 2000 cc w PD tonight and cont 4.25% to get vol down further.  [facility] 02/12/2019, 3:37 PM

## 2019-02-13 ENCOUNTER — Inpatient Hospital Stay (HOSPITAL_COMMUNITY)
Admission: RE | Admit: 2019-02-13 | Discharge: 2019-02-24 | DRG: 945 | Disposition: A | Discharge status: Home Health | Payer: Managed Care, Other (non HMO) | Source: Intra-hospital | Attending: Physical Medicine & Rehabilitation | Admitting: Physical Medicine & Rehabilitation

## 2019-02-13 ENCOUNTER — Other Ambulatory Visit: Payer: Self-pay

## 2019-02-13 ENCOUNTER — Encounter (HOSPITAL_COMMUNITY): Payer: Self-pay | Admitting: *Deleted

## 2019-02-13 DIAGNOSIS — K659 Peritonitis, unspecified: Secondary | ICD-10-CM | POA: Diagnosis not present

## 2019-02-13 DIAGNOSIS — M797 Fibromyalgia: Secondary | ICD-10-CM | POA: Diagnosis present

## 2019-02-13 DIAGNOSIS — N186 End stage renal disease: Secondary | ICD-10-CM | POA: Diagnosis present

## 2019-02-13 DIAGNOSIS — E538 Deficiency of other specified B group vitamins: Secondary | ICD-10-CM | POA: Diagnosis present

## 2019-02-13 DIAGNOSIS — K3184 Gastroparesis: Secondary | ICD-10-CM | POA: Diagnosis present

## 2019-02-13 DIAGNOSIS — E113599 Type 2 diabetes mellitus with proliferative diabetic retinopathy without macular edema, unspecified eye: Secondary | ICD-10-CM | POA: Diagnosis present

## 2019-02-13 DIAGNOSIS — I150 Renovascular hypertension: Secondary | ICD-10-CM | POA: Diagnosis not present

## 2019-02-13 DIAGNOSIS — Z886 Allergy status to analgesic agent status: Secondary | ICD-10-CM

## 2019-02-13 DIAGNOSIS — K581 Irritable bowel syndrome with constipation: Secondary | ICD-10-CM | POA: Diagnosis present

## 2019-02-13 DIAGNOSIS — N760 Acute vaginitis: Secondary | ICD-10-CM | POA: Diagnosis present

## 2019-02-13 DIAGNOSIS — E1143 Type 2 diabetes mellitus with diabetic autonomic (poly)neuropathy: Secondary | ICD-10-CM | POA: Diagnosis present

## 2019-02-13 DIAGNOSIS — Z794 Long term (current) use of insulin: Secondary | ICD-10-CM | POA: Diagnosis not present

## 2019-02-13 DIAGNOSIS — M792 Neuralgia and neuritis, unspecified: Secondary | ICD-10-CM | POA: Diagnosis not present

## 2019-02-13 DIAGNOSIS — Z8249 Family history of ischemic heart disease and other diseases of the circulatory system: Secondary | ICD-10-CM

## 2019-02-13 DIAGNOSIS — Z841 Family history of disorders of kidney and ureter: Secondary | ICD-10-CM

## 2019-02-13 DIAGNOSIS — I69393 Ataxia following cerebral infarction: Secondary | ICD-10-CM | POA: Diagnosis not present

## 2019-02-13 DIAGNOSIS — Z885 Allergy status to narcotic agent status: Secondary | ICD-10-CM | POA: Diagnosis not present

## 2019-02-13 DIAGNOSIS — Z992 Dependence on renal dialysis: Secondary | ICD-10-CM | POA: Diagnosis not present

## 2019-02-13 DIAGNOSIS — R5381 Other malaise: Secondary | ICD-10-CM | POA: Diagnosis present

## 2019-02-13 DIAGNOSIS — R0989 Other specified symptoms and signs involving the circulatory and respiratory systems: Secondary | ICD-10-CM

## 2019-02-13 DIAGNOSIS — R7309 Other abnormal glucose: Secondary | ICD-10-CM

## 2019-02-13 DIAGNOSIS — Z833 Family history of diabetes mellitus: Secondary | ICD-10-CM

## 2019-02-13 DIAGNOSIS — E669 Obesity, unspecified: Secondary | ICD-10-CM | POA: Diagnosis present

## 2019-02-13 DIAGNOSIS — M549 Dorsalgia, unspecified: Secondary | ICD-10-CM | POA: Diagnosis present

## 2019-02-13 DIAGNOSIS — I152 Hypertension secondary to endocrine disorders: Secondary | ICD-10-CM | POA: Diagnosis present

## 2019-02-13 DIAGNOSIS — L299 Pruritus, unspecified: Secondary | ICD-10-CM

## 2019-02-13 DIAGNOSIS — D631 Anemia in chronic kidney disease: Secondary | ICD-10-CM | POA: Diagnosis present

## 2019-02-13 DIAGNOSIS — Z6832 Body mass index (BMI) 32.0-32.9, adult: Secondary | ICD-10-CM

## 2019-02-13 DIAGNOSIS — E11649 Type 2 diabetes mellitus with hypoglycemia without coma: Secondary | ICD-10-CM | POA: Diagnosis present

## 2019-02-13 DIAGNOSIS — R1110 Vomiting, unspecified: Secondary | ICD-10-CM

## 2019-02-13 DIAGNOSIS — K219 Gastro-esophageal reflux disease without esophagitis: Secondary | ICD-10-CM | POA: Diagnosis present

## 2019-02-13 DIAGNOSIS — E1169 Type 2 diabetes mellitus with other specified complication: Secondary | ICD-10-CM | POA: Diagnosis present

## 2019-02-13 DIAGNOSIS — Z823 Family history of stroke: Secondary | ICD-10-CM

## 2019-02-13 DIAGNOSIS — G8929 Other chronic pain: Secondary | ICD-10-CM | POA: Diagnosis present

## 2019-02-13 DIAGNOSIS — E876 Hypokalemia: Secondary | ICD-10-CM | POA: Diagnosis present

## 2019-02-13 DIAGNOSIS — D638 Anemia in other chronic diseases classified elsewhere: Secondary | ICD-10-CM

## 2019-02-13 DIAGNOSIS — N2581 Secondary hyperparathyroidism of renal origin: Secondary | ICD-10-CM | POA: Diagnosis present

## 2019-02-13 DIAGNOSIS — E1122 Type 2 diabetes mellitus with diabetic chronic kidney disease: Secondary | ICD-10-CM | POA: Diagnosis present

## 2019-02-13 DIAGNOSIS — D72829 Elevated white blood cell count, unspecified: Secondary | ICD-10-CM

## 2019-02-13 DIAGNOSIS — M898X9 Other specified disorders of bone, unspecified site: Secondary | ICD-10-CM | POA: Diagnosis present

## 2019-02-13 LAB — RENAL FUNCTION PANEL
Albumin: 2.2 g/dL — ABNORMAL LOW (ref 3.5–5.0)
Anion gap: 16 — ABNORMAL HIGH (ref 5–15)
BUN: 34 mg/dL — ABNORMAL HIGH (ref 6–20)
CO2: 23 mmol/L (ref 22–32)
Calcium: 8.3 mg/dL — ABNORMAL LOW (ref 8.9–10.3)
Chloride: 100 mmol/L (ref 98–111)
Creatinine, Ser: 7.93 mg/dL — ABNORMAL HIGH (ref 0.44–1.00)
GFR calc Af Amer: 6 mL/min — ABNORMAL LOW (ref >60)
GFR calc non Af Amer: 5 mL/min — ABNORMAL LOW (ref >60)
Glucose, Bld: 114 mg/dL — ABNORMAL HIGH (ref 70–99)
Phosphorus: 4.7 mg/dL — ABNORMAL HIGH (ref 2.5–4.6)
Potassium: 3.2 mmol/L — ABNORMAL LOW (ref 3.5–5.1)
Sodium: 139 mmol/L (ref 135–145)

## 2019-02-13 LAB — GLUCOSE, CAPILLARY
GLUCOSE-CAPILLARY: 109 mg/dL — AB (ref 70–99)
Glucose-Capillary: 231 mg/dL — ABNORMAL HIGH (ref 70–99)
Glucose-Capillary: 55 mg/dL — ABNORMAL LOW (ref 70–99)
Glucose-Capillary: 117 mg/dL — ABNORMAL HIGH (ref 70–99)
Glucose-Capillary: 302 mg/dL — ABNORMAL HIGH (ref 70–99)

## 2019-02-13 LAB — CULTURE, BODY FLUID W GRAM STAIN -BOTTLE: Culture: NO GROWTH

## 2019-02-13 LAB — CULTURE, BODY FLUID-BOTTLE

## 2019-02-13 MED ORDER — LIDOCAINE-PRILOCAINE 2.5-2.5 % EX CREA
1.0000 "application " | TOPICAL_CREAM | CUTANEOUS | Status: DC
  Filled 2019-02-13: qty 5

## 2019-02-13 MED ORDER — DARBEPOETIN ALFA 60 MCG/0.3ML IJ SOSY
60.0000 ug | PREFILLED_SYRINGE | INTRAMUSCULAR | Status: DC
  Administered 2019-02-21: 60 ug via SUBCUTANEOUS
  Filled 2019-02-13 (×2): qty 0.3

## 2019-02-13 MED ORDER — METRONIDAZOLE 0.75 % VA GEL
1.0000 | Freq: Every day | VAGINAL | Status: AC
  Administered 2019-02-13 – 2019-02-16 (×4): 1 via VAGINAL
  Filled 2019-02-13: qty 70

## 2019-02-13 MED ORDER — GABAPENTIN 300 MG PO CAPS: 300.0000 mg | ORAL_CAPSULE | Freq: Two times a day (BID) | ORAL | 0 refills | Status: DC | PRN

## 2019-02-13 MED ORDER — POLYETHYLENE GLYCOL 3350 17 G PO PACK
17.0000 g | PACK | Freq: Every day | ORAL | Status: DC
  Administered 2019-02-15 – 2019-02-18 (×3): 17 g via ORAL
  Filled 2019-02-13 (×8): qty 1

## 2019-02-13 MED ORDER — DELFLEX-LC/4.25% DEXTROSE 483 MOSM/L IP SOLN
INTRAPERITONEAL | Status: DC
  Administered 2019-02-15: 5000 mL via INTRAPERITONEAL

## 2019-02-13 MED ORDER — GABAPENTIN 100 MG PO CAPS
100.0000 mg | ORAL_CAPSULE | Freq: Two times a day (BID) | ORAL | Status: DC
  Administered 2019-02-13 – 2019-02-14 (×2): 100 mg via ORAL
  Filled 2019-02-13 (×2): qty 1

## 2019-02-13 MED ORDER — VITAMIN D (ERGOCALCIFEROL) 1.25 MG (50000 UNIT) PO CAPS: 50000.0000 [IU] | ORAL_CAPSULE | ORAL | Status: DC

## 2019-02-13 MED ORDER — HEPARIN SODIUM (PORCINE) 5000 UNIT/ML IJ SOLN
5000.0000 [IU] | Freq: Three times a day (TID) | INTRAMUSCULAR | Status: DC
  Administered 2019-02-13 – 2019-02-24 (×24): 5000 [IU] via SUBCUTANEOUS
  Filled 2019-02-13 (×29): qty 1

## 2019-02-13 MED ORDER — SENNOSIDES-DOCUSATE SODIUM 8.6-50 MG PO TABS
1.0000 | ORAL_TABLET | Freq: Two times a day (BID) | ORAL | Status: DC
  Administered 2019-02-13 – 2019-02-18 (×7): 1 via ORAL
  Filled 2019-02-13 (×15): qty 1

## 2019-02-13 MED ORDER — POTASSIUM CHLORIDE CRYS ER 20 MEQ PO TBCR
30.0000 meq | EXTENDED_RELEASE_TABLET | Freq: Two times a day (BID) | ORAL | Status: AC
  Administered 2019-02-13 – 2019-02-14 (×2): 30 meq via ORAL
  Filled 2019-02-13 (×2): qty 1

## 2019-02-13 MED ORDER — CARVEDILOL 6.25 MG PO TABS
6.2500 mg | ORAL_TABLET | Freq: Two times a day (BID) | ORAL | Status: DC
  Administered 2019-02-14 – 2019-02-18 (×10): 6.25 mg via ORAL
  Filled 2019-02-13 (×11): qty 1

## 2019-02-13 MED ORDER — ACETAMINOPHEN 325 MG PO TABS: 325.0000 mg | ORAL_TABLET | ORAL | Status: DC | PRN

## 2019-02-13 MED ORDER — LUBIPROSTONE 24 MCG PO CAPS
24.0000 ug | ORAL_CAPSULE | Freq: Two times a day (BID) | ORAL | Status: DC | PRN
  Administered 2019-02-20 – 2019-02-22 (×2): 24 ug via ORAL
  Filled 2019-02-13 (×3): qty 1

## 2019-02-13 MED ORDER — INSULIN GLARGINE 100 UNIT/ML ~~LOC~~ SOLN: 20.0000 [IU] | Freq: Every day | SUBCUTANEOUS | 11 refills | Status: DC

## 2019-02-13 MED ORDER — INSULIN GLARGINE 100 UNIT/ML ~~LOC~~ SOLN
20.0000 [IU] | Freq: Every day | SUBCUTANEOUS | Status: DC
  Administered 2019-02-14 – 2019-02-18 (×5): 20 [IU] via SUBCUTANEOUS
  Filled 2019-02-13 (×6): qty 0.2

## 2019-02-13 MED ORDER — GUAIFENESIN-DM 100-10 MG/5ML PO SYRP: 5.0000 mL | ORAL_SOLUTION | Freq: Four times a day (QID) | ORAL | Status: DC | PRN

## 2019-02-13 MED ORDER — GABAPENTIN 300 MG PO CAPS: 300.0000 mg | ORAL_CAPSULE | Freq: Two times a day (BID) | ORAL | Status: DC | PRN

## 2019-02-13 MED ORDER — SIMVASTATIN 5 MG PO TABS
5.0000 mg | ORAL_TABLET | Freq: Every day | ORAL | Status: DC
  Administered 2019-02-13 – 2019-02-21 (×7): 5 mg via ORAL
  Filled 2019-02-13 (×9): qty 1

## 2019-02-13 MED ORDER — PRO-STAT SUGAR FREE PO LIQD
30.0000 mL | Freq: Two times a day (BID) | ORAL | Status: DC
  Administered 2019-02-13 – 2019-02-16 (×2): 30 mL via ORAL
  Filled 2019-02-13 (×16): qty 30

## 2019-02-13 MED ORDER — FLEET ENEMA 7-19 GM/118ML RE ENEM: 1.0000 | ENEMA | Freq: Once | RECTAL | Status: DC | PRN

## 2019-02-13 MED ORDER — INSULIN ASPART 100 UNIT/ML ~~LOC~~ SOLN
0.0000 [IU] | Freq: Three times a day (TID) | SUBCUTANEOUS | Status: DC
  Administered 2019-02-14: 3 [IU] via SUBCUTANEOUS
  Administered 2019-02-14: 5 [IU] via SUBCUTANEOUS
  Administered 2019-02-14: 3 [IU] via SUBCUTANEOUS
  Administered 2019-02-15: 2 [IU] via SUBCUTANEOUS
  Administered 2019-02-15 (×2): 3 [IU] via SUBCUTANEOUS
  Administered 2019-02-16 (×2): 2 [IU] via SUBCUTANEOUS
  Administered 2019-02-16: 11 [IU] via SUBCUTANEOUS
  Administered 2019-02-17: 15 [IU] via SUBCUTANEOUS
  Administered 2019-02-17: 5 [IU] via SUBCUTANEOUS
  Administered 2019-02-18: 3 [IU] via SUBCUTANEOUS
  Administered 2019-02-18: 5 [IU] via SUBCUTANEOUS
  Administered 2019-02-18: 3 [IU] via SUBCUTANEOUS
  Administered 2019-02-19: 8 [IU] via SUBCUTANEOUS
  Administered 2019-02-20: 5 [IU] via SUBCUTANEOUS
  Administered 2019-02-20: 8 [IU] via SUBCUTANEOUS
  Administered 2019-02-21: 3 [IU] via SUBCUTANEOUS
  Administered 2019-02-21: 2 [IU] via SUBCUTANEOUS
  Administered 2019-02-22 (×2): 5 [IU] via SUBCUTANEOUS
  Administered 2019-02-22 – 2019-02-23 (×2): 3 [IU] via SUBCUTANEOUS
  Administered 2019-02-23: 5 [IU] via SUBCUTANEOUS
  Administered 2019-02-24: 11 [IU] via SUBCUTANEOUS

## 2019-02-13 MED ORDER — BISACODYL 10 MG RE SUPP: 10.0000 mg | Freq: Every day | RECTAL | Status: DC | PRN

## 2019-02-13 MED ORDER — PROMETHAZINE HCL 12.5 MG PO TABS
25.0000 mg | ORAL_TABLET | Freq: Two times a day (BID) | ORAL | Status: DC | PRN
  Administered 2019-02-15 – 2019-02-18 (×2): 25 mg via ORAL
  Filled 2019-02-13 (×2): qty 2

## 2019-02-13 MED ORDER — VANCOMYCIN VARIABLE DOSE PER UNSTABLE RENAL FUNCTION (PHARMACIST DOSING): Status: AC

## 2019-02-13 MED ORDER — HEPARIN 1000 UNIT/ML FOR PERITONEAL DIALYSIS
INTRAPERITONEAL | Status: DC | PRN
  Filled 2019-02-13: qty 5000

## 2019-02-13 MED ORDER — ALUMINUM HYDROXIDE GEL 320 MG/5ML PO SUSP
10.0000 mL | ORAL | Status: DC | PRN
  Filled 2019-02-13 (×2): qty 30

## 2019-02-13 MED ORDER — DIPHENHYDRAMINE HCL 50 MG/ML IJ SOLN: 12.5000 mg | Freq: Four times a day (QID) | INTRAMUSCULAR | Status: DC | PRN

## 2019-02-13 MED ORDER — VITAMIN D (ERGOCALCIFEROL) 1.25 MG (50000 UNIT) PO CAPS
50000.0000 [IU] | ORAL_CAPSULE | ORAL | Status: DC
  Administered 2019-02-13: 50000 [IU] via ORAL
  Filled 2019-02-13: qty 1

## 2019-02-13 MED ORDER — POLYETHYLENE GLYCOL 3350 17 G PO PACK: 17.0000 g | PACK | Freq: Every day | ORAL | Status: DC | PRN

## 2019-02-13 MED ORDER — TRAMADOL HCL 50 MG PO TABS: 25.0000 mg | ORAL_TABLET | Freq: Four times a day (QID) | ORAL | Status: DC | PRN

## 2019-02-13 MED ORDER — DIPHENHYDRAMINE HCL 12.5 MG/5ML PO ELIX
12.5000 mg | ORAL_SOLUTION | Freq: Four times a day (QID) | ORAL | Status: DC | PRN
  Administered 2019-02-14: 25 mg via ORAL
  Filled 2019-02-13: qty 10

## 2019-02-13 MED ORDER — VITAMIN D (ERGOCALCIFEROL) 1.25 MG (50000 UNIT) PO CAPS: 50000.0000 [IU] | ORAL_CAPSULE | ORAL | 0 refills | Status: DC

## 2019-02-13 MED ORDER — TRAZODONE HCL 50 MG PO TABS
25.0000 mg | ORAL_TABLET | Freq: Every evening | ORAL | Status: DC | PRN
  Administered 2019-02-14 – 2019-02-22 (×4): 50 mg via ORAL
  Filled 2019-02-13 (×5): qty 1

## 2019-02-13 MED ORDER — GENTAMICIN SULFATE 0.1 % EX CREA
1.0000 "application " | TOPICAL_CREAM | Freq: Every day | CUTANEOUS | Status: DC
  Administered 2019-02-14 – 2019-02-16 (×3): 1 via TOPICAL
  Filled 2019-02-13: qty 15

## 2019-02-13 MED ORDER — SODIUM CHLORIDE 0.9 % IV SOLN
1.0000 g | INTRAVENOUS | Status: AC
  Administered 2019-02-14 – 2019-02-17 (×4): 1 g via INTRAVENOUS
  Filled 2019-02-13 (×5): qty 1

## 2019-02-13 MED ORDER — METRONIDAZOLE 0.75 % VA GEL: 1.0000 | Freq: Every day | VAGINAL | 0 refills | Status: DC

## 2019-02-13 MED ORDER — ONDANSETRON HCL 4 MG/2ML IJ SOLN
4.0000 mg | Freq: Four times a day (QID) | INTRAMUSCULAR | Status: DC | PRN
  Administered 2019-02-22: 4 mg via INTRAVENOUS
  Filled 2019-02-13: qty 2

## 2019-02-13 MED ORDER — INSULIN GLARGINE 100 UNIT/ML ~~LOC~~ SOLN
20.0000 [IU] | Freq: Every day | SUBCUTANEOUS | Status: DC
  Administered 2019-02-13: 20 [IU] via SUBCUTANEOUS
  Filled 2019-02-13: qty 0.2

## 2019-02-13 NOTE — Progress Notes (Signed)
Patient to be transported to Inpatient Rehab.

## 2019-02-13 NOTE — H&P (Addendum)
Physical Medicine and Rehabilitation Admission H&P        Chief Complaint  Patient presents with  . Debility.       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  She was found to have bacterial peritonitis and started on IV antibiotics as well as IV pain meds.  Wet prep positive for bacterial vaginosis and she was started on Flagyl but this was DC'd due to ongoing issues with nausea.  Pelvic ultrasound revealed 2.8 cm left ovarian cyst--needs follow-up ultrasound in 6 to 12 weeks. On 2/11, she arrested requiring CODE BLUE and CPR to resuscitate past dose of IV Dilaudid.  She responded to Narcan and EKG without acute findings.  Abdominal pain improving and nausea and vomiting has resolved.  PD fluid now grossly clear and cultures negative so far.    Anemia of chronic disease to improve with 2 units PRBC.  She reported increase in abdominal pain with SOB on 2/17 therefore V/Q scan done and showed very low probability for PE. CXR done and was negative for pulmonary edema but patient with rales RLL and question of SOB due to fluid overload. May need CT abdomen if pain continues to be ongoing. Constipation has resolved.  Therapy ongoing and patient with significant balance deficits as well as orthostatic changes due to debility.  CIR recommended for follow-up therapy     Review of Systems  Constitutional: Negative for chills and fever.  HENT: Negative for hearing loss.   Eyes: Negative for blurred vision and double vision.  Respiratory: Negative for cough and shortness of breath.   Cardiovascular: Positive for chest pain (right chest wall pain). Negative for leg swelling.  Gastrointestinal: Positive for  abdominal pain (not as frequent now. Worse at nights with PD), nausea and vomiting.  Musculoskeletal: Positive for back pain (chronic).  Skin: Positive for itching (bilateral hands). Negative for rash.  Neurological: Positive for dizziness, sensory change (bilateral hand/feet numbness) and headaches.  Psychiatric/Behavioral: The patient has insomnia. The patient is not nervous/anxious.             Past Medical History:  Diagnosis Date  . Antiplatelet or antithrombotic long-term use: Plavix 06/30/2017  . B12 deficiency 05/10/2014  . Blood transfusion without reported diagnosis    . Chronic constipation    . CVA (cerebral vascular accident) ([place]), nonhemorrhagic, inferior right cerebellum 12/03/2017  . Cyst of left ovary 01/12/2018  . Diabetic neuropathy, painful ([place]), on low dose Gabapentin 07/13/2013  . DM (diabetes mellitus), type 2, uncontrolled, with renal complications ([place]) 01/60/1093  . DM2 (diabetes mellitus, type 2) ([place])    . Dysfunction of left eustachian tube, with pusatile tinnitus 11/24/2017  . Dyslipidemia associated with type 2 diabetes mellitus ([place]) 06/25/2016  . ESRD (end stage renal disease) on dialysis [facility])      On hemodialysis in July 2019 via [facility] then switched to [facility] in Nov 2019.   [name] ESRD with anemia ([place])    . Fibromyalgia    . Gastroparesis due to DM    . GERD (gastroesophageal reflux disease)    . Hypertension associated with diabetes ([place]) 02/19/2011  . IBS (irritable bowel syndrome)    . Left-sided weakness 12/10/2017    .  [name]  Leiomyoma of uterus    . Pancreatitis    . Proliferative diabetic retinopathy ([place]) 09/29/2015  . 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 Reactive depression 12/10/2017    .  [name] Right-sided low back pain without sciatica 12/08/2015  . Secondary hyperthyroidism 11/27/2013  . Steal syndrome dialysis vascular access ([place]) 02/17/2018  . Thromboembolism ([place]) 04/05/2018  . Upper airway cough syndrome, with recs to stay off ACE and  take Pepcid q hs 11/15/2016           Past Surgical History:  Procedure Laterality Date  . ARTERY REPAIR Right 02/17/2018    Procedure: EXPLORATION OF RIGHT BRACHIAL ARTERY;  Surgeon: Conrad [place], MD;  Location: [place];  Service: Vascular;  Laterality: Right;  . ARTERY REPAIR Right 02/17/2018    Procedure: BRACHIAL ARTERY EXPLORATION AND TRHOMBECTOMY;  Surgeon: Conrad Ladd, MD;  Location: [facility] OR;  Service: Vascular;  Laterality: Right;  . AV FISTULA PLACEMENT Left 05/09/2017    Procedure: INSERTION OF ARTERIOVENOUS (AV) GRAFT ARM (ARTEGRAFT);  Surgeon: Conrad Vayas, MD;  Location: [facility] OR;  Service: Vascular;  Laterality: Left;  . AV FISTULA PLACEMENT Right 02/17/2018    Procedure: INSERTION OF ARTERIOVENOUS (AV) GORE-TEX GRAFT ARM RIGHT UPPER ARM;  Surgeon: Conrad [place]ock, MD;  Location: [place];  Service: Vascular;  Laterality: Right;  . BASCILIC VEIN TRANSPOSITION Left 08/31/2016    Procedure: BASILIC VEIN TRANSPOSITION FIRST STAGE;  Surgeon: Conrad [place], MD;  Location: [place];  Service: Vascular;  Laterality: Left;  . CENTRAL VENOUS CATHETER INSERTION Left 07/24/2018    Procedure: INSERTION CENTRAL LINE ADULT;  Surgeon: Conrad [place], MD;  Location: [place];  Service: Vascular;  Laterality: Left;  . EYE SURGERY        secondary to diabetic retinopathy   . FOOT SURGERY Right      t"ook bone out- maybe hammer toe"  . INSERTION OF DIALYSIS CATHETER Left 07/24/2018    Procedure: INSERTION OF TUNNELED DIALYSIS CATHETER;  Surgeon: Conrad Merna, MD;  Location: [place];  Service: Vascular;  Laterality: Left;  . IR FLUORO GUIDE CV LINE RIGHT   07/21/2018  . IR US GUIDE VASC ACCESS RIGHT   07/21/2018  . LIGATION OF ARTERIOVENOUS  FISTULA Left 05/09/2017    Procedure: LIGATION OF ARTERIOVENOUS  FISTULA;  Surgeon: Conrad [place], MD;  Location: [place];  Service: Vascular;  Laterality: Left;  . LOOP RECORDER INSERTION N/A 12/05/2017    Procedure: LOOP RECORDER INSERTION;  Surgeon: Evans Lance, MD;  Location:  [place] CV LAB;  Service: Cardiovascular;  Laterality: N/A;  . MYOMECTOMY      . REMOVAL OF GRAFT Right 02/17/2018    Procedure: REMOVAL OF RIGHT UPPER ARM ARTERIOVENOUS GRAFT;  Surgeon: Conrad [place], MD;  Location: [place];  Service: Vascular;  Laterality: Right;  . REVISION OF ARTERIOVENOUS GORETEX GRAFT Left 07/24/2018    Procedure: REDO ARTERIOVENOUS GORETEX GRAFT;  Surgeon: Conrad Ohlman, MD;  Location: [place];  Service: Vascular;  Laterality: Left;  . TEE WITHOUT CARDIOVERSION N/A 12/05/2017    Procedure: TRANSESOPHAGEAL ECHOCARDIOGRAM (TEE);  Surgeon: Sanda Klein, MD;  Location: [facility] ENDOSCOPY;  Service: Cardiovascular;  Laterality: N/A;  . UPPER EXTREMITY VENOGRAPHY Left 07/13/2018    Procedure: UPPER EXTREMITY VENOGRAPHY - Central & Left Arm;  Surgeon: Conrad [place], MD;  Location: [place] CV LAB;  Service: Cardiovascular;  Laterality: Left;  . UTERINE FIBROID SURGERY      .  VITRECTOMY Bilateral             Family History  Problem Relation Age of Onset  . Colon polyps Mother    . Diabetes Mother    . Heart murmur Father          history essentially unknown  . Hypertension Father    . Diabetes Sister    . Kidney failure Sister          kidney transplant  . Diabetes Brother    . Retinal degeneration Brother    . Heart murmur Son    . Stroke Paternal Grandmother    . Diabetes Sister        Social History:  Married. Independent and works for [employer]. Uses cane out of home and furniture walks in the home. She reports that she has never smoked. She has never used smokeless tobacco. She reports that she does not drink alcohol or use drugs.           Allergies  Allergen Reactions  . Ibuprofen Other (See Comments)      CKD stage 3. Should avoid.  . Dilaudid [Hydromorphone Hcl] Itching and Other (See Comments)      Can take with Benadryl.  . Tramadol Itching          Medications Prior to Admission  Medication Sig Dispense Refill  . baclofen (LIORESAL) 10 MG  tablet TAKE 1/2 TABLET BY MOUTH 2 (TWO) TIMES DAILY AS NEEDED FOR MUSCLE SPASMS. 90 tablet 1  . carvedilol (COREG) 6.25 MG tablet Take 1 tablet (6.25 mg total) by mouth 2 (two) times daily with a meal. 60 tablet 6  . insulin aspart (NOVOLOG) 100 UNIT/ML injection Inject 6-8 Units into the skin 3 (three) times daily before meals. 8 UNITS WITH BREAKFAST, 6 UNITS WITH LUNCH, & 8 UNITS WITH SUPPER      . insulin degludec (TRESIBA FLEXTOUCH) 100 UNIT/ML SOPN FlexTouch Pen Inject 0.2 mLs (20 Units total) into the skin daily with lunch. (Patient taking differently: Inject 28 Units into the skin daily with lunch. ) 1 pen 3  . lidocaine-prilocaine (EMLA) cream Apply 1 application topically as directed. For Dialysis use   11  . lubiprostone (AMITIZA) 24 MCG capsule Take 1 capsule (24 mcg total) by mouth 2 (two) times daily with a meal. (Patient taking differently: Take 24 mcg by mouth 2 (two) times daily with a meal. As needed) 60 capsule 5  . oxyCODONE (ROXICODONE) 5 MG immediate release tablet Take 0.5 tablets (2.5 mg total) by mouth every 6 (six) hours as needed for severe pain. 20 tablet 0  . promethazine (PHENERGAN) 25 MG tablet Take 25 mg by mouth every 12 (twelve) hours as needed for nausea.      . simvastatin (ZOCOR) 10 MG tablet Take 0.5 tablets (5 mg total) by mouth at bedtime. 1/2 po q hs - GFR 14 45 tablet 1  . [EXPIRED] metroNIDAZOLE (FLAGYL) 500 MG tablet Take 1 tablet (500 mg total) by mouth 2 (two) times daily for 7 days. 14 tablet 0      Drug Regimen Review  Drug regimen was reviewed and remains appropriate with no significant issues identified   Home: Home Living Family/patient expects to be discharged to:: Private residence Living Arrangements: Spouse/significant other Available Help at Discharge: Family, Available PRN/intermittently Type of Home: House Home Access: Stairs to enter [employer] of Steps: 2 Entrance Stairs-Rails: None Home Layout: One level Bathroom  Shower/Tub: Chiropodist: Standard Home  Equipment: Gilford Rile - 2 wheels, [employer] - single point   Functional History: Prior Function Level of Independence: Independent Comments: still working   Functional Status:  Mobility: Bed Mobility Overal bed mobility: Needs Assistance Bed Mobility: Supine to Sit Supine to sit: Mod assist, +2 for safety/equipment, HOB elevated General bed mobility comments: assist for trunk elevation, +2 for safety - use of bed rails and elevated HOB Transfers Overall transfer level: Needs assistance Equipment used: Rolling walker (2 wheeled) Transfers: Sit to/from Stand Sit to Stand: Mod assist, +2 physical assistance General transfer comment: Patient required modA +2 for lift assist to stand. Patient with dizziness in standing that subsided.  Ambulation/Gait Ambulation/Gait assistance: Mod assist, +2 physical assistance Gait Distance (Feet): 2 Feet Assistive device: 2 person hand held assist Gait Pattern/deviations: Step-to pattern, Decreased step length - right, Decreased step length - left, Decreased stride length General Gait Details: Patient required modA +2 for steadying with 2 person HHA to ambulate short distance to chair. Patient with significant unsteadiness requiring external support to maintain balance Gait velocity: decreased Gait velocity interpretation: <1.8 ft/sec, indicate of risk for recurrent falls   ADL: ADL Overall ADL's : Needs assistance/impaired Eating/Feeding: Modified independent, Sitting Grooming: Wash/dry hands, Wash/dry face, Oral care, Set up, Sitting Grooming Details (indicate cue type and reason): EOB, unable to maintain standing at this time Upper Body Bathing: Minimal assistance Lower Body Bathing: Minimal assistance, Sitting/lateral leans Upper Body Dressing : Minimal assistance Lower Body Dressing: Minimal assistance Lower Body Dressing Details (indicate cue type and reason): increased time, able to  don/doff socks EOB - requires assist for items that are typically sit<>stand Toilet Transfer: Moderate assistance, +2 for physical assistance, +2 for safety/equipment, Stand-pivot, BSC, RW Toileting- Clothing Manipulation and Hygiene: Maximal assistance, Sit to/from stand Toileting - Clothing Manipulation Details (indicate cue type and reason): Pt requires BUE for balance, peri care requires assist from external person Functional mobility during ADLs: Moderate assistance, +2 for safety/equipment, Rolling walker General ADL Comments: decreased activity tolerance,    Cognition: Cognition Overall Cognitive Status: Within Functional Limits for tasks assessed Orientation Level: Oriented X4 Cognition Arousal/Alertness: Awake/alert Behavior During Therapy: WFL for tasks assessed/performed Overall Cognitive Status: Within Functional Limits for tasks assessed     Blood pressure (!) 161/81, pulse 76, temperature (!) 97.5 F (36.4 C), temperature source Oral, resp. rate 18, height 5\' 9"  (1.753 m), weight 103.7 kg, last menstrual period 12/13/2018, SpO2 100 %. Physical Exam  Nursing note and vitals reviewed. Constitutional: She appears well-developed and well-nourished. No distress.  Fatigued appearing.   Neck: Normal range of motion.  Cardiovascular: Normal rate and regular rhythm. Exam reveals no friction rub.  No murmur heard. Respiratory: Effort normal and breath sounds normal. No respiratory distress. She has no wheezes.  GI: She exhibits no distension. There is abdominal tenderness.  Dry dressing on LLQ PD catheter   Musculoskeletal: Normal range of motion.  Neurological: [name] alert. No cranial nerve deficit. She exhibits normal muscle tone. Coordination normal.  Moves all 4's. UE 3/5 prox to 4/5 distally. LE 3/5 prox to distal. Decreased FMC RUE and RLE.  Decreased LT distally in LE's  Skin: [name] not diaphoretic.  Psychiatric: She has a normal mood and affect. Her behavior is normal.  Judgment and thought content normal.      Lab Results Last 48 Hours        Results for orders placed or performed during the hospital encounter of 02/05/19 (from the past 48 hour(s))  Glucose,  capillary     Status: Abnormal    Collection Time: 02/11/19  4:47 PM  Result Value Ref Range    Glucose-Capillary 116 (H) 70 - 99 mg/dL  Vitamin B12     Status: None    Collection Time: 02/11/19  6:12 PM  Result Value Ref Range    Vitamin B-12 573 180 - 914 pg/mL      Comment: (NOTE) This assay is not validated for testing neonatal or myeloproliferative syndrome specimens for Vitamin B12 levels. Performed at [facility], [place] [address], [place], [place] [address]    VITAMIN D 25 Hydroxy (Vit-D Deficiency, Fractures)     Status: Abnormal    Collection Time: 02/11/19  6:12 PM  Result Value Ref Range    Vit D, 25-Hydroxy <4.0 (L) 30.0 - 100.0 ng/mL      Comment: (NOTE) Vitamin D deficiency has been defined by the [place] practice guideline as a level of serum 25-OH vitamin D less than 20 ng/mL (1,2). The Endocrine Society went on to further define vitamin D insufficiency as a level between 21 and 29 ng/mL (2). 1. IOM (Institute of Medicine). 2010. Dietary reference   intakes for calcium and D. [place]: The   [employer]. 2. Holick MF, Binkley [place], Bischoff-Ferrari HA, et al.   Evaluation, treatment, and prevention of vitamin D   deficiency: an Endocrine Society clinical practice   guideline. JCEM. 2011 Jul; 96(7):1911-30. Performed At: [facility] [place], [place] [id number] Rush Farmer MD QM:[id number]    Glucose, capillary     Status: Abnormal    Collection Time: 02/11/19 10:32 PM  Result Value Ref Range    Glucose-Capillary 173 (H) 70 - 99 mg/dL  Vancomycin, random     Status: None    Collection Time: 02/12/19  6:21 AM  Result Value Ref Range    Vancomycin Rm 18        Comment:          Random Vancomycin therapeutic range is dependent on dosage and time of specimen collection. A peak range is 20.0-40.0 ug/mL A trough range is 5.0-15.0 ug/mL        Performed at [facility] [address], [place], [place] [address]    CBC     Status: Abnormal    Collection Time: 02/12/19  6:21 AM  Result Value Ref Range    WBC 9.3 4.0 - 10.5 K/uL    RBC 3.65 (L) 3.87 - 5.11 MIL/uL    Hemoglobin 10.4 (L) 12.0 - 15.0 g/dL    HCT 34.6 (L) 36.0 - 46.0 %    MCV 94.8 80.0 - 100.0 fL    MCH 28.5 26.0 - 34.0 pg    MCHC 30.1 30.0 - 36.0 g/dL    RDW 16.4 (H) 11.5 - 15.5 %    Platelets 326 150 - 400 K/uL    nRBC 1.3 (H) 0.0 - 0.2 %      Comment: Performed at [facility] [address] [place], [place] [address]  Glucose, capillary     Status: Abnormal    Collection Time: 02/12/19  7:21 AM  Result Value Ref Range    Glucose-Capillary 257 (H) 70 - 99 mg/dL  Glucose, capillary     Status: Abnormal    Collection Time: 02/12/19 11:13 AM  Result Value Ref Range    Glucose-Capillary 221 (H) 70 - 99 mg/dL  Glucose,  capillary     Status: Abnormal    Collection Time: 02/12/19  2:38 PM  Result Value Ref Range    Glucose-Capillary 105 (H) 70 - 99 mg/dL  Glucose, capillary     Status: Abnormal    Collection Time: 02/12/19  5:09 PM  Result Value Ref Range    Glucose-Capillary 145 (H) 70 - 99 mg/dL  Glucose, capillary     Status: Abnormal    Collection Time: 02/12/19 10:22 PM  Result Value Ref Range    Glucose-Capillary 177 (H) 70 - 99 mg/dL  Glucose, capillary     Status: Abnormal    Collection Time: 02/13/19  7:16 AM  Result Value Ref Range    Glucose-Capillary 231 (H) 70 - 99 mg/dL  Renal function panel     Status: Abnormal    Collection Time: 02/13/19 10:38 AM  Result Value Ref Range    Sodium 139 135 - 145 mmol/L    Potassium 3.2 (L) 3.5 - 5.1 mmol/L    Chloride 100 98 - 111 mmol/L    CO2 23 22 - 32 mmol/L    Glucose, Bld 114 (H) 70 - 99 mg/dL    BUN 34 (H) 6 - 20  mg/dL    Creatinine, Ser 7.93 (H) 0.44 - 1.00 mg/dL    Calcium 8.3 (L) 8.9 - 10.3 mg/dL    Phosphorus 4.7 (H) 2.5 - 4.6 mg/dL    Albumin 2.2 (L) 3.5 - 5.0 g/dL    GFR calc non Af Amer 5 (L) >60 mL/min    GFR calc Af Amer 6 (L) >60 mL/min    Anion gap 16 (H) 5 - 15      Comment: Performed at [facility], [address], [place], [place] [address]  Glucose, capillary     Status: Abnormal    Collection Time: 02/13/19 11:22 AM  Result Value Ref Range    Glucose-Capillary 55 (L) 70 - 99 mg/dL  Glucose, capillary     Status: Abnormal    Collection Time: 02/13/19 12:38 PM  Result Value Ref Range    Glucose-Capillary 117 (H) 70 - 99 mg/dL       Imaging Results (Last 48 hours)  Dg Chest 2 View   Result Date: 02/12/2019 CLINICAL DATA:  Shortness of breath and right chest pain. Nausea and vomiting 3 days ago. EXAM: CHEST - 2 VIEW COMPARISON:  07/24/2017 and thoracic spine 12/18/2018 FINDINGS: Lungs are adequately inflated and otherwise clear. Cardiomediastinal silhouette is within normal. Stable mild compression fracture over the midthoracic spine. IMPRESSION: No active cardiopulmonary disease. Electronically Signed   By: Marin Olp M.D.   On: 02/12/2019 11:13    Nm Pulmonary Perf And Vent   Result Date: 02/12/2019 CLINICAL DATA:  Chest pain and shortness of breath EXAM: NUCLEAR MEDICINE VENTILATION - PERFUSION LUNG SCAN VIEWS: Anterior, posterior, left lateral, right lateral, RAO, LAO, RPO, LPO-ventilation and perfusion RADIOPHARMACEUTICALS:  32.4 mCi of Tc-68m DTPA aerosol inhalation and 4.3 mCi Tc5m MAA IV COMPARISON:  Chest radiograph February 12, 2019 FINDINGS: Ventilation: Radiotracer uptake is homogeneous and symmetric bilaterally. No ventilation defects are evident. Perfusion: Radiotracer uptake is homogeneous and symmetric bilaterally. No perfusion defects are evident. IMPRESSION: No appreciable ventilation or perfusion defects. Very low probability of pulmonary embolus.  Electronically Signed   By: Lowella Grip III M.D.   On: 02/12/2019 14:26           Medical Problem List and Plan: 1.  Functional decline secondary to debility  after PD relarted peritonitis/sepsis, pt with history of right cerebellar infarct with residual right limb/truncal ataxia and balance deficits             -admit to inpatient rehab 2.  DVT Prophylaxis/Anticoagulation: Pharmaceutical: Heparin 3. Chronic back pain/Pain Management: No baclofen or muscle relaxers per Nephrology. Continue Tramadol as needed 4. Mood: LCSW to follow for evaluation and support 5. Neuropsych: This patient is capable of making decisions on her own behalf. 6. Skin/Wound Care: Routine pressure relief measures. 7. Fluids/Electrolytes/Nutrition: Monitor I's and O's.  Check weights daily 8. ESRD: Continue PD in the evenings.  Nephrology to follow for PD assistance/management and correction of electrolyte abnormalities. [name]  9.  PD associated peritonitis: Vancomycin/Fortaz initiated 2/[facility] ED--to continue for 14 total days.  .   10.  T2DM:  Decreased 2/18 due to hypoglycemia. Continue Lantus (on Tresiba at home) at lunch with meal coverage as well as sliding scale insulin for tighter blood sugar control.  Monitor blood sugars AC at bedtime.  11.  Chronic constipation: Encourage compliance with bowel program.  12. Anemia of chronic disease: On Arnaesp weekly.  Continue to monitor with serial checks. Monitor stool guaiacs.  13. Neuropathy: Resume lower dose gabapentin 100 mg bid.  14. Bacterial vaginosis: On flagyl D#2/5 15. HTN: Monitor BP bid. On coreg bid.  16. Persistent Hypokalemia: Will add two doses Kdur.        Bary Leriche, PA-C 02/13/2019  Post Admission Physician Evaluation: 1. Functional deficits secondary  to debility. 2. Patient is admitted to receive collaborative, interdisciplinary care between the physiatrist, rehab nursing staff, and therapy team. 3. Patient's level of medical complexity  and substantial therapy needs in context of that medical necessity cannot be provided at a lesser intensity of care such as a SNF. 4. Patient has experienced substantial functional loss from his/her baseline which was documented above under the "Functional History" and "Functional Status" headings.  Judging by the patient's diagnosis, physical exam, and functional history, the patient has potential for functional progress which will result in measurable gains while on inpatient rehab.  These gains will be of substantial and practical use upon discharge  in facilitating mobility and self-care at the household level. 5. Physiatrist will provide 24 hour management of medical needs as well as oversight of the therapy plan/treatment and provide guidance as appropriate regarding the interaction of the two. 6. The Preadmission Screening has been reviewed and patient status is unchanged unless otherwise stated above. 7. 24 hour rehab nursing will assist with bladder management, bowel management, safety, skin/wound care, disease management, medication administration, pain management and patient education  and help integrate therapy concepts, techniques,education, etc. 8. PT will assess and treat for/with: Lower extremity strength, range of motion, stamina, balance, functional mobility, safety, adaptive techniques and equipment, pain mgt, community reentry.   Goals are: mod I. 9. OT will assess and treat for/with: ADL's, functional mobility, safety, upper extremity strength, adaptive techniques and equipment, ego support, community reentry.   Goals are: mod I. Therapy may proceed with showering this patient. 10. SLP will assess and treat for/with: n/a.  Goals are: n/a. 11. Case Management and Social Worker will assess and treat for psychological issues and discharge planning. 12. Team conference will be held weekly to assess progress toward goals and to determine barriers to discharge. 13. Patient will receive at  least 3 hours of therapy per day at least 5 days per week. 14. ELOS: 7-10 days  15. Prognosis:  excellent   I have personally performed a face to face diagnostic evaluation of this patient and formulated the key components of the plan.  Additionally, I have personally reviewed laboratory data, imaging studies, as well as relevant notes and concur with the physician assistant's documentation above.  Meredith Staggers, MD, [place]rown

## 2019-02-13 NOTE — Progress Notes (Signed)
CRITICAL VALUE ALERT  Critical Value: CBG:55  Date & Time Notied:  02/13/19 1145  Provider Notified: Tyrell Antonio, MD  Orders Received/Actions taken: No hypoglycemic symptoms observed or stated by patient. Carb snack given. Orders changed by MD. CBG re-checked after patient ate lunch. Now 117. Will continue to monitor.

## 2019-02-13 NOTE — IPOC Note (Signed)
Overall Plan of Care [facility]) Patient Details Name: Heather [name] MRN: [id number] DOB: 1971/02/12  Admitting Diagnosis: [facility] Problems: Active Problems:   Debility   Pruritus   Neuropathic pain   Renovascular hypertension   Anemia of chronic disease   Diabetes mellitus type 2 in obese (HCC)   Labile blood glucose   ESRD on dialysis ([place])   Bacterial peritonitis ([place])     Functional Problem List: Nursing Endurance, Medication Management, Motor, Safety, Sensory, Skin Integrity, Pain, Edema  PT Balance, Endurance, Motor, Pain, Safety  OT Balance, Endurance, Safety, Sensory, Motor  SLP    TR         Basic ADL's: OT Grooming, Bathing, Dressing, Toileting     Advanced  ADL's: OT       Transfers: PT Bed Mobility, Bed to Chair, Car, [employer], Floor  OT Toilet     Locomotion: PT Ambulation, Emergency planning/management officer, Stairs     Additional Impairments: OT Fuctional Use of Upper Extremity  SLP        TR      Anticipated Outcomes Item Anticipated Outcome  Self Feeding Indep  Swallowing      Basic self-care  Mod I  Toileting  Mod I   Bathroom Transfers Mod I  Bowel/Bladder  Pt will manage bowel and bladder with min assist while in rehab.   Transfers  Mod I  Locomotion  Mod I with LRAD  Communication     Cognition     Pain  Pt will manage pain at 3 or less on a scale of 0-10.   Safety/Judgment  Pt will remain free of falls and injury with min assist while in rehab.    Therapy Plan: PT Intensity: Minimum of 1-2 x/day ,45 to 90 minutes PT Frequency: 5 out of 7 days PT Duration Estimated Length of Stay: 7-10 days OT Intensity: Minimum of 1-2 x/day, 45 to 90 minutes OT Frequency: 5 out of 7 days OT Duration/Estimated Length of Stay: 7-10 days      Team Interventions: Nursing Interventions Patient/Family Education, Disease Management/Prevention, Medication Management, Pain Management, Skin Care/Wound Management, Discharge Planning  PT  interventions Ambulation/gait training, Training and development officer, Community reintegration, Discharge planning, Disease management/prevention, DME/adaptive equipment instruction, Functional mobility training, Neuromuscular re-education, Pain management, Patient/family education, Psychosocial support, Stair training, Therapeutic Activities, Therapeutic Exercise, UE/LE Strength taining/ROM, UE/LE Coordination activities, Splinting/orthotics  OT Interventions Training and development officer, Academic librarian, Discharge planning, Engineer, drilling, Patient/family education, Self Care/advanced ADL retraining, Neuromuscular re-education, Pain management, Psychosocial support, Therapeutic Activities, UE/LE Strength taining/ROM, Therapeutic Exercise, UE/LE Coordination activities  SLP Interventions    TR Interventions    SW/CM Interventions Discharge Planning, Psychosocial Support, Patient/Family Education   Barriers to Discharge MD  Medical stability, IV antibiotics and PD  Nursing      PT Decreased caregiver support, Medical stability, Hemodialysis    OT      SLP      SW       Team Discharge Planning: Destination: PT-Home ,OT- Home , SLP-  Projected Follow-up: PT-Home health PT, OT-  Home health OT, SLP-  Projected Equipment Needs: PT-Rolling walker with 5" wheels, OT- To be determined, SLP-  Equipment Details: PT-TBD pending progress, OT-Pt has BSC Patient/family involved in discharge planning: PT- Patient,  OT-Patient, SLP-   MD ELOS: 7-10 days. 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 She was found to have bacterial peritonitis and started on IV antibiotics as  well as IV pain meds. Wet prep positive for bacterial vaginosis and she was started on Flagyl but this was DC'd due to ongoing issues with nausea. Pelvic ultrasound revealed 2.8 cm left ovarian cyst--needs follow-up ultrasound in 6 to 12 weeks. On 2/11,she arrested requiring CODE BLUE and CPR to resuscitate. She responded to Narcan and EKG without acute findings. Abdominal pain improving and nausea and vomiting has resolved. PD fluid now grossly clear and cultures negative so far. Anemia of chronic disease to improve with 2 units PRBC.She reported increase in abdominal pain with SOB on 2/17 therefore V/Q scan done and showed very low probability for PE. CXR done and was negative for pulmonary edema but patient with rales RLL and question of SOB due to fluid overload.May needCT abdomen if pain continues to be ongoing.Constipation has resolved.Therapy ongoing and patient with significant balance deficits as well as orthostatic changes due to debility. Will set goals for Mod I with PT/OT.  See Team Conference Notes for weekly updates to the plan of care

## 2019-02-13 NOTE — Progress Notes (Signed)
PMR Admission Coordinator Pre-Admission Assessment  Patient: Heather [name] is an 48 y.o., female MRN: [id number] DOB: Aug 24, 1971 Height:   Weight: 111.7 kg                                                                                                                                                  Insurance Information HMO:     PPO:      PCP:      IPA:      80/20:      OTHER: YES PRIMARY: [employer]      Policy#: [id number]      Subscriber: Patient CM Name: Pricilla Holm      Phone#: [phone] [id number]     Fax#: [phone] Pre-Cert#: [id number]      Employer:  Josem Kaufmann provided by Maren Reamer on 2/18 for admit to CIR. Auth 2/18 with last covered date 2/24; clinical updates due 2/25 to [employer] (p): [phone]; (f): [phone] Benefits:  Phone #: NA     Name: Online Portal Eff. Date: 12/27/18     Deduct: $2,600 (met $690.05)      Out of Pocket Max: $4,000 ($735.69); includes deductible      Life Max: NA CIR: 80%/20%      SNF: 80%/20%; 100 day limit Outpatient: 80%, per necessity     Co-Pay: 20% Home Health: 80%, per necessity      Co-Pay: 20% DME: 100%     Co-Pay:  Providers:  SECONDARY:       Policy#:       Subscriber:  CM Name:       Phone#:      Fax#:  Pre-Cert#:       Employer:  Benefits:  Phone #:      Name:  Eff. Date:      Deduct:       Out of Pocket Max:       Life Max:  CIR:       SNF:  Outpatient:      Co-Pay:  Home Health:       Co-Pay:  DME:      Co-Pay:  Medicaid Application Date:       Case Manager:  Disability Application Date:       Case Worker:   Emergency Contact Information         Contact Information    Name Relation Home Work Mobile   Cordaro,Charles Spouse [phone]       Current Medical History  Patient Admitting Diagnosis: 48 yo female with baseline gait disorder after previous right cerebellar infarct who is now having further problems with mobility and self-care after being hospitalized for bacterial peritonitis  History of  Present Illness: Heather [name] is a 48 year old female withhistory of T2DM that retinopathy, neuropathy and gastroparesis, B12 deficiency,chronic back pain/fibromyalgia,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 Pt was found to have bacterial peritonitis and started on IV antibiotics as well as IV pain meds. Pt also found to have bacterial vaginosis and she was started on Flagyl but this was DC'd due to ongoing issues with nausea. Pelvic ultrasound revealed 2.8 cm left ovarian cyst--with needs for follow-up ultrasound in 6 to 12 weeks. On 2/11,she arrested requiring CODE BLUE and CPR to resuscitate past dose of IV Dilaudid. She responded to Narcan and EKG without acute findings. Abdominal pain improving and nausea and vomiting has resolved. PD fluid now grossly clear and cultures negative so far. Anemia of chronic disease to improve with 2 units PRBC.   She reported increase in abdominal pain with SOB on 2/17 therefore V/Q scan done and showed very low probability for PE. CXR done and was negative for pulmonary edema but patient with rales RLL and question of SOB due to fluid overload. Question CT abdomen if pain continues to be ongoing.Per Dr. Tyrell Antonio, no CT needed at this time and pt ready for DC to CIR.   Therapy ongoing and patient with significant balance deficits as well as orthostatic changes due to debility. CIR recommended for follow-up therapy. Pt is to be admitted to CIR on 02/13/19.    Past Medical History      Past Medical History:  Diagnosis Date  . Antiplatelet or antithrombotic long-term use: Plavix 06/30/2017  . B12 deficiency 05/10/2014  . Blood transfusion without reported diagnosis   . Chronic constipation   . CVA (cerebral vascular accident) ([place]), nonhemorrhagic, inferior right cerebellum 12/03/2017  . Cyst of left ovary  01/12/2018  . Diabetic neuropathy, painful ([place]), on low dose Gabapentin 07/13/2013  . DM (diabetes mellitus), type 2, uncontrolled, with renal complications ([place]) 95/18/8416  . DM2 (diabetes mellitus, type 2) ([place])   . Dysfunction of left eustachian tube, with pusatile tinnitus 11/24/2017  . Dyslipidemia associated with type 2 diabetes mellitus ([place]) 06/25/2016  . ESRD (end stage renal disease) on dialysis [facility])    On hemodialysis in July 2019 via [facility] then switched to [facility] in Nov 2019.   [name] ESRD with anemia ([place])   . Fibromyalgia   . Gastroparesis due to DM   . GERD (gastroesophageal reflux disease)   . Hypertension associated with diabetes ([place]) 02/19/2011  . IBS (irritable bowel syndrome)   . Left-sided weakness 12/10/2017   .  [name] Leiomyoma of uterus   . Pancreatitis   . Proliferative diabetic retinopathy ([place]) 09/29/2015  . 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 Reactive depression 12/10/2017   .  [name] Right-sided low back pain without sciatica 12/08/2015  . Secondary hyperthyroidism 11/27/2013  . 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 Thromboembolism ([place]) 04/05/2018  . Upper airway cough syndrome, with recs to stay off ACE and take Pepcid q hs 11/15/2016    Family History  family history includes Colon polyps in her mother; Diabetes in her brother, mother, sister, and sister; Heart murmur in her father and son; Hypertension in her father; Kidney failure in her sister; Retinal degeneration in her brother; Stroke in her paternal grandmother.  Prior Rehab/Hospitalizations:  Has the patient had major surgery during 100 days prior to admission? No  Current Medications   Current Facility-Administered Medications:  .  carvedilol (COREG) tablet 6.25 mg, 6.25 mg, Oral, BID WC, Evangeline Gula, Wyatt Haste, MD, 6.25 mg at  02/13/19 0751 .  cefTAZidime (FORTAZ) 1 g in sodium chloride 0.9 % 100 mL IVPB, 1 g, Intravenous, Q24H, Pierce, Dwayne A, RPH, Last Rate:  200 mL/hr at 02/12/19 1501, 1 g at 02/12/19 1501 .  Darbepoetin Alfa (ARANESP) injection 60 mcg, 60 mcg, Subcutaneous, Q Wed-1800, Ernest Haber, PA-C, 60 mcg at 02/07/19 1820 .  dialysis solution 4.25% low-MG/low-CA dianeal solution, , Intraperitoneal, Q24H, Schertz, Robert, MD .  diphenhydrAMINE (BENADRYL) injection 12.5 mg, 12.5 mg, Intravenous, Q6H PRN, Lady Deutscher, MD, 12.5 mg at 02/12/19 1737 .  feeding supplement (PRO-STAT SUGAR FREE 64) liquid 30 mL, 30 mL, Oral, BID, Lynnda Child, PA-C, 30 mL at 02/11/19 4098 .  gabapentin (NEURONTIN) capsule 300 mg, 300 mg, Oral, BID PRN, Lady Deutscher, MD .  gentamicin cream (GARAMYCIN) 0.1 % 1 application, 1 application, Topical, Daily, Roney Jaffe, MD, 1 application at 11/91/47 1842 .  heparin 2,500 Units in dialysis solution 4.25% low-MG/low-CA 5,000 mL dialysis solution, , Peritoneal Dialysis, PRN, Hammons, Theone Murdoch, RPH .  heparin injection 5,000 Units, 5,000 Units, Subcutaneous, Q8H, Lady Deutscher, MD, 5,000 Units at 02/13/19 0525 .  insulin aspart (novoLOG) injection 0-15 Units, 0-15 Units, Subcutaneous, TID WC, Lady Deutscher, MD, 5 Units at 02/13/19 [phone] .  insulin aspart (novoLOG) injection 6 Units, 6 Units, Subcutaneous, Q lunch, Lady Deutscher, MD, 6 Units at 02/11/19 1334 .  insulin aspart (novoLOG) injection 8 Units, 8 Units, Subcutaneous, Q breakfast, Lady Deutscher, MD, 8 Units at 02/13/19 0750 .  insulin aspart (novoLOG) injection 8 Units, 8 Units, Subcutaneous, Q supper, Lady Deutscher, MD, 8 Units at 02/11/19 1755 .  insulin glargine (LANTUS) injection 28 Units, 28 Units, Subcutaneous, Q lunch, Lady Deutscher, MD, 28 Units at 02/12/19 1450 .  lidocaine-prilocaine (EMLA) cream 1 application, 1 application, Topical, UD, Sheehan, Wyatt Haste, MD .  lubiprostone (AMITIZA) capsule 24 mcg, 24 mcg, Oral, BID PRN, Lady Deutscher, MD .  metroNIDAZOLE (METROGEL) 0.75 % vaginal gel 1  Applicatorful, 1 Applicatorful, Vaginal, QHS, Regalado, Belkys A, MD, 1 Applicatorful at 62/13/08 2307 .  ondansetron (ZOFRAN) injection 4 mg, 4 mg, Intravenous, Q6H PRN, Regalado, Belkys A, MD, 4 mg at 02/08/19 1248 .  polyethylene glycol (MIRALAX / GLYCOLAX) packet 17 g, 17 g, Oral, Daily, Florencia Reasons, MD, 17 g at 02/12/19 1507 .  promethazine (PHENERGAN) tablet 25 mg, 25 mg, Oral, Q12H PRN, Lady Deutscher, MD, 25 mg at 02/05/19 2119 .  senna-docusate (Senokot-S) tablet 1 tablet, 1 tablet, Oral, BID, Florencia Reasons, MD, 1 tablet at 02/12/19 1506 .  simvastatin (ZOCOR) tablet 5 mg, 5 mg, Oral, QHS, Lady Deutscher, MD, 5 mg at 02/12/19 2307 .  sodium chloride flush (NS) 0.9 % injection 3 mL, 3 mL, Intravenous, Q12H, Lady Deutscher, MD, 3 mL at 02/12/19 2305 .  traMADol (ULTRAM) tablet 25 mg, 25 mg, Oral, Q6H PRN, Regalado, Belkys A, MD, 25 mg at 02/12/19 2306 .  vancomycin variable dose per unstable renal function (pharmacist dosing), , Does not apply, See admin instructions, Theotis Burrow, RPH .  Vitamin D (Ergocalciferol) (DRISDOL) capsule 50,000 Units, 50,000 Units, Oral, Q7 days, Regalado, Belkys A, MD, 50,000 Units at 02/13/19 0751  Patients Current Diet:     Diet Order                  Diet Carb Modified Fluid consistency: Thin; Room service appropriate? Yes; Fluid restriction: 1200 mL Fluid  Diet effective now  Precautions / Restrictions Precautions Precautions: Fall Restrictions Weight Bearing Restrictions: No   Has the patient had 2 or more falls or a fall with injury in the past year?Yes  Prior Activity Level Community (5-7x/wk): worked full time for [employer], drove PTA; active; did her own Peritoneal Dialsysis in evenings  [place] / Equipment Home Equipment: Environmental consultant - 2 wheels, [place] - single point  Prior Device Use: Indicate devices/aids used by the patient prior to current illness, exacerbation or injury? Rollator;  infrequently used SPC  Prior Functional Level Prior Function Level of Independence: Independent Comments: still working  Self Care: Did the patient need help bathing, dressing, using the toilet or eating?  Independent  Indoor Mobility: Did the patient need assistance with walking from room to room (with or without device)? Independent  Stairs: Did the patient need assistance with internal or external stairs (with or without device)? Needed some help  Functional Cognition: Did the patient need help planning regular tasks such as shopping or remembering to take medications? Independent  Current Functional Level Cognition  Overall Cognitive Status: Within Functional Limits for tasks assessed Orientation Level: Oriented X4    Extremity Assessment (includes Sensation/Coordination)  Upper Extremity Assessment: Generalized weakness  Lower Extremity Assessment: Defer to PT evaluation    ADLs  Overall ADL's : Needs assistance/impaired Eating/Feeding: Modified independent, Sitting Grooming: Wash/dry hands, Wash/dry face, Oral care, Set up, Sitting Grooming Details (indicate cue type and reason): EOB, unable to maintain standing at this time Upper Body Bathing: Minimal assistance Lower Body Bathing: Minimal assistance, Sitting/lateral leans Upper Body Dressing : Minimal assistance Lower Body Dressing: Minimal assistance Lower Body Dressing Details (indicate cue type and reason): increased time, able to don/doff socks EOB - requires assist for items that are typically sit<>stand Toilet Transfer: Moderate assistance, +2 for physical assistance, +2 for safety/equipment, Stand-pivot, BSC, RW Toileting- Clothing Manipulation and Hygiene: Maximal assistance, Sit to/from stand Toileting - Clothing Manipulation Details (indicate cue type and reason): Pt requires BUE for balance, peri care requires assist from external person Functional mobility during ADLs: Moderate assistance, +2 for  safety/equipment, Rolling walker General ADL Comments: decreased activity tolerance,     Mobility  Overal bed mobility: Needs Assistance Bed Mobility: Supine to Sit Supine to sit: Mod assist, +2 for safety/equipment, HOB elevated General bed mobility comments: assist for trunk elevation, +2 for safety - use of bed rails and elevated HOB    Transfers  Overall transfer level: Needs assistance Equipment used: Rolling walker (2 wheeled) Transfers: Sit to/from Stand Sit to Stand: Mod assist, +2 physical assistance General transfer comment: Patient required modA +2 for lift assist to stand. Patient with dizziness in standing that subsided.     Ambulation / Gait / Stairs / Wheelchair Mobility  Ambulation/Gait Ambulation/Gait assistance: Mod assist, +2 physical assistance Gait Distance (Feet): 2 Feet Assistive device: 2 person hand held assist Gait Pattern/deviations: Step-to pattern, Decreased step length - right, Decreased step length - left, Decreased stride length General Gait Details: Patient required modA +2 for steadying with 2 person HHA to ambulate short distance to chair. Patient with significant unsteadiness requiring external support to maintain balance Gait velocity: decreased Gait velocity interpretation: <1.8 ft/sec, indicate of risk for recurrent falls    Posture / Balance Dynamic Sitting Balance Sitting balance - Comments: EOB for grooming activitivies Balance Overall balance assessment: Needs assistance Sitting-balance support: No upper extremity supported Sitting balance-Leahy Scale: Good Sitting balance - Comments: EOB for grooming activitivies Standing balance support: Bilateral upper  extremity supported Standing balance-Leahy Scale: Poor Standing balance comment: reliant on BUE support and external assistance to maintain standing balance "I feel like I'm falling forward"    Special needs/care consideration BiPAP/CPAP: no CPM: no Continuous Drip IV:  Ceftazidime  Dialysis: yes, Peritoneal Dialysis        Days: evening/nighttime Life Vest: no Oxygen: no Special Bed: no Trach Size: no Wound Vac (area): no      Location: no Skin: has peritoneal catheter left lower abdomen                           Bowel mgmt: last BM: 02/12/19, continent 02/12/19 Bladder mgmt: continent, oliguria.  Diabetic mgmt: yes     Previous Home Environment Living Arrangements: Spouse/significant other Available Help at Discharge: Family, Available PRN/intermittently Type of Home: House Home Layout: One level Home Access: Stairs to enter Entrance Stairs-Rails: None Entrance Stairs-Number of Steps: 2 Bathroom Shower/Tub: Chiropodist: Standard  Discharge Living Setting Plans for Discharge Living Setting: Patient's home, Lives with (comment)(spouse) Type of Home at Discharge: House Discharge Home Layout: One level Discharge Home Access: Stairs to enter Entrance Stairs-Rails: None Entrance Stairs-Number of Steps: 2 Discharge Bathroom Shower/Tub: Tub/shower unit(only spongebathed PTA) Discharge Bathroom Toilet: Standard Discharge Bathroom Accessibility: Yes How Accessible: Accessible via walker(if sidestepped. ) Does the patient have any problems obtaining your medications?: No  Social/Family/Support Systems Patient Roles: Spouse, Other (Comment)(full time employee) Contact Information: (husband Juanda Crumble): [phone]) Anticipated Caregiver: husband in evenings after work Anticipated Ambulance person Information: see above Ability/Limitations of Caregiver: Min A Caregiver Availability: Evenings only(can check in at lunch) Discharge Plan Discussed with Primary Caregiver: Yes Is Caregiver In Agreement with Plan?: Yes Does Caregiver/Family have Issues with Lodging/Transportation while Pt is in Rehab?: No   Goals/Additional Needs Patient/Family Goal for Rehab: PT/OT: Mod I; SLP: NA Expected length of stay: 7-10  days Cultural Considerations: NA Dietary Needs: Carb modified, thin liquids; fluid restriction 1200 mL Equipment Needs: TBD Special Service Needs: Peritoneal Dialysis Pt/Family Agrees to Admission and willing to participate: Yes Program Orientation Provided & Reviewed with Pt/Caregiver Including Roles  & Responsibilities: Yes  Barriers to Discharge: Decreased caregiver support, Home environment access/layout, Other (comments)  Barriers to Discharge Comments: unable to go SNF due to Peritoneal Dialysis   Decrease burden of Care through IP rehab admission: NA   Possible need for SNF placement upon discharge: not anticipated; pt has Mod I goals and an excellent prognosis for further progress through CIR program. Pt's medical needs (peritoneal dialysis) make SNF placement unlikely.   Patient Condition: This patient's medical and functional status has changed since the consult dated: 02/10/19 in which the Rehabilitation Physician determined and documented that the patient's condition is appropriate for intensive rehabilitative care in an inpatient rehabilitation facility. See "History of Present Illness" (above) for medical update. Functional changes are: no changes currently in transfer status at this time. Patient's medical and functional status update has been discussed with the Rehabilitation physician and patient remains appropriate for inpatient rehabilitation. Will admit to inpatient rehab today.  Preadmission Screen Completed By:  [name], 02/13/2019 8:15 AM ______________________________________________________________________   Discussed status with Dr. Naaman Plummer on 02/13/19 at 4:39PM and received telephone approval for admission today.  Admission Coordinator:  [name], time 4:39PM/Date 02/13/19           Cosigned by: Meredith Staggers, MD at 02/13/2019 5:15 PM  Revision History

## 2019-02-13 NOTE — Progress Notes (Signed)
Physical Therapy Treatment Patient Details Name: Heather [name] MRN: [id number] DOB: Oct 15, 1971 Today's Date: 02/13/2019    History of Present Illness Patient is 48 y/o female presenting to hospital with worsening L lower abdominal pain secondary to peritonitis likely dialysis associated. Patient became unresponsive on Feb 11 and code blue was called. CPR and narcan administered and patient regained conciousness. VQ scan showed low probablity of PE. PMH includes ESRD on PD, DMII, HTN, gastroparesis, hyperparathroidism, CKD, anemia, hx of CVA, and L ovarian cyst.     PT Comments    Patient progressing towards goals. Patient required modA +2 to stand from bed and min-modA to stand from [facility]. Patient ambulated short distance to bathroom with use of RW. Patient with decreased gait speed and noticeable fatigue following ambulation. Educated and reviewed seated HEP for bilateral LE strengthening. Current recommendation remains appropriate. Patient will benefit from acute physical therapy to maximize independence and safety with functional mobility.     Follow Up Recommendations  CIR     Equipment Recommendations  None recommended by PT    Recommendations for Other Services       Precautions / Restrictions Precautions Precautions: Fall Restrictions Weight Bearing Restrictions: No    Mobility  Bed Mobility Overal bed mobility: Needs Assistance Bed Mobility: Supine to Sit     Supine to sit: [facility] elevated;Min assist     General bed mobility comments: Patient required minA for bed mobility for trunk control and safety. Required increased time to reach EOB.   Transfers Overall transfer level: Needs assistance Equipment used: Rolling walker (2 wheeled) Transfers: Sit to/from Stand Sit to Stand: Mod assist;+2 physical assistance         General transfer comment: Patient required modA +2 for lift assist to stand with use of RW from bed. Required min-modA for lift asssit to stand  from [facility]. Verbal cues for hand placment prior to standing when using RW.   Ambulation/Gait Ambulation/Gait assistance: Min guard Gait Distance (Feet): 15 Feet Assistive device: Rolling walker (2 wheeled) Gait Pattern/deviations: Step-through pattern;Decreased step length - right;Decreased step length - left;Decreased stride length Gait velocity: decreased Gait velocity interpretation: <1.8 ft/sec, indicate of risk for recurrent falls General Gait Details: Patient ambulated short distance to bathroom with min guard and use of RW for safety. Patient with very slow gait speed and increased fatigue following ambulation.    Stairs             Wheelchair Mobility    Modified Rankin (Stroke Patients Only)       Balance Overall balance assessment: Needs assistance Sitting-balance support: No upper extremity supported Sitting balance-Leahy Scale: Good     Standing balance support: Bilateral upper extremity supported;During functional activity Standing balance-Leahy Scale: Poor Standing balance comment: reliant on BUE support to maintain standing balance                            Cognition Arousal/Alertness: Awake/alert Behavior During Therapy: WFL for tasks assessed/performed Overall Cognitive Status: Within Functional Limits for tasks assessed                                        Exercises General Exercises - Lower Extremity Long Arc Quad: AROM;Both;10 reps;Seated Hip Flexion/Marching: AROM;Both;10 reps;Seated Toe Raises: AROM;10 reps;Seated;Both Heel Raises: AROM;Both;10 reps;Seated    General Comments General comments (skin integrity, edema, etc.):  Patient family in room at beginning of session.       Pertinent Vitals/Pain Pain Assessment: Faces Faces Pain Scale: Hurts little more Pain Location: chest Pain Descriptors / Indicators: Aching;Discomfort Pain Intervention(s): Limited activity within patient's tolerance;Monitored during  session    Home Living                      Prior Function            PT Goals (current goals can now be found in the care plan section) Acute Rehab PT Goals Patient Stated Goal: get better PT Goal Formulation: With patient Time For Goal Achievement: 02/23/19 Potential to Achieve Goals: Good Progress towards PT goals: Progressing toward goals    Frequency    Min 3X/week      PT Plan Current plan remains appropriate    Co-evaluation PT/OT/SLP Co-Evaluation/Treatment: Yes Reason for Co-Treatment: For patient/therapist safety;To address functional/ADL transfers PT goals addressed during session: Mobility/safety with mobility;Proper use of DME;Strengthening/ROM OT goals addressed during session: ADL's and self-care;Proper use of Adaptive equipment and DME      AM-PAC PT "6 Clicks" Mobility   Outcome Measure  Help needed turning from your back to your side while in a flat bed without using bedrails?: A Lot Help needed moving from lying on your back to sitting on the side of a flat bed without using bedrails?: A Lot Help needed moving to and from a bed to a chair (including a wheelchair)?: A Little Help needed standing up from a chair using your arms (e.g., wheelchair or bedside chair)?: A Lot Help needed to walk in hospital room?: A Little Help needed climbing 3-5 steps with a railing? : A Lot 6 Click Score: 14    End of Session Equipment Utilized During Treatment: Gait belt Activity Tolerance: Patient tolerated treatment well Patient left: in chair;with call bell/phone within reach Nurse Communication: Mobility status PT Visit Diagnosis: Unsteadiness on feet (R26.81);Muscle weakness (generalized) (M62.81);Difficulty in walking, not elsewhere classified (R26.2)     Time: 5208-0223 PT Time Calculation (min) (ACUTE ONLY): 27 min  Charges:  $Therapeutic Activity: 8-22 mins                     Heather [name], SPT  Heather [name] 02/13/2019, 4:46 PM

## 2019-02-13 NOTE — Progress Notes (Signed)
Occupational Therapy Treatment Patient Details Name: Heather [name] MRN: [id number] DOB: [birth year] Today's Date: 02/13/2019    History of present illness Patient is 48 y/o female presenting to hospital with worsening L lower abdominal pain secondary to peritonitis likely dialysis associated. Patient became unresponsive on Feb 11 and code blue was called. CPR and narcan administered and patient regained conciousness. PMH includes ESRD on PD, DMII, HTN, gastroparesis, hyperparathroidism, CKD, anemia, hx of CVA, and L ovarian cyst.    OT comments  Pt making progress with functional goals. Patient required mod A +2 from EOB for lift assist and to power up to stand. Cues for correct hand placement, Mod - min A from BSC to RW. Pt donned socks seated in bed with set up and stood at RW to don gown with min A. OT will continue to follow acutely  Follow Up Recommendations  CIR;Supervision/Assistance - 24 hour    Equipment Recommendations  Other (comment)(TBD at next venue of care)    Recommendations for Other Services      Precautions / Restrictions Precautions Precautions: Fall Restrictions Weight Bearing Restrictions: No       Mobility Bed Mobility Overal bed mobility: Needs Assistance Bed Mobility: Supine to Sit     Supine to sit: [facility] elevated;Min assist        Transfers Overall transfer level: Needs assistance Equipment used: Rolling walker (2 wheeled) Transfers: Sit to/from Stand Sit to Stand: Mod assist;+2 physical assistance         General transfer comment: Patient required mod A +2 from EOB for lift assist and to power up to stand. Cues for correct hand placement. Mod - min A from BSC to RW    Balance Overall balance assessment: Needs assistance Sitting-balance support: No upper extremity supported Sitting balance-Leahy Scale: Good     Standing balance support: Bilateral upper extremity supported;During functional activity Standing balance-Leahy Scale:  Poor                             ADL either performed or assessed with clinical judgement   ADL Overall ADL's : Needs assistance/impaired     Grooming: Wash/dry hands;Wash/dry face;Min guard;Standing   Upper Body Bathing: Min guard;Sitting Upper Body Bathing Details (indicate cue type and reason): simulated     Upper Body Dressing : Minimal assistance;Standing Upper Body Dressing Details (indicate cue type and reason): donning gown on back     Toilet Transfer: Moderate assistance;RW;BSC;Ambulation   Toileting- Clothing Manipulation and Hygiene: Moderate assistance;Sit to/from stand       Functional mobility during ADLs: Moderate assistance;Rolling walker General ADL Comments: decreased activity tolerance, slow pace of movement. Initiated energy conservation techniques with handout provided     Vision Patient Visual Report: No change from baseline     Perception     Praxis      Cognition Arousal/Alertness: Awake/alert Behavior During Therapy: WFL for tasks assessed/performed Overall Cognitive Status: Within Functional Limits for tasks assessed                                          Exercises     Shoulder Instructions       General Comments      Pertinent Vitals/ Pain       Pain Assessment: Faces Faces Pain Scale: Hurts little more Pain Intervention(s): Limited activity within patient's  tolerance;Monitored during session;Repositioned  Home Living                                          Prior Functioning/Environment              Frequency  Min 3X/week        Progress Toward Goals  OT Goals(current goals can now be found in the care plan section)  Progress towards OT goals: Progressing toward goals     Plan Discharge plan remains appropriate    Co-evaluation    PT/OT/SLP Co-Evaluation/Treatment: Yes Reason for Co-Treatment: For patient/therapist safety   OT goals addressed during  session: ADL's and self-care;Proper use of Adaptive equipment and DME      AM-PAC OT "6 Clicks" Daily Activity     Outcome Measure   Help from another person eating meals?: None Help from another person taking care of personal grooming?: A Little   Help from another person bathing (including washing, rinsing, drying)?: A Lot Help from another person to put on and taking off regular upper body clothing?: A Little Help from another person to put on and taking off regular lower body clothing?: A Lot 6 Click Score: 14    End of Session Equipment Utilized During Treatment: Gait belt;Rolling walker;Other (comment)(BSC)  OT Visit Diagnosis: Unsteadiness on feet (R26.81);Other abnormalities of gait and mobility (R26.89);Muscle weakness (generalized) (M62.81);History of falling (Z91.81)   Activity Tolerance Patient limited by fatigue   Patient Left in bed;with call bell/phone within reach;with family/visitor present   Nurse Communication          Time: 2725-3664 OT Time Calculation (min): 22 min  Charges: OT Treatments $Self Care/Home Management : 8-22 mins     [name] 02/13/2019, 2:44 PM

## 2019-02-13 NOTE — Progress Notes (Signed)
Meredith Staggers, MD  Physician  Physical Medicine and Rehabilitation  Consult Note  Signed  Date of Service:  02/10/2019 10:42 AM       Related encounter: ED to Hosp-Admission (Discharged) from 02/05/2019 in [facility]      Signed      Expand All Collapse All         Physical Medicine and Rehabilitation Consult Reason for Consult:weakness and decreased functional mobility Referring Physician: Jerald Kief   HPI: Heather [name] is a 48 y.o. female with history of right cerebellar infarct in 11/2017, diabetes, and ESRD on PD since July 2019 who developed worsening abdominal pain and was admitted on 02/05/2019 with bacterial peritonitis. Placed on IV abx and pain control addressed. On 2/11 pt became unresponsive after dose of IV dilaudid with Code Blue, narcan and CPR to resuscitate. Pt currently on vanc/fortaz for abx therapy.    Pt tells me she has had problems with her gait and balance since her stroke. She used a walker at times but also furniture walked. She had to frequently take breaks while walking to rest due to balance and being "winded." She continued to work and drive prior to this hospitalization.   Pt has been up with therapy and has demonstrated ongoing deficits with balance, gait and mobility. PM&R was asked to assess patient for ongoing rehab needs.    Review of Systems  Constitutional: Positive for fever and malaise/fatigue.  HENT: Negative for hearing loss.   Eyes: Negative for blurred vision and double vision.  Respiratory: Positive for shortness of breath.   Cardiovascular: Negative for chest pain.  Gastrointestinal: Positive for abdominal pain and nausea.  Genitourinary: Negative for dysuria.  Musculoskeletal: Positive for back pain and falls.  Skin: Negative for rash.  Neurological: Positive for dizziness, focal weakness and weakness.  Psychiatric/Behavioral: Negative for depression.       Past Medical History:    Diagnosis Date  . Antiplatelet or antithrombotic long-term use: Plavix 06/30/2017  . B12 deficiency 05/10/2014  . Blood transfusion without reported diagnosis   . Chronic constipation   . CVA (cerebral vascular accident) ([place]), nonhemorrhagic, inferior right cerebellum 12/03/2017  . Cyst of left ovary 01/12/2018  . Diabetic neuropathy, painful ([place]), on low dose Gabapentin 07/13/2013  . DM (diabetes mellitus), type 2, uncontrolled, with renal complications ([place]) 60/73/7106  . DM2 (diabetes mellitus, type 2) ([place])   . Dysfunction of left eustachian tube, with pusatile tinnitus 11/24/2017  . Dyslipidemia associated with type 2 diabetes mellitus ([place]) 06/25/2016  . ESRD (end stage renal disease) on dialysis ([place])   . ESRD with anemia ([place])   . Fibromyalgia   . Gastroparesis due to DM   . GERD (gastroesophageal reflux disease)   . Hypertension associated with diabetes ([place]) 02/19/2011  . IBS (irritable bowel syndrome)   . Left-sided weakness 12/10/2017   .  [name] Leiomyoma of uterus   . Pancreatitis   . Proliferative diabetic retinopathy ([place]) 09/29/2015  . 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 Reactive depression 12/10/2017   .  [name] Right-sided low back pain without sciatica 12/08/2015  . Secondary hyperthyroidism 11/27/2013  . 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 Thromboembolism ([place]) 04/05/2018  . Upper airway cough syndrome, with recs to stay off ACE and take Pepcid q hs 11/15/2016        Past Surgical History:  Procedure Laterality Date  . ARTERY REPAIR Right 02/17/2018   Procedure: EXPLORATION OF RIGHT BRACHIAL  ARTERY;  Surgeon: Conrad [place], MD;  Location: [place];  Service: Vascular;  Laterality: Right;  . ARTERY REPAIR Right 02/17/2018   Procedure: BRACHIAL ARTERY EXPLORATION AND TRHOMBECTOMY;  Surgeon: Conrad Se[place], MD;  Location: [facility] OR;  Service: Vascular;  Laterality: Right;  . AV FISTULA PLACEMENT Left 05/09/2017   Procedure: INSERTION  OF ARTERIOVENOUS (AV) GRAFT ARM (ARTEGRAFT);  Surgeon: Conrad [place], MD;  Location: [facility] OR;  Service: Vascular;  Laterality: Left;  . AV FISTULA PLACEMENT Right 02/17/2018   Procedure: INSERTION OF ARTERIOVENOUS (AV) GORE-TEX GRAFT ARM RIGHT UPPER ARM;  Surgeon: Conrad F[place], MD;  Location: [place];  Service: Vascular;  Laterality: Right;  . BASCILIC VEIN TRANSPOSITION Left 08/31/2016   Procedure: BASILIC VEIN TRANSPOSITION FIRST STAGE;  Surgeon: Conrad Bee, MD;  Location: [place];  Service: Vascular;  Laterality: Left;  . CENTRAL VENOUS CATHETER INSERTION Left 07/24/2018   Procedure: INSERTION CENTRAL LINE ADULT;  Surgeon: Conrad Groves, MD;  Location: [place];  Service: Vascular;  Laterality: Left;  . EYE SURGERY     secondary to diabetic retinopathy   . FOOT SURGERY Right    t"ook bone out- maybe hammer toe"  . INSERTION OF DIALYSIS CATHETER Left 07/24/2018   Procedure: INSERTION OF TUNNELED DIALYSIS CATHETER;  Surgeon: Conrad [place], MD;  Location: [place];  Service: Vascular;  Laterality: Left;  . IR FLUORO GUIDE CV LINE RIGHT  07/21/2018  . IR US GUIDE VASC ACCESS RIGHT  07/21/2018  . LIGATION OF ARTERIOVENOUS  FISTULA Left 05/09/2017   Procedure: LIGATION OF ARTERIOVENOUS  FISTULA;  Surgeon: Conrad [place], MD;  Location: [place];  Service: Vascular;  Laterality: Left;  . LOOP RECORDER INSERTION N/A 12/05/2017   Procedure: LOOP RECORDER INSERTION;  Surgeon: Evans Lance, MD;  Location: [place] CV LAB;  Service: Cardiovascular;  Laterality: N/A;  . MYOMECTOMY    . REMOVAL OF GRAFT Right 02/17/2018   Procedure: REMOVAL OF RIGHT UPPER ARM ARTERIOVENOUS GRAFT;  Surgeon: Conrad Boswell, MD;  Location: [place];  Service: Vascular;  Laterality: Right;  . REVISION OF ARTERIOVENOUS GORETEX GRAFT Left 07/24/2018   Procedure: REDO ARTERIOVENOUS GORETEX GRAFT;  Surgeon: Conrad [place], MD;  Location: [place];  Service: Vascular;  Laterality: Left;  . TEE WITHOUT CARDIOVERSION N/A 12/05/2017    Procedure: TRANSESOPHAGEAL ECHOCARDIOGRAM (TEE);  Surgeon: Sanda Klein, MD;  [facility] ENDOSCOPY;  Service: Cardiovascular;  Laterality: N/A;  . UPPER EXTREMITY VENOGRAPHY Left 07/13/2018   Procedure: UPPER EXTREMITY VENOGRAPHY - Central & Left Arm;  Surgeon: Conrad Slater-Marietta, MD;  Location: [place] CV LAB;  Service: Cardiovascular;  Laterality: Left;  . UTERINE FIBROID SURGERY    . VITRECTOMY Bilateral         Family History  Problem Relation Age of Onset  . Colon polyps Mother   . Diabetes Mother   . Heart murmur Father        history essentially unknown  . Hypertension Father   . Diabetes Sister   . Kidney failure Sister        kidney transplant  . Diabetes Brother   . Retinal degeneration Brother   . Heart murmur Son   . Stroke Paternal Grandmother   . Diabetes Sister    Social History:  reports that she has never smoked. She has never used smokeless tobacco. She reports that she does not drink alcohol or use drugs. Allergies:       Allergies  Allergen  Reactions  . Ibuprofen Other (See Comments)    CKD stage 3. Should avoid.  . Dilaudid [Hydromorphone Hcl] Itching and Other (See Comments)    Can take with Benadryl.  . Tramadol Itching         Medications Prior to Admission  Medication Sig Dispense Refill  . baclofen (LIORESAL) 10 MG tablet TAKE 1/2 TABLET BY MOUTH 2 (TWO) TIMES DAILY AS NEEDED FOR MUSCLE SPASMS. 90 tablet 1  . carvedilol (COREG) 6.25 MG tablet Take 1 tablet (6.25 mg total) by mouth 2 (two) times daily with a meal. 60 tablet 6  . gabapentin (NEURONTIN) 300 MG capsule Take 1 capsule (300 mg total) by mouth 2 (two) times daily. (Patient taking differently: Take 300 mg by mouth 2 (two) times daily as needed (pain). ) 180 capsule 1  . insulin aspart (NOVOLOG) 100 UNIT/ML injection Inject 6-8 Units into the skin 3 (three) times daily before meals. 8 UNITS WITH BREAKFAST, 6 UNITS WITH LUNCH, & 8 UNITS WITH SUPPER    . insulin  degludec (TRESIBA FLEXTOUCH) 100 UNIT/ML SOPN FlexTouch Pen Inject 0.2 mLs (20 Units total) into the skin daily with lunch. (Patient taking differently: Inject 28 Units into the skin daily with lunch. ) 1 pen 3  . lidocaine-prilocaine (EMLA) cream Apply 1 application topically as directed. For Dialysis use  11  . lubiprostone (AMITIZA) 24 MCG capsule Take 1 capsule (24 mcg total) by mouth 2 (two) times daily with a meal. (Patient taking differently: Take 24 mcg by mouth 2 (two) times daily with a meal. As needed) 60 capsule 5  . oxyCODONE (ROXICODONE) 5 MG immediate release tablet Take 0.5 tablets (2.5 mg total) by mouth every 6 (six) hours as needed for severe pain. 20 tablet 0  . promethazine (PHENERGAN) 25 MG tablet Take 25 mg by mouth every 12 (twelve) hours as needed for nausea.    . simvastatin (ZOCOR) 10 MG tablet Take 0.5 tablets (5 mg total) by mouth at bedtime. 1/2 po q hs - GFR 14 45 tablet 1  . metroNIDAZOLE (FLAGYL) 500 MG tablet Take 1 tablet (500 mg total) by mouth 2 (two) times daily for 7 days. 14 tablet 0    Home: Home Living Family/patient expects to be discharged to:: Private residence Living Arrangements: Spouse/significant other Available Help at Discharge: Family, Available PRN/intermittently Type of Home: House Home Access: Stairs to enter Technical brewer of Steps: 2 Entrance Stairs-Rails: None Home Layout: One level Bathroom Shower/Tub: Chiropodist: Standard Home Equipment: Environmental consultant - 2 wheels, Cane - single point  Functional History: Prior Function Level of Independence: Independent Comments: still working Functional Status:  Mobility: Bed Mobility Overal bed mobility: Needs Assistance Bed Mobility: Supine to Sit Supine to sit: Mod assist, +2 for physical assistance General bed mobility comments: Patient required modA +2 for trunk control to sit EOB. Verbal cues to reach for hand rail.  Transfers Overall transfer level: Needs  assistance Transfers: Sit to/from Stand Sit to Stand: Mod assist, +2 physical assistance General transfer comment: Patient required modA +2 for lift assist to stand. Patient with dizziness in standing that subsided.  Ambulation/Gait Ambulation/Gait assistance: Mod assist, +2 physical assistance Gait Distance (Feet): 2 Feet Assistive device: 2 person hand held assist Gait Pattern/deviations: Step-to pattern, Decreased step length - right, Decreased step length - left, Decreased stride length General Gait Details: Patient required modA +2 for steadying with 2 person HHA to ambulate short distance to chair. Patient with significant unsteadiness requiring external support  to maintain balance Gait velocity: decreased Gait velocity interpretation: <1.8 ft/sec, indicate of risk for recurrent falls  ADL:  Cognition: Cognition Overall Cognitive Status: Within Functional Limits for tasks assessed Orientation Level: Oriented X4 Cognition Arousal/Alertness: Awake/alert Behavior During Therapy: WFL for tasks assessed/performed Overall Cognitive Status: Within Functional Limits for tasks assessed  Blood pressure (!) 165/74, pulse 85, temperature 98.2 F (36.8 C), temperature source Oral, resp. rate 18, weight 108.8 kg, last menstrual period 12/13/2018, SpO2 100 %. Physical Exam  Constitutional: [name] oriented to person, place, and time. No distress.  Obese   HENT:  Head: Normocephalic.  Eyes: Pupils are equal, round, and reactive to light. Conjunctivae are normal.  Neck: Normal range of motion.  Cardiovascular: Normal rate and regular rhythm. Exam reveals no friction rub.  No murmur heard. Respiratory: Effort normal. No respiratory distress.  GI: She exhibits no distension. There is abdominal tenderness.  PD site dressed  Musculoskeletal: Normal range of motion.        General: Edema present.  Neurological: [name] alert and oriented to person, place, and time.  Decreased [place] RUE and  RLE. Speech clear. Had difficulty tracking. Visual fields intact. Strength 3-4/5 UE prox to distal and 3/5 LE's. No focal sensory loss  Skin: Skin is warm. [name] not diaphoretic.  Psychiatric: She has a normal mood and affect. Her behavior is normal.    LabResultsLast24Hours       Results for orders placed or performed during the hospital encounter of 02/05/19 (from the past 24 hour(s))  Glucose, capillary     Status: Abnormal   Collection Time: 02/09/19 11:09 AM  Result Value Ref Range   Glucose-Capillary 117 (H) 70 - 99 mg/dL  Glucose, capillary     Status: None   Collection Time: 02/09/19  4:46 PM  Result Value Ref Range   Glucose-Capillary 77 70 - 99 mg/dL  Glucose, capillary     Status: Abnormal   Collection Time: 02/09/19  9:15 PM  Result Value Ref Range   Glucose-Capillary 207 (H) 70 - 99 mg/dL  CBC     Status: Abnormal   Collection Time: 02/10/19  6:46 AM  Result Value Ref Range   WBC 8.4 4.0 - 10.5 K/uL   RBC 2.53 (L) 3.87 - 5.11 MIL/uL   Hemoglobin 7.4 (L) 12.0 - 15.0 g/dL   HCT 24.1 (L) 36.0 - 46.0 %   MCV 95.3 80.0 - 100.0 fL   MCH 29.2 26.0 - 34.0 pg   MCHC 30.7 30.0 - 36.0 g/dL   RDW 13.6 11.5 - 15.5 %   Platelets 331 150 - 400 K/uL   nRBC 0.5 (H) 0.0 - 0.2 %  Basic metabolic panel     Status: Abnormal   Collection Time: 02/10/19  6:46 AM  Result Value Ref Range   Sodium 133 (L) 135 - 145 mmol/L   Potassium 3.0 (L) 3.5 - 5.1 mmol/L   Chloride 99 98 - 111 mmol/L   CO2 24 22 - 32 mmol/L   Glucose, Bld 176 (H) 70 - 99 mg/dL   BUN 41 (H) 6 - 20 mg/dL   Creatinine, Ser 7.66 (H) 0.44 - 1.00 mg/dL   Calcium 7.0 (L) 8.9 - 10.3 mg/dL   GFR calc non Af Amer 6 (L) >60 mL/min   GFR calc Af Amer 7 (L) >60 mL/min   Anion gap 10 5 - 15  Glucose, capillary     Status: Abnormal   Collection Time: 02/10/19  7:32  AM  Result Value Ref Range   Glucose-Capillary 140 (H) 70 - 99 mg/dL     ImagingResults(Last48hours)  No results  found.    Assessment/Plan: Diagnosis: 48 yo female with baseline gait disorder after previous right cerebellar infarct who is now having further problems with mobility and self-care after being hospitalized for bacterial peritonitis 1. Does the need for close, 24 hr/day medical supervision in concert with the patient's rehab needs make it unreasonable for this patient to be served in a less intensive setting? Yes 2. Co-Morbidities requiring supervision/potential complications: ESRD, ID considerations, nutrition, wound care 3. Due to bladder management, bowel management, safety, skin/wound care, disease management, medication administration, pain management and patient education, does the patient require 24 hr/day rehab nursing? Yes 4. Does the patient require coordinated care of a physician, rehab nurse, PT (1-2 hrs/day, 5 days/week) and OT (1-2 hrs/day, 5 days/week) to address physical and functional deficits in the context of the above medical diagnosis(es)? Yes Addressing deficits in the following areas: balance, endurance, locomotion, strength, transferring, bowel/bladder control, bathing, dressing, feeding, grooming, toileting and psychosocial support 5. Can the patient actively participate in an intensive therapy program of at least 3 hrs of therapy per day at least 5 days per week? Yes 6. The potential for patient to make measurable gains while on inpatient rehab is excellent 7. Anticipated functional outcomes upon discharge from inpatient rehab are modified independent  with PT, modified independent with OT, n/a with SLP. 8. Estimated rehab length of stay to reach the above functional goals is: 7-10 days 9. Anticipated D/C setting: Home 10. Anticipated post D/C treatments: [place] therapy 11. Overall Rehab/Functional Prognosis: excellent  RECOMMENDATIONS: This patient's condition is appropriate for continued rehabilitative care in the following setting: CIR Patient has agreed to participate  in recommended program. Yes Note that insurance prior authorization may be required for reimbursement for recommended care.  Comment: Pt is motivated and was working and independent prior to admission. Rehab Admissions Coordinator to follow up.  Thanks,  Meredith Staggers, MD, Mellody Drown  I have personally performed a face to face diagnostic evaluation of this patient. Additionally, I have reviewed and concur with the physician assistant's documentation above.    Meredith Staggers, MD 02/10/2019         Routing History

## 2019-02-13 NOTE — Progress Notes (Signed)
Pt arrived to 47M and PD Nurse at bedside to set up patient for dialysis.

## 2019-02-13 NOTE — PMR Pre-admission (Signed)
PMR Admission Coordinator Pre-Admission Assessment  Patient: Heather [name] is an 48 y.o., female MRN: [id number] DOB: 10/20/1971 Height:   Weight: 111.7 kg              Insurance Information HMO:     PPO:      PCP:      IPA:      80/20:      OTHER: YES PRIMARY: [employer]      Policy#: [id number]      Subscriber: Patient CM Name: Heather [name]      Phone#: [phone] [id number]     Fax#: [phone] Pre-Cert#: [id number]      Employer:  Josem Kaufmann provided by Maren Reamer on 2/18 for admit to CIR. Auth 2/18 with last covered date 2/24; clinical updates due 2/25 to [employer] (p): [phone]; (f): [phone] Benefits:  Phone #: NA     Name: Online Portal Eff. Date: 12/27/18     Deduct: $2,600 (met $690.05)      Out of Pocket Max: $4,000 ($735.69); includes deductible      Life Max: NA CIR: 80%/20%      SNF: 80%/20%; 100 day limit Outpatient: 80%, per necessity     Co-Pay: 20% Home Health: 80%, per necessity      Co-Pay: 20% DME: 100%     Co-Pay:  Providers:  SECONDARY:       Policy#:       Subscriber:  CM Name:       Phone#:      Fax#:  Pre-Cert#:       Employer:  Benefits:  Phone #:      Name:  Eff. Date:      Deduct:       Out of Pocket Max:       Life Max:  CIR:       SNF:  Outpatient:      Co-Pay:  Home Health:       Co-Pay:  DME:      Co-Pay:  Medicaid Application Date:       Case Manager:  Disability Application Date:       Case Worker:   Emergency Contact Information Contact Information    Name Relation Home Work Mobile   Zayas,Charles Spouse [phone]       Current Medical History  Patient Admitting Diagnosis: 48 yo female with baseline gait disorder after previous right cerebellar infarct who is now having further problems with mobility and self-care after being hospitalized for bacterial peritonitis  History of Present Illness: Heather [name] is a 48 year old female with history of 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  Pt was found to have bacterial peritonitis and started on IV antibiotics as well as IV pain meds.  Pt also found to have bacterial vaginosis and she was started on Flagyl but this was DC'd due to ongoing issues with nausea.  Pelvic ultrasound revealed 2.8 cm left ovarian cyst--with needs for follow-up ultrasound in 6 to 12 weeks. On 2/11, she arrested requiring CODE BLUE and CPR to resuscitate past dose of IV Dilaudid.  She responded to Narcan and EKG without acute findings.  Abdominal pain improving and nausea and vomiting has resolved.  PD fluid now grossly clear and cultures negative so far.  Anemia of chronic disease  to improve with 2 units PRBC.    She reported increase in abdominal pain with SOB on 2/17 therefore V/Q scan done and showed very low probability for PE. CXR done and was negative for pulmonary edema but patient with rales RLL and question of SOB due to fluid overload. Question CT abdomen if pain continues to be ongoing. Per Dr. Tyrell Antonio, no CT needed at this time and pt ready for DC to CIR.   Therapy ongoing and patient with significant balance deficits as well as orthostatic changes due to debility.  CIR recommended for follow-up therapy. Pt is to be admitted to CIR on 02/13/19.        Past Medical History  Past Medical History:  Diagnosis Date  . Antiplatelet or antithrombotic long-term use: Plavix 06/30/2017  . B12 deficiency 05/10/2014  . Blood transfusion without reported diagnosis   . Chronic constipation   . CVA (cerebral vascular accident) ([place]), nonhemorrhagic, inferior right cerebellum 12/03/2017  . Cyst of left ovary 01/12/2018  . Diabetic neuropathy, painful ([place]), on low dose Gabapentin 07/13/2013  . DM (diabetes mellitus), type 2, uncontrolled, with renal  complications ([place]) 97/01/6377  . DM2 (diabetes mellitus, type 2) ([place])   . Dysfunction of left eustachian tube, with pusatile tinnitus 11/24/2017  . Dyslipidemia associated with type 2 diabetes mellitus ([place]) 06/25/2016  . ESRD (end stage renal disease) on dialysis [facility])    On hemodialysis in July 2019 via [facility] then switched to [facility] in Nov 2019.   [name] ESRD with anemia ([place])   . Fibromyalgia   . Gastroparesis due to DM   . GERD (gastroesophageal reflux disease)   . Hypertension associated with diabetes ([place]) 02/19/2011  . IBS (irritable bowel syndrome)   . Left-sided weakness 12/10/2017   .  [name] Leiomyoma of uterus   . Pancreatitis   . Proliferative diabetic retinopathy ([place]) 09/29/2015  . 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 Reactive depression 12/10/2017   .  [name] Right-sided low back pain without sciatica 12/08/2015  . Secondary hyperthyroidism 11/27/2013  . 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 Thromboembolism ([place]) 04/05/2018  . Upper airway cough syndrome, with recs to stay off ACE and take Pepcid q hs 11/15/2016    Family History  family history includes Colon polyps in her mother; Diabetes in her brother, mother, sister, and sister; Heart murmur in her father and son; Hypertension in her father; Kidney failure in her sister; Retinal degeneration in her brother; Stroke in her paternal grandmother.  Prior Rehab/Hospitalizations:  Has the patient had major surgery during 100 days prior to admission? No  Current Medications   Current Facility-Administered Medications:  .  carvedilol (COREG) tablet 6.25 mg, 6.25 mg, Oral, BID WC, Lady Deutscher, MD, 6.25 mg at 02/13/19 0751 .  cefTAZidime (FORTAZ) 1 g in sodium chloride 0.9 % 100 mL IVPB, 1 g, Intravenous, Q24H, Pierce, Dwayne A, RPH, Last Rate: 200 mL/hr at 02/12/19 1501, 1 g at 02/12/19 1501 .  Darbepoetin Alfa (ARANESP) injection 60 mcg, 60 mcg, Subcutaneous, Q Wed-1800, Ernest Haber, PA-C, 60 mcg  at 02/07/19 1820 .  dialysis solution 4.25% low-MG/low-CA dianeal solution, , Intraperitoneal, Q24H, Schertz, Robert, MD .  diphenhydrAMINE (BENADRYL) injection 12.5 mg, 12.5 mg, Intravenous, Q6H PRN, Lady Deutscher, MD, 12.5 mg at 02/12/19 1737 .  feeding supplement (PRO-STAT SUGAR FREE 64) liquid 30 mL, 30 mL, Oral, BID, Lynnda Child, PA-C, 30 mL at 02/11/19 5885 .  gabapentin (NEURONTIN) capsule 300 mg,  300 mg, Oral, BID PRN, Lady Deutscher, MD .  gentamicin cream (GARAMYCIN) 0.1 % 1 application, 1 application, Topical, Daily, Roney Jaffe, MD, 1 application at 16/57/90 1842 .  heparin 2,500 Units in dialysis solution 4.25% low-MG/low-CA 5,000 mL dialysis solution, , Peritoneal Dialysis, PRN, Hammons, Theone Murdoch, RPH .  heparin injection 5,000 Units, 5,000 Units, Subcutaneous, Q8H, Lady Deutscher, MD, 5,000 Units at 02/13/19 0525 .  insulin aspart (novoLOG) injection 0-15 Units, 0-15 Units, Subcutaneous, TID WC, Lady Deutscher, MD, 5 Units at 02/13/19 [phone] .  insulin aspart (novoLOG) injection 6 Units, 6 Units, Subcutaneous, Q lunch, Lady Deutscher, MD, 6 Units at 02/11/19 1334 .  insulin aspart (novoLOG) injection 8 Units, 8 Units, Subcutaneous, Q breakfast, Lady Deutscher, MD, 8 Units at 02/13/19 0750 .  insulin aspart (novoLOG) injection 8 Units, 8 Units, Subcutaneous, Q supper, Lady Deutscher, MD, 8 Units at 02/11/19 1755 .  insulin glargine (LANTUS) injection 28 Units, 28 Units, Subcutaneous, Q lunch, Lady Deutscher, MD, 28 Units at 02/12/19 1450 .  lidocaine-prilocaine (EMLA) cream 1 application, 1 application, Topical, UD, Sheehan, Wyatt Haste, MD .  lubiprostone (AMITIZA) capsule 24 mcg, 24 mcg, Oral, BID PRN, Lady Deutscher, MD .  metroNIDAZOLE (METROGEL) 0.75 % vaginal gel 1 Applicatorful, 1 Applicatorful, Vaginal, QHS, Regalado, Belkys A, MD, 1 Applicatorful at 38/32/91 2307 .  ondansetron (ZOFRAN) injection 4 mg, 4 mg, Intravenous, Q6H PRN,  Regalado, Belkys A, MD, 4 mg at 02/08/19 1248 .  polyethylene glycol (MIRALAX / GLYCOLAX) packet 17 g, 17 g, Oral, Daily, Florencia Reasons, MD, 17 g at 02/12/19 1507 .  promethazine (PHENERGAN) tablet 25 mg, 25 mg, Oral, Q12H PRN, Lady Deutscher, MD, 25 mg at 02/05/19 2119 .  senna-docusate (Senokot-S) tablet 1 tablet, 1 tablet, Oral, BID, Florencia Reasons, MD, 1 tablet at 02/12/19 1506 .  simvastatin (ZOCOR) tablet 5 mg, 5 mg, Oral, QHS, Lady Deutscher, MD, 5 mg at 02/12/19 2307 .  sodium chloride flush (NS) 0.9 % injection 3 mL, 3 mL, Intravenous, Q12H, Lady Deutscher, MD, 3 mL at 02/12/19 2305 .  traMADol (ULTRAM) tablet 25 mg, 25 mg, Oral, Q6H PRN, Regalado, Belkys A, MD, 25 mg at 02/12/19 2306 .  vancomycin variable dose per unstable renal function (pharmacist dosing), , Does not apply, See admin instructions, Theotis Burrow, RPH .  Vitamin D (Ergocalciferol) (DRISDOL) capsule 50,000 Units, 50,000 Units, Oral, Q7 days, Regalado, Belkys A, MD, 50,000 Units at 02/13/19 0751  Patients Current Diet:  Diet Order            Diet Carb Modified Fluid consistency: Thin; Room service appropriate? Yes; Fluid restriction: 1200 mL Fluid  Diet effective now              Precautions / Restrictions Precautions Precautions: Fall Restrictions Weight Bearing Restrictions: No   Has the patient had 2 or more falls or a fall with injury in the past year?Yes  Prior Activity Level Community (5-7x/wk): worked full time for [employer], drove PTA; active; did her own Peritoneal Dialsysis in evenings  [place] / Equipment Home Equipment: Environmental consultant - 2 wheels, [place] - single point  Prior Device Use: Indicate devices/aids used by the patient prior to current illness, exacerbation or injury? Rollator; infrequently used SPC  Prior Functional Level Prior Function Level of Independence: Independent Comments: still working  Self Care: Did the patient need help bathing, dressing, using the  toilet or eating?  Independent  Indoor Mobility: Did the patient need assistance with walking from room to room (with or without device)? Independent  Stairs: Did the patient need assistance with internal or external stairs (with or without device)? Needed some help  Functional Cognition: Did the patient need help planning regular tasks such as shopping or remembering to take medications? Independent  Current Functional Level Cognition  Overall Cognitive Status: Within Functional Limits for tasks assessed Orientation Level: Oriented X4    Extremity Assessment (includes Sensation/Coordination)  Upper Extremity Assessment: Generalized weakness  Lower Extremity Assessment: Defer to PT evaluation    ADLs  Overall ADL's : Needs assistance/impaired Eating/Feeding: Modified independent, Sitting Grooming: Wash/dry hands, Wash/dry face, Oral care, Set up, Sitting Grooming Details (indicate cue type and reason): EOB, unable to maintain standing at this time Upper Body Bathing: Minimal assistance Lower Body Bathing: Minimal assistance, Sitting/lateral leans Upper Body Dressing : Minimal assistance Lower Body Dressing: Minimal assistance Lower Body Dressing Details (indicate cue type and reason): increased time, able to don/doff socks EOB - requires assist for items that are typically sit<>stand Toilet Transfer: Moderate assistance, +2 for physical assistance, +2 for safety/equipment, Stand-pivot, BSC, RW Toileting- Clothing Manipulation and Hygiene: Maximal assistance, Sit to/from stand Toileting - Clothing Manipulation Details (indicate cue type and reason): Pt requires BUE for balance, peri care requires assist from external person Functional mobility during ADLs: Moderate assistance, +2 for safety/equipment, Rolling walker General ADL Comments: decreased activity tolerance,     Mobility  Overal bed mobility: Needs Assistance Bed Mobility: Supine to Sit Supine to sit: Mod assist, +2 for  safety/equipment, HOB elevated General bed mobility comments: assist for trunk elevation, +2 for safety - use of bed rails and elevated HOB    Transfers  Overall transfer level: Needs assistance Equipment used: Rolling walker (2 wheeled) Transfers: Sit to/from Stand Sit to Stand: Mod assist, +2 physical assistance General transfer comment: Patient required modA +2 for lift assist to stand. Patient with dizziness in standing that subsided.     Ambulation / Gait / Stairs / Wheelchair Mobility  Ambulation/Gait Ambulation/Gait assistance: Mod assist, +2 physical assistance Gait Distance (Feet): 2 Feet Assistive device: 2 person hand held assist Gait Pattern/deviations: Step-to pattern, Decreased step length - right, Decreased step length - left, Decreased stride length General Gait Details: Patient required modA +2 for steadying with 2 person HHA to ambulate short distance to chair. Patient with significant unsteadiness requiring external support to maintain balance Gait velocity: decreased Gait velocity interpretation: <1.8 ft/sec, indicate of risk for recurrent falls    Posture / Balance Dynamic Sitting Balance Sitting balance - Comments: EOB for grooming activitivies Balance Overall balance assessment: Needs assistance Sitting-balance support: No upper extremity supported Sitting balance-Leahy Scale: Good Sitting balance - Comments: EOB for grooming activitivies Standing balance support: Bilateral upper extremity supported Standing balance-Leahy Scale: Poor Standing balance comment: reliant on BUE support and external assistance to maintain standing balance "I feel like I'm falling forward"    Special needs/care consideration BiPAP/CPAP: no CPM: no Continuous Drip IV: Ceftazidime  Dialysis: yes, Peritoneal Dialysis        Days: evening/nighttime Life Vest: no Oxygen: no Special Bed: no Trach Size: no Wound Vac (area): no      Location: no Skin: has peritoneal catheter left  lower abdomen                           Bowel mgmt: last BM: 02/12/19, continent 02/12/19 Bladder mgmt: continent, oliguria.  Diabetic mgmt: yes     Previous Home Environment Living Arrangements: Spouse/significant other Available Help at Discharge: Family, Available PRN/intermittently Type of Home: House Home Layout: One level Home Access: Stairs to enter Entrance Stairs-Rails: None Entrance Stairs-Number of Steps: 2 Bathroom Shower/Tub: Chiropodist: Standard  Discharge Living Setting Plans for Discharge Living Setting: Patient's home, Lives with (comment)(spouse) Type of Home at Discharge: House Discharge Home Layout: One level Discharge Home Access: Stairs to enter Entrance Stairs-Rails: None Entrance Stairs-Number of Steps: 2 Discharge Bathroom Shower/Tub: Tub/shower unit(only spongebathed PTA) Discharge Bathroom Toilet: Standard Discharge Bathroom Accessibility: Yes How Accessible: Accessible via walker(if sidestepped. ) Does the patient have any problems obtaining your medications?: No  Social/Family/Support Systems Patient Roles: Spouse, Other (Comment)(full time employee) Contact Information: (husband Juanda Crumble): [phone]) Anticipated Caregiver: husband in evenings after work Anticipated Ambulance person Information: see above Ability/Limitations of Caregiver: Min A Caregiver Availability: Evenings only(can check in at lunch) Discharge Plan Discussed with Primary Caregiver: Yes Is Caregiver In Agreement with Plan?: Yes Does Caregiver/Family have Issues with Lodging/Transportation while Pt is in Rehab?: No   Goals/Additional Needs Patient/Family Goal for Rehab: PT/OT: Mod I; SLP: NA Expected length of stay: 7-10 days Cultural Considerations: NA Dietary Needs: Carb modified, thin liquids; fluid restriction 1200 mL Equipment Needs: TBD Special Service Needs: Peritoneal Dialysis Pt/Family Agrees to Admission and willing to participate:  Yes Program Orientation Provided & Reviewed with Pt/Caregiver Including Roles  & Responsibilities: Yes  Barriers to Discharge: Decreased caregiver support, Home environment access/layout, Other (comments)  Barriers to Discharge Comments: unable to go SNF due to Peritoneal Dialysis   Decrease burden of Care through IP rehab admission: NA   Possible need for SNF placement upon discharge: not anticipated; pt has Mod I goals and an excellent prognosis for further progress through CIR program. Pt's medical needs (peritoneal dialysis) make SNF placement unlikely.   Patient Condition: This patient's medical and functional status has changed since the consult dated: 02/10/19 in which the Rehabilitation Physician determined and documented that the patient's condition is appropriate for intensive rehabilitative care in an inpatient rehabilitation facility. See "History of Present Illness" (above) for medical update. Functional changes are: no changes currently in transfer status at this time. Patient's medical and functional status update has been discussed with the Rehabilitation physician and patient remains appropriate for inpatient rehabilitation. Will admit to inpatient rehab today.  Preadmission Screen Completed By:  [name], 02/13/2019 8:15 AM ______________________________________________________________________   Discussed status with Dr. Naaman Plummer on 02/13/19 at 4:39PM and received telephone approval for admission today.  Admission Coordinator:  [name], time 4:39PM/Date 02/13/19

## 2019-02-13 NOTE — Progress Notes (Signed)
Inpatient Rehabilitation-Admissions Coordinator   [facility] has received insurance approval and medical approval for admit to CIR today. Pt, her family, and RN updated on plan.   Please call if questions.   [name], OTR/L  Rehab Admissions Coordinator  [phone] 02/13/2019 4:53 PM

## 2019-02-13 NOTE — Progress Notes (Addendum)
[facility] Progress Note   Subjective:  Seen in room. Abd pain improving, says her breathing is a little better today. Had CXR and VQ scan yesterday which were negative. Used all 4.25% fluid overnight again - PD UF report pending.  Objective Vitals:   02/13/19 0610 02/13/19 0818 02/13/19 0826 02/13/19 0830  BP: (!) 144/67 (!) 144/67 (!) 161/81 (!) 161/81  Pulse: 80 80 78 76  Resp: (!) 21 (!) 21 20 18   Temp: 98 F (36.7 C) 98 F (36.7 C) (!) 97.5 F (36.4 C) (!) 97.5 F (36.4 C)  TempSrc: Oral Oral Oral Oral  SpO2: 100%  99% 100%  Weight:  111.7 kg  103.7 kg  Height:  5\' 9"  (1.753 m)     Physical Exam General: Well appearing woman, NAD Heart: RRR; no murmur Lungs: CTA in upper lobes, dull at bases without overt rales Abdomen: soft, non-tender. PD cath without tenderness Extremities: No LE edema  Additional Objective Labs: Basic Metabolic Panel: Recent Labs  Lab 02/07/19 0625 02/10/19 0646 02/11/19 0854  NA 131* 133* 135  K 3.5 3.0* 3.1*  CL 97* 99 99  CO2 24 24 24   GLUCOSE 227* 176* 230*  BUN 61* 41* 39*  CREATININE 9.58* 7.66* 7.59*  CALCIUM 6.8* 7.0* 7.6*  PHOS  --  5.1*  --    Liver Function Tests: Recent Labs  Lab 02/07/19 0625  AST 31  ALT 31  ALKPHOS 106  BILITOT 0.6  PROT 6.2*  ALBUMIN 2.0*   CBC: Recent Labs  Lab 02/07/19 0625 02/08/19 0435 02/10/19 0646 02/11/19 0854 02/12/19 0621  WBC 6.3 8.8 8.4 10.0 9.3  NEUTROABS 4.4  --   --   --   --   HGB 7.4* 7.5* 7.4* 10.6* 10.4*  HCT 24.2* 24.1* 24.1* 34.6* 34.6*  MCV 95.3 96.0 95.3 93.5 94.8  PLT 266 301 331 317 326   Blood Culture    Component Value Date/Time   SDES PERITONEAL DIALYSATE 02/08/2019 1050   SDES PERITONEAL DIALYSATE 02/08/2019 1050   SPECREQUEST NONE 02/08/2019 1050   SPECREQUEST NONE 02/08/2019 1050   CULT  02/08/2019 1050    NO GROWTH 4 DAYS Performed at [name], [place] [address] [place], [place] [address]    REPTSTATUS PENDING 02/08/2019  1050   REPTSTATUS 02/08/2019 FINAL 02/08/2019 1050   Studies/Results: Dg Chest 2 View  Result Date: 02/12/2019 CLINICAL DATA:  Shortness of breath and right chest pain. Nausea and vomiting 3 days ago. EXAM: CHEST - 2 VIEW COMPARISON:  07/24/2017 and thoracic spine 12/18/2018 FINDINGS: Lungs are adequately inflated and otherwise clear. Cardiomediastinal silhouette is within normal. Stable mild compression fracture over the midthoracic spine. IMPRESSION: No active cardiopulmonary disease. Electronically Signed   By: Marin Olp M.D.   On: 02/12/2019 11:13   Nm Pulmonary Perf And Vent  Result Date: 02/12/2019 CLINICAL DATA:  Chest pain and shortness of breath EXAM: NUCLEAR MEDICINE VENTILATION - PERFUSION LUNG SCAN VIEWS: Anterior, posterior, left lateral, right lateral, RAO, LAO, RPO, LPO-ventilation and perfusion RADIOPHARMACEUTICALS:  32.4 mCi of Tc-76m DTPA aerosol inhalation and 4.3 mCi Tc61m MAA IV COMPARISON:  Chest radiograph February 12, 2019 FINDINGS: Ventilation: Radiotracer uptake is homogeneous and symmetric bilaterally. No ventilation defects are evident. Perfusion: Radiotracer uptake is homogeneous and symmetric bilaterally. No perfusion defects are evident. IMPRESSION: No appreciable ventilation or perfusion defects. Very low probability of pulmonary embolus. Electronically Signed   By: Lowella Grip III M.D.   On: 02/12/2019 14:26  Medications: . cefTAZidime (FORTAZ)  IV 1 g (02/12/19 1501)  . dialysis solution 4.25% low-MG/low-CA     . carvedilol  6.25 mg Oral BID WC  . darbepoetin (ARANESP) injection - NON-DIALYSIS  60 mcg Subcutaneous Q Wed-1800  . feeding supplement (PRO-STAT SUGAR FREE 64)  30 mL Oral BID  . gentamicin cream  1 application Topical Daily  . heparin  5,000 Units Subcutaneous Q8H  . insulin aspart  0-15 Units Subcutaneous TID WC  . insulin aspart  6 Units Subcutaneous Q lunch  . insulin aspart  8 Units Subcutaneous Q breakfast  . insulin aspart  8 Units  Subcutaneous Q supper  . insulin glargine  28 Units Subcutaneous Q lunch  . lidocaine-prilocaine  1 application Topical UD  . metroNIDAZOLE  1 Applicatorful Vaginal QHS  . polyethylene glycol  17 g Oral Daily  . senna-docusate  1 tablet Oral BID  . simvastatin  5 mg Oral QHS  . sodium chloride flush  3 mL Intravenous Q12H  . vancomycin variable dose per unstable renal function (pharmacist dosing)   Does not apply See admin instructions  . Vitamin D (Ergocalciferol)  50,000 Units Oral Q7 days    Dialysis Orders: [facility]CPD 7X/week with5 exchanges overnight,2.5L fill volume, dwell time 1.5 hrs, no day bag or pause, EDW 99kg edw. Typically uses combo of 2.5% and 4.25% fluid. - Mircera IM 50mg  (last given 11/28/18)  Assessment/Plan: 1. PD cath related peritonitis: 1st episode. Cell count -1st peritonitis episode, initial cell count 210 (81% neutrophils), PD fluid Cx 2/13 negative. On IV Fortaz/Vancomycin - will need 2 week course. Will have outpatient PD team review sterile techniques. She was increased to 2.5L dwells 1 mo ago,issues tolerating here with SOB. Changed down to 2.25L on 2/16 - still with same issue. Question fluid overload - using all 4.25% for past 2 nights and symptoms improving slightly. CXR without pulm edema, but with rales in RLL - will continue all 4.25% bags, ?consider CT if persists.  2. Debility: Transferring to CIR soon. 3. ESRD: Continue CCPD. See discussion above, likely overloaded - continue 4.25% for all exchanges.  4. HTN/volume: Increase UF as tolerated. 5. Anemiaof CKD: Hgb 10.4, s/p 2U PRBCs 2/15. Continue Aranesp weekly.  6.Metabolic Bone Disease: Corr Ca/Phos controlled, no binders/VDRA.  7. Nutrition: Alb low, continue pro-stat supplement. 8. Hx CVA 9. Uncontrolled T2DM: Hgb A1C 9.9%, per primary  10. Hx respiratory arrest: In setting of iatrogenic narcotic overdose, resolved with narcan.  Veneta Penton, PA-C 02/13/2019, 10:02 AM  [facility] Pager: [phone]  Pt seen, examined and agree w A/P as above.  [facility] 02/13/2019, 11:08 AM

## 2019-02-13 NOTE — Discharge Summary (Signed)
Physician Discharge Summary  Heather [name] ZOX:[id number] DOB: 04-17-1971 DOA: 02/05/2019  PCP: Briscoe Deutscher, DO  Admit date: 02/05/2019 Discharge date: 02/13/2019  Admitted From:  Home  Disposition: CIR  Recommendations for Outpatient Follow-up:  1. Follow up with PCP in 1-2 weeks 2. Please obtain BMP/CBC in one week 3. She needs 2 weeks IV vancomycin and cefepime 4. Please adjust insulin regimen.    Discharge Condition; stable.  CODE STATUS: full code Diet recommendation: Heart Healthy / Carb Modified   Brief/Interim Summary:  48 year old with past medical history significant for type 2 diabetes, end-stage renal disease on peritoneal dialysis since July 2019, chronic constipation, nonhemorrhagic cerebrovascular accident, proliferative diabetic retinopathy who presents complaining of worsening left lower abdominal pain.  The pain is worse when she tries to do peritoneal dialysis.  She was able to complete dialysis 4 days ago.  She also reports nausea vomiting.  She was a started on metronidazole for bacterial vaginosis.  Patient on February 11 became unresponsive after 2 mg of IV Dilaudid.  CODE BLUE was called.  Patient received CPR.  After Narcan patient regained consciousness.   1-Peritonitis likely related to PD catheter; CT abdomen on presentation did show decrease free air in the abdomen with only tiny focus remaining adjacent to the liver. Hemorrhagic cyst in the left ovary. Peritoneal fluid culture no growth today.  White blood cell 210 with 81% neutrophils. Peritoneal catheter now functioning. Continue with IV antibiotics.  Zofran PRN Abdominal Pain improved.  She needs to 2 weeks IV antibiotics per renal.  Stable to transfer  To CIR.   2-Loss of consciousness, respiratory arrest: After 2 mg of IV Dilaudid patient became unresponsive. CPR was a started.  After Narcan patient regained consciousness. Discontinue IV Dilaudid. Monitor on oral tramadol. ECHO  normal EF  Chest pain, right side, SOB;  Pain can be related to chest compression that she received but she is also complaining of SOB VQ scan negative . Chest x ray negative.  Suspect chest pain MS related to chest compression.  Dyspnea improved.   Anemia, of chronic diseases;  received 2 units PRBC.  Hb stable.   End-stage renal disease on peritoneal dialysis Nephrology managing.  Insulin-dependent diabetic: Continue with insulin. Hypoglycemia today. Will hold meals coverage and will reduce lantus  Hypertension; blood pressure stable.  Bacterial Vaginosis: order vaginal flagy./   Left ovarian cyst; pelvic ultrasound showed 2.8 cm mildly complex left ovarian cyst.  Patient will need ultrasound in 6 to 12 weeks preferably during the week following patient's normal menses.   Hypokalemia; will defer to nephrology  Constipation; lactulose. resolved.      Discharge Diagnoses:  Principal Problem:   Peritonitis, dialysis-associated ([place]) Active Problems:   Diabetes mellitus, type II, insulin dependent ([place]), on CGM, with end organ damage: renal, neuropathy, gastroparesis, retinopathy   Hypertension associated with diabetes ([place])   Gastroparesis due to DM ([place])   Secondary hyperparathyroidism ([place])   CKD (chronic kidney disease) stage 5, GFR less than 15 ml/min ([place])   Peritoneal dialysis catheter in place [facility])   Peritonitis, dialysis-associated, initial encounter [facility])   Left ovarian cyst    Discharge Instructions  Discharge Instructions    Diet - low sodium heart healthy   Complete by:  As directed    Increase activity slowly   Complete by:  As directed      Allergies as of 02/13/2019      Reactions   Ibuprofen Other (See Comments)   CKD stage 3.  Should avoid.   Dilaudid [hydromorphone Hcl] Itching, Other (See Comments)   Can take with Benadryl.   Tramadol Itching      Medication List    STOP taking these medications   baclofen 10 MG  tablet Commonly known as:  LIORESAL   insulin degludec 100 UNIT/ML Sopn FlexTouch Pen Commonly known as:  TRESIBA FLEXTOUCH   metroNIDAZOLE 500 MG tablet Commonly known as:  FLAGYL   NOVOLOG 100 UNIT/ML injection Generic drug:  insulin aspart   oxyCODONE 5 MG immediate release tablet Commonly known as:  ROXICODONE   promethazine 25 MG tablet Commonly known as:  PHENERGAN     TAKE these medications   carvedilol 6.25 MG tablet Commonly known as:  COREG Take 1 tablet (6.25 mg total) by mouth 2 (two) times daily with a meal.   gabapentin 300 MG capsule Commonly known as:  NEURONTIN Take 1 capsule (300 mg total) by mouth 2 (two) times daily as needed (pain).   insulin glargine 100 UNIT/ML injection Commonly known as:  LANTUS Inject 0.2 mLs (20 Units total) into the skin daily with lunch.   lidocaine-prilocaine cream Commonly known as:  EMLA Apply 1 application topically as directed. For Dialysis use   lubiprostone 24 MCG capsule Commonly known as:  AMITIZA Take 1 capsule (24 mcg total) by mouth 2 (two) times daily with a meal. What changed:  additional instructions   metroNIDAZOLE 0.75 % vaginal gel Commonly known as:  METROGEL Place 1 Applicatorful vaginally at bedtime.   simvastatin 10 MG tablet Commonly known as:  ZOCOR Take 0.5 tablets (5 mg total) by mouth at bedtime. 1/2 po q hs - GFR 14   Vitamin D (Ergocalciferol) 1.25 MG (50000 UT) Caps capsule Commonly known as:  DRISDOL Take 1 capsule (50,000 Units total) by mouth every 7 (seven) days. Start taking on:  February 20, 2019      Follow-up Information    Briscoe Deutscher, DO Follow up.   Specialty:  Family Medicine Contact information: [place] [address] [phone]        f/u with gyn Follow up.   Why:  left ovarian cyst, need to repeat pelvic US in 6-12 weeks, preferably during the week following period         Allergies  Allergen Reactions  . Ibuprofen Other (See  Comments)    CKD stage 3. Should avoid.  . Dilaudid [Hydromorphone Hcl] Itching and Other (See Comments)    Can take with Benadryl.  . Tramadol Itching    Consultations: Nephrology   Procedures/Studies: Ct Abdomen Pelvis Wo Contrast  Result Date: 02/05/2019 CLINICAL DATA:  Left lower quadrant pain for 5 days. Peritoneal dialysis patient. EXAM: CT ABDOMEN AND PELVIS WITHOUT CONTRAST TECHNIQUE: Multidetector CT imaging of the abdomen and pelvis was performed following the standard protocol without IV contrast. COMPARISON:  February 03, 2019 FINDINGS: Lower chest: There is a tiny left pleural effusion. Bibasilar atelectasis. No other abnormalities in the lower chest. Hepatobiliary: There is a focus of air in the right upper quadrant, seen on series 3, image 13 and coronal image 50, thought to be adjacent to rather than within the liver. The amount of air adjacent to the liver has decreased in the interval. No other definitive free air in the abdomen or pelvis identified. The liver and gallbladder are unremarkable on this unenhanced study. Pancreas: Unremarkable. No pancreatic ductal dilatation or surrounding inflammatory changes. Spleen: Normal in size without focal abnormality. Adrenals/Urinary Tract: Adrenal glands are  normal. There is a tiny stone in the upper pole of the left kidney. No other renal stones identified. No hydronephrosis or perinephric stranding. No ureterectasis or ureteral stones. The bladder is unremarkable. Stomach/Bowel: The stomach and small bowel are normal. The colon is unremarkable. No evidence of colitis. The appendix is unremarkable. Vascular/Lymphatic: Atherosclerotic changes seen in the nonaneurysmal aorta. No adenopathy. Reproductive: Uterus is normal. The left ovary is larger than the right. There is high attenuation in the inferior left ovary on the previous study, probably a hemorrhagic cyst. This is more difficult to visualize today but still remains. Other: The free air  seen previously in the abdomen has decreased with only a single focus seen adjacent to the liver. A small amount of perihepatic fluid may be from previous ascites. The high attenuation in the anterior abdominal fat remains, nonspecific. Adjacent loops of bowel are normal. High attenuation in the subcutaneous fat likely represents mild volume overload. No other acute abnormalities. Musculoskeletal: No acute or significant osseous findings. IMPRESSION: 1. Decreasing free air in the abdomen with only a tiny focus remaining adjacent to the liver, likely due to previous dialysis. The small amount of fluid in the abdomen is likely also due to previous dialysis. 2. Suspected hemorrhagic cyst in the left ovary. A pelvic ultrasound could better evaluate. 3. Tiny left pleural effusion. 4. Tiny nonobstructive stone in the left kidney. 5. Atherosclerotic changes in the nonaneurysmal aorta. Electronically Signed   By: Dorise Bullion III M.D   On: 02/05/2019 12:29   Ct Abdomen Pelvis Wo Contrast  Result Date: 02/03/2019 CLINICAL DATA:  Abdominal and LEFT pelvic pain for 3 days with nausea and vomiting question diverticulitis; history of pancreatitis, GERD, at type II diabetes mellitus, hypertension, stroke, end-stage renal disease on dialysis EXAM: CT ABDOMEN AND PELVIS WITHOUT CONTRAST TECHNIQUE: Multidetector CT imaging of the abdomen and pelvis was performed following the standard protocol without IV contrast. Sagittal and coronal MPR images reconstructed from axial data set. Patient drank dilute oral contrast for exam. COMPARISON:  04/14/2017 FINDINGS: Lower chest: Lung bases clear Hepatobiliary: Contracted gallbladder. No focal hepatic abnormalities. Pancreas: Normal appearance Spleen: Normal appearance Adrenals/Urinary Tract: Adrenal glands normal appearance. Kidneys small with tiny nonobstructing calculi in LEFT kidney. No renal mass, hydronephrosis or ureteral dilatation. Bladder unremarkable. Stomach/Bowel: Normal  appendix. Stomach unremarkable. Questionable wall thickening of the transverse colon versus artifact from underdistention. No definite small bowel abnormalities are identified. An area of infiltration of intra-abdominal fat in anterior LEFT mid abdomen is identified, nonspecific, approximately 4.5 x 1.5 x 3.7 cm in size, without definite wall thickening or abnormalities of the closest small bowel loops, of uncertain significance. Vascular/Lymphatic: Atherosclerotic calcifications aorta and iliac arteries without aneurysm. Small pericardial effusion is present. No adenopathy. Scattered pelvic phleboliths. Reproductive: Unremarkable uterus and ovaries. Other: Peritoneal dialysis catheter in pelvis. Minimal scattered peritoneal fluid. Tiny foci of intraperitoneal free air are identified, nonspecific, may be related to peritoneal dialysis. No definite hernia or additional inflammatory changes. Musculoskeletal: No acute osseous findings. IMPRESSION: Focus of nonspecific infiltration in the intraperitoneal fat of the anterior LEFT mid abdomen, area approximately 4.5 x 1.5 x 3.7 cm, could potentially be related to infiltration from peritoneal dialysate, focal nonspecific inflammatory process not excluded. Questionable mild wall thickening of the transverse colon versus artifact from underdistention, cannot exclude colitis. Small pericardial effusion. Foci of nonspecific free air, which could be related to peritoneal dialysis. Electronically Signed   By: Lavonia Dana M.D.   On: 02/03/2019 22:19  Dg Chest 2 View  Result Date: 02/12/2019 CLINICAL DATA:  Shortness of breath and right chest pain. Nausea and vomiting 3 days ago. EXAM: CHEST - 2 VIEW COMPARISON:  07/24/2017 and thoracic spine 12/18/2018 FINDINGS: Lungs are adequately inflated and otherwise clear. Cardiomediastinal silhouette is within normal. Stable mild compression fracture over the midthoracic spine. IMPRESSION: No active cardiopulmonary disease.  Electronically Signed   By: Marin Olp M.D.   On: 02/12/2019 11:13   Nm Pulmonary Perf And Vent  Result Date: 02/12/2019 CLINICAL DATA:  Chest pain and shortness of breath EXAM: NUCLEAR MEDICINE VENTILATION - PERFUSION LUNG SCAN VIEWS: Anterior, posterior, left lateral, right lateral, RAO, LAO, RPO, LPO-ventilation and perfusion RADIOPHARMACEUTICALS:  32.4 mCi of Tc-64m DTPA aerosol inhalation and 4.3 mCi Tc33m MAA IV COMPARISON:  Chest radiograph February 12, 2019 FINDINGS: Ventilation: Radiotracer uptake is homogeneous and symmetric bilaterally. No ventilation defects are evident. Perfusion: Radiotracer uptake is homogeneous and symmetric bilaterally. No perfusion defects are evident. IMPRESSION: No appreciable ventilation or perfusion defects. Very low probability of pulmonary embolus. Electronically Signed   By: Lowella Grip III M.D.   On: 02/12/2019 14:26   US Pelvic Complete W Transvaginal And Torsion R/o  Result Date: 02/05/2019 CLINICAL DATA:  Left ovarian hemorrhagic cyst on CT scan. EXAM: TRANSABDOMINAL AND TRANSVAGINAL ULTRASOUND OF PELVIS DOPPLER ULTRASOUND OF OVARIES TECHNIQUE: Both transabdominal and transvaginal ultrasound examinations of the pelvis were performed. Transabdominal technique was performed for global imaging of the pelvis including uterus, ovaries, adnexal regions, and pelvic cul-de-sac. It was necessary to proceed with endovaginal exam following the transabdominal exam to visualize the ovaries. Color and duplex Doppler ultrasound was utilized to evaluate blood flow to the ovaries. COMPARISON:  CT scan of same day. FINDINGS: Uterus Measurements: 11.7 x 6.5 x 6.3 cm = volume: 252 mL. Two small fibroids are noted, with the largest measuring 1.5 cm posteriorly. Endometrium Thickness: 10 mm which is within normal limits for patient of reproductive age. No focal abnormality visualized. Right ovary Measurements: 3.1 x 2.0 x 1.3 cm = volume: 4 mL. Normal appearance/no adnexal  mass. Left ovary Measurements: 4.9 x 3.4 x 3.2 cm = volume: 27 mL. Multiple cysts are noted, with the largest measuring 2.8 cm which is mildly complex and concerning for hemorrhagic cyst. Pulsed Doppler evaluation of both ovaries demonstrates normal low-resistance arterial and venous waveforms. Other findings No abnormal free fluid. IMPRESSION: 2.8 cm mildly complex left ovarian cyst is noted concerning for hemorrhagic cyst. Short-interval follow up ultrasound in 6-12 weeks is recommended, preferably during the week following the patient's normal menses. Small uterine fibroids are noted as well. Electronically Signed   By: Marijo Conception, M.D.   On: 02/05/2019 15:35      Subjective: Dyspnea improved.  Abdominal pain improved.   Discharge Exam: Vitals:   02/13/19 0826 02/13/19 0830  BP: (!) 161/81 (!) 161/81  Pulse: 78 76  Resp: 20 18  Temp: (!) 97.5 F (36.4 C) (!) 97.5 F (36.4 C)  SpO2: 99% 100%     General: Pt is alert, awake, not in acute distress Cardiovascular: RRR, S1/S2 +, no rubs, no gallops Respiratory: CTA bilaterally, no wheezing, no rhonchi Abdominal: Soft, NT, ND, bowel sounds + Extremities: no edema, no cyanosis    The results of significant diagnostics from this hospitalization (including imaging, microbiology, ancillary and laboratory) are listed below for reference.     Microbiology: Recent Results (from the past 240 hour(s))  Blood culture (routine x 2)  Status: None   Collection Time: 02/03/19  8:15 PM  Result Value Ref Range Status   Specimen Description   Final    BLOOD BLOOD LEFT FOREARM Performed at [facility], [address] [place], [place] [address]    Special Requests   Final    BOTTLES DRAWN AEROBIC AND ANAEROBIC Blood Culture adequate volume Performed at [facility], [address] [place], [place] [address]    Culture   Final    NO GROWTH 5 DAYS Performed at [facility], [place] [address]  [place], [place] [address]    Report Status 02/08/2019 FINAL  Final  Wet prep, genital     Status: Abnormal   Collection Time: 02/03/19  8:45 PM  Result Value Ref Range Status   Yeast Wet Prep HPF POC NONE SEEN NONE SEEN Final   Trich, Wet Prep NONE SEEN NONE SEEN Final   Clue Cells Wet Prep HPF POC PRESENT (A) NONE SEEN Final   WBC, Wet Prep HPF POC NONE SEEN NONE SEEN Final   Sperm NONE SEEN  Final    Comment: Performed at [facility] [address] [place], [place] [address]  Body fluid culture     Status: None   Collection Time: 02/03/19  9:10 PM  Result Value Ref Range Status   Specimen Description   Final    PERITONEAL DIALYSATE Performed at [facility], [address] [place], [place] [address]    Special Requests   Final    NONE Performed at [facility], [address] [place], [place] [address]    Gram Stain   Final    FEW WBC PRESENT, PREDOMINANTLY PMN NO ORGANISMS SEEN    Culture   Final    NO GROWTH 3 DAYS Performed at [facility], [place] [address] [place], [place] [address]    Report Status 02/07/2019 FINAL  Final  Culture, body fluid-bottle     Status: None   Collection Time: 02/08/19 10:50 AM  Result Value Ref Range Status   Specimen Description PERITONEAL DIALYSATE  Final   Special Requests NONE  Final   Culture   Final    NO GROWTH 5 DAYS Performed at [place] [address] [place], [place] [address]    Report Status 02/13/2019 FINAL  Final  Gram stain     Status: None   Collection Time: 02/08/19 10:50 AM  Result Value Ref Range Status   Specimen Description PERITONEAL DIALYSATE  Final   Special Requests NONE  Final   Gram Stain   Final    WBC PRESENT,BOTH PMN AND MONONUCLEAR NO ORGANISMS SEEN CYTOSPIN SMEAR Performed at [facility], [address], [place], [place] [address]    Report Status 02/08/2019 FINAL  Final     Labs: BNP (last 3 results) No results for input(s): BNP in the  last 8760 hours. Basic Metabolic Panel: Recent Labs  Lab 02/07/19 0625 02/10/19 0646 02/11/19 0854 02/13/19 1038  NA 131* 133* 135 139  K 3.5 3.0* 3.1* 3.2*  CL 97* 99 99 100  CO2 24 24 24 23   GLUCOSE 227* 176* 230* 114*  BUN 61* 41* 39* 34*  CREATININE 9.58* 7.66* 7.59* 7.93*  CALCIUM 6.8* 7.0* 7.6* 8.3*  PHOS  --  5.1*  --  4.7*   Liver Function Tests: Recent Labs  Lab 02/07/19 0625 02/13/19 1038  AST 31  --   ALT 31  --   ALKPHOS 106  --   BILITOT 0.6  --   PROT 6.2*  --   ALBUMIN 2.0* 2.2*   No results for input(s): LIPASE, AMYLASE in the last 168 hours. No results for input(s): AMMONIA in the last 168 hours. CBC: Recent Labs  Lab 02/07/19 0625 02/08/19 0435 02/10/19 0646 02/11/19 0854 02/12/19 0621  WBC 6.3 8.8 8.4 10.0 9.3  NEUTROABS 4.4  --   --   --   --   HGB 7.4* 7.5* 7.4* 10.6* 10.4*  HCT 24.2* 24.1* 24.1* 34.6* 34.6*  MCV 95.3 96.0 95.3 93.5 94.8  PLT 266 301 331 317 326   Cardiac Enzymes: No results for input(s): CKTOTAL, CKMB, CKMBINDEX, TROPONINI in the last 168 hours. BNP: Invalid input(s): POCBNP CBG: Recent Labs  Lab 02/12/19 1709 02/12/19 2222 02/13/19 0716 02/13/19 1122 02/13/19 1238  GLUCAP 145* 177* 231* 55* 117*   D-Dimer No results for input(s): DDIMER in the last 72 hours. Hgb A1c No results for input(s): HGBA1C in the last 72 hours. Lipid Profile No results for input(s): CHOL, HDL, LDLCALC, TRIG, CHOLHDL, LDLDIRECT in the last 72 hours. Thyroid function studies No results for input(s): TSH, T4TOTAL, T3FREE, THYROIDAB in the last 72 hours.  Invalid input(s): FREET3 Anemia work up Recent Labs    02/11/19 1812  VITAMINB12 573   Urinalysis    Component Value Date/Time   COLORURINE STRAW (A) 07/11/2018 2004   APPEARANCEUR HAZY (A) 07/11/2018 2004   LABSPEC 1.008 07/11/2018 2004   PHURINE 7.0 07/11/2018 2004   GLUCOSEU 50 (A) 07/11/2018 2004   HGBUR NEGATIVE 07/11/2018 2004   BILIRUBINUR NEGATIVE 07/11/2018 2004    BILIRUBINUR Negative 07/29/2017 0802   BILIRUBINUR neg 08/08/2013   KETONESUR NEGATIVE 07/11/2018 2004   PROTEINUR >=300 (A) 07/11/2018 2004   UROBILINOGEN 0.2 07/29/2017 0802   UROBILINOGEN 0.2 09/07/2015 1230   NITRITE NEGATIVE 07/11/2018 2004   LEUKOCYTESUR NEGATIVE 07/11/2018 2004   Sepsis Labs Invalid input(s): PROCALCITONIN,  WBC,  LACTICIDVEN Microbiology Recent Results (from the past 240 hour(s))  Blood culture (routine x 2)     Status: None   Collection Time: 02/03/19  8:15 PM  Result Value Ref Range Status   Specimen Description   Final    BLOOD BLOOD LEFT FOREARM Performed at [facility], [address] [place], [place] [address]    Special Requests   Final    BOTTLES DRAWN AEROBIC AND ANAEROBIC Blood Culture adequate volume Performed at [facility], [address] [place], [place] [address]    Culture   Final    NO GROWTH 5 DAYS Performed at [facility], [place] [address], [place], [place] [address]    Report Status 02/08/2019 FINAL  Final  Wet prep, genital     Status: Abnormal   Collection Time: 02/03/19  8:45 PM  Result Value Ref Range Status   Yeast Wet Prep HPF POC NONE SEEN NONE SEEN Final   Trich, Wet Prep NONE SEEN NONE SEEN Final   Clue Cells Wet Prep HPF POC PRESENT (A) NONE SEEN Final   WBC, Wet Prep HPF POC NONE SEEN NONE SEEN Final   Sperm NONE SEEN  Final    Comment: Performed at [facility], [address] [place], [place] [address]  Body fluid culture     Status: None   Collection Time: 02/03/19  9:10 PM  Result Value Ref Range Status   Specimen Description   Final    PERITONEAL DIALYSATE Performed at [facility], [address] [place], [place] [address]    Special Requests   Final    NONE Performed at [facility], [address] [place], [place] [address]    Gram Stain   Final    FEW WBC PRESENT, PREDOMINANTLY PMN NO ORGANISMS SEEN    Culture   Final    NO GROWTH  3 DAYS Performed at [facility], [place] [address], [place], [place] [address]    Report Status 02/07/2019 FINAL  Final  Culture, body fluid-bottle     Status: None   Collection Time: 02/08/19 10:50 AM  Result Value Ref Range Status   Specimen Description PERITONEAL DIALYSATE  Final   Special Requests NONE  Final   Culture   Final    NO GROWTH 5 DAYS Performed at [place] [address] [place], [place] [address]    Report Status 02/13/2019 FINAL  Final  Gram stain     Status: None   Collection Time: 02/08/19 10:50 AM  Result Value Ref Range Status   Specimen Description PERITONEAL DIALYSATE  Final   Special Requests NONE  Final   Gram Stain   Final    WBC PRESENT,BOTH PMN AND MONONUCLEAR NO ORGANISMS SEEN CYTOSPIN SMEAR Performed at [facility], [address], [place],  [address]    Report Status 02/08/2019 FINAL  Final     Time coordinating discharge: 40 minutes  SIGNED:   Elmarie Shiley, MD  [facility]

## 2019-02-14 ENCOUNTER — Inpatient Hospital Stay (HOSPITAL_COMMUNITY): Payer: Managed Care, Other (non HMO) | Admitting: Occupational Therapy

## 2019-02-14 ENCOUNTER — Inpatient Hospital Stay (HOSPITAL_COMMUNITY): Payer: Managed Care, Other (non HMO) | Admitting: Physical Therapy

## 2019-02-14 DIAGNOSIS — I150 Renovascular hypertension: Secondary | ICD-10-CM

## 2019-02-14 DIAGNOSIS — D638 Anemia in other chronic diseases classified elsewhere: Secondary | ICD-10-CM

## 2019-02-14 DIAGNOSIS — K659 Peritonitis, unspecified: Secondary | ICD-10-CM

## 2019-02-14 DIAGNOSIS — N186 End stage renal disease: Secondary | ICD-10-CM

## 2019-02-14 DIAGNOSIS — E669 Obesity, unspecified: Secondary | ICD-10-CM

## 2019-02-14 DIAGNOSIS — L299 Pruritus, unspecified: Secondary | ICD-10-CM

## 2019-02-14 DIAGNOSIS — R7309 Other abnormal glucose: Secondary | ICD-10-CM

## 2019-02-14 DIAGNOSIS — Z992 Dependence on renal dialysis: Secondary | ICD-10-CM

## 2019-02-14 DIAGNOSIS — M792 Neuralgia and neuritis, unspecified: Secondary | ICD-10-CM

## 2019-02-14 DIAGNOSIS — E1169 Type 2 diabetes mellitus with other specified complication: Secondary | ICD-10-CM

## 2019-02-14 LAB — GLUCOSE, CAPILLARY
GLUCOSE-CAPILLARY: 158 mg/dL — AB (ref 70–99)
Glucose-Capillary: 228 mg/dL — ABNORMAL HIGH (ref 70–99)
Glucose-Capillary: 190 mg/dL — ABNORMAL HIGH (ref 70–99)
Glucose-Capillary: 291 mg/dL — ABNORMAL HIGH (ref 70–99)

## 2019-02-14 MED ORDER — GABAPENTIN 100 MG PO CAPS
100.0000 mg | ORAL_CAPSULE | Freq: Every day | ORAL | Status: DC
  Administered 2019-02-15 – 2019-02-24 (×7): 100 mg via ORAL
  Filled 2019-02-14 (×9): qty 1

## 2019-02-14 MED ORDER — DIPHENHYDRAMINE-ZINC ACETATE 2-0.1 % EX CREA
TOPICAL_CREAM | Freq: Two times a day (BID) | CUTANEOUS | Status: DC | PRN
  Filled 2019-02-14: qty 28

## 2019-02-14 NOTE — Progress Notes (Signed)
[facility] Progress Note   Subjective:  Seen in room.  -3.8 L net last night, was 3.0 L the night before.  Wt's down to 101 kg and dry wt is 100kg.  Had 2.5 L dwell vol last night and still not tolerating.   Objective Vitals:   02/13/19 1930 02/14/19 0539 02/14/19 0937 02/14/19 1556  BP: 109/62 134/63 (!) 118/57 (!) 145/85  Pulse: 77 79 81 79  Resp: 18 18 18 18   Temp: 98 F (36.7 C) 98.2 F (36.8 C) 98 F (36.7 C) 97.6 F (36.4 C)  TempSrc: Oral Oral Oral Oral  SpO2: 97% 97%  100%  Weight:  101.8 kg    Height:       Physical Exam General: Well appearing woman, NAD Heart: RRR; no murmur Lungs: CTA in upper lobes, dull at bases without overt rales Abdomen: soft, non-tender. PD cath without tenderness Extremities: No LE edema  Additional Objective Labs: Basic Metabolic Panel: Recent Labs  Lab 02/10/19 0646 02/11/19 0854 02/13/19 1038  NA 133* 135 139  K 3.0* 3.1* 3.2*  CL 99 99 100  CO2 24 24 23   GLUCOSE 176* 230* 114*  BUN 41* 39* 34*  CREATININE 7.66* 7.59* 7.93*  CALCIUM 7.0* 7.6* 8.3*  PHOS 5.1*  --  4.7*   Liver Function Tests: Recent Labs  Lab 02/13/19 1038  ALBUMIN 2.2*   CBC: Recent Labs  Lab 02/08/19 0435 02/10/19 0646 02/11/19 0854 02/12/19 0621  WBC 8.8 8.4 10.0 9.3  HGB 7.5* 7.4* 10.6* 10.4*  HCT 24.1* 24.1* 34.6* 34.6*  MCV 96.0 95.3 93.5 94.8  PLT 301 331 317 326   Blood Culture    Component Value Date/Time   SDES PERITONEAL DIALYSATE 02/08/2019 1050   SDES PERITONEAL DIALYSATE 02/08/2019 1050   SPECREQUEST NONE 02/08/2019 1050   SPECREQUEST NONE 02/08/2019 1050   CULT  02/08/2019 1050    NO GROWTH 5 DAYS Performed at [facility], [place] [address], [place], [place] [address]    REPTSTATUS 02/13/2019 FINAL 02/08/2019 1050   REPTSTATUS 02/08/2019 FINAL 02/08/2019 1050   Studies/Results: No results found. Medications: . cefTAZidime (FORTAZ)  IV 1 g (02/14/19 1209)  . dialysis solution 4.25% low-MG/low-CA      . carvedilol  6.25 mg Oral BID WC  . darbepoetin (ARANESP) injection - NON-DIALYSIS  60 mcg Subcutaneous Q Wed-1800  . feeding supplement (PRO-STAT SUGAR FREE 64)  30 mL Oral BID  . [START ON 02/15/2019] gabapentin  100 mg Oral Daily  . gentamicin cream  1 application Topical Daily  . heparin  5,000 Units Subcutaneous Q8H  . insulin aspart  0-15 Units Subcutaneous TID WC  . insulin glargine  20 Units Subcutaneous Q lunch  . lidocaine-prilocaine  1 application Topical UD  . metroNIDAZOLE  1 Applicatorful Vaginal QHS  . polyethylene glycol  17 g Oral Daily  . senna-docusate  1 tablet Oral BID  . simvastatin  5 mg Oral QHS  . vancomycin variable dose per unstable renal function (pharmacist dosing)   Does not apply See admin instructions  . [START ON 02/20/2019] Vitamin D (Ergocalciferol)  50,000 Units Oral Q7 days    Dialysis Orders: [facility] 7X/week with5 exchanges overnight,2.5L fill volume, dwell time 1.5 hrs, no day bag or pause, EDW 99kg edw. Typically uses combo of 2.5% and 4.25% fluid. - Mircera IM 50mg  (last given 11/28/18)  Assessment/Plan: 1. PD cath related peritonitis: 1st episode. Cell count -1st peritonitis episode, initial cell count 210 (81%  neutrophils), PD fluid Cx 2/13 negative. On IV Fortaz/Vancomycin - will need 2 week course. Will have outpatient PD team review sterile techniques, may be breaking off to use bathroom w/o proper reconnecting, not sure.   2. Debility: getting rehab on CIR now 3. ESRD: Continue CCPD. Not tolerating 2.5 L dwells (recently raised), will resume 2 L dwells. Now is back down to dry wt will go back to usual home regimen, per pt, all 2.5%.  Resume 1.5 hour dwells for now as well.  4. HTN/volume: Increase UF as tolerated. 5. Anemiaof CKD: Hgb 10.4, s/p 2U PRBCs 2/15. Continue Aranesp weekly.  6.Metabolic Bone Disease: Corr Ca/Phos controlled, no binders/VDRA.  7. Nutrition: Alb low, continue pro-stat supplement. 8. Hx CVA 9. Uncontrolled  T2DM: Hgb A1C 9.9%, per primary  10. Hx respiratory arrest: In setting of iatrogenic narcotic overdose, resolved with narcan.   [facility] 02/14/2019, 4:07 PM

## 2019-02-14 NOTE — Care Management Note (Signed)
[facility] Individual Statement of Services  Patient Name:  Heather [name]  Date:  02/14/2019  Welcome to the [facility].  Our goal is to provide you with an individualized program based on your diagnosis and situation, designed to meet your specific needs.  With this comprehensive rehabilitation program, you will be expected to participate in at least 3 hours of rehabilitation therapies Monday-Friday, with modified therapy programming on the weekends.  Your rehabilitation program will include the following services:  Physical Therapy (PT), Occupational Therapy (OT), 24 hour per day rehabilitation nursing, Therapeutic Recreaction (TR), Neuropsychology, Case Management (Social Worker), Rehabilitation Medicine, Nutrition Services and Pharmacy Services  Weekly team conferences will be held on Wednesday to discuss your progress.  Your Social Worker will talk with you frequently to get your input and to update you on team discussions.  Team conferences with you and your family in attendance may also be held.  Expected length of stay: 7-10 days  Overall anticipated outcome: independent with device  Depending on your progress and recovery, your program may change. Your Social Worker will coordinate services and will keep you informed of any changes. Your Social Worker's name and contact numbers are listed  below.  The following services may also be recommended but are not provided by the [facility] will be made to provide these services after discharge if needed.  Arrangements include referral to agencies that provide these services.  Your insurance has been verified to be:  [place] Your primary doctor is:  Briscoe Deutscher  Pertinent information will be shared with your doctor and your  insurance company.  Social Worker:  Ovidio Kin, [place] or ([phone]  Information discussed with and copy given to patient by: Elease Hashimoto, 02/14/2019, 3:24 PM

## 2019-02-14 NOTE — Progress Notes (Signed)
[facility] PHYSICAL MEDICINE & REHABILITATION PROGRESS NOTE  Subjective/Complaints: Patient seen sitting up in bed this morning.  She states she slept well overnight due to burning in her feet which was resolved with medication.  She states she is ready begin therapies.  ROS: Denies CP, shortness of breath, nausea, vomiting, diarrhea.  Objective: Vital Signs: Blood pressure 134/63, pulse 79, temperature 98.2 F (36.8 C), temperature source Oral, resp. rate 18, height 5\' 9"  (1.753 m), weight 101.8 kg, SpO2 97 %. Dg Chest 2 View  Result Date: 02/12/2019 CLINICAL DATA:  Shortness of breath and right chest pain. Nausea and vomiting 3 days ago. EXAM: CHEST - 2 VIEW COMPARISON:  07/24/2017 and thoracic spine 12/18/2018 FINDINGS: Lungs are adequately inflated and otherwise clear. Cardiomediastinal silhouette is within normal. Stable mild compression fracture over the midthoracic spine. IMPRESSION: No active cardiopulmonary disease. Electronically Signed   By: Marin Olp M.D.   On: 02/12/2019 11:13   Nm Pulmonary Perf And Vent  Result Date: 02/12/2019 CLINICAL DATA:  Chest pain and shortness of breath EXAM: NUCLEAR MEDICINE VENTILATION - PERFUSION LUNG SCAN VIEWS: Anterior, posterior, left lateral, right lateral, RAO, LAO, RPO, LPO-ventilation and perfusion RADIOPHARMACEUTICALS:  32.4 mCi of Tc-30m DTPA aerosol inhalation and 4.3 mCi Tc27m MAA IV COMPARISON:  Chest radiograph February 12, 2019 FINDINGS: Ventilation: Radiotracer uptake is homogeneous and symmetric bilaterally. No ventilation defects are evident. Perfusion: Radiotracer uptake is homogeneous and symmetric bilaterally. No perfusion defects are evident. IMPRESSION: No appreciable ventilation or perfusion defects. Very low probability of pulmonary embolus. Electronically Signed   By: Lowella Grip III M.D.   On: 02/12/2019 14:26   Recent Labs    02/11/19 0854 02/12/19 0621  WBC 10.0 9.3  HGB 10.6* 10.4*  HCT 34.6* 34.6*  PLT 317  326   Recent Labs    02/11/19 0854 02/13/19 1038  NA 135 139  K 3.1* 3.2*  CL 99 100  CO2 24 23  GLUCOSE 230* 114*  BUN 39* 34*  CREATININE 7.59* 7.93*  CALCIUM 7.6* 8.3*    Physical Exam: BP 134/63 (BP Location: Right Arm)   Pulse 79   Temp 98.2 F (36.8 C) (Oral)   Resp 18   Ht 5\' 9"  (1.753 m)   Wt 101.8 kg   SpO2 97%   BMI 33.14 kg/m  Constitutional: No distress . Vital signs reviewed.  Obese. HENT: Normocephalic.  Atraumatic. Eyes: EOMI. No discharge. Cardiovascular: RRR. No JVD.  + Murmur. Respiratory: CTA Bilaterally. Normal effort. GI: BS +. Non-distended.  + PD cath Musc: No edema or tenderness in extremities. Neurological: Alert. Motor: Grossly 4-1/5 throughout  Skin: She isnot diaphoretic.  Psychiatric: She has anormal mood and affect. Herbehavior is normal.Judgmentand thought contentnormal.   Assessment/Plan: 1. Functional deficits secondary to debility which require 3+ hours per day of interdisciplinary therapy in a comprehensive inpatient rehab setting.  Physiatrist is providing close team supervision and 24 hour management of active medical problems listed below.  Physiatrist and rehab team continue to assess barriers to discharge/monitor patient progress toward functional and medical goals  Care Tool:  Bathing              Bathing assist       Upper Body Dressing/Undressing Upper body dressing        Upper body assist      Lower Body Dressing/Undressing Lower body dressing            Lower body assist       Toileting  Toileting    Toileting assist       Transfers Chair/bed transfer  Transfers assist     Chair/bed transfer assist level: Minimal Assistance - Patient > 75%     Locomotion Ambulation   Ambulation assist              Walk 10 feet activity   Assist           Walk 50 feet activity   Assist           Walk 150 feet activity   Assist           Walk 10 feet on  uneven surface  activity   Assist           Wheelchair     Assist               Wheelchair 50 feet with 2 turns activity    Assist            Wheelchair 150 feet activity     Assist            Medical Problem List and Plan: 1.Functional declinesecondary todebilityafter PD relarted peritonitis/sepsis, pt with history of right cerebellar infarct with residual right limb/truncal ataxia and balance deficits  Begin CIR  Notes reviewed- bacterial peritonitis with history of stroke, complicated by respiratory arrest felt to be due to narcotic overdose, labs reviewed 2. DVT Prophylaxis/Anticoagulation: Pharmaceutical:Heparin 3.Chronic back pain/Pain Management:No baclofen or muscle relaxers per Nephrology. ContinueTramadol as needed 4. Mood:LCSW to follow for evaluation and support 5. Neuropsych: This patientiscapable of making decisions on herown behalf. 6. Skin/Wound Care:Routine pressure relief measures. 7. Fluids/Electrolytes/Nutrition:Monitor I's and O's.  Filed Weights   02/13/19 1750 02/14/19 0539  Weight: 103.5 kg 101.8 kg  8. ESRD:Continue PD in the evenings. Nephrology to follow for PDassistance/management and correction of electrolyte abnormalities.[name]  9.PD associated peritonitis: Vancomycin/Fortazinitiated 2/08 at [facility]--to continuefor 14 total days.   10.T2DM: Decreased 2/18 due to hypoglycemia.Continue Lantus(on Tresiba at [employer] with meal coverage as well as sliding scale insulin for tighter blood sugar control. Monitor blood sugars AC at bedtime.   Monitor with increased mobility 11.Chronic constipation: Encourage compliance with bowel program.  12. Anemia of chronic disease:On Arnaesp weekly. Continue to monitor with serial checks.   Hemoglobin 10.4 on 2/17  Continue to monitor 13. Neuropathy: Gabapentin changed to 100 mg daily on 2/19 14. Bacterial vaginosis: On flagyl D# 3/5 15. HTN: Monitor BP  bid. On coreg bid.  Monitor with increased mobility 16. Persistent Hypokalemia: Recs per nephro 17.  Pruritus  Benadryl cream ordered on 2/19   LOS: 1 days A FACE TO FACE EVALUATION WAS PERFORMED  Zacheriah Stumpe Lorie Phenix 02/14/2019, 8:47 AM

## 2019-02-14 NOTE — Progress Notes (Signed)
Social Work  Social Work Assessment and Plan  Patient Details  Name: Heather [name] MRN: [id number] Date of Birth: [birth year]  Today's Date: 02/14/2019  Problem List:  Patient Active Problem List   Diagnosis Date Noted  . Pruritus   . Neuropathic pain   . Renovascular hypertension   . Anemia of chronic disease   . Diabetes mellitus type 2 in obese ([place])   . Labile blood glucose   . ESRD on dialysis ([place])   . Bacterial peritonitis ([place])   . Debility 02/13/2019  . Peritonitis, dialysis-associated, initial encounter ([place]) 02/05/2019  . Peritonitis, dialysis-associated ([place]) 02/05/2019  . Left ovarian cyst 02/05/2019  . Hemiplegia and hemiparesis following cerebral infarction affecting left non-dominant side ([place]) 12/29/2018  . Peritoneal dialysis catheter in place [facility]) 11/14/2018  . Pre-transplant evaluation for kidney transplant 11/07/2018  . ESRD with anemia ([place])   . ESRD (end stage renal disease) on dialysis ([place])   . Grade I diastolic dysfunction 47/42/5956  . Arteriovenous fistula ([place]) 07/21/2018  . Type 2 diabetes mellitus with end-stage renal disease ([place]) 07/21/2018  . History of cerebrovascular accident (CVA) involving cerebellum 07/21/2018  . Cyst of left ovary 01/12/2018  . Dysmenorrhea 01/12/2018  . Insomnia 01/12/2018  . Menorrhagia 01/12/2018  . Vitamin D deficiency 01/02/2018  . Bilateral lower extremity edema 01/02/2018  . Reactive depression 12/10/2017  . Arthralgia of right temporomandibular joint 10/24/2017  . Antiplatelet or antithrombotic long-term use: Plavix 06/30/2017  . Morbid obesity due to excess calories ([place]) 11/16/2016  . Upper airway cough syndrome 11/15/2016  . DM (diabetes mellitus), type 2, uncontrolled, with renal complications ([place]) 38/75/6433  . Dyslipidemia associated with type 2 diabetes mellitus ([place]) 06/25/2016  . Uncontrolled type 2 diabetes mellitus with both eyes affected by proliferative retinopathy and macular edema,  with long-term current use of insulin ([place]) 06/22/2016  . Uncontrolled type 2 diabetes mellitus with diabetic polyneuropathy, with long-term current use of insulin ([place]) 03/08/2016  . Proliferative diabetic retinopathy ([place]) 09/29/2015  . Constipation 09/03/2015  . Pseudophakia of both eyes 05/30/2015  . 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 B12 deficiency 05/10/2014  . Anemia associated with chronic renal failure, Procrit 04/26/2014  . CKD (chronic kidney disease) stage 5, GFR less than 15 ml/min (H[facility]) 04/26/2014  . Secondary hyperparathyroidism ([place]) 11/27/2013  . Posterior subcapsular cataract, bilateral 10/18/2013  . Diabetic neuropathy, painful ([place]), on low dose Gabapentin 07/13/2013  . Diabetes mellitus, type II, insulin dependent ([place]), on CGM, with end organ damage: renal, neuropathy, gastroparesis, retinopathy 02/19/2011  . Hypertension associated with diabetes ([place]) 02/19/2011  . Gastroparesis due to DM ([place]) 02/19/2011   Past Medical History:  Past Medical History:  Diagnosis Date  . Antiplatelet or antithrombotic long-term use: Plavix 06/30/2017  . B12 deficiency 05/10/2014  . Blood transfusion without reported diagnosis   . Chronic constipation   . CVA (cerebral vascular accident) ([place]), nonhemorrhagic, inferior right cerebellum 12/03/2017  . Cyst of left ovary 01/12/2018  . Diabetic neuropathy, painful ([place]), on low dose Gabapentin 07/13/2013  . DM (diabetes mellitus), type 2, uncontrolled, with renal complications ([place]) 29/51/8841  . DM2 (diabetes mellitus, type 2) ([place])   . Dysfunction of left eustachian tube, with pusatile tinnitus 11/24/2017  . Dyslipidemia associated with type 2 diabetes mellitus ([place]) 06/25/2016  . ESRD (end stage renal disease) on dialysis [facility])    On hemodialysis in July 2019 via [facility] then switched to [facility] in Nov 2019.   [name] ESRD with anemia ([place])   .  Fibromyalgia   . Gastroparesis due to DM   . GERD (gastroesophageal reflux disease)   .  Hypertension associated with diabetes ([place]) 02/19/2011  . IBS (irritable bowel syndrome)   . Left-sided weakness 12/10/2017   .  [name] Leiomyoma of uterus   . Pancreatitis   . Proliferative diabetic retinopathy ([place]) 09/29/2015  . 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 Reactive depression 12/10/2017   .  [name] Right-sided low back pain without sciatica 12/08/2015  . Secondary hyperthyroidism 11/27/2013  . Steal syndrome dialysis vascular access ([place]) 02/17/2018  . Thromboembolism ([place]) 04/05/2018  . Upper airway cough syndrome, with recs to stay off ACE and take Pepcid q hs 11/15/2016   Past Surgical History:  Past Surgical History:  Procedure Laterality Date  . ARTERY REPAIR Right 02/17/2018   Procedure: EXPLORATION OF RIGHT BRACHIAL ARTERY;  Surgeon: Conrad Wildwood, MD;  Location: [facility];  Service: Vascular;  Laterality: Right;  . ARTERY REPAIR Right 02/17/2018   Procedure: BRACHIAL ARTERY EXPLORATION AND TRHOMBECTOMY;  Surgeon: Conrad [place], MD;  Location[facility] OR;  Service: Vascular;  Laterality: Right;  . AV FISTULA PLACEMENT Left 05/09/2017   Procedure: INSERTION OF ARTERIOVENOUS (AV) GRAFT ARM (ARTEGRAFT);  Surgeon: Conrad Jennings, MD;  Location: [facility] OR;  Service: Vascular;  Laterality: Left;  . AV FISTULA PLACEMENT Right 02/17/2018   Procedure: INSERTION OF ARTERIOVENOUS (AV) GORE-TEX GRAFT ARM RIGHT UPPER ARM;  Surgeon: Conrad Delmar, MD;  Location: [facility];  Service: Vascular;  Laterality: Right;  . BASCILIC VEIN TRANSPOSITION Left 08/31/2016   Procedure: BASILIC VEIN TRANSPOSITION FIRST STAGE;  Surgeon: Conrad [place], MD;  Location: [facility];  Service: Vascular;  Laterality: Left;  . CENTRAL VENOUS CATHETER INSERTION Left 07/24/2018   Procedure: INSERTION CENTRAL LINE ADULT;  Surgeon: Conrad [place], MD;  Location: [facility];  Service: Vascular;  Laterality: Left;  . EYE SURGERY     secondary to diabetic retinopathy   . FOOT SURGERY Right    t"ook bone out- maybe hammer toe"  . INSERTION OF  DIALYSIS CATHETER Left 07/24/2018   Procedure: INSERTION OF TUNNELED DIALYSIS CATHETER;  Surgeon: Conrad Dickey, MD;  Location: [facility];  Service: Vascular;  Laterality: Left;  . IR FLUORO GUIDE CV LINE RIGHT  07/21/2018  . IR US GUIDE VASC ACCESS RIGHT  07/21/2018  . LIGATION OF ARTERIOVENOUS  FISTULA Left 05/09/2017   Procedure: LIGATION OF ARTERIOVENOUS  FISTULA;  Surgeon: Conrad [place], MD;  Location: [facility];  Service: Vascular;  Laterality: Left;  . LOOP RECORDER INSERTION N/A 12/05/2017   Procedure: LOOP RECORDER INSERTION;  Surgeon: Evans Lance, MD;  Location: [facility] CV LAB;  Service: Cardiovascular;  Laterality: N/A;  . MYOMECTOMY    . REMOVAL OF GRAFT Right 02/17/2018   Procedure: REMOVAL OF RIGHT UPPER ARM ARTERIOVENOUS GRAFT;  Surgeon: Conrad [place], MD;  Location: [facility];  Service: Vascular;  Laterality: Right;  . REVISION OF ARTERIOVENOUS GORETEX GRAFT Left 07/24/2018   Procedure: REDO ARTERIOVENOUS GORETEX GRAFT;  Surgeon: Conrad Butler, MD;  Location: [facility];  Service: Vascular;  Laterality: Left;  . TEE WITHOUT CARDIOVERSION N/A 12/05/2017   Procedure: TRANSESOPHAGEAL ECHOCARDIOGRAM (TEE);  Surgeon: Sanda Klein, MD;  Location: [facility] ENDOSCOPY;  Service: Cardiovascular;  Laterality: N/A;  . UPPER EXTREMITY VENOGRAPHY Left 07/13/2018   Procedure: UPPER EXTREMITY VENOGRAPHY - Central & Left Arm;  Surgeon: Conrad , MD;  Location: [facility] LAB;  Service: Cardiovascular;  Laterality:  Left;  . UTERINE FIBROID SURGERY    . VITRECTOMY Bilateral    Social History:  reports that she has never smoked. She has never used smokeless tobacco. She reports that she does not drink alcohol or use drugs.  Family / Support Systems Marital Status: Married Patient Roles: Spouse, Parent, Other (Comment)(employee) Spouse/Significant Other: Charles [phone] Children: son who is grown Other Supports: Mom and siblings Anticipated Caregiver: Husband works days and family  members Ability/Limitations of Caregiver: husband in the evenings and mom or sister during the day Caregiver Availability: Other (Comment)(Working on a plan) Family Dynamics: Close knit family pt is one of nine children so she has many siblings and extended family. Her son is involved and her friends and co-workers are supportive.  Social History Preferred language: English Religion: Holiness Cultural Background: No issues Education: Some college Read: Yes Write: Yes Employment Status: Employed Name of Employer: [employer] Return to Work Plans: Plans to return when healed Public relations account executive Issues: No issues Guardian/Conservator: None-according to MD pt is capable of making her own decisions while here   Abuse/Neglect Abuse/Neglect Assessment Can Be Completed: Yes Physical Abuse: Denies Verbal Abuse: Denies Sexual Abuse: Denies Exploitation of patient/patient's resources: Denies Self-Neglect: Denies  Emotional Status Pt's affect, behavior and adjustment status: Pt is motivated to do well and regain her independence she is not one to ask for help from others. She was managing before this using and walker or furniture walking and getting herself on and off her PD> Recent Psychosocial Issues: other health issues which limit her at times-fibromyalgia Psychiatric History: No history deferred depression screen due to coping appropriately and verbalizing her concerns and issues. This worker does feel she would benefit from seeing neuro-psych while here due to [facility] medical issues and for coping.  Substance Abuse History: No issues  Patient / Family Perceptions, Expectations & Goals Pt/Family understanding of illness & functional limitations: Pt and sister can explain her medical issues and have spoken with the MD regarding her questions and treatment plan going forward. She likes to be very involved in her treatment plan. Premorbid pt/family roles/activities: Wife, Mom,  employee, sibling, friend, etc Anticipated changes in roles/activities/participation: resume Pt/family expectations/goals: Pt states: " I will take care of myself like I always have, nothing will get me down."  Sister states: " You don't know how stubborn she is and will do it."  [employer]: Other (Comment)(PD since 2019) Premorbid Home Care/DME Agencies: Other (Comment)(has cane and rollator) Transportation available at discharge: HUsband and family she was driving prior to admission Resource referrals recommended: Neuropsychology, Support group (specify)  Discharge Planning Living Arrangements: Spouse/significant other Support Systems: Spouse/significant other, Children, Parent, Other relatives, Friends/neighbors, Church/faith community Type of Residence: Private residence Insurance Resources: Multimedia programmer (specify)([employer]) Museum/gallery curator Resources: Employment, Secondary school teacher Screen Referred: No Living Expenses: Lives with family Money Management: Spouse, Patient Does the patient have any problems obtaining your medications?: No Home Management: She and husband Patient/Family Preliminary Plans: Return home with husband who works during the day and family members will come in and asssit if needed. [name] waiting to see her evaluations and will work on a safe discharge plan for herself. [name] glad to be here and is ready to go to therapies and get better. Social Work Anticipated Follow Up Needs: HH/OP, Support Group  Clinical Impression Pleasant motivated female who is willing to work hard to achieve her goals while here. She never had rehab after her stroke and is glad  to be here. She has multiple family members who are involved and willing to assist at discharge. Will work on a safe discharge plan and await therapy evaluations.  Elease Hashimoto 02/14/2019, 3:22 PM

## 2019-02-14 NOTE — Evaluation (Signed)
Physical Therapy Assessment and Plan  Patient Details  Name: Heather [name] MRN: [id number] Date of Birth: [birth year]  PT Diagnosis: Abnormality of gait, Difficulty walking, Impaired sensation and Muscle weakness Rehab Potential: Good ELOS: 7-10 days   Today's Date: 02/14/2019 PT Individual Time: 0350-0938; 1829-9371 PT Individual Time Calculation (min): 15 min  And 45 min  Problem List:  Patient Active Problem List   Diagnosis Date Noted  . Pruritus   . Neuropathic pain   . Renovascular hypertension   . Anemia of chronic disease   . Diabetes mellitus type 2 in obese ([place])   . Labile blood glucose   . ESRD on dialysis ([place])   . Bacterial peritonitis ([place])   . Debility 02/13/2019  . Peritonitis, dialysis-associated, initial encounter ([place]) 02/05/2019  . Peritonitis, dialysis-associated ([place]) 02/05/2019  . Left ovarian cyst 02/05/2019  . Hemiplegia and hemiparesis following cerebral infarction affecting left non-dominant side ([place]) 12/29/2018  . Peritoneal dialysis catheter in place [facility]) 11/14/2018  . Pre-transplant evaluation for kidney transplant 11/07/2018  . ESRD with anemia ([place])   . ESRD (end stage renal disease) on dialysis ([place])   . Grade I diastolic dysfunction 69/67/8938  . Arteriovenous fistula ([place]) 07/21/2018  . Type 2 diabetes mellitus with end-stage renal disease ([place]) 07/21/2018  . History of cerebrovascular accident (CVA) involving cerebellum 07/21/2018  . Cyst of left ovary 01/12/2018  . Dysmenorrhea 01/12/2018  . Insomnia 01/12/2018  . Menorrhagia 01/12/2018  . Vitamin D deficiency 01/02/2018  . Bilateral lower extremity edema 01/02/2018  . Reactive depression 12/10/2017  . Arthralgia of right temporomandibular joint 10/24/2017  . Antiplatelet or antithrombotic long-term use: Plavix 06/30/2017  . Morbid obesity due to excess calories ([place]) 11/16/2016  . Upper airway cough syndrome 11/15/2016  . DM (diabetes mellitus), type 2, uncontrolled, with  renal complications ([place]) 10/12/5101  . Dyslipidemia associated with type 2 diabetes mellitus ([place]) 06/25/2016  . Uncontrolled type 2 diabetes mellitus with both eyes affected by proliferative retinopathy and macular edema, with long-term current use of insulin ([place]) 06/22/2016  . Uncontrolled type 2 diabetes mellitus with diabetic polyneuropathy, with long-term current use of insulin ([place]) 03/08/2016  . Proliferative diabetic retinopathy ([place]) 09/29/2015  . Constipation 09/03/2015  . Pseudophakia of both eyes 05/30/2015  . 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 B12 deficiency 05/10/2014  . Anemia associated with chronic renal failure, Procrit 04/26/2014  . CKD (chronic kidney disease) stage 5, GFR less than 15 ml/min (HCC) 04/26/2014  . Secondary hyperparathyroidism ([place]) 11/27/2013  . Posterior subcapsular cataract, bilateral 10/18/2013  . Diabetic neuropathy, painful ([place]), on low dose Gabapentin 07/13/2013  . Diabetes mellitus, type II, insulin dependent ([place]), on CGM, with end organ damage: renal, neuropathy, gastroparesis, retinopathy 02/19/2011  . Hypertension associated with diabetes ([place]) 02/19/2011  . Gastroparesis due to DM ([place]) 02/19/2011    Past Medical History:  Past Medical History:  Diagnosis Date  . Antiplatelet or antithrombotic long-term use: Plavix 06/30/2017  . B12 deficiency 05/10/2014  . Blood transfusion without reported diagnosis   . Chronic constipation   . CVA (cerebral vascular accident) ([place]), nonhemorrhagic, inferior right cerebellum 12/03/2017  . Cyst of left ovary 01/12/2018  . Diabetic neuropathy, painful ([place]), on low dose Gabapentin 07/13/2013  . DM (diabetes mellitus), type 2, uncontrolled, with renal complications ([place]) 58/52/7782  . DM2 (diabetes mellitus, type 2) ([place])   . Dysfunction of left eustachian tube, with pusatile tinnitus 11/24/2017  . Dyslipidemia associated with type 2 diabetes mellitus ([place]) 06/25/2016  .  ESRD (end stage renal  disease) on dialysis [facility])    On hemodialysis in July 2019 via [facility] then switched to [facility] in Nov 2019.   [name] ESRD with anemia ([place])   . Fibromyalgia   . Gastroparesis due to DM   . GERD (gastroesophageal reflux disease)   . Hypertension associated with diabetes ([place]) 02/19/2011  . IBS (irritable bowel syndrome)   . Left-sided weakness 12/10/2017   .  [name] Leiomyoma of uterus   . Pancreatitis   . Proliferative diabetic retinopathy ([place]) 09/29/2015  . 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 Reactive depression 12/10/2017   .  [name] Right-sided low back pain without sciatica 12/08/2015  . Secondary hyperthyroidism 11/27/2013  . Steal syndrome dialysis vascular access ([place]) 02/17/2018  . Thromboembolism ([place]) 04/05/2018  . Upper airway cough syndrome, with recs to stay off ACE and take Pepcid q hs 11/15/2016   Past Surgical History:  Past Surgical History:  Procedure Laterality Date  . ARTERY REPAIR Right 02/17/2018   Procedure: EXPLORATION OF RIGHT BRACHIAL ARTERY;  Surgeon: Conrad Florissant, MD;  Location: [place];  Service: Vascular;  Laterality: Right;  . ARTERY REPAIR Right 02/17/2018   Procedure: BRACHIAL ARTERY EXPLORATION AND TRHOMBECTOMY;  Surgeon: Conrad C[place] Grove, MD;  Location: [facility] OR;  Service: Vascular;  Laterality: Right;  . AV FISTULA PLACEMENT Left 05/09/2017   Procedure: INSERTION OF ARTERIOVENOUS (AV) GRAFT ARM (ARTEGRAFT);  Surgeon: Conrad Ellington, MD;  Location: [facility] OR;  Service: Vascular;  Laterality: Left;  . AV FISTULA PLACEMENT Right 02/17/2018   Procedure: INSERTION OF ARTERIOVENOUS (AV) GORE-TEX GRAFT ARM RIGHT UPPER ARM;  Surgeon: Conrad Amber, MD;  Location: [place];  Service: Vascular;  Laterality: Right;  . BASCILIC VEIN TRANSPOSITION Left 08/31/2016   Procedure: BASILIC VEIN TRANSPOSITION FIRST STAGE;  Surgeon: Conrad Ridgewood, MD;  Location: [place];  Service: Vascular;  Laterality: Left;  . CENTRAL VENOUS CATHETER INSERTION Left 07/24/2018   Procedure: INSERTION CENTRAL LINE ADULT;   Surgeon: Conrad Lake Latonka, MD;  Location: [place];  Service: Vascular;  Laterality: Left;  . EYE SURGERY     secondary to diabetic retinopathy   . FOOT SURGERY Right    t"ook bone out- maybe hammer toe"  . INSERTION OF DIALYSIS CATHETER Left 07/24/2018   Procedure: INSERTION OF TUNNELED DIALYSIS CATHETER;  Surgeon: Conrad Bishopville, MD;  Location: [place];  Service: Vascular;  Laterality: Left;  . IR FLUORO GUIDE CV LINE RIGHT  07/21/2018  . IR US GUIDE VASC ACCESS RIGHT  07/21/2018  . LIGATION OF ARTERIOVENOUS  FISTULA Left 05/09/2017   Procedure: LIGATION OF ARTERIOVENOUS  FISTULA;  Surgeon: Conrad Exeter, MD;  Location: [place];  Service: Vascular;  Laterality: Left;  . LOOP RECORDER INSERTION N/A 12/05/2017   Procedure: LOOP RECORDER INSERTION;  Surgeon: Evans Lance, MD;  Location: [place] CV LAB;  Service: Cardiovascular;  Laterality: N/A;  . MYOMECTOMY    . REMOVAL OF GRAFT Right 02/17/2018   Procedure: REMOVAL OF RIGHT UPPER ARM ARTERIOVENOUS GRAFT;  Surgeon: Conrad Rosalia, MD;  Location: [place];  Service: Vascular;  Laterality: Right;  . REVISION OF ARTERIOVENOUS GORETEX GRAFT Left 07/24/2018   Procedure: REDO ARTERIOVENOUS GORETEX GRAFT;  Surgeon: Conrad , MD;  Location: [place];  Service: Vascular;  Laterality: Left;  . TEE WITHOUT CARDIOVERSION N/A 12/05/2017   Procedure: TRANSESOPHAGEAL ECHOCARDIOGRAM (TEE);  Surgeon: Sanda Klein, MD;  Location: [place];  Service: Cardiovascular;  Laterality: N/A;  .  UPPER EXTREMITY VENOGRAPHY Left 07/13/2018   Procedure: UPPER EXTREMITY VENOGRAPHY - Central & Left Arm;  Surgeon: Conrad [place] Creek, MD;  Location: [place] CV LAB;  Service: Cardiovascular;  Laterality: Left;  . UTERINE FIBROID SURGERY    . VITRECTOMY Bilateral     Assessment & Plan Clinical Impression:  YIR:SWNIOE K Scott-Halley is a 48 year old female withhistory of T2DM that retinopathy, neuropathy and gastroparesis, B12 deficiency,chronic back pain/fibromyalgia,Right  cerebellarCVA12/2018, ESRD- on PD since 10/2018; who was admitted on 02/05/2019 with complaints of lower abdominal pain worse with PD as well as nausea and vomiting. She was found to have bacterial peritonitis and started on IV antibiotics as well as IV pain meds. Wet prep positive for bacterial vaginosis and she was started on Flagyl but this was DC'd due to ongoing issues with nausea. Pelvic ultrasound revealed 2.8 cm left ovarian cyst--needs follow-up ultrasound in 6 to 12 weeks. On 2/11,she arrested requiring CODE BLUE and CPR to resuscitate past dose of IV Dilaudid. She responded to Narcan and EKG without acute findings. Abdominal pain improving and nausea and vomiting has resolved. PD fluid now grossly clear and cultures negative so far.   Anemia of chronic disease to improve with 2 units PRBC.She reported increase in abdominal pain with SOB on 2/17 therefore V/Q scan done and showed very low probability for PE. CXR done and was negative for pulmonary edema but patient with rales RLL and question of SOB due to fluid overload.May needCT abdomen if pain continues to be ongoing.Constipation has resolved.Therapy ongoing and patient with significant balance deficits as well as orthostatic changes due to debility. CIR recommended for follow-up therapy. Patient transferred to CIR on 02/13/2019 .   Patient currently requires min with mobility secondary to muscle weakness, decreased cardiorespiratoy endurance and decreased standing balance, decreased postural control and decreased balance strategies.  Prior to hospitalization, patient was independent  with mobility and lived with Spouse in a House home.  Home access is 2Stairs to enter.  Patient will benefit from skilled PT intervention to maximize safe functional mobility, minimize fall risk and decrease caregiver burden for planned discharge home with intermittent assist.  Anticipate patient will benefit from follow up [facility] at discharge.  PT -  End of Session Activity Tolerance: Tolerates 30+ min activity with multiple rests Endurance Deficit: Yes Endurance Deficit Description: fatigues quickly with activity PT Assessment Rehab Potential (ACUTE/IP ONLY): Good PT Barriers to Discharge: Decreased caregiver support;Medical stability;Hemodialysis PT Patient demonstrates impairments in the following area(s): Balance;Endurance;Motor;Pain;Safety PT Transfers Functional Problem(s): Bed Mobility;Bed to Chair;Car;Furniture;Floor PT Locomotion Functional Problem(s): Ambulation;Wheelchair Mobility;Stairs PT Plan PT Intensity: Minimum of 1-2 x/day ,45 to 90 minutes PT Frequency: 5 out of 7 days PT Duration Estimated Length of Stay: 7-10 days PT Treatment/Interventions: Ambulation/gait training;Balance/vestibular training;Community reintegration;Discharge planning;Disease management/prevention;DME/adaptive equipment instruction;Functional mobility training;Neuromuscular re-education;Pain management;Patient/family education;Psychosocial support;Stair training;Therapeutic Activities;Therapeutic Exercise;UE/LE Strength taining/ROM;UE/LE Coordination activities;Splinting/orthotics PT Transfers Anticipated Outcome(s): Mod I PT Locomotion Anticipated Outcome(s): Mod I with LRAD PT Recommendation Recommendations for Other Services: Neuropsych consult;Therapeutic Recreation consult Therapeutic Recreation Interventions: Stress management Follow Up Recommendations: Home health PT Patient destination: Home Equipment Recommended: Rolling walker with 5" wheels Equipment Details: TBD pending progress  Skilled Therapeutic Intervention Evaluation completed (see details above and below) with education on PT POC and goals and individual treatment initiated with focus on functional transfer assessment and orientation to rehab unit, schedule, and ELOS. Pt performs bed mobility with Supervision. Sit to stand with mod A with no AD. Stand pivot transfer bed to  w/c  with mod A, very unsteady on her feet. Gait 2 x 25' with RW and min A for balance, decreased gait speed and BLE step length. Pt exhibits some excessive eversion of L ankle with gait. Pt fatigues quickly and needs frequent rest breaks during session. Pt left seated in bed with needs in reach, bed alarm in place.  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 Vision/Perception  Vision - History Baseline Vision: No Midwife: Within Financial controller Praxis: Intact  Cognition Overall Cognitive Status: Within Functional Limits for tasks assessed Arousal/Alertness: Awake/alert Orientation Level: Oriented X4 Attention: Sustained Sustained Attention: Appears intact Memory: Appears intact Awareness: Appears intact Problem Solving: Appears intact Safety/Judgment: Appears intact Sensation Sensation Light Touch: Impaired Detail Light Touch Impaired Details: Impaired RLE;Impaired LLE(impaired 2/2 peripheral neuropathy; distal > proximal) Proprioception: Impaired Detail Proprioception Impaired Details: Impaired RLE;Impaired LLE(impaired distal > proximal) Coordination Gross Motor Movements are Fluid and Coordinated: No Fine Motor Movements are Fluid and Coordinated: No Coordination and Movement Description: limited by generalized weakness and  neuropathy Heel Shin Test: impaired B due to decreased strength Motor  Motor Motor: Hemiplegia Motor - Skilled Clinical Observations: 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  Mobility Bed Mobility Bed Mobility: Rolling Right;Rolling Left;Supine to Sit;Sit to Supine Rolling Right: Supervision/verbal cueing Rolling Left: Supervision/Verbal cueing Supine to Sit: Supervision/Verbal cueing Sit to Supine: Supervision/Verbal cueing Transfers Transfers: Sit to Stand;Stand to Sit;Stand Pivot Transfers Sit to Stand: Moderate Assistance - Patient 50-74% Stand to Sit: Moderate Assistance - Patient 50-74% Stand Pivot Transfers: Moderate Assistance - Patient 50 - 74% Stand Pivot Transfer Details: Verbal cues for technique;Verbal cues for precautions/safety;Manual facilitation for weight shifting;Manual facilitation for placement Transfer (Assistive device): None Locomotion  Gait Gait Distance (Feet): 25 Feet Assistive device: Rolling walker Gait Gait Pattern: Impaired(dec B step length) Ankle - Stance Phase - Impaired Gait Pattern: Excessive eversion - Left Gait velocity: decreased Stairs / Additional Locomotion Stairs: No Wheelchair Mobility Wheelchair Mobility: No  Trunk/Postural Assessment  Cervical Assessment Cervical Assessment: Exceptions to WFL(forward head) Thoracic Assessment Thoracic Assessment: Exceptions to WFL(rounded shoulders) Lumbar Assessment Lumbar Assessment: Within Functional Limits Postural Control Postural Control: Deficits on evaluation(decreased upon initially standing)  Balance Balance Balance Assessed: Yes Static Sitting Balance Static Sitting - Balance Support: No upper extremity supported;Feet supported Static Sitting - Level of Assistance: 5: Stand by assistance Dynamic Sitting Balance Dynamic Sitting - Balance Support: No upper extremity supported;Feet supported;During functional activity Dynamic Sitting - Level of Assistance: 5: Stand by assistance Static  Standing Balance Static Standing - Balance Support: No upper extremity supported;During functional activity Static Standing - Level of Assistance: 4: Min assist Dynamic Standing Balance Dynamic Standing - Balance Support: Bilateral upper extremity supported;During functional activity Dynamic Standing - Level of Assistance: 4: Min assist Extremity Assessment   RLE Assessment RLE Assessment: Exceptions to [facility] Passive Range of Motion (PROM) Comments: WFL General Strength Comments: impaired, see below RLE Strength Right Hip Flexion: 4-/5 Right Knee Flexion: 4-/5 Right Knee Extension: 4-/5 Right Ankle Dorsiflexion: 2+/5  LLE Assessment LLE Assessment: Exceptions to [facility] Passive Range of Motion (PROM) Comments: [facility] General Strength Comments: impaired, see below LLE Strength Left Hip Flexion: 4/5 Left Knee Flexion: 4/5 Left Knee Extension: 4/5 Left Ankle Dorsiflexion: 2+/5    Refer to Care Plan for Long Term Goals  Recommendations for other services: Neuropsych and Therapeutic Recreation  Stress management  Discharge Criteria: Patient will be discharged from PT if patient refuses treatment 3 consecutive times without medical reason, if treatment goals not met, if there is a change in medical status, if patient makes no progress towards goals or if patient is discharged from hospital.  The above assessment, treatment plan, treatment alternatives and goals were discussed and mutually agreed upon: by patient   Excell Seltzer, PT, DPT 02/14/2019, 1:00 PM

## 2019-02-14 NOTE — Progress Notes (Signed)
Physical Therapy Session Note  Patient Details  Name: Heather [name] MRN: [id number] Date of Birth: 02/21/1971  Today's Date: 02/14/2019 PT Individual Time: 1500-1540 PT Individual Time Calculation (min): 40 min   Short Term Goals: Week 1:  PT Short Term Goal 1 (Week 1): =LTG due to ELOS  Skilled Therapeutic Interventions/Progress Updates:  Pt received sitting in WC and agreeable to PT. Pt transported to rehab gym in [facility]. Stair management training with min-mod assist x 3 steps with BUE support. Car transfer training with RW and min assist from PT for safety and LE management into car. Min cues for sit>pivot technique. WC mobility training with Supervision-min assist from PT for turning management and use of momentum to reduce energy expenditure. PT instructed pt in seated BLE therex. LAQ with level 2 tband, HS curls with level 2 tband and hip abduction with level 2 tband; each completed x 10 with cues for full ROM and eccentric control. Patient returned to room and left sitting in [facility] with call bell in reach and all needs met.       Therapy Documentation Precautions:  Precautions Precautions: Fall Restrictions Weight Bearing Restrictions: No   Pain: denies  Therapy/Group: Individual Therapy  Lorie Phenix 02/14/2019, 3:49 PM

## 2019-02-14 NOTE — Evaluation (Signed)
Occupational Therapy Assessment and Plan  Patient Details  Name: Heather [name] MRN: [id number] Date of Birth: 28-Jan-1971  OT Diagnosis: muscle weakness (generalized) Rehab Potential: Rehab Potential (ACUTE ONLY): Good ELOS: 7-10 days   Today's Date: 02/14/2019 OT Individual Time: 1230-1400 OT Individual Time Calculation (min): 90 min     Problem List:  Patient Active Problem List   Diagnosis Date Noted  . Pruritus   . Neuropathic pain   . Renovascular hypertension   . Anemia of chronic disease   . Diabetes mellitus type 2 in obese ([place])   . Labile blood glucose   . ESRD on dialysis ([place])   . Bacterial peritonitis ([place])   . Debility 02/13/2019  . Peritonitis, dialysis-associated, initial encounter ([place]) 02/05/2019  . Peritonitis, dialysis-associated ([place]) 02/05/2019  . Left ovarian cyst 02/05/2019  . Hemiplegia and hemiparesis following cerebral infarction affecting left non-dominant side ([place]) 12/29/2018  . Peritoneal dialysis catheter in place [facility]) 11/14/2018  . Pre-transplant evaluation for kidney transplant 11/07/2018  . ESRD with anemia ([place])   . ESRD (end stage renal disease) on dialysis ([place])   . Grade I diastolic dysfunction 17/51/0258  . Arteriovenous fistula ([place]) 07/21/2018  . Type 2 diabetes mellitus with end-stage renal disease ([place]) 07/21/2018  . History of cerebrovascular accident (CVA) involving cerebellum 07/21/2018  . Cyst of left ovary 01/12/2018  . Dysmenorrhea 01/12/2018  . Insomnia 01/12/2018  . Menorrhagia 01/12/2018  . Vitamin D deficiency 01/02/2018  . Bilateral lower extremity edema 01/02/2018  . Reactive depression 12/10/2017  . Arthralgia of right temporomandibular joint 10/24/2017  . Antiplatelet or antithrombotic long-term use: Plavix 06/30/2017  . Morbid obesity due to excess calories ([place]) 11/16/2016  . Upper airway cough syndrome 11/15/2016  . DM (diabetes mellitus), type 2, uncontrolled, with renal complications ([place]) 52/77/8242   . Dyslipidemia associated with type 2 diabetes mellitus ([place]) 06/25/2016  . Uncontrolled type 2 diabetes mellitus with both eyes affected by proliferative retinopathy and macular edema, with long-term current use of insulin ([place]) 06/22/2016  . Uncontrolled type 2 diabetes mellitus with diabetic polyneuropathy, with long-term current use of insulin ([place]) 03/08/2016  . Proliferative diabetic retinopathy ([place]) 09/29/2015  . Constipation 09/03/2015  . Pseudophakia of both eyes 05/30/2015  . 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 B12 deficiency 05/10/2014  . Anemia associated with chronic renal failure, Procrit 04/26/2014  . CKD (chronic kidney disease) stage 5, GFR less than 15 ml/min (H[facility]) 04/26/2014  . Secondary hyperparathyroidism ([place]) 11/27/2013  . Posterior subcapsular cataract, bilateral 10/18/2013  . Diabetic neuropathy, painful ([place]), on low dose Gabapentin 07/13/2013  . Diabetes mellitus, type II, insulin dependent ([place]), on CGM, with end organ damage: renal, neuropathy, gastroparesis, retinopathy 02/19/2011  . Hypertension associated with diabetes ([place]) 02/19/2011  . Gastroparesis due to DM ([place]) 02/19/2011    Past Medical History:  Past Medical History:  Diagnosis Date  . Antiplatelet or antithrombotic long-term use: Plavix 06/30/2017  . B12 deficiency 05/10/2014  . Blood transfusion without reported diagnosis   . Chronic constipation   . CVA (cerebral vascular accident) ([place]), nonhemorrhagic, inferior right cerebellum 12/03/2017  . Cyst of left ovary 01/12/2018  . Diabetic neuropathy, painful ([place]), on low dose Gabapentin 07/13/2013  . DM (diabetes mellitus), type 2, uncontrolled, with renal complications ([place]) 35/36/1443  . DM2 (diabetes mellitus, type 2) ([place])   . Dysfunction of left eustachian tube, with pusatile tinnitus 11/24/2017  . Dyslipidemia associated with type 2 diabetes mellitus ([place]) 06/25/2016  . ESRD (end stage renal  disease) on dialysis [facility])    On  hemodialysis in July 2019 via [facility] then switched to [facility] in Nov 2019.   [name] ESRD with anemia ([place])   . Fibromyalgia   . Gastroparesis due to DM   . GERD (gastroesophageal reflux disease)   . Hypertension associated with diabetes ([place]) 02/19/2011  . IBS (irritable bowel syndrome)   . Left-sided weakness 12/10/2017   .  [name] Leiomyoma of uterus   . Pancreatitis   . Proliferative diabetic retinopathy ([place]) 09/29/2015  . 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 Reactive depression 12/10/2017   .  [name] Right-sided low back pain without sciatica 12/08/2015  . Secondary hyperthyroidism 11/27/2013  . Steal syndrome dialysis vascular access ([place]) 02/17/2018  . Thromboembolism ([place]) 04/05/2018  . Upper airway cough syndrome, with recs to stay off ACE and take Pepcid q hs 11/15/2016   Past Surgical History:  Past Surgical History:  Procedure Laterality Date  . ARTERY REPAIR Right 02/17/2018   Procedure: EXPLORATION OF RIGHT BRACHIAL ARTERY;  Surgeon: Conrad Bloomingdale, MD;  Location: [place];  Service: Vascular;  Laterality: Right;  . ARTERY REPAIR Right 02/17/2018   Procedure: BRACHIAL ARTERY EXPLORATION AND TRHOMBECTOMY;  Surgeon: Conrad Powhatan, MD;  Location: [facility] OR;  Service: Vascular;  Laterality: Right;  . AV FISTULA PLACEMENT Left 05/09/2017   Procedure: INSERTION OF ARTERIOVENOUS (AV) GRAFT ARM (ARTEGRAFT);  Surgeon: Conrad Menifee, MD;  Location: [facility] OR;  Service: Vascular;  Laterality: Left;  . AV FISTULA PLACEMENT Right 02/17/2018   Procedure: INSERTION OF ARTERIOVENOUS (AV) GORE-TEX GRAFT ARM RIGHT UPPER ARM;  Surgeon: Conrad Fruitdale, MD;  Location: [place];  Service: Vascular;  Laterality: Right;  . BASCILIC VEIN TRANSPOSITION Left 08/31/2016   Procedure: BASILIC VEIN TRANSPOSITION FIRST STAGE;  Surgeon: Conrad Dawson, MD;  Location: [place];  Service: Vascular;  Laterality: Left;  . CENTRAL VENOUS CATHETER INSERTION Left 07/24/2018   Procedure: INSERTION CENTRAL LINE ADULT;  Surgeon: Conrad Sandoval, MD;   Location: [place];  Service: Vascular;  Laterality: Left;  . EYE SURGERY     secondary to diabetic retinopathy   . FOOT SURGERY Right    t"ook bone out- maybe hammer toe"  . INSERTION OF DIALYSIS CATHETER Left 07/24/2018   Procedure: INSERTION OF TUNNELED DIALYSIS CATHETER;  Surgeon: Conrad Hubbard Lake, MD;  Location: [place];  Service: Vascular;  Laterality: Left;  . IR FLUORO GUIDE CV LINE RIGHT  07/21/2018  . IR US GUIDE VASC ACCESS RIGHT  07/21/2018  . LIGATION OF ARTERIOVENOUS  FISTULA Left 05/09/2017   Procedure: LIGATION OF ARTERIOVENOUS  FISTULA;  Surgeon: Conrad Bryce, MD;  Location: [place];  Service: Vascular;  Laterality: Left;  . LOOP RECORDER INSERTION N/A 12/05/2017   Procedure: LOOP RECORDER INSERTION;  Surgeon: Evans Lance, MD;  Location: [facility] [facility];  Service: Cardiovascular;  Laterality: N/A;  . MYOMECTOMY    . REMOVAL OF GRAFT Right 02/17/2018   Procedure: REMOVAL OF RIGHT UPPER ARM ARTERIOVENOUS GRAFT;  Surgeon: Conrad Lakeport, MD;  Location: [place];  Service: Vascular;  Laterality: Right;  . REVISION OF ARTERIOVENOUS GORETEX GRAFT Left 07/24/2018   Procedure: REDO ARTERIOVENOUS GORETEX GRAFT;  Surgeon: Conrad , MD;  Location: [place];  Service: Vascular;  Laterality: Left;  . TEE WITHOUT CARDIOVERSION N/A 12/05/2017   Procedure: TRANSESOPHAGEAL ECHOCARDIOGRAM (TEE);  Surgeon: Sanda Klein, MD;  Location: [place];  Service: Cardiovascular;  Laterality: N/A;  . UPPER EXTREMITY  VENOGRAPHY Left 07/13/2018   Procedure: UPPER EXTREMITY VENOGRAPHY - Central & Left Arm;  Surgeon: Conrad Thorndale, MD;  Location: [place] [facility];  Service: Cardiovascular;  Laterality: Left;  . UTERINE FIBROID SURGERY    . VITRECTOMY Bilateral     Assessment & Plan Clinical Impression: STACI DACK is a 48 year old female withhistory of T2DM that retinopathy, neuropathy and gastroparesis, B12 deficiency,chronic back pain/fibromyalgia,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 She was found to have bacterial peritonitis and started on IV antibiotics as well as IV pain meds. Wet prep positive for bacterial vaginosis and she was started on Flagyl but this was DC'd due to ongoing issues with nausea. Pelvic ultrasound revealed 2.8 cm left ovarian cyst--needs follow-up ultrasound in 6 to 12 weeks. On 2/11,she arrested requiring CODE BLUE and CPR to resuscitate past dose of IV Dilaudid. She responded to Narcan and EKG without acute findings. Abdominal pain improving and nausea and vomiting has resolved. PD fluid now grossly clear and cultures negative so far.   Anemia of chronic disease to improve with 2 units PRBC.She reported increase in abdominal pain with SOB on 2/17 therefore V/Q scan done and showed very low probability for PE. CXR done and was negative for pulmonary edema but patient with rales RLL and question of SOB due to fluid overload.May needCT abdomen if pain continues to be ongoing.Constipation has resolved.Therapy ongoing and patient with significant balance deficits as well as orthostatic changes due to debility. CIR recommended for follow-up therapy Patient transferred to CIR on 02/13/2019 .    Patient currently requires min with basic self-care skills secondary to muscle weakness, decreased cardiorespiratoy endurance and decreased standing balance, decreased postural control and decreased balance strategies.  Prior to hospitalization, patient could complete ADLs with modified independent .  Patient will benefit from skilled intervention to decrease level of assist with basic self-care skills and increase independence with basic self-care skills prior to discharge home with care partner.  Anticipate patient will require 24 hour supervision and follow up home health.  OT - End of Session Activity Tolerance: Tolerates 10 - 20  min activity with multiple rests Endurance Deficit: Yes Endurance Deficit Description: fatigues quickly with activity, requiring frequent rest breaks throughout seated ADL routine OT Assessment Rehab Potential (ACUTE ONLY): Good OT Patient demonstrates impairments in the following area(s): Balance;Endurance;Safety;Sensory;Motor OT Basic ADL's Functional Problem(s): Grooming;Bathing;Dressing;Toileting OT Transfers Functional Problem(s): Toilet OT Additional Impairment(s): Fuctional Use of Upper Extremity OT Plan OT Intensity: Minimum of 1-2 x/day, 45 to 90 minutes OT Frequency: 5 out of 7 days OT Duration/Estimated Length of Stay: 7-10 days OT Treatment/Interventions: Balance/vestibular training;Community reintegration;Discharge planning;DME/adaptive equipment instruction;Patient/family education;Self Care/advanced ADL retraining;Neuromuscular re-education;Pain management;Psychosocial support;Therapeutic Activities;UE/LE Strength taining/ROM;Therapeutic Exercise;UE/LE Coordination activities OT Self Feeding Anticipated Outcome(s): Indep OT Basic Self-Care Anticipated Outcome(s): Mod I OT Toileting Anticipated Outcome(s): Mod I OT Bathroom Transfers Anticipated Outcome(s): Mod I OT Recommendation Recommendations for Other Services: Neuropsych consult Patient destination: Home Follow Up Recommendations: Home health OT Equipment Recommended: To be determined Equipment Details: Pt has BSC   Skilled Therapeutic Intervention Pt seen for OT eval and ADL bathing/dressing session. Pt sitting EOB upon arrival, denying pain and agreeable to tx session.  Pt required mod A inititally for sit>stand with RW, progressing to min A throughout session. She ambulated within room using RW with min-mod A for balance. Completed toileting task with  mod A for clothing management and max A for transfer onto/off standard toilet.  She completed bathing/dressing routine from w/c level at sink, overall steadying  assist for standing to complete pericare/buttock hygiene and LB clothing management.  With encouragement, pt willing to attempt to stand to complete oral hygiene, completed with steadying assist.  Throughout ADL routine, pt requires increased time and rest breaks 2/2 decreased functional activity tolerance.  Pt taken for tour of unit in w/c total A. Completed 5 minutes on SCIFIT UE cycle, level 2 for general strengthening and endurance.  Pt returned to room at end of session. With encouragement willing to stay sitting up in w/c. Pt left seated with all needs in reach. Education provided throughout session regarding role of OT, POC, OT/PT goals, energy conservation, DME and d/c planning.   OT Evaluation Precautions/Restrictions  Precautions Precautions: Fall Restrictions Weight Bearing Restrictions: No General Chart Reviewed: Yes Additional Pertinent History: Hx R hemiparesis and on PD Pain Pain Assessment Pain Scale: 0-10 Pain Score: 0-No pain Home Living/Prior Functioning Home Living Family/patient expects to be discharged to:: Private residence Living Arrangements: Spouse/significant other Available Help at Discharge: Family, Available PRN/intermittently, Available 24 hours/day Type of Home: House Home Access: Stairs to enter [employer] of Steps: 2 Entrance Stairs-Rails: None Home Layout: One level Bathroom Shower/Tub: Optometrist: Yes  Lives With: Spouse IADL History Homemaking Responsibilities: No Current License: Yes Mode of Transportation: Musician Occupation: Full time employment Type of Occupation: Works for [employer] comp Leisure and Hobbies: jigsaw puzzles Prior Function Level of Independence: Needs assistance with homemaking, Independent with gait, Independent with transfers  Able to [place]?: Yes Driving: Yes Vocation: Full time employment 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 Vision Baseline Vision/History: Wears glasses Wears Glasses: Reading only Patient Visual Report: No change from baseline Vision Assessment?: No apparent visual deficits Perception  Perception: Within Functional Limits Praxis Praxis: Intact Cognition Overall Cognitive Status: Within Functional Limits for tasks assessed Arousal/Alertness: Awake/alert Orientation Level: Person;Place;Situation Person: Oriented Place: Oriented Situation: Oriented Year: 2020 Month: February Day of Week: Correct Memory: Appears intact Immediate Memory Recall: Sock;Blue;Bed Memory Recall: Sock;Blue;Bed Memory Recall Sock: Without Cue Memory Recall Blue: Without Cue Memory Recall Bed: Without Cue Attention: Sustained Sustained Attention: Appears intact Awareness: Appears intact Problem Solving: Appears intact Safety/Judgment: Appears intact Sensation Sensation Light Touch: Impaired Detail Peripheral sensation comments: peripheral neuropathy Light Touch Impaired Details: Impaired RLE;Impaired LLE;Impaired RUE;Impaired LUE Proprioception: Impaired Detail Proprioception Impaired Details: Impaired RLE;Impaired LLE Coordination Gross Motor Movements are Fluid and Coordinated: No Fine Motor Movements are Fluid and Coordinated: Yes Coordination and Movement Description: limited by generalized weakness and neuropathy and hx R hemiparesis Finger Nose Finger Test: WNL B Motor  Motor Motor: Hemiplegia Motor - Skilled Clinical Observations: 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 Trunk/Postural Assessment  Cervical Assessment Cervical Assessment: Exceptions to WFL(Forward head) Thoracic Assessment Thoracic Assessment: Exceptions to WFL(Rounded shoulders; Kyphotic) Lumbar Assessment Lumbar Assessment: Within Functional Limits Postural Control Postural Control: Deficits on evaluation  Balance Balance Balance Assessed: Yes Static Sitting Balance Static Sitting - Balance Support: No upper extremity  supported;Feet supported Static Sitting - Level of Assistance: 6: Modified independent (Device/Increase time) Dynamic Sitting Balance Dynamic Sitting - Balance Support: No upper extremity supported;Feet supported;During functional activity Dynamic Sitting - Level of Assistance: 5: Stand by assistance Sitting balance - Comments: Sitting in w/c to complete bathing/dressing routine Static Standing Balance Static Standing - Balance Support: No upper extremity supported;During functional activity Static  Standing - Level of Assistance: 4: Min assist Dynamic Standing Balance Dynamic Standing - Balance Support: During functional activity;No upper extremity supported Dynamic Standing - Level of Assistance: 4: Min assist;3: Mod assist Dynamic Standing - Comments: Standing to complete LB clothing management and toileting task Extremity/Trunk Assessment RUE Assessment RUE Assessment: Exceptions to [facility] General Strength Comments: 4/5 throughout 2/2 L CVA in 2019. Grasp WFL RUE Body System: Neuro LUE Assessment LUE Assessment: Within Functional Limits     Refer to Care Plan for Long Term Goals  Recommendations for other services: Neuropsych   Discharge Criteria: Patient will be discharged from OT if patient refuses treatment 3 consecutive times without medical reason, if treatment goals not met, if there is a change in medical status, if patient makes no progress towards goals or if patient is discharged from hospital.  The above assessment, treatment plan, treatment alternatives and goals were discussed and mutually agreed upon: by patient  Jenilyn Magana L 02/14/2019, 6:07 PM

## 2019-02-14 NOTE — Plan of Care (Signed)
  Problem: Consults Goal: RH GENERAL PATIENT EDUCATION Description See Patient Education module for education specifics. Outcome: Progressing   Problem: RH SKIN INTEGRITY Goal: RH STG SKIN FREE OF INFECTION/BREAKDOWN Description No new breakdown with min assist   Outcome: Progressing Goal: RH STG ABLE TO PERFORM INCISION/WOUND CARE W/ASSISTANCE Description STG Able To Perform Incision/Wound Care With [employer].  Outcome: Progressing   Problem: RH PAIN MANAGEMENT Goal: RH STG PAIN MANAGED AT OR BELOW PT'S PAIN GOAL Description < 3 out of 10.   Outcome: Progressing

## 2019-02-14 NOTE — Patient Care Conference (Signed)
Inpatient RehabilitationTeam Conference and Plan of Care Update Date: 02/14/2019   Time: 2:15 PM    Patient Name: Heather [name]      Medical Record Number: [id number]  Date of Birth: December 26, 1971 Sex: Female         Room/Bed: 4M06C/4M06C-01 Payor Info: Payor: CIG[employer] / Plan: CIGNA MANAGED / Product Type: *No Product type* /    Admitting Diagnosis: peritonitis  Admit Date/Time:  02/13/2019  6:04 PM Admission Comments: No comment available   Primary Diagnosis:  <principal problem not specified> Principal Problem: <principal problem not specified>  Patient Active Problem List   Diagnosis Date Noted  . Labile blood pressure   . Pruritus   . Neuropathic pain   . Renovascular hypertension   . Anemia of chronic disease   . Diabetes mellitus type 2 in obese ([place])   . Labile blood glucose   . ESRD on dialysis ([place])   . Bacterial peritonitis ([place])   . Debility 02/13/2019  . Peritonitis, dialysis-associated, initial encounter ([place]) 02/05/2019  . Peritonitis, dialysis-associated ([place]) 02/05/2019  . Left ovarian cyst 02/05/2019  . Hemiplegia and hemiparesis following cerebral infarction affecting left non-dominant side ([place]) 12/29/2018  . Peritoneal dialysis catheter in place [facility]) 11/14/2018  . Pre-transplant evaluation for kidney transplant 11/07/2018  . ESRD with anemia ([place])   . ESRD (end stage renal disease) on dialysis ([place])   . Grade I diastolic dysfunction 19/37/9024  . Arteriovenous fistula ([place]) 07/21/2018  . Type 2 diabetes mellitus with end-stage renal disease ([place]) 07/21/2018  . History of cerebrovascular accident (CVA) involving cerebellum 07/21/2018  . Cyst of left ovary 01/12/2018  . Dysmenorrhea 01/12/2018  . Insomnia 01/12/2018  . Menorrhagia 01/12/2018  . Vitamin D deficiency 01/02/2018  . Bilateral lower extremity edema 01/02/2018  . Reactive depression 12/10/2017  . Arthralgia of right temporomandibular joint 10/24/2017  . Antiplatelet or antithrombotic  long-term use: Plavix 06/30/2017  . Morbid obesity due to excess calories ([place]) 11/16/2016  . Upper airway cough syndrome 11/15/2016  . DM (diabetes mellitus), type 2, uncontrolled, with renal complications ([place]) 09/73/5329  . Dyslipidemia associated with type 2 diabetes mellitus ([place]) 06/25/2016  . Uncontrolled type 2 diabetes mellitus with both eyes affected by proliferative retinopathy and macular edema, with long-term current use of insulin ([place]) 06/22/2016  . Uncontrolled type 2 diabetes mellitus with diabetic polyneuropathy, with long-term current use of insulin ([place]) 03/08/2016  . Proliferative diabetic retinopathy ([place]) 09/29/2015  . Constipation 09/03/2015  . Pseudophakia of both eyes 05/30/2015  . 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 B12 deficiency 05/10/2014  . Anemia associated with chronic renal failure, [place] 04/26/2014  . CKD (chronic kidney disease) stage 5, GFR less than 15 ml/min ([facility]) 04/26/2014  . Secondary hyperparathyroidism ([place]) 11/27/2013  . Posterior subcapsular cataract, bilateral 10/18/2013  . Diabetic neuropathy, painful ([place]), on low dose Gabapentin 07/13/2013  . Diabetes mellitus, type II, insulin dependent ([place]), on CGM, with end organ damage: renal, neuropathy, gastroparesis, retinopathy 02/19/2011  . Hypertension associated with diabetes ([place]) 02/19/2011  . Gastroparesis due to DM ([place]) 02/19/2011    Expected Discharge Date:    Team Members Present: Physician leading conference: Dr. Delice Lesch Social Worker Present: Ovidio Kin, LCSW Nurse Present: Rayetta Pigg, RN PT Present: Georjean Mode, PT OT Present: Amy Rounds, OT SLP Present: Weston Anna, SLP PPS Coordinator present : Gunnar Fusi     Current Status/Progress Goal Weekly Team Focus  Medical   Functional decline secondary to debility after PD relarted peritonitis/sepsis,  pt with history of right cerebellar infarct with residual right limb/truncal ataxia and balance  deficits  Improve mobility, infections, BP, pain  See above   Bowel/Bladder        continent of bowel     Swallow/Nutrition/ Hydration             ADL's     eval pending        Mobility   MOd assist stand pivot transfer, min assist gait wiht RW x 25' , S 2 STE home  MOd I transfers and gait, S up/down 4 steps 2 rails  strengthening, gait training, pt and family ed, DME, balance   Communication             Safety/Cognition/ Behavioral Observations            Pain        less than 3 will monitor     Skin        no skin issues        *See Care Plan and progress notes for long and short-term goals.     Barriers to Discharge  Current Status/Progress Possible Resolutions Date Resolved   Physician    Medical stability;IV antibiotics;Other (comments)  PD  See above  Therapies, cont multiple abx, optimize BP meds, optimize pain meds      Nursing                  PT  Decreased caregiver support;Medical stability;Hemodialysis                 OT                  SLP                SW                Discharge Planning/Teaching Needs:    Home with husband and family members to assist if needed.     Team Discussion:  New evaluation medically managing diabetes which is poorly controlled. IV antibiotics for 14 days. Neuropathy pain which limits her. Currently mod assist level. Goals to be mod/i-supervision level. Issues from CVA right hemparaesis and discoordination  Revisions to Treatment Plan:  New evaluation    Continued Need for Acute Rehabilitation Level of Care: The patient requires daily medical management by a physician with specialized training in physical medicine and rehabilitation for the following conditions: Daily direction of a multidisciplinary physical rehabilitation program to ensure safe treatment while eliciting the highest outcome that is of practical value to the patient.: Yes Daily medical management of patient stability for increased activity during  participation in an intensive rehabilitation regime.: Yes Daily analysis of laboratory values and/or radiology reports with any subsequent need for medication adjustment of medical intervention for : Neurological problems;Diabetes problems;Blood pressure problems;Renal problems;Other   I attest that I was present, lead the team conference, and concur with the assessment and plan of the team.   Elease Hashimoto 02/15/2019, 8:43 AM

## 2019-02-14 NOTE — Progress Notes (Signed)
Patient information reviewed and entered into eRehab System by Becky Chadd Tollison, PPS coordinator. Information including medical coding, function ability, and quality indicators will be reviewed and updated through discharge.   

## 2019-02-15 ENCOUNTER — Inpatient Hospital Stay (HOSPITAL_COMMUNITY): Payer: Managed Care, Other (non HMO) | Admitting: Occupational Therapy

## 2019-02-15 ENCOUNTER — Telehealth: Payer: Self-pay | Admitting: Family Medicine

## 2019-02-15 ENCOUNTER — Inpatient Hospital Stay (HOSPITAL_COMMUNITY): Payer: Managed Care, Other (non HMO)

## 2019-02-15 DIAGNOSIS — R0989 Other specified symptoms and signs involving the circulatory and respiratory systems: Secondary | ICD-10-CM

## 2019-02-15 LAB — CBC WITH DIFFERENTIAL/PLATELET
Abs Immature Granulocytes: 0.08 10*3/uL — ABNORMAL HIGH (ref 0.00–0.07)
BASOS PCT: 0 %
Basophils Absolute: 0 10*3/uL (ref 0.0–0.1)
Eosinophils Absolute: 0.2 10*3/uL (ref 0.0–0.5)
Eosinophils Relative: 2 %
HCT: 37.2 % (ref 36.0–46.0)
Hemoglobin: 11.8 g/dL — ABNORMAL LOW (ref 12.0–15.0)
Immature Granulocytes: 1 %
Lymphocytes Relative: 18 %
Lymphs Abs: 1.9 10*3/uL (ref 0.7–4.0)
MCH: 30.2 pg (ref 26.0–34.0)
MCHC: 31.7 g/dL (ref 30.0–36.0)
MCV: 95.1 fL (ref 80.0–100.0)
Monocytes Absolute: 0.7 10*3/uL (ref 0.1–1.0)
Monocytes Relative: 7 %
Neutro Abs: 7.4 10*3/uL (ref 1.7–7.7)
Neutrophils Relative %: 72 %
PLATELETS: 264 10*3/uL (ref 150–400)
RBC: 3.91 MIL/uL (ref 3.87–5.11)
RDW: 16.4 % — ABNORMAL HIGH (ref 11.5–15.5)
WBC: 10.2 10*3/uL (ref 4.0–10.5)
nRBC: 0.6 % — ABNORMAL HIGH (ref 0.0–0.2)

## 2019-02-15 LAB — RENAL FUNCTION PANEL
Albumin: 2.1 g/dL — ABNORMAL LOW (ref 3.5–5.0)
Anion gap: 14 (ref 5–15)
BUN: 35 mg/dL — ABNORMAL HIGH (ref 6–20)
CHLORIDE: 97 mmol/L — AB (ref 98–111)
CO2: 25 mmol/L (ref 22–32)
Calcium: 8.1 mg/dL — ABNORMAL LOW (ref 8.9–10.3)
Creatinine, Ser: 9.45 mg/dL — ABNORMAL HIGH (ref 0.44–1.00)
GFR calc Af Amer: 5 mL/min — ABNORMAL LOW (ref >60)
GFR calc non Af Amer: 4 mL/min — ABNORMAL LOW (ref >60)
Glucose, Bld: 193 mg/dL — ABNORMAL HIGH (ref 70–99)
Phosphorus: 6.2 mg/dL — ABNORMAL HIGH (ref 2.5–4.6)
Potassium: 3.5 mmol/L (ref 3.5–5.1)
Sodium: 136 mmol/L (ref 135–145)

## 2019-02-15 LAB — GLUCOSE, CAPILLARY
Glucose-Capillary: 169 mg/dL — ABNORMAL HIGH (ref 70–99)
Glucose-Capillary: 124 mg/dL — ABNORMAL HIGH (ref 70–99)
Glucose-Capillary: 167 mg/dL — ABNORMAL HIGH (ref 70–99)

## 2019-02-15 LAB — OCCULT BLOOD X 1 CARD TO LAB, STOOL: Fecal Occult Bld: NEGATIVE

## 2019-02-15 LAB — VANCOMYCIN, RANDOM: Vancomycin Rm: 20

## 2019-02-15 NOTE — Telephone Encounter (Signed)
I was notified by Dr. Briscoe Deutscher at the end of the day yesterday 02/14/2019 that a Patient Heather [name] scheduled the patient on 02/01/2019 for "cloudy urine and massive discharge" for the date of service 02/07/2019. Dr. Briscoe Deutscher stated "the patient is a high risk patient due to type 2 diabetes, end stage renal disease with dialysis and TTS and is now hospitalized."   After speaking with Heather [name], Practice Administrator this morning on how to appropriately place the safety zone, I placed a safety zone for this event today at 11:32am.

## 2019-02-15 NOTE — Telephone Encounter (Signed)
fYI  

## 2019-02-15 NOTE — Progress Notes (Cosign Needed)
Physical Therapy Session Note  Patient Details  Name: Heather [name] MRN: [id number] Date of Birth: 12/20/1971  Today's Date: 02/15/2019 PT Individual Time: 10:30 - 11:30 am    Short Term Goals: Week 1:  PT Short Term Goal 1 (Week 1): =LTG due to ELOS  Skilled Therapeutic Interventions/Progress Updates:     Pt received resting in w/c and agreeable to therapy. Ankle mobility assessed with decreased dorsiflexion noted in bil ankles; R ankle passive dorsiflexion lacking 3-4 degrees and L ankle dorsiflexion at 0 degrees. Pt performed 2 x 10 ankle pumps focusing on DF for increased heel cord stretch.  Pt reports feeling of nausea this morning. Pt states peripheral neuropathy has been a factor in decreased bil LE sensation for past 4-5 years.   Pt propelled w/c 120' with supervision over level tile and reports of nausea at rest.   Sit to stand with RW and max assist initially, progressing to min A at end of session..   Gait training 34' with RW and min A over level tile. Gait training and sit to stand performed with increased reliance on UEs. Shakiness noted in bil LEs with sit to stand. Bil knee valgus noted with bil ankle pronation and eversion. Pt reported feeling dizziness/spinning after walking, which resolved after 3 minute rest. Gait training 36' with RW and min A and turn to the L to sit. No experience of dizziness or nausea with second bout of walking.   Mini squats performed with RW x 10 for LE strength.    PT requested vestibular evaluation for symptoms of dizziness and pt c/o of spinning sensation, relieved somewhat by closing her eyes.   Pt left resting in the w/c with seat belt alarm set and all needs within reach.   Therapy Documentation Precautions:  Precautions Precautions: Fall Restrictions Weight Bearing Restrictions: No   Pain: Pain Assessment 0/10 Pain Scale: 0-10 Pain Score: 0-No pain    Therapy/Group: Individual Therapy  This therapist agrees with  information in this note, which accurately reflects treatment and patient's status; PT was present for entire session.  COOK,CAROLINE 02/15/2019, 7:58 AM

## 2019-02-15 NOTE — Progress Notes (Signed)
Occupational Therapy Session Note  Patient Details  Name: Heather [name] MRN: [id number] Date of Birth: 02-11-1971  Today's Date: 02/15/2019 OT Individual Time: 1300-1345 OT Individual Time Calculation (min): 45 min  and Today's Date: 02/15/2019 OT Missed Time: 30 Minutes Missed Time Reason: Patient fatigue   Short Term Goals: Week 1:  OT Short Term Goal 1 (Week 1): STG=LTG due to LOS  Skilled Therapeutic Interventions/Progress Updates:    1:1 Pt reports being very tired this afternoon and still dizziness with a lot of movement. Pt declined attempts to ambulate due to not feeling well but willing to transfer with min A to EOB at a very slow pace. Sitting EOB perform sit to stand and standing tolerance while working on puzzle on bedside table.  However unable to tolerate standing for longer than 2 min. Further asked questions about dizziness. Pt reports this "bout of vetigo" is similar to what I have had in the past." continued to report not feeling like this yesterday.  Felt like the room was spinning when she got up this morning. Pt able to track stimulus and perform saccades without trouble other than slow.  Pt reports cognitively feeling slow today; especially in her speech.  Pt opted to lay down in room - HOB elevated before laying back. Pt able to get into bed with supervision. .   Therapy Documentation Precautions:  Precautions Precautions: Fall Restrictions Weight Bearing Restrictions: No General: General OT Amount of Missed Time: 30 Minutes Vital Signs:  Pain:  pt with more fatigue and dizziness today impacting her endurance; no pain noted.   Therapy/Group: Individual Therapy  Willeen Cass [facility] 02/15/2019, 2:06 PM

## 2019-02-15 NOTE — Progress Notes (Signed)
Occupational Therapy Session Note  Patient Details  Name: Heather [name] MRN: [id number] Date of Birth: 01-20-1971  Today's Date: 02/15/2019 OT Individual Time: 0900-1000 OT Individual Time Calculation (min): 60 min    Short Term Goals: Week 1:  OT Short Term Goal 1 (Week 1): STG=LTG due to LOS  Skilled Therapeutic Interventions/Progress Updates:    Pt seen for OT session focusing on aDL re-training. Pt sitting eob upon arrival, denying pain and agreeable to tx session. Throughout session, she completed sit>stands with close [facility] increased time. SHe ambulated within room with min a to gather clothing items, reaching with R hand to obtain items with min A for balance, leaning into side of bed . She returned to EOB to dress, mod I UB dressing, steadying assist for standing balance at RW to pull pants up. She ambulated to sink and completed oral care in standing, heavy UE support on sink for balance and endurance.  She self propepelled w/c ~59ft  For UE strengthening/endurance, taken remainder of way to gym total A for time and energy conservation. Completed standing ball toss activity, standing at RW and alternating UE support to hit ball back and forthCGA for balance while ball tossing with second person tossed ball. Completed x3 sets, tolerating ~1 minute in standing before requiring seated rest break. Episode of complaints of nausea, provided with ginger ale, RN made aware and administered medication. Pt willing to cont as able. Completed x2 trials of ambulation, 1st trial weaving through cones to simulate home like environment, completed with min A, heavy UE reliance on RW. Seated rest break before completing next trial, each ~31ft. Extended seated rest breaks required btwn trials. Pt taken back to room in w/c total A. Pt left seated in w/c with all needs in reach.   Therapy Documentation Precautions:  Precautions Precautions: Fall Restrictions Weight Bearing  Restrictions: No Pain: Pain Assessment Pain Scale: 0-10 Pain Score: 0-No pain   Therapy/Group: Individual Therapy  Zain Bingman L 02/15/2019, 6:39 AM

## 2019-02-15 NOTE — Progress Notes (Signed)
Pharmacy Antibiotic Note  Heather [name] is a 48 y.o. female on Vancomycin and Ceftazidime for PD catheter-related peritonitis.  Day #10 antibiotics, begun at [facility] ED on 2/8.  VR this morning was therapeutic at 20. Had last HD on 2/19.   Plan: No further vancomycin needed Continue ceftazidime 1g IV Q24h thru 2/22  Antibiotics to stop on 2/22. Stop dates added   Height: 5\' 9"  (175.3 cm) Weight: 223 lb 12.3 oz (101.5 kg) IBW/kg (Calculated) : 66.2  Temp (24hrs), Avg:98.3 F (36.8 C), Min:97.6 F (36.4 C), Max:98.7 F (37.1 C)  Recent Labs  Lab 02/10/19 0646 02/11/19 0854 02/12/19 0621 02/13/19 1038 02/15/19 0826  WBC 8.4 10.0 9.3  --   --   CREATININE 7.66* 7.59*  --  7.93*  --   VANCORANDOM  --   --  18  --  20      Allergies  Allergen Reactions  . Ibuprofen Other (See Comments)    CKD stage 3. Should avoid.  . Dilaudid [Hydromorphone Hcl] Itching and Other (See Comments)    Can take with Benadryl.  . Tramadol Itching   Elenor Quinones, PharmD, BCPS, [facility] Clinical Pharmacist Phone number [phone] 02/15/2019 10:29 AM

## 2019-02-15 NOTE — Progress Notes (Signed)
Signed         Show:Clear all [x] Manual[] Template[] Copied  Added by: [x] Belva Chimes, RN  [] Hover for details Resting, respirations even and unlabored   upon rounding.eyes closed, appears asleep most of shift, no acute distress. Continue PD w/o complications and toward. Continue to assist with general needs and ADL care, Monitor, call bell within reach

## 2019-02-15 NOTE — Progress Notes (Addendum)
[facility] Progress Note   Subjective:   Patient seen in rehab. Changed to all 2.5% with 2L dwells last night. Did not tolerate 2.5L dwells previously due SOB. Reports she feels she did not get all of her fluid off but also reporting dizziness this AM.  UF 1.1L. BP labile. Denies SOB/dyspnea, CP. Reports nausea during therapy today. Denies vomiting, abdominal pain or diarrhea. Afebrile.  Objective Vitals:   02/14/19 1647 02/14/19 1933 02/15/19 0453 02/15/19 0600  BP: (!) 164/77 (!) 145/75 128/64 (!) 117/57  Pulse: 82 87 78 82  Resp: 18 16 12    Temp: 98.4 F (36.9 C) 98.6 F (37 C) 98.1 F (36.7 C) 98.7 F (37.1 C)  TempSrc: Oral Oral Oral Oral  SpO2: 100% 100% 99% 100%  Weight: 102.3 kg  102 kg 101.5 kg  Height:       Physical Exam General: Well developed female, in NAD Heart: RRR, no murmurs, rubs or gallops. Lungs:CTA bilateral upper lobes. Decreased breath sounds b/l lower quadrants but no wheezing, rhonchi or rales Abdomen: Soft, non distended. Mild tenderness around PD cath, no erythema or drainage. Extremities: No lower extremity edema  Additional Objective Labs: Basic Metabolic Panel: Recent Labs  Lab 02/10/19 0646 02/11/19 0854 02/13/19 1038  NA 133* 135 139  K 3.0* 3.1* 3.2*  CL 99 99 100  CO2 24 24 23   GLUCOSE 176* 230* 114*  BUN 41* 39* 34*  CREATININE 7.66* 7.59* 7.93*  CALCIUM 7.0* 7.6* 8.3*  PHOS 5.1*  --  4.7*   Liver Function Tests: Recent Labs  Lab 02/13/19 1038  ALBUMIN 2.2*   CBC: Recent Labs  Lab 02/10/19 0646 02/11/19 0854 02/12/19 0621  WBC 8.4 10.0 9.3  HGB 7.4* 10.6* 10.4*  HCT 24.1* 34.6* 34.6*  MCV 95.3 93.5 94.8  PLT 331 317 326   Blood Culture    Component Value Date/Time   SDES PERITONEAL DIALYSATE 02/08/2019 1050   SDES PERITONEAL DIALYSATE 02/08/2019 1050   SPECREQUEST NONE 02/08/2019 1050   SPECREQUEST NONE 02/08/2019 1050   CULT  02/08/2019 1050    NO GROWTH 5 DAYS Performed at [facility], [place] [address] [place], [place] [address]    REPTSTATUS 02/13/2019 FINAL 02/08/2019 1050   REPTSTATUS 02/08/2019 FINAL 02/08/2019 1050    CBG: Recent Labs  Lab 02/14/19 0633 02/14/19 1216 02/14/19 1638 02/14/19 2122 02/15/19 0644  GLUCAP 228* 158* 190* 291* 169*   Medications: . cefTAZidime (FORTAZ)  IV 1 g (02/14/19 1209)  . dialysis solution 4.25% low-MG/low-CA     . carvedilol  6.25 mg Oral BID WC  . darbepoetin (ARANESP) injection - NON-DIALYSIS  60 mcg Subcutaneous Q Wed-1800  . feeding supplement (PRO-STAT SUGAR FREE 64)  30 mL Oral BID  . gabapentin  100 mg Oral Daily  . gentamicin cream  1 application Topical Daily  . heparin  5,000 Units Subcutaneous Q8H  . insulin aspart  0-15 Units Subcutaneous TID WC  . insulin glargine  20 Units Subcutaneous Q lunch  . lidocaine-prilocaine  1 application Topical UD  . metroNIDAZOLE  1 Applicatorful Vaginal QHS  . polyethylene glycol  17 g Oral Daily  . senna-docusate  1 tablet Oral BID  . simvastatin  5 mg Oral QHS  . vancomycin variable dose per unstable renal function (pharmacist dosing)   Does not apply See admin instructions  . [START ON 02/20/2019] Vitamin D (Ergocalciferol)  50,000 Units Oral Q7 days    Dialysis Orders: CCPD 7X/week  with5 exchanges overnight,2.5L fill volume, dwelltime 1.5 hrs,no day bag or pause,EDW99kg edw. Typically uses combo of 2.5% and 4.25% fluid. - Mircera IM 50mg  (last given 11/28/18)  Assessment/Plan: 1. PD cath related peritonitis: 1st episode. Cell count-1st peritonitis episode, initial cell count 210 (81% neutrophils), PD fluid Cx 2/13 negative.On IV Fortaz/Vancomycin -will complete 2 week course (THRU 02/21/19). Will have outpatient PD team review sterile techniques, may be breaking off to use bathroom w/o proper reconnecting.  Improved, currently afebrile.  2. Debility: Continue rehab with CIR 3. ESRD:Continue CCPD. Using 2L volume at this time d/t SOB with higher volume  dwells - may need an add'l exchange if wants to continue this as outpatient. Had been using 4.25% for max UF - changed back to 2.5% last night, patient reports she does not feel she got all fluid off but also reporting some dizziness with lower blood pressure this AM.  Will use combo of 2.5% and 4.25% fluid tonight.  4. HTN/volume: No edema, BP variable but lower this AM. See above. 5. Anemiaof CKD: Hgb 10.4, s/p 2U PRBCs 2/15. Continue Aranesp weekly. 6.Metabolic Bone Disease: Corr Ca/Phos controlled, nobinders/VDRA.  7. Nutrition: Alb low, continue pro-stat supplement. 8. Hx CVA: management per primary 9. UncontrolledT2DM:Hgb A1C 9.9%,per primary  10. Hx respiratory arrest:In setting of iatrogenic narcotic overdose, resolved with narcan.  Anice Paganini, PA-C 02/15/2019, 11:04 AM  [facility] Pager: [phone]  Pt seen, examined and agree w A/P as above.  [facility] 02/15/2019, 3:00 PM

## 2019-02-15 NOTE — Progress Notes (Signed)
[facility] PHYSICAL MEDICINE & REHABILITATION PROGRESS NOTE  Subjective/Complaints: Patient seen laying in bed this morning.  She states she slept well overnight.  She states she had good first day of therapies yesterday.  Informed by therapies yesterday regarding?  Symptomatic blood pressures.  ROS: Denies CP, shortness of breath, nausea, vomiting, diarrhea.  Objective: Vital Signs: Blood pressure (!) 117/57, pulse 82, temperature 98.7 F (37.1 C), temperature source Oral, resp. rate 12, height 5\' 9"  (1.753 m), weight 101.5 kg, SpO2 100 %. No results found. No results for input(s): WBC, HGB, HCT, PLT in the last 72 hours. Recent Labs    02/13/19 1038  NA 139  K 3.2*  CL 100  CO2 23  GLUCOSE 114*  BUN 34*  CREATININE 7.93*  CALCIUM 8.3*    Physical Exam: BP (!) 117/57 (BP Location: Right Arm)   Pulse 82   Temp 98.7 F (37.1 C) (Oral)   Resp 12   Ht 5\' 9"  (1.753 m)   Wt 101.5 kg   SpO2 100%   BMI 33.04 kg/m  Constitutional: No distress . Vital signs reviewed.  Obese. HENT: Normocephalic.  Atraumatic. Eyes: EOMI. No discharge. Cardiovascular: RRR.  No JVD.  + Murmur. Respiratory: CTA bilaterally.  Normal effort. GI: BS +. Non-distended.  + PD cath Musc: No edema or tenderness in extremities. Neurological: Alert. Motor: Grossly 4/5 throughout  Skin: She isnot diaphoretic.  Psychiatric: She has anormal mood and affect. Herbehavior is normal.Judgmentand thought contentnormal.   Assessment/Plan: 1. Functional deficits secondary to debility which require 3+ hours per day of interdisciplinary therapy in a comprehensive inpatient rehab setting.  Physiatrist is providing close team supervision and 24 hour management of active medical problems listed below.  Physiatrist and rehab team continue to assess barriers to discharge/monitor patient progress toward functional and medical goals  Care Tool:  Bathing    Body parts bathed by patient: Right arm, Left upper  leg, Left arm, Right lower leg, Chest, Left lower leg, Abdomen, Front perineal area, Face, Buttocks, Right upper leg         Bathing assist Assist Level: Minimal Assistance - Patient > 75%     Upper Body Dressing/Undressing Upper body dressing   What is the patient wearing?: Hospital gown only    Upper body assist Assist Level: Contact Guard/Touching assist    Lower Body Dressing/Undressing Lower body dressing      What is the patient wearing?: Underwear/pull up, Pants     Lower body assist Assist for lower body dressing: Minimal Assistance - Patient > 75%     Toileting Toileting    Toileting assist Assist for toileting: Moderate Assistance - Patient 50 - 74%     Transfers Chair/bed transfer  Transfers assist  Chair/bed transfer activity did not occur: N/A  Chair/bed transfer assist level: Moderate Assistance - Patient 50 - 74%     Locomotion Ambulation   Ambulation assist      Assist level: Minimal Assistance - Patient > 75% Assistive device: Walker-rolling Max distance: 25'   Walk 10 feet activity   Assist     Assist level: Minimal Assistance - Patient > 75% Assistive device: Walker-rolling   Walk 50 feet activity   Assist Walk 50 feet with 2 turns activity did not occur: Safety/medical concerns         Walk 150 feet activity   Assist Walk 150 feet activity did not occur: Safety/medical concerns         Walk 10 feet on  uneven surface  activity   Assist Walk 10 feet on uneven surfaces activity did not occur: Safety/medical concerns         Wheelchair     Assist Will patient use wheelchair at discharge?: No Type of Wheelchair: Manual    Wheelchair assist level: Minimal Assistance - Patient > 75% Max wheelchair distance: 160ft    Wheelchair 50 feet with 2 turns activity    Assist        Assist Level: Minimal Assistance - Patient > 75%   Wheelchair 150 feet activity     Assist     Assist Level:  Minimal Assistance - Patient > 75%      Medical Problem List and Plan: 1.Functional declinesecondary todebilityafter PD relarted peritonitis/sepsis, 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 DVT Prophylaxis/Anticoagulation: Pharmaceutical:Heparin 3.Chronic back pain/Pain Management:No baclofen or muscle relaxers per Nephrology. ContinueTramadol as needed 4. Mood:LCSW to follow for evaluation and support 5. Neuropsych: This patientiscapable of making decisions on herown behalf. 6. Skin/Wound Care:Routine pressure relief measures. 7. Fluids/Electrolytes/Nutrition:Monitor I's and O's.  Filed Weights   02/14/19 1647 02/15/19 0453 02/15/19 0600  Weight: 102.3 kg 102 kg 101.5 kg  8. ESRD:Continue PD in the evenings. Nephrology to follow for PDassistance/management and correction of electrolyte abnormalities.[name]  9.PD associated peritonitis: Vancomycin/Fortazinitiated 2/08 at [facility]--to continuefor 14 total days (2/22).   10.T2DM: Decreased 2/18 due to hypoglycemia.Continue Lantus(on Tresiba at [employer] with meal coverage as well as sliding scale insulin for tighter blood sugar control. Monitor blood sugars AC at bedtime.   Labile on 2/20, will consider mealtime coverage if persistent  Monitor with increased mobility 11.Chronic constipation: Encourage compliance with bowel program.  12. Anemia of chronic disease:On Arnaesp weekly. Continue to monitor with serial checks.   Hemoglobin 10.4 on 2/17  Continue to monitor 13. Neuropathy: Gabapentin changed to 100 mg daily on 2/19 14. Bacterial vaginosis: On flagyl D# 4/5 15. HTN: Monitor BP bid. On coreg bid.  Labile on 2/20  Monitor with increased mobility 16. Persistent Hypokalemia: Recs per nephro 17.  Pruritus  Benadryl cream ordered on 2/19  Improving   LOS: 2 days A FACE TO FACE EVALUATION WAS PERFORMED  Heather [name] Heather [name] 02/15/2019, 8:22 AM

## 2019-02-16 ENCOUNTER — Inpatient Hospital Stay (HOSPITAL_COMMUNITY): Payer: Managed Care, Other (non HMO)

## 2019-02-16 ENCOUNTER — Inpatient Hospital Stay (HOSPITAL_COMMUNITY): Payer: Managed Care, Other (non HMO) | Admitting: Occupational Therapy

## 2019-02-16 DIAGNOSIS — R1110 Vomiting, unspecified: Secondary | ICD-10-CM

## 2019-02-16 DIAGNOSIS — R112 Nausea with vomiting, unspecified: Secondary | ICD-10-CM

## 2019-02-16 LAB — GLUCOSE, CAPILLARY
GLUCOSE-CAPILLARY: 214 mg/dL — AB (ref 70–99)
Glucose-Capillary: 305 mg/dL — ABNORMAL HIGH (ref 70–99)
Glucose-Capillary: 129 mg/dL — ABNORMAL HIGH (ref 70–99)
Glucose-Capillary: 135 mg/dL — ABNORMAL HIGH (ref 70–99)

## 2019-02-16 MED ORDER — DELFLEX-LC/4.25% DEXTROSE 483 MOSM/L IP SOLN: INTRAPERITONEAL | Status: DC

## 2019-02-16 MED ORDER — INSULIN ASPART 100 UNIT/ML ~~LOC~~ SOLN
3.0000 [IU] | Freq: Three times a day (TID) | SUBCUTANEOUS | Status: DC
  Administered 2019-02-16 – 2019-02-24 (×18): 3 [IU] via SUBCUTANEOUS

## 2019-02-16 MED ORDER — HEPARIN 1000 UNIT/ML FOR PERITONEAL DIALYSIS
INTRAPERITONEAL | Status: DC | PRN
  Filled 2019-02-16: qty 3000

## 2019-02-16 MED ORDER — HEPARIN 1000 UNIT/ML FOR PERITONEAL DIALYSIS: 500.0000 [IU] | INTRAMUSCULAR | Status: DC | PRN

## 2019-02-16 MED ORDER — GENTAMICIN SULFATE 0.1 % EX CREA
1.0000 "application " | TOPICAL_CREAM | Freq: Every day | CUTANEOUS | Status: DC
  Administered 2019-02-16 – 2019-02-18 (×2): 1 via TOPICAL
  Filled 2019-02-16: qty 15

## 2019-02-16 MED ORDER — DELFLEX-LC/2.5% DEXTROSE 394 MOSM/L IP SOLN: INTRAPERITONEAL | Status: DC

## 2019-02-16 NOTE — Progress Notes (Signed)
Alert, tolerating PD well, verbalizes no discomfort, monitor and assisted

## 2019-02-16 NOTE — Progress Notes (Signed)
[facility] PHYSICAL MEDICINE & REHABILITATION PROGRESS NOTE  Subjective/Complaints: Patient seen sitting up in bed this morning.  She states she did not sleep well overnight because she has been nauseous and vomiting since early this morning.  She states she is reluctant to take anti-emetics because she does not want to become dependent on them.  Encouraged medications in the short-term.  ROS: + Nausea/vomiting.  Denies CP, shortness of breath, diarrhea.  Objective: Vital Signs: Blood pressure 112/70, pulse 87, temperature 98.3 F (36.8 C), temperature source Oral, resp. rate 15, height 5\' 9"  (1.753 m), weight 102.5 kg, SpO2 95 %. No results found. Recent Labs    02/15/19 1323  WBC 10.2  HGB 11.8*  HCT 37.2  PLT 264   Recent Labs    02/13/19 1038 02/15/19 1323  NA 139 136  K 3.2* 3.5  CL 100 97*  CO2 23 25  GLUCOSE 114* 193*  BUN 34* 35*  CREATININE 7.93* 9.45*  CALCIUM 8.3* 8.1*    Physical Exam: BP 112/70 (BP Location: Right Arm)   Pulse 87   Temp 98.3 F (36.8 C) (Oral)   Resp 15   Ht 5\' 9"  (1.753 m)   Wt 102.5 kg   SpO2 95%   BMI 33.37 kg/m  Constitutional: No distress . Vital signs reviewed.  Obese. HENT: Normocephalic.  Atraumatic. Eyes: EOMI. No discharge. Cardiovascular: RRR.  No JVD.  + Murmur. Respiratory: CTA bilaterally.  Normal effort. GI: BS +. Non-distended.  + PD cath Musc: No edema or tenderness in extremities. Neurological: Alert. Motor: Grossly 4/5 throughout, unchanged Skin: She isnot diaphoretic.  Psychiatric: She has anormal mood and affect. Herbehavior is normal.Judgmentand thought contentnormal.   Assessment/Plan: 1. Functional deficits secondary to debility which require 3+ hours per day of interdisciplinary therapy in a comprehensive inpatient rehab setting.  Physiatrist is providing close team supervision and 24 hour management of active medical problems listed below.  Physiatrist and rehab team continue to assess  barriers to discharge/monitor patient progress toward functional and medical goals  Care Tool:  Bathing    Body parts bathed by patient: Right arm, Left upper leg, Left arm, Right lower leg, Chest, Left lower leg, Abdomen, Front perineal area, Face, Buttocks, Right upper leg         Bathing assist Assist Level: Minimal Assistance - Patient > 75%     Upper Body Dressing/Undressing Upper body dressing   What is the patient wearing?: Pull over shirt    Upper body assist Assist Level: Supervision/Verbal cueing    Lower Body Dressing/Undressing Lower body dressing      What is the patient wearing?: Underwear/pull up, Pants     Lower body assist Assist for lower body dressing: Minimal Assistance - Patient > 75%     Toileting Toileting    Toileting assist Assist for toileting: Moderate Assistance - Patient 50 - 74%     Transfers Chair/bed transfer  Transfers assist  Chair/bed transfer activity did not occur: N/A  Chair/bed transfer assist level: Moderate Assistance - Patient 50 - 74%     Locomotion Ambulation   Ambulation assist      Assist level: Minimal Assistance - Patient > 75% Assistive device: Walker-rolling Max distance: 56   Walk 10 feet activity   Assist     Assist level: Minimal Assistance - Patient > 75% Assistive device: Walker-rolling   Walk 50 feet activity   Assist Walk 50 feet with 2 turns activity did not occur: Safety/medical concerns  Assist  level: Minimal Assistance - Patient > 75% Assistive device: Walker-rolling    Walk 150 feet activity   Assist Walk 150 feet activity did not occur: Safety/medical concerns         Walk 10 feet on uneven surface  activity   Assist Walk 10 feet on uneven surfaces activity did not occur: Safety/medical concerns         Wheelchair     Assist Will patient use wheelchair at discharge?: No Type of Wheelchair: Manual    Wheelchair assist level: Supervision/Verbal  cueing Max wheelchair distance: 120    Wheelchair 50 feet with 2 turns activity    Assist        Assist Level: Supervision/Verbal cueing   Wheelchair 150 feet activity     Assist     Assist Level: Minimal Assistance - Patient > 75%      Medical Problem List and Plan: 1.Functional declinesecondary todebilityafter PD relarted peritonitis/sepsis, 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 DVT Prophylaxis/Anticoagulation: Pharmaceutical:Heparin 3.Chronic back pain/Pain Management:No baclofen or muscle relaxers per Nephrology. ContinueTramadol as needed 4. Mood:LCSW to follow for evaluation and support 5. Neuropsych: This patientiscapable of making decisions on herown behalf. 6. Skin/Wound Care:Routine pressure relief measures. 7. Fluids/Electrolytes/Nutrition:Monitor I's and O's.  Filed Weights   02/15/19 0600 02/15/19 1831 02/16/19 0516  Weight: 101.5 kg 101.9 kg 102.5 kg   Stable versus slowly trending up on 2/21 8. ESRD:Continue PD in the evenings. Nephrology to follow for PDassistance/management and correction of electrolyte abnormalities.[name]  9.PD associated peritonitis: Vancomycin/Fortazinitiated 2/08 at [facility]--to continuefor 14 total days (2/22).    Vanco within normal is on 2/20 10.T2DM: Decreased 2/18 due to hypoglycemia.Continue Lantus(on Tresiba at [employer] with meal coverage as well as sliding scale insulin for tighter blood sugar control. Monitor blood sugars AC at bedtime.   NovoLog 3 units 3 times daily started on 2/21  Labile on 2/21  Monitor with increased mobility 11.Chronic constipation: Encourage compliance with bowel program.  12. Anemia of chronic disease:On Arnaesp weekly. Continue to monitor with serial checks.   Hemoglobin 11.8 on 2/20  Continue to monitor 13. Neuropathy: Gabapentin changed to 100 mg daily on 2/19 14.  Bacterial vaginosis: On flagyl D# 5/5 15. HTN: Monitor BP bid. On coreg bid.  Relatively controlled on 2/21  Monitor with increased mobility 16. Persistent Hypokalemia: Recs per nephro 17.  Pruritus  Benadryl cream ordered on 2/19  Improving 18.  Nausea  Suspect related to multiple antibiotics, will monitor as antibiotics and completed  Encouraged PRN medications   LOS: 3 days A FACE TO FACE EVALUATION WAS PERFORMED  Tareka Jhaveri Lorie Phenix 02/16/2019, 9:01 AM

## 2019-02-16 NOTE — Progress Notes (Signed)
[facility] Progress Note   Subjective:   Patient seen in rehab. Changed to all 2.5% with 2L dwells last night. Did not tolerate 2.5L dwells previously due SOB. Reports she feels she did not get all of her fluid off but also reporting dizziness this AM.  UF 1.1L. BP labile. Denies SOB/dyspnea, CP. Reports nausea during therapy today. Denies vomiting, abdominal pain or diarrhea. Afebrile.  Objective Vitals:   02/15/19 1831 02/15/19 2000 02/16/19 0516 02/16/19 0923  BP: 132/62 (!) 119/56 112/70 136/72  Pulse: 88 82 87 82  Resp: 16 17 15 16   Temp: 98 F (36.7 C) 98.7 F (37.1 C) 98.3 F (36.8 C) 98.5 F (36.9 C)  TempSrc: Oral Oral Oral Oral  SpO2: 98% 97% 95% 99%  Weight: 101.9 kg  102.5 kg 101.3 kg  Height:       Physical Exam General: Well developed female, in NAD Heart: RRR, no murmurs, rubs or gallops. Lungs:CTA bilateral upper lobes. Decreased breath sounds b/l lower quadrants but no wheezing, rhonchi or rales Abdomen: Soft, non distended. Mild tenderness around PD cath, no erythema or drainage. Extremities: No lower extremity edema  Additional Objective Labs: Basic Metabolic Panel: Recent Labs  Lab 02/10/19 0646 02/11/19 0854 02/13/19 1038 02/15/19 1323  NA 133* 135 139 136  K 3.0* 3.1* 3.2* 3.5  CL 99 99 100 97*  CO2 24 24 23 25   GLUCOSE 176* 230* 114* 193*  BUN 41* 39* 34* 35*  CREATININE 7.66* 7.59* 7.93* 9.45*  CALCIUM 7.0* 7.6* 8.3* 8.1*  PHOS 5.1*  --  4.7* 6.2*   Liver Function Tests: Recent Labs  Lab 02/13/19 1038 02/15/19 1323  ALBUMIN 2.2* 2.1*   CBC: Recent Labs  Lab 02/10/19 0646 02/11/19 0854 02/12/19 0621 02/15/19 1323  WBC 8.4 10.0 9.3 10.2  NEUTROABS  --   --   --  7.4  HGB 7.4* 10.6* 10.4* 11.8*  HCT 24.1* 34.6* 34.6* 37.2  MCV 95.3 93.5 94.8 95.1  PLT 331 317 326 264   Blood Culture    Component Value Date/Time   SDES PERITONEAL DIALYSATE 02/08/2019 1050   SDES PERITONEAL DIALYSATE 02/08/2019 1050   SPECREQUEST NONE 02/08/2019 1050   SPECREQUEST NONE 02/08/2019 1050   CULT  02/08/2019 1050    NO GROWTH 5 DAYS Performed at [facility], [place] [address] [place], [place] [address]    REPTSTATUS 02/13/2019 FINAL 02/08/2019 1050   REPTSTATUS 02/08/2019 FINAL 02/08/2019 1050    CBG: Recent Labs  Lab 02/15/19 0644 02/15/19 1131 02/15/19 1633 02/16/19 0700 02/16/19 1133  GLUCAP 169* 124* 167* 305* 129*   Medications: . cefTAZidime (FORTAZ)  IV 1 g (02/15/19 1231)  . dialysis solution 4.25% low-MG/low-CA     . carvedilol  6.25 mg Oral BID WC  . darbepoetin (ARANESP) injection - NON-DIALYSIS  60 mcg Subcutaneous Q Wed-1800  . feeding supplement (PRO-STAT SUGAR FREE 64)  30 mL Oral BID  . gabapentin  100 mg Oral Daily  . gentamicin cream  1 application Topical Daily  . heparin  5,000 Units Subcutaneous Q8H  . insulin aspart  0-15 Units Subcutaneous TID WC  . insulin aspart  3 Units Subcutaneous TID WC  . insulin glargine  20 Units Subcutaneous Q lunch  . lidocaine-prilocaine  1 application Topical UD  . metroNIDAZOLE  1 Applicatorful Vaginal QHS  . polyethylene glycol  17 g Oral Daily  . senna-docusate  1 tablet Oral BID  . simvastatin  5 mg Oral QHS  .  vancomycin variable dose per unstable renal function (pharmacist dosing)   Does not apply See admin instructions  . [START ON 02/20/2019] Vitamin D (Ergocalciferol)  50,000 Units Oral Q7 days    Dialysis Orders: [facility] 7X/week with5 exchanges overnight,2.5L fill volume, dwelltime 1.5 hrs,no day bag or pause,EDW99kg edw. Typically uses combo of 2.5% and 4.25% fluid. - Mircera IM 50mg  (last given 11/28/18)  Assessment/Plan: 1. PD cath related peritonitis: 1st episode. Cell count-1st peritonitis episode, initial cell count 210 (81% neutrophils), PD fluid Cx 2/13 negative.On IV Fortaz/Vancomycin -will complete 2 week course (THRU 02/21/19). Will have outpatient PD team review sterile techniques, may be breaking off to  use bathroom w/o proper reconnecting.  Improved, currently afebrile.  2. Debility: Continue rehab with CIR 3. ESRD:Continue [facility]CPD. Using 2L volume at this time d/t SOB with higher volume dwells. Up 1-2kg today, will use combo of 2.5% and 4.25% fluid tonight.  4. HTN/volume: No edema, BP variable but lower this AM. See above. 5. Anemiaof CKD: Hgb 10.4, s/p 2U PRBCs 2/15. Continue Aranesp weekly. 6.Metabolic Bone Disease: Corr Ca/Phos controlled, nobinders/VDRA.  7. Nutrition: Alb low, continue pro-stat supplement. 8. Hx CVA: management per primary 9. UncontrolledT2DM:Hgb A1C 9.9%,per primary  10. Hx respiratory arrest: iatrogenic narcotic overdose, resolved with narcan.   [facility] 02/16/2019, 12:45 PM

## 2019-02-16 NOTE — Progress Notes (Signed)
Physical Therapy Session Note  Patient Details  Name: Heather [name] MRN: [id number] Date of Birth: [birth year]  Today's Date: 02/16/2019 PT Individual Time: 1300-1400 PT Individual Time Calculation (min): 60 min   Short Term Goals: Week 1:  PT Short Term Goal 1 (Week 1): =LTG due to ELOS  Skilled Therapeutic Interventions/Progress Updates:    Completed vestibular assessment as noted below.  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  Now exacerbated due to illness/bedrest.  Educated in gaze stabilization exercises to be performed during therapy sessions only seated EOB without back support.  Also discussed possibly treating for mild L posterior canal BPPV when not as symptomatic.  Patient instructed in compensation techniques with using target to focus on while mobilizing for improved tolerance.  Sit to stand and ambulated with min to mod A due to LOB when turning to L with RW x about 75' cues throughout for visual target.  Patient reported dizziness 7/10 with ambulation and 10/10 with turning with RW.  Left in supine with bed alarm resting with needs in reach prior to next session. Handout issued end of day with seated VOR for gaze stabilization.   Therapy Documentation Precautions:  Precautions Precautions: Fall Restrictions Weight Bearing Restrictions: No Pain: Pain Assessment Pain Scale: Faces Faces Pain Scale: Hurts a little bit Pain Type: Acute pain Pain Location: Chest Pain Descriptors / Indicators: Sore Pain Onset: With Activity Pain Intervention(s): Repositioned    Other Treatments:    Vestibular Assessment - 02/16/19 1643      Vestibular Assessment   General Observation  Reports history of vertigo back when had her stroke.  States was bad with N&V about a week then still dizzy another week.  Also reports had imbalance/ataxic gait then too, but got better.       Symptom Behavior   Type of Dizziness  Spinning    Frequency of Dizziness  intermittent    Duration of Dizziness  minute to minute and half    Aggravating Factors  Activity in general    Relieving Factors  Closing eyes;Rest      Occulomotor Exam   Occulomotor Alignment  Normal    Spontaneous  Absent    Gaze-induced  Right beating nystagmus with R gaze    Smooth Pursuits  Intact    Saccades  Intact      Vestibulo-Occular Reflex   VOR 1 Head Only (x 1 viewing)  intact with horizontal and vertical head movements, but incites dizziness espeically when turning head to L      Auditory   Comments  intact to scratch test and equal bilateral      Positional Testing   Sidelying Test  Sidelying Right;Sidelying Left    Horizontal Canal Testing  Horizontal Canal Right;Horizontal Canal Left      Sidelying Right   Sidelying Right Duration  35 sec    Sidelying Right Symptoms  No nystagmus   but c/o spinning lastng <10 sec     Sidelying Left   Sidelying Left Duration  35 sec    Sidelying Left Symptoms  Upbeat, left rotatory nystagmus   couple beats noted initially; spinning not as bad as R     Horizontal Canal Right   Horizontal Canal Right Duration  30 sec    Horizontal Canal Right Symptoms  Normal      Horizontal Canal Left   Horizontal Canal Left Duration  30sec    Horizontal Canal Left Symptoms  Normal         Therapy/Group: Individual Therapy  Reginia Naas 02/16/2019, 4:43 PM

## 2019-02-16 NOTE — Progress Notes (Signed)
Occupational Therapy Session Note  Patient Details  Name: Heather [name] MRN: [id number] Date of Birth: Jan 23, 1971  Today's Date: 02/16/2019 OT Individual Time: 9323-5573 OT Individual Time Calculation (min): 57 min    Short Term Goals: Week 1:  OT Short Term Goal 1 (Week 1): STG=LTG due to LOS  Skilled Therapeutic Interventions/Progress Updates:    1;1. Pt reporting 5/10 chest soreness. Pt declines intervention. Pt BP at beginning of session 111/66. UB exercise seated in w/c at beginning of session to improve BP as follows: 3x30 ball toss (chest, bounce and overhead pass) for BOR coordination and NMR of RUE. 4 # dowel rod HEP 1x15 as follows seated in w/c with demo cues for BUE strengthening required for BADLS: shoulder flex/ext, press, elbow flex/ext, chest press, ab/adduction, int/ext rotation, circles forward/backward   118/68 after ball toss 122/70After dowel rod HEP  Pt reporting need to toilet. Pt completes stand pivot transfer with grab bar and CGA for clothing management. Pt able to void bowels seated on BSC. Pt washes hands seated in w/c and remains in w/c to visit with family members.  Therapy Documentation Precautions:  Precautions Precautions: Fall Restrictions Weight Bearing Restrictions: No General: General OT Amount of Missed Time: 15 Minutes Vital Signs: Therapy Vitals Temp: 98.6 F (37 C) Temp Source: Oral Pulse Rate: 86 Resp: 18 BP: 111/66 Patient Position (if appropriate): Sitting Oxygen Therapy SpO2: 98 % O2 Device: Room Air Pain:   ADL:   Vision   Perception    Praxis   Exercises:   Other Treatments:     Therapy/Group: Individual Therapy  [name] 02/16/2019, 2:24 PM

## 2019-02-16 NOTE — Progress Notes (Signed)
Occupational Therapy Session Note  Patient Details  Name: Heather [name] MRN: [id number] Date of Birth: 12-Feb-1971  Today's Date: 02/16/2019 OT Individual Time: 0940-1040 OT Individual Time Calculation (min): 60 min  and Today's Date: 02/16/2019 OT Missed Time: 15 Minutes Missed Time Reason: Nursing care   Short Term Goals: Week 1:  OT Short Term Goal 1 (Week 1): STG=LTG due to LOS  Skilled Therapeutic Interventions/Progress Updates:    RN present disconnecting pt from PD upon arrival, missed 15 of skilled tx time 2/2 nursing care.   Therapist returned and assisted with sit>stand from EOB for RN to obtain weight, min A to stand from EOB. Following RN care, pt willing to participate as able, however, with complaints of nausea which she has had through the night.  Completed stand pivot transfer to w/c with CGA using RW. Completed bathing/ressing routine from w/c level at sink, set-up assist for UB bathing/dressing and supervision to stand at sink to complete pericare/buttock hygiene, maintaining 1 UE support on sink for stability at all times. Grooming tasks completed seated in w/c mod I for energy conservation. LB dressing completed with close supervision, sit>stand at [facility] for LB clothing management and VCs/education for energy conservation techniques. Pt taken to therapy gym total A in w/c for time and energy conservation. Stand pivot with supervision to EOM. Completed standing table top task standing on non-compliant foam mat with CGA for balance while completing puzzle for LE strengthening/endurance. Pt tolerating ~5 minutes in standing before requiring seated rest break.  Pt returned to room at end of session in w/c. Ambulated ~32ft in hallway back to room with min A using RW.  Pt left in side-lying for comfort at end of session, all needs in reach and bed alarm on.  Pt with intermittent complaints of dizziness/ "head spinning" throughout session, not consistent with any movement  patterns. Symptoms dissipated with standing or sitting rest breaks.   Therapy Documentation Precautions:  Precautions Precautions: Fall Restrictions Weight Bearing Restrictions: No Pain:     Therapy/Group: Individual Therapy  Bertin Inabinet L 02/16/2019, 6:35 AM

## 2019-02-17 ENCOUNTER — Inpatient Hospital Stay (HOSPITAL_COMMUNITY): Payer: Managed Care, Other (non HMO)

## 2019-02-17 LAB — GLUCOSE, CAPILLARY
Glucose-Capillary: 381 mg/dL — ABNORMAL HIGH (ref 70–99)
Glucose-Capillary: 223 mg/dL — ABNORMAL HIGH (ref 70–99)
Glucose-Capillary: 116 mg/dL — ABNORMAL HIGH (ref 70–99)
Glucose-Capillary: 173 mg/dL — ABNORMAL HIGH (ref 70–99)

## 2019-02-17 NOTE — Progress Notes (Signed)
Heather [name] is a 48 y.o. female 1971-05-24 [id number]  Subjective: Dizziness preventing participation in therapies - asks about fluid exchange yesterday at lower than usual volume for PD.  Objective: Vital signs in last 24 hours: Temp:  [97.6 F (36.4 C)-98.6 F (37 C)] 97.6 F (36.4 C) (02/22 0830) Pulse Rate:  [85-96] 85 (02/22 0830) Resp:  [14-18] 16 (02/22 0830) BP: (111-146)/(56-78) 118/71 (02/22 0830) SpO2:  [96 %-100 %] 100 % (02/22 0830) Weight:  [101.2 kg-102.6 kg] 101.2 kg (02/22 0830) Weight change: -0.6 kg Last BM Date: 02/16/19  Intake/Output from previous day: 02/21 0701 - 02/22 0700 In: 622 [P.O.:622] Out: -   Physical Exam General: No apparent distress    Lungs: Normal effort. Lungs clear to auscultation, no crackles or wheezes. Cardiovascular: Regular rate and rhythm, no edema   Lab Results: BMET    Component Value Date/Time   NA 136 02/15/2019 1323   NA 137 05/09/2018   K 3.5 02/15/2019 1323   K 4.3 10/16/2013   CL 97 (L) 02/15/2019 1323   CL 100 10/16/2013   CO2 25 02/15/2019 1323   GLUCOSE 193 (H) 02/15/2019 1323   BUN 35 (H) 02/15/2019 1323   BUN 36 (A) 05/09/2018   CREATININE 9.45 (H) 02/15/2019 1323   CREATININE 3.05 (H) 09/03/2015 0855   CALCIUM 8.1 (L) 02/15/2019 1323   CALCIUM 8.9 10/16/2013   GFRNONAA 4 (L) 02/15/2019 1323   GFRNONAA 18 (L) 09/03/2015 0855   GFRAA 5 (L) 02/15/2019 1323   GFRAA 20 (L) 09/03/2015 0855   CBC    Component Value Date/Time   WBC 10.2 02/15/2019 1323   RBC 3.91 02/15/2019 1323   HGB 11.8 (L) 02/15/2019 1323   HCT 37.2 02/15/2019 1323   PLT 264 02/15/2019 1323   MCV 95.1 02/15/2019 1323   MCH 30.2 02/15/2019 1323   MCHC 31.7 02/15/2019 1323   RDW 16.4 (H) 02/15/2019 1323   LYMPHSABS 1.9 02/15/2019 1323   MONOABS 0.7 02/15/2019 1323   EOSABS 0.2 02/15/2019 1323   BASOSABS 0.0 02/15/2019 1323   CBG's (last 3):   Recent Labs    02/16/19 2120 02/17/19 0654 02/17/19 1141  GLUCAP 214*  381* 223*   LFT's Lab Results  Component Value Date   ALT 31 02/07/2019   AST 31 02/07/2019   ALKPHOS 106 02/07/2019   BILITOT 0.6 02/07/2019    Studies/Results: No results found.  Medications:  I have reviewed the patient's current medications. Scheduled Medications: . carvedilol  6.25 mg Oral BID WC  . darbepoetin (ARANESP) injection - NON-DIALYSIS  60 mcg Subcutaneous Q Wed-1800  . feeding supplement (PRO-STAT SUGAR FREE 64)  30 mL Oral BID  . gabapentin  100 mg Oral Daily  . gentamicin cream  1 application Topical Daily  . heparin  5,000 Units Subcutaneous Q8H  . insulin aspart  0-15 Units Subcutaneous TID WC  . insulin aspart  3 Units Subcutaneous TID WC  . insulin glargine  20 Units Subcutaneous Q lunch  . lidocaine-prilocaine  1 application Topical UD  . polyethylene glycol  17 g Oral Daily  . senna-docusate  1 tablet Oral BID  . simvastatin  5 mg Oral QHS  . vancomycin variable dose per unstable renal function (pharmacist dosing)   Does not apply See admin instructions  . [START ON 02/20/2019] Vitamin D (Ergocalciferol)  50,000 Units Oral Q7 days   PRN Medications: acetaminophen, aluminum hydroxide, bisacodyl, diphenhydrAMINE, diphenhydrAMINE, diphenhydrAMINE-zinc acetate, guaiFENesin-dextromethorphan, dianeal solution for CAPD/CCPD with  heparin, dianeal solution for CAPD/CCPD with heparin, lubiprostone, ondansetron (ZOFRAN) IV, polyethylene glycol, promethazine, sodium phosphate, traMADol, traZODone  Assessment/Plan: Active Problems:   Debility   Pruritus   Neuropathic pain   Renovascular hypertension   Anemia of chronic disease   Diabetes mellitus type 2 in obese (HCC)   Labile blood glucose   ESRD on dialysis ([place])   Bacterial peritonitis ([place])   Labile blood pressure   Non-intractable vomiting   1. Debility following PD cath induced peritonitis - continue CIR therapies and support as scheduled with renal mgmt of medical conditions as ongoing 2. ESRD on  CCPD - per renal including volume and electrolytes + Anemia CD 3. DM2, uncontrolled and labile - decreased basal insulin due to hypoglycemia 2/18 - continue SSI and Lantus 4. Neuropathy - gabapentin as rx'd 5. BV - complete Flagyl as rx'd - nausea side effects of abx has resolved  Length of stay, days: 4  Valery Amedee A. Asa Lente, MD 02/17/2019, 11:48 AM

## 2019-02-17 NOTE — Progress Notes (Signed)
Occupational Therapy Session Note  Patient Details  Name: Heather [name] MRN: [id number] Date of Birth: Apr 30, 1971  Today's Date: 02/17/2019 OT Concurrent Time: 1345-1445 OT Concurrent Time Calculation (min): 60 min   Short Term Goals: Week 1:  OT Short Term Goal 1 (Week 1): STG=LTG due to LOS  Skilled Therapeutic Interventions/Progress Updates:     No pain reported. Pt received in bed, teds applied and socks donned. Pt BP WNL throughout session despite symptoms of dizziness. Pt plays game of connect 4 with focus on standing balance and tolerance and dynamic reaching with RW and CGA for balance. Pt completes no sew blanket activity with focus on [facility] and standing tolerance for 3 rounds of standing while fastening knots of cut fabric. Pt completes 3x30 beach ball volleys using 5# dowl rod to bounce beach ball back for improved BUE strengthening and endurance required for BADLs. Exited session with pt seated in w/c with family and friends present in room to visit.   Therapy Documentation Precautions:  Precautions Precautions: Fall Restrictions Weight Bearing Restrictions: No   Therapy/Group: concurrnet  [name] 02/17/2019, 2:54 PM

## 2019-02-17 NOTE — Progress Notes (Addendum)
[facility] Progress Note   Subjective: Awake, Alert No C/Os SOB, does say she has chest soreness from CPR. Didn't feel like she completely drained after last PD exchange.   Objective Vitals:   02/16/19 1811 02/16/19 1950 02/17/19 0537 02/17/19 0830  BP: (!) 146/78 (!) 111/56 113/70 118/71  Pulse: 96 88 85 85  Resp: 16 14 16 16   Temp: 98.5 F (36.9 C) 97.9 F (36.6 C) 98.1 F (36.7 C) 97.6 F (36.4 C)  TempSrc: Oral  Oral Oral  SpO2: 98% 98% 96% 100%  Weight: 102 kg  102.6 kg 101.2 kg  Height:       Physical Exam General: WN,WD, NAD Heart: S1,S2 RRR Lungs: BS slightly decreased in bases otherwise CTAB A/P Abdomen: Active BS, Still slightly tender to palpation.  Extremities: No LE edema   Additional Objective Labs: Basic Metabolic Panel: Recent Labs  Lab 02/11/19 0854 02/13/19 1038 02/15/19 1323  NA 135 139 136  K 3.1* 3.2* 3.5  CL 99 100 97*  CO2 24 23 25   GLUCOSE 230* 114* 193*  BUN 39* 34* 35*  CREATININE 7.59* 7.93* 9.45*  CALCIUM 7.6* 8.3* 8.1*  PHOS  --  4.7* 6.2*   Liver Function Tests: Recent Labs  Lab 02/13/19 1038 02/15/19 1323  ALBUMIN 2.2* 2.1*   No results for input(s): LIPASE, AMYLASE in the last 168 hours. CBC: Recent Labs  Lab 02/11/19 0854 02/12/19 0621 02/15/19 1323  WBC 10.0 9.3 10.2  NEUTROABS  --   --  7.4  HGB 10.6* 10.4* 11.8*  HCT 34.6* 34.6* 37.2  MCV 93.5 94.8 95.1  PLT 317 326 264   Blood Culture    Component Value Date/Time   SDES PERITONEAL DIALYSATE 02/08/2019 1050   SDES PERITONEAL DIALYSATE 02/08/2019 1050   SPECREQUEST NONE 02/08/2019 1050   SPECREQUEST NONE 02/08/2019 1050   CULT  02/08/2019 1050    NO GROWTH 5 DAYS Performed at [facility], [place] [address], [place], [place] [address]    REPTSTATUS 02/13/2019 FINAL 02/08/2019 1050   REPTSTATUS 02/08/2019 FINAL 02/08/2019 1050    Cardiac Enzymes: No results for input(s): CKTOTAL, CKMB, CKMBINDEX, TROPONINI in the last 168  hours. CBG: Recent Labs  Lab 02/16/19 1133 02/16/19 1634 02/16/19 2120 02/17/19 0654 02/17/19 1141  GLUCAP 129* 135* 214* 381* 223*   Iron Studies: No results for input(s): IRON, TIBC, TRANSFERRIN, FERRITIN in the last 72 hours. @lablastinr3 @ Studies/Results: No results found. Medications: . dialysis solution 2.5% low-MG/low-CA    . dialysis solution 4.25% low-MG/low-CA     . carvedilol  6.25 mg Oral BID WC  . darbepoetin (ARANESP) injection - NON-DIALYSIS  60 mcg Subcutaneous Q Wed-1800  . feeding supplement (PRO-STAT SUGAR FREE 64)  30 mL Oral BID  . gabapentin  100 mg Oral Daily  . gentamicin cream  1 application Topical Daily  . heparin  5,000 Units Subcutaneous Q8H  . insulin aspart  0-15 Units Subcutaneous TID WC  . insulin aspart  3 Units Subcutaneous TID WC  . insulin glargine  20 Units Subcutaneous Q lunch  . lidocaine-prilocaine  1 application Topical UD  . polyethylene glycol  17 g Oral Daily  . senna-docusate  1 tablet Oral BID  . simvastatin  5 mg Oral QHS  . vancomycin variable dose per unstable renal function (pharmacist dosing)   Does not apply See admin instructions  . [START ON 02/20/2019] Vitamin D (Ergocalciferol)  50,000 Units Oral Q7 days     Dialysis  Orders: CCPD 7X/week with5 exchanges overnight,2.5L fill volume, dwelltime 1.5 hrs,no day bag or pause,EDW99kg edw. Typically uses combo of 2.5% and 4.25% fluid. - Mircera IM 50mg  (last given 11/28/18)  Assessment/Plan: 1. PD cath related peritonitis: 1st episode. Cell count-1st peritonitis episode, initial cell count 210 (81% neutrophils), PD fluid Cx 2/8 and 2/13 negative.Has completed 2 wks empiric vanc/ Tressie Ellis 2/8-  2/22.  Will have outpatient PD team review sterile techniques, may be breaking off to use bathroom at night w/o proper reconnecting.  2. Debility:Continue rehab with CIR 3. ESRD:Continue CCPD. Using 2L volume at this time d/t SOB with higher volume dwells. Wt 101.2 kg this AM.  Will continue use combo of 2.5% and 4.25% fluid. K+ 3.5 4. HTN/volume: No edema, BP variable but lower this AM. See above. 5. Anemiaof CKD: Hgb 11.8 s/p 2U PRBCs 2/15. Continue Aranesp weekly. 6.Metabolic Bone Disease: Corr Ca/Phos controlled, nobinders/VDRA.  7. Nutrition:Alb low, continue pro-stat supplement. 8. Hx CVA: management per primary 9. UncontrolledT2DM:Hgb A1C 9.9%,per primary  10. Hx respiratory arrest: iatrogenic narcotic overdose, resolved with narcan  Rita H. Brown NP-C 02/17/2019, 1:24 PM  [facility] [phone]  Pt seen, examined and agree w A/P as above.  [facility] 02/17/2019, 2:49 PM

## 2019-02-18 ENCOUNTER — Inpatient Hospital Stay (HOSPITAL_COMMUNITY): Payer: Managed Care, Other (non HMO) | Admitting: Occupational Therapy

## 2019-02-18 ENCOUNTER — Inpatient Hospital Stay (HOSPITAL_COMMUNITY): Payer: Managed Care, Other (non HMO) | Admitting: Physical Therapy

## 2019-02-18 ENCOUNTER — Encounter (HOSPITAL_COMMUNITY): Payer: Managed Care, Other (non HMO) | Admitting: Occupational Therapy

## 2019-02-18 LAB — GLUCOSE, CAPILLARY
GLUCOSE-CAPILLARY: 236 mg/dL — AB (ref 70–99)
Glucose-Capillary: 209 mg/dL — ABNORMAL HIGH (ref 70–99)
Glucose-Capillary: 195 mg/dL — ABNORMAL HIGH (ref 70–99)
Glucose-Capillary: 169 mg/dL — ABNORMAL HIGH (ref 70–99)
Glucose-Capillary: 223 mg/dL — ABNORMAL HIGH (ref 70–99)

## 2019-02-18 MED ORDER — GENTAMICIN SULFATE 0.1 % EX CREA
1.0000 "application " | TOPICAL_CREAM | Freq: Every day | CUTANEOUS | Status: DC
  Administered 2019-02-19 – 2019-02-20 (×2): 1 via TOPICAL
  Filled 2019-02-18: qty 15

## 2019-02-18 MED ORDER — HEPARIN 1000 UNIT/ML FOR PERITONEAL DIALYSIS: 500.0000 [IU] | INTRAMUSCULAR | Status: DC | PRN

## 2019-02-18 NOTE — Progress Notes (Signed)
Physical Therapy Session Note  Patient Details  Name: Heather [name] MRN: [id number] Date of Birth: Jul 26, 1971  Today's Date: 02/18/2019 PT Individual Time: 1300-1327 PT Individual Time Calculation (min): 27 min   Short Term Goals: Week 1:  PT Short Term Goal 1 (Week 1): =LTG due to ELOS  Skilled Therapeutic Interventions/Progress Updates:  Pt was seen bedside in the pm. Pt propelled w/c 150 feet x 2 with B LEs and S with increased time. Pt performed multiple sit to stand transfers with rolling walker and c/g. Pt ambulated 75 feet x 2 with rolling walker and c/g. Pt returned to room following treatment and left sitting up in w/c with husband at bedside.   Therapy Documentation Precautions:  Precautions Precautions: Fall Restrictions Weight Bearing Restrictions: No General:   Pain: No c/o pain.    Therapy/Group: Individual Therapy  Dub Amis 02/18/2019, 3:13 PM

## 2019-02-18 NOTE — Progress Notes (Signed)
Occupational Therapy Session Note  Patient Details  Name: Heather [name] MRN: [id number] Date of Birth: April 16, 1971  Today's Date: 02/18/2019 OT Individual Time: 2563-8937 OT Individual Time Calculation (min): 47 min   Short Term Goals: Week 1:  OT Short Term Goal 1 (Week 1): STG=LTG due to LOS  Skilled Therapeutic Interventions/Progress Updates:    Pt greeted in w/c. She reported feeling too drowsy to ambulate or stand during tx. RN confirmed drowsiness was a side effect of anti-nausea medication give earlier. Therefore, tx focus placed on UB strengthening and Rt NMR while propelling w/c in gift shop and atrium. Min A for navigation through tight spaces. With increased time, she was able to maneuver around obstacles and back up while turning w/c. Pt incorporated R UE while opening/closing candle containers, applying lotion, and holding stuffed animals. R UE reaching with selecting stuffed animals that played music. At end of session pt was returned to room and completed stand pivot<bed with steady assist. She returned to semi reclined position unassisted and was left with all needs within reach and bed alarm set.   Therapy Documentation Precautions:  Precautions Precautions: Fall Restrictions Weight Bearing Restrictions: No Vital Signs: Therapy Vitals Temp: 98.5 F (36.9 C) Temp Source: Oral Pulse Rate: 86 Resp: 20 BP: 115/68 Patient Position (if appropriate): Sitting Oxygen Therapy SpO2: 99 % O2 Device: Room Air Pain: No c/o pain during tx   ADL:     Therapy/Group: Individual Therapy  Skeet Simmer 02/18/2019, 4:47 PM

## 2019-02-18 NOTE — Progress Notes (Signed)
Physical Therapy Session Note  Patient Details  Name: Heather [name] MRN: [id number] Date of Birth: 1971/05/21  Today's Date: 02/18/2019 PT Individual Time: 1000-1058 PT Individual Time Calculation (min): 58 min   Short Term Goals: Week 1:  PT Short Term Goal 1 (Week 1): =LTG due to ELOS  Skilled Therapeutic Interventions/Progress Updates:  Pt was seen bedside in the am. Pt transferred supine to edge of bed with side rail and S with increased time. Pt donned pants, socks and socks at edge of bed with S. Pt transferred sit to stand with min A and transferred stand pivot with min A. Pt propelled w/c about 150 feet with B LEs and S with increased time. Pt performed multiple transfers sit to stand in the gym with c/g to min A with rolling walker. Pt performed step taps and alternating step taps 3 sets x 10 reps each for LE strengthening. During treatment pt c/o lines in visual field, blood sugar checked by nursing was ok. Vitals: BP 121/72, HR 85 and O2 sat 98%. After rest break, pt felt better. Pt ambulated 50 feet with rolling walker and c/g to min A. Pt propelled w/c about 100 feet with B LEs and S with increased time. Pt left in day room with OT for dance group.   Therapy Documentation Precautions:  Precautions Precautions: Fall Restrictions Weight Bearing Restrictions: No General:   Pain: No c/o pain.    Therapy/Group: Individual Therapy  Dub Amis 02/18/2019, 12:13 PM

## 2019-02-18 NOTE — Progress Notes (Signed)
Occupational Therapy Session Note  Patient Details  Name: Heather [name] MRN: [id number] Date of Birth: July 23, 1971  Today's Date: 02/18/2019 OT Group Time: 1100-1200 OT Group Time Calculation (min): 60 min  Short Term Goals: Week 1:  OT Short Term Goal 1 (Week 1): STG=LTG due to LOS  Skilled Therapeutic Interventions/Progress Updates:    Pt engaged in therapeutic w/c level dance group focusing on patient choice, UE/LE strengthening, salience, activity tolerance, and social participation. Pt was guided through various dance-based exercises involving UEs/LEs and trunk. All music was selected by group members. Emphasis placed on Rt NMR and activity tolerance. Pt exhibited high levels of participation in group, requested music, engaged with others on Lt and Rt sides. She initiated rest breaks and actively integrated R UE during bilaterally involved dancing and hand holding. Pt often sang along to familiar favorite music as well. At end of session pt was returned to room via w/c and left with NT.   She c/o nausea during tx. OT provided her with an anti-nausea aromatherapy blend with pt consent. Also notified RN when pt returned to room.    Therapy Documentation Precautions:  Precautions Precautions: Fall Restrictions Weight Bearing Restrictions: No Vital Signs: Therapy Vitals Temp: 98.5 F (36.9 C) Temp Source: Oral Pulse Rate: 86 Resp: 20 BP: 115/68 Patient Position (if appropriate): Sitting Oxygen Therapy SpO2: 99 % O2 Device: Room Air Pain: No s/s pain during tx   ADL:       Therapy/Group: Group Therapy  [name] 02/18/2019, 4:48 PM

## 2019-02-18 NOTE — Progress Notes (Signed)
Heather [name] Progress Note   Subjective: Awake, alert no c/o's.   Objective Vitals:   02/18/19 0500 02/18/19 0511 02/18/19 0735 02/18/19 0752  BP:  131/83 131/82   Pulse:  87 88   Resp:  16 16   Temp:  98.6 F (37 C) 98.6 F (37 C)   TempSrc:  Oral Oral   SpO2:  96% 96%   Weight: 101.8 kg  100.9 kg 100.9 kg  Height:       Physical Exam General: WN,WD, NAD Heart: S1,S2 RRR Lungs: BS slightly decreased in bases otherwise CTAB A/P Abdomen: Active BS, Still slightly tender to palpation.  Extremities: No LE edema  Dialysis Orders: CCPD 7X/week with5 exchanges (6 here given lower vol) overnight,2.5L fill volume (using 2L here d/t side effects) dwelltime 1.5 hrs,no day bag or pause,EDW99kg edw. Typically uses combo of 2.5% and 4.25% fluid. - Mircera IM 50mg  (last given 11/28/18)   Assessment/Plan: 1. PD cath related peritonitis/ culture negative: 1st episode. Cell count-1st peritonitis episode, initial cell count 210 (81% neutrophils), PD fluid Cx 2/8 and 2/13 negative.Has completed 2 wks empiric vanc/ Tressie Ellis 2/8-  2/22.  Will have outpatient PD team review sterile techniques, may be breaking off to use bathroom at night w/o proper reconnecting.  2. Debility:Continue rehab with CIR 3. ESRD:Continue CCPD. Using 2L volume w/ 6 exchanges / 1h 75min dwells at this time d/t SOB with higher volume dwells. Continue use combo of 2.5% and 4.25% fluid.  4. HTN/volume: No edema, BP variable but lower this AM. See above. 5. Anemiaof CKD: Hgb 11.8 s/p 2U PRBCs 2/15. Continue Aranesp weekly. 6.Metabolic Bone Disease: Corr Ca/Phos controlled, nobinders/VDRA.  7. Nutrition:Alb low, continue pro-stat supplement. 8. Hx CVA: management per primary 9. UncontrolledT2DM:Hgb A1C 9.9%,per primary    [facility] 02/18/2019, 1:55 PM  Additional Objective Labs: Basic Metabolic Panel: Recent Labs  Lab 02/13/19 1038 02/15/19 1323  NA 139 136   K 3.2* 3.5  CL 100 97*  CO2 23 25  GLUCOSE 114* 193*  BUN 34* 35*  CREATININE 7.93* 9.45*  CALCIUM 8.3* 8.1*  PHOS 4.7* 6.2*   Liver Function Tests: Recent Labs  Lab 02/13/19 1038 02/15/19 1323  ALBUMIN 2.2* 2.1*   No results for input(s): LIPASE, AMYLASE in the last 168 hours. CBC: Recent Labs  Lab 02/12/19 0621 02/15/19 1323  WBC 9.3 10.2  NEUTROABS  --  7.4  HGB 10.4* 11.8*  HCT 34.6* 37.2  MCV 94.8 95.1  PLT 326 264   Blood Culture    Component Value Date/Time   SDES PERITONEAL DIALYSATE 02/08/2019 1050   SDES PERITONEAL DIALYSATE 02/08/2019 1050   SPECREQUEST NONE 02/08/2019 1050   SPECREQUEST NONE 02/08/2019 1050   CULT  02/08/2019 1050    NO GROWTH 5 DAYS Performed at [facility], [place] [address] [place], [place] [address]    REPTSTATUS 02/13/2019 FINAL 02/08/2019 1050   REPTSTATUS 02/08/2019 FINAL 02/08/2019 1050    Cardiac Enzymes: No results for input(s): CKTOTAL, CKMB, CKMBINDEX, TROPONINI in the last 168 hours. CBG: Recent Labs  Lab 02/17/19 1655 02/17/19 2134 02/18/19 0653 02/18/19 1037 02/18/19 1211  GLUCAP 116* 173* 236* 209* 195*   Iron Studies: No results for input(s): IRON, TIBC, TRANSFERRIN, FERRITIN in the last 72 hours. @lablastinr3 @ Studies/Results: No results found. Medications: . dialysis solution 2.5% low-MG/low-CA    . dialysis solution 4.25% low-MG/low-CA     . carvedilol  6.25 mg Oral BID WC  . darbepoetin (  ARANESP) injection - NON-DIALYSIS  60 mcg Subcutaneous Q Wed-1800  . feeding supplement (PRO-STAT SUGAR FREE 64)  30 mL Oral BID  . gabapentin  100 mg Oral Daily  . gentamicin cream  1 application Topical Daily  . heparin  5,000 Units Subcutaneous Q8H  . insulin aspart  0-15 Units Subcutaneous TID WC  . insulin aspart  3 Units Subcutaneous TID WC  . insulin glargine  20 Units Subcutaneous Q lunch  . lidocaine-prilocaine  1 application Topical UD  . polyethylene glycol  17 g Oral Daily  . senna-docusate   1 tablet Oral BID  . simvastatin  5 mg Oral QHS  . [START ON 02/20/2019] Vitamin D (Ergocalciferol)  50,000 Units Oral Q7 days

## 2019-02-18 NOTE — Progress Notes (Signed)
Heather [name] is a 48 y.o. female 1971-08-21 [id number]  Subjective: Feels generally well. Asks why need to stay on CIR rather than be at home.  Objective: Vital signs in last 24 hours: Temp:  [98 F (36.7 C)-98.6 F (37 C)] 98.6 F (37 C) (02/23 0735) Pulse Rate:  [84-90] 88 (02/23 0735) Resp:  [16-19] 16 (02/23 0735) BP: (131-162)/(73-83) 131/82 (02/23 0735) SpO2:  [96 %-100 %] 96 % (02/23 0735) Weight:  [100.9 kg-102.8 kg] 100.9 kg (02/23 0752) Weight change: -0.1 kg Last BM Date: 02/16/19  Intake/Output from previous day: 02/22 0701 - 02/23 0700 In: 12791 [P.O.:680; IV Piggyback:100] Out: 1876   Physical Exam General: No apparent distress    Lungs: Normal effort. Lungs clear to auscultation, no crackles or wheezes. Cardiovascular: Regular rate and rhythm, no edema   Lab Results: BMET    Component Value Date/Time   NA 136 02/15/2019 1323   NA 137 05/09/2018   K 3.5 02/15/2019 1323   K 4.3 10/16/2013   CL 97 (L) 02/15/2019 1323   CL 100 10/16/2013   CO2 25 02/15/2019 1323   GLUCOSE 193 (H) 02/15/2019 1323   BUN 35 (H) 02/15/2019 1323   BUN 36 (A) 05/09/2018   CREATININE 9.45 (H) 02/15/2019 1323   CREATININE 3.05 (H) 09/03/2015 0855   CALCIUM 8.1 (L) 02/15/2019 1323   CALCIUM 8.9 10/16/2013   GFRNONAA 4 (L) 02/15/2019 1323   GFRNONAA 18 (L) 09/03/2015 0855   GFRAA 5 (L) 02/15/2019 1323   GFRAA 20 (L) 09/03/2015 0855   CBC    Component Value Date/Time   WBC 10.2 02/15/2019 1323   RBC 3.91 02/15/2019 1323   HGB 11.8 (L) 02/15/2019 1323   HCT 37.2 02/15/2019 1323   PLT 264 02/15/2019 1323   MCV 95.1 02/15/2019 1323   MCH 30.2 02/15/2019 1323   MCHC 31.7 02/15/2019 1323   RDW 16.4 (H) 02/15/2019 1323   LYMPHSABS 1.9 02/15/2019 1323   MONOABS 0.7 02/15/2019 1323   EOSABS 0.2 02/15/2019 1323   BASOSABS 0.0 02/15/2019 1323   CBG's (last 3):   Recent Labs    02/17/19 1655 02/17/19 2134 02/18/19 0653  GLUCAP 116* 173* 236*   LFT's Lab  Results  Component Value Date   ALT 31 02/07/2019   AST 31 02/07/2019   ALKPHOS 106 02/07/2019   BILITOT 0.6 02/07/2019    Studies/Results: No results found.  Medications:  I have reviewed the patient's current medications. Scheduled Medications: . carvedilol  6.25 mg Oral BID WC  . darbepoetin (ARANESP) injection - NON-DIALYSIS  60 mcg Subcutaneous Q Wed-1800  . feeding supplement (PRO-STAT SUGAR FREE 64)  30 mL Oral BID  . gabapentin  100 mg Oral Daily  . gentamicin cream  1 application Topical Daily  . heparin  5,000 Units Subcutaneous Q8H  . insulin aspart  0-15 Units Subcutaneous TID WC  . insulin aspart  3 Units Subcutaneous TID WC  . insulin glargine  20 Units Subcutaneous Q lunch  . lidocaine-prilocaine  1 application Topical UD  . polyethylene glycol  17 g Oral Daily  . senna-docusate  1 tablet Oral BID  . simvastatin  5 mg Oral QHS  . [START ON 02/20/2019] Vitamin D (Ergocalciferol)  50,000 Units Oral Q7 days   PRN Medications: acetaminophen, aluminum hydroxide, bisacodyl, diphenhydrAMINE, diphenhydrAMINE, diphenhydrAMINE-zinc acetate, guaiFENesin-dextromethorphan, dianeal solution for CAPD/CCPD with heparin, dianeal solution for CAPD/CCPD with heparin, lubiprostone, ondansetron (ZOFRAN) IV, polyethylene glycol, promethazine, sodium phosphate, traMADol, traZODone  Assessment/Plan:  Active Problems:   Debility   Pruritus   Neuropathic pain   Renovascular hypertension   Anemia of chronic disease   Diabetes mellitus type 2 in obese (HCC)   Labile blood glucose   ESRD on dialysis ([place])   Bacterial peritonitis ([place])   Labile blood pressure   Non-intractable vomiting   1. Debility following PD cath induced peritonitis - continue CIR therapies and support as scheduled with renal mgmt of medical conditions as ongoing. Note patient would like DC home sooner this week thank initially planned if medically stable for same (ie, arrangements for IV abx and duration of  treatment for PD related peritonitis unclear) 2. ESRD on CCPD - per renal including volume and electrolytes + Anemia CD 3. DM2, uncontrolled and labile - decreased basal insulin due to hypoglycemia 2/18 - continue SSI and Lantus 4. Neuropathy - gabapentin as rx'd 5. BV - complete Flagyl as rx'd - nausea side effects of abx has resolved  Length of stay, days: 5  Valerie A. Asa Lente, MD 02/18/2019, 9:24 AM

## 2019-02-19 ENCOUNTER — Inpatient Hospital Stay (HOSPITAL_COMMUNITY): Payer: Managed Care, Other (non HMO) | Admitting: Occupational Therapy

## 2019-02-19 ENCOUNTER — Inpatient Hospital Stay (HOSPITAL_COMMUNITY): Payer: Managed Care, Other (non HMO)

## 2019-02-19 LAB — GLUCOSE, CAPILLARY
GLUCOSE-CAPILLARY: 109 mg/dL — AB (ref 70–99)
Glucose-Capillary: 271 mg/dL — ABNORMAL HIGH (ref 70–99)
Glucose-Capillary: 136 mg/dL — ABNORMAL HIGH (ref 70–99)
Glucose-Capillary: 284 mg/dL — ABNORMAL HIGH (ref 70–99)

## 2019-02-19 MED ORDER — LANTHANUM CARBONATE 500 MG PO CHEW
1000.0000 mg | CHEWABLE_TABLET | Freq: Three times a day (TID) | ORAL | Status: DC
  Administered 2019-02-22 – 2019-02-23 (×4): 1000 mg via ORAL
  Filled 2019-02-19 (×16): qty 2

## 2019-02-19 MED ORDER — DELFLEX-LC/2.5% DEXTROSE 394 MOSM/L IP SOLN: INTRAPERITONEAL | Status: DC

## 2019-02-19 MED ORDER — GENTAMICIN SULFATE 0.1 % EX CREA: 1.0000 "application " | TOPICAL_CREAM | Freq: Every day | CUTANEOUS | Status: DC

## 2019-02-19 MED ORDER — HEPARIN 1000 UNIT/ML FOR PERITONEAL DIALYSIS: 500.0000 [IU] | INTRAMUSCULAR | Status: DC | PRN

## 2019-02-19 MED ORDER — RENA-VITE PO TABS
1.0000 | ORAL_TABLET | Freq: Every day | ORAL | Status: DC
  Filled 2019-02-19 (×2): qty 1

## 2019-02-19 MED ORDER — INSULIN GLARGINE 100 UNIT/ML ~~LOC~~ SOLN
23.0000 [IU] | Freq: Every day | SUBCUTANEOUS | Status: DC
  Administered 2019-02-19 – 2019-02-23 (×5): 23 [IU] via SUBCUTANEOUS
  Filled 2019-02-19 (×6): qty 0.23

## 2019-02-19 NOTE — Progress Notes (Signed)
Patient hasn't has bowel movement in 72 hours. Miralax and senna  offered per  Schedule but refused. Education provided patient understands and continues to refuse.   Adria Devon, LPN

## 2019-02-19 NOTE — Progress Notes (Signed)
This patient refused all of her 2200 medications, educated and explain meds,,states that she does not want any medications in her system today. Consumed moderate amount of food brought in by family member., Continue PD treatment w/o complications

## 2019-02-19 NOTE — Progress Notes (Addendum)
Physical Therapy Session Note  Patient Details  Name: Heather [name] MRN: [id number] Date of Birth: 1971/04/22  Today's Date: 02/19/2019 PT Individual Time: 1300-1400 PT Individual Time Calculation (min): 60 min   Short Term Goals: Week 1:  PT Short Term Goal 1 (Week 1): =LTG due to ELOS  Skilled Therapeutic Interventions/Progress Updates:   Pt resting in bed.  In flat bed, no rails, rolling L with supervision from hook lying position.  R side lying> sitting with min cues, min assist due to sternum pain.  Stand pivot transfers throughout session with CGA with RW.  Seated Therapeutic exercise performed with LEs to increase strength for functional mobility: 15 x 1 each bil heel raises, toe raises, bil hip adduction.  Seated BP 137/80, HR91.  After standing on wedge, in sitting 126/74, HR 89. C/o dizziness, which resolved in about 2 minutes.  W/c propulsion using bil UEs, x 60' with supervision.  Cues for gaze stabilization as pt became dizzy with turns of w/c.  Sustained stretch and balance challenge, standing with forefeet on wedge, with bil UE support x 2 minutes.  No c/o dizziness. Pt able to balance briefly with RUE on chest, but unable to tolerate attempting 0UE support.  Wearing bil posterior leaf spring AFOS, gait training with RW over level tile, x 150' with CGA.  Min VCS for gaze stabilization during ambulation.  No c/o dizziness or vertigo.PT urged pt to bring in other tennis shoes that are not as soft as present ones she is wearing, for optimal AFO function.  Seated VOR exs after set-up, horizontal and vertical, with 1 cue to begin.  Pt had mild vertigo but no nausea.  Pt left resting in w/c with needs at hand..     Therapy Documentation Precautions:  Precautions Precautions: Fall Restrictions Weight Bearing Restrictions: No   Pain: Pain Assessment Pain Scale: 0-10 Pain Score: 0-No pain   Therapy/Group: Individual Therapy  Raneshia Derick 02/19/2019, 4:38 PM

## 2019-02-19 NOTE — Progress Notes (Signed)
Occupational Therapy Session Note  Patient Details  Name: NEVAYA NAGELE MRN: [id number] Date of Birth: 19-Feb-1971  Today's Date: 02/19/2019 OT Individual Time: 2179-8102 OT Individual Time Calculation (min): 58 min   Short Term Goals: Week 1:  OT Short Term Goal 1 (Week 1): STG=LTG due to LOS  Skilled Therapeutic Interventions/Progress Updates:    Pt greeted in w/c, declining participation in self care tasks. Toileting needs met. Agreeable to tx. Worked on UB strengthening by having pt self propel to dayroom. Transitioned focus to standing balance/endurance and maintaining stable vitals while engaging in meaningful leisure task. BP while seated 143/90. Multiple sit<stands at elevated table completed with supervision assist. Pt able to stand while completing a jigsaw puzzle for 2 minute windows. BP after 1st and 2nd stands: 117/81 and 123/73 respectively. Pt began c/o dizziness after 2nd stand. Cued her to visually fixate on an item in room when dizziness started. Pt then able to stand for 4 minutes 40 seconds! BP after 128/83. Though BP was higher, pt still frustrated that she is not in her "normal range" with systolic >548. Reports refusing BP medication. Educated pt on health risks of HTN including MI and CVA. Encouraged her to discuss BP concerns with RN and MD. She verbalized understanding. Pt was then escorted to room via w/c and completed stand step transfer<bed with steady assist. Pt returned to bed. She was left in sidelying position with all needs within reach.   She also stated that aromatherapy blend from yesterday "really helped" with absolving nausea   Therapy Documentation Precautions:  Precautions Precautions: Fall Restrictions Weight Bearing Restrictions: No Vital Signs: Therapy Vitals Temp: 99 F (37.2 C) Temp Source: Oral Pulse Rate: 92 Resp: 18 BP: 139/79 Patient Position (if appropriate): Sitting Oxygen Therapy SpO2: 100 % O2 Device: Room Air Pain: No c/o  pain during tx Pain Assessment Pain Scale: 0-10 Pain Score: 0-No pain ADL:       Therapy/Group: Individual Therapy  Danielle Mink A Halden Phegley 02/19/2019, 4:04 PM

## 2019-02-19 NOTE — Plan of Care (Signed)
  Problem: Consults Goal: RH GENERAL PATIENT EDUCATION Description See Patient Education module for education specifics. Outcome: Progressing   Problem: RH SKIN INTEGRITY Goal: RH STG SKIN FREE OF INFECTION/BREAKDOWN Description No new breakdown with min assist   Outcome: Progressing Goal: RH STG ABLE TO PERFORM INCISION/WOUND CARE W/ASSISTANCE Description STG Able To Perform Incision/Wound Care With [employer].  Outcome: Progressing   Problem: RH PAIN MANAGEMENT Goal: RH STG PAIN MANAGED AT OR BELOW PT'S PAIN GOAL Description < 3 out of 10.   Outcome: Progressing   Problem: RH BOWEL ELIMINATION Goal: RH STG MANAGE BOWEL WITH ASSISTANCE Description STG Manage Bowel with min Assistance.  Outcome: Progressing Goal: RH STG MANAGE BOWEL W/MEDICATION W/ASSISTANCE Description STG Manage Bowel with Medication with min Assistance.  Outcome: Progressing

## 2019-02-19 NOTE — Progress Notes (Signed)
Resting monitor verbalize discomfort of feeling dizzy after receiving Phenergan earlier during day shift reassessed VS , denies N/V, monitor, continue PD and tolerating well, consumed small amount [id number] diet,

## 2019-02-19 NOTE — Progress Notes (Signed)
Subjective: Interval History: has no complaint , exchanges going well.  Getting stronger.  Objective: Vital signs in last 24 hours: Temp:  [97.5 F (36.4 C)-98.5 F (36.9 C)] 98 F (36.7 C) (02/24 0700) Pulse Rate:  [86-90] 86 (02/24 0700) Resp:  [15-20] 15 (02/24 0700) BP: (96-120)/(59-75) 119/75 (02/24 0700) SpO2:  [98 %-100 %] 100 % (02/24 0700) Weight:  [100.6 kg-102.9 kg] 100.6 kg (02/24 0700) Weight change: -0.3 kg  Intake/Output from previous day: 02/23 0701 - 02/24 0700 In: 12491 [P.O.:480] Out: 2258  Intake/Output this shift: Total I/O In: 12251 [P.O.:240; Other:12011] Out: -   General appearance: alert, cooperative, no distress and moderately obese Resp: diminished breath sounds bilaterally Cardio: S1, S2 normal and systolic murmur: systolic ejection 2/6, crescendo and decrescendo at 2nd left intercostal space GI: obese, pos bs, PD ES L mid abdm pos FW Extremities: extremities normal, atraumatic, no cyanosis or edema  Lab Results: No results for input(s): WBC, HGB, HCT, PLT in the last 72 hours. BMET: No results for input(s): NA, K, CL, CO2, GLUCOSE, BUN, CREATININE, CALCIUM in the last 72 hours. No results for input(s): PTH in the last 72 hours. Iron Studies: No results for input(s): IRON, TIBC, TRANSFERRIN, FERRITIN in the last 72 hours.  Studies/Results: No results found.  I have reviewed the patient's current medications.  Assessment/Plan: 1 ESRD will do PD hs, 2.5%.  Going well, needs some better clearance 2 HTN stop coreg 3 Anemia esa 4 HPTH , ^ binders. 5 Obesity 6 Peritonitis resolved P PD, esa, ^ binders.    LOS: 6 days   Heather [name] 02/19/2019,9:40 AM

## 2019-02-19 NOTE — Progress Notes (Signed)
Occupational Therapy Session Note  Patient Details  Name: Heather [name] MRN: [id number] Date of Birth: Jun 18, 1971  Today's Date: 02/19/2019 OT Individual Time: 0929-5747 OT Individual Time Calculation (min): 70 min    Short Term Goals: Week 1:  OT Short Term Goal 1 (Week 1): STG=LTG due to LOS  Skilled Therapeutic Interventions/Progress Updates:    Pt seen for OT session focusing on ADL re-training and upright tolerance. Pt in supine upon arrival, denying pain and agreeable to tx session.  She ambulated within room with CGA using RW. Gathered clothing items from drawers, using walker bag to assist with carrying items. She dressed seated on EOB, Indep with UB, CGA when standing to pull up pants.  She ambulated to sink and complete oral hygiene standing at sink, one UE for support on sink ledge at all times. Pt tolerating ~3-4 minutes in standing before requiring seated rest break.  She ambulated 14ft in hallway towards therapy gym before requiring seated rest break. Following break, ambulated 171ft remainder of way to therapy gym. Ambulation with min A using RW.  Completed x3 sit>stand from elevated EOM without UE support with steadying assist. Pt with increased complaints of dizziness/light headedness, denied "spinning" sensation. BP in sitting 130/79, standing 91/64. RN,MD, and PA made aware, instructed to don TED hose.  Pt declined practiced vestibular exercises this session, Reviewed exercises for pt to perform when feeling better.  Pt requesting to return to room at end of session, transitioned to side-lying. Left with all needs in reach and bed alarm on.   Therapy Documentation Precautions:  Precautions Precautions: Fall Restrictions Weight Bearing Restrictions: No Pain:   No/denies pain   Therapy/Group: Individual Therapy  Donnelle Rubey L 02/19/2019, 6:59 AM

## 2019-02-19 NOTE — Progress Notes (Signed)
[facility] PHYSICAL MEDICINE & REHABILITATION PROGRESS NOTE  Subjective/Complaints: Patient seen laying in bed this morning.  She states she slept well overnight.  She states she had a good weekend.  She states her nausea is improved.  She has questions regarding FMLA.  ROS: Denies CP, shortness of breath, nausea, vomiting, diarrhea.  Objective: Vital Signs: Blood pressure 119/75, pulse 86, temperature 98 F (36.7 C), temperature source Oral, resp. rate 15, height 5\' 9"  (1.753 m), weight 100.6 kg, SpO2 100 %. No results found. No results for input(s): WBC, HGB, HCT, PLT in the last 72 hours. No results for input(s): NA, K, CL, CO2, GLUCOSE, BUN, CREATININE, CALCIUM in the last 72 hours.  Physical Exam: BP 119/75 (BP Location: Right Arm)   Pulse 86   Temp 98 F (36.7 C) (Oral)   Resp 15   Ht 5\' 9"  (1.753 m)   Wt 100.6 kg   SpO2 100%   BMI 32.75 kg/m  Constitutional: No distress . Vital signs reviewed.  Obese. HENT: Normocephalic.  Atraumatic. Eyes: EOMI. No discharge. Cardiovascular: RRR.  No JVD.  + Murmur. Respiratory: CTA bilaterally.  Normal effort. GI: BS +. Non-distended.  + PD cath Musc: No edema or tenderness in extremities. Neurological: Alert. Motor: Grossly 4/5 throughout, improving Skin: She isnot diaphoretic.  Psychiatric: She has anormal mood and affect. Herbehavior is normal.Judgmentand thought contentnormal.   Assessment/Plan: 1. Functional deficits secondary to debility which require 3+ hours per day of interdisciplinary therapy in a comprehensive inpatient rehab setting.  Physiatrist is providing close team supervision and 24 hour management of active medical problems listed below.  Physiatrist and rehab team continue to assess barriers to discharge/monitor patient progress toward functional and medical goals  Care Tool:  Bathing    Body parts bathed by patient: Right arm, Left upper leg, Left arm, Right lower leg, Chest, Left lower leg,  Abdomen, Front perineal area, Face, Buttocks, Right upper leg         Bathing assist Assist Level: Supervision/Verbal cueing     Upper Body Dressing/Undressing Upper body dressing   What is the patient wearing?: Pull over shirt    Upper body assist Assist Level: Set up assist    Lower Body Dressing/Undressing Lower body dressing      What is the patient wearing?: Underwear/pull up, Pants     Lower body assist Assist for lower body dressing: Supervision/Verbal cueing     Toileting Toileting    Toileting assist Assist for toileting: Minimal Assistance - Patient > 75%     Transfers Chair/bed transfer  Transfers assist  Chair/bed transfer activity did not occur: N/A  Chair/bed transfer assist level: Contact Guard/Touching assist     Locomotion Ambulation   Ambulation assist      Assist level: Contact Guard/Touching assist Assistive device: Walker-rolling Max distance: 75   Walk 10 feet activity   Assist     Assist level: Contact Guard/Touching assist Assistive device: Walker-rolling   Walk 50 feet activity   Assist Walk 50 feet with 2 turns activity did not occur: Safety/medical concerns  Assist level: Contact Guard/Touching assist Assistive device: Walker-rolling    Walk 150 feet activity   Assist Walk 150 feet activity did not occur: Safety/medical concerns         Walk 10 feet on uneven surface  activity   Assist Walk 10 feet on uneven surfaces activity did not occur: Safety/medical concerns         Wheelchair     Assist  Will patient use wheelchair at discharge?: No Type of Wheelchair: Manual    Wheelchair assist level: Supervision/Verbal cueing Max wheelchair distance: 150    Wheelchair 50 feet with 2 turns activity    Assist        Assist Level: Supervision/Verbal cueing   Wheelchair 150 feet activity     Assist     Assist Level: Supervision/Verbal cueing      Medical Problem List and  Plan: 1.Functional declinesecondary todebilityafter PD relarted peritonitis/sepsis, pt with 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 DVT Prophylaxis/Anticoagulation: Pharmaceutical:Heparin 3.Chronic back pain/Pain Management:No baclofen or muscle relaxers per Nephrology. ContinueTramadol as needed 4. Mood:LCSW to follow for evaluation and support 5. Neuropsych: This patientiscapable of making decisions on herown behalf. 6. Skin/Wound Care:Routine pressure relief measures. 7. Fluids/Electrolytes/Nutrition:Monitor I's and O's.  Filed Weights   02/18/19 0752 02/19/19 0438 02/19/19 0700  Weight: 100.9 kg 102.9 kg 100.6 kg   Stable on 2/24 8. ESRD:Continue PD in the evenings. Nephrology to follow for PDassistance/management and correction of electrolyte abnormalities.[name]  9.PD associated peritonitis: Completed course of vancomycin/Fortazon 2/22.   10.T2DM:   Lantus(on Tresiba at [employer] with meal coverage, increased to 23 on 2/24  Sliding scale insulin for tighter blood sugar control.   NovoLog 3 units 3 times daily started on 2/21  Labile and elevated on 2/21  Monitor with increased mobility 11.Chronic constipation: Encourage compliance with bowel program.  12. Anemia of chronic disease:On Arnaesp weekly. Continue to monitor with serial checks.   Hemoglobin 11.8 on 2/20  Continue to monitor 13. Neuropathy: Gabapentin changed to 100 mg daily on 2/19 14. Bacterial vaginosis: Completed course of Flagyl on 2/21 15. HTN: Monitor BP bid. On coreg bid.  Relatively controlled on 2/24  Monitor with increased mobility 16. Persistent Hypokalemia: Recs per nephro 17.  Pruritus  Benadryl cream ordered on 2/19  Resolved 18.  Nausea  Suspect related to multiple antibiotics, will monitor as antibiotics and completed  Encouraged PRN medications  Improved   LOS: 6  days A FACE TO FACE EVALUATION WAS PERFORMED  Heather [name] 02/19/2019, 8:54 AM

## 2019-02-20 ENCOUNTER — Inpatient Hospital Stay (HOSPITAL_COMMUNITY): Payer: Managed Care, Other (non HMO)

## 2019-02-20 ENCOUNTER — Inpatient Hospital Stay (HOSPITAL_COMMUNITY): Payer: Managed Care, Other (non HMO) | Admitting: Occupational Therapy

## 2019-02-20 ENCOUNTER — Encounter (HOSPITAL_COMMUNITY): Payer: Managed Care, Other (non HMO) | Admitting: Psychology

## 2019-02-20 LAB — GLUCOSE, CAPILLARY
GLUCOSE-CAPILLARY: 247 mg/dL — AB (ref 70–99)
GLUCOSE-CAPILLARY: 216 mg/dL — AB (ref 70–99)
Glucose-Capillary: 264 mg/dL — ABNORMAL HIGH (ref 70–99)
Glucose-Capillary: 88 mg/dL (ref 70–99)
Glucose-Capillary: 156 mg/dL — ABNORMAL HIGH (ref 70–99)

## 2019-02-20 MED ORDER — DELFLEX-LC/1.5% DEXTROSE 344 MOSM/L IP SOLN: INTRAPERITONEAL | Status: DC

## 2019-02-20 MED ORDER — GENTAMICIN SULFATE 0.1 % EX CREA
1.0000 "application " | TOPICAL_CREAM | Freq: Every day | CUTANEOUS | Status: DC
  Filled 2019-02-20: qty 15

## 2019-02-20 MED ORDER — HEPARIN 1000 UNIT/ML FOR PERITONEAL DIALYSIS
INTRAPERITONEAL | Status: DC | PRN
  Filled 2019-02-20: qty 3000

## 2019-02-20 MED ORDER — HEPARIN 1000 UNIT/ML FOR PERITONEAL DIALYSIS: 500.0000 [IU] | INTRAMUSCULAR | Status: DC | PRN

## 2019-02-20 NOTE — Progress Notes (Addendum)
[facility] Progress Note   Subjective:   Patient seen in room. Feels full from daytime dwell. Some dizziness with soft BP this AM. Nausea resolved. No other concerns.  Objective Vitals:   02/19/19 1825 02/19/19 2006 02/20/19 0252 02/20/19 0650  BP: 130/72 138/79 (!) 105/59 109/73  Pulse: 94 92 84 89  Resp: 18 18  18   Temp: 99.5 F (37.5 C) 98.4 F (36.9 C)  98.3 F (36.8 C)  TempSrc: Axillary Oral  Oral  SpO2: 99% 97%  97%  Weight: 99.6 kg   101.3 kg  Height:       Physical Exam General: Well developed, obese female in NAD Heart: RRR, normal N8/M7. + systolic murmur Lungs: Respirations even and unlabored, CTA bilaterally without wheezing, rhonchi or rales Abdomen: Soft, non-tender, non-distended. Normoactive BSpos FW Extremities: No edema or ischemic changes noted b/l lower extremities Dialysis Access: PD cath L abdomen without drainage or erythema  Additional Objective Labs: Basic Metabolic Panel: Recent Labs  Lab 02/13/19 1038 02/15/19 1323  NA 139 136  K 3.2* 3.5  CL 100 97*  CO2 23 25  GLUCOSE 114* 193*  BUN 34* 35*  CREATININE 7.93* 9.45*  CALCIUM 8.3* 8.1*  PHOS 4.7* 6.2*   Liver Function Tests: Recent Labs  Lab 02/13/19 1038 02/15/19 1323  ALBUMIN 2.2* 2.1*   CBC: Recent Labs  Lab 02/15/19 1323  WBC 10.2  NEUTROABS 7.4  HGB 11.8*  HCT 37.2  MCV 95.1  PLT 264   Blood Culture    Component Value Date/Time   SDES PERITONEAL DIALYSATE 02/08/2019 [place] 02/08/2019 1050   SPECREQUEST NONE 02/08/2019 1050   SPECREQUEST NONE 02/08/2019 1050   CULT  02/08/2019 1050    NO GROWTH 5 DAYS Performed at [facility], [place] [address] [place], [place] [address]    REPTSTATUS 02/13/2019 FINAL 02/08/2019 1050   REPTSTATUS 02/08/2019 FINAL 02/08/2019 1050    CBG: Recent Labs  Lab 02/19/19 0621 02/19/19 1146 02/19/19 1649 02/19/19 2119 02/20/19 0652  GLUCAP 271* 136* 109* 284* 264*    Medications:  . darbepoetin (ARANESP) injection - NON-DIALYSIS  60 mcg Subcutaneous Q Wed-1800  . feeding supplement (PRO-STAT SUGAR FREE 64)  30 mL Oral BID  . gabapentin  100 mg Oral Daily  . gentamicin cream  1 application Topical Daily  . heparin  5,000 Units Subcutaneous Q8H  . insulin aspart  0-15 Units Subcutaneous TID WC  . insulin aspart  3 Units Subcutaneous TID WC  . insulin glargine  23 Units Subcutaneous Q lunch  . lanthanum  1,000 mg Oral TID WC  . lidocaine-prilocaine  1 application Topical UD  . multivitamin  1 tablet Oral QHS  . polyethylene glycol  17 g Oral Daily  . senna-docusate  1 tablet Oral BID  . simvastatin  5 mg Oral QHS    Dialysis Orders: CCPD 7X/week with5 exchanges (6 here given lower vol) over24 h because of need for ^ Clearance,2.5L fill volume (using 2L here d/t side effects) dwelltime 1.5 hrs,no day bag or pause,EDW99kg edw. Typically uses combo of 2.5% and 4.25% fluid. - Mircera IM 50mg  (last given 11/28/18)  Assessment/Plan: 1. PD cath related peritonitis: Cell count-1st peritonitis episode, initial cell count 210 (81% neutrophils), PD fluid Cx 2/8 and 2/13 negative.Has completed 2 wks empiric vanc/ Tressie Ellis 2/8-  2/22. Symptoms now resolved. 2. ESRD: Continue CCPD. Currently using 2L x 6 exchanges/ 1.5 hour dwell time with 2L day dwell,  2.5%. Using dwell and pause for clearance 3 BPUse all 1.5% instead of 2.5%  4. Anemia:  Last hemoglobin 11.8 s/p 2U PRBC 2/15. Continue weekly Aranesp. Will recheck CBC.  5. Secondary hyperparathyroidism:  Last Ca 8.1, corrected 9.6. Last phos elevated, fosrenol dose adjusted yesterday. No VDRA, 6. Nutrition:  Albumin low, continue pro-stat supplement 7. Hx of CVA: management per primary 8. Debility: Continue rehab with CIR 9. Uncontrolled T2DM: Last Hgb A1C 9.9%, continue management per primary.   Anice Paganini, PA-C 02/20/2019, 8:24 AM  [facility] Pager: [phone] I have seen  and examined this patient and agree with the plan of care  Seen , eval, examined, discussed with patient, PA .  Mauricia Area 02/20/2019, 9:31 AM

## 2019-02-20 NOTE — Progress Notes (Signed)
Patient refused all medications during shift. Noted with episode of  Dizziness and diaphoresis. No bowel movement noted in 72 hours continues to refuse all stool softner. PA informed. Adria Devon, LPN

## 2019-02-20 NOTE — Consult Note (Signed)
Neuropsychological Consultation   Patient:   Heather [name]   DOB:   [birth year]  MR Number:  [id number]  Location:  [facility] [address] [facility] [address] [place] [id number] [place] [address] Dept: [place]: [phone]           Date of Service:   02/20/2019  Start Time:   2 PM End Time:   3 PM  Provider/Observer:  Ilean Skill, Psy.D.       Clinical Neuropsychologist       Billing Code/Service: [phone]  Chief Complaint:    Corley Kohls is a 48 year old female with history of diabetes with retinopathy, neuropathy and gastroparesis, B12 deficiency, chronich back pain/fibromyalgia, right cerebellar CVA on 11/2017, ESRD on PD since 10/2018.  Patient admitted on 02/05/2019 with complaints of lower abdominal pain worse with PD as well as nausea and vomiting.  Found to have bacterial peritonitis and started on IV antibiotics and IV pain meds.  Patient arrested on 02/06/2019 requiring CODE Blue and CPR to resuscitate past dose of IV Dilaudid.  Responded to Narcan.  Patient has developed Debility due to long hospital course.  Multiple long-term chronic medical issues on top of acute infection.    Reason for Service:  [id number] K Trenda Moots is a 48 year old female withhistory of T2DM that retinopathy, neuropathy and gastroparesis, B12 deficiency,chronic back pain/fibromyalgia,Right cerebellarCVA12/2018, ESRD- on PD since 10/2018; who was admitted on 02/05/2019 with complaints of lower abdominal pain worse with PD as well as nausea and vomiting. She was found to have bacterial peritonitis and started on IV antibiotics as well as IV pain meds. Wet prep positive for bacterial vaginosis and she was started on Flagyl but this was DC'd due to ongoing issues with nausea. Pelvic ultrasound revealed 2.8 cm left ovarian cyst--needs follow-up ultrasound in 6 to 12 weeks. On 2/11,she arrested requiring CODE BLUE and CPR  to resuscitate past dose of IV Dilaudid. She responded to Narcan and EKG without acute findings. Abdominal pain improving and nausea and vomiting has resolved. PD fluid now grossly clear and cultures negative so far.   Anemia of chronic disease to improve with 2 units PRBC.She reported increase in abdominal pain with SOB on 2/17 therefore V/Q scan done and showed very low probability for PE. CXR done and was negative for pulmonary edema but patient with rales RLL and question of SOB due to fluid overload.May needCT abdomen if pain continues to be ongoing.Constipation has resolved.Therapy ongoing and patient with significant balance deficits as well as orthostatic changes due to debility. CIR recommended for follow-up therapy  Current Status:  Patient denies significant anxiety or depression but that she has some memory loss of events around her most acute illness.  Inability to remember does create some distress.     Behavioral Observation: ASRA GAMBREL  presents as a 48 y.o.-year-old Right African American Female who appeared her stated age. her dress was Appropriate and she was Well Groomed and her manners were Appropriate to the situation.  her participation was indicative of Appropriate and Redirectable behaviors.  There were any physical disabilities noted.  she displayed an appropriate level of cooperation and motivation.     Interactions:    Active Appropriate and Redirectable  Attention:   abnormal and attention span appeared shorter than expected for age  Memory:   abnormal; remote memory intact, recent memory impaired  Visuo-spatial:  not examined  Speech (Volume):  low  Speech:  normal; normal  Thought Process:  Coherent and Relevant  Though Content:  WNL; not suicidal and not homicidal  Orientation:   person, place and time/date  Judgment:   Fair  Planning:   Poor  Affect:    Flat and  Lethargic  Mood:    Dysphoric  Insight:   Fair  Intelligence:   normal  Medical History:   Past Medical History:  Diagnosis Date  . Antiplatelet or antithrombotic long-term use: Plavix 06/30/2017  . B12 deficiency 05/10/2014  . Blood transfusion without reported diagnosis   . Chronic constipation   . CVA (cerebral vascular accident) ([place]), nonhemorrhagic, inferior right cerebellum 12/03/2017  . Cyst of left ovary 01/12/2018  . Diabetic neuropathy, painful ([place]), on low dose Gabapentin 07/13/2013  . DM (diabetes mellitus), type 2, uncontrolled, with renal complications ([place]) 16/09/9603  . DM2 (diabetes mellitus, type 2) ([place])   . Dysfunction of left eustachian tube, with pusatile tinnitus 11/24/2017  . Dyslipidemia associated with type 2 diabetes mellitus ([place]) 06/25/2016  . ESRD (end stage renal disease) on dialysis [facility])    On hemodialysis in July 2019 via [facility] then switched to [facility] in Nov 2019.   [name] ESRD with anemia ([place])   . Fibromyalgia   . Gastroparesis due to DM   . GERD (gastroesophageal reflux disease)   . Hypertension associated with diabetes ([place]) 02/19/2011  . IBS (irritable bowel syndrome)   . Left-sided weakness 12/10/2017   .  [name] Leiomyoma of uterus   . Pancreatitis   . Proliferative diabetic retinopathy ([place]) 09/29/2015  . 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 Reactive depression 12/10/2017   .  [name] Right-sided low back pain without sciatica 12/08/2015  . Secondary hyperthyroidism 11/27/2013  . Steal syndrome dialysis vascular access ([place]) 02/17/2018  . Thromboembolism ([place]) 04/05/2018  . Upper airway cough syndrome, with recs to stay off ACE and take Pepcid q hs 11/15/2016     Psychiatric History:  Patient denies past history of psychiatric issues.  Family Med/Psych History:  Family History  Problem Relation Age of Onset  . Colon polyps Mother   . Diabetes Mother   . Heart murmur Father        history essentially unknown  . Hypertension Father   .  Diabetes Sister   . Kidney failure Sister        kidney transplant  . Diabetes Brother   . Retinal degeneration Brother   . Heart murmur Son   . Stroke Paternal Grandmother   . Diabetes Sister     Impression/DX:  Jamariya Davidoff is a 48 year old female with history of diabetes with retinopathy, neuropathy and gastroparesis, B12 deficiency, chronich back pain/fibromyalgia, right cerebellar CVA on 11/2017, ESRD on PD since 10/2018.  Patient admitted on 02/05/2019 with complaints of lower abdominal pain worse with PD as well as nausea and vomiting.  Found to have bacterial peritonitis and started on IV antibiotics and IV pain meds.  Patient arrested on 02/06/2019 requiring CODE Blue and CPR to resuscitate past dose of IV Dilaudid.  Responded to Narcan.  Patient has developed Debility due to long hospital course.  Multiple long-term chronic medical issues on top of acute infection.    Patient denies significant anxiety or depression but that she has some memory loss of events around her most acute illness.  Inability to remember does create some distress.  Disposition/Plan:  Worked on issues of coping and adjustment to extended hospital course due to debility and dealing with  chronic medical issues.  Diagnosis:    Debility        Electronically Signed   _______________________ Ilean Skill, Psy.D.

## 2019-02-20 NOTE — Progress Notes (Signed)
Occupational Therapy Session Note  Patient Details  Name: Heather [name] MRN: [id number] Date of Birth: [birth year]  Today's Date: 02/20/2019 OT Individual Time: 1300-1355 OT Individual Time Calculation (min): 55 min    Short Term Goals: Week 1:  OT Short Term Goal 1 (Week 1): STG=LTG due to LOS  Skilled Therapeutic Interventions/Progress Updates:    OT intervenion with focus on bed mobility, sitting balance, sit<>stand, standing balance.  Pt resting in bed upon arrival and agreeable to "give it a try." Pt reported being nauseous and dizzy early in the day.  Pt sat EOB with report of some dizziness  BP sitting:124/83, standing: 116/80.  Pt stated she felt shaky when standing with CGA. Pt engaged in sitting tasks with weighted ball and hitting ball back to therapist with 2# bar. Attempted to complete same tasks while standing but pt reported feeling "shaky" and returned to EOB.  Pt performed sit<>stand X 5. Pt returned to supine and remained in bed with bed alarm activated and needs within reach.   Therapy Documentation Precautions:  Precautions Precautions: Fall Restrictions Weight Bearing Restrictions: No   Pain: Pt denies pain this afternoon  Therapy/Group: Individual Therapy  Leroy Libman 02/20/2019, 2:36 PM

## 2019-02-20 NOTE — Plan of Care (Signed)
  Problem: Consults Goal: RH GENERAL PATIENT EDUCATION Description See Patient Education module for education specifics. Outcome: Progressing   Problem: RH SKIN INTEGRITY Goal: RH STG SKIN FREE OF INFECTION/BREAKDOWN Description No new breakdown with min assist   Outcome: Progressing Goal: RH STG ABLE TO PERFORM INCISION/WOUND CARE W/ASSISTANCE Description STG Able To Perform Incision/Wound Care With [employer].  Outcome: Progressing   Problem: RH PAIN MANAGEMENT Goal: RH STG PAIN MANAGED AT OR BELOW PT'S PAIN GOAL Description < 3 out of 10.   Outcome: Progressing   Problem: RH BOWEL ELIMINATION Goal: RH STG MANAGE BOWEL WITH ASSISTANCE Description STG Manage Bowel with min Assistance.  Outcome: Progressing Goal: RH STG MANAGE BOWEL W/MEDICATION W/ASSISTANCE Description STG Manage Bowel with Medication with min Assistance.  Outcome: Progressing

## 2019-02-20 NOTE — Progress Notes (Signed)
[facility] PHYSICAL MEDICINE & REHABILITATION PROGRESS NOTE  Subjective/Complaints: Laying in bed this morning.  She states she did not sleep well overnight, she cannot identify a reason why.  ROS: Denies CP, shortness of breath, nausea, vomiting, diarrhea.  Objective: Vital Signs: Blood pressure 109/73, pulse 89, temperature 98.3 F (36.8 C), temperature source Oral, resp. rate 18, height 5\' 9"  (1.753 m), weight 101.3 kg, SpO2 97 %. No results found. No results for input(s): WBC, HGB, HCT, PLT in the last 72 hours. No results for input(s): NA, K, CL, CO2, GLUCOSE, BUN, CREATININE, CALCIUM in the last 72 hours.  Physical Exam: BP 109/73 (BP Location: Left Arm)   Pulse 89   Temp 98.3 F (36.8 C) (Oral)   Resp 18   Ht 5\' 9"  (1.753 m)   Wt 101.3 kg   SpO2 97%   BMI 32.98 kg/m  Constitutional: No distress . Vital signs reviewed.  Obese. HENT: Normocephalic.  Atraumatic. Eyes: EOMI. No discharge. Cardiovascular: RRR.  No JVD.  + Murmur. Respiratory: CTA bilaterally.  Normal effort. GI: BS +. Non-distended.  + PD cath Musc: No edema or tenderness in extremities. Neurological: Alert. Motor: Grossly 4/5 throughout, improving Skin: She isnot diaphoretic.  Psychiatric: She has anormal mood and affect. Herbehavior is normal.Judgmentand thought contentnormal.   Assessment/Plan: 1. Functional deficits secondary to debility which require 3+ hours per day of interdisciplinary therapy in a comprehensive inpatient rehab setting.  Physiatrist is providing close team supervision and 24 hour management of active medical problems listed below.  Physiatrist and rehab team continue to assess barriers to discharge/monitor patient progress toward functional and medical goals  Care Tool:  Bathing  Bathing activity did not occur: Refused Body parts bathed by patient: Right arm, Left upper leg, Left arm, Right lower leg, Chest, Left lower leg, Abdomen, Front perineal area, Face, Buttocks,  Right upper leg         Bathing assist Assist Level: Supervision/Verbal cueing     Upper Body Dressing/Undressing Upper body dressing   What is the patient wearing?: Pull over shirt    Upper body assist Assist Level: Independent    Lower Body Dressing/Undressing Lower body dressing      What is the patient wearing?: Underwear/pull up, Pants     Lower body assist Assist for lower body dressing: Supervision/Verbal cueing     Toileting Toileting    Toileting assist Assist for toileting: Minimal Assistance - Patient > 75%     Transfers Chair/bed transfer  Transfers assist  Chair/bed transfer activity did not occur: N/A  Chair/bed transfer assist level: Contact Guard/Touching assist     Locomotion Ambulation   Ambulation assist      Assist level: Contact Guard/Touching assist Assistive device: Walker-rolling Max distance: 150   Walk 10 feet activity   Assist     Assist level: Contact Guard/Touching assist Assistive device: Orthosis, Walker-rolling   Walk 50 feet activity   Assist Walk 50 feet with 2 turns activity did not occur: Safety/medical concerns  Assist level: Contact Guard/Touching assist Assistive device: Walker-rolling, Orthosis    Walk 150 feet activity   Assist Walk 150 feet activity did not occur: Safety/medical concerns  Assist level: Contact Guard/Touching assist Assistive device: Walker-rolling, Orthosis    Walk 10 feet on uneven surface  activity   Assist Walk 10 feet on uneven surfaces activity did not occur: Safety/medical concerns         Wheelchair     Assist Will patient use wheelchair at discharge?:  No Type of Wheelchair: Manual    Wheelchair assist level: Supervision/Verbal cueing Max wheelchair distance: 60    Wheelchair 50 feet with 2 turns activity    Assist        Assist Level: Supervision/Verbal cueing   Wheelchair 150 feet activity     Assist     Assist Level:  Supervision/Verbal cueing      Medical Problem List and Plan: 1.Functional declinesecondary todebilityafter PD relarted peritonitis/sepsis, 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 DVT Prophylaxis/Anticoagulation: Pharmaceutical:Heparin 3.Chronic back pain/Pain Management:No baclofen or muscle relaxers per Nephrology. ContinueTramadol as needed 4. Mood:LCSW to follow for evaluation and support 5. Neuropsych: This patientiscapable of making decisions on herown behalf. 6. Skin/Wound Care:Routine pressure relief measures. 7. Fluids/Electrolytes/Nutrition:Monitor I's and O's.  Filed Weights   02/19/19 0700 02/19/19 1825 02/20/19 0650  Weight: 100.6 kg 99.6 kg 101.3 kg   Stable on 2/25 8. ESRD:Continue PD in the evenings. Nephrology to follow for PDassistance/management and correction of electrolyte abnormalities.[name]  9.PD associated peritonitis: Completed course of vancomycin/Fortazon 2/22.   10.T2DM:   Lantus(on Tresiba at [employer] with meal coverage, increased to 23 on 2/24  Sliding scale insulin for tighter blood sugar control.   NovoLog 3 units 3 times daily started on 2/21  Labile and elevated on 2/25  Monitor with increased mobility 11.Chronic constipation: Encourage compliance with bowel program.  12. Anemia of chronic disease:On Arnaesp weekly. Continue to monitor with serial checks.   Hemoglobin 11.8 on 2/20  Continue to monitor 13. Neuropathy: Gabapentin changed to 100 mg daily on 2/19 14. Bacterial vaginosis: Completed course of Flagyl on 2/21 15. HTN: Monitor BP bid. On coreg bid.  Labile on 2/25  Monitor with increased mobility 16. Persistent Hypokalemia: Recs per nephro 17.  Pruritus  Benadryl cream ordered on 2/19  Resolved 18.  Nausea  Suspect related to multiple antibiotics, will monitor as antibiotics and completed  Encouraged PRN  medications  Improved   LOS: 7 days A FACE TO FACE EVALUATION WAS PERFORMED   Lorie Phenix 02/20/2019, 8:51 AM

## 2019-02-20 NOTE — Progress Notes (Signed)
Physical Therapy Session Note  Patient Details  Name: Heather [name] MRN: [id number] Date of Birth: [birth year]  Today's Date: 02/20/2019 PT Individual Time: 1055-1110 PT Individual Time Calculation (min): 15 min, missed 45 minutes  Short Term Goals: Week 1:  PT Short Term Goal 1 (Week 1): =LTG due to ELOS  Skilled Therapeutic Interventions/Progress Updates:    Patient in bed L sidelying, reports still getting dizzy, but more due to orthostatic hypotension as still getting lot of fluid taken off during PD.  Reports nephrologist this morning talked about changing parameters on PD to take off less fluid.  Patient also reports did not sleep well due to RN in all night to check machine.  Rolled to R with increased time and cues, use of rail, side to sit S increased time and use of rail.  Seated EOB several minutes then stood to ambulate to door about 12' min guard A with RW.  C/o symptomatic hypotension with BP measured as below.  Patient requesting to cancel rest of session due to low BP and fatigue from not sleeping well.  Assisted to bed in w/c and pt transferred to bed min A.  Left with bed alarm on and needs/call bell in reach.  Therapy Documentation Precautions:  Precautions Precautions: Fall Restrictions Weight Bearing Restrictions: No General: PT Amount of Missed Time (min): 45 Minutes PT Missed Treatment Reason: Patient fatigue Vital Signs:   seated BP 115/68; standing 106/58, HR 92 Pain: Pain Assessment Faces Pain Scale: Hurts a little bit Pain Type: Acute pain Pain Location: Chest Pain Descriptors / Indicators: Sore Pain Onset: With Activity Pain Intervention(s): Rest    Therapy/Group: Individual Therapy  Reginia Naas  [place], PT 02/20/2019, 12:00 PM

## 2019-02-20 NOTE — Progress Notes (Signed)
PD system alarming and Hemodialysis RN notified but is unable to intervene at this time , instructed 'to call the I-800 # to assist with trouble shutting issues".Engineer, technical sales notified on call personnel and spoke with representative, who assisted with resolving concerns. 0203 am  Hemodialysis RN was renotified and updated on resoling alarm issues, She   arrived on department to speak with patient. Patient continue to refuse meds ( laxative or stool softener) at this time. PD system functioning well and  Patient  monitor.Call bell with in reach., no complaint

## 2019-02-20 NOTE — Progress Notes (Signed)
Occupational Therapy Session Note  Patient Details  Name: Heather [name] MRN: [id number] Date of Birth: May 25, 1971  Today's Date: 02/20/2019 OT Individual Time: 8264-1583 OT Individual Time Calculation (min): 55 min  and Today's Date: 02/20/2019 OT Missed Time: 20 Minutes Missed Time Reason: Patient ill (comment)(Nausea)   Short Term Goals: Week 1:  OT Short Term Goal 1 (Week 1): STG=LTG due to LOS  Skilled Therapeutic Interventions/Progress Updates:    Pt seen for OT ADL bathing/dressing session. Pt in supine upon arrival, voiced having had a rough night with poor sleep and generalized dizziness throughout which cont throughout session. Pt limited by fatigue and dizziness throughout ADL session.  She ambulated with CGA using RW to gather clothing items. Completed bathing/dressing routine from seated level at sink with set-up and distant supervision, rest breaks required throughout. She stood with support of sink to complete dynamic standing tasks to pull pants up. She declined standing to complete oral care 2/2 fatigue and dizziness. Upon standing to pull pants up, pt becoming diaphoretic with increased dizziness, BP in sitting with TED hose donned, 83/54. Stood to pull pants BP in standing 96/80. RN made aware of pt's symptoms, checked blood sugar, WNL.  Pt returned to bed with min A. Left in side-lying for comfort with all needs in reach and bed alarm on.  Education/discussion throughout session regarding OT/PT goals and d/c planning. Pt motivated to gain independence, however, ready to be back home.   Therapy Documentation Precautions:  Precautions Precautions: Fall Restrictions Weight Bearing Restrictions: No   Therapy/Group: Individual Therapy  Cristela Stalder L 02/20/2019, 7:02 AM

## 2019-02-21 ENCOUNTER — Inpatient Hospital Stay (HOSPITAL_COMMUNITY): Payer: Managed Care, Other (non HMO) | Admitting: Occupational Therapy

## 2019-02-21 ENCOUNTER — Inpatient Hospital Stay (HOSPITAL_COMMUNITY): Payer: Managed Care, Other (non HMO)

## 2019-02-21 DIAGNOSIS — D72829 Elevated white blood cell count, unspecified: Secondary | ICD-10-CM

## 2019-02-21 LAB — CBC WITH DIFFERENTIAL/PLATELET
Abs Immature Granulocytes: 0.06 10*3/uL (ref 0.00–0.07)
Basophils Absolute: 0 10*3/uL (ref 0.0–0.1)
Basophils Relative: 0 %
Eosinophils Absolute: 0.1 10*3/uL (ref 0.0–0.5)
Eosinophils Relative: 1 %
HEMATOCRIT: 35.4 % — AB (ref 36.0–46.0)
Hemoglobin: 10.7 g/dL — ABNORMAL LOW (ref 12.0–15.0)
Immature Granulocytes: 1 %
Lymphocytes Relative: 22 %
Lymphs Abs: 2.5 10*3/uL (ref 0.7–4.0)
MCH: 28.8 pg (ref 26.0–34.0)
MCHC: 30.2 g/dL (ref 30.0–36.0)
MCV: 95.4 fL (ref 80.0–100.0)
Monocytes Absolute: 1.1 10*3/uL — ABNORMAL HIGH (ref 0.1–1.0)
Monocytes Relative: 9 %
Neutro Abs: 7.7 10*3/uL (ref 1.7–7.7)
Neutrophils Relative %: 67 %
Platelets: 259 10*3/uL (ref 150–400)
RBC: 3.71 MIL/uL — ABNORMAL LOW (ref 3.87–5.11)
RDW: 15.7 % — ABNORMAL HIGH (ref 11.5–15.5)
WBC: 11.6 10*3/uL — ABNORMAL HIGH (ref 4.0–10.5)
nRBC: 0 % (ref 0.0–0.2)

## 2019-02-21 LAB — RENAL FUNCTION PANEL
ALBUMIN: 2.2 g/dL — AB (ref 3.5–5.0)
Anion gap: 14 (ref 5–15)
BUN: 35 mg/dL — ABNORMAL HIGH (ref 6–20)
CALCIUM: 8 mg/dL — AB (ref 8.9–10.3)
CO2: 25 mmol/L (ref 22–32)
CREATININE: 11.11 mg/dL — AB (ref 0.44–1.00)
Chloride: 97 mmol/L — ABNORMAL LOW (ref 98–111)
GFR calc non Af Amer: 4 mL/min — ABNORMAL LOW (ref >60)
GFR, EST AFRICAN AMERICAN: 4 mL/min — AB (ref >60)
Glucose, Bld: 142 mg/dL — ABNORMAL HIGH (ref 70–99)
Phosphorus: 7.5 mg/dL — ABNORMAL HIGH (ref 2.5–4.6)
Potassium: 3.1 mmol/L — ABNORMAL LOW (ref 3.5–5.1)
Sodium: 136 mmol/L (ref 135–145)

## 2019-02-21 LAB — GLUCOSE, CAPILLARY
GLUCOSE-CAPILLARY: 187 mg/dL — AB (ref 70–99)
Glucose-Capillary: 133 mg/dL — ABNORMAL HIGH (ref 70–99)
Glucose-Capillary: 132 mg/dL — ABNORMAL HIGH (ref 70–99)
Glucose-Capillary: 70 mg/dL (ref 70–99)
Glucose-Capillary: 113 mg/dL — ABNORMAL HIGH (ref 70–99)

## 2019-02-21 MED ORDER — DELFLEX-LC/1.5% DEXTROSE 344 MOSM/L IP SOLN: INTRAPERITONEAL | Status: DC

## 2019-02-21 MED ORDER — HEPARIN 1000 UNIT/ML FOR PERITONEAL DIALYSIS: 500.0000 [IU] | INTRAMUSCULAR | Status: DC | PRN

## 2019-02-21 MED ORDER — GENTAMICIN SULFATE 0.1 % EX CREA: 1.0000 "application " | TOPICAL_CREAM | Freq: Every day | CUTANEOUS | Status: DC

## 2019-02-21 MED ORDER — HEPARIN 1000 UNIT/ML FOR PERITONEAL DIALYSIS
INTRAPERITONEAL | Status: DC | PRN
  Filled 2019-02-21: qty 5000

## 2019-02-21 MED ORDER — POTASSIUM CHLORIDE CRYS ER 20 MEQ PO TBCR
40.0000 meq | EXTENDED_RELEASE_TABLET | Freq: Once | ORAL | Status: AC
  Administered 2019-02-21: 40 meq via ORAL
  Filled 2019-02-21: qty 2

## 2019-02-21 NOTE — Progress Notes (Signed)
Occupational Therapy Session Note  Patient Details  Name: Heather [name] MRN: [id number] Date of Birth: 1971/11/03  Today's Date: 02/21/2019 OT Individual Time: 1368-5992 OT Individual Time Calculation (min): 60 min    Short Term Goals: Week 1:  OT Short Term Goal 1 (Week 1): STG=LTG due to LOS  Skilled Therapeutic Interventions/Progress Updates:    Pt received in bed stating she was feeling dizzy and still not feeling great from vomiting earlier. She sat to EOB but felt dizzy. Pt requested to bathe from bed so she could lay back. Set up with bathing. Pt feeling better so sat up to don clothing.  Pt able to transfer to w/c with CGA.  Pt needed to move slowly with lots of breaks today to keep her dizziness to a minimum.  Encouraged pt to work on eye exercises provided to her by the PT. Pt resting in w/c with quick release belt on and all needs met.    Therapy Documentation Precautions:  Precautions Precautions: Fall Restrictions Weight Bearing Restrictions: No    Vital Signs: Therapy Vitals Temp: 98.6 F (37 C) Temp Source: Oral Pulse Rate: 86 Resp: 18 BP: 116/74 Patient Position (if appropriate): Sitting Oxygen Therapy SpO2: 99 % O2 Device: Room Air Pain: Pain Assessment Pain Scale: 0-10 Pain Score: 0-No pain   Therapy/Group: Individual Therapy  [place] 02/21/2019, 9:46 AM

## 2019-02-21 NOTE — Progress Notes (Signed)
Social Work Patient ID: Heather [name], female   DOB: 12-07-1971, 48 y.o.   MRN: [id number]  Met with pt to inform team conference goals supervision level and discharge date 2/29. Pt is glad to have a date and feels she will be ready by then. Will need B-AFO's referral made today will see in am. Has all equipment already and will check with team on OP versus home health. Work toward discharge Sat.

## 2019-02-21 NOTE — Progress Notes (Signed)
Orthopedic Tech Progress Note Patient Details:  Heather [name] 31-Oct-1971 [id number]  Patient ID: [name], female   DOB: 06-24-1971, 48 y.o.   MRN: [id number]   [name] 02/21/2019, 5:47 PMCalled [facility] for bilateral AFO braces.

## 2019-02-21 NOTE — Progress Notes (Addendum)
Physical Therapy Session Note  Patient Details  Name: Heather [name] MRN: [id number] Date of Birth: [birth year]  Today's Date: 02/21/2019 PT Individual Time: 1300-1400  And 1435- 1500 PT Individual Time Calculation (min): 60 and 25 min   Short Term Goals: Week 1:  PT Short Term Goal 1 (Week 1): =LTG due to ELOS  Skilled Therapeutic Interventions/Progress Updates:  tx 1:  Pt sitting up in w/c.  She has not eaten anything today.  PT provided pt with yogurt which she finished.  Pt reported that she habitually does not eat breakfast or lunch.  She usually had P butter crackers at her desk at work, for a snack.    Pt c/o dizziness with head movements.  Seated BP = 119/74, HR 86.  Pt propelled w/c x 50 ' for activity tolerance, superviison.    Pt educated pt on self stretch hamstrings and heel cords seated using a foot stool and gait belt for strap.  Pt performed after 1 demo, x 30 seconds x 3 R/LLE; also self -stretched heel cords in sitting targeting soleus muscles.   Gait training up/down curb/step with RW, min assist.  Pt left resting in w/c with needs at hand.  PT re-iterated VOR exs several times per day, x 30-60 seconds for horizontal and vertical head movements.  Pt performing these af PT left room.  tx 2:  Pt seated in w/c, stated that her BP was higher, and she felt better.  Standing balance, on compliant Airex pad with bil UE support, x 2 minutes with L><R wt shifting, min guard assist, no LOB .  Pt c/o dizziness at end of 2 minutes.  Advanced gait training including kicking Yoga block with alternating feet, x 30 ', min assist.  Pt left resting in w/c with needs at hand.     Therapy Documentation Precautions:  Precautions Precautions: Fall Restrictions Weight Bearing Restrictions: No    Pain: none per pt, tx 1 and tx 2     Therapy/Group: Individual Therapy  Paarth Cropper 02/21/2019, 5:20 PM

## 2019-02-21 NOTE — Progress Notes (Signed)
Subjective: Interval History: has no complaint , exchanges going well. Not dizzy.  Objective: Vital signs in last 24 hours: Temp:  [97.6 F (36.4 C)-98.5 F (36.9 C)] 98 F (36.7 C) (02/26 0441) Pulse Rate:  [84-91] 87 (02/26 0441) Resp:  [12-20] 15 (02/26 0441) BP: (106-121)/(63-73) 109/70 (02/26 0441) SpO2:  [96 %-99 %] 98 % (02/26 0441) Weight:  [99.7 kg-102.1 kg] 99.7 kg (02/26 0441) Weight change: 2.5 kg  Intake/Output from previous day: 02/25 0701 - 02/26 0700 In: 390 [P.O.:390] Out: 3 [Urine:1; Emesis/NG output:1; Stool:1] Intake/Output this shift: No intake/output data recorded.  General appearance: alert, cooperative, no distress and moderately obese Resp: clear to auscultation bilaterally Cardio: S1, S2 normal and systolic murmur: systolic ejection 2/6, crescendo and decrescendo at 2nd left intercostal space GI: obese, pos bs,soft, pos FW, PD cath L mid abdm Extremities: extremities normal, atraumatic, no cyanosis or edema  Lab Results: Recent Labs    02/21/19 0523  WBC 11.6*  HGB 10.7*  HCT 35.4*  PLT 259   BMET:  Recent Labs    02/21/19 0523  NA 136  K 3.1*  CL 97*  CO2 25  GLUCOSE 142*  BUN 35*  CREATININE 11.11*  CALCIUM 8.0*   No results for input(s): PTH in the last 72 hours. Iron Studies: No results for input(s): IRON, TIBC, TRANSFERRIN, FERRITIN in the last 72 hours.  Studies/Results: No results found.  I have reviewed the patient's current medications.  Assessment/Plan: 1 ESRD PD going well. Will need clearance study done outpatient 2 Anemia 3 HPTH vit D 4 DM controlled 5 Obesity 6 Debill per rehab P PD, 1.5, esa, vit D, do clearance outpatient   LOS: 8 days   Jeneen Rinks Tenise Stetler 02/21/2019,7:38 AM

## 2019-02-21 NOTE — Patient Care Conference (Signed)
Inpatient RehabilitationTeam Conference and Plan of Care Update Date: 02/21/2019   Time: 2:00 PM    Patient Name: Heather [name]      Medical Record Number: [id number]  Date of Birth: 04-30-1971 Sex: Female         Room/Bed: 4M06C/4M06C-01 Payor Info: Payor: CIG[employer] / Plan: CIGNA MANAGED / Product Type: *No Product type* /    Admitting Diagnosis: peritonitis  Admit Date/Time:  02/13/2019  6:04 PM Admission Comments: No comment available   Primary Diagnosis:  <principal problem not specified> Principal Problem: <principal problem not specified>  Patient Active Problem List   Diagnosis Date Noted  . Leukocytosis   . Non-intractable vomiting   . Labile blood pressure   . Pruritus   . Neuropathic pain   . Renovascular hypertension   . Anemia of chronic disease   . Diabetes mellitus type 2 in obese ([place])   . Labile blood glucose   . ESRD on dialysis ([place])   . Bacterial peritonitis ([place])   . Debility 02/13/2019  . Peritonitis, dialysis-associated, initial encounter ([place]) 02/05/2019  . Peritonitis, dialysis-associated ([place]) 02/05/2019  . Left ovarian cyst 02/05/2019  . Hemiplegia and hemiparesis following cerebral infarction affecting left non-dominant side ([place]) 12/29/2018  . Peritoneal dialysis catheter in place [facility]) 11/14/2018  . Pre-transplant evaluation for kidney transplant 11/07/2018  . ESRD with anemia ([place])   . ESRD (end stage renal disease) on dialysis ([place])   . Grade I diastolic dysfunction 22/29/7989  . Arteriovenous fistula ([place]) 07/21/2018  . Type 2 diabetes mellitus with end-stage renal disease ([place]) 07/21/2018  . History of cerebrovascular accident (CVA) involving cerebellum 07/21/2018  . Cyst of left ovary 01/12/2018  . Dysmenorrhea 01/12/2018  . Insomnia 01/12/2018  . Menorrhagia 01/12/2018  . Vitamin D deficiency 01/02/2018  . Bilateral lower extremity edema 01/02/2018  . Reactive depression 12/10/2017  . Arthralgia of right temporomandibular joint  10/24/2017  . Antiplatelet or antithrombotic long-term use: Plavix 06/30/2017  . Morbid obesity due to excess calories ([place]) 11/16/2016  . Upper airway cough syndrome 11/15/2016  . DM (diabetes mellitus), type 2, uncontrolled, with renal complications ([place]) 21/19/4174  . Dyslipidemia associated with type 2 diabetes mellitus ([place]) 06/25/2016  . Uncontrolled type 2 diabetes mellitus with both eyes affected by proliferative retinopathy and macular edema, with long-term current use of insulin ([place]) 06/22/2016  . Uncontrolled type 2 diabetes mellitus with diabetic polyneuropathy, with long-term current use of insulin ([place]) 03/08/2016  . Proliferative diabetic retinopathy ([place]) 09/29/2015  . Constipation 09/03/2015  . Pseudophakia of both eyes 05/30/2015  . 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 B12 deficiency 05/10/2014  . Anemia associated with chronic renal failure, Procrit 04/26/2014  . CKD (chronic kidney disease) stage 5, GFR less than 15 ml/min ([facility]) 04/26/2014  . Secondary hyperparathyroidism ([place]) 11/27/2013  . Posterior subcapsular cataract, bilateral 10/18/2013  . Diabetic neuropathy, painful ([place]), on low dose Gabapentin 07/13/2013  . Diabetes mellitus, type II, insulin dependent ([place]), on CGM, with end organ damage: renal, neuropathy, gastroparesis, retinopathy 02/19/2011  . Hypertension associated with diabetes ([place]) 02/19/2011  . Gastroparesis due to DM ([place]) 02/19/2011    Expected Discharge Date: Expected Discharge Date: 02/24/19  Team Members Present: Physician leading conference: Dr. Delice Lesch Social Worker Present: Ovidio Kin, LCSW Nurse Present: Dorien Chihuahua, RN PT Present: Georjean Mode, PT SLP Present: Windell Moulding, SLP PPS Coordinator present : Gunnar Fusi     Current Status/Progress Goal Weekly Team Focus  Medical   Functional decline secondary  to debility after PD relarted peritonitis/sepsis, pt with history of right cerebellar infarct with  residual right limb/truncal ataxia and balance deficits  Improve mobility, BP, leukocytosis  See above   Bowel/Bladder   Continent of Bladder/Bowel LBM 02/20/19  Maintan contienence  Assess and montior toileting needs if no BM within 2-3 days iniate po/prn bowel profra   Swallow/Nutrition/ Hydration             ADL's   Supervision/set-up bathing/dressing, supervision toileting, supervision transfers;lots of rest breaks and increased time required for all tasks  Mod I overall  ADL re-training, functional activity tolerance, vestibular, d/c planning   Mobility   min assist stand pivot tr, CGA gait x 150' at best ( on 2/25, x 12' only, due to hypotension)  MOd I transfers and gait, S up/down 4 steps 2 rails  needs order for bil AFOs, nmr, activity tolerance, mobility and locomotion, DME, balance   Communication             Safety/Cognition/ Behavioral Observations            Pain   no pain   (P) < 0  (P) Currently on PD x 7 days,    Skin    no skin issues PD siite unremarkable dressing intact  No sign of infection  QS assessmentand pr moitoring      *See Care Plan and progress notes for long and short-term goals.     Barriers to Discharge  Current Status/Progress Possible Resolutions Date Resolved   Physician    Medical stability;Other (comments)  PD  See above  Therapies, follow vitals, follow labs      Nursing                  PT                    OT                  SLP                SW                Discharge Planning/Teaching Needs:  HOme with husband who works, has mom and siblings who will come by while he is gone. Pt limited by dizziness and vestibular issues      Team Discussion:  Goals mod/i level has been limited due to dizziness and vertigo. Working on vestibular issues and exercises. Dietary consult for renal diet education. Order for B-AFO's. MD adjusting meds and itching resolved. Mom and husband to be with at home.   Revisions to Treatment Plan:  DC  2/29    Continued Need for Acute Rehabilitation Level of Care: The patient requires daily medical management by a physician with specialized training in physical medicine and rehabilitation for the following conditions: Daily direction of a multidisciplinary physical rehabilitation program to ensure safe treatment while eliciting the highest outcome that is of practical value to the patient.: Yes Daily medical management of patient stability for increased activity during participation in an intensive rehabilitation regime.: Yes Daily analysis of laboratory values and/or radiology reports with any subsequent need for medication adjustment of medical intervention for : Diabetes problems;Blood pressure problems;Renal problems;Other   I attest that I was present, lead the team conference, and concur with the assessment and plan of the team.   Elease Hashimoto 02/21/2019, 4:06 PM

## 2019-02-21 NOTE — Progress Notes (Signed)
[place] PHYSICAL MEDICINE & REHABILITATION PROGRESS NOTE  Subjective/Complaints: Patient seen lying in bed this morning.  She states she did not sleep well overnight due to nausea and vomiting, but that improved this morning.  ROS: Denies CP, shortness of breath, nausea, vomiting, diarrhea.  Objective: Vital Signs: Blood pressure 132/71, pulse 86, temperature 98.6 F (37 C), temperature source Oral, resp. rate 18, height 5\' 9"  (1.753 m), weight 99.7 kg, SpO2 99 %. No results found. Recent Labs    02/21/19 0523  WBC 11.6*  HGB 10.7*  HCT 35.4*  PLT 259   Recent Labs    02/21/19 0523  NA 136  K 3.1*  CL 97*  CO2 25  GLUCOSE 142*  BUN 35*  CREATININE 11.11*  CALCIUM 8.0*    Physical Exam: BP 132/71 (BP Location: Right Arm)   Pulse 86   Temp 98.6 F (37 C) (Oral)   Resp 18   Ht 5\' 9"  (1.753 m)   Wt 99.7 kg   SpO2 99%   BMI 32.46 kg/m  Constitutional: No distress . Vital signs reviewed.  Obese. HENT: Normocephalic.  Atraumatic. Eyes: EOMI. No discharge. Cardiovascular: RRR.  No JVD.  + Murmur. Respiratory: CTA bilaterally.  Normal effort. GI: BS +. Non-distended.  + PD cath Musc: No edema or tenderness in extremities. Neurological: Alert. Motor: Grossly 4+/5 throughout Skin: She isnot diaphoretic.  Psychiatric: She has anormal mood and affect. Herbehavior is normal.Judgmentand thought contentnormal.   Assessment/Plan: 1. Functional deficits secondary to debility which require 3+ hours per day of interdisciplinary therapy in a comprehensive inpatient rehab setting.  Physiatrist is providing close team supervision and 24 hour management of active medical problems listed below.  Physiatrist and rehab team continue to assess barriers to discharge/monitor patient progress toward functional and medical goals  Care Tool:  Bathing  Bathing activity did not occur: Refused Body parts bathed by patient: Right arm, Left upper leg, Left arm, Right lower leg,  Chest, Left lower leg, Abdomen, Front perineal area, Face, Buttocks, Right upper leg         Bathing assist Assist Level: Supervision/Verbal cueing     Upper Body Dressing/Undressing Upper body dressing   What is the patient wearing?: Pull over shirt    Upper body assist Assist Level: Independent    Lower Body Dressing/Undressing Lower body dressing      What is the patient wearing?: Underwear/pull up, Pants     Lower body assist Assist for lower body dressing: Supervision/Verbal cueing     Toileting Toileting    Toileting assist Assist for toileting: Minimal Assistance - Patient > 75%     Transfers Chair/bed transfer  Transfers assist  Chair/bed transfer activity did not occur: N/A  Chair/bed transfer assist level: Minimal Assistance - Patient > 75%     Locomotion Ambulation   Ambulation assist      Assist level: Contact Guard/Touching assist Assistive device: Walker-rolling Max distance: 12'   Walk 10 feet activity   Assist     Assist level: Contact Guard/Touching assist Assistive device: Walker-rolling   Walk 50 feet activity   Assist Walk 50 feet with 2 turns activity did not occur: Safety/medical concerns  Assist level: Contact Guard/Touching assist Assistive device: Walker-rolling, Orthosis    Walk 150 feet activity   Assist Walk 150 feet activity did not occur: Safety/medical concerns  Assist level: Contact Guard/Touching assist Assistive device: Walker-rolling, Orthosis    Walk 10 feet on uneven surface  activity   Assist Walk  10 feet on uneven surfaces activity did not occur: Safety/medical concerns         Wheelchair     Assist Will patient use wheelchair at discharge?: No Type of Wheelchair: Manual    Wheelchair assist level: Supervision/Verbal cueing Max wheelchair distance: 60    Wheelchair 50 feet with 2 turns activity    Assist        Assist Level: Supervision/Verbal cueing   Wheelchair 150  feet activity     Assist     Assist Level: Supervision/Verbal cueing      Medical Problem List and Plan: 1.Functional declinesecondary todebilityafter PD relarted peritonitis/sepsis, 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  2. DVT Prophylaxis/Anticoagulation: Pharmaceutical:Heparin 3.Chronic back pain/Pain Management:No baclofen or muscle relaxers per Nephrology. ContinueTramadol as needed 4. Mood:LCSW to follow for evaluation and support 5. Neuropsych: This patientiscapable of making decisions on herown behalf. 6. Skin/Wound Care:Routine pressure relief measures. 7. Fluids/Electrolytes/Nutrition:Monitor I's and O's.  Filed Weights   02/20/19 0650 02/20/19 1630 02/21/19 0441  Weight: 101.3 kg 102.1 kg 99.7 kg   Stable on 2/26 8. ESRD:Continue PD in the evenings. Nephrology to follow for PDassistance/management and correction of electrolyte abnormalities.[name]  9.PD associated peritonitis: Completed course of vancomycin/Fortazon 2/22.   10.T2DM:   Lantus(on Tresiba at [employer] with meal coverage, increased to 23 on 2/24  Sliding scale insulin for tighter blood sugar control.   NovoLog 3 units 3 times daily started on 2/21  Labile, but?  Improving on 2/26  Monitor with increased mobility 11.Chronic constipation: Encourage compliance with bowel program.  12. Anemia of chronic disease:On Arnaesp weekly. Continue to monitor with serial checks.   Hemoglobin 10.7 on 2/26  Continue to monitor 13. Neuropathy: Gabapentin changed to 100 mg daily on 2/19 14. Bacterial vaginosis: Completed course of Flagyl on 2/21 15. HTN: Monitor BP bid. On coreg bid.  Control on 2/26  Monitor with increased  mobility 16. Persistent Hypokalemia: Recs per nephro 17.  Pruritus  Benadryl cream ordered on 2/19  Resolved 18.  Nausea  Suspect related to multiple antibiotics + PD  Encouraged PRN medications  Improved overall 19.  Leukocytosis  WBCs 11.6 on 2/26  Lower labs for the end of the week due to risk of incomplete infection treatment  Afebrile  Continue to monitor   LOS: 8 days A FACE TO FACE EVALUATION WAS PERFORMED  Heather [name] Heather [name] 02/21/2019, 8:55 AM

## 2019-02-21 NOTE — Progress Notes (Signed)
Occupational Therapy Session Note  Patient Details  Name: MALKA BOCEK MRN: [id number] Date of Birth: [birth year]  Today's Date: 02/21/2019 OT Individual Time: 0630-1601 OT Individual Time Calculation (min): 68 min    Short Term Goals: Week 1:  OT Short Term Goal 1 (Week 1): STG=LTG due to LOS  Skilled Therapeutic Interventions/Progress Updates:    Pt seen for OT session focusing on ADL re-training, emotional well-being and functional ambulatin. Pt sitting up in w/c upon arrival, denying pain and nausea at rest and willing to participate in therapy as able. TED hose donned total A. Pt able to don B shoes and AFOs with set-up and increased time. BP assessed prior to mobility.  BP in sitting: 116/72 BP in standing: 91/70  She was taken off unit in w/c total A for time and energy conservation. Change of scenery/environment to lift spirits. Pt ambulated outside x2 trials ~6ft each, turning once. Completed with close supervision and seated rest break btwn trials. Extensive education/discussion while outside regarding energy conservation techniques, return to activity, activity progression, OT/PT goals and CLOF, and d/c planning. Pt seeing improvements in her balance and activity tolerance.  Taken back to unit in w/c. Completed UE strengthening exercises seated EOM using #4 weighted ball. x10 each of overhead press, chest press, and diagonal up on R/L.   Pt returned to w/c and taken back to room. Pt left seated in w/c with all needs in reach.     Therapy Documentation Precautions:  Precautions Precautions: Fall Restrictions Weight Bearing Restrictions: No   Therapy/Group: Individual Therapy  Letita Prentiss L 02/21/2019, 7:13 AM

## 2019-02-21 NOTE — Progress Notes (Signed)
Resting most of shift , reported to nurse that she had a" dizzy spell " and vomited undigested food x 1.Husband at bedside, Patient refused Phenergan states" it make me feel bad", po ice chips provided with relief. Patient continue to refuse all of her 8  pm and 10 pm medication, educated on meds and states she was not taking them". Continue PD and tolerating this well w/o noted complaints or discomfort. Closely monitor and assisted with personal and general care. Call bell within reach, bed alarm in easy reach. Monitor  6:10AM Hemodialysis notified regarding completion of PD

## 2019-02-22 ENCOUNTER — Inpatient Hospital Stay (HOSPITAL_COMMUNITY): Payer: Managed Care, Other (non HMO) | Admitting: Occupational Therapy

## 2019-02-22 ENCOUNTER — Inpatient Hospital Stay (HOSPITAL_COMMUNITY): Payer: Managed Care, Other (non HMO)

## 2019-02-22 LAB — GLUCOSE, CAPILLARY
Glucose-Capillary: 233 mg/dL — ABNORMAL HIGH (ref 70–99)
Glucose-Capillary: 216 mg/dL — ABNORMAL HIGH (ref 70–99)
Glucose-Capillary: 184 mg/dL — ABNORMAL HIGH (ref 70–99)
Glucose-Capillary: 97 mg/dL (ref 70–99)

## 2019-02-22 LAB — OCCULT BLOOD X 1 CARD TO LAB, STOOL: Fecal Occult Bld: NEGATIVE

## 2019-02-22 MED ORDER — DELFLEX-LC/1.5% DEXTROSE 344 MOSM/L IP SOLN: INTRAPERITONEAL | Status: DC

## 2019-02-22 MED ORDER — HEPARIN 1000 UNIT/ML FOR PERITONEAL DIALYSIS: 500.0000 [IU] | INTRAMUSCULAR | Status: DC | PRN

## 2019-02-22 MED ORDER — SIMVASTATIN 5 MG PO TABS
5.0000 mg | ORAL_TABLET | Freq: Every day | ORAL | Status: DC
  Administered 2019-02-22 – 2019-02-23 (×2): 5 mg via ORAL
  Filled 2019-02-22 (×2): qty 1

## 2019-02-22 MED ORDER — DELFLEX-LC/1.5% DEXTROSE 344 MOSM/L IP SOLN
INTRAPERITONEAL | Status: DC
  Administered 2019-02-22: 12000 mL via INTRAPERITONEAL

## 2019-02-22 MED ORDER — PROMETHAZINE HCL 25 MG/ML IJ SOLN
25.0000 mg | Freq: Once | INTRAMUSCULAR | Status: AC
  Administered 2019-02-22: 25 mg via INTRAMUSCULAR
  Filled 2019-02-22: qty 1

## 2019-02-22 MED ORDER — GENTAMICIN SULFATE 0.1 % EX CREA: 1.0000 "application " | TOPICAL_CREAM | Freq: Every day | CUTANEOUS | Status: DC

## 2019-02-22 MED ORDER — HEPARIN 1000 UNIT/ML FOR PERITONEAL DIALYSIS
INTRAPERITONEAL | Status: DC | PRN
  Filled 2019-02-22: qty 5000

## 2019-02-22 MED ORDER — RENA-VITE PO TABS
1.0000 | ORAL_TABLET | Freq: Every day | ORAL | Status: DC
  Filled 2019-02-22: qty 1

## 2019-02-22 NOTE — Progress Notes (Signed)
Occupational Therapy Session Note  Patient Details  Name: Heather [name] MRN: [id number] Date of Birth: [birth year]  Today's Date: 02/22/2019 OT Individual Time: 1100-1200 OT Individual Time Calculation (min): 60 min    Short Term Goals: Week 1:  OT Short Term Goal 1 (Week 1): STG=LTG due to LOS  Skilled Therapeutic Interventions/Progress Updates:    Pt seen for OT session focusing on functional mobility and vestibular exercises. Pt sitting up in w/c upon arrival, denying pain and agreeable to tx session.  She donned B shoes and AFOs. Pt refusing donning socks for foot protection, education provided regarding decreased sensation and maintaining skin integrity/protection. Pt voiced understanding, however cont to refuse.  Pt ambulated throughout unit with close supervision using RW with VCs to lessen UE reliance on RW. In therapy gym, completed vestibular exercies focusing on head stabilization. Pt completed with min cuing and tolerated well without exacerbation of symptoms.  Orthotists arrived during session and completed assessment. Ambulation trials with foot-up brace, pt tolerating well and easier to don/doff than [place] AFO. Pt ambulated back to room at end of session, ambulating ~62ft, stepping over barriers with CGA and VCs for technique. Able to self initiate need for seated rest break. Pt returned to room and left seated in w/c with all needs in reach.   Therapy Documentation Precautions:  Precautions Precautions: Fall Restrictions Weight Bearing Restrictions: No Pain:   No/denies pain   Therapy/Group: Individual Therapy  Bliss Tsang L 02/22/2019, 7:01 AM

## 2019-02-22 NOTE — Progress Notes (Signed)
[place] PHYSICAL MEDICINE & REHABILITATION PROGRESS NOTE  Subjective/Complaints: Patient seen sitting up in bed this morning.  She states she slept fairly well overnight.  She denies complaints.  ROS: Denies CP, shortness of breath, nausea, vomiting, diarrhea.  Objective: Vital Signs: Blood pressure 109/66, pulse 88, temperature 97.7 F (36.5 C), temperature source Oral, resp. rate 18, height 5\' 9"  (1.753 m), weight 100.5 kg, SpO2 97 %. No results found. Recent Labs    02/21/19 0523  WBC 11.6*  HGB 10.7*  HCT 35.4*  PLT 259   Recent Labs    02/21/19 0523  NA 136  K 3.1*  CL 97*  CO2 25  GLUCOSE 142*  BUN 35*  CREATININE 11.11*  CALCIUM 8.0*    Physical Exam: BP 109/66 (BP Location: Right Arm)   Pulse 88   Temp 97.7 F (36.5 C) (Oral)   Resp 18   Ht 5\' 9"  (1.753 m)   Wt 100.5 kg   SpO2 97%   BMI 32.72 kg/m  Constitutional: No distress . Vital signs reviewed.  Obese. HENT: Normocephalic.  Atraumatic. Eyes: EOMI. No discharge. Cardiovascular: RRR.  No JVD.  + Murmur. Respiratory: CTA bilaterally.  Normal effort. GI: BS +. Non-distended.  + PD cath Musc: No edema or tenderness in extremities. Neurological: Alert. Motor: Grossly 4+/5 throughout, unchanged Skin: She isnot diaphoretic.  Psychiatric: Flat.  Assessment/Plan: 1. Functional deficits secondary to debility which require 3+ hours per day of interdisciplinary therapy in a comprehensive inpatient rehab setting.  Physiatrist is providing close team supervision and 24 hour management of active medical problems listed below.  Physiatrist and rehab team continue to assess barriers to discharge/monitor patient progress toward functional and medical goals  Care Tool:  Bathing  Bathing activity did not occur: Refused Body parts bathed by patient: Right arm, Left upper leg, Left arm, Right lower leg, Chest, Left lower leg, Abdomen, Front perineal area, Face, Buttocks, Right upper leg          Bathing assist Assist Level: Supervision/Verbal cueing     Upper Body Dressing/Undressing Upper body dressing   What is the patient wearing?: Pull over shirt    Upper body assist Assist Level: Independent    Lower Body Dressing/Undressing Lower body dressing      What is the patient wearing?: Underwear/pull up, Pants     Lower body assist Assist for lower body dressing: Supervision/Verbal cueing     Toileting Toileting    Toileting assist Assist for toileting: Minimal Assistance - Patient > 75%     Transfers Chair/bed transfer  Transfers assist  Chair/bed transfer activity did not occur: N/A  Chair/bed transfer assist level: Contact Guard/Touching assist     Locomotion Ambulation   Ambulation assist      Assist level: Minimal Assistance - Patient > 75% Assistive device: Orthosis Max distance: 30   Walk 10 feet activity   Assist     Assist level: Minimal Assistance - Patient > 75% Assistive device: Orthosis, Walker-rolling(bil AFOs)   Walk 50 feet activity   Assist Walk 50 feet with 2 turns activity did not occur: Safety/medical concerns  Assist level: Contact Guard/Touching assist Assistive device: Walker-rolling, Orthosis    Walk 150 feet activity   Assist Walk 150 feet activity did not occur: Safety/medical concerns  Assist level: Contact Guard/Touching assist Assistive device: Walker-rolling, Orthosis    Walk 10 feet on uneven surface  activity   Assist Walk 10 feet on uneven surfaces activity did not occur: Safety/medical concerns  Wheelchair     Assist Will patient use wheelchair at discharge?: No Type of Wheelchair: Manual    Wheelchair assist level: Supervision/Verbal cueing Max wheelchair distance: 60    Wheelchair 50 feet with 2 turns activity    Assist        Assist Level: Supervision/Verbal cueing   Wheelchair 150 feet activity     Assist     Assist Level: Supervision/Verbal cueing       Medical Problem List and Plan: 1.Functional declinesecondary todebilityafter PD relarted peritonitis/sepsis, 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 DVT Prophylaxis/Anticoagulation: Pharmaceutical:Heparin 3.Chronic back pain/Pain Management:No baclofen or muscle relaxers per Nephrology. ContinueTramadol as needed 4. Mood:LCSW to follow for evaluation and support 5. Neuropsych: This patientiscapable of making decisions on herown behalf. 6. Skin/Wound Care:Routine pressure relief measures. 7. Fluids/Electrolytes/Nutrition:Monitor I's and O's.  Filed Weights   02/21/19 0441 02/21/19 1835 02/22/19 0500  Weight: 99.7 kg 98.5 kg 100.5 kg   Stable on 2/27 8. ESRD:Continue PD in the evenings. Nephrology to follow for PDassistance/management and correction of electrolyte abnormalities.[name]  9.PD associated peritonitis: Completed course of vancomycin/Fortazon 2/22.   10.T2DM:   Lantus(on Tresiba at [employer] with meal coverage, increased to 23 on 2/24  Sliding scale insulin for tighter blood sugar control.   NovoLog 3 units 3 times daily started on 2/21  Labile on 2/27  Monitor with increased mobility 11.Chronic constipation: Encourage compliance with bowel program.  12. Anemia of chronic disease:On Arnaesp weekly. Continue to monitor with serial checks.   Hemoglobin 10.7 on 2/26  Continue to monitor 13. Neuropathy: Gabapentin changed to 100 mg daily on 2/19 14. Bacterial vaginosis: Completed course of Flagyl on 2/21 15. HTN: Monitor BP bid. On coreg bid.  Labile on 2/27  Monitor with increased mobility 16. Persistent Hypokalemia: Recs per nephro 17.  Pruritus  Benadryl cream ordered on 2/19  Resolved 18.  Nausea  Suspect related to multiple antibiotics + PD  Encouraged PRN medications  Improved overall 19.  Leukocytosis  WBCs 11.6 on 2/26  Labs ordered for  tomorrow  Afebrile  Continue to monitor   LOS: 9 days A FACE TO FACE EVALUATION WAS PERFORMED  Gerardo Territo Lorie Phenix 02/22/2019, 9:15 AM

## 2019-02-22 NOTE — Progress Notes (Signed)
Occupational Therapy Session Note  Patient Details  Name: Heather [name] MRN: [id number] Date of Birth: 05/22/1971  Today's Date: 02/22/2019 OT Individual Time: 5834-6219 OT Individual Time Calculation (min): 60 min    Short Term Goals: Week 1:  OT Short Term Goal 1 (Week 1): STG=LTG due to LOS     Skilled Therapeutic Interventions/Progress Updates:    Pt received in bed stating she felt much better today with only mild dizziness. Pt sat to EOB then ambulated to chair at sink to take a sponge bath.  She stood up several times and each time needed to take a short rest break after each stand. Worked on diaphramatic breathing during rest breaks. Overall pt only needed S with bathing and dressing with sitting and standing at the sink.  Pt ambulated to and from the sink with RW with S.   Pt sat in w/c and worked on Upper back ex using Level 2 theraband with lat pull downs and scapular retractions 10 reps for 3 sets. Recommended pt continue these at home. Pt in w/c in room with all needs met.  Therapy Documentation Precautions:  Precautions Precautions: Fall Restrictions Weight Bearing Restrictions: No    Vital Signs: Therapy Vitals BP: 131/85 Patient Position (if appropriate): Sitting Pain: Pain Assessment Pain Score: 0-No pain   Therapy/Group: Individual Therapy  [place] 02/22/2019, 10:14 AM

## 2019-02-22 NOTE — Progress Notes (Cosign Needed)
Physical Therapy Weekly Progress Note  Patient Details  Name: Heather [name] MRN: [id number] Date of Birth: [birth year]  Beginning of progress report period: 02/14/19 End of progress report period: 02/22/19  Today's Date: 02/22/2019 PT Individual Time: 2:00-3:15 , 75 min individual    Patient has met 1 of 6 short/long term goals.   Patient continues to demonstrate the following deficits muscle weakness and muscle joint tightness, decreased cardiorespiratoy endurance, unbalanced muscle activation, central origin and peripheral and decreased standing balance, decreased postural control and decreased balance strategies and therefore will continue to benefit from skilled PT intervention to increase functional independence with mobility.  Patient progressing toward long term goals..  Continue plan of care.  PT Short Term Goals Week 1:  PT Short Term Goal 1 (Week 1): =LTG due to ELOS Week 2:    = LTGs  Skilled Therapeutic Interventions/Progress Updates:   Pt sitting up in w/c, with Brooke Pace here, fitting her with bil Foot UP braces to address bil foot drop. Foot UP braces donned for entire PT session.  Pain: pt denies  Pt performed R/L hamstring and heel cord stretching in sitting, using strap. Static heel cord stretch first for 30 sec bil, followed by contract relax method with 5 second contraction and 10 second stretch x 2 bil for heel cord stretch. Seated R/L hip flexion x 10, R/L LAQ x 10 repetitions with 2 second isometric hold. Ankle DF with slight resistance to increase dorsiflexor activation x 20 bil.    Pt educated on footwear to support ankle pronation.   Sit>< stand with close supervision throughout session.  Gait training x 150' ft over level tile with Rollator and supervision assist with one turn and rest break seated on the rollator. Rollator trialed since pt has a rollator at home.   Stair training up and down 5 inch curb x1 with RW and Supervision A.   Step ups  performed with 4 in step bil 5 repetitions x 2 with CGA and cues for hand placement to decrease UE use.   Standing exercises at counter including mini squats, R/L hamstring curls, heel raises x 10 repetitions each. Standing hip abductions x5 bil. Pt issued handout with exercise instructions.   Pt left resting in w/c with needs in reach. Instructed to complete VOR exercise with increased speed after resting due to reports of no vertigo when performing VOR exercises previously.        Therapy Documentation Precautions:  Precautions Precautions: Fall Restrictions Weight Bearing Restrictions: No    Therapy/Group: Individual Therapy  COOK,CAROLINE 02/22/2019, 2:15 PM

## 2019-02-22 NOTE — Progress Notes (Signed)
Pt is experiencing nausea and vomiting x1. Zofran IV administered at 2115. Pt is currently experiencing dizziness, nausea, and vomiting at this time after vomiting. Pt is currently getting peritoneal dialysis  Irving Burton notified via telephone and was made aware. Orders received to administer Phenegran 25 mg IM once.

## 2019-02-22 NOTE — Plan of Care (Signed)
Nutrition Education Note  RD consulted for nutrition education for peritoneal dialysis. RD provided handouts regarding potassium and phosphorus. Handouts included target blood levels for both potassium and phosphorus, foods and beverages high in each nutrient, and foods and beverages low in each nutrient. Reviewed food groups and provided written recommended serving sizes specifically determined for patient's current nutritional status. Reviewed pt's current labs and medications.  Pt somewhat demoralized about experience with peritoneal dialysis stating that it limits her every day when she gets home from work due to "being hooked up to the machine." Pt reports she was on HD from July to November 2019 and started PD in November 2019. Pt states that at first, she did manual exchanges but now uses the machine nightly. Provided active listening and encouraged pt to bring up her concerns with outpatient nephrologist.  Explained why diet restrictions are needed and provided lists of foods to limit/avoid that are high potassium, sodium, and phosphorus. Provided specific recommendations on safer alternatives of these foods. Strongly encouraged compliance of this diet.  Pt very concerned about high phosphorus level and asked how she can lower it. Reviewed phosphorus binder (Fosrenol) and how pt has been refusing Fosrenol per MAR. Pt states that she didn't know what it was and therefore wasn't taking it. RD explained purpose of binders and strongly encouraged adherence to prescribed regimen.  Discussed importance of protein intake at each meal and snack. Provided examples of how to maximize protein intake throughout the day. Discussed need for fluid restriction with dialysis and renal-friendly beverage options.  Encouraged pt to discuss specific diet questions/concerns with RD or nephrologist at outpatient facility. Teach back method used.  Expect good compliance.  Body mass index is 32.72 kg/m. Pt meets  criteria for obesity class I based on current BMI.  Current diet order is Renal/Carb Modified with 1200 ml fluid restritcion, patient is consuming approximately 50-100% of meals at this time. Labs and medications reviewed. No further nutrition interventions warranted at this time. RD contact information provided. If additional nutrition issues arise, please re-consult RD.   Gaynell Face, MS, RD, LDN Inpatient Clinical Dietitian Pager: [phone] Weekend/After Hours: [phone]

## 2019-02-22 NOTE — Progress Notes (Signed)
Pt refused to take Heparin SQ along with other scheduled night time medications. Pt educated on the importance of taking all medication(risk/benefits). Pt still refused to take medication.

## 2019-02-22 NOTE — Progress Notes (Signed)
Social Work Patient ID: Heather [name], female   DOB: Apr 01, 1971, 48 y.o.   MRN: [id number] Met with pt to discuss OP therapies, she is close to [facility] for Outpatient Rehab. Have faxed referral to them and they will contact pt for follow up therapy appointments. She has all equipment from previous admits. [name] pleased with her progress while here and her therapy goals.

## 2019-02-22 NOTE — Progress Notes (Signed)
Subjective: Interval History: has no complaint.  Objective: Vital signs in last 24 hours: Temp:  [97.7 F (36.5 C)-98.2 F (36.8 C)] 97.7 F (36.5 C) (02/27 0500) Pulse Rate:  [82-88] 88 (02/27 0500) Resp:  [16-18] 18 (02/27 0500) BP: (91-156)/(64-87) 109/66 (02/27 0500) SpO2:  [95 %-99 %] 97 % (02/27 0500) Weight:  [98.5 kg-100.5 kg] 100.5 kg (02/27 0500) Weight change: -3.6 kg  Intake/Output from previous day: 02/26 0701 - 02/27 0700 In: 452 [P.O.:452] Out: 12011  Intake/Output this shift: No intake/output data recorded.  General appearance: alert, cooperative, no distress and moderately obese Resp: diminished breath sounds bilaterally Cardio: S1, S2 normal and systolic murmur: systolic ejection 2/6, crescendo and decrescendo at 2nd left intercostal space GI: pos bs, pos FW, obese, PD cath L mid abdm Extremities: extremities normal, atraumatic, no cyanosis or edema  Lab Results: Recent Labs    02/21/19 0523  WBC 11.6*  HGB 10.7*  HCT 35.4*  PLT 259   BMET:  Recent Labs    02/21/19 0523  NA 136  K 3.1*  CL 97*  CO2 25  GLUCOSE 142*  BUN 35*  CREATININE 11.11*  CALCIUM 8.0*   No results for input(s): PTH in the last 72 hours. Iron Studies: No results for input(s): IRON, TIBC, TRANSFERRIN, FERRITIN in the last 72 hours.  Studies/Results: No results found.  I have reviewed the patient's current medications.  Assessment/Plan: 1 ESRD PD going well, vol ok.  Hopefully better clearance 2 DM controlled 3 Anemia  4 HPTH vit D 5 Debill P PD, 1.5%, mobilize    LOS: 9 days   Heather [name] 02/22/2019,9:14 AM

## 2019-02-23 ENCOUNTER — Inpatient Hospital Stay (HOSPITAL_COMMUNITY): Payer: Managed Care, Other (non HMO)

## 2019-02-23 ENCOUNTER — Inpatient Hospital Stay (HOSPITAL_COMMUNITY): Payer: Managed Care, Other (non HMO) | Admitting: Occupational Therapy

## 2019-02-23 LAB — CBC WITH DIFFERENTIAL/PLATELET
Abs Immature Granulocytes: 0.16 10*3/uL — ABNORMAL HIGH (ref 0.00–0.07)
Basophils Absolute: 0.1 10*3/uL (ref 0.0–0.1)
Basophils Relative: 1 %
Eosinophils Absolute: 0.1 10*3/uL (ref 0.0–0.5)
Eosinophils Relative: 1 %
HCT: 33 % — ABNORMAL LOW (ref 36.0–46.0)
Hemoglobin: 10.4 g/dL — ABNORMAL LOW (ref 12.0–15.0)
Immature Granulocytes: 2 %
Lymphocytes Relative: 25 %
Lymphs Abs: 2.6 10*3/uL (ref 0.7–4.0)
MCH: 29.3 pg (ref 26.0–34.0)
MCHC: 31.5 g/dL (ref 30.0–36.0)
MCV: 93 fL (ref 80.0–100.0)
Monocytes Absolute: 0.9 10*3/uL (ref 0.1–1.0)
Monocytes Relative: 8 %
Neutro Abs: 6.6 10*3/uL (ref 1.7–7.7)
Neutrophils Relative %: 63 %
Platelets: ADEQUATE 10*3/uL (ref 150–400)
RBC: 3.55 MIL/uL — ABNORMAL LOW (ref 3.87–5.11)
RDW: 15.6 % — ABNORMAL HIGH (ref 11.5–15.5)
WBC: 10.4 10*3/uL (ref 4.0–10.5)
nRBC: 0 % (ref 0.0–0.2)

## 2019-02-23 LAB — GLUCOSE, CAPILLARY
GLUCOSE-CAPILLARY: 119 mg/dL — AB (ref 70–99)
Glucose-Capillary: 209 mg/dL — ABNORMAL HIGH (ref 70–99)
Glucose-Capillary: 162 mg/dL — ABNORMAL HIGH (ref 70–99)
Glucose-Capillary: 157 mg/dL — ABNORMAL HIGH (ref 70–99)

## 2019-02-23 MED ORDER — LUBIPROSTONE 24 MCG PO CAPS: 24.0000 ug | ORAL_CAPSULE | Freq: Two times a day (BID) | ORAL | Status: DC

## 2019-02-23 MED ORDER — ACETAMINOPHEN 325 MG PO TABS: 325.0000 mg | ORAL_TABLET | ORAL | Status: DC | PRN

## 2019-02-23 MED ORDER — DELFLEX-LC/1.5% DEXTROSE 344 MOSM/L IP SOLN: INTRAPERITONEAL | Status: DC

## 2019-02-23 MED ORDER — GENTAMICIN SULFATE 0.1 % EX CREA
1.0000 "application " | TOPICAL_CREAM | Freq: Every day | CUTANEOUS | Status: DC
  Administered 2019-02-23: 1 via TOPICAL
  Filled 2019-02-23: qty 15

## 2019-02-23 MED ORDER — BENEPROTEIN PO POWD
1.0000 | Freq: Three times a day (TID) | ORAL | Status: DC
  Filled 2019-02-23: qty 227

## 2019-02-23 MED ORDER — GABAPENTIN 100 MG PO CAPS: 100.0000 mg | ORAL_CAPSULE | Freq: Every day | ORAL | 0 refills | Status: DC

## 2019-02-23 MED ORDER — SIMVASTATIN 10 MG PO TABS: 5.0000 mg | ORAL_TABLET | Freq: Every day | ORAL | 1 refills | Status: DC

## 2019-02-23 MED ORDER — DELFLEX-LC/1.5% DEXTROSE 344 MOSM/L IP SOLN
INTRAPERITONEAL | Status: DC
  Administered 2019-02-23: 19:00:00 via INTRAPERITONEAL

## 2019-02-23 MED ORDER — RENA-VITE PO TABS: 1.0000 | ORAL_TABLET | Freq: Every day | ORAL | 0 refills | Status: DC

## 2019-02-23 MED ORDER — HEPARIN 1000 UNIT/ML FOR PERITONEAL DIALYSIS: 500.0000 [IU] | INTRAMUSCULAR | Status: DC | PRN

## 2019-02-23 MED ORDER — BENEPROTEIN PO POWD: 1.0000 | Freq: Three times a day (TID) | ORAL | 0 refills | Status: DC

## 2019-02-23 MED ORDER — INSULIN GLARGINE 100 UNIT/ML ~~LOC~~ SOLN: 23.0000 [IU] | Freq: Every day | SUBCUTANEOUS | 11 refills | Status: DC

## 2019-02-23 MED ORDER — TRAZODONE HCL 50 MG PO TABS: 25.0000 mg | ORAL_TABLET | Freq: Every evening | ORAL | 0 refills | Status: DC | PRN

## 2019-02-23 MED ORDER — HEPARIN 1000 UNIT/ML FOR PERITONEAL DIALYSIS
INTRAPERITONEAL | Status: DC | PRN
  Filled 2019-02-23: qty 5000

## 2019-02-23 MED ORDER — GENTAMICIN SULFATE 0.1 % EX CREA: 1.0000 "application " | TOPICAL_CREAM | Freq: Every day | CUTANEOUS | 0 refills | Status: DC

## 2019-02-23 MED ORDER — PROMETHAZINE HCL 25 MG PO TABS: 25.0000 mg | ORAL_TABLET | Freq: Two times a day (BID) | ORAL | 0 refills | Status: DC | PRN

## 2019-02-23 MED ORDER — LANTHANUM CARBONATE 1000 MG PO CHEW: 1000.0000 mg | CHEWABLE_TABLET | Freq: Three times a day (TID) | ORAL | Status: DC

## 2019-02-23 NOTE — Progress Notes (Signed)
Pt is sitting up in bed not showing any signs of discomfort or distress. Pt denies feeling nauseous and has not had any more n/v after receiving antemetic last night.

## 2019-02-23 NOTE — Progress Notes (Addendum)
Physical Therapy Discharge Summary  Patient Details  Name: Heather [name] MRN: [id number] Date of Birth: 10/27/1971  Today's Date: 02/23/2019 PT Individual Time: 1115-1200 PT Individual Time Calculation (min): 45 min    Patient has met 1 of 6 long term goals due to improved activity tolerance, improved balance, improved postural control, increased strength, increased range of motion, ability to compensate for deficits and functional use of  right lower extremity and left lower extremity.  Today her performance was affected by lethargy, fatigue, dizziness/vertigo.  Patient to discharge at an ambulatory level, Supervision.   Pt's husband has been helping her in/out of the 2 STE their house for years, per pt.   Reasons goals not met: vertigo/dizziness  Recommendation:  Patient will benefit from ongoing skilled PT services in outpatient setting to continue to advance safe functional mobility, address ongoing impairments in balance, activity tolerance, strength, flexibility, pt education, RLE neuro re-education, and minimize fall risk.  Equipment: bil Foot up braces;  pt owns RW and rollator PT strongly advises purchase of supportive shoes that will limit overpronation  Reasons for discharge: treatment goals met and discharge from hospital  Patient/family agrees with progress made and goals achieved: Yes  PT Discharge  Pt sitting up in w/c, feeling lethargic today, due to N and V last night, poor sleep, and meds for nausea and sleep.  She agreed to limited activity this session.    PROM bil heel cords.  In sitting, she performed 10 x 1 each R/L long arc quad knee extensions, R/L hip flexion, R/L ankle DF.      Simulated car transfer with RW, supervision.    Gait training with RW on level tile x 50' with supervision, before sitting suddenly because of "spinning" per pt. Stairs NT due to feeling ill. Pt is unable to distinguish dizziness due to post-PD low fluid volume vs vertigo due  to central/peripheral vertigo symptoms. Her presentation is complex.   Pt returned to room and transferred to bed with RW, supervision.  Pt left resting in bed with alarm set and needs at hand.  Precautions/Restrictions Precautions Precautions: Fall Restrictions Weight Bearing Restrictions: No    Pain Pain Assessment Pain Scale: 0-10 Pain Score: 0-No pain Vision/Perception- no change; wears reading glasses     Cognition Overall Cognitive Status: Within Functional Limits for tasks assessed Arousal/Alertness: Suspect due to medications Pt had Phenergan and sleeping aid due to N and V, and inabilito to sleep last night Orientation Level: Oriented X4 Sustained Attention: Appears intact Memory: Appears intact Awareness: Appears intact Problem Solving: Appears intact Safety/Judgment: Appears intact Sensation Sensation Light Touch: Impaired Detail Peripheral sensation comments: peripheral neuropathy Light Touch Impaired Details: Impaired RLE;Impaired LLE;Impaired RUE;Impaired LUE Proprioception: Impaired Detail Proprioception Impaired Details: Impaired RLE;Impaired LLE Coordination Gross Motor Movements are Fluid and Coordinated: No Fine Motor Movements are Fluid and Coordinated: Yes Coordination and Movement Description: limited by generalized weakness and neuropathy and hx R hemiparesis  Heel Shin Test: = bil, slightly limited excursion; improved since admission Motor  Motor Motor: Hemiplegia(mild, residual) Motor - Skilled Clinical Observations: 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 Motor - Discharge Observations: 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  Mobility Bed Mobility Rolling Right: Independent Rolling Left: Independent Supine to Sit: Independent Sit to Supine: Independent Locomotion  Gait Gait Distance (Feet): 50 Feet(at times, 150') Assistive device: Rolling walker Gait Gait: Yes Gait Pattern: Impaired(dec B step length) Gait Pattern: Decreased trunk rotation;Trunk  flexed;Narrow base of support;Decreased dorsiflexion - left;Decreased dorsiflexion -  right;Step-through pattern;Decreased hip/knee flexion - right;Decreased hip/knee flexion - left;Right foot flat Ankle - Stance Phase - Impaired Gait Pattern: Decreased push off/heel off - Right;Decreased push off/heel off - Left Gait velocity: decreased Stairs / Additional Locomotion Stairs: No(previously 1 step with RW, supervison) Wheelchair Mobility Wheelchair Mobility: No  Trunk/Postural Assessment  Cervical Assessment Cervical Assessment: Exceptions to WFL(Forward head) Thoracic Assessment Thoracic Assessment: Exceptions to WFL(Rounded shoulders; Kyphotic) Lumbar Assessment Lumbar Assessment: Within Functional Limits Postural Control Postural Control: Deficits on evaluation(tight hamstrings limit trunk and hip extension)  Balance Balance Balance Assessed: Yes Static Sitting Balance Static Sitting - Balance Support: No upper extremity supported;Feet supported Static Sitting - Level of Assistance: 7: Independent Dynamic Sitting Balance Dynamic Sitting - Balance Support: No upper extremity supported;Feet supported;During functional activity Dynamic Sitting - Level of Assistance: 5: Stand by assistance;7: Independent Static Standing Balance Static Standing - Balance Support: No upper extremity supported;During functional activity Static Standing - Level of Assistance: 5: Stand by assistance Dynamic Standing Balance Dynamic Standing - Balance Support: Bilateral upper extremity supported;During functional activity Dynamic Standing - Level of Assistance: 6: Modified independent (Device/Increase time) Extremity Assessment      RLE Assessment RLE Assessment: Exceptions to [facility] Passive Range of Motion (PROM) Comments: tight hmastrings, NT and heel cords, approx 0 degrees ankle DF wiht extended knee General Strength Comments: impaired, see below; tested grossly in sitting RLE Strength Right Hip  Flexion: 4/5 Right Knee Flexion: 4/5 Right Knee Extension: 4+/5 Right Ankle Dorsiflexion: 3-/5 (AROM limited by tight heel cords)  LLE Assessment LLE Assessment: Exceptions to [facility] Passive Range of Motion (PROM) Comments: tight hamstrings, not measured; tight heel cord with 10 degrees ankle DF General Strength Comments: impaired, see below LLE Strength Left Hip Flexion: 4+/5 Left Knee Flexion: 4+/5 Left Knee Extension: 4/5 Left Ankle Dorsiflexion: 4-/5(limited AROM due to tight heel cord)    Deadra Diggins 02/23/2019, 12:25 PM

## 2019-02-23 NOTE — Discharge Summary (Signed)
Physician Discharge Summary  Patient ID: Heather [name] MRN: [id number] DOB/AGE: May 10, 1971 48 y.o.  Admit date: 02/13/2019 Discharge date: 02/24/2019  Discharge Diagnoses:  Principal Problem:   Debility Active Problems:   Neuropathic pain   Renovascular hypertension   Anemia of chronic disease   Diabetes mellitus type 2 in obese (HCC)   ESRD on dialysis ([place])   Bacterial peritonitis ([place])   Leucocytosis   Discharged Condition: stable   Significant Diagnostic Studies: N/A    Labs:  Basic Metabolic Panel: BMP Latest Ref Rng & Units 02/21/2019 02/15/2019 02/13/2019  Glucose 70 - 99 mg/dL 142(H) 193(H) 114(H)  BUN 6 - 20 mg/dL 35(H) 35(H) 34(H)  Creatinine 0.44 - 1.00 mg/dL 11.11(H) 9.45(H) 7.93(H)  BUN/Creat Ratio - - - -  Sodium 135 - 145 mmol/L 136 136 139  Potassium 3.5 - 5.1 mmol/L 3.1(L) 3.5 3.2(L)  Chloride 98 - 111 mmol/L 97(L) 97(L) 100  CO2 22 - 32 mmol/L 25 25 23   Calcium 8.9 - 10.3 mg/dL 8.0(L) 8.1(L) 8.3(L)    CBC: CBC Latest Ref Rng & Units 02/23/2019 02/21/2019 02/15/2019  WBC 4.0 - 10.5 K/uL 10.4 11.6(H) 10.2  Hemoglobin 12.0 - 15.0 g/dL 10.4(L) 10.7(L) 11.8(L)  Hematocrit 36.0 - 46.0 % 33.0(L) 35.4(L) 37.2  Platelets 150 - 400 K/uL PLATELET CLUMPS NOTED ON SMEAR, COUNT APPEARS ADEQUATE 259 264    CBG: Recent Labs  Lab 02/22/19 1159 02/22/19 1635 02/22/19 2130 02/23/19 0658 02/23/19 1135  GLUCAP 216* 184* 97 119* 209*     Brief HPI:   Heather [name] is a 48 year old female with history of T2DM with retinopathy, neuropathy and gastroparesis, B12 deficiency, chronic back pain/fibromyalgia, 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  She was found to have bacterial peritonitis and started on IV antibiotics as well as IV pain meds.  Wet prep was positive for bacterial vaginosis and she was started on Flagyl but this was  DC'd due to ongoing issues with nausea.  Pelvic ultrasound revealed 2.8 cm left ovarian cyst and follow-up ultrasound recommended in 6 to 12 weeks.  On 2/11, she arrested requiring CODE BLUE and CPR to resuscitate his past dose of IV Dilaudid.  Monitor Narcan and EKG showed no acute findings.  PD fluid now grossly clear and cultures negative.  Episode of increasing abdominal pain with shortness of breath on 2/17 and V/Q scan done revealing low probability for PE.  Patient had resolved and abdominal pain was improving.  Therapy was ongoing but patient was noted to have significant balance deficits as well as limitations due to debility.  Care was recommended for follow-up therapy   Hospital Course: Heather [name] was admitted to rehab 02/13/2019 for inpatient therapies to consist of PT and OT at least three hours five days a week. Past admission physiatrist, therapy team and rehab RN have worked together to provide customized collaborative inpatient rehab.  She completed 2 week course of vancomycin and Fortaz through 2/22.  Serial CBC shows anemia of chronic disease stable on weekly aranesp. She has refused fosrenol an diet was downgraded to renal with FR per nephrology who has been following for management of PD.  Exchange rates were adjusted due to SOB with higher volume dwells. PD catheter site is dry without signs of infection/irritation.  She has had issues with constipation but has required encouragement to adhere to bowel  program.  She completed 5 day course of flagyl for treatment of bacterial vaginosis without SE. Gabapentin was resumed at 100 mg daily to help manage neuropathy. Blood pressures have been relatively controlled on coreg. Diabetes has been monitored with ac/hs CBG checks and Lantus was titrated upwards towards home dose. Po intake has improved and she was encouraged to continue to use protein supplements to help with low protein stores. Her endurance levels are improving and she has  progressed to supervision level. She will continue to receive follow up outpatient PT and OT at [facility] after discharge.    Rehab course: During patient's stay in rehab weekly team conference was held to monitor patient's progress, set goals and discuss barriers to discharge. At admission, patient required min assist with mobility and basic self care tasks.  She  has had improvement in activity tolerance, balance, postural control as well as ability to compensate for deficits.  [name] able to complete ADL tasks at modified independent to supervision level. She requires supervision with mobility.  Family education completed regarding assistance needed.    Disposition: Home  Diet: Renal/Diabetic diet. 1200 FR.   Special Instructions: 1. Will need pelvic ultrasound in 4 weeks for follow up on ovarian cyst.   Discharge Instructions    Ambulatory referral to Physical Medicine Rehab   Complete by:  As directed    2 weeks transitional care appt/return to work?    2. Continue to use SSI per home regimen.    Allergies as of 02/24/2019      Reactions   Ibuprofen Other (See Comments)   CKD stage 3. Should avoid.   Dilaudid [hydromorphone Hcl] Itching, Other (See Comments)   Can take with Benadryl.   Tramadol Itching      Medication List    STOP taking these medications   carvedilol 6.25 MG tablet Commonly known as:  COREG   metroNIDAZOLE 0.75 % vaginal gel Commonly known as:  METROGEL   Vitamin D (Ergocalciferol) 1.25 MG (50000 UT) Caps capsule Commonly known as:  DRISDOL     TAKE these medications   acetaminophen 325 MG tablet Commonly known as:  TYLENOL Take 1-2 tablets (325-650 mg total) by mouth every 4 (four) hours as needed for mild pain.   gabapentin 100 MG capsule Commonly known as:  NEURONTIN Take 1 capsule (100 mg total) by mouth daily. What changed:    medication strength  how much to take  when to take this  reasons to take this   gentamicin cream 0.1  % Commonly known as:  GARAMYCIN Apply 1 application topically daily.   insulin glargine 100 UNIT/ML injection Commonly known as:  LANTUS Inject 0.23 mLs (23 Units total) into the skin daily with lunch. What changed:  how much to take   lanthanum 1000 MG chewable tablet Commonly known as:  FOSRENOL Chew 1 tablet (1,000 mg total) by mouth 3 (three) times daily with meals.   lidocaine-prilocaine cream Commonly known as:  EMLA Apply 1 application topically as directed. For Dialysis use   lubiprostone 24 MCG capsule Commonly known as:  AMITIZA Take 1 capsule (24 mcg total) by mouth 2 (two) times daily with a meal. What changed:  additional instructions   multivitamin Tabs tablet Take 1 tablet by mouth at bedtime.   promethazine 25 MG tablet Commonly known as:  PHENERGAN Take 1 tablet (25 mg total) by mouth every 12 (twelve) hours as needed for nausea.   protein supplement Powd Take 6 g by  mouth 3 (three) times daily with meals. Notes to patient:  Mix it with some type of soft/pureed foods. You need this to build up our protein stores.    simvastatin 10 MG tablet Commonly known as:  ZOCOR Take 0.5 tablets (5 mg total) by mouth at bedtime. 1/2 po q hs - GFR 14   traZODone 50 MG tablet Commonly known as:  DESYREL Take 0.5-1 tablets (25-50 mg total) by mouth at bedtime as needed for sleep.      Follow-up Information    Briscoe Deutscher, DO Follow up on 02/28/2019.   Specialty:  Family Medicine Why:  Appointment @ 11:30 AM Contact information: [place] [address] [phone]        Luvenia Starch, MD Follow up.   Specialty:  Internal Medicine Contact information: [address] [place] [address] [phone]        Jamse Arn, MD Follow up.   Specialty:  Physical Medicine and Rehabilitation Why:  office will call you with follow up appointment Contact information: [address] [place]  [address] [phone]           Signed: Bary Leriche 02/26/2019, 5:33 PM

## 2019-02-23 NOTE — Progress Notes (Signed)
Occupational Therapy Session Note  Patient Details  Name: Heather [name] MRN: [id number] Date of Birth: 1970-12-29  Today's Date: 02/23/2019 OT Individual Time: 1005-1025 OT Individual Time Calculation (min): 20 min  and Today's Date: 02/23/2019 OT Missed Time: 40 Minutes Missed Time Reason: Patient unwilling/refused to participate without medical reason   Short Term Goals: Week 1:  OT Short Term Goal 1 (Week 1): STG=LTG due to LOS  Skilled Therapeutic Interventions/Progress Updates:    Pt seen for OT session focusing on ADL re-training and education. RN present upon arrival disconnecting pt from PD, therefore 5 minutes of tx session missed. Upon OTs return, pt sitting EOB and dneying pain. She was refusing bathing/dressing this morning, desiring to wear hspital gown despite therapist's encouragement, hospital gown donned with set-up. She donned socks/shoes and foot-up brace seated EOB with education provided regarding donning techniques of brace. Pt then ambulated to sink and completed grooming tasks standing at sink mod I. Requested seated rest break, upon sitting pt deciding she did not want to participate in tx session and requesting to end session. Pt reporting increased fatigue and frustration from the night before and requesting just to rest at this time. Pt agreeable to staying up in w/c. Left seated in w/c with all needs in reach. RN made aware of pt's refusal and complaints.  Will attempt to make up time as schedule and pt allows.   Therapy Documentation Precautions:  Precautions Precautions: Fall Restrictions Weight Bearing Restrictions: No Pain:   No/denies pain   Therapy/Group: Individual Therapy  Dalton Molesworth L 02/23/2019, 7:08 AM

## 2019-02-23 NOTE — Progress Notes (Signed)
PHYSICAL MEDICINE & REHABILITATION PROGRESS NOTE  Subjective/Complaints: Patient seen laying in bed this morning.  She states she slept well overnight, confirmed with sleep chart.  [name] looking forward to discharge tomorrow.  ROS: Denies CP, shortness of breath, nausea, vomiting, diarrhea.  Objective: Vital Signs: Blood pressure 105/62, pulse 87, temperature 98.3 F (36.8 C), resp. rate 16, height 5\' 9"  (1.753 m), weight 99.6 kg, SpO2 96 %. No results found. Recent Labs    02/21/19 0523  WBC 11.6*  HGB 10.7*  HCT 35.4*  PLT 259   Recent Labs    02/21/19 0523  NA 136  K 3.1*  CL 97*  CO2 25  GLUCOSE 142*  BUN 35*  CREATININE 11.11*  CALCIUM 8.0*    Physical Exam: BP 105/62 (BP Location: Right Arm)   Pulse 87   Temp 98.3 F (36.8 C)   Resp 16   Ht 5\' 9"  (1.753 m)   Wt 99.6 kg   SpO2 96%   BMI 32.43 kg/m  Constitutional: No distress . Vital signs reviewed.  Obese. HENT: Normocephalic.  Atraumatic. Eyes: EOMI. No discharge. Cardiovascular: RRR.  No JVD.  + Murmur. Respiratory: CTA bilaterally.  Normal effort. GI: BS +. Non-distended.  + PD cath Musc: No edema or tenderness in extremities. Neurological: Alert. Motor: Grossly 4+/5 throughout, stable Skin: She isnot diaphoretic.  Psychiatric: Flat.  Assessment/Plan: 1. Functional deficits secondary to debility which require 3+ hours per day of interdisciplinary therapy in a comprehensive inpatient rehab setting.  Physiatrist is providing close team supervision and 24 hour management of active medical problems listed below.  Physiatrist and rehab team continue to assess barriers to discharge/monitor patient progress toward functional and medical goals  Care Tool:  Bathing  Bathing activity did not occur: Refused Body parts bathed by patient: Right arm, Left upper leg, Left arm, Right lower leg, Chest, Left lower leg, Abdomen, Front perineal area, Face, Buttocks, Right upper leg          Bathing assist Assist Level: Supervision/Verbal cueing     Upper Body Dressing/Undressing Upper body dressing   What is the patient wearing?: Pull over shirt    Upper body assist Assist Level: Independent    Lower Body Dressing/Undressing Lower body dressing      What is the patient wearing?: Underwear/pull up, Pants     Lower body assist Assist for lower body dressing: Supervision/Verbal cueing     Toileting Toileting    Toileting assist Assist for toileting: Minimal Assistance - Patient > 75%     Transfers Chair/bed transfer  Transfers assist  Chair/bed transfer activity did not occur: N/A  Chair/bed transfer assist level: Contact Guard/Touching assist     Locomotion Ambulation   Ambulation assist      Assist level: Contact Guard/Touching assist Assistive device: Orthosis(bil) Max distance: 150   Walk 10 feet activity   Assist     Assist level: Contact Guard/Touching assist Assistive device: Rollator   Walk 50 feet activity   Assist Walk 50 feet with 2 turns activity did not occur: Safety/medical concerns  Assist level: Contact Guard/Touching assist Assistive device: Walker-rolling, Orthosis    Walk 150 feet activity   Assist Walk 150 feet activity did not occur: Safety/medical concerns  Assist level: Contact Guard/Touching assist Assistive device: Rollator, Orthosis    Walk 10 feet on uneven surface  activity   Assist Walk 10 feet on uneven surfaces activity did not occur: Safety/medical concerns  Wheelchair     Assist Will patient use wheelchair at discharge?: No Type of Wheelchair: Manual    Wheelchair assist level: Supervision/Verbal cueing Max wheelchair distance: 60    Wheelchair 50 feet with 2 turns activity    Assist        Assist Level: Supervision/Verbal cueing   Wheelchair 150 feet activity     Assist     Assist Level: Supervision/Verbal cueing      Medical Problem List and  Plan: 1.Functional declinesecondary todebilityafter PD relarted peritonitis/sepsis, pt with history of right cerebellar infarct with residual right limb/truncal ataxia and balance deficits  Continue CIR  Plan for d/c tomorrow  Will see patient for transitional care management in 1-2 weeks post-discharge 2. DVT Prophylaxis/Anticoagulation: Pharmaceutical:Heparin 3.Chronic back pain/Pain Management:No baclofen or muscle relaxers per Nephrology. ContinueTramadol as needed 4. Mood:LCSW to follow for evaluation and support 5. Neuropsych: This patientiscapable of making decisions on herown behalf. 6. Skin/Wound Care:Routine pressure relief measures. 7. Fluids/Electrolytes/Nutrition:Monitor I's and O's.  Filed Weights   02/22/19 0500 02/22/19 1845 02/23/19 0500  Weight: 100.5 kg 102.2 kg 99.6 kg   Stable on 2/28 8. ESRD:Continue PD in the evenings. Nephrology to follow for PDassistance/management and correction of electrolyte abnormalities.[name]  9.PD associated peritonitis: Completed course of vancomycin/Fortazon 2/22.   10.T2DM:   Lantus(on Tresiba at [employer] with meal coverage, increased to 23 on 2/24  Sliding scale insulin for tighter blood sugar control.   NovoLog 3 units 3 times daily started on 2/21  Labile on 2/28, will maintain for now, however will need ambulatory monitoring and further adjustments.  Monitor with increased mobility 11.Chronic constipation: Encourage compliance with bowel program.  12. Anemia of chronic disease:On Arnaesp weekly. Continue to monitor with serial checks.   Hemoglobin 10.7 on 2/26  Hemoccult negative  Continue to monitor 13. Neuropathy: Gabapentin changed to 100 mg daily on 2/19 14. Bacterial vaginosis: Completed course of Flagyl on 2/21 15. HTN: Monitor BP bid. On coreg bid.  Labile, but relatively controlled on 2/28  Monitor with increased mobility 16. Persistent Hypokalemia: Recs per nephro 17.   Pruritus  Benadryl cream ordered on 2/19  Resolved 18.  Nausea  Suspect related to multiple antibiotics + PD  Encouraged PRN medications  Improved overall 19.  Leukocytosis  WBCs 11.6 on 2/26  Labs pending  Afebrile  Continue to monitor   LOS: 10 days A FACE TO FACE EVALUATION WAS PERFORMED   Lorie Phenix 02/23/2019, 6:55 AM

## 2019-02-23 NOTE — Progress Notes (Signed)
PD tx initiated via tenckhoff w/o problem VSS Report given to Rosita Fire, RN

## 2019-02-23 NOTE — Progress Notes (Signed)
Subjective: Interval History: has no complaint , ready to go home.  Objective: Vital signs in last 24 hours: Temp:  [97.6 F (36.4 C)-98.3 F (36.8 C)] 98.3 F (36.8 C) (02/28 0515) Pulse Rate:  [87-96] 87 (02/28 0515) Resp:  [16-17] 16 (02/28 0515) BP: (92-149)/(58-91) 105/62 (02/28 0515) SpO2:  [96 %-100 %] 96 % (02/28 0515) Weight:  [99.6 kg-102.2 kg] 99.6 kg (02/28 0500) Weight change: 3.7 kg  Intake/Output from previous day: 02/27 0701 - 02/28 0700 In: 12229 [P.O.:218] Out: 12564  Intake/Output this shift: No intake/output data recorded.  General appearance: alert, cooperative, no distress and morbidly obese Resp: diminished breath sounds bilaterally Cardio: S1, S2 normal and systolic murmur: systolic ejection 2/6, crescendo and decrescendo at 2nd left intercostal space GI: pos bs, liver down 4 cm, PD cath L mid abdm, pos FW Extremities: extremities normal, atraumatic, no cyanosis or edema  Lab Results: Recent Labs    02/21/19 0523 02/23/19 0646  WBC 11.6* 10.4  HGB 10.7* 10.4*  HCT 35.4* 33.0*  PLT 259 PLATELET CLUMPS NOTED ON SMEAR, COUNT APPEARS ADEQUATE   BMET:  Recent Labs    02/21/19 0523  NA 136  K 3.1*  CL 97*  CO2 25  GLUCOSE 142*  BUN 35*  CREATININE 11.11*  CALCIUM 8.0*   No results for input(s): PTH in the last 72 hours. Iron Studies: No results for input(s): IRON, TIBC, TRANSFERRIN, FERRITIN in the last 72 hours.  Studies/Results: No results found.  I have reviewed the patient's current medications.  Assessment/Plan: 1 ESRD PD with 6 - 2 L exchanges tol well. Vol ok.  ES ok 2 Anemia stable 3 DM controlled 4 HPTH vit D 5 Obesity 6 Debill per rehab P HD, esa, control Dm, PD, 1.5%    LOS: 10 days   Jeneen Rinks Kilee Hedding 02/23/2019,8:47 AM

## 2019-02-23 NOTE — Progress Notes (Signed)
Physical Therapy Session Note  Patient Details  Name: LIBBI TOWNER MRN: [id number] Date of Birth: [birth year]  Today's Date: 02/23/2019 PT Individual Time: 1530-1550 PT Individual Time Calculation (min): 20 min  Missed 10 minutes due to fatigue  Short Term Goals: Week 2:  PT Short Term Goal 1 (Week 1): =LTG due to ELOS  Skilled Therapeutic Interventions/Progress Updates:    Patient seen for review and potential progression of HEP for vestibular adaptation.  Patient reported did not sleep due to nausea and vomiting overnight and still feels her BP is too low even though not pulling off as much fluid with the PD.   Performed bed mobility independently.  Then practiced gaze stabilization exercise with horizontal head movements x 10 head turns (= 35 sec).  Educated need to perform with head tipped down and performed again with proper head positioning.  Reported dizziness about 5/10.  Allowed to rest till back to baseline.  Then performed with vertical head movements and pt reported less symptoms.  Encouraged to do both directions for better vestibular adaptation.  Discussed how probable prolonged symptoms due to fluid shifts with dialysis.  Patient agreed and felt should normalize once at home.  Stated HHPT set up through [place].  Educated on progression to standing (though pt not up to it today,) but to allow HHPT to progress for safety.  Patient encouraged to attempt stairs for d/c assessment, but reported too fatigued due to not sleeping and had nausea and sleep medication.  Left in supine with bed alarm and call button in reach.   Therapy Documentation Precautions:  Precautions Precautions: Fall Restrictions Weight Bearing Restrictions: No Pain: Pain Assessment Pain Score: 0-No pain Exercises:   Other Treatments:      Therapy/Group: Individual Therapy  Reginia Naas 02/23/2019, 5:05 PM

## 2019-02-23 NOTE — Progress Notes (Signed)
Occupational Therapy Discharge Summary  Patient Details  Name: Heather [name] MRN: [id number] Date of Birth: [birth year]  Patient has met 8 of 8 long term goals due to improved activity tolerance, improved balance, postural control, ability to compensate for deficits and improved coordination.  Patient to discharge at overall supervision- Modified Independent level using RW.  Patient reports that mother and husband are able to provide assistance as needed at d/c. [name] able to complete basic ADL tasks at mod I level when feeling nauseous/ dizzy.  Pt has been limited in participation on IPR due to hypotensive episodes and resulting symptoms as well as vestibular deficits leading to dizziness and nausea. Education has been provided regarding modified ADLs and modifying activity based on symptoms.  [name] completing bathing task at sink level as she did PTA.  Recommendation:  Patient will benefit from ongoing skilled OT services in outpatient setting to continue to advance functional skills in the area of BADL, iADL and Reduce care partner burden.  Equipment: Pt has all needed DME  Reasons for discharge: treatment goals met and discharge from hospital  Patient/family agrees with progress made and goals achieved: Yes  OT Discharge Precautions/Restrictions  Precautions Precautions: Fall Restrictions Weight Bearing Restrictions: No Perception  Perception: Within Functional Limits Praxis Praxis: Intact Cognition Overall Cognitive Status: Within Functional Limits for tasks assessed Arousal/Alertness: Suspect due to medications Orientation Level: Oriented X4 Sustained Attention: Appears intact Memory: Appears intact Awareness: Appears intact Problem Solving: Appears intact Safety/Judgment: Appears intact Sensation Sensation Light Touch: Impaired Detail Peripheral sensation comments: peripheral neuropathy Light Touch Impaired Details: Impaired RLE;Impaired LLE;Impaired  RUE;Impaired LUE Proprioception: Impaired Detail Proprioception Impaired Details: Impaired RLE;Impaired LLE Coordination Gross Motor Movements are Fluid and Coordinated: No Fine Motor Movements are Fluid and Coordinated: Yes Coordination and Movement Description: limited by generalized weakness and neuropathy and hx R hemiparesis Motor  Motor Motor: Hemiplegia Motor - Discharge Observations: 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 Trunk/Postural Assessment  Cervical Assessment Cervical Assessment: Exceptions to WFL(Forward head) Thoracic Assessment Thoracic Assessment: Exceptions to WFL(Rounded shoulders; kyphotic) Lumbar Assessment Lumbar Assessment: Within Functional Limits Postural Control Postural Control: Deficits on evaluation  Balance Balance Balance Assessed: Yes Static Sitting Balance Static Sitting - Balance Support: No upper extremity supported;Feet supported Static Sitting - Level of Assistance: 7: Independent Dynamic Sitting Balance Dynamic Sitting - Balance Support: No upper extremity supported;Feet supported;During functional activity Dynamic Sitting - Level of Assistance: 7: Independent Sitting balance - Comments: Sitting EOB to don shoes Static Standing Balance Static Standing - Balance Support: During functional activity;Right upper extremity supported;Left upper extremity supported Static Standing - Level of Assistance: 6: Modified independent (Device/Increase time) Dynamic Standing Balance Dynamic Standing - Balance Support: During functional activity;Right upper extremity supported;Left upper extremity supported Dynamic Standing - Level of Assistance: 6: Modified independent (Device/Increase time) Dynamic Standing - Comments: Standing to complete bathing/toileting tasks Extremity/Trunk Assessment RUE Assessment RUE Assessment: Exceptions to [facility] General Strength Comments: 4/5 throughout 2/2 L CVA in 2019. Grasp WFL LUE Assessment LUE Assessment: Within  Functional Limits   Heather [name] L 02/23/2019, 3:35 PM

## 2019-02-23 NOTE — Discharge Instructions (Signed)
Inpatient Rehab Discharge Instructions  [facility] Discharge date and time: 02/23/19    Activities/Precautions/ Functional Status: Activity: no lifting, driving, or strenuous exercise till cleared by MD Diet: diabetic diet Wound Care: keep wound clean and dry   Functional status:  ___ No restrictions     ___ Walk up steps independently ___ 24/7 supervision/assistance   ___ Walk up steps with assistance _X__ Intermittent supervision/assistance  ___ Bathe/dress independently ___ Walk with walker     ___ Bathe/dress with assistance ___ Walk Independently    ___ Shower independently ___ Walk with assistance    ___ Shower with assistance _X__ No alcohol     ___ Return to work/school ________   Special Instructions: 1. Monitor blood sugars before meals and use sliding scale insulin per home regimen.   COMMUNITY REFERRALS UPON DISCHARGE:    Outpatient: PT & OT  Agency:[facility] Phone:[phone]   Date of Last Service:02/24/2019  Appointment Date/Time:MARCH 10-TUESDAY 3:30-4:45-PT AND MARCH 12-THURSDAY 11:00-11:45 FOR OT  Medical Equipment/Items Ordered:HAS ALL EQUIPMENT FROM PREVIOUS ADMITS      My questions have been answered and I understand these instructions. I will adhere to these goals and the provided educational materials after my discharge from the hospital.  Patient/Caregiver Signature _______________________________ Date __________  Clinician Signature _______________________________________ Date __________  Please bring this form and your medication list with you to all your follow-up doctor's appointments.

## 2019-02-23 NOTE — Progress Notes (Signed)
Social Work Discharge Note  The overall goal for the admission was met for: DC SAT 2/29  Discharge location: [place] HE IS WORKING HER MOM WILL BE THERE WITH HER-24 HR  Length of Stay: Yes-11 DAYS  Discharge activity level: Yes-INDEPENDENT WITH DEVICE  Home/community participation: Yes  Services provided included: MD, RD, PT, OT, RN, CM, TR, Pharmacy, Neuropsych and SW  Financial Services: Private Insurance: [employer]  Follow-up services arranged: Outpatient: [facility]-PT & OT WILL CONTACT PATIENT TO SET UP APPOINTMENTS MARCH 10 @ 3:30-4:45-PT AND MARCH 12 @ 11:00-12:00-OT  Comments (or additional information):PT DID WELL AND REACHED GOALS OF INDEPENDENT, HER MOM WILL BE THERE WHILE HER HUSBAND WORKS AND CAN TRANSPORT TO OP THERAPIES AND APPOINTMENTS.  Patient/Family verbalized understanding of follow-up arrangements: Yes  Individual responsible for coordination of the follow-up plan: [place]  Confirmed correct DME delivered: Elease Hashimoto 02/23/2019    Elease Hashimoto

## 2019-02-24 LAB — GLUCOSE, CAPILLARY: Glucose-Capillary: 310 mg/dL — ABNORMAL HIGH (ref 70–99)

## 2019-02-24 NOTE — Progress Notes (Signed)
Patient discharge information given 02/23/19. Patient discharged today. All questions answered. Patient wheeled down to car by nurse tech with all personal belongings. Nicholes Rough, LPN

## 2019-02-24 NOTE — Progress Notes (Signed)
Subjective: Interval History: has no complaint, PD going well , anxious to go home.  Objective: Vital signs in last 24 hours: Temp:  [97.6 F (36.4 C)-98.8 F (37.1 C)] 97.6 F (36.4 C) (02/29 0514) Pulse Rate:  [88-97] 89 (02/29 0514) Resp:  [12-20] 18 (02/29 0514) BP: (93-133)/(54-67) 133/61 (02/29 0514) SpO2:  [97 %-99 %] 97 % (02/29 0514) Weight:  [100.9 kg-103.4 kg] 103.4 kg (02/29 0652) Weight change: -1.3 kg  Intake/Output from previous day: 02/28 0701 - 02/29 0700 In: 12306 [P.O.:300] Out: 11929  Intake/Output this shift: No intake/output data recorded.  General appearance: alert, cooperative, no distress and moderately obese Resp: diminished breath sounds bilaterally Cardio: S1, S2 normal and systolic murmur: systolic ejection 2/6, crescendo and decrescendo at 2nd left intercostal space GI: pos bs, liver down 5 cm, pos FW,  Extremities: extremities normal, atraumatic, no cyanosis or edema  Lab Results: Recent Labs    02/23/19 0646  WBC 10.4  HGB 10.4*  HCT 33.0*  PLT PLATELET CLUMPS NOTED ON SMEAR, COUNT APPEARS ADEQUATE   BMET: No results for input(s): NA, K, CL, CO2, GLUCOSE, BUN, CREATININE, CALCIUM in the last 72 hours. No results for input(s): PTH in the last 72 hours. Iron Studies: No results for input(s): IRON, TIBC, TRANSFERRIN, FERRITIN in the last 72 hours.  Studies/Results: No results found.  I have reviewed the patient's current medications.  Assessment/Plan: 1 ESRD PD going well, new Rx 2 Anemia stable 3 HPTH vit D 4 Obesity 5 DM bs ^ this am.  6 Debill P ok to d/c, spoke with HT re new Rx    LOS: 11 days   Jeneen Rinks Kevante Lunt 02/24/2019,7:34 AM

## 2019-02-24 NOTE — Progress Notes (Signed)
[place] PHYSICAL MEDICINE & REHABILITATION PROGRESS NOTE  Subjective/Complaints: .On PD, no abd pain, feels ready for d/c  ROS: Denies CP, shortness of breath, nausea, vomiting, diarrhea.  Objective: Vital Signs: Blood pressure 133/61, pulse 89, temperature 97.6 F (36.4 C), temperature source Oral, resp. rate 18, height 5\' 9"  (1.753 m), weight 103.4 kg, SpO2 97 %. No results found. Recent Labs    02/23/19 0646  WBC 10.4  HGB 10.4*  HCT 33.0*  PLT PLATELET CLUMPS NOTED ON SMEAR, COUNT APPEARS ADEQUATE   No results for input(s): NA, K, CL, CO2, GLUCOSE, BUN, CREATININE, CALCIUM in the last 72 hours.  Physical Exam: BP 133/61 (BP Location: Right Arm)   Pulse 89   Temp 97.6 F (36.4 C) (Oral)   Resp 18   Ht 5\' 9"  (1.753 m)   Wt 103.4 kg   SpO2 97%   BMI 33.66 kg/m  Constitutional: No distress . Vital signs reviewed.  Obese. HENT: Normocephalic.  Atraumatic. Eyes: EOMI. No discharge. Cardiovascular: RRR.  No JVD.  + Murmur. Respiratory: CTA bilaterally.  Normal effort. GI: BS +. Non-distended.  + PD cath Musc: No edema or tenderness in extremities. Neurological: Alert. Motor: Grossly 4+/5 throughout, stable Skin: She isnot diaphoretic.  Psychiatric: Flat.  Assessment/Plan: 1. Functional deficits secondary to debility Stable for D/C today F/u PCP in 3-4 weeks F/u PM&R 2 weeks See D/C summary See D/C instructions Care Tool:  Bathing  Bathing activity did not occur: Refused Body parts bathed by patient: Right arm, Left upper leg, Left arm, Right lower leg, Chest, Left lower leg, Abdomen, Front perineal area, Face, Buttocks, Right upper leg         Bathing assist Assist Level: Independent with assistive device Assistive Device Comment: Sitting on rollator   Upper Body Dressing/Undressing Upper body dressing   What is the patient wearing?: Pull over shirt    Upper body assist Assist Level: Independent    Lower Body Dressing/Undressing Lower body  dressing      What is the patient wearing?: Underwear/pull up, Pants     Lower body assist Assist for lower body dressing: Independent with assitive device Assistive Device Comment: steadying self on sink to pull pants up   [place] assist Assist for toileting: Independent with assistive device Assistive Device Comment: Rollator   Transfers Chair/bed transfer  Transfers assist  Chair/bed transfer activity did not occur: N/A  Chair/bed transfer assist level: Independent with assistive device     Locomotion Ambulation   Ambulation assist      Assist level: Supervision/Verbal cueing Assistive device: Orthosis(bil Foot Ups) Max distance: 50   Walk 10 feet activity   Assist     Assist level: Supervision/Verbal cueing Assistive device: Walker-rolling   Walk 50 feet activity   Assist Walk 50 feet with 2 turns activity did not occur: Safety/medical concerns  Assist level: Supervision/Verbal cueing Assistive device: Walker-rolling, Orthosis    Walk 150 feet activity   Assist Walk 150 feet activity did not occur: Safety/medical concerns(dizziness/vertigo)  Assist level: Contact Guard/Touching assist Assistive device: Rollator, Orthosis    Walk 10 feet on uneven surface  activity   Assist Walk 10 feet on uneven surfaces activity did not occur: Safety/medical concerns(dizziness/vertigo)         Wheelchair     Assist Will patient use wheelchair at discharge?: No Type of Wheelchair: Manual    Wheelchair assist level: Supervision/Verbal cueing Max wheelchair distance: 60    Wheelchair 50 feet  with 2 turns activity    Assist        Assist Level: Supervision/Verbal cueing   Wheelchair 150 feet activity     Assist     Assist Level: Supervision/Verbal cueing      Medical Problem List and Plan: 1.Functional declinesecondary todebilityafter PD relarted peritonitis/sepsis, 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 DVT Prophylaxis/Anticoagulation: Pharmaceutical:Heparin 3.Chronic back pain/Pain Management:No baclofen or muscle relaxers per Nephrology. ContinueTramadol as needed 4. Mood:LCSW to follow for evaluation and support 5. Neuropsych: This patientiscapable of making decisions on herown behalf. 6. Skin/Wound Care:Routine pressure relief measures. 7. Fluids/Electrolytes/Nutrition:Monitor I's and O's.  Filed Weights   02/23/19 0945 02/23/19 1844 02/24/19 0652  Weight: 100.9 kg 103.2 kg 103.4 kg   Stable on 2/28 8. ESRD:Continue PD in the evenings. Nephrology to follow for PDassistance/management and correction of electrolyte abnormalities.[name]  9.PD associated peritonitis: Completed course of vancomycin/Fortazon 2/22.   10.T2DM:   Lantus(on Tresiba at [employer] with meal coverage, increased to 23 on 2/24  Sliding scale insulin for tighter blood sugar control.   NovoLog 3 units 3 times daily started on 2/21   CBG (last 3)  Recent Labs    02/23/19 1718 02/23/19 2123 02/24/19 0657  GLUCAP 162* 157* 310*  elevated this am, pt to resume tresiba at home which has peak plasma time of 9h  Monitor with increased mobility 11.Chronic constipation: Encourage compliance with bowel program.  12. Anemia of chronic disease:On Arnaesp weekly. Continue to monitor with serial checks.   Hemoglobin 10.7 on 2/26  Hemoccult negative  Continue to monitor 13. Neuropathy: Gabapentin changed to 100 mg daily on 2/19 14. Bacterial vaginosis: Completed course of Flagyl on 2/21 15. HTN: Monitor BP bid. On coreg bid.  Labile, but relatively controlled on 2/28  Monitor with increased mobility 16. Persistent Hypokalemia: Recs per nephro 17.  Pruritus  Benadryl cream ordered on  2/19  Resolved 18.  Nausea  Suspect related to multiple antibiotics + PD  Encouraged PRN medications  Improved overall 19.  Leukocytosis  WBCs 11.6 on 2/26  Labs pending  Afebrile  Continue to monitor   LOS: 11 days A FACE TO FACE EVALUATION WAS PERFORMED  Charlett Blake 02/24/2019, 7:40 AM

## 2019-02-27 ENCOUNTER — Telehealth: Payer: Self-pay | Admitting: Registered Nurse

## 2019-02-27 NOTE — Telephone Encounter (Signed)
Transitional Care call  Patient name: Heather Gadea- DavisDOB: August 17, 1971 1. Are you/is patient experiencing any problems since coming home? No a. Are there any questions regarding any aspect of care? No 2. Are there any questions regarding medications administration/dosing? No a. Are meds being taken as prescribed? Yes b. "Patient should review meds with caller to confirm" Medication List Reviewed 3. Have there been any falls? No 4. Has Home Health been to the house and/or have they contacted you? She has not heard from [facility], will call them in the morning.  a. If not, have you tried to contact them? No b. Can we help you contact them? Yes 5. Are bowels and bladder emptying properly? Yes a. Are there any unexpected incontinence issues? No b. If applicable, is patient following bowel/bladder programs? No 6. Any fevers, problems with breathing, unexpected pain? No 7. Are there any skin problems or new areas of breakdown? No 8. Has the patient/family member arranged specialty MD follow up (ie cardiology/neurology/renal/surgical/etc.)?  She has to call Dr. Donavan Foil for [place] appointment, she states she will call to make an appointment.  a. Can we help arrange? NA 9. Does the patient need any other services or support that we can help arrange? No 10. Are caregivers following through as expected in assisting the patient? Yes 11. Has the patient quit smoking, drinking alcohol, or using drugs as recommended? [name] states she doesn't smoke, drink alcohol or use illicit drugs.   Appointment date/time 03/05/2019  arrival time 1:40 for 2:00 appointment with Danella Sensing ANP-C. At [place]

## 2019-02-28 ENCOUNTER — Ambulatory Visit (INDEPENDENT_AMBULATORY_CARE_PROVIDER_SITE_OTHER): Payer: Managed Care, Other (non HMO) | Admitting: Family Medicine

## 2019-02-28 ENCOUNTER — Encounter: Payer: Self-pay | Admitting: Family Medicine

## 2019-02-28 ENCOUNTER — Telehealth: Payer: Self-pay | Admitting: Registered Nurse

## 2019-02-28 VITALS — BP 122/78 | HR 102 | Temp 97.9°F | Ht 69.0 in | Wt 231.8 lb

## 2019-02-28 DIAGNOSIS — Z992 Dependence on renal dialysis: Secondary | ICD-10-CM | POA: Diagnosis not present

## 2019-02-28 DIAGNOSIS — Z8719 Personal history of other diseases of the digestive system: Secondary | ICD-10-CM

## 2019-02-28 DIAGNOSIS — Z794 Long term (current) use of insulin: Secondary | ICD-10-CM

## 2019-02-28 DIAGNOSIS — R5381 Other malaise: Secondary | ICD-10-CM | POA: Diagnosis not present

## 2019-02-28 DIAGNOSIS — E119 Type 2 diabetes mellitus without complications: Secondary | ICD-10-CM | POA: Diagnosis not present

## 2019-02-28 DIAGNOSIS — Z8673 Personal history of transient ischemic attack (TIA), and cerebral infarction without residual deficits: Secondary | ICD-10-CM

## 2019-02-28 NOTE — Progress Notes (Signed)
Heather [name] is a 48 y.o. female is here for follow up.  Assessment and Plan:   Peritoneal dialysis catheter in place [facility]) Followed closely by [facility]. Had labs already this week. Will hold on recheck. Patient is compliant and motivated to continue PD.   History of peritonitis Resolved.   History of cerebrovascular accident (CVA) involving cerebellum With ongoing weakness. Rehab helpful. Foot drop brace bilateral. Still walking with some assist/supervision.  Diabetes mellitus, type II, insulin dependent ([place]), on CGM, with end organ damage: renal, neuropathy, gastroparesis, retinopathy Monitoring BG. No concerns. Compliant with current medications.   Subjective:   HPI Discharge summary reviewed. 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  She was found to have bacterial peritonitis and started on IV antibiotics as well as IV pain meds.  Wet prep was positive for bacterial vaginosis and she was started on Flagyl but this was DC'd due to ongoing issues with nausea.  Pelvic ultrasound revealed 2.8 cm left ovarian cyst and follow-up ultrasound recommended in 6 to 12 weeks.  On 2/11, she arrested requiring CODE BLUE and CPR to resuscitate his past dose of IV Dilaudid.  Monitor Narcan and EKG showed no acute findings.  PD fluid now grossly clear and cultures negative.  Episode of increasing abdominal pain with shortness of breath on 2/17 and V/Q scan done revealing low probability for PE.  Patient had resolved and abdominal pain was improving.  Therapy was ongoing but patient was noted to have significant balance deficits as well as limitations due to debility.  Care was recommended for follow-up  therapy.  Heather [name] was admitted to rehab 02/13/2019 for inpatient therapies to consist of PT and OT at least three hours five days a week. Past admission physiatrist, therapy team and rehab RN have worked together to provide customized collaborative inpatient rehab.  She completed 2 week course of vancomycin and Fortaz through 2/22.  Serial CBC shows anemia of chronic disease stable on weekly aranesp. She has refused fosrenol an diet was downgraded to renal with FR per nephrology who has been following for management of PD.  Exchange rates were adjusted due to SOB with higher volume dwells. PD catheter site is dry without signs of infection/irritation.  She has had issues with constipation but has required encouragement to adhere to bowel program.  She completed 5 day course of flagyl for treatment of bacterial vaginosis without SE. Gabapentin was resumed at 100 mg daily to help manage neuropathy. Blood pressures have been relatively controlled on coreg. Diabetes has been monitored with ac/hs CBG checks and Lantus was titrated upwards towards home dose. Po intake has improved and she was encouraged to continue to use protein supplements to help with low protein stores. Her endurance levels are improving and she has progressed to supervision level. She will continue to receive follow up outpatient PT and OT at [facility] after discharge.   Rehab course: During patient's stay in rehab weekly team conference was held to monitor patient's progress, set goals and discuss barriers to discharge. At admission, patient required min assist with mobility and basic self care tasks.  She  has had improvement in activity tolerance, balance, postural control as well as ability to compensate for deficits.  She  is able to complete ADL tasks at modified independent to supervision level. She requires supervision with mobility.  Family education completed regarding assistance needed.   Health Maintenance:  There are no  preventive care reminders to display for this patient. Depression screen [facility] 2/9 01/02/2018 12/08/2017 06/30/2017  Decreased Interest 1 1 0  Down, Depressed, Hopeless 0 3 0  PHQ - 2 Score 1 4 0  Altered sleeping 3 3 -  Tired, decreased energy 3 2 -  Change in appetite 1 3 -  Feeling bad or failure about yourself  0 2 -  Trouble concentrating 0 1 -  Moving slowly or fidgety/restless 1 1 -  Suicidal thoughts 0 1 -  PHQ-9 Score 9 17 -  Some recent data might be hidden   PMHx, SurgHx, SocialHx, FamHx, Medications, and Allergies were reviewed in the Visit Navigator and updated as appropriate.   Patient Active Problem List   Diagnosis Date Noted  . History of peritonitis 03/04/2019  . Leucocytosis   . Neuropathic pain   . Renovascular hypertension   . Anemia of chronic disease   . Diabetes mellitus type 2 in obese ([place])   . ESRD on dialysis ([place])   . Bacterial peritonitis ([place])   . Debility 02/13/2019  . Peritonitis, dialysis-associated, initial encounter ([place]) 02/05/2019  . Peritonitis, dialysis-associated ([place]) 02/05/2019  . Left ovarian cyst 02/05/2019  . Hemiplegia and hemiparesis following cerebral infarction affecting left non-dominant side ([place]) 12/29/2018  . Peritoneal dialysis catheter in place [facility]) 11/14/2018  . Pre-transplant evaluation for kidney transplant 11/07/2018  . ESRD with anemia ([place])   . ESRD (end stage renal disease) on dialysis ([place])   . Grade I diastolic dysfunction 82/95/6213  . Arteriovenous fistula ([place]) 07/21/2018  . Type 2 diabetes mellitus with end-stage renal disease ([place]) 07/21/2018  . History of cerebrovascular accident (CVA) involving cerebellum 07/21/2018  . Cyst of left ovary 01/12/2018  . Dysmenorrhea 01/12/2018  . Insomnia 01/12/2018  . Menorrhagia 01/12/2018  . Vitamin D deficiency 01/02/2018  . Bilateral lower extremity edema 01/02/2018  . Reactive depression 12/10/2017  . Arthralgia of right temporomandibular joint 10/24/2017  .  Antiplatelet or antithrombotic long-term use: Plavix 06/30/2017  . Morbid obesity due to excess calories ([place]) 11/16/2016  . Upper airway cough syndrome 11/15/2016  . DM (diabetes mellitus), type 2, uncontrolled, with renal complications ([place]) 08/65/7846  . Dyslipidemia associated with type 2 diabetes mellitus ([place]) 06/25/2016  . Uncontrolled type 2 diabetes mellitus with both eyes affected by proliferative retinopathy and macular edema, with long-term current use of insulin ([place]) 06/22/2016  . Uncontrolled type 2 diabetes mellitus with diabetic polyneuropathy, with long-term current use of insulin ([place]) 03/08/2016  . Proliferative diabetic retinopathy ([place]) 09/29/2015  . Constipation 09/03/2015  . Pseudophakia of both eyes 05/30/2015  . 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 B12 deficiency 05/10/2014  . Anemia associated with chronic renal failure, [place] 04/26/2014  . CKD (chronic kidney disease) stage 5, GFR less than 15 ml/min ([facility]) 04/26/2014  . Secondary hyperparathyroidism ([place]) 11/27/2013  . Posterior subcapsular cataract, bilateral 10/18/2013  . Diabetic neuropathy, painful ([place]), on low dose Gabapentin 07/13/2013  . Diabetes mellitus, type II, insulin dependent ([place]), on CGM, with end organ damage: renal, neuropathy, gastroparesis, retinopathy 02/19/2011  . Hypertension associated with diabetes ([place]) 02/19/2011  . Gastroparesis due to DM ([place]) 02/19/2011   Social History   Tobacco Use  . Smoking status: Never Smoker  . Smokeless tobacco: Never Used  Substance Use Topics  .  Alcohol use: No    Alcohol/week: 0.0 standard drinks  . Drug use: No   Current Medications and Allergies:   Current Outpatient Medications:  .  acetaminophen (TYLENOL) 325 MG tablet, Take 1-2 tablets (325-650 mg total) by mouth every 4 (four) hours as needed for mild pain., Disp: , Rfl:  .  clopidogrel (PLAVIX) 75 MG tablet, Take 1 tablet by mouth daily., Disp: , Rfl:  .  gabapentin  (NEURONTIN) 100 MG capsule, Take 1 capsule (100 mg total) by mouth daily., Disp: 30 capsule, Rfl: 0 .  gentamicin cream (GARAMYCIN) 0.1 %, Apply 1 application topically daily., Disp: 30 g, Rfl: 0 .  insulin glargine (LANTUS) 100 UNIT/ML injection, Inject 0.23 mLs (23 Units total) into the skin daily with lunch., Disp: 10 mL, Rfl: 11 .  lanthanum (FOSRENOL) 1000 MG chewable tablet, Chew 1 tablet (1,000 mg total) by mouth 3 (three) times daily with meals., Disp: , Rfl:  .  lidocaine-prilocaine (EMLA) cream, Apply 1 application topically as directed. For Dialysis use, Disp: , Rfl: 11 .  lubiprostone (AMITIZA) 24 MCG capsule, Take 1 capsule (24 mcg total) by mouth 2 (two) times daily with a meal., Disp: , Rfl:  .  metolazone (ZAROXOLYN) 10 MG tablet, , Disp: , Rfl:  .  multivitamin (RENA-VIT) TABS tablet, Take 1 tablet by mouth at bedtime., Disp: 30 tablet, Rfl: 0 .  promethazine (PHENERGAN) 25 MG tablet, Take 1 tablet (25 mg total) by mouth every 12 (twelve) hours as needed for nausea., Disp: 30 tablet, Rfl: 0 .  protein supplement (RESOURCE BENEPROTEIN) POWD, Take 6 g by mouth 3 (three) times daily with meals., Disp: 1 Can, Rfl: 0 .  simvastatin (ZOCOR) 10 MG tablet, Take 0.5 tablets (5 mg total) by mouth at bedtime. 1/2 po q hs - GFR 14, Disp: 30 tablet, Rfl: 1 .  traZODone (DESYREL) 50 MG tablet, Take 0.5-1 tablets (25-50 mg total) by mouth at bedtime as needed for sleep., Disp: 30 tablet, Rfl: 0   Allergies  Allergen Reactions  . Ibuprofen Other (See Comments)    CKD stage 3. Should avoid.  . Dilaudid [Hydromorphone Hcl] Itching and Other (See Comments)    Can take with Benadryl.  . Tramadol Itching   Review of Systems   Pertinent items are noted in the HPI. Otherwise, ROS is negative.  Vitals:   Vitals:   02/28/19 1151  BP: 122/78  Pulse: (!) 102  Temp: 97.9 F (36.6 C)  TempSrc: Oral  SpO2: 98%  Weight: 231 lb 12.8 oz (105.1 kg)  Height: 5\' 9"  (1.753 m)     Body mass index  is 34.23 kg/m. Physical Exam:   General: Cooperative, alert and oriented, well developed, well nourished, in no acute distress. HEENT: Pupils equal round reactive light and extraocular movements intact. Conjunctivae and lids unremarkable.  Neck: No thyromegaly.  Cardiovascular: Regular rhythm. No murmurs appreciated.  Lungs: Normal work of breathing. Clear bilaterally without rales, rhonchi, or wheezing.  Abdomen: Soft, nontender. Normal bowel sounds. Extremities: No clubbing, cyanosis, erythema.  Skin: Warm and dry. Neurologic: No focal deficits.  Psychiatric: Normal affect and thought content.   . Reviewed expectations re: course of current medical issues. . Discussed self-management of symptoms. . Outlined signs and symptoms indicating need for more acute intervention. . Patient verbalized understanding and all questions were answered. [name] Health Maintenance issues including appropriate healthy diet, exercise, and smoking avoidance were discussed with patient. . See orders for this visit as documented in the electronic  medical record. . Patient received an After Visit Summary.  Briscoe Deutscher, DO [place], [facility] 03/04/2019

## 2019-02-28 NOTE — Telephone Encounter (Signed)
This Provider called [facility] to verify [name] appointment. [name] scheduled for 03/06/2019. Left message for [name]. [facility] phone number [phone], address [address], [place].

## 2019-03-04 DIAGNOSIS — Z8719 Personal history of other diseases of the digestive system: Secondary | ICD-10-CM | POA: Insufficient documentation

## 2019-03-04 NOTE — Assessment & Plan Note (Signed)
Followed closely by [facility]. Had labs already this week. Will hold on recheck. Patient is compliant and motivated to continue PD.

## 2019-03-04 NOTE — Assessment & Plan Note (Signed)
With ongoing weakness. Rehab helpful. Foot drop brace bilateral. Still walking with some assist/supervision.

## 2019-03-04 NOTE — Assessment & Plan Note (Signed)
Resolved

## 2019-03-04 NOTE — Assessment & Plan Note (Signed)
Monitoring BG. No concerns. Compliant with current medications.

## 2019-03-05 ENCOUNTER — Encounter: Payer: Managed Care, Other (non HMO) | Attending: Registered Nurse | Admitting: Registered Nurse

## 2019-03-07 ENCOUNTER — Encounter: Payer: Managed Care, Other (non HMO) | Admitting: Physical Medicine & Rehabilitation

## 2019-03-15 NOTE — Progress Notes (Signed)
Heather [name] is a 48 y.o. female is here for follow up.  History of Present Illness:   Heather [name], CMA acting as scribe for Dr. Briscoe [name].   HPI: Patient has had increased dizziness after discharge from hospital. States that her blood pressure has continued to drop. Patient states at home is has gotten as low as 85/58. She has had nausea and vomiting. She has virtual visit with nephrology on Monday.   No appetite, constant nausea. No change in BM. Has chronic generalized ttp of abdomen, but this has not worsened. PD going well per patient.   Drinking about 32 ounces of water each day. Yesterday only at one meal of rice and green beans.  See previous note for details on recent hospitalization for peritonitis.   Needs follow up re: ovarian cyst seen on [name] during hospitalization.   Health Maintenance Due  Topic Date Due  . URINE MICROALBUMIN  04/12/2019   Depression screen [facility] 2/9 01/02/2018 12/08/2017 06/30/2017  Decreased Interest 1 1 0  Down, Depressed, Hopeless 0 3 0  PHQ - 2 Score 1 4 0  Altered sleeping 3 3 -  Tired, decreased energy 3 2 -  Change in appetite 1 3 -  Feeling bad or failure about yourself  0 2 -  Trouble concentrating 0 1 -  Moving slowly or fidgety/restless 1 1 -  Suicidal thoughts 0 1 -  PHQ-9 Score 9 17 -  Some recent data might be hidden   PMHx, SurgHx, SocialHx, FamHx, Medications, and Allergies were reviewed in the Visit Navigator and updated as appropriate.   Patient Active Problem List   Diagnosis Date Noted  . History of peritonitis 03/04/2019  . Leucocytosis   . Neuropathic pain   . Renovascular hypertension   . Anemia of chronic disease   . Diabetes mellitus type 2 in obese ([place])   . ESRD on dialysis ([place])   . Bacterial peritonitis ([place])   . Debility 02/13/2019  . Peritonitis, dialysis-associated, initial encounter ([place]) 02/05/2019  . Peritonitis, dialysis-associated ([place]) 02/05/2019  . Left ovarian cyst 02/05/2019  .  Hemiplegia and hemiparesis following cerebral infarction affecting left non-dominant side ([place]) 12/29/2018  . Peritoneal dialysis catheter in place [facility]) 11/14/2018  . Pre-transplant evaluation for kidney transplant 11/07/2018  . ESRD with anemia ([place])   . ESRD (end stage renal disease) on dialysis ([place])   . Grade I diastolic dysfunction 46/65/9935  . Arteriovenous fistula ([place]) 07/21/2018  . Type 2 diabetes mellitus with end-stage renal disease ([place]) 07/21/2018  . History of cerebrovascular accident (CVA) involving cerebellum 07/21/2018  . Cyst of left ovary 01/12/2018  . Dysmenorrhea 01/12/2018  . Insomnia 01/12/2018  . Menorrhagia 01/12/2018  . Vitamin D deficiency 01/02/2018  . Bilateral lower extremity edema 01/02/2018  . Reactive depression 12/10/2017  . Arthralgia of right temporomandibular joint 10/24/2017  . Antiplatelet or antithrombotic long-term use: Plavix 06/30/2017  . Morbid obesity due to excess calories ([place]) 11/16/2016  . Upper airway cough syndrome 11/15/2016  . DM (diabetes mellitus), type 2, uncontrolled, with renal complications ([place]) 70/17/7939  . Dyslipidemia associated with type 2 diabetes mellitus ([place]) 06/25/2016  . Uncontrolled type 2 diabetes mellitus with both eyes affected by proliferative retinopathy and macular edema, with long-term current use of insulin ([place]) 06/22/2016  . Uncontrolled type 2 diabetes mellitus with diabetic polyneuropathy, with long-term current use of insulin ([place]) 03/08/2016  . Proliferative diabetic retinopathy ([place]) 09/29/2015  . Constipation 09/03/2015  . Pseudophakia of both eyes  05/30/2015  . 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 B12 deficiency 05/10/2014  . Anemia associated with chronic renal failure, Procrit 04/26/2014  . CKD (chronic kidney disease) stage 5, GFR less than 15 ml/min (HCC) 04/26/2014  . Secondary hyperparathyroidism ([place]) 11/27/2013  . Posterior subcapsular cataract, bilateral 10/18/2013  .  Diabetic neuropathy, painful ([place]), on low dose Gabapentin 07/13/2013  . Diabetes mellitus, type II, insulin dependent ([place]), on CGM, with end organ damage: renal, neuropathy, gastroparesis, retinopathy 02/19/2011  . Hypertension associated with diabetes ([place]) 02/19/2011  . Gastroparesis due to DM ([place]) 02/19/2011   Social History   Tobacco Use  . Smoking status: Never Smoker  . Smokeless tobacco: Never Used  Substance Use Topics  . Alcohol use: No    Alcohol/week: 0.0 standard drinks  . Drug use: No   Current Medications and Allergies   .  acetaminophen (TYLENOL) 325 MG tablet, Take 1-2 tablets (325-650 mg total) by mouth every 4 (four) hours as needed for mild pain., Disp: , Rfl:  .  gabapentin (NEURONTIN) 100 MG capsule, Take 1 capsule (100 mg total) by mouth daily., Disp: 30 capsule, TAKING PRN .  gentamicin cream (GARAMYCIN) 0.1 %, Apply 1 application topically daily., Disp: 30 g, Rfl: 0 .  lanthanum (FOSRENOL) 1000 MG chewable tablet, Chew 1 tablet (1,000 mg total) by mouth 3 (three) times daily with meals., Disp: , Rfl:  .  lubiprostone (AMITIZA) 24 MCG capsule, Take 1 capsule (24 mcg total) by mouth 2 (two) times daily with a meal., Disp: , Rfl:  .  multivitamin (RENA-VIT) TABS tablet, Take 1 tablet by mouth at bedtime., Disp: 30 tablet, Rfl: 0 .  simvastatin (ZOCOR) 10 MG tablet, Take 0.5 tablets (5 mg total) by mouth at bedtime. 1/2 po q hs - GFR 14, Disp: 30 tablet, Rfl: 1   Allergies  Allergen Reactions  . Ibuprofen Other (See Comments)    CKD stage 3. Should avoid.  . Dilaudid [Hydromorphone Hcl] Itching and Other (See Comments)    Can take with Benadryl.  . Tramadol Itching   Review of Systems   Pertinent items are noted in the HPI. Otherwise, a complete ROS is negative.  Vitals   Vitals:   03/16/19 0835  BP: (!) 90/58  Pulse: 97  Temp: 98.8 F (37.1 C)  TempSrc: Oral  SpO2: 97%  Weight: 220 lb 3.2 oz (99.9 kg)  Height: 5\' 9"  (1.753 m)     Body mass  index is 32.52 kg/m.  Physical Exam   Physical Exam Vitals signs and nursing note reviewed.  Constitutional:      General: [name] not in acute distress.    Comments: Patient transitioned to wheelchair. Weak. Looks sleepy.   HENT:     Head: Normocephalic and atraumatic.     Nose: Nose normal.     Mouth/Throat:     Mouth: Mucous membranes are dry.  Eyes:     Extraocular Movements: Extraocular movements intact.     Conjunctiva/sclera: Conjunctivae normal.     Pupils: Pupils are equal, round, and reactive to light.  Neck:     Musculoskeletal: Normal range of motion and neck supple.  Cardiovascular:     Rate and Rhythm: Normal rate and regular rhythm.  Pulmonary:     Effort: Pulmonary effort is normal.  Abdominal:     General: There is no distension.     Palpations: Abdomen is soft.     Tenderness: There is abdominal tenderness.  Musculoskeletal:  Right lower leg: No edema.     Left lower leg: No edema.  Skin:    General: Skin is warm.  Neurological:     General: No focal deficit present.     Mental Status: [name] alert.  Psychiatric:        Mood and Affect: Mood normal.        Behavior: Behavior normal.        Thought Content: Thought content normal.        Judgment: Judgment normal.    Assessment and Plan   This pleasant patient with a history of end-stage renal disease secondary to diabetes, on peritoneal dialysis, presents with hypotension and dehydration.  Recent hospitalization for bacterial peritonitis.  Due to her high risk medical conditions and ill appearance today, attempted labs.  Unfortunately, techs were unable to obtain labs even with the use of ultrasound.  Discussed with patient.  She prefers to go to [facility].  Her son will drive her now and we will call ahead to the emergency department.  1. Lightheaded   2. Hypotension, unspecified hypotension type   3. History of peritonitis   4. Left ovarian cyst   5. Debility   6. Grade I diastolic  dysfunction   7. Type 2 diabetes mellitus with end-stage renal disease ([place])   8. History of cerebrovascular accident (CVA) involving cerebellum    Orders Placed This Encounter  Procedures  . Culture, blood (single) w Reflex to ID Panel  . Culture, blood (single) w Reflex to ID Panel  . Microalbumin / creatinine urine ratio  . CBC with Differential/Platelet  . Comprehensive metabolic panel  . Magnesium  . Phosphorus   . Reviewed expectations re: course of current medical issues. . Discussed self-management of symptoms. . Outlined signs and symptoms indicating need for more acute intervention. . Patient verbalized understanding and all questions were answered. [name] Health Maintenance issues including appropriate healthy diet, exercise, and smoking avoidance were discussed with patient. . See orders for this visit as documented in the electronic medical record. . Patient received an After Visit Summary.  Heather Deutscher, DO [place], [facility] 03/16/2019      CMA served as Education administrator during this visit. History, Physical, and Plan performed by medical provider. The above documentation has been reviewed and is accurate and complete. Heather [name], D.O.  Heather Deutscher, DO [place], [facility] 03/16/2019

## 2019-03-16 ENCOUNTER — Other Ambulatory Visit: Payer: Self-pay

## 2019-03-16 ENCOUNTER — Ambulatory Visit (INDEPENDENT_AMBULATORY_CARE_PROVIDER_SITE_OTHER): Payer: Managed Care, Other (non HMO) | Admitting: Family Medicine

## 2019-03-16 ENCOUNTER — Encounter: Payer: Self-pay | Admitting: Family Medicine

## 2019-03-16 VITALS — BP 90/58 | HR 97 | Temp 98.8°F | Ht 69.0 in | Wt 220.2 lb

## 2019-03-16 DIAGNOSIS — I959 Hypotension, unspecified: Secondary | ICD-10-CM | POA: Diagnosis not present

## 2019-03-16 DIAGNOSIS — N83202 Unspecified ovarian cyst, left side: Secondary | ICD-10-CM | POA: Diagnosis not present

## 2019-03-16 DIAGNOSIS — Z8719 Personal history of other diseases of the digestive system: Secondary | ICD-10-CM

## 2019-03-16 DIAGNOSIS — I519 Heart disease, unspecified: Secondary | ICD-10-CM

## 2019-03-16 DIAGNOSIS — I5189 Other ill-defined heart diseases: Secondary | ICD-10-CM

## 2019-03-16 DIAGNOSIS — Z8673 Personal history of transient ischemic attack (TIA), and cerebral infarction without residual deficits: Secondary | ICD-10-CM

## 2019-03-16 DIAGNOSIS — R42 Dizziness and giddiness: Secondary | ICD-10-CM

## 2019-03-16 DIAGNOSIS — E1122 Type 2 diabetes mellitus with diabetic chronic kidney disease: Secondary | ICD-10-CM

## 2019-03-16 DIAGNOSIS — R5381 Other malaise: Secondary | ICD-10-CM

## 2019-03-16 DIAGNOSIS — N186 End stage renal disease: Secondary | ICD-10-CM

## 2019-03-16 NOTE — Patient Instructions (Signed)
I am getting labs now. They should be back by this afternoon. If they look stable, I want you to call your Nephrologist so that we can coordinate fluids and monitoring. If they look concerning, I will send you to the Emergency Room and will call ahead to let them know that you are coming.   DRINK MORE WATER. EAT more regularly.

## 2019-03-19 ENCOUNTER — Telehealth: Payer: Self-pay | Admitting: Family Medicine

## 2019-03-19 LAB — TSH: TSH: 1.11 (ref <=5.90)

## 2019-03-19 NOTE — Telephone Encounter (Signed)
Copied from [place] [id number]. Topic: Quick Communication - See Telephone Encounter >> Mar 19, 2019 10:26 AM Burchel, Abbi R wrote: CRM for notification. See Telephone encounter for: 03/19/19.  Pt requesting a phone call back from Dr Alcario Drought nurse re: work note stating she can work from home via laptop.    Pt: [phone]

## 2019-03-19 NOTE — Telephone Encounter (Signed)
See note

## 2019-03-20 LAB — BASIC METABOLIC PANEL
BUN: 41 — AB (ref 4–21)
Creatinine: 8.1 — AB (ref 0.5–1.1)
Glucose: 221
Potassium: 2.9 — AB (ref 3.4–5.3)
Sodium: 133 — AB (ref 137–147)

## 2019-03-20 LAB — CBC AND DIFFERENTIAL
HCT: 34 — AB (ref 36–46)
Hemoglobin: 11.2 — AB (ref 12.0–16.0)
Platelets: 231 (ref 150–399)
WBC: 8.4

## 2019-03-20 NOTE — Telephone Encounter (Signed)
Sent mychart

## 2019-03-22 MED ORDER — MIDODRINE HCL 5 MG PO TABS
5.00 | ORAL_TABLET | ORAL | Status: DC
Start: 2019-03-20 — End: 2019-03-22

## 2019-03-22 MED ORDER — NYSTATIN 100000 UNIT/ML MT SUSP
500000.00 | OROMUCOSAL | Status: DC
Start: 2019-03-20 — End: 2019-03-22

## 2019-03-22 MED ORDER — COMPOUND W FREEZE OFF EX AERO
1.00 | INHALATION_SPRAY | CUTANEOUS | Status: DC
Start: 2019-03-20 — End: 2019-03-22

## 2019-03-22 MED ORDER — INSULIN GLARGINE 100 UNIT/ML SOLOSTAR PEN
30.00 | PEN_INJECTOR | SUBCUTANEOUS | Status: DC
Start: 2019-03-20 — End: 2019-03-22

## 2019-03-22 MED ORDER — INSULIN LISPRO 100 UNIT/ML ~~LOC~~ SOLN
6.00 | SUBCUTANEOUS | Status: DC
Start: 2019-03-20 — End: 2019-03-22

## 2019-03-22 MED ORDER — HYALOMATRIX EX MISC
1.00 | CUTANEOUS | Status: DC
Start: 2019-03-20 — End: 2019-03-22

## 2019-03-22 MED ORDER — GABAPENTIN 300 MG PO CAPS
300.00 | ORAL_CAPSULE | ORAL | Status: DC
Start: ? — End: 2019-03-22

## 2019-03-22 MED ORDER — CALCIUM ACETATE (PHOS BINDER) 667 MG PO CAPS
1334.00 | ORAL_CAPSULE | ORAL | Status: DC
Start: 2019-03-20 — End: 2019-03-22

## 2019-03-22 MED ORDER — HEPARIN SODIUM (PORCINE) 5000 UNIT/ML IJ SOLN
5000.00 | INTRAMUSCULAR | Status: DC
Start: 2019-03-20 — End: 2019-03-22

## 2019-03-22 MED ORDER — STRI-DEX MAXIMUM STRENGTH 2 % EX PADS
125.00 | MEDICATED_PAD | CUTANEOUS | Status: DC
Start: ? — End: 2019-03-22

## 2019-03-22 MED ORDER — BANDAGES MISC
1.00 | Status: DC
Start: ? — End: 2019-03-22

## 2019-03-23 ENCOUNTER — Other Ambulatory Visit: Payer: Self-pay

## 2019-03-23 ENCOUNTER — Encounter: Payer: Self-pay | Admitting: Family Medicine

## 2019-03-23 ENCOUNTER — Ambulatory Visit (INDEPENDENT_AMBULATORY_CARE_PROVIDER_SITE_OTHER): Payer: Managed Care, Other (non HMO) | Admitting: Family Medicine

## 2019-03-23 VITALS — BP 154/88 | HR 97 | Temp 98.8°F | Ht 69.0 in | Wt 230.8 lb

## 2019-03-23 DIAGNOSIS — D638 Anemia in other chronic diseases classified elsewhere: Secondary | ICD-10-CM

## 2019-03-23 DIAGNOSIS — Z992 Dependence on renal dialysis: Secondary | ICD-10-CM

## 2019-03-23 DIAGNOSIS — E876 Hypokalemia: Secondary | ICD-10-CM | POA: Diagnosis not present

## 2019-03-23 DIAGNOSIS — E119 Type 2 diabetes mellitus without complications: Secondary | ICD-10-CM

## 2019-03-23 DIAGNOSIS — N186 End stage renal disease: Secondary | ICD-10-CM | POA: Diagnosis not present

## 2019-03-23 DIAGNOSIS — Z8719 Personal history of other diseases of the digestive system: Secondary | ICD-10-CM

## 2019-03-23 DIAGNOSIS — I959 Hypotension, unspecified: Secondary | ICD-10-CM

## 2019-03-23 DIAGNOSIS — Z794 Long term (current) use of insulin: Secondary | ICD-10-CM

## 2019-03-23 MED ORDER — POTASSIUM CHLORIDE ER 10 MEQ PO TBCR: EXTENDED_RELEASE_TABLET | ORAL | 0 refills | Status: DC

## 2019-03-23 NOTE — Progress Notes (Signed)
Heather [name] is a 48 y.o. female is here for follow up.  History of Present Illness:   Heather [name], CMA acting as scribe for Dr. Briscoe [name].   HPI: Patient in for follow up from hospital. [name] feeling much better. She was given antibiotics and was given hydration. [name] going to nephrologist  to get her antibiotic with them. She knows that they drew blood when she was seen yesterday. She would like for [name] to call that office and see if we can get the results from them.   [name] doing much better still has little bit of weakness but is so much better.   Health Maintenance Due  Topic Date Due  . URINE MICROALBUMIN  04/12/2019   Depression screen [facility] 2/9 01/02/2018 12/08/2017 06/30/2017  Decreased Interest 1 1 0  Down, Depressed, Hopeless 0 3 0  PHQ - 2 Score 1 4 0  Altered sleeping 3 3 -  Tired, decreased energy 3 2 -  Change in appetite 1 3 -  Feeling bad or failure about yourself  0 2 -  Trouble concentrating 0 1 -  Moving slowly or fidgety/restless 1 1 -  Suicidal thoughts 0 1 -  PHQ-9 Score 9 17 -  Some recent data might be hidden   PMHx, SurgHx, SocialHx, FamHx, Medications, and Allergies were reviewed in the Visit Navigator and updated as appropriate.   Patient Active Problem List   Diagnosis Date Noted  . History of peritonitis 03/04/2019  . Leucocytosis   . Neuropathic pain   . Renovascular hypertension   . Anemia of chronic disease   . Diabetes mellitus type 2 in obese ([place])   . ESRD on dialysis ([place])   . Bacterial peritonitis ([place])   . Debility 02/13/2019  . Peritonitis, dialysis-associated, initial encounter ([place]) 02/05/2019  . Peritonitis, dialysis-associated ([place]) 02/05/2019  . Left ovarian cyst 02/05/2019  . Hemiplegia and hemiparesis following cerebral infarction affecting left non-dominant side ([place]) 12/29/2018  . Peritoneal dialysis catheter in place [facility]) 11/14/2018  . Pre-transplant evaluation for kidney transplant 11/07/2018  . ESRD  with anemia ([place])   . ESRD (end stage renal disease) on dialysis ([place])   . Grade I diastolic dysfunction 24/23/5361  . Arteriovenous fistula ([place]) 07/21/2018  . Type 2 diabetes mellitus with end-stage renal disease ([place]) 07/21/2018  . History of cerebrovascular accident (CVA) involving cerebellum 07/21/2018  . Cyst of left ovary 01/12/2018  . Dysmenorrhea 01/12/2018  . Insomnia 01/12/2018  . Menorrhagia 01/12/2018  . Vitamin D deficiency 01/02/2018  . Bilateral lower extremity edema 01/02/2018  . Reactive depression 12/10/2017  . Arthralgia of right temporomandibular joint 10/24/2017  . Antiplatelet or antithrombotic long-term use: Plavix 06/30/2017  . Morbid obesity due to excess calories ([place]) 11/16/2016  . Upper airway cough syndrome 11/15/2016  . DM (diabetes mellitus), type 2, uncontrolled, with renal complications ([place]) 44/31/5400  . Dyslipidemia associated with type 2 diabetes mellitus ([place]) 06/25/2016  . Uncontrolled type 2 diabetes mellitus with both eyes affected by proliferative retinopathy and macular edema, with long-term current use of insulin ([place]) 06/22/2016  . Uncontrolled type 2 diabetes mellitus with diabetic polyneuropathy, with long-term current use of insulin ([place]) 03/08/2016  . Proliferative diabetic retinopathy ([place]) 09/29/2015  . Constipation 09/03/2015  . Pseudophakia of both eyes 05/30/2015  . 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 B12 deficiency 05/10/2014  . Anemia associated with chronic renal failure, Procrit 04/26/2014  . CKD (chronic kidney disease) stage 5, GFR  less than 15 ml/min ([place]) 04/26/2014  . Secondary hyperparathyroidism ([place]) 11/27/2013  . Posterior subcapsular cataract, bilateral 10/18/2013  . Diabetic neuropathy, painful ([place]), on low dose Gabapentin 07/13/2013  . Diabetes mellitus, type II, insulin dependent ([place]), on CGM, with end organ damage: renal, neuropathy, gastroparesis, retinopathy 02/19/2011  . Hypertension associated  with diabetes ([place]) 02/19/2011  . Gastroparesis due to DM ([place]) 02/19/2011   Social History   Tobacco Use  . Smoking status: Never Smoker  . Smokeless tobacco: Never Used  Substance Use Topics  . Alcohol use: No    Alcohol/week: 0.0 standard drinks  . Drug use: No   Current Medications and Allergies   Current Outpatient Medications:  .  acetaminophen (TYLENOL) 325 MG tablet, Take 1-2 tablets (325-650 mg total) by mouth every 4 (four) hours as needed for mild pain., Disp: , Rfl:  .  gabapentin (NEURONTIN) 100 MG capsule, Take 1 capsule (100 mg total) by mouth daily., Disp: 30 capsule, Rfl: 0 .  gentamicin cream (GARAMYCIN) 0.1 %, Apply 1 application topically daily., Disp: 30 g, Rfl: 0 .  lanthanum (FOSRENOL) 1000 MG chewable tablet, Chew 1 tablet (1,000 mg total) by mouth 3 (three) times daily with meals., Disp: , Rfl:  .  lubiprostone (AMITIZA) 24 MCG capsule, Take 1 capsule (24 mcg total) by mouth 2 (two) times daily with a meal., Disp: , Rfl:  .  multivitamin (RENA-VIT) TABS tablet, Take 1 tablet by mouth at bedtime., Disp: 30 tablet, Rfl: 0 .  simvastatin (ZOCOR) 10 MG tablet, Take 0.5 tablets (5 mg total) by mouth at bedtime. 1/2 po q hs - GFR 14, Disp: 30 tablet, Rfl: 1   Allergies  Allergen Reactions  . Ibuprofen Other (See Comments)    CKD stage 3. Should avoid.  . Dilaudid [Hydromorphone Hcl] Itching and Other (See Comments)    Can take with Benadryl.  . Tramadol Itching   Review of Systems   Pertinent items are noted in the HPI. Otherwise, a complete ROS is negative.  Vitals   Vitals:   03/23/19 1539  BP: (!) 154/88  Pulse: 97  Temp: 98.8 F (37.1 C)  TempSrc: Oral  SpO2: 99%  Weight: 230 lb 12.8 oz (104.7 kg)  Height: 5\' 9"  (1.753 m)     Body mass index is 34.08 kg/m.  Physical Exam   Physical Exam Vitals signs and nursing note reviewed.  HENT:     Head: Normocephalic and atraumatic.     Nose: Nose normal.     Mouth/Throat:     Mouth: Mucous  membranes are moist.  Eyes:     Pupils: Pupils are equal, round, and reactive to light.  Neck:     Musculoskeletal: Normal range of motion and neck supple.  Cardiovascular:     Rate and Rhythm: Normal rate and regular rhythm.  Pulmonary:     Effort: Pulmonary effort is normal.  Abdominal:     Palpations: Abdomen is soft.  Musculoskeletal:     Right lower leg: No edema.     Left lower leg: No edema.  Skin:    General: Skin is warm.  Neurological:     Mental Status: [name] oriented to person, place, and time.     Gait: Gait abnormal.  Psychiatric:        Mood and Affect: Mood normal.        Behavior: Behavior normal.        Judgment: Judgment normal.    Assessment and Plan  Teira was seen today for hospitalization follow-up.  Diagnoses and all orders for this visit:  ESRD on peritoneal dialysis [facility]) -     Comprehensive metabolic panel; Future  Anemia of chronic disease -     CBC with Differential/Platelet; Future  Hypokalemia Comments: Not tolerating 20 meq tablet - will change to 10 meq. Orders: -     Magnesium; Future -     Phosphorus; Future -     potassium chloride (K-DUR) 10 MEQ tablet; 4 tab daily  History of peritonitis Comments: Still on Vanc twice weekly - goes to dialysis center. Had labs drawn yesterday - will have them faxed here.   Hypotension, unspecified hypotension type Comments: Now improved. She feels strong.  Diabetes mellitus, type II, insulin dependent ([place]), on CGM, with end organ damage: renal, neuropathy, gastroparesis, retinopathy   . Orders and follow up as documented in [place], reviewed diet, exercise and weight control, cardiovascular risk and specific lipid/LDL goals reviewed, reviewed medications and side effects in detail.  . Reviewed expectations re: course of current medical issues. . Outlined signs and symptoms indicating need for more acute intervention. . Patient verbalized understanding and all questions were  answered. . Patient received an After Visit Summary.  CMA served as Education administrator during this visit. History, Physical, and Plan performed by medical provider. The above documentation has been reviewed and is accurate and complete. Heather [name], D.O.  Heather Deutscher, DO [place], [facility] 03/23/2019

## 2019-03-23 NOTE — Patient Instructions (Signed)
You look great! We will follow up on the labs that you had drawn yesterday. Have a great and healthy weekend!

## 2019-03-23 NOTE — Progress Notes (Deleted)
Heather [name] is a 48 y.o. female is here for follow up.  History of Present Illness:   Heather [name], as CMA scribe.    HPI:   Health Maintenance Due  Topic Date Due  . URINE MICROALBUMIN  04/12/2019   Depression screen [facility] 2/9 01/02/2018 12/08/2017 06/30/2017  Decreased Interest 1 1 0  Down, Depressed, Hopeless 0 3 0  PHQ - 2 Score 1 4 0  Altered sleeping 3 3 -  Tired, decreased energy 3 2 -  Change in appetite 1 3 -  Feeling bad or failure about yourself  0 2 -  Trouble concentrating 0 1 -  Moving slowly or fidgety/restless 1 1 -  Suicidal thoughts 0 1 -  PHQ-9 Score 9 17 -  Some recent data might be hidden   PMHx, SurgHx, SocialHx, FamHx, Medications, and Allergies were reviewed in the Visit Navigator and updated as appropriate.   Patient Active Problem List   Diagnosis Date Noted  . History of peritonitis 03/04/2019  . Leucocytosis   . Neuropathic pain   . Renovascular hypertension   . Anemia of chronic disease   . Diabetes mellitus type 2 in obese ([place])   . ESRD on dialysis ([place])   . Bacterial peritonitis ([place])   . Debility 02/13/2019  . Peritonitis, dialysis-associated, initial encounter ([place]) 02/05/2019  . Peritonitis, dialysis-associated ([place]) 02/05/2019  . Left ovarian cyst 02/05/2019  . Hemiplegia and hemiparesis following cerebral infarction affecting left non-dominant side ([place]) 12/29/2018  . Peritoneal dialysis catheter in place [facility]) 11/14/2018  . Pre-transplant evaluation for kidney transplant 11/07/2018  . ESRD with anemia ([place])   . ESRD (end stage renal disease) on dialysis ([place])   . Grade I diastolic dysfunction 28/31/5176  . Arteriovenous fistula ([place]) 07/21/2018  . Type 2 diabetes mellitus with end-stage renal disease ([place]) 07/21/2018  . History of cerebrovascular accident (CVA) involving cerebellum 07/21/2018  . Cyst of left ovary 01/12/2018  . Dysmenorrhea 01/12/2018  . Insomnia 01/12/2018  . Menorrhagia 01/12/2018  . Vitamin D  deficiency 01/02/2018  . Bilateral lower extremity edema 01/02/2018  . Reactive depression 12/10/2017  . Arthralgia of right temporomandibular joint 10/24/2017  . Antiplatelet or antithrombotic long-term use: Plavix 06/30/2017  . Morbid obesity due to excess calories ([place]) 11/16/2016  . Upper airway cough syndrome 11/15/2016  . DM (diabetes mellitus), type 2, uncontrolled, with renal complications ([place]) 16/06/3709  . Dyslipidemia associated with type 2 diabetes mellitus ([place]) 06/25/2016  . Uncontrolled type 2 diabetes mellitus with both eyes affected by proliferative retinopathy and macular edema, with long-term current use of insulin ([place]) 06/22/2016  . Uncontrolled type 2 diabetes mellitus with diabetic polyneuropathy, with long-term current use of insulin ([place]) 03/08/2016  . Proliferative diabetic retinopathy ([place]) 09/29/2015  . Constipation 09/03/2015  . Pseudophakia of both eyes 05/30/2015  . 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 B12 deficiency 05/10/2014  . Anemia associated with chronic renal failure, [place] 04/26/2014  . CKD (chronic kidney disease) stage 5, GFR less than 15 ml/min (HCC) 04/26/2014  . Secondary hyperparathyroidism ([place]) 11/27/2013  . Posterior subcapsular cataract, bilateral 10/18/2013  . Diabetic neuropathy, painful ([place]), on low dose Gabapentin 07/13/2013  . Diabetes mellitus, type II, insulin dependent ([place]), on CGM, with end organ damage: renal, neuropathy, gastroparesis, retinopathy 02/19/2011  . Hypertension associated with diabetes ([place]) 02/19/2011  . Gastroparesis due to DM ([place]) 02/19/2011   Social History   Tobacco Use  . Smoking status: Never Smoker  . Smokeless tobacco:  Never Used  Substance Use Topics  . Alcohol use: No    Alcohol/week: 0.0 standard drinks  . Drug use: No   Current Medications and Allergies   Current Outpatient Medications:  .  acetaminophen (TYLENOL) 325 MG tablet, Take 1-2 tablets (325-650 mg total) by mouth  every 4 (four) hours as needed for mild pain., Disp: , Rfl:  .  gabapentin (NEURONTIN) 100 MG capsule, Take 1 capsule (100 mg total) by mouth daily., Disp: 30 capsule, Rfl: 0 .  gentamicin cream (GARAMYCIN) 0.1 %, Apply 1 application topically daily., Disp: 30 g, Rfl: 0 .  insulin degludec (TRESIBA FLEXTOUCH) 100 UNIT/ML SOPN FlexTouch Pen, Inject into the skin., Disp: , Rfl:  .  lanthanum (FOSRENOL) 1000 MG chewable tablet, Chew 1 tablet (1,000 mg total) by mouth 3 (three) times daily with meals., Disp: , Rfl:  .  lubiprostone (AMITIZA) 24 MCG capsule, Take 1 capsule (24 mcg total) by mouth 2 (two) times daily with a meal., Disp: , Rfl:  .  multivitamin (RENA-VIT) TABS tablet, Take 1 tablet by mouth at bedtime., Disp: 30 tablet, Rfl: 0 .  potassium chloride SA (K-DUR,KLOR-CON) 20 MEQ tablet, Take by mouth., Disp: , Rfl:  .  simvastatin (ZOCOR) 10 MG tablet, Take 0.5 tablets (5 mg total) by mouth at bedtime. 1/2 po q hs - GFR 14, Disp: 30 tablet, Rfl: 1 .  Specialty Vitamins Products (MG PLUS PROTEIN) 133 MG TABS, Take by mouth., Disp: , Rfl:  .  insulin aspart (NOVOLOG FLEXPEN) 100 UNIT/ML FlexPen, Inject into the skin., Disp: , Rfl:  .  potassium chloride (K-DUR) 10 MEQ tablet, 4 tab daily, Disp: 120 tablet, Rfl: 0   Allergies  Allergen Reactions  . Ibuprofen Other (See Comments)    CKD stage 3. Should avoid.  . Dilaudid [Hydromorphone Hcl] Itching and Other (See Comments)    Can take with Benadryl.  . Tramadol Itching   Review of Systems   Pertinent items are noted in the HPI. Otherwise, a complete ROS is negative.  Vitals   Vitals:   03/23/19 1539  BP: (!) 154/88  Pulse: 97  Temp: 98.8 F (37.1 C)  TempSrc: Oral  SpO2: 99%  Weight: 230 lb 12.8 oz (104.7 kg)  Height: 5\' 9"  (1.753 m)     Body mass index is 34.08 kg/m.  Physical Exam   Physical Exam  Assessment and Plan   Heather [name] was seen today for hospitalization follow-up.  Diagnoses and all orders for this  visit:  ESRD on peritoneal dialysis [facility]) -     Comprehensive metabolic panel; Future  Anemia of chronic disease -     CBC with Differential/Platelet; Future  Hypokalemia Comments: Not tolerating 20 meq tablet - will change to 10 meq. Orders: -     Magnesium; Future -     Phosphorus; Future  History of peritonitis Comments: Still on Vanc twice weekly - goes to dialysis center. Had labs drawn yesterday - will have them faxed here.   Hypotension, unspecified hypotension type Comments: Now improved. She feels strong.  Other orders -     potassium chloride (K-DUR) 10 MEQ tablet; 4 tab daily    . Orders and follow up as documented in [place], reviewed diet, exercise and weight control, cardiovascular risk and specific lipid/LDL goals reviewed, reviewed medications and side effects in detail.  . Reviewed expectations re: course of current medical issues. . Outlined signs and symptoms indicating need for more acute intervention. . Patient verbalized understanding and all  questions were answered. . Patient received an After Visit Summary.  CMA served as Education administrator during this visit. History, Physical, and Plan performed by medical provider. The above documentation has been reviewed and is accurate and complete. Briscoe Deutscher, D.O.  Briscoe Deutscher, DO [place], [facility] 03/23/2019

## 2019-03-27 ENCOUNTER — Other Ambulatory Visit: Payer: Self-pay | Admitting: Physical Medicine and Rehabilitation

## 2019-04-09 ENCOUNTER — Other Ambulatory Visit: Payer: Self-pay | Admitting: Family Medicine

## 2019-04-09 NOTE — Telephone Encounter (Signed)
See note

## 2019-04-09 NOTE — Telephone Encounter (Signed)
Requested medication (s) are due for refill today: Yes  Requested medication (s) are on the active medication list: Yes  Last refill:  02/23/19  Future visit scheduled: No  Notes to clinic:  Unable to refill, last refilled by another provider     Requested Prescriptions  Pending Prescriptions Disp Refills   simvastatin (ZOCOR) 10 MG tablet 30 tablet 1    Sig: Take 0.5 tablets (5 mg total) by mouth at bedtime. 1/2 po q hs - GFR 14     Cardiovascular:  Antilipid - Statins Failed - 04/09/2019 11:04 AM      Failed - Total Cholesterol in normal range and within 360 days    Cholesterol, Total  Date Value Ref Range Status  02/13/2013 277  Final   Cholesterol  Date Value Ref Range Status  12/03/2017 249 (H) 0 - 200 mg/dL Final         Failed - LDL in normal range and within 360 days    LDL (calc)  Date Value Ref Range Status  02/13/2013 195  Final   LDL Cholesterol  Date Value Ref Range Status  12/03/2017 169 (H) 0 - 99 mg/dL Final    Comment:           Total Cholesterol/HDL:CHD Risk Coronary Heart Disease Risk Table                     Men   Women  1/2 Average Risk   3.4   3.3  Average Risk       5.0   4.4  2 X Average Risk   9.6   7.1  3 X Average Risk  23.4   11.0        Use the calculated Patient Ratio above and the CHD Risk Table to determine the patient's CHD Risk.        ATP III CLASSIFICATION (LDL):  <100     mg/dL   Optimal  100-129  mg/dL   Near or Above                    Optimal  130-159  mg/dL   Borderline  160-189  mg/dL   High  >190     mg/dL   Very High          Failed - HDL in normal range and within 360 days    HDL  Date Value Ref Range Status  12/03/2017 48 >40 mg/dL Final         Failed - Triglycerides in normal range and within 360 days    Triglycerides  Date Value Ref Range Status  12/03/2017 158 (H) <150 mg/dL Final  02/13/2013 200  Final         Passed - Patient is not pregnant      Passed - Valid encounter within last 12  months    Recent Outpatient Visits          2 weeks ago ESRD on peritoneal dialysis [facility])   [place] Wallace, [place], DO   3 weeks ago [place] Wallace, [place], DO   1 month ago Diabetes mellitus, type II, insulin dependent ([place]), on CGM, with end organ damage: renal, neuropathy, gastroparesis, retinopathy   [place], DO   3 months ago Swelling of left parotid gland   [place], DO   3 months ago Chronic right-sided thoracic back  pain   [place] Wallace, [place], [place]

## 2019-04-10 ENCOUNTER — Other Ambulatory Visit: Payer: Self-pay | Admitting: Family Medicine

## 2019-04-10 ENCOUNTER — Other Ambulatory Visit: Payer: Self-pay | Admitting: Physical Medicine and Rehabilitation

## 2019-04-10 MED ORDER — SIMVASTATIN 10 MG PO TABS: 5.0000 mg | ORAL_TABLET | Freq: Every day | ORAL | 1 refills | Status: DC

## 2019-04-10 NOTE — Telephone Encounter (Signed)
See request °

## 2019-04-10 NOTE — Telephone Encounter (Signed)
Copied from [facility] [id number]. Topic: Quick Communication - Rx Refill/Question >> Apr 10, 2019 12:25 PM Nils Flack wrote: Medication: 14 day meter sensors, gabapentin, midodrine  Has the patient contacted their pharmacy?yes (Agent: If no, request that the patient contact the pharmacy for the refill.) (Agent: If yes, when and what did the pharmacy advise?)  Preferred Pharmacy (with phone number or street name): [facility]  Pt says midodrine was originally prescribed by dr at [facility] sensors were from when another dr handled pt's diabetes   Agent: Please be advised that RX refills may take up to 3 business days. We ask that you follow-up with your pharmacy.

## 2019-04-11 MED ORDER — GABAPENTIN 100 MG PO CAPS: 100.0000 mg | ORAL_CAPSULE | Freq: Every day | ORAL | 1 refills | Status: DC

## 2019-04-11 MED ORDER — MIDODRINE HCL 5 MG PO TABS: 5.0000 mg | ORAL_TABLET | Freq: Two times a day (BID) | ORAL | 0 refills | Status: DC

## 2019-04-11 MED ORDER — FREESTYLE LIBRE 14 DAY SENSOR MISC: 1.0000 | 1 refills | Status: DC

## 2019-04-11 NOTE — Telephone Encounter (Signed)
Last OV 03/23/2019 Last refill 14 day meter sensor - not on med list                 Gabapentin 100 mg 02/24/2019 #30/0                  Midodrine - not on med list, Per ED note 03/16/19 - start 5 mg BID Next OV not scheduled  Forwarding to Dr. Juleen China

## 2019-04-17 ENCOUNTER — Other Ambulatory Visit: Payer: Self-pay | Admitting: Family Medicine

## 2019-04-17 DIAGNOSIS — E876 Hypokalemia: Secondary | ICD-10-CM

## 2019-04-24 ENCOUNTER — Telehealth: Payer: Self-pay | Admitting: Family Medicine

## 2019-04-24 NOTE — Telephone Encounter (Signed)
Okay to change? 

## 2019-04-24 NOTE — Telephone Encounter (Signed)
PA for [employer] initiated.

## 2019-04-24 NOTE — Telephone Encounter (Signed)
Copied from [employer] [phone]. Topic: Quick Communication - See Telephone Encounter >> Apr 24, 2019  9:51 AM Rutherford Nail, NT wrote: CRM for notification. See Telephone encounter for: 04/24/19. Industry Transplant Case Manager calling and states that she was speaking with the patient and the patient told her that she is unable to get the Free style [place] to monitor her blood glucose. Would like to know if someone could reach out to her insurance ([facility]) and see if there is anything that can be done? Also, would like to know if the most recent OV notes could be faxed over to her? Please advise.    Fax#: [phone] CB#: [phone]

## 2019-04-24 NOTE — Telephone Encounter (Signed)
Available Formulary Alternatives: DEXCOM CONTINUOUS GLUCOSE MONITORING SYSTEM  Dr. Juleen China, [place] to change BG meter to preferred product?

## 2019-04-24 NOTE — Telephone Encounter (Signed)
See note

## 2019-04-25 ENCOUNTER — Ambulatory Visit (INDEPENDENT_AMBULATORY_CARE_PROVIDER_SITE_OTHER): Payer: Managed Care, Other (non HMO) | Admitting: Family Medicine

## 2019-04-25 ENCOUNTER — Encounter: Payer: Self-pay | Admitting: Family Medicine

## 2019-04-25 ENCOUNTER — Other Ambulatory Visit: Payer: Self-pay

## 2019-04-25 VITALS — BP 164/98 | HR 79 | Temp 98.6°F | Ht 69.0 in | Wt 230.0 lb

## 2019-04-25 DIAGNOSIS — N186 End stage renal disease: Secondary | ICD-10-CM | POA: Diagnosis not present

## 2019-04-25 DIAGNOSIS — Z992 Dependence on renal dialysis: Secondary | ICD-10-CM | POA: Diagnosis not present

## 2019-04-25 DIAGNOSIS — E119 Type 2 diabetes mellitus without complications: Secondary | ICD-10-CM

## 2019-04-25 DIAGNOSIS — Z794 Long term (current) use of insulin: Secondary | ICD-10-CM

## 2019-04-25 DIAGNOSIS — S81801A Unspecified open wound, right lower leg, initial encounter: Secondary | ICD-10-CM

## 2019-04-25 MED ORDER — DOXYCYCLINE HYCLATE 100 MG PO TABS: 100.0000 mg | ORAL_TABLET | Freq: Two times a day (BID) | ORAL | 0 refills | Status: DC

## 2019-04-25 MED ORDER — BLOOD GLUCOSE MONITOR KIT: PACK | 0 refills | Status: DC

## 2019-04-25 NOTE — Progress Notes (Signed)
Heather [name] is a 48 y.o. female here for an acute visit.  History of Present Illness:   [name], CMA acting as scribe for Dr. Briscoe Deutscher.   HPI: Patient has sore on back of right foot. Not sure how long has been there but thinks that it started after her last hospitalization. Patient is having some pain and discomfort around that area. Lack of sensation usually due to peripheral neuropathy. Has noticed some edema to the lower leg.   PMHx, SurgHx, SocialHx, Medications, and Allergies were reviewed in the Visit Navigator and updated as appropriate.  Current Medications   Current Outpatient Medications:  .  acetaminophen (TYLENOL) 325 MG tablet, Take 1-2 tablets (325-650 mg total) by mouth every 4 (four) hours as needed for mild pain., Disp: , Rfl:  .  blood glucose meter kit and supplies KIT, Dispense based on patient and insurance preference. Use up to four times daily as directed. (FOR ICD-9 250.00, 250.01)., Disp: 1 each, Rfl: 0 .  Continuous Blood Gluc Sensor (FREESTYLE LIBRE 14 DAY SENSOR) MISC, 1 patch by Does not apply route every 14 (fourteen) days., Disp: 6 each, Rfl: 1 .  gabapentin (NEURONTIN) 100 MG capsule, Take 1 capsule (100 mg total) by mouth daily., Disp: 90 capsule, Rfl: 1 .  gentamicin cream (GARAMYCIN) 0.1 %, Apply 1 application topically daily., Disp: 30 g, Rfl: 0 .  insulin aspart (NOVOLOG FLEXPEN) 100 UNIT/ML FlexPen, Inject into the skin., Disp: , Rfl:  .  insulin degludec (TRESIBA FLEXTOUCH) 100 UNIT/ML SOPN FlexTouch Pen, Inject into the skin., Disp: , Rfl:  .  lanthanum (FOSRENOL) 1000 MG chewable tablet, Chew 1 tablet (1,000 mg total) by mouth 3 (three) times daily with meals., Disp: , Rfl:  .  lubiprostone (AMITIZA) 24 MCG capsule, Take 1 capsule (24 mcg total) by mouth 2 (two) times daily with a meal., Disp: , Rfl:  .  midodrine (PROAMATINE) 5 MG tablet, Take 1 tablet (5 mg total) by mouth 2 (two) times daily with a meal., Disp: 180 tablet,  Rfl: 0 .  multivitamin (RENA-VIT) TABS tablet, Take 1 tablet by mouth at bedtime., Disp: 30 tablet, Rfl: 0 .  potassium chloride (K-DUR) 10 MEQ tablet, 4 TAB DAILY, Disp: 120 tablet, Rfl: 0 .  simvastatin (ZOCOR) 10 MG tablet, Take 0.5 tablets (5 mg total) by mouth at bedtime. 1/2 po q hs - GFR 14, Disp: 30 tablet, Rfl: 1 .  doxycycline (VIBRA-TABS) 100 MG tablet, Take 1 tablet (100 mg total) by mouth 2 (two) times daily., Disp: 20 tablet, Rfl: 0   Allergies  Allergen Reactions  . Ibuprofen Other (See Comments)    CKD stage 3. Should avoid.  . Dilaudid [Hydromorphone Hcl] Itching and Other (See Comments)    Can take with Benadryl.  . Tramadol Itching   Review of Systems   Pertinent items are noted in the HPI. Otherwise, ROS is negative.  Vitals   Vitals:   04/25/19 1126  BP: (!) 164/98  Pulse: 79  Temp: 98.6 F (37 C)  TempSrc: Oral  SpO2: 100%  Weight: 230 lb (104.3 kg)  Height: _0  (1.753 m)     Body mass index is 33.97 kg/m.  Physical Exam   Physical Exam Vitals signs and nursing note reviewed.  Constitutional:      General: [name] not in acute distress. HENT:     Head: Normocephalic and atraumatic.  Eyes:     Pupils: Pupils are equal, round, and reactive to  light.  Neck:     Musculoskeletal: Normal range of motion and neck supple.  Cardiovascular:     Rate and Rhythm: Normal rate and regular rhythm.  Pulmonary:     Effort: Pulmonary effort is normal.  Abdominal:     Palpations: Abdomen is soft.  Skin:    General: Skin is warm and dry.     Findings: Wound present.          Comments: Chronic wound right posterior ankle, crusted with open wound in center, without drainage or bleeding. Nodular wound superiorly.   Neurological:     General: No focal deficit present.     Mental Status: [name] alert.  Psychiatric:        Behavior: Behavior normal.     Assessment and Plan   Heather [name] was seen today for foot injury.  Diagnoses and all orders for this  visit:  Wound of right leg, initial encounter Comments: High risk patient with DM, ESRD, PN, PAD. Labs with Renal stable per patient. Wound care instructions today. Will get to [facility] ASAP. Will ask Renal to add uric acid to next blood draw.  Orders: -     doxycycline (VIBRA-TABS) 100 MG tablet; Take 1 tablet (100 mg total) by mouth 2 (two) times daily. -     AMB referral to wound care center  ESRD on peritoneal dialysis ([place])  Diabetes mellitus, type II, insulin dependent ([place]), on CGM, with end organ damage: renal, neuropathy, gastroparesis, retinopathy  . Reviewed expectations re: course of current medical issues. . Discussed self-management of symptoms. . Outlined signs and symptoms indicating need for more acute intervention. . Patient verbalized understanding and all questions were answered. [name] Health Maintenance issues including appropriate healthy diet, exercise, and smoking avoidance were discussed with patient. . See orders for this visit as documented in the electronic medical record. . Patient received an After Visit Summary.  CMA served as Education administrator during this visit. History, Physical, and Plan performed by medical provider. The above documentation has been reviewed and is accurate and complete. Briscoe Deutscher, D.O.  Briscoe Deutscher, DO [place], [facility] 04/26/2019

## 2019-05-02 ENCOUNTER — Encounter: Payer: Self-pay | Admitting: Family Medicine

## 2019-05-16 ENCOUNTER — Encounter (HOSPITAL_BASED_OUTPATIENT_CLINIC_OR_DEPARTMENT_OTHER): Payer: Managed Care, Other (non HMO) | Attending: Internal Medicine

## 2019-05-19 ENCOUNTER — Other Ambulatory Visit: Payer: Self-pay | Admitting: Family Medicine

## 2019-05-19 DIAGNOSIS — E876 Hypokalemia: Secondary | ICD-10-CM

## 2019-05-22 ENCOUNTER — Ambulatory Visit: Payer: Self-pay | Admitting: Family Medicine

## 2019-05-22 NOTE — Telephone Encounter (Signed)
Please call patient to schedule.

## 2019-05-22 NOTE — Telephone Encounter (Signed)
Pt. Reports she fell over a week ago on her left side. Is having pain to her left ribs that radiates to her back. Hurts with movement and deep breathing. Warm transfer to [facility] in the practice for a virtual visit.  Answer Assessment - Initial Assessment Questions 1. MECHANISM: "How did the injury happen?"     Fell over a week ago 2. ONSET: "When did the injury happen?" (Minutes or hours ago)     Over 1 week ago 3. LOCATION: "Where on the chest is the injury located?"     Left side of ribs and back 4. APPEARANCE: "What does the injury look like?"     No bruising 5. BLEEDING: "Is there any bleeding now? If so, ask: How long has it been bleeding?"     No 6. SEVERITY: "Any difficulty with breathing?"     Hurts with a deep breath and movement 7. SIZE: For cuts, bruises, or swelling, ask: "How large is it?" (e.g., inches or centimeters)     n/a 8. PAIN: "Is there pain?" If so, ask: "How bad is the pain?"   (e.g., Scale 1-10; or mild, moderate, severe)     10 9. TETANUS: For any breaks in the skin, ask: "When was the last tetanus booster?"     Unsure 10. PREGNANCY: "Is there any chance you are pregnant?" "When was your last menstrual period?"       No  Protocols used: CHEST INJURY-A-AH

## 2019-05-23 ENCOUNTER — Other Ambulatory Visit: Payer: Self-pay

## 2019-05-23 ENCOUNTER — Ambulatory Visit (HOSPITAL_BASED_OUTPATIENT_CLINIC_OR_DEPARTMENT_OTHER)
Admission: RE | Admit: 2019-05-23 | Discharge: 2019-05-23 | Disposition: A | Discharge status: Home / Self Care | Payer: Managed Care, Other (non HMO) | Source: Ambulatory Visit | Attending: Family Medicine | Admitting: Family Medicine

## 2019-05-23 ENCOUNTER — Encounter: Payer: Self-pay | Admitting: Family Medicine

## 2019-05-23 ENCOUNTER — Ambulatory Visit (INDEPENDENT_AMBULATORY_CARE_PROVIDER_SITE_OTHER): Payer: Managed Care, Other (non HMO) | Admitting: Family Medicine

## 2019-05-23 VITALS — Ht 69.0 in | Wt 230.0 lb

## 2019-05-23 DIAGNOSIS — S2232XA Fracture of one rib, left side, initial encounter for closed fracture: Secondary | ICD-10-CM

## 2019-05-23 DIAGNOSIS — R0781 Pleurodynia: Secondary | ICD-10-CM | POA: Diagnosis present

## 2019-05-23 NOTE — Progress Notes (Signed)
Virtual Visit via Video   Due to the COVID-19 pandemic, this visit was completed with telemedicine (audio/video) technology to reduce patient and provider exposure as well as to preserve personal protective equipment.   I connected with Heather [name] by a video enabled telemedicine application and verified that I am speaking with the correct person using two identifiers. Location patient: Home Location provider: [facility], Office Persons participating in the virtual visit: Heather [name], Heather Deutscher, Heather [name]   I discussed the limitations of evaluation and management by telemedicine and the availability of in person appointments. The patient expressed understanding and agreed to proceed.  Care Team   Patient Care Team: Heather Deutscher, Heather [name] as PCP - General (Family Medicine) Gerarda Fraction, MD as Referring Physician (Ophthalmology) [facility]  Subjective:   HPI: She has some pain on left arm left side and left leg. She had fall. Not due to any medications she just tripped on feet. She does have some pain with movement of arm. She did not loose consciousness she is not sure if she hit right side of head or not. No changes in vision or head ache.   Pt reports she fell over a week ago on her left side. Is having pain to her left ribs that radiates to her back. Hurts with movement and deep breathing. Warm transfer to [facility] in the practice for a virtual visit.  Answer Assessment - Initial Assessment Questions 1. MECHANISM: "How did the injury happen?"     Fell over a week ago 2. ONSET: "When did the injury happen?" (Minutes or hours ago)     Over 1 week ago 3. LOCATION: "Where on the chest is the injury located?"     Left side of ribs and back 4. APPEARANCE: "What does the injury look like?"     No bruising 5. BLEEDING: "Is there any bleeding now? If so, ask: How long has it been bleeding?"     No 6. SEVERITY: "Any difficulty with breathing?"  Hurts with a deep breath and movement 7. SIZE: For cuts, bruises, or swelling, ask: "How large is it?" (e.g., inches or centimeters)     n/a 8. PAIN: "Is there pain?" If so, ask: "How bad is the pain?"   (e.g., Scale 1-10; or mild, moderate, severe)     10 9. TETANUS: For any breaks in the skin, ask: "When was the last tetanus booster?"     Unsure 10. PREGNANCY: "Is there any chance you are pregnant?" "When was your last menstrual period?"       No  Review of Systems  Constitutional: Negative for chills and fever.  HENT: Negative for hearing loss and tinnitus.   Eyes: Negative for blurred vision and double vision.  Respiratory: Negative for cough.   Cardiovascular: Negative for chest pain and palpitations.  Gastrointestinal: Negative for heartburn and nausea.  Genitourinary: Negative for dysuria and urgency.  Musculoskeletal: Negative for myalgias.  Skin: Negative for rash.  Neurological: Negative for dizziness and headaches.  Endo/Heme/Allergies: Does not bruise/bleed easily.  Psychiatric/Behavioral: Negative for depression and suicidal ideas.     Patient Active Problem List   Diagnosis Date Noted  . ESRD on peritoneal dialysis ([place]) 04/25/2019  . History of peritonitis 03/04/2019  . Leucocytosis   . Neuropathic pain   . Renovascular hypertension   . Anemia of chronic disease   . Diabetes mellitus type 2 in obese ([place])   . ESRD on dialysis ([place])   . Bacterial  peritonitis ([place])   . Debility 02/13/2019  . Peritonitis, dialysis-associated, initial encounter ([place]) 02/05/2019  . Peritonitis, dialysis-associated ([place]) 02/05/2019  . Left ovarian cyst 02/05/2019  . Hemiplegia and hemiparesis following cerebral infarction affecting left non-dominant side ([place]) 12/29/2018  . Peritoneal dialysis catheter in place [facility]) 11/14/2018  . Pre-transplant evaluation for kidney transplant 11/07/2018  . ESRD with anemia ([place])   . ESRD (end stage renal disease) on dialysis ([place])   . Grade I  diastolic dysfunction 37/09/6268  . Arteriovenous fistula ([place]) 07/21/2018  . Type 2 diabetes mellitus with end-stage renal disease ([place]) 07/21/2018  . History of cerebrovascular accident (CVA) involving cerebellum 07/21/2018  . Cyst of left ovary 01/12/2018  . Dysmenorrhea 01/12/2018  . Insomnia 01/12/2018  . Menorrhagia 01/12/2018  . Vitamin D deficiency 01/02/2018  . Bilateral lower extremity edema 01/02/2018  . Reactive depression 12/10/2017  . Arthralgia of right temporomandibular joint 10/24/2017  . Antiplatelet or antithrombotic long-term use: Plavix 06/30/2017  . Morbid obesity due to excess calories ([place]) 11/16/2016  . Upper airway cough syndrome 11/15/2016  . DM (diabetes mellitus), type 2, uncontrolled, with renal complications ([place]) 48/54/6270  . Dyslipidemia associated with type 2 diabetes mellitus ([place]) 06/25/2016  . Uncontrolled type 2 diabetes mellitus with both eyes affected by proliferative retinopathy and macular edema, with long-term current use of insulin ([place]) 06/22/2016  . Uncontrolled type 2 diabetes mellitus with diabetic polyneuropathy, with long-term current use of insulin ([place]) 03/08/2016  . Proliferative diabetic retinopathy ([place]) 09/29/2015  . Constipation 09/03/2015  . Pseudophakia of both eyes 05/30/2015  . 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 B12 deficiency 05/10/2014  . Anemia associated with chronic renal failure, [place] 04/26/2014  . CKD (chronic kidney disease) stage 5, GFR less than 15 ml/min (HCC) 04/26/2014  . Secondary hyperparathyroidism ([place]) 11/27/2013  . Posterior subcapsular cataract, bilateral 10/18/2013  . Diabetic neuropathy, painful ([place]), on low dose Gabapentin 07/13/2013  . Diabetes mellitus, type II, insulin dependent ([place]), on CGM, with end organ damage: renal, neuropathy, gastroparesis, retinopathy 02/19/2011  . Hypertension associated with diabetes ([place]) 02/19/2011  . Gastroparesis due to DM ([place]) 02/19/2011    Social  History   Tobacco Use  . Smoking status: Never Smoker  . Smokeless tobacco: Never Used  Substance Use Topics  . Alcohol use: No    Alcohol/week: 0.0 standard drinks    Current Outpatient Medications:  .  acetaminophen (TYLENOL) 325 MG tablet, Take 1-2 tablets (325-650 mg total) by mouth every 4 (four) hours as needed for mild pain., Disp: , Rfl:  .  blood glucose meter kit and supplies KIT, Dispense based on patient and insurance preference. Use up to four times daily as directed. (FOR ICD-9 250.00, 250.01)., Disp: 1 each, Rfl: 0 .  Continuous Blood Gluc Sensor (FREESTYLE LIBRE 14 DAY SENSOR) MISC, 1 patch by Does not apply route every 14 (fourteen) days., Disp: 6 each, Rfl: 1 .  gabapentin (NEURONTIN) 100 MG capsule, Take 1 capsule (100 mg total) by mouth daily., Disp: 90 capsule, Rfl: 1 .  gentamicin cream (GARAMYCIN) 0.1 %, Apply 1 application topically daily., Disp: 30 g, Rfl: 0 .  insulin aspart (NOVOLOG FLEXPEN) 100 UNIT/ML FlexPen, Inject into the skin., Disp: , Rfl:  .  insulin degludec (TRESIBA FLEXTOUCH) 100 UNIT/ML SOPN FlexTouch Pen, Inject into the skin., Disp: , Rfl:  .  lanthanum (FOSRENOL) 1000 MG chewable tablet, Chew 1 tablet (1,000 mg total) by mouth 3 (three) times daily with meals., Disp: , Rfl:  .  lubiprostone (AMITIZA) 24 MCG capsule, Take 1 capsule (24 mcg total) by mouth 2 (two) times daily with a meal., Disp: , Rfl:  .  midodrine (PROAMATINE) 5 MG tablet, Take 1 tablet (5 mg total) by mouth 2 (two) times daily with a meal., Disp: 180 tablet, Rfl: 0 .  multivitamin (RENA-VIT) TABS tablet, Take 1 tablet by mouth at bedtime., Disp: 30 tablet, Rfl: 0 .  potassium chloride (K-DUR) 10 MEQ tablet, TAKE 4 TABLETS BY MOUTH EVERY DAY, Disp: 120 tablet, Rfl: 0 .  simvastatin (ZOCOR) 10 MG tablet, Take 0.5 tablets (5 mg total) by mouth at bedtime. 1/2 po q hs - GFR 14, Disp: 30 tablet, Rfl: 1  Allergies  Allergen Reactions  . Ibuprofen Other (See Comments)    CKD stage 3.  Should avoid.  . Dilaudid [Hydromorphone Hcl] Itching and Other (See Comments)    Can take with Benadryl.  . Tramadol Itching    Objective:   VITALS: Per patient if applicable, see vitals. GENERAL: Alert, appears well and in no acute distress. HEENT: Atraumatic, conjunctiva clear, no obvious abnormalities on inspection of external nose and ears. NECK: Normal movements of the head and neck. CARDIOPULMONARY: No increased WOB. Speaking in clear sentences. I:E ratio WNL.  MS: Points to left posterolateral ribs just below breast as ttp.  PSYCH: Pleasant and cooperative, well-groomed. Speech normal rate and rhythm. Affect is appropriate. Insight and judgement are appropriate. Attention is focused, linear, and appropriate.  NEURO: CN grossly intact. Oriented as arrived to appointment on time with no prompting. Moves both UE equally.  SKIN: No obvious lesions, wounds, erythema, or cyanosis noted on face or hands.  Depression screen [facility] 2/9 01/02/2018 12/08/2017 06/30/2017  Decreased Interest 1 1 0  Down, Depressed, Hopeless 0 3 0  PHQ - 2 Score 1 4 0  Altered sleeping 3 3 -  Tired, decreased energy 3 2 -  Change in appetite 1 3 -  Feeling bad or failure about yourself  0 2 -  Trouble concentrating 0 1 -  Moving slowly or fidgety/restless 1 1 -  Suicidal thoughts 0 1 -  PHQ-9 Score 9 17 -  Some recent data might be hidden    Assessment and Plan:   Psalm was seen today for fall.  Diagnoses and all orders for this visit:  Rib pain on left side -     DG Ribs Unilateral W/Chest Left  Closed fracture of one rib of left side, initial encounter Comments: Revealed on xray. Discussed symptomative care, incentive spirometry, red flags.     [name] COVID-19 Education: The signs and symptoms of COVID-19 were discussed with the patient and how to seek care for testing if needed. The importance of social distancing was discussed today. . Reviewed expectations re: course of current medical  issues. . Discussed self-management of symptoms. . Outlined signs and symptoms indicating need for more acute intervention. . Patient verbalized understanding and all questions were answered. [name] Health Maintenance issues including appropriate healthy diet, exercise, and smoking avoidance were discussed with patient. . See orders for this visit as documented in the electronic medical record.  Heather Deutscher, Heather [name]  Records requested if needed. Time spent: 25 minutes, of which >50% was spent in obtaining information about her symptoms, reviewing her previous labs, evaluations, and treatments, counseling her about her condition (please see the discussed topics above), and developing a plan to further investigate it; she had a number of questions which I addressed.

## 2019-05-25 ENCOUNTER — Encounter: Payer: Self-pay | Admitting: Family Medicine

## 2019-05-28 ENCOUNTER — Other Ambulatory Visit: Payer: Self-pay

## 2019-05-28 MED ORDER — CYCLOBENZAPRINE HCL 5 MG PO TABS: ORAL_TABLET | ORAL | 0 refills | Status: DC

## 2019-05-30 ENCOUNTER — Other Ambulatory Visit: Payer: Self-pay

## 2019-05-30 ENCOUNTER — Other Ambulatory Visit: Payer: Self-pay | Admitting: Nephrology

## 2019-05-30 ENCOUNTER — Inpatient Hospital Stay (HOSPITAL_COMMUNITY)
Admission: EM | Admit: 2019-05-30 | Discharge: 2019-06-02 | DRG: 919 | Disposition: A | Discharge status: Home / Self Care | Payer: Managed Care, Other (non HMO) | Attending: Internal Medicine | Admitting: Internal Medicine

## 2019-05-30 ENCOUNTER — Ambulatory Visit
Admission: RE | Admit: 2019-05-30 | Discharge: 2019-05-30 | Disposition: A | Discharge status: Home / Self Care | Payer: Managed Care, Other (non HMO) | Source: Ambulatory Visit | Attending: Nephrology | Admitting: Nephrology

## 2019-05-30 ENCOUNTER — Encounter (HOSPITAL_COMMUNITY): Payer: Self-pay | Admitting: Emergency Medicine

## 2019-05-30 DIAGNOSIS — E785 Hyperlipidemia, unspecified: Secondary | ICD-10-CM | POA: Diagnosis present

## 2019-05-30 DIAGNOSIS — N2581 Secondary hyperparathyroidism of renal origin: Secondary | ICD-10-CM | POA: Diagnosis present

## 2019-05-30 DIAGNOSIS — T85611A Breakdown (mechanical) of intraperitoneal dialysis catheter, initial encounter: Secondary | ICD-10-CM | POA: Diagnosis not present

## 2019-05-30 DIAGNOSIS — Z8371 Family history of colonic polyps: Secondary | ICD-10-CM

## 2019-05-30 DIAGNOSIS — E1122 Type 2 diabetes mellitus with diabetic chronic kidney disease: Secondary | ICD-10-CM | POA: Diagnosis present

## 2019-05-30 DIAGNOSIS — D631 Anemia in chronic kidney disease: Secondary | ICD-10-CM | POA: Diagnosis present

## 2019-05-30 DIAGNOSIS — I69349 Monoplegia of lower limb following cerebral infarction affecting unspecified side: Secondary | ICD-10-CM

## 2019-05-30 DIAGNOSIS — Z79899 Other long term (current) drug therapy: Secondary | ICD-10-CM

## 2019-05-30 DIAGNOSIS — Z885 Allergy status to narcotic agent status: Secondary | ICD-10-CM

## 2019-05-30 DIAGNOSIS — I519 Heart disease, unspecified: Secondary | ICD-10-CM | POA: Diagnosis present

## 2019-05-30 DIAGNOSIS — Z841 Family history of disorders of kidney and ureter: Secondary | ICD-10-CM

## 2019-05-30 DIAGNOSIS — T85611S Breakdown (mechanical) of intraperitoneal dialysis catheter, sequela: Secondary | ICD-10-CM

## 2019-05-30 DIAGNOSIS — I120 Hypertensive chronic kidney disease with stage 5 chronic kidney disease or end stage renal disease: Secondary | ICD-10-CM | POA: Diagnosis present

## 2019-05-30 DIAGNOSIS — E1165 Type 2 diabetes mellitus with hyperglycemia: Secondary | ICD-10-CM | POA: Diagnosis present

## 2019-05-30 DIAGNOSIS — Z888 Allergy status to other drugs, medicaments and biological substances status: Secondary | ICD-10-CM

## 2019-05-30 DIAGNOSIS — Z823 Family history of stroke: Secondary | ICD-10-CM

## 2019-05-30 DIAGNOSIS — Z992 Dependence on renal dialysis: Secondary | ICD-10-CM

## 2019-05-30 DIAGNOSIS — N186 End stage renal disease: Secondary | ICD-10-CM

## 2019-05-30 DIAGNOSIS — K5909 Other constipation: Secondary | ICD-10-CM | POA: Diagnosis present

## 2019-05-30 DIAGNOSIS — K659 Peritonitis, unspecified: Secondary | ICD-10-CM | POA: Diagnosis present

## 2019-05-30 DIAGNOSIS — D638 Anemia in other chronic diseases classified elsewhere: Secondary | ICD-10-CM | POA: Diagnosis present

## 2019-05-30 DIAGNOSIS — Z833 Family history of diabetes mellitus: Secondary | ICD-10-CM

## 2019-05-30 DIAGNOSIS — M797 Fibromyalgia: Secondary | ICD-10-CM | POA: Diagnosis present

## 2019-05-30 DIAGNOSIS — E113599 Type 2 diabetes mellitus with proliferative diabetic retinopathy without macular edema, unspecified eye: Secondary | ICD-10-CM | POA: Diagnosis present

## 2019-05-30 DIAGNOSIS — T859XXA Unspecified complication of internal prosthetic device, implant and graft, initial encounter: Secondary | ICD-10-CM

## 2019-05-30 DIAGNOSIS — E1142 Type 2 diabetes mellitus with diabetic polyneuropathy: Secondary | ICD-10-CM | POA: Diagnosis present

## 2019-05-30 DIAGNOSIS — T8571XA Infection and inflammatory reaction due to peritoneal dialysis catheter, initial encounter: Secondary | ICD-10-CM | POA: Diagnosis present

## 2019-05-30 DIAGNOSIS — Z8249 Family history of ischemic heart disease and other diseases of the circulatory system: Secondary | ICD-10-CM

## 2019-05-30 DIAGNOSIS — Z794 Long term (current) use of insulin: Secondary | ICD-10-CM

## 2019-05-30 DIAGNOSIS — N250 Renal osteodystrophy: Secondary | ICD-10-CM | POA: Diagnosis present

## 2019-05-30 DIAGNOSIS — IMO0002 Reserved for concepts with insufficient information to code with codable children: Secondary | ICD-10-CM

## 2019-05-30 DIAGNOSIS — Z6835 Body mass index (BMI) 35.0-35.9, adult: Secondary | ICD-10-CM

## 2019-05-30 DIAGNOSIS — I150 Renovascular hypertension: Secondary | ICD-10-CM | POA: Diagnosis present

## 2019-05-30 DIAGNOSIS — E669 Obesity, unspecified: Secondary | ICD-10-CM | POA: Diagnosis present

## 2019-05-30 DIAGNOSIS — Y812 Prosthetic and other implants, materials and accessory general- and plastic-surgery devices associated with adverse incidents: Secondary | ICD-10-CM | POA: Diagnosis present

## 2019-05-30 DIAGNOSIS — Z1159 Encounter for screening for other viral diseases: Secondary | ICD-10-CM

## 2019-05-30 DIAGNOSIS — I5189 Other ill-defined heart diseases: Secondary | ICD-10-CM | POA: Diagnosis present

## 2019-05-30 LAB — I-STAT BETA HCG BLOOD, ED (MC, WL, AP ONLY): I-stat hCG, quantitative: <5 m[IU]/mL (ref <5)

## 2019-05-30 LAB — COMPREHENSIVE METABOLIC PANEL
ALT: 14 U/L (ref 0–44)
AST: 14 U/L — ABNORMAL LOW (ref 15–41)
Albumin: 2.6 g/dL — ABNORMAL LOW (ref 3.5–5.0)
Alkaline Phosphatase: 140 U/L — ABNORMAL HIGH (ref 38–126)
Anion gap: 16 — ABNORMAL HIGH (ref 5–15)
BUN: 37 mg/dL — ABNORMAL HIGH (ref 6–20)
CO2: 21 mmol/L — ABNORMAL LOW (ref 22–32)
Calcium: 7.6 mg/dL — ABNORMAL LOW (ref 8.9–10.3)
Chloride: 96 mmol/L — ABNORMAL LOW (ref 98–111)
Creatinine, Ser: 8.13 mg/dL — ABNORMAL HIGH (ref 0.44–1.00)
GFR calc Af Amer: 6 mL/min — ABNORMAL LOW (ref >60)
GFR calc non Af Amer: 5 mL/min — ABNORMAL LOW (ref >60)
Glucose, Bld: 393 mg/dL — ABNORMAL HIGH (ref 70–99)
Potassium: 3.7 mmol/L (ref 3.5–5.1)
Sodium: 133 mmol/L — ABNORMAL LOW (ref 135–145)
Total Bilirubin: 0.8 mg/dL (ref 0.3–1.2)
Total Protein: 7.7 g/dL (ref 6.5–8.1)

## 2019-05-30 LAB — LIPASE, BLOOD: Lipase: 65 U/L — ABNORMAL HIGH (ref 11–51)

## 2019-05-30 LAB — CBC
HCT: 34.8 % — ABNORMAL LOW (ref 36.0–46.0)
Hemoglobin: 10.2 g/dL — ABNORMAL LOW (ref 12.0–15.0)
MCH: 29.3 pg (ref 26.0–34.0)
MCHC: 29.3 g/dL — ABNORMAL LOW (ref 30.0–36.0)
MCV: 100 fL (ref 80.0–100.0)
Platelets: 322 10*3/uL (ref 150–400)
RBC: 3.48 MIL/uL — ABNORMAL LOW (ref 3.87–5.11)
RDW: 12.6 % (ref 11.5–15.5)
WBC: 9.1 10*3/uL (ref 4.0–10.5)
nRBC: 0 % (ref 0.0–0.2)

## 2019-05-30 MED ORDER — SODIUM CHLORIDE 0.9% FLUSH: 3.0000 mL | Freq: Once | INTRAVENOUS | Status: DC

## 2019-05-30 NOTE — ED Notes (Signed)
Husband would like to be updated. Phone number is [phone].

## 2019-05-30 NOTE — ED Triage Notes (Signed)
Pt sent by doctor for peritonitis and blocked peritoneal dialysis catheter. Last dialysis x 2 days ago. Denies shortness of breath at this time.

## 2019-05-31 ENCOUNTER — Other Ambulatory Visit: Payer: Self-pay

## 2019-05-31 ENCOUNTER — Encounter (HOSPITAL_COMMUNITY): Payer: Self-pay | Admitting: Family Medicine

## 2019-05-31 ENCOUNTER — Inpatient Hospital Stay (HOSPITAL_COMMUNITY): Payer: Managed Care, Other (non HMO)

## 2019-05-31 DIAGNOSIS — I69349 Monoplegia of lower limb following cerebral infarction affecting unspecified side: Secondary | ICD-10-CM | POA: Diagnosis not present

## 2019-05-31 DIAGNOSIS — Z79899 Other long term (current) drug therapy: Secondary | ICD-10-CM | POA: Diagnosis not present

## 2019-05-31 DIAGNOSIS — Z992 Dependence on renal dialysis: Secondary | ICD-10-CM

## 2019-05-31 DIAGNOSIS — E785 Hyperlipidemia, unspecified: Secondary | ICD-10-CM | POA: Diagnosis present

## 2019-05-31 DIAGNOSIS — E113599 Type 2 diabetes mellitus with proliferative diabetic retinopathy without macular edema, unspecified eye: Secondary | ICD-10-CM | POA: Diagnosis present

## 2019-05-31 DIAGNOSIS — T85611A Breakdown (mechanical) of intraperitoneal dialysis catheter, initial encounter: Principal | ICD-10-CM | POA: Diagnosis present

## 2019-05-31 DIAGNOSIS — N250 Renal osteodystrophy: Secondary | ICD-10-CM | POA: Diagnosis present

## 2019-05-31 DIAGNOSIS — N186 End stage renal disease: Secondary | ICD-10-CM | POA: Diagnosis present

## 2019-05-31 DIAGNOSIS — Z885 Allergy status to narcotic agent status: Secondary | ICD-10-CM | POA: Diagnosis not present

## 2019-05-31 DIAGNOSIS — Z888 Allergy status to other drugs, medicaments and biological substances status: Secondary | ICD-10-CM | POA: Diagnosis not present

## 2019-05-31 DIAGNOSIS — Z1159 Encounter for screening for other viral diseases: Secondary | ICD-10-CM | POA: Diagnosis not present

## 2019-05-31 DIAGNOSIS — I120 Hypertensive chronic kidney disease with stage 5 chronic kidney disease or end stage renal disease: Secondary | ICD-10-CM | POA: Diagnosis present

## 2019-05-31 DIAGNOSIS — T8571XA Infection and inflammatory reaction due to peritoneal dialysis catheter, initial encounter: Secondary | ICD-10-CM | POA: Diagnosis not present

## 2019-05-31 DIAGNOSIS — D631 Anemia in chronic kidney disease: Secondary | ICD-10-CM | POA: Diagnosis present

## 2019-05-31 DIAGNOSIS — N2581 Secondary hyperparathyroidism of renal origin: Secondary | ICD-10-CM | POA: Diagnosis present

## 2019-05-31 DIAGNOSIS — K659 Peritonitis, unspecified: Secondary | ICD-10-CM | POA: Diagnosis present

## 2019-05-31 DIAGNOSIS — E1122 Type 2 diabetes mellitus with diabetic chronic kidney disease: Secondary | ICD-10-CM | POA: Diagnosis present

## 2019-05-31 DIAGNOSIS — E1142 Type 2 diabetes mellitus with diabetic polyneuropathy: Secondary | ICD-10-CM

## 2019-05-31 DIAGNOSIS — D638 Anemia in other chronic diseases classified elsewhere: Secondary | ICD-10-CM | POA: Diagnosis not present

## 2019-05-31 DIAGNOSIS — Z823 Family history of stroke: Secondary | ICD-10-CM | POA: Diagnosis not present

## 2019-05-31 DIAGNOSIS — E669 Obesity, unspecified: Secondary | ICD-10-CM | POA: Diagnosis present

## 2019-05-31 DIAGNOSIS — Y812 Prosthetic and other implants, materials and accessory general- and plastic-surgery devices associated with adverse incidents: Secondary | ICD-10-CM | POA: Diagnosis present

## 2019-05-31 DIAGNOSIS — Z794 Long term (current) use of insulin: Secondary | ICD-10-CM

## 2019-05-31 DIAGNOSIS — M797 Fibromyalgia: Secondary | ICD-10-CM | POA: Diagnosis present

## 2019-05-31 DIAGNOSIS — Z8249 Family history of ischemic heart disease and other diseases of the circulatory system: Secondary | ICD-10-CM | POA: Diagnosis not present

## 2019-05-31 DIAGNOSIS — Z6835 Body mass index (BMI) 35.0-35.9, adult: Secondary | ICD-10-CM | POA: Diagnosis not present

## 2019-05-31 DIAGNOSIS — I150 Renovascular hypertension: Secondary | ICD-10-CM

## 2019-05-31 DIAGNOSIS — E1165 Type 2 diabetes mellitus with hyperglycemia: Secondary | ICD-10-CM

## 2019-05-31 LAB — URINALYSIS, ROUTINE W REFLEX MICROSCOPIC
Bilirubin Urine: NEGATIVE
Glucose, UA: >=500 mg/dL — AB
Ketones, ur: NEGATIVE mg/dL
Nitrite: NEGATIVE
Protein, ur: >=300 mg/dL — AB
Specific Gravity, Urine: 1.013 (ref 1.005–1.030)
pH: 5 (ref 5.0–8.0)

## 2019-05-31 LAB — BODY FLUID CELL COUNT WITH DIFFERENTIAL
Eos, Fluid: 0 %
Lymphs, Fluid: 1 %
Monocyte-Macrophage-Serous Fluid: 15 % — ABNORMAL LOW (ref 50–90)
Neutrophil Count, Fluid: 84 % — ABNORMAL HIGH (ref 0–25)
Total Nucleated Cell Count, Fluid: 10000 cu mm — ABNORMAL HIGH (ref 0–1000)

## 2019-05-31 LAB — CBC WITH DIFFERENTIAL/PLATELET
Abs Immature Granulocytes: 0.03 10*3/uL (ref 0.00–0.07)
Basophils Absolute: 0 10*3/uL (ref 0.0–0.1)
Basophils Relative: 0 %
Eosinophils Absolute: 0.2 10*3/uL (ref 0.0–0.5)
Eosinophils Relative: 2 %
HCT: 31.2 % — ABNORMAL LOW (ref 36.0–46.0)
Hemoglobin: 9.3 g/dL — ABNORMAL LOW (ref 12.0–15.0)
Immature Granulocytes: 0 %
Lymphocytes Relative: 18 %
Lymphs Abs: 1.6 10*3/uL (ref 0.7–4.0)
MCH: 29.4 pg (ref 26.0–34.0)
MCHC: 29.8 g/dL — ABNORMAL LOW (ref 30.0–36.0)
MCV: 98.7 fL (ref 80.0–100.0)
Monocytes Absolute: 0.7 10*3/uL (ref 0.1–1.0)
Monocytes Relative: 8 %
Neutro Abs: 6.4 10*3/uL (ref 1.7–7.7)
Neutrophils Relative %: 72 %
Platelets: 305 10*3/uL (ref 150–400)
RBC: 3.16 MIL/uL — ABNORMAL LOW (ref 3.87–5.11)
RDW: 12.2 % (ref 11.5–15.5)
WBC: 9 10*3/uL (ref 4.0–10.5)
nRBC: 0 % (ref 0.0–0.2)

## 2019-05-31 LAB — GLUCOSE, CAPILLARY
Glucose-Capillary: 200 mg/dL — ABNORMAL HIGH (ref 70–99)
Glucose-Capillary: 105 mg/dL — ABNORMAL HIGH (ref 70–99)
Glucose-Capillary: 112 mg/dL — ABNORMAL HIGH (ref 70–99)
Glucose-Capillary: 148 mg/dL — ABNORMAL HIGH (ref 70–99)

## 2019-05-31 LAB — BASIC METABOLIC PANEL
Anion gap: 15 (ref 5–15)
BUN: 37 mg/dL — ABNORMAL HIGH (ref 6–20)
CO2: 20 mmol/L — ABNORMAL LOW (ref 22–32)
Calcium: 7.3 mg/dL — ABNORMAL LOW (ref 8.9–10.3)
Chloride: 98 mmol/L (ref 98–111)
Creatinine, Ser: 8.08 mg/dL — ABNORMAL HIGH (ref 0.44–1.00)
GFR calc Af Amer: 6 mL/min — ABNORMAL LOW (ref >60)
GFR calc non Af Amer: 5 mL/min — ABNORMAL LOW (ref >60)
Glucose, Bld: 314 mg/dL — ABNORMAL HIGH (ref 70–99)
Potassium: 3.8 mmol/L (ref 3.5–5.1)
Sodium: 133 mmol/L — ABNORMAL LOW (ref 135–145)

## 2019-05-31 LAB — APTT: aPTT: 27 seconds (ref 24–36)

## 2019-05-31 LAB — PHOSPHORUS: Phosphorus: 4.9 mg/dL — ABNORMAL HIGH (ref 2.5–4.6)

## 2019-05-31 LAB — CBG MONITORING, ED: Glucose-Capillary: 310 mg/dL — ABNORMAL HIGH (ref 70–99)

## 2019-05-31 LAB — HEMOGLOBIN A1C
Hgb A1c MFr Bld: 9.1 % — ABNORMAL HIGH (ref 4.8–5.6)
Mean Plasma Glucose: 214.47 mg/dL

## 2019-05-31 LAB — PROTIME-INR
INR: 1.1 (ref 0.8–1.2)
Prothrombin Time: 13.7 seconds (ref 11.4–15.2)

## 2019-05-31 LAB — SARS CORONAVIRUS 2 BY RT PCR (HOSPITAL ORDER, PERFORMED IN ~~LOC~~ HOSPITAL LAB): SARS Coronavirus 2: NEGATIVE

## 2019-05-31 MED ORDER — SODIUM CHLORIDE 0.9 % IV SOLN
1.0000 g | Freq: Every day | INTRAVENOUS | Status: DC
  Filled 2019-05-31: qty 1

## 2019-05-31 MED ORDER — SODIUM CHLORIDE 0.9 % IV SOLN
2.0000 g | Freq: Once | INTRAVENOUS | Status: AC
  Administered 2019-05-31: 2 g via INTRAVENOUS
  Filled 2019-05-31: qty 2

## 2019-05-31 MED ORDER — VANCOMYCIN HCL IN DEXTROSE 1-5 GM/200ML-% IV SOLN: 1000.0000 mg | Freq: Once | INTRAVENOUS | Status: DC

## 2019-05-31 MED ORDER — SODIUM CHLORIDE 0.9 % IV SOLN: 250.0000 mL | INTRAVENOUS | Status: DC | PRN

## 2019-05-31 MED ORDER — FENTANYL CITRATE (PF) 100 MCG/2ML IJ SOLN: 25.0000 ug | INTRAMUSCULAR | Status: DC | PRN

## 2019-05-31 MED ORDER — ACETAMINOPHEN 325 MG PO TABS: 650.0000 mg | ORAL_TABLET | Freq: Four times a day (QID) | ORAL | Status: DC | PRN

## 2019-05-31 MED ORDER — ONDANSETRON HCL 4 MG PO TABS: 4.0000 mg | ORAL_TABLET | Freq: Four times a day (QID) | ORAL | Status: DC | PRN

## 2019-05-31 MED ORDER — DIPHENHYDRAMINE HCL 50 MG/ML IJ SOLN
12.5000 mg | Freq: Once | INTRAMUSCULAR | Status: AC
  Administered 2019-05-31: 12.5 mg via INTRAVENOUS
  Filled 2019-05-31: qty 1

## 2019-05-31 MED ORDER — CEFAZOLIN SODIUM-DEXTROSE 2-4 GM/100ML-% IV SOLN: 2.0000 g | INTRAVENOUS | Status: AC

## 2019-05-31 MED ORDER — INSULIN ASPART 100 UNIT/ML ~~LOC~~ SOLN
0.0000 [IU] | SUBCUTANEOUS | Status: DC
  Administered 2019-05-31: 1 [IU] via SUBCUTANEOUS
  Administered 2019-05-31: 2 [IU] via SUBCUTANEOUS
  Administered 2019-05-31: 7 [IU] via SUBCUTANEOUS
  Administered 2019-06-01: 5 [IU] via SUBCUTANEOUS
  Administered 2019-06-01 – 2019-06-02 (×4): 2 [IU] via SUBCUTANEOUS
  Administered 2019-06-02: 1 [IU] via SUBCUTANEOUS
  Administered 2019-06-02: 2 [IU] via SUBCUTANEOUS

## 2019-05-31 MED ORDER — ACETAMINOPHEN 650 MG RE SUPP: 650.0000 mg | Freq: Four times a day (QID) | RECTAL | Status: DC | PRN

## 2019-05-31 MED ORDER — ALTEPLASE 2 MG IJ SOLR
INTRAMUSCULAR | Status: AC
  Filled 2019-05-31: qty 4

## 2019-05-31 MED ORDER — SODIUM CHLORIDE 0.9% FLUSH: 3.0000 mL | Freq: Two times a day (BID) | INTRAVENOUS | Status: DC

## 2019-05-31 MED ORDER — SODIUM CHLORIDE 0.9 % IV SOLN
1.0000 g | Freq: Every day | INTRAVENOUS | Status: DC
  Administered 2019-06-01: 1 g via INTRAVENOUS
  Filled 2019-05-31 (×2): qty 1

## 2019-05-31 MED ORDER — SODIUM CHLORIDE 0.9% FLUSH: 3.0000 mL | INTRAVENOUS | Status: DC | PRN

## 2019-05-31 MED ORDER — BISACODYL 5 MG PO TBEC: 5.0000 mg | DELAYED_RELEASE_TABLET | Freq: Once | ORAL | Status: DC

## 2019-05-31 MED ORDER — NYSTATIN 100000 UNIT/ML MT SUSP
500000.0000 [IU] | Freq: Three times a day (TID) | OROMUCOSAL | Status: DC
  Administered 2019-05-31 – 2019-06-01 (×2): 500000 [IU] via ORAL
  Filled 2019-05-31 (×5): qty 5

## 2019-05-31 MED ORDER — INSULIN GLARGINE 100 UNIT/ML ~~LOC~~ SOLN
15.0000 [IU] | Freq: Every day | SUBCUTANEOUS | Status: DC
  Administered 2019-05-31 – 2019-06-01 (×2): 15 [IU] via SUBCUTANEOUS
  Filled 2019-05-31 (×3): qty 0.15

## 2019-05-31 MED ORDER — FENTANYL CITRATE (PF) 100 MCG/2ML IJ SOLN
50.0000 ug | INTRAMUSCULAR | Status: DC | PRN
  Administered 2019-05-31: 75 ug via INTRAMUSCULAR

## 2019-05-31 MED ORDER — POLYETHYLENE GLYCOL 3350 17 G PO PACK: 17.0000 g | PACK | Freq: Every day | ORAL | Status: DC | PRN

## 2019-05-31 MED ORDER — ONDANSETRON HCL 4 MG/2ML IJ SOLN: 4.0000 mg | Freq: Four times a day (QID) | INTRAMUSCULAR | Status: DC | PRN

## 2019-05-31 MED ORDER — HYDRALAZINE HCL 20 MG/ML IJ SOLN
10.0000 mg | Freq: Once | INTRAMUSCULAR | Status: DC
  Filled 2019-05-31: qty 1

## 2019-05-31 MED ORDER — VANCOMYCIN VARIABLE DOSE PER UNSTABLE RENAL FUNCTION (PHARMACIST DOSING): Status: DC

## 2019-05-31 MED ORDER — FENTANYL CITRATE (PF) 100 MCG/2ML IJ SOLN
25.0000 ug | INTRAMUSCULAR | Status: DC | PRN
  Administered 2019-05-31 – 2019-06-02 (×9): 50 ug via INTRAVENOUS
  Filled 2019-05-31 (×9): qty 2

## 2019-05-31 MED ORDER — FENTANYL CITRATE (PF) 100 MCG/2ML IJ SOLN
100.0000 ug | Freq: Once | INTRAMUSCULAR | Status: DC
  Filled 2019-05-31 (×2): qty 2

## 2019-05-31 MED ORDER — SODIUM CHLORIDE 0.9% FLUSH
3.0000 mL | Freq: Two times a day (BID) | INTRAVENOUS | Status: DC
  Administered 2019-05-31 – 2019-06-02 (×3): 3 mL via INTRAVENOUS

## 2019-05-31 MED ORDER — CHLORHEXIDINE GLUCONATE CLOTH 2 % EX PADS
6.0000 | MEDICATED_PAD | Freq: Every day | CUTANEOUS | Status: DC
  Administered 2019-05-31: 6 via TOPICAL

## 2019-05-31 MED ORDER — ALTEPLASE 2 MG IJ SOLR
4.0000 mg | Freq: Once | INTRAMUSCULAR | Status: AC
  Administered 2019-05-31: 4 mg

## 2019-05-31 MED ORDER — VANCOMYCIN HCL 10 G IV SOLR
2000.0000 mg | Freq: Once | INTRAVENOUS | Status: AC
  Administered 2019-05-31: 2000 mg via INTRAVENOUS
  Filled 2019-05-31 (×2): qty 2000

## 2019-05-31 NOTE — Progress Notes (Signed)
Patient admitted after midnight. See H & P. Hx CVA, DM HEN ESRD on peritoneal dialysis since 12/19 admitted with abdominal pain related to inability to drain PD catheter. gen surgery on board for removal of PD cath and VIR for cath insertion for HD per nephrology. Stable this am.    1. Peritoneal catheter dysfunction; possible peritonitis; ESRD Patient remains afebrile and hemodynamically stable. Nephrology input appreciated. Follow cultures and empiric abx for peritonitis. Blood cultures and antibiotics ordered from ED, will continue antibiotic and prophylactic antifungal. Cell count fluid with WBC greater than 10000, neurtraphil 84  2. Insulin-dependent DM  A1c was 9.9% in February. Managed with [name] and Novolog at home -obtain A1c -continue  Lantus and Novolog while in hospital    3. Hypertension BP 180/90 range in ED. Improved control this am. Of note she had been admitted to [facility] with hypotension in March, had dialysis parameters adjusted, and was started on midodrine  - Use labetalol IVP's as needed for now  -monitor closely  4. Anemia  Hgb is 10.2 on admission, similar to priors and with no bleeding   PE Gen: awake alter no acute distress CV: rrr no mgr no LE edeam Resp: normal effort BS clear Abd: obese slightly tight +BS PD cath intact. Non-tender  Dyanne Carrel, NP

## 2019-05-31 NOTE — Progress Notes (Signed)
Patient returned from IR. IR unable to perform dialysis catheter placement tonight. Patient will return tomorrow morning for procedure per IR.

## 2019-05-31 NOTE — Progress Notes (Signed)
Renal Navigator received notification from Renal PA/M. Bergman that patient needs OP HD referral initiated for modality change from home PD to in-center HD.  Renal Navigator spoke with patient who states the closest clinic to her home is [facility]. Referral submitted to [facility] clinic. Renal Navigator will follow closely and update Nephrology and patient once seat schedule is obtained.  Alphonzo Cruise, [place] Renal Navigator [phone]

## 2019-05-31 NOTE — Consult Note (Signed)
Chief Complaint: Patient was seen in consultation today for tunneled dialysis catheter placement Chief Complaint  Patient presents with  . Abdominal Pain   at the request of Dr Corliss Marcus  Supervising Physician: Daryll Brod  Patient Status: [facility] - In-pt  History of Present Illness: Heather [name] is a 48 y.o. female   ESRD-- peritoneal dialysis catheter malfunction Transition to HD per Nephrology PD cath to be removed in OR   Scheduled now for tunneled dialysis catheter placement    Past Medical History:  Diagnosis Date  . Antiplatelet or antithrombotic long-term use: Plavix 06/30/2017  . B12 deficiency 05/10/2014  . Blood transfusion without reported diagnosis   . Chronic constipation   . CVA (cerebral vascular accident) ([place]), nonhemorrhagic, inferior right cerebellum 12/03/2017  . Cyst of left ovary 01/12/2018  . Diabetic neuropathy, painful ([place]), on low dose Gabapentin 07/13/2013  . DM (diabetes mellitus), type 2, uncontrolled, with renal complications ([place]) 60/45/4098  . DM2 (diabetes mellitus, type 2) ([place])   . Dysfunction of left eustachian tube, with pusatile tinnitus 11/24/2017  . Dyslipidemia associated with type 2 diabetes mellitus ([place]) 06/25/2016  . ESRD (end stage renal disease) on dialysis [facility])    On hemodialysis in July 2019 via [facility] then switched to [facility] in Nov 2019.   [name] ESRD with anemia ([place])   . Fibromyalgia   . Gastroparesis due to DM   . GERD (gastroesophageal reflux disease)   . Hypertension associated with diabetes ([place]) 02/19/2011  . IBS (irritable bowel syndrome)   . Left-sided weakness 12/10/2017   .  [name] Leiomyoma of uterus   . Pancreatitis   . Proliferative diabetic retinopathy ([place]) 09/29/2015  . 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 Reactive depression 12/10/2017   .  [name] Right-sided low back pain without sciatica 12/08/2015  . Secondary hyperthyroidism 11/27/2013  . Steal syndrome dialysis vascular access ([place]) 02/17/2018  .  Thromboembolism ([place]) 04/05/2018  . Upper airway cough syndrome, with recs to stay off ACE and take Pepcid q hs 11/15/2016    Past Surgical History:  Procedure Laterality Date  . ARTERY REPAIR Right 02/17/2018   Procedure: EXPLORATION OF RIGHT BRACHIAL ARTERY;  Surgeon: Conrad [place], MD;  Location: [place];  Service: Vascular;  Laterality: Right;  . ARTERY REPAIR Right 02/17/2018   Procedure: BRACHIAL ARTERY EXPLORATION AND TRHOMBECTOMY;  Surgeon: Conrad Ovilla, MD;  Location: [facility] OR;  Service: Vascular;  Laterality: Right;  . AV FISTULA PLACEMENT Left 05/09/2017   Procedure: INSERTION OF ARTERIOVENOUS (AV) GRAFT ARM (ARTEGRAFT);  Surgeon: Conrad Lutcher, MD;  Location: [facility] OR;  Service: Vascular;  Laterality: Left;  . AV FISTULA PLACEMENT Right 02/17/2018   Procedure: INSERTION OF ARTERIOVENOUS (AV) GORE-TEX GRAFT ARM RIGHT UPPER ARM;  Surgeon: Conrad Woodburn, MD;  Location: [place];  Service: Vascular;  Laterality: Right;  . BASCILIC VEIN TRANSPOSITION Left 08/31/2016   Procedure: BASILIC VEIN TRANSPOSITION FIRST STAGE;  Surgeon: Conrad [place], MD;  Location: [place];  Service: Vascular;  Laterality: Left;  . CENTRAL VENOUS CATHETER INSERTION Left 07/24/2018   Procedure: INSERTION CENTRAL LINE ADULT;  Surgeon: Conrad [place], MD;  Location: [place];  Service: Vascular;  Laterality: Left;  . EYE SURGERY     secondary to diabetic retinopathy   . FOOT SURGERY Right    t"ook bone out- maybe hammer toe"  . INSERTION OF DIALYSIS CATHETER Left 07/24/2018   Procedure: INSERTION OF TUNNELED DIALYSIS CATHETER;  Surgeon: Conrad [place],  MD;  Location: [place];  Service: Vascular;  Laterality: Left;  . IR FLUORO GUIDE CV LINE RIGHT  07/21/2018  . IR US GUIDE VASC ACCESS RIGHT  07/21/2018  . LIGATION OF ARTERIOVENOUS  FISTULA Left 05/09/2017   Procedure: LIGATION OF ARTERIOVENOUS  FISTULA;  Surgeon: Conrad Smith, MD;  Location: [place];  Service: Vascular;  Laterality: Left;  . LOOP RECORDER INSERTION N/A 12/05/2017    Procedure: LOOP RECORDER INSERTION;  Surgeon: Evans Lance, MD;  Location: [place] CV LAB;  Service: Cardiovascular;  Laterality: N/A;  . MYOMECTOMY    . REMOVAL OF GRAFT Right 02/17/2018   Procedure: REMOVAL OF RIGHT UPPER ARM ARTERIOVENOUS GRAFT;  Surgeon: Conrad [place], MD;  Location: [place];  Service: Vascular;  Laterality: Right;  . REVISION OF ARTERIOVENOUS GORETEX GRAFT Left 07/24/2018   Procedure: REDO ARTERIOVENOUS GORETEX GRAFT;  Surgeon: Conrad Dalton, MD;  Location: [place];  Service: Vascular;  Laterality: Left;  . TEE WITHOUT CARDIOVERSION N/A 12/05/2017   Procedure: TRANSESOPHAGEAL ECHOCARDIOGRAM (TEE);  Surgeon: Sanda Klein, MD;  Location: [facility] ENDOSCOPY;  Service: Cardiovascular;  Laterality: N/A;  . UPPER EXTREMITY VENOGRAPHY Left 07/13/2018   Procedure: UPPER EXTREMITY VENOGRAPHY - Central & Left Arm;  Surgeon: Conrad Greenwood, MD;  Location: [place] CV LAB;  Service: Cardiovascular;  Laterality: Left;  . UTERINE FIBROID SURGERY    . VITRECTOMY Bilateral     Allergies: Ibuprofen; Dilaudid [hydromorphone hcl]; and Tramadol  Medications: Prior to Admission medications   Medication Sig Start Date End Date Taking? Authorizing Provider  acetaminophen (TYLENOL) 325 MG tablet Take 1-2 tablets (325-650 mg total) by mouth every 4 (four) hours as needed for mild pain. 02/23/19   Love, Ivan Anchors, PA-C  blood glucose meter kit and supplies KIT Dispense based on patient and insurance preference. Use up to four times daily as directed. (FOR ICD-9 250.00, 250.01). 04/25/19   Briscoe Deutscher, DO  Continuous Blood Gluc Sensor (FREESTYLE LIBRE 14 DAY SENSOR) MISC 1 patch by Does not apply route every 14 (fourteen) days. 04/11/19   Briscoe Deutscher, DO  cyclobenzaprine (FLEXERIL) 5 MG tablet 1/2 tab bid for 10 days 05/28/19   Briscoe Deutscher, DO  gabapentin (NEURONTIN) 100 MG capsule Take 1 capsule (100 mg total) by mouth daily. 04/11/19   Briscoe Deutscher, DO  gentamicin cream (GARAMYCIN) 0.1 %  Apply 1 application topically daily. 02/23/19   Love, Ivan Anchors, PA-C  insulin aspart (NOVOLOG FLEXPEN) 100 UNIT/ML FlexPen Inject into the skin.    [provider]  insulin degludec (TRESIBA FLEXTOUCH) 100 UNIT/ML SOPN FlexTouch Pen Inject into the skin. 03/20/19   [provider]  lanthanum (FOSRENOL) 1000 MG chewable tablet Chew 1 tablet (1,000 mg total) by mouth 3 (three) times daily with meals. 02/23/19   Love, Ivan Anchors, PA-C  lubiprostone (AMITIZA) 24 MCG capsule Take 1 capsule (24 mcg total) by mouth 2 (two) times daily with a meal. 02/23/19   Love, Ivan Anchors, PA-C  midodrine (PROAMATINE) 5 MG tablet Take 1 tablet (5 mg total) by mouth 2 (two) times daily with a meal. 04/11/19   Briscoe Deutscher, DO  multivitamin (RENA-VIT) TABS tablet Take 1 tablet by mouth at bedtime. 02/23/19   Love, Ivan Anchors, PA-C  potassium chloride (K-DUR) 10 MEQ tablet TAKE 4 TABLETS BY MOUTH EVERY DAY 05/19/19   Briscoe Deutscher, DO  simvastatin (ZOCOR) 10 MG tablet Take 0.5 tablets (5 mg total) by mouth at bedtime. 1/2 po q hs - GFR  14 04/10/19   Briscoe Deutscher, DO     Family History  Problem Relation Age of Onset  . Colon polyps Mother   . Diabetes Mother   . Heart murmur Father        history essentially unknown  . Hypertension Father   . Diabetes Sister   . Kidney failure Sister        kidney transplant  . Diabetes Brother   . Retinal degeneration Brother   . Heart murmur Son   . Stroke Paternal Grandmother   . Diabetes Sister     Social History   Socioeconomic History  . Marital status: Married    Spouse name: Not on file  . Number of children: 1  . Years of education: college  . Highest education level: Not on file  Occupational History  . Occupation: Armed forces technical officer: [employer]  Social Needs  . Financial resource strain: Not on file  . Food insecurity:    Worry: Not on file    Inability: Not on file  . Transportation needs:    Medical: Not on file     Non-medical: Not on file  Tobacco Use  . Smoking status: Never Smoker  . Smokeless tobacco: Never Used  Substance and Sexual Activity  . Alcohol use: No    Alcohol/week: 0.0 standard drinks  . Drug use: No  . Sexual activity: Yes    Partners: Male    Birth control/protection: None  Lifestyle  . Physical activity:    Days per week: Not on file    Minutes per session: Not on file  . Stress: Not on file  Relationships  . Social connections:    Talks on phone: Not on file    Gets together: Not on file    Attends religious service: Not on file    Active member of club or organization: Not on file    Attends meetings of clubs or organizations: Not on file    Relationship status: Not on file  Other Topics Concern  . Not on file  Social History Narrative   Patient lives with fiance. She has 1 grown son. Works full-time, she Paramedic for attorney.   Caffeine Use: 2 cups daily; sodas occasionally   She has 7 grandchildren     Review of Systems: A 12 point ROS discussed and pertinent positives are indicated in the HPI above.  All other systems are negative.  Review of Systems  Constitutional: Positive for activity change and fatigue. Negative for fever.  Respiratory: Negative for cough and shortness of breath.   Cardiovascular: Negative for chest pain.  Gastrointestinal: Negative for abdominal pain.  Neurological: Positive for weakness.  Psychiatric/Behavioral: Negative for behavioral problems and confusion.    Vital Signs: BP 119/70 (BP Location: Left Arm)   Pulse 70   Temp 97.9 F (36.6 C) (Oral)   Resp 20   Ht '5\' 9"'  (1.753 m)   Wt 242 lb 4.6 oz (109.9 kg)   SpO2 99%   BMI 35.78 kg/m   Physical Exam Vitals signs reviewed.  Cardiovascular:     Rate and Rhythm: Regular rhythm.  Pulmonary:     Effort: Pulmonary effort is normal.     Breath sounds: Normal breath sounds.  Abdominal:     Palpations: Abdomen is soft.     Tenderness: There is no abdominal  tenderness.  Skin:    General: Skin is warm and dry.  Neurological:  Mental Status: [name] alert and oriented to person, place, and time.  Psychiatric:        Behavior: Behavior normal.     Imaging: Dg Ribs Unilateral W/chest Left  Result Date: 05/23/2019 CLINICAL DATA:  Left side rib pain.  Fall. EXAM: LEFT RIBS AND CHEST - 3+ VIEW COMPARISON:  02/12/2019 FINDINGS: Left basilar atelectasis. Heart is normal size. No confluent opacity on the right. No effusions or pneumothorax. Posterior left 7th fracture noted. IMPRESSION: Left posterior 7th rib fracture. Associated left basilar atelectasis. No effusion or pneumothorax. Electronically Signed   By: Rolm Baptise M.D.   On: 05/23/2019 16:42   Dg Abd 1 View  Result Date: 05/30/2019 CLINICAL DATA:  Check peritoneal dialysis catheter placement EXAM: ABDOMEN - 1 VIEW COMPARISON:  None. FINDINGS: Peritoneal dialysis catheter is noted in the deep pelvis. No acute bony or soft tissue abnormality is noted. Mild retained fecal material is noted consistent with a degree of constipation without obstructive change. IMPRESSION: Peritoneal dialysis catheter appears within normal limits. Changes of mild constipation. Electronically Signed   By: Inez Catalina M.D.   On: 05/30/2019 11:40    Labs:  CBC: Recent Labs    02/23/19 0646 03/20/19 05/30/19 1816 05/31/19 0355  WBC 10.4 8.4 9.1 9.0  HGB 10.4* 11.2* 10.2* 9.3*  HCT 33.0* 34* 34.8* 31.2*  PLT PLATELET CLUMPS NOTED ON SMEAR, COUNT APPEARS ADEQUATE 231 322 305    COAGS: Recent Labs    07/11/18 1821 05/31/19 0519  INR 1.01 1.1  APTT 30 27    BMP: Recent Labs    02/15/19 1323 02/21/19 0523 03/20/19 05/30/19 1816 05/31/19 0355  NA 136 136 133* 133* 133*  K 3.5 3.1* 2.9* 3.7 3.8  CL 97* 97*  --  96* 98  CO2 25 25  --  21* 20*  GLUCOSE 193* 142*  --  393* 314*  BUN 35* 35* 41* 37* 37*  CALCIUM 8.1* 8.0*  --  7.6* 7.3*  CREATININE 9.45* 11.11* 8.1* 8.13* 8.08*  GFRNONAA 4* 4*  --   5* 5*  GFRAA 5* 4*  --  6* 6*    LIVER FUNCTION TESTS: Recent Labs    07/11/18 1821  07/21/18 1931  02/05/19 0932 02/07/19 0625 02/13/19 1038 02/15/19 1323 02/21/19 0523 05/30/19 1816  BILITOT 0.7  --   --   --  1.0 0.6  --   --   --  0.8  AST 18  --   --   --  13* 31  --   --   --  14*  ALT 19  --  19  --  16 31  --   --   --  14  ALKPHOS 72  --   --   --  129* 106  --   --   --  140*  PROT 6.6  --   --   --  7.3 6.2*  --   --   --  7.7  ALBUMIN 2.6*   < >  --    < > 2.3* 2.0* 2.2* 2.1* 2.2* 2.6*   < > = values in this interval not displayed.    TUMOR MARKERS: No results for input(s): AFPTM, CEA, CA199, CHROMGRNA in the last 8760 hours.  Assessment and Plan:  ESRD PD cath malfunction Transition to HD--- tunneled catheter placement in IR today Risks and benefits discussed with the patient including, but not limited to bleeding, infection, vascular injury, pneumothorax which may require  chest tube placement, air embolism or even death  All of the patient's questions were answered, patient is agreeable to proceed. Consent signed and in chart.   Thank you for this interesting consult.  I greatly enjoyed meeting LIESA TSAN and look forward to participating in their care.  A copy of this report was sent to the requesting provider on this date.  Electronically Signed: Lavonia Drafts, PA-C 05/31/2019, 9:51 AM   I spent a total of 40 Minutes    in face to face in clinical consultation, greater than 50% of which was counseling/coordinating care for tunneled dialysis catheter placement

## 2019-05-31 NOTE — Progress Notes (Signed)
Arrived to patient bedside to drain PD fluid amount drained less than 100cc alerted MD [facility].

## 2019-05-31 NOTE — ED Notes (Signed)
Iv team unable to get an iv at present

## 2019-05-31 NOTE — Plan of Care (Signed)

## 2019-05-31 NOTE — Progress Notes (Signed)
Patient transported off unit to IR for procedure.

## 2019-05-31 NOTE — Progress Notes (Signed)
Inpatient Diabetes Program Recommendations  AACE/ADA: New Consensus Statement on Inpatient Glycemic Control (2015)  Target Ranges:  Prepandial:   less than 140 mg/dL      Peak postprandial:   less than 180 mg/dL (1-2 hours)      Critically ill patients:  140 - 180 mg/dL   Lab Results  Component Value Date   GLUCAP 105 (H) 05/31/2019   HGBA1C 9.1 (H) 05/31/2019    Review of Glycemic Control Results for MATALYN, NAWAZ (MRN [id number]) as of 05/31/2019 14:39  Ref. Range 05/31/2019 03:42 05/31/2019 07:38 05/31/2019 11:53  Glucose-Capillary Latest Ref Range: 70 - 99 mg/dL 310 (H) 200 (H) 105 (H)   Diabetes history: DM 2 Outpatient Diabetes medications: Tresiba 30 units daily, Novolog (doses not listed) 8 units with breakfast, 6 units with lunch, 6 units with supper Current orders for Inpatient glycemic control:  Lantus 15 units q HS, Novolog sensitive q 4 hours  Inpatient Diabetes Program Recommendations:    Agree with current orders.  Will follow.   Thanks Adah Perl, RN, BC-ADM Inpatient Diabetes Coordinator Pager [phone] (8a-5p)

## 2019-05-31 NOTE — Progress Notes (Signed)
Patient arrived to 6N11 from ED and self ambulated to bed from stretcher. A&O x4 and patient was oriented to room and to call bell. Vitals signs as follows: B/P 178/80, pulse 92, O2 98 on room air, temp 98.6, and respiration 17. Paged on call MD Schorr about B/P. Patient also complaining of pain 4/10 in lower abdomen.

## 2019-05-31 NOTE — H&P (Signed)
History and Physical    Heather [name]:[id number] DOB: [birth year] DOA: 05/30/2019  PCP: Briscoe Deutscher, DO   Patient coming from: Home   Chief Complaint: Abdominal pain, PD catheter not draining   HPI: Heather [name] is a 48 y.o. female with medical history significant for hypertension, history of CVA with residual lower extremity weakness, fibromyalgia, insulin-dependent diabetes mellitus, and ESRD on peritoneal dialysis, now presenting to the emergency department for evaluation of abdominal pain and inability to drain her PD catheter.  Patient reports that she instilled dialysate at approximately 10 PM the night of 05/30/2019, developed some lower abdominal discomfort and is now unable to drain the fluid.  She denies any fevers, chills, shortness of breath, chest pain, or palpitations.  Denies any recent lower extremity swelling.  She has not been coughing and denies sick contacts.  ED Course: Upon arrival to the ED, patient is found to be afebrile, saturating well on room air, slightly tachycardic, and with stable blood pressure.  KUB with mild constipation and PD catheter appears to be within the normal limits.  Chemistry panel is notable for sodium 133, bicarbonate 21, glucose 393, BUN 37, and creatinine 8.13.  CBC is notable for stable normocytic anemia with hemoglobin 10.2.  Nephrology was consulted by the ED physician and recommended medical admission, blood cultures, and empiric antibiotics for suspected peritonitis.  Review of Systems:  All other systems reviewed and apart from HPI, are negative.  Past Medical History:  Diagnosis Date  . Antiplatelet or antithrombotic long-term use: Plavix 06/30/2017  . B12 deficiency 05/10/2014  . Blood transfusion without reported diagnosis   . Chronic constipation   . CVA (cerebral vascular accident) ([place]), nonhemorrhagic, inferior right cerebellum 12/03/2017  . Cyst of left ovary 01/12/2018  . Diabetic neuropathy, painful ([place]), on  low dose Gabapentin 07/13/2013  . DM (diabetes mellitus), type 2, uncontrolled, with renal complications ([place]) 44/12/270  . DM2 (diabetes mellitus, type 2) ([place])   . Dysfunction of left eustachian tube, with pusatile tinnitus 11/24/2017  . Dyslipidemia associated with type 2 diabetes mellitus ([place]) 06/25/2016  . ESRD (end stage renal disease) on dialysis [facility])    On hemodialysis in July 2019 via [facility] then switched to [facility] in Nov 2019.   [name] ESRD with anemia ([place])   . Fibromyalgia   . Gastroparesis due to DM   . GERD (gastroesophageal reflux disease)   . Hypertension associated with diabetes ([place]) 02/19/2011  . IBS (irritable bowel syndrome)   . Left-sided weakness 12/10/2017   .  [name] Leiomyoma of uterus   . Pancreatitis   . Proliferative diabetic retinopathy ([place]) 09/29/2015  . 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 Reactive depression 12/10/2017   .  [name] Right-sided low back pain without sciatica 12/08/2015  . Secondary hyperthyroidism 11/27/2013  . Steal syndrome dialysis vascular access ([place]) 02/17/2018  . Thromboembolism ([place]) 04/05/2018  . Upper airway cough syndrome, with recs to stay off ACE and take Pepcid q hs 11/15/2016    Past Surgical History:  Procedure Laterality Date  . ARTERY REPAIR Right 02/17/2018   Procedure: EXPLORATION OF RIGHT BRACHIAL ARTERY;  Surgeon: Conrad Wittmann, MD;  Location: [place];  Service: Vascular;  Laterality: Right;  . ARTERY REPAIR Right 02/17/2018   Procedure: BRACHIAL ARTERY EXPLORATION AND TRHOMBECTOMY;  Surgeon: Conrad F[place] Hill, MD;  Location: [facility] OR;  Service: Vascular;  Laterality: Right;  . AV FISTULA PLACEMENT Left 05/09/2017   Procedure: INSERTION OF ARTERIOVENOUS (AV) GRAFT ARM (ARTEGRAFT);  Surgeon: Conrad [place] Grove, MD;  Location: [facility] OR;  Service: Vascular;  Laterality: Left;  . AV FISTULA PLACEMENT Right 02/17/2018   Procedure: INSERTION OF ARTERIOVENOUS (AV) GORE-TEX GRAFT ARM RIGHT UPPER ARM;  Surgeon: Conrad [place] Rock, MD;  Location: [place];   Service: Vascular;  Laterality: Right;  . BASCILIC VEIN TRANSPOSITION Left 08/31/2016   Procedure: BASILIC VEIN TRANSPOSITION FIRST STAGE;  Surgeon: Conrad [place], MD;  Location: [place];  Service: Vascular;  Laterality: Left;  . CENTRAL VENOUS CATHETER INSERTION Left 07/24/2018   Procedure: INSERTION CENTRAL LINE ADULT;  Surgeon: Conrad Scotland, MD;  Location: [place];  Service: Vascular;  Laterality: Left;  . EYE SURGERY     secondary to diabetic retinopathy   . FOOT SURGERY Right    t"ook bone out- maybe hammer toe"  . INSERTION OF DIALYSIS CATHETER Left 07/24/2018   Procedure: INSERTION OF TUNNELED DIALYSIS CATHETER;  Surgeon: Conrad Esperance, MD;  Location: [place];  Service: Vascular;  Laterality: Left;  . IR FLUORO GUIDE CV LINE RIGHT  07/21/2018  . IR US GUIDE VASC ACCESS RIGHT  07/21/2018  . LIGATION OF ARTERIOVENOUS  FISTULA Left 05/09/2017   Procedure: LIGATION OF ARTERIOVENOUS  FISTULA;  Surgeon: Conrad Yamhill, MD;  Location: [place];  Service: Vascular;  Laterality: Left;  . LOOP RECORDER INSERTION N/A 12/05/2017   Procedure: LOOP RECORDER INSERTION;  Surgeon: Evans Lance, MD;  Location: [place] CV LAB;  Service: Cardiovascular;  Laterality: N/A;  . MYOMECTOMY    . REMOVAL OF GRAFT Right 02/17/2018   Procedure: REMOVAL OF RIGHT UPPER ARM ARTERIOVENOUS GRAFT;  Surgeon: Conrad [place] Hill, MD;  Location: [place];  Service: Vascular;  Laterality: Right;  . REVISION OF ARTERIOVENOUS GORETEX GRAFT Left 07/24/2018   Procedure: REDO ARTERIOVENOUS GORETEX GRAFT;  Surgeon: Conrad [place], MD;  Location: [place];  Service: Vascular;  Laterality: Left;  . TEE WITHOUT CARDIOVERSION N/A 12/05/2017   Procedure: TRANSESOPHAGEAL ECHOCARDIOGRAM (TEE);  Surgeon: Sanda Klein, MD;  Location: [facility] ENDOSCOPY;  Service: Cardiovascular;  Laterality: N/A;  . UPPER EXTREMITY VENOGRAPHY Left 07/13/2018   Procedure: UPPER EXTREMITY VENOGRAPHY - Central & Left Arm;  Surgeon: Conrad [place], MD;  Location: [place] CV LAB;   Service: Cardiovascular;  Laterality: Left;  . UTERINE FIBROID SURGERY    . VITRECTOMY Bilateral      reports that she has never smoked. She has never used smokeless tobacco. She reports that she does not drink alcohol or use drugs.  Allergies  Allergen Reactions  . Ibuprofen Other (See Comments)    CKD stage 3. Should avoid.  . Dilaudid [Hydromorphone Hcl] Itching and Other (See Comments)    Can take with Benadryl.  . Tramadol Itching    Family History  Problem Relation Age of Onset  . Colon polyps Mother   . Diabetes Mother   . Heart murmur Father        history essentially unknown  . Hypertension Father   . Diabetes Sister   . Kidney failure Sister        kidney transplant  . Diabetes Brother   . Retinal degeneration Brother   . Heart murmur Son   . Stroke Paternal Grandmother   . Diabetes Sister      Prior to Admission medications   Medication Sig Start Date End Date Taking? Authorizing Provider  acetaminophen (TYLENOL) 325 MG tablet Take 1-2 tablets (325-650 mg total) by mouth every 4 (four) hours as needed for  mild pain. 02/23/19   Love, Ivan Anchors, PA-C  blood glucose meter kit and supplies KIT Dispense based on patient and insurance preference. Use up to four times daily as directed. (FOR ICD-9 250.00, 250.01). 04/25/19   Briscoe Deutscher, DO  Continuous Blood Gluc Sensor (FREESTYLE LIBRE 14 DAY SENSOR) MISC 1 patch by Does not apply route every 14 (fourteen) days. 04/11/19   Briscoe Deutscher, DO  cyclobenzaprine (FLEXERIL) 5 MG tablet 1/2 tab bid for 10 days 05/28/19   Briscoe Deutscher, DO  gabapentin (NEURONTIN) 100 MG capsule Take 1 capsule (100 mg total) by mouth daily. 04/11/19   Briscoe Deutscher, DO  gentamicin cream (GARAMYCIN) 0.1 % Apply 1 application topically daily. 02/23/19   Love, Ivan Anchors, PA-C  insulin aspart (NOVOLOG FLEXPEN) 100 UNIT/ML FlexPen Inject into the skin.    [provider]  insulin degludec (TRESIBA FLEXTOUCH) 100 UNIT/ML SOPN FlexTouch Pen  Inject into the skin. 03/20/19   [provider]  lanthanum (FOSRENOL) 1000 MG chewable tablet Chew 1 tablet (1,000 mg total) by mouth 3 (three) times daily with meals. 02/23/19   Love, Ivan Anchors, PA-C  lubiprostone (AMITIZA) 24 MCG capsule Take 1 capsule (24 mcg total) by mouth 2 (two) times daily with a meal. 02/23/19   Love, Ivan Anchors, PA-C  midodrine (PROAMATINE) 5 MG tablet Take 1 tablet (5 mg total) by mouth 2 (two) times daily with a meal. 04/11/19   Briscoe Deutscher, DO  multivitamin (RENA-VIT) TABS tablet Take 1 tablet by mouth at bedtime. 02/23/19   Love, Ivan Anchors, PA-C  potassium chloride (K-DUR) 10 MEQ tablet TAKE 4 TABLETS BY MOUTH EVERY DAY 05/19/19   Briscoe Deutscher, DO  simvastatin (ZOCOR) 10 MG tablet Take 0.5 tablets (5 mg total) by mouth at bedtime. 1/2 po q hs - GFR 14 04/10/19   Briscoe Deutscher, DO    Physical Exam: Vitals:   05/30/19 1753 05/31/19 0046 05/31/19 0100 05/31/19 0130  BP: (!) 176/82 (!) 185/95 (!) 182/91 (!) 179/85  Pulse: (!) 105  97 96  Resp: 14     Temp: 98.4 F (36.9 C)     TempSrc: Oral     SpO2: 100%  99% 100%    Constitutional: NAD, calm  Eyes: PERTLA, lids and conjunctivae normal ENMT: Mucous membranes are moist. Posterior pharynx clear of any exudate or lesions.   Neck: normal, supple, no masses, no thyromegaly Respiratory:  no wheezing, no crackles. No accessory muscle use.  Cardiovascular: S1 & S2 heard, regular rate and rhythm. No extremity edema.   Abdomen: soft, generalized tenderness, no rebound pain or guarding. Bowel sounds active.  Musculoskeletal: no clubbing / cyanosis. No joint deformity upper and lower extremities.   Skin: no significant rashes, lesions, ulcers. Warm, dry, well-perfused. Neurologic: No gross facial asymmetry. Sensation to light touch intact. Moving all extremities.  Psychiatric: Alert and oriented to person, place, and situation. Pleasant, cooperative.    Labs on Admission: I have personally reviewed following  labs and imaging studies  CBC: Recent Labs  Lab 05/30/19 1816  WBC 9.1  HGB 10.2*  HCT 34.8*  MCV 100.0  PLT 295   Basic Metabolic Panel: Recent Labs  Lab 05/30/19 1816  NA 133*  K 3.7  CL 96*  CO2 21*  GLUCOSE 393*  BUN 37*  CREATININE 8.13*  CALCIUM 7.6*   GFR: Estimated Creatinine Clearance: 10.8 mL/min (A) (by C-G formula based on SCr of 8.13 mg/dL (H)). Liver Function Tests: Recent Labs  Lab 05/30/19 1816  AST 14*  ALT 14  ALKPHOS 140*  BILITOT 0.8  PROT 7.7  ALBUMIN 2.6*   Recent Labs  Lab 05/30/19 1816  LIPASE 65*   No results for input(s): AMMONIA in the last 168 hours. Coagulation Profile: No results for input(s): INR, PROTIME in the last 168 hours. Cardiac Enzymes: No results for input(s): CKTOTAL, CKMB, CKMBINDEX, TROPONINI in the last 168 hours. BNP (last 3 results) No results for input(s): PROBNP in the last 8760 hours. HbA1C: No results for input(s): HGBA1C in the last 72 hours. CBG: No results for input(s): GLUCAP in the last 168 hours. Lipid Profile: No results for input(s): CHOL, HDL, LDLCALC, TRIG, CHOLHDL, LDLDIRECT in the last 72 hours. Thyroid Function Tests: No results for input(s): TSH, T4TOTAL, FREET4, T3FREE, THYROIDAB in the last 72 hours. Anemia Panel: No results for input(s): VITAMINB12, FOLATE, FERRITIN, TIBC, IRON, RETICCTPCT in the last 72 hours. Urine analysis:    Component Value Date/Time   COLORURINE YELLOW 05/31/2019 0041   APPEARANCEUR CLOUDY (A) 05/31/2019 0041   LABSPEC 1.013 05/31/2019 0041   PHURINE 5.0 05/31/2019 0041   GLUCOSEU >=500 (A) 05/31/2019 0041   HGBUR SMALL (A) 05/31/2019 0041   BILIRUBINUR NEGATIVE 05/31/2019 0041   BILIRUBINUR Negative 07/29/2017 0802   BILIRUBINUR neg 08/08/2013   KETONESUR NEGATIVE 05/31/2019 0041   PROTEINUR >=300 (A) 05/31/2019 0041   UROBILINOGEN 0.2 07/29/2017 0802   UROBILINOGEN 0.2 09/07/2015 1230   NITRITE NEGATIVE 05/31/2019 0041   LEUKOCYTESUR TRACE (A)  05/31/2019 0041   Sepsis Labs: _0 (procalcitonin:4,lacticidven:4) ) Recent Results (from the past 240 hour(s))  SARS Coronavirus 2 (CEPHEID - Performed in [facility] hospital lab), Hosp Order     Status: None   Collection Time: 05/31/19  1:36 AM  Result Value Ref Range Status   SARS Coronavirus 2 NEGATIVE NEGATIVE Final    Comment: (NOTE) If result is NEGATIVE SARS-CoV-2 target nucleic acids are NOT DETECTED. The SARS-CoV-2 RNA is generally detectable in upper and lower  respiratory specimens during the acute phase of infection. The lowest  concentration of SARS-CoV-2 viral copies this assay can detect is 250  copies / mL. A negative result does not preclude SARS-CoV-2 infection  and should not be used as the sole basis for treatment or other  patient management decisions.  A negative result may occur with  improper specimen collection / handling, submission of specimen other  than nasopharyngeal swab, presence of viral mutation(s) within the  areas targeted by this assay, and inadequate number of viral copies  (<250 copies / mL). A negative result must be combined with clinical  observations, patient history, and epidemiological information. If result is POSITIVE SARS-CoV-2 target nucleic acids are DETECTED. The SARS-CoV-2 RNA is generally detectable in upper and lower  respiratory specimens dur ing the acute phase of infection.  Positive  results are indicative of active infection with SARS-CoV-2.  Clinical  correlation with patient history and other diagnostic information is  necessary to determine patient infection status.  Positive results do  not rule out bacterial infection or co-infection with other viruses. If result is PRESUMPTIVE POSTIVE SARS-CoV-2 nucleic acids MAY BE PRESENT.   A presumptive positive result was obtained on the submitted specimen  and confirmed on repeat testing.  While 2019 novel coronavirus  (SARS-CoV-2) nucleic acids may be present in the  submitted sample  additional confirmatory testing may be necessary for epidemiological  and / or clinical management purposes  to differentiate between  SARS-CoV-2 and other Sarbecovirus currently known to  infect humans.  If clinically indicated additional testing with an alternate test  methodology [phone]) is advised. The SARS-CoV-2 RNA is generally  detectable in upper and lower respiratory sp ecimens during the acute  phase of infection. The expected result is Negative. Fact Sheet for Patients:  StrictlyIdeas.no Fact Sheet for Healthcare Providers: BankingDealers.co.za This test is not yet approved or cleared by the [place] FDA and has been authorized for detection and/or diagnosis of SARS-CoV-2 by FDA under an Emergency Use Authorization (EUA).  This EUA will remain in effect (meaning this test can be used) for the duration of the COVID-19 declaration under Section 564(b)(1) of the Act, 21 U.S.C. section 360bbb-3(b)(1), unless the authorization is terminated or revoked sooner. Performed at [facility], [place] [address] [place], [place] [address]      Radiological Exams on Admission: Dg Abd 1 View  Result Date: 05/30/2019 CLINICAL DATA:  Check peritoneal dialysis catheter placement EXAM: ABDOMEN - 1 VIEW COMPARISON:  None. FINDINGS: Peritoneal dialysis catheter is noted in the deep pelvis. No acute bony or soft tissue abnormality is noted. Mild retained fecal material is noted consistent with a degree of constipation without obstructive change. IMPRESSION: Peritoneal dialysis catheter appears within normal limits. Changes of mild constipation. Electronically Signed   By: Inez Catalina M.D.   On: 05/30/2019 11:40    EKG: Not performed.   Assessment/Plan   1. Peritoneal catheter dysfunction; possible peritonitis; ESRD   - Presents with abdominal pain and inability to drain PD catheter  - There is no indication for  emergent dialysis; patient is afebrile and hemodynamically stable  - Nephrology is consulting and much appreciated, recommending blood cultures and empiric abx for peritonitis and they will plan for tunneled dialysis catheter in am  - Blood cultures and antibiotics ordered from ED, will continue and add prophylactic antifungal while on abx, follow cultures and clniical course   2. Insulin-dependent DM  - A1c was 9.9% in February  - Managed with [name] and Novolog at home, will use Lantus and Novolog while in hospital    3. Hypertension  - BP 180/90 range in ED  - She had been admitted to [facility] with hypotension in March, had dialysis parameters adjusted, and was started on midodrine  - Use labetalol IVP's as needed for now   4. Anemia  - Hgb is 10.2 on admission, similar to priors and with no bleeding    PPE: Mask, face shield. Patient wearing mask.  DVT prophylaxis: SCD's  Code Status: Full  Family Communication: Discussed with patient  Consults called: Nephrology  Admission status: Observation     Vianne Bulls, MD [facility] Pager [phone]  If 7PM-7AM, please contact night-coverage [email or web address] Password [email or web address]  05/31/2019, 3:08 AM

## 2019-05-31 NOTE — Progress Notes (Signed)
Pharmacy Antibiotic Note  Heather [name] is a 48 y.o. female admitted on 05/30/2019 with abdominal pain/peritonitis.  Pharmacy has been consulted for Vancomycin and Fortaz dosing.  Plan: Vancomycin 2 g IV now F/U plan for dialysis Fortaz 1 g IV q24h     Temp (24hrs), Avg:98.4 F (36.9 C), Min:98.4 F (36.9 C), Max:98.4 F (36.9 C)  Recent Labs  Lab 05/30/19 1816  WBC 9.1  CREATININE 8.13*    Estimated Creatinine Clearance: 10.8 mL/min (A) (by C-G formula based on SCr of 8.13 mg/dL (H)).    Allergies  Allergen Reactions  . Ibuprofen Other (See Comments)    CKD stage 3. Should avoid.  . Dilaudid [Hydromorphone Hcl] Itching and Other (See Comments)    Can take with Benadryl.  . Tramadol Itching    Caryl Pina 05/31/2019 3:08 AM

## 2019-05-31 NOTE — ED Provider Notes (Signed)
[facility] EMERGENCY DEPARTMENT Provider Note   CSN: [id number] Arrival date & time: 05/30/19  1749    History   Chief Complaint Chief Complaint  Patient presents with  . Abdominal Pain    HPI Heather [name] is a 48 y.o. female.     The history is provided by the patient.  Abdominal Pain  Pain location:  Generalized Pain quality: aching   Pain severity:  Moderate Onset quality:  Gradual Duration:  2 days Timing:  Constant Progression:  Worsening Chronicity:  New Relieved by:  Nothing Worsened by:  Movement and palpation Associated symptoms: no fever and no vomiting   Patient with history of ESRD on home peritoneal dialysis presents with abdominal pain and complications of her PD catheter.  She reports over the past 2 nights her dialysis catheter is not draining.  No fevers or vomiting.  She reports abdominal pain.  Past Medical History:  Diagnosis Date  . Antiplatelet or antithrombotic long-term use: Plavix 06/30/2017  . B12 deficiency 05/10/2014  . Blood transfusion without reported diagnosis   . Chronic constipation   . CVA (cerebral vascular accident) ([place]), nonhemorrhagic, inferior right cerebellum 12/03/2017  . Cyst of left ovary 01/12/2018  . Diabetic neuropathy, painful ([place]), on low dose Gabapentin 07/13/2013  . DM (diabetes mellitus), type 2, uncontrolled, with renal complications ([place]) 90/24/0973  . DM2 (diabetes mellitus, type 2) ([place])   . Dysfunction of left eustachian tube, with pusatile tinnitus 11/24/2017  . Dyslipidemia associated with type 2 diabetes mellitus ([place]) 06/25/2016  . ESRD (end stage renal disease) on dialysis [facility])    On hemodialysis in July 2019 via [facility] then switched to [facility] in Nov 2019.   [name] ESRD with anemia ([place])   . Fibromyalgia   . Gastroparesis due to DM   . GERD (gastroesophageal reflux disease)   . Hypertension associated with diabetes ([place]) 02/19/2011  . IBS (irritable bowel syndrome)   . Left-sided weakness  12/10/2017   .  [name] Leiomyoma of uterus   . Pancreatitis   . Proliferative diabetic retinopathy ([place]) 09/29/2015  . 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 Reactive depression 12/10/2017   .  [name] Right-sided low back pain without sciatica 12/08/2015  . Secondary hyperthyroidism 11/27/2013  . Steal syndrome dialysis vascular access ([place]) 02/17/2018  . Thromboembolism ([place]) 04/05/2018  . Upper airway cough syndrome, with recs to stay off ACE and take Pepcid q hs 11/15/2016    Patient Active Problem List   Diagnosis Date Noted  . ESRD on peritoneal dialysis ([place]) 04/25/2019  . History of peritonitis 03/04/2019  . Leucocytosis   . Neuropathic pain   . Renovascular hypertension   . Anemia of chronic disease   . Diabetes mellitus type 2 in obese ([place])   . ESRD on dialysis ([place])   . Bacterial peritonitis ([place])   . Debility 02/13/2019  . Peritonitis, dialysis-associated, initial encounter ([place]) 02/05/2019  . Peritonitis, dialysis-associated ([place]) 02/05/2019  . Left ovarian cyst 02/05/2019  . Hemiplegia and hemiparesis following cerebral infarction affecting left non-dominant side ([place]) 12/29/2018  . Peritoneal dialysis catheter in place [facility]) 11/14/2018  . Pre-transplant evaluation for kidney transplant 11/07/2018  . ESRD with anemia ([place])   . ESRD (end stage renal disease) on dialysis ([place])   . Grade I diastolic dysfunction 53/29/9242  . Arteriovenous fistula ([place]) 07/21/2018  . Type 2 diabetes mellitus with end-stage renal disease ([place]) 07/21/2018  . History of cerebrovascular accident (CVA) involving cerebellum 07/21/2018  .  Cyst of left ovary 01/12/2018  . Dysmenorrhea 01/12/2018  . Insomnia 01/12/2018  . Menorrhagia 01/12/2018  . Vitamin D deficiency 01/02/2018  . Bilateral lower extremity edema 01/02/2018  . Reactive depression 12/10/2017  . Arthralgia of right temporomandibular joint 10/24/2017  . Antiplatelet or antithrombotic long-term use: Plavix 06/30/2017  . Morbid  obesity due to excess calories ([place]) 11/16/2016  . Upper airway cough syndrome 11/15/2016  . DM (diabetes mellitus), type 2, uncontrolled, with renal complications ([place]) 96/75/9163  . Dyslipidemia associated with type 2 diabetes mellitus ([place]) 06/25/2016  . Uncontrolled type 2 diabetes mellitus with both eyes affected by proliferative retinopathy and macular edema, with long-term current use of insulin ([place]) 06/22/2016  . Uncontrolled type 2 diabetes mellitus with diabetic polyneuropathy, with long-term current use of insulin ([place]) 03/08/2016  . Proliferative diabetic retinopathy ([place]) 09/29/2015  . Constipation 09/03/2015  . Pseudophakia of both eyes 05/30/2015  . 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 B12 deficiency 05/10/2014  . Anemia associated with chronic renal failure, Procrit 04/26/2014  . CKD (chronic kidney disease) stage 5, GFR less than 15 ml/min (HCC) 04/26/2014  . Secondary hyperparathyroidism ([place]) 11/27/2013  . Posterior subcapsular cataract, bilateral 10/18/2013  . Diabetic neuropathy, painful ([place]), on low dose Gabapentin 07/13/2013  . Diabetes mellitus, type II, insulin dependent ([place]), on CGM, with end organ damage: renal, neuropathy, gastroparesis, retinopathy 02/19/2011  . Hypertension associated with diabetes ([place]) 02/19/2011  . Gastroparesis due to DM ([place]) 02/19/2011    Past Surgical History:  Procedure Laterality Date  . ARTERY REPAIR Right 02/17/2018   Procedure: EXPLORATION OF RIGHT BRACHIAL ARTERY;  Surgeon: Conrad Rutledge, MD;  Location: [place];  Service: Vascular;  Laterality: Right;  . ARTERY REPAIR Right 02/17/2018   Procedure: BRACHIAL ARTERY EXPLORATION AND TRHOMBECTOMY;  Surgeon: Conrad Charlack, MD;  Location[facility];  Service: Vascular;  Laterality: Right;  . AV FISTULA PLACEMENT Left 05/09/2017   Procedure: INSERTION OF ARTERIOVENOUS (AV) GRAFT ARM (ARTEGRAFT);  Surgeon: Conrad Argenta, MD;  Location: [facility] OR;  Service: Vascular;  Laterality: Left;  .  AV FISTULA PLACEMENT Right 02/17/2018   Procedure: INSERTION OF ARTERIOVENOUS (AV) GORE-TEX GRAFT ARM RIGHT UPPER ARM;  Surgeon: Conrad Womelsdorf, MD;  Location: [place];  Service: Vascular;  Laterality: Right;  . BASCILIC VEIN TRANSPOSITION Left 08/31/2016   Procedure: BASILIC VEIN TRANSPOSITION FIRST STAGE;  Surgeon: Conrad [place], MD;  Location: [place];  Service: Vascular;  Laterality: Left;  . CENTRAL VENOUS CATHETER INSERTION Left 07/24/2018   Procedure: INSERTION CENTRAL LINE ADULT;  Surgeon: Conrad [place] Lake, MD;  Location: [place];  Service: Vascular;  Laterality: Left;  . EYE SURGERY     secondary to diabetic retinopathy   . FOOT SURGERY Right    t"ook bone out- maybe hammer toe"  . INSERTION OF DIALYSIS CATHETER Left 07/24/2018   Procedure: INSERTION OF TUNNELED DIALYSIS CATHETER;  Surgeon: Conrad Superior, MD;  Location: [place];  Service: Vascular;  Laterality: Left;  . IR FLUORO GUIDE CV LINE RIGHT  07/21/2018  . IR US GUIDE VASC ACCESS RIGHT  07/21/2018  . LIGATION OF ARTERIOVENOUS  FISTULA Left 05/09/2017   Procedure: LIGATION OF ARTERIOVENOUS  FISTULA;  Surgeon: Conrad [place], MD;  Location: [place];  Service: Vascular;  Laterality: Left;  . LOOP RECORDER INSERTION N/A 12/05/2017   Procedure: LOOP RECORDER INSERTION;  Surgeon: Evans Lance, MD;  Location: [facility] [place] [facility] CV LAB;  Service: Cardiovascular;  Laterality: N/A;  .  MYOMECTOMY    . REMOVAL OF GRAFT Right 02/17/2018   Procedure: REMOVAL OF RIGHT UPPER ARM ARTERIOVENOUS GRAFT;  Surgeon: Conrad Utuado, MD;  Location: [place];  Service: Vascular;  Laterality: Right;  . REVISION OF ARTERIOVENOUS GORETEX GRAFT Left 07/24/2018   Procedure: REDO ARTERIOVENOUS GORETEX GRAFT;  Surgeon: Conrad [place], MD;  Location: [place];  Service: Vascular;  Laterality: Left;  . TEE WITHOUT CARDIOVERSION N/A 12/05/2017   Procedure: TRANSESOPHAGEAL ECHOCARDIOGRAM (TEE);  Surgeon: Sanda Klein, MD;  Location: [facility] ENDOSCOPY;  Service: Cardiovascular;  Laterality: N/A;  .  UPPER EXTREMITY VENOGRAPHY Left 07/13/2018   Procedure: UPPER EXTREMITY VENOGRAPHY - Central & Left Arm;  Surgeon: Conrad Taylorsville, MD;  Location: [place] CV LAB;  Service: Cardiovascular;  Laterality: Left;  . UTERINE FIBROID SURGERY    . VITRECTOMY Bilateral      OB History    Gravida  2   Para  1   Term      Preterm      AB  1   Living  1     SAB  1   TAB      Ectopic      Multiple      Live Births               Home Medications    Prior to Admission medications   Medication Sig Start Date End Date Taking? Authorizing Provider  acetaminophen (TYLENOL) 325 MG tablet Take 1-2 tablets (325-650 mg total) by mouth every 4 (four) hours as needed for mild pain. 02/23/19   Love, Ivan Anchors, PA-C  blood glucose meter kit and supplies KIT Dispense based on patient and insurance preference. Use up to four times daily as directed. (FOR ICD-9 250.00, 250.01). 04/25/19   Briscoe Deutscher, DO  Continuous Blood Gluc Sensor (FREESTYLE LIBRE 14 DAY SENSOR) MISC 1 patch by Does not apply route every 14 (fourteen) days. 04/11/19   Briscoe Deutscher, DO  cyclobenzaprine (FLEXERIL) 5 MG tablet 1/2 tab bid for 10 days 05/28/19   Briscoe Deutscher, DO  gabapentin (NEURONTIN) 100 MG capsule Take 1 capsule (100 mg total) by mouth daily. 04/11/19   Briscoe Deutscher, DO  gentamicin cream (GARAMYCIN) 0.1 % Apply 1 application topically daily. 02/23/19   Love, Ivan Anchors, PA-C  insulin aspart (NOVOLOG FLEXPEN) 100 UNIT/ML FlexPen Inject into the skin.    [provider]  insulin degludec (TRESIBA FLEXTOUCH) 100 UNIT/ML SOPN FlexTouch Pen Inject into the skin. 03/20/19   [provider]  lanthanum (FOSRENOL) 1000 MG chewable tablet Chew 1 tablet (1,000 mg total) by mouth 3 (three) times daily with meals. 02/23/19   Love, Ivan Anchors, PA-C  lubiprostone (AMITIZA) 24 MCG capsule Take 1 capsule (24 mcg total) by mouth 2 (two) times daily with a meal. 02/23/19   Love, Ivan Anchors, PA-C  midodrine (PROAMATINE)  5 MG tablet Take 1 tablet (5 mg total) by mouth 2 (two) times daily with a meal. 04/11/19   Briscoe Deutscher, DO  multivitamin (RENA-VIT) TABS tablet Take 1 tablet by mouth at bedtime. 02/23/19   Love, Ivan Anchors, PA-C  potassium chloride (K-DUR) 10 MEQ tablet TAKE 4 TABLETS BY MOUTH EVERY DAY 05/19/19   Briscoe Deutscher, DO  simvastatin (ZOCOR) 10 MG tablet Take 0.5 tablets (5 mg total) by mouth at bedtime. 1/2 po q hs - GFR 14 04/10/19   Briscoe Deutscher, DO    Family History Family History  Problem Relation Age of Onset  .  Colon polyps Mother   . Diabetes Mother   . Heart murmur Father        history essentially unknown  . Hypertension Father   . Diabetes Sister   . Kidney failure Sister        kidney transplant  . Diabetes Brother   . Retinal degeneration Brother   . Heart murmur Son   . Stroke Paternal Grandmother   . Diabetes Sister     Social History Social History   Tobacco Use  . Smoking status: Never Smoker  . Smokeless tobacco: Never Used  Substance Use Topics  . Alcohol use: No    Alcohol/week: 0.0 standard drinks  . Drug use: No     Allergies   Ibuprofen; Dilaudid [hydromorphone hcl]; and Tramadol   Review of Systems Review of Systems  Constitutional: Negative for fever.  Gastrointestinal: Positive for abdominal pain. Negative for vomiting.  All other systems reviewed and are negative.    Physical Exam Updated Vital Signs BP (!) 176/82 (BP Location: Right Arm)   Pulse (!) 105   Temp 98.4 F (36.9 C) (Oral)   Resp 14   SpO2 100%   Physical Exam CONSTITUTIONAL: Well developed/well nourished HEAD: Normocephalic/atraumatic EYES: EOMI ENMT: mask in place NECK: supple no meningeal signs CV: S1/S2 noted, no murmurs/rubs/gallops noted LUNGS: Lungs are clear to auscultation bilaterally, no apparent distress ABDOMEN: soft, mild tenderness to left lower quadrant, no rebound or guarding, bowel sounds noted throughout abdomen, PD catheter noted to the left lower  abdomen, no tenderness or redness GU:no cva tenderness NEURO: Pt is awake/alert/appropriate, moves all extremitiesx4.  No facial droop.   EXTREMITIES: pulses normal/equal, full ROM SKIN: warm, color normal PSYCH: no abnormalities of mood noted, alert and oriented to situation   ED Treatments / Results  Labs (all labs ordered are listed, but only abnormal results are displayed) Labs Reviewed  LIPASE, BLOOD - Abnormal; Notable for the following components:      Result Value   Lipase 65 (*)    All other components within normal limits  COMPREHENSIVE METABOLIC PANEL - Abnormal; Notable for the following components:   Sodium 133 (*)    Chloride 96 (*)    CO2 21 (*)    Glucose, Bld 393 (*)    BUN 37 (*)    Creatinine, Ser 8.13 (*)    Calcium 7.6 (*)    Albumin 2.6 (*)    AST 14 (*)    Alkaline Phosphatase 140 (*)    GFR calc non Af Amer 5 (*)    GFR calc Af Amer 6 (*)    Anion gap 16 (*)    All other components within normal limits  CBC - Abnormal; Notable for the following components:   RBC 3.48 (*)    Hemoglobin 10.2 (*)    HCT 34.8 (*)    MCHC 29.3 (*)    All other components within normal limits  URINALYSIS, ROUTINE W REFLEX MICROSCOPIC - Abnormal; Notable for the following components:   APPearance CLOUDY (*)    Glucose, UA >=500 (*)    Hgb urine dipstick SMALL (*)    Protein, ur >=300 (*)    Leukocytes,Ua TRACE (*)    Bacteria, UA RARE (*)    All other components within normal limits  SARS CORONAVIRUS 2 (HOSPITAL ORDER, [place] LAB)  CULTURE, BLOOD (ROUTINE X 2)  CULTURE, BLOOD (ROUTINE X 2)  I-STAT BETA HCG BLOOD, ED ([facility], [facility], [facility] ONLY)  EKG None  Radiology Dg Abd 1 View  Result Date: 05/30/2019 CLINICAL DATA:  Check peritoneal dialysis catheter placement EXAM: ABDOMEN - 1 VIEW COMPARISON:  None. FINDINGS: Peritoneal dialysis catheter is noted in the deep pelvis. No acute bony or soft tissue abnormality is noted. Mild retained fecal  material is noted consistent with a degree of constipation without obstructive change. IMPRESSION: Peritoneal dialysis catheter appears within normal limits. Changes of mild constipation. Electronically Signed   By: Inez Catalina M.D.   On: 05/30/2019 11:40    Procedures Procedures   Medications Ordered in ED Medications  sodium chloride flush (NS) 0.9 % injection 3 mL (has no administration in time range)  fentaNYL (SUBLIMAZE) injection 100 mcg (has no administration in time range)  cefTAZidime (FORTAZ) 2 g in sodium chloride 0.9 % 100 mL IVPB (has no administration in time range)  vancomycin (VANCOCIN) IVPB 1000 mg/200 mL premix (has no administration in time range)     Initial Impression / Assessment and Plan / ED Course  I have reviewed the triage vital signs and the nursing notes.  Pertinent labs results that were available during my care of the patient were reviewed by me and considered in my medical decision making (see chart for details).        Patient presents with malfunctioning peritoneal dialysis catheter.  She has localized abdominal tenderness.  Discussed with nephrology Dr. Augustin Coupe.  Request admission, blood cultures, Fortaz/vancomycin.  Plan for tunneled catheter placement tomorrow.  Discussed with Dr. Myna Hidalgo for admission.  Final Clinical Impressions(s) / ED Diagnoses   Final diagnoses:  Peritonitis ([place])  Complication of intraperitoneal dialysis catheter, unspecified complication, initial encounter    ED Discharge Orders    None       Ripley Fraise, MD 05/31/19 [phone]

## 2019-05-31 NOTE — ED Notes (Addendum)
The pt is a peritoneal dialysis pt that filled last pm at 2200  No drainage since then  Some lower abd pain no temp  Alert skin warm and dry

## 2019-05-31 NOTE — Progress Notes (Addendum)
Nephrologist has put in orders for patient to receive dialysis later today. Will hold hydralazine for now as B/P is now 119/70. Will administer PRN fentanyl as patient rates pain 10/10. Will continue to monitor.

## 2019-05-31 NOTE — ED Notes (Signed)
Waiting for iv team

## 2019-05-31 NOTE — Consult Note (Signed)
Reason for Consult: ESRD Referring Physician:  Dr. Myna Hidalgo  Chief Complaint:  Abdominal pain  Assessment/Plan: 1. ESRD - will transition to HD - Will request removal of HD catheter by general surgery. - VIR for catheter insertion in the AM; PT/PTT requested already. - Has had a few failed dialysis accesses; will plug back in system w/ VVS to determine next permanent access. - We will try to flush the peritoneal catheter and hopefully get some return to send for gm stain, cell count and cultures. - Initiate HD once the catheter is in. 2. Renal osteodystrophy - req a phos for this AM already to help w/ management 3. Anemia - at goal; she received Mircera yesterday. 4. Peritonitis - would like to confirm w/ studies but she's also already received therapy. 5. HTN - Midodrine stopped and on hydralazine for now; monitor response to therapy especially w/ UF.    HPI: Heather [name] is an 48 y.o. female h/o CVA, DM, HTN, ESRD previously HD but most recently on PD since mid 11/2019 presenting with abdominal pain and inability to drain her PD catheter. She has been followed by Dr. Moshe Cipro and Dr. Jimmy Footman in the past. She denies f/c/n/v but has had some constipation. She also denies cough, sore throat, myalgias. She does not want to be on PD anymore and is here also to be transitioned to HD. KUB in the ED shows mild constipation; she was started on empiric antibiotics for possible peritonitis. She states that this feels the same way as when she had peritonitis 01/2019.   ROS Pertinent items are noted in HPI.  Chemistry and CBC: Creatinine  Date/Time Value Ref Range Status  03/20/2019 8.1 (A) 0.5 - 1.1 Final  05/09/2018 5.2 (A) 0.5 - 1.1 Final  02/01/2018 4.4 (A) 0.5 - 1.1 Final  04/19/2017 3.8 (A) 0.5 - 1.1 mg/dL Final   Creat  Date/Time Value Ref Range Status  09/03/2015 08:55 AM 3.05 (H) 0.50 - 1.10 mg/dL Final  10/18/2014 02:50 PM 2.48 (H) 0.50 - 1.10 mg/dL Final  10/16/2013 2.25   Final  08/29/2013 1.94  Final  08/01/2013 1.72  Final  03/19/2013 57.6  Final  03/19/2013 2.11  Final  02/27/2013 237.4  Final  01/03/2013 1.61  Final  09/27/2012 2.06  Final  08/14/2012 1.67  Final   Creatinine, Ser  Date/Time Value Ref Range Status  05/30/2019 06:16 PM 8.13 (H) 0.44 - 1.00 mg/dL Final  02/21/2019 05:23 AM 11.11 (H) 0.44 - 1.00 mg/dL Final  02/15/2019 01:23 PM 9.45 (H) 0.44 - 1.00 mg/dL Final  02/13/2019 10:38 AM 7.93 (H) 0.44 - 1.00 mg/dL Final  02/11/2019 08:54 AM 7.59 (H) 0.44 - 1.00 mg/dL Final  02/10/2019 06:46 AM 7.66 (H) 0.44 - 1.00 mg/dL Final  02/07/2019 06:25 AM 9.58 (H) 0.44 - 1.00 mg/dL Final  02/06/2019 05:38 AM 10.38 (H) 0.44 - 1.00 mg/dL Final  02/05/2019 04:28 PM 10.68 (H) 0.44 - 1.00 mg/dL Final  02/05/2019 09:32 AM 11.03 (H) 0.44 - 1.00 mg/dL Final  02/03/2019 08:15 PM 10.10 (H) 0.44 - 1.00 mg/dL Final  07/24/2018 07:46 AM 6.39 (H) 0.44 - 1.00 mg/dL Final  07/24/2018 03:03 AM 6.37 (H) 0.44 - 1.00 mg/dL Final  07/22/2018 03:09 AM 5.60 (H) 0.44 - 1.00 mg/dL Final  07/21/2018 09:42 AM 6.27 (H) 0.44 - 1.00 mg/dL Final  07/21/2018 09:42 AM 6.13 (H) 0.44 - 1.00 mg/dL Final  07/21/2018 07:11 AM 6.70 (H) 0.44 - 1.00 mg/dL Final  07/13/2018 06:35 AM 7.10 (H) 0.44 -  1.00 mg/dL Final  07/11/2018 06:21 PM 6.22 (H) 0.44 - 1.00 mg/dL Final  06/05/2018 08:50 AM 5.77 (HH) 0.40 - 1.20 mg/dL Final  04/11/2018 02:21 PM 4.32 (H) 0.40 - 1.20 mg/dL Final  03/22/2018 01:51 PM 4.63 (HH) 0.40 - 1.20 mg/dL Final  02/19/2018 06:47 PM 6.06 (H) 0.44 - 1.00 mg/dL Final  02/17/2018 11:38 PM 4.65 (H) 0.44 - 1.00 mg/dL Final  01/02/2018 08:25 AM 4.09 (H) 0.40 - 1.20 mg/dL Final  12/02/2017 01:30 PM 4.93 (H) 0.44 - 1.00 mg/dL Final  02/28/2017 09:58 PM 3.30 (H) 0.44 - 1.00 mg/dL Final  02/17/2017 05:16 PM 3.21 (H) 0.44 - 1.00 mg/dL Final  11/25/2016 11:39 AM 3.44 (H) 0.40 - 1.20 mg/dL Final  10/11/2016 04:45 AM 3.01 (H) 0.44 - 1.00 mg/dL Final  10/10/2016 03:30 PM 3.12 (H)  0.44 - 1.00 mg/dL Final  09/12/2015 10:51 AM 4.36 (H) 0.44 - 1.00 mg/dL Final  09/07/2015 11:11 AM 3.22 (H) 0.44 - 1.00 mg/dL Final  08/11/2015 11:34 AM 3.22 (H) 0.44 - 1.00 mg/dL Final  06/23/2015 04:25 PM 3.73 (H) 0.44 - 1.00 mg/dL Final  06/23/2015 01:05 PM 4.13 (H) 0.44 - 1.00 mg/dL Final  04/17/2015 08:10 PM 3.73 (H) 0.50 - 1.10 mg/dL Final  04/17/2015 11:41 AM 3.58 (H) 0.40 - 1.20 mg/dL Final  01/08/2015 04:30 PM 3.14 (H) 0.40 - 1.20 mg/dL Final  09/19/2014 10:17 AM 3.1 (H) 0.4 - 1.2 mg/dL Final  09/11/2014 09:36 AM 2.9 (H) 0.4 - 1.2 mg/dL Final  07/30/2014 02:41 AM 2.81 (H) 0.50 - 1.10 mg/dL Final  07/29/2014 09:56 PM 3.00 (H) 0.50 - 1.10 mg/dL Final  07/29/2014 09:44 PM 2.84 (H) 0.50 - 1.10 mg/dL Final  04/23/2014 04:24 AM 3.48 (H) 0.50 - 1.10 mg/dL Final  04/22/2014 08:06 AM 3.13 (H) 0.50 - 1.10 mg/dL Final  04/21/2014 05:00 AM 3.11 (H) 0.50 - 1.10 mg/dL Final  04/20/2014 05:23 AM 3.42 (H) 0.50 - 1.10 mg/dL Final  04/19/2014 04:59 AM 3.19 (H) 0.50 - 1.10 mg/dL Final  04/19/2014 04:39 AM 3.31 (H) 0.50 - 1.10 mg/dL Final  04/18/2014 10:29 PM 2.97 (H) 0.50 - 1.10 mg/dL Final  04/18/2014 12:09 PM 3.14 (H) 0.50 - 1.10 mg/dL Final   Recent Labs  Lab 05/30/19 1816  NA 133*  K 3.7  CL 96*  CO2 21*  GLUCOSE 393*  BUN 37*  CREATININE 8.13*  CALCIUM 7.6*   Recent Labs  Lab 05/30/19 1816  WBC 9.1  HGB 10.2*  HCT 34.8*  MCV 100.0  PLT 322   Liver Function Tests: Recent Labs  Lab 05/30/19 1816  AST 14*  ALT 14  ALKPHOS 140*  BILITOT 0.8  PROT 7.7  ALBUMIN 2.6*   Recent Labs  Lab 05/30/19 1816  LIPASE 65*   No results for input(s): AMMONIA in the last 168 hours. Cardiac Enzymes: No results for input(s): CKTOTAL, CKMB, CKMBINDEX, TROPONINI in the last 168 hours. Iron Studies: No results for input(s): IRON, TIBC, TRANSFERRIN, FERRITIN in the last 72 hours. PT/INR: _0 (inr:5)  Xrays/Other Studies: ) Results for orders placed or performed during the  hospital encounter of 05/30/19 (from the past 48 hour(s))  Lipase, blood     Status: Abnormal   Collection Time: 05/30/19  6:16 PM  Result Value Ref Range   Lipase 65 (H) 11 - 51 U/L    Comment: Performed at [facility] [address], [place], [place] [address]  Comprehensive metabolic panel     Status: Abnormal   Collection  Time: 05/30/19  6:16 PM  Result Value Ref Range   Sodium 133 (L) 135 - 145 mmol/L   Potassium 3.7 3.5 - 5.1 mmol/L   Chloride 96 (L) 98 - 111 mmol/L   CO2 21 (L) 22 - 32 mmol/L   Glucose, Bld 393 (H) 70 - 99 mg/dL   BUN 37 (H) 6 - 20 mg/dL   Creatinine, Ser 8.13 (H) 0.44 - 1.00 mg/dL   Calcium 7.6 (L) 8.9 - 10.3 mg/dL   Total Protein 7.7 6.5 - 8.1 g/dL   Albumin 2.6 (L) 3.5 - 5.0 g/dL   AST 14 (L) 15 - 41 U/L   ALT 14 0 - 44 U/L   Alkaline Phosphatase 140 (H) 38 - 126 U/L   Total Bilirubin 0.8 0.3 - 1.2 mg/dL   GFR calc non Af Amer 5 (L) >60 mL/min   GFR calc Af Amer 6 (L) >60 mL/min   Anion gap 16 (H) 5 - 15    Comment: Performed at [facility], [address] [place], [place] [address]  CBC     Status: Abnormal   Collection Time: 05/30/19  6:16 PM  Result Value Ref Range   WBC 9.1 4.0 - 10.5 K/uL   RBC 3.48 (L) 3.87 - 5.11 MIL/uL   Hemoglobin 10.2 (L) 12.0 - 15.0 g/dL   HCT 34.8 (L) 36.0 - 46.0 %   MCV 100.0 80.0 - 100.0 fL   MCH 29.3 26.0 - 34.0 pg   MCHC 29.3 (L) 30.0 - 36.0 g/dL   RDW 12.6 11.5 - 15.5 %   Platelets 322 150 - 400 K/uL   nRBC 0.0 0.0 - 0.2 %    Comment: Performed at [facility], [place] [address], [place], [place] [address]  I-Stat beta hCG blood, ED     Status: None   Collection Time: 05/30/19  6:35 PM  Result Value Ref Range   I-stat hCG, quantitative <5.0 <5 mIU/mL   Comment 3            Comment:   GEST. AGE      CONC.  (mIU/mL)   <=1 WEEK        5 - 50     2 WEEKS       50 - 500     3 WEEKS       100 - 10,000     4 WEEKS     1,000 - 30,000        FEMALE AND NON-PREGNANT FEMALE:     LESS THAN 5  mIU/mL   Urinalysis, Routine w reflex microscopic     Status: Abnormal   Collection Time: 05/31/19 12:41 AM  Result Value Ref Range   Color, Urine YELLOW YELLOW   APPearance CLOUDY (A) CLEAR   Specific Gravity, Urine 1.013 1.005 - 1.030   pH 5.0 5.0 - 8.0   Glucose, UA >=500 (A) NEGATIVE mg/dL   Hgb urine dipstick SMALL (A) NEGATIVE   Bilirubin Urine NEGATIVE NEGATIVE   Ketones, ur NEGATIVE NEGATIVE mg/dL   Protein, ur >=300 (A) NEGATIVE mg/dL   Nitrite NEGATIVE NEGATIVE   Leukocytes,Ua TRACE (A) NEGATIVE   RBC / HPF 0-5 0 - 5 RBC/hpf   WBC, UA 0-5 0 - 5 WBC/hpf   Bacteria, UA RARE (A) NONE SEEN   Squamous Epithelial / LPF 0-5 0 - 5   Mucus PRESENT    Hyaline Casts, UA PRESENT     Comment: Performed at  [facility], [place] [address] [place], [place] [address]  SARS Coronavirus 2 (CEPHEID - Performed in [place] hospital lab), Hosp Order     Status: None   Collection Time: 05/31/19  1:36 AM  Result Value Ref Range   SARS Coronavirus 2 NEGATIVE NEGATIVE    Comment: (NOTE) If result is NEGATIVE SARS-CoV-2 target nucleic acids are NOT DETECTED. The SARS-CoV-2 RNA is generally detectable in upper and lower  respiratory specimens during the acute phase of infection. The lowest  concentration of SARS-CoV-2 viral copies this assay can detect is 250  copies / mL. A negative result does not preclude SARS-CoV-2 infection  and should not be used as the sole basis for treatment or other  patient management decisions.  A negative result may occur with  improper specimen collection / handling, submission of specimen other  than nasopharyngeal swab, presence of viral mutation(s) within the  areas targeted by this assay, and inadequate number of viral copies  (<250 copies / mL). A negative result must be combined with clinical  observations, patient history, and epidemiological information. If result is POSITIVE SARS-CoV-2 target nucleic acids are DETECTED. The SARS-CoV-2 RNA is  generally detectable in upper and lower  respiratory specimens dur ing the acute phase of infection.  Positive  results are indicative of active infection with SARS-CoV-2.  Clinical  correlation with patient history and other diagnostic information is  necessary to determine patient infection status.  Positive results do  not rule out bacterial infection or co-infection with other viruses. If result is PRESUMPTIVE POSTIVE SARS-CoV-2 nucleic acids MAY BE PRESENT.   A presumptive positive result was obtained on the submitted specimen  and confirmed on repeat testing.  While 2019 novel coronavirus  (SARS-CoV-2) nucleic acids may be present in the submitted sample  additional confirmatory testing may be necessary for epidemiological  and / or clinical management purposes  to differentiate between  SARS-CoV-2 and other Sarbecovirus currently known to infect humans.  If clinically indicated additional testing with an alternate test  methodology [phone]) is advised. The SARS-CoV-2 RNA is generally  detectable in upper and lower respiratory sp ecimens during the acute  phase of infection. The expected result is Negative. Fact Sheet for Patients:  StrictlyIdeas.no Fact Sheet for Healthcare Providers: BankingDealers.co.za This test is not yet approved or cleared by the [place] FDA and has been authorized for detection and/or diagnosis of SARS-CoV-2 by FDA under an Emergency Use Authorization (EUA).  This EUA will remain in effect (meaning this test can be used) for the duration of the COVID-19 declaration under Section 564(b)(1) of the Act, 21 U.S.C. section 360bbb-3(b)(1), unless the authorization is terminated or revoked sooner. Performed at [facility], [place] [address], [place], [place] [address]   CBG monitoring, ED     Status: Abnormal   Collection Time: 05/31/19  3:42 AM  Result Value Ref Range   Glucose-Capillary 310 (H) 70  - 99 mg/dL   Comment 1 Notify RN    Comment 2 Document in Chart    Dg Abd 1 View  Result Date: 05/30/2019 CLINICAL DATA:  Check peritoneal dialysis catheter placement EXAM: ABDOMEN - 1 VIEW COMPARISON:  None. FINDINGS: Peritoneal dialysis catheter is noted in the deep pelvis. No acute bony or soft tissue abnormality is noted. Mild retained fecal material is noted consistent with a degree of constipation without obstructive change. IMPRESSION: Peritoneal dialysis catheter appears within normal limits. Changes of mild constipation. Electronically Signed  By: Inez Catalina M.D.   On: 05/30/2019 11:40    PMH:   Past Medical History:  Diagnosis Date  . Antiplatelet or antithrombotic long-term use: Plavix 06/30/2017  . B12 deficiency 05/10/2014  . Blood transfusion without reported diagnosis   . Chronic constipation   . 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 Cyst of left ovary 01/12/2018  . Diabetic neuropathy, painful ([place]), on low dose Gabapentin 07/13/2013  . DM (diabetes mellitus), type 2, uncontrolled, with renal complications ([place]) [id number]  . DM2 (diabetes mellitus, type 2) ([place])   . Dysfunction of left eustachian tube, with pusatile tinnitus 11/24/2017  . Dyslipidemia associated with type 2 diabetes mellitus ([place]) 06/25/2016  . ESRD (end stage renal disease) on dialysis [facility])    On hemodialysis in July 2019 via [facility] then switched to [facility] in Nov 2019.   [name] ESRD with anemia ([place])   . Fibromyalgia   . Gastroparesis due to DM   . GERD (gastroesophageal reflux disease)   . Hypertension associated with diabetes ([place]) 02/19/2011  . IBS (irritable bowel syndrome)   . Left-sided weakness 12/10/2017   .  [name] Leiomyoma of uterus   . Pancreatitis   . Proliferative diabetic retinopathy ([place]) 09/29/2015  . 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 Reactive depression 12/10/2017   .  [name] Right-sided low back pain without sciatica 12/08/2015   . Secondary hyperthyroidism 11/27/2013  . 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 Thromboembolism ([place]) 04/05/2018  . Upper airway cough syndrome, with recs to stay off ACE and take Pepcid q hs 11/15/2016    PSH:   Past Surgical History:  Procedure Laterality Date  . ARTERY REPAIR Right 02/17/2018   Procedure: EXPLORATION OF RIGHT BRACHIAL ARTERY;  Surgeon: Conrad Two Strike, MD;  Location: [place];  Service: Vascular;  Laterality: Right;  . ARTERY REPAIR Right 02/17/2018   Procedure: BRACHIAL ARTERY EXPLORATION AND TRHOMBECTOMY;  Surgeon: Conrad [place] Hills, MD;  [facility] OR;  Service: Vascular;  Laterality: Right;  . AV FISTULA PLACEMENT Left 05/09/2017   Procedure: INSERTION OF ARTERIOVENOUS (AV) GRAFT ARM (ARTEGRAFT);  Surgeon: Conrad Parshall, MD;  Location: [facility] OR;  Service: Vascular;  Laterality: Left;  . AV FISTULA PLACEMENT Right 02/17/2018   Procedure: INSERTION OF ARTERIOVENOUS (AV) GORE-TEX GRAFT ARM RIGHT UPPER ARM;  Surgeon: Conrad Hillsboro, MD;  Location: [place];  Service: Vascular;  Laterality: Right;  . BASCILIC VEIN TRANSPOSITION Left 08/31/2016   Procedure: BASILIC VEIN TRANSPOSITION FIRST STAGE;  Surgeon: Conrad Pettibone, MD;  Location: [place];  Service: Vascular;  Laterality: Left;  . CENTRAL VENOUS CATHETER INSERTION Left 07/24/2018   Procedure: INSERTION CENTRAL LINE ADULT;  Surgeon: Conrad [place]town, MD;  Location: [facility];  Service: Vascular;  Laterality: Left;  . EYE SURGERY     secondary to diabetic retinopathy   . FOOT SURGERY Right    t"ook bone out- maybe hammer toe"  . INSERTION OF DIALYSIS CATHETER Left 07/24/2018   Procedure: INSERTION OF TUNNELED DIALYSIS CATHETER;  Surgeon: Conrad Akiak, MD;  Location: [facility];  Service: Vascular;  Laterality: Left;  . IR FLUORO GUIDE CV LINE RIGHT  07/21/2018  . IR US GUIDE VASC ACCESS RIGHT  07/21/2018  . LIGATION OF ARTERIOVENOUS  FISTULA Left 05/09/2017   Procedure: LIGATION OF ARTERIOVENOUS  FISTULA;  Surgeon: Conrad Fabens,  MD;  Location: [place];  Service: Vascular;  Laterality: Left;  . LOOP RECORDER  INSERTION N/A 12/05/2017   Procedure: LOOP RECORDER INSERTION;  Surgeon: Evans Lance, MD;  Location: [place] [facility] LAB;  Service: Cardiovascular;  Laterality: N/A;  . MYOMECTOMY    . REMOVAL OF GRAFT Right 02/17/2018   Procedure: REMOVAL OF RIGHT UPPER ARM ARTERIOVENOUS GRAFT;  Surgeon: Conrad Altamont, MD;  Location: [facility];  Service: Vascular;  Laterality: Right;  . REVISION OF ARTERIOVENOUS GORETEX GRAFT Left 07/24/2018   Procedure: REDO ARTERIOVENOUS GORETEX GRAFT;  Surgeon: Conrad [place] Park, MD;  Location: [place];  Service: Vascular;  Laterality: Left;  . TEE WITHOUT CARDIOVERSION N/A 12/05/2017   Procedure: TRANSESOPHAGEAL ECHOCARDIOGRAM (TEE);  Surgeon: Sanda Klein, MD;  Location[facility] ENDOSCOPY;  Service: Cardiovascular;  Laterality: N/A;  . UPPER EXTREMITY VENOGRAPHY Left 07/13/2018   Procedure: UPPER EXTREMITY VENOGRAPHY - Central & Left Arm;  Surgeon: Conrad Du Quoin, MD;  Location: [place] [facility];  Service: Cardiovascular;  Laterality: Left;  . UTERINE FIBROID SURGERY    . VITRECTOMY Bilateral     Allergies:  Allergies  Allergen Reactions  . Ibuprofen Other (See Comments)    CKD stage 3. Should avoid.  . Dilaudid [Hydromorphone Hcl] Itching and Other (See Comments)    Can take with Benadryl.  . Tramadol Itching    Medications:   Prior to Admission medications   Medication Sig Start Date End Date Taking? Authorizing Provider  acetaminophen (TYLENOL) 325 MG tablet Take 1-2 tablets (325-650 mg total) by mouth every 4 (four) hours as needed for mild pain. 02/23/19   Love, Ivan Anchors, PA-C  blood glucose meter kit and supplies KIT Dispense based on patient and insurance preference. Use up to four times daily as directed. (FOR ICD-9 250.00, 250.01). 04/25/19   Briscoe Deutscher, DO  Continuous Blood Gluc Sensor (FREESTYLE LIBRE 14 DAY SENSOR) MISC 1 patch by Does not apply route every 14 (fourteen) days.  04/11/19   Briscoe Deutscher, DO  cyclobenzaprine (FLEXERIL) 5 MG tablet 1/2 tab bid for 10 days 05/28/19   Briscoe Deutscher, DO  gabapentin (NEURONTIN) 100 MG capsule Take 1 capsule (100 mg total) by mouth daily. 04/11/19   Briscoe Deutscher, DO  gentamicin cream (GARAMYCIN) 0.1 % Apply 1 application topically daily. 02/23/19   Love, Ivan Anchors, PA-C  insulin aspart (NOVOLOG FLEXPEN) 100 UNIT/ML FlexPen Inject into the skin.    [provider]  insulin degludec (TRESIBA FLEXTOUCH) 100 UNIT/ML SOPN FlexTouch Pen Inject into the skin. 03/20/19   [provider]  lanthanum (FOSRENOL) 1000 MG chewable tablet Chew 1 tablet (1,000 mg total) by mouth 3 (three) times daily with meals. 02/23/19   Love, Ivan Anchors, PA-C  lubiprostone (AMITIZA) 24 MCG capsule Take 1 capsule (24 mcg total) by mouth 2 (two) times daily with a meal. 02/23/19   Love, Ivan Anchors, PA-C  midodrine (PROAMATINE) 5 MG tablet Take 1 tablet (5 mg total) by mouth 2 (two) times daily with a meal. 04/11/19   Briscoe Deutscher, DO  multivitamin (RENA-VIT) TABS tablet Take 1 tablet by mouth at bedtime. 02/23/19   Love, Ivan Anchors, PA-C  potassium chloride (K-DUR) 10 MEQ tablet TAKE 4 TABLETS BY MOUTH EVERY DAY 05/19/19   Briscoe Deutscher, DO  simvastatin (ZOCOR) 10 MG tablet Take 0.5 tablets (5 mg total) by mouth at bedtime. 1/2 po q hs - GFR 14 04/10/19   Briscoe Deutscher, DO    Discontinued Meds:   Medications Discontinued During This Encounter  Medication Reason  . vancomycin (VANCOCIN) IVPB 1000 mg/200 mL premix   .  fentaNYL (SUBLIMAZE) injection 25-50 mcg   . fentaNYL (SUBLIMAZE) injection 50-75 mcg     Social History:  reports that she has never smoked. She has never used smokeless tobacco. She reports that she does not drink alcohol or use drugs.  Family History:   Family History  Problem Relation Age of Onset  . Colon polyps Mother   . Diabetes Mother   . Heart murmur Father        history essentially unknown  . Hypertension Father   .  Diabetes Sister   . Kidney failure Sister        kidney transplant  . Diabetes Brother   . Retinal degeneration Brother   . Heart murmur Son   . Stroke Paternal Grandmother   . Diabetes Sister     Blood pressure (!) 178/80, pulse 92, temperature 98.6 F (37 C), temperature source Oral, resp. rate 17, height _0  (1.753 m), weight 109.9 kg, SpO2 98 %. General appearance: alert, cooperative and appears stated age Head: Normocephalic, without obvious abnormality, atraumatic Eyes: negative Neck: no adenopathy, no carotid bruit, supple, symmetrical, trachea midline and thyroid not enlarged, symmetric, no tenderness/mass/nodules Back: symmetric, no curvature. ROM normal. No CVA tenderness. Resp: clear to auscultation bilaterally Chest wall: no tenderness Cardio: regular rate and rhythm, S1, S2 normal, no murmur, click, rub or gallop GI: Ascities, tense, tender Extremities: edema 1+ Pulses: 2+ and symmetric Skin: Skin color, texture, turgor normal. No rashes or lesions Lymph nodes: Cervical, supraclavicular, and axillary nodes normal. Neurologic: Grossly normal       Marcena Dias, Hunt Oris, MD 05/31/2019, 5:17 AM

## 2019-06-01 ENCOUNTER — Inpatient Hospital Stay (HOSPITAL_COMMUNITY): Payer: Managed Care, Other (non HMO)

## 2019-06-01 ENCOUNTER — Encounter (HOSPITAL_COMMUNITY): Payer: Self-pay | Admitting: Interventional Radiology

## 2019-06-01 DIAGNOSIS — K659 Peritonitis, unspecified: Secondary | ICD-10-CM

## 2019-06-01 HISTORY — PX: IR US GUIDE VASC ACCESS RIGHT: IMG2390

## 2019-06-01 HISTORY — PX: IR FLUORO GUIDE CV LINE RIGHT: IMG2283

## 2019-06-01 LAB — CBC
HCT: 27.2 % — ABNORMAL LOW (ref 36.0–46.0)
Hemoglobin: 8.3 g/dL — ABNORMAL LOW (ref 12.0–15.0)
MCH: 29 pg (ref 26.0–34.0)
MCHC: 30.5 g/dL (ref 30.0–36.0)
MCV: 95.1 fL (ref 80.0–100.0)
Platelets: 302 10*3/uL (ref 150–400)
RBC: 2.86 MIL/uL — ABNORMAL LOW (ref 3.87–5.11)
RDW: 12.1 % (ref 11.5–15.5)
WBC: 7.9 10*3/uL (ref 4.0–10.5)
nRBC: 0 % (ref 0.0–0.2)

## 2019-06-01 LAB — BASIC METABOLIC PANEL
Anion gap: 12 (ref 5–15)
BUN: 39 mg/dL — ABNORMAL HIGH (ref 6–20)
CO2: 24 mmol/L (ref 22–32)
Calcium: 7.2 mg/dL — ABNORMAL LOW (ref 8.9–10.3)
Chloride: 100 mmol/L (ref 98–111)
Creatinine, Ser: 8.63 mg/dL — ABNORMAL HIGH (ref 0.44–1.00)
GFR calc Af Amer: 6 mL/min — ABNORMAL LOW (ref >60)
GFR calc non Af Amer: 5 mL/min — ABNORMAL LOW (ref >60)
Glucose, Bld: 171 mg/dL — ABNORMAL HIGH (ref 70–99)
Potassium: 3.5 mmol/L (ref 3.5–5.1)
Sodium: 136 mmol/L (ref 135–145)

## 2019-06-01 LAB — GLUCOSE, CAPILLARY
Glucose-Capillary: 154 mg/dL — ABNORMAL HIGH (ref 70–99)
Glucose-Capillary: 157 mg/dL — ABNORMAL HIGH (ref 70–99)
Glucose-Capillary: 172 mg/dL — ABNORMAL HIGH (ref 70–99)
Glucose-Capillary: 188 mg/dL — ABNORMAL HIGH (ref 70–99)
Glucose-Capillary: 283 mg/dL — ABNORMAL HIGH (ref 70–99)

## 2019-06-01 LAB — HIV ANTIBODY (ROUTINE TESTING W REFLEX): HIV Screen 4th Generation wRfx: NONREACTIVE

## 2019-06-01 MED ORDER — DIPHENHYDRAMINE HCL 50 MG/ML IJ SOLN
25.0000 mg | Freq: Once | INTRAMUSCULAR | Status: AC
  Administered 2019-06-01: 25 mg via INTRAVENOUS

## 2019-06-01 MED ORDER — PENTAFLUOROPROP-TETRAFLUOROETH EX AERO: 1.0000 "application " | INHALATION_SPRAY | CUTANEOUS | Status: DC | PRN

## 2019-06-01 MED ORDER — LIDOCAINE-EPINEPHRINE (PF) 1 %-1:200000 IJ SOLN
INTRAMUSCULAR | Status: AC | PRN
  Administered 2019-06-01: 10 mL

## 2019-06-01 MED ORDER — SODIUM CHLORIDE 0.9 % IV SOLN: 100.0000 mL | INTRAVENOUS | Status: DC | PRN

## 2019-06-01 MED ORDER — HEPARIN SODIUM (PORCINE) 1000 UNIT/ML DIALYSIS
1000.0000 [IU] | INTRAMUSCULAR | Status: DC | PRN
  Administered 2019-06-01: 18:00:00 1000 [IU] via INTRAVENOUS_CENTRAL
  Filled 2019-06-01 (×2): qty 1

## 2019-06-01 MED ORDER — VANCOMYCIN HCL IN DEXTROSE 1-5 GM/200ML-% IV SOLN
1000.0000 mg | INTRAVENOUS | Status: AC
  Administered 2019-06-01: 1000 mg via INTRAVENOUS

## 2019-06-01 MED ORDER — DIPHENHYDRAMINE HCL 50 MG/ML IJ SOLN
INTRAMUSCULAR | Status: AC
  Administered 2019-06-01: 25 mg via INTRAVENOUS
  Filled 2019-06-01: qty 1

## 2019-06-01 MED ORDER — HEPARIN SODIUM (PORCINE) 1000 UNIT/ML DIALYSIS
40.0000 [IU]/kg | INTRAMUSCULAR | Status: DC | PRN
  Filled 2019-06-01: qty 5

## 2019-06-01 MED ORDER — LIDOCAINE-PRILOCAINE 2.5-2.5 % EX CREA
1.0000 "application " | TOPICAL_CREAM | CUTANEOUS | Status: DC | PRN
  Filled 2019-06-01: qty 5

## 2019-06-01 MED ORDER — LIDOCAINE HCL (PF) 1 % IJ SOLN: 5.0000 mL | INTRAMUSCULAR | Status: DC | PRN

## 2019-06-01 MED ORDER — RENA-VITE PO TABS
1.0000 | ORAL_TABLET | Freq: Every day | ORAL | Status: DC
  Filled 2019-06-01: qty 1

## 2019-06-01 MED ORDER — CEFAZOLIN SODIUM-DEXTROSE 1-4 GM/50ML-% IV SOLN
INTRAVENOUS | Status: AC | PRN
  Administered 2019-06-01: 2 g via INTRAVENOUS

## 2019-06-01 MED ORDER — CEFAZOLIN SODIUM-DEXTROSE 2-4 GM/100ML-% IV SOLN
INTRAVENOUS | Status: AC
  Filled 2019-06-01: qty 100

## 2019-06-01 MED ORDER — SODIUM CHLORIDE 0.9 % IV SOLN
INTRAVENOUS | Status: AC | PRN
  Administered 2019-06-01: 10 mL/h via INTRAVENOUS

## 2019-06-01 MED ORDER — LACTULOSE 10 GM/15ML PO SOLN
20.0000 g | Freq: Two times a day (BID) | ORAL | Status: DC
  Administered 2019-06-01 – 2019-06-02 (×2): 20 g via ORAL
  Filled 2019-06-01 (×2): qty 30

## 2019-06-01 MED ORDER — FENTANYL CITRATE (PF) 100 MCG/2ML IJ SOLN
INTRAMUSCULAR | Status: AC
  Filled 2019-06-01: qty 2

## 2019-06-01 MED ORDER — HEPARIN SODIUM (PORCINE) 1000 UNIT/ML IJ SOLN
INTRAMUSCULAR | Status: AC
  Filled 2019-06-01: qty 1

## 2019-06-01 MED ORDER — ALTEPLASE 2 MG IJ SOLR: 2.0000 mg | Freq: Once | INTRAMUSCULAR | Status: DC | PRN

## 2019-06-01 MED ORDER — MIDAZOLAM HCL 2 MG/2ML IJ SOLN
INTRAMUSCULAR | Status: AC
  Filled 2019-06-01: qty 2

## 2019-06-01 MED ORDER — MIDAZOLAM HCL 2 MG/2ML IJ SOLN
INTRAMUSCULAR | Status: AC | PRN
  Administered 2019-06-01 (×2): 1 mg via INTRAVENOUS

## 2019-06-01 MED ORDER — FENTANYL CITRATE (PF) 100 MCG/2ML IJ SOLN
INTRAMUSCULAR | Status: AC | PRN
  Administered 2019-06-01 (×2): 50 ug via INTRAVENOUS

## 2019-06-01 MED ORDER — LIDOCAINE-EPINEPHRINE (PF) 1 %-1:200000 IJ SOLN
INTRAMUSCULAR | Status: AC
  Filled 2019-06-01: qty 30

## 2019-06-01 MED ORDER — VANCOMYCIN HCL IN DEXTROSE 1-5 GM/200ML-% IV SOLN
INTRAVENOUS | Status: AC
  Filled 2019-06-01: qty 200

## 2019-06-01 MED ORDER — HEPARIN SODIUM (PORCINE) 1000 UNIT/ML IJ SOLN
INTRAMUSCULAR | Status: AC
  Administered 2019-06-01: 1000 [IU] via INTRAVENOUS_CENTRAL
  Filled 2019-06-01: qty 2

## 2019-06-01 NOTE — Progress Notes (Signed)
Pt transfer to IR for hemodialysis cath placement, NPO midnight, alert and oriented.

## 2019-06-01 NOTE — Progress Notes (Signed)
Patient ID: Heather [name], female   DOB: 07/28/1971, 48 y.o.   MRN: [id number] [facility] Progress Note   Assessment/ Plan:   1.  Peritonitis in patient on peritoneal dialysis: Unfortunately unable to aspirate dialysate through the catheter in spite of instilling TPA.  Will attempt laxatives for bowel movement/potential interference with catheter tip and reattempt aspiration for comfort.  If still not able to draw fluid via PD catheter, will request surgical service to assist with removing the catheter otherwise can be removed as an outpatient as infection appears to be under control and she is not septic.  Status post vancomycin and Fortaz. 2. ESRD: Transitioning modalities now to hemodialysis.  [name] status post right IJ [facility] and will undergo hemodialysis today, process initiated for outpatient dialysis unit placement likely to [facility] kidney center. 3. Anemia: Low hemoglobin/hematocrit likely arising from peritonitis/ESA resistance.  Continue antimicrobial therapy and ESA. 4. CKD-MBD: Calcium and phosphorus levels currently acceptable, continue renal diet.  Not on binder, reconsult medications for VDRA. 5. Nutrition: Continue renal diet with renal MVI. 6. Hypertension: Pressure mildly elevated, monitor with hemodialysis/ultrafiltration  Subjective:   Reports to be feeling fair with intermittent right lower quadrant abdominal pain.  Underwent TDC placement earlier today.   Objective:   BP (!) 149/79   Pulse 86   Temp 98.2 F (36.8 C) (Oral)   Resp 11   Ht 5\' 9"  (1.753 m)   Wt 108.9 kg   SpO2 100%   BMI 35.45 kg/m   Physical Exam: Gen: Comfortably resting in bed, eating breakfast CVS: Pulse regular rhythm, normal rate, S1 and S2 normal Resp: Clear to auscultation bilaterally.  Right IJ TDC. Abd: Soft, mild to moderately distended with lower quadrant tenderness.  No guarding. Ext: 1+ lower extremity edema.  Labs: BMET Recent Labs  Lab 05/30/19 1816  05/31/19 0355 05/31/19 0519 06/01/19 0448  NA 133* 133*  --  136  K 3.7 3.8  --  3.5  CL 96* 98  --  100  CO2 21* 20*  --  24  GLUCOSE 393* 314*  --  171*  BUN 37* 37*  --  39*  CREATININE 8.13* 8.08*  --  8.63*  CALCIUM 7.6* 7.3*  --  7.2*  PHOS  --   --  4.9*  --    CBC Recent Labs  Lab 05/30/19 1816 05/31/19 0355 06/01/19 0448  WBC 9.1 9.0 7.9  NEUTROABS  --  6.4  --   HGB 10.2* 9.3* 8.3*  HCT 34.8* 31.2* 27.2*  MCV 100.0 98.7 95.1  PLT 322 305 302   Medications:    . bisacodyl  5 mg Oral Once  . Chlorhexidine Gluconate Cloth  6 each Topical Q0600  . fentaNYL      . fentaNYL (SUBLIMAZE) injection  100 mcg Intravenous Once  . heparin      . hydrALAZINE  10 mg Intravenous Once  . insulin aspart  0-9 Units Subcutaneous Q4H  . insulin glargine  15 Units Subcutaneous QHS  . lactulose  20 g Oral BID  . lidocaine-EPINEPHrine      . midazolam      . nystatin  500,000 Units Oral Q8H  . sodium chloride flush  3 mL Intravenous Once  . sodium chloride flush  3 mL Intravenous Q12H  . sodium chloride flush  3 mL Intravenous Q12H  . vancomycin variable dose per unstable renal function (pharmacist dosing)   Does not apply See admin instructions  Elmarie Shiley, MD 06/01/2019, 10:21 AM

## 2019-06-01 NOTE — Progress Notes (Signed)
Pt. back from dialysis

## 2019-06-01 NOTE — Progress Notes (Signed)
   06/01/19 1715  Hand-Off documentation  Report given to (Full Name) Dess Estrella RN  Report received from (Full Name) Delrae Rend  Vital Signs  Temp 98.7 F (37.1 C)  Temp Source Oral  Pulse Rate 88  Resp 18  BP 123/77  Oxygen Therapy  SpO2 99 %  O2 Device Room Air  Pain Assessment  Pain Scale 0-10  Pain Score 0  Dialysis Weight  Weight 106.4 kg  Type of Weight Post-Dialysis  During Hemodialysis Assessment  Intra-Hemodialysis Comments Tx completed  Post-Hemodialysis Assessment  Rinseback Volume (mL) 250 mL  KECN 210 V  Dialyzer Clearance Clotted  Duration of HD Treatment -hour(s) 3.5 hour(s)  Hemodialysis Intake (mL) 500 mL  UF Total -Machine (mL) 3000 mL  Net UF (mL) 2500 mL  Tolerated HD Treatment Yes  Post-Hemodialysis Comments Stable tx  Hemodialysis Catheter Right Internal jugular Double lumen Permanent  Placement Date/Time: 06/01/19 0843   Placed prior to admission: No  Time Out: Correct patient;Correct site;Correct procedure  Maximum sterile barrier precautions: Hand hygiene;Cap;Large sterile sheet;Sterile gloves;Sterile gown  Site Prep: Chlorhexidi...  Site Condition No complications  Blue Lumen Status Saline locked;Heparin locked  Red Lumen Status Saline locked;Heparin locked  Purple Lumen Status N/A  Catheter fill solution Heparin 1000 units/ml  Catheter fill volume (Arterial) 1.6 cc  Catheter fill volume (Venous) 1.6  Dressing Type Biopatch;Other (Comment)  Dressing Status Clean;Dry;Intact  Interventions New dressing  Drainage Description None  Dressing Change Due 06/08/19  Post treatment catheter status Capped and Clamped

## 2019-06-01 NOTE — Procedures (Signed)
Interventional Radiology Procedure Note  Procedure: Right IJ 19 cm Palindrome tunneled HD catheter.   Complications: None.  Estimated Blood Loss: None.  Recommendations: - Routine line care   Signed,  Criselda Peaches, MD

## 2019-06-01 NOTE — Progress Notes (Signed)
Pt back from IR s/p left chest Hemodialysis cath, site dry and intact.

## 2019-06-01 NOTE — Progress Notes (Signed)
Pharmacy Antibiotic Note  Heather [name] is a 48 y.o. female admitted on 05/30/2019 with peritonitis associated with peritoneal dialysis. HD catheter placed this AM. Transitioning modalities from PD to HD. Plan for HD session today. Pharmacy has been consulted for Vancomycin and Ceftazidime dosing.  Plan: Vancomycin 1g IV x 1 after HD today F/u HD schedule for future doses Continue Ceftazidime 1g IV q24h Monitor cultures, clinical course  Height: 5\' 9"  (175.3 cm) Weight: 240 lb 1.3 oz (108.9 kg) IBW/kg (Calculated) : 66.2  Temp (24hrs), Avg:98.3 F (36.8 C), Min:98.1 F (36.7 C), Max:98.4 F (36.9 C)  Recent Labs  Lab 05/30/19 1816 05/31/19 0355 06/01/19 0448  WBC 9.1 9.0 7.9  CREATININE 8.13* 8.08* 8.63*    Estimated Creatinine Clearance: 10.4 mL/min (A) (by C-G formula based on SCr of 8.63 mg/dL (H)).    Allergies  Allergen Reactions  . Ibuprofen Other (See Comments)    CKD stage 3. Should avoid.  . Dilaudid [Hydromorphone Hcl] Itching and Other (See Comments)    Can take with Benadryl.  . Tramadol Itching     Lindell Spar, PharmD, BCPS Clinical Pharmacist [phone] 06/01/2019 11:41 AM

## 2019-06-01 NOTE — Progress Notes (Signed)
Pt off machine 15 mins early d/t clotting MD aware

## 2019-06-01 NOTE — Progress Notes (Signed)
[facility] PROGRESS NOTE  Heather [name] WLN:[id number] DOB: [birth year] DOA: 05/30/2019 PCP: Briscoe Deutscher, DO  Assessment/Plan:  1.Peritoneal catheter dysfunction; possible peritonitis; ESRDPatient remains afebrile and hemodynamically stable.Nephrology input appreciated. Cultures no growth after 1 day. Per nephrology unable to aspirate dialysate through catheter. Plan is to attempt laxitives and then re-attempt aspirate for comfort. If unsuccessful, nephrology will request surgical service to remove catheter.  - continue antibiotic and prophylactic antifungal.  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 Managed with [name] and Novolog at home -continue  Lantus and Novolog while in hospital  3.HypertensionBP fair control. Of note she had been admitted to [facility] with hypotension in March, had dialysis parameters adjusted, and was started on midodrine --monitor closely  4.AnemiaHgb is 10.2 on admission, similar to priors and with no bleeding   5. ESRD. To IR today for cath. -dialysis per nephrology   Code Status: full Family Communication: patient at bedside Disposition Plan: home when ready   Consultants:  patel nephrology  Procedures:  Dialysis 06/01/19  IR for cath placement 06/01/19  Antibiotics:  Vancomycin 6/4>>>  fortaz 6/4>>  HPI/Subjective: Sitting up in bed eating lunch. No acute distress. Denies pain/discomfort  Objective: Vitals:   06/01/19 0850 06/01/19 1037  BP: (!) 149/79 (!) 143/70  Pulse: 86 91  Resp: 11 18  Temp:  98.1 F (36.7 C)  SpO2: 100% 94%    Intake/Output Summary (Last 24 hours) at 06/01/2019 1401 Last data filed at 06/01/2019 1037 Gross per 24 hour  Intake 361 ml  Output -  Net 361 ml   Filed Weights   05/31/19 0442 05/31/19 1846  Weight: 109.9 kg 108.9 kg    Exam:   General:  Awake alert no acute distress  Cardiovascular: rrr no mgr trace LE edema  Respiratory: normal effort  fair air movement no wheeze/crackles  Abdomen: obese soft +BS no guarding or rebounding  Musculoskeletal: joints without swelling/erythema   Data Reviewed: Basic Metabolic Panel: Recent Labs  Lab 05/30/19 1816 05/31/19 0355 05/31/19 0519 06/01/19 0448  NA 133* 133*  --  136  K 3.7 3.8  --  3.5  CL 96* 98  --  100  CO2 21* 20*  --  24  GLUCOSE 393* 314*  --  171*  BUN 37* 37*  --  39*  CREATININE 8.13* 8.08*  --  8.63*  CALCIUM 7.6* 7.3*  --  7.2*  PHOS  --   --  4.9*  --    Liver Function Tests: Recent Labs  Lab 05/30/19 1816  AST 14*  ALT 14  ALKPHOS 140*  BILITOT 0.8  PROT 7.7  ALBUMIN 2.6*   Recent Labs  Lab 05/30/19 1816  LIPASE 65*   No results for input(s): AMMONIA in the last 168 hours. CBC: Recent Labs  Lab 05/30/19 1816 05/31/19 0355 06/01/19 0448  WBC 9.1 9.0 7.9  NEUTROABS  --  6.4  --   HGB 10.2* 9.3* 8.3*  HCT 34.8* 31.2* 27.2*  MCV 100.0 98.7 95.1  PLT 322 305 302   Cardiac Enzymes: No results for input(s): CKTOTAL, CKMB, CKMBINDEX, TROPONINI in the last 168 hours. BNP (last 3 results) No results for input(s): BNP in the last 8760 hours.  ProBNP (last 3 results) No results for input(s): PROBNP in the last 8760 hours.  CBG: Recent Labs  Lab 05/31/19 2009 05/31/19 2357 06/01/19 0444 06/01/19 0917 06/01/19 1156  GLUCAP 148* 157* 154* 172* 283*    Recent Results (from the  past 240 hour(s))  SARS Coronavirus 2 (CEPHEID - Performed in [place] hospital lab), Hosp Order     Status: None   Collection Time: 05/31/19  1:36 AM  Result Value Ref Range Status   SARS Coronavirus 2 NEGATIVE NEGATIVE Final    Comment: (NOTE) If result is NEGATIVE SARS-CoV-2 target nucleic acids are NOT DETECTED. The SARS-CoV-2 RNA is generally detectable in upper and lower  respiratory specimens during the acute phase of infection. The lowest  concentration of SARS-CoV-2 viral copies this assay can detect is 250  copies / mL. A negative result does  not preclude SARS-CoV-2 infection  and should not be used as the sole basis for treatment or other  patient management decisions.  A negative result may occur with  improper specimen collection / handling, submission of specimen other  than nasopharyngeal swab, presence of viral mutation(s) within the  areas targeted by this assay, and inadequate number of viral copies  (<250 copies / mL). A negative result must be combined with clinical  observations, patient history, and epidemiological information. If result is POSITIVE SARS-CoV-2 target nucleic acids are DETECTED. The SARS-CoV-2 RNA is generally detectable in upper and lower  respiratory specimens dur ing the acute phase of infection.  Positive  results are indicative of active infection with SARS-CoV-2.  Clinical  correlation with patient history and other diagnostic information is  necessary to determine patient infection status.  Positive results do  not rule out bacterial infection or co-infection with other viruses. If result is PRESUMPTIVE POSTIVE SARS-CoV-2 nucleic acids MAY BE PRESENT.   A presumptive positive result was obtained on the submitted specimen  and confirmed on repeat testing.  While 2019 novel coronavirus  (SARS-CoV-2) nucleic acids may be present in the submitted sample  additional confirmatory testing may be necessary for epidemiological  and / or clinical management purposes  to differentiate between  SARS-CoV-2 and other Sarbecovirus currently known to infect humans.  If clinically indicated additional testing with an alternate test  methodology [phone]) is advised. The SARS-CoV-2 RNA is generally  detectable in upper and lower respiratory sp ecimens during the acute  phase of infection. The expected result is Negative. Fact Sheet for Patients:  StrictlyIdeas.no Fact Sheet for Healthcare Providers: BankingDealers.co.za This test is not yet approved or  cleared by the [place] FDA and has been authorized for detection and/or diagnosis of SARS-CoV-2 by FDA under an Emergency Use Authorization (EUA).  This EUA will remain in effect (meaning this test can be used) for the duration of the COVID-19 declaration under Section 564(b)(1) of the Act, 21 U.S.C. section 360bbb-3(b)(1), unless the authorization is terminated or revoked sooner. Performed at [facility], [place] [address], [place], [place] [address]   Blood culture (routine x 2)     Status: None (Preliminary result)   Collection Time: 05/31/19  2:45 AM  Result Value Ref Range Status   Specimen Description BLOOD RIGHT ARM  Final   Special Requests   Final    BOTTLES DRAWN AEROBIC AND ANAEROBIC Blood Culture results may not be optimal due to an excessive volume of blood received in culture bottles   Culture   Final    NO GROWTH 1 DAY Performed at [facility], [place] [address] [place], [place] [address]    Report Status PENDING  Incomplete  Blood culture (routine x 2)     Status: None (Preliminary result)   Collection Time: 05/31/19  4:57 AM  Result Value  Ref Range Status   Specimen Description BLOOD RIGHT HAND  Final   Special Requests   Final    BOTTLES DRAWN AEROBIC ONLY Blood Culture results may not be optimal due to an inadequate volume of blood received in culture bottles   Culture   Final    NO GROWTH 1 DAY Performed at [place] [address], [place], [place] [address]    Report Status PENDING  Incomplete  Body fluid culture     Status: None (Preliminary result)   Collection Time: 05/31/19  5:40 AM  Result Value Ref Range Status   Specimen Description PERITONEAL DIALYSATE  Final   Special Requests Normal  Final   Gram Stain   Final    CYTOSPIN SMEAR WBC PRESENT, PREDOMINANTLY PMN NO ORGANISMS SEEN    Culture   Final    NO GROWTH 1 DAY Performed at [facility], [place] [address], [place], [place] [address]    Report Status PENDING   Incomplete     Studies: Ir Fluoro Guide Cv Line Right  Result Date: 06/01/2019 INDICATION: 48 year old female with end-stage renal disease in need of hemodialysis. She presents for placement of a tunneled hemodialysis catheter. EXAM: TUNNELED CENTRAL VENOUS HEMODIALYSIS CATHETER PLACEMENT WITH ULTRASOUND AND FLUOROSCOPIC GUIDANCE MEDICATIONS: 2 g Ancef. The antibiotic was given in an appropriate time interval prior to skin puncture. ANESTHESIA/SEDATION: Moderate (conscious) sedation was employed during this procedure. A total of Versed 2 mg and Fentanyl 100 mcg was administered intravenously. Moderate Sedation Time: 18 minutes. The patient's level of consciousness and vital signs were monitored continuously by radiology nursing throughout the procedure under my direct supervision. FLUOROSCOPY TIME:  Fluoroscopy Time: 0 minutes 24 seconds (14 mGy). COMPLICATIONS: None immediate. PROCEDURE: Informed written consent was obtained from the patient after a discussion of the risks, benefits, and alternatives to treatment. Questions regarding the procedure were encouraged and answered. The right neck and chest were prepped with chlorhexidine in a sterile fashion, and a sterile drape was applied covering the operative field. Maximum barrier sterile technique with sterile gowns and gloves were used for the procedure. A timeout was performed prior to the initiation of the procedure. After creating a small venotomy incision, a micropuncture kit was utilized to access the right internal jugular vein under direct, real-time ultrasound guidance after the overlying soft tissues were anesthetized with 1% lidocaine with epinephrine. Ultrasound image documentation was performed. The microwire was kinked to measure appropriate catheter length. A stiff Glidewire was advanced to the level of the IVC and the micropuncture sheath was exchanged for a peel-away sheath. A palindrome tunneled hemodialysis catheter measuring 19 cm from tip  to cuff was tunneled in a retrograde fashion from the anterior chest wall to the venotomy incision. The catheter was then placed through the peel-away sheath with tips ultimately positioned within the superior aspect of the right atrium. Final catheter positioning was confirmed and documented with a spot radiographic image. The catheter aspirates and flushes normally. The catheter was flushed with appropriate volume heparin dwells. The catheter exit site was secured with a 0-Prolene retention suture. The venotomy incision was closed Dermabond. Dressings were applied. The patient tolerated the procedure well without immediate post procedural complication. IMPRESSION: Successful placement of 19 cm tip to cuff tunneled hemodialysis catheter via the right internal jugular vein with tips terminating within the superior aspect of the right atrium. The catheter is ready for immediate use. Electronically Signed   By: Dellis Filbert.D.  On: 06/01/2019 10:39   Ir US Guide Vasc Access Right  Result Date: 06/01/2019 INDICATION: 48 year old female with end-stage renal disease in need of hemodialysis. She presents for placement of a tunneled hemodialysis catheter. EXAM: TUNNELED CENTRAL VENOUS HEMODIALYSIS CATHETER PLACEMENT WITH ULTRASOUND AND FLUOROSCOPIC GUIDANCE MEDICATIONS: 2 g Ancef. The antibiotic was given in an appropriate time interval prior to skin puncture. ANESTHESIA/SEDATION: Moderate (conscious) sedation was employed during this procedure. A total of Versed 2 mg and Fentanyl 100 mcg was administered intravenously. Moderate Sedation Time: 18 minutes. The patient's level of consciousness and vital signs were monitored continuously by radiology nursing throughout the procedure under my direct supervision. FLUOROSCOPY TIME:  Fluoroscopy Time: 0 minutes 24 seconds (14 mGy). COMPLICATIONS: None immediate. PROCEDURE: Informed written consent was obtained from the patient after a discussion of the risks, benefits,  and alternatives to treatment. Questions regarding the procedure were encouraged and answered. The right neck and chest were prepped with chlorhexidine in a sterile fashion, and a sterile drape was applied covering the operative field. Maximum barrier sterile technique with sterile gowns and gloves were used for the procedure. A timeout was performed prior to the initiation of the procedure. After creating a small venotomy incision, a micropuncture kit was utilized to access the right internal jugular vein under direct, real-time ultrasound guidance after the overlying soft tissues were anesthetized with 1% lidocaine with epinephrine. Ultrasound image documentation was performed. The microwire was kinked to measure appropriate catheter length. A stiff Glidewire was advanced to the level of the IVC and the micropuncture sheath was exchanged for a peel-away sheath. A palindrome tunneled hemodialysis catheter measuring 19 cm from tip to cuff was tunneled in a retrograde fashion from the anterior chest wall to the venotomy incision. The catheter was then placed through the peel-away sheath with tips ultimately positioned within the superior aspect of the right atrium. Final catheter positioning was confirmed and documented with a spot radiographic image. The catheter aspirates and flushes normally. The catheter was flushed with appropriate volume heparin dwells. The catheter exit site was secured with a 0-Prolene retention suture. The venotomy incision was closed Dermabond. Dressings were applied. The patient tolerated the procedure well without immediate post procedural complication. IMPRESSION: Successful placement of 19 cm tip to cuff tunneled hemodialysis catheter via the right internal jugular vein with tips terminating within the superior aspect of the right atrium. The catheter is ready for immediate use. Electronically Signed   By: Jacqulynn Cadet M.D.   On: 06/01/2019 10:39    Scheduled Meds: . bisacodyl   5 mg Oral Once  . Chlorhexidine Gluconate Cloth  6 each Topical Q0600  . fentaNYL      . fentaNYL (SUBLIMAZE) injection  100 mcg Intravenous Once  . heparin      . hydrALAZINE  10 mg Intravenous Once  . insulin aspart  0-9 Units Subcutaneous Q4H  . insulin glargine  15 Units Subcutaneous QHS  . lactulose  20 g Oral BID  . lidocaine-EPINEPHrine      . midazolam      . multivitamin  1 tablet Oral QHS  . nystatin  500,000 Units Oral Q8H  . sodium chloride flush  3 mL Intravenous Once  . sodium chloride flush  3 mL Intravenous Q12H  . vancomycin variable dose per unstable renal function (pharmacist dosing)   Does not apply See admin instructions   Continuous Infusions: . sodium chloride    . ceFAZolin    . cefTAZidime (FORTAZ)  IV    .  vancomycin      Principal Problem:   PD catheter dysfunction (HCC) Active Problems:   Peritonitis, dialysis-associated, initial encounter ([place])   ESRD on peritoneal dialysis ([place])   Secondary hyperparathyroidism ([place])   Grade I diastolic dysfunction   Renovascular hypertension   Anemia of chronic disease   Uncontrolled type 2 diabetes mellitus with diabetic polyneuropathy, with long-term current use of insulin ([place])    Time spent: 62 minutes    [place] NP  [facility]  If 7PM-7AM, please contact night-coverage at [email or web address], password [facility] 06/01/2019, 2:01 PM  LOS: 1 day

## 2019-06-01 NOTE — Progress Notes (Signed)
Patient has been accepted at [facility] with a TTS schedule seat time of 5:50am. She can start at OP HD clinic for first treatment on Tuesday, 06/05/19. She needs to report to the clinic on Monday, 06/04/19 before 5pm in order to sign paperwork if possible.  Renal Navigator notified Neprologist/Dr. Posey Pronto and patient of above.  Patient is cleared for discharge from an OP HD standpoint.  Alphonzo Cruise, [place] Renal Navigator [phone]

## 2019-06-01 NOTE — Plan of Care (Signed)
  Problem: Clinical Measurements: Goal: Ability to maintain clinical measurements within normal limits will improve Outcome: Progressing   Problem: Clinical Measurements: Goal: Will remain free from infection Outcome: Progressing   Problem: Clinical Measurements: Goal: Diagnostic test results will improve Outcome: Progressing   Problem: Nutrition: Goal: Adequate nutrition will be maintained Outcome: Progressing   

## 2019-06-01 NOTE — Progress Notes (Signed)
MEDICATION-RELATED CONSULT NOTE   IR Procedure Consult - Anticoagulant/Antiplatelet PTA/Inpatient Med List Review by Pharmacist    Procedure: Right IJ tunneled HD catheter    Completed: 06/01/2019 at 0900  Post-Procedural bleeding risk per IR MD assessment:  low  Antithrombotic medications on inpatient or PTA profile prior to procedure:  None (SCDs ordered for DVT prophylaxis)    Recommended restart time per IR Post-Procedure Guidelines: N/A     Lindell Spar, PharmD, BCPS Clinical Pharmacist  [phone] 06/01/2019 9:30 AM

## 2019-06-01 NOTE — Progress Notes (Signed)
Pt transfer to dialysis . 

## 2019-06-01 NOTE — Procedures (Signed)
Patient seen on Hemodialysis. BP (!) 143/70 (BP Location: Right Arm)   Pulse 91   Temp 98.1 F (36.7 C) (Oral)   Resp 18   Ht 5\' 9"  (1.753 m)   Wt 108.9 kg   SpO2 94%   BMI 35.45 kg/m   QB 300, UF goal 2.5L Tolerating treatment without complaints at this time.   Elmarie Shiley MD [facility]. Office # [phone] Pager # [phone] 2:07 PM

## 2019-06-02 LAB — CBC
HCT: 25.5 % — ABNORMAL LOW (ref 36.0–46.0)
HCT: 26.5 % — ABNORMAL LOW (ref 36.0–46.0)
Hemoglobin: 7.9 g/dL — ABNORMAL LOW (ref 12.0–15.0)
Hemoglobin: 8.1 g/dL — ABNORMAL LOW (ref 12.0–15.0)
MCH: 29.4 pg (ref 26.0–34.0)
MCH: 28.8 pg (ref 26.0–34.0)
MCHC: 31 g/dL (ref 30.0–36.0)
MCHC: 30.6 g/dL (ref 30.0–36.0)
MCV: 94.8 fL (ref 80.0–100.0)
MCV: 94.3 fL (ref 80.0–100.0)
Platelets: 213 10*3/uL (ref 150–400)
Platelets: 231 10*3/uL (ref 150–400)
RBC: 2.69 MIL/uL — ABNORMAL LOW (ref 3.87–5.11)
RBC: 2.81 MIL/uL — ABNORMAL LOW (ref 3.87–5.11)
RDW: 11.9 % (ref 11.5–15.5)
RDW: 11.9 % (ref 11.5–15.5)
WBC: 6.8 10*3/uL (ref 4.0–10.5)
WBC: 7.7 10*3/uL (ref 4.0–10.5)
nRBC: 0 % (ref 0.0–0.2)
nRBC: 0 % (ref 0.0–0.2)

## 2019-06-02 LAB — RENAL FUNCTION PANEL
Albumin: 2 g/dL — ABNORMAL LOW (ref 3.5–5.0)
Anion gap: 11 (ref 5–15)
BUN: 25 mg/dL — ABNORMAL HIGH (ref 6–20)
CO2: 25 mmol/L (ref 22–32)
Calcium: 7.6 mg/dL — ABNORMAL LOW (ref 8.9–10.3)
Chloride: 98 mmol/L (ref 98–111)
Creatinine, Ser: 6.37 mg/dL — ABNORMAL HIGH (ref 0.44–1.00)
GFR calc Af Amer: 8 mL/min — ABNORMAL LOW (ref >60)
GFR calc non Af Amer: 7 mL/min — ABNORMAL LOW (ref >60)
Glucose, Bld: 160 mg/dL — ABNORMAL HIGH (ref 70–99)
Phosphorus: 4.2 mg/dL (ref 2.5–4.6)
Potassium: 3.4 mmol/L — ABNORMAL LOW (ref 3.5–5.1)
Sodium: 134 mmol/L — ABNORMAL LOW (ref 135–145)

## 2019-06-02 LAB — BASIC METABOLIC PANEL
Anion gap: 10 (ref 5–15)
BUN: 22 mg/dL — ABNORMAL HIGH (ref 6–20)
CO2: 25 mmol/L (ref 22–32)
Calcium: 7.5 mg/dL — ABNORMAL LOW (ref 8.9–10.3)
Chloride: 101 mmol/L (ref 98–111)
Creatinine, Ser: 5.93 mg/dL — ABNORMAL HIGH (ref 0.44–1.00)
GFR calc Af Amer: 9 mL/min — ABNORMAL LOW (ref >60)
GFR calc non Af Amer: 8 mL/min — ABNORMAL LOW (ref >60)
Glucose, Bld: 189 mg/dL — ABNORMAL HIGH (ref 70–99)
Potassium: 3 mmol/L — ABNORMAL LOW (ref 3.5–5.1)
Sodium: 136 mmol/L (ref 135–145)

## 2019-06-02 LAB — GLUCOSE, CAPILLARY
Glucose-Capillary: 135 mg/dL — ABNORMAL HIGH (ref 70–99)
Glucose-Capillary: 158 mg/dL — ABNORMAL HIGH (ref 70–99)
Glucose-Capillary: 160 mg/dL — ABNORMAL HIGH (ref 70–99)
Glucose-Capillary: 174 mg/dL — ABNORMAL HIGH (ref 70–99)
Glucose-Capillary: 197 mg/dL — ABNORMAL HIGH (ref 70–99)

## 2019-06-02 MED ORDER — VANCOMYCIN HCL IN DEXTROSE 1-5 GM/200ML-% IV SOLN
1000.0000 mg | INTRAVENOUS | Status: AC
  Administered 2019-06-02: 22:00:00 1000 mg via INTRAVENOUS

## 2019-06-02 MED ORDER — CAMPHOR-MENTHOL 0.5-0.5 % EX LOTN
TOPICAL_LOTION | CUTANEOUS | Status: DC | PRN
  Administered 2019-06-02: 12:00:00 via TOPICAL
  Filled 2019-06-02: qty 222

## 2019-06-02 MED ORDER — HEPARIN SODIUM (PORCINE) 1000 UNIT/ML IJ SOLN
INTRAMUSCULAR | Status: AC
  Administered 2019-06-03: 3200 [IU]
  Filled 2019-06-02: qty 3

## 2019-06-02 MED ORDER — GABAPENTIN 100 MG PO CAPS: 300.0000 mg | ORAL_CAPSULE | ORAL | Status: DC | PRN

## 2019-06-02 MED ORDER — DIPHENHYDRAMINE HCL 50 MG/ML IJ SOLN
INTRAMUSCULAR | Status: AC
  Administered 2019-06-02: 12.5 mg via INTRAVENOUS
  Filled 2019-06-02: qty 1

## 2019-06-02 MED ORDER — GABAPENTIN 300 MG PO CAPS
300.0000 mg | ORAL_CAPSULE | Freq: Once | ORAL | Status: AC
  Administered 2019-06-02: 300 mg via ORAL
  Filled 2019-06-02: qty 1

## 2019-06-02 MED ORDER — DIPHENHYDRAMINE HCL 50 MG/ML IJ SOLN
12.5000 mg | Freq: Four times a day (QID) | INTRAMUSCULAR | Status: DC | PRN
  Administered 2019-06-02 (×2): 12.5 mg via INTRAVENOUS
  Filled 2019-06-02: qty 1

## 2019-06-02 MED ORDER — OXYCODONE HCL 5 MG PO TABS: 5.0000 mg | ORAL_TABLET | ORAL | 0 refills | Status: DC | PRN

## 2019-06-02 MED ORDER — FLUCONAZOLE 150 MG PO TABS
150.0000 mg | ORAL_TABLET | Freq: Every day | ORAL | Status: DC
  Administered 2019-06-02: 150 mg via ORAL
  Filled 2019-06-02: qty 1

## 2019-06-02 MED ORDER — VANCOMYCIN HCL IN DEXTROSE 1-5 GM/200ML-% IV SOLN
INTRAVENOUS | Status: AC
  Administered 2019-06-02: 1000 mg via INTRAVENOUS
  Filled 2019-06-02: qty 200

## 2019-06-02 NOTE — Progress Notes (Signed)
Pharmacy Antibiotic Note  Heather [name] is a 48 y.o. female admitted on 05/30/2019 with peritonitis associated with peritoneal dialysis. HD catheter placed this AM. Transitioning modalities from PD to HD. Plan for HD session today. Pharmacy has been consulted for Vancomycin and Ceftazidime dosing.  Plan: Vancomycin 1g IV x 1 after HD today Continue Ceftazidime 1g IV q24h Monitor cultures, clinical course Per notes, she has been accepted at [facility] and can start HD there on Tuesday, 06/05/2019  Height: 5\' 9"  (175.3 cm) Weight: 234 lb 9.1 oz (106.4 kg) IBW/kg (Calculated) : 66.2  Temp (24hrs), Avg:98.4 F (36.9 C), Min:98 F (36.7 C), Max:98.7 F (37.1 C)  Recent Labs  Lab 05/30/19 1816 05/31/19 0355 06/01/19 0448 06/02/19 0347  WBC 9.1 9.0 7.9 6.8  CREATININE 8.13* 8.08* 8.63* 5.93*    Estimated Creatinine Clearance: 14.9 mL/min (A) (by C-G formula based on SCr of 5.93 mg/dL (H)).    Allergies  Allergen Reactions  . Ibuprofen Other (See Comments)    CKD stage 3. Should avoid.  . Dilaudid [Hydromorphone Hcl] Itching and Other (See Comments)    Can take with Benadryl.  . Tramadol Itching     Lindell Spar, PharmD, BCPS Clinical Pharmacist [phone] 06/02/2019 12:55 PM

## 2019-06-02 NOTE — Progress Notes (Signed)
Patient ID: Heather [name], female   DOB: 09/16/1971, 48 y.o.   MRN: [id number] [facility] Progress Note   Assessment/ Plan:   1.  Peritonitis in patient on peritoneal dialysis: Dialysate fluid drained from abdomen.  Continue antibiotics intravenously with hemodialysis for peritonitis and will have PD catheter removed as an outpatient by Dr. Raul Del. 2. ESRD: She has now transitioned modality to hemodialysis from peritoneal dialysis following peritonitis/other problems and is status post right IJ [facility] with plans for her second hemodialysis treatment tomorrow.  She has been accepted at [facility] kidney center and can start there on Tuesday, 06/05/2019.  [name] stable from a renal standpoint to discharge home after dialysis today if no problems emerge. 3. Anemia: Low hemoglobin/hematocrit likely arising from peritonitis/ESA resistance.  Continue antimicrobial therapy and ESA. 4. CKD-MBD: Calcium and phosphorus levels currently acceptable, continue renal diet.  Not on binder.  5. Nutrition: Continue renal diet with renal MVI. 6. Hypertension: Pressure mildly elevated, monitor with hemodialysis/ultrafiltration  Subjective:   Underwent hemodialysis yesterday with treatment cut short by 15 minutes due to filter thrombus (heparin free with earlier [facility] placement).  She complains of a mild tummy ache this morning.   Objective:   BP (!) 146/84 (BP Location: Right Arm)   Pulse 88   Temp 98 F (36.7 C) (Oral)   Resp 18   Ht 5\' 9"  (1.753 m)   Wt 106.4 kg   SpO2 99%   BMI 34.64 kg/m   Physical Exam: Gen: Comfortably resting in bed CVS: Pulse regular rhythm, normal rate, S1 and S2 normal Resp: Clear to auscultation bilaterally.  Right IJ TDC. Abd: Soft, mild to moderately distended with epigastric tenderness.  No guarding. Ext: Race lower extremity edema.  Labs: BMET Recent Labs  Lab 05/30/19 1816 05/31/19 0355 05/31/19 0519 06/01/19 0448 06/02/19 0347  NA 133* 133*  --   136 136  K 3.7 3.8  --  3.5 3.0*  CL 96* 98  --  100 101  CO2 21* 20*  --  24 25  GLUCOSE 393* 314*  --  171* 189*  BUN 37* 37*  --  39* 22*  CREATININE 8.13* 8.08*  --  8.63* 5.93*  CALCIUM 7.6* 7.3*  --  7.2* 7.5*  PHOS  --   --  4.9*  --   --    CBC Recent Labs  Lab 05/30/19 1816 05/31/19 0355 06/01/19 0448 06/02/19 0347  WBC 9.1 9.0 7.9 6.8  NEUTROABS  --  6.4  --   --   HGB 10.2* 9.3* 8.3* 7.9*  HCT 34.8* 31.2* 27.2* 25.5*  MCV 100.0 98.7 95.1 94.8  PLT 322 305 302 213   Medications:    . bisacodyl  5 mg Oral Once  . Chlorhexidine Gluconate Cloth  6 each Topical Q0600  . fentaNYL (SUBLIMAZE) injection  100 mcg Intravenous Once  . hydrALAZINE  10 mg Intravenous Once  . insulin aspart  0-9 Units Subcutaneous Q4H  . insulin glargine  15 Units Subcutaneous QHS  . lactulose  20 g Oral BID  . multivitamin  1 tablet Oral QHS  . nystatin  500,000 Units Oral Q8H  . sodium chloride flush  3 mL Intravenous Once  . sodium chloride flush  3 mL Intravenous Q12H  . vancomycin variable dose per unstable renal function (pharmacist dosing)   Does not apply See admin instructions   Elmarie Shiley, MD 06/02/2019, 10:20 AM

## 2019-06-02 NOTE — Progress Notes (Signed)
Pt back from dialysis. Pt discharging home per MD orders. D/C IV site to LFA. Discharge instructions given and pt understood with no questions asked. Husband waiting downstairs for transport via private car.

## 2019-06-02 NOTE — Discharge Summary (Addendum)
Physician Discharge Summary  Heather [name]:[id number] DOB: 1971-04-23 DOA: 05/30/2019  PCP: Briscoe Deutscher, DO  Admit date: 05/30/2019 Discharge date: 06/02/2019  Admitted From: home Discharge disposition: home   Recommendations for Outpatient Follow-Up:   Continue abx with HD  PD catheter removed as an outpatient by Dr. Raul Del.    Discharge Diagnosis:   Principal Problem:   PD catheter dysfunction ([place]) Active Problems:   Secondary hyperparathyroidism ([place])   Uncontrolled type 2 diabetes mellitus with diabetic polyneuropathy, with long-term current use of insulin ([facility])   Grade I diastolic dysfunction   Peritonitis, dialysis-associated, initial encounter ([place])   Renovascular hypertension   Anemia of chronic disease   ESRD on peritoneal dialysis [facility])    Discharge Condition: Improved.  Diet recommendation: renal/carb mod  Wound care: None.  Code status: Full.   History of Present Illness:   Heather [name] is a 48 y.o. female with medical history significant for hypertension, history of CVA with residual lower extremity weakness, fibromyalgia, insulin-dependent diabetes mellitus, and ESRD on peritoneal dialysis, now presenting to the emergency department for evaluation of abdominal pain and inability to drain her PD catheter.  Patient reports that she instilled dialysate at approximately 10 PM the night of 05/30/2019, developed some lower abdominal discomfort and is now unable to drain the fluid.  She denies any fevers, chills, shortness of breath, chest pain, or palpitations.  Denies any recent lower extremity swelling.  She has not been coughing and denies sick contacts.   Hospital Course by Problem:    Peritonitis in patient on peritoneal dialysis: Dialysate fluid drained from abdomen.  Continue antibiotics intravenously with hemodialysis for peritonitis and will have PD catheter removed as an outpatient by Dr. Raul Del.  ESRD:  -transitioned  modality to hemodialysis from peritoneal dialysis following peritonitis/other problems and is status post right IJ [facility] with plans for her second hemodialysis treatment tomorrow.  She has been accepted at [facility] kidney center and can start there on Tuesday, 06/05/2019.     Anemia: Low hemoglobin/hematocrit likely arising from peritonitis/ESA resistance.  Continue antimicrobial therapy and ESA.    Hypertension: Pressure mildly elevated, monitor with hemodialysis/ultrafiltration    Medical Consultants:      Discharge Exam:   Vitals:   06/01/19 2013 06/02/19 0407  BP: (!) 157/91 (!) 146/84  Pulse: 97 88  Resp: 18 18  Temp: 98.4 F (36.9 C) 98 F (36.7 C)  SpO2: 100% 99%   Vitals:   06/01/19 1700 06/01/19 1715 06/01/19 2013 06/02/19 0407  BP: 131/68 123/77 (!) 157/91 (!) 146/84  Pulse: 89 88 97 88  Resp: _0 Temp:  98.7 F (37.1 C) 98.4 F (36.9 C) 98 F (36.7 C)  TempSrc:  Oral Oral Oral  SpO2:  99% 100% 99%  Weight:  106.4 kg    Height:        General exam: Appears calm and comfortable.   The results of significant diagnostics from this hospitalization (including imaging, microbiology, ancillary and laboratory) are listed below for reference.     Procedures and Diagnostic Studies:   Dg Abd 1 View  Result Date: 05/30/2019 CLINICAL DATA:  Check peritoneal dialysis catheter placement EXAM: ABDOMEN - 1 VIEW COMPARISON:  None. FINDINGS: Peritoneal dialysis catheter is noted in the deep pelvis. No acute bony or soft tissue abnormality is noted. Mild retained fecal material is noted consistent with a degree of constipation without obstructive change. IMPRESSION: Peritoneal dialysis catheter appears within  normal limits. Changes of mild constipation. Electronically Signed   By: Mark  Lukens M.D.   On: 05/30/2019 11:40     Labs:   Basic Metabolic Panel: Recent Labs  Lab 05/30/19 1816 05/31/19 0355 05/31/19 0519 06/01/19 0448 06/02/19 0347 06/02/19 1244   NA 133* 133*  --  136 136 134*  K 3.7 3.8  --  3.5 3.0* 3.4*  CL 96* 98  --  100 101 98  CO2 21* 20*  --  24 25 25  GLUCOSE 393* 314*  --  171* 189* 160*  BUN 37* 37*  --  39* 22* 25*  CREATININE 8.13* 8.08*  --  8.63* 5.93* 6.37*  CALCIUM 7.6* 7.3*  --  7.2* 7.5* 7.6*  PHOS  --   --  4.9*  --   --  4.2   GFR Estimated Creatinine Clearance: 13.9 mL/min (A) (by C-G formula based on SCr of 6.37 mg/dL (H)). Liver Function Tests: Recent Labs  Lab 05/30/19 1816 06/02/19 1244  AST 14*  --   ALT 14  --   ALKPHOS 140*  --   BILITOT 0.8  --   PROT 7.7  --   ALBUMIN 2.6* 2.0*   Recent Labs  Lab 05/30/19 1816  LIPASE 65*   No results for input(s): AMMONIA in the last 168 hours. Coagulation profile Recent Labs  Lab 05/31/19 0519  INR 1.1    CBC: Recent Labs  Lab 05/30/19 1816 05/31/19 0355 06/01/19 0448 06/02/19 0347 06/02/19 1244  WBC 9.1 9.0 7.9 6.8 7.7  NEUTROABS  --  6.4  --   --   --   HGB 10.2* 9.3* 8.3* 7.9* 8.1*  HCT 34.8* 31.2* 27.2* 25.5* 26.5*  MCV 100.0 98.7 95.1 94.8 94.3  PLT 322 305 302 213 231   Cardiac Enzymes: No results for input(s): CKTOTAL, CKMB, CKMBINDEX, TROPONINI in the last 168 hours. BNP: Invalid input(s): POCBNP CBG: Recent Labs  Lab 06/01/19 2008 06/02/19 0016 06/02/19 0402 06/02/19 0739 06/02/19 1201  GLUCAP 188* 197* 160* 158* 135*   D-Dimer No results for input(s): DDIMER in the last 72 hours. Hgb A1c Recent Labs    05/31/19 1046  HGBA1C 9.1*   Lipid Profile No results for input(s): CHOL, HDL, LDLCALC, TRIG, CHOLHDL, LDLDIRECT in the last 72 hours. Thyroid function studies No results for input(s): TSH, T4TOTAL, T3FREE, THYROIDAB in the last 72 hours.  Invalid input(s): FREET3 Anemia work up No results for input(s): VITAMINB12, FOLATE, FERRITIN, TIBC, IRON, RETICCTPCT in the last 72 hours. Microbiology Recent Results (from the past 240 hour(s))  SARS Coronavirus 2 (CEPHEID - Performed in [place] hospital lab),  Hosp Order     Status: None   Collection Time: 05/31/19  1:36 AM  Result Value Ref Range Status   SARS Coronavirus 2 NEGATIVE NEGATIVE Final    Comment: (NOTE) If result is NEGATIVE SARS-CoV-2 target nucleic acids are NOT DETECTED. The SARS-CoV-2 RNA is generally detectable in upper and lower  respiratory specimens during the acute phase of infection. The lowest  concentration of SARS-CoV-2 viral copies this assay can detect is 250  copies / mL. A negative result does not preclude SARS-CoV-2 infection  and should not be used as the sole basis for treatment or other  patient management decisions.  A negative result may occur with  improper specimen collection / handling, submission of specimen other  than nasopharyngeal swab, presence of viral mutation(s) within the  areas targeted by this assay, and inadequate number   of viral copies  (<250 copies / mL). A negative result must be combined with clinical  observations, patient history, and epidemiological information. If result is POSITIVE SARS-CoV-2 target nucleic acids are DETECTED. The SARS-CoV-2 RNA is generally detectable in upper and lower  respiratory specimens dur ing the acute phase of infection.  Positive  results are indicative of active infection with SARS-CoV-2.  Clinical  correlation with patient history and other diagnostic information is  necessary to determine patient infection status.  Positive results do  not rule out bacterial infection or co-infection with other viruses. If result is PRESUMPTIVE POSTIVE SARS-CoV-2 nucleic acids MAY BE PRESENT.   A presumptive positive result was obtained on the submitted specimen  and confirmed on repeat testing.  While 2019 novel coronavirus  (SARS-CoV-2) nucleic acids may be present in the submitted sample  additional confirmatory testing may be necessary for epidemiological  and / or clinical management purposes  to differentiate between  SARS-CoV-2 and other Sarbecovirus  currently known to infect humans.  If clinically indicated additional testing with an alternate test  methodology ([id number]) is advised. The SARS-CoV-2 RNA is generally  detectable in upper and lower respiratory sp ecimens during the acute  phase of infection. The expected result is Negative. Fact Sheet for Patients:  https://www.fda.gov/media/136312/download Fact Sheet for Healthcare Providers: https://www.fda.gov/media/136313/download This test is not yet approved or cleared by the [place] FDA and has been authorized for detection and/or diagnosis of SARS-CoV-2 by FDA under an Emergency Use Authorization (EUA).  This EUA will remain in effect (meaning this test can be used) for the duration of the COVID-19 declaration under Section 564(b)(1) of the Act, 21 U.S.C. section 360bbb-3(b)(1), unless the authorization is terminated or revoked sooner. Performed at [facility], [address] [place], [place] [address]   Blood culture (routine x 2)     Status: None (Preliminary result)   Collection Time: 05/31/19  2:45 AM  Result Value Ref Range Status   Specimen Description BLOOD RIGHT ARM  Final   Special Requests   Final    BOTTLES DRAWN AEROBIC AND ANAEROBIC Blood Culture results may not be optimal due to an excessive volume of blood received in culture bottles   Culture   Final    NO GROWTH 2 DAYS Performed at [facility], [address] [place], [place] [address]    Report Status PENDING  Incomplete  Blood culture (routine x 2)     Status: None (Preliminary result)   Collection Time: 05/31/19  4:57 AM  Result Value Ref Range Status   Specimen Description BLOOD RIGHT HAND  Final   Special Requests   Final    BOTTLES DRAWN AEROBIC ONLY Blood Culture results may not be optimal due to an inadequate volume of blood received in culture bottles   Culture   Final    NO GROWTH 2 DAYS Performed at [facility], [address] , [place] [address]     Report Status PENDING  Incomplete  Body fluid culture     Status: None (Preliminary result)   Collection Time: 05/31/19  5:40 AM  Result Value Ref Range Status   Specimen Description PERITONEAL DIALYSATE  Final   Special Requests Normal  Final   Gram Stain   Final    CYTOSPIN SMEAR WBC PRESENT, PREDOMINANTLY PMN NO ORGANISMS SEEN    Culture   Final    NO GROWTH 2 DAYS Performed at [facility], [address] [place], [place] [address]    Report Status PENDING  Incomplete     Discharge Instructions:   Discharge Instructions    Discharge instructions   Complete by:  As directed    Renal carb mod diet  antibiotics intravenously with hemodialysis for peritonitis and will have PD catheter removed as an outpatient by Dr. Raul Del.   Increase activity slowly   Complete by:  As directed      Allergies as of 06/02/2019      Reactions   Ibuprofen Other (See Comments)   CKD stage 3. Should avoid.   Dilaudid [hydromorphone Hcl] Itching, Other (See Comments)   Can take with Benadryl.   Tramadol Itching      Medication List    STOP taking these medications   potassium chloride 10 MEQ tablet Commonly known as:  K-DUR     TAKE these medications   blood glucose meter kit and supplies Kit Dispense based on patient and insurance preference. Use up to four times daily as directed. (FOR ICD-9 250.00, 250.01).   calcitRIOL 0.5 MCG capsule Commonly known as:  ROCALTROL Take 0.5 mcg by mouth daily.   FreeStyle Libre 14 Day Sensor Misc 1 patch by Does not apply route every 14 (fourteen) days.   gabapentin 100 MG capsule Commonly known as:  NEURONTIN Take 3 capsules (300 mg total) by mouth as needed (pain).   lubiprostone 24 MCG capsule Commonly known as:  Amitiza Take 1 capsule (24 mcg total) by mouth 2 (two) times daily with a meal. What changed:    when to take this  reasons to take this   midodrine 5 MG tablet Commonly known as:  PROAMATINE Take 1 tablet (5 mg  total) by mouth 2 (two) times daily with a meal. What changed:    when to take this  reasons to take this   NovoLOG FlexPen 100 UNIT/ML FlexPen Generic drug:  insulin aspart Inject 10-20 Units into the skin 3 (three) times daily with meals. Sliding scale   oxyCODONE 5 MG immediate release tablet Commonly known as:  Oxy IR/ROXICODONE Take 1 tablet (5 mg total) by mouth every 4 (four) hours as needed for severe pain.   [name] FlexTouch 100 UNIT/ML Sopn FlexTouch Pen Generic drug:  insulin degludec Inject 28 Units into the skin daily.         Time coordinating discharge: 35 min  Signed:  Geradine Girt DO  [facility] 06/02/2019, 2:22 PM

## 2019-06-03 LAB — BODY FLUID CULTURE
Culture: NO GROWTH
Special Requests: NORMAL

## 2019-06-03 LAB — PATHOLOGIST SMEAR REVIEW

## 2019-06-04 ENCOUNTER — Other Ambulatory Visit (HOSPITAL_COMMUNITY)
Admission: RE | Admit: 2019-06-04 | Discharge: 2019-06-04 | Disposition: A | Discharge status: Home / Self Care | Payer: Managed Care, Other (non HMO) | Source: Ambulatory Visit | Attending: Physician Assistant | Admitting: Physician Assistant

## 2019-06-04 ENCOUNTER — Encounter: Payer: Self-pay | Admitting: Physician Assistant

## 2019-06-04 ENCOUNTER — Other Ambulatory Visit: Payer: Self-pay

## 2019-06-04 ENCOUNTER — Telehealth: Payer: Self-pay | Admitting: Family Medicine

## 2019-06-04 ENCOUNTER — Ambulatory Visit (INDEPENDENT_AMBULATORY_CARE_PROVIDER_SITE_OTHER): Payer: Managed Care, Other (non HMO) | Admitting: Physician Assistant

## 2019-06-04 VITALS — BP 160/95 | HR 98 | Temp 98.4°F | Ht 69.0 in | Wt 235.0 lb

## 2019-06-04 DIAGNOSIS — R102 Pelvic and perineal pain: Secondary | ICD-10-CM

## 2019-06-04 DIAGNOSIS — N186 End stage renal disease: Secondary | ICD-10-CM

## 2019-06-04 DIAGNOSIS — E876 Hypokalemia: Secondary | ICD-10-CM | POA: Insufficient documentation

## 2019-06-04 DIAGNOSIS — D689 Coagulation defect, unspecified: Secondary | ICD-10-CM | POA: Insufficient documentation

## 2019-06-04 MED ORDER — OXYCODONE-ACETAMINOPHEN 5-325 MG PO TABS: 1.0000 | ORAL_TABLET | Freq: Three times a day (TID) | ORAL | 0 refills | Status: DC | PRN

## 2019-06-04 NOTE — Progress Notes (Signed)
Heather [name] is a 48 y.o. female here for a follow up of a pre-existing problem.  I acted as a Education administrator for [employer], PA-C Anselmo Pickler, LPN  History of Present Illness:   Chief Complaint  Patient presents with  . low pelvic pain    HPI   Patient recently discharged from hospital on 06/02/19 for peritonitis. Plan is for patient to start HD tomorrow. Peritoneal catheter to be removed as an outpatient.  She states she c/o midline low pelvic pain x 2 weeks. Does not radiate. Denies back pain, no nausea. She saw her kidney provider and he told her to see GYN. Pt has irregular menstrual cycles, once every 60 days. Period last for about 3 days and is heavy. Currently not sexually active due to pain. Pt has not taking anything for the pain.  She has had some vaginal discharge that she thought was a yeast infection -- treated while she was in the hospital with diflucan per her report. She does endorse urinary frequency.  Denies: fevers, chills, lower back pain  Past Medical History:  Diagnosis Date  . Antiplatelet or antithrombotic long-term use: Plavix 06/30/2017  . B12 deficiency 05/10/2014  . Blood transfusion without reported diagnosis   . Chronic constipation   . CVA (cerebral vascular accident) ([place]), nonhemorrhagic, inferior right cerebellum 12/03/2017  . Cyst of left ovary 01/12/2018  . Diabetic neuropathy, painful ([place]), on low dose Gabapentin 07/13/2013  . DM (diabetes mellitus), type 2, uncontrolled, with renal complications ([place]) 14/43/1540  . DM2 (diabetes mellitus, type 2) ([place])   . Dysfunction of left eustachian tube, with pusatile tinnitus 11/24/2017  . Dyslipidemia associated with type 2 diabetes mellitus ([place]) 06/25/2016  . ESRD (end stage renal disease) on dialysis [facility])    On hemodialysis in July 2019 via [facility] then switched to [facility] in Nov 2019.   [name] ESRD with anemia ([place])   . Fibromyalgia   . Gastroparesis due to DM   . GERD (gastroesophageal reflux disease)    . Hypertension associated with diabetes ([place]) 02/19/2011  . IBS (irritable bowel syndrome)   . Left-sided weakness 12/10/2017   .  [name] Leiomyoma of uterus   . Pancreatitis   . Proliferative diabetic retinopathy ([place]) 09/29/2015  . 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 Reactive depression 12/10/2017   .  [name] Right-sided low back pain without sciatica 12/08/2015  . Secondary hyperthyroidism 11/27/2013  . Steal syndrome dialysis vascular access ([place]) 02/17/2018  . Thromboembolism ([place]) 04/05/2018  . Upper airway cough syndrome, with recs to stay off ACE and take Pepcid q hs 11/15/2016     Social History   Socioeconomic History  . Marital status: Married    Spouse name: Not on file  . Number of children: 1  . Years of education: college  . Highest education level: Not on file  Occupational History  . Occupation: Armed forces technical officer: [employer]  Social Needs  . Financial resource strain: Not on file  . Food insecurity:    Worry: Not on file    Inability: Not on file  . Transportation needs:    Medical: Not on file    Non-medical: Not on file  Tobacco Use  . Smoking status: Never Smoker  . Smokeless tobacco: Never Used  Substance and Sexual Activity  . Alcohol use: No    Alcohol/week: 0.0 standard drinks  . Drug use: No  . Sexual activity: Yes    Partners: Male  Birth control/protection: None  Lifestyle  . Physical activity:    Days per week: Not on file    Minutes per session: Not on file  . Stress: Not on file  Relationships  . Social connections:    Talks on phone: Not on file    Gets together: Not on file    Attends religious service: Not on file    Active member of club or organization: Not on file    Attends meetings of clubs or organizations: Not on file    Relationship status: Not on file  . Intimate partner violence:    Fear of current or ex partner: Not on file    Emotionally abused: Not on file    Physically abused: Not on  file    Forced sexual activity: Not on file  Other Topics Concern  . Not on file  Social History Narrative   Patient lives with fiance. She has 1 grown son. Works full-time, she Paramedic for attorney.   Caffeine Use: 2 cups daily; sodas occasionally   She has 7 grandchildren    Past Surgical History:  Procedure Laterality Date  . ARTERY REPAIR Right 02/17/2018   Procedure: EXPLORATION OF RIGHT BRACHIAL ARTERY;  Surgeon: Conrad Ferrysburg, MD;  Location: [place];  Service: Vascular;  Laterality: Right;  . ARTERY REPAIR Right 02/17/2018   Procedure: BRACHIAL ARTERY EXPLORATION AND TRHOMBECTOMY;  Surgeon: Conrad [place], MD;  Location: [facility] OR;  Service: Vascular;  Laterality: Right;  . AV FISTULA PLACEMENT Left 05/09/2017   Procedure: INSERTION OF ARTERIOVENOUS (AV) GRAFT ARM (ARTEGRAFT);  Surgeon: Conrad Vermilion, MD;  Location: [facility] OR;  Service: Vascular;  Laterality: Left;  . AV FISTULA PLACEMENT Right 02/17/2018   Procedure: INSERTION OF ARTERIOVENOUS (AV) GORE-TEX GRAFT ARM RIGHT UPPER ARM;  Surgeon: Conrad Y-O Ranch, MD;  Location: [place];  Service: Vascular;  Laterality: Right;  . BASCILIC VEIN TRANSPOSITION Left 08/31/2016   Procedure: BASILIC VEIN TRANSPOSITION FIRST STAGE;  Surgeon: Conrad [place], MD;  Location: [place];  Service: Vascular;  Laterality: Left;  . CENTRAL VENOUS CATHETER INSERTION Left 07/24/2018   Procedure: INSERTION CENTRAL LINE ADULT;  Surgeon: Conrad [place], MD;  Location: [place];  Service: Vascular;  Laterality: Left;  . EYE SURGERY     secondary to diabetic retinopathy   . FOOT SURGERY Right    t"ook bone out- maybe hammer toe"  . INSERTION OF DIALYSIS CATHETER Left 07/24/2018   Procedure: INSERTION OF TUNNELED DIALYSIS CATHETER;  Surgeon: Conrad Posen, MD;  Location: [place];  Service: Vascular;  Laterality: Left;  . IR FLUORO GUIDE CV LINE RIGHT  07/21/2018  . IR FLUORO GUIDE CV LINE RIGHT  06/01/2019  . IR US GUIDE VASC ACCESS RIGHT  07/21/2018  . IR US GUIDE VASC ACCESS RIGHT   06/01/2019  . LIGATION OF ARTERIOVENOUS  FISTULA Left 05/09/2017   Procedure: LIGATION OF ARTERIOVENOUS  FISTULA;  Surgeon: Conrad Chimayo, MD;  Location: [place];  Service: Vascular;  Laterality: Left;  . LOOP RECORDER INSERTION N/A 12/05/2017   Procedure: LOOP RECORDER INSERTION;  Surgeon: Evans Lance, MD;  Location: [place] CV LAB;  Service: Cardiovascular;  Laterality: N/A;  . MYOMECTOMY    . REMOVAL OF GRAFT Right 02/17/2018   Procedure: REMOVAL OF RIGHT UPPER ARM ARTERIOVENOUS GRAFT;  Surgeon: Conrad , MD;  Location: [place];  Service: Vascular;  Laterality: Right;  . REVISION OF ARTERIOVENOUS GORETEX GRAFT Left 07/24/2018  Procedure: REDO ARTERIOVENOUS GORETEX GRAFT;  Surgeon: Conrad [place], MD;  Location: [place];  Service: Vascular;  Laterality: Left;  . TEE WITHOUT CARDIOVERSION N/A 12/05/2017   Procedure: TRANSESOPHAGEAL ECHOCARDIOGRAM (TEE);  Surgeon: Sanda Klein, MD;  Location: [facility] ENDOSCOPY;  Service: Cardiovascular;  Laterality: N/A;  . UPPER EXTREMITY VENOGRAPHY Left 07/13/2018   Procedure: UPPER EXTREMITY VENOGRAPHY - Central & Left Arm;  Surgeon: Conrad Guyton, MD;  Location: [place] CV LAB;  Service: Cardiovascular;  Laterality: Left;  . UTERINE FIBROID SURGERY    . VITRECTOMY Bilateral     Family History  Problem Relation Age of Onset  . Colon polyps Mother   . Diabetes Mother   . Heart murmur Father        history essentially unknown  . Hypertension Father   . Diabetes Sister   . Kidney failure Sister        kidney transplant  . Diabetes Brother   . Retinal degeneration Brother   . Heart murmur Son   . Stroke Paternal Grandmother   . Diabetes Sister     Allergies  Allergen Reactions  . Ibuprofen Other (See Comments)    CKD stage 3. Should avoid.  . Dilaudid [Hydromorphone Hcl] Itching and Other (See Comments)    Can take with Benadryl.  . Tramadol Itching    Current Medications:   Current Outpatient Medications:  .  blood glucose meter kit  and supplies KIT, Dispense based on patient and insurance preference. Use up to four times daily as directed. (FOR ICD-9 250.00, 250.01)., Disp: 1 each, Rfl: 0 .  calcitRIOL (ROCALTROL) 0.5 MCG capsule, Take 0.5 mcg by mouth daily., Disp: , Rfl:  .  Continuous Blood Gluc Sensor (FREESTYLE LIBRE 14 DAY SENSOR) MISC, 1 patch by Does not apply route every 14 (fourteen) days., Disp: 6 each, Rfl: 1 .  gabapentin (NEURONTIN) 100 MG capsule, Take 3 capsules (300 mg total) by mouth as needed (pain)., Disp: , Rfl:  .  insulin aspart (NOVOLOG FLEXPEN) 100 UNIT/ML FlexPen, Inject 10-20 Units into the skin 3 (three) times daily with meals. Sliding scale, Disp: , Rfl:  .  insulin degludec (TRESIBA FLEXTOUCH) 100 UNIT/ML SOPN FlexTouch Pen, Inject 28 Units into the skin daily. , Disp: , Rfl:  .  lubiprostone (AMITIZA) 24 MCG capsule, Take 1 capsule (24 mcg total) by mouth 2 (two) times daily with a meal. (Patient taking differently: Take 24 mcg by mouth as needed for constipation. ), Disp: , Rfl:  .  midodrine (PROAMATINE) 5 MG tablet, Take 1 tablet (5 mg total) by mouth 2 (two) times daily with a meal. (Patient taking differently: Take 5 mg by mouth as needed (Low blood pressure). ), Disp: 180 tablet, Rfl: 0 .  oxyCODONE (OXY IR/ROXICODONE) 5 MG immediate release tablet, Take 1 tablet (5 mg total) by mouth every 4 (four) hours as needed for severe pain. (Patient not taking: Reported on 06/04/2019), Disp: 10 tablet, Rfl: 0 .  oxyCODONE-acetaminophen (PERCOCET/ROXICET) 5-325 MG tablet, Take 1 tablet by mouth every 8 (eight) hours as needed for severe pain., Disp: 20 tablet, Rfl: 0   Review of Systems:   Review of Systems  Constitutional: Negative for chills, fever, malaise/fatigue and weight loss.  Respiratory: Negative for shortness of breath.   Cardiovascular: Negative for chest pain, orthopnea, claudication and leg swelling.  Gastrointestinal: Negative for heartburn, nausea and vomiting.  Genitourinary: Positive  for frequency. Negative for flank pain and hematuria.  Neurological: Negative for dizziness, tingling and  headaches.    Vitals:   Vitals:   06/04/19 1430  BP: (!) 160/95  Pulse: 98  Temp: 98.4 F (36.9 C)  TempSrc: Oral  SpO2: 99%  Weight: 235 lb (106.6 kg)  Height: _0  (1.753 m)     Body mass index is 34.7 kg/m.  Physical Exam:   Physical Exam Vitals signs and nursing note reviewed. Exam conducted with a chaperone present.  Constitutional:      General: [name] not in acute distress.    Appearance: [name] well-developed. [name] not ill-appearing or toxic-appearing.  Cardiovascular:     Rate and Rhythm: Normal rate and regular rhythm.     Pulses: Normal pulses.     Heart sounds: Normal heart sounds, S1 normal and S2 normal.     Comments: No LE edema Pulmonary:     Effort: Pulmonary effort is normal.     Breath sounds: Normal breath sounds.  Abdominal:     General: Abdomen is flat. Bowel sounds are normal.     Palpations: Abdomen is soft.     Tenderness: There is abdominal tenderness in the suprapubic area.     Comments: Pain with palpation to suprapubic region  Genitourinary:    Pubic Area: No rash.      Labia:        Right: No rash or tenderness.        Left: No rash or tenderness.      Vagina: Normal.     Cervix: No cervical motion tenderness.     Uterus: Normal.   Skin:    General: Skin is warm and dry.  Neurological:     Mental Status: [name] alert.     GCS: GCS eye subscore is 4. GCS verbal subscore is 5. GCS motor subscore is 6.  Psychiatric:        Speech: Speech normal.        Behavior: Behavior normal. Behavior is cooperative.     Assessment and Plan:   Tata was seen today for low pelvic pain.  Diagnoses and all orders for this visit:  Pelvic pain Unclear etiology. No red flags on exam. Cervical swab obtained. Percocet #20 prescribed today. Referral to ob-gyn for further evaluation and treatment. Low threshold for ER if any worsening  symptoms or changes. -     Cervicovaginal ancillary only -     Ambulatory referral to Obstetrics / Gynecology  Other orders -     oxyCODONE-acetaminophen (PERCOCET/ROXICET) 5-325 MG tablet; Take 1 tablet by mouth every 8 (eight) hours as needed for severe pain.  . Reviewed expectations re: course of current medical issues. . Discussed self-management of symptoms. . Outlined signs and symptoms indicating need for more acute intervention. . Patient verbalized understanding and all questions were answered. . See orders for this visit as documented in the electronic medical record. . Patient received an After-Visit Summary.  CMA or LPN served as scribe during this visit. History, Physical, and Plan performed by medical provider. The above documentation has been reviewed and is accurate and complete.  Inda Coke, PA-C

## 2019-06-04 NOTE — Telephone Encounter (Signed)
Called patient put on with you today

## 2019-06-04 NOTE — Telephone Encounter (Signed)
Did not see telephone note....does pt need triaged?  Copied from [place] [phone]. Topic: Quick Communication - See Telephone Encounter >> Jun 04, 2019 11:29 AM Blase Mess A wrote: CRM for notification. See Telephone encounter for: 06/04/19.  Patient is calling to schedule virtual appt with Dr. Juleen China for Kidney Pain Thank [phone] Jerilynn Mages)

## 2019-06-04 NOTE — Patient Instructions (Addendum)
It was great to see you!  We will be in touch as soon as your swabs return.  Do not take the pain medication more frequently than prescribed.  If you have any worsening symptoms in the meantime, please go to the ER.  Someone will be in touch with getting you to GYN soon.  Take care,  Inda Coke PA-C

## 2019-06-05 LAB — CULTURE, BLOOD (ROUTINE X 2)
Culture: NO GROWTH
Culture: NO GROWTH

## 2019-06-06 ENCOUNTER — Other Ambulatory Visit: Payer: Self-pay

## 2019-06-06 LAB — CERVICOVAGINAL ANCILLARY ONLY
Bacterial vaginitis: POSITIVE — AB
Candida vaginitis: NEGATIVE
Chlamydia: NEGATIVE
Neisseria Gonorrhea: NEGATIVE
Trichomonas: NEGATIVE

## 2019-06-07 ENCOUNTER — Other Ambulatory Visit: Payer: Self-pay | Admitting: Physician Assistant

## 2019-06-07 ENCOUNTER — Ambulatory Visit: Payer: Managed Care, Other (non HMO) | Admitting: Obstetrics and Gynecology

## 2019-06-07 ENCOUNTER — Other Ambulatory Visit: Payer: Self-pay

## 2019-06-07 MED ORDER — METRONIDAZOLE 500 MG PO TABS: 500.0000 mg | ORAL_TABLET | Freq: Two times a day (BID) | ORAL | 0 refills | Status: DC

## 2019-06-11 ENCOUNTER — Telehealth: Payer: Self-pay | Admitting: *Deleted

## 2019-06-11 ENCOUNTER — Ambulatory Visit (INDEPENDENT_AMBULATORY_CARE_PROVIDER_SITE_OTHER): Payer: Managed Care, Other (non HMO) | Admitting: Obstetrics and Gynecology

## 2019-06-11 ENCOUNTER — Encounter: Payer: Self-pay | Admitting: Obstetrics and Gynecology

## 2019-06-11 ENCOUNTER — Other Ambulatory Visit: Payer: Self-pay

## 2019-06-11 VITALS — BP 146/90 | HR 88 | Temp 97.4°F | Ht 69.0 in | Wt 229.6 lb

## 2019-06-11 DIAGNOSIS — N921 Excessive and frequent menstruation with irregular cycle: Secondary | ICD-10-CM | POA: Diagnosis not present

## 2019-06-11 DIAGNOSIS — Z862 Personal history of diseases of the blood and blood-forming organs and certain disorders involving the immune mechanism: Secondary | ICD-10-CM | POA: Diagnosis not present

## 2019-06-11 DIAGNOSIS — R3989 Other symptoms and signs involving the genitourinary system: Secondary | ICD-10-CM

## 2019-06-11 DIAGNOSIS — N914 Secondary oligomenorrhea: Secondary | ICD-10-CM

## 2019-06-11 DIAGNOSIS — R102 Pelvic and perineal pain: Secondary | ICD-10-CM | POA: Diagnosis not present

## 2019-06-11 DIAGNOSIS — Z113 Encounter for screening for infections with a predominantly sexual mode of transmission: Secondary | ICD-10-CM

## 2019-06-11 DIAGNOSIS — N946 Dysmenorrhea, unspecified: Secondary | ICD-10-CM

## 2019-06-11 LAB — POCT URINALYSIS DIPSTICK
Bilirubin, UA: NEGATIVE
Blood, UA: POSITIVE
Glucose, UA: POSITIVE — AB
Ketones, UA: NEGATIVE
Leukocytes, UA: NEGATIVE
Nitrite, UA: NEGATIVE
Protein, UA: POSITIVE — AB
Spec Grav, UA: 1.01 (ref 1.010–1.025)
Urobilinogen, UA: 0.2 E.U./dL
pH, UA: 5 (ref 5.0–8.0)

## 2019-06-11 LAB — POCT URINE PREGNANCY: Preg Test, Ur: NEGATIVE

## 2019-06-11 MED ORDER — MEDROXYPROGESTERONE ACETATE 5 MG PO TABS: 5.0000 mg | ORAL_TABLET | Freq: Every day | ORAL | 0 refills | Status: DC

## 2019-06-11 NOTE — Progress Notes (Signed)
48 y.o. G52P0011 Married Black or [place] American Not Hispanic or Latino female here for a consultation from Dr Juleen China for pelvic pain and vaginal discharge that began when she started dialysis at home during [place] [facility].   She c/o a 4 month h/o intermittent supra pubic pain. The pain is a severe cramping type pain. Worse than menstrual cramps, in the same area. The pain is up to a 10/10 in severity. Lasts for days. In the past when she has been treated for vaginitis her symptoms have improved (flagyl helped but caused GI upset). Currently her pain is a 2/10 in severity. Having a BM may help the pain some. She has constipation and only has ~2 BM's a week.  She was having a vaginal d/c when she was doing at home dialysis, better in the last week. Not currently having a discharge. No vaginal itching, burning, or irritation. No odor. Sexually active, not in the last few month. In the past she had intermittent deep dyspareunia.   In 2/20 she had a left hemorrhagic ovarian cyst on CT.  U/S 2/20: IMPRESSION: 2.8 cm mildly complex left ovarian cyst is noted concerning for hemorrhagic cyst. Short-interval follow up ultrasound in 6-12 weeks is recommended, preferably during the week following the patient's normal menses.  Small uterine fibroids are noted as well.    Cycles have been ~q 3 months in the last year. Prior to that they were monthly.   Period Duration (Days): 3-7 days Period Pattern: (!) Irregular Menstrual Flow: Heavy Menstrual Control: Tampon, Maxi pad, Thin pad Menstrual Control Change Freq (Hours): changes pad/tampon every hour Dysmenorrhea: (!) Severe Dysmenorrhea Symptoms: Cramping  She can saturate an ultra tampon in up to one hour.   Last HgbA1C was 9.1% on 05/31/19 Normal TSH in 1.11  She has some hot flashes, some night sweats. Tolerable. Some vaginal dryness.  Sexually active, no contraception.  Patient's last menstrual period was 05/01/2019 (exact date).           Sexually active: Yes.    The current method of family planning is none.    Exercising: No.  The patient does not participate in regular exercise at present. Smoker:  no  Health Maintenance: Pap:  07/29/2017 WNL NEG HPV History of abnormal Pap:  no MMG:  06/01/2016 Birads 1 negative, she is getting it yearly at work on a mobile unit.  BMD:   Unsure Colonoscopy: 05/19/2018 polyps, repeat in 5 years TDaP:  09/20/2013 Gardasil: No   reports that she has never smoked. She has never used smokeless tobacco. She reports that she does not drink alcohol or use drugs. Working from home, works for [employer]. 72 year old son, 7 grandchildren 70 boys, 1 girl.   Past Medical History:  Diagnosis Date  . Antiplatelet or antithrombotic long-term use: Plavix 06/30/2017  . B12 deficiency 05/10/2014  . Blood transfusion without reported diagnosis   . Chronic constipation   . CVA (cerebral vascular accident) ([place]), nonhemorrhagic, inferior right cerebellum 12/03/2017  . Cyst of left ovary 01/12/2018  . Diabetic neuropathy, painful ([place]), on low dose Gabapentin 07/13/2013  . DM (diabetes mellitus), type 2, uncontrolled, with renal complications ([place]) 09/98/3382  . DM2 (diabetes mellitus, type 2) ([place])   . Dysfunction of left eustachian tube, with pusatile tinnitus 11/24/2017  . Dyslipidemia associated with type 2 diabetes mellitus ([place]) 06/25/2016  . ESRD (end stage renal disease) on dialysis [facility])    On hemodialysis in July 2019 via [facility] then switched to [facility]  in Nov 2019.   [name] ESRD with anemia ([place])   . Fibromyalgia   . Gastroparesis due to DM   . GERD (gastroesophageal reflux disease)   . Hypertension associated with diabetes ([place]) 02/19/2011  . IBS (irritable bowel syndrome)   . Left-sided weakness 12/10/2017   .  [name] Leiomyoma of uterus   . Pancreatitis   . Proliferative diabetic retinopathy ([place]) 09/29/2015  . 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 Reactive depression 12/10/2017   .  [name]  Right-sided low back pain without sciatica 12/08/2015  . Secondary hyperthyroidism 11/27/2013  . Steal syndrome dialysis vascular access ([place]) 02/17/2018  . Thromboembolism ([place]) 04/05/2018  . Upper airway cough syndrome, with recs to stay off ACE and take Pepcid q hs 11/15/2016    Past Surgical History:  Procedure Laterality Date  . ARTERY REPAIR Right 02/17/2018   Procedure: EXPLORATION OF RIGHT BRACHIAL ARTERY;  Surgeon: Conrad [place], MD;  Location: [place];  Service: Vascular;  Laterality: Right;  . ARTERY REPAIR Right 02/17/2018   Procedure: BRACHIAL ARTERY EXPLORATION AND TRHOMBECTOMY;  Surgeon: Conrad Perkins, MD;  Location: [facility] OR;  Service: Vascular;  Laterality: Right;  . AV FISTULA PLACEMENT Left 05/09/2017   Procedure: INSERTION OF ARTERIOVENOUS (AV) GRAFT ARM (ARTEGRAFT);  Surgeon: Conrad Blanding, MD;  Location: [facility] OR;  Service: Vascular;  Laterality: Left;  . AV FISTULA PLACEMENT Right 02/17/2018   Procedure: INSERTION OF ARTERIOVENOUS (AV) GORE-TEX GRAFT ARM RIGHT UPPER ARM;  Surgeon: Conrad [place], MD;  Location: [place];  Service: Vascular;  Laterality: Right;  . BASCILIC VEIN TRANSPOSITION Left 08/31/2016   Procedure: BASILIC VEIN TRANSPOSITION FIRST STAGE;  Surgeon: Conrad Redstone, MD;  Location: [place];  Service: Vascular;  Laterality: Left;  . CENTRAL VENOUS CATHETER INSERTION Left 07/24/2018   Procedure: INSERTION CENTRAL LINE ADULT;  Surgeon: Conrad Ingenio, MD;  Location: [place];  Service: Vascular;  Laterality: Left;  . EYE SURGERY     secondary to diabetic retinopathy   . FOOT SURGERY Right    t"ook bone out- maybe hammer toe"  . INSERTION OF DIALYSIS CATHETER Left 07/24/2018   Procedure: INSERTION OF TUNNELED DIALYSIS CATHETER;  Surgeon: Conrad McIntosh, MD;  Location: [place];  Service: Vascular;  Laterality: Left;  . IR FLUORO GUIDE CV LINE RIGHT  07/21/2018  . IR FLUORO GUIDE CV LINE RIGHT  06/01/2019  . IR US GUIDE VASC ACCESS RIGHT  07/21/2018  . IR US GUIDE VASC ACCESS RIGHT   06/01/2019  . LIGATION OF ARTERIOVENOUS  FISTULA Left 05/09/2017   Procedure: LIGATION OF ARTERIOVENOUS  FISTULA;  Surgeon: Conrad [place], MD;  Location: [place];  Service: Vascular;  Laterality: Left;  . LOOP RECORDER INSERTION N/A 12/05/2017   Procedure: LOOP RECORDER INSERTION;  Surgeon: Evans Lance, MD;  Location: [place] CV LAB;  Service: Cardiovascular;  Laterality: N/A;  . MYOMECTOMY    . REMOVAL OF GRAFT Right 02/17/2018   Procedure: REMOVAL OF RIGHT UPPER ARM ARTERIOVENOUS GRAFT;  Surgeon: Conrad [place], MD;  Location: [place];  Service: Vascular;  Laterality: Right;  . REVISION OF ARTERIOVENOUS GORETEX GRAFT Left 07/24/2018   Procedure: REDO ARTERIOVENOUS GORETEX GRAFT;  Surgeon: Conrad , MD;  Location: [place];  Service: Vascular;  Laterality: Left;  . TEE WITHOUT CARDIOVERSION N/A 12/05/2017   Procedure: TRANSESOPHAGEAL ECHOCARDIOGRAM (TEE);  Surgeon: Sanda Klein, MD;  Location: [place];  Service: Cardiovascular;  Laterality: N/A;  . UPPER EXTREMITY VENOGRAPHY  Left 07/13/2018   Procedure: UPPER EXTREMITY VENOGRAPHY - Central & Left Arm;  Surgeon: Conrad Brunson, MD;  Location: [place] CV LAB;  Service: Cardiovascular;  Laterality: Left;  . UTERINE FIBROID SURGERY    . VITRECTOMY Bilateral   Abdominal myomectomy x 2, years ago.   Current Outpatient Medications  Medication Sig Dispense Refill  . blood glucose meter kit and supplies KIT Dispense based on patient and insurance preference. Use up to four times daily as directed. (FOR ICD-9 250.00, 250.01). 1 each 0  . Continuous Blood Gluc Sensor (FREESTYLE LIBRE 14 DAY SENSOR) MISC 1 patch by Does not apply route every 14 (fourteen) days. 6 each 1  . gabapentin (NEURONTIN) 100 MG capsule Take 3 capsules (300 mg total) by mouth as needed (pain).    . insulin aspart (NOVOLOG FLEXPEN) 100 UNIT/ML FlexPen Inject 10-20 Units into the skin 3 (three) times daily with meals. Sliding scale    . insulin degludec (TRESIBA  FLEXTOUCH) 100 UNIT/ML SOPN FlexTouch Pen Inject 28 Units into the skin daily.     [name] lubiprostone (AMITIZA) 24 MCG capsule Take 1 capsule (24 mcg total) by mouth 2 (two) times daily with a meal. (Patient taking differently: Take 24 mcg by mouth as needed for constipation. )    . midodrine (PROAMATINE) 5 MG tablet Take 1 tablet (5 mg total) by mouth 2 (two) times daily with a meal. (Patient taking differently: Take 5 mg by mouth as needed (Low blood pressure). ) 180 tablet 0  . oxyCODONE (OXY IR/ROXICODONE) 5 MG immediate release tablet Take 1 tablet (5 mg total) by mouth every 4 (four) hours as needed for severe pain. (Patient not taking: Reported on 06/11/2019) 10 tablet 0  . oxyCODONE-acetaminophen (PERCOCET/ROXICET) 5-325 MG tablet Take 1 tablet by mouth every 8 (eight) hours as needed for severe pain. (Patient not taking: Reported on 06/11/2019) 20 tablet 0   No current facility-administered medications for this visit.     Family History  Problem Relation Age of Onset  . Colon polyps Mother   . Diabetes Mother   . Heart murmur Father        history essentially unknown  . Hypertension Father   . Diabetes Sister   . Kidney failure Sister        kidney transplant  . Diabetes Brother   . Retinal degeneration Brother   . Heart murmur Son   . Stroke Paternal Grandmother   . Diabetes Sister     Review of Systems  Constitutional: Negative.   HENT: Negative.   Eyes: Negative.   Respiratory: Negative.   Cardiovascular: Negative.   Gastrointestinal: Negative.   Endocrine: Negative.   Genitourinary: Positive for pelvic pain and vaginal discharge.  Musculoskeletal: Negative.   Skin: Negative.   Allergic/Immunologic: Negative.   Neurological: Negative.   Hematological: Negative.   Psychiatric/Behavioral: Negative.     Exam:   BP (!) 146/90 (BP Location: Left Arm, Patient Position: Sitting, Cuff Size: Large)   Pulse 88   Temp (!) 97.4 F (36.3 C) (Skin)   Ht '5\' 9"'  (1.753 m)   Wt  229 lb 9.6 oz (104.1 kg)   LMP 05/01/2019 (Exact Date)   BMI 33.91 kg/m   Weight change: '@WEIGHTCHANGE' @ Height:   Height: '5\' 9"'  (175.3 cm)  Ht Readings from Last 3 Encounters:  06/11/19 '5\' 9"'  (1.753 m)  06/04/19 '5\' 9"'  (1.753 m)  05/31/19 '5\' 9"'  (1.753 m)    General appearance: alert, cooperative and appears stated  age Head: Normocephalic, without obvious abnormality, atraumatic Neck: no adenopathy, supple, symmetrical, trachea midline and thyroid normal to inspection and palpation Lungs: clear to auscultation bilaterally Cardiovascular: regular rate and rhythm Abdomen: soft, mildly tender in the suprapubic region; non distended,  no masses,  no organomegaly Extremities: extremities normal, atraumatic, no cyanosis or edema Skin: Skin color, texture, turgor normal. No rashes or lesions Lymph nodes: Cervical, supraclavicular, and axillary nodes normal. No abnormal inguinal nodes palpated Neurologic: Grossly normal   Pelvic: External genitalia:  no lesions              Urethra:  normal appearing urethra with no masses, tenderness or lesions              Bartholins and Skenes: normal                 Vagina: normal appearing vagina with normal color and discharge, no lesions              Cervix: no cervical motion tenderness and no lesions               Bimanual Exam:  Uterus:  anteverted, mobile, top normal sized, not tender              Adnexa: no mass, fullness, tenderness                Rectovaginal: Confirms               Anus:  normal sphincter tone, no lesions  Bladder: tender to palpation  Chaperone was present for exam.  A:  Pelvic pain  H/O ovarian cyst  Bladder tender  Menorrhagia with oligomenorrhea, recent normal TSH  Severe dysmenorrhea  Significant anemia, recent hgb was 8.1, this could be from her chronic medical conditions, her heavy bleeding (infrequent) or a combination of both  Multiple medical conditions, including: diabetes (with end organ damage), kidney  failure, hypertension,  H/O CVA.  Prior vaginal discharge has resolved.  P:   UPT negative  Urine for ua, c&s  Ferritin, FSH  Provera 5 mg x 5 days   F/U for ultrasound (due for f/u), possible endometrial biopsy  Screening std  Information on Mirena IUD given (not using contraception and has menorrhagia and severe dysmenorrhea)  We discussed that it would be unlikely for her to get pregnant, but would not be safe for her. She should use some type of contraception.  CC: Dr Juleen China Note sent

## 2019-06-11 NOTE — Telephone Encounter (Signed)
Spoke with Dorice Lamas, Therapist, sports at [facility]. Patient is scheduled for dialysis on 06/12/19. They will collect labs: ferritin, FSH, hep B surface antigen, RPR and hep C antibody, read back and confirmed.  No written order needed, labs should be back on 06/14/19, will fax results to [facility] at [phone].

## 2019-06-11 NOTE — Telephone Encounter (Signed)
Left message to call Sharee Pimple, RN at [facility].    Please confirm where patient goes for dialysis.    Unable to get labs while in office. Patient has a port, is going for dialysis on 6/16, can labs be drawn at that location?  Labs: ferritin, FSH, Hep B surface antigen, hep C antibody, RPR

## 2019-06-12 DIAGNOSIS — E46 Unspecified protein-calorie malnutrition: Secondary | ICD-10-CM | POA: Insufficient documentation

## 2019-06-12 LAB — URINALYSIS, MICROSCOPIC ONLY: Epithelial Cells (non renal): >10 /hpf — AB (ref 0–10)

## 2019-06-13 ENCOUNTER — Other Ambulatory Visit: Payer: Self-pay | Admitting: Obstetrics and Gynecology

## 2019-06-13 ENCOUNTER — Telehealth: Payer: Self-pay | Admitting: Obstetrics and Gynecology

## 2019-06-13 DIAGNOSIS — Z8742 Personal history of other diseases of the female genital tract: Secondary | ICD-10-CM

## 2019-06-13 DIAGNOSIS — R102 Pelvic and perineal pain unspecified side: Secondary | ICD-10-CM

## 2019-06-13 DIAGNOSIS — N921 Excessive and frequent menstruation with irregular cycle: Secondary | ICD-10-CM

## 2019-06-13 DIAGNOSIS — N946 Dysmenorrhea, unspecified: Secondary | ICD-10-CM

## 2019-06-13 LAB — URINE CULTURE: Organism ID, Bacteria: NO GROWTH

## 2019-06-13 NOTE — Telephone Encounter (Signed)
Left message to call Aerionna Moravek at [phone]. 

## 2019-06-13 NOTE — Telephone Encounter (Signed)
Please let the patient know that her anemia is stable. Her iron stroes are actually high. HgbA1C is 8.6%. Hep C hasn't been done yet.

## 2019-06-13 NOTE — Telephone Encounter (Signed)
-----   Message from Heather Dom, MD sent at 06/13/2019 11:59 AM EDT ----- Please let the patient know that her urine was + for blood (when I reviewed her prior urine samples they were negative).  She should do a repeat ccua for dip at her ultrasound visit and send for micro ua if +. She was supposed to get blood work at dialysis, can you check if this was done.

## 2019-06-13 NOTE — Telephone Encounter (Signed)
Left message to call Kaitlyn at [phone]. 

## 2019-06-14 DIAGNOSIS — I313 Pericardial effusion (noninflammatory): Secondary | ICD-10-CM | POA: Insufficient documentation

## 2019-06-14 DIAGNOSIS — I3139 Other pericardial effusion (noninflammatory): Secondary | ICD-10-CM | POA: Insufficient documentation

## 2019-06-14 NOTE — Telephone Encounter (Signed)
Patient is returning call to Kaitlyn.  

## 2019-06-14 NOTE — Telephone Encounter (Signed)
Call placed to patient to review benefits and schedule recommended ultrasound with possible endometrial biopsy. Left voicemail message requesting a return call

## 2019-06-15 ENCOUNTER — Telehealth: Payer: Self-pay | Admitting: Obstetrics and Gynecology

## 2019-06-15 NOTE — Telephone Encounter (Signed)
Spoke with patient, advised of al results as seen below per Dr. Talbert Nan. PUS, possible EMB scheduled for 6/30 at 4pm, consult at 4:30pm with Dr. Talbert Nan. Patient needs late afternoon appt, has dialysis on Tue/Thur. Advised to take Motrin 800 mg with food and water one hour before procedure. Patient verbalizes understanding and is agreeable. Patient is aware she will be contacted prior to appt to review benefits.   Routing to provider for final review. Patient is agreeable to disposition. Will close encounter.  Cc: Lerry Liner, Magdalene Patricia

## 2019-06-15 NOTE — Telephone Encounter (Signed)
Patient canceled her PUS possible EMB 06/26/19 due to having surgery on that day.[name] She would like to reschedule.

## 2019-06-15 NOTE — Telephone Encounter (Signed)
Patient is returning call to [facility].  Cc: Triage & Gay Filler.

## 2019-06-16 LAB — CHLAMYDIA/GONOCOCCUS/TRICHOMONAS, NAA
Chlamydia by NAA: NEGATIVE
Gonococcus by NAA: NEGATIVE
Trich vag by NAA: NEGATIVE

## 2019-06-18 NOTE — Telephone Encounter (Signed)
Spoke with patient, she is having her dialysis catheter removed, request to reschedule PUS, possible EMB, to later date. PUS rescheduled to 7/7 at 4pm, consult at 4:30pm with Dr. Talbert Nan. Advised patient to continue to abstain from intercourse. Patient verbalizes understanding and is agreeable.   Routing to provider for final review. Patient is agreeable to disposition. Will close encounter.  Cc: Lerry Liner, Magdalene Patricia

## 2019-06-20 ENCOUNTER — Other Ambulatory Visit: Payer: Self-pay | Admitting: Family Medicine

## 2019-06-20 DIAGNOSIS — E876 Hypokalemia: Secondary | ICD-10-CM

## 2019-06-26 ENCOUNTER — Other Ambulatory Visit: Payer: Self-pay

## 2019-06-26 ENCOUNTER — Other Ambulatory Visit: Payer: Self-pay | Admitting: Obstetrics and Gynecology

## 2019-07-02 ENCOUNTER — Other Ambulatory Visit: Payer: Self-pay | Admitting: Family Medicine

## 2019-07-03 ENCOUNTER — Other Ambulatory Visit: Payer: Self-pay

## 2019-07-03 ENCOUNTER — Ambulatory Visit (INDEPENDENT_AMBULATORY_CARE_PROVIDER_SITE_OTHER): Payer: Managed Care, Other (non HMO) | Admitting: Obstetrics and Gynecology

## 2019-07-03 ENCOUNTER — Ambulatory Visit (INDEPENDENT_AMBULATORY_CARE_PROVIDER_SITE_OTHER): Payer: Managed Care, Other (non HMO)

## 2019-07-03 ENCOUNTER — Encounter: Payer: Self-pay | Admitting: Obstetrics and Gynecology

## 2019-07-03 ENCOUNTER — Other Ambulatory Visit: Payer: Self-pay | Admitting: Family Medicine

## 2019-07-03 VITALS — BP 140/80 | Temp 98.0°F | Wt 228.4 lb

## 2019-07-03 DIAGNOSIS — N83202 Unspecified ovarian cyst, left side: Secondary | ICD-10-CM

## 2019-07-03 DIAGNOSIS — R102 Pelvic and perineal pain: Secondary | ICD-10-CM | POA: Diagnosis not present

## 2019-07-03 DIAGNOSIS — N946 Dysmenorrhea, unspecified: Secondary | ICD-10-CM

## 2019-07-03 DIAGNOSIS — Z3009 Encounter for other general counseling and advice on contraception: Secondary | ICD-10-CM | POA: Diagnosis not present

## 2019-07-03 DIAGNOSIS — Z8742 Personal history of other diseases of the female genital tract: Secondary | ICD-10-CM | POA: Diagnosis not present

## 2019-07-03 DIAGNOSIS — N921 Excessive and frequent menstruation with irregular cycle: Secondary | ICD-10-CM | POA: Diagnosis not present

## 2019-07-03 NOTE — Progress Notes (Signed)
GYNECOLOGY  VISIT   HPI: 48 y.o.   Married Black or [place] American Not Hispanic or Latino  female   [phone] with No LMP recorded.   here for further evaluation of pelvic pain and AUB. The patient was seen last month c/o intermittent supra pubic/pelvic pain and oligomenorrhea with heavy flow and severe dysmenorrhea. Her exam was significant for bladder tenderness, no other pelvic tenderness. Since that visit her pain has improved.  She was noted to have an ovarian cyst on CT and ultrasound earlier this year and f/u ultrasound was recommended. Her ultrasound from 02/05/19 showed multiple cysts in her left ovary, the largest was 2.8 cm, mildly complex and concerning for a hemorrhagic cyst. She had small uterine fibroids.    Labs from last month:  Hgb was 8.1 (felt to be from her chronic medical issues). Negative cervical cultures, negative urine culture, 3-10 RBC/hpf on micro ua. TSH from 3/20 was normal.  She hasn't been sexually active since before her last visit. She never took the provera that was prescribed.    Multiple medical conditions, including: diabetes (with end organ damage), kidney failure, hypertension,  H/O CVA   GYNECOLOGIC HISTORY: No LMP recorded. Contraception: none Menopausal hormone therapy: none        OB History    Gravida  2   Para  1   Term      Preterm      AB  1   Living  1     SAB  1   TAB      Ectopic      Multiple      Live Births                 Patient Active Problem List   Diagnosis Date Noted  . PD catheter dysfunction ([place]) 05/31/2019  . ESRD on peritoneal dialysis ([place]) 04/25/2019  . History of peritonitis 03/04/2019  . Leucocytosis   . Neuropathic pain   . Renovascular hypertension   . Anemia of chronic disease   . Diabetes mellitus type 2 in obese ([place])   . ESRD on dialysis ([place])   . Bacterial peritonitis ([place])   . Debility 02/13/2019  . Peritonitis, dialysis-associated, initial encounter ([place]) 02/05/2019   . Peritonitis ([place]) 02/05/2019  . Left ovarian cyst 02/05/2019  . Hemiplegia and hemiparesis following cerebral infarction affecting left non-dominant side ([place]) 12/29/2018  . Peritoneal dialysis catheter in place [facility]) 11/14/2018  . Pre-transplant evaluation for kidney transplant 11/07/2018  . ESRD with anemia ([place])   . ESRD (end stage renal disease) on dialysis ([place])   . Grade I diastolic dysfunction 35/78/9784  . Arteriovenous fistula ([place]) 07/21/2018  . Type 2 diabetes mellitus with end-stage renal disease ([place]) 07/21/2018  . History of cerebrovascular accident (CVA) involving cerebellum 07/21/2018  . Cyst of left ovary 01/12/2018  . Dysmenorrhea 01/12/2018  . Insomnia 01/12/2018  . Menorrhagia 01/12/2018  . Vitamin D deficiency 01/02/2018  . Bilateral lower extremity edema 01/02/2018  . Reactive depression 12/10/2017  . Arthralgia of right temporomandibular joint 10/24/2017  . Antiplatelet or antithrombotic long-term use: Plavix 06/30/2017  . Morbid obesity due to excess calories ([place]) 11/16/2016  . Upper airway cough syndrome 11/15/2016  . DM (diabetes mellitus), type 2, uncontrolled, with renal complications ([place]) 78/41/2820  . Dyslipidemia associated with type 2 diabetes mellitus ([place]) 06/25/2016  . Uncontrolled type 2 diabetes mellitus with both eyes affected by proliferative retinopathy and macular edema, with long-term current use of  insulin ([place]) 06/22/2016  . Uncontrolled type 2 diabetes mellitus with diabetic polyneuropathy, with long-term current use of insulin ([place]) 03/08/2016  . Proliferative diabetic retinopathy ([place]) 09/29/2015  . Constipation 09/03/2015  . Pseudophakia of both eyes 05/30/2015  . 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 B12 deficiency 05/10/2014  . Anemia associated with chronic renal failure, Procrit 04/26/2014  . CKD (chronic kidney disease) stage 5, GFR less than 15 ml/min (HCC) 04/26/2014  . Secondary hyperparathyroidism ([place]) 11/27/2013   . Posterior subcapsular cataract, bilateral 10/18/2013  . Diabetic neuropathy, painful ([place]), on low dose Gabapentin 07/13/2013  . Diabetes mellitus, type II, insulin dependent ([place]), on CGM, with end organ damage: renal, neuropathy, gastroparesis, retinopathy 02/19/2011  . Hypertension associated with diabetes ([place]) 02/19/2011  . Gastroparesis due to DM ([place]) 02/19/2011    Past Medical History:  Diagnosis Date  . Antiplatelet or antithrombotic long-term use: Plavix 06/30/2017  . B12 deficiency 05/10/2014  . Blood transfusion without reported diagnosis   . Chronic constipation   . CVA (cerebral vascular accident) ([place]), nonhemorrhagic, inferior right cerebellum 12/03/2017  . Cyst of left ovary 01/12/2018  . Diabetic neuropathy, painful ([place]), on low dose Gabapentin 07/13/2013  . DM (diabetes mellitus), type 2, uncontrolled, with renal complications ([place]) 51/01/5851  . DM2 (diabetes mellitus, type 2) ([place])   . Dysfunction of left eustachian tube, with pusatile tinnitus 11/24/2017  . Dyslipidemia associated with type 2 diabetes mellitus ([place]) 06/25/2016  . ESRD (end stage renal disease) on dialysis [facility])    On hemodialysis in July 2019 via [facility] then switched to [facility] in Nov 2019.   [name] ESRD with anemia ([place])   . Fibromyalgia   . Gastroparesis due to DM   . GERD (gastroesophageal reflux disease)   . Hypertension associated with diabetes ([place]) 02/19/2011  . IBS (irritable bowel syndrome)   . Left-sided weakness 12/10/2017   .  [name] Leiomyoma of uterus   . Pancreatitis   . Proliferative diabetic retinopathy ([place]) 09/29/2015  . 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 Reactive depression 12/10/2017   .  [name] Right-sided low back pain without sciatica 12/08/2015  . Secondary hyperthyroidism 11/27/2013  . Steal syndrome dialysis vascular access ([place]) 02/17/2018  . Thromboembolism ([place]) 04/05/2018  . Upper airway cough syndrome, with recs to stay off ACE and take Pepcid q hs 11/15/2016    Past  Surgical History:  Procedure Laterality Date  . ARTERY REPAIR Right 02/17/2018   Procedure: EXPLORATION OF RIGHT BRACHIAL ARTERY;  Surgeon: Conrad [place], MD;  Location: [place];  Service: Vascular;  Laterality: Right;  . ARTERY REPAIR Right 02/17/2018   Procedure: BRACHIAL ARTERY EXPLORATION AND TRHOMBECTOMY;  Surgeon: Conrad Goshen, MD;  Location: [facility] OR;  Service: Vascular;  Laterality: Right;  . AV FISTULA PLACEMENT Left 05/09/2017   Procedure: INSERTION OF ARTERIOVENOUS (AV) GRAFT ARM (ARTEGRAFT);  Surgeon: Conrad [place], MD;  Location: [facility] OR;  Service: Vascular;  Laterality: Left;  . AV FISTULA PLACEMENT Right 02/17/2018   Procedure: INSERTION OF ARTERIOVENOUS (AV) GORE-TEX GRAFT ARM RIGHT UPPER ARM;  Surgeon: Conrad Huslia, MD;  Location: [place];  Service: Vascular;  Laterality: Right;  . BASCILIC VEIN TRANSPOSITION Left 08/31/2016   Procedure: BASILIC VEIN TRANSPOSITION FIRST STAGE;  Surgeon: Conrad [place], MD;  Location: [place];  Service: Vascular;  Laterality: Left;  . CENTRAL VENOUS CATHETER INSERTION Left 07/24/2018   Procedure: INSERTION CENTRAL LINE ADULT;  Surgeon: Conrad Navarro, MD;  Location: [place];  Service: Vascular;  Laterality: Left;  . EYE SURGERY     secondary to diabetic retinopathy   . FOOT SURGERY Right    t"ook bone out- maybe hammer toe"  . INSERTION OF DIALYSIS CATHETER Left 07/24/2018   Procedure: INSERTION OF TUNNELED DIALYSIS CATHETER;  Surgeon: Conrad Naplate, MD;  Location: [place];  Service: Vascular;  Laterality: Left;  . IR FLUORO GUIDE CV LINE RIGHT  07/21/2018  . IR FLUORO GUIDE CV LINE RIGHT  06/01/2019  . IR US GUIDE VASC ACCESS RIGHT  07/21/2018  . IR US GUIDE VASC ACCESS RIGHT  06/01/2019  . LIGATION OF ARTERIOVENOUS  FISTULA Left 05/09/2017   Procedure: LIGATION OF ARTERIOVENOUS  FISTULA;  Surgeon: Conrad Cade, MD;  Location: [place];  Service: Vascular;  Laterality: Left;  . LOOP RECORDER INSERTION N/A 12/05/2017   Procedure: LOOP RECORDER INSERTION;  Surgeon: Evans Lance, MD;  Location: [place] CV LAB;  Service: Cardiovascular;  Laterality: N/A;  . MYOMECTOMY    . REMOVAL OF GRAFT Right 02/17/2018   Procedure: REMOVAL OF RIGHT UPPER ARM ARTERIOVENOUS GRAFT;  Surgeon: Conrad Duarte, MD;  Location: [place];  Service: Vascular;  Laterality: Right;  . REVISION OF ARTERIOVENOUS GORETEX GRAFT Left 07/24/2018   Procedure: REDO ARTERIOVENOUS GORETEX GRAFT;  Surgeon: Conrad Cimarron, MD;  Location: [place];  Service: Vascular;  Laterality: Left;  . TEE WITHOUT CARDIOVERSION N/A 12/05/2017   Procedure: TRANSESOPHAGEAL ECHOCARDIOGRAM (TEE);  Surgeon: Sanda Klein, MD;  Location: [facility] ENDOSCOPY;  Service: Cardiovascular;  Laterality: N/A;  . UPPER EXTREMITY VENOGRAPHY Left 07/13/2018   Procedure: UPPER EXTREMITY VENOGRAPHY - Central & Left Arm;  Surgeon: Conrad [place] Foreside, MD;  Location: [place] CV LAB;  Service: Cardiovascular;  Laterality: Left;  . UTERINE FIBROID SURGERY    . VITRECTOMY Bilateral     Current Outpatient Medications  Medication Sig Dispense Refill  . blood glucose meter kit and supplies KIT Dispense based on patient and insurance preference. Use up to four times daily as directed. (FOR ICD-9 250.00, 250.01). 1 each 0  . Continuous Blood Gluc Sensor (FREESTYLE LIBRE 14 DAY SENSOR) MISC 1 patch by Does not apply route every 14 (fourteen) days. 6 each 1  . gabapentin (NEURONTIN) 100 MG capsule Take 3 capsules (300 mg total) by mouth as needed (pain).    . insulin aspart (NOVOLOG FLEXPEN) 100 UNIT/ML FlexPen Inject 10-20 Units into the skin 3 (three) times daily with meals. Sliding scale    . insulin degludec (TRESIBA FLEXTOUCH) 100 UNIT/ML SOPN FlexTouch Pen Inject 28 Units into the skin daily.     [name] lubiprostone (AMITIZA) 24 MCG capsule Take 1 capsule (24 mcg total) by mouth 2 (two) times daily with a meal. (Patient taking differently: Take 24 mcg by mouth as needed for constipation. )    . medroxyPROGESTERone (PROVERA) 5 MG tablet Take 1 tablet (5 mg  total) by mouth daily. 5 tablet 0  . midodrine (PROAMATINE) 5 MG tablet Take 1 tablet (5 mg total) by mouth 2 (two) times daily with a meal. (Patient taking differently: Take 5 mg by mouth as needed (Low blood pressure). ) 180 tablet 0  . oxyCODONE (OXY IR/ROXICODONE) 5 MG immediate release tablet Take 1 tablet (5 mg total) by mouth every 4 (four) hours as needed for severe pain. (Patient not taking: Reported on 06/11/2019) 10 tablet 0  . oxyCODONE-acetaminophen (PERCOCET/ROXICET) 5-325 MG tablet Take 1 tablet by mouth every 8 (eight) hours as needed for  severe pain. (Patient not taking: Reported on 06/11/2019) 20 tablet 0  . simvastatin (ZOCOR) 10 MG tablet TAKE 1/2 TABLET BY MOUTH AT BEDTIME 45 tablet 1   No current facility-administered medications for this visit.      ALLERGIES: Ibuprofen, Dilaudid [hydromorphone hcl], and Tramadol  Family History  Problem Relation Age of Onset  . Colon polyps Mother   . Diabetes Mother   . Heart murmur Father        history essentially unknown  . Hypertension Father   . Diabetes Sister   . Kidney failure Sister        kidney transplant  . Diabetes Brother   . Retinal degeneration Brother   . Heart murmur Son   . Stroke Paternal Grandmother   . Diabetes Sister     Social History   Socioeconomic History  . Marital status: Married    Spouse name: Not on file  . Number of children: 1  . Years of education: college  . Highest education level: Not on file  Occupational History  . Occupation: Armed forces technical officer: [employer]  Social Needs  . Financial resource strain: Not on file  . Food insecurity    Worry: Not on file    Inability: Not on file  . Transportation needs    Medical: Not on file    Non-medical: Not on file  Tobacco Use  . Smoking status: Never Smoker  . Smokeless tobacco: Never Used  Substance and Sexual Activity  . Alcohol use: No    Alcohol/week: 0.0 standard drinks  . Drug use: No  . Sexual  activity: Not Currently    Partners: Male    Birth control/protection: None  Lifestyle  . Physical activity    Days per week: Not on file    Minutes per session: Not on file  . Stress: Not on file  Relationships  . Social Herbalist on phone: Not on file    Gets together: Not on file    Attends religious service: Not on file    Active member of club or organization: Not on file    Attends meetings of clubs or organizations: Not on file    Relationship status: Not on file  . Intimate partner violence    Fear of current or ex partner: Not on file    Emotionally abused: Not on file    Physically abused: Not on file    Forced sexual activity: Not on file  Other Topics Concern  . Not on file  Social History Narrative   Patient lives with fiance. She has 1 grown son. Works full-time, she Paramedic for attorney.   Caffeine Use: 2 cups daily; sodas occasionally   She has 7 grandchildren    Review of Systems  Constitutional: Negative.   HENT: Negative.   Eyes: Negative.   Respiratory: Negative.   Cardiovascular: Negative.   Gastrointestinal: Negative.   Genitourinary: Negative.   Musculoskeletal: Negative.   Skin: Negative.   Neurological: Negative.   Endo/Heme/Allergies: Negative.   Psychiatric/Behavioral: Negative.     PHYSICAL EXAMINATION:    There were no vitals taken for this visit.    General appearance: alert, cooperative and appears stated age  Pelvic: External genitalia:  no lesions              Urethra:  normal appearing urethra with no masses, tenderness or lesions  Bartholins and Skenes: normal                 Vagina: normal appearing vagina with normal color and discharge, no lesions              Cervix: no lesions  The risks of endometrial biopsy were reviewed and a consent was obtained.  A speculum was placed in the vagina and the cervix was cleansed with betadine. The mini-pipelle was placed into the endometrial cavity. The  uterus sounded to 9 cm. The endometrial biopsy was performed, moderate tissue was obtained. The speculum was removed. There were no complications.   Chaperone was present for exam.  Ultrasound images were reviewed with the patient: several small myomas, stripe 7 mm, normal right adnexa, multicystic left ovary, ~2 cm complex cyst within the left adnexa, no flow  ASSESSMENT Oligomenorrhea with menorrhagia, no findings on ultrasound Need for conraception Pelvic pain has improved H/O microscopic hematuria Multiple medical issues H/O anemia Multicystic left ovary with one 2 cm complex cyst without flow.    PLAN Endometrial biopsy done Discussed the mirena IUD, if she doesn't get the IUD I would recommend cyclic provera Discussed that pregnancy would be dangerous for her and that she needs contraception (minimum of condoms) F/U ultrasound in 3 months [name] unable to void today. Will have her f/u with Dr Juleen China for her microscopic hematuria (not sure if this is a new finding with her chronic kidney disease)   An After Visit Summary was printed and given to the patient.  ~15 minutes face to face time of which over 50% was spent in counseling.   CC: Dr Juleen China

## 2019-07-04 NOTE — Addendum Note (Signed)
Addended by: Dorothy Spark on: 07/04/2019 01:57 PM   Modules accepted: Orders

## 2019-07-10 ENCOUNTER — Telehealth: Payer: Self-pay

## 2019-07-10 DIAGNOSIS — N921 Excessive and frequent menstruation with irregular cycle: Secondary | ICD-10-CM

## 2019-07-10 DIAGNOSIS — Z30430 Encounter for insertion of intrauterine contraceptive device: Secondary | ICD-10-CM

## 2019-07-10 NOTE — Telephone Encounter (Signed)
-----   Message from Salvadore Dom, MD sent at 07/10/2019  1:00 PM EDT ----- Please let the patient know that her endometrial biopsy is benign. I think the best option for her is the mirena IUD (it will prevent pregnancy and protect the lining of her uterus).  If she doesn't want the IUD she can take provera 5 mg x 5 days every other month if no spontaneous menses. If she chooses the provera she should use condoms.

## 2019-07-10 NOTE — Telephone Encounter (Signed)
Spoke with patient. Results given. Patient verbalizes understanding. Patient would like to proceed with IUD depending on coverage. Order for Mirena insertion placed for precert. [name] aware she will be contacted with benefit information and scheduling.  Encounter closed.  CC: Lerry Liner

## 2019-07-11 ENCOUNTER — Telehealth: Payer: Self-pay | Admitting: Obstetrics and Gynecology

## 2019-07-11 ENCOUNTER — Other Ambulatory Visit: Payer: Self-pay | Admitting: Obstetrics and Gynecology

## 2019-07-11 DIAGNOSIS — N83202 Unspecified ovarian cyst, left side: Secondary | ICD-10-CM

## 2019-07-11 NOTE — Telephone Encounter (Signed)
Please have her abstain from intercourse for 2 weeks, then come in for UPT and mirena IUD insertion.

## 2019-07-11 NOTE — Telephone Encounter (Signed)
Spoke with patient and conveyed benefits for Mirena IUD. Patient understands/agreeable with the benefits. Patient is not sure when to schedule this. Please call patient for scheduling.

## 2019-07-11 NOTE — Telephone Encounter (Signed)
Spoke with patient, advised as seen below per Dr. Talbert Nan. Patient states she has not had intercourse "for awhile". Advised to continue to abstain from intercourse until IUD is placed. Has dialysis on Tues and Thursdays. IUD insertion scheduled for 8/5 at 4pm with Dr. Talbert Nan. Advised to take Motrin 800 mg with food and water one hour before procedure.   Routing to provider for final review. Patient is agreeable to disposition. Will close encounter.  Cc: Magdalene Patricia

## 2019-07-11 NOTE — Telephone Encounter (Signed)
Dr. Talbert Nan -hx of irregular menses, please advise on scheduling Mirena IUD insertion.

## 2019-07-14 IMAGING — DX DG CHEST 2V
2 series · 2 of 2 positions shown · non-contrast
Comparison: 07/24/2017 and thoracic spine 12/18/2018

CLINICAL DATA: Shortness of breath and right chest pain. Nausea and
vomiting 3 days ago.

EXAM:
CHEST - 2 VIEW

[chest lat]
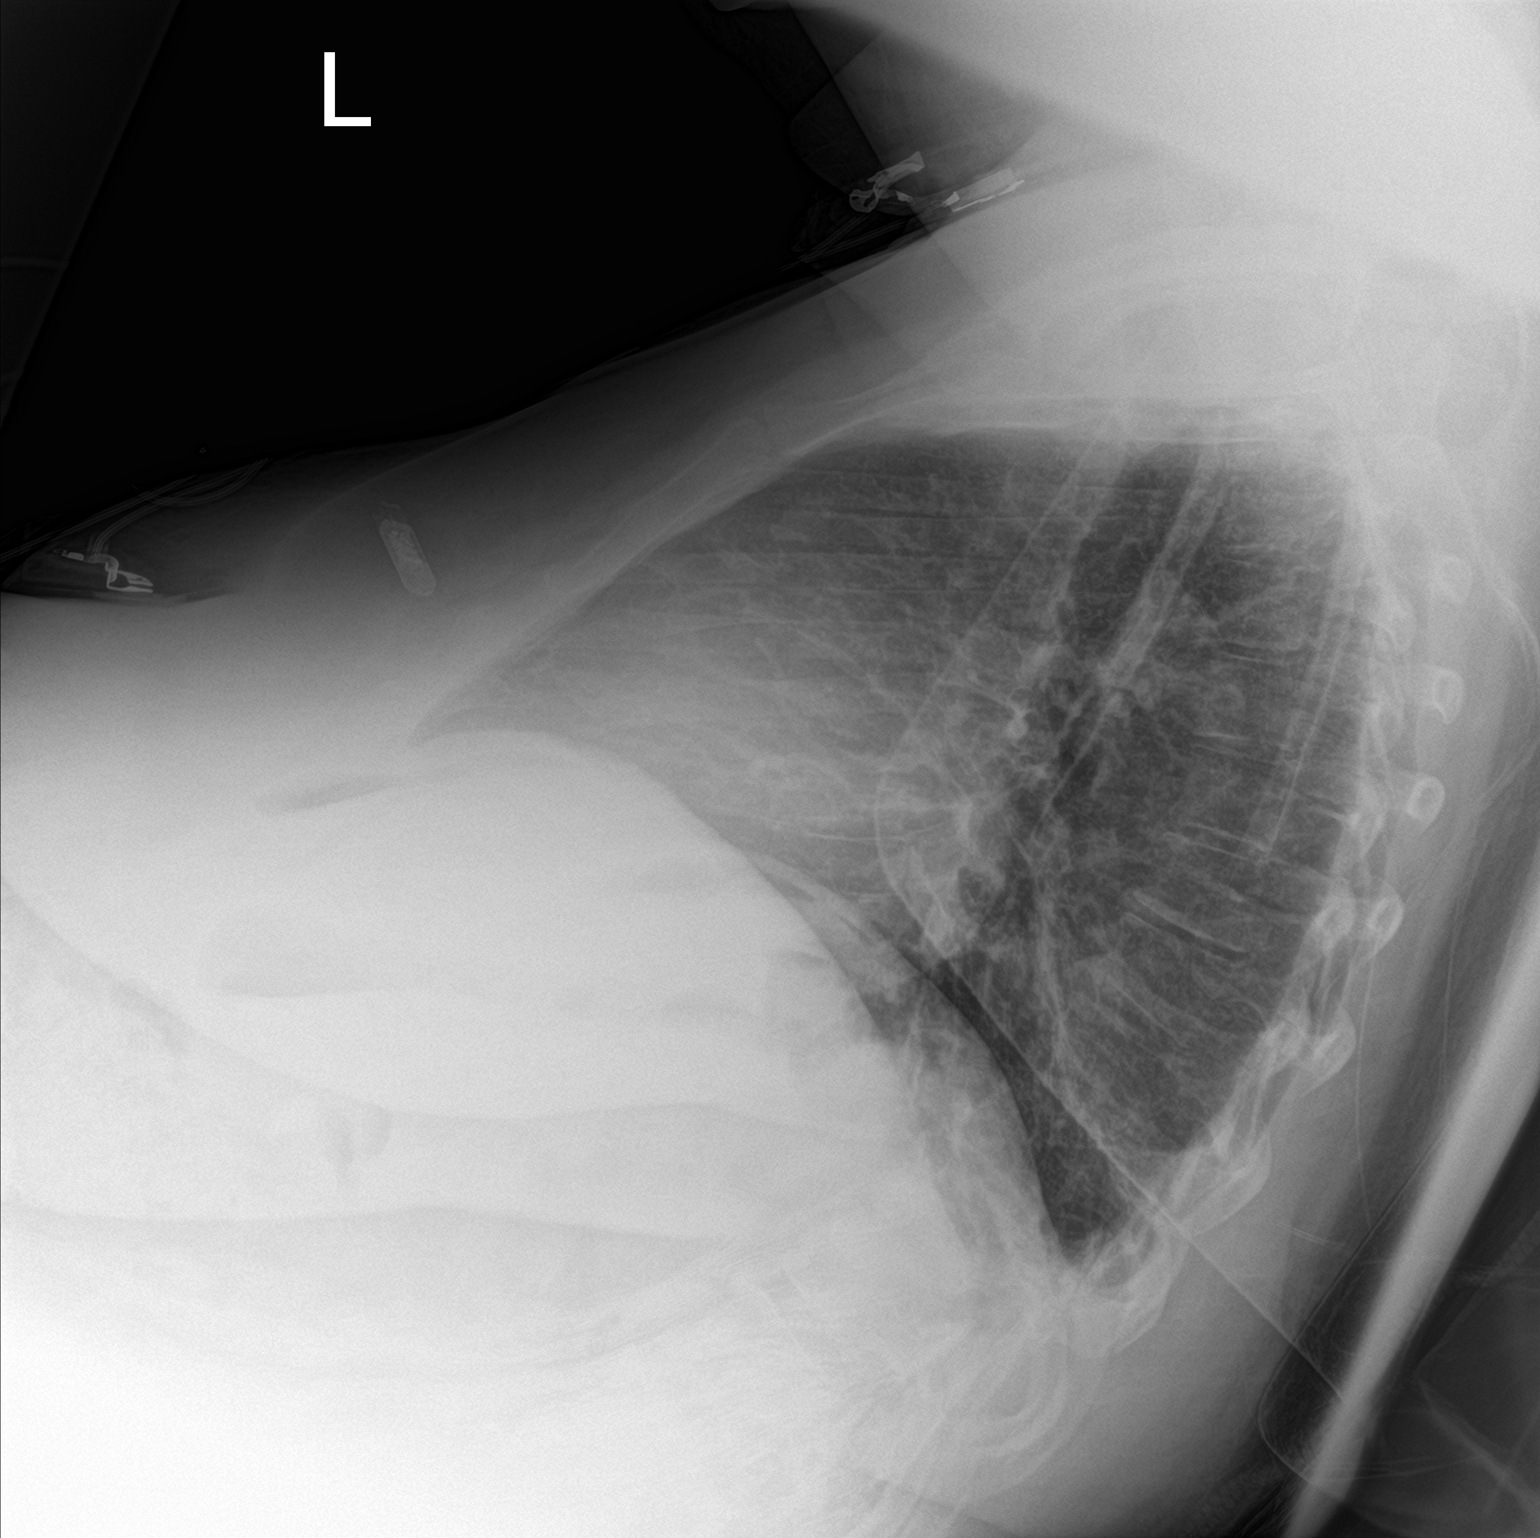

[chest ap]
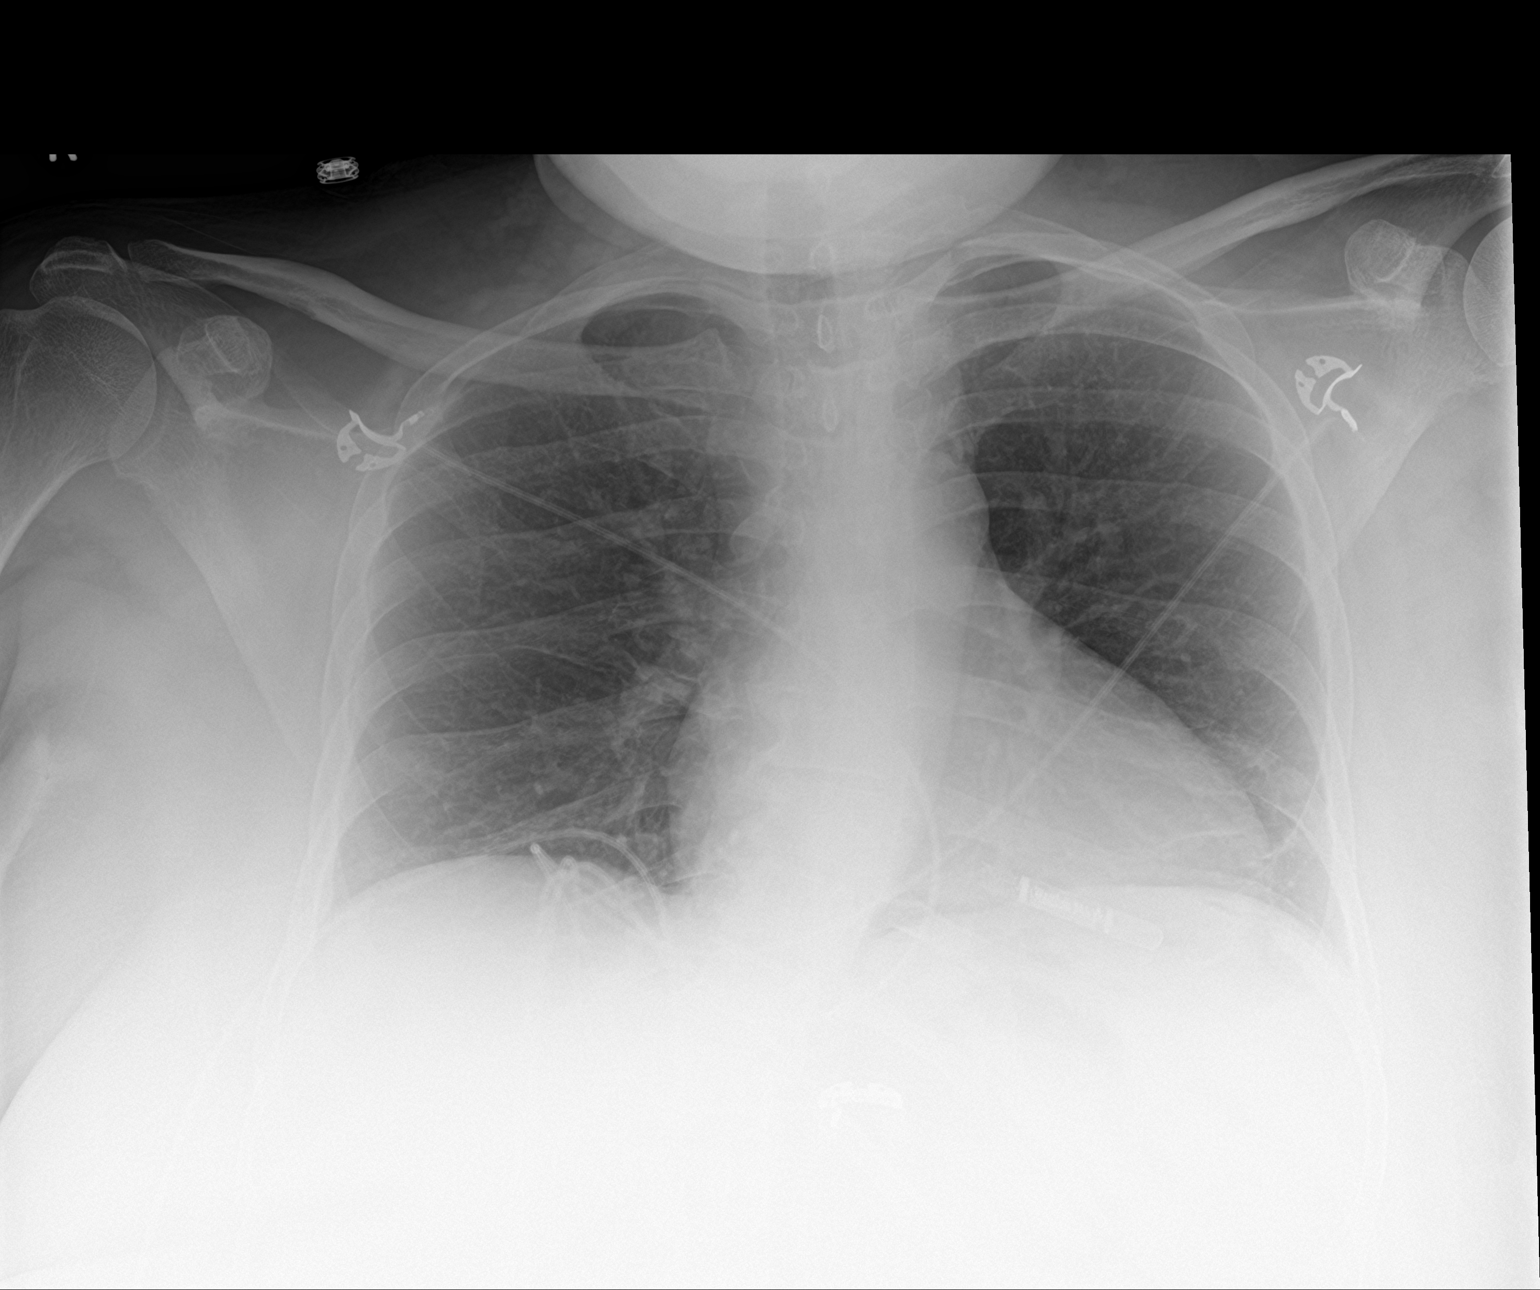

[2 of 2 positions shown; findings below may reference images not displayed]

FINDINGS: Lungs are adequately inflated and otherwise clear. Cardiomediastinal
silhouette is within normal. Stable mild compression fracture over
the midthoracic spine.
IMPRESSION: No active cardiopulmonary disease.

## 2019-07-26 NOTE — Progress Notes (Signed)
GYNECOLOGY  VISIT   HPI: 48 y.o.   Married Black or [place] American Not Hispanic or Latino  female   [phone] with No LMP recorded (lmp unknown).   here for Mirena IUD insertion for contraception and control of AUB. She has multiple medical issues and pregnancy would be dangerous for her.   Ultrasound from last month with several small myomas (not in the cavity), multicystic left ovary, complex 2 cm cyst in the left adnexa without blood flow.  Endometrial biopsy with proliferative endometrium.   GYNECOLOGIC HISTORY: No LMP recorded (lmp unknown). Contraception: None Menopausal hormone therapy: None        OB History    Gravida  2   Para  1   Term      Preterm      AB  1   Living  1     SAB  1   TAB      Ectopic      Multiple      Live Births                 Patient Active Problem List   Diagnosis Date Noted  . PD catheter dysfunction ([place]) 05/31/2019  . ESRD on peritoneal dialysis ([place]) 04/25/2019  . History of peritonitis 03/04/2019  . Leucocytosis   . Neuropathic pain   . Renovascular hypertension   . Anemia of chronic disease   . Diabetes mellitus type 2 in obese ([place])   . ESRD on dialysis ([place])   . Bacterial peritonitis ([place])   . Debility 02/13/2019  . Peritonitis, dialysis-associated, initial encounter ([place]) 02/05/2019  . Peritonitis ([place]) 02/05/2019  . Left ovarian cyst 02/05/2019  . Hemiplegia and hemiparesis following cerebral infarction affecting left non-dominant side ([place]) 12/29/2018  . Peritoneal dialysis catheter in place [facility]) 11/14/2018  . Pre-transplant evaluation for kidney transplant 11/07/2018  . ESRD with anemia ([place])   . ESRD (end stage renal disease) on dialysis ([place])   . Grade I diastolic dysfunction 22/29/7989  . Arteriovenous fistula ([place]) 07/21/2018  . Type 2 diabetes mellitus with end-stage renal disease ([place]) 07/21/2018  . History of cerebrovascular accident (CVA) involving cerebellum 07/21/2018  . Cyst of left ovary  01/12/2018  . Dysmenorrhea 01/12/2018  . Insomnia 01/12/2018  . Menorrhagia 01/12/2018  . Vitamin D deficiency 01/02/2018  . Bilateral lower extremity edema 01/02/2018  . Reactive depression 12/10/2017  . Arthralgia of right temporomandibular joint 10/24/2017  . Antiplatelet or antithrombotic long-term use: Plavix 06/30/2017  . Morbid obesity due to excess calories ([place]) 11/16/2016  . Upper airway cough syndrome 11/15/2016  . DM (diabetes mellitus), type 2, uncontrolled, with renal complications ([place]) 21/19/4174  . Dyslipidemia associated with type 2 diabetes mellitus ([place]) 06/25/2016  . Uncontrolled type 2 diabetes mellitus with both eyes affected by proliferative retinopathy and macular edema, with long-term current use of insulin ([place]) 06/22/2016  . Uncontrolled type 2 diabetes mellitus with diabetic polyneuropathy, with long-term current use of insulin ([place]) 03/08/2016  . Proliferative diabetic retinopathy ([place]) 09/29/2015  . Constipation 09/03/2015  . Pseudophakia of both eyes 05/30/2015  . 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 B12 deficiency 05/10/2014  . Anemia associated with chronic renal failure, Procrit 04/26/2014  . CKD (chronic kidney disease) stage 5, GFR less than 15 ml/min (HCC) 04/26/2014  . Secondary hyperparathyroidism ([place]) 11/27/2013  . Posterior subcapsular cataract, bilateral 10/18/2013  . Diabetic neuropathy, painful ([place]), on low dose Gabapentin 07/13/2013  . Diabetes mellitus, type II, insulin dependent ([place]),  on CGM, with end organ damage: renal, neuropathy, gastroparesis, retinopathy 02/19/2011  . Hypertension associated with diabetes ([place]) 02/19/2011  . Gastroparesis due to DM ([place]) 02/19/2011    Past Medical History:  Diagnosis Date  . Antiplatelet or antithrombotic long-term use: Plavix 06/30/2017  . B12 deficiency 05/10/2014  . Blood transfusion without reported diagnosis   . Chronic constipation   . CVA (cerebral vascular accident) ([place]),  nonhemorrhagic, inferior right cerebellum 12/03/2017  . Cyst of left ovary 01/12/2018  . Diabetic neuropathy, painful ([place]), on low dose Gabapentin 07/13/2013  . DM (diabetes mellitus), type 2, uncontrolled, with renal complications ([place]) 47/08/6282  . DM2 (diabetes mellitus, type 2) ([place])   . Dysfunction of left eustachian tube, with pusatile tinnitus 11/24/2017  . Dyslipidemia associated with type 2 diabetes mellitus ([place]) 06/25/2016  . ESRD (end stage renal disease) on dialysis [facility])    On hemodialysis in July 2019 via [facility] then switched to [facility] in Nov 2019.   [name] ESRD with anemia ([place])   . Fibromyalgia   . Gastroparesis due to DM   . GERD (gastroesophageal reflux disease)   . Hypertension associated with diabetes ([place]) 02/19/2011  . IBS (irritable bowel syndrome)   . Left-sided weakness 12/10/2017   .  [name] Leiomyoma of uterus   . Pancreatitis   . Proliferative diabetic retinopathy ([place]) 09/29/2015  . 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 Reactive depression 12/10/2017   .  [name] Right-sided low back pain without sciatica 12/08/2015  . Secondary hyperthyroidism 11/27/2013  . Steal syndrome dialysis vascular access ([place]) 02/17/2018  . Thromboembolism ([place]) 04/05/2018  . Upper airway cough syndrome, with recs to stay off ACE and take Pepcid q hs 11/15/2016    Past Surgical History:  Procedure Laterality Date  . ARTERY REPAIR Right 02/17/2018   Procedure: EXPLORATION OF RIGHT BRACHIAL ARTERY;  Surgeon: Conrad Weston, MD;  Location: [place];  Service: Vascular;  Laterality: Right;  . ARTERY REPAIR Right 02/17/2018   Procedure: BRACHIAL ARTERY EXPLORATION AND TRHOMBECTOMY;  Surgeon: Conrad Bonanza, MD;  Location: [facility] OR;  Service: Vascular;  Laterality: Right;  . AV FISTULA PLACEMENT Left 05/09/2017   Procedure: INSERTION OF ARTERIOVENOUS (AV) GRAFT ARM (ARTEGRAFT);  Surgeon: Conrad Hatfield, MD;  Location: [facility] OR;  Service: Vascular;  Laterality: Left;  . AV FISTULA PLACEMENT Right 02/17/2018    Procedure: INSERTION OF ARTERIOVENOUS (AV) GORE-TEX GRAFT ARM RIGHT UPPER ARM;  Surgeon: Conrad Genesee, MD;  Location: [place];  Service: Vascular;  Laterality: Right;  . BASCILIC VEIN TRANSPOSITION Left 08/31/2016   Procedure: BASILIC VEIN TRANSPOSITION FIRST STAGE;  Surgeon: Conrad Clarysville, MD;  Location: [place];  Service: Vascular;  Laterality: Left;  . CENTRAL VENOUS CATHETER INSERTION Left 07/24/2018   Procedure: INSERTION CENTRAL LINE ADULT;  Surgeon: Conrad Bainbridge, MD;  Location: [place];  Service: Vascular;  Laterality: Left;  . EYE SURGERY     secondary to diabetic retinopathy   . FOOT SURGERY Right    t"ook bone out- maybe hammer toe"  . INSERTION OF DIALYSIS CATHETER Left 07/24/2018   Procedure: INSERTION OF TUNNELED DIALYSIS CATHETER;  Surgeon: Conrad Inverness, MD;  Location: [place];  Service: Vascular;  Laterality: Left;  . IR FLUORO GUIDE CV LINE RIGHT  07/21/2018  . IR FLUORO GUIDE CV LINE RIGHT  06/01/2019  . IR US GUIDE VASC ACCESS RIGHT  07/21/2018  . IR US GUIDE VASC ACCESS RIGHT  06/01/2019  . LIGATION OF ARTERIOVENOUS  FISTULA Left 05/09/2017   Procedure: LIGATION OF ARTERIOVENOUS  FISTULA;  Surgeon: Conrad [place]roma Park, MD;  Location: [place];  Service: Vascular;  Laterality: Left;  . LOOP RECORDER INSERTION N/A 12/05/2017   Procedure: LOOP RECORDER INSERTION;  Surgeon: Evans Lance, MD;  Location: [place] CV LAB;  Service: Cardiovascular;  Laterality: N/A;  . MYOMECTOMY    . REMOVAL OF GRAFT Right 02/17/2018   Procedure: REMOVAL OF RIGHT UPPER ARM ARTERIOVENOUS GRAFT;  Surgeon: Conrad Gaithersburg, MD;  Location: [place];  Service: Vascular;  Laterality: Right;  . REVISION OF ARTERIOVENOUS GORETEX GRAFT Left 07/24/2018   Procedure: REDO ARTERIOVENOUS GORETEX GRAFT;  Surgeon: Conrad [place]ier Park, MD;  Location: [place];  Service: Vascular;  Laterality: Left;  . TEE WITHOUT CARDIOVERSION N/A 12/05/2017   Procedure: TRANSESOPHAGEAL ECHOCARDIOGRAM (TEE);  Surgeon: Sanda Klein, MD;  Location: [facility] ENDOSCOPY;   Service: Cardiovascular;  Laterality: N/A;  . UPPER EXTREMITY VENOGRAPHY Left 07/13/2018   Procedure: UPPER EXTREMITY VENOGRAPHY - Central & Left Arm;  Surgeon: Conrad [place], MD;  Location: [place] CV LAB;  Service: Cardiovascular;  Laterality: Left;  . UTERINE FIBROID SURGERY    . VITRECTOMY Bilateral     Current Outpatient Medications  Medication Sig Dispense Refill  . blood glucose meter kit and supplies KIT Dispense based on patient and insurance preference. Use up to four times daily as directed. (FOR ICD-9 250.00, 250.01). 1 each 0  . Continuous Blood Gluc Sensor (FREESTYLE LIBRE 14 DAY SENSOR) MISC 1 patch by Does not apply route every 14 (fourteen) days. 6 each 1  . gabapentin (NEURONTIN) 100 MG capsule Take 3 capsules (300 mg total) by mouth as needed (pain).    . insulin aspart (NOVOLOG FLEXPEN) 100 UNIT/ML FlexPen Inject 10-20 Units into the skin 3 (three) times daily with meals. Sliding scale    . insulin degludec (TRESIBA FLEXTOUCH) 100 UNIT/ML SOPN FlexTouch Pen Inject 28 Units into the skin daily.     [name] lubiprostone (AMITIZA) 24 MCG capsule Take 1 capsule (24 mcg total) by mouth 2 (two) times daily with a meal. (Patient taking differently: Take 24 mcg by mouth as needed for constipation. )    . medroxyPROGESTERone (PROVERA) 5 MG tablet Take 1 tablet (5 mg total) by mouth daily. 5 tablet 0  . midodrine (PROAMATINE) 5 MG tablet TAKE 1 TABLET (5 MG TOTAL) BY MOUTH 2 (TWO) TIMES DAILY WITH A MEAL. 180 tablet 0  . oxyCODONE (OXY IR/ROXICODONE) 5 MG immediate release tablet Take 1 tablet (5 mg total) by mouth every 4 (four) hours as needed for severe pain. 10 tablet 0  . oxyCODONE-acetaminophen (PERCOCET/ROXICET) 5-325 MG tablet Take 1 tablet by mouth every 8 (eight) hours as needed for severe pain. 20 tablet 0  . simvastatin (ZOCOR) 10 MG tablet TAKE 1/2 TABLET BY MOUTH AT BEDTIME 45 tablet 1   No current facility-administered medications for this visit.      ALLERGIES:  Ibuprofen, Dilaudid [hydromorphone hcl], and Tramadol  Family History  Problem Relation Age of Onset  . Colon polyps Mother   . Diabetes Mother   . Heart murmur Father        history essentially unknown  . Hypertension Father   . Diabetes Sister   . Kidney failure Sister        kidney transplant  . Diabetes Brother   . Retinal degeneration Brother   . Heart murmur Son   . Stroke Paternal Grandmother   . Diabetes Sister  Social History   Socioeconomic History  . Marital status: Married    Spouse name: Not on file  . Number of children: 1  . Years of education: college  . Highest education level: Not on file  Occupational History  . Occupation: Armed forces technical officer: [employer]  Social Needs  . Financial resource strain: Not on file  . Food insecurity    Worry: Not on file    Inability: Not on file  . Transportation needs    Medical: Not on file    Non-medical: Not on file  Tobacco Use  . Smoking status: Never Smoker  . Smokeless tobacco: Never Used  Substance and Sexual Activity  . Alcohol use: No    Alcohol/week: 0.0 standard drinks  . Drug use: No  . Sexual activity: Not Currently    Partners: Male    Birth control/protection: None  Lifestyle  . Physical activity    Days per week: Not on file    Minutes per session: Not on file  . Stress: Not on file  Relationships  . Social Herbalist on phone: Not on file    Gets together: Not on file    Attends religious service: Not on file    Active member of club or organization: Not on file    Attends meetings of clubs or organizations: Not on file    Relationship status: Not on file  . Intimate partner violence    Fear of current or ex partner: Not on file    Emotionally abused: Not on file    Physically abused: Not on file    Forced sexual activity: Not on file  Other Topics Concern  . Not on file  Social History Narrative   Patient lives with fiance. She has 1 grown son.  Works full-time, she Paramedic for attorney.   Caffeine Use: 2 cups daily; sodas occasionally   She has 7 grandchildren    Review of Systems  Constitutional: Negative.   HENT: Negative.   Eyes: Negative.   Respiratory: Negative.   Cardiovascular: Negative.   Gastrointestinal: Negative.   Genitourinary: Negative.   Musculoskeletal: Negative.   Skin: Negative.   Neurological: Negative.   Endo/Heme/Allergies: Negative.   Psychiatric/Behavioral: Negative.     PHYSICAL EXAMINATION:    BP 134/74 (BP Location: Right Arm, Patient Position: Sitting, Cuff Size: Large)   Pulse 76   Temp 97.9 F (36.6 C) (Skin)   Wt 220 lb (99.8 kg)   LMP  (LMP Unknown) Comment: beginning of May  BMI 32.49 kg/m     General appearance: alert, cooperative and appears stated age  Pelvic: External genitalia:  no lesions              Urethra:  normal appearing urethra with no masses, tenderness or lesions              Bartholins and Skenes: normal                 Vagina: normal appearing vagina with normal color and discharge, no lesions              Cervix: no lesions  The risks of the mirena IUD were reviewed with the patient, including infection, abnormal bleeding and uterine perfortion. Consent was signed.  A speculum was placed in the vagina, the cervix was cleansed with betadine. A tenaculum was placed on the cervix, the uterus sounded to 8-9 cm. The cervix  was easily dilated to a 5 hagar dilator  The mirena IUD was inserted without difficulty. The string were cut to 3-4 cm. The tenaculum was removed. Slight oozing from the tenaculum site was stopped with pressure.   The patient tolerated the procedure well.    Chaperone was present for exam.  ASSESSMENT Contraception AUB, negative evaluation (small myomas, not in cavity) Left complex ovarian cyst Multiple medical issues  H/O microscopic hematuria, has end stage renal disease, on dialysis  PLAN Mirena IUD placed F/U in one  month Has f/u ultrasound for her ovarian cyst in 10/20 Send urine for micro ua   An After Visit Summary was printed and given to the patient.

## 2019-07-27 ENCOUNTER — Telehealth: Payer: Self-pay | Admitting: Family Medicine

## 2019-07-27 NOTE — Telephone Encounter (Signed)
Patient called advising that she was bringing in some important paperwork today that needed to be filled out and ready for her to pick up before Aug 5 as it is due Aug 5. I explained that she could drop off the paperwork as Dr. Juleen China would probably not be able to stop to fill it out as she is full with patients today. She would just like Dr. Alcario Drought team to be aware of the paperwork she will be dropping off today so it can be completed on time.   Please be on stand by for paperwork

## 2019-07-30 ENCOUNTER — Other Ambulatory Visit: Payer: Self-pay

## 2019-08-01 ENCOUNTER — Other Ambulatory Visit: Payer: Self-pay

## 2019-08-01 ENCOUNTER — Encounter: Payer: Self-pay | Admitting: Obstetrics and Gynecology

## 2019-08-01 ENCOUNTER — Ambulatory Visit (INDEPENDENT_AMBULATORY_CARE_PROVIDER_SITE_OTHER): Payer: Managed Care, Other (non HMO) | Admitting: Obstetrics and Gynecology

## 2019-08-01 VITALS — BP 134/74 | HR 76 | Temp 97.9°F | Wt 220.0 lb

## 2019-08-01 DIAGNOSIS — Z3009 Encounter for other general counseling and advice on contraception: Secondary | ICD-10-CM

## 2019-08-01 DIAGNOSIS — R3129 Other microscopic hematuria: Secondary | ICD-10-CM | POA: Diagnosis not present

## 2019-08-01 DIAGNOSIS — Z01812 Encounter for preprocedural laboratory examination: Secondary | ICD-10-CM

## 2019-08-01 DIAGNOSIS — N921 Excessive and frequent menstruation with irregular cycle: Secondary | ICD-10-CM

## 2019-08-01 DIAGNOSIS — Z30430 Encounter for insertion of intrauterine contraceptive device: Secondary | ICD-10-CM | POA: Diagnosis not present

## 2019-08-01 LAB — POCT URINALYSIS DIPSTICK
Bilirubin, UA: NEGATIVE
Blood, UA: POSITIVE
Glucose, UA: POSITIVE — AB
Ketones, UA: POSITIVE
Leukocytes, UA: NEGATIVE
Nitrite, UA: NEGATIVE
Protein, UA: POSITIVE — AB
Spec Grav, UA: 1.01 (ref 1.010–1.025)
Urobilinogen, UA: 0.2 E.U./dL
pH, UA: 5 (ref 5.0–8.0)

## 2019-08-01 LAB — POCT URINE PREGNANCY: Preg Test, Ur: NEGATIVE

## 2019-08-01 NOTE — Patient Instructions (Signed)
IUD Post-procedure Instructions Cramping is common.  You may take Ibuprofen, Aleve, or Tylenol for the cramping.  This should resolve within 24 hours.   You may have a small amount of spotting.  You should wear a mini pad for the next few days. You may have intercourse in 24 hours. You need to call the office if you have any pelvic pain, fever, heavy bleeding, or foul smelling vaginal discharge. Shower or bathe as normal Use back up contraception for one week 

## 2019-08-02 NOTE — Addendum Note (Signed)
Addended by: Dorothy Spark on: 08/02/2019 05:38 PM   Modules accepted: Orders

## 2019-08-04 LAB — URINALYSIS, MICROSCOPIC ONLY
Casts: NONE SEEN /lpf
WBC, UA: >30 /hpf — AB (ref 0–5)

## 2019-08-27 ENCOUNTER — Other Ambulatory Visit: Payer: Self-pay

## 2019-08-27 DIAGNOSIS — N186 End stage renal disease: Secondary | ICD-10-CM

## 2019-08-27 DIAGNOSIS — Z992 Dependence on renal dialysis: Secondary | ICD-10-CM

## 2019-08-28 NOTE — Progress Notes (Deleted)
GYNECOLOGY  VISIT   HPI: 48 y.o.   Married Black or [place] American Not Hispanic or Latino  female   [phone] with No LMP recorded.   here for     GYNECOLOGIC HISTORY: No LMP recorded. Contraception:*** Menopausal hormone therapy: ***        OB History    Gravida  2   Para  1   Term      Preterm      AB  1   Living  1     SAB  1   TAB      Ectopic      Multiple      Live Births                 Patient Active Problem List   Diagnosis Date Noted  . PD catheter dysfunction ([place]) 05/31/2019  . ESRD on peritoneal dialysis ([place]) 04/25/2019  . History of peritonitis 03/04/2019  . Leucocytosis   . Neuropathic pain   . Renovascular hypertension   . Anemia of chronic disease   . Diabetes mellitus type 2 in obese ([place])   . ESRD on dialysis ([place])   . Bacterial peritonitis ([place])   . Debility 02/13/2019  . Peritonitis, dialysis-associated, initial encounter ([place]) 02/05/2019  . Peritonitis ([place]) 02/05/2019  . Left ovarian cyst 02/05/2019  . Hemiplegia and hemiparesis following cerebral infarction affecting left non-dominant side ([place]) 12/29/2018  . Peritoneal dialysis catheter in place [facility]) 11/14/2018  . Pre-transplant evaluation for kidney transplant 11/07/2018  . ESRD with anemia ([place])   . ESRD (end stage renal disease) on dialysis ([place])   . Grade I diastolic dysfunction 82/50/0370  . Arteriovenous fistula ([place]) 07/21/2018  . Type 2 diabetes mellitus with end-stage renal disease ([place]) 07/21/2018  . History of cerebrovascular accident (CVA) involving cerebellum 07/21/2018  . Cyst of left ovary 01/12/2018  . Dysmenorrhea 01/12/2018  . Insomnia 01/12/2018  . Menorrhagia 01/12/2018  . Vitamin D deficiency 01/02/2018  . Bilateral lower extremity edema 01/02/2018  . Reactive depression 12/10/2017  . Arthralgia of right temporomandibular joint 10/24/2017  . Antiplatelet or antithrombotic long-term use: Plavix 06/30/2017  . Morbid obesity due to excess calories  ([place]) 11/16/2016  . Upper airway cough syndrome 11/15/2016  . DM (diabetes mellitus), type 2, uncontrolled, with renal complications ([place]) 48/88/9169  . Dyslipidemia associated with type 2 diabetes mellitus ([place]) 06/25/2016  . Uncontrolled type 2 diabetes mellitus with both eyes affected by proliferative retinopathy and macular edema, with long-term current use of insulin ([place]) 06/22/2016  . Uncontrolled type 2 diabetes mellitus with diabetic polyneuropathy, with long-term current use of insulin ([place]) 03/08/2016  . Proliferative diabetic retinopathy ([place]) 09/29/2015  . Constipation 09/03/2015  . Pseudophakia of both eyes 05/30/2015  . 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 B12 deficiency 05/10/2014  . Anemia associated with chronic renal failure, [place] 04/26/2014  . CKD (chronic kidney disease) stage 5, GFR less than 15 ml/min (HCC) 04/26/2014  . Secondary hyperparathyroidism ([place]) 11/27/2013  . Posterior subcapsular cataract, bilateral 10/18/2013  . Diabetic neuropathy, painful ([place]), on low dose Gabapentin 07/13/2013  . Diabetes mellitus, type II, insulin dependent ([place]), on CGM, with end organ damage: renal, neuropathy, gastroparesis, retinopathy 02/19/2011  . Hypertension associated with diabetes ([place]) 02/19/2011  . Gastroparesis due to DM ([place]) 02/19/2011    Past Medical History:  Diagnosis Date  . Antiplatelet or antithrombotic long-term use: Plavix 06/30/2017  . B12 deficiency 05/10/2014  . Blood transfusion without reported diagnosis   .  Chronic constipation   . CVA (cerebral vascular accident) ([place]), nonhemorrhagic, inferior right cerebellum 12/03/2017  . Cyst of left ovary 01/12/2018  . Diabetic neuropathy, painful ([place]), on low dose Gabapentin 07/13/2013  . DM (diabetes mellitus), type 2, uncontrolled, with renal complications ([place]) 44/96/7591  . DM2 (diabetes mellitus, type 2) ([place])   . Dysfunction of left eustachian tube, with pusatile tinnitus 11/24/2017  .  Dyslipidemia associated with type 2 diabetes mellitus ([place]) 06/25/2016  . ESRD (end stage renal disease) on dialysis [facility])    On hemodialysis in July 2019 via [facility] then switched to CCPD in Nov 2019.   [name] ESRD with anemia ([place])   . Fibromyalgia   . Gastroparesis due to DM   . GERD (gastroesophageal reflux disease)   . Hypertension associated with diabetes ([place]) 02/19/2011  . IBS (irritable bowel syndrome)   . Left-sided weakness 12/10/2017   .  [name] Leiomyoma of uterus   . Pancreatitis   . Proliferative diabetic retinopathy ([place]) 09/29/2015  . 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 Reactive depression 12/10/2017   .  [name] Right-sided low back pain without sciatica 12/08/2015  . Secondary hyperthyroidism 11/27/2013  . Steal syndrome dialysis vascular access ([place]) 02/17/2018  . Thromboembolism ([place]) 04/05/2018  . Upper airway cough syndrome, with recs to stay off ACE and take Pepcid q hs 11/15/2016    Past Surgical History:  Procedure Laterality Date  . ARTERY REPAIR Right 02/17/2018   Procedure: EXPLORATION OF RIGHT BRACHIAL ARTERY;  Surgeon: Conrad Mabscott, MD;  Location: [place];  Service: Vascular;  Laterality: Right;  . ARTERY REPAIR Right 02/17/2018   Procedure: BRACHIAL ARTERY EXPLORATION AND TRHOMBECTOMY;  Surgeon: Conrad Elgin, MD;  Location: [facility] OR;  Service: Vascular;  Laterality: Right;  . AV FISTULA PLACEMENT Left 05/09/2017   Procedure: INSERTION OF ARTERIOVENOUS (AV) GRAFT ARM (ARTEGRAFT);  Surgeon: Conrad [place], MD;  Location: [facility] OR;  Service: Vascular;  Laterality: Left;  . AV FISTULA PLACEMENT Right 02/17/2018   Procedure: INSERTION OF ARTERIOVENOUS (AV) GORE-TEX GRAFT ARM RIGHT UPPER ARM;  Surgeon: Conrad [place], MD;  Location: [place];  Service: Vascular;  Laterality: Right;  . BASCILIC VEIN TRANSPOSITION Left 08/31/2016   Procedure: BASILIC VEIN TRANSPOSITION FIRST STAGE;  Surgeon: Conrad Clovis, MD;  Location: [place];  Service: Vascular;  Laterality: Left;  . CENTRAL VENOUS  CATHETER INSERTION Left 07/24/2018   Procedure: INSERTION CENTRAL LINE ADULT;  Surgeon: Conrad Vilas, MD;  Location: [place];  Service: Vascular;  Laterality: Left;  . EYE SURGERY     secondary to diabetic retinopathy   . FOOT SURGERY Right    t"ook bone out- maybe hammer toe"  . INSERTION OF DIALYSIS CATHETER Left 07/24/2018   Procedure: INSERTION OF TUNNELED DIALYSIS CATHETER;  Surgeon: Conrad Carlinville, MD;  Location: [place];  Service: Vascular;  Laterality: Left;  . IR FLUORO GUIDE CV LINE RIGHT  07/21/2018  . IR FLUORO GUIDE CV LINE RIGHT  06/01/2019  . IR US GUIDE VASC ACCESS RIGHT  07/21/2018  . IR US GUIDE VASC ACCESS RIGHT  06/01/2019  . LIGATION OF ARTERIOVENOUS  FISTULA Left 05/09/2017   Procedure: LIGATION OF ARTERIOVENOUS  FISTULA;  Surgeon: Conrad [place], MD;  Location: [place];  Service: Vascular;  Laterality: Left;  . LOOP RECORDER INSERTION N/A 12/05/2017   Procedure: LOOP RECORDER INSERTION;  Surgeon: Evans Lance, MD;  Location: [place] CV LAB;  Service: Cardiovascular;  Laterality: N/A;  .  MYOMECTOMY    . REMOVAL OF GRAFT Right 02/17/2018   Procedure: REMOVAL OF RIGHT UPPER ARM ARTERIOVENOUS GRAFT;  Surgeon: Conrad Finley, MD;  Location: [place];  Service: Vascular;  Laterality: Right;  . REVISION OF ARTERIOVENOUS GORETEX GRAFT Left 07/24/2018   Procedure: REDO ARTERIOVENOUS GORETEX GRAFT;  Surgeon: Conrad Waverly, MD;  Location: [place];  Service: Vascular;  Laterality: Left;  . TEE WITHOUT CARDIOVERSION N/A 12/05/2017   Procedure: TRANSESOPHAGEAL ECHOCARDIOGRAM (TEE);  Surgeon: Sanda Klein, MD;  Location: [facility] ENDOSCOPY;  Service: Cardiovascular;  Laterality: N/A;  . UPPER EXTREMITY VENOGRAPHY Left 07/13/2018   Procedure: UPPER EXTREMITY VENOGRAPHY - Central & Left Arm;  Surgeon: Conrad Milford, MD;  Location: [place] CV LAB;  Service: Cardiovascular;  Laterality: Left;  . UTERINE FIBROID SURGERY    . VITRECTOMY Bilateral     Current Outpatient Medications  Medication Sig  Dispense Refill  . blood glucose meter kit and supplies KIT Dispense based on patient and insurance preference. Use up to four times daily as directed. (FOR ICD-9 250.00, 250.01). 1 each 0  . Continuous Blood Gluc Sensor (FREESTYLE LIBRE 14 DAY SENSOR) MISC 1 patch by Does not apply route every 14 (fourteen) days. 6 each 1  . gabapentin (NEURONTIN) 100 MG capsule Take 3 capsules (300 mg total) by mouth as needed (pain).    . insulin aspart (NOVOLOG FLEXPEN) 100 UNIT/ML FlexPen Inject 10-20 Units into the skin 3 (three) times daily with meals. Sliding scale    . insulin degludec (TRESIBA FLEXTOUCH) 100 UNIT/ML SOPN FlexTouch Pen Inject 28 Units into the skin daily.     [name] lubiprostone (AMITIZA) 24 MCG capsule Take 1 capsule (24 mcg total) by mouth 2 (two) times daily with a meal. (Patient taking differently: Take 24 mcg by mouth as needed for constipation. )    . medroxyPROGESTERone (PROVERA) 5 MG tablet Take 1 tablet (5 mg total) by mouth daily. 5 tablet 0  . midodrine (PROAMATINE) 5 MG tablet TAKE 1 TABLET (5 MG TOTAL) BY MOUTH 2 (TWO) TIMES DAILY WITH A MEAL. 180 tablet 0  . oxyCODONE (OXY IR/ROXICODONE) 5 MG immediate release tablet Take 1 tablet (5 mg total) by mouth every 4 (four) hours as needed for severe pain. 10 tablet 0  . oxyCODONE-acetaminophen (PERCOCET/ROXICET) 5-325 MG tablet Take 1 tablet by mouth every 8 (eight) hours as needed for severe pain. 20 tablet 0  . simvastatin (ZOCOR) 10 MG tablet TAKE 1/2 TABLET BY MOUTH AT BEDTIME 45 tablet 1   No current facility-administered medications for this visit.      ALLERGIES: Ibuprofen, Dilaudid [hydromorphone hcl], and Tramadol  Family History  Problem Relation Age of Onset  . Colon polyps Mother   . Diabetes Mother   . Heart murmur Father        history essentially unknown  . Hypertension Father   . Diabetes Sister   . Kidney failure Sister        kidney transplant  . Diabetes Brother   . Retinal degeneration Brother   . Heart  murmur Son   . Stroke Paternal Grandmother   . Diabetes Sister     Social History   Socioeconomic History  . Marital status: Married    Spouse name: Not on file  . Number of children: 1  . Years of education: college  . Highest education level: Not on file  Occupational History  . Occupation: Armed forces technical officer: [employer]  Needs  . Financial resource strain: Not on file  . Food insecurity    Worry: Not on file    Inability: Not on file  . Transportation needs    Medical: Not on file    Non-medical: Not on file  Tobacco Use  . Smoking status: Never Smoker  . Smokeless tobacco: Never Used  Substance and Sexual Activity  . Alcohol use: No    Alcohol/week: 0.0 standard drinks  . Drug use: No  . Sexual activity: Not Currently    Partners: Male    Birth control/protection: None  Lifestyle  . Physical activity    Days per week: Not on file    Minutes per session: Not on file  . Stress: Not on file  Relationships  . Social Herbalist on phone: Not on file    Gets together: Not on file    Attends religious service: Not on file    Active member of club or organization: Not on file    Attends meetings of clubs or organizations: Not on file    Relationship status: Not on file  . Intimate partner violence    Fear of current or ex partner: Not on file    Emotionally abused: Not on file    Physically abused: Not on file    Forced sexual activity: Not on file  Other Topics Concern  . Not on file  Social History Narrative   Patient lives with fiance. She has 1 grown son. Works full-time, she Paramedic for attorney.   Caffeine Use: 2 cups daily; sodas occasionally   She has 7 grandchildren    ROS  PHYSICAL EXAMINATION:    There were no vitals taken for this visit.    General appearance: alert, cooperative and appears stated age Neck: no adenopathy, supple, symmetrical, trachea midline and thyroid {CHL AMB PHY EX THYROID NORM  [phone]::"normal to inspection and palpation"} Breasts: {Exam; breast:[phone]::"normal appearance, no masses or tenderness"} Abdomen: soft, non-tender; non distended, no masses,  no organomegaly  Pelvic: External genitalia:  no lesions              Urethra:  normal appearing urethra with no masses, tenderness or lesions              Bartholins and Skenes: normal                 Vagina: normal appearing vagina with normal color and discharge, no lesions              Cervix: {CHL AMB PHY EX CERVIX NORM DEFAULT:[phone]::"no lesions"}              Bimanual Exam:  Uterus:  {CHL AMB PHY EX UTERUS NORM [phone]::"normal size, contour, position, consistency, mobility, non-tender"}              Adnexa: {CHL AMB PHY EX ADNEXA NO MASS [phone]::"no mass, fullness, tenderness"}              Rectovaginal: {yes no:[id number]}.  Confirms.              Anus:  normal sphincter tone, no lesions  Chaperone was present for exam.  ASSESSMENT     PLAN    An After Visit Summary was printed and given to the patient.  *** minutes face to face time of which over 50% was spent in counseling.

## 2019-08-30 ENCOUNTER — Telehealth (HOSPITAL_COMMUNITY): Payer: Self-pay | Admitting: Rehabilitation

## 2019-08-30 NOTE — Telephone Encounter (Signed)

## 2019-08-31 ENCOUNTER — Ambulatory Visit (HOSPITAL_COMMUNITY)
Admission: RE | Admit: 2019-08-31 | Discharge: 2019-08-31 | Disposition: A | Discharge status: Home / Self Care | Payer: Managed Care, Other (non HMO) | Source: Ambulatory Visit | Attending: Family | Admitting: Family

## 2019-08-31 ENCOUNTER — Ambulatory Visit (INDEPENDENT_AMBULATORY_CARE_PROVIDER_SITE_OTHER): Payer: Managed Care, Other (non HMO) | Admitting: Vascular Surgery

## 2019-08-31 ENCOUNTER — Encounter: Payer: Self-pay | Admitting: *Deleted

## 2019-08-31 ENCOUNTER — Other Ambulatory Visit: Payer: Self-pay | Admitting: *Deleted

## 2019-08-31 ENCOUNTER — Encounter: Payer: Self-pay | Admitting: Vascular Surgery

## 2019-08-31 ENCOUNTER — Other Ambulatory Visit: Payer: Self-pay

## 2019-08-31 VITALS — BP 160/89 | HR 86 | Temp 97.8°F | Resp 20 | Ht 69.0 in | Wt 229.8 lb

## 2019-08-31 DIAGNOSIS — N186 End stage renal disease: Secondary | ICD-10-CM | POA: Diagnosis not present

## 2019-08-31 DIAGNOSIS — Z992 Dependence on renal dialysis: Secondary | ICD-10-CM | POA: Diagnosis not present

## 2019-08-31 NOTE — Progress Notes (Signed)
Patient ID: Heather [name], female   DOB: 01-Mar-1971, 48 y.o.   MRN: [id number]  Reason for Consult: New Patient (Initial Visit)   Referred by Briscoe Deutscher, DO  Subjective:     HPI:  Heather [name] is a 48 y.o. female with end-stage renal disease.  [name] currently dialyzing via right IJ catheter is been placed on 3 separate occasions.  [name] on the transplant list at [facility].  [name] undergone multiple bilateral upper extremity access procedures.  [name] here today to consider new access.  She would like to avoid groin access.  Ultrasounds arterial and venous performed prior to today's visit.  Past Medical History:  Diagnosis Date  . Antiplatelet or antithrombotic long-term use: Plavix 06/30/2017  . B12 deficiency 05/10/2014  . Blood transfusion without reported diagnosis   . Chronic constipation   . CVA (cerebral vascular accident) ([place]), nonhemorrhagic, inferior right cerebellum 12/03/2017  . Cyst of left ovary 01/12/2018  . Diabetic neuropathy, painful ([place]), on low dose Gabapentin 07/13/2013  . DM (diabetes mellitus), type 2, uncontrolled, with renal complications ([place]) 45/80/9983  . DM2 (diabetes mellitus, type 2) ([place])   . Dysfunction of left eustachian tube, with pusatile tinnitus 11/24/2017  . Dyslipidemia associated with type 2 diabetes mellitus ([place]) 06/25/2016  . ESRD (end stage renal disease) on dialysis [facility])    On hemodialysis in July 2019 via [facility] then switched to CCPD in Nov 2019.   [name] ESRD with anemia ([place])   . Fibromyalgia   . Gastroparesis due to DM   . GERD (gastroesophageal reflux disease)   . Hypertension associated with diabetes ([place]) 02/19/2011  . IBS (irritable bowel syndrome)   . Left-sided weakness 12/10/2017   .  [name] Leiomyoma of uterus   . Pancreatitis   . Proliferative diabetic retinopathy ([place]) 09/29/2015  . 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 Reactive depression 12/10/2017   .  [name] Right-sided low back pain without sciatica  12/08/2015  . Secondary hyperthyroidism 11/27/2013  . Steal syndrome dialysis vascular access ([place]) 02/17/2018  . Thromboembolism ([place]) 04/05/2018  . Upper airway cough syndrome, with recs to stay off ACE and take Pepcid q hs 11/15/2016   Family History  Problem Relation Age of Onset  . Colon polyps Mother   . Diabetes Mother   . Heart murmur Father        history essentially unknown  . Hypertension Father   . Diabetes Sister   . Kidney failure Sister        kidney transplant  . Diabetes Brother   . Retinal degeneration Brother   . Heart murmur Son   . Stroke Paternal Grandmother   . Diabetes Sister    Past Surgical History:  Procedure Laterality Date  . ARTERY REPAIR Right 02/17/2018   Procedure: EXPLORATION OF RIGHT BRACHIAL ARTERY;  Surgeon: Conrad [place], MD;  Location: [place];  Service: Vascular;  Laterality: Right;  . ARTERY REPAIR Right 02/17/2018   Procedure: BRACHIAL ARTERY EXPLORATION AND TRHOMBECTOMY;  Surgeon: Conrad [place], MD;  Location: [facility] OR;  Service: Vascular;  Laterality: Right;  . AV FISTULA PLACEMENT Left 05/09/2017   Procedure: INSERTION OF ARTERIOVENOUS (AV) GRAFT ARM (ARTEGRAFT);  Surgeon: Conrad Oz[place], MD;  Location: [facility] OR;  Service: Vascular;  Laterality: Left;  . AV FISTULA PLACEMENT Right 02/17/2018   Procedure: INSERTION OF ARTERIOVENOUS (AV) GORE-TEX GRAFT ARM RIGHT UPPER ARM;  Surgeon: Conrad [place], MD;  Location: [place];  Service: Vascular;  Laterality: Right;  . BASCILIC VEIN TRANSPOSITION Left 08/31/2016   Procedure: BASILIC VEIN TRANSPOSITION FIRST STAGE;  Surgeon: Conrad [place]eker, MD;  Location: [place];  Service: Vascular;  Laterality: Left;  . CENTRAL VENOUS CATHETER INSERTION Left 07/24/2018   Procedure: INSERTION CENTRAL LINE ADULT;  Surgeon: Conrad Bogard, MD;  Location: [place];  Service: Vascular;  Laterality: Left;  . EYE SURGERY     secondary to diabetic retinopathy   . FOOT SURGERY Right    t"ook bone out- maybe hammer toe"  . INSERTION OF  DIALYSIS CATHETER Left 07/24/2018   Procedure: INSERTION OF TUNNELED DIALYSIS CATHETER;  Surgeon: Conrad Milford, MD;  Location: [place];  Service: Vascular;  Laterality: Left;  . IR FLUORO GUIDE CV LINE RIGHT  07/21/2018  . IR FLUORO GUIDE CV LINE RIGHT  06/01/2019  . IR US GUIDE VASC ACCESS RIGHT  07/21/2018  . IR US GUIDE VASC ACCESS RIGHT  06/01/2019  . LIGATION OF ARTERIOVENOUS  FISTULA Left 05/09/2017   Procedure: LIGATION OF ARTERIOVENOUS  FISTULA;  Surgeon: Conrad [place]urn, MD;  Location: [place];  Service: Vascular;  Laterality: Left;  . LOOP RECORDER INSERTION N/A 12/05/2017   Procedure: LOOP RECORDER INSERTION;  Surgeon: Evans Lance, MD;  Location: [place] CV LAB;  Service: Cardiovascular;  Laterality: N/A;  . MYOMECTOMY    . REMOVAL OF GRAFT Right 02/17/2018   Procedure: REMOVAL OF RIGHT UPPER ARM ARTERIOVENOUS GRAFT;  Surgeon: Conrad [place], MD;  Location: [place];  Service: Vascular;  Laterality: Right;  . REVISION OF ARTERIOVENOUS GORETEX GRAFT Left 07/24/2018   Procedure: REDO ARTERIOVENOUS GORETEX GRAFT;  Surgeon: Conrad [place], MD;  Location: [place];  Service: Vascular;  Laterality: Left;  . TEE WITHOUT CARDIOVERSION N/A 12/05/2017   Procedure: TRANSESOPHAGEAL ECHOCARDIOGRAM (TEE);  Surgeon: Sanda Klein, MD;  Location: [facility] ENDOSCOPY;  Service: Cardiovascular;  Laterality: N/A;  . UPPER EXTREMITY VENOGRAPHY Left 07/13/2018   Procedure: UPPER EXTREMITY VENOGRAPHY - Central & Left Arm;  Surgeon: Conrad , MD;  Location: [place] CV LAB;  Service: Cardiovascular;  Laterality: Left;  . UTERINE FIBROID SURGERY    . VITRECTOMY Bilateral     Short Social History:  Social History   Tobacco Use  . Smoking status: Never Smoker  . Smokeless tobacco: Never Used  Substance Use Topics  . Alcohol use: No    Alcohol/week: 0.0 standard drinks    Allergies  Allergen Reactions  . Ibuprofen Other (See Comments)    CKD stage 3. Should avoid.  . Dilaudid [Hydromorphone Hcl] Itching and  Other (See Comments)    Can take with Benadryl.  . Tramadol Itching    Current Outpatient Medications  Medication Sig Dispense Refill  . albuterol (VENTOLIN HFA) 108 (90 Base) MCG/ACT inhaler Inhale into the lungs.    . blood glucose meter kit and supplies KIT Dispense based on patient and insurance preference. Use up to four times daily as directed. (FOR ICD-9 250.00, 250.01). 1 each 0  . calcium acetate (PHOSLO) 667 MG capsule     . Continuous Blood Gluc Sensor (FREESTYLE LIBRE 14 DAY SENSOR) MISC 1 patch by Does not apply route every 14 (fourteen) days. 6 each 1  . gabapentin (NEURONTIN) 100 MG capsule Take 3 capsules (300 mg total) by mouth as needed (pain).    . insulin aspart (NOVOLOG FLEXPEN) 100 UNIT/ML FlexPen Inject 10-20 Units into the skin 3 (three) times daily with meals. Sliding scale    .  insulin degludec (TRESIBA FLEXTOUCH) 100 UNIT/ML SOPN FlexTouch Pen Inject 28 Units into the skin daily.     [name] linaclotide (LINZESS) 145 MCG CAPS capsule Take by mouth.    . lubiprostone (AMITIZA) 24 MCG capsule Take 1 capsule (24 mcg total) by mouth 2 (two) times daily with a meal. (Patient taking differently: Take 24 mcg by mouth as needed for constipation. )    . medroxyPROGESTERone (PROVERA) 5 MG tablet Take 1 tablet (5 mg total) by mouth daily. 5 tablet 0  . midodrine (PROAMATINE) 5 MG tablet TAKE 1 TABLET (5 MG TOTAL) BY MOUTH 2 (TWO) TIMES DAILY WITH A MEAL. 180 tablet 0   No current facility-administered medications for this visit.     Review of Systems  Constitutional:  Constitutional negative. HENT: HENT negative.  Eyes: Eyes negative.  Respiratory: Respiratory negative.  Cardiovascular: Cardiovascular negative.  GI: Gastrointestinal negative.  Musculoskeletal: Musculoskeletal negative.  Skin: Skin negative.  Neurological: Neurological negative. Hematologic: Hematologic/lymphatic negative.  Psychiatric: Psychiatric negative.        Objective:  Objective   Vitals:    08/31/19 1130  BP: (!) 160/89  Pulse: 86  Resp: 20  Temp: 97.8 F (36.6 C)  SpO2: 100%  Weight: 229 lb 12.8 oz (104.2 kg)  Height: '5\' 9"'  (1.753 m)   Body mass index is 33.94 kg/m.  Physical Exam HENT:     Head: Normocephalic.  Eyes:     Pupils: Pupils are equal, round, and reactive to light.  Neck:     Musculoskeletal: Normal range of motion.  Cardiovascular:     Rate and Rhythm: Normal rate.     Pulses:          Radial pulses are 2+ on the right side and 2+ on the left side.  Abdominal:     General: Abdomen is flat.  Musculoskeletal:     Comments: Multiple well-healed scars bilateral upper extremities  Skin:    General: Skin is warm and dry.     Capillary Refill: Capillary refill takes less than 2 seconds.  Neurological:     General: No focal deficit present.     Mental Status: [name] alert.  Psychiatric:        Mood and Affect: Mood normal.        Thought Content: Thought content normal.        Judgment: Judgment normal.     Data: I have independently turbid her vein mapping demonstrates thrombosed left brachiocephalic fistula.  Right arm has minimal cephalic or basilic vein.  Left arm basilic vein 2.29 the antecubital fossa distal upper arm 0.4 cm.  I have independently interpreted her arterial duplex to be 0.38 cm right and biphasic on the left 0.43 cm and biphasic.     Assessment/Plan:    48 year old female with multiple bilateral upper extremity access procedures.  Now presents for new access placement.  Has a marginal left basilic vein although has had a graft very near this as well in the past.  I discussed with her the concern that she is failed multiple upper extremity access procedures including most recently left arm graft that did not last very long at all.  With that I discussed beginning with bilateral upper extremity venography to ensure central venous patency and can proceed with access creation from there on a nondialysis day.  She dialyzes Tuesdays  Thursdays and Saturdays.     Waynetta Sandy MD [facility]

## 2019-09-05 ENCOUNTER — Ambulatory Visit: Payer: Managed Care, Other (non HMO) | Admitting: Obstetrics and Gynecology

## 2019-09-06 ENCOUNTER — Other Ambulatory Visit: Payer: Self-pay

## 2019-09-06 ENCOUNTER — Encounter: Payer: Self-pay | Admitting: Obstetrics and Gynecology

## 2019-09-06 ENCOUNTER — Ambulatory Visit (INDEPENDENT_AMBULATORY_CARE_PROVIDER_SITE_OTHER): Payer: Managed Care, Other (non HMO) | Admitting: Obstetrics and Gynecology

## 2019-09-06 VITALS — BP 144/82 | HR 84 | Temp 97.7°F | Resp 14 | Ht 68.0 in | Wt 228.0 lb

## 2019-09-06 DIAGNOSIS — Z30431 Encounter for routine checking of intrauterine contraceptive device: Secondary | ICD-10-CM | POA: Diagnosis not present

## 2019-09-06 DIAGNOSIS — Z975 Presence of (intrauterine) contraceptive device: Secondary | ICD-10-CM | POA: Diagnosis not present

## 2019-09-06 DIAGNOSIS — N921 Excessive and frequent menstruation with irregular cycle: Secondary | ICD-10-CM

## 2019-09-06 NOTE — Progress Notes (Signed)
GYNECOLOGY  VISIT   HPI: 48 y.o.   Married Black or [place] American Not Hispanic or Heather [name]   [id number] with Patient's last menstrual period was 08/01/2019.   here for 1 month IUD check; patient states that she has been bleeding since 08/01/19 when IUD was placed. Patient is interested in getting IUD removed    The IUD was placed for contraception and control of AUB. U/S with several small myomas, not in the cavity. Endometrial biopsy was benign.  She has been bleeding since IUD insertion, varies from light to heavy. On her heavy day she can saturate a pad in 2-3 hours. She hasn't been cramping (improvement).   GYNECOLOGIC HISTORY: Patient's last menstrual period was 08/01/2019. Contraception:Mirena Menopausal hormone therapy: none        OB History    Gravida  2   Para  1   Term      Preterm      AB  1   Living  1     SAB  1   TAB      Ectopic      Multiple      Live Births                 Patient Active Problem List   Diagnosis Date Noted  . PD catheter dysfunction ([place]) 05/31/2019  . ESRD on peritoneal dialysis ([place]) 04/25/2019  . History of peritonitis 03/04/2019  . Leucocytosis   . Neuropathic pain   . Renovascular hypertension   . Anemia of chronic disease   . Diabetes mellitus type 2 in obese ([place])   . ESRD on dialysis ([place])   . Bacterial peritonitis ([place])   . Debility 02/13/2019  . Peritonitis, dialysis-associated, initial encounter ([place]) 02/05/2019  . Peritonitis ([place]) 02/05/2019  . Left ovarian cyst 02/05/2019  . Hemiplegia and hemiparesis following cerebral infarction affecting left non-dominant side ([place]) 12/29/2018  . Peritoneal dialysis catheter in place [facility]) 11/14/2018  . Pre-transplant evaluation for kidney transplant 11/07/2018  . ESRD with anemia ([place])   . ESRD (end stage renal disease) on dialysis ([place])   . Grade I diastolic dysfunction 90/24/0973  . Arteriovenous fistula ([place]) 07/21/2018  . Type 2 diabetes mellitus with  end-stage renal disease ([place]) 07/21/2018  . History of cerebrovascular accident (CVA) involving cerebellum 07/21/2018  . Cyst of left ovary 01/12/2018  . Dysmenorrhea 01/12/2018  . Insomnia 01/12/2018  . Menorrhagia 01/12/2018  . Vitamin D deficiency 01/02/2018  . Bilateral lower extremity edema 01/02/2018  . Reactive depression 12/10/2017  . Arthralgia of right temporomandibular joint 10/24/2017  . Antiplatelet or antithrombotic long-term use: Plavix 06/30/2017  . Morbid obesity due to excess calories ([place]) 11/16/2016  . Upper airway cough syndrome 11/15/2016  . DM (diabetes mellitus), type 2, uncontrolled, with renal complications ([place]) 53/29/9242  . Dyslipidemia associated with type 2 diabetes mellitus ([place]) 06/25/2016  . Uncontrolled type 2 diabetes mellitus with both eyes affected by proliferative retinopathy and macular edema, with long-term current use of insulin ([place]) 06/22/2016  . Uncontrolled type 2 diabetes mellitus with diabetic polyneuropathy, with long-term current use of insulin ([place]) 03/08/2016  . Proliferative diabetic retinopathy ([place]) 09/29/2015  . Constipation 09/03/2015  . Pseudophakia of both eyes 05/30/2015  . 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 B12 deficiency 05/10/2014  . Anemia associated with chronic renal failure, Procrit 04/26/2014  . CKD (chronic kidney disease) stage 5, GFR less than 15 ml/min (HCC) 04/26/2014  . Secondary hyperparathyroidism ([place]) 11/27/2013  . Posterior  subcapsular cataract, bilateral 10/18/2013  . Diabetic neuropathy, painful ([place]), on low dose Gabapentin 07/13/2013  . Diabetes mellitus, type II, insulin dependent ([place]), on CGM, with end organ damage: renal, neuropathy, gastroparesis, retinopathy 02/19/2011  . Hypertension associated with diabetes ([place]) 02/19/2011  . Gastroparesis due to DM ([place]) 02/19/2011    Past Medical History:  Diagnosis Date  . Antiplatelet or antithrombotic long-term use: Plavix 06/30/2017  . B12  deficiency 05/10/2014  . Blood transfusion without reported diagnosis   . Chronic constipation   . CVA (cerebral vascular accident) ([place]), nonhemorrhagic, inferior right cerebellum 12/03/2017  . Cyst of left ovary 01/12/2018  . Diabetic neuropathy, painful ([place]), on low dose Gabapentin 07/13/2013  . DM (diabetes mellitus), type 2, uncontrolled, with renal complications ([place]) 42/87/6811  . DM2 (diabetes mellitus, type 2) ([place])   . Dysfunction of left eustachian tube, with pusatile tinnitus 11/24/2017  . Dyslipidemia associated with type 2 diabetes mellitus ([place]) 06/25/2016  . ESRD (end stage renal disease) on dialysis [facility])    On hemodialysis in July 2019 via [facility] then switched to [facility] in Nov 2019.   [name] ESRD with anemia ([place])   . Fibromyalgia   . Gastroparesis due to DM   . GERD (gastroesophageal reflux disease)   . Hypertension associated with diabetes ([place]) 02/19/2011  . IBS (irritable bowel syndrome)   . Left-sided weakness 12/10/2017   .  [name] Leiomyoma of uterus   . Pancreatitis   . Proliferative diabetic retinopathy ([place]) 09/29/2015  . 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 Reactive depression 12/10/2017   .  [name] Right-sided low back pain without sciatica 12/08/2015  . Secondary hyperthyroidism 11/27/2013  . Steal syndrome dialysis vascular access ([place]) 02/17/2018  . Thromboembolism ([place]) 04/05/2018  . Upper airway cough syndrome, with recs to stay off ACE and take Pepcid q hs 11/15/2016    Past Surgical History:  Procedure Laterality Date  . ARTERY REPAIR Right 02/17/2018   Procedure: EXPLORATION OF RIGHT BRACHIAL ARTERY;  Surgeon: Conrad Stratford, MD;  Location: [place] [facility];  Service: Vascular;  Laterality: Right;  . ARTERY REPAIR Right 02/17/2018   Procedure: BRACHIAL ARTERY EXPLORATION AND TRHOMBECTOMY;  Surgeon: Conrad Montebello, MD;  Location: [facility] OR;  Service: Vascular;  Laterality: Right;  . AV FISTULA PLACEMENT Left 05/09/2017   Procedure: INSERTION OF ARTERIOVENOUS (AV) GRAFT ARM  (ARTEGRAFT);  Surgeon: Conrad Troy, MD;  Location: [facility] OR;  Service: Vascular;  Laterality: Left;  . AV FISTULA PLACEMENT Right 02/17/2018   Procedure: INSERTION OF ARTERIOVENOUS (AV) GORE-TEX GRAFT ARM RIGHT UPPER ARM;  Surgeon: Conrad Whitakers, MD;  Location: [facility];  Service: Vascular;  Laterality: Right;  . BASCILIC VEIN TRANSPOSITION Left 08/31/2016   Procedure: BASILIC VEIN TRANSPOSITION FIRST STAGE;  Surgeon: Conrad Flandreau, MD;  Location: [facility];  Service: Vascular;  Laterality: Left;  . CENTRAL VENOUS CATHETER INSERTION Left 07/24/2018   Procedure: INSERTION CENTRAL LINE ADULT;  Surgeon: Conrad Fidelis, MD;  Location: [facility];  Service: Vascular;  Laterality: Left;  . EYE SURGERY     secondary to diabetic retinopathy   . FOOT SURGERY Right    t"ook bone out- maybe hammer toe"  . INSERTION OF DIALYSIS CATHETER Left 07/24/2018   Procedure: INSERTION OF TUNNELED DIALYSIS CATHETER;  Surgeon: Conrad Independence, MD;  Location: [facility];  Service: Vascular;  Laterality: Left;  . IR FLUORO GUIDE CV LINE RIGHT  07/21/2018  . IR FLUORO GUIDE CV LINE RIGHT  06/01/2019  .  IR US GUIDE VASC ACCESS RIGHT  07/21/2018  . IR US GUIDE VASC ACCESS RIGHT  06/01/2019  . LIGATION OF ARTERIOVENOUS  FISTULA Left 05/09/2017   Procedure: LIGATION OF ARTERIOVENOUS  FISTULA;  Surgeon: Conrad Atlas, MD;  Location: [facility];  Service: Vascular;  Laterality: Left;  . LOOP RECORDER INSERTION N/A 12/05/2017   Procedure: LOOP RECORDER INSERTION;  Surgeon: Evans Lance, MD;  Location: [facility] CV LAB;  Service: Cardiovascular;  Laterality: N/A;  . MYOMECTOMY    . REMOVAL OF GRAFT Right 02/17/2018   Procedure: REMOVAL OF RIGHT UPPER ARM ARTERIOVENOUS GRAFT;  Surgeon: Conrad Vera, MD;  Location: [facility];  Service: Vascular;  Laterality: Right;  . REVISION OF ARTERIOVENOUS GORETEX GRAFT Left 07/24/2018   Procedure: REDO ARTERIOVENOUS GORETEX GRAFT;  Surgeon: Conrad Leavenworth, MD;  Location: [facility];  Service: Vascular;  Laterality: Left;  . TEE  WITHOUT CARDIOVERSION N/A 12/05/2017   Procedure: TRANSESOPHAGEAL ECHOCARDIOGRAM (TEE);  Surgeon: Sanda Klein, MD;  Location: [facility] ENDOSCOPY;  Service: Cardiovascular;  Laterality: N/A;  . UPPER EXTREMITY VENOGRAPHY Left 07/13/2018   Procedure: UPPER EXTREMITY VENOGRAPHY - Central & Left Arm;  Surgeon: Conrad Sweet Water, MD;  Location: [place] CV LAB;  Service: Cardiovascular;  Laterality: Left;  . UTERINE FIBROID SURGERY    . VITRECTOMY Bilateral     Current Outpatient Medications  Medication Sig Dispense Refill  . aspirin-acetaminophen-caffeine (EXCEDRIN MIGRAINE) 250-250-65 MG tablet Take 1 tablet by mouth daily as needed for headache.    . blood glucose meter kit and supplies KIT Dispense based on patient and insurance preference. Use up to four times daily as directed. (FOR ICD-9 250.00, 250.01). 1 each 0  . calcium acetate (PHOSLO) 667 MG capsule Take 667 mg by mouth 3 (three) times daily with meals.     . carvedilol (COREG) 12.5 MG tablet Take 12.5 mg by mouth 4 (four) times a week. Take on non-dialysis days (Mon, Wed, Fri, and Sun)    . Continuous Blood Gluc Sensor (FREESTYLE LIBRE 14 DAY SENSOR) MISC 1 patch by Does not apply route every 14 (fourteen) days. 6 each 1  . gabapentin (NEURONTIN) 100 MG capsule Take 3 capsules (300 mg total) by mouth as needed (pain).    [name] gabapentin (NEURONTIN) 300 MG capsule Take 300 mg by mouth 2 (two) times daily.    . insulin aspart (NOVOLOG FLEXPEN) 100 UNIT/ML FlexPen Inject 6-8 Units into the skin 3 (three) times daily with meals. Sliding scale    . insulin degludec (TRESIBA FLEXTOUCH) 100 UNIT/ML SOPN FlexTouch Pen Inject 28 Units into the skin at bedtime.     [name] lubiprostone (AMITIZA) 24 MCG capsule Take 1 capsule (24 mcg total) by mouth 2 (two) times daily with a meal.    . midodrine (PROAMATINE) 5 MG tablet TAKE 1 TABLET (5 MG TOTAL) BY MOUTH 2 (TWO) TIMES DAILY WITH A MEAL. (Patient taking differently: Take 5 mg by mouth daily as needed  (hypotension). ) 180 tablet 0   No current facility-administered medications for this visit.      ALLERGIES: Ibuprofen, Dilaudid [hydromorphone hcl], and Tramadol  Family History  Problem Relation Age of Onset  . Colon polyps Mother   . Diabetes Mother   . Heart murmur Father        history essentially unknown  . Hypertension Father   . Diabetes Sister   . Kidney failure Sister        kidney transplant  . Diabetes Brother   .  Retinal degeneration Brother   . Heart murmur Son   . Stroke Paternal Grandmother   . Diabetes Sister     Social History   Socioeconomic History  . Marital status: Married    Spouse name: Not on file  . Number of children: 1  . Years of education: college  . Highest education level: Not on file  Occupational History  . Occupation: Armed forces technical officer: [employer]  Social Needs  . Financial resource strain: Not on file  . Food insecurity    Worry: Not on file    Inability: Not on file  . Transportation needs    Medical: Not on file    Non-medical: Not on file  Tobacco Use  . Smoking status: Never Smoker  . Smokeless tobacco: Never Used  Substance and Sexual Activity  . Alcohol use: No    Alcohol/week: 0.0 standard drinks  . Drug use: No  . Sexual activity: Not Currently    Partners: Male    Birth control/protection: I.U.D.  Lifestyle  . Physical activity    Days per week: Not on file    Minutes per session: Not on file  . Stress: Not on file  Relationships  . Social Herbalist on phone: Not on file    Gets together: Not on file    Attends religious service: Not on file    Active member of club or organization: Not on file    Attends meetings of clubs or organizations: Not on file    Relationship status: Not on file  . Intimate partner violence    Fear of current or ex partner: Not on file    Emotionally abused: Not on file    Physically abused: Not on file    Forced sexual activity: Not on file   Other Topics Concern  . Not on file  Social History Narrative   Patient lives with fiance. She has 1 grown son. Works full-time, she Paramedic for attorney.   Caffeine Use: 2 cups daily; sodas occasionally   She has 7 grandchildren    Review of Systems  Constitutional: Negative.   HENT: Negative.   Eyes: Negative.   Respiratory: Negative.   Cardiovascular: Negative.   Gastrointestinal: Negative.   Genitourinary:       Vaginal bleeding  Musculoskeletal: Negative.   Skin: Negative.   Neurological: Negative.   Endo/Heme/Allergies: Negative.   Psychiatric/Behavioral: Negative.     PHYSICAL EXAMINATION:    BP (!) 144/82 (BP Location: Right Arm, Patient Position: Sitting, Cuff Size: Normal)   Pulse 84   Temp 97.7 F (36.5 C) (Temporal)   Resp 14   Ht '5\' 8"'  (1.727 m)   Wt 228 lb (103.4 kg)   LMP 08/01/2019   BMI 34.67 kg/m     General appearance: alert, cooperative and appears stated age  Pelvic: External genitalia:  no lesions              Urethra:  normal appearing urethra with no masses, tenderness or lesions              Bartholins and Skenes: normal                 Vagina: normal appearing vagina with normal color and discharge, no lesions              Cervix: no cervical motion tenderness, no lesions and IUD string 3 cm  Bimanual Exam:  Uterus:  mobile, not apprecibly enlarged, minimally tender.              Adnexa: no mass, fullness, tenderness                Chaperone was present for exam.  ASSESSMENT IUD check, the patient has been bleeding since insertion. I explained to the patient that her bleeding should lessen with time. Most people stop bleeding by 3 months, typically if it goes beyond that it is spotting.  Reviewed that with her medical issues treatment options are limited and that pregnancy (although unlikely) would be very dangerous for her.     PLAN After discussion the patient is willing to continue with the IUD for now. She will  call if her bleeding doesn't improve    An After Visit Summary was printed and given to the patient.  ~15 minutes face to face time of which over 50% was spent in counseling.

## 2019-09-07 ENCOUNTER — Other Ambulatory Visit (HOSPITAL_COMMUNITY)
Admission: RE | Admit: 2019-09-07 | Discharge: 2019-09-07 | Disposition: A | Discharge status: Home / Self Care | Payer: Managed Care, Other (non HMO) | Source: Ambulatory Visit | Attending: Vascular Surgery | Admitting: Vascular Surgery

## 2019-09-07 DIAGNOSIS — Z20828 Contact with and (suspected) exposure to other viral communicable diseases: Secondary | ICD-10-CM | POA: Insufficient documentation

## 2019-09-07 DIAGNOSIS — Z01812 Encounter for preprocedural laboratory examination: Secondary | ICD-10-CM | POA: Diagnosis not present

## 2019-09-07 LAB — SARS CORONAVIRUS 2 (TAT 6-24 HRS): SARS Coronavirus 2: NEGATIVE

## 2019-09-10 ENCOUNTER — Ambulatory Visit (HOSPITAL_COMMUNITY)
Admission: RE | Admit: 2019-09-10 | Discharge: 2019-09-10 | Disposition: A | Discharge status: Home / Self Care | Payer: Managed Care, Other (non HMO) | Attending: Vascular Surgery | Admitting: Vascular Surgery

## 2019-09-10 ENCOUNTER — Other Ambulatory Visit: Payer: Self-pay

## 2019-09-10 ENCOUNTER — Encounter (HOSPITAL_COMMUNITY)
Admission: RE | Disposition: A | Discharge status: Home / Self Care | Payer: Self-pay | Source: Home / Self Care | Attending: Vascular Surgery

## 2019-09-10 DIAGNOSIS — E785 Hyperlipidemia, unspecified: Secondary | ICD-10-CM | POA: Diagnosis not present

## 2019-09-10 DIAGNOSIS — N186 End stage renal disease: Secondary | ICD-10-CM | POA: Diagnosis not present

## 2019-09-10 DIAGNOSIS — M797 Fibromyalgia: Secondary | ICD-10-CM | POA: Insufficient documentation

## 2019-09-10 DIAGNOSIS — E1122 Type 2 diabetes mellitus with diabetic chronic kidney disease: Secondary | ICD-10-CM | POA: Insufficient documentation

## 2019-09-10 DIAGNOSIS — E1140 Type 2 diabetes mellitus with diabetic neuropathy, unspecified: Secondary | ICD-10-CM | POA: Diagnosis not present

## 2019-09-10 DIAGNOSIS — Z7902 Long term (current) use of antithrombotics/antiplatelets: Secondary | ICD-10-CM | POA: Diagnosis not present

## 2019-09-10 DIAGNOSIS — Z8673 Personal history of transient ischemic attack (TIA), and cerebral infarction without residual deficits: Secondary | ICD-10-CM | POA: Diagnosis not present

## 2019-09-10 DIAGNOSIS — Z794 Long term (current) use of insulin: Secondary | ICD-10-CM | POA: Insufficient documentation

## 2019-09-10 DIAGNOSIS — Z992 Dependence on renal dialysis: Secondary | ICD-10-CM | POA: Insufficient documentation

## 2019-09-10 DIAGNOSIS — N185 Chronic kidney disease, stage 5: Secondary | ICD-10-CM | POA: Diagnosis not present

## 2019-09-10 DIAGNOSIS — Z79899 Other long term (current) drug therapy: Secondary | ICD-10-CM | POA: Diagnosis not present

## 2019-09-10 DIAGNOSIS — I120 Hypertensive chronic kidney disease with stage 5 chronic kidney disease or end stage renal disease: Secondary | ICD-10-CM | POA: Insufficient documentation

## 2019-09-10 DIAGNOSIS — D631 Anemia in chronic kidney disease: Secondary | ICD-10-CM | POA: Diagnosis not present

## 2019-09-10 DIAGNOSIS — K219 Gastro-esophageal reflux disease without esophagitis: Secondary | ICD-10-CM | POA: Insufficient documentation

## 2019-09-10 HISTORY — PX: UPPER EXTREMITY VENOGRAPHY: CATH118272

## 2019-09-10 LAB — POCT I-STAT, CHEM 8
BUN: 37 mg/dL — ABNORMAL HIGH (ref 6–20)
Calcium, Ion: 0.95 mmol/L — ABNORMAL LOW (ref 1.15–1.40)
Chloride: 98 mmol/L (ref 98–111)
Creatinine, Ser: 7.9 mg/dL — ABNORMAL HIGH (ref 0.44–1.00)
Glucose, Bld: 255 mg/dL — ABNORMAL HIGH (ref 70–99)
HCT: 30 % — ABNORMAL LOW (ref 36.0–46.0)
Hemoglobin: 10.2 g/dL — ABNORMAL LOW (ref 12.0–15.0)
Potassium: 4.5 mmol/L (ref 3.5–5.1)
Sodium: 131 mmol/L — ABNORMAL LOW (ref 135–145)
TCO2: 22 mmol/L (ref 22–32)

## 2019-09-10 LAB — GLUCOSE, CAPILLARY: Glucose-Capillary: 235 mg/dL — ABNORMAL HIGH (ref 70–99)

## 2019-09-10 LAB — HCG, SERUM, QUALITATIVE: Preg, Serum: NEGATIVE

## 2019-09-10 SURGERY — UPPER EXTREMITY VENOGRAPHY
Anesthesia: LOCAL | Laterality: Bilateral

## 2019-09-10 MED ORDER — LABETALOL HCL 5 MG/ML IV SOLN
10.0000 mg | Freq: Once | INTRAVENOUS | Status: AC
  Administered 2019-09-10: 10 mg via INTRAVENOUS

## 2019-09-10 MED ORDER — LABETALOL HCL 5 MG/ML IV SOLN
INTRAVENOUS | Status: AC
  Filled 2019-09-10: qty 4

## 2019-09-10 MED ORDER — SODIUM CHLORIDE 0.9% FLUSH: 3.0000 mL | Freq: Two times a day (BID) | INTRAVENOUS | Status: DC

## 2019-09-10 MED ORDER — SODIUM CHLORIDE 0.9 % IV SOLN: 250.0000 mL | INTRAVENOUS | Status: DC | PRN

## 2019-09-10 MED ORDER — ASPIRIN-ACETAMINOPHEN-CAFFEINE 250-250-65 MG PO TABS
1.0000 | ORAL_TABLET | Freq: Once | ORAL | Status: AC
  Administered 2019-09-10: 1 via ORAL
  Filled 2019-09-10: qty 1

## 2019-09-10 MED ORDER — IODIXANOL 320 MG/ML IV SOLN
INTRAVENOUS | Status: DC | PRN
  Administered 2019-09-10: 14:00:00 40 mL via INTRAVENOUS

## 2019-09-10 MED ORDER — SODIUM CHLORIDE 0.9% FLUSH: 3.0000 mL | INTRAVENOUS | Status: DC | PRN

## 2019-09-10 SURGICAL SUPPLY — 2 items
STOPCOCK MORSE 400PSI 3WAY (MISCELLANEOUS) ×2 IMPLANT
TUBING CIL FLEX 10 FLL-RA (TUBING) ×2 IMPLANT

## 2019-09-10 NOTE — Progress Notes (Signed)
Discharge instructions reviewed with pt.

## 2019-09-10 NOTE — Progress Notes (Signed)
IV team unable to find IV with ultrasound Rennis Harding called and informed.

## 2019-09-10 NOTE — Op Note (Signed)
    Patient name: Heather [name] MRN: [id number] DOB: Jul 08, 1971 Sex: female  09/10/2019 Pre-operative Diagnosis: esrd Post-operative diagnosis:  Same Surgeon:  Erlene Quan C. Donzetta Matters, MD Procedure Performed:  Left upper extremity venography  Indications: 48 year old female with multiple bilateral upper extremity access procedures that have all failed.  [name] now indicated for venography to plan new access.   Findings: In the left upper extremity appears to have central venous patency.  We will plan for left upper extremity AV grafting will possibly consider Artegraft given failure of previous PTFE graft.   Procedure:  The patient was identified in the holding area and taken to room 8.  The patient was then placed supine on the table and prepped and draped in the usual sterile fashion.  A time out was called.  An IV had previously been placed in the left forearm.  Left upper extremity venography was performed with the above findings.  She tolerated procedure without immediate complication.  Contrast: 40cc   Margarie Mcguirt C. Donzetta Matters, MD [facility] Office: [phone] Pager: [phone]

## 2019-09-10 NOTE — Progress Notes (Signed)
Dr Donzetta Matters called about bp last Bp was 192/97 States ok to let her go home.

## 2019-09-10 NOTE — Progress Notes (Signed)
Vomitted 200 cc of green material and c/o head ache  Dr Donzetta Matters called and informed

## 2019-09-10 NOTE — H&P (Signed)
   History and Physical Update  The patient was interviewed and re-examined.  The patient's previous History and Physical has been reviewed and is unchanged from recent office visit. Plan for bilateral upper extremity venography.  Molleigh Huot C. Donzetta Matters, MD [facility] Office: [phone] Pager: [phone]  09/10/2019, 12:55 PM

## 2019-09-10 NOTE — Progress Notes (Signed)
Dr Donzetta Matters called and informed of bp new orders noted

## 2019-09-10 NOTE — Discharge Instructions (Signed)
Venogram, Care After °This sheet gives you information about how to care for yourself after your procedure. Your health care provider may also give you more specific instructions. If you have problems or questions, contact your health care provider. °What can I expect after the procedure? °After the procedure, it is common to have: °· Bruising or mild discomfort in the area where the IV was inserted (insertion site). °Follow these instructions at home: °Eating and drinking ° °· Follow instructions from your health care provider about eating or drinking restrictions. °· Drink a lot of fluids for the first several days after the procedure, as directed by your health care provider. This helps to wash (flush) the contrast out of your body. Examples of healthy fluids include water or low-calorie drinks. °General instructions °· Check your IV insertion area every day for signs of infection. Check for: °? Redness, swelling, or pain. °? Fluid or blood. °? Warmth. °? Pus or a bad smell. °· Take over-the-counter and prescription medicines only as told by your health care provider. °· Rest and return to your normal activities as told by your health care provider. Ask your health care provider what activities are safe for you. °· Do not drive for 24 hours if you were given a medicine to help you relax (sedative), or until your health care provider approves. °· Keep all follow-up visits as told by your health care provider. This is important. °Contact a health care provider if: °· Your skin becomes itchy or you develop a rash or hives. °· You have a fever that does not get better with medicine. °· You feel nauseous. °· You vomit. °· You have redness, swelling, or pain around the insertion site. °· You have fluid or blood coming from the insertion site. °· Your insertion area feels warm to the touch. °· You have pus or a bad smell coming from the insertion site. °Get help right away if: °· You have difficulty breathing or  shortness of breath. °· You develop chest pain. °· You faint. °· You feel very dizzy. °These symptoms may represent a serious problem that is an emergency. Do not wait to see if the symptoms will go away. Get medical help right away. Call your local emergency services (911 in the [place] Do not drive yourself to the hospital. °Summary °· After your procedure, it is common to have bruising or mild discomfort in the area where the IV was inserted. °· You should check your IV insertion area every day for signs of infection. °· Take over-the-counter and prescription medicines only as told by your health care provider. °· You should drink a lot of fluids for the first several days after the procedure to help flush the contrast from your body. °This information is not intended to replace advice given to you by your health care provider. Make sure you discuss any questions you have with your health care provider. °Document Released: 10/03/2013 Document Revised: 11/25/2017 Document Reviewed: 11/06/2016 °Elsevier Patient Education © 2020 [employer] ° °

## 2019-09-11 ENCOUNTER — Encounter (HOSPITAL_COMMUNITY): Payer: Self-pay | Admitting: Vascular Surgery

## 2019-09-11 ENCOUNTER — Other Ambulatory Visit: Payer: Self-pay

## 2019-09-14 ENCOUNTER — Ambulatory Visit: Payer: Managed Care, Other (non HMO) | Admitting: Family Medicine

## 2019-09-17 ENCOUNTER — Ambulatory Visit (INDEPENDENT_AMBULATORY_CARE_PROVIDER_SITE_OTHER): Payer: Managed Care, Other (non HMO) | Admitting: Family Medicine

## 2019-09-17 ENCOUNTER — Encounter: Payer: Self-pay | Admitting: Family Medicine

## 2019-09-17 VITALS — Ht 68.0 in | Wt 229.0 lb

## 2019-09-17 DIAGNOSIS — G8929 Other chronic pain: Secondary | ICD-10-CM

## 2019-09-17 DIAGNOSIS — M79671 Pain in right foot: Secondary | ICD-10-CM

## 2019-09-17 DIAGNOSIS — D509 Iron deficiency anemia, unspecified: Secondary | ICD-10-CM | POA: Insufficient documentation

## 2019-09-17 NOTE — Progress Notes (Signed)
Virtual Visit via Video   Due to the COVID-19 pandemic, this visit was completed with telemedicine (audio/video) technology to reduce patient and provider exposure as well as to preserve personal protective equipment.   I connected with Heather [name] by a video enabled telemedicine application and verified that I am speaking with the correct person using two identifiers. Location patient: Home Location provider: [facility], Office Persons participating in the virtual visit: Heather [name], Briscoe Deutscher, DO [name], CMA acting as scribe for Dr. Briscoe Deutscher.   I discussed the limitations of evaluation and management by telemedicine and the availability of in person appointments. The patient expressed understanding and agreed to proceed.  Care Team   Patient Care Team: Briscoe Deutscher, DO as PCP - General (Family Medicine) Gerarda Fraction, MD as Referring Physician (Ophthalmology) Louanne Belton, MD as Referring Physician (Cardiology) [place], [facility]  Subjective:   HPI: Knot on right foot has been there for a few months (2-3). Denies any injury, redness, swelling, not tender to touch. Knot is on outside of foot near pinky toe. She does have some discomfort from time to time and does not last long. She does not have increased issues with walking. Pain is worst when she is lying in bed.   Review of Systems  Constitutional: Negative for chills and fever.  HENT: Negative for hearing loss and tinnitus.   Eyes: Negative for blurred vision and double vision.  Respiratory: Negative for cough and wheezing.   Cardiovascular: Negative for chest pain, palpitations and leg swelling.  Gastrointestinal: Negative for nausea and vomiting.  Genitourinary: Negative for dysuria and urgency.  Musculoskeletal: Negative for myalgias.  Neurological: Negative for dizziness and headaches.  Psychiatric/Behavioral: Negative for depression.    Patient Active Problem  List   Diagnosis Date Noted  . Pericardial effusion 06/14/2019  . PD catheter dysfunction ([place]) 05/31/2019  . ESRD on peritoneal dialysis ([place]) 04/25/2019  . History of peritonitis 03/04/2019  . Leucocytosis   . Neuropathic pain   . Renovascular hypertension   . Anemia of chronic disease   . Diabetes mellitus type 2 in obese ([place])   . ESRD on dialysis ([place])   . Bacterial peritonitis ([place])   . Debility 02/13/2019  . Peritonitis, dialysis-associated, initial encounter ([place]) 02/05/2019  . Peritonitis ([place]) 02/05/2019  . Left ovarian cyst 02/05/2019  . Hemiplegia and hemiparesis following cerebral infarction affecting left non-dominant side ([place]) 12/29/2018  . Peritoneal dialysis catheter in place [facility]) 11/14/2018  . Pre-transplant evaluation for kidney transplant 11/07/2018  . ESRD with anemia ([place])   . ESRD (end stage renal disease) on dialysis ([place])   . Grade I diastolic dysfunction 96/22/2979  . Arteriovenous fistula ([place]) 07/21/2018  . Type 2 diabetes mellitus with end-stage renal disease ([place]) 07/21/2018  . History of cerebrovascular accident (CVA) involving cerebellum 07/21/2018  . Cyst of left ovary 01/12/2018  . Dysmenorrhea 01/12/2018  . Insomnia 01/12/2018  . Menorrhagia 01/12/2018  . Vitamin D deficiency 01/02/2018  . Bilateral lower extremity edema 01/02/2018  . Reactive depression 12/10/2017  . Arthralgia of right temporomandibular joint 10/24/2017  . Diabetic ulcer of right midfoot associated with type 2 diabetes mellitus, with fat layer exposed ([place]) 08/06/2017  . IBS (irritable bowel syndrome) 08/06/2017  . Antiplatelet or antithrombotic long-term use: Plavix 06/30/2017  . Morbid obesity due to excess calories ([place]) 11/16/2016  . Upper airway cough syndrome 11/15/2016  . DM (diabetes mellitus), type 2, uncontrolled, with renal complications ([place]) 89/21/1943  .  Dyslipidemia associated with type 2 diabetes mellitus ([place]) 06/25/2016  . Uncontrolled type 2 diabetes  mellitus with both eyes affected by proliferative retinopathy and macular edema, with long-term current use of insulin ([place]) 06/22/2016  . Uncontrolled type 2 diabetes mellitus with diabetic polyneuropathy, with long-term current use of insulin ([place]) 03/08/2016  . Proliferative diabetic retinopathy ([place]) 09/29/2015  . Constipation 09/03/2015  . Pseudophakia of both eyes 05/30/2015  . 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 B12 deficiency 05/10/2014  . Anemia associated with chronic renal failure, [place] 04/26/2014  . CKD (chronic kidney disease) stage 5, GFR less than 15 ml/min (HCC) 04/26/2014  . Secondary hyperparathyroidism ([place]) 11/27/2013  . Posterior subcapsular cataract, bilateral 10/18/2013  . Diabetic neuropathy, painful ([place]), on low dose Gabapentin 07/13/2013  . Diabetes mellitus, type II, insulin dependent ([place]), on CGM, with end organ damage: renal, neuropathy, gastroparesis, retinopathy 02/19/2011  . Hypertension associated with diabetes ([place]) 02/19/2011  . Gastroparesis due to DM ([place]) 02/19/2011    Social History   Tobacco Use  . Smoking status: Never Smoker  . Smokeless tobacco: Never Used  Substance Use Topics  . Alcohol use: No    Alcohol/week: 0.0 standard drinks    Current Outpatient Medications:  .  aspirin-acetaminophen-caffeine (EXCEDRIN MIGRAINE) 250-250-65 MG tablet, Take 1 tablet by mouth daily as needed for headache., Disp: , Rfl:  .  calcium acetate (PHOSLO) 667 MG capsule, Take 667 mg by mouth 3 (three) times daily with meals. , Disp: , Rfl:  .  Continuous Blood Gluc Sensor (FREESTYLE LIBRE 14 DAY SENSOR) MISC, 1 patch by Does not apply route every 14 (fourteen) days., Disp: 6 each, Rfl: 1 .  gabapentin (NEURONTIN) 100 MG capsule, Take 3 capsules (300 mg total) by mouth as needed (pain)., Disp: , Rfl:  .  gabapentin (NEURONTIN) 300 MG capsule, Take 300 mg by mouth 2 (two) times daily., Disp: , Rfl:  .  insulin aspart (NOVOLOG FLEXPEN) 100 UNIT/ML  FlexPen, Inject 6-8 Units into the skin 3 (three) times daily with meals. Sliding scale, Disp: , Rfl:  .  insulin degludec (TRESIBA FLEXTOUCH) 100 UNIT/ML SOPN FlexTouch Pen, Inject 28 Units into the skin at bedtime. , Disp: , Rfl:  .  lubiprostone (AMITIZA) 24 MCG capsule, Take 1 capsule (24 mcg total) by mouth 2 (two) times daily with a meal., Disp: , Rfl:  .  midodrine (PROAMATINE) 5 MG tablet, TAKE 1 TABLET (5 MG TOTAL) BY MOUTH 2 (TWO) TIMES DAILY WITH A MEAL. (Patient taking differently: Take 5 mg by mouth daily as needed (hypotension). ), Disp: 180 tablet, Rfl: 0 .  B Complex-C-Zn-Folic Acid (DIALYVITE 242-ASTM 15) 0.8 MG TABS, Take 1 tablet by mouth daily., Disp: , Rfl:  .  carvedilol (COREG) 6.25 MG tablet, , Disp: , Rfl:   Allergies  Allergen Reactions  . Ibuprofen Other (See Comments)    CKD stage 3. Should avoid.  . Dilaudid [Hydromorphone Hcl] Itching and Other (See Comments)    Can take with Benadryl.  . Tramadol Itching   Objective:   VITALS: Per patient if applicable, see vitals. GENERAL: Alert, appears well and in no acute distress. HEENT: Atraumatic, conjunctiva clear, no obvious abnormalities on inspection of external nose and ears. NECK: Normal movements of the head and neck. CARDIOPULMONARY: No increased WOB. Speaking in clear sentences. I:E ratio WNL.  MS: Moves all visible extremities without noticeable abnormality. PSYCH: Pleasant and cooperative, well-groomed. Speech normal rate and rhythm. Affect is appropriate. Insight and judgement are  appropriate. Attention is focused, linear, and appropriate.  NEURO: CN grossly intact. Oriented as arrived to appointment on time with no prompting. Moves both UE equally.  SKIN: No obvious lesions, wounds, erythema, or cyanosis noted on face or hands.  Depression screen [facility] 2/9 01/02/2018 12/08/2017 06/30/2017  Decreased Interest 1 1 0  Down, Depressed, Hopeless 0 3 0  PHQ - 2 Score 1 4 0  Altered sleeping 3 3 -  Tired,  decreased energy 3 2 -  Change in appetite 1 3 -  Feeling bad or failure about yourself  0 2 -  Trouble concentrating 0 1 -  Moving slowly or fidgety/restless 1 1 -  Suicidal thoughts 0 1 -  PHQ-9 Score 9 17 -  Some recent data might be hidden    Assessment and Plan:   Navil was seen today for foot pain.  Diagnoses and all orders for this visit:  Chronic pain in right foot Comments: Sounds like a pressure ulcer. Patient will try to get a pic or come to the office for [name] to see. Okay Podiatry referral. Discussed using pillows to decrease pressure spots.   [name] COVID-19 Education: The signs and symptoms of COVID-19 were discussed with the patient and how to seek care for testing if needed. The importance of social distancing was discussed today. . Reviewed expectations re: course of current medical issues. . Discussed self-management of symptoms. . Outlined signs and symptoms indicating need for more acute intervention. . Patient verbalized understanding and all questions were answered. [name] Health Maintenance issues including appropriate healthy diet, exercise, and smoking avoidance were discussed with patient. . See orders for this visit as documented in the electronic medical record.  Briscoe Deutscher, DO  Records requested if needed. Time spent: 15 minutes, of which >50% was spent in obtaining information about her symptoms, reviewing her previous labs, evaluations, and treatments, counseling her about her condition (please see the discussed topics above), and developing a plan to further investigate it; she had a number of questions which I addressed.

## 2019-09-27 ENCOUNTER — Other Ambulatory Visit: Payer: Self-pay | Admitting: Family Medicine

## 2019-09-27 DIAGNOSIS — E119 Type 2 diabetes mellitus without complications: Secondary | ICD-10-CM

## 2019-09-27 DIAGNOSIS — Z794 Long term (current) use of insulin: Secondary | ICD-10-CM

## 2019-09-27 NOTE — Telephone Encounter (Signed)
Copied from [place] [phone]. Topic: General - Other >> Sep 27, 2019  1:28 PM Celene Kras A wrote: Reason for CRM: Pt called and is requesting to have her novolog, tresiba, needles, and 14 day libre sensors refilled. Please advise.   [id number] [place], [place] [address] [place] [address] Phone: [phone] Fax: [phone] Not a 24 hour pharmacy; exact hours not known.

## 2019-09-27 NOTE — Telephone Encounter (Signed)
See below

## 2019-09-27 NOTE — Telephone Encounter (Signed)
Requested medication (s) are due for refill today: yes  Requested medication (s) are on the active medication list: yes  Last refill:  04/10/2019  Future visit scheduled: no  Notes to clinic: Patient also needs needles   Requested Prescriptions  Pending Prescriptions Disp Refills   insulin degludec (TRESIBA FLEXTOUCH) 100 UNIT/ML SOPN FlexTouch Pen       Sig: Inject 0.28 mLs (28 Units total) into the skin at bedtime.     Endocrinology:  Diabetes - Insulins Failed - 09/27/2019  1:36 PM      Failed - HBA1C is between 0 and 7.9 and within 180 days    Hemoglobin A1C  Date Value Ref Range Status  04/19/2017 7.4  Final   Hgb A1c MFr Bld  Date Value Ref Range Status  05/31/2019 9.1 (H) 4.8 - 5.6 % Final    Comment:    (NOTE) Pre diabetes:          5.7%-6.4% Diabetes:              >6.4% Glycemic control for   <7.0% adults with diabetes          Passed - Valid encounter within last 6 months    Recent Outpatient Visits          1 week ago Chronic pain in right foot   [place] Wallace, [place], DO   3 months ago Pelvic pain   [place], [place], [place]   4 months ago Rib pain on left side   [place] Wallace, [place], DO   5 months ago Wound of right leg, initial encounter   [place] Wallace, [place], DO   6 months ago ESRD on peritoneal dialysis [facility])   [place] Wallace, [place], DO              insulin aspart (NOVOLOG FLEXPEN) 100 UNIT/ML FlexPen 15 mL     Sig: Inject 6-8 Units into the skin 3 (three) times daily with meals. Sliding scale     Endocrinology:  Diabetes - Insulins Failed - 09/27/2019  1:36 PM      Failed - HBA1C is between 0 and 7.9 and within 180 days    Hemoglobin A1C  Date Value Ref Range Status  04/19/2017 7.4  Final   Hgb A1c MFr Bld  Date Value Ref Range Status  05/31/2019 9.1 (H) 4.8 - 5.6 % Final    Comment:   (NOTE) Pre diabetes:          5.7%-6.4% Diabetes:              >6.4% Glycemic control for   <7.0% adults with diabetes          Passed - Valid encounter within last 6 months    Recent Outpatient Visits          1 week ago Chronic pain in right foot   [place] Wallace, [place], DO   3 months ago Pelvic pain   [place] Worley, [place], [place]   4 months ago Rib pain on left side   [place], DO   5 months ago Wound of right leg, initial encounter   [place] Wallace, [place], DO   6 months ago ESRD on peritoneal dialysis [facility])   [place] Wallace, [place], [place]  Continuous Blood Gluc Sensor (FREESTYLE LIBRE 14 DAY SENSOR) MISC 6 each 1    Sig: 1 patch by Does not apply route every 14 (fourteen) days.     There is no refill protocol information for this order

## 2019-10-01 NOTE — Telephone Encounter (Signed)
Rx request 

## 2019-10-05 ENCOUNTER — Ambulatory Visit: Payer: Managed Care, Other (non HMO) | Admitting: Physician Assistant

## 2019-10-06 ENCOUNTER — Other Ambulatory Visit (HOSPITAL_COMMUNITY)
Admission: RE | Admit: 2019-10-06 | Discharge: 2019-10-06 | Disposition: A | Discharge status: Home / Self Care | Payer: Managed Care, Other (non HMO) | Source: Ambulatory Visit | Attending: Vascular Surgery | Admitting: Vascular Surgery

## 2019-10-06 DIAGNOSIS — Z01812 Encounter for preprocedural laboratory examination: Secondary | ICD-10-CM | POA: Insufficient documentation

## 2019-10-06 DIAGNOSIS — Z20828 Contact with and (suspected) exposure to other viral communicable diseases: Secondary | ICD-10-CM | POA: Insufficient documentation

## 2019-10-07 LAB — NOVEL CORONAVIRUS, NAA (HOSP ORDER, SEND-OUT TO REF LAB; TAT 18-24 HRS): SARS-CoV-2, NAA: NOT DETECTED

## 2019-10-08 ENCOUNTER — Encounter (HOSPITAL_COMMUNITY): Payer: Self-pay | Admitting: *Deleted

## 2019-10-08 ENCOUNTER — Other Ambulatory Visit: Payer: Self-pay

## 2019-10-08 NOTE — Progress Notes (Deleted)
GYNECOLOGY  VISIT   HPI: 48 y.o.   Married Black or [place] American Not Hispanic or Latino  female   [phone] with No LMP recorded. (Menstrual status: IUD).   here for     GYNECOLOGIC HISTORY: No LMP recorded. (Menstrual status: IUD). Contraception:*** Menopausal hormone therapy: ***        OB History    Gravida  2   Para  1   Term      Preterm      AB  1   Living  1     SAB  1   TAB      Ectopic      Multiple      Live Births                 Patient Active Problem List   Diagnosis Date Noted  . Pericardial effusion 06/14/2019  . PD catheter dysfunction ([place]) 05/31/2019  . ESRD on peritoneal dialysis ([place]) 04/25/2019  . History of peritonitis 03/04/2019  . Leucocytosis   . Neuropathic pain   . Renovascular hypertension   . Anemia of chronic disease   . Diabetes mellitus type 2 in obese ([place])   . ESRD on dialysis ([place])   . Bacterial peritonitis ([place])   . Debility 02/13/2019  . Peritonitis, dialysis-associated, initial encounter ([place]) 02/05/2019  . Peritonitis ([place]) 02/05/2019  . Left ovarian cyst 02/05/2019  . Hemiplegia and hemiparesis following cerebral infarction affecting left non-dominant side ([place]) 12/29/2018  . Peritoneal dialysis catheter in place [facility]) 11/14/2018  . Pre-transplant evaluation for kidney transplant 11/07/2018  . ESRD with anemia ([place])   . ESRD (end stage renal disease) on dialysis ([place])   . Grade I diastolic dysfunction 83/66/2947  . Arteriovenous fistula ([place]) 07/21/2018  . Type 2 diabetes mellitus with end-stage renal disease ([place]) 07/21/2018  . History of cerebrovascular accident (CVA) involving cerebellum 07/21/2018  . Cyst of left ovary 01/12/2018  . Dysmenorrhea 01/12/2018  . Insomnia 01/12/2018  . Menorrhagia 01/12/2018  . Vitamin D deficiency 01/02/2018  . Bilateral lower extremity edema 01/02/2018  . Reactive depression 12/10/2017  . Arthralgia of right temporomandibular joint 10/24/2017  . Diabetic ulcer of  right midfoot associated with type 2 diabetes mellitus, with fat layer exposed ([place]) 08/06/2017  . IBS (irritable bowel syndrome) 08/06/2017  . Antiplatelet or antithrombotic long-term use: Plavix 06/30/2017  . Morbid obesity due to excess calories ([place]) 11/16/2016  . Upper airway cough syndrome 11/15/2016  . DM (diabetes mellitus), type 2, uncontrolled, with renal complications ([place]) 65/46/5035  . Dyslipidemia associated with type 2 diabetes mellitus ([place]) 06/25/2016  . Uncontrolled type 2 diabetes mellitus with both eyes affected by proliferative retinopathy and macular edema, with long-term current use of insulin ([place]) 06/22/2016  . Uncontrolled type 2 diabetes mellitus with diabetic polyneuropathy, with long-term current use of insulin ([place]) 03/08/2016  . Proliferative diabetic retinopathy ([place]) 09/29/2015  . Constipation 09/03/2015  . Pseudophakia of both eyes 05/30/2015  . 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 B12 deficiency 05/10/2014  . Anemia associated with chronic renal failure, Procrit 04/26/2014  . CKD (chronic kidney disease) stage 5, GFR less than 15 ml/min (HCC) 04/26/2014  . Secondary hyperparathyroidism ([place]) 11/27/2013  . Posterior subcapsular cataract, bilateral 10/18/2013  . Diabetic neuropathy, painful ([place]), on low dose Gabapentin 07/13/2013  . Diabetes mellitus, type II, insulin dependent ([place]), on CGM, with end organ damage: renal, neuropathy, gastroparesis, retinopathy 02/19/2011  . Hypertension associated with diabetes ([place]) 02/19/2011  .  Gastroparesis due to DM ([place]) 02/19/2011    Past Medical History:  Diagnosis Date  . Antiplatelet or antithrombotic long-term use: Plavix 06/30/2017  . B12 deficiency 05/10/2014  . Blood transfusion without reported diagnosis   . Chronic constipation   . CVA (cerebral vascular accident) ([place]), nonhemorrhagic, inferior right cerebellum 12/03/2017  . Cyst of left ovary 01/12/2018  . Diabetic neuropathy, painful ([place]), on  low dose Gabapentin 07/13/2013  . DM (diabetes mellitus), type 2, uncontrolled, with renal complications ([place]) 86/57/8469  . DM2 (diabetes mellitus, type 2) ([place])   . Dysfunction of left eustachian tube, with pusatile tinnitus 11/24/2017  . Dyslipidemia associated with type 2 diabetes mellitus ([place]) 06/25/2016  . ESRD (end stage renal disease) on dialysis [facility])    On hemodialysis in July 2019 via [facility] then switched to [facility] in Nov 2019.   [name] ESRD with anemia ([place])   . Fibromyalgia   . Gastroparesis due to DM   . GERD (gastroesophageal reflux disease)   . Hypertension associated with diabetes ([place]) 02/19/2011  . IBS (irritable bowel syndrome)   . Left-sided weakness 12/10/2017   .  [name] Leiomyoma of uterus   . Pancreatitis   . Proliferative diabetic retinopathy ([place]) 09/29/2015  . 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 Reactive depression 12/10/2017   .  [name] Right-sided low back pain without sciatica 12/08/2015  . Secondary hyperthyroidism 11/27/2013  . Steal syndrome dialysis vascular access ([place]) 02/17/2018  . Thromboembolism ([place]) 04/05/2018  . Upper airway cough syndrome, with recs to stay off ACE and take Pepcid q hs 11/15/2016    Past Surgical History:  Procedure Laterality Date  . ARTERY REPAIR Right 02/17/2018   Procedure: EXPLORATION OF RIGHT BRACHIAL ARTERY;  Surgeon: Conrad Bruceton Mills, MD;  Location: [place];  Service: Vascular;  Laterality: Right;  . ARTERY REPAIR Right 02/17/2018   Procedure: BRACHIAL ARTERY EXPLORATION AND TRHOMBECTOMY;  Surgeon: Conrad Lafferty, MD;  Location: [facility] OR;  Service: Vascular;  Laterality: Right;  . AV FISTULA PLACEMENT Left 05/09/2017   Procedure: INSERTION OF ARTERIOVENOUS (AV) GRAFT ARM (ARTEGRAFT);  Surgeon: Conrad [place], MD;  Location: [facility] OR;  Service: Vascular;  Laterality: Left;  . AV FISTULA PLACEMENT Right 02/17/2018   Procedure: INSERTION OF ARTERIOVENOUS (AV) GORE-TEX GRAFT ARM RIGHT UPPER ARM;  Surgeon: Conrad [place]ville, MD;  Location: [place];   Service: Vascular;  Laterality: Right;  . BASCILIC VEIN TRANSPOSITION Left 08/31/2016   Procedure: BASILIC VEIN TRANSPOSITION FIRST STAGE;  Surgeon: Conrad Carlock, MD;  Location: [place];  Service: Vascular;  Laterality: Left;  . CENTRAL VENOUS CATHETER INSERTION Left 07/24/2018   Procedure: INSERTION CENTRAL LINE ADULT;  Surgeon: Conrad Timblin, MD;  Location: [place];  Service: Vascular;  Laterality: Left;  . EYE SURGERY     secondary to diabetic retinopathy   . FOOT SURGERY Right    t"ook bone out- maybe hammer toe"  . INSERTION OF DIALYSIS CATHETER Left 07/24/2018   Procedure: INSERTION OF TUNNELED DIALYSIS CATHETER;  Surgeon: Conrad Sharptown, MD;  Location: [place];  Service: Vascular;  Laterality: Left;  . IR FLUORO GUIDE CV LINE RIGHT  07/21/2018  . IR FLUORO GUIDE CV LINE RIGHT  06/01/2019  . IR US GUIDE VASC ACCESS RIGHT  07/21/2018  . IR US GUIDE VASC ACCESS RIGHT  06/01/2019  . LIGATION OF ARTERIOVENOUS  FISTULA Left 05/09/2017   Procedure: LIGATION OF ARTERIOVENOUS  FISTULA;  Surgeon: Conrad Wolfdale, MD;  Location: [place];  Service: Vascular;  Laterality: Left;  . LOOP RECORDER INSERTION N/A 12/05/2017   Procedure: LOOP RECORDER INSERTION;  Surgeon: Evans Lance, MD;  Location: [place] CV LAB;  Service: Cardiovascular;  Laterality: N/A;  . MYOMECTOMY    . REMOVAL OF GRAFT Right 02/17/2018   Procedure: REMOVAL OF RIGHT UPPER ARM ARTERIOVENOUS GRAFT;  Surgeon: Conrad Ontario, MD;  Location: [place];  Service: Vascular;  Laterality: Right;  . REVISION OF ARTERIOVENOUS GORETEX GRAFT Left 07/24/2018   Procedure: REDO ARTERIOVENOUS GORETEX GRAFT;  Surgeon: Conrad [place], MD;  Location: [place];  Service: Vascular;  Laterality: Left;  . TEE WITHOUT CARDIOVERSION N/A 12/05/2017   Procedure: TRANSESOPHAGEAL ECHOCARDIOGRAM (TEE);  Surgeon: Sanda Klein, MD;  Location: [facility] ENDOSCOPY;  Service: Cardiovascular;  Laterality: N/A;  . UPPER EXTREMITY VENOGRAPHY Left 07/13/2018   Procedure: UPPER EXTREMITY  VENOGRAPHY - Central & Left Arm;  Surgeon: Conrad [place]ville, MD;  Location: [place] CV LAB;  Service: Cardiovascular;  Laterality: Left;  . UPPER EXTREMITY VENOGRAPHY Bilateral 09/10/2019   Procedure: UPPER EXTREMITY VENOGRAPHY;  Surgeon: Waynetta Sandy, MD;  Location: [place] CV LAB;  Service: Cardiovascular;  Laterality: Bilateral;  . UTERINE FIBROID SURGERY    . VITRECTOMY Bilateral     Current Outpatient Medications  Medication Sig Dispense Refill  . aspirin-acetaminophen-caffeine (EXCEDRIN MIGRAINE) 250-250-65 MG tablet Take 1 tablet by mouth daily as needed for headache.    . B Complex-C-Zn-Folic Acid (DIALYVITE 161-WRUE 15) 0.8 MG TABS Take 1 tablet by mouth daily.    . calcium acetate (PHOSLO) 667 MG capsule Take 667 mg by mouth 3 (three) times daily with meals.     . carvedilol (COREG) 6.25 MG tablet     . Continuous Blood Gluc Sensor (FREESTYLE LIBRE 14 DAY SENSOR) MISC 1 patch by Does not apply route every 14 (fourteen) days. 6 each 1  . gabapentin (NEURONTIN) 100 MG capsule Take 3 capsules (300 mg total) by mouth as needed (pain).    [name] gabapentin (NEURONTIN) 300 MG capsule Take 300 mg by mouth 2 (two) times daily.    . insulin aspart (NOVOLOG FLEXPEN) 100 UNIT/ML FlexPen Inject 6-8 Units into the skin 3 (three) times daily with meals. Sliding scale    . insulin degludec (TRESIBA FLEXTOUCH) 100 UNIT/ML SOPN FlexTouch Pen Inject 28 Units into the skin at bedtime.     [name] lubiprostone (AMITIZA) 24 MCG capsule Take 1 capsule (24 mcg total) by mouth 2 (two) times daily with a meal.    . midodrine (PROAMATINE) 5 MG tablet TAKE 1 TABLET (5 MG TOTAL) BY MOUTH 2 (TWO) TIMES DAILY WITH A MEAL. (Patient taking differently: Take 5 mg by mouth daily as needed (hypotension). ) 180 tablet 0   No current facility-administered medications for this visit.      ALLERGIES: Ibuprofen, Dilaudid [hydromorphone hcl], and Tramadol  Family History  Problem Relation Age of Onset  . Colon  polyps Mother   . Diabetes Mother   . Heart murmur Father        history essentially unknown  . Hypertension Father   . Diabetes Sister   . Kidney failure Sister        kidney transplant  . Diabetes Brother   . Retinal degeneration Brother   . Heart murmur Son   . Stroke Paternal Grandmother   . Diabetes Sister     Social History   Socioeconomic History  . Marital status: Married    Spouse name: Not on file  .  Number of children: 1  . Years of education: college  . Highest education level: Not on file  Occupational History  . Occupation: Armed forces technical officer: [employer]  Social Needs  . Financial resource strain: Not on file  . Food insecurity    Worry: Not on file    Inability: Not on file  . Transportation needs    Medical: Not on file    Non-medical: Not on file  Tobacco Use  . Smoking status: Never Smoker  . Smokeless tobacco: Never Used  Substance and Sexual Activity  . Alcohol use: No    Alcohol/week: 0.0 standard drinks  . Drug use: No  . Sexual activity: Not Currently    Partners: Male    Birth control/protection: I.U.D.  Lifestyle  . Physical activity    Days per week: Not on file    Minutes per session: Not on file  . Stress: Not on file  Relationships  . Social Herbalist on phone: Not on file    Gets together: Not on file    Attends religious service: Not on file    Active member of club or organization: Not on file    Attends meetings of clubs or organizations: Not on file    Relationship status: Not on file  . Intimate partner violence    Fear of current or ex partner: Not on file    Emotionally abused: Not on file    Physically abused: Not on file    Forced sexual activity: Not on file  Other Topics Concern  . Not on file  Social History Narrative   Patient lives with fiance. She has 1 grown son. Works full-time, she Paramedic for attorney.   Caffeine Use: 2 cups daily; sodas occasionally   She has 7  grandchildren    ROS  PHYSICAL EXAMINATION:    There were no vitals taken for this visit.    General appearance: alert, cooperative and appears stated age Neck: no adenopathy, supple, symmetrical, trachea midline and thyroid {CHL AMB PHY EX THYROID NORM DEFAULT:[phone]::"normal to inspection and palpation"} Breasts: {Exam; breast:13139::"normal appearance, no masses or tenderness"} Abdomen: soft, non-tender; non distended, no masses,  no organomegaly  Pelvic: External genitalia:  no lesions              Urethra:  normal appearing urethra with no masses, tenderness or lesions              Bartholins and Skenes: normal                 Vagina: normal appearing vagina with normal color and discharge, no lesions              Cervix: {CHL AMB PHY EX CERVIX NORM [phone]::"no lesions"}              Bimanual Exam:  Uterus:  {CHL AMB PHY EX UTERUS NORM DEFAULT:[phone]::"normal size, contour, position, consistency, mobility, non-tender"}              Adnexa: {CHL AMB PHY EX ADNEXA NO MASS DEFAULT:[phone]::"no mass, fullness, tenderness"}              Rectovaginal: {yes no:[id number]}.  Confirms.              Anus:  normal sphincter tone, no lesions  Chaperone was present for exam.  ASSESSMENT     PLAN    An After Visit Summary was printed and given  to the patient.  *** minutes face to face time of which over 50% was spent in counseling.

## 2019-10-08 NOTE — Progress Notes (Signed)
Spoke with pt for pre-op call. Pt has a loop recorder and it was placed after she had a a stroke in 2018. Last check was in January of this year. She denies any other cardiac history. Pt is a type 2 diabetic. Last A1C in Epic was in June and it was 9.1. She thinks she has had one since then and it was 8.something. Pt states her fasting blood sugar is usually in the low 100's. Instructed pt to take 1/2 of regular dose of Tresiba Insulin on Tuesday PM (she will take 14 units). Instructed her not to take her bedtime dose of her Novolog insulin Tuesday night. Instructed pt to check her blood sugar when she gets up Wednesday AM and every 2 hour until she leaves for the hospital. If blood sugar is >220 take 1/2 of usual correction dose of Novolog insulin. If blood sugar is 70 or below, treat with 1/2 cup of clear juice (apple or cranberry) and recheck blood sugar 15 minutes after drinking juice. Instructed pt to let nurse know on arrival if she had to drink juice for her blood sugar. Pt voiced understanding.  Pt had her Covid test on 10/06/19 and it is negative. Pt states she has been in quarantine since the test was done.   Pt voiced understanding of visitation policy.

## 2019-10-09 ENCOUNTER — Other Ambulatory Visit: Payer: Managed Care, Other (non HMO) | Admitting: Obstetrics and Gynecology

## 2019-10-09 ENCOUNTER — Other Ambulatory Visit: Payer: Managed Care, Other (non HMO)

## 2019-10-09 ENCOUNTER — Ambulatory Visit (INDEPENDENT_AMBULATORY_CARE_PROVIDER_SITE_OTHER): Payer: Managed Care, Other (non HMO) | Admitting: Physician Assistant

## 2019-10-09 ENCOUNTER — Encounter: Payer: Self-pay | Admitting: Physician Assistant

## 2019-10-09 VITALS — BP 159/88 | HR 88 | Ht 68.0 in | Wt 229.0 lb

## 2019-10-09 DIAGNOSIS — R5381 Other malaise: Secondary | ICD-10-CM | POA: Diagnosis not present

## 2019-10-09 NOTE — Progress Notes (Signed)
Virtual Visit via Video   I connected with Heather [name] on 10/09/19 at 10:00 AM EDT by a video enabled telemedicine application and verified that I am speaking with the correct person using two identifiers. Location patient: Home Location provider: [facility], Office Persons participating in the virtual visit: Ericah, Scotto PA-C  I discussed the limitations of evaluation and management by telemedicine and the availability of in person appointments. The patient expressed understanding and agreed to proceed.  Subjective:   HPI:   Patient presents with a multitude of complaints.  Hx of non-compliant DM, ESRD on Tu/Th/Sat and hx of peritonitis requiring hospitalization. She states that she is having some malaise, lower pelvic pain, white vaginal discharge, cloudy urine, cough, congestion, chills.  She was recently tested for COVID on Saturday and had a negative test result.  [name] planning to have an AV graft put in tomorrow.   [name] concerned about her diabetes, states that she was dismissed from her endocrinologist, due to no-show x3.  She states that she did not show up to these appointments because she was advised by her nephrology team to not drive far distances because of her hypotension.  Her last A1c is from approximately 1 year ago and was 7%.     ROS: See pertinent positives and negatives per HPI.  Patient Active Problem List   Diagnosis Date Noted  . Pericardial effusion 06/14/2019  . PD catheter dysfunction ([place]) 05/31/2019  . ESRD on peritoneal dialysis ([place]) 04/25/2019  . History of peritonitis 03/04/2019  . Leucocytosis   . Neuropathic pain   . Renovascular hypertension   . Anemia of chronic disease   . Diabetes mellitus type 2 in obese ([place])   . ESRD on dialysis ([place])   . Bacterial peritonitis ([place])   . Debility 02/13/2019  . Peritonitis, dialysis-associated, initial encounter ([place]) 02/05/2019  . Peritonitis ([place]) 02/05/2019  .  Left ovarian cyst 02/05/2019  . Hemiplegia and hemiparesis following cerebral infarction affecting left non-dominant side ([place]) 12/29/2018  . Peritoneal dialysis catheter in place [facility]) 11/14/2018  . Pre-transplant evaluation for kidney transplant 11/07/2018  . ESRD with anemia ([place])   . ESRD (end stage renal disease) on dialysis ([place])   . Grade I diastolic dysfunction 27/74/1287  . Arteriovenous fistula ([place]) 07/21/2018  . Type 2 diabetes mellitus with end-stage renal disease ([place]) 07/21/2018  . History of cerebrovascular accident (CVA) involving cerebellum 07/21/2018  . Cyst of left ovary 01/12/2018  . Dysmenorrhea 01/12/2018  . Insomnia 01/12/2018  . Menorrhagia 01/12/2018  . Vitamin D deficiency 01/02/2018  . Bilateral lower extremity edema 01/02/2018  . Reactive depression 12/10/2017  . Arthralgia of right temporomandibular joint 10/24/2017  . Diabetic ulcer of right midfoot associated with type 2 diabetes mellitus, with fat layer exposed ([place]) 08/06/2017  . IBS (irritable bowel syndrome) 08/06/2017  . Antiplatelet or antithrombotic long-term use: Plavix 06/30/2017  . Morbid obesity due to excess calories ([place]) 11/16/2016  . Upper airway cough syndrome 11/15/2016  . DM (diabetes mellitus), type 2, uncontrolled, with renal complications ([place]) 86/76/7209  . Dyslipidemia associated with type 2 diabetes mellitus ([place]) 06/25/2016  . Uncontrolled type 2 diabetes mellitus with both eyes affected by proliferative retinopathy and macular edema, with long-term current use of insulin ([place]) 06/22/2016  . Uncontrolled type 2 diabetes mellitus with diabetic polyneuropathy, with long-term current use of insulin ([place]) 03/08/2016  . Proliferative diabetic retinopathy ([place]) 09/29/2015  . Constipation 09/03/2015  . Pseudophakia of both  eyes 05/30/2015  . 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 B12 deficiency 05/10/2014  . Anemia associated with chronic renal failure, Procrit 04/26/2014  .  CKD (chronic kidney disease) stage 5, GFR less than 15 ml/min (HCC) 04/26/2014  . Secondary hyperparathyroidism ([place]) 11/27/2013  . Posterior subcapsular cataract, bilateral 10/18/2013  . Diabetic neuropathy, painful ([place]), on low dose Gabapentin 07/13/2013  . Diabetes mellitus, type II, insulin dependent ([place]), on CGM, with end organ damage: renal, neuropathy, gastroparesis, retinopathy 02/19/2011  . Hypertension associated with diabetes ([place]) 02/19/2011  . Gastroparesis due to DM ([place]) 02/19/2011    Social History   Tobacco Use  . Smoking status: Never Smoker  . Smokeless tobacco: Never Used  Substance Use Topics  . Alcohol use: No    Alcohol/week: 0.0 standard drinks    Current Outpatient Medications:  .  aspirin-acetaminophen-caffeine (EXCEDRIN MIGRAINE) 250-250-65 MG tablet, Take 1 tablet by mouth daily as needed for headache., Disp: , Rfl:  .  calcium acetate (PHOSLO) 667 MG capsule, Take 667 mg by mouth 3 (three) times daily with meals. , Disp: , Rfl:  .  carvedilol (COREG) 12.5 MG tablet, Take 6.25 mg by mouth daily. , Disp: , Rfl:  .  Continuous Blood Gluc Sensor (FREESTYLE LIBRE 14 DAY SENSOR) MISC, 1 patch by Does not apply route every 14 (fourteen) days., Disp: 6 each, Rfl: 1 .  gabapentin (NEURONTIN) 100 MG capsule, Take 3 capsules (300 mg total) by mouth as needed (pain)., Disp: , Rfl:  .  gabapentin (NEURONTIN) 300 MG capsule, Take 300 mg by mouth 2 (two) times daily as needed. , Disp: , Rfl:  .  insulin aspart (NOVOLOG FLEXPEN) 100 UNIT/ML FlexPen, Inject 6-8 Units into the skin 2 (two) times daily. Sliding scale, Disp: , Rfl:  .  insulin degludec (TRESIBA FLEXTOUCH) 100 UNIT/ML SOPN FlexTouch Pen, Inject 28 Units into the skin at bedtime. , Disp: , Rfl:  .  lubiprostone (AMITIZA) 24 MCG capsule, Take 1 capsule (24 mcg total) by mouth 2 (two) times daily with a meal. (Patient taking differently: Take 24 mcg by mouth as needed for constipation. ), Disp: , Rfl:  .  midodrine  (PROAMATINE) 5 MG tablet, TAKE 1 TABLET (5 MG TOTAL) BY MOUTH 2 (TWO) TIMES DAILY WITH A MEAL. (Patient taking differently: Take 5 mg by mouth daily as needed (hypotension). ), Disp: 180 tablet, Rfl: 0  Allergies  Allergen Reactions  . Ibuprofen Other (See Comments)    CKD stage 3. Should avoid.  . Dilaudid [Hydromorphone Hcl] Itching and Other (See Comments)    Can take with Benadryl.  . Tramadol Itching    Objective:   VITALS: Per patient if applicable, see vitals. GENERAL: Alert, appears well and in no acute distress. HEENT: Atraumatic, conjunctiva clear, no obvious abnormalities on inspection of external nose and ears. NECK: Normal movements of the head and neck. CARDIOPULMONARY: No increased WOB. Speaking in clear sentences. I:E ratio WNL.  MS: Moves all visible extremities without noticeable abnormality. PSYCH: Pleasant and cooperative, well-groomed. Speech normal rate and rhythm. Affect is appropriate. Insight and judgement are appropriate. Attention is focused, linear, and appropriate.  NEURO: CN grossly intact. Oriented as arrived to appointment on time with no prompting. Moves both UE equally.  SKIN: No obvious lesions, wounds, erythema, or cyanosis noted on face or hands.  Assessment and Plan:   Janelly was seen today for vaginal discharge.  Diagnoses and all orders for this visit:  Malaise   Unclear etiology. Due  to multitude of symptoms and complex medical history, feel patient would best be served at either Urgent Care or ER. Reviewed with PCP who was also in agreement. Patient verbalized understanding and was agreeable.  . Reviewed expectations re: course of current medical issues. . Discussed self-management of symptoms. . Outlined signs and symptoms indicating need for more acute intervention. . Patient verbalized understanding and all questions were answered. [name] Health Maintenance issues including appropriate healthy diet, exercise, and smoking avoidance were  discussed with patient. . See orders for this visit as documented in the electronic medical record.  I discussed the assessment and treatment plan with the patient. The patient was provided an opportunity to ask questions and all were answered. The patient agreed with the plan and demonstrated an understanding of the instructions.   The patient was advised to call back or seek an in-person evaluation if the symptoms worsen or if the condition fails to improve as anticipated.  [place], [place] 10/09/2019

## 2019-10-09 NOTE — Anesthesia Preprocedure Evaluation (Addendum)
Anesthesia Evaluation  Patient identified by MRN, date of birth, ID band Patient awake    Reviewed: Allergy & Precautions, NPO status , Patient's Chart, lab work & pertinent test results  History of Anesthesia Complications Negative for: history of anesthetic complications  Airway Mallampati: II  TM Distance: >3 FB Neck ROM: Full    Dental  (+) Teeth Intact, Missing,    Pulmonary neg pulmonary ROS,    Pulmonary exam normal        Cardiovascular hypertension, Pt. on medications and Pt. on home beta blockers Normal cardiovascular exam     Neuro/Psych CVA negative psych ROS   GI/Hepatic Neg liver ROS, GERD  ,Diabetic gastroparesis   Endo/Other  diabetes, Poorly Controlled, Type 2, Insulin Dependent  Renal/GU ESRF and DialysisRenal disease  negative genitourinary   Musculoskeletal  (+) Fibromyalgia -  Abdominal   Peds  Hematology  (+) anemia ,   Anesthesia Other Findings Echo 02/08/19: EF 60-65%, impaired diastolic relaxation, normal valves  Normal myoview 2016  Reproductive/Obstetrics                           Anesthesia Physical Anesthesia Plan  ASA: IV  Anesthesia Plan: General   Post-op Pain Management:    Induction: Intravenous, Rapid sequence and Cricoid pressure planned  PONV Risk Score and Plan: 3 and Ondansetron, Dexamethasone, Treatment may vary due to age or medical condition and Midazolam  Airway Management Planned: Oral ETT  Additional Equipment: None  Intra-op Plan:   Post-operative Plan: Extubation in OR  Informed Consent: I have reviewed the patients History and Physical, chart, labs and discussed the procedure including the risks, benefits and alternatives for the proposed anesthesia with the patient or authorized representative who has indicated his/her understanding and acceptance.     Dental advisory given  Plan Discussed with:   Anesthesia Plan  Comments:        Anesthesia Quick Evaluation

## 2019-10-10 ENCOUNTER — Encounter (HOSPITAL_COMMUNITY)
Admission: RE | Disposition: A | Discharge status: Home / Self Care | Payer: Self-pay | Source: Home / Self Care | Attending: Vascular Surgery

## 2019-10-10 ENCOUNTER — Ambulatory Visit (HOSPITAL_COMMUNITY)
Admission: RE | Admit: 2019-10-10 | Discharge: 2019-10-10 | Disposition: A | Discharge status: Home / Self Care | Payer: Managed Care, Other (non HMO) | Attending: Vascular Surgery | Admitting: Vascular Surgery

## 2019-10-10 ENCOUNTER — Encounter (HOSPITAL_COMMUNITY): Payer: Self-pay

## 2019-10-10 ENCOUNTER — Ambulatory Visit (HOSPITAL_COMMUNITY): Payer: Managed Care, Other (non HMO) | Admitting: Anesthesiology

## 2019-10-10 ENCOUNTER — Other Ambulatory Visit: Payer: Self-pay

## 2019-10-10 DIAGNOSIS — K589 Irritable bowel syndrome without diarrhea: Secondary | ICD-10-CM | POA: Insufficient documentation

## 2019-10-10 DIAGNOSIS — M797 Fibromyalgia: Secondary | ICD-10-CM | POA: Insufficient documentation

## 2019-10-10 DIAGNOSIS — Z794 Long term (current) use of insulin: Secondary | ICD-10-CM | POA: Insufficient documentation

## 2019-10-10 DIAGNOSIS — I120 Hypertensive chronic kidney disease with stage 5 chronic kidney disease or end stage renal disease: Secondary | ICD-10-CM | POA: Diagnosis not present

## 2019-10-10 DIAGNOSIS — K3184 Gastroparesis: Secondary | ICD-10-CM | POA: Insufficient documentation

## 2019-10-10 DIAGNOSIS — N185 Chronic kidney disease, stage 5: Secondary | ICD-10-CM

## 2019-10-10 DIAGNOSIS — Z992 Dependence on renal dialysis: Secondary | ICD-10-CM | POA: Diagnosis not present

## 2019-10-10 DIAGNOSIS — E1143 Type 2 diabetes mellitus with diabetic autonomic (poly)neuropathy: Secondary | ICD-10-CM | POA: Diagnosis not present

## 2019-10-10 DIAGNOSIS — Z8673 Personal history of transient ischemic attack (TIA), and cerebral infarction without residual deficits: Secondary | ICD-10-CM | POA: Diagnosis not present

## 2019-10-10 DIAGNOSIS — N186 End stage renal disease: Secondary | ICD-10-CM | POA: Insufficient documentation

## 2019-10-10 DIAGNOSIS — E1122 Type 2 diabetes mellitus with diabetic chronic kidney disease: Secondary | ICD-10-CM | POA: Insufficient documentation

## 2019-10-10 DIAGNOSIS — Z885 Allergy status to narcotic agent status: Secondary | ICD-10-CM | POA: Insufficient documentation

## 2019-10-10 DIAGNOSIS — Z79899 Other long term (current) drug therapy: Secondary | ICD-10-CM | POA: Diagnosis not present

## 2019-10-10 DIAGNOSIS — E11319 Type 2 diabetes mellitus with unspecified diabetic retinopathy without macular edema: Secondary | ICD-10-CM | POA: Insufficient documentation

## 2019-10-10 DIAGNOSIS — Z452 Encounter for adjustment and management of vascular access device: Secondary | ICD-10-CM | POA: Diagnosis not present

## 2019-10-10 HISTORY — PX: AV FISTULA PLACEMENT: SHX1204

## 2019-10-10 LAB — URINALYSIS, ROUTINE W REFLEX MICROSCOPIC
Bilirubin Urine: NEGATIVE
Glucose, UA: NEGATIVE mg/dL
Ketones, ur: NEGATIVE mg/dL
Nitrite: NEGATIVE
Protein, ur: >=300 mg/dL — AB
Specific Gravity, Urine: 1.014 (ref 1.005–1.030)
WBC, UA: >50 WBC/hpf — ABNORMAL HIGH (ref 0–5)
pH: 5 (ref 5.0–8.0)

## 2019-10-10 LAB — POCT I-STAT, CHEM 8
BUN: 22 mg/dL — ABNORMAL HIGH (ref 6–20)
Calcium, Ion: 1.04 mmol/L — ABNORMAL LOW (ref 1.15–1.40)
Chloride: 99 mmol/L (ref 98–111)
Creatinine, Ser: 6.1 mg/dL — ABNORMAL HIGH (ref 0.44–1.00)
Glucose, Bld: 219 mg/dL — ABNORMAL HIGH (ref 70–99)
HCT: 34 % — ABNORMAL LOW (ref 36.0–46.0)
Hemoglobin: 11.6 g/dL — ABNORMAL LOW (ref 12.0–15.0)
Potassium: 4.4 mmol/L (ref 3.5–5.1)
Sodium: 134 mmol/L — ABNORMAL LOW (ref 135–145)
TCO2: 27 mmol/L (ref 22–32)

## 2019-10-10 LAB — POCT PREGNANCY, URINE: Preg Test, Ur: NEGATIVE

## 2019-10-10 LAB — GLUCOSE, CAPILLARY
Glucose-Capillary: 201 mg/dL — ABNORMAL HIGH (ref 70–99)
Glucose-Capillary: 268 mg/dL — ABNORMAL HIGH (ref 70–99)

## 2019-10-10 SURGERY — INSERTION OF ARTERIOVENOUS (AV) GORE-TEX GRAFT ARM
Anesthesia: General | Site: Arm Upper | Laterality: Left

## 2019-10-10 MED ORDER — LIDOCAINE 2% (20 MG/ML) 5 ML SYRINGE
INTRAMUSCULAR | Status: DC | PRN
  Administered 2019-10-10: 100 mg via INTRAVENOUS

## 2019-10-10 MED ORDER — SODIUM CHLORIDE 0.9 % IV SOLN
INTRAVENOUS | Status: AC
  Filled 2019-10-10: qty 1.2

## 2019-10-10 MED ORDER — DIPHENHYDRAMINE HCL 50 MG/ML IJ SOLN
INTRAMUSCULAR | Status: AC
  Filled 2019-10-10: qty 1

## 2019-10-10 MED ORDER — CHLORHEXIDINE GLUCONATE 4 % EX LIQD: 60.0000 mL | Freq: Once | CUTANEOUS | Status: DC

## 2019-10-10 MED ORDER — CEFAZOLIN SODIUM-DEXTROSE 2-4 GM/100ML-% IV SOLN
INTRAVENOUS | Status: AC
  Filled 2019-10-10: qty 100

## 2019-10-10 MED ORDER — ONDANSETRON HCL 4 MG/2ML IJ SOLN
INTRAMUSCULAR | Status: DC | PRN
  Administered 2019-10-10: 4 mg via INTRAVENOUS

## 2019-10-10 MED ORDER — ONDANSETRON HCL 4 MG/2ML IJ SOLN
INTRAMUSCULAR | Status: AC
  Filled 2019-10-10: qty 2

## 2019-10-10 MED ORDER — DIPHENHYDRAMINE HCL 50 MG/ML IJ SOLN
12.5000 mg | Freq: Once | INTRAMUSCULAR | Status: AC
  Administered 2019-10-10: 10:00:00 12.5 mg via INTRAVENOUS

## 2019-10-10 MED ORDER — SODIUM CHLORIDE 0.9 % IV SOLN
INTRAVENOUS | Status: DC
  Administered 2019-10-10: 08:00:00 via INTRAVENOUS

## 2019-10-10 MED ORDER — PHENYLEPHRINE 40 MCG/ML (10ML) SYRINGE FOR IV PUSH (FOR BLOOD PRESSURE SUPPORT)
PREFILLED_SYRINGE | INTRAVENOUS | Status: AC
  Filled 2019-10-10: qty 10

## 2019-10-10 MED ORDER — SODIUM CHLORIDE 0.9 % IV SOLN
INTRAVENOUS | Status: DC | PRN
  Administered 2019-10-10: 500 mL

## 2019-10-10 MED ORDER — CARVEDILOL 3.125 MG PO TABS
6.2500 mg | ORAL_TABLET | Freq: Once | ORAL | Status: AC
  Administered 2019-10-10: 6.25 mg via ORAL
  Filled 2019-10-10: qty 2

## 2019-10-10 MED ORDER — FENTANYL CITRATE (PF) 250 MCG/5ML IJ SOLN
INTRAMUSCULAR | Status: AC
  Filled 2019-10-10: qty 5

## 2019-10-10 MED ORDER — ROCURONIUM BROMIDE 10 MG/ML (PF) SYRINGE
PREFILLED_SYRINGE | INTRAVENOUS | Status: AC
  Filled 2019-10-10: qty 10

## 2019-10-10 MED ORDER — 0.9 % SODIUM CHLORIDE (POUR BTL) OPTIME
TOPICAL | Status: DC | PRN
  Administered 2019-10-10: 1000 mL

## 2019-10-10 MED ORDER — OXYCODONE-ACETAMINOPHEN 5-325 MG PO TABS
1.0000 | ORAL_TABLET | Freq: Four times a day (QID) | ORAL | Status: DC | PRN
  Administered 2019-10-10: 1 via ORAL

## 2019-10-10 MED ORDER — ONDANSETRON HCL 4 MG/2ML IJ SOLN: 4.0000 mg | Freq: Once | INTRAMUSCULAR | Status: DC | PRN

## 2019-10-10 MED ORDER — PROPOFOL 10 MG/ML IV BOLUS
INTRAVENOUS | Status: AC
  Filled 2019-10-10: qty 20

## 2019-10-10 MED ORDER — OXYCODONE HCL 5 MG/5ML PO SOLN: 5.0000 mg | Freq: Once | ORAL | Status: DC | PRN

## 2019-10-10 MED ORDER — DEXAMETHASONE SODIUM PHOSPHATE 10 MG/ML IJ SOLN
INTRAMUSCULAR | Status: AC
  Filled 2019-10-10: qty 1

## 2019-10-10 MED ORDER — OXYCODONE HCL 5 MG PO TABS: 5.0000 mg | ORAL_TABLET | Freq: Once | ORAL | Status: DC | PRN

## 2019-10-10 MED ORDER — LIDOCAINE 2% (20 MG/ML) 5 ML SYRINGE
INTRAMUSCULAR | Status: AC
  Filled 2019-10-10: qty 5

## 2019-10-10 MED ORDER — CEFAZOLIN SODIUM-DEXTROSE 2-4 GM/100ML-% IV SOLN
2.0000 g | INTRAVENOUS | Status: AC
  Administered 2019-10-10: 2 g via INTRAVENOUS

## 2019-10-10 MED ORDER — SUCCINYLCHOLINE CHLORIDE 200 MG/10ML IV SOSY
PREFILLED_SYRINGE | INTRAVENOUS | Status: DC | PRN
  Administered 2019-10-10: 100 mg via INTRAVENOUS

## 2019-10-10 MED ORDER — SODIUM CHLORIDE 0.9 % IV SOLN
INTRAVENOUS | Status: DC | PRN
  Administered 2019-10-10: 60 ug/min via INTRAVENOUS

## 2019-10-10 MED ORDER — DEXAMETHASONE SODIUM PHOSPHATE 10 MG/ML IJ SOLN
INTRAMUSCULAR | Status: DC | PRN
  Administered 2019-10-10: 4 mg via INTRAVENOUS

## 2019-10-10 MED ORDER — PROPOFOL 10 MG/ML IV BOLUS
INTRAVENOUS | Status: DC | PRN
  Administered 2019-10-10: 120 mg via INTRAVENOUS

## 2019-10-10 MED ORDER — MIDAZOLAM HCL 5 MG/5ML IJ SOLN
INTRAMUSCULAR | Status: DC | PRN
  Administered 2019-10-10 (×2): 1 mg via INTRAVENOUS

## 2019-10-10 MED ORDER — FENTANYL CITRATE (PF) 100 MCG/2ML IJ SOLN: 25.0000 ug | INTRAMUSCULAR | Status: DC | PRN

## 2019-10-10 MED ORDER — SUCCINYLCHOLINE CHLORIDE 200 MG/10ML IV SOSY
PREFILLED_SYRINGE | INTRAVENOUS | Status: AC
  Filled 2019-10-10: qty 10

## 2019-10-10 MED ORDER — PHENYLEPHRINE 40 MCG/ML (10ML) SYRINGE FOR IV PUSH (FOR BLOOD PRESSURE SUPPORT)
PREFILLED_SYRINGE | INTRAVENOUS | Status: DC | PRN
  Administered 2019-10-10: 40 ug via INTRAVENOUS
  Administered 2019-10-10: 80 ug via INTRAVENOUS
  Administered 2019-10-10: 40 ug via INTRAVENOUS

## 2019-10-10 MED ORDER — FENTANYL CITRATE (PF) 100 MCG/2ML IJ SOLN
INTRAMUSCULAR | Status: DC | PRN
  Administered 2019-10-10: 100 ug via INTRAVENOUS
  Administered 2019-10-10 (×3): 50 ug via INTRAVENOUS

## 2019-10-10 MED ORDER — LIDOCAINE-EPINEPHRINE (PF) 1 %-1:200000 IJ SOLN
INTRAMUSCULAR | Status: AC
  Filled 2019-10-10: qty 30

## 2019-10-10 MED ORDER — OXYCODONE-ACETAMINOPHEN 5-325 MG PO TABS
ORAL_TABLET | ORAL | Status: AC
  Filled 2019-10-10: qty 1

## 2019-10-10 MED ORDER — MIDAZOLAM HCL 2 MG/2ML IJ SOLN
INTRAMUSCULAR | Status: AC
  Filled 2019-10-10: qty 2

## 2019-10-10 MED ORDER — OXYCODONE-ACETAMINOPHEN 5-325 MG PO TABS: 1.0000 | ORAL_TABLET | Freq: Four times a day (QID) | ORAL | 0 refills | Status: DC | PRN

## 2019-10-10 SURGICAL SUPPLY — 37 items
ARMBAND PINK RESTRICT EXTREMIT (MISCELLANEOUS) ×2 IMPLANT
CANISTER SUCT 3000ML PPV (MISCELLANEOUS) ×2 IMPLANT
CLIP VESOCCLUDE MED 6/CT (CLIP) ×2 IMPLANT
CLIP VESOCCLUDE SM WIDE 6/CT (CLIP) ×2 IMPLANT
COVER WAND RF STERILE (DRAPES) IMPLANT
DERMABOND ADVANCED (GAUZE/BANDAGES/DRESSINGS) ×1
DERMABOND ADVANCED .7 DNX12 (GAUZE/BANDAGES/DRESSINGS) ×1 IMPLANT
ELECT REM PT RETURN 9FT ADLT (ELECTROSURGICAL) ×2
ELECTRODE REM PT RTRN 9FT ADLT (ELECTROSURGICAL) ×1 IMPLANT
GLOVE BIO SURGEON STRL SZ7.5 (GLOVE) ×2 IMPLANT
GLOVE ECLIPSE 7.0 STRL STRAW (GLOVE) ×2 IMPLANT
GLOVE INDICATOR 6.5 STRL GRN (GLOVE) ×6 IMPLANT
GLOVE INDICATOR 7.5 STRL GRN (GLOVE) ×2 IMPLANT
GLOVE SS BIOGEL STRL SZ 6.5 (GLOVE) ×1 IMPLANT
GLOVE SUPERSENSE BIOGEL SZ 6.5 (GLOVE) ×1
GOWN STRL REUS W/ TWL LRG LVL3 (GOWN DISPOSABLE) ×2 IMPLANT
GOWN STRL REUS W/ TWL XL LVL3 (GOWN DISPOSABLE) ×1 IMPLANT
GOWN STRL REUS W/TWL LRG LVL3 (GOWN DISPOSABLE) ×2
GOWN STRL REUS W/TWL XL LVL3 (GOWN DISPOSABLE) ×1
GRAFT COLLAGEN VASCULAR 7X40 (Vascular Products) ×2 IMPLANT
HEMOSTAT SNOW SURGICEL 2X4 (HEMOSTASIS) IMPLANT
INSERT FOGARTY SM (MISCELLANEOUS) ×2 IMPLANT
KIT BASIN OR (CUSTOM PROCEDURE TRAY) ×2 IMPLANT
KIT TURNOVER KIT B (KITS) ×2 IMPLANT
NS IRRIG 1000ML POUR BTL (IV SOLUTION) ×4 IMPLANT
PACK CV ACCESS (CUSTOM PROCEDURE TRAY) ×2 IMPLANT
PAD ARMBOARD 7.5X6 YLW CONV (MISCELLANEOUS) ×4 IMPLANT
SUT MNCRL AB 4-0 PS2 18 (SUTURE) ×4 IMPLANT
SUT PROLENE 5 0 C 1 24 (SUTURE) ×2 IMPLANT
SUT PROLENE 6 0 BV (SUTURE) ×4 IMPLANT
SUT SILK 2 0 SH (SUTURE) ×2 IMPLANT
SUT VIC AB 3-0 SH 27 (SUTURE) ×2
SUT VIC AB 3-0 SH 27X BRD (SUTURE) ×2 IMPLANT
SYR TOOMEY 50ML (SYRINGE) ×2 IMPLANT
TOWEL GREEN STERILE (TOWEL DISPOSABLE) ×2 IMPLANT
UNDERPAD 30X30 (UNDERPADS AND DIAPERS) ×2 IMPLANT
WATER STERILE IRR 1000ML POUR (IV SOLUTION) ×2 IMPLANT

## 2019-10-10 NOTE — Discharge Instructions (Addendum)
° °  [facility] [place] ° °Discharge Instructions ° °AV Fistula or Graft Surgery for Dialysis Access ° °Please refer to the following instructions for your post-procedure care. Your surgeon or physician assistant will discuss any changes with you. ° °Activity ° °You may drive the day following your surgery, if you are comfortable and no longer taking prescription pain medication. Resume full activity as the soreness in your incision resolves. ° °Bathing/Showering ° °You may shower after you go home. Keep your incision dry for 48 hours. Do not soak in a bathtub, hot tub, or swim until the incision heals completely. You may not shower if you have a hemodialysis catheter. ° °Incision Care ° °Clean your incision with mild soap and water after 48 hours. Pat the area dry with a clean towel. You do not need a bandage unless otherwise instructed. Do not apply any ointments or creams to your incision. You may have skin glue on your incision. Do not peel it off. It will come off on its own in about one week. Your arm may swell a bit after surgery. To reduce swelling use pillows to elevate your arm so it is above your heart. Your doctor will tell you if you need to lightly wrap your arm with an ACE bandage. ° °Diet ° °Resume your normal diet. There are not special food restrictions following this procedure. In order to heal from your surgery, it is CRITICAL to get adequate nutrition. Your body requires vitamins, minerals, and protein. Vegetables are the best source of vitamins and minerals. Vegetables also provide the perfect balance of protein. Processed food has little nutritional value, so try to avoid this. ° °Medications ° °Resume taking all of your medications. If your incision is causing pain, you may take over-the counter pain relievers such as acetaminophen (Tylenol). If you were prescribed a stronger pain medication, please be aware these medications can cause nausea and constipation. Prevent  nausea by taking the medication with a snack or meal. Avoid constipation by drinking plenty of fluids and eating foods with high amount of fiber, such as fruits, vegetables, and grains. Do not take Tylenol if you are taking prescription pain medications. ° ° ° ° °Follow up °Your surgeon may want to see you in the office following your access surgery. If so, this will be arranged at the time of your surgery. ° °Please call us immediately for any of the following conditions: ° °Increased pain, redness, drainage (pus) from your incision site °Fever of 101 degrees or higher °Severe or worsening pain at your incision site °Hand pain or numbness. ° °Reduce your risk of vascular disease: ° °Stop smoking. If you would like help, call [facility] at 1-800-QUIT-NOW (1-800-784-8669) or [place] at [phone] ° °Manage your cholesterol °Maintain a desired weight °Control your diabetes °Keep your blood pressure down ° °Dialysis ° °It will take several weeks to several months for your new dialysis access to be ready for use. Your surgeon will determine when it is OK to use it. Your nephrologist will continue to direct your dialysis. You can continue to use your Permcath until your new access is ready for use. ° °If you have any questions, please call the office at [phone]. ° °

## 2019-10-10 NOTE — Anesthesia Postprocedure Evaluation (Signed)
Anesthesia Post Note  Patient: Heather [name]  Procedure(s) Performed: INSERTION OF LEFT ARTERIOVENOUS (AV) ARTEGRAFT GRAFT ARM (Left Arm Upper)     Patient location during evaluation: PACU Anesthesia Type: General Level of consciousness: awake and alert Pain management: pain level controlled Vital Signs Assessment: post-procedure vital signs reviewed and stable Respiratory status: spontaneous breathing, nonlabored ventilation and respiratory function stable Cardiovascular status: blood pressure returned to baseline and stable Postop Assessment: no apparent nausea or vomiting Anesthetic complications: no    Last Vitals:  Vitals:   10/10/19 1110 10/10/19 1125  BP: (!) 177/85 (!) 152/80  Pulse: 89 88  Resp: 18 17  Temp:  (!) 36.2 C  SpO2: 94% 99%    Last Pain:  Vitals:   10/10/19 1110  TempSrc:   PainSc: 7                  Leydy Worthey E Kaylin Marcon

## 2019-10-10 NOTE — Transfer of Care (Signed)
Immediate Anesthesia Transfer of Care Note  Patient: Heather [name]  Procedure(s) Performed: INSERTION OF LEFT ARTERIOVENOUS (AV) ARTEGRAFT GRAFT ARM (Left Arm Upper)  Patient Location: PACU  Anesthesia Type:General  Level of Consciousness: awake, oriented and patient cooperative  Airway & Oxygen Therapy: Patient Spontanous Breathing and Patient connected to face mask oxygen  Post-op Assessment: Report given to RN and Post -op Vital signs reviewed and stable  Post vital signs: Reviewed and stable  Last Vitals:  Vitals Value Taken Time  BP 194/100 10/10/19 1010  Temp    Pulse 74 10/10/19 1006  Resp 17 10/10/19 1010  SpO2 85 % 10/10/19 1006  Vitals shown include unvalidated device data.  Last Pain:  Vitals:   10/10/19 0644  TempSrc:   PainSc: 0-No pain         Complications: No apparent anesthesia complications

## 2019-10-10 NOTE — Progress Notes (Signed)
Patient c/o itching in her lower legs and feet. No redness or skin changes noted. MD at bedside and MDA notified. Benadryl ordered. Will continue to monitor.

## 2019-10-10 NOTE — Anesthesia Procedure Notes (Signed)
Procedure Name: Intubation Date/Time: 10/10/2019 8:33 AM Performed by: Orlie Dakin, CRNA Pre-anesthesia Checklist: Patient identified, Emergency Drugs available, Suction available and Patient being monitored Patient Re-evaluated:Patient Re-evaluated prior to induction Oxygen Delivery Method: Circle system utilized Preoxygenation: Pre-oxygenation with 100% oxygen Induction Type: IV induction, Rapid sequence and Cricoid Pressure applied Laryngoscope Size: Mac and 4 Grade View: Grade I Tube type: Oral Tube size: 7.0 mm Number of attempts: 1 Airway Equipment and Method: Stylet Placement Confirmation: ETT inserted through vocal cords under direct vision,  positive ETCO2 and breath sounds checked- equal and bilateral Secured at: 23 cm Tube secured with: Tape Dental Injury: Teeth and Oropharynx as per pre-operative assessment  Comments: 4x4s bite block used.

## 2019-10-10 NOTE — Op Note (Signed)
° ° °  Patient name: Heather [name] MRN: [id number] DOB: Jul 09, 1971 Sex: female  10/10/2019 Pre-operative Diagnosis: End-stage renal disease Post-operative diagnosis:  Same Surgeon:  Erlene Quan C. Donzetta Matters, MD Assistant: Laurence Slate, PA Procedure Performed:  Redo left upper arm AV graft brachial artery to axillary vein with 7 mm Artegraft and and vein configuration.  Indications: 48 year old female with multiple left upper extremity AV access procedures that have now failed.  She has undergone venography which demonstrates central patency on the left.  [name] indicated for repeat vision grafting with Artegraft left upper extremity.  Findings: Axillary vein was patent.  We transected it centrally.  The brachial artery I had a performed limited endarterectomy took the artery back to just a very little cuff of previous graft.  At completion there was good thrill in the Artegraft there was a good signal at the wrist.  The radial artery at the wrist was palpable with compression of the Artegraft.   Procedure:  The patient was identified in the holding area and taken to the operating room where she placed upon operative general anesthesia induced.  She was sterilely prepped draped left upper extremity usual fashion antibiotics were minister timeout was called.  We began using ultrasound to identify initially basilic vein this was noted to be thrombosed.  We identified the axillary vein.  We made a longitudinal incision axilla where her previous incision was.  We did extend the cephalad.  We dissected down to the axillary vein.  We did divided branches tween ties.  We marked this for orientation.  We then made an incision near the previous chart serial incision just above the antecubitum.  We dissected down to identify the graft.  We dissected out the artery proximally and distally placed Vesseloops around all these.  We then tunneled 7 mm Artegraft.  In the axilla we clamped the axillary vein tied off the vein  peripherally centrally we spatulated it flushed with heparinized saline.  We then spatulated our Artegraft and sewed end-to-end with 6-0 Prolene suture.  Possibly we flushed through the graft.  The graft to been marked orientation.  We then clamped our distal artery followed by proximal graft.  We transected the graft.  This was totally thrombosed preformed endarterectomy down into the artery.  We did leave a small cuff of graft.  We flushed the artery with heparinized saline both directions.  We then sewed the graft into side with 6-0 Prolene suture.  This time prior to completion we allowed flushing all directions.  Upon completion we did have good thrill in the graft.  We irrigated all of our incisions obtain hemostasis.  We did good signal in our axillary vein runoff.  We could palpate a radial artery with compression of the graft he did have good signal with and without disruption of the graft.  Satisfied we closed in layers Vicryl Monocryl.  Dermabond placed to level skin.  [name] awakened anesthesia having tolerated procedure without immediate complication.  Counts were correct at completion.  EBL: 50 cc   Tsuruko Murtha C. Donzetta Matters, MD [facility] Office: [phone] Pager: [phone]

## 2019-10-10 NOTE — Progress Notes (Signed)
Elevated BP, Dr. Christella Hartigan made aware. No orders given, will continue to monitor. Patient resting comfortably, no c/o headaches, blurry vision, or pain.

## 2019-10-10 NOTE — H&P (Signed)
HPI:  Heather [name] is a 48 y.o. female with end-stage renal disease.  [name] currently dialyzing via right IJ catheter is been placed on 3 separate occasions.  [name] on the transplant list at [facility].  [name] undergone multiple bilateral upper extremity access procedures.  [name] here today to consider new access.  She would like to avoid groin access.  Ultrasounds arterial and venous performed prior to today's visit.      Past Medical History:  Diagnosis Date  . Antiplatelet or antithrombotic long-term use: Plavix 06/30/2017  . B12 deficiency 05/10/2014  . Blood transfusion without reported diagnosis   . Chronic constipation   . 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 Cyst of left ovary 01/12/2018  . Diabetic neuropathy, painful ([place]), on low dose Gabapentin 07/13/2013  . DM (diabetes mellitus), type 2, uncontrolled, with renal complications ([place]) 40/98/1191  . DM2 (diabetes mellitus, type 2) ([place])   . Dysfunction of left eustachian tube, with pusatile tinnitus 11/24/2017  . Dyslipidemia associated with type 2 diabetes mellitus ([place]) 06/25/2016  . ESRD (end stage renal disease) on dialysis [facility])    On hemodialysis in July 2019 via [facility] then switched to [facility] in Nov 2019.   [name] ESRD with anemia ([place])   . Fibromyalgia   . Gastroparesis due to DM   . GERD (gastroesophageal reflux disease)   . Hypertension associated with diabetes ([place]) 02/19/2011  . IBS (irritable bowel syndrome)   . Left-sided weakness 12/10/2017   .  [name] Leiomyoma of uterus   . Pancreatitis   . Proliferative diabetic retinopathy ([place]) 09/29/2015  . 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 Reactive depression 12/10/2017   .  [name] Right-sided low back pain without sciatica 12/08/2015  . Secondary hyperthyroidism 11/27/2013  . 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 Thromboembolism ([place]) 04/05/2018  . Upper airway cough  syndrome, with recs to stay off ACE and take Pepcid q hs 11/15/2016        Family History  Problem Relation Age of Onset  . Colon polyps Mother   . Diabetes Mother   . Heart murmur Father        history essentially unknown  . Hypertension Father   . Diabetes Sister   . Kidney failure Sister        kidney transplant  . Diabetes Brother   . Retinal degeneration Brother   . Heart murmur Son   . Stroke Paternal Grandmother   . Diabetes Sister         Past Surgical History:  Procedure Laterality Date  . ARTERY REPAIR Right 02/17/2018   Procedure: EXPLORATION OF RIGHT BRACHIAL ARTERY;  Surgeon: Conrad Louisa, MD;  Location: [place];  Service: Vascular;  Laterality: Right;  . ARTERY REPAIR Right 02/17/2018   Procedure: BRACHIAL ARTERY EXPLORATION AND TRHOMBECTOMY;  Surgeon: Conrad Ewing, MD;  Location: [facility] OR;  Service: Vascular;  Laterality: Right;  . AV FISTULA PLACEMENT Left 05/09/2017   Procedure: INSERTION OF ARTERIOVENOUS (AV) GRAFT ARM (ARTEGRAFT);  Surgeon: Conrad Dumont, MD;  Location: [facility] OR;  Service: Vascular;  Laterality: Left;  . AV FISTULA PLACEMENT Right 02/17/2018   Procedure: INSERTION OF ARTERIOVENOUS (AV) GORE-TEX GRAFT ARM RIGHT UPPER ARM;  Surgeon: Conrad , MD;  Location: [place];  Service: Vascular;  Laterality: Right;  . BASCILIC VEIN TRANSPOSITION Left 08/31/2016   Procedure: BASILIC VEIN TRANSPOSITION FIRST STAGE;  Surgeon: Aaron Edelman  Starlyn Skeans, MD;  Location: [place];  Service: Vascular;  Laterality: Left;  . CENTRAL VENOUS CATHETER INSERTION Left 07/24/2018   Procedure: INSERTION CENTRAL LINE ADULT;  Surgeon: Conrad Aliquippa, MD;  Location: [place];  Service: Vascular;  Laterality: Left;  . EYE SURGERY     secondary to diabetic retinopathy   . FOOT SURGERY Right    t"ook bone out- maybe hammer toe"  . INSERTION OF DIALYSIS CATHETER Left 07/24/2018   Procedure: INSERTION OF TUNNELED DIALYSIS CATHETER;  Surgeon: Conrad [place], MD;  Location: [place];   Service: Vascular;  Laterality: Left;  . IR FLUORO GUIDE CV LINE RIGHT  07/21/2018  . IR FLUORO GUIDE CV LINE RIGHT  06/01/2019  . IR US GUIDE VASC ACCESS RIGHT  07/21/2018  . IR US GUIDE VASC ACCESS RIGHT  06/01/2019  . LIGATION OF ARTERIOVENOUS  FISTULA Left 05/09/2017   Procedure: LIGATION OF ARTERIOVENOUS  FISTULA;  Surgeon: Conrad M[place], MD;  Location: [place];  Service: Vascular;  Laterality: Left;  . LOOP RECORDER INSERTION N/A 12/05/2017   Procedure: LOOP RECORDER INSERTION;  Surgeon: Evans Lance, MD;  Location: [place] CV LAB;  Service: Cardiovascular;  Laterality: N/A;  . MYOMECTOMY    . REMOVAL OF GRAFT Right 02/17/2018   Procedure: REMOVAL OF RIGHT UPPER ARM ARTERIOVENOUS GRAFT;  Surgeon: Conrad [place], MD;  Location: [place];  Service: Vascular;  Laterality: Right;  . REVISION OF ARTERIOVENOUS GORETEX GRAFT Left 07/24/2018   Procedure: REDO ARTERIOVENOUS GORETEX GRAFT;  Surgeon: Conrad Zeph[place] Cove, MD;  Location: [place];  Service: Vascular;  Laterality: Left;  . TEE WITHOUT CARDIOVERSION N/A 12/05/2017   Procedure: TRANSESOPHAGEAL ECHOCARDIOGRAM (TEE);  Surgeon: Sanda Klein, MD;  Location: [facility] ENDOSCOPY;  Service: Cardiovascular;  Laterality: N/A;  . UPPER EXTREMITY VENOGRAPHY Left 07/13/2018   Procedure: UPPER EXTREMITY VENOGRAPHY - Central & Left Arm;  Surgeon: Conrad Bellefonte, MD;  Location: [place] CV LAB;  Service: Cardiovascular;  Laterality: Left;  . UTERINE FIBROID SURGERY    . VITRECTOMY Bilateral     Short Social History:  Social History        Tobacco Use  . Smoking status: Never Smoker  . Smokeless tobacco: Never Used  Substance Use Topics  . Alcohol use: No    Alcohol/week: 0.0 standard drinks         Allergies  Allergen Reactions  . Ibuprofen Other (See Comments)    CKD stage 3. Should avoid.  . Dilaudid [Hydromorphone Hcl] Itching and Other (See Comments)    Can take with Benadryl.  . Tramadol Itching          Current  Outpatient Medications  Medication Sig Dispense Refill  . albuterol (VENTOLIN HFA) 108 (90 Base) MCG/ACT inhaler Inhale into the lungs.    . blood glucose meter kit and supplies KIT Dispense based on patient and insurance preference. Use up to four times daily as directed. (FOR ICD-9 250.00, 250.01). 1 each 0  . calcium acetate (PHOSLO) 667 MG capsule     . Continuous Blood Gluc Sensor (FREESTYLE LIBRE 14 DAY SENSOR) MISC 1 patch by Does not apply route every 14 (fourteen) days. 6 each 1  . gabapentin (NEURONTIN) 100 MG capsule Take 3 capsules (300 mg total) by mouth as needed (pain).    . insulin aspart (NOVOLOG FLEXPEN) 100 UNIT/ML FlexPen Inject 10-20 Units into the skin 3 (three) times daily with meals. Sliding scale    . insulin degludec (TRESIBA FLEXTOUCH)  100 UNIT/ML SOPN FlexTouch Pen Inject 28 Units into the skin daily.     [name] linaclotide (LINZESS) 145 MCG CAPS capsule Take by mouth.    . lubiprostone (AMITIZA) 24 MCG capsule Take 1 capsule (24 mcg total) by mouth 2 (two) times daily with a meal. (Patient taking differently: Take 24 mcg by mouth as needed for constipation. )    . medroxyPROGESTERone (PROVERA) 5 MG tablet Take 1 tablet (5 mg total) by mouth daily. 5 tablet 0  . midodrine (PROAMATINE) 5 MG tablet TAKE 1 TABLET (5 MG TOTAL) BY MOUTH 2 (TWO) TIMES DAILY WITH A MEAL. 180 tablet 0   No current facility-administered medications for this visit.     Review of Systems  Constitutional:  Constitutional negative. HENT: HENT negative.  Eyes: Eyes negative.  Respiratory: Respiratory negative.  Cardiovascular: Cardiovascular negative.  GI: Gastrointestinal negative.  Musculoskeletal: Musculoskeletal negative.  Skin: Skin negative.  Neurological: Neurological negative. Hematologic: Hematologic/lymphatic negative.  Psychiatric: Psychiatric negative.        Objective:   Objective '[]' Expand by Default       Vitals:   08/31/19 1130  BP: (!) 160/89   Pulse: 86  Resp: 20  Temp: 97.8 F (36.6 C)  SpO2: 100%  Weight: 229 lb 12.8 oz (104.2 kg)  Height: '5\' 9"'  (1.753 m)   Body mass index is 33.94 kg/m.  Physical Exam HENT:     Head: Normocephalic.  Eyes:     Pupils: Pupils are equal, round, and reactive to light.  Neck:     Musculoskeletal: Normal range of motion.  Cardiovascular:     Rate and Rhythm: Normal rate.     Pulses:          Radial pulses are 2+ on the right side and 2+ on the left side.  Abdominal:     General: Abdomen is flat.  Musculoskeletal:     Comments: Multiple well-healed scars bilateral upper extremities  Skin:    General: Skin is warm and dry.     Capillary Refill: Capillary refill takes less than 2 seconds.  Neurological:     General: No focal deficit present.     Mental Status: [name] alert.  Psychiatric:        Mood and Affect: Mood normal.        Thought Content: Thought content normal.        Judgment: Judgment normal.     Data: I have independently turbid her vein mapping demonstrates thrombosed left brachiocephalic fistula.  Right arm has minimal cephalic or basilic vein.  Left arm basilic vein 1.61 the antecubital fossa distal upper arm 0.4 cm.  I have independently interpreted her arterial duplex to be 0.38 cm right and biphasic on the left 0.43 cm and biphasic.     Assessment/Plan:   Assessment   48 year old female with multiple bilateral upper extremity access procedures.  Now presents for new access placement.  Has a marginal left basilic vein although has had a graft very near this as well in the past.  She has undergone venography and now plan for new left upper arm av graft vs fistula.     Waynetta Sandy MD [facility]

## 2019-10-11 ENCOUNTER — Encounter (HOSPITAL_COMMUNITY): Payer: Self-pay | Admitting: Vascular Surgery

## 2019-10-11 NOTE — Progress Notes (Signed)
Patient didn't show for her appointment.

## 2019-10-13 ENCOUNTER — Other Ambulatory Visit: Payer: Self-pay

## 2019-10-13 ENCOUNTER — Encounter (HOSPITAL_COMMUNITY): Payer: Self-pay | Admitting: Emergency Medicine

## 2019-10-13 ENCOUNTER — Non-Acute Institutional Stay (HOSPITAL_COMMUNITY)
Admission: EM | Admit: 2019-10-13 | Discharge: 2019-10-13 | Disposition: A | Discharge status: Home / Self Care | Payer: Managed Care, Other (non HMO) | Attending: Emergency Medicine | Admitting: Emergency Medicine

## 2019-10-13 ENCOUNTER — Emergency Department (HOSPITAL_COMMUNITY): Payer: Managed Care, Other (non HMO)

## 2019-10-13 DIAGNOSIS — X58XXXA Exposure to other specified factors, initial encounter: Secondary | ICD-10-CM | POA: Insufficient documentation

## 2019-10-13 DIAGNOSIS — D631 Anemia in chronic kidney disease: Secondary | ICD-10-CM | POA: Insufficient documentation

## 2019-10-13 DIAGNOSIS — Z992 Dependence on renal dialysis: Secondary | ICD-10-CM | POA: Insufficient documentation

## 2019-10-13 DIAGNOSIS — Z794 Long term (current) use of insulin: Secondary | ICD-10-CM | POA: Insufficient documentation

## 2019-10-13 DIAGNOSIS — T8249XA Other complication of vascular dialysis catheter, initial encounter: Secondary | ICD-10-CM | POA: Diagnosis present

## 2019-10-13 DIAGNOSIS — E11319 Type 2 diabetes mellitus with unspecified diabetic retinopathy without macular edema: Secondary | ICD-10-CM | POA: Insufficient documentation

## 2019-10-13 DIAGNOSIS — Z20828 Contact with and (suspected) exposure to other viral communicable diseases: Secondary | ICD-10-CM | POA: Diagnosis not present

## 2019-10-13 DIAGNOSIS — N2581 Secondary hyperparathyroidism of renal origin: Secondary | ICD-10-CM | POA: Diagnosis not present

## 2019-10-13 DIAGNOSIS — E1140 Type 2 diabetes mellitus with diabetic neuropathy, unspecified: Secondary | ICD-10-CM | POA: Insufficient documentation

## 2019-10-13 DIAGNOSIS — K219 Gastro-esophageal reflux disease without esophagitis: Secondary | ICD-10-CM | POA: Diagnosis not present

## 2019-10-13 DIAGNOSIS — M797 Fibromyalgia: Secondary | ICD-10-CM | POA: Insufficient documentation

## 2019-10-13 DIAGNOSIS — N186 End stage renal disease: Secondary | ICD-10-CM | POA: Diagnosis present

## 2019-10-13 DIAGNOSIS — E1122 Type 2 diabetes mellitus with diabetic chronic kidney disease: Secondary | ICD-10-CM | POA: Diagnosis not present

## 2019-10-13 DIAGNOSIS — K3184 Gastroparesis: Secondary | ICD-10-CM | POA: Insufficient documentation

## 2019-10-13 DIAGNOSIS — I120 Hypertensive chronic kidney disease with stage 5 chronic kidney disease or end stage renal disease: Secondary | ICD-10-CM | POA: Diagnosis not present

## 2019-10-13 DIAGNOSIS — I69854 Hemiplegia and hemiparesis following other cerebrovascular disease affecting left non-dominant side: Secondary | ICD-10-CM | POA: Insufficient documentation

## 2019-10-13 DIAGNOSIS — K589 Irritable bowel syndrome without diarrhea: Secondary | ICD-10-CM | POA: Diagnosis not present

## 2019-10-13 DIAGNOSIS — Z79899 Other long term (current) drug therapy: Secondary | ICD-10-CM | POA: Insufficient documentation

## 2019-10-13 DIAGNOSIS — E785 Hyperlipidemia, unspecified: Secondary | ICD-10-CM | POA: Diagnosis not present

## 2019-10-13 LAB — MAGNESIUM: Magnesium: 1.9 mg/dL (ref 1.7–2.4)

## 2019-10-13 LAB — CBC WITH DIFFERENTIAL/PLATELET
Abs Immature Granulocytes: 0.04 10*3/uL (ref 0.00–0.07)
Basophils Absolute: 0 10*3/uL (ref 0.0–0.1)
Basophils Relative: 0 %
Eosinophils Absolute: 0.3 10*3/uL (ref 0.0–0.5)
Eosinophils Relative: 4 %
HCT: 37.7 % (ref 36.0–46.0)
Hemoglobin: 10.4 g/dL — ABNORMAL LOW (ref 12.0–15.0)
Immature Granulocytes: 1 %
Lymphocytes Relative: 20 %
Lymphs Abs: 1.3 10*3/uL (ref 0.7–4.0)
MCH: 27.5 pg (ref 26.0–34.0)
MCHC: 27.6 g/dL — ABNORMAL LOW (ref 30.0–36.0)
MCV: 99.7 fL (ref 80.0–100.0)
Monocytes Absolute: 0.6 10*3/uL (ref 0.1–1.0)
Monocytes Relative: 9 %
Neutro Abs: 4.5 10*3/uL (ref 1.7–7.7)
Neutrophils Relative %: 66 %
Platelets: 243 10*3/uL (ref 150–400)
RBC: 3.78 MIL/uL — ABNORMAL LOW (ref 3.87–5.11)
RDW: 15.6 % — ABNORMAL HIGH (ref 11.5–15.5)
WBC: 6.7 10*3/uL (ref 4.0–10.5)
nRBC: 0.3 % — ABNORMAL HIGH (ref 0.0–0.2)

## 2019-10-13 LAB — RENAL FUNCTION PANEL
Albumin: 3 g/dL — ABNORMAL LOW (ref 3.5–5.0)
Anion gap: 16 — ABNORMAL HIGH (ref 5–15)
BUN: 29 mg/dL — ABNORMAL HIGH (ref 6–20)
CO2: 21 mmol/L — ABNORMAL LOW (ref 22–32)
Calcium: 8.9 mg/dL (ref 8.9–10.3)
Chloride: 97 mmol/L — ABNORMAL LOW (ref 98–111)
Creatinine, Ser: 8.45 mg/dL — ABNORMAL HIGH (ref 0.44–1.00)
GFR calc Af Amer: 6 mL/min — ABNORMAL LOW (ref >60)
GFR calc non Af Amer: 5 mL/min — ABNORMAL LOW (ref >60)
Glucose, Bld: 278 mg/dL — ABNORMAL HIGH (ref 70–99)
Phosphorus: 7.9 mg/dL — ABNORMAL HIGH (ref 2.5–4.6)
Potassium: 5.1 mmol/L (ref 3.5–5.1)
Sodium: 134 mmol/L — ABNORMAL LOW (ref 135–145)

## 2019-10-13 LAB — URINE CULTURE: Culture: >=100000 — AB

## 2019-10-13 LAB — SARS CORONAVIRUS 2 (TAT 6-24 HRS): SARS Coronavirus 2: NEGATIVE

## 2019-10-13 MED ORDER — DOXERCALCIFEROL 4 MCG/2ML IV SOLN
6.0000 ug | INTRAVENOUS | Status: DC
  Filled 2019-10-13: qty 4

## 2019-10-13 MED ORDER — ALTEPLASE 2 MG IJ SOLR
INTRAMUSCULAR | Status: AC
  Administered 2019-10-13: 4 mg
  Filled 2019-10-13: qty 4

## 2019-10-13 MED ORDER — HEPARIN SODIUM (PORCINE) 1000 UNIT/ML IJ SOLN
INTRAMUSCULAR | Status: AC
  Administered 2019-10-13: 3000 [IU]
  Filled 2019-10-13: qty 3

## 2019-10-13 MED ORDER — HEPARIN SODIUM (PORCINE) 1000 UNIT/ML IJ SOLN
INTRAMUSCULAR | Status: AC
  Administered 2019-10-13: 1500 [IU]
  Filled 2019-10-13: qty 2

## 2019-10-13 MED ORDER — CHLORHEXIDINE GLUCONATE CLOTH 2 % EX PADS: 6.0000 | MEDICATED_PAD | Freq: Every day | CUTANEOUS | Status: DC

## 2019-10-13 MED ORDER — HEPARIN SODIUM (PORCINE) 1000 UNIT/ML IJ SOLN
INTRAMUSCULAR | Status: AC
  Filled 2019-10-13: qty 1

## 2019-10-13 MED ORDER — ALTEPLASE 2 MG IJ SOLR
4.0000 mg | Freq: Once | INTRAMUSCULAR | Status: AC
  Administered 2019-10-13: 12:00:00 4 mg

## 2019-10-13 NOTE — ED Triage Notes (Signed)
Pt went for dialysis this morning and they were unable to get blood return from dialysis port.

## 2019-10-13 NOTE — Progress Notes (Signed)
Treatment complete goal partially met tolerated well. Vitals stable a/o x 4 Patient departed unit via wheelchair to a waiting car.

## 2019-10-13 NOTE — ED Notes (Signed)
Off going nurse called report to dialysis. Pt reluctantly agreed to go do HD. Pt being transferred by NT to HD at this time.

## 2019-10-13 NOTE — ED Provider Notes (Signed)
[facility] EMERGENCY DEPARTMENT Provider Note   CSN: [id number] Arrival date & time: 10/13/19  3329     History   Chief Complaint Chief Complaint  Patient presents with  . Vascular Access Problem    HPI Heather [name] is a 48 y.o. female with a history of end-stage renal disease on dialysis Tuesday Thursday Saturday, presenting to the emergency department with a dialysis port vascular access problem.  She reports she went to dialysis as scheduled today but they were unable to draw blood or use her port for dialysis.  She states she is been having ongoing issues like this with vascular access for many months.  Her last dialysis session was on Thursday and she has not missed any prior sessions.  She states that during her previous dialysis session they were having difficulty using her port but were able to complete the session.  Today they were unable to draw any blood from the port were begin her session.  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  She reports that this current right-sided chest wall port was placed by the vascular access team approximately 1 month ago.  She has had multiple ports placed over the past several months for issues with clotting.  She very recently had a left arm fistula placed on 10/10/2019 by Dr. Servando Snare, but it has not yet matured for use.  She separately reports to me that she has had a persistent cough for 2 weeks.  She says she was tested negative for Covid approximately 1 week ago.  She continues to complain of coughing, shortness of breath and chills.    HPI  Past Medical History:  Diagnosis Date  . Antiplatelet or antithrombotic long-term use: Plavix 06/30/2017  . B12 deficiency 05/10/2014  . Blood transfusion without reported diagnosis   . Chronic constipation   . CVA (cerebral vascular accident) ([place]),  nonhemorrhagic, inferior right cerebellum 12/03/2017   left side weakness in leg  . Cyst of left ovary 01/12/2018  . Diabetic neuropathy, painful ([place]), on low dose Gabapentin 07/13/2013  . DM (diabetes mellitus), type 2, uncontrolled, with renal complications ([place]) 51/88/4166  . DM2 (diabetes mellitus, type 2) ([place])   . Dysfunction of left eustachian tube, with pusatile tinnitus 11/24/2017  . Dyslipidemia associated with type 2 diabetes mellitus ([place]) 06/25/2016  . ESRD (end stage renal disease) on dialysis [facility])    On hemodialysis in July 2019 via [facility] then switched to [facility] in Nov 2019.   [name] ESRD with anemia ([place])   . Fibromyalgia   . Gastroparesis due to DM   . GERD (gastroesophageal reflux disease)   . Headache   . Hypertension associated with diabetes ([place]) 02/19/2011  . IBS (irritable bowel syndrome)   . Left-sided weakness 12/10/2017   .  [name] Leiomyoma of uterus   . Pancreatitis   . Pneumonia   . Proliferative diabetic retinopathy ([place]) 09/29/2015  . 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 Reactive depression 12/10/2017   .  [name] Right-sided low back pain without sciatica 12/08/2015  . Secondary hyperthyroidism 11/27/2013  . Steal syndrome dialysis vascular access ([place]) 02/17/2018  . Thromboembolism ([place]) 04/05/2018  . Upper airway cough syndrome, with recs to stay off ACE and take Pepcid q hs 11/15/2016    Patient Active Problem List   Diagnosis Date Noted  . ESRD (  end stage renal disease) ([place]) 10/13/2019  . Pericardial effusion 06/14/2019  . PD catheter dysfunction ([place]) 05/31/2019  . ESRD on peritoneal dialysis ([place]) 04/25/2019  . History of peritonitis 03/04/2019  . Leucocytosis   . Neuropathic pain   . Renovascular hypertension   . Anemia of chronic disease   . Diabetes mellitus type 2 in obese ([place])   . ESRD on dialysis ([place])   . Bacterial peritonitis ([place])   . Debility 02/13/2019  . Peritonitis, dialysis-associated, initial encounter ([place]) 02/05/2019  .  Peritonitis ([place]) 02/05/2019  . Left ovarian cyst 02/05/2019  . Hemiplegia and hemiparesis following cerebral infarction affecting left non-dominant side ([place]) 12/29/2018  . Peritoneal dialysis catheter in place [facility]) 11/14/2018  . Pre-transplant evaluation for kidney transplant 11/07/2018  . ESRD with anemia ([place])   . ESRD (end stage renal disease) on dialysis ([place])   . Grade I diastolic dysfunction 97/01/6377  . Arteriovenous fistula ([place]) 07/21/2018  . Type 2 diabetes mellitus with end-stage renal disease ([place]) 07/21/2018  . History of cerebrovascular accident (CVA) involving cerebellum 07/21/2018  . Cyst of left ovary 01/12/2018  . Dysmenorrhea 01/12/2018  . Insomnia 01/12/2018  . Menorrhagia 01/12/2018  . Vitamin D deficiency 01/02/2018  . Bilateral lower extremity edema 01/02/2018  . Reactive depression 12/10/2017  . Arthralgia of right temporomandibular joint 10/24/2017  . Diabetic ulcer of right midfoot associated with type 2 diabetes mellitus, with fat layer exposed ([place]) 08/06/2017  . IBS (irritable bowel syndrome) 08/06/2017  . Antiplatelet or antithrombotic long-term use: Plavix 06/30/2017  . Morbid obesity due to excess calories ([place]) 11/16/2016  . Upper airway cough syndrome 11/15/2016  . DM (diabetes mellitus), type 2, uncontrolled, with renal complications ([place]) 58/85/0277  . Dyslipidemia associated with type 2 diabetes mellitus ([place]) 06/25/2016  . Uncontrolled type 2 diabetes mellitus with both eyes affected by proliferative retinopathy and macular edema, with long-term current use of insulin ([place]) 06/22/2016  . Uncontrolled type 2 diabetes mellitus with diabetic polyneuropathy, with long-term current use of insulin ([place]) 03/08/2016  . Proliferative diabetic retinopathy ([place]) 09/29/2015  . Constipation 09/03/2015  . Pseudophakia of both eyes 05/30/2015  . 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 B12 deficiency 05/10/2014  . Anemia associated with chronic renal  failure, Proc[place] 04/26/2014  . CKD (chronic kidney disease) stage 5, GFR less than 15 ml/min (HCC) 04/26/2014  . Secondary hyperparathyroidism ([place]) 11/27/2013  . Posterior subcapsular cataract, bilateral 10/18/2013  . Diabetic neuropathy, painful ([place]), on low dose Gabapentin 07/13/2013  . Diabetes mellitus, type II, insulin dependent ([place]), on CGM, with end organ damage: renal, neuropathy, gastroparesis, retinopathy 02/19/2011  . Hypertension associated with diabetes ([place]) 02/19/2011  . Gastroparesis due to DM ([place]) 02/19/2011    Past Surgical History:  Procedure Laterality Date  . ARTERY REPAIR Right 02/17/2018   Procedure: EXPLORATION OF RIGHT BRACHIAL ARTERY;  Surgeon: Conrad Sagadahoc, MD;  Location: [facility];  Service: Vascular;  Laterality: Right;  . ARTERY REPAIR Right 02/17/2018   Procedure: BRACHIAL ARTERY EXPLORATION AND TRHOMBECTOMY;  Surgeon: Conrad Alpine, MD;  [facility];  Service: Vascular;  Laterality: Right;  . AV FISTULA PLACEMENT Left 05/09/2017   Procedure: INSERTION OF ARTERIOVENOUS (AV) GRAFT ARM (ARTEGRAFT);  Surgeon: Conrad [place] View, MD;  Location: [facility] OR;  Service: Vascular;  Laterality: Left;  . AV FISTULA PLACEMENT Right 02/17/2018   Procedure: INSERTION OF ARTERIOVENOUS (AV) GORE-TEX GRAFT ARM RIGHT UPPER ARM;  Surgeon: Conrad Waynesburg, MD;  Location: [facility];  Service:  Vascular;  Laterality: Right;  . AV FISTULA PLACEMENT Left 10/10/2019   Procedure: INSERTION OF LEFT ARTERIOVENOUS (AV) ARTEGRAFT GRAFT ARM;  Surgeon: Waynetta Sandy, MD;  Location: [facility];  Service: Vascular;  Laterality: Left;  . BASCILIC VEIN TRANSPOSITION Left 08/31/2016   Procedure: BASILIC VEIN TRANSPOSITION FIRST STAGE;  Surgeon: Conrad [place], MD;  Location: [facility];  Service: Vascular;  Laterality: Left;  . CENTRAL VENOUS CATHETER INSERTION Left 07/24/2018   Procedure: INSERTION CENTRAL LINE ADULT;  Surgeon: Conrad Odessa, MD;  Location: [facility];  Service: Vascular;  Laterality: Left;  . EYE  SURGERY     secondary to diabetic retinopathy   . FOOT SURGERY Right    t"ook bone out- maybe hammer toe"  . INSERTION OF DIALYSIS CATHETER Left 07/24/2018   Procedure: INSERTION OF TUNNELED DIALYSIS CATHETER;  Surgeon: Conrad Arrington, MD;  Location: [facility];  Service: Vascular;  Laterality: Left;  . IR FLUORO GUIDE CV LINE RIGHT  07/21/2018  . IR FLUORO GUIDE CV LINE RIGHT  06/01/2019  . IR US GUIDE VASC ACCESS RIGHT  07/21/2018  . IR US GUIDE VASC ACCESS RIGHT  06/01/2019  . LIGATION OF ARTERIOVENOUS  FISTULA Left 05/09/2017   Procedure: LIGATION OF ARTERIOVENOUS  FISTULA;  Surgeon: Conrad Linden, MD;  Location: [facility];  Service: Vascular;  Laterality: Left;  . LOOP RECORDER INSERTION N/A 12/05/2017   Procedure: LOOP RECORDER INSERTION;  Surgeon: Evans Lance, MD;  Location: [facility] CV LAB;  Service: Cardiovascular;  Laterality: N/A;  . MYOMECTOMY    . REMOVAL OF GRAFT Right 02/17/2018   Procedure: REMOVAL OF RIGHT UPPER ARM ARTERIOVENOUS GRAFT;  Surgeon: Conrad [place]terville, MD;  Location: [facility];  Service: Vascular;  Laterality: Right;  . REVISION OF ARTERIOVENOUS GORETEX GRAFT Left 07/24/2018   Procedure: REDO ARTERIOVENOUS GORETEX GRAFT;  Surgeon: Conrad Coates, MD;  Location: [facility];  Service: Vascular;  Laterality: Left;  . TEE WITHOUT CARDIOVERSION N/A 12/05/2017   Procedure: TRANSESOPHAGEAL ECHOCARDIOGRAM (TEE);  Surgeon: Sanda Klein, MD;  Location: [facility] ENDOSCOPY;  Service: Cardiovascular;  Laterality: N/A;  . UPPER EXTREMITY VENOGRAPHY Left 07/13/2018   Procedure: UPPER EXTREMITY VENOGRAPHY - Central & Left Arm;  Surgeon: Conrad , MD;  Location: [facility] CV LAB;  Service: Cardiovascular;  Laterality: Left;  . UPPER EXTREMITY VENOGRAPHY Bilateral 09/10/2019   Procedure: UPPER EXTREMITY VENOGRAPHY;  Surgeon: Waynetta Sandy, MD;  Location: [facility] CV LAB;  Service: Cardiovascular;  Laterality: Bilateral;  . UTERINE FIBROID SURGERY    . VITRECTOMY Bilateral      OB  History    Gravida  2   Para  1   Term      Preterm      AB  1   Living  1     SAB  1   TAB      Ectopic      Multiple      Live Births               Home Medications    Prior to Admission medications   Medication Sig Start Date End Date Taking? Authorizing Provider  aspirin-acetaminophen-caffeine (EXCEDRIN MIGRAINE) [phone] MG tablet Take 1 tablet by mouth daily as needed for headache.   Yes [provider]  calcium acetate (PHOSLO) 667 MG capsule Take 667 mg by mouth 3 (three) times daily with meals.  08/30/19  Yes [provider]  carvedilol (COREG) 12.5 MG tablet Take 6.25  mg by mouth daily.  09/13/19  Yes [provider]  gabapentin (NEURONTIN) 300 MG capsule Take 300 mg by mouth 2 (two) times daily as needed (for pain).    Yes [provider]  insulin aspart (NOVOLOG FLEXPEN) 100 UNIT/ML FlexPen Inject 6 Units into the skin daily. Sliding scale   Yes [provider]  insulin degludec (TRESIBA FLEXTOUCH) 100 UNIT/ML SOPN FlexTouch Pen Inject 28 Units into the skin at bedtime.  03/20/19  Yes [provider]  lubiprostone (AMITIZA) 24 MCG capsule Take 1 capsule (24 mcg total) by mouth 2 (two) times daily with a meal. Patient taking differently: Take 24 mcg by mouth as needed for constipation.  02/23/19  Yes Love, Ivan Anchors, PA-C  midodrine (PROAMATINE) 5 MG tablet TAKE 1 TABLET (5 MG TOTAL) BY MOUTH 2 (TWO) TIMES DAILY WITH A MEAL. Patient taking differently: Take 5 mg by mouth daily as needed (hypotension).  07/04/19  Yes Briscoe Deutscher, DO  oxyCODONE-acetaminophen (PERCOCET/ROXICET) 5-325 MG tablet Take 1 tablet by mouth every 6 (six) hours as needed. Patient taking differently: Take 1 tablet by mouth every 6 (six) hours as needed for moderate pain.  10/10/19  Yes Ulyses Amor, PA-C  Continuous Blood Gluc Sensor (FREESTYLE LIBRE 14 DAY SENSOR) MISC 1 patch by Does not apply route every 14 (fourteen) days. 04/11/19    Briscoe Deutscher, DO  gabapentin (NEURONTIN) 100 MG capsule Take 3 capsules (300 mg total) by mouth as needed (pain). Patient not taking: Reported on 10/13/2019 06/02/19   Geradine Girt, DO    Family History Family History  Problem Relation Age of Onset  . Colon polyps Mother   . Diabetes Mother   . Heart murmur Father        history essentially unknown  . Hypertension Father   . Diabetes Sister   . Kidney failure Sister        kidney transplant  . Diabetes Brother   . Retinal degeneration Brother   . Heart murmur Son   . Stroke Paternal Grandmother   . Diabetes Sister     Social History Social History   Tobacco Use  . Smoking status: Never Smoker  . Smokeless tobacco: Never Used  Substance Use Topics  . Alcohol use: No    Alcohol/week: 0.0 standard drinks  . Drug use: No     Allergies   Ibuprofen, Dilaudid [hydromorphone hcl], and Tramadol   Review of Systems Review of Systems  Constitutional: Negative for chills and fever.  Eyes: Negative for pain and visual disturbance.  Respiratory: Positive for cough. Negative for shortness of breath.   Cardiovascular: Negative for chest pain and palpitations.  Gastrointestinal: Negative for abdominal pain, nausea and vomiting.  Musculoskeletal: Negative for arthralgias and back pain.  Neurological: Negative for syncope and light-headedness.  All other systems reviewed and are negative.    Physical Exam Updated Vital Signs BP 122/66   Pulse 84   Temp 98.3 F (36.8 C) (Oral)   Resp 16   SpO2 99%   Physical Exam Vitals signs and nursing note reviewed.  Constitutional:      General: [name] not in acute distress.    Appearance: [name] well-developed.  HENT:     Head: Normocephalic and atraumatic.  Eyes:     Conjunctiva/sclera: Conjunctivae normal.  Neck:     Musculoskeletal: Neck supple.  Cardiovascular:     Rate and Rhythm: Normal rate and regular rhythm.     Pulses: Normal pulses.  Comments: Right  anterior chest wall port insertion site clean Pulmonary:     Effort: Pulmonary effort is normal. No respiratory distress.     Breath sounds: Normal breath sounds.  Abdominal:     Palpations: Abdomen is soft.     Tenderness: There is no abdominal tenderness.  Musculoskeletal:     Comments: Left arm graft site appears clean, patent thrill  Skin:    General: Skin is warm and dry.  Neurological:     Mental Status: [name] alert.      ED Treatments / Results  Labs (all labs ordered are listed, but only abnormal results are displayed) Labs Reviewed  RENAL FUNCTION PANEL - Abnormal; Notable for the following components:      Result Value   Sodium 134 (*)    Chloride 97 (*)    CO2 21 (*)    Glucose, Bld 278 (*)    BUN 29 (*)    Creatinine, Ser 8.45 (*)    Phosphorus 7.9 (*)    Albumin 3.0 (*)    GFR calc non Af Amer 5 (*)    GFR calc Af Amer 6 (*)    Anion gap 16 (*)    All other components within normal limits  CBC WITH DIFFERENTIAL/PLATELET - Abnormal; Notable for the following components:   RBC 3.78 (*)    Hemoglobin 10.4 (*)    MCHC 27.6 (*)    RDW 15.6 (*)    nRBC 0.3 (*)    All other components within normal limits  SARS CORONAVIRUS 2 (TAT 6-24 HRS)  MAGNESIUM    EKG EKG Interpretation  Date/Time:  Saturday October 13 2019 08:42:09 EDT Ventricular Rate:  87 PR Interval:    QRS Duration: 93 QT Interval:  369 QTC Calculation: 444 R Axis:   53 Text Interpretation:  Sinus rhythm No STEMI  Confirmed by Octaviano Glow [id number]) on 10/13/2019 9:08:08 AM   Radiology Dg Chest Portable 1 View  Result Date: 10/13/2019 CLINICAL DATA:  Congestion and cough. Reportedly, dialysis catheter not functioning. EXAM: PORTABLE CHEST 1 VIEW COMPARISON:  05/23/2019 FINDINGS: Patient has a right jugular dialysis catheter. Catheter tip is at the junction of the SVC and right atrium. Lungs are clear without pulmonary edema. Negative for pneumothorax. Evidence for old fractures with  callus formation involving the left sixth and seventh ribs. Heart size is within normal limits. Trachea is midline. IMPRESSION: 1. No acute cardiopulmonary disease. 2. Dialysis catheter appears to be appropriately positioned with the tip near the SVC and right atrium junction. 3. Old left rib fractures. Electronically Signed   By: Markus Daft M.D.   On: 10/13/2019 08:37    Procedures Procedures (including critical care time)  Medications Ordered in ED Medications  Chlorhexidine Gluconate Cloth 2 % PADS 6 each (has no administration in time range)  doxercalciferol (HECTOROL) injection 6 mcg (has no administration in time range)  heparin 1000 UNIT/ML injection (has no administration in time range)  heparin 1000 UNIT/ML injection (has no administration in time range)  alteplase (CATHFLO ACTIVASE) injection 4 mg (4 mg Intracatheter Given 10/13/19 1223)     Initial Impression / Assessment and Plan / ED Course  I have reviewed the triage vital signs and the nursing notes.  Pertinent labs & imaging results that were available during my care of the patient were reviewed by me and considered in my medical decision making (see chart for details).  48 yo female w/ ESRD presenting from dialysis with malfunctioning or  clotted chest wall dialysis port.  Unable to initiate dialysis today.  No other missed sessions this week.  Patient afebrile, clinically well appearing on exam.  No sign of infection or volume overload.  She does have a chronic cough for 2 weeks.  Tested covid negative as outpatient.  We will likely need to repeat her test here, particularly if she needs dialysis or a procedure done.  I will reach out to vascular surgery  Dialysis labs ordered  Clinical Course as of Oct 12 1810  Sat Oct 13, 2019  1050 K+ okay.  Will discuss with vasc surgery next steps   [MT]  1123 I spoke to Dr. Trula Slade (vascular surgery) who subsequently spoke to the nephrologist on call.  Nephrology team will  send someone from their team to bedside to trouble-shoot catheter.  If unsuccessful they will discuss admission vs. Close outpatient f/u   [MT]  [place], nephrology NP, states they will take patient to dialysis suite and attempt to access site for outpatient dialysis.     [MT]  3903 Covid test resulted negative - anticipate they will take patient up to dialysis now   [MT]    Clinical Course User Index [MT] Jaquail Mclees, Carola Rhine, MD     Final Clinical Impressions(s) / ED Diagnoses   Final diagnoses:  ESRD (end stage renal disease) ([place])  Clotted dialysis access, initial encounter [facility])    ED Discharge Orders    None       Sargun Rummell, Carola Rhine, MD 10/13/19 [phone]

## 2019-10-13 NOTE — Procedures (Signed)
Pt seen and examined. Asked to see for dialysis due to malfunction of TDC at OP HD unit today.  TPA was instilled earlier and pt is up and running on HD now.  Pt is w/o any c/o's, no SOB/ CP/ abd pain.  Had new AVF placed a few days ago.  On exam chest / cor are wnl, abd soft and no LE edema. LUA AVF is strong and wounds are clean. Plan is for dc home after dialysis if remains stable.   I was present at this dialysis session, have reviewed the session itself and made  appropriate changes Kelly Splinter MD [place] pager [phone]   10/13/2019, 5:28 PM

## 2019-10-13 NOTE — ED Notes (Signed)
IV access: difficult to stick IV team consulted

## 2019-10-14 ENCOUNTER — Other Ambulatory Visit: Payer: Self-pay | Admitting: Family Medicine

## 2019-10-15 ENCOUNTER — Encounter: Payer: Self-pay | Admitting: Physician Assistant

## 2019-10-15 ENCOUNTER — Ambulatory Visit (INDEPENDENT_AMBULATORY_CARE_PROVIDER_SITE_OTHER): Payer: Managed Care, Other (non HMO) | Admitting: Physician Assistant

## 2019-10-15 ENCOUNTER — Other Ambulatory Visit: Payer: Self-pay

## 2019-10-15 VITALS — Ht 68.0 in | Wt 229.0 lb

## 2019-10-15 DIAGNOSIS — E1122 Type 2 diabetes mellitus with diabetic chronic kidney disease: Secondary | ICD-10-CM

## 2019-10-15 DIAGNOSIS — N186 End stage renal disease: Secondary | ICD-10-CM | POA: Diagnosis not present

## 2019-10-15 DIAGNOSIS — R399 Unspecified symptoms and signs involving the genitourinary system: Secondary | ICD-10-CM

## 2019-10-15 MED ORDER — CIPROFLOXACIN HCL 250 MG PO TABS: 250.0000 mg | ORAL_TABLET | Freq: Every day | ORAL | 0 refills | Status: AC

## 2019-10-15 NOTE — Progress Notes (Signed)
Virtual Visit via Video   I connected with Heather [name] on 10/15/19 at 11:20 AM EDT by a video enabled telemedicine application and verified that I am speaking with the correct person using two identifiers. Location patient: Home Location provider: [facility], Office Persons participating in the virtual visit: Natisha, Trzcinski PA-C, Anselmo Pickler, LPN   I discussed the limitations of evaluation and management by telemedicine and the availability of in person appointments. The patient expressed understanding and agreed to proceed.  I acted as a Education administrator for [employer], PA-C [employer], LPN  Subjective:   HPI:   UTI She was seen by me virtually for malaise, cloudy urine, pelvic pain, and URI symptoms. She was instructed to go to the ER or urgent care but she did not go. She was seen in the hospital for a scheduled AV graft placement by Dr. Servando Snare on 10/10/19.  Labs and urine were completed. COVID negative, WBC normal. Urine culture significant for citrobacter koseri. This result was forwarded to PCP by ordering provider and she has not been treated for this yet.  She currently c/o fatigue, lower abd pain off and on. Denies back pain or nausea. Overall, her symptoms have not worsened since she spoke with me virtually.   DM Patient is also requesting a referral to an endocrinologist to take better control of her DM.  ROS: See pertinent positives and negatives per HPI.  Patient Active Problem List   Diagnosis Date Noted  . ESRD (end stage renal disease) ([place]) 10/13/2019  . Pericardial effusion 06/14/2019  . PD catheter dysfunction ([place]) 05/31/2019  . ESRD on peritoneal dialysis ([place]) 04/25/2019  . History of peritonitis 03/04/2019  . Leucocytosis   . Neuropathic pain   . Renovascular hypertension   . Anemia of chronic disease   . Diabetes mellitus type 2 in obese ([place])   . ESRD on dialysis ([place])   . Bacterial peritonitis ([place])   .  Debility 02/13/2019  . Peritonitis, dialysis-associated, initial encounter ([place]) 02/05/2019  . Peritonitis ([place]) 02/05/2019  . Left ovarian cyst 02/05/2019  . Hemiplegia and hemiparesis following cerebral infarction affecting left non-dominant side ([place]) 12/29/2018  . Peritoneal dialysis catheter in place [facility]) 11/14/2018  . Pre-transplant evaluation for kidney transplant 11/07/2018  . ESRD with anemia ([place])   . ESRD (end stage renal disease) on dialysis ([place])   . Grade I diastolic dysfunction 09/98/3382  . Arteriovenous fistula ([place]) 07/21/2018  . Type 2 diabetes mellitus with end-stage renal disease ([place]) 07/21/2018  . History of cerebrovascular accident (CVA) involving cerebellum 07/21/2018  . Cyst of left ovary 01/12/2018  . Dysmenorrhea 01/12/2018  . Insomnia 01/12/2018  . Menorrhagia 01/12/2018  . Vitamin D deficiency 01/02/2018  . Bilateral lower extremity edema 01/02/2018  . Reactive depression 12/10/2017  . Arthralgia of right temporomandibular joint 10/24/2017  . Diabetic ulcer of right midfoot associated with type 2 diabetes mellitus, with fat layer exposed ([place]) 08/06/2017  . IBS (irritable bowel syndrome) 08/06/2017  . Antiplatelet or antithrombotic long-term use: Plavix 06/30/2017  . Morbid obesity due to excess calories ([place]) 11/16/2016  . Upper airway cough syndrome 11/15/2016  . DM (diabetes mellitus), type 2, uncontrolled, with renal complications ([place]) 50/53/9767  . Dyslipidemia associated with type 2 diabetes mellitus ([place]) 06/25/2016  . Uncontrolled type 2 diabetes mellitus with both eyes affected by proliferative retinopathy and macular edema, with long-term current use of insulin ([place]) 06/22/2016  . Uncontrolled type 2 diabetes mellitus with diabetic  polyneuropathy, with long-term current use of insulin ([place]) 03/08/2016  . Proliferative diabetic retinopathy ([place]) 09/29/2015  . Constipation 09/03/2015  . Pseudophakia of both eyes 05/30/2015  . 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 B12 deficiency 05/10/2014  . Anemia associated with chronic renal failure, [place] 04/26/2014  . CKD (chronic kidney disease) stage 5, GFR less than 15 ml/min ([facility]) 04/26/2014  . Secondary hyperparathyroidism ([place]) 11/27/2013  . Posterior subcapsular cataract, bilateral 10/18/2013  . Diabetic neuropathy, painful ([place]), on low dose Gabapentin 07/13/2013  . Diabetes mellitus, type II, insulin dependent ([place]), on CGM, with end organ damage: renal, neuropathy, gastroparesis, retinopathy 02/19/2011  . Hypertension associated with diabetes ([place]) 02/19/2011  . Gastroparesis due to DM ([place]) 02/19/2011    Social History   Tobacco Use  . Smoking status: Never Smoker  . Smokeless tobacco: Never Used  Substance Use Topics  . Alcohol use: No    Alcohol/week: 0.0 standard drinks    Current Outpatient Medications:  .  aspirin-acetaminophen-caffeine (EXCEDRIN MIGRAINE) 250-250-65 MG tablet, Take 1 tablet by mouth daily as needed for headache., Disp: , Rfl:  .  calcium acetate (PHOSLO) 667 MG capsule, Take 667 mg by mouth 3 (three) times daily with meals. , Disp: , Rfl:  .  carvedilol (COREG) 12.5 MG tablet, Take 6.25 mg by mouth daily. , Disp: , Rfl:  .  Continuous Blood Gluc Sensor (FREESTYLE LIBRE 14 DAY SENSOR) MISC, 1 patch by Does not apply route every 14 (fourteen) days., Disp: 6 each, Rfl: 1 .  gabapentin (NEURONTIN) 100 MG capsule, TAKE 1 CAPSULE BY MOUTH EVERY DAY, Disp: 90 capsule, Rfl: 1 .  gabapentin (NEURONTIN) 300 MG capsule, Take 300 mg by mouth 2 (two) times daily as needed (for pain). , Disp: , Rfl:  .  insulin aspart (NOVOLOG FLEXPEN) 100 UNIT/ML FlexPen, Inject 6 Units into the skin daily. Sliding scale, Disp: , Rfl:  .  insulin degludec (TRESIBA FLEXTOUCH) 100 UNIT/ML SOPN FlexTouch Pen, Inject 28 Units into the skin at bedtime. , Disp: , Rfl:  .  lubiprostone (AMITIZA) 24 MCG capsule, Take 1 capsule (24 mcg total) by mouth 2 (two) times daily  with a meal. (Patient taking differently: Take 24 mcg by mouth as needed for constipation. ), Disp: , Rfl:  .  midodrine (PROAMATINE) 5 MG tablet, TAKE 1 TABLET (5 MG TOTAL) BY MOUTH 2 (TWO) TIMES DAILY WITH A MEAL. (Patient taking differently: Take 5 mg by mouth daily as needed (hypotension). ), Disp: 180 tablet, Rfl: 0 .  oxyCODONE-acetaminophen (PERCOCET/ROXICET) 5-325 MG tablet, Take 1 tablet by mouth every 6 (six) hours as needed. (Patient taking differently: Take 1 tablet by mouth every 6 (six) hours as needed for moderate pain. ), Disp: 12 tablet, Rfl: 0 .  ciprofloxacin (CIPRO) 250 MG tablet, Take 1 tablet (250 mg total) by mouth daily for 5 days., Disp: 5 tablet, Rfl: 0  Allergies  Allergen Reactions  . Ibuprofen Other (See Comments)    CKD stage 3. Should avoid.  . Dilaudid [Hydromorphone Hcl] Itching and Other (See Comments)    Can take with Benadryl.  . Tramadol Itching    Objective:   VITALS: Per patient if applicable, see vitals. GENERAL: Alert, appears well and in no acute distress. HEENT: Atraumatic, conjunctiva clear, no obvious abnormalities on inspection of external nose and ears. NECK: Normal movements of the head and neck. CARDIOPULMONARY: No increased WOB. Speaking in clear sentences. I:E ratio WNL.  MS: Moves all visible extremities without noticeable  abnormality. PSYCH: Pleasant and cooperative, well-groomed. Speech normal rate and rhythm. Affect is appropriate. Insight and judgement are appropriate. Attention is focused, linear, and appropriate.  NEURO: CN grossly intact. Oriented as arrived to appointment on time with no prompting. Moves both UE equally.  SKIN: No obvious lesions, wounds, erythema, or cyanosis noted on face or hands.  Assessment and Plan:   Raenah was seen today for urinary tract infection.  Diagnoses and all orders for this visit:  Lower urinary tract symptoms Culture sensitive to ciprofloxacin. Will start 250 mg daily x 5 days per UTD  guidelines. Very low threshold to send to ER if any symptoms worsen or change. Patient verbalized understanding to plan.  Type 2 diabetes mellitus with end-stage renal disease ([place]) Referral to [facility].   Other orders -     ciprofloxacin (CIPRO) 250 MG tablet; Take 1 tablet (250 mg total) by mouth daily for 5 days.  . Reviewed expectations re: course of current medical issues. . Discussed self-management of symptoms. . Outlined signs and symptoms indicating need for more acute intervention. . Patient verbalized understanding and all questions were answered. [name] Health Maintenance issues including appropriate healthy diet, exercise, and smoking avoidance were discussed with patient. . See orders for this visit as documented in the electronic medical record.  I discussed the assessment and treatment plan with the patient. The patient was provided an opportunity to ask questions and all were answered. The patient agreed with the plan and demonstrated an understanding of the instructions.   The patient was advised to call back or seek an in-person evaluation if the symptoms worsen or if the condition fails to improve as anticipated.   [place], [place] 10/15/2019

## 2019-10-16 ENCOUNTER — Other Ambulatory Visit: Payer: Managed Care, Other (non HMO)

## 2019-10-16 ENCOUNTER — Ambulatory Visit (INDEPENDENT_AMBULATORY_CARE_PROVIDER_SITE_OTHER): Payer: Managed Care, Other (non HMO) | Admitting: Obstetrics and Gynecology

## 2019-10-30 ENCOUNTER — Telehealth: Payer: Self-pay | Admitting: *Deleted

## 2019-10-30 NOTE — Telephone Encounter (Signed)
Call from [place] PA with [facility]. Patient has non-functioning A/V graft left arm. S/p graft insertion 10/10/2019 left arm. Patient has [facility] for dialysis. Appt time given for 11/02/2019 to arrive at 10 am. She will inform patient of appt. time.

## 2019-11-02 ENCOUNTER — Ambulatory Visit (INDEPENDENT_AMBULATORY_CARE_PROVIDER_SITE_OTHER): Payer: Self-pay | Admitting: Physician Assistant

## 2019-11-02 ENCOUNTER — Other Ambulatory Visit: Payer: Self-pay

## 2019-11-02 VITALS — BP 143/83 | HR 91 | Temp 98.0°F | Resp 14 | Ht 68.0 in | Wt 222.4 lb

## 2019-11-02 DIAGNOSIS — N186 End stage renal disease: Secondary | ICD-10-CM

## 2019-11-02 LAB — HEMOGLOBIN A1C: Hemoglobin A1C: 7.3

## 2019-11-02 NOTE — Progress Notes (Signed)
  POST OPERATIVE OFFICE NOTE    CC:  F/u for surgery  HPI:  This is a 48 y.o. female who has had multiple left upper extremity AV access procedures that have now failed and right UE graft placement she developed steal with right hand ischemia.    She has undergone venography which demonstrates central patency on the left.    s/p Redo left upper arm AV graft brachial artery to axillary vein with 7 mm Artegraft and and vein configuration.  She reports that the graft has thrombosed sometime in the last week or 2. He right [facility] is working with occasional problems.  This was placed by Dr. Augustin Coupe.  She has HD TTS.    She had a marginal basilic vein per Dr. Jamse Mead pre-op history and physical.  She wanted to avoid thigh graft placement, but she is now running out of options.    Allergies  Allergen Reactions  . Ibuprofen Other (See Comments)    CKD stage 3. Should avoid.  . Dilaudid [Hydromorphone Hcl] Itching and Other (See Comments)    Can take with Benadryl.  . Tramadol Itching    Current Outpatient Medications  Medication Sig Dispense Refill  . aspirin-acetaminophen-caffeine (EXCEDRIN MIGRAINE) 250-250-65 MG tablet Take 1 tablet by mouth daily as needed for headache.    . calcium acetate (PHOSLO) 667 MG capsule Take 667 mg by mouth 3 (three) times daily with meals.     . carvedilol (COREG) 12.5 MG tablet Take 6.25 mg by mouth daily.     . Continuous Blood Gluc Sensor (FREESTYLE LIBRE 14 DAY SENSOR) MISC 1 patch by Does not apply route every 14 (fourteen) days. 6 each 1  . gabapentin (NEURONTIN) 100 MG capsule TAKE 1 CAPSULE BY MOUTH EVERY DAY 90 capsule 1  . gabapentin (NEURONTIN) 300 MG capsule Take 300 mg by mouth 2 (two) times daily as needed (for pain).     . insulin aspart (NOVOLOG FLEXPEN) 100 UNIT/ML FlexPen Inject 6 Units into the skin daily. Sliding scale    . insulin degludec (TRESIBA FLEXTOUCH) 100 UNIT/ML SOPN FlexTouch Pen Inject 28 Units into the skin at bedtime.     [name] lubiprostone  (AMITIZA) 24 MCG capsule Take 1 capsule (24 mcg total) by mouth 2 (two) times daily with a meal. (Patient taking differently: Take 24 mcg by mouth as needed for constipation. )    . midodrine (PROAMATINE) 5 MG tablet TAKE 1 TABLET (5 MG TOTAL) BY MOUTH 2 (TWO) TIMES DAILY WITH A MEAL. (Patient taking differently: Take 5 mg by mouth daily as needed (hypotension). ) 180 tablet 0  . oxyCODONE-acetaminophen (PERCOCET/ROXICET) 5-325 MG tablet Take 1 tablet by mouth every 6 (six) hours as needed. (Patient taking differently: Take 1 tablet by mouth every 6 (six) hours as needed for moderate pain. ) 12 tablet 0   No current facility-administered medications for this visit.      ROS:  See HPI  Physical Exam:    Incision:  Well healed  Extremities:  Decreased sensation in the left UE, palpable radial pulse.  Grip 5/5.  No palpable or audible thrill in the graft.  Assessment/Plan:  This is a 48 y.o. female who is s/p: Occluded left  Artegraft   He last options are thigh graft. We will have her come back for ABI's and schedule her for thigh loop graft.     Roxy Horseman PA-C [facility] [phone]  Clinic MD:  Trula Slade

## 2019-11-04 DIAGNOSIS — I2699 Other pulmonary embolism without acute cor pulmonale: Secondary | ICD-10-CM

## 2019-11-04 HISTORY — DX: Other pulmonary embolism without acute cor pulmonale: I26.99

## 2019-11-05 ENCOUNTER — Other Ambulatory Visit: Payer: Self-pay

## 2019-11-05 ENCOUNTER — Encounter (HOSPITAL_COMMUNITY): Payer: Managed Care, Other (non HMO)

## 2019-11-05 DIAGNOSIS — I739 Peripheral vascular disease, unspecified: Secondary | ICD-10-CM

## 2019-11-05 DIAGNOSIS — N186 End stage renal disease: Secondary | ICD-10-CM

## 2019-11-05 MED ORDER — MEDI-TUSSIN DM DOUBLE STRENGTH 30-200 MG/5ML PO LIQD
1000.00 | ORAL | Status: DC
Start: ? — End: 2019-11-05

## 2019-11-05 MED ORDER — CARVEDILOL 3.125 MG PO TABS
6.25 | ORAL_TABLET | ORAL | Status: DC
Start: 2019-11-05 — End: 2019-11-05

## 2019-11-05 MED ORDER — STRI-DEX MAXIMUM STRENGTH 2 % EX PADS
125.00 | MEDICATED_PAD | CUTANEOUS | Status: DC
Start: ? — End: 2019-11-05

## 2019-11-05 MED ORDER — IPECAC PO
40.00 | ORAL | Status: DC
Start: ? — End: 2019-11-05

## 2019-11-05 MED ORDER — PHENYLEPHRINE-GUAIFENESIN 30-400 MG PO CP12
5.00 | ORAL_CAPSULE | ORAL | Status: DC
Start: 2019-11-06 — End: 2019-11-05

## 2019-11-05 MED ORDER — DOLACET PO
0.10 | ORAL | Status: DC
Start: ? — End: 2019-11-05

## 2019-11-05 MED ORDER — EQUATE NICOTINE 4 MG MT GUM
4.00 | CHEWING_GUM | OROMUCOSAL | Status: DC
Start: ? — End: 2019-11-05

## 2019-11-05 MED ORDER — GABAPENTIN 300 MG PO CAPS
300.00 | ORAL_CAPSULE | ORAL | Status: DC
Start: ? — End: 2019-11-05

## 2019-11-05 MED ORDER — HYDRALAZINE HCL 20 MG/ML IJ SOLN
10.00 | INTRAMUSCULAR | Status: DC
Start: ? — End: 2019-11-05

## 2019-11-05 MED ORDER — IPECAC PO
18.00 | ORAL | Status: DC
Start: ? — End: 2019-11-05

## 2019-11-05 MED ORDER — FLUTICASONE PROPIONATE 50 MCG/ACT NA SUSP
2.00 | NASAL | Status: DC
Start: ? — End: 2019-11-05

## 2019-11-05 MED ORDER — DIPHENHYDRAMINE HCL 25 MG PO CAPS
25.00 | ORAL_CAPSULE | ORAL | Status: DC
Start: ? — End: 2019-11-05

## 2019-11-05 MED ORDER — Medication
30.00 | Status: DC
Start: 2019-11-05 — End: 2019-11-05

## 2019-11-05 MED ORDER — GENERIC EXTERNAL MEDICATION
5.00 | Status: DC
Start: ? — End: 2019-11-05

## 2019-11-05 MED ORDER — GENERIC EXTERNAL MEDICATION
1.00 | Status: DC
Start: ? — End: 2019-11-05

## 2019-11-05 MED ORDER — LIDOCAINE 4 % EX PTCH
1.00 | MEDICATED_PATCH | CUTANEOUS | Status: DC
Start: 2019-11-06 — End: 2019-11-05

## 2019-11-05 MED ORDER — MIDODRINE HCL 5 MG PO TABS
5.00 | ORAL_TABLET | ORAL | Status: DC
Start: ? — End: 2019-11-05

## 2019-11-05 MED ORDER — GENERIC EXTERNAL MEDICATION
5.00 | Status: DC
Start: 2019-11-05 — End: 2019-11-05

## 2019-11-05 MED ORDER — IPECAC PO
80.00 | ORAL | Status: DC
Start: ? — End: 2019-11-05

## 2019-11-05 MED ORDER — INSULIN LISPRO 100 UNIT/ML ~~LOC~~ SOLN
2.00 | SUBCUTANEOUS | Status: DC
Start: 2019-11-05 — End: 2019-11-05

## 2019-11-05 MED ORDER — GENERIC EXTERNAL MEDICATION
1.00 | Status: DC
Start: 2019-11-05 — End: 2019-11-05

## 2019-11-05 MED ORDER — ACCU-PRO PUMP SET/VENT MISC
2.50 | Status: DC
Start: ? — End: 2019-11-05

## 2019-11-05 MED ORDER — GLUCAGON HCL RDNA (DIAGNOSTIC) 1 MG IJ SOLR
1.00 | INTRAMUSCULAR | Status: DC
Start: ? — End: 2019-11-05

## 2019-11-05 MED ORDER — DOCUSATE SODIUM 100 MG PO CAPS
100.00 | ORAL_CAPSULE | ORAL | Status: DC
Start: ? — End: 2019-11-05

## 2019-11-05 MED ORDER — EMBRACE PUMP SET-PIERCING PIN MISC
1.00 | Status: DC
Start: ? — End: 2019-11-05

## 2019-11-05 MED ORDER — FOSPHENYTOIN SODIUM 50 MG PE/ML IJ SOLN
15.00 | INTRAMUSCULAR | Status: DC
Start: ? — End: 2019-11-05

## 2019-11-06 LAB — VITAMIN B12: Vitamin B-12: 341

## 2019-11-09 DIAGNOSIS — A499 Bacterial infection, unspecified: Secondary | ICD-10-CM | POA: Insufficient documentation

## 2019-11-19 ENCOUNTER — Encounter: Payer: Self-pay | Admitting: Physician Assistant

## 2019-12-18 ENCOUNTER — Other Ambulatory Visit: Payer: Self-pay | Admitting: Physician Assistant

## 2019-12-18 MED ORDER — FLUTICASONE PROPIONATE HFA 220 MCG/ACT IN AERO: 2.0000 | INHALATION_SPRAY | Freq: Every day | RESPIRATORY_TRACT | 4 refills | Status: DC

## 2019-12-25 ENCOUNTER — Other Ambulatory Visit: Payer: Self-pay

## 2019-12-26 ENCOUNTER — Encounter: Payer: Self-pay | Admitting: Internal Medicine

## 2019-12-26 ENCOUNTER — Ambulatory Visit (INDEPENDENT_AMBULATORY_CARE_PROVIDER_SITE_OTHER): Payer: Managed Care, Other (non HMO) | Admitting: Internal Medicine

## 2019-12-26 VITALS — BP 138/88 | HR 88 | Temp 98.8°F | Ht 68.0 in | Wt 224.2 lb

## 2019-12-26 DIAGNOSIS — E1165 Type 2 diabetes mellitus with hyperglycemia: Secondary | ICD-10-CM

## 2019-12-26 DIAGNOSIS — E1122 Type 2 diabetes mellitus with diabetic chronic kidney disease: Secondary | ICD-10-CM

## 2019-12-26 DIAGNOSIS — Z794 Long term (current) use of insulin: Secondary | ICD-10-CM | POA: Insufficient documentation

## 2019-12-26 DIAGNOSIS — E1142 Type 2 diabetes mellitus with diabetic polyneuropathy: Secondary | ICD-10-CM

## 2019-12-26 DIAGNOSIS — E119 Type 2 diabetes mellitus without complications: Secondary | ICD-10-CM

## 2019-12-26 DIAGNOSIS — R739 Hyperglycemia, unspecified: Secondary | ICD-10-CM | POA: Diagnosis not present

## 2019-12-26 DIAGNOSIS — E785 Hyperlipidemia, unspecified: Secondary | ICD-10-CM

## 2019-12-26 DIAGNOSIS — Z992 Dependence on renal dialysis: Secondary | ICD-10-CM

## 2019-12-26 DIAGNOSIS — N186 End stage renal disease: Secondary | ICD-10-CM

## 2019-12-26 DIAGNOSIS — E1159 Type 2 diabetes mellitus with other circulatory complications: Secondary | ICD-10-CM

## 2019-12-26 DIAGNOSIS — E11319 Type 2 diabetes mellitus with unspecified diabetic retinopathy without macular edema: Secondary | ICD-10-CM

## 2019-12-26 LAB — POCT GLYCOSYLATED HEMOGLOBIN (HGB A1C): Hemoglobin A1C: 7.2 % — AB (ref 4.0–5.6)

## 2019-12-26 LAB — GLUCOSE, POCT (MANUAL RESULT ENTRY): POC Glucose: 248 mg/dl — AB (ref 70–99)

## 2019-12-26 MED ORDER — TRESIBA FLEXTOUCH 100 UNIT/ML ~~LOC~~ SOPN: 26.0000 [IU] | PEN_INJECTOR | Freq: Every day | SUBCUTANEOUS | 3 refills | Status: DC

## 2019-12-26 MED ORDER — NOVOLOG FLEXPEN 100 UNIT/ML ~~LOC~~ SOPN: 10.0000 [IU] | PEN_INJECTOR | Freq: Three times a day (TID) | SUBCUTANEOUS | 3 refills | Status: DC

## 2019-12-26 MED ORDER — FREESTYLE LIBRE 14 DAY SENSOR MISC: 1.0000 | 6 refills | Status: DC

## 2019-12-26 MED ORDER — INSULIN PEN NEEDLE 29G X 5MM MISC: 1.0000 | Freq: Four times a day (QID) | 3 refills | Status: DC

## 2019-12-26 NOTE — Patient Instructions (Signed)
-   Tresiba 26 units daily  - Novolog 10 units with each meal    - Check sugar before each meal and bedtime  - Bring meter on next visit     -HOW TO TREAT LOW BLOOD SUGARS (Blood sugar LESS THAN 70 MG/DL)  Please follow the RULE OF 15 for the treatment of hypoglycemia treatment (when your (blood sugars are less than 70 mg/dL)    STEP 1: Take 15 grams of carbohydrates when your blood sugar is low, which includes:   3-4 GLUCOSE TABS  OR  3-4 OZ OF JUICE OR REGULAR SODA OR  ONE TUBE OF GLUCOSE GEL     STEP 2: RECHECK blood sugar in 15 MINUTES STEP 3: If your blood sugar is still low at the 15 minute recheck --> then, go back to STEP 1 and treat AGAIN with another 15 grams of carbohydrates.

## 2019-12-26 NOTE — Progress Notes (Signed)
Name: Heather [name]  MRN/ DOB: [id number], July 02, 1971   Age/ Sex: 48 y.o., female    PCP: Inda Coke, PA   Reason for Endocrinology Evaluation: Type 2 Diabetes Mellitus     Date of Initial Endocrinology Visit: 12/26/2019     PATIENT IDENTIFIER: Heather [name] is a 48 y.o. female with a past medical history of T2DM, HTN,and ESRD on HD . The patient presented for initial endocrinology clinic visit on 12/26/2019 for consultative assistance with her diabetes management.    HPI: Heather [name] was    Diagnosed with DM at age 56  Prior Medications tried/Intolerance: Metformin- GI side effects. Has been on insulin for years  Currently checking blood sugars 0 x / day. Has a libre   Hypoglycemia episodes : no            Hemoglobin A1c has ranged from 6.4% in 2018, peaking at 12.9% in 2016. Patient required assistance for hypoglycemia: no  Patient has required hospitalization within the last 1 year from hyper or hypoglycemia: no   In terms of diet, the patient eats 2 meals a day, snacks "not often" . Drinks sugar-sweetened beverages.   HD started 06/2018 ( Tuesday, Thursday and Saturday) - Dr. Augustin Coupe  She was recently evaluated for transplantation- awaiting final decision  HOME DIABETES REGIMEN: Tresiba 28 units daily  Novolog 6 units TID QAC   Statin: no ACE-I/ARB: no Prior Diabetic Education: yes   METER DOWNLOAD SUMMARY: Did not bring a meter    DIABETIC COMPLICATIONS: Microvascular complications:   ESRD, Retinopathy and Neuropathy  Last eye exam: Completed 2019  Macrovascular complications:   CVA- affects walking   Denies: CAD, PVD   PAST HISTORY: Past Medical History:  Past Medical History:  Diagnosis Date  . Antiplatelet or antithrombotic long-term use: Plavix 06/30/2017  . B12 deficiency 05/10/2014  . Blood transfusion without reported diagnosis   . Chronic constipation   . CVA (cerebral vascular accident) ([place]), nonhemorrhagic,  inferior right cerebellum 12/03/2017   left side weakness in leg  . Cyst of left ovary 01/12/2018  . Diabetic neuropathy, painful ([place]), on low dose Gabapentin 07/13/2013  . DM (diabetes mellitus), type 2, uncontrolled, with renal complications ([place]) 59/56/3875  . DM2 (diabetes mellitus, type 2) ([place])   . Dysfunction of left eustachian tube, with pusatile tinnitus 11/24/2017  . Dyslipidemia associated with type 2 diabetes mellitus ([place]) 06/25/2016  . ESRD (end stage renal disease) on dialysis [facility])    On hemodialysis in July 2019 via [facility] then switched to [facility] in Nov 2019.   [name] ESRD with anemia ([place])   . Fibromyalgia   . Gastroparesis due to DM   . GERD (gastroesophageal reflux disease)   . Headache   . Hypertension associated with diabetes ([place]) 02/19/2011  . IBS (irritable bowel syndrome)   . Left-sided weakness 12/10/2017   .  [name] Leiomyoma of uterus   . Pancreatitis   . Pneumonia   . Proliferative diabetic retinopathy ([place]) 09/29/2015  . 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 Reactive depression 12/10/2017   .  [name] Right-sided low back pain without sciatica 12/08/2015  . Secondary hyperthyroidism 11/27/2013  . Steal syndrome dialysis vascular access ([place]) 02/17/2018  . Thromboembolism ([place]) 04/05/2018  . Upper airway cough syndrome, with recs to stay off ACE and take Pepcid q hs 11/15/2016   Past Surgical History:  Past Surgical History:  Procedure Laterality Date  . ARTERY REPAIR Right 02/17/2018   Procedure: EXPLORATION OF  RIGHT BRACHIAL ARTERY;  Surgeon: Conrad [place], MD;  Location: [place];  Service: Vascular;  Laterality: Right;  . ARTERY REPAIR Right 02/17/2018   Procedure: BRACHIAL ARTERY EXPLORATION AND TRHOMBECTOMY;  Surgeon: Conrad Moshannon, MD;  [facility];  Service: Vascular;  Laterality: Right;  . AV FISTULA PLACEMENT Left 05/09/2017   Procedure: INSERTION OF ARTERIOVENOUS (AV) GRAFT ARM (ARTEGRAFT);  Surgeon: Conrad [place], MD;  [facility];  Service: Vascular;   Laterality: Left;  . AV FISTULA PLACEMENT Right 02/17/2018   Procedure: INSERTION OF ARTERIOVENOUS (AV) GORE-TEX GRAFT ARM RIGHT UPPER ARM;  Surgeon: Conrad Swansboro, MD;  Location: [place];  Service: Vascular;  Laterality: Right;  . AV FISTULA PLACEMENT Left 10/10/2019   Procedure: INSERTION OF LEFT ARTERIOVENOUS (AV) ARTEGRAFT GRAFT ARM;  Surgeon: Waynetta Sandy, MD;  Location: [place];  Service: Vascular;  Laterality: Left;  . BASCILIC VEIN TRANSPOSITION Left 08/31/2016   Procedure: BASILIC VEIN TRANSPOSITION FIRST STAGE;  Surgeon: Conrad Moore, MD;  Location: [place];  Service: Vascular;  Laterality: Left;  . CENTRAL VENOUS CATHETER INSERTION Left 07/24/2018   Procedure: INSERTION CENTRAL LINE ADULT;  Surgeon: Conrad Isleton, MD;  Location: [place];  Service: Vascular;  Laterality: Left;  . EYE SURGERY     secondary to diabetic retinopathy   . FOOT SURGERY Right    t"ook bone out- maybe hammer toe"  . INSERTION OF DIALYSIS CATHETER Left 07/24/2018   Procedure: INSERTION OF TUNNELED DIALYSIS CATHETER;  Surgeon: Conrad Boonville, MD;  Location: [place];  Service: Vascular;  Laterality: Left;  . IR FLUORO GUIDE CV LINE RIGHT  07/21/2018  . IR FLUORO GUIDE CV LINE RIGHT  06/01/2019  . IR US GUIDE VASC ACCESS RIGHT  07/21/2018  . IR US GUIDE VASC ACCESS RIGHT  06/01/2019  . LIGATION OF ARTERIOVENOUS  FISTULA Left 05/09/2017   Procedure: LIGATION OF ARTERIOVENOUS  FISTULA;  Surgeon: Conrad [place]ta, MD;  Location: [place];  Service: Vascular;  Laterality: Left;  . LOOP RECORDER INSERTION N/A 12/05/2017   Procedure: LOOP RECORDER INSERTION;  Surgeon: Evans Lance, MD;  Location: [place] CV LAB;  Service: Cardiovascular;  Laterality: N/A;  . MYOMECTOMY    . REMOVAL OF GRAFT Right 02/17/2018   Procedure: REMOVAL OF RIGHT UPPER ARM ARTERIOVENOUS GRAFT;  Surgeon: Conrad Rives, MD;  Location: [place];  Service: Vascular;  Laterality: Right;  . REVISION OF ARTERIOVENOUS GORETEX GRAFT Left 07/24/2018   Procedure:  REDO ARTERIOVENOUS GORETEX GRAFT;  Surgeon: Conrad [place], MD;  Location: [place];  Service: Vascular;  Laterality: Left;  . TEE WITHOUT CARDIOVERSION N/A 12/05/2017   Procedure: TRANSESOPHAGEAL ECHOCARDIOGRAM (TEE);  Surgeon: Sanda Klein, MD;  Location[facility] ENDOSCOPY;  Service: Cardiovascular;  Laterality: N/A;  . UPPER EXTREMITY VENOGRAPHY Left 07/13/2018   Procedure: UPPER EXTREMITY VENOGRAPHY - Central & Left Arm;  Surgeon: Conrad , MD;  Location: [place] CV LAB;  Service: Cardiovascular;  Laterality: Left;  . UPPER EXTREMITY VENOGRAPHY Bilateral 09/10/2019   Procedure: UPPER EXTREMITY VENOGRAPHY;  Surgeon: Waynetta Sandy, MD;  Location: [place] CV LAB;  Service: Cardiovascular;  Laterality: Bilateral;  . UTERINE FIBROID SURGERY    . VITRECTOMY Bilateral       Social History:  reports that she has never smoked. She has never used smokeless tobacco. She reports that she does not drink alcohol or use drugs. Family History:  Family History  Problem Relation Age of Onset  .  Colon polyps Mother   . Diabetes Mother   . Heart murmur Father        history essentially unknown  . Hypertension Father   . Diabetes Sister   . Kidney failure Sister        kidney transplant  . Diabetes Brother   . Retinal degeneration Brother   . Heart murmur Son   . Stroke Paternal Grandmother   . Diabetes Sister      HOME MEDICATIONS: Allergies as of 12/26/2019      Reactions   Ibuprofen Other (See Comments)   CKD stage 3. Should avoid.   Dilaudid [hydromorphone Hcl] Itching, Other (See Comments)   Can take with Benadryl.   Tramadol Itching      Medication List       Accurate as of December 26, 2019 10:17 AM. If you have any questions, ask your nurse or doctor.        aspirin-acetaminophen-caffeine 250-250-65 MG tablet Commonly known as: EXCEDRIN MIGRAINE Take 1 tablet by mouth daily as needed for headache.   calcium acetate 667 MG capsule Commonly known as: PHOSLO  Take 667 mg by mouth 3 (three) times daily with meals.   carvedilol 12.5 MG tablet Commonly known as: COREG Take 6.25 mg by mouth daily.   fluticasone 220 MCG/ACT inhaler Commonly known as: FLOVENT HFA Inhale 2 puffs into the lungs daily.   FreeStyle Libre 14 Day Sensor Misc 1 patch by Does not apply route every 14 (fourteen) days.   gabapentin 300 MG capsule Commonly known as: NEURONTIN Take 300 mg by mouth 2 (two) times daily as needed (for pain).   gabapentin 100 MG capsule Commonly known as: NEURONTIN TAKE 1 CAPSULE BY MOUTH EVERY DAY   lubiprostone 24 MCG capsule Commonly known as: Amitiza Take 1 capsule (24 mcg total) by mouth 2 (two) times daily with a meal. What changed:   when to take this  reasons to take this   midodrine 5 MG tablet Commonly known as: PROAMATINE TAKE 1 TABLET (5 MG TOTAL) BY MOUTH 2 (TWO) TIMES DAILY WITH A MEAL. What changed:   when to take this  reasons to take this   NovoLOG FlexPen 100 UNIT/ML FlexPen Generic drug: insulin aspart Inject 6 Units into the skin daily. Sliding scale   oxyCODONE-acetaminophen 5-325 MG tablet Commonly known as: PERCOCET/ROXICET Take 1 tablet by mouth every 6 (six) hours as needed.   [name] FlexTouch 100 UNIT/ML Sopn FlexTouch Pen Generic drug: insulin degludec Inject 28 Units into the skin at bedtime.        ALLERGIES: Allergies  Allergen Reactions  . Ibuprofen Other (See Comments)    CKD stage 3. Should avoid.  . Dilaudid [Hydromorphone Hcl] Itching and Other (See Comments)    Can take with Benadryl.  . Tramadol Itching     REVIEW OF SYSTEMS: A comprehensive ROS was conducted with the patient and is negative except as per HPI and below:  Review of Systems  Constitutional: Negative for chills and fever.  HENT: Negative for congestion and sore throat.   Eyes: Negative for blurred vision and pain.  Respiratory: Negative for cough and shortness of breath.   Cardiovascular: Negative for  chest pain and palpitations.  Gastrointestinal: Negative for diarrhea and nausea.  Genitourinary: Negative for frequency.  Neurological: Positive for tingling. Negative for tremors.  Endo/Heme/Allergies: Positive for polydipsia.  Psychiatric/Behavioral: Negative for depression. The patient is not nervous/anxious.       OBJECTIVE:   VITAL SIGNS: BP  138/88 (BP Location: Right Arm, Patient Position: Sitting, Cuff Size: Large)   Pulse 88   Temp 98.8 F (37.1 C)   Ht 5\' 8"  (1.727 m)   Wt 224 lb 3.2 oz (101.7 kg)   SpO2 98%   BMI 34.09 kg/m    PHYSICAL EXAM:  General: Pt appears well and is in NAD with a walker  HEENT: Head: Unremarkable  Eyes: External eye exam normal without stare, lid lag or exophthalmos.  EOM intact.  .  Neck: General: Supple without adenopathy or carotid bruits. Thyroid: Thyroid size normal.  No goiter or nodules appreciated. No thyroid bruit.  Lungs: Clear with good BS bilat with no rales, rhonchi, or wheezes  Heart: RRR with normal S1 and S2 and no gallops; no murmurs; no rub  Abdomen: Normoactive bowel sounds, soft, nontender, without masses or organomegaly palpable  Extremities:  Lower extremities - No pretibial edema. No lesions.  Skin: Normal texture and temperature to palpation.   Neuro: MS is good with appropriate affect, pt is alert and Ox3    DM foot exam: 12/26/2019  The skin of the feet is without sores or ulcerations but pt with multiple callous formation especially on the lateral border of the right foot The pedal pulses are undetected on today's exam. The sensation is intact to a screening 5.07, 10 gram monofilament bilaterally   DATA REVIEWED:  Lab Results  Component Value Date   HGBA1C 7.2 (A) 12/26/2019   HGBA1C 7.3 11/02/2019   HGBA1C 9.1 (H) 05/31/2019   Lab Results  Component Value Date   MICROALBUR 99.5 (H) 04/11/2018   LDLCALC 169 (H) 12/03/2017   CREATININE 8.45 (H) 10/13/2019   Lab Results  Component Value Date    MICRALBCREAT 104.6 (H) 04/11/2018    Lab Results  Component Value Date   CHOL 249 (H) 12/03/2017   HDL 48 12/03/2017   LDLCALC 169 (H) 12/03/2017   LDLDIRECT 120.0 04/17/2015   TRIG 158 (H) 12/03/2017   CHOLHDL 5.2 12/03/2017        ASSESSMENT / PLAN / RECOMMENDATIONS:   1) Type 2 Diabetes Mellitus, Poorly controlled, With retinopathic, neuropathic complications  and ESRD on HD - Most recent A1c of 7.2 %. Goal A1c < 7.5 %.    - We discussed that her A1c is falsely skewed due to ESRD and procrit.  - Poorly controlled diabetes due to medication non adherence ( has not had consistent insulin use in months) and dietary indiscretions - We discussed the importance of glucose checks at home and the availability of this data to me. We refilled freestyle libre sensors - Discussed pharmacokinetics of basal/bolus insulin and the importance of taking prandial insulin with meals.  - We also discussed avoiding sugar-sweetened beverages and snacks, when possible.    MEDICATIONS: - Tresiba 26 units daily  - Novolog 10 units with each meal    EDUCATION / INSTRUCTIONS:  BG monitoring instructions: Patient is instructed to check her blood sugars 4 times a day, before meals and bedtime.  Call [facility] if: BG persistently < 70 or > 300. . I reviewed the Rule of 15 for the treatment of hypoglycemia in detail with the patient. Literature supplied.   2) Diabetic complications:   Eye: Does have known diabetic retinopathy.   Neuro/ Feet: Does have known diabetic peripheral neuropathy.  Renal: Patient does have known baseline CKD. [name] on HD   3) Lipids: No cardiovascular benefits of statin with pt on HD. Statins will  be indicated after transplant.    4) Hypertension: [name]  at goal of < 140/90 mmHg.    F/U in 3 months    Signed electronically by: Mack Guise, MD  [facility] Endocrinology  [address] [place]  [place], [place] [address] Phone: [phone] FAX: [phone]   CC: Inda Coke, [place] [address] Phone: [phone]  Fax: [phone]    Return to Endocrinology clinic as below: Future Appointments  Date Time Provider [place]  12/26/2019 10:30 AM Yakub Lodes, Melanie Crazier, MD [facility] None

## 2020-01-17 ENCOUNTER — Telehealth: Payer: Self-pay | Admitting: Physician Assistant

## 2020-01-17 NOTE — Telephone Encounter (Signed)
Please see message and advise 

## 2020-01-17 NOTE — Telephone Encounter (Signed)
Patient should reach out to her cardiologist, it looks like she sees: Charolett Bumpers, MD

## 2020-01-17 NOTE — Telephone Encounter (Signed)
Pt called asking to speak with Butch Penny about medication that she was put on while in the hospital, Eliquis. Pt states it is very expensive and was wondering if she could be put on another medication. Please advise.

## 2020-01-17 NOTE — Telephone Encounter (Signed)
Spoke to pt told her [facility]ona Bar said you need to contact Cardiology. Pt said she did and they said to contact PCP. Told her no it would be Cardiology cause they would monitor the medication and Aldona Bar did not prescribe it. Pt verbalized understanding.

## 2020-02-01 ENCOUNTER — Ambulatory Visit: Payer: Managed Care, Other (non HMO) | Admitting: Physician Assistant

## 2020-02-01 ENCOUNTER — Other Ambulatory Visit (HOSPITAL_COMMUNITY)
Admission: RE | Admit: 2020-02-01 | Discharge: 2020-02-01 | Disposition: A | Discharge status: Home / Self Care | Payer: Managed Care, Other (non HMO) | Source: Ambulatory Visit | Attending: Physician Assistant | Admitting: Physician Assistant

## 2020-02-01 ENCOUNTER — Ambulatory Visit (INDEPENDENT_AMBULATORY_CARE_PROVIDER_SITE_OTHER): Payer: Managed Care, Other (non HMO)

## 2020-02-01 ENCOUNTER — Ambulatory Visit (INDEPENDENT_AMBULATORY_CARE_PROVIDER_SITE_OTHER): Payer: Managed Care, Other (non HMO) | Admitting: Physician Assistant

## 2020-02-01 ENCOUNTER — Encounter: Payer: Self-pay | Admitting: Physician Assistant

## 2020-02-01 ENCOUNTER — Other Ambulatory Visit: Payer: Self-pay

## 2020-02-01 VITALS — BP 150/86 | HR 88 | Temp 97.2°F | Ht 68.0 in | Wt 225.5 lb

## 2020-02-01 DIAGNOSIS — L089 Local infection of the skin and subcutaneous tissue, unspecified: Secondary | ICD-10-CM | POA: Diagnosis not present

## 2020-02-01 DIAGNOSIS — N898 Other specified noninflammatory disorders of vagina: Secondary | ICD-10-CM

## 2020-02-01 DIAGNOSIS — T148XXA Other injury of unspecified body region, initial encounter: Secondary | ICD-10-CM | POA: Diagnosis not present

## 2020-02-01 DIAGNOSIS — E1140 Type 2 diabetes mellitus with diabetic neuropathy, unspecified: Secondary | ICD-10-CM

## 2020-02-01 LAB — POCT URINALYSIS DIPSTICK
Bilirubin, UA: POSITIVE
Blood, UA: POSITIVE
Glucose, UA: POSITIVE — AB
Ketones, UA: POSITIVE
Nitrite, UA: NEGATIVE
Protein, UA: POSITIVE — AB
Spec Grav, UA: 1.025 (ref 1.010–1.025)
Urobilinogen, UA: 0.2 E.U./dL
pH, UA: 5.5 (ref 5.0–8.0)

## 2020-02-01 MED ORDER — DOXYCYCLINE HYCLATE 100 MG PO TABS: 100.0000 mg | ORAL_TABLET | Freq: Two times a day (BID) | ORAL | 0 refills | Status: DC

## 2020-02-01 NOTE — Patient Instructions (Addendum)
It was great to see you!  Start oral doxycycline for your lower foot wounds.  1. Follow-up with me on Monday or Wednesday to re-evaluate your wounds UNLESS you can get in with wound care sooner.  2. Regardless, follow-up with me next week and I will review with your your hospital follow-up in the meantime.  I will be in touch with your xray results and the vaginal swab results.  Please make an appointment for Transfer of Care with Dr. Jerline Pain. I will be happy to see you until that time.  Take care,  Inda Coke PA-C

## 2020-02-01 NOTE — Progress Notes (Signed)
Heather STRENG is a 49 y.o. female here for a new problem.  I acted as a Education administrator for [employer], PA-C Anselmo Pickler, LPN  History of Present Illness:   Chief Complaint  Patient presents with  . Vaginal Discharge    HPI   Vaginal discharge Pt c/o white vaginal discharge x 2 weeks. Denies itch, odor, unusual bleeding. Does endorse lower abdominal pain but does have history of ongoing constipation. Also has history of yeast infection and BV. Has not tried anything for her symptoms.  Bilateral foot wounds Has bilateral diabetic wounds on feet. They are both on the lateral aspects of her feet. She has no feeling in bilateral feet. Wound on R foot was open and is now closed. Wound on L foot is now open with purulent drainage. Her husband has been dressing her wounds. Denies fevers, chills.   Past Medical History:  Diagnosis Date  . Antiplatelet or antithrombotic long-term use: Plavix 06/30/2017  . B12 deficiency 05/10/2014  . Blood transfusion without reported diagnosis   . Chronic constipation   . CVA (cerebral vascular accident) ([place]), nonhemorrhagic, inferior right cerebellum 12/03/2017   left side weakness in leg  . Cyst of left ovary 01/12/2018  . Diabetic neuropathy, painful ([place]), on low dose Gabapentin 07/13/2013  . DM (diabetes mellitus), type 2, uncontrolled, with renal complications ([place]) 56/38/9373  . DM2 (diabetes mellitus, type 2) ([place])   . Dysfunction of left eustachian tube, with pusatile tinnitus 11/24/2017  . Dyslipidemia associated with type 2 diabetes mellitus ([place]) 06/25/2016  . ESRD (end stage renal disease) on dialysis [facility])    On hemodialysis in July 2019 via [facility] then switched to [facility] in Nov 2019.   [name] ESRD with anemia ([place])   . Fibromyalgia   . Gastroparesis due to DM   . GERD (gastroesophageal reflux disease)   . Headache   . Hypertension associated with diabetes ([place]) 02/19/2011  . IBS (irritable bowel syndrome)   . Left-sided weakness 12/10/2017    .  [name] Leiomyoma of uterus   . Pancreatitis   . Pneumonia   . Proliferative diabetic retinopathy ([place]) 09/29/2015  . 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 Reactive depression 12/10/2017   .  [name] Right-sided low back pain without sciatica 12/08/2015  . Secondary hyperthyroidism 11/27/2013  . Steal syndrome dialysis vascular access ([place]) 02/17/2018  . Thromboembolism ([place]) 04/05/2018  . Upper airway cough syndrome, with recs to stay off ACE and take Pepcid q hs 11/15/2016     Social History   Socioeconomic History  . Marital status: Married    Spouse name: Not on file  . Number of children: 1  . Years of education: college  . Highest education level: Not on file  Occupational History  . Occupation: Armed forces technical officer: [employer]  Tobacco Use  . Smoking status: Never Smoker  . Smokeless tobacco: Never Used  Substance and Sexual Activity  . Alcohol use: No    Alcohol/week: 0.0 standard drinks  . Drug use: No  . Sexual activity: Not Currently    Partners: Male    Birth control/protection: I.U.D.  Other Topics Concern  . Not on file  Social History Narrative   Patient lives with fiance. She has 1 grown son. Works full-time, she Paramedic for attorney.   Caffeine Use: 2 cups daily; sodas occasionally   She has 7 grandchildren   Social Determinants of Radio broadcast assistant Strain:   .  Difficulty of Paying Living Expenses: Not on file  Food Insecurity:   . Worried About Charity fundraiser in the Last Year: Not on file  . Ran Out of Food in the Last Year: Not on file  Transportation Needs:   . Lack of Transportation (Medical): Not on file  . Lack of Transportation (Non-Medical): Not on file  Physical Activity:   . Days of Exercise per Week: Not on file  . Minutes of Exercise per Session: Not on file  Stress:   . Feeling of Stress : Not on file  Social Connections:   . Frequency of Communication with Friends and Family: Not on file  .  Frequency of Social Gatherings with Friends and Family: Not on file  . Attends Religious Services: Not on file  . Active Member of Clubs or Organizations: Not on file  . Attends Archivist Meetings: Not on file  . Marital Status: Not on file  Intimate Partner Violence:   . Fear of Current or Ex-Partner: Not on file  . Emotionally Abused: Not on file  . Physically Abused: Not on file  . Sexually Abused: Not on file    Past Surgical History:  Procedure Laterality Date  . ARTERY REPAIR Right 02/17/2018   Procedure: EXPLORATION OF RIGHT BRACHIAL ARTERY;  Surgeon: Conrad Pine Ridge, MD;  Location: [place];  Service: Vascular;  Laterality: Right;  . ARTERY REPAIR Right 02/17/2018   Procedure: BRACHIAL ARTERY EXPLORATION AND TRHOMBECTOMY;  Surgeon: Conrad Plentywood, MD;  Location: [facility] OR;  Service: Vascular;  Laterality: Right;  . AV FISTULA PLACEMENT Left 05/09/2017   Procedure: INSERTION OF ARTERIOVENOUS (AV) GRAFT ARM (ARTEGRAFT);  Surgeon: Conrad [place], MD;  Location: [facility] OR;  Service: Vascular;  Laterality: Left;  . AV FISTULA PLACEMENT Right 02/17/2018   Procedure: INSERTION OF ARTERIOVENOUS (AV) GORE-TEX GRAFT ARM RIGHT UPPER ARM;  Surgeon: Conrad Buchtel, MD;  Location: [place];  Service: Vascular;  Laterality: Right;  . AV FISTULA PLACEMENT Left 10/10/2019   Procedure: INSERTION OF LEFT ARTERIOVENOUS (AV) ARTEGRAFT GRAFT ARM;  Surgeon: Waynetta Sandy, MD;  Location: [place];  Service: Vascular;  Laterality: Left;  . BASCILIC VEIN TRANSPOSITION Left 08/31/2016   Procedure: BASILIC VEIN TRANSPOSITION FIRST STAGE;  Surgeon: Conrad Rockdale, MD;  Location: [place];  Service: Vascular;  Laterality: Left;  . CENTRAL VENOUS CATHETER INSERTION Left 07/24/2018   Procedure: INSERTION CENTRAL LINE ADULT;  Surgeon: Conrad Nelson, MD;  Location: [place];  Service: Vascular;  Laterality: Left;  . EYE SURGERY     secondary to diabetic retinopathy   . FOOT SURGERY Right    t"ook bone out- maybe hammer toe"   . INSERTION OF DIALYSIS CATHETER Left 07/24/2018   Procedure: INSERTION OF TUNNELED DIALYSIS CATHETER;  Surgeon: Conrad Virgin, MD;  Location: [place];  Service: Vascular;  Laterality: Left;  . IR FLUORO GUIDE CV LINE RIGHT  07/21/2018  . IR FLUORO GUIDE CV LINE RIGHT  06/01/2019  . IR US GUIDE VASC ACCESS RIGHT  07/21/2018  . IR US GUIDE VASC ACCESS RIGHT  06/01/2019  . LIGATION OF ARTERIOVENOUS  FISTULA Left 05/09/2017   Procedure: LIGATION OF ARTERIOVENOUS  FISTULA;  Surgeon: Conrad [place] Lakes, MD;  Location: [place];  Service: Vascular;  Laterality: Left;  . LOOP RECORDER INSERTION N/A 12/05/2017   Procedure: LOOP RECORDER INSERTION;  Surgeon: Evans Lance, MD;  Location: [place] CV LAB;  Service: Cardiovascular;  Laterality: N/A;  .  MYOMECTOMY    . REMOVAL OF GRAFT Right 02/17/2018   Procedure: REMOVAL OF RIGHT UPPER ARM ARTERIOVENOUS GRAFT;  Surgeon: Conrad Drexel, MD;  Location: [place];  Service: Vascular;  Laterality: Right;  . REVISION OF ARTERIOVENOUS GORETEX GRAFT Left 07/24/2018   Procedure: REDO ARTERIOVENOUS GORETEX GRAFT;  Surgeon: Conrad [place], MD;  Location: [place];  Service: Vascular;  Laterality: Left;  . TEE WITHOUT CARDIOVERSION N/A 12/05/2017   Procedure: TRANSESOPHAGEAL ECHOCARDIOGRAM (TEE);  Surgeon: Sanda Klein, MD;  Location[facility] ENDOSCOPY;  Service: Cardiovascular;  Laterality: N/A;  . UPPER EXTREMITY VENOGRAPHY Left 07/13/2018   Procedure: UPPER EXTREMITY VENOGRAPHY - Central & Left Arm;  Surgeon: Conrad , MD;  Location: [place] CV LAB;  Service: Cardiovascular;  Laterality: Left;  . UPPER EXTREMITY VENOGRAPHY Bilateral 09/10/2019   Procedure: UPPER EXTREMITY VENOGRAPHY;  Surgeon: Waynetta Sandy, MD;  Location: [place] CV LAB;  Service: Cardiovascular;  Laterality: Bilateral;  . UTERINE FIBROID SURGERY    . VITRECTOMY Bilateral     Family History  Problem Relation Age of Onset  . Colon polyps Mother   . Diabetes Mother   . Heart murmur Father         history essentially unknown  . Hypertension Father   . Diabetes Sister   . Kidney failure Sister        kidney transplant  . Diabetes Brother   . Retinal degeneration Brother   . Heart murmur Son   . Stroke Paternal Grandmother   . Diabetes Sister     Allergies  Allergen Reactions  . Ibuprofen Other (See Comments)    CKD stage 3. Should avoid.  . Dilaudid [Hydromorphone Hcl] Itching and Other (See Comments)    Can take with Benadryl.  . Tramadol Itching    Current Medications:   Current Outpatient Medications:  .  amLODipine (NORVASC) 10 MG tablet, Take 10 mg by mouth at bedtime., Disp: , Rfl:  .  aspirin-acetaminophen-caffeine (EXCEDRIN MIGRAINE) 250-250-65 MG tablet, Take 1 tablet by mouth daily as needed for headache., Disp: , Rfl:  .  calcium acetate (PHOSLO) 667 MG capsule, Take 667 mg by mouth 3 (three) times daily with meals. , Disp: , Rfl:  .  Continuous Blood Gluc Sensor (FREESTYLE LIBRE 14 DAY SENSOR) MISC, 1 patch by Does not apply route every 14 (fourteen) days., Disp: 2 each, Rfl: 6 .  ELIQUIS 2.5 MG TABS tablet, Take 2.5 mg by mouth 2 (two) times daily., Disp: , Rfl:  .  fluticasone (FLOVENT HFA) 220 MCG/ACT inhaler, Inhale 2 puffs into the lungs daily., Disp: 1 Inhaler, Rfl: 4 .  gabapentin (NEURONTIN) 100 MG capsule, TAKE 1 CAPSULE BY MOUTH EVERY DAY, Disp: 90 capsule, Rfl: 1 .  gabapentin (NEURONTIN) 300 MG capsule, Take 300 mg by mouth 2 (two) times daily as needed (for pain). , Disp: , Rfl:  .  insulin aspart (NOVOLOG FLEXPEN) 100 UNIT/ML FlexPen, Inject 10 Units into the skin 3 (three) times daily with meals. Sliding scale, Disp: 27 mL, Rfl: 3 .  insulin degludec (TRESIBA FLEXTOUCH) 100 UNIT/ML SOPN FlexTouch Pen, Inject 0.26 mLs (26 Units total) into the skin at bedtime., Disp: 24 mL, Rfl: 3 .  Insulin Pen Needle 29G X 5MM MISC, 1 Device by Does not apply route 4 (four) times daily., Disp: 400 each, Rfl: 3 .  lubiprostone (AMITIZA) 24 MCG capsule, Take 1  capsule (24 mcg total) by mouth 2 (two) times daily with a meal. (Patient taking differently:  Take 24 mcg by mouth as needed for constipation. ), Disp: , Rfl:  .  midodrine (PROAMATINE) 5 MG tablet, TAKE 1 TABLET (5 MG TOTAL) BY MOUTH 2 (TWO) TIMES DAILY WITH A MEAL. (Patient taking differently: Take 5 mg by mouth daily as needed (hypotension). ), Disp: 180 tablet, Rfl: 0 .  doxycycline (VIBRA-TABS) 100 MG tablet, Take 1 tablet (100 mg total) by mouth 2 (two) times daily., Disp: 20 tablet, Rfl: 0 .  oxyCODONE-acetaminophen (PERCOCET/ROXICET) 5-325 MG tablet, Take 1 tablet by mouth every 6 (six) hours as needed. (Patient not taking: Reported on 12/26/2019), Disp: 12 tablet, Rfl: 0   Review of Systems:   ROS  Negative unless otherwise specified per HPI.  Vitals:   Vitals:   02/01/20 1510  BP: (!) 150/86  Pulse: 88  Temp: (!) 97.2 F (36.2 C)  TempSrc: Temporal  SpO2: 98%  Weight: 225 lb 8 oz (102.3 kg)  Height: 5\' 8"  (1.727 m)     Body mass index is 34.29 kg/m.  Physical Exam:   Physical Exam Vitals and nursing note reviewed.  Constitutional:      General: [name] not in acute distress.    Appearance: [name] well-developed. [name] not ill-appearing or toxic-appearing.  Cardiovascular:     Rate and Rhythm: Normal rate and regular rhythm.     Heart sounds: Normal heart sounds, S1 normal and S2 normal.     Comments: Difficult to assess vascular status, pulses faint or absent Pulmonary:     Effort: Pulmonary effort is normal.     Breath sounds: Normal breath sounds.  Genitourinary:    Vagina: Vaginal discharge (white with small clumps) present.     Cervix: Normal.  Skin:    General: Skin is warm and dry.     Comments: R foot: darkened calloused area with very slight TTP on lateral aspect  L foot: opened lesion to lateral foot with active purulent drainage  Neurological:     Mental Status: [name] alert.     GCS: GCS eye subscore is 4. GCS verbal subscore is 5. GCS motor  subscore is 6.     Comments: Numbness to bilateral feet  Psychiatric:        Speech: Speech normal.        Behavior: Behavior normal. Behavior is cooperative.     Results for orders placed or performed in visit on 02/01/20  POCT urinalysis dipstick  Result Value Ref Range   Color, UA Golden    Clarity, UA Cloudy    Glucose, UA Positive (A) Negative   Bilirubin, UA Positive    Ketones, UA Positive    Spec Grav, UA 1.025 1.010 - 1.025   Blood, UA Positive    pH, UA 5.5 5.0 - 8.0   Protein, UA Positive (A) Negative   Urobilinogen, UA 0.2 0.2 or 1.0 E.U./dL   Nitrite, UA Negative    Leukocytes, UA Trace (A) Negative   Appearance     Odor yes     Assessment and Plan:   Tauheedah was seen today for vaginal discharge.  Diagnoses and all orders for this visit:  Vaginal discharge Swab obtained. Further intervention based upon results. -     POCT urinalysis dipstick -     Urine Culture -     Cervicovaginal ancillary only  Wound infection; Diabetic neuropathy, painful ([place]), on low dose Gabapentin Referral to podiatry and wound care. ABI's pending. Will start oral doxycycline to cover for infection. Follow-up  appointment has been scheduled with me for next week for wound check and hospital follow-up. Worsening precautions advised in the interim. -     DG Foot Complete Right; Future -     DG Foot Complete Left; Future -     VAS [name] ABI WITH/WO TBI; Future -     AMB referral to wound care center -     Ambulatory referral to Podiatry  Other orders -     doxycycline (VIBRA-TABS) 100 MG tablet; Take 1 tablet (100 mg total) by mouth 2 (two) times daily.    . Reviewed expectations re: course of current medical issues. . Discussed self-management of symptoms. . Outlined signs and symptoms indicating need for more acute intervention. . Patient verbalized understanding and all questions were answered. . See orders for this visit as documented in the electronic medical  record. . Patient received an After-Visit Summary.  CMA or LPN served as scribe during this visit. History, Physical, and Plan performed by medical provider. The above documentation has been reviewed and is accurate and complete.  Inda Coke, PA-C

## 2020-02-02 LAB — URINE CULTURE
MICRO NUMBER:: 10121773
SPECIMEN QUALITY:: ADEQUATE

## 2020-02-04 ENCOUNTER — Ambulatory Visit: Payer: Managed Care, Other (non HMO) | Admitting: Physician Assistant

## 2020-02-04 ENCOUNTER — Encounter: Payer: Self-pay | Admitting: Physician Assistant

## 2020-02-04 NOTE — Progress Notes (Deleted)
Heather [name] is a 49 y.o. female here for a {New prob or follow up:31724}.  SCRIBE STATEMENT  History of Present Illness:   No chief complaint on file.   HPI  Admitted at [facility] from 11/02/19 - 11/10/19. And was dx with bilateral PE, treated for bacteremia 2/2 perm cath infection.  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 Started and continued on heparin drip, transitioned to Eliquis. To continue Eliquis x 6 months.   Bacteremia - seen by ID in the hospital. Was recommended to be on cefazolin x 6 weeks.  Stable inflammatory/edematous changes around the infrarenal aorta and IVC - found on CT. 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 Follow- up CTA from [facility] showed largely resolved inflammatory process at her bifurcation. The plan is for her to have repeat U/S in several months after abx completed to assess for further evaluation/resolution.  Pericardial effusions - incidental finding on CT scan. 2D echo in the hospital shows circumferential effusion. On call cardiologist reviewed this, and stated that the effusion is small and likely of no significance.  Diabetes mellitus - diagnosed at age 8. Was started on oral medications, but has been on insulin for several years. Currently sees Dr. Kelton Pillar at [facility]. Last visit 12/26/19.  Currently on Tresiba 28 units daily and Novolong 6 units TID QAC. Uses freestyle libre sensors. Has follow-up in 3 months.  HTN - Currently on Norvasc 10 mg daily. At home blood pressure readings are: ***. Patient denies chest pain, SOB, blurred vision, dizziness, unusual headaches, lower leg swelling. Patient is *** compliant with medication. Denies excessive caffeine intake, stimulant usage, excessive alcohol intake, or increase in salt consumption.  BP Readings  from Last 3 Encounters:  02/01/20 (!) 150/86  12/26/19 138/88  11/02/19 (!) 143/83   ESRD on HD - first AVF placed in Sep 2017. She has undergone multiple bilateral upper extremity access procedures. Hemodialysis started during July 2019. Briefly did PD from Jan 2020 - June 2020 (was discontinued due to issues with PD access and peritonitis.) Has had multiple failed upper extremity grafts, she is contemplating thigh access.  CVA - Dec 2018 had a subacute stroke.  Proliferative Retinopathy  Transplant consideration - followed by the transplant clinic but per the last documentation, Jan 2021, patient is not candidate at this time due to "functional status, hospitalizations within the past 6 months" with recommendations on 15 lb weight loss.  Elevated ANA - during her hospitalization, ANA was checked and found to be positive, 1:320 "homogenous", ESR was >140. Has never seen rheumatology.  Diabetic ulcer of both feet - evaluated by me on 02/01/20. L foot wound >> R foot wound. Has scheduled ABI's from vascular next Monday. She has profound neuropathy in her feet.  B/L x-rays were obtained, "No acute fracture or dislocation of the bilateral feet. No bony erosion or sclerosis to suggest osteomyelitis. Consider MRI to more sensitively evaluate for bone marrow edema and osteomyelitis if suspected."   Past Medical History:  Diagnosis Date  . Antiplatelet or antithrombotic long-term use: Plavix 06/30/2017  . B12 deficiency 05/10/2014  . Blood transfusion without reported diagnosis   . Chronic constipation   . CVA (cerebral vascular accident) ([place]), nonhemorrhagic, inferior right cerebellum 12/03/2017   left side weakness in leg  . Cyst of left ovary 01/12/2018  . Diabetic neuropathy, painful ([place]),  on low dose Gabapentin 07/13/2013  . DM (diabetes mellitus), type 2, uncontrolled, with renal complications ([place]) 84/16/6063  . DM2 (diabetes mellitus, type 2) ([place])   . Dysfunction of left eustachian tube,  with pusatile tinnitus 11/24/2017  . Dyslipidemia associated with type 2 diabetes mellitus ([place]) 06/25/2016  . ESRD (end stage renal disease) on dialysis [facility])    On hemodialysis in July 2019 via [facility] then switched to [facility] in Nov 2019.   [name] ESRD with anemia ([place])   . Fibromyalgia   . Gastroparesis due to DM   . GERD (gastroesophageal reflux disease)   . Headache   . Hypertension associated with diabetes ([place]) 02/19/2011  . IBS (irritable bowel syndrome)   . Left-sided weakness 12/10/2017   .  [name] Leiomyoma of uterus   . Pancreatitis   . Pneumonia   . Proliferative diabetic retinopathy ([place]) 09/29/2015  . 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 Reactive depression 12/10/2017   .  [name] Right-sided low back pain without sciatica 12/08/2015  . Secondary hyperthyroidism 11/27/2013  . 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 Thromboembolism ([place]) 04/05/2018  . Upper airway cough syndrome, with recs to stay off ACE and take Pepcid q hs 11/15/2016     Social History   Socioeconomic History  . Marital status: Married    Spouse name: Not on file  . Number of children: 1  . Years of education: college  . Highest education level: Not on file  Occupational History  . Occupation: Armed forces technical officer: [employer]  Tobacco Use  . Smoking status: Never Smoker  . Smokeless tobacco: Never Used  Substance and Sexual Activity  . Alcohol use: No    Alcohol/week: 0.0 standard drinks  . Drug use: No  . Sexual activity: Not Currently    Partners: Male    Birth control/protection: I.U.D.  Other Topics Concern  . Not on file  Social History Narrative   Patient lives with fiance. She has 1 grown son. Works full-time, she Paramedic for attorney.   Caffeine Use: 2 cups daily; sodas occasionally   She has 7 grandchildren   Social Determinants of Health   Financial Resource Strain:   . Difficulty of Paying Living Expenses: Not on file  Food Insecurity:    . Worried About Charity fundraiser in the Last Year: Not on file  . Ran Out of Food in the Last Year: Not on file  Transportation Needs:   . Lack of Transportation (Medical): Not on file  . Lack of Transportation (Non-Medical): Not on file  Physical Activity:   . Days of Exercise per Week: Not on file  . Minutes of Exercise per Session: Not on file  Stress:   . Feeling of Stress : Not on file  Social Connections:   . Frequency of Communication with Friends and Family: Not on file  . Frequency of Social Gatherings with Friends and Family: Not on file  . Attends Religious Services: Not on file  . Active Member of Clubs or Organizations: Not on file  . Attends Archivist Meetings: Not on file  . Marital Status: Not on file  Intimate Partner Violence:   . Fear of Current or Ex-Partner: Not on file  . Emotionally Abused: Not on file  . Physically Abused: Not on file  . Sexually Abused: Not on file    Past Surgical History:  Procedure Laterality Date  . ARTERY REPAIR  Right 02/17/2018   Procedure: EXPLORATION OF RIGHT BRACHIAL ARTERY;  Surgeon: Conrad [place], MD;  Location: [place];  Service: Vascular;  Laterality: Right;  . ARTERY REPAIR Right 02/17/2018   Procedure: BRACHIAL ARTERY EXPLORATION AND TRHOMBECTOMY;  Surgeon: Conrad [place], MD;  Location: [facility] OR;  Service: Vascular;  Laterality: Right;  . AV FISTULA PLACEMENT Left 05/09/2017   Procedure: INSERTION OF ARTERIOVENOUS (AV) GRAFT ARM (ARTEGRAFT);  Surgeon: Conrad [place], MD;  Location: [facility] OR;  Service: Vascular;  Laterality: Left;  . AV FISTULA PLACEMENT Right 02/17/2018   Procedure: INSERTION OF ARTERIOVENOUS (AV) GORE-TEX GRAFT ARM RIGHT UPPER ARM;  Surgeon: Conrad [place], MD;  Location: [place];  Service: Vascular;  Laterality: Right;  . AV FISTULA PLACEMENT Left 10/10/2019   Procedure: INSERTION OF LEFT ARTERIOVENOUS (AV) ARTEGRAFT GRAFT ARM;  Surgeon: Waynetta Sandy, MD;  Location: [place];  Service: Vascular;   Laterality: Left;  . BASCILIC VEIN TRANSPOSITION Left 08/31/2016   Procedure: BASILIC VEIN TRANSPOSITION FIRST STAGE;  Surgeon: Conrad [place], MD;  Location: [place];  Service: Vascular;  Laterality: Left;  . CENTRAL VENOUS CATHETER INSERTION Left 07/24/2018   Procedure: INSERTION CENTRAL LINE ADULT;  Surgeon: Conrad [place], MD;  Location: [place];  Service: Vascular;  Laterality: Left;  . EYE SURGERY     secondary to diabetic retinopathy   . FOOT SURGERY Right    t"ook bone out- maybe hammer toe"  . INSERTION OF DIALYSIS CATHETER Left 07/24/2018   Procedure: INSERTION OF TUNNELED DIALYSIS CATHETER;  Surgeon: Conrad [place], MD;  Location: [place];  Service: Vascular;  Laterality: Left;  . IR FLUORO GUIDE CV LINE RIGHT  07/21/2018  . IR FLUORO GUIDE CV LINE RIGHT  06/01/2019  . IR US GUIDE VASC ACCESS RIGHT  07/21/2018  . IR US GUIDE VASC ACCESS RIGHT  06/01/2019  . LIGATION OF ARTERIOVENOUS  FISTULA Left 05/09/2017   Procedure: LIGATION OF ARTERIOVENOUS  FISTULA;  Surgeon: Conrad [place], MD;  Location: [place];  Service: Vascular;  Laterality: Left;  . LOOP RECORDER INSERTION N/A 12/05/2017   Procedure: LOOP RECORDER INSERTION;  Surgeon: Evans Lance, MD;  Location: [place] CV LAB;  Service: Cardiovascular;  Laterality: N/A;  . MYOMECTOMY    . REMOVAL OF GRAFT Right 02/17/2018   Procedure: REMOVAL OF RIGHT UPPER ARM ARTERIOVENOUS GRAFT;  Surgeon: Conrad [place], MD;  Location: [place];  Service: Vascular;  Laterality: Right;  . REVISION OF ARTERIOVENOUS GORETEX GRAFT Left 07/24/2018   Procedure: REDO ARTERIOVENOUS GORETEX GRAFT;  Surgeon: Conrad [place], MD;  Location: [place];  Service: Vascular;  Laterality: Left;  . TEE WITHOUT CARDIOVERSION N/A 12/05/2017   Procedure: TRANSESOPHAGEAL ECHOCARDIOGRAM (TEE);  Surgeon: Sanda Klein, MD;  Location: [facility] ENDOSCOPY;  Service: Cardiovascular;  Laterality: N/A;  . UPPER EXTREMITY VENOGRAPHY Left 07/13/2018   Procedure: UPPER EXTREMITY VENOGRAPHY - Central & Left  Arm;  Surgeon: Conrad , MD;  Location: [place] CV [facility];  Service: Cardiovascular;  Laterality: Left;  . UPPER EXTREMITY VENOGRAPHY Bilateral 09/10/2019   Procedure: UPPER EXTREMITY VENOGRAPHY;  Surgeon: Waynetta Sandy, MD;  Location: [place] CV LAB;  Service: Cardiovascular;  Laterality: Bilateral;  . UTERINE FIBROID SURGERY    . VITRECTOMY Bilateral     Family History  Problem Relation Age of Onset  . Colon polyps Mother   . Diabetes Mother   . Heart murmur Father        history essentially unknown  .  Hypertension Father   . Diabetes Sister   . Kidney failure Sister        kidney transplant  . Diabetes Brother   . Retinal degeneration Brother   . Heart murmur Son   . Stroke Paternal Grandmother   . Diabetes Sister     Allergies  Allergen Reactions  . Ibuprofen Other (See Comments)    CKD stage 3. Should avoid.  . Dilaudid [Hydromorphone Hcl] Itching and Other (See Comments)    Can take with Benadryl.  . Tramadol Itching    Current Medications:   Current Outpatient Medications:  .  amLODipine (NORVASC) 10 MG tablet, Take 10 mg by mouth at bedtime., Disp: , Rfl:  .  aspirin-acetaminophen-caffeine (EXCEDRIN MIGRAINE) 250-250-65 MG tablet, Take 1 tablet by mouth daily as needed for headache., Disp: , Rfl:  .  calcium acetate (PHOSLO) 667 MG capsule, Take 667 mg by mouth 3 (three) times daily with meals. , Disp: , Rfl:  .  Continuous Blood Gluc Sensor (FREESTYLE LIBRE 14 DAY SENSOR) MISC, 1 patch by Does not apply route every 14 (fourteen) days., Disp: 2 each, Rfl: 6 .  doxycycline (VIBRA-TABS) 100 MG tablet, Take 1 tablet (100 mg total) by mouth 2 (two) times daily., Disp: 20 tablet, Rfl: 0 .  ELIQUIS 2.5 MG TABS tablet, Take 2.5 mg by mouth 2 (two) times daily., Disp: , Rfl:  .  fluticasone (FLOVENT HFA) 220 MCG/ACT inhaler, Inhale 2 puffs into the lungs daily., Disp: 1 Inhaler, Rfl: 4 .  gabapentin (NEURONTIN) 100 MG capsule, TAKE 1 CAPSULE BY MOUTH  EVERY DAY, Disp: 90 capsule, Rfl: 1 .  gabapentin (NEURONTIN) 300 MG capsule, Take 300 mg by mouth 2 (two) times daily as needed (for pain). , Disp: , Rfl:  .  insulin aspart (NOVOLOG FLEXPEN) 100 UNIT/ML FlexPen, Inject 10 Units into the skin 3 (three) times daily with meals. Sliding scale, Disp: 27 mL, Rfl: 3 .  insulin degludec (TRESIBA FLEXTOUCH) 100 UNIT/ML SOPN FlexTouch Pen, Inject 0.26 mLs (26 Units total) into the skin at bedtime., Disp: 24 mL, Rfl: 3 .  Insulin Pen Needle 29G X 5MM MISC, 1 Device by Does not apply route 4 (four) times daily., Disp: 400 each, Rfl: 3 .  lubiprostone (AMITIZA) 24 MCG capsule, Take 1 capsule (24 mcg total) by mouth 2 (two) times daily with a meal. (Patient taking differently: Take 24 mcg by mouth as needed for constipation. ), Disp: , Rfl:  .  midodrine (PROAMATINE) 5 MG tablet, TAKE 1 TABLET (5 MG TOTAL) BY MOUTH 2 (TWO) TIMES DAILY WITH A MEAL. (Patient taking differently: Take 5 mg by mouth daily as needed (hypotension). ), Disp: 180 tablet, Rfl: 0 .  oxyCODONE-acetaminophen (PERCOCET/ROXICET) 5-325 MG tablet, Take 1 tablet by mouth every 6 (six) hours as needed. (Patient not taking: Reported on 12/26/2019), Disp: 12 tablet, Rfl: 0   Review of Systems:   ROS  Vitals:   There were no vitals filed for this visit.   There is no height or weight on file to calculate BMI.  Physical Exam:   Physical Exam  Results for orders placed or performed in visit on 02/01/20  Urine Culture   Specimen: Urine  Result Value Ref Range   MICRO NUMBER: [id number]    SPECIMEN QUALITY: Adequate    Sample Source URINE    STATUS: FINAL    Result:      Growth of mixed flora was isolated, suggesting probable contamination. No further testing will be  performed. If clinically indicated, recollection using a method to minimize contamination, with prompt transfer to Urine Culture Transport Tube, is  recommended.   POCT urinalysis dipstick  Result Value Ref Range   Color,  UA Golden    Clarity, UA Cloudy    Glucose, UA Positive (A) Negative   Bilirubin, UA Positive    Ketones, UA Positive    Spec Grav, UA 1.025 1.010 - 1.025   Blood, UA Positive    pH, UA 5.5 5.0 - 8.0   Protein, UA Positive (A) Negative   Urobilinogen, UA 0.2 0.2 or 1.0 E.U./dL   Nitrite, UA Negative    Leukocytes, UA Trace (A) Negative   Appearance     Odor yes     Assessment and Plan:   There are no diagnoses linked to this encounter.  . Reviewed expectations re: course of current medical issues. . Discussed self-management of symptoms. . Outlined signs and symptoms indicating need for more acute intervention. . Patient verbalized understanding and all questions were answered. . See orders for this visit as documented in the electronic medical record. . Patient received an After-Visit Summary.  ***  Inda Coke, PA-C

## 2020-02-05 ENCOUNTER — Other Ambulatory Visit: Payer: Self-pay | Admitting: Physician Assistant

## 2020-02-05 ENCOUNTER — Other Ambulatory Visit: Payer: Self-pay

## 2020-02-05 LAB — CERVICOVAGINAL ANCILLARY ONLY
Bacterial Vaginitis (gardnerella): POSITIVE — AB
Candida Glabrata: POSITIVE — AB
Candida Vaginitis: NEGATIVE
Comment: NEGATIVE
Comment: NEGATIVE
Comment: NEGATIVE

## 2020-02-05 MED ORDER — FLUCONAZOLE 150 MG PO TABS: 150.0000 mg | ORAL_TABLET | Freq: Once | ORAL | 0 refills | Status: AC

## 2020-02-05 MED ORDER — METRONIDAZOLE 0.75 % VA GEL: 1.0000 | Freq: Every day | VAGINAL | 0 refills | Status: AC

## 2020-02-06 ENCOUNTER — Encounter: Payer: Self-pay | Admitting: Physician Assistant

## 2020-02-06 ENCOUNTER — Ambulatory Visit (INDEPENDENT_AMBULATORY_CARE_PROVIDER_SITE_OTHER): Payer: Managed Care, Other (non HMO) | Admitting: Physician Assistant

## 2020-02-06 VITALS — BP 156/90 | HR 85 | Temp 97.6°F | Ht 68.0 in | Wt 222.5 lb

## 2020-02-06 DIAGNOSIS — I2699 Other pulmonary embolism without acute cor pulmonale: Secondary | ICD-10-CM | POA: Diagnosis not present

## 2020-02-06 DIAGNOSIS — I3139 Other pericardial effusion (noninflammatory): Secondary | ICD-10-CM

## 2020-02-06 DIAGNOSIS — R2681 Unsteadiness on feet: Secondary | ICD-10-CM

## 2020-02-06 DIAGNOSIS — R7881 Bacteremia: Secondary | ICD-10-CM

## 2020-02-06 DIAGNOSIS — I776 Arteritis, unspecified: Secondary | ICD-10-CM | POA: Diagnosis not present

## 2020-02-06 DIAGNOSIS — Z992 Dependence on renal dialysis: Secondary | ICD-10-CM

## 2020-02-06 DIAGNOSIS — I313 Pericardial effusion (noninflammatory): Secondary | ICD-10-CM | POA: Diagnosis not present

## 2020-02-06 DIAGNOSIS — E1122 Type 2 diabetes mellitus with diabetic chronic kidney disease: Secondary | ICD-10-CM

## 2020-02-06 DIAGNOSIS — E083599 Diabetes mellitus due to underlying condition with proliferative diabetic retinopathy without macular edema, unspecified eye: Secondary | ICD-10-CM

## 2020-02-06 DIAGNOSIS — R768 Other specified abnormal immunological findings in serum: Secondary | ICD-10-CM

## 2020-02-06 DIAGNOSIS — N186 End stage renal disease: Secondary | ICD-10-CM

## 2020-02-06 DIAGNOSIS — T148XXA Other injury of unspecified body region, initial encounter: Secondary | ICD-10-CM

## 2020-02-06 DIAGNOSIS — Z8673 Personal history of transient ischemic attack (TIA), and cerebral infarction without residual deficits: Secondary | ICD-10-CM

## 2020-02-06 DIAGNOSIS — L089 Local infection of the skin and subcutaneous tissue, unspecified: Secondary | ICD-10-CM

## 2020-02-06 MED ORDER — AMOXICILLIN-POT CLAVULANATE 250-125 MG PO TABS: 1.0000 | ORAL_TABLET | Freq: Two times a day (BID) | ORAL | 0 refills | Status: AC

## 2020-02-06 NOTE — Progress Notes (Signed)
Heather [name] is a 49 y.o. female here for a follow up of a pre-existing problem.  I acted as a Education administrator for [employer], PA-C Anselmo Pickler, LPN  History of Present Illness:   Chief Complaint  Patient presents with  . Hospitalization Follow-up  . Wound Check    HPI   Admitted to [facility] from 11/02/19 - 11/10/19, was dx with bilateral PE, treated for bacteremia 2/2 perm cath infection.  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 Started and continued on heparin drip, transitioned to Eliquis. To continue Eliquis x 6 months. She remains on this presently, it is $10/mo with a voucher that she has.  Bacteremia - seen by ID in the hospital. Was recommended to be on cefazolin x 6 weeks. She received this while at HD. She has completed this course.  Stable inflammatory/edematous changes around the infrarenal aorta and IVC - found on CT. 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 Follow- up CTA from [facility] showed largely resolved inflammatory process at her bifurcation. The plan is for her to have repeat U/S in several months after abx completed to assess for further evaluation/resolution.  Pericardial effusion - incidental finding on CT scan. 2D echo in the hospital shows circumferential effusion. On call cardiologist reviewed this, and stated that the effusion is small and likely of no significance.  Diabetes mellitus - diagnosed at age 35. Was started on oral medications, but has been on insulin for several years. Currently sees Dr. Kelton Pillar at [facility]. Last visit 12/26/19.  Currently on Tresiba 28 units daily and Novolong 6 units TID QAC. Uses freestyle libre sensors. Has follow-up in 3 months.  ESRD on HD - first AVF placed in Sep 2017. She has undergone multiple bilateral upper extremity access  procedures. Hemodialysis started during July 2019. Briefly did PD from Jan 2020 - June 2020 (was discontinued due to issues with PD access and peritonitis.) Has had multiple failed upper extremity grafts, she is contemplating thigh access. Sees Dr. Augustin Coupe with [facility], as her nephrologist.  CVA - Dec 2018 had a subacute stroke. Walking worsened after this. [name] interested in PT to help with this.  Proliferative Retinopathy  -- sees eye doctor, Dr. Gerarda Fraction, regularly.  Transplant consideration - followed by the transplant clinic but per the last documentation, Jan 2021, patient is not candidate at this time due to "functional status, hospitalizations within the past 6 months" with recommendations on 15 lb weight loss.  Elevated ANA - during her hospitalization, ANA was checked and found to be positive, 1:320 "homogenous", ESR was >140. Has never seen rheumatology.  Diabetic ulcer of both feet - evaluated by me on 02/01/20. L foot wound >> R foot wound. Has scheduled ABI's from vascular next Monday. She has profound neuropathy in her feet.  B/L x-rays were obtained, "No acute fracture or dislocation of the bilateral feet. No bony erosion or sclerosis to suggest osteomyelitis. Consider MRI to more sensitively evaluate for bone marrow edema and osteomyelitis if suspected." She has been taking oral doxycyline as prescribed. Denies: fever, chills. Has no sensation in her feet.   Past Medical History:  Diagnosis Date  . Antiplatelet or antithrombotic long-term use: Plavix 06/30/2017  . B12 deficiency 05/10/2014  . Blood transfusion without reported diagnosis   . Chronic constipation   . CVA (cerebral vascular accident) ([place]), nonhemorrhagic,  inferior right cerebellum 12/03/2017   left side weakness in leg  . Cyst of left ovary 01/12/2018  . Diabetic neuropathy, painful ([place]), on low dose Gabapentin 07/13/2013  . DM (diabetes mellitus), type 2, uncontrolled, with renal complications ([place]) 35/57/3220  .  DM2 (diabetes mellitus, type 2) ([place])   . Dysfunction of left eustachian tube, with pusatile tinnitus 11/24/2017  . Dyslipidemia associated with type 2 diabetes mellitus ([place]) 06/25/2016  . ESRD (end stage renal disease) on dialysis [facility])    On hemodialysis in July 2019 via [facility] then switched to [facility] in Nov 2019.   [name] ESRD with anemia ([place])   . Fibromyalgia   . Gastroparesis due to DM   . GERD (gastroesophageal reflux disease)   . Headache   . Hypertension associated with diabetes ([place]) 02/19/2011  . IBS (irritable bowel syndrome)   . Left-sided weakness 12/10/2017   .  [name] Leiomyoma of uterus   . Pancreatitis   . Pneumonia   . Proliferative diabetic retinopathy ([place]) 09/29/2015  . 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 Reactive depression 12/10/2017   .  [name] Right-sided low back pain without sciatica 12/08/2015  . Secondary hyperthyroidism 11/27/2013  . 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 Thromboembolism ([employer]) 04/05/2018  . Upper airway cough syndrome, with recs to stay off ACE and take Pepcid q hs 11/15/2016     Social History   Socioeconomic History  . Marital status: Married    Spouse name: Not on file  . Number of children: 1  . Years of education: college  . Highest education level: Not on file  Occupational History  . Occupation: Armed forces technical officer: [employer]  Tobacco Use  . Smoking status: Never Smoker  . Smokeless tobacco: Never Used  Substance and Sexual Activity  . Alcohol use: No    Alcohol/week: 0.0 standard drinks  . Drug use: No  . Sexual activity: Not Currently    Partners: Male    Birth control/protection: I.U.D.  Other Topics Concern  . Not on file  Social History Narrative   Patient lives with fiance. She has 1 grown son. Works full-time, she Paramedic for attorney.   Caffeine Use: 2 cups daily; sodas occasionally   She has 7 grandchildren   Social Determinants of Health   Financial Resource  Strain:   . Difficulty of Paying Living Expenses: Not on file  Food Insecurity:   . Worried About Charity fundraiser in the Last Year: Not on file  . Ran Out of Food in the Last Year: Not on file  Transportation Needs:   . Lack of Transportation (Medical): Not on file  . Lack of Transportation (Non-Medical): Not on file  Physical Activity:   . Days of Exercise per Week: Not on file  . Minutes of Exercise per Session: Not on file  Stress:   . Feeling of Stress : Not on file  Social Connections:   . Frequency of Communication with Friends and Family: Not on file  . Frequency of Social Gatherings with Friends and Family: Not on file  . Attends Religious Services: Not on file  . Active Member of Clubs or Organizations: Not on file  . Attends Archivist Meetings: Not on file  . Marital Status: Not on file  Intimate Partner Violence:   . Fear of Current or Ex-Partner: Not on file  . Emotionally Abused: Not on file  . Physically Abused:  Not on file  . Sexually Abused: Not on file    Past Surgical History:  Procedure Laterality Date  . ARTERY REPAIR Right 02/17/2018   Procedure: EXPLORATION OF RIGHT BRACHIAL ARTERY;  Surgeon: Conrad Balmville, MD;  Location: [place];  Service: Vascular;  Laterality: Right;  . ARTERY REPAIR Right 02/17/2018   Procedure: BRACHIAL ARTERY EXPLORATION AND TRHOMBECTOMY;  Surgeon: Conrad P[place]own, MD;  Location: [facility] OR;  Service: Vascular;  Laterality: Right;  . AV FISTULA PLACEMENT Left 05/09/2017   Procedure: INSERTION OF ARTERIOVENOUS (AV) GRAFT ARM (ARTEGRAFT);  Surgeon: Conrad Ludlow, MD;  Location: [facility] OR;  Service: Vascular;  Laterality: Left;  . AV FISTULA PLACEMENT Right 02/17/2018   Procedure: INSERTION OF ARTERIOVENOUS (AV) GORE-TEX GRAFT ARM RIGHT UPPER ARM;  Surgeon: Conrad Bristol, MD;  Location: [place];  Service: Vascular;  Laterality: Right;  . AV FISTULA PLACEMENT Left 10/10/2019   Procedure: INSERTION OF LEFT ARTERIOVENOUS (AV) ARTEGRAFT GRAFT  ARM;  Surgeon: Waynetta Sandy, MD;  Location: [place];  Service: Vascular;  Laterality: Left;  . BASCILIC VEIN TRANSPOSITION Left 08/31/2016   Procedure: BASILIC VEIN TRANSPOSITION FIRST STAGE;  Surgeon: Conrad Burnsville, MD;  Location: [place];  Service: Vascular;  Laterality: Left;  . CENTRAL VENOUS CATHETER INSERTION Left 07/24/2018   Procedure: INSERTION CENTRAL LINE ADULT;  Surgeon: Conrad [place]opolis, MD;  Location: [place];  Service: Vascular;  Laterality: Left;  . EYE SURGERY     secondary to diabetic retinopathy   . FOOT SURGERY Right    t"ook bone out- maybe hammer toe"  . INSERTION OF DIALYSIS CATHETER Left 07/24/2018   Procedure: INSERTION OF TUNNELED DIALYSIS CATHETER;  Surgeon: Conrad [place], MD;  Location: [place];  Service: Vascular;  Laterality: Left;  . IR FLUORO GUIDE CV LINE RIGHT  07/21/2018  . IR FLUORO GUIDE CV LINE RIGHT  06/01/2019  . IR US GUIDE VASC ACCESS RIGHT  07/21/2018  . IR US GUIDE VASC ACCESS RIGHT  06/01/2019  . LIGATION OF ARTERIOVENOUS  FISTULA Left 05/09/2017   Procedure: LIGATION OF ARTERIOVENOUS  FISTULA;  Surgeon: Conrad Ashley, MD;  Location: [place];  Service: Vascular;  Laterality: Left;  . LOOP RECORDER INSERTION N/A 12/05/2017   Procedure: LOOP RECORDER INSERTION;  Surgeon: Evans Lance, MD;  Location: [place] CV LAB;  Service: Cardiovascular;  Laterality: N/A;  . MYOMECTOMY    . REMOVAL OF GRAFT Right 02/17/2018   Procedure: REMOVAL OF RIGHT UPPER ARM ARTERIOVENOUS GRAFT;  Surgeon: Conrad Casa Blanca, MD;  Location: [place];  Service: Vascular;  Laterality: Right;  . REVISION OF ARTERIOVENOUS GORETEX GRAFT Left 07/24/2018   Procedure: REDO ARTERIOVENOUS GORETEX GRAFT;  Surgeon: Conrad [place]le[place] Cliffs, MD;  Location: [place];  Service: Vascular;  Laterality: Left;  . TEE WITHOUT CARDIOVERSION N/A 12/05/2017   Procedure: TRANSESOPHAGEAL ECHOCARDIOGRAM (TEE);  Surgeon: Sanda Klein, MD;  Location: [facility] ENDOSCOPY;  Service: Cardiovascular;  Laterality: N/A;  . UPPER EXTREMITY  VENOGRAPHY Left 07/13/2018   Procedure: UPPER EXTREMITY VENOGRAPHY - Central & Left Arm;  Surgeon: Conrad , MD;  Location: [place] CV LAB;  Service: Cardiovascular;  Laterality: Left;  . UPPER EXTREMITY VENOGRAPHY Bilateral 09/10/2019   Procedure: UPPER EXTREMITY VENOGRAPHY;  Surgeon: Waynetta Sandy, MD;  Location: [place] CV LAB;  Service: Cardiovascular;  Laterality: Bilateral;  . UTERINE FIBROID SURGERY    . VITRECTOMY Bilateral     Family History  Problem Relation Age of Onset  .  Colon polyps Mother   . Diabetes Mother   . Heart murmur Father        history essentially unknown  . Hypertension Father   . Diabetes Sister   . Kidney failure Sister        kidney transplant  . Diabetes Brother   . Retinal degeneration Brother   . Heart murmur Son   . Stroke Paternal Grandmother   . Diabetes Sister     Allergies  Allergen Reactions  . Ibuprofen Other (See Comments)    CKD stage 3. Should avoid.  . Dilaudid [Hydromorphone Hcl] Itching and Other (See Comments)    Can take with Benadryl.  . Tramadol Itching    Current Medications:   Current Outpatient Medications:  .  amLODipine (NORVASC) 10 MG tablet, Take 10 mg by mouth at bedtime., Disp: , Rfl:  .  aspirin-acetaminophen-caffeine (EXCEDRIN MIGRAINE) 250-250-65 MG tablet, Take 1 tablet by mouth daily as needed for headache., Disp: , Rfl:  .  calcium acetate (PHOSLO) 667 MG capsule, Take 667 mg by mouth 3 (three) times daily with meals. , Disp: , Rfl:  .  Continuous Blood Gluc Sensor (FREESTYLE LIBRE 14 DAY SENSOR) MISC, 1 patch by Does not apply route every 14 (fourteen) days., Disp: 2 each, Rfl: 6 .  doxycycline (VIBRA-TABS) 100 MG tablet, Take 1 tablet (100 mg total) by mouth 2 (two) times daily., Disp: 20 tablet, Rfl: 0 .  ELIQUIS 2.5 MG TABS tablet, Take 2.5 mg by mouth 2 (two) times daily., Disp: , Rfl:  .  fluticasone (FLOVENT HFA) 220 MCG/ACT inhaler, Inhale 2 puffs into the lungs daily., Disp: 1  Inhaler, Rfl: 4 .  gabapentin (NEURONTIN) 100 MG capsule, TAKE 1 CAPSULE BY MOUTH EVERY DAY, Disp: 90 capsule, Rfl: 1 .  gabapentin (NEURONTIN) 300 MG capsule, Take 300 mg by mouth 2 (two) times daily as needed (for pain). , Disp: , Rfl:  .  insulin aspart (NOVOLOG FLEXPEN) 100 UNIT/ML FlexPen, Inject 10 Units into the skin 3 (three) times daily with meals. Sliding scale, Disp: 27 mL, Rfl: 3 .  insulin degludec (TRESIBA FLEXTOUCH) 100 UNIT/ML SOPN FlexTouch Pen, Inject 0.26 mLs (26 Units total) into the skin at bedtime., Disp: 24 mL, Rfl: 3 .  Insulin Pen Needle 29G X 5MM MISC, 1 Device by Does not apply route 4 (four) times daily., Disp: 400 each, Rfl: 3 .  lubiprostone (AMITIZA) 24 MCG capsule, Take 1 capsule (24 mcg total) by mouth 2 (two) times daily with a meal. (Patient taking differently: Take 24 mcg by mouth as needed for constipation. ), Disp: , Rfl:  .  metroNIDAZOLE (METROGEL) 0.75 % vaginal gel, Place 1 Applicatorful vaginally daily for 5 days., Disp: 50 g, Rfl: 0 .  midodrine (PROAMATINE) 5 MG tablet, TAKE 1 TABLET (5 MG TOTAL) BY MOUTH 2 (TWO) TIMES DAILY WITH A MEAL. (Patient taking differently: Take 5 mg by mouth daily as needed (hypotension). ), Disp: 180 tablet, Rfl: 0 .  oxyCODONE-acetaminophen (PERCOCET/ROXICET) 5-325 MG tablet, Take 1 tablet by mouth every 6 (six) hours as needed., Disp: 12 tablet, Rfl: 0 .  amoxicillin-clavulanate (AUGMENTIN) 250-125 MG tablet, Take 1 tablet by mouth 2 (two) times daily for 10 days., Disp: 20 tablet, Rfl: 0   Review of Systems:   ROS  Negative unless otherwise specified per HPI.  Vitals:   Vitals:   02/06/20 1048  BP: (!) 156/90  Pulse: 85  Temp: 97.6 F (36.4 C)  TempSrc: Temporal  SpO2: 97%  Weight: 222 lb 8 oz (100.9 kg)  Height: '5\' 8"'  (1.727 m)     Body mass index is 33.83 kg/m.  Physical Exam:   Physical Exam Vitals and nursing note reviewed.  Constitutional:      General: [name] not in acute distress.    Appearance:  [name] well-developed. [name] not ill-appearing or toxic-appearing.  Cardiovascular:     Rate and Rhythm: Normal rate and regular rhythm.     Pulses: Normal pulses.     Heart sounds: Normal heart sounds, S1 normal and S2 normal.     Comments: No LE edema Pulmonary:     Effort: Pulmonary effort is normal.     Breath sounds: Normal breath sounds.  Skin:    General: Skin is warm and dry.     Comments: Healed wound to R lateral edge of foot; open purulently draining wound to L lateral edge of foot  Neurological:     Mental Status: [name] alert.     GCS: GCS eye subscore is 4. GCS verbal subscore is 5. GCS motor subscore is 6.     Comments: No perceived sensation to bilateral feet  Psychiatric:        Speech: Speech normal.        Behavior: Behavior normal. Behavior is cooperative.      Assessment and Plan:   Jenifer was seen today for hospitalization follow-up and wound check.  Diagnoses and all orders for this visit:  Bilateral pulmonary embolism (HCC) Continue Eliquis x at least 6 months. [name] tolerating this without any significant concerns or bleeding.  Bacteremia Resolved. No red flags on discussion or concerns at this time.  Aortitis (Major) Management per Vascular.  Pericardial effusion Per cardiology, no follow-up needed.  Type 2 diabetes mellitus with end-stage renal disease ([place]) Management per endo.  ESRD on dialysis [facility]) Management per nephrology.  Positive ANA (antinuclear antibody) Significantly elevated ANA and ESR; patient requesting referral to rheumatology for further evaluation.  -     Ambulatory referral to Rheumatology  Gait instability; History of cerebrovascular accident (CVA) involving cerebellum -     Ambulatory referral to Physical Therapy  Proliferative diabetic retinopathy associated with diabetes mellitus due to underlying condition, macular edema presence unspecified, unspecified laterality ([place]) Management per ophtho.  Wound  infection Concern for non-healing wounds. Xray recommended consideration of MRI to r/o osteomyelitis which we discussed today and she is agreeable. [name] scheduled to have ABI's per Dr. Crissie Reese with [facility] next week. Will add on Augmentin to Doxycycline for further coverage. Recommend close follow-up if worsening wound in the interim. Appointment for wound care made for later this month per patient report. -     MR FOOT LEFT WO CONTRAST; Future -     MR FOOT RIGHT WO CONTRAST; Future  Other orders -     amoxicillin-clavulanate (AUGMENTIN) 250-125 MG tablet; Take 1 tablet by mouth 2 (two) times daily for 10 days.    . Reviewed expectations re: course of current medical issues. . Discussed self-management of symptoms. . Outlined signs and symptoms indicating need for more acute intervention. . Patient verbalized understanding and all questions were answered. . See orders for this visit as documented in the electronic medical record. . Patient received an After-Visit Summary.  CMA or LPN served as scribe during this visit. History, Physical, and Plan performed by medical provider. The above documentation has been reviewed and is accurate and complete.  This appointment required 60 minutes of  patient care (this includes precharting, chart review, review of results, face-to-face care, etc.).    Inda Coke, PA-C

## 2020-02-06 NOTE — Patient Instructions (Signed)
It was great to see you!  Start augmentin antibiotic (in addition to doxycycline)  If wound looks worse, let me know  You will be contacted about scheduling your foot MRI and your rheumatology appointment  Take care,  Inda Coke PA-C

## 2020-02-08 ENCOUNTER — Telehealth (HOSPITAL_COMMUNITY): Payer: Self-pay

## 2020-02-08 NOTE — Telephone Encounter (Signed)

## 2020-02-11 ENCOUNTER — Ambulatory Visit (INDEPENDENT_AMBULATORY_CARE_PROVIDER_SITE_OTHER): Payer: Managed Care, Other (non HMO) | Admitting: Physician Assistant

## 2020-02-11 ENCOUNTER — Other Ambulatory Visit: Payer: Self-pay

## 2020-02-11 ENCOUNTER — Ambulatory Visit (HOSPITAL_COMMUNITY)
Admission: RE | Admit: 2020-02-11 | Discharge: 2020-02-11 | Disposition: A | Discharge status: Home / Self Care | Payer: Managed Care, Other (non HMO) | Source: Ambulatory Visit | Attending: Surgery | Admitting: Surgery

## 2020-02-11 VITALS — BP 196/105 | HR 100 | Temp 97.2°F | Resp 16 | Ht 69.0 in | Wt 224.0 lb

## 2020-02-11 DIAGNOSIS — N186 End stage renal disease: Secondary | ICD-10-CM | POA: Diagnosis present

## 2020-02-11 DIAGNOSIS — L97529 Non-pressure chronic ulcer of other part of left foot with unspecified severity: Secondary | ICD-10-CM

## 2020-02-11 DIAGNOSIS — I739 Peripheral vascular disease, unspecified: Secondary | ICD-10-CM | POA: Insufficient documentation

## 2020-02-11 DIAGNOSIS — L97509 Non-pressure chronic ulcer of other part of unspecified foot with unspecified severity: Secondary | ICD-10-CM | POA: Insufficient documentation

## 2020-02-11 DIAGNOSIS — Z992 Dependence on renal dialysis: Secondary | ICD-10-CM | POA: Diagnosis not present

## 2020-02-11 NOTE — Progress Notes (Addendum)
Established Dialysis Access   History of Present Illness   BETHANEY OSHANA is a 49 y.o. (04-Apr-1971) female who presents for re-evaluation for permanent access.  She has had several fistula and graft attempts of bilateral upper extremities.  Most recently had Artegraft left arm which had to be removed due to steal.  She also experienced right hand ischemia after fistula creation which required thromboembolectomy of right brachial, radial, and ulnar artery emergently.  Plan for her was to return to office with ABIs for consideration of thigh AV graft.  She recently had her right IJ [facility] exchanged and this is now her fourth or fifth TDC per patient.  TDC is now working well without any difficulty.  From chart review she was being evaluated by [facility] vascular surgery for aortitis however states she completed her treatment of IV antibiotics during dialysis several weeks ago and does not have any follow-up with that group.  Past medical history significant for insulin-dependent diabetes mellitus.  She has a left lateral foot ulceration for which she is on doxycycline.  [name] also on Eliquis for a pulmonary embolism that she was hospitalized for at the end of last year.  She denies any nonhealing wounds of right foot however has had on and off ulcerations of lateral foot at pressure-point with shoe.  The patient's PMH, PSH, SH, and FamHx were reviewed on and are unchanged from prior visit.  Current Outpatient Medications  Medication Sig Dispense Refill  . amLODipine (NORVASC) 10 MG tablet Take 10 mg by mouth at bedtime.    [name] amoxicillin-clavulanate (AUGMENTIN) 250-125 MG tablet Take 1 tablet by mouth 2 (two) times daily for 10 days. 20 tablet 0  . aspirin-acetaminophen-caffeine (EXCEDRIN MIGRAINE) 250-250-65 MG tablet Take 1 tablet by mouth daily as needed for headache.    . calcium acetate (PHOSLO) 667 MG capsule Take 667 mg by mouth 3 (three) times daily with meals.     . Continuous Blood  Gluc Sensor (FREESTYLE LIBRE 14 DAY SENSOR) MISC 1 patch by Does not apply route every 14 (fourteen) days. 2 each 6  . doxycycline (VIBRA-TABS) 100 MG tablet Take 1 tablet (100 mg total) by mouth 2 (two) times daily. 20 tablet 0  . ELIQUIS 2.5 MG TABS tablet Take 2.5 mg by mouth 2 (two) times daily.    . fluticasone (FLOVENT HFA) 220 MCG/ACT inhaler Inhale 2 puffs into the lungs daily. 1 Inhaler 4  . gabapentin (NEURONTIN) 100 MG capsule TAKE 1 CAPSULE BY MOUTH EVERY DAY 90 capsule 1  . gabapentin (NEURONTIN) 300 MG capsule Take 300 mg by mouth 2 (two) times daily as needed (for pain).     . insulin aspart (NOVOLOG FLEXPEN) 100 UNIT/ML FlexPen Inject 10 Units into the skin 3 (three) times daily with meals. Sliding scale 27 mL 3  . insulin degludec (TRESIBA FLEXTOUCH) 100 UNIT/ML SOPN FlexTouch Pen Inject 0.26 mLs (26 Units total) into the skin at bedtime. 24 mL 3  . Insulin Pen Needle 29G X 5MM MISC 1 Device by Does not apply route 4 (four) times daily. 400 each 3  . lubiprostone (AMITIZA) 24 MCG capsule Take 1 capsule (24 mcg total) by mouth 2 (two) times daily with a meal. (Patient taking differently: Take 24 mcg by mouth as needed for constipation. )    . midodrine (PROAMATINE) 5 MG tablet TAKE 1 TABLET (5 MG TOTAL) BY MOUTH 2 (TWO) TIMES DAILY WITH A MEAL. (Patient taking differently: Take 5 mg by  mouth daily as needed (hypotension). ) 180 tablet 0  . oxyCODONE-acetaminophen (PERCOCET/ROXICET) 5-325 MG tablet Take 1 tablet by mouth every 6 (six) hours as needed. (Patient not taking: Reported on 02/11/2020) 12 tablet 0   No current facility-administered medications for this visit.    On ROS today: 10 system ROS is negative unless otherwise noted in HPI   Physical Examination   Vitals:   02/11/20 1031  BP: (!) 196/105  Pulse: 100  Resp: 16  Temp: (!) 97.2 F (36.2 C)  TempSrc: Temporal  SpO2: 100%  Weight: 224 lb (101.6 kg)  Height: 5\' 9"  (1.753 m)   Body mass index is 33.08  kg/m.  General Alert, O x 3, WD, NAD  Pulmonary Sym exp, good B air movt,  Cardiac RRR, Nl S1, S2,  Vascular Vessel Right Left  Radial Palpable Palpable    Musculo- skeletal Palpable L DP pulse; unable to palpate R pedal pulses M/S 5/5 throughout  , Extremities without ischemic changes    Neurologic A&O; CN grossly intact     Non-invasive Vascular Imaging   ABI  R: 0.73 with TBI of 0.86  L: 0.94 with TBI of 0.73    Medical Decision Making   ELFRIEDE BONINI is a 49 y.o. female who presents with ESRD requiring hemodialysis.    Complicated dialysis access patient with exhausted options of bilateral upper extremities  Left DP is palpable however she does have a ulceration on her lateral foot for which she is still on doxycycline  Right ABI 0.73; likely SFA occlusion   She has completed her IV antibiotic treatment for bacteremia and for aortitis however I do not see a follow-up study of aortoiliac system  I have tentatively scheduled patient for right thigh AV graft about 3-4 weeks from now however I will need to discuss this case with a surgeon given recent treatment of aortitis, and ongoing treatment of left foot ulceration.  Patient is aware that she will be notified if this surgery will be cancelled/postponed until all infectious treatment has been completed given that AV thigh graft will be PTFE  Addendum 02/14/20 Discussed case with Dr. Donzetta Matters.  Will hold off on thigh graft placement for now given likely R SFA occlusion and L foot wound on doxycycline.  Plan will be for R sided venogram on 03/03/20 to evaluate for possibility of R axillary artery loop graft after infectious treatment has been completed.  I will notify the patient of this change in plan by telephone.  Dagoberto Ligas PA-C [facility] Office: [phone]  Clinic MD: Dr. Oneida Alar on call

## 2020-02-12 ENCOUNTER — Other Ambulatory Visit: Payer: Self-pay

## 2020-02-18 ENCOUNTER — Encounter (HOSPITAL_BASED_OUTPATIENT_CLINIC_OR_DEPARTMENT_OTHER): Payer: Managed Care, Other (non HMO) | Admitting: Internal Medicine

## 2020-02-18 ENCOUNTER — Other Ambulatory Visit: Payer: Self-pay

## 2020-02-18 ENCOUNTER — Encounter (HOSPITAL_BASED_OUTPATIENT_CLINIC_OR_DEPARTMENT_OTHER): Payer: Managed Care, Other (non HMO) | Attending: Internal Medicine | Admitting: Internal Medicine

## 2020-02-18 DIAGNOSIS — M797 Fibromyalgia: Secondary | ICD-10-CM | POA: Diagnosis not present

## 2020-02-18 DIAGNOSIS — Z8249 Family history of ischemic heart disease and other diseases of the circulatory system: Secondary | ICD-10-CM | POA: Insufficient documentation

## 2020-02-18 DIAGNOSIS — M7989 Other specified soft tissue disorders: Secondary | ICD-10-CM | POA: Diagnosis not present

## 2020-02-18 DIAGNOSIS — E1140 Type 2 diabetes mellitus with diabetic neuropathy, unspecified: Secondary | ICD-10-CM | POA: Insufficient documentation

## 2020-02-18 DIAGNOSIS — Z885 Allergy status to narcotic agent status: Secondary | ICD-10-CM | POA: Diagnosis not present

## 2020-02-18 DIAGNOSIS — Z888 Allergy status to other drugs, medicaments and biological substances status: Secondary | ICD-10-CM | POA: Insufficient documentation

## 2020-02-18 DIAGNOSIS — E1151 Type 2 diabetes mellitus with diabetic peripheral angiopathy without gangrene: Secondary | ICD-10-CM | POA: Diagnosis not present

## 2020-02-18 DIAGNOSIS — L97522 Non-pressure chronic ulcer of other part of left foot with fat layer exposed: Secondary | ICD-10-CM | POA: Diagnosis not present

## 2020-02-18 DIAGNOSIS — E1142 Type 2 diabetes mellitus with diabetic polyneuropathy: Secondary | ICD-10-CM | POA: Insufficient documentation

## 2020-02-18 DIAGNOSIS — Z886 Allergy status to analgesic agent status: Secondary | ICD-10-CM | POA: Diagnosis not present

## 2020-02-18 DIAGNOSIS — Z823 Family history of stroke: Secondary | ICD-10-CM | POA: Diagnosis not present

## 2020-02-18 DIAGNOSIS — I252 Old myocardial infarction: Secondary | ICD-10-CM | POA: Diagnosis not present

## 2020-02-18 DIAGNOSIS — Z833 Family history of diabetes mellitus: Secondary | ICD-10-CM | POA: Diagnosis not present

## 2020-02-18 DIAGNOSIS — E11319 Type 2 diabetes mellitus with unspecified diabetic retinopathy without macular edema: Secondary | ICD-10-CM | POA: Insufficient documentation

## 2020-02-18 DIAGNOSIS — K589 Irritable bowel syndrome without diarrhea: Secondary | ICD-10-CM | POA: Insufficient documentation

## 2020-02-18 DIAGNOSIS — I120 Hypertensive chronic kidney disease with stage 5 chronic kidney disease or end stage renal disease: Secondary | ICD-10-CM | POA: Diagnosis not present

## 2020-02-18 DIAGNOSIS — E1122 Type 2 diabetes mellitus with diabetic chronic kidney disease: Secondary | ICD-10-CM | POA: Insufficient documentation

## 2020-02-18 DIAGNOSIS — E11621 Type 2 diabetes mellitus with foot ulcer: Secondary | ICD-10-CM | POA: Diagnosis not present

## 2020-02-18 DIAGNOSIS — N186 End stage renal disease: Secondary | ICD-10-CM | POA: Insufficient documentation

## 2020-02-18 DIAGNOSIS — Z992 Dependence on renal dialysis: Secondary | ICD-10-CM | POA: Diagnosis not present

## 2020-02-18 DIAGNOSIS — E669 Obesity, unspecified: Secondary | ICD-10-CM | POA: Diagnosis not present

## 2020-02-18 DIAGNOSIS — Z6833 Body mass index (BMI) 33.0-33.9, adult: Secondary | ICD-10-CM | POA: Diagnosis not present

## 2020-02-19 NOTE — Progress Notes (Signed)
Heather, [name] ([id number]) Visit Report for 02/18/2020 Abuse/Suicide Risk Screen Details Patient Name: Heather [name], Heather [name] 02/18/2020 10:30 Date of Service: AM Medical Record [id number] Number: Patient Account Number: [id number] Treating RN: 04/03/1971 (49 y.o. Carlene Coria Date of Birth/Sex: Female) Other Clinician: Primary Care Tasnia Spegal: Inda Coke Treating Linton Ham Referring Edythe Riches: Krithika Tome/Extender: Inda Coke Weeks in Treatment: 0 Abuse/Suicide Risk Screen Items Answer ABUSE RISK SCREEN: Has anyone close to you tried to hurt or harm you recentlyo No Do you feel uncomfortable with anyone in your familyo No Has anyone forced you do things that you didnt want to doo No Electronic Signature(s) Signed: 02/19/2020 6:06:02 PM By: Carlene Coria RN Entered By: Carlene Coria on 02/18/2020 10:29:25 -------------------------------------------------------------------------------- Activities of Daily Living Details Patient Name: Heather, [name] 02/18/2020 10:30 Date of Service: AM Medical Record [id number] Number: Patient Account Number: [id number] Treating RN: 03-17-1971 (49 y.o. Carlene Coria Date of Birth/Sex: Female) Other Clinician: Primary Care Koralynn Greenspan: Inda Coke Treating Linton Ham Referring Aaronmichael Brumbaugh: Conny Situ/Extender: Inda Coke Weeks in Treatment: 0 Activities of Daily Living Items Answer Activities of Daily Living (Please select one for each item) Drive Automobile Completely Able Take Medications Completely Able Use Telephone Completely Able Care for Appearance Completely Able Use Toilet Completely Able Bath / Shower Completely Able Dress Self Completely Able Feed Self Completely Able Walk Completely Able Get In / Out Bed Completely Able Housework Completely Able Prepare Meals Completely [place] Completely Able Shop for Self Completely Able Electronic Signature(s) Signed: 02/19/2020 6:06:02 PM  By: Carlene Coria RN Entered By: Carlene Coria on 02/18/2020 10:30:05 -------------------------------------------------------------------------------- Education Screening Details Patient Name: Heather [name]. 02/18/2020 10:30 Date of Service: AM Medical Record [id number] Number: Patient Account Number: [id number] Treating RN: June 17, 1971 (50 y.o. Carlene Coria Date of Birth/Sex: Female) Other Clinician: Primary Care Silveria Botz: Inda Coke Treating Linton Ham Referring Darlis Wragg: Cherrise Occhipinti/Extender: Gaspar Garbe in Treatment: 0 Primary Learner Assessed: Patient Learning Preferences/Education Level/Primary Language Learning Preference: Explanation Highest Education Level: College or Above Preferred Language: English Cognitive Barrier Language Barrier: No Translator Needed: No Memory Deficit: No Cultural/Religious Beliefs Affecting Medical Care: No Physical Barrier Impaired Vision: No Impaired Hearing: No Decreased Hand dexterity: No Knowledge/Comprehension Knowledge Level: Medium Comprehension Level: High Ability to understand written High instructions: Ability to understand verbal High instructions: Motivation Anxiety Level: Calm Cooperation: Cooperative Education Importance: Acknowledges Need Interest in Health Problems: Asks Questions Perception: Coherent Willingness to Engage in Self- High Management Activities: Readiness to Engage in Self- High Management Activities: Electronic Signature(s) Signed: 02/19/2020 6:06:02 PM By: Carlene Coria RN Entered By: Carlene Coria on 02/18/2020 10:30:58 -------------------------------------------------------------------------------- Fall Risk Assessment Details Patient Name: Heather [name] 02/18/2020 10:30 Date of Service: AM Medical Record [id number] Number: Patient Account Number: [id number] Treating RN: March 22, 1971 (49 y.o. Carlene Coria Date of Birth/Sex: Female) Other  Clinician: Primary Care Clarece Drzewiecki: Inda Coke Treating Linton Ham Referring Raelynne Ludwick: Quintel Mccalla/Extender: Inda Coke Weeks in Treatment: 0 Fall Risk Assessment Items Have you had 2 or more falls in the last 12 monthso 0 No Have you had any fall that resulted in injury in the last 12 monthso 0 No FALLS RISK SCREEN History of falling - immediate or within 3 months 0 No Secondary diagnosis (Do you have 2 or more medical diagnoseso) 0 No Ambulatory aid None/bed rest/wheelchair/nurse 0 No Crutches/cane/walker 0 No Furniture 0 No Intravenous therapy Access/Saline/Heparin Lock 0 No Gait/Transferring Normal/ bed rest/ wheelchair 0 No Impaired (short steps with shuffle, may have difficulty arising from chair, 0 No head down, impaired balance)  Mental Status Oriented to own ability 0 No Overestimates or forgets limitations 0 No Risk Level: Low Risk Score: 0 Electronic Signature(s) Signed: 02/19/2020 6:06:02 PM By: Carlene Coria RN Entered By: Carlene Coria on 02/18/2020 10:31:05 -------------------------------------------------------------------------------- Foot Assessment Details Patient Name: Heather [name]. 02/18/2020 10:30 Date of Service: AM Medical Record [id number] Number: Patient Account Number: [id number] Treating RN: Jul 22, 1971 (49 y.o. Carlene Coria Date of Birth/Sex: Female) Other Clinician: Primary Care Shalona Harbour: Inda Coke Treating Linton Ham Referring Ashlyn Cabler: Haely Leyland/Extender: Inda Coke Weeks in Treatment: 0 Foot Assessment Items Site Locations + = Sensation present, - = Sensation absent, C = Callus, U = Ulcer R = Redness, W = Warmth, M = Maceration, PU = Pre-ulcerative lesion F = Fissure, S = Swelling, D = Dryness Assessment Right: Left: Other Deformity: No No Prior Foot Ulcer: No No Prior Amputation: No No Charcot Joint: No No Ambulatory Status: Ambulatory Without Help Gait: Steady Electronic Signature(s) Signed:  02/19/2020 6:06:02 PM By: Carlene Coria RN Entered By: Carlene Coria on 02/18/2020 10:33:53 -------------------------------------------------------------------------------- Nutrition Risk Screening Details Patient Name: Heather, [name] 02/18/2020 10:30 Date of Service: AM Medical Record [id number] Number: Patient Account Number: [id number] Treating RN: 1971/07/24 (49 y.o. Carlene Coria Date of Birth/Sex: Female) Other Clinician: Primary Care Marilynn Ekstein: Inda Coke Treating Linton Ham Referring Tiffiany Beadles: Kemo Spruce/Extender: Inda Coke Weeks in Treatment: 0 Height (in): 69 Weight (lbs): 225 Body Mass Index (BMI): 33.2 Nutrition Risk Screening Items Score Screening NUTRITION RISK SCREEN: I have an illness or condition that made me change the kind and/or 0 No amount of food I eat I eat fewer than two meals per day 0 No I eat few fruits and vegetables, or milk products 0 No I have three or more drinks of beer, liquor or wine almost every day 0 No I have tooth or mouth problems that make it hard for me to eat 0 No I don't always have enough money to buy the food I need 0 No I eat alone most of the time 0 No I take three or more different prescribed or over-the-counter drugs a day 1 Yes 2 Yes Without wanting to, I have lost or gained 10 pounds in the last six months I am not always physically able to shop, cook and/or feed myself 0 No Nutrition Protocols Good Risk Protocol Provide education on Moderate Risk Protocol 0 nutrition High Risk Proctocol Risk Level: Moderate Risk Score: 3 Electronic Signature(s) Signed: 02/19/2020 6:06:02 PM By: Carlene Coria RN Entered By: Carlene Coria on 02/18/2020 10:31:26

## 2020-02-21 NOTE — Progress Notes (Signed)
Heather [name] ([id number]) Visit Report for 02/18/2020 Chief Complaint Document Details Patient Name: Heather [name], Heather [name] 02/18/2020 10:30 Date of Service: AM Medical Record [id number] Number: Patient Account Number: [id number] Treating RN: 11/21/1971 (49 y.o. [name] Date of Birth/Sex: Female) Other Clinician: Primary Care Provider: Inda Coke Treating Linton Ham Referring Provider: Provider/Extender: Inda Coke Weeks in Treatment: 0 Information Obtained from: Patient Chief Complaint patient is here for follow-up evaluation of ulcerations to the right lateral foot and right lateral malleolus 02/18/2020; patient is here for review of bilateral foot ulcers Electronic Signature(s) Signed: 02/18/2020 5:55:43 PM By: Linton Ham MD Entered By: Linton Ham on 02/18/2020 11:17:49 -------------------------------------------------------------------------------- Debridement Details Patient Name: Heather Medicus K. 02/18/2020 10:30 Date of Service: AM Medical Record [id number] Number: Patient Account Number: [id number] Treating RN: 10/05/1971 (49 y.o. [name] Date of Birth/Sex: Female) Other Clinician: Primary Care Provider: Inda Coke Treating Linton Ham Referring Provider: Provider/Extender: Inda Coke Weeks in Treatment: 0 Debridement Performed for Wound #4 Left,Lateral Foot Assessment: Performed By: Physician Ricard Dillon., MD Debridement Type: Debridement Severity of Tissue Pre Fat layer exposed Debridement: Level of Consciousness (Pre- Awake and Alert procedure): Pre-procedure Verification/Time Out Taken: Yes - 11:02 Start Time: 11:02 Total Area Debrided (L x W): 1 (cm) x 1.1 (cm) = 1.1 (cm) Tissue and other material Viable, Non-Viable, Callus, Slough, Subcutaneous, Slough debrided: Level: Skin/Subcutaneous Tissue Debridement Description: Excisional Instrument: Curette Bleeding:  Minimum Hemostasis Achieved: Pressure End Time: 11:04 Procedural Pain: 0 Post Procedural Pain: 0 Response to Treatment: Procedure was tolerated well Level of Consciousness Awake and Alert (Post-procedure): Post Debridement Measurements of Total Wound Length: (cm) 1 Width: (cm) 1.1 Depth: (cm) 0.1 Volume: (cm) 0.086 Character of Wound/Ulcer Post Improved Debridement: Severity of Tissue Post Debridement: Fat layer exposed Post Procedure Diagnosis Same as Pre-procedure Electronic Signature(s) Signed: 02/18/2020 5:55:43 PM By: Linton Ham MD Signed: 02/21/2020 8:56:01 AM By: [name] RN, BSN Entered By: [name] on 02/18/2020 11:04:05 -------------------------------------------------------------------------------- HPI Details Patient Name: Heather [name]. 02/18/2020 10:30 Date of Service: AM Medical Record [id number] Number: Patient Account Number: [id number] Treating RN: 1971/10/28 (49 y.o. [name] Date of Birth/Sex: Female) Other Clinician: Primary Care Provider: Inda Coke Treating Linton Ham Referring Provider: Provider/Extender: Inda Coke Weeks in Treatment: 0 History of Present Illness HPI Description: 12/10/16; [name] is a 49 year old type II diabetic with polyneuropathy and stage IV chronic renal failure. 2 weeks ago she noted that she had a burn on the right foot. She suffered this having her foot to close to a heater at work although she did not realize this until she got home secondary to neuropathy. She has not been putting anything specific on the wound. We apparently cared for her in 2015 with a wound on her left plantar foot. This required a total contact cast. She had not had any subsequent wound issues. He does not have diabetic foot wear. ABI in this clinic was 1.01 on the right 12/16/16; wound is smaller although the patient still describes some drainage using silver alginate changed to Prisma  today. 12/31/16 wound is smaller although still a difficult debrided here in surface. Switched from [place] to [place] 01/10/17; patient has a new been area on the right lateral malleolus. This was previously darker skin I wonder if she also burned this at some point and we just didn't recognize it. Using Iodoflex to the lateral foot 01/17/17; patient was in the ER on Saturday with increased right foot pain. They did an x-ray of this that showed  no fracture or subluxation. She had soft tissue swelling on the lateral foot as a result of the wound. There was no evidence of bony erosion or destruction to suggest osteomyelitis. They put her on doxycycline 100 twice a day for 7 days. She arrives today with the wound still not in very good condition. Necrotic tissue debrided with a #3 curet. The area on the lateral ankle looks stable 01/27/17 improved today with Santyl. Lateral foot wound is just about closed her original burn injury much better after debridement healthy-looking granulation 02/03/17; lateral foot wound still is open with a nonviable surface. The area over the lateral malleolus also was not closed. There is no evidence of surrounding infection. No evidence of ongoing ischemia 02/10/2017 - patient is here today for evaluation of her right lateral foot and right lateral malleolus ulcerations. She states she's had no issues or concerns over the past week. She states that her blood sugars have consistently been greater than 200, closer to the 250 range. She states that she saw her PCP last week who manages her diabetes, with no change in her current treatment plan. She has been intimately compliant with wearing a surgical shoe, although the surgical shoe may be causing undue friction and/or pressure to her lateral malleolus. 02/17/17; right lateral foot and right lateral malleolus ulcerations. These were initially burn issues. We have been using Hydrofera Blue 2/29/18; the right lateral malleolus  remains closed. Right lateral foot wound is slightly smaller with some depth. Using [facility] 03/03/17 the right lateral malleolus wound remains closed right lateral leg had some necrotic surface material. Using Hydrofera Blue. Change to Silver Collagen 03/10/17; arrives today with complaints of pain on the right lateral foot. Some purulent material noted by her intake nurse. We'll use silver collagen last week 03/17/17; right lateral foot wound has closed. Culture last week staph and strep she is completed doxycycline. The area looks considerably better. READMISSION 02/18/2020 [name] is now a 49 year old woman who we had in the clinic last in 2018 with a burn on her right lateral foot and her right lateral malleolus we eventually close this out. [name] also previously been here in 2015 and 2017. She has type 2 diabetes with peripheral neuropathy. She also has now chronic renal failure on dialysis Tuesday Thursday Saturday. [name] referred here brought by her primary provider for wounds on the mirror image side of her lateral foot left greater than right. She has had a reasonably nice work-up at primary care which is included an ABI done on 02/01/2020 [see below] also an x-ray of the left foot that was normal. She also has an MRI but all she does not currently have an appointment. She tells [name] that she has had the wound on her bilateral feet for 2 months. Noticed by her husband. She has had a round of Augmentin and is currently on doxycycline she has been using bacitracin. Past medical history; type 2 diabetes with peripheral neuropathy and PAD, chronic renal failure on dialysis. She had a CVA fibromyalgia, was treated aggressively for aortitis and is currently following this with vascular in [facility]. 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 ABIs on 02/01/2020 showed an ABI of 0.73 on the right and 0.94 on the left. TBI's were normal  at 0.86 and 0.73. Waveforms on the right were biphasic and on the left triphasic. Electronic Signature(s) Signed: 02/18/2020 5:55:43 PM By: Linton Ham MD Entered By: Linton Ham on 02/18/2020  11:21:25 -------------------------------------------------------------------------------- Physical Exam Details Patient Name: Heather [name], Heather [name] 02/18/2020 10:30 Date of Service: AM Medical Record [id number] Number: Patient Account Number: [id number] Treating RN: January 08, 1971 (49 y.o. [name] Date of Birth/Sex: Female) Other Clinician: Primary Care Provider: Inda Coke Treating Linton Ham Referring Provider: Provider/Extender: Inda Coke Weeks in Treatment: 0 Constitutional Patient is hypertensive.. Pulse regular and within target range for patient.[name] Respirations regular, non-labored and within target range.. Temperature is normal and within the target range for the patient.[name] Appears in no distress. Respiratory work of breathing is normal. Cardiovascular Needle pulses are normal bilaterally.. Neurological Diabetic insensate neuropathy to the monofilament and vibration test.. Psychiatric appears at normal baseline. Notes Wound exam; the patient has an open wound on the left lateral foot at roughly the base of the fifth metatarsal. Callus and thick edges of tissue around the wound removed with a #5 curette and some debris from the surface. Hemostasis with direct pressure On the mirror image side on the right there was apparently a wound there at 1 point she does not have an open area now. There is callus in this area which I gently looked at with a #5 curette I could not identify any open area. No clear evidence of infection in either wound area Electronic Signature(s) Signed: 02/18/2020 5:55:43 PM By: Linton Ham MD Entered By: Linton Ham on 02/18/2020  11:22:56 -------------------------------------------------------------------------------- Physician Orders Details Patient Name: TAMEYA, KUZNIA 02/18/2020 10:30 Date of Service: AM Medical Record [id number] Number: Patient Account Number: [id number] Treating RN: [birth year] (49 y.o. [name] Date of Birth/Sex: Female) Other Clinician: Primary Care Provider: Inda Coke Treating Linton Ham Referring Provider: Provider/Extender: Inda Coke Weeks in Treatment: 0 Verbal / Phone Orders: No Diagnosis Coding Follow-up Appointments Return Appointment in 1 week. Dressing Change Frequency Wound #4 Left,Lateral Foot Change Dressing every other day. Wound Cleansing Wound #4 Left,Lateral Foot May shower and wash wound with soap and water. - on days that dressing is changed Primary Wound Dressing Wound #4 Left,Lateral Foot Calcium Alginate with Silver Secondary Dressing Wound #4 Left,Lateral Foot Kerlix/Rolled Gauze - secure with tape Dry Gauze Off-Loading Open toe surgical shoe to: - both feet Electronic Signature(s) Signed: 02/18/2020 5:55:43 PM By: Linton Ham MD Signed: 02/21/2020 8:56:01 AM By: [name] RN, BSN Entered By: [name] on 02/18/2020 11:08:10 -------------------------------------------------------------------------------- Problem List Details Patient Name: Heather [name], Heather [name] 02/18/2020 10:30 Date of Service: AM Medical Record [id number] Number: Patient Account Number: [id number] Treating RN: Apr 14, 1971 (49 y.o. 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 02/18/2020 10:30 Date of Service: AM Medical Record [id number] Number: Patient Account Number: [id number] Treating RN: 01-20-1971 (49 y.o. [name] Date of Birth/Sex: Female) Other Clinician: Primary Care Provider: Inda Coke Treating Linton Ham Referring Provider: Provider/Extender: Inda Coke Weeks in Treatment: 0 Subjective Chief Complaint Information obtained from Patient patient is here for follow-up evaluation of ulcerations to the right lateral foot and right lateral malleolus 02/18/2020; patient  is here for review of bilateral foot ulcers History of Present Illness (HPI) 12/10/16; [name] is a 49 year old type II diabetic with polyneuropathy and stage IV chronic renal failure. 2 weeks ago she noted that she had a burn on the right foot. She suffered this having her foot to close to a heater at work although she did not realize this until she got home secondary to neuropathy. She has not been putting anything specific on the wound. We apparently cared for her in 2015 with a wound on her left plantar foot. This required a total contact cast. She had not had any subsequent wound issues. He does not have diabetic foot wear. ABI in this clinic was 1.01 on the right 12/16/16; wound is smaller although the patient still describes some drainage using silver alginate changed to Prisma today. 12/31/16 wound is smaller although still a difficult debrided here in surface. Switched from [place] to [place] 01/10/17; patient has a new  been area on the right lateral malleolus. This was previously darker skin I wonder if she also burned this at some point and we just didn't recognize it. Using Iodoflex to the lateral foot 01/17/17; patient was in the ER on Saturday with increased right foot pain. They did an x-ray of this that showed no fracture or subluxation. She had soft tissue swelling on the lateral foot as a result of the wound. There was no evidence of bony erosion or destruction to suggest osteomyelitis. They put her on doxycycline 100 twice a day for 7 days. She arrives today with the wound still not in very good condition. Necrotic tissue debrided with a #3 curet. The area on the lateral ankle looks stable 01/27/17 improved today with Santyl. Lateral foot wound is just about closed her original burn injury much better after debridement healthy-looking granulation 02/03/17; lateral foot wound still is open with a nonviable surface. The area over the lateral malleolus also was not closed. There is no evidence of surrounding infection. No evidence of ongoing ischemia 02/10/2017 - patient is here today for evaluation of her right lateral foot and right lateral malleolus ulcerations. She states she's had no issues or concerns over the past week. She states that her blood sugars have consistently been greater than 200, closer to the 250 range. She states that she saw her PCP last week who manages her diabetes, with no change in her current treatment plan. She has been intimately compliant with wearing a surgical shoe, although the surgical shoe may be causing undue friction and/or pressure to her lateral malleolus. 02/17/17; right lateral foot and right lateral malleolus ulcerations. These were initially burn issues. We have been using Hydrofera Blue 2/29/18; the right lateral malleolus remains closed. Right lateral foot wound is slightly smaller with some depth. Using [facility] 03/03/17 the right lateral malleolus wound  remains closed right lateral leg had some necrotic surface material. Using Hydrofera Blue. Change to Silver Collagen 03/10/17; arrives today with complaints of pain on the right lateral foot. Some purulent material noted by her intake nurse. We'll use silver collagen last week 03/17/17; right lateral foot wound has closed. Culture last week staph and strep she is completed doxycycline. The area looks considerably better. READMISSION 02/18/2020 [name] is now a 49 year old woman who we had in the clinic last in 2018 with a burn on her right lateral foot and her right lateral malleolus we eventually close this out. [name] also previously been here in 2015 and 2017. She has type  2 diabetes with peripheral neuropathy. She also has now chronic renal failure on dialysis Tuesday Thursday Saturday. [name] referred here brought by her primary provider for wounds on the mirror image side of her lateral foot left greater than right. She has had a reasonably nice work-up at primary care which is included an ABI done on 02/01/2020 [see below] also an x-ray of the left foot that was normal. She also has an MRI but all she does not currently have an appointment. She tells [name] that she has had the wound on her bilateral feet for 2 months. Noticed by her husband. She has had a round of Augmentin and is currently on doxycycline she has been using bacitracin. Past medical history; type 2 diabetes with peripheral neuropathy and PAD, chronic renal failure on dialysis. She had a CVA fibromyalgia, was treated aggressively for aortitis and is currently following this with vascular in [facility]. 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 ABIs on 02/01/2020 showed an ABI of 0.73 on the right and 0.94 on the left. TBI's were normal at 0.86 and 0.73. Waveforms on the right were biphasic and on the left triphasic. Patient History Information obtained from  Patient. Allergies ibuprofen, Dilaudid, tramadol Family History Diabetes - Mother,Siblings, Heart Disease - Father,Child, Hypertension - Father, Kidney Disease - Siblings, Stroke - Paternal Grandparents, No family history of Lung Disease, Seizures, Thyroid Problems, Tuberculosis. Social History Never smoker, Marital Status - Married, Alcohol Use - Never, Drug Use - No History, Caffeine Use - Moderate. Medical History Eyes Denies history of Cataracts, Glaucoma, Optic Neuritis Ear/Nose/Mouth/Throat Denies history of Chronic sinus problems/congestion, Middle ear problems Hematologic/Lymphatic Patient has history of Anemia Denies history of Hemophilia, Human Immunodeficiency Virus, Lymphedema, Sickle Cell Disease Respiratory Denies history of Aspiration, Asthma, Chronic Obstructive Pulmonary Disease (COPD), Pneumothorax, Sleep Apnea, Tuberculosis Cardiovascular Patient has history of Hypertension Denies history of Angina, Arrhythmia, Congestive Heart Failure, Coronary Artery Disease, Deep Vein Thrombosis, Hypotension, Myocardial Infarction, Peripheral Arterial Disease, Peripheral Venous Disease, Phlebitis, Vasculitis Gastrointestinal Denies history of Cirrhosis , Colitis, Crohnoos, Hepatitis A, Hepatitis B, Hepatitis C Endocrine Patient has history of Type II Diabetes Genitourinary Denies history of End Stage Renal Disease Immunological Denies history of Lupus Erythematosus, Raynaudoos, Scleroderma Integumentary (Skin) Denies history of History of Burn Musculoskeletal Denies history of Gout, Rheumatoid Arthritis, Osteoarthritis, Osteomyelitis Neurologic Patient has history of Neuropathy Denies history of Dementia, Quadriplegia, Paraplegia, Seizure Disorder Oncologic Denies history of Received Chemotherapy, Received Radiation Psychiatric Denies history of Anorexia/bulimia, Confinement Anxiety Hospitalization/Surgery History - Bascilic Vein Transposition. - Eye Surgery. -  Fibroid Tumor Removal. - Foot Surgery. Medical And Surgical History Notes Constitutional Symptoms (General Health) Obesity Eyes Diabetic Retinopathy Hematologic/Lymphatic B12 Deficiency Respiratory Community acquired Pneumonia Upper Airway Cough Syndrome Cardiovascular Murmur Gastrointestinal Epigastric Pain, IBS Endocrine Hyperthyroidism Genitourinary Leiomyoma of Uterus Chronic Kidney Disease Stage IV Musculoskeletal Bilat pronation deformity of feet Right Sided Lower Back Pain Review of Systems (ROS) Constitutional Symptoms (General Health) Denies complaints or symptoms of Fatigue, Fever, Chills, Marked Weight Change. Eyes Denies complaints or symptoms of Dry Eyes, Vision Changes, Glasses / Contacts. Ear/Nose/Mouth/Throat Denies complaints or symptoms of Chronic sinus problems or rhinitis. Respiratory Denies complaints or symptoms of Chronic or frequent coughs, Shortness of Breath. Cardiovascular Denies complaints or symptoms of Chest pain. Gastrointestinal Denies complaints or symptoms of Frequent diarrhea, Nausea, Vomiting. Endocrine Denies complaints or symptoms of Heat/cold intolerance. Genitourinary Denies complaints or symptoms of Frequent urination. Integumentary (Skin) Complains or has symptoms of Wounds.  Musculoskeletal Denies complaints or symptoms of Muscle Pain, Muscle Weakness. Neurologic Denies complaints or symptoms of Numbness/parasthesias. Psychiatric Denies complaints or symptoms of Claustrophobia, Suicidal. Objective Constitutional Patient is hypertensive.. Pulse regular and within target range for patient.[name] Respirations regular, non-labored and within target range.. Temperature is normal and within the target range for the patient.[name] Appears in no distress. Vitals Time Taken: 10:25 AM, Height: 69 in, Source: Stated, Weight: 225 lbs, Source: Stated, BMI: 33.2, Temperature: 98.3 F, Pulse: 85 bpm, Respiratory Rate: 18 breaths/min, Blood  Pressure: 166/80 mmHg, Capillary Blood Glucose: 147 mg/dl. General Notes: CBG per patient Respiratory work of breathing is normal. Cardiovascular Needle pulses are normal bilaterally.. Neurological Diabetic insensate neuropathy to the monofilament and vibration test.. Psychiatric appears at normal baseline. General Notes: Wound exam; the patient has an open wound on the left lateral foot at roughly the base of the fifth metatarsal. Callus and thick edges of tissue around the wound removed with a #5 curette and some debris from the surface. Hemostasis with direct pressure ooOn the mirror image side on the right there was apparently a wound there at 1 point she does not have an open area now. There is callus in this area which I gently looked at with a #5 curette I could not identify any open area. ooNo clear evidence of infection in either wound area Integumentary (Hair, Skin) Wound #4 status is Open. Original cause of wound was Gradually Appeared. The wound is located on the Left,Lateral Foot. The wound measures 1cm length x 1.1cm width x 0.1cm depth; 0.864cm^2 area and 0.086cm^3 volume. There is Fat Layer (Subcutaneous Tissue) Exposed exposed. There is no tunneling or undermining noted. There is a medium amount of serosanguineous drainage noted. The wound margin is flat and intact. There is small (1-33%) pink granulation within the wound bed. There is a large (67-100%) amount of necrotic tissue within the wound bed including Adherent Slough. Assessment Active Problems ICD-10 Type 2 diabetes mellitus with foot ulcer Non-pressure chronic ulcer of other part of left foot with fat layer exposed Type 2 diabetes mellitus with diabetic polyneuropathy Procedures Wound #4 Pre-procedure diagnosis of Wound #4 is a Diabetic Wound/Ulcer of the Lower Extremity located on the Left,Lateral Foot .Severity of Tissue Pre Debridement is: Fat layer exposed. There was a Excisional Skin/Subcutaneous  Tissue Debridement with a total area of 1.1 sq cm performed by Ricard Dillon., MD. With the following instrument(s): Curette to remove Viable and Non-Viable tissue/material. Material removed includes Callus, Subcutaneous Tissue, and Slough. No specimens were taken. A time out was conducted at 11:02, prior to the start of the procedure. A Minimum amount of bleeding was controlled with Pressure. The procedure was tolerated well with a pain level of 0 throughout and a pain level of 0 following the procedure. Post Debridement Measurements: 1cm length x 1.1cm width x 0.1cm depth; 0.086cm^3 volume. Character of Wound/Ulcer Post Debridement is improved. Severity of Tissue Post Debridement is: Fat layer exposed. Post procedure Diagnosis Wound #4: Same as Pre-Procedure Plan Follow-up Appointments: Return Appointment in 1 week. Dressing Change Frequency: Wound #4 Left,Lateral Foot: Change Dressing every other day. Wound Cleansing: Wound #4 Left,Lateral Foot: May shower and wash wound with soap and water. - on days that dressing is changed Primary Wound Dressing: Wound #4 Left,Lateral Foot: Calcium Alginate with Silver Secondary Dressing: Wound #4 Left,Lateral Foot: Kerlix/Rolled Gauze - secure with tape Dry Gauze Off-Loading: Open toe surgical shoe to: - both feet 1. We will use silver alginate on the wound  on the left lateral foot with a border foam 2. I have given her bilateral healing sandals. This is likely a neuropathic wound with likely inversion at the ankle when she walks. She came in in almost a slipper-like material all that might not help her with keeping her wounds off the floor. 3. She has an exercise bike and a treadmill at home I have asked her to use the bike but not the treadmill until we can get this to close. 4. [name] completing doxycycline I did not culture the wound or alter the antibiotics. 5. She has severe diabetic neuropathy which I think is the culprit here  contributing to balance issues. 6. I do not believe she has a significant arterial issue on either side that should impair healing in these areas. I base this on clinical exam and the previously ordered arterial studies I spent 30 minutes in review of this patient's record including records in epic, information sent by her primary physician, face-to-face evaluation and completion of this record Electronic Signature(s) Signed: 02/18/2020 5:55:43 PM By: Linton Ham MD Entered By: Linton Ham on 02/18/2020 11:25:44 -------------------------------------------------------------------------------- HxROS Details Patient Name: Heather [name]. 02/18/2020 10:30 Date of Service: AM Medical Record [id number] Number: Patient Account Number: [id number] Treating RN: 12-22-1971 (49 y.o. Heather [name] Date of Birth/Sex: Female) Other Clinician: Primary Care Provider: Inda Coke Treating Linton Ham Referring Provider: Provider/Extender: Inda Coke Weeks in Treatment: 0 Information Obtained From Patient Constitutional Symptoms (General Health) Complaints and Symptoms: Negative for: Fatigue; Fever; Chills; Marked Weight Change Medical History: Past Medical History Notes: Obesity Eyes Complaints and Symptoms: Negative for: Dry Eyes; Vision Changes; Glasses / Contacts Medical History: Negative for: Cataracts; Glaucoma; Optic Neuritis Past Medical History Notes: Diabetic Retinopathy Ear/Nose/Mouth/Throat Complaints and Symptoms: Negative for: Chronic sinus problems or rhinitis Medical History: Negative for: Chronic sinus problems/congestion; Middle ear problems Respiratory Complaints and Symptoms: Negative for: Chronic or frequent coughs; Shortness of Breath Medical History: Negative for: Aspiration; Asthma; Chronic Obstructive Pulmonary Disease (COPD); Pneumothorax; Sleep Apnea; Tuberculosis Past Medical History Notes: Community acquired Pneumonia Upper  Airway Cough Syndrome Cardiovascular Complaints and Symptoms: Negative for: Chest pain Medical History: Positive for: Hypertension Negative for: Angina; Arrhythmia; Congestive Heart Failure; Coronary Artery Disease; Deep Vein Thrombosis; Hypotension; Myocardial Infarction; Peripheral Arterial Disease; Peripheral Venous Disease; Phlebitis; Vasculitis Past Medical History Notes: Murmur Gastrointestinal Complaints and Symptoms: Negative for: Frequent diarrhea; Nausea; Vomiting Medical History: Negative for: Cirrhosis ; Colitis; Crohns; Hepatitis A; Hepatitis B; Hepatitis C Past Medical History Notes: Epigastric Pain, IBS Endocrine Complaints and Symptoms: Negative for: Heat/cold intolerance Medical History: Positive for: Type II Diabetes Past Medical History Notes: Hyperthyroidism Time with diabetes: since age 46 Treated with: Insulin Blood sugar tested every day: Yes Tested : Genitourinary Complaints and Symptoms: Negative for: Frequent urination Medical History: Negative for: End Stage Renal Disease Past Medical History Notes: Leiomyoma of Uterus Chronic Kidney Disease Stage IV Integumentary (Skin) Complaints and Symptoms: Positive for: Wounds Medical History: Negative for: History of Burn Musculoskeletal Complaints and Symptoms: Negative for: Muscle Pain; Muscle Weakness Medical History: Negative for: Gout; Rheumatoid Arthritis; Osteoarthritis; Osteomyelitis Past Medical 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 Lower Back Pain Neurologic Complaints and Symptoms: Negative for: Numbness/parasthesias Medical History: Positive for: Neuropathy Negative for: Dementia; Quadriplegia; Paraplegia; Seizure Disorder Psychiatric Complaints and Symptoms: Negative for: Claustrophobia; Suicidal Medical History: Negative for: Anorexia/bulimia; Confinement Anxiety Hematologic/Lymphatic Medical History: Positive for: Anemia Negative for: Hemophilia;  Human Immunodeficiency Virus; Lymphedema; Sickle Cell Disease Past Medical History Notes:  B12 Deficiency Immunological Medical History: Negative for: Lupus Erythematosus; Raynauds; Scleroderma Oncologic Medical History: Negative for: Received Chemotherapy; Received Radiation Immunizations Pneumococcal Vaccine: Received Pneumococcal Vaccination: No Implantable Devices No devices added Hospitalization / Surgery History Type of Hospitalization/Surgery Bascilic Vein Transposition Eye Surgery Fibroid Tumor Removal Foot Surgery Family and Social History Diabetes: Yes - Mother,Siblings; Heart Disease: Yes - Father,Child; Hypertension: Yes - Father; Kidney Disease: Yes - Siblings; Lung Disease: No; Seizures: No; Stroke: Yes - Paternal Grandparents; Thyroid Problems: No; Tuberculosis: No; Never smoker; Marital Status - Married; Alcohol Use: Never; Drug Use: No History; Caffeine Use: Moderate; Financial Concerns: No; Food, Clothing or Shelter Needs: No; Transportation Concerns: No Engineer, maintenance) Signed: 02/18/2020 5:55:43 PM By: Linton Ham MD Signed: 02/19/2020 6:06:02 PM By: Heather Coria RN Entered By: Heather [name] on 02/18/2020 10:29:11 -------------------------------------------------------------------------------- SuperBill Details Patient Name: Date of Service: Heather [name], Heather [name] 02/18/2020 Medical Record [id number] Patient Account Number: [id number] Date of Birth/Sex: Treating RN: 1971-04-02 (49 y.o. Female) [name] Primary Care Provider: Inda Coke Other Clinician: Referring Provider: Treating Provider/Extender:Dixie Jafri, Annice Needy, Calton Golds in Treatment: 0 Diagnosis Coding ICD-10 Codes Code Description E11.621 Type 2 diabetes mellitus with foot ulcer L97.522 Non-pressure chronic ulcer of other part of left foot with fat layer exposed E11.42 Type 2 diabetes mellitus with diabetic polyneuropathy Facility Procedures CPT4 Code  Description: 28366294 99213 - WOUND CARE VISIT-LEV 3 EST PT Modifier: 25 Quantity: 1 CPT4 Code Description: [id number] 11042 - DEB SUBQ TISSUE 20 SQ CM/< ICD-10 Diagnosis Description L97.522 Non-pressure chronic ulcer of other part of left foot with fa Modifier: t layer expose Quantity: 1 d Physician Procedures CPT4 Code Description: 5465681 27517 - WC PHYS LEVEL 4 - EST PT ICD-10 Diagnosis Description E11.621 Type 2 diabetes mellitus with foot ulcer L97.522 Non-pressure chronic ulcer of other part of left foot wi E11.42 Type 2 diabetes mellitus with diabetic  polyneuropathy Modifier: 25 th fat layer ex Quantity: 1 posed CPT4 Code Description: 0017494 49675 - WC PHYS SUBQ TISS 20 SQ CM ICD-10 Diagnosis Description L97.522 Non-pressure chronic ulcer of other part of left foot with f Modifier: at layer expose Quantity: 1 d Electronic Signature(s) Signed: 02/18/2020 5:55:43 PM By: Linton Ham MD Signed: 02/21/2020 8:56:01 AM By: [name] RN, BSN Entered By: [name] on 02/18/2020 13:49:48

## 2020-02-21 NOTE — Progress Notes (Signed)
TANIAYA, RUDDER ([id number]) Visit Report for 02/18/2020 Allergy List Details Patient Name: Date of Service: CHUDNEY, SCHEFFLER 02/18/2020 10:30 AM Medical Record [id number] Patient Account Number: [id number] Date of Birth/Sex: Treating RN: September 28, 1971 (49 y.o. Female) Carlene Coria Primary Care Phung Kotas: Inda Coke Other Clinician: Referring Adryan Shin: Treating Amylah Will/Extender:Robson, Annice Needy, Calton Golds in Treatment: 0 Allergies Active Allergies ibuprofen Dilaudid tramadol Allergy Notes Electronic Signature(s) Signed: 02/19/2020 6:06:02 PM By: Carlene Coria RN Entered By: Carlene Coria on 02/18/2020 10:26:23 -------------------------------------------------------------------------------- Arrival Information Details Patient Name: Date of Service: [name] 02/18/2020 10:30 AM Medical Record [id number] Patient Account Number: [id number] Date of Birth/Sex: Treating RN: September 01, 1971 (49 y.o. Female) Carlene Coria Primary Care Deonne Rooks: Inda Coke Other Clinician: Referring Gazelle Towe: Treating Avagail Whittlesey/Extender:Robson, Annice Needy, Calton Golds in Treatment: 0 Visit Information Patient Arrived: Walker Arrival Time: 10:18 Accompanied By: self Transfer Assistance: None Patient Identification Verified: Yes Secondary Verification Process Yes Completed: Patient Has Alerts: Yes Patient Alerts: Patient on Blood Patient Alerts: Patient on Blood Thinner ABI left .94 ABI right .73 History Since Last Visit All ordered tests and consults were completed: No Added or deleted any medications: No Any new allergies or adverse reactions: No Had a fall or experienced change in activities of daily living that may affect risk of falls: No Signs or symptoms of abuse/neglect since last visito No Hospitalized since last visit: No Implantable device outside of the clinic excluding cellular tissue based products placed in the center  since last visit: No Electronic Signature(s) Signed: 02/19/2020 6:06:02 PM By: Carlene Coria RN Entered By: Carlene Coria on 02/18/2020 10:35:44 -------------------------------------------------------------------------------- Clinic Level of Care Assessment Details Patient Name: Date of Service: KIMIYA, BRUNELLE 02/18/2020 10:30 AM Medical Record [id number] Patient Account Number: [id number] Date of Birth/Sex: Treating RN: October 21, 1971 (49 y.o. Female) [name] Primary Care Kwamaine Cuppett: Inda Coke Other Clinician: Referring Mahira Petras: Treating Nyomie Ehrlich/Extender:Robson, Annice Needy, Calton Golds in Treatment: 0 Clinic Level of Care Assessment Items TOOL 1 Quantity Score X - Use when EandM and Procedure is performed on INITIAL visit 1 0 ASSESSMENTS - Nursing Assessment / Reassessment X - General Physical Exam (combine w/ comprehensive assessment (listed just below) 1 20 when performed on new pt. evals) X - Comprehensive Assessment (HX, ROS, Risk Assessments, Wounds Hx, etc.) 1 25 ASSESSMENTS - Wound and Skin Assessment / Reassessment []  - Dermatologic / Skin Assessment (not related to wound area) 0 ASSESSMENTS - Ostomy and/or Continence Assessment and Care []  - Incontinence Assessment and Management 0 []  - Ostomy Care Assessment and Management (repouching, etc.) 0 PROCESS - Coordination of Care X - Simple Patient / Family Education for ongoing care 1 15 []  - Complex (extensive) Patient / Family Education for ongoing care 0 X - Staff obtains Programmer, systems, Records, Test Results / Process Orders 1 10 []  - Staff telephones HHA, Nursing Homes / Clarify orders / etc 0 []  - Routine Transfer to another Facility (non-emergent condition) 0 []  - Routine Hospital Admission (non-emergent condition) 0 X - New Admissions / Biomedical engineer / Ordering NPWT, Apligraf, etc. 1 15 []  - Emergency Hospital Admission (emergent condition) 0 PROCESS - Special Needs []  - Pediatric  / Minor Patient Management 0 []  - Isolation Patient Management 0 []  - Hearing / Language / Visual special needs 0 []  - Assessment of Community assistance (transportation, D/C planning, etc.) 0 []  - Additional assistance / Altered mentation 0 []  - Support Surface(s) Assessment (bed, cushion, seat, etc.) 0 INTERVENTIONS - Miscellaneous []  - External ear exam 0 []  - Patient Transfer (  multiple staff / Harrel Lemon Lift / Similar devices) 0 []  - Simple Staple / Suture removal (25 or less) 0 []  - Complex Staple / Suture removal (26 or more) 0 []  - Hypo/Hyperglycemic Management (do not check if billed separately) 0 []  - Ankle / Brachial Index (ABI) - do not check if billed separately 0 Has the patient been seen at the hospital within the last three years: Yes Total Score: 85 Level Of Care: New/Established - Level 3 Electronic Signature(s) Signed: 02/21/2020 8:56:01 AM By: [name] RN, BSN Entered By: [name] on 02/18/2020 11:06:38 -------------------------------------------------------------------------------- Encounter Discharge Information Details Patient Name: Date of Service: [name] 02/18/2020 10:30 AM Medical Record [id number] Patient Account Number: [id number] Date of Birth/Sex: Treating RN: 08-13-1971 (49 y.o. Female) Deon Pilling Primary Care Kaden Dunkel: Inda Coke Other Clinician: Referring Jaline Pincock: Treating Leeland Lovelady/Extender:Robson, Annice Needy, Calton Golds in Treatment: 0 Encounter Discharge Information Items Post Procedure Vitals Discharge Condition: Stable Temperature (F): 98.3 Ambulatory Status: Walker Pulse (bpm): 85 Discharge Destination: Home Respiratory Rate (breaths/min): 18 Transportation: Private Auto Blood Pressure (mmHg): 166/80 Accompanied By: self Schedule Follow-up Appointment: Yes Clinical Summary of Care: Electronic Signature(s) Signed: 02/18/2020 5:30:41 PM By: Deon Pilling Entered By: Deon Pilling on  02/18/2020 11:30:31 -------------------------------------------------------------------------------- Lower Extremity Assessment Details Patient Name: Date of Service: ADELE, MILSON 02/18/2020 10:30 AM Medical Record [id number] Patient Account Number: [id number] Date of Birth/Sex: Treating RN: 12-30-1970 (50 y.o. Female) Carlene Coria Primary Care Thersa Mohiuddin: Inda Coke Other Clinician: Referring Alucard Fearnow: Treating Lissete Maestas/Extender:Robson, Annice Needy, Calton Golds in Treatment: 0 Edema Assessment Assessed: [Left: No] [Right: No] Edema: [Left: Ye] [Right: s] Calf Left: Right: Point of Measurement: 40 cm From Medial Instep 37 cm cm Ankle Left: Right: Point of Measurement: 10 cm From Medial Instep 21 cm cm Electronic Signature(s) Signed: 02/19/2020 6:06:02 PM By: Carlene Coria RN Entered By: Carlene Coria on 02/18/2020 10:35:07 -------------------------------------------------------------------------------- Multi Wound Chart Details Patient Name: Date of Service: [name]. 02/18/2020 10:30 AM Medical Record [id number] Patient Account Number: [id number] Date of Birth/Sex: Treating RN: 02/28/1971 (49 y.o. Female) [name] Primary Care Maleigh Bagot: Inda Coke Other Clinician: Referring Cillian Gwinner: Treating Abiageal Blowe/Extender:Robson, Annice Needy, Calton Golds in Treatment: 0 Vital Signs Height(in): 69 Capillary Blood 147 Glucose(mg/dl): Weight(lbs): 225 Pulse(bpm): 18 Body Mass Index(BMI): 33 Blood Pressure(mmHg): 166/80 Temperature(F): 98.3 Respiratory 18 Rate(breaths/min): Photos: [4:No Photos] [N/A:N/A] Wound Location: [4:Left Foot - Lateral] [N/A:N/A] Wounding Event: [4:Gradually Appeared] [N/A:N/A] Primary Etiology: [4:Diabetic Wound/Ulcer of the N/A Lower Extremity] Comorbid History: [4:Anemia, Hypertension, TypeN/A II Diabetes, Neuropathy] Date Acquired: [4:12/28/2019] [N/A:N/A] Weeks of Treatment: [4:0]  [N/A:N/A] Wound Status: [4:Open] [N/A:N/A] Measurements L x W x D 1x1.1x0.1 [N/A:N/A] (cm) Area (cm) : [4:0.864] [N/A:N/A] Volume (cm) : [4:0.086] [N/A:N/A] % Reduction in Area: [4:0.00%] [N/A:N/A] % Reduction in Volume: 0.00% [N/A:N/A] Classification: [4:Grade 2] [N/A:N/A] Exudate Amount: [4:Medium] [N/A:N/A] Exudate Type: [4:Serosanguineous] [N/A:N/A] Exudate Color: [4:red, brown] [N/A:N/A] Wound Margin: [4:Flat and Intact] [N/A:N/A] Granulation Amount: [4:Small (1-33%)] [N/A:N/A] Granulation Quality: [4:Pink] [N/A:N/A] Necrotic Amount: [4:Large (67-100%)] [N/A:N/A] Exposed Structures: [4:Fat Layer (Subcutaneous N/A Tissue) Exposed: Yes Fascia: No Tendon: No Muscle: No Joint: No Bone: No] Epithelialization: [4:None] [N/A:N/A] Debridement: [4:Debridement - Excisional N/A] Pre-procedure [4:11:02] [N/A:N/A] Verification/Time Out Taken: Tissue Debrided: [4:Callus, Subcutaneous, Slough] [N/A:N/A] Level: [4:Skin/Subcutaneous Tissue N/A] Debridement Area (sq cm):1.1 [N/A:N/A] Instrument: [4:Curette] [N/A:N/A] Bleeding: [4:Minimum] [N/A:N/A] Hemostasis Achieved: [4:Pressure] [N/A:N/A] Procedural Pain: [4:0] [N/A:N/A] Post Procedural Pain: [4:0] [N/A:N/A] Debridement Treatment Procedure was tolerated [N/A:N/A] Response: [4:well] Post Debridement [4:1x1.1x0.1] [N/A:N/A] Measurements L x W x D (cm) Post Debridement [  4:0.086] [N/A:N/A] Volume: (cm) Procedures Performed: Debridement [N/A:N/A] Electronic Signature(s) Signed: 02/18/2020 5:55:43 PM By: Linton Ham MD Signed: 02/21/2020 8:56:01 AM By: [name] RN, BSN Entered By: Linton Ham on 02/18/2020 11:16:42 -------------------------------------------------------------------------------- Multi-Disciplinary Care Plan Details Patient Name: Date of Service: RUBYLEE, ZAMARRIPA 02/18/2020 10:30 AM Medical Record [id number] Patient Account Number: [id number] Date of Birth/Sex: Treating RN: 1971-10-21 (49  y.o. Female) [name] Primary Care Rasmus Preusser: Inda Coke Other Clinician: Referring Avynn Klassen: Treating Jasim Harari/Extender:Robson, Annice Needy, Calton Golds in Treatment: 0 Active Inactive Nutrition Nursing Diagnoses: Impaired glucose control: actual or potential Potential for alteratiion in Nutrition/Potential for imbalanced nutrition Goals: Patient/caregiver agrees to and verbalizes understanding of need to use nutritional supplements and/or vitamins as prescribed Date Initiated: 02/18/2020 Target Resolution Date: 03/21/2020 Goal Status: Active Patient/caregiver will maintain therapeutic glucose control Date Initiated: 02/18/2020 Target Resolution Date: 03/21/2020 Goal Status: Active Interventions: Assess HgA1c results as ordered upon admission and as needed Assess patient nutrition upon admission and as needed per policy Provide education on elevated blood sugars and impact on wound healing Provide education on nutrition Notes: Wound/Skin Impairment Nursing Diagnoses: Impaired tissue integrity Knowledge deficit related to ulceration/compromised skin integrity Goals: Patient/caregiver will verbalize understanding of skin care regimen Date Initiated: 02/18/2020 Target Resolution Date: 03/21/2020 Goal Status: Active Ulcer/skin breakdown will have a volume reduction of 30% by week 4 Date Initiated: 02/18/2020 Target Resolution Date: 03/21/2020 Goal Status: Active Interventions: Assess patient/caregiver ability to obtain necessary supplies Assess patient/caregiver ability to perform ulcer/skin care regimen upon admission and as needed Assess ulceration(s) every visit Provide education on ulcer and skin care Notes: Electronic Signature(s) Signed: 02/21/2020 8:56:01 AM By: [name] RN, BSN Entered By: [name] on 02/18/2020 11:01:18 -------------------------------------------------------------------------------- Pain Assessment Details Patient  Name: Date of Service: [name] 02/18/2020 10:30 AM Medical Record [id number] Patient Account Number: [id number] Date of Birth/Sex: Treating RN: Jun 06, 1971 (49 y.o. Female) Carlene Coria Primary Care Waymond Meador: Inda Coke Other Clinician: Referring Creek Gan: Treating Shyler Holzman/Extender:Robson, Annice Needy, Calton Golds in Treatment: 0 Active Problems Location of Pain Severity and Description of Pain Patient Has Paino No Site Locations Pain Management and Medication Current Pain Management: Electronic Signature(s) Signed: 02/19/2020 6:06:02 PM By: Carlene Coria RN Entered By: Carlene Coria on 02/18/2020 10:42:53 -------------------------------------------------------------------------------- Patient/Caregiver Education Details Patient Name: [name]. Date of Service: 2/22/2021andnbsp10:30 AM Medical Record [id number] Number: Patient Account Number: [id number] Treating RN: Date of Birth/Gender: July 23, 1971 (49 y.o. [name] Female) Other Clinician: Primary Care Treating Alease Frame Physician: Physician/Extender: Referring Physician: Gaspar Garbe in Treatment: 0 Education Assessment Education Provided To: Patient Education Topics Provided Elevated Blood Sugar/ Impact on Healing: Methods: Explain/Verbal Responses: State content correctly Nutrition: Methods: Explain/Verbal Responses: State content correctly Wound/Skin Impairment: Methods: Explain/Verbal Responses: State content correctly Electronic Signature(s) Signed: 02/21/2020 8:56:01 AM By: [name] RN, BSN Entered By: [name] on 02/18/2020 11:01:35 -------------------------------------------------------------------------------- Wound Assessment Details Patient Name: Date of Service: [name]. 02/18/2020 10:30 AM Medical Record [id number] Patient Account Number: [id number] Date of Birth/Sex: Treating  RN: [birth year] (49 y.o. Female) [name] Primary Care Xachary Hambly: Inda Coke Other Clinician: Referring Lakeena Downie: Treating Tequita Marrs/Extender:Robson, Annice Needy, Calton Golds in Treatment: 0 Wound Status Wound Number: 4 Primary Diabetic Wound/Ulcer of the Lower Etiology: Extremity Wound Location: Left Foot - Lateral Wound Open Wounding Event: Gradually Appeared Status: Date Acquired: 12/28/2019 Comorbid Anemia, Hypertension, Type II Diabetes, Weeks Of Treatment: 0 History: Neuropathy Clustered Wound: No Photos Wound Measurements Length: (cm) 1 % Reduct Width: (cm) 1.1 % Reduct Depth: (  cm) 0.1 Epitheli Area: (cm) 0.864 Tunneli Volume: (cm) 0.086 Undermi Wound Description Classification: Grade 2 Wound Margin: Flat and Intact Exudate Amount: Medium Exudate Type: Serosanguineous Exudate Color: red, brown Wound Bed Granulation Amount: Small (1-33%) Granulation Quality: Pink Necrotic Amount: Large (67-100%) Necrotic Quality: Adherent Slough Foul Odor After Cleansing: No Slough/Fibrino Yes Exposed Structure Fascia Exposed: No Fat Layer (Subcutaneous Tissue) Exposed: Yes Tendon Exposed: No Muscle Exposed: No Joint Exposed: No Bone Exposed: No ion in Area: 0% ion in Volume: 0% alization: None ng: No ning: No Treatment Notes Wound #4 (Left, Lateral Foot) 1. Cleanse With Wound Cleanser 3. Primary Dressing Applied Calcium Alginate Ag 4. Secondary Dressing Dry Gauze Roll Gauze 5. Secured With Medipore tape 7. Footwear/Offloading device applied Surgical shoe Notes bilateral feet surgical shoes. explained how to apply dressings, frequency of change, DME company, and when to return to wound center. Electronic Signature(s) Signed: 02/18/2020 4:33:06 PM By: Mikeal Hawthorne EMT/HBOT Signed: 02/21/2020 8:56:01 AM By: [name] RN, BSN Entered By: Mikeal Hawthorne on 02/18/2020  13:47:33 -------------------------------------------------------------------------------- [place] Details Patient Name: Date of Service: [name]. 02/18/2020 10:30 AM Medical Record [id number] Patient Account Number: [id number] Date of Birth/Sex: Treating RN: 1971-07-18 (49 y.o. Female) Carlene Coria Primary Care Qadir Folks: Inda Coke Other Clinician: Referring Shaundra Fullam: Treating Deniss Wormley/Extender:Robson, Annice Needy, Calton Golds in Treatment: 0 Vital Signs Time Taken: 10:25 Temperature (F): 98.3 Height (in): 69 Pulse (bpm): 85 Source: Stated Respiratory Rate (breaths/min): 18 Weight (lbs): 225 Blood Pressure (mmHg): 166/80 Source: Stated Capillary Blood Glucose (mg/dl): 147 Body Mass Index (BMI): 33.2 Reference Range: 80 - 120 mg / dl Notes CBG per patient Electronic Signature(s) Signed: 02/19/2020 6:06:02 PM By: Carlene Coria RN Entered By: Carlene Coria on 02/18/2020 10:25:57

## 2020-02-22 ENCOUNTER — Ambulatory Visit (HOSPITAL_COMMUNITY)
Admission: RE | Admit: 2020-02-22 | Payer: Managed Care, Other (non HMO) | Source: Ambulatory Visit | Attending: Physician Assistant | Admitting: Physician Assistant

## 2020-02-25 ENCOUNTER — Encounter (HOSPITAL_BASED_OUTPATIENT_CLINIC_OR_DEPARTMENT_OTHER): Payer: Managed Care, Other (non HMO) | Attending: Internal Medicine | Admitting: Internal Medicine

## 2020-02-25 ENCOUNTER — Other Ambulatory Visit: Payer: Self-pay

## 2020-02-25 DIAGNOSIS — Z992 Dependence on renal dialysis: Secondary | ICD-10-CM | POA: Insufficient documentation

## 2020-02-25 DIAGNOSIS — L97522 Non-pressure chronic ulcer of other part of left foot with fat layer exposed: Secondary | ICD-10-CM | POA: Insufficient documentation

## 2020-02-25 DIAGNOSIS — E1122 Type 2 diabetes mellitus with diabetic chronic kidney disease: Secondary | ICD-10-CM | POA: Insufficient documentation

## 2020-02-25 DIAGNOSIS — E1142 Type 2 diabetes mellitus with diabetic polyneuropathy: Secondary | ICD-10-CM | POA: Diagnosis not present

## 2020-02-25 DIAGNOSIS — I776 Arteritis, unspecified: Secondary | ICD-10-CM | POA: Insufficient documentation

## 2020-02-25 DIAGNOSIS — I120 Hypertensive chronic kidney disease with stage 5 chronic kidney disease or end stage renal disease: Secondary | ICD-10-CM | POA: Insufficient documentation

## 2020-02-25 DIAGNOSIS — M797 Fibromyalgia: Secondary | ICD-10-CM | POA: Diagnosis not present

## 2020-02-25 DIAGNOSIS — N186 End stage renal disease: Secondary | ICD-10-CM | POA: Insufficient documentation

## 2020-02-25 DIAGNOSIS — E11621 Type 2 diabetes mellitus with foot ulcer: Secondary | ICD-10-CM | POA: Insufficient documentation

## 2020-02-25 NOTE — Progress Notes (Signed)
BERTHE, OLEY ([id number]) Visit Report for 02/25/2020 Debridement Details Patient Name: Heather [name], Heather [name]. Date of Service: 02/25/2020 1:15 PM Medical Record [id number] Patient Account Number: [id number] Date of Birth/Sex: Treating RN: 08/27/1971 (49 y.o. [name] Primary Care Provider: Inda Coke Other Clinician: Referring Provider: Treating Provider/Extender:Talita Recht, Annice Needy, Calton Golds in Treatment: 1 Debridement Performed for Wound #4 Left,Lateral Foot Assessment: Performed By: Physician Ricard Dillon., MD Debridement Type: Debridement Severity of Tissue Pre Fat layer exposed Debridement: Level of Consciousness (Pre- Awake and Alert procedure): Pre-procedure Verification/Time Out Taken: Yes - 14:12 Start Time: 14:12 Pain Control: Lidocaine 5% topical ointment Total Area Debrided (L x W): 0.8 (cm) x 0.9 (cm) = 0.72 (cm) Tissue and other material Viable, Non-Viable, Callus, Subcutaneous, Skin: Epidermis debrided: Level: Skin/Subcutaneous Tissue Debridement Description: Excisional Instrument: Curette Bleeding: Minimum Hemostasis Achieved: Pressure End Time: 14:13 Procedural Pain: 0 Post Procedural Pain: 0 Response to Treatment: Procedure was tolerated well Level of Consciousness Awake and Alert (Post-procedure): Post Debridement Measurements of Total Wound Length: (cm) 0.8 Width: (cm) 0.9 Depth: (cm) 0.1 Volume: (cm) 0.057 Character of Wound/Ulcer Post Improved Debridement: Severity of Tissue Post Debridement: Fat layer exposed Post Procedure Diagnosis Same as Pre-procedure Electronic Signature(s) Signed: 02/25/2020 5:45:55 PM By: Linton Ham MD Signed: 02/25/2020 5:57:14 PM By: [name] RN, BSN Entered By: Linton Ham on 02/25/2020 14:35:47 -------------------------------------------------------------------------------- HPI Details Patient Name: Date of Service: [name] 02/25/2020 1:15  PM Medical Record [id number] Patient Account Number: [id number] Date of Birth/Sex: Treating RN: 08/23/1971 (49 y.o. [name] Primary Care Provider: Inda Coke Other Clinician: Referring Provider: Treating Provider/Extender:Jeffrey Voth, Annice Needy, Calton Golds in Treatment: 1 History of Present Illness HPI Description: 12/10/16; Heather [name] is a 49 year old type II diabetic with polyneuropathy and stage IV chronic renal failure. 2 weeks ago she noted that she had a burn on the right foot. She suffered this having her foot to close to a heater at work although she did not realize this until she got home secondary to neuropathy. She has not been putting anything specific on the wound. We apparently cared for her in 2015 with a wound on her left plantar foot. This required a total contact cast. She had not had any subsequent wound issues. He does not have diabetic foot wear. ABI in this clinic was 1.01 on the right 12/16/16; wound is smaller although the patient still describes some drainage using silver alginate changed to Prisma today. 12/31/16 wound is smaller although still a difficult debrided here in surface. Switched from [place] to [place] 01/10/17; patient has a new been area on the right lateral malleolus. This was previously darker skin I wonder if she also burned this at some point and we just didn't recognize it. Using Iodoflex to the lateral foot 01/17/17; patient was in the ER on Saturday with increased right foot pain. They did an x-ray of this that showed no fracture or subluxation. She had soft tissue swelling on the lateral foot as a result of the wound. There was no evidence of bony erosion or destruction to suggest osteomyelitis. They put her on doxycycline 100 twice a day for 7 days. She arrives today with the wound still not in very good condition. Necrotic tissue debrided with a #3 curet. The area on the lateral ankle looks stable 01/27/17 improved today  with Santyl. Lateral foot wound is just about closed her original burn injury much better after debridement healthy-looking granulation 02/03/17; lateral foot wound still is open with a nonviable  surface. The area over the lateral malleolus also was not closed. There is no evidence of surrounding infection. No evidence of ongoing ischemia 02/10/2017 - patient is here today for evaluation of her right lateral foot and right lateral malleolus ulcerations. She states she's had no issues or concerns over the past week. She states that her blood sugars have consistently been greater than 200, closer to the 250 range. She states that she saw her PCP last week who manages her diabetes, with no change in her current treatment plan. She has been intimately compliant with wearing a surgical shoe, although the surgical shoe may be causing undue friction and/or pressure to her lateral malleolus. 02/17/17; right lateral foot and right lateral malleolus ulcerations. These were initially burn issues. We have been using Hydrofera Blue 2/29/18; the right lateral malleolus remains closed. Right lateral foot wound is slightly smaller with some depth. Using [facility] 03/03/17 the right lateral malleolus wound remains closed right lateral leg had some necrotic surface material. Using Hydrofera Blue. Change to Silver Collagen 03/10/17; arrives today with complaints of pain on the right lateral foot. Some purulent material noted by her intake nurse. We'll use silver collagen last week 03/17/17; right lateral foot wound has closed. Culture last week staph and strep she is completed doxycycline. The area looks considerably better. READMISSION 02/18/2020 Heather [name] is now a 49 year old woman who we had in the clinic last in 2018 with a burn on her right lateral foot and her right lateral malleolus we eventually close this out. [name] also previously been here in 2015 and 2017. She has type 2 diabetes with peripheral  neuropathy. She also has now chronic renal failure on dialysis Tuesday Thursday Saturday. [name] referred here brought by her primary provider for wounds on the mirror image side of her lateral foot left greater than right. She has had a reasonably nice work-up at primary care which is included an ABI done on 02/01/2020 [see below] also an x-ray of the left foot that was normal. She also has an MRI but all she does not currently have an appointment. She tells [name] that she has had the wound on her bilateral feet for 2 months. Noticed by her husband. She has had a round of Augmentin and is currently on doxycycline she has been using bacitracin. Past medical history; type 2 diabetes with peripheral neuropathy and PAD, chronic renal failure on dialysis. She had a CVA fibromyalgia, was treated aggressively for aortitis and is currently following this with vascular in [facility]. 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 ABIs on 02/01/2020 showed an ABI of 0.73 on the right and 0.94 on the left. TBI's were normal at 0.86 and 0.73. Waveforms on the right were biphasic and on the left triphasic. 3/1; the area on the right lateral foot seems to have closed over. Left foot smaller by about 2 mm however nonviable tissue around the wound. She says she has been using her surgical shoes although she came in here with slipper- like booties Electronic Signature(s) Signed: 02/25/2020 5:45:55 PM By: Linton Ham MD Entered By: Linton Ham on 02/25/2020 14:36:40 -------------------------------------------------------------------------------- Physical Exam Details Patient Name: Date of Service: DOREATHA, OFFER 02/25/2020 1:15 PM Medical Record [id number] Patient Account Number: [id number] Date of Birth/Sex: Treating RN: October 28, 1971 (49 y.o. [name] Primary Care Provider: Inda Coke Other Clinician: Referring Provider: Treating  Provider/Extender:Stace Peace, Annice Needy, Calton Golds in Treatment: 1 Constitutional Patient is hypertensive.. Pulse regular  and within target range for patient.[name] Respirations regular, non-labored and within target range.. Temperature is normal and within the target range for the patient.[name] Appears in no distress. Notes Wound exam; the patient has an open wound on her left lateral foot at the base of the fifth metatarsal. The mirror image side on the right is closed over. Using a #3 curette on the left I removed callus skin from the margins and debris over the surface. Electronic Signature(s) Signed: 02/25/2020 5:45:55 PM By: Linton Ham MD Entered By: Linton Ham on 02/25/2020 14:37:33 -------------------------------------------------------------------------------- Physician Orders Details Patient Name: Date of Service: LIZZY, HAMRE 02/25/2020 1:15 PM Medical Record [id number] Patient Account Number: [id number] Date of Birth/Sex: Treating RN: Jan 27, 1971 (49 y.o. [name] Primary Care Provider: Inda Coke Other Clinician: Referring Provider: Treating Provider/Extender:Feather Berrie, Annice Needy, Calton Golds in Treatment: 1 Verbal / Phone Orders: No Diagnosis Coding ICD-10 Coding Code Description E11.621 Type 2 diabetes mellitus with foot ulcer L97.522 Non-pressure chronic ulcer of other part of left foot with fat layer exposed E11.42 Type 2 diabetes mellitus with diabetic polyneuropathy Follow-up Appointments Return Appointment in 1 week. Dressing Change Frequency Wound #4 Left,Lateral Foot Change Dressing every other day. Wound Cleansing Wound #4 Left,Lateral Foot May shower and wash wound with soap and water. - on days that dressing is changed Primary Wound Dressing Wound #4 Left,Lateral Foot Calcium Alginate with Silver Secondary Dressing Wound #4 Left,Lateral Foot Kerlix/Rolled Gauze - secure with tape Dry Gauze Off-Loading Open  toe surgical shoe to: - both feet Electronic Signature(s) Signed: 02/25/2020 5:45:55 PM By: Linton Ham MD Signed: 02/25/2020 5:57:14 PM By: [name] RN, BSN Entered By: [name] on 02/25/2020 14:15:15 -------------------------------------------------------------------------------- Problem List Details Patient Name: Date of Service: [name] 02/25/2020 1:15 PM Medical Record [id number] Patient Account Number: [id number] Date of Birth/Sex: Treating RN: 11-Mar-1971 (49 y.o. [name] Primary Care Provider: Inda Coke Other Clinician: Referring Provider: Treating Provider/Extender:Dajai Wahlert, Annice Needy, Calton Golds in Treatment: 1 Active Problems ICD-10 Evaluated Encounter Code Description Active Date Today Diagnosis E11.621 Type 2 diabetes mellitus with foot ulcer 02/18/2020 No Yes L97.522 Non-pressure chronic ulcer of other part of left foot 02/18/2020 No Yes with fat layer exposed E11.42 Type 2 diabetes mellitus with diabetic polyneuropathy 02/18/2020 No Yes Inactive Problems Resolved Problems Electronic Signature(s) Signed: 02/25/2020 5:45:55 PM By: Linton Ham MD Entered By: Linton Ham on 02/25/2020 14:35:31 -------------------------------------------------------------------------------- Progress Note Details Patient Name: Date of Service: [name] 02/25/2020 1:15 PM Medical Record [id number] Patient Account Number: [id number] Date of Birth/Sex: Treating RN: Apr 29, 1971 (49 y.o. [name] Primary Care Provider: Inda Coke Other Clinician: Referring Provider: Treating Provider/Extender:Contrina Orona, Annice Needy, Calton Golds in Treatment: 1 Subjective History of Present Illness (HPI) 12/10/16; Heather [name] is a 49 year old type II diabetic with polyneuropathy and stage IV chronic renal failure. 2 weeks ago she noted that she had a burn on the right foot. She suffered this having her foot to  close to a heater at work although she did not realize this until she got home secondary to neuropathy. She has not been putting anything specific on the wound. We apparently cared for her in 2015 with a wound on her left plantar foot. This required a total contact cast. She had not had any subsequent wound issues. He does not have diabetic foot wear. ABI in this clinic was 1.01 on the right 12/16/16; wound is smaller although the patient still describes some drainage using silver alginate changed to Prisma today. 12/31/16  wound is smaller although still a difficult debrided here in surface. Switched from [place] to [place] 01/10/17; patient has a new been area on the right lateral malleolus. This was previously darker skin I wonder if she also burned this at some point and we just didn't recognize it. Using Iodoflex to the lateral foot 01/17/17; patient was in the ER on Saturday with increased right foot pain. They did an x-ray of this that showed no fracture or subluxation. She had soft tissue swelling on the lateral foot as a result of the wound. There was no evidence of bony erosion or destruction to suggest osteomyelitis. They put her on doxycycline 100 twice a day for 7 days. She arrives today with the wound still not in very good condition. Necrotic tissue debrided with a #3 curet. The area on the lateral ankle looks stable 01/27/17 improved today with Santyl. Lateral foot wound is just about closed her original burn injury much better after debridement healthy-looking granulation 02/03/17; lateral foot wound still is open with a nonviable surface. The area over the lateral malleolus also was not closed. There is no evidence of surrounding infection. No evidence of ongoing ischemia 02/10/2017 - patient is here today for evaluation of her right lateral foot and right lateral malleolus ulcerations. She states she's had no issues or concerns over the past week. She states that her blood sugars have  consistently been greater than 200, closer to the 250 range. She states that she saw her PCP last week who manages her diabetes, with no change in her current treatment plan. She has been intimately compliant with wearing a surgical shoe, although the surgical shoe may be causing undue friction and/or pressure to her lateral malleolus. 02/17/17; right lateral foot and right lateral malleolus ulcerations. These were initially burn issues. We have been using Hydrofera Blue 2/29/18; the right lateral malleolus remains closed. Right lateral foot wound is slightly smaller with some depth. Using [facility] 03/03/17 the right lateral malleolus wound remains closed right lateral leg had some necrotic surface material. Using Hydrofera Blue. Change to Silver Collagen 03/10/17; arrives today with complaints of pain on the right lateral foot. Some purulent material noted by her intake nurse. We'll use silver collagen last week 03/17/17; right lateral foot wound has closed. Culture last week staph and strep she is completed doxycycline. The area looks considerably better. READMISSION 02/18/2020 Heather [name] is now a 49 year old woman who we had in the clinic last in 2018 with a burn on her right lateral foot and her right lateral malleolus we eventually close this out. [name] also previously been here in 2015 and 2017. She has type 2 diabetes with peripheral neuropathy. She also has now chronic renal failure on dialysis Tuesday Thursday Saturday. [name] referred here brought by her primary provider for wounds on the mirror image side of her lateral foot left greater than right. She has had a reasonably nice work-up at primary care which is included an ABI done on 02/01/2020 [see below] also an x-ray of the left foot that was normal. She also has an MRI but all she does not currently have an appointment. She tells [name] that she has had the wound on her bilateral feet for 2 months. Noticed by her husband. She has  had a round of Augmentin and is currently on doxycycline she has been using bacitracin. Past medical history; type 2 diabetes with peripheral neuropathy and PAD, chronic renal failure on dialysis. She had a CVA fibromyalgia, was  treated aggressively for aortitis and is currently following this with vascular in [facility]. 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 ABIs on 02/01/2020 showed an ABI of 0.73 on the right and 0.94 on the left. TBI's were normal at 0.86 and 0.73. Waveforms on the right were biphasic and on the left triphasic. 3/1; the area on the right lateral foot seems to have closed over. Left foot smaller by about 2 mm however nonviable tissue around the wound. She says she has been using her surgical shoes although she came in here with slipper- like booties Objective Constitutional Patient is hypertensive.. Pulse regular and within target range for patient.[name] Respirations regular, non-labored and within target range.. Temperature is normal and within the target range for the patient.[name] Appears in no distress. Vitals Time Taken: 1:09 PM, Height: 69 in, Weight: 225 lbs, BMI: 33.2, Temperature: 98.0 F, Pulse: 87 bpm, Respiratory Rate: 18 breaths/min, Blood Pressure: 179/92 mmHg. General Notes: Wound exam; the patient has an open wound on her left lateral foot at the base of the fifth metatarsal. The mirror image side on the right is closed over. Using a #3 curette on the left I removed callus skin from the margins and debris over the surface. Integumentary (Hair, Skin) Wound #4 status is Open. Original cause of wound was Gradually Appeared. The wound is located on the Left,Lateral Foot. The wound measures 0.8cm length x 0.9cm width x 0.1cm depth; 0.565cm^2 area and 0.057cm^3 volume. There is Fat Layer (Subcutaneous Tissue) Exposed exposed. There is no tunneling or undermining noted. There is a medium amount of serosanguineous  drainage noted. The wound margin is flat and intact. There is small (1-33%) pink granulation within the wound bed. There is a large (67-100%) amount of necrotic tissue within the wound bed including Adherent Slough. Assessment Active Problems ICD-10 Type 2 diabetes mellitus with foot ulcer Non-pressure chronic ulcer of other part of left foot with fat layer exposed Type 2 diabetes mellitus with diabetic polyneuropathy Procedures Wound #4 Pre-procedure diagnosis of Wound #4 is a Diabetic Wound/Ulcer of the Lower Extremity located on the Left,Lateral Foot .Severity of Tissue Pre Debridement is: Fat layer exposed. There was a Excisional Skin/Subcutaneous Tissue Debridement with a total area of 0.72 sq cm performed by Ricard Dillon., MD. With the following instrument(s): Curette to remove Viable and Non-Viable tissue/material. Material removed includes Callus, Subcutaneous Tissue, and Skin: Epidermis after achieving pain control using Lidocaine 5% topical ointment. No specimens were taken. A time out was conducted at 14:12, prior to the start of the procedure. A Minimum amount of bleeding was controlled with Pressure. The procedure was tolerated well with a pain level of 0 throughout and a pain level of 0 following the procedure. Post Debridement Measurements: 0.8cm length x 0.9cm width x 0.1cm depth; 0.057cm^3 volume. Character of Wound/Ulcer Post Debridement is improved. Severity of Tissue Post Debridement is: Fat layer exposed. Post procedure Diagnosis Wound #4: Same as Pre-Procedure Plan Follow-up Appointments: Return Appointment in 1 week. Dressing Change Frequency: Wound #4 Left,Lateral Foot: Change Dressing every other day. Wound Cleansing: Wound #4 Left,Lateral Foot: May shower and wash wound with soap and water. - on days that dressing is changed Primary Wound Dressing: Wound #4 Left,Lateral Foot: Calcium Alginate with Silver Secondary Dressing: Wound #4 Left,Lateral  Foot: Kerlix/Rolled Gauze - secure with tape Dry Gauze Off-Loading: Open toe surgical shoe to: - both feet 1. Continue silver alginate to the left lateral foot 2. I stressed the  importance of offloading this area both in her shoes and at night. Electronic Signature(s) Signed: 02/25/2020 5:45:55 PM By: Linton Ham MD Entered By: Linton Ham on 02/25/2020 14:38:13 -------------------------------------------------------------------------------- SuperBill Details Patient Name: Date of Service: [name] 02/25/2020 Medical Record [id number] Patient Account Number: [id number] Date of Birth/Sex: Treating RN: 1971-12-16 (49 y.o. [name] Primary Care Provider: Inda Coke Other Clinician: Referring Provider: Treating Provider/Extender:Elyjah Hazan, Annice Needy, Calton Golds in Treatment: 1 Diagnosis Coding ICD-10 Codes Code Description E11.621 Type 2 diabetes mellitus with foot ulcer L97.522 Non-pressure chronic ulcer of other part of left foot with fat layer exposed E11.42 Type 2 diabetes mellitus with diabetic polyneuropathy Facility Procedures CPT4 Code Description: [id number] 11042 - DEB SUBQ TISSUE 20 SQ CM/< ICD-10 Diagnosis Description L97.522 Non-pressure chronic ulcer of other part of left foot with f Modifier: at layer ex Quantity: 1 posed Physician Procedures Electronic Signature(s) Signed: 02/25/2020 5:45:55 PM By: Linton Ham MD Entered By: Linton Ham on 02/25/2020 14:38:25

## 2020-02-29 ENCOUNTER — Other Ambulatory Visit (HOSPITAL_COMMUNITY): Payer: Managed Care, Other (non HMO)

## 2020-03-03 ENCOUNTER — Encounter (HOSPITAL_BASED_OUTPATIENT_CLINIC_OR_DEPARTMENT_OTHER): Payer: Managed Care, Other (non HMO) | Admitting: Internal Medicine

## 2020-03-03 ENCOUNTER — Encounter (HOSPITAL_COMMUNITY)
Admission: RE | Disposition: A | Discharge status: Home / Self Care | Payer: Self-pay | Source: Home / Self Care | Attending: Vascular Surgery

## 2020-03-03 ENCOUNTER — Ambulatory Visit (HOSPITAL_COMMUNITY)
Admission: RE | Admit: 2020-03-03 | Discharge: 2020-03-03 | Disposition: A | Discharge status: Home / Self Care | Payer: Managed Care, Other (non HMO) | Attending: Vascular Surgery | Admitting: Vascular Surgery

## 2020-03-03 ENCOUNTER — Inpatient Hospital Stay: Admit: 2020-03-03 | Payer: Managed Care, Other (non HMO) | Admitting: Vascular Surgery

## 2020-03-03 ENCOUNTER — Other Ambulatory Visit: Payer: Self-pay

## 2020-03-03 DIAGNOSIS — Z20822 Contact with and (suspected) exposure to covid-19: Secondary | ICD-10-CM | POA: Insufficient documentation

## 2020-03-03 DIAGNOSIS — E11621 Type 2 diabetes mellitus with foot ulcer: Secondary | ICD-10-CM | POA: Diagnosis not present

## 2020-03-03 DIAGNOSIS — Z7901 Long term (current) use of anticoagulants: Secondary | ICD-10-CM | POA: Diagnosis not present

## 2020-03-03 DIAGNOSIS — Z79899 Other long term (current) drug therapy: Secondary | ICD-10-CM | POA: Insufficient documentation

## 2020-03-03 DIAGNOSIS — N186 End stage renal disease: Secondary | ICD-10-CM | POA: Insufficient documentation

## 2020-03-03 DIAGNOSIS — Z794 Long term (current) use of insulin: Secondary | ICD-10-CM | POA: Diagnosis not present

## 2020-03-03 DIAGNOSIS — L97529 Non-pressure chronic ulcer of other part of left foot with unspecified severity: Secondary | ICD-10-CM | POA: Diagnosis not present

## 2020-03-03 DIAGNOSIS — E1122 Type 2 diabetes mellitus with diabetic chronic kidney disease: Secondary | ICD-10-CM | POA: Insufficient documentation

## 2020-03-03 DIAGNOSIS — N185 Chronic kidney disease, stage 5: Secondary | ICD-10-CM | POA: Diagnosis not present

## 2020-03-03 DIAGNOSIS — Z992 Dependence on renal dialysis: Secondary | ICD-10-CM | POA: Insufficient documentation

## 2020-03-03 DIAGNOSIS — Z86711 Personal history of pulmonary embolism: Secondary | ICD-10-CM | POA: Diagnosis not present

## 2020-03-03 HISTORY — PX: A/V FISTULAGRAM: CATH118298

## 2020-03-03 LAB — RESPIRATORY PANEL BY RT PCR (FLU A&B, COVID)
Influenza A by PCR: NEGATIVE
Influenza B by PCR: NEGATIVE
SARS Coronavirus 2 by RT PCR: NEGATIVE

## 2020-03-03 LAB — POCT I-STAT, CHEM 8
BUN: 50 mg/dL — ABNORMAL HIGH (ref 6–20)
Calcium, Ion: 1.12 mmol/L — ABNORMAL LOW (ref 1.15–1.40)
Chloride: 100 mmol/L (ref 98–111)
Creatinine, Ser: 8.9 mg/dL — ABNORMAL HIGH (ref 0.44–1.00)
Glucose, Bld: 254 mg/dL — ABNORMAL HIGH (ref 70–99)
HCT: 43 % (ref 36.0–46.0)
Hemoglobin: 14.6 g/dL (ref 12.0–15.0)
Potassium: 7 mmol/L (ref 3.5–5.1)
Sodium: 132 mmol/L — ABNORMAL LOW (ref 135–145)
TCO2: 30 mmol/L (ref 22–32)

## 2020-03-03 LAB — HCG, SERUM, QUALITATIVE: Preg, Serum: NEGATIVE

## 2020-03-03 LAB — POTASSIUM: Potassium: 4.6 mmol/L (ref 3.5–5.1)

## 2020-03-03 SURGERY — INSERTION OF ARTERIOVENOUS (AV) GORE-TEX GRAFT THIGH
Anesthesia: Monitor Anesthesia Care | Laterality: Right

## 2020-03-03 SURGERY — A/V FISTULAGRAM
Anesthesia: LOCAL | Laterality: Right

## 2020-03-03 MED ORDER — SODIUM CHLORIDE 0.9% FLUSH: 3.0000 mL | INTRAVENOUS | Status: DC | PRN

## 2020-03-03 MED ORDER — SODIUM CHLORIDE 0.9 % IV SOLN: 250.0000 mL | INTRAVENOUS | Status: DC | PRN

## 2020-03-03 MED ORDER — SODIUM CHLORIDE 0.9% FLUSH: 3.0000 mL | Freq: Two times a day (BID) | INTRAVENOUS | Status: DC

## 2020-03-03 MED ORDER — IODIXANOL 320 MG/ML IV SOLN
INTRAVENOUS | Status: DC | PRN
  Administered 2020-03-03: 10 mL

## 2020-03-03 SURGICAL SUPPLY — 2 items
STOPCOCK MORSE 400PSI 3WAY (MISCELLANEOUS) ×2 IMPLANT
TUBING CIL FLEX 10 FLL-RA (TUBING) ×2 IMPLANT

## 2020-03-03 NOTE — Op Note (Signed)
    Patient name: Heather [name] MRN: [id number] DOB: 10-Feb-1971 Sex: female  03/03/2020 Pre-operative Diagnosis: End-stage renal disease Post-operative diagnosis:  Same Surgeon:  Erlene Quan C. Donzetta Matters, MD Procedure Performed: Right upper extremity venography  Indications: 49 year old female with end-stage renal disease.  [name] currently dialyzing via catheter.  [name] here for right upper extremity venography for possible planning of dialysis access.  Findings: Essentially her veins appear patent.  I discussed with her that we will plan for right upper extremity axillary to axillary graft.  If we cannot find a vein the time of surgery would consider hero grafting.   Procedure:  The patient was identified in the holding area and taken to room 8.  The patient was then placed supine on the table and prepped and draped in the usual sterile fashion.  A time out was called.  She had IVs placed right upper extremity prior to presenting to the [place] lab.  Contrast was injected the vein images were interpreted as above.  She will be scheduled for outpatient surgery on a nondialysis day.  Contrast: 10cc   Heather [name] C. Donzetta Matters, MD [facility] Office: [phone] Pager: [phone]

## 2020-03-03 NOTE — H&P (Signed)
   History and Physical Update  The patient was interviewed and re-examined.  The patient's previous History and Physical has been reviewed and is unchanged from recent office visit. Plan for venography right upper extremity.   Tyrihanna Wingert C. Donzetta Matters, MD [facility] [place] Office: [phone] Pager: [phone]   03/03/2020, 9:33 AM

## 2020-03-03 NOTE — Progress Notes (Signed)
Heather, [name] ([id number]) Visit Report for 03/03/2020 Debridement Details Patient Name: Heather [name], Heather [name]. Date of Service: 03/03/2020 12:30 PM Medical Record [id number] Patient Account Number: [id number] Date of Birth/Sex: Treating RN: January 03, 1971 (49 y.o. [name] Primary Care Provider: Inda Coke Other Clinician: Referring Provider: Treating Provider/Extender:Shatarra Wehling, Annice Needy, Calton Golds in Treatment: 2 Debridement Performed for Wound #4 Left,Lateral Foot Assessment: Performed By: Physician Ricard Dillon., MD Debridement Type: Debridement Severity of Tissue Pre Fat layer exposed Debridement: Level of Consciousness (Pre- Awake and Alert procedure): Pre-procedure Verification/Time Out Taken: Yes - 13:13 Start Time: 13:13 Total Area Debrided (L x W): 0.7 (cm) x 0.6 (cm) = 0.42 (cm) Tissue and other material Viable, Non-Viable, Callus, Subcutaneous debrided: Level: Skin/Subcutaneous Tissue Debridement Description: Excisional Instrument: Curette Bleeding: Minimum Hemostasis Achieved: Pressure End Time: 13:15 Procedural Pain: 0 Post Procedural Pain: 0 Response to Treatment: Procedure was tolerated well Level of Consciousness Awake and Alert (Post-procedure): Post Debridement Measurements of Total Wound Length: (cm) 0.7 Width: (cm) 0.6 Depth: (cm) 0.3 Volume: (cm) 0.099 Character of Wound/Ulcer Post Improved Debridement: Severity of Tissue Post Debridement: Fat layer exposed Post Procedure Diagnosis Same as Pre-procedure Electronic Signature(s) Signed: 03/03/2020 5:29:15 PM By: Linton Ham MD Signed: 03/03/2020 5:59:01 PM By: [name] RN, BSN Entered By: Linton Ham on 03/03/2020 13:18:50 -------------------------------------------------------------------------------- HPI Details Patient Name: Heather [name]. Date of Service: 03/03/2020 12:30 PM Medical Record [id number] Patient Account Number:  [id number] Date of Birth/Sex: Treating RN: Apr 14, 1971 (49 y.o. [name] Primary Care Provider: Inda Coke Other Clinician: Referring Provider: Treating Provider/Extender:Deazia Lampi, Annice Needy, Calton Golds in Treatment: 2 History of Present Illness HPI Description: 12/10/16; Heather [name] is a 49 year old type II diabetic with polyneuropathy and stage IV chronic renal failure. 2 weeks ago she noted that she had a burn on the right foot. She suffered this having her foot to close to a heater at work although she did not realize this until she got home secondary to neuropathy. She has not been putting anything specific on the wound. We apparently cared for her in 2015 with a wound on her left plantar foot. This required a total contact cast. She had not had any subsequent wound issues. He does not have diabetic foot wear. ABI in this clinic was 1.01 on the right 12/16/16; wound is smaller although the patient still describes some drainage using silver alginate changed to Prisma today. 12/31/16 wound is smaller although still a difficult debrided here in surface. Switched from [place] to [place] 01/10/17; patient has a new been area on the right lateral malleolus. This was previously darker skin I wonder if she also burned this at some point and we just didn't recognize it. Using Iodoflex to the lateral foot 01/17/17; patient was in the ER on Saturday with increased right foot pain. They did an x-ray of this that showed no fracture or subluxation. She had soft tissue swelling on the lateral foot as a result of the wound. There was no evidence of bony erosion or destruction to suggest osteomyelitis. They put her on doxycycline 100 twice a day for 7 days. She arrives today with the wound still not in very good condition. Necrotic tissue debrided with a #3 curet. The area on the lateral ankle looks stable 01/27/17 improved today with Santyl. Lateral foot wound is just about closed her  original burn injury much better after debridement healthy-looking granulation 02/03/17; lateral foot wound still is open with a nonviable surface. The area over the lateral malleolus also  was not closed. There is no evidence of surrounding infection. No evidence of ongoing ischemia 02/10/2017 - patient is here today for evaluation of her right lateral foot and right lateral malleolus ulcerations. She states she's had no issues or concerns over the past week. She states that her blood sugars have consistently been greater than 200, closer to the 250 range. She states that she saw her PCP last week who manages her diabetes, with no change in her current treatment plan. She has been intimately compliant with wearing a surgical shoe, although the surgical shoe may be causing undue friction and/or pressure to her lateral malleolus. 02/17/17; right lateral foot and right lateral malleolus ulcerations. These were initially burn issues. We have been using Hydrofera Blue 2/29/18; the right lateral malleolus remains closed. Right lateral foot wound is slightly smaller with some depth. Using [facility] 03/03/17 the right lateral malleolus wound remains closed right lateral leg had some necrotic surface material. Using Hydrofera Blue. Change to Silver Collagen 03/10/17; arrives today with complaints of pain on the right lateral foot. Some purulent material noted by her intake nurse. We'll use silver collagen last week 03/17/17; right lateral foot wound has closed. Culture last week staph and strep she is completed doxycycline. The area looks considerably better. READMISSION 02/18/2020 Heather [name] is now a 49 year old woman who we had in the clinic last in 2018 with a burn on her right lateral foot and her right lateral malleolus we eventually close this out. [name] also previously been here in 2015 and 2017. She has type 2 diabetes with peripheral neuropathy. She also has now chronic renal failure on dialysis  Tuesday Thursday Saturday. [name] referred here brought by her primary provider for wounds on the mirror image side of her lateral foot left greater than right. She has had a reasonably nice work-up at primary care which is included an ABI done on 02/01/2020 [see below] also an x-ray of the left foot that was normal. She also has an MRI but all she does not currently have an appointment. She tells [name] that she has had the wound on her bilateral feet for 2 months. Noticed by her husband. She has had a round of Augmentin and is currently on doxycycline she has been using bacitracin. Past medical history; type 2 diabetes with peripheral neuropathy and PAD, chronic renal failure on dialysis. She had a CVA fibromyalgia, was treated aggressively for aortitis and is currently following this with vascular in [facility]. 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 ABIs on 02/01/2020 showed an ABI of 0.73 on the right and 0.94 on the left. TBI's were normal at 0.86 and 0.73. Waveforms on the right were biphasic and on the left triphasic. 3/1; the area on the right lateral foot seems to have closed over. Left foot smaller by about 2 mm however nonviable tissue around the wound. She says she has been using her surgical shoes although she came in here with slipper- like booties 3/8; right lateral foot still is closed although there is some callus. Left foot is smaller callus around the edges some debris on the surface wound Electronic Signature(s) Signed: 03/03/2020 5:29:15 PM By: Linton Ham MD Entered By: Linton Ham on 03/03/2020 13:19:20 -------------------------------------------------------------------------------- Physical Exam Details Patient Name: Heather Medicus K. Date of Service: 03/03/2020 12:30 PM Medical Record [id number] Patient Account Number: [id number] Date of Birth/Sex: Treating RN: 1971/10/28 (49 y.o. [name] Primary Care Provider: Inda Coke Other  Clinician: Referring Provider: Treating Provider/Extender:Eyonna Sandstrom, Annice Needy, Calton Golds in Treatment: 2 Constitutional Patient is hypertensive.. Pulse regular and within target range for patient.[name] Respirations regular, non-labored and within target range.. Temperature is normal and within the target range for the patient.[name] Appears in no distress. Notes Wound exam; the patient has an open wound on the left lateral foot roughly at the base of the fifth metatarsal. Debris around the surface including callus some subcutaneous tissue over the surface of wound debrided with a #3 curette bleeding controlled with direct pressure. Electronic Signature(s) Signed: 03/03/2020 5:29:15 PM By: Linton Ham MD Entered By: Linton Ham on 03/03/2020 13:19:58 -------------------------------------------------------------------------------- Physician Orders Details Patient Name: Heather [name]. Date of Service: 03/03/2020 12:30 PM Medical Record [id number] Patient Account Number: [id number] Date of Birth/Sex: Treating RN: 02/15/1971 (49 y.o. [name] Primary Care Provider: Inda Coke Other Clinician: Referring Provider: Treating Provider/Extender:Chanon Loney, Annice Needy, Calton Golds in Treatment: 2 Verbal / Phone Orders: No Diagnosis Coding ICD-10 Coding Code Description E11.621 Type 2 diabetes mellitus with foot ulcer L97.522 Non-pressure chronic ulcer of other part of left foot with fat layer exposed E11.42 Type 2 diabetes mellitus with diabetic polyneuropathy Follow-up Appointments Return Appointment in 1 week. Dressing Change Frequency Wound #4 Left,Lateral Foot Change Dressing every other day. Wound Cleansing Wound #4 Left,Lateral Foot May shower and wash wound with soap and water. - on days that dressing is changed Primary Wound Dressing Wound #4 Left,Lateral Foot Calcium Alginate with  Silver Secondary Dressing Wound #4 Left,Lateral Foot Kerlix/Rolled Gauze - secure with tape Dry Gauze Off-Loading Open toe surgical shoe to: - both feet Electronic Signature(s) Signed: 03/03/2020 5:29:15 PM By: Linton Ham MD Signed: 03/03/2020 5:59:01 PM By: [name] RN, BSN Entered By: [name] on 03/03/2020 13:13:50 -------------------------------------------------------------------------------- Problem List Details Patient Name: Heather Medicus K. Date of Service: 03/03/2020 12:30 PM Medical Record [id number] Patient Account Number: [id number] Date of Birth/Sex: Treating RN: [birth year] (49 y.o. [name] Primary Care Provider: Inda Coke Other Clinician: Referring Provider: Treating Provider/Extender:Heiress Williamson, Annice Needy, Calton Golds in Treatment: 2 Active Problems ICD-10 Evaluated Encounter Code Description Active Date Today Diagnosis E11.621 Type 2 diabetes mellitus with foot ulcer 02/18/2020 No Yes L97.522 Non-pressure chronic ulcer of other part of left foot 02/18/2020 No Yes with fat layer exposed E11.42 Type 2 diabetes mellitus with diabetic polyneuropathy 02/18/2020 No Yes Inactive Problems Resolved Problems Electronic Signature(s) Signed: 03/03/2020 5:29:15 PM By: Linton Ham MD Entered By: Linton Ham on 03/03/2020 13:18:29 -------------------------------------------------------------------------------- Progress Note Details Patient Name: Heather [name]. Date of Service: 03/03/2020 12:30 PM Medical Record [id number] Patient Account Number: [id number] Date of Birth/Sex: Treating RN: Jul 16, 1971 (49 y.o. [name] Primary Care Provider: Inda Coke Other Clinician: Referring Provider: Treating Provider/Extender:Conchetta Lamia, Annice Needy, Calton Golds in Treatment: 2 Subjective History of Present Illness (HPI) 12/10/16; Heather [name] is a 49 year old type II diabetic with polyneuropathy and stage  IV chronic renal failure. 2 weeks ago she noted that she had a burn on the right foot. She suffered this having her foot to close to a heater at work although she did not realize this until she got home secondary to neuropathy. She has not been putting anything specific on the wound. We apparently cared for her in 2015 with a wound on her left plantar foot. This required a total contact cast. She had not had any subsequent wound issues. He does not have diabetic foot wear. ABI in this clinic was 1.01 on the right 12/16/16; wound is smaller although  the patient still describes some drainage using silver alginate changed to Prisma today. 12/31/16 wound is smaller although still a difficult debrided here in surface. Switched from [place] to [place] 01/10/17; patient has a new been area on the right lateral malleolus. This was previously darker skin I wonder if she also burned this at some point and we just didn't recognize it. Using Iodoflex to the lateral foot 01/17/17; patient was in the ER on Saturday with increased right foot pain. They did an x-ray of this that showed no fracture or subluxation. She had soft tissue swelling on the lateral foot as a result of the wound. There was no evidence of bony erosion or destruction to suggest osteomyelitis. They put her on doxycycline 100 twice a day for 7 days. She arrives today with the wound still not in very good condition. Necrotic tissue debrided with a #3 curet. The area on the lateral ankle looks stable 01/27/17 improved today with Santyl. Lateral foot wound is just about closed her original burn injury much better after debridement healthy-looking granulation 02/03/17; lateral foot wound still is open with a nonviable surface. The area over the lateral malleolus also was not closed. There is no evidence of surrounding infection. No evidence of ongoing ischemia 02/10/2017 - patient is here today for evaluation of her right lateral foot and right lateral  malleolus ulcerations. She states she's had no issues or concerns over the past week. She states that her blood sugars have consistently been greater than 200, closer to the 250 range. She states that she saw her PCP last week who manages her diabetes, with no change in her current treatment plan. She has been intimately compliant with wearing a surgical shoe, although the surgical shoe may be causing undue friction and/or pressure to her lateral malleolus. 02/17/17; right lateral foot and right lateral malleolus ulcerations. These were initially burn issues. We have been using Hydrofera Blue 2/29/18; the right lateral malleolus remains closed. Right lateral foot wound is slightly smaller with some depth. Using [facility] 03/03/17 the right lateral malleolus wound remains closed right lateral leg had some necrotic surface material. Using Hydrofera Blue. Change to Silver Collagen 03/10/17; arrives today with complaints of pain on the right lateral foot. Some purulent material noted by her intake nurse. We'll use silver collagen last week 03/17/17; right lateral foot wound has closed. Culture last week staph and strep she is completed doxycycline. The area looks considerably better. READMISSION 02/18/2020 Heather [name] is now a 49 year old woman who we had in the clinic last in 2018 with a burn on her right lateral foot and her right lateral malleolus we eventually close this out. [name] also previously been here in 2015 and 2017. She has type 2 diabetes with peripheral neuropathy. She also has now chronic renal failure on dialysis Tuesday Thursday Saturday. [name] referred here brought by her primary provider for wounds on the mirror image side of her lateral foot left greater than right. She has had a reasonably nice work-up at primary care which is included an ABI done on 02/01/2020 [see below] also an x-ray of the left foot that was normal. She also has an MRI but all she does not currently have an  appointment. She tells [name] that she has had the wound on her bilateral feet for 2 months. Noticed by her husband. She has had a round of Augmentin and is currently on doxycycline she has been using bacitracin. Past medical history; type 2 diabetes with peripheral  neuropathy and PAD, chronic renal failure on dialysis. She had a CVA fibromyalgia, was treated aggressively for aortitis and is currently following this with vascular in [facility]. 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 ABIs on 02/01/2020 showed an ABI of 0.73 on the right and 0.94 on the left. TBI's were normal at 0.86 and 0.73. Waveforms on the right were biphasic and on the left triphasic. 3/1; the area on the right lateral foot seems to have closed over. Left foot smaller by about 2 mm however nonviable tissue around the wound. She says she has been using her surgical shoes although she came in here with slipper- like booties 3/8; right lateral foot still is closed although there is some callus. Left foot is smaller callus around the edges some debris on the surface wound Objective Constitutional Patient is hypertensive.. Pulse regular and within target range for patient.[name] Respirations regular, non-labored and within target range.. Temperature is normal and within the target range for the patient.[name] Appears in no distress. Vitals Time Taken: 12:59 PM, Height: 69 in, Weight: 225 lbs, BMI: 33.2, Temperature: 98 F, Pulse: 92 bpm, Respiratory Rate: 18 breaths/min, Blood Pressure: 169/89 mmHg. General Notes: Wound exam; the patient has an open wound on the left lateral foot roughly at the base of the fifth metatarsal. Debris around the surface including callus some subcutaneous tissue over the surface of wound debrided with a #3 curette bleeding controlled with direct pressure. Integumentary (Hair, Skin) Wound #4 status is Open. Original cause of wound was Gradually Appeared.  The wound is located on the Left,Lateral Foot. The wound measures 0.7cm length x 0.6cm width x 0.3cm depth; 0.33cm^2 area and 0.099cm^3 volume. There is Fat Layer (Subcutaneous Tissue) Exposed exposed. There is no tunneling or undermining noted. There is a medium amount of serosanguineous drainage noted. The wound margin is flat and intact. There is small (1-33%) pink granulation within the wound bed. There is a large (67-100%) amount of necrotic tissue within the wound bed including Adherent Slough. Assessment Active Problems ICD-10 Type 2 diabetes mellitus with foot ulcer Non-pressure chronic ulcer of other part of left foot with fat layer exposed Type 2 diabetes mellitus with diabetic polyneuropathy Procedures Wound #4 Pre-procedure diagnosis of Wound #4 is a Diabetic Wound/Ulcer of the Lower Extremity located on the Left,Lateral Foot .Severity of Tissue Pre Debridement is: Fat layer exposed. There was a Excisional Skin/Subcutaneous Tissue Debridement with a total area of 0.42 sq cm performed by Ricard Dillon., MD. With the following instrument(s): Curette to remove Viable and Non-Viable tissue/material. Material removed includes Callus and Subcutaneous Tissue and. No specimens were taken. A time out was conducted at 13:13, prior to the start of the procedure. A Minimum amount of bleeding was controlled with Pressure. The procedure was tolerated well with a pain level of 0 throughout and a pain level of 0 following the procedure. Post Debridement Measurements: 0.7cm length x 0.6cm width x 0.3cm depth; 0.099cm^3 volume. Character of Wound/Ulcer Post Debridement is improved. Severity of Tissue Post Debridement is: Fat layer exposed. Post procedure Diagnosis Wound #4: Same as Pre-Procedure Plan Follow-up Appointments: Return Appointment in 1 week. Dressing Change Frequency: Wound #4 Left,Lateral Foot: Change Dressing every other day. Wound Cleansing: Wound #4 Left,Lateral  Foot: May shower and wash wound with soap and water. - on days that dressing is changed Primary Wound Dressing: Wound #4 Left,Lateral Foot: Calcium Alginate with Silver Secondary Dressing: Wound #4 Left,Lateral Foot: Kerlix/Rolled Gauze - secure  with tape Dry Gauze Off-Loading: Open toe surgical shoe to: - both feet 1. Continue with silver alginate Electronic Signature(s) Signed: 03/03/2020 5:29:15 PM By: Linton Ham MD Entered By: Linton Ham on 03/03/2020 13:21:43 -------------------------------------------------------------------------------- SuperBill Details Patient Name: Date of Service: Heather [name] 03/03/2020 Medical Record [id number] Patient Account Number: [id number] Date of Birth/Sex: Treating RN: 08-24-1971 (49 y.o. [name] Primary Care Provider: Inda Coke Other Clinician: Referring Provider: Treating Provider/Extender:Myasia Sinatra, Annice Needy, Calton Golds in Treatment: 2 Diagnosis Coding ICD-10 Codes Code Description E11.621 Type 2 diabetes mellitus with foot ulcer L97.522 Non-pressure chronic ulcer of other part of left foot with fat layer exposed E11.42 Type 2 diabetes mellitus with diabetic polyneuropathy Facility Procedures CPT4 Code Description: [id number] - DEB SUBQ TISSUE 20 SQ CM/< ICD-10 Diagnosis Description L97.522 Non-pressure chronic ulcer of other part of left foot with Modifier: fat layer ex Quantity: 1 posed Physician Procedures Electronic Signature(s) Signed: 03/03/2020 5:29:15 PM By: Linton Ham MD Entered By: Linton Ham on 03/03/2020 13:21:54

## 2020-03-03 NOTE — Discharge Instructions (Signed)
Venogram A venogram, or venography, is a procedure that uses an X-ray and dye (contrast) to examine how well the veins work and how blood flows through them. Contrast helps the veins show up on X-rays. A venogram may be done:  To evaluate abnormalities in the vein.  To identify clots within veins, such as deep vein thrombosis (DVT).  To map out the veins that might be needed for another procedure. Tell a health care provider about:  Any allergies you have, especially to medicines, shellfish, iodine, and contrast.  All medicines you are taking, including vitamins, herbs, eye drops, creams, and over-the-counter medicines.  Any problems you or family members have had with anesthetic medicines.  Any blood disorders you have.  Any surgeries you have had and any complications that occurred.  Any medical conditions you have.  Whether you are pregnant, may be pregnant, or are breastfeeding.  Any history of smoking or tobacco use. What are the risks? Generally, this is a safe procedure. However, problems may occur, including:  Infection.  Bleeding.  Blood clots.  Allergic reaction to medicines or contrast.  Damage to other structures or organs.  Kidney problems.  Increased risk of cancer. Being exposed to too much radiation over a lifetime can increase the risk of cancer. The risk is small. What happens before the procedure? Medicines Ask your health care provider about:  Changing or stopping your regular medicines. This is especially important if you are taking diabetes medicines or blood thinners.  Taking medicines such as aspirin and ibuprofen. These medicines can thin your blood. Do not take these medicines unless your health care provider tells you to take them.  Taking over-the-counter medicines, vitamins, herbs, and supplements. General instructions  Follow instructions from your health care provider about eating or drinking restrictions.  You may have blood tests  to check how well your kidneys and liver are working and how well your blood can clot.  Plan to have someone take you home from the hospital or clinic. What happens during the procedure?   An IV will be inserted into one of your veins.  You may be given a medicine to help you relax (sedative).  You will lie down on an X-ray table. The table may be tilted in different directions during the procedure to help the contrast move throughout your body. Safety straps will keep you secure if the table is tilted.  If veins in your arm or leg will be examined, a band may be wrapped around that arm or leg to keep the veins full of blood. This may cause your arm or leg to feel numb.  The contrast will be injected into your IV. You may have a hot, flushed feeling as it moves throughout your body. You may also have a metallic taste in your mouth. Both of these sensations will go away after the test is complete.  You may be asked to lie in different positions or place your legs or arms in different positions.  At the end of the procedure, you may be given IV fluids to help wash or flush the contrast out of your veins.  The IV will be removed, and pressure will be applied to the IV site to prevent bleeding. A bandage (dressing) may be applied to the IV site. The exact procedure may vary among health care providers and hospitals. What can I expect after the procedure?  Your blood pressure, heart rate, breathing rate, and blood oxygen level will be monitored until you   leave the hospital or clinic.  You may be given something to eat and drink.  You may have bruising or mild discomfort in the area where the IV was inserted. Follow these instructions at home: Eating and drinking   Follow instructions from your health care provider about eating or drinking restrictions.  Drink a lot of water for the first several days after the procedure, as directed by your health care provider. This helps to flush the  contrast out of your body. Activity  Rest as told by your health care provider.  Return to your normal activities as told by your health care provider. Ask your health care provider what activities are safe for you.  If you were given a sedative during your procedure, do not drive for 24 hours or until your health care provider approves. General instructions  Check your IV insertion area every day for signs of infection. Check for: ? Redness, swelling, or pain. ? Fluid or blood. ? Warmth. ? Pus or a bad smell.  Take over-the-counter and prescription medicines only as told by your health care provider.  Keep all follow-up visits as told by your health care provider. This is important. Contact a health care provider if:  Your skin becomes itchy or you develop a rash or hives.  You have a fever that does not get better with medicine.  You feel nauseous or you vomit.  You have redness, swelling, or pain around the insertion site.  You have fluid or blood coming from the insertion site.  Your insertion area feels warm to the touch.  You have pus or a bad smell coming from the insertion site. Get help right away if you:  Have shortness of breath or difficulty breathing.  Develop chest pain.  Faint.  Feel very dizzy. These symptoms may represent a serious problem that is an emergency. Do not wait to see if the symptoms will go away. Get medical help right away. Call your local emergency services (911 in the [place] Do not drive yourself to the hospital. Summary  A venogram, or venography, is a procedure that uses an X-ray and contrast dye to check how well the veins work and how blood flows through them.  An IV will be inserted into one of your veins in order to inject the contrast.  During the exam, you will lie on an X-ray table. The table may be tilted in different directions during the procedure to help the contrast move throughout your body. Safety straps will keep you  secure.  After the procedure, you will need to drink a lot of water to help wash or flush the contrast out of your body. This information is not intended to replace advice given to you by your health care provider. Make sure you discuss any questions you have with your health care provider. Document Revised: 07/21/2019 Document Reviewed: 07/21/2019 Elsevier Patient Education  2020 [employer]  

## 2020-03-04 ENCOUNTER — Other Ambulatory Visit: Payer: Self-pay

## 2020-03-10 ENCOUNTER — Encounter (HOSPITAL_BASED_OUTPATIENT_CLINIC_OR_DEPARTMENT_OTHER): Payer: Managed Care, Other (non HMO) | Admitting: Internal Medicine

## 2020-03-11 ENCOUNTER — Telehealth: Payer: Self-pay | Admitting: Physician Assistant

## 2020-03-11 ENCOUNTER — Encounter (HOSPITAL_BASED_OUTPATIENT_CLINIC_OR_DEPARTMENT_OTHER): Payer: Managed Care, Other (non HMO) | Admitting: Internal Medicine

## 2020-03-11 ENCOUNTER — Other Ambulatory Visit: Payer: Self-pay

## 2020-03-11 DIAGNOSIS — E11621 Type 2 diabetes mellitus with foot ulcer: Secondary | ICD-10-CM | POA: Diagnosis not present

## 2020-03-11 NOTE — Progress Notes (Signed)
[name], [name] ([id number]) Visit Report for 03/11/2020 SuperBill Details Patient Name: Heather [name], Heather [name]. Date of Service: 03/11/2020 Medical Record [id number] Patient Account Number: [id number] Date of Birth/Sex: Treating RN: 1971/05/07 (49 y.o. 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

## 2020-03-11 NOTE — Progress Notes (Signed)
DEARA, BOBER ([id number]) Visit Report for 03/11/2020 Arrival Information Details Patient Name: Heather [name], Heather [name]. Date of Service: 03/11/2020 2:45 PM Medical Record [id number] Patient Account Number: [id number] Date of Birth/Sex: Treating RN: [birth year] (49 y.o. Clearnce Sorrel Primary Care Roza Creamer: Inda Coke Other Clinician: Referring Dawnette Mione: Treating Roniqua Kintz/Extender:Robson, Annice Needy, Calton Golds in Treatment: 3 Visit Information History Since Last Visit Added or deleted any medications: No Patient Arrived: Gilford Rile Any new allergies or adverse reactions: No Arrival Time: 15:35 Had a fall or experienced change in No Accompanied By: self activities of daily living that may affect Transfer Assistance: None risk of falls: Patient Identification Verified: Yes Signs or symptoms of abuse/neglect since last No Secondary Verification Process Yes visito Completed: Hospitalized since last visit: No Patient Has Alerts: Yes Implantable device outside of the clinic excluding No Patient Alerts: Patient on Blood cellular tissue based products placed in the center Thinner since last visit: ABI left .94 Has Dressing in Place as Prescribed: Yes ABI right .73 Pain Present Now: No Electronic Signature(s) Signed: 03/11/2020 5:07:08 PM By: Kela Millin Entered By: Kela Millin on 03/11/2020 15:35:32 -------------------------------------------------------------------------------- Clinic Level of Care Assessment Details Patient Name: Heather [name], Heather [name]. Date of Service: 03/11/2020 2:45 PM Medical Record [id number] Patient Account Number: [id number] Date of Birth/Sex: Treating RN: Nov 06, 1971 (49 y.o. Clearnce Sorrel Primary Care Arihanna Estabrook: Inda Coke Other Clinician: Referring Tailor Lucking: Treating Kaliope Quinonez/Extender:Robson, Annice Needy, Calton Golds in Treatment: 3 Clinic Level of Care Assessment Items TOOL 4  Quantity Score X - Use when only an EandM is performed on FOLLOW-UP visit 1 0 ASSESSMENTS - Nursing Assessment / Reassessment X - Reassessment of Co-morbidities (includes updates in patient status) 1 10 X - Reassessment of Adherence to Treatment Plan 1 5 ASSESSMENTS - Wound and Skin Assessment / Reassessment X - Simple Wound Assessment / Reassessment - one wound 1 5 []  - Complex Wound Assessment / Reassessment - multiple wounds 0 []  - Dermatologic / Skin Assessment (not related to wound area) 0 ASSESSMENTS - Focused Assessment []  - Circumferential Edema Measurements - multi extremities 0 []  - Nutritional Assessment / Counseling / Intervention 0 []  - Lower Extremity Assessment (monofilament, tuning fork, pulses) 0 []  - Peripheral Arterial Disease Assessment (using hand held doppler) 0 ASSESSMENTS - Ostomy and/or Continence Assessment and Care []  - Incontinence Assessment and Management 0 []  - Ostomy Care Assessment and Management (repouching, etc.) 0 PROCESS - Coordination of Care X - Simple Patient / Family Education for ongoing care 1 15 []  - Complex (extensive) Patient / Family Education for ongoing care 0 X - Staff obtains Programmer, systems, Records, Test Results / Process Orders 1 10 []  - Staff telephones HHA, Nursing Homes / Clarify orders / etc 0 []  - Routine Transfer to another Facility (non-emergent condition) 0 []  - Routine Hospital Admission (non-emergent condition) 0 []  - New Admissions / Biomedical engineer / Ordering NPWT, Apligraf, etc. 0 []  - Emergency Hospital Admission (emergent condition) 0 X - Simple Discharge Coordination 1 10 []  - Complex (extensive) Discharge Coordination 0 PROCESS - Special Needs []  - Pediatric / Minor Patient Management 0 []  - Isolation Patient Management 0 []  - Hearing / Language / Visual special needs 0 []  - Assessment of Community assistance (transportation, D/C planning, etc.) 0 []  - Additional assistance / Altered mentation 0 []  - Support  Surface(s) Assessment (bed, cushion, seat, etc.) 0 INTERVENTIONS - Wound Cleansing / Measurement X - Simple Wound Cleansing - one wound 1 5 []  - Complex Wound Cleansing - multiple wounds  0 X - Wound Imaging (photographs - any number of wounds) 1 5 []  - Wound Tracing (instead of photographs) 0 X - Simple Wound Measurement - one wound 1 5 []  - Complex Wound Measurement - multiple wounds 0 INTERVENTIONS - Wound Dressings X - Small Wound Dressing one or multiple wounds 1 10 []  - Medium Wound Dressing one or multiple wounds 0 []  - Large Wound Dressing one or multiple wounds 0 X - Application of Medications - topical 1 5 []  - Application of Medications - injection 0 INTERVENTIONS - Miscellaneous []  - External ear exam 0 []  - Specimen Collection (cultures, biopsies, blood, body fluids, etc.) 0 []  - Specimen(s) / Culture(s) sent or taken to Lab for analysis 0 []  - Patient Transfer (multiple staff / Civil Service fast streamer / Similar devices) 0 []  - Simple Staple / Suture removal (25 or less) 0 []  - Complex Staple / Suture removal (26 or more) 0 []  - Hypo / Hyperglycemic Management (close monitor of Blood Glucose) 0 []  - Ankle / Brachial Index (ABI) - do not check if billed separately 0 X - Vital Signs 1 5 Has the patient been seen at the hospital within the last three years: Yes Total Score: 90 Level Of Care: New/Established - Level 3 Electronic Signature(s) Signed: 03/11/2020 5:07:08 PM By: Kela Millin Entered By: Kela Millin on 03/11/2020 15:37:54 -------------------------------------------------------------------------------- Encounter Discharge Information Details Patient Name: Heather Medicus K. Date of Service: 03/11/2020 2:45 PM Medical Record [id number] Patient Account Number: [id number] Date of Birth/Sex: Treating RN: 08-11-1971 (49 y.o. Clearnce Sorrel Primary Care Haywood Meinders: Inda Coke Other Clinician: Referring Willadean Guyton: Treating Tasmin Exantus/Extender:Robson,  Annice Needy, Calton Golds in Treatment: 3 Encounter Discharge Information Items Discharge Condition: Stable Ambulatory Status: Walker Discharge Destination: Home Transportation: Private Auto Accompanied By: self Schedule Follow-up Appointment: Yes Clinical Summary of Care: Patient Declined Electronic Signature(s) Signed: 03/11/2020 5:07:08 PM By: Kela Millin Entered By: Kela Millin on 03/11/2020 15:38:08 -------------------------------------------------------------------------------- Patient/Caregiver Education Details Heather [name], Heather [name] 3/16/2021andnbsp2:45 Patient Name: Date of Service: K. PM Medical Record Patient Account Number: [id number] Number: Treating RN: Kela Millin Date of Birth/Gender: September 06, 1971 (49 y.o. F) Other Clinician: Primary Care Treating Alease Frame Physician: Physician/Extender: Referring Physician: Gaspar Garbe in Treatment: 3 Education Assessment Education Provided To: Patient Education Topics Provided Elevated Blood Sugar/ Impact on Healing: Methods: Explain/Verbal Responses: State content correctly Nutrition: Methods: Explain/Verbal Responses: State content correctly Wound/Skin Impairment: Methods: Explain/Verbal Responses: State content correctly Electronic Signature(s) Signed: 03/11/2020 5:07:08 PM By: Kela Millin Entered By: Kela Millin on 03/11/2020 15:37:21 -------------------------------------------------------------------------------- Wound Assessment Details Patient Name: Heather Medicus K. Date of Service: 03/11/2020 2:45 PM Medical Record [id number] Patient Account Number: [id number] Date of Birth/Sex: Treating RN: 08-15-1971 (49 y.o. Clearnce Sorrel Primary Care Shaena Parkerson: Inda Coke Other Clinician: Referring Liba Hulsey: Treating [place]/Extender:Robson, Annice Needy, Calton Golds in Treatment: 3 Wound Status Wound Number: 4  Primary Diabetic Wound/Ulcer of the Lower Etiology: Extremity Wound Location: Left Foot - Lateral Wound Open Wounding Event: Gradually Appeared Status: Date Acquired: 12/28/2019 Comorbid Anemia, Hypertension, Type II Diabetes, Weeks Of Treatment: 3 History: Neuropathy Clustered Wound: No Wound Measurements Length: (cm) 0.7 % Reducti Width: (cm) 0.6 % Reducti Depth: (cm) 0.3 Epithelia Area: (cm) 0.33 Tunnelin Volume: (cm) 0.099 Undermin Wound Description Classification: Grade 2 Wound Margin: Flat and Intact Exudate Amount: Medium Exudate Type: Serosanguineous Exudate Color: red, brown Wound Bed Granulation Amount: Small (1-33%) Granulation Quality: Pink Necrotic Amount: Large (67-100%) Necrotic Quality: Adherent Slough Foul Odor After Cleansing: No Slough/Fibrino Yes Exposed  Structure Fascia Exposed: No Fat Layer (Subcutaneous Tissue) Exposed: Yes Tendon Exposed: No Muscle Exposed: No Joint Exposed: No Bone Exposed: No on in Area: 61.8% on in Volume: -15.1% lization: None g: No ing: No Treatment Notes Wound #4 (Left, Lateral Foot) 1. Cleanse With Wound Cleanser 2. Periwound Care Skin Prep 3. Primary Dressing Applied Calcium Alginate Ag 4. Secondary Dressing Dry Gauze Roll Gauze 5. Secured With Tape Notes netting. Electronic Signature(s) Signed: 03/11/2020 5:07:08 PM By: Kela Millin Entered By: Kela Millin on 03/11/2020 15:36:27 -------------------------------------------------------------------------------- Vitals Details Patient Name: Heather [name]. Date of Service: 03/11/2020 2:45 PM Medical Record [id number] Patient Account Number: [id number] Date of Birth/Sex: Treating RN: [birth year] (49 y.o. Clearnce Sorrel Primary Care Cyril Railey: Inda Coke Other Clinician: Referring Ocean Schildt: Treating Chaunice Obie/Extender:Robson, Annice Needy, Calton Golds in Treatment: 3 Vital Signs Time Taken: 15:35 Temperature (F):  98.3 Height (in): 69 Pulse (bpm): 94 Weight (lbs): 225 Respiratory Rate (breaths/min): 18 Body Mass Index (BMI): 33.2 Blood Pressure (mmHg): 174/89 Reference Range: 80 - 120 mg / dl Electronic Signature(s) Signed: 03/11/2020 5:07:08 PM By: Kela Millin Entered By: Kela Millin on 03/11/2020 15:36:05

## 2020-03-11 NOTE — Telephone Encounter (Signed)
I have tried to reach pt.  I was not able to leave a vm.  FMLA forms have been completed for spouse and faxed to [employer].  There is no charge for this.  I have sent a copy to scan.  I will hold onto a copy and will leave a copy up front if patient would like to pick up.

## 2020-03-17 ENCOUNTER — Other Ambulatory Visit (HOSPITAL_COMMUNITY)
Admission: RE | Admit: 2020-03-17 | Discharge: 2020-03-17 | Disposition: A | Discharge status: Home / Self Care | Payer: Managed Care, Other (non HMO) | Source: Ambulatory Visit | Attending: Vascular Surgery | Admitting: Vascular Surgery

## 2020-03-17 LAB — SARS CORONAVIRUS 2 (TAT 6-24 HRS): SARS Coronavirus 2: NEGATIVE

## 2020-03-18 ENCOUNTER — Other Ambulatory Visit: Payer: Self-pay

## 2020-03-18 ENCOUNTER — Encounter (HOSPITAL_COMMUNITY): Payer: Self-pay | Admitting: Vascular Surgery

## 2020-03-18 NOTE — Progress Notes (Signed)
Anesthesia Chart Review: Heather [name]   Case: [id number] Date/Time: 03/19/20 0715   Procedure: INSERTION OF ARTERIOVENOUS (AV) GORE-TEX GRAFT ARM (Right )   Anesthesia type: Monitor Anesthesia Care   Pre-op diagnosis: END STAGE RENAL DISEASE   Location: [place] OR ROOM 12 / [place] OR   Surgeons: Waynetta Sandy, MD      DISCUSSION: Patient is a 49 year old female scheduled for the above procedure. VVS notes indicates that she is currently undergoing hemodialysis via a tunneled dialysis catheter.   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   - Hospitalized at [facility] (see [place]) 11/02/19-11/10/19 for back pain, N/V, and fever. 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 She also had a small to moderate size pericardial effusion that was discussed with a cardiologist with no additional recommendations. She was started on IV heparin and transitioned to Eliquis for PE and antibiotics for bacteremia.  - Of note, seen by [facility] Cardiologist Pu, [place], MD o 06/14/19 for pericardial effusion which was felt stable since 2017--"likely in the setting of her being slightly hypervolemic on peritoneal dialysis". No pericardiocentesis recommended at that time.  Reported last Eliquis was on 03/13/20. COVID-19 test negative on  03/17/20.  [name] for labs and anesthesia evaluation on the day of surgery.   VS:   Wt Readings from Last 3 Encounters:  03/03/20 102.1 kg  02/11/20 101.6 kg  02/06/20 100.9 kg   BP Readings from Last 3 Encounters:  03/03/20 (!) 194/86  02/11/20 (!) 196/105  02/06/20 (!) 156/90   Pulse Readings from Last 3 Encounters:  03/03/20 82  02/11/20 100  02/06/20 85    PROVIDERS: Inda Coke, PA is PCP   Shamleffer, Elenora Gamma, MD is endocrinologist Cristopher Peru, MD is EP cardiologist (for loop recorder). Last noted [facility] Summary was from 11/28/18-01/01/19 and showed battery status OK, normal device function, no symptom episodes, and not tachy, brady, pause, or AF episodes.    LABS: For day of surgery. A1c 7.2% 12/26/19.    IMAGES: CTA Chest/abd/pelvis 12/05/19 [facility] CE): IMPRESSION: 1. Moderate pericardial effusion, slightly increased since prior studies. 2. Residual nonocclusive right lower lobe pulmonary emboli. No evidence of acute or superimposed PE. 3. Mild aortoiliac atherosclerosis (ICD10-170.0) without evidence of aortitis or other acute abnormality. 4. Atelectasis/consolidation in the medial right lower lobe and in basilar segments of the left lower lobe, increased since prior.  CXR 12/04/19 [facility] CE): FINDINGS: Cardiac shadow is stable. Right jugular dialysis catheter is again seen and stable. The lungs are well aerated without focal infiltrate or sizable effusion. Old rib fractures are again seen on the left. IMPRESSION: No acute abnormality noted.   EKG: 10/13/19: NSR   CV: Echo 11/07/19 [facility] CE): Findings Left Ventricle Normal left ventricular size and systolic function with no appreciable segmental abnormality. Ejection fraction is estimated at 60-65% Pericardial Effusion There is a circumferential pericardial effusion noted. - According to 11/02/19 Discharge Summary, "2D echo shows circumferential pericardial effusion. I have  personally  discussed with on-call cardiology Dr. Maryan Rued and he has reviewed the echo. As per him effusion is small and likely no significance. No further work-up recommended." - (Comparison: 07/04/19: LVEF 60-65%, small pericardial effusion; 03/19/19: LVEF 65-70%, small pericardial effusion; 02/08/19: LVEF 60-65%, small pericardial effusion; 12/05/17 TEE: No PFO or right-to-left atrial level shunt)   BLE Venous US 11/04/19 [facility] CE): Summary There is no evidence of DVT or superficial vein thrombophlebitis in the lower extremities bilaterally. Note: Isolated calf DVT may not be detected by this study.   Stress Echo 07/04/19 [facility] CE):  SUMMARY The patient had no chest pain during stress The patient achieved 87 % of maximum predicted heart rate. Normal left ventricular function and global wall motion with stress. Global LV function is preserved with stress. Negative stress ECG for inducible ischemia at target heart rate. Negative dobutamine echocardiography for inducible ischemia at target heart  rate.   Carotid US 06/14/19 [facility]): Results not viewable in Care Everywhere. Results from 12/13/12 showed:  Right Carotid: The extracranial vessels were near-normal with only minimal wall        thickening or plaque. Left Carotid: The extracranial vessels were near-normal with only minimal wall       thickening or plaque. Vertebrals: Both vertebral arteries were patent with antegrade flow.   Past Medical History:  Diagnosis Date  . Antiplatelet or antithrombotic long-term use: Plavix 06/30/2017  . B12 deficiency 05/10/2014  . Blood transfusion without reported diagnosis   . Chronic constipation   . CVA (cerebral vascular accident) ([place]), nonhemorrhagic, inferior right cerebellum 12/03/2017   left side weakness in leg  . Cyst of left ovary 01/12/2018  . Diabetic neuropathy, painful ([place]), on low dose Gabapentin 07/13/2013  . DM (diabetes mellitus), type 2, uncontrolled, with renal  complications ([place]) 16/12/930  . DM2 (diabetes mellitus, type 2) ([place])   . Dysfunction of left eustachian tube, with pusatile tinnitus 11/24/2017  . Dyslipidemia associated with type 2 diabetes mellitus ([place]) 06/25/2016  . ESRD (end stage renal disease) on dialysis [facility])    On hemodialysis in July 2019 via [facility] then switched to [facility] in Nov 2019.   [name] ESRD with anemia ([place])   . Fibromyalgia   . Gastroparesis due to DM   . GERD (gastroesophageal reflux disease)   . Headache   . Hypertension associated with diabetes ([place]) 02/19/2011  . IBS (irritable bowel syndrome)   . Left-sided weakness 12/10/2017   .  [name] Leiomyoma of uterus   . Pancreatitis   . PE (pulmonary thromboembolism) ([place]) 11/04/2019  . Pneumonia   . Proliferative diabetic retinopathy ([place]) 09/29/2015  . 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 Reactive depression 12/10/2017   .  [name] Right-sided low back pain without sciatica 12/08/2015  . Secondary hyperthyroidism 11/27/2013  . Steal syndrome dialysis vascular access ([place]) 02/17/2018  . Thromboembolism ([place]) 04/05/2018  . Upper airway cough syndrome, with recs to stay off ACE and take Pepcid q hs 11/15/2016    Past Surgical History:  Procedure Laterality Date  . A/V FISTULAGRAM Right 03/03/2020   Procedure: Venogram;  Surgeon: Waynetta Sandy, MD;  Location: [place] CV LAB;  Service: Cardiovascular;  Laterality: Right;  . ARTERY REPAIR Right 02/17/2018   Procedure: EXPLORATION OF RIGHT BRACHIAL ARTERY;  Surgeon: Conrad [name], MD;  Location: [place];  Service: Vascular;  Laterality: Right;  . ARTERY REPAIR Right 02/17/2018   Procedure: BRACHIAL ARTERY EXPLORATION AND TRHOMBECTOMY;  Surgeon: Conrad [place], MD;  Location: [facility] OR;  Service: Vascular;  Laterality: Right;  . AV FISTULA PLACEMENT Left 05/09/2017   Procedure: INSERTION OF ARTERIOVENOUS (AV) GRAFT ARM (ARTEGRAFT);  Surgeon: Conrad Rector, MD;  Location[facility] OR;  Service: Vascular;  Laterality: Left;  . AV  FISTULA PLACEMENT Right 02/17/2018   Procedure: INSERTION OF ARTERIOVENOUS (AV) GORE-TEX GRAFT ARM RIGHT UPPER ARM;  Surgeon: Conrad [place], MD;  Location: [place];  Service: Vascular;  Laterality: Right;  . AV FISTULA PLACEMENT Left 10/10/2019   Procedure: INSERTION OF LEFT ARTERIOVENOUS (AV) ARTEGRAFT GRAFT ARM;  Surgeon: Waynetta Sandy, MD;  Location: [place];  Service: Vascular;  Laterality: Left;  . BASCILIC VEIN TRANSPOSITION Left 08/31/2016   Procedure: BASILIC VEIN TRANSPOSITION FIRST STAGE;  Surgeon: Conrad [place], MD;  Location: [place];  Service: Vascular;  Laterality: Left;  . CENTRAL VENOUS CATHETER INSERTION Left 07/24/2018   Procedure: INSERTION CENTRAL LINE ADULT;  Surgeon: Conrad Woodway, MD;  Location: [place];  Service: Vascular;  Laterality: Left;  . EYE SURGERY     secondary to diabetic retinopathy   . FOOT SURGERY Right    t"ook bone out- maybe hammer toe"  . INSERTION OF DIALYSIS CATHETER Left 07/24/2018   Procedure: INSERTION OF TUNNELED DIALYSIS CATHETER;  Surgeon: Conrad [place], MD;  Location: [place];  Service: Vascular;  Laterality: Left;  . IR FLUORO GUIDE CV LINE RIGHT  07/21/2018  . IR FLUORO GUIDE CV LINE RIGHT  06/01/2019  . IR US GUIDE VASC ACCESS RIGHT  07/21/2018  . IR US GUIDE VASC ACCESS RIGHT  06/01/2019  . LIGATION OF ARTERIOVENOUS  FISTULA Left 05/09/2017   Procedure: LIGATION OF ARTERIOVENOUS  FISTULA;  Surgeon: Conrad [place], MD;  Location: [place];  Service: Vascular;  Laterality: Left;  . LOOP RECORDER INSERTION N/A 12/05/2017   Procedure: LOOP RECORDER INSERTION;  Surgeon: Evans Lance, MD;  Location: [place] CV LAB;  Service: Cardiovascular;  Laterality: N/A;  . MYOMECTOMY    . REMOVAL OF GRAFT Right 02/17/2018   Procedure: REMOVAL OF RIGHT UPPER ARM ARTERIOVENOUS GRAFT;  Surgeon: Conrad Patrick, MD;  Location: [place];  Service: Vascular;  Laterality: Right;  . REVISION OF ARTERIOVENOUS GORETEX GRAFT Left 07/24/2018   Procedure: REDO ARTERIOVENOUS GORETEX  GRAFT;  Surgeon: Conrad , MD;  Location: [place];  Service: Vascular;  Laterality: Left;  . TEE WITHOUT CARDIOVERSION N/A 12/05/2017   Procedure: TRANSESOPHAGEAL ECHOCARDIOGRAM (TEE);  Surgeon: Sanda Klein, MD;  Location: [facility] ENDOSCOPY;  Service: Cardiovascular;  Laterality: N/A;  . UPPER EXTREMITY VENOGRAPHY Left 07/13/2018   Procedure: UPPER EXTREMITY VENOGRAPHY - Central & Left Arm;  Surgeon: Conrad , MD;  Location: [place] CV LAB;  Service: Cardiovascular;  Laterality: Left;  . UPPER EXTREMITY VENOGRAPHY Bilateral 09/10/2019   Procedure: UPPER EXTREMITY VENOGRAPHY;  Surgeon: Waynetta Sandy, MD;  Location: [place] CV LAB;  Service: Cardiovascular;  Laterality: Bilateral;  . UTERINE FIBROID SURGERY    . VITRECTOMY Bilateral     MEDICATIONS: No current facility-administered medications for this encounter.   [name] amLODipine (NORVASC) 10 MG tablet  . aspirin-acetaminophen-caffeine (EXCEDRIN MIGRAINE) 250-250-65 MG tablet  . ELIQUIS 5 MG TABS tablet  . gabapentin (NEURONTIN) 100 MG capsule  . gabapentin (NEURONTIN) 300 MG capsule  . insulin aspart (NOVOLOG FLEXPEN) 100 UNIT/ML FlexPen  . insulin degludec (TRESIBA FLEXTOUCH) 100 UNIT/ML SOPN FlexTouch Pen  . lisinopril (ZESTRIL) 10 MG tablet  . lubiprostone (AMITIZA) 24 MCG capsule  .  midodrine (PROAMATINE) 5 MG tablet  . Continuous Blood Gluc Sensor (FREESTYLE LIBRE 14 DAY SENSOR) MISC  . Insulin Pen Needle 29G X 5MM MISC    Myra Gianotti, PA-C Surgical Short Stay/Anesthesiology [facility] Phone [phone] [facility] Phone [phone] 03/18/2020 1:11 PM

## 2020-03-18 NOTE — Anesthesia Preprocedure Evaluation (Addendum)
Anesthesia Evaluation  Patient identified by MRN, date of birth, ID band Patient awake    Reviewed: Allergy & Precautions, NPO status , Patient's Chart, lab work & pertinent test results  Airway Mallampati: I  TM Distance: >3 FB Neck ROM: Full    Dental  (+) Missing, Dental Advisory Given,    Pulmonary neg pulmonary ROS,    Pulmonary exam normal breath sounds clear to auscultation       Cardiovascular hypertension, Pt. on medications Normal cardiovascular exam Rhythm:Regular Rate:Normal     Neuro/Psych  Headaches, Depression CVA    GI/Hepatic GERD  ,  Endo/Other  diabetes, Poorly Controlled, Type 2  Renal/GU ESRF and DialysisRenal diseaseK+ 4.6 Cr 8.90     Musculoskeletal  (+) Fibromyalgia -  Abdominal   Peds  Hematology  (+) anemia ,   Anesthesia Other Findings   Reproductive/Obstetrics                          Anesthesia Physical Anesthesia Plan  ASA: IV  Anesthesia Plan: MAC   Post-op Pain Management:    Induction: Intravenous  PONV Risk Score and Plan: Treatment may vary due to age or medical condition  Airway Management Planned: Simple Face Mask  Additional Equipment: None  Intra-op Plan:   Post-operative Plan:   Informed Consent: I have reviewed the patients History and Physical, chart, labs and discussed the procedure including the risks, benefits and alternatives for the proposed anesthesia with the patient or authorized representative who has indicated his/her understanding and acceptance.     Dental advisory given  Plan Discussed with:   Anesthesia Plan Comments: (PAT note written 03/18/2020 by Myra Gianotti, PA-C.  Echo 11/07/19 [facility] CE): Findings Left Ventricle Normal left ventricular size and systolic function with no appreciable segmental abnormality. Ejection fraction is estimated at 60-65% Pericardial Effusion There is a circumferential  pericardial effusion noted. - According to 11/02/19 Discharge Summary, "2D echo shows circumferential pericardial effusion. I have personally discussed with on-call cardiology Dr. Maryan Rued and he has reviewed the echo. As per him effusion is small and likely no significance. No further work-up recommended." - (Comparison: 07/04/19: LVEF 60-65%, small pericardial effusion; 03/19/19: LVEF 65-70%, small pericardial effusion; 02/08/19: LVEF 60-65%, small pericardial effusion; 12/05/17 TEE: No PFO or right-to-left atrial level shunt)  Stress Echo 07/04/19 [facility] CE):  SUMMARY The patient had no chest pain during stress The patient achieved 87 % of maximum predicted heart rate. Normal left ventricular function and global wall motion with stress. Global LV function is preserved with stress. Negative stress ECG for inducible ischemia at target heart rate. Negative dobutamine echocardiography for inducible ischemia at target heart  rate.)      Anesthesia Quick Evaluation

## 2020-03-18 NOTE — Progress Notes (Signed)
Pt denies any acute cardiopulmonary issues. Pt under the care of Dr. Lovena Le, Cardiology. Pt denies having a cardiac cath. Nurse requested EKG tracing from [facility]; awaiting response. Pt stated that last dose of Eliquis was " Thursday. " Pt made aware to stop taking vitamins, fish oil and herbal medications. Do not take any NSAIDs ie: Excedrin Migraine, Ibuprofen, Advil, Naproxen (Aleve), Motrin, BC and Goody Powder.Pt made aware to take 50% (14 units) of Tresiba insulin at HS. Pt made aware to check BG every 2 hours prior to arrival to hospital on DOS. Pt made aware to treat a BG < 70 with 4 ounces of apple juice, wait 15 minutes after intervention to recheck BG, if BG remains < 70, call Short Stay unit to speak with a nurse.Pt made aware to take 1/2 dose of correction Novolog insulin for BG > 220. Pt reminded to quarantine. Pt verbalized understanding of all pre-op instructions. PA, Anesthesiology, asked to review pt history.

## 2020-03-19 ENCOUNTER — Ambulatory Visit (HOSPITAL_COMMUNITY): Payer: Managed Care, Other (non HMO) | Admitting: Vascular Surgery

## 2020-03-19 ENCOUNTER — Ambulatory Visit (HOSPITAL_COMMUNITY): Payer: Managed Care, Other (non HMO)

## 2020-03-19 ENCOUNTER — Ambulatory Visit: Payer: Managed Care, Other (non HMO) | Admitting: Internal Medicine

## 2020-03-19 ENCOUNTER — Encounter (HOSPITAL_COMMUNITY): Payer: Self-pay | Admitting: Vascular Surgery

## 2020-03-19 ENCOUNTER — Other Ambulatory Visit: Payer: Self-pay

## 2020-03-19 ENCOUNTER — Encounter (HOSPITAL_COMMUNITY)
Admission: AD | Disposition: A | Discharge status: Home / Self Care | Payer: Self-pay | Source: Home / Self Care | Attending: Vascular Surgery

## 2020-03-19 ENCOUNTER — Inpatient Hospital Stay (HOSPITAL_COMMUNITY)
Admission: AD | Admit: 2020-03-19 | Discharge: 2020-03-21 | DRG: 252 | Disposition: A | Discharge status: Home / Self Care | Payer: Managed Care, Other (non HMO) | Attending: Vascular Surgery | Admitting: Vascular Surgery

## 2020-03-19 DIAGNOSIS — E1122 Type 2 diabetes mellitus with diabetic chronic kidney disease: Secondary | ICD-10-CM | POA: Diagnosis present

## 2020-03-19 DIAGNOSIS — R531 Weakness: Secondary | ICD-10-CM | POA: Diagnosis present

## 2020-03-19 DIAGNOSIS — Z419 Encounter for procedure for purposes other than remedying health state, unspecified: Secondary | ICD-10-CM

## 2020-03-19 DIAGNOSIS — I120 Hypertensive chronic kidney disease with stage 5 chronic kidney disease or end stage renal disease: Secondary | ICD-10-CM | POA: Diagnosis present

## 2020-03-19 DIAGNOSIS — T82898A Other specified complication of vascular prosthetic devices, implants and grafts, initial encounter: Secondary | ICD-10-CM | POA: Diagnosis not present

## 2020-03-19 DIAGNOSIS — L89899 Pressure ulcer of other site, unspecified stage: Secondary | ICD-10-CM | POA: Diagnosis present

## 2020-03-19 DIAGNOSIS — Z992 Dependence on renal dialysis: Secondary | ICD-10-CM

## 2020-03-19 DIAGNOSIS — Z794 Long term (current) use of insulin: Secondary | ICD-10-CM

## 2020-03-19 DIAGNOSIS — X58XXXA Exposure to other specified factors, initial encounter: Secondary | ICD-10-CM | POA: Diagnosis present

## 2020-03-19 DIAGNOSIS — N186 End stage renal disease: Secondary | ICD-10-CM | POA: Diagnosis present

## 2020-03-19 DIAGNOSIS — M797 Fibromyalgia: Secondary | ICD-10-CM | POA: Diagnosis present

## 2020-03-19 DIAGNOSIS — Z79899 Other long term (current) drug therapy: Secondary | ICD-10-CM

## 2020-03-19 DIAGNOSIS — E1169 Type 2 diabetes mellitus with other specified complication: Secondary | ICD-10-CM | POA: Diagnosis present

## 2020-03-19 DIAGNOSIS — T82868A Thrombosis of vascular prosthetic devices, implants and grafts, initial encounter: Principal | ICD-10-CM | POA: Diagnosis present

## 2020-03-19 DIAGNOSIS — D631 Anemia in chronic kidney disease: Secondary | ICD-10-CM | POA: Diagnosis present

## 2020-03-19 DIAGNOSIS — Z8673 Personal history of transient ischemic attack (TIA), and cerebral infarction without residual deficits: Secondary | ICD-10-CM

## 2020-03-19 DIAGNOSIS — Z20822 Contact with and (suspected) exposure to covid-19: Secondary | ICD-10-CM | POA: Diagnosis present

## 2020-03-19 DIAGNOSIS — E113599 Type 2 diabetes mellitus with proliferative diabetic retinopathy without macular edema, unspecified eye: Secondary | ICD-10-CM | POA: Diagnosis present

## 2020-03-19 DIAGNOSIS — Z7901 Long term (current) use of anticoagulants: Secondary | ICD-10-CM

## 2020-03-19 DIAGNOSIS — Z7982 Long term (current) use of aspirin: Secondary | ICD-10-CM

## 2020-03-19 DIAGNOSIS — E785 Hyperlipidemia, unspecified: Secondary | ICD-10-CM | POA: Diagnosis present

## 2020-03-19 HISTORY — PX: AV FISTULA PLACEMENT: SHX1204

## 2020-03-19 LAB — POCT I-STAT, CHEM 8
BUN: 33 mg/dL — ABNORMAL HIGH (ref 6–20)
Calcium, Ion: 1.23 mmol/L (ref 1.15–1.40)
Chloride: 100 mmol/L (ref 98–111)
Creatinine, Ser: 8.9 mg/dL — ABNORMAL HIGH (ref 0.44–1.00)
Glucose, Bld: 274 mg/dL — ABNORMAL HIGH (ref 70–99)
HCT: 35 % — ABNORMAL LOW (ref 36.0–46.0)
Hemoglobin: 11.9 g/dL — ABNORMAL LOW (ref 12.0–15.0)
Potassium: 4.6 mmol/L (ref 3.5–5.1)
Sodium: 136 mmol/L (ref 135–145)
TCO2: 30 mmol/L (ref 22–32)

## 2020-03-19 LAB — GLUCOSE, CAPILLARY
Glucose-Capillary: 305 mg/dL — ABNORMAL HIGH (ref 70–99)
Glucose-Capillary: 254 mg/dL — ABNORMAL HIGH (ref 70–99)
Glucose-Capillary: 182 mg/dL — ABNORMAL HIGH (ref 70–99)
Glucose-Capillary: 186 mg/dL — ABNORMAL HIGH (ref 70–99)
Glucose-Capillary: 199 mg/dL — ABNORMAL HIGH (ref 70–99)
Glucose-Capillary: 297 mg/dL — ABNORMAL HIGH (ref 70–99)

## 2020-03-19 LAB — CBC
HCT: 38.7 % (ref 36.0–46.0)
Hemoglobin: 11.7 g/dL — ABNORMAL LOW (ref 12.0–15.0)
MCH: 29.1 pg (ref 26.0–34.0)
MCHC: 30.2 g/dL (ref 30.0–36.0)
MCV: 96.3 fL (ref 80.0–100.0)
Platelets: 164 10*3/uL (ref 150–400)
RBC: 4.02 MIL/uL (ref 3.87–5.11)
RDW: 15.7 % — ABNORMAL HIGH (ref 11.5–15.5)
WBC: 12.5 10*3/uL — ABNORMAL HIGH (ref 4.0–10.5)
nRBC: 0 % (ref 0.0–0.2)

## 2020-03-19 LAB — CREATININE, SERUM
Creatinine, Ser: 8.8 mg/dL — ABNORMAL HIGH (ref 0.44–1.00)
GFR calc Af Amer: 5 mL/min — ABNORMAL LOW (ref >60)
GFR calc non Af Amer: 5 mL/min — ABNORMAL LOW (ref >60)

## 2020-03-19 LAB — POCT PREGNANCY, URINE: Preg Test, Ur: NEGATIVE

## 2020-03-19 LAB — PROTIME-INR
INR: 1 (ref 0.8–1.2)
Prothrombin Time: 13 seconds (ref 11.4–15.2)

## 2020-03-19 SURGERY — INSERTION OF ARTERIOVENOUS (AV) GORE-TEX GRAFT ARM
Anesthesia: Monitor Anesthesia Care | Laterality: Right

## 2020-03-19 MED ORDER — ACETAMINOPHEN 650 MG RE SUPP: 325.0000 mg | RECTAL | Status: DC | PRN

## 2020-03-19 MED ORDER — PHENOL 1.4 % MT LIQD: 1.0000 | OROMUCOSAL | Status: DC | PRN

## 2020-03-19 MED ORDER — ACETAMINOPHEN 10 MG/ML IV SOLN
INTRAVENOUS | Status: AC
  Filled 2020-03-19: qty 100

## 2020-03-19 MED ORDER — CHLORHEXIDINE GLUCONATE CLOTH 2 % EX PADS
6.0000 | MEDICATED_PAD | Freq: Every day | CUTANEOUS | Status: DC
  Administered 2020-03-20: 6 via TOPICAL

## 2020-03-19 MED ORDER — OXYCODONE HCL 5 MG PO TABS
ORAL_TABLET | ORAL | Status: AC
  Filled 2020-03-19: qty 2

## 2020-03-19 MED ORDER — ONDANSETRON HCL 4 MG/2ML IJ SOLN
4.0000 mg | Freq: Four times a day (QID) | INTRAMUSCULAR | Status: DC | PRN
  Administered 2020-03-19 – 2020-03-20 (×2): 4 mg via INTRAVENOUS
  Filled 2020-03-19: qty 2

## 2020-03-19 MED ORDER — MIDAZOLAM HCL 5 MG/5ML IJ SOLN
INTRAMUSCULAR | Status: DC | PRN
  Administered 2020-03-19: 2 mg via INTRAVENOUS

## 2020-03-19 MED ORDER — SODIUM CHLORIDE 0.9% FLUSH: 3.0000 mL | INTRAVENOUS | Status: DC | PRN

## 2020-03-19 MED ORDER — LIDOCAINE-EPINEPHRINE 1 %-1:100000 IJ SOLN
INTRAMUSCULAR | Status: AC
  Filled 2020-03-19: qty 1

## 2020-03-19 MED ORDER — MIDODRINE HCL 5 MG PO TABS
5.0000 mg | ORAL_TABLET | Freq: Two times a day (BID) | ORAL | Status: DC
  Administered 2020-03-20: 5 mg via ORAL
  Filled 2020-03-19 (×2): qty 1

## 2020-03-19 MED ORDER — FENTANYL CITRATE (PF) 100 MCG/2ML IJ SOLN
INTRAMUSCULAR | Status: AC
  Filled 2020-03-19: qty 2

## 2020-03-19 MED ORDER — OXYCODONE HCL 5 MG PO TABS
5.0000 mg | ORAL_TABLET | ORAL | Status: DC | PRN
  Administered 2020-03-19: 10 mg via ORAL

## 2020-03-19 MED ORDER — LIDOCAINE-EPINEPHRINE (PF) 1 %-1:200000 IJ SOLN
INTRAMUSCULAR | Status: DC | PRN
  Administered 2020-03-19: 20 mL

## 2020-03-19 MED ORDER — HYDRALAZINE HCL 20 MG/ML IJ SOLN: 5.0000 mg | INTRAMUSCULAR | Status: DC | PRN

## 2020-03-19 MED ORDER — PROPOFOL 10 MG/ML IV BOLUS
INTRAVENOUS | Status: AC
  Filled 2020-03-19: qty 20

## 2020-03-19 MED ORDER — INSULIN ASPART 100 UNIT/ML ~~LOC~~ SOLN
0.0000 [IU] | Freq: Every day | SUBCUTANEOUS | Status: DC
  Administered 2020-03-19: 3 [IU] via SUBCUTANEOUS

## 2020-03-19 MED ORDER — CHLORHEXIDINE GLUCONATE 4 % EX LIQD: 60.0000 mL | Freq: Once | CUTANEOUS | Status: DC

## 2020-03-19 MED ORDER — SODIUM CHLORIDE 0.9 % IV SOLN
INTRAVENOUS | Status: AC
  Filled 2020-03-19: qty 1.2

## 2020-03-19 MED ORDER — 0.9 % SODIUM CHLORIDE (POUR BTL) OPTIME
TOPICAL | Status: DC | PRN
  Administered 2020-03-19: 1000 mL

## 2020-03-19 MED ORDER — FENTANYL CITRATE (PF) 250 MCG/5ML IJ SOLN
INTRAMUSCULAR | Status: AC
  Filled 2020-03-19: qty 5

## 2020-03-19 MED ORDER — GUAIFENESIN-DM 100-10 MG/5ML PO SYRP: 15.0000 mL | ORAL_SOLUTION | ORAL | Status: DC | PRN

## 2020-03-19 MED ORDER — LUBIPROSTONE 24 MCG PO CAPS
24.0000 ug | ORAL_CAPSULE | Freq: Two times a day (BID) | ORAL | Status: DC
  Filled 2020-03-19 (×2): qty 1

## 2020-03-19 MED ORDER — SODIUM CHLORIDE 0.9% FLUSH: 3.0000 mL | Freq: Two times a day (BID) | INTRAVENOUS | Status: DC

## 2020-03-19 MED ORDER — DOCUSATE SODIUM 100 MG PO CAPS
100.0000 mg | ORAL_CAPSULE | Freq: Two times a day (BID) | ORAL | Status: DC
  Filled 2020-03-19 (×4): qty 1

## 2020-03-19 MED ORDER — INSULIN ASPART 100 UNIT/ML ~~LOC~~ SOLN: 6.0000 [IU] | SUBCUTANEOUS | Status: AC

## 2020-03-19 MED ORDER — INSULIN ASPART 100 UNIT/ML ~~LOC~~ SOLN
SUBCUTANEOUS | Status: AC
  Administered 2020-03-19: 6 [IU] via SUBCUTANEOUS
  Filled 2020-03-19: qty 1

## 2020-03-19 MED ORDER — DIPHENHYDRAMINE HCL 25 MG PO CAPS
25.0000 mg | ORAL_CAPSULE | Freq: Four times a day (QID) | ORAL | Status: DC | PRN
  Administered 2020-03-19 – 2020-03-20 (×2): 25 mg via ORAL
  Filled 2020-03-19: qty 1

## 2020-03-19 MED ORDER — SODIUM CHLORIDE 0.9 % IV SOLN: INTRAVENOUS | Status: DC

## 2020-03-19 MED ORDER — PROPOFOL 500 MG/50ML IV EMUL
INTRAVENOUS | Status: DC | PRN
  Administered 2020-03-19: 50 ug/kg/min via INTRAVENOUS

## 2020-03-19 MED ORDER — PHENYLEPHRINE 40 MCG/ML (10ML) SYRINGE FOR IV PUSH (FOR BLOOD PRESSURE SUPPORT)
PREFILLED_SYRINGE | INTRAVENOUS | Status: DC | PRN
  Administered 2020-03-19 (×4): 80 ug via INTRAVENOUS

## 2020-03-19 MED ORDER — FENTANYL CITRATE (PF) 250 MCG/5ML IJ SOLN
INTRAMUSCULAR | Status: DC | PRN
  Administered 2020-03-19: 25 ug via INTRAVENOUS
  Administered 2020-03-19: 50 ug via INTRAVENOUS
  Administered 2020-03-19: 25 ug via INTRAVENOUS

## 2020-03-19 MED ORDER — ONDANSETRON HCL 4 MG/2ML IJ SOLN
INTRAMUSCULAR | Status: DC | PRN
  Administered 2020-03-19: 4 mg via INTRAVENOUS

## 2020-03-19 MED ORDER — AMLODIPINE BESYLATE 10 MG PO TABS
10.0000 mg | ORAL_TABLET | Freq: Every day | ORAL | Status: DC
  Administered 2020-03-19 – 2020-03-20 (×2): 10 mg via ORAL
  Filled 2020-03-19 (×2): qty 1

## 2020-03-19 MED ORDER — METOPROLOL TARTRATE 5 MG/5ML IV SOLN: 2.0000 mg | INTRAVENOUS | Status: DC | PRN

## 2020-03-19 MED ORDER — HEPARIN SODIUM (PORCINE) 5000 UNIT/ML IJ SOLN
5000.0000 [IU] | Freq: Three times a day (TID) | INTRAMUSCULAR | Status: DC
  Administered 2020-03-20 – 2020-03-21 (×4): 5000 [IU] via SUBCUTANEOUS
  Filled 2020-03-19 (×3): qty 1

## 2020-03-19 MED ORDER — LABETALOL HCL 5 MG/ML IV SOLN: 10.0000 mg | INTRAVENOUS | Status: DC | PRN

## 2020-03-19 MED ORDER — FENTANYL CITRATE (PF) 100 MCG/2ML IJ SOLN
25.0000 ug | INTRAMUSCULAR | Status: DC | PRN
  Administered 2020-03-19: 50 ug via INTRAVENOUS

## 2020-03-19 MED ORDER — MORPHINE SULFATE (PF) 2 MG/ML IV SOLN
INTRAVENOUS | Status: AC
  Filled 2020-03-19: qty 1

## 2020-03-19 MED ORDER — OXYCODONE-ACETAMINOPHEN 7.5-325 MG PO TABS: 1.0000 | ORAL_TABLET | ORAL | 0 refills | Status: DC | PRN

## 2020-03-19 MED ORDER — LISINOPRIL 10 MG PO TABS
10.0000 mg | ORAL_TABLET | Freq: Every day | ORAL | Status: DC
  Administered 2020-03-19: 10 mg via ORAL
  Filled 2020-03-19 (×2): qty 1

## 2020-03-19 MED ORDER — MORPHINE SULFATE (PF) 2 MG/ML IV SOLN
2.0000 mg | INTRAVENOUS | Status: DC | PRN
  Administered 2020-03-19: 4 mg via INTRAVENOUS
  Administered 2020-03-19: 5 mg via INTRAVENOUS
  Administered 2020-03-19 – 2020-03-20 (×2): 2 mg via INTRAVENOUS
  Filled 2020-03-19: qty 2
  Filled 2020-03-19: qty 3
  Filled 2020-03-19: qty 1

## 2020-03-19 MED ORDER — INSULIN ASPART 100 UNIT/ML ~~LOC~~ SOLN
6.0000 [IU] | Freq: Three times a day (TID) | SUBCUTANEOUS | Status: DC
  Administered 2020-03-21: 6 [IU] via SUBCUTANEOUS

## 2020-03-19 MED ORDER — INSULIN ASPART 100 UNIT/ML ~~LOC~~ SOLN
0.0000 [IU] | Freq: Three times a day (TID) | SUBCUTANEOUS | Status: DC
  Administered 2020-03-19: 1 [IU] via SUBCUTANEOUS
  Administered 2020-03-20: 3 [IU] via SUBCUTANEOUS
  Administered 2020-03-21: 1 [IU] via SUBCUTANEOUS

## 2020-03-19 MED ORDER — ONDANSETRON HCL 4 MG/2ML IJ SOLN: 4.0000 mg | Freq: Once | INTRAMUSCULAR | Status: DC | PRN

## 2020-03-19 MED ORDER — ACETAMINOPHEN 325 MG PO TABS: 325.0000 mg | ORAL_TABLET | ORAL | Status: DC | PRN

## 2020-03-19 MED ORDER — SODIUM CHLORIDE 0.9 % IV SOLN: 250.0000 mL | INTRAVENOUS | Status: DC | PRN

## 2020-03-19 MED ORDER — ACETAMINOPHEN 10 MG/ML IV SOLN
1000.0000 mg | Freq: Once | INTRAVENOUS | Status: DC | PRN
  Administered 2020-03-19: 1000 mg via INTRAVENOUS

## 2020-03-19 MED ORDER — CEFAZOLIN SODIUM-DEXTROSE 2-4 GM/100ML-% IV SOLN
2.0000 g | INTRAVENOUS | Status: AC
  Administered 2020-03-19: 2 g via INTRAVENOUS
  Filled 2020-03-19: qty 100

## 2020-03-19 MED ORDER — SODIUM CHLORIDE 0.9 % IV SOLN
INTRAVENOUS | Status: DC | PRN
  Administered 2020-03-19: 500 mL

## 2020-03-19 MED ORDER — MIDAZOLAM HCL 2 MG/2ML IJ SOLN
INTRAMUSCULAR | Status: AC
  Filled 2020-03-19: qty 2

## 2020-03-19 MED ORDER — PANTOPRAZOLE SODIUM 40 MG PO TBEC
40.0000 mg | DELAYED_RELEASE_TABLET | Freq: Every day | ORAL | Status: DC
  Filled 2020-03-19: qty 1

## 2020-03-19 MED ORDER — ALUM & MAG HYDROXIDE-SIMETH 200-200-20 MG/5ML PO SUSP: 15.0000 mL | ORAL | Status: DC | PRN

## 2020-03-19 MED ORDER — GABAPENTIN 300 MG PO CAPS: 300.0000 mg | ORAL_CAPSULE | Freq: Two times a day (BID) | ORAL | Status: DC | PRN

## 2020-03-19 MED ORDER — POTASSIUM CHLORIDE CRYS ER 20 MEQ PO TBCR: 20.0000 meq | EXTENDED_RELEASE_TABLET | Freq: Once | ORAL | Status: DC

## 2020-03-19 MED ORDER — IOHEXOL 300 MG/ML  SOLN
INTRAMUSCULAR | Status: DC | PRN
  Administered 2020-03-19: 65 mL

## 2020-03-19 MED ORDER — LIDOCAINE-EPINEPHRINE 0.5 %-1:200000 IJ SOLN
INTRAMUSCULAR | Status: AC
  Filled 2020-03-19: qty 1

## 2020-03-19 SURGICAL SUPPLY — 49 items
ARMBAND PINK RESTRICT EXTREMIT (MISCELLANEOUS) ×3 IMPLANT
BALLN MUSTANG 7X80X75 (BALLOONS) ×2
BALLOON MUSTANG 7X80X75 (BALLOONS) IMPLANT
CANISTER SUCT 3000ML PPV (MISCELLANEOUS) ×2 IMPLANT
CATH EMB 3FR 80CM (CATHETERS) ×1 IMPLANT
CLIP VESOCCLUDE MED 6/CT (CLIP) ×2 IMPLANT
CLIP VESOCCLUDE SM WIDE 6/CT (CLIP) ×2 IMPLANT
DERMABOND ADVANCED (GAUZE/BANDAGES/DRESSINGS) ×2
DERMABOND ADVANCED .7 DNX12 (GAUZE/BANDAGES/DRESSINGS) ×1 IMPLANT
DRAPE C-ARM 42X72 X-RAY (DRAPES) ×1 IMPLANT
ELECT REM PT RETURN 9FT ADLT (ELECTROSURGICAL) ×2
ELECTRODE REM PT RTRN 9FT ADLT (ELECTROSURGICAL) ×1 IMPLANT
GLOVE BIO SURGEON STRL SZ 6.5 (GLOVE) ×1 IMPLANT
GLOVE BIO SURGEON STRL SZ7 (GLOVE) ×2 IMPLANT
GLOVE BIO SURGEON STRL SZ7.5 (GLOVE) ×2 IMPLANT
GLOVE BIOGEL PI IND STRL 7.0 (GLOVE) IMPLANT
GLOVE BIOGEL PI INDICATOR 7.0 (GLOVE) ×4
GLOVE SURG SS PI 6.5 STRL IVOR (GLOVE) ×1 IMPLANT
GOWN STRL REUS W/ TWL LRG LVL3 (GOWN DISPOSABLE) ×2 IMPLANT
GOWN STRL REUS W/ TWL XL LVL3 (GOWN DISPOSABLE) ×1 IMPLANT
GOWN STRL REUS W/TWL LRG LVL3 (GOWN DISPOSABLE) ×8
GOWN STRL REUS W/TWL XL LVL3 (GOWN DISPOSABLE) ×2
GRAFT GORETEX STND 4X7 (Vascular Products) ×2 IMPLANT
GRAFT GORETEXSTD 4X7 (Vascular Products) IMPLANT
HEMOSTAT SNOW SURGICEL 2X4 (HEMOSTASIS) IMPLANT
INSERT FOGARTY SM (MISCELLANEOUS) ×2 IMPLANT
KIT BASIN OR (CUSTOM PROCEDURE TRAY) ×2 IMPLANT
KIT ENCORE 26 ADVANTAGE (KITS) ×1 IMPLANT
KIT TURNOVER KIT B (KITS) ×2 IMPLANT
NS IRRIG 1000ML POUR BTL (IV SOLUTION) ×2 IMPLANT
PACK CV ACCESS (CUSTOM PROCEDURE TRAY) ×2 IMPLANT
PAD ARMBOARD 7.5X6 YLW CONV (MISCELLANEOUS) ×4 IMPLANT
SET MICROPUNCTURE 5F STIFF (MISCELLANEOUS) ×1 IMPLANT
SHEATH PINNACLE 8F 10CM (SHEATH) ×1 IMPLANT
STENT VIABAHN 10X120X8X (Permanent Stent) IMPLANT
STENT VIABAHN 8X100X120 (Permanent Stent) ×2 IMPLANT
STOPCOCK 4 WAY LG BORE MALE ST (IV SETS) ×1 IMPLANT
SUT MNCRL AB 4-0 PS2 18 (SUTURE) ×2 IMPLANT
SUT PROLENE 6 0 BV (SUTURE) ×4 IMPLANT
SUT SILK 2 0 SH (SUTURE) ×1 IMPLANT
SUT VIC AB 3-0 SH 27 (SUTURE) ×4
SUT VIC AB 3-0 SH 27X BRD (SUTURE) ×2 IMPLANT
SYR 20ML LL LF (SYRINGE) ×1 IMPLANT
SYR TOOMEY 50ML (SYRINGE) IMPLANT
TOWEL GREEN STERILE (TOWEL DISPOSABLE) ×2 IMPLANT
TUBING EXTENTION W/L.L. (IV SETS) ×1 IMPLANT
UNDERPAD 30X30 (UNDERPADS AND DIAPERS) ×2 IMPLANT
WATER STERILE IRR 1000ML POUR (IV SOLUTION) ×2 IMPLANT
WIRE HI TORQ VERSACORE J 260CM (WIRE) ×1 IMPLANT

## 2020-03-19 NOTE — Transfer of Care (Signed)
Immediate Anesthesia Transfer of Care Note  Patient: Heather [name]  Procedure(s) Performed: INSERTION OF ARTERIOVENOUS (AV) GORE-TEX GRAFT IN RIGHT ARM AND VIABAHN STENT IN RIGHT AXILLARY ARTERY (Right )  Patient Location: PACU  Anesthesia Type:MAC  Level of Consciousness: awake, alert , oriented and patient cooperative  Airway & Oxygen Therapy: Patient Spontanous Breathing and Patient connected to face mask oxygen  Post-op Assessment: Report given to RN, Post -op Vital signs reviewed and stable and Patient moving all extremities  Post vital signs: Reviewed and stable  Last Vitals:  Vitals Value Taken Time  BP 112/62 03/19/20 0933  Temp 36.6 C 03/19/20 0933  Pulse 86 03/19/20 0943  Resp 13 03/19/20 0943  SpO2 97 % 03/19/20 0943  Vitals shown include unvalidated device data.  Last Pain:  Vitals:   03/19/20 0933  TempSrc:   PainSc: Asleep      Patients Stated Pain Goal: 3 (10/26/58 4585)  Complications: No apparent anesthesia complications

## 2020-03-19 NOTE — Discharge Instructions (Signed)
   [facility]  Discharge Instructions  AV Fistula or Graft Surgery for Dialysis Access  Please refer to the following instructions for your post-procedure care. Your surgeon or physician assistant will discuss any changes with you.  Activity  You may drive the day following your surgery, if you are comfortable and no longer taking prescription pain medication. Resume full activity as the soreness in your incision resolves.  Bathing/Showering  You may shower after you go home. Keep your incision dry for 48 hours. Do not soak in a bathtub, hot tub, or swim until the incision heals completely. You may not shower if you have a hemodialysis catheter.  Incision Care  Clean your incision with mild soap and water after 48 hours. Pat the area dry with a clean towel. You do not need a bandage unless otherwise instructed. Do not apply any ointments or creams to your incision. You may have skin glue on your incision. Do not peel it off. It will come off on its own in about one week. Your arm may swell a bit after surgery. To reduce swelling use pillows to elevate your arm so it is above your heart. Your doctor will tell you if you need to lightly wrap your arm with an ACE bandage.  Diet  Resume your normal diet. There are not special food restrictions following this procedure. In order to heal from your surgery, it is CRITICAL to get adequate nutrition. Your body requires vitamins, minerals, and protein. Vegetables are the best source of vitamins and minerals. Vegetables also provide the perfect balance of protein. Processed food has little nutritional value, so try to avoid this.  Medications  Resume taking all of your medications. If your incision is causing pain, you may take over-the counter pain relievers such as acetaminophen (Tylenol). If you were prescribed a stronger pain medication, please be aware these medications can cause nausea and constipation. Prevent  nausea by taking the medication with a snack or meal. Avoid constipation by drinking plenty of fluids and eating foods with high amount of fiber, such as fruits, vegetables, and grains. Do not take Tylenol if you are taking prescription pain medications.  Do resume your your Eliquis until 03/21/20   Follow up Your surgeon may want to see you in the office following your access surgery. If so, this will be arranged at the time of your surgery.  Please call us immediately for any of the following conditions:  Increased pain, redness, drainage (pus) from your incision site Fever of 101 degrees or higher Severe or worsening pain at your incision site Hand pain or numbness.  Reduce your risk of vascular disease:  Stop smoking. If you would like help, call [facility] at 1-800-QUIT-NOW 574-124-6439) or [place] at [place] your cholesterol Maintain a desired weight Control your diabetes Keep your blood pressure down  Dialysis  It will take several weeks to several months for your new dialysis access to be ready for use. Your surgeon will determine when it is OK to use it. Your nephrologist will continue to direct your dialysis. You can continue to use your Permcath until your new access is ready for use.  If you have any questions, please call the office at [phone].

## 2020-03-19 NOTE — Progress Notes (Signed)
VAST returned call to lab after being paged. Lab questioning if labs could be drawn from pt's Vascath. Educated that labs can only be drawn from a temporary Trialysis catheter. Lab verbalized understanding.

## 2020-03-19 NOTE — Op Note (Signed)
Patient name: Heather [name] MRN: [id number] DOB: 1971/08/31 Sex: female  03/19/2020 Pre-operative Diagnosis: End-stage renal disease Post-operative diagnosis: Same Surgeon:  Heather Quan C. Donzetta Matters, MD Assistant: Heather Fruit, PA Procedure Performed: 1.  Right upper extremity brachial artery to axillary vein AV graft with 4-7 mm PTFE 2.  Right axillary vein stent with 8 x 10 mm Viabahn  Indications: 49 year old female with multiple bilateral upper extremity failed access procedures.  Most recently the left side has failed with thrombosed Artegraft.  On the right side she had a previous brachial artery to axillary vein graft because thrombosis of her right upper extremity requiring vein patch angioplasty of the distal brachial artery.  She recently underwent venography which demonstrated patent central veins.  [name] now indicated for redo right upper extremity graft.  Findings: Axillary vein was diminutive but initially said anastomosis there.  Brachial artery was healthy.  At completion there was pulsatility of the graft.  We elected to perform shuntogram and then stented across the anastomosis with an 8 x 10 mm Viabahn.  Unfortunately during this procedure the graft thrombosed.  We performed thrombectomy through the graftotomy and he had strong inflow.  We did have good signal in the runoff vein.  If the graft fails it is likely because of arterial inflow she would need consideration of groin access as all upper extremity accesses have failed.   Procedure:  The patient was identified in the holding area and taken to the operating where she is placed supine operative table MAC anesthesia was induced.  She was sterilely prepped draped in the right upper extremity usual fashion antibiotics were minister and timeout was called.  We began by anesthetizing the planned upper extremity incisions.  We used ultrasound to identify the vein and artery.  The vein in the axilla was actually quite  diminutive.  We then opened her previous scar and dissected down through scar tissue and divided the fascia.  We identified the nerve mobilize this medially.  We identified the vein was quite diminutive we marked for orientation.  We then made an incision in the mid upper arm dissected down through the soft tissue and the fascia identified the brachial artery placed a vessel loop around this.  Brachial artery did appear healthy but was quite diminutive.  We then tunneled between the 2 incisions and placed a 4 to 7 mm graft between them.  We then clamped our axillary vein and divided.  We tied it off distally.  We sewed the graft and and with 6-0 Prolene suture.  Upon completion we then flushed through the graft.  The graft to the vein was actually quite a small anastomosis.  We then clamped the brachial artery distally and proximally opened longitudinally and flushed with heparinized saline.  We turned our graft to size and sewed in place with 6-0 Prolene suture.  We flushed in both directions.  Upon completion of pulsatility the graft.  There is runoff signal in the vein I do not think would be suitable given how small the vein was in the axilla that was too high for taking the anastomosis any higher.  We then cannulated the graft with micropuncture needle followed wire and sheath.  We performed shuntogram which demonstrated tight stenosis at our anastomosis there was also poor runoff of the vein.  We placed a versa core wire across this deployed and a Viabahn stent was 8 mm by 5 cm.  We then postdilated this conservatively.  Completion demonstrated brisk  flow centrally.  Unfortunately now we had no arterial flow in the graft.  We have clamped near the venous anastomosis.  We's estimate 3 Fogarty proximally and very strong inflow.  We then clamped the graft after flushing with heparinized saline suture ligated our graftotomy with 6-0 Prolene suture.  We now had strong pulsatility in the graft we thrill by Doppler.   There were good signals in the radial and ulnar artery at the wrist.  Satisfied we irrigated both wounds and closed in layers with Vicryl and Monocryl.  Dermabond was placed at the level of the skin.  On completion he had very weak thrill in the graft itself and can only detect most of the flow with Doppler in the axilla.  She would be at high risk for primary nonfunction of this graft.  EBL: 50cc  Contrast: 60cc   Heather Whetsel C. Donzetta Matters, MD [facility] [place] Office: [phone] Pager: [phone]

## 2020-03-19 NOTE — Progress Notes (Signed)
Assess left arm for PIV with ultrasound.  Not able to find any suitable vein.  Right arm restricted.

## 2020-03-19 NOTE — Progress Notes (Deleted)
Name: Heather [name]  Age/ Sex: 49 y.o., female   MRN/ DOB: [id number], March 27, 1971     PCP: Inda Coke, PA   Reason for Endocrinology Evaluation: Type 2 Diabetes Mellitus  Initial Endocrine Consultative Visit: 12/25/2020    PATIENT IDENTIFIER: Heather [name] is a 49 y.o. female with a past medical history of T2DM, HTN, end-stage renal disease on HD the patient has followed with Endocrinology clinic since 12/30/2021for consultative assistance with management of her diabetes.  DIABETIC HISTORY:  Heather [name] was diagnosed with T2DM at age 23.she has been on Metformin in the past with GI side effects.  Insulin started years ago. Her hemoglobin A1c has ranged from 6.4% in 2018, peaking at 12.9% in 2016.  Hemodialysis started 06/2018 (Tuesday, Thursday, and Saturday)-sees Dr. Augustin Coupe  On her initial visit to our clinic she had an A1c of 7.2%, she was on MDI regimen. SUBJECTIVE:   During the last visit (12/25/2020): A1c 7.2%, we adjusted her MDI regimen  Today (03/19/2020): Heather [name] is here for follow-up on diabetes management. She checks her blood sugars multiple times daily through freestyle libre. The patient has *** had hypoglycemic episodes since the last clinic visit, which typically occur *** x / - most often occuring ***. The patient is *** symptomatic with these episodes, with symptoms of {symptoms; hypoglycemia:9084048}. Otherwise, the patient {HAS/HAS NOT:522402} required any recent emergency interventions for hypoglycemia and {HAS/HAS NOT:522402} had recent hospitalizations secondary to hyper or hypoglycemic episodes.      HOME DIABETES REGIMEN:  Tresiba 26 units daily NovoLog 10 units with each meal    METER DOWNLOAD SUMMARY: Date range evaluated: *** Fingerstick Blood Glucose Tests = *** Average Number Tests/Day = *** Overall Mean FS Glucose = *** Standard  Deviation = ***  BG Ranges: Low = *** High = ***   Hypoglycemic Events/30 Days: BG < 50 = *** Episodes of symptomatic severe hypoglycemia = ***    DIABETIC COMPLICATIONS: Microvascular complications:   ESRD, Retinopathy and Neuropathy  Last eye exam: Completed 2019  Macrovascular complications:   CVA- affects walking   Denies: CAD, PVD   HISTORY:  Past Medical History:  Past Medical History:  Diagnosis Date  . Antiplatelet or antithrombotic long-term use: Plavix 06/30/2017  . B12 deficiency 05/10/2014  . Blood transfusion without reported diagnosis   . Chronic constipation   . 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 Cyst of left ovary 01/12/2018  . Diabetic neuropathy, painful ([place]), on low dose Gabapentin 07/13/2013  . DM (diabetes mellitus), type 2, uncontrolled, with renal complications ([place]) 40/98/1191  . DM2 (diabetes mellitus, type 2) ([place])   . Dysfunction of left eustachian tube, with pusatile tinnitus 11/24/2017  . Dyslipidemia associated with type 2 diabetes mellitus ([place]) 06/25/2016  . ESRD (end stage renal disease) on dialysis [facility])    On hemodialysis in July 2019 via [facility] then switched to [facility] in Nov 2019.   [name] ESRD with anemia ([place])   . Fibromyalgia   . Gastroparesis due to DM   . GERD (gastroesophageal reflux disease)   . Headache   . Hypertension associated with diabetes ([place]) 02/19/2011  . IBS (irritable bowel syndrome)   . Left-sided weakness 12/10/2017   .  [name] Leiomyoma of uterus   . Pancreatitis   . PE (pulmonary thromboembolism) ([place]) 11/04/2019  . Pneumonia   . Proliferative diabetic retinopathy ([place]) 09/29/2015  . Pronation deformity of both feet  06/14/2014  . Reactive depression 12/10/2017   .  [name] Right-sided low back pain without sciatica 12/08/2015  . Secondary hyperthyroidism 11/27/2013  . Steal syndrome dialysis vascular access ([place]) 02/17/2018  .  Thromboembolism ([place]) 04/05/2018  . Upper airway cough syndrome, with recs to stay off ACE and take Pepcid q hs 11/15/2016    Past Surgical History:  Past Surgical History:  Procedure Laterality Date  . A/V FISTULAGRAM Right 03/03/2020   Procedure: Venogram;  Surgeon: Waynetta Sandy, MD;  Location: [place] CV [facility];  Service: Cardiovascular;  Laterality: Right;  . ARTERY REPAIR Right 02/17/2018   Procedure: EXPLORATION OF RIGHT BRACHIAL ARTERY;  Surgeon: Conrad Fairbanks, MD;  Location: [place];  Service: Vascular;  Laterality: Right;  . ARTERY REPAIR Right 02/17/2018   Procedure: BRACHIAL ARTERY EXPLORATION AND TRHOMBECTOMY;  Surgeon: Conrad [place], MD;  Location: [facility] OR;  Service: Vascular;  Laterality: Right;  . AV FISTULA PLACEMENT Left 05/09/2017   Procedure: INSERTION OF ARTERIOVENOUS (AV) GRAFT ARM (ARTEGRAFT);  Surgeon: Conrad Alt[place]ont, MD;  Location: [facility] OR;  Service: Vascular;  Laterality: Left;  . AV FISTULA PLACEMENT Right 02/17/2018   Procedure: INSERTION OF ARTERIOVENOUS (AV) GORE-TEX GRAFT ARM RIGHT UPPER ARM;  Surgeon: Conrad [place], MD;  Location: [place];  Service: Vascular;  Laterality: Right;  . AV FISTULA PLACEMENT Left 10/10/2019   Procedure: INSERTION OF LEFT ARTERIOVENOUS (AV) ARTEGRAFT GRAFT ARM;  Surgeon: Waynetta Sandy, MD;  Location: [place];  Service: Vascular;  Laterality: Left;  . BASCILIC VEIN TRANSPOSITION Left 08/31/2016   Procedure: BASILIC VEIN TRANSPOSITION FIRST STAGE;  Surgeon: Conrad [place], MD;  Location: [place];  Service: Vascular;  Laterality: Left;  . CENTRAL VENOUS CATHETER INSERTION Left 07/24/2018   Procedure: INSERTION CENTRAL LINE ADULT;  Surgeon: Conrad [place], MD;  Location: [place];  Service: Vascular;  Laterality: Left;  . EYE SURGERY     secondary to diabetic retinopathy   . FOOT SURGERY Right    t"ook bone out- maybe hammer toe"  . INSERTION OF DIALYSIS CATHETER Left 07/24/2018   Procedure: INSERTION OF TUNNELED DIALYSIS CATHETER;   Surgeon: Conrad Wendover, MD;  Location: [place];  Service: Vascular;  Laterality: Left;  . IR FLUORO GUIDE CV LINE RIGHT  07/21/2018  . IR FLUORO GUIDE CV LINE RIGHT  06/01/2019  . IR US GUIDE VASC ACCESS RIGHT  07/21/2018  . IR US GUIDE VASC ACCESS RIGHT  06/01/2019  . LIGATION OF ARTERIOVENOUS  FISTULA Left 05/09/2017   Procedure: LIGATION OF ARTERIOVENOUS  FISTULA;  Surgeon: Conrad Poc[place]ontas, MD;  Location: [place];  Service: Vascular;  Laterality: Left;  . LOOP RECORDER INSERTION N/A 12/05/2017   Procedure: LOOP RECORDER INSERTION;  Surgeon: Evans Lance, MD;  Location: [place] CV LAB;  Service: Cardiovascular;  Laterality: N/A;  . MYOMECTOMY    . REMOVAL OF GRAFT Right 02/17/2018   Procedure: REMOVAL OF RIGHT UPPER ARM ARTERIOVENOUS GRAFT;  Surgeon: Conrad Coppock, MD;  Location: [place];  Service: Vascular;  Laterality: Right;  . REVISION OF ARTERIOVENOUS GORETEX GRAFT Left 07/24/2018   Procedure: REDO ARTERIOVENOUS GORETEX GRAFT;  Surgeon: Conrad [place], MD;  Location: [place];  Service: Vascular;  Laterality: Left;  . TEE WITHOUT CARDIOVERSION N/A 12/05/2017   Procedure: TRANSESOPHAGEAL ECHOCARDIOGRAM (TEE);  Surgeon: Sanda Klein, MD;  Location: [place];  Service: Cardiovascular;  Laterality: N/A;  . UPPER EXTREMITY VENOGRAPHY Left 07/13/2018   Procedure: UPPER EXTREMITY VENOGRAPHY - Central &  Left Arm;  Surgeon: Conrad Butler, MD;  Location: [place] [facility];  Service: Cardiovascular;  Laterality: Left;  . UPPER EXTREMITY VENOGRAPHY Bilateral 09/10/2019   Procedure: UPPER EXTREMITY VENOGRAPHY;  Surgeon: Waynetta Sandy, MD;  Location: [place] [facility];  Service: Cardiovascular;  Laterality: Bilateral;  . UTERINE FIBROID SURGERY    . VITRECTOMY Bilateral      Social History:  reports that she has never smoked. She has never used smokeless tobacco. She reports that she does not drink alcohol or use drugs. Family History:  Family History  Problem Relation Age of Onset  .  Colon polyps Mother   . Diabetes Mother   . Heart murmur Father        history essentially unknown  . Hypertension Father   . Diabetes Sister   . Kidney failure Sister        kidney transplant  . Diabetes Brother   . Retinal degeneration Brother   . Heart murmur Son   . Stroke Paternal Grandmother   . Diabetes Sister       HOME MEDICATIONS: Allergies as of 03/19/2020      Reactions   Ibuprofen Other (See Comments)   CKD stage 3. Should avoid.   Dilaudid [hydromorphone Hcl] Itching, Other (See Comments)   Can take with Benadryl.   Tramadol Itching      Medication List    Notice   This visit is during an admission. Changes to the med list made in this visit will be reflected in the After Visit Summary of the admission.      OBJECTIVE:   Vital Signs: There were no vitals taken for this visit.  Wt Readings from Last 3 Encounters:  03/19/20 225 lb (102.1 kg)  03/03/20 225 lb (102.1 kg)  02/11/20 224 lb (101.6 kg)     Exam: General: Pt appears well and is in NAD  Lungs: Clear with good BS bilat with no rales, rhonchi, or wheezes  Heart: RRR with normal S1 and S2 and no gallops; no murmurs; no rub  Abdomen: Normoactive bowel sounds, soft, nontender, without masses or organomegaly palpable  Extremities: No pretibial edema.   Skin: Normal texture and temperature to palpation.   Neuro: MS is good with appropriate affect, pt is alert and Ox3   DM foot exam: 12/26/2019  The skin of the feet is without sores or ulcerations but pt with multiple callous formation especially on the lateral border of the right foot The pedal pulses are undetected on today's exam. The sensation is intact to a screening 5.07, 10 gram monofilament bilaterally   DATA REVIEWED:  Lab Results  Component Value Date   HGBA1C 7.2 (A) 12/26/2019   HGBA1C 7.3 11/02/2019   HGBA1C 9.1 (H) 05/31/2019   Lab Results  Component Value Date   MICROALBUR 99.5 (H) 04/11/2018   LDLCALC 169 (H)  12/03/2017   CREATININE 8.90 (H) 03/19/2020   Lab Results  Component Value Date   MICRALBCREAT 104.6 (H) 04/11/2018     Lab Results  Component Value Date   CHOL 249 (H) 12/03/2017   HDL 48 12/03/2017   LDLCALC 169 (H) 12/03/2017   LDLDIRECT 120.0 04/17/2015   TRIG 158 (H) 12/03/2017   CHOLHDL 5.2 12/03/2017         ASSESSMENT / PLAN / RECOMMENDATIONS:   1) 1) Type 2 Diabetes Mellitus, Poorly controlled, With retinopathic, neuropathic complications  and ESRD on HD -most recent A1c of *** %.  Goal A1c <  7.5%  Plan: MEDICATIONS:  ***  EDUCATION / INSTRUCTIONS:  BG monitoring instructions: Patient is instructed to check her blood sugars *** times a day, ***.  Call [facility] [facility] if: BG persistently < 70 or > 300. . I reviewed the Rule of 15 for the treatment of hypoglycemia in detail with the patient. Literature supplied.  REFERRALS:  ***.   2) Diabetic complications:   Eye: Does *** have known diabetic retinopathy.   Neuro/ Feet: Does *** have known diabetic peripheral neuropathy .   Renal: Patient does *** have known baseline CKD. She   is *** on an ACEI/ARB at present. Check urine albumin/creatinine ratio yearly starting at time of diagnosis. If albuminuria is positive, treatment is geared toward better glucose, blood pressure control and use of ACE inhibitors or ARBs. Monitor electrolytes and creatinine once to twice yearly.   3) Lipids: Patient is *** on a statin.  4) Hypertension: *** at goal of < 140/90 mmHg.    F/U in ***    Signed electronically by: Mack Guise, MD  [facility] Endocrinology  [facility] [address] [place], [place] [address] Phone: [phone] FAX: [phone]   CC: Inda Coke, [address] [place] [address] Phone: [phone]  Fax: [phone]  Return to Endocrinology clinic as below: Future Appointments  Date Time Provider [place]    03/19/2020 10:30 AM Gracyn Santillanes, Melanie Crazier, MD [facility] None  03/21/2020  9:15 AM Ricard Dillon, MD [facility]  04/14/2020  1:00 PM Bo Merino, MD [facility] None  04/16/2020  9:20 AM [name], MD [facility]  05/20/2020  2:30 PM Bo Merino, MD [facility] None

## 2020-03-19 NOTE — Progress Notes (Signed)
Hyperglycemic Event  CBG: 305  Treatment: 6 units Novolog insulin SQ, per Dr. Valma Cava  Symptoms: None  Follow-up CBG: Time: 0703 CBG Result: 254  Possible Reasons for Event: Other: Diet and medication management  Comments/MD notified: Dr. Bartholomew Boards, RN

## 2020-03-19 NOTE — Progress Notes (Signed)
   I evaluated Ms. Goldsmith in the PACU she does have a weak thrill and pulsatility of the graft mostly detectable with Doppler in the axilla.  She also has a palpable right radial pulse but her hand is cool.  She does have numbness to all of her fingers.  This is doubtful steal given the palpable radial pulse but could be a result of IMN or possible secondary to lidocaine use during the operation high in the axilla.  What ever the cause I discussed with the patient staying at least for observation versus removing the graft now.  Patient has had difficulty with graft in the past having 1 removed after thrombosis of the brachial artery.  [name] agreeable to stay and will be n.p.o. past midnight in case further operations are needed.  I also discussed the options with her husband via telephone he demonstrates good understanding and agrees that overnight observation is the best course of action.  Raegan Sipp C. Donzetta Matters, MD [facility] [place] Office: [phone] Pager: [phone]

## 2020-03-19 NOTE — Progress Notes (Signed)
Received patient from day shift,IVF NS at [facility] infusing via Rt chest hemodialysis.Paged IV team RN to start peripheral IV,IV RN unable start IV. Per patient,she's unable to take oral pain med due to nausea. Paged Fields,MD and got order to leave current IVF infusing. Lotoya Casella, Wonda Cheng, Therapist, sports

## 2020-03-19 NOTE — H&P (Signed)
   History and Physical Update  The patient was interviewed and re-examined.  The patient's previous History and Physical has been reviewed and is unchanged from recent office visit. Plan for right upper arm av graft.   Oliviah Agostini C. Donzetta Matters, MD [facility] [place] Office: [phone] Pager: [phone]  03/19/2020, 7:09 AM

## 2020-03-19 NOTE — Anesthesia Postprocedure Evaluation (Signed)
Anesthesia Post Note  Patient: Heather [name]  Procedure(s) Performed: INSERTION OF ARTERIOVENOUS (AV) GORE-TEX GRAFT IN RIGHT ARM AND VIABAHN STENT IN RIGHT AXILLARY ARTERY (Right )     Patient location during evaluation: PACU Anesthesia Type: MAC Level of consciousness: awake and alert Pain management: pain level controlled Vital Signs Assessment: post-procedure vital signs reviewed and stable Respiratory status: spontaneous breathing, nonlabored ventilation, respiratory function stable and patient connected to nasal cannula oxygen Cardiovascular status: stable and blood pressure returned to baseline Postop Assessment: no apparent nausea or vomiting Anesthetic complications: no    Last Vitals:  Vitals:   03/19/20 1248 03/19/20 1303  BP: (!) 151/72 (!) 154/85  Pulse: 81 81  Resp: 12 14  Temp:    SpO2: 96% 94%    Last Pain:  Vitals:   03/19/20 1216  TempSrc:   PainSc: 6                  Barnet [place]

## 2020-03-20 ENCOUNTER — Inpatient Hospital Stay (HOSPITAL_COMMUNITY): Payer: Managed Care, Other (non HMO) | Admitting: Anesthesiology

## 2020-03-20 ENCOUNTER — Encounter: Payer: Self-pay | Admitting: *Deleted

## 2020-03-20 ENCOUNTER — Encounter (HOSPITAL_COMMUNITY)
Admission: AD | Disposition: A | Discharge status: Home / Self Care | Payer: Self-pay | Source: Home / Self Care | Attending: Vascular Surgery

## 2020-03-20 DIAGNOSIS — E1169 Type 2 diabetes mellitus with other specified complication: Secondary | ICD-10-CM | POA: Diagnosis present

## 2020-03-20 DIAGNOSIS — M797 Fibromyalgia: Secondary | ICD-10-CM | POA: Diagnosis present

## 2020-03-20 DIAGNOSIS — Z7982 Long term (current) use of aspirin: Secondary | ICD-10-CM | POA: Diagnosis not present

## 2020-03-20 DIAGNOSIS — Z20822 Contact with and (suspected) exposure to covid-19: Secondary | ICD-10-CM | POA: Diagnosis present

## 2020-03-20 DIAGNOSIS — Z8673 Personal history of transient ischemic attack (TIA), and cerebral infarction without residual deficits: Secondary | ICD-10-CM | POA: Diagnosis not present

## 2020-03-20 DIAGNOSIS — Z7901 Long term (current) use of anticoagulants: Secondary | ICD-10-CM | POA: Diagnosis not present

## 2020-03-20 DIAGNOSIS — I120 Hypertensive chronic kidney disease with stage 5 chronic kidney disease or end stage renal disease: Secondary | ICD-10-CM | POA: Diagnosis present

## 2020-03-20 DIAGNOSIS — E113599 Type 2 diabetes mellitus with proliferative diabetic retinopathy without macular edema, unspecified eye: Secondary | ICD-10-CM | POA: Diagnosis present

## 2020-03-20 DIAGNOSIS — N186 End stage renal disease: Secondary | ICD-10-CM | POA: Diagnosis present

## 2020-03-20 DIAGNOSIS — T82868A Thrombosis of vascular prosthetic devices, implants and grafts, initial encounter: Secondary | ICD-10-CM | POA: Diagnosis present

## 2020-03-20 DIAGNOSIS — X58XXXA Exposure to other specified factors, initial encounter: Secondary | ICD-10-CM | POA: Diagnosis present

## 2020-03-20 DIAGNOSIS — L89899 Pressure ulcer of other site, unspecified stage: Secondary | ICD-10-CM | POA: Diagnosis present

## 2020-03-20 DIAGNOSIS — E785 Hyperlipidemia, unspecified: Secondary | ICD-10-CM | POA: Diagnosis present

## 2020-03-20 DIAGNOSIS — R531 Weakness: Secondary | ICD-10-CM | POA: Diagnosis present

## 2020-03-20 DIAGNOSIS — T82898A Other specified complication of vascular prosthetic devices, implants and grafts, initial encounter: Secondary | ICD-10-CM | POA: Diagnosis not present

## 2020-03-20 DIAGNOSIS — E1122 Type 2 diabetes mellitus with diabetic chronic kidney disease: Secondary | ICD-10-CM | POA: Diagnosis present

## 2020-03-20 DIAGNOSIS — Z794 Long term (current) use of insulin: Secondary | ICD-10-CM | POA: Diagnosis not present

## 2020-03-20 DIAGNOSIS — D631 Anemia in chronic kidney disease: Secondary | ICD-10-CM | POA: Diagnosis present

## 2020-03-20 DIAGNOSIS — Z79899 Other long term (current) drug therapy: Secondary | ICD-10-CM | POA: Diagnosis not present

## 2020-03-20 DIAGNOSIS — Z992 Dependence on renal dialysis: Secondary | ICD-10-CM | POA: Diagnosis not present

## 2020-03-20 HISTORY — PX: LIGATION ARTERIOVENOUS GORTEX GRAFT: SHX5947

## 2020-03-20 LAB — GLUCOSE, CAPILLARY
Glucose-Capillary: 281 mg/dL — ABNORMAL HIGH (ref 70–99)
Glucose-Capillary: 192 mg/dL — ABNORMAL HIGH (ref 70–99)
Glucose-Capillary: 109 mg/dL — ABNORMAL HIGH (ref 70–99)
Glucose-Capillary: 151 mg/dL — ABNORMAL HIGH (ref 70–99)

## 2020-03-20 LAB — POCT I-STAT, CHEM 8
BUN: 14 mg/dL (ref 6–20)
Calcium, Ion: 1.09 mmol/L — ABNORMAL LOW (ref 1.15–1.40)
Chloride: 97 mmol/L — ABNORMAL LOW (ref 98–111)
Creatinine, Ser: 5.2 mg/dL — ABNORMAL HIGH (ref 0.44–1.00)
Glucose, Bld: 117 mg/dL — ABNORMAL HIGH (ref 70–99)
HCT: 34 % — ABNORMAL LOW (ref 36.0–46.0)
Hemoglobin: 11.6 g/dL — ABNORMAL LOW (ref 12.0–15.0)
Potassium: 3.8 mmol/L (ref 3.5–5.1)
Sodium: 136 mmol/L (ref 135–145)
TCO2: 30 mmol/L (ref 22–32)

## 2020-03-20 LAB — CBC
HCT: 38.8 % (ref 36.0–46.0)
Hemoglobin: 11.8 g/dL — ABNORMAL LOW (ref 12.0–15.0)
MCH: 29.7 pg (ref 26.0–34.0)
MCHC: 30.4 g/dL (ref 30.0–36.0)
MCV: 97.7 fL (ref 80.0–100.0)
Platelets: 134 10*3/uL — ABNORMAL LOW (ref 150–400)
RBC: 3.97 MIL/uL (ref 3.87–5.11)
RDW: 15.9 % — ABNORMAL HIGH (ref 11.5–15.5)
WBC: 9.8 10*3/uL (ref 4.0–10.5)
nRBC: 0 % (ref 0.0–0.2)

## 2020-03-20 LAB — COMPREHENSIVE METABOLIC PANEL
ALT: 22 U/L (ref 0–44)
AST: 19 U/L (ref 15–41)
Albumin: 2.9 g/dL — ABNORMAL LOW (ref 3.5–5.0)
Alkaline Phosphatase: 85 U/L (ref 38–126)
Anion gap: 15 (ref 5–15)
BUN: 37 mg/dL — ABNORMAL HIGH (ref 6–20)
CO2: 21 mmol/L — ABNORMAL LOW (ref 22–32)
Calcium: 9 mg/dL (ref 8.9–10.3)
Chloride: 98 mmol/L (ref 98–111)
Creatinine, Ser: 9.47 mg/dL — ABNORMAL HIGH (ref 0.44–1.00)
GFR calc Af Amer: 5 mL/min — ABNORMAL LOW (ref >60)
GFR calc non Af Amer: 4 mL/min — ABNORMAL LOW (ref >60)
Glucose, Bld: 219 mg/dL — ABNORMAL HIGH (ref 70–99)
Potassium: 5.6 mmol/L — ABNORMAL HIGH (ref 3.5–5.1)
Sodium: 134 mmol/L — ABNORMAL LOW (ref 135–145)
Total Bilirubin: UNDETERMINED mg/dL (ref 0.3–1.2)
Total Protein: 7.2 g/dL (ref 6.5–8.1)

## 2020-03-20 SURGERY — LIGATION ARTERIOVENOUS GORTEX GRAFT
Anesthesia: Monitor Anesthesia Care | Site: Arm Upper | Laterality: Right

## 2020-03-20 MED ORDER — CHLORHEXIDINE GLUCONATE CLOTH 2 % EX PADS
6.0000 | MEDICATED_PAD | Freq: Every day | CUTANEOUS | Status: DC
  Administered 2020-03-20 – 2020-03-21 (×2): 6 via TOPICAL

## 2020-03-20 MED ORDER — PROPOFOL 10 MG/ML IV BOLUS
INTRAVENOUS | Status: AC
  Filled 2020-03-20: qty 20

## 2020-03-20 MED ORDER — 0.9 % SODIUM CHLORIDE (POUR BTL) OPTIME
TOPICAL | Status: DC | PRN
  Administered 2020-03-20: 1000 mL

## 2020-03-20 MED ORDER — ONDANSETRON HCL 4 MG/2ML IJ SOLN: 4.0000 mg | Freq: Once | INTRAMUSCULAR | Status: DC | PRN

## 2020-03-20 MED ORDER — SODIUM CHLORIDE 0.9 % IV SOLN: INTRAVENOUS | Status: DC | PRN

## 2020-03-20 MED ORDER — FENTANYL CITRATE (PF) 250 MCG/5ML IJ SOLN
INTRAMUSCULAR | Status: AC
  Filled 2020-03-20: qty 5

## 2020-03-20 MED ORDER — CEFAZOLIN SODIUM-DEXTROSE 2-3 GM-%(50ML) IV SOLR
INTRAVENOUS | Status: DC | PRN
  Administered 2020-03-20: 2 g via INTRAVENOUS

## 2020-03-20 MED ORDER — DIPHENHYDRAMINE HCL 25 MG PO CAPS
ORAL_CAPSULE | ORAL | Status: AC
  Filled 2020-03-20: qty 1

## 2020-03-20 MED ORDER — RENA-VITE PO TABS
1.0000 | ORAL_TABLET | Freq: Every day | ORAL | Status: DC
  Administered 2020-03-20: 1 via ORAL
  Filled 2020-03-20: qty 1

## 2020-03-20 MED ORDER — DIPHENHYDRAMINE HCL 50 MG/ML IJ SOLN: 6.2500 mg | Freq: Once | INTRAMUSCULAR | Status: AC

## 2020-03-20 MED ORDER — DIPHENHYDRAMINE HCL 50 MG/ML IJ SOLN
25.0000 mg | Freq: Once | INTRAMUSCULAR | Status: AC
  Administered 2020-03-20: 25 mg via INTRAVENOUS
  Filled 2020-03-20: qty 1

## 2020-03-20 MED ORDER — LIDOCAINE HCL (PF) 1 % IJ SOLN
INTRAMUSCULAR | Status: AC
  Filled 2020-03-20: qty 30

## 2020-03-20 MED ORDER — FENTANYL CITRATE (PF) 100 MCG/2ML IJ SOLN
25.0000 ug | INTRAMUSCULAR | Status: DC | PRN
  Administered 2020-03-20: 25 ug via INTRAVENOUS

## 2020-03-20 MED ORDER — MIDODRINE HCL 5 MG PO TABS
ORAL_TABLET | ORAL | Status: AC
  Filled 2020-03-20: qty 1

## 2020-03-20 MED ORDER — MIDAZOLAM HCL 2 MG/2ML IJ SOLN
INTRAMUSCULAR | Status: AC
  Filled 2020-03-20: qty 2

## 2020-03-20 MED ORDER — HEPARIN SODIUM (PORCINE) 1000 UNIT/ML IJ SOLN
INTRAMUSCULAR | Status: DC | PRN
  Administered 2020-03-20: 5000 [IU] via INTRAVENOUS

## 2020-03-20 MED ORDER — FENTANYL CITRATE (PF) 100 MCG/2ML IJ SOLN
INTRAMUSCULAR | Status: AC
  Filled 2020-03-20: qty 2

## 2020-03-20 MED ORDER — SODIUM CHLORIDE 0.9 % IV SOLN: INTRAVENOUS | Status: DC

## 2020-03-20 MED ORDER — ONDANSETRON HCL 4 MG/2ML IJ SOLN
INTRAMUSCULAR | Status: AC
  Filled 2020-03-20: qty 2

## 2020-03-20 MED ORDER — MIDAZOLAM HCL 5 MG/5ML IJ SOLN
INTRAMUSCULAR | Status: DC | PRN
  Administered 2020-03-20: 2 mg via INTRAVENOUS

## 2020-03-20 MED ORDER — DIPHENHYDRAMINE HCL 50 MG/ML IJ SOLN
INTRAMUSCULAR | Status: AC
  Filled 2020-03-20: qty 1

## 2020-03-20 MED ORDER — LIDOCAINE HCL (PF) 1 % IJ SOLN
INTRAMUSCULAR | Status: DC | PRN
  Administered 2020-03-20: 10 mL

## 2020-03-20 MED ORDER — SODIUM CHLORIDE 0.9 % IV SOLN
INTRAVENOUS | Status: AC
  Filled 2020-03-20: qty 1.2

## 2020-03-20 MED ORDER — FENTANYL CITRATE (PF) 100 MCG/2ML IJ SOLN
INTRAMUSCULAR | Status: DC | PRN
  Administered 2020-03-20: 50 ug via INTRAVENOUS
  Administered 2020-03-20: 25 ug via INTRAVENOUS

## 2020-03-20 MED ORDER — SODIUM CHLORIDE 0.9 % IV SOLN
INTRAVENOUS | Status: DC | PRN
  Administered 2020-03-20: 500 mL

## 2020-03-20 MED ORDER — HEMOSTATIC AGENTS (NO CHARGE) OPTIME
TOPICAL | Status: DC | PRN
  Administered 2020-03-20: 1 via TOPICAL

## 2020-03-20 MED ORDER — HEPARIN SODIUM (PORCINE) 1000 UNIT/ML IJ SOLN
INTRAMUSCULAR | Status: AC
  Administered 2020-03-20: 3200 [IU]
  Filled 2020-03-20: qty 4

## 2020-03-20 MED ORDER — PROPOFOL 500 MG/50ML IV EMUL
INTRAVENOUS | Status: DC | PRN
  Administered 2020-03-20: 20 ug/kg/min via INTRAVENOUS

## 2020-03-20 SURGICAL SUPPLY — 41 items
CANISTER SUCT 3000ML PPV (MISCELLANEOUS) ×3 IMPLANT
CATH EMB 3FR 80CM (CATHETERS) ×2 IMPLANT
CLIP VESOCCLUDE MED 6/CT (CLIP) ×4 IMPLANT
CLIP VESOCCLUDE SM WIDE 6/CT (CLIP) ×2 IMPLANT
COVER PROBE W GEL 5X96 (DRAPES) ×3 IMPLANT
COVER WAND RF STERILE (DRAPES) ×1 IMPLANT
DERMABOND ADVANCED (GAUZE/BANDAGES/DRESSINGS) ×2
DERMABOND ADVANCED .7 DNX12 (GAUZE/BANDAGES/DRESSINGS) ×1 IMPLANT
ELECT REM PT RETURN 9FT ADLT (ELECTROSURGICAL) ×3
ELECTRODE REM PT RTRN 9FT ADLT (ELECTROSURGICAL) ×1 IMPLANT
GAUZE SPONGE 2X2 8PLY STRL LF (GAUZE/BANDAGES/DRESSINGS) IMPLANT
GAUZE SPONGE 4X4 12PLY STRL (GAUZE/BANDAGES/DRESSINGS) ×3 IMPLANT
GLOVE BIO SURGEON STRL SZ 6.5 (GLOVE) ×2 IMPLANT
GLOVE BIO SURGEON STRL SZ7 (GLOVE) ×3 IMPLANT
GLOVE BIO SURGEONS STRL SZ 6.5 (GLOVE) ×2
GLOVE BIOGEL PI IND STRL 6.5 (GLOVE) IMPLANT
GLOVE BIOGEL PI IND STRL 7.5 (GLOVE) ×1 IMPLANT
GLOVE BIOGEL PI INDICATOR 6.5 (GLOVE) ×4
GLOVE BIOGEL PI INDICATOR 7.5 (GLOVE) ×4
GLOVE ECLIPSE 7.0 STRL STRAW (GLOVE) ×2 IMPLANT
GOWN STRL REUS W/ TWL LRG LVL3 (GOWN DISPOSABLE) ×3 IMPLANT
GOWN STRL REUS W/TWL LRG LVL3 (GOWN DISPOSABLE) ×9
HEMOSTAT SNOW SURGICEL 2X4 (HEMOSTASIS) ×2 IMPLANT
KIT BASIN OR (CUSTOM PROCEDURE TRAY) ×3 IMPLANT
KIT TURNOVER KIT B (KITS) ×3 IMPLANT
NS IRRIG 1000ML POUR BTL (IV SOLUTION) ×3 IMPLANT
PACK CV ACCESS (CUSTOM PROCEDURE TRAY) ×3 IMPLANT
PAD ARMBOARD 7.5X6 YLW CONV (MISCELLANEOUS) ×6 IMPLANT
PATCH VASC XENOSURE 1CMX6CM (Vascular Products) ×3 IMPLANT
PATCH VASC XENOSURE 1X6 (Vascular Products) IMPLANT
SPONGE GAUZE 2X2 STER 10/PKG (GAUZE/BANDAGES/DRESSINGS) ×2
SUT ETHILON 3 0 PS 1 (SUTURE) IMPLANT
SUT MNCRL AB 4-0 PS2 18 (SUTURE) ×6 IMPLANT
SUT PROLENE 6 0 BV (SUTURE) IMPLANT
SUT SILK 0 TIES 10X30 (SUTURE) ×3 IMPLANT
SUT VIC AB 3-0 SH 27 (SUTURE) ×3
SUT VIC AB 3-0 SH 27X BRD (SUTURE) ×1 IMPLANT
TAPE PAPER 3X10 WHT MICROPORE (GAUZE/BANDAGES/DRESSINGS) ×2 IMPLANT
TOWEL GREEN STERILE (TOWEL DISPOSABLE) ×3 IMPLANT
UNDERPAD 30X30 (UNDERPADS AND DIAPERS) ×3 IMPLANT
WATER STERILE IRR 1000ML POUR (IV SOLUTION) ×3 IMPLANT

## 2020-03-20 NOTE — Plan of Care (Signed)
  Problem: Education: Goal: Knowledge of General Education information will improve Description Including pain rating scale, medication(s)/side effects and non-pharmacologic comfort measures Outcome: Progressing   

## 2020-03-20 NOTE — Anesthesia Postprocedure Evaluation (Signed)
Anesthesia Post Note  Patient: Heather [name]  Procedure(s) Performed: LIGATION ARM ARTERIOVENOUS GORTEX GRAFT With Patch Angioplasty, Thrombectomy (Right Arm Upper)     Patient location during evaluation: PACU Anesthesia Type: MAC Level of consciousness: awake and alert Pain management: pain level controlled Vital Signs Assessment: post-procedure vital signs reviewed and stable Respiratory status: spontaneous breathing, nonlabored ventilation, respiratory function stable and patient connected to nasal cannula oxygen Cardiovascular status: stable and blood pressure returned to baseline Postop Assessment: no apparent nausea or vomiting Anesthetic complications: no    Last Vitals:  Vitals:   03/20/20 2122 03/20/20 2134  BP: 112/68   Pulse: 93 97  Resp: 16   Temp:    SpO2: (!) 83% 91%    Last Pain:  Vitals:   03/20/20 2003  TempSrc: Oral  PainSc:                  Karyl Kinnier Koki [place]

## 2020-03-20 NOTE — Anesthesia Preprocedure Evaluation (Addendum)
Anesthesia Evaluation  Patient identified by MRN, date of birth, ID band Patient awake    Reviewed: Allergy & Precautions, NPO status , Patient's Chart, lab work & pertinent test results  Airway Mallampati: II  TM Distance: >3 FB Neck ROM: Full    Dental  (+) Dental Advisory Given, Missing   Pulmonary neg pulmonary ROS,    Pulmonary exam normal breath sounds clear to auscultation       Cardiovascular hypertension, Pt. on medications Normal cardiovascular exam Rhythm:Regular Rate:Normal     Neuro/Psych  Headaches, PSYCHIATRIC DISORDERS Depression  Neuromuscular disease CVA, Residual Symptoms    GI/Hepatic Neg liver ROS, GERD  Medicated,Gastroparesis    Endo/Other  diabetes, Type 2, Insulin DependentHyperthyroidism Obesity   Renal/GU ESRF and DialysisRenal disease     Musculoskeletal  (+) Fibromyalgia -  Abdominal   Peds  Hematology  (+) Blood dyscrasia (Thrombocytopenia; Eliquis), anemia ,   Anesthesia Other Findings Day of surgery medications reviewed with the patient.  Reproductive/Obstetrics                            Anesthesia Physical Anesthesia Plan  ASA: IV  Anesthesia Plan: MAC   Post-op Pain Management:    Induction: Intravenous  PONV Risk Score and Plan: 2 and Propofol infusion and Treatment may vary due to age or medical condition  Airway Management Planned: Natural Airway and Nasal Cannula  Additional Equipment:   Intra-op Plan:   Post-operative Plan:   Informed Consent: I have reviewed the patients History and Physical, chart, labs and discussed the procedure including the risks, benefits and alternatives for the proposed anesthesia with the patient or authorized representative who has indicated his/her understanding and acceptance.     Dental advisory given  Plan Discussed with: CRNA  Anesthesia Plan Comments:        Anesthesia Quick Evaluation

## 2020-03-20 NOTE — Transfer of Care (Signed)
Immediate Anesthesia Transfer of Care Note  Patient: Heather [name]  Procedure(s) Performed: LIGATION ARM ARTERIOVENOUS GORTEX GRAFT With Patch Angioplasty, Thrombectomy (Right Arm Upper)  Patient Location: PACU  Anesthesia Type:MAC  Level of Consciousness: drowsy, patient cooperative and responds to stimulation  Airway & Oxygen Therapy: Patient Spontanous Breathing  Post-op Assessment: Report given to RN and Post -op Vital signs reviewed and stable  Post vital signs: Reviewed and stable  Last Vitals:  Vitals Value Taken Time  BP 118/101 03/20/20 1821  Temp    Pulse 96 03/20/20 1821  Resp 11 03/20/20 1821  SpO2 94 % 03/20/20 1821  Vitals shown include unvalidated device data.  Last Pain:  Vitals:   03/20/20 1616  TempSrc: Oral  PainSc: 0-No pain      Patients Stated Pain Goal: 0 (09/32/35 5732)  Complications: No apparent anesthesia complications

## 2020-03-20 NOTE — Progress Notes (Addendum)
Assessed patient. Infusion running through arterial lumen of dialysis catheter. Filter extension atatched to line. Instructed nurse to contact patient's physician and to have renal reconsulted for dialysis line care orders and monitoring. Will discontinue after PRN med administered unless further orders received. VU. Fran Lowes, RN VAST

## 2020-03-20 NOTE — Progress Notes (Addendum)
Progress Note    03/20/2020 7:41 AM 1 Day Post-Op  Subjective:  C/o hand numbness and inability to flex 1st and 2nd right digits fully. No had pain. Vomited once last night. Hasn't eaten this morning yet   Vitals:   03/20/20 0245 03/20/20 0606  BP: 104/65 128/73  Pulse: 92 88  Resp: 14 18  Temp: 98.4 F (36.9 C) 98.9 F (37.2 C)  SpO2: 100% 96%    Physical Exam: Cardiac:  RRR Lungs:  nonlabored Incisions:  Well approximated. No bleeding or edema Extremities:  RUE:  + Doppler ulnar > radial. + pulsatile Doppler signal in fistula  Right hand with 3/5 grip strength. Cooler as compared to left hand and   CBC    Component Value Date/Time   WBC 9.8 03/20/2020 0358   RBC 3.97 03/20/2020 0358   HGB 11.8 (L) 03/20/2020 0358   HCT 38.8 03/20/2020 0358   PLT 134 (L) 03/20/2020 0358   MCV 97.7 03/20/2020 0358   MCH 29.7 03/20/2020 0358   MCHC 30.4 03/20/2020 0358   RDW 15.9 (H) 03/20/2020 0358   LYMPHSABS 1.3 10/13/2019 0818   MONOABS 0.6 10/13/2019 0818   EOSABS 0.3 10/13/2019 0818   BASOSABS 0.0 10/13/2019 0818    BMET    Component Value Date/Time   NA 134 (L) 03/20/2020 0358   NA 133 (A) 03/20/2019 0000   K 5.6 (H) 03/20/2020 0358   K 4.3 10/16/2013 0000   CL 98 03/20/2020 0358   CL 100 10/16/2013 0000   CO2 21 (L) 03/20/2020 0358   GLUCOSE 219 (H) 03/20/2020 0358   BUN 37 (H) 03/20/2020 0358   BUN 41 (A) 03/20/2019 0000   CREATININE 9.47 (H) 03/20/2020 0358   CREATININE 3.05 (H) 09/03/2015 0855   CALCIUM 9.0 03/20/2020 0358   CALCIUM 8.9 10/16/2013 0000   GFRNONAA 4 (L) 03/20/2020 0358   GFRNONAA 18 (L) 09/03/2015 0855   GFRAA 5 (L) 03/20/2020 0358   GFRAA 20 (L) 09/03/2015 0855     Intake/Output Summary (Last 24 hours) at 03/20/2020 0741 Last data filed at 03/20/2020 0600 Gross per 24 hour  Intake 720 ml  Output 50 ml  Net 670 ml    HOSPITAL MEDICATIONS Scheduled Meds: . amLODipine  10 mg Oral QHS  . Chlorhexidine Gluconate Cloth  6 each  Topical Daily  . docusate sodium  100 mg Oral BID  . heparin  5,000 Units Subcutaneous Q8H  . insulin aspart  0-5 Units Subcutaneous QHS  . insulin aspart  0-6 Units Subcutaneous TID WC  . insulin aspart  6 Units Subcutaneous TID WC  . lisinopril  10 mg Oral Daily  . lubiprostone  24 mcg Oral BID WC  . midodrine  5 mg Oral BID WC  . pantoprazole  40 mg Oral Daily  . sodium chloride flush  3 mL Intravenous Q12H   Continuous Infusions: . sodium chloride     PRN Meds:.sodium chloride, acetaminophen **OR** acetaminophen, alum & mag hydroxide-simeth, diphenhydrAMINE, gabapentin, guaiFENesin-dextromethorphan, hydrALAZINE, labetalol, metoprolol tartrate, morphine injection, ondansetron, oxyCODONE, phenol, sodium chloride flush  Assessment/Plan:  49 y.o. female is s/p: right AVG placement.   Concerning weakness and numbness of right hand.  May need explantation. Will contact her nephrologist for inpatient HD today.  [place], PA-C [facility] [phone] 03/20/2020  7:41 AM   I have independently interviewed and examined patient and agree with PA assessment and plan above.  She has a weakly palpable  right radial pulse.  She finished her orange juice at 9 AM we will plan for surgical ligation of the graft with patch angioplasty of the brachial artery approximately 3 PM today.  She will need dialysis while here and we have contacted nephrology.  Kaysen Sefcik C. Donzetta Matters, MD [facility] [place] Office: [phone] Pager: [phone]

## 2020-03-20 NOTE — Op Note (Signed)
    Patient name: Heather [name] MRN: [id number] DOB: Apr 29, 1971 Sex: female  03/20/2020 Pre-operative Diagnosis: esrd, right upper extremity steal Post-operative diagnosis:  Same Surgeon:  Eda Paschal. Donzetta Matters, MD Assistant: Laurence Slate, PA Procedure Performed: 1.  Ligation of right arm av graft 2.  Right upper extremity thrombectomy 3.  Bovine pericardial patch angioplasty of right brachial artery  Indications: 49 year old female with end-stage renal disease.  I placed a graft in her right upper extremity the day prior to this.  She now has coolness to her hand and difficulty moving her hand and is indicated for ligation of the graft.  Findings: The ligation was at least partially thrombosed and had thrombus extending into the brachial artery lumen.  I performed thromboembolectomy to achieve good backbleeding.  Patch angioplasty was performed at completion there was strong radial and ulnar artery signals of the wrist.   Procedure:  The patient was identified in the holding area and taken to the operating room where she is placed supine operative 1 MAC anesthesia was induced.  She was sterilely prepped and draped in the right upper extremity usual fashion antibiotics were ministered and timeout was called.  We anesthetized the distal incision on her arm with 1% lidocaine.  We open this sharply.  We dissected down to the graft.  The graft appeared to have some pulsatility.  We dissected out the brachial artery more proximally and distally.  We then administered 5000 units of heparin.  I clamped the artery distally and proximally.  I transected the graft there was only minimal backbleeding.  I tied it off with 2-0 silk remove part of the graft out of the wound bed.  There was some clot in the arteriotomy.  I passed a 3 Fogarty distally only returned very minimal clot from right at the arteriotomy site.  I irrigated distally with heparinized saline and clamped.  I had strong inflow proximally.  I  fashioned a bovine pericardial patch and sewed it in place with 6-0 Prolene suture.  Prior to completion allowed flushing all directions.  Upon completion there was good pulsatility the brachial artery this was confirmed with Doppler.  There is good radial and ulnar artery signals at the wrist.  Satisfied we irrigated and obtain hemostasis and closed in layers with Vicryl and Monocryl.  Dermabond is placed at the level of the skin.  She was awakened anesthesia having tolerated procedure without any complication.  Counts were correct at completion.  EBL: 50 cc   Derrell Milanes C. Donzetta Matters, MD [facility] [place] Office: [phone] Pager: [phone]

## 2020-03-21 ENCOUNTER — Encounter (HOSPITAL_BASED_OUTPATIENT_CLINIC_OR_DEPARTMENT_OTHER): Payer: Managed Care, Other (non HMO) | Admitting: Internal Medicine

## 2020-03-21 LAB — GLUCOSE, CAPILLARY
Glucose-Capillary: 200 mg/dL — ABNORMAL HIGH (ref 70–99)
Glucose-Capillary: 97 mg/dL (ref 70–99)

## 2020-03-21 NOTE — Progress Notes (Addendum)
  Progress Note    03/21/2020 7:35 AM 1 Day Post-Op  Subjective:  R arm feels better   Vitals:   03/21/20 0436 03/21/20 0513  BP: (!) 84/54 (!) 107/56  Pulse: (!) 102 96  Resp:  19  Temp: (!) 100.6 F (38.1 C) 100 F (37.8 C)  SpO2: 98% 97%   Physical Exam: Lungs:  Non labored Incisions:  R arm incision c/d/i Extremities:  Palpable R radial pulse Abdomen:  Soft, NT, ND Neurologic: A&O  CBC    Component Value Date/Time   WBC 9.8 03/20/2020 0358   RBC 3.97 03/20/2020 0358   HGB 11.6 (L) 03/20/2020 1706   HCT 34.0 (L) 03/20/2020 1706   PLT 134 (L) 03/20/2020 0358   MCV 97.7 03/20/2020 0358   MCH 29.7 03/20/2020 0358   MCHC 30.4 03/20/2020 0358   RDW 15.9 (H) 03/20/2020 0358   LYMPHSABS 1.3 10/13/2019 0818   MONOABS 0.6 10/13/2019 0818   EOSABS 0.3 10/13/2019 0818   BASOSABS 0.0 10/13/2019 0818    BMET    Component Value Date/Time   NA 136 03/20/2020 1706   NA 133 (A) 03/20/2019 0000   K 3.8 03/20/2020 1706   K 4.3 10/16/2013 0000   CL 97 (L) 03/20/2020 1706   CL 100 10/16/2013 0000   CO2 21 (L) 03/20/2020 0358   GLUCOSE 117 (H) 03/20/2020 1706   BUN 14 03/20/2020 1706   BUN 41 (A) 03/20/2019 0000   CREATININE 5.20 (H) 03/20/2020 1706   CREATININE 3.05 (H) 09/03/2015 0855   CALCIUM 9.0 03/20/2020 0358   CALCIUM 8.9 10/16/2013 0000   GFRNONAA 4 (L) 03/20/2020 0358   GFRNONAA 18 (L) 09/03/2015 0855   GFRAA 5 (L) 03/20/2020 0358   GFRAA 20 (L) 09/03/2015 0855    INR    Component Value Date/Time   INR 1.0 03/19/2020 0600     Intake/Output Summary (Last 24 hours) at 03/21/2020 0735 Last data filed at 03/21/2020 0600 Gross per 24 hour  Intake 580 ml  Output 736 ml  Net -156 ml     Assessment/Plan:  49 y.o. female is s/p R arm AVG ligation, thrombectomy R brachial artery 1 Day Post-Op   - Perfusing R hand well with palpable radial pulse - Last option would likely be L thigh AVG however patient is currently healing a lateral foot wound; RLE not  an option based on ABI 0.7 likely SFA occlusion  - Ok for discharge today if feeling better this afternoon   Dagoberto Ligas, PA-C [facility] [phone] 03/21/2020 7:35 AM   I have independently interviewed and examined patient and agree with PA assessment and plan above. Mona for d/c today.  Arwin Bisceglia C. Donzetta Matters, MD [facility] [place] Office: [phone] Pager: [phone]

## 2020-03-21 NOTE — Plan of Care (Signed)
  Problem: Pain Managment: Goal: General experience of comfort will improve Outcome: Progressing   

## 2020-03-21 NOTE — Plan of Care (Signed)
  Problem: Education: Goal: Knowledge of General Education information will improve Description Including pain rating scale, medication(s)/side effects and non-pharmacologic comfort measures Outcome: Progressing   

## 2020-03-21 NOTE — Progress Notes (Signed)
     To whom it may concern,  Please excuse Darnelle Bos, as his wife Gennell underwent surgery and was admitted to [facility] hospital from 3/24 to 3/26.  Please call our office with any further questions.  Thank you,  Dagoberto Ligas, PA-C [facility] [phone] 03/21/2020  2:24 PM

## 2020-03-21 NOTE — Progress Notes (Signed)
DISCHARGE NOTE HOME Heather [name] to be discharged Home per MD order. Discussed prescriptions and follow up appointments with the patient. Prescriptions given to patient; medication list explained in detail. Patient verbalized understanding.  Skin clean, dry and intact without evidence of skin break down, no evidence of skin tears noted. IV catheter discontinued intact. Site without signs and symptoms of complications. Dressing and pressure applied. Pt denies pain at the site currently. No complaints noted.  Patient free of lines, drains, and wounds.   An After Visit Summary (AVS) was printed and given to the patient. Patient escorted via wheelchair, and discharged home via private auto.  Arlyss Repress, RN

## 2020-03-21 NOTE — Discharge Summary (Signed)
Discharge Summary  Patient ID: Heather [name] [id number] 49 y.o. Mar 19, 1971  Admit date: 03/19/2020  Discharge date and time: 03/21/20   Admitting Physician: Waynetta Sandy, MD   Discharge Physician: same  Admission Diagnoses: End stage renal disease [facility]) [N18.6]  Discharge Diagnoses: same  Admission Condition: fair  Discharged Condition: fair  Indication for Admission: End-stage renal disease with steal syndrome after placement of right arm AV graft  Hospital Course: [name] is a 49 year old female with a complicated surgical history involving dialysis access.  She was brought as an outpatient and underwent right arm AV graft placement by Dr. Donzetta Matters on 03/19/2020.  She was admitted to the hospital due to concerns for steal syndrome the right hand.  POD #1 she was still experiencing steal syndrome symptoms of right hand and underwent ligation of right arm AV graft, thrombectomy of right upper extremity, and vein patch of right brachial artery by Dr. Donzetta Matters.  Nephrology was consulted for management of end-stage renal disease on dialysis while inpatient.  Today right arm feels much better and she has a palpable right radial pulse.  She will be tunneled dialysis catheter dependent for dialysis for now.  She will be considered for left thigh AV graft in the future however currently she is healing a pressure ulcer on her lateral left foot.  She will be prescribed narcotic pain medication for continued postoperative pain control.  She will follow-up in 4 to 6 weeks to be considered for left thigh AV graft.  She will be discharged home today in stable condition.  Consults: nephrology  Treatments: surgery: Right arm AV graft placement by Dr. Donzetta Matters 03/19/2020 AV graft ligation and right upper extremity thrombectomy by Dr. Donzetta Matters on 03/20/2020  Discharge Exam: See progress note 03/21/2020 Vitals:   03/21/20 0513 03/21/20 0814  BP: (!) 107/56 121/60  Pulse: 96 (!) 102   Resp: 19 16  Temp: 100 F (37.8 C) 99.5 F (37.5 C)  SpO2: 97% 98%     Disposition: Discharge disposition: 01-Home or Self Care       Patient Instructions:  Allergies as of 03/21/2020      Reactions   Ibuprofen Other (See Comments)   CKD stage 3. Should avoid.   Dilaudid [hydromorphone Hcl] Itching, Other (See Comments)   Can take with Benadryl.   Tramadol Itching      Medication List    TAKE these medications   amLODipine 10 MG tablet Commonly known as: NORVASC Take 10 mg by mouth at bedtime.   aspirin-acetaminophen-caffeine 250-250-65 MG tablet Commonly known as: EXCEDRIN MIGRAINE Take 1 tablet by mouth daily as needed for headache.   Eliquis 5 MG Tabs tablet Generic drug: apixaban Take 5 mg by mouth 2 (two) times daily.   FreeStyle Libre 14 Day Sensor Misc 1 patch by Does not apply route every 14 (fourteen) days.   gabapentin 300 MG capsule Commonly known as: NEURONTIN Take 300 mg by mouth 2 (two) times daily as needed (for pain). What changed: Another medication with the same name was changed. Make sure you understand how and when to take each.   gabapentin 100 MG capsule Commonly known as: NEURONTIN TAKE 1 CAPSULE BY MOUTH EVERY DAY What changed:   how much to take  when to take this  reasons to take this   Insulin Pen Needle 29G X 5MM Misc 1 Device by Does not apply route 4 (four) times daily.   lisinopril 10 MG tablet Commonly known as: ZESTRIL Take  10 mg by mouth daily.   lubiprostone 24 MCG capsule Commonly known as: Amitiza Take 1 capsule (24 mcg total) by mouth 2 (two) times daily with a meal. What changed:   when to take this  reasons to take this   midodrine 5 MG tablet Commonly known as: PROAMATINE TAKE 1 TABLET (5 MG TOTAL) BY MOUTH 2 (TWO) TIMES DAILY WITH A MEAL. What changed:   when to take this  reasons to take this   NovoLOG FlexPen 100 UNIT/ML FlexPen Generic drug: insulin aspart Inject 10 Units into the skin  3 (three) times daily with meals. Sliding scale What changed: how much to take   oxyCODONE-acetaminophen 7.5-325 MG tablet Commonly known as: Percocet Take 1 tablet by mouth every 4 (four) hours as needed for severe pain.   [name] FlexTouch 100 UNIT/ML FlexTouch Pen Generic drug: insulin degludec Inject 0.26 mLs (26 Units total) into the skin at bedtime. What changed: how much to take      Activity: activity as tolerated Diet: renal diet Wound Care: keep wound clean and dry  Follow-up with Dr. Donzetta Matters in 1 month.  Signed: Dagoberto Ligas, PA-C 03/21/2020 2:30 PM [facility] Office: [phone]

## 2020-03-26 ENCOUNTER — Other Ambulatory Visit: Payer: Self-pay | Admitting: Family Medicine

## 2020-04-01 ENCOUNTER — Telehealth: Payer: Self-pay | Admitting: Vascular Surgery

## 2020-04-01 NOTE — Telephone Encounter (Signed)
[place] office faxed referral to see if the patient can be seen earlier. Advised her she has a 1 month follow up on 04/25/20 with Dr. Donzetta Matters.  She said that's fine and she will let the doctor know.

## 2020-04-04 ENCOUNTER — Encounter (HOSPITAL_BASED_OUTPATIENT_CLINIC_OR_DEPARTMENT_OTHER): Payer: Managed Care, Other (non HMO) | Attending: Internal Medicine | Admitting: Internal Medicine

## 2020-04-04 ENCOUNTER — Other Ambulatory Visit: Payer: Self-pay

## 2020-04-04 DIAGNOSIS — Z8673 Personal history of transient ischemic attack (TIA), and cerebral infarction without residual deficits: Secondary | ICD-10-CM | POA: Diagnosis not present

## 2020-04-04 DIAGNOSIS — E1122 Type 2 diabetes mellitus with diabetic chronic kidney disease: Secondary | ICD-10-CM | POA: Insufficient documentation

## 2020-04-04 DIAGNOSIS — E1142 Type 2 diabetes mellitus with diabetic polyneuropathy: Secondary | ICD-10-CM | POA: Insufficient documentation

## 2020-04-04 DIAGNOSIS — I120 Hypertensive chronic kidney disease with stage 5 chronic kidney disease or end stage renal disease: Secondary | ICD-10-CM | POA: Insufficient documentation

## 2020-04-04 DIAGNOSIS — E1151 Type 2 diabetes mellitus with diabetic peripheral angiopathy without gangrene: Secondary | ICD-10-CM | POA: Diagnosis not present

## 2020-04-04 DIAGNOSIS — Z6833 Body mass index (BMI) 33.0-33.9, adult: Secondary | ICD-10-CM | POA: Diagnosis not present

## 2020-04-04 DIAGNOSIS — Z992 Dependence on renal dialysis: Secondary | ICD-10-CM | POA: Diagnosis not present

## 2020-04-04 DIAGNOSIS — N186 End stage renal disease: Secondary | ICD-10-CM | POA: Diagnosis not present

## 2020-04-04 DIAGNOSIS — E11621 Type 2 diabetes mellitus with foot ulcer: Secondary | ICD-10-CM | POA: Insufficient documentation

## 2020-04-04 DIAGNOSIS — E669 Obesity, unspecified: Secondary | ICD-10-CM | POA: Insufficient documentation

## 2020-04-04 DIAGNOSIS — L97522 Non-pressure chronic ulcer of other part of left foot with fat layer exposed: Secondary | ICD-10-CM | POA: Insufficient documentation

## 2020-04-04 DIAGNOSIS — L97529 Non-pressure chronic ulcer of other part of left foot with unspecified severity: Secondary | ICD-10-CM | POA: Diagnosis present

## 2020-04-04 NOTE — Progress Notes (Signed)
Office Visit Note  Patient: Heather [name]             Date of Birth: 1971-02-15           MRN: [id number]             PCP: Inda Coke, PA Referring: Inda Coke, PA Visit Date: 04/14/2020 Occupation: @GUAROCC @  Subjective:  Pain in joints   History of Present Illness: Heather [name] is a 50 y.o. female seen in consultation per request of her PCP for evaluation of positive ANA.  According to patient she has had history of lower back pain for many years.  She also experiences some discomfort in her knee joints and her feet.  She denies any history of joint swelling.  She states she has some lower extremity muscle pain.  She has been using walker since her stroke in 2018.  She has 2 maternal cousins who have lupus.  Activities of Daily Living:  Patient reports morning stiffness for 24 hours.   Patient Reports nocturnal pain.  Difficulty dressing/grooming: Reports Difficulty climbing stairs: Reports Difficulty getting out of chair: Reports Difficulty using hands for taps, buttons, cutlery, and/or writing: Reports  Review of Systems  Constitutional: Positive for fatigue. Negative for night sweats, weight gain and weight loss.  HENT: Positive for mouth dryness. Negative for mouth sores, trouble swallowing, trouble swallowing and nose dryness.   Eyes: Negative for pain, redness, visual disturbance and dryness.  Respiratory: Positive for shortness of breath. Negative for cough and difficulty breathing.   Cardiovascular: Negative for chest pain, palpitations, hypertension, irregular heartbeat and swelling in legs/feet.  Gastrointestinal: Positive for constipation. Negative for blood in stool and diarrhea.  Endocrine: Negative for cold intolerance, excessive thirst and increased urination.  Genitourinary: Positive for difficulty urinating. Negative for vaginal dryness.  Musculoskeletal: Positive for arthralgias, gait problem, joint pain, muscle weakness and morning  stiffness. Negative for joint swelling, myalgias, muscle tenderness and myalgias.  Skin: Negative for color change, rash, hair loss, skin tightness, ulcers and sensitivity to sunlight.  Allergic/Immunologic: Negative for susceptible to infections.  Neurological: Positive for weakness. Negative for dizziness, numbness, memory loss and night sweats.  Hematological: Negative for bruising/bleeding tendency and swollen glands.  Psychiatric/Behavioral: Positive for sleep disturbance. Negative for depressed mood. The patient is not nervous/anxious.     PMFS History:  Patient Active Problem List   Diagnosis Date Noted  . End stage renal disease ([place]) 03/19/2020  . Foot ulcer ([place]) 02/11/2020  . Dyslipidemia 12/26/2019  . Pericardial effusion 06/14/2019  . Unspecified protein-calorie malnutrition ([place]) 06/12/2019  . Coagulation defect, unspecified ([place]) 06/04/2019  . Hypokalemia 06/04/2019  . PD catheter dysfunction ([place]) 05/31/2019  . ESRD on peritoneal dialysis ([place]) 04/25/2019  . Neuropathic pain   . Renovascular hypertension   . ESRD on dialysis ([place])   . Bacterial peritonitis ([place])   . Debility 02/13/2019  . Left ovarian cyst 02/05/2019  . Hemiplegia and hemiparesis following cerebral infarction affecting left non-dominant side ([place]) 12/29/2018  . Peritoneal dialysis catheter in place [facility]) 11/14/2018  . Pre-transplant evaluation for kidney transplant 11/07/2018  . Grade I diastolic dysfunction 72/08/4708  . Arteriovenous fistula ([place]) 07/21/2018  . History of cerebrovascular accident (CVA) involving cerebellum 07/21/2018  . Dysmenorrhea 01/12/2018  . Insomnia 01/12/2018  . Menorrhagia 01/12/2018  . Vitamin D deficiency 01/02/2018  . Bilateral lower extremity edema 01/02/2018  . Reactive depression 12/10/2017  . Diabetic ulcer of both feet ([place]) 08/06/2017  . IBS (irritable bowel syndrome)  08/06/2017  . Antiplatelet or antithrombotic long-term use: Plavix 06/30/2017  . Morbid  obesity due to excess calories ([place]) 11/16/2016  . Upper airway cough syndrome 11/15/2016  . Dyslipidemia associated with type 2 diabetes mellitus ([place]) 06/25/2016  . 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 Uncontrolled type 2 diabetes mellitus with diabetic polyneuropathy, with long-term current use of insulin ([place]) 03/08/2016  . Proliferative diabetic retinopathy ([place]) 09/29/2015  . Constipation 09/03/2015  . Pseudophakia of both eyes 05/30/2015  . 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 B12 deficiency 05/10/2014  . Anemia in chronic kidney disease 04/26/2014  . CKD (chronic kidney disease) stage 5, GFR less than 15 ml/min (HCC) 04/26/2014  . Secondary hyperparathyroidism of renal origin ([place]) 11/27/2013  . Posterior subcapsular cataract, bilateral 10/18/2013  . Diabetic neuropathy, painful ([place]), on low dose Gabapentin 07/13/2013  . Diabetes mellitus, type II, insulin dependent ([place]), on CGM, with end organ damage: renal, neuropathy, gastroparesis, retinopathy 02/19/2011  . Hypertension associated with diabetes ([place]) 02/19/2011  . Gastroparesis due to DM ([place]) 02/19/2011    Past Medical History:  Diagnosis Date  . Antiplatelet or antithrombotic long-term use: Plavix 06/30/2017  . B12 deficiency 05/10/2014  . Blood transfusion without reported diagnosis   . Chronic constipation   . CVA (cerebral vascular accident) ([place]), nonhemorrhagic, inferior right cerebellum 12/03/2017   left side weakness in leg  . Cyst of left ovary 01/12/2018  . Diabetic neuropathy, painful ([place]), on low dose Gabapentin 07/13/2013  . DM (diabetes mellitus), type 2, uncontrolled, with renal complications ([place]) 16/96/7893  . DM2 (diabetes mellitus, type 2) ([place])   . Dysfunction of left eustachian tube, with pusatile tinnitus 11/24/2017  . Dyslipidemia associated with type 2 diabetes  mellitus ([place]) 06/25/2016  . ESRD (end stage renal disease) on dialysis [facility])    On hemodialysis in July 2019 via [facility] then switched to [facility] in Nov 2019.   [name] ESRD with anemia ([place])   . Fibromyalgia   . Gastroparesis due to DM   . GERD (gastroesophageal reflux disease)   . Headache   . Hypertension associated with diabetes ([place]) 02/19/2011  . IBS (irritable bowel syndrome)   . Left-sided weakness 12/10/2017   .  [name] Leiomyoma of uterus   . Pancreatitis   . PE (pulmonary thromboembolism) ([place]) 11/04/2019  . Pneumonia   . Proliferative diabetic retinopathy ([place]) 09/29/2015  . 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 Reactive depression 12/10/2017   .  [name] Right-sided low back pain without sciatica 12/08/2015  . Secondary hyperthyroidism 11/27/2013  . Steal syndrome dialysis vascular access ([place]) 02/17/2018  . Thromboembolism ([place]) 04/05/2018  . Upper airway cough syndrome, with recs to stay off ACE and take Pepcid q hs 11/15/2016    Family History  Problem Relation Age of Onset  . Colon polyps Mother   . Diabetes Mother   . Heart murmur Father        history essentially unknown  . Hypertension Father   . Diabetes Sister   . Kidney failure Sister        kidney transplant  . Diabetes Brother   . Retinal degeneration Brother   . Heart murmur Son   . Stroke Paternal Grandmother   . Diabetes Sister    Past Surgical History:  Procedure Laterality Date  . A/V FISTULAGRAM Right 03/03/2020   Procedure: Venogram;  Surgeon: Waynetta Sandy,  MD;  Location: [place] [facility];  Service: Cardiovascular;  Laterality: Right;  . ARTERY REPAIR Right 02/17/2018   Procedure: EXPLORATION OF RIGHT BRACHIAL ARTERY;  Surgeon: Conrad [place], MD;  Location: [place];  Service: Vascular;  Laterality: Right;  . ARTERY REPAIR Right 02/17/2018   Procedure: BRACHIAL ARTERY EXPLORATION AND TRHOMBECTOMY;  Surgeon: Conrad [place], MD;  [facility] OR;  Service: Vascular;  Laterality: Right;  . AV FISTULA  PLACEMENT Left 05/09/2017   Procedure: INSERTION OF ARTERIOVENOUS (AV) GRAFT ARM (ARTEGRAFT);  Surgeon: Conrad [place], MD;  Location: [facility] OR;  Service: Vascular;  Laterality: Left;  . AV FISTULA PLACEMENT Right 02/17/2018   Procedure: INSERTION OF ARTERIOVENOUS (AV) GORE-TEX GRAFT ARM RIGHT UPPER ARM;  Surgeon: Conrad Kalihiwai, MD;  Location: [place];  Service: Vascular;  Laterality: Right;  . AV FISTULA PLACEMENT Left 10/10/2019   Procedure: INSERTION OF LEFT ARTERIOVENOUS (AV) ARTEGRAFT GRAFT ARM;  Surgeon: Waynetta Sandy, MD;  Location: [place];  Service: Vascular;  Laterality: Left;  . AV FISTULA PLACEMENT Right 03/19/2020   Procedure: INSERTION OF ARTERIOVENOUS (AV) GORE-TEX GRAFT IN RIGHT ARM AND VIABAHN STENT IN RIGHT AXILLARY ARTERY;  Surgeon: Waynetta Sandy, MD;  Location: [place];  Service: Vascular;  Laterality: Right;  . BASCILIC VEIN TRANSPOSITION Left 08/31/2016   Procedure: BASILIC VEIN TRANSPOSITION FIRST STAGE;  Surgeon: Conrad Overlea, MD;  Location: [place];  Service: Vascular;  Laterality: Left;  . CENTRAL VENOUS CATHETER INSERTION Left 07/24/2018   Procedure: INSERTION CENTRAL LINE ADULT;  Surgeon: Conrad Apollo, MD;  Location: [place];  Service: Vascular;  Laterality: Left;  . EYE SURGERY     secondary to diabetic retinopathy   . FOOT SURGERY Right    t"ook bone out- maybe hammer toe"  . INSERTION OF DIALYSIS CATHETER Left 07/24/2018   Procedure: INSERTION OF TUNNELED DIALYSIS CATHETER;  Surgeon: Conrad [place], MD;  Location: [place];  Service: Vascular;  Laterality: Left;  . IR FLUORO GUIDE CV LINE RIGHT  07/21/2018  . IR FLUORO GUIDE CV LINE RIGHT  06/01/2019  . IR US GUIDE VASC ACCESS RIGHT  07/21/2018  . IR US GUIDE VASC ACCESS RIGHT  06/01/2019  . LIGATION ARTERIOVENOUS GORTEX GRAFT Right 03/20/2020   Procedure: LIGATION ARM ARTERIOVENOUS GORTEX GRAFT With Patch Angioplasty, Thrombectomy;  Surgeon: Waynetta Sandy, MD;  Location: [place];  Service: Vascular;   Laterality: Right;  . LIGATION OF ARTERIOVENOUS  FISTULA Left 05/09/2017   Procedure: LIGATION OF ARTERIOVENOUS  FISTULA;  Surgeon: Conrad [place], MD;  Location: [place];  Service: Vascular;  Laterality: Left;  . LOOP RECORDER INSERTION N/A 12/05/2017   Procedure: LOOP RECORDER INSERTION;  Surgeon: Evans Lance, MD;  Location: [place] [facility];  Service: Cardiovascular;  Laterality: N/A;  . MYOMECTOMY    . REMOVAL OF GRAFT Right 02/17/2018   Procedure: REMOVAL OF RIGHT UPPER ARM ARTERIOVENOUS GRAFT;  Surgeon: Conrad [place], MD;  Location: [place];  Service: Vascular;  Laterality: Right;  . REVISION OF ARTERIOVENOUS GORETEX GRAFT Left 07/24/2018   Procedure: REDO ARTERIOVENOUS GORETEX GRAFT;  Surgeon: Conrad [place]arrison, MD;  Location: [place];  Service: Vascular;  Laterality: Left;  . TEE WITHOUT CARDIOVERSION N/A 12/05/2017   Procedure: TRANSESOPHAGEAL ECHOCARDIOGRAM (TEE);  Surgeon: Sanda Klein, MD;  Location: [place];  Service: Cardiovascular;  Laterality: N/A;  . UPPER EXTREMITY VENOGRAPHY Left 07/13/2018   Procedure: UPPER EXTREMITY VENOGRAPHY - Central & Left Arm;  Surgeon: [name],  Jannette Fogo, MD;  Location: [place] [facility];  Service: Cardiovascular;  Laterality: Left;  . UPPER EXTREMITY VENOGRAPHY Bilateral 09/10/2019   Procedure: UPPER EXTREMITY VENOGRAPHY;  Surgeon: Waynetta Sandy, MD;  Location: [place] [facility];  Service: Cardiovascular;  Laterality: Bilateral;  . UTERINE FIBROID SURGERY    . VITRECTOMY Bilateral    Social History   Social History Narrative   Patient lives with fiance. She has 1 grown son. Works full-time, she Paramedic for attorney.   Caffeine Use: 2 cups daily; sodas occasionally   She has 7 grandchildren   Immunization History  Administered Date(s) Administered  . Influenza Inj Mdck Quad Pf 09/03/2016  . Influenza, Seasonal, Injecte, Preservative Fre 09/28/2018  . Influenza,inj,Quad PF,6+ Mos 09/20/2013, 09/11/2014, 09/26/2016  .  Influenza-Unspecified 03/27/2013, 09/03/2016  . Pneumococcal Conjugate-13 12/24/2016  . Pneumococcal Polysaccharide-23 04/26/2014, 09/11/2014  . Tdap 09/20/2013     Objective: Vital Signs: BP 121/69 (BP Location: Left Arm, Patient Position: Sitting, Cuff Size: Normal)   Pulse 94   Resp 16   Ht 5\' 8"  (1.727 m)   Wt 223 lb (101.2 kg)   BMI 33.91 kg/m    Physical Exam Vitals and nursing note reviewed.  Constitutional:      Appearance: [name] well-developed.  HENT:     Head: Normocephalic and atraumatic.  Eyes:     Conjunctiva/sclera: Conjunctivae normal.  Cardiovascular:     Rate and Rhythm: Normal rate and regular rhythm.     Heart sounds: Normal heart sounds.  Pulmonary:     Effort: Pulmonary effort is normal.     Breath sounds: Normal breath sounds.  Abdominal:     General: Bowel sounds are normal.     Palpations: Abdomen is soft.  Musculoskeletal:     Cervical back: Normal range of motion.  Lymphadenopathy:     Cervical: No cervical adenopathy.  Skin:    General: Skin is warm and dry.     Capillary Refill: Capillary refill takes less than 2 seconds.  Neurological:     Mental Status: [name] alert and oriented to person, place, and time.  Psychiatric:        Behavior: Behavior normal.      Musculoskeletal Exam: C-spine was in good range of motion.  She has some discomfort with range of motion of lumbar spine which was difficult to assess due to her previous stroke and limited mobility.  Shoulder joints and elbow joints in good range of motion.  She had no synovitis over MCPs PIPs or DIPs.  Hip joints were difficult to assess.  Her knee joints with good range of motion.  She did not have much mobility in her MTPs but no synovitis was noted.  Ankle joints were also limited range of motion.  No synovitis was noted.  CDAI Exam: CDAI Score: -- Patient Global: --; Provider Global: -- Swollen: --; Tender: -- Joint Exam 04/14/2020   No joint exam has been documented for  this visit   There is currently no information documented on the homunculus. Go to the Rheumatology activity and complete the homunculus joint exam.  Investigation: No additional findings.  Imaging: DG Ang/Ext/Uni/Or Right  Result Date: 03/19/2020 CLINICAL DATA:  End-stage renal disease. EXAM: INTRAOPERATIVE ANGIOGRAM TECHNIQUE: Angiographic images were submitted for interpretation post-operatively. Please see the procedural report for the amount of contrast and the fluoroscopy time utilized. COMPARISON:  None. FINDINGS: Two intraoperative angiogram images were obtained. There is a patent upper arm graft. First image  demonstrates some irregularity and narrowing of the outflow vein and venous anastomosis. Second image demonstrates a wire extending through the graft and into the outflow vein. There is improved flow at the venous anastomosis and suspect interval angioplasty in this area. IMPRESSION: Intraoperative images of an upper arm AV graft. Refer to the operative report for procedure details. Electronically Signed   By: Markus Daft M.D.   On: 03/19/2020 09:34    Recent Labs: Lab Results  Component Value Date   WBC 9.8 03/20/2020   HGB 11.6 (L) 03/20/2020   PLT 134 (L) 03/20/2020   NA 136 03/20/2020   K 3.8 03/20/2020   CL 97 (L) 03/20/2020   CO2 21 (L) 03/20/2020   GLUCOSE 117 (H) 03/20/2020   BUN 14 03/20/2020   CREATININE 5.20 (H) 03/20/2020   BILITOT QUANTITY NOT SUFFICIENT, UNABLE TO PERFORM TEST 03/20/2020   ALKPHOS 85 03/20/2020   AST 19 03/20/2020   ALT 22 03/20/2020   PROT 7.2 03/20/2020   ALBUMIN 2.9 (L) 03/20/2020   CALCIUM 9.0 03/20/2020   GFRAA 5 (L) 03/20/2020    Speciality Comments: No specialty comments available.  Procedures:  No procedures performed Allergies: Ibuprofen, Dilaudid [hydromorphone hcl], and Tramadol   Assessment / Plan:     Visit Diagnoses: Positive ANA (antinuclear antibody)-patient had positive A[facility] in November at [facility].   It was 1: 320 homogeneous pattern.  She has no clinical features of lupus.  Although she is concerned because she has chronic joint pain and also has family history of lupus.  I will obtainAVISE labs today.  There is no history of oral ulcers, nasal ulcers, malar rash, photosensitivity, Raynaud's, inflammatory arthritis or lymphadenopathy.  Lower back pain-patient has history of lower back pain for many years.  She states she has had x-rays and evaluation in the past and was diagnosed with scoliosis.  Pain in both knees-no warmth swelling or effusion was noted.  I offered x-rays but she declined.  Pain in both feet-patient states she has discomfort in her feet due to neuropathy.  She denies any joint swelling.  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, with long-term current use of insulin ([place])  History of pulmonary embolism  History of gastroesophageal reflux (GERD)  History of IBS  History of diabetic gastroparesis  History of pancreatitis  Hemiplegia and hemiparesis following cerebral infarction affecting left non-dominant side (HCC)  Renovascular hypertension  Pericardial effusion  CKD (chronic kidney disease) stage 5, GFR less than 15 ml/min (HCC)  Peritoneal dialysis catheter in place ([place])  Dyslipidemia  Vitamin D deficiency  History of depression  Family history of systemic lupus erythematosus - In 2 maternal cousins per patient  Orders: No orders of the defined types were placed in this encounter.  No orders of the defined types were placed in this encounter.   Follow-Up Instructions: Return for Positive ANA.   Bo Merino, MD  Note - This record has been created using Editor, commissioning.  Chart creation errors have been sought, but may not always  have been located. Such creation errors do  not reflect on  the standard of medical care.

## 2020-04-07 NOTE — Progress Notes (Addendum)
FAELYN, SIGLER ([id number]) Visit Report for 04/04/2020 Arrival Information Details Patient Name: Date of Service: Heather [name], Heather [name] 04/04/2020 8:00 AM Medical Record [id number] Patient Account Number: [id number] Date of Birth/Sex: Treating RN: [birth year] (49 y.o. [name] Primary Care Dravin Lance: Inda Coke Other Clinician: Referring Tamar Lipscomb: Treating Talbert Trembath/Extender:Robson, Annice Needy, Calton Golds in Treatment: 6 Visit Information History Since Last Visit Added or deleted any medications: No Patient Arrived: Ambulatory Any new allergies or adverse reactions: No Arrival Time: 07:58 Had a fall or experienced change in No Accompanied By: self activities of daily living that may affect Transfer Assistance: None risk of falls: Patient Identification Verified: Yes Signs or symptoms of abuse/neglect since last No Secondary Verification Process Yes visito Completed: Hospitalized since last visit: Yes Patient Requires Transmission-Based No Implantable device outside of the clinic excluding No Precautions: cellular tissue based products placed in the center Patient Has Alerts: Yes since last visit: Patient Alerts: Patient on Blood Has Dressing in Place as Prescribed: No Thinner Pain Present Now: No ABI left .94 ABI right .73 Electronic Signature(s) Signed: 04/04/2020 5:55:58 PM By: Deon Pilling Entered By: Deon Pilling on 04/04/2020 08:01:51 -------------------------------------------------------------------------------- Clinic Level of Care Assessment Details Patient Name: Date of Service: Heather [name], Heather [name] 04/04/2020 8:00 AM Medical Record [id number] Patient Account Number: [id number] Date of Birth/Sex: Treating RN: July 14, 1971 (49 y.o. Clearnce Sorrel Primary Care Janequa Kipnis: Inda Coke Other Clinician: Referring Sienna Stonehocker: Treating Meztli Llanas/Extender:Robson, Annice Needy, Calton Golds in Treatment: 6 Clinic  Level of Care Assessment Items TOOL 4 Quantity Score X - Use when only an EandM is performed on FOLLOW-UP visit 1 0 ASSESSMENTS - Nursing Assessment / Reassessment X - Reassessment of Co-morbidities (includes updates in patient status) 1 10 X - Reassessment of Adherence to Treatment Plan 1 5 ASSESSMENTS - Wound and Skin Assessment / Reassessment X - Simple Wound Assessment / Reassessment - one wound 1 5 []  - Complex Wound Assessment / Reassessment - multiple wounds 0 []  - Dermatologic / Skin Assessment (not related to wound area) 0 ASSESSMENTS - Focused Assessment X - Circumferential Edema Measurements - multi extremities 1 5 []  - Nutritional Assessment / Counseling / Intervention 0 []  - Lower Extremity Assessment (monofilament, tuning fork, pulses) 0 []  - Peripheral Arterial Disease Assessment (using hand held doppler) 0 ASSESSMENTS - Ostomy and/or Continence Assessment and Care []  - Incontinence Assessment and Management 0 []  - Ostomy Care Assessment and Management (repouching, etc.) 0 PROCESS - Coordination of Care X - Simple Patient / Family Education for ongoing care 1 15 []  - Complex (extensive) Patient / Family Education for ongoing care 0 X - Staff obtains Programmer, systems, Records, Test Results / Process Orders 1 10 []  - Staff telephones HHA, Nursing Homes / Clarify orders / etc 0 []  - Routine Transfer to another Facility (non-emergent condition) 0 []  - Routine Hospital Admission (non-emergent condition) 0 []  - New Admissions / Biomedical engineer / Ordering NPWT, Apligraf, etc. 0 []  - Emergency Hospital Admission (emergent condition) 0 X - Simple Discharge Coordination 1 10 []  - Complex (extensive) Discharge Coordination 0 PROCESS - Special Needs []  - Pediatric / Minor Patient Management 0 []  - Isolation Patient Management 0 []  - Hearing / Language / Visual special needs 0 []  - Assessment of Community assistance (transportation, D/C planning, etc.) 0 []  - Additional  assistance / Altered mentation 0 []  - Support Surface(s) Assessment (bed, cushion, seat, etc.) 0 INTERVENTIONS - Wound Cleansing / Measurement X - Simple Wound Cleansing - one wound 1 5 []  -  Complex Wound Cleansing - multiple wounds 0 X - Wound Imaging (photographs - any number of wounds) 1 5 []  - Wound Tracing (instead of photographs) 0 X - Simple Wound Measurement - one wound 1 5 []  - Complex Wound Measurement - multiple wounds 0 INTERVENTIONS - Wound Dressings []  - Small Wound Dressing one or multiple wounds 0 []  - Medium Wound Dressing one or multiple wounds 0 []  - Large Wound Dressing one or multiple wounds 0 []  - Application of Medications - topical 0 []  - Application of Medications - injection 0 INTERVENTIONS - Miscellaneous []  - External ear exam 0 []  - Specimen Collection (cultures, biopsies, blood, body fluids, etc.) 0 []  - Specimen(s) / Culture(s) sent or taken to Lab for analysis 0 []  - Patient Transfer (multiple staff / Civil Service fast streamer / Similar devices) 0 []  - Simple Staple / Suture removal (25 or less) 0 []  - Complex Staple / Suture removal (26 or more) 0 []  - Hypo / Hyperglycemic Management (close monitor of Blood Glucose) 0 []  - Ankle / Brachial Index (ABI) - do not check if billed separately 0 X - Vital Signs 1 5 Has the patient been seen at the hospital within the last three years: Yes Total Score: 80 Level Of Care: New/Established - Level 3 Electronic Signature(s) Signed: 04/04/2020 6:04:52 PM By: Kela Millin Entered By: Kela Millin on 04/04/2020 08:44:54 -------------------------------------------------------------------------------- Lower Extremity Assessment Details Patient Name: Date of Service: Heather [name]. 04/04/2020 8:00 AM Medical Record [id number] Patient Account Number: [id number] Date of Birth/Sex: Treating RN: [birth year] (49 y.o. [name] Primary Care Soul Hackman: Inda Coke Other Clinician: Referring Kem Parcher:  Treating Ciclaly Mulcahey/Extender:Robson, Annice Needy, Calton Golds in Treatment: 6 Edema Assessment Assessed: [Left: Yes] [Right: No] Edema: [Left: N] [Right: o] Calf Left: Right: Point of Measurement: 40 cm From Medial Instep 36 cm cm Ankle Left: Right: Point of Measurement: 10 cm From Medial Instep 20.5 cm cm Electronic Signature(s) Signed: 04/04/2020 5:55:58 PM By: Deon Pilling Entered By: Deon Pilling on 04/04/2020 08:05:53 -------------------------------------------------------------------------------- Multi Wound Chart Details Patient Name: Date of Service: Heather [name]. 04/04/2020 8:00 AM Medical Record [id number] Patient Account Number: [id number] Date of Birth/Sex: Treating RN: 1971-03-30 (49 y.o. 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] Comorbid History: [4:Anemia, Hypertension, TypeN/A II Diabetes, Neuropathy] Date Acquired: [4:12/28/2019] [N/A:N/A] Weeks of Treatment: [4:6] [N/A:N/A] Wound Status: [4:Healed - Epithelialized] [N/A:N/A] Measurements L x W x D 0x0x0 [N/A:N/A] (cm) Area (cm) : [4:0] [N/A:N/A] Volume (cm) : [4:0] [N/A:N/A] % Reduction in Area: [4:100.00%] [N/A:N/A] % Reduction in Volume: 100.00% [N/A:N/A] Classification: [4:Grade 2] [N/A:N/A] Exudate Amount: [4:None Present] [N/A:N/A] Wound Margin: [4:Flat and Intact] [N/A:N/A] Granulation Amount: [4:None Present (0%)] [N/A:N/A] Necrotic  Amount: [4:None Present (0%)] [N/A:N/A] Exposed Structures: [4:Fat Layer (Subcutaneous N/A Tissue) Exposed: Yes Fascia: No Tendon: No Muscle: No Joint: No Bone: No] Epithelialization: [4:Large (67-100%)] [N/A:N/A] Assessment Notes: [4:callus noted to wound and N/A periwound.] Treatment Notes Electronic Signature(s) Signed: 04/04/2020 6:04:52 PM By: Kela Millin Signed: 04/07/2020 9:07:11 AM By: Linton Ham MD Entered By: Linton Ham on 04/04/2020 08:44:01 -------------------------------------------------------------------------------- Multi-Disciplinary Care Plan Details Patient Name: Date of Service: Heather Medicus K. 04/04/2020  8:00 AM Medical Record [id number] Patient Account Number: [id number] Date of Birth/Sex: Treating RN: Apr 26, 1971 (49 y.o. Clearnce Sorrel Primary Care Pennie Vanblarcom: Inda Coke Other Clinician: Referring Cherl Gorney: Treating Kashden Deboy/Extender:Robson, Annice Needy, Calton Golds in Treatment: 6 Active Inactive Electronic Signature(s) Signed: 04/04/2020 6:04:52 PM By: Kela Millin Entered By: Kela Millin on 04/04/2020 08:44:29 -------------------------------------------------------------------------------- Pain Assessment Details Patient Name: Date of Service: Heather [name], Heather [name] 04/04/2020 8:00 AM Medical Record [id number] Patient Account Number: [id number] Date of Birth/Sex: Treating RN: [birth year] (49 y.o. [name] Primary Care [place] Antolin: Inda Coke Other Clinician: Referring Wynton Hufstetler: Treating Peaches Vanoverbeke/Extender:Robson, Annice Needy, Calton Golds in Treatment: 6 Active Problems Location of Pain Severity and Description of Pain Patient Has Paino No Site Locations Rate the pain. Current Pain Level: 0 Pain Management and Medication Current Pain Management: Medication: No Cold Application: No Rest: No Massage: No Activity: No T.E.N.S.: No Heat Application: No Leg drop or elevation:  No Is the Current Pain Management Adequate: Adequate How does your wound impact your activities of daily livingo Sleep: No Bathing: No Appetite: No Relationship With Others: No Bladder Continence: No Emotions: No Bowel Continence: No Work: No Toileting: No Drive: No Dressing: No Hobbies: No Electronic Signature(s) Signed: 04/04/2020 5:55:58 PM By: Deon Pilling Entered By: Deon Pilling on 04/04/2020 08:05:43 -------------------------------------------------------------------------------- Patient/Caregiver Education Details SCOTT-[place], DGUYQI 4/9/2021andnbsp8:00 Patient Name: Date of Service: K. AM Medical Record Patient Account Number: [id number] Number: Treating RN: Kela Millin Date of Birth/Gender: February 13, 1971 (49 y.o. F) Other Clinician: Primary Care Physician: Inda Coke Treating Linton Ham Referring Physician: Physician/Extender: Gaspar Garbe in Treatment: 6 Education Assessment Education Provided To: Patient Education Topics Provided Elevated Blood Sugar/ Impact on Healing: Methods: Explain/Verbal Responses: State content correctly Nutrition: Methods: Explain/Verbal Responses: State content correctly Wound/Skin Impairment: Methods: Explain/Verbal Responses: State content correctly Electronic Signature(s) Signed: 04/04/2020 6:04:52 PM By: Kela Millin Entered By: Kela Millin on 04/04/2020 07:58:41 -------------------------------------------------------------------------------- Wound Assessment Details Patient Name: Date of Service: Heather [name]. 04/04/2020 8:00 AM Medical Record [id number] Patient Account Number: [id number] Date of Birth/Sex: Treating RN: 30-Dec-1970 (49 y.o. Clearnce Sorrel Primary Care Leonna Schlee: Inda Coke Other Clinician: Referring Derrek Puff: Treating Vonzell Lindblad/Extender:Robson, Annice Needy, Calton Golds in Treatment: 6 Wound Status Wound Number: 4 Primary  Diabetic Wound/Ulcer of the Lower Etiology: Extremity Wound Location: Left, Lateral Foot Wound Healed - Epithelialized Wounding Event: Gradually Appeared Status: Status: Date Acquired: 12/28/2019 Comorbid Anemia, Hypertension, Type II Diabetes, Weeks Of Treatment: 6 History: Neuropathy Clustered Wound: No Photos Wound Measurements Length: (cm) 0 % Reducti Width: (cm) 0 % Reducti Depth: (cm) 0 Epithelia Area: (cm) 0 Tunnelin Volume: (cm) 0 Undermin Wound Description Classification: Grade 2 Foul Odor Wound Margin: Flat and Intact Slough/Fi Exudate Amount: None Present Wound Bed Granulation Amount: None Present (0%) Necrotic Amount: None Present (0%) Fascia Ex Fat Layer Tendon Ex Muscle Ex Joint Exp Bone Expo After Cleansing: No brino No Exposed Structure posed: No (Subcutaneous Tissue) Exposed: Yes posed: No posed: No osed: No sed: No on in Area: 100% on in Volume: 100% lization: Large (67-100%) g: No ing: No Assessment Notes callus noted to wound and periwound. Electronic Signature(s) Signed: 04/07/2020 4:10:46 PM By: Mikeal Hawthorne EMT/HBOT Signed: 04/07/2020 5:40:42 PM By: Kela Millin Previous Signature: 04/04/2020 6:04:52 PM Version By: Kela Millin Entered By: Mikeal Hawthorne on 04/07/2020 15:00:50 -------------------------------------------------------------------------------- Vitals Details Patient Name: Date of Service: Heather [name]. 04/04/2020 8:00 AM Medical Record [id number] Patient Account Number: [id number] Date of Birth/Sex: Treating RN: Mar 08, 1971 (49 y.o. [name] Primary Care Sharnice Bosler:  Inda Coke Other Clinician: Referring Tayla Panozzo: Treating Shiah Berhow/Extender:Robson, Annice Needy, Calton Golds in Treatment: 6 Vital Signs Time Taken: 08:01 Temperature (F): 97.9 Height (in): 69 Pulse (bpm): 89 Weight (lbs): 225 Respiratory Rate (breaths/min): 18 Body Mass Index (BMI): 33.2 Blood Pressure  (mmHg): 136/59 Capillary Blood Glucose (mg/dl): 188 Reference Range: 80 - 120 mg / dl Electronic Signature(s) Signed: 04/04/2020 5:55:58 PM By: Deon Pilling Entered By: Deon Pilling on 04/04/2020 08:05:32

## 2020-04-07 NOTE — Progress Notes (Signed)
Heather [name] ([id number]) Visit Report for 04/04/2020 HPI Details Patient Name: Heather [name], Heather [name]. Date of Service: 04/04/2020 8:00 AM Medical Record [id number] Patient Account Number: [id number] Date of Birth/Sex: Treating RN: 1971-06-15 (49 y.o. Clearnce Sorrel Primary Care Provider: Inda Coke Other Clinician: Referring Provider: Treating Provider/Extender:Mavrick Mcquigg, Annice Needy, Calton Golds in Treatment: 6 History of Present Illness HPI Description: 12/10/16; Mrs. Heather [name] is a 49 year old type II diabetic with polyneuropathy and stage IV chronic renal failure. 2 weeks ago she noted that she had a burn on the right foot. She suffered this having her foot to close to a heater at work although she did not realize this until she got home secondary to neuropathy. She has not been putting anything specific on the wound. We apparently cared for her in 2015 with a wound on her left plantar foot. This required a total contact cast. She had not had any subsequent wound issues. He does not have diabetic foot wear. ABI in this clinic was 1.01 on the right 12/16/16; wound is smaller although the patient still describes some drainage using silver alginate changed to Prisma today. 12/31/16 wound is smaller although still a difficult debrided here in surface. Switched from [place] to [place] 01/10/17; patient has a new been area on the right lateral malleolus. This was previously darker skin I wonder if she also burned this at some point and we just didn't recognize it. Using Iodoflex to the lateral foot 01/17/17; patient was in the ER on Saturday with increased right foot pain. They did an x-ray of this that showed no fracture or subluxation. She had soft tissue swelling on the lateral foot as a result of the wound. There was no evidence of bony erosion or destruction to suggest osteomyelitis. They put her on doxycycline 100 twice a day for 7 days. She arrives today with the  wound still not in very good condition. Necrotic tissue debrided with a #3 curet. The area on the lateral ankle looks stable 01/27/17 improved today with Santyl. Lateral foot wound is just about closed her original burn injury much better after debridement healthy-looking granulation 02/03/17; lateral foot wound still is open with a nonviable surface. The area over the lateral malleolus also was not closed. There is no evidence of surrounding infection. No evidence of ongoing ischemia 02/10/2017 - patient is here today for evaluation of her right lateral foot and right lateral malleolus ulcerations. She states she's had no issues or concerns over the past week. She states that her blood sugars have consistently been greater than 200, closer to the 250 range. She states that she saw her PCP last week who manages her diabetes, with no change in her current treatment plan. She has been intimately compliant with wearing a surgical shoe, although the surgical shoe may be causing undue friction and/or pressure to her lateral malleolus. 02/17/17; right lateral foot and right lateral malleolus ulcerations. These were initially burn issues. We have been using Hydrofera Blue 2/29/18; the right lateral malleolus remains closed. Right lateral foot wound is slightly smaller with some depth. Using [facility] 03/03/17 the right lateral malleolus wound remains closed right lateral leg had some necrotic surface material. Using Hydrofera Blue. Change to Silver Collagen 03/10/17; arrives today with complaints of pain on the right lateral foot. Some purulent material noted by her intake nurse. We'll use silver collagen last week 03/17/17; right lateral foot wound has closed. Culture last week staph and strep she is completed doxycycline. The area looks considerably better.  READMISSION 02/18/2020 Mrs. Heather [name] is now a 49 year old woman who we had in the clinic last in 2018 with a burn on her right lateral foot and her  right lateral malleolus we eventually close this out. [name] also previously been here in 2015 and 2017. She has type 2 diabetes with peripheral neuropathy. She also has now chronic renal failure on dialysis Tuesday Thursday Saturday. [name] referred here brought by her primary provider for wounds on the mirror image side of her lateral foot left greater than right. She has had a reasonably nice work-up at primary care which is included an ABI done on 02/01/2020 [see below] also an x-ray of the left foot that was normal. She also has an MRI but all she does not currently have an appointment. She tells [name] that she has had the wound on her bilateral feet for 2 months. Noticed by her husband. She has had a round of Augmentin and is currently on doxycycline she has been using bacitracin. Past medical history; type 2 diabetes with peripheral neuropathy and PAD, chronic renal failure on dialysis. She had a CVA fibromyalgia, was treated aggressively for aortitis and is currently following this with vascular in [facility]. 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 ABIs on 02/01/2020 showed an ABI of 0.73 on the right and 0.94 on the left. TBI's were normal at 0.86 and 0.73. Waveforms on the right were biphasic and on the left triphasic. 3/1; the area on the right lateral foot seems to have closed over. Left foot smaller by about 2 mm however nonviable tissue around the wound. She says she has been using her surgical shoes although she came in here with slipper- like booties 3/8; right lateral foot still is closed although there is some callus. Left foot is smaller callus around the edges some debris on the surface wound 4/9; right lateral foot is closed left foot copious amounts of callus I removed some of this with a #10 scalpel there is no open wound here. She obviously Eberts at the ankle when she walks. It came out today that she actually has custom  shoes with inserts apparently made it biotech. I did not know anything about this. She comes in every time in slippers. Electronic Signature(s) Signed: 04/07/2020 9:07:11 AM By: Linton Ham MD Entered By: Linton Ham on 04/04/2020 08:45:26 -------------------------------------------------------------------------------- Physical Exam Details Patient Name: [name]. Date of Service: 04/04/2020 8:00 AM Medical Record [id number] Patient Account Number: [id number] Date of Birth/Sex: Treating RN: January 12, 1971 (49 y.o. Clearnce Sorrel Primary Care Provider: Inda Coke Other Clinician: Referring Provider: Treating Provider/Extender:Kinberly Perris, Annice Needy, Calton Golds in Treatment: 6 Constitutional Patient is hypotensive.. Pulse regular and within target range for patient.[name] Respirations regular, non-labored and within target range.. Temperature is normal and within the target range for the patient.[name] Appears in no distress. Respiratory work of breathing is normal. Cardiovascular Pedal pulses are palpable. Integumentary (Hair, Skin) No erythema around any wound site. Psychiatric appears at normal baseline. Notes Wound exam; the patient came into clinic with an open wound on the left lateral foot also the right lateral foot mirror- image wounds at the base of the fifth metatarsals. Both of these are closed. She still has callus in both wound areas left greater than right. I removed some of the callus on the left side I could not identify any open wound. Electronic Signature(s) Signed: 04/07/2020 9:07:11 AM By: Linton Ham MD Entered By: Linton Ham on 04/04/2020  98:33:82 -------------------------------------------------------------------------------- Physician Orders Details Patient Name: VENEZIA, SARGEANT. Date of Service: 04/04/2020 8:00 AM Medical Record [id number] Patient Account Number: [id number] Date of Birth/Sex: Treating  RN: [birth year] (49 y.o. Clearnce Sorrel Primary Care Provider: Inda Coke Other Clinician: Referring Provider: Treating Provider/Extender:Tavian Callander, Annice Needy, Calton Golds in Treatment: 6 Verbal / Phone Orders: No Diagnosis Coding ICD-10 Coding Code Description E11.621 Type 2 diabetes mellitus with foot ulcer L97.522 Non-pressure chronic ulcer of other part of left foot with fat layer exposed E11.42 Type 2 diabetes mellitus with diabetic polyneuropathy Discharge From [facility] Discharge from [place] - call if wound re-opens Notes cushion/pad areas to both lateral areas on feet Electronic Signature(s) Signed: 04/04/2020 6:04:52 PM By: Kela Millin Signed: 04/07/2020 9:07:11 AM By: Linton Ham MD Entered By: Kela Millin on 04/04/2020 08:35:02 -------------------------------------------------------------------------------- Problem List Details Patient Name: Hermelinda Medicus K. Date of Service: 04/04/2020 8:00 AM Medical Record [id number] Patient Account Number: [id number] Date of Birth/Sex: Treating RN: 1971-08-16 (49 y.o. Clearnce Sorrel Primary Care Provider: Inda Coke Other Clinician: Referring Provider: Treating Provider/Extender:Bentli Llorente, Annice Needy, Calton Golds in Treatment: 6 Active Problems ICD-10 Evaluated Encounter Code Description Active Date Today Diagnosis E11.621 Type 2 diabetes mellitus with foot ulcer 02/18/2020 No Yes L97.522 Non-pressure chronic ulcer of other part of left foot 02/18/2020 No Yes with fat layer exposed E11.42 Type 2 diabetes mellitus with diabetic polyneuropathy 02/18/2020 No Yes Inactive Problems Resolved Problems Electronic Signature(s) Signed: 04/07/2020 9:07:11 AM By: Linton Ham MD Entered By: Linton Ham on 04/04/2020 08:43:53 -------------------------------------------------------------------------------- Progress Note Details Patient Name: [name]. Date of Service: 04/04/2020 8:00 AM Medical Record [id number] Patient Account Number: [id number] Date of Birth/Sex: Treating RN: 1971-07-05 (49 y.o. Clearnce Sorrel Primary Care Provider: Inda Coke Other Clinician: Referring Provider: Treating Provider/Extender:Havah Ammon, Annice Needy, Calton Golds in Treatment: 6 Subjective History of Present Illness (HPI) 12/10/16; Mrs. Heather [name] is a 49 year old type II diabetic with polyneuropathy and stage IV chronic renal failure. 2 weeks ago she noted that she had a burn on the right foot. She suffered this having her foot to close to a heater at work although she did not realize this until she got home secondary to neuropathy. She has not been putting anything specific on the wound. We apparently cared for her in 2015 with a wound on her left plantar foot. This required a total contact cast. She had not had any subsequent wound issues. He does not have diabetic foot wear. ABI in this clinic was 1.01 on the right 12/16/16; wound is smaller although the patient still describes some drainage using silver alginate changed to Prisma today. 12/31/16 wound is smaller although still a difficult debrided here in surface. Switched from [place] to [place] 01/10/17; patient has a new been area on the right lateral malleolus. This was previously darker skin I wonder if she also burned this at some point and we just didn't recognize it. Using Iodoflex to the lateral foot 01/17/17; patient was in the ER on Saturday with increased right foot pain. They did an x-ray of this that showed no fracture or subluxation. She had soft tissue swelling on the lateral foot as a result of the wound. There was no evidence of bony erosion or destruction to suggest osteomyelitis. They put her on doxycycline 100 twice a day for 7 days. She arrives today with the wound still not in very good condition. Necrotic tissue debrided with a #3 curet. The area on the lateral  ankle looks stable 01/27/17  improved today with Santyl. Lateral foot wound is just about closed her original burn injury much better after debridement healthy-looking granulation 02/03/17; lateral foot wound still is open with a nonviable surface. The area over the lateral malleolus also was not closed. There is no evidence of surrounding infection. No evidence of ongoing ischemia 02/10/2017 - patient is here today for evaluation of her right lateral foot and right lateral malleolus ulcerations. She states she's had no issues or concerns over the past week. She states that her blood sugars have consistently been greater than 200, closer to the 250 range. She states that she saw her PCP last week who manages her diabetes, with no change in her current treatment plan. She has been intimately compliant with wearing a surgical shoe, although the surgical shoe may be causing undue friction and/or pressure to her lateral malleolus. 02/17/17; right lateral foot and right lateral malleolus ulcerations. These were initially burn issues. We have been using Hydrofera Blue 2/29/18; the right lateral malleolus remains closed. Right lateral foot wound is slightly smaller with some depth. Using [facility] 03/03/17 the right lateral malleolus wound remains closed right lateral leg had some necrotic surface material. Using Hydrofera Blue. Change to Silver Collagen 03/10/17; arrives today with complaints of pain on the right lateral foot. Some purulent material noted by her intake nurse. We'll use silver collagen last week 03/17/17; right lateral foot wound has closed. Culture last week staph and strep she is completed doxycycline. The area looks considerably better. READMISSION 02/18/2020 Mrs. Heather [name] is now a 49 year old woman who we had in the clinic last in 2018 with a burn on her right lateral foot and her right lateral malleolus we eventually close this out. [name] also previously been here in 2015 and 2017. She  has type 2 diabetes with peripheral neuropathy. She also has now chronic renal failure on dialysis Tuesday Thursday Saturday. [name] referred here brought by her primary provider for wounds on the mirror image side of her lateral foot left greater than right. She has had a reasonably nice work-up at primary care which is included an ABI done on 02/01/2020 [see below] also an x-ray of the left foot that was normal. She also has an MRI but all she does not currently have an appointment. She tells [name] that she has had the wound on her bilateral feet for 2 months. Noticed by her husband. She has had a round of Augmentin and is currently on doxycycline she has been using bacitracin. Past medical history; type 2 diabetes with peripheral neuropathy and PAD, chronic renal failure on dialysis. She had a CVA fibromyalgia, was treated aggressively for aortitis and is currently following this with vascular in [facility]. 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 ABIs on 02/01/2020 showed an ABI of 0.73 on the right and 0.94 on the left. TBI's were normal at 0.86 and 0.73. Waveforms on the right were biphasic and on the left triphasic. 3/1; the area on the right lateral foot seems to have closed over. Left foot smaller by about 2 mm however nonviable tissue around the wound. She says she has been using her surgical shoes although she came in here with slipper- like booties 3/8; right lateral foot still is closed although there is some callus. Left foot is smaller callus around the edges some debris on the surface wound 4/9; right lateral foot is closed left foot copious amounts of callus I removed some of this with  a #10 scalpel there is no open wound here. She obviously Eberts at the ankle when she walks. It came out today that she actually has custom shoes with inserts apparently made it bi[employer]. I did not know anything about this. She comes in every  time in slippers. Patient History Information obtained from Patient. Family History Diabetes - Mother,Siblings, Heart Disease - Father,Child, Hypertension - Father, Kidney Disease - Siblings, Stroke - Paternal Grandparents, No family history of Lung Disease, Seizures, Thyroid Problems, Tuberculosis. Social History Never smoker, Marital Status - Married, Alcohol Use - Never, Drug Use - No History, Caffeine Use - Moderate. Medical History Eyes Denies history of Cataracts, Glaucoma, Optic Neuritis Ear/Nose/Mouth/Throat Denies history of Chronic sinus problems/congestion, Middle ear problems Hematologic/Lymphatic Patient has history of Anemia Denies history of Hemophilia, Human Immunodeficiency Virus, Lymphedema, Sickle Cell Disease Respiratory Denies history of Aspiration, Asthma, Chronic Obstructive Pulmonary Disease (COPD), Pneumothorax, Sleep Apnea, Tuberculosis Cardiovascular Patient has history of Hypertension Denies history of Angina, Arrhythmia, Congestive Heart Failure, Coronary Artery Disease, Deep Vein Thrombosis, Hypotension, Myocardial Infarction, Peripheral Arterial Disease, Peripheral Venous Disease, Phlebitis, Vasculitis Gastrointestinal Denies history of Cirrhosis , Colitis, Crohnoos, Hepatitis A, Hepatitis B, Hepatitis C Endocrine Patient has history of Type II Diabetes Genitourinary Denies history of End Stage Renal Disease Immunological Denies history of Lupus Erythematosus, Raynaudoos, Scleroderma Integumentary (Skin) Denies history of History of Burn Musculoskeletal Denies history of Gout, Rheumatoid Arthritis, Osteoarthritis, Osteomyelitis Neurologic Patient has history of Neuropathy Denies history of Dementia, Quadriplegia, Paraplegia, Seizure Disorder Oncologic Denies history of Received Chemotherapy, Received Radiation Psychiatric Denies history of Anorexia/bulimia, Confinement Anxiety Hospitalization/Surgery History - Bascilic Vein Transposition. -  Eye Surgery. - Fibroid Tumor Removal. - Foot Surgery. - graft for dialysis right arm-failed x2 days 3/31-4/2/21inpt. Medical And Surgical History Notes Constitutional Symptoms (General Health) Obesity Eyes Diabetic Retinopathy Hematologic/Lymphatic B12 Deficiency Respiratory Community acquired Pneumonia Upper Airway Cough Syndrome Cardiovascular Murmur Gastrointestinal Epigastric Pain, IBS Endocrine Hyperthyroidism Genitourinary Leiomyoma of Uterus Chronic Kidney Disease Stage IV Musculoskeletal Bilat pronation deformity of feet Right Sided Lower Back Pain Objective Constitutional Patient is hypotensive.. Pulse regular and within target range for patient.[name] Respirations regular, non-labored and within target range.. Temperature is normal and within the target range for the patient.[name] Appears in no distress. Vitals Time Taken: 8:01 AM, Height: 69 in, Weight: 225 lbs, BMI: 33.2, Temperature: 97.9 F, Pulse: 89 bpm, Respiratory Rate: 18 breaths/min, Blood Pressure: 136/59 mmHg, Capillary Blood Glucose: 188 mg/dl. Respiratory work of breathing is normal. Cardiovascular Pedal pulses are palpable. Psychiatric appears at normal baseline. General Notes: Wound exam; the patient came into clinic with an open wound on the left lateral foot also the right lateral foot mirror-image wounds at the base of the fifth metatarsals. Both of these are closed. She still has callus in both wound areas left greater than right. I removed some of the callus on the left side I could not identify any open wound. Integumentary (Hair, Skin) No erythema around any wound site. Wound #4 status is Healed - Epithelialized. Original cause of wound was Gradually Appeared. The wound is located on the Left,Lateral Foot. The wound measures 0cm length x 0cm width x 0cm depth; 0cm^2 area and 0cm^3 volume. There is Fat Layer (Subcutaneous Tissue) Exposed exposed. There is no tunneling or undermining noted. There is a  none present amount of drainage noted. The wound margin is flat and intact. There is no granulation within the wound bed. There is no necrotic tissue within the wound bed. General Notes: callus noted  to wound and periwound. Assessment Active Problems ICD-10 Type 2 diabetes mellitus with foot ulcer Non-pressure chronic ulcer of other part of left foot with fat layer exposed Type 2 diabetes mellitus with diabetic polyneuropathy Plan Discharge From [facility]: Discharge from [place] - call if wound re-opens General Notes: cushion/pad areas to both lateral areas on feet 1. The patient can be discharged from the wound care center 2. She apparently has custom shoes with inserts made a biotech I was not involved with this she simply notes this today 3. I have advised her to keep this area cushioned or padded with callus pads even in her custom-made shoes. She obviously everts when she walks Electronic Signature(s) Signed: 04/07/2020 9:07:11 AM By: Linton Ham MD Entered By: Linton Ham on 04/04/2020 08:48:02 -------------------------------------------------------------------------------- HxROS Details Patient Name: Date of Service: [name]. 04/04/2020 8:00 AM Medical Record [id number] Patient Account Number: [id number] Date of Birth/Sex: Treating RN: 26-Oct-1971 (49 y.o. [name] Primary Care Provider: Inda Coke Other Clinician: Referring Provider: Treating Provider/Extender:Kalee Broxton, Annice Needy, Calton Golds in Treatment: 6 Information Obtained From Patient Constitutional Symptoms (General Health) Medical History: Past Medical History Notes: Obesity Eyes Medical History: Negative for: Cataracts; Glaucoma; Optic Neuritis Past Medical History Notes: Diabetic Retinopathy Ear/Nose/Mouth/Throat Medical History: Negative for: Chronic sinus problems/congestion; Middle ear problems Hematologic/Lymphatic Medical  History: Positive for: Anemia Negative for: Hemophilia; Human Immunodeficiency Virus; Lymphedema; Sickle Cell Disease Past Medical History Notes: B12 Deficiency Respiratory Medical History: Negative for: Aspiration; Asthma; Chronic Obstructive Pulmonary Disease (COPD); Pneumothorax; Sleep Apnea; Tuberculosis Past Medical History Notes: Community acquired Pneumonia Upper Airway Cough Syndrome Cardiovascular Medical History: Positive for: Hypertension Negative for: Angina; Arrhythmia; Congestive Heart Failure; Coronary Artery Disease; Deep Vein Thrombosis; Hypotension; Myocardial Infarction; Peripheral Arterial Disease; Peripheral Venous Disease; Phlebitis; Vasculitis Past Medical History Notes: Murmur Gastrointestinal Medical History: Negative for: Cirrhosis ; Colitis; Crohns; Hepatitis A; Hepatitis B; Hepatitis C Past Medical History Notes: Epigastric Pain, IBS Endocrine Medical History: Positive for: Type II Diabetes Past Medical History Notes: Hyperthyroidism Time with diabetes: since age 25 Treated with: Insulin Blood sugar tested every day: Yes Tested : Genitourinary Medical History: Negative for: End Stage Renal Disease Past Medical History Notes: Leiomyoma of Uterus Chronic Kidney Disease Stage IV Immunological Medical History: Negative for: Lupus Erythematosus; Raynauds; Scleroderma Integumentary (Skin) Medical History: Negative for: History of Burn Musculoskeletal Medical History: Negative for: Gout; Rheumatoid Arthritis; Osteoarthritis; Osteomyelitis Past Medical 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 Lower Back Pain Neurologic Medical History: Positive for: Neuropathy Negative for: Dementia; Quadriplegia; Paraplegia; Seizure Disorder Oncologic Medical History: Negative for: Received Chemotherapy; Received Radiation Psychiatric Medical History: Negative for: Anorexia/bulimia; Confinement Anxiety Immunizations Pneumococcal  Vaccine: Received Pneumococcal Vaccination: No Implantable Devices No devices added Hospitalization / Surgery History Type of Hospitalization/Surgery Bascilic Vein Transposition Eye Surgery Fibroid Tumor Removal Foot Surgery graft for dialysis right arm-failed x2 days 3/31-4/2/21inpt Family and Social History Diabetes: Yes - Mother,Siblings; Heart Disease: Yes - Father,Child; Hypertension: Yes - Father; Kidney Disease: Yes - Siblings; Lung Disease: No; Seizures: No; Stroke: Yes - Paternal Grandparents; Thyroid Problems: No; Tuberculosis: No; Never smoker; Marital Status - Married; Alcohol Use: Never; Drug Use: No History; Caffeine Use: Moderate; Financial Concerns: No; Food, Clothing or Shelter Needs: No; Transportation Concerns: No Electronic Signature(s) Signed: 04/04/2020 5:55:58 PM By: Deon Pilling Signed: 04/07/2020 9:07:11 AM By: Linton Ham MD Entered By: Deon Pilling on 04/04/2020 08:07:22 -------------------------------------------------------------------------------- SuperBill Details Patient Name: [name]. Date of Service: 04/04/2020 Medical Record [id number] Patient  Account Number: [id number] Date of Birth/Sex: Treating RN: Jan 01, 1971 (49 y.o. 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

## 2020-04-09 NOTE — Progress Notes (Signed)
MAECYN, PANNING ([id number]) Visit Report for 03/03/2020 Arrival Information Details Patient Name: Date of Service: Heather [name], Heather [name] 03/03/2020 12:30 PM Medical Record [id number] Patient Account Number: [id number] Date of Birth/Sex: Treating RN: Heather [name] (49 y.o. Heather [name] Primary Care Heather [name]: Heather [name] Other Clinician: Referring Heather [name]: Treating Heather [name]/Extender:Heather [name] in Treatment: 2 Visit Information History Since Last Visit All ordered tests and consults were completed: No Patient Arrived: Heather [name] Added or deleted any medications: No Arrival Time: 12:57 Any new allergies or adverse reactions: No Accompanied By: self Had a fall or experienced change in No Transfer Assistance: None activities of daily living that Heather affect Patient Identification Verified: Yes risk of falls: Secondary Verification Process Yes Signs or symptoms of abuse/neglect since last visito No Completed: Hospitalized since last visit: No Patient Has Alerts: Yes Implantable device outside of the clinic excluding No Patient Alerts: Patient on Blood cellular tissue based products placed in the center Thinner since last visit: ABI left .94 Has Dressing in Place as Prescribed: No ABI right .73 Pain Present Now: No Electronic Signature(s) Signed: 03/05/2020 7:20:02 AM By: Heather Coria RN Entered By: Heather [name] on 03/03/2020 12:59:26 -------------------------------------------------------------------------------- Encounter Discharge Information Details Patient Name: Date of Service: Heather [name]. 03/03/2020 12:30 PM Medical Record [id number] Patient Account Number: [id number] Date of Birth/Sex: Treating RN: Heather [name] (49 y.o. Heather [name] Primary Care Jaykob Minichiello: Heather [name] Other Clinician: Referring Dub Maclellan: Treating Daisia Slomski/Extender:Heather [name] in Treatment: 2 Encounter  Discharge Information Items Post Procedure Vitals Discharge Condition: Stable Temperature (F): 98 Ambulatory Status: Walker Pulse (bpm): 92 Discharge Destination: Home Respiratory Rate (breaths/min): 18 Transportation: Private Auto Blood Pressure (mmHg): 169/89 Accompanied By: self Schedule Follow-up Appointment: Yes Clinical Summary of Care: Electronic Signature(s) Signed: 03/03/2020 5:42:31 PM By: Heather [name] Entered By: Heather [name] on 03/03/2020 13:29:32 -------------------------------------------------------------------------------- Lower Extremity Assessment Details Patient Name: Date of Service: Heather [name], Heather [name] 03/03/2020 12:30 PM Medical Record [id number] Patient Account Number: [id number] Date of Birth/Sex: Treating RN: [birth year] (48 y.o. Heather [name] Primary Care Loraina Stauffer: Heather [name] Other Clinician: Referring Darrious Youman: Treating Alysia Scism/Extender:Heather [name] in Treatment: 2 Edema Assessment Assessed: [Left: No] [Right: No] Edema: [Left: Ye] [Right: s] Calf Left: Right: Point of Measurement: 40 cm From Medial Instep 37 cm cm Ankle Left: Right: Point of Measurement: 10 cm From Medial Instep 21 cm cm Electronic Signature(s) Signed: 03/05/2020 7:20:02 AM By: Heather Coria RN Entered By: Heather [name] on 03/03/2020 13:06:47 -------------------------------------------------------------------------------- Multi Wound Chart Details Patient Name: Date of Service: Heather [name]. 03/03/2020 12:30 PM Medical Record [id number] Patient Account Number: [id number] Date of Birth/Sex: Treating RN: Heather [name] (49 y.o. Heather [name] Primary Care Evangelynn Lochridge: Heather [name] Other Clinician: Referring Jalisia Puchalski: Treating Aviv Lengacher/Extender:Heather [name] in Treatment: 2 Vital Signs Height(in): 69 Pulse(bpm): 92 Weight(lbs): 225 Blood Pressure(mmHg): 169/89 Body Mass Index(BMI):  33 Temperature(F): 98 Respiratory 18 Rate(breaths/min): Photos: [4:No Photos] [N/A:N/A] Wound Location: [4:Left Foot - Lateral] [N/A:N/A] Wounding Event: [4:Gradually Appeared] [N/A:N/A] Primary Etiology: [4:Diabetic Wound/Ulcer of the N/A Lower Extremity] Comorbid History: [4:Anemia, Hypertension, TypeN/A II Diabetes, Neuropathy] Date Acquired: [4:12/28/2019] [N/A:N/A] Weeks of Treatment: [4:2] [N/A:N/A] Wound Status: [4:Open] [N/A:N/A] Measurements L x W x D 0.7x0.6x0.3 [N/A:N/A] (cm) Area (cm) : [4:0.33] [N/A:N/A] Volume (cm) : [4:0.099] [N/A:N/A] % Reduction in Area: [4:61.80%] [N/A:N/A] % Reduction in Volume: -15.10% [N/A:N/A] Classification: [4:Grade 2] [N/A:N/A] Exudate Amount: [4:Medium] [N/A:N/A] Exudate Type: [4:Serosanguineous] [N/A:N/A] Exudate Color: [4:red, brown] [N/A:N/A] Wound Margin: [4:Flat and Intact] [N/A:N/A] Granulation Amount: [4:Small (1-33%)] [  N/A:N/A] Granulation Quality: [4:Pink] [N/A:N/A] Necrotic Amount: [4:Large (67-100%)] [N/A:N/A] Exposed Structures: [4:Fat Layer (Subcutaneous N/A Tissue) Exposed: Yes Fascia: No Tendon: No Muscle: No Joint: No Bone: No] Epithelialization: [4:None] [N/A:N/A] Debridement: [4:Debridement - Excisional N/A] Pre-procedure [4:13:13] [N/A:N/A] Verification/Time Out Taken: Tissue Debrided: [4:Callus, Subcutaneous] [N/A:N/A] Level: [4:Skin/Subcutaneous Tissue N/A] Debridement Area (sq cm):0.42 [N/A:N/A] Instrument: [4:Curette] [N/A:N/A] Bleeding: [4:Minimum] [N/A:N/A] Hemostasis Achieved: [4:Pressure] [N/A:N/A] Procedural Pain: [4:0] [N/A:N/A] Post Procedural Pain: [4:0] [N/A:N/A] Debridement Treatment Procedure was tolerated [N/A:N/A] Response: [4:well] Post Debridement [4:0.7x0.6x0.3] [N/A:N/A] Measurements L x W x D (cm) Post Debridement [4:0.099] [N/A:N/A] Volume: (cm) Procedures Performed: [4:Debridement] [N/A:N/A] Treatment Notes Electronic Signature(s) Signed: 03/03/2020 5:29:15 PM By: Linton Ham  MD Signed: 03/03/2020 5:59:01 PM By: [name] RN, BSN Entered By: Linton Ham on 03/03/2020 13:18:42 -------------------------------------------------------------------------------- Multi-Disciplinary Care Plan Details Patient Name: Date of Service: Heather [name]. 03/03/2020 12:30 PM Medical Record [id number] Patient Account Number: [id number] Date of Birth/Sex: Treating RN: [birth year] [name] (49 y.o. Heather [name] Primary Care Renezmae Canlas: Heather [name] Other Clinician: Referring Keagan Brislin: Treating Lucynda Rosano/Extender:Heather [name] in Treatment: 2 Active Inactive Nutrition Nursing Diagnoses: Impaired glucose control: actual or potential Potential for alteratiion in Nutrition/Potential for imbalanced nutrition Goals: Patient/caregiver agrees to and verbalizes understanding of need to use nutritional supplements and/or vitamins as prescribed Date Initiated: 02/18/2020 Target Resolution Date: 03/21/2020 Goal Status: Active Patient/caregiver will maintain therapeutic glucose control Date Initiated: 02/18/2020 Target Resolution Date: 03/21/2020 Goal Status: Active Interventions: Assess HgA1c results as ordered upon admission and as needed Assess patient nutrition upon admission and as needed per policy Provide education on elevated blood sugars and impact on wound healing Provide education on nutrition Treatment Activities: Education provided on Nutrition : 02/18/2020 Notes: Wound/Skin Impairment Nursing Diagnoses: Impaired tissue integrity Knowledge deficit related to ulceration/compromised skin integrity Goals: Patient/caregiver will verbalize understanding of skin care regimen Date Initiated: 02/18/2020 Target Resolution Date: 03/21/2020 Goal Status: Active Ulcer/skin breakdown will have a volume reduction of 30% by week 4 Date Initiated: 02/18/2020 Target Resolution Date: 03/21/2020 Goal Status: Active Interventions: Assess  patient/caregiver ability to obtain necessary supplies Assess patient/caregiver ability to perform ulcer/skin care regimen upon admission and as needed Assess ulceration(s) every visit Provide education on ulcer and skin care Notes: Electronic Signature(s) Signed: 03/03/2020 5:59:01 PM By: [name] RN, BSN Entered By: [name] on 03/03/2020 13:14:01 -------------------------------------------------------------------------------- Pain Assessment Details Patient Name: Date of Service: Heather [name] 03/03/2020 12:30 PM Medical Record [id number] Patient Account Number: [id number] Date of Birth/Sex: Treating RN: [birth year] [name] (49 y.o. Heather [name] Primary Care Aidian Salomon: Heather [name] Other Clinician: Referring Kalie Cabral: Treating Adylin Hankey/Extender:Heather [name] in Treatment: 2 Active Problems Location of Pain Severity and Description of Pain Patient Has Paino No Site Locations Pain Management and Medication Current Pain Management: Electronic Signature(s) Signed: 03/05/2020 7:20:02 AM By: Heather Coria RN Entered By: Heather [name] on 03/03/2020 13:00:21 -------------------------------------------------------------------------------- Patient/Caregiver Education Details Heather [name], Heather [name] 3/8/2021andnbsp12:30 Patient Name: Date of Service: K. PM Medical Record Patient Account Number: [id number] Number: Treating RN: [name] Date of Birth/Gender: [birth year] [name] (49 y.o. F) Other Clinician: Primary Care Treating Alease Frame Physician: Physician/Extender: Referring Physician: Gaspar Garbe in Treatment: 2 Education Assessment Education Provided To: Patient Education Topics Provided Wound/Skin Impairment: Methods: Explain/Verbal Responses: State content correctly Electronic Signature(s) Signed: 03/03/2020 5:59:01 PM By: [name] RN, BSN Entered By: [name] on 03/03/2020  13:14:14 -------------------------------------------------------------------------------- Wound Assessment Details Patient Name: Date of Service: Heather [name]. 03/03/2020 12:30 PM Medical Record [id number] Patient Account Number:  [id number] Date of Birth/Sex: Treating RN: [name] (49 y.o. Heather [name] Primary Care Antinio Sanderfer: Heather [name] Other Clinician: Referring Marley Charlot: Treating Oreoluwa Gilmer/Extender:Heather [name] in Treatment: 2 Wound Status Wound Number: 4 Primary Diabetic Wound/Ulcer of the Lower Etiology: Extremity Wound Location: Left Foot - Lateral Wound Open Wounding Event: Gradually Appeared Status: Date Acquired: 12/28/2019 Date Acquired: 12/28/2019 Comorbid Anemia, Hypertension, Type II Diabetes, Weeks Of Treatment: 2 History: Neuropathy Clustered Wound: No Photos Wound Measurements Length: (cm) 0.7 Width: (cm) 0.6 Depth: (cm) 0.3 Area: (cm) 0.33 Volume: (cm) 0.099 Wound Description Classification: Grade 2 Wound Margin: Flat and Intact Exudate Amount: Medium Exudate Type: Serosanguineous Exudate Color: red, brown Wound Bed Granulation Amount: Small (1-33%) Granulation Quality: Pink Necrotic Amount: Large (67-100%) Necrotic Quality: Adherent Slough r After Cleansing: No ibrino Yes Exposed Structure posed: No (Subcutaneous Tissue) Exposed: Yes posed: No posed: No osed: No sed: No % Reduction in Area: 61.8% % Reduction in Volume: -15.1% Epithelialization: None Tunneling: No Undermining: No Foul Odo Slough/F Fascia Ex Fat Layer Tendon Ex Muscle Ex Joint Exp Bone Expo Electronic Signature(s) Signed: 03/04/2020 3:29:05 PM By: Mikeal Hawthorne EMT/HBOT Signed: 04/09/2020 9:04:28 AM By: [name] RN, BSN Entered By: Mikeal Hawthorne on 03/04/2020 14:41:00 -------------------------------------------------------------------------------- Vitals Details Patient Name: Date of Service: Heather [name]. 03/03/2020 12:30 PM Medical Record [id number] Patient Account Number: [id number] Date of Birth/Sex: Treating RN: [birth year] (49 y.o. Heather [name] Primary Care Hanae Waiters: Heather [name] Other Clinician: Referring Warda Mcqueary: Treating Burnham Trost/Extender:Heather [name] in Treatment: 2 Vital Signs Time Taken: 12:59 Temperature (F): 98 Height (in): 69 Pulse (bpm): 92 Weight (lbs): 225 Respiratory Rate (breaths/min): 18 Body Mass Index (BMI): 33.2 Blood Pressure (mmHg): 169/89 Reference Range: 80 - 120 mg / dl Electronic Signature(s) Signed: 03/05/2020 7:20:02 AM By: Heather Coria RN Entered By: Heather [name] on 03/03/2020 13:00:13

## 2020-04-09 NOTE — Progress Notes (Signed)
Heather [name] ([id number]) Visit Report for 02/25/2020 Arrival Information Details Patient Name: Date of Service: Heather [name], Heather [name] 02/25/2020 1:15 PM Medical Record [id number] Patient Account Number: [id number] Date of Birth/Sex: Treating RN: [birth year] (49 y.o. [name] Primary Care Latori Beggs: Inda Coke Other Clinician: Referring Brandn Mcgath: Treating Daven Pinckney/Extender:Robson, Annice Needy, Calton Golds in Treatment: 1 Visit Information History Since Last Visit Added or deleted any medications: No Patient Arrived: Gilford Rile Any new allergies or adverse reactions: No Arrival Time: 13:04 Had a fall or experienced change in No Accompanied By: self activities of daily living that may affect Transfer Assistance: None risk of falls: Patient Identification Verified: Yes Signs or symptoms of abuse/neglect since last No Secondary Verification Process Yes visito Completed: Hospitalized since last visit: No Patient Has Alerts: Yes Implantable device outside of the clinic excluding No Patient Alerts: Patient on Blood cellular tissue based products placed in the center Thinner since last visit: ABI left .94 Has Dressing in Place as Prescribed: Yes ABI right .73 Pain Present Now: No Electronic Signature(s) Signed: 04/09/2020 9:21:08 AM By: Sandre Kitty Entered By: Sandre Kitty on 02/25/2020 13:07:42 -------------------------------------------------------------------------------- Encounter Discharge Information Details Patient Name: Date of Service: Heather [name] 02/25/2020 1:15 PM Medical Record [id number] Patient Account Number: [id number] Date of Birth/Sex: Treating RN: Feb 21, 1971 (49 y.o. [name] Primary Care Drishti Pepperman: Inda Coke Other Clinician: Referring Matteus Mcnelly: Treating Harpreet Pompey/Extender:Robson, Annice Needy, Calton Golds in Treatment: 1 Encounter Discharge Information Items Post Procedure  Vitals Discharge Condition: Stable Temperature (F): 98 Ambulatory Status: Walker Pulse (bpm): 87 Discharge Destination: Home Respiratory Rate (breaths/min): 18 Transportation: Private Auto Blood Pressure (mmHg): 179/92 Accompanied By: self Schedule Follow-up Appointment: Yes Clinical Summary of Care: Electronic Signature(s) Signed: 02/25/2020 5:15:23 PM By: Deon Pilling Entered By: Deon Pilling on 02/25/2020 14:30:43 -------------------------------------------------------------------------------- Lower Extremity Assessment Details Patient Name: Date of Service: Heather [name] 02/25/2020 1:15 PM Medical Record [id number] Patient Account Number: [id number] Date of Birth/Sex: Treating RN: 1971-07-18 (49 y.o. Clearnce Sorrel Primary Care Franceska Strahm: Inda Coke Other Clinician: Referring Elivia Robotham: Treating Janiyah Beery/Extender:Robson, Annice Needy, Calton Golds in Treatment: 1 Edema Assessment Assessed: [Left: No] [Right: No] Edema: [Left: Ye] [Right: s] Calf Left: Right: Point of Measurement: 40 cm From Medial Instep 37 cm cm Ankle Left: Right: Point of Measurement: 10 cm From Medial Instep 21 cm cm Vascular Assessment Pulses: Dorsalis Pedis Palpable: [Left:Yes] Electronic Signature(s) Signed: 02/25/2020 5:21:05 PM By: Kela Millin Entered By: Kela Millin on 02/25/2020 13:17:20 -------------------------------------------------------------------------------- Multi Wound Chart Details Patient Name: Date of Service: Heather [name] 02/25/2020 1:15 PM Medical Record [id number] Patient Account Number: [id number] Date of Birth/Sex: Treating RN: August 19, 1971 (49 y.o. 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] Comorbid History: [4:Anemia, Hypertension, TypeN/A II Diabetes, Neuropathy] Date Acquired: [4:12/28/2019] [N/A:N/A] Weeks of Treatment: [4:1] [N/A:N/A] Wound Status: [4:Open] [N/A:N/A] Measurements L x W x D 0.8x0.9x0.1 [N/A:N/A] (cm) Area (cm) : [4:0.565] [N/A:N/A] Volume (cm) : [4:0.057] [N/A:N/A] % Reduction in Area: [4:34.60%] [N/A:N/A] % Reduction in Volume: 33.70% [N/A:N/A] Classification: [4:Grade 2] [N/A:N/A] Exudate Amount: [4:Medium] [N/A:N/A] Exudate Type: [4:Serosanguineous] [N/A:N/A] Exudate Color: [4:red, brown] [N/A:N/A] Wound Margin: [4:Flat and Intact] [N/A:N/A] Granulation Amount: [4:Small (1-33%)] [N/A:N/A] Granulation Quality: [  4:Pink] [N/A:N/A] Necrotic Amount: [4:Large (67-100%)] [N/A:N/A] Exposed Structures: [4:Fat Layer (Subcutaneous N/A Tissue) Exposed: Yes Fascia: No Tendon: No Muscle: No Joint: No Bone: No] Epithelialization: [4:None] [N/A:N/A] Debridement: [4:Debridement - Excisional N/A] Pre-procedure [4:14:12] [N/A:N/A] Verification/Time Out Taken: Pain Control: [4:Lidocaine 5% topical ointment] [N/A:N/A] Tissue Debrided: [4:Callus, Subcutaneous] [N/A:N/A] Level: [4:Skin/Subcutaneous Tissue N/A] Debridement Area (sq cm):0.72 [N/A:N/A] Instrument: [4:Curette] [N/A:N/A] Bleeding: [4:Minimum] [N/A:N/A] Hemostasis Achieved: [4:Pressure] [N/A:N/A] Procedural Pain: [4:0] [N/A:N/A] Post Procedural Pain: [4:0] [N/A:N/A] Debridement Treatment [4:Procedure was tolerated] [N/A:N/A] Response: [4:well] Post Debridement [4:0.8x0.9x0.1] [N/A:N/A] Measurements L x W x D (cm) Post Debridement [4:0.057] [N/A:N/A] Volume: (cm) Procedures Performed: [4:Debridement]  [N/A:N/A] Treatment Notes Wound #4 (Left, Lateral Foot) 1. Cleanse With Wound Cleanser 3. Primary Dressing Applied Calcium Alginate Ag 4. Secondary Dressing Dry Gauze Roll Gauze 5. Secured With [facility] [employer]) Signed: 02/25/2020 5:45:55 PM By: Linton Ham MD Signed: 02/25/2020 5:57:14 PM By: [name] RN, BSN Entered By: Linton Ham on 02/25/2020 14:35:38 -------------------------------------------------------------------------------- Multi-Disciplinary Care Plan Details Patient Name: Date of Service: KRYSTALL, KRUCKENBERG 02/25/2020 1:15 PM Medical Record [id number] Patient Account Number: [id number] Date of Birth/Sex: Treating RN: 05-21-1971 (49 y.o. [name] Primary Care Shameeka Silliman: Inda Coke Other Clinician: Referring Auria Mckinlay: Treating Kouper Spinella/Extender:Robson, Annice Needy, Calton Golds in Treatment: 1 Active Inactive Nutrition Nursing Diagnoses: Impaired glucose control: actual or potential Potential for alteratiion in Nutrition/Potential for imbalanced nutrition Goals: Patient/caregiver agrees to and verbalizes understanding of need to use nutritional supplements and/or vitamins as prescribed Date Initiated: 02/18/2020 Target Resolution Date: 03/21/2020 Goal Status: Active Patient/caregiver will maintain therapeutic glucose control Date Initiated: 02/18/2020 Target Resolution Date: 03/21/2020 Goal Status: Active Interventions: Assess HgA1c results as ordered upon admission and as needed Assess patient nutrition upon admission and as needed per policy Provide education on elevated blood sugars and impact on wound healing Provide education on nutrition Treatment Activities: Education provided on Nutrition : 02/18/2020 Notes: Wound/Skin Impairment Nursing Diagnoses: Impaired tissue integrity Knowledge deficit related to ulceration/compromised skin integrity Goals: Patient/caregiver will verbalize  understanding of skin care regimen Date Initiated: 02/18/2020 Target Resolution Date: 03/21/2020 Goal Status: Active Ulcer/skin breakdown will have a volume reduction of 30% by week 4 Date Initiated: 02/18/2020 Target Resolution Date: 03/21/2020 Goal Status: Active Interventions: Assess patient/caregiver ability to obtain necessary supplies Assess patient/caregiver ability to perform ulcer/skin care regimen upon admission and as needed Assess ulceration(s) every visit Provide education on ulcer and skin care Notes: Electronic Signature(s) Signed: 02/25/2020 5:57:14 PM By: [name] RN, BSN Entered By: [name] on 02/25/2020 17:50:13 -------------------------------------------------------------------------------- Pain Assessment Details Patient Name: Date of Service: Heather [name] 02/25/2020 1:15 PM Medical Record [id number] Patient Account Number: [id number] Date of Birth/Sex: Treating RN: 1971/03/23 (49 y.o. [name] Primary Care Yaret Hush: Inda Coke Other Clinician: Referring Kamare Caspers: Treating Rayaan Garguilo/Extender:Robson, Annice Needy, Calton Golds in Treatment: 1 Active Problems Location of Pain Severity and Description of Pain Patient Has Paino No Site Locations Pain Management and Medication Current Pain Management: Electronic Signature(s) Signed: 02/25/2020 5:57:14 PM By: [name] RN, BSN Signed: 04/09/2020 9:21:08 AM By: Sandre Kitty Entered By: Sandre Kitty on 02/25/2020 13:11:34 -------------------------------------------------------------------------------- Patient/Caregiver Education Details SCOTT-Gavigan, [place] 3/1/2021andnbsp1:15 Patient Name: Date of Service: K. PM Medical Record Patient Account Number: [id number] Number: Treating RN: [name] Date of Birth/Gender: [birth year] (49 y.o. F) Other Clinician: Primary Care Physician: Inda Coke Treating Linton Ham Referring  Physician: Physician/Extender: Gaspar Garbe in Treatment: 1 Education Assessment Education Provided To: Patient Education Topics Provided Wound/Skin Impairment: Methods: Explain/Verbal Responses: State  content correctly Electronic Signature(s) Signed: 02/25/2020 5:57:14 PM By: [name] RN, BSN Entered By: [name] on 02/25/2020 17:50:25 -------------------------------------------------------------------------------- Wound Assessment Details Patient Name: Date of Service: TENITA, CUE 02/25/2020 1:15 PM Medical Record [id number] Patient Account Number: [id number] Date of Birth/Sex: Treating RN: 1971-11-20 (49 y.o. [name] Primary Care Mia Winthrop: Inda Coke Other Clinician: Referring Darcus Edds: Treating Yitty Roads/Extender:Robson, Annice Needy, Calton Golds in Treatment: 1 Wound Status Wound Number: 4 Primary Diabetic Wound/Ulcer of the Lower Etiology: Extremity Wound Location: Left Foot - Lateral Wound Open Wounding Event: Gradually Appeared Status: Date Acquired: 12/28/2019 Comorbid Anemia, Hypertension, Type II Diabetes, Weeks Of Treatment: 1 History: Neuropathy Clustered Wound: No Photos Wound Measurements Length: (cm) 0.8 % Reduction in Ar Width: (cm) 0.9 % Reduction in Vo Depth: (cm) 0.1 Epithelialization Area: (cm) 0.565 Tunneling: Volume: (cm) 0.057 Undermining: Wound Description Classification: Grade 2 Foul Odor After C Wound Margin: Flat and Intact Slough/Fibrino Exudate Amount: Medium Exudate Type: Serosanguineous Exudate Color: red, brown Wound Bed Granulation Amount: Small (1-33%) Granulation Quality: Pink Fascia Exposed: Necrotic Amount: Large (67-100%) Fat Layer (Subcut Necrotic Quality: Adherent Slough Tendon Exposed: Muscle Exposed: Joint Exposed: Bone Exposed: leansing: No Yes Exposed Structure No aneous Tissue) Exposed: Yes No No No No ea: 34.6% lume: 33.7% :  None No No Electronic Signature(s) Signed: 03/06/2020 4:27:59 PM By: Mikeal Hawthorne EMT/HBOT Signed: 04/09/2020 9:05:01 AM By: [name] RN, BSN Previous Signature: 02/25/2020 5:21:05 PM Version By: Kela Millin Previous Signature: 02/25/2020 5:57:14 PM Version By: [name] RN, BSN Entered By: Mikeal Hawthorne on 03/06/2020 13:44:31 -------------------------------------------------------------------------------- Vitals Details Patient Name: Date of Service: Heather [name]. 02/25/2020 1:15 PM Medical Record [id number] Patient Account Number: [id number] Date of Birth/Sex: Treating RN: Feb 04, 1971 (49 y.o. [name] Primary Care Daysi Boggan: Inda Coke Other Clinician: Referring Rayhan Groleau: Treating Mikeisha Lemonds/Extender:Robson, Annice Needy, Calton Golds in Treatment: 1 Vital Signs Time Taken: 13:09 Temperature (F): 98.0 Height (in): 69 Pulse (bpm): 87 Weight (lbs): 225 Respiratory Rate (breaths/min): 18 Body Mass Index (BMI): 33.2 Blood Pressure (mmHg): 179/92 Reference Range: 80 - 120 mg / dl Electronic Signature(s) Signed: 04/09/2020 9:21:08 AM By: Sandre Kitty Entered By: Sandre Kitty on 02/25/2020 13:11:26

## 2020-04-11 ENCOUNTER — Ambulatory Visit: Payer: Managed Care, Other (non HMO) | Admitting: Rheumatology

## 2020-04-14 ENCOUNTER — Other Ambulatory Visit: Payer: Self-pay

## 2020-04-14 ENCOUNTER — Ambulatory Visit (INDEPENDENT_AMBULATORY_CARE_PROVIDER_SITE_OTHER): Payer: Managed Care, Other (non HMO) | Admitting: Rheumatology

## 2020-04-14 ENCOUNTER — Encounter: Payer: Self-pay | Admitting: Rheumatology

## 2020-04-14 VITALS — BP 121/69 | HR 94 | Resp 16 | Ht 68.0 in | Wt 223.0 lb

## 2020-04-14 DIAGNOSIS — Z8673 Personal history of transient ischemic attack (TIA), and cerebral infarction without residual deficits: Secondary | ICD-10-CM

## 2020-04-14 DIAGNOSIS — I69354 Hemiplegia and hemiparesis following cerebral infarction affecting left non-dominant side: Secondary | ICD-10-CM

## 2020-04-14 DIAGNOSIS — N185 Chronic kidney disease, stage 5: Secondary | ICD-10-CM

## 2020-04-14 DIAGNOSIS — E113513 Type 2 diabetes mellitus with proliferative diabetic retinopathy with macular edema, bilateral: Secondary | ICD-10-CM | POA: Diagnosis not present

## 2020-04-14 DIAGNOSIS — IMO0002 Reserved for concepts with insufficient information to code with codable children: Secondary | ICD-10-CM

## 2020-04-14 DIAGNOSIS — R7689 Other specified abnormal immunological findings in serum: Secondary | ICD-10-CM

## 2020-04-14 DIAGNOSIS — E1159 Type 2 diabetes mellitus with other circulatory complications: Secondary | ICD-10-CM

## 2020-04-14 DIAGNOSIS — Z794 Long term (current) use of insulin: Secondary | ICD-10-CM

## 2020-04-14 DIAGNOSIS — I150 Renovascular hypertension: Secondary | ICD-10-CM

## 2020-04-14 DIAGNOSIS — I10 Essential (primary) hypertension: Secondary | ICD-10-CM

## 2020-04-14 DIAGNOSIS — Z8639 Personal history of other endocrine, nutritional and metabolic disease: Secondary | ICD-10-CM

## 2020-04-14 DIAGNOSIS — I3139 Other pericardial effusion (noninflammatory): Secondary | ICD-10-CM

## 2020-04-14 DIAGNOSIS — G8929 Other chronic pain: Secondary | ICD-10-CM

## 2020-04-14 DIAGNOSIS — E559 Vitamin D deficiency, unspecified: Secondary | ICD-10-CM

## 2020-04-14 DIAGNOSIS — Z992 Dependence on renal dialysis: Secondary | ICD-10-CM

## 2020-04-14 DIAGNOSIS — M545 Low back pain: Secondary | ICD-10-CM

## 2020-04-14 DIAGNOSIS — N186 End stage renal disease: Secondary | ICD-10-CM

## 2020-04-14 DIAGNOSIS — M79671 Pain in right foot: Secondary | ICD-10-CM

## 2020-04-14 DIAGNOSIS — M79672 Pain in left foot: Secondary | ICD-10-CM

## 2020-04-14 DIAGNOSIS — R768 Other specified abnormal immunological findings in serum: Secondary | ICD-10-CM

## 2020-04-14 DIAGNOSIS — M25561 Pain in right knee: Secondary | ICD-10-CM

## 2020-04-14 DIAGNOSIS — I770 Arteriovenous fistula, acquired: Secondary | ICD-10-CM

## 2020-04-14 DIAGNOSIS — Z8659 Personal history of other mental and behavioral disorders: Secondary | ICD-10-CM

## 2020-04-14 DIAGNOSIS — Z8269 Family history of other diseases of the musculoskeletal system and connective tissue: Secondary | ICD-10-CM

## 2020-04-14 DIAGNOSIS — Z8719 Personal history of other diseases of the digestive system: Secondary | ICD-10-CM

## 2020-04-14 DIAGNOSIS — Z86711 Personal history of pulmonary embolism: Secondary | ICD-10-CM | POA: Diagnosis not present

## 2020-04-14 DIAGNOSIS — I152 Hypertension secondary to endocrine disorders: Secondary | ICD-10-CM

## 2020-04-14 DIAGNOSIS — E785 Hyperlipidemia, unspecified: Secondary | ICD-10-CM

## 2020-04-14 DIAGNOSIS — M25562 Pain in left knee: Secondary | ICD-10-CM

## 2020-04-14 DIAGNOSIS — E1165 Type 2 diabetes mellitus with hyperglycemia: Secondary | ICD-10-CM

## 2020-04-14 DIAGNOSIS — I313 Pericardial effusion (noninflammatory): Secondary | ICD-10-CM

## 2020-04-16 ENCOUNTER — Other Ambulatory Visit: Payer: Self-pay

## 2020-04-16 ENCOUNTER — Encounter: Payer: Self-pay | Admitting: Family Medicine

## 2020-04-16 ENCOUNTER — Ambulatory Visit (INDEPENDENT_AMBULATORY_CARE_PROVIDER_SITE_OTHER): Payer: Managed Care, Other (non HMO) | Admitting: Family Medicine

## 2020-04-16 VITALS — BP 128/78 | HR 88 | Temp 98.2°F | Ht 68.0 in | Wt 220.0 lb

## 2020-04-16 DIAGNOSIS — E1122 Type 2 diabetes mellitus with diabetic chronic kidney disease: Secondary | ICD-10-CM | POA: Diagnosis not present

## 2020-04-16 DIAGNOSIS — E119 Type 2 diabetes mellitus without complications: Secondary | ICD-10-CM | POA: Diagnosis not present

## 2020-04-16 DIAGNOSIS — E1143 Type 2 diabetes mellitus with diabetic autonomic (poly)neuropathy: Secondary | ICD-10-CM

## 2020-04-16 DIAGNOSIS — Z992 Dependence on renal dialysis: Secondary | ICD-10-CM

## 2020-04-16 DIAGNOSIS — N186 End stage renal disease: Secondary | ICD-10-CM

## 2020-04-16 DIAGNOSIS — Z794 Long term (current) use of insulin: Secondary | ICD-10-CM

## 2020-04-16 DIAGNOSIS — E1140 Type 2 diabetes mellitus with diabetic neuropathy, unspecified: Secondary | ICD-10-CM | POA: Diagnosis not present

## 2020-04-16 DIAGNOSIS — I152 Hypertension secondary to endocrine disorders: Secondary | ICD-10-CM

## 2020-04-16 DIAGNOSIS — K3184 Gastroparesis: Secondary | ICD-10-CM

## 2020-04-16 DIAGNOSIS — R768 Other specified abnormal immunological findings in serum: Secondary | ICD-10-CM

## 2020-04-16 DIAGNOSIS — I10 Essential (primary) hypertension: Secondary | ICD-10-CM

## 2020-04-16 DIAGNOSIS — I69354 Hemiplegia and hemiparesis following cerebral infarction affecting left non-dominant side: Secondary | ICD-10-CM

## 2020-04-16 DIAGNOSIS — K589 Irritable bowel syndrome without diarrhea: Secondary | ICD-10-CM

## 2020-04-16 DIAGNOSIS — E1159 Type 2 diabetes mellitus with other circulatory complications: Secondary | ICD-10-CM

## 2020-04-16 LAB — POCT GLYCOSYLATED HEMOGLOBIN (HGB A1C): Hemoglobin A1C: 7.8 % — AB (ref 4.0–5.6)

## 2020-04-16 MED ORDER — MONTELUKAST SODIUM 10 MG PO TABS: 10.0000 mg | ORAL_TABLET | Freq: Every day | ORAL | 3 refills | Status: DC

## 2020-04-16 MED ORDER — PROMETHAZINE HCL 12.5 MG PO TABS: 12.5000 mg | ORAL_TABLET | Freq: Three times a day (TID) | ORAL | 3 refills | Status: DC | PRN

## 2020-04-16 MED ORDER — GABAPENTIN 300 MG PO CAPS: 300.0000 mg | ORAL_CAPSULE | Freq: Every day | ORAL | 5 refills | Status: DC | PRN

## 2020-04-16 NOTE — Assessment & Plan Note (Signed)
Stable.  Will refill Phenergan today.

## 2020-04-16 NOTE — Assessment & Plan Note (Signed)
Continue management per rheumatology.  Will be going for further testing soon.

## 2020-04-16 NOTE — Assessment & Plan Note (Signed)
Stable

## 2020-04-16 NOTE — Progress Notes (Signed)
Heather [name] is a 49 y.o. female who presents today for an office visit.  [name] transferring care.   Assessment/Plan:  Chronic Problems Addressed Today: Diabetic neuropathy, painful ([place]), on low dose Gabapentin Will refill gabapentin.  Instructed patient to only take 3 times weekly after dialysis.  Positive ANA (antinuclear antibody) Continue management per rheumatology.  Will be going for further testing soon.  IBS (irritable bowel syndrome) Continue Amitiza as needed.  ESRD on dialysis [facility]) Continue management per nephrology.  Hemiplegia and hemiparesis following cerebral infarction affecting left non-dominant side (HCC) Stable.  Gastroparesis due to DM (HCC) Stable.  Will refill Phenergan today.  Hypertension associated with diabetes ([place]) Controlled without blood pressure meds.  Will take lisinopril, Coreg, and amlodipine off of her medication list.  Diabetes mellitus, type II, insulin dependent ([place]), on CGM, with end organ damage: renal, neuropathy, gastroparesis, retinopathy A1c 7.8.  Fasting sugars in upper 100s.  Will increase Tresiba to 30 units daily.  Continue NovoLog 6 units 3 times daily with meals.  Continue working on diet and exercise.  She will check with her insurance company to see which insulins are covered.  Follow-up in 3 months and recheck A1c at that time.     Subjective:  HPI:  #Type 2 diabetes -On Tresiba 28 units daily and NovoLog 6 units 3 times daily with meals -Working on [employer].  # Essential Hypertension -Controlled via HD  # Diabetic Neuropathy - On gabapentin 300mg  daily as needed  #  Positive ANA - Following with Rheumatology, may have diagnosis of lupus though this not confirmed.  # History of PE 2020 - Anticoagulated on eliquis 5mg  twice daily  # IBS -On Amitiza 24 mcg twice daily  # ESRD On HD, TThS - Follows with nephrology - On midodrine 5mg  twice daily  PMH:  The following were reviewed and  entered/updated in epic: Past Medical History:  Diagnosis Date  . Antiplatelet or antithrombotic long-term use: Plavix 06/30/2017  . B12 deficiency 05/10/2014  . Blood transfusion without reported diagnosis   . Chronic constipation   . CVA (cerebral vascular accident) ([place]), nonhemorrhagic, inferior right cerebellum 12/03/2017   left side weakness in leg  . Cyst of left ovary 01/12/2018  . Diabetic neuropathy, painful ([place]), on low dose Gabapentin 07/13/2013  . DM (diabetes mellitus), type 2, uncontrolled, with renal complications ([place]) 09/73/5329  . DM2 (diabetes mellitus, type 2) ([place])   . Dysfunction of left eustachian tube, with pusatile tinnitus 11/24/2017  . Dyslipidemia associated with type 2 diabetes mellitus ([place]) 06/25/2016  . ESRD (end stage renal disease) on dialysis [facility])    On hemodialysis in July 2019 via [facility] then switched to [facility] in Nov 2019.   [name] ESRD with anemia ([place])   . Fibromyalgia   . Gastroparesis due to DM   . GERD (gastroesophageal reflux disease)   . Headache   . Hypertension associated with diabetes ([place]) 02/19/2011  . IBS (irritable bowel syndrome)   . Left-sided weakness 12/10/2017   .  [name] Leiomyoma of uterus   . Pancreatitis   . PE (pulmonary thromboembolism) ([place]) 11/04/2019  . Pneumonia   . Proliferative diabetic retinopathy ([place]) 09/29/2015  . 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 Reactive depression 12/10/2017   .  [name] Right-sided low back pain without sciatica 12/08/2015  . Secondary hyperthyroidism 11/27/2013  . Steal syndrome dialysis vascular access ([place]) 02/17/2018  . Thromboembolism ([place]) 04/05/2018  . Upper airway cough syndrome, with recs to  stay off ACE and take Pepcid q hs 11/15/2016   Patient Active Problem List   Diagnosis Date Noted  . Positive ANA (antinuclear antibody) 04/16/2020  . ESRD on dialysis ([place])   . Hemiplegia and hemiparesis following cerebral infarction affecting left non-dominant side ([place]) 12/29/2018  . IBS  (irritable bowel syndrome) 08/06/2017  . Dyslipidemia associated with type 2 diabetes mellitus ([place]) 06/25/2016  . Proliferative diabetic retinopathy ([place]) 09/29/2015  . B12 deficiency 05/10/2014  . Anemia in chronic kidney disease 04/26/2014  . CKD (chronic kidney disease) stage 5, GFR less than 15 ml/min (HCC) 04/26/2014  . Secondary hyperparathyroidism of renal origin ([place]) 11/27/2013  . Posterior subcapsular cataract, bilateral 10/18/2013  . Diabetic neuropathy, painful ([place]), on low dose Gabapentin 07/13/2013  . Diabetes mellitus, type II, insulin dependent ([place]), on CGM, with end organ damage: renal, neuropathy, gastroparesis, retinopathy 02/19/2011  . Hypertension associated with diabetes ([place]) 02/19/2011  . Gastroparesis due to DM ([place]) 02/19/2011   Past Surgical History:  Procedure Laterality Date  . A/V FISTULAGRAM Right 03/03/2020   Procedure: Venogram;  Surgeon: Waynetta Sandy, MD;  Location: [facility];  Service: Cardiovascular;  Laterality: Right;  . ARTERY REPAIR Right 02/17/2018   Procedure: EXPLORATION OF RIGHT BRACHIAL ARTERY;  Surgeon: Conrad Perley, MD;  Location: [place];  Service: Vascular;  Laterality: Right;  . ARTERY REPAIR Right 02/17/2018   Procedure: BRACHIAL ARTERY EXPLORATION AND TRHOMBECTOMY;  Surgeon: Conrad [place], MD;  Location: [facility] OR;  Service: Vascular;  Laterality: Right;  . AV FISTULA PLACEMENT Left 05/09/2017   Procedure: INSERTION OF ARTERIOVENOUS (AV) GRAFT ARM (ARTEGRAFT);  Surgeon: Conrad Springer, MD;  Location: [facility] OR;  Service: Vascular;  Laterality: Left;  . AV FISTULA PLACEMENT Right 02/17/2018   Procedure: INSERTION OF ARTERIOVENOUS (AV) GORE-TEX GRAFT ARM RIGHT UPPER ARM;  Surgeon: Conrad Alexander, MD;  Location: [place];  Service: Vascular;  Laterality: Right;  . AV FISTULA PLACEMENT Left 10/10/2019   Procedure: INSERTION OF LEFT ARTERIOVENOUS (AV) ARTEGRAFT GRAFT ARM;  Surgeon: Waynetta Sandy, MD;  Location: [place];  Service:  Vascular;  Laterality: Left;  . AV FISTULA PLACEMENT Right 03/19/2020   Procedure: INSERTION OF ARTERIOVENOUS (AV) GORE-TEX GRAFT IN RIGHT ARM AND VIABAHN STENT IN RIGHT AXILLARY ARTERY;  Surgeon: Waynetta Sandy, MD;  Location: [place];  Service: Vascular;  Laterality: Right;  . BASCILIC VEIN TRANSPOSITION Left 08/31/2016   Procedure: BASILIC VEIN TRANSPOSITION FIRST STAGE;  Surgeon: Conrad [place], MD;  Location: [place];  Service: Vascular;  Laterality: Left;  . CENTRAL VENOUS CATHETER INSERTION Left 07/24/2018   Procedure: INSERTION CENTRAL LINE ADULT;  Surgeon: Conrad Fontanet, MD;  Location: [place];  Service: Vascular;  Laterality: Left;  . EYE SURGERY     secondary to diabetic retinopathy   . FOOT SURGERY Right    t"ook bone out- maybe hammer toe"  . INSERTION OF DIALYSIS CATHETER Left 07/24/2018   Procedure: INSERTION OF TUNNELED DIALYSIS CATHETER;  Surgeon: Conrad Methow, MD;  Location: [place];  Service: Vascular;  Laterality: Left;  . IR FLUORO GUIDE CV LINE RIGHT  07/21/2018  . IR FLUORO GUIDE CV LINE RIGHT  06/01/2019  . IR US GUIDE VASC ACCESS RIGHT  07/21/2018  . IR US GUIDE VASC ACCESS RIGHT  06/01/2019  . LIGATION ARTERIOVENOUS GORTEX GRAFT Right 03/20/2020   Procedure: LIGATION ARM ARTERIOVENOUS GORTEX GRAFT With Patch Angioplasty, Thrombectomy;  Surgeon: Waynetta Sandy, MD;  Location: [facility];  Service: Vascular;  Laterality: Right;  . LIGATION OF ARTERIOVENOUS  FISTULA Left 05/09/2017   Procedure: LIGATION OF ARTERIOVENOUS  FISTULA;  Surgeon: Conrad Cear[place]oss, MD;  Location: [place];  Service: Vascular;  Laterality: Left;  . LOOP RECORDER INSERTION N/A 12/05/2017   Procedure: LOOP RECORDER INSERTION;  Surgeon: Evans Lance, MD;  Location: [place] [facility];  Service: Cardiovascular;  Laterality: N/A;  . MYOMECTOMY    . REMOVAL OF GRAFT Right 02/17/2018   Procedure: REMOVAL OF RIGHT UPPER ARM ARTERIOVENOUS GRAFT;  Surgeon: Conrad Hertford, MD;  Location: [facility];  Service: Vascular;   Laterality: Right;  . REVISION OF ARTERIOVENOUS GORETEX GRAFT Left 07/24/2018   Procedure: REDO ARTERIOVENOUS GORETEX GRAFT;  Surgeon: Conrad [place], MD;  Location: [facility];  Service: Vascular;  Laterality: Left;  . TEE WITHOUT CARDIOVERSION N/A 12/05/2017   Procedure: TRANSESOPHAGEAL ECHOCARDIOGRAM (TEE);  Surgeon: Sanda Klein, MD;  Location: [facility] ENDOSCOPY;  Service: Cardiovascular;  Laterality: N/A;  . UPPER EXTREMITY VENOGRAPHY Left 07/13/2018   Procedure: UPPER EXTREMITY VENOGRAPHY - Central & Left Arm;  Surgeon: Conrad [place], MD;  Location: [place] [facility];  Service: Cardiovascular;  Laterality: Left;  . UPPER EXTREMITY VENOGRAPHY Bilateral 09/10/2019   Procedure: UPPER EXTREMITY VENOGRAPHY;  Surgeon: Waynetta Sandy, MD;  Location: [place] [facility];  Service: Cardiovascular;  Laterality: Bilateral;  . UTERINE FIBROID SURGERY    . VITRECTOMY Bilateral     Family History  Problem Relation Age of Onset  . Colon polyps Mother   . Diabetes Mother   . Heart murmur Father        history essentially unknown  . Hypertension Father   . Diabetes Sister   . Kidney failure Sister        kidney transplant  . Diabetes Brother   . Retinal degeneration Brother   . Heart murmur Son   . Stroke Paternal Grandmother   . Diabetes Sister     Medications- reviewed and updated Current Outpatient Medications  Medication Sig Dispense Refill  . aspirin-acetaminophen-caffeine (EXCEDRIN MIGRAINE) 250-250-65 MG tablet Take 1 tablet by mouth daily as needed for headache.    . Continuous Blood Gluc Sensor (FREESTYLE LIBRE 14 DAY SENSOR) MISC 1 patch by Does not apply route every 14 (fourteen) days. 2 each 6  . ELIQUIS 5 MG TABS tablet Take 5 mg by mouth 2 (two) times daily.     [name] gabapentin (NEURONTIN) 300 MG capsule Take 1 capsule (300 mg total) by mouth daily as needed (for pain). Take after dialysis 180 capsule 5  . insulin aspart (NOVOLOG FLEXPEN) 100 UNIT/ML FlexPen Inject 10 Units into  the skin 3 (three) times daily with meals. Sliding scale (Patient taking differently: Inject 6 Units into the skin 3 (three) times daily with meals. Sliding scale) 27 mL 3  . insulin degludec (TRESIBA FLEXTOUCH) 100 UNIT/ML SOPN FlexTouch Pen Inject 0.26 mLs (26 Units total) into the skin at bedtime. (Patient taking differently: Inject 28 Units into the skin at bedtime. ) 24 mL 3  . Insulin Pen Needle 29G X 5MM MISC 1 Device by Does not apply route 4 (four) times daily. 400 each 3  . lubiprostone (AMITIZA) 24 MCG capsule Take 1 capsule (24 mcg total) by mouth 2 (two) times daily with a meal. (Patient taking differently: Take 24 mcg by mouth daily as needed for constipation. )    . Methoxy PEG-Epoetin Beta (MIRCERA IJ) Mircera    . midodrine (PROAMATINE) 5 MG  tablet TAKE 1 TABLET (5 MG TOTAL) BY MOUTH 2 (TWO) TIMES DAILY WITH A MEAL. (Patient taking differently: Take 5 mg by mouth daily as needed (hypotension). ) 180 tablet 0  . montelukast (SINGULAIR) 10 MG tablet Take 1 tablet (10 mg total) by mouth at bedtime. 30 tablet 3  . promethazine (PHENERGAN) 12.5 MG tablet Take 1 tablet (12.5 mg total) by mouth every 8 (eight) hours as needed. 30 tablet 3   No current facility-administered medications for this visit.    Allergies-reviewed and updated Allergies  Allergen Reactions  . Ibuprofen Other (See Comments)    CKD stage 3. Should avoid.  . Dilaudid [Hydromorphone Hcl] Itching and Other (See Comments)    Can take with Benadryl.  . Tramadol Itching    Social History   Socioeconomic History  . Marital status: Married    Spouse name: Not on file  . Number of children: 1  . Years of education: college  . Highest education level: Not on file  Occupational History  . Occupation: Armed forces technical officer: [employer]  Tobacco Use  . Smoking status: Never Smoker  . Smokeless tobacco: Never Used  Substance and Sexual Activity  . Alcohol use: No    Alcohol/week: 0.0 standard  drinks  . Drug use: No  . Sexual activity: Not Currently    Partners: Male    Birth control/protection: I.U.D.  Other Topics Concern  . Not on file  Social History Narrative   Patient lives with fiance. She has 1 grown son. Works full-time, she Paramedic for attorney.   Caffeine Use: 2 cups daily; sodas occasionally   She has 7 grandchildren   Social Determinants of Radio broadcast assistant Strain:   . Difficulty of Paying Living Expenses:   Food Insecurity:   . Worried About Charity fundraiser in the Last Year:   . Arboriculturist in the Last Year:   Transportation Needs:   . Film/video editor (Medical):   [name] Lack of Transportation (Non-Medical):   Physical Activity:   . Days of Exercise per Week:   . Minutes of Exercise per Session:   Stress:   . Feeling of Stress :   Social Connections:   . Frequency of Communication with Friends and Family:   . Frequency of Social Gatherings with Friends and Family:   . Attends Religious Services:   . Active Member of Clubs or Organizations:   . Attends Archivist Meetings:   [name] Marital Status:           Objective:  Physical Exam: BP 128/78 (BP Location: Left Arm, Patient Position: Sitting, Cuff Size: Large)   Pulse 88   Temp 98.2 F (36.8 C) (Temporal)   Ht 5\' 8"  (1.727 m)   Wt 220 lb (99.8 kg)   SpO2 99%   BMI 33.45 kg/m   Gen: No acute distress, resting comfortably CV: Regular rate and rhythm with no murmurs appreciated Pulm: Normal work of breathing, clear to auscultation bilaterally with no crackles, wheezes, or rhonchi Neuro: Grossly normal, moves all extremities Psych: Normal affect and thought content  Time Spent: 47 minutes of total time was spent on the date of the encounter performing the following actions: chart review prior to seeing the patient, obtaining history, performing a medically necessary exam, counseling on the treatment plan, placing orders, and documenting in our EHR.         [name]. Jerline Pain, MD  04/16/2020 10:07 AM

## 2020-04-16 NOTE — Assessment & Plan Note (Signed)
Continue management per nephrology

## 2020-04-16 NOTE — Assessment & Plan Note (Signed)
A1c 7.8.  Fasting sugars in upper 100s.  Will increase Tresiba to 30 units daily.  Continue NovoLog 6 units 3 times daily with meals.  Continue working on diet and exercise.  She will check with her insurance company to see which insulins are covered.  Follow-up in 3 months and recheck A1c at that time.

## 2020-04-16 NOTE — Assessment & Plan Note (Signed)
Controlled without blood pressure meds.  Will take lisinopril, Coreg, and amlodipine off of her medication list.

## 2020-04-16 NOTE — Assessment & Plan Note (Signed)
Will refill gabapentin.  Instructed patient to only take 3 times weekly after dialysis.

## 2020-04-16 NOTE — Assessment & Plan Note (Signed)
Continue Amitiza as needed.

## 2020-04-16 NOTE — Patient Instructions (Signed)
It was very nice to see you today!  Please increase your [name] to 30 units daily.  Please start the Singulair to help with your allergies.  I will refill your gabapentin and Phenergan today.  Come back in 3 months, or sooner if needed.  Take care, Dr Jerline Pain  Please try these tips to maintain a healthy lifestyle:   Eat at least 3 REAL meals and 1-2 snacks per day.  Aim for no more than 5 hours between eating.  If you eat breakfast, please do so within one hour of getting up.    Each meal should contain half fruits/vegetables, one quarter protein, and one quarter carbs (no bigger than a computer mouse)   Cut down on sweet beverages. This includes juice, soda, and sweet tea.     Drink at least 1 glass of water with each meal and aim for at least 8 glasses per day   Exercise at least 150 minutes every week.

## 2020-04-24 ENCOUNTER — Telehealth: Payer: Self-pay | Admitting: Family Medicine

## 2020-04-24 ENCOUNTER — Telehealth: Payer: Self-pay | Admitting: Rheumatology

## 2020-04-24 NOTE — Telephone Encounter (Signed)
Offered a appt with Inda Coke patient declined.

## 2020-04-24 NOTE — Telephone Encounter (Signed)
Pt called stating she is experiencing vaginal discharge and pain. Pt is requesting Dr. Jerline Pain send a script in. Please advise.

## 2020-04-24 NOTE — Telephone Encounter (Signed)
Tim from [facility] called stating patient's lab test has been cancelled due to the bloodwork taken was serum and they needed whole blood.  Tim states the patient will not be charged.  Patient will need new paperwork and will have to call to reschedule her labwork at [facility].  If you have any questions, please call [phone]

## 2020-04-24 NOTE — Telephone Encounter (Signed)
Attempted to call patient,not able to leave voice message due to voice mail not set up

## 2020-04-25 ENCOUNTER — Other Ambulatory Visit: Payer: Self-pay

## 2020-04-25 ENCOUNTER — Encounter: Payer: Self-pay | Admitting: Vascular Surgery

## 2020-04-25 ENCOUNTER — Ambulatory Visit (INDEPENDENT_AMBULATORY_CARE_PROVIDER_SITE_OTHER): Payer: Self-pay | Admitting: Vascular Surgery

## 2020-04-25 ENCOUNTER — Ambulatory Visit: Payer: Managed Care, Other (non HMO) | Admitting: Rheumatology

## 2020-04-25 VITALS — BP 179/99 | HR 64 | Resp 16 | Ht 68.0 in | Wt 223.0 lb

## 2020-04-25 DIAGNOSIS — Z992 Dependence on renal dialysis: Secondary | ICD-10-CM

## 2020-04-25 DIAGNOSIS — N186 End stage renal disease: Secondary | ICD-10-CM

## 2020-04-25 NOTE — Telephone Encounter (Signed)
Spoke with patient and advised. Order has been faxed to [facility] and patient will call to schedule an appointment.

## 2020-04-25 NOTE — Progress Notes (Signed)
    Subjective:     Patient ID: Heather [name], female   DOB: 09/26/1971, 49 y.o.   MRN: [id number]  HPI 49 year old female follows up after placement of right arm AV graft subsequent removal of the graft the following day after having significant pain in right hand dysfunction.  She was noted to have palpable pulse afterwards.  [name] here today to discuss possible lower extremity access given that she is currently on dialysis via catheter.  Unfortunately she continues to have right hand dysfunction.  She states she cannot write with a pencil.  Her medial three fingers are essentially nonfunctional in her index finger and thumb have minimal motion.  This is only gotten some better since the time of surgery.   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  Given the short time since surgery I have recommended occupational therapy will make this referral today.  Patient may ultimately require further intervention with nerve conduction studies and referral to hand surgery but we will follow her up in a few weeks to see how well she is doing with the occupational therapy.  We are waiting on permanent access placement given the patient would need left thigh AV graft and she does have a wound on the lateral left foot that is almost healed at this time.    Taren Toops C. Donzetta Matters, MD [facility] Office: [phone] Pager: [phone]

## 2020-05-02 ENCOUNTER — Other Ambulatory Visit (HOSPITAL_COMMUNITY): Payer: Self-pay | Admitting: Nephrology

## 2020-05-02 DIAGNOSIS — N186 End stage renal disease: Secondary | ICD-10-CM

## 2020-05-05 ENCOUNTER — Ambulatory Visit (HOSPITAL_COMMUNITY): Payer: Managed Care, Other (non HMO) | Admitting: Certified Registered"

## 2020-05-05 ENCOUNTER — Inpatient Hospital Stay (HOSPITAL_COMMUNITY)
Admission: RE | Admit: 2020-05-05 | Discharge: 2020-05-07 | DRG: 296 | Disposition: A | Discharge status: Home / Self Care | Payer: Managed Care, Other (non HMO) | Source: Ambulatory Visit | Attending: Internal Medicine | Admitting: Internal Medicine

## 2020-05-05 ENCOUNTER — Other Ambulatory Visit: Payer: Self-pay

## 2020-05-05 ENCOUNTER — Other Ambulatory Visit (HOSPITAL_COMMUNITY): Payer: Self-pay | Admitting: Nephrology

## 2020-05-05 ENCOUNTER — Inpatient Hospital Stay (HOSPITAL_COMMUNITY): Payer: Managed Care, Other (non HMO)

## 2020-05-05 ENCOUNTER — Encounter (HOSPITAL_COMMUNITY): Payer: Self-pay | Admitting: Certified Registered"

## 2020-05-05 DIAGNOSIS — Z794 Long term (current) use of insulin: Secondary | ICD-10-CM | POA: Diagnosis not present

## 2020-05-05 DIAGNOSIS — E0590 Thyrotoxicosis, unspecified without thyrotoxic crisis or storm: Secondary | ICD-10-CM | POA: Diagnosis present

## 2020-05-05 DIAGNOSIS — Z833 Family history of diabetes mellitus: Secondary | ICD-10-CM

## 2020-05-05 DIAGNOSIS — N186 End stage renal disease: Secondary | ICD-10-CM | POA: Diagnosis present

## 2020-05-05 DIAGNOSIS — Z20822 Contact with and (suspected) exposure to covid-19: Secondary | ICD-10-CM | POA: Diagnosis present

## 2020-05-05 DIAGNOSIS — Z992 Dependence on renal dialysis: Secondary | ICD-10-CM | POA: Diagnosis not present

## 2020-05-05 DIAGNOSIS — K3184 Gastroparesis: Secondary | ICD-10-CM | POA: Diagnosis present

## 2020-05-05 DIAGNOSIS — E1143 Type 2 diabetes mellitus with diabetic autonomic (poly)neuropathy: Secondary | ICD-10-CM | POA: Diagnosis present

## 2020-05-05 DIAGNOSIS — Z8673 Personal history of transient ischemic attack (TIA), and cerebral infarction without residual deficits: Secondary | ICD-10-CM

## 2020-05-05 DIAGNOSIS — K219 Gastro-esophageal reflux disease without esophagitis: Secondary | ICD-10-CM | POA: Diagnosis present

## 2020-05-05 DIAGNOSIS — Z86711 Personal history of pulmonary embolism: Secondary | ICD-10-CM

## 2020-05-05 DIAGNOSIS — Z7901 Long term (current) use of anticoagulants: Secondary | ICD-10-CM

## 2020-05-05 DIAGNOSIS — D696 Thrombocytopenia, unspecified: Secondary | ICD-10-CM | POA: Diagnosis present

## 2020-05-05 DIAGNOSIS — T82898A Other specified complication of vascular prosthetic devices, implants and grafts, initial encounter: Secondary | ICD-10-CM | POA: Diagnosis present

## 2020-05-05 DIAGNOSIS — Z885 Allergy status to narcotic agent status: Secondary | ICD-10-CM

## 2020-05-05 DIAGNOSIS — Z8371 Family history of colonic polyps: Secondary | ICD-10-CM

## 2020-05-05 DIAGNOSIS — R0689 Other abnormalities of breathing: Secondary | ICD-10-CM | POA: Diagnosis present

## 2020-05-05 DIAGNOSIS — E1122 Type 2 diabetes mellitus with diabetic chronic kidney disease: Secondary | ICD-10-CM | POA: Diagnosis present

## 2020-05-05 DIAGNOSIS — Z823 Family history of stroke: Secondary | ICD-10-CM | POA: Diagnosis not present

## 2020-05-05 DIAGNOSIS — N2581 Secondary hyperparathyroidism of renal origin: Secondary | ICD-10-CM | POA: Diagnosis present

## 2020-05-05 DIAGNOSIS — I469 Cardiac arrest, cause unspecified: Secondary | ICD-10-CM | POA: Diagnosis present

## 2020-05-05 DIAGNOSIS — M797 Fibromyalgia: Secondary | ICD-10-CM | POA: Diagnosis present

## 2020-05-05 DIAGNOSIS — E785 Hyperlipidemia, unspecified: Secondary | ICD-10-CM | POA: Diagnosis present

## 2020-05-05 DIAGNOSIS — D631 Anemia in chronic kidney disease: Secondary | ICD-10-CM | POA: Diagnosis present

## 2020-05-05 DIAGNOSIS — I468 Cardiac arrest due to other underlying condition: Secondary | ICD-10-CM | POA: Diagnosis not present

## 2020-05-05 HISTORY — PX: IR FLUORO GUIDE CV LINE RIGHT: IMG2283

## 2020-05-05 LAB — CBC WITH DIFFERENTIAL/PLATELET
Abs Immature Granulocytes: 0.08 10*3/uL — ABNORMAL HIGH (ref 0.00–0.07)
Basophils Absolute: 0 10*3/uL (ref 0.0–0.1)
Basophils Relative: 0 %
Eosinophils Absolute: 0 10*3/uL (ref 0.0–0.5)
Eosinophils Relative: 0 %
HCT: 30.4 % — ABNORMAL LOW (ref 36.0–46.0)
Hemoglobin: 8.9 g/dL — ABNORMAL LOW (ref 12.0–15.0)
Immature Granulocytes: 1 %
Lymphocytes Relative: 58 %
Lymphs Abs: 4.3 10*3/uL — ABNORMAL HIGH (ref 0.7–4.0)
MCH: 29.6 pg (ref 26.0–34.0)
MCHC: 29.3 g/dL — ABNORMAL LOW (ref 30.0–36.0)
MCV: 101 fL — ABNORMAL HIGH (ref 80.0–100.0)
Monocytes Absolute: 0.2 10*3/uL (ref 0.1–1.0)
Monocytes Relative: 2 %
Neutro Abs: 3 10*3/uL (ref 1.7–7.7)
Neutrophils Relative %: 39 %
Platelets: 236 10*3/uL (ref 150–400)
RBC: 3.01 MIL/uL — ABNORMAL LOW (ref 3.87–5.11)
RDW: 14.7 % (ref 11.5–15.5)
WBC: 7.6 10*3/uL (ref 4.0–10.5)
nRBC: 0 % (ref 0.0–0.2)

## 2020-05-05 LAB — TROPONIN I (HIGH SENSITIVITY)
Troponin I (High Sensitivity): 12 ng/L (ref <18)
Troponin I (High Sensitivity): 17 ng/L (ref <18)

## 2020-05-05 LAB — GLUCOSE, CAPILLARY
Glucose-Capillary: 226 mg/dL — ABNORMAL HIGH (ref 70–99)
Glucose-Capillary: 204 mg/dL — ABNORMAL HIGH (ref 70–99)
Glucose-Capillary: 215 mg/dL — ABNORMAL HIGH (ref 70–99)
Glucose-Capillary: 185 mg/dL — ABNORMAL HIGH (ref 70–99)

## 2020-05-05 LAB — COMPREHENSIVE METABOLIC PANEL
ALT: 79 U/L — ABNORMAL HIGH (ref 0–44)
AST: 75 U/L — ABNORMAL HIGH (ref 15–41)
Albumin: 2.3 g/dL — ABNORMAL LOW (ref 3.5–5.0)
Alkaline Phosphatase: 93 U/L (ref 38–126)
Anion gap: 13 (ref 5–15)
BUN: 43 mg/dL — ABNORMAL HIGH (ref 6–20)
CO2: 18 mmol/L — ABNORMAL LOW (ref 22–32)
Calcium: 8 mg/dL — ABNORMAL LOW (ref 8.9–10.3)
Chloride: 105 mmol/L (ref 98–111)
Creatinine, Ser: 10.09 mg/dL — ABNORMAL HIGH (ref 0.44–1.00)
GFR calc Af Amer: 5 mL/min — ABNORMAL LOW (ref >60)
GFR calc non Af Amer: 4 mL/min — ABNORMAL LOW (ref >60)
Glucose, Bld: 269 mg/dL — ABNORMAL HIGH (ref 70–99)
Potassium: 4.4 mmol/L (ref 3.5–5.1)
Sodium: 136 mmol/L (ref 135–145)
Total Bilirubin: 0.4 mg/dL (ref 0.3–1.2)
Total Protein: 5.6 g/dL — ABNORMAL LOW (ref 6.5–8.1)

## 2020-05-05 LAB — POCT I-STAT, CHEM 8
BUN: 41 mg/dL — ABNORMAL HIGH (ref 6–20)
Calcium, Ion: 1.17 mmol/L (ref 1.15–1.40)
Chloride: 105 mmol/L (ref 98–111)
Creatinine, Ser: 10.2 mg/dL — ABNORMAL HIGH (ref 0.44–1.00)
Glucose, Bld: 188 mg/dL — ABNORMAL HIGH (ref 70–99)
HCT: 32 % — ABNORMAL LOW (ref 36.0–46.0)
Hemoglobin: 10.9 g/dL — ABNORMAL LOW (ref 12.0–15.0)
Potassium: 4.6 mmol/L (ref 3.5–5.1)
Sodium: 138 mmol/L (ref 135–145)
TCO2: 21 mmol/L — ABNORMAL LOW (ref 22–32)

## 2020-05-05 LAB — MRSA PCR SCREENING: MRSA by PCR: NEGATIVE

## 2020-05-05 LAB — SARS CORONAVIRUS 2 BY RT PCR (HOSPITAL ORDER, PERFORMED IN ~~LOC~~ HOSPITAL LAB): SARS Coronavirus 2: NEGATIVE

## 2020-05-05 LAB — MAGNESIUM: Magnesium: 1.8 mg/dL (ref 1.7–2.4)

## 2020-05-05 LAB — PHOSPHORUS: Phosphorus: 4.8 mg/dL — ABNORMAL HIGH (ref 2.5–4.6)

## 2020-05-05 MED ORDER — HEPARIN SODIUM (PORCINE) 5000 UNIT/ML IJ SOLN: 5000.0000 [IU] | Freq: Three times a day (TID) | INTRAMUSCULAR | Status: DC

## 2020-05-05 MED ORDER — FENTANYL CITRATE (PF) 100 MCG/2ML IJ SOLN
INTRAMUSCULAR | Status: AC
  Filled 2020-05-05: qty 2

## 2020-05-05 MED ORDER — DOCUSATE SODIUM 100 MG PO CAPS: 100.0000 mg | ORAL_CAPSULE | Freq: Two times a day (BID) | ORAL | Status: DC | PRN

## 2020-05-05 MED ORDER — LIDOCAINE HCL 1 % IJ SOLN
INTRAMUSCULAR | Status: DC | PRN
  Administered 2020-05-05: 5 mL

## 2020-05-05 MED ORDER — LABETALOL HCL 5 MG/ML IV SOLN: 10.0000 mg | Freq: Once | INTRAVENOUS | Status: AC

## 2020-05-05 MED ORDER — SODIUM CHLORIDE 0.9 % IV SOLN: INTRAVENOUS | Status: DC

## 2020-05-05 MED ORDER — DOXERCALCIFEROL 4 MCG/2ML IV SOLN
2.0000 ug | INTRAVENOUS | Status: DC
  Filled 2020-05-05: qty 2

## 2020-05-05 MED ORDER — LABETALOL HCL 5 MG/ML IV SOLN
INTRAVENOUS | Status: AC
  Administered 2020-05-05: 10 mg via INTRAVENOUS
  Filled 2020-05-05: qty 4

## 2020-05-05 MED ORDER — POLYETHYLENE GLYCOL 3350 17 G PO PACK: 17.0000 g | PACK | Freq: Every day | ORAL | Status: DC | PRN

## 2020-05-05 MED ORDER — MIDAZOLAM HCL 2 MG/2ML IJ SOLN
INTRAMUSCULAR | Status: AC
  Filled 2020-05-05: qty 2

## 2020-05-05 MED ORDER — CEFAZOLIN SODIUM-DEXTROSE 2-4 GM/100ML-% IV SOLN
INTRAVENOUS | Status: AC
  Administered 2020-05-05: 2 g via INTRAVENOUS
  Filled 2020-05-05: qty 100

## 2020-05-05 MED ORDER — INSULIN ASPART 100 UNIT/ML ~~LOC~~ SOLN
0.0000 [IU] | SUBCUTANEOUS | Status: DC
  Administered 2020-05-05: 3 [IU] via SUBCUTANEOUS
  Administered 2020-05-05: 22:00:00 2 [IU] via SUBCUTANEOUS
  Administered 2020-05-06: 3 [IU] via SUBCUTANEOUS
  Administered 2020-05-06: 1 [IU] via SUBCUTANEOUS
  Administered 2020-05-06: 2 [IU] via SUBCUTANEOUS
  Administered 2020-05-06: 1 [IU] via SUBCUTANEOUS

## 2020-05-05 MED ORDER — CHLORHEXIDINE GLUCONATE 4 % EX LIQD
CUTANEOUS | Status: AC
  Filled 2020-05-05: qty 15

## 2020-05-05 MED ORDER — APIXABAN 5 MG PO TABS
5.0000 mg | ORAL_TABLET | Freq: Two times a day (BID) | ORAL | Status: DC
  Administered 2020-05-05 – 2020-05-07 (×4): 5 mg via ORAL
  Filled 2020-05-05 (×4): qty 1

## 2020-05-05 MED ORDER — SODIUM CHLORIDE 0.9% FLUSH: 10.0000 mL | INTRAVENOUS | Status: DC | PRN

## 2020-05-05 MED ORDER — SODIUM CHLORIDE 0.9 % IV SOLN: INTRAVENOUS | Status: DC | PRN

## 2020-05-05 MED ORDER — SODIUM CHLORIDE 0.9% FLUSH
10.0000 mL | Freq: Two times a day (BID) | INTRAVENOUS | Status: DC
  Administered 2020-05-05 – 2020-05-06 (×3): 10 mL

## 2020-05-05 MED ORDER — CEFAZOLIN SODIUM-DEXTROSE 2-4 GM/100ML-% IV SOLN: 2.0000 g | Freq: Once | INTRAVENOUS | Status: AC

## 2020-05-05 MED ORDER — CHLORHEXIDINE GLUCONATE CLOTH 2 % EX PADS
6.0000 | MEDICATED_PAD | Freq: Every day | CUTANEOUS | Status: DC
  Administered 2020-05-05 – 2020-05-06 (×2): 6 via TOPICAL

## 2020-05-05 MED ORDER — CHLORHEXIDINE GLUCONATE CLOTH 2 % EX PADS
6.0000 | MEDICATED_PAD | Freq: Every day | CUTANEOUS | Status: DC
  Administered 2020-05-06: 6 via TOPICAL

## 2020-05-05 MED ORDER — ONDANSETRON HCL 4 MG/2ML IJ SOLN: 4.0000 mg | Freq: Four times a day (QID) | INTRAMUSCULAR | Status: DC | PRN

## 2020-05-05 MED ORDER — HEPARIN SODIUM (PORCINE) 1000 UNIT/ML DIALYSIS
1000.0000 [IU] | INTRAMUSCULAR | Status: DC | PRN
  Filled 2020-05-05: qty 6

## 2020-05-05 MED ORDER — LIDOCAINE HCL 1 % IJ SOLN
INTRAMUSCULAR | Status: AC
  Filled 2020-05-05: qty 20

## 2020-05-05 MED ORDER — HEPARIN SODIUM (PORCINE) 1000 UNIT/ML IJ SOLN
INTRAMUSCULAR | Status: AC
  Filled 2020-05-05: qty 1

## 2020-05-05 MED ORDER — MIDODRINE HCL 5 MG PO TABS
5.0000 mg | ORAL_TABLET | Freq: Two times a day (BID) | ORAL | Status: DC
  Administered 2020-05-05 – 2020-05-06 (×2): 5 mg via ORAL
  Filled 2020-05-05 (×3): qty 1

## 2020-05-05 MED ORDER — LABETALOL HCL 5 MG/ML IV SOLN
10.0000 mg | Freq: Once | INTRAVENOUS | Status: AC
  Administered 2020-05-05: 10 mg via INTRAVENOUS

## 2020-05-05 NOTE — H&P (Signed)
NAME:  Heather [name], MRN:  [id number], DOB:  Sep 03, 1971, LOS: 0 ADMISSION DATE:  05/05/2020, CONSULTATION DATE:  05/05/2020 REFERRING MD:  Dr. Augustin Coupe, CHIEF COMPLAINT:  Cardiac arrest    Brief History   49yo female presented for elective exchange of her Rt tunneled dialysis catheter. While in IR patient suffered PEA arrest with less than 5 min down time. PCCM consulted for admission.   History of present illness   Heather [name] is a 49 year old female with an extensive past medical history including but not limited to prior CVA in 2018, type 2 diabetes, dyslipidemia, end-stage renal disease on hemodialysis, GERD, hypertension who presented for elective exchange of right tunneled HD catheter by IR.  PCCM was called to interventional radiology for CODE BLUE.  It was reported patient was significantly hypertensive during case at which time she received labetalol after which patient was seen in witnessed PEA arrest.  ACLS protocol initiated with quick, less than 5-minute, ROSC was achieved.  Per rapid response patient was seen bradycardic in the 40s post ROSC at which time she received atropine.  Due to ineffective mentation patient was electively intubated in IR.  Upon arrival to IR suite patient was slowly waking up with intentional attempts to remove ET tube.  Past Medical History  Prior CVA 2018 Type 2 diabetes Diabetic neuropathy Dyslipidemia End-stage renal disease on hemodialysis Fibromyalgia Gastroparesis GERD Hypertension Prior PE on Eliquis  Secondary hyperthyroidism Thrombocytopenia Still syndrome dialysis vascular access  Significant Hospital Events   5/10 admitted after PEA arrest  Consults:    Procedures:  5/10 exchange of right tunneled HD catheter  Significant Diagnostic Tests:  ECHO PTA 02/08/2019 1. The left ventricle has normal systolic function with an ejection  fraction of 60-65%. The cavity size was normal. There is mildly increased  left ventricular  wall thickness. Left ventricular diastolic Doppler  parameters are consistent with impaired  relaxation Elevated mean left atrial pressure.  2. The right ventricle has normal systolic function. The cavity was  normal. There is no increase in right ventricular wall thickness.  3. Small pericardial effusion.  4. The mitral valve is normal in structure.  5. The tricuspid valve is normal in structure.  6. The aortic valve is tricuspid.  7. The pulmonic valve was normal in structure.   Micro Data:    Antimicrobials:     Interim history/subjective:  Lying on bed in IR suite, seen with intentional attempt to remove ET tube  Objective   Blood pressure (!) 196/97, pulse 85, resp. rate 16, height 5\' 8"  (1.727 m), weight 101.6 kg, SpO2 100 %.       No intake or output data in the 24 hours ending 05/05/20 1708 Filed Weights   05/05/20 1341  Weight: 101.6 kg    Examination: General: Chronically ill appearing elderly female on mechanical ventilation, in NAD HEENT: ETT, MM pink/moist, PERRL, sclera non-icteric  Neuro: Lethargic but easily arousable, able to follow simple commands, non-focal  CV: s1s2 regular rate and rhythm, no murmur, rubs, or gallops,  PULM:  Clear to ascultation, no added breath sounds, no increased work of breathing, able to wean on vent and is alert and oriented post arrest  GI: soft, bowel sounds active in all 4 quadrants, non-tender, non-distended Extremities: warm/dry, no edema  Skin: no rashes or lesions   Resolved Hospital Problem list     Assessment & Plan:  PEA arrest -While in IR patient suffered PEA arrest with less than 5 min down time.  Respiratory insuffiencey  P: Intubated due to decreased mentation post arrest, alert and oriented now will extubate Trend troponin Obtain EKG  Continuous telemetry  Obtain ECHO  Wean neo for MAP greater than 65 Head of bed elevated 30 degrees. Obtain CXR  ESRD on iHD  -Last reported iHD 5/8 P: Consult  Nephrology  Monitor volume status  Renal diet when appropriate  Trend Bmet  Appears to be on midodrine scheduled vs PRN at home will resume   Type 2 diabetes  P: SSI  CBG q4hrs until diet then ACHS   Hx of prior PE on chronic anticoagulation  -Home medication includes PE P: Resume home anticoagulation   Best practice:  Diet: NPO until bedside eval can be completed  Pain/Anxiety/Delirium protocol (if indicated): PRNs VAP protocol (if indicated): In place  DVT prophylaxis: Subq heparin  GI prophylaxis: PPI Glucose control: SSI  Mobility: Up with assistance once able  Code Status: Full Family Communication: Family updated at bedside Disposition: ICU  Labs   CBC: Recent Labs  Lab 05/05/20 1557  HGB 10.9*  HCT 32.0*    Basic Metabolic Panel: Recent Labs  Lab 05/05/20 1557  NA 138  K 4.6  CL 105  GLUCOSE 188*  BUN 41*  CREATININE 10.20*   GFR: Estimated Creatinine Clearance: 8.2 mL/min (A) (by C-G formula based on SCr of 10.2 mg/dL (H)). No results for input(s): PROCALCITON, WBC, LATICACIDVEN in the last 168 hours.  Liver Function Tests: No results for input(s): AST, ALT, ALKPHOS, BILITOT, PROT, ALBUMIN in the last 168 hours. No results for input(s): LIPASE, AMYLASE in the last 168 hours. No results for input(s): AMMONIA in the last 168 hours.  ABG    Component Value Date/Time   HCO3 24.8 (H) 06/23/2015 1343   TCO2 21 (L) 05/05/2020 1557   ACIDBASEDEF 1.0 06/23/2015 1343   O2SAT 79.0 06/23/2015 1343     Coagulation Profile: No results for input(s): INR, PROTIME in the last 168 hours.  Cardiac Enzymes: No results for input(s): CKTOTAL, CKMB, CKMBINDEX, TROPONINI in the last 168 hours.  HbA1C: Hemoglobin A1C  Date/Time Value Ref Range Status  04/16/2020 09:38 AM 7.8 (A) 4.0 - 5.6 % Final  12/26/2019 10:16 AM 7.2 (A) 4.0 - 5.6 % Final  11/02/2019 12:00 AM 7.3  Final  04/19/2017 12:00 AM 7.4  Final   Hgb A1c MFr Bld  Date/Time Value Ref Range  Status  05/31/2019 10:46 AM 9.1 (H) 4.8 - 5.6 % Final    Comment:    (NOTE) Pre diabetes:          5.7%-6.4% Diabetes:              >6.4% Glycemic control for   <7.0% adults with diabetes   02/07/2019 06:25 AM 9.9 (H) 4.8 - 5.6 % Final    Comment:    (NOTE) Pre diabetes:          5.7%-6.4% Diabetes:              >6.4% Glycemic control for   <7.0% adults with diabetes     CBG: Recent Labs  Lab 05/05/20 1356 05/05/20 1638  GLUCAP 226* 204*    Review of Systems:   Unable to gather   Past Medical History  She,  has a past medical history of Antiplatelet or antithrombotic long-term use: Plavix (06/30/2017), B12 deficiency (05/10/2014), Blood transfusion without reported diagnosis, Chronic constipation, CVA (cerebral vascular accident) ([place]), nonhemorrhagic, inferior right cerebellum (12/03/2017), Cyst of left ovary (01/12/2018), Diabetic  neuropathy, painful ([place]), on low dose Gabapentin (07/13/2013), DM (diabetes mellitus), type 2, uncontrolled, with renal complications ([place]) (26/94/8546), DM2 (diabetes mellitus, type 2) ([place]), Dysfunction of left eustachian tube, with pusatile tinnitus (11/24/2017), Dyslipidemia associated with type 2 diabetes mellitus ([place]) (06/25/2016), ESRD (end stage renal disease) on dialysis ([place]), ESRD with anemia ([place]), Fibromyalgia, Gastroparesis due to DM, GERD (gastroesophageal reflux disease), Headache, Hypertension associated with diabetes ([place]) (02/19/2011), IBS (irritable bowel syndrome), Left-sided weakness (12/10/2017), Leiomyoma of uterus, Pancreatitis, PE (pulmonary thromboembolism) ([place]) (11/04/2019), Pneumonia, Proliferative diabetic retinopathy ([place]) (09/29/2015), Pronation deformity of both feet (06/14/2014), Reactive depression (12/10/2017), Right-sided low back pain without sciatica (12/08/2015), Secondary hyperthyroidism (11/27/2013), Steal syndrome dialysis vascular access ([place]) (02/17/2018), Thromboembolism ([place]) (04/05/2018), and Upper airway cough  syndrome, with recs to stay off ACE and take Pepcid q hs (11/15/2016).   Surgical History    Past Surgical History:  Procedure Laterality Date  . A/V FISTULAGRAM Right 03/03/2020   Procedure: Venogram;  Surgeon: Waynetta Sandy, MD;  Location: [place] [facility];  Service: Cardiovascular;  Laterality: Right;  . ARTERY REPAIR Right 02/17/2018   Procedure: EXPLORATION OF RIGHT BRACHIAL ARTERY;  Surgeon: Conrad Millvale, MD;  Location: [place];  Service: Vascular;  Laterality: Right;  . ARTERY REPAIR Right 02/17/2018   Procedure: BRACHIAL ARTERY EXPLORATION AND TRHOMBECTOMY;  Surgeon: Conrad [place], MD;  Location[facility];  Service: Vascular;  Laterality: Right;  . AV FISTULA PLACEMENT Left 05/09/2017   Procedure: INSERTION OF ARTERIOVENOUS (AV) GRAFT ARM (ARTEGRAFT);  Surgeon: Conrad Perdido, MD;  Location: [facility] OR;  Service: Vascular;  Laterality: Left;  . AV FISTULA PLACEMENT Right 02/17/2018   Procedure: INSERTION OF ARTERIOVENOUS (AV) GORE-TEX GRAFT ARM RIGHT UPPER ARM;  Surgeon: Conrad Cecil, MD;  Location: [place];  Service: Vascular;  Laterality: Right;  . AV FISTULA PLACEMENT Left 10/10/2019   Procedure: INSERTION OF LEFT ARTERIOVENOUS (AV) ARTEGRAFT GRAFT ARM;  Surgeon: Waynetta Sandy, MD;  Location: [place];  Service: Vascular;  Laterality: Left;  . AV FISTULA PLACEMENT Right 03/19/2020   Procedure: INSERTION OF ARTERIOVENOUS (AV) GORE-TEX GRAFT IN RIGHT ARM AND VIABAHN STENT IN RIGHT AXILLARY ARTERY;  Surgeon: Waynetta Sandy, MD;  Location: [place];  Service: Vascular;  Laterality: Right;  . BASCILIC VEIN TRANSPOSITION Left 08/31/2016   Procedure: BASILIC VEIN TRANSPOSITION FIRST STAGE;  Surgeon: Conrad Arboles, MD;  Location: [place];  Service: Vascular;  Laterality: Left;  . CENTRAL VENOUS CATHETER INSERTION Left 07/24/2018   Procedure: INSERTION CENTRAL LINE ADULT;  Surgeon: Conrad Pupukea, MD;  Location: [place];  Service: Vascular;  Laterality: Left;  . EYE SURGERY      secondary to diabetic retinopathy   . FOOT SURGERY Right    t"ook bone out- maybe hammer toe"  . INSERTION OF DIALYSIS CATHETER Left 07/24/2018   Procedure: INSERTION OF TUNNELED DIALYSIS CATHETER;  Surgeon: Conrad [place], MD;  Location: [place];  Service: Vascular;  Laterality: Left;  . IR FLUORO GUIDE CV LINE RIGHT  07/21/2018  . IR FLUORO GUIDE CV LINE RIGHT  06/01/2019  . IR US GUIDE VASC ACCESS RIGHT  07/21/2018  . IR US GUIDE VASC ACCESS RIGHT  06/01/2019  . LIGATION ARTERIOVENOUS GORTEX GRAFT Right 03/20/2020   Procedure: LIGATION ARM ARTERIOVENOUS GORTEX GRAFT With Patch Angioplasty, Thrombectomy;  Surgeon: Waynetta Sandy, MD;  Location: [place];  Service: Vascular;  Laterality: Right;  . LIGATION OF ARTERIOVENOUS  FISTULA Left 05/09/2017   Procedure: LIGATION OF ARTERIOVENOUS  FISTULA;  Surgeon: Conrad P[place], MD;  Location: [place];  Service: Vascular;  Laterality: Left;  . LOOP RECORDER INSERTION N/A 12/05/2017   Procedure: LOOP RECORDER INSERTION;  Surgeon: Evans Lance, MD;  Location: [place] [facility];  Service: Cardiovascular;  Laterality: N/A;  . MYOMECTOMY    . REMOVAL OF GRAFT Right 02/17/2018   Procedure: REMOVAL OF RIGHT UPPER ARM ARTERIOVENOUS GRAFT;  Surgeon: Conrad [place], MD;  Location: [place];  Service: Vascular;  Laterality: Right;  . REVISION OF ARTERIOVENOUS GORETEX GRAFT Left 07/24/2018   Procedure: REDO ARTERIOVENOUS GORETEX GRAFT;  Surgeon: Conrad Polonia, MD;  Location: [place];  Service: Vascular;  Laterality: Left;  . TEE WITHOUT CARDIOVERSION N/A 12/05/2017   Procedure: TRANSESOPHAGEAL ECHOCARDIOGRAM (TEE);  Surgeon: Sanda Klein, MD;  Location: [facility] ENDOSCOPY;  Service: Cardiovascular;  Laterality: N/A;  . UPPER EXTREMITY VENOGRAPHY Left 07/13/2018   Procedure: UPPER EXTREMITY VENOGRAPHY - Central & Left Arm;  Surgeon: Conrad [place], MD;  Location: [place] [facility];  Service: Cardiovascular;  Laterality: Left;  . UPPER EXTREMITY VENOGRAPHY Bilateral 09/10/2019     Procedure: UPPER EXTREMITY VENOGRAPHY;  Surgeon: Waynetta Sandy, MD;  Location: [place] [facility];  Service: Cardiovascular;  Laterality: Bilateral;  . UTERINE FIBROID SURGERY    . VITRECTOMY Bilateral      Social History   reports that she has never smoked. She has never used smokeless tobacco. She reports that she does not drink alcohol or use drugs.   Family History   Her family history includes Colon polyps in her mother; Diabetes in her brother, mother, sister, and sister; Heart murmur in her father and son; Hypertension in her father; Kidney failure in her sister; Retinal degeneration in her brother; Stroke in her paternal grandmother.   Allergies Allergies  Allergen Reactions  . Ibuprofen Other (See Comments)    CKD stage 3. Should avoid.  . Dilaudid [Hydromorphone Hcl] Itching and Other (See Comments)    Can take with Benadryl.  . Tramadol Itching     Home Medications  Prior to Admission medications   Medication Sig Start Date End Date Taking? Authorizing Provider  aspirin-acetaminophen-caffeine (EXCEDRIN MIGRAINE) [phone] MG tablet Take 1 tablet by mouth daily as needed for headache.   Yes [provider]  ELIQUIS 5 MG TABS tablet Take 5 mg by mouth 2 (two) times daily.  11/09/19  Yes [provider]  gabapentin (NEURONTIN) 300 MG capsule Take 1 capsule (300 mg total) by mouth daily as needed (for pain). Take after dialysis 04/16/20  Yes [name], MD  insulin aspart (NOVOLOG FLEXPEN) 100 UNIT/ML FlexPen Inject 10 Units into the skin 3 (three) times daily with meals. Sliding scale Patient taking differently: Inject 6 Units into the skin 3 (three) times daily with meals. Sliding scale 12/26/19  Yes Shamleffer, Melanie Crazier, MD  insulin degludec (TRESIBA FLEXTOUCH) 100 UNIT/ML SOPN FlexTouch Pen Inject 0.26 mLs (26 Units total) into the skin at bedtime. Patient taking differently: Inject 28 Units into the skin at bedtime.  12/26/19  Yes  Shamleffer, Melanie Crazier, MD  lubiprostone (AMITIZA) 24 MCG capsule Take 1 capsule (24 mcg total) by mouth 2 (two) times daily with a meal. Patient taking differently: Take 24 mcg by mouth daily as needed for constipation.  02/23/19  Yes Love, Ivan Anchors, PA-C  midodrine (PROAMATINE) 5 MG tablet TAKE 1 TABLET (5 MG TOTAL) BY MOUTH 2 (TWO) TIMES DAILY WITH A MEAL. Patient taking differently: Take 5  mg by mouth daily as needed (hypotension).  07/04/19  Yes Briscoe Deutscher, DO  Continuous Blood Gluc Sensor (FREESTYLE LIBRE 14 DAY SENSOR) MISC 1 patch by Does not apply route every 14 (fourteen) days. 12/26/19   Shamleffer, Melanie Crazier, MD  Insulin Pen Needle 29G X 5MM MISC 1 Device by Does not apply route 4 (four) times daily. 12/26/19   Shamleffer, Melanie Crazier, MD  Methoxy PEG-Epoetin Beta (MIRCERA IJ) Mircera 04/05/20 04/04/21  [provider]  montelukast (SINGULAIR) 10 MG tablet Take 1 tablet (10 mg total) by mouth at bedtime. 04/16/20   [name], MD  promethazine (PHENERGAN) 12.5 MG tablet Take 1 tablet (12.5 mg total) by mouth every 8 (eight) hours as needed. 04/16/20   [name], MD     Critical care time:   CRITICAL CARE Performed by: Johnsie Cancel   Total critical care time: 45 minutes  Critical care time was exclusive of separately billable procedures and treating other patients.  Critical care was necessary to treat or prevent imminent or life-threatening deterioration.  Critical care was time spent personally by me on the following activities: development of treatment plan with patient and/or surrogate as well as nursing, discussions with consultants, evaluation of patient's response to treatment, examination of patient, obtaining history from patient or surrogate, ordering and performing treatments and interventions, ordering and review of laboratory studies, ordering and review of radiographic studies, pulse oximetry and re-evaluation of patient's  condition.  Johnsie Cancel, NP-C [facility] Contact / Pager information can be found on Amion  05/05/2020, 5:41 PM

## 2020-05-05 NOTE — Progress Notes (Signed)
Chaplain responded to code in IR. The chaplain supported the family of the patient as they were alerted of the medical emergency. The chaplain is available if the family or the patient requires a follow-up.  Brion Aliment Chaplain Resident For questions concerning this note please contact me by pager [phone]

## 2020-05-05 NOTE — Progress Notes (Signed)
Patient received to 2H12 from interventional radiology. Following commands, VSS. Patient extubated per MD order. Levophed gtt weaned to off. Husband Juanda Crumble updated. COVID-19 swab pending.

## 2020-05-05 NOTE — Progress Notes (Signed)
Patient Status: [facility] - In-pt  Assessment and Plan: Patient in need of venous access for dialysis.  Mrs. Trenda Moots presents to [facility] IR today for exchange of her R tunneled dialysis catheter.  She has a history of several AV fistulas/grafts which have all clotted off.  She had a tunneled dialysis catheter placed by Vascular surgery last Monday, however it is currently non-functioning.   She has plans to get her RUA graft declotted vs. 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  [name] in need of dialysis catheter exchange today. Upon assessment for procedure today she as found to have severely elevated BP of 238/118.  She reported a very faint headache but denies blurry vision, lightheadeness, dizziness, and is otherwise in her usual state of health.  She reports that she has not needed her BP medication "in a long time" and in fact, review of her med list shows no current BP medications other than midodrine.  Per chart review her most recent BP at her PCP was 128/78.   Patient given 20mg  labetolol IV.  Obtain istat potassium to determine whether her dialysis catheter needs to be urgently exchanged vs. Undergo eval for elevated BP, then return for catheter exchange.  She reports she was last successfully dialyzed on Tuesday of last week.   Risks and benefits discussed with the patient including, but not limited to bleeding, infection, vascular injury, pneumothorax which may require chest tube placement, air embolism or even death  All of the patient's questions were answered, patient is agreeable to proceed. Consent signed and in chart.  ______________________________________________________________________   History of Present Illness: JOURDAN MALDONADO is a 49 y.o. female with past medical history of ESRD on dialysis, pulmonary embolus on Eliquis at home, hypertension but well-controlled without medication at home.  She presents to [facility] IR today  with clotted RUA graft.  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  She also has a R tunneled catheter placed by Vascular Surgery that was non-functional at dialysis on Thursday of last week.  Allergies and medications reviewed.   Review of Systems: A 12 point ROS discussed and pertinent positives are indicated in the HPI above.  All other systems are negative.  Review of Systems  Constitutional: Negative for fatigue and fever.  Respiratory: Negative for cough and shortness of breath.   Cardiovascular: Negative for chest pain.  Gastrointestinal: Negative for abdominal pain.  Musculoskeletal: Negative for back pain.  Neurological: Positive for headaches (very mild).  Psychiatric/Behavioral: Negative for behavioral problems and confusion.    Vital Signs: BP (!) 186/93   Pulse 85   Resp 16   Ht 5\' 8"  (1.727 m)   Wt 224 lb (101.6 kg)   SpO2 100%   BMI 34.06 kg/m   Physical Exam Vitals and nursing note reviewed.  Constitutional:      Appearance: Normal appearance.  HENT:     Mouth/Throat:     Mouth: Mucous membranes are moist.     Pharynx: Oropharynx is clear.  Neck:     Comments: R tunneled catheter in place Cardiovascular:     Rate and Rhythm: Normal rate and regular rhythm.  Pulmonary:     Effort: Pulmonary effort is normal.     Breath sounds: Normal breath sounds.  Abdominal:     General: Abdomen is flat.     Palpations: Abdomen is soft.  Skin:  General: Skin is warm and dry.  Neurological:     General: No focal deficit present.     Mental Status: [name] alert and oriented to person, place, and time. Mental status is at baseline.  Psychiatric:        Mood and Affect: Mood normal.        Behavior: Behavior normal.        Thought Content: Thought content normal.        Judgment: Judgment normal.      Imaging reviewed.   Labs:  COAGS: Recent Labs    05/31/19 0519 03/19/20 0600    INR 1.1 1.0  APTT 27  --     BMP: Recent Labs    06/02/19 0347 06/02/19 0347 06/02/19 1244 09/10/19 1220 10/13/19 0818 10/13/19 0818 03/03/20 0802 03/03/20 0802 03/03/20 9379 03/19/20 0714 03/19/20 1658 03/20/20 0358 03/20/20 1706  NA 136   < > 134*   < > 134*   < > 132*  --   --  136  --  134* 136  K 3.0*   < > 3.4*   < > 5.1   < > 7.0*   < > 4.6 4.6  --  5.6* 3.8  CL 101   < > 98   < > 97*   < > 100  --   --  100  --  98 97*  CO2 25  --  25  --  21*  --   --   --   --   --   --  21*  --   GLUCOSE 189*   < > 160*   < > 278*   < > 254*  --   --  274*  --  219* 117*  BUN 22*   < > 25*   < > 29*   < > 50*  --   --  33*  --  37* 14  CALCIUM 7.5*  --  7.6*  --  8.9  --   --   --   --   --   --  9.0  --   CREATININE 5.93*   < > 6.37*   < > 8.45*   < > 8.90*   < >  --  8.90* 8.80* 9.47* 5.20*  GFRNONAA 8*   < > 7*  --  5*  --   --   --   --   --  5* 4*  --   GFRAA 9*   < > 8*  --  6*  --   --   --   --   --  5* 5*  --    < > = values in this interval not displayed.       Electronically Signed: Docia Barrier, PA 05/05/2020, 3:27 PM   I spent a total of 15 minutes in face to face in clinical consultation, greater than 50% of which was counseling/coordinating care for venous access.

## 2020-05-05 NOTE — Anesthesia Procedure Notes (Deleted)
Procedure Name: Intubation Date/Time: 05/05/2020 4:43 PM Performed by: Moshe Salisbury, CRNA Pre-anesthesia Checklist: Patient identified, Emergency Drugs available, Suction available and Patient being monitored Patient Re-evaluated:Patient Re-evaluated prior to induction Oxygen Delivery Method: Ambu bag Preoxygenation: Pre-oxygenation with 100% oxygen Ventilation: Mask ventilation without difficulty and Oral airway inserted - appropriate to patient size Laryngoscope Size: Mac and 4 Grade View: Grade I Tube type: Oral Tube size: 7.5 mm Number of attempts: 1 Airway Equipment and Method: Stylet Placement Confirmation: ETT inserted through vocal cords under direct vision,  positive ETCO2,  breath sounds checked- equal and bilateral and CO2 detector Secured at: 22 cm Tube secured with: Tape Dental Injury: Teeth and Oropharynx as per pre-operative assessment

## 2020-05-05 NOTE — Progress Notes (Signed)
Patient presented to IR for RIJ  HD catheter exchange. Last dialysis session on 5.4.21. Patient hypertensive prior to procedure and given 10 mg of labetol X 2. Procedure performed without sedation successfully. Immediately post procedure pulses could not be indentified Patient found to be in PEA. CPR initiated immediately and rapid ressonse was called. 1/2 amp of Atropine given and ROSC achieved, Total time less than 5 minutes. Patient was intubated and moved to inpatient room. Please see nurse's notes for additional documentation

## 2020-05-05 NOTE — Progress Notes (Addendum)
Drue Novel, Pa called and informed of bp states she will come up and assess her

## 2020-05-05 NOTE — Consult Note (Signed)
Reason for Consult: Continuity of ESRD care Referring Physician: Ina Homes, MD (CCM)  HPI:  49 year old African-American woman with past medical history significant for type 2 diabetes mellitus, hypertension, dyslipidemia, gastroesophageal reflux disease, history of PE and prior history of CVA in 2018 who is on hemodialysis on a TTS schedule.  She last went to hemodialysis on 05/03/2020 and had about 44 minutes of dialysis out of her prescribed 4-hour treatment because of poor catheter flows.  She underwent elective exchange of her right IJ TDC by interventional radiology that earlier required treatment with labetalol for significant hypertension and postprocedure complicated by development of PEA arrest with ROSC in <5 minutes after rapid CPR/ACLS maneuvers.  She was transiently intubated for airway protection due to transient change in mental status and is consequently being extubated after transfer to the ICU.  Hemodialysis prescription: [place] [facility], TTS, 4 hours, 180 dialyzer, BFR 400/DFR 800, EDW 100 kg, 3K/2.25 calcium, right IJ TDC, Mircera 100 mcg IV every 2 weeks, heparin 7000 units bolus, Hectorol 2 mcg IV 3 times weekly  Past Medical History:  Diagnosis Date  . Antiplatelet or antithrombotic long-term use: Plavix 06/30/2017  . B12 deficiency 05/10/2014  . Blood transfusion without reported diagnosis   . Chronic constipation   . CVA (cerebral vascular accident) ([place]), nonhemorrhagic, inferior right cerebellum 12/03/2017   left side weakness in leg  . Cyst of left ovary 01/12/2018  . Diabetic neuropathy, painful ([place]), on low dose Gabapentin 07/13/2013  . DM (diabetes mellitus), type 2, uncontrolled, with renal complications ([place]) 78/29/5621  . DM2 (diabetes mellitus, type 2) ([place])   . Dysfunction of left eustachian tube, with pusatile tinnitus 11/24/2017  . Dyslipidemia associated with type 2 diabetes mellitus ([place]) 06/25/2016  . ESRD (end stage renal disease) on dialysis  [facility])    On hemodialysis in July 2019 via [facility] then switched to [facility] in Nov 2019.   [name] ESRD with anemia ([place])   . Fibromyalgia   . Gastroparesis due to DM   . GERD (gastroesophageal reflux disease)   . Headache   . Hypertension associated with diabetes ([place]) 02/19/2011  . IBS (irritable bowel syndrome)   . Left-sided weakness 12/10/2017   .  [name] Leiomyoma of uterus   . Pancreatitis   . PE (pulmonary thromboembolism) ([place]) 11/04/2019  . Pneumonia   . Proliferative diabetic retinopathy ([place]) 09/29/2015  . 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 Reactive depression 12/10/2017   .  [name] Right-sided low back pain without sciatica 12/08/2015  . Secondary hyperthyroidism 11/27/2013  . Steal syndrome dialysis vascular access ([place]) 02/17/2018  . Thromboembolism ([place]) 04/05/2018  . Upper airway cough syndrome, with recs to stay off ACE and take Pepcid q hs 11/15/2016    Past Surgical History:  Procedure Laterality Date  . A/V FISTULAGRAM Right 03/03/2020   Procedure: Venogram;  Surgeon: Waynetta Sandy, MD;  Location: [place] CV [facility];  Service: Cardiovascular;  Laterality: Right;  . ARTERY REPAIR Right 02/17/2018   Procedure: EXPLORATION OF RIGHT BRACHIAL ARTERY;  Surgeon: Conrad [place], MD;  Location: [place];  Service: Vascular;  Laterality: Right;  . ARTERY REPAIR Right 02/17/2018   Procedure: BRACHIAL ARTERY EXPLORATION AND TRHOMBECTOMY;  Surgeon: Conrad Normangee, MD;  Location: [facility] OR;  Service: Vascular;  Laterality: Right;  . AV FISTULA PLACEMENT Left 05/09/2017   Procedure: INSERTION OF ARTERIOVENOUS (AV) GRAFT ARM (ARTEGRAFT);  Surgeon: Conrad [place], MD;  Location: [place];  Service: Vascular;  Laterality:  Left;  . AV FISTULA PLACEMENT Right 02/17/2018   Procedure: INSERTION OF ARTERIOVENOUS (AV) GORE-TEX GRAFT ARM RIGHT UPPER ARM;  Surgeon: Conrad Loami, MD;  Location: [place];  Service: Vascular;  Laterality: Right;  . AV FISTULA PLACEMENT Left 10/10/2019   Procedure: INSERTION OF  LEFT ARTERIOVENOUS (AV) ARTEGRAFT GRAFT ARM;  Surgeon: Waynetta Sandy, MD;  Location: [place];  Service: Vascular;  Laterality: Left;  . AV FISTULA PLACEMENT Right 03/19/2020   Procedure: INSERTION OF ARTERIOVENOUS (AV) GORE-TEX GRAFT IN RIGHT ARM AND VIABAHN STENT IN RIGHT AXILLARY ARTERY;  Surgeon: Waynetta Sandy, MD;  Location: [place];  Service: Vascular;  Laterality: Right;  . BASCILIC VEIN TRANSPOSITION Left 08/31/2016   Procedure: BASILIC VEIN TRANSPOSITION FIRST STAGE;  Surgeon: Conrad [place], MD;  Location: [place];  Service: Vascular;  Laterality: Left;  . CENTRAL VENOUS CATHETER INSERTION Left 07/24/2018   Procedure: INSERTION CENTRAL LINE ADULT;  Surgeon: Conrad [place], MD;  Location: [place];  Service: Vascular;  Laterality: Left;  . EYE SURGERY     secondary to diabetic retinopathy   . FOOT SURGERY Right    t"ook bone out- maybe hammer toe"  . INSERTION OF DIALYSIS CATHETER Left 07/24/2018   Procedure: INSERTION OF TUNNELED DIALYSIS CATHETER;  Surgeon: Conrad Klagetoh, MD;  Location: [place];  Service: Vascular;  Laterality: Left;  . IR FLUORO GUIDE CV LINE RIGHT  07/21/2018  . IR FLUORO GUIDE CV LINE RIGHT  06/01/2019  . IR US GUIDE VASC ACCESS RIGHT  07/21/2018  . IR US GUIDE VASC ACCESS RIGHT  06/01/2019  . LIGATION ARTERIOVENOUS GORTEX GRAFT Right 03/20/2020   Procedure: LIGATION ARM ARTERIOVENOUS GORTEX GRAFT With Patch Angioplasty, Thrombectomy;  Surgeon: Waynetta Sandy, MD;  Location: [place];  Service: Vascular;  Laterality: Right;  . LIGATION OF ARTERIOVENOUS  FISTULA Left 05/09/2017   Procedure: LIGATION OF ARTERIOVENOUS  FISTULA;  Surgeon: Conrad Elysian, MD;  Location: [place];  Service: Vascular;  Laterality: Left;  . LOOP RECORDER INSERTION N/A 12/05/2017   Procedure: LOOP RECORDER INSERTION;  Surgeon: Evans Lance, MD;  Location: [place] [facility];  Service: Cardiovascular;  Laterality: N/A;  . MYOMECTOMY    . REMOVAL OF GRAFT Right 02/17/2018   Procedure:  REMOVAL OF RIGHT UPPER ARM ARTERIOVENOUS GRAFT;  Surgeon: Conrad Sultan, MD;  Location: [place];  Service: Vascular;  Laterality: Right;  . REVISION OF ARTERIOVENOUS GORETEX GRAFT Left 07/24/2018   Procedure: REDO ARTERIOVENOUS GORETEX GRAFT;  Surgeon: Conrad [place]ville, MD;  Location: [place];  Service: Vascular;  Laterality: Left;  . TEE WITHOUT CARDIOVERSION N/A 12/05/2017   Procedure: TRANSESOPHAGEAL ECHOCARDIOGRAM (TEE);  Surgeon: Sanda Klein, MD;  Location: [facility];  Service: Cardiovascular;  Laterality: N/A;  . UPPER EXTREMITY VENOGRAPHY Left 07/13/2018   Procedure: UPPER EXTREMITY VENOGRAPHY - Central & Left Arm;  Surgeon: Conrad , MD;  Location: [place] [facility];  Service: Cardiovascular;  Laterality: Left;  . UPPER EXTREMITY VENOGRAPHY Bilateral 09/10/2019   Procedure: UPPER EXTREMITY VENOGRAPHY;  Surgeon: Waynetta Sandy, MD;  Location: [place] [facility];  Service: Cardiovascular;  Laterality: Bilateral;  . UTERINE FIBROID SURGERY    . VITRECTOMY Bilateral     Family History  Problem Relation Age of Onset  . Colon polyps Mother   . Diabetes Mother   . Heart murmur Father        history essentially unknown  . Hypertension Father   . Diabetes Sister   .  Kidney failure Sister        kidney transplant  . Diabetes Brother   . Retinal degeneration Brother   . Heart murmur Son   . Stroke Paternal Grandmother   . Diabetes Sister     Social History:  reports that she has never smoked. She has never used smokeless tobacco. She reports that she does not drink alcohol or use drugs.  Allergies:  Allergies  Allergen Reactions  . Ibuprofen Other (See Comments)    CKD stage 3. Should avoid.  . Dilaudid [Hydromorphone Hcl] Itching and Other (See Comments)    Can take with Benadryl.  . Tramadol Itching    Medications:  Scheduled: . apixaban  5 mg Oral BID  . chlorhexidine      . Chlorhexidine Gluconate Cloth  6 each Topical Daily  . Chlorhexidine Gluconate  Cloth  6 each Topical Daily  . fentaNYL      . heparin sodium (porcine)      . insulin aspart  0-9 Units Subcutaneous Q4H  . lidocaine      . midazolam      . midodrine  5 mg Oral BID WC  . sodium chloride flush  10-40 mL Intracatheter Q12H   BMP Latest Ref Rng & Units 05/05/2020 03/20/2020 03/20/2020  Glucose 70 - 99 mg/dL 188(H) 117(H) 219(H)  BUN 6 - 20 mg/dL 41(H) 14 37(H)  Creatinine 0.44 - 1.00 mg/dL 10.20(H) 5.20(H) 9.47(H)  BUN/Creat Ratio - - - -  Sodium 135 - 145 mmol/L 138 136 134(L)  Potassium 3.5 - 5.1 mmol/L 4.6 3.8 5.6(H)  Chloride 98 - 111 mmol/L 105 97(L) 98  CO2 22 - 32 mmol/L - - 21(L)  Calcium 8.9 - 10.3 mg/dL - - 9.0   CBC Latest Ref Rng & Units 05/05/2020 05/05/2020 03/20/2020  WBC 4.0 - 10.5 K/uL 7.6 - -  Hemoglobin 12.0 - 15.0 g/dL 8.9(L) 10.9(L) 11.6(L)  Hematocrit 36.0 - 46.0 % 30.4(L) 32.0(L) 34.0(L)  Platelets 150 - 400 K/uL 236 - -     DG Chest Port 1 View  Result Date: 05/05/2020 CLINICAL DATA:  Endotracheal tube placement EXAM: PORTABLE CHEST 1 VIEW COMPARISON:  10/13/2019 FINDINGS: The endotracheal tube terminates above the carina by approximately 2 cm. There is a well-positioned tunneled dialysis catheter on the right. There is a cardiac device projecting over the patient's right ventricle which may represent a leadless pacemaker. The lung volumes are somewhat low. There is no pneumothorax. No focal infiltrate. No significant pleural effusion. IMPRESSION: 1. Lines and tubes as above. 2. No pneumothorax. No focal infiltrate. No significant pleural effusion. Electronically Signed   By: Constance Holster M.D.   On: 05/05/2020 17:50    Review of Systems  Unable to perform ROS: Mental status change (Status post cardiac arrest, just extubated)   Blood pressure (!) 196/97, pulse 85, resp. rate 16, height 5\' 8"  (1.727 m), weight 101.6 kg, SpO2 100 %. Physical Exam  Nursing note and vitals reviewed. Constitutional: She appears well-developed and  well-nourished.  Lethargic, awakens easily  HENT:  Head: Normocephalic and atraumatic.  Mouth/Throat: Oropharynx is clear and moist.  Eyes: Pupils are equal, round, and reactive to light. Conjunctivae are normal.  Neck: No JVD present.  Cardiovascular: Normal rate, regular rhythm and normal heart sounds.  Respiratory: Breath sounds normal. She has no wheezes. She has no rales.  Right IJ TDC  GI: Soft. Bowel sounds are normal. There is no abdominal tenderness. There is no rebound and no guarding.  Musculoskeletal:        General: No edema.     Cervical back: Normal range of motion and neck supple.  Neurological:  Somnolent/lethargic  Skin: Skin is warm and dry. No rash noted.    Assessment/Plan: 1.  Status post PEA arrest: Transiently intubated for respiratory insufficiency.  Now extubated and appears to be oxygenating well.  Plans noted to trend troponin and continuous telemetry with echocardiogram. 2.  End-stage renal disease on hemodialysis: We will undertake hemodialysis tomorrow; with the decision for here down in the ICU versus upstairs in the inpatient dialysis unit based on overnight events. 3.  Hypertension: Significantly elevated blood pressures noted, resume oral antihypertensive medications when safe enough to swallow. 4.  Anemia of chronic kidney disease: We will hold off on ESA administration while in the hospital until blood pressures are better controlled and date of last dose verified. 5.  Secondary hyperparathyroidism: Resume renal diet when able to swallow and resume Hectorol for PTH suppression.  Check phosphorus with labs tomorrow.  Rett Stehlik K. 05/05/2020, 6:18 PM

## 2020-05-05 NOTE — Significant Event (Signed)
Rapid Response Event Note  Code Blue: 8757  Responded to Code Blue in Interventional Radiology.  Upon my arrival the patient had a weak pulse and was bradycardic HR 40s.  0.5mg  Atropine given IV.  HR 120s then stabilized SR 80-90s.  BP 56/36,  67/52.  Levophed gtt started at 73mcg/min via HD cath.  Patient is breathing on her own but has not woken up.  O2 sats 100%.  CRNA intubated patient without difficulty.   BP 85/59,  91/63, 105/72 SR 85 Patient placed on the vent.   She awoke and was coughing and gaging, reaching for the ET tube.  2mg  versed and 50 mcg Fentanyl given per orders.    Code Blue Event End 9728  Transported to 2H12 with zoll and O2 via vent.  Staff at bedside to receive patient.    Raliegh Ip

## 2020-05-05 NOTE — Progress Notes (Signed)
VAST RN X2 to Code Blue in Interventional radiology. Pt with one PIV and [place]. MD at bedside stated [facility] could be used for code meds, fluids, and lab draws. No further access needed at this time.

## 2020-05-05 NOTE — Procedures (Signed)
Interventional Radiology Procedure Note  Procedure: RT IJ HD CATH EXCHG  Complications:  POST PROCEDURE CODE   Estimated Blood Loss: MIN  Findings: TIP SVCRA

## 2020-05-05 NOTE — Procedures (Signed)
Extubation Procedure Note  Patient Details:   Name: Heather [name] DOB: Jul 01, 1971 MRN: [id number]   Airway Documentation:    Vent end date: 05/05/20 Vent end time: 1739   Evaluation  O2 sats: stable throughout Complications: No apparent complications Patient did tolerate procedure well.     Yes, pt could speak post extubation.  Pt extubated to 2 l/m [place] without adverse reactions.  Earney Navy 05/05/2020, 5:40 PM

## 2020-05-06 ENCOUNTER — Inpatient Hospital Stay (HOSPITAL_COMMUNITY): Payer: Managed Care, Other (non HMO)

## 2020-05-06 DIAGNOSIS — I468 Cardiac arrest due to other underlying condition: Secondary | ICD-10-CM

## 2020-05-06 LAB — RENAL FUNCTION PANEL
Albumin: 2.7 g/dL — ABNORMAL LOW (ref 3.5–5.0)
Anion gap: 10 (ref 5–15)
BUN: 53 mg/dL — ABNORMAL HIGH (ref 6–20)
CO2: 17 mmol/L — ABNORMAL LOW (ref 22–32)
Calcium: 7.8 mg/dL — ABNORMAL LOW (ref 8.9–10.3)
Chloride: 106 mmol/L (ref 98–111)
Creatinine, Ser: 11.3 mg/dL — ABNORMAL HIGH (ref 0.44–1.00)
GFR calc Af Amer: 4 mL/min — ABNORMAL LOW (ref >60)
GFR calc non Af Amer: 3 mL/min — ABNORMAL LOW (ref >60)
Glucose, Bld: 161 mg/dL — ABNORMAL HIGH (ref 70–99)
Phosphorus: 5.5 mg/dL — ABNORMAL HIGH (ref 2.5–4.6)
Potassium: 4.6 mmol/L (ref 3.5–5.1)
Sodium: 133 mmol/L — ABNORMAL LOW (ref 135–145)

## 2020-05-06 LAB — GLUCOSE, CAPILLARY
Glucose-Capillary: 173 mg/dL — ABNORMAL HIGH (ref 70–99)
Glucose-Capillary: 159 mg/dL — ABNORMAL HIGH (ref 70–99)
Glucose-Capillary: 137 mg/dL — ABNORMAL HIGH (ref 70–99)
Glucose-Capillary: 92 mg/dL (ref 70–99)
Glucose-Capillary: 177 mg/dL — ABNORMAL HIGH (ref 70–99)
Glucose-Capillary: 123 mg/dL — ABNORMAL HIGH (ref 70–99)

## 2020-05-06 LAB — CBC
HCT: 27.5 % — ABNORMAL LOW (ref 36.0–46.0)
HCT: 26.4 % — ABNORMAL LOW (ref 36.0–46.0)
Hemoglobin: 8.3 g/dL — ABNORMAL LOW (ref 12.0–15.0)
Hemoglobin: 8 g/dL — ABNORMAL LOW (ref 12.0–15.0)
MCH: 30.2 pg (ref 26.0–34.0)
MCH: 30.3 pg (ref 26.0–34.0)
MCHC: 30.2 g/dL (ref 30.0–36.0)
MCHC: 30.3 g/dL (ref 30.0–36.0)
MCV: 100 fL (ref 80.0–100.0)
MCV: 100 fL (ref 80.0–100.0)
Platelets: 158 10*3/uL (ref 150–400)
Platelets: 160 10*3/uL (ref 150–400)
RBC: 2.75 MIL/uL — ABNORMAL LOW (ref 3.87–5.11)
RBC: 2.64 MIL/uL — ABNORMAL LOW (ref 3.87–5.11)
RDW: 14.9 % (ref 11.5–15.5)
RDW: 15 % (ref 11.5–15.5)
WBC: 18.7 10*3/uL — ABNORMAL HIGH (ref 4.0–10.5)
WBC: 10.6 10*3/uL — ABNORMAL HIGH (ref 4.0–10.5)
nRBC: 0 % (ref 0.0–0.2)
nRBC: 0 % (ref 0.0–0.2)

## 2020-05-06 LAB — ECHOCARDIOGRAM COMPLETE
Height: 68 in
Weight: 3584 oz

## 2020-05-06 LAB — MAGNESIUM: Magnesium: 1.8 mg/dL (ref 1.7–2.4)

## 2020-05-06 LAB — PHOSPHORUS: Phosphorus: 5.2 mg/dL — ABNORMAL HIGH (ref 2.5–4.6)

## 2020-05-06 MED ORDER — LIDOCAINE-PRILOCAINE 2.5-2.5 % EX CREA: 1.0000 "application " | TOPICAL_CREAM | CUTANEOUS | Status: DC | PRN

## 2020-05-06 MED ORDER — SODIUM CHLORIDE 0.9 % IV SOLN: 100.0000 mL | INTRAVENOUS | Status: DC | PRN

## 2020-05-06 MED ORDER — HEPARIN SODIUM (PORCINE) 1000 UNIT/ML DIALYSIS: 7000.0000 [IU] | INTRAMUSCULAR | Status: DC | PRN

## 2020-05-06 MED ORDER — PENTAFLUOROPROP-TETRAFLUOROETH EX AERO: 1.0000 "application " | INHALATION_SPRAY | CUTANEOUS | Status: DC | PRN

## 2020-05-06 MED ORDER — ALTEPLASE 2 MG IJ SOLR: 2.0000 mg | Freq: Once | INTRAMUSCULAR | Status: AC | PRN

## 2020-05-06 MED ORDER — LIDOCAINE HCL (PF) 1 % IJ SOLN: 5.0000 mL | INTRAMUSCULAR | Status: DC | PRN

## 2020-05-06 MED ORDER — ALTEPLASE 2 MG IJ SOLR: 2.0000 mg | Freq: Once | INTRAMUSCULAR | Status: DC | PRN

## 2020-05-06 MED ORDER — ACETAMINOPHEN 325 MG PO TABS
650.0000 mg | ORAL_TABLET | Freq: Four times a day (QID) | ORAL | Status: DC | PRN
  Administered 2020-05-06: 650 mg via ORAL

## 2020-05-06 MED ORDER — HEPARIN SODIUM (PORCINE) 1000 UNIT/ML DIALYSIS: 1000.0000 [IU] | INTRAMUSCULAR | Status: DC | PRN

## 2020-05-06 MED FILL — Medication: Qty: 1 | Status: AC

## 2020-05-06 NOTE — Progress Notes (Signed)
Referring Physician(s): Otelia Santee W  Supervising Physician: Corrie Mckusick  Patient Status:  [facility] - In-pt  Chief Complaint: S/P PEA   Subjective: 49 y.o, female inpatient. History of ESRD presented to IR for HD catheter exchange. Prior to procedure patient was hypertensive and 10 mg of labetolol was given. Procedure performed successful but patient lost pulses and was found to be in PEA immediately following procedure. CPR was immediately initiated and ROSC was accomplished < 5 minutes. Patient states that she has not had dialysis as of yet but is scheduled for it later today. Patient alert and laying in bed, calm and comfortable. Denies any fevers, headache, SOB, cough, abdominal pain, nausea, vomiting or bleeding. Patient reports left sided chest pain.   Allergies: Ibuprofen, Dilaudid [hydromorphone hcl], and Tramadol  Medications: Prior to Admission medications   Medication Sig Start Date End Date Taking? Authorizing Provider  aspirin-acetaminophen-caffeine (EXCEDRIN MIGRAINE) [phone] MG tablet Take 1 tablet by mouth daily as needed for headache.   Yes [provider]  Continuous Blood Gluc Sensor (FREESTYLE LIBRE 14 DAY SENSOR) MISC 1 patch by Does not apply route every 14 (fourteen) days. 12/26/19  Yes Shamleffer, Melanie Crazier, MD  ELIQUIS 5 MG TABS tablet Take 5 mg by mouth 2 (two) times daily.  11/09/19  Yes [provider]  gabapentin (NEURONTIN) 300 MG capsule Take 1 capsule (300 mg total) by mouth daily as needed (for pain). Take after dialysis 04/16/20  Yes [name], MD  insulin aspart (NOVOLOG FLEXPEN) 100 UNIT/ML FlexPen Inject 10 Units into the skin 3 (three) times daily with meals. Sliding scale Patient taking differently: Inject 6 Units into the skin 3 (three) times daily with meals. Sliding scale 12/26/19  Yes Shamleffer, Melanie Crazier, MD  insulin degludec (TRESIBA FLEXTOUCH) 100 UNIT/ML SOPN FlexTouch Pen Inject 0.26 mLs (26 Units  total) into the skin at bedtime. Patient taking differently: Inject 28 Units into the skin at bedtime.  12/26/19  Yes Shamleffer, Melanie Crazier, MD  Insulin Pen Needle 29G X 5MM MISC 1 Device by Does not apply route 4 (four) times daily. 12/26/19  Yes Shamleffer, Melanie Crazier, MD  lubiprostone (AMITIZA) 24 MCG capsule Take 1 capsule (24 mcg total) by mouth 2 (two) times daily with a meal. Patient taking differently: Take 24 mcg by mouth daily.  02/23/19  Yes Love, Ivan Anchors, PA-C  midodrine (PROAMATINE) 5 MG tablet TAKE 1 TABLET (5 MG TOTAL) BY MOUTH 2 (TWO) TIMES DAILY WITH A MEAL. Patient taking differently: Take 5 mg by mouth daily as needed (hypotension).  07/04/19  Yes Briscoe Deutscher, DO  montelukast (SINGULAIR) 10 MG tablet Take 1 tablet (10 mg total) by mouth at bedtime. Patient taking differently: Take 10 mg by mouth at bedtime as needed (allergies).  04/16/20  Yes [name], MD  promethazine (PHENERGAN) 12.5 MG tablet Take 1 tablet (12.5 mg total) by mouth every 8 (eight) hours as needed. Patient taking differently: Take 12.5 mg by mouth every 8 (eight) hours as needed for nausea or vomiting.  04/16/20  Yes [name], MD  B Complex-C-Zn-Folic Acid (DIALYVITE [id number] 15) 0.8 MG TABS Take 1 tablet by mouth daily. 04/28/20   [provider]     Vital Signs: BP 129/64   Pulse 87   Temp 98.1 F (36.7 C) (Oral)   Resp 17   Ht 5\' 8"  (1.727 m)   Wt 224 lb (101.6 kg)   SpO2 95%   BMI 34.06 kg/m  Physical Exam  Imaging: IR Fluoro Guide CV Line Right  Result Date: 05/06/2020 INDICATION: End-stage renal disease, poor functioning malpositioned HD catheter EXAM: FLUOROSCOPIC HD CATHETER EXCHANGE AND REPOSITION MEDICATIONS: ANCEF 2 G; The antibiotic was administered within an appropriate time interval prior to skin puncture. ANESTHESIA/SEDATION: Moderate Sedation Time: NONE. The patient's level of consciousness and vital signs were monitored continuously by radiology  nursing throughout the procedure under my direct supervision. FLUOROSCOPY TIME:  Fluoroscopy Time: 0 minutes 26 seconds (3.8 mGy). COMPLICATIONS: None immediate. PROCEDURE: Informed written consent was obtained from the patient after a thorough discussion of the procedural risks, benefits and alternatives. All questions were addressed. Maximal Sterile Barrier Technique was utilized including caps, mask, sterile gowns, sterile gloves, sterile drape, hand hygiene and skin antiseptic. A timeout was performed prior to the initiation of the procedure. Initial fluoroscopy of the chest demonstrates a 23 cm catheter with the tip extending into the hepatic IVC. Catheter was removed under sterile conditions and local anesthesia. A new 19 cm tip to cuff palindrome catheter was advanced with the tip position at the SVC RA junction. Catheter secured with a Prolene suture. Blood aspirated easily followed by saline and heparin flushes. Appropriate volume and strength of heparin instilled in all lumens followed by external caps and a sterile dressing. Patient tolerated the procedure well. Shortly after the procedure, the patient became unresponsive without a palpable pulse. This was recognized immediately. Code blue was called. CPR was initiated immediately. Patient was found to be in PEA. After approximately 2 minutes of CPR there was return of a palpable pulse. Patient was in sinus bradycardia with a heart rate in the low 50s. 0.5 mg atropine was administered with ROSC achieved. Initial blood pressure was in the 89-38 range for systolic pressure. Patient was also intubated by anesthesia. Total code intervention less than 5 minutes. Ultimately she was transferred to the ICU, was following commands, and extubated approximately 50 minutes later. IMPRESSION: Fluoroscopic exchange and reposition of the tunneled right IJ dialysis catheter. Tip SVC RA junction. Successful resuscitation as above. Electronically Signed   By: Jerilynn Mages.  Shick  M.D.   On: 05/06/2020 09:14   DG Chest Port 1 View  Result Date: 05/05/2020 CLINICAL DATA:  Endotracheal tube placement EXAM: PORTABLE CHEST 1 VIEW COMPARISON:  10/13/2019 FINDINGS: The endotracheal tube terminates above the carina by approximately 2 cm. There is a well-positioned tunneled dialysis catheter on the right. There is a cardiac device projecting over the patient's right ventricle which may represent a leadless pacemaker. The lung volumes are somewhat low. There is no pneumothorax. No focal infiltrate. No significant pleural effusion. IMPRESSION: 1. Lines and tubes as above. 2. No pneumothorax. No focal infiltrate. No significant pleural effusion. Electronically Signed   By: Constance Holster M.D.   On: 05/05/2020 17:50    Labs:  CBC: Recent Labs    03/19/20 1658 03/19/20 1658 03/20/20 0358 03/20/20 0358 03/20/20 1706 05/05/20 1557 05/05/20 1722 05/06/20 0220  WBC 12.5*  --  9.8  --   --   --  7.6 18.7*  HGB 11.7*   < > 11.8*   < > 11.6* 10.9* 8.9* 8.3*  HCT 38.7   < > 38.8   < > 34.0* 32.0* 30.4* 27.5*  PLT 164  --  134*  --   --   --  236 158   < > = values in this interval not displayed.    COAGS: Recent Labs    05/31/19 0519 03/19/20 0600  INR 1.1 1.0  APTT 27  --     BMP: Recent Labs    06/02/19 1244 09/10/19 1220 10/13/19 0818 03/03/20 0802 03/19/20 0714 03/19/20 1658 03/20/20 0358 03/20/20 1706 05/05/20 1557 05/05/20 1722  NA 134*   < > 134*   < >   < >  --  134* 136 138 136  K 3.4*   < > 5.1   < >   < >  --  5.6* 3.8 4.6 4.4  CL 98   < > 97*   < >   < >  --  98 97* 105 105  CO2 25  --  21*  --   --   --  21*  --   --  18*  GLUCOSE 160*   < > 278*   < >   < >  --  219* 117* 188* 269*  BUN 25*   < > 29*   < >   < >  --  37* 14 41* 43*  CALCIUM 7.6*  --  8.9  --   --   --  9.0  --   --  8.0*  CREATININE 6.37*   < > 8.45*   < >   < > 8.80* 9.47* 5.20* 10.20* 10.09*  GFRNONAA 7*  --  5*  --   --  5* 4*  --   --  4*  GFRAA 8*  --  6*  --   --   5* 5*  --   --  5*   < > = values in this interval not displayed.    LIVER FUNCTION TESTS: Recent Labs    05/30/19 1816 05/30/19 1816 06/02/19 1244 10/13/19 0818 03/20/20 0358 05/05/20 1722  BILITOT 0.8  --   --   --  QUANTITY NOT SUFFICIENT, UNABLE TO PERFORM TEST 0.4  AST 14*  --   --   --  19 75*  ALT 14  --   --   --  22 79*  ALKPHOS 140*  --   --   --  85 93  PROT 7.7  --   --   --  7.2 5.6*  ALBUMIN 2.6*   < > 2.0* 3.0* 2.9* 2.3*   < > = values in this interval not displayed.    Assessment and Plan:  49 y.o, female inpatient. History of ESRD presented to IR for HD catheter exchange. Prior to procedure patient was hypertensive and 10 mg of labetolol was given. Procedure performed successful but patient lost pulses and was found to be in PEA immediately following procedure. CPR was immediately initiated and ROSC was accomplished < 5 minutes. Patient states that she has not had dialysis as of yet but is scheduled for it later today.   Pertinent Imaging none  Pertinent IR History 5.10.21 - RIJ HD catheter exchange  Pertinent Allergies Ibuprofen Tramadol Dilaudid  WBC is 18.7 All labs and medications are within acceptable parameters.    IR will continue to follow along - plans per [facility], per chart ok for d/c to [facility] from surgical perspective   Electronically Signed: Avel Peace, NP 05/06/2020, 12:30 PM   I spent a total of 15 Minutes at the the patient's bedside AND on the patient's hospital floor or unit, greater than 50% of which was counseling/coordinating care for HD catheter exchange

## 2020-05-06 NOTE — Discharge Summary (Signed)
Physician Discharge Summary  Patient ID: Heather [name] MRN: [id number] DOB/AGE: 04/12/1971 49 y.o.  Admit date: 05/05/2020 Discharge date: 05/07/2020    Discharge Diagnoses:  PEA Arrest  Acute Respiratory Insufficiency  ESRD on HD  DM II  Diabetic Neuropathy  Gastroparesis Hx PE on Eliquis                                                                      DISCHARGE PLAN BY DIAGNOSIS      PEA Arrest  Acute Respiratory Insufficiency   Discharge Plan: -No acute follow up at time of discharge. ECHO without change. EKG without ischemic changes, high sensitivity troponin negative. Question if related to labetalol dosing for HTN in the setting of ESRD? -follow up with primary care provider   ESRD on HD   Discharge Plan: -follow up with Nephrology as previously arranged   DM II  Diabetic Neuropathy  Gastroparesis  Discharge Plan: -follow up with primary care for glucose control  -follow up with wound clinic for foot care / wound prevention measures as previously arranged -consider diabetic shoes   Hx PE on Eliquis                                 Discharge Plan: -continue home eliquis                      DISCHARGE SUMMARY    49 year old female with an extensive past medical history including but not limited to prior CVA in 2018, type 2 diabetes, dyslipidemia, end-stage renal disease on hemodialysis, GERD, hypertension who presented for elective exchange of right tunneled HD catheter by IR.  PCCM was called to interventional radiology for a CODE BLUE.  It was reported patient was significantly hypertensive during case at which time she received labetalol after which patient was seen in witnessed PEA arrest.  ACLS protocol initiated with quick, less than 5-minute, ROSC was achieved.  Per rapid response patient was seen bradycardic in the 40s post ROSC at which time she received atropine.  Due to ineffective mentation patient was electively intubated in IR.  Upon  arrival to IR suite patient was slowly waking up with intentional attempts to remove ET tube.  The patient was subsequently extubated post ICU admission. She tolerated intermittent hemodialysis on 5/12 without difficulty. She was medically cleared 5/12 for discharge with plans as above.              SIGNIFICANT DIAGNOSTIC STUDIES  ECHO PTA 02/08/2019 >> The left ventricle has normal systolic function with an ejection fraction of 60-65%. The cavity size was normal. There is mildly increased left ventricular wall thickness. Left ventricular diastolic Doppler parameters are consistent with impaired relaxation Elevated mean left atrial pressure. The right ventricle has normal systolic function. The cavity was normal. There is no increase in right ventricular wall thickness. Small pericardial effusion. The mitral valve is normal in structure. The tricuspid valve is normal in structure. The aortic valve is tricuspid.  The pulmonic valve was normal in structure.   ECHO 5/11 >> LVEF 46-27%, grade I diastolic dysfunction, elevated left atrial pressure, RV systolic function normal   MICRO DATA  COVID 5/10 >> negative   CONSULTS Nephrology  PCCM  TUBES / LINES ETT 5/10 >> 5/10    Discharge Exam: General: adult female lying in bed in NAD HEENT: MM pink/moist, anicteric, good dentition  Neuro: AAOx4, speech clear, MAE  CV: s1s2 rrr, no m/r/g PULM: non-labored on RA, lungs bilaterally clear  GI: soft, bsx4 active  Extremities: warm/dry, no edema  Skin: no rashes. Left lateral foot with callous, small circular lesion (~ size of pencil eraser) on plantar great toe on left foot - dry, no drainage   Vitals:   05/07/20 0621 05/07/20 0630 05/07/20 0700 05/07/20 0724  BP: (!) 166/85 (!) 167/84 (!) 197/90 (!) 196/92  Pulse: 82 82 83 87  Resp: '13 12 15 17  ' Temp:    98 F (36.7 C)  TempSrc:    Oral  SpO2: 97% 96% 98% 97%  Weight:      Height:         Discharge Labs  BMET Recent Labs  Lab  05/05/20 1557 05/05/20 1557 05/05/20 1722 05/06/20 0220 05/06/20 1856  NA 138  --  136  --  133*  K 4.6   < > 4.4  --  4.6  CL 105  --  105  --  106  CO2  --   --  18*  --  17*  GLUCOSE 188*  --  269*  --  161*  BUN 41*  --  43*  --  53*  CREATININE 10.20*  --  10.09*  --  11.30*  CALCIUM  --   --  8.0*  --  7.8*  MG  --   --  1.8 1.8  --   PHOS  --   --  4.8* 5.2* 5.5*   < > = values in this interval not displayed.    CBC Recent Labs  Lab 05/05/20 1722 05/06/20 0220 05/06/20 1856  HGB 8.9* 8.3* 8.0*  HCT 30.4* 27.5* 26.4*  WBC 7.6 18.7* 10.6*  PLT 236 158 160     Allergies as of 05/07/2020      Reactions   Ibuprofen Other (See Comments)   CKD stage 3. Should avoid.   Dilaudid [hydromorphone Hcl] Itching, Other (See Comments)   Can take with Benadryl.   Tramadol Itching      Medication List    TAKE these medications   aspirin-acetaminophen-caffeine 250-250-65 MG tablet Commonly known as: EXCEDRIN MIGRAINE Take 1 tablet by mouth daily as needed for headache.   Dialyvite 800-Zinc 15 0.8 MG Tabs Take 1 tablet by mouth daily.   Eliquis 5 MG Tabs tablet Generic drug: apixaban Take 5 mg by mouth 2 (two) times daily.   FreeStyle Libre 14 Day Sensor Misc 1 patch by Does not apply route every 14 (fourteen) days.   gabapentin 300 MG capsule Commonly known as: NEURONTIN Take 1 capsule (300 mg total) by mouth daily as needed (for pain). Take after dialysis   Insulin Pen Needle 29G X 5MM Misc 1 Device by Does not apply route 4 (four) times daily.   lubiprostone 24 MCG capsule Commonly known as: Amitiza Take 1 capsule (24 mcg total) by mouth 2 (two) times daily with a meal. What changed: when to take this   midodrine 5 MG tablet Commonly known as: PROAMATINE TAKE 1 TABLET (5 MG TOTAL) BY MOUTH 2 (TWO) TIMES DAILY WITH A MEAL. What changed:   when to take this  reasons to take this   montelukast 10 MG tablet  Commonly known as: SINGULAIR Take 1 tablet  (10 mg total) by mouth at bedtime. What changed:   when to take this  reasons to take this   NovoLOG FlexPen 100 UNIT/ML FlexPen Generic drug: insulin aspart Inject 10 Units into the skin 3 (three) times daily with meals. Sliding scale What changed: how much to take   promethazine 12.5 MG tablet Commonly known as: PHENERGAN Take 1 tablet (12.5 mg total) by mouth every 8 (eight) hours as needed. What changed: reasons to take this   Tresiba FlexTouch 100 UNIT/ML FlexTouch Pen Generic drug: insulin degludec Inject 0.26 mLs (26 Units total) into the skin at bedtime. What changed: how much to take         Disposition:  Home. No new home health needs identified at time of discharge.   Discharged Condition: Heather [name] has met maximum benefit of inpatient care and is medically stable and cleared for discharge.  Patient is pending follow up as above.      Time spent on disposition:  35 Minutes.   Signed:  Erskine Emery MD PCCM

## 2020-05-06 NOTE — Progress Notes (Signed)
Spoke with Dialysis nurse about HD treatment on this patient since the pt's last treatment was on 5/8, but also CCM and Nephrology anticipate treatment to be done today. Dialysis nurse said she cannot guarantee a time right now, but will let me know around 6pm after speaking with nephrology. Will follow up with nurse at 6pm. Pt was updated about the conversation.

## 2020-05-06 NOTE — Progress Notes (Signed)
[facility] Progress Note Patient Name: Heather [name] DOB: 1971/03/25 MRN: [id number]   Date of Service  05/06/2020  HPI/Events of Note  Nursing request for renal function panel in AM.   eICU Interventions  Will order renal function panel at 5 AM.      Intervention Category Major Interventions: Other:  Lysle Dingwall 05/06/2020, 8:17 PM

## 2020-05-06 NOTE — Progress Notes (Signed)
  [facility] Progress Note   Assessment/ Plan:   Hemodialysis prescription: [place] [facility], TTS, 4 hours, 180 dialyzer, BFR 400/DFR 800, EDW 100 kg, 3K/2.25 calcium, right IJ TDC, Mircera 100 mcg IV every 2 weeks, heparin 7000 units bolus, Hectorol 2 mcg IV 3 times weekly  1.  Status post PEA arrest: Transiently intubated for respiratory insufficiency.  Now extubated and appears to be oxygenating well.  Plans noted to trend troponin and continuous telemetry with echocardiogram.  troponins negative.  Per primary. 2.  End-stage renal disease on hemodialysis: Continue TTS schedule. 3.  Hypertension: Significantly elevated blood pressures noted, resume oral antihypertensive medications when safe enough to swallow. 4.  Anemia of chronic kidney disease: We will hold off on ESA administration while in the hospital until blood pressures are better controlled and date of last dose verified. 5.  Secondary hyperparathyroidism: Resume renal diet when able to swallow and resume Hectorol for PTH suppression.  Check phosphorus with labs tomorrow  Subjective:    Seen in room.  Sore in her chest.  Questions about what happened yesterday.  Husband at bedside.     Objective:   BP 129/64   Pulse 87   Temp 98.5 F (36.9 C) (Oral)   Resp 17   Ht 5\' 8"  (1.727 m)   Wt 101.6 kg   SpO2 95%   BMI 34.06 kg/m   Physical Exam: Gen: awake AAO x 3 CVS: RRR Resp: clear bilaterally no c/w/r Abd: soft, nontender, NABS Ext: trace LE edema ACCESS: R IJ [facility]  Labs: BMET Recent Labs  Lab 05/05/20 1557 05/05/20 1722 05/06/20 0220  NA 138 136  --   K 4.6 4.4  --   CL 105 105  --   CO2  --  18*  --   GLUCOSE 188* 269*  --   BUN 41* 43*  --   CREATININE 10.20* 10.09*  --   CALCIUM  --  8.0*  --   PHOS  --  4.8* 5.2*   CBC Recent Labs  Lab 05/05/20 1557 05/05/20 1722 05/06/20 0220  WBC  --  7.6 18.7*  NEUTROABS  --  3.0  --   HGB 10.9* 8.9* 8.3*  HCT 32.0* 30.4* 27.5*   MCV  --  101.0* 100.0  PLT  --  236 158    IR Fluoro Guide CV Line Right 06/01/2019 [id number] Final   Medications:    . apixaban  5 mg Oral BID  . Chlorhexidine Gluconate Cloth  6 each Topical Daily  . Chlorhexidine Gluconate Cloth  6 each Topical Daily  . doxercalciferol  2 mcg Intravenous Q T,Th,Sa-HD  . insulin aspart  0-9 Units Subcutaneous Q4H  . midodrine  5 mg Oral BID WC  . sodium chloride flush  10-40 mL Intracatheter Q12H     Madelon Lips, MD [place] pgr [phone] 05/06/2020, 10:24 AM

## 2020-05-06 NOTE — Progress Notes (Signed)
  Echocardiogram 2D Echocardiogram has been performed.  Arlin Sass A Raistlin Gum 05/06/2020, 11:16 AM

## 2020-05-06 NOTE — Progress Notes (Signed)
NAME:  Heather [name], MRN:  [id number], DOB:  26-Mar-1971, LOS: 1 ADMISSION DATE:  05/05/2020, CONSULTATION DATE:  05/05/2020 REFERRING MD:  Dr. Augustin Coupe, CHIEF COMPLAINT:  Cardiac arrest    Brief History   49 year old female with an extensive past medical history including but not limited to prior CVA in 2018, type 2 diabetes, dyslipidemia, end-stage renal disease on hemodialysis, GERD, hypertension who presented for elective exchange of right tunneled HD catheter by IR.  PCCM was called to interventional radiology for a CODE BLUE.  It was reported patient was significantly hypertensive during case at which time she received labetalol after which patient was seen in witnessed PEA arrest.  ACLS protocol initiated with quick, less than 5-minute, ROSC was achieved.  Per rapid response patient was seen bradycardic in the 40s post ROSC at which time she received atropine.  Due to ineffective mentation patient was electively intubated in IR.  Upon arrival to IR suite patient was slowly waking up with intentional attempts to remove ET tube.  The patient was subsequently extubated post ICU admission.   Past Medical History  Prior CVA 2018 Type 2 diabetes Diabetic neuropathy Dyslipidemia End-stage renal disease on hemodialysis Fibromyalgia Gastroparesis GERD Hypertension Prior PE on Eliquis  Secondary hyperthyroidism Thrombocytopenia Still syndrome dialysis vascular access  Significant Hospital Events   5/10 Admitted after PEA arrest, extubated in ICU   Consults:  Nephrology  PCCM  Procedures:  R Tunneled HD Cath (exchanged) 5/10 >>  Significant Diagnostic Tests:   ECHO PTA 02/08/2019 >> The left ventricle has normal systolic function with an ejection  fraction of 60-65%. The cavity size was normal. There is mildly increased left ventricular wall thickness. Left ventricular diastolic Doppler parameters are consistent with impaired relaxation Elevated mean left atrial pressure. The right  ventricle has normal systolic function. The cavity was normal. There is no increase in right ventricular wall thickness. Small pericardial effusion. The mitral valve is normal in structure. The tricuspid valve is normal in structure. The aortic valve is tricuspid.  The pulmonic valve was normal in structure.   Micro Data:  COVID 5/10 >> negative   Antimicrobials:     Interim history/subjective:  Pt reports chest soreness from CPR.  Denies shortness of breath.  States she previously has arrested after receiving dilaudid in the past.   Objective   Blood pressure (!) 106/58, pulse 91, temperature 98.5 F (36.9 C), temperature source Oral, resp. rate 13, height 5\' 8"  (1.727 m), weight 101.6 kg, SpO2 94 %.    Vent Mode: PSV;CPAP FiO2 (%):  [28 %-40 %] 28 % Pressure Support:  [12 cmH20] 12 cmH20   Intake/Output Summary (Last 24 hours) at 05/06/2020 0849 Last data filed at 05/05/2020 2200 Gross per 24 hour  Intake 45.6 ml  Output --  Net 45.6 ml   Filed Weights   05/05/20 1341  Weight: 101.6 kg    Examination: General: adult female lying in bed in NAD HEENT: MM pink/moist, good dentition, anicteric Neuro: AAOx4, speech clear, MAE CV: s1s2 rrr, no m/r/g PULM:  Non-labored, lungs bilaterally clear  GI: soft, bsx4 active  Extremities: warm/dry, no edema  Skin: no rashes or open lesions.  Left lateral foot with callous, small circular lesion (~ size of pencil eraser) on plantar great toe on left foot - dry, no drainage   Resolved Hospital Problem list     Assessment & Plan:   PEA Arrest Acute Respiratory Insuffiencey  While in IR patient suffered PEA arrest with less  than 5 min down time.  Intubated post arrest, extubated after admission to ICU. High sensitivity troponin negative.  EKG without ischemic changes.  -pulmonary hygiene - IS, mobilize -await follow up electrolytes this am  -follow tele  ESRD on iHD  Last reported iHD 5/8 -anticipate HD 5/11, if tolerates well,  may be able to discharge post HD -appreciate Nephrology assistance with patient care -renal diet as tolerated  -await electrolytes  -continue midodrine   DM II -SSI  -follow CBG -discussed foot care, wound clinic she follows with / shoes etc  Hx of prior PE on chronic anticoagulation  -continue home eliquis  Best practice:  Diet: Renal diet Pain/Anxiety/Delirium protocol (if indicated): n/a VAP protocol (if indicated): In place  DVT prophylaxis: Subq heparin  GI prophylaxis: PPI Glucose control: SSI  Mobility: As tolerated Code Status: Full Family Communication: Patient updated at bedside 5/11  Disposition: ICU  Labs   CBC: Recent Labs  Lab 05/05/20 1557 05/05/20 1722 05/06/20 0220  WBC  --  7.6 18.7*  NEUTROABS  --  3.0  --   HGB 10.9* 8.9* 8.3*  HCT 32.0* 30.4* 27.5*  MCV  --  101.0* 100.0  PLT  --  236 124    Basic Metabolic Panel: Recent Labs  Lab 05/05/20 1557 05/05/20 1722 05/06/20 0220  NA 138 136  --   K 4.6 4.4  --   CL 105 105  --   CO2  --  18*  --   GLUCOSE 188* 269*  --   BUN 41* 43*  --   CREATININE 10.20* 10.09*  --   CALCIUM  --  8.0*  --   MG  --  1.8 1.8  PHOS  --  4.8* 5.2*   GFR: Estimated Creatinine Clearance: 8.3 mL/min (A) (by C-G formula based on SCr of 10.09 mg/dL (H)). Recent Labs  Lab 05/05/20 1722 05/06/20 0220  WBC 7.6 18.7*    Liver Function Tests: Recent Labs  Lab 05/05/20 1722  AST 75*  ALT 79*  ALKPHOS 93  BILITOT 0.4  PROT 5.6*  ALBUMIN 2.3*   No results for input(s): LIPASE, AMYLASE in the last 168 hours. No results for input(s): AMMONIA in the last 168 hours.  ABG    Component Value Date/Time   HCO3 24.8 (H) 06/23/2015 1343   TCO2 21 (L) 05/05/2020 1557   ACIDBASEDEF 1.0 06/23/2015 1343   O2SAT 79.0 06/23/2015 1343     Coagulation Profile: No results for input(s): INR, PROTIME in the last 168 hours.  Cardiac Enzymes: No results for input(s): CKTOTAL, CKMB, CKMBINDEX, TROPONINI in the last  168 hours.  HbA1C: Hemoglobin A1C  Date/Time Value Ref Range Status  04/16/2020 09:38 AM 7.8 (A) 4.0 - 5.6 % Final  12/26/2019 10:16 AM 7.2 (A) 4.0 - 5.6 % Final  11/02/2019 12:00 AM 7.3  Final  04/19/2017 12:00 AM 7.4  Final   Hgb A1c MFr Bld  Date/Time Value Ref Range Status  05/31/2019 10:46 AM 9.1 (H) 4.8 - 5.6 % Final    Comment:    (NOTE) Pre diabetes:          5.7%-6.4% Diabetes:              >6.4% Glycemic control for   <7.0% adults with diabetes   02/07/2019 06:25 AM 9.9 (H) 4.8 - 5.6 % Final    Comment:    (NOTE) Pre diabetes:          5.7%-6.4% Diabetes:              >  6.4% Glycemic control for   <7.0% adults with diabetes     CBG: Recent Labs  Lab 05/05/20 1827 05/05/20 2210 05/06/20 0122 05/06/20 0504 05/06/20 [place], MSN, NP-C [facility] 05/06/2020, 8:49 AM   Please see [email or web address] for pager details.

## 2020-05-06 NOTE — Progress Notes (Signed)
HD orders modified to 05/07/2020 perDr Hollie Salk. Informed Primary RN , Duard Brady at [phone] of above

## 2020-05-07 ENCOUNTER — Encounter (HOSPITAL_COMMUNITY): Payer: Self-pay | Admitting: *Deleted

## 2020-05-07 DIAGNOSIS — I469 Cardiac arrest, cause unspecified: Principal | ICD-10-CM

## 2020-05-07 LAB — RENAL FUNCTION PANEL
Albumin: 2.7 g/dL — ABNORMAL LOW (ref 3.5–5.0)
Anion gap: 9 (ref 5–15)
BUN: 56 mg/dL — ABNORMAL HIGH (ref 6–20)
CO2: 20 mmol/L — ABNORMAL LOW (ref 22–32)
Calcium: 8.3 mg/dL — ABNORMAL LOW (ref 8.9–10.3)
Chloride: 108 mmol/L (ref 98–111)
Creatinine, Ser: 10.98 mg/dL — ABNORMAL HIGH (ref 0.44–1.00)
GFR calc Af Amer: 4 mL/min — ABNORMAL LOW (ref >60)
GFR calc non Af Amer: 4 mL/min — ABNORMAL LOW (ref >60)
Glucose, Bld: 153 mg/dL — ABNORMAL HIGH (ref 70–99)
Phosphorus: 5.5 mg/dL — ABNORMAL HIGH (ref 2.5–4.6)
Potassium: 5.3 mmol/L — ABNORMAL HIGH (ref 3.5–5.1)
Sodium: 137 mmol/L (ref 135–145)

## 2020-05-07 LAB — GLUCOSE, CAPILLARY
Glucose-Capillary: 82 mg/dL (ref 70–99)
Glucose-Capillary: 96 mg/dL (ref 70–99)
Glucose-Capillary: 97 mg/dL (ref 70–99)

## 2020-05-07 MED ORDER — ALTEPLASE 2 MG IJ SOLR
INTRAMUSCULAR | Status: AC
  Administered 2020-05-07: 4 mg
  Filled 2020-05-07: qty 4

## 2020-05-07 MED ORDER — HEPARIN SODIUM (PORCINE) 1000 UNIT/ML IJ SOLN
INTRAMUSCULAR | Status: AC
  Filled 2020-05-07: qty 4

## 2020-05-07 NOTE — Progress Notes (Signed)
Interventional Radiology Brief Note:  Patient admitted after PEA arrest s/p tunneled HD catheter exchange.  [name] in dialysis this AM as planned.   No noted concern re: HD catheter function.  CCM/Nephrology managing; she appears stable after events of Monday afternoon.   IR to sign off. We remain available if needed.   Brynda Greathouse, MS RD PA-C 9:18 AM

## 2020-05-07 NOTE — Progress Notes (Signed)
[place] Progress Note Patient Name: Heather [name] DOB: [birth year] MRN: [id number]   Date of Service  05/07/2020  HPI/Events of Note  Patient c/o chest pain related to CPR. Unfortunately, she has allergies documented to Ibuprofen, Dilaudid and Tramadol.   eICU Interventions  Please give ordered Tylenol.      Intervention Category Major Interventions: Other:  Lysle Dingwall 05/07/2020, 5:01 AM

## 2020-05-07 NOTE — Progress Notes (Signed)
Seen and examined. Patient did not get dialysis. I am unclear on the reason. I have asked dialysis unit to put her on 1st shift and asked Dr. Moshe Cipro to help facilitate, appreciate help.  Discharge is pending her getting dialysis as has been plan for past 24+ hours.  Erskine Emery MD PCCM

## 2020-05-07 NOTE — Progress Notes (Signed)
   [facility] Progress Note   Assessment/ Plan:   Hemodialysis prescription: [place] [facility], TTS, 4 hours, 180 dialyzer, BFR 400/DFR 800, EDW 100 kg, 3K/2.25 calcium, right IJ TDC, Mircera 100 mcg IV every 2 weeks, heparin 7000 units bolus, Hectorol 2 mcg IV 3 times weekly  1.  Status post PEA arrest: Transiently intubated for respiratory insufficiency.  Now extubated and appears to be oxygenating well.  Plans noted to trend troponin and continuous telemetry with echocardiogram.  troponins negative.  Per primary. 2.  End-stage renal disease on hemodialysis: Continue TTS schedule.  Bumped yesterday, HD today 5/12, discussed that she will need to go to HD tomorrow too.  TDC functioning well.   3.  Hypertension: Significantly elevated blood pressures noted, resume oral antihypertensive medications when safe enough to swallow. 4.  Anemia of chronic kidney disease: ESA when appropriate 5.  Secondary hyperparathyroidism: Resume renal diet when able to swallow and resume Hectorol for PTH suppression.   6.  Dispo: tolerating HD well, TDC functioning OK.  From renal perspective OK to go.  Subjective:    No events overnight.  Feels OK.  Dialysis today, bumped from schedule yesterday.      Objective:   BP (!) 192/95   Pulse 85   Temp 98.1 F (36.7 C) (Oral)   Resp 16   Ht 5\' 8"  (1.727 m)   Wt 105.9 kg   SpO2 96%   BMI 35.50 kg/m   Physical Exam: Gen: awake AAO x 3 CVS: RRR Resp: clear bilaterally no c/w/r Abd: soft, nontender, NABS Ext: trace LE edema ACCESS: R IJ [facility]  Labs: BMET Recent Labs  Lab 05/05/20 1557 05/05/20 1722 05/06/20 0220 05/06/20 1856 05/07/20 0819  NA 138 136  --  133* 137  K 4.6 4.4  --  4.6 5.3*  CL 105 105  --  106 108  CO2  --  18*  --  17* 20*  GLUCOSE 188* 269*  --  161* 153*  BUN 41* 43*  --  53* 56*  CREATININE 10.20* 10.09*  --  11.30* 10.98*  CALCIUM  --  8.0*  --  7.8* 8.3*  PHOS  --  4.8* 5.2* 5.5* 5.5*    CBC Recent Labs  Lab 05/05/20 1557 05/05/20 1722 05/06/20 0220 05/06/20 1856  WBC  --  7.6 18.7* 10.6*  NEUTROABS  --  3.0  --   --   HGB 10.9* 8.9* 8.3* 8.0*  HCT 32.0* 30.4* 27.5* 26.4*  MCV  --  101.0* 100.0 100.0  PLT  --  236 158 160    IR Fluoro Guide CV Line Right 06/01/2019 [id number] Final   Medications:    . apixaban  5 mg Oral BID  . Chlorhexidine Gluconate Cloth  6 each Topical Daily  . Chlorhexidine Gluconate Cloth  6 each Topical Daily  . doxercalciferol  2 mcg Intravenous Q T,Th,Sa-HD  . insulin aspart  0-9 Units Subcutaneous Q4H  . midodrine  5 mg Oral BID WC  . sodium chloride flush  10-40 mL Intracatheter Q12H     Madelon Lips, MD [facility] pgr [phone] 05/07/2020, 10:06 AM

## 2020-05-07 NOTE — Progress Notes (Signed)
Patient returned from hemodialysis.  No complications reported during Hemodialysis treatment and patient returned to unit stable but hypertensive.  MD Tamala Julian informed of patient's blood pressure, MD advised okay to continue with discharge.  Patient's IV removed and all belongings at bedside returned to patient.  Discharge instructions reviewed with patient and patient voiced understanding in her own words.

## 2020-05-07 NOTE — Procedures (Signed)
Patient seen and examined on Hemodialysis. BP (!) 192/95   Pulse 85   Temp 98.1 F (36.7 C) (Oral)   Resp 16   Ht 5\' 8"  (1.727 m)   Wt 105.9 kg   SpO2 96%   BMI 35.50 kg/m   QB 300 mL/ min via R IJ TDC UF goal 2.1L  Tolerating treatment without complaints at this time.  No arrhythmias observed on tele.  Monitoring throughout treatment.   Madelon Lips MD [facility] pgr [phone] 10:11 AM

## 2020-05-09 ENCOUNTER — Telehealth: Payer: Self-pay

## 2020-05-09 NOTE — Telephone Encounter (Signed)
PER DISCHARGE _______________________________  Admit date: 05/05/2020 Discharge date: 05/07/2020    Discharge Diagnoses:  PEA Arrest  Acute Respiratory Insufficiency  ESRD on HD  DM II  Diabetic Neuropathy  Gastroparesis Hx PE on Eliquis                                                                                                       DISCHARGE PLAN BY DIAGNOSIS      PEA Arrest  Acute Respiratory Insufficiency   Discharge Plan: -No acute follow up at time of discharge. ECHO without change. EKG without ischemic changes, high sensitivity troponin negative. Question if related to labetalol dosing for HTN in the setting of ESRD? -follow up with primary care provider   Spoke with patient stated she is not feeling well and she will call the office.  Spoke with patient stated she is not feeling well she will call the office Monday to schedule Hospital Follow up ESRD on HD   Discharge Plan: -follow up with Nephrology as previously arranged   DM II  Diabetic Neuropathy  Gastroparesis  Discharge Plan: -follow up with primary care for glucose control  -follow up with wound clinic for foot care / wound prevention measures as previously arranged -consider diabetic shoes

## 2020-05-10 DIAGNOSIS — T82598A Other mechanical complication of other cardiac and vascular devices and implants, initial encounter: Secondary | ICD-10-CM | POA: Insufficient documentation

## 2020-05-12 ENCOUNTER — Telehealth: Payer: Self-pay | Admitting: *Deleted

## 2020-05-12 NOTE — Telephone Encounter (Signed)
Attempted to contact the patient and unable to leave a message, voicemail not set up. She has an upcoming appointment on 05/20/2020. Patient had [facility] labs at any labs test now. [facility] CTD could not be completed as they did not receive whole blood. Patient will need to repeat [facility] labs.

## 2020-05-19 ENCOUNTER — Ambulatory Visit: Payer: Managed Care, Other (non HMO) | Attending: Family Medicine | Admitting: Occupational Therapy

## 2020-05-19 ENCOUNTER — Other Ambulatory Visit: Payer: Self-pay

## 2020-05-19 DIAGNOSIS — R208 Other disturbances of skin sensation: Secondary | ICD-10-CM

## 2020-05-19 DIAGNOSIS — M6281 Muscle weakness (generalized): Secondary | ICD-10-CM | POA: Insufficient documentation

## 2020-05-19 DIAGNOSIS — R209 Unspecified disturbances of skin sensation: Secondary | ICD-10-CM | POA: Insufficient documentation

## 2020-05-19 DIAGNOSIS — R278 Other lack of coordination: Secondary | ICD-10-CM

## 2020-05-20 ENCOUNTER — Encounter: Payer: Self-pay | Admitting: Family Medicine

## 2020-05-20 ENCOUNTER — Ambulatory Visit: Payer: Managed Care, Other (non HMO) | Admitting: Rheumatology

## 2020-05-20 ENCOUNTER — Telehealth (INDEPENDENT_AMBULATORY_CARE_PROVIDER_SITE_OTHER): Payer: Managed Care, Other (non HMO) | Admitting: Family Medicine

## 2020-05-20 DIAGNOSIS — I10 Essential (primary) hypertension: Secondary | ICD-10-CM

## 2020-05-20 DIAGNOSIS — N186 End stage renal disease: Secondary | ICD-10-CM | POA: Diagnosis not present

## 2020-05-20 DIAGNOSIS — Z992 Dependence on renal dialysis: Secondary | ICD-10-CM

## 2020-05-20 DIAGNOSIS — I469 Cardiac arrest, cause unspecified: Secondary | ICD-10-CM

## 2020-05-20 DIAGNOSIS — E1159 Type 2 diabetes mellitus with other circulatory complications: Secondary | ICD-10-CM

## 2020-05-20 MED ORDER — AZELASTINE HCL 0.1 % NA SOLN
2.0000 | Freq: Two times a day (BID) | NASAL | 12 refills | Status: DC
Start: 2020-05-20 — End: 2020-07-16

## 2020-05-20 MED ORDER — DOXYCYCLINE HYCLATE 100 MG PO TABS: 100.0000 mg | ORAL_TABLET | Freq: Two times a day (BID) | ORAL | 0 refills | Status: DC

## 2020-05-20 MED ORDER — BENZONATATE 200 MG PO CAPS
200.0000 mg | ORAL_CAPSULE | Freq: Two times a day (BID) | ORAL | 0 refills | Status: DC | PRN
Start: 2020-05-20 — End: 2020-06-09

## 2020-05-20 NOTE — Assessment & Plan Note (Signed)
We will avoid antihypertensives at this point given recent hospitalization.  Continue monitoring at home and at hemodialysis.

## 2020-05-20 NOTE — Therapy (Signed)
[facility] [address] [place], [place], [address] Phone: [phone]   Fax:  [phone]  Occupational Therapy Treatment  Patient Details  Name: Heather [name] MRN: [id number] Date of Birth: 03-Jun-1971 No data recorded  Encounter Date: 05/19/2020  OT End of Session - 05/20/20 1223    Visit Number  1    Number of Visits  25    Date for OT Re-Evaluation  08/18/20    Authorization Type  [employer]    OT Start Time  1320    OT Stop Time  1356    OT Time Calculation (min)  36 min       Past Medical History:  Diagnosis Date  . Antiplatelet or antithrombotic long-term use: Plavix 06/30/2017  . B12 deficiency 05/10/2014  . Blood transfusion without reported diagnosis   . Chronic constipation   . CVA (cerebral vascular accident) ([place]), nonhemorrhagic, inferior right cerebellum 12/03/2017   left side weakness in leg  . Cyst of left ovary 01/12/2018  . Diabetic neuropathy, painful ([place]), on low dose Gabapentin 07/13/2013  . DM (diabetes mellitus), type 2, uncontrolled, with renal complications ([place]) 28/41/3244  . DM2 (diabetes mellitus, type 2) ([place])   . Dysfunction of left eustachian tube, with pusatile tinnitus 11/24/2017  . Dyslipidemia associated with type 2 diabetes mellitus ([place]) 06/25/2016  . ESRD (end stage renal disease) on dialysis [facility])    On hemodialysis in July 2019 via [facility] then switched to [facility] in Nov 2019.   [name] ESRD with anemia ([place])   . Fibromyalgia   . Gastroparesis due to DM   . GERD (gastroesophageal reflux disease)   . Headache   . Hypertension associated with diabetes ([place]) 02/19/2011  . IBS (irritable bowel syndrome)   . Left-sided weakness 12/10/2017   .  [name] Leiomyoma of uterus   . Pancreatitis   . PE (pulmonary thromboembolism) ([place]) 11/04/2019  . Pneumonia   . Proliferative diabetic retinopathy ([place]) 09/29/2015  . 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 Reactive depression 12/10/2017    .  [name] Right-sided low back pain without sciatica 12/08/2015  . Secondary hyperthyroidism 11/27/2013  . Steal syndrome dialysis vascular access ([place]) 02/17/2018  . Thromboembolism ([place]) 04/05/2018  . Upper airway cough syndrome, with recs to stay off ACE and take Pepcid q hs 11/15/2016    Past Surgical History:  Procedure Laterality Date  . A/V FISTULAGRAM Right 03/03/2020   Procedure: Venogram;  Surgeon: Waynetta Sandy, MD;  Location: [facility];  Service: Cardiovascular;  Laterality: Right;  . ARTERY REPAIR Right 02/17/2018   Procedure: EXPLORATION OF RIGHT BRACHIAL ARTERY;  Surgeon: Conrad [place]ville, MD;  Location: [facility];  Service: Vascular;  Laterality: Right;  . ARTERY REPAIR Right 02/17/2018   Procedure: BRACHIAL ARTERY EXPLORATION AND TRHOMBECTOMY;  Surgeon: Conrad Bluffdale, MD;  Location: [facility] OR;  Service: Vascular;  Laterality: Right;  . AV FISTULA PLACEMENT Left 05/09/2017   Procedure: INSERTION OF ARTERIOVENOUS (AV) GRAFT ARM (ARTEGRAFT);  Surgeon: Conrad Attica, MD;  Location: [facility] OR;  Service: Vascular;  Laterality: Left;  . AV FISTULA PLACEMENT Right 02/17/2018   Procedure: INSERTION OF ARTERIOVENOUS (AV) GORE-TEX GRAFT ARM RIGHT UPPER ARM;  Surgeon: Conrad [place]en Bay, MD;  Location: [facility];  Service: Vascular;  Laterality: Right;  . AV FISTULA PLACEMENT Left 10/10/2019   Procedure: INSERTION OF LEFT ARTERIOVENOUS (AV) ARTEGRAFT GRAFT ARM;  Surgeon: Waynetta Sandy, MD;  Location: [facility];  Service: Vascular;  Laterality: Left;  . AV FISTULA PLACEMENT Right 03/19/2020   Procedure: INSERTION OF ARTERIOVENOUS (AV) GORE-TEX GRAFT IN RIGHT ARM AND VIABAHN STENT IN RIGHT AXILLARY ARTERY;  Surgeon: Waynetta Sandy, MD;  Location: [facility];  Service: Vascular;  Laterality: Right;  . BASCILIC VEIN TRANSPOSITION Left 08/31/2016   Procedure: BASILIC VEIN TRANSPOSITION FIRST STAGE;  Surgeon: Conrad [place], MD;  Location: [facility];  Service: Vascular;  Laterality: Left;  . CENTRAL  VENOUS CATHETER INSERTION Left 07/24/2018   Procedure: INSERTION CENTRAL LINE ADULT;  Surgeon: Conrad Le[place] Knoll, MD;  Location: [facility];  Service: Vascular;  Laterality: Left;  . EYE SURGERY     secondary to diabetic retinopathy   . FOOT SURGERY Right    t"ook bone out- maybe hammer toe"  . INSERTION OF DIALYSIS CATHETER Left 07/24/2018   Procedure: INSERTION OF TUNNELED DIALYSIS CATHETER;  Surgeon: Conrad Toronto, MD;  Location: [facility];  Service: Vascular;  Laterality: Left;  . IR FLUORO GUIDE CV LINE RIGHT  07/21/2018  . IR FLUORO GUIDE CV LINE RIGHT  06/01/2019  . IR FLUORO GUIDE CV LINE RIGHT  05/05/2020  . IR US GUIDE VASC ACCESS RIGHT  07/21/2018  . IR US GUIDE VASC ACCESS RIGHT  06/01/2019  . LIGATION ARTERIOVENOUS GORTEX GRAFT Right 03/20/2020   Procedure: LIGATION ARM ARTERIOVENOUS GORTEX GRAFT With Patch Angioplasty, Thrombectomy;  Surgeon: Waynetta Sandy, MD;  Location: [facility];  Service: Vascular;  Laterality: Right;  . LIGATION OF ARTERIOVENOUS  FISTULA Left 05/09/2017   Procedure: LIGATION OF ARTERIOVENOUS  FISTULA;  Surgeon: Conrad Ewa Gentry, MD;  Location: [facility];  Service: Vascular;  Laterality: Left;  . LOOP RECORDER INSERTION N/A 12/05/2017   Procedure: LOOP RECORDER INSERTION;  Surgeon: Evans Lance, MD;  Location: [facility];  Service: Cardiovascular;  Laterality: N/A;  . MYOMECTOMY    . REMOVAL OF GRAFT Right 02/17/2018   Procedure: REMOVAL OF RIGHT UPPER ARM ARTERIOVENOUS GRAFT;  Surgeon: Conrad [place], MD;  Location: [facility];  Service: Vascular;  Laterality: Right;  . REVISION OF ARTERIOVENOUS GORETEX GRAFT Left 07/24/2018   Procedure: REDO ARTERIOVENOUS GORETEX GRAFT;  Surgeon: Conrad Falls View, MD;  Location: [facility];  Service: Vascular;  Laterality: Left;  . TEE WITHOUT CARDIOVERSION N/A 12/05/2017   Procedure: TRANSESOPHAGEAL ECHOCARDIOGRAM (TEE);  Surgeon: Sanda Klein, MD;  Location: [facility]COPY;  Service: Cardiovascular;  Laterality: N/A;  . UPPER EXTREMITY  VENOGRAPHY Left 07/13/2018   Procedure: UPPER EXTREMITY VENOGRAPHY - Central & Left Arm;  Surgeon: Conrad Waco, MD;  Location: [facility];  Service: Cardiovascular;  Laterality: Left;  . UPPER EXTREMITY VENOGRAPHY Bilateral 09/10/2019   Procedure: UPPER EXTREMITY VENOGRAPHY;  Surgeon: Waynetta Sandy, MD;  Location: [facility];  Service: Cardiovascular;  Laterality: Bilateral;  . UTERINE FIBROID SURGERY    . VITRECTOMY Bilateral     There were no vitals filed for this visit.  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    Pertinent History  49 year old African-American woman with past medical history significant for type 2 diabetes mellitus, hypertension, dyslipidemia, gastroesophageal reflux disease, history of PE and prior history of CVA in 2018.  She last went to hemodialysis on 05/03/2020 and had about 44 minutes of dialysis out of her prescribed 4-hour treatment because of poor catheter flows.  She underwent elective exchange of her  right IJ TDC by interventional radiology that earlier required treatment with labetalol for significant hypertension and postprocedure complicated by development of PEA arrest with ROSC in <5 minutes after rapid CPR/ACLS maneuvers.  She was transiently intubated for airway protection due to transient change in mental status and pt was extubated after transfer to the ICU. 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  No/denies         [facility] OT Assessment - 05/20/20 0001      Assessment   Medical Diagnosis  s/p Heather access creations complicated by steal and likely  monomelic neuropathy( with Heather weakness)    Onset Date/Surgical Date  05/05/20      Precautions   Precautions  Other (comment)   dialysis port   Precaution Comments  BP left arm only      Balance Screen   Has the patient fallen in the past 6 months  No    Has the patient had a decrease in activity level because of a fear of falling?   No    Is the patient reluctant to leave their home because of a fear of falling?   No      Home  Environment   Family/patient expects to be discharged to:  Private residence    Lives With  Spouse      Prior Function   Level of Independence  Independent    Vocation  Full time employment    Vocation Requirements  typing       ADL   Eating/Feeding  Modified independent   unable to hold pen in Heather, eats with left hand   Grooming  Modified independent   with left hand   Upper Body Bathing  Maximal assistance    Lower Body Bathing  Maximal assistance    Upper Body Dressing  Supervision/safety    Lower Body Dressing  Minimal assistance   needs A with tying shoes    Toilet Transfer  Modified independent    Tub/Shower Transfer  --   spong bath     IADL   Shopping  Needs to be accompanied on any shopping trip    Light Housekeeping  Performs light daily tasks such as dishwashing, bed making;Needs help with all home maintenance tasks    Meal Prep  Needs to have meals prepared and served    Medication Management  Is responsible for taking medication in correct dosages at correct time    Financial Management  Requires assistance      Mobility   Mobility Status  --   modified I with rollator     Written Expression   Dominant Hand  Right    Handwriting  --   100% legibile print first name only, very slowly     Cognition   Overall Cognitive Status  Within Functional Limits for tasks assessed      Sensation   Light Touch  Impaired by gross assessment    Hot/Cold  Impaired by gross assessment      Coordination   Fine Motor Movements are Fluid  and Coordinated  No    9 Hole Peg Test  Right;Left    Right 9 Hole Peg Test  --  unable   Box and Blocks  Heather 24 blocks      ROM / Strength   AROM / PROM / Strength  AROM;Strength      AROM   Overall AROM   Deficits    Overall AROM Comments  Heather shoulder flexion 130, wrist flexion/ extension 75/60, grossly 90% finger flexion, unable to oppose ring and small fingers with thumb      Strength   Overall Strength  Deficits    Overall Strength Comments  Heather grossly 3/5-3+/5, LUE 4-/5      Hand Function   Right Hand Grip (lbs)  12.1    Left Hand Grip (lbs)  19.6                         OT Short Term Goals - 05/20/20 1622      OT SHORT TERM GOAL #1   Title  I with inital HEP.    Time  6    Period  Weeks    Status  New    Target Date  07/04/20      OT SHORT TERM GOAL #2   Title  Pt will be I with positioning to minimize risk for injury including splinting prn and sensory precautions.    Time  6    Period  Weeks    Status  New      OT SHORT TERM GOAL #3   Title  Pt will perfrom bathing with min A and dressing with supervision    Time  6    Period  Weeks    Status  New      OT SHORT TERM GOAL #4   Title  Pt will perform basic home managment/ cooking with min A    Time  6    Period  Weeks    Status  New      OT SHORT TERM GOAL #5   Title  Pt will demonstrate ability to write a paragraph in a reasonable amount of time with 100% legibility    Time  6    Period  Weeks    Status  New      Additional Short Term Goals   Additional Short Term Goals  Yes      OT SHORT TERM GOAL #6   Title  Pt will demonstrate improved Heather functional use for ADLs as evidenced  by performing 30 blocks or greater on box/ blocks    Baseline  24 blocks    Time  6    Period  Weeks    Status  New        OT Long Term Goals - 05/20/20 1615      OT LONG TERM GOAL #1   Title  I with updated HEP.    Time  12    Period  Weeks    Status  New    Target Date  08/18/20       OT LONG TERM GOAL #2   Title  Pt will resume use of Heather as her dominant UE at least 75% of the time for ADLs/ IADLs.    Time  12    Period  Weeks    Status  New      OT LONG TERM GOAL #3   Title  Pt will perfrom all basic ADLs modified independently    Time  12    Period  Weeks    Status  New  OT LONG TERM GOAL #4   Title  Pt will perform basic home management and cooking modified independently.    Time  12    Period  Weeks    Status  New      OT LONG TERM GOAL #5   Title  Pt will increase bilateral grip strength by 10 lbs for increased functional use during ADLs.    Time  12    Period  Weeks    Status  New      Long Term Additional Goals   Additional Long Term Goals  Yes      OT LONG TERM GOAL #6   Title  Pt will perform simulated work activities modified independently.    Time  12    Period  Weeks    Status  New            Plan - 05/20/20 1543    Clinical Impression Statement  49 year old African-American woman with past medical history significant for type 2 diabetes mellitus, hypertension, dyslipidemia, gastroesophageal reflux disease, history of PE and prior history of CVA in 2018.  She went to hemodialysis on 05/03/2020 and had about 44 minutes of dialysis out of her prescribed 4-hour treatment because of poor catheter flows.  She underwent elective exchange of her right IJ TDC by interventional radiology that earlier required treatment with labetalol for significant hypertension and postprocedure complicated by development of PEA arrest with ROSC in <5 minutes after rapid CPR/ACLS maneuvers.  She was transiently intubated for airway protection due to transient change in mental status and pt was extubated after transfer to the ICU. 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 Pt can benefit from skilled occupational therapy to maximize pt safety and I with daily activities.    OT Occupational Profile and History  Detailed Assessment- Review of Records and additional review of physical, cognitive, psychosocial history related to current functional performance    Occupational performance deficits (Please refer to evaluation for details):  ADL's;IADL's;Work;Leisure;Social Participation    Body Structure / Function / Physical Skills  ADL;Endurance;UE functional use;Flexibility;FMC;ROM;GMC;Coordination;Decreased knowledge of precautions;Sensation;IADL;Decreased knowledge of use of DME;Strength;Dexterity    Rehab Potential  Good    Clinical Decision Making  Limited treatment options, no task modification necessary    Comorbidities Affecting Occupational Performance:  May have comorbidities impacting occupational performance    Modification or Assistance to Complete Evaluation   No modification of tasks or assist necessary to complete eval    OT Frequency  2x / week    OT Duration  12 weeks    OT Treatment/Interventions  Self-care/ADL training;Ultrasound;Neuromuscular education;Therapeutic exercise;Moist Heat;Manual Therapy;Therapeutic activities;Functional Mobility Training;Electrical Stimulation;Balance training;Passive range of motion;Paraffin;Patient/family education;DME and/or AE instruction;Cryotherapy;Fluidtherapy;Splinting    Plan  inital HEP for coordination/ functional use, consider splinting needs    Consulted and Agree with Plan of Care  Patient       Patient will benefit from skilled therapeutic intervention in order to improve the following deficits and impairments:   Body Structure / Function / Physical Skills: ADL, Endurance, UE functional use, Flexibility, FMC, ROM, GMC, Coordination, Decreased knowledge of precautions, Sensation, IADL, Decreased knowledge of use of DME, Strength,  Dexterity       Visit Diagnosis: Muscle weakness (generalized)  Other lack of coordination  Other disturbances of  skin sensation    Problem List Patient Active Problem List   Diagnosis Date Noted  . Positive ANA (antinuclear antibody) 04/16/2020  . ESRD on dialysis ([place])   . Hemiplegia and hemiparesis following cerebral infarction affecting left non-dominant side ([place]) 12/29/2018  . IBS (irritable bowel syndrome) 08/06/2017  . Dyslipidemia associated with type 2 diabetes mellitus ([place]) 06/25/2016  . Proliferative diabetic retinopathy ([place]) 09/29/2015  . B12 deficiency 05/10/2014  . Anemia in chronic kidney disease 04/26/2014  . CKD (chronic kidney disease) stage 5, GFR less than 15 ml/min ([facility]) 04/26/2014  . Secondary hyperparathyroidism of renal origin ([place]) 11/27/2013  . Posterior subcapsular cataract, bilateral 10/18/2013  . Diabetic neuropathy, painful ([place]), on low dose Gabapentin 07/13/2013  . Diabetes mellitus, type II, insulin dependent ([place]), on CGM, with end organ damage: renal, neuropathy, gastroparesis, retinopathy 02/19/2011  . Hypertension associated with diabetes ([place]) 02/19/2011  . Gastroparesis due to DM ([place]) 02/19/2011    Sahara Fujimoto 05/20/2020, 4:26 PM Theone Murdoch, OTR/L [phone] Phone: [phone] 4:28 PM 05/20/20 [facility] [address] [place], [place], [address] Phone: [phone]   Fax:  [phone]  Name: Heather [name] MRN: [id number] Date of Birth: [birth year]

## 2020-05-20 NOTE — Assessment & Plan Note (Signed)
Continue management per nephrology.  Will be getting hemodialysis catheter exchanged again soon.

## 2020-05-20 NOTE — Assessment & Plan Note (Signed)
No recurrence of symptoms.  Likely secondary to IV labetalol push.  Do not need to do any further work-up at this point.

## 2020-05-20 NOTE — Progress Notes (Signed)
Chief Complaint:  Heather [name] is a 49 y.o. female who presents today for a  VIRTUAL TCM visit.  Assessment/Plan:  New/Acute Problems: Cough Possibly related to recent intubation.  Will start azithromycin and Astelin.  Also start Tessalon.  If no improvement the next week or so she will come in for in person office visit and we will get chest x-ray.  Covid test was negative.  Cardiac arrest ([place]) No recurrence of symptoms.  Likely secondary to IV labetalol push.  Do not need to do any further work-up at this point.  ESRD on dialysis [facility]) Continue management per nephrology.  Will be getting hemodialysis catheter exchanged again soon.  Hypertension associated with diabetes ([place]) We will avoid antihypertensives at this point given recent hospitalization.  Continue monitoring at home and at hemodialysis.      Subjective:  HPI:  Summary of Hospital admission: Reason for admission: Cardiac arrest Date of admission: 05/05/2020 Date of discharge: 05/07/2020 Date of Interactive contact: 05/09/2020 Summary of Hospital course: Patient had witnessed cardiac arrest on 05/05/2020 while she was undergoing a procedure to have her catheter replaced.  She was notably hypertensive to 220s.  She was given 20 mg of labetalol and then lost consciousness and found to be in PEA arrest.  ACLS protocol was initiated and return of spontaneous circulation was achieved less than 5 minutes.  She was intubated and admitted to the ICU.  She was able to be quickly extubated after admission.  It was thought that her PEA arrest was secondary to labetalol.  She underwent hemodialysis on second of admission and tolerated well.  She was discharged home in stable condition.  Interim history:  Patient has noticed persistent cough for the past few weeks.  Over the last week or so she has noticed a little chest pain with a cough.  No shortness of breath.  She has tried over-the-counter medications with no  improvement.  Some increased runny nose and sniffles as well.  She has to get her hemodialysis catheter replaced as it is still not working.  She has not had any other issues since her hospitalization a couple of weeks ago.  No syncopal episodes.   ROS: Per HPI, otherwise a complete review of systems was negative.   PMH:  The following were reviewed and entered/updated in epic: Past Medical History:  Diagnosis Date  . Antiplatelet or antithrombotic long-term use: Plavix 06/30/2017  . B12 deficiency 05/10/2014  . Blood transfusion without reported diagnosis   . Chronic constipation   . 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 Cyst of left ovary 01/12/2018  . Diabetic neuropathy, painful ([place]), on low dose Gabapentin 07/13/2013  . DM (diabetes mellitus), type 2, uncontrolled, with renal complications ([place]) 56/38/9373  . DM2 (diabetes mellitus, type 2) ([place])   . Dysfunction of left eustachian tube, with pusatile tinnitus 11/24/2017  . Dyslipidemia associated with type 2 diabetes mellitus ([place]) 06/25/2016  . ESRD (end stage renal disease) on dialysis [facility])    On hemodialysis in July 2019 via [facility] then switched to [facility] in Nov 2019.   [name] ESRD with anemia ([place])   . Fibromyalgia   . Gastroparesis due to DM   . GERD (gastroesophageal reflux disease)   . Headache   . Hypertension associated with diabetes ([place]) 02/19/2011  . IBS (irritable bowel syndrome)   . Left-sided weakness 12/10/2017   .  [name] Leiomyoma of uterus   .  Pancreatitis   . PE (pulmonary thromboembolism) ([place]) 11/04/2019  . Pneumonia   . Proliferative diabetic retinopathy ([place]) 09/29/2015  . 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 Reactive depression 12/10/2017   .  [name] Right-sided low back pain without sciatica 12/08/2015  . Secondary hyperthyroidism 11/27/2013  . Steal syndrome dialysis vascular access ([place]) 02/17/2018  . Thromboembolism  ([place]) 04/05/2018  . Upper airway cough syndrome, with recs to stay off ACE and take Pepcid q hs 11/15/2016   Patient Active Problem List   Diagnosis Date Noted  . Positive ANA (antinuclear antibody) 04/16/2020  . ESRD on dialysis ([place])   . Hemiplegia and hemiparesis following cerebral infarction affecting left non-dominant side ([place]) 12/29/2018  . IBS (irritable bowel syndrome) 08/06/2017  . Dyslipidemia associated with type 2 diabetes mellitus ([place]) 06/25/2016  . Proliferative diabetic retinopathy ([place]) 09/29/2015  . B12 deficiency 05/10/2014  . Anemia in chronic kidney disease 04/26/2014  . CKD (chronic kidney disease) stage 5, GFR less than 15 ml/min (HCC) 04/26/2014  . Secondary hyperparathyroidism of renal origin ([place]) 11/27/2013  . Posterior subcapsular cataract, bilateral 10/18/2013  . Diabetic neuropathy, painful ([place]), on low dose Gabapentin 07/13/2013  . Diabetes mellitus, type II, insulin dependent ([place]), on CGM, with end organ damage: renal, neuropathy, gastroparesis, retinopathy 02/19/2011  . Hypertension associated with diabetes ([place]) 02/19/2011  . Gastroparesis due to DM ([place]) 02/19/2011   Past Surgical History:  Procedure Laterality Date  . A/V FISTULAGRAM Right 03/03/2020   Procedure: Venogram;  Surgeon: Waynetta Sandy, MD;  Location: [place] [facility];  Service: Cardiovascular;  Laterality: Right;  . ARTERY REPAIR Right 02/17/2018   Procedure: EXPLORATION OF RIGHT BRACHIAL ARTERY;  Surgeon: Conrad [place], MD;  Location: [place];  Service: Vascular;  Laterality: Right;  . ARTERY REPAIR Right 02/17/2018   Procedure: BRACHIAL ARTERY EXPLORATION AND TRHOMBECTOMY;  Surgeon: Conrad Wilson, MD;  [facility] OR;  Service: Vascular;  Laterality: Right;  . AV FISTULA PLACEMENT Left 05/09/2017   Procedure: INSERTION OF ARTERIOVENOUS (AV) GRAFT ARM (ARTEGRAFT);  Surgeon: Conrad [place], MD;  Location: [facility] OR;  Service: Vascular;  Laterality: Left;  . AV FISTULA PLACEMENT Right  02/17/2018   Procedure: INSERTION OF ARTERIOVENOUS (AV) GORE-TEX GRAFT ARM RIGHT UPPER ARM;  Surgeon: Conrad [place], MD;  Location: [place];  Service: Vascular;  Laterality: Right;  . AV FISTULA PLACEMENT Left 10/10/2019   Procedure: INSERTION OF LEFT ARTERIOVENOUS (AV) ARTEGRAFT GRAFT ARM;  Surgeon: Waynetta Sandy, MD;  Location: [place];  Service: Vascular;  Laterality: Left;  . AV FISTULA PLACEMENT Right 03/19/2020   Procedure: INSERTION OF ARTERIOVENOUS (AV) GORE-TEX GRAFT IN RIGHT ARM AND VIABAHN STENT IN RIGHT AXILLARY ARTERY;  Surgeon: Waynetta Sandy, MD;  Location: [place];  Service: Vascular;  Laterality: Right;  . BASCILIC VEIN TRANSPOSITION Left 08/31/2016   Procedure: BASILIC VEIN TRANSPOSITION FIRST STAGE;  Surgeon: Conrad [place], MD;  Location: [place];  Service: Vascular;  Laterality: Left;  . CENTRAL VENOUS CATHETER INSERTION Left 07/24/2018   Procedure: INSERTION CENTRAL LINE ADULT;  Surgeon: Conrad [place], MD;  Location: [place];  Service: Vascular;  Laterality: Left;  . EYE SURGERY     secondary to diabetic retinopathy   . FOOT SURGERY Right    t"ook bone out- maybe hammer toe"  . INSERTION OF DIALYSIS CATHETER Left 07/24/2018   Procedure: INSERTION OF TUNNELED DIALYSIS CATHETER;  Surgeon: Conrad , MD;  Location: [place];  Service: Vascular;  Laterality: Left;  . IR FLUORO GUIDE CV LINE RIGHT  07/21/2018  . IR FLUORO GUIDE CV LINE RIGHT  06/01/2019  . IR FLUORO GUIDE CV LINE RIGHT  05/05/2020  . IR US GUIDE VASC ACCESS RIGHT  07/21/2018  . IR US GUIDE VASC ACCESS RIGHT  06/01/2019  . LIGATION ARTERIOVENOUS GORTEX GRAFT Right 03/20/2020   Procedure: LIGATION ARM ARTERIOVENOUS GORTEX GRAFT With Patch Angioplasty, Thrombectomy;  Surgeon: Waynetta Sandy, MD;  Location: [place];  Service: Vascular;  Laterality: Right;  . LIGATION OF ARTERIOVENOUS  FISTULA Left 05/09/2017   Procedure: LIGATION OF ARTERIOVENOUS  FISTULA;  Surgeon: Conrad [place], MD;  Location: [place];   Service: Vascular;  Laterality: Left;  . LOOP RECORDER INSERTION N/A 12/05/2017   Procedure: LOOP RECORDER INSERTION;  Surgeon: Evans Lance, MD;  Location: [place] [facility];  Service: Cardiovascular;  Laterality: N/A;  . MYOMECTOMY    . REMOVAL OF GRAFT Right 02/17/2018   Procedure: REMOVAL OF RIGHT UPPER ARM ARTERIOVENOUS GRAFT;  Surgeon: Conrad [place], MD;  Location: [place];  Service: Vascular;  Laterality: Right;  . REVISION OF ARTERIOVENOUS GORETEX GRAFT Left 07/24/2018   Procedure: REDO ARTERIOVENOUS GORETEX GRAFT;  Surgeon: Conrad [place], MD;  Location: [place];  Service: Vascular;  Laterality: Left;  . TEE WITHOUT CARDIOVERSION N/A 12/05/2017   Procedure: TRANSESOPHAGEAL ECHOCARDIOGRAM (TEE);  Surgeon: Sanda Klein, MD;  Location: [facility] ENDOSCOPY;  Service: Cardiovascular;  Laterality: N/A;  . UPPER EXTREMITY VENOGRAPHY Left 07/13/2018   Procedure: UPPER EXTREMITY VENOGRAPHY - Central & Left Arm;  Surgeon: Conrad , MD;  Location: [place] [facility];  Service: Cardiovascular;  Laterality: Left;  . UPPER EXTREMITY VENOGRAPHY Bilateral 09/10/2019   Procedure: UPPER EXTREMITY VENOGRAPHY;  Surgeon: Waynetta Sandy, MD;  Location: [place] [facility];  Service: Cardiovascular;  Laterality: Bilateral;  . UTERINE FIBROID SURGERY    . VITRECTOMY Bilateral     Family History  Problem Relation Age of Onset  . Colon polyps Mother   . Diabetes Mother   . Heart murmur Father        history essentially unknown  . Hypertension Father   . Diabetes Sister   . Kidney failure Sister        kidney transplant  . Diabetes Brother   . Retinal degeneration Brother   . Heart murmur Son   . Stroke Paternal Grandmother   . Diabetes Sister     Medications- Reconciled discharge and current medications in Epic.  Current Outpatient Medications  Medication Sig Dispense Refill  . B Complex-C-Zn-Folic Acid (DIALYVITE 354-TGYB 15) 0.8 MG TABS Take 1 tablet by mouth daily.    . Continuous  Blood Gluc Sensor (FREESTYLE LIBRE 14 DAY SENSOR) MISC 1 patch by Does not apply route every 14 (fourteen) days. 2 each 6  . ELIQUIS 5 MG TABS tablet Take 5 mg by mouth 2 (two) times daily.     [name] gabapentin (NEURONTIN) 300 MG capsule Take 1 capsule (300 mg total) by mouth daily as needed (for pain). Take after dialysis 180 capsule 5  . insulin aspart (NOVOLOG FLEXPEN) 100 UNIT/ML FlexPen Inject 10 Units into the skin 3 (three) times daily with meals. Sliding scale (Patient taking differently: Inject 6 Units into the skin 3 (three) times daily with meals. Sliding scale) 27 mL 3  . insulin degludec (TRESIBA FLEXTOUCH) 100 UNIT/ML SOPN FlexTouch Pen Inject 0.26 mLs (26 Units total) into the skin at bedtime. 24 mL 3  .  Insulin Pen Needle 29G X 5MM MISC 1 Device by Does not apply route 4 (four) times daily. 400 each 3  . lubiprostone (AMITIZA) 24 MCG capsule Take 1 capsule (24 mcg total) by mouth 2 (two) times daily with a meal. (Patient taking differently: Take 24 mcg by mouth daily. )    . midodrine (PROAMATINE) 5 MG tablet TAKE 1 TABLET (5 MG TOTAL) BY MOUTH 2 (TWO) TIMES DAILY WITH A MEAL. (Patient taking differently: Take 5 mg by mouth daily as needed (hypotension). ) 180 tablet 0  . montelukast (SINGULAIR) 10 MG tablet Take 1 tablet (10 mg total) by mouth at bedtime. (Patient taking differently: Take 10 mg by mouth at bedtime as needed (allergies). ) 30 tablet 3  . aspirin-acetaminophen-caffeine (EXCEDRIN MIGRAINE) 250-250-65 MG tablet Take 1 tablet by mouth daily as needed for headache.    [name] azelastine (ASTELIN) 0.1 % nasal spray Place 2 sprays into both nostrils 2 (two) times daily. 30 mL 12  . benzonatate (TESSALON) 200 MG capsule Take 1 capsule (200 mg total) by mouth 2 (two) times daily as needed for cough. 20 capsule 0  . doxycycline (VIBRA-TABS) 100 MG tablet Take 1 tablet (100 mg total) by mouth 2 (two) times daily. 14 tablet 0  . promethazine (PHENERGAN) 12.5 MG tablet Take 1 tablet (12.5 mg  total) by mouth every 8 (eight) hours as needed. (Patient not taking: Reported on 05/20/2020) 30 tablet 3   No current facility-administered medications for this visit.    Allergies-reviewed and updated Allergies  Allergen Reactions  . Ibuprofen Other (See Comments)    CKD stage 3. Should avoid.  . Dilaudid [Hydromorphone Hcl] Itching and Other (See Comments)    Can take with Benadryl.  . Tramadol Itching    Social History   Socioeconomic History  . Marital status: Married    Spouse name: Not on file  . Number of children: 1  . Years of education: college  . Highest education level: Not on file  Occupational History  . Occupation: Armed forces technical officer: [employer]  Tobacco Use  . Smoking status: Never Smoker  . Smokeless tobacco: Never Used  Substance and Sexual Activity  . Alcohol use: No    Alcohol/week: 0.0 standard drinks  . Drug use: No  . Sexual activity: Not Currently    Partners: Male    Birth control/protection: I.U.D.  Other Topics Concern  . Not on file  Social History Narrative   Patient lives with fiance. She has 1 grown son. Works full-time, she Paramedic for attorney.   Caffeine Use: 2 cups daily; sodas occasionally   She has 7 grandchildren   Social Determinants of Radio broadcast assistant Strain:   . Difficulty of Paying Living Expenses:   Food Insecurity:   . Worried About Charity fundraiser in the Last Year:   . Arboriculturist in the Last Year:   Transportation Needs:   . Film/video editor (Medical):   [name] Lack of Transportation (Non-Medical):   Physical Activity:   . Days of Exercise per Week:   . Minutes of Exercise per Session:   Stress:   . Feeling of Stress :   Social Connections:   . Frequency of Communication with Friends and Family:   . Frequency of Social Gatherings with Friends and Family:   . Attends Religious Services:   . Active Member of Clubs or Organizations:   . Attends Club or  Organization Meetings:   [name] Marital Status:         Objective:  Physical Exam: There were no vitals taken for this visit.  Gen: NAD, resting comfortably Neuro: Grossly normal, moves all extremities Psych: Normal affect and thought content  Virtual Visit via Video   I connected with Sukari Grist Scott-Parrott on 05/20/20 at 11:40 AM EDT by a video enabled telemedicine application and verified that I am speaking with the correct person using two identifiers. The limitations of evaluation and management by telemedicine and the availability of in person appointments were discussed. The patient expressed understanding and agreed to proceed.   Patient location: Home Provider location: [place] participating in the virtual visit: Myself and Patient     [name]. Jerline Pain, MD 05/20/2020 12:22 PM

## 2020-05-28 ENCOUNTER — Telehealth: Payer: Self-pay | Admitting: Family Medicine

## 2020-05-28 NOTE — Telephone Encounter (Signed)
Noted, patient had A1C checked in April

## 2020-05-28 NOTE — Telephone Encounter (Signed)
Heather [name] from [facility] is calling in to let Dr.Parker know that the patient has a case management plan so if any questions, you could reach out to her. Also seen that the patient was last seen in April for a diabetes follow up, wanted to know if patient received an F6E test, if not Danton Clap states that she is past due.

## 2020-06-04 ENCOUNTER — Other Ambulatory Visit: Payer: Self-pay

## 2020-06-04 ENCOUNTER — Ambulatory Visit (INDEPENDENT_AMBULATORY_CARE_PROVIDER_SITE_OTHER): Payer: Managed Care, Other (non HMO) | Admitting: Family Medicine

## 2020-06-04 VITALS — BP 132/84 | HR 88 | Temp 98.1°F | Ht 68.0 in | Wt 227.2 lb

## 2020-06-04 DIAGNOSIS — N185 Chronic kidney disease, stage 5: Secondary | ICD-10-CM | POA: Diagnosis not present

## 2020-06-04 DIAGNOSIS — R05 Cough: Secondary | ICD-10-CM | POA: Diagnosis not present

## 2020-06-04 DIAGNOSIS — I10 Essential (primary) hypertension: Secondary | ICD-10-CM

## 2020-06-04 DIAGNOSIS — R0602 Shortness of breath: Secondary | ICD-10-CM | POA: Diagnosis not present

## 2020-06-04 DIAGNOSIS — E119 Type 2 diabetes mellitus without complications: Secondary | ICD-10-CM

## 2020-06-04 DIAGNOSIS — I152 Hypertension secondary to endocrine disorders: Secondary | ICD-10-CM

## 2020-06-04 DIAGNOSIS — D631 Anemia in chronic kidney disease: Secondary | ICD-10-CM

## 2020-06-04 DIAGNOSIS — Z794 Long term (current) use of insulin: Secondary | ICD-10-CM

## 2020-06-04 DIAGNOSIS — E1159 Type 2 diabetes mellitus with other circulatory complications: Secondary | ICD-10-CM

## 2020-06-04 DIAGNOSIS — R059 Cough, unspecified: Secondary | ICD-10-CM

## 2020-06-04 LAB — CBC
HCT: 31.3 % — ABNORMAL LOW (ref 36.0–46.0)
Hemoglobin: 9.6 g/dL — ABNORMAL LOW (ref 12.0–15.0)
MCHC: 30.8 g/dL (ref 30.0–36.0)
MCV: 102.2 fl — ABNORMAL HIGH (ref 78.0–100.0)
Platelets: 238 10*3/uL (ref 150.0–400.0)
RBC: 3.06 Mil/uL — ABNORMAL LOW (ref 3.87–5.11)
RDW: 17.6 % — ABNORMAL HIGH (ref 11.5–15.5)
WBC: 7.3 10*3/uL (ref 4.0–10.5)

## 2020-06-04 LAB — IBC + FERRITIN
Ferritin: 1325.3 ng/mL — ABNORMAL HIGH (ref 10.0–291.0)
Iron: 63 ug/dL (ref 42–145)
Saturation Ratios: 25.4 % (ref 20.0–50.0)
Transferrin: 177 mg/dL — ABNORMAL LOW (ref 212.0–360.0)

## 2020-06-04 NOTE — Assessment & Plan Note (Signed)
At goal off medications.  Continue monitoring at home and at hemodialysis.

## 2020-06-04 NOTE — Assessment & Plan Note (Signed)
Continue current regimen of Tresiba 26 units daily and NovoLog 10 units 3 times daily.  We will need to recheck A1c soon.

## 2020-06-04 NOTE — Progress Notes (Signed)
   Heather [name] is a 49 y.o. female who presents today for an office visit.  Assessment/Plan:  New/Acute Problems: Cough Normal heart and lung exam today.  It is possible her symptoms could be due to recent intubation few weeks ago.  Will check chest x-ray today to rule out any possible infectious etiology.  [name] also anemic which could be contributing some to her shortness of breath.  Will check labs as below.  May need referral to pulmonology if continues to have persistent cough and dyspnea on exertion.  Chronic Problems Addressed Today: Anemia in chronic kidney disease Check iron panel and CBC.  Hypertension associated with diabetes ([place]) At goal off medications.  Continue monitoring at home and at hemodialysis.  Diabetes mellitus, type II, insulin dependent ([place]), on CGM, with end organ damage: renal, neuropathy, gastroparesis, retinopathy Continue current regimen of Tresiba 26 units daily and NovoLog 10 units 3 times daily.  We will need to recheck A1c soon.      Subjective:  HPI:  Patient was seen couple weeks ago after suffering cardiac arrest.  At that time she was having cough and shortness of breath.  She was started on doxycycline and Astelin as well as Tessalon.  Symptoms have not improved since then.  Still has cough and shortness of breath.  Overall symptoms seem to be stable.  She does not feel like the medications helped.  Still has quite a bit of cough, fatigue, and shortness of breath with exertion.  She has been going to dialysis regularly.  She was noted to have low hemoglobin into the 7 range at her last dialysis session.  She has not received any transfusion.       Objective:  Physical Exam: BP 132/84   Pulse 88   Temp 98.1 F (36.7 C)   Ht 5\' 8"  (1.727 m)   Wt 227 lb 4 oz (103.1 kg)   SpO2 98%   BMI 34.55 kg/m   Gen: No acute distress, resting comfortably CV: Regular rate and rhythm with no murmurs appreciated Pulm: Normal work of breathing,  clear to auscultation bilaterally with no crackles, wheezes, or rhonchi Neuro: Grossly normal, moves all extremities Psych: Normal affect and thought content      Heather Clowdus M. Jerline Pain, MD 06/04/2020 11:38 AM

## 2020-06-04 NOTE — Assessment & Plan Note (Signed)
Check iron panel and CBC. ?

## 2020-06-04 NOTE — Patient Instructions (Signed)
It was very nice to see you today!  We will check blood work and a chest xray today.   You can use over the counter voltaren.  We may need to send you to a lung specialist if your xray and blood work is normal.   Take care, Dr Jerline Pain  Please try these tips to maintain a healthy lifestyle:   Eat at least 3 REAL meals and 1-2 snacks per day.  Aim for no more than 5 hours between eating.  If you eat breakfast, please do so within one hour of getting up.    Each meal should contain half fruits/vegetables, one quarter protein, and one quarter carbs (no bigger than a computer mouse)   Cut down on sweet beverages. This includes juice, soda, and sweet tea.     Drink at least 1 glass of water with each meal and aim for at least 8 glasses per day   Exercise at least 150 minutes every week.

## 2020-06-05 ENCOUNTER — Ambulatory Visit (INDEPENDENT_AMBULATORY_CARE_PROVIDER_SITE_OTHER)
Admission: RE | Admit: 2020-06-05 | Discharge: 2020-06-05 | Disposition: A | Discharge status: Home / Self Care | Payer: Managed Care, Other (non HMO) | Source: Ambulatory Visit | Attending: Family Medicine | Admitting: Family Medicine

## 2020-06-05 DIAGNOSIS — R0602 Shortness of breath: Secondary | ICD-10-CM

## 2020-06-05 DIAGNOSIS — R05 Cough: Secondary | ICD-10-CM | POA: Diagnosis not present

## 2020-06-05 DIAGNOSIS — R059 Cough, unspecified: Secondary | ICD-10-CM

## 2020-06-06 ENCOUNTER — Encounter: Payer: Self-pay | Admitting: Vascular Surgery

## 2020-06-06 ENCOUNTER — Other Ambulatory Visit: Payer: Self-pay

## 2020-06-06 ENCOUNTER — Ambulatory Visit (INDEPENDENT_AMBULATORY_CARE_PROVIDER_SITE_OTHER): Payer: Self-pay | Admitting: Vascular Surgery

## 2020-06-06 VITALS — BP 203/112 | HR 96 | Temp 98.2°F | Resp 20 | Ht 68.0 in | Wt 223.0 lb

## 2020-06-06 DIAGNOSIS — N186 End stage renal disease: Secondary | ICD-10-CM

## 2020-06-06 DIAGNOSIS — Z992 Dependence on renal dialysis: Secondary | ICD-10-CM

## 2020-06-06 NOTE — Progress Notes (Signed)
Please inform patient of the following:  Blood work and chest xray are both stable. No explanation for her shortness of breath.  Recommend referral to pulmonology as we discussed at her last office visit if her symptoms are still not improving.  Heather [name]. Jerline Pain, MD 06/06/2020 2:31 PM

## 2020-06-06 NOTE — Progress Notes (Signed)
    Subjective:     Patient ID: Heather [name], female   DOB: 02/24/1971, 49 y.o.   MRN: [id number]  HPI 49 year old female follows up for right hand dysfunction after AV graft placement.  She did have palpable pulses afterwards but had significant dysfunction of the hand the graft was ligated and the artery was patched.  She does have some discoloration irritation of the upper arm at the site of the graft.  She has some improvement in her ability to write but overall cannot feel most of the lateral aspect of her hand cannot make a good fist and has significant dysfunction.   Review of Systems  As above    Objective:   Physical Exam Vitals:   06/06/20 1015  BP: (!) 203/112  Pulse: 96  Resp: 20  Temp: 98.2 F (36.8 C)  SpO2: 97%   Awake alert oriented Nonlabored respirations Palpable right upper extremity pulses Right grip strength severely weakened    Assessment/plan     49 year old female with above-noted right hand dysfunction after access surgery with ligation of the graft and patch of the brachial artery.  Hand is well-perfused although she does have significant deformity and dysfunction.  We have referred to occupational therapy and she is beginning with them has 8 weeks of scheduled therapy.  Given that it has been 3 months we will refer to neurology for evaluation of nerve conduction.  She may ultimately need evaluation by hand surgery but this time she will continue with occupational therapy and we will follow her up when that is complete.  She will continue to dialyze via catheter previously had a foot wound on the left this is now healed would be high risk for any access procedures at this time.  Evaleen Sant C. Donzetta Matters, MD [facility] Office: [phone] Pager: [phone]. Donzetta Matters, MD [facility] [place] Office: [phone] Pager: [phone]

## 2020-06-09 ENCOUNTER — Ambulatory Visit: Payer: Managed Care, Other (non HMO) | Admitting: Occupational Therapy

## 2020-06-09 ENCOUNTER — Other Ambulatory Visit: Payer: Self-pay

## 2020-06-09 MED ORDER — BENZONATATE 200 MG PO CAPS: 200.0000 mg | ORAL_CAPSULE | Freq: Two times a day (BID) | ORAL | 1 refills | Status: DC | PRN

## 2020-06-10 DIAGNOSIS — E8770 Fluid overload, unspecified: Secondary | ICD-10-CM | POA: Insufficient documentation

## 2020-06-16 ENCOUNTER — Encounter: Payer: Managed Care, Other (non HMO) | Admitting: Occupational Therapy

## 2020-06-18 ENCOUNTER — Other Ambulatory Visit: Payer: Self-pay

## 2020-06-18 ENCOUNTER — Ambulatory Visit: Payer: Managed Care, Other (non HMO) | Attending: Family Medicine | Admitting: Occupational Therapy

## 2020-06-18 DIAGNOSIS — M6281 Muscle weakness (generalized): Secondary | ICD-10-CM

## 2020-06-18 DIAGNOSIS — R209 Unspecified disturbances of skin sensation: Secondary | ICD-10-CM | POA: Insufficient documentation

## 2020-06-18 DIAGNOSIS — R208 Other disturbances of skin sensation: Secondary | ICD-10-CM

## 2020-06-18 DIAGNOSIS — R278 Other lack of coordination: Secondary | ICD-10-CM | POA: Diagnosis present

## 2020-06-18 NOTE — Therapy (Signed)
[facility] [address] [place], [place], [address] Phone: [phone]   Fax:  [phone]  Occupational Therapy Treatment  Patient Details  Name: Heather [name] MRN: [id number] Date of Birth: 1971/10/09 Referring Provider (OT): Dr. Donzetta Matters   Encounter Date: 06/18/2020   OT End of Session - 06/19/20 1700    Visit Number 2    Number of Visits 25    Date for OT Re-Evaluation 08/18/20    Authorization Type [employer]    OT Start Time 1751    OT Stop Time 1830    OT Time Calculation (min) 39 min    Activity Tolerance Patient tolerated treatment well    Behavior During Therapy [facility] for tasks assessed/performed           Past Medical History:  Diagnosis Date  . Antiplatelet or antithrombotic long-term use: Plavix 06/30/2017  . B12 deficiency 05/10/2014  . Blood transfusion without reported diagnosis   . Chronic constipation   . CVA (cerebral vascular accident) ([place]), nonhemorrhagic, inferior right cerebellum 12/03/2017   left side weakness in leg  . Cyst of left ovary 01/12/2018  . Diabetic neuropathy, painful ([place]), on low dose Gabapentin 07/13/2013  . DM (diabetes mellitus), type 2, uncontrolled, with renal complications ([place]) 34/19/3790  . DM2 (diabetes mellitus, type 2) ([place])   . Dysfunction of left eustachian tube, with pusatile tinnitus 11/24/2017  . Dyslipidemia associated with type 2 diabetes mellitus ([place]) 06/25/2016  . ESRD (end stage renal disease) on dialysis [facility])    On hemodialysis in July 2019 via [facility] then switched to [facility] in Nov 2019.   [name] ESRD with anemia ([place])   . Fibromyalgia   . Gastroparesis due to DM   . GERD (gastroesophageal reflux disease)   . Headache   . Hypertension associated with diabetes ([place]) 02/19/2011  . IBS (irritable bowel syndrome)   . Left-sided weakness 12/10/2017   .  [name] Leiomyoma of uterus   . Pancreatitis   . PE (pulmonary thromboembolism) ([place]) 11/04/2019  . Pneumonia   .  Proliferative diabetic retinopathy ([place]) 09/29/2015  . 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 Reactive depression 12/10/2017   .  [name] Right-sided low back pain without sciatica 12/08/2015  . Secondary hyperthyroidism 11/27/2013  . Steal syndrome dialysis vascular access ([place]) 02/17/2018  . Thromboembolism ([place]) 04/05/2018  . Upper airway cough syndrome, with recs to stay off ACE and take Pepcid q hs 11/15/2016    Past Surgical History:  Procedure Laterality Date  . A/V FISTULAGRAM Right 03/03/2020   Procedure: Venogram;  Surgeon: Waynetta Sandy, MD;  Location: [place] [facility];  Service: Cardiovascular;  Laterality: Right;  . ARTERY REPAIR Right 02/17/2018   Procedure: EXPLORATION OF RIGHT BRACHIAL ARTERY;  Surgeon: Conrad [place], MD;  Location: [facility];  Service: Vascular;  Laterality: Right;  . ARTERY REPAIR Right 02/17/2018   Procedure: BRACHIAL ARTERY EXPLORATION AND TRHOMBECTOMY;  Surgeon: Conrad [place], MD;  Location: [facility] OR;  Service: Vascular;  Laterality: Right;  . AV FISTULA PLACEMENT Left 05/09/2017   Procedure: INSERTION OF ARTERIOVENOUS (AV) GRAFT ARM (ARTEGRAFT);  Surgeon: Conrad Redgranite, MD;  Location: [facility] OR;  Service: Vascular;  Laterality: Left;  . AV FISTULA PLACEMENT Right 02/17/2018   Procedure: INSERTION OF ARTERIOVENOUS (AV) GORE-TEX GRAFT ARM RIGHT UPPER ARM;  Surgeon: Conrad [place], MD;  Location: [facility];  Service: Vascular;  Laterality: Right;  . AV FISTULA PLACEMENT Left 10/10/2019   Procedure: INSERTION  OF LEFT ARTERIOVENOUS (AV) ARTEGRAFT GRAFT ARM;  Surgeon: Waynetta Sandy, MD;  Location: [facility];  Service: Vascular;  Laterality: Left;  . AV FISTULA PLACEMENT Right 03/19/2020   Procedure: INSERTION OF ARTERIOVENOUS (AV) GORE-TEX GRAFT IN RIGHT ARM AND VIABAHN STENT IN RIGHT AXILLARY ARTERY;  Surgeon: Waynetta Sandy, MD;  Location: [facility];  Service: Vascular;  Laterality: Right;  . BASCILIC VEIN TRANSPOSITION Left 08/31/2016   Procedure:  BASILIC VEIN TRANSPOSITION FIRST STAGE;  Surgeon: Conrad Colwich, MD;  Location: [facility];  Service: Vascular;  Laterality: Left;  . CENTRAL VENOUS CATHETER INSERTION Left 07/24/2018   Procedure: INSERTION CENTRAL LINE ADULT;  Surgeon: Conrad Finzel, MD;  Location: [facility];  Service: Vascular;  Laterality: Left;  . EYE SURGERY     secondary to diabetic retinopathy   . FOOT SURGERY Right    t"ook bone out- maybe hammer toe"  . INSERTION OF DIALYSIS CATHETER Left 07/24/2018   Procedure: INSERTION OF TUNNELED DIALYSIS CATHETER;  Surgeon: Conrad [place], MD;  Location: [facility];  Service: Vascular;  Laterality: Left;  . IR FLUORO GUIDE CV LINE RIGHT  07/21/2018  . IR FLUORO GUIDE CV LINE RIGHT  06/01/2019  . IR FLUORO GUIDE CV LINE RIGHT  05/05/2020  . IR US GUIDE VASC ACCESS RIGHT  07/21/2018  . IR US GUIDE VASC ACCESS RIGHT  06/01/2019  . LIGATION ARTERIOVENOUS GORTEX GRAFT Right 03/20/2020   Procedure: LIGATION ARM ARTERIOVENOUS GORTEX GRAFT With Patch Angioplasty, Thrombectomy;  Surgeon: Waynetta Sandy, MD;  Location: [facility];  Service: Vascular;  Laterality: Right;  . LIGATION OF ARTERIOVENOUS  FISTULA Left 05/09/2017   Procedure: LIGATION OF ARTERIOVENOUS  FISTULA;  Surgeon: Conrad Curran, MD;  Location: [facility];  Service: Vascular;  Laterality: Left;  . LOOP RECORDER INSERTION N/A 12/05/2017   Procedure: LOOP RECORDER INSERTION;  Surgeon: Evans Lance, MD;  Location: [place] [facility];  Service: Cardiovascular;  Laterality: N/A;  . MYOMECTOMY    . REMOVAL OF GRAFT Right 02/17/2018   Procedure: REMOVAL OF RIGHT UPPER ARM ARTERIOVENOUS GRAFT;  Surgeon: Conrad Alleghany, MD;  Location: [facility];  Service: Vascular;  Laterality: Right;  . REVISION OF ARTERIOVENOUS GORETEX GRAFT Left 07/24/2018   Procedure: REDO ARTERIOVENOUS GORETEX GRAFT;  Surgeon: Conrad Jennings, MD;  Location: [facility];  Service: Vascular;  Laterality: Left;  . TEE WITHOUT CARDIOVERSION N/A 12/05/2017   Procedure: TRANSESOPHAGEAL ECHOCARDIOGRAM  (TEE);  Surgeon: Sanda Klein, MD;  Location: [facility] ENDOSCOPY;  Service: Cardiovascular;  Laterality: N/A;  . UPPER EXTREMITY VENOGRAPHY Left 07/13/2018   Procedure: UPPER EXTREMITY VENOGRAPHY - Central & Left Arm;  Surgeon: Conrad Dunklin, MD;  Location: [facility];  Service: Cardiovascular;  Laterality: Left;  . UPPER EXTREMITY VENOGRAPHY Bilateral 09/10/2019   Procedure: UPPER EXTREMITY VENOGRAPHY;  Surgeon: Waynetta Sandy, MD;  Location: [place] [facility];  Service: Cardiovascular;  Laterality: Bilateral;  . UTERINE FIBROID SURGERY    . VITRECTOMY Bilateral     There were no vitals filed for this visit.   Subjective Assessment - 06/18/20 1752    Subjective  Pt reports continued hand pain    Pertinent History 49 year old African-American woman with past medical history significant for type 2 diabetes mellitus, hypertension, dyslipidemia, gastroesophageal reflux disease, history of PE and prior history of CVA in 2018.  She last went to hemodialysis on 05/03/2020 and had about 44 minutes of dialysis out of her prescribed 4-hour treatment because of poor  catheter flows.  She underwent elective exchange of her right IJ TDC by interventional radiology that earlier required treatment with labetalol for significant hypertension and postprocedure complicated by development of PEA arrest with ROSC in <5 minutes after rapid CPR/ACLS maneuvers.  She was transiently intubated for airway protection due to transient change in mental status and pt was extubated after transfer to the ICU. 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 Yes    Pain Score 6     Pain Location Hand    Pain Orientation Right    Pain Descriptors / Indicators Aching    Pain  Type Chronic pain    Pain Onset More than a month ago    Pain Frequency Constant    Aggravating Factors  use    Pain Relieving Factors meds                          Treatment:Placing medium pegs into pegboard with RUE, min difficulty/ v.c      OT Treatment/Education - 06/19/20 1659    Education Details coordination, putty and beginning ROM HEP- see pt instructions, pt returned demonstration    Person(s) Educated Patient;Spouse    Methods Explanation;Demonstration;Verbal cues;Handout    Comprehension Verbalized understanding;Returned demonstration            OT Short Term Goals - 05/20/20 1622      OT SHORT TERM GOAL #1   Title I with inital HEP.    Time 6    Period Weeks    Status New    Target Date 07/04/20      OT SHORT TERM GOAL #2   Title Pt will be I with positioning to minimize risk for injury including splinting prn and sensory precautions.    Time 6    Period Weeks    Status New      OT SHORT TERM GOAL #3   Title Pt will perfrom bathing with min A and dressing with supervision    Time 6    Period Weeks    Status New      OT SHORT TERM GOAL #4   Title Pt will perform basic home managment/ cooking with min A    Time 6    Period Weeks    Status New      OT SHORT TERM GOAL #5   Title Pt will demonstrate ability to write a paragraph in a reasonable amount of time with 100% legibility    Time 6    Period Weeks    Status New      Additional Short Term Goals   Additional Short Term Goals Yes      OT SHORT TERM GOAL #6   Title Pt will demonstrate improved RUE functional use for ADLs as evidenced by performing 30 blocks or greater on box/ blocks    Baseline 24 blocks    Time 6    Period Weeks    Status New             OT Long Term Goals - 05/20/20 1615      OT LONG TERM GOAL #1   Title I with updated HEP.    Time 12  Period Weeks    Status New    Target Date 08/18/20      OT LONG TERM GOAL #2   Title Pt will resume use  of RUE as her dominant UE at least 75% of the time for ADLs/ IADLs.    Time 12    Period Weeks    Status New      OT LONG TERM GOAL #3   Title Pt will perfrom all basic ADLs modified independently    Time 12    Period Weeks    Status New      OT LONG TERM GOAL #4   Title Pt will perform basic home management and cooking modified independently.    Time 12    Period Weeks    Status New      OT LONG TERM GOAL #5   Title Pt will increase bilateral grip strength by 10 lbs for increased functional use during ADLs.    Time 12    Period Weeks    Status New      Long Term Additional Goals   Additional Long Term Goals Yes      OT LONG TERM GOAL #6   Title Pt will perform simulated work activities modified independently.    Time 12    Period Weeks    Status New                 Plan - 06/18/20 1822    Clinical Impression Statement Pt is progressing towards goals. She demonstrates significant gains in hand function since inital eval.    OT Occupational Profile and History Detailed Assessment- Review of Records and additional review of physical, cognitive, psychosocial history related to current functional performance    Occupational performance deficits (Please refer to evaluation for details): ADL's;IADL's;Work;Leisure;Social Participation    Body Structure / Function / Physical Skills ADL;Endurance;UE functional use;Flexibility;FMC;ROM;GMC;Coordination;Decreased knowledge of precautions;Sensation;IADL;Decreased knowledge of use of DME;Strength;Dexterity    Rehab Potential Good    Clinical Decision Making Limited treatment options, no task modification necessary    Comorbidities Affecting Occupational Performance: May have comorbidities impacting occupational performance    Modification or Assistance to Complete Evaluation  No modification of tasks or assist necessary to complete eval    OT Frequency 2x / week    OT Duration 12 weeks    OT Treatment/Interventions Self-care/ADL  training;Ultrasound;Neuromuscular education;Therapeutic exercise;Moist Heat;Manual Therapy;Therapeutic activities;Functional Mobility Training;Electrical Stimulation;Balance training;Passive range of motion;Paraffin;Patient/family education;DME and/or AE instruction;Cryotherapy;Fluidtherapy;Splinting    Plan ADL strategies for increased I due to RUE weakness, handwriting, functional use of RUE, address RUE shoulder ROM/ strength    OT Home Exercise Plan issued red putty and beginning HEP, see pt instructions    Consulted and Agree with Plan of Care Patient           Patient will benefit from skilled therapeutic intervention in order to improve the following deficits and impairments:   Body Structure / Function / Physical Skills: ADL, Endurance, UE functional use, Flexibility, FMC, ROM, GMC, Coordination, Decreased knowledge of precautions, Sensation, IADL, Decreased knowledge of use of DME, Strength, Dexterity       Visit Diagnosis: Other lack of coordination  Other disturbances of skin sensation  Muscle weakness (generalized)    Problem List Patient Active Problem List   Diagnosis Date Noted  . Positive ANA (antinuclear antibody) 04/16/2020  . ESRD on dialysis ([place])   . Hemiplegia and hemiparesis following cerebral infarction affecting left non-dominant side ([place]) 12/29/2018  . IBS (irritable  bowel syndrome) 08/06/2017  . Dyslipidemia associated with type 2 diabetes mellitus ([place]) 06/25/2016  . Proliferative diabetic retinopathy ([place]) 09/29/2015  . B12 deficiency 05/10/2014  . Anemia in chronic kidney disease 04/26/2014  . CKD (chronic kidney disease) stage 5, GFR less than 15 ml/min ([facility]) 04/26/2014  . Secondary hyperparathyroidism of renal origin ([place]) 11/27/2013  . Posterior subcapsular cataract, bilateral 10/18/2013  . Diabetic neuropathy, painful ([place]), on low dose Gabapentin 07/13/2013  . Diabetes mellitus, type II, insulin dependent ([place]), on CGM, with end organ damage:  renal, neuropathy, gastroparesis, retinopathy 02/19/2011  . Hypertension associated with diabetes ([place]) 02/19/2011  . Gastroparesis due to DM ([place]) 02/19/2011    Sande Pickert 06/19/2020, 5:02 PM  [facility] [address] [place], [place], [address] Phone: [phone]   Fax:  [phone]  Name: CATLYN SHIPTON MRN: [id number] Date of Birth: 06-24-1971

## 2020-06-18 NOTE — Patient Instructions (Signed)
  Flexor Tendon Gliding (Active Hook Fist)   With fingers and knuckles straight, bend middle and tip joints. Do not bend large knuckles. Repeat _10-15___ times. Do _4-6___ sessions per day.  MP Flexion (Active)   With back of hand on table, bend large knuckles as far as they will go, keeping small joints straight. Repeat _10-15___ times. Do __4-6__ sessions per day. Activity: Reach into a narrow container.*      Finger Flexion / Extension   With palm up, bend fingers of left hand toward palm, making a  fist. Straighten fingers, opening fist. Repeat sequence _10-15___ times per session. Do _4-6__ sessions per day. Hand Variation: Palm down   Copyright  VHI. All rights reserved.        AROM: Finger Flexion / Extension   Actively bend fingers of  hand. Start with knuckles furthest from palm, and slowly make a fist. Hold __5__ seconds. Relax. Then straighten fingers as far as possible. Repeat _10-15___ times per set.  Do _4-6___ sessions per day.  Copyright  VHI. All rights reserved.  AROM: Wrist Extension   .  With _right___ palm down, bend wrist up. Repeat __15__ times per set.  Do __2-3_ sessions per day.    AROM: Wrist Flexion       Copyright  VHI. All rights reserved.        1. Grip Strengthening (Resistive Putty)   Squeeze putty using thumb and all fingers. Repeat _20___ times. Do __2__ sessions per day.   2. Roll putty into tube on table and pinch between each finger and thumb x 10 reps each. (can do ring and small finger together)     Copyright  VHI. All rights reserved.    Coordination Activities  Perform the following activities for 20 minutes 1 times per day with right hand(s).   Flip cards 1 at a time   Deal cards with your thumb (Hold deck in hand and push card off top with thumb).  Pick up coins and stack.

## 2020-06-19 ENCOUNTER — Encounter: Payer: Self-pay | Admitting: Occupational Therapy

## 2020-06-23 ENCOUNTER — Encounter: Payer: Managed Care, Other (non HMO) | Admitting: Occupational Therapy

## 2020-06-25 ENCOUNTER — Ambulatory Visit: Payer: Managed Care, Other (non HMO) | Admitting: Occupational Therapy

## 2020-06-25 ENCOUNTER — Other Ambulatory Visit: Payer: Self-pay

## 2020-06-25 ENCOUNTER — Encounter: Payer: Self-pay | Admitting: Occupational Therapy

## 2020-06-25 DIAGNOSIS — R278 Other lack of coordination: Secondary | ICD-10-CM | POA: Diagnosis not present

## 2020-06-25 DIAGNOSIS — R208 Other disturbances of skin sensation: Secondary | ICD-10-CM

## 2020-06-25 DIAGNOSIS — M6281 Muscle weakness (generalized): Secondary | ICD-10-CM

## 2020-06-25 NOTE — Patient Instructions (Signed)
     Coordination Activities  Perform the following activities for 20 minutes 1 times per day with right hand(s).   Flip cards 1 at a time   Deal cards with your thumb (Hold deck in hand and push card off top with thumb).  Pick up coins and stack.  Rotate ball in fingertips (both directions)  Pick up coins until you get 5 in your hand/palm, then push coins from palm to fingertips to place in container or coin bank 1 at a time.

## 2020-06-25 NOTE — Therapy (Signed)
[facility] [address] [place], [place], [address] Phone: [phone]   Fax:  [phone]  Occupational Therapy Treatment  Patient Details  Name: Heather [name] MRN: [id number] Date of Birth: 10-May-1971 Referring Provider (OT): Dr. Donzetta Matters   Encounter Date: 06/25/2020   OT End of Session - 06/25/20 0725    Visit Number 3    Number of Visits 25    Date for OT Re-Evaluation 08/18/20    Authorization Type [employer]:  Covered 100%, VL-MN    OT Start Time 0721    OT Stop Time 0800    OT Time Calculation (min) 39 min    Activity Tolerance Patient tolerated treatment well    Behavior During Therapy [facility] for tasks assessed/performed           Past Medical History:  Diagnosis Date  . Antiplatelet or antithrombotic long-term use: Plavix 06/30/2017  . B12 deficiency 05/10/2014  . Blood transfusion without reported diagnosis   . Chronic constipation   . CVA (cerebral vascular accident) ([place]), nonhemorrhagic, inferior right cerebellum 12/03/2017   left side weakness in leg  . Cyst of left ovary 01/12/2018  . Diabetic neuropathy, painful ([place]), on low dose Gabapentin 07/13/2013  . DM (diabetes mellitus), type 2, uncontrolled, with renal complications ([place]) 62/69/4854  . DM2 (diabetes mellitus, type 2) ([place])   . Dysfunction of left eustachian tube, with pusatile tinnitus 11/24/2017  . Dyslipidemia associated with type 2 diabetes mellitus ([place]) 06/25/2016  . ESRD (end stage renal disease) on dialysis [facility])    On hemodialysis in July 2019 via [facility] then switched to [facility] in Nov 2019.   [name] ESRD with anemia ([place])   . Fibromyalgia   . Gastroparesis due to DM   . GERD (gastroesophageal reflux disease)   . Headache   . Hypertension associated with diabetes ([place]) 02/19/2011  . IBS (irritable bowel syndrome)   . Left-sided weakness 12/10/2017   .  [name] Leiomyoma of uterus   . Pancreatitis   . PE (pulmonary thromboembolism) ([place])  11/04/2019  . Pneumonia   . Proliferative diabetic retinopathy ([place]) 09/29/2015  . 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 Reactive depression 12/10/2017   .  [name] Right-sided low back pain without sciatica 12/08/2015  . Secondary hyperthyroidism 11/27/2013  . Steal syndrome dialysis vascular access ([place]) 02/17/2018  . Thromboembolism ([place]) 04/05/2018  . Upper airway cough syndrome, with recs to stay off ACE and take Pepcid q hs 11/15/2016    Past Surgical History:  Procedure Laterality Date  . A/V FISTULAGRAM Right 03/03/2020   Procedure: Venogram;  Surgeon: Waynetta Sandy, MD;  Location: [place] CV LAB;  Service: Cardiovascular;  Laterality: Right;  . ARTERY REPAIR Right 02/17/2018   Procedure: EXPLORATION OF RIGHT BRACHIAL ARTERY;  Surgeon: Conrad [place], MD;  Location: [facility];  Service: Vascular;  Laterality: Right;  . ARTERY REPAIR Right 02/17/2018   Procedure: BRACHIAL ARTERY EXPLORATION AND TRHOMBECTOMY;  Surgeon: Conrad McNab, MD;  Location: [facility] OR;  Service: Vascular;  Laterality: Right;  . AV FISTULA PLACEMENT Left 05/09/2017   Procedure: INSERTION OF ARTERIOVENOUS (AV) GRAFT ARM (ARTEGRAFT);  Surgeon: Conrad Middleway, MD;  Location: [facility] OR;  Service: Vascular;  Laterality: Left;  . AV FISTULA PLACEMENT Right 02/17/2018   Procedure: INSERTION OF ARTERIOVENOUS (AV) GORE-TEX GRAFT ARM RIGHT UPPER ARM;  Surgeon: Conrad Oxford, MD;  Location: [place];  Service: Vascular;  Laterality: Right;  . AV FISTULA PLACEMENT Left 10/10/2019  Procedure: INSERTION OF LEFT ARTERIOVENOUS (AV) ARTEGRAFT GRAFT ARM;  Surgeon: Waynetta Sandy, MD;  Location: [facility];  Service: Vascular;  Laterality: Left;  . AV FISTULA PLACEMENT Right 03/19/2020   Procedure: INSERTION OF ARTERIOVENOUS (AV) GORE-TEX GRAFT IN RIGHT ARM AND VIABAHN STENT IN RIGHT AXILLARY ARTERY;  Surgeon: Waynetta Sandy, MD;  Location: [place] [facility];  Service: Vascular;  Laterality: Right;  . BASCILIC VEIN TRANSPOSITION  Left 08/31/2016   Procedure: BASILIC VEIN TRANSPOSITION FIRST STAGE;  Surgeon: Conrad Lodge Pole, MD;  Location: [facility];  Service: Vascular;  Laterality: Left;  . CENTRAL VENOUS CATHETER INSERTION Left 07/24/2018   Procedure: INSERTION CENTRAL LINE ADULT;  Surgeon: Conrad Shar[place], MD;  Location: [place];  Service: Vascular;  Laterality: Left;  . EYE SURGERY     secondary to diabetic retinopathy   . FOOT SURGERY Right    t"ook bone out- maybe hammer toe"  . INSERTION OF DIALYSIS CATHETER Left 07/24/2018   Procedure: INSERTION OF TUNNELED DIALYSIS CATHETER;  Surgeon: Conrad Woodacre, MD;  Location: [place];  Service: Vascular;  Laterality: Left;  . IR FLUORO GUIDE CV LINE RIGHT  07/21/2018  . IR FLUORO GUIDE CV LINE RIGHT  06/01/2019  . IR FLUORO GUIDE CV LINE RIGHT  05/05/2020  . IR US GUIDE VASC ACCESS RIGHT  07/21/2018  . IR US GUIDE VASC ACCESS RIGHT  06/01/2019  . LIGATION ARTERIOVENOUS GORTEX GRAFT Right 03/20/2020   Procedure: LIGATION ARM ARTERIOVENOUS GORTEX GRAFT With Patch Angioplasty, Thrombectomy;  Surgeon: Waynetta Sandy, MD;  Location: [place];  Service: Vascular;  Laterality: Right;  . LIGATION OF ARTERIOVENOUS  FISTULA Left 05/09/2017   Procedure: LIGATION OF ARTERIOVENOUS  FISTULA;  Surgeon: Conrad Teller, MD;  Location: [place];  Service: Vascular;  Laterality: Left;  . LOOP RECORDER INSERTION N/A 12/05/2017   Procedure: LOOP RECORDER INSERTION;  Surgeon: Evans Lance, MD;  Location: [place] CV LAB;  Service: Cardiovascular;  Laterality: N/A;  . MYOMECTOMY    . REMOVAL OF GRAFT Right 02/17/2018   Procedure: REMOVAL OF RIGHT UPPER ARM ARTERIOVENOUS GRAFT;  Surgeon: Conrad Lisbon, MD;  Location: [facility];  Service: Vascular;  Laterality: Right;  . REVISION OF ARTERIOVENOUS GORETEX GRAFT Left 07/24/2018   Procedure: REDO ARTERIOVENOUS GORETEX GRAFT;  Surgeon: Conrad , MD;  Location: [facility];  Service: Vascular;  Laterality: Left;  . TEE WITHOUT CARDIOVERSION N/A 12/05/2017   Procedure:  TRANSESOPHAGEAL ECHOCARDIOGRAM (TEE);  Surgeon: Sanda Klein, MD;  Location: [facility] ENDOSCOPY;  Service: Cardiovascular;  Laterality: N/A;  . UPPER EXTREMITY VENOGRAPHY Left 07/13/2018   Procedure: UPPER EXTREMITY VENOGRAPHY - Central & Left Arm;  Surgeon: Conrad , MD;  Location: [place] CV LAB;  Service: Cardiovascular;  Laterality: Left;  . UPPER EXTREMITY VENOGRAPHY Bilateral 09/10/2019   Procedure: UPPER EXTREMITY VENOGRAPHY;  Surgeon: Waynetta Sandy, MD;  Location: [place] CV LAB;  Service: Cardiovascular;  Laterality: Bilateral;  . UTERINE FIBROID SURGERY    . VITRECTOMY Bilateral     There were no vitals filed for this visit.   Subjective Assessment - 06/25/20 0723    Subjective  Pt reports that hand pain is better.  Pt reports that she is typing and using mouse with R hand and eating with R hand now.  Pt continues to report difficulty with 2nd digit.    Pertinent History 49 year old African-American woman with past medical history significant for type 2 diabetes mellitus, hypertension, dyslipidemia, gastroesophageal reflux disease, history of  PE and prior history of CVA in 2018.  She last went to hemodialysis on 05/03/2020 and had about 44 minutes of dialysis out of her prescribed 4-hour treatment because of poor catheter flows.  She underwent elective exchange of her right IJ TDC by interventional radiology that earlier required treatment with labetalol for significant hypertension and postprocedure complicated by development of PEA arrest with ROSC in <5 minutes after rapid CPR/ACLS maneuvers.  She was transiently intubated for airway protection due to transient change in mental status and pt was extubated after transfer to the ICU. 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 Yes    Pain Location Hand    Pain Orientation Right    Pain Descriptors / Indicators Aching;Tingling    Pain Type Chronic pain    Pain Onset More than a month ago    Pain Frequency Constant    Aggravating Factors  dialysis    Pain Relieving Factors meds              Placing/removing clothespins with 1-4lb resist on vertical pole for functional reaching and incr pinch strength (tip pinch for yellow, 3point for red, and lateral for green).  Practiced writing:  Pt able to write name/address legibility using tan foam grip (pt has one to practice at home).  Picking up checkers with R hand to place in connect 4 slots for improved 2point pinch with min difficulty, then stacking checkers with min difficulty.   Placing small pegs in pegboard with R hand with min-mod difficulty (min v.c. to use 2nd digit), improved with repetition.  Then removing with in-hand manipulation with mod difficulty translating pegs and with pinch.    Issued 2 red foam pieces (and instructed in use) for pt to try on utensils/toothbrush for incr use of 2-3rd digits as pt reports placing utensils/toothbrush between 3-4th digits.         OT Education - 06/25/20 0758    Education Details Additions/updates to coordination HEP--see pt instructions    Person(s) Educated Patient;Spouse    Methods Explanation;Demonstration;Verbal cues;Handout    Comprehension Verbalized understanding;Returned demonstration            OT Short Term Goals - 05/20/20 1622      OT SHORT TERM GOAL #1   Title I with inital HEP.    Time 6    Period Weeks    Status New    Target Date 07/04/20      OT SHORT TERM GOAL #2   Title Pt will be I with positioning to minimize risk for injury including splinting prn and sensory precautions.    Time 6    Period Weeks    Status New      OT SHORT TERM GOAL #3   Title Pt will perfrom bathing with min A and dressing with supervision     Time 6    Period Weeks    Status New      OT SHORT TERM GOAL #4   Title Pt will perform basic home managment/ cooking with min A    Time 6    Period Weeks    Status New      OT SHORT TERM GOAL #5   Title Pt  will demonstrate ability to write a paragraph in a reasonable amount of time with 100% legibility    Time 6    Period Weeks    Status New      Additional Short Term Goals   Additional Short Term Goals Yes      OT SHORT TERM GOAL #6   Title Pt will demonstrate improved RUE functional use for ADLs as evidenced by performing 30 blocks or greater on box/ blocks    Baseline 24 blocks    Time 6    Period Weeks    Status New             OT Long Term Goals - 05/20/20 1615      OT LONG TERM GOAL #1   Title I with updated HEP.    Time 12    Period Weeks    Status New    Target Date 08/18/20      OT LONG TERM GOAL #2   Title Pt will resume use of RUE as her dominant UE at least 75% of the time for ADLs/ IADLs.    Time 12    Period Weeks    Status New      OT LONG TERM GOAL #3   Title Pt will perfrom all basic ADLs modified independently    Time 12    Period Weeks    Status New      OT LONG TERM GOAL #4   Title Pt will perform basic home management and cooking modified independently.    Time 12    Period Weeks    Status New      OT LONG TERM GOAL #5   Title Pt will increase bilateral grip strength by 10 lbs for increased functional use during ADLs.    Time 12    Period Weeks    Status New      Long Term Additional Goals   Additional Long Term Goals Yes      OT LONG TERM GOAL #6   Title Pt will perform simulated work activities modified independently.    Time 12    Period Weeks    Status New                 Plan - 06/25/20 0725    Clinical Impression Statement Pt is progressing towards goals with improving R hand functional use and coordination.  Pt reports continued difficulty using 2nd digit, but demo improved 2point pinch with repetition and  min cueing for incr use.    OT Occupational Profile and History --    Occupational performance deficits (Please refer to evaluation for details): ADL's;IADL's;Work;Leisure;Social Participation    Body Structure / Function / Physical Skills ADL;Endurance;UE functional use;Flexibility;FMC;ROM;GMC;Coordination;Decreased knowledge of precautions;Sensation;IADL;Decreased knowledge of use of DME;Strength;Dexterity    Rehab Potential Good    Clinical Decision Making Limited treatment options, no task modification necessary    Comorbidities Affecting Occupational Performance: May have comorbidities impacting occupational performance    Modification or Assistance to Complete Evaluation  No modification of tasks or assist necessary to complete eval    OT Frequency 2x / week    OT Duration 12 weeks    OT Treatment/Interventions Self-care/ADL training;Ultrasound;Neuromuscular education;Therapeutic exercise;Moist Heat;Manual Therapy;Therapeutic activities;Functional Mobility Training;Electrical Stimulation;Balance training;Passive range of motion;Paraffin;Patient/family education;DME and/or AE instruction;Cryotherapy;Fluidtherapy;Splinting    Plan ADL strategies for increased I,  functional use of RUE, address RUE shoulder ROM/ strength    OT Home Exercise Plan issued red putty and beginning  HEP, see pt instructions    Consulted and Agree with Plan of Care Patient           Patient will benefit from skilled therapeutic intervention in order to improve the following deficits and impairments:   Body Structure / Function / Physical Skills: ADL, Endurance, UE functional use, Flexibility, FMC, ROM, GMC, Coordination, Decreased knowledge of precautions, Sensation, IADL, Decreased knowledge of use of DME, Strength, Dexterity       Visit Diagnosis: Other lack of coordination  Other disturbances of skin sensation  Muscle weakness (generalized)    Problem List Patient Active Problem List   Diagnosis  Date Noted  . Positive ANA (antinuclear antibody) 04/16/2020  . ESRD on dialysis ([place])   . Hemiplegia and hemiparesis following cerebral infarction affecting left non-dominant side ([place]) 12/29/2018  . IBS (irritable bowel syndrome) 08/06/2017  . Dyslipidemia associated with type 2 diabetes mellitus ([place]) 06/25/2016  . Proliferative diabetic retinopathy ([place]) 09/29/2015  . B12 deficiency 05/10/2014  . Anemia in chronic kidney disease 04/26/2014  . CKD (chronic kidney disease) stage 5, GFR less than 15 ml/min ([facility]) 04/26/2014  . Secondary hyperparathyroidism of renal origin ([place]) 11/27/2013  . Posterior subcapsular cataract, bilateral 10/18/2013  . Diabetic neuropathy, painful ([place]), on low dose Gabapentin 07/13/2013  . Diabetes mellitus, type II, insulin dependent ([place]), on CGM, with end organ damage: renal, neuropathy, gastroparesis, retinopathy 02/19/2011  . Hypertension associated with diabetes ([place]) 02/19/2011  . Gastroparesis due to DM [facility]) 02/19/2011    [facility] 06/25/2020, 11:30 AM  [facility] [address] [place], [place], [address] Phone: [phone]   Fax:  [phone]  Name: Heather [name] MRN: [id number] Date of Birth: [birth year]   Vianne Bulls, OTR/L [facility] [address] [place], [place]  [address] [phone] phone [phone] 06/25/20 11:30 AM

## 2020-06-27 ENCOUNTER — Ambulatory Visit: Payer: Managed Care, Other (non HMO) | Admitting: Occupational Therapy

## 2020-07-02 ENCOUNTER — Ambulatory Visit: Payer: Managed Care, Other (non HMO) | Admitting: Occupational Therapy

## 2020-07-04 ENCOUNTER — Telehealth: Payer: Self-pay | Admitting: Occupational Therapy

## 2020-07-04 ENCOUNTER — Ambulatory Visit: Payer: Managed Care, Other (non HMO) | Attending: Family Medicine | Admitting: Occupational Therapy

## 2020-07-04 ENCOUNTER — Other Ambulatory Visit: Payer: Self-pay

## 2020-07-04 ENCOUNTER — Encounter: Payer: Self-pay | Admitting: Occupational Therapy

## 2020-07-04 DIAGNOSIS — M6281 Muscle weakness (generalized): Secondary | ICD-10-CM | POA: Insufficient documentation

## 2020-07-04 DIAGNOSIS — R2681 Unsteadiness on feet: Secondary | ICD-10-CM | POA: Insufficient documentation

## 2020-07-04 DIAGNOSIS — R209 Unspecified disturbances of skin sensation: Secondary | ICD-10-CM | POA: Insufficient documentation

## 2020-07-04 DIAGNOSIS — R208 Other disturbances of skin sensation: Secondary | ICD-10-CM

## 2020-07-04 DIAGNOSIS — R278 Other lack of coordination: Secondary | ICD-10-CM | POA: Diagnosis not present

## 2020-07-04 DIAGNOSIS — R2689 Other abnormalities of gait and mobility: Secondary | ICD-10-CM | POA: Insufficient documentation

## 2020-07-04 NOTE — Telephone Encounter (Signed)
[place] with PT referral

## 2020-07-04 NOTE — Telephone Encounter (Signed)
Dr. Jerline Pain, Heather [name] is receiving OT to address ADLs, and RUE weakness. She demonstrates balance and gait deficits and can benefit from physical therapy. If you agree please place a referral for PT.  Sincerely, Theone Murdoch, OTR/L

## 2020-07-04 NOTE — Telephone Encounter (Signed)
Please place order for PT evaluation in Epic referral cue.  Thanks! Curt Bears Demon Volante, OTR/L

## 2020-07-04 NOTE — Therapy (Signed)
[facility] [address] [place], [place], [address] Phone: [phone]   Fax:  [phone]  Occupational Therapy Treatment  Patient Details  Name: Heather [name] MRN: [id number] Date of Birth: 01-Dec-1971 Referring Provider (OT): Dr. Donzetta Matters   Encounter Date: 07/04/2020   OT End of Session - 07/04/20 0730    Visit Number 4    Number of Visits 25    Date for OT Re-Evaluation 08/18/20    Authorization Type [employer]:  Covered 100%, VL-MN    OT Start Time 0718    OT Stop Time 0758    OT Time Calculation (min) 40 min    Activity Tolerance Patient tolerated treatment well    Behavior During Therapy [facility] for tasks assessed/performed           Past Medical History:  Diagnosis Date  . Antiplatelet or antithrombotic long-term use: Plavix 06/30/2017  . B12 deficiency 05/10/2014  . Blood transfusion without reported diagnosis   . Chronic constipation   . CVA (cerebral vascular accident) ([place]), nonhemorrhagic, inferior right cerebellum 12/03/2017   left side weakness in leg  . Cyst of left ovary 01/12/2018  . Diabetic neuropathy, painful ([place]), on low dose Gabapentin 07/13/2013  . DM (diabetes mellitus), type 2, uncontrolled, with renal complications ([place]) 55/97/4163  . DM2 (diabetes mellitus, type 2) ([place])   . Dysfunction of left eustachian tube, with pusatile tinnitus 11/24/2017  . Dyslipidemia associated with type 2 diabetes mellitus ([place]) 06/25/2016  . ESRD (end stage renal disease) on dialysis [facility])    On hemodialysis in July 2019 via [facility] then switched to [facility] in Nov 2019.   [name] ESRD with anemia ([place])   . Fibromyalgia   . Gastroparesis due to DM   . GERD (gastroesophageal reflux disease)   . Headache   . Hypertension associated with diabetes ([place]) 02/19/2011  . IBS (irritable bowel syndrome)   . Left-sided weakness 12/10/2017   .  [name] Leiomyoma of uterus   . Pancreatitis   . PE (pulmonary thromboembolism) ([place])  11/04/2019  . Pneumonia   . Proliferative diabetic retinopathy ([place]) 09/29/2015  . 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 Reactive depression 12/10/2017   .  [name] Right-sided low back pain without sciatica 12/08/2015  . Secondary hyperthyroidism 11/27/2013  . Steal syndrome dialysis vascular access ([place]) 02/17/2018  . Thromboembolism ([place]) 04/05/2018  . Upper airway cough syndrome, with recs to stay off ACE and take Pepcid q hs 11/15/2016    Past Surgical History:  Procedure Laterality Date  . A/V FISTULAGRAM Right 03/03/2020   Procedure: Venogram;  Surgeon: Waynetta Sandy, MD;  Location: [facility];  Service: Cardiovascular;  Laterality: Right;  . ARTERY REPAIR Right 02/17/2018   Procedure: EXPLORATION OF RIGHT BRACHIAL ARTERY;  Surgeon: Conrad [place], MD;  Location: [facility];  Service: Vascular;  Laterality: Right;  . ARTERY REPAIR Right 02/17/2018   Procedure: BRACHIAL ARTERY EXPLORATION AND TRHOMBECTOMY;  Surgeon: Conrad Trona, MD;  Location: [facility] OR;  Service: Vascular;  Laterality: Right;  . AV FISTULA PLACEMENT Left 05/09/2017   Procedure: INSERTION OF ARTERIOVENOUS (AV) GRAFT ARM (ARTEGRAFT);  Surgeon: Conrad [place], MD;  Location: [facility] OR;  Service: Vascular;  Laterality: Left;  . AV FISTULA PLACEMENT Right 02/17/2018   Procedure: INSERTION OF ARTERIOVENOUS (AV) GORE-TEX GRAFT ARM RIGHT UPPER ARM;  Surgeon: Conrad [place], MD;  Location: [facility];  Service: Vascular;  Laterality: Right;  . AV FISTULA PLACEMENT Left 10/10/2019  Procedure: INSERTION OF LEFT ARTERIOVENOUS (AV) ARTEGRAFT GRAFT ARM;  Surgeon: Waynetta Sandy, MD;  Location: [facility];  Service: Vascular;  Laterality: Left;  . AV FISTULA PLACEMENT Right 03/19/2020   Procedure: INSERTION OF ARTERIOVENOUS (AV) GORE-TEX GRAFT IN RIGHT ARM AND VIABAHN STENT IN RIGHT AXILLARY ARTERY;  Surgeon: Waynetta Sandy, MD;  Location: [facility];  Service: Vascular;  Laterality: Right;  . BASCILIC VEIN TRANSPOSITION  Left 08/31/2016   Procedure: BASILIC VEIN TRANSPOSITION FIRST STAGE;  Surgeon: Conrad [place], MD;  Location: [facility];  Service: Vascular;  Laterality: Left;  . CENTRAL VENOUS CATHETER INSERTION Left 07/24/2018   Procedure: INSERTION CENTRAL LINE ADULT;  Surgeon: Conrad [place], MD;  Location: [facility];  Service: Vascular;  Laterality: Left;  . EYE SURGERY     secondary to diabetic retinopathy   . FOOT SURGERY Right    t"ook bone out- maybe hammer toe"  . INSERTION OF DIALYSIS CATHETER Left 07/24/2018   Procedure: INSERTION OF TUNNELED DIALYSIS CATHETER;  Surgeon: Conrad [place]hall, MD;  Location: [facility];  Service: Vascular;  Laterality: Left;  . IR FLUORO GUIDE CV LINE RIGHT  07/21/2018  . IR FLUORO GUIDE CV LINE RIGHT  06/01/2019  . IR FLUORO GUIDE CV LINE RIGHT  05/05/2020  . IR US GUIDE VASC ACCESS RIGHT  07/21/2018  . IR US GUIDE VASC ACCESS RIGHT  06/01/2019  . LIGATION ARTERIOVENOUS GORTEX GRAFT Right 03/20/2020   Procedure: LIGATION ARM ARTERIOVENOUS GORTEX GRAFT With Patch Angioplasty, Thrombectomy;  Surgeon: Waynetta Sandy, MD;  Location: [facility];  Service: Vascular;  Laterality: Right;  . LIGATION OF ARTERIOVENOUS  FISTULA Left 05/09/2017   Procedure: LIGATION OF ARTERIOVENOUS  FISTULA;  Surgeon: Conrad [place], MD;  Location: [facility];  Service: Vascular;  Laterality: Left;  . LOOP RECORDER INSERTION N/A 12/05/2017   Procedure: LOOP RECORDER INSERTION;  Surgeon: Evans Lance, MD;  Location: [facility];  Service: Cardiovascular;  Laterality: N/A;  . MYOMECTOMY    . REMOVAL OF GRAFT Right 02/17/2018   Procedure: REMOVAL OF RIGHT UPPER ARM ARTERIOVENOUS GRAFT;  Surgeon: Conrad [place], MD;  Location: [facility];  Service: Vascular;  Laterality: Right;  . REVISION OF ARTERIOVENOUS GORETEX GRAFT Left 07/24/2018   Procedure: REDO ARTERIOVENOUS GORETEX GRAFT;  Surgeon: Conrad Marmet, MD;  Location: [facility];  Service: Vascular;  Laterality: Left;  . TEE WITHOUT CARDIOVERSION N/A 12/05/2017   Procedure:  TRANSESOPHAGEAL ECHOCARDIOGRAM (TEE);  Surgeon: Sanda Klein, MD;  Location[facility] ENDOSCOPY;  Service: Cardiovascular;  Laterality: N/A;  . UPPER EXTREMITY VENOGRAPHY Left 07/13/2018   Procedure: UPPER EXTREMITY VENOGRAPHY - Central & Left Arm;  Surgeon: Conrad , MD;  Location: [facility];  Service: Cardiovascular;  Laterality: Left;  . UPPER EXTREMITY VENOGRAPHY Bilateral 09/10/2019   Procedure: UPPER EXTREMITY VENOGRAPHY;  Surgeon: Waynetta Sandy, MD;  Location: [facility];  Service: Cardiovascular;  Laterality: Bilateral;  . UTERINE FIBROID SURGERY    . VITRECTOMY Bilateral     There were no vitals filed for this visit.   Subjective Assessment - 07/04/20 0730    Subjective  Denies pain    Currently in Pain? No/denies                       Treatment: Fluidotherapy x 10 mins to RUE for stiffness. No adverse reactions. Pt reports decreased stiffness after use. Writing activity with foam grip, pt is now  able to  write a full paragraph with 100% legibility in a reasonable amount of time. Reviewed previously issued coordination HEP, pt returned demonstration.. Therapist checked short term goals. Pt demonstrates good progress. Placing graded clothespins red and yellow on vertical antennae, in standing, 1 LOB, but pt recovered. Green clothespins were too resistive.. Pt to bring in putty next visit as pt reports not using consistently.             OT Short Term Goals - 07/04/20 0755      OT SHORT TERM GOAL #1   Title I with inital HEP.    Time 6    Period Weeks    Status Achieved    Target Date 07/04/20      OT SHORT TERM GOAL #2   Title Pt will be I with positioning to minimize risk for injury including splinting prn and sensory precautions.    Time 6    Period Weeks    Status On-going   splinting deferred, pt does not need, needs reinforcement of sensory prec.     OT SHORT TERM GOAL #3   Title Pt will perfrom bathing with min  A and dressing with supervision    Time 6    Period Weeks    Status Achieved      OT SHORT TERM GOAL #4   Title Pt will perform basic home managment/ cooking with min A    Time 6    Period Weeks    Status On-going   has not cooked but has been performing some light home mangement     OT SHORT TERM GOAL #5   Title Pt will demonstrate ability to write a paragraph in a reasonable amount of time with 100% legibility    Time 6    Period Weeks    Status Achieved      OT SHORT TERM GOAL #6   Title Pt will demonstrate improved RUE functional use for ADLs as evidenced by performing 30 blocks or greater on box/ blocks    Baseline 24 blocks    Time 6    Period Weeks    Status Achieved   45            OT Long Term Goals - 05/20/20 1615      OT LONG TERM GOAL #1   Title I with updated HEP.    Time 12    Period Weeks    Status New    Target Date 08/18/20      OT LONG TERM GOAL #2   Title Pt will resume use of RUE as her dominant UE at least 75% of the time for ADLs/ IADLs.    Time 12    Period Weeks    Status New      OT LONG TERM GOAL #3   Title Pt will perfrom all basic ADLs modified independently    Time 12    Period Weeks    Status New      OT LONG TERM GOAL #4   Title Pt will perform basic home management and cooking modified independently.    Time 12    Period Weeks    Status New      OT LONG TERM GOAL #5   Title Pt will increase bilateral grip strength by 10 lbs for increased functional use during ADLs.    Time 12    Period Weeks    Status New      Long Term Additional Goals  Additional Long Term Goals Yes      OT LONG TERM GOAL #6   Title Pt will perform simulated work activities modified independently.    Time 12    Period Weeks    Status New                 Plan - 07/04/20 0807    Clinical Impression Statement Pt is progressing towards goals with good overall progress to short term goals. therapist checked short tem goals today.     Occupational performance deficits (Please refer to evaluation for details): ADL's;IADL's;Work;Leisure;Social Participation    Body Structure / Function / Physical Skills ADL;Endurance;UE functional use;Flexibility;FMC;ROM;GMC;Coordination;Decreased knowledge of precautions;Sensation;IADL;Decreased knowledge of use of DME;Strength;Dexterity    Rehab Potential Good    Clinical Decision Making Limited treatment options, no task modification necessary    Comorbidities Affecting Occupational Performance: May have comorbidities impacting occupational performance    Modification or Assistance to Complete Evaluation  No modification of tasks or assist necessary to complete eval    OT Frequency 2x / week    OT Duration 12 weeks    OT Treatment/Interventions Self-care/ADL training;Ultrasound;Neuromuscular education;Therapeutic exercise;Moist Heat;Manual Therapy;Therapeutic activities;Functional Mobility Training;Electrical Stimulation;Balance training;Passive range of motion;Paraffin;Patient/family education;DME and/or AE instruction;Cryotherapy;Fluidtherapy;Splinting    Plan check to see if PT order placed, ADL strategies/ light home mangement to target balance,  functional use of RUE, address RUE shoulder ROM/ strength    OT Home Exercise Plan issued red putty and coordination HEP,    Consulted and Agree with Plan of Care Patient           Patient will benefit from skilled therapeutic intervention in order to improve the following deficits and impairments:   Body Structure / Function / Physical Skills: ADL, Endurance, UE functional use, Flexibility, FMC, ROM, GMC, Coordination, Decreased knowledge of precautions, Sensation, IADL, Decreased knowledge of use of DME, Strength, Dexterity       Visit Diagnosis: Other lack of coordination  Other disturbances of skin sensation  Muscle weakness (generalized)    Problem List Patient Active Problem List   Diagnosis Date Noted  . Positive ANA  (antinuclear antibody) 04/16/2020  . ESRD on dialysis ([place])   . Hemiplegia and hemiparesis following cerebral infarction affecting left non-dominant side ([place]) 12/29/2018  . IBS (irritable bowel syndrome) 08/06/2017  . Dyslipidemia associated with type 2 diabetes mellitus ([place]) 06/25/2016  . Proliferative diabetic retinopathy ([place]) 09/29/2015  . B12 deficiency 05/10/2014  . Anemia in chronic kidney disease 04/26/2014  . CKD (chronic kidney disease) stage 5, GFR less than 15 ml/min ([facility]) 04/26/2014  . Secondary hyperparathyroidism of renal origin ([place]) 11/27/2013  . Posterior subcapsular cataract, bilateral 10/18/2013  . Diabetic neuropathy, painful ([place]), on low dose Gabapentin 07/13/2013  . Diabetes mellitus, type II, insulin dependent ([place]), on CGM, with end organ damage: renal, neuropathy, gastroparesis, retinopathy 02/19/2011  . Hypertension associated with diabetes ([place]) 02/19/2011  . Gastroparesis due to DM ([place]) 02/19/2011    Heather [name] 07/04/2020, 8:59 AM  [facility] [address] [place], [place], [address] Phone: [phone]   Fax:  [phone]  Name: Heather [name] MRN: [id number] Date of Birth: July 30, 1971

## 2020-07-04 NOTE — Telephone Encounter (Signed)
Created in error

## 2020-07-07 ENCOUNTER — Other Ambulatory Visit: Payer: Self-pay

## 2020-07-07 ENCOUNTER — Ambulatory Visit: Payer: Managed Care, Other (non HMO) | Admitting: Occupational Therapy

## 2020-07-07 DIAGNOSIS — M6281 Muscle weakness (generalized): Secondary | ICD-10-CM

## 2020-07-07 DIAGNOSIS — R208 Other disturbances of skin sensation: Secondary | ICD-10-CM

## 2020-07-07 DIAGNOSIS — R278 Other lack of coordination: Secondary | ICD-10-CM

## 2020-07-07 NOTE — Therapy (Signed)
[facility] [address] [place], [place], [address] Phone: [phone]   Fax:  [phone]  Occupational Therapy Treatment  Patient Details  Name: Heather [name] MRN: [id number] Date of Birth: Sep 07, 1971 Referring Provider (OT): Dr. Donzetta Matters   Encounter Date: 07/07/2020   OT End of Session - 07/07/20 1825    Visit Number 5    Number of Visits 25    Date for OT Re-Evaluation 08/18/20    Authorization Type Cigna:  Covered 100%, VL-MN    OT Start Time 1745    OT Stop Time 1815    OT Time Calculation (min) 30 min    Activity Tolerance Patient tolerated treatment well    Behavior During Therapy [facility] for tasks assessed/performed           Past Medical History:  Diagnosis Date  . Antiplatelet or antithrombotic long-term use: Plavix 06/30/2017  . B12 deficiency 05/10/2014  . Blood transfusion without reported diagnosis   . Chronic constipation   . CVA (cerebral vascular accident) ([place]), nonhemorrhagic, inferior right cerebellum 12/03/2017   left side weakness in leg  . Cyst of left ovary 01/12/2018  . Diabetic neuropathy, painful ([place]), on low dose Gabapentin 07/13/2013  . DM (diabetes mellitus), type 2, uncontrolled, with renal complications ([place]) 65/02/5464  . DM2 (diabetes mellitus, type 2) ([place])   . Dysfunction of left eustachian tube, with pusatile tinnitus 11/24/2017  . Dyslipidemia associated with type 2 diabetes mellitus ([place]) 06/25/2016  . ESRD (end stage renal disease) on dialysis [facility])    On hemodialysis in July 2019 via [facility] then switched to [facility] in Nov 2019.   [name] ESRD with anemia ([place])   . Fibromyalgia   . Gastroparesis due to DM   . GERD (gastroesophageal reflux disease)   . Headache   . Hypertension associated with diabetes ([place]) 02/19/2011  . IBS (irritable bowel syndrome)   . Left-sided weakness 12/10/2017   .  [name] Leiomyoma of uterus   . Pancreatitis   . PE (pulmonary thromboembolism) ([place])  11/04/2019  . Pneumonia   . Proliferative diabetic retinopathy ([place]) 09/29/2015  . 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 Reactive depression 12/10/2017   .  [name] Right-sided low back pain without sciatica 12/08/2015  . Secondary hyperthyroidism 11/27/2013  . Steal syndrome dialysis vascular access ([place]) 02/17/2018  . Thromboembolism ([place]) 04/05/2018  . Upper airway cough syndrome, with recs to stay off ACE and take Pepcid q hs 11/15/2016    Past Surgical History:  Procedure Laterality Date  . A/V FISTULAGRAM Right 03/03/2020   Procedure: Venogram;  Surgeon: Waynetta Sandy, MD;  Location: [place] [facility];  Service: Cardiovascular;  Laterality: Right;  . ARTERY REPAIR Right 02/17/2018   Procedure: EXPLORATION OF RIGHT BRACHIAL ARTERY;  Surgeon: Conrad Campus, MD;  Location: [place];  Service: Vascular;  Laterality: Right;  . ARTERY REPAIR Right 02/17/2018   Procedure: BRACHIAL ARTERY EXPLORATION AND TRHOMBECTOMY;  Surgeon: Conrad [place], MD;  Location: [facility] OR;  Service: Vascular;  Laterality: Right;  . AV FISTULA PLACEMENT Left 05/09/2017   Procedure: INSERTION OF ARTERIOVENOUS (AV) GRAFT ARM (ARTEGRAFT);  Surgeon: Conrad [place], MD;  Location: [facility] OR;  Service: Vascular;  Laterality: Left;  . AV FISTULA PLACEMENT Right 02/17/2018   Procedure: INSERTION OF ARTERIOVENOUS (AV) GORE-TEX GRAFT ARM RIGHT UPPER ARM;  Surgeon: Conrad [place], MD;  Location: [place];  Service: Vascular;  Laterality: Right;  . AV FISTULA PLACEMENT Left 10/10/2019  Procedure: INSERTION OF LEFT ARTERIOVENOUS (AV) ARTEGRAFT GRAFT ARM;  Surgeon: Waynetta Sandy, MD;  Location: [place];  Service: Vascular;  Laterality: Left;  . AV FISTULA PLACEMENT Right 03/19/2020   Procedure: INSERTION OF ARTERIOVENOUS (AV) GORE-TEX GRAFT IN RIGHT ARM AND VIABAHN STENT IN RIGHT AXILLARY ARTERY;  Surgeon: Waynetta Sandy, MD;  Location: [place];  Service: Vascular;  Laterality: Right;  . BASCILIC VEIN TRANSPOSITION  Left 08/31/2016   Procedure: BASILIC VEIN TRANSPOSITION FIRST STAGE;  Surgeon: Conrad Montross, MD;  Location: [place];  Service: Vascular;  Laterality: Left;  . CENTRAL VENOUS CATHETER INSERTION Left 07/24/2018   Procedure: INSERTION CENTRAL LINE ADULT;  Surgeon: Conrad Union, MD;  Location: [facility];  Service: Vascular;  Laterality: Left;  . EYE SURGERY     secondary to diabetic retinopathy   . FOOT SURGERY Right    t"ook bone out- maybe hammer toe"  . INSERTION OF DIALYSIS CATHETER Left 07/24/2018   Procedure: INSERTION OF TUNNELED DIALYSIS CATHETER;  Surgeon: Conrad Hector, MD;  Location: [facility];  Service: Vascular;  Laterality: Left;  . IR FLUORO GUIDE CV LINE RIGHT  07/21/2018  . IR FLUORO GUIDE CV LINE RIGHT  06/01/2019  . IR FLUORO GUIDE CV LINE RIGHT  05/05/2020  . IR US GUIDE VASC ACCESS RIGHT  07/21/2018  . IR US GUIDE VASC ACCESS RIGHT  06/01/2019  . LIGATION ARTERIOVENOUS GORTEX GRAFT Right 03/20/2020   Procedure: LIGATION ARM ARTERIOVENOUS GORTEX GRAFT With Patch Angioplasty, Thrombectomy;  Surgeon: Waynetta Sandy, MD;  Location: [place];  Service: Vascular;  Laterality: Right;  . LIGATION OF ARTERIOVENOUS  FISTULA Left 05/09/2017   Procedure: LIGATION OF ARTERIOVENOUS  FISTULA;  Surgeon: Conrad [place], MD;  Location: [place];  Service: Vascular;  Laterality: Left;  . LOOP RECORDER INSERTION N/A 12/05/2017   Procedure: LOOP RECORDER INSERTION;  Surgeon: Evans Lance, MD;  Location: [place] [facility];  Service: Cardiovascular;  Laterality: N/A;  . MYOMECTOMY    . REMOVAL OF GRAFT Right 02/17/2018   Procedure: REMOVAL OF RIGHT UPPER ARM ARTERIOVENOUS GRAFT;  Surgeon: Conrad [place], MD;  Location: [place];  Service: Vascular;  Laterality: Right;  . REVISION OF ARTERIOVENOUS GORETEX GRAFT Left 07/24/2018   Procedure: REDO ARTERIOVENOUS GORETEX GRAFT;  Surgeon: Conrad [place], MD;  Location: [place];  Service: Vascular;  Laterality: Left;  . TEE WITHOUT CARDIOVERSION N/A 12/05/2017   Procedure:  TRANSESOPHAGEAL ECHOCARDIOGRAM (TEE);  Surgeon: Sanda Klein, MD;  Location: [facility] ENDOSCOPY;  Service: Cardiovascular;  Laterality: N/A;  . UPPER EXTREMITY VENOGRAPHY Left 07/13/2018   Procedure: UPPER EXTREMITY VENOGRAPHY - Central & Left Arm;  Surgeon: Conrad , MD;  Location: [place] [facility];  Service: Cardiovascular;  Laterality: Left;  . UPPER EXTREMITY VENOGRAPHY Bilateral 09/10/2019   Procedure: UPPER EXTREMITY VENOGRAPHY;  Surgeon: Waynetta Sandy, MD;  Location: [place] [facility];  Service: Cardiovascular;  Laterality: Bilateral;  . UTERINE FIBROID SURGERY    . VITRECTOMY Bilateral     There were no vitals filed for this visit.   Subjective Assessment - 07/07/20 1757    Subjective  I have no pain    Currently in Pain? No/denies              [facility] OT Assessment - 07/07/20 0001      Hand Function   Right Hand Grip (lbs) 18.4  OT Treatments/Exercises (OP) - 07/07/20 0001      ADLs   Home Maintenance Patient has returned to doing the dishes as prior to surgery.  She did not cook or do laundry, her husband completes those tasks.      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      ADL Comments Patient now has Physical Therapy referral, and does not feel she has adequate time to do both OT and PT.  [name] now using RUE as dominant for ADL/IADL as prior to surgery, and really wants emphasis to be on LE strengthening and balance - as this would help her get on the list for organ donation at [facility] met most of her OT goals, and feels she can continue to progress on her own.  She wants her time now spent in Physical Therapy.                    OT Education - 07/07/20 1825    Education Details Reviewed reamining goals, and per patient request - discharging OT    Person(s) Educated Patient    Methods Explanation    Comprehension Verbalized understanding            OT  Short Term Goals - 07/07/20 1800      OT SHORT TERM GOAL #4   Title Pt will perform basic home managment/ cooking with min A             OT Long Term Goals - 07/07/20 1829      OT LONG TERM GOAL #1   Title I with updated HEP.    Status Achieved      OT LONG TERM GOAL #2   Title Pt will resume use of RUE as her dominant UE at least 75% of the time for ADLs/ IADLs.    Status Achieved      OT LONG TERM GOAL #3   Title Pt will perfrom all basic ADLs modified independently    Status Achieved      OT LONG TERM GOAL #4   Title Pt will perform basic home management and cooking modified independently.    Status Achieved      OT LONG TERM GOAL #5   Title Pt will increase bilateral grip strength by 10 lbs for increased functional use during ADLs.    Status Not Met      OT LONG TERM GOAL #6   Title Pt will perform simulated work activities modified independently.    Status Achieved                 Plan - 07/07/20 1826    Clinical Impression Statement Patient is very pleased with the functional return of her right hand.  She continues to report decreased strength in thumb and first digit, but reports using hand as dominant for ADL/IADL all the time.  [name] thrilled with her progress and hopes to convert her OT visits to PT to address LE strength and mobility to hopefully put her onto the waiting list for a organ transplant.  Patient has limited time in her schedule as she is currently on dialysis, and she works full time.  Patient wanting to discharge from OT early - to pursue PT.  Most OT Long term goals have been met.    OT Frequency 2x / week    OT Duration 12 weeks    OT Treatment/Interventions Self-care/ADL  training;Ultrasound;Neuromuscular education;Therapeutic exercise;Moist Heat;Manual Therapy;Therapeutic activities;Functional Mobility Training;Electrical Stimulation;Balance training;Passive range of motion;Paraffin;Patient/family education;DME and/or AE  instruction;Cryotherapy;Fluidtherapy;Splinting    Plan d/c    OT Home Exercise Plan issued red putty and coordination HEP,    Consulted and Agree with Plan of Care Patient           Patient will benefit from skilled therapeutic intervention in order to improve the following deficits and impairments:           Visit Diagnosis: Other lack of coordination  Other disturbances of skin sensation  Muscle weakness (generalized)    Problem List Patient Active Problem List   Diagnosis Date Noted  . Positive ANA (antinuclear antibody) 04/16/2020  . ESRD on dialysis ([place])   . Hemiplegia and hemiparesis following cerebral infarction affecting left non-dominant side ([place]) 12/29/2018  . IBS (irritable bowel syndrome) 08/06/2017  . Dyslipidemia associated with type 2 diabetes mellitus ([place]) 06/25/2016  . Proliferative diabetic retinopathy ([place]) 09/29/2015  . B12 deficiency 05/10/2014  . Anemia in chronic kidney disease 04/26/2014  . CKD (chronic kidney disease) stage 5, GFR less than 15 ml/min ([facility]) 04/26/2014  . Secondary hyperparathyroidism of renal origin ([place]) 11/27/2013  . Posterior subcapsular cataract, bilateral 10/18/2013  . Diabetic neuropathy, painful ([place]), on low dose Gabapentin 07/13/2013  . Diabetes mellitus, type II, insulin dependent ([place]), on CGM, with end organ damage: renal, neuropathy, gastroparesis, retinopathy 02/19/2011  . Hypertension associated with diabetes ([place]) 02/19/2011  . Gastroparesis due to DM [facility]) 02/19/2011    Heather [name], OTR/L 07/07/2020, 6:30 PM  [name] [address] [place], [place], [address] Phone: [phone]   Fax:  [phone]  Name: Heather [name] MRN: [id number] Date of Birth: 1971-04-16

## 2020-07-07 NOTE — Therapy (Signed)
[facility] [address] [place], [place], [address] Phone: [phone]   Fax:  [phone]  Occupational Therapy Treatment  Patient Details  Name: Heather [name] MRN: [id number] Date of Birth: Sep 07, 1971 Referring Provider (OT): Dr. Donzetta Matters   Encounter Date: 07/07/2020   OT End of Session - 07/07/20 1825    Visit Number 5    Number of Visits 25    Date for OT Re-Evaluation 08/18/20    Authorization Type Cign[employer]:  Covered 100%, VL-MN    OT Start Time 1745    OT Stop Time 1815    OT Time Calculation (min) 30 min    Activity Tolerance Patient tolerated treatment well    Behavior During Therapy [facility] for tasks assessed/performed           Past Medical History:  Diagnosis Date  . Antiplatelet or antithrombotic long-term use: Plavix 06/30/2017  . B12 deficiency 05/10/2014  . Blood transfusion without reported diagnosis   . Chronic constipation   . CVA (cerebral vascular accident) ([place]), nonhemorrhagic, inferior right cerebellum 12/03/2017   left side weakness in leg  . Cyst of left ovary 01/12/2018  . Diabetic neuropathy, painful ([place]), on low dose Gabapentin 07/13/2013  . DM (diabetes mellitus), type 2, uncontrolled, with renal complications ([place]) 65/02/5464  . DM2 (diabetes mellitus, type 2) ([place])   . Dysfunction of left eustachian tube, with pusatile tinnitus 11/24/2017  . Dyslipidemia associated with type 2 diabetes mellitus ([place]) 06/25/2016  . ESRD (end stage renal disease) on dialysis [facility])    On hemodialysis in July 2019 via [facility] then switched to [facility] in Nov 2019.   [name] ESRD with anemia ([place])   . Fibromyalgia   . Gastroparesis due to DM   . GERD (gastroesophageal reflux disease)   . Headache   . Hypertension associated with diabetes ([place]) 02/19/2011  . IBS (irritable bowel syndrome)   . Left-sided weakness 12/10/2017   .  [name] Leiomyoma of uterus   . Pancreatitis   . PE (pulmonary thromboembolism) ([place])  11/04/2019  . Pneumonia   . Proliferative diabetic retinopathy ([place]) 09/29/2015  . 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 Reactive depression 12/10/2017   .  [name] Right-sided low back pain without sciatica 12/08/2015  . Secondary hyperthyroidism 11/27/2013  . Steal syndrome dialysis vascular access ([place]) 02/17/2018  . Thromboembolism ([place]) 04/05/2018  . Upper airway cough syndrome, with recs to stay off ACE and take Pepcid q hs 11/15/2016    Past Surgical History:  Procedure Laterality Date  . A/V FISTULAGRAM Right 03/03/2020   Procedure: Venogram;  Surgeon: Waynetta Sandy, MD;  Location: [place] [facility];  Service: Cardiovascular;  Laterality: Right;  . ARTERY REPAIR Right 02/17/2018   Procedure: EXPLORATION OF RIGHT BRACHIAL ARTERY;  Surgeon: Conrad [place], MD;  Location: [place];  Service: Vascular;  Laterality: Right;  . ARTERY REPAIR Right 02/17/2018   Procedure: BRACHIAL ARTERY EXPLORATION AND TRHOMBECTOMY;  Surgeon: Conrad [place], MD;  Location: [facility] OR;  Service: Vascular;  Laterality: Right;  . AV FISTULA PLACEMENT Left 05/09/2017   Procedure: INSERTION OF ARTERIOVENOUS (AV) GRAFT ARM (ARTEGRAFT);  Surgeon: Conrad [place], MD;  Location: [facility] OR;  Service: Vascular;  Laterality: Left;  . AV FISTULA PLACEMENT Right 02/17/2018   Procedure: INSERTION OF ARTERIOVENOUS (AV) GORE-TEX GRAFT ARM RIGHT UPPER ARM;  Surgeon: Conrad [place], MD;  Location: [place];  Service: Vascular;  Laterality: Right;  . AV FISTULA PLACEMENT Left 10/10/2019  Procedure: INSERTION OF LEFT ARTERIOVENOUS (AV) ARTEGRAFT GRAFT ARM;  Surgeon: Waynetta Sandy, MD;  Location: [place];  Service: Vascular;  Laterality: Left;  . AV FISTULA PLACEMENT Right 03/19/2020   Procedure: INSERTION OF ARTERIOVENOUS (AV) GORE-TEX GRAFT IN RIGHT ARM AND VIABAHN STENT IN RIGHT AXILLARY ARTERY;  Surgeon: Waynetta Sandy, MD;  Location: [place];  Service: Vascular;  Laterality: Right;  . BASCILIC VEIN TRANSPOSITION  Left 08/31/2016   Procedure: BASILIC VEIN TRANSPOSITION FIRST STAGE;  Surgeon: Conrad [place], MD;  Location: [place];  Service: Vascular;  Laterality: Left;  . CENTRAL VENOUS CATHETER INSERTION Left 07/24/2018   Procedure: INSERTION CENTRAL LINE ADULT;  Surgeon: Conrad [place], MD;  Location: [place];  Service: Vascular;  Laterality: Left;  . EYE SURGERY     secondary to diabetic retinopathy   . FOOT SURGERY Right    t"ook bone out- maybe hammer toe"  . INSERTION OF DIALYSIS CATHETER Left 07/24/2018   Procedure: INSERTION OF TUNNELED DIALYSIS CATHETER;  Surgeon: Conrad [place], MD;  Location: [place];  Service: Vascular;  Laterality: Left;  . IR FLUORO GUIDE CV LINE RIGHT  07/21/2018  . IR FLUORO GUIDE CV LINE RIGHT  06/01/2019  . IR FLUORO GUIDE CV LINE RIGHT  05/05/2020  . IR US GUIDE VASC ACCESS RIGHT  07/21/2018  . IR US GUIDE VASC ACCESS RIGHT  06/01/2019  . LIGATION ARTERIOVENOUS GORTEX GRAFT Right 03/20/2020   Procedure: LIGATION ARM ARTERIOVENOUS GORTEX GRAFT With Patch Angioplasty, Thrombectomy;  Surgeon: Waynetta Sandy, MD;  Location: [place];  Service: Vascular;  Laterality: Right;  . LIGATION OF ARTERIOVENOUS  FISTULA Left 05/09/2017   Procedure: LIGATION OF ARTERIOVENOUS  FISTULA;  Surgeon: Conrad [place], MD;  Location: [place];  Service: Vascular;  Laterality: Left;  . LOOP RECORDER INSERTION N/A 12/05/2017   Procedure: LOOP RECORDER INSERTION;  Surgeon: Evans Lance, MD;  Location: [place] [facility];  Service: Cardiovascular;  Laterality: N/A;  . MYOMECTOMY    . REMOVAL OF GRAFT Right 02/17/2018   Procedure: REMOVAL OF RIGHT UPPER ARM ARTERIOVENOUS GRAFT;  Surgeon: Conrad [place], MD;  Location: [place];  Service: Vascular;  Laterality: Right;  . REVISION OF ARTERIOVENOUS GORETEX GRAFT Left 07/24/2018   Procedure: REDO ARTERIOVENOUS GORETEX GRAFT;  Surgeon: Conrad [place], MD;  Location: [facility];  Service: Vascular;  Laterality: Left;  . TEE WITHOUT CARDIOVERSION N/A 12/05/2017   Procedure:  TRANSESOPHAGEAL ECHOCARDIOGRAM (TEE);  Surgeon: Sanda Klein, MD;  Location: [facility] ENDOSCOPY;  Service: Cardiovascular;  Laterality: N/A;  . UPPER EXTREMITY VENOGRAPHY Left 07/13/2018   Procedure: UPPER EXTREMITY VENOGRAPHY - Central & Left Arm;  Surgeon: Conrad , MD;  Location: [place] [facility];  Service: Cardiovascular;  Laterality: Left;  . UPPER EXTREMITY VENOGRAPHY Bilateral 09/10/2019   Procedure: UPPER EXTREMITY VENOGRAPHY;  Surgeon: Waynetta Sandy, MD;  Location: [place] [facility];  Service: Cardiovascular;  Laterality: Bilateral;  . UTERINE FIBROID SURGERY    . VITRECTOMY Bilateral     There were no vitals filed for this visit.   Subjective Assessment - 07/07/20 1757    Subjective  I have no pain    Currently in Pain? No/denies              [facility] OT Assessment - 07/07/20 0001      Hand Function   Right Hand Grip (lbs) 18.4  OT Treatments/Exercises (OP) - 07/07/20 0001      ADLs   Home Maintenance Patient has returned to doing the dishes as prior to surgery.  She did not cook or do laundry, her husband completes those tasks.      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      ADL Comments Patient now has Physical Therapy referral, and does not feel she has adequate time to do both OT and PT.  [name] now using RUE as dominant for ADL/IADL as prior to surgery, and really wants emphasis to be on LE strengthening and balance - as this would help her get on the list for organ donation at [facility] met most of her OT goals, and feels she can continue to progress on her own.  She wants her time now spent in Physical Therapy.                    OT Education - 07/07/20 1825    Education Details Reviewed reamining goals, and per patient request - discharging OT    Person(s) Educated Patient    Methods Explanation    Comprehension Verbalized understanding            OT  Short Term Goals - 07/07/20 1800      OT SHORT TERM GOAL #4   Title Pt will perform basic home managment/ cooking with min A             OT Long Term Goals - 07/07/20 1829      OT LONG TERM GOAL #1   Title I with updated HEP.    Status Achieved      OT LONG TERM GOAL #2   Title Pt will resume use of RUE as her dominant UE at least 75% of the time for ADLs/ IADLs.    Status Achieved      OT LONG TERM GOAL #3   Title Pt will perfrom all basic ADLs modified independently    Status Achieved      OT LONG TERM GOAL #4   Title Pt will perform basic home management and cooking modified independently.    Status Achieved      OT LONG TERM GOAL #5   Title Pt will increase bilateral grip strength by 10 lbs for increased functional use during ADLs.    Status Not Met      OT LONG TERM GOAL #6   Title Pt will perform simulated work activities modified independently.    Status Achieved                 Plan - 07/07/20 1826    Clinical Impression Statement Patient is very pleased with the functional return of her right hand.  She continues to report decreased strength in thumb and first digit, but reports using hand as dominant for ADL/IADL all the time.  [name] thrilled with her progress and hopes to convert her OT visits to PT to address LE strength and mobility to hopefully put her onto the waiting list for a organ transplant.  Patient has limited time in her schedule as she is currently on dialysis, and she works full time.  Patient wanting to discharge from OT early - to pursue PT.  Most OT Long term goals have been met.    OT Frequency 2x / week    OT Duration 12 weeks    OT Treatment/Interventions Self-care/ADL  training;Ultrasound;Neuromuscular education;Therapeutic exercise;Moist Heat;Manual Therapy;Therapeutic activities;Functional Mobility Training;Electrical Stimulation;Balance training;Passive range of motion;Paraffin;Patient/family education;DME and/or AE  instruction;Cryotherapy;Fluidtherapy;Splinting    Plan d/c    OT Home Exercise Plan issued red putty and coordination HEP,    Consulted and Agree with Plan of Care Patient           Patient will benefit from skilled therapeutic intervention in order to improve the following deficits and impairments:           Visit Diagnosis: Other lack of coordination  Other disturbances of skin sensation  Muscle weakness (generalized)    Problem List Patient Active Problem List   Diagnosis Date Noted  . Positive ANA (antinuclear antibody) 04/16/2020  . ESRD on dialysis ([place])   . Hemiplegia and hemiparesis following cerebral infarction affecting left non-dominant side ([place]) 12/29/2018  . IBS (irritable bowel syndrome) 08/06/2017  . Dyslipidemia associated with type 2 diabetes mellitus ([place]) 06/25/2016  . Proliferative diabetic retinopathy ([place]) 09/29/2015  . B12 deficiency 05/10/2014  . Anemia in chronic kidney disease 04/26/2014  . CKD (chronic kidney disease) stage 5, GFR less than 15 ml/min ([facility]) 04/26/2014  . Secondary hyperparathyroidism of renal origin ([place]) 11/27/2013  . Posterior subcapsular cataract, bilateral 10/18/2013  . Diabetic neuropathy, painful ([place]), on low dose Gabapentin 07/13/2013  . Diabetes mellitus, type II, insulin dependent ([place]), on CGM, with end organ damage: renal, neuropathy, gastroparesis, retinopathy 02/19/2011  . Hypertension associated with diabetes ([place]) 02/19/2011  . Gastroparesis due to DM ([place]) 02/19/2011   OCCUPATIONAL THERAPY DISCHARGE SUMMARY  Visits from Start of Care: 5  Current functional level related to goals / functional outcomes: Patient is using RUE as dominant hand for ADL/IADL due to decreased stiffness, and improved strength   Remaining deficits: Decreased grip strength  Education / Equipment: HEP Plan: Patient agrees to discharge.  Patient goals were partially met. Patient is being discharged due to meeting the stated rehab  goals.  ?????     Heather [name] , OTR/L 07/07/2020, 6:31 PM  [facility] [address] [place], [place], [address] Phone: [phone]   Fax:  [phone]  Name: Heather [name] MRN: [id number] Date of Birth: 06-04-1971

## 2020-07-07 NOTE — Telephone Encounter (Signed)
Referral Placed 

## 2020-07-09 ENCOUNTER — Ambulatory Visit: Payer: Managed Care, Other (non HMO) | Admitting: Occupational Therapy

## 2020-07-11 ENCOUNTER — Other Ambulatory Visit: Payer: Self-pay | Admitting: Family Medicine

## 2020-07-14 ENCOUNTER — Encounter: Payer: Managed Care, Other (non HMO) | Admitting: Occupational Therapy

## 2020-07-14 ENCOUNTER — Other Ambulatory Visit: Payer: Self-pay

## 2020-07-14 ENCOUNTER — Ambulatory Visit: Payer: Managed Care, Other (non HMO)

## 2020-07-14 VITALS — BP 138/88 | HR 94

## 2020-07-14 DIAGNOSIS — R2689 Other abnormalities of gait and mobility: Secondary | ICD-10-CM

## 2020-07-14 DIAGNOSIS — R2681 Unsteadiness on feet: Secondary | ICD-10-CM

## 2020-07-14 DIAGNOSIS — M6281 Muscle weakness (generalized): Secondary | ICD-10-CM

## 2020-07-14 DIAGNOSIS — R278 Other lack of coordination: Secondary | ICD-10-CM | POA: Diagnosis not present

## 2020-07-14 NOTE — Therapy (Signed)
[place] [address] [place], [place], [address] Phone: [phone]   Fax:  [phone]  Physical Therapy Evaluation  Patient Details  Name: Heather [name] MRN: [id number] Date of Birth: 07-18-1971 Referring Provider (PT): Dimas Chyle   Encounter Date: 07/14/2020   PT End of Session - 07/14/20 0717    Visit Number 1    Number of Visits 17    Date for PT Re-Evaluation 96/28/36   90 day cert, 60 day poc   PT Start Time 0715    PT Stop Time 0758    PT Time Calculation (min) 43 min    Equipment Utilized During Treatment Gait belt    Activity Tolerance Patient tolerated treatment well    Behavior During Therapy [facility] for tasks assessed/performed           Past Medical History:  Diagnosis Date  . Antiplatelet or antithrombotic long-term use: Plavix 06/30/2017  . B12 deficiency 05/10/2014  . Blood transfusion without reported diagnosis   . Chronic constipation   . CVA (cerebral vascular accident) ([place]), nonhemorrhagic, inferior right cerebellum 12/03/2017   left side weakness in leg  . Cyst of left ovary 01/12/2018  . Diabetic neuropathy, painful ([place]), on low dose Gabapentin 07/13/2013  . DM (diabetes mellitus), type 2, uncontrolled, with renal complications ([place]) 62/94/7654  . DM2 (diabetes mellitus, type 2) ([place])   . Dysfunction of left eustachian tube, with pusatile tinnitus 11/24/2017  . Dyslipidemia associated with type 2 diabetes mellitus ([place]) 06/25/2016  . ESRD (end stage renal disease) on dialysis [facility])    On hemodialysis in July 2019 via [facility] then switched to [facility] in Nov 2019.   [name] ESRD with anemia ([place])   . Fibromyalgia   . Gastroparesis due to DM   . GERD (gastroesophageal reflux disease)   . Headache   . Hypertension associated with diabetes ([place]) 02/19/2011  . IBS (irritable bowel syndrome)   . Left-sided weakness 12/10/2017   .  [name] Leiomyoma of uterus   . Pancreatitis   . PE (pulmonary  thromboembolism) ([place]) 11/04/2019  . Pneumonia   . Proliferative diabetic retinopathy ([place]) 09/29/2015  . 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 Reactive depression 12/10/2017   .  [name] Right-sided low back pain without sciatica 12/08/2015  . Secondary hyperthyroidism 11/27/2013  . Steal syndrome dialysis vascular access ([place]) 02/17/2018  . Thromboembolism ([place]) 04/05/2018  . Upper airway cough syndrome, with recs to stay off ACE and take Pepcid q hs 11/15/2016    Past Surgical History:  Procedure Laterality Date  . A/V FISTULAGRAM Right 03/03/2020   Procedure: Venogram;  Surgeon: Waynetta Sandy, MD;  Location: [place] [facility];  Service: Cardiovascular;  Laterality: Right;  . ARTERY REPAIR Right 02/17/2018   Procedure: EXPLORATION OF RIGHT BRACHIAL ARTERY;  Surgeon: Conrad Bartolo, MD;  Location: [place];  Service: Vascular;  Laterality: Right;  . ARTERY REPAIR Right 02/17/2018   Procedure: BRACHIAL ARTERY EXPLORATION AND TRHOMBECTOMY;  Surgeon: Conrad Ocean, MD;  Location: [facility] OR;  Service: Vascular;  Laterality: Right;  . AV FISTULA PLACEMENT Left 05/09/2017   Procedure: INSERTION OF ARTERIOVENOUS (AV) GRAFT ARM (ARTEGRAFT);  Surgeon: Conrad Upland, MD;  Location: [facility] OR;  Service: Vascular;  Laterality: Left;  . AV FISTULA PLACEMENT Right 02/17/2018   Procedure: INSERTION OF ARTERIOVENOUS (AV) GORE-TEX GRAFT ARM RIGHT UPPER ARM;  Surgeon: Conrad [place] Caldwell, MD;  Location: [place];  Service: Vascular;  Laterality: Right;  .  AV FISTULA PLACEMENT Left 10/10/2019   Procedure: INSERTION OF LEFT ARTERIOVENOUS (AV) ARTEGRAFT GRAFT ARM;  Surgeon: Waynetta Sandy, MD;  Location: [place];  Service: Vascular;  Laterality: Left;  . AV FISTULA PLACEMENT Right 03/19/2020   Procedure: INSERTION OF ARTERIOVENOUS (AV) GORE-TEX GRAFT IN RIGHT ARM AND VIABAHN STENT IN RIGHT AXILLARY ARTERY;  Surgeon: Waynetta Sandy, MD;  Location: [place];  Service: Vascular;  Laterality: Right;  .  BASCILIC VEIN TRANSPOSITION Left 08/31/2016   Procedure: BASILIC VEIN TRANSPOSITION FIRST STAGE;  Surgeon: Conrad White Cloud, MD;  Location: [place];  Service: Vascular;  Laterality: Left;  . CENTRAL VENOUS CATHETER INSERTION Left 07/24/2018   Procedure: INSERTION CENTRAL LINE ADULT;  Surgeon: Conrad Smithfield, MD;  Location: [place];  Service: Vascular;  Laterality: Left;  . EYE SURGERY     secondary to diabetic retinopathy   . FOOT SURGERY Right    t"ook bone out- maybe hammer toe"  . INSERTION OF DIALYSIS CATHETER Left 07/24/2018   Procedure: INSERTION OF TUNNELED DIALYSIS CATHETER;  Surgeon: Conrad Reno, MD;  Location: [place];  Service: Vascular;  Laterality: Left;  . IR FLUORO GUIDE CV LINE RIGHT  07/21/2018  . IR FLUORO GUIDE CV LINE RIGHT  06/01/2019  . IR FLUORO GUIDE CV LINE RIGHT  05/05/2020  . IR US GUIDE VASC ACCESS RIGHT  07/21/2018  . IR US GUIDE VASC ACCESS RIGHT  06/01/2019  . LIGATION ARTERIOVENOUS GORTEX GRAFT Right 03/20/2020   Procedure: LIGATION ARM ARTERIOVENOUS GORTEX GRAFT With Patch Angioplasty, Thrombectomy;  Surgeon: Waynetta Sandy, MD;  Location: [place];  Service: Vascular;  Laterality: Right;  . LIGATION OF ARTERIOVENOUS  FISTULA Left 05/09/2017   Procedure: LIGATION OF ARTERIOVENOUS  FISTULA;  Surgeon: Conrad Troup, MD;  Location: [place];  Service: Vascular;  Laterality: Left;  . LOOP RECORDER INSERTION N/A 12/05/2017   Procedure: LOOP RECORDER INSERTION;  Surgeon: Evans Lance, MD;  Location: [place] [facility];  Service: Cardiovascular;  Laterality: N/A;  . MYOMECTOMY    . REMOVAL OF GRAFT Right 02/17/2018   Procedure: REMOVAL OF RIGHT UPPER ARM ARTERIOVENOUS GRAFT;  Surgeon: Conrad Lone Wolf, MD;  Location: [place];  Service: Vascular;  Laterality: Right;  . REVISION OF ARTERIOVENOUS GORETEX GRAFT Left 07/24/2018   Procedure: REDO ARTERIOVENOUS GORETEX GRAFT;  Surgeon: Conrad Orwigsburg, MD;  Location: [facility] [place];  Service: Vascular;  Laterality: Left;  . TEE WITHOUT CARDIOVERSION N/A  12/05/2017   Procedure: TRANSESOPHAGEAL ECHOCARDIOGRAM (TEE);  Surgeon: Sanda Klein, MD;  Location: [facility] ENDOSCOPY;  Service: Cardiovascular;  Laterality: N/A;  . UPPER EXTREMITY VENOGRAPHY Left 07/13/2018   Procedure: UPPER EXTREMITY VENOGRAPHY - Central & Left Arm;  Surgeon: Conrad Prairie Ridge, MD;  Location: [place] [facility];  Service: Cardiovascular;  Laterality: Left;  . UPPER EXTREMITY VENOGRAPHY Bilateral 09/10/2019   Procedure: UPPER EXTREMITY VENOGRAPHY;  Surgeon: Waynetta Sandy, MD;  Location: [place] [facility];  Service: Cardiovascular;  Laterality: Bilateral;  . UTERINE FIBROID SURGERY    . VITRECTOMY Bilateral     Vitals:   07/14/20 0717  BP: 138/88  Pulse: 94      Subjective Assessment - 07/14/20 0717    Subjective Pt was hospitalized 5/10-5/12. 49 year old female with an extensive past medical history including but not limited to prior CVA in 2018, type 2 diabetes, dyslipidemia, end-stage renal disease on hemodialysis, GERD, hypertension who presented for elective exchange of right tunneled HD catheter by IR.  PCCM was called  to interventional radiology for CODE BLUE.  It was reported patient was significantly hypertensive during case at which time she received labetalol after which patient was seen in witnessed PEA arrest. Pt has dialysis Tuesday/Thursday/Saturday. Has been on for last 2 years. Pt reports that she has just been having more weakness and balance issues. Wants to pursue kidney transplant and they want her off walker to get on the waitlist.    Pertinent History CVA in 2018, type 2 diabetes, dyslipidemia, end-stage renal disease on hemodialysis, GERD, hypertension    Patient Stated Goals To get off walker so she can get on transplant list.    Currently in Pain? Yes    Pain Score 5     Pain Location Back    Pain Orientation Lower    Pain Descriptors / Indicators --   just throbbing   Pain Onset More than a month ago    Pain Frequency Constant                [facility] PT Assessment - 07/14/20 0721      Assessment   Medical Diagnosis muscle weakness    Referring Provider (PT) Dimas Chyle    Onset Date/Surgical Date 05/05/20    Hand Dominance Right    Prior Therapy OT for arm recently but just discharged. PT briefly to pass a test back in April.       Precautions   Precautions Fall    Precaution Comments BP left arm only      Balance Screen   Has the patient fallen in the past 6 months No    Has the patient had a decrease in activity level because of a fear of falling?  Yes    Is the patient reluctant to leave their home because of a fear of falling?  Yes      [place] residence    Living Arrangements Spouse/significant other    Available Help at Discharge Family   alone during the day as husband works   Type of [place] to enter    [employer] of Steps 2    [place] level    [place] - 4 wheels;[place] - 2 wheels;[place]andra Knudsen - single point      Prior Function   Level of Independence Independent    Vocation Full time employment    Vocation Requirements [employer]. Works remotely from home on [place] [employer], roller skating- used to be fun to do. Puzzles      Cognition   Overall Cognitive Status Within Functional Limits for tasks assessed      Observation/Other Assessments   Observations chest port right chest      Sensation   Light Touch Impaired by gross assessment    Additional Comments Pt has no light touch in feet and some stocking glove distribution, numbness in first 2 digits right hand from surgery      Coordination   Gross Motor Movements are Fluid and Coordinated Yes    Fine Motor Movements are Fluid and Coordinated Yes      ROM / Strength   AROM / PROM / Strength Strength      Strength   Strength Assessment Site Shoulder;Elbow;Hand;Hip;Knee;Ankle    Right/Left Shoulder  Right;Left    Right Shoulder Flexion 4/5    Left Shoulder Flexion 4/5    Right/Left Elbow  Right;Left    Right Elbow Flexion 4/5    Right Elbow Extension 4/5    Left Elbow Flexion 4/5    Left Elbow Extension 4/5    Right/Left hand Right;Left    Right Hand Gross Grasp Impaired    Left Hand Gross Grasp Functional    Right/Left Hip Right;Left    Right Hip Flexion 3+/5    Right Hip ABduction 3+/5    Left Hip Flexion 3+/5    Left Hip ABduction 2+/5    Right/Left Knee Right;Left    Right Knee Flexion 4/5    Right Knee Extension 4+/5    Left Knee Flexion 4/5    Left Knee Extension 4+/5    Right/Left Ankle Right;Left    Right Ankle Dorsiflexion 2-/5    Right Ankle Plantar Flexion 2+/5    Left Ankle Dorsiflexion 2-/5    Left Ankle Plantar Flexion 2+/5      Bed Mobility   Bed Mobility Rolling Right;Rolling Left;Supine to Sit;Sit to Supine    Rolling Right Independent    Rolling Left Independent    Supine to Sit Independent    Sit to Supine Independent      Transfers   Transfers Sit to Stand;Stand to Sit    Sit to Stand 5: Supervision    Sit to Stand Details Verbal cues for technique    Sit to Stand Details (indicate cue type and reason) Pt tends to brace legs on chair. Verbal cues not to do this.    Five time sit to stand comments  23.67 sec from low mat with hands    Stand to Sit 5: Supervision      Ambulation/Gait   Ambulation/Gait Yes    Ambulation/Gait Assistance 5: Supervision    Ambulation/Gait Assistance Details Pt has decreased DF bilateral but more significant foot drop left with quick foot flat.    Ambulation Distance (Feet) 50 Feet    Assistive device Rollator    Gait Pattern Step-through pattern;Decreased step length - right;Decreased step length - left;Decreased dorsiflexion - right;Decreased dorsiflexion - left    Ambulation Surface Level;Indoor    Gait velocity 14.14 sec=0.56m/s      Standardized Balance Assessment   Standardized Balance Assessment Timed Up  and Go Test;Berg Balance Test      Berg Balance Test   Sit to Stand Able to stand  independently using hands    Standing Unsupported Able to stand 30 seconds unsupported    Sitting with Back Unsupported but Feet Supported on Floor or Stool Able to sit safely and securely 2 minutes    Stand to Sit Sits safely with minimal use of hands    Transfers Able to transfer safely, definite need of hands    Standing Unsupported with Eyes Closed Able to stand 3 seconds    Standing Unsupported with Feet Together Needs help to attain position and unable to hold for 15 seconds    From Standing, Reach Forward with Outstretched Arm Reaches forward but needs supervision    From Standing Position, Pick up Object from Floor Unable to try/needs assist to keep balance    From Standing Position, Turn to Look Behind Over each Shoulder Needs supervision when turning    Turn 360 Degrees Needs assistance while turning    Standing Unsupported, Alternately Place Feet on Step/Stool Able to complete >2 steps/needs minimal assist    Standing Unsupported, One Foot in [employer] balance while stepping or standing    Standing on One Leg Unable  to try or needs assist to prevent fall    Total Score 21      Timed Up and Go Test   TUG Normal TUG    Normal TUG (seconds) 18                      Objective measurements completed on examination: See above findings.               PT Education - 07/14/20 0810    Education Details PT plan of care. Instructed in sit to stand 5 x 2 getting balance each time to start at home on nondialysis days.    Person(s) Educated Patient    Methods Explanation    Comprehension Verbalized understanding            PT Short Term Goals - 07/14/20 0818      PT SHORT TERM GOAL #1   Title Pt will be independent with initial HEP for strengtheining and balance to decrease fall risk.    Time 4    Period Weeks    Status New    Target Date 08/20/20   due to delay in  scheduling     PT SHORT TERM GOAL #2   Title Pt will decrease 5 x sit to stand from 23. 67 sec to <20 sec from mat with hands for improved balance and functional strength.    Baseline 23.67 sec from low mat with hands at eval 07/14/20    Time 4    Period Weeks    Status New    Target Date 08/20/20      PT SHORT TERM GOAL #3   Title Pt will be further assessed for possible foot up brace versus AFO for left foot to improve mobility.    Time 4    Period Weeks    Status New    Target Date 08/20/20      PT SHORT TERM GOAL #4   Title Pt will amubulate >400' on varied surfaces with rollator mod I for short community distances.    Time 4    Period Weeks    Status New    Target Date 08/20/20             PT Long Term Goals - 07/14/20 6378      PT LONG TERM GOAL #1   Title Pt will be independent with progressive HEP for balance and strength to continue gains on own.    Time 8    Period Weeks    Status New    Target Date 09/17/20      PT LONG TERM GOAL #2   Title Pt will increase Berg Balance from 21/56 to >27/56 for improved balance and decreased fall risk.    Baseline 21/56 on 07/14/20    Time 8    Period Weeks    Status New    Target Date 09/17/20      PT LONG TERM GOAL #3   Title Pt will increase gait speed from 0.73m/s to >0.92m/s for improved gait safety in the community.    Baseline 0.67m/s on 07/14/20    Time 8    Period Weeks    Status New    Target Date 09/17/20      PT LONG TERM GOAL #4   Title Pt will decrease TUG from 18 sec to <16 se for improved balance and functional mobility.    Baseline 18 sec on 07/14/20 with rollator  Time 8    Period Weeks    Status New    Target Date 09/17/20      PT LONG TERM GOAL #5   Title Pt will ambulate 250' with SPC versus no AD on level surfaces for improved mobility in home.    Time 8    Period Weeks    Status New    Target Date 09/17/20                  Plan - 07/14/20 0811    Clinical Impression  Statement 49 year old female with an extensive past medical history including but not limited to prior CVA in 2018, type 2 diabetes, dyslipidemia, end-stage renal disease on hemodialysis, GERD, hypertension who went in for elective exchange of right tunneled HD catheter by IR and coded with PEA arrest. Pt presents with decreased balance and is high fall risk based on 5 x sit to stand of 23.67 sec, Berg of 21/56 and TUG of 18 sec. Pt requires use of walker to ambulate currently with gait speed of 0.34m/s which shows decreased safety for community ambulator. Pt has used walker since her CVA in 2018. Pt has decreased sensation in bilateral feet with decreased strength throughout BLE with ankles and hips being most significant. Pt will benefit from skilled PT to address strength, balance and functional mobility deficits.    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 Trial foot up brace on left versus AFO to see if helps. Establish initial HEP for strengthening and balance.    Consulted and Agree with Plan of Care Patient           Patient will benefit from skilled  therapeutic intervention in order to improve the following deficits and impairments:  Abnormal gait, Decreased activity tolerance, Decreased balance, Decreased endurance, Decreased knowledge of use of DME, Decreased mobility, Decreased range of motion, Difficulty walking, Decreased strength, Impaired sensation  Visit Diagnosis: Other abnormalities of gait and mobility  Muscle weakness (generalized)  Unsteadiness on feet     Problem List Patient Active Problem List   Diagnosis Date Noted  . Positive ANA (antinuclear antibody) 04/16/2020  . ESRD on dialysis ([place])   . Hemiplegia and hemiparesis following cerebral infarction affecting left non-dominant side ([place]) 12/29/2018  . IBS (irritable bowel syndrome) 08/06/2017  . Dyslipidemia associated with type 2 diabetes mellitus ([place]) 06/25/2016  . Proliferative diabetic retinopathy ([place]) 09/29/2015  . B12 deficiency 05/10/2014  . Anemia in chronic kidney disease 04/26/2014  . CKD (chronic kidney disease) stage 5, GFR less than 15 ml/min ([facility]) 04/26/2014  . Secondary hyperparathyroidism of renal origin ([place]) 11/27/2013  . Posterior subcapsular cataract, bilateral 10/18/2013  . Diabetic neuropathy, painful ([place]), on low dose Gabapentin 07/13/2013  . Diabetes mellitus, type II, insulin dependent ([place]), on CGM, with end organ damage: renal, neuropathy, gastroparesis, retinopathy 02/19/2011  . Hypertension associated with  diabetes ([place]) 02/19/2011  . Gastroparesis due to DM ([place]) 02/19/2011    Electa Sniff, PT, DPT, NCS 07/14/2020, 8:27 AM  [facility] [address] [place], [place], [address] Phone: [phone]   Fax:  [phone]  Name: Heather [name] MRN: [id number] Date of Birth: 01/08/1971

## 2020-07-16 ENCOUNTER — Encounter: Payer: Managed Care, Other (non HMO) | Admitting: Occupational Therapy

## 2020-07-16 ENCOUNTER — Other Ambulatory Visit: Payer: Self-pay

## 2020-07-16 ENCOUNTER — Encounter: Payer: Self-pay | Admitting: Family Medicine

## 2020-07-16 ENCOUNTER — Ambulatory Visit (INDEPENDENT_AMBULATORY_CARE_PROVIDER_SITE_OTHER): Payer: Managed Care, Other (non HMO) | Admitting: Family Medicine

## 2020-07-16 VITALS — BP 191/106 | HR 92 | Temp 98.1°F | Ht 68.0 in | Wt 226.0 lb

## 2020-07-16 DIAGNOSIS — R059 Cough, unspecified: Secondary | ICD-10-CM

## 2020-07-16 DIAGNOSIS — E119 Type 2 diabetes mellitus without complications: Secondary | ICD-10-CM

## 2020-07-16 DIAGNOSIS — I10 Essential (primary) hypertension: Secondary | ICD-10-CM

## 2020-07-16 DIAGNOSIS — R05 Cough: Secondary | ICD-10-CM | POA: Diagnosis not present

## 2020-07-16 DIAGNOSIS — E1159 Type 2 diabetes mellitus with other circulatory complications: Secondary | ICD-10-CM

## 2020-07-16 DIAGNOSIS — Z794 Long term (current) use of insulin: Secondary | ICD-10-CM

## 2020-07-16 LAB — POCT GLYCOSYLATED HEMOGLOBIN (HGB A1C): Hemoglobin A1C: 6.4 % — AB (ref 4.0–5.6)

## 2020-07-16 MED ORDER — FREESTYLE LIBRE 14 DAY SENSOR MISC: 1.0000 | 6 refills | Status: DC

## 2020-07-16 MED ORDER — NOVOLOG FLEXPEN 100 UNIT/ML ~~LOC~~ SOPN: 6.0000 [IU] | PEN_INJECTOR | Freq: Three times a day (TID) | SUBCUTANEOUS | Status: DC

## 2020-07-16 MED ORDER — AMLODIPINE BESYLATE 5 MG PO TABS
5.0000 mg | ORAL_TABLET | Freq: Every day | ORAL | 3 refills | Status: DC
Start: 2020-07-16 — End: 2021-09-17

## 2020-07-16 NOTE — Progress Notes (Signed)
   Heather [name] is a 49 y.o. female who presents today for an office visit.  Assessment/Plan:  New/Acute Problems: Cough Unclear etiology.  Possibly related to recent intubation.  Recent x-ray within normal limits.  Given persistent forceful cough will check CT scan at this point.  May need referral to pulmonology depending on CT results.  Chronic Problems Addressed Today: Hypertension associated with diabetes (Heather [name]) We will increase amlodipine to 5 mg daily for a few days.  She will then increase to 10 mg daily if needed.  She will check in with me in a week or so via MyChart.  Diabetes mellitus, type II, insulin dependent (Heather [name]), on CGM, with end organ damage: renal, neuropathy, gastroparesis, retinopathy A1c well controlled at 6.4.  Continue Tresiba 26 units daily.  Continue NovoLog 6 units 3 times daily.  Recheck A1c in 6 months.     Subjective:  HPI:  Patient here for follow-up.  Last seen about a month ago.  She has had persistent cough since then.  Symptoms are overall the same.  Cough is very forceful.  Occurs randomly.  She has also had issues with increasing blood pressure.  Blood pressures have been as high as the 180s to 190s over 100s.  She has had some associated headache.  She was recently started on amlodipine 2.5 mg daily but it does not think this has had a significant effect.  She has been otherwise doing well.  Home sugars have been usually running in the upper 160s.       Objective:  Physical Exam: BP (!) 191/106   Pulse 92   Temp 98.1 F (36.7 C)   Ht 5\' 8"  (1.727 m)   Wt 226 lb (102.5 kg)   SpO2 97%   BMI 34.36 kg/m   Gen: No acute distress, resting comfortably CV: Regular rate and rhythm with no murmurs appreciated Pulm: Normal work of breathing, clear to auscultation bilaterally with no crackles, wheezes, or rhonchi Neuro: Grossly normal, moves all extremities Psych: Normal affect and thought content      Maxton Noreen M. Jerline Pain, MD 07/16/2020  10:57 AM

## 2020-07-16 NOTE — Assessment & Plan Note (Signed)
We will increase amlodipine to 5 mg daily for a few days.  She will then increase to 10 mg daily if needed.  She will check in with me in a week or so via MyChart.

## 2020-07-16 NOTE — Assessment & Plan Note (Signed)
A1c well controlled at 6.4.  Continue Tresiba 26 units daily.  Continue NovoLog 6 units 3 times daily.  Recheck A1c in 6 months.

## 2020-07-16 NOTE — Patient Instructions (Addendum)
It was very nice to see you today!  Please increase your amlodipine to 5mg  daily. If it is still persistently elevated, please increase to 10mg  daily.  Please check in with me in a week or two to let me know how your blood pressures are looking.  We will check a CT scan to see what is causing your cough. They should call you in a few days to get this scheduled.  Your blood sugar look great.  Please keep up the good work.  I will see back in 6 months.  Come back to see me sooner if needed.   Take care, Dr Jerline Pain  Please try these tips to maintain a healthy lifestyle: h   Eat at least 3 REAL meals and 1-2 snacks per day.  Aim for no more than 5 hours between eating.  If you eat breakfast, please do so within one hour of getting up.    Each meal should contain half fruits/vegetables, one quarter protein, and one quarter carbs (no bigger than a computer mouse)   Cut down on sweet beverages. This includes juice, soda, and sweet tea.     Drink at least 1 glass of water with each meal and aim for at least 8 glasses per day   Exercise at least 150 minutes every week.

## 2020-07-21 ENCOUNTER — Encounter: Payer: Managed Care, Other (non HMO) | Admitting: Occupational Therapy

## 2020-07-23 ENCOUNTER — Encounter: Payer: Managed Care, Other (non HMO) | Admitting: Occupational Therapy

## 2020-07-24 ENCOUNTER — Telehealth: Payer: Self-pay | Admitting: Obstetrics and Gynecology

## 2020-07-24 DIAGNOSIS — Z30431 Encounter for routine checking of intrauterine contraceptive device: Secondary | ICD-10-CM

## 2020-07-24 DIAGNOSIS — N926 Irregular menstruation, unspecified: Secondary | ICD-10-CM

## 2020-07-24 NOTE — Telephone Encounter (Signed)
Patient is still bleeding with iud.

## 2020-07-24 NOTE — Telephone Encounter (Signed)
Spoke with patient. Mirena IUD placed 08/01/19. Patient reports bleeding was heavy 04/2020 and lasted for 3wks. No menses in June. Menses started again on 07/22/20. Changing saturated pad 4-5 times per day. Denies lightheadedness, SOB, weakness or dizziness. Patient goes for dialysis on Tuesdays and Thursdays, was advised her "blood was low".  Last Hgb in Epic by PCP on 06/04/20 was 9.6.   Advised PUS needed for further evaluation of IUD placement and irregular bleeding. Patient initially declined to schedule in office, requested outside facility to meet scheduling needs, reviewed scheduling options. Patient request to schedule in the afternoon at [facility].   PUS scheduled for 07/29/20 at 4pm, consult to follow. Order placed for precert.  ER precautions reviewed for heavy bleeding or new/worsneing symptoms.  Advised I will review with Dr. Talbert Nan and return call if any additional recommendations, patient agreeable.   Dr. Talbert Nan -please review schedule, ok to proceed as scheduled?   Cc: Hayley Carder

## 2020-07-25 NOTE — Telephone Encounter (Signed)
Spoke with patient regarding benefits for recommended ultrasound. Patient acknowledges understanding of information presented.

## 2020-07-28 ENCOUNTER — Encounter: Payer: Managed Care, Other (non HMO) | Admitting: Occupational Therapy

## 2020-07-29 ENCOUNTER — Other Ambulatory Visit: Payer: Self-pay | Admitting: Obstetrics and Gynecology

## 2020-07-29 ENCOUNTER — Other Ambulatory Visit: Payer: Self-pay

## 2020-07-29 ENCOUNTER — Telehealth: Payer: Self-pay | Admitting: Obstetrics and Gynecology

## 2020-07-29 DIAGNOSIS — Z30431 Encounter for routine checking of intrauterine contraceptive device: Secondary | ICD-10-CM

## 2020-07-29 DIAGNOSIS — N926 Irregular menstruation, unspecified: Secondary | ICD-10-CM

## 2020-07-29 NOTE — Telephone Encounter (Signed)
Patient canceled her PUS appointment today. She stated " I am not feeling well after dialysis". To triage to reschedule.  [place] policy?

## 2020-07-29 NOTE — Telephone Encounter (Signed)
Call placed to [facility] scheduling at [phone]. Left message to call Sharee Pimple, RN at [facility] to schedule PUS.

## 2020-07-29 NOTE — Telephone Encounter (Signed)
Per review of Epic, patient is scheduled at [facility] on 07/30/20 at 12:30pm.   Call returned to patient, OV scheduled for consult on 08/11/20 at 9:30am with Dr. Talbert Nan. Patient verbalizes understanding and is agreeable.   Routing to provider for final review. Patient is agreeable to disposition. Will close encounter.  Cc: Hayley Carder

## 2020-07-29 NOTE — Telephone Encounter (Signed)
Spoke with Hoyle Sauer at [employer]. Hoyle Sauer will contact patient directly to schedule.

## 2020-07-29 NOTE — Telephone Encounter (Signed)
Spoke with patient. Patient reports bleeding has slowed to spotting. Request to proceed with scheduling PUS at outside facility in [facility] on Monday, Wed or Friday. Advised patient I will call to schedule at [facility] and return call to advise of appt and schedule consult in office with Dr. Talbert Nan. Patient notified of cancellation policy. Patient verbalizes understanding and is agreeable.

## 2020-07-30 ENCOUNTER — Ambulatory Visit
Admission: RE | Admit: 2020-07-30 | Discharge: 2020-07-30 | Disposition: A | Discharge status: Home / Self Care | Payer: Managed Care, Other (non HMO) | Source: Ambulatory Visit | Attending: Family Medicine | Admitting: Family Medicine

## 2020-07-30 ENCOUNTER — Other Ambulatory Visit: Payer: Self-pay

## 2020-07-30 ENCOUNTER — Ambulatory Visit (HOSPITAL_BASED_OUTPATIENT_CLINIC_OR_DEPARTMENT_OTHER)
Admission: RE | Admit: 2020-07-30 | Discharge: 2020-07-30 | Disposition: A | Discharge status: Home / Self Care | Payer: Managed Care, Other (non HMO) | Source: Ambulatory Visit | Attending: Obstetrics and Gynecology | Admitting: Obstetrics and Gynecology

## 2020-07-30 ENCOUNTER — Encounter: Payer: Managed Care, Other (non HMO) | Admitting: Occupational Therapy

## 2020-07-30 ENCOUNTER — Ambulatory Visit: Payer: Managed Care, Other (non HMO) | Attending: Family Medicine

## 2020-07-30 DIAGNOSIS — R2689 Other abnormalities of gait and mobility: Secondary | ICD-10-CM | POA: Diagnosis not present

## 2020-07-30 DIAGNOSIS — N926 Irregular menstruation, unspecified: Secondary | ICD-10-CM | POA: Diagnosis not present

## 2020-07-30 DIAGNOSIS — R2681 Unsteadiness on feet: Secondary | ICD-10-CM | POA: Insufficient documentation

## 2020-07-30 DIAGNOSIS — Z30431 Encounter for routine checking of intrauterine contraceptive device: Secondary | ICD-10-CM | POA: Insufficient documentation

## 2020-07-30 DIAGNOSIS — M6281 Muscle weakness (generalized): Secondary | ICD-10-CM | POA: Insufficient documentation

## 2020-07-30 DIAGNOSIS — R5381 Other malaise: Secondary | ICD-10-CM | POA: Diagnosis present

## 2020-07-30 DIAGNOSIS — R059 Cough, unspecified: Secondary | ICD-10-CM

## 2020-07-31 ENCOUNTER — Telehealth: Payer: Self-pay

## 2020-07-31 ENCOUNTER — Other Ambulatory Visit: Payer: Self-pay

## 2020-07-31 ENCOUNTER — Other Ambulatory Visit: Payer: Self-pay | Admitting: *Deleted

## 2020-07-31 ENCOUNTER — Telehealth: Payer: Self-pay | Admitting: *Deleted

## 2020-07-31 DIAGNOSIS — Z975 Presence of (intrauterine) contraceptive device: Secondary | ICD-10-CM

## 2020-07-31 DIAGNOSIS — M6281 Muscle weakness (generalized): Secondary | ICD-10-CM

## 2020-07-31 DIAGNOSIS — R918 Other nonspecific abnormal finding of lung field: Secondary | ICD-10-CM

## 2020-07-31 NOTE — Progress Notes (Signed)
Please inform patient of the following:  CT scan showed scatter nodules in her lungs. This are a non specific finding that likely is due to inflammation. Recommend that we refer her to a lung doctor for further evaluation. Please place referral to pulmonology.  Heather [name]. Jerline Pain, MD 07/31/2020 12:12 PM

## 2020-07-31 NOTE — Telephone Encounter (Signed)
Order placed

## 2020-07-31 NOTE — Therapy (Signed)
[facility] [address] [place], [place], [address] Phone: [phone]   Fax:  [phone]  Physical Therapy Treatment  Patient Details  Name: Heather [name] MRN: [id number] Date of Birth: August 04, 1971 Referring Provider (PT): Dimas Chyle   Encounter Date: 07/30/2020   PT End of Session - 07/30/20 1751    Visit Number 2    Number of Visits 17    Date for PT Re-Evaluation 29/47/65   90 day cert, 60 day poc   PT Start Time 4650    PT Stop Time 1830    PT Time Calculation (min) 45 min    Equipment Utilized During Treatment Gait belt    Activity Tolerance Patient tolerated treatment well    Behavior During Therapy Heather [name] for tasks assessed/performed           Past Medical History:  Diagnosis Date  . Antiplatelet or antithrombotic long-term use: Plavix 06/30/2017  . B12 deficiency 05/10/2014  . Blood transfusion without reported diagnosis   . Chronic constipation   . CVA (cerebral vascular accident) ([place]), nonhemorrhagic, inferior right cerebellum 12/03/2017   left side weakness in leg  . Cyst of left ovary 01/12/2018  . Diabetic neuropathy, painful ([place]), on low dose Gabapentin 07/13/2013  . DM (diabetes mellitus), type 2, uncontrolled, with renal complications ([place]) 35/46/5681  . DM2 (diabetes mellitus, type 2) ([place])   . Dysfunction of left eustachian tube, with pusatile tinnitus 11/24/2017  . Dyslipidemia associated with type 2 diabetes mellitus ([place]) 06/25/2016  . ESRD (end stage renal disease) on dialysis [facility])    On hemodialysis in July 2019 via [facility] then switched to [facility] in Nov 2019.   [name] ESRD with anemia ([place])   . Fibromyalgia   . Gastroparesis due to DM   . GERD (gastroesophageal reflux disease)   . Headache   . Hypertension associated with diabetes ([place]) 02/19/2011  . IBS (irritable bowel syndrome)   . Left-sided weakness 12/10/2017   .  [name] Leiomyoma of uterus   . Pancreatitis   . PE (pulmonary  thromboembolism) ([place]) 11/04/2019  . Pneumonia   . Proliferative diabetic retinopathy ([employer]) 09/29/2015  . 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 Reactive depression 12/10/2017   .  [name] Right-sided low back pain without sciatica 12/08/2015  . Secondary hyperthyroidism 11/27/2013  . Steal syndrome dialysis vascular access ([place]) 02/17/2018  . Thromboembolism ([place]) 04/05/2018  . Upper airway cough syndrome, with recs to stay off ACE and take Pepcid q hs 11/15/2016    Past Surgical History:  Procedure Laterality Date  . A/V FISTULAGRAM Right 03/03/2020   Procedure: Venogram;  Surgeon: Waynetta Sandy, MD;  Location: [facility];  Service: Cardiovascular;  Laterality: Right;  . ARTERY REPAIR Right 02/17/2018   Procedure: EXPLORATION OF RIGHT BRACHIAL ARTERY;  Surgeon: Conrad Nora, MD;  Location: [facility];  Service: Vascular;  Laterality: Right;  . ARTERY REPAIR Right 02/17/2018   Procedure: BRACHIAL ARTERY EXPLORATION AND TRHOMBECTOMY;  Surgeon: Conrad McMullin, MD;  Location: [facility] OR;  Service: Vascular;  Laterality: Right;  . AV FISTULA PLACEMENT Left 05/09/2017   Procedure: INSERTION OF ARTERIOVENOUS (AV) GRAFT ARM (ARTEGRAFT);  Surgeon: Conrad McKinley, MD;  [name];  Service: Vascular;  Laterality: Left;  . AV FISTULA PLACEMENT Right 02/17/2018   Procedure: INSERTION OF ARTERIOVENOUS (AV) GORE-TEX GRAFT ARM RIGHT UPPER ARM;  Surgeon: Conrad , MD;  Location: [facility];  Service: Vascular;  Laterality: Right;  .  AV FISTULA PLACEMENT Left 10/10/2019   Procedure: INSERTION OF LEFT ARTERIOVENOUS (AV) ARTEGRAFT GRAFT ARM;  Surgeon: Waynetta Sandy, MD;  Location: [facility];  Service: Vascular;  Laterality: Left;  . AV FISTULA PLACEMENT Right 03/19/2020   Procedure: INSERTION OF ARTERIOVENOUS (AV) GORE-TEX GRAFT IN RIGHT ARM AND VIABAHN STENT IN RIGHT AXILLARY ARTERY;  Surgeon: Waynetta Sandy, MD;  Location: [facility];  Service: Vascular;  Laterality: Right;  .  BASCILIC VEIN TRANSPOSITION Left 08/31/2016   Procedure: BASILIC VEIN TRANSPOSITION FIRST STAGE;  Surgeon: Conrad [place] Ivanhoe, MD;  Location: [facility];  Service: Vascular;  Laterality: Left;  . CENTRAL VENOUS CATHETER INSERTION Left 07/24/2018   Procedure: INSERTION CENTRAL LINE ADULT;  Surgeon: Conrad Berlin, MD;  Location: [facility];  Service: Vascular;  Laterality: Left;  . EYE SURGERY     secondary to diabetic retinopathy   . FOOT SURGERY Right    t"ook bone out- maybe hammer toe"  . INSERTION OF DIALYSIS CATHETER Left 07/24/2018   Procedure: INSERTION OF TUNNELED DIALYSIS CATHETER;  Surgeon: Conrad Piney, MD;  Location: [facility];  Service: Vascular;  Laterality: Left;  . IR FLUORO GUIDE CV LINE RIGHT  07/21/2018  . IR FLUORO GUIDE CV LINE RIGHT  06/01/2019  . IR FLUORO GUIDE CV LINE RIGHT  05/05/2020  . IR US GUIDE VASC ACCESS RIGHT  07/21/2018  . IR US GUIDE VASC ACCESS RIGHT  06/01/2019  . LIGATION ARTERIOVENOUS GORTEX GRAFT Right 03/20/2020   Procedure: LIGATION ARM ARTERIOVENOUS GORTEX GRAFT With Patch Angioplasty, Thrombectomy;  Surgeon: Waynetta Sandy, MD;  Location: [facility];  Service: Vascular;  Laterality: Right;  . LIGATION OF ARTERIOVENOUS  FISTULA Left 05/09/2017   Procedure: LIGATION OF ARTERIOVENOUS  FISTULA;  Surgeon: Conrad La Croft, MD;  Location: [facility];  Service: Vascular;  Laterality: Left;  . LOOP RECORDER INSERTION N/A 12/05/2017   Procedure: LOOP RECORDER INSERTION;  Surgeon: Evans Lance, MD;  Location: [facility];  Service: Cardiovascular;  Laterality: N/A;  . MYOMECTOMY    . REMOVAL OF GRAFT Right 02/17/2018   Procedure: REMOVAL OF RIGHT UPPER ARM ARTERIOVENOUS GRAFT;  Surgeon: Conrad Stonewall, MD;  Location: [facility];  Service: Vascular;  Laterality: Right;  . REVISION OF ARTERIOVENOUS GORETEX GRAFT Left 07/24/2018   Procedure: REDO ARTERIOVENOUS GORETEX GRAFT;  Surgeon: Conrad Southmayd, MD;  Location: [facility];  Service: Vascular;  Laterality: Left;  . TEE WITHOUT CARDIOVERSION N/A  12/05/2017   Procedure: TRANSESOPHAGEAL ECHOCARDIOGRAM (TEE);  Surgeon: Sanda Klein, MD;  Location[facility];  Service: Cardiovascular;  Laterality: N/A;  . UPPER EXTREMITY VENOGRAPHY Left 07/13/2018   Procedure: UPPER EXTREMITY VENOGRAPHY - Central & Left Arm;  Surgeon: Conrad , MD;  Location: [facility];  Service: Cardiovascular;  Laterality: Left;  . UPPER EXTREMITY VENOGRAPHY Bilateral 09/10/2019   Procedure: UPPER EXTREMITY VENOGRAPHY;  Surgeon: Waynetta Sandy, MD;  Location: [facility];  Service: Cardiovascular;  Laterality: Bilateral;  . UTERINE FIBROID SURGERY    . VITRECTOMY Bilateral     There were no vitals filed for this visit.   Subjective Assessment - 07/30/20 1751    Subjective Pt reports handle on her rollator broke. Came in holding to husband. PT brought out our clinic rollator to use during session.    Pertinent History CVA in 2018, type 2 diabetes, dyslipidemia, end-stage renal disease on hemodialysis, GERD, hypertension    Patient Stated Goals To get off walker so she can  get on transplant list.    Currently in Pain? No/denies    Pain Onset More than a month ago                             [facility] Adult PT Treatment/Exercise - 07/30/20 1751      Transfers   Transfers Sit to Stand;Stand to Sit    Sit to Stand 5: Supervision    Sit to Stand Details Verbal cues for technique    Sit to Stand Details (indicate cue type and reason) Pt was cued to scoot out a little and bring feet back under knees prior to starting. Cued to lean forward.    Stand to Sit 5: Supervision    Stand to Sit Details (indicate cue type and reason) Verbal cues for technique    Stand to Sit Details Verbal cues to control descent      Ambulation/Gait   Ambulation/Gait Yes    Ambulation/Gait Assistance 5: Supervision    Ambulation/Gait Assistance Details Trialed left foot up brace first bout with some improvement on left toe clearance but still  quick foot flat. Utilized left ottobock PLS AFO last 2 bouts which improved  foor clearance significantly with good heel strike. Pt reported feeling better with AFO as well.  Seated rest break between bouts as pt fatigues. Pt has decreased DF on right but able to clear safely compared to left.    Ambulation Distance (Feet) 115 Feet   115' x 2   Assistive device Rollator    Gait Pattern Step-through pattern;Decreased dorsiflexion - right;Decreased dorsiflexion - left;Decreased step length - right;Decreased step length - left    Ambulation Surface Level;Indoor      Exercises   Exercises Other Exercises    Other Exercises  Sidelying clamshell each side x 10 with verbal cues to not roll back. Pt reported feeling it in glut med area towards end. Bridges x 10 with verbal cues to try to keep pelvis level with more lift on left as tends to lag behind. Also cued to brace tummy to support back. Sit to stand from edge of mat x 5 with verbal cues to lean forward and bring feet back.                  PT Education - 07/31/20 1200    Education Details Issued hand written HEP for sit to stands 5 x 2, clamshell x 10 and bridges x 10 to perform on nondialysis days. Medbridge was not wokring so unable to give through there at session. Discussed AFO process and that PT would request order.    Person(s) Educated Patient;Spouse    Methods Explanation;Demonstration;Handout    Comprehension Verbalized understanding;Returned demonstration            PT Short Term Goals - 07/14/20 0818      PT SHORT TERM GOAL #1   Title Pt will be independent with initial HEP for strengtheining and balance to decrease fall risk.    Time 4    Period Weeks    Status New    Target Date 08/20/20   due to delay in scheduling     PT SHORT TERM GOAL #2   Title Pt will decrease 5 x sit to stand from 23. 67 sec to <20 sec from mat with hands for improved balance and functional strength.    Baseline 23.67 sec from low mat with  hands at eval 07/14/20  Time 4    Period Weeks    Status New    Target Date 08/20/20      PT SHORT TERM GOAL #3   Title Pt will be further assessed for possible foot up brace versus AFO for left foot to improve mobility.    Time 4    Period Weeks    Status New    Target Date 08/20/20      PT SHORT TERM GOAL #4   Title Pt will amubulate >400' on varied surfaces with rollator mod I for short community distances.    Time 4    Period Weeks    Status New    Target Date 08/20/20             PT Long Term Goals - 07/14/20 3662      PT LONG TERM GOAL #1   Title Pt will be independent with progressive HEP for balance and strength to continue gains on own.    Time 8    Period Weeks    Status New    Target Date 09/17/20      PT LONG TERM GOAL #2   Title Pt will increase Berg Balance from 21/56 to >27/56 for improved balance and decreased fall risk.    Baseline 21/56 on 07/14/20    Time 8    Period Weeks    Status New    Target Date 09/17/20      PT LONG TERM GOAL #3   Title Pt will increase gait speed from 0.51m/s to >0.33m/s for improved gait safety in the community.    Baseline 0.20m/s on 07/14/20    Time 8    Period Weeks    Status New    Target Date 09/17/20      PT LONG TERM GOAL #4   Title Pt will decrease TUG from 18 sec to <16 se for improved balance and functional mobility.    Baseline 18 sec on 07/14/20 with rollator    Time 8    Period Weeks    Status New    Target Date 09/17/20      PT LONG TERM GOAL #5   Title Pt will ambulate 250' with SPC versus no AD on level surfaces for improved mobility in home.    Time 8    Period Weeks    Status New    Target Date 09/17/20                 Plan - 07/31/20 1202    Clinical Impression Statement PT trialed left foot-up brace and left ottobock PLS AFO. Pt benefited significantly from AFO with much improved left foot clearance and was able to take larger steps. PT requesting order for AFO.Pt does fatigue  quickly and needs frequent breaks.    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 Evolving/Moderate complexity    Rehab Potential Good    PT Frequency 2x / week    PT Duration 8 weeks    PT Treatment/Interventions ADLs/Self Care Home Management;DME Instruction;Gait training;Stair training;Functional mobility training;Neuromuscular re-education;Balance training;Therapeutic exercise;Therapeutic activities;Patient/family education;Orthotic Fit/Training;Manual techniques;Passive range of motion;Energy conservation    PT Next Visit Plan Issued medbridge HEP for sit to stands 5 x 2, clamshell x 10 and bridges x 10 to  perform on nondialysis days as I had to give hand written copy since medbridge wasn't working. Did AFO order come back. If so let Heather [name] know and I'll get her set up with orthotist for left PLS AFO. Continue gait training with left ottobock PLS AFO. Continue strengthening and start balance exercises. monitor vitals as needed.    Consulted and Agree with Plan of Care Patient           Patient will benefit from skilled therapeutic intervention in order to improve the following deficits and impairments:  Abnormal gait, Decreased activity tolerance, Decreased balance, Decreased endurance, Decreased knowledge of use of DME, Decreased mobility, Decreased range of motion, Difficulty walking, Decreased strength, Impaired sensation  Visit Diagnosis: Other abnormalities of gait and mobility  Muscle weakness (generalized)  Unsteadiness on feet     Problem List Patient Active Problem List   Diagnosis Date Noted  . Positive ANA (antinuclear antibody) 04/16/2020  . ESRD on dialysis  ([place])   . Hemiplegia and hemiparesis following cerebral infarction affecting left non-dominant side ([place]) 12/29/2018  . IBS (irritable bowel syndrome) 08/06/2017  . Dyslipidemia associated with type 2 diabetes mellitus ([place]) 06/25/2016  . Proliferative diabetic retinopathy ([place]) 09/29/2015  . B12 deficiency 05/10/2014  . Anemia in chronic kidney disease 04/26/2014  . CKD (chronic kidney disease) stage 5, GFR less than 15 ml/min ([facility]) 04/26/2014  . Secondary hyperparathyroidism of renal origin ([place]) 11/27/2013  . Posterior subcapsular cataract, bilateral 10/18/2013  . Diabetic neuropathy, painful ([place]), on low dose Gabapentin 07/13/2013  . Diabetes mellitus, type II, insulin dependent ([place]), on CGM, with end organ damage: renal, neuropathy, gastroparesis, retinopathy 02/19/2011  . Hypertension associated with diabetes ([place]) 02/19/2011  . Gastroparesis due to DM ([place]) 02/19/2011    Electa Sniff, PT, DPT, NCS 07/31/2020, 12:08 PM  [facility] [address] [place], [place], [address] Phone: [phone]   Fax:  [phone]  Name: Heather [name] MRN: [id number] Date of Birth: 17-Nov-1971

## 2020-07-31 NOTE — Telephone Encounter (Signed)
-----   Message from Salvadore Dom, MD sent at 07/30/2020  5:23 PM EDT ----- Please let the patient know that the IUD wasn't seen, I suspect it came out when she was bleeding heavily. She needs an abdominal flat plate, KUB, to make sure the IUD isn't in her abdominal cavity (not likely but important to confirm).  She needs an appointment to make further plans after the X-Ray.

## 2020-07-31 NOTE — Telephone Encounter (Signed)
Dr. Jerline Pain, Heather [name] is being treated by physical therapy for muscle weakness.  She will benefit from use of left AFO in order to improve safety with functional mobility.    If you agree, please submit request in EPIC under MD Order, Other Orders (list left AFO in comments) or fax to [facility] at [phone].   Thank you, Cherly Anderson, PT, DPT, [facility] [address] [place], [place]  [address] Phone:  [phone] Fax:  [phone]

## 2020-07-31 NOTE — Telephone Encounter (Signed)
[name], RN  07/31/2020 10:01 AM EDT Back to Top    Spoke with patient, advised per Dr. Talbert Nan. Patient agreeable to proceed with Xray, request to schedule at [facility]. Advised patient I will place order, [facility] will contact you directly for scheduling. Patient has OV scheduled for 08/11/20 at 9:30am. Patient agreeable.   See 07/31/20 telephone encounter.   Order placed for KUB, Abd 1 view at [facility].   Call to [facility] at [place]. Was advised patient can walk in for Xray, no appt needed, arrive before 3pm. If after 3pm will need to go to ER registration and advise here for Xray, order in [place].   Call returned to patient to advise. No answer, voicemail no set up.  MyChart message to patient.

## 2020-07-31 NOTE — Telephone Encounter (Signed)
Ok with me. Please place any necessary orders. 

## 2020-08-04 ENCOUNTER — Encounter: Payer: Managed Care, Other (non HMO) | Admitting: Occupational Therapy

## 2020-08-05 NOTE — Telephone Encounter (Signed)
Per review of Epic, patient did not go to [facility] for Xray.  Patient did not review detailed MyChart message.   Call to patient. Patient declines Xray and request to cancel OV scheduled for 08/11/20 for PUS/contraceptive consult. Patient states she is leaving for [facility] and will not be returning until sometime later next week.   Advised Xray important to confirm IUD isn't in her abdominal cavity, since she did not see the IUD after it was expelled. Patient again declines Xray at this time. Advised I will provide update to Dr. Talbert Nan and return call with any additional recommendations.   Dr. Talbert Nan -please review.

## 2020-08-05 NOTE — Telephone Encounter (Signed)
Please let the patient know that while there is a high likelihood that the IUD came out when she was bleeding heavily, if it is in her pelvis it could cause serious problems for her and would need to be removed.

## 2020-08-05 NOTE — Telephone Encounter (Signed)
Spoke with patient, advised per Dr. Talbert Nan. Patient states she will go for the Xray, but can't guarantee it will be this week before she travels. Patient will call to notify the office when she has completed the Xray. Strongly encouraged patient to complete the Xray ASAP, advised she can go to [facility] at anytime, if after 3pm go to ER registration and let them know you have an order for Xray to be completed. Patient declines to schedule f/u visit at this time, will schedule when she returns call.   ER precautions provided.  Routing to Dr. Rosann Auerbach.  Will keep in open encounter to f/u on Xray.

## 2020-08-06 ENCOUNTER — Encounter: Payer: Managed Care, Other (non HMO) | Admitting: Occupational Therapy

## 2020-08-06 ENCOUNTER — Ambulatory Visit: Payer: Managed Care, Other (non HMO)

## 2020-08-11 ENCOUNTER — Ambulatory Visit: Payer: Managed Care, Other (non HMO)

## 2020-08-11 ENCOUNTER — Ambulatory Visit: Payer: Self-pay | Admitting: Obstetrics and Gynecology

## 2020-08-13 ENCOUNTER — Ambulatory Visit: Payer: Managed Care, Other (non HMO)

## 2020-08-13 ENCOUNTER — Ambulatory Visit (HOSPITAL_BASED_OUTPATIENT_CLINIC_OR_DEPARTMENT_OTHER)
Admission: RE | Admit: 2020-08-13 | Discharge: 2020-08-13 | Disposition: A | Discharge status: Home / Self Care | Payer: Managed Care, Other (non HMO) | Source: Ambulatory Visit | Attending: Obstetrics and Gynecology | Admitting: Obstetrics and Gynecology

## 2020-08-13 ENCOUNTER — Other Ambulatory Visit: Payer: Self-pay

## 2020-08-13 VITALS — BP 170/90

## 2020-08-13 DIAGNOSIS — Z975 Presence of (intrauterine) contraceptive device: Secondary | ICD-10-CM | POA: Insufficient documentation

## 2020-08-13 DIAGNOSIS — R2689 Other abnormalities of gait and mobility: Secondary | ICD-10-CM

## 2020-08-13 DIAGNOSIS — M6281 Muscle weakness (generalized): Secondary | ICD-10-CM

## 2020-08-13 NOTE — Telephone Encounter (Signed)
Per review of Epic, KUB completed this morning at [facility].   Report pending.   Routing to Dr. Rosann Auerbach.   Encounter closed.

## 2020-08-13 NOTE — Therapy (Signed)
[facility] [address] [place], [place], [address] Phone: [phone]   Fax:  [phone]  Physical Therapy Treatment  Patient Details  Name: Heather [name] MRN: [id number] Date of Birth: [birth year] Referring Provider (PT): Dimas Chyle   Encounter Date: 08/13/2020   PT End of Session - 08/13/20 1752    Visit Number 3    Number of Visits 17    Date for PT Re-Evaluation 70/26/37   90 day cert, 60 day poc   PT Start Time 8588    PT Stop Time 5027   stopped early due to elevated BP   PT Time Calculation (min) 20 min    Equipment Utilized During Treatment Gait belt    Activity Tolerance Patient tolerated treatment well    Behavior During Therapy [facility] for tasks assessed/performed           Past Medical History:  Diagnosis Date  . Antiplatelet or antithrombotic long-term use: Plavix 06/30/2017  . B12 deficiency 05/10/2014  . Blood transfusion without reported diagnosis   . Chronic constipation   . CVA (cerebral vascular accident) ([place]), nonhemorrhagic, inferior right cerebellum 12/03/2017   left side weakness in leg  . Cyst of left ovary 01/12/2018  . Diabetic neuropathy, painful ([place]), on low dose Gabapentin 07/13/2013  . DM (diabetes mellitus), type 2, uncontrolled, with renal complications ([place]) 74/11/8785  . DM2 (diabetes mellitus, type 2) ([place])   . Dysfunction of left eustachian tube, with pusatile tinnitus 11/24/2017  . Dyslipidemia associated with type 2 diabetes mellitus ([place]) 06/25/2016  . ESRD (end stage renal disease) on dialysis [facility])    On hemodialysis in July 2019 via [facility] then switched to [facility] in Nov 2019.   [name] ESRD with anemia ([place])   . Fibromyalgia   . Gastroparesis due to DM   . GERD (gastroesophageal reflux disease)   . Headache   . Hypertension associated with diabetes ([place]) 02/19/2011  . IBS (irritable bowel syndrome)   . Left-sided weakness 12/10/2017   .  [name] Leiomyoma of uterus   .  Pancreatitis   . PE (pulmonary thromboembolism) ([place]) 11/04/2019  . Pneumonia   . Proliferative diabetic retinopathy ([place]) 09/29/2015  . 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 Reactive depression 12/10/2017   .  [name] Right-sided low back pain without sciatica 12/08/2015  . Secondary hyperthyroidism 11/27/2013  . Steal syndrome dialysis vascular access ([place]) 02/17/2018  . Thromboembolism ([place]) 04/05/2018  . Upper airway cough syndrome, with recs to stay off ACE and take Pepcid q hs 11/15/2016    Past Surgical History:  Procedure Laterality Date  . A/V FISTULAGRAM Right 03/03/2020   Procedure: Venogram;  Surgeon: Waynetta Sandy, MD;  Location: [place] [facility];  Service: Cardiovascular;  Laterality: Right;  . ARTERY REPAIR Right 02/17/2018   Procedure: EXPLORATION OF RIGHT BRACHIAL ARTERY;  Surgeon: Conrad De Leon, MD;  Location: [place];  Service: Vascular;  Laterality: Right;  . ARTERY REPAIR Right 02/17/2018   Procedure: BRACHIAL ARTERY EXPLORATION AND TRHOMBECTOMY;  Surgeon: Conrad [place], MD;  Location[facility] OR;  Service: Vascular;  Laterality: Right;  . AV FISTULA PLACEMENT Left 05/09/2017   Procedure: INSERTION OF ARTERIOVENOUS (AV) GRAFT ARM (ARTEGRAFT);  Surgeon: Conrad Jette, MD;  Location: [facility] OR;  Service: Vascular;  Laterality: Left;  . AV FISTULA PLACEMENT Right 02/17/2018   Procedure: INSERTION OF ARTERIOVENOUS (AV) GORE-TEX GRAFT ARM RIGHT UPPER ARM;  Surgeon: Conrad [place], MD;  Location: [place];  Service: Vascular;  Laterality: Right;  . AV FISTULA PLACEMENT Left 10/10/2019   Procedure: INSERTION OF LEFT ARTERIOVENOUS (AV) ARTEGRAFT GRAFT ARM;  Surgeon: Waynetta Sandy, MD;  Location: [place];  Service: Vascular;  Laterality: Left;  . AV FISTULA PLACEMENT Right 03/19/2020   Procedure: INSERTION OF ARTERIOVENOUS (AV) GORE-TEX GRAFT IN RIGHT ARM AND VIABAHN STENT IN RIGHT AXILLARY ARTERY;  Surgeon: Waynetta Sandy, MD;  Location: [place];  Service:  Vascular;  Laterality: Right;  . BASCILIC VEIN TRANSPOSITION Left 08/31/2016   Procedure: BASILIC VEIN TRANSPOSITION FIRST STAGE;  Surgeon: Conrad Noble, MD;  Location: [place];  Service: Vascular;  Laterality: Left;  . CENTRAL VENOUS CATHETER INSERTION Left 07/24/2018   Procedure: INSERTION CENTRAL LINE ADULT;  Surgeon: Conrad [place], MD;  Location: [place];  Service: Vascular;  Laterality: Left;  . EYE SURGERY     secondary to diabetic retinopathy   . FOOT SURGERY Right    t"ook bone out- maybe hammer toe"  . INSERTION OF DIALYSIS CATHETER Left 07/24/2018   Procedure: INSERTION OF TUNNELED DIALYSIS CATHETER;  Surgeon: Conrad Safford, MD;  Location: [place];  Service: Vascular;  Laterality: Left;  . IR FLUORO GUIDE CV LINE RIGHT  07/21/2018  . IR FLUORO GUIDE CV LINE RIGHT  06/01/2019  . IR FLUORO GUIDE CV LINE RIGHT  05/05/2020  . IR US GUIDE VASC ACCESS RIGHT  07/21/2018  . IR US GUIDE VASC ACCESS RIGHT  06/01/2019  . LIGATION ARTERIOVENOUS GORTEX GRAFT Right 03/20/2020   Procedure: LIGATION ARM ARTERIOVENOUS GORTEX GRAFT With Patch Angioplasty, Thrombectomy;  Surgeon: Waynetta Sandy, MD;  Location: [place];  Service: Vascular;  Laterality: Right;  . LIGATION OF ARTERIOVENOUS  FISTULA Left 05/09/2017   Procedure: LIGATION OF ARTERIOVENOUS  FISTULA;  Surgeon: Conrad [place], MD;  Location: [place];  Service: Vascular;  Laterality: Left;  . LOOP RECORDER INSERTION N/A 12/05/2017   Procedure: LOOP RECORDER INSERTION;  Surgeon: Evans Lance, MD;  Location: [place] [facility];  Service: Cardiovascular;  Laterality: N/A;  . MYOMECTOMY    . REMOVAL OF GRAFT Right 02/17/2018   Procedure: REMOVAL OF RIGHT UPPER ARM ARTERIOVENOUS GRAFT;  Surgeon: Conrad Lannon, MD;  Location: [place];  Service: Vascular;  Laterality: Right;  . REVISION OF ARTERIOVENOUS GORETEX GRAFT Left 07/24/2018   Procedure: REDO ARTERIOVENOUS GORETEX GRAFT;  Surgeon: Conrad [place], MD;  Location: [place];  Service: Vascular;  Laterality: Left;   . TEE WITHOUT CARDIOVERSION N/A 12/05/2017   Procedure: TRANSESOPHAGEAL ECHOCARDIOGRAM (TEE);  Surgeon: Sanda Klein, MD;  Location[facility];  Service: Cardiovascular;  Laterality: N/A;  . UPPER EXTREMITY VENOGRAPHY Left 07/13/2018   Procedure: UPPER EXTREMITY VENOGRAPHY - Central & Left Arm;  Surgeon: Conrad [place], MD;  Location: [place] CV [facility];  Service: Cardiovascular;  Laterality: Left;  . UPPER EXTREMITY VENOGRAPHY Bilateral 09/10/2019   Procedure: UPPER EXTREMITY VENOGRAPHY;  Surgeon: Waynetta Sandy, MD;  Location: [place];  Service: Cardiovascular;  Laterality: Bilateral;  . UTERINE FIBROID SURGERY    . VITRECTOMY Bilateral     Vitals:   08/13/20 1758  BP: (!) 170/90     Subjective Assessment - 08/13/20 1752    Subjective Pt reports that she was on a trip with her sister in [place] for 4 days. Has not had dialysis for a week and just got home last night. Goes tomorrow. Has not heard from [place].    Pertinent History CVA in 2018,  type 2 diabetes, dyslipidemia, end-stage renal disease on hemodialysis, GERD, hypertension    Patient Stated Goals To get off walker so she can get on transplant list.    Currently in Pain? No/denies    Pain Onset More than a month ago                             [facility] Adult PT Treatment/Exercise - 08/13/20 1807      Transfers   Transfers Sit to Stand;Stand to Sit    Sit to Stand 5: Supervision    Stand to Sit 5: Supervision      Ambulation/Gait   Ambulation/Gait Yes    Ambulation/Gait Assistance 5: Supervision    Ambulation/Gait Assistance Details Trialed left ottobock PLS AFO again. Pt still having improved left foot clearance with use. Was harder to donn due to more swelling in legs. BP=200/100 after    Ambulation Distance (Feet) 115 Feet    Assistive device Rollator    Gait Pattern Step-through pattern;Decreased step length - right;Decreased step length - left;Decreased dorsiflexion -  right;Decreased dorsiflexion - left    Ambulation Surface Level;Indoor                  PT Education - 08/13/20 2127    Education Details Pt was given number to [facility] to call to follow-up on AFO as PT had faxed last week. Advised pt to go home and take it easy as needs dialysis. If head aches worsens needs to seek medical help immediately.    Person(s) Educated Patient;Spouse    Methods Explanation    Comprehension Verbalized understanding            PT Short Term Goals - 07/14/20 0818      PT SHORT TERM GOAL #1   Title Pt will be independent with initial HEP for strengtheining and balance to decrease fall risk.    Time 4    Period Weeks    Status New    Target Date 08/20/20   due to delay in scheduling     PT SHORT TERM GOAL #2   Title Pt will decrease 5 x sit to stand from 23. 67 sec to <20 sec from mat with hands for improved balance and functional strength.    Baseline 23.67 sec from low mat with hands at eval 07/14/20    Time 4    Period Weeks    Status New    Target Date 08/20/20      PT SHORT TERM GOAL #3   Title Pt will be further assessed for possible foot up brace versus AFO for left foot to improve mobility.    Time 4    Period Weeks    Status New    Target Date 08/20/20      PT SHORT TERM GOAL #4   Title Pt will amubulate >400' on varied surfaces with rollator mod I for short community distances.    Time 4    Period Weeks    Status New    Target Date 08/20/20             PT Long Term Goals - 07/14/20 5027      PT LONG TERM GOAL #1   Title Pt will be independent with progressive HEP for balance and strength to continue gains on own.    Time 8    Period Weeks    Status New    Target  Date 09/17/20      PT LONG TERM GOAL #2   Title Pt will increase Berg Balance from 21/56 to >27/56 for improved balance and decreased fall risk.    Baseline 21/56 on 07/14/20    Time 8    Period Weeks    Status New    Target Date 09/17/20      PT LONG  TERM GOAL #3   Title Pt will increase gait speed from 0.19m/s to >0.2m/s for improved gait safety in the community.    Baseline 0.68m/s on 07/14/20    Time 8    Period Weeks    Status New    Target Date 09/17/20      PT LONG TERM GOAL #4   Title Pt will decrease TUG from 18 sec to <16 se for improved balance and functional mobility.    Baseline 18 sec on 07/14/20 with rollator    Time 8    Period Weeks    Status New    Target Date 09/17/20      PT LONG TERM GOAL #5   Title Pt will ambulate 250' with SPC versus no AD on level surfaces for improved mobility in home.    Time 8    Period Weeks    Status New    Target Date 09/17/20                 Plan - 08/13/20 2128    Clinical Impression Statement Treatment limited due to elevated BP. Pt has not been to dialysis for a week due to being out of town. Goes first thing in morning.    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 Evolving/Moderate complexity    Rehab Potential Good    PT Frequency 2x / week    PT Duration 8 weeks    PT Treatment/Interventions ADLs/Self Care Home Management;DME Instruction;Gait training;Stair training;Functional mobility training;Neuromuscular re-education;Balance training;Therapeutic exercise;Therapeutic activities;Patient/family education;Orthotic Fit/Training;Manual techniques;Passive range of motion;Energy conservation    PT Next Visit Plan Issue medbridge HEP for sit to stands 5 x 2, clamshell x 10 and bridges x 10 to perform on nondialysis days as I had to give hand written copy since medbridge wasn't working the first treatment session. Did pt talk to [place] and get set up yet? Continue  gait training with left ottobock PLS AFO. Continue strengthening and start balance exercises. monitor vitals as needed.    Consulted and Agree with Plan of Care Patient           Patient will benefit from skilled therapeutic intervention in order to improve the following deficits and impairments:  Abnormal gait, Decreased activity tolerance, Decreased balance, Decreased endurance, Decreased knowledge of use of DME, Decreased mobility, Decreased range of motion, Difficulty walking, Decreased strength, Impaired sensation  Visit Diagnosis: Other abnormalities of gait and mobility  Muscle weakness (generalized)     Problem List Patient Active Problem List   Diagnosis Date Noted  . Positive ANA (antinuclear antibody) 04/16/2020  . ESRD on dialysis ([place])   . Hemiplegia and hemiparesis following cerebral infarction affecting left non-dominant side ([place]) 12/29/2018  . IBS (irritable bowel syndrome) 08/06/2017  . Dyslipidemia associated with type 2 diabetes mellitus ([place]) 06/25/2016  . Proliferative diabetic retinopathy ([place]) 09/29/2015  . B12 deficiency 05/10/2014  .  Anemia in chronic kidney disease 04/26/2014  . CKD (chronic kidney disease) stage 5, GFR less than 15 ml/min ([facility]) 04/26/2014  . Secondary hyperparathyroidism of renal origin ([place]) 11/27/2013  . Posterior subcapsular cataract, bilateral 10/18/2013  . Diabetic neuropathy, painful ([place]), on low dose Gabapentin 07/13/2013  . Diabetes mellitus, type II, insulin dependent ([place]), on CGM, with end organ damage: renal, neuropathy, gastroparesis, retinopathy 02/19/2011  . Hypertension associated with diabetes ([name]) 02/19/2011  . Gastroparesis due to DM ([place]) 02/19/2011    Electa Sniff, PT, DPT, NCS 08/13/2020, 9:32 PM  [facility] [address] [place], [place], [address] Phone: [phone]   Fax:  [phone]  Name: Heather [name] MRN: [id number] Date of  Birth: 1971/10/04

## 2020-08-18 ENCOUNTER — Ambulatory Visit: Payer: Managed Care, Other (non HMO)

## 2020-08-18 ENCOUNTER — Other Ambulatory Visit: Payer: Self-pay

## 2020-08-18 DIAGNOSIS — R2689 Other abnormalities of gait and mobility: Secondary | ICD-10-CM

## 2020-08-18 DIAGNOSIS — R2681 Unsteadiness on feet: Secondary | ICD-10-CM

## 2020-08-18 DIAGNOSIS — M6281 Muscle weakness (generalized): Secondary | ICD-10-CM

## 2020-08-18 DIAGNOSIS — R5381 Other malaise: Secondary | ICD-10-CM

## 2020-08-18 NOTE — Patient Instructions (Signed)
Access Code: [email or web address] URL: [email or web address] Date: 08/18/2020 Prepared by: Markus Jarvis  Exercises Small Range Straight Leg Raise - 1 x daily - 7 x weekly - 2 sets - 10 reps Sidelying Diagonal Hip Abduction - 1 x daily - 7 x weekly - 2 sets - 10 reps Clamshell - 1 x daily - 7 x weekly - 2 sets - 10 reps Supine Bridge - 1 x daily - 7 x weekly - 2 sets - 10 reps

## 2020-08-18 NOTE — Therapy (Signed)
[facility] [address] [place], [place], [address] Phone: [phone]   Fax:  [phone]  Physical Therapy Treatment  Patient Details  Name: Heather [name] MRN: [id number] Date of Birth: Nov 05, 1971 Referring Provider (PT): Dimas Chyle   Encounter Date: 08/18/2020   PT End of Session - 08/18/20 1759    Visit Number 4    Number of Visits 17    Date for PT Re-Evaluation 81/27/51   90 day cert, 60 day poc   PT Start Time 1750    PT Stop Time 1835    PT Time Calculation (min) 45 min    Equipment Utilized During Treatment Gait belt    Activity Tolerance Patient tolerated treatment well    Behavior During Therapy [facility] for tasks assessed/performed           Past Medical History:  Diagnosis Date  . Antiplatelet or antithrombotic long-term use: Plavix 06/30/2017  . B12 deficiency 05/10/2014  . Blood transfusion without reported diagnosis   . Chronic constipation   . CVA (cerebral vascular accident) ([place]), nonhemorrhagic, inferior right cerebellum 12/03/2017   left side weakness in leg  . Cyst of left ovary 01/12/2018  . Diabetic neuropathy, painful ([place]), on low dose Gabapentin 07/13/2013  . DM (diabetes mellitus), type 2, uncontrolled, with renal complications ([place]) 70/12/7492  . DM2 (diabetes mellitus, type 2) ([place])   . Dysfunction of left eustachian tube, with pusatile tinnitus 11/24/2017  . Dyslipidemia associated with type 2 diabetes mellitus ([place]) 06/25/2016  . ESRD (end stage renal disease) on dialysis [facility])    On hemodialysis in July 2019 via [facility] then switched to [facility] in Nov 2019.   [name] ESRD with anemia ([place])   . Fibromyalgia   . Gastroparesis due to DM   . GERD (gastroesophageal reflux disease)   . Headache   . Hypertension associated with diabetes ([place]) 02/19/2011  . IBS (irritable bowel syndrome)   . Left-sided weakness 12/10/2017   .  [name] Leiomyoma of uterus   . Pancreatitis   . PE (pulmonary  thromboembolism) ([place]) 11/04/2019  . Pneumonia   . Proliferative diabetic retinopathy ([place]) 09/29/2015  . 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 Reactive depression 12/10/2017   .  [name] Right-sided low back pain without sciatica 12/08/2015  . Secondary hyperthyroidism 11/27/2013  . Steal syndrome dialysis vascular access ([place]) 02/17/2018  . Thromboembolism ([place]) 04/05/2018  . Upper airway cough syndrome, with recs to stay off ACE and take Pepcid q hs 11/15/2016    Past Surgical History:  Procedure Laterality Date  . A/V FISTULAGRAM Right 03/03/2020   Procedure: Venogram;  Surgeon: Waynetta Sandy, MD;  Location: [facility];  Service: Cardiovascular;  Laterality: Right;  . ARTERY REPAIR Right 02/17/2018   Procedure: EXPLORATION OF RIGHT BRACHIAL ARTERY;  Surgeon: Conrad [place], MD;  Location: [facility];  Service: Vascular;  Laterality: Right;  . ARTERY REPAIR Right 02/17/2018   Procedure: BRACHIAL ARTERY EXPLORATION AND TRHOMBECTOMY;  Surgeon: Conrad [name], MD;  Location: [facility] OR;  Service: Vascular;  Laterality: Right;  . AV FISTULA PLACEMENT Left 05/09/2017   Procedure: INSERTION OF ARTERIOVENOUS (AV) GRAFT ARM (ARTEGRAFT);  Surgeon: Conrad [place], MD;  Location: [facility] OR;  Service: Vascular;  Laterality: Left;  . AV FISTULA PLACEMENT Right 02/17/2018   Procedure: INSERTION OF ARTERIOVENOUS (AV) GORE-TEX GRAFT ARM RIGHT UPPER ARM;  Surgeon: Conrad , MD;  Location: [facility];  Service: Vascular;  Laterality: Right;  .  AV FISTULA PLACEMENT Left 10/10/2019   Procedure: INSERTION OF LEFT ARTERIOVENOUS (AV) ARTEGRAFT GRAFT ARM;  Surgeon: Waynetta Sandy, MD;  Location: [facility];  Service: Vascular;  Laterality: Left;  . AV FISTULA PLACEMENT Right 03/19/2020   Procedure: INSERTION OF ARTERIOVENOUS (AV) GORE-TEX GRAFT IN RIGHT ARM AND VIABAHN STENT IN RIGHT AXILLARY ARTERY;  Surgeon: Waynetta Sandy, MD;  Location: [facility];  Service: Vascular;  Laterality: Right;  .  BASCILIC VEIN TRANSPOSITION Left 08/31/2016   Procedure: BASILIC VEIN TRANSPOSITION FIRST STAGE;  Surgeon: Conrad Fidelis, MD;  Location: [facility];  Service: Vascular;  Laterality: Left;  . CENTRAL VENOUS CATHETER INSERTION Left 07/24/2018   Procedure: INSERTION CENTRAL LINE ADULT;  Surgeon: Conrad [place], MD;  Location: [facility];  Service: Vascular;  Laterality: Left;  . EYE SURGERY     secondary to diabetic retinopathy   . FOOT SURGERY Right    t"ook bone out- maybe hammer toe"  . INSERTION OF DIALYSIS CATHETER Left 07/24/2018   Procedure: INSERTION OF TUNNELED DIALYSIS CATHETER;  Surgeon: Conrad [place], MD;  Location: [facility];  Service: Vascular;  Laterality: Left;  . IR FLUORO GUIDE CV LINE RIGHT  07/21/2018  . IR FLUORO GUIDE CV LINE RIGHT  06/01/2019  . IR FLUORO GUIDE CV LINE RIGHT  05/05/2020  . IR US GUIDE VASC ACCESS RIGHT  07/21/2018  . IR US GUIDE VASC ACCESS RIGHT  06/01/2019  . LIGATION ARTERIOVENOUS GORTEX GRAFT Right 03/20/2020   Procedure: LIGATION ARM ARTERIOVENOUS GORTEX GRAFT With Patch Angioplasty, Thrombectomy;  Surgeon: Waynetta Sandy, MD;  Location: [facility];  Service: Vascular;  Laterality: Right;  . LIGATION OF ARTERIOVENOUS  FISTULA Left 05/09/2017   Procedure: LIGATION OF ARTERIOVENOUS  FISTULA;  Surgeon: Conrad [place], MD;  Location: [facility];  Service: Vascular;  Laterality: Left;  . LOOP RECORDER INSERTION N/A 12/05/2017   Procedure: LOOP RECORDER INSERTION;  Surgeon: Evans Lance, MD;  Location: [facility];  Service: Cardiovascular;  Laterality: N/A;  . MYOMECTOMY    . REMOVAL OF GRAFT Right 02/17/2018   Procedure: REMOVAL OF RIGHT UPPER ARM ARTERIOVENOUS GRAFT;  Surgeon: Conrad [place], MD;  Location: [facility];  Service: Vascular;  Laterality: Right;  . REVISION OF ARTERIOVENOUS GORETEX GRAFT Left 07/24/2018   Procedure: REDO ARTERIOVENOUS GORETEX GRAFT;  Surgeon: Conrad [place], MD;  Location: [facility];  Service: Vascular;  Laterality: Left;  . TEE WITHOUT CARDIOVERSION N/A  12/05/2017   Procedure: TRANSESOPHAGEAL ECHOCARDIOGRAM (TEE);  Surgeon: Sanda Klein, MD;  Location: [facility] ENDOSCOPY;  Service: Cardiovascular;  Laterality: N/A;  . UPPER EXTREMITY VENOGRAPHY Left 07/13/2018   Procedure: UPPER EXTREMITY VENOGRAPHY - Central & Left Arm;  Surgeon: Conrad , MD;  Location: [facility];  Service: Cardiovascular;  Laterality: Left;  . UPPER EXTREMITY VENOGRAPHY Bilateral 09/10/2019   Procedure: UPPER EXTREMITY VENOGRAPHY;  Surgeon: Waynetta Sandy, MD;  Location: [facility];  Service: Cardiovascular;  Laterality: Bilateral;  . UTERINE FIBROID SURGERY    . VITRECTOMY Bilateral     There were no vitals filed for this visit.   Subjective Assessment - 08/18/20 1757    Subjective Pt reports that she has been having stomach pain for last couple of days. pain is 8/10. Pt has not called [facility] yet.    Pertinent History CVA in 2018, type 2 diabetes, dyslipidemia, end-stage renal disease on hemodialysis, GERD, hypertension    Patient Stated Goals To get off walker so she  can get on transplant list.    Currently in Pain? Yes    Pain Score 8     Pain Location Other (Comment)   stomach   Pain Onset More than a month ago    Pain Frequency Constant                BP at start of the session 201/112, bp 95    SLR: 2 x 10 R and L SL hip abduction: 2 x 10 R and L Bridge: 2 x 10 SL clamshells: 2 x 10    Vitals after exercises: 150/86, 97 bpm  Gait training: 1 x 115' with quad cane and rollator follow   Vitals after walking: 165/98, 100bpm                PT Short Term Goals - 07/14/20 0818      PT SHORT TERM GOAL #1   Title Pt will be independent with initial HEP for strengtheining and balance to decrease fall risk.    Time 4    Period Weeks    Status New    Target Date 08/20/20   due to delay in scheduling     PT SHORT TERM GOAL #2   Title Pt will decrease 5 x sit to stand from 23. 67 sec to <20 sec from mat  with hands for improved balance and functional strength.    Baseline 23.67 sec from low mat with hands at eval 07/14/20    Time 4    Period Weeks    Status New    Target Date 08/20/20      PT SHORT TERM GOAL #3   Title Pt will be further assessed for possible foot up brace versus AFO for left foot to improve mobility.    Time 4    Period Weeks    Status New    Target Date 08/20/20      PT SHORT TERM GOAL #4   Title Pt will amubulate >400' on varied surfaces with rollator mod I for short community distances.    Time 4    Period Weeks    Status New    Target Date 08/20/20             PT Long Term Goals - 07/14/20 3419      PT LONG TERM GOAL #1   Title Pt will be independent with progressive HEP for balance and strength to continue gains on own.    Time 8    Period Weeks    Status New    Target Date 09/17/20      PT LONG TERM GOAL #2   Title Pt will increase Berg Balance from 21/56 to >27/56 for improved balance and decreased fall risk.    Baseline 21/56 on 07/14/20    Time 8    Period Weeks    Status New    Target Date 09/17/20      PT LONG TERM GOAL #3   Title Pt will increase gait speed from 0.70m/s to >0.17m/s for improved gait safety in the community.    Baseline 0.83m/s on 07/14/20    Time 8    Period Weeks    Status New    Target Date 09/17/20      PT LONG TERM GOAL #4   Title Pt will decrease TUG from 18 sec to <16 se for improved balance and functional mobility.    Baseline 18 sec on 07/14/20 with rollator  Time 8    Period Weeks    Status New    Target Date 09/17/20      PT LONG TERM GOAL #5   Title Pt will ambulate 250' with SPC versus no AD on level surfaces for improved mobility in home.    Time 8    Period Weeks    Status New    Target Date 09/17/20                 Plan - 08/18/20 1822    Clinical Impression Statement Today's treatment was limited due to elevated BP> patient was educated to follow up with her MD regarding elevated  BP. We focused on more bed exercises. We initiated gait training with quad cane as pt is not comfortable with str. cane yet. Pt was very anxious walking with quad cane and neede min A with sequencing with cane with tactile and verbal cueing.    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 Evolving/Moderate complexity    Rehab Potential Good    PT Frequency 2x / week    PT Duration 8 weeks    PT Treatment/Interventions ADLs/Self Care Home Management;DME Instruction;Gait training;Stair training;Functional mobility training;Neuromuscular re-education;Balance training;Therapeutic exercise;Therapeutic activities;Patient/family education;Orthotic Fit/Training;Manual techniques;Passive range of motion;Energy conservation    PT Next Visit Plan Issue medbridge HEP for sit to stands 5 x 2, clamshell x 10 and bridges x 10 to perform on nondialysis days as I had to give hand written copy since medbridge wasn't working the first treatment session. Did pt talk to [facility] and get set up yet? Continue gait training with left ottobock PLS AFO. Continue strengthening and start balance exercises. monitor vitals as needed.    Consulted and Agree with Plan of Care Patient           Patient will benefit from skilled therapeutic intervention in order to improve the following deficits and impairments:  Abnormal gait, Decreased activity tolerance, Decreased balance, Decreased endurance, Decreased knowledge of use of DME, Decreased mobility, Decreased range of motion, Difficulty walking, Decreased strength, Impaired sensation  Visit Diagnosis: Other abnormalities of gait and mobility  Muscle weakness  (generalized)  Unsteadiness on feet  Debility     Problem List Patient Active Problem List   Diagnosis Date Noted  . Positive ANA (antinuclear antibody) 04/16/2020  . ESRD on dialysis ([place])   . Hemiplegia and hemiparesis following cerebral infarction affecting left non-dominant side ([place]) 12/29/2018  . IBS (irritable bowel syndrome) 08/06/2017  . Dyslipidemia associated with type 2 diabetes mellitus ([place]) 06/25/2016  . Proliferative diabetic retinopathy ([place]) 09/29/2015  . B12 deficiency 05/10/2014  . Anemia in chronic kidney disease 04/26/2014  . CKD (chronic kidney disease) stage 5, GFR less than 15 ml/min ([facility]) 04/26/2014  . Secondary hyperparathyroidism of renal origin ([place]) 11/27/2013  . Posterior subcapsular cataract, bilateral 10/18/2013  . Diabetic neuropathy, painful ([place]), on low dose Gabapentin 07/13/2013  . Diabetes mellitus, type II, insulin dependent ([place]), on CGM, with end organ damage: renal, neuropathy, gastroparesis, retinopathy 02/19/2011  . Hypertension associated with diabetes ([place]) 02/19/2011  . Gastroparesis due to DM ([place]) 02/19/2011    Kerrie Pleasure, PT 08/18/2020, 6:41 PM  [facility] [address] [place], [place], [address] Phone: [phone]  Fax:  [phone]  Name: SHAMARI TROSTEL MRN: [id number] Date of Birth: 1971-03-09

## 2020-08-20 ENCOUNTER — Ambulatory Visit: Payer: Managed Care, Other (non HMO)

## 2020-08-25 ENCOUNTER — Ambulatory Visit: Payer: Managed Care, Other (non HMO)

## 2020-08-25 ENCOUNTER — Other Ambulatory Visit: Payer: Self-pay

## 2020-08-25 DIAGNOSIS — R2689 Other abnormalities of gait and mobility: Secondary | ICD-10-CM | POA: Diagnosis not present

## 2020-08-25 DIAGNOSIS — R5381 Other malaise: Secondary | ICD-10-CM

## 2020-08-25 DIAGNOSIS — M6281 Muscle weakness (generalized): Secondary | ICD-10-CM

## 2020-08-25 DIAGNOSIS — R2681 Unsteadiness on feet: Secondary | ICD-10-CM

## 2020-08-25 NOTE — Therapy (Signed)
[facility] [address] [place], [place], [address] Phone: [phone]   Fax:  [phone]  Physical Therapy Treatment  Patient Details  Name: Heather [name] MRN: [id number] Date of Birth: 1971/08/10 Referring Provider (PT): Dimas Chyle   Encounter Date: 08/25/2020   PT End of Session - 08/25/20 1805    Visit Number 5    Number of Visits 17    Date for PT Re-Evaluation 14/97/02   90 day cert, 60 day poc   PT Start Time 6378    PT Stop Time 5885    PT Time Calculation (min) 45 min    Equipment Utilized During Treatment Gait belt    Activity Tolerance Patient tolerated treatment well    Behavior During Therapy [facility] for tasks assessed/performed           Past Medical History:  Diagnosis Date  . Antiplatelet or antithrombotic long-term use: Plavix 06/30/2017  . B12 deficiency 05/10/2014  . Blood transfusion without reported diagnosis   . Chronic constipation   . CVA (cerebral vascular accident) ([place]), nonhemorrhagic, inferior right cerebellum 12/03/2017   left side weakness in leg  . Cyst of left ovary 01/12/2018  . Diabetic neuropathy, painful ([place]), on low dose Gabapentin 07/13/2013  . DM (diabetes mellitus), type 2, uncontrolled, with renal complications ([place]) 02/77/4128  . DM2 (diabetes mellitus, type 2) ([place])   . Dysfunction of left eustachian tube, with pusatile tinnitus 11/24/2017  . Dyslipidemia associated with type 2 diabetes mellitus ([place]) 06/25/2016  . ESRD (end stage renal disease) on dialysis [facility])    On hemodialysis in July 2019 via [facility] then switched to [facility] in Nov 2019.   [name] ESRD with anemia ([place])   . Fibromyalgia   . Gastroparesis due to DM   . GERD (gastroesophageal reflux disease)   . Headache   . Hypertension associated with diabetes ([place]) 02/19/2011  . IBS (irritable bowel syndrome)   . Left-sided weakness 12/10/2017   .  [name] Leiomyoma of uterus   . Pancreatitis   . PE (pulmonary  thromboembolism) ([place]) 11/04/2019  . Pneumonia   . Proliferative diabetic retinopathy ([place]) 09/29/2015  . 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 Reactive depression 12/10/2017   .  [name] Right-sided low back pain without sciatica 12/08/2015  . Secondary hyperthyroidism 11/27/2013  . Steal syndrome dialysis vascular access ([place]) 02/17/2018  . Thromboembolism ([place]) 04/05/2018  . Upper airway cough syndrome, with recs to stay off ACE and take Pepcid q hs 11/15/2016    Past Surgical History:  Procedure Laterality Date  . A/V FISTULAGRAM Right 03/03/2020   Procedure: Venogram;  Surgeon: Waynetta Sandy, MD;  Location: [place] [facility];  Service: Cardiovascular;  Laterality: Right;  . ARTERY REPAIR Right 02/17/2018   Procedure: EXPLORATION OF RIGHT BRACHIAL ARTERY;  Surgeon: Conrad Milford, MD;  Location: [place];  Service: Vascular;  Laterality: Right;  . ARTERY REPAIR Right 02/17/2018   Procedure: BRACHIAL ARTERY EXPLORATION AND TRHOMBECTOMY;  Surgeon: Conrad Waverly, MD;  Location[facility] OR;  Service: Vascular;  Laterality: Right;  . AV FISTULA PLACEMENT Left 05/09/2017   Procedure: INSERTION OF ARTERIOVENOUS (AV) GRAFT ARM (ARTEGRAFT);  Surgeon: Conrad Kusilvak, MD;  Location: [facility] OR;  Service: Vascular;  Laterality: Left;  . AV FISTULA PLACEMENT Right 02/17/2018   Procedure: INSERTION OF ARTERIOVENOUS (AV) GORE-TEX GRAFT ARM RIGHT UPPER ARM;  Surgeon: Conrad Carteret, MD;  Location: [place];  Service: Vascular;  Laterality: Right;  .  AV FISTULA PLACEMENT Left 10/10/2019   Procedure: INSERTION OF LEFT ARTERIOVENOUS (AV) ARTEGRAFT GRAFT ARM;  Surgeon: Waynetta Sandy, MD;  Location: [place];  Service: Vascular;  Laterality: Left;  . AV FISTULA PLACEMENT Right 03/19/2020   Procedure: INSERTION OF ARTERIOVENOUS (AV) GORE-TEX GRAFT IN RIGHT ARM AND VIABAHN STENT IN RIGHT AXILLARY ARTERY;  Surgeon: Waynetta Sandy, MD;  Location: [place];  Service: Vascular;  Laterality: Right;  .  BASCILIC VEIN TRANSPOSITION Left 08/31/2016   Procedure: BASILIC VEIN TRANSPOSITION FIRST STAGE;  Surgeon: Conrad [place], MD;  Location: [place];  Service: Vascular;  Laterality: Left;  . CENTRAL VENOUS CATHETER INSERTION Left 07/24/2018   Procedure: INSERTION CENTRAL LINE ADULT;  Surgeon: Conrad [place], MD;  Location: [place];  Service: Vascular;  Laterality: Left;  . EYE SURGERY     secondary to diabetic retinopathy   . FOOT SURGERY Right    t"ook bone out- maybe hammer toe"  . INSERTION OF DIALYSIS CATHETER Left 07/24/2018   Procedure: INSERTION OF TUNNELED DIALYSIS CATHETER;  Surgeon: Conrad [place], MD;  Location: [place];  Service: Vascular;  Laterality: Left;  . IR FLUORO GUIDE CV LINE RIGHT  07/21/2018  . IR FLUORO GUIDE CV LINE RIGHT  06/01/2019  . IR FLUORO GUIDE CV LINE RIGHT  05/05/2020  . IR US GUIDE VASC ACCESS RIGHT  07/21/2018  . IR US GUIDE VASC ACCESS RIGHT  06/01/2019  . LIGATION ARTERIOVENOUS GORTEX GRAFT Right 03/20/2020   Procedure: LIGATION ARM ARTERIOVENOUS GORTEX GRAFT With Patch Angioplasty, Thrombectomy;  Surgeon: Waynetta Sandy, MD;  Location: [place];  Service: Vascular;  Laterality: Right;  . LIGATION OF ARTERIOVENOUS  FISTULA Left 05/09/2017   Procedure: LIGATION OF ARTERIOVENOUS  FISTULA;  Surgeon: Conrad [place], MD;  Location: [place];  Service: Vascular;  Laterality: Left;  . LOOP RECORDER INSERTION N/A 12/05/2017   Procedure: LOOP RECORDER INSERTION;  Surgeon: Evans Lance, MD;  Location: [place] [facility];  Service: Cardiovascular;  Laterality: N/A;  . MYOMECTOMY    . REMOVAL OF GRAFT Right 02/17/2018   Procedure: REMOVAL OF RIGHT UPPER ARM ARTERIOVENOUS GRAFT;  Surgeon: Conrad Rothschild, MD;  Location: [place];  Service: Vascular;  Laterality: Right;  . REVISION OF ARTERIOVENOUS GORETEX GRAFT Left 07/24/2018   Procedure: REDO ARTERIOVENOUS GORETEX GRAFT;  Surgeon: Conrad [place], MD;  Location: [place];  Service: Vascular;  Laterality: Left;  . TEE WITHOUT CARDIOVERSION N/A  12/05/2017   Procedure: TRANSESOPHAGEAL ECHOCARDIOGRAM (TEE);  Surgeon: Sanda Klein, MD;  Location: [facility] ENDOSCOPY;  Service: Cardiovascular;  Laterality: N/A;  . UPPER EXTREMITY VENOGRAPHY Left 07/13/2018   Procedure: UPPER EXTREMITY VENOGRAPHY - Central & Left Arm;  Surgeon: Conrad Williamson, MD;  Location: [place] [facility];  Service: Cardiovascular;  Laterality: Left;  . UPPER EXTREMITY VENOGRAPHY Bilateral 09/10/2019   Procedure: UPPER EXTREMITY VENOGRAPHY;  Surgeon: Waynetta Sandy, MD;  Location: [place] [facility];  Service: Cardiovascular;  Laterality: Bilateral;  . UTERINE FIBROID SURGERY    . VITRECTOMY Bilateral     There were no vitals filed for this visit.   Subjective Assessment - 08/25/20 1805    Subjective MD has changed her BP meds. It only has been 1.5 days and she can't take BP meds on her dialysis days.    Pertinent History CVA in 2018, type 2 diabetes, dyslipidemia, end-stage renal disease on hemodialysis, GERD, hypertension    Patient Stated Goals To get off walker so she can  get on transplant list.    Pain Onset More than a month ago              [facility] PT Assessment - 08/25/20 0001      ROM / Strength   AROM / PROM / Strength Strength      Strength   Right Ankle Dorsiflexion 2-/5    Right Ankle Plantar Flexion 3+/5    Right Ankle Inversion 2+/5    Right Ankle Eversion 2+/5    Left Ankle Dorsiflexion 2-/5    Left Ankle Plantar Flexion 3+/5    Left Ankle Inversion 2-/5    Left Ankle Eversion 2-/5               Vitals at start of the session: BP 202/109, 98 HR Nustep: level 4 for 10' Vitals after 2 min rest: BP 204/110, 96bpm Manually stretched bil gastroc/soleus Supine hooklying: AROM of DF: 20x; manually resisted plantarflexion: 20x R and L Supine hooklying: Gravity eliminated ankle inversion and eversion: manually stabilized ankle to allow ankle to roll: 20x R and L Gait training: 1 x 115' Vitals at end of the session after gait:  180/102, 98                    PT Short Term Goals - 07/14/20 0818      PT SHORT TERM GOAL #1   Title Pt will be independent with initial HEP for strengtheining and balance to decrease fall risk.    Time 4    Period Weeks    Status New    Target Date 08/20/20   due to delay in scheduling     PT SHORT TERM GOAL #2   Title Pt will decrease 5 x sit to stand from 23. 67 sec to <20 sec from mat with hands for improved balance and functional strength.    Baseline 23.67 sec from low mat with hands at eval 07/14/20    Time 4    Period Weeks    Status New    Target Date 08/20/20      PT SHORT TERM GOAL #3   Title Pt will be further assessed for possible foot up brace versus AFO for left foot to improve mobility.    Time 4    Period Weeks    Status New    Target Date 08/20/20      PT SHORT TERM GOAL #4   Title Pt will amubulate >400' on varied surfaces with rollator mod I for short community distances.    Time 4    Period Weeks    Status New    Target Date 08/20/20             PT Long Term Goals - 07/14/20 0272      PT LONG TERM GOAL #1   Title Pt will be independent with progressive HEP for balance and strength to continue gains on own.    Time 8    Period Weeks    Status New    Target Date 09/17/20      PT LONG TERM GOAL #2   Title Pt will increase Berg Balance from 21/56 to >27/56 for improved balance and decreased fall risk.    Baseline 21/56 on 07/14/20    Time 8    Period Weeks    Status New    Target Date 09/17/20      PT LONG TERM GOAL #3   Title Pt will increase gait  speed from 0.43m/s to >0.44m/s for improved gait safety in the community.    Baseline 0.55m/s on 07/14/20    Time 8    Period Weeks    Status New    Target Date 09/17/20      PT LONG TERM GOAL #4   Title Pt will decrease TUG from 18 sec to <16 se for improved balance and functional mobility.    Baseline 18 sec on 07/14/20 with rollator    Time 8    Period Weeks    Status New     Target Date 09/17/20      PT LONG TERM GOAL #5   Title Pt will ambulate 250' with SPC versus no AD on level surfaces for improved mobility in home.    Time 8    Period Weeks    Status New    Target Date 09/17/20                 Plan - 08/25/20 1833    Clinical Impression Statement Patient continues to have high BP which is limiting her activity level. Patient demonstrates decrease in her systolic BP after a walk. Patient demonstrates significant weakness in bil ankles.    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 Evolving/Moderate complexity    Rehab Potential Good    PT Frequency 2x / week    PT Duration 8 weeks    PT Treatment/Interventions ADLs/Self Care Home Management;DME Instruction;Gait training;Stair training;Functional mobility training;Neuromuscular re-education;Balance training;Therapeutic exercise;Therapeutic activities;Patient/family education;Orthotic Fit/Training;Manual techniques;Passive range of motion;Energy conservation    PT Next Visit Plan Issue medbridge HEP for sit to stands 5 x 2, clamshell x 10 and bridges x 10 to perform on nondialysis days as I had to give hand written copy since medbridge wasn't working the first treatment session. Did pt talk to Leonard and get set up yet? Continue gait training with left ottobock PLS AFO. Continue strengthening and start balance exercises. monitor vitals as needed.    Consulted and Agree with Plan of Care Patient           Patient will benefit from skilled therapeutic intervention in order to improve the following deficits and impairments:  Abnormal gait, Decreased activity tolerance, Decreased balance,  Decreased endurance, Decreased knowledge of use of DME, Decreased mobility, Decreased range of motion, Difficulty walking, Decreased strength, Impaired sensation  Visit Diagnosis: Other abnormalities of gait and mobility  Muscle weakness (generalized)  Unsteadiness on feet  Debility     Problem List Patient Active Problem List   Diagnosis Date Noted  . Positive ANA (antinuclear antibody) 04/16/2020  . ESRD on dialysis ([place])   . Hemiplegia and hemiparesis following cerebral infarction affecting left non-dominant side ([place]) 12/29/2018  . IBS (irritable bowel syndrome) 08/06/2017  . Dyslipidemia associated with type 2 diabetes mellitus ([place]) 06/25/2016  . Proliferative diabetic retinopathy ([place]) 09/29/2015  . B12 deficiency 05/10/2014  . Anemia in chronic kidney disease 04/26/2014  . CKD (chronic kidney disease) stage 5, GFR less than 15 ml/min ([facility]) 04/26/2014  . Secondary hyperparathyroidism of renal origin ([place]) 11/27/2013  . Posterior subcapsular cataract, bilateral 10/18/2013  . Diabetic neuropathy, painful ([place]), on low dose Gabapentin 07/13/2013  . Diabetes mellitus, type II, insulin dependent ([place]), on CGM, with end organ damage: renal, neuropathy,  gastroparesis, retinopathy 02/19/2011  . Hypertension associated with diabetes ([place]) 02/19/2011  . Gastroparesis due to DM [facility]) 02/19/2011    Kerrie Pleasure 08/25/2020, 6:38 PM  [place] [address] [place], [place], [address] Phone: [phone]   Fax:  [phone]  Name: NAMIAH DUNNAVANT MRN: [id number] Date of Birth: 21-Oct-1971

## 2020-08-27 ENCOUNTER — Ambulatory Visit: Payer: Managed Care, Other (non HMO) | Attending: Family Medicine

## 2020-08-27 ENCOUNTER — Other Ambulatory Visit: Payer: Self-pay

## 2020-08-27 ENCOUNTER — Telehealth: Payer: Self-pay

## 2020-08-27 VITALS — BP 180/104

## 2020-08-27 DIAGNOSIS — R2689 Other abnormalities of gait and mobility: Secondary | ICD-10-CM

## 2020-08-27 DIAGNOSIS — R2681 Unsteadiness on feet: Secondary | ICD-10-CM | POA: Insufficient documentation

## 2020-08-27 DIAGNOSIS — M6281 Muscle weakness (generalized): Secondary | ICD-10-CM | POA: Insufficient documentation

## 2020-08-27 DIAGNOSIS — R5381 Other malaise: Secondary | ICD-10-CM | POA: Insufficient documentation

## 2020-08-27 NOTE — Telephone Encounter (Signed)
Dr. Jerline Pain and Dr. Augustin Coupe, I have been working with Heather [name] in PT but we have been very limited due to her extremely elevated BP readings the last few weeks. A couple readings were as follows:  08/25/20 202/109 08/27/20 180/104 I know that her amlodipine was just increased recently. I have instructed her to check prior to coming to therapy and if high to not come. Wanted to keep you aware so you could decide if any further adjustments need to be made. Thanks so much for your help. Cherly Anderson, PT, DPT, NCS

## 2020-08-27 NOTE — Therapy (Signed)
[facility] [address] [place], [place], [address] Phone: [phone]   Fax:  [phone]  Physical Therapy Treatment- arrived no charge  Patient Details  Name: Heather [name] MRN: [id number] Date of Birth: 1971-04-15 Referring Provider (PT): Dimas Chyle   Encounter Date: 08/27/2020   PT End of Session - 08/27/20 1752    Visit Number 5    Number of Visits 17    Date for PT Re-Evaluation 87/68/11   90 day cert, 60 day poc   PT Start Time 5726    PT Stop Time 1802   arrived no charge   PT Time Calculation (min) 12 min    Equipment Utilized During Treatment Gait belt    Activity Tolerance Patient tolerated treatment well    Behavior During Therapy [facility] for tasks assessed/performed           Past Medical History:  Diagnosis Date  . Antiplatelet or antithrombotic long-term use: Plavix 06/30/2017  . B12 deficiency 05/10/2014  . Blood transfusion without reported diagnosis   . Chronic constipation   . CVA (cerebral vascular accident) ([place]), nonhemorrhagic, inferior right cerebellum 12/03/2017   left side weakness in leg  . Cyst of left ovary 01/12/2018  . Diabetic neuropathy, painful ([place]), on low dose Gabapentin 07/13/2013  . DM (diabetes mellitus), type 2, uncontrolled, with renal complications ([place]) 20/35/5974  . DM2 (diabetes mellitus, type 2) ([place])   . Dysfunction of left eustachian tube, with pusatile tinnitus 11/24/2017  . Dyslipidemia associated with type 2 diabetes mellitus ([place]) 06/25/2016  . ESRD (end stage renal disease) on dialysis [name])    On hemodialysis in July 2019 via [facility] then switched to [facility] in Nov 2019.   [name] ESRD with anemia ([place])   . Fibromyalgia   . Gastroparesis due to DM   . GERD (gastroesophageal reflux disease)   . Headache   . Hypertension associated with diabetes ([place]) 02/19/2011  . IBS (irritable bowel syndrome)   . Left-sided weakness 12/10/2017   .  [name] Leiomyoma of uterus   .  Pancreatitis   . PE (pulmonary thromboembolism) ([place]) 11/04/2019  . Pneumonia   . Proliferative diabetic retinopathy ([place]) 09/29/2015  . 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 Reactive depression 12/10/2017   .  [name] Right-sided low back pain without sciatica 12/08/2015  . Secondary hyperthyroidism 11/27/2013  . Steal syndrome dialysis vascular access ([place]) 02/17/2018  . Thromboembolism ([place]) 04/05/2018  . Upper airway cough syndrome, with recs to stay off ACE and take Pepcid q hs 11/15/2016    Past Surgical History:  Procedure Laterality Date  . A/V FISTULAGRAM Right 03/03/2020   Procedure: Venogram;  Surgeon: Waynetta Sandy, MD;  Location: [place];  Service: Cardiovascular;  Laterality: Right;  . ARTERY REPAIR Right 02/17/2018   Procedure: EXPLORATION OF RIGHT BRACHIAL ARTERY;  Surgeon: Conrad Bixby, MD;  Location: [place];  Service: Vascular;  Laterality: Right;  . ARTERY REPAIR Right 02/17/2018   Procedure: BRACHIAL ARTERY EXPLORATION AND TRHOMBECTOMY;  Surgeon: Conrad [place], MD;  Location: [facility] OR;  Service: Vascular;  Laterality: Right;  . AV FISTULA PLACEMENT Left 05/09/2017   Procedure: INSERTION OF ARTERIOVENOUS (AV) GRAFT ARM (ARTEGRAFT);  Surgeon: Conrad [place], MD;  Location: [facility] OR;  Service: Vascular;  Laterality: Left;  . AV FISTULA PLACEMENT Right 02/17/2018   Procedure: INSERTION OF ARTERIOVENOUS (AV) GORE-TEX GRAFT ARM RIGHT UPPER ARM;  Surgeon: Conrad , MD;  Location: [facility];  Service: Vascular;  Laterality: Right;  . AV FISTULA PLACEMENT Left 10/10/2019   Procedure: INSERTION OF LEFT ARTERIOVENOUS (AV) ARTEGRAFT GRAFT ARM;  Surgeon: Waynetta Sandy, MD;  Location: [place] [facility];  Service: Vascular;  Laterality: Left;  . AV FISTULA PLACEMENT Right 03/19/2020   Procedure: INSERTION OF ARTERIOVENOUS (AV) GORE-TEX GRAFT IN RIGHT ARM AND VIABAHN STENT IN RIGHT AXILLARY ARTERY;  Surgeon: Waynetta Sandy, MD;  Location: [facility];  Service:  Vascular;  Laterality: Right;  . BASCILIC VEIN TRANSPOSITION Left 08/31/2016   Procedure: BASILIC VEIN TRANSPOSITION FIRST STAGE;  Surgeon: Conrad [place], MD;  Location: [facility];  Service: Vascular;  Laterality: Left;  . CENTRAL VENOUS CATHETER INSERTION Left 07/24/2018   Procedure: INSERTION CENTRAL LINE ADULT;  Surgeon: Conrad Briggs, MD;  Location: [facility] [place];  Service: Vascular;  Laterality: Left;  . EYE SURGERY     secondary to diabetic retinopathy   . FOOT SURGERY Right    t"ook bone out- maybe hammer toe"  . INSERTION OF DIALYSIS CATHETER Left 07/24/2018   Procedure: INSERTION OF TUNNELED DIALYSIS CATHETER;  Surgeon: Conrad [place]burg, MD;  Location: [facility];  Service: Vascular;  Laterality: Left;  . IR FLUORO GUIDE CV LINE RIGHT  07/21/2018  . IR FLUORO GUIDE CV LINE RIGHT  06/01/2019  . IR FLUORO GUIDE CV LINE RIGHT  05/05/2020  . IR US GUIDE VASC ACCESS RIGHT  07/21/2018  . IR US GUIDE VASC ACCESS RIGHT  06/01/2019  . LIGATION ARTERIOVENOUS GORTEX GRAFT Right 03/20/2020   Procedure: LIGATION ARM ARTERIOVENOUS GORTEX GRAFT With Patch Angioplasty, Thrombectomy;  Surgeon: Waynetta Sandy, MD;  Location: [facility];  Service: Vascular;  Laterality: Right;  . LIGATION OF ARTERIOVENOUS  FISTULA Left 05/09/2017   Procedure: LIGATION OF ARTERIOVENOUS  FISTULA;  Surgeon: Conrad Heidelberg, MD;  Location: [facility];  Service: Vascular;  Laterality: Left;  . LOOP RECORDER INSERTION N/A 12/05/2017   Procedure: LOOP RECORDER INSERTION;  Surgeon: Evans Lance, MD;  Location: [place] [facility];  Service: Cardiovascular;  Laterality: N/A;  . MYOMECTOMY    . REMOVAL OF GRAFT Right 02/17/2018   Procedure: REMOVAL OF RIGHT UPPER ARM ARTERIOVENOUS GRAFT;  Surgeon: Conrad [place], MD;  Location: [place];  Service: Vascular;  Laterality: Right;  . REVISION OF ARTERIOVENOUS GORETEX GRAFT Left 07/24/2018   Procedure: REDO ARTERIOVENOUS GORETEX GRAFT;  Surgeon: Conrad Arbutus, MD;  Location: [facility];  Service: Vascular;  Laterality: Left;   . TEE WITHOUT CARDIOVERSION N/A 12/05/2017   Procedure: TRANSESOPHAGEAL ECHOCARDIOGRAM (TEE);  Surgeon: Sanda Klein, MD;  Location: [facility] ENDOSCOPY;  Service: Cardiovascular;  Laterality: N/A;  . UPPER EXTREMITY VENOGRAPHY Left 07/13/2018   Procedure: UPPER EXTREMITY VENOGRAPHY - Central & Left Arm;  Surgeon: Conrad [place], MD;  Location: [place] [facility];  Service: Cardiovascular;  Laterality: Left;  . UPPER EXTREMITY VENOGRAPHY Bilateral 09/10/2019   Procedure: UPPER EXTREMITY VENOGRAPHY;  Surgeon: Waynetta Sandy, MD;  Location: [place] [facility];  Service: Cardiovascular;  Laterality: Bilateral;  . UTERINE FIBROID SURGERY    . VITRECTOMY Bilateral     Vitals:   08/27/20 1758  BP: (!) 180/104     Subjective Assessment - 08/27/20 1752    Subjective Pt reports that she tried to call [facility] but they did not have the order. Pt's kidney doctor handles her BP meds. Reports they just increased amlodipine to 10mg .    Pertinent History CVA in 2018, type 2 diabetes, dyslipidemia, end-stage renal disease  on hemodialysis, GERD, hypertension    Patient Stated Goals To get off walker so she can get on transplant list.    Currently in Pain? No/denies    Pain Onset More than a month ago                                     PT Education - 08/27/20 1817    Education Details Pt was instructed to check BP prior to coming to session and if higher than 170/90 to call and not come.    Person(s) Educated Patient;Spouse    Methods Explanation    Comprehension Verbalized understanding            PT Short Term Goals - 07/14/20 0818      PT SHORT TERM GOAL #1   Title Pt will be independent with initial HEP for strengtheining and balance to decrease fall risk.    Time 4    Period Weeks    Status New    Target Date 08/20/20   due to delay in scheduling     PT SHORT TERM GOAL #2   Title Pt will decrease 5 x sit to stand from 23. 67 sec to <20 sec from mat with  hands for improved balance and functional strength.    Baseline 23.67 sec from low mat with hands at eval 07/14/20    Time 4    Period Weeks    Status New    Target Date 08/20/20      PT SHORT TERM GOAL #3   Title Pt will be further assessed for possible foot up brace versus AFO for left foot to improve mobility.    Time 4    Period Weeks    Status New    Target Date 08/20/20      PT SHORT TERM GOAL #4   Title Pt will amubulate >400' on varied surfaces with rollator mod I for short community distances.    Time 4    Period Weeks    Status New    Target Date 08/20/20             PT Long Term Goals - 07/14/20 5784      PT LONG TERM GOAL #1   Title Pt will be independent with progressive HEP for balance and strength to continue gains on own.    Time 8    Period Weeks    Status New    Target Date 09/17/20      PT LONG TERM GOAL #2   Title Pt will increase Berg Balance from 21/56 to >27/56 for improved balance and decreased fall risk.    Baseline 21/56 on 07/14/20    Time 8    Period Weeks    Status New    Target Date 09/17/20      PT LONG TERM GOAL #3   Title Pt will increase gait speed from 0.61m/s to >0.36m/s for improved gait safety in the community.    Baseline 0.88m/s on 07/14/20    Time 8    Period Weeks    Status New    Target Date 09/17/20      PT LONG TERM GOAL #4   Title Pt will decrease TUG from 18 sec to <16 se for improved balance and functional mobility.    Baseline 18 sec on 07/14/20 with rollator    Time 8  Period Weeks    Status New    Target Date 09/17/20      PT LONG TERM GOAL #5   Title Pt will ambulate 250' with SPC versus no AD on level surfaces for improved mobility in home.    Time 8    Period Weeks    Status New    Target Date 09/17/20                 Plan - 08/27/20 1811    Clinical Impression Statement Pt continues to have elevated BP unsafe to participate in exercise. MD did increase BP med dose. PT will fax Dr. Jerline Pain  and Dr. Augustin Coupe about continued issues with BP. Pt reported to therapist that she did call [facility] but they did not have order about AFO. PT will refax as had faxed originally on 08/05/20.    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 Has been extremely high. Did she hear from [facility]? PT was going to refax order and call. Continue strengthening and start balance as able.    Consulted and Agree with Plan of Care Patient           Patient will benefit from skilled therapeutic intervention in order to improve the following deficits and impairments:  Abnormal gait, Decreased activity tolerance, Decreased balance, Decreased endurance, Decreased knowledge of use of DME, Decreased mobility, Decreased range of motion, Difficulty walking, Decreased strength, Impaired sensation  Visit Diagnosis: Other abnormalities of gait and mobility     Problem List Patient Active Problem List   Diagnosis Date Noted  . Positive ANA (antinuclear antibody) 04/16/2020  . ESRD on dialysis ([place])   . Hemiplegia and hemiparesis following cerebral infarction affecting left non-dominant side ([place]) 12/29/2018  . IBS (irritable bowel syndrome) 08/06/2017  .  Dyslipidemia associated with type 2 diabetes mellitus ([place]) 06/25/2016  . Proliferative diabetic retinopathy ([place]) 09/29/2015  . B12 deficiency 05/10/2014  . Anemia in chronic kidney disease 04/26/2014  . CKD (chronic kidney disease) stage 5, GFR less than 15 ml/min ([facility]) 04/26/2014  . Secondary hyperparathyroidism of renal origin ([place]) 11/27/2013  . Posterior subcapsular cataract, bilateral 10/18/2013  . Diabetic neuropathy, painful ([place]), on low dose Gabapentin 07/13/2013  . Diabetes mellitus, type II, insulin dependent ([place]), on CGM, with end organ damage: renal, neuropathy, gastroparesis, retinopathy 02/19/2011  . Hypertension associated with diabetes ([place]) 02/19/2011  . Gastroparesis due to DM ([place]) 02/19/2011    Electa Sniff, PT, DPT, NCS 08/27/2020, 6:20 PM  [facility] [address] [place], [place], [address] Phone: [phone]   Fax:  [phone]  Name: Heather [name] MRN: [id number] Date of Birth: December 20, 1971

## 2020-09-03 ENCOUNTER — Ambulatory Visit: Payer: Managed Care, Other (non HMO)

## 2020-09-05 ENCOUNTER — Ambulatory Visit: Payer: Managed Care, Other (non HMO)

## 2020-09-05 NOTE — Progress Notes (Deleted)
HISTORY AND PHYSICAL     CC:  dialysis access Requesting Provider:  [name], MD  HPI: This is a 49 y.o. female here for evaluation hemodialysis access.  [name] here for follow up after having right hand dysfunction after AVG placement.   She did have palpable pulses afterwards but had significant dysfunction of the hand the graft was ligated and the artery was patched on 03/20/2020 by Dr. Donzetta Matters.  She was last seen in June 2021 at which time, she did have some discoloration of the upper arm at the site of the graft.  She had some improvement in her ability to write but overall, she could not feel most of the lateral aspect of her hand and could not make a good fist and continued to have significant dysfunction.  She was working with OT.  She was dialyzing via [place].  She did have a wound on the left foot that had healed and he felt she would be high risk for any access procedures at that time.  She was scheduled to come back for a 3 month follow up.    Access Hx: -RUA AVG 02/17/2018 ([facility]) -removal of RUA AVG for steal 02/17/18 ([facility]) -thrombectomy right brachial, radial and ulnar arteries 02/17/18 ([facility]) -left arm venogram 07/13/18 ([facility]) -LUA AVG & left femoral TDC 07/24/18 ([facility]) -left arm venogram 09/10/2019 ([facility]) -redo LUA AVG with Artegraft 10/10/2019 ([facility]) -right arm venogram 03/03/2020 ([facility]) -RUA AVG (PTFE) 3/24/202 ([facility]) -ligation right arm AVG, RUE thrombectomy with patch angioplasty 03/20/2020 ([facility])  ***  The pt is *** hand dominant.    Pt is on dialysis.   Days of dialysis if applicable:  ***    HD center if applicable:  *** St location.   The pt is not on a statin for cholesterol management.  The pt is not on a daily aspirin.  Other AC:  eliquis  The pt is not on medication for hypertension.  The pt is diabetic.   Tobacco hx:  never  Past Medical History:  Diagnosis Date  . Antiplatelet or antithrombotic long-term use: Plavix 06/30/2017  . B12 deficiency 05/10/2014  . Blood  transfusion without reported diagnosis   . Chronic constipation   . CVA (cerebral vascular accident) ([place]), nonhemorrhagic, inferior right cerebellum 12/03/2017   left side weakness in leg  . Cyst of left ovary 01/12/2018  . Diabetic neuropathy, painful ([place]), on low dose Gabapentin 07/13/2013  . DM (diabetes mellitus), type 2, uncontrolled, with renal complications ([place]) 04/54/0981  . DM2 (diabetes mellitus, type 2) ([place])   . Dysfunction of left eustachian tube, with pusatile tinnitus 11/24/2017  . Dyslipidemia associated with type 2 diabetes mellitus ([place]) 06/25/2016  . ESRD (end stage renal disease) on dialysis [facility])    On hemodialysis in July 2019 via [facility] then switched to [facility] in Nov 2019.   [name] ESRD with anemia ([place])   . Fibromyalgia   . Gastroparesis due to DM   . GERD (gastroesophageal reflux disease)   . Headache   . Hypertension associated with diabetes ([place]) 02/19/2011  . IBS (irritable bowel syndrome)   . Left-sided weakness 12/10/2017   .  [name] Leiomyoma of uterus   . Pancreatitis   . PE (pulmonary thromboembolism) ([place]) 11/04/2019  . Pneumonia   . Proliferative diabetic retinopathy ([place]) 09/29/2015  . 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 Reactive depression 12/10/2017   .  [name] Right-sided low back pain without sciatica 12/08/2015  . Secondary hyperthyroidism 11/27/2013  .  Steal syndrome dialysis vascular access ([place]) 02/17/2018  . Thromboembolism ([place]) 04/05/2018  . Upper airway cough syndrome, with recs to stay off ACE and take Pepcid q hs 11/15/2016    Past Surgical History:  Procedure Laterality Date  . A/V FISTULAGRAM Right 03/03/2020   Procedure: Venogram;  Surgeon: Waynetta Sandy, MD;  Location: [facility];  Service: Cardiovascular;  Laterality: Right;  . ARTERY REPAIR Right 02/17/2018   Procedure: EXPLORATION OF RIGHT BRACHIAL ARTERY;  Surgeon: Conrad [place], MD;  Location: [place];  Service: Vascular;  Laterality: Right;  . ARTERY REPAIR Right  02/17/2018   Procedure: BRACHIAL ARTERY EXPLORATION AND TRHOMBECTOMY;  Surgeon: Conrad Juniata, MD;  Location: [facility] OR;  Service: Vascular;  Laterality: Right;  . AV FISTULA PLACEMENT Left 05/09/2017   Procedure: INSERTION OF ARTERIOVENOUS (AV) GRAFT ARM (ARTEGRAFT);  Surgeon: Conrad Gardena, MD;  Location: [facility] OR;  Service: Vascular;  Laterality: Left;  . AV FISTULA PLACEMENT Right 02/17/2018   Procedure: INSERTION OF ARTERIOVENOUS (AV) GORE-TEX GRAFT ARM RIGHT UPPER ARM;  Surgeon: Conrad [place], MD;  Location: [place];  Service: Vascular;  Laterality: Right;  . AV FISTULA PLACEMENT Left 10/10/2019   Procedure: INSERTION OF LEFT ARTERIOVENOUS (AV) ARTEGRAFT GRAFT ARM;  Surgeon: Waynetta Sandy, MD;  Location: [place];  Service: Vascular;  Laterality: Left;  . AV FISTULA PLACEMENT Right 03/19/2020   Procedure: INSERTION OF ARTERIOVENOUS (AV) GORE-TEX GRAFT IN RIGHT ARM AND VIABAHN STENT IN RIGHT AXILLARY ARTERY;  Surgeon: Waynetta Sandy, MD;  Location: [place];  Service: Vascular;  Laterality: Right;  . BASCILIC VEIN TRANSPOSITION Left 08/31/2016   Procedure: BASILIC VEIN TRANSPOSITION FIRST STAGE;  Surgeon: Conrad Crivitz, MD;  Location: [place];  Service: Vascular;  Laterality: Left;  . CENTRAL VENOUS CATHETER INSERTION Left 07/24/2018   Procedure: INSERTION CENTRAL LINE ADULT;  Surgeon: Conrad [place], MD;  Location: [place];  Service: Vascular;  Laterality: Left;  . EYE SURGERY     secondary to diabetic retinopathy   . FOOT SURGERY Right    t"ook bone out- maybe hammer toe"  . INSERTION OF DIALYSIS CATHETER Left 07/24/2018   Procedure: INSERTION OF TUNNELED DIALYSIS CATHETER;  Surgeon: Conrad [place], MD;  Location: [place];  Service: Vascular;  Laterality: Left;  . IR FLUORO GUIDE CV LINE RIGHT  07/21/2018  . IR FLUORO GUIDE CV LINE RIGHT  06/01/2019  . IR FLUORO GUIDE CV LINE RIGHT  05/05/2020  . IR US GUIDE VASC ACCESS RIGHT  07/21/2018  . IR US GUIDE VASC ACCESS RIGHT  06/01/2019  . LIGATION  ARTERIOVENOUS GORTEX GRAFT Right 03/20/2020   Procedure: LIGATION ARM ARTERIOVENOUS GORTEX GRAFT With Patch Angioplasty, Thrombectomy;  Surgeon: Waynetta Sandy, MD;  Location: [place];  Service: Vascular;  Laterality: Right;  . LIGATION OF ARTERIOVENOUS  FISTULA Left 05/09/2017   Procedure: LIGATION OF ARTERIOVENOUS  FISTULA;  Surgeon: Conrad [place], MD;  Location: [place];  Service: Vascular;  Laterality: Left;  . LOOP RECORDER INSERTION N/A 12/05/2017   Procedure: LOOP RECORDER INSERTION;  Surgeon: Evans Lance, MD;  Location: [place] [facility];  Service: Cardiovascular;  Laterality: N/A;  . MYOMECTOMY    . REMOVAL OF GRAFT Right 02/17/2018   Procedure: REMOVAL OF RIGHT UPPER ARM ARTERIOVENOUS GRAFT;  Surgeon: Conrad [place]en Bay, MD;  Location: [place];  Service: Vascular;  Laterality: Right;  . REVISION OF ARTERIOVENOUS GORETEX GRAFT Left 07/24/2018   Procedure: REDO ARTERIOVENOUS GORETEX GRAFT;  Surgeon: Conrad [place], MD;  Location: [place];  Service: Vascular;  Laterality: Left;  . TEE WITHOUT CARDIOVERSION N/A 12/05/2017   Procedure: TRANSESOPHAGEAL ECHOCARDIOGRAM (TEE);  Surgeon: Sanda Klein, MD;  Location: [facility] ENDOSCOPY;  Service: Cardiovascular;  Laterality: N/A;  . UPPER EXTREMITY VENOGRAPHY Left 07/13/2018   Procedure: UPPER EXTREMITY VENOGRAPHY - Central & Left Arm;  Surgeon: Conrad [place], MD;  Location: [place] [facility];  Service: Cardiovascular;  Laterality: Left;  . UPPER EXTREMITY VENOGRAPHY Bilateral 09/10/2019   Procedure: UPPER EXTREMITY VENOGRAPHY;  Surgeon: Waynetta Sandy, MD;  Location: [place] [facility];  Service: Cardiovascular;  Laterality: Bilateral;  . UTERINE FIBROID SURGERY    . VITRECTOMY Bilateral     Allergies  Allergen Reactions  . Ibuprofen Other (See Comments)    CKD stage 3. Should avoid.  . Dilaudid [Hydromorphone Hcl] Itching and Other (See Comments)    Can take with Benadryl.  . Tramadol Itching    Current Outpatient Medications    Medication Sig Dispense Refill  . amLODipine (NORVASC) 5 MG tablet Take 1 tablet (5 mg total) by mouth daily. (Patient taking differently: Take 10 mg by mouth daily. Reports MD increased to 10mg ) 90 tablet 3  . aspirin-acetaminophen-caffeine (EXCEDRIN MIGRAINE) 250-250-65 MG tablet Take 1 tablet by mouth daily as needed for headache.    . B Complex-C-Zn-Folic Acid (DIALYVITE 161-WRUE 15) 0.8 MG TABS Take 1 tablet by mouth daily.    . benzonatate (TESSALON) 200 MG capsule Take 1 capsule (200 mg total) by mouth 2 (two) times daily as needed for cough. 20 capsule 1  . Continuous Blood Gluc Sensor (FREESTYLE LIBRE 14 DAY SENSOR) MISC 1 patch by Does not apply route every 14 (fourteen) days. 2 each 6  . ELIQUIS 5 MG TABS tablet Take 5 mg by mouth 2 (two) times daily.     [name] gabapentin (NEURONTIN) 300 MG capsule Take 1 capsule (300 mg total) by mouth daily as needed (for pain). Take after dialysis 180 capsule 5  . insulin aspart (NOVOLOG FLEXPEN) 100 UNIT/ML FlexPen Inject 6 Units into the skin 3 (three) times daily with meals. Sliding scale    . insulin degludec (TRESIBA FLEXTOUCH) 100 UNIT/ML SOPN FlexTouch Pen Inject 0.26 mLs (26 Units total) into the skin at bedtime. 24 mL 3  . Insulin Pen Needle 29G X 5MM MISC 1 Device by Does not apply route 4 (four) times daily. 400 each 3  . lubiprostone (AMITIZA) 24 MCG capsule Take 1 capsule (24 mcg total) by mouth 2 (two) times daily with a meal. (Patient taking differently: Take 24 mcg by mouth daily. )    . midodrine (PROAMATINE) 5 MG tablet TAKE 1 TABLET (5 MG TOTAL) BY MOUTH 2 (TWO) TIMES DAILY WITH A MEAL. (Patient taking differently: Take 5 mg by mouth daily as needed (hypotension). ) 180 tablet 0  . montelukast (SINGULAIR) 10 MG tablet TAKE 1 TABLET BY MOUTH EVERYDAY AT BEDTIME 90 tablet 1   No current facility-administered medications for this visit.    Family History  Problem Relation Age of Onset  . Colon polyps Mother   . Diabetes Mother   .  Heart murmur Father        history essentially unknown  . Hypertension Father   . Diabetes Sister   . Kidney failure Sister        kidney transplant  . Diabetes Brother   . Retinal degeneration Brother   . Heart murmur Son   . Stroke  Paternal Grandmother   . Diabetes Sister     Social History   Socioeconomic History  . Marital status: Married    Spouse name: Not on file  . Number of children: 1  . Years of education: college  . Highest education level: Not on file  Occupational History  . Occupation: Armed forces technical officer: [employer]  Tobacco Use  . Smoking status: Never Smoker  . Smokeless tobacco: Never Used  Vaping Use  . Vaping Use: Never used  Substance and Sexual Activity  . Alcohol use: No    Alcohol/week: 0.0 standard drinks  . Drug use: No  . Sexual activity: Not Currently    Partners: Male    Birth control/protection: I.U.D.  Other Topics Concern  . Not on file  Social History Narrative   Patient lives with fiance. She has 1 grown son. Works full-time, she Paramedic for attorney.   Caffeine Use: 2 cups daily; sodas occasionally   She has 7 grandchildren   Social Determinants of Health   Financial Resource Strain:   . Difficulty of Paying Living Expenses: Not on file  Food Insecurity:   . Worried About Charity fundraiser in the Last Year: Not on file  . Ran Out of Food in the Last Year: Not on file  Transportation Needs:   . Lack of Transportation (Medical): Not on file  . Lack of Transportation (Non-Medical): Not on file  Physical Activity:   . Days of Exercise per Week: Not on file  . Minutes of Exercise per Session: Not on file  Stress:   . Feeling of Stress : Not on file  Social Connections:   . Frequency of Communication with Friends and Family: Not on file  . Frequency of Social Gatherings with Friends and Family: Not on file  . Attends Religious Services: Not on file  . Active Member of Clubs or Organizations: Not  on file  . Attends Archivist Meetings: Not on file  . Marital Status: Not on file  Intimate Partner Violence:   . Fear of Current or Ex-Partner: Not on file  . Emotionally Abused: Not on file  . Physically Abused: Not on file  . Sexually Abused: Not on file     ROS: [x]  Positive   [ ]  Negative   [ ]  All sytems reviewed and are negative *** Cardiac: []  chest pain/pressure []  SOB []  DOE  Vascular: []  pain in legs while walking []  pain in feet when lying flat []  hx of DVT []  swelling in legs  Pulmonary: []  asthma []  wheezing  Neurologic: []  weakness in []  arms []  legs []  numbness in []  arms []  legs [] difficulty speaking or slurred speech []  temporary loss of vision in one eye []  dizziness  Hematologic: []  bleeding problems  GI []  GERD  GU: [x]  CKD/renal failure  []  HD---[]  M/W/F []  T/T/S []  burning with urination []  blood in urine  Psychiatric: []  hx of major depression  Integumentary: []  rashes []  ulcers  Constitutional: []  fever []  chills  PHYSICAL EXAMINATION:  ***   General:  WDWN female in NAD Gait: Not observed HENT: WNL Pulmonary: normal non-labored breathing , without Rales, rhonchi,  wheezing Cardiac: {Desc; regular/irreg:14544}, without  Murmur {With/Without:20273} carotid bruit*** Abdomen: soft, NT, no masses Skin: {With/Without:20273} rashes, {With/Without:20273} ulcers  Vascular Exam/Pulses:   Right Left  Radial {Exam; arterial pulse strength 0-4:30167} {Exam; arterial pulse strength 0-4:30167}  Ulnar {Exam; arterial pulse strength 0-4:30167} {  Exam; arterial pulse strength 0-4:30167}  Brachial {Exam; arterial pulse strength 0-4:30167} {Exam; arterial pulse strength 0-4:30167}  Femoral {Exam; arterial pulse strength 0-4:30167} {Exam; arterial pulse strength 0-4:30167}  Popliteal {Exam; arterial pulse strength 0-4:30167} {Exam; arterial pulse strength 0-4:30167}  DP {Exam; arterial pulse strength 0-4:30167} {Exam; arterial  pulse strength 0-4:30167}  PT {Exam; arterial pulse strength 0-4:30167} {Exam; arterial pulse strength 0-4:30167}  Peroneal {Exam; arterial pulse strength 0-4:30167} {Exam; arterial pulse strength 0-4:30167}   Extremities:  {With/Without:20273} ischemic changes, {With/Without:20273} Gangrene, {With/Without:20273} cellulitis; {With/Without:20273} open wounds;  Musculoskeletal: no muscle wasting or atrophy  Neurologic: A&O X 3; Speech is fluent/normal  Non-Invasive Vascular Imaging:   Upper Extremity Vein Mapping on ***: Diameter:  *** Depth:  ***   ASSESSMENT/PLAN: 49 y.o. female with hx of right arm AVG who developed significant dysfunction with subsequent ligation of graft here for follow up.    -*** -pt *** on dialysis -pt is *** hand dominant - will plan for *** -pt *** on anticoagulation   Leontine Locket, [facility] [phone]  Clinic MD:   Donzetta Matters

## 2020-09-08 ENCOUNTER — Ambulatory Visit: Payer: Managed Care, Other (non HMO)

## 2020-09-10 ENCOUNTER — Telehealth: Payer: Self-pay

## 2020-09-10 ENCOUNTER — Ambulatory Visit: Payer: Managed Care, Other (non HMO)

## 2020-09-10 NOTE — Telephone Encounter (Signed)
PT called pt as she did not come to scheduled visit this evening. Wanted to make sure BP was doing ok as PT had instructed her to call and cancel if was high and she did last week. Pt got her days messed up thinking today was Tuesday. Reports she has been taking her BP meds and BP has been doing better. Will be at next visit on Monday. Cherly Anderson, PT, DPT, NCS

## 2020-09-15 ENCOUNTER — Ambulatory Visit: Payer: Managed Care, Other (non HMO)

## 2020-09-15 ENCOUNTER — Other Ambulatory Visit: Payer: Self-pay

## 2020-09-15 DIAGNOSIS — M6281 Muscle weakness (generalized): Secondary | ICD-10-CM

## 2020-09-15 DIAGNOSIS — R2689 Other abnormalities of gait and mobility: Secondary | ICD-10-CM | POA: Diagnosis not present

## 2020-09-15 DIAGNOSIS — R2681 Unsteadiness on feet: Secondary | ICD-10-CM

## 2020-09-15 DIAGNOSIS — R5381 Other malaise: Secondary | ICD-10-CM | POA: Diagnosis present

## 2020-09-15 NOTE — Therapy (Signed)
[facility] [address] [place], [place], [address] Phone: [phone]   Fax:  [phone]  Physical Therapy Treatment  Patient Details  Name: Heather [name] MRN: [id number] Date of Birth: 1971-02-01 Referring Provider (PT): Dimas Chyle   Encounter Date: 09/15/2020   PT End of Session - 09/15/20 1838    Visit Number 6    Number of Visits 17    Date for PT Re-Evaluation 76/73/41   90 day cert, 60 day poc   PT Start Time 1750    PT Stop Time 9379    PT Time Calculation (min) 40 min    Equipment Utilized During Treatment Gait belt    Activity Tolerance Patient tolerated treatment well    Behavior During Therapy [facility] for tasks assessed/performed           Past Medical History:  Diagnosis Date  . Antiplatelet or antithrombotic long-term use: Plavix 06/30/2017  . B12 deficiency 05/10/2014  . Blood transfusion without reported diagnosis   . Chronic constipation   . CVA (cerebral vascular accident) ([place]), nonhemorrhagic, inferior right cerebellum 12/03/2017   left side weakness in leg  . Cyst of left ovary 01/12/2018  . Diabetic neuropathy, painful ([place]), on low dose Gabapentin 07/13/2013  . DM (diabetes mellitus), type 2, uncontrolled, with renal complications ([place]) 02/40/9735  . DM2 (diabetes mellitus, type 2) ([place])   . Dysfunction of left eustachian tube, with pusatile tinnitus 11/24/2017  . Dyslipidemia associated with type 2 diabetes mellitus ([place]) 06/25/2016  . ESRD (end stage renal disease) on dialysis [facility])    On hemodialysis in July 2019 via [facility] then switched to [facility] in Nov 2019.   [name] ESRD with anemia ([place])   . Fibromyalgia   . Gastroparesis due to DM   . GERD (gastroesophageal reflux disease)   . Headache   . Hypertension associated with diabetes ([place]) 02/19/2011  . IBS (irritable bowel syndrome)   . Left-sided weakness 12/10/2017   .  [name] Leiomyoma of uterus   . Pancreatitis   . PE (pulmonary  thromboembolism) ([place]) 11/04/2019  . Pneumonia   . Proliferative diabetic retinopathy ([place]) 09/29/2015  . 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 Reactive depression 12/10/2017   .  [name] Right-sided low back pain without sciatica 12/08/2015  . Secondary hyperthyroidism 11/27/2013  . Steal syndrome dialysis vascular access ([place]) 02/17/2018  . Thromboembolism ([place]) 04/05/2018  . Upper airway cough syndrome, with recs to stay off ACE and take Pepcid q hs 11/15/2016    Past Surgical History:  Procedure Laterality Date  . A/V FISTULAGRAM Right 03/03/2020   Procedure: Venogram;  Surgeon: Waynetta Sandy, MD;  Location: [facility];  Service: Cardiovascular;  Laterality: Right;  . ARTERY REPAIR Right 02/17/2018   Procedure: EXPLORATION OF RIGHT BRACHIAL ARTERY;  Surgeon: Conrad Barber, MD;  Location: [facility];  Service: Vascular;  Laterality: Right;  . ARTERY REPAIR Right 02/17/2018   Procedure: BRACHIAL ARTERY EXPLORATION AND TRHOMBECTOMY;  Surgeon: Conrad Fallis, MD;  Location: [facility] OR;  Service: Vascular;  Laterality: Right;  . AV FISTULA PLACEMENT Left 05/09/2017   Procedure: INSERTION OF ARTERIOVENOUS (AV) GRAFT ARM (ARTEGRAFT);  Surgeon: Conrad [place], MD;  Location: [facility] OR;  Service: Vascular;  Laterality: Left;  . AV FISTULA PLACEMENT Right 02/17/2018   Procedure: INSERTION OF ARTERIOVENOUS (AV) GORE-TEX GRAFT ARM RIGHT UPPER ARM;  Surgeon: Conrad Bibb, MD;  Location: [facility];  Service: Vascular;  Laterality: Right;  .  AV FISTULA PLACEMENT Left 10/10/2019   Procedure: INSERTION OF LEFT ARTERIOVENOUS (AV) ARTEGRAFT GRAFT ARM;  Surgeon: Waynetta Sandy, MD;  Location: [facility];  Service: Vascular;  Laterality: Left;  . AV FISTULA PLACEMENT Right 03/19/2020   Procedure: INSERTION OF ARTERIOVENOUS (AV) GORE-TEX GRAFT IN RIGHT ARM AND VIABAHN STENT IN RIGHT AXILLARY ARTERY;  Surgeon: Waynetta Sandy, MD;  Location: [facility];  Service: Vascular;  Laterality: Right;  .  BASCILIC VEIN TRANSPOSITION Left 08/31/2016   Procedure: BASILIC VEIN TRANSPOSITION FIRST STAGE;  Surgeon: Conrad [place], MD;  Location: [facility];  Service: Vascular;  Laterality: Left;  . CENTRAL VENOUS CATHETER INSERTION Left 07/24/2018   Procedure: INSERTION CENTRAL LINE ADULT;  Surgeon: Conrad Taloga, MD;  Location: [facility];  Service: Vascular;  Laterality: Left;  . EYE SURGERY     secondary to diabetic retinopathy   . FOOT SURGERY Right    t"ook bone out- maybe hammer toe"  . INSERTION OF DIALYSIS CATHETER Left 07/24/2018   Procedure: INSERTION OF TUNNELED DIALYSIS CATHETER;  Surgeon: Conrad [place], MD;  Location: [facility];  Service: Vascular;  Laterality: Left;  . IR FLUORO GUIDE CV LINE RIGHT  07/21/2018  . IR FLUORO GUIDE CV LINE RIGHT  06/01/2019  . IR FLUORO GUIDE CV LINE RIGHT  05/05/2020  . IR US GUIDE VASC ACCESS RIGHT  07/21/2018  . IR US GUIDE VASC ACCESS RIGHT  06/01/2019  . LIGATION ARTERIOVENOUS GORTEX GRAFT Right 03/20/2020   Procedure: LIGATION ARM ARTERIOVENOUS GORTEX GRAFT With Patch Angioplasty, Thrombectomy;  Surgeon: Waynetta Sandy, MD;  Location: [facility];  Service: Vascular;  Laterality: Right;  . LIGATION OF ARTERIOVENOUS  FISTULA Left 05/09/2017   Procedure: LIGATION OF ARTERIOVENOUS  FISTULA;  Surgeon: Conrad [place], MD;  Location: [facility];  Service: Vascular;  Laterality: Left;  . LOOP RECORDER INSERTION N/A 12/05/2017   Procedure: LOOP RECORDER INSERTION;  Surgeon: Evans Lance, MD;  Location: [facility];  Service: Cardiovascular;  Laterality: N/A;  . MYOMECTOMY    . REMOVAL OF GRAFT Right 02/17/2018   Procedure: REMOVAL OF RIGHT UPPER ARM ARTERIOVENOUS GRAFT;  Surgeon: Conrad Conrad, MD;  Location: [facility];  Service: Vascular;  Laterality: Right;  . REVISION OF ARTERIOVENOUS GORETEX GRAFT Left 07/24/2018   Procedure: REDO ARTERIOVENOUS GORETEX GRAFT;  Surgeon: Conrad Ladora, MD;  Location: [facility];  Service: Vascular;  Laterality: Left;  . TEE WITHOUT CARDIOVERSION N/A  12/05/2017   Procedure: TRANSESOPHAGEAL ECHOCARDIOGRAM (TEE);  Surgeon: Sanda Klein, MD;  Location: [facility] ENDOSCOPY;  Service: Cardiovascular;  Laterality: N/A;  . UPPER EXTREMITY VENOGRAPHY Left 07/13/2018   Procedure: UPPER EXTREMITY VENOGRAPHY - Central & Left Arm;  Surgeon: Conrad , MD;  Location: [facility];  Service: Cardiovascular;  Laterality: Left;  . UPPER EXTREMITY VENOGRAPHY Bilateral 09/10/2019   Procedure: UPPER EXTREMITY VENOGRAPHY;  Surgeon: Waynetta Sandy, MD;  Location: [facility];  Service: Cardiovascular;  Laterality: Bilateral;  . UTERINE FIBROID SURGERY    . VITRECTOMY Bilateral     There were no vitals filed for this visit.   Subjective Assessment - 09/15/20 1758    Subjective I have been taking my BP medication and it has been better with home measurements of BP. I am tired.    Pertinent History CVA in 2018, type 2 diabetes, dyslipidemia, end-stage renal disease on hemodialysis, GERD, hypertension    Patient Stated Goals To get off walker so she can get on  transplant list.    Pain Onset More than a month ago           BP at start of the session ( in sitting) 170/96, 96bpm Manually stretched bil calves Supine knee extended: AA and manually resisted with slow reversal and quick stretch: ankle dorsiflexion, ankle plantarflexion, ankle eversion and inversion: 30x R and L Supine hooklying: AA and manually resisted with slow reversal and quick stretch: ankle dorsilfexion, ankle plantarflexion, ankle eversion and inversion: 30x R and L Supine hooklying: foot flat: toe extensions: 20x R and L Seated manually resisted with slow reversal and quick stretch: hip internal rotation and hip erectile dysfunction: 20x R and L  Gait training: 207' with rolling walker and SBA      BP immediately after walk: 147/82                     PT Short Term Goals - 07/14/20 0818      PT SHORT TERM GOAL #1   Title Pt will be  independent with initial HEP for strengtheining and balance to decrease fall risk.    Time 4    Period Weeks    Status New    Target Date 08/20/20   due to delay in scheduling     PT SHORT TERM GOAL #2   Title Pt will decrease 5 x sit to stand from 23. 67 sec to <20 sec from mat with hands for improved balance and functional strength.    Baseline 23.67 sec from low mat with hands at eval 07/14/20    Time 4    Period Weeks    Status New    Target Date 08/20/20      PT SHORT TERM GOAL #3   Title Pt will be further assessed for possible foot up brace versus AFO for left foot to improve mobility.    Time 4    Period Weeks    Status New    Target Date 08/20/20      PT SHORT TERM GOAL #4   Title Pt will amubulate >400' on varied surfaces with rollator mod I for short community distances.    Time 4    Period Weeks    Status New    Target Date 08/20/20             PT Long Term Goals - 07/14/20 6734      PT LONG TERM GOAL #1   Title Pt will be independent with progressive HEP for balance and strength to continue gains on own.    Time 8    Period Weeks    Status New    Target Date 09/17/20      PT LONG TERM GOAL #2   Title Pt will increase Berg Balance from 21/56 to >27/56 for improved balance and decreased fall risk.    Baseline 21/56 on 07/14/20    Time 8    Period Weeks    Status New    Target Date 09/17/20      PT LONG TERM GOAL #3   Title Pt will increase gait speed from 0.26m/s to >0.29m/s for improved gait safety in the community.    Baseline 0.46m/s on 07/14/20    Time 8    Period Weeks    Status New    Target Date 09/17/20      PT LONG TERM GOAL #4   Title Pt will decrease TUG from 18 sec to <16 se for improved  balance and functional mobility.    Baseline 18 sec on 07/14/20 with rollator    Time 8    Period Weeks    Status New    Target Date 09/17/20      PT LONG TERM GOAL #5   Title Pt will ambulate 250' with SPC versus no AD on level surfaces for  improved mobility in home.    Time 8    Period Weeks    Status New    Target Date 09/17/20                 Plan - 09/15/20 1841    Clinical Impression Statement Patient's BP was in top range of therapeutic range. We focused on bed exercises today. Patient has trace amount of activation in L ankle and foot intrinsics. Patient was given HEP to include foot and ankle AROM to improve muscle control and strength in bil distal extremities and proximal hip. Patient demonstrated good response with treament and walking as her BP improved by end of the session.    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 Has been extremely high. Did she hear from [facility]? PT was going to refax order and call. Continue strengthening and start balance as able.    Consulted and Agree with Plan of Care Patient           Patient will benefit from skilled therapeutic intervention in order to improve the following deficits and impairments:  Abnormal gait, Decreased activity tolerance, Decreased balance, Decreased endurance, Decreased knowledge of use of DME, Decreased mobility, Decreased range of motion, Difficulty walking, Decreased strength,  Impaired sensation  Visit Diagnosis: Other abnormalities of gait and mobility  Muscle weakness (generalized)  Debility  Unsteadiness on feet     Problem List Patient Active Problem List   Diagnosis Date Noted  . Positive ANA (antinuclear antibody) 04/16/2020  . ESRD on dialysis ([place])   . Hemiplegia and hemiparesis following cerebral infarction affecting left non-dominant side ([place]) 12/29/2018  . IBS (irritable bowel syndrome) 08/06/2017  . Dyslipidemia associated with type 2 diabetes mellitus ([place]) 06/25/2016  . Proliferative diabetic retinopathy ([place]) 09/29/2015  . B12 deficiency 05/10/2014  . Anemia in chronic kidney disease 04/26/2014  . CKD (chronic kidney disease) stage 5, GFR less than 15 ml/min ([facility]) 04/26/2014  . Secondary hyperparathyroidism of renal origin ([place]) 11/27/2013  . Posterior subcapsular cataract, bilateral 10/18/2013  . Diabetic neuropathy, painful ([place]), on low dose Gabapentin 07/13/2013  . Diabetes mellitus, type II, insulin dependent ([place]), on CGM, with end organ damage: renal, neuropathy, gastroparesis, retinopathy 02/19/2011  . Hypertension associated with diabetes ([place]) 02/19/2011  . Gastroparesis due to DM ([place]) 02/19/2011    Kerrie Pleasure 09/15/2020, [facility] [address] [place], [place], [address] Phone: [phone]   Fax:  [phone]  Name: Heather [name] MRN: [id number] Date of Birth: 1971-01-21

## 2020-09-15 NOTE — Patient Instructions (Signed)
Access Code: [email or web address] URL: [email or web address] Date: 09/15/2020 Prepared by: Markus Jarvis  Exercises Small Range Straight Leg Raise - 1 x daily - 7 x weekly - 2 sets - 10 reps Sidelying Diagonal Hip Abduction - 1 x daily - 7 x weekly - 2 sets - 10 reps Clamshell - 1 x daily - 7 x weekly - 2 sets - 10 reps Supine Bridge - 1 x daily - 7 x weekly - 2 sets - 10 reps Seated Toe Curl - 1 x daily - 7 x weekly - 2 sets - 10 reps Seated Great Toe Extension - 1 x daily - 7 x weekly - 2 sets - 10 reps Seated Lesser Toes Extension - 1 x daily - 7 x weekly - 2 sets - 10 reps Seated Hip Internal Rotation AROM - 1 x daily - 7 x weekly - 2 sets - 10 reps Seated Hip External Rotation AROM - 1 x daily - 7 x weekly - 2 sets - 10 reps

## 2020-09-16 ENCOUNTER — Telehealth: Payer: Self-pay

## 2020-09-16 NOTE — Telephone Encounter (Signed)
Dr. Jerline Pain, I faxed over a letter from [facility] with a request to addend your visit note with Suanne Marker on 9/3 to help support the need for the right AFO. Thanks so much for your help with this. Cherly Anderson, PT, DPT, NCS

## 2020-09-16 NOTE — Telephone Encounter (Signed)
Hi team have you seen the letter?  Heather [name]. Jerline Pain, MD 09/16/2020 10:20 AM

## 2020-09-17 ENCOUNTER — Ambulatory Visit: Payer: Managed Care, Other (non HMO)

## 2020-09-17 ENCOUNTER — Telehealth: Payer: Self-pay

## 2020-09-17 ENCOUNTER — Other Ambulatory Visit: Payer: Self-pay

## 2020-09-17 VITALS — BP 170/98

## 2020-09-17 DIAGNOSIS — R2689 Other abnormalities of gait and mobility: Secondary | ICD-10-CM

## 2020-09-17 NOTE — Telephone Encounter (Signed)
Dr. Jerline Pain, PT is placing pt on hold at this time due to BP continuing to be too elevated to safely participate. Pt's BP today was 170/98 with reports of 3 day headache. I have instructed her to follow up with you to see if any other med changes need to be made and to call to resume when BP is better managed. Thanks so much for you help. Cherly Anderson, PT, DPT, NCS

## 2020-09-17 NOTE — Therapy (Signed)
[facility] [address] [place], [place], [address] Phone: [phone]   Fax:  [phone]  Physical Therapy Treatment- arrived no charge  Patient Details  Name: KALAN RINN MRN: [id number] Date of Birth: [birth year] Referring Provider (PT): Dimas Chyle   Encounter Date: 09/17/2020   PT End of Session - 09/17/20 1713    Visit Number 6    Number of Visits 17    Date for PT Re-Evaluation 28/76/81   90 day cert, 60 day poc   PT Start Time 1711    PT Stop Time 1572   arrived no charge   PT Time Calculation (min) 14 min    Equipment Utilized During Treatment --    Activity Tolerance Patient tolerated treatment well    Behavior During Therapy [facility] for tasks assessed/performed           Past Medical History:  Diagnosis Date  . Antiplatelet or antithrombotic long-term use: Plavix 06/30/2017  . B12 deficiency 05/10/2014  . Blood transfusion without reported diagnosis   . Chronic constipation   . CVA (cerebral vascular accident) ([place]), nonhemorrhagic, inferior right cerebellum 12/03/2017   left side weakness in leg  . Cyst of left ovary 01/12/2018  . Diabetic neuropathy, painful ([place]), on low dose Gabapentin 07/13/2013  . DM (diabetes mellitus), type 2, uncontrolled, with renal complications ([place]) 62/02/5596  . DM2 (diabetes mellitus, type 2) ([place])   . Dysfunction of left eustachian tube, with pusatile tinnitus 11/24/2017  . Dyslipidemia associated with type 2 diabetes mellitus ([place]) 06/25/2016  . ESRD (end stage renal disease) on dialysis [facility])    On hemodialysis in July 2019 via [facility] then switched to [facility]CPD in Nov 2019.   [name] ESRD with anemia ([place])   . Fibromyalgia   . Gastroparesis due to DM   . GERD (gastroesophageal reflux disease)   . Headache   . Hypertension associated with diabetes ([place]) 02/19/2011  . IBS (irritable bowel syndrome)   . Left-sided weakness 12/10/2017   .  [name] Leiomyoma of uterus   .  Pancreatitis   . PE (pulmonary thromboembolism) ([place]) 11/04/2019  . Pneumonia   . Proliferative diabetic retinopathy ([place]) 09/29/2015  . 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 Reactive depression 12/10/2017   .  [name] Right-sided low back pain without sciatica 12/08/2015  . Secondary hyperthyroidism 11/27/2013  . Steal syndrome dialysis vascular access ([place]) 02/17/2018  . Thromboembolism ([place]) 04/05/2018  . Upper airway cough syndrome, with recs to stay off ACE and take Pepcid q hs 11/15/2016    Past Surgical History:  Procedure Laterality Date  . A/V FISTULAGRAM Right 03/03/2020   Procedure: Venogram;  Surgeon: Waynetta Sandy, MD;  Location: [name];  Service: Cardiovascular;  Laterality: Right;  . ARTERY REPAIR Right 02/17/2018   Procedure: EXPLORATION OF RIGHT BRACHIAL ARTERY;  Surgeon: Conrad [place], MD;  Location: [facility];  Service: Vascular;  Laterality: Right;  . ARTERY REPAIR Right 02/17/2018   Procedure: BRACHIAL ARTERY EXPLORATION AND TRHOMBECTOMY;  Surgeon: Conrad [place], MD;  Location: [facility] OR;  Service: Vascular;  Laterality: Right;  . AV FISTULA PLACEMENT Left 05/09/2017   Procedure: INSERTION OF ARTERIOVENOUS (AV) GRAFT ARM (ARTEGRAFT);  Surgeon: Conrad [place], MD;  Location: [facility] OR;  Service: Vascular;  Laterality: Left;  . AV FISTULA PLACEMENT Right 02/17/2018   Procedure: INSERTION OF ARTERIOVENOUS (AV) GORE-TEX GRAFT ARM RIGHT UPPER ARM;  Surgeon: Conrad [place]ton, MD;  Location: [facility];  Service: Vascular;  Laterality: Right;  . AV FISTULA PLACEMENT Left 10/10/2019   Procedure: INSERTION OF LEFT ARTERIOVENOUS (AV) ARTEGRAFT GRAFT ARM;  Surgeon: Waynetta Sandy, MD;  Location: [facility];  Service: Vascular;  Laterality: Left;  . AV FISTULA PLACEMENT Right 03/19/2020   Procedure: INSERTION OF ARTERIOVENOUS (AV) GORE-TEX GRAFT IN RIGHT ARM AND VIABAHN STENT IN RIGHT AXILLARY ARTERY;  Surgeon: Waynetta Sandy, MD;  Location: [facility];  Service:  Vascular;  Laterality: Right;  . BASCILIC VEIN TRANSPOSITION Left 08/31/2016   Procedure: BASILIC VEIN TRANSPOSITION FIRST STAGE;  Surgeon: Conrad [place], MD;  Location: [facility];  Service: Vascular;  Laterality: Left;  . CENTRAL VENOUS CATHETER INSERTION Left 07/24/2018   Procedure: INSERTION CENTRAL LINE ADULT;  Surgeon: Conrad [place], MD;  Location: [facility];  Service: Vascular;  Laterality: Left;  . EYE SURGERY     secondary to diabetic retinopathy   . FOOT SURGERY Right    t"ook bone out- maybe hammer toe"  . INSERTION OF DIALYSIS CATHETER Left 07/24/2018   Procedure: INSERTION OF TUNNELED DIALYSIS CATHETER;  Surgeon: Conrad [place], MD;  Location: [facility];  Service: Vascular;  Laterality: Left;  . IR FLUORO GUIDE CV LINE RIGHT  07/21/2018  . IR FLUORO GUIDE CV LINE RIGHT  06/01/2019  . IR FLUORO GUIDE CV LINE RIGHT  05/05/2020  . IR US GUIDE VASC ACCESS RIGHT  07/21/2018  . IR US GUIDE VASC ACCESS RIGHT  06/01/2019  . LIGATION ARTERIOVENOUS GORTEX GRAFT Right 03/20/2020   Procedure: LIGATION ARM ARTERIOVENOUS GORTEX GRAFT With Patch Angioplasty, Thrombectomy;  Surgeon: Waynetta Sandy, MD;  Location: [facility];  Service: Vascular;  Laterality: Right;  . LIGATION OF ARTERIOVENOUS  FISTULA Left 05/09/2017   Procedure: LIGATION OF ARTERIOVENOUS  FISTULA;  Surgeon: Conrad [place], MD;  Location: [facility];  Service: Vascular;  Laterality: Left;  . LOOP RECORDER INSERTION N/A 12/05/2017   Procedure: LOOP RECORDER INSERTION;  Surgeon: Evans Lance, MD;  Location: [facility];  Service: Cardiovascular;  Laterality: N/A;  . MYOMECTOMY    . REMOVAL OF GRAFT Right 02/17/2018   Procedure: REMOVAL OF RIGHT UPPER ARM ARTERIOVENOUS GRAFT;  Surgeon: Conrad [place], MD;  Location: [facility];  Service: Vascular;  Laterality: Right;  . REVISION OF ARTERIOVENOUS GORETEX GRAFT Left 07/24/2018   Procedure: REDO ARTERIOVENOUS GORETEX GRAFT;  Surgeon: Conrad [place], MD;  Location: [facility];  Service: Vascular;  Laterality: Left;   . TEE WITHOUT CARDIOVERSION N/A 12/05/2017   Procedure: TRANSESOPHAGEAL ECHOCARDIOGRAM (TEE);  Surgeon: Sanda Klein, MD;  Location: [facility] ENDOSCOPY;  Service: Cardiovascular;  Laterality: N/A;  . UPPER EXTREMITY VENOGRAPHY Left 07/13/2018   Procedure: UPPER EXTREMITY VENOGRAPHY - Central & Left Arm;  Surgeon: Conrad , MD;  Location: [facility];  Service: Cardiovascular;  Laterality: Left;  . UPPER EXTREMITY VENOGRAPHY Bilateral 09/10/2019   Procedure: UPPER EXTREMITY VENOGRAPHY;  Surgeon: Waynetta Sandy, MD;  Location: [facility];  Service: Cardiovascular;  Laterality: Bilateral;  . UTERINE FIBROID SURGERY    . VITRECTOMY Bilateral     Vitals:   09/17/20 1713  BP: (!) 170/98     Subjective Assessment - 09/17/20 1713    Subjective Pt came in without AD due to rain. Reports no other changes. Has not checked BP today. Has had a headache for past 3 days.    Pertinent History CVA in 2018, type 2 diabetes, dyslipidemia, end-stage renal disease on hemodialysis, GERD, hypertension  Patient Stated Goals To get off walker so she can get on transplant list.    Currently in Pain? Yes    Pain Score 8     Pain Location Head    Pain Descriptors / Indicators Headache    Effect of Pain on Daily Activities has had the last 3 days.                                     PT Education - 09/17/20 1740    Education Details Dicussed plan to place pt on hold until BP better controlled.    Person(s) Educated Patient    Methods Explanation    Comprehension Verbalized understanding            PT Short Term Goals - 07/14/20 0818      PT SHORT TERM GOAL #1   Title Pt will be independent with initial HEP for strengtheining and balance to decrease fall risk.    Time 4    Period Weeks    Status New    Target Date 08/20/20   due to delay in scheduling     PT SHORT TERM GOAL #2   Title Pt will decrease 5 x sit to stand from 23. 67 sec to <20 sec  from mat with hands for improved balance and functional strength.    Baseline 23.67 sec from low mat with hands at eval 07/14/20    Time 4    Period Weeks    Status New    Target Date 08/20/20      PT SHORT TERM GOAL #3   Title Pt will be further assessed for possible foot up brace versus AFO for left foot to improve mobility.    Time 4    Period Weeks    Status New    Target Date 08/20/20      PT SHORT TERM GOAL #4   Title Pt will amubulate >400' on varied surfaces with rollator mod I for short community distances.    Time 4    Period Weeks    Status New    Target Date 08/20/20             PT Long Term Goals - 07/14/20 7510      PT LONG TERM GOAL #1   Title Pt will be independent with progressive HEP for balance and strength to continue gains on own.    Time 8    Period Weeks    Status New    Target Date 09/17/20      PT LONG TERM GOAL #2   Title Pt will increase Berg Balance from 21/56 to >27/56 for improved balance and decreased fall risk.    Baseline 21/56 on 07/14/20    Time 8    Period Weeks    Status New    Target Date 09/17/20      PT LONG TERM GOAL #3   Title Pt will increase gait speed from 0.61m/s to >0.32m/s for improved gait safety in the community.    Baseline 0.24m/s on 07/14/20    Time 8    Period Weeks    Status New    Target Date 09/17/20      PT LONG TERM GOAL #4   Title Pt will decrease TUG from 18 sec to <16 se for improved balance and functional mobility.    Baseline 18 sec on 07/14/20 with  rollator    Time 8    Period Weeks    Status New    Target Date 09/17/20      PT LONG TERM GOAL #5   Title Pt will ambulate 250' with SPC versus no AD on level surfaces for improved mobility in home.    Time 8    Period Weeks    Status New    Target Date 09/17/20                 Plan - 09/17/20 1736    Clinical Impression Statement Pt arrived for session reporting headache and had elevated BP. Unsafe to participate at this time. PT  sending note to PCP and placing pt on hold until BP better controlled.    Personal Factors and Comorbidities Comorbidity 3+    Comorbidities CVA in 2018, type 2 diabetes, dyslipidemia, end-stage renal disease on hemodialysis, GERD, hypertension    Examination-Activity Limitations Transfers;Stand;Locomotion Level;Stairs    Examination-Participation Restrictions Community Activity;Cleaning    Rehab Potential Good    PT Frequency 2x / week    PT Duration 8 weeks    PT Treatment/Interventions ADLs/Self Care Home Management;DME Instruction;Gait training;Stair training;Functional mobility training;Neuromuscular re-education;Balance training;Therapeutic exercise;Therapeutic activities;Patient/family education;Orthotic Fit/Training;Manual techniques;Passive range of motion;Energy conservation    PT Next Visit Plan PT placing pt on hold until BP better controlled as has been in unsafe range to participate. Pt sending note to PCP, Dr. Jerline Pain to see if more adjustments need to be made. Pt to call back to schedule after MD sees her and BP has been maintained <150s/80s consistently for at least a week to resume.    Consulted and Agree with Plan of Care Patient           Patient will benefit from skilled therapeutic intervention in order to improve the following deficits and impairments:  Abnormal gait, Decreased activity tolerance, Decreased balance, Decreased endurance, Decreased knowledge of use of DME, Decreased mobility, Decreased range of motion, Difficulty walking, Decreased strength, Impaired sensation  Visit Diagnosis: Other abnormalities of gait and mobility     Problem List Patient Active Problem List   Diagnosis Date Noted  . Positive ANA (antinuclear antibody) 04/16/2020  . ESRD on dialysis ([place])   . Hemiplegia and hemiparesis following cerebral infarction affecting left non-dominant side ([place]) 12/29/2018  . IBS (irritable bowel syndrome) 08/06/2017  . Dyslipidemia associated with type  2 diabetes mellitus ([place]) 06/25/2016  . Proliferative diabetic retinopathy ([place]) 09/29/2015  . B12 deficiency 05/10/2014  . Anemia in chronic kidney disease 04/26/2014  . CKD (chronic kidney disease) stage 5, GFR less than 15 ml/min (H[facility]) 04/26/2014  . Secondary hyperparathyroidism of renal origin ([place]) 11/27/2013  . Posterior subcapsular cataract, bilateral 10/18/2013  . Diabetic neuropathy, painful ([place]), on low dose Gabapentin 07/13/2013  . Diabetes mellitus, type II, insulin dependent ([place]), on CGM, with end organ damage: renal, neuropathy, gastroparesis, retinopathy 02/19/2011  . Hypertension associated with diabetes ([place]) 02/19/2011  . Gastroparesis due to DM ([place]) 02/19/2011    Electa Sniff, PT, DPT, NCS 09/17/2020, 5:40 PM  [facility] [address] [place], [place], [address] Phone: [phone]   Fax:  [phone]  Name: ANALILIA GEDDIS MRN: [id number] Date of Birth: March 29, 1971

## 2020-09-22 DIAGNOSIS — T782XXA Anaphylactic shock, unspecified, initial encounter: Secondary | ICD-10-CM | POA: Insufficient documentation

## 2020-09-22 DIAGNOSIS — T7840XA Allergy, unspecified, initial encounter: Secondary | ICD-10-CM | POA: Insufficient documentation

## 2020-09-26 ENCOUNTER — Encounter: Payer: Self-pay | Admitting: Emergency Medicine

## 2020-09-26 ENCOUNTER — Other Ambulatory Visit: Payer: Self-pay

## 2020-09-26 ENCOUNTER — Ambulatory Visit (INDEPENDENT_AMBULATORY_CARE_PROVIDER_SITE_OTHER): Payer: Managed Care, Other (non HMO) | Admitting: Emergency Medicine

## 2020-09-26 DIAGNOSIS — R053 Chronic cough: Secondary | ICD-10-CM | POA: Diagnosis not present

## 2020-09-26 DIAGNOSIS — R9389 Abnormal findings on diagnostic imaging of other specified body structures: Secondary | ICD-10-CM | POA: Insufficient documentation

## 2020-09-26 MED ORDER — MONTELUKAST SODIUM 10 MG PO TABS: ORAL_TABLET | ORAL | 1 refills | Status: DC

## 2020-09-26 MED ORDER — HYDROCODONE-HOMATROPINE 5-1.5 MG/5ML PO SYRP: 5.0000 mL | ORAL_SOLUTION | Freq: Four times a day (QID) | ORAL | 0 refills | Status: DC | PRN

## 2020-09-26 MED ORDER — CETIRIZINE HCL 10 MG PO TABS
10.0000 mg | ORAL_TABLET | Freq: Every day | ORAL | 1 refills | Status: DC
Start: 2020-09-26 — End: 2021-09-17

## 2020-09-26 MED ORDER — PANTOPRAZOLE SODIUM 40 MG PO TBEC
40.0000 mg | DELAYED_RELEASE_TABLET | Freq: Every day | ORAL | 1 refills | Status: DC
Start: 2020-09-26 — End: 2021-03-03

## 2020-09-26 NOTE — Progress Notes (Signed)
Subjective:    Patient ID: Heather [name], female    DOB: 1971-10-14, 49 y.o.   MRN: [id number]  HPI 49 year old never smoker with a history of diabetes, hypertension, end-stage renal disease on formerly on peritoneal dialysis now HD, VTE/PE (late 2020) managed with Eliquis now off, GERD, cerebrovascular disease with prior CVA.  She has been seen here remotely for upper airway cough.  In May 2021 she had an ICU admission following a cardiopulmonary arrest when she was undergoing elective dialysis catheter placement in IR.  [name] referred today for evaluation of abnormal CT scan of the chest. She has been experiencing chronic persistent cough since May hospitalization, non productive.  [name] having some nasal drainage, hasn't noticed any GERD This prompted her CT done on 07/31/2020 which I have reviewed, shows no significant adenopathy, some nonspecific scattered clustered centrilobular groundglass nodular opacities in the bilateral upper lobes.   Review of Systems As per HPI  Past Medical History:  Diagnosis Date  . Antiplatelet or antithrombotic long-term use: Plavix 06/30/2017  . B12 deficiency 05/10/2014  . Blood transfusion without reported diagnosis   . Chronic constipation   . CVA (cerebral vascular accident) ([place]), nonhemorrhagic, inferior right cerebellum 12/03/2017   left side weakness in leg  . Cyst of left ovary 01/12/2018  . Diabetic neuropathy, painful ([place]), on low dose Gabapentin 07/13/2013  . DM (diabetes mellitus), type 2, uncontrolled, with renal complications ([place]) 22/97/9892  . DM2 (diabetes mellitus, type 2) ([place])   . Dysfunction of left eustachian tube, with pusatile tinnitus 11/24/2017  . Dyslipidemia associated with type 2 diabetes mellitus ([place]) 06/25/2016  . ESRD (end stage renal disease) on dialysis [facility])    On hemodialysis in July 2019 via [facility] then switched to CCPD in Nov 2019.   [name] ESRD with anemia ([place])   . Fibromyalgia   . Gastroparesis due to DM   .  GERD (gastroesophageal reflux disease)   . Headache   . Hypertension associated with diabetes ([place]) 02/19/2011  . IBS (irritable bowel syndrome)   . Left-sided weakness 12/10/2017   .  [name] Leiomyoma of uterus   . Pancreatitis   . PE (pulmonary thromboembolism) ([place]) 11/04/2019  . Pneumonia   . Proliferative diabetic retinopathy ([place]) 09/29/2015  . 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 Reactive depression 12/10/2017   .  [name] Right-sided low back pain without sciatica 12/08/2015  . Secondary hyperthyroidism 11/27/2013  . Steal syndrome dialysis vascular access ([place]) 02/17/2018  . Thromboembolism ([place]) 04/05/2018  . Upper airway cough syndrome, with recs to stay off ACE and take Pepcid q hs 11/15/2016     Family History  Problem Relation Age of Onset  . Colon polyps Mother   . Diabetes Mother   . Heart murmur Father        history essentially unknown  . Hypertension Father   . Diabetes Sister   . Kidney failure Sister        kidney transplant  . Diabetes Brother   . Retinal degeneration Brother   . Heart murmur Son   . Stroke Paternal Grandmother   . Diabetes Sister      Social History   Socioeconomic History  . Marital status: Married    Spouse name: Not on file  . Number of children: 1  . Years of education: college  . Highest education level: Not on file  Occupational History  . Occupation: Armed forces technical officer: [employer]  Tobacco Use  . Smoking status: Never Smoker  . Smokeless tobacco: Never Used  Vaping Use  . Vaping Use: Never used  Substance and Sexual Activity  . Alcohol use: No    Alcohol/week: 0.0 standard drinks  . Drug use: No  . Sexual activity: Not Currently    Partners: Male    Birth control/protection: I.U.D.  Other Topics Concern  . Not on file  Social History Narrative   Patient lives with fiance. She has 1 grown son. Works full-time, she Paramedic for attorney.   Caffeine Use: 2 cups daily; sodas occasionally     She has 7 grandchildren   Social Determinants of Health   Financial Resource Strain:   . Difficulty of Paying Living Expenses: Not on file  Food Insecurity:   . Worried About Charity fundraiser in the Last Year: Not on file  . Ran Out of Food in the Last Year: Not on file  Transportation Needs:   . Lack of Transportation (Medical): Not on file  . Lack of Transportation (Non-Medical): Not on file  Physical Activity:   . Days of Exercise per Week: Not on file  . Minutes of Exercise per Session: Not on file  Stress:   . Feeling of Stress : Not on file  Social Connections:   . Frequency of Communication with Friends and Family: Not on file  . Frequency of Social Gatherings with Friends and Family: Not on file  . Attends Religious Services: Not on file  . Active Member of Clubs or Organizations: Not on file  . Attends Archivist Meetings: Not on file  . Marital Status: Not on file  Intimate Partner Violence:   . Fear of Current or Ex-Partner: Not on file  . Emotionally Abused: Not on file  . Physically Abused: Not on file  . Sexually Abused: Not on file     Allergies  Allergen Reactions  . Ibuprofen Other (See Comments)    CKD stage 3. Should avoid.  . Dilaudid [Hydromorphone Hcl] Itching and Other (See Comments)    Can take with Benadryl.  . Tramadol Itching     Outpatient Medications Prior to Visit  Medication Sig Dispense Refill  . amLODipine (NORVASC) 5 MG tablet Take 1 tablet (5 mg total) by mouth daily. (Patient taking differently: Take 10 mg by mouth daily. Reports MD increased to 48m) 90 tablet 3  . aspirin-acetaminophen-caffeine (EXCEDRIN MIGRAINE) 250-250-65 MG tablet Take 1 tablet by mouth daily as needed for headache.    . B Complex-C-Zn-Folic Acid (DIALYVITE 8496-PRFF15) 0.8 MG TABS Take 1 tablet by mouth daily.    . Continuous Blood Gluc Sensor (FREESTYLE LIBRE 14 DAY SENSOR) MISC 1 patch by Does not apply route every 14 (fourteen) days. 2 each 6   . gabapentin (NEURONTIN) 300 MG capsule Take 1 capsule (300 mg total) by mouth daily as needed (for pain). Take after dialysis 180 capsule 5  . insulin aspart (NOVOLOG FLEXPEN) 100 UNIT/ML FlexPen Inject 6 Units into the skin 3 (three) times daily with meals. Sliding scale    . insulin degludec (TRESIBA FLEXTOUCH) 100 UNIT/ML SOPN FlexTouch Pen Inject 0.26 mLs (26 Units total) into the skin at bedtime. 24 mL 3  . Insulin Pen Needle 29G X 5MM MISC 1 Device by Does not apply route 4 (four) times daily. 400 each 3  . lubiprostone (AMITIZA) 24 MCG capsule Take 1 capsule (24 mcg total) by mouth 2 (two) times daily with a meal. (  Patient taking differently: Take 24 mcg by mouth daily. )    . midodrine (PROAMATINE) 5 MG tablet TAKE 1 TABLET (5 MG TOTAL) BY MOUTH 2 (TWO) TIMES DAILY WITH A MEAL. (Patient taking differently: Take 5 mg by mouth daily as needed (hypotension). ) 180 tablet 0  . ELIQUIS 5 MG TABS tablet Take 5 mg by mouth 2 (two) times daily.     . benzonatate (TESSALON) 200 MG capsule Take 1 capsule (200 mg total) by mouth 2 (two) times daily as needed for cough. (Patient not taking: Reported on 09/26/2020) 20 capsule 1  . montelukast (SINGULAIR) 10 MG tablet TAKE 1 TABLET BY MOUTH EVERYDAY AT BEDTIME (Patient not taking: Reported on 09/26/2020) 90 tablet 1   No facility-administered medications prior to visit.        Objective:   Physical Exam Vitals:   09/26/20 1007  BP: (!) 142/78  Pulse: 86  Temp: (!) 97.2 F (36.2 C)  TempSrc: Temporal  SpO2: 98%  Weight: 222 lb 3.2 oz (100.8 kg)  Height: _0  (1.753 m)   Gen: Pleasant, overwt woman, in no distress,  normal affect  ENT: No lesions,  mouth clear,  oropharynx clear, no postnasal drip  Neck: No JVD, no stridor  Lungs: No use of accessory muscles, no crackles or wheezing on normal respiration, no wheeze on forced expiration  Cardiovascular: RRR, heart sounds normal, no murmur or gallops, no peripheral  edema  Musculoskeletal: No deformities, no cyanosis or clubbing  Neuro: alert, awake, non focal  Skin: Warm, no lesions or rash      Assessment & Plan:  Abnormal CT of the chest Mild bilateral upper lobe micronodular disease, subtle groundglass, right greater than left.  Looks inflammatory, could represent opportunistic mycobacterial infection.  Less likely pulmonary edema, but she does have known volume shifts with her hemodialysis.  I think we should repeat her CT at the 51-monthmark.  If the infiltrates persist then we can decide whether bronchoscopy is warranted depending on their severity and the severity of her cough.  She may need bronchoscopy and BAL for culture.  Chronic cough May be upper airway in nature, started when she required intubation urgently during her May admission.  Sustained factors likely include allergic rhinitis, possibly GERD although if so this is silent.  Must also consider a connection between her nodular infiltrates and her cough.  We will repeat her CT as above.  In the meantime restart an allergy regimen, add an empiric GERD regimen to see if she gets benefit.  Hycodan for cough suppression.  She did not benefit from [employer]  Please take Zyrtec 10 mg once daily until next visit We will restart your Singulair 10 mg each evening Try starting pantoprazole 40 mg once daily until next visit.  Take this medication 1 hour around food You can use Hycodan 5 cc up to every 6 hours if needed to suppress your cough.  Please remember that this medication can make you sleepy.  Do not operate a car or machinery. Follow with Dr BLamonte Sakaiin 1 month after your CT scan to review the results together   RBaltazar Apo MD, PhD 09/26/2020, 10:40 AM [facility] [phone] if no answer [phone]

## 2020-09-26 NOTE — Assessment & Plan Note (Signed)
Mild bilateral upper lobe micronodular disease, subtle groundglass, right greater than left.  Looks inflammatory, could represent opportunistic mycobacterial infection.  Less likely pulmonary edema, but she does have known volume shifts with her hemodialysis.  I think we should repeat her CT at the 75-monthmark.  If the infiltrates persist then we can decide whether bronchoscopy is warranted depending on their severity and the severity of her cough.  She may need bronchoscopy and BAL for culture.

## 2020-09-26 NOTE — Assessment & Plan Note (Signed)
May be upper airway in nature, started when she required intubation urgently during her May admission.  Sustained factors likely include allergic rhinitis, possibly GERD although if so this is silent.  Must also consider a connection between her nodular infiltrates and her cough.  We will repeat her CT as above.  In the meantime restart an allergy regimen, add an empiric GERD regimen to see if she gets benefit.  Hycodan for cough suppression.  She did not benefit from [employer].  Please take Zyrtec 10 mg once daily until next visit We will restart your Singulair 10 mg each evening Try starting pantoprazole 40 mg once daily until next visit.  Take this medication 1 hour around food You can use Hycodan 5 cc up to every 6 hours if needed to suppress your cough.  Please remember that this medication can make you sleepy.  Do not operate a car or machinery. Follow with Dr Lamonte Sakai in 1 month after your CT scan to review the results together

## 2020-09-26 NOTE — Patient Instructions (Signed)
We will plan to repeat your CT scan of the chest in early November to follow your lung inflammation Please take Zyrtec 10 mg once daily until next visit We will restart your Singulair 10 mg each evening Try starting pantoprazole 40 mg once daily until next visit.  Take this medication 1 hour around food You can use Hycodan 5 cc up to every 6 hours if needed to suppress your cough.  Please remember that this medication can make you sleepy.  Do not operate a car or machinery. Follow with Dr Lamonte Sakai in 1 month after your CT scan to review the results together

## 2020-09-26 NOTE — Addendum Note (Signed)
Addended by: Gavin Potters R on: 09/26/2020 11:03 AM   Modules accepted: Orders

## 2020-09-29 NOTE — Telephone Encounter (Signed)
Heather [name], has this been handled?

## 2020-09-30 NOTE — Telephone Encounter (Signed)
We have not received this fax. If is possible, can you refax it?

## 2020-10-03 NOTE — Telephone Encounter (Signed)
I just refaxed. The fax I sent to was [phone].

## 2020-10-31 ENCOUNTER — Other Ambulatory Visit: Payer: Self-pay | Admitting: Emergency Medicine

## 2020-11-05 ENCOUNTER — Ambulatory Visit (INDEPENDENT_AMBULATORY_CARE_PROVIDER_SITE_OTHER)
Admission: RE | Admit: 2020-11-05 | Discharge: 2020-11-05 | Disposition: A | Discharge status: Home / Self Care | Payer: Managed Care, Other (non HMO) | Source: Ambulatory Visit | Attending: Emergency Medicine | Admitting: Emergency Medicine

## 2020-11-05 ENCOUNTER — Other Ambulatory Visit: Payer: Self-pay

## 2020-11-05 DIAGNOSIS — R9389 Abnormal findings on diagnostic imaging of other specified body structures: Secondary | ICD-10-CM

## 2020-11-06 ENCOUNTER — Ambulatory Visit (INDEPENDENT_AMBULATORY_CARE_PROVIDER_SITE_OTHER): Payer: Managed Care, Other (non HMO) | Admitting: Pulmonary Disease

## 2020-11-06 ENCOUNTER — Encounter: Payer: Self-pay | Admitting: Pulmonary Disease

## 2020-11-06 ENCOUNTER — Ambulatory Visit: Payer: Managed Care, Other (non HMO) | Admitting: Pulmonary Disease

## 2020-11-06 ENCOUNTER — Other Ambulatory Visit: Payer: Self-pay

## 2020-11-06 VITALS — BP 130/70 | HR 84 | Temp 97.6°F

## 2020-11-06 DIAGNOSIS — Z992 Dependence on renal dialysis: Secondary | ICD-10-CM

## 2020-11-06 DIAGNOSIS — R9389 Abnormal findings on diagnostic imaging of other specified body structures: Secondary | ICD-10-CM | POA: Diagnosis not present

## 2020-11-06 DIAGNOSIS — R053 Chronic cough: Secondary | ICD-10-CM | POA: Diagnosis not present

## 2020-11-06 DIAGNOSIS — N186 End stage renal disease: Secondary | ICD-10-CM

## 2020-11-06 NOTE — Patient Instructions (Addendum)
You were seen today by Lauraine Rinne, NP  for:   1. Abnormal CT of the chest  As discussed today we will review your CT with Dr. Lamonte Sakai.  Are also discussed with Dr. Ferol Luz that you would like to proceed forward with a bronchoscopy to further evaluate the imaging findings given the persistence of your cough  Our office should be in contact with you to get you scheduled for this after I discussed with Dr. Lamonte Sakai  2. Chronic cough  Continue daily Zyrtec  Continue daily Protonix  Continue daily Singulair      Keep scheduled follow-up with primary care  Keep scheduled follow-up with Tuesday Thursday Saturday hemodialysis   Follow Up:    Return in about 2 months (around 01/06/2021), or if symptoms worsen or fail to improve, for Follow up with Dr. Lamonte Sakai.   Notification of test results are managed in the following manner: If there are  any recommendations or changes to the  plan of care discussed in office today,  we will contact you and let you know what they are. If you do not hear from [name], then your results are normal and you can view them through your  MyChart account , or a letter will be sent to you. Thank you again for trusting [name] with your care  - Thank you, [facility]    It is flu season:   >>> Best ways to protect herself from the flu: Receive the yearly flu vaccine, practice good hand hygiene washing with soap and also using hand sanitizer when available, eat a nutritious meals, get adequate rest, hydrate appropriately       Please contact the office if your symptoms worsen or you have concerns that you are not improving.   Thank you for choosing [facility] for your healthcare, and for allowing [name] to partner with you on your healthcare journey. I am thankful to be able to provide care to you today.   Wyn Quaker FNP-C

## 2020-11-06 NOTE — Assessment & Plan Note (Signed)
Plan: Continue Tuesday, Thursday, Saturday dialysis

## 2020-11-06 NOTE — Progress Notes (Signed)
_0  ID: Heather [name], female    DOB: 1971-09-21, 49 y.o.   MRN: [id number]  Chief Complaint  Patient presents with  . Follow-up    Upper airway cough syndrome, CT results    Referring provider: [name], MD  HPI:  49 year old female never smoker followed in our office for chronic cough and abnormal CT chest  PMH: Chronic kidney disease, hemodialysis, type 2 diabetes, hypertension, diabetic neuropathy, IBS, history of positive ANA Smoker/ Smoking History: Never smoker Maintenance: None Pt of: Dr. Lamonte Sakai  11/06/2020  - Visit   49 year old female never smoker followed in our office for chronic cough.  [name] established with Dr. Lamonte Sakai.  She was last seen in October/2021 as a consult.  Plan of care from that office visit was as follows: Repeat CT of chest, if infiltrates persist at 72-monthmark may need to consider bronchoscopy for BAL and culture, start daily antihistamine, restart Singulair, start Protonix, can use Hycodan cough syrup to suppress her cough, follow-up in 1 month after CT.  11/05/2020-CT chest without contrast-numerous tiny clustered centrilobular pulmonary nodules and groundglass opacities most concentrated in the upper lobes are unchanged compared to prior exam, findings remain consistent with nonspecific infection or inflammation, unchanged moderate pericardial effusion, coronary artery disease, aortic arthrosclerosis  Cough back  Dry cough  Tuesday / Thursday / Sat  Keeps appt  Pulled fluid off    11/06/2020 - Cough ROS:   When to the symptoms start: May/2021 How are you today: not as bad  Have you had fever/sore throat (first 5 to 7 days of URI) or Have you had cough/nasal congestion (10 to 14 days of URI) : nasal drainage - clear  Have you used anything to treat the cough, as anything improved : nothing Is it a dry or wet cough: dry Does the cough happen when your breathing or when you breathe out: have no idea Other any triggers to  your cough, or any aggravating factors: cough worse in am, through out the day as well  Daily antihistamine: Zyrtec GERD treatment: Protonix  Singulair: Yes  Cough checklist (bolded indicates presence):  Adherence, acid reflux, ACE inhibitor, active sinus disease, active smoking, adverse effects of medications (amiodarone/Macrodantin/bb), alpha 1, allergies, aspiration, anxiety, ?bronchiectasis, ? Atypical Infection,  congestive heart failure (diastolic)     Questionaires / Pulmonary Flowsheets:   ACT:  No flowsheet data found.  MMRC: No flowsheet data found.  Epworth:  No flowsheet data found.  Tests:   11/05/2020-CT chest without contrast-numerous tiny clustered centrilobular pulmonary nodules and groundglass opacities most concentrated in the upper lobes are unchanged compared to prior exam, findings remain consistent with nonspecific infection or inflammation, unchanged moderate pericardial effusion, coronary artery disease, aortic arthrosclerosis  05/06/2020-echocardiogram-LV ejection fraction 65 to 701% grade 1 diastolic dysfunction  FENO:  No results found for: NITRICOXIDE  PFT: No flowsheet data found.  WALK:  No flowsheet data found.  Imaging: CT Chest Wo Contrast  Result Date: 11/05/2020 CLINICAL DATA:  Follow-up lung nodules EXAM: CT CHEST WITHOUT CONTRAST TECHNIQUE: Multidetector CT imaging of the chest was performed following the standard protocol without IV contrast. COMPARISON:  07/30/2020 FINDINGS: Cardiovascular: Right neck large bore multi lumen vascular catheter, tip position near the superior cavoatrial junction. Aortic atherosclerosis. Normal heart size. Three-vessel coronary artery calcifications. Unchanged moderate pericardial effusion. Mediastinum/Nodes: No enlarged mediastinal, hilar, or axillary lymph nodes. Thyroid gland, trachea, and esophagus demonstrate no significant findings. Lungs/Pleura: Numerous tiny clustered centrilobular pulmonary nodules  and ground-glass  opacities most concentrated in the upper lobes are unchanged compared to prior examination. No pleural effusion or pneumothorax. Upper Abdomen: No acute abnormality. Musculoskeletal: No chest wall mass or suspicious bone lesions identified. IMPRESSION: 1. Numerous tiny clustered centrilobular pulmonary nodules and ground-glass opacities most concentrated in the upper lobes are unchanged compared to prior examination. Findings remain consistent with nonspecific infection or inflammation. 2. Unchanged moderate pericardial effusion. 3. Coronary artery disease.  Aortic Atherosclerosis (ICD10-I70.0). Electronically Signed   By: Eddie Candle M.D.   On: 11/05/2020 14:15    Lab Results:  CBC    Component Value Date/Time   WBC 7.3 06/04/2020 1200   RBC 3.06 (L) 06/04/2020 1200   HGB 9.6 (L) 06/04/2020 1200   HCT 31.3 (L) 06/04/2020 1200   PLT 238.0 06/04/2020 1200   MCV 102.2 (H) 06/04/2020 1200   MCH 30.3 05/06/2020 1856   MCHC 30.8 06/04/2020 1200   RDW 17.6 (H) 06/04/2020 1200   LYMPHSABS 4.3 (H) 05/05/2020 1722   MONOABS 0.2 05/05/2020 1722   EOSABS 0.0 05/05/2020 1722   BASOSABS 0.0 05/05/2020 1722    BMET    Component Value Date/Time   NA 137 05/07/2020 0819   NA 133 (A) 03/20/2019 0000   K 5.3 (H) 05/07/2020 0819   K 4.3 10/16/2013 0000   CL 108 05/07/2020 0819   CL 100 10/16/2013 0000   CO2 20 (L) 05/07/2020 0819   GLUCOSE 153 (H) 05/07/2020 0819   BUN 56 (H) 05/07/2020 0819   BUN 41 (A) 03/20/2019 0000   CREATININE 10.98 (H) 05/07/2020 0819   CREATININE 3.05 (H) 09/03/2015 0855   CALCIUM 8.3 (L) 05/07/2020 0819   CALCIUM 8.9 10/16/2013 0000   GFRNONAA 4 (L) 05/07/2020 0819   GFRNONAA 18 (L) 09/03/2015 0855   GFRAA 4 (L) 05/07/2020 0819   GFRAA 20 (L) 09/03/2015 0855    BNP No results found for: BNP  ProBNP    Component Value Date/Time   PROBNP 65.0 04/11/2018 1421    Specialty Problems      Pulmonary Problems   Chronic cough       Allergies  Allergen Reactions  . Ibuprofen Other (See Comments)    CKD stage 3. Should avoid.  . Dilaudid [Hydromorphone Hcl] Itching and Other (See Comments)    Can take with Benadryl.  . Tramadol Itching    Immunization History  Administered Date(s) Administered  . Influenza Inj Mdck Quad Pf 09/03/2016  . Influenza, Seasonal, Injecte, Preservative Fre 09/28/2018  . Influenza,inj,Quad PF,6+ Mos 09/20/2013, 09/11/2014, 09/26/2016, 10/18/2019, 10/18/2019, 09/23/2020  . Influenza-Unspecified 03/27/2013, 09/03/2016  . Moderna SARS-COVID-2 Vaccination 08/24/2020, 09/21/2020  . Pneumococcal Conjugate-13 12/24/2016  . Pneumococcal Polysaccharide-23 04/26/2014, 09/11/2014, 03/15/2019  . Tdap 09/20/2013    Past Medical History:  Diagnosis Date  . Antiplatelet or antithrombotic long-term use: Plavix 06/30/2017  . B12 deficiency 05/10/2014  . Blood transfusion without reported diagnosis   . Chronic constipation   . CVA (cerebral vascular accident) ([place]), nonhemorrhagic, inferior right cerebellum 12/03/2017   left side weakness in leg  . Cyst of left ovary 01/12/2018  . Diabetic neuropathy, painful ([place]), on low dose Gabapentin 07/13/2013  . DM (diabetes mellitus), type 2, uncontrolled, with renal complications ([place]) 23/76/2831  . DM2 (diabetes mellitus, type 2) ([place])   . Dysfunction of left eustachian tube, with pusatile tinnitus 11/24/2017  . Dyslipidemia associated with type 2 diabetes mellitus ([place]) 06/25/2016  . ESRD (end stage renal disease) on dialysis [facility])    On hemodialysis in  July 2019 via [facility] then switched to [facility] in Nov 2019.   [name] ESRD with anemia ([place])   . Fibromyalgia   . Gastroparesis due to DM   . GERD (gastroesophageal reflux disease)   . Headache   . Hypertension associated with diabetes ([place]) 02/19/2011  . IBS (irritable bowel syndrome)   . Left-sided weakness 12/10/2017   .  [name] Leiomyoma of uterus   . Pancreatitis   . PE (pulmonary thromboembolism) ([place])  11/04/2019  . Pneumonia   . Proliferative diabetic retinopathy ([place]) 09/29/2015  . 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 Reactive depression 12/10/2017   .  [name] Right-sided low back pain without sciatica 12/08/2015  . Secondary hyperthyroidism 11/27/2013  . Steal syndrome dialysis vascular access ([place]) 02/17/2018  . Thromboembolism ([place]) 04/05/2018  . Upper airway cough syndrome, with recs to stay off ACE and take Pepcid q hs 11/15/2016    Tobacco History: Social History   Tobacco Use  Smoking Status Never Smoker  Smokeless Tobacco Never Used   Counseling given: Not Answered   Continue to not smoke  Outpatient Encounter Medications as of 11/06/2020  Medication Sig  . amLODipine (NORVASC) 5 MG tablet Take 1 tablet (5 mg total) by mouth daily. (Patient taking differently: Take 10 mg by mouth daily. Reports MD increased to 66m)  . aspirin-acetaminophen-caffeine (EXCEDRIN MIGRAINE) 250-250-65 MG tablet Take 1 tablet by mouth daily as needed for headache.  . B Complex-C-Zn-Folic Acid (DIALYVITE 8010-[id number]) 0.8 MG TABS Take 1 tablet by mouth daily.  . benzonatate (TESSALON) 200 MG capsule Take 1 capsule (200 mg total) by mouth 2 (two) times daily as needed for cough.  . cetirizine (ZYRTEC ALLERGY) 10 MG tablet Take 1 tablet (10 mg total) by mouth daily.  . cloNIDine (CATAPRES) 0.1 MG tablet Take by mouth.  . Continuous Blood Gluc Sensor (FREESTYLE LIBRE 14 DAY SENSOR) MISC 1 patch by Does not apply route every 14 (fourteen) days.  .Marland Kitchendoxercalciferol (HECTOROL) 0.5 MCG capsule Doxercalciferol (Hectorol)  . gabapentin (NEURONTIN) 300 MG capsule Take 1 capsule (300 mg total) by mouth daily as needed (for pain). Take after dialysis  . HYDROcodone-homatropine (HYCODAN) 5-1.5 MG/5ML syrup Take 5 mLs by mouth every 6 (six) hours as needed for cough.  . insulin aspart (NOVOLOG FLEXPEN) 100 UNIT/ML FlexPen Inject 6 Units into the skin 3 (three) times daily with meals. Sliding scale  .  insulin degludec (TRESIBA FLEXTOUCH) 100 UNIT/ML SOPN FlexTouch Pen Inject 0.26 mLs (26 Units total) into the skin at bedtime.  . Insulin Pen Needle 29G X 5MM MISC 1 Device by Does not apply route 4 (four) times daily.  .Marland Kitchenlubiprostone (AMITIZA) 24 MCG capsule Take 1 capsule (24 mcg total) by mouth 2 (two) times daily with a meal. (Patient taking differently: Take 24 mcg by mouth daily. )  . midodrine (PROAMATINE) 5 MG tablet TAKE 1 TABLET (5 MG TOTAL) BY MOUTH 2 (TWO) TIMES DAILY WITH A MEAL. (Patient taking differently: Take 5 mg by mouth daily as needed (hypotension). )  . montelukast (SINGULAIR) 10 MG tablet TAKE 1 TABLET BY MOUTH EVERYDAY AT BEDTIME  . pantoprazole (PROTONIX) 40 MG tablet Take 1 tablet (40 mg total) by mouth daily.  . traMADol (ULTRAM) 50 MG tablet Take by mouth.   No facility-administered encounter medications on file as of 11/06/2020.     Review of Systems  Review of Systems  Constitutional: Positive for fatigue. Negative for activity change and fever.  HENT: Negative for sinus  pressure, sinus pain and sore throat.   Respiratory: Positive for cough and shortness of breath. Negative for wheezing.   Cardiovascular: Negative for chest pain and palpitations.  Gastrointestinal: Negative for diarrhea, nausea and vomiting.  Musculoskeletal: Negative for arthralgias.       Right broken foot  Neurological: Negative for dizziness.  Psychiatric/Behavioral: Negative for sleep disturbance. The patient is not nervous/anxious.      Physical Exam  BP 130/70 (BP Location: Left Arm, Cuff Size: Normal)   Pulse 84   Temp 97.6 F (36.4 C) (Oral)   SpO2 96%   Wt Readings from Last 5 Encounters:  09/26/20 222 lb 3.2 oz (100.8 kg)  07/16/20 226 lb (102.5 kg)  06/06/20 223 lb (101.2 kg)  06/04/20 227 lb 4 oz (103.1 kg)  05/07/20 233 lb 7.5 oz (105.9 kg)    BMI Readings from Last 5 Encounters:  09/26/20 32.81 kg/m  07/16/20 34.36 kg/m  06/06/20 33.91 kg/m  06/04/20 34.55  kg/m  05/07/20 35.50 kg/m     Physical Exam Vitals and nursing note reviewed.  Constitutional:      General: [name] not in acute distress.    Appearance: Normal appearance. [name] obese.  HENT:     Head: Normocephalic and atraumatic.     Right Ear: External ear normal.     Left Ear: External ear normal.     Nose: Nose normal. No congestion.     Mouth/Throat:     Mouth: Mucous membranes are moist.     Pharynx: Oropharynx is clear.  Eyes:     Pupils: Pupils are equal, round, and reactive to light.  Cardiovascular:     Rate and Rhythm: Normal rate and regular rhythm.     Pulses: Normal pulses.     Heart sounds: Normal heart sounds. No murmur heard.   Pulmonary:     Breath sounds: No decreased air movement. No decreased breath sounds, wheezing or rales.  Musculoskeletal:        General: Signs of injury (Right broken foot) present.     Cervical back: Normal range of motion.  Skin:    General: Skin is warm and dry.     Capillary Refill: Capillary refill takes less than 2 seconds.  Neurological:     General: No focal deficit present.     Mental Status: [name] alert and oriented to person, place, and time. Mental status is at baseline.     Gait: Gait abnormal (In wheelchair).  Psychiatric:        Mood and Affect: Mood normal.        Behavior: Behavior normal.        Thought Content: Thought content normal.        Judgment: Judgment normal.       Assessment & Plan:   Chronic cough Chronic cough is likely multifactorial given abnormal CT imaging favoring atypical infection.  Known acid reflux, known gastroparesis, known rhinitis.  Plan: We will discuss your most recent CT as well as your preference to proceed forward with a bronchoscopy with Dr. Lamonte Sakai Continue Protonix Continue daily Zyrtec Continue Singulair Follow-up in 2 months   Abnormal CT of the chest Persistent opacities on repeat CT chest Patient unable to produce adequate sputum samples She reports  cough is mainly dry  Discussion: Discussed with patient conservative management such as following with the CT imaging, attempting to obtain oral sputum cultures or proceeding forward with a diagnostic bronchoscopy with BAL and cultures.  Patient would  like to proceed forward with a bronchoscopy with BAL and cultures if Dr. Lamonte Sakai agrees.  We will discuss this with Dr. Lamonte Sakai.    Plan: We will discuss case with Dr. Lamonte Sakai  ESRD on dialysis [facility]) Plan: Continue Tuesday, Thursday, Saturday dialysis    Return in about 2 months (around 01/06/2021), or if symptoms worsen or fail to improve, for Follow up with Dr. Lamonte Sakai.   Lauraine Rinne, NP 11/06/2020   This appointment required 34 minutes of patient care (this includes precharting, chart review, review of results, face-to-face care, etc.).

## 2020-11-06 NOTE — Assessment & Plan Note (Signed)
Chronic cough is likely multifactorial given abnormal CT imaging favoring atypical infection.  Known acid reflux, known gastroparesis, known rhinitis.  Plan: We will discuss your most recent CT as well as your preference to proceed forward with a bronchoscopy with Dr. Lamonte Sakai Continue Protonix Continue daily Zyrtec Continue Singulair Follow-up in 2 months

## 2020-11-06 NOTE — Assessment & Plan Note (Signed)
Persistent opacities on repeat CT chest Patient unable to produce adequate sputum samples She reports cough is mainly dry  Discussion: Discussed with patient conservative management such as following with the CT imaging, attempting to obtain oral sputum cultures or proceeding forward with a diagnostic bronchoscopy with BAL and cultures.  Patient would like to proceed forward with a bronchoscopy with BAL and cultures if Dr. Lamonte Sakai agrees.  We will discuss this with Dr. Lamonte Sakai.    Plan: We will discuss case with Dr. Lamonte Sakai

## 2020-11-12 ENCOUNTER — Other Ambulatory Visit: Payer: Self-pay | Admitting: Family Medicine

## 2020-11-12 DIAGNOSIS — E1140 Type 2 diabetes mellitus with diabetic neuropathy, unspecified: Secondary | ICD-10-CM

## 2020-11-12 DIAGNOSIS — Z8674 Personal history of sudden cardiac arrest: Secondary | ICD-10-CM | POA: Insufficient documentation

## 2021-01-16 ENCOUNTER — Other Ambulatory Visit (HOSPITAL_COMMUNITY): Payer: Self-pay | Admitting: Nephrology

## 2021-01-16 DIAGNOSIS — N186 End stage renal disease: Secondary | ICD-10-CM

## 2021-01-19 ENCOUNTER — Ambulatory Visit (INDEPENDENT_AMBULATORY_CARE_PROVIDER_SITE_OTHER): Payer: Managed Care, Other (non HMO) | Admitting: Family Medicine

## 2021-01-19 ENCOUNTER — Other Ambulatory Visit: Payer: Self-pay

## 2021-01-19 ENCOUNTER — Other Ambulatory Visit: Payer: Self-pay | Admitting: *Deleted

## 2021-01-19 ENCOUNTER — Encounter: Payer: Self-pay | Admitting: Family Medicine

## 2021-01-19 VITALS — BP 209/106 | HR 89 | Ht 69.0 in

## 2021-01-19 DIAGNOSIS — N186 End stage renal disease: Secondary | ICD-10-CM | POA: Diagnosis not present

## 2021-01-19 DIAGNOSIS — E119 Type 2 diabetes mellitus without complications: Secondary | ICD-10-CM

## 2021-01-19 DIAGNOSIS — E1159 Type 2 diabetes mellitus with other circulatory complications: Secondary | ICD-10-CM

## 2021-01-19 DIAGNOSIS — N2581 Secondary hyperparathyroidism of renal origin: Secondary | ICD-10-CM

## 2021-01-19 DIAGNOSIS — Z992 Dependence on renal dialysis: Secondary | ICD-10-CM

## 2021-01-19 DIAGNOSIS — Z794 Long term (current) use of insulin: Secondary | ICD-10-CM | POA: Diagnosis not present

## 2021-01-19 DIAGNOSIS — I152 Hypertension secondary to endocrine disorders: Secondary | ICD-10-CM

## 2021-01-19 LAB — POCT GLYCOSYLATED HEMOGLOBIN (HGB A1C): Hemoglobin A1C: 7.1 % — AB (ref 4.0–5.6)

## 2021-01-19 MED ORDER — FREESTYLE LIBRE 14 DAY SENSOR MISC: 1.0000 | 5 refills | Status: DC

## 2021-01-19 MED ORDER — LISINOPRIL 10 MG PO TABS
10.0000 mg | ORAL_TABLET | ORAL | 3 refills | Status: DC
Start: 2021-01-19 — End: 2021-01-19

## 2021-01-19 MED ORDER — LISINOPRIL 10 MG PO TABS: 10.0000 mg | ORAL_TABLET | ORAL | 3 refills | Status: DC

## 2021-01-19 NOTE — Progress Notes (Signed)
   Heather [name] is a 50 y.o. female who presents today for an office visit.  Assessment/Plan:  Chronic Problems Addressed Today: Secondary hyperparathyroidism of renal origin Heather [name]) Patient with recent nonhealing calcaneal fracture.  Advised her to have vitamin D level checked soon with nephrology.  ESRD on dialysis Heather [name]) Continue dialysis Tuesday, Thursday, and Saturday.    Hypertension associated with diabetes (Heather [name]) Elevated today though no symptoms.  Has been very labile in the past.  Usually comes down at  dialysis.  Will restartLisinopril 10 mg 3 times weekly after dialysis.  Continue amlodipine 10 mg daily.  Diabetes mellitus, type II, insulin dependent (Heather [name]), on CGM, with end organ damage: renal, neuropathy, gastroparesis, retinopathy   A1c stable at 7.1.  Continue Tresiba 26 units daily and NovoLog 6 units 3 times daily.  Recheck A1c in 6 months.     Subjective:  HPI:  See A/p.         Objective:  Physical Exam: BP (!) 209/106   Pulse 89   Ht 5\' 9"  (1.753 m)   SpO2 99%   BMI 32.81 kg/m   Gen: No acute distress, resting comfortably CV: Regular rate and rhythm with no murmurs appreciated Pulm: Normal work of breathing, clear to auscultation bilaterally with no crackles, wheezes, or rhonchi Neuro: Grossly normal, moves all extremities Psych: Normal affect and thought content      Heather [name] M. Jerline Pain, MD 01/19/2021 10:29 AM

## 2021-01-19 NOTE — Assessment & Plan Note (Signed)
A1c stable at 7.1.  Continue Tresiba 26 units daily and NovoLog 6 units 3 times daily.  Recheck A1c in 6 months.

## 2021-01-19 NOTE — Assessment & Plan Note (Signed)
Patient with recent nonhealing calcaneal fracture.  Advised her to have vitamin D level checked soon with nephrology.

## 2021-01-19 NOTE — Patient Instructions (Signed)
It was very nice to see you today!  Please check with your kidney doctor about having your vitamin D level checked.  I am concerned this may be the reason that your bones are not healing.  Please start lisinopril 10 mg 3 times weekly after dialysis.  Your A1c looks good today.  No other changes.  I will see back in 3 to 6 months.  Please come back to see me sooner if needed.  Take care, Dr Jerline Pain  Please try these tips to maintain a healthy lifestyle:   Eat at least 3 REAL meals and 1-2 snacks per day.  Aim for no more than 5 hours between eating.  If you eat breakfast, please do so within one hour of getting up.    Each meal should contain half fruits/vegetables, one quarter protein, and one quarter carbs (no bigger than a computer mouse)   Cut down on sweet beverages. This includes juice, soda, and sweet tea.     Drink at least 1 glass of water with each meal and aim for at least 8 glasses per day   Exercise at least 150 minutes every week.

## 2021-01-19 NOTE — Assessment & Plan Note (Signed)
Elevated today though no symptoms.  Has been very labile in the past.  Usually comes down at  dialysis.  Will restartLisinopril 10 mg 3 times weekly after dialysis.  Continue amlodipine 10 mg daily.

## 2021-01-19 NOTE — Assessment & Plan Note (Signed)
Continue dialysis Tuesday, Thursday, and Saturday 

## 2021-01-21 ENCOUNTER — Ambulatory Visit: Payer: Managed Care, Other (non HMO) | Admitting: Emergency Medicine

## 2021-01-23 ENCOUNTER — Other Ambulatory Visit: Payer: Self-pay

## 2021-01-23 ENCOUNTER — Ambulatory Visit (HOSPITAL_COMMUNITY)
Admission: RE | Admit: 2021-01-23 | Discharge: 2021-01-23 | Disposition: A | Discharge status: Home / Self Care | Payer: Managed Care, Other (non HMO) | Source: Ambulatory Visit | Attending: Nephrology | Admitting: Nephrology

## 2021-01-23 DIAGNOSIS — Z4901 Encounter for fitting and adjustment of extracorporeal dialysis catheter: Secondary | ICD-10-CM | POA: Diagnosis not present

## 2021-01-23 DIAGNOSIS — N186 End stage renal disease: Secondary | ICD-10-CM | POA: Diagnosis not present

## 2021-01-23 HISTORY — PX: IR REMOVAL TUN CV CATH W/O FL: IMG2289

## 2021-01-23 MED ORDER — LIDOCAINE HCL 1 % IJ SOLN
INTRAMUSCULAR | Status: AC
  Filled 2021-01-23: qty 20

## 2021-01-23 MED ORDER — LIDOCAINE HCL 1 % IJ SOLN
INTRAMUSCULAR | Status: DC | PRN
  Administered 2021-01-23: 10 mL

## 2021-01-23 NOTE — Procedures (Signed)
Right tunneled IJ HD cath removed in its entirety without immediate complications. Medication used- 1% lidocaine to skin/SQ tissue. Gauze dressing applied to site. EBL < 3 cc.

## 2021-01-26 ENCOUNTER — Ambulatory Visit: Payer: Managed Care, Other (non HMO) | Admitting: Emergency Medicine

## 2021-02-23 ENCOUNTER — Telehealth: Payer: Self-pay

## 2021-02-23 DIAGNOSIS — M14671 Charcot's joint, right ankle and foot: Secondary | ICD-10-CM | POA: Insufficient documentation

## 2021-02-23 NOTE — Telephone Encounter (Signed)
Apolonio Schneiders is calling in from [place], she states she is a Marine scientist from care management and is wondering if someone can give her a call about the patient.

## 2021-02-23 NOTE — Telephone Encounter (Signed)
Unable to reach rachael, [facility]

## 2021-03-03 ENCOUNTER — Other Ambulatory Visit: Payer: Self-pay

## 2021-03-03 ENCOUNTER — Ambulatory Visit (INDEPENDENT_AMBULATORY_CARE_PROVIDER_SITE_OTHER): Payer: Managed Care, Other (non HMO) | Admitting: Family Medicine

## 2021-03-03 ENCOUNTER — Encounter: Payer: Self-pay | Admitting: Family Medicine

## 2021-03-03 VITALS — BP 171/90 | HR 87 | Temp 98.5°F | Ht 69.0 in

## 2021-03-03 DIAGNOSIS — E538 Deficiency of other specified B group vitamins: Secondary | ICD-10-CM | POA: Diagnosis not present

## 2021-03-03 DIAGNOSIS — E1140 Type 2 diabetes mellitus with diabetic neuropathy, unspecified: Secondary | ICD-10-CM | POA: Diagnosis not present

## 2021-03-03 MED ORDER — CLINDAMYCIN PHOSPHATE 1 % EX GEL: Freq: Two times a day (BID) | CUTANEOUS | 0 refills | Status: DC

## 2021-03-03 MED ORDER — PREGABALIN 75 MG PO CAPS
75.0000 mg | ORAL_CAPSULE | ORAL | 5 refills | Status: DC
Start: 2021-03-04 — End: 2021-03-31

## 2021-03-03 MED ORDER — DOXYCYCLINE HYCLATE 100 MG PO TABS: 100.0000 mg | ORAL_TABLET | Freq: Two times a day (BID) | ORAL | 0 refills | Status: DC

## 2021-03-03 NOTE — Assessment & Plan Note (Signed)
Gabapentin seem to be effective.  We will switch to Lyrica 75 mg 3 times weekly after dialysis.  She has been with the Lyrica in the past.

## 2021-03-03 NOTE — Patient Instructions (Signed)
It was very nice to see you today!  Please start the doxycycline and Clindagel.  We will switch from gabapentin to Lyrica.  Please let me know how this does with your neuropathy.  Please try taking 1000 to 2000 mcg of B12 daily.  Hopefully this should help some with the neuropathy as well.  I will see back in a few months.  Discussed your blood sugar.  Please come back to see me sooner if needed.  Take care, Dr Jerline Pain  Please try these tips to maintain a healthy lifestyle:   Eat at least 3 REAL meals and 1-2 snacks per day.  Aim for no more than 5 hours between eating.  If you eat breakfast, please do so within one hour of getting up.    Each meal should contain half fruits/vegetables, one quarter protein, and one quarter carbs (no bigger than a computer mouse)   Cut down on sweet beverages. This includes juice, soda, and sweet tea.     Drink at least 1 glass of water with each meal and aim for at least 8 glasses per day   Exercise at least 150 minutes every week.

## 2021-03-03 NOTE — Progress Notes (Signed)
   Heather [name] is a 50 y.o. female who presents today for an office visit.  Assessment/Plan:  New/Acute Problems: Rash Consistent with folliculitis.  Given widespread and pustular nature we will start course of doxycycline.  Also start topical Clindagel.  She will let me know if not improving.  Chronic Problems Addressed Today: B12 deficiency Recommend starting supplementation 1000 2000 mcg daily.  Diabetic neuropathy, painful (Heather [name]), on low dose Gabapentin Gabapentin seem to be effective.  We will switch to Lyrica 75 mg 3 times weekly after dialysis.  She has been with the Lyrica in the past.     Subjective:  HPI:  Patient here with rash on her face.  Started a month ago.  Has had several pustules.  Tried several over-the-counter acne medications with no improvement.  No obvious precipitating events.  Symptoms seem to be getting worse.  [name] also had worsening neuropathy.  She has been on Lyrica in the past and has done well with this.  [name] currently on gabapentin 300 mg after dialysis.       Objective:  Physical Exam: BP (!) 171/90   Pulse 87   Temp 98.5 F (36.9 C) (Temporal)   Ht 5\' 9"  (1.753 m)   SpO2 100%   BMI 32.81 kg/m   Gen: No acute distress, resting comfortably  Skin: Several pustules noted scattered across face predominantly on right lower chin. Neuro: Grossly normal, moves all extremities Psych: Normal affect and thought content      Caleb M. Jerline Pain, MD 03/03/2021 3:15 PM

## 2021-03-03 NOTE — Assessment & Plan Note (Signed)
Recommend starting supplementation 1000 2000 mcg daily.

## 2021-03-04 DIAGNOSIS — I639 Cerebral infarction, unspecified: Secondary | ICD-10-CM | POA: Insufficient documentation

## 2021-03-04 DIAGNOSIS — I2699 Other pulmonary embolism without acute cor pulmonale: Secondary | ICD-10-CM | POA: Insufficient documentation

## 2021-03-25 DIAGNOSIS — Z7409 Other reduced mobility: Secondary | ICD-10-CM | POA: Insufficient documentation

## 2021-03-25 DIAGNOSIS — Z789 Other specified health status: Secondary | ICD-10-CM | POA: Insufficient documentation

## 2021-03-31 ENCOUNTER — Encounter: Payer: Self-pay | Admitting: Family Medicine

## 2021-03-31 ENCOUNTER — Telehealth (INDEPENDENT_AMBULATORY_CARE_PROVIDER_SITE_OTHER): Payer: Medicare Other | Admitting: Family Medicine

## 2021-03-31 ENCOUNTER — Other Ambulatory Visit: Payer: Self-pay

## 2021-03-31 ENCOUNTER — Other Ambulatory Visit: Payer: Self-pay | Admitting: Family Medicine

## 2021-03-31 ENCOUNTER — Inpatient Hospital Stay: Payer: Managed Care, Other (non HMO) | Admitting: Family Medicine

## 2021-03-31 DIAGNOSIS — E1143 Type 2 diabetes mellitus with diabetic autonomic (poly)neuropathy: Secondary | ICD-10-CM | POA: Diagnosis not present

## 2021-03-31 DIAGNOSIS — R053 Chronic cough: Secondary | ICD-10-CM

## 2021-03-31 DIAGNOSIS — Z89511 Acquired absence of right leg below knee: Secondary | ICD-10-CM | POA: Diagnosis not present

## 2021-03-31 DIAGNOSIS — E1140 Type 2 diabetes mellitus with diabetic neuropathy, unspecified: Secondary | ICD-10-CM | POA: Diagnosis not present

## 2021-03-31 DIAGNOSIS — K3184 Gastroparesis: Secondary | ICD-10-CM

## 2021-03-31 MED ORDER — PREGABALIN 75 MG PO CAPS: 75.0000 mg | ORAL_CAPSULE | Freq: Every day | ORAL | 5 refills | Status: DC

## 2021-03-31 MED ORDER — GUAIFENESIN-DM 100-10 MG/5ML PO SYRP: 5.0000 mL | ORAL_SOLUTION | ORAL | 0 refills | Status: DC | PRN

## 2021-03-31 MED ORDER — PROMETHAZINE HCL 25 MG PO TABS: 25.0000 mg | ORAL_TABLET | Freq: Three times a day (TID) | ORAL | 0 refills | Status: DC | PRN

## 2021-03-31 NOTE — Assessment & Plan Note (Signed)
[name] on Lyrica 3 times weekly.  Will increase to 75 mg once daily.  She will check in with me later this week and let me know how this is working for her.  Advised her to take after her dialysis on her dialysis days.

## 2021-03-31 NOTE — Assessment & Plan Note (Signed)
Doing well postoperative.  Will be following up with orthopedic soon.

## 2021-03-31 NOTE — Assessment & Plan Note (Signed)
Has seen pulmonology in the past for this.  Has had a recent flareup.  We will be sending in Phenergan for her gastroparesis which should be helping.

## 2021-03-31 NOTE — Assessment & Plan Note (Signed)
Recent flare.  Will refill Phenergan today.  Discussed reasons to return to care.

## 2021-03-31 NOTE — Progress Notes (Signed)
   Heather [name] is a 50 y.o. female who presents today for a virtual office visit.  Assessment/Plan:  Chronic Problems Addressed Today: Chronic cough Has seen pulmonology in the past for this.  Has had a recent flareup.  We will be sending in Phenergan for her gastroparesis which should be helping.  Diabetic neuropathy, painful ([place]) [name] on Lyrica 3 times weekly.  Will increase to 75 mg once daily.  She will check in with me later this week and let me know how this is working for her.  Advised her to take after her dialysis on her dialysis days.  S/P BKA (below knee amputation), right ([place]) Doing well postoperative.  Will be following up with orthopedic soon.  Gastroparesis due to DM [facility]) Recent flare.  Will refill Phenergan today.  Discussed reasons to return to care.     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  She has been following with podiatry for this.  They had opted for an elective below-knee amputation.  She underwent right BKA on 03/06/2021.  She was discharged to inpatient rehab on 03/12/2021.  Some nausea and vomiting the last day with a bit of a cough.  [name] recovering well from her surgery.  Work with physical therapy.  Will be following up with orthopedics soon.  No fevers or chills.  No signs of infection.       Objective/Observations  Physical Exam: Gen: NAD, resting comfortably Pulm: Normal work of breathing Neuro: Grossly normal, moves all extremities Psych: Normal affect and thought content  Virtual Visit via Video   I connected with Heather [name] on 03/31/21 at  3:40 PM EDT by a video enabled telemedicine application and verified that I am speaking with the correct person using two identifiers. The limitations of evaluation and management by telemedicine and the availability of in person appointments were discussed. The patient expressed understanding and agreed  to proceed.   Patient location: Home Provider location: [place] Office Persons participating in the virtual visit: Myself and Patient  Time Spent: 45 minutes of total time was spent on the date of the encounter performing the following actions: chart review prior to seeing the patient including her recent hospitalization, obtaining history, performing a medically necessary exam, counseling on the treatment plan, placing orders, and documenting in our EHR.       [name]. Jerline Pain, MD 03/31/2021 4:12 PM

## 2021-03-31 NOTE — Progress Notes (Incomplete)
   Heather [name] is a 50 y.o. female who presents today for an office visit.  Assessment/Plan:  New/Acute Problems: ***  Chronic Problems Addressed Today: No problem-specific Assessment & Plan notes found for this encounter.     Subjective:  HPI:  ***        Objective:  Physical Exam: There were no vitals taken for this visit.  Gen: No acute distress, resting comfortably*** CV: Regular rate and rhythm with no murmurs appreciated Pulm: Normal work of breathing, clear to auscultation bilaterally with no crackles, wheezes, or rhonchi Neuro: Grossly normal, moves all extremities Psych: Normal affect and thought content      Caleb M. Jerline Pain, MD 03/31/2021 10:50 AM

## 2021-04-23 ENCOUNTER — Ambulatory Visit: Payer: Managed Care, Other (non HMO) | Attending: Internal Medicine

## 2021-04-23 ENCOUNTER — Other Ambulatory Visit: Payer: Self-pay

## 2021-04-23 DIAGNOSIS — Z20822 Contact with and (suspected) exposure to covid-19: Secondary | ICD-10-CM

## 2021-04-24 LAB — SARS-COV-2, NAA 2 DAY TAT

## 2021-04-24 LAB — NOVEL CORONAVIRUS, NAA: SARS-CoV-2, NAA: NOT DETECTED

## 2021-04-27 ENCOUNTER — Other Ambulatory Visit: Payer: Self-pay | Admitting: Family Medicine

## 2021-06-03 ENCOUNTER — Encounter (HOSPITAL_COMMUNITY): Payer: Self-pay | Admitting: *Deleted

## 2021-06-11 ENCOUNTER — Other Ambulatory Visit: Payer: Self-pay | Admitting: Family Medicine

## 2021-06-11 ENCOUNTER — Telehealth: Payer: Self-pay

## 2021-06-11 NOTE — Telephone Encounter (Signed)
Please see message and advise 

## 2021-06-11 NOTE — Telephone Encounter (Signed)
Patient is calling in stating that she was prescribed pregabalin (LYRICA) 75 MG capsule.  Wondering if she can get back on Gabapentin, as the pregabalin isnt helping her pain.

## 2021-06-11 NOTE — Telephone Encounter (Signed)
Ok to switch back to gabapentin 300mg  three times weekly after dialysis.  [name]. Jerline Pain, MD 06/11/2021 4:30 PM

## 2021-06-12 ENCOUNTER — Other Ambulatory Visit: Payer: Self-pay

## 2021-06-12 ENCOUNTER — Ambulatory Visit (INDEPENDENT_AMBULATORY_CARE_PROVIDER_SITE_OTHER): Payer: Managed Care, Other (non HMO) | Admitting: Family Medicine

## 2021-06-12 ENCOUNTER — Encounter: Payer: Self-pay | Admitting: Family Medicine

## 2021-06-12 VITALS — BP 130/88 | HR 84 | Temp 97.8°F | Resp 17 | Ht 69.0 in | Wt 216.0 lb

## 2021-06-12 DIAGNOSIS — N644 Mastodynia: Secondary | ICD-10-CM

## 2021-06-12 DIAGNOSIS — R0781 Pleurodynia: Secondary | ICD-10-CM

## 2021-06-12 MED ORDER — METHOCARBAMOL 500 MG PO TABS: 500.0000 mg | ORAL_TABLET | Freq: Three times a day (TID) | ORAL | 0 refills | Status: DC | PRN

## 2021-06-12 MED ORDER — TRAMADOL HCL 50 MG PO TABS: 50.0000 mg | ORAL_TABLET | Freq: Three times a day (TID) | ORAL | 0 refills | Status: AC | PRN

## 2021-06-12 NOTE — Patient Instructions (Signed)
Follow up as needed or as scheduled We'll call you with your diagnostic mammogram I suspect this is a muscle strain from coughing and should improve w/ time Continue the tylenol regularly ADD the Tramadol as needed for severe pain (if you develop itching, stop!) USE the Methocarbamol (muscle relaxer) to help w/ pain- particularly when lying down at night Alternate ice/heat Try and wear the most supportive bra you have Call with any questions or concerns Hang in there!

## 2021-06-12 NOTE — Progress Notes (Signed)
   Subjective:    Patient ID: Heather [name], female    DOB: [birth year], 50 y.o.   MRN: [id number]  HPI L breast pain- last mammo done June 2017.  Sxs started ~1 month ago while she was sick w/ COVID.  Pt reports excessive coughing.  Pt has TTP over L lower breast.  If breast is not compressed, no pain.  But pain w/ movement or pushing on the area.  No noted redness/bruising/swelling.  No drainage from nipple, no skin changes.  L breast is larger.  Pt reports fair amount of caffeine intake.  Also has pain of L lower ribs.  No known injury   Review of Systems For ROS see HPI   This visit occurred during the SARS-CoV-2 public health emergency.  Safety protocols were in place, including screening questions prior to the visit, additional usage of staff PPE, and extensive cleaning of exam room while observing appropriate contact time as indicated for disinfecting solutions.      Objective:   Physical Exam Vitals reviewed.  Constitutional:      General: [name] not in acute distress.    Appearance: [name] obese. [name] not ill-appearing.  HENT:     Head: Normocephalic and atraumatic.  Chest:     Chest wall: Tenderness (TTP over L anterior lower ribs) present.  Breasts:    Left: Tenderness (TTP over L lower breast and along upper chest wall) present. No swelling, bleeding, mass, nipple discharge or skin change.    Neurological:     Mental Status: [name] alert.          Assessment & Plan:   Breast pain- new to provider, pt reports sxs started ~1 month ago during her bout w/ COVID.  She reports excessive coughing during that time.  Where she is tender is consistent w/ suspensory ligament strain- likely due to poorly supportive bras and cough.  As pt has not had a mammogram since 2017 will get diagnostic mammo.  [name] not able to take NSAIDs due to her renal failure and tylenol is not helping.  She reports she is able to take Tramadol despite it being listed as an allergy.   Prescription sent along w/ instructions for supportive care.  Rib pain- new.  Anterior lower ribs.  Consistent w/ intercostal strain from cough.  Treat w/ Tramadol as needed for severe pain, heat/ice, and low dose muscle relaxer.  Pt is to let us know if things aren't improving.  Pt expressed understanding and is in agreement w/ plan.

## 2021-06-15 NOTE — Telephone Encounter (Signed)
Spoke to pt asked her how much Gabapentin was she taking? Pt said she was taking 300 mg twice a day. Told her I have to check with Dr. Jerline Pain and make sure amount okay with him. He is out of the office today but will be back tomorrow and if okay will send tomorrow. Pt verbalized understanding.

## 2021-06-15 NOTE — Telephone Encounter (Signed)
Dr. Jerline Pain, pt was taking Gabapentin 300 mg twice a day for her pain. Okay to send Rx for that amount and pt to discontinue Lyrica once starting Gabapentin?

## 2021-06-16 NOTE — Telephone Encounter (Signed)
Recommend she check with her nephrologist. Usual max dose for patients on dialysis is 3 times weekly.  [name]. Jerline Pain, MD 06/16/2021 4:57 PM

## 2021-06-17 ENCOUNTER — Other Ambulatory Visit: Payer: Self-pay | Admitting: Family Medicine

## 2021-06-17 DIAGNOSIS — N644 Mastodynia: Secondary | ICD-10-CM

## 2021-06-17 NOTE — Telephone Encounter (Signed)
Pt says that after Dialysis her hands and feet begin tingling and does not stop until taking Gabapentin. She says she does not take it daily, just as needed.

## 2021-06-18 ENCOUNTER — Other Ambulatory Visit: Payer: Self-pay | Admitting: Family Medicine

## 2021-06-18 MED ORDER — GABAPENTIN 300 MG PO CAPS: 300.0000 mg | ORAL_CAPSULE | Freq: Every day | ORAL | 3 refills | Status: DC | PRN

## 2021-06-18 NOTE — Telephone Encounter (Signed)
Rx sent in.  [name]. Jerline Pain, MD 06/18/2021 12:42 PM

## 2021-06-18 NOTE — Telephone Encounter (Signed)
Spoke to pt told her Rx was sent to pharmacy. Pt verbalized understanding. 

## 2021-07-10 ENCOUNTER — Encounter: Payer: Self-pay | Admitting: Family Medicine

## 2021-07-10 ENCOUNTER — Telehealth (INDEPENDENT_AMBULATORY_CARE_PROVIDER_SITE_OTHER): Payer: Managed Care, Other (non HMO) | Admitting: Family Medicine

## 2021-07-10 ENCOUNTER — Other Ambulatory Visit: Payer: Self-pay

## 2021-07-10 VITALS — Ht 69.0 in | Wt 216.1 lb

## 2021-07-10 DIAGNOSIS — Z794 Long term (current) use of insulin: Secondary | ICD-10-CM

## 2021-07-10 DIAGNOSIS — R053 Chronic cough: Secondary | ICD-10-CM

## 2021-07-10 DIAGNOSIS — E119 Type 2 diabetes mellitus without complications: Secondary | ICD-10-CM

## 2021-07-10 MED ORDER — HYDROCODONE BIT-HOMATROP MBR 5-1.5 MG/5ML PO SOLN: 5.0000 mL | Freq: Three times a day (TID) | ORAL | 0 refills | Status: DC | PRN

## 2021-07-10 MED ORDER — FREESTYLE LIBRE 14 DAY SENSOR MISC: 1.0000 | 5 refills | Status: DC

## 2021-07-10 NOTE — Assessment & Plan Note (Signed)
Last A1c at goal.  [name] on tresiba 26 units daily and NovoLog 6 units 3 times daily with meals.  We will send a prescription for her freestyle libre sensor today.  We can recheck A1c when she comes back in for her next office visit.

## 2021-07-10 NOTE — Assessment & Plan Note (Signed)
Saw pulmonology for this last year.  Had work-up which showed multiple nodules on her chest CT.  Appears if there was some discussion for possible bronchoscopy and further evaluation however patient has not yet followed up with them.  She was prescribed Hycodan last year by pulmonology which worked well.  We will refill this today and we instructed her to follow-up with pulmonology soon.

## 2021-07-10 NOTE — Progress Notes (Signed)
   LORIANN BOSSERMAN is a 50 y.o. female who presents today for a virtual office visit.  Assessment/Plan:  Chronic Problems Addressed Today: Chronic cough Saw pulmonology for this last year.  Had work-up which showed multiple nodules on her chest CT.  Appears if there was some discussion for possible bronchoscopy and further evaluation however patient has not yet followed up with them.  She was prescribed Hycodan last year by pulmonology which worked well.  We will refill this today and we instructed her to follow-up with pulmonology soon.   Diabetes mellitus, type II, insulin dependent ([place]), on CGM, with end organ damage: renal, neuropathy, gastroparesis, retinopathy Last A1c at goal.  [name] on tresiba 26 units daily and NovoLog 6 units 3 times daily with meals.  We will send a prescription for her freestyle libre sensor today.  We can recheck A1c when she comes back in for her next office visit.     Subjective:  HPI:  See A/P for status of chronic conditions.  She has had worsening of her chronic cough and needs refills today.       Objective/Observations  Physical Exam: Gen: NAD, resting comfortably Pulm: Normal work of breathing Neuro: Grossly normal, moves all extremities Psych: Normal affect and thought content  Virtual Visit via Video   I connected with Sherby Moncayo Scott-Hesler on 07/10/21 at  4:00 PM EDT by a video enabled telemedicine application and verified that I am speaking with the correct person using two identifiers. The limitations of evaluation and management by telemedicine and the availability of in person appointments were discussed. The patient expressed understanding and agreed to proceed.   Patient location: Home Provider location: [place] participating in the virtual visit: Myself and Patient     [name]. Jerline Pain, MD 07/10/2021 12:27 PM

## 2021-07-19 ENCOUNTER — Emergency Department (HOSPITAL_COMMUNITY): Payer: Medicare Other

## 2021-07-19 ENCOUNTER — Encounter (HOSPITAL_COMMUNITY): Payer: Self-pay

## 2021-07-19 ENCOUNTER — Other Ambulatory Visit: Payer: Self-pay

## 2021-07-19 ENCOUNTER — Emergency Department (HOSPITAL_COMMUNITY)
Admission: EM | Admit: 2021-07-19 | Discharge: 2021-07-20 | Disposition: A | Discharge status: Home / Self Care | Payer: Medicare Other | Attending: Emergency Medicine | Admitting: Emergency Medicine

## 2021-07-19 DIAGNOSIS — R6883 Chills (without fever): Secondary | ICD-10-CM | POA: Diagnosis not present

## 2021-07-19 DIAGNOSIS — E1140 Type 2 diabetes mellitus with diabetic neuropathy, unspecified: Secondary | ICD-10-CM | POA: Diagnosis not present

## 2021-07-19 DIAGNOSIS — R110 Nausea: Secondary | ICD-10-CM | POA: Insufficient documentation

## 2021-07-19 DIAGNOSIS — Z992 Dependence on renal dialysis: Secondary | ICD-10-CM | POA: Insufficient documentation

## 2021-07-19 DIAGNOSIS — R519 Headache, unspecified: Secondary | ICD-10-CM | POA: Diagnosis not present

## 2021-07-19 DIAGNOSIS — R059 Cough, unspecified: Secondary | ICD-10-CM | POA: Insufficient documentation

## 2021-07-19 DIAGNOSIS — J029 Acute pharyngitis, unspecified: Secondary | ICD-10-CM | POA: Diagnosis present

## 2021-07-19 DIAGNOSIS — R0902 Hypoxemia: Secondary | ICD-10-CM | POA: Diagnosis not present

## 2021-07-19 DIAGNOSIS — M791 Myalgia, unspecified site: Secondary | ICD-10-CM | POA: Diagnosis not present

## 2021-07-19 DIAGNOSIS — Z20822 Contact with and (suspected) exposure to covid-19: Secondary | ICD-10-CM | POA: Diagnosis not present

## 2021-07-19 DIAGNOSIS — I120 Hypertensive chronic kidney disease with stage 5 chronic kidney disease or end stage renal disease: Secondary | ICD-10-CM | POA: Diagnosis not present

## 2021-07-19 DIAGNOSIS — Z794 Long term (current) use of insulin: Secondary | ICD-10-CM | POA: Insufficient documentation

## 2021-07-19 DIAGNOSIS — R0602 Shortness of breath: Secondary | ICD-10-CM | POA: Diagnosis not present

## 2021-07-19 DIAGNOSIS — Z79899 Other long term (current) drug therapy: Secondary | ICD-10-CM | POA: Insufficient documentation

## 2021-07-19 DIAGNOSIS — N186 End stage renal disease: Secondary | ICD-10-CM | POA: Insufficient documentation

## 2021-07-19 DIAGNOSIS — E1122 Type 2 diabetes mellitus with diabetic chronic kidney disease: Secondary | ICD-10-CM | POA: Insufficient documentation

## 2021-07-19 NOTE — ED Provider Notes (Signed)
Emergency Medicine Provider Triage Evaluation Note  Heather [name] , a 50 y.o. female  was evaluated in triage.  Pt complains of SOB, cough, sore throat, HA, body aches started 3 days ago. Dialysis pt, feels as if she is gaining weight. Has been vaccinated.  Review of Systems  Positive: above Negative: Chest pain  Physical Exam  BP (!) 162/75 (BP Location: Right Arm)   Pulse 92   Temp 98.5 F (36.9 C) (Oral)   Resp 19   Ht 5\' 9"  (1.753 m)   Wt 98 kg   SpO2 100%   BMI 31.90 kg/m  Gen:   Awake, no distress   Resp:  Hypoxic to 88 percent, on 2L oxygen at 100 percent  MSK:   Moves extremities without difficulty    Medical Decision Making  Medically screening exam initiated at 11:42 PM.  Appropriate orders placed.  Heather [name] was informed that the remainder of the evaluation will be completed by another provider, this initial triage assessment does not replace that evaluation, and the importance of remaining in the ED until their evaluation is complete.  New oxygen requirement, pt to be moved back once room available.    Heather Client, PA-C 07/19/21 [id number]    Heather Dessert, MD 07/20/21 [phone]

## 2021-07-19 NOTE — ED Triage Notes (Signed)
Pt states starting 3 days ago she has experienced a sore throat, productive cough, headache, body aches, and chills. Pt states the [facility] occurs when she is [facility]. Pt was 88-89% on RA, even after coached breathing pt was 91%. Pt placed on 2L. Pt states the O2 has helped, pt denies hx of COPD. Pt states she had a CVA in 2019 and required O2 use at home. Pt denies being around anyone sick, however, goes to dialysis Tues, [place], and [place]. Pt states she is able to produce a very small amount of urine. Pt is A&Ox4.

## 2021-07-20 ENCOUNTER — Ambulatory Visit: Payer: Managed Care, Other (non HMO) | Admitting: Family Medicine

## 2021-07-20 DIAGNOSIS — J029 Acute pharyngitis, unspecified: Secondary | ICD-10-CM

## 2021-07-20 LAB — BASIC METABOLIC PANEL
Anion gap: 12 (ref 5–15)
BUN: 37 mg/dL — ABNORMAL HIGH (ref 6–20)
CO2: 28 mmol/L (ref 22–32)
Calcium: 9.1 mg/dL (ref 8.9–10.3)
Chloride: 94 mmol/L — ABNORMAL LOW (ref 98–111)
Creatinine, Ser: 9.94 mg/dL — ABNORMAL HIGH (ref 0.44–1.00)
GFR, Estimated: 4 mL/min — ABNORMAL LOW (ref >60)
Glucose, Bld: 250 mg/dL — ABNORMAL HIGH (ref 70–99)
Potassium: 4.7 mmol/L (ref 3.5–5.1)
Sodium: 134 mmol/L — ABNORMAL LOW (ref 135–145)

## 2021-07-20 LAB — RESP PANEL BY RT-PCR (FLU A&B, COVID) ARPGX2
Influenza A by PCR: NEGATIVE
Influenza B by PCR: NEGATIVE
SARS Coronavirus 2 by RT PCR: NEGATIVE

## 2021-07-20 LAB — GROUP A STREP BY PCR: Group A Strep by PCR: NOT DETECTED

## 2021-07-20 MED ORDER — AMOXICILLIN 500 MG PO CAPS: 500.0000 mg | ORAL_CAPSULE | Freq: Two times a day (BID) | ORAL | 0 refills | Status: AC

## 2021-07-20 MED ORDER — AMOXICILLIN 500 MG PO CAPS: 500.0000 mg | ORAL_CAPSULE | Freq: Two times a day (BID) | ORAL | 0 refills | Status: DC

## 2021-07-20 MED ORDER — LIDOCAINE VISCOUS HCL 2 % MT SOLN
15.0000 mL | OROMUCOSAL | Status: DC | PRN
  Administered 2021-07-20: 15 mL via OROMUCOSAL
  Filled 2021-07-20: qty 15

## 2021-07-20 MED ORDER — ONDANSETRON HCL 4 MG/2ML IJ SOLN
4.0000 mg | Freq: Once | INTRAMUSCULAR | Status: DC
  Filled 2021-07-20: qty 2

## 2021-07-20 MED ORDER — AMLODIPINE BESYLATE 5 MG PO TABS
10.0000 mg | ORAL_TABLET | Freq: Every day | ORAL | Status: DC
  Administered 2021-07-20: 10 mg via ORAL
  Filled 2021-07-20: qty 2

## 2021-07-20 MED ORDER — ALBUTEROL SULFATE HFA 108 (90 BASE) MCG/ACT IN AERS
2.0000 | INHALATION_SPRAY | RESPIRATORY_TRACT | Status: DC | PRN
  Administered 2021-07-20: 2 via RESPIRATORY_TRACT
  Filled 2021-07-20: qty 6.7

## 2021-07-20 MED ORDER — PHENOL 1.4 % MT LIQD
1.0000 | OROMUCOSAL | Status: DC | PRN
  Filled 2021-07-20: qty 177

## 2021-07-20 MED ORDER — PANTOPRAZOLE SODIUM 40 MG PO TBEC
40.0000 mg | DELAYED_RELEASE_TABLET | Freq: Once | ORAL | Status: AC
  Administered 2021-07-20: 40 mg via ORAL
  Filled 2021-07-20: qty 1

## 2021-07-20 MED ORDER — ONDANSETRON 8 MG PO TBDP
8.0000 mg | ORAL_TABLET | Freq: Once | ORAL | Status: AC
  Administered 2021-07-20: 8 mg via ORAL
  Filled 2021-07-20: qty 1

## 2021-07-20 NOTE — ED Notes (Signed)
IV team unable to insert piv.  Dr.Molpus aware.

## 2021-07-20 NOTE — ED Notes (Signed)
MD asked for RT to come do arterial stick on pt for labs, RT stated she is not allowed to perform the task unless it is for an ABG. MD made aware.

## 2021-07-20 NOTE — Consult Note (Signed)
Medical Consultation  Heather [name]:[id number] DOB: 01/05/1971 DOA: 07/19/2021 PCP: [name], MD   Requesting physician: Dr. Florina Ou Date of consultation: 07/20/21 Reason for consultation: sore throat, hypoxia  Impression/Recommendations URI     - initial plan was for admission to obs and complete w/u; but patient is declining admission     - her lung exam is clear, CXR is clear, her sats dropped to 89% on RA at presentation, but she says she was not symptomatic     - her primary concern is her sore throat (upper through appears clear) and is asking for a strep swap and discharge     - strep has been ordered     - chloraseptic has been ordered     - reasonable to treat her empirically for now; she has HD tomorrow, so she will have quick follow up     - spoke with EDP, she will be discharged to home     - COVID negative  HTN     - resume home regimen (amlodipine 10mg  given)  ESRD on HD     - continue TTS HD  DM2     - continue home regimen  At the end of my interview, the patient questioned need for admission. After explaining that I would be admitting her to obs at [facility] for w/u, she stated that she wanted to go home instead. I again offered full w/u and explained that the admission to [facility] would be because of her dialysis status. She declined admission. I discussed case with EDP. Per patient, she had mild symptoms at best when her sats were 88%. Her CXR  and lung exam is clear. I wonder if there is an element of chronic hypoxia in the form of OHS/OSA .She can likely be treated empirically for pharyngitis/URI at this point. She has HD follow up tomorrow. If her symptoms return, she can come back quickly to the ED. EDP to discharge to home.   Please contact me if I can be of assistance prior to discharge. Thank you for this consultation.  Chief Complaint: sore throat, chills  HPI:  Heather [name] is a 50 y.o. female with medical history significant of ESRD on HD,  DM2, HTN. Presenting with sore throat, chills, body aches. She reports her symptoms began 3 days ago. It started with a sore throat and lcough. She did not try any OTC meds or homeopathc interventions. Her symptoms progressed to loss of taste/smell, body aches, and headache. She was concerned that she may have contracted COVID again or strep throat. She denies any sick contacts. Her symptoms progressed through yesterday evening when she decided to come to the ED for assistance. She denies any other aggravating or alleviating factors.   Review of Systems:  Review of systems is negative for all not mentioned in HPI.   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[place]), nonhemorrhagic, inferior right cerebellum 12/03/2017   left side weakness in leg   Cyst of left ovary 01/12/2018   Diabetic neuropathy, painful ([place]), on low dose Gabapentin 07/13/2013   DM (diabetes mellitus), type 2, uncontrolled, with renal complications ([place]) 63/87/5643   DM2 (diabetes mellitus, type 2) ([place])    Dysfunction of left eustachian tube, with pusatile tinnitus 11/24/2017   Dyslipidemia associated with type  2 diabetes mellitus ([place]) 06/25/2016   ESRD (end stage renal disease) on dialysis [facility])    On hemodialysis in July 2019 via [facility] then switched to [facility] in Nov 2019.    ESRD with anemia (HCC)    Fibromyalgia    Gastroparesis due to DM    GERD (gastroesophageal reflux disease)    Headache    Hypertension associated with diabetes ([place]) 02/19/2011   IBS (irritable bowel syndrome)    Left-sided weakness 12/10/2017   .   Leiomyoma of uterus    Pancreatitis    PE (pulmonary thromboembolism) ([place]) 11/04/2019   Pneumonia    Proliferative diabetic retinopathy ([place]) 09/29/2015   Pronation deformity of both feet 06/14/2014   Reactive  depression 12/10/2017   .   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   Thromboembolism ([place]) 04/05/2018   Upper airway cough syndrome, with recs to stay off ACE and take Pepcid q hs 11/15/2016   Past Surgical History:  Procedure Laterality Date   A/V FISTULAGRAM Right 03/03/2020   Procedure: Venogram;  Surgeon: Waynetta Sandy, MD;  Location: [place] [facility];  Service: Cardiovascular;  Laterality: Right;   ARTERY REPAIR Right 02/17/2018   Procedure: EXPLORATION OF RIGHT BRACHIAL ARTERY;  Surgeon: Conrad Nibley, MD;  Location: [place];  Service: Vascular;  Laterality: Right;   ARTERY REPAIR Right 02/17/2018   Procedure: BRACHIAL ARTERY EXPLORATION AND TRHOMBECTOMY;  Surgeon: Conrad [place], MD;  Location: [place];  Service: Vascular;  Laterality: Right;   AV FISTULA PLACEMENT Left 05/09/2017   Procedure: INSERTION OF ARTERIOVENOUS (AV) GRAFT ARM (ARTEGRAFT);  Surgeon: Conrad [place], MD;  Location: [place];  Service: Vascular;  Laterality: Left;   AV FISTULA PLACEMENT Right 02/17/2018   Procedure: INSERTION OF ARTERIOVENOUS (AV) GORE-TEX GRAFT ARM RIGHT UPPER ARM;  Surgeon: Conrad Al[place]a, MD;  Location: [place];  Service: Vascular;  Laterality: Right;   AV FISTULA PLACEMENT Left 10/10/2019   Procedure: INSERTION OF LEFT ARTERIOVENOUS (AV) ARTEGRAFT GRAFT ARM;  Surgeon: Waynetta Sandy, MD;  Location: [place];  Service: Vascular;  Laterality: Left;   AV FISTULA PLACEMENT Right 03/19/2020   Procedure: INSERTION OF ARTERIOVENOUS (AV) GORE-TEX GRAFT IN RIGHT ARM AND VIABAHN STENT IN RIGHT AXILLARY ARTERY;  Surgeon: Waynetta Sandy, MD;  Location: [place];  Service: Vascular;  Laterality: Right;   [place] Left 08/31/2016   Procedure: BASILIC VEIN TRANSPOSITION FIRST STAGE;  Surgeon: Conrad [place], MD;  Location: [place];  Service: Vascular;  Laterality: Left;    CENTRAL VENOUS CATHETER INSERTION Left 07/24/2018   Procedure: INSERTION CENTRAL LINE ADULT;  Surgeon: Conrad [place], MD;  Location: [place];  Service: Vascular;  Laterality: Left;   EYE SURGERY     secondary to diabetic retinopathy    FOOT SURGERY Right    t"ook bone out- maybe hammer toe"   INSERTION OF DIALYSIS CATHETER Left 07/24/2018   Procedure: INSERTION OF TUNNELED DIALYSIS CATHETER;  Surgeon: Conrad Williams, MD;  Location: [place];  Service: Vascular;  Laterality: Left;   IR FLUORO GUIDE CV LINE RIGHT  07/21/2018   IR FLUORO GUIDE CV LINE RIGHT  06/01/2019   IR FLUORO GUIDE CV LINE RIGHT  05/05/2020   IR REMOVAL TUN CV CATH W/O FL  01/23/2021   IR US GUIDE VASC ACCESS RIGHT  07/21/2018   IR US GUIDE VASC ACCESS RIGHT  06/01/2019   LIGATION ARTERIOVENOUS GORTEX GRAFT Right 03/20/2020   Procedure: LIGATION ARM ARTERIOVENOUS GORTEX GRAFT With Patch Angioplasty, Thrombectomy;  Surgeon: Waynetta Sandy, MD;  Location: [place];  Service: Vascular;  Laterality: Right;   LIGATION OF ARTERIOVENOUS  FISTULA Left 05/09/2017   Procedure: LIGATION OF ARTERIOVENOUS  FISTULA;  Surgeon: Conrad [place], MD;  Location: [place];  Service: Vascular;  Laterality: Left;   LOOP RECORDER INSERTION N/A 12/05/2017   Procedure: LOOP RECORDER INSERTION;  Surgeon: Evans Lance, MD;  Location: [place] [facility];  Service: Cardiovascular;  Laterality: N/A;   MYOMECTOMY     REMOVAL OF GRAFT Right 02/17/2018   Procedure: REMOVAL OF RIGHT UPPER ARM ARTERIOVENOUS GRAFT;  Surgeon: Conrad Culloden, MD;  Location: [place];  Service: Vascular;  Laterality: Right;   REVISION OF ARTERIOVENOUS GORETEX GRAFT Left 07/24/2018   Procedure: REDO ARTERIOVENOUS GORETEX GRAFT;  Surgeon: Conrad Hackensack, MD;  Location: [place];  Service: Vascular;  Laterality: Left;   TEE WITHOUT CARDIOVERSION N/A 12/05/2017   Procedure: TRANSESOPHAGEAL ECHOCARDIOGRAM (TEE);  Surgeon: Sanda Klein, MD;  Location: [place];  Service: Cardiovascular;   Laterality: N/A;   UPPER EXTREMITY VENOGRAPHY Left 07/13/2018   Procedure: UPPER EXTREMITY VENOGRAPHY - Central & Left Arm;  Surgeon: Conrad Sumter, MD;  Location: [place] [facility];  Service: Cardiovascular;  Laterality: Left;   UPPER EXTREMITY VENOGRAPHY Bilateral 09/10/2019   Procedure: UPPER EXTREMITY VENOGRAPHY;  Surgeon: Waynetta Sandy, MD;  Location: [place] [facility];  Service: Cardiovascular;  Laterality: Bilateral;   UTERINE FIBROID SURGERY     VITRECTOMY Bilateral    Social History:  reports that she has never smoked. She has never used smokeless tobacco. She reports that she does not drink alcohol and does not use drugs.  Allergies  Allergen Reactions   Ibuprofen Other (See Comments)    CKD stage 3. Should avoid.   Dilaudid [Hydromorphone Hcl] Itching and Other (See Comments)    Can take with Benadryl.   Tramadol Itching   Family History  Problem Relation Age of Onset   Colon polyps Mother    Diabetes Mother    Heart murmur Father        history essentially unknown   Hypertension Father    Diabetes Sister    Kidney failure Sister        kidney transplant   Diabetes Brother    Retinal degeneration Brother    Heart murmur Son    Stroke Paternal Grandmother    Diabetes Sister     Prior to Admission medications   Medication Sig Start Date End Date Taking? Authorizing Provider  amLODipine (NORVASC) 5 MG tablet Take 1 tablet (5 mg total) by mouth daily. Patient taking differently: Take 10 mg by mouth daily. Reports MD increased to 10mg  07/16/20   [name], MD  amoxicillin (AMOXIL) 500 MG capsule Take 1 capsule (500 mg total) by mouth 2 (two) times daily for 7 days. 07/20/21 07/27/21  Carlisle Cater, PA-C  aspirin-acetaminophen-caffeine (EXCEDRIN MIGRAINE) [phone] MG tablet Take 1 tablet by mouth daily as needed for headache.    [provider]  cetirizine (ZYRTEC ALLERGY) 10 MG tablet Take 1 tablet (10 mg total) by mouth daily. Patient not  taking: Reported on 07/10/2021 09/26/20   Collene Gobble, MD  clindamycin (CLINDAGEL) 1 % gel APPLY TO AFFECTED AREA TWICE A DAY 04/27/21   [name], MD  Continuous Blood Gluc Sensor (FREESTYLE LIBRE 14 DAY  SENSOR) MISC 1 patch by Does not apply route every 14 (fourteen) days. 07/10/21   [name], MD  gabapentin (NEURONTIN) 300 MG capsule Take 1 capsule (300 mg total) by mouth daily as needed. Take after dialysis. Do not take more than three times weekly. 06/18/21   [name], MD  HYDROcodone bit-homatropine [facility]) 5-1.5 MG/5ML syrup Take 5 mLs by mouth every 8 (eight) hours as needed for cough. 07/10/21   [name], MD  insulin aspart (NOVOLOG FLEXPEN) 100 UNIT/ML FlexPen Inject 6 Units into the skin 3 (three) times daily with meals. Sliding scale 07/16/20   [name], MD  insulin degludec (TRESIBA FLEXTOUCH) 100 UNIT/ML SOPN FlexTouch Pen Inject 0.26 mLs (26 Units total) into the skin at bedtime. 12/26/19   Shamleffer, Melanie Crazier, MD  Insulin Pen Needle 29G X 5MM MISC 1 Device by Does not apply route 4 (four) times daily. 12/26/19   Shamleffer, Melanie Crazier, MD  lisinopril (ZESTRIL) 10 MG tablet Take 1 tablet (10 mg total) by mouth 3 (three) times a week. After dialysis. 01/19/21   [name], MD  lubiprostone (AMITIZA) 24 MCG capsule Take 1 capsule (24 mcg total) by mouth 2 (two) times daily with a meal. Patient taking differently: Take 24 mcg by mouth daily. 02/23/19   Love, Ivan Anchors, PA-C  methocarbamol (ROBAXIN) 500 MG tablet Take 1 tablet (500 mg total) by mouth every 8 (eight) hours as needed for muscle spasms. Patient not taking: Reported on 07/10/2021 06/12/21   Midge Minium, MD  midodrine (PROAMATINE) 5 MG tablet TAKE 1 TABLET (5 MG TOTAL) BY MOUTH 2 (TWO) TIMES DAILY WITH A MEAL. Patient taking differently: Take 5 mg by mouth daily as needed (hypotension). 07/04/19   Briscoe Deutscher, DO  pregabalin (LYRICA) 75 MG capsule Take 1 capsule (75 mg total)  by mouth daily. Patient not taking: Reported on 07/10/2021 03/31/21   [name], MD  promethazine (PHENERGAN) 25 MG tablet Take 1 tablet (25 mg total) by mouth every 8 (eight) hours as needed for nausea or vomiting. Patient not taking: Reported on 07/10/2021 03/31/21   [name], MD   Physical Exam: Blood pressure (!) 196/96, pulse 91, temperature 98.5 F (36.9 C), temperature source Oral, resp. rate 18, height 5\' 9"  (1.753 m), weight 98 kg, last menstrual period 02/06/2020, SpO2 100 %. Vitals:   07/20/21 0700 07/20/21 0743  BP: (!) 145/62 (!) 196/96  Pulse: 91   Resp: 18   Temp:    SpO2: 100%     General: 50 y.o. female resting in bed in NAD Eyes: PERRL, normal sclera ENMT: Nares patent w/o discharge, orophaynx clear, dentition normal, ears w/o discharge/lesions/ulcers Neck: Supple, trachea midline Cardiovascular: RRR, +S1, S2, no m/g/r, equal pulses throughout Respiratory: CTABL, no w/r/r, normal WOB GI: BS+, NDNT, no masses noted, no organomegaly noted MSK: No e/c/c; RBKA Skin: No rashes, bruises, ulcerations noted Neuro: A&O x 3, no focal deficits Psyc: Appropriate interaction and affect, calm/cooperative  Labs on Admission:  Basic Metabolic Panel: Recent Labs  Lab 07/20/21 0317  NA 134*  K 4.7  CL 94*  CO2 28  GLUCOSE 250*  BUN 37*  CREATININE 9.94*  CALCIUM 9.1   Liver Function Tests: No results for input(s): AST, ALT, ALKPHOS, BILITOT, PROT, ALBUMIN in the last 168 hours. No results for input(s): LIPASE, AMYLASE in the last 168 hours. No results for input(s): AMMONIA in the last 168 hours. CBC: No results for input(s): WBC,  NEUTROABS, HGB, HCT, MCV, PLT in the last 168 hours. Cardiac Enzymes: No results for input(s): CKTOTAL, CKMB, CKMBINDEX, TROPONINI in the last 168 hours. BNP: Invalid input(s): POCBNP CBG: No results for input(s): GLUCAP in the last 168 hours.  Radiological Exams on Admission: DG Chest Port 1 View  Result Date:  07/20/2021 CLINICAL DATA:  Shortness of EXAM: PORTABLE CHEST 1 VIEW COMPARISON:  06/05/2020 FINDINGS: The heart size and mediastinal contours are within normal limits. Both lungs are clear. The visualized skeletal structures are unremarkable. IMPRESSION: No active disease. Electronically Signed   By: Ulyses Jarred M.D.   On: 07/20/2021 00:11    EKG: Independently reviewed. Sinus, no st elevations  Time spent: 25 minutes  Conn Trombetta A Frady Taddeo DO [facility]  If 7PM-7AM, please contact night-coverage [email or web address] 07/20/2021, 7:50 AM

## 2021-07-20 NOTE — ED Provider Notes (Signed)
Heather [name] was seen and assessed by Dr. Marylyn Ishihara with [facility].  [name] is unwilling to stay in the hospital.  Dr. Marylyn Ishihara thinks that her intermittent hypoxia is likely her baseline and that she may require CPAP at night.  He feels she is well-appearing in general, and he would recommend empiric treatment for possible bacterial pharyngitis.  The patient has dialysis on Tuesday, 1 day from today, and she will have labs done at that time.  She was given careful instructions to return if she worsens in the meantime.   Arnaldo Natal, MD 07/20/21 [phone]

## 2021-07-20 NOTE — ED Provider Notes (Signed)
[facility] DEPT Provider Note: [name], MD, FACEP  CSN: [id number] MRN: [id number] ARRIVAL: 07/19/21 at 2250 ROOM: WA09/WA09   CHIEF COMPLAINT  Sore Throat   HISTORY OF PRESENT ILLNESS  07/20/21 1:30 AM Heather [name] is a 50 y.o. female with a 3-day history of URI symptoms.  Specifically she has had a productive cough, headache, body aches and chills.  Her principal complaint to be right now is sore throat which she rates as a 10 out of 10, worse with swallowing.  She has had decreased oral intake due to the sore throat as well as loss of taste and smell.  She feels generally weak and has had nausea but no vomiting.  Her oxygen saturation in triage was 88 to 89% on room air and when her breathing was coached it only went up to 91%.  She was placed on 2 L by nasal cannula with return of her oxygen saturation to normal.  [name] a renal patient who goes to dialysis Tuesdays, Thursdays and Saturdays.   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[place]), nonhemorrhagic, inferior right cerebellum 12/03/2017   left side weakness in leg   Cyst of left ovary 01/12/2018   Diabetic neuropathy, painful ([place]), on low dose Gabapentin 07/13/2013   DM (diabetes mellitus), type 2, uncontrolled, with renal complications ([place]) 52/84/1324   DM2 (diabetes mellitus, type 2) ([place])    Dysfunction of left eustachian tube, with pusatile tinnitus 11/24/2017   Dyslipidemia associated with type 2 diabetes mellitus ([place]) 06/25/2016   ESRD (end stage renal disease) on dialysis [facility])    On hemodialysis in July 2019 via [facility] then switched to [facility] in Nov 2019.    ESRD with anemia (HCC)    Fibromyalgia    Gastroparesis due to DM    GERD (gastroesophageal reflux disease)    Headache    Hypertension associated  with diabetes ([place]) 02/19/2011   IBS (irritable bowel syndrome)    Left-sided weakness 12/10/2017   .   Leiomyoma of uterus    Pancreatitis    PE (pulmonary thromboembolism) ([place]) 11/04/2019   Pneumonia    Proliferative diabetic retinopathy ([place]) 09/29/2015   Pronation deformity of both feet 06/14/2014   Reactive depression 12/10/2017   .   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[place]) 02/17/2018   Thromboembolism ([place]) 04/05/2018   Upper airway cough syndrome, with recs to stay off ACE and take Pepcid q hs 11/15/2016    Past Surgical History:  Procedure Laterality Date   A/V FISTULAGRAM Right 03/03/2020   Procedure: Venogram;  Surgeon: Waynetta Sandy, MD;  Location: [place] CV LAB;  Service: Cardiovascular;  Laterality: Right;   ARTERY REPAIR Right 02/17/2018   Procedure: EXPLORATION OF RIGHT BRACHIAL ARTERY;  Surgeon: Conrad Galesburg, MD;  Location: [place];  Service: Vascular;  Laterality: Right;   ARTERY REPAIR Right 02/17/2018   Procedure: BRACHIAL ARTERY EXPLORATION AND TRHOMBECTOMY;  Surgeon: Conrad Wabasso, MD;  Location: [place];  Service: Vascular;  Laterality: Right;   AV FISTULA PLACEMENT Left 05/09/2017   Procedure: INSERTION OF ARTERIOVENOUS (AV) GRAFT ARM (ARTEGRAFT);  Surgeon: Conrad , MD;  Location[facility];  Service: Vascular;  Laterality: Left;   AV FISTULA  PLACEMENT Right 02/17/2018   Procedure: INSERTION OF ARTERIOVENOUS (AV) GORE-TEX GRAFT ARM RIGHT UPPER ARM;  Surgeon: Conrad Laporte, MD;  Location: [place];  Service: Vascular;  Laterality: Right;   AV FISTULA PLACEMENT Left 10/10/2019   Procedure: INSERTION OF LEFT ARTERIOVENOUS (AV) ARTEGRAFT GRAFT ARM;  Surgeon: Waynetta Sandy, MD;  Location: [place];  Service: Vascular;  Laterality: Left;   AV FISTULA PLACEMENT Right 03/19/2020   Procedure: INSERTION OF ARTERIOVENOUS (AV) GORE-TEX GRAFT IN RIGHT ARM AND  VIABAHN STENT IN RIGHT AXILLARY ARTERY;  Surgeon: Waynetta Sandy, MD;  Location: [place];  Service: Vascular;  Laterality: Right;   [place] Left 08/31/2016   Procedure: BASILIC VEIN TRANSPOSITION FIRST STAGE;  Surgeon: Conrad [place], MD;  Location: [place];  Service: Vascular;  Laterality: Left;   CENTRAL VENOUS CATHETER INSERTION Left 07/24/2018   Procedure: INSERTION CENTRAL LINE ADULT;  Surgeon: Conrad Palmyra, MD;  Location: [place];  Service: Vascular;  Laterality: Left;   EYE SURGERY     secondary to diabetic retinopathy    FOOT SURGERY Right    t"ook bone out- maybe hammer toe"   INSERTION OF DIALYSIS CATHETER Left 07/24/2018   Procedure: INSERTION OF TUNNELED DIALYSIS CATHETER;  Surgeon: Conrad [place] Lake-Leto, MD;  Location: [place];  Service: Vascular;  Laterality: Left;   IR FLUORO GUIDE CV LINE RIGHT  07/21/2018   IR FLUORO GUIDE CV LINE RIGHT  06/01/2019   IR FLUORO GUIDE CV LINE RIGHT  05/05/2020   IR REMOVAL TUN CV CATH W/O FL  01/23/2021   IR US GUIDE VASC ACCESS RIGHT  07/21/2018   IR US GUIDE VASC ACCESS RIGHT  06/01/2019   LIGATION ARTERIOVENOUS GORTEX GRAFT Right 03/20/2020   Procedure: LIGATION ARM ARTERIOVENOUS GORTEX GRAFT With Patch Angioplasty, Thrombectomy;  Surgeon: Waynetta Sandy, MD;  Location: [place];  Service: Vascular;  Laterality: Right;   LIGATION OF ARTERIOVENOUS  FISTULA Left 05/09/2017   Procedure: LIGATION OF ARTERIOVENOUS  FISTULA;  Surgeon: Conrad Breckenridge, MD;  Location: [place];  Service: Vascular;  Laterality: Left;   LOOP RECORDER INSERTION N/A 12/05/2017   Procedure: LOOP RECORDER INSERTION;  Surgeon: Evans Lance, MD;  Location: [place] [facility] LAB;  Service: Cardiovascular;  Laterality: N/A;   MYOMECTOMY     REMOVAL OF GRAFT Right 02/17/2018   Procedure: REMOVAL OF RIGHT UPPER ARM ARTERIOVENOUS GRAFT;  Surgeon: Conrad Cooke, MD;  Location: [place];  Service: Vascular;  Laterality: Right;   REVISION OF ARTERIOVENOUS GORETEX GRAFT Left  07/24/2018   Procedure: REDO ARTERIOVENOUS GORETEX GRAFT;  Surgeon: Conrad Hughes, MD;  Location: [place];  Service: Vascular;  Laterality: Left;   TEE WITHOUT CARDIOVERSION N/A 12/05/2017   Procedure: TRANSESOPHAGEAL ECHOCARDIOGRAM (TEE);  Surgeon: Sanda Klein, MD;  Location: [place];  Service: Cardiovascular;  Laterality: N/A;   UPPER EXTREMITY VENOGRAPHY Left 07/13/2018   Procedure: UPPER EXTREMITY VENOGRAPHY - Central & Left Arm;  Surgeon: Conrad Winthrop, MD;  Location: [place] CV LAB;  Service: Cardiovascular;  Laterality: Left;   UPPER EXTREMITY VENOGRAPHY Bilateral 09/10/2019   Procedure: UPPER EXTREMITY VENOGRAPHY;  Surgeon: Waynetta Sandy, MD;  Location: [place] [facility];  Service: Cardiovascular;  Laterality: Bilateral;   UTERINE FIBROID SURGERY     VITRECTOMY Bilateral     Family History  Problem Relation Age of Onset   Colon polyps Mother    Diabetes Mother    Heart murmur Father  history essentially unknown   Hypertension Father    Diabetes Sister    Kidney failure Sister        kidney transplant   Diabetes Brother    Retinal degeneration Brother    Heart murmur Son    Stroke Paternal Grandmother    Diabetes Sister     Social History   Tobacco Use   Smoking status: Never   Smokeless tobacco: Never  Vaping Use   Vaping Use: Never used  Substance Use Topics   Alcohol use: No    Alcohol/week: 0.0 standard drinks   Drug use: No    Prior to Admission medications   Medication Sig Start Date End Date Taking? Authorizing Provider  amLODipine (NORVASC) 5 MG tablet Take 1 tablet (5 mg total) by mouth daily. Patient taking differently: Take 10 mg by mouth daily. Reports MD increased to 10mg  07/16/20   [name], MD  aspirin-acetaminophen-caffeine [facility] MIGRAINE) [phone] MG tablet Take 1 tablet by mouth daily as needed for headache.    [provider]  cetirizine (ZYRTEC ALLERGY) 10 MG tablet Take 1 tablet (10 mg total) by  mouth daily. Patient not taking: Reported on 07/10/2021 09/26/20   Collene Gobble, MD  clindamycin (CLINDAGEL) 1 % gel APPLY TO AFFECTED AREA TWICE A DAY 04/27/21   [name], MD  Continuous Blood Gluc Sensor (FREESTYLE LIBRE 14 DAY SENSOR) MISC 1 patch by Does not apply route every 14 (fourteen) days. 07/10/21   [name], MD  gabapentin (NEURONTIN) 300 MG capsule Take 1 capsule (300 mg total) by mouth daily as needed. Take after dialysis. Do not take more than three times weekly. 06/18/21   [name], MD  HYDROcodone bit-homatropine [facility]) 5-1.5 MG/5ML syrup Take 5 mLs by mouth every 8 (eight) hours as needed for cough. 07/10/21   [name], MD  insulin aspart (NOVOLOG FLEXPEN) 100 UNIT/ML FlexPen Inject 6 Units into the skin 3 (three) times daily with meals. Sliding scale 07/16/20   [name], MD  insulin degludec (TRESIBA FLEXTOUCH) 100 UNIT/ML SOPN FlexTouch Pen Inject 0.26 mLs (26 Units total) into the skin at bedtime. 12/26/19   Shamleffer, Melanie Crazier, MD  Insulin Pen Needle 29G X 5MM MISC 1 Device by Does not apply route 4 (four) times daily. 12/26/19   Shamleffer, Melanie Crazier, MD  lisinopril (ZESTRIL) 10 MG tablet Take 1 tablet (10 mg total) by mouth 3 (three) times a week. After dialysis. 01/19/21   [name], MD  lubiprostone (AMITIZA) 24 MCG capsule Take 1 capsule (24 mcg total) by mouth 2 (two) times daily with a meal. Patient taking differently: Take 24 mcg by mouth daily. 02/23/19   Love, Ivan Anchors, PA-C  methocarbamol (ROBAXIN) 500 MG tablet Take 1 tablet (500 mg total) by mouth every 8 (eight) hours as needed for muscle spasms. Patient not taking: Reported on 07/10/2021 06/12/21   Midge Minium, MD  midodrine (PROAMATINE) 5 MG tablet TAKE 1 TABLET (5 MG TOTAL) BY MOUTH 2 (TWO) TIMES DAILY WITH A MEAL. Patient taking differently: Take 5 mg by mouth daily as needed (hypotension). 07/04/19   Briscoe Deutscher, DO  pregabalin (LYRICA) 75 MG capsule  Take 1 capsule (75 mg total) by mouth daily. Patient not taking: Reported on 07/10/2021 03/31/21   [name], MD  promethazine (PHENERGAN) 25 MG tablet Take 1 tablet (25 mg total) by mouth every 8 (eight) hours as needed for nausea or vomiting. Patient not  taking: Reported on 07/10/2021 03/31/21   [name], MD    Allergies Ibuprofen, Dilaudid [hydromorphone hcl], and Tramadol   REVIEW OF SYSTEMS  Negative except as noted here or in the History of Present Illness.   PHYSICAL EXAMINATION  Initial Vital Signs Blood pressure (!) 162/75, pulse 92, temperature 98.5 F (36.9 C), temperature source Oral, resp. rate 19, height 5\' 9"  (1.753 m), weight 98 kg, last menstrual period 02/06/2020, SpO2 100 %.  Examination General: Well-developed, well-nourished female in no acute distress; appearance consistent with age of record HENT: normocephalic; atraumatic; mild prominence of tonsillar crypts without significant erythema or exudate Eyes: pupils equal, round and reactive to light; extraocular muscles intact Neck: supple; no lymphadenopathy Heart: regular rate and rhythm Lungs: clear to auscultation bilaterally; cough on deep breathing Abdomen: soft; nondistended; nontender; bowel sounds present Extremities: Right BKA; dialysis fistula left lower forearm with pulse but no thrill Neurologic: Awake, alert and oriented; motor function intact in all extremities and symmetric; no facial droop Skin: Warm and dry Psychiatric: Flat affect   RESULTS  Summary of this visit's results, reviewed and interpreted by myself:   EKG Interpretation  Date/Time:  Monday July 20 2021 01:24:29 EDT Ventricular Rate:  86 PR Interval:  143 QRS Duration: 90 QT Interval:  377 QTC Calculation: 451 R Axis:   39 Text Interpretation: Sinus rhythm Low voltage, precordial leads No significant change was found Confirmed by Itai Barbian, Jenny Reichmann [phone]) on 07/20/2021 1:29:02 AM       Laboratory Studies: Results for  orders placed or performed during the hospital encounter of 07/19/21 (from the past 24 hour(s))  Resp Panel by RT-PCR (Flu A&B, Covid) Nasopharyngeal Swab     Status: None   Collection Time: 07/20/21  2:55 AM   Specimen: Nasopharyngeal Swab; Nasopharyngeal(NP) swabs in vial transport medium  Result Value Ref Range   SARS Coronavirus 2 by RT PCR NEGATIVE NEGATIVE   Influenza A by PCR NEGATIVE NEGATIVE   Influenza B by PCR NEGATIVE NEGATIVE  Basic metabolic panel     Status: Abnormal   Collection Time: 07/20/21  3:17 AM  Result Value Ref Range   Sodium 134 (L) 135 - 145 mmol/L   Potassium 4.7 3.5 - 5.1 mmol/L   Chloride 94 (L) 98 - 111 mmol/L   CO2 28 22 - 32 mmol/L   Glucose, Bld 250 (H) 70 - 99 mg/dL   BUN 37 (H) 6 - 20 mg/dL   Creatinine, Ser 9.94 (H) 0.44 - 1.00 mg/dL   Calcium 9.1 8.9 - 10.3 mg/dL   GFR, Estimated 4 (L) >60 mL/min   Anion gap 12 5 - 15   *Note: Due to a large number of results and/or encounters for the requested time period, some results have not been displayed. A complete set of results can be found in Results Review.   Imaging Studies: DG Chest Port 1 View  Result Date: 07/20/2021 CLINICAL DATA:  Shortness of EXAM: PORTABLE CHEST 1 VIEW COMPARISON:  06/05/2020 FINDINGS: The heart size and mediastinal contours are within normal limits. Both lungs are clear. The visualized skeletal structures are unremarkable. IMPRESSION: No active disease. Electronically Signed   By: Ulyses Jarred M.D.   On: 07/20/2021 00:11    ED COURSE and MDM  Nursing notes, initial and subsequent vitals signs, including pulse oximetry, reviewed and interpreted by myself.  Vitals:   07/20/21 0315 07/20/21 0330 07/20/21 0345 07/20/21 0400  BP:  (!) 169/87  (!) 162/85  Pulse: 89 89  90 88  Resp: 14 18 18 11   Temp:      TempSrc:      SpO2: 99% 100% 100% 97%  Weight:      Height:       Medications  ondansetron (ZOFRAN) injection 4 mg (0 mg Intravenous Hold 07/20/21 0328)  albuterol  (VENTOLIN HFA) 108 (90 Base) MCG/ACT inhaler 2 puff (2 puffs Inhalation Given 07/20/21 0246)  lidocaine (XYLOCAINE) 2 % viscous mouth solution 15 mL (has no administration in time range)  pantoprazole (PROTONIX) EC tablet 40 mg (has no administration in time range)  ondansetron (ZOFRAN-ODT) disintegrating tablet 8 mg (8 mg Oral Given 07/20/21 0328)   5:16 AM The cause of the patient's acute dyspnea could be due to volume overload suggesting the need for dialysis.  She did have COVID in May and this could represent long COVID symptoms.  The cause of her throat sore throat is less clear.  It could be due to acid reflux and we are starting a PPI.  Patient's care discussed with the hospitalist on-call and the plan is to have her admitted by the morning team.   PROCEDURES  Procedures   ED DIAGNOSES     ICD-10-CM   1. Sore throat  J02.9     2. Shortness of breath  R06.02     3. Hypoxia  R09.02          Shanon Rosser, MD 07/20/21 [phone]

## 2021-07-20 NOTE — ED Notes (Signed)
Attempt once to collect labs unable  

## 2021-07-20 NOTE — ED Notes (Signed)
Pt difficult stick, unsuccessful attempt for lab draw/piv.

## 2021-07-20 NOTE — ED Notes (Signed)
Pt spoke w Dr.Kyle and did not want to be admitted and stay at hospital, wants to go home. Provider sent rx to pharmacy, strep swab sent to lab, advised pt that she will receive a phone call with any abnormal results. Pt understood, advised to come back w any worsening s/s, pt is going to dialysis tomorrow, pt ready for discharge

## 2021-07-29 ENCOUNTER — Other Ambulatory Visit: Payer: Self-pay

## 2021-07-29 ENCOUNTER — Inpatient Hospital Stay: Admission: RE | Admit: 2021-07-29 | Payer: Managed Care, Other (non HMO) | Source: Ambulatory Visit

## 2021-07-29 ENCOUNTER — Emergency Department (HOSPITAL_BASED_OUTPATIENT_CLINIC_OR_DEPARTMENT_OTHER): Payer: Medicare Other

## 2021-07-29 ENCOUNTER — Encounter: Payer: Self-pay | Admitting: Family Medicine

## 2021-07-29 ENCOUNTER — Encounter (HOSPITAL_BASED_OUTPATIENT_CLINIC_OR_DEPARTMENT_OTHER): Payer: Self-pay | Admitting: *Deleted

## 2021-07-29 ENCOUNTER — Emergency Department (HOSPITAL_BASED_OUTPATIENT_CLINIC_OR_DEPARTMENT_OTHER)
Admission: EM | Admit: 2021-07-29 | Discharge: 2021-07-29 | Disposition: A | Discharge status: Home / Self Care | Payer: Medicare Other | Attending: Emergency Medicine | Admitting: Emergency Medicine

## 2021-07-29 DIAGNOSIS — N186 End stage renal disease: Secondary | ICD-10-CM | POA: Insufficient documentation

## 2021-07-29 DIAGNOSIS — Z992 Dependence on renal dialysis: Secondary | ICD-10-CM | POA: Insufficient documentation

## 2021-07-29 DIAGNOSIS — J029 Acute pharyngitis, unspecified: Secondary | ICD-10-CM | POA: Insufficient documentation

## 2021-07-29 DIAGNOSIS — R079 Chest pain, unspecified: Secondary | ICD-10-CM | POA: Insufficient documentation

## 2021-07-29 DIAGNOSIS — Z794 Long term (current) use of insulin: Secondary | ICD-10-CM | POA: Diagnosis not present

## 2021-07-29 DIAGNOSIS — I120 Hypertensive chronic kidney disease with stage 5 chronic kidney disease or end stage renal disease: Secondary | ICD-10-CM | POA: Diagnosis not present

## 2021-07-29 DIAGNOSIS — Z79899 Other long term (current) drug therapy: Secondary | ICD-10-CM | POA: Diagnosis not present

## 2021-07-29 DIAGNOSIS — E1122 Type 2 diabetes mellitus with diabetic chronic kidney disease: Secondary | ICD-10-CM | POA: Diagnosis not present

## 2021-07-29 DIAGNOSIS — R053 Chronic cough: Secondary | ICD-10-CM

## 2021-07-29 LAB — BASIC METABOLIC PANEL
Anion gap: 15 (ref 5–15)
BUN: 59 mg/dL — ABNORMAL HIGH (ref 6–20)
CO2: 25 mmol/L (ref 22–32)
Calcium: 8.5 mg/dL — ABNORMAL LOW (ref 8.9–10.3)
Chloride: 96 mmol/L — ABNORMAL LOW (ref 98–111)
Creatinine, Ser: 14.89 mg/dL — ABNORMAL HIGH (ref 0.44–1.00)
GFR, Estimated: 3 mL/min — ABNORMAL LOW (ref >60)
Glucose, Bld: 115 mg/dL — ABNORMAL HIGH (ref 70–99)
Potassium: 4.3 mmol/L (ref 3.5–5.1)
Sodium: 136 mmol/L (ref 135–145)

## 2021-07-29 LAB — CBC WITH DIFFERENTIAL/PLATELET
Abs Immature Granulocytes: 0.06 10*3/uL (ref 0.00–0.07)
Basophils Absolute: 0 10*3/uL (ref 0.0–0.1)
Basophils Relative: 0 %
Eosinophils Absolute: 0.7 10*3/uL — ABNORMAL HIGH (ref 0.0–0.5)
Eosinophils Relative: 9 %
HCT: 30.3 % — ABNORMAL LOW (ref 36.0–46.0)
Hemoglobin: 9.5 g/dL — ABNORMAL LOW (ref 12.0–15.0)
Immature Granulocytes: 1 %
Lymphocytes Relative: 14 %
Lymphs Abs: 1.1 10*3/uL (ref 0.7–4.0)
MCH: 30.3 pg (ref 26.0–34.0)
MCHC: 31.4 g/dL (ref 30.0–36.0)
MCV: 96.5 fL (ref 80.0–100.0)
Monocytes Absolute: 0.6 10*3/uL (ref 0.1–1.0)
Monocytes Relative: 8 %
Neutro Abs: 5.5 10*3/uL (ref 1.7–7.7)
Neutrophils Relative %: 68 %
Platelets: 269 10*3/uL (ref 150–400)
RBC: 3.14 MIL/uL — ABNORMAL LOW (ref 3.87–5.11)
RDW: 14.6 % (ref 11.5–15.5)
WBC: 8 10*3/uL (ref 4.0–10.5)
nRBC: 0 % (ref 0.0–0.2)

## 2021-07-29 MED ORDER — BENZONATATE 100 MG PO CAPS: 100.0000 mg | ORAL_CAPSULE | Freq: Three times a day (TID) | ORAL | 0 refills | Status: DC

## 2021-07-29 NOTE — ED Notes (Signed)
Attempted [name] IV with no success. Britni, PA and Dr. Roslynn Amble notified.

## 2021-07-29 NOTE — Progress Notes (Signed)
Was contacted by ED provider.  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  Rarely acute cause of decompensation or decline.  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  Reviewed CT scan today compared to 10/2020 on my interpretation with similar to slightly progressive appearance of interstitial infiltrates, tree-in-bud nodularity, mild bronchiectasis in the right upper lobe.  Defer additional work-up and evaluation and treatment to ED provider as I was not asked to comment further on care.

## 2021-07-29 NOTE — Discharge Instructions (Addendum)
Follow up with ear nose and throat provider, Dr. Wilburn Cornelia.  Call to schedule an appointment for your sore throat  Follow-up with Dr. Lamonte Sakai, the pulmonologist for reevaluation of your cough  Make sure to go to dialysis tomorrow as your kidney function was increased today  Return for new or worsening symptoms

## 2021-07-29 NOTE — ED Provider Notes (Signed)
Heather [name]   CSN: [id number] Arrival date & time: 07/29/21  1423     History Chief Complaint  Patient presents with   Cough    Heather [name] is a 50 y.o. female with past medical history significant for hypertension, diabetes, ESRD, Tuesday Thursday Saturday, missed yesterday, prior PE who presents for evaluation of sore throat.  Has had a sore throat x3 weeks.  Initially seen in ED 1 week ago with similar complaints.  Heather [name] has had multiple negative COVID tests as well as strep test.  Was treated empirically with amoxicillin at prior ED visit.  Heather [name] states her sore throat has not improved.  Sore throat worse with swallowing. Heather [name] does admit to a cough which Heather [name] states is a chronic cough. Previously followed by Pulm. Heather [name] denies any hemoptysis.  No chest pain, shortness of breath, LE edema.  Feels generalized fatigue and  run down.  Did not go to dialysis yesterday.  No abdominal pain.  No recent emesis.  No back pain.  Denies additional aggravating or alleviating factors.  No GERD symptoms.  History obtained from patient and past medical records.  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[place]), nonhemorrhagic, inferior right cerebellum 12/03/2017   left side weakness in leg   Cyst of left ovary 01/12/2018   Diabetic neuropathy, painful ([place]), on low dose Gabapentin 07/13/2013   DM (diabetes mellitus), type 2, uncontrolled, with renal complications ([place]) 24/23/5361   DM2 (diabetes mellitus, type 2) ([place])    Dysfunction of left eustachian tube, with pusatile tinnitus 11/24/2017   Dyslipidemia associated with type 2 diabetes mellitus ([place]) 06/25/2016   ESRD (end stage renal disease) on dialysis [facility])    On hemodialysis  in July 2019 via [facility] then switched to [facility] in Nov 2019.    ESRD with anemia (HCC)    Fibromyalgia    Gastroparesis due to DM    GERD (gastroesophageal reflux disease)    Headache    Hypertension associated with diabetes ([place]) 02/19/2011   IBS (irritable bowel syndrome)    Left-sided weakness 12/10/2017   .   Leiomyoma of uterus    Pancreatitis    PE (pulmonary thromboembolism) ([place]) 11/04/2019   Pneumonia    Proliferative diabetic retinopathy ([place]) 09/29/2015   Pronation deformity of both feet 06/14/2014   Reactive depression 12/10/2017   .   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[place]) 02/17/2018   Thromboembolism ([place]) 04/05/2018   Upper airway cough syndrome, with recs to stay off ACE and take Pepcid q hs 11/15/2016    Patient Active Problem List   Diagnosis Date Noted   S/P BKA (below knee amputation), right ([place]) 03/31/2021   Impaired mobility and ADLs 03/25/2021   CVA (cerebral vascular accident) ([place]) 03/04/2021   Pulmonary embolus ([place]) 03/04/2021   Charcot ankle, right 02/23/2021   Hx of cardiac arrest 11/12/2020   Abnormal CT of the chest 09/26/2020   Allergy, unspecified, initial encounter 09/22/2020   Anaphylactic shock, unspecified, initial encounter 09/22/2020   Fluid overload, unspecified 06/10/2020   Other mechanical complication of other cardiac and vascular devices and implants, initial encounter ([place]) 05/10/2020   Positive  ANA (antinuclear antibody) 04/16/2020   ESRD on dialysis [facility])    Hemiplegia and hemiparesis following cerebral infarction affecting left non-dominant side ([place]) 12/29/2018   IBS (irritable bowel syndrome) 08/06/2017   Dyslipidemia associated with type 2 diabetes mellitus ([place]) 06/25/2016   Proliferative diabetic retinopathy ([place]) 09/29/2015   Chronic cough 10/04/2014   B12 deficiency 05/10/2014   Anemia in chronic kidney disease 04/26/2014    CKD (chronic kidney disease) stage 5, GFR less than 15 ml/min (HCC) 04/26/2014   Secondary hyperparathyroidism of renal origin ([place]) 11/27/2013   Posterior subcapsular cataract, bilateral 10/18/2013   Diabetic neuropathy, painful ([place]) 07/13/2013   Diabetes mellitus, type II, insulin dependent ([place]), on CGM, with end organ damage: renal, neuropathy, gastroparesis, retinopathy 02/19/2011   Hypertension associated with diabetes ([place]) 02/19/2011   Gastroparesis due to DM ([place]) 02/19/2011    Past Surgical History:  Procedure Laterality Date   A/V FISTULAGRAM Right 03/03/2020   Procedure: Venogram;  Surgeon: Waynetta Sandy, MD;  Location: [place] [facility];  Service: Cardiovascular;  Laterality: Right;   ARTERY REPAIR Right 02/17/2018   Procedure: EXPLORATION OF RIGHT BRACHIAL ARTERY;  Surgeon: Conrad Centertown, MD;  Location: [place];  Service: Vascular;  Laterality: Right;   ARTERY REPAIR Right 02/17/2018   Procedure: BRACHIAL ARTERY EXPLORATION AND TRHOMBECTOMY;  Surgeon: Conrad Nottoway, MD;  Location: [place];  Service: Vascular;  Laterality: Right;   AV FISTULA PLACEMENT Left 05/09/2017   Procedure: INSERTION OF ARTERIOVENOUS (AV) GRAFT ARM (ARTEGRAFT);  Surgeon: Conrad Oaklawn-Sunview, MD;  Location: [place];  Service: Vascular;  Laterality: Left;   AV FISTULA PLACEMENT Right 02/17/2018   Procedure: INSERTION OF ARTERIOVENOUS (AV) GORE-TEX GRAFT ARM RIGHT UPPER ARM;  Surgeon: Conrad Huntley, MD;  Location: [place];  Service: Vascular;  Laterality: Right;   AV FISTULA PLACEMENT Left 10/10/2019   Procedure: INSERTION OF LEFT ARTERIOVENOUS (AV) ARTEGRAFT GRAFT ARM;  Surgeon: Waynetta Sandy, MD;  Location: [place];  Service: Vascular;  Laterality: Left;   AV FISTULA PLACEMENT Right 03/19/2020   Procedure: INSERTION OF ARTERIOVENOUS (AV) GORE-TEX GRAFT IN RIGHT ARM AND VIABAHN STENT IN RIGHT AXILLARY ARTERY;  Surgeon: Waynetta Sandy, MD;  Location: [place];  Service: Vascular;  Laterality:  Right;   [place] Left 08/31/2016   Procedure: BASILIC VEIN TRANSPOSITION FIRST STAGE;  Surgeon: Conrad Oppelo, MD;  Location: [place];  Service: Vascular;  Laterality: Left;   CENTRAL VENOUS CATHETER INSERTION Left 07/24/2018   Procedure: INSERTION CENTRAL LINE ADULT;  Surgeon: Conrad Iola, MD;  Location: [place];  Service: Vascular;  Laterality: Left;   EYE SURGERY     secondary to diabetic retinopathy    FOOT SURGERY Right    t"ook bone out- maybe hammer toe"   INSERTION OF DIALYSIS CATHETER Left 07/24/2018   Procedure: INSERTION OF TUNNELED DIALYSIS CATHETER;  Surgeon: Conrad Ashville, MD;  Location: [place];  Service: Vascular;  Laterality: Left;   IR FLUORO GUIDE CV LINE RIGHT  07/21/2018   IR FLUORO GUIDE CV LINE RIGHT  06/01/2019   IR FLUORO GUIDE CV LINE RIGHT  05/05/2020   IR REMOVAL TUN CV CATH W/O FL  01/23/2021   IR US GUIDE VASC ACCESS RIGHT  07/21/2018   IR US GUIDE VASC ACCESS RIGHT  06/01/2019   LIGATION ARTERIOVENOUS GORTEX GRAFT Right 03/20/2020   Procedure: LIGATION ARM ARTERIOVENOUS GORTEX GRAFT With Patch Angioplasty, Thrombectomy;  Surgeon: Waynetta Sandy, MD;  Location: [place];  Service: Vascular;  Laterality: Right;   LIGATION OF ARTERIOVENOUS  FISTULA Left 05/09/2017   Procedure: LIGATION OF ARTERIOVENOUS  FISTULA;  Surgeon: Conrad Moultrie, MD;  Location: [place];  Service: Vascular;  Laterality: Left;   LOOP RECORDER INSERTION N/A 12/05/2017   Procedure: LOOP RECORDER INSERTION;  Surgeon: Evans Lance, MD;  Location: [place] [facility];  Service: Cardiovascular;  Laterality: N/A;   MYOMECTOMY     REMOVAL OF GRAFT Right 02/17/2018   Procedure: REMOVAL OF RIGHT UPPER ARM ARTERIOVENOUS GRAFT;  Surgeon: Conrad Bostonia, MD;  Location: [place];  Service: Vascular;  Laterality: Right;   REVISION OF ARTERIOVENOUS GORETEX GRAFT Left 07/24/2018   Procedure: REDO ARTERIOVENOUS GORETEX GRAFT;  Surgeon: Conrad [place] Escobares, MD;  Location: [place];  Service: Vascular;  Laterality:  Left;   TEE WITHOUT CARDIOVERSION N/A 12/05/2017   Procedure: TRANSESOPHAGEAL ECHOCARDIOGRAM (TEE);  Surgeon: Sanda Klein, MD;  Location: [place];  Service: Cardiovascular;  Laterality: N/A;   UPPER EXTREMITY VENOGRAPHY Left 07/13/2018   Procedure: UPPER EXTREMITY VENOGRAPHY - Central & Left Arm;  Surgeon: Conrad [place], MD;  Location: [place] [facility];  Service: Cardiovascular;  Laterality: Left;   UPPER EXTREMITY VENOGRAPHY Bilateral 09/10/2019   Procedure: UPPER EXTREMITY VENOGRAPHY;  Surgeon: Waynetta Sandy, MD;  Location: [place] [facility];  Service: Cardiovascular;  Laterality: Bilateral;   UTERINE FIBROID SURGERY     VITRECTOMY Bilateral      OB History     Gravida  2   Para  1   Term      Preterm      AB  1   Living  1      SAB  1   IAB      Ectopic      Multiple      Live Births              Family History  Problem Relation Age of Onset   Colon polyps Mother    Diabetes Mother    Heart murmur Father        history essentially unknown   Hypertension Father    Diabetes Sister    Kidney failure Sister        kidney transplant   Diabetes Brother    Retinal degeneration Brother    Heart murmur Son    Stroke Paternal Grandmother    Diabetes Sister     Social History   Tobacco Use   Smoking status: Never   Smokeless tobacco: Never  Vaping Use   Vaping Use: Never used  Substance Use Topics   Alcohol use: No    Alcohol/week: 0.0 standard drinks   Drug use: No    Home Medications Prior to Admission medications   Medication Sig Start Date End Date Taking? Authorizing Provider  amLODipine (NORVASC) 5 MG tablet Take 1 tablet (5 mg total) by mouth daily. Patient taking differently: Take 10 mg by mouth daily. Reports MD increased to 72m 07/16/20   PVivi Barrack MD  aspirin-acetaminophen-caffeine ([facility]) 2323-277-7916MG tablet Take 1 tablet by mouth daily as needed for headache.    [provider]   cetirizine (ZYRTEC ALLERGY) 10 MG tablet Take 1 tablet (10 mg total) by mouth daily. Patient not taking: Reported on 07/10/2021 09/26/20   BCollene Gobble MD  clindamycin (CLINDAGEL) 1 % gel APPLY TO AFFECTED AREA TWICE A DAY 04/27/21   PVivi Barrack MD  Continuous Blood Gluc Sensor (FREESTYLE  LIBRE 14 DAY SENSOR) MISC 1 patch by Does not apply route every 14 (fourteen) days. 07/10/21   [name], MD  gabapentin (NEURONTIN) 300 MG capsule Take 1 capsule (300 mg total) by mouth daily as needed. Take after dialysis. Do not take more than three times weekly. 06/18/21   [name], MD  HYDROcodone bit-homatropine [facility]) 5-1.5 MG/5ML syrup Take 5 mLs by mouth every 8 (eight) hours as needed for cough. 07/10/21   [name], MD  insulin aspart (NOVOLOG FLEXPEN) 100 UNIT/ML FlexPen Inject 6 Units into the skin 3 (three) times daily with meals. Sliding scale 07/16/20   [name], MD  insulin degludec (TRESIBA FLEXTOUCH) 100 UNIT/ML SOPN FlexTouch Pen Inject 0.26 mLs (26 Units total) into the skin at bedtime. 12/26/19   Shamleffer, Melanie Crazier, MD  Insulin Pen Needle 29G X 5MM MISC 1 Device by Does not apply route 4 (four) times daily. 12/26/19   Shamleffer, Melanie Crazier, MD  lisinopril (ZESTRIL) 10 MG tablet Take 1 tablet (10 mg total) by mouth 3 (three) times a week. After dialysis. 01/19/21   [name], MD  lubiprostone (AMITIZA) 24 MCG capsule Take 1 capsule (24 mcg total) by mouth 2 (two) times daily with a meal. Patient taking differently: Take 24 mcg by mouth daily. 02/23/19   Love, Ivan Anchors, PA-C  methocarbamol (ROBAXIN) 500 MG tablet Take 1 tablet (500 mg total) by mouth every 8 (eight) hours as needed for muscle spasms. Patient not taking: Reported on 07/10/2021 06/12/21   Midge Minium, MD  midodrine (PROAMATINE) 5 MG tablet TAKE 1 TABLET (5 MG TOTAL) BY MOUTH 2 (TWO) TIMES DAILY WITH A MEAL. Patient taking differently: Take 5 mg by mouth daily as needed  (hypotension). 07/04/19   Briscoe Deutscher, DO  pregabalin (LYRICA) 75 MG capsule Take 1 capsule (75 mg total) by mouth daily. Patient not taking: Reported on 07/10/2021 03/31/21   [name], MD  promethazine (PHENERGAN) 25 MG tablet Take 1 tablet (25 mg total) by mouth every 8 (eight) hours as needed for nausea or vomiting. Patient not taking: Reported on 07/10/2021 03/31/21   [name], MD    Allergies    Ibuprofen, Dilaudid [hydromorphone hcl], and Tramadol  Review of Systems   Review of Systems  Constitutional:  Positive for fatigue.  HENT:  Positive for sore throat. Negative for trouble swallowing and voice change.   Respiratory:  Positive for cough (chronic x months).   Cardiovascular: Negative.   Gastrointestinal: Negative.   Genitourinary: Negative.   Musculoskeletal: Negative.   Skin: Negative.   Neurological:  Positive for weakness (Generalized). Negative for dizziness, tremors, light-headedness and headaches.  All other systems reviewed and are negative.  Physical Exam Updated Vital Signs BP (!) 162/118 (BP Location: Right Arm)   Pulse 76   Temp 99.1 F (37.3 C) (Oral)   Resp 20   Ht _0  (1.753 m)   Wt 98 kg   SpO2 100%   BMI 31.90 kg/m   Physical Exam Vitals and nursing [name] reviewed.  Constitutional:      General: Heather [name] is not in acute distress.    Appearance: Heather [name] is well-developed. Heather [name] is ill-appearing (chronically ill appearing). Heather [name] is not toxic-appearing or diaphoretic.  HENT:     Head: Atraumatic.     Jaw: There is normal jaw occlusion.     Mouth/Throat:     Mouth: Mucous membranes are moist.     Pharynx: Oropharynx is  clear.     Comments: Posterior oropharynx with some mild erythema.  No evidence of tonsillar edema or exudate. Eyes:     Pupils: Pupils are equal, round, and reactive to light.  Neck:     Comments: Full range of motion without difficulty.  No stridor.  No meningismus Cardiovascular:     Rate and Rhythm: Normal rate.     Pulses:  Normal pulses.     Heart sounds: Normal heart sounds.  Pulmonary:     Effort: Pulmonary effort is normal. No respiratory distress.     Breath sounds: Normal breath sounds.     Comments: Speaks in full sentences without difficulty Abdominal:     General: Bowel sounds are normal. There is no distension.     Palpations: Abdomen is soft.  Musculoskeletal:        General: Normal range of motion.     Cervical back: Normal range of motion and neck supple.     Comments: Right lower extremity amputation Fistula to left upper extremity with palpable thrill  Skin:    General: Skin is warm and dry.     Capillary Refill: Capillary refill takes less than 2 seconds.  Neurological:     General: No focal deficit present.     Mental Status: Heather [name] is alert and oriented to person, place, and time.  Psychiatric:        Mood and Affect: Mood normal.    ED Results / Procedures / Treatments   Labs (all labs ordered are listed, but only abnormal results are displayed) Labs Reviewed  CBC WITH DIFFERENTIAL/PLATELET - Abnormal; Notable for the following components:      Result Value   RBC 3.14 (*)    Hemoglobin 9.5 (*)    HCT 30.3 (*)    Eosinophils Absolute 0.7 (*)    All other components within normal limits  BASIC METABOLIC PANEL - Abnormal; Notable for the following components:   Chloride 96 (*)    Glucose, Bld 115 (*)    BUN 59 (*)    Creatinine, Ser 14.89 (*)    Calcium 8.5 (*)    GFR, Estimated 3 (*)    All other components within normal limits    EKG None  Radiology CT Soft Tissue Neck Wo Contrast  Result Date: 07/29/2021 CLINICAL DATA:  Epiglottitis or tonsillitis suspected. Cough and sore throat. EXAM: CT NECK WITHOUT CONTRAST TECHNIQUE: Multidetector CT imaging of the neck was performed following the standard protocol without intravenous contrast. COMPARISON:  None. FINDINGS: PHARYNX AND LARYNX: The nasopharynx, oropharynx and larynx are normal. Visible portions of the oral cavity,  tongue base and floor of mouth are normal. Normal epiglottis, vallecula and pyriform sinuses. The larynx is normal. No retropharyngeal abscess, effusion or lymphadenopathy. SALIVARY GLANDS: Normal parotid, submandibular and sublingual glands. THYROID: Normal. LYMPH NODES: No enlarged or abnormal density lymph nodes. VASCULAR: Major cervical vessels are patent. LIMITED INTRACRANIAL: Normal. VISUALIZED ORBITS: Normal. MASTOIDS AND VISUALIZED PARANASAL SINUSES: No fluid levels or advanced mucosal thickening. No mastoid effusion. SKELETON: No bony spinal canal stenosis. No lytic or blastic lesions. UPPER CHEST: Mild biapical tree-in-bud opacity. OTHER: None. IMPRESSION: 1. No acute abnormality of the neck. 2. Mild biapical tree-in-bud opacity, nonspecific but possibly indicating endobronchial infection. Electronically Signed   By: Ulyses Jarred M.D.   On: 07/29/2021 18:57   CT Chest Wo Contrast  Result Date: 07/29/2021 CLINICAL DATA:  Chest pain and shortness of breath. Sore throat for 3 weeks. EXAM: CT CHEST WITHOUT CONTRAST  TECHNIQUE: Multidetector CT imaging of the chest was performed following the standard protocol without IV contrast. COMPARISON:  Chest CT 11/05/2020 FINDINGS: Cardiovascular: The heart is normal in size. Stable appearing moderate-sized pericardial effusion. Stable age advanced atherosclerotic calcifications involving the aorta and coronary arteries. Mediastinum/Nodes: No mediastinal or hilar mass or lymphadenopathy. Small scattered lymph nodes appears stable. The esophagus is grossly normal. Lungs/Pleura: Patchy chronic appearing tree-in-bud opacities bilaterally with some scattered tiny calcifications. Findings likely due to chronic inflammation or chronic atypical infection such as MAC. No new or progressive findings. No worrisome pulmonary lesions. No focal pulmonary infiltrates. No pleural effusions or pleural nodules. Upper Abdomen: No significant upper abdominal findings. Left renal  calculi are noted. No upper abdominal adenopathy. Musculoskeletal: No breast masses, supraclavicular or axillary adenopathy. There is a stable loop recorder noted in the left chest wall. The bony thorax is intact. Remote healed left rib fractures are noted. IMPRESSION: 1. Stable moderate-sized pericardial effusion. 2. Stable age advanced atherosclerotic calcifications involving the aorta and coronary arteries. 3. Patchy chronic appearing tree-in-bud opacities bilaterally with some scattered tiny calcifications. Findings likely due to chronic inflammation or chronic atypical infection such as MAC. No new or progressive findings. 4. No mediastinal or hilar mass or adenopathy. 5. Aortic atherosclerosis. Aortic Atherosclerosis (ICD10-I70.0). Electronically Signed   By: Marijo Sanes M.D.   On: 07/29/2021 20:12   DG Chest Portable 1 View  Result Date: 07/29/2021 CLINICAL DATA:  Cough EXAM: PORTABLE CHEST 1 VIEW COMPARISON:  None. FINDINGS: The heart size and mediastinal contours are within normal limits. Both lungs are clear. Old healed left-sided rib fractures. IMPRESSION: No active disease. Electronically Signed   By: Ulyses Jarred M.D.   On: 07/29/2021 19:16    Procedures Procedures   Medications Ordered in ED Medications - No data to display  ED Course  I have reviewed the triage vital signs and the nursing notes.  Pertinent labs & imaging results that were available during my care of the patient were reviewed by me and considered in my medical decision making (see chart for details).  Here for evaluation of sore throat x3 weeks.  Heather [name] is afebrile, nonseptic however does appear chronically ill.  Seen in ED last week, sharp, COVID test negative.  Was given prophylactic antibiotics for pharyngitis.  Patient states symptoms are not improved.  Pain worse with swallowing.  Did not go to dialysis yesterday because Heather [name] felt poor.  Does endorse cough however has chronic cough.  No hemoptysis.  No chest pain,  shortness of breath, lower extremity edema.  Right lower extremity amputation.  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  Low suspicion for cardiac tamponade as patient's pericardial effusion has been seen on multiple imaging for extended period time, patient denies any chest pain, shortness of breath.  No hypoxia here in the emergency department.  Heather [name] is without tachycardia, tachypnea.  Does have some mildly elevated blood pressures however not taking her blood pressure medicine over the last 3 days  Unclear etiology of patient's sore throat.  Given this is been ongoing x3 weeks, would likely benefit from follow-up with ENT.  Encouraged compliance with dialysis tomorrow, follow-up with PCP and  Pulm whom Heather [name] was ref to by PCP for chronic cough.  At this time I have low suspicion for acute bacterial infectious process, PE, ACS, dissection, pneumothorax  CONSULT with Dr. Silas Flood with Pulm who recommends outpatient FU. Does not needs abx at this time. Will have patient also FU with ENT given sore throat x 3 weeks.  The patient has been appropriately medically screened and/or stabilized in the ED. I have low suspicion for any other emergent medical condition which would require further screening, evaluation or treatment in the ED or require inpatient management.  Patient is hemodynamically stable and in no acute distress.  Patient able to ambulate in department prior to ED.  Evaluation does not show acute pathology that would require ongoing or additional emergent interventions while in the emergency department or further inpatient treatment.  I  have discussed the diagnosis with the patient and answered all questions.  Pain is been managed while in the emergency department and patient has no further complaints prior to discharge.  Patient is comfortable with plan discussed in room and is stable for discharge at this time.  I have discussed strict return precautions for returning to the emergency department.  Patient was encouraged to follow-up with PCP/specialist refer to at discharge.     MDM Rules/Calculators/A&P                           BREI POCIASK was evaluated in Emergency Department on 07/29/2021 for the symptoms described in the history of present illness. Heather [name] was evaluated in the context of the global COVID-19 pandemic, which necessitated consideration that the patient might be at risk for infection with the SARS-CoV-2 virus that causes COVID-19. Institutional protocols and algorithms that pertain to the evaluation of patients at risk for COVID-19 are in a state of rapid change based on information released by regulatory bodies including the CDC and federal and state organizations. These policies and algorithms were followed during the patient's care in the ED.  Final Clinical Impression(s) / ED Diagnoses Final diagnoses:  Sore throat  Chronic cough  ESRD (end stage renal disease) [facility])    Rx / DC Orders ED Discharge Orders     None        Beck Cofer A, PA-C 07/29/21 2041    Lucrezia Starch, MD 08/01/21 [id number]

## 2021-07-29 NOTE — ED Notes (Signed)
Right radial artery puncture for venous labs.  +Allen's, direct pressure for 5 minutes.

## 2021-07-29 NOTE — ED Triage Notes (Signed)
Cont cough and sore throat x 3 weeks , seen at [facility] ed 7/24 for same , COvid, strep flu chest xray - neg

## 2021-08-10 ENCOUNTER — Encounter (HOSPITAL_COMMUNITY): Payer: Self-pay | Admitting: *Deleted

## 2021-08-20 ENCOUNTER — Other Ambulatory Visit: Payer: Self-pay

## 2021-08-20 ENCOUNTER — Ambulatory Visit (INDEPENDENT_AMBULATORY_CARE_PROVIDER_SITE_OTHER): Payer: Managed Care, Other (non HMO) | Admitting: Sports Medicine

## 2021-08-20 ENCOUNTER — Encounter: Payer: Self-pay | Admitting: Sports Medicine

## 2021-08-20 DIAGNOSIS — E1140 Type 2 diabetes mellitus with diabetic neuropathy, unspecified: Secondary | ICD-10-CM

## 2021-08-20 DIAGNOSIS — M79675 Pain in left toe(s): Secondary | ICD-10-CM | POA: Diagnosis not present

## 2021-08-20 DIAGNOSIS — Z89511 Acquired absence of right leg below knee: Secondary | ICD-10-CM

## 2021-08-20 DIAGNOSIS — B351 Tinea unguium: Secondary | ICD-10-CM

## 2021-08-20 DIAGNOSIS — N186 End stage renal disease: Secondary | ICD-10-CM

## 2021-08-20 DIAGNOSIS — L309 Dermatitis, unspecified: Secondary | ICD-10-CM | POA: Diagnosis not present

## 2021-08-20 MED ORDER — NYSTATIN-TRIAMCINOLONE 100000-0.1 UNIT/GM-% EX OINT: 1.0000 "application " | TOPICAL_OINTMENT | Freq: Two times a day (BID) | CUTANEOUS | 0 refills | Status: DC

## 2021-08-20 NOTE — Progress Notes (Signed)
Subjective: Heather [name] is a 50 y.o. female patient with history of diabetes who presents to office today complaining of long,mildly painful thick toenails on the left.  Patient also reports that she has a itchy rash to the left lateral leg that has been present for a year stayed about the same has not tried any treatment.  Patient is an amputee and reports that her right lower leg was amputated in March of this year secondary to Charcot.  Patient is diabetic with blood sugar 141 A1c of 7 and last visit to PCP Dr. Jerline Pain 1 week ago.  Patient denies any other pedal complaints at this time.  Patient Active Problem List   Diagnosis Date Noted   S/P BKA (below knee amputation), right ([place]) 03/31/2021   Impaired mobility and ADLs 03/25/2021   CVA (cerebral vascular accident) ([place]) 03/04/2021   Pulmonary embolus ([place]) 03/04/2021   Charcot ankle, right 02/23/2021   Hx of cardiac arrest 11/12/2020   Abnormal CT of the chest 09/26/2020   Allergy, unspecified, initial encounter 09/22/2020   Anaphylactic shock, unspecified, initial encounter 09/22/2020   Fluid overload, unspecified 06/10/2020   Other mechanical complication of other cardiac and vascular devices and implants, initial encounter ([place]) 05/10/2020   Positive ANA (antinuclear antibody) 04/16/2020   ESRD on dialysis [facility])    Hemiplegia and hemiparesis following cerebral infarction affecting left non-dominant side ([place]) 12/29/2018   IBS (irritable bowel syndrome) 08/06/2017   Dyslipidemia associated with type 2 diabetes mellitus ([place]) 06/25/2016   Proliferative diabetic retinopathy ([place]) 09/29/2015   Chronic cough 10/04/2014   B12 deficiency 05/10/2014   Anemia in chronic kidney disease 04/26/2014   CKD (chronic kidney disease) stage 5, GFR less than 15 ml/min (HCC) 04/26/2014   Secondary hyperparathyroidism of renal origin ([place]) 11/27/2013   Posterior subcapsular cataract, bilateral 10/18/2013   Diabetic neuropathy, painful ([place])  07/13/2013   Diabetes mellitus, type II, insulin dependent ([place]), on CGM, with end organ damage: renal, neuropathy, gastroparesis, retinopathy 02/19/2011   Hypertension associated with diabetes ([place]) 02/19/2011   Gastroparesis due to DM ([place]) 02/19/2011   Current Outpatient Medications on File Prior to Visit  Medication Sig Dispense Refill   amLODipine (NORVASC) 5 MG tablet Take 1 tablet (5 mg total) by mouth daily. (Patient taking differently: Take 10 mg by mouth daily. Reports MD increased to 10mg ) 90 tablet 3   aspirin-acetaminophen-caffeine (EXCEDRIN MIGRAINE) 250-250-65 MG tablet Take 1 tablet by mouth daily as needed for headache.     benzonatate (TESSALON) 100 MG capsule Take 1 capsule (100 mg total) by mouth every 8 (eight) hours. 21 capsule 0   cetirizine (ZYRTEC ALLERGY) 10 MG tablet Take 1 tablet (10 mg total) by mouth daily. (Patient not taking: Reported on 07/10/2021) 30 tablet 1   clindamycin (CLINDAGEL) 1 % gel APPLY TO AFFECTED AREA TWICE A DAY 30 g 0   Continuous Blood Gluc Sensor (FREESTYLE LIBRE 14 DAY SENSOR) MISC 1 patch by Does not apply route every 14 (fourteen) days. 6 each 5   gabapentin (NEURONTIN) 300 MG capsule Take 1 capsule (300 mg total) by mouth daily as needed. Take after dialysis. Do not take more than three times weekly. 90 capsule 3   insulin aspart (NOVOLOG FLEXPEN) 100 UNIT/ML FlexPen Inject 6 Units into the skin 3 (three) times daily with meals. Sliding scale     insulin degludec (TRESIBA FLEXTOUCH) 100 UNIT/ML SOPN FlexTouch Pen Inject 0.26 mLs (26 Units total) into the skin at bedtime. 24 mL 3  Insulin Pen Needle 29G X 5MM MISC 1 Device by Does not apply route 4 (four) times daily. 400 each 3   lubiprostone (AMITIZA) 24 MCG capsule Take 1 capsule (24 mcg total) by mouth 2 (two) times daily with a meal. (Patient taking differently: Take 24 mcg by mouth daily.)     methocarbamol (ROBAXIN) 500 MG tablet Take 1 tablet (500 mg total) by mouth every 8 (eight)  hours as needed for muscle spasms. (Patient not taking: Reported on 07/10/2021) 30 tablet 0   midodrine (PROAMATINE) 5 MG tablet TAKE 1 TABLET (5 MG TOTAL) BY MOUTH 2 (TWO) TIMES DAILY WITH A MEAL. (Patient taking differently: Take 5 mg by mouth daily as needed (hypotension).) 180 tablet 0   promethazine (PHENERGAN) 25 MG tablet Take 1 tablet (25 mg total) by mouth every 8 (eight) hours as needed for nausea or vomiting. (Patient not taking: Reported on 07/10/2021) 20 tablet 0   No current facility-administered medications on file prior to visit.   Allergies  Allergen Reactions   Ibuprofen Other (See Comments)    CKD stage 3. Should avoid.   Dilaudid [Hydromorphone Hcl] Itching and Other (See Comments)    Can take with Benadryl.   Iodinated Diagnostic Agents Rash   Tramadol Itching    Recent Results (from the past 2160 hour(s))  Resp Panel by RT-PCR (Flu A&B, Covid) Nasopharyngeal Swab     Status: None   Collection Time: 07/20/21  2:55 AM   Specimen: Nasopharyngeal Swab; Nasopharyngeal(NP) swabs in vial transport medium  Result Value Ref Range   SARS Coronavirus 2 by RT PCR NEGATIVE NEGATIVE    Comment: (NOTE) SARS-CoV-2 target nucleic acids are NOT DETECTED.  The SARS-CoV-2 RNA is generally detectable in upper respiratory specimens during the acute phase of infection. The lowest concentration of SARS-CoV-2 viral copies this assay can detect is 138 copies/mL. A negative result does not preclude SARS-Cov-2 infection and should not be used as the sole basis for treatment or other patient management decisions. A negative result may occur with  improper specimen collection/handling, submission of specimen other than nasopharyngeal swab, presence of viral mutation(s) within the areas targeted by this assay, and inadequate number of viral copies(<138 copies/mL). A negative result must be combined with clinical observations, patient history, and epidemiological information. The expected  result is Negative.  Fact Sheet for Patients:  EntrepreneurPulse.com.au  Fact Sheet for Healthcare Providers:  IncredibleEmployment.be  This test is no t yet approved or cleared by the [place] FDA and  has been authorized for detection and/or diagnosis of SARS-CoV-2 by FDA under an Emergency Use Authorization (EUA). This EUA will remain  in effect (meaning this test can be used) for the duration of the COVID-19 declaration under Section 564(b)(1) of the Act, 21 U.S.C.section 360bbb-3(b)(1), unless the authorization is terminated  or revoked sooner.       Influenza A by PCR NEGATIVE NEGATIVE   Influenza B by PCR NEGATIVE NEGATIVE    Comment: (NOTE) The Xpert Xpress SARS-CoV-2/FLU/RSV plus assay is intended as an aid in the diagnosis of influenza from Nasopharyngeal swab specimens and should not be used as a sole basis for treatment. Nasal washings and aspirates are unacceptable for Xpert Xpress SARS-CoV-2/FLU/RSV testing.  Fact Sheet for Patients: EntrepreneurPulse.com.au  Fact Sheet for Healthcare Providers: IncredibleEmployment.be  This test is not yet approved or cleared by the [place] FDA and has been authorized for detection and/or diagnosis of SARS-CoV-2 by FDA under an Emergency Use Authorization (EUA). This EUA  will remain in effect (meaning this test can be used) for the duration of the COVID-19 declaration under Section 564(b)(1) of the Act, 21 U.S.C. section 360bbb-3(b)(1), unless the authorization is terminated or revoked.  Performed at [facility], [place] [address] [place], [place] [address]   Basic metabolic panel     Status: Abnormal   Collection Time: 07/20/21  3:17 AM  Result Value Ref Range   Sodium 134 (L) 135 - 145 mmol/L   Potassium 4.7 3.5 - 5.1 mmol/L   Chloride 94 (L) 98 - 111 mmol/L   CO2 28 22 - 32 mmol/L   Glucose, Bld 250 (H) 70 - 99 mg/dL     Comment: Glucose reference range applies only to samples taken after fasting for at least 8 hours.   BUN 37 (H) 6 - 20 mg/dL   Creatinine, Ser 9.94 (H) 0.44 - 1.00 mg/dL   Calcium 9.1 8.9 - 10.3 mg/dL   GFR, Estimated 4 (L) >60 mL/min    Comment: (NOTE) Calculated using the CKD-EPI Creatinine Equation (2021)    Anion gap 12 5 - 15    Comment: Performed at [facility], [place] [address], [place], [place] [address]  Group A Strep by PCR     Status: None   Collection Time: 07/20/21  7:45 AM   Specimen: Throat; Sterile Swab  Result Value Ref Range   Group A Strep by PCR NOT DETECTED NOT DETECTED    Comment: Performed at [facility], [place] [address], [place],  [id number]  CBC with Differential     Status: Abnormal   Collection Time: 07/29/21  5:05 PM  Result Value Ref Range   WBC 8.0 4.0 - 10.5 K/uL   RBC 3.14 (L) 3.87 - 5.11 MIL/uL   Hemoglobin 9.5 (L) 12.0 - 15.0 g/dL   HCT 30.3 (L) 36.0 - 46.0 %   MCV 96.5 80.0 - 100.0 fL   MCH 30.3 26.0 - 34.0 pg   MCHC 31.4 30.0 - 36.0 g/dL   RDW 14.6 11.5 - 15.5 %   Platelets 269 150 - 400 K/uL   nRBC 0.0 0.0 - 0.2 %   Neutrophils Relative % 68 %   Neutro Abs 5.5 1.7 - 7.7 K/uL   Lymphocytes Relative 14 %   Lymphs Abs 1.1 0.7 - 4.0 K/uL   Monocytes Relative 8 %   Monocytes Absolute 0.6 0.1 - 1.0 K/uL   Eosinophils Relative 9 %   Eosinophils Absolute 0.7 (H) 0.0 - 0.5 K/uL   Basophils Relative 0 %   Basophils Absolute 0.0 0.0 - 0.1 K/uL   Immature Granulocytes 1 %   Abs Immature Granulocytes 0.06 0.00 - 0.07 K/uL    Comment: Performed at [facility], [address] [place], [place] [address]  Basic metabolic panel     Status: Abnormal   Collection Time: 07/29/21  5:05 PM  Result Value Ref Range   Sodium 136 135 - 145 mmol/L   Potassium 4.3 3.5 - 5.1 mmol/L   Chloride 96 (L) 98 - 111 mmol/L   CO2 25 22 - 32 mmol/L   Glucose, Bld 115 (H) 70 - 99 mg/dL    Comment: Glucose  reference range applies only to samples taken after fasting for at least 8 hours.   BUN 59 (H) 6 - 20 mg/dL   Creatinine, Ser 14.89 (H) 0.44 - 1.00 mg/dL   Calcium 8.5 (L) 8.9 - 10.3 mg/dL  GFR, Estimated 3 (L) >60 mL/min    Comment: (NOTE) Calculated using the CKD-EPI Creatinine Equation (2021)    Anion gap 15 5 - 15    Comment: Performed at [facility], [place] [address] [place], [place] [address]    Objective: General: Patient is awake, alert, and oriented x 3 and in no acute distress.  Integument: Skin is warm, dry and supple on the left.  Nails are tender, long, thickened and  dystrophic with subungual debris, consistent with onychomycosis, 1-5 on left there are multiple hyperpigmented lesions to the left lower extremity with excoriation from patient itching/scratching.  Remaining integument unremarkable.  Vasculature:  Dorsalis Pedis pulse 1/4 on left.  Posterior Tibial pulse  1/4 on left. Capillary fill time <ess than 5 seconds on the left.  Neurology: The patient has absent protective sensation with Semmes Weinstein monofilament on the left.  Musculoskeletal: Status post right below-knee amputation.  Assessment and Plan: Problem List Items Addressed This Visit       Endocrine   Diabetic neuropathy, painful ([place])     Other   S/P BKA (below knee amputation), right ([place])   Other Visit Diagnoses     Dermatitis    -  Primary   Pain due to onychomycosis of toenail of left foot       Relevant Medications   nystatin-triamcinolone ointment (MYCOLOG)   ESRD (end stage renal disease) ([place])           -Examined patient. -Discussed and educated patient on diabetic foot care, especially with  regards to the vascular, neurological and musculoskeletal systems.  -Stressed the importance of good glycemic control and the detriment of not  controlling glucose levels in relation to the foot. -Mechanically debrided all nails 1-5 on left using sterile nail nipper and  filed with dremel without incident  -Rx Mycolog for leg and foot on left -Answered all patient questions -Patient to return  in 6 weeks for med check for dermatitis  -Patient advised to call the office if any problems or questions arise in the meantime.  Landis Martins, DPM

## 2021-08-21 ENCOUNTER — Ambulatory Visit (INDEPENDENT_AMBULATORY_CARE_PROVIDER_SITE_OTHER): Payer: Managed Care, Other (non HMO) | Admitting: Emergency Medicine

## 2021-08-21 ENCOUNTER — Encounter: Payer: Self-pay | Admitting: Emergency Medicine

## 2021-08-21 ENCOUNTER — Telehealth: Payer: Self-pay | Admitting: Emergency Medicine

## 2021-08-21 VITALS — BP 138/86 | HR 89 | Temp 99.0°F

## 2021-08-21 DIAGNOSIS — R9389 Abnormal findings on diagnostic imaging of other specified body structures: Secondary | ICD-10-CM | POA: Diagnosis not present

## 2021-08-21 DIAGNOSIS — R053 Chronic cough: Secondary | ICD-10-CM

## 2021-08-21 MED ORDER — PANTOPRAZOLE SODIUM 40 MG PO TBEC: 40.0000 mg | DELAYED_RELEASE_TABLET | Freq: Every day | ORAL | 3 refills | Status: DC

## 2021-08-21 NOTE — Patient Instructions (Signed)
We will arrange for pulmonary function testing We will arrange for bronchoscopy as an outpatient to perform respiratory cultures.  We will try to get this done at [facility] next month.  You will need a designated driver. Please try starting pantoprazole 40 mg once daily.  Take this medication 1 hour around food.  Continue until next visit to see if it helps your coughing. Follow with Dr. Lamonte Sakai next available with full pulmonary function testing on the same day.

## 2021-08-21 NOTE — Assessment & Plan Note (Signed)
Persistent now for over a year and a half.  Question whether there may be a GERD component based on her recent laryngoscopy results.  [name] off an ACE inhibitor (stopped over a year ago).  She has micronodular tree-in-bud infiltrates in the bilateral upper lobes that suggestive of opportunistic infection, MAIC.  I believe these need to be further evaluated. Plan to arrange for pulmonary function testing to evaluate for obstructive lung disease.  I will empirically start GERD therapy to see if she gets benefit.  We will arrange for bronchoscopy with BAL and culture data.  If we do isolate Mycobacterium then we will have to talk about initiating treatment.  I will try to get the bronchoscopy done at [facility] next month.  We will need to be on a Monday Wednesday Friday.

## 2021-08-21 NOTE — Telephone Encounter (Signed)
Dr Lamonte Sakai - You did have on the order your preference was 9/21 with alternate date of 9/14.  Scheduler originally said nothing for either date but then did give me 9/14.  I just called them back when I got this message to verify nothing for 9/21 and was told they can do 9/21 but there would be no MAC in that room.  Do you still want me to change it to 9/21 or is there another date you want me to check?

## 2021-08-21 NOTE — Progress Notes (Signed)
 Subjective:    Patient ID: Heather [name], female    DOB: 09/02/1971, 49 y.o.   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  She has been seen here remotely for upper airway cough.  In May 2021 she had an ICU admission following a cardiopulmonary arrest when she was undergoing elective dialysis catheter placement in IR.  [name] referred today for evaluation of abnormal CT scan of the chest. She has been experiencing chronic persistent cough since May hospitalization, non productive.  [name] having some nasal drainage, hasn't noticed any GERD This prompted her CT done on 07/31/2020 which I have reviewed, shows no significant adenopathy, some nonspecific scattered clustered centrilobular groundglass nodular opacities in the bilateral upper lobes.  ROV 08/21/21 --follow-up visit 49-year-old never smoker with hypertension and diabetes, end-stage renal disease on HD.  Also with a history of VTE/PE (off anticoagulation).  She had a critical care hospitalization 04/2020 with a cardiopulmonary arrest.  Have seen her for chronic cough as well as an abnormal CT scan of the chest.  She had some nonspecific scattered clustered centrilobular groundglass opacities in the upper lobes.  Minimal change on the scan in November 2021. [name] formerly on lisinopril, off for a year. She saw ENT recently, no anatomical abnormality, ? GERD findings.   Repeat CT scan of the chest 07/29/2021 reviewed by me, shows stable moderate pericardial effusion, patchy chronic appearing tree-in-bud opacities bilaterally, some scattered calcifications, presumed due to chronic inflammatory process, consider atypical infection such as MAIC.  No evidence of progression   Review of Systems As per HPI  Past Medical History:  Diagnosis  Date   Antiplatelet or antithrombotic long-term use: Plavix 06/30/2017   B12 deficiency 05/10/2014   Blood transfusion without reported diagnosis    Chronic constipation    CVA (cerebral vascular accident) (HCC), nonhemorrhagic, inferior right cerebellum 12/03/2017   left side weakness in leg   Cyst of left ovary 01/12/2018   Diabetic neuropathy, painful (HCC), on low dose Gabapentin 07/13/2013   DM (diabetes mellitus), type 2, uncontrolled, with renal complications (HCC) 10/12/2016   DM2 (diabetes mellitus, type 2) (HCC)    Dysfunction of left eustachian tube, with pusatile tinnitus 11/24/2017   Dyslipidemia associated with type 2 diabetes mellitus (HCC) 06/25/2016   ESRD (end stage renal disease) on dialysis (HCC)    On hemodialysis in July 2019 via TDC then switched to CCPD in Nov 2019.    ESRD with anemia (HCC)    Fibromyalgia    Gastroparesis due to DM    GERD (gastroesophageal reflux disease)    Headache    Hypertension associated with diabetes (HCC) 02/19/2011   IBS (irritable bowel syndrome)    Left-sided weakness 12/10/2017   .   Leiomyoma of uterus    Pancreatitis    PE (pulmonary thromboembolism) (HCC) 11/04/2019   Pneumonia    Proliferative diabetic retinopathy (HCC) 09/29/2015   Pronation deformity of both feet 06/14/2014   Reactive depression 12/10/2017   .   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HCC) 02/17/2018   Thromboembolism (HCC) 04/05/2018   Upper airway cough syndrome, with recs to stay off ACE and take Pepcid q hs 11/15/2016     Family History  Problem Relation Age   of Onset   Colon polyps Mother    Diabetes Mother    Heart murmur Father        history essentially unknown   Hypertension Father    Diabetes Sister    Kidney failure Sister        kidney transplant   Diabetes Brother    Retinal degeneration Brother    Heart murmur Son    Stroke Paternal  Grandmother    Diabetes Sister      Social History   Socioeconomic History   Marital status: Married    Spouse name: Not on file   Number of children: 1   Years of education: college   Highest education level: Not on file  Occupational History   Occupation: customer service rep    Employer: [employer]  Tobacco Use   Smoking status: Never   Smokeless tobacco: Never  Vaping Use   Vaping Use: Never used  Substance and Sexual Activity   Alcohol use: No    Alcohol/week: 0.0 standard drinks   Drug use: No   Sexual activity: Not Currently    Partners: Male    Birth control/protection: I.U.D.  Other Topics Concern   Not on file  Social History Narrative   Patient lives with fiance. She has 1 grown son. Works full-time, she prints files for attorney.   Caffeine Use: 2 cups daily; sodas occasionally   She has 7 grandchildren   Social Determinants of Health   Financial Resource Strain: Not on file  Food Insecurity: Not on file  Transportation Needs: Not on file  Physical Activity: Not on file  Stress: Not on file  Social Connections: Not on file  Intimate Partner Violence: Not on file     Allergies  Allergen Reactions   Ibuprofen Other (See Comments)    CKD stage 3. Should avoid.   Dilaudid [Hydromorphone Hcl] Itching and Other (See Comments)    Can take with Benadryl.   Iodinated Diagnostic Agents Rash   Tramadol Itching     Outpatient Medications Prior to Visit  Medication Sig Dispense Refill   amLODipine (NORVASC) 5 MG tablet Take 1 tablet (5 mg total) by mouth daily. (Patient taking differently: Take 10 mg by mouth daily. Reports MD increased to 10mg) 90 tablet 3   aspirin-acetaminophen-caffeine (EXCEDRIN MIGRAINE) 250-250-65 MG tablet Take 1 tablet by mouth daily as needed for headache.     benzonatate (TESSALON) 100 MG capsule Take 1 capsule (100 mg total) by mouth every 8 (eight) hours. 21 capsule 0   cetirizine (ZYRTEC ALLERGY) 10 MG tablet Take 1  tablet (10 mg total) by mouth daily. 30 tablet 1   Continuous Blood Gluc Sensor (FREESTYLE LIBRE 14 DAY SENSOR) MISC 1 patch by Does not apply route every 14 (fourteen) days. 6 each 5   gabapentin (NEURONTIN) 300 MG capsule Take 1 capsule (300 mg total) by mouth daily as needed. Take after dialysis. Do not take more than three times weekly. 90 capsule 3   insulin aspart (NOVOLOG FLEXPEN) 100 UNIT/ML FlexPen Inject 6 Units into the skin 3 (three) times daily with meals. Sliding scale     insulin degludec (TRESIBA FLEXTOUCH) 100 UNIT/ML SOPN FlexTouch Pen Inject 0.26 mLs (26 Units total) into the skin at bedtime. 24 mL 3   Insulin Pen Needle 29G X 5MM MISC 1 Device by Does not apply route 4 (four) times daily. 400 each 3   lubiprostone (AMITIZA) 24 MCG capsule Take 1 capsule (24 mcg total)   by mouth 2 (two) times daily with a meal. (Patient taking differently: Take 24 mcg by mouth daily.)     methocarbamol (ROBAXIN) 500 MG tablet Take 1 tablet (500 mg total) by mouth every 8 (eight) hours as needed for muscle spasms. 30 tablet 0   midodrine (PROAMATINE) 5 MG tablet TAKE 1 TABLET (5 MG TOTAL) BY MOUTH 2 (TWO) TIMES DAILY WITH A MEAL. (Patient taking differently: Take 5 mg by mouth daily as needed (hypotension).) 180 tablet 0   nystatin-triamcinolone ointment (MYCOLOG) Apply 1 application topically 2 (two) times daily. 30 g 0   promethazine (PHENERGAN) 25 MG tablet Take 1 tablet (25 mg total) by mouth every 8 (eight) hours as needed for nausea or vomiting. 20 tablet 0   clindamycin (CLINDAGEL) 1 % gel APPLY TO AFFECTED AREA TWICE A DAY (Patient not taking: Reported on 08/21/2021) 30 g 0   No facility-administered medications prior to visit.        Objective:   Physical Exam Vitals:   08/21/21 1101  BP: 138/86  Pulse: 89  Temp: 99 F (37.2 C)  TempSrc: Oral  SpO2: 94%   Gen: Pleasant, overwt woman, in no distress,  normal affect  ENT: No lesions,  mouth clear,  oropharynx clear, no postnasal  drip  Neck: No JVD, no stridor  Lungs: No use of accessory muscles, no crackles or wheezing on normal respiration, no wheeze on forced expiration  Cardiovascular: RRR, heart sounds normal, no murmur or gallops, no peripheral edema  Musculoskeletal: No deformities, no cyanosis or clubbing  Neuro: alert, awake, non focal  Skin: Warm, no lesions or rash      Assessment & Plan:  Chronic cough Persistent now for over a year and a half.  Question whether there may be a GERD component based on her recent laryngoscopy results.  [name] off an ACE inhibitor (stopped over a year ago).  She has micronodular tree-in-bud infiltrates in the bilateral upper lobes that suggestive of opportunistic infection, MAIC.  I believe these need to be further evaluated. Plan to arrange for pulmonary function testing to evaluate for obstructive lung disease.  I will empirically start GERD therapy to see if she gets benefit.  We will arrange for bronchoscopy with BAL and culture data.  If we do isolate Mycobacterium then we will have to talk about initiating treatment.  I will try to get the bronchoscopy done at [facility] next month.  We will need to be on a Monday Wednesday Friday.  Time spent 31 minutes   Mizuki Hoel, MD, PhD 08/21/2021, 11:39 AM [facility] [phone] or if no answer [phone]  

## 2021-08-21 NOTE — Addendum Note (Signed)
Addended by: Gavin Potters R on: 08/21/2021 12:23 PM   Modules accepted: Orders

## 2021-08-21 NOTE — Telephone Encounter (Signed)
I scheduled Bronch at [facility] on 9/14 at 9:45.  Pt will go for covid test on 9/12.  Gave appt info to pt.

## 2021-08-21 NOTE — Telephone Encounter (Signed)
Judeen Hammans - I apologize, I looked at my schedule wrong. I need to change her to Wednesday 9/21

## 2021-08-21 NOTE — H&P (View-Only) (Signed)
Subjective:    Patient ID: Heather [name], female    DOB: 03-22-1971, 50 y.o.   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  She has been seen here remotely for upper airway cough.  In May 2021 she had an ICU admission following a cardiopulmonary arrest when she was undergoing elective dialysis catheter placement in IR.  [name] referred today for evaluation of abnormal CT scan of the chest. She has been experiencing chronic persistent cough since May hospitalization, non productive.  [name] having some nasal drainage, hasn't noticed any GERD This prompted her CT done on 07/31/2020 which I have reviewed, shows no significant adenopathy, some nonspecific scattered clustered centrilobular groundglass nodular opacities in the bilateral upper lobes.  ROV 08/21/21 --follow-up visit 50 year old never smoker with hypertension and diabetes, end-stage renal disease on HD.  Also with a history of VTE/PE (off anticoagulation).  She had a critical care hospitalization 04/2020 with a cardiopulmonary arrest.  Have seen her for chronic cough as well as an abnormal CT scan of the chest.  She had some nonspecific scattered clustered centrilobular groundglass opacities in the upper lobes.  Minimal change on the scan in November 2021. [name] formerly on lisinopril, off for a year. She saw ENT recently, no anatomical abnormality, ? GERD findings.   Repeat CT scan of the chest 07/29/2021 reviewed by me, shows stable moderate pericardial effusion, patchy chronic appearing tree-in-bud opacities bilaterally, some scattered calcifications, presumed due to chronic inflammatory process, consider atypical infection such as MAIC.  No evidence of progression   Review of Systems As per HPI  Past Medical History:  Diagnosis  Date   Antiplatelet or antithrombotic long-term use: Plavix 06/30/2017   B12 deficiency 05/10/2014   Blood transfusion without reported diagnosis    Chronic constipation    CVA (cerebral vascular accident) ([place]), nonhemorrhagic, inferior right cerebellum 12/03/2017   left side weakness in leg   Cyst of left ovary 01/12/2018   Diabetic neuropathy, painful ([place]), on low dose Gabapentin 07/13/2013   DM (diabetes mellitus), type 2, uncontrolled, with renal complications ([place]) 40/09/2724   DM2 (diabetes mellitus, type 2) ([place])    Dysfunction of left eustachian tube, with pusatile tinnitus 11/24/2017   Dyslipidemia associated with type 2 diabetes mellitus ([place]) 06/25/2016   ESRD (end stage renal disease) on dialysis [facility])    On hemodialysis in July 2019 via [facility] then switched to [facility] in Nov 2019.    ESRD with anemia ([facility])    Fibromyalgia    Gastroparesis due to DM    GERD (gastroesophageal reflux disease)    Headache    Hypertension associated with diabetes ([place]) 02/19/2011   IBS (irritable bowel syndrome)    Left-sided weakness 12/10/2017   .   Leiomyoma of uterus    Pancreatitis    PE (pulmonary thromboembolism) ([place]) 11/04/2019   Pneumonia    Proliferative diabetic retinopathy ([place]) 09/29/2015   Pronation deformity of both feet 06/14/2014   Reactive depression 12/10/2017   .   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[place]) 02/17/2018   Thromboembolism ([place]) 04/05/2018   Upper airway cough syndrome, with recs to stay off ACE and take Pepcid q hs 11/15/2016     Family History  Problem Relation Age  of Onset   Colon polyps Mother    Diabetes Mother    Heart murmur Father        history essentially unknown   Hypertension Father    Diabetes Sister    Kidney failure Sister        kidney transplant   Diabetes Brother    Retinal degeneration Brother    Heart murmur Son    Stroke Paternal  Grandmother    Diabetes Sister      Social History   Socioeconomic History   Marital status: Married    Spouse name: Not on file   Number of children: 1   Years of education: college   Highest education level: Not on file  Occupational History   Occupation: customer service rep    Employer: [employer]  Tobacco Use   Smoking status: Never   Smokeless tobacco: Never  Vaping Use   Vaping Use: Never used  Substance and Sexual Activity   Alcohol use: No    Alcohol/week: 0.0 standard drinks   Drug use: No   Sexual activity: Not Currently    Partners: Male    Birth control/protection: I.U.D.  Other Topics Concern   Not on file  Social History Narrative   Patient lives with fiance. She has 1 grown son. Works full-time, she Paramedic for attorney.   Caffeine Use: 2 cups daily; sodas occasionally   She has 7 grandchildren   Social Determinants of Radio broadcast assistant Strain: Not on file  Food Insecurity: Not on file  Transportation Needs: Not on file  Physical Activity: Not on file  Stress: Not on file  Social Connections: Not on file  Intimate Partner Violence: Not on file     Allergies  Allergen Reactions   Ibuprofen Other (See Comments)    CKD stage 3. Should avoid.   Dilaudid [Hydromorphone Hcl] Itching and Other (See Comments)    Can take with Benadryl.   Iodinated Diagnostic Agents Rash   Tramadol Itching     Outpatient Medications Prior to Visit  Medication Sig Dispense Refill   amLODipine (NORVASC) 5 MG tablet Take 1 tablet (5 mg total) by mouth daily. (Patient taking differently: Take 10 mg by mouth daily. Reports MD increased to 31m) 90 tablet 3   aspirin-acetaminophen-caffeine (EXCEDRIN MIGRAINE) 250-250-65 MG tablet Take 1 tablet by mouth daily as needed for headache.     benzonatate (TESSALON) 100 MG capsule Take 1 capsule (100 mg total) by mouth every 8 (eight) hours. 21 capsule 0   cetirizine (ZYRTEC ALLERGY) 10 MG tablet Take 1  tablet (10 mg total) by mouth daily. 30 tablet 1   Continuous Blood Gluc Sensor (FREESTYLE LIBRE 14 DAY SENSOR) MISC 1 patch by Does not apply route every 14 (fourteen) days. 6 each 5   gabapentin (NEURONTIN) 300 MG capsule Take 1 capsule (300 mg total) by mouth daily as needed. Take after dialysis. Do not take more than three times weekly. 90 capsule 3   insulin aspart (NOVOLOG FLEXPEN) 100 UNIT/ML FlexPen Inject 6 Units into the skin 3 (three) times daily with meals. Sliding scale     insulin degludec (TRESIBA FLEXTOUCH) 100 UNIT/ML SOPN FlexTouch Pen Inject 0.26 mLs (26 Units total) into the skin at bedtime. 24 mL 3   Insulin Pen Needle 29G X 5MM MISC 1 Device by Does not apply route 4 (four) times daily. 400 each 3   lubiprostone (AMITIZA) 24 MCG capsule Take 1 capsule (24 mcg total)  by mouth 2 (two) times daily with a meal. (Patient taking differently: Take 24 mcg by mouth daily.)     methocarbamol (ROBAXIN) 500 MG tablet Take 1 tablet (500 mg total) by mouth every 8 (eight) hours as needed for muscle spasms. 30 tablet 0   midodrine (PROAMATINE) 5 MG tablet TAKE 1 TABLET (5 MG TOTAL) BY MOUTH 2 (TWO) TIMES DAILY WITH A MEAL. (Patient taking differently: Take 5 mg by mouth daily as needed (hypotension).) 180 tablet 0   nystatin-triamcinolone ointment (MYCOLOG) Apply 1 application topically 2 (two) times daily. 30 g 0   promethazine (PHENERGAN) 25 MG tablet Take 1 tablet (25 mg total) by mouth every 8 (eight) hours as needed for nausea or vomiting. 20 tablet 0   clindamycin (CLINDAGEL) 1 % gel APPLY TO AFFECTED AREA TWICE A DAY (Patient not taking: Reported on 08/21/2021) 30 g 0   No facility-administered medications prior to visit.        Objective:   Physical Exam Vitals:   08/21/21 1101  BP: 138/86  Pulse: 89  Temp: 99 F (37.2 C)  TempSrc: Oral  SpO2: 94%   Gen: Pleasant, overwt woman, in no distress,  normal affect  ENT: No lesions,  mouth clear,  oropharynx clear, no postnasal  drip  Neck: No JVD, no stridor  Lungs: No use of accessory muscles, no crackles or wheezing on normal respiration, no wheeze on forced expiration  Cardiovascular: RRR, heart sounds normal, no murmur or gallops, no peripheral edema  Musculoskeletal: No deformities, no cyanosis or clubbing  Neuro: alert, awake, non focal  Skin: Warm, no lesions or rash      Assessment & Plan:  Chronic cough Persistent now for over a year and a half.  Question whether there may be a GERD component based on her recent laryngoscopy results.  [name] off an ACE inhibitor (stopped over a year ago).  She has micronodular tree-in-bud infiltrates in the bilateral upper lobes that suggestive of opportunistic infection, MAIC.  I believe these need to be further evaluated. Plan to arrange for pulmonary function testing to evaluate for obstructive lung disease.  I will empirically start GERD therapy to see if she gets benefit.  We will arrange for bronchoscopy with BAL and culture data.  If we do isolate Mycobacterium then we will have to talk about initiating treatment.  I will try to get the bronchoscopy done at [facility] next month.  We will need to be on a Monday Wednesday Friday.  Time spent 31 minutes   Baltazar Apo, MD, PhD 08/21/2021, 11:39 AM [facility] [phone] or if no answer [phone]

## 2021-08-21 NOTE — Telephone Encounter (Signed)
Lets check 9/19 at [facility]

## 2021-08-24 NOTE — Telephone Encounter (Signed)
I can do any time between 9/20 thru 9/23 at [facility] [place]. But I cannot to 9/14. You can change it from MAC to conscious sedation if that would be helpful

## 2021-08-24 NOTE — Telephone Encounter (Signed)
Pt has been rescheduled to 9/22 at 1:15 at [facility].  That is with MAC.  She will go for covid test on 9/19.  I gave new appt info to pt.

## 2021-08-24 NOTE — Telephone Encounter (Signed)
Sounds good thanks

## 2021-08-24 NOTE — Telephone Encounter (Signed)
Dr Lamonte Sakai - I just called scheduling and the same issue - no MAC available for 9/19.  I've had them to leave her on 9/14 for now.

## 2021-08-25 ENCOUNTER — Ambulatory Visit: Payer: Managed Care, Other (non HMO)

## 2021-08-28 ENCOUNTER — Other Ambulatory Visit: Payer: Self-pay

## 2021-08-28 ENCOUNTER — Ambulatory Visit: Payer: Medicare Other | Attending: Family Medicine

## 2021-08-28 VITALS — BP 148/82

## 2021-08-28 DIAGNOSIS — R293 Abnormal posture: Secondary | ICD-10-CM | POA: Diagnosis present

## 2021-08-28 DIAGNOSIS — M6281 Muscle weakness (generalized): Secondary | ICD-10-CM | POA: Diagnosis present

## 2021-08-28 DIAGNOSIS — R2689 Other abnormalities of gait and mobility: Secondary | ICD-10-CM | POA: Diagnosis present

## 2021-08-28 DIAGNOSIS — R2681 Unsteadiness on feet: Secondary | ICD-10-CM | POA: Insufficient documentation

## 2021-08-28 NOTE — Therapy (Signed)
[facility] [address] [place], [place], [address] Phone: [phone]   Fax:  [phone]  Patient Details  Name: Heather [name] MRN: [id number] Date of Birth: 07/05/1971 Referring Provider:  No ref. provider found  Encounter Date: 08/28/2021 PHYSICAL THERAPY DISCHARGE SUMMARY non visit  Visits from Start of Care: 6  Current functional level related to goals / functional outcomes: Unknown as pt has not returned since 09/17/20 after being put on hold due to elevated BP.   Remaining deficits: Unknown at this time   Education / Equipment:    Patient agrees to discharge. Patient goals were not met. Patient is being discharged due to not returning since the last visit.   Electa Sniff, PT, DPT, NCS 08/28/2021, 8:36 AM  [facility] [address] [name], [place], [address] Phone: [phone]   Fax:  [phone]

## 2021-08-29 NOTE — Therapy (Signed)
[facility] [address] [place], [place], [address] Phone: [phone]   Fax:  [phone]  Physical Therapy Evaluation  Patient Details  Name: Heather [name] MRN: [id number] Date of Birth: 1971-08-20 Referring Provider (PT): Madelon Lips   Encounter Date: 08/28/2021   PT End of Session - 08/28/21 1408     Visit Number 1    Number of Visits 17    Date for PT Re-Evaluation 10/24/21    Authorization Type cigna    PT Start Time 1405    PT Stop Time 1446    PT Time Calculation (min) 41 min    Equipment Utilized During Treatment Gait belt    Activity Tolerance Patient tolerated treatment well    Behavior During Therapy [facility] for tasks assessed/performed             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[place]), nonhemorrhagic, inferior right cerebellum 12/03/2017   left side weakness in leg   Cyst of left ovary 01/12/2018   Diabetic neuropathy, painful ([place]), on low dose Gabapentin 07/13/2013   DM (diabetes mellitus), type 2, uncontrolled, with renal complications ([place]) 99/24/2683   DM2 (diabetes mellitus, type 2) ([place])    Dysfunction of left eustachian tube, with pusatile tinnitus 11/24/2017   Dyslipidemia associated with type 2 diabetes mellitus ([place]) 06/25/2016   ESRD (end stage renal disease) on dialysis [facility])    On hemodialysis in July 2019 via [facility] then switched to [facility] in Nov 2019.    ESRD with anemia (HCC)    Fibromyalgia    Gastroparesis due to DM    GERD (gastroesophageal reflux disease)    Headache    Hypertension associated with diabetes ([place]) 02/19/2011   IBS (irritable bowel syndrome)    Left-sided weakness 12/10/2017   .   Leiomyoma of uterus    Pancreatitis    PE (pulmonary thromboembolism)  ([place]) 11/04/2019   Pneumonia    Proliferative diabetic retinopathy ([place]) 09/29/2015   Pronation deformity of both feet 06/14/2014   Reactive depression 12/10/2017   .   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[place]) 02/17/2018   Thromboembolism ([place]) 04/05/2018   Upper airway cough syndrome, with recs to stay off ACE and take Pepcid q hs 11/15/2016    Past Surgical History:  Procedure Laterality Date   A/V FISTULAGRAM Right 03/03/2020   Procedure: Venogram;  Surgeon: Waynetta Sandy, MD;  Location: [facility];  Service: Cardiovascular;  Laterality: Right;   ARTERY REPAIR Right 02/17/2018   Procedure: EXPLORATION OF RIGHT BRACHIAL ARTERY;  Surgeon: Conrad Glasgow, MD;  Location: [facility];  Service: Vascular;  Laterality: Right;   ARTERY REPAIR Right 02/17/2018   Procedure: BRACHIAL ARTERY EXPLORATION AND TRHOMBECTOMY;  Surgeon: Conrad Ma[place]ville, MD;  Location: [facility];  Service: Vascular;  Laterality: Right;   AV FISTULA PLACEMENT Left 05/09/2017   Procedure: INSERTION OF ARTERIOVENOUS (AV) GRAFT ARM (ARTEGRAFT);  Surgeon: Conrad [place], MD;  [facility];  Service: Vascular;  Laterality: Left;   AV FISTULA PLACEMENT Right 02/17/2018   Procedure: INSERTION OF ARTERIOVENOUS (AV) GORE-TEX GRAFT ARM RIGHT UPPER ARM;  Surgeon: Conrad Latimer, MD;  Location: [facility];  Service: Vascular;  Laterality:  Right;   AV FISTULA PLACEMENT Left 10/10/2019   Procedure: INSERTION OF LEFT ARTERIOVENOUS (AV) ARTEGRAFT GRAFT ARM;  Surgeon: Waynetta Sandy, MD;  Location: [facility];  Service: Vascular;  Laterality: Left;   AV FISTULA PLACEMENT Right 03/19/2020   Procedure: INSERTION OF ARTERIOVENOUS (AV) GORE-TEX GRAFT IN RIGHT ARM AND VIABAHN STENT IN RIGHT AXILLARY ARTERY;  Surgeon: Waynetta Sandy, MD;  Location: [facility];  Service: Vascular;  Laterality: Right;   [place] Left  08/31/2016   Procedure: BASILIC VEIN TRANSPOSITION FIRST STAGE;  Surgeon: Conrad Sully, MD;  Location: [facility];  Service: Vascular;  Laterality: Left;   CENTRAL VENOUS CATHETER INSERTION Left 07/24/2018   Procedure: INSERTION CENTRAL LINE ADULT;  Surgeon: Conrad Franklin, MD;  Location: [facility];  Service: Vascular;  Laterality: Left;   EYE SURGERY     secondary to diabetic retinopathy    FOOT SURGERY Right    t"ook bone out- maybe hammer toe"   INSERTION OF DIALYSIS CATHETER Left 07/24/2018   Procedure: INSERTION OF TUNNELED DIALYSIS CATHETER;  Surgeon: Conrad Lester, MD;  Location: [facility];  Service: Vascular;  Laterality: Left;   IR FLUORO GUIDE CV LINE RIGHT  07/21/2018   IR FLUORO GUIDE CV LINE RIGHT  06/01/2019   IR FLUORO GUIDE CV LINE RIGHT  05/05/2020   IR REMOVAL TUN CV CATH W/O FL  01/23/2021   IR US GUIDE VASC ACCESS RIGHT  07/21/2018   IR US GUIDE VASC ACCESS RIGHT  06/01/2019   LIGATION ARTERIOVENOUS GORTEX GRAFT Right 03/20/2020   Procedure: LIGATION ARM ARTERIOVENOUS GORTEX GRAFT With Patch Angioplasty, Thrombectomy;  Surgeon: Waynetta Sandy, MD;  Location: [facility];  Service: Vascular;  Laterality: Right;   LIGATION OF ARTERIOVENOUS  FISTULA Left 05/09/2017   Procedure: LIGATION OF ARTERIOVENOUS  FISTULA;  Surgeon: Conrad Kingfisher, MD;  Location: [facility];  Service: Vascular;  Laterality: Left;   LOOP RECORDER INSERTION N/A 12/05/2017   Procedure: LOOP RECORDER INSERTION;  Surgeon: Evans Lance, MD;  Location: [facility];  Service: Cardiovascular;  Laterality: N/A;   MYOMECTOMY     REMOVAL OF GRAFT Right 02/17/2018   Procedure: REMOVAL OF RIGHT UPPER ARM ARTERIOVENOUS GRAFT;  Surgeon: Conrad [place], MD;  Location: [facility];  Service: Vascular;  Laterality: Right;   REVISION OF ARTERIOVENOUS GORETEX GRAFT Left 07/24/2018   Procedure: REDO ARTERIOVENOUS GORETEX GRAFT;  Surgeon: Conrad Petrey, MD;  Location: [facility];  Service: Vascular;  Laterality: Left;   TEE WITHOUT CARDIOVERSION N/A  12/05/2017   Procedure: TRANSESOPHAGEAL ECHOCARDIOGRAM (TEE);  Surgeon: Sanda Klein, MD;  Location: [facility];  Service: Cardiovascular;  Laterality: N/A;   UPPER EXTREMITY VENOGRAPHY Left 07/13/2018   Procedure: UPPER EXTREMITY VENOGRAPHY - Central & Left Arm;  Surgeon: Conrad [place] Bridges, MD;  Location: [facility];  Service: Cardiovascular;  Laterality: Left;   UPPER EXTREMITY VENOGRAPHY Bilateral 09/10/2019   Procedure: UPPER EXTREMITY VENOGRAPHY;  Surgeon: Waynetta Sandy, MD;  Location: [facility];  Service: Cardiovascular;  Laterality: Bilateral;   UTERINE FIBROID SURGERY     VITRECTOMY Bilateral     Vitals:   08/28/21 1420  BP: (!) 148/82      Subjective Assessment - 08/28/21 1409     Subjective Pt had right BKA by Dr. Nyoka Cowden on 03/06/21. Pt has dialysis Tues/Thursday/Saturday. Pt received her prosthesis about a 2-3 months ago. Pt's prosthetist was Anderson Malta in [employer] point at [employer]. Pt is not wearing  leg currently. She wears it less than doesn't. Only putting on leg when has to get to the bathroom and not everyday. Pt is wearing shrinker all the time leg is not on. Pt is using w/c for mobility. Does have RW.    Pertinent History extensive past medical history including but not limited to prior CVA in 2018, type 2 diabetes, dyslipidemia, end-stage renal disease on hemodialysis, GERD, hypertension    Patient Stated Goals Pt wants to be able to walk with her leg and without AD.    Currently in Pain? No/denies                [facility] PT Assessment - 08/28/21 1413       Assessment   Medical Diagnosis right BKA and end stage renal disease    Referring Provider (PT) Madelon Lips    Onset Date/Surgical Date 06/26/21   Received prosthesis about 2 months ago but now sure exact date. 03/06/21 surgical date.   Hand Dominance Right      Precautions   Precautions Fall      Balance Screen   Has the patient fallen in the past 6 months Yes    How many times?  1   fell last week when her w/c was not locked and tried to transfer.   Has the patient had a decrease in activity level because of a fear of falling?  Yes    Is the patient reluctant to leave their home because of a fear of falling?  Yes      [place] Private residence    Living Arrangements Spouse/significant other    Available Help at Discharge Family    Type of [place] to enter    Entrance Stairs-Number of Steps 3    Entrance Stairs-Rails Right;Left;Can reach both    [place] One level    [place] - 2 wheels;Cane - single point;Wheelchair - [employer] - 4 wheels;Shower seat      Prior Function   Level of Independence Independent    Vocation Full time employment    Vocation Requirements [employer]. Works remotely from home on [place] [employer], roller skating- used to be fun to do. Puzzles      Sensation   Light Touch Impaired by gross assessment    Additional Comments no light touch in left foot and lower leg.      ROM / Strength   AROM / PROM / Strength Strength      Strength   Strength Assessment Site Hip;Knee;Ankle    Right/Left Hip Right;Left    Right Hip Flexion 4+/5    Right Hip ABduction 4/5    Left Hip Flexion 4+/5    Left Hip ABduction 4+/5    Right/Left Knee Right;Left    Right Knee Flexion 4+/5    Right Knee Extension 4+/5    Left Knee Flexion 5/5    Left Knee Extension 4/5    Right/Left Ankle Left    Right Ankle Dorsiflexion 2-/5      Transfers   Transfers Sit to Stand;Stand to Sit    Sit to Stand 5: Supervision    Stand to Sit 5: Supervision      Ambulation/Gait   Ambulation/Gait Yes    Ambulation/Gait Assistance 5: Supervision;4: Min guard    Ambulation Distance (Feet) 75 Feet    Assistive device Rolling walker;Prosthesis    Gait Pattern  Step-through pattern;Decreased step length - right;Decreased step length - left    Ambulation Surface Level;Indoor     Gait velocity 46.63 sec=0.55m/s      Standardized Balance Assessment   Standardized Balance Assessment Berg Balance Test;Timed Up and Go Test      Berg Balance Test   Standing Unsupported Able to stand 2 minutes with supervision      Timed Up and Go Test   TUG Normal TUG    Normal TUG (seconds) 37             Prosthetics Assessment - 08/29/21 0001       Prosthetics   Prosthetic Care Dependent with Skin check;Prosthetic cleaning;Proper wear schedule/adjustment;Correct ply sock adjustment;Care of non-amputated limb    Prosthetic Care Comments  Pt was instructed to begin wearing prosthesis 1 hour 2x/day on non dialysis days. To increase to 2 hours 2x/day next Monday as long as skin doing well. To monitor skin closely.    Donning prosthesis  Supervision    Current prosthetic wear tolerance (days/week)  sporadic    Current prosthetic wear tolerance (#hours/day)  just for short period to get in bathroom. <1 hour    Residual limb condition  skin intact    Prosthesis Description dual suction suspension                       Objective measurements completed on examination: See above findings.               PT Education - 08/29/21 1857     Education Details PT plan of care. Prosthetic education    Person(s) Educated Patient;Spouse    Methods Explanation    Comprehension Verbalized understanding              PT Short Term Goals - 08/29/21 1905       PT SHORT TERM GOAL #1   Title Pt will be independent with initial HEP for strengtheining and balance to decrease fall risk.    Baseline no current HEP    Time 4    Period Weeks    Status New    Target Date 09/26/21      PT SHORT TERM GOAL #2   Title Pt will be independent with prosthetic management for improved function.    Baseline currently needs assist and education    Time 4    Period Weeks    Status New    Target Date 09/26/21      PT SHORT TERM GOAL #3   Title Pt will be able to wear  prosthesis for total of 8 hours/day for improved function on non dialysis days    Baseline currently not wearing consistently    Time 4    Period Weeks    Status New    Target Date 09/26/21      PT SHORT TERM GOAL #4   Title Pt will decrease TUG from 37 sec to <30 sec for improved balance and decreased fall risk.    Baseline 08/28/21 37 sec    Time 4    Period Weeks    Status New    Target Date 09/26/21      PT SHORT TERM GOAL #5   Title Pt will ambulate >400' on level surfaces with RW and prosthesis mod I for improved household and short community distances.    Baseline 7' CGA with RW    Time 4    Period Weeks  Status New    Target Date 09/26/21      Additional Short Term Goals   Additional Short Term Goals Yes      PT SHORT TERM GOAL #6   Title Pt will ambulate up/down 3 steps with rails supervision to enter/exit home safely.    Baseline husband bringing her up/down in w/c    Time 4    Period Weeks    Status New    Target Date 09/26/21               PT Long Term Goals - 08/29/21 1910       PT LONG TERM GOAL #1   Title Pt will be able to tolerate wearing prosthesis all awake hours for improved function.    Baseline not wearing consistently at eval    Time 8    Period Weeks    Status New    Target Date 10/24/21      PT LONG TERM GOAL #2   Title Berg goal TBD    Time 8    Period Weeks    Status New    Target Date 10/24/21      PT LONG TERM GOAL #3   Title Pt will increase gait speed from 0.26m/s to >0.6 m/s for improved household mobility.    Baseline 08/28/21 0.24m/s    Time 8    Period Weeks    Status New    Target Date 10/24/21      PT LONG TERM GOAL #4   Title Pt will ambulate >600' with LRAD mod I on level indoor and outdoor surfaces.    Baseline 91' with RW CGA    Time 8    Period Weeks    Status New    Target Date 10/24/21      PT LONG TERM GOAL #5   Title Pt will ambulate up/down curb and ramp with LRAD supervision for improved  community access.    Baseline unable to perform    Time 8    Period Weeks    Status New    Target Date 10/24/21                    Plan - 08/29/21 1858     Clinical Impression Statement Pt is 50 y/o female with recent right BKA 03/06/21 who received her prosthesis about 2 months ago. Prosthetist was Engineer, civil (consulting) at [employer] in [place]. Pt has not been wearing prosthesis at all consistently.  She has extensive PMH including prior CVA in 2018, type 2 diabetes, dyslipidemia, end-stage renal disease on hemodialysis, GERD, hypertension. Neuropathy in LLE with no sensation to light touch in lower left leg and foot. Pt has foot drop on left with 2-/5 DF strength. Pt has been using w/c for mobilty. Was able to walk with RW at eval with slow gait speed of 0.76m/s indicating decreased safety for household mobility. [name] fall risk based on TUG of 37 sec. PT will further assess balance with Berg next visit. Pt will benefit from skilled PT to address strength, balance, functional mobility and prosthetic training needs.    Personal Factors and Comorbidities Comorbidity 3+    Comorbidities CVA in 2018, type 2 diabetes, dyslipidemia, end-stage renal disease on hemodialysis, GERD, hypertension    Examination-Activity Limitations Transfers;Stand;Locomotion Level;Stairs    Examination-Participation Restrictions Community Activity;Cleaning    Stability/Clinical Decision Making Evolving/Moderate complexity    Clinical Decision Making Moderate    Rehab Potential Good  PT Frequency 2x / week   plus eval   PT Duration 8 weeks    PT Treatment/Interventions ADLs/Self Care Home Management;DME Instruction;Gait training;Stair training;Functional mobility training;Neuromuscular re-education;Balance training;Therapeutic exercise;Therapeutic activities;Patient/family education;Orthotic Fit/Training;Manual techniques;Passive range of motion;Energy conservation;Prosthetic Training    PT Next Visit Plan Monitor BP with  activity. Prosthetic education. Perform Berg Balance and write LTG. Begin gait training and initial strengthening/balance HEP. Stair training.    Consulted and Agree with Plan of Care Patient;Family member/caregiver    Family Member Consulted husband, Juanda Crumble             Patient will benefit from skilled therapeutic intervention in order to improve the following deficits and impairments:  Abnormal gait, Decreased activity tolerance, Decreased balance, Decreased endurance, Decreased knowledge of use of DME, Decreased mobility, Decreased range of motion, Difficulty walking, Decreased strength, Impaired sensation, Prosthetic Dependency  Visit Diagnosis: Other abnormalities of gait and mobility  Muscle weakness (generalized)  Abnormal posture  Unsteadiness on feet     Problem List Patient Active Problem List   Diagnosis Date Noted   S/P BKA (below knee amputation), right ([place]) 03/31/2021   Impaired mobility and ADLs 03/25/2021   CVA (cerebral vascular accident) ([place]) 03/04/2021   Pulmonary embolus ([place]) 03/04/2021   Charcot ankle, right 02/23/2021   Hx of cardiac arrest 11/12/2020   Abnormal CT of the chest 09/26/2020   Allergy, unspecified, initial encounter 09/22/2020   Anaphylactic shock, unspecified, initial encounter 09/22/2020   Fluid overload, unspecified 06/10/2020   Other mechanical complication of other cardiac and vascular devices and implants, initial encounter ([place]) 05/10/2020   Positive ANA (antinuclear antibody) 04/16/2020   ESRD on dialysis ([place])    Hemiplegia and hemiparesis following cerebral infarction affecting left non-dominant side ([place]) 12/29/2018   IBS (irritable bowel syndrome) 08/06/2017   Dyslipidemia associated with type 2 diabetes mellitus ([place]) 06/25/2016   Proliferative diabetic retinopathy ([place]) 09/29/2015   Chronic cough 10/04/2014   B12 deficiency 05/10/2014   Anemia in chronic kidney disease 04/26/2014   CKD (chronic kidney disease) stage  5, GFR less than 15 ml/min (HCC) 04/26/2014   Secondary hyperparathyroidism of renal origin ([place]) 11/27/2013   Posterior subcapsular cataract, bilateral 10/18/2013   Diabetic neuropathy, painful ([place]) 07/13/2013   Diabetes mellitus, type II, insulin dependent ([place]), on CGM, with end organ damage: renal, neuropathy, gastroparesis, retinopathy 02/19/2011   Hypertension associated with diabetes ([place]) 02/19/2011   Gastroparesis due to DM ([place]) 02/19/2011    Electa Sniff, PT, DPT, NCS 08/29/2021, 7:15 PM  [facility] [address] [place], [place], [address] Phone: [phone]   Fax:  [phone]  Name: Heather [name] MRN: [id number] Date of Birth: Mar 21, 1971

## 2021-09-09 ENCOUNTER — Ambulatory Visit: Payer: Medicare Other

## 2021-09-09 ENCOUNTER — Other Ambulatory Visit: Payer: Self-pay

## 2021-09-09 ENCOUNTER — Other Ambulatory Visit: Payer: Self-pay | Admitting: Emergency Medicine

## 2021-09-09 VITALS — BP 142/88

## 2021-09-09 DIAGNOSIS — M6281 Muscle weakness (generalized): Secondary | ICD-10-CM

## 2021-09-09 DIAGNOSIS — R2689 Other abnormalities of gait and mobility: Secondary | ICD-10-CM | POA: Diagnosis not present

## 2021-09-09 DIAGNOSIS — R2681 Unsteadiness on feet: Secondary | ICD-10-CM

## 2021-09-09 NOTE — Therapy (Signed)
[facility] [address] [place], [place], [address] Phone: [phone]   Fax:  [phone]  Physical Therapy Treatment  Patient Details  Name: Heather [name] MRN: [id number] Date of Birth: Mar 08, 1971 Referring Provider (PT): Madelon Lips   Encounter Date: 09/09/2021   PT End of Session - 09/09/21 1104     Visit Number 2    Number of Visits 17    Date for PT Re-Evaluation 10/24/21    Authorization Type cigna    PT Start Time 1101    PT Stop Time 1149    PT Time Calculation (min) 48 min    Equipment Utilized During Treatment Gait belt    Activity Tolerance Patient tolerated treatment well    Behavior During Therapy [facility] for tasks assessed/performed             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[place]), nonhemorrhagic, inferior right cerebellum 12/03/2017   left side weakness in leg   Cyst of left ovary 01/12/2018   Diabetic neuropathy, painful ([place]), on low dose Gabapentin 07/13/2013   DM (diabetes mellitus), type 2, uncontrolled, with renal complications ([place]) 36/64/4034   DM2 (diabetes mellitus, type 2) ([place])    Dysfunction of left eustachian tube, with pusatile tinnitus 11/24/2017   Dyslipidemia associated with type 2 diabetes mellitus ([place]) 06/25/2016   ESRD (end stage renal disease) on dialysis [facility])    On hemodialysis in July 2019 via [facility] then switched to [facility] in Nov 2019.    ESRD with anemia (HCC)    Fibromyalgia    Gastroparesis due to DM    GERD (gastroesophageal reflux disease)    Headache    Hypertension associated with diabetes ([place]) 02/19/2011   IBS (irritable bowel syndrome)    Left-sided weakness 12/10/2017   .   Leiomyoma of uterus    Pancreatitis    PE (pulmonary thromboembolism)  ([place]) 11/04/2019   Pneumonia    Proliferative diabetic retinopathy ([place]) 09/29/2015   Pronation deformity of both feet 06/14/2014   Reactive depression 12/10/2017   .   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[place]) 02/17/2018   Thromboembolism ([place]) 04/05/2018   Upper airway cough syndrome, with recs to stay off ACE and take Pepcid q hs 11/15/2016    Past Surgical History:  Procedure Laterality Date   A/V FISTULAGRAM Right 03/03/2020   Procedure: Venogram;  Surgeon: Waynetta Sandy, MD;  Location: [facility];  Service: Cardiovascular;  Laterality: Right;   ARTERY REPAIR Right 02/17/2018   Procedure: EXPLORATION OF RIGHT BRACHIAL ARTERY;  Surgeon: Conrad De [place]oto, MD;  Location: [place];  Service: Vascular;  Laterality: Right;   ARTERY REPAIR Right 02/17/2018   Procedure: BRACHIAL ARTERY EXPLORATION AND TRHOMBECTOMY;  Surgeon: Conrad Newberg, MD;  Location: [place];  Service: Vascular;  Laterality: Right;   AV FISTULA PLACEMENT Left 05/09/2017   Procedure: INSERTION OF ARTERIOVENOUS (AV) GRAFT ARM (ARTEGRAFT);  Surgeon: Conrad Allendale, MD;  Location[facility];  Service: Vascular;  Laterality: Left;   AV FISTULA PLACEMENT Right 02/17/2018   Procedure: INSERTION OF ARTERIOVENOUS (AV) GORE-TEX GRAFT ARM RIGHT UPPER ARM;  Surgeon: Conrad , MD;  Location: [place];  Service: Vascular;  Laterality:  Right;   AV FISTULA PLACEMENT Left 10/10/2019   Procedure: INSERTION OF LEFT ARTERIOVENOUS (AV) ARTEGRAFT GRAFT ARM;  Surgeon: Waynetta Sandy, MD;  Location: [place];  Service: Vascular;  Laterality: Left;   AV FISTULA PLACEMENT Right 03/19/2020   Procedure: INSERTION OF ARTERIOVENOUS (AV) GORE-TEX GRAFT IN RIGHT ARM AND VIABAHN STENT IN RIGHT AXILLARY ARTERY;  Surgeon: Waynetta Sandy, MD;  Location: [place];  Service: Vascular;  Laterality: Right;   [place] Left  08/31/2016   Procedure: BASILIC VEIN TRANSPOSITION FIRST STAGE;  Surgeon: Conrad [place], MD;  Location: [place];  Service: Vascular;  Laterality: Left;   CENTRAL VENOUS CATHETER INSERTION Left 07/24/2018   Procedure: INSERTION CENTRAL LINE ADULT;  Surgeon: Conrad Kimberly, MD;  Location: [place];  Service: Vascular;  Laterality: Left;   EYE SURGERY     secondary to diabetic retinopathy    FOOT SURGERY Right    t"ook bone out- maybe hammer toe"   INSERTION OF DIALYSIS CATHETER Left 07/24/2018   Procedure: INSERTION OF TUNNELED DIALYSIS CATHETER;  Surgeon: Conrad Mandaree, MD;  Location: [place];  Service: Vascular;  Laterality: Left;   IR FLUORO GUIDE CV LINE RIGHT  07/21/2018   IR FLUORO GUIDE CV LINE RIGHT  06/01/2019   IR FLUORO GUIDE CV LINE RIGHT  05/05/2020   IR REMOVAL TUN CV CATH W/O FL  01/23/2021   IR US GUIDE VASC ACCESS RIGHT  07/21/2018   IR US GUIDE VASC ACCESS RIGHT  06/01/2019   LIGATION ARTERIOVENOUS GORTEX GRAFT Right 03/20/2020   Procedure: LIGATION ARM ARTERIOVENOUS GORTEX GRAFT With Patch Angioplasty, Thrombectomy;  Surgeon: Waynetta Sandy, MD;  Location: [place];  Service: Vascular;  Laterality: Right;   LIGATION OF ARTERIOVENOUS  FISTULA Left 05/09/2017   Procedure: LIGATION OF ARTERIOVENOUS  FISTULA;  Surgeon: Conrad [place], MD;  Location: [place];  Service: Vascular;  Laterality: Left;   LOOP RECORDER INSERTION N/A 12/05/2017   Procedure: LOOP RECORDER INSERTION;  Surgeon: Evans Lance, MD;  Location: [facility];  Service: Cardiovascular;  Laterality: N/A;   MYOMECTOMY     REMOVAL OF GRAFT Right 02/17/2018   Procedure: REMOVAL OF RIGHT UPPER ARM ARTERIOVENOUS GRAFT;  Surgeon: Conrad [place], MD;  Location: [place];  Service: Vascular;  Laterality: Right;   REVISION OF ARTERIOVENOUS GORETEX GRAFT Left 07/24/2018   Procedure: REDO ARTERIOVENOUS GORETEX GRAFT;  Surgeon: Conrad Royal[place], MD;  Location: [place];  Service: Vascular;  Laterality: Left;   TEE WITHOUT CARDIOVERSION N/A  12/05/2017   Procedure: TRANSESOPHAGEAL ECHOCARDIOGRAM (TEE);  Surgeon: Sanda Klein, MD;  Location: [place];  Service: Cardiovascular;  Laterality: N/A;   UPPER EXTREMITY VENOGRAPHY Left 07/13/2018   Procedure: UPPER EXTREMITY VENOGRAPHY - Central & Left Arm;  Surgeon: Conrad Worth, MD;  Location: [place] [facility];  Service: Cardiovascular;  Laterality: Left;   UPPER EXTREMITY VENOGRAPHY Bilateral 09/10/2019   Procedure: UPPER EXTREMITY VENOGRAPHY;  Surgeon: Waynetta Sandy, MD;  Location: [place] [facility];  Service: Cardiovascular;  Laterality: Bilateral;   UTERINE FIBROID SURGERY     VITRECTOMY Bilateral     Vitals:   09/09/21 1106  BP: (!) 142/88     Subjective Assessment - 09/09/21 1104     Subjective Pt reports that she is wearing leg 1 hour 2x/day. Walked in on walker today.    Pertinent History extensive past medical history including but not limited to prior CVA in 2018, type 2 diabetes,  dyslipidemia, end-stage renal disease on hemodialysis, GERD, hypertension    Patient Stated Goals Pt wants to be able to walk with her leg and without AD.    Currently in Pain? No/denies                               [facility] Adult PT Treatment/Exercise - 09/09/21 1109       Transfers   Transfers Sit to Stand;Stand to Sit    Sit to Stand 5: Supervision;With upper extremity assist    Stand to Sit 5: Supervision    Comments Pt had reported difficulty with getting prosthesis in car so went out to car to practice. Pt instructed to sit first and then bring one leg in at a time. Was not picking up prosthesis enough which was causing it to bump outside of car. Instructed to pick up more from hip and able to clear leg without removing prosthesis.      Ambulation/Gait   Ambulation/Gait Yes    Ambulation/Gait Assistance 5: Supervision    Ambulation/Gait Assistance Details Verbal cues to pick up front of walker over bumps in sidewalk.    Ambulation Distance  (Feet) 150 Feet    Assistive device Rolling walker;Prosthesis    Gait Pattern Step-through pattern;Decreased step length - right;Decreased step length - left    Ambulation Surface Level;Unlevel;Indoor;Paved;Outdoor      Standardized Balance Assessment   Standardized Balance Assessment Chief Technology Officer Test   Sit to Stand Able to stand  independently using hands    Standing Unsupported Able to stand 2 minutes with supervision    Sitting with Back Unsupported but Feet Supported on Floor or Stool Able to sit safely and securely 2 minutes    Stand to Sit Controls descent by using hands    Transfers Able to transfer with verbal cueing and /or supervision    Standing Unsupported with Eyes Closed Able to stand 3 seconds    Standing Ubsupported with Feet Together Needs help to attain position but able to stand for 30 seconds with feet together    From Standing, Reach Forward with Outstretched Arm Can reach forward >5 cm safely (2")    From Standing Position, Pick up Object from Floor Unable to pick up and needs supervision    From Standing Position, Turn to Look Behind Over each Shoulder Needs supervision when turning    Turn 360 Degrees Needs assistance while turning    Standing Unsupported, Alternately Place Feet on Step/Stool Able to complete >2 steps/needs minimal assist    Standing Unsupported, One Foot in [employer] balance while stepping or standing    Standing on One Leg Tries to lift leg/unable to hold 3 seconds but remains standing independently    Total Score 24      Prosthetics   Prosthetic Care Comments  Pt was instructed to increase wear time to 2 hours, 2x/day. Also advised to try to wear 2 hours in afternoon on dialysis days. Discussed socks possibly being needed as day goes on and how to tell.    Current prosthetic wear tolerance (days/week)  4 days/week (non dialysis days)    Current prosthetic wear tolerance (#hours/day)  1 hour 2x/day    Residual limb  condition  skin intact    Education Provided Skin check;Correct ply sock adjustment;Proper wear schedule/adjustment;Residual limb care;Proper Donning;Proper Doffing    Person(s) Educated Patient  Education Method Explanation    Education Method Verbalized understanding    Donning Prosthesis Supervision    Doffing Prosthesis Supervision                     PT Education - 09/09/21 2013     Education Details Results of Rod Mae) Educated Patient    Methods Explanation    Comprehension Verbalized understanding              PT Short Term Goals - 08/29/21 1905       PT SHORT TERM GOAL #1   Title Pt will be independent with initial HEP for strengtheining and balance to decrease fall risk.    Baseline no current HEP    Time 4    Period Weeks    Status New    Target Date 09/26/21      PT SHORT TERM GOAL #2   Title Pt will be independent with prosthetic management for improved function.    Baseline currently needs assist and education    Time 4    Period Weeks    Status New    Target Date 09/26/21      PT SHORT TERM GOAL #3   Title Pt will be able to wear prosthesis for total of 8 hours/day for improved function on non dialysis days    Baseline currently not wearing consistently    Time 4    Period Weeks    Status New    Target Date 09/26/21      PT SHORT TERM GOAL #4   Title Pt will decrease TUG from 37 sec to <30 sec for improved balance and decreased fall risk.    Baseline 08/28/21 37 sec    Time 4    Period Weeks    Status New    Target Date 09/26/21      PT SHORT TERM GOAL #5   Title Pt will ambulate >400' on level surfaces with RW and prosthesis mod I for improved household and short community distances.    Baseline 25' CGA with RW    Time 4    Period Weeks    Status New    Target Date 09/26/21      Additional Short Term Goals   Additional Short Term Goals Yes      PT SHORT TERM GOAL #6   Title Pt will ambulate up/down 3 steps with  rails supervision to enter/exit home safely.    Baseline husband bringing her up/down in w/c    Time 4    Period Weeks    Status New    Target Date 09/26/21               PT Long Term Goals - 09/09/21 2013       PT LONG TERM GOAL #1   Title Pt will be able to tolerate wearing prosthesis all awake hours for improved function.    Baseline not wearing consistently at eval    Time 8    Period Weeks    Status New      PT LONG TERM GOAL #2   Title Pt will increase Berg Balance form 24 to >40/56 for improved balance and decreased fall risk.    Baseline 09/09/21 24/56    Time 8    Period Weeks    Status New      PT LONG TERM GOAL #3   Title Pt will increase gait speed from  0.31m/s to >0.6 m/s for improved household mobility.    Baseline 08/28/21 0.50m/s    Time 8    Period Weeks    Status New      PT LONG TERM GOAL #4   Title Pt will ambulate >600' with LRAD mod I on level indoor and outdoor surfaces.    Baseline 6' with RW CGA    Time 8    Period Weeks    Status New      PT LONG TERM GOAL #5   Title Pt will ambulate up/down curb and ramp with LRAD supervision for improved community access.    Baseline unable to perform    Time 8    Period Weeks    Status New                   Plan - 09/09/21 2014     Clinical Impression Statement PT assessed Merrilee Jansky Balance today with pt scoring 24/56 indicating high fall risk. Pt was steadier when relying more on prosthetic leg.    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 Prosthetic education. Begin gait training and initial strengthening/balance HEP. Stair training. Is car transfer going better? I asked her to bring her brace for left foot to check out.    Consulted and Agree with Plan of Care Patient;Family member/caregiver    Family Member Consulted husband, Juanda Crumble             Patient will benefit from skilled therapeutic intervention in order to improve the following deficits and impairments:  Abnormal gait, Decreased activity tolerance, Decreased balance, Decreased endurance, Decreased knowledge of use of DME, Decreased mobility, Decreased range of motion, Difficulty walking, Decreased strength, Impaired sensation, Prosthetic Dependency  Visit Diagnosis: Other abnormalities of gait and mobility  Muscle weakness (generalized)  Unsteadiness on feet     Problem List Patient Active Problem List   Diagnosis Date Noted   S/P BKA (below knee amputation), right ([place]) 03/31/2021   Impaired mobility and ADLs 03/25/2021   CVA (cerebral vascular accident) ([place]) 03/04/2021   Pulmonary embolus ([place]) 03/04/2021   Charcot ankle, right 02/23/2021   Hx of cardiac arrest 11/12/2020   Abnormal CT of the chest 09/26/2020   Allergy, unspecified, initial encounter 09/22/2020   Anaphylactic shock, unspecified, initial encounter 09/22/2020   Fluid overload, unspecified 06/10/2020   Other mechanical complication of other cardiac and vascular devices and implants, initial encounter ([place]) 05/10/2020   Positive ANA (antinuclear antibody) 04/16/2020   ESRD on dialysis ([place])    Hemiplegia and hemiparesis  following cerebral infarction affecting left non-dominant side ([place]) 12/29/2018   IBS (irritable bowel syndrome) 08/06/2017   Dyslipidemia associated with type 2 diabetes mellitus ([place]) 06/25/2016   Proliferative diabetic retinopathy ([place]) 09/29/2015   Chronic cough 10/04/2014   B12 deficiency 05/10/2014   Anemia in chronic kidney disease 04/26/2014   CKD (chronic kidney disease) stage 5, GFR  less than 15 ml/min ([place]) 04/26/2014   Secondary hyperparathyroidism of renal origin ([place]) 11/27/2013   Posterior subcapsular cataract, bilateral 10/18/2013   Diabetic neuropathy, painful ([place]) 07/13/2013   Diabetes mellitus, type II, insulin dependent ([place]), on CGM, with end organ damage: renal, neuropathy, gastroparesis, retinopathy 02/19/2011   Hypertension associated with diabetes ([place]) 02/19/2011   Gastroparesis due to DM ([place]) 02/19/2011    Electa Sniff, PT, DPT, NCS 09/09/2021, 8:17 PM  [facility] [address] [place], [place], [address] Phone: [phone]   Fax:  [phone]  Name: TONIYA ROZAR MRN: [id number] Date of Birth: 09-24-1971

## 2021-09-10 ENCOUNTER — Encounter: Payer: Self-pay | Admitting: Gastroenterology

## 2021-09-10 ENCOUNTER — Other Ambulatory Visit: Payer: Self-pay

## 2021-09-11 ENCOUNTER — Ambulatory Visit: Payer: Medicare Other

## 2021-09-11 DIAGNOSIS — M6281 Muscle weakness (generalized): Secondary | ICD-10-CM

## 2021-09-11 DIAGNOSIS — R2681 Unsteadiness on feet: Secondary | ICD-10-CM

## 2021-09-11 DIAGNOSIS — R2689 Other abnormalities of gait and mobility: Secondary | ICD-10-CM | POA: Diagnosis not present

## 2021-09-11 NOTE — Therapy (Signed)
[facility] [address] [place], [place], [address] Phone: [phone]   Fax:  [phone]  Physical Therapy Treatment  Patient Details  Name: Heather [name] MRN: [id number] Date of Birth: [birth year] Referring Provider (PT): Madelon Lips   Encounter Date: 09/11/2021   PT End of Session - 09/11/21 1029     Visit Number 3    Number of Visits 17    Date for PT Re-Evaluation 10/24/21    Authorization Type cigna    PT Start Time 1020    PT Stop Time 1104    PT Time Calculation (min) 44 min    Equipment Utilized During Treatment Gait belt    Activity Tolerance Patient tolerated treatment well    Behavior During Therapy [facility] for tasks assessed/performed             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[place]), nonhemorrhagic, inferior right cerebellum 12/03/2017   left side weakness in leg   Cyst of left ovary 01/12/2018   Diabetic neuropathy, painful ([place]), on low dose Gabapentin 07/13/2013   DM (diabetes mellitus), type 2, uncontrolled, with renal complications ([place]) 37/09/6268   DM2 (diabetes mellitus, type 2) ([place])    Dysfunction of left eustachian tube, with pusatile tinnitus 11/24/2017   Dyslipidemia associated with type 2 diabetes mellitus ([place]) 06/25/2016   ESRD (end stage renal disease) on dialysis [facility])    On hemodialysis in July 2019 via [facility] then switched to [facility] in Nov 2019.    ESRD with anemia (HCC)    Fibromyalgia    Gastroparesis due to DM    GERD (gastroesophageal reflux disease)    Headache    Hypertension associated with diabetes ([place]) 02/19/2011   IBS (irritable bowel syndrome)    Left-sided weakness 12/10/2017   .   Leiomyoma of uterus    Pancreatitis    PE (pulmonary thromboembolism)  ([place]) 11/04/2019   Pneumonia    Proliferative diabetic retinopathy ([employer]) 09/29/2015   Pronation deformity of both feet 06/14/2014   Reactive depression 12/10/2017   .   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[place]) 02/17/2018   Thromboembolism ([place]) 04/05/2018   Upper airway cough syndrome, with recs to stay off ACE and take Pepcid q hs 11/15/2016    Past Surgical History:  Procedure Laterality Date   A/V FISTULAGRAM Right 03/03/2020   Procedure: Venogram;  Surgeon: Waynetta Sandy, MD;  Location: [facility];  Service: Cardiovascular;  Laterality: Right;   ARTERY REPAIR Right 02/17/2018   Procedure: EXPLORATION OF RIGHT BRACHIAL ARTERY;  Surgeon: Conrad Benson, MD;  Location: [facility];  Service: Vascular;  Laterality: Right;   ARTERY REPAIR Right 02/17/2018   Procedure: BRACHIAL ARTERY EXPLORATION AND TRHOMBECTOMY;  Surgeon: Conrad Addison, MD;  Location: [facility];  Service: Vascular;  Laterality: Right;   AV FISTULA PLACEMENT Left 05/09/2017   Procedure: INSERTION OF ARTERIOVENOUS (AV) GRAFT ARM (ARTEGRAFT);  Surgeon: Conrad Westphalia, MD;  Location: [facility];  Service: Vascular;  Laterality: Left;   AV FISTULA PLACEMENT Right 02/17/2018   Procedure: INSERTION OF ARTERIOVENOUS (AV) GORE-TEX GRAFT ARM RIGHT UPPER ARM;  Surgeon: Conrad Hannah, MD;  Location: [facility];  Service: Vascular;  Laterality:  Right;   AV FISTULA PLACEMENT Left 10/10/2019   Procedure: INSERTION OF LEFT ARTERIOVENOUS (AV) ARTEGRAFT GRAFT ARM;  Surgeon: Waynetta Sandy, MD;  Location: [facility];  Service: Vascular;  Laterality: Left;   AV FISTULA PLACEMENT Right 03/19/2020   Procedure: INSERTION OF ARTERIOVENOUS (AV) GORE-TEX GRAFT IN RIGHT ARM AND VIABAHN STENT IN RIGHT AXILLARY ARTERY;  Surgeon: Waynetta Sandy, MD;  Location: [facility];  Service: Vascular;  Laterality: Right;   [place] Left  08/31/2016   Procedure: BASILIC VEIN TRANSPOSITION FIRST STAGE;  Surgeon: Conrad [place]ville, MD;  Location: [facility];  Service: Vascular;  Laterality: Left;   CENTRAL VENOUS CATHETER INSERTION Left 07/24/2018   Procedure: INSERTION CENTRAL LINE ADULT;  Surgeon: Conrad Montgomery, MD;  Location: [facility];  Service: Vascular;  Laterality: Left;   EYE SURGERY     secondary to diabetic retinopathy    FOOT SURGERY Right    t"ook bone out- maybe hammer toe"   INSERTION OF DIALYSIS CATHETER Left 07/24/2018   Procedure: INSERTION OF TUNNELED DIALYSIS CATHETER;  Surgeon: Conrad [place], MD;  Location: [facility];  Service: Vascular;  Laterality: Left;   IR FLUORO GUIDE CV LINE RIGHT  07/21/2018   IR FLUORO GUIDE CV LINE RIGHT  06/01/2019   IR FLUORO GUIDE CV LINE RIGHT  05/05/2020   IR REMOVAL TUN CV CATH W/O FL  01/23/2021   IR US GUIDE VASC ACCESS RIGHT  07/21/2018   IR US GUIDE VASC ACCESS RIGHT  06/01/2019   LIGATION ARTERIOVENOUS GORTEX GRAFT Right 03/20/2020   Procedure: LIGATION ARM ARTERIOVENOUS GORTEX GRAFT With Patch Angioplasty, Thrombectomy;  Surgeon: Waynetta Sandy, MD;  Location: [facility];  Service: Vascular;  Laterality: Right;   LIGATION OF ARTERIOVENOUS  FISTULA Left 05/09/2017   Procedure: LIGATION OF ARTERIOVENOUS  FISTULA;  Surgeon: Conrad [place], MD;  Location: [facility];  Service: Vascular;  Laterality: Left;   LOOP RECORDER INSERTION N/A 12/05/2017   Procedure: LOOP RECORDER INSERTION;  Surgeon: Evans Lance, MD;  Location: [facility];  Service: Cardiovascular;  Laterality: N/A;   MYOMECTOMY     REMOVAL OF GRAFT Right 02/17/2018   Procedure: REMOVAL OF RIGHT UPPER ARM ARTERIOVENOUS GRAFT;  Surgeon: Conrad [place], MD;  Location: [facility];  Service: Vascular;  Laterality: Right;   REVISION OF ARTERIOVENOUS GORETEX GRAFT Left 07/24/2018   Procedure: REDO ARTERIOVENOUS GORETEX GRAFT;  Surgeon: Conrad [place], MD;  Location: [facility];  Service: Vascular;  Laterality: Left;   TEE WITHOUT CARDIOVERSION N/A  12/05/2017   Procedure: TRANSESOPHAGEAL ECHOCARDIOGRAM (TEE);  Surgeon: Sanda Klein, MD;  Location: [facility];  Service: Cardiovascular;  Laterality: N/A;   UPPER EXTREMITY VENOGRAPHY Left 07/13/2018   Procedure: UPPER EXTREMITY VENOGRAPHY - Central & Left Arm;  Surgeon: Conrad [place], MD;  Location: [facility];  Service: Cardiovascular;  Laterality: Left;   UPPER EXTREMITY VENOGRAPHY Bilateral 09/10/2019   Procedure: UPPER EXTREMITY VENOGRAPHY;  Surgeon: Waynetta Sandy, MD;  Location: [facility];  Service: Cardiovascular;  Laterality: Bilateral;   UTERINE FIBROID SURGERY     VITRECTOMY Bilateral     There were no vitals filed for this visit.   Subjective Assessment - 09/11/21 1029     Subjective Pt reports that the car transfer is going better. [name] finding that right knee feels a little unstable at times like leg is loose. Did donn 1ply today and tried another but she couldn't get leg on completely  this morning with another.    Pertinent History extensive past medical history including but not limited to prior CVA in 2018, type 2 diabetes, dyslipidemia, end-stage renal disease on hemodialysis, GERD, hypertension    Patient Stated Goals Pt wants to be able to walk with her leg and without AD.    Currently in Pain? No/denies                               [facility] Adult PT Treatment/Exercise - 09/11/21 1030       Ambulation/Gait   Ambulation/Gait Yes    Ambulation/Gait Assistance 5: Supervision    Ambulation/Gait Assistance Details Pt was instructed to stay up in walker for erect posture. Did note some instability at right knee.    Ambulation Distance (Feet) 230 Feet   115' x 1   Assistive device Rolling walker;Prosthesis    Gait Pattern Step-through pattern;Decreased step length - right;Decreased step length - left;Decreased dorsiflexion - left    Ambulation Surface Level;Indoor      Neuro Re-ed    Neuro Re-ed Details  At counter:  weight shifting side to side x 10 with UE support, side stepping 8' x 4, tapping under counter with left foot x 10 to facilitate right weight shift, step-ups on 4" step with RLE with 1 UE support x 5 CGA.      Prosthetics   Prosthetic Care Comments  Pt was instructed to try cutting a sock so that will have more thickness around knee to see if helps with instability. The cut sock will need to be under 2nd sock so doesn't roll. If that does not help with looseness around knee will need to contact prosthetist to add pads. Pt to increase to 3 hours 2x/day Monday as long as skin is doing well.    Current prosthetic wear tolerance (days/week)  4 days/week (non dialysis days)    Current prosthetic wear tolerance (#hours/day)  2 hours, 2x/day    Education Provided Proper wear schedule/adjustment;Correct ply sock adjustment    Person(s) Educated Patient;Spouse    Education Method Explanation    Education Method Verbalized understanding    Donning Prosthesis Supervision    Doffing Prosthesis Supervision                     PT Education - 09/11/21 1918     Education Details Started on initial HEP    Person(s) Educated Patient;Spouse    Methods Explanation;Demonstration;Handout    Comprehension Verbalized understanding              PT Short Term Goals - 08/29/21 1905       PT SHORT TERM GOAL #1   Title Pt will be independent with initial HEP for strengtheining and balance to decrease fall risk.    Baseline no current HEP    Time 4    Period Weeks    Status New    Target Date 09/26/21      PT SHORT TERM GOAL #2   Title Pt will be independent with prosthetic management for improved function.    Baseline currently needs assist and education    Time 4    Period Weeks    Status New    Target Date 09/26/21      PT SHORT TERM GOAL #3   Title Pt will be able to wear prosthesis for total of 8 hours/day for improved function on non dialysis days  Baseline currently not wearing  consistently    Time 4    Period Weeks    Status New    Target Date 09/26/21      PT SHORT TERM GOAL #4   Title Pt will decrease TUG from 37 sec to <30 sec for improved balance and decreased fall risk.    Baseline 08/28/21 37 sec    Time 4    Period Weeks    Status New    Target Date 09/26/21      PT SHORT TERM GOAL #5   Title Pt will ambulate >400' on level surfaces with RW and prosthesis mod I for improved household and short community distances.    Baseline 33' CGA with RW    Time 4    Period Weeks    Status New    Target Date 09/26/21      Additional Short Term Goals   Additional Short Term Goals Yes      PT SHORT TERM GOAL #6   Title Pt will ambulate up/down 3 steps with rails supervision to enter/exit home safely.    Baseline husband bringing her up/down in w/c    Time 4    Period Weeks    Status New    Target Date 09/26/21               PT Long Term Goals - 09/09/21 2013       PT LONG TERM GOAL #1   Title Pt will be able to tolerate wearing prosthesis all awake hours for improved function.    Baseline not wearing consistently at eval    Time 8    Period Weeks    Status New      PT LONG TERM GOAL #2   Title Pt will increase Berg Balance form 24 to >40/56 for improved balance and decreased fall risk.    Baseline 09/09/21 24/56    Time 8    Period Weeks    Status New      PT LONG TERM GOAL #3   Title Pt will increase gait speed from 0.24m/s to >0.6 m/s for improved household mobility.    Baseline 08/28/21 0.85m/s    Time 8    Period Weeks    Status New      PT LONG TERM GOAL #4   Title Pt will ambulate >600' with LRAD mod I on level indoor and outdoor surfaces.    Baseline 69' with RW CGA    Time 8    Period Weeks    Status New      PT LONG TERM GOAL #5   Title Pt will ambulate up/down curb and ramp with LRAD supervision for improved community access.    Baseline unable to perform    Time 8    Period Weeks    Status New                    Plan - 09/11/21 1919     Clinical Impression Statement Pt was having some issues with too much play at knee on prosthetic leg. Advised to try cut off sock as pt has a more bulbous residual limb. If does not work will need to contact prosthetist to see if pads should be added.    Personal Factors and Comorbidities Comorbidity 3+    Comorbidities CVA in 2018, type 2 diabetes, dyslipidemia, end-stage renal disease on hemodialysis, GERD, hypertension    Examination-Activity Limitations Transfers;Stand;Locomotion Level;Stairs  Examination-Participation Restrictions Community Activity;Cleaning    Stability/Clinical Decision Making Evolving/Moderate complexity    Rehab Potential Good    PT Frequency 2x / week   plus eval   PT Duration 8 weeks    PT Treatment/Interventions ADLs/Self Care Home Management;DME Instruction;Gait training;Stair training;Functional mobility training;Neuromuscular re-education;Balance training;Therapeutic exercise;Therapeutic activities;Patient/family education;Orthotic Fit/Training;Manual techniques;Passive range of motion;Energy conservation;Prosthetic Training    PT Next Visit Plan Monitor BP with activity. Prosthetic education. How did wear time increase go to 3 hours 2x/day? Did sock help around knee or has she called prosthetist if not?  Continue gait training and initial strengthening/balance HEP. Stair training. I asked her to bring her brace for left foot to check out.    Consulted and Agree with Plan of Care Patient;Family member/caregiver    Family Member Consulted husband, Juanda Crumble             Patient will benefit from skilled therapeutic intervention in order to improve the following deficits and impairments:  Abnormal gait, Decreased activity tolerance, Decreased balance, Decreased endurance, Decreased knowledge of use of DME, Decreased mobility, Decreased range of motion, Difficulty walking, Decreased strength, Impaired sensation, Prosthetic  Dependency  Visit Diagnosis: Other abnormalities of gait and mobility  Muscle weakness (generalized)  Unsteadiness on feet     Problem List Patient Active Problem List   Diagnosis Date Noted   S/P BKA (below knee amputation), right ([place]) 03/31/2021   Impaired mobility and ADLs 03/25/2021   CVA (cerebral vascular accident) ([place]) 03/04/2021   Pulmonary embolus ([place]) 03/04/2021   Charcot ankle, right 02/23/2021   Hx of cardiac arrest 11/12/2020   Abnormal CT of the chest 09/26/2020   Allergy, unspecified, initial encounter 09/22/2020   Anaphylactic shock, unspecified, initial encounter 09/22/2020   Fluid overload, unspecified 06/10/2020   Other mechanical complication of other cardiac and vascular devices and implants, initial encounter ([place]) 05/10/2020   Positive ANA (antinuclear antibody) 04/16/2020   ESRD on dialysis ([place])    Hemiplegia and hemiparesis following cerebral infarction affecting left non-dominant side ([place]) 12/29/2018   IBS (irritable bowel syndrome) 08/06/2017   Dyslipidemia associated with type 2 diabetes mellitus ([place]) 06/25/2016   Proliferative diabetic retinopathy ([place]) 09/29/2015   Chronic cough 10/04/2014   B12 deficiency 05/10/2014   Anemia in chronic kidney disease 04/26/2014   CKD (chronic kidney disease) stage 5, GFR less than 15 ml/min ([facility]) 04/26/2014   Secondary hyperparathyroidism of renal origin ([place]) 11/27/2013   Posterior subcapsular cataract, bilateral 10/18/2013   Diabetic neuropathy, painful ([place]) 07/13/2013   Diabetes mellitus, type II, insulin dependent ([place]), on CGM, with end organ damage: renal, neuropathy, gastroparesis, retinopathy 02/19/2011   Hypertension associated with diabetes ([place]) 02/19/2011   Gastroparesis due to DM ([place]) 02/19/2011    Electa Sniff, PT, DPT, NCS 09/11/2021, 7:22 PM  [facility] [address] [place] [place], [place], [address] Phone: [phone]    Fax:  [phone]  Name: Heather [name] MRN: [id number] Date of Birth: 13-Aug-1971

## 2021-09-11 NOTE — Patient Instructions (Signed)
Access Code: [email or web address] URL: [email or web address] Date: 09/11/2021 Prepared by: Cherly Anderson  Exercises Side to Side Weight Shift with Counter Support - 1 x daily - 5 x weekly - 2 sets - 10 reps Side Stepping with Counter Support - 1 x daily - 5 x weekly - 1 sets - 3-4 reps Alternating Step Taps with Counter Support - 1 x daily - 5 x weekly - 2 sets - 10 reps

## 2021-09-14 ENCOUNTER — Other Ambulatory Visit: Payer: Self-pay | Admitting: Emergency Medicine

## 2021-09-14 LAB — SARS CORONAVIRUS 2 (TAT 6-24 HRS): SARS Coronavirus 2: NEGATIVE

## 2021-09-16 ENCOUNTER — Ambulatory Visit: Payer: Medicare Other

## 2021-09-16 ENCOUNTER — Other Ambulatory Visit: Payer: Self-pay

## 2021-09-16 VITALS — BP 180/105

## 2021-09-16 DIAGNOSIS — M6281 Muscle weakness (generalized): Secondary | ICD-10-CM

## 2021-09-16 NOTE — Therapy (Signed)
[facility] [address] [place], [place], [address] Phone: [phone]   Fax:  [phone]  Physical Therapy Treatment-arrived no charge  Patient Details  Name: Heather [name] MRN: [id number] Date of Birth: [birth year] Referring Provider (PT): Madelon Lips   Encounter Date: 09/16/2021   PT End of Session - 09/16/21 1330     Visit Number 3    Number of Visits 17    Date for PT Re-Evaluation 10/24/21    Authorization Type cigna    PT Start Time 1316   arrived no charge   PT Stop Time 1325    PT Time Calculation (min) 9 min    Equipment Utilized During Treatment Gait belt    Activity Tolerance Patient tolerated treatment well    Behavior During Therapy [facility] for tasks assessed/performed             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[place]), nonhemorrhagic, inferior right cerebellum 12/03/2017   left side weakness in leg   Cyst of left ovary 01/12/2018   Diabetic neuropathy, painful ([place]), on low dose Gabapentin 07/13/2013   DM (diabetes mellitus), type 2, uncontrolled, with renal complications ([place]) 77/41/2878   DM2 (diabetes mellitus, type 2) ([place])    Dysfunction of left eustachian tube, with pusatile tinnitus 11/24/2017   Dyslipidemia associated with type 2 diabetes mellitus ([place]) 06/25/2016   ESRD (end stage renal disease) on dialysis [facility])    On hemodialysis in July 2019 via [facility] then switched to [facility] in Nov 2019.    ESRD with anemia (HCC)    Fibromyalgia    Gastroparesis due to DM    GERD (gastroesophageal reflux disease)    Headache    Hypertension associated with diabetes ([place]) 02/19/2011   IBS (irritable bowel syndrome)    Left-sided weakness 12/10/2017   .   Leiomyoma of uterus    Pancreatitis     PE (pulmonary thromboembolism) ([place]) 11/04/2019   Pneumonia    Proliferative diabetic retinopathy ([place]) 09/29/2015   Pronation deformity of both feet 06/14/2014   Reactive depression 12/10/2017   .   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[place]) 02/17/2018   Thromboembolism ([place]) 04/05/2018   Upper airway cough syndrome, with recs to stay off ACE and take Pepcid q hs 11/15/2016    Past Surgical History:  Procedure Laterality Date   A/V FISTULAGRAM Right 03/03/2020   Procedure: Venogram;  Surgeon: Waynetta Sandy, MD;  Location: [place] [facility];  Service: Cardiovascular;  Laterality: Right;   ARTERY REPAIR Right 02/17/2018   Procedure: EXPLORATION OF RIGHT BRACHIAL ARTERY;  Surgeon: Conrad Berrien, MD;  Location: [place];  Service: Vascular;  Laterality: Right;   ARTERY REPAIR Right 02/17/2018   Procedure: BRACHIAL ARTERY EXPLORATION AND TRHOMBECTOMY;  Surgeon: Conrad [place]. [place], MD;  Location: [facility];  Service: Vascular;  Laterality: Right;   AV FISTULA PLACEMENT Left 05/09/2017   Procedure: INSERTION OF ARTERIOVENOUS (AV) GRAFT ARM (ARTEGRAFT);  Surgeon: Conrad [place], MD;  Location: [facility] OR;  Service: Vascular;  Laterality: Left;   AV FISTULA PLACEMENT Right 02/17/2018   Procedure: INSERTION OF ARTERIOVENOUS (AV) GORE-TEX GRAFT ARM RIGHT UPPER ARM;  Surgeon: Conrad Rawson, MD;  Location: [facility]  OR;  Service: Vascular;  Laterality: Right;   AV FISTULA PLACEMENT Left 10/10/2019   Procedure: INSERTION OF LEFT ARTERIOVENOUS (AV) ARTEGRAFT GRAFT ARM;  Surgeon: Waynetta Sandy, MD;  Location: [place];  Service: Vascular;  Laterality: Left;   AV FISTULA PLACEMENT Right 03/19/2020   Procedure: INSERTION OF ARTERIOVENOUS (AV) GORE-TEX GRAFT IN RIGHT ARM AND VI[place]N STENT IN RIGHT AXILLARY ARTERY;  Surgeon: Waynetta Sandy, MD;  Location: [place];  Service: Vascular;  Laterality: Right;    [place] Left 08/31/2016   Procedure: BASILIC VEIN TRANSPOSITION FIRST STAGE;  Surgeon: Conrad [place], MD;  Location: [name];  Service: Vascular;  Laterality: Left;   CENTRAL VENOUS CATHETER INSERTION Left 07/24/2018   Procedure: INSERTION CENTRAL LINE ADULT;  Surgeon: Conrad [place], MD;  Location: [place];  Service: Vascular;  Laterality: Left;   EYE SURGERY     secondary to diabetic retinopathy    FOOT SURGERY Right    t"ook bone out- maybe hammer toe"   INSERTION OF DIALYSIS CATHETER Left 07/24/2018   Procedure: INSERTION OF TUNNELED DIALYSIS CATHETER;  Surgeon: Conrad [place], MD;  Location: [place];  Service: Vascular;  Laterality: Left;   IR FLUORO GUIDE CV LINE RIGHT  07/21/2018   IR FLUORO GUIDE CV LINE RIGHT  06/01/2019   IR FLUORO GUIDE CV LINE RIGHT  05/05/2020   IR REMOVAL TUN CV CATH W/O FL  01/23/2021   IR US GUIDE VASC ACCESS RIGHT  07/21/2018   IR US GUIDE VASC ACCESS RIGHT  06/01/2019   LIGATION ARTERIOVENOUS GORTEX GRAFT Right 03/20/2020   Procedure: LIGATION ARM ARTERIOVENOUS GORTEX GRAFT With Patch Angioplasty, Thrombectomy;  Surgeon: Waynetta Sandy, MD;  Location: [place];  Service: Vascular;  Laterality: Right;   LIGATION OF ARTERIOVENOUS  FISTULA Left 05/09/2017   Procedure: LIGATION OF ARTERIOVENOUS  FISTULA;  Surgeon: Conrad [place], MD;  Location: [place];  Service: Vascular;  Laterality: Left;   LOOP RECORDER INSERTION N/A 12/05/2017   Procedure: LOOP RECORDER INSERTION;  Surgeon: Evans Lance, MD;  Location: [place] [facility];  Service: Cardiovascular;  Laterality: N/A;   MYOMECTOMY     REMOVAL OF GRAFT Right 02/17/2018   Procedure: REMOVAL OF RIGHT UPPER ARM ARTERIOVENOUS GRAFT;  Surgeon: Conrad [place], MD;  Location: [place];  Service: Vascular;  Laterality: Right;   REVISION OF ARTERIOVENOUS GORETEX GRAFT Left 07/24/2018   Procedure: REDO ARTERIOVENOUS GORETEX GRAFT;  Surgeon: Conrad [place], MD;  Location: [place];  Service: Vascular;  Laterality: Left;    TEE WITHOUT CARDIOVERSION N/A 12/05/2017   Procedure: TRANSESOPHAGEAL ECHOCARDIOGRAM (TEE);  Surgeon: Sanda Klein, MD;  Location: [place];  Service: Cardiovascular;  Laterality: N/A;   UPPER EXTREMITY VENOGRAPHY Left 07/13/2018   Procedure: UPPER EXTREMITY VENOGRAPHY - Central & Left Arm;  Surgeon: Conrad Anthony, MD;  Location: [facility];  Service: Cardiovascular;  Laterality: Left;   UPPER EXTREMITY VENOGRAPHY Bilateral 09/10/2019   Procedure: UPPER EXTREMITY VENOGRAPHY;  Surgeon: Waynetta Sandy, MD;  Location: [place] [facility];  Service: Cardiovascular;  Laterality: Bilateral;   UTERINE FIBROID SURGERY     VITRECTOMY Bilateral     Vitals:   09/16/21 1330  BP: (!) 180/105     Subjective Assessment - 09/16/21 1331     Subjective Pt reports that she is not feeling well and having pain from chest port having to be put in yesterday as her fistula closed up. They were not able to  do dialysis yesterday due to this.    Pertinent History extensive past medical history including but not limited to prior CVA in 2018, type 2 diabetes, dyslipidemia, end-stage renal disease on hemodialysis, GERD, hypertension    Patient Stated Goals Pt wants to be able to walk with her leg and without AD.                                          PT Short Term Goals - 08/29/21 1905       PT SHORT TERM GOAL #1   Title Pt will be independent with initial HEP for strengtheining and balance to decrease fall risk.    Baseline no current HEP    Time 4    Period Weeks    Status New    Target Date 09/26/21      PT SHORT TERM GOAL #2   Title Pt will be independent with prosthetic management for improved function.    Baseline currently needs assist and education    Time 4    Period Weeks    Status New    Target Date 09/26/21      PT SHORT TERM GOAL #3   Title Pt will be able to wear prosthesis for total of 8 hours/day for improved function on non  dialysis days    Baseline currently not wearing consistently    Time 4    Period Weeks    Status New    Target Date 09/26/21      PT SHORT TERM GOAL #4   Title Pt will decrease TUG from 37 sec to <30 sec for improved balance and decreased fall risk.    Baseline 08/28/21 37 sec    Time 4    Period Weeks    Status New    Target Date 09/26/21      PT SHORT TERM GOAL #5   Title Pt will ambulate >400' on level surfaces with RW and prosthesis mod I for improved household and short community distances.    Baseline 59' CGA with RW    Time 4    Period Weeks    Status New    Target Date 09/26/21      Additional Short Term Goals   Additional Short Term Goals Yes      PT SHORT TERM GOAL #6   Title Pt will ambulate up/down 3 steps with rails supervision to enter/exit home safely.    Baseline husband bringing her up/down in w/c    Time 4    Period Weeks    Status New    Target Date 09/26/21               PT Long Term Goals - 09/09/21 2013       PT LONG TERM GOAL #1   Title Pt will be able to tolerate wearing prosthesis all awake hours for improved function.    Baseline not wearing consistently at eval    Time 8    Period Weeks    Status New      PT LONG TERM GOAL #2   Title Pt will increase [place] Balance form 24 to >40/56 for improved balance and decreased fall risk.    Baseline 09/09/21 24/56    Time 8    Period Weeks    Status New      PT LONG TERM GOAL #3  Title Pt will increase gait speed from 0.61m/s to >0.6 m/s for improved household mobility.    Baseline 08/28/21 0.22m/s    Time 8    Period Weeks    Status New      PT LONG TERM GOAL #4   Title Pt will ambulate >600' with LRAD mod I on level indoor and outdoor surfaces.    Baseline 64' with RW CGA    Time 8    Period Weeks    Status New      PT LONG TERM GOAL #5   Title Pt will ambulate up/down curb and ramp with LRAD supervision for improved community access.    Baseline unable to perform    Time 8     Period Weeks    Status New                   Plan - 09/16/21 1332     Clinical Impression Statement PT treatment witheld due to elevated BP today as pt did not have dialysis yesterday as had to have chest port put in with arm fistula closing up.    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 Prosthetic education. How did wear time increase go to 3 hours 2x/day? Did sock help around knee or has she called prosthetist if not?  Continue gait training and initial strengthening/balance HEP. Stair training. I asked her to bring her brace for left foot to check out.    Consulted and Agree with Plan of Care Patient;Family member/caregiver    Family Member Consulted husband, Juanda Crumble             Patient will benefit from skilled therapeutic intervention in order to improve the following deficits and impairments:  Abnormal gait, Decreased activity tolerance, Decreased balance, Decreased endurance, Decreased knowledge of use of DME, Decreased mobility, Decreased range of motion, Difficulty walking, Decreased strength, Impaired sensation, Prosthetic Dependency  Visit  Diagnosis: Muscle weakness (generalized)     Problem List Patient Active Problem List   Diagnosis Date Noted   S/P BKA (below knee amputation), right ([place]) 03/31/2021   Impaired mobility and ADLs 03/25/2021   CVA (cerebral vascular accident) ([place]) 03/04/2021   Pulmonary embolus ([place]) 03/04/2021   Charcot ankle, right 02/23/2021   Hx of cardiac arrest 11/12/2020   Abnormal CT of the chest 09/26/2020   Allergy, unspecified, initial encounter 09/22/2020   Anaphylactic shock, unspecified, initial encounter 09/22/2020   Fluid overload, unspecified 06/10/2020   Other mechanical complication of other cardiac and vascular devices and implants, initial encounter ([place]) 05/10/2020   Positive ANA (antinuclear antibody) 04/16/2020   ESRD on dialysis [facility])    Hemiplegia and hemiparesis following cerebral infarction affecting left non-dominant side ([place]) 12/29/2018   IBS (irritable bowel syndrome) 08/06/2017   Dyslipidemia associated with type 2 diabetes mellitus ([place]) 06/25/2016   Proliferative diabetic retinopathy ([place]) 09/29/2015   Chronic cough 10/04/2014  B12 deficiency 05/10/2014   Anemia in chronic kidney disease 04/26/2014   CKD (chronic kidney disease) stage 5, GFR less than 15 ml/min (HCC) 04/26/2014   Secondary hyperparathyroidism of renal origin ([place]) 11/27/2013   Posterior subcapsular cataract, bilateral 10/18/2013   Diabetic neuropathy, painful ([place]) 07/13/2013   Diabetes mellitus, type II, insulin dependent ([place]), on CGM, with end organ damage: renal, neuropathy, gastroparesis, retinopathy 02/19/2011   Hypertension associated with diabetes ([place]) 02/19/2011   Gastroparesis due to DM ([place]) 02/19/2011    Electa Sniff, PT, DPT, NCS 09/16/2021, 1:33 PM  [facility] [address] [place], [place], [address] Phone: [phone]   Fax:  [phone]  Name: Heather [name] MRN: [id number] Date of Birth:  05-11-1971

## 2021-09-17 ENCOUNTER — Ambulatory Visit (HOSPITAL_COMMUNITY): Payer: Medicare Other | Admitting: Certified Registered Nurse Anesthetist

## 2021-09-17 ENCOUNTER — Ambulatory Visit (HOSPITAL_COMMUNITY)
Admission: RE | Admit: 2021-09-17 | Discharge: 2021-09-17 | Disposition: A | Discharge status: Home / Self Care | Payer: Medicare Other | Attending: Emergency Medicine | Admitting: Emergency Medicine

## 2021-09-17 ENCOUNTER — Encounter (HOSPITAL_COMMUNITY)
Admission: RE | Disposition: A | Discharge status: Home / Self Care | Payer: Self-pay | Source: Home / Self Care | Attending: Emergency Medicine

## 2021-09-17 ENCOUNTER — Encounter (HOSPITAL_COMMUNITY): Payer: Self-pay | Admitting: Emergency Medicine

## 2021-09-17 DIAGNOSIS — E113599 Type 2 diabetes mellitus with proliferative diabetic retinopathy without macular edema, unspecified eye: Secondary | ICD-10-CM | POA: Diagnosis not present

## 2021-09-17 DIAGNOSIS — Z794 Long term (current) use of insulin: Secondary | ICD-10-CM | POA: Insufficient documentation

## 2021-09-17 DIAGNOSIS — Z8674 Personal history of sudden cardiac arrest: Secondary | ICD-10-CM | POA: Diagnosis not present

## 2021-09-17 DIAGNOSIS — I679 Cerebrovascular disease, unspecified: Secondary | ICD-10-CM | POA: Insufficient documentation

## 2021-09-17 DIAGNOSIS — Z992 Dependence on renal dialysis: Secondary | ICD-10-CM | POA: Diagnosis not present

## 2021-09-17 DIAGNOSIS — K219 Gastro-esophageal reflux disease without esophagitis: Secondary | ICD-10-CM | POA: Diagnosis not present

## 2021-09-17 DIAGNOSIS — Z79899 Other long term (current) drug therapy: Secondary | ICD-10-CM | POA: Diagnosis not present

## 2021-09-17 DIAGNOSIS — E1122 Type 2 diabetes mellitus with diabetic chronic kidney disease: Secondary | ICD-10-CM | POA: Diagnosis not present

## 2021-09-17 DIAGNOSIS — E1140 Type 2 diabetes mellitus with diabetic neuropathy, unspecified: Secondary | ICD-10-CM | POA: Diagnosis not present

## 2021-09-17 DIAGNOSIS — I69354 Hemiplegia and hemiparesis following cerebral infarction affecting left non-dominant side: Secondary | ICD-10-CM | POA: Insufficient documentation

## 2021-09-17 DIAGNOSIS — I120 Hypertensive chronic kidney disease with stage 5 chronic kidney disease or end stage renal disease: Secondary | ICD-10-CM | POA: Insufficient documentation

## 2021-09-17 DIAGNOSIS — R9389 Abnormal findings on diagnostic imaging of other specified body structures: Secondary | ICD-10-CM | POA: Diagnosis not present

## 2021-09-17 DIAGNOSIS — R059 Cough, unspecified: Secondary | ICD-10-CM | POA: Diagnosis present

## 2021-09-17 DIAGNOSIS — N186 End stage renal disease: Secondary | ICD-10-CM | POA: Diagnosis not present

## 2021-09-17 DIAGNOSIS — R918 Other nonspecific abnormal finding of lung field: Secondary | ICD-10-CM | POA: Diagnosis not present

## 2021-09-17 DIAGNOSIS — Z886 Allergy status to analgesic agent status: Secondary | ICD-10-CM | POA: Insufficient documentation

## 2021-09-17 DIAGNOSIS — Z885 Allergy status to narcotic agent status: Secondary | ICD-10-CM | POA: Insufficient documentation

## 2021-09-17 DIAGNOSIS — Z91041 Radiographic dye allergy status: Secondary | ICD-10-CM | POA: Diagnosis not present

## 2021-09-17 DIAGNOSIS — R053 Chronic cough: Secondary | ICD-10-CM | POA: Diagnosis not present

## 2021-09-17 HISTORY — PX: BRONCHIAL WASHINGS: SHX5105

## 2021-09-17 HISTORY — PX: VIDEO BRONCHOSCOPY: SHX5072

## 2021-09-17 SURGERY — VIDEO BRONCHOSCOPY WITHOUT FLUORO
Anesthesia: General

## 2021-09-17 MED ORDER — MIDAZOLAM HCL 5 MG/5ML IJ SOLN
INTRAMUSCULAR | Status: DC | PRN
  Administered 2021-09-17: 2 mg via INTRAVENOUS

## 2021-09-17 MED ORDER — SUGAMMADEX SODIUM 200 MG/2ML IV SOLN
INTRAVENOUS | Status: DC | PRN
  Administered 2021-09-17: 400 mg via INTRAVENOUS

## 2021-09-17 MED ORDER — FENTANYL CITRATE (PF) 100 MCG/2ML IJ SOLN
INTRAMUSCULAR | Status: AC
  Filled 2021-09-17: qty 2

## 2021-09-17 MED ORDER — NYSTATIN-TRIAMCINOLONE 100000-0.1 UNIT/GM-% EX OINT: 1.0000 "application " | TOPICAL_OINTMENT | Freq: Every day | CUTANEOUS | Status: DC | PRN

## 2021-09-17 MED ORDER — MIDAZOLAM HCL 2 MG/2ML IJ SOLN
INTRAMUSCULAR | Status: AC
  Filled 2021-09-17: qty 2

## 2021-09-17 MED ORDER — DEXAMETHASONE SODIUM PHOSPHATE 4 MG/ML IJ SOLN
INTRAMUSCULAR | Status: DC | PRN
  Administered 2021-09-17: 10 mg via INTRAVENOUS

## 2021-09-17 MED ORDER — SODIUM CHLORIDE 0.9 % IV SOLN: INTRAVENOUS | Status: DC | PRN

## 2021-09-17 MED ORDER — LIDOCAINE 2% (20 MG/ML) 5 ML SYRINGE
INTRAMUSCULAR | Status: DC | PRN
  Administered 2021-09-17: 60 mg via INTRAVENOUS

## 2021-09-17 MED ORDER — GABAPENTIN 300 MG PO CAPS: 300.0000 mg | ORAL_CAPSULE | Freq: Three times a day (TID) | ORAL | Status: DC

## 2021-09-17 MED ORDER — ONDANSETRON HCL 4 MG/2ML IJ SOLN
INTRAMUSCULAR | Status: DC | PRN
  Administered 2021-09-17: 4 mg via INTRAVENOUS

## 2021-09-17 MED ORDER — PROPOFOL 10 MG/ML IV BOLUS
INTRAVENOUS | Status: DC | PRN
  Administered 2021-09-17: 200 mg via INTRAVENOUS

## 2021-09-17 MED ORDER — MIDODRINE HCL 5 MG PO TABS: 5.0000 mg | ORAL_TABLET | ORAL | Status: DC

## 2021-09-17 MED ORDER — ROCURONIUM BROMIDE 10 MG/ML (PF) SYRINGE
PREFILLED_SYRINGE | INTRAVENOUS | Status: DC | PRN
  Administered 2021-09-17: 60 mg via INTRAVENOUS

## 2021-09-17 MED ORDER — BENZONATATE 100 MG PO CAPS: 100.0000 mg | ORAL_CAPSULE | Freq: Two times a day (BID) | ORAL | Status: DC | PRN

## 2021-09-17 MED ORDER — FENTANYL CITRATE (PF) 100 MCG/2ML IJ SOLN
INTRAMUSCULAR | Status: DC | PRN
  Administered 2021-09-17: 50 ug via INTRAVENOUS

## 2021-09-17 MED ORDER — PROPOFOL 500 MG/50ML IV EMUL
INTRAVENOUS | Status: AC
  Filled 2021-09-17: qty 50

## 2021-09-17 MED ORDER — PHENYLEPHRINE 40 MCG/ML (10ML) SYRINGE FOR IV PUSH (FOR BLOOD PRESSURE SUPPORT)
PREFILLED_SYRINGE | INTRAVENOUS | Status: DC | PRN
  Administered 2021-09-17: 120 ug via INTRAVENOUS

## 2021-09-17 NOTE — Anesthesia Procedure Notes (Signed)
Procedure Name: Intubation Date/Time: 09/17/2021 1:35 PM Performed by: Claudia Desanctis, CRNA Pre-anesthesia Checklist: Patient identified, Emergency Drugs available, Suction available and Patient being monitored Patient Re-evaluated:Patient Re-evaluated prior to induction Oxygen Delivery Method: Circle system utilized Preoxygenation: Pre-oxygenation with 100% oxygen Induction Type: IV induction Ventilation: Mask ventilation without difficulty Laryngoscope Size: 2 and Miller Grade View: Grade I Tube type: Oral Tube size: 8.5 mm Number of attempts: 1 Airway Equipment and Method: Stylet Placement Confirmation: ETT inserted through vocal cords under direct vision, positive ETCO2 and breath sounds checked- equal and bilateral Tube secured with: Tape Dental Injury: Teeth and Oropharynx as per pre-operative assessment

## 2021-09-17 NOTE — Anesthesia Preprocedure Evaluation (Addendum)
Anesthesia Evaluation  Patient identified by MRN, date of birth, ID band Patient awake    Reviewed: Allergy & Precautions, NPO status , Patient's Chart, lab work & pertinent test results  Airway Mallampati: II  TM Distance: >3 FB Neck ROM: Full    Dental no notable dental hx. (+) Missing, Dental Advisory Given,    Pulmonary PE (2020) Abnormal chest CT: Mild biapical tree-in-bud opacity, nonspecific but possibly indicating endobronchial infection.   Pulmonary exam normal breath sounds clear to auscultation       Cardiovascular hypertension, Pt. on medications +CHF (grade 1 diastolic dysfunction, normla LVEF)  Normal cardiovascular exam Rhythm:Regular Rate:Normal  Echo 2021: 1. Left ventricular ejection fraction, by estimation, is 65 to 70%. The  left ventricle has hyperdynamic function. The left ventricle has no  regional wall motion abnormalities. There is mild left ventricular  hypertrophy. Left ventricular diastolic  parameters are consistent with Grade I diastolic dysfunction (impaired  relaxation). Elevated left atrial pressure.  2. Right ventricular systolic function is normal. The right ventricular  size is normal.  3. The mitral valve is normal in structure. No evidence of mitral valve  regurgitation. No evidence of mitral stenosis.  4. The aortic valve is normal in structure. Aortic valve regurgitation is  not visualized. No aortic stenosis is present.   Stress test 2016 neg   Neuro/Psych  Headaches, PSYCHIATRIC DISORDERS Depression CVA (2018, residual L sided weakness very mild), Residual Symptoms    GI/Hepatic Neg liver ROS, GERD  Medicated and Controlled,Chronic constipaiton, gastroparesis, IBS   Endo/Other  diabetes, Well Controlled, Type 2, Insulin Dependenta1c 7.1  Renal/GU ESRF and DialysisRenal disease  negative genitourinary   Musculoskeletal  (+) Fibromyalgia -  Abdominal   Peds   Hematology negative hematology ROS (+)   Anesthesia Other Findings   Reproductive/Obstetrics negative OB ROS                            Anesthesia Physical Anesthesia Plan  ASA: 3  Anesthesia Plan: General   Post-op Pain Management:    Induction: Intravenous  PONV Risk Score and Plan: 3 and Midazolam, Ondansetron, Dexamethasone and Treatment may vary due to age or medical condition  Airway Management Planned: Oral ETT  Additional Equipment: None  Intra-op Plan:   Post-operative Plan: Extubation in OR  Informed Consent: I have reviewed the patients History and Physical, chart, labs and discussed the procedure including the risks, benefits and alternatives for the proposed anesthesia with the patient or authorized representative who has indicated his/her understanding and acceptance.     Dental advisory given  Plan Discussed with: CRNA  Anesthesia Plan Comments:        Anesthesia Quick Evaluation

## 2021-09-17 NOTE — Interval H&P Note (Signed)
History and Physical Interval Note:  09/17/2021 1:02 PM  Heather [name]  has presented today for surgery, with the diagnosis of ABNORMAL CT OF CHEST.  The various methods of treatment have been discussed with the patient and family. After consideration of risks, benefits and other options for treatment, the patient has consented to  Procedure(s): VIDEO BRONCHOSCOPY WITHOUT FLUORO (N/A) as a surgical intervention.  The patient's history has been reviewed, patient examined, no change in status, stable for surgery.  I have reviewed the patient's chart and labs.  Questions were answered to the patient's satisfaction.     Collene Gobble

## 2021-09-17 NOTE — Op Note (Signed)
Video Bronchoscopy Procedure Note  Date of Operation: 09/17/2021  Pre-op Diagnosis: Cough, bilateral nodular pulmonary infiltrates  Post-op Diagnosis: Same  Surgeon: Baltazar Apo  Assistants: none  Anesthesia: General  Operation: Flexible video fiberoptic bronchoscopy and BAL  Estimated Blood Loss: 0   Complications: none noted  Indications and History: Heather [name] is 50 y.o. with history of cough.  A CT scan of her chest shows bilateral upper lobe predominant micronodular infiltrates suggestive of possible atypical mycobacterial disease.  Recommendation was to perform video fiberoptic bronchoscopy with washings. The risks, benefits, complications, treatment options and expected outcomes were discussed with the patient.  The possibilities of pneumothorax, pneumonia, reaction to medication, pulmonary aspiration, perforation of a viscus, bleeding, failure to diagnose a condition and creating a complication requiring transfusion or operation were discussed with the patient who freely signed the consent.    Description of Procedure: The patient was seen in the Preoperative Area, was examined and was deemed appropriate to proceed.  The patient was taken to [facility] endoscopy room 2, identified as Heather [name] and the procedure verified as Flexible Video Fiberoptic Bronchoscopy.  A Time Out was held and the above information confirmed.   General anesthesia was initiated and the patient was intubated the video fiberoptic bronchoscope was introduced via the ET tube and a general inspection was performed which showed normal trachea, normal main carina. The R sided airways were inspected and showed normal RUL, BI, RML and RLL. The L side was then inspected. The LLL, Lingular and LUL airways were normal.   Bronchoalveolar lavage was performed in the left upper lobe to be sent for culture with 60 cc normal saline instilled and 35 cc returned.    At the end of the case the  bronchoscope was positioned at the distal end of the ET tube so the glottis and posterior pharynx could be visualized at the time of extubation.  There were no polyps or vocal cord lesions noted.  The patient tolerated the procedure well. There were no obvious complications.   Samples: 1.  Bronchoalveolar lavage from the left upper lobe for cultures  Plans:  We will review the microbiology results with the patient when they become available.  Outpatient followup will be with Dr Lamonte Sakai.    Baltazar Apo, MD, PhD 09/17/2021, 2:07 PM [facility] [phone] or if no answer [phone]

## 2021-09-17 NOTE — Anesthesia Postprocedure Evaluation (Signed)
Anesthesia Post Note  Patient: Heather [name]  Procedure(s) Performed: [place] WITHOUT FLUORO BRONCHIAL WASHINGS     Patient location during evaluation: PACU Anesthesia Type: General Level of consciousness: awake and alert, oriented and patient cooperative Pain management: pain level controlled Vital Signs Assessment: post-procedure vital signs reviewed and stable Respiratory status: spontaneous breathing, nonlabored ventilation and respiratory function stable Cardiovascular status: blood pressure returned to baseline and stable Postop Assessment: no apparent nausea or vomiting Anesthetic complications: no   No notable events documented.  Last Vitals:  Vitals:   09/17/21 1412 09/17/21 1421  BP: (!) 118/95 107/85  Pulse: (!) 101 98  Resp: 20 12  Temp: 37.1 C   SpO2: 100% 100%    Last Pain:  Vitals:   09/17/21 1412  TempSrc: Axillary  PainSc: 0-No pain                 Pervis Hocking

## 2021-09-17 NOTE — Discharge Instructions (Signed)
Flexible Bronchoscopy, Care After This sheet gives you information about how to care for yourself after your test. Your doctor may also give you more specific instructions. If you have problems or questions, contact your doctor. Follow these instructions at home: Eating and drinking Do not eat or drink anything (not even water) for 2 hours after your test, or until your numbing medicine (local anesthetic) wears off. When your numbness is gone and your cough and gag reflexes have come back, you may: Eat only soft foods. Slowly drink liquids. The day after the test, go back to your normal diet. Driving Do not drive for 24 hours if you were given a medicine to help you relax (sedative). Do not drive or use heavy machinery while taking prescription pain medicine. General instructions  Take over-the-counter and prescription medicines only as told by your doctor. Return to your normal activities as told. Ask what activities are safe for you. Do not use any products that have nicotine or tobacco in them. This includes cigarettes and e-cigarettes. If you need help quitting, ask your doctor. Keep all follow-up visits as told by your doctor. This is important. It is very important if you had a tissue sample (biopsy) taken. Get help right away if: You have shortness of breath that gets worse. You get light-headed. You feel like you are going to pass out (faint). You have chest pain. You cough up: More than a little blood. More blood than before. Summary Do not eat or drink anything (not even water) for 2 hours after your test, or until your numbing medicine wears off. Do not use cigarettes. Do not use e-cigarettes. Get help right away if you have chest pain.  Please call our office for any questions or concerns.  [phone].  This information is not intended to replace advice given to you by your health care provider. Make sure you discuss any questions you have with your health care  provider. Document Released: 10/10/2009 Document Revised: 11/25/2017 Document Reviewed: 12/31/2016 Elsevier Patient Education  2020 [employer].

## 2021-09-17 NOTE — Transfer of Care (Signed)
Immediate Anesthesia Transfer of Care Note  Patient: Heather [name]  Procedure(s) Performed: VIDEO BRONCHOSCOPY WITHOUT FLUORO BRONCHIAL WASHINGS  Patient Location: Endoscopy Unit  Anesthesia Type:General  Level of Consciousness: awake and patient cooperative  Airway & Oxygen Therapy: Patient Spontanous Breathing and Patient connected to face mask  Post-op Assessment: Report given to RN and Post -op Vital signs reviewed and stable  Post vital signs: Reviewed and stable  Last Vitals:  Vitals Value Taken Time  BP    Temp    Pulse 101 09/17/21 1411  Resp 20 09/17/21 1411  SpO2 100 % 09/17/21 1411  Vitals shown include unvalidated device data.  Last Pain:  Vitals:   09/17/21 1211  TempSrc: Oral  PainSc: 6          Complications: No notable events documented.

## 2021-09-18 ENCOUNTER — Other Ambulatory Visit: Payer: Self-pay

## 2021-09-18 ENCOUNTER — Encounter (HOSPITAL_COMMUNITY): Payer: Self-pay | Admitting: Emergency Medicine

## 2021-09-18 ENCOUNTER — Ambulatory Visit: Payer: Medicare Other

## 2021-09-18 VITALS — BP 132/86

## 2021-09-18 DIAGNOSIS — R2689 Other abnormalities of gait and mobility: Secondary | ICD-10-CM

## 2021-09-18 DIAGNOSIS — R2681 Unsteadiness on feet: Secondary | ICD-10-CM

## 2021-09-18 DIAGNOSIS — M6281 Muscle weakness (generalized): Secondary | ICD-10-CM

## 2021-09-18 NOTE — Therapy (Addendum)
[facility] [address] [place], [place], [address] Phone: [phone]   Fax:  [phone] PHYSICAL THERAPY DISCHARGE SUMMARY  Visits from Start of Care: 4  Current functional level related to goals / functional outcomes: Unable to assess   Remaining deficits: Unable to assess   Education / Equipment: Unable to assess   Patient agrees to discharge. Patient goals were not met. Patient is being discharged due to not returning since the last visit.  Signing therapist has never treated or seen patient, but is discharging patient on behalf of last treating therapist secondary to long standing open episode that needs closing. 1:52 PM, 09/27/22 Jerene Pitch, DPT Physical Therapy with [facility]   Physical Therapy Treatment  Patient Details  Name: Heather [name] MRN: [id number] Date of Birth: 08/23/1971 No data recorded   Encounter Date: 09/18/2021     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[place]), nonhemorrhagic, inferior right cerebellum 12/03/2017   left side weakness in leg   Cyst of left ovary 01/12/2018   Diabetic neuropathy, painful ([place]), on low dose Gabapentin 07/13/2013   DM (diabetes mellitus), type 2, uncontrolled, with renal complications 79/01/4096   DM2 (diabetes mellitus, type 2) ([place])    Dysfunction of left eustachian tube, with pusatile tinnitus 11/24/2017   Dyslipidemia associated with type 2 diabetes mellitus ([place]) 06/25/2016   ESRD (end stage renal disease) on dialysis [facility])    On hemodialysis in July 2019 via [facility] then switched to [facility] in Nov 2019.    ESRD with anemia (HCC)    Fibromyalgia    Gastroparesis due to DM    GERD (gastroesophageal reflux disease)    Headache    Hypertension  associated with diabetes ([place]) 02/19/2011   IBS (irritable bowel syndrome)    Left-sided weakness 12/10/2017   .   Leiomyoma of uterus    Pancreatitis    PE (pulmonary thromboembolism) ([place]) 11/04/2019   Pneumonia    Proliferative diabetic retinopathy ([place]) 09/29/2015   Pronation deformity of both feet 06/14/2014   Reactive depression 12/10/2017   .   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[place]) 02/17/2018   Thromboembolism ([place]) 04/05/2018   Upper airway cough syndrome, with recs to stay off ACE and take Pepcid q hs 11/15/2016    Past Surgical History:  Procedure Laterality Date   A/V FISTULAGRAM Right 03/03/2020   Procedure: Venogram;  Surgeon: Waynetta Sandy, MD;  Location: [facility];  Service: Cardiovascular;  Laterality: Right;   ARTERY REPAIR Right 02/17/2018   Procedure: EXPLORATION OF RIGHT BRACHIAL ARTERY;  Surgeon: Conrad Chester, MD;  Location: [facility];  Service: Vascular;  Laterality: Right;   ARTERY REPAIR Right 02/17/2018   Procedure: BRACHIAL ARTERY EXPLORATION AND TRHOMBECTOMY;  Surgeon: Conrad [place], MD;  Location: [facility];  Service: Vascular;  Laterality: Right;   AV FISTULA PLACEMENT Left 05/09/2017   Procedure: INSERTION OF ARTERIOVENOUS (AV) GRAFT ARM (ARTEGRAFT);  Surgeon: Conrad Jane Lew, MD;  [facility] OR;  Service: Vascular;  Laterality: Left;   AV FISTULA PLACEMENT Right 02/17/2018   Procedure: INSERTION OF ARTERIOVENOUS (AV) GORE-TEX GRAFT ARM RIGHT UPPER ARM;  Surgeon: Conrad W[place], MD;  Location: [facility];  Service: Vascular;  Laterality: Right;   AV FISTULA PLACEMENT Left 10/10/2019   Procedure: INSERTION OF LEFT ARTERIOVENOUS (AV) ARTEGRAFT GRAFT ARM;  Surgeon: Waynetta Sandy, MD;  Location: [facility];  Service: Vascular;  Laterality: Left;   AV FISTULA PLACEMENT Right 03/19/2020   Procedure: INSERTION OF ARTERIOVENOUS (AV) GORE-TEX GRAFT IN RIGHT  ARM AND VIABAHN STENT IN RIGHT AXILLARY ARTERY;  Surgeon: Waynetta Sandy, MD;  Location: [facility];  Service: Vascular;  Laterality: Right;   [place] Left 08/31/2016   Procedure: BASILIC VEIN TRANSPOSITION FIRST STAGE;  Surgeon: Conrad San Ac[place], MD;  Location: [facility];  Service: Vascular;  Laterality: Left;   BRONCHIAL WASHINGS  09/17/2021   Procedure: BRONCHIAL WASHINGS;  Surgeon: Collene Gobble, MD;  Location: [facility];  Service: Cardiopulmonary;;   CENTRAL VENOUS CATHETER INSERTION Left 07/24/2018   Procedure: INSERTION CENTRAL LINE ADULT;  Surgeon: Conrad Pender, MD;  Location: [facility];  Service: Vascular;  Laterality: Left;   EYE SURGERY     secondary to diabetic retinopathy    FOOT SURGERY Right    t"ook bone out- maybe hammer toe"   INSERTION OF DIALYSIS CATHETER Left 07/24/2018   Procedure: INSERTION OF TUNNELED DIALYSIS CATHETER;  Surgeon: Conrad San [place], MD;  Location: [place];  Service: Vascular;  Laterality: Left;   IR FLUORO GUIDE CV LINE LEFT  12/19/2021   IR FLUORO GUIDE CV LINE LEFT  12/29/2021   IR FLUORO GUIDE CV LINE RIGHT  07/21/2018   IR FLUORO GUIDE CV LINE RIGHT  06/01/2019   IR FLUORO GUIDE CV LINE RIGHT  05/05/2020   IR REMOVAL TUN CV CATH W/O FL  01/23/2021   IR REMOVAL TUN CV CATH W/O FL  12/19/2021   IR US GUIDE VASC ACCESS LEFT  12/19/2021   IR US GUIDE VASC ACCESS RIGHT  07/21/2018   IR US GUIDE VASC ACCESS RIGHT  06/01/2019   LIGATION ARTERIOVENOUS GORTEX GRAFT Right 03/20/2020   Procedure: LIGATION ARM ARTERIOVENOUS GORTEX GRAFT With Patch Angioplasty, Thrombectomy;  Surgeon: Waynetta Sandy, MD;  Location: [place];  Service: Vascular;  Laterality: Right;   LIGATION OF ARTERIOVENOUS  FISTULA Left 05/09/2017   Procedure: LIGATION OF ARTERIOVENOUS  FISTULA;  Surgeon: Conrad Rincon, MD;  Location: [facility] [place];  Service: Vascular;  Laterality: Left;   LOOP RECORDER INSERTION N/A 12/05/2017   Procedure: LOOP RECORDER INSERTION;  Surgeon: Evans Lance,  MD;  Location: [facility];  Service: Cardiovascular;  Laterality: N/A;   MYOMECTOMY     REMOVAL OF GRAFT Right 02/17/2018   Procedure: REMOVAL OF RIGHT UPPER ARM ARTERIOVENOUS GRAFT;  Surgeon: Conrad [place], MD;  Location: [facility];  Service: Vascular;  Laterality: Right;   REVISION OF ARTERIOVENOUS GORETEX GRAFT Left 07/24/2018   Procedure: REDO ARTERIOVENOUS GORETEX GRAFT;  Surgeon: Conrad [place], MD;  Location: [facility];  Service: Vascular;  Laterality: Left;   TEE WITHOUT CARDIOVERSION N/A 12/05/2017   Procedure: TRANSESOPHAGEAL ECHOCARDIOGRAM (TEE);  Surgeon: Sanda Klein, MD;  Location: [facility];  Service: Cardiovascular;  Laterality: N/A;   UPPER EXTREMITY VENOGRAPHY Left 07/13/2018   Procedure: UPPER EXTREMITY VENOGRAPHY - Central & Left Arm;  Surgeon: Conrad Turner, MD;  Location: [facility];  Service: Cardiovascular;  Laterality: Left;   UPPER EXTREMITY VENOGRAPHY Bilateral 09/10/2019   Procedure: UPPER EXTREMITY VENOGRAPHY;  Surgeon: Waynetta Sandy, MD;  Location: [facility];  Service: Cardiovascular;  Laterality: Bilateral;   UTERINE FIBROID SURGERY  VIDEO BRONCHOSCOPY N/A 09/17/2021   Procedure: VIDEO BRONCHOSCOPY WITHOUT FLUORO;  Surgeon: Collene Gobble, MD;  Location: [facility];  Service: Cardiopulmonary;  Laterality: N/A;   VITRECTOMY Bilateral     Vitals:   09/18/21 1023  BP: 132/86                                      PT Short Term Goals - 08/29/21 1905       PT SHORT TERM GOAL #1   Title Pt will be independent with initial HEP for strengtheining and balance to decrease fall risk.    Baseline no current HEP    Time 4    Period Weeks    Status New    Target Date 09/26/21      PT SHORT TERM GOAL #2   Title Pt will be independent with prosthetic management for improved function.    Baseline currently needs assist and education    Time 4    Period Weeks    Status New    Target Date 09/26/21       PT SHORT TERM GOAL #3   Title Pt will be able to wear prosthesis for total of 8 hours/day for improved function on non dialysis days    Baseline currently not wearing consistently    Time 4    Period Weeks    Status New    Target Date 09/26/21      PT SHORT TERM GOAL #4   Title Pt will decrease TUG from 37 sec to <30 sec for improved balance and decreased fall risk.    Baseline 08/28/21 37 sec    Time 4    Period Weeks    Status New    Target Date 09/26/21      PT SHORT TERM GOAL #5   Title Pt will ambulate >400' on level surfaces with RW and prosthesis mod I for improved household and short community distances.    Baseline 59' CGA with RW    Time 4    Period Weeks    Status New    Target Date 09/26/21      Additional Short Term Goals   Additional Short Term Goals Yes      PT SHORT TERM GOAL #6   Title Pt will ambulate up/down 3 steps with rails supervision to enter/exit home safely.    Baseline husband bringing her up/down in w/c    Time 4    Period Weeks    Status New    Target Date 09/26/21               PT Long Term Goals - 09/09/21 2013       PT LONG TERM GOAL #1   Title Pt will be able to tolerate wearing prosthesis all awake hours for improved function.    Baseline not wearing consistently at eval    Time 8    Period Weeks    Status New      PT LONG TERM GOAL #2   Title Pt will increase Berg Balance form 24 to >40/56 for improved balance and decreased fall risk.    Baseline 09/09/21 24/56    Time 8    Period Weeks    Status New      PT LONG TERM GOAL #3   Title Pt will increase gait speed from 0.25ms to >0.6 m/s for improved  household mobility.    Baseline 08/28/21 0.51ms    Time 8    Period Weeks    Status New      PT LONG TERM GOAL #4   Title Pt will ambulate >600' with LRAD mod I on level indoor and outdoor surfaces.    Baseline 765 with RW CGA    Time 8    Period Weeks    Status New      PT LONG TERM GOAL #5   Title Pt will  ambulate up/down curb and ramp with LRAD supervision for improved community access.    Baseline unable to perform    Time 8    Period Weeks    Status New                     Patient will benefit from skilled therapeutic intervention in order to improve the following deficits and impairments:  Abnormal gait, Decreased activity tolerance, Decreased balance, Decreased endurance, Decreased knowledge of use of DME, Decreased mobility, Decreased range of motion, Difficulty walking, Decreased strength, Impaired sensation, Prosthetic Dependency  Visit Diagnosis: Other abnormalities of gait and mobility  Muscle weakness (generalized)  Unsteadiness on feet     Problem List Patient Active Problem List   Diagnosis Date Noted   Shortness of breath 07/02/2022   Thrombus of venous dialysis catheter ([facility] 02/25/2022   ESRD (end stage renal disease) ([place] 12/29/2021   Displacement of vascular dialysis catheter, initial encounter ([facility] 12/18/2021   S/P BKA (below knee amputation), right ([facility] 03/31/2021   Impaired mobility and ADLs 03/25/2021   CVA (cerebral vascular accident) ([facility] 03/04/2021   Pulmonary embolus ([facility] 03/04/2021   Charcot ankle, right 02/23/2021   Hx of cardiac arrest 11/12/2020   Abnormal CT of the chest 09/26/2020   Allergy, unspecified, initial encounter 09/22/2020   Fluid overload, unspecified 06/10/2020   Other mechanical complication of other cardiac and vascular devices and implants, initial encounter ([facility] 05/10/2020   Positive ANA (antinuclear antibody) 04/16/2020   ESRD on dialysis ([facility]    Hemiplegia and hemiparesis following cerebral infarction affecting left non-dominant side ([facility] 12/29/2018   IBS (irritable bowel syndrome) 08/06/2017   Dyslipidemia associated with type 2 diabetes mellitus ([facility] [place] 06/25/2016   Proliferative diabetic retinopathy ([facility] [facility] 09/29/2015   Chronic cough 10/04/2014   B12 deficiency 05/10/2014   Anemia in chronic kidney  disease, on chronic dialysis ([facility] 04/26/2014   CKD (chronic kidney disease) stage 5, GFR less than 15 ml/min (HCC) 04/26/2014   Secondary hyperparathyroidism of renal origin ([place] 11/27/2013   Posterior subcapsular cataract, bilateral 10/18/2013   Diabetes mellitus, type II, insulin dependent ([facility], on CGM, with end organ damage: renal, neuropathy, gastroparesis, retinopathy 02/19/2011   Hypertension associated with diabetes ([facility] [place] 02/19/2011   Gastroparesis due to DM ([facility] 02/19/2011    LANDRY, MICHELE RENEE, PT, DPT, NCS 09/27/2022, 1:52 PM  [facility] [facility] [address]Phone: [phone]  Fax:  [phone] Name: RMAYU RONKMRN: [id number] of Birth: [birth year]

## 2021-09-19 LAB — ACID FAST SMEAR (AFB, MYCOBACTERIA): Acid Fast Smear: NEGATIVE

## 2021-09-19 LAB — CULTURE, RESPIRATORY W GRAM STAIN: Culture: NORMAL

## 2021-09-22 LAB — ANAEROBIC CULTURE W GRAM STAIN: Gram Stain: NONE SEEN

## 2021-09-23 ENCOUNTER — Ambulatory Visit: Payer: Medicare Other

## 2021-09-25 ENCOUNTER — Ambulatory Visit (INDEPENDENT_AMBULATORY_CARE_PROVIDER_SITE_OTHER): Payer: Managed Care, Other (non HMO) | Admitting: Emergency Medicine

## 2021-09-25 ENCOUNTER — Encounter (HOSPITAL_BASED_OUTPATIENT_CLINIC_OR_DEPARTMENT_OTHER): Payer: Self-pay | Admitting: Emergency Medicine

## 2021-09-25 ENCOUNTER — Emergency Department (HOSPITAL_BASED_OUTPATIENT_CLINIC_OR_DEPARTMENT_OTHER)
Admission: EM | Admit: 2021-09-25 | Discharge: 2021-09-25 | Disposition: A | Discharge status: Home / Self Care | Payer: Medicare Other | Attending: Emergency Medicine | Admitting: Emergency Medicine

## 2021-09-25 ENCOUNTER — Encounter: Payer: Self-pay | Admitting: Emergency Medicine

## 2021-09-25 ENCOUNTER — Emergency Department (HOSPITAL_BASED_OUTPATIENT_CLINIC_OR_DEPARTMENT_OTHER): Payer: Medicare Other

## 2021-09-25 ENCOUNTER — Ambulatory Visit: Payer: Medicare Other

## 2021-09-25 ENCOUNTER — Other Ambulatory Visit: Payer: Self-pay

## 2021-09-25 DIAGNOSIS — I120 Hypertensive chronic kidney disease with stage 5 chronic kidney disease or end stage renal disease: Secondary | ICD-10-CM | POA: Insufficient documentation

## 2021-09-25 DIAGNOSIS — R053 Chronic cough: Secondary | ICD-10-CM | POA: Diagnosis not present

## 2021-09-25 DIAGNOSIS — E1122 Type 2 diabetes mellitus with diabetic chronic kidney disease: Secondary | ICD-10-CM | POA: Insufficient documentation

## 2021-09-25 DIAGNOSIS — N186 End stage renal disease: Secondary | ICD-10-CM | POA: Diagnosis not present

## 2021-09-25 DIAGNOSIS — Z794 Long term (current) use of insulin: Secondary | ICD-10-CM | POA: Insufficient documentation

## 2021-09-25 DIAGNOSIS — Z20822 Contact with and (suspected) exposure to covid-19: Secondary | ICD-10-CM | POA: Insufficient documentation

## 2021-09-25 DIAGNOSIS — J029 Acute pharyngitis, unspecified: Secondary | ICD-10-CM | POA: Diagnosis present

## 2021-09-25 DIAGNOSIS — Z992 Dependence on renal dialysis: Secondary | ICD-10-CM | POA: Diagnosis not present

## 2021-09-25 LAB — RESP PANEL BY RT-PCR (FLU A&B, COVID) ARPGX2
Influenza A by PCR: NEGATIVE
Influenza B by PCR: NEGATIVE
SARS Coronavirus 2 by RT PCR: NEGATIVE

## 2021-09-25 LAB — GROUP A STREP BY PCR: Group A Strep by PCR: NOT DETECTED

## 2021-09-25 MED ORDER — HYDROCODONE-ACETAMINOPHEN 5-325 MG PO TABS
1.0000 | ORAL_TABLET | Freq: Once | ORAL | Status: AC
  Administered 2021-09-25: 1 via ORAL
  Filled 2021-09-25: qty 1

## 2021-09-25 MED ORDER — BENZONATATE 100 MG PO CAPS: 100.0000 mg | ORAL_CAPSULE | Freq: Two times a day (BID) | ORAL | Status: DC | PRN

## 2021-09-25 MED ORDER — BENZONATATE 100 MG PO CAPS: 100.0000 mg | ORAL_CAPSULE | Freq: Four times a day (QID) | ORAL | 0 refills | Status: DC | PRN

## 2021-09-25 MED ORDER — HYDROCODONE-ACETAMINOPHEN 5-325 MG PO TABS: 1.0000 | ORAL_TABLET | ORAL | 0 refills | Status: DC | PRN

## 2021-09-25 NOTE — Progress Notes (Signed)
Virtual Visit via Telephone Note  I connected with Heather [name] on 09/25/21 at  4:00 PM EDT by telephone and verified that I am speaking with the correct person using two identifiers.  Location: Patient: Home Provider: Office   I discussed the limitations, risks, security and privacy concerns of performing an evaluation and management service by telephone and the availability of in person appointments. I also discussed with the patient that there may be a patient responsible charge related to this service. The patient expressed understanding and agreed to proceed.   History of Present Illness: 50 year old never smoker with history of obesity, diabetes, hypertension, end-stage renal disease on HD, VT/PE, GERD and prior CVA. I saw her in August for chronic cough and an abnormal CT scan of the chest that showed micronodular tree-in-bud opacities in the bilateral upper lobes suggestive of possible mycobacterial disease. We had planned to do pulmonary function testing, have not been done yet   Observations/Objective: She underwent bronchoscopy on 09/17/2021.  AFB and fungal smears are negative, cultures are still pending. She was in the ED earlier today with sore throat, bilateral earache, productive cough >>  white mucous. The ED gave her tessalon perles, hydrocodone. No CP.   Assessment and Plan: We will plan to follow up in about 1 month to review FOB cx data.  She wants to hold off on rescheduling PFT for now. We can discuss next time.   Follow Up Instructions: 1 month.    I discussed the assessment and treatment plan with the patient. The patient was provided an opportunity to ask questions and all were answered. The patient agreed with the plan and demonstrated an understanding of the instructions.   The patient was advised to call back or seek an in-person evaluation if the symptoms worsen or if the condition fails to improve as anticipated.  I provided 17 minutes of  non-face-to-face time during this encounter.   Collene Gobble, MD

## 2021-09-25 NOTE — ED Provider Notes (Signed)
[facility] EMERGENCY DEPARTMENT Provider Note   CSN: [id number] Arrival date & time: 09/25/21  1006     History Chief Complaint  Patient presents with   Sore Throat    Heather [name] is a 50 y.o. female.  The history is provided by the patient. No language interpreter was used.  Sore Throat This is a new problem. The current episode started more than 2 days ago. The problem occurs constantly. The problem has not changed since onset.Associated symptoms include headaches. Nothing aggravates the symptoms. Nothing relieves the symptoms. She has tried nothing for the symptoms. The treatment provided no relief.  Pt complains of cough and congestion.  Pt reports she has a sore throat.  Pt reports her husband has had the same.     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[place]), nonhemorrhagic, inferior right cerebellum 12/03/2017   left side weakness in leg   Cyst of left ovary 01/12/2018   Diabetic neuropathy, painful ([place]), on low dose Gabapentin 07/13/2013   DM (diabetes mellitus), type 2, uncontrolled, with renal complications ([place]) 29/93/7169   DM2 (diabetes mellitus, type 2) ([place])    Dysfunction of left eustachian tube, with pusatile tinnitus 11/24/2017   Dyslipidemia associated with type 2 diabetes mellitus ([place]) 06/25/2016   ESRD (end stage renal disease) on dialysis [facility])    On hemodialysis in July 2019 via [facility] then switched to [facility] in Nov 2019.    ESRD with anemia (HCC)    Fibromyalgia    Gastroparesis due to DM    GERD (gastroesophageal reflux disease)    Headache    Hypertension associated with diabetes ([place]) 02/19/2011   IBS (irritable bowel syndrome)    Left-sided weakness 12/10/2017   .   Leiomyoma of uterus    Pancreatitis    PE (pulmonary thromboembolism)  ([place]) 11/04/2019   Pneumonia    Proliferative diabetic retinopathy ([place]) 09/29/2015   Pronation deformity of both feet 06/14/2014   Reactive depression 12/10/2017   .   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[place]) 02/17/2018   Thromboembolism ([place]) 04/05/2018   Upper airway cough syndrome, with recs to stay off ACE and take Pepcid q hs 11/15/2016    Patient Active Problem List   Diagnosis Date Noted   S/P BKA (below knee amputation), right ([place]) 03/31/2021   Impaired mobility and ADLs 03/25/2021   CVA (cerebral vascular accident) ([place]) 03/04/2021   Pulmonary embolus ([place]) 03/04/2021   Charcot ankle, right 02/23/2021   Hx of cardiac arrest 11/12/2020   Abnormal CT of the chest 09/26/2020   Allergy, unspecified, initial encounter 09/22/2020   Anaphylactic shock, unspecified, initial encounter 09/22/2020   Fluid overload, unspecified 06/10/2020   Other mechanical complication of other cardiac and vascular devices and implants, initial encounter ([place]) 05/10/2020   Positive ANA (antinuclear antibody) 04/16/2020   ESRD on dialysis [facility])    Hemiplegia and hemiparesis following cerebral infarction affecting left non-dominant side ([place]) 12/29/2018   IBS (irritable bowel syndrome) 08/06/2017   Dyslipidemia associated with type 2 diabetes mellitus ([place]) 06/25/2016   Proliferative diabetic retinopathy ([place]) 09/29/2015   Chronic cough 10/04/2014   B12 deficiency 05/10/2014   Anemia in chronic kidney disease 04/26/2014   CKD (chronic kidney disease)  stage 5, GFR less than 15 ml/min (HCC) 04/26/2014   Secondary hyperparathyroidism of renal origin ([place]) 11/27/2013   Posterior subcapsular cataract, bilateral 10/18/2013   Diabetic neuropathy, painful ([place]) 07/13/2013   Diabetes mellitus, type II, insulin dependent ([place]), on CGM, with end organ damage: renal, neuropathy, gastroparesis, retinopathy  02/19/2011   Hypertension associated with diabetes ([place]) 02/19/2011   Gastroparesis due to DM ([place]) 02/19/2011    Past Surgical History:  Procedure Laterality Date   A/V FISTULAGRAM Right 03/03/2020   Procedure: Venogram;  Surgeon: Waynetta Sandy, MD;  Location: [facility];  Service: Cardiovascular;  Laterality: Right;   ARTERY REPAIR Right 02/17/2018   Procedure: EXPLORATION OF RIGHT BRACHIAL ARTERY;  Surgeon: Conrad Mounds, MD;  Location: [facility];  Service: Vascular;  Laterality: Right;   ARTERY REPAIR Right 02/17/2018   Procedure: BRACHIAL ARTERY EXPLORATION AND TRHOMBECTOMY;  Surgeon: Conrad Morton, MD;  Location: [facility];  Service: Vascular;  Laterality: Right;   AV FISTULA PLACEMENT Left 05/09/2017   Procedure: INSERTION OF ARTERIOVENOUS (AV) GRAFT ARM (ARTEGRAFT);  Surgeon: Conrad [place], MD;  Location: [place];  Service: Vascular;  Laterality: Left;   AV FISTULA PLACEMENT Right 02/17/2018   Procedure: INSERTION OF ARTERIOVENOUS (AV) GORE-TEX GRAFT ARM RIGHT UPPER ARM;  Surgeon: Conrad [place], MD;  Location: [facility];  Service: Vascular;  Laterality: Right;   AV FISTULA PLACEMENT Left 10/10/2019   Procedure: INSERTION OF LEFT ARTERIOVENOUS (AV) ARTEGRAFT GRAFT ARM;  Surgeon: Waynetta Sandy, MD;  Location: [place];  Service: Vascular;  Laterality: Left;   AV FISTULA PLACEMENT Right 03/19/2020   Procedure: INSERTION OF ARTERIOVENOUS (AV) GORE-TEX GRAFT IN RIGHT ARM AND VIABAHN STENT IN RIGHT AXILLARY ARTERY;  Surgeon: Waynetta Sandy, MD;  Location: [place];  Service: Vascular;  Laterality: Right;   [place] Left 08/31/2016   Procedure: BASILIC VEIN TRANSPOSITION FIRST STAGE;  Surgeon: Conrad Brady, MD;  Location: [facility];  Service: Vascular;  Laterality: Left;   BRONCHIAL WASHINGS  09/17/2021   Procedure: BRONCHIAL WASHINGS;  Surgeon: Collene Gobble, MD;  Location: [facility];  Service: Cardiopulmonary;;   CENTRAL VENOUS CATHETER INSERTION Left  07/24/2018   Procedure: INSERTION CENTRAL LINE ADULT;  Surgeon: Conrad Jewett, MD;  Location: [facility];  Service: Vascular;  Laterality: Left;   EYE SURGERY     secondary to diabetic retinopathy    FOOT SURGERY Right    t"ook bone out- maybe hammer toe"   INSERTION OF DIALYSIS CATHETER Left 07/24/2018   Procedure: INSERTION OF TUNNELED DIALYSIS CATHETER;  Surgeon: Conrad Ava, MD;  Location: [facility];  Service: Vascular;  Laterality: Left;   IR FLUORO GUIDE CV LINE RIGHT  07/21/2018   IR FLUORO GUIDE CV LINE RIGHT  06/01/2019   IR FLUORO GUIDE CV LINE RIGHT  05/05/2020   IR REMOVAL TUN CV CATH W/O FL  01/23/2021   IR US GUIDE VASC ACCESS RIGHT  07/21/2018   IR US GUIDE VASC ACCESS RIGHT  06/01/2019   LIGATION ARTERIOVENOUS GORTEX GRAFT Right 03/20/2020   Procedure: LIGATION ARM ARTERIOVENOUS GORTEX GRAFT With Patch Angioplasty, Thrombectomy;  Surgeon: Waynetta Sandy, MD;  Location: [facility];  Service: Vascular;  Laterality: Right;   LIGATION OF ARTERIOVENOUS  FISTULA Left 05/09/2017   Procedure: LIGATION OF ARTERIOVENOUS  FISTULA;  Surgeon: Conrad Brandt, MD;  Location: [facility];  Service: Vascular;  Laterality: Left;   LOOP RECORDER INSERTION N/A 12/05/2017   Procedure: LOOP RECORDER  INSERTION;  Surgeon: Evans Lance, MD;  Location: [facility];  Service: Cardiovascular;  Laterality: N/A;   MYOMECTOMY     REMOVAL OF GRAFT Right 02/17/2018   Procedure: REMOVAL OF RIGHT UPPER ARM ARTERIOVENOUS GRAFT;  Surgeon: Conrad Homer, MD;  Location: [facility];  Service: Vascular;  Laterality: Right;   REVISION OF ARTERIOVENOUS GORETEX GRAFT Left 07/24/2018   Procedure: REDO ARTERIOVENOUS GORETEX GRAFT;  Surgeon: Conrad [place], MD;  Location: [place];  Service: Vascular;  Laterality: Left;   TEE WITHOUT CARDIOVERSION N/A 12/05/2017   Procedure: TRANSESOPHAGEAL ECHOCARDIOGRAM (TEE);  Surgeon: Sanda Klein, MD;  Location: [facility];  Service: Cardiovascular;  Laterality: N/A;   UPPER EXTREMITY VENOGRAPHY  Left 07/13/2018   Procedure: UPPER EXTREMITY VENOGRAPHY - Central & Left Arm;  Surgeon: Conrad Durango, MD;  Location: [facility];  Service: Cardiovascular;  Laterality: Left;   UPPER EXTREMITY VENOGRAPHY Bilateral 09/10/2019   Procedure: UPPER EXTREMITY VENOGRAPHY;  Surgeon: Waynetta Sandy, MD;  Location: [facility];  Service: Cardiovascular;  Laterality: Bilateral;   UTERINE FIBROID SURGERY     VIDEO BRONCHOSCOPY N/A 09/17/2021   Procedure: VIDEO BRONCHOSCOPY WITHOUT FLUORO;  Surgeon: Collene Gobble, MD;  [facility];  Service: Cardiopulmonary;  Laterality: N/A;   VITRECTOMY Bilateral      OB History     Gravida  2   Para  1   Term      Preterm      AB  1   Living  1      SAB  1   IAB      Ectopic      Multiple      Live Births              Family History  Problem Relation Age of Onset   Colon polyps Mother    Diabetes Mother    Heart murmur Father        history essentially unknown   Hypertension Father    Diabetes Sister    Kidney failure Sister        kidney transplant   Diabetes Brother    Retinal degeneration Brother    Heart murmur Son    Stroke Paternal Grandmother    Diabetes Sister     Social History   Tobacco Use   Smoking status: Never   Smokeless tobacco: Never  Vaping Use   Vaping Use: Never used  Substance Use Topics   Alcohol use: No    Alcohol/week: 0.0 standard drinks   Drug use: No    Home Medications Prior to Admission medications   Medication Sig Start Date End Date Taking? Authorizing Provider  benzonatate (TESSALON) 100 MG capsule Take 1 capsule (100 mg total) by mouth 2 (two) times daily as needed for cough. 09/17/21   Collene Gobble, MD  calcium acetate (PHOSLO) 667 MG capsule Take 667 mg by mouth 3 (three) times daily with meals. 08/25/21   [provider]  Continuous Blood Gluc Sensor (FREESTYLE LIBRE 14 DAY SENSOR) MISC 1 patch by Does not apply route every 14 (fourteen)  days. 07/10/21   [name], MD  gabapentin (NEURONTIN) 300 MG capsule Take 1 capsule (300 mg total) by mouth 3 (three) times daily. Take after dialysis. Do not take more than three times weekly. 09/17/21   Collene Gobble, MD  insulin aspart (NOVOLOG FLEXPEN) 100 UNIT/ML FlexPen Inject 6 Units into the skin 3 (three) times  daily with meals. Sliding scale 07/16/20   [name], MD  insulin degludec (TRESIBA FLEXTOUCH) 100 UNIT/ML SOPN FlexTouch Pen Inject 0.26 mLs (26 Units total) into the skin at bedtime. 12/26/19   Shamleffer, Melanie Crazier, MD  Insulin Pen Needle 29G X 5MM MISC 1 Device by Does not apply route 4 (four) times daily. 12/26/19   Shamleffer, Melanie Crazier, MD  midodrine (PROAMATINE) 5 MG tablet Take 1 tablet (5 mg total) by mouth See admin instructions. Given at Dialysis if needed 09/17/21   Collene Gobble, MD  nystatin-triamcinolone ointment [facility]) Apply 1 application topically daily as needed (sore on leg). 09/17/21   Collene Gobble, MD  promethazine (PHENERGAN) 25 MG tablet Take 1 tablet (25 mg total) by mouth every 8 (eight) hours as needed for nausea or vomiting. 03/31/21   [name], MD    Allergies    Ibuprofen, Dilaudid [hydromorphone hcl], Iodinated diagnostic agents, and Tramadol  Review of Systems   Review of Systems  Neurological:  Positive for headaches.  All other systems reviewed and are negative.  Physical Exam Updated Vital Signs BP (!) 163/83 (BP Location: Right Arm)   Pulse 88   Temp 99.2 F (37.3 C) (Oral)   Resp 18   Ht 5\' 8"  (1.727 m)   Wt 98 kg   LMP 09/10/2021   SpO2 95%   BMI 32.84 kg/m   Physical Exam Vitals and nursing note reviewed.  Constitutional:      Appearance: [name] well-developed.  HENT:     Head: Normocephalic.     Mouth/Throat:     Mouth: Mucous membranes are moist.  Cardiovascular:     Rate and Rhythm: Normal rate.  Pulmonary:     Effort: Pulmonary effort is normal.  Abdominal:     General: There is  no distension.  Musculoskeletal:        General: Normal range of motion.     Cervical back: Normal range of motion.  Neurological:     Mental Status: [name] alert and oriented to person, place, and time.    ED Results / Procedures / Treatments   Labs (all labs ordered are listed, but only abnormal results are displayed) Labs Reviewed  GROUP A STREP BY PCR  RESP PANEL BY RT-PCR (FLU A&B, COVID) [id number]    EKG None  Radiology DG Chest 2 View  Result Date: 09/25/2021 CLINICAL DATA:  Cough, sore throat EXAM: CHEST - 2 VIEW COMPARISON:  07/29/2021 FINDINGS: Mild cardiomegaly without CHF or pneumonia. No focal airspace process, collapse or consolidation. No effusion or pneumothorax. Healed posterior left rib fractures. Left IJ dialysis catheter tip at the innominate venous confluence. Right axillary stent again noted. IMPRESSION: Stable cardiomegaly without acute process. Left IJ dialysis catheter tip at the innominate venous confluence. Electronically Signed   By: Jerilynn Mages.  Shick M.D.   On: 09/25/2021 13:12    Procedures Procedures   Medications Ordered in ED Medications - No data to display  ED Course  I have reviewed the triage vital signs and the nursing notes.  Pertinent labs & imaging results that were available during my care of the patient were reviewed by me and considered in my medical decision making (see chart for details).    MDM Rules/Calculators/A&P                           MDM:  chest xray no acute abnormality    Final Clinical  Impression(s) / ED Diagnoses Final diagnoses:  Pharyngitis, unspecified etiology    Rx / DC Orders ED Discharge Orders     None     An After Visit Summary was printed and given to the patient.    Fransico Meadow, [place] 09/25/21 1959    Lucrezia Starch, MD 09/28/21 [phone]

## 2021-09-25 NOTE — ED Triage Notes (Signed)
Sore throat , bilateral earache, no fever. Productive cough  x 1 week .

## 2021-09-25 NOTE — Discharge Instructions (Addendum)
Return if any problems.

## 2021-09-29 ENCOUNTER — Telehealth (INDEPENDENT_AMBULATORY_CARE_PROVIDER_SITE_OTHER): Payer: Managed Care, Other (non HMO) | Admitting: Physician Assistant

## 2021-09-29 VITALS — BP 148/84 | Temp 98.4°F | Ht 68.0 in

## 2021-09-29 DIAGNOSIS — J029 Acute pharyngitis, unspecified: Secondary | ICD-10-CM

## 2021-09-29 DIAGNOSIS — H9202 Otalgia, left ear: Secondary | ICD-10-CM

## 2021-09-29 MED ORDER — AMOXICILLIN-POT CLAVULANATE 500-125 MG PO TABS: 1.0000 | ORAL_TABLET | Freq: Two times a day (BID) | ORAL | 0 refills | Status: AC

## 2021-09-29 NOTE — Patient Instructions (Addendum)
Take one tab Augment after dialysis, do twice daily dosing on non-dialysis days.

## 2021-09-29 NOTE — Progress Notes (Signed)
Virtual Visit via Video Note  I connected with  Heather [name]  on 09/29/21 at  9:30 AM EDT by a video enabled telemedicine application and verified that I am speaking with the correct person using two identifiers.  Location: Patient: home Provider: Therapist, music at [facility] present: Patient and myself   I discussed the limitations of evaluation and management by telemedicine and the availability of in person appointments. The patient expressed understanding and agreed to proceed.   History of Present Illness: Chief complaint: Left ear pain Symptom onset: 2 weeks Pertinent positives: ST, runny nose Pertinent negatives: Fever, chills, body aches, cough, n/v/d Treatments tried: Nothing Vaccine status: Moderna x 2   Sick exposure: No sick contacts  Went to urgent care on 09-25-21, COVID-19, flu, strep A all negative.  [name] a dialysis patient on T, Th, Sat schedule. Already completed dialysis this morning.    Observations/Objective:   Gen: Awake, alert, no acute distress Resp: Breathing is even and non-labored Psych: calm/pleasant demeanor Neuro: Alert and Oriented x 3, + facial symmetry, speech is clear.   Assessment and Plan: 1. Acute pain of left ear 2. Pharyngitis, unspecified etiology Ongoing symptoms >10 days. Limited exam given virtual appointment today, however, suspected possible OM or sinus component. Dialysis patient. Will treat with Augmentin 500-125 mg. She will take one tablet after dialysis. On non-dialysis days, she will take this twice daily. Also advised nasal saline, humidifier, allergy medication such as Claritin. Recheck in person if worse or no improvement.    Follow Up Instructions:    I discussed the assessment and treatment plan with the patient. The patient was provided an opportunity to ask questions and all were answered. The patient agreed with the plan and demonstrated an understanding of the instructions.   The  patient was advised to call back or seek an in-person evaluation if the symptoms worsen or if the condition fails to improve as anticipated.  Teonia Yager M Norman Piacentini, PA-C

## 2021-09-30 ENCOUNTER — Ambulatory Visit: Payer: Managed Care, Other (non HMO) | Attending: Family Medicine

## 2021-09-30 ENCOUNTER — Telehealth: Payer: Self-pay

## 2021-09-30 NOTE — Telephone Encounter (Signed)
PT had tried to call pt last week when no showed for visits but no answer. Called again today and left message. I do see that pt has been in urgent care and had a couple MD consults for sore throat and other symptoms. Asked pt to call back to let us know if she will be able to make Friday appointment. Also tried her husband's number but went to voicemail as well. Cherly Anderson, PT, DPT, NCS

## 2021-10-01 ENCOUNTER — Ambulatory Visit: Payer: Medicare Other | Admitting: Sports Medicine

## 2021-10-02 ENCOUNTER — Ambulatory Visit: Payer: Managed Care, Other (non HMO)

## 2021-10-07 ENCOUNTER — Ambulatory Visit: Payer: Managed Care, Other (non HMO)

## 2021-10-07 ENCOUNTER — Telehealth: Payer: Self-pay

## 2021-10-07 NOTE — Telephone Encounter (Signed)
PT called pt again to check on her as had another no show. Left message letting her know that we would have to take her off schedule at this time to open visits for other pt's. She can call back if wants to get back on schedule. Cherly Anderson, PT, DPT, NCS

## 2021-10-09 ENCOUNTER — Ambulatory Visit: Payer: Managed Care, Other (non HMO)

## 2021-10-16 ENCOUNTER — Emergency Department (HOSPITAL_COMMUNITY)
Admission: EM | Admit: 2021-10-16 | Discharge: 2021-10-16 | Disposition: A | Discharge status: Home / Self Care | Payer: Medicare Other | Attending: Emergency Medicine | Admitting: Emergency Medicine

## 2021-10-16 ENCOUNTER — Emergency Department (HOSPITAL_COMMUNITY): Payer: Medicare Other

## 2021-10-16 DIAGNOSIS — E1143 Type 2 diabetes mellitus with diabetic autonomic (poly)neuropathy: Secondary | ICD-10-CM | POA: Insufficient documentation

## 2021-10-16 DIAGNOSIS — Z794 Long term (current) use of insulin: Secondary | ICD-10-CM | POA: Diagnosis not present

## 2021-10-16 DIAGNOSIS — E1122 Type 2 diabetes mellitus with diabetic chronic kidney disease: Secondary | ICD-10-CM | POA: Diagnosis not present

## 2021-10-16 DIAGNOSIS — E11319 Type 2 diabetes mellitus with unspecified diabetic retinopathy without macular edema: Secondary | ICD-10-CM | POA: Insufficient documentation

## 2021-10-16 DIAGNOSIS — R1012 Left upper quadrant pain: Secondary | ICD-10-CM

## 2021-10-16 DIAGNOSIS — N186 End stage renal disease: Secondary | ICD-10-CM | POA: Diagnosis not present

## 2021-10-16 DIAGNOSIS — K219 Gastro-esophageal reflux disease without esophagitis: Secondary | ICD-10-CM | POA: Insufficient documentation

## 2021-10-16 DIAGNOSIS — Z992 Dependence on renal dialysis: Secondary | ICD-10-CM | POA: Diagnosis not present

## 2021-10-16 DIAGNOSIS — I120 Hypertensive chronic kidney disease with stage 5 chronic kidney disease or end stage renal disease: Secondary | ICD-10-CM | POA: Diagnosis not present

## 2021-10-16 DIAGNOSIS — N12 Tubulo-interstitial nephritis, not specified as acute or chronic: Secondary | ICD-10-CM | POA: Diagnosis not present

## 2021-10-16 DIAGNOSIS — D631 Anemia in chronic kidney disease: Secondary | ICD-10-CM | POA: Insufficient documentation

## 2021-10-16 DIAGNOSIS — R112 Nausea with vomiting, unspecified: Secondary | ICD-10-CM

## 2021-10-16 LAB — CBC WITH DIFFERENTIAL/PLATELET
Abs Immature Granulocytes: 0.12 10*3/uL — ABNORMAL HIGH (ref 0.00–0.07)
Basophils Absolute: 0 10*3/uL (ref 0.0–0.1)
Basophils Relative: 0 %
Eosinophils Absolute: 0.2 10*3/uL (ref 0.0–0.5)
Eosinophils Relative: 2 %
HCT: 29.5 % — ABNORMAL LOW (ref 36.0–46.0)
Hemoglobin: 8.8 g/dL — ABNORMAL LOW (ref 12.0–15.0)
Immature Granulocytes: 1 %
Lymphocytes Relative: 12 %
Lymphs Abs: 1.3 10*3/uL (ref 0.7–4.0)
MCH: 30.7 pg (ref 26.0–34.0)
MCHC: 29.8 g/dL — ABNORMAL LOW (ref 30.0–36.0)
MCV: 102.8 fL — ABNORMAL HIGH (ref 80.0–100.0)
Monocytes Absolute: 0.6 10*3/uL (ref 0.1–1.0)
Monocytes Relative: 5 %
Neutro Abs: 8.7 10*3/uL — ABNORMAL HIGH (ref 1.7–7.7)
Neutrophils Relative %: 80 %
Platelets: 196 10*3/uL (ref 150–400)
RBC: 2.87 MIL/uL — ABNORMAL LOW (ref 3.87–5.11)
RDW: 14.6 % (ref 11.5–15.5)
WBC: 10.9 10*3/uL — ABNORMAL HIGH (ref 4.0–10.5)
nRBC: 0.7 % — ABNORMAL HIGH (ref 0.0–0.2)

## 2021-10-16 LAB — COMPREHENSIVE METABOLIC PANEL
ALT: 26 U/L (ref 0–44)
AST: 24 U/L (ref 15–41)
Albumin: 3.4 g/dL — ABNORMAL LOW (ref 3.5–5.0)
Alkaline Phosphatase: 107 U/L (ref 38–126)
Anion gap: 14 (ref 5–15)
BUN: 25 mg/dL — ABNORMAL HIGH (ref 6–20)
CO2: 28 mmol/L (ref 22–32)
Calcium: 9.3 mg/dL (ref 8.9–10.3)
Chloride: 98 mmol/L (ref 98–111)
Creatinine, Ser: 9.01 mg/dL — ABNORMAL HIGH (ref 0.44–1.00)
GFR, Estimated: 5 mL/min — ABNORMAL LOW (ref >60)
Glucose, Bld: 237 mg/dL — ABNORMAL HIGH (ref 70–99)
Potassium: 5.1 mmol/L (ref 3.5–5.1)
Sodium: 140 mmol/L (ref 135–145)
Total Bilirubin: 0.4 mg/dL (ref 0.3–1.2)
Total Protein: 8.7 g/dL — ABNORMAL HIGH (ref 6.5–8.1)

## 2021-10-16 LAB — I-STAT BETA HCG BLOOD, ED (MC, WL, AP ONLY): I-stat hCG, quantitative: 6.1 m[IU]/mL — ABNORMAL HIGH (ref <5)

## 2021-10-16 LAB — LIPASE, BLOOD: Lipase: 45 U/L (ref 11–51)

## 2021-10-16 MED ORDER — DIPHENHYDRAMINE HCL 25 MG PO CAPS
25.0000 mg | ORAL_CAPSULE | Freq: Once | ORAL | Status: AC
  Administered 2021-10-16: 25 mg via ORAL
  Filled 2021-10-16: qty 1

## 2021-10-16 MED ORDER — PROMETHAZINE HCL 25 MG/ML IJ SOLN
25.0000 mg | Freq: Four times a day (QID) | INTRAMUSCULAR | Status: DC | PRN
  Administered 2021-10-16: 25 mg via INTRAMUSCULAR
  Filled 2021-10-16: qty 1

## 2021-10-16 MED ORDER — CEPHALEXIN 500 MG PO CAPS: 500.0000 mg | ORAL_CAPSULE | Freq: Two times a day (BID) | ORAL | 0 refills | Status: AC

## 2021-10-16 MED ORDER — FENTANYL CITRATE PF 50 MCG/ML IJ SOSY
100.0000 ug | PREFILLED_SYRINGE | Freq: Once | INTRAMUSCULAR | Status: AC
  Administered 2021-10-16: 100 ug via INTRAMUSCULAR
  Filled 2021-10-16: qty 2

## 2021-10-16 MED ORDER — PROMETHAZINE HCL 25 MG PO TABS: 25.0000 mg | ORAL_TABLET | Freq: Four times a day (QID) | ORAL | 0 refills | Status: DC | PRN

## 2021-10-16 NOTE — ED Provider Notes (Signed)
Emergency Medicine Provider Triage Evaluation Note  Heather [name] , a 50 y.o. female  was evaluated in triage.  Pt complains of Abdominal pain.  This started this morning.  She has not had pain like this before.  She reports that she doesn't feel any change after meds from EMS.  She makes makes urine almost every other day.  Last dialysis session was yesterday and she had a full treatment  Review of Systems  Positive: Abdominal pain, nausea and vomiting. Negative: Fevers  Physical Exam  BP (!) 193/93 (BP Location: Right Arm)   Pulse 100   Temp 98.6 F (37 C) (Oral)   Resp 18   LMP  (LMP Unknown)   SpO2 94%  Gen:   Awake, appears uncomfortable.  Resp:  Normal effort  MSK:   Moves extremities without difficulty  Other:  LLQ abdominal pain.    Medical Decision Making  Medically screening exam initiated at 1:27 PM.  Appropriate orders placed.  Heather [name] was informed that the remainder of the evaluation will be completed by another provider, this initial triage assessment does not replace that evaluation, and the importance of remaining in the ED until their evaluation is complete.  Note: Portions of this report may have been transcribed using voice recognition software. Every effort was made to ensure accuracy; however, inadvertent computerized transcription errors may be present    Lorin Glass, PA-C 10/16/21 1335    Pattricia Boss, MD 10/16/21 1710

## 2021-10-16 NOTE — ED Provider Notes (Signed)
[facility] EMERGENCY DEPARTMENT Provider Note   CSN: [id number] Arrival date & time: 10/16/21  1237     History Chief Complaint  Patient presents with   Abdominal Pain    Heather [name] is a 50 y.o. female.  HPI     50 year old female with a history of CVA, diabetes type 2, ESRD on dialysis, hypertension, pancreatitis, pulmonary embolus, right BKA who presents with concern for abdominal pain.  Reports that she was sitting at work, and sat down to have a cup of chicken salad and crackers for breakfast when she suddenly developed sharp left-sided abdominal pain.  Reports is in the left upper quadrant.  Has associated nausea and vomiting, with 4 episodes of vomiting.  Receives dialysis Tuesday, Thursday, Saturday and has not missed any treatments.  She received Zofran with EMS and has continued nausea.  The pain is severe and constant since it began this morning.  It was associated with some diaphoresis.  Denies any chest pain, shortness of breath, diarrhea, urinary symptoms or constipation.  Reports he makes very little urine at this point.  Denies vaginal bleeding or discharge.  Denies cough, fever or other concerns.  Has chronic congestion for the last month with unchanged.  Had a normal bowel movement today after this occurred.  Denies alcohol use, NSAID use, or marijuana use.  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[place]), nonhemorrhagic, inferior right cerebellum 12/03/2017   left side weakness in leg   Cyst of left ovary 01/12/2018   Diabetic neuropathy, painful ([place]), on low dose Gabapentin 07/13/2013   DM (diabetes mellitus), type 2, uncontrolled, with renal complications ([place]) 67/59/1638   DM2 (diabetes mellitus, type 2) ([place])    Dysfunction of left eustachian  tube, with pusatile tinnitus 11/24/2017   Dyslipidemia associated with type 2 diabetes mellitus ([place]) 06/25/2016   ESRD (end stage renal disease) on dialysis [facility])    On hemodialysis in July 2019 via [facility] then switched to [facility] in Nov 2019.    ESRD with anemia (HCC)    Fibromyalgia    Gastroparesis due to DM    GERD (gastroesophageal reflux disease)    Headache    Hypertension associated with diabetes ([place]) 02/19/2011   IBS (irritable bowel syndrome)    Left-sided weakness 12/10/2017   .   Leiomyoma of uterus    Pancreatitis    PE (pulmonary thromboembolism) ([place]) 11/04/2019   Pneumonia    Proliferative diabetic retinopathy ([place]) 09/29/2015   Pronation deformity of both feet 06/14/2014   Reactive depression 12/10/2017   .   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[place]) 02/17/2018   Thromboembolism ([place]) 04/05/2018   Upper airway cough syndrome, with recs to stay off ACE and take Pepcid q hs 11/15/2016    Patient Active Problem List   Diagnosis Date Noted   S/P BKA (below knee amputation), right ([place]) 03/31/2021   Impaired mobility and ADLs 03/25/2021   CVA (cerebral vascular accident) ([place]) 03/04/2021   Pulmonary embolus ([place]) 03/04/2021   Charcot ankle, right 02/23/2021   Hx of cardiac arrest 11/12/2020   Abnormal CT of the chest 09/26/2020   Allergy, unspecified, initial encounter 09/22/2020   Anaphylactic shock, unspecified, initial encounter 09/22/2020   Fluid  overload, unspecified 06/10/2020   Other mechanical complication of other cardiac and vascular devices and implants, initial encounter ([place]) 05/10/2020   Positive ANA (antinuclear antibody) 04/16/2020   ESRD on dialysis [facility])    Hemiplegia and hemiparesis following cerebral infarction affecting left non-dominant side ([place]) 12/29/2018   IBS (irritable bowel syndrome) 08/06/2017   Dyslipidemia associated with type 2 diabetes  mellitus ([place]) 06/25/2016   Proliferative diabetic retinopathy ([place]) 09/29/2015   Chronic cough 10/04/2014   B12 deficiency 05/10/2014   Anemia in chronic kidney disease 04/26/2014   CKD (chronic kidney disease) stage 5, GFR less than 15 ml/min (HCC) 04/26/2014   Secondary hyperparathyroidism of renal origin ([place]) 11/27/2013   Posterior subcapsular cataract, bilateral 10/18/2013   Diabetic neuropathy, painful ([place]) 07/13/2013   Diabetes mellitus, type II, insulin dependent ([place]), on CGM, with end organ damage: renal, neuropathy, gastroparesis, retinopathy 02/19/2011   Hypertension associated with diabetes ([place]) 02/19/2011   Gastroparesis due to DM ([place]) 02/19/2011    Past Surgical History:  Procedure Laterality Date   A/V FISTULAGRAM Right 03/03/2020   Procedure: Venogram;  Surgeon: Waynetta Sandy, MD;  Location: [place] [facility];  Service: Cardiovascular;  Laterality: Right;   ARTERY REPAIR Right 02/17/2018   Procedure: EXPLORATION OF RIGHT BRACHIAL ARTERY;  Surgeon: Conrad Harrisburg, MD;  Location: [place];  Service: Vascular;  Laterality: Right;   ARTERY REPAIR Right 02/17/2018   Procedure: BRACHIAL ARTERY EXPLORATION AND TRHOMBECTOMY;  Surgeon: Conrad Hiouchi, MD;  Location: [place];  Service: Vascular;  Laterality: Right;   AV FISTULA PLACEMENT Left 05/09/2017   Procedure: INSERTION OF ARTERIOVENOUS (AV) GRAFT ARM (ARTEGRAFT);  Surgeon: Conrad Iola, MD;  Location: [place];  Service: Vascular;  Laterality: Left;   AV FISTULA PLACEMENT Right 02/17/2018   Procedure: INSERTION OF ARTERIOVENOUS (AV) GORE-TEX GRAFT ARM RIGHT UPPER ARM;  Surgeon: Conrad Grundy, MD;  Location: [place];  Service: Vascular;  Laterality: Right;   AV FISTULA PLACEMENT Left 10/10/2019   Procedure: INSERTION OF LEFT ARTERIOVENOUS (AV) ARTEGRAFT GRAFT ARM;  Surgeon: Waynetta Sandy, MD;  Location: [place];  Service: Vascular;  Laterality: Left;   AV FISTULA PLACEMENT Right 03/19/2020   Procedure: INSERTION OF  ARTERIOVENOUS (AV) GORE-TEX GRAFT IN RIGHT ARM AND VIABAHN STENT IN RIGHT AXILLARY ARTERY;  Surgeon: Waynetta Sandy, MD;  Location: [place];  Service: Vascular;  Laterality: Right;   [place] Left 08/31/2016   Procedure: BASILIC VEIN TRANSPOSITION FIRST STAGE;  Surgeon: Conrad Redmond, MD;  Location: [place];  Service: Vascular;  Laterality: Left;   BRONCHIAL WASHINGS  09/17/2021   Procedure: BRONCHIAL WASHINGS;  Surgeon: Collene Gobble, MD;  Location: [facility];  Service: Cardiopulmonary;;   CENTRAL VENOUS CATHETER INSERTION Left 07/24/2018   Procedure: INSERTION CENTRAL LINE ADULT;  Surgeon: Conrad [place]ville, MD;  Location: [place];  Service: Vascular;  Laterality: Left;   EYE SURGERY     secondary to diabetic retinopathy    FOOT SURGERY Right    t"ook bone out- maybe hammer toe"   INSERTION OF DIALYSIS CATHETER Left 07/24/2018   Procedure: INSERTION OF TUNNELED DIALYSIS CATHETER;  Surgeon: Conrad Clifton, MD;  Location: [place];  Service: Vascular;  Laterality: Left;   IR FLUORO GUIDE CV LINE RIGHT  07/21/2018   IR FLUORO GUIDE CV LINE RIGHT  06/01/2019   IR FLUORO GUIDE CV LINE RIGHT  05/05/2020   IR REMOVAL TUN CV CATH W/O FL  01/23/2021   IR US  GUIDE VASC ACCESS RIGHT  07/21/2018   IR US GUIDE VASC ACCESS RIGHT  06/01/2019   LIGATION ARTERIOVENOUS GORTEX GRAFT Right 03/20/2020   Procedure: LIGATION ARM ARTERIOVENOUS GORTEX GRAFT With Patch Angioplasty, Thrombectomy;  Surgeon: Waynetta Sandy, MD;  Location: [place];  Service: Vascular;  Laterality: Right;   LIGATION OF ARTERIOVENOUS  FISTULA Left 05/09/2017   Procedure: LIGATION OF ARTERIOVENOUS  FISTULA;  Surgeon: Conrad Blackshear, MD;  Location: [place];  Service: Vascular;  Laterality: Left;   LOOP RECORDER INSERTION N/A 12/05/2017   Procedure: LOOP RECORDER INSERTION;  Surgeon: Evans Lance, MD;  Location: [place] [facility];  Service: Cardiovascular;  Laterality: N/A;   MYOMECTOMY     REMOVAL OF GRAFT Right 02/17/2018    Procedure: REMOVAL OF RIGHT UPPER ARM ARTERIOVENOUS GRAFT;  Surgeon: Conrad Rice, MD;  Location: [place];  Service: Vascular;  Laterality: Right;   REVISION OF ARTERIOVENOUS GORETEX GRAFT Left 07/24/2018   Procedure: REDO ARTERIOVENOUS GORETEX GRAFT;  Surgeon: Conrad Narrows, MD;  Location: [place];  Service: Vascular;  Laterality: Left;   TEE WITHOUT CARDIOVERSION N/A 12/05/2017   Procedure: TRANSESOPHAGEAL ECHOCARDIOGRAM (TEE);  Surgeon: Sanda Klein, MD;  Location: [place];  Service: Cardiovascular;  Laterality: N/A;   UPPER EXTREMITY VENOGRAPHY Left 07/13/2018   Procedure: UPPER EXTREMITY VENOGRAPHY - Central & Left Arm;  Surgeon: Conrad [place], MD;  Location: [place] [facility];  Service: Cardiovascular;  Laterality: Left;   UPPER EXTREMITY VENOGRAPHY Bilateral 09/10/2019   Procedure: UPPER EXTREMITY VENOGRAPHY;  Surgeon: Waynetta Sandy, MD;  Location: [place] [facility];  Service: Cardiovascular;  Laterality: Bilateral;   UTERINE FIBROID SURGERY     VIDEO BRONCHOSCOPY N/A 09/17/2021   Procedure: VIDEO BRONCHOSCOPY WITHOUT FLUORO;  Surgeon: Collene Gobble, MD;  [facility];  Service: Cardiopulmonary;  Laterality: N/A;   VITRECTOMY Bilateral      OB History     Gravida  2   Para  1   Term      Preterm      AB  1   Living  1      SAB  1   IAB      Ectopic      Multiple      Live Births              Family History  Problem Relation Age of Onset   Colon polyps Mother    Diabetes Mother    Heart murmur Father        history essentially unknown   Hypertension Father    Diabetes Sister    Kidney failure Sister        kidney transplant   Diabetes Brother    Retinal degeneration Brother    Heart murmur Son    Stroke Paternal Grandmother    Diabetes Sister     Social History   Tobacco Use   Smoking status: Never   Smokeless tobacco: Never  Vaping Use   Vaping Use: Never used  Substance Use Topics   Alcohol use: No     Alcohol/week: 0.0 standard drinks   Drug use: No    Home Medications Prior to Admission medications   Medication Sig Start Date End Date Taking? Authorizing Provider  cephALEXin (KEFLEX) 500 MG capsule Take 1 capsule (500 mg total) by mouth 2 (two) times daily for 10 days. After dialysis on dialysis days. 10/16/21 10/26/21 Yes Gareth Morgan, MD  promethazine (PHENERGAN) 25 MG tablet  Take 1 tablet (25 mg total) by mouth every 6 (six) hours as needed for nausea or vomiting. 10/16/21  Yes Gareth Morgan, MD  benzonatate (TESSALON PERLES) 100 MG capsule Take 1 capsule (100 mg total) by mouth every 6 (six) hours as needed for cough. Patient not taking: Reported on 09/29/2021 09/25/21 09/25/22  Fransico Meadow, PA-C  calcium acetate (PHOSLO) 667 MG capsule Take 667 mg by mouth 3 (three) times daily with meals. 08/25/21   [provider]  Continuous Blood Gluc Sensor (FREESTYLE LIBRE 14 DAY SENSOR) MISC 1 patch by Does not apply route every 14 (fourteen) days. 07/10/21   [name], MD  gabapentin (NEURONTIN) 300 MG capsule Take 1 capsule (300 mg total) by mouth 3 (three) times daily. Take after dialysis. Do not take more than three times weekly. 09/17/21   Collene Gobble, MD  HYDROcodone-acetaminophen (NORCO/VICODIN) 5-325 MG tablet Take 1 tablet by mouth every 4 (four) hours as needed for moderate pain. 09/25/21 09/25/22  Fransico Meadow, PA-C  insulin aspart (NOVOLOG FLEXPEN) 100 UNIT/ML FlexPen Inject 6 Units into the skin 3 (three) times daily with meals. Sliding scale 07/16/20   [name], MD  insulin degludec (TRESIBA FLEXTOUCH) 100 UNIT/ML SOPN FlexTouch Pen Inject 0.26 mLs (26 Units total) into the skin at bedtime. 12/26/19   Shamleffer, Melanie Crazier, MD  Insulin Pen Needle 29G X 5MM MISC 1 Device by Does not apply route 4 (four) times daily. 12/26/19   Shamleffer, Melanie Crazier, MD  midodrine (PROAMATINE) 5 MG tablet Take 1 tablet (5 mg total) by mouth See admin  instructions. Given at Dialysis if needed 09/17/21   Collene Gobble, MD  nystatin-triamcinolone ointment [facility]) Apply 1 application topically daily as needed (sore on leg). 09/17/21   Collene Gobble, MD    Allergies    Ibuprofen, Dilaudid [hydromorphone hcl], Iodinated diagnostic agents, and Tramadol  Review of Systems   Review of Systems  Constitutional:  Negative for fever.  HENT:  Positive for congestion (chronic one month). Negative for sore throat.   Eyes:  Negative for visual disturbance.  Respiratory:  Negative for cough and shortness of breath.   Cardiovascular:  Negative for chest pain.  Gastrointestinal:  Positive for abdominal pain, nausea and vomiting. Negative for constipation and diarrhea.  Genitourinary:  Negative for difficulty urinating.  Musculoskeletal:  Negative for back pain and neck pain.  Skin:  Negative for rash.  Neurological:  Negative for syncope and headaches.   Physical Exam Updated Vital Signs BP (!) 123/55   Pulse 80   Temp 98 F (36.7 C) (Oral)   Resp 14   LMP  (LMP Unknown)   SpO2 95%   Physical Exam Vitals and nursing note reviewed.  Constitutional:      General: [name] not in acute distress.    Appearance: [name] well-developed. [name] not diaphoretic.  HENT:     Head: Normocephalic and atraumatic.  Eyes:     Conjunctiva/sclera: Conjunctivae normal.  Cardiovascular:     Rate and Rhythm: Normal rate and regular rhythm.     Heart sounds: Normal heart sounds. No murmur heard.   No friction rub. No gallop.  Pulmonary:     Effort: Pulmonary effort is normal. No respiratory distress.     Breath sounds: Normal breath sounds. No wheezing or rales.  Abdominal:     General: There is no distension.     Palpations: Abdomen is soft.     Tenderness: There  is abdominal tenderness in the right upper quadrant, epigastric area, left upper quadrant and left lower quadrant. There is no guarding. Negative signs include Murphy's sign.   Musculoskeletal:        General: No tenderness.     Cervical back: Normal range of motion.  Skin:    General: Skin is warm and dry.     Findings: No erythema or rash.  Neurological:     Mental Status: [name] alert and oriented to person, place, and time.    ED Results / Procedures / Treatments   Labs (all labs ordered are listed, but only abnormal results are displayed) Labs Reviewed  CBC WITH DIFFERENTIAL/PLATELET - Abnormal; Notable for the following components:      Result Value   WBC 10.9 (*)    RBC 2.87 (*)    Hemoglobin 8.8 (*)    HCT 29.5 (*)    MCV 102.8 (*)    MCHC 29.8 (*)    nRBC 0.7 (*)    Neutro Abs 8.7 (*)    Abs Immature Granulocytes 0.12 (*)    All other components within normal limits  COMPREHENSIVE METABOLIC PANEL - Abnormal; Notable for the following components:   Glucose, Bld 237 (*)    BUN 25 (*)    Creatinine, Ser 9.01 (*)    Total Protein 8.7 (*)    Albumin 3.4 (*)    GFR, Estimated 5 (*)    All other components within normal limits  I-STAT BETA HCG BLOOD, ED ([facility], [facility], [facility] ONLY) - Abnormal; Notable for the following components:   I-stat hCG, quantitative 6.1 (*)    All other components within normal limits  URINE CULTURE  LIPASE, BLOOD    EKG EKG Interpretation  Date/Time:  Friday October 16 2021 13:28:02 EDT Ventricular Rate:  99 PR Interval:  136 QRS Duration: 76 QT Interval:  362 QTC Calculation: 464 R Axis:   40 Text Interpretation: Normal sinus rhythm Low voltage QRS Borderline ECG No significant change since last tracing Confirmed by Gareth Morgan [phone]) on 10/16/2021 5:11:02 PM  Radiology CT Abdomen Pelvis Wo Contrast  Result Date: 10/16/2021 CLINICAL DATA:  Nausea, vomiting, and left lower quadrant pain. On dialysis. EXAM: CT ABDOMEN AND PELVIS WITHOUT CONTRAST TECHNIQUE: Multidetector CT imaging of the abdomen and pelvis was performed following the standard protocol without IV contrast. COMPARISON:  CT abdomen and pelvis  02/05/2019.  CT chest 07/29/2021. FINDINGS: Lower chest: Mild atelectasis in the lung bases scattered tree in bud densities in the lung bases as also seen on the recent chest CT which may reflect chronic atypical infection. Small pericardial effusion, mildly decreased in size from the prior chest CT. Coronary atherosclerosis. No pleural effusion. Hepatobiliary: No focal liver abnormality is seen. No gallstones, gallbladder wall thickening, or biliary dilatation. Pancreas: Unremarkable. Spleen: Unremarkable. Adrenals/Urinary Tract: Unremarkable adrenal glands. Bilateral renal atrophy. Punctate calcifications in both renal hila which are at least partly vascular with coexistent punctate nonobstructing collecting system calculi also possible. Slight fullness of the left renal pelvis and proximal left ureter with decompressed distal ureter and no ureteral stone identified. Mild bladder wall thickening versus underdistention. Small amount of nondependent gas in the bladder. Stomach/Bowel: The stomach is unremarkable. There is no evidence of bowel obstruction or inflammation. Unremarkable appendix. Vascular/Lymphatic: Abdominal aortic atherosclerosis without aneurysm. No enlarged lymph nodes. Reproductive: Uterus and bilateral adnexa are unremarkable. Other: No ascites or pneumoperitoneum. Musculoskeletal: Unchanged mild T7 superior endplate compression fracture/Schmorl's node deformity. S shaped thoracolumbar scoliosis. IMPRESSION:  1. Mild bladder wall thickening versus underdistention with small amount of nondependent gas in the bladder. Correlate for recent instrumentation and signs of cystitis. 2. Slight fullness of the left renal pelvis and proximal left ureter without an obstructing stone identified. Correlate for signs of upper tract infection. 3. Bilateral renal atrophy with vascular calcifications and possible punctate nonobstructing calculi. 4. Small pericardial effusion. 5. Aortic Atherosclerosis (ICD10-I70.0).  Electronically Signed   By: Logan Bores M.D.   On: 10/16/2021 14:40    Procedures Procedures   Medications Ordered in ED Medications  fentaNYL (SUBLIMAZE) injection 100 mcg (100 mcg Intramuscular Given 10/16/21 1741)  diphenhydrAMINE (BENADRYL) capsule 25 mg (25 mg Oral Given 10/16/21 1850)    ED Course  I have reviewed the triage vital signs and the nursing notes.  Pertinent labs & imaging results that were available during my care of the patient were reviewed by me and considered in my medical decision making (see chart for details).    MDM Rules/Calculators/A&P                           50 year old female with a history of CVA, diabetes type 2, ESRD on dialysis, hypertension, pancreatitis, pulmonary embolus, right BKA who presents with concern for abdominal pain.  Hcg 6.1, suspect likely lab abnormality rather than pregnancy.  No vaginal discharge, pelvic symptoms, history not consistent with ovarian torsion , TOA, PID.  Labs show no sign of pancreatitis.  Electrolytes with mild hyperglycemia, no acidosis, Cr consistent with dialysis.   Slight fullness of left renal pelvis, proximal left ureter without obstructing stone identified, as well as mild bladder wall thickening with small amount of nondependent gas.  She does not make much urine and denies urinary symptoms, however givne nausea, vomiting, location of pain in left upper abdomen/flank I feel this combination of findings is consistent with likely UTI/pyelonephritis. Ordered urine culture.  Doubt cardiac etiology given no chest pain, no dyspnea, no ECG changes and reproducible abdominal pain on exam.  Will initiate antibiotics for suspected UTI and gave follow up with urology given proximal ureteral dilation.  Discussed strict return precautions. Patient discharged in stable condition with understanding of reasons to return.    Final Clinical Impression(s) / ED Diagnoses Final diagnoses:  Pyelonephritis  Left upper quadrant  abdominal pain  Nausea and vomiting, unspecified vomiting type    Rx / DC Orders ED Discharge Orders          Ordered    promethazine (PHENERGAN) 25 MG tablet  Every 6 hours PRN        10/16/21 1955    cephALEXin (KEFLEX) 500 MG capsule  2 times daily        10/16/21 1955             Gareth Morgan, MD 10/17/21 2352

## 2021-10-16 NOTE — ED Triage Notes (Signed)
Pt with LLQ abdominal pain with n/v onset this morning. Denies diarrhea. 4 mg zofran and 50 mcg Fentanyl given PTA. T, Th, S dialysis patient, has not missed any tx.

## 2021-10-16 NOTE — ED Notes (Signed)
Received verbal report from Sarah W RN at this time 

## 2021-10-20 LAB — FUNGUS CULTURE RESULT

## 2021-10-20 LAB — FUNGUS CULTURE WITH STAIN

## 2021-10-20 LAB — FUNGAL ORGANISM REFLEX

## 2021-10-22 ENCOUNTER — Ambulatory Visit: Payer: Medicare Other | Admitting: Sports Medicine

## 2021-10-27 ENCOUNTER — Other Ambulatory Visit: Payer: Self-pay

## 2021-10-27 ENCOUNTER — Encounter: Payer: Self-pay | Admitting: Obstetrics and Gynecology

## 2021-10-27 ENCOUNTER — Ambulatory Visit (INDEPENDENT_AMBULATORY_CARE_PROVIDER_SITE_OTHER): Payer: Medicare Other | Admitting: Obstetrics and Gynecology

## 2021-10-27 VITALS — HR 88

## 2021-10-27 DIAGNOSIS — N898 Other specified noninflammatory disorders of vagina: Secondary | ICD-10-CM | POA: Diagnosis not present

## 2021-10-27 DIAGNOSIS — R232 Flushing: Secondary | ICD-10-CM

## 2021-10-27 DIAGNOSIS — T8332XA Displacement of intrauterine contraceptive device, initial encounter: Secondary | ICD-10-CM | POA: Diagnosis not present

## 2021-10-27 DIAGNOSIS — N939 Abnormal uterine and vaginal bleeding, unspecified: Secondary | ICD-10-CM | POA: Diagnosis not present

## 2021-10-27 DIAGNOSIS — R6882 Decreased libido: Secondary | ICD-10-CM

## 2021-10-27 LAB — WET PREP FOR TRICH, YEAST, CLUE

## 2021-10-27 NOTE — Progress Notes (Signed)
GYNECOLOGY  VISIT   HPI: 50 y.o.   Married Black or [place] American Not Hispanic or Latino  female   [phone] with No LMP recorded. (Menstrual status: IUD).   here for irregular bleeding with and IUD She had a cycle last month. [name] also having white discharge for many months. No itching, burning, irritation or odor. She hasn't been sexually for a couple of years. Her libido has been so bad.  She has been having hot flashes recently. Mild night sweats.  She had a mirnea IUD inserted in 8/20 for contraception and control of AUB. U/S prior to insertion showed several small myomas (not in the cavity). Endometrial biopsy with proliferative endometrium.   Last pap 07/29/17 negative, negative hpv  Since the IUD was inserted she had no bleeding until last month. She bleed heavily for 3 days in early to mid October. She was changing her pad within an hour.   10/16/21 CBC with Hgb of 8.8.  07/29/21 Hgb was 9.5.   She reports a normal colonoscopy last year.   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 She has diabetes and stage 5 chronic kidney disease. Getting dialysis.   [name] still working from home 3 days a week from home. Works with [employer].   Chart reviewed, including recent visits, her BKA, and lab work.  GYNECOLOGIC HISTORY: No LMP recorded. (Menstrual status: IUD). Contraception:IUD  Menopausal hormone therapy: none        OB History     Gravida  2   Para  1   Term      Preterm      AB  1   Living  1      SAB  1   IAB      Ectopic      Multiple      Live Births                 Patient Active Problem List   Diagnosis Date Noted   S/P BKA (below knee amputation), right ([place]) 03/31/2021   Impaired mobility and ADLs 03/25/2021   CVA (cerebral vascular accident) ([place]) 03/04/2021   Pulmonary embolus ([place]) 03/04/2021   Charcot ankle, right 02/23/2021   Hx of cardiac arrest 11/12/2020   Abnormal CT of the chest  09/26/2020   Allergy, unspecified, initial encounter 09/22/2020   Anaphylactic shock, unspecified, initial encounter 09/22/2020   Fluid overload, unspecified 06/10/2020   Other mechanical complication of other cardiac and vascular devices and implants, initial encounter ([place]) 05/10/2020   Positive ANA (antinuclear antibody) 04/16/2020   ESRD on dialysis [facility])    Hemiplegia and hemiparesis following cerebral infarction affecting left non-dominant side ([place]) 12/29/2018   IBS (irritable bowel syndrome) 08/06/2017   Dyslipidemia associated with type 2 diabetes mellitus ([place]) 06/25/2016   Proliferative diabetic retinopathy ([place]) 09/29/2015   Chronic cough 10/04/2014   B12 deficiency 05/10/2014   Anemia in chronic kidney disease 04/26/2014   CKD (chronic kidney disease) stage 5, GFR less than 15 ml/min (HCC) 04/26/2014   Secondary hyperparathyroidism of renal origin ([place]) 11/27/2013   Posterior subcapsular cataract, bilateral 10/18/2013   Diabetic neuropathy, painful ([place]) 07/13/2013   Diabetes mellitus, type II, insulin dependent ([place]), on CGM, with end organ damage: renal, neuropathy, gastroparesis, retinopathy 02/19/2011   Hypertension associated with diabetes ([place]) 02/19/2011   Gastroparesis due to DM ([place]) 02/19/2011    Past Medical History:  Diagnosis Date   Antiplatelet or antithrombotic  long-term use: Plavix 06/30/2017   B12 deficiency 05/10/2014   Blood transfusion without reported diagnosis    Chronic constipation    CVA (cerebral vascular accident) ([place]), nonhemorrhagic, inferior right cerebellum 12/03/2017   left side weakness in leg   Cyst of left ovary 01/12/2018   Diabetic neuropathy, painful ([place]), on low dose Gabapentin 07/13/2013   DM (diabetes mellitus), type 2, uncontrolled, with renal complications ([place]) 29/92/4268   DM2 (diabetes mellitus, type 2) ([place])    Dysfunction of left eustachian tube, with pusatile tinnitus 11/24/2017   Dyslipidemia associated with type 2  diabetes mellitus ([place]) 06/25/2016   ESRD (end stage renal disease) on dialysis [facility])    On hemodialysis in July 2019 via [facility] then switched to [facility] in Nov 2019.    ESRD with anemia (HCC)    Fibromyalgia    Gastroparesis due to DM    GERD (gastroesophageal reflux disease)    Headache    Hypertension associated with diabetes ([place]) 02/19/2011   IBS (irritable bowel syndrome)    Left-sided weakness 12/10/2017   .   Leiomyoma of uterus    Pancreatitis    PE (pulmonary thromboembolism) ([place]) 11/04/2019   Pneumonia    Proliferative diabetic retinopathy ([place]) 09/29/2015   Pronation deformity of both feet 06/14/2014   Reactive depression 12/10/2017   .   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[place]) 02/17/2018   Thromboembolism ([place]) 04/05/2018   Upper airway cough syndrome, with recs to stay off ACE and take Pepcid q hs 11/15/2016    Past Surgical History:  Procedure Laterality Date   A/V FISTULAGRAM Right 03/03/2020   Procedure: Venogram;  Surgeon: Waynetta Sandy, MD;  Location: [place] [facility];  Service: Cardiovascular;  Laterality: Right;   ARTERY REPAIR Right 02/17/2018   Procedure: EXPLORATION OF RIGHT BRACHIAL ARTERY;  Surgeon: Conrad Sheridan, MD;  Location: [place];  Service: Vascular;  Laterality: Right;   ARTERY REPAIR Right 02/17/2018   Procedure: BRACHIAL ARTERY EXPLORATION AND TRHOMBECTOMY;  Surgeon: Conrad Doyline, MD;  Location: [place];  Service: Vascular;  Laterality: Right;   AV FISTULA PLACEMENT Left 05/09/2017   Procedure: INSERTION OF ARTERIOVENOUS (AV) GRAFT ARM (ARTEGRAFT);  Surgeon: Conrad Ferrysburg, MD;  Location: [facility];  Service: Vascular;  Laterality: Left;   AV FISTULA PLACEMENT Right 02/17/2018   Procedure: INSERTION OF ARTERIOVENOUS (AV) GORE-TEX GRAFT ARM RIGHT UPPER ARM;  Surgeon: Conrad Montier, MD;  Location: [name];  Service: Vascular;  Laterality: Right;   AV FISTULA  PLACEMENT Left 10/10/2019   Procedure: INSERTION OF LEFT ARTERIOVENOUS (AV) ARTEGRAFT GRAFT ARM;  Surgeon: Waynetta Sandy, MD;  Location: [place];  Service: Vascular;  Laterality: Left;   AV FISTULA PLACEMENT Right 03/19/2020   Procedure: INSERTION OF ARTERIOVENOUS (AV) GORE-TEX GRAFT IN RIGHT ARM AND VIABAHN STENT IN RIGHT AXILLARY ARTERY;  Surgeon: Waynetta Sandy, MD;  Location: [place];  Service: Vascular;  Laterality: Right;   [place] Left 08/31/2016   Procedure: BASILIC VEIN TRANSPOSITION FIRST STAGE;  Surgeon: Conrad Akiachak, MD;  Location: [place];  Service: Vascular;  Laterality: Left;   BRONCHIAL WASHINGS  09/17/2021   Procedure: BRONCHIAL WASHINGS;  Surgeon: Collene Gobble, MD;  Location: [facility];  Service: Cardiopulmonary;;   CENTRAL VENOUS CATHETER INSERTION Left 07/24/2018   Procedure: INSERTION CENTRAL LINE ADULT;  Surgeon: Conrad , MD;  Location: [place];  Service:  Vascular;  Laterality: Left;   EYE SURGERY     secondary to diabetic retinopathy    FOOT SURGERY Right    t"ook bone out- maybe hammer toe"   INSERTION OF DIALYSIS CATHETER Left 07/24/2018   Procedure: INSERTION OF TUNNELED DIALYSIS CATHETER;  Surgeon: Conrad Woodbury, MD;  Location: [place];  Service: Vascular;  Laterality: Left;   IR FLUORO GUIDE CV LINE RIGHT  07/21/2018   IR FLUORO GUIDE CV LINE RIGHT  06/01/2019   IR FLUORO GUIDE CV LINE RIGHT  05/05/2020   IR REMOVAL TUN CV CATH W/O FL  01/23/2021   IR US GUIDE VASC ACCESS RIGHT  07/21/2018   IR US GUIDE VASC ACCESS RIGHT  06/01/2019   LIGATION ARTERIOVENOUS GORTEX GRAFT Right 03/20/2020   Procedure: LIGATION ARM ARTERIOVENOUS GORTEX GRAFT With Patch Angioplasty, Thrombectomy;  Surgeon: Waynetta Sandy, MD;  Location: [place];  Service: Vascular;  Laterality: Right;   LIGATION OF ARTERIOVENOUS  FISTULA Left 05/09/2017   Procedure: LIGATION OF ARTERIOVENOUS  FISTULA;  Surgeon: Conrad Crayne, MD;  Location: [place];  Service:  Vascular;  Laterality: Left;   LOOP RECORDER INSERTION N/A 12/05/2017   Procedure: LOOP RECORDER INSERTION;  Surgeon: Evans Lance, MD;  Location: [place] [facility];  Service: Cardiovascular;  Laterality: N/A;   MYOMECTOMY     REMOVAL OF GRAFT Right 02/17/2018   Procedure: REMOVAL OF RIGHT UPPER ARM ARTERIOVENOUS GRAFT;  Surgeon: Conrad Cabot, MD;  Location: [place];  Service: Vascular;  Laterality: Right;   REVISION OF ARTERIOVENOUS GORETEX GRAFT Left 07/24/2018   Procedure: REDO ARTERIOVENOUS GORETEX GRAFT;  Surgeon: Conrad Valhalla, MD;  Location: [place];  Service: Vascular;  Laterality: Left;   TEE WITHOUT CARDIOVERSION N/A 12/05/2017   Procedure: TRANSESOPHAGEAL ECHOCARDIOGRAM (TEE);  Surgeon: Sanda Klein, MD;  Location: [place];  Service: Cardiovascular;  Laterality: N/A;   UPPER EXTREMITY VENOGRAPHY Left 07/13/2018   Procedure: UPPER EXTREMITY VENOGRAPHY - Central & Left Arm;  Surgeon: Conrad Lincoln, MD;  Location: [place] [facility];  Service: Cardiovascular;  Laterality: Left;   UPPER EXTREMITY VENOGRAPHY Bilateral 09/10/2019   Procedure: UPPER EXTREMITY VENOGRAPHY;  Surgeon: Waynetta Sandy, MD;  Location: [place] [facility];  Service: Cardiovascular;  Laterality: Bilateral;   UTERINE FIBROID SURGERY     VIDEO BRONCHOSCOPY N/A 09/17/2021   Procedure: VIDEO BRONCHOSCOPY WITHOUT FLUORO;  Surgeon: Collene Gobble, MD;  [facility];  Service: Cardiopulmonary;  Laterality: N/A;   VITRECTOMY Bilateral     Current Outpatient Medications  Medication Sig Dispense Refill   calcium acetate (PHOSLO) 667 MG capsule Take 667 mg by mouth 3 (three) times daily with meals.     Continuous Blood Gluc Sensor (FREESTYLE LIBRE 14 DAY SENSOR) MISC 1 patch by Does not apply route every 14 (fourteen) days. 6 each 5   gabapentin (NEURONTIN) 300 MG capsule Take 1 capsule (300 mg total) by mouth 3 (three) times daily. Take after dialysis. Do not take more than three times weekly.      insulin aspart (NOVOLOG FLEXPEN) 100 UNIT/ML FlexPen Inject 6 Units into the skin 3 (three) times daily with meals. Sliding scale     insulin degludec (TRESIBA FLEXTOUCH) 100 UNIT/ML SOPN FlexTouch Pen Inject 0.26 mLs (26 Units total) into the skin at bedtime. 24 mL 3   Insulin Pen Needle 29G X 5MM MISC 1 Device by Does not apply route 4 (four) times daily. 400 each 3   midodrine (PROAMATINE) 5  MG tablet Take 1 tablet (5 mg total) by mouth See admin instructions. Given at Dialysis if needed     No current facility-administered medications for this visit.     ALLERGIES: Ibuprofen, Dilaudid [hydromorphone hcl], Iodinated diagnostic agents, and Tramadol  Family History  Problem Relation Age of Onset   Colon polyps Mother    Diabetes Mother    Heart murmur Father        history essentially unknown   Hypertension Father    Diabetes Sister    Kidney failure Sister        kidney transplant   Diabetes Brother    Retinal degeneration Brother    Heart murmur Son    Stroke Paternal Grandmother    Diabetes Sister     Social History   Socioeconomic History   Marital status: Married    Spouse name: Not on file   Number of children: 1   Years of education: college   Highest education level: Not on file  Occupational History   Occupation: customer service rep    Employer: [employer]  Tobacco Use   Smoking status: Never   Smokeless tobacco: Never  Vaping Use   Vaping Use: Never used  Substance and Sexual Activity   Alcohol use: No    Alcohol/week: 0.0 standard drinks   Drug use: No   Sexual activity: Not Currently    Partners: Male    Birth control/protection: I.U.D.  Other Topics Concern   Not on file  Social History Narrative   Patient lives with fiance. She has 1 grown son. Works full-time, she Paramedic for attorney.   Caffeine Use: 2 cups daily; sodas occasionally   She has 7 grandchildren   Social Determinants of Radio broadcast assistant Strain: Not on  file  Food Insecurity: Not on file  Transportation Needs: Not on file  Physical Activity: Not on file  Stress: Not on file  Social Connections: Not on file  Intimate Partner Violence: Not on file    Review of Systems  All other systems reviewed and are negative.  PHYSICAL EXAMINATION:    Pulse 88   SpO2 100%     General appearance: alert, cooperative and appears stated age Neck: no adenopathy, supple, symmetrical, trachea midline and thyroid normal to inspection and palpation Abdomen: soft, non-tender; non distended, no masses,  no organomegaly  Pelvic: External genitalia:  no lesions              Urethra:  normal appearing urethra with no masses, tenderness or lesions              Bartholins and Skenes: normal                 Vagina: normal appearing vagina with a slight increase in creamy, white vaginal discharge              Cervix: no cervical motion tenderness, no lesions, and IUD string not seen. Unable to tease the IUD out of the cervix with a cytobrush.               Bimanual Exam:  Uterus:   no masses or tenderness, exam limited by BMI              Adnexa: no mass, fullness, tenderness               Chaperone was present for exam.  1. Abnormal uterine bleeding Had a mirena IUD placed in 8/20. No  bleeding since IUD insertion until last month at which point she bleed heavily for 3 days. IUD strings not seen today. She has chronic anemia (will f/u with primary/nephrologist) - TSH - Follicle stimulating hormone - US PELVIS TRANSVAGINAL NON-OB (TV ONLY); Future  2. Hot flashes - Follicle stimulating hormone  3. Intrauterine contraceptive device threads lost, initial encounter - US PELVIS TRANSVAGINAL NON-OB (TV ONLY); Future  4. Vaginal discharge - WET PREP FOR TRICH, YEAST, CLUE - SureSwab Advanced Vaginitis, TMA  5. Low libido Discussed the multiple causes for low libido. Given her many medical issues I would anticipate that she would have a low libido.   Over  30 minutes in total patient care.

## 2021-10-28 ENCOUNTER — Ambulatory Visit (INDEPENDENT_AMBULATORY_CARE_PROVIDER_SITE_OTHER): Payer: Medicare Other | Admitting: Physician Assistant

## 2021-10-28 ENCOUNTER — Encounter: Payer: Self-pay | Admitting: Physician Assistant

## 2021-10-28 ENCOUNTER — Other Ambulatory Visit: Payer: Self-pay

## 2021-10-28 VITALS — BP 110/66 | HR 86 | Temp 98.3°F

## 2021-10-28 DIAGNOSIS — R059 Cough, unspecified: Secondary | ICD-10-CM

## 2021-10-28 LAB — POC COVID19 BINAXNOW: SARS Coronavirus 2 Ag: NEGATIVE

## 2021-10-28 LAB — SURESWAB® ADVANCED VAGINITIS,TMA
CANDIDA SPECIES: NOT DETECTED
Candida glabrata: NOT DETECTED
SURESWAB(R) ADV BACTERIAL VAGINOSIS(BV),TMA: POSITIVE — AB
TRICHOMONAS VAGINALIS (TV),TMA: NOT DETECTED

## 2021-10-28 LAB — POC INFLUENZA A&B (BINAX/QUICKVUE)
Influenza A, POC: NEGATIVE
Influenza B, POC: NEGATIVE

## 2021-10-28 MED ORDER — METRONIDAZOLE 0.75 % VA GEL: VAGINAL | 0 refills | Status: DC

## 2021-10-28 MED ORDER — DOXYCYCLINE HYCLATE 100 MG PO TABS: 100.0000 mg | ORAL_TABLET | Freq: Two times a day (BID) | ORAL | 0 refills | Status: DC

## 2021-10-29 ENCOUNTER — Encounter: Payer: Self-pay | Admitting: Physician Assistant

## 2021-10-29 NOTE — Progress Notes (Signed)
Virtual Visit via Video   I connected with Heather [name] on 10/29/21 at  4:30 PM EDT by a video enabled telemedicine application and verified that I am speaking with the correct person using two identifiers. Location patient: Home Location provider: [facility], Office Persons participating in the virtual visit: Koreen, Lizaola PA-C  I discussed the limitations of evaluation and management by telemedicine and the availability of in person appointments. The patient expressed understanding and agreed to proceed.   Subjective:   HPI:   Patient reports dry cough x 1 week ago. Has intermittent chills. Nasal congestion. Her husband is also sick. Denies: fevers, nausea, vomiting. [name] ESRD and has had COVID in the past. States that she is eating and drinking well. Cough is worsening with time.  ROS: See pertinent positives and negatives per HPI.  Patient Active Problem List   Diagnosis Date Noted   S/P BKA (below knee amputation), right ([place]) 03/31/2021   Impaired mobility and ADLs 03/25/2021   CVA (cerebral vascular accident) ([place]) 03/04/2021   Pulmonary embolus ([place]) 03/04/2021   Charcot ankle, right 02/23/2021   Hx of cardiac arrest 11/12/2020   Abnormal CT of the chest 09/26/2020   Allergy, unspecified, initial encounter 09/22/2020   Anaphylactic shock, unspecified, initial encounter 09/22/2020   Fluid overload, unspecified 06/10/2020   Other mechanical complication of other cardiac and vascular devices and implants, initial encounter ([place]) 05/10/2020   Positive ANA (antinuclear antibody) 04/16/2020   ESRD on dialysis [facility])    Hemiplegia and hemiparesis following cerebral infarction affecting left non-dominant side ([place]) 12/29/2018   IBS (irritable bowel syndrome) 08/06/2017   Dyslipidemia associated with type 2 diabetes mellitus ([place]) 06/25/2016   Proliferative diabetic retinopathy ([place]) 09/29/2015   Chronic cough 10/04/2014   B12 deficiency  05/10/2014   Anemia in chronic kidney disease 04/26/2014   CKD (chronic kidney disease) stage 5, GFR less than 15 ml/min (HCC) 04/26/2014   Secondary hyperparathyroidism of renal origin ([place]) 11/27/2013   Posterior subcapsular cataract, bilateral 10/18/2013   Diabetic neuropathy, painful ([place]) 07/13/2013   Diabetes mellitus, type II, insulin dependent ([place]), on CGM, with end organ damage: renal, neuropathy, gastroparesis, retinopathy 02/19/2011   Hypertension associated with diabetes ([place]) 02/19/2011   Gastroparesis due to DM ([place]) 02/19/2011    Social History   Tobacco Use   Smoking status: Never   Smokeless tobacco: Never  Substance Use Topics   Alcohol use: No    Alcohol/week: 0.0 standard drinks    Current Outpatient Medications:    calcium acetate (PHOSLO) 667 MG capsule, Take 667 mg by mouth 3 (three) times daily with meals., Disp: , Rfl:    Continuous Blood Gluc Sensor (FREESTYLE LIBRE 14 DAY SENSOR) MISC, 1 patch by Does not apply route every 14 (fourteen) days., Disp: 6 each, Rfl: 5   doxycycline (VIBRA-TABS) 100 MG tablet, Take 1 tablet (100 mg total) by mouth 2 (two) times daily., Disp: 20 tablet, Rfl: 0   gabapentin (NEURONTIN) 300 MG capsule, Take 1 capsule (300 mg total) by mouth 3 (three) times daily. Take after dialysis. Do not take more than three times weekly., Disp: , Rfl:    insulin aspart (NOVOLOG FLEXPEN) 100 UNIT/ML FlexPen, Inject 6 Units into the skin 3 (three) times daily with meals. Sliding scale, Disp: , Rfl:    insulin degludec (TRESIBA FLEXTOUCH) 100 UNIT/ML SOPN FlexTouch Pen, Inject 0.26 mLs (26 Units total) into the skin at bedtime., Disp: 24 mL, Rfl: 3  Insulin Pen Needle 29G X 5MM MISC, 1 Device by Does not apply route 4 (four) times daily., Disp: 400 each, Rfl: 3   metroNIDAZOLE (METROGEL) 0.75 % vaginal gel, Insert applicatorful hs x 5 nights., Disp: 70 g, Rfl: 0   midodrine (PROAMATINE) 5 MG tablet, Take 1 tablet (5 mg total) by mouth See admin  instructions. Given at Dialysis if needed, Disp: , Rfl:   Allergies  Allergen Reactions   Ibuprofen Other (See Comments)    CKD stage 3. Should avoid.   Dilaudid [Hydromorphone Hcl] Itching and Other (See Comments)    Can take with Benadryl.   Iodinated Diagnostic Agents Rash   Tramadol Itching    Objective:   VITALS: Per patient if applicable, see vitals. GENERAL: Alert, appears well and in no acute distress. HEENT: Atraumatic, conjunctiva clear, no obvious abnormalities on inspection of external nose and ears. NECK: Normal movements of the head and neck. CARDIOPULMONARY: No increased WOB. Speaking in clear sentences. I:E ratio WNL.  MS: Moves all visible extremities without noticeable abnormality. PSYCH: Pleasant and cooperative, well-groomed. Speech normal rate and rhythm. Affect is appropriate. Insight and judgement are appropriate. Attention is focused, linear, and appropriate.  NEURO: CN grossly intact. Oriented as arrived to appointment on time with no prompting. Moves both UE equally.  SKIN: No obvious lesions, wounds, erythema, or cyanosis noted on face or hands.  Results for orders placed or performed in visit on 10/28/21  POC Influenza A&B(BINAX/QUICKVUE)  Result Value Ref Range   Influenza A, POC Negative Negative   Influenza B, POC Negative Negative  POC COVID-19  Result Value Ref Range   SARS Coronavirus 2 Ag Negative Negative   *Note: Due to a large number of results and/or encounters for the requested time period, some results have not been displayed. A complete set of results can be found in Results Review.   Vitals:   10/28/21 1556  BP: 110/66  Pulse: 86  Temp: 98.3 F (36.8 C)   Lung and heart exam was performed today and was WNL  Assessment and Plan:   Leana was seen today for cough and nasal congestion.  Diagnoses and all orders for this visit:  Cough, unspecified type -     POC Influenza A&B(BINAX/QUICKVUE) -     POC COVID-19  Other  orders -     doxycycline (VIBRA-TABS) 100 MG tablet; Take 1 tablet (100 mg total) by mouth 2 (two) times daily.  No red flags on exam.  Will initiate oral doxycycline for sinusitis per orders. Discussed taking medications as prescribed. Reviewed return precautions including worsening fever, SOB, worsening cough or other concerns. Push fluids and rest. I recommend that patient follow-up if symptoms worsen or persist despite treatment x 7-10 days, sooner if needed.   I discussed the assessment and treatment plan with the patient. The patient was provided an opportunity to ask questions and all were answered. The patient agreed with the plan and demonstrated an understanding of the instructions.   The patient was advised to call back or seek an in-person evaluation if the symptoms worsen or if the condition fails to improve as anticipated.   CMA or LPN served as scribe during this visit. History, Physical, and Plan performed by medical provider. The above documentation has been reviewed and is accurate and complete.   [place], [place] 10/29/2021

## 2021-11-03 ENCOUNTER — Encounter: Payer: Self-pay | Admitting: Obstetrics and Gynecology

## 2021-11-03 ENCOUNTER — Other Ambulatory Visit: Payer: Self-pay

## 2021-11-03 ENCOUNTER — Telehealth: Payer: Self-pay | Admitting: *Deleted

## 2021-11-03 ENCOUNTER — Ambulatory Visit (INDEPENDENT_AMBULATORY_CARE_PROVIDER_SITE_OTHER): Payer: Medicare Other

## 2021-11-03 ENCOUNTER — Ambulatory Visit (INDEPENDENT_AMBULATORY_CARE_PROVIDER_SITE_OTHER): Payer: Medicare Other | Admitting: Obstetrics and Gynecology

## 2021-11-03 VITALS — BP 120/76 | HR 80

## 2021-11-03 DIAGNOSIS — T8332XA Displacement of intrauterine contraceptive device, initial encounter: Secondary | ICD-10-CM

## 2021-11-03 DIAGNOSIS — N939 Abnormal uterine and vaginal bleeding, unspecified: Secondary | ICD-10-CM

## 2021-11-03 DIAGNOSIS — T8332XD Displacement of intrauterine contraceptive device, subsequent encounter: Secondary | ICD-10-CM | POA: Diagnosis not present

## 2021-11-03 NOTE — Telephone Encounter (Signed)
Order placed at [facility] imaging can walk in between 8:30am-4:30pm no appointment needed. Patient aware.

## 2021-11-03 NOTE — Telephone Encounter (Signed)
-----   Message from Salvadore Dom, MD sent at 11/03/2021  2:42 PM EST ----- Please get the patient set up for a KUB for a missing IUD, not seen on ultrasound.  Thanks, Sharee Pimple

## 2021-11-03 NOTE — Progress Notes (Signed)
GYNECOLOGY  VISIT   HPI: 50 y.o.   Married Black or [place] American Not Hispanic or Latino  female   [phone] with No LMP recorded. (Menstrual status: IUD).   here for evaluation of AUB with a mirena IUD. She had a mirena IUD placed in 8/20, no bleeding since IUD insertion until last month at which point she bleed heavily x 3 days. IUD strings not seen on exam last week.    Tried to draw [place] and TSH last week, but the phlebotomist was unable to obtain a sample (she is a hard stick, on dialysis).   GYNECOLOGIC HISTORY: No LMP recorded. (Menstrual status: IUD). Contraception: IUD Menopausal hormone therapy: none        OB History     Gravida  2   Para  1   Term      Preterm      AB  1   Living  1      SAB  1   IAB      Ectopic      Multiple      Live Births                 Patient Active Problem List   Diagnosis Date Noted   S/P BKA (below knee amputation), right ([place]) 03/31/2021   Impaired mobility and ADLs 03/25/2021   CVA (cerebral vascular accident) ([place]) 03/04/2021   Pulmonary embolus ([place]) 03/04/2021   Charcot ankle, right 02/23/2021   Hx of cardiac arrest 11/12/2020   Abnormal CT of the chest 09/26/2020   Allergy, unspecified, initial encounter 09/22/2020   Anaphylactic shock, unspecified, initial encounter 09/22/2020   Fluid overload, unspecified 06/10/2020   Other mechanical complication of other cardiac and vascular devices and implants, initial encounter ([place]) 05/10/2020   Positive ANA (antinuclear antibody) 04/16/2020   ESRD on dialysis [facility])    Hemiplegia and hemiparesis following cerebral infarction affecting left non-dominant side ([place]) 12/29/2018   IBS (irritable bowel syndrome) 08/06/2017   Dyslipidemia associated with type 2 diabetes mellitus ([place]) 06/25/2016   Proliferative diabetic retinopathy ([place]) 09/29/2015   Chronic cough 10/04/2014   B12 deficiency 05/10/2014   Anemia in chronic kidney disease 04/26/2014   CKD (chronic kidney  disease) stage 5, GFR less than 15 ml/min (HCC) 04/26/2014   Secondary hyperparathyroidism of renal origin ([place]) 11/27/2013   Posterior subcapsular cataract, bilateral 10/18/2013   Diabetic neuropathy, painful ([place]) 07/13/2013   Diabetes mellitus, type II, insulin dependent ([place]), on CGM, with end organ damage: renal, neuropathy, gastroparesis, retinopathy 02/19/2011   Hypertension associated with diabetes ([place]) 02/19/2011   Gastroparesis due to DM ([place]) 02/19/2011    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[place]), nonhemorrhagic, inferior right cerebellum 12/03/2017   left side weakness in leg   Cyst of left ovary 01/12/2018   Diabetic neuropathy, painful ([place]), on low dose Gabapentin 07/13/2013   DM (diabetes mellitus), type 2, uncontrolled, with renal complications 58/85/0277   DM2 (diabetes mellitus, type 2) ([place])    Dysfunction of left eustachian tube, with pusatile tinnitus 11/24/2017   Dyslipidemia associated with type 2 diabetes mellitus ([place]) 06/25/2016   ESRD (end stage renal disease) on dialysis [facility])    On hemodialysis in July 2019 via [facility] then switched to [facility] in Nov 2019.    ESRD with  anemia (HCC)    Fibromyalgia    Gastroparesis due to DM    GERD (gastroesophageal reflux disease)    Headache    Hypertension associated with diabetes ([place]) 02/19/2011   IBS (irritable bowel syndrome)    Left-sided weakness 12/10/2017   .   Leiomyoma of uterus    Pancreatitis    PE (pulmonary thromboembolism) ([place]) 11/04/2019   Pneumonia    Proliferative diabetic retinopathy ([place]) 09/29/2015   Pronation deformity of both feet 06/14/2014   Reactive depression 12/10/2017   .   Right-sided low back pain without sciatica 12/08/2015   Secondary hyperthyroidism 11/27/2013   Steal syndrome dialysis  vascular access ([place]) 02/17/2018   Thromboembolism ([place]) 04/05/2018   Upper airway cough syndrome, with recs to stay off ACE and take Pepcid q hs 11/15/2016    Past Surgical History:  Procedure Laterality Date   A/V FISTULAGRAM Right 03/03/2020   Procedure: Venogram;  Surgeon: Waynetta Sandy, MD;  Location: [place] [facility];  Service: Cardiovascular;  Laterality: Right;   ARTERY REPAIR Right 02/17/2018   Procedure: EXPLORATION OF RIGHT BRACHIAL ARTERY;  Surgeon: Conrad [place], MD;  Location: [place];  Service: Vascular;  Laterality: Right;   ARTERY REPAIR Right 02/17/2018   Procedure: BRACHIAL ARTERY EXPLORATION AND TRHOMBECTOMY;  Surgeon: Conrad [place], MD;  Location: [place];  Service: Vascular;  Laterality: Right;   AV FISTULA PLACEMENT Left 05/09/2017   Procedure: INSERTION OF ARTERIOVENOUS (AV) GRAFT ARM (ARTEGRAFT);  Surgeon: Conrad [place], MD;  Location: [place];  Service: Vascular;  Laterality: Left;   AV FISTULA PLACEMENT Right 02/17/2018   Procedure: INSERTION OF ARTERIOVENOUS (AV) GORE-TEX GRAFT ARM RIGHT UPPER ARM;  Surgeon: Conrad [place], MD;  Location: [place];  Service: Vascular;  Laterality: Right;   AV FISTULA PLACEMENT Left 10/10/2019   Procedure: INSERTION OF LEFT ARTERIOVENOUS (AV) ARTEGRAFT GRAFT ARM;  Surgeon: Waynetta Sandy, MD;  Location: [place];  Service: Vascular;  Laterality: Left;   AV FISTULA PLACEMENT Right 03/19/2020   Procedure: INSERTION OF ARTERIOVENOUS (AV) GORE-TEX GRAFT IN RIGHT ARM AND VIABAHN STENT IN RIGHT AXILLARY ARTERY;  Surgeon: Waynetta Sandy, MD;  Location: [place];  Service: Vascular;  Laterality: Right;   [place] Left 08/31/2016   Procedure: BASILIC VEIN TRANSPOSITION FIRST STAGE;  Surgeon: Conrad [place], MD;  Location: [place];  Service: Vascular;  Laterality: Left;   BRONCHIAL WASHINGS  09/17/2021   Procedure: BRONCHIAL WASHINGS;  Surgeon: Collene Gobble, MD;  Location: [facility];  Service: Cardiopulmonary;;    CENTRAL VENOUS CATHETER INSERTION Left 07/24/2018   Procedure: INSERTION CENTRAL LINE ADULT;  Surgeon: Conrad [place], MD;  Location: [place];  Service: Vascular;  Laterality: Left;   EYE SURGERY     secondary to diabetic retinopathy    FOOT SURGERY Right    t"ook bone out- maybe hammer toe"   INSERTION OF DIALYSIS CATHETER Left 07/24/2018   Procedure: INSERTION OF TUNNELED DIALYSIS CATHETER;  Surgeon: Conrad [place], MD;  Location: [place];  Service: Vascular;  Laterality: Left;   IR FLUORO GUIDE CV LINE RIGHT  07/21/2018   IR FLUORO GUIDE CV LINE RIGHT  06/01/2019   IR FLUORO GUIDE CV LINE RIGHT  05/05/2020   IR REMOVAL TUN CV CATH W/O FL  01/23/2021   IR US GUIDE VASC ACCESS RIGHT  07/21/2018   IR US GUIDE VASC ACCESS RIGHT  06/01/2019   LIGATION ARTERIOVENOUS GORTEX GRAFT Right 03/20/2020   Procedure:  LIGATION ARM ARTERIOVENOUS GORTEX GRAFT With Patch Angioplasty, Thrombectomy;  Surgeon: Waynetta Sandy, MD;  Location: [place];  Service: Vascular;  Laterality: Right;   LIGATION OF ARTERIOVENOUS  FISTULA Left 05/09/2017   Procedure: LIGATION OF ARTERIOVENOUS  FISTULA;  Surgeon: Conrad [place], MD;  Location: [place];  Service: Vascular;  Laterality: Left;   LOOP RECORDER INSERTION N/A 12/05/2017   Procedure: LOOP RECORDER INSERTION;  Surgeon: Evans Lance, MD;  Location: [place] [facility];  Service: Cardiovascular;  Laterality: N/A;   MYOMECTOMY     REMOVAL OF GRAFT Right 02/17/2018   Procedure: REMOVAL OF RIGHT UPPER ARM ARTERIOVENOUS GRAFT;  Surgeon: Conrad [place], MD;  Location: [place];  Service: Vascular;  Laterality: Right;   REVISION OF ARTERIOVENOUS GORETEX GRAFT Left 07/24/2018   Procedure: REDO ARTERIOVENOUS GORETEX GRAFT;  Surgeon: Conrad [place], MD;  Location: [place];  Service: Vascular;  Laterality: Left;   TEE WITHOUT CARDIOVERSION N/A 12/05/2017   Procedure: TRANSESOPHAGEAL ECHOCARDIOGRAM (TEE);  Surgeon: Sanda Klein, MD;  Location: [place];  Service: Cardiovascular;   Laterality: N/A;   UPPER EXTREMITY VENOGRAPHY Left 07/13/2018   Procedure: UPPER EXTREMITY VENOGRAPHY - Central & Left Arm;  Surgeon: Conrad [place], MD;  Location: [place] [facility];  Service: Cardiovascular;  Laterality: Left;   UPPER EXTREMITY VENOGRAPHY Bilateral 09/10/2019   Procedure: UPPER EXTREMITY VENOGRAPHY;  Surgeon: Waynetta Sandy, MD;  Location: [place] [facility];  Service: Cardiovascular;  Laterality: Bilateral;   UTERINE FIBROID SURGERY     VIDEO BRONCHOSCOPY N/A 09/17/2021   Procedure: VIDEO BRONCHOSCOPY WITHOUT FLUORO;  Surgeon: Collene Gobble, MD;  [facility];  Service: Cardiopulmonary;  Laterality: N/A;   VITRECTOMY Bilateral     Current Outpatient Medications  Medication Sig Dispense Refill   calcium acetate (PHOSLO) 667 MG capsule Take 667 mg by mouth 3 (three) times daily with meals.     Continuous Blood Gluc Sensor (FREESTYLE LIBRE 14 DAY SENSOR) MISC 1 patch by Does not apply route every 14 (fourteen) days. 6 each 5   doxycycline (VIBRA-TABS) 100 MG tablet Take 1 tablet (100 mg total) by mouth 2 (two) times daily. 20 tablet 0   gabapentin (NEURONTIN) 300 MG capsule Take 1 capsule (300 mg total) by mouth 3 (three) times daily. Take after dialysis. Do not take more than three times weekly.     insulin aspart (NOVOLOG FLEXPEN) 100 UNIT/ML FlexPen Inject 6 Units into the skin 3 (three) times daily with meals. Sliding scale     insulin degludec (TRESIBA FLEXTOUCH) 100 UNIT/ML SOPN FlexTouch Pen Inject 0.26 mLs (26 Units total) into the skin at bedtime. 24 mL 3   Insulin Pen Needle 29G X 5MM MISC 1 Device by Does not apply route 4 (four) times daily. 400 each 3   metroNIDAZOLE (METROGEL) 0.75 % vaginal gel Insert applicatorful hs x 5 nights. 70 g 0   midodrine (PROAMATINE) 5 MG tablet Take 1 tablet (5 mg total) by mouth See admin instructions. Given at Dialysis if needed     No current facility-administered medications for this visit.     ALLERGIES:  Ibuprofen, Dilaudid [hydromorphone hcl], Iodinated diagnostic agents, and Tramadol  Family History  Problem Relation Age of Onset   Colon polyps Mother    Diabetes Mother    Heart murmur Father        history essentially unknown   Hypertension Father    Diabetes Sister    Kidney failure Sister  kidney transplant   Diabetes Brother    Retinal degeneration Brother    Heart murmur Son    Stroke Paternal Grandmother    Diabetes Sister     Social History   Socioeconomic History   Marital status: Married    Spouse name: Not on file   Number of children: 1   Years of education: college   Highest education level: Not on file  Occupational History   Occupation: customer service rep    Employer: [employer]  Tobacco Use   Smoking status: Never   Smokeless tobacco: Never  Vaping Use   Vaping Use: Never used  Substance and Sexual Activity   Alcohol use: No    Alcohol/week: 0.0 standard drinks   Drug use: No   Sexual activity: Not Currently    Partners: Male    Birth control/protection: I.U.D.  Other Topics Concern   Not on file  Social History Narrative   Patient lives with fiance. She has 1 grown son. Works full-time, she Paramedic for attorney.   Caffeine Use: 2 cups daily; sodas occasionally   She has 7 grandchildren   Social Determinants of Radio broadcast assistant Strain: Not on file  Food Insecurity: Not on file  Transportation Needs: Not on file  Physical Activity: Not on file  Stress: Not on file  Social Connections: Not on file  Intimate Partner Violence: Not on file    ROS  Pelvic ultrasound  Indications:   Findings:  Anteverted Uterus 10.68 x 6.92 x 6.12 cm   Endometrium 4.34 mm  Left ovary 2.34 x 1.86 x 1.94 cm  Right ovary 1.9 x 1.39 x 0.98 cm  No free fluid  Impression:  Slightly enlarged, anteverted uterus No clear myometrial masses Thin, symmetrical endometrium IUD not seen  Bilateral atrophic appearing  ovaries   PHYSICAL EXAMINATION:    There were no vitals taken for this visit.    General appearance: alert, cooperative and appears stated age  77. Intrauterine contraceptive device threads lost, subsequent encounter IUD not seen on ultrasound. I suspect she expulsed the IUD with her heavy bleeding last month Will arrange a KUB

## 2021-11-04 LAB — ACID FAST CULTURE WITH REFLEXED SENSITIVITIES (MYCOBACTERIA): Acid Fast Culture: NEGATIVE

## 2021-11-05 ENCOUNTER — Other Ambulatory Visit: Payer: Self-pay

## 2021-11-05 ENCOUNTER — Emergency Department (HOSPITAL_BASED_OUTPATIENT_CLINIC_OR_DEPARTMENT_OTHER)
Admission: EM | Admit: 2021-11-05 | Discharge: 2021-11-05 | Disposition: A | Discharge status: Home / Self Care | Payer: Medicare Other | Attending: Emergency Medicine | Admitting: Emergency Medicine

## 2021-11-05 ENCOUNTER — Encounter (HOSPITAL_BASED_OUTPATIENT_CLINIC_OR_DEPARTMENT_OTHER): Payer: Self-pay

## 2021-11-05 ENCOUNTER — Other Ambulatory Visit: Payer: Self-pay | Admitting: Emergency Medicine

## 2021-11-05 DIAGNOSIS — Z992 Dependence on renal dialysis: Secondary | ICD-10-CM | POA: Diagnosis not present

## 2021-11-05 DIAGNOSIS — Z794 Long term (current) use of insulin: Secondary | ICD-10-CM | POA: Diagnosis not present

## 2021-11-05 DIAGNOSIS — R1012 Left upper quadrant pain: Secondary | ICD-10-CM | POA: Diagnosis not present

## 2021-11-05 DIAGNOSIS — D631 Anemia in chronic kidney disease: Secondary | ICD-10-CM | POA: Insufficient documentation

## 2021-11-05 DIAGNOSIS — E113599 Type 2 diabetes mellitus with proliferative diabetic retinopathy without macular edema, unspecified eye: Secondary | ICD-10-CM | POA: Diagnosis not present

## 2021-11-05 DIAGNOSIS — E1122 Type 2 diabetes mellitus with diabetic chronic kidney disease: Secondary | ICD-10-CM | POA: Insufficient documentation

## 2021-11-05 DIAGNOSIS — E1140 Type 2 diabetes mellitus with diabetic neuropathy, unspecified: Secondary | ICD-10-CM | POA: Diagnosis not present

## 2021-11-05 DIAGNOSIS — I120 Hypertensive chronic kidney disease with stage 5 chronic kidney disease or end stage renal disease: Secondary | ICD-10-CM | POA: Diagnosis not present

## 2021-11-05 DIAGNOSIS — E1143 Type 2 diabetes mellitus with diabetic autonomic (poly)neuropathy: Secondary | ICD-10-CM | POA: Insufficient documentation

## 2021-11-05 DIAGNOSIS — N186 End stage renal disease: Secondary | ICD-10-CM | POA: Diagnosis not present

## 2021-11-05 LAB — COMPREHENSIVE METABOLIC PANEL
ALT: 46 U/L — ABNORMAL HIGH (ref 0–44)
AST: 30 U/L (ref 15–41)
Albumin: 4 g/dL (ref 3.5–5.0)
Alkaline Phosphatase: 98 U/L (ref 38–126)
Anion gap: 11 (ref 5–15)
BUN: 40 mg/dL — ABNORMAL HIGH (ref 6–20)
CO2: 31 mmol/L (ref 22–32)
Calcium: 9.6 mg/dL (ref 8.9–10.3)
Chloride: 96 mmol/L — ABNORMAL LOW (ref 98–111)
Creatinine, Ser: 7.94 mg/dL (ref 0.44–1.00)
GFR, Estimated: 6 mL/min — ABNORMAL LOW (ref >60)
Glucose, Bld: 121 mg/dL — ABNORMAL HIGH (ref 70–99)
Potassium: 4 mmol/L (ref 3.5–5.1)
Sodium: 138 mmol/L (ref 135–145)
Total Bilirubin: 0.6 mg/dL (ref 0.3–1.2)
Total Protein: 7.9 g/dL (ref 6.5–8.1)

## 2021-11-05 LAB — CBC
HCT: 29.6 % — ABNORMAL LOW (ref 36.0–46.0)
Hemoglobin: 9.1 g/dL — ABNORMAL LOW (ref 12.0–15.0)
MCH: 31.4 pg (ref 26.0–34.0)
MCHC: 30.7 g/dL (ref 30.0–36.0)
MCV: 102.1 fL — ABNORMAL HIGH (ref 80.0–100.0)
Platelets: 190 10*3/uL (ref 150–400)
RBC: 2.9 MIL/uL — ABNORMAL LOW (ref 3.87–5.11)
RDW: 17.2 % — ABNORMAL HIGH (ref 11.5–15.5)
WBC: 6.7 10*3/uL (ref 4.0–10.5)
nRBC: 0 % (ref 0.0–0.2)

## 2021-11-05 LAB — LIPASE, BLOOD: Lipase: 36 U/L (ref 11–51)

## 2021-11-05 MED ORDER — PANTOPRAZOLE SODIUM 40 MG PO TBEC
40.0000 mg | DELAYED_RELEASE_TABLET | Freq: Every day | ORAL | 0 refills | Status: DC
Start: 2021-11-05 — End: 2021-12-19

## 2021-11-05 MED ORDER — FENTANYL CITRATE PF 50 MCG/ML IJ SOSY
100.0000 ug | PREFILLED_SYRINGE | Freq: Once | INTRAMUSCULAR | Status: AC
  Administered 2021-11-05: 100 ug via INTRAVENOUS
  Filled 2021-11-05: qty 2

## 2021-11-05 MED ORDER — DIPHENHYDRAMINE HCL 50 MG/ML IJ SOLN
25.0000 mg | Freq: Once | INTRAMUSCULAR | Status: AC
  Administered 2021-11-05: 25 mg via INTRAVENOUS
  Filled 2021-11-05: qty 1

## 2021-11-05 NOTE — ED Triage Notes (Signed)
Pt. arrives with c/o LLQ starting today while at dialysis, states she was unable to receive full treatment. Also c/o nausea last night.

## 2021-11-05 NOTE — ED Notes (Signed)
Pt unable to urinate at present time

## 2021-11-05 NOTE — ED Provider Notes (Signed)
[facility] Provider Note   CSN: [id number] Arrival date & time: 11/05/21  8416     History Chief Complaint  Patient presents with   Abdominal Pain    Heather [name] is a 50 y.o. female with past medical history of CVA, diabetes, ESRD on dialysis, HTN, pancreatitis, PE, right BKA presenting for 1 day of left upper abdominal pain.  Seen in ED 10/21 for similar abdominal pain thought to have pyelonephritis.  Patient states this abdominal pain is similar as before.  Yesterday abdominal pain was 10 out of 10 today slightly improved 8 out of 10 pain.  Hard for her to describe but it is a constant pain she has not taking anything for it at home.  Was given fentanyl in ED which she states helped improve abdominal pain at that time.  She denies fever back pain nausea vomiting constipation diarrhea.  Of note, had dialysis today.   The history is provided by the patient.  Abdominal Pain Pain location:  LUQ Pain radiates to:  Does not radiate Pain severity:  Moderate Onset quality:  Sudden Duration:  1 day Timing:  Constant Progression:  Improving Chronicity:  Recurrent Context: not diet changes   Relieved by:  Nothing Worsened by:  Movement and palpation Associated symptoms: no chest pain, no chills, no cough, no fever, no shortness of breath and no vomiting       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[place]), nonhemorrhagic, inferior right cerebellum 12/03/2017   left side weakness in leg   Cyst of left ovary 01/12/2018   Diabetic neuropathy, painful ([place]), on low dose Gabapentin 07/13/2013   DM (diabetes mellitus), type 2, uncontrolled, with renal complications 60/63/0160   DM2 (diabetes mellitus, type 2) ([place])    Dysfunction of left eustachian tube,  with pusatile tinnitus 11/24/2017   Dyslipidemia associated with type 2 diabetes mellitus ([place]) 06/25/2016   ESRD (end stage renal disease) on dialysis [facility])    On hemodialysis in July 2019 via [facility] then switched to [facility] in Nov 2019.    ESRD with anemia (HCC)    Fibromyalgia    Gastroparesis due to DM    GERD (gastroesophageal reflux disease)    Headache    Hypertension associated with diabetes ([place]) 02/19/2011   IBS (irritable bowel syndrome)    Left-sided weakness 12/10/2017   .   Leiomyoma of uterus    Pancreatitis    PE (pulmonary thromboembolism) ([place]) 11/04/2019   Pneumonia    Proliferative diabetic retinopathy ([place]) 09/29/2015   Pronation deformity of both feet 06/14/2014   Reactive depression 12/10/2017   .   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[place]) 02/17/2018   Thromboembolism ([place]) 04/05/2018   Upper airway cough syndrome, with recs to stay off ACE and take Pepcid q hs 11/15/2016    Patient Active Problem List   Diagnosis Date Noted   S/P BKA (below knee amputation), right ([place]) 03/31/2021   Impaired mobility and ADLs 03/25/2021   CVA (cerebral vascular accident) ([employer]) 03/04/2021   Pulmonary embolus ([place]) 03/04/2021   Charcot ankle, right 02/23/2021   Hx of cardiac arrest 11/12/2020   Abnormal CT of the chest 09/26/2020   Allergy, unspecified, initial encounter 09/22/2020  Anaphylactic shock, unspecified, initial encounter 09/22/2020   Fluid overload, unspecified 06/10/2020   Other mechanical complication of other cardiac and vascular devices and implants, initial encounter ([place]) 05/10/2020   Positive ANA (antinuclear antibody) 04/16/2020   ESRD on dialysis [facility])    Hemiplegia and hemiparesis following cerebral infarction affecting left non-dominant side ([place]) 12/29/2018   IBS (irritable bowel syndrome) 08/06/2017   Dyslipidemia associated with type 2 diabetes mellitus  ([place]) 06/25/2016   Proliferative diabetic retinopathy ([place]) 09/29/2015   Chronic cough 10/04/2014   B12 deficiency 05/10/2014   Anemia in chronic kidney disease 04/26/2014   CKD (chronic kidney disease) stage 5, GFR less than 15 ml/min (HCC) 04/26/2014   Secondary hyperparathyroidism of renal origin ([place]) 11/27/2013   Posterior subcapsular cataract, bilateral 10/18/2013   Diabetic neuropathy, painful ([place]) 07/13/2013   Diabetes mellitus, type II, insulin dependent ([place]), on CGM, with end organ damage: renal, neuropathy, gastroparesis, retinopathy 02/19/2011   Hypertension associated with diabetes ([place]) 02/19/2011   Gastroparesis due to DM ([place]) 02/19/2011    Past Surgical History:  Procedure Laterality Date   A/V FISTULAGRAM Right 03/03/2020   Procedure: Venogram;  Surgeon: Waynetta Sandy, MD;  Location: [place] [facility];  Service: Cardiovascular;  Laterality: Right;   ARTERY REPAIR Right 02/17/2018   Procedure: EXPLORATION OF RIGHT BRACHIAL ARTERY;  Surgeon: Conrad [place], MD;  Location: [place];  Service: Vascular;  Laterality: Right;   ARTERY REPAIR Right 02/17/2018   Procedure: BRACHIAL ARTERY EXPLORATION AND TRHOMBECTOMY;  Surgeon: Conrad [place], MD;  Location: [place];  Service: Vascular;  Laterality: Right;   AV FISTULA PLACEMENT Left 05/09/2017   Procedure: INSERTION OF ARTERIOVENOUS (AV) GRAFT ARM (ARTEGRAFT);  Surgeon: Conrad [place], MD;  Location: [facility] OR;  Service: Vascular;  Laterality: Left;   AV FISTULA PLACEMENT Right 02/17/2018   Procedure: INSERTION OF ARTERIOVENOUS (AV) GORE-TEX GRAFT ARM RIGHT UPPER ARM;  Surgeon: Conrad [place], MD;  Location: [place];  Service: Vascular;  Laterality: Right;   AV FISTULA PLACEMENT Left 10/10/2019   Procedure: INSERTION OF LEFT ARTERIOVENOUS (AV) ARTEGRAFT GRAFT ARM;  Surgeon: Waynetta Sandy, MD;  Location: [place];  Service: Vascular;  Laterality: Left;   AV FISTULA PLACEMENT Right 03/19/2020   Procedure: INSERTION OF  ARTERIOVENOUS (AV) GORE-TEX GRAFT IN RIGHT ARM AND VIABAHN STENT IN RIGHT AXILLARY ARTERY;  Surgeon: Waynetta Sandy, MD;  Location: [place];  Service: Vascular;  Laterality: Right;   [place] Left 08/31/2016   Procedure: BASILIC VEIN TRANSPOSITION FIRST STAGE;  Surgeon: Conrad [place], MD;  Location: [place];  Service: Vascular;  Laterality: Left;   BRONCHIAL WASHINGS  09/17/2021   Procedure: BRONCHIAL WASHINGS;  Surgeon: Collene Gobble, MD;  Location: [facility];  Service: Cardiopulmonary;;   CENTRAL VENOUS CATHETER INSERTION Left 07/24/2018   Procedure: INSERTION CENTRAL LINE ADULT;  Surgeon: Conrad Shaft, MD;  Location: [place];  Service: Vascular;  Laterality: Left;   EYE SURGERY     secondary to diabetic retinopathy    FOOT SURGERY Right    t"ook bone out- maybe hammer toe"   INSERTION OF DIALYSIS CATHETER Left 07/24/2018   Procedure: INSERTION OF TUNNELED DIALYSIS CATHETER;  Surgeon: Conrad [place], MD;  Location: [facility];  Service: Vascular;  Laterality: Left;   IR FLUORO GUIDE CV LINE RIGHT  07/21/2018   IR FLUORO GUIDE CV LINE RIGHT  06/01/2019   IR FLUORO GUIDE CV LINE RIGHT  05/05/2020   IR REMOVAL TUN CV  CATH W/O FL  01/23/2021   IR US GUIDE VASC ACCESS RIGHT  07/21/2018   IR US GUIDE VASC ACCESS RIGHT  06/01/2019   LIGATION ARTERIOVENOUS GORTEX GRAFT Right 03/20/2020   Procedure: LIGATION ARM ARTERIOVENOUS GORTEX GRAFT With Patch Angioplasty, Thrombectomy;  Surgeon: Waynetta Sandy, MD;  Location: [facility];  Service: Vascular;  Laterality: Right;   LIGATION OF ARTERIOVENOUS  FISTULA Left 05/09/2017   Procedure: LIGATION OF ARTERIOVENOUS  FISTULA;  Surgeon: Conrad [place], MD;  Location: [place];  Service: Vascular;  Laterality: Left;   LOOP RECORDER INSERTION N/A 12/05/2017   Procedure: LOOP RECORDER INSERTION;  Surgeon: Evans Lance, MD;  Location: [place] CV [facility];  Service: Cardiovascular;  Laterality: N/A;   MYOMECTOMY     REMOVAL OF GRAFT Right 02/17/2018    Procedure: REMOVAL OF RIGHT UPPER ARM ARTERIOVENOUS GRAFT;  Surgeon: Conrad [place], MD;  Location: [place];  Service: Vascular;  Laterality: Right;   REVISION OF ARTERIOVENOUS GORETEX GRAFT Left 07/24/2018   Procedure: REDO ARTERIOVENOUS GORETEX GRAFT;  Surgeon: Conrad Theodore, MD;  Location: [place];  Service: Vascular;  Laterality: Left;   TEE WITHOUT CARDIOVERSION N/A 12/05/2017   Procedure: TRANSESOPHAGEAL ECHOCARDIOGRAM (TEE);  Surgeon: Sanda Klein, MD;  Location: [place];  Service: Cardiovascular;  Laterality: N/A;   UPPER EXTREMITY VENOGRAPHY Left 07/13/2018   Procedure: UPPER EXTREMITY VENOGRAPHY - Central & Left Arm;  Surgeon: Conrad [place], MD;  Location: [place] CV LAB;  Service: Cardiovascular;  Laterality: Left;   UPPER EXTREMITY VENOGRAPHY Bilateral 09/10/2019   Procedure: UPPER EXTREMITY VENOGRAPHY;  Surgeon: Waynetta Sandy, MD;  Location: [place] [facility];  Service: Cardiovascular;  Laterality: Bilateral;   UTERINE FIBROID SURGERY     VIDEO BRONCHOSCOPY N/A 09/17/2021   Procedure: VIDEO BRONCHOSCOPY WITHOUT FLUORO;  Surgeon: Collene Gobble, MD;  [facility];  Service: Cardiopulmonary;  Laterality: N/A;   VITRECTOMY Bilateral      OB History     Gravida  2   Para  1   Term      Preterm      AB  1   Living  1      SAB  1   IAB      Ectopic      Multiple      Live Births              Family History  Problem Relation Age of Onset   Colon polyps Mother    Diabetes Mother    Heart murmur Father        history essentially unknown   Hypertension Father    Diabetes Sister    Kidney failure Sister        kidney transplant   Diabetes Brother    Retinal degeneration Brother    Heart murmur Son    Stroke Paternal Grandmother    Diabetes Sister     Social History   Tobacco Use   Smoking status: Never   Smokeless tobacco: Never  Vaping Use   Vaping Use: Never used  Substance Use Topics   Alcohol use: No     Alcohol/week: 0.0 standard drinks   Drug use: No    Home Medications Prior to Admission medications   Medication Sig Start Date End Date Taking? Authorizing Provider  calcium acetate (PHOSLO) 667 MG capsule Take 667 mg by mouth 3 (three) times daily with meals. 08/25/21   [provider]  Continuous Blood Gluc Sensor (  FREESTYLE LIBRE 14 DAY SENSOR) MISC 1 patch by Does not apply route every 14 (fourteen) days. 07/10/21   [name], MD  doxycycline (VIBRA-TABS) 100 MG tablet Take 1 tablet (100 mg total) by mouth 2 (two) times daily. 10/28/21   Inda Coke, PA  gabapentin (NEURONTIN) 300 MG capsule Take 1 capsule (300 mg total) by mouth 3 (three) times daily. Take after dialysis. Do not take more than three times weekly. 09/17/21   Collene Gobble, MD  insulin aspart (NOVOLOG FLEXPEN) 100 UNIT/ML FlexPen Inject 6 Units into the skin 3 (three) times daily with meals. Sliding scale 07/16/20   [name], MD  insulin degludec (TRESIBA FLEXTOUCH) 100 UNIT/ML SOPN FlexTouch Pen Inject 0.26 mLs (26 Units total) into the skin at bedtime. 12/26/19   Shamleffer, Melanie Crazier, MD  Insulin Pen Needle 29G X 5MM MISC 1 Device by Does not apply route 4 (four) times daily. 12/26/19   Shamleffer, Melanie Crazier, MD  metroNIDAZOLE (METROGEL) 0.75 % vaginal gel Insert applicatorful hs x 5 nights. 10/28/21   Salvadore Dom, MD  midodrine (PROAMATINE) 5 MG tablet Take 1 tablet (5 mg total) by mouth See admin instructions. Given at Dialysis if needed 09/17/21   Collene Gobble, MD    Allergies    Ibuprofen, Dilaudid [hydromorphone hcl], Iodinated diagnostic agents, and Tramadol  Review of Systems   Review of Systems  Constitutional:  Negative for chills and fever.  HENT:  Negative for congestion.   Eyes:  Negative for visual disturbance.  Respiratory:  Negative for cough and shortness of breath.   Cardiovascular:  Negative for chest pain.  Gastrointestinal:  Positive for abdominal  pain. Negative for vomiting.  Genitourinary:  Positive for decreased urine volume.  Musculoskeletal:  Negative for arthralgias.  Neurological:  Negative for dizziness, weakness and headaches.   Physical Exam Updated Vital Signs BP (!) 162/91 (BP Location: Right Arm)   Pulse 88   Temp 98.4 F (36.9 C) (Oral)   Resp 16   Ht 5\' 8"  (1.727 m)   Wt 98 kg   LMP  (LMP Unknown)   SpO2 100%   BMI 32.84 kg/m   Physical Exam Vitals and nursing note reviewed.  Constitutional:      General: [name] not in acute distress.    Appearance: [name] not ill-appearing, toxic-appearing or diaphoretic.  Eyes:     Extraocular Movements: Extraocular movements intact.     Pupils: Pupils are equal, round, and reactive to light.  Cardiovascular:     Rate and Rhythm: Normal rate and regular rhythm.     Heart sounds: Normal heart sounds.  Pulmonary:     Effort: Pulmonary effort is normal.     Breath sounds: Normal breath sounds.  Abdominal:     General: Abdomen is protuberant. Bowel sounds are normal.     Palpations: Abdomen is soft. There is no fluid wave.     Tenderness: There is abdominal tenderness in the left upper quadrant. There is no guarding or rebound.  Neurological:     Mental Status: [name] alert and oriented to person, place, and time.  Psychiatric:        Mood and Affect: Mood normal.        Behavior: Behavior normal.    ED Results / Procedures / Treatments   Labs (all labs ordered are listed, but only abnormal results are displayed) Labs Reviewed  CBC - Abnormal; Notable for the following components:  Result Value   RBC 2.90 (*)    Hemoglobin 9.1 (*)    HCT 29.6 (*)    MCV 102.1 (*)    RDW 17.2 (*)    All other components within normal limits  COMPREHENSIVE METABOLIC PANEL - Abnormal; Notable for the following components:   Chloride 96 (*)    Glucose, Bld 121 (*)    BUN 40 (*)    Creatinine, Ser 7.94 (*)    ALT 46 (*)    GFR, Estimated 6 (*)    All other components  within normal limits  URINALYSIS, ROUTINE W REFLEX MICROSCOPIC  LIPASE, BLOOD    EKG None  Radiology No results found.  Procedures Procedures   Medications Ordered in ED Medications  diphenhydrAMINE (BENADRYL) injection 25 mg (has no administration in time range)  fentaNYL (SUBLIMAZE) injection 100 mcg (100 mcg Intravenous Given 11/05/21 1133)    ED Course  I have reviewed the triage vital signs and the nursing notes.  Pertinent labs & imaging results that were available during my care of the patient were reviewed by me and considered in my medical decision making (see chart for details).  Initially endorsing abdominal pain 8 out of 10. Reassessed s/p fentanyl abdominal paint improved 6 out of 10. Having pruritis, benadryl given.     MDM Rules/Calculators/A&P                          50 yo F w/ PMHx of CVA, diabetes, ESRD on dialysis, HTN, pancreatitis, PE, right BKA presenting for 1 day of left upper abdominal pain.   Endorsing abdominal pain similar to her last ED visit 10/21 when she had suspected pyelonephritis. Spouse at bedside said she did get antibiotics for this and completed the course. She makes very little urine, urinated 1x daily and has already urinated today. She denies dysuria and suprapubic discomfort at this time. UA ordered but not likely to be obtained. On exam she is tender in the LUQ but is not guarding. S/p fentanyl x1 abdominal pain improved. Consider constipation but patient with is having normal soft bowel movement. Considered acute on chronic pancreatitis but lower suspicion given lack of more systemic symptoms and normal lipase. Will not repeat abdominal imaging at this time. Will unrevealing work up today considered gastritis. Will discharge with protonix and GI follow up. Discussed plan with patient and husband and they are agreeable with this plan. Encouraged PCP follow up or returning to care if symptoms worsen.   Final Clinical Impression(s) / ED  Diagnoses Final diagnoses:  Left upper quadrant abdominal pain    Rx / DC Orders ED Discharge Orders          Ordered    pantoprazole (PROTONIX) 40 MG tablet  Daily        11/05/21 1322             Autry-Lott, [place], DO 11/05/21 1333    Isla Pence, MD 11/05/21 1429

## 2021-11-10 NOTE — Telephone Encounter (Signed)
I called patient and spoke with her regarding X-ray as a reminder. Patient said she did forget about X-rays,reports she will have her son take her Loss adjuster, chartered.

## 2021-11-12 ENCOUNTER — Ambulatory Visit
Admission: RE | Admit: 2021-11-12 | Discharge: 2021-11-12 | Disposition: A | Discharge status: Home / Self Care | Payer: Managed Care, Other (non HMO) | Source: Ambulatory Visit | Attending: Obstetrics and Gynecology | Admitting: Obstetrics and Gynecology

## 2021-11-12 DIAGNOSIS — T8332XD Displacement of intrauterine contraceptive device, subsequent encounter: Secondary | ICD-10-CM

## 2021-11-12 NOTE — Telephone Encounter (Signed)
Patient had X-ray done today.

## 2021-11-25 ENCOUNTER — Other Ambulatory Visit (HOSPITAL_COMMUNITY): Payer: Self-pay | Admitting: Nephrology

## 2021-11-25 ENCOUNTER — Telehealth: Payer: Self-pay | Admitting: Student

## 2021-11-25 DIAGNOSIS — N186 End stage renal disease: Secondary | ICD-10-CM

## 2021-11-25 MED ORDER — PREDNISONE 50 MG PO TABS: ORAL_TABLET | ORAL | 0 refills | Status: DC

## 2021-11-25 NOTE — Telephone Encounter (Signed)
Patient scheduled for IR procedure 11/27/21 that may include the use of contrast (eval of malfunctioning HD catheter). Patient has a contrast allergy - prescription for prednisone [facility]. Three tablets of 50 mg prednisone PO to be taken at 13 hours, 7 hours and 1 hour prior to procedure.   Patient will also need to take 50 mg PO benadryl one hour prior to procedure. I called the patient and left her a VM to call us back so that we can discuss this medication regimen.  Soyla Dryer, [place] [phone] 11/25/2021, 2:38 PM

## 2021-11-27 ENCOUNTER — Encounter (HOSPITAL_COMMUNITY): Payer: Self-pay

## 2021-11-27 ENCOUNTER — Ambulatory Visit (HOSPITAL_COMMUNITY)
Admission: RE | Admit: 2021-11-27 | Discharge: 2021-11-27 | Disposition: A | Discharge status: Home / Self Care | Payer: Managed Care, Other (non HMO) | Source: Ambulatory Visit | Attending: Nephrology | Admitting: Nephrology

## 2021-11-27 ENCOUNTER — Other Ambulatory Visit: Payer: Self-pay

## 2021-11-27 DIAGNOSIS — N186 End stage renal disease: Secondary | ICD-10-CM

## 2021-11-27 NOTE — Consult Note (Signed)
Chief Complaint: Patient was seen in consultation today for bleeding HD catheter at the request of [place]  History of Present Illness: Heather [name] is a 50 y.o. female who has  significant pmh including DM, CVA, ESRD on dialysis, HTN, pancreatitis, PE, and right BKA. She had a L IJ HD catheter placed at [facility] in 07/2021.  Since, she says she has had small amounts of bloody drainage around catheter insertion site and this week experienced a larger amount of blood on her dressing than previously.  Additionally, she says she was told at dialysis that her tube "was too low".  The catheter has worked appropriately at dialysis thus far.  She goes on to report a scheduled graft procedure in the next couple of weeks, eliminating need for the IJ catheter.  She denies fever, chills, N/V or other symptoms of acute illness. She presents to IR today for evaluation of HD catheter.  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[place]), nonhemorrhagic, inferior right cerebellum 12/03/2017   left side weakness in leg   Cyst of left ovary 01/12/2018   Diabetic neuropathy, painful ([place]), on low dose Gabapentin 07/13/2013   DM (diabetes mellitus), type 2, uncontrolled, with renal complications 97/01/6377   DM2 (diabetes mellitus, type 2) ([place])    Dysfunction of left eustachian tube, with pusatile tinnitus 11/24/2017   Dyslipidemia associated with type 2 diabetes mellitus ([place]) 06/25/2016   ESRD (end stage renal disease) on dialysis [facility])    On hemodialysis in July 2019 via [facility] then switched to [facility] in Nov 2019.    ESRD with anemia (HCC)    Fibromyalgia    Gastroparesis due to DM    GERD (gastroesophageal reflux disease)    Headache    Hypertension associated with diabetes ([place]) 02/19/2011   IBS  (irritable bowel syndrome)    Left-sided weakness 12/10/2017   .   Leiomyoma of uterus    Pancreatitis    PE (pulmonary thromboembolism) ([place]) 11/04/2019   Pneumonia    Proliferative diabetic retinopathy ([place]) 09/29/2015   Pronation deformity of both feet 06/14/2014   Reactive depression 12/10/2017   .   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[place]) 02/17/2018   Thromboembolism ([place]) 04/05/2018   Upper airway cough syndrome, with recs to stay off ACE and take Pepcid q hs 11/15/2016    Past Surgical History:  Procedure Laterality Date   A/V FISTULAGRAM Right 03/03/2020   Procedure: Venogram;  Surgeon: Waynetta Sandy, MD;  Location: [place] [facility];  Service: Cardiovascular;  Laterality: Right;   ARTERY REPAIR Right 02/17/2018   Procedure: EXPLORATION OF RIGHT BRACHIAL ARTERY;  Surgeon: Conrad Monroe, MD;  Location: [place];  Service: Vascular;  Laterality: Right;   ARTERY REPAIR Right 02/17/2018   Procedure: BRACHIAL ARTERY EXPLORATION AND TRHOMBECTOMY;  Surgeon: Conrad Hardin, MD;  Location: [place];  Service: Vascular;  Laterality: Right;   AV FISTULA PLACEMENT Left 05/09/2017   Procedure: INSERTION OF ARTERIOVENOUS (AV) GRAFT ARM (ARTEGRAFT);  Surgeon: Conrad [place], MD;  Location: [facility] OR;  Service: Vascular;  Laterality: Left;   AV FISTULA PLACEMENT Right 02/17/2018   Procedure: INSERTION OF ARTERIOVENOUS (AV) GORE-TEX GRAFT ARM RIGHT UPPER ARM;  Surgeon: Adele Barthel  L, MD;  Location: [name];  Service: Vascular;  Laterality: Right;   AV FISTULA PLACEMENT Left 10/10/2019   Procedure: INSERTION OF LEFT ARTERIOVENOUS (AV) ARTEGRAFT GRAFT ARM;  Surgeon: Waynetta Sandy, MD;  Location: [place];  Service: Vascular;  Laterality: Left;   AV FISTULA PLACEMENT Right 03/19/2020   Procedure: INSERTION OF ARTERIOVENOUS (AV) GORE-TEX GRAFT IN RIGHT ARM AND VIABAHN STENT IN RIGHT AXILLARY ARTERY;   Surgeon: Waynetta Sandy, MD;  Location: [place];  Service: Vascular;  Laterality: Right;   [place] Left 08/31/2016   Procedure: BASILIC VEIN TRANSPOSITION FIRST STAGE;  Surgeon: Conrad Webster, MD;  Location: [place];  Service: Vascular;  Laterality: Left;   BRONCHIAL WASHINGS  09/17/2021   Procedure: BRONCHIAL WASHINGS;  Surgeon: Collene Gobble, MD;  Location: [facility];  Service: Cardiopulmonary;;   CENTRAL VENOUS CATHETER INSERTION Left 07/24/2018   Procedure: INSERTION CENTRAL LINE ADULT;  Surgeon: Conrad [place], MD;  Location: [place];  Service: Vascular;  Laterality: Left;   EYE SURGERY     secondary to diabetic retinopathy    FOOT SURGERY Right    t"ook bone out- maybe hammer toe"   INSERTION OF DIALYSIS CATHETER Left 07/24/2018   Procedure: INSERTION OF TUNNELED DIALYSIS CATHETER;  Surgeon: Conrad [place], MD;  Location: [place];  Service: Vascular;  Laterality: Left;   IR FLUORO GUIDE CV LINE RIGHT  07/21/2018   IR FLUORO GUIDE CV LINE RIGHT  06/01/2019   IR FLUORO GUIDE CV LINE RIGHT  05/05/2020   IR REMOVAL TUN CV CATH W/O FL  01/23/2021   IR US GUIDE VASC ACCESS RIGHT  07/21/2018   IR US GUIDE VASC ACCESS RIGHT  06/01/2019   LIGATION ARTERIOVENOUS GORTEX GRAFT Right 03/20/2020   Procedure: LIGATION ARM ARTERIOVENOUS GORTEX GRAFT With Patch Angioplasty, Thrombectomy;  Surgeon: Waynetta Sandy, MD;  Location: [place];  Service: Vascular;  Laterality: Right;   LIGATION OF ARTERIOVENOUS  FISTULA Left 05/09/2017   Procedure: LIGATION OF ARTERIOVENOUS  FISTULA;  Surgeon: Conrad [place], MD;  Location: [place];  Service: Vascular;  Laterality: Left;   LOOP RECORDER INSERTION N/A 12/05/2017   Procedure: LOOP RECORDER INSERTION;  Surgeon: Evans Lance, MD;  Location: [place] CV [facility];  Service: Cardiovascular;  Laterality: N/A;   MYOMECTOMY     REMOVAL OF GRAFT Right 02/17/2018   Procedure: REMOVAL OF RIGHT UPPER ARM ARTERIOVENOUS GRAFT;  Surgeon: Conrad Boyle, MD;   Location: [place];  Service: Vascular;  Laterality: Right;   REVISION OF ARTERIOVENOUS GORETEX GRAFT Left 07/24/2018   Procedure: REDO ARTERIOVENOUS GORETEX GRAFT;  Surgeon: Conrad [place], MD;  Location: [place];  Service: Vascular;  Laterality: Left;   TEE WITHOUT CARDIOVERSION N/A 12/05/2017   Procedure: TRANSESOPHAGEAL ECHOCARDIOGRAM (TEE);  Surgeon: Sanda Klein, MD;  Location: [place];  Service: Cardiovascular;  Laterality: N/A;   UPPER EXTREMITY VENOGRAPHY Left 07/13/2018   Procedure: UPPER EXTREMITY VENOGRAPHY - Central & Left Arm;  Surgeon: Conrad , MD;  Location: [place] [facility];  Service: Cardiovascular;  Laterality: Left;   UPPER EXTREMITY VENOGRAPHY Bilateral 09/10/2019   Procedure: UPPER EXTREMITY VENOGRAPHY;  Surgeon: Waynetta Sandy, MD;  Location: [place] [facility];  Service: Cardiovascular;  Laterality: Bilateral;   UTERINE FIBROID SURGERY     VIDEO BRONCHOSCOPY N/A 09/17/2021   Procedure: VIDEO BRONCHOSCOPY WITHOUT FLUORO;  Surgeon: Collene Gobble, MD;  [facility]: [facility];  Service: Cardiopulmonary;  Laterality: N/A;   VITRECTOMY  Bilateral     Allergies: Ibuprofen, Dilaudid [hydromorphone hcl], Iodinated diagnostic agents, and Tramadol  Medications: Prior to Admission medications   Medication Sig Start Date End Date Taking? Authorizing Provider  calcium acetate (PHOSLO) 667 MG capsule Take 667 mg by mouth 3 (three) times daily with meals. 08/25/21   [provider]  Continuous Blood Gluc Sensor (FREESTYLE LIBRE 14 DAY SENSOR) MISC 1 patch by Does not apply route every 14 (fourteen) days. 07/10/21   [name], MD  doxycycline (VIBRA-TABS) 100 MG tablet Take 1 tablet (100 mg total) by mouth 2 (two) times daily. 10/28/21   Inda Coke, PA  gabapentin (NEURONTIN) 300 MG capsule Take 1 capsule (300 mg total) by mouth 3 (three) times daily. Take after dialysis. Do not take more than three times weekly. 09/17/21   Collene Gobble, MD   insulin aspart (NOVOLOG FLEXPEN) 100 UNIT/ML FlexPen Inject 6 Units into the skin 3 (three) times daily with meals. Sliding scale 07/16/20   [name], MD  insulin degludec (TRESIBA FLEXTOUCH) 100 UNIT/ML SOPN FlexTouch Pen Inject 0.26 mLs (26 Units total) into the skin at bedtime. 12/26/19   Shamleffer, Melanie Crazier, MD  Insulin Pen Needle 29G X 5MM MISC 1 Device by Does not apply route 4 (four) times daily. 12/26/19   Shamleffer, Melanie Crazier, MD  metroNIDAZOLE (METROGEL) 0.75 % vaginal gel Insert applicatorful hs x 5 nights. 10/28/21   Salvadore Dom, MD  midodrine (PROAMATINE) 5 MG tablet Take 1 tablet (5 mg total) by mouth See admin instructions. Given at Dialysis if needed 09/17/21   Collene Gobble, MD  pantoprazole (PROTONIX) 40 MG tablet Take 1 tablet (40 mg total) by mouth daily. 11/05/21   Autry-Lott, Naaman Plummer, DO  predniSONE (DELTASONE) 50 MG tablet One tablet 13 hours, 7 hours and 1 hour prior to procedure. 11/25/21   Theresa Duty, NP     Family History  Problem Relation Age of Onset   Colon polyps Mother    Diabetes Mother    Heart murmur Father        history essentially unknown   Hypertension Father    Diabetes Sister    Kidney failure Sister        kidney transplant   Diabetes Brother    Retinal degeneration Brother    Heart murmur Son    Stroke Paternal Grandmother    Diabetes Sister     Social History   Socioeconomic History   Marital status: Married    Spouse name: Not on file   Number of children: 1   Years of education: college   Highest education level: Not on file  Occupational History   Occupation: customer service rep    Employer: [employer]  Tobacco Use   Smoking status: Never   Smokeless tobacco: Never  Vaping Use   Vaping Use: Never used  Substance and Sexual Activity   Alcohol use: No    Alcohol/week: 0.0 standard drinks   Drug use: No   Sexual activity: Not Currently    Partners: Male    Birth  control/protection: I.U.D.  Other Topics Concern   Not on file  Social History Narrative   Patient lives with fiance. She has 1 grown son. Works full-time, she Paramedic for attorney.   Caffeine Use: 2 cups daily; sodas occasionally   She has 7 grandchildren   Social Determinants of Radio broadcast assistant Strain: Not on file  Food Insecurity: Not on file  Transportation Needs: Not on file  Physical Activity: Not on file  Stress: Not on file  Social Connections: Not on file      Review of Systems: A 12 point ROS discussed and pertinent positives are indicated in the HPI above.  All other systems are negative.  Vital Signs: Not taken this visit  Physical Exam:  Patient lying comfortably in stretcher and in NAD.  The HD catheter is exposed for examination.  The insertion site is clean and dry and the skin is intact. There is no evidence of drainage around the catheter.  No redness or erythema.  There is no stitch in place.  The cuff is palpable under the skin and not exposed.  Catheter appears to have been retracted approximately 2 cm.      Imaging: DG Abd 1 View  Result Date: 11/12/2021 CLINICAL DATA:  Missing IUD.  Not seen on ultrasound. EXAM: ABDOMEN - 1 VIEW COMPARISON:  Pelvis x-ray 08/13/2020. FINDINGS: No radiopaque foreign body identified. There are vascular calcifications in the pelvis. Bowel-gas pattern is normal. No acute fractures are seen. There is mild levoconvex curvature of the lumbar spine. IMPRESSION: 1. No foreign body/IUD visualized. Electronically Signed   By: Ronney Asters M.D.   On: 11/12/2021 21:08    Labs:  CBC: Recent Labs    07/29/21 1705 10/16/21 1703 11/05/21 1034  WBC 8.0 10.9* 6.7  HGB 9.5* 8.8* 9.1*  HCT 30.3* 29.5* 29.6*  PLT 269 196 190    COAGS: No results for input(s): INR, APTT in the last 8760 hours.  BMP: Recent Labs    07/20/21 0317 07/29/21 1705 10/16/21 1703 11/05/21 1034  NA 134* 136 140 138  K 4.7 4.3 5.1  4.0  CL 94* 96* 98 96*  CO2 28 25 28 31   GLUCOSE 250* 115* 237* 121*  BUN 37* 59* 25* 40*  CALCIUM 9.1 8.5* 9.3 9.6  CREATININE 9.94* 14.89* 9.01* 7.94*  GFRNONAA 4* 3* 5* 6*    LIVER FUNCTION TESTS: Recent Labs    10/16/21 1703 11/05/21 1034  BILITOT 0.4 0.6  AST 24 30  ALT 26 46*  ALKPHOS 107 98  PROT 8.7* 7.9  ALBUMIN 3.4* 4.0    Assessment and Plan:  ESRD on dialysis --Has current L IJ HD catheter that is functioning.  No current bleeding or drainage, no evidence for infection, cuff is not exposed. --Pt will continue to manage drainage at home.  --Is scheduled for graft procedure in 2 weeks.  --No intervention necessary at this time.    Thank you for this interesting consult.  I greatly enjoyed meeting TATA TIMMINS and look forward to participating in their care.  A copy of this report was sent to the requesting provider on this date.  Electronically SignedPasty Spillers 11/27/2021, 1:59 PM   I spent a total of 30 Minutes  in face to face in clinical consultation, greater than 50% of which was counseling/coordinating care for HD catheter evaluation.

## 2021-12-17 ENCOUNTER — Emergency Department (HOSPITAL_COMMUNITY)
Admission: EM | Admit: 2021-12-17 | Discharge: 2021-12-17 | Discharge status: Against Medical Advice | Payer: Medicare Other | Attending: Emergency Medicine | Admitting: Emergency Medicine

## 2021-12-17 ENCOUNTER — Emergency Department (HOSPITAL_COMMUNITY): Payer: Medicare Other

## 2021-12-17 ENCOUNTER — Encounter (HOSPITAL_COMMUNITY): Payer: Self-pay | Admitting: *Deleted

## 2021-12-17 DIAGNOSIS — N186 End stage renal disease: Secondary | ICD-10-CM | POA: Diagnosis not present

## 2021-12-17 DIAGNOSIS — Z992 Dependence on renal dialysis: Secondary | ICD-10-CM | POA: Diagnosis not present

## 2021-12-17 DIAGNOSIS — Z794 Long term (current) use of insulin: Secondary | ICD-10-CM | POA: Diagnosis not present

## 2021-12-17 DIAGNOSIS — E1122 Type 2 diabetes mellitus with diabetic chronic kidney disease: Secondary | ICD-10-CM | POA: Insufficient documentation

## 2021-12-17 DIAGNOSIS — T82598A Other mechanical complication of other cardiac and vascular devices and implants, initial encounter: Secondary | ICD-10-CM | POA: Diagnosis not present

## 2021-12-17 DIAGNOSIS — Z789 Other specified health status: Secondary | ICD-10-CM

## 2021-12-17 DIAGNOSIS — I120 Hypertensive chronic kidney disease with stage 5 chronic kidney disease or end stage renal disease: Secondary | ICD-10-CM | POA: Diagnosis not present

## 2021-12-17 DIAGNOSIS — Y828 Other medical devices associated with adverse incidents: Secondary | ICD-10-CM | POA: Insufficient documentation

## 2021-12-17 DIAGNOSIS — E11319 Type 2 diabetes mellitus with unspecified diabetic retinopathy without macular edema: Secondary | ICD-10-CM | POA: Insufficient documentation

## 2021-12-17 DIAGNOSIS — E1140 Type 2 diabetes mellitus with diabetic neuropathy, unspecified: Secondary | ICD-10-CM | POA: Insufficient documentation

## 2021-12-17 DIAGNOSIS — Z8541 Personal history of malignant neoplasm of cervix uteri: Secondary | ICD-10-CM | POA: Diagnosis not present

## 2021-12-17 NOTE — ED Provider Notes (Signed)
[facility] EMERGENCY DEPARTMENT Provider Note   CSN: [id number] Arrival date & time: 12/17/21  4854     History Chief Complaint  Patient presents with   Vascular Access Problem   Chest Pain    Heather [name] is a 50 y.o. female.  Patient with history of diabetes, CVA, and ESRD on hemodialysis presents today with vascular access problem. She states that she was at her dialysis center this morning and when  staff connected her port to the machine her catheter came partially out. Staff then taped the catheter in place and sent her here for management. Additionally, she states that her port has been causing her pain since before her last dialysis treatment on Tuesday, however she was able to complete her dialysis at that time. Staff at the dialysis center said they felt her site was swollen but did not see any signs of infection. She states that this port has been in place for several months and has been without complication up to this point. She states that her pain is localized around her port site. She denies fevers, chills, chest pain, shortness of breath.  The history is provided by the patient. No language interpreter was used.  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[place]), nonhemorrhagic, inferior right cerebellum 12/03/2017   left side weakness in leg   Cyst of left ovary 01/12/2018   Diabetic neuropathy, painful ([place]), on low dose Gabapentin 07/13/2013   DM (diabetes mellitus), type 2, uncontrolled, with renal complications 62/70/3500   DM2 (diabetes mellitus, type 2) ([place])    Dysfunction of left eustachian tube, with pusatile tinnitus  11/24/2017   Dyslipidemia associated with type 2 diabetes mellitus ([place]) 06/25/2016   ESRD (end stage renal disease) on dialysis [facility])    On hemodialysis in July 2019 via [facility] then switched to [facility] in Nov 2019.    ESRD with anemia (HCC)    Fibromyalgia    Gastroparesis due to DM    GERD (gastroesophageal reflux disease)    Headache    Hypertension associated with diabetes ([place]) 02/19/2011   IBS (irritable bowel syndrome)    Left-sided weakness 12/10/2017   .   Leiomyoma of uterus    Pancreatitis    PE (pulmonary thromboembolism) ([place]) 11/04/2019   Pneumonia    Proliferative diabetic retinopathy ([place]) 09/29/2015   Pronation deformity of both feet 06/14/2014   Reactive depression 12/10/2017   .   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[place]) 02/17/2018   Thromboembolism ([place]) 04/05/2018   Upper airway cough syndrome, with recs to stay off ACE and take Pepcid q hs 11/15/2016    Patient Active Problem List   Diagnosis Date Noted   S/P BKA (below knee amputation), right ([place]) 03/31/2021   Impaired mobility and ADLs 03/25/2021   CVA (cerebral vascular accident) ([place]) 03/04/2021   Pulmonary embolus ([place]) 03/04/2021   Charcot ankle, right 02/23/2021   Hx of cardiac arrest 11/12/2020   Abnormal CT of the chest 09/26/2020   Allergy, unspecified, initial encounter 09/22/2020   Anaphylactic shock, unspecified, initial encounter 09/22/2020  Fluid overload, unspecified 06/10/2020   Other mechanical complication of other cardiac and vascular devices and implants, initial encounter ([employer]) 05/10/2020   Positive ANA (antinuclear antibody) 04/16/2020   ESRD on dialysis [facility])    Hemiplegia and hemiparesis following cerebral infarction affecting left non-dominant side ([place]) 12/29/2018   IBS (irritable bowel syndrome) 08/06/2017   Dyslipidemia associated with type 2 diabetes mellitus ([place]) 06/25/2016    Proliferative diabetic retinopathy ([place]) 09/29/2015   Chronic cough 10/04/2014   B12 deficiency 05/10/2014   Anemia in chronic kidney disease 04/26/2014   CKD (chronic kidney disease) stage 5, GFR less than 15 ml/min (HCC) 04/26/2014   Secondary hyperparathyroidism of renal origin ([place]) 11/27/2013   Posterior subcapsular cataract, bilateral 10/18/2013   Diabetic neuropathy, painful ([place]) 07/13/2013   Diabetes mellitus, type II, insulin dependent ([place]), on CGM, with end organ damage: renal, neuropathy, gastroparesis, retinopathy 02/19/2011   Hypertension associated with diabetes ([place]) 02/19/2011   Gastroparesis due to DM ([place]) 02/19/2011    Past Surgical History:  Procedure Laterality Date   A/V FISTULAGRAM Right 03/03/2020   Procedure: Venogram;  Surgeon: Waynetta Sandy, MD;  Location: [place] [facility];  Service: Cardiovascular;  Laterality: Right;   ARTERY REPAIR Right 02/17/2018   Procedure: EXPLORATION OF RIGHT BRACHIAL ARTERY;  Surgeon: Conrad Butler, MD;  Location: [place];  Service: Vascular;  Laterality: Right;   ARTERY REPAIR Right 02/17/2018   Procedure: BRACHIAL ARTERY EXPLORATION AND TRHOMBECTOMY;  Surgeon: Conrad Delphos, MD;  Location: [name];  Service: Vascular;  Laterality: Right;   AV FISTULA PLACEMENT Left 05/09/2017   Procedure: INSERTION OF ARTERIOVENOUS (AV) GRAFT ARM (ARTEGRAFT);  Surgeon: Conrad Perry, MD;  Location: [place];  Service: Vascular;  Laterality: Left;   AV FISTULA PLACEMENT Right 02/17/2018   Procedure: INSERTION OF ARTERIOVENOUS (AV) GORE-TEX GRAFT ARM RIGHT UPPER ARM;  Surgeon: Conrad Ventura, MD;  Location: [place];  Service: Vascular;  Laterality: Right;   AV FISTULA PLACEMENT Left 10/10/2019   Procedure: INSERTION OF LEFT ARTERIOVENOUS (AV) ARTEGRAFT GRAFT ARM;  Surgeon: Waynetta Sandy, MD;  Location: [place];  Service: Vascular;  Laterality: Left;   AV FISTULA PLACEMENT Right 03/19/2020   Procedure: INSERTION OF ARTERIOVENOUS (AV) GORE-TEX  GRAFT IN RIGHT ARM AND VIABAHN STENT IN RIGHT AXILLARY ARTERY;  Surgeon: Waynetta Sandy, MD;  Location: [place];  Service: Vascular;  Laterality: Right;   [place] Left 08/31/2016   Procedure: BASILIC VEIN TRANSPOSITION FIRST STAGE;  Surgeon: Conrad [place] Grove, MD;  Location: [place];  Service: Vascular;  Laterality: Left;   BRONCHIAL WASHINGS  09/17/2021   Procedure: BRONCHIAL WASHINGS;  Surgeon: Collene Gobble, MD;  Location: [facility];  Service: Cardiopulmonary;;   CENTRAL VENOUS CATHETER INSERTION Left 07/24/2018   Procedure: INSERTION CENTRAL LINE ADULT;  Surgeon: Conrad Martin, MD;  Location: [place];  Service: Vascular;  Laterality: Left;   EYE SURGERY     secondary to diabetic retinopathy    FOOT SURGERY Right    t"ook bone out- maybe hammer toe"   INSERTION OF DIALYSIS CATHETER Left 07/24/2018   Procedure: INSERTION OF TUNNELED DIALYSIS CATHETER;  Surgeon: Conrad Bayport, MD;  Location: [place];  Service: Vascular;  Laterality: Left;   IR FLUORO GUIDE CV LINE RIGHT  07/21/2018   IR FLUORO GUIDE CV LINE RIGHT  06/01/2019   IR FLUORO GUIDE CV LINE RIGHT  05/05/2020   IR REMOVAL TUN CV CATH W/O FL  01/23/2021   IR  US GUIDE VASC ACCESS RIGHT  07/21/2018   IR US GUIDE VASC ACCESS RIGHT  06/01/2019   LIGATION ARTERIOVENOUS GORTEX GRAFT Right 03/20/2020   Procedure: LIGATION ARM ARTERIOVENOUS GORTEX GRAFT With Patch Angioplasty, Thrombectomy;  Surgeon: Waynetta Sandy, MD;  Location: [place];  Service: Vascular;  Laterality: Right;   LIGATION OF ARTERIOVENOUS  FISTULA Left 05/09/2017   Procedure: LIGATION OF ARTERIOVENOUS  FISTULA;  Surgeon: Conrad Georgetown, MD;  Location: [place];  Service: Vascular;  Laterality: Left;   LOOP RECORDER INSERTION N/A 12/05/2017   Procedure: LOOP RECORDER INSERTION;  Surgeon: Evans Lance, MD;  Location: [place] [facility];  Service: Cardiovascular;  Laterality: N/A;   MYOMECTOMY     REMOVAL OF GRAFT Right 02/17/2018   Procedure: REMOVAL OF  RIGHT UPPER ARM ARTERIOVENOUS GRAFT;  Surgeon: Conrad Pelham, MD;  Location: [place];  Service: Vascular;  Laterality: Right;   REVISION OF ARTERIOVENOUS GORETEX GRAFT Left 07/24/2018   Procedure: REDO ARTERIOVENOUS GORETEX GRAFT;  Surgeon: Conrad Eclectic, MD;  Location: [place];  Service: Vascular;  Laterality: Left;   TEE WITHOUT CARDIOVERSION N/A 12/05/2017   Procedure: TRANSESOPHAGEAL ECHOCARDIOGRAM (TEE);  Surgeon: Sanda Klein, MD;  Location: [place];  Service: Cardiovascular;  Laterality: N/A;   UPPER EXTREMITY VENOGRAPHY Left 07/13/2018   Procedure: UPPER EXTREMITY VENOGRAPHY - Central & Left Arm;  Surgeon: Conrad Clifton, MD;  Location: [place] [facility];  Service: Cardiovascular;  Laterality: Left;   UPPER EXTREMITY VENOGRAPHY Bilateral 09/10/2019   Procedure: UPPER EXTREMITY VENOGRAPHY;  Surgeon: Waynetta Sandy, MD;  Location: [place] [facility];  Service: Cardiovascular;  Laterality: Bilateral;   UTERINE FIBROID SURGERY     VIDEO BRONCHOSCOPY N/A 09/17/2021   Procedure: VIDEO BRONCHOSCOPY WITHOUT FLUORO;  Surgeon: Collene Gobble, MD;  [facility];  Service: Cardiopulmonary;  Laterality: N/A;   VITRECTOMY Bilateral      OB History     Gravida  2   Para  1   Term      Preterm      AB  1   Living  1      SAB  1   IAB      Ectopic      Multiple      Live Births              Family History  Problem Relation Age of Onset   Colon polyps Mother    Diabetes Mother    Heart murmur Father        history essentially unknown   Hypertension Father    Diabetes Sister    Kidney failure Sister        kidney transplant   Diabetes Brother    Retinal degeneration Brother    Heart murmur Son    Stroke Paternal Grandmother    Diabetes Sister     Social History   Tobacco Use   Smoking status: Never   Smokeless tobacco: Never  Vaping Use   Vaping Use: Never used  Substance Use Topics   Alcohol use: No    Alcohol/week: 0.0 standard  drinks   Drug use: No    Home Medications Prior to Admission medications   Medication Sig Start Date End Date Taking? Authorizing Provider  calcium acetate (PHOSLO) 667 MG capsule Take 667 mg by mouth 3 (three) times daily with meals. 08/25/21   [provider]  Continuous Blood Gluc Sensor (FREESTYLE LIBRE 14 DAY SENSOR) MISC 1 patch  by Does not apply route every 14 (fourteen) days. 07/10/21   [name], MD  doxycycline (VIBRA-TABS) 100 MG tablet Take 1 tablet (100 mg total) by mouth 2 (two) times daily. 10/28/21   Inda Coke, PA  gabapentin (NEURONTIN) 300 MG capsule Take 1 capsule (300 mg total) by mouth 3 (three) times daily. Take after dialysis. Do not take more than three times weekly. 09/17/21   Collene Gobble, MD  insulin aspart (NOVOLOG FLEXPEN) 100 UNIT/ML FlexPen Inject 6 Units into the skin 3 (three) times daily with meals. Sliding scale 07/16/20   [name], MD  insulin degludec (TRESIBA FLEXTOUCH) 100 UNIT/ML SOPN FlexTouch Pen Inject 0.26 mLs (26 Units total) into the skin at bedtime. 12/26/19   Shamleffer, Melanie Crazier, MD  Insulin Pen Needle 29G X 5MM MISC 1 Device by Does not apply route 4 (four) times daily. 12/26/19   Shamleffer, Melanie Crazier, MD  metroNIDAZOLE (METROGEL) 0.75 % vaginal gel Insert applicatorful hs x 5 nights. 10/28/21   Salvadore Dom, MD  midodrine (PROAMATINE) 5 MG tablet Take 1 tablet (5 mg total) by mouth See admin instructions. Given at Dialysis if needed 09/17/21   Collene Gobble, MD  pantoprazole (PROTONIX) 40 MG tablet Take 1 tablet (40 mg total) by mouth daily. 11/05/21   Autry-Lott, Naaman Plummer, DO  predniSONE (DELTASONE) 50 MG tablet One tablet 13 hours, 7 hours and 1 hour prior to procedure. 11/25/21   Theresa Duty, NP    Allergies    Ibuprofen, Dilaudid [hydromorphone hcl], Iodinated diagnostic agents, and Tramadol  Review of Systems   Review of Systems  Constitutional:  Negative for chills and fever.   Respiratory:  Negative for cough, choking, chest tightness, shortness of breath, wheezing and stridor.   Cardiovascular:  Negative for chest pain.  Gastrointestinal:  Negative for nausea and vomiting.  Allergic/Immunologic: Positive for immunocompromised state.  Neurological:  Negative for seizures, syncope, speech difficulty and headaches.  Psychiatric/Behavioral:  Negative for confusion and decreased concentration.   All other systems reviewed and are negative.  Physical Exam Updated Vital Signs BP (!) 218/108 (BP Location: Right Arm)    Pulse 88    Temp 97.9 F (36.6 C) (Oral)    Resp 16    LMP  (LMP Unknown)    SpO2 97%   Physical Exam Vitals and nursing note reviewed.  Constitutional:      Appearance: [name] well-developed and normal weight.  HENT:     Head: Normocephalic and atraumatic.  Eyes:     Extraocular Movements: Extraocular movements intact.  Cardiovascular:     Rate and Rhythm: Normal rate and regular rhythm.     Heart sounds: Normal heart sounds.  Pulmonary:     Effort: Pulmonary effort is normal.     Breath sounds: Normal breath sounds.  Chest:     Comments: Hemodialysis port located on the left chest approximately 8 cm displaced from the skin taped in place. Tenderness noted around port site without erythema, swelling, or drainage. Musculoskeletal:     Cervical back: Normal range of motion.  Neurological:     Mental Status: [name] alert.    ED Results / Procedures / Treatments   Labs (all labs ordered are listed, but only abnormal results are displayed) Labs Reviewed - No data to display  EKG None  Radiology DG Chest 2 View  Result Date: 12/17/2021 CLINICAL DATA:  Dialysis catheter partially out EXAM: CHEST - 2 VIEW COMPARISON:  09/25/2021 FINDINGS:  Left dialysis catheter has pulled back. The tip is in the left upper chest. It is difficult to determine if this is chest into the left internal jugular vein or outside the vein in the left upper chest  wall. Cardiomegaly. No confluent airspace opacities, effusions or edema. IMPRESSION: Tip of the left dialysis catheter is in the left upper chest and could be outside the vein or just into the left internal jugular vein. No acute cardiopulmonary disease. Electronically Signed   By: Rolm Baptise M.D.   On: 12/17/2021 10:48    Procedures Procedures   Medications Ordered in ED Medications - No data to display  ED Course  I have reviewed the triage vital signs and the nursing notes.  Pertinent labs & imaging results that were available during my care of the patient were reviewed by me and considered in my medical decision making (see chart for details).    MDM Rules/Calculators/A&P                         Hemodialysis patient presents today with her hemodialysis port displaced from her chest.  States that port became displaced from her chest when she was at dialysis this morning.  She did not receive dialysis this morning.  Access site is tender to palpation, however no signs of infection. Port was placed at [facility].  CXR reveals that tip of catheter is either in the left IJ or just outside the vein.  Discussed patient with vascular surgery who states they are unable to replace catheter today, requests IR consult to see if they are available.  Discussed patient with IR who states they are also booked for the day and are unable to replace her catheter. They recommend admission with potential to fit her in today or tomorrow for port replacement.  Discussed admission with patient. After thorough discussion, she states that she does not want to be admitted as she is not currently symptomatic. She also refuses lab work at this time as she states she is a hard stick and 'doesnt want to be here all day.'   Called IR back to potentially schedule outpatient port replacement, Dr. Laurence Ferrari stated that he had passed the case along to his PA who he would like to see the patient and schedule further  management. Informed patient of this who stated that she did not want to wait for IR evaluation and she will instead 'take her chances' and return tomorrow. We discussed the nature and purpose, risks and benefits, as well as, the alternatives of treatment. Time was given to allow the opportunity to ask questions and consider their options, and after the discussion, the patient decided to refuse the offerred treatment. The patient was informed that refusal could lead to, but was not limited to, death, permanent disability, or severe pain. If present, I asked the relatives or significant others to dissuade them without success. Prior to refusing, I determined that the patient had the capacity to make their decision and understood the consequences of that decision. After refusal, I made every reasonable opportunity to treat them to the best of my ability.  The patient was notified that they may return to the emergency department at any time for further treatment.     This is a shared visit with supervising physician Dr. Johnney Killian who has independently evaluated patient & provided guidance in evaluation/management/disposition, in agreement with care      Final Clinical Impression(s) / ED Diagnoses Final diagnoses:  Problem with vascular access    Rx / DC Orders ED Discharge Orders     None        Nestor Lewandowsky 12/17/21 1257    Charlesetta Shanks, MD 12/22/21 [phone]

## 2021-12-17 NOTE — ED Triage Notes (Signed)
Pt's dialysis catheter port is slowing exiting, out about a foot. Last dialysis was Tuesday. Dry weight was met.

## 2021-12-17 NOTE — Discharge Instructions (Signed)
You will need to return to have your hemodialysis port replaced as dialysis is a life-saving measure that you require on a frequent basis.   Please return ASAP for management of this.

## 2021-12-17 NOTE — ED Notes (Signed)
Patient transported to X-ray 

## 2021-12-18 ENCOUNTER — Emergency Department (HOSPITAL_COMMUNITY): Payer: Medicare Other

## 2021-12-18 ENCOUNTER — Other Ambulatory Visit: Payer: Self-pay

## 2021-12-18 ENCOUNTER — Encounter (HOSPITAL_COMMUNITY): Payer: Self-pay | Admitting: Emergency Medicine

## 2021-12-18 ENCOUNTER — Inpatient Hospital Stay (HOSPITAL_COMMUNITY)
Admission: EM | Admit: 2021-12-18 | Discharge: 2021-12-19 | DRG: 698 | Disposition: A | Discharge status: Home / Self Care | Payer: Medicare Other | Attending: Family Medicine | Admitting: Family Medicine

## 2021-12-18 DIAGNOSIS — T8242XA Displacement of vascular dialysis catheter, initial encounter: Secondary | ICD-10-CM | POA: Diagnosis not present

## 2021-12-18 DIAGNOSIS — Z841 Family history of disorders of kidney and ureter: Secondary | ICD-10-CM

## 2021-12-18 DIAGNOSIS — N186 End stage renal disease: Secondary | ICD-10-CM | POA: Diagnosis present

## 2021-12-18 DIAGNOSIS — N2581 Secondary hyperparathyroidism of renal origin: Secondary | ICD-10-CM | POA: Diagnosis present

## 2021-12-18 DIAGNOSIS — Z20822 Contact with and (suspected) exposure to covid-19: Secondary | ICD-10-CM | POA: Diagnosis not present

## 2021-12-18 DIAGNOSIS — Z79899 Other long term (current) drug therapy: Secondary | ICD-10-CM | POA: Diagnosis not present

## 2021-12-18 DIAGNOSIS — Z992 Dependence on renal dialysis: Secondary | ICD-10-CM | POA: Diagnosis not present

## 2021-12-18 DIAGNOSIS — Y929 Unspecified place or not applicable: Secondary | ICD-10-CM

## 2021-12-18 DIAGNOSIS — E119 Type 2 diabetes mellitus without complications: Secondary | ICD-10-CM

## 2021-12-18 DIAGNOSIS — I10 Essential (primary) hypertension: Secondary | ICD-10-CM

## 2021-12-18 DIAGNOSIS — M797 Fibromyalgia: Secondary | ICD-10-CM | POA: Diagnosis not present

## 2021-12-18 DIAGNOSIS — E669 Obesity, unspecified: Secondary | ICD-10-CM | POA: Diagnosis not present

## 2021-12-18 DIAGNOSIS — Y838 Other surgical procedures as the cause of abnormal reaction of the patient, or of later complication, without mention of misadventure at the time of the procedure: Secondary | ICD-10-CM | POA: Diagnosis present

## 2021-12-18 DIAGNOSIS — E875 Hyperkalemia: Secondary | ICD-10-CM | POA: Diagnosis present

## 2021-12-18 DIAGNOSIS — Z794 Long term (current) use of insulin: Secondary | ICD-10-CM

## 2021-12-18 DIAGNOSIS — E1122 Type 2 diabetes mellitus with diabetic chronic kidney disease: Secondary | ICD-10-CM | POA: Diagnosis present

## 2021-12-18 DIAGNOSIS — D631 Anemia in chronic kidney disease: Secondary | ICD-10-CM | POA: Diagnosis not present

## 2021-12-18 DIAGNOSIS — Z833 Family history of diabetes mellitus: Secondary | ICD-10-CM | POA: Diagnosis not present

## 2021-12-18 DIAGNOSIS — E11610 Type 2 diabetes mellitus with diabetic neuropathic arthropathy: Secondary | ICD-10-CM | POA: Diagnosis present

## 2021-12-18 DIAGNOSIS — Z6833 Body mass index (BMI) 33.0-33.9, adult: Secondary | ICD-10-CM | POA: Diagnosis not present

## 2021-12-18 DIAGNOSIS — Z888 Allergy status to other drugs, medicaments and biological substances status: Secondary | ICD-10-CM

## 2021-12-18 DIAGNOSIS — I160 Hypertensive urgency: Secondary | ICD-10-CM | POA: Diagnosis present

## 2021-12-18 DIAGNOSIS — Z823 Family history of stroke: Secondary | ICD-10-CM

## 2021-12-18 DIAGNOSIS — Z8249 Family history of ischemic heart disease and other diseases of the circulatory system: Secondary | ICD-10-CM

## 2021-12-18 DIAGNOSIS — Z885 Allergy status to narcotic agent status: Secondary | ICD-10-CM

## 2021-12-18 DIAGNOSIS — Z8673 Personal history of transient ischemic attack (TIA), and cerebral infarction without residual deficits: Secondary | ICD-10-CM | POA: Diagnosis not present

## 2021-12-18 DIAGNOSIS — Z89511 Acquired absence of right leg below knee: Secondary | ICD-10-CM

## 2021-12-18 DIAGNOSIS — T829XXA Unspecified complication of cardiac and vascular prosthetic device, implant and graft, initial encounter: Secondary | ICD-10-CM

## 2021-12-18 LAB — GLUCOSE, CAPILLARY: Glucose-Capillary: 151 mg/dL — ABNORMAL HIGH (ref 70–99)

## 2021-12-18 LAB — BASIC METABOLIC PANEL
Anion gap: 14 (ref 5–15)
BUN: 47 mg/dL — ABNORMAL HIGH (ref 6–20)
CO2: 20 mmol/L — ABNORMAL LOW (ref 22–32)
Calcium: 8.8 mg/dL — ABNORMAL LOW (ref 8.9–10.3)
Chloride: 102 mmol/L (ref 98–111)
Creatinine, Ser: 12.69 mg/dL — ABNORMAL HIGH (ref 0.44–1.00)
GFR, Estimated: 3 mL/min — ABNORMAL LOW (ref >60)
Glucose, Bld: 101 mg/dL — ABNORMAL HIGH (ref 70–99)
Potassium: 6 mmol/L — ABNORMAL HIGH (ref 3.5–5.1)
Sodium: 136 mmol/L (ref 135–145)

## 2021-12-18 LAB — CBC
HCT: 34.9 % — ABNORMAL LOW (ref 36.0–46.0)
Hemoglobin: 9.9 g/dL — ABNORMAL LOW (ref 12.0–15.0)
MCH: 30.4 pg (ref 26.0–34.0)
MCHC: 28.4 g/dL — ABNORMAL LOW (ref 30.0–36.0)
MCV: 107.1 fL — ABNORMAL HIGH (ref 80.0–100.0)
Platelets: UNDETERMINED 10*3/uL (ref 150–400)
RBC: 3.26 MIL/uL — ABNORMAL LOW (ref 3.87–5.11)
RDW: 16.1 % — ABNORMAL HIGH (ref 11.5–15.5)
WBC: 6.8 10*3/uL (ref 4.0–10.5)
nRBC: 0.6 % — ABNORMAL HIGH (ref 0.0–0.2)

## 2021-12-18 LAB — RESP PANEL BY RT-PCR (FLU A&B, COVID) ARPGX2
Influenza A by PCR: NEGATIVE
Influenza B by PCR: NEGATIVE
SARS Coronavirus 2 by RT PCR: NEGATIVE

## 2021-12-18 MED ORDER — TRAZODONE HCL 50 MG PO TABS
50.0000 mg | ORAL_TABLET | Freq: Every evening | ORAL | Status: DC | PRN
  Administered 2021-12-19: 05:00:00 50 mg via ORAL
  Filled 2021-12-18: qty 1

## 2021-12-18 MED ORDER — HYDRALAZINE HCL 50 MG PO TABS: 50.0000 mg | ORAL_TABLET | Freq: Four times a day (QID) | ORAL | Status: DC | PRN

## 2021-12-18 MED ORDER — INSULIN ASPART 100 UNIT/ML IJ SOLN: 0.0000 [IU] | Freq: Three times a day (TID) | INTRAMUSCULAR | Status: DC

## 2021-12-18 MED ORDER — ACETAMINOPHEN 650 MG RE SUPP: 650.0000 mg | Freq: Four times a day (QID) | RECTAL | Status: DC | PRN

## 2021-12-18 MED ORDER — SODIUM CHLORIDE 0.9% FLUSH
3.0000 mL | Freq: Two times a day (BID) | INTRAVENOUS | Status: DC
  Administered 2021-12-19: 10:00:00 3 mL via INTRAVENOUS

## 2021-12-18 MED ORDER — HYDRALAZINE HCL 20 MG/ML IJ SOLN: 20.0000 mg | INTRAMUSCULAR | Status: AC

## 2021-12-18 MED ORDER — SODIUM ZIRCONIUM CYCLOSILICATE 10 G PO PACK
10.0000 g | PACK | Freq: Three times a day (TID) | ORAL | Status: DC
  Administered 2021-12-18 – 2021-12-19 (×3): 10 g via ORAL
  Filled 2021-12-18 (×3): qty 1

## 2021-12-18 MED ORDER — ONDANSETRON HCL 4 MG PO TABS: 4.0000 mg | ORAL_TABLET | Freq: Four times a day (QID) | ORAL | Status: DC | PRN

## 2021-12-18 MED ORDER — ACETAMINOPHEN 325 MG PO TABS
650.0000 mg | ORAL_TABLET | Freq: Four times a day (QID) | ORAL | Status: DC | PRN
  Filled 2021-12-18: qty 2

## 2021-12-18 MED ORDER — ONDANSETRON HCL 4 MG/2ML IJ SOLN: 4.0000 mg | Freq: Four times a day (QID) | INTRAMUSCULAR | Status: DC | PRN

## 2021-12-18 MED ORDER — CLONIDINE HCL 0.2 MG PO TABS
0.2000 mg | ORAL_TABLET | ORAL | Status: AC
  Administered 2021-12-18: 19:00:00 0.2 mg via ORAL
  Filled 2021-12-18: qty 1

## 2021-12-18 NOTE — Progress Notes (Signed)
Patient has AV fistula on both arm. Patient's RN stated that it's okay to put PIV access on Rt. Arm per patient. Document showed new AV fistula is on Rt. Arm. Advised patient's RN to contact nephrologist to ask which arm is okay to put in the PIV access. When she get information, will put in the IV consult. HS [employer]

## 2021-12-18 NOTE — ED Triage Notes (Signed)
Pt here for her dialysis catheter port coming out of her L chest, port is hanging down by pt's belly button. States she was here yesterday for the same and was told to come back today. Pt gets dialysis Tues, Thurs, Sat, last went Tuesday. Pt reports tenderness at site

## 2021-12-18 NOTE — H&P (Signed)
History and Physical    Heather [name] BWG:[id number] DOB: 1971/04/30 DOA: 12/18/2021  PCP: [name], MD  Patient coming from: Home  I have personally briefly reviewed patient's old medical records in [place]  Chief Complaint: Displaced dialysis catheter  HPI: Heather [name] is a 50 y.o. female with medical history significant for ESRD on TTS HD via HD cath, history of CVA, insulin-dependent T2DM, HTN, Charcot foot s/p right BKA, history of DVT not on anticoagulation, anemia of chronic kidney disease who presented to the ED for evaluation of displaced dialysis catheter.  Patient states that she went to her usual HD session yesterday (12/22) when she noticed that her dialysis catheter was coming out of place.  They were unable to perform her dialysis.  She did go to the ED afterwards.  Vascular surgery and IR were consulted and they were unable to place port or HD cath same day.  IR were planning to see patient to schedule further management however she left the ED before being seen.  Patient returns for further management of her displaced dialysis catheter.  She has mild headache and has noticed some increase fluid in her legs but otherwise feels well.  She denies any chest pain, dyspnea, nausea, vomiting, abdominal pain.  She states that she still makes a small amount of urine.  ED Course:  Initial vitals showed BP 195/99, pulse 86, RR 17, temp 98.5 F, SPO2 100% on room air.  Labs show sodium 136, potassium 6.0, bicarb 20, BUN 47, creatinine 12.69, serum glucose 101, WBC 6.8, hemoglobin 9.9, platelets not reported due to clumps on smear.  2 view chest x-ray shows left dialysis catheter has retracted with tip likely extravascular in the left upper chest wall.  No focal consolidation, edema, effusion seen.  Nephrology were consulted and recommended [facility] for hyperkalemia, IR consult for [facility] placement, and medical admission.  EDP discussed with on-call IR who  will be able to place [facility] tomorrow (12/24).  The hospitalist service was consulted to admit for further evaluation and management.  Review of Systems: All systems reviewed and are negative except as documented in history of present illness above.   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[place]), nonhemorrhagic, inferior right cerebellum 12/03/2017   left side weakness in leg   Cyst of left ovary 01/12/2018   Diabetic neuropathy, painful ([place]), on low dose Gabapentin 07/13/2013   DM (diabetes mellitus), type 2, uncontrolled, with renal complications 17/79/3903   DM2 (diabetes mellitus, type 2) ([place])    Dysfunction of left eustachian tube, with pusatile tinnitus 11/24/2017   Dyslipidemia associated with type 2 diabetes mellitus ([place]) 06/25/2016   ESRD (end stage renal disease) on dialysis [facility])    On hemodialysis in July 2019 via [facility] then switched to [facility] in Nov 2019.    ESRD with anemia (HCC)    Fibromyalgia    Gastroparesis due to DM    GERD (gastroesophageal reflux disease)    Headache    Hypertension associated with diabetes ([place]) 02/19/2011   IBS (irritable bowel syndrome)    Left-sided weakness 12/10/2017   .   Leiomyoma of uterus    Pancreatitis    PE (pulmonary thromboembolism) ([place]) 11/04/2019   Pneumonia    Proliferative diabetic retinopathy ([place]) 09/29/2015   Pronation deformity  of both feet 06/14/2014   Reactive depression 12/10/2017   .   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[place]) 02/17/2018   Thromboembolism ([place]) 04/05/2018   Upper airway cough syndrome, with recs to stay off ACE and take Pepcid q hs 11/15/2016    Past Surgical History:  Procedure Laterality Date   A/V  FISTULAGRAM Right 03/03/2020   Procedure: Venogram;  Surgeon: Waynetta Sandy, MD;  Location: [place] CV LAB;  Service: Cardiovascular;  Laterality: Right;   ARTERY REPAIR Right 02/17/2018   Procedure: EXPLORATION OF RIGHT BRACHIAL ARTERY;  Surgeon: Conrad Bad Axe, MD;  Location: [place];  Service: Vascular;  Laterality: Right;   ARTERY REPAIR Right 02/17/2018   Procedure: BRACHIAL ARTERY EXPLORATION AND TRHOMBECTOMY;  Surgeon: Conrad [place], MD;  Location: [place];  Service: Vascular;  Laterality: Right;   AV FISTULA PLACEMENT Left 05/09/2017   Procedure: INSERTION OF ARTERIOVENOUS (AV) GRAFT ARM (ARTEGRAFT);  Surgeon: Conrad Delevan, MD;  Location: [place];  Service: Vascular;  Laterality: Left;   AV FISTULA PLACEMENT Right 02/17/2018   Procedure: INSERTION OF ARTERIOVENOUS (AV) GORE-TEX GRAFT ARM RIGHT UPPER ARM;  Surgeon: Conrad Redbird, MD;  Location: [place];  Service: Vascular;  Laterality: Right;   AV FISTULA PLACEMENT Left 10/10/2019   Procedure: INSERTION OF LEFT ARTERIOVENOUS (AV) ARTEGRAFT GRAFT ARM;  Surgeon: Waynetta Sandy, MD;  Location: [place];  Service: Vascular;  Laterality: Left;   AV FISTULA PLACEMENT Right 03/19/2020   Procedure: INSERTION OF ARTERIOVENOUS (AV) GORE-TEX GRAFT IN RIGHT ARM AND VIABAHN STENT IN RIGHT AXILLARY ARTERY;  Surgeon: Waynetta Sandy, MD;  Location: [place];  Service: Vascular;  Laterality: Right;   [employer] Left 08/31/2016   Procedure: BASILIC VEIN TRANSPOSITION FIRST STAGE;  Surgeon: Conrad [place], MD;  Location: [place];  Service: Vascular;  Laterality: Left;   BRONCHIAL WASHINGS  09/17/2021   Procedure: BRONCHIAL WASHINGS;  Surgeon: Collene Gobble, MD;  Location: [facility];  Service: Cardiopulmonary;;   CENTRAL VENOUS CATHETER INSERTION Left 07/24/2018   Procedure: INSERTION CENTRAL LINE ADULT;  Surgeon: Conrad , MD;  Location: [place];  Service: Vascular;  Laterality: Left;   EYE SURGERY     secondary to diabetic  retinopathy    FOOT SURGERY Right    t"ook bone out- maybe hammer toe"   INSERTION OF DIALYSIS CATHETER Left 07/24/2018   Procedure: INSERTION OF TUNNELED DIALYSIS CATHETER;  Surgeon: Conrad , MD;  Location: [place];  Service: Vascular;  Laterality: Left;   IR FLUORO GUIDE CV LINE RIGHT  07/21/2018   IR FLUORO GUIDE CV LINE RIGHT  06/01/2019   IR FLUORO GUIDE CV LINE RIGHT  05/05/2020   IR REMOVAL TUN CV CATH W/O FL  01/23/2021   IR US GUIDE VASC ACCESS RIGHT  07/21/2018   IR US GUIDE VASC ACCESS RIGHT  06/01/2019   LIGATION ARTERIOVENOUS GORTEX GRAFT Right 03/20/2020   Procedure: LIGATION ARM ARTERIOVENOUS GORTEX GRAFT With Patch Angioplasty, Thrombectomy;  Surgeon: Waynetta Sandy, MD;  Location: [place];  Service: Vascular;  Laterality: Right;   LIGATION OF ARTERIOVENOUS  FISTULA Left 05/09/2017   Procedure: LIGATION OF ARTERIOVENOUS  FISTULA;  Surgeon: Conrad , MD;  Location: [place];  Service: Vascular;  Laterality: Left;   LOOP RECORDER INSERTION N/A 12/05/2017   Procedure: LOOP RECORDER INSERTION;  Surgeon: Evans Lance, MD;  [facility]: [facility]  INVASIVE CV LAB;  Service: Cardiovascular;  Laterality: N/A;   MYOMECTOMY     REMOVAL OF GRAFT Right 02/17/2018   Procedure: REMOVAL OF RIGHT UPPER ARM ARTERIOVENOUS GRAFT;  Surgeon: Conrad [place], MD;  Location: [place];  Service: Vascular;  Laterality: Right;   REVISION OF ARTERIOVENOUS GORETEX GRAFT Left 07/24/2018   Procedure: REDO ARTERIOVENOUS GORETEX GRAFT;  Surgeon: Conrad [place], MD;  Location: [place];  Service: Vascular;  Laterality: Left;   TEE WITHOUT CARDIOVERSION N/A 12/05/2017   Procedure: TRANSESOPHAGEAL ECHOCARDIOGRAM (TEE);  Surgeon: Sanda Klein, MD;  Location: [place];  Service: Cardiovascular;  Laterality: N/A;   UPPER EXTREMITY VENOGRAPHY Left 07/13/2018   Procedure: UPPER EXTREMITY VENOGRAPHY - Central & Left Arm;  Surgeon: Conrad , MD;  Location: [place] CV LAB;  Service: Cardiovascular;  Laterality: Left;    UPPER EXTREMITY VENOGRAPHY Bilateral 09/10/2019   Procedure: UPPER EXTREMITY VENOGRAPHY;  Surgeon: Waynetta Sandy, MD;  Location: [place] CV LAB;  Service: Cardiovascular;  Laterality: Bilateral;   UTERINE FIBROID SURGERY     VIDEO BRONCHOSCOPY N/A 09/17/2021   Procedure: VIDEO BRONCHOSCOPY WITHOUT FLUORO;  Surgeon: Collene Gobble, MD;  Location: [facility];  Service: Cardiopulmonary;  Laterality: N/A;   VITRECTOMY Bilateral     Social History:  reports that she has never smoked. She has never used smokeless tobacco. She reports that she does not drink alcohol and does not use drugs.  Allergies  Allergen Reactions   Ibuprofen Other (See Comments)    CKD stage 3. Should avoid.   Dilaudid [Hydromorphone Hcl] Itching and Other (See Comments)    Can take with Benadryl.   Iodinated Contrast Media Rash   Tramadol Itching    Family History  Problem Relation Age of Onset   Colon polyps Mother    Diabetes Mother    Heart murmur Father        history essentially unknown   Hypertension Father    Diabetes Sister    Kidney failure Sister        kidney transplant   Diabetes Brother    Retinal degeneration Brother    Heart murmur Son    Stroke Paternal Grandmother    Diabetes Sister      Prior to Admission medications   Medication Sig Start Date End Date Taking? Authorizing Provider  calcium acetate (PHOSLO) 667 MG capsule Take 667 mg by mouth 3 (three) times daily with meals. 08/25/21  Yes [provider]  gabapentin (NEURONTIN) 300 MG capsule Take 1 capsule (300 mg total) by mouth 3 (three) times daily. Take after dialysis. Do not take more than three times weekly. 09/17/21  Yes Collene Gobble, MD  insulin aspart (NOVOLOG FLEXPEN) 100 UNIT/ML FlexPen Inject 6 Units into the skin 3 (three) times daily with meals. Sliding scale 07/16/20  Yes [name], MD  insulin degludec (TRESIBA FLEXTOUCH) 100 UNIT/ML SOPN FlexTouch Pen Inject 0.26 mLs (26 Units total)  into the skin at bedtime. 12/26/19  Yes Shamleffer, Melanie Crazier, MD  midodrine (PROAMATINE) 5 MG tablet Take 1 tablet (5 mg total) by mouth See admin instructions. Given at Dialysis if needed 09/17/21  Yes Byrum, Rose Fillers, MD  Continuous Blood Gluc Sensor (FREESTYLE LIBRE 14 DAY SENSOR) MISC 1 patch by Does not apply route every 14 (fourteen) days. 07/10/21   [name], MD  doxycycline (VIBRA-TABS) 100 MG tablet Take 1 tablet (100 mg total) by mouth 2 (two) times daily. Patient not taking: Reported on  12/18/2021 10/28/21   Inda Coke, PA  Insulin Pen Needle 29G X 5MM MISC 1 Device by Does not apply route 4 (four) times daily. 12/26/19   Shamleffer, Melanie Crazier, MD  metroNIDAZOLE (METROGEL) 0.75 % vaginal gel Insert applicatorful hs x 5 nights. Patient not taking: Reported on 12/18/2021 10/28/21   Salvadore Dom, MD  pantoprazole (PROTONIX) 40 MG tablet Take 1 tablet (40 mg total) by mouth daily. Patient not taking: Reported on 12/18/2021 11/05/21   Autry-Lott, Naaman Plummer, DO  predniSONE (DELTASONE) 50 MG tablet One tablet 13 hours, 7 hours and 1 hour prior to procedure. Patient not taking: Reported on 12/18/2021 11/25/21   Theresa Duty, NP    Physical Exam: Vitals:   12/18/21 1319 12/18/21 1449 12/18/21 1616 12/18/21 1800  BP: (!) 202/105 (!) 197/104 (!) 224/112 (!) 236/117  Pulse: 84 81 81 83  Resp: 20 16 14 15   Temp:      TempSrc:      SpO2: 100% 98% 100% 100%   Constitutional: Resting supine in bed, NAD, calm, comfortable Eyes: PERRL, lids and conjunctivae normal ENMT: Mucous membranes are moist. Posterior pharynx clear of any exudate or lesions.Normal dentition.  Neck: normal, supple, no masses. Respiratory: clear to auscultation bilaterally, no wheezing, no crackles. Normal respiratory effort. No accessory muscle use.  Cardiovascular: Regular rate and rhythm, no murmurs / rubs / gallops.  Trace left extremity edema.  Catheter in the left chest wall is coming  out with tip still in chest. Abdomen: no tenderness, no masses palpated. No hepatosplenomegaly. Bowel sounds positive.  Musculoskeletal: S/p right BKA.  No clubbing / cyanosis. No joint deformity upper and lower extremities. Good ROM, no contractures. Normal muscle tone.  Skin: no rashes, lesions, ulcers. No induration Neurologic: CN 2-12 grossly intact. Sensation intact. Strength 5/5 in all 4.  Psychiatric: Normal judgment and insight. Alert and oriented x 3. Normal mood.   Labs on Admission: I have personally reviewed following labs and imaging studies  CBC: Recent Labs  Lab 12/18/21 1203  WBC 6.8  HGB 9.9*  HCT 34.9*  MCV 107.1*  PLT PLATELET CLUMPS NOTED ON SMEAR, UNABLE TO ESTIMATE   Basic Metabolic Panel: Recent Labs  Lab 12/18/21 1203  NA 136  K 6.0*  CL 102  CO2 20*  GLUCOSE 101*  BUN 47*  CREATININE 12.69*  CALCIUM 8.8*   GFR: CrCl cannot be calculated (Unknown ideal weight.). Liver Function Tests: No results for input(s): AST, ALT, ALKPHOS, BILITOT, PROT, ALBUMIN in the last 168 hours. No results for input(s): LIPASE, AMYLASE in the last 168 hours. No results for input(s): AMMONIA in the last 168 hours. Coagulation Profile: No results for input(s): INR, PROTIME in the last 168 hours. Cardiac Enzymes: No results for input(s): CKTOTAL, CKMB, CKMBINDEX, TROPONINI in the last 168 hours. BNP (last 3 results) No results for input(s): PROBNP in the last 8760 hours. HbA1C: No results for input(s): HGBA1C in the last 72 hours. CBG: No results for input(s): GLUCAP in the last 168 hours. Lipid Profile: No results for input(s): CHOL, HDL, LDLCALC, TRIG, CHOLHDL, LDLDIRECT in the last 72 hours. Thyroid Function Tests: No results for input(s): TSH, T4TOTAL, FREET4, T3FREE, THYROIDAB in the last 72 hours. Anemia Panel: No results for input(s): VITAMINB12, FOLATE, FERRITIN, TIBC, IRON, RETICCTPCT in the last 72 hours. Urine analysis:    Component Value Date/Time    COLORURINE YELLOW 10/10/2019 0754   APPEARANCEUR TURBID (A) 10/10/2019 0754   LABSPEC 1.014 10/10/2019 [place]  5.0 10/10/2019 0754   GLUCOSEU NEGATIVE 10/10/2019 0754   HGBUR MODERATE (A) 10/10/2019 0754   BILIRUBINUR Positive 02/01/2020 1507   BILIRUBINUR neg 08/08/2013 0000   KETONESUR NEGATIVE 10/10/2019 0754   PROTEINUR Positive (A) 02/01/2020 1507   PROTEINUR >=300 (A) 10/10/2019 0754   UROBILINOGEN 0.2 02/01/2020 1507   UROBILINOGEN 0.2 09/07/2015 1230   NITRITE Negative 02/01/2020 1507   NITRITE NEGATIVE 10/10/2019 0754   LEUKOCYTESUR Trace (A) 02/01/2020 1507   LEUKOCYTESUR LARGE (A) 10/10/2019 0754    Radiological Exams on Admission: DG Chest 2 View  Result Date: 12/18/2021 CLINICAL DATA:  Dialysis catheter coming out EXAM: CHEST - 2 VIEW COMPARISON:  12/17/2021 FINDINGS: The left dialysis catheter has retracted further with the tip projected over the left clavicle, likely extravascular. Heart is borderline in size. Lungs clear. No effusions. No acute bony abnormality. Old left rib fractures. IMPRESSION: Left dialysis catheter has retracted further with the tip likely extravascular in the left upper chest wall. Electronically Signed   By: Rolm Baptise M.D.   On: 12/18/2021 12:39   DG Chest 2 View  Result Date: 12/17/2021 CLINICAL DATA:  Dialysis catheter partially out EXAM: CHEST - 2 VIEW COMPARISON:  09/25/2021 FINDINGS: Left dialysis catheter has pulled back. The tip is in the left upper chest. It is difficult to determine if this is chest into the left internal jugular vein or outside the vein in the left upper chest wall. Cardiomegaly. No confluent airspace opacities, effusions or edema. IMPRESSION: Tip of the left dialysis catheter is in the left upper chest and could be outside the vein or just into the left internal jugular vein. No acute cardiopulmonary disease. Electronically Signed   By: Rolm Baptise M.D.   On: 12/17/2021 10:48    EKG: Ordered and  pending.  Assessment/Plan Principal Problem:   Displacement of vascular dialysis catheter, initial encounter ([place]) Active Problems:   Diabetes mellitus, type II, insulin dependent ([place]), on CGM, with end organ damage: renal, neuropathy, gastroparesis, retinopathy   Anemia in chronic kidney disease, on chronic dialysis ([place])   Hypertensive urgency   Hyperkalemia   ESRD on dialysis ([place])   Heather [name] is a 50 y.o. female with medical history significant for ESRD on TTS HD via HD cath, history of CVA, insulin-dependent T2DM, HTN, Charcot foot s/p right BKA, history of DVT not on anticoagulation, anemia of chronic kidney disease who is admitted for management of displaced dialysis catheter in setting of ESRD associated with hyperkalemia and hypertensive urgency.  ESRD on TTS HD with displaced dialysis catheter: Nephrology following, consult to IR order for [facility] replacement.  Plan for dialysis after [facility] placement 12/24.  Hyperkalemia: Started on Lokelma 10 g 3 times daily until HD can be started.  Hypertensive urgency: Largely asymptomatic.  Give clonidine 0.2 mg once now.  Use hydralazine as needed.  [name] not on antihypertensives as an outpatient.  Insulin-dependent type 2 diabetes: Place on very sensitive SSI.  Anemia of chronic kidney disease: Hemoglobin stable without obvious bleeding.  History of CVA: Not currently on antiplatelet or statin therapy.  DVT prophylaxis: SCDs for now pending [facility] placement Code Status: Full code, confirmed with patient on admission Family Communication: Discussed with patient, she has discussed with family Disposition Plan: From home and likely discharge to home pending clinical progress Consults called: Nephrology, IR Level of care: Progressive Admission status:  Status is: Observation  The patient remains OBS appropriate and will d/c before 2 midnights.  Zada Finders MD  [facility]  If 7PM-7AM, please contact  night-coverage [email or web address]  12/18/2021, 6:39 PM

## 2021-12-18 NOTE — Consult Note (Signed)
ESRD Consult Note  Assessment/Recommendations:  # ESRD:  -outpatient orders: [place], TTS, 3.75 HRS, 400/700, 2K/2CAL, TDC, EDW 95KG, UF PROFILE 4. Heparin 7k bolus, Mircera 218mcg q2wks (last dose 12/13) -HD tomorrow once catheter has been replaced  # Dialysis catheter malfntn -IR called, needs consult, tentative plan for [facility] replacement tomorrow, NPO after midnight  # Hyperkalemia -lokelma 10g until she is on HD then can stop. Renal diet  # Volume/ hypertension: EDW 95kg. Attempt to achieve EDW as tolerated. Resume home meds  # Anemia of Chronic Kidney Disease: Hemoglobin 9.9. Just received mircera this week, hold off on further doses  # Secondary Hyperparathyroidism/Hyperphosphatemia: resume home binders   # Additional recommendations: - Dose all meds for creatinine clearance < 10 ml/min  - Unless absolutely necessary, no MRIs with gadolinium.  - Implement save arm precautions.  Prefer needle sticks in the dorsum of the hands or wrists.  No blood pressure measurements in arm. - If blood transfusion is requested during hemodialysis sessions, please alert [name] prior to the session.  - If a hemodialysis catheter line culture is requested, please alert [name] as only hemodialysis nurses are able to collect those specimens.   Gean Quint, MD [facility]  History of Present Illness: Heather [name] is a/an 50 y.o. female with a past medical history of ESRD, DM, HTN, h/o CVA who presents with dialysis access malfunction. Catheter coming out, was not able to run on HD as an outpatient hence sent here. IR was contacted by ER MD, will need a consult to be placed once admitted and there is a tentative plan for [facility] replacement tomorrow. Found to be hyperkalemic with a K of 6 here, receiving lokelma. She does endorse some LLE swelling but otherwise no other complaints.  Medications:  Current Facility-Administered Medications  Medication Dose Route Frequency Provider Last Rate  Last Admin   hydrALAZINE (APRESOLINE) injection 20 mg  20 mg Intravenous STAT Noemi Chapel, MD       sodium zirconium cyclosilicate (LOKELMA) packet 10 g  10 g Oral TID Noemi Chapel, MD       Current Outpatient Medications  Medication Sig Dispense Refill   calcium acetate (PHOSLO) 667 MG capsule Take 667 mg by mouth 3 (three) times daily with meals.     gabapentin (NEURONTIN) 300 MG capsule Take 1 capsule (300 mg total) by mouth 3 (three) times daily. Take after dialysis. Do not take more than three times weekly.     insulin aspart (NOVOLOG FLEXPEN) 100 UNIT/ML FlexPen Inject 6 Units into the skin 3 (three) times daily with meals. Sliding scale     insulin degludec (TRESIBA FLEXTOUCH) 100 UNIT/ML SOPN FlexTouch Pen Inject 0.26 mLs (26 Units total) into the skin at bedtime. 24 mL 3   midodrine (PROAMATINE) 5 MG tablet Take 1 tablet (5 mg total) by mouth See admin instructions. Given at Dialysis if needed     Continuous Blood Gluc Sensor (FREESTYLE LIBRE 14 DAY SENSOR) MISC 1 patch by Does not apply route every 14 (fourteen) days. 6 each 5   doxycycline (VIBRA-TABS) 100 MG tablet Take 1 tablet (100 mg total) by mouth 2 (two) times daily. 20 tablet 0   Insulin Pen Needle 29G X 5MM MISC 1 Device by Does not apply route 4 (four) times daily. 400 each 3   metroNIDAZOLE (METROGEL) 0.75 % vaginal gel Insert applicatorful hs x 5 nights. 70 g 0   pantoprazole (PROTONIX) 40 MG tablet Take 1 tablet (40 mg total)  by mouth daily. 30 tablet 0   predniSONE (DELTASONE) 50 MG tablet One tablet 13 hours, 7 hours and 1 hour prior to procedure. 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[place]),  nonhemorrhagic, inferior right cerebellum 12/03/2017   left side weakness in leg   Cyst of left ovary 01/12/2018   Diabetic neuropathy, painful ([place]), on low dose Gabapentin 07/13/2013   DM (diabetes mellitus), type 2, uncontrolled, with renal complications 79/39/0300   DM2 (diabetes mellitus, type 2) ([place])    Dysfunction of left eustachian tube, with pusatile tinnitus 11/24/2017   Dyslipidemia associated with type 2 diabetes mellitus ([place]) 06/25/2016   ESRD (end stage renal disease) on dialysis [facility])    On hemodialysis in July 2019 via [facility] then switched to [facility] in Nov 2019.    ESRD with anemia ([facility])    Fibromyalgia    Gastroparesis due to DM    GERD (gastroesophageal reflux disease)    Headache    Hypertension associated with diabetes ([place]) 02/19/2011   IBS (irritable bowel syndrome)    Left-sided weakness 12/10/2017   .   Leiomyoma of uterus    Pancreatitis    PE (pulmonary thromboembolism) ([place]) 11/04/2019   Pneumonia    Proliferative diabetic retinopathy ([place]) 09/29/2015   Pronation deformity of both feet 06/14/2014   Reactive depression 12/10/2017   .   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[place]) 02/17/2018   Thromboembolism ([place]) 04/05/2018   Upper airway cough syndrome, with recs to stay off ACE and take Pepcid q hs 11/15/2016     SOCIAL HISTORY Social History   Socioeconomic History   Marital status: Married    Spouse name: Not on file   Number of children: 1   Years of education: college   Highest education level: Not on file  Occupational History   Occupation: customer service rep    Employer: [employer]  Tobacco Use   Smoking status: Never   Smokeless tobacco: Never  Vaping Use   Vaping Use: Never used  Substance and Sexual Activity   Alcohol use: No    Alcohol/week: 0.0 standard drinks   Drug use: No   Sexual activity: Not Currently    Partners: Male     Birth control/protection: I.U.D.  Other Topics Concern   Not on file  Social History Narrative   Patient lives with fiance. She has 1 grown son. Works full-time, she Paramedic for attorney.   Caffeine Use: 2 cups daily; sodas occasionally   She has 7 grandchildren   Social Determinants of Radio broadcast assistant Strain: Not on file  Food Insecurity: Not on file  Transportation Needs: Not on file  Physical Activity: Not on file  Stress: Not on file  Social Connections: Not on file  Intimate Partner Violence: Not on file     FAMILY HISTORY Family History  Problem Relation Age of Onset   Colon polyps Mother    Diabetes Mother    Heart murmur Father        history essentially unknown   Hypertension Father    Diabetes Sister  Kidney failure Sister        kidney transplant   Diabetes Brother    Retinal degeneration Brother    Heart murmur Son    Stroke Paternal Grandmother    Diabetes Sister      Review of Systems: 12 systems were reviewed and negative except per HPI  Physical Exam: Vitals:   12/18/21 1449 12/18/21 1616  BP: (!) 197/104 (!) 224/112  Pulse: 81 81  Resp: 16 14  Temp:    SpO2: 98% 100%   No intake/output data recorded. No intake or output data in the 24 hours ending 12/18/21 1751 General: well-appearing, no acute distress, laying flat in bed HEENT: anicteric sclera, MMM CV: normal rate, no murmurs, no edema Lungs: bilateral chest rise, normal wob Abd: obese, soft, non-tender, non-distended Skin: no visible lesions or rashes Ext: rt bka, LLE no sig edema Psych: alert, engaged, appropriate mood and affect Neuro: normal speech, no gross focal deficits  Dialysis access: [facility] dislodged  Test Results Reviewed Lab Results  Component Value Date   NA 136 12/18/2021   K 6.0 (H) 12/18/2021   CL 102 12/18/2021   CO2 20 (L) 12/18/2021   BUN 47 (H) 12/18/2021   CREATININE 12.69 (H) 12/18/2021   GFR 10.05 (LL) 06/05/2018   GLU 221  03/20/2019   CALCIUM 8.8 (L) 12/18/2021   ALBUMIN 4.0 11/05/2021   PHOS 5.5 (H) 05/07/2020    I have reviewed relevant outside healthcare records

## 2021-12-18 NOTE — ED Provider Notes (Signed)
[facility] EMERGENCY DEPARTMENT Provider Note   CSN: [id number] Arrival date & time: 12/18/21  1045     History No chief complaint on file.   Heather [name] is a 50 y.o. female.  HPI  This patient is a 50 year old female, she has multiple medical problems including, end-stage renal disease on dialysis, she has a prior history of a stroke, prior history of diabetes, she also has a history of pulmonary embolism, she is currently taking medications including gabapentin, midodrine, insulin, she does not take any anticoagulants.  She states that she noticed yesterday that her dialysis catheter was coming out of place while she was at dialysis.  She did not officially get it yesterday and has not had dialysis in over 3 days.  She has no other symptoms other than some very gradual mild weakness.  She presents today to have her catheter replaced, she has no shortness of breath fever or significant swelling in the legs.  Her catheter is gradually worsening and does have some discomfort at the catheter site.  Nothing seems to make this better, it is worse with palpation.  No medications given prior to arrival.  Did not get dialysis today - she left without being seen by IR yesterday.   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[place]), nonhemorrhagic, inferior right cerebellum 12/03/2017   left side weakness in leg   Cyst of left ovary 01/12/2018   Diabetic neuropathy, painful ([place]), on low dose Gabapentin 07/13/2013   DM (diabetes mellitus), type 2, uncontrolled, with renal complications 75/64/3329   DM2 (diabetes mellitus, type 2) ([place])    Dysfunction of left eustachian tube, with pusatile tinnitus 11/24/2017   Dyslipidemia associated with type 2 diabetes mellitus ([place])  06/25/2016   ESRD (end stage renal disease) on dialysis [facility])    On hemodialysis in July 2019 via [facility] then switched to [facility] in Nov 2019.    ESRD with anemia (HCC)    Fibromyalgia    Gastroparesis due to DM    GERD (gastroesophageal reflux disease)    Headache    Hypertension associated with diabetes ([place]) 02/19/2011   IBS (irritable bowel syndrome)    Left-sided weakness 12/10/2017   .   Leiomyoma of uterus    Pancreatitis    PE (pulmonary thromboembolism) ([place]) 11/04/2019   Pneumonia    Proliferative diabetic retinopathy ([place]) 09/29/2015   Pronation deformity of both feet 06/14/2014   Reactive depression 12/10/2017   .   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[place]) 02/17/2018   Thromboembolism ([place]) 04/05/2018   Upper airway cough syndrome, with recs to stay off ACE and take Pepcid q hs 11/15/2016    Patient Active Problem List   Diagnosis Date Noted   S/P BKA (below knee amputation), right ([place]) 03/31/2021   Impaired mobility and ADLs 03/25/2021   CVA (cerebral vascular accident) ([place]) 03/04/2021   Pulmonary embolus ([place]) 03/04/2021   Charcot ankle, right 02/23/2021   Hx of cardiac arrest 11/12/2020   Abnormal CT of the chest 09/26/2020   Allergy, unspecified, initial encounter 09/22/2020   Anaphylactic shock, unspecified, initial encounter 09/22/2020   Fluid overload, unspecified 06/10/2020   Other mechanical complication of other cardiac and vascular devices and implants,  initial encounter ([place]) 05/10/2020   Positive ANA (antinuclear antibody) 04/16/2020   ESRD on dialysis [facility])    Hemiplegia and hemiparesis following cerebral infarction affecting left non-dominant side ([place]) 12/29/2018   IBS (irritable bowel syndrome) 08/06/2017   Dyslipidemia associated with type 2 diabetes mellitus ([place]) 06/25/2016   Proliferative diabetic retinopathy ([place]) 09/29/2015   Chronic cough  10/04/2014   B12 deficiency 05/10/2014   Anemia in chronic kidney disease 04/26/2014   CKD (chronic kidney disease) stage 5, GFR less than 15 ml/min (HCC) 04/26/2014   Secondary hyperparathyroidism of renal origin ([place]) 11/27/2013   Posterior subcapsular cataract, bilateral 10/18/2013   Diabetic neuropathy, painful ([place]) 07/13/2013   Diabetes mellitus, type II, insulin dependent ([place]), on CGM, with end organ damage: renal, neuropathy, gastroparesis, retinopathy 02/19/2011   Hypertension associated with diabetes ([place]) 02/19/2011   Gastroparesis due to DM ([place]) 02/19/2011    Past Surgical History:  Procedure Laterality Date   A/V FISTULAGRAM Right 03/03/2020   Procedure: Venogram;  Surgeon: Waynetta Sandy, MD;  Location: [place] CV LAB;  Service: Cardiovascular;  Laterality: Right;   ARTERY REPAIR Right 02/17/2018   Procedure: EXPLORATION OF RIGHT BRACHIAL ARTERY;  Surgeon: Conrad Eleele, MD;  Location: [place];  Service: Vascular;  Laterality: Right;   ARTERY REPAIR Right 02/17/2018   Procedure: BRACHIAL ARTERY EXPLORATION AND TRHOMBECTOMY;  Surgeon: Conrad Excello, MD;  Location: [place];  Service: Vascular;  Laterality: Right;   AV FISTULA PLACEMENT Left 05/09/2017   Procedure: INSERTION OF ARTERIOVENOUS (AV) GRAFT ARM (ARTEGRAFT);  Surgeon: Conrad Knox, MD;  Location: [place];  Service: Vascular;  Laterality: Left;   AV FISTULA PLACEMENT Right 02/17/2018   Procedure: INSERTION OF ARTERIOVENOUS (AV) GORE-TEX GRAFT ARM RIGHT UPPER ARM;  Surgeon: Conrad Eden, MD;  Location: [place];  Service: Vascular;  Laterality: Right;   AV FISTULA PLACEMENT Left 10/10/2019   Procedure: INSERTION OF LEFT ARTERIOVENOUS (AV) ARTEGRAFT GRAFT ARM;  Surgeon: Waynetta Sandy, MD;  Location: [place];  Service: Vascular;  Laterality: Left;   AV FISTULA PLACEMENT Right 03/19/2020   Procedure: INSERTION OF ARTERIOVENOUS (AV) GORE-TEX GRAFT IN RIGHT ARM AND VIABAHN STENT IN RIGHT AXILLARY ARTERY;   Surgeon: Waynetta Sandy, MD;  Location: [place];  Service: Vascular;  Laterality: Right;   [place] Left 08/31/2016   Procedure: BASILIC VEIN TRANSPOSITION FIRST STAGE;  Surgeon: Conrad [place], MD;  Location: [place];  Service: Vascular;  Laterality: Left;   BRONCHIAL WASHINGS  09/17/2021   Procedure: BRONCHIAL WASHINGS;  Surgeon: Collene Gobble, MD;  [facility];  Service: Cardiopulmonary;;   CENTRAL VENOUS CATHETER INSERTION Left 07/24/2018   Procedure: INSERTION CENTRAL LINE ADULT;  Surgeon: Conrad [place], MD;  Location: [place];  Service: Vascular;  Laterality: Left;   EYE SURGERY     secondary to diabetic retinopathy    FOOT SURGERY Right    t"ook bone out- maybe hammer toe"   INSERTION OF DIALYSIS CATHETER Left 07/24/2018   Procedure: INSERTION OF TUNNELED DIALYSIS CATHETER;  Surgeon: Conrad [place], MD;  Location: [place];  Service: Vascular;  Laterality: Left;   IR FLUORO GUIDE CV LINE RIGHT  07/21/2018   IR FLUORO GUIDE CV LINE RIGHT  06/01/2019   IR FLUORO GUIDE CV LINE RIGHT  05/05/2020   IR REMOVAL TUN CV CATH W/O FL  01/23/2021   IR US GUIDE VASC ACCESS RIGHT  07/21/2018   IR US GUIDE VASC ACCESS RIGHT  06/01/2019  LIGATION ARTERIOVENOUS GORTEX GRAFT Right 03/20/2020   Procedure: LIGATION ARM ARTERIOVENOUS GORTEX GRAFT With Patch Angioplasty, Thrombectomy;  Surgeon: Waynetta Sandy, MD;  Location: [facility] [place];  Service: Vascular;  Laterality: Right;   LIGATION OF ARTERIOVENOUS  FISTULA Left 05/09/2017   Procedure: LIGATION OF ARTERIOVENOUS  FISTULA;  Surgeon: Conrad [place], MD;  Location: [place];  Service: Vascular;  Laterality: Left;   LOOP RECORDER INSERTION N/A 12/05/2017   Procedure: LOOP RECORDER INSERTION;  Surgeon: Evans Lance, MD;  Location: [place] CV LAB;  Service: Cardiovascular;  Laterality: N/A;   MYOMECTOMY     REMOVAL OF GRAFT Right 02/17/2018   Procedure: REMOVAL OF RIGHT UPPER ARM ARTERIOVENOUS GRAFT;  Surgeon: Conrad [place], MD;   Location: [facility] [place];  Service: Vascular;  Laterality: Right;   REVISION OF ARTERIOVENOUS GORETEX GRAFT Left 07/24/2018   Procedure: REDO ARTERIOVENOUS GORETEX GRAFT;  Surgeon: Conrad [place], MD;  Location: [place];  Service: Vascular;  Laterality: Left;   TEE WITHOUT CARDIOVERSION N/A 12/05/2017   Procedure: TRANSESOPHAGEAL ECHOCARDIOGRAM (TEE);  Surgeon: Sanda Klein, MD;  Location: [facility];  Service: Cardiovascular;  Laterality: N/A;   UPPER EXTREMITY VENOGRAPHY Left 07/13/2018   Procedure: UPPER EXTREMITY VENOGRAPHY - Central & Left Arm;  Surgeon: Conrad [place], MD;  Location: [place] CV LAB;  Service: Cardiovascular;  Laterality: Left;   UPPER EXTREMITY VENOGRAPHY Bilateral 09/10/2019   Procedure: UPPER EXTREMITY VENOGRAPHY;  Surgeon: Waynetta Sandy, MD;  Location: [place] CV LAB;  Service: Cardiovascular;  Laterality: Bilateral;   UTERINE FIBROID SURGERY     VIDEO BRONCHOSCOPY N/A 09/17/2021   Procedure: VIDEO BRONCHOSCOPY WITHOUT FLUORO;  Surgeon: Collene Gobble, MD;  [facility];  Service: Cardiopulmonary;  Laterality: N/A;   VITRECTOMY Bilateral      OB History     Gravida  2   Para  1   Term      Preterm      AB  1   Living  1      SAB  1   IAB      Ectopic      Multiple      Live Births              Family History  Problem Relation Age of Onset   Colon polyps Mother    Diabetes Mother    Heart murmur Father        history essentially unknown   Hypertension Father    Diabetes Sister    Kidney failure Sister        kidney transplant   Diabetes Brother    Retinal degeneration Brother    Heart murmur Son    Stroke Paternal Grandmother    Diabetes Sister     Social History   Tobacco Use   Smoking status: Never   Smokeless tobacco: Never  Vaping Use   Vaping Use: Never used  Substance Use Topics   Alcohol use: No    Alcohol/week: 0.0 standard drinks   Drug use: No    Home Medications Prior to Admission  medications   Medication Sig Start Date End Date Taking? Authorizing Provider  calcium acetate (PHOSLO) 667 MG capsule Take 667 mg by mouth 3 (three) times daily with meals. 08/25/21  Yes [provider]  gabapentin (NEURONTIN) 300 MG capsule Take 1 capsule (300 mg total) by mouth 3 (three) times daily. Take after dialysis. Do not take more than three times weekly. 09/17/21  Yes  Collene Gobble, MD  insulin aspart (NOVOLOG FLEXPEN) 100 UNIT/ML FlexPen Inject 6 Units into the skin 3 (three) times daily with meals. Sliding scale 07/16/20  Yes [name], MD  insulin degludec (TRESIBA FLEXTOUCH) 100 UNIT/ML SOPN FlexTouch Pen Inject 0.26 mLs (26 Units total) into the skin at bedtime. 12/26/19  Yes Shamleffer, Melanie Crazier, MD  midodrine (PROAMATINE) 5 MG tablet Take 1 tablet (5 mg total) by mouth See admin instructions. Given at Dialysis if needed 09/17/21  Yes Byrum, Rose Fillers, MD  Continuous Blood Gluc Sensor (FREESTYLE LIBRE 14 DAY SENSOR) MISC 1 patch by Does not apply route every 14 (fourteen) days. 07/10/21   [name], MD  doxycycline (VIBRA-TABS) 100 MG tablet Take 1 tablet (100 mg total) by mouth 2 (two) times daily. 10/28/21   Inda Coke, PA  Insulin Pen Needle 29G X 5MM MISC 1 Device by Does not apply route 4 (four) times daily. 12/26/19   Shamleffer, Melanie Crazier, MD  metroNIDAZOLE (METROGEL) 0.75 % vaginal gel Insert applicatorful hs x 5 nights. 10/28/21   Salvadore Dom, MD  pantoprazole (PROTONIX) 40 MG tablet Take 1 tablet (40 mg total) by mouth daily. 11/05/21   Autry-Lott, Naaman Plummer, DO  predniSONE (DELTASONE) 50 MG tablet One tablet 13 hours, 7 hours and 1 hour prior to procedure. 11/25/21   Theresa Duty, NP    Allergies    Ibuprofen, Dilaudid [hydromorphone hcl], Iodinated contrast media, and Tramadol  Review of Systems   Review of Systems  All other systems reviewed and are negative.  Physical Exam Updated Vital Signs BP (!) 224/112    Pulse  81    Temp 98.5 F (36.9 C) (Oral)    Resp 14    LMP  (LMP Unknown)    SpO2 100%   Physical Exam Vitals and nursing note reviewed.  Constitutional:      General: [name] not in acute distress.    Appearance: [name] well-developed.  HENT:     Head: Normocephalic and atraumatic.     Mouth/Throat:     Pharynx: No oropharyngeal exudate.  Eyes:     General: No scleral icterus.       Right eye: No discharge.        Left eye: No discharge.     Conjunctiva/sclera: Conjunctivae normal.     Pupils: Pupils are equal, round, and reactive to light.  Neck:     Thyroid: No thyromegaly.     Vascular: No JVD.  Cardiovascular:     Rate and Rhythm: Normal rate and regular rhythm.     Heart sounds: Normal heart sounds. No murmur heard.   No friction rub. No gallop.     Comments: Catheter in the left chest - is coming out.  Still in the chest (tip), no bleeding no drainage   Pulmonary:     Effort: Pulmonary effort is normal. No respiratory distress.     Breath sounds: Normal breath sounds. No wheezing or rales.  Abdominal:     General: Bowel sounds are normal. There is no distension.     Palpations: Abdomen is soft. There is no mass.     Tenderness: There is no abdominal tenderness.  Musculoskeletal:        General: No tenderness. Normal range of motion.     Cervical back: Normal range of motion and neck supple.  Lymphadenopathy:     Cervical: No cervical adenopathy.  Skin:    General: Skin is warm and  dry.     Findings: No erythema or rash.  Neurological:     Mental Status: [name] alert.     Coordination: Coordination normal.  Psychiatric:        Behavior: Behavior normal.    ED Results / Procedures / Treatments   Labs (all labs ordered are listed, but only abnormal results are displayed) Labs Reviewed  CBC - Abnormal; Notable for the following components:      Result Value   RBC 3.26 (*)    Hemoglobin 9.9 (*)    HCT 34.9 (*)    MCV 107.1 (*)    MCHC 28.4 (*)    RDW 16.1 (*)     nRBC 0.6 (*)    All other components within normal limits  BASIC METABOLIC PANEL - Abnormal; Notable for the following components:   Potassium 6.0 (*)    CO2 20 (*)    Glucose, Bld 101 (*)    BUN 47 (*)    Creatinine, Ser 12.69 (*)    Calcium 8.8 (*)    GFR, Estimated 3 (*)    All other components within normal limits    EKG None  Radiology DG Chest 2 View  Result Date: 12/18/2021 CLINICAL DATA:  Dialysis catheter coming out EXAM: CHEST - 2 VIEW COMPARISON:  12/17/2021 FINDINGS: The left dialysis catheter has retracted further with the tip projected over the left clavicle, likely extravascular. Heart is borderline in size. Lungs clear. No effusions. No acute bony abnormality. Old left rib fractures. IMPRESSION: Left dialysis catheter has retracted further with the tip likely extravascular in the left upper chest wall. Electronically Signed   By: Rolm Baptise M.D.   On: 12/18/2021 12:39   DG Chest 2 View  Result Date: 12/17/2021 CLINICAL DATA:  Dialysis catheter partially out EXAM: CHEST - 2 VIEW COMPARISON:  09/25/2021 FINDINGS: Left dialysis catheter has pulled back. The tip is in the left upper chest. It is difficult to determine if this is chest into the left internal jugular vein or outside the vein in the left upper chest wall. Cardiomegaly. No confluent airspace opacities, effusions or edema. IMPRESSION: Tip of the left dialysis catheter is in the left upper chest and could be outside the vein or just into the left internal jugular vein. No acute cardiopulmonary disease. Electronically Signed   By: Rolm Baptise M.D.   On: 12/17/2021 10:48    Procedures .Critical Care Performed by: Noemi Chapel, MD Authorized by: Noemi Chapel, MD   Critical care provider statement:    Critical care time (minutes):  45   Critical care time was exclusive of:  Separately billable procedures and treating other patients   Critical care was necessary to treat or prevent imminent or  life-threatening deterioration of the following conditions:  Renal failure and metabolic crisis (Severe hypertension and hyperkalemia)   Critical care was time spent personally by me on the following activities:  Development of treatment plan with patient or surrogate, discussions with consultants, evaluation of patient's response to treatment, examination of patient, obtaining history from patient or surrogate, review of old charts, re-evaluation of patient's condition, pulse oximetry, ordering and review of radiographic studies, ordering and review of laboratory studies and ordering and performing treatments and interventions   Care discussed with: admitting provider   Comments:         Medications Ordered in ED Medications  sodium zirconium cyclosilicate (LOKELMA) packet 10 g (has no administration in time range)  hydrALAZINE (APRESOLINE) injection 20 mg (  has no administration in time range)    ED Course  I have reviewed the triage vital signs and the nursing notes.  Pertinent labs & imaging results that were available during my care of the patient were reviewed by me and considered in my medical decision making (see chart for details).    MDM Rules/Calculators/A&P                          This patient presents to the ED for concern of chest pain and catheter dysfunction, this involves an extensive number of treatment options, and is a complaint that carries with it a high risk of complications and morbidity.  The differential diagnosis includes removal of the catheter, nonpatent catheter, infection surrounding the catheter, additionally the patient has hyperkalemia and is severely hypertensive requiring interventions including IV blood pressure medications, Lokelma, multiple consultations.   Additional history obtained:  Additional history obtained from the medical record at length including prior lab work, she was not hyperkalemic at her last visit External records from outside  source obtained and reviewed including no hyperkalemia, evidently catheter was [facility] at [facility]   Lab Tests:  I Ordered, reviewed, and interpreted labs.  The pertinent results include: Hyperkalemia, 6.0   Imaging Studies ordered:  I ordered imaging studies including chest x-ray I independently visualized and interpreted imaging which showed catheter tip likely extravascular I agree with the radiologist interpretation   Cardiac Monitoring:  The patient was maintained on a cardiac monitor.  I personally viewed and interpreted the cardiac monitored which showed an underlying rhythm of: Sinus rhythm   Medicines ordered and prescription drug management:  I ordered medication including Lokelma for hyperkalemia Reevaluation of the patient after these medicines showed that the patient stayed the same I have reviewed the patients home medicines and have made adjustments as needed   Critical Interventions:  Lokelma for hyperkalemia, interventional radiology consultation, nephrology consultation   Consultations Obtained:  I requested consultation with the nephrologist who will organize dialysis for tomorrow after catheter is replaced, discussed with the interventional radiologist on-call who states that she will not be seen today but if the admitting team put in the consultation for tomorrow they will see her tomorrow for catheter replacement,  and discussed lab and imaging findings as well as pertinent plan Hospitalist, for admission   Reevaluation:  After the interventions noted above, I reevaluated the patient and found that they have :stayed the same   Dispostion:  After consideration of the diagnostic results and the patients response to treatment feel that the patent would benefit from admission    Final Clinical Impression(s) / ED Diagnoses Final diagnoses:  Displacement of vascular dialysis catheter, initial encounter ([place])  Hyperkalemia  End stage renal disease ([place])   Primary hypertension     Noemi Chapel, MD 12/18/21 1752

## 2021-12-18 NOTE — ED Provider Notes (Signed)
Emergency Medicine Provider Triage Evaluation Note  Heather [name] , a 50 y.o. female  was evaluated in triage.  Pt complains of dialysis port coming out.  Patient was evaluated in this emergency room yesterday but refused to leave AMA prior to being evaluated by IR and had plans to come in today.  She reports since she has left she has been having bleeding from the port site.  Her last dialysis session was Tuesday.  She denies other complaints..  Review of Systems  Positive: Port issue, bleeding around the port site Negative: Denies fever, chills, shortness of breath  Physical Exam  BP (!) 195/99 (BP Location: Right Arm)    Pulse 86    Temp 98.5 F (36.9 C) (Oral)    Resp 17    LMP  (LMP Unknown)    SpO2 100%  Gen:   Awake, no distress   Resp:  Normal effort  MSK:   Moves extremities without difficulty  Other:    Medical Decision Making  Medically screening exam initiated at 11:58 AM.  Appropriate orders placed.  Heather [name] was informed that the remainder of the evaluation will be completed by another provider, this initial triage assessment does not replace that evaluation, and the importance of remaining in the ED until their evaluation is complete.     [name], PA-C 12/18/21 1201    [name], MD 12/18/21 [phone]

## 2021-12-18 NOTE — Progress Notes (Signed)
Pt was transferred from ED to 4E13 at 22:00. Alert and oriented x 4. Her left subclavian HD cath has old drain. Plan for dressing change tonight. Per MD noted for HD cath replacement at tomorrow. We will keep Pt NPO. At arrival, her BP 186/104- 171/77 mmHg. We have been waiting for an IV access for stat Hydralazine per MD order.  Pt got Catapres 0.2 mg PO in ED before transferring to 4E. Denied headache or blurred vision. Dr. Candiss Norse, the on-call nephrologist verified that Pt can have peripheral IV on right arm only. IV team was consulted.   CHG bath was given. CCMD was called and verified NSR on the cardiac monitoring. No acute distress. We will continue to monitor.  Kennyth Lose, RN

## 2021-12-19 ENCOUNTER — Inpatient Hospital Stay (HOSPITAL_COMMUNITY): Payer: Medicare Other

## 2021-12-19 DIAGNOSIS — Z841 Family history of disorders of kidney and ureter: Secondary | ICD-10-CM | POA: Diagnosis not present

## 2021-12-19 DIAGNOSIS — Z8249 Family history of ischemic heart disease and other diseases of the circulatory system: Secondary | ICD-10-CM | POA: Diagnosis not present

## 2021-12-19 DIAGNOSIS — Z992 Dependence on renal dialysis: Secondary | ICD-10-CM | POA: Diagnosis not present

## 2021-12-19 DIAGNOSIS — E875 Hyperkalemia: Secondary | ICD-10-CM

## 2021-12-19 DIAGNOSIS — I160 Hypertensive urgency: Secondary | ICD-10-CM

## 2021-12-19 DIAGNOSIS — N186 End stage renal disease: Secondary | ICD-10-CM | POA: Diagnosis present

## 2021-12-19 DIAGNOSIS — Y929 Unspecified place or not applicable: Secondary | ICD-10-CM | POA: Diagnosis not present

## 2021-12-19 DIAGNOSIS — D631 Anemia in chronic kidney disease: Secondary | ICD-10-CM | POA: Diagnosis not present

## 2021-12-19 DIAGNOSIS — Z823 Family history of stroke: Secondary | ICD-10-CM | POA: Diagnosis not present

## 2021-12-19 DIAGNOSIS — Z794 Long term (current) use of insulin: Secondary | ICD-10-CM | POA: Diagnosis not present

## 2021-12-19 DIAGNOSIS — E119 Type 2 diabetes mellitus without complications: Secondary | ICD-10-CM | POA: Diagnosis not present

## 2021-12-19 DIAGNOSIS — Z8673 Personal history of transient ischemic attack (TIA), and cerebral infarction without residual deficits: Secondary | ICD-10-CM | POA: Diagnosis not present

## 2021-12-19 DIAGNOSIS — Y838 Other surgical procedures as the cause of abnormal reaction of the patient, or of later complication, without mention of misadventure at the time of the procedure: Secondary | ICD-10-CM | POA: Diagnosis not present

## 2021-12-19 DIAGNOSIS — E669 Obesity, unspecified: Secondary | ICD-10-CM | POA: Diagnosis not present

## 2021-12-19 DIAGNOSIS — Z888 Allergy status to other drugs, medicaments and biological substances status: Secondary | ICD-10-CM | POA: Diagnosis not present

## 2021-12-19 DIAGNOSIS — Z89511 Acquired absence of right leg below knee: Secondary | ICD-10-CM | POA: Diagnosis not present

## 2021-12-19 DIAGNOSIS — Z79899 Other long term (current) drug therapy: Secondary | ICD-10-CM | POA: Diagnosis not present

## 2021-12-19 DIAGNOSIS — T8242XA Displacement of vascular dialysis catheter, initial encounter: Secondary | ICD-10-CM | POA: Diagnosis not present

## 2021-12-19 DIAGNOSIS — E11610 Type 2 diabetes mellitus with diabetic neuropathic arthropathy: Secondary | ICD-10-CM | POA: Diagnosis not present

## 2021-12-19 DIAGNOSIS — Z6833 Body mass index (BMI) 33.0-33.9, adult: Secondary | ICD-10-CM | POA: Diagnosis not present

## 2021-12-19 DIAGNOSIS — Z20822 Contact with and (suspected) exposure to covid-19: Secondary | ICD-10-CM | POA: Diagnosis not present

## 2021-12-19 DIAGNOSIS — E1122 Type 2 diabetes mellitus with diabetic chronic kidney disease: Secondary | ICD-10-CM | POA: Diagnosis not present

## 2021-12-19 DIAGNOSIS — N2581 Secondary hyperparathyroidism of renal origin: Secondary | ICD-10-CM | POA: Diagnosis not present

## 2021-12-19 DIAGNOSIS — M797 Fibromyalgia: Secondary | ICD-10-CM | POA: Diagnosis not present

## 2021-12-19 DIAGNOSIS — Z885 Allergy status to narcotic agent status: Secondary | ICD-10-CM | POA: Diagnosis not present

## 2021-12-19 DIAGNOSIS — Z833 Family history of diabetes mellitus: Secondary | ICD-10-CM | POA: Diagnosis not present

## 2021-12-19 HISTORY — PX: IR FLUORO GUIDE CV LINE LEFT: IMG2282

## 2021-12-19 HISTORY — PX: IR US GUIDE VASC ACCESS LEFT: IMG2389

## 2021-12-19 HISTORY — PX: IR REMOVAL TUN CV CATH W/O FL: IMG2289

## 2021-12-19 LAB — HIV ANTIBODY (ROUTINE TESTING W REFLEX): HIV Screen 4th Generation wRfx: NONREACTIVE

## 2021-12-19 LAB — HEPATITIS B SURFACE ANTIBODY,QUALITATIVE: Hep B S Ab: REACTIVE — AB

## 2021-12-19 LAB — RENAL FUNCTION PANEL
Albumin: 3.2 g/dL — ABNORMAL LOW (ref 3.5–5.0)
Anion gap: 12 (ref 5–15)
BUN: 51 mg/dL — ABNORMAL HIGH (ref 6–20)
CO2: 22 mmol/L (ref 22–32)
Calcium: 8.5 mg/dL — ABNORMAL LOW (ref 8.9–10.3)
Chloride: 104 mmol/L (ref 98–111)
Creatinine, Ser: 13.2 mg/dL — ABNORMAL HIGH (ref 0.44–1.00)
GFR, Estimated: 3 mL/min — ABNORMAL LOW
Glucose, Bld: 115 mg/dL — ABNORMAL HIGH (ref 70–99)
Phosphorus: 7.4 mg/dL — ABNORMAL HIGH (ref 2.5–4.6)
Potassium: 6.7 mmol/L (ref 3.5–5.1)
Sodium: 138 mmol/L (ref 135–145)

## 2021-12-19 LAB — HEMOGLOBIN A1C
Hgb A1c MFr Bld: 5.6 % (ref 4.8–5.6)
Mean Plasma Glucose: 114.02 mg/dL

## 2021-12-19 LAB — GLUCOSE, CAPILLARY
Glucose-Capillary: 140 mg/dL — ABNORMAL HIGH (ref 70–99)
Glucose-Capillary: 139 mg/dL — ABNORMAL HIGH (ref 70–99)

## 2021-12-19 LAB — CBC
HCT: 31.6 % — ABNORMAL LOW (ref 36.0–46.0)
Hemoglobin: 9.7 g/dL — ABNORMAL LOW (ref 12.0–15.0)
MCH: 31.4 pg (ref 26.0–34.0)
MCHC: 30.7 g/dL (ref 30.0–36.0)
MCV: 102.3 fL — ABNORMAL HIGH (ref 80.0–100.0)
Platelets: 181 K/uL (ref 150–400)
RBC: 3.09 MIL/uL — ABNORMAL LOW (ref 3.87–5.11)
RDW: 16.2 % — ABNORMAL HIGH (ref 11.5–15.5)
WBC: 7.3 K/uL (ref 4.0–10.5)
nRBC: 0.3 % — ABNORMAL HIGH (ref 0.0–0.2)

## 2021-12-19 LAB — POTASSIUM
Potassium: 5.5 mmol/L — ABNORMAL HIGH (ref 3.5–5.1)
Potassium: 5 mmol/L (ref 3.5–5.1)
Potassium: 4.1 mmol/L (ref 3.5–5.1)

## 2021-12-19 LAB — HEPATITIS B SURFACE ANTIGEN: Hepatitis B Surface Ag: NONREACTIVE

## 2021-12-19 MED ORDER — CHLORHEXIDINE GLUCONATE 4 % EX LIQD
CUTANEOUS | Status: AC
  Filled 2021-12-19: qty 15

## 2021-12-19 MED ORDER — CALCIUM ACETATE (PHOS BINDER) 667 MG PO CAPS: 1334.0000 mg | ORAL_CAPSULE | Freq: Three times a day (TID) | ORAL | Status: DC

## 2021-12-19 MED ORDER — FENTANYL CITRATE (PF) 100 MCG/2ML IJ SOLN
INTRAMUSCULAR | Status: AC | PRN
  Administered 2021-12-19 (×2): 25 ug via INTRAVENOUS

## 2021-12-19 MED ORDER — SODIUM CHLORIDE 0.9 % IV SOLN: 100.0000 mL | INTRAVENOUS | Status: DC | PRN

## 2021-12-19 MED ORDER — MIDAZOLAM HCL 2 MG/2ML IJ SOLN
INTRAMUSCULAR | Status: AC
  Filled 2021-12-19: qty 2

## 2021-12-19 MED ORDER — LIDOCAINE HCL 1 % IJ SOLN
INTRAMUSCULAR | Status: AC | PRN
  Administered 2021-12-19: 5 mL via RESPIRATORY_TRACT

## 2021-12-19 MED ORDER — ACETAMINOPHEN 650 MG RE SUPP: 650.0000 mg | Freq: Four times a day (QID) | RECTAL | Status: DC | PRN

## 2021-12-19 MED ORDER — MIDAZOLAM HCL 2 MG/2ML IJ SOLN
INTRAMUSCULAR | Status: AC | PRN
  Administered 2021-12-19 (×2): .5 mg via INTRAVENOUS

## 2021-12-19 MED ORDER — FENTANYL CITRATE (PF) 100 MCG/2ML IJ SOLN
INTRAMUSCULAR | Status: AC
  Filled 2021-12-19: qty 2

## 2021-12-19 MED ORDER — GABAPENTIN 300 MG PO CAPS: 300.0000 mg | ORAL_CAPSULE | ORAL | Status: DC

## 2021-12-19 MED ORDER — FUROSEMIDE 10 MG/ML IJ SOLN
120.0000 mg | Freq: Once | INTRAVENOUS | Status: AC
  Administered 2021-12-19: 07:00:00 120 mg via INTRAVENOUS
  Filled 2021-12-19: qty 10

## 2021-12-19 MED ORDER — FUROSEMIDE 10 MG/ML IJ SOLN
160.0000 mg | Freq: Once | INTRAVENOUS | Status: DC
  Filled 2021-12-19: qty 16

## 2021-12-19 MED ORDER — HEPARIN SODIUM (PORCINE) 1000 UNIT/ML IJ SOLN
INTRAMUSCULAR | Status: AC | PRN
  Administered 2021-12-19: 3800 [IU] via INTRAVENOUS

## 2021-12-19 MED ORDER — LIDOCAINE HCL 1 % IJ SOLN
INTRAMUSCULAR | Status: AC
  Filled 2021-12-19: qty 20

## 2021-12-19 MED ORDER — LIDOCAINE HCL (PF) 1 % IJ SOLN: 5.0000 mL | INTRAMUSCULAR | Status: DC | PRN

## 2021-12-19 MED ORDER — CEFAZOLIN SODIUM-DEXTROSE 2-4 GM/100ML-% IV SOLN
INTRAVENOUS | Status: AC | PRN
  Administered 2021-12-19: 2 g via INTRAVENOUS

## 2021-12-19 MED ORDER — CEFAZOLIN SODIUM-DEXTROSE 2-4 GM/100ML-% IV SOLN
INTRAVENOUS | Status: AC
  Filled 2021-12-19: qty 100

## 2021-12-19 MED ORDER — SODIUM BICARBONATE 8.4 % IV SOLN
50.0000 meq | Freq: Once | INTRAVENOUS | Status: AC
  Administered 2021-12-19: 04:00:00 50 meq via INTRAVENOUS
  Filled 2021-12-19: qty 50

## 2021-12-19 MED ORDER — INSULIN ASPART 100 UNIT/ML IV SOLN
5.0000 [IU] | Freq: Once | INTRAVENOUS | Status: AC
  Administered 2021-12-19: 04:00:00 5 [IU] via INTRAVENOUS

## 2021-12-19 MED ORDER — HEPARIN SODIUM (PORCINE) 1000 UNIT/ML IJ SOLN
INTRAMUSCULAR | Status: AC
  Filled 2021-12-19: qty 10

## 2021-12-19 MED ORDER — ALTEPLASE 2 MG IJ SOLR: 2.0000 mg | Freq: Once | INTRAMUSCULAR | Status: DC | PRN

## 2021-12-19 MED ORDER — PENTAFLUOROPROP-TETRAFLUOROETH EX AERO: 1.0000 "application " | INHALATION_SPRAY | CUTANEOUS | Status: DC | PRN

## 2021-12-19 MED ORDER — DEXTROSE 50 % IV SOLN
1.0000 | Freq: Once | INTRAVENOUS | Status: AC
  Administered 2021-12-19: 04:00:00 50 mL via INTRAVENOUS
  Filled 2021-12-19: qty 50

## 2021-12-19 MED ORDER — ALBUTEROL SULFATE (2.5 MG/3ML) 0.083% IN NEBU
10.0000 mg | INHALATION_SOLUTION | Freq: Once | RESPIRATORY_TRACT | Status: AC
  Administered 2021-12-19: 04:00:00 10 mg via RESPIRATORY_TRACT
  Filled 2021-12-19: qty 12

## 2021-12-19 MED ORDER — CHLORHEXIDINE GLUCONATE CLOTH 2 % EX PADS
6.0000 | MEDICATED_PAD | Freq: Every day | CUTANEOUS | Status: DC
  Administered 2021-12-18 – 2021-12-19 (×2): 6 via TOPICAL

## 2021-12-19 MED ORDER — HEPARIN SODIUM (PORCINE) 1000 UNIT/ML DIALYSIS: 1000.0000 [IU] | INTRAMUSCULAR | Status: DC | PRN

## 2021-12-19 MED ORDER — LIDOCAINE-PRILOCAINE 2.5-2.5 % EX CREA: 1.0000 "application " | TOPICAL_CREAM | CUTANEOUS | Status: DC | PRN

## 2021-12-19 MED ORDER — ACETAMINOPHEN 500 MG PO TABS
1000.0000 mg | ORAL_TABLET | Freq: Four times a day (QID) | ORAL | Status: DC | PRN
  Administered 2021-12-19: 17:00:00 1000 mg via ORAL
  Filled 2021-12-19: qty 2

## 2021-12-19 NOTE — Progress Notes (Signed)
[facility] Progress Note    Assessment/ Plan:   # ESRD:  -outpatient orders: [place], TTS, 3.75 HRS, 400/700, 2K/2CAL, TDC, EDW 95KG, UF PROFILE 4. Heparin 7k bolus, Mircera 228mcg q2wks (last dose 12/13) -HD today once catheter has been replaced, hopefully can be discharged after HD   # Dialysis catheter malfntn -per primary service, tdc replacement should be today with IR   # Hyperkalemia -lokelma 10g until she is on HD then can stop. Renal diet   # Volume/ hypertension: EDW 95kg. Attempt to achieve EDW as tolerated. Resume home meds   # Anemia of Chronic Kidney Disease: Hemoglobin 9.7. Just received mircera this week, hold off on further doses   # Secondary Hyperparathyroidism/Hyperphosphatemia: resumed home binders    # Additional recommendations: - Dose all meds for creatinine clearance < 10 ml/min  - Unless absolutely necessary, no MRIs with gadolinium.  - Implement save arm precautions.  Prefer needle sticks in the dorsum of the hands or wrists.  No blood pressure measurements in arm. - If blood transfusion is requested during hemodialysis sessions, please alert [name] prior to the session.  - If a hemodialysis catheter line culture is requested, please alert [name] as only hemodialysis nurses are able to collect those specimens.    Gean Quint, MD [facility]  Subjective:   Hyperkalemic overnight with k of 6.7--shifted, rec'd lasix. Improved down to 5.5. open for tdc replacement this morning.   Objective:   BP 116/63 (BP Location: Right Arm)    Pulse (!) 109    Temp 97.8 F (36.6 C) (Oral)    Resp 18    Wt 98.3 kg    LMP  (LMP Unknown)    SpO2 100%    BMI 32.95 kg/m  No intake or output data in the 24 hours ending 12/19/21 0928 Weight change:   Physical Exam: Gen:nad CVS:s1s2 Resp:cta bl ZOX:WRUE Ext:rt bka, trace edema LLE Neuro: awake, alert Dialysis access: [facility] dislodged  Imaging: DG Chest 2 View  Result Date:  12/18/2021 CLINICAL DATA:  Dialysis catheter coming out EXAM: CHEST - 2 VIEW COMPARISON:  12/17/2021 FINDINGS: The left dialysis catheter has retracted further with the tip projected over the left clavicle, likely extravascular. Heart is borderline in size. Lungs clear. No effusions. No acute bony abnormality. Old left rib fractures. IMPRESSION: Left dialysis catheter has retracted further with the tip likely extravascular in the left upper chest wall. Electronically Signed   By: Rolm Baptise M.D.   On: 12/18/2021 12:39   DG Chest 2 View  Result Date: 12/17/2021 CLINICAL DATA:  Dialysis catheter partially out EXAM: CHEST - 2 VIEW COMPARISON:  09/25/2021 FINDINGS: Left dialysis catheter has pulled back. The tip is in the left upper chest. It is difficult to determine if this is chest into the left internal jugular vein or outside the vein in the left upper chest wall. Cardiomegaly. No confluent airspace opacities, effusions or edema. IMPRESSION: Tip of the left dialysis catheter is in the left upper chest and could be outside the vein or just into the left internal jugular vein. No acute cardiopulmonary disease. Electronically Signed   By: Rolm Baptise M.D.   On: 12/17/2021 10:48    Labs: BMET Recent Labs  Lab 12/18/21 1203 12/19/21 0309 12/19/21 0654  NA 136 138  --   K 6.0* 6.7* 5.5*  CL 102 104  --   CO2 20* 22  --   GLUCOSE 101* 115*  --  BUN 47* 51*  --   CREATININE 12.69* 13.20*  --   CALCIUM 8.8* 8.5*  --   PHOS  --  7.4*  --    CBC Recent Labs  Lab 12/18/21 1203 12/19/21 0654  WBC 6.8 7.3  HGB 9.9* 9.7*  HCT 34.9* 31.6*  MCV 107.1* 102.3*  PLT PLATELET CLUMPS NOTED ON SMEAR, UNABLE TO ESTIMATE 181    Medications:     Chlorhexidine Gluconate Cloth  6 each Topical Daily   hydrALAZINE  20 mg Intravenous STAT   insulin aspart  0-6 Units Subcutaneous TID WC   sodium chloride flush  3 mL Intravenous Q12H   sodium zirconium cyclosilicate  10 g Oral TID      Gean Quint, MD [facility] 12/19/2021, 9:28 AM

## 2021-12-19 NOTE — Significant Event (Signed)
Repeat K at 6.7 this AM up from 6.0.  1) repeat EKG performed, no significant changes 2) already got lokelma in ED 3) ordering insulin + dextrose 4) ordering amp of bicarb 5) ordering 10mg  albuterol neb 6) RN to call nephrology on call (though pt has no access at this time it seems). 7) given no EKG changes, hyperphosphatemia, will hold off on calcium. 8) K lab Q4H with next draw at 0700

## 2021-12-19 NOTE — Consult Note (Signed)
Chief Complaint: Patient was seen in consultation today for tunneled HD catheter exchange/possible new placement.  Referring Physician(s): Gean Quint, MD  Supervising Physician: Sandi Mariscal  Patient Status: [facility] - In-pt  History of Present Illness: Heather [name] is a 50 y.o. female with a past medical history significant for CVA, previous PE, HTN, DM and ESRD on HD who presented to [facility] ED yesterday with complaints of her tunneled HD catheter coming out. She noted that her dialysis catheter appeared to be coming out the day prior and she was unable to complete out patient HD (last HD 12/20). IR has been consulted for HD catheter exchange/possible new placement.  [name] confirms she has not had HD since Tuesday because of her catheter coming out. The exit site is tender but has not had any pus or redness that she is aware of. She notes that since her HD catheter was placed on the left that it bleeds daily which is very frustrating, when the catheter was on the right side it never bled. [name] hoping the catheter can be placed on the right side again but understands that may not be possible. She understands the procedure today and is agreeable to proceed.  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[place]), nonhemorrhagic, inferior right cerebellum 12/03/2017   left side weakness in leg   Cyst of left ovary 01/12/2018   Diabetic neuropathy, painful ([place]), on low dose Gabapentin 07/13/2013   DM (diabetes mellitus), type 2, uncontrolled, with renal complications 82/99/3716   DM2 (diabetes mellitus, type 2) ([place])    Dysfunction of left eustachian tube, with pusatile tinnitus 11/24/2017   Dyslipidemia associated with type 2 diabetes mellitus ([place]) 06/25/2016   ESRD (end stage renal  disease) on dialysis [facility])    On hemodialysis in July 2019 via [facility] then switched to [facility] in Nov 2019.    ESRD with anemia (HCC)    Fibromyalgia    Gastroparesis due to DM    GERD (gastroesophageal reflux disease)    Headache    Hypertension associated with diabetes ([place]) 02/19/2011   IBS (irritable bowel syndrome)    Left-sided weakness 12/10/2017   .   Leiomyoma of uterus    Pancreatitis    PE (pulmonary thromboembolism) ([place]) 11/04/2019   Pneumonia    Proliferative diabetic retinopathy ([place]) 09/29/2015   Pronation deformity of both feet 06/14/2014   Reactive depression 12/10/2017   .   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   Thromboembolism ([place]) 04/05/2018   Upper airway cough syndrome, with recs to stay off ACE and take Pepcid q hs 11/15/2016    Past Surgical History:  Procedure Laterality Date   A/V FISTULAGRAM Right 03/03/2020   Procedure: Venogram;  Surgeon: Waynetta Sandy, MD;  Location: [place] [facility];  Service: Cardiovascular;  Laterality: Right;   ARTERY REPAIR Right 02/17/2018   Procedure: EXPLORATION OF RIGHT BRACHIAL ARTERY;  Surgeon: Conrad Maybeury, MD;  Location: [place];  Service: Vascular;  Laterality: Right;   ARTERY REPAIR Right 02/17/2018   Procedure: BRACHIAL ARTERY EXPLORATION AND TRHOMBECTOMY;  Surgeon: Conrad Bruno, MD;  Location: [place];  Service: Vascular;  Laterality: Right;   AV FISTULA PLACEMENT Left 05/09/2017  Procedure: INSERTION OF ARTERIOVENOUS (AV) GRAFT ARM (ARTEGRAFT);  Surgeon: Conrad Summerfield, MD;  Location: [place];  Service: Vascular;  Laterality: Left;   AV FISTULA PLACEMENT Right 02/17/2018   Procedure: INSERTION OF ARTERIOVENOUS (AV) GORE-TEX GRAFT ARM RIGHT UPPER ARM;  Surgeon: Conrad Jermyn, MD;  Location: [place];  Service: Vascular;  Laterality: Right;   AV FISTULA PLACEMENT Left 10/10/2019   Procedure: INSERTION OF LEFT  ARTERIOVENOUS (AV) ARTEGRAFT GRAFT ARM;  Surgeon: Waynetta Sandy, MD;  Location: [place];  Service: Vascular;  Laterality: Left;   AV FISTULA PLACEMENT Right 03/19/2020   Procedure: INSERTION OF ARTERIOVENOUS (AV) GORE-TEX GRAFT IN RIGHT ARM AND VIABAHN STENT IN RIGHT AXILLARY ARTERY;  Surgeon: Waynetta Sandy, MD;  Location: [place];  Service: Vascular;  Laterality: Right;   [place] Left 08/31/2016   Procedure: BASILIC VEIN TRANSPOSITION FIRST STAGE;  Surgeon: Conrad Peyton, MD;  Location: [place];  Service: Vascular;  Laterality: Left;   BRONCHIAL WASHINGS  09/17/2021   Procedure: BRONCHIAL WASHINGS;  Surgeon: Collene Gobble, MD;  Location: [facility];  Service: Cardiopulmonary;;   CENTRAL VENOUS CATHETER INSERTION Left 07/24/2018   Procedure: INSERTION CENTRAL LINE ADULT;  Surgeon: Conrad Lionville, MD;  Location: [place];  Service: Vascular;  Laterality: Left;   EYE SURGERY     secondary to diabetic retinopathy    FOOT SURGERY Right    t"ook bone out- maybe hammer toe"   INSERTION OF DIALYSIS CATHETER Left 07/24/2018   Procedure: INSERTION OF TUNNELED DIALYSIS CATHETER;  Surgeon: Conrad Fountain, MD;  Location: [place];  Service: Vascular;  Laterality: Left;   IR FLUORO GUIDE CV LINE RIGHT  07/21/2018   IR FLUORO GUIDE CV LINE RIGHT  06/01/2019   IR FLUORO GUIDE CV LINE RIGHT  05/05/2020   IR REMOVAL TUN CV CATH W/O FL  01/23/2021   IR US GUIDE VASC ACCESS RIGHT  07/21/2018   IR US GUIDE VASC ACCESS RIGHT  06/01/2019   LIGATION ARTERIOVENOUS GORTEX GRAFT Right 03/20/2020   Procedure: LIGATION ARM ARTERIOVENOUS GORTEX GRAFT With Patch Angioplasty, Thrombectomy;  Surgeon: Waynetta Sandy, MD;  Location: [place];  Service: Vascular;  Laterality: Right;   LIGATION OF ARTERIOVENOUS  FISTULA Left 05/09/2017   Procedure: LIGATION OF ARTERIOVENOUS  FISTULA;  Surgeon: Conrad Cathlamet, MD;  Location: [place];  Service: Vascular;  Laterality: Left;   LOOP RECORDER INSERTION N/A  12/05/2017   Procedure: LOOP RECORDER INSERTION;  Surgeon: Evans Lance, MD;  Location: [place] [facility];  Service: Cardiovascular;  Laterality: N/A;   MYOMECTOMY     REMOVAL OF GRAFT Right 02/17/2018   Procedure: REMOVAL OF RIGHT UPPER ARM ARTERIOVENOUS GRAFT;  Surgeon: Conrad Guayanilla, MD;  Location: [place];  Service: Vascular;  Laterality: Right;   REVISION OF ARTERIOVENOUS GORETEX GRAFT Left 07/24/2018   Procedure: REDO ARTERIOVENOUS GORETEX GRAFT;  Surgeon: Conrad Audubon, MD;  Location: [place];  Service: Vascular;  Laterality: Left;   TEE WITHOUT CARDIOVERSION N/A 12/05/2017   Procedure: TRANSESOPHAGEAL ECHOCARDIOGRAM (TEE);  Surgeon: Sanda Klein, MD;  Location: [place];  Service: Cardiovascular;  Laterality: N/A;   UPPER EXTREMITY VENOGRAPHY Left 07/13/2018   Procedure: UPPER EXTREMITY VENOGRAPHY - Central & Left Arm;  Surgeon: Conrad Buchanan [place], MD;  Location: [place] [facility];  Service: Cardiovascular;  Laterality: Left;   UPPER EXTREMITY VENOGRAPHY Bilateral 09/10/2019   Procedure: UPPER EXTREMITY VENOGRAPHY;  Surgeon: Waynetta Sandy, MD;  Location: [facility]  INVASIVE CV [facility];  Service: Cardiovascular;  Laterality: Bilateral;   UTERINE FIBROID SURGERY     VIDEO BRONCHOSCOPY N/A 09/17/2021   Procedure: VIDEO BRONCHOSCOPY WITHOUT FLUORO;  Surgeon: Collene Gobble, MD;  Location: [facility];  Service: Cardiopulmonary;  Laterality: N/A;   VITRECTOMY Bilateral     Allergies: Ibuprofen, Dilaudid [hydromorphone hcl], Iodinated contrast media, and Tramadol  Medications: Prior to Admission medications   Medication Sig Start Date End Date Taking? Authorizing Provider  calcium acetate (PHOSLO) 667 MG capsule Take 667 mg by mouth 3 (three) times daily with meals. 08/25/21  Yes [provider]  gabapentin (NEURONTIN) 300 MG capsule Take 1 capsule (300 mg total) by mouth 3 (three) times daily. Take after dialysis. Do not take more than three times weekly. 09/17/21  Yes Collene Gobble, MD  insulin aspart (NOVOLOG FLEXPEN) 100 UNIT/ML FlexPen Inject 6 Units into the skin 3 (three) times daily with meals. Sliding scale 07/16/20  Yes [name], MD  insulin degludec (TRESIBA FLEXTOUCH) 100 UNIT/ML SOPN FlexTouch Pen Inject 0.26 mLs (26 Units total) into the skin at bedtime. 12/26/19  Yes Shamleffer, Melanie Crazier, MD  midodrine (PROAMATINE) 5 MG tablet Take 1 tablet (5 mg total) by mouth See admin instructions. Given at Dialysis if needed 09/17/21  Yes Byrum, Rose Fillers, MD  Continuous Blood Gluc Sensor (FREESTYLE LIBRE 14 DAY SENSOR) MISC 1 patch by Does not apply route every 14 (fourteen) days. 07/10/21   [name], MD  doxycycline (VIBRA-TABS) 100 MG tablet Take 1 tablet (100 mg total) by mouth 2 (two) times daily. Patient not taking: Reported on 12/18/2021 10/28/21   Inda Coke, PA  Insulin Pen Needle 29G X 5MM MISC 1 Device by Does not apply route 4 (four) times daily. 12/26/19   Shamleffer, Melanie Crazier, MD  metroNIDAZOLE (METROGEL) 0.75 % vaginal gel Insert applicatorful hs x 5 nights. Patient not taking: Reported on 12/18/2021 10/28/21   Salvadore Dom, MD  pantoprazole (PROTONIX) 40 MG tablet Take 1 tablet (40 mg total) by mouth daily. Patient not taking: Reported on 12/18/2021 11/05/21   Autry-Lott, Naaman Plummer, DO  predniSONE (DELTASONE) 50 MG tablet One tablet 13 hours, 7 hours and 1 hour prior to procedure. Patient not taking: Reported on 12/18/2021 11/25/21   Theresa Duty, NP     Family History  Problem Relation Age of Onset   Colon polyps Mother    Diabetes Mother    Heart murmur Father        history essentially unknown   Hypertension Father    Diabetes Sister    Kidney failure Sister        kidney transplant   Diabetes Brother    Retinal degeneration Brother    Heart murmur Son    Stroke Paternal Grandmother    Diabetes Sister     Social History   Socioeconomic History   Marital status: Married    Spouse name: Not  on file   Number of children: 1   Years of education: college   Highest education level: Not on file  Occupational History   Occupation: customer service rep    Employer: [employer]  Tobacco Use   Smoking status: Never   Smokeless tobacco: Never  Vaping Use   Vaping Use: Never used  Substance and Sexual Activity   Alcohol use: No    Alcohol/week: 0.0 standard drinks   Drug use: No   Sexual activity: Not Currently    Partners: Male  Birth control/protection: I.U.D.  Other Topics Concern   Not on file  Social History Narrative   Patient lives with fiance. She has 1 grown son. Works full-time, she Paramedic for attorney.   Caffeine Use: 2 cups daily; sodas occasionally   She has 7 grandchildren   Social Determinants of Radio broadcast assistant Strain: Not on file  Food Insecurity: Not on file  Transportation Needs: Not on file  Physical Activity: Not on file  Stress: Not on file  Social Connections: Not on file     Review of Systems: A 12 point ROS discussed and pertinent positives are indicated in the HPI above.  All other systems are negative.  Review of Systems  Constitutional:  Negative for chills and fever.  Respiratory:  Negative for cough and shortness of breath.   Cardiovascular:  Negative for chest pain.  Gastrointestinal:  Negative for abdominal pain, nausea and vomiting.  Musculoskeletal:  Negative for back pain.  Neurological:  Negative for headaches.   Vital Signs: BP 116/63 (BP Location: Right Arm)    Pulse (!) 109    Temp 97.8 F (36.6 C) (Oral)    Resp 18    Wt 216 lb 11.4 oz (98.3 kg)    LMP  (LMP Unknown)    SpO2 100%    BMI 32.95 kg/m   Physical Exam Vitals and nursing note reviewed.  Constitutional:      General: [name] not in acute distress. HENT:     Head: Normocephalic.     Mouth/Throat:     Mouth: Mucous membranes are moist.     Pharynx: Oropharynx is clear. No oropharyngeal exudate or posterior oropharyngeal erythema.   Cardiovascular:     Rate and Rhythm: Regular rhythm. Tachycardia present.     Comments: (+) left chest tunneled HD catheter - catheter appears to dislodged however no cuff visualized. Mildly TTP. No erythema, edema, drainage or active bleeding. Pulmonary:     Effort: Pulmonary effort is normal.     Breath sounds: Normal breath sounds.  Abdominal:     Palpations: Abdomen is soft.  Skin:    General: Skin is warm and dry.  Neurological:     Mental Status: [name] alert and oriented to person, place, and time.  Psychiatric:        Mood and Affect: Mood normal.        Behavior: Behavior normal.        Thought Content: Thought content normal.        Judgment: Judgment normal.     MD Evaluation Airway: WNL Heart: WNL Abdomen: WNL Chest/ Lungs: WNL ASA  Classification: 3 Mallampati/Airway Score: Two   Imaging: DG Chest 2 View  Result Date: 12/18/2021 CLINICAL DATA:  Dialysis catheter coming out EXAM: CHEST - 2 VIEW COMPARISON:  12/17/2021 FINDINGS: The left dialysis catheter has retracted further with the tip projected over the left clavicle, likely extravascular. Heart is borderline in size. Lungs clear. No effusions. No acute bony abnormality. Old left rib fractures. IMPRESSION: Left dialysis catheter has retracted further with the tip likely extravascular in the left upper chest wall. Electronically Signed   By: Rolm Baptise M.D.   On: 12/18/2021 12:39   DG Chest 2 View  Result Date: 12/17/2021 CLINICAL DATA:  Dialysis catheter partially out EXAM: CHEST - 2 VIEW COMPARISON:  09/25/2021 FINDINGS: Left dialysis catheter has pulled back. The tip is in the left upper chest. It is difficult to determine if this is chest  into the left internal jugular vein or outside the vein in the left upper chest wall. Cardiomegaly. No confluent airspace opacities, effusions or edema. IMPRESSION: Tip of the left dialysis catheter is in the left upper chest and could be outside the vein or just into the  left internal jugular vein. No acute cardiopulmonary disease. Electronically Signed   By: Rolm Baptise M.D.   On: 12/17/2021 10:48    Labs:  CBC: Recent Labs    10/16/21 1703 11/05/21 1034 12/18/21 1203 12/19/21 0654  WBC 10.9* 6.7 6.8 7.3  HGB 8.8* 9.1* 9.9* 9.7*  HCT 29.5* 29.6* 34.9* 31.6*  PLT 196 190 PLATELET CLUMPS NOTED ON SMEAR, UNABLE TO ESTIMATE 181    COAGS: No results for input(s): INR, APTT in the last 8760 hours.  BMP: Recent Labs    10/16/21 1703 11/05/21 1034 12/18/21 1203 12/19/21 0309 12/19/21 0654  NA 140 138 136 138  --   K 5.1 4.0 6.0* 6.7* 5.5*  CL 98 96* 102 104  --   CO2 28 31 20* 22  --   GLUCOSE 237* 121* 101* 115*  --   BUN 25* 40* 47* 51*  --   CALCIUM 9.3 9.6 8.8* 8.5*  --   CREATININE 9.01* 7.94* 12.69* 13.20*  --   GFRNONAA 5* 6* 3* 3*  --     LIVER FUNCTION TESTS: Recent Labs    10/16/21 1703 11/05/21 1034 12/19/21 0309  BILITOT 0.4 0.6  --   AST 24 30  --   ALT 26 46*  --   ALKPHOS 107 98  --   PROT 8.7* 7.9  --   ALBUMIN 3.4* 4.0 3.2*    TUMOR MARKERS: No results for input(s): AFPTM, CEA, CA199, CHROMGRNA in the last 8760 hours.  Assessment and Plan:  50 y/o F with history of ESRD on HD (T/Th/S) who presented to [facility] ED yesterday due to left chest HD catheter becoming dislodged -- per patient she was unable to undergo HD because of this so last HD session was 12/20. IR has been consulted for HD catheter exchange/possible new placement.  Patient NPO since midnight, K+ 5.5 this AM following Lokelma.   Risks and benefits discussed with the patient including, but not limited to bleeding, infection, vascular injury, pneumothorax which may require chest tube placement, air embolism or even death.  All of the patient's questions were answered, patient is agreeable to proceed.  Consent signed and in chart.  Thank you for this interesting consult.  I greatly enjoyed meeting SYNETHIA ENDICOTT and look forward to  participating in their care.  A copy of this report was sent to the requesting provider on this date.  Electronically Signed: Joaquim Nam, PA-C 12/19/2021, 9:38 AM   I spent a total of 20 Minutes in face to face in clinical consultation, greater than 50% of which was counseling/coordinating care for tunneled HD catheter placement.

## 2021-12-19 NOTE — Discharge Summary (Signed)
Physician Discharge Summary  BEVERELY SUEN QPR:[id number] DOB: 1971-02-15 DOA: 12/18/2021  PCP: [name], MD  Admit date: 12/18/2021 Discharge date: 12/19/2021  Admitted From: Home Disposition: Home   Recommendations for Outpatient Follow-up:  Follow up with nephrology/HD per routine  Home Health: None Equipment/Devices: None Discharge Condition: Stable CODE STATUS: Full Diet recommendation: Renal  Brief/Interim Summary: Heather [name] is a 50 y.o. female with medical history significant for ESRD on TTS HD via HD cath, history of CVA, insulin-dependent T2DM, HTN, Charcot foot s/p right BKA, history of DVT not on anticoagulation, anemia of chronic kidney disease who presented to the ED 12/23 with a dislodged hemodialysis catheter. Her last dialysis was 12/20, and she reported some mild leg swelling since that time. She was hypertensive with otherwise normal vital signs. Potassium 6, bicarb 20, BUN 47, Cr 12. CXR confirmed retracted left-sided dialysis catheter with tip extravascular in chest wall. Nephrology was consulted, lokelma given and the patient observed in progressive care unit. IR was consulted 12/24 for urgent HD catheter placement and dialysis same day. She was cleared for discharge by nephrology following dialysis treatment and was discharged in stable condition.  Discharge Diagnoses:  Principal Problem:   Displacement of vascular dialysis catheter, initial encounter [facility]) Active Problems:   Diabetes mellitus, type II, insulin dependent ([place]), on CGM, with end organ damage: renal, neuropathy, gastroparesis, retinopathy   Anemia in chronic kidney disease, on chronic dialysis ([place])   Hypertensive urgency   Hyperkalemia   ESRD on dialysis ([place]) Obesity: Estimated body mass index is 33.89 kg/m as calculated from the following:   Height as of 11/05/21: '5\' 8"'  (1.727 m).   Weight as of this encounter: 101.1 kg.  Discharge Instructions Discharge  Instructions     Discharge instructions   Complete by: As directed    You were evaluated for a dislodged hemodialysis catheter which was removed and replaced. You've tolerated hemodialysis and are stable for discharge per the nephrology and medical team.   Gabapentin was listed as 370m three times daily on your medication list, though this is an error. A note states it should not be taken more than 3 times weekly, which would be appropriate. You should not exceed a maximum of 3015mof gabapentin per day at the very most. This should be taken after dialysis on HD days.   You should return if the catheter site shows evidence of infection or becomes dislodged again or any other worrisome symptoms develop. Otherwise follow up per routine with your PCP and dialysis as scheduled. Happy Holidays!   No wound care   Complete by: As directed       Allergies as of 12/19/2021       Reactions   Ibuprofen Other (See Comments)   CKD stage 3. Should avoid.   Dilaudid [hydromorphone Hcl] Itching, Other (See Comments)   Can take with Benadryl.   Iodinated Contrast Media Rash   Tramadol Itching        Medication List     STOP taking these medications    doxycycline 100 MG tablet Commonly known as: VIBRA-TABS   metroNIDAZOLE 0.75 % vaginal gel Commonly known as: METROGEL   pantoprazole 40 MG tablet Commonly known as: Protonix   predniSONE 50 MG tablet Commonly known as: DELTASONE       TAKE these medications    calcium acetate 667 MG capsule Commonly known as: PHOSLO Take 667 mg by mouth 3 (three) times daily with meals.   FreeStyle [employer]  14 Day Sensor Misc 1 patch by Does not apply route every 14 (fourteen) days.   gabapentin 300 MG capsule Commonly known as: NEURONTIN Take 1 capsule (300 mg total) by mouth 3 (three) times a week. Take after dialysis. Do not take more than three times weekly. Start taking on: December 21, 2021 What changed: when to take this   Insulin Pen  Needle 29G X 5MM Misc 1 Device by Does not apply route 4 (four) times daily.   midodrine 5 MG tablet Commonly known as: PROAMATINE Take 1 tablet (5 mg total) by mouth See admin instructions. Given at Dialysis if needed   NovoLOG FlexPen 100 UNIT/ML FlexPen Generic drug: insulin aspart Inject 6 Units into the skin 3 (three) times daily with meals. Sliding scale   Tresiba FlexTouch 100 UNIT/ML FlexTouch Pen Generic drug: insulin degludec Inject 0.26 mLs (26 Units total) into the skin at bedtime.        Follow-up Information     [name], MD Follow up.   Specialty: Family Medicine Contact information: [address] [place] [address] [phone]         Dwana Melena, MD Follow up.   Specialty: Nephrology Contact information: [place] [address] [phone]                Allergies  Allergen Reactions   Ibuprofen Other (See Comments)    CKD stage 3. Should avoid.   Dilaudid [Hydromorphone Hcl] Itching and Other (See Comments)    Can take with Benadryl.   Iodinated Contrast Media Rash   Tramadol Itching    Consultations: Nephrology IR  Procedures/Studies: DG Chest 2 View  Result Date: 12/18/2021 CLINICAL DATA:  Dialysis catheter coming out EXAM: CHEST - 2 VIEW COMPARISON:  12/17/2021 FINDINGS: The left dialysis catheter has retracted further with the tip projected over the left clavicle, likely extravascular. Heart is borderline in size. Lungs clear. No effusions. No acute bony abnormality. Old left rib fractures. IMPRESSION: Left dialysis catheter has retracted further with the tip likely extravascular in the left upper chest wall. Electronically Signed   By: Rolm Baptise M.D.   On: 12/18/2021 12:39   DG Chest 2 View  Result Date: 12/17/2021 CLINICAL DATA:  Dialysis catheter partially out EXAM: CHEST - 2 VIEW COMPARISON:  09/25/2021 FINDINGS: Left dialysis catheter has pulled back. The tip is in the left upper chest. It is  difficult to determine if this is chest into the left internal jugular vein or outside the vein in the left upper chest wall. Cardiomegaly. No confluent airspace opacities, effusions or edema. IMPRESSION: Tip of the left dialysis catheter is in the left upper chest and could be outside the vein or just into the left internal jugular vein. No acute cardiopulmonary disease. Electronically Signed   By: Rolm Baptise M.D.   On: 12/17/2021 10:48   IR Removal Tun Cv Cath W/O FL  Result Date: 12/19/2021 INDICATION: Inadvertent retraction of chronic left internal jugular approach dialysis catheter. Patient presents today for fluoroscopic guided exchange versus definitive replacement. EXAM: TUNNELED CENTRAL VENOUS HEMODIALYSIS CATHETER PLACEMENT WITH ULTRASOUND AND FLUOROSCOPIC GUIDANCE MEDICATIONS: Ancef 2 gm IV . The antibiotic was given in an appropriate time interval prior to skin puncture. ANESTHESIA/SEDATION: Moderate (conscious) sedation was employed during this procedure as administered by the Interventional Radiology RN. A total of Versed 1 mg and Fentanyl 50 mcg was administered intravenously. Moderate Sedation Time: 35 minutes. The patient's level of consciousness and vital signs  were monitored continuously by radiology nursing throughout the procedure under my direct supervision. FLUOROSCOPY TIME:  6 minutes (837 mGy) COMPLICATIONS: None immediate. PROCEDURE: Informed written consent was obtained from the patient after a discussion of the risks, benefits, and alternatives to treatment. Questions regarding the procedure were encouraged and answered. The left neck and chest and external portion of the existing dialysis catheter were prepped with chlorhexidine in a sterile fashion, and a sterile drape was applied covering the operative field. Maximum barrier sterile technique with sterile gowns and gloves were used for the procedure. A timeout was performed prior to the initiation of the procedure. Sonographic  evaluation performed left neck demonstrating the left internal jugular approach dialysis catheter had retracted outside the entrance site of the left internal jugular vein. This was confirmed with a spot fluoroscopic image As such the remainder of the bowel positioned dialysis catheter was removed and we proceeded with definitive new left internal jugular approach dialysis catheter placement. Note, the decision was made to proceed with left internal jugular approach dialysis catheter placement as patient states she has an appointment to undergo evaluation for potential creation of a right upper extremity dialysis access. After creating a small venotomy incision, a micropuncture kit was utilized to access the internal jugular vein. Real-time ultrasound guidance was utilized for vascular access including the acquisition of a permanent ultrasound image documenting patency of the accessed vessel. The microwire was utilized to measure appropriate catheter length. A stiff Glidewire was advanced to the level of the IVC and the micropuncture sheath was exchanged for a peel-away sheath. A palindrome tunneled hemodialysis catheter measuring 23 cm from tip to cuff was tunneled in a retrograde fashion from the anterior chest wall to the venotomy incision. The catheter was then placed through the peel-away sheath with tips ultimately positioned within the superior aspect of the right atrium. Final catheter positioning was confirmed and documented with a spot radiographic image. The catheter aspirates and flushes normally. The catheter was flushed with appropriate volume heparin dwells. The catheter exit site was secured with a 0-Prolene retention suture. The venotomy incision was closed with an interrupted 4-0 Vicryl, Dermabond and Steri-strips. Dressings were applied. The patient tolerated the procedure well without immediate post procedural complication. IMPRESSION: 1. Sonographic evaluation performed complete retraction of  existing dialysis catheter outside the left internal jugular vein. As such, the malpositioned dialysis catheter was removed intact. 2. Successful placement of 19 cm tip to cuff tunneled hemodialysis catheter via the left internal jugular vein with tips terminating within the superior aspect of the right atrium. The catheter is ready for immediate use. As above, the decision was made to continue with a left internal jugular approach dialysis catheter, as she states she is to undergo evaluation for creation of a permanent dialysis access within the right upper extremity early next month. Electronically Signed   By: Sandi Mariscal M.D.   On: 12/19/2021 13:51   IR US Guide Vasc Access Left  Result Date: 12/19/2021 INDICATION: Inadvertent retraction of chronic left internal jugular approach dialysis catheter. Patient presents today for fluoroscopic guided exchange versus definitive replacement. EXAM: TUNNELED CENTRAL VENOUS HEMODIALYSIS CATHETER PLACEMENT WITH ULTRASOUND AND FLUOROSCOPIC GUIDANCE MEDICATIONS: Ancef 2 gm IV . The antibiotic was given in an appropriate time interval prior to skin puncture. ANESTHESIA/SEDATION: Moderate (conscious) sedation was employed during this procedure as administered by the Interventional Radiology RN. A total of Versed 1 mg and Fentanyl 50 mcg was administered intravenously. Moderate Sedation Time: 35 minutes. The patient's level  of consciousness and vital signs were monitored continuously by radiology nursing throughout the procedure under my direct supervision. FLUOROSCOPY TIME:  6 minutes (993 mGy) COMPLICATIONS: None immediate. PROCEDURE: Informed written consent was obtained from the patient after a discussion of the risks, benefits, and alternatives to treatment. Questions regarding the procedure were encouraged and answered. The left neck and chest and external portion of the existing dialysis catheter were prepped with chlorhexidine in a sterile fashion, and a sterile  drape was applied covering the operative field. Maximum barrier sterile technique with sterile gowns and gloves were used for the procedure. A timeout was performed prior to the initiation of the procedure. Sonographic evaluation performed left neck demonstrating the left internal jugular approach dialysis catheter had retracted outside the entrance site of the left internal jugular vein. This was confirmed with a spot fluoroscopic image As such the remainder of the bowel positioned dialysis catheter was removed and we proceeded with definitive new left internal jugular approach dialysis catheter placement. Note, the decision was made to proceed with left internal jugular approach dialysis catheter placement as patient states she has an appointment to undergo evaluation for potential creation of a right upper extremity dialysis access. After creating a small venotomy incision, a micropuncture kit was utilized to access the internal jugular vein. Real-time ultrasound guidance was utilized for vascular access including the acquisition of a permanent ultrasound image documenting patency of the accessed vessel. The microwire was utilized to measure appropriate catheter length. A stiff Glidewire was advanced to the level of the IVC and the micropuncture sheath was exchanged for a peel-away sheath. A palindrome tunneled hemodialysis catheter measuring 23 cm from tip to cuff was tunneled in a retrograde fashion from the anterior chest wall to the venotomy incision. The catheter was then placed through the peel-away sheath with tips ultimately positioned within the superior aspect of the right atrium. Final catheter positioning was confirmed and documented with a spot radiographic image. The catheter aspirates and flushes normally. The catheter was flushed with appropriate volume heparin dwells. The catheter exit site was secured with a 0-Prolene retention suture. The venotomy incision was closed with an interrupted 4-0  Vicryl, Dermabond and Steri-strips. Dressings were applied. The patient tolerated the procedure well without immediate post procedural complication. IMPRESSION: 1. Sonographic evaluation performed complete retraction of existing dialysis catheter outside the left internal jugular vein. As such, the malpositioned dialysis catheter was removed intact. 2. Successful placement of 19 cm tip to cuff tunneled hemodialysis catheter via the left internal jugular vein with tips terminating within the superior aspect of the right atrium. The catheter is ready for immediate use. As above, the decision was made to continue with a left internal jugular approach dialysis catheter, as she states she is to undergo evaluation for creation of a permanent dialysis access within the right upper extremity early next month. Electronically Signed   By: Sandi Mariscal M.D.   On: 12/19/2021 13:51   IR TUNNELED CENTRAL VENOUS CATHETER PLACEMENT  Result Date: 12/19/2021 INDICATION: Inadvertent retraction of chronic left internal jugular approach dialysis catheter. Patient presents today for fluoroscopic guided exchange versus definitive replacement. EXAM: TUNNELED CENTRAL VENOUS HEMODIALYSIS CATHETER PLACEMENT WITH ULTRASOUND AND FLUOROSCOPIC GUIDANCE MEDICATIONS: Ancef 2 gm IV . The antibiotic was given in an appropriate time interval prior to skin puncture. ANESTHESIA/SEDATION: Moderate (conscious) sedation was employed during this procedure as administered by the Interventional Radiology RN. A total of Versed 1 mg and Fentanyl 50 mcg was administered intravenously. Moderate Sedation Time:  35 minutes. The patient's level of consciousness and vital signs were monitored continuously by radiology nursing throughout the procedure under my direct supervision. FLUOROSCOPY TIME:  6 minutes (101 mGy) COMPLICATIONS: None immediate. PROCEDURE: Informed written consent was obtained from the patient after a discussion of the risks, benefits, and  alternatives to treatment. Questions regarding the procedure were encouraged and answered. The left neck and chest and external portion of the existing dialysis catheter were prepped with chlorhexidine in a sterile fashion, and a sterile drape was applied covering the operative field. Maximum barrier sterile technique with sterile gowns and gloves were used for the procedure. A timeout was performed prior to the initiation of the procedure. Sonographic evaluation performed left neck demonstrating the left internal jugular approach dialysis catheter had retracted outside the entrance site of the left internal jugular vein. This was confirmed with a spot fluoroscopic image As such the remainder of the bowel positioned dialysis catheter was removed and we proceeded with definitive new left internal jugular approach dialysis catheter placement. Note, the decision was made to proceed with left internal jugular approach dialysis catheter placement as patient states she has an appointment to undergo evaluation for potential creation of a right upper extremity dialysis access. After creating a small venotomy incision, a micropuncture kit was utilized to access the internal jugular vein. Real-time ultrasound guidance was utilized for vascular access including the acquisition of a permanent ultrasound image documenting patency of the accessed vessel. The microwire was utilized to measure appropriate catheter length. A stiff Glidewire was advanced to the level of the IVC and the micropuncture sheath was exchanged for a peel-away sheath. A palindrome tunneled hemodialysis catheter measuring 23 cm from tip to cuff was tunneled in a retrograde fashion from the anterior chest wall to the venotomy incision. The catheter was then placed through the peel-away sheath with tips ultimately positioned within the superior aspect of the right atrium. Final catheter positioning was confirmed and documented with a spot radiographic image.  The catheter aspirates and flushes normally. The catheter was flushed with appropriate volume heparin dwells. The catheter exit site was secured with a 0-Prolene retention suture. The venotomy incision was closed with an interrupted 4-0 Vicryl, Dermabond and Steri-strips. Dressings were applied. The patient tolerated the procedure well without immediate post procedural complication. IMPRESSION: 1. Sonographic evaluation performed complete retraction of existing dialysis catheter outside the left internal jugular vein. As such, the malpositioned dialysis catheter was removed intact. 2. Successful placement of 19 cm tip to cuff tunneled hemodialysis catheter via the left internal jugular vein with tips terminating within the superior aspect of the right atrium. The catheter is ready for immediate use. As above, the decision was made to continue with a left internal jugular approach dialysis catheter, as she states she is to undergo evaluation for creation of a permanent dialysis access within the right upper extremity early next month. Electronically Signed   By: Sandi Mariscal M.D.   On: 12/19/2021 13:51    Successful placement of tunneled HD catheter with tips terminating within the superior aspect of the right atrium. 12/19/2021 by Ronny Bacon, MD  Subjective: Actually feels quite well, would like to go home today if possible. No dyspnea or pain. Mild swelling in legs is stable.   Discharge Exam: Vitals:   12/19/21 1317 12/19/21 1400  BP: (!) 154/72 (!) 103/58  Pulse: 100 (!) 103  Resp:  15  Temp:    SpO2:     General: Pt is alert, awake, not  in acute distress Cardiovascular: RRR, S1/S2 +, no rubs, no gallops Respiratory: CTA bilaterally, no wheezing, no rhonchi Abdominal: Soft, NT, ND, bowel sounds + Extremities: Mild edema, no cyanosis, RLE BKA w/healthy stump.  Labs: BNP (last 3 results) No results for input(s): BNP in the last 8760 hours. Basic Metabolic Panel: Recent Labs  Lab  12/18/21 1203 12/19/21 0309 12/19/21 0654 12/19/21 1329  NA 136 138  --   --   K 6.0* 6.7* 5.5* 5.0  CL 102 104  --   --   CO2 20* 22  --   --   GLUCOSE 101* 115*  --   --   BUN 47* 51*  --   --   CREATININE 12.69* 13.20*  --   --   CALCIUM 8.8* 8.5*  --   --   PHOS  --  7.4*  --   --    Liver Function Tests: Recent Labs  Lab 12/19/21 0309  ALBUMIN 3.2*   No results for input(s): LIPASE, AMYLASE in the last 168 hours. No results for input(s): AMMONIA in the last 168 hours. CBC: Recent Labs  Lab 12/18/21 1203 12/19/21 0654  WBC 6.8 7.3  HGB 9.9* 9.7*  HCT 34.9* 31.6*  MCV 107.1* 102.3*  PLT PLATELET CLUMPS NOTED ON SMEAR, UNABLE TO ESTIMATE 181   Cardiac Enzymes: No results for input(s): CKTOTAL, CKMB, CKMBINDEX, TROPONINI in the last 168 hours. BNP: Invalid input(s): POCBNP CBG: Recent Labs  Lab 12/18/21 2201 12/19/21 0531  GLUCAP 151* 140*   D-Dimer No results for input(s): DDIMER in the last 72 hours. Hgb A1c Recent Labs    12/19/21 0654  HGBA1C 5.6   Lipid Profile No results for input(s): CHOL, HDL, LDLCALC, TRIG, CHOLHDL, LDLDIRECT in the last 72 hours. Thyroid function studies No results for input(s): TSH, T4TOTAL, T3FREE, THYROIDAB in the last 72 hours.  Invalid input(s): FREET3 Anemia work up No results for input(s): VITAMINB12, FOLATE, FERRITIN, TIBC, IRON, RETICCTPCT in the last 72 hours. Urinalysis    Component Value Date/Time   COLORURINE YELLOW 10/10/2019 0754   APPEARANCEUR TURBID (A) 10/10/2019 0754   LABSPEC 1.014 10/10/2019 0754   PHURINE 5.0 10/10/2019 0754   GLUCOSEU NEGATIVE 10/10/2019 0754   HGBUR MODERATE (A) 10/10/2019 0754   BILIRUBINUR Positive 02/01/2020 1507   BILIRUBINUR neg 08/08/2013 0000   KETONESUR NEGATIVE 10/10/2019 0754   PROTEINUR Positive (A) 02/01/2020 1507   PROTEINUR >=300 (A) 10/10/2019 0754   UROBILINOGEN 0.2 02/01/2020 1507   UROBILINOGEN 0.2 09/07/2015 1230   NITRITE Negative 02/01/2020 1507    NITRITE NEGATIVE 10/10/2019 0754   LEUKOCYTESUR Trace (A) 02/01/2020 1507   LEUKOCYTESUR LARGE (A) 10/10/2019 0754    Microbiology Recent Results (from the past 240 hour(s))  Resp Panel by RT-PCR (Flu A&B, Covid) Nasopharyngeal Swab     Status: None   Collection Time: 12/18/21  6:42 PM   Specimen: Nasopharyngeal Swab; Nasopharyngeal(NP) swabs in vial transport medium  Result Value Ref Range Status   SARS Coronavirus 2 by RT PCR NEGATIVE NEGATIVE Final    Comment: (NOTE) SARS-CoV-2 target nucleic acids are NOT DETECTED.  The SARS-CoV-2 RNA is generally detectable in upper respiratory specimens during the acute phase of infection. The lowest concentration of SARS-CoV-2 viral copies this assay can detect is 138 copies/mL. A negative result does not preclude SARS-Cov-2 infection and should not be used as the sole basis for treatment or other patient management decisions. A negative result may occur with  improper specimen collection/handling, submission  of specimen other than nasopharyngeal swab, presence of viral mutation(s) within the areas targeted by this assay, and inadequate number of viral copies(<138 copies/mL). A negative result must be combined with clinical observations, patient history, and epidemiological information. The expected result is Negative.  Fact Sheet for Patients:  EntrepreneurPulse.com.au  Fact Sheet for Healthcare Providers:  IncredibleEmployment.be  This test is no t yet approved or cleared by the [place] FDA and  has been authorized for detection and/or diagnosis of SARS-CoV-2 by FDA under an Emergency Use Authorization (EUA). This EUA will remain  in effect (meaning this test can be used) for the duration of the COVID-19 declaration under Section 564(b)(1) of the Act, 21 U.S.C.section 360bbb-3(b)(1), unless the authorization is terminated  or revoked sooner.       Influenza A by PCR NEGATIVE NEGATIVE Final    Influenza B by PCR NEGATIVE NEGATIVE Final    Comment: (NOTE) The Xpert Xpress SARS-CoV-2/FLU/RSV plus assay is intended as an aid in the diagnosis of influenza from Nasopharyngeal swab specimens and should not be used as a sole basis for treatment. Nasal washings and aspirates are unacceptable for Xpert Xpress SARS-CoV-2/FLU/RSV testing.  Fact Sheet for Patients: EntrepreneurPulse.com.au  Fact Sheet for Healthcare Providers: IncredibleEmployment.be  This test is not yet approved or cleared by the [place] FDA and has been authorized for detection and/or diagnosis of SARS-CoV-2 by FDA under an Emergency Use Authorization (EUA). This EUA will remain in effect (meaning this test can be used) for the duration of the COVID-19 declaration under Section 564(b)(1) of the Act, 21 U.S.C. section 360bbb-3(b)(1), unless the authorization is terminated or revoked.  Performed at [facility], [place] [address], [place], [place] [address]     Time coordinating discharge: Approximately 40 minutes  Patrecia Pour, MD  [facility] 12/19/2021, 2:52 PM

## 2021-12-19 NOTE — Progress Notes (Signed)
Critical result reported:  Date and time results received: 12/19/21 at 04:00 am.  Test: BMP  Critical Value: K 6.7 mmol/L  Name of Provider Notified: Dr. Sheran Luz  Order received for: 50% dextrose 50 ml,  Sodium Bicarbonate 50 mEq,  Insulin Novolog 5 units IV,  Albuteral NB 10 mg. EKG 12 leads stat- result is normal sinus rhythm with low voltage QRS and nonspecific T wave abnormality.  We will repeat potassium level every 4 hrs.  Kennyth Lose, RN

## 2021-12-19 NOTE — Procedures (Signed)
Pre-procedure Diagnosis: ESRD Post-procedure Diagnosis: Same  Successful placement of tunneled HD catheter with tips terminating within the superior aspect of the right atrium.    Complications: None Immediate  EBL: Minimal   The catheter is ready for immediate use.   Jay Kamarri Lovvorn, MD Pager #: [phone]   

## 2021-12-22 ENCOUNTER — Encounter (HOSPITAL_COMMUNITY): Payer: Self-pay

## 2021-12-25 LAB — HEPATITIS B SURFACE ANTIBODY, QUANTITATIVE: Hep B S AB Quant (Post): 893.2 m[IU]/mL (ref >9.9)

## 2021-12-28 ENCOUNTER — Other Ambulatory Visit: Payer: Self-pay

## 2021-12-28 ENCOUNTER — Emergency Department (HOSPITAL_COMMUNITY): Payer: Medicare Other

## 2021-12-28 ENCOUNTER — Encounter (HOSPITAL_COMMUNITY): Payer: Self-pay | Admitting: Emergency Medicine

## 2021-12-28 ENCOUNTER — Observation Stay (HOSPITAL_COMMUNITY)
Admission: EM | Admit: 2021-12-28 | Discharge: 2021-12-30 | Disposition: A | Discharge status: Home / Self Care | Payer: Medicare Other | Attending: Family Medicine | Admitting: Family Medicine

## 2021-12-28 DIAGNOSIS — R0602 Shortness of breath: Secondary | ICD-10-CM | POA: Diagnosis not present

## 2021-12-28 DIAGNOSIS — Z20822 Contact with and (suspected) exposure to covid-19: Secondary | ICD-10-CM | POA: Insufficient documentation

## 2021-12-28 DIAGNOSIS — Z794 Long term (current) use of insulin: Secondary | ICD-10-CM | POA: Insufficient documentation

## 2021-12-28 DIAGNOSIS — D631 Anemia in chronic kidney disease: Secondary | ICD-10-CM | POA: Insufficient documentation

## 2021-12-28 DIAGNOSIS — Z992 Dependence on renal dialysis: Secondary | ICD-10-CM | POA: Diagnosis not present

## 2021-12-28 DIAGNOSIS — E1122 Type 2 diabetes mellitus with diabetic chronic kidney disease: Secondary | ICD-10-CM | POA: Insufficient documentation

## 2021-12-28 DIAGNOSIS — K3184 Gastroparesis: Secondary | ICD-10-CM | POA: Diagnosis not present

## 2021-12-28 DIAGNOSIS — N186 End stage renal disease: Secondary | ICD-10-CM | POA: Insufficient documentation

## 2021-12-28 DIAGNOSIS — E1159 Type 2 diabetes mellitus with other circulatory complications: Secondary | ICD-10-CM | POA: Diagnosis present

## 2021-12-28 DIAGNOSIS — T8249XA Other complication of vascular dialysis catheter, initial encounter: Principal | ICD-10-CM | POA: Insufficient documentation

## 2021-12-28 DIAGNOSIS — E11319 Type 2 diabetes mellitus with unspecified diabetic retinopathy without macular edema: Secondary | ICD-10-CM | POA: Insufficient documentation

## 2021-12-28 DIAGNOSIS — E1140 Type 2 diabetes mellitus with diabetic neuropathy, unspecified: Secondary | ICD-10-CM | POA: Diagnosis not present

## 2021-12-28 DIAGNOSIS — T829XXA Unspecified complication of cardiac and vascular prosthetic device, implant and graft, initial encounter: Secondary | ICD-10-CM

## 2021-12-28 DIAGNOSIS — E875 Hyperkalemia: Secondary | ICD-10-CM | POA: Diagnosis present

## 2021-12-28 DIAGNOSIS — E119 Type 2 diabetes mellitus without complications: Secondary | ICD-10-CM

## 2021-12-28 DIAGNOSIS — T82598A Other mechanical complication of other cardiac and vascular devices and implants, initial encounter: Secondary | ICD-10-CM | POA: Diagnosis present

## 2021-12-28 DIAGNOSIS — E1143 Type 2 diabetes mellitus with diabetic autonomic (poly)neuropathy: Secondary | ICD-10-CM | POA: Insufficient documentation

## 2021-12-28 DIAGNOSIS — I120 Hypertensive chronic kidney disease with stage 5 chronic kidney disease or end stage renal disease: Secondary | ICD-10-CM | POA: Diagnosis not present

## 2021-12-28 DIAGNOSIS — I517 Cardiomegaly: Secondary | ICD-10-CM | POA: Insufficient documentation

## 2021-12-28 DIAGNOSIS — Z89511 Acquired absence of right leg below knee: Secondary | ICD-10-CM | POA: Insufficient documentation

## 2021-12-28 DIAGNOSIS — I152 Hypertension secondary to endocrine disorders: Secondary | ICD-10-CM | POA: Diagnosis present

## 2021-12-28 DIAGNOSIS — Y841 Kidney dialysis as the cause of abnormal reaction of the patient, or of later complication, without mention of misadventure at the time of the procedure: Secondary | ICD-10-CM | POA: Diagnosis not present

## 2021-12-28 LAB — COMPREHENSIVE METABOLIC PANEL
ALT: 11 U/L (ref 0–44)
AST: 20 U/L (ref 15–41)
Albumin: 3.4 g/dL — ABNORMAL LOW (ref 3.5–5.0)
Alkaline Phosphatase: 71 U/L (ref 38–126)
Anion gap: 14 (ref 5–15)
BUN: 54 mg/dL — ABNORMAL HIGH (ref 6–20)
CO2: 24 mmol/L (ref 22–32)
Calcium: 9.2 mg/dL (ref 8.9–10.3)
Chloride: 101 mmol/L (ref 98–111)
Creatinine, Ser: 13.73 mg/dL — ABNORMAL HIGH (ref 0.44–1.00)
GFR, Estimated: 3 mL/min — ABNORMAL LOW (ref >60)
Glucose, Bld: 123 mg/dL — ABNORMAL HIGH (ref 70–99)
Potassium: 5.6 mmol/L — ABNORMAL HIGH (ref 3.5–5.1)
Sodium: 139 mmol/L (ref 135–145)
Total Bilirubin: 0.9 mg/dL (ref 0.3–1.2)
Total Protein: 7.7 g/dL (ref 6.5–8.1)

## 2021-12-28 LAB — RESP PANEL BY RT-PCR (FLU A&B, COVID) ARPGX2
Influenza A by PCR: NEGATIVE
Influenza B by PCR: NEGATIVE
SARS Coronavirus 2 by RT PCR: NEGATIVE

## 2021-12-28 LAB — CBC WITH DIFFERENTIAL/PLATELET
Abs Immature Granulocytes: 0.03 10*3/uL (ref 0.00–0.07)
Basophils Absolute: 0 10*3/uL (ref 0.0–0.1)
Basophils Relative: 0 %
Eosinophils Absolute: 0.6 10*3/uL — ABNORMAL HIGH (ref 0.0–0.5)
Eosinophils Relative: 11 %
HCT: 35.5 % — ABNORMAL LOW (ref 36.0–46.0)
Hemoglobin: 10.7 g/dL — ABNORMAL LOW (ref 12.0–15.0)
Immature Granulocytes: 1 %
Lymphocytes Relative: 27 %
Lymphs Abs: 1.5 10*3/uL (ref 0.7–4.0)
MCH: 31.2 pg (ref 26.0–34.0)
MCHC: 30.1 g/dL (ref 30.0–36.0)
MCV: 103.5 fL — ABNORMAL HIGH (ref 80.0–100.0)
Monocytes Absolute: 0.4 10*3/uL (ref 0.1–1.0)
Monocytes Relative: 7 %
Neutro Abs: 3 10*3/uL (ref 1.7–7.7)
Neutrophils Relative %: 54 %
Platelets: 248 10*3/uL (ref 150–400)
RBC: 3.43 MIL/uL — ABNORMAL LOW (ref 3.87–5.11)
RDW: 16.4 % — ABNORMAL HIGH (ref 11.5–15.5)
WBC: 5.5 10*3/uL (ref 4.0–10.5)
nRBC: 1.5 % — ABNORMAL HIGH (ref 0.0–0.2)

## 2021-12-28 LAB — GLUCOSE, CAPILLARY: Glucose-Capillary: 155 mg/dL — ABNORMAL HIGH (ref 70–99)

## 2021-12-28 MED ORDER — SODIUM CHLORIDE 0.9% FLUSH: 3.0000 mL | INTRAVENOUS | Status: DC | PRN

## 2021-12-28 MED ORDER — SODIUM CHLORIDE 0.9% FLUSH
3.0000 mL | Freq: Two times a day (BID) | INTRAVENOUS | Status: DC
  Administered 2021-12-29 – 2021-12-30 (×3): 3 mL via INTRAVENOUS

## 2021-12-28 MED ORDER — ONDANSETRON HCL 4 MG/2ML IJ SOLN: 4.0000 mg | Freq: Four times a day (QID) | INTRAMUSCULAR | Status: DC | PRN

## 2021-12-28 MED ORDER — SODIUM ZIRCONIUM CYCLOSILICATE 10 G PO PACK
10.0000 g | PACK | Freq: Once | ORAL | Status: AC
  Administered 2021-12-28: 10 g via ORAL
  Filled 2021-12-28: qty 1

## 2021-12-28 MED ORDER — ACETAMINOPHEN 325 MG PO TABS
650.0000 mg | ORAL_TABLET | Freq: Four times a day (QID) | ORAL | Status: DC | PRN
  Administered 2021-12-29: 650 mg via ORAL
  Filled 2021-12-28: qty 2

## 2021-12-28 MED ORDER — ACETAMINOPHEN 650 MG RE SUPP: 650.0000 mg | Freq: Four times a day (QID) | RECTAL | Status: DC | PRN

## 2021-12-28 MED ORDER — INSULIN GLARGINE-YFGN 100 UNIT/ML ~~LOC~~ SOLN
26.0000 [IU] | Freq: Every day | SUBCUTANEOUS | Status: DC
  Administered 2021-12-29: 26 [IU] via SUBCUTANEOUS
  Filled 2021-12-28 (×4): qty 0.26

## 2021-12-28 MED ORDER — HEPARIN SODIUM (PORCINE) 5000 UNIT/ML IJ SOLN
5000.0000 [IU] | Freq: Three times a day (TID) | INTRAMUSCULAR | Status: DC
  Administered 2021-12-28 – 2021-12-30 (×4): 5000 [IU] via SUBCUTANEOUS
  Filled 2021-12-28 (×4): qty 1

## 2021-12-28 MED ORDER — TRAZODONE HCL 50 MG PO TABS
50.0000 mg | ORAL_TABLET | Freq: Every evening | ORAL | Status: DC | PRN
  Administered 2021-12-28 – 2021-12-29 (×2): 50 mg via ORAL
  Filled 2021-12-28 (×2): qty 1

## 2021-12-28 MED ORDER — CALCIUM ACETATE (PHOS BINDER) 667 MG PO CAPS
667.0000 mg | ORAL_CAPSULE | Freq: Three times a day (TID) | ORAL | Status: DC
  Administered 2021-12-29 – 2021-12-30 (×2): 667 mg via ORAL
  Filled 2021-12-28 (×2): qty 1

## 2021-12-28 MED ORDER — ONDANSETRON HCL 4 MG PO TABS: 4.0000 mg | ORAL_TABLET | Freq: Four times a day (QID) | ORAL | Status: DC | PRN

## 2021-12-28 MED ORDER — SENNOSIDES-DOCUSATE SODIUM 8.6-50 MG PO TABS: 1.0000 | ORAL_TABLET | Freq: Every evening | ORAL | Status: DC | PRN

## 2021-12-28 MED ORDER — SODIUM CHLORIDE 0.9 % IV SOLN: 250.0000 mL | INTRAVENOUS | Status: DC | PRN

## 2021-12-28 MED ORDER — ALTEPLASE 2 MG IJ SOLR
INTRAMUSCULAR | Status: AC
  Administered 2021-12-28: 2 mg
  Filled 2021-12-28: qty 2

## 2021-12-28 MED ORDER — LABETALOL HCL 200 MG PO TABS
100.0000 mg | ORAL_TABLET | Freq: Once | ORAL | Status: AC
  Administered 2021-12-28: 100 mg via ORAL
  Filled 2021-12-28: qty 1

## 2021-12-28 NOTE — Progress Notes (Incomplete)
New Admission Note:   Arrival Method: Arrived from [facility] ED via stretcher Mental Orientation: Alert and oriented x4 Telemetry: N/A Assessment: Completed Skin: Intact IV: No PIV Pain: 0/10 Tubes: N/A Safety Measures: Safety Fall Prevention Plan has been discussed.  Admission: Completed 5MW Orientation: Patient has been oriented to the room, unit and staff.  Family: None at bedside  Orders have been reviewed and implemented. Will continue to monitor the patient. Call light has been placed within reach and bed alarm has been activated.   Jordon Kristiansen [employer], RN-BC Phone number: [phone]

## 2021-12-28 NOTE — ED Provider Notes (Signed)
[facility] EMERGENCY DEPARTMENT Provider Note   CSN: [id number] Arrival date & time: 12/28/21  1058     History  Chief Complaint  Patient presents with   Vascular Access Problem    Heather [name] is a 51 y.o. female.  51 yo F with a chief complaints of her dialysis catheter not working.  The patient has been unable to get dialysis on Saturday or last Thursday.  She feels a little bit swollen but denies any difficulty breathing.  Otherwise has been well.  She went to dialysis and they told her she needed to come to the ER to be evaluated by IR to see why her catheter is not working.  She tells me that the red port works fine but the blue port is not working at all.  It does not have an AV fistula.  Plans to have that placed this week.  The history is provided by the patient and the spouse.  Illness Severity:  Moderate Onset quality:  Gradual Duration:  2 days Timing:  Constant Progression:  Unchanged Chronicity:  New Associated symptoms: no chest pain, no congestion, no fever, no headaches, no myalgias, no nausea, no rhinorrhea, no shortness of breath, no vomiting and no wheezing       Home Medications Prior to Admission medications   Medication Sig Start Date End Date Taking? Authorizing Provider  calcium acetate (PHOSLO) 667 MG capsule Take 667 mg by mouth 3 (three) times daily with meals. 08/25/21   [provider]  Continuous Blood Gluc Sensor (FREESTYLE LIBRE 14 DAY SENSOR) MISC 1 patch by Does not apply route every 14 (fourteen) days. 07/10/21   [name], MD  gabapentin (NEURONTIN) 300 MG capsule Take 1 capsule (300 mg total) by mouth 3 (three) times a week. Take after dialysis. Do not take more than three times weekly. 12/21/21   Patrecia Pour, MD  insulin aspart (NOVOLOG FLEXPEN) 100 UNIT/ML FlexPen Inject 6 Units into the skin 3 (three) times daily with meals. Sliding scale 07/16/20   [name], MD  insulin degludec (TRESIBA  FLEXTOUCH) 100 UNIT/ML SOPN FlexTouch Pen Inject 0.26 mLs (26 Units total) into the skin at bedtime. 12/26/19   Shamleffer, Melanie Crazier, MD  Insulin Pen Needle 29G X 5MM MISC 1 Device by Does not apply route 4 (four) times daily. 12/26/19   Shamleffer, Melanie Crazier, MD  midodrine (PROAMATINE) 5 MG tablet Take 1 tablet (5 mg total) by mouth See admin instructions. Given at Dialysis if needed 09/17/21   Collene Gobble, MD      Allergies    Ibuprofen, Dilaudid [hydromorphone hcl], Iodinated contrast media, and Tramadol    Review of Systems   Review of Systems  Constitutional:  Negative for chills and fever.  HENT:  Negative for congestion and rhinorrhea.   Eyes:  Negative for redness and visual disturbance.  Respiratory:  Negative for shortness of breath and wheezing.   Cardiovascular:  Negative for chest pain and palpitations.  Gastrointestinal:  Negative for nausea and vomiting.  Genitourinary:  Negative for dysuria and urgency.  Musculoskeletal:  Negative for arthralgias and myalgias.  Skin:  Negative for pallor and wound.  Neurological:  Negative for dizziness and headaches.   Physical Exam Updated Vital Signs BP (!) 212/99 (BP Location: Right Arm)    Pulse 80    Temp 98 F (36.7 C)    Resp 16    Ht 5\' 8"  (1.727 m)    Wt  99.5 kg    LMP  (LMP Unknown)    SpO2 97%    BMI 33.35 kg/m  Physical Exam Vitals and nursing note reviewed.  Constitutional:      General: [name] not in acute distress.    Appearance: [name] well-developed. [name] not diaphoretic.  HENT:     Head: Normocephalic and atraumatic.  Eyes:     Pupils: Pupils are equal, round, and reactive to light.  Cardiovascular:     Rate and Rhythm: Normal rate and regular rhythm.     Heart sounds: No murmur heard.   No friction rub. No gallop.  Pulmonary:     Effort: Pulmonary effort is normal.     Breath sounds: No wheezing or rales.  Abdominal:     General: There is no distension.     Palpations: Abdomen is soft.      Tenderness: There is no abdominal tenderness.  Musculoskeletal:        General: No tenderness.     Cervical back: Normal range of motion and neck supple.     Comments: Left-sided tunneled dialysis catheter without erythema drainage tenderness.  Skin:    General: Skin is warm and dry.  Neurological:     Mental Status: [name] alert and oriented to person, place, and time.  Psychiatric:        Behavior: Behavior normal.    ED Results / Procedures / Treatments   Labs (all labs ordered are listed, but only abnormal results are displayed) Labs Reviewed  COMPREHENSIVE METABOLIC PANEL - Abnormal; Notable for the following components:      Result Value   Potassium 5.6 (*)    Glucose, Bld 123 (*)    BUN 54 (*)    Creatinine, Ser 13.73 (*)    Albumin 3.4 (*)    GFR, Estimated 3 (*)    All other components within normal limits  CBC WITH DIFFERENTIAL/PLATELET - Abnormal; Notable for the following components:   RBC 3.43 (*)    Hemoglobin 10.7 (*)    HCT 35.5 (*)    MCV 103.5 (*)    RDW 16.4 (*)    nRBC 1.5 (*)    Eosinophils Absolute 0.6 (*)    All other components within normal limits  RESP PANEL BY RT-PCR (FLU A&B, COVID) [id number]    EKG EKG Interpretation  Date/Time:  Monday December 28 2021 11:55:52 EST Ventricular Rate:  82 PR Interval:  138 QRS Duration: 74 QT Interval:  386 QTC Calculation: 450 R Axis:   65 Text Interpretation: Normal sinus rhythm Low voltage QRS Borderline ECG When compared with ECG of 19-Dec-2021 04:34, PREVIOUS ECG IS PRESENT No significant change since last tracing Confirmed by Deno Etienne [phone]) on 12/28/2021 4:21:24 PM  Radiology DG Chest 1 View  Result Date: 12/28/2021 CLINICAL DATA:  Shortness of breath EXAM: CHEST  1 VIEW COMPARISON:  12/18/2021 FINDINGS: Transverse diameter of heart is increased. There are no signs of pulmonary edema or focal pulmonary consolidation. There is no significant pleural effusion or pneumothorax. Left hemidiaphragm is  elevated. There is interval repositioning or replacement of left jugular dialysis catheter with its tip in the region of superior vena cava close to the right atrium. IMPRESSION: Cardiomegaly. There are no signs of pulmonary edema or new focal infiltrates. Electronically Signed   By: Elmer Picker M.D.   On: 12/28/2021 11:48    Procedures Procedures    Medications Ordered in ED Medications  sodium zirconium cyclosilicate (LOKELMA) packet  10 g (10 g Oral Given 12/28/21 1837)  alteplase (CATHFLO ACTIVASE) 2 MG injection (2 mg  Given 12/28/21 1906)    ED Course/ Medical Decision Making/ A&P                           Medical Decision Making  51 yo F with a chief complaints of not being able to have dialysis performed.  She has been having difficulties with her tunneled dialysis catheter that was placed by IR about Christmas Day.  She has not been able to get dialysis the last 2 dialysis sessions.  Not overtly fluid overloaded on exam.  EKG without concerning finding.  No significant hyperkalemia.  Potassium of 5.6.  Chest x-ray viewed by me with no displacement of her tunneled dialysis catheter.  No pneumothorax.  Will discuss with nephrology.  Discussed with Dr. Marval Regal, nephrology recommends having a dialysis nurse come down and troubleshoot her catheter.  Unfortunately they were unable to get the catheter to work at bedside.  Recommended calling IR to have this replaced.  Recommended medical admission while awaiting IR replacement.  Plan to dialyze the patient tomorrow.  Recommended making her n.p.o. at midnight.  I discussed the case with IR, will plan to perform procedure tomorrow.   CRITICAL CARE Performed by: Cecilio Asper   Total critical care time: 35 minutes  Critical care time was exclusive of separately billable procedures and treating other patients.  Critical care was necessary to treat or prevent imminent or life-threatening deterioration.  Critical care  was time spent personally by me on the following activities: development of treatment plan with patient and/or surrogate as well as nursing, discussions with consultants, evaluation of patient's response to treatment, examination of patient, obtaining history from patient or surrogate, ordering and performing treatments and interventions, ordering and review of laboratory studies, ordering and review of radiographic studies, pulse oximetry and re-evaluation of patient's condition.  The patients results and plan were reviewed and discussed.   Any x-rays performed were independently reviewed by myself.   Differential diagnosis were considered with the presenting HPI.  Medications  sodium zirconium cyclosilicate (LOKELMA) packet 10 g (10 g Oral Given 12/28/21 1837)  alteplase (CATHFLO ACTIVASE) 2 MG injection (2 mg  Given 12/28/21 1906)    Vitals:   12/28/21 1108 12/28/21 1108 12/28/21 1406 12/28/21 1759  BP:  (!) 202/94 (!) 201/95 (!) 212/99  Pulse:  81 81 80  Resp:  16 14 16   Temp:  98 F (36.7 C) 98 F (36.7 C)   TempSrc:  Oral    SpO2:  96% 97% 97%  Weight: 99.5 kg     Height: 5\' 8"  (1.727 m)       Final diagnoses:  Complication of vascular access for dialysis, initial encounter    Admission/ observation were discussed with the admitting physician, patient and/or family and they are comfortable with the plan.          Final Clinical Impression(s) / ED Diagnoses Final diagnoses:  Complication of vascular access for dialysis, initial encounter    Rx / DC Orders ED Discharge Orders     None         Deno Etienne, DO 12/28/21 1943

## 2021-12-28 NOTE — ED Provider Notes (Signed)
Emergency Medicine Provider Triage Evaluation Note  Heather [name] , a 51 y.o. female  was evaluated in triage.  Pt complains of malfunction of her dialysis catheter.  She reports that the last 2 dialysis sessions, last Tuesday and Thursday, her dialysis center was unable to give her a full treatment.  Today they told her that she should come to the ER to get in touch with IR.  Reports that she feels as though there is fluid on her however no outright shortness of breath.  No longer makes urine.  Review of Systems  As above  Physical Exam  BP (!) 202/94    Pulse 81    Temp 98 F (36.7 C) (Oral)    Resp 16    Ht 5\' 8"  (1.727 m)    Wt 99.5 kg    LMP  (LMP Unknown)    SpO2 96%    BMI 33.35 kg/m  Gen:   Awake, no distress   Resp:  Normal effort  MSK:   Moves extremities without difficulty  Other:  Catheter in left anterior chest, no signs of cellulitis or infection.  Covered by clean dressing.  Medical Decision Making  Medically screening exam initiated at 11:29 AM.  Appropriate orders placed.  Heather [name] was informed that the remainder of the evaluation will be completed by another provider, this initial triage assessment does not replace that evaluation, and the importance of remaining in the ED until their evaluation is complete.     Heather [name] 12/28/21 1130    Godfrey Pick, MD 12/30/21 [phone]

## 2021-12-28 NOTE — ED Triage Notes (Signed)
Patient coming from home, complaint of dialysis catheter clogged. Patient states she has been to her last two dialysis appointments and the catheter is clogged both times. Pt does dialysis T/TH/Sat.

## 2021-12-28 NOTE — H&P (Signed)
History and Physical    Heather [name] VEL:[id number] DOB: 09-Jan-1971 DOA: 12/28/2021  PCP: [name], MD   Patient coming from: Home  Chief Complaint: Dialysis catheter not working  HPI: Heather [name] is a 51 y.o. female with medical history significant for ESRD on TTS HD via HD cath, history of CVA, insulin-dependent T2DM, HTN, Charcot foot s/p right BKA, history of DVT not on anticoagulation, anemia of chronic kidney disease who presented to the ED for evaluation of nonfunctioning HD catheter. She missed her last two sessions of HD on Thursday and Saturday due to the malfunctioning HD.  She reports she has mild swelling but has no difficulty breathing.  She reports she has fatigue and is more tired than normal but otherwise has no complaints.  She denies any chest pain, pressure, palpitations, abdominal pain nausea vomiting diarrhea.  She does make small amount of urine intermittently which is unchanged.  She has not had any fevers or illness.  She went to dialysis yesterday but the catheter was not working properly and she was told to come to the hospital for evaluation by IR replacement of catheter.  She reports her blood sugars have been stable in the 130-140 range.  Patient had similar episode of malfunctioning HD catheter last week and had catheter replaced.  She does not know of any trauma to the catheter since this was placed.  She reports she is on any antihypertensive medication at this time as she was having low blood pressure and was using midodrine as needed if blood pressure was low.  ED Course:  Vital signs show BP of 200-212/90-99. HR-80's, RR-14-18, afebrile.  Chest x-ray shows no infiltrate or consolidation.  No pulmonary edema.  Lab work shows WBC 5500 hemoglobin 10.7 hematocrit 35.5 platelets 248,000 sodium 139 potassium 5.6 chloride 101 bicarb 24 creatinine 13.73 which is baseline BUN 54 glucose 123 alkaline phosphatase 71 AST 20 ALT 11 calcium 9.2.  Hospital  service asked to observe patient overnight keep n.p.o. after midnight in anticipation of IR procedure to replace catheter in the morning.  IR and nephrology have  been consulted by ER physician  Review of Systems:  General: Reports fatigue. Denies fever, chills, weight loss, night sweats. Denies change in appetite HENT: Denies head trauma, headache, denies change in hearing. Denies nasal congestion or bleeding.  Denies sore throat.  Eyes: Denies blurry vision, pain in eye, drainage.  Denies discoloration of eyes. Neck: Denies pain.  Denies swelling.  Denies pain with movement. Cardiovascular: Denies chest pain, palpitations.  Denies orthopnea Respiratory: Denies shortness of breath, cough. Denies wheezing.  Denies sputum production Gastrointestinal: Denies abdominal pain, swelling. Denies nausea, vomiting, diarrhea.  Denies melena.  Denies hematemesis. Musculoskeletal: Denies limitation of movement. Denies arthralgias or myalgias. Genitourinary: Denies pelvic pain.  Denies urinary frequency or hesitancy.  Denies dysuria.  Skin: Denies rash.  Denies petechiae, purpura, ecchymosis. Neurological: Denies syncope. Denies paresthesia. Denies slurred speech, drooping face. Denies visual change. Psychiatric: Denies depression, anxiety. Denies hallucinations.  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[place]), nonhemorrhagic, inferior right cerebellum 12/03/2017   left side weakness in leg   Cyst of left ovary 01/12/2018   Diabetic neuropathy, painful ([place]), on low dose Gabapentin 07/13/2013   DM (diabetes mellitus), type 2, uncontrolled, with renal complications 25/85/2778  DM2 (diabetes mellitus, type 2) ([place])    Dysfunction of left eustachian tube, with pusatile tinnitus 11/24/2017   Dyslipidemia  associated with type 2 diabetes mellitus ([place]) 06/25/2016   ESRD (end stage renal disease) on dialysis [facility])    On hemodialysis in July 2019 via [facility] then switched to [facility] in Nov 2019.    ESRD with anemia (HCC)    Fibromyalgia    Gastroparesis due to DM    GERD (gastroesophageal reflux disease)    Headache    Hypertension associated with diabetes ([place]) 02/19/2011   IBS (irritable bowel syndrome)    Left-sided weakness 12/10/2017   .   Leiomyoma of uterus    Pancreatitis    PE (pulmonary thromboembolism) ([place]) 11/04/2019   Pneumonia    Proliferative diabetic retinopathy ([place]) 09/29/2015   Pronation deformity of both feet 06/14/2014   Reactive depression 12/10/2017   .   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[place]) 02/17/2018   Thromboembolism ([place]) 04/05/2018   Upper airway cough syndrome, with recs to stay off ACE and take Pepcid q hs 11/15/2016    Past Surgical History:  Procedure Laterality Date   A/V FISTULAGRAM Right 03/03/2020   Procedure: Venogram;  Surgeon: Waynetta Sandy, MD;  Location: [facility];  Service: Cardiovascular;  Laterality: Right;   ARTERY REPAIR Right 02/17/2018   Procedure: EXPLORATION OF RIGHT BRACHIAL ARTERY;  Surgeon: Conrad [place], MD;  Location: [place];  Service: Vascular;  Laterality: Right;   ARTERY REPAIR Right 02/17/2018   Procedure: BRACHIAL ARTERY EXPLORATION AND TRHOMBECTOMY;  Surgeon: Conrad [place], MD;  Location: [place];  Service: Vascular;  Laterality: Right;   AV FISTULA PLACEMENT Left 05/09/2017   Procedure: INSERTION OF ARTERIOVENOUS (AV) GRAFT ARM (ARTEGRAFT);  Surgeon: Conrad Renova, MD;  Location: [facility] OR;  Service: Vascular;  Laterality: Left;   AV FISTULA PLACEMENT Right 02/17/2018   Procedure: INSERTION OF ARTERIOVENOUS (AV) GORE-TEX GRAFT ARM RIGHT UPPER ARM;  Surgeon: Conrad [place], MD;  Location: [place];  Service: Vascular;   Laterality: Right;   AV FISTULA PLACEMENT Left 10/10/2019   Procedure: INSERTION OF LEFT ARTERIOVENOUS (AV) ARTEGRAFT GRAFT ARM;  Surgeon: Waynetta Sandy, MD;  Location: [place];  Service: Vascular;  Laterality: Left;   AV FISTULA PLACEMENT Right 03/19/2020   Procedure: INSERTION OF ARTERIOVENOUS (AV) GORE-TEX GRAFT IN RIGHT ARM AND VIABAHN STENT IN RIGHT AXILLARY ARTERY;  Surgeon: Waynetta Sandy, MD;  Location: [place];  Service: Vascular;  Laterality: Right;   [place] Left 08/31/2016   Procedure: BASILIC VEIN TRANSPOSITION FIRST STAGE;  Surgeon: Conrad Cleone, MD;  Location: [place];  Service: Vascular;  Laterality: Left;   BRONCHIAL WASHINGS  09/17/2021   Procedure: BRONCHIAL WASHINGS;  Surgeon: Collene Gobble, MD;  Location: [facility];  Service: Cardiopulmonary;;   CENTRAL VENOUS CATHETER INSERTION Left 07/24/2018   Procedure: INSERTION CENTRAL LINE ADULT;  Surgeon: Conrad Huntley, MD;  Location: [place];  Service: Vascular;  Laterality: Left;   EYE SURGERY     secondary to diabetic retinopathy    FOOT SURGERY Right    t"ook bone out- maybe hammer toe"   INSERTION OF DIALYSIS CATHETER Left 07/24/2018   Procedure: INSERTION OF TUNNELED DIALYSIS CATHETER;  Surgeon: Conrad Poplar, MD;  Location: [place];  Service: Vascular;  Laterality: Left;   IR FLUORO GUIDE CV LINE LEFT  12/19/2021  IR FLUORO GUIDE CV LINE RIGHT  07/21/2018   IR FLUORO GUIDE CV LINE RIGHT  06/01/2019   IR FLUORO GUIDE CV LINE RIGHT  05/05/2020   IR REMOVAL TUN CV CATH W/O FL  01/23/2021   IR REMOVAL TUN CV CATH W/O FL  12/19/2021   IR US GUIDE VASC ACCESS LEFT  12/19/2021   IR US GUIDE VASC ACCESS RIGHT  07/21/2018   IR US GUIDE VASC ACCESS RIGHT  06/01/2019   LIGATION ARTERIOVENOUS GORTEX GRAFT Right 03/20/2020   Procedure: LIGATION ARM ARTERIOVENOUS GORTEX GRAFT With Patch Angioplasty, Thrombectomy;  Surgeon: Waynetta Sandy, MD;  Location: [place];  Service: Vascular;  Laterality: Right;    LIGATION OF ARTERIOVENOUS  FISTULA Left 05/09/2017   Procedure: LIGATION OF ARTERIOVENOUS  FISTULA;  Surgeon: Conrad Lublin, MD;  Location: [place];  Service: Vascular;  Laterality: Left;   LOOP RECORDER INSERTION N/A 12/05/2017   Procedure: LOOP RECORDER INSERTION;  Surgeon: Evans Lance, MD;  Location: [place] [facility];  Service: Cardiovascular;  Laterality: N/A;   MYOMECTOMY     REMOVAL OF GRAFT Right 02/17/2018   Procedure: REMOVAL OF RIGHT UPPER ARM ARTERIOVENOUS GRAFT;  Surgeon: Conrad Cheshire, MD;  Location: [place];  Service: Vascular;  Laterality: Right;   REVISION OF ARTERIOVENOUS GORETEX GRAFT Left 07/24/2018   Procedure: REDO ARTERIOVENOUS GORETEX GRAFT;  Surgeon: Conrad Brice, MD;  Location: [place];  Service: Vascular;  Laterality: Left;   TEE WITHOUT CARDIOVERSION N/A 12/05/2017   Procedure: TRANSESOPHAGEAL ECHOCARDIOGRAM (TEE);  Surgeon: Sanda Klein, MD;  Location: [place];  Service: Cardiovascular;  Laterality: N/A;   UPPER EXTREMITY VENOGRAPHY Left 07/13/2018   Procedure: UPPER EXTREMITY VENOGRAPHY - Central & Left Arm;  Surgeon: Conrad [place], MD;  Location: [place] [facility];  Service: Cardiovascular;  Laterality: Left;   UPPER EXTREMITY VENOGRAPHY Bilateral 09/10/2019   Procedure: UPPER EXTREMITY VENOGRAPHY;  Surgeon: Waynetta Sandy, MD;  Location: [place] [facility];  Service: Cardiovascular;  Laterality: Bilateral;   UTERINE FIBROID SURGERY     VIDEO BRONCHOSCOPY N/A 09/17/2021   Procedure: VIDEO BRONCHOSCOPY WITHOUT FLUORO;  Surgeon: Collene Gobble, MD;  [facility];  Service: Cardiopulmonary;  Laterality: N/A;   VITRECTOMY Bilateral     Social History  reports that she has never smoked. She has never used smokeless tobacco. She reports that she does not drink alcohol and does not use drugs.  Allergies  Allergen Reactions   Ibuprofen Other (See Comments)    CKD stage 3. Should avoid.   Dilaudid [Hydromorphone Hcl] Itching and Other (See  Comments)    Can take with Benadryl.   Iodinated Contrast Media Rash   Tramadol Itching    Family History  Problem Relation Age of Onset   Colon polyps Mother    Diabetes Mother    Heart murmur Father        history essentially unknown   Hypertension Father    Diabetes Sister    Kidney failure Sister        kidney transplant   Diabetes Brother    Retinal degeneration Brother    Heart murmur Son    Stroke Paternal Grandmother    Diabetes Sister      Prior to Admission medications   Medication Sig Start Date End Date Taking? Authorizing Provider  calcium acetate (PHOSLO) 667 MG capsule Take 667 mg by mouth 3 (three) times daily with meals. 08/25/21   [provider]  Continuous Blood  Gluc Sensor (FREESTYLE LIBRE 14 DAY SENSOR) MISC 1 patch by Does not apply route every 14 (fourteen) days. 07/10/21   [name], MD  gabapentin (NEURONTIN) 300 MG capsule Take 1 capsule (300 mg total) by mouth 3 (three) times a week. Take after dialysis. Do not take more than three times weekly. 12/21/21   Patrecia Pour, MD  insulin aspart (NOVOLOG FLEXPEN) 100 UNIT/ML FlexPen Inject 6 Units into the skin 3 (three) times daily with meals. Sliding scale 07/16/20   [name], MD  insulin degludec (TRESIBA FLEXTOUCH) 100 UNIT/ML SOPN FlexTouch Pen Inject 0.26 mLs (26 Units total) into the skin at bedtime. 12/26/19   Shamleffer, Melanie Crazier, MD  Insulin Pen Needle 29G X 5MM MISC 1 Device by Does not apply route 4 (four) times daily. 12/26/19   Shamleffer, Melanie Crazier, MD  midodrine (PROAMATINE) 5 MG tablet Take 1 tablet (5 mg total) by mouth See admin instructions. Given at Dialysis if needed 09/17/21   Collene Gobble, MD    Physical Exam: Vitals:   12/28/21 1108 12/28/21 1108 12/28/21 1406 12/28/21 1759  BP:  (!) 202/94 (!) 201/95 (!) 212/99  Pulse:  81 81 80  Resp:  16 14 16   Temp:  98 F (36.7 C) 98 F (36.7 C)   TempSrc:  Oral    SpO2:  96% 97% 97%  Weight: 99.5 kg      Height: 5\' 8"  (1.727 m)       Constitutional: NAD, calm, comfortable Vitals:   12/28/21 1108 12/28/21 1108 12/28/21 1406 12/28/21 1759  BP:  (!) 202/94 (!) 201/95 (!) 212/99  Pulse:  81 81 80  Resp:  16 14 16   Temp:  98 F (36.7 C) 98 F (36.7 C)   TempSrc:  Oral    SpO2:  96% 97% 97%  Weight: 99.5 kg     Height: 5\' 8"  (1.727 m)      General: WDWN, Alert and oriented x3.  Eyes: EOMI, PERRL, conjunctivae normal.  Sclera nonicteric HENT:  [place]/AT, external ears normal.  Nares patent without epistasis.  Mucous membranes are moist.  Neck: Soft, normal range of motion, supple, Trachea midline Respiratory: clear to auscultation bilaterally, no wheezing, no crackles. Normal respiratory effort.  Cardiovascular: Regular rate and rhythm, no murmurs / rubs / gallops. Mild left leg edema. Abdomen: Soft, no tenderness, nondistended, no rebound or guarding. Bowel sounds normoactive Musculoskeletal: FROM. Right BKA. no cyanosis. Normal muscle tone. Left sided tunneled dialysis catheter without erythema or drainage. Skin: Warm, dry, intact no rashes Neurologic: CN 2-12 grossly intact.  Normal speech.  Psychiatric: Normal judgment and insight.  Normal mood.    Labs on Admission: I have personally reviewed following labs and imaging studies  CBC: Recent Labs  Lab 12/28/21 1146  WBC 5.5  NEUTROABS 3.0  HGB 10.7*  HCT 35.5*  MCV 103.5*  PLT 382    Basic Metabolic Panel: Recent Labs  Lab 12/28/21 1146  NA 139  K 5.6*  CL 101  CO2 24  GLUCOSE 123*  BUN 54*  CREATININE 13.73*  CALCIUM 9.2    GFR: Estimated Creatinine Clearance: 5.9 mL/min (A) (by C-G formula based on SCr of 13.73 mg/dL (H)).  Liver Function Tests: Recent Labs  Lab 12/28/21 1146  AST 20  ALT 11  ALKPHOS 71  BILITOT 0.9  PROT 7.7  ALBUMIN 3.4*    Urine analysis:    Component Value Date/Time   COLORURINE YELLOW 10/10/2019 0754  APPEARANCEUR TURBID (A) 10/10/2019 0754   LABSPEC 1.014 10/10/2019  0754   PHURINE 5.0 10/10/2019 0754   GLUCOSEU NEGATIVE 10/10/2019 0754   HGBUR MODERATE (A) 10/10/2019 0754   BILIRUBINUR Positive 02/01/2020 1507   BILIRUBINUR neg 08/08/2013 0000   KETONESUR NEGATIVE 10/10/2019 0754   PROTEINUR Positive (A) 02/01/2020 1507   PROTEINUR >=300 (A) 10/10/2019 0754   UROBILINOGEN 0.2 02/01/2020 1507   UROBILINOGEN 0.2 09/07/2015 1230   NITRITE Negative 02/01/2020 1507   NITRITE NEGATIVE 10/10/2019 0754   LEUKOCYTESUR Trace (A) 02/01/2020 1507   LEUKOCYTESUR LARGE (A) 10/10/2019 0754    Radiological Exams on Admission: DG Chest 1 View  Result Date: 12/28/2021 CLINICAL DATA:  Shortness of breath EXAM: CHEST  1 VIEW COMPARISON:  12/18/2021 FINDINGS: Transverse diameter of heart is increased. There are no signs of pulmonary edema or focal pulmonary consolidation. There is no significant pleural effusion or pneumothorax. Left hemidiaphragm is elevated. There is interval repositioning or replacement of left jugular dialysis catheter with its tip in the region of superior vena cava close to the right atrium. IMPRESSION: Cardiomegaly. There are no signs of pulmonary edema or new focal infiltrates. Electronically Signed   By: Elmer Picker M.D.   On: 12/28/2021 11:48    EKG: Independently reviewed. EKG shows NSR with no acute ST elevation or depression. Qtc 450  Assessment/Plan Principal Problem:   ESRD (end stage renal disease) on dialysis  Ms. Scott-Featherly is placed on Med-Surg floor for observation.  NPO after midnight.  IR to replace TDC catheter in the am.  Nephrology to see in am to arrange HD as pt has missed her last two dialysis sessions due to malfunctioning HD catheter  Active Problems:   Diabetes mellitus, type II, insulin dependent ([place]), on CGM, with end organ damage: renal, neuropathy, gastroparesis, retinopathy Continue basal insulin.  Diabetes well controlled.  Last hemoglobin A1c was December 19, 2021 it was 5.6    Hyperkalemia Lokelma  given in ER. Recheck potassium level in am.     Other mechanical complication of other cardiac and vascular devices and implants, initial encounter IR to replace the catheter in the am. NPO after midnight in anticipation of procedure    Anemia in chronic kidney disease, on chronic dialysis  Stable    S/P BKA (below knee amputation), right  chronic   DVT prophylaxis: Heparin for DVT prophylaxis tonight. Hold am dose. Code Status:   Full Code  Family Communication:  Diagnosis and plan is discussed with patient and her fianc who is at bedside.  Verbalized understanding and agree with plan.  Questions answered.  Further recommendations tomorrow as clinically indicated Disposition Plan:   Patient is from:  Home  Anticipated DC to:  Home  Anticipated DC date:  Anticipate less than 2 midnight stay in the hospital  Time spent on admission:      40 minutes  Consults called:  Interventional radiology and nephrology consulted by ER physician Admission status:  Observation   Yevonne Aline Camielle Sizer MD [facility]  How to contact the [facility] Attending or Consulting provider [place] or covering provider during after hours [place], for this patient?   Check the care team in [facility] and look for a) attending/consulting [facility] provider listed and b) the [facility] team listed Log into [email or web address] and use [place]'s universal password to access. If you do not have the password, please contact the hospital operator. Locate the [facility] provider you are looking for under [facility] and page  to a number that you can be directly reached. If you still have difficulty reaching the provider, please page the [facility] (Director on Call) for the Hospitalists listed on amion for assistance.  12/28/2021, 7:38 PM

## 2021-12-28 NOTE — Progress Notes (Signed)
Pt was sent to ED due to nonfunctioning TDC that was placed 12/19/21 from her HD unit.  The venous port was not able to flush.  The HD nurse in the hospital attempted to aspirate and flush the venous port but was not able to aspirate any blood only heparin.  Activase was placed in the venous port, however she will likely require a new TDC by IR, which will hopefully be first thing in the morning if not able to perform tonight.  Lokelma 10 grams given for her mild hyperkalemia.  Will likely need to provide HD tomorrow here if she cannot make it to her outpatient spot. She will be under observation until she can have a new TDC placement.

## 2021-12-29 ENCOUNTER — Observation Stay (HOSPITAL_COMMUNITY): Payer: Medicare Other

## 2021-12-29 DIAGNOSIS — N186 End stage renal disease: Secondary | ICD-10-CM | POA: Diagnosis not present

## 2021-12-29 DIAGNOSIS — Z992 Dependence on renal dialysis: Secondary | ICD-10-CM | POA: Diagnosis not present

## 2021-12-29 DIAGNOSIS — T8249XA Other complication of vascular dialysis catheter, initial encounter: Secondary | ICD-10-CM | POA: Diagnosis not present

## 2021-12-29 HISTORY — PX: IR FLUORO GUIDE CV LINE LEFT: IMG2282

## 2021-12-29 LAB — CBC
HCT: 33 % — ABNORMAL LOW (ref 36.0–46.0)
Hemoglobin: 9.8 g/dL — ABNORMAL LOW (ref 12.0–15.0)
MCH: 30.9 pg (ref 26.0–34.0)
MCHC: 29.7 g/dL — ABNORMAL LOW (ref 30.0–36.0)
MCV: 104.1 fL — ABNORMAL HIGH (ref 80.0–100.0)
Platelets: 225 10*3/uL (ref 150–400)
RBC: 3.17 MIL/uL — ABNORMAL LOW (ref 3.87–5.11)
RDW: 16.7 % — ABNORMAL HIGH (ref 11.5–15.5)
WBC: 6.6 10*3/uL (ref 4.0–10.5)
nRBC: 1.5 % — ABNORMAL HIGH (ref 0.0–0.2)

## 2021-12-29 LAB — BASIC METABOLIC PANEL
Anion gap: 13 (ref 5–15)
BUN: 60 mg/dL — ABNORMAL HIGH (ref 6–20)
CO2: 23 mmol/L (ref 22–32)
Calcium: 8.5 mg/dL — ABNORMAL LOW (ref 8.9–10.3)
Chloride: 103 mmol/L (ref 98–111)
Creatinine, Ser: 14.54 mg/dL — ABNORMAL HIGH (ref 0.44–1.00)
GFR, Estimated: 3 mL/min — ABNORMAL LOW (ref >60)
Glucose, Bld: 115 mg/dL — ABNORMAL HIGH (ref 70–99)
Potassium: 5.4 mmol/L — ABNORMAL HIGH (ref 3.5–5.1)
Sodium: 139 mmol/L (ref 135–145)

## 2021-12-29 LAB — GLUCOSE, CAPILLARY
Glucose-Capillary: 119 mg/dL — ABNORMAL HIGH (ref 70–99)
Glucose-Capillary: 94 mg/dL (ref 70–99)
Glucose-Capillary: 90 mg/dL (ref 70–99)

## 2021-12-29 MED ORDER — HEPARIN SODIUM (PORCINE) 1000 UNIT/ML IJ SOLN
INTRAMUSCULAR | Status: AC
  Administered 2021-12-29: 4.2 mL
  Filled 2021-12-29: qty 10

## 2021-12-29 MED ORDER — HYDRALAZINE HCL 25 MG PO TABS: 25.0000 mg | ORAL_TABLET | Freq: Four times a day (QID) | ORAL | Status: DC | PRN

## 2021-12-29 MED ORDER — CHLORHEXIDINE GLUCONATE CLOTH 2 % EX PADS
6.0000 | MEDICATED_PAD | Freq: Every day | CUTANEOUS | Status: DC
  Administered 2021-12-29: 6 via TOPICAL

## 2021-12-29 MED ORDER — CHLORHEXIDINE GLUCONATE CLOTH 2 % EX PADS: 6.0000 | MEDICATED_PAD | Freq: Every day | CUTANEOUS | Status: DC

## 2021-12-29 MED ORDER — HYDRALAZINE HCL 20 MG/ML IJ SOLN
10.0000 mg | INTRAMUSCULAR | Status: DC | PRN
  Administered 2021-12-29: 10 mg via INTRAVENOUS
  Filled 2021-12-29: qty 1

## 2021-12-29 MED ORDER — LIDOCAINE HCL 1 % IJ SOLN
INTRAMUSCULAR | Status: AC
  Administered 2021-12-29: 4 mL via SUBCUTANEOUS
  Filled 2021-12-29: qty 20

## 2021-12-29 MED ORDER — ACETAMINOPHEN 500 MG PO TABS
1000.0000 mg | ORAL_TABLET | Freq: Once | ORAL | Status: AC
  Administered 2021-12-29: 1000 mg via ORAL
  Filled 2021-12-29: qty 2

## 2021-12-29 MED ORDER — INSULIN ASPART 100 UNIT/ML IJ SOLN: 0.0000 [IU] | Freq: Three times a day (TID) | INTRAMUSCULAR | Status: DC

## 2021-12-29 NOTE — Progress Notes (Signed)
PROGRESS NOTE    Heather [name]  YWV:[id number] DOB: 1971/10/25 DOA: 12/28/2021 PCP: [name], MD   Brief Narrative:  HPI: Heather [name] is a 51 y.o. female with medical history significant for ESRD on TTS HD via HD cath, history of CVA, insulin-dependent T2DM, HTN, Charcot foot s/p right BKA, history of DVT not on anticoagulation, anemia of chronic kidney disease who presented to the ED for evaluation of nonfunctioning HD catheter. She missed her last two sessions of HD on Thursday and Saturday due to the malfunctioning HD.  She reports she has mild swelling but has no difficulty breathing.  She reports she has fatigue and is more tired than normal but otherwise has no complaints.  She denies any chest pain, pressure, palpitations, abdominal pain nausea vomiting diarrhea.  She does make small amount of urine intermittently which is unchanged.  She has not had any fevers or illness.  She went to dialysis yesterday but the catheter was not working properly and she was told to come to the hospital for evaluation by IR replacement of catheter.  She reports her blood sugars have been stable in the 130-140 range.  Patient had similar episode of malfunctioning HD catheter last week and had catheter replaced.  She does not know of any trauma to the catheter since this was placed.  She reports she is on any antihypertensive medication at this time as she was having low blood pressure and was using midodrine as needed if blood pressure was low.   ED Course:  Vital signs show BP of 200-212/90-99. HR-80's, RR-14-18, afebrile.  Chest x-ray shows no infiltrate or consolidation.  No pulmonary edema.  Lab work shows WBC 5500 hemoglobin 10.7 hematocrit 35.5 platelets 248,000 sodium 139 potassium 5.6 chloride 101 bicarb 24 creatinine 13.73 which is baseline BUN 54 glucose 123 alkaline phosphatase 71 AST 20 ALT 11 calcium 9.2.  Hospital service asked to observe patient overnight keep n.p.o. after  midnight in anticipation of IR procedure to replace catheter in the morning.  IR and nephrology have  been consulted by ER physician  Assessment & Plan:   Principal Problem:   ESRD (end stage renal disease) on dialysis [facility]) Active Problems:   Diabetes mellitus, type II, insulin dependent ([place]), on CGM, with end organ damage: renal, neuropathy, gastroparesis, retinopathy   Hypertension associated with diabetes ([place])   Anemia in chronic kidney disease, on chronic dialysis ([facility])   Hyperkalemia   S/P BKA (below knee amputation), right ([place])   Other mechanical complication of other cardiac and vascular devices and implants, initial encounter ([place])  ESRD (end stage renal disease) on dialysis/malfunction of left IJ catheter: Patient was admitted mainly because of left IJ catheter malfunction and for that reason, she has missed 2 sessions of hemodialysis.  IR consulted, catheter exchanged already.  [name] due for dialysis later today.  Appreciate IR and nephrology help.  Diabetes mellitus, type II: Controlled.  Continue current dose of long-acting insulin and SSI.   Hyperkalemia: Lokelma given in the ED.  Still hyperkalemic.  Due for dialysis today. Lokelma given in ER. Recheck potassium level in am.    Anemia of chronic disease: Stable.  S/P BKA (below knee amputation), right. Chronic  Hypertensive urgency: Patient came in with blood pressure of 202/94.  No previous history of hypertension, not on any medications.  Blood pressure improving.  Hopefully with dialysis this will improve.  Monitor closely.  Placed on as needed hydralazine.  DVT prophylaxis: heparin injection 5,000  Units Start: 12/28/21 2230   Code Status: Full Code  Family Communication: Husband present at bedside.  Plan of care discussed with patient in length and he verbalized understanding and agreed with it.  Status is: Observation  The patient will require care spanning > 2 midnights and should be moved to inpatient  because: Needs hemodialysis today and possibly tomorrow.  Estimated body mass index is 33.35 kg/m as calculated from the following:   Height as of this encounter: 5\' 8"  (1.727 m).   Weight as of this encounter: 99.5 kg.  Nutritional Assessment: Body mass index is 33.35 kg/m.[name] Seen by dietician.  I agree with the assessment and plan as outlined below: Nutrition Status:   Skin Assessment: I have examined the patient's skin and I agree with the wound assessment as performed by the wound care RN as outlined below:    Consultants:  Nephrology and IR  Procedures:  Left IJ catheter exchange  Antimicrobials:  Anti-infectives (From admission, onward)    None          Subjective: Seen and examined.  Husband the bedside.  Patient has no complaints.  Objective: Vitals:   12/28/21 2330 12/29/21 0149 12/29/21 0442 12/29/21 1012  BP: (!) 206/98 (!) 150/80 (!) 114/58 (!) 166/90  Pulse: 85 88 87 82  Resp: 20 17 16 18   Temp: 97.7 F (36.5 C) 97.8 F (36.6 C) 98.1 F (36.7 C) 98.1 F (36.7 C)  TempSrc:      SpO2: 97% 97% 94% 99%  Weight:      Height:        Intake/Output Summary (Last 24 hours) at 12/29/2021 1301 Last data filed at 12/29/2021 0200 Gross per 24 hour  Intake 120 ml  Output 0 ml  Net 120 ml   Filed Weights   12/28/21 1108  Weight: 99.5 kg    Examination:  General exam: Appears calm and comfortable  Respiratory system: Clear to auscultation. Respiratory effort normal. Cardiovascular system: S1 & S2 heard, RRR. No JVD, murmurs, rubs, gallops or clicks. No pedal edema. Gastrointestinal system: Abdomen is nondistended, soft and nontender. No organomegaly or masses felt. Normal bowel sounds heard. Central nervous system: Alert and oriented. No focal neurological deficits. Extremities: Right BKA Skin: No rashes, lesions or ulcers Psychiatry: Judgement and insight appear normal. Mood & affect appropriate.    Data Reviewed: I have personally reviewed  following labs and imaging studies  CBC: Recent Labs  Lab 12/28/21 1146 12/29/21 0231  WBC 5.5 6.6  NEUTROABS 3.0  --   HGB 10.7* 9.8*  HCT 35.5* 33.0*  MCV 103.5* 104.1*  PLT 248 035   Basic Metabolic Panel: Recent Labs  Lab 12/28/21 1146 12/29/21 0659  NA 139 139  K 5.6* 5.4*  CL 101 103  CO2 24 23  GLUCOSE 123* 115*  BUN 54* 60*  CREATININE 13.73* 14.54*  CALCIUM 9.2 8.5*   GFR: Estimated Creatinine Clearance: 5.6 mL/min (A) (by C-G formula based on SCr of 14.54 mg/dL (H)). Liver Function Tests: Recent Labs  Lab 12/28/21 1146  AST 20  ALT 11  ALKPHOS 71  BILITOT 0.9  PROT 7.7  ALBUMIN 3.4*   No results for input(s): LIPASE, AMYLASE in the last 168 hours. No results for input(s): AMMONIA in the last 168 hours. Coagulation Profile: No results for input(s): INR, PROTIME in the last 168 hours. Cardiac Enzymes: No results for input(s): CKTOTAL, CKMB, CKMBINDEX, TROPONINI in the last 168 hours. BNP (last 3 results) No results for  input(s): PROBNP in the last 8760 hours. HbA1C: No results for input(s): HGBA1C in the last 72 hours. CBG: Recent Labs  Lab 12/28/21 2333 12/29/21 0640 12/29/21 1240  GLUCAP 155* 119* 94   Lipid Profile: No results for input(s): CHOL, HDL, LDLCALC, TRIG, CHOLHDL, LDLDIRECT in the last 72 hours. Thyroid Function Tests: No results for input(s): TSH, T4TOTAL, FREET4, T3FREE, THYROIDAB in the last 72 hours. Anemia Panel: No results for input(s): VITAMINB12, FOLATE, FERRITIN, TIBC, IRON, RETICCTPCT in the last 72 hours. Sepsis Labs: No results for input(s): PROCALCITON, LATICACIDVEN in the last 168 hours.  Recent Results (from the past 240 hour(s))  Resp Panel by RT-PCR (Flu A&B, Covid) Nasopharyngeal Swab     Status: None   Collection Time: 12/28/21  7:00 PM   Specimen: Nasopharyngeal Swab; Nasopharyngeal(NP) swabs in vial transport medium  Result Value Ref Range Status   SARS Coronavirus 2 by RT PCR NEGATIVE NEGATIVE Final     Comment: (NOTE) SARS-CoV-2 target nucleic acids are NOT DETECTED.  The SARS-CoV-2 RNA is generally detectable in upper respiratory specimens during the acute phase of infection. The lowest concentration of SARS-CoV-2 viral copies this assay can detect is 138 copies/mL. A negative result does not preclude SARS-Cov-2 infection and should not be used as the sole basis for treatment or other patient management decisions. A negative result may occur with  improper specimen collection/handling, submission of specimen other than nasopharyngeal swab, presence of viral mutation(s) within the areas targeted by this assay, and inadequate number of viral copies(<138 copies/mL). A negative result must be combined with clinical observations, patient history, and epidemiological information. The expected result is Negative.  Fact Sheet for Patients:  EntrepreneurPulse.com.au  Fact Sheet for Healthcare Providers:  IncredibleEmployment.be  This test is no t yet approved or cleared by the [place] FDA and  has been authorized for detection and/or diagnosis of SARS-CoV-2 by FDA under an Emergency Use Authorization (EUA). This EUA will remain  in effect (meaning this test can be used) for the duration of the COVID-19 declaration under Section 564(b)(1) of the Act, 21 U.S.C.section 360bbb-3(b)(1), unless the authorization is terminated  or revoked sooner.       Influenza A by PCR NEGATIVE NEGATIVE Final   Influenza B by PCR NEGATIVE NEGATIVE Final    Comment: (NOTE) The Xpert Xpress SARS-CoV-2/FLU/RSV plus assay is intended as an aid in the diagnosis of influenza from Nasopharyngeal swab specimens and should not be used as a sole basis for treatment. Nasal washings and aspirates are unacceptable for Xpert Xpress SARS-CoV-2/FLU/RSV testing.  Fact Sheet for Patients: EntrepreneurPulse.com.au  Fact Sheet for Healthcare  Providers: IncredibleEmployment.be  This test is not yet approved or cleared by the [place] FDA and has been authorized for detection and/or diagnosis of SARS-CoV-2 by FDA under an Emergency Use Authorization (EUA). This EUA will remain in effect (meaning this test can be used) for the duration of the COVID-19 declaration under Section 564(b)(1) of the Act, 21 U.S.C. section 360bbb-3(b)(1), unless the authorization is terminated or revoked.  Performed at [facility], [place] [address] [place], [place] [address]       Radiology Studies: DG Chest 1 View  Result Date: 12/28/2021 CLINICAL DATA:  Shortness of breath EXAM: CHEST  1 VIEW COMPARISON:  12/18/2021 FINDINGS: Transverse diameter of heart is increased. There are no signs of pulmonary edema or focal pulmonary consolidation. There is no significant pleural effusion or pneumothorax. Left hemidiaphragm is elevated. There is interval repositioning or replacement  of left jugular dialysis catheter with its tip in the region of superior vena cava close to the right atrium. IMPRESSION: Cardiomegaly. There are no signs of pulmonary edema or new focal infiltrates. Electronically Signed   By: Elmer Picker M.D.   On: 12/28/2021 11:48    Scheduled Meds:  calcium acetate  667 mg Oral TID WC   Chlorhexidine Gluconate Cloth  6 each Topical Daily   heparin  5,000 Units Subcutaneous Q8H   insulin aspart  0-6 Units Subcutaneous TID WC   insulin glargine-yfgn  26 Units Subcutaneous QHS   sodium chloride flush  3 mL Intravenous Q12H   Continuous Infusions:  sodium chloride       LOS: 0 days   Time spent: 31 minutes   Darliss Cheney, MD [facility]  12/29/2021, 1:01 PM  Please page via [place] and do not message via secure chat for anything urgent. Secure chat can be used for anything non urgent.  How to contact the [facility] Attending or Consulting provider [place] or covering provider during after hours [place], for this patient?  Check the care team in [facility] and look for a) attending/consulting [facility] provider listed and b) the [facility] team listed. Page or secure chat [phone]. Log into [email or web address] and use [place]'s universal password to access. If you do not have the password, please contact the hospital operator. Locate the [facility] provider you are looking for under [facility] and page to a number that you can be directly reached. If you still have difficulty reaching the provider, please page the [facility] (Director on Call) for the Hospitalists listed on amion for assistance.

## 2021-12-29 NOTE — Progress Notes (Signed)
TRH night cross cover note:  I was notified by RN of patient's bp 206/99.   Per my chart review, patient, with history of end-stage renal disease on hemodialysis on Tuesday, Thursday, Saturday schedule, is here with dysfunctioning hemodialysis catheter, with plan for IR placement of tunneled dialysis catheter in the morning followed by nephrology is plan for hemodialysis on 12/29/20.  In reviewing her vitals since presentation, it appears that her systolic blood pressures have been consistently in the low 200s over the course of the last 12 hours she had received a dose of oral labetalol earlier today, very little impact on her blood pressure.  [name] currently asymptomatic, including no evidence of acute focal neurologic deficits.will place order for as needed IV hydralazine, but with anticipation that she would likely continue to run hypertensive overnight until she receives hemodialysis tomorrow (12/29/20).     Babs Bertin, DO Hospitalist

## 2021-12-29 NOTE — Progress Notes (Signed)
TRH night cross cover note:  I was contacted by RN with request for orders for CBG monitoring in this patient who is a known diabetic.  In the setting of comorbidities that include end-stage renal disease, I have placed orders for CBG monitoring on a before every meal and at bedtime basis with very low-dose SSI.     Babs Bertin, DO Hospitalist

## 2021-12-29 NOTE — Progress Notes (Signed)
Online clearance test

## 2021-12-29 NOTE — Progress Notes (Signed)
Code 44 letter done and given to patient with understanding verbalized.

## 2021-12-29 NOTE — Plan of Care (Signed)
  Problem: Education: Goal: Knowledge of General Education information will improve Description Including pain rating scale, medication(s)/side effects and non-pharmacologic comfort measures Outcome: Progressing   

## 2021-12-29 NOTE — Procedures (Signed)
Interventional Radiology Procedure Note  Procedure: Tunneled HD catheter exchange  Complications: None  Estimated Blood Loss: None  Findings: Existing tunneled left IJ 23 cm tip to cuff length Palindrome catheter exchanged over wire for 27 cm tip to cuff length catheter with further extension into RA. Aspirates and flushes well; ok to use.  Venetia Night. Kathlene Cote, M.D Pager:  [phone]

## 2021-12-29 NOTE — Care Management CC44 (Signed)
Condition Code 44 Documentation Completed  Patient Details  Name: Heather [name] MRN: [id number] Date of Birth: April 02, 1971   Condition Code 44 given:  Yes Patient signature on Condition Code 44 notice:  Yes (Permits CM to sign.) Documentation of 2 MD's agreement:  Yes Code 44 added to claim:  Yes    Tom-Johnson, Renea Ee, RN 12/29/2021, 3:54 PM

## 2021-12-29 NOTE — Progress Notes (Signed)
°   12/28/21 2330  Assess: MEWS Score  Temp 97.7 F (36.5 C)  BP (!) 206/98  Pulse Rate 85  Resp 20  SpO2 97 %  O2 Device Room Air  Assess: MEWS Score  MEWS Temp 0  MEWS Systolic 2  MEWS Pulse 0  MEWS RR 0  MEWS LOC 0  MEWS Score 2  MEWS Score Color Yellow  Assess: if the MEWS score is Yellow or Red  Were vital signs taken at a resting state? Yes  Focused Assessment No change from prior assessment  Early Detection of Sepsis Score *See Row Information* Low  MEWS guidelines implemented *See Row Information* Yes  Treat  Pain Score 0  Notify: Charge Nurse/RN  Name of Charge Nurse/RN Notified Emmanuel,RN  Date Charge Nurse/RN Notified 12/28/21  Time Charge Nurse/RN Notified 2330  Notify: Provider  Provider Name/Title Howerter,J,MD  Date Provider Notified 12/29/21  Time Provider Notified 0001  Notification Type Page (secure chat)  Notification Reason Other (Comment) (High blood pressure,yellow MEWS)  Provider response See new orders  Date of Provider Response 12/29/21  Time of Provider Response 0002

## 2021-12-29 NOTE — Progress Notes (Signed)
° °  Patient Status: [facility] - In-pt  Assessment and Plan: Patient in need of venous access.   Exchange tunneled L IJ tunneled HD catheter   ______________________________________________________________________   History of Present Illness: Heather [name] is a 51 y.o. female   Left IJ tunneled dialysis catheter in place Placed in IR 12/24 Malfunction since yesterday Unable to flush - no blood return Scheduled for exchange in IR  Allergies and medications reviewed.   Review of Systems: A 12 point ROS discussed and pertinent positives are indicated in the HPI above.  All other systems are negative.  Vital Signs: BP (!) 114/58 (BP Location: Right Arm)    Pulse 87    Temp 98.1 F (36.7 C)    Resp 16    Ht 5\' 8"  (1.727 m)    Wt 219 lb 5.7 oz (99.5 kg)    LMP  (LMP Unknown)    SpO2 94%    BMI 33.35 kg/m   Physical Exam Vitals reviewed.  Skin:    General: Skin is warm.     Comments: Site is clean an dry NT no bleeding  No sign of infection  Neurological:     Mental Status: [name] alert.     Imaging reviewed.   Labs:  COAGS: No results for input(s): INR, APTT in the last 8760 hours.  BMP: Recent Labs    12/18/21 1203 12/19/21 0309 12/19/21 0654 12/19/21 1329 12/19/21 1432 12/28/21 1146 12/29/21 0659  NA 136 138  --   --   --  139 139  K 6.0* 6.7*   < > 5.0 4.1 5.6* 5.4*  CL 102 104  --   --   --  101 103  CO2 20* 22  --   --   --  24 23  GLUCOSE 101* 115*  --   --   --  123* 115*  BUN 47* 51*  --   --   --  54* 60*  CALCIUM 8.8* 8.5*  --   --   --  9.2 8.5*  CREATININE 12.69* 13.20*  --   --   --  13.73* 14.54*  GFRNONAA 3* 3*  --   --   --  3* 3*   < > = values in this interval not displayed.    Scheduled for exchange of L IJ tunneled dialysis catheter Malfunction Pt is aware of procedure benefits and risks including but not limited to Infection; vessel damage Agreeable to proceed Consent signed and in chart   Electronically Signed: Lavonia Drafts, PA-C 12/29/2021, 9:02 AM   I spent a total of 15 minutes in face to face in clinical consultation, greater than 50% of which was counseling/coordinating care for venous access.

## 2021-12-30 ENCOUNTER — Encounter (HOSPITAL_COMMUNITY): Payer: Self-pay | Admitting: *Deleted

## 2021-12-30 DIAGNOSIS — T8249XA Other complication of vascular dialysis catheter, initial encounter: Secondary | ICD-10-CM | POA: Diagnosis not present

## 2021-12-30 DIAGNOSIS — Z992 Dependence on renal dialysis: Secondary | ICD-10-CM | POA: Diagnosis not present

## 2021-12-30 DIAGNOSIS — N186 End stage renal disease: Secondary | ICD-10-CM | POA: Diagnosis not present

## 2021-12-30 LAB — BASIC METABOLIC PANEL
Anion gap: 11 (ref 5–15)
BUN: 31 mg/dL — ABNORMAL HIGH (ref 6–20)
CO2: 22 mmol/L (ref 22–32)
Calcium: 8.3 mg/dL — ABNORMAL LOW (ref 8.9–10.3)
Chloride: 103 mmol/L (ref 98–111)
Creatinine, Ser: 9.61 mg/dL — ABNORMAL HIGH (ref 0.44–1.00)
GFR, Estimated: 4 mL/min — ABNORMAL LOW (ref >60)
Glucose, Bld: 83 mg/dL (ref 70–99)
Potassium: 4.5 mmol/L (ref 3.5–5.1)
Sodium: 136 mmol/L (ref 135–145)

## 2021-12-30 LAB — MAGNESIUM: Magnesium: 2.1 mg/dL (ref 1.7–2.4)

## 2021-12-30 LAB — GLUCOSE, CAPILLARY
Glucose-Capillary: 92 mg/dL (ref 70–99)
Glucose-Capillary: 130 mg/dL — ABNORMAL HIGH (ref 70–99)

## 2021-12-30 NOTE — Discharge Summary (Signed)
Physician Discharge Summary  Heather [name] ERX:[id number] DOB: [birth year] DOA: 12/28/2021  PCP: [name], MD  Admit date: 12/28/2021 Discharge date: 12/30/2021 30 Day Unplanned Readmission Risk Score    Flowsheet Row ED to Hosp-Admission (Discharged) from 12/18/2021 in [facility] 4E CV SURGICAL PROGRESSIVE CARE  30 Day Unplanned Readmission Risk Score (%) 36.03 Filed at 12/19/2021 1600       This score is the patient's risk of an unplanned readmission within 30 days of being discharged (0 -100%). The score is based on dignosis, age, lab data, medications, orders, and past utilization.   Low:  0-14.9   Medium: 15-21.9   High: 22-29.9   Extreme: 30 and above          Admitted From: Home Disposition: Home  Recommendations for Outpatient Follow-up:  Follow up with PCP in 1-2 weeks Please obtain BMP/CBC in one week Please follow up with your PCP on the following pending results: Unresulted Labs (From admission, onward)     Start     Ordered   Signed and Held  Hepatitis B surface antigen  (New Admission Hemo Labs (Hepatitis B))  Once,   R        Signed and Held   Signed and Held  Hepatitis B surface antibody  (New Admission Hemo Labs (Hepatitis B))  Once,   R        Signed and Held   Signed and Held  Hepatitis B surface antibody,quantitative  (New Admission Hemo Labs (Hepatitis B))  Once,   R        Signed and Held              Home Health: None Equipment/Devices: None  Discharge Condition: Stable CODE STATUS: Full code Diet recommendation: Renal  Subjective: Seen and examined this morning.  She has no complaints.  [name] excited to go home.  Brief/Interim Summary:  Heather [name] is a 51 y.o. female with medical history significant for ESRD on TTS HD via HD cath, history of CVA, insulin-dependent T2DM, HTN, Charcot foot s/p right BKA, history of DVT not on anticoagulation, anemia of chronic kidney disease who presented to the ED for evaluation of  nonfunctioning HD catheter. She missed her last two sessions of HD on Thursday and Saturday due to the malfunctioning HD.   Patient had similar episode of malfunctioning HD catheter last week and had catheter replaced.  She does not know of any trauma to the catheter since this was placed.  She reports she is on any antihypertensive medication at this time as she was having low blood pressure and was using midodrine as needed if blood pressure was low.  However Vital signs show BP of 200-212/90-99.  Chest x-ray shows no infiltrate or consolidation.  No pulmonary edema.  sodium 139 potassium 5.6 chloride 101 bicarb 24 creatinine 13.73 which is baseline BUN 54 glucose 123 alkaline phosphatase 71 AST 20 ALT 11 calcium 9.2.  Patient was admitted to hospitalist service for exchange of the dialysis catheter, nephrology and IR consulted, she underwent exchange of left IJ catheter on 12/29/2021 and this followed with hemodialysis.  Nephrology has cleared the patient this morning to go home and resume her outpatient dialysis.  Patient has no complaints.  Her blood pressure also improved after dialysis without needing any in hypertensives.   Discharge Diagnoses:  Principal Problem:   ESRD (end stage renal disease) on dialysis [facility]) Active Problems:   Diabetes mellitus, type II, insulin dependent ([place]), on CGM,  with end organ damage: renal, neuropathy, gastroparesis, retinopathy   Hypertension associated with diabetes ([place])   Anemia in chronic kidney disease, on chronic dialysis ([facility])   Hyperkalemia   Admission for dialysis and dialysis catheter care ([place])   S/P BKA (below knee amputation), right ([place])   Other mechanical complication of other cardiac and vascular devices and implants, initial encounter ([place])   ESRD (end stage renal disease) ([place])    Discharge Instructions   Allergies as of 12/30/2021       Reactions   Ibuprofen Other (See Comments)   CKD stage 3. Should avoid.   Dilaudid [hydromorphone  Hcl] Itching, Other (See Comments)   Can take with Benadryl.   Iodinated Contrast Media Rash   Tramadol Itching        Medication List     TAKE these medications    acetaminophen 500 MG tablet Commonly known as: TYLENOL Take 500 mg by mouth every 6 (six) hours as needed for mild pain.   calcium acetate 667 MG capsule Commonly known as: PHOSLO Take 667 mg by mouth 3 (three) times daily with meals.   FreeStyle Libre 14 Day Sensor Misc 1 patch by Does not apply route every 14 (fourteen) days.   gabapentin 300 MG capsule Commonly known as: NEURONTIN Take 1 capsule (300 mg total) by mouth 3 (three) times a week. Take after dialysis. Do not take more than three times weekly.   Insulin Pen Needle 29G X 5MM Misc 1 Device by Does not apply route 4 (four) times daily.   midodrine 5 MG tablet Commonly known as: PROAMATINE Take 1 tablet (5 mg total) by mouth See admin instructions. Given at Dialysis if needed   NovoLOG FlexPen 100 UNIT/ML FlexPen Generic drug: insulin aspart Inject 6 Units into the skin 3 (three) times daily with meals. Sliding scale   Tresiba FlexTouch 100 UNIT/ML FlexTouch Pen Generic drug: insulin degludec Inject 0.26 mLs (26 Units total) into the skin at bedtime.        Follow-up Information     [name], MD Follow up in 1 week(s).   Specialty: Family Medicine Contact information: [place] [address] [phone]                Allergies  Allergen Reactions   Ibuprofen Other (See Comments)    CKD stage 3. Should avoid.   Dilaudid [Hydromorphone Hcl] Itching and Other (See Comments)    Can take with Benadryl.   Iodinated Contrast Media Rash   Tramadol Itching    Consultations: Nephrology and IR   Procedures/Studies: DG Chest 1 View  Result Date: 12/28/2021 CLINICAL DATA:  Shortness of breath EXAM: CHEST  1 VIEW COMPARISON:  12/18/2021 FINDINGS: Transverse diameter of heart is increased. There are no signs of  pulmonary edema or focal pulmonary consolidation. There is no significant pleural effusion or pneumothorax. Left hemidiaphragm is elevated. There is interval repositioning or replacement of left jugular dialysis catheter with its tip in the region of superior vena cava close to the right atrium. IMPRESSION: Cardiomegaly. There are no signs of pulmonary edema or new focal infiltrates. Electronically Signed   By: Elmer Picker M.D.   On: 12/28/2021 11:48   DG Chest 2 View  Result Date: 12/18/2021 CLINICAL DATA:  Dialysis catheter coming out EXAM: CHEST - 2 VIEW COMPARISON:  12/17/2021 FINDINGS: The left dialysis catheter has retracted further with the tip projected over the left clavicle, likely extravascular. Heart is borderline in size. Lungs  clear. No effusions. No acute bony abnormality. Old left rib fractures. IMPRESSION: Left dialysis catheter has retracted further with the tip likely extravascular in the left upper chest wall. Electronically Signed   By: Rolm Baptise M.D.   On: 12/18/2021 12:39   DG Chest 2 View  Result Date: 12/17/2021 CLINICAL DATA:  Dialysis catheter partially out EXAM: CHEST - 2 VIEW COMPARISON:  09/25/2021 FINDINGS: Left dialysis catheter has pulled back. The tip is in the left upper chest. It is difficult to determine if this is chest into the left internal jugular vein or outside the vein in the left upper chest wall. Cardiomegaly. No confluent airspace opacities, effusions or edema. IMPRESSION: Tip of the left dialysis catheter is in the left upper chest and could be outside the vein or just into the left internal jugular vein. No acute cardiopulmonary disease. Electronically Signed   By: Rolm Baptise M.D.   On: 12/17/2021 10:48   IR Fluoro Guide CV Line Left  Result Date: 12/29/2021 CLINICAL DATA:  Poorly functioning left jugular tunneled dialysis catheter which was placed on 12/19/2021. A 23 cm tip to cuff length catheter was placed at that time. EXAM: EXCHANGE OF  TUNNELED CENTRAL VENOUS HEMODIALYSIS CATHETER UNDER FLUOROSCOPIC GUIDANCE ANESTHESIA/SEDATION: None MEDICATIONS: None FLUOROSCOPY TIME:  42 seconds.  35.0 mGy. PROCEDURE: The procedure, risks, benefits, and alternatives were explained to the patient. Questions regarding the procedure were encouraged and answered. The patient understands and consents to the procedure. A time-out was performed prior to initiating the procedure. The preexisting dialysis catheter and surrounding skin were prepped with chlorhexidine in a sterile fashion, and a sterile drape was applied covering the operative field. Maximum barrier sterile technique with sterile gowns and gloves were used for the procedure. Local anesthesia was provided with 1% lidocaine. The preexisting dialysis catheter was removed over a guidewire after freeing the subcutaneous cuff using blunt dissection. A new Palindrome tunneled hemodialysis catheter measuring 28 cm from tip to cuff was chosen for placement. This was advanced over the guidewire under fluoroscopy. Final catheter positioning was confirmed and documented with a fluoroscopic spot image. The catheter was aspirated, flushed with saline, and injected with appropriate volume heparin dwells. COMPLICATIONS: None.  No pneumothorax. FINDINGS: Initial fluoroscopy demonstrates the previously placed left jugular tunneled dialysis catheter tip to lie at the SVC/RA junction. After catheter exchange, the new catheter tip lies in the right atrium further than the previously placed catheter. The catheter aspirates normally and is ready for immediate use. IMPRESSION: Exchange of tunneled hemodialysis catheter via the left internal jugular vein. The catheter tips lie in the right atrium. A longer 28 cm tip to cuff length catheter was placed to extend further into the right atrium. The catheter is ready for immediate use. Electronically Signed   By: Aletta Edouard M.D.   On: 12/29/2021 15:14   IR Removal Tun Cv Cath  W/O FL  Result Date: 12/19/2021 INDICATION: Inadvertent retraction of chronic left internal jugular approach dialysis catheter. Patient presents today for fluoroscopic guided exchange versus definitive replacement. EXAM: TUNNELED CENTRAL VENOUS HEMODIALYSIS CATHETER PLACEMENT WITH ULTRASOUND AND FLUOROSCOPIC GUIDANCE MEDICATIONS: Ancef 2 gm IV . The antibiotic was given in an appropriate time interval prior to skin puncture. ANESTHESIA/SEDATION: Moderate (conscious) sedation was employed during this procedure as administered by the Interventional Radiology RN. A total of Versed 1 mg and Fentanyl 50 mcg was administered intravenously. Moderate Sedation Time: 35 minutes. The patient's level of consciousness and vital signs were monitored continuously by radiology  nursing throughout the procedure under my direct supervision. FLUOROSCOPY TIME:  6 minutes (106 mGy) COMPLICATIONS: None immediate. PROCEDURE: Informed written consent was obtained from the patient after a discussion of the risks, benefits, and alternatives to treatment. Questions regarding the procedure were encouraged and answered. The left neck and chest and external portion of the existing dialysis catheter were prepped with chlorhexidine in a sterile fashion, and a sterile drape was applied covering the operative field. Maximum barrier sterile technique with sterile gowns and gloves were used for the procedure. A timeout was performed prior to the initiation of the procedure. Sonographic evaluation performed left neck demonstrating the left internal jugular approach dialysis catheter had retracted outside the entrance site of the left internal jugular vein. This was confirmed with a spot fluoroscopic image As such the remainder of the bowel positioned dialysis catheter was removed and we proceeded with definitive new left internal jugular approach dialysis catheter placement. Note, the decision was made to proceed with left internal jugular approach  dialysis catheter placement as patient states she has an appointment to undergo evaluation for potential creation of a right upper extremity dialysis access. After creating a small venotomy incision, a micropuncture kit was utilized to access the internal jugular vein. Real-time ultrasound guidance was utilized for vascular access including the acquisition of a permanent ultrasound image documenting patency of the accessed vessel. The microwire was utilized to measure appropriate catheter length. A stiff Glidewire was advanced to the level of the IVC and the micropuncture sheath was exchanged for a peel-away sheath. A palindrome tunneled hemodialysis catheter measuring 23 cm from tip to cuff was tunneled in a retrograde fashion from the anterior chest wall to the venotomy incision. The catheter was then placed through the peel-away sheath with tips ultimately positioned within the superior aspect of the right atrium. Final catheter positioning was confirmed and documented with a spot radiographic image. The catheter aspirates and flushes normally. The catheter was flushed with appropriate volume heparin dwells. The catheter exit site was secured with a 0-Prolene retention suture. The venotomy incision was closed with an interrupted 4-0 Vicryl, Dermabond and Steri-strips. Dressings were applied. The patient tolerated the procedure well without immediate post procedural complication. IMPRESSION: 1. Sonographic evaluation performed complete retraction of existing dialysis catheter outside the left internal jugular vein. As such, the malpositioned dialysis catheter was removed intact. 2. Successful placement of 19 cm tip to cuff tunneled hemodialysis catheter via the left internal jugular vein with tips terminating within the superior aspect of the right atrium. The catheter is ready for immediate use. As above, the decision was made to continue with a left internal jugular approach dialysis catheter, as she states she  is to undergo evaluation for creation of a permanent dialysis access within the right upper extremity early next month. Electronically Signed   By: Sandi Mariscal M.D.   On: 12/19/2021 13:51   IR US Guide Vasc Access Left  Result Date: 12/19/2021 INDICATION: Inadvertent retraction of chronic left internal jugular approach dialysis catheter. Patient presents today for fluoroscopic guided exchange versus definitive replacement. EXAM: TUNNELED CENTRAL VENOUS HEMODIALYSIS CATHETER PLACEMENT WITH ULTRASOUND AND FLUOROSCOPIC GUIDANCE MEDICATIONS: Ancef 2 gm IV . The antibiotic was given in an appropriate time interval prior to skin puncture. ANESTHESIA/SEDATION: Moderate (conscious) sedation was employed during this procedure as administered by the Interventional Radiology RN. A total of Versed 1 mg and Fentanyl 50 mcg was administered intravenously. Moderate Sedation Time: 35 minutes. The patient's level of consciousness and vital signs  were monitored continuously by radiology nursing throughout the procedure under my direct supervision. FLUOROSCOPY TIME:  6 minutes (014 mGy) COMPLICATIONS: None immediate. PROCEDURE: Informed written consent was obtained from the patient after a discussion of the risks, benefits, and alternatives to treatment. Questions regarding the procedure were encouraged and answered. The left neck and chest and external portion of the existing dialysis catheter were prepped with chlorhexidine in a sterile fashion, and a sterile drape was applied covering the operative field. Maximum barrier sterile technique with sterile gowns and gloves were used for the procedure. A timeout was performed prior to the initiation of the procedure. Sonographic evaluation performed left neck demonstrating the left internal jugular approach dialysis catheter had retracted outside the entrance site of the left internal jugular vein. This was confirmed with a spot fluoroscopic image As such the remainder of the bowel  positioned dialysis catheter was removed and we proceeded with definitive new left internal jugular approach dialysis catheter placement. Note, the decision was made to proceed with left internal jugular approach dialysis catheter placement as patient states she has an appointment to undergo evaluation for potential creation of a right upper extremity dialysis access. After creating a small venotomy incision, a micropuncture kit was utilized to access the internal jugular vein. Real-time ultrasound guidance was utilized for vascular access including the acquisition of a permanent ultrasound image documenting patency of the accessed vessel. The microwire was utilized to measure appropriate catheter length. A stiff Glidewire was advanced to the level of the IVC and the micropuncture sheath was exchanged for a peel-away sheath. A palindrome tunneled hemodialysis catheter measuring 23 cm from tip to cuff was tunneled in a retrograde fashion from the anterior chest wall to the venotomy incision. The catheter was then placed through the peel-away sheath with tips ultimately positioned within the superior aspect of the right atrium. Final catheter positioning was confirmed and documented with a spot radiographic image. The catheter aspirates and flushes normally. The catheter was flushed with appropriate volume heparin dwells. The catheter exit site was secured with a 0-Prolene retention suture. The venotomy incision was closed with an interrupted 4-0 Vicryl, Dermabond and Steri-strips. Dressings were applied. The patient tolerated the procedure well without immediate post procedural complication. IMPRESSION: 1. Sonographic evaluation performed complete retraction of existing dialysis catheter outside the left internal jugular vein. As such, the malpositioned dialysis catheter was removed intact. 2. Successful placement of 19 cm tip to cuff tunneled hemodialysis catheter via the left internal jugular vein with tips  terminating within the superior aspect of the right atrium. The catheter is ready for immediate use. As above, the decision was made to continue with a left internal jugular approach dialysis catheter, as she states she is to undergo evaluation for creation of a permanent dialysis access within the right upper extremity early next month. Electronically Signed   By: Sandi Mariscal M.D.   On: 12/19/2021 13:51     Discharge Exam: Vitals:   12/29/21 2044 12/30/21 0550  BP: (!) 148/84 138/67  Pulse: 81 79  Resp: 18 17  Temp: 98.1 F (36.7 C) 97.9 F (36.6 C)  SpO2: 95% 97%   Vitals:   12/29/21 1800 12/29/21 1849 12/29/21 2044 12/30/21 0550  BP: 134/84 (!) 146/80 (!) 148/84 138/67  Pulse: 85 85 81 79  Resp: '16 13 18 17  ' Temp:  98.3 F (36.8 C) 98.1 F (36.7 C) 97.9 F (36.6 C)  TempSrc:  Oral Oral Oral  SpO2: 97% 97% 95% 97%  Weight:  97.6 kg    Height:        General: Pt is alert, awake, not in acute distress Cardiovascular: RRR, S1/S2 +, no rubs, no gallops Respiratory: CTA bilaterally, no wheezing, no rhonchi Abdominal: Soft, NT, ND, bowel sounds + Extremities: no edema, no cyanosis, right BKA    The results of significant diagnostics from this hospitalization (including imaging, microbiology, ancillary and laboratory) are listed below for reference.     Microbiology: Recent Results (from the past 240 hour(s))  Resp Panel by RT-PCR (Flu A&B, Covid) Nasopharyngeal Swab     Status: None   Collection Time: 12/28/21  7:00 PM   Specimen: Nasopharyngeal Swab; Nasopharyngeal(NP) swabs in vial transport medium  Result Value Ref Range Status   SARS Coronavirus 2 by RT PCR NEGATIVE NEGATIVE Final    Comment: (NOTE) SARS-CoV-2 target nucleic acids are NOT DETECTED.  The SARS-CoV-2 RNA is generally detectable in upper respiratory specimens during the acute phase of infection. The lowest concentration of SARS-CoV-2 viral copies this assay can detect is 138 copies/mL. A negative  result does not preclude SARS-Cov-2 infection and should not be used as the sole basis for treatment or other patient management decisions. A negative result may occur with  improper specimen collection/handling, submission of specimen other than nasopharyngeal swab, presence of viral mutation(s) within the areas targeted by this assay, and inadequate number of viral copies(<138 copies/mL). A negative result must be combined with clinical observations, patient history, and epidemiological information. The expected result is Negative.  Fact Sheet for Patients:  EntrepreneurPulse.com.au  Fact Sheet for Healthcare Providers:  IncredibleEmployment.be  This test is no t yet approved or cleared by the [place] FDA and  has been authorized for detection and/or diagnosis of SARS-CoV-2 by FDA under an Emergency Use Authorization (EUA). This EUA will remain  in effect (meaning this test can be used) for the duration of the COVID-19 declaration under Section 564(b)(1) of the Act, 21 U.S.C.section 360bbb-3(b)(1), unless the authorization is terminated  or revoked sooner.       Influenza A by PCR NEGATIVE NEGATIVE Final   Influenza B by PCR NEGATIVE NEGATIVE Final    Comment: (NOTE) The Xpert Xpress SARS-CoV-2/FLU/RSV plus assay is intended as an aid in the diagnosis of influenza from Nasopharyngeal swab specimens and should not be used as a sole basis for treatment. Nasal washings and aspirates are unacceptable for Xpert Xpress SARS-CoV-2/FLU/RSV testing.  Fact Sheet for Patients: EntrepreneurPulse.com.au  Fact Sheet for Healthcare Providers: IncredibleEmployment.be  This test is not yet approved or cleared by the [place] FDA and has been authorized for detection and/or diagnosis of SARS-CoV-2 by FDA under an Emergency Use Authorization (EUA). This EUA will remain in effect (meaning this test can be used)  for the duration of the COVID-19 declaration under Section 564(b)(1) of the Act, 21 U.S.C. section 360bbb-3(b)(1), unless the authorization is terminated or revoked.  Performed at [facility], [place] [address] [place], [place] [address]      Labs: BNP (last 3 results) No results for input(s): BNP in the last 8760 hours. Basic Metabolic Panel: Recent Labs  Lab 12/28/21 1146 12/29/21 0659 12/30/21 0337  NA 139 139 136  K 5.6* 5.4* 4.5  CL 101 103 103  CO2 '24 23 22  ' GLUCOSE 123* 115* 83  BUN 54* 60* 31*  CREATININE 13.73* 14.54* 9.61*  CALCIUM 9.2 8.5* 8.3*  MG  --   --  2.1   Liver Function Tests:  Recent Labs  Lab 12/28/21 1146  AST 20  ALT 11  ALKPHOS 71  BILITOT 0.9  PROT 7.7  ALBUMIN 3.4*   No results for input(s): LIPASE, AMYLASE in the last 168 hours. No results for input(s): AMMONIA in the last 168 hours. CBC: Recent Labs  Lab 12/28/21 1146 12/29/21 0231  WBC 5.5 6.6  NEUTROABS 3.0  --   HGB 10.7* 9.8*  HCT 35.5* 33.0*  MCV 103.5* 104.1*  PLT 248 225   Cardiac Enzymes: No results for input(s): CKTOTAL, CKMB, CKMBINDEX, TROPONINI in the last 168 hours. BNP: Invalid input(s): POCBNP CBG: Recent Labs  Lab 12/28/21 2333 12/29/21 0640 12/29/21 1240 12/29/21 2059 12/30/21 0653  GLUCAP 155* 119* 94 90 92   D-Dimer No results for input(s): DDIMER in the last 72 hours. Hgb A1c No results for input(s): HGBA1C in the last 72 hours. Lipid Profile No results for input(s): CHOL, HDL, LDLCALC, TRIG, CHOLHDL, LDLDIRECT in the last 72 hours. Thyroid function studies No results for input(s): TSH, T4TOTAL, T3FREE, THYROIDAB in the last 72 hours.  Invalid input(s): FREET3 Anemia work up No results for input(s): VITAMINB12, FOLATE, FERRITIN, TIBC, IRON, RETICCTPCT in the last 72 hours. Urinalysis    Component Value Date/Time   COLORURINE YELLOW 10/10/2019 0754   APPEARANCEUR TURBID (A) 10/10/2019 0754   LABSPEC 1.014 10/10/2019 0754   PHURINE  5.0 10/10/2019 0754   GLUCOSEU NEGATIVE 10/10/2019 0754   HGBUR MODERATE (A) 10/10/2019 0754   BILIRUBINUR Positive 02/01/2020 1507   BILIRUBINUR neg 08/08/2013 0000   KETONESUR NEGATIVE 10/10/2019 0754   PROTEINUR Positive (A) 02/01/2020 1507   PROTEINUR >=300 (A) 10/10/2019 0754   UROBILINOGEN 0.2 02/01/2020 1507   UROBILINOGEN 0.2 09/07/2015 1230   NITRITE Negative 02/01/2020 1507   NITRITE NEGATIVE 10/10/2019 0754   LEUKOCYTESUR Trace (A) 02/01/2020 1507   LEUKOCYTESUR LARGE (A) 10/10/2019 0754   Sepsis Labs Invalid input(s): PROCALCITONIN,  WBC,  LACTICIDVEN Microbiology Recent Results (from the past 240 hour(s))  Resp Panel by RT-PCR (Flu A&B, Covid) Nasopharyngeal Swab     Status: None   Collection Time: 12/28/21  7:00 PM   Specimen: Nasopharyngeal Swab; Nasopharyngeal(NP) swabs in vial transport medium  Result Value Ref Range Status   SARS Coronavirus 2 by RT PCR NEGATIVE NEGATIVE Final    Comment: (NOTE) SARS-CoV-2 target nucleic acids are NOT DETECTED.  The SARS-CoV-2 RNA is generally detectable in upper respiratory specimens during the acute phase of infection. The lowest concentration of SARS-CoV-2 viral copies this assay can detect is 138 copies/mL. A negative result does not preclude SARS-Cov-2 infection and should not be used as the sole basis for treatment or other patient management decisions. A negative result may occur with  improper specimen collection/handling, submission of specimen other than nasopharyngeal swab, presence of viral mutation(s) within the areas targeted by this assay, and inadequate number of viral copies(<138 copies/mL). A negative result must be combined with clinical observations, patient history, and epidemiological information. The expected result is Negative.  Fact Sheet for Patients:  EntrepreneurPulse.com.au  Fact Sheet for Healthcare Providers:  IncredibleEmployment.be  This test is no t  yet approved or cleared by the [place] FDA and  has been authorized for detection and/or diagnosis of SARS-CoV-2 by FDA under an Emergency Use Authorization (EUA). This EUA will remain  in effect (meaning this test can be used) for the duration of the COVID-19 declaration under Section 564(b)(1) of the Act, 21 U.S.C.section 360bbb-3(b)(1), unless the authorization is terminated  or revoked  sooner.       Influenza A by PCR NEGATIVE NEGATIVE Final   Influenza B by PCR NEGATIVE NEGATIVE Final    Comment: (NOTE) The Xpert Xpress SARS-CoV-2/FLU/RSV plus assay is intended as an aid in the diagnosis of influenza from Nasopharyngeal swab specimens and should not be used as a sole basis for treatment. Nasal washings and aspirates are unacceptable for Xpert Xpress SARS-CoV-2/FLU/RSV testing.  Fact Sheet for Patients: EntrepreneurPulse.com.au  Fact Sheet for Healthcare Providers: IncredibleEmployment.be  This test is not yet approved or cleared by the [place] FDA and has been authorized for detection and/or diagnosis of SARS-CoV-2 by FDA under an Emergency Use Authorization (EUA). This EUA will remain in effect (meaning this test can be used) for the duration of the COVID-19 declaration under Section 564(b)(1) of the Act, 21 U.S.C. section 360bbb-3(b)(1), unless the authorization is terminated or revoked.  Performed at [facility], [place] [address], [place], [place] [address]      Time coordinating discharge: Over 30 minutes  SIGNED:   Darliss Cheney, MD  [facility] 12/30/2021, 8:10 AM  If 7PM-7AM, please contact night-coverage [email or web address]

## 2021-12-30 NOTE — Plan of Care (Signed)
  Problem: Education: Goal: Knowledge of General Education information will improve Description Including pain rating scale, medication(s)/side effects and non-pharmacologic comfort measures Outcome: Progressing   

## 2021-12-30 NOTE — Progress Notes (Signed)
Contacted [facility] to make clinic aware pt to d/c today and will resume tomorrow. Pt is on a TTS schedule.   Melven Sartorius Renal Navigator [phone]

## 2021-12-30 NOTE — TOC Progression Note (Signed)
Transition of Care [facility]) - Initial/Assessment Note    Patient Details  Name: Heather [name] MRN: [id number] Date of Birth: 06-05-1971  Transition of [facility]) CM/SW Contact:    Milinda Antis, [place] Phone Number: 12/30/2021, 9:12 AM  Clinical Narrative:                  Transition of Care Department [facility]) has reviewed patient and no TOC needs have been identified at this time. We will continue to monitor patient advancement through interdisciplinary progression rounds. If new patient transition needs arise, please place a TOC consult.         Patient Goals and CMS Choice        Expected Discharge Plan and Services           Expected Discharge Date: 12/30/21                                    Prior Living Arrangements/Services                       Activities of Daily Living Home Assistive Devices/Equipment: Wheelchair ADL Screening (condition at time of admission) Patient's cognitive ability adequate to safely complete daily activities?: Yes Is the patient deaf or have difficulty hearing?: No Does the patient have difficulty seeing, even when wearing glasses/contacts?: No Does the patient have difficulty concentrating, remembering, or making decisions?: No Patient able to express need for assistance with ADLs?: Yes Does the patient have difficulty dressing or bathing?: Yes Independently performs ADLs?: No Communication: Independent Dressing (OT): Needs assistance Is this a change from baseline?: Pre-admission baseline Grooming: Independent Feeding: Independent Bathing: Needs assistance Is this a change from baseline?: Pre-admission baseline Toileting: Needs assistance Is this a change from baseline?: Pre-admission baseline In/Out Bed: Needs assistance Is this a change from baseline?: Pre-admission baseline Walks in Home: Independent with device (comment) Does the patient have difficulty walking or climbing stairs?: Yes Weakness of  Legs: Left Weakness of Arms/Hands: None  Permission Sought/Granted                  Emotional Assessment              Admission diagnosis:  ESRD (end stage renal disease) on dialysis ([place]) [T59.7, C16.3] Complication of vascular access for dialysis, initial encounter [T82.9XXA] ESRD (end stage renal disease) ([place]) [N18.6] Patient Active Problem List   Diagnosis Date Noted   ESRD (end stage renal disease) ([place]) 12/29/2021   ESRD (end stage renal disease) on dialysis ([place]) 12/28/2021   Displacement of vascular dialysis catheter, initial encounter ([place]) 12/18/2021   S/P BKA (below knee amputation), right ([place]) 03/31/2021   Impaired mobility and ADLs 03/25/2021   CVA (cerebral vascular accident) ([place]) 03/04/2021   Pulmonary embolus ([place]) 03/04/2021   Charcot ankle, right 02/23/2021   Hx of cardiac arrest 11/12/2020   Abnormal CT of the chest 09/26/2020   Allergy, unspecified, initial encounter 09/22/2020   Anaphylactic shock, unspecified, initial encounter 09/22/2020   Fluid overload, unspecified 06/10/2020   Other mechanical complication of other cardiac and vascular devices and implants, initial encounter ([place]) 05/10/2020   Positive ANA (antinuclear antibody) 04/16/2020   ESRD on dialysis [facility])    Hemiplegia and hemiparesis following cerebral infarction affecting left non-dominant side ([place]) 12/29/2018   Admission for dialysis and dialysis catheter care [facility]) 11/07/2018   Hyperkalemia 07/21/2018   Hypertensive urgency 12/02/2017  IBS (irritable bowel syndrome) 08/06/2017   Dyslipidemia associated with type 2 diabetes mellitus ([place]) 06/25/2016   Proliferative diabetic retinopathy ([place]) 09/29/2015   Chronic cough 10/04/2014   B12 deficiency 05/10/2014   Anemia in chronic kidney disease, on chronic dialysis ([place]) 04/26/2014   CKD (chronic kidney disease) stage 5, GFR less than 15 ml/min ([place]) 04/26/2014   Secondary hyperparathyroidism of renal origin ([place]) 11/27/2013    Posterior subcapsular cataract, bilateral 10/18/2013   Diabetic neuropathy, painful ([place]) 07/13/2013   Diabetes mellitus, type II, insulin dependent ([place]), on CGM, with end organ damage: renal, neuropathy, gastroparesis, retinopathy 02/19/2011   Hypertension associated with diabetes ([place]) 02/19/2011   Gastroparesis due to DM ([place]) 02/19/2011   PCP:  [name], MD Pharmacy:   [id number] [address], [place] [address] Phone: [phone] Fax: [phone]     Social Determinants of Health (SDOH) Interventions    Readmission Risk Interventions No flowsheet data found.

## 2022-01-15 ENCOUNTER — Telehealth: Payer: Self-pay | Admitting: *Deleted

## 2022-01-15 NOTE — Telephone Encounter (Signed)
Transition Care Management Follow-up Telephone Call Date of discharge and from where: 12/19/21 [facility] How have you been since you were released from the hospital? "Doing well, back at work so I can't talk long"  Any questions or concerns? No  Items Reviewed: Did the pt receive and understand the discharge instructions provided? Yes  Medications obtained and verified? No - patient did not have time to review med list as she was working Other? No  Any new allergies since your discharge? No  Dietary orders reviewed? No - due to time limitations Do you have support at home? Yes   Home Care and Equipment/Supplies: Were home health services ordered? not applicable If so, what is the name of the agency? Not applicable  Has the agency set up a time to come to the patient's home? not applicable Were any new equipment or medical supplies ordered?  No What is the name of the medical supply agency? Not applicable Were you able to get the supplies/equipment? not applicable Do you have any questions related to the use of the equipment or supplies? Not applicable  Functional Questionnaire: (I = Independent and D = Dependent) ADLs: I  Bathing/Dressing- I  Meal Prep- I  Eating- I  Maintaining continence- I  Transferring/Ambulation- I  Managing Meds- I  Follow up appointments reviewed:  PCP Hospital f/u appt confirmed? Yes  Scheduled to see Dimas Chyle . [facility] f/u appt confirmed? Yes  Scheduled to see Dr Augustin Coupe on 01/13/22 - completed Are transportation arrangements needed? No  If their condition worsens, is the pt aware to call PCP or go to the Emergency Dept.? Yes Was the patient provided with contact information for the PCP's office or ED? Yes Was to pt encouraged to call back with questions or concerns? Yes   Kelli Churn RN, CCM, [facility] Care Management Coordinator - Managed [place] [facility] Risk [phone]

## 2022-01-20 ENCOUNTER — Telehealth: Payer: Self-pay | Admitting: Family Medicine

## 2022-01-20 NOTE — Telephone Encounter (Signed)
Attempted to schedule AWV. Unable to LVM.  Will try at later time.  

## 2022-01-26 ENCOUNTER — Other Ambulatory Visit: Payer: Self-pay | Admitting: Sports Medicine

## 2022-01-26 ENCOUNTER — Other Ambulatory Visit: Payer: Self-pay | Admitting: Emergency Medicine

## 2022-02-01 ENCOUNTER — Ambulatory Visit: Payer: Medicare Other | Admitting: Family Medicine

## 2022-02-03 ENCOUNTER — Emergency Department (HOSPITAL_BASED_OUTPATIENT_CLINIC_OR_DEPARTMENT_OTHER): Payer: Medicare Other

## 2022-02-03 ENCOUNTER — Emergency Department (HOSPITAL_BASED_OUTPATIENT_CLINIC_OR_DEPARTMENT_OTHER)
Admission: EM | Admit: 2022-02-03 | Discharge: 2022-02-03 | Disposition: A | Discharge status: Home / Self Care | Payer: Medicare Other | Attending: Emergency Medicine | Admitting: Emergency Medicine

## 2022-02-03 ENCOUNTER — Encounter (HOSPITAL_BASED_OUTPATIENT_CLINIC_OR_DEPARTMENT_OTHER): Payer: Self-pay

## 2022-02-03 ENCOUNTER — Other Ambulatory Visit: Payer: Self-pay

## 2022-02-03 DIAGNOSIS — R072 Precordial pain: Secondary | ICD-10-CM | POA: Diagnosis present

## 2022-02-03 DIAGNOSIS — U071 COVID-19: Secondary | ICD-10-CM | POA: Diagnosis not present

## 2022-02-03 DIAGNOSIS — Z794 Long term (current) use of insulin: Secondary | ICD-10-CM | POA: Diagnosis not present

## 2022-02-03 LAB — CBC WITH DIFFERENTIAL/PLATELET
Abs Immature Granulocytes: 0.04 10*3/uL (ref 0.00–0.07)
Basophils Absolute: 0 10*3/uL (ref 0.0–0.1)
Basophils Relative: 0 %
Eosinophils Absolute: 0.6 10*3/uL — ABNORMAL HIGH (ref 0.0–0.5)
Eosinophils Relative: 8 %
HCT: 37.2 % (ref 36.0–46.0)
Hemoglobin: 11.6 g/dL — ABNORMAL LOW (ref 12.0–15.0)
Immature Granulocytes: 1 %
Lymphocytes Relative: 16 %
Lymphs Abs: 1.3 10*3/uL (ref 0.7–4.0)
MCH: 30.9 pg (ref 26.0–34.0)
MCHC: 31.2 g/dL (ref 30.0–36.0)
MCV: 98.9 fL (ref 80.0–100.0)
Monocytes Absolute: 0.6 10*3/uL (ref 0.1–1.0)
Monocytes Relative: 7 %
Neutro Abs: 5.8 10*3/uL (ref 1.7–7.7)
Neutrophils Relative %: 68 %
Platelets: 164 10*3/uL (ref 150–400)
RBC: 3.76 MIL/uL — ABNORMAL LOW (ref 3.87–5.11)
RDW: 15.1 % (ref 11.5–15.5)
WBC: 8.3 10*3/uL (ref 4.0–10.5)
nRBC: 0 % (ref 0.0–0.2)

## 2022-02-03 LAB — TROPONIN I (HIGH SENSITIVITY)
Troponin I (High Sensitivity): 347 ng/L (ref <18)
Troponin I (High Sensitivity): 304 ng/L (ref <18)

## 2022-02-03 LAB — BASIC METABOLIC PANEL
Anion gap: 13 (ref 5–15)
BUN: 27 mg/dL — ABNORMAL HIGH (ref 6–20)
CO2: 27 mmol/L (ref 22–32)
Calcium: 8.6 mg/dL — ABNORMAL LOW (ref 8.9–10.3)
Chloride: 96 mmol/L — ABNORMAL LOW (ref 98–111)
Creatinine, Ser: 7.88 mg/dL — ABNORMAL HIGH (ref 0.44–1.00)
GFR, Estimated: 6 mL/min — ABNORMAL LOW (ref >60)
Glucose, Bld: 109 mg/dL — ABNORMAL HIGH (ref 70–99)
Potassium: 3.7 mmol/L (ref 3.5–5.1)
Sodium: 136 mmol/L (ref 135–145)

## 2022-02-03 MED ORDER — IOHEXOL 350 MG/ML SOLN
100.0000 mL | Freq: Once | INTRAVENOUS | Status: AC | PRN
  Administered 2022-02-03: 100 mL via INTRAVENOUS

## 2022-02-03 MED ORDER — FENTANYL CITRATE PF 50 MCG/ML IJ SOSY
50.0000 ug | PREFILLED_SYRINGE | Freq: Once | INTRAMUSCULAR | Status: AC
  Administered 2022-02-03: 50 ug via INTRAVENOUS
  Filled 2022-02-03: qty 1

## 2022-02-03 MED ORDER — BENZONATATE 100 MG PO CAPS: 100.0000 mg | ORAL_CAPSULE | Freq: Three times a day (TID) | ORAL | 0 refills | Status: DC

## 2022-02-03 MED ORDER — DICLOFENAC SODIUM 1 % EX GEL: 4.0000 g | Freq: Four times a day (QID) | CUTANEOUS | 0 refills | Status: DC

## 2022-02-03 MED ORDER — MOLNUPIRAVIR 200 MG PO CAPS: 4.0000 | ORAL_CAPSULE | Freq: Two times a day (BID) | ORAL | 0 refills | Status: AC

## 2022-02-03 MED ORDER — FENTANYL CITRATE PF 50 MCG/ML IJ SOSY
50.0000 ug | PREFILLED_SYRINGE | Freq: Once | INTRAMUSCULAR | Status: AC
Start: 2022-02-03 — End: 2022-02-03
  Administered 2022-02-03: 50 ug via INTRAVENOUS
  Filled 2022-02-03: qty 1

## 2022-02-03 NOTE — ED Triage Notes (Addendum)
Pt brought in by husband. Pt having severe chest pain esp with movement and palpation. Pt discharged from [facility] Sunday. Covid symptoms started on Saturday. At home positive test on Sunday. Pt reports CPR performed on Friday Pt had dialysis this morning

## 2022-02-03 NOTE — ED Notes (Signed)
Transported to CT 

## 2022-02-03 NOTE — ED Provider Notes (Signed)
[facility] EMERGENCY DEPARTMENT Provider Note   CSN: [id number] Arrival date & time: 02/03/22  0931     History  Chief Complaint  Patient presents with   Chest Pain    Heather [name] is a 51 y.o. female.  51 yo F with a chief complaint of chest discomfort.  The patient has been coughing and having fevers and loss of sense of taste and smell.  Had a home COVID test that was positive about 3 to 4 days ago.  She feels like the pain is sharp and midsternal.  Worse with deep breathing and coughing.  She denies hemoptysis.  Had a full dialysis session this morning.   Chest Pain     Home Medications Prior to Admission medications   Medication Sig Start Date End Date Taking? Authorizing Provider  diclofenac Sodium (VOLTAREN) 1 % GEL Apply 4 g topically 4 (four) times daily. 02/03/22  Yes Deno Etienne, DO  molnupiravir EUA (LAGEVRIO) 200 MG CAPS capsule Take 4 capsules (800 mg total) by mouth 2 (two) times daily for 5 days. 02/03/22 02/08/22 Yes Deno Etienne, DO  acetaminophen (TYLENOL) 500 MG tablet Take 500 mg by mouth every 6 (six) hours as needed for mild pain.    [provider]  calcium acetate (PHOSLO) 667 MG capsule Take 667 mg by mouth 3 (three) times daily with meals. 08/25/21   [provider]  Continuous Blood Gluc Sensor (FREESTYLE LIBRE 14 DAY SENSOR) MISC 1 patch by Does not apply route every 14 (fourteen) days. 07/10/21   [name], MD  gabapentin (NEURONTIN) 300 MG capsule Take 1 capsule (300 mg total) by mouth 3 (three) times a week. Take after dialysis. Do not take more than three times weekly. 12/21/21   Patrecia Pour, MD  insulin aspart (NOVOLOG FLEXPEN) 100 UNIT/ML FlexPen Inject 6 Units into the skin 3 (three) times daily with meals. Sliding scale 07/16/20   [name], MD  insulin degludec (TRESIBA FLEXTOUCH) 100 UNIT/ML SOPN FlexTouch Pen Inject 0.26 mLs (26 Units total) into the skin at bedtime. Patient not taking: Reported on  12/28/2021 12/26/19   Shamleffer, Melanie Crazier, MD  Insulin Pen Needle 29G X 5MM MISC 1 Device by Does not apply route 4 (four) times daily. 12/26/19   Shamleffer, Melanie Crazier, MD  midodrine (PROAMATINE) 5 MG tablet Take 1 tablet (5 mg total) by mouth See admin instructions. Given at Dialysis if needed 09/17/21   Collene Gobble, MD      Allergies    Ibuprofen, Dilaudid [hydromorphone hcl], Iodinated contrast media, and Tramadol    Review of Systems   Review of Systems  Cardiovascular:  Positive for chest pain.   Physical Exam Updated Vital Signs BP (!) 201/98    Pulse 82    Temp 98.3 F (36.8 C)    Resp 15    Ht 5\' 8"  (1.727 m)    Wt 97.1 kg    LMP  (LMP Unknown)    SpO2 96%    BMI 32.54 kg/m  Physical Exam Vitals and nursing note reviewed.  Constitutional:      General: [name] not in acute distress.    Appearance: [name] well-developed. [name] not diaphoretic.     Comments: Chronically ill-appearing.  HENT:     Head: Normocephalic and atraumatic.  Eyes:     Pupils: Pupils are equal, round, and reactive to light.  Cardiovascular:     Rate and Rhythm: Normal rate and  regular rhythm.     Heart sounds: No murmur heard.   No friction rub. No gallop.  Pulmonary:     Effort: Pulmonary effort is normal.     Breath sounds: No wheezing or rales.  Chest:     Comments: Tunneled dialysis catheter without surrounding induration erythema fluctuance or tenderness. Pain to the sternum reproduces patient's symptoms. Abdominal:     General: There is no distension.     Palpations: Abdomen is soft.     Tenderness: There is no abdominal tenderness.  Musculoskeletal:        General: No tenderness.     Cervical back: Normal range of motion and neck supple.     Comments: Right AKA.  Skin:    General: Skin is warm and dry.  Neurological:     Mental Status: [name] alert and oriented to person, place, and time.  Psychiatric:        Behavior: Behavior normal.    ED Results / Procedures  / Treatments   Labs (all labs ordered are listed, but only abnormal results are displayed) Labs Reviewed  CBC WITH DIFFERENTIAL/PLATELET - Abnormal; Notable for the following components:      Result Value   RBC 3.76 (*)    Hemoglobin 11.6 (*)    Eosinophils Absolute 0.6 (*)    All other components within normal limits  BASIC METABOLIC PANEL - Abnormal; Notable for the following components:   Chloride 96 (*)    Glucose, Bld 109 (*)    BUN 27 (*)    Creatinine, Ser 7.88 (*)    Calcium 8.6 (*)    GFR, Estimated 6 (*)    All other components within normal limits  TROPONIN I (HIGH SENSITIVITY) - Abnormal; Notable for the following components:   Troponin I (High Sensitivity) 347 (*)    All other components within normal limits  TROPONIN I (HIGH SENSITIVITY) - Abnormal; Notable for the following components:   Troponin I (High Sensitivity) 304 (*)    All other components within normal limits    EKG EKG Interpretation  Date/Time:  Wednesday February 03 2022 09:47:10 EST Ventricular Rate:  92 PR Interval:  142 QRS Duration: 91 QT Interval:  378 QTC Calculation: 468 R Axis:   58 Text Interpretation: Sinus rhythm Low voltage, precordial leads Probable anteroseptal infarct, old No significant change since last tracing Confirmed by Deno Etienne [phone]) on 02/03/2022 10:17:35 AM  Radiology CT Angio Chest PE W and/or Wo Contrast  Result Date: 02/03/2022 CLINICAL DATA:  Chest pain for the past 5 days. Dialysis scheduled for this morning. EXAM: CT ANGIOGRAPHY CHEST WITH CONTRAST TECHNIQUE: Multidetector CT imaging of the chest was performed using the standard protocol during bolus administration of intravenous contrast. Multiplanar CT image reconstructions and MIPs were obtained to evaluate the vascular anatomy. RADIATION DOSE REDUCTION: This exam was performed according to the departmental dose-optimization program which includes automated exposure control, adjustment of the mA and/or kV according  to patient size and/or use of iterative reconstruction technique. CONTRAST:  167mL OMNIPAQUE IOHEXOL 350 MG/ML SOLN COMPARISON:  07/29/2021 FINDINGS: Cardiovascular: Interval increase in size of the previously demonstrated pericardial effusion. This currently measures 2.6 cm in thickness. Normal sized heart. Normally opacified pulmonary arteries with no pulmonary arterial filling defects seen. Atheromatous calcifications, including the coronary arteries and aorta. Mediastinum/Nodes: No enlarged mediastinal, hilar, or axillary lymph nodes. Thyroid gland, trachea, and esophagus demonstrate no significant findings. Lungs/Pleura: Small to moderate-sized left pleural effusion and small right pleural effusion.  Bilateral lower lobe atelectasis, greater on the left. Mild lingular and bilateral upper lobe atelectasis. Upper Abdomen: Unremarkable. Musculoskeletal: Mild to moderate levoconvex thoracic scoliosis. Thoracic and lower cervical spine degenerative changes. Review of the MIP images confirms the above findings. IMPRESSION: 1. No pulmonary emboli. 2. Increased size of a moderate sized pericardial effusion. 3. Interval small to moderate-sized left pleural effusion and small right pleural effusion. 4. Interval bilateral atelectasis, most pronounced in the left lower lobe. 5. Mild calcific coronary artery and aortic atherosclerosis. Aortic Atherosclerosis (ICD10-I70.0). Electronically Signed   By: Claudie Revering M.D.   On: 02/03/2022 11:03    Procedures Procedures    Medications Ordered in ED Medications  fentaNYL (SUBLIMAZE) injection 50 mcg (has no administration in time range)  fentaNYL (SUBLIMAZE) injection 50 mcg (50 mcg Intravenous Given 02/03/22 1015)  iohexol (OMNIPAQUE) 350 MG/ML injection 100 mL (100 mLs Intravenous Contrast Given 02/03/22 1029)    ED Course/ Medical Decision Making/ A&P                           Medical Decision Making Amount and/or Complexity of Data Reviewed Labs:  ordered. Radiology: ordered.  Risk Prescription drug management.   Patient is a 51 y.o. female with a cc of chest pain.  Going on for the past couple days.  Cough, fever.  Patient with home covid test +.  I reviewed the patient's medical record and she unfortunately was just at [facility] having a procedure to have graft placements and coded on the table.  Had about 2 minutes of CPR and spent a couple days in the cardiac ICU.  She denied any coughing while in the hospital.  Had dialysis done today.  I was concerned for the possibility of pulmonary embolism and so pursed a further workup.  The patient does have a listed allergy to IV contrast dye and has not had any contrasted studies done in our system.  However she has had multiple angiograms done at both [facility] and [facility].  No documentation that I can find states that she was pretreated prior to these studies.  I discussed this with the patient who also denies ever having steroids prior to having a CT angiogram.  I discussed this with Dr. Jobe Igo, radiology after some discussion we felt that the benefits outweighed the risks.  The patient's hemoglobin appears to be at baseline.  No leukocytosis.  No hyperkalemia no acidosis.  Initial troponin elevated at 350.  Not sure of the exact etiology of this could be due to acute pain or could be due to her event that she had in the hospital on my record review [facility] never checked a troponin level on her.  CT angiogram of the chest consistent with what was seen at [facility], no pulmonary embolism slightly enlarged pericardial effusion.  Repeat troponin has come down almost 50 points.  I think most likely the scenario that she had had her cardiac arrest and cause some troponin elevation that is progressively going down is the most likely cause of this.  Will have her follow-up with her family doctor.  After discussion with pharmacist we will start her on oral COVID therapy.   1:51 PM:  I have discussed the  diagnosis/risks/treatment options with the patient and family.  Evaluation and diagnostic testing in the emergency department does not suggest an emergent condition requiring admission or immediate intervention beyond what has been performed at this time.  They will follow  up with  PCP. We also discussed returning to the ED immediately if new or worsening sx occur. We discussed the sx which are most concerning (e.g., sudden worsening pain, fever, inability to tolerate by mouth) that necessitate immediate return. Medications administered to the patient during their visit and any new prescriptions provided to the patient are listed below.  Medications given during this visit Medications  fentaNYL (SUBLIMAZE) injection 50 mcg (has no administration in time range)  fentaNYL (SUBLIMAZE) injection 50 mcg (50 mcg Intravenous Given 02/03/22 1015)  iohexol (OMNIPAQUE) 350 MG/ML injection 100 mL (100 mLs Intravenous Contrast Given 02/03/22 1029)     The patient appears reasonably screen and/or stabilized for discharge and I doubt any other medical condition or other [facility] requiring further screening, evaluation, or treatment in the ED at this time prior to discharge.           Final Clinical Impression(s) / ED Diagnoses Final diagnoses:  COVID-19 virus infection    Rx / DC Orders ED Discharge Orders          Ordered    molnupiravir EUA (LAGEVRIO) 200 MG CAPS capsule  2 times daily        02/03/22 1345    diclofenac Sodium (VOLTAREN) 1 % GEL  4 times daily        02/03/22 [place], Prospero Mahnke, DO 02/03/22 1351

## 2022-02-03 NOTE — Discharge Instructions (Addendum)
Follow-up with your family doctor and cardiologist in the office.  I have prescribed you a medicine that there is believed to have a benefit of trying to keep you out of the hospital with [place]. Please return for worsening pain, fever

## 2022-02-18 ENCOUNTER — Telehealth: Payer: Self-pay | Admitting: Family Medicine

## 2022-02-18 ENCOUNTER — Inpatient Hospital Stay: Payer: Medicare Other | Admitting: Family Medicine

## 2022-02-18 ENCOUNTER — Other Ambulatory Visit: Payer: Self-pay

## 2022-02-18 NOTE — Telephone Encounter (Signed)
FYI: Pt was about 20 mins late for their appt today. Told them we need to reschedule and they just walked out.

## 2022-02-18 NOTE — Progress Notes (Incomplete)
° °  Heather [name] is a 51 y.o. female who presents today for an office visit.  Assessment/Plan:  New/Acute Problems: ***  Chronic Problems Addressed Today: No problem-specific Assessment & Plan notes found for this encounter.     Subjective:  HPI:  Patient here for ED follow up. She presented to the ED on 02/03/2022 with chest discomfort.        Objective:  Physical Exam: LMP  (LMP Unknown)   Gen: No acute distress, resting comfortably*** CV: Regular rate and rhythm with no murmurs appreciated Pulm: Normal work of breathing, clear to auscultation bilaterally with no crackles, wheezes, or rhonchi Neuro: Grossly normal, moves all extremities Psych: Normal affect and thought content       I,Heather [name],acting as a scribe for Heather Chyle, MD.,have documented all relevant documentation on the behalf of Heather Chyle, MD,as directed by  Heather Chyle, MD while in the presence of Heather Chyle, MD.   *** Heather [name]. Heather Pain, MD 02/18/2022 7:56 AM

## 2022-02-25 ENCOUNTER — Encounter: Payer: Self-pay | Admitting: Family Medicine

## 2022-02-25 ENCOUNTER — Ambulatory Visit (INDEPENDENT_AMBULATORY_CARE_PROVIDER_SITE_OTHER): Payer: Medicare Other | Admitting: Family Medicine

## 2022-02-25 ENCOUNTER — Other Ambulatory Visit: Payer: Self-pay

## 2022-02-25 VITALS — BP 137/80 | HR 83 | Temp 98.6°F | Ht 68.0 in | Wt 214.0 lb

## 2022-02-25 DIAGNOSIS — N186 End stage renal disease: Secondary | ICD-10-CM | POA: Diagnosis not present

## 2022-02-25 DIAGNOSIS — T82868A Thrombosis of vascular prosthetic devices, implants and grafts, initial encounter: Secondary | ICD-10-CM

## 2022-02-25 DIAGNOSIS — Z8674 Personal history of sudden cardiac arrest: Secondary | ICD-10-CM

## 2022-02-25 DIAGNOSIS — E119 Type 2 diabetes mellitus without complications: Secondary | ICD-10-CM | POA: Diagnosis not present

## 2022-02-25 DIAGNOSIS — R0789 Other chest pain: Secondary | ICD-10-CM

## 2022-02-25 DIAGNOSIS — I2583 Coronary atherosclerosis due to lipid rich plaque: Secondary | ICD-10-CM

## 2022-02-25 DIAGNOSIS — I639 Cerebral infarction, unspecified: Secondary | ICD-10-CM

## 2022-02-25 DIAGNOSIS — I2510 Atherosclerotic heart disease of native coronary artery without angina pectoris: Secondary | ICD-10-CM

## 2022-02-25 DIAGNOSIS — Z794 Long term (current) use of insulin: Secondary | ICD-10-CM

## 2022-02-25 MED ORDER — NOVOLOG FLEXPEN 100 UNIT/ML ~~LOC~~ SOPN: 6.0000 [IU] | PEN_INJECTOR | Freq: Three times a day (TID) | SUBCUTANEOUS | Status: DC

## 2022-02-25 MED ORDER — FREESTYLE LIBRE 14 DAY SENSOR MISC: 1.0000 | 5 refills | Status: DC

## 2022-02-25 MED ORDER — PREDNISONE 20 MG PO TABS: 20.0000 mg | ORAL_TABLET | Freq: Every day | ORAL | 0 refills | Status: DC

## 2022-02-25 MED ORDER — GABAPENTIN 300 MG PO CAPS: 300.0000 mg | ORAL_CAPSULE | ORAL | 3 refills | Status: DC

## 2022-02-25 MED ORDER — TRESIBA FLEXTOUCH 100 UNIT/ML ~~LOC~~ SOPN: 26.0000 [IU] | PEN_INJECTOR | Freq: Every day | SUBCUTANEOUS | 3 refills | Status: DC

## 2022-02-25 NOTE — Assessment & Plan Note (Signed)
Incidentally found on echocardiogram while at [facility].  She was started on 41-month course of Eliquis and will need follow-up echocardiogram at that time. ?

## 2022-02-25 NOTE — Assessment & Plan Note (Addendum)
Recently hospitalized at [facility] for 3-minute PEA arrest.  This was thought to be iatrogenic related to placement of a catheter into her IVC during her surgical procedure for dialysis graft revision.  Was in the ED a few weeks ago and had a CT chest scan that did show some coronary artery calcifications and well as elevated troponins.  Will place referral to cardiology for further evaluation - do not see where she has been referred to cardiology in the past or has had any ischemic work up.  Discussed reasons to return to care and seek emergent care. [name] on statin.   ?

## 2022-02-25 NOTE — Patient Instructions (Signed)
It was very nice to see you today! ? ?I think you are probably having some chest wall pain due to the compressions he received during her hospitalization.  Please try the prednisone for a week.  Let me know if not improving and we can repeat an x-ray or CT scan. ? ? ?I will refer you to see a cardiologist. ? ?Please come back in 3 to 6 months.  Come back sooner if needed. ? ?Take care, ?Dr Jerline Pain ? ?PLEASE NOTE: ? ?If you had any lab tests please let us know if you have not heard back within a few days. You may see your results on mychart before we have a chance to review them but we will give you a call once they are reviewed by [name]. If we ordered any referrals today, please let us know if you have not heard from their office within the next week.  ? ?Please try these tips to maintain a healthy lifestyle: ? ?Eat at least 3 REAL meals and 1-2 snacks per day.  Aim for no more than 5 hours between eating.  If you eat breakfast, please do so within one hour of getting up.  ? ?Each meal should contain half fruits/vegetables, one quarter protein, and one quarter carbs (no bigger than a computer mouse) ? ?Cut down on sweet beverages. This includes juice, soda, and sweet tea.  ? ?Drink at least 1 glass of water with each meal and aim for at least 8 glasses per day ? ?Exercise at least 150 minutes every week.   ?

## 2022-02-25 NOTE — Assessment & Plan Note (Signed)
Last A1c 5.4 in the hospital.  Her reported sugars have been at goal as well.  Continue Tresiba 26 units daily and NovoLog 6 unit 3 times daily with meals as needed.  Will refill her glucometers today.  Follow-up in 3 to 6 months to recheck A1c. ?

## 2022-02-25 NOTE — Progress Notes (Signed)
? ?Heather [name] is a 51 y.o. female who presents today for an office visit. ? ?Assessment/Plan:  ?New/Acute Problems: ?Chest Wall Pain ?Reproducible on exam.  Consistent with musculoskeletal pain likely secondary to 3 minutes of chest compressions during her recent cardiac arrest.  Will be referred to cardiology for ischemic work-up as below but will treat her musculoskeletal chest pain with a course of prednisone.  She will let me know if not improving and we could consider reimaging at that time versus referral to PT or sports med. ? ?Chronic Problems Addressed Today: ?Hx of cardiac arrest ?Recently hospitalized at [facility] for 3-minute PEA arrest.  This was thought to be iatrogenic related to placement of a catheter into her IVC during her surgical procedure for dialysis graft revision.  Was in the ED a few weeks ago and had a CT chest scan that did show some coronary artery calcifications and well as elevated troponins.  Will place referral to cardiology for further evaluation - do not see where she has been referred to cardiology in the past or has had any ischemic work up.  Discussed reasons to return to care and seek emergent care. [name] on statin.   ? ?Diabetes mellitus, type II, insulin dependent ([place]), on CGM, with end organ damage: renal, neuropathy, gastroparesis, retinopathy ?Last A1c 5.4 in the hospital.  Her reported sugars have been at goal as well.  Continue Tresiba 26 units daily and NovoLog 6 unit 3 times daily with meals as needed.  Will refill her glucometers today.  Follow-up in 3 to 6 months to recheck A1c. ? ?Thrombus of venous dialysis catheter ([place]) ?Incidentally found on echocardiogram while at [facility].  She was started on 56-month course of Eliquis and will need follow-up echocardiogram at that time. ? ? ?  ?Subjective:  ?HPI: ? ?Patient here for hospitalization and ED follow-up.  She was at [facility] on 01/29/2022 for AV fistula with graft placement.  During procedure she underwent PEA  arrest.  This was thought to be due to hypotensive episode related to insertion of a 22 French catheter into her IVC.  She had 3 minutes of compression with epinephrine x2 and an amp of bicarb.  Pulses were regained.  She was admitted for further management.  She initially did have some acidosis after her arrest.  After sedation wore off she had a normal neurologic exam.  She was incidentally found to have a small thrombus at the tip of her permacath on her TEE.  She was started on 3 months of Eliquis 5 mg twice daily.  She was discharged home on 01/31/2022.  Few days after she went back to the ED with chest discomfort.  Her troponins were elevated though downtrending on repeat.  She had a CT scan done that showed pericardial effusion but no other acute findings.  She was discharged home. ? ?Over the last couple of weeks she has continued to have chest wall pain.  Is there nearly persistently.  Worse with any motion.  Worse with coughing and sneezing.  No shortness of breath.  Located on the front part of her chest bilaterally.  ? ? ? ?   ?  ?Objective:  ?Physical Exam: ?BP 137/80 (BP Location: Right Arm)   Pulse 83   Temp 98.6 ?F (37 ?C) (Temporal)   Ht 5\' 8"  (1.727 m)   Wt 214 lb (97.1 kg)   LMP  (LMP Unknown)   SpO2 98%   BMI 32.54 kg/m?   ?Gen:  No acute distress, resting comfortably ?CV: Regular rate and rhythm with no murmurs appreciated ?Pulm: Normal work of breathing, clear to auscultation bilaterally with no crackles, wheezes, or rhonchi ?MSK: ?- chest: No deformities.  Tenderness palpation along sternal border bilaterally. This reproduces her pain ?Neuro: Grossly normal, moves all extremities ?Psych: Normal affect and thought content ? ?   ? ? ?I,Savera Zaman,acting as a Education administrator for Dimas Chyle, MD.,have documented all relevant documentation on the behalf of Dimas Chyle, MD,as directed by  Dimas Chyle, MD while in the presence of Dimas Chyle, MD.  ? ?I, Dimas Chyle, MD, have reviewed all  documentation for this visit. The documentation on 02/25/22 for the exam, diagnosis, procedures, and orders are all accurate and complete. ? ?Time Spent: ?50 minutes of total time was spent on the date of the encounter performing the following actions: chart review prior to seeing the patient including her recent hospitalization and ED visit, obtaining history, performing a medically necessary exam, counseling on the treatment plan, placing orders, and documenting in our EHR.  ? ?[name]. Jerline Pain, MD ?02/25/2022 12:34 PM  ? ?

## 2022-03-02 ENCOUNTER — Ambulatory Visit (INDEPENDENT_AMBULATORY_CARE_PROVIDER_SITE_OTHER): Payer: Medicare Other | Admitting: Internal Medicine

## 2022-03-02 ENCOUNTER — Encounter: Payer: Self-pay | Admitting: Internal Medicine

## 2022-03-02 ENCOUNTER — Other Ambulatory Visit: Payer: Self-pay

## 2022-03-02 VITALS — BP 142/80 | HR 84 | Ht 69.0 in | Wt 214.0 lb

## 2022-03-02 DIAGNOSIS — E1169 Type 2 diabetes mellitus with other specified complication: Secondary | ICD-10-CM | POA: Diagnosis not present

## 2022-03-02 DIAGNOSIS — I8290 Acute embolism and thrombosis of unspecified vein: Secondary | ICD-10-CM

## 2022-03-02 DIAGNOSIS — E785 Hyperlipidemia, unspecified: Secondary | ICD-10-CM | POA: Diagnosis not present

## 2022-03-02 DIAGNOSIS — I3139 Other pericardial effusion (noninflammatory): Secondary | ICD-10-CM | POA: Diagnosis not present

## 2022-03-02 NOTE — Patient Instructions (Signed)
Medication Instructions:  ?No Changes In Medications at this time.  ?*If you need a refill on your cardiac medications before your next appointment, please call your pharmacy* ? ?Lab Work: ?LIPIDS- TODAY  ?If you have labs (blood work) drawn today and your tests are completely normal, you will receive your results only by: ?MyChart Message (if you have MyChart) OR ?A paper copy in the mail ?If you have any lab test that is abnormal or we need to change your treatment, we will call you to review the results. ? ?Testing/Procedures: ?Your physician has requested that you have an echocardiogram IN MAY 2023. Echocardiography is a painless test that uses sound waves to create images of your heart. It provides your doctor with information about the size and shape of your heart and how well your heart?s chambers and valves are working. You may receive an ultrasound enhancing agent through an IV if needed to better visualize your heart during the echo.This procedure takes approximately one hour. There are no restrictions for this procedure. This will take place at the [address], [address].  ? ?Follow-Up: ?At [facility], you and your health needs are our priority.  As part of our continuing mission to provide you with exceptional heart care, we have created designated Provider Care Teams.  These Care Teams include your primary Cardiologist (physician) and Advanced Practice Providers (APPs -  Physician Assistants and Nurse Practitioners) who all work together to provide you with the care you need, when you need it. ? ?Your next appointment:   ?3 month(s)  ? ?The format for your next appointment:   ?In Person ? ?Provider:   ?[name], MD   ? ?

## 2022-03-02 NOTE — Progress Notes (Signed)
Cardiology Office Note:    Date:  03/02/2022   ID:  Heather [name], DOB 1971/04/28, MRN [id number]  PCP:  [name], MD   [facility] Providers Cardiologist:  [name], MD     Referring MD: [name], MD   No chief complaint on file. RA thrombus/Pericardial Effusion  History of Present Illness:    Heather [name] is a 51 y.o. female with a hx of ESRD on TTS HD via HD cath, history of CVA, insulin-dependent T2DM, HTN, Charcot foot s/p right BKA, history of PE/DVT , MWF [facility],  anemia of chronic kidney disease  referral for pericardial effusion, and hx of 3 minutes of PEA arrest during IHD catheter placement 01/2022, RA thrombus associated with IHD cather, MSK pain post chest compressions  She's been hospitalized a few times in the last 6 months. She had a dislodged HD catheter on 12/23. K was 6 at that time. CXR confirmed retracted left-sided dialysis catheter with tip extravascular in chest wall. IR was consulted 12/24 for urgent HD catheter placement and dialysis same day.   In January , she had malfunctioning HD catheter and missed to HD sessions. She had a catheter exchange of the left IJ catheter for temporary use on 12/29/2021.  In early February, she went to [facility] for elective HERO graft surgery. She started the procedure and  after 60F catheter was inserted into the IVC, she developed PEA arrest with 3 minutes of chest compressions, epix2. She achieved ROSC. She had intra-op TEE showing pericardial effusion but no tamponade. TEE also noted RA mass c/f thrombus. She was started on eliquis. Lactate went up to 7.   Has not seen cardiology. She does not have heart disease. She had a lexiscan stress test a long time ago. No heart catheterization. She denies CP, shortness of breath. On her CT PE, cac was noted on non gated scan.   [name] still on eliquis 5 mg BID. She started it on Februrary 15th, 2023. The catheter was not replaced.   On crestor 10 mg  daily. No recent LDL.  Family Hx: father has heart disease s/p stent. No heart disease history. Siblings- brother had MI; passed away, and older sister  Social Hx: no smoking hx. She's mainly in her wheelchair. She works from home. She was here with her husband  Cardiology Studies: At [facility] 01/29/2022- TEE: Large venous sheath visible from SVC traversing the RA and entering the IVC. Large circumferential pericardial effusion without evidence of tamponade. No PFO. Grossly normal mitral, aortic, pulmonic, and tricuspid valve function.   01/29/2022 - limited echo- POSSIBLE MASS ATTACHED TO RA PORT CATH, DOSEN'T UPTAKE DEFINITY CONTRAST    WHEN GIVEN SUGGESTING THROMBUS OR ENDOCARDITIS.     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[place]), nonhemorrhagic, inferior right cerebellum 12/03/2017   left side weakness in leg   Cyst of left ovary 01/12/2018   Diabetic neuropathy, painful ([place]), on low dose Gabapentin 07/13/2013   DM (diabetes mellitus), type 2, uncontrolled, with renal complications 47/65/4650   DM2 (diabetes mellitus, type 2) ([place])    Dysfunction of left eustachian tube, with pusatile tinnitus 11/24/2017   Dyslipidemia associated with type 2 diabetes mellitus ([place]) 06/25/2016   ESRD (end stage renal disease) on dialysis ([place])    On  hemodialysis in July 2019 via [facility] then switched to [facility] in Nov 2019.    ESRD with anemia (HCC)    Fibromyalgia    Gastroparesis due to DM    GERD (gastroesophageal reflux disease)    Headache    Hypertension associated with diabetes ([place]) 02/19/2011   IBS (irritable bowel syndrome)    Left-sided weakness 12/10/2017   .   Leiomyoma of uterus    Pancreatitis    PE (pulmonary thromboembolism) ([place]) 11/04/2019   Pneumonia    Proliferative diabetic retinopathy ([place])  09/29/2015   Pronation deformity of both feet 06/14/2014   Reactive depression 12/10/2017   .   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[place]) 02/17/2018   Thromboembolism ([place]) 04/05/2018   Upper airway cough syndrome, with recs to stay off ACE and take Pepcid q hs 11/15/2016    Past Surgical History:  Procedure Laterality Date   A/V FISTULAGRAM Right 03/03/2020   Procedure: Venogram;  Surgeon: Waynetta Sandy, MD;  Location: [facility];  Service: Cardiovascular;  Laterality: Right;   ARTERY REPAIR Right 02/17/2018   Procedure: EXPLORATION OF RIGHT BRACHIAL ARTERY;  Surgeon: Conrad [place], MD;  Location: [place];  Service: Vascular;  Laterality: Right;   ARTERY REPAIR Right 02/17/2018   Procedure: BRACHIAL ARTERY EXPLORATION AND TRHOMBECTOMY;  Surgeon: Conrad [place], MD;  Location: [place];  Service: Vascular;  Laterality: Right;   AV FISTULA PLACEMENT Left 05/09/2017   Procedure: INSERTION OF ARTERIOVENOUS (AV) GRAFT ARM (ARTEGRAFT);  Surgeon: Conrad [place]illo, MD;  Location: [place];  Service: Vascular;  Laterality: Left;   AV FISTULA PLACEMENT Right 02/17/2018   Procedure: INSERTION OF ARTERIOVENOUS (AV) GORE-TEX GRAFT ARM RIGHT UPPER ARM;  Surgeon: Conrad [place], MD;  Location: [place];  Service: Vascular;  Laterality: Right;   AV FISTULA PLACEMENT Left 10/10/2019   Procedure: INSERTION OF LEFT ARTERIOVENOUS (AV) ARTEGRAFT GRAFT ARM;  Surgeon: Waynetta Sandy, MD;  Location: [place];  Service: Vascular;  Laterality: Left;   AV FISTULA PLACEMENT Right 03/19/2020   Procedure: INSERTION OF ARTERIOVENOUS (AV) GORE-TEX GRAFT IN RIGHT ARM AND VIABAHN STENT IN RIGHT AXILLARY ARTERY;  Surgeon: Waynetta Sandy, MD;  Location: [place];  Service: Vascular;  Laterality: Right;   [place] Left 08/31/2016   Procedure: BASILIC VEIN TRANSPOSITION FIRST STAGE;  Surgeon: Conrad Hondah, MD;  Location: [place];  Service: Vascular;  Laterality: Left;   BRONCHIAL WASHINGS  09/17/2021   Procedure: BRONCHIAL WASHINGS;  Surgeon: Collene Gobble, MD;  Location: [facility];  Service: Cardiopulmonary;;   CENTRAL VENOUS CATHETER INSERTION Left 07/24/2018   Procedure: INSERTION CENTRAL LINE ADULT;  Surgeon: Conrad [place], MD;  Location: [place];  Service: Vascular;  Laterality: Left;   EYE SURGERY     secondary to diabetic retinopathy    FOOT SURGERY Right    t"ook bone out- maybe hammer toe"   INSERTION OF DIALYSIS CATHETER Left 07/24/2018   Procedure: INSERTION OF TUNNELED DIALYSIS CATHETER;  Surgeon: Conrad [place], MD;  Location: [place];  Service: Vascular;  Laterality: Left;   IR FLUORO GUIDE CV LINE LEFT  12/19/2021   IR FLUORO GUIDE CV LINE LEFT  12/29/2021   IR FLUORO GUIDE CV LINE RIGHT  07/21/2018   IR FLUORO GUIDE CV LINE RIGHT  06/01/2019   IR FLUORO GUIDE CV LINE RIGHT  05/05/2020   IR REMOVAL  TUN CV CATH W/O FL  01/23/2021   IR REMOVAL TUN CV CATH W/O FL  12/19/2021   IR US GUIDE VASC ACCESS LEFT  12/19/2021   IR US GUIDE VASC ACCESS RIGHT  07/21/2018   IR US GUIDE VASC ACCESS RIGHT  06/01/2019   LIGATION ARTERIOVENOUS GORTEX GRAFT Right 03/20/2020   Procedure: LIGATION ARM ARTERIOVENOUS GORTEX GRAFT With Patch Angioplasty, Thrombectomy;  Surgeon: Waynetta Sandy, MD;  Location: [place];  Service: Vascular;  Laterality: Right;   LIGATION OF ARTERIOVENOUS  FISTULA Left 05/09/2017   Procedure: LIGATION OF ARTERIOVENOUS  FISTULA;  Surgeon: Conrad [place], MD;  Location: [place];  Service: Vascular;  Laterality: Left;   LOOP RECORDER INSERTION N/A 12/05/2017   Procedure: LOOP RECORDER INSERTION;  Surgeon: Evans Lance, MD;  Location: [facility] [facility];  Service: Cardiovascular;  Laterality: N/A;   MYOMECTOMY     REMOVAL OF GRAFT Right 02/17/2018   Procedure: REMOVAL OF RIGHT UPPER ARM ARTERIOVENOUS GRAFT;  Surgeon: Conrad [place]len, MD;  Location: [place];  Service: Vascular;   Laterality: Right;   REVISION OF ARTERIOVENOUS GORETEX GRAFT Left 07/24/2018   Procedure: REDO ARTERIOVENOUS GORETEX GRAFT;  Surgeon: Conrad [place], MD;  Location: [place];  Service: Vascular;  Laterality: Left;   TEE WITHOUT CARDIOVERSION N/A 12/05/2017   Procedure: TRANSESOPHAGEAL ECHOCARDIOGRAM (TEE);  Surgeon: Sanda Klein, MD;  Location: [place];  Service: Cardiovascular;  Laterality: N/A;   UPPER EXTREMITY VENOGRAPHY Left 07/13/2018   Procedure: UPPER EXTREMITY VENOGRAPHY - Central & Left Arm;  Surgeon: Conrad [place], MD;  Location: [place] [facility];  Service: Cardiovascular;  Laterality: Left;   UPPER EXTREMITY VENOGRAPHY Bilateral 09/10/2019   Procedure: UPPER EXTREMITY VENOGRAPHY;  Surgeon: Waynetta Sandy, MD;  Location: [place] [facility];  Service: Cardiovascular;  Laterality: Bilateral;   UTERINE FIBROID SURGERY     VIDEO BRONCHOSCOPY N/A 09/17/2021   Procedure: VIDEO BRONCHOSCOPY WITHOUT FLUORO;  Surgeon: Collene Gobble, MD;  [facility];  Service: Cardiopulmonary;  Laterality: N/A;   VITRECTOMY Bilateral     Current Medications: Current Meds  Medication Sig   acetaminophen (TYLENOL) 500 MG tablet Take 500 mg by mouth every 6 (six) hours as needed for mild pain.   calcium acetate (PHOSLO) 667 MG capsule Take 667 mg by mouth 3 (three) times daily with meals.   Continuous Blood Gluc Sensor (FREESTYLE LIBRE 14 DAY SENSOR) MISC 1 patch by Does not apply route every 14 (fourteen) days.   ELIQUIS 5 MG TABS tablet SMARTSIG:1 Tablet(s) By Mouth Every 12 Hours   gabapentin (NEURONTIN) 300 MG capsule Take 1 capsule (300 mg total) by mouth 3 (three) times a week. Take after dialysis. Do not take more than three times weekly.   insulin aspart (NOVOLOG FLEXPEN) 100 UNIT/ML FlexPen Inject 6 Units into the skin 3 (three) times daily with meals. Sliding scale   insulin degludec (TRESIBA FLEXTOUCH) 100 UNIT/ML FlexTouch Pen Inject 26 Units into the skin at bedtime.    Insulin Pen Needle 29G X 5MM MISC 1 Device by Does not apply route 4 (four) times daily.   midodrine (PROAMATINE) 5 MG tablet Take 1 tablet (5 mg total) by mouth See admin instructions. Given at Dialysis if needed   predniSONE (DELTASONE) 20 MG tablet Take 1 tablet (20 mg total) by mouth daily with breakfast.     Allergies:   Ibuprofen, Dilaudid [hydromorphone hcl], Iodinated contrast media, and Tramadol   Social History   Socioeconomic History  Marital status: Married    Spouse name: Not on file   Number of children: 1   Years of education: college   Highest education level: Not on file  Occupational History   Occupation: customer service rep    Employer: [employer]  Tobacco Use   Smoking status: Never   Smokeless tobacco: Never  Vaping Use   Vaping Use: Never used  Substance and Sexual Activity   Alcohol use: No    Alcohol/week: 0.0 standard drinks   Drug use: No   Sexual activity: Not Currently    Partners: Male    Birth control/protection: I.U.D.  Other Topics Concern   Not on file  Social History Narrative   Patient lives with fiance. She has 1 grown son. Works full-time, she Paramedic for attorney.   Caffeine Use: 2 cups daily; sodas occasionally   She has 7 grandchildren   Social Determinants of Radio broadcast assistant Strain: Not on file  Food Insecurity: Not on file  Transportation Needs: Not on file  Physical Activity: Not on file  Stress: Not on file  Social Connections: Not on file     Family History: The patient's family history includes Colon polyps in her mother; Diabetes in her brother, mother, sister, and sister; Heart murmur in her father and son; Hypertension in her father; Kidney failure in her sister; Retinal degeneration in her brother; Stroke in her paternal grandmother.  ROS:   Please see the history of present illness.     All other systems reviewed and are negative.  EKGs/Labs/Other Studies Reviewed:    The following  studies were reviewed today:   EKG:  EKG is  ordered today.  The ekg ordered today demonstrates   NSR, low voltage, poor R wave progression  Recent Labs: 12/28/2021: ALT 11 12/30/2021: Magnesium 2.1 02/03/2022: BUN 27; Creatinine, Ser 7.88; Hemoglobin 11.6; Platelets 164; Potassium 3.7; Sodium 136   Recent Lipid Panel    Component Value Date/Time   CHOL 249 (H) 12/03/2017 0409   CHOL 277 02/13/2013 0000   TRIG 158 (H) 12/03/2017 0409   TRIG 200 02/13/2013 0000   HDL 48 12/03/2017 0409   CHOLHDL 5.2 12/03/2017 0409   VLDL 32 12/03/2017 0409   LDLCALC 169 (H) 12/03/2017 0409   LDLCALC 195 02/13/2013 0000   LDLDIRECT 120.0 04/17/2015 1141     Risk Assessment/Calculations:           Physical Exam:    VS:   Vitals:   03/02/22 0946  BP: (!) 142/80  Pulse: 84  SpO2: 92%     Wt Readings from Last 3 Encounters:  03/02/22 214 lb (97.1 kg)  02/25/22 214 lb (97.1 kg)  02/03/22 214 lb (97.1 kg)     GEN:  Well nourished, well developed in no acute distress HEENT: Normal NECK: No JVD; No carotid bruits.  LIJ catheter in place LYMPHATICS: No lymphadenopathy CARDIAC: RRR, no murmurs, rubs, gallops RESPIRATORY:  Clear to auscultation without rales, wheezing or rhonchi  ABDOMEN: non-distended MUSCULOSKELETAL:  No LE edema. R BKA SKIN: Warm and dry NEUROLOGIC:  Alert and oriented x 3 PSYCHIATRIC:  Normal affect   ASSESSMENT:    1. Thrombus   2. Pericardial effusion   3. Dyslipidemia associated with type 2 diabetes mellitus (HCC)   RA thrombus associated with IHD catheter. Seen on TTE at [facility] in February. Will repeat echo in May for anticoagulation planning.  #Pericardial effusion: thought uremic in the setting of ESRD/dialysis associated.  No tamponade physiology. No signs of pericarditis.  #MSK pain: recommended extra strength tylenol. PCP started steroid.  HLD -continue crestor 10 -Lipid profile today  HTN: Bp well controlled today.   PLAN:    In order of  problems listed above:  TTE in May Fasting lipid profile  Follow up 3 months           Medication Adjustments/Labs and Tests Ordered: Current medicines are reviewed at length with the patient today.  Concerns regarding medicines are outlined above.  Orders Placed This Encounter  Procedures   Lipid panel   EKG 12-Lead   ECHOCARDIOGRAM COMPLETE   No orders of the defined types were placed in this encounter.   Patient Instructions  Medication Instructions:  No Changes In Medications at this time.  *If you need a refill on your cardiac medications before your next appointment, please call your pharmacy*  Lab Work: [place]  If you have labs (blood work) drawn today and your tests are completely normal, you will receive your results only by: [place] (if you have MyChart) OR A paper copy in the mail If you have any lab test that is abnormal or we need to change your treatment, we will call you to review the results.  Testing/Procedures: Your physician has requested that you have an echocardiogram IN MAY 2023. Echocardiography is a painless test that uses sound waves to create images of your heart. It provides your doctor with information about the size and shape of your heart and how well your hearts chambers and valves are working. You may receive an ultrasound enhancing agent through an IV if needed to better visualize your heart during the echo.This procedure takes approximately one hour. There are no restrictions for this procedure. This will take place at the [address].   Follow-Up: At [facility], you and your health needs are our priority.  As part of our continuing mission to provide you with exceptional heart care, we have created designated Provider Care Teams.  These Care Teams include your primary Cardiologist (physician) and Advanced Practice Providers (APPs -  Physician Assistants and Nurse Practitioners) who all work together to  provide you with the care you need, when you need it.  Your next appointment:   3 month(s)   The format for your next appointment:   In Person  Provider:   [name], MD      Signed, [name], MD  03/02/2022 10:52 AM    [place]

## 2022-03-09 ENCOUNTER — Telehealth: Payer: Self-pay

## 2022-03-09 NOTE — Telephone Encounter (Signed)
Patient called in and stated that the prednisone was helping with the pain, patient wants to see if more can be sent in now that she is out she is in pain again. ?

## 2022-03-09 NOTE — Telephone Encounter (Signed)
Okay to send 

## 2022-03-09 NOTE — Telephone Encounter (Signed)
We can do one more round of prednisone but this is not something she should do long term. ? ?[name]. Jerline Pain, MD ?03/09/2022 1:51 PM  ? ?

## 2022-03-10 ENCOUNTER — Other Ambulatory Visit: Payer: Self-pay

## 2022-03-10 MED ORDER — PREDNISONE 20 MG PO TABS: 20.0000 mg | ORAL_TABLET | Freq: Every day | ORAL | 0 refills | Status: DC

## 2022-03-10 NOTE — Telephone Encounter (Signed)
Prednisone sent in for patient and advised this isn't something she needs to do long term, pt verbalized understanding ?

## 2022-03-16 ENCOUNTER — Encounter (HOSPITAL_COMMUNITY): Payer: Self-pay | Admitting: *Deleted

## 2022-04-20 ENCOUNTER — Encounter (HOSPITAL_COMMUNITY): Payer: Self-pay | Admitting: *Deleted

## 2022-04-27 ENCOUNTER — Ambulatory Visit (HOSPITAL_COMMUNITY): Payer: Medicare Other | Attending: Internal Medicine

## 2022-04-27 DIAGNOSIS — I8290 Acute embolism and thrombosis of unspecified vein: Secondary | ICD-10-CM | POA: Diagnosis present

## 2022-04-27 DIAGNOSIS — I3139 Other pericardial effusion (noninflammatory): Secondary | ICD-10-CM | POA: Insufficient documentation

## 2022-04-27 LAB — ECHOCARDIOGRAM COMPLETE
Area-P 1/2: 5.23 cm2
S' Lateral: 2.4 cm

## 2022-05-14 ENCOUNTER — Institutional Professional Consult (permissible substitution) (INDEPENDENT_AMBULATORY_CARE_PROVIDER_SITE_OTHER): Payer: Medicare Other | Admitting: Thoracic Surgery (Cardiothoracic Vascular Surgery)

## 2022-05-14 ENCOUNTER — Encounter (HOSPITAL_COMMUNITY): Payer: Self-pay | Admitting: *Deleted

## 2022-05-14 VITALS — BP 185/91 | HR 91 | Resp 20 | Ht 69.0 in

## 2022-05-14 DIAGNOSIS — I513 Intracardiac thrombosis, not elsewhere classified: Secondary | ICD-10-CM

## 2022-05-14 NOTE — Progress Notes (Signed)
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  She has a history of end-stage renal disease and has had multiple dialysis catheters placed.  Recently she had a PEA arrest x3 with attempted placement of a Hero dialysis catheter at [facility].  She only complains of a chronic cough.     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[place]), nonhemorrhagic, inferior right cerebellum 12/03/2017   left side weakness in leg   Cyst of left ovary 01/12/2018   Diabetic neuropathy, painful ([place]), on low dose Gabapentin 07/13/2013   DM (diabetes mellitus), type 2, uncontrolled, with renal complications 69/62/9528   DM2 (diabetes mellitus, type 2) ([place])    Dysfunction of left eustachian tube, with pusatile tinnitus 11/24/2017   Dyslipidemia associated with type 2 diabetes mellitus ([place]) 06/25/2016   ESRD (end stage renal disease) on dialysis [facility])    On hemodialysis in July 2019 via [facility] then switched to [facility] in Nov 2019.    ESRD with anemia ([facility])    Fibromyalgia    Gastroparesis due to DM    GERD (gastroesophageal reflux disease)    Headache    Hypertension associated with diabetes ([place]) 02/19/2011   IBS (irritable bowel syndrome)    Left-sided weakness 12/10/2017   .   Leiomyoma of uterus    Pancreatitis    PE (pulmonary thromboembolism)  ([place]) 11/04/2019   Pneumonia    Proliferative diabetic retinopathy ([place]) 09/29/2015   Pronation deformity of both feet 06/14/2014   Reactive depression 12/10/2017   .   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[place]) 02/17/2018   Thromboembolism ([place]) 04/05/2018   Upper airway cough syndrome, with recs to stay off ACE and take Pepcid q hs 11/15/2016    Past Surgical History:  Procedure Laterality Date   A/V FISTULAGRAM Right 03/03/2020   Procedure: Venogram;  Surgeon: Waynetta Sandy, MD;  Location: [place] [facility];  Service: Cardiovascular;  Laterality: Right;   ARTERY REPAIR Right 02/17/2018   Procedure: EXPLORATION OF RIGHT BRACHIAL ARTERY;  Surgeon: Conrad Morrison, MD;  Location: [place];  Service: Vascular;  Laterality: Right;   ARTERY REPAIR Right 02/17/2018   Procedure: BRACHIAL ARTERY EXPLORATION AND TRHOMBECTOMY;  Surgeon: Conrad [place], MD;  Location: [place];  Service: Vascular;  Laterality: Right;   AV FISTULA PLACEMENT Left 05/09/2017   Procedure: INSERTION OF ARTERIOVENOUS (AV) GRAFT ARM (ARTEGRAFT);  Surgeon: Conrad [place], MD;  Location: [facility] OR;  Service: Vascular;  Laterality: Left;   AV FISTULA PLACEMENT Right 02/17/2018   Procedure: INSERTION OF ARTERIOVENOUS (AV) GORE-TEX GRAFT ARM RIGHT UPPER ARM;  Surgeon: Conrad [place], MD;  Location: [facility];  Service: Vascular;  Laterality: Right;   AV FISTULA PLACEMENT Left 10/10/2019   Procedure: INSERTION OF LEFT ARTERIOVENOUS (AV) ARTEGRAFT GRAFT ARM;  Surgeon: Waynetta Sandy, MD;  Location: [place];  Service: Vascular;  Laterality: Left;   AV FISTULA PLACEMENT Right 03/19/2020   Procedure: INSERTION OF ARTERIOVENOUS (AV) GORE-TEX GRAFT IN RIGHT ARM AND VIABAHN STENT IN RIGHT AXILLARY ARTERY;  Surgeon: Waynetta Sandy, MD;  Location: [place];  Service: Vascular;  Laterality: Right;   [place] Left  08/31/2016   Procedure: BASILIC VEIN TRANSPOSITION FIRST STAGE;  Surgeon: Conrad [place], MD;  Location: [place];  Service: Vascular;  Laterality: Left;   BRONCHIAL WASHINGS  09/17/2021   Procedure: BRONCHIAL WASHINGS;  Surgeon: Collene Gobble, MD;  Location: [facility];  Service: Cardiopulmonary;;   CENTRAL VENOUS CATHETER INSERTION Left 07/24/2018   Procedure: INSERTION CENTRAL LINE ADULT;  Surgeon: Conrad [place], MD;  Location: [place];  Service: Vascular;  Laterality: Left;   EYE SURGERY     secondary to diabetic retinopathy    FOOT SURGERY Right    t"ook bone out- maybe hammer toe"   INSERTION OF DIALYSIS CATHETER Left 07/24/2018   Procedure: INSERTION OF TUNNELED DIALYSIS CATHETER;  Surgeon: Conrad [place], MD;  Location: [place];  Service: Vascular;  Laterality: Left;   IR FLUORO GUIDE CV LINE LEFT  12/19/2021   IR FLUORO GUIDE CV LINE LEFT  12/29/2021   IR FLUORO GUIDE CV LINE RIGHT  07/21/2018   IR FLUORO GUIDE CV LINE RIGHT  06/01/2019   IR FLUORO GUIDE CV LINE RIGHT  05/05/2020   IR REMOVAL TUN CV CATH W/O FL  01/23/2021   IR REMOVAL TUN CV CATH W/O FL  12/19/2021   IR US GUIDE VASC ACCESS LEFT  12/19/2021   IR US GUIDE VASC ACCESS RIGHT  07/21/2018   IR US GUIDE VASC ACCESS RIGHT  06/01/2019   LIGATION ARTERIOVENOUS GORTEX GRAFT Right 03/20/2020   Procedure: LIGATION ARM ARTERIOVENOUS GORTEX GRAFT With Patch Angioplasty, Thrombectomy;  Surgeon: Waynetta Sandy, MD;  Location: [place];  Service: Vascular;  Laterality: Right;   LIGATION OF ARTERIOVENOUS  FISTULA Left 05/09/2017   Procedure: LIGATION OF ARTERIOVENOUS  FISTULA;  Surgeon: Conrad [place], MD;  Location: [place];  Service: Vascular;  Laterality: Left;   LOOP RECORDER INSERTION N/A 12/05/2017   Procedure: LOOP RECORDER INSERTION;  Surgeon: Evans Lance, MD;  Location: [place] [facility];  Service: Cardiovascular;  Laterality: N/A;   MYOMECTOMY     REMOVAL OF GRAFT Right 02/17/2018   Procedure: REMOVAL OF RIGHT UPPER ARM  ARTERIOVENOUS GRAFT;  Surgeon: Conrad [place], MD;  Location: [place];  Service: Vascular;  Laterality: Right;   REVISION OF ARTERIOVENOUS GORETEX GRAFT Left 07/24/2018   Procedure: REDO ARTERIOVENOUS GORETEX GRAFT;  Surgeon: Conrad Reeves, MD;  Location: [place];  Service: Vascular;  Laterality: Left;   TEE WITHOUT CARDIOVERSION N/A 12/05/2017   Procedure: TRANSESOPHAGEAL ECHOCARDIOGRAM (TEE);  Surgeon: Sanda Klein, MD;  Location: [place];  Service: Cardiovascular;  Laterality: N/A;   UPPER EXTREMITY VENOGRAPHY Left 07/13/2018   Procedure: UPPER EXTREMITY VENOGRAPHY - Central & Left Arm;  Surgeon: Conrad [place], MD;  Location: [place] [facility];  Service: Cardiovascular;  Laterality: Left;   UPPER EXTREMITY VENOGRAPHY Bilateral 09/10/2019   Procedure: UPPER EXTREMITY VENOGRAPHY;  Surgeon: Waynetta Sandy, MD;  Location: [place] [facility];  Service: Cardiovascular;  Laterality: Bilateral;  UTERINE FIBROID SURGERY     VIDEO BRONCHOSCOPY N/A 09/17/2021   Procedure: VIDEO BRONCHOSCOPY WITHOUT FLUORO;  Surgeon: Collene Gobble, MD;  [facility];  Service: Cardiopulmonary;  Laterality: N/A;   VITRECTOMY Bilateral       Social History   Tobacco Use  Smoking Status Never  Smokeless Tobacco Never    Social History   Substance and Sexual Activity  Alcohol Use No   Alcohol/week: 0.0 standard drinks     Allergies  Allergen Reactions   Ibuprofen Other (See Comments)    CKD stage 3. Should avoid.   Dilaudid [Hydromorphone Hcl] Itching and Other (See Comments)    Can take with Benadryl.   Iodinated Contrast Media Rash   Tramadol Itching     Current Outpatient Medications  Medication Sig Dispense Refill   calcium acetate (PHOSLO) 667 MG capsule Take 667 mg by mouth 3 (three) times daily with meals.     gabapentin (NEURONTIN) 300 MG capsule Take 1 capsule (300 mg total) by mouth 3 (three) times a week. Take after dialysis. Do not take more than three times weekly.  30 capsule 3   acetaminophen (TYLENOL) 500 MG tablet Take 500 mg by mouth every 6 (six) hours as needed for mild pain.     Continuous Blood Gluc Sensor (FREESTYLE LIBRE 14 DAY SENSOR) MISC 1 patch by Does not apply route every 14 (fourteen) days. 6 each 5   diclofenac Sodium (VOLTAREN) 1 % GEL Apply 4 g topically 4 (four) times daily. 100 g 0   ELIQUIS 5 MG TABS tablet SMARTSIG:1 Tablet(s) By Mouth Every 12 Hours     insulin aspart (NOVOLOG FLEXPEN) 100 UNIT/ML FlexPen Inject 6 Units into the skin 3 (three) times daily with meals. Sliding scale 15 mL    insulin degludec (TRESIBA FLEXTOUCH) 100 UNIT/ML FlexTouch Pen Inject 26 Units into the skin at bedtime. 24 mL 3   Insulin Pen Needle 29G X 5MM MISC 1 Device by Does not apply route 4 (four) times daily. 400 each 3   midodrine (PROAMATINE) 5 MG tablet Take 1 tablet (5 mg total) by mouth See admin instructions. Given at Dialysis if needed     predniSONE (DELTASONE) 20 MG tablet Take 1 tablet (20 mg total) by mouth daily with breakfast. 7 tablet 0   rosuvastatin (CRESTOR) 10 MG tablet Take 10 mg by mouth at bedtime.     No current facility-administered medications for this visit.    (Not in a hospital admission)   Family History  Problem Relation Age of Onset   Colon polyps Mother    Diabetes Mother    Heart murmur Father        history essentially unknown   Hypertension Father    Diabetes Sister    Kidney failure Sister        kidney transplant   Diabetes Brother    Retinal degeneration Brother    Heart murmur Son    Stroke Paternal Grandmother    Diabetes Sister      Review of Systems:   Review of Systems  Constitutional: Negative.   Respiratory:  Positive for cough.   Cardiovascular:  Negative for chest pain.     Physical Exam: BP (!) 185/91 (BP Location: Right Arm, Patient Position: Sitting)   Pulse 91   Resp 20   Ht '5\' 9"'$  (1.753 m)   LMP  (LMP Unknown)   SpO2 100% Comment: RA  BMI 31.60 kg/m  Physical  Exam Constitutional:  Appearance: [name] ill-appearing.  Eyes:     Extraocular Movements: Extraocular movements intact.  Cardiovascular:     Rate and Rhythm: Normal rate.  Pulmonary:     Effort: Pulmonary effort is normal.  Abdominal:     General: There is no distension.  Neurological:     Mental Status: [name] alert.      Diagnostic Studies & Laboratory data:  Echo: IMPRESSIONS     1. Left ventricular ejection fraction, by estimation, is 60 to 65%. The  left ventricle has normal function. The left ventricle has no regional  wall motion abnormalities. There is severe concentric left ventricular  hypertrophy. Left ventricular diastolic   parameters are consistent with Grade I diastolic dysfunction (impaired  relaxation).   2. Right ventricular systolic function is normal. The right ventricular  size is normal. There is normal pulmonary artery systolic pressure.   3. Right atrial echo density noted. Echo dense in nature and adherent to  the interatrial septum.   4. Moderate pericardial effusion. The pericardial effusion is  circumferential. There is no evidence of cardiac tamponade.   5. The mitral valve is abnormal. No evidence of mitral valve  regurgitation.   6. The aortic valve is tricuspid. There is mild calcification of the  aortic valve. There is mild thickening of the aortic valve. Aortic valve  regurgitation is not visualized. Aortic valve sclerosis is present, with  no evidence of aortic valve stenosis.   I have independently reviewed the above radiologic studies and discussed with the patient   Recent Lab Findings: Lab Results  Component Value Date   WBC 8.3 02/03/2022   HGB 11.6 (L) 02/03/2022   HCT 37.2 02/03/2022   PLT 164 02/03/2022   GLUCOSE 109 (H) 02/03/2022   CHOL 249 (H) 12/03/2017   TRIG 158 (H) 12/03/2017   HDL 48 12/03/2017   LDLDIRECT 120.0 04/17/2015   LDLCALC 169 (H) 12/03/2017   ALT 11 12/28/2021   AST 20 12/28/2021   NA 136  02/03/2022   K 3.7 02/03/2022   CL 96 (L) 02/03/2022   CREATININE 7.88 (H) 02/03/2022   BUN 27 (H) 02/03/2022   CO2 27 02/03/2022   TSH 1.11 03/19/2019   INR 1.0 03/19/2020   HGBA1C 5.6 12/19/2021      Assessment / Plan:   51 year old female with history of a right atrial echodensity concerning for thrombus.  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  [name] not having any significant symptoms from this.  [name] currently being managed with anticoagulation.  I do not think that catheter-based debridement of this would be successful.  Additionally given the fact that she had 3 PEA arrests while attempting to place a dialysis catheter recently, catheter-based debridement would be extremely high risk in this patient.     I  spent 30 minutes counseling the patient face to face.   Lajuana Matte 05/14/2022 3:05 PM

## 2022-05-17 ENCOUNTER — Encounter: Payer: Self-pay | Admitting: *Deleted

## 2022-06-01 ENCOUNTER — Telehealth: Payer: Self-pay | Admitting: Family Medicine

## 2022-06-01 NOTE — Telephone Encounter (Signed)
..   Encouraged patient to contact the pharmacy for refills or they can request refills through [place]:  02/25/2022  NEXT APPOINTMENT DATE: 06/08/2022- Cardiology  MEDICATION: gabapentin (NEURONTIN) 300 MG capsule   Is the patient out of medication? Yes-- was told she had to get approval from PCP before medication could be refilled  PHARMACY: [id number] [id number] Phone:  [phone] Fax:  [phone] DEA #:  [id number] Let patient know to contact pharmacy at the end of the day to make sure medication is ready.  Please notify patient to allow 48-72 hours to process

## 2022-06-02 ENCOUNTER — Other Ambulatory Visit: Payer: Self-pay | Admitting: *Deleted

## 2022-06-02 MED ORDER — GABAPENTIN 300 MG PO CAPS: 300.0000 mg | ORAL_CAPSULE | ORAL | 3 refills | Status: DC

## 2022-06-02 NOTE — Telephone Encounter (Signed)
[facility]

## 2022-06-07 NOTE — Progress Notes (Signed)
Cardiology Office Note:    Date:  06/08/2022   ID:  LY WASS, DOB Jun 30, 1971, MRN [id number]  PCP:  [name], MD   [facility] Providers Cardiologist:  [name], MD     Referring MD: [name], MD   No chief complaint on file. RA thrombus/Pericardial Effusion  History of Present Illness:    Heather [name] is a 51 y.o. female with a hx of ESRD on TTS HD via HD cath, history of CVA, insulin-dependent T2DM, HTN, Charcot foot s/p right BKA, history of PE/DVT , MWF [facility],  anemia of chronic kidney disease  referral for pericardial effusion, and hx of 3 minutes of PEA arrest during IHD catheter placement 01/2022, RA thrombus associated with IHD cather, MSK pain post chest compressions  She's been hospitalized a few times in the last 6 months. She had a dislodged HD catheter on 12/23. K was 6 at that time. CXR confirmed retracted left-sided dialysis catheter with tip extravascular in chest wall. IR was consulted 12/24 for urgent HD catheter placement and dialysis same day.   In January , she had malfunctioning HD catheter and missed to HD sessions. She had a catheter exchange of the left IJ catheter for temporary use on 12/29/2021.  In early February, she went to [facility] for elective HERO graft surgery. She started the procedure and  after 20F catheter was inserted into the IVC, she developed PEA arrest with 3 minutes of chest compressions, epix2. She achieved ROSC. She had intra-op TEE showing pericardial effusion but no tamponade. TEE also noted RA mass c/f thrombus. She was started on eliquis. Lactate went up to 7.   Has not seen cardiology. She does not have heart disease. She had a lexiscan stress test a long time ago. No heart catheterization. She denies CP, shortness of breath. On her CT PE, cac was noted on non gated scan.   [name] still on eliquis 5 mg BID. She started it on Februrary 15th, 2023. The catheter was not replaced.   On crestor 10 mg  daily. No recent LDL.  Family Hx: father has heart disease s/p stent. No heart disease history. Siblings- brother had MI; passed away, and older sister  Social Hx: no smoking hx. She's mainly in her wheelchair. She works from home. She was here with her husband   Interim Hx Echo in May noted to have persistent calcified thrombus in the RA. Also had moderate pericardial effusion without tamponade, this is stable. No evidence of PFO. She saw Dr. Kipp Brood for evaluation for angiovac. The benefit was not felt to outweigh the risks. Today, her blood pressure was quite elevated today 199/98 mmHg.   Cardiology Studies:  At [facility] 01/29/2022- TEE: Large venous sheath visible from SVC traversing the RA and entering the IVC. Large circumferential pericardial effusion without evidence of tamponade. No PFO. Grossly normal mitral, aortic, pulmonic, and tricuspid valve function.   01/29/2022 - limited echo- POSSIBLE MASS ATTACHED TO RA PORT CATH, DOSEN'T UPTAKE DEFINITY CONTRAST    WHEN GIVEN SUGGESTING THROMBUS OR ENDOCARDITIS.     TTE 04/27/2022 EF normal.Normal RV function No significant valve disease Moderate pericardial effusion Calcified RA thrombus remains  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[place]), nonhemorrhagic, inferior right cerebellum 12/03/2017   left side weakness  in leg   Cyst of left ovary 01/12/2018   Diabetic neuropathy, painful ([place]), on low dose Gabapentin 07/13/2013   DM (diabetes mellitus), type 2, uncontrolled, with renal complications 62/22/9798   DM2 (diabetes mellitus, type 2) ([place])    Dysfunction of left eustachian tube, with pusatile tinnitus 11/24/2017   Dyslipidemia associated with type 2 diabetes mellitus ([place]) 06/25/2016   ESRD (end stage renal disease) on dialysis [facility])     On hemodialysis in July 2019 via [facility] then switched to [facility] in Nov 2019.    ESRD with anemia (HCC)    Fibromyalgia    Gastroparesis due to DM    GERD (gastroesophageal reflux disease)    Headache    Hypertension associated with diabetes ([place]) 02/19/2011   IBS (irritable bowel syndrome)    Left-sided weakness 12/10/2017   .   Leiomyoma of uterus    Pancreatitis    PE (pulmonary thromboembolism) ([place]) 11/04/2019   Pneumonia    Proliferative diabetic retinopathy ([place]) 09/29/2015   Pronation deformity of both feet 06/14/2014   Reactive depression 12/10/2017   .   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[place]) 02/17/2018   Thromboembolism ([place]) 04/05/2018   Upper airway cough syndrome, with recs to stay off ACE and take Pepcid q hs 11/15/2016    Past Surgical History:  Procedure Laterality Date   A/V FISTULAGRAM Right 03/03/2020   Procedure: Venogram;  Surgeon: Waynetta Sandy, MD;  Location: [place] CV [facility];  Service: Cardiovascular;  Laterality: Right;   ARTERY REPAIR Right 02/17/2018   Procedure: EXPLORATION OF RIGHT BRACHIAL ARTERY;  Surgeon: Conrad [place], MD;  Location: [place];  Service: Vascular;  Laterality: Right;   ARTERY REPAIR Right 02/17/2018   Procedure: BRACHIAL ARTERY EXPLORATION AND TRHOMBECTOMY;  Surgeon: Conrad [place], MD;  Location: [place];  Service: Vascular;  Laterality: Right;   AV FISTULA PLACEMENT Left 05/09/2017   Procedure: INSERTION OF ARTERIOVENOUS (AV) GRAFT ARM (ARTEGRAFT);  Surgeon: Conrad Hoke, MD;  Location: [facility] OR;  Service: Vascular;  Laterality: Left;   AV FISTULA PLACEMENT Right 02/17/2018   Procedure: INSERTION OF ARTERIOVENOUS (AV) GORE-TEX GRAFT ARM RIGHT UPPER ARM;  Surgeon: Conrad [place], MD;  Location: [place];  Service: Vascular;  Laterality: Right;   AV FISTULA PLACEMENT Left 10/10/2019   Procedure: INSERTION OF LEFT ARTERIOVENOUS (AV) ARTEGRAFT  GRAFT ARM;  Surgeon: Waynetta Sandy, MD;  Location: [place];  Service: Vascular;  Laterality: Left;   AV FISTULA PLACEMENT Right 03/19/2020   Procedure: INSERTION OF ARTERIOVENOUS (AV) GORE-TEX GRAFT IN RIGHT ARM AND VIABAHN STENT IN RIGHT AXILLARY ARTERY;  Surgeon: Waynetta Sandy, MD;  Location: [place];  Service: Vascular;  Laterality: Right;   [place] Left 08/31/2016   Procedure: BASILIC VEIN TRANSPOSITION FIRST STAGE;  Surgeon: Conrad [place], MD;  Location: [place];  Service: Vascular;  Laterality: Left;   BRONCHIAL WASHINGS  09/17/2021   Procedure: BRONCHIAL WASHINGS;  Surgeon: Collene Gobble, MD;  Location: [facility];  Service: Cardiopulmonary;;   CENTRAL VENOUS CATHETER INSERTION Left 07/24/2018   Procedure: INSERTION CENTRAL LINE ADULT;  Surgeon: Conrad , MD;  Location: [facility] OR;  Service: Vascular;  Laterality: Left;   EYE SURGERY     secondary to diabetic retinopathy    FOOT SURGERY Right    t"ook bone out- maybe hammer toe"   INSERTION OF DIALYSIS CATHETER Left 07/24/2018  Procedure: INSERTION OF TUNNELED DIALYSIS CATHETER;  Surgeon: Conrad [place], MD;  Location: [place];  Service: Vascular;  Laterality: Left;   IR FLUORO GUIDE CV LINE LEFT  12/19/2021   IR FLUORO GUIDE CV LINE LEFT  12/29/2021   IR FLUORO GUIDE CV LINE RIGHT  07/21/2018   IR FLUORO GUIDE CV LINE RIGHT  06/01/2019   IR FLUORO GUIDE CV LINE RIGHT  05/05/2020   IR REMOVAL TUN CV CATH W/O FL  01/23/2021   IR REMOVAL TUN CV CATH W/O FL  12/19/2021   IR US GUIDE VASC ACCESS LEFT  12/19/2021   IR US GUIDE VASC ACCESS RIGHT  07/21/2018   IR US GUIDE VASC ACCESS RIGHT  06/01/2019   LIGATION ARTERIOVENOUS GORTEX GRAFT Right 03/20/2020   Procedure: LIGATION ARM ARTERIOVENOUS GORTEX GRAFT With Patch Angioplasty, Thrombectomy;  Surgeon: Waynetta Sandy, MD;  Location: [place];  Service: Vascular;  Laterality: Right;   LIGATION OF ARTERIOVENOUS  FISTULA Left 05/09/2017   Procedure: LIGATION OF  ARTERIOVENOUS  FISTULA;  Surgeon: Conrad [place], MD;  Location: [place];  Service: Vascular;  Laterality: Left;   LOOP RECORDER INSERTION N/A 12/05/2017   Procedure: LOOP RECORDER INSERTION;  Surgeon: Evans Lance, MD;  Location: [name] [facility];  Service: Cardiovascular;  Laterality: N/A;   MYOMECTOMY     REMOVAL OF GRAFT Right 02/17/2018   Procedure: REMOVAL OF RIGHT UPPER ARM ARTERIOVENOUS GRAFT;  Surgeon: Conrad Moonshine, MD;  Location: [place];  Service: Vascular;  Laterality: Right;   REVISION OF ARTERIOVENOUS GORETEX GRAFT Left 07/24/2018   Procedure: REDO ARTERIOVENOUS GORETEX GRAFT;  Surgeon: Conrad [place], MD;  Location: [place];  Service: Vascular;  Laterality: Left;   TEE WITHOUT CARDIOVERSION N/A 12/05/2017   Procedure: TRANSESOPHAGEAL ECHOCARDIOGRAM (TEE);  Surgeon: Sanda Klein, MD;  Location: [place];  Service: Cardiovascular;  Laterality: N/A;   UPPER EXTREMITY VENOGRAPHY Left 07/13/2018   Procedure: UPPER EXTREMITY VENOGRAPHY - Central & Left Arm;  Surgeon: Conrad [place], MD;  Location: [place] [facility];  Service: Cardiovascular;  Laterality: Left;   UPPER EXTREMITY VENOGRAPHY Bilateral 09/10/2019   Procedure: UPPER EXTREMITY VENOGRAPHY;  Surgeon: Waynetta Sandy, MD;  Location: [place] [facility];  Service: Cardiovascular;  Laterality: Bilateral;   UTERINE FIBROID SURGERY     VIDEO BRONCHOSCOPY N/A 09/17/2021   Procedure: VIDEO BRONCHOSCOPY WITHOUT FLUORO;  Surgeon: Collene Gobble, MD;  [facility];  Service: Cardiopulmonary;  Laterality: N/A;   VITRECTOMY Bilateral     Current Medications: Current Meds  Medication Sig   acetaminophen (TYLENOL) 500 MG tablet Take 500 mg by mouth every 6 (six) hours as needed for mild pain.   amLODipine-olmesartan (AZOR) 5-20 MG tablet Take 1 tablet by mouth daily.   calcium acetate (PHOSLO) 667 MG capsule Take 667 mg by mouth 3 (three) times daily with meals.   Continuous Blood Gluc Sensor (FREESTYLE LIBRE 14 DAY  SENSOR) MISC 1 patch by Does not apply route every 14 (fourteen) days.   diclofenac Sodium (VOLTAREN) 1 % GEL Apply 4 g topically 4 (four) times daily.   ELIQUIS 5 MG TABS tablet SMARTSIG:1 Tablet(s) By Mouth Every 12 Hours   gabapentin (NEURONTIN) 300 MG capsule Take 1 capsule (300 mg total) by mouth 3 (three) times a week. Take after dialysis. Do not take more than three times weekly.   insulin aspart (NOVOLOG FLEXPEN) 100 UNIT/ML FlexPen Inject 6 Units into the skin 3 (three) times daily with meals.  Sliding scale   insulin degludec (TRESIBA FLEXTOUCH) 100 UNIT/ML FlexTouch Pen Inject 26 Units into the skin at bedtime.   Insulin Pen Needle 29G X 5MM MISC 1 Device by Does not apply route 4 (four) times daily.   midodrine (PROAMATINE) 5 MG tablet Take 1 tablet (5 mg total) by mouth See admin instructions. Given at Dialysis if needed   predniSONE (DELTASONE) 20 MG tablet Take 1 tablet (20 mg total) by mouth daily with breakfast.   rosuvastatin (CRESTOR) 10 MG tablet Take 10 mg by mouth at bedtime.     Allergies:   Ibuprofen, Dilaudid [hydromorphone hcl], Iodinated contrast media, and Tramadol   Social History   Socioeconomic History   Marital status: Married    Spouse name: Not on file   Number of children: 1   Years of education: college   Highest education level: Not on file  Occupational History   Occupation: customer service rep    Employer: [employer]  Tobacco Use   Smoking status: Never   Smokeless tobacco: Never  Vaping Use   Vaping Use: Never used  Substance and Sexual Activity   Alcohol use: No    Alcohol/week: 0.0 standard drinks of alcohol   Drug use: No   Sexual activity: Not Currently    Partners: Male    Birth control/protection: I.U.D.  Other Topics Concern   Not on file  Social History Narrative   Patient lives with fiance. She has 1 grown son. Works full-time, she Paramedic for attorney.   Caffeine Use: 2 cups daily; sodas occasionally   She has 7  grandchildren   Social Determinants of Radio broadcast assistant Strain: Not on file  Food Insecurity: Not on file  Transportation Needs: Not on file  Physical Activity: Not on file  Stress: Not on file  Social Connections: Not on file     Family History: The patient's family history includes Colon polyps in her mother; Diabetes in her brother, mother, sister, and sister; Heart murmur in her father and son; Hypertension in her father; Kidney failure in her sister; Retinal degeneration in her brother; Stroke in her paternal grandmother.  ROS:   Please see the history of present illness.     All other systems reviewed and are negative.  EKGs/Labs/Other Studies Reviewed:    The following studies were reviewed today:   EKG:  EKG is  ordered today.  The ekg ordered today demonstrates   NSR, low voltage, poor R wave progression  Recent Labs: 12/28/2021: ALT 11 12/30/2021: Magnesium 2.1 02/03/2022: BUN 27; Creatinine, Ser 7.88; Hemoglobin 11.6; Platelets 164; Potassium 3.7; Sodium 136   Recent Lipid Panel    Component Value Date/Time   CHOL 249 (H) 12/03/2017 0409   CHOL 277 02/13/2013 0000   TRIG 158 (H) 12/03/2017 0409   TRIG 200 02/13/2013 0000   HDL 48 12/03/2017 0409   CHOLHDL 5.2 12/03/2017 0409   VLDL 32 12/03/2017 0409   LDLCALC 169 (H) 12/03/2017 0409   LDLCALC 195 02/13/2013 0000   LDLDIRECT 120.0 04/17/2015 1141     Risk Assessment/Calculations:           Physical Exam:    VS:   Vitals:   06/08/22 0919  BP: (!) 199/98  Pulse: 87  SpO2: 99%     Wt Readings from Last 3 Encounters:  03/02/22 214 lb (97.1 kg)  02/25/22 214 lb (97.1 kg)  02/03/22 214 lb (97.1 kg)     GEN:  Well nourished, well developed in no acute distress HEENT: Normal NECK: No JVD;  L central catheter in place LYMPHATICS: No lymphadenopathy CARDIAC: RRR, no murmurs, rubs, gallops RESPIRATORY:  Clear to auscultation without rales, wheezing or rhonchi  ABDOMEN:  non-distended MUSCULOSKELETAL:  No LE edema. R BKA SKIN: Warm and dry NEUROLOGIC:  Alert and oriented x 3 PSYCHIATRIC:  Normal affect   ASSESSMENT:    RA thrombus associated with IHD catheter. Seen on TTE at [facility] in February, 2023. It has persisted and likely calcified. Will continue anticoagulation and repeat echo in a few months  #Pericardial effusion: thought uremic in the setting of ESRD/dialysis associated. No tamponade physiology. No signs of pericarditis.  #MSK pain: recommended extra strength tylenol. PCP started steroid.  HLD; continue crestor 10  HTN: Bp typically well controlled. Today 199/98 mmHg. Gave metop 50 mg tartrate Will start norvasc '5mg'$ - olmesartan 20 mg daily.  Recommended going to the ED with repeat 203/107 mmHg; she opted to recheck at home and if it is still high after taking her new prescription; she will go to the ED   PLAN:    In order of problems listed above:  Limited TTE in August Start norvasc '5mg'$ -olmesartan 20 mg daily Recommended going to the ED if Bps remain elevated Follow up in 6 months       Medication Adjustments/Labs and Tests Ordered: Current medicines are reviewed at length with the patient today.  Concerns regarding medicines are outlined above.  Orders Placed This Encounter  Procedures   ECHOCARDIOGRAM LIMITED   Meds ordered this encounter  Medications   metoprolol tartrate (LOPRESSOR) tablet 50 mg   amLODipine-olmesartan (AZOR) 5-20 MG tablet    Sig: Take 1 tablet by mouth daily.    Dispense:  30 tablet    Refill:  3    Patient Instructions  Medication Instructions:  START: COMBINATION PILL- AMLODIPINE '5mg'$ /OLMESARTAN '20mg'$ - TAKE ONE TABLET ONCE DAILY  *If you need a refill on your cardiac medications before your next appointment, please call your pharmacy*  Lab Work: None Ordered At This Time.  If you have labs (blood work) drawn today and your tests are completely normal, you will receive your results only by: [place] (if you have MyChart) OR A paper copy in the mail If you have any lab test that is abnormal or we need to change your treatment, we will call you to review the results.  Testing/Procedures: Your physician has requested that you have an echocardiogram IN 3 MONTHS. Echocardiography is a painless test that uses sound waves to create images of your heart. It provides your doctor with information about the size and shape of your heart and how well your heart's chambers and valves are working. You may receive an ultrasound enhancing agent through an IV if needed to better visualize your heart during the echo.This procedure takes approximately one hour. There are no restrictions for this procedure. This will take place at the [address], [address].   Follow-Up: At [facility], you and your health needs are our priority.  As part of our continuing mission to provide you with exceptional heart care, we have created designated Provider Care Teams.  These Care Teams include your primary Cardiologist (physician) and Advanced Practice Providers (APPs -  Physician Assistants and Nurse Practitioners) who all work together to provide you with the care you need, when you need it.  Your next appointment:   6 month(s)  The format for your next appointment:  In Person  Provider:   [name], MD           Signed, [name], MD  06/08/2022 10:17 AM    [place]

## 2022-06-08 ENCOUNTER — Ambulatory Visit (INDEPENDENT_AMBULATORY_CARE_PROVIDER_SITE_OTHER): Payer: Medicare Other | Admitting: Internal Medicine

## 2022-06-08 VITALS — BP 199/98 | HR 87 | Ht 69.0 in

## 2022-06-08 DIAGNOSIS — I10 Essential (primary) hypertension: Secondary | ICD-10-CM | POA: Diagnosis not present

## 2022-06-08 DIAGNOSIS — I5189 Other ill-defined heart diseases: Secondary | ICD-10-CM | POA: Diagnosis not present

## 2022-06-08 MED ORDER — METOPROLOL TARTRATE 12.5 MG HALF TABLET
50.0000 mg | ORAL_TABLET | Freq: Once | ORAL | Status: AC
  Administered 2022-06-08: 50 mg via ORAL

## 2022-06-08 MED ORDER — AMLODIPINE-OLMESARTAN 5-20 MG PO TABS: 1.0000 | ORAL_TABLET | Freq: Every day | ORAL | 3 refills | Status: DC

## 2022-06-08 NOTE — Patient Instructions (Signed)
Medication Instructions:  START: COMBINATION PILL- AMLODIPINE '5mg'$ /OLMESARTAN '20mg'$ - TAKE ONE TABLET ONCE DAILY  *If you need a refill on your cardiac medications before your next appointment, please call your pharmacy*  Lab Work: None Ordered At This Time.  If you have labs (blood work) drawn today and your tests are completely normal, you will receive your results only by: [place] (if you have MyChart) OR A paper copy in the mail If you have any lab test that is abnormal or we need to change your treatment, we will call you to review the results.  Testing/Procedures: Your physician has requested that you have an echocardiogram IN 3 MONTHS. Echocardiography is a painless test that uses sound waves to create images of your heart. It provides your doctor with information about the size and shape of your heart and how well your heart's chambers and valves are working. You may receive an ultrasound enhancing agent through an IV if needed to better visualize your heart during the echo.This procedure takes approximately one hour. There are no restrictions for this procedure. This will take place at the [address].   Follow-Up: At [facility], you and your health needs are our priority.  As part of our continuing mission to provide you with exceptional heart care, we have created designated Provider Care Teams.  These Care Teams include your primary Cardiologist (physician) and Advanced Practice Providers (APPs -  Physician Assistants and Nurse Practitioners) who all work together to provide you with the care you need, when you need it.  Your next appointment:   6 month(s)  The format for your next appointment:   In Person  Provider:   [name], MD

## 2022-06-23 NOTE — Progress Notes (Signed)
Heather [name] is a 51 y.o. female here for a follow up on cough  History of Present Illness:   Chief Complaint  Patient presents with   Cough    Pt c/o lingering cough x 2.5 years. Chest congestion    HPI   Chronic Cough Patient presents with c/o of cough that has been onset for 2-3 years. Associated with chest congestion. Still has been experiencing dry cough and some shortness of breath. Symptoms seems to be getting worse. States cough is very forceful that this make her chest hurt sometimes. She has tried several medications in the past with no improvement. Cough seems to be worsening with time. She saw Dr Levy Pupa pulmonology on 09/25/2021 for this issue. She had CT scan of chest on August 2022 which showed micronodular tree in bud opacities in the bilateral upper lobes. She underwent bronchoscopy on 09/17/2021, and was then recommended to do pulmonary function testing and follow up in one months frin 09/25/2021. States she has not followed up with pulmonologist since then. Has had Covid 3 times in the past - most recent in Feb.  Denies fever or chills.No URI symptoms. Denies nausea or vomiting.      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HCC), nonhemorrhagic, inferior right cerebellum 12/03/2017   left side weakness in leg   Cyst of left ovary 01/12/2018   Diabetic neuropathy, painful (HCC), on low dose Gabapentin 07/13/2013   DM (diabetes mellitus), type 2, uncontrolled, with renal complications 10/12/2016   DM2 (diabetes mellitus, type 2) (HCC)    Dysfunction of left eustachian tube, with pusatile tinnitus 11/24/2017   Dyslipidemia associated with type 2 diabetes mellitus (HCC) 06/25/2016   ESRD (end stage renal disease) on dialysis [facility])    On hemodialysis in July 2019  via [facility] then switched to [facility] in Nov 2019.    ESRD with anemia (HCC)    Fibromyalgia    Gastroparesis due to DM    GERD (gastroesophageal reflux disease)    Headache    Hypertension associated with diabetes (HCC) 02/19/2011   IBS (irritable bowel syndrome)    Left-sided weakness 12/10/2017   .   Leiomyoma of uterus    Pancreatitis    PE (pulmonary thromboembolism) (HCC) 11/04/2019   Pneumonia    Proliferative diabetic retinopathy (HCC) 09/29/2015   Pronation deformity of both feet 06/14/2014   Reactive depression 12/10/2017   .   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HCC) 02/17/2018   Thromboembolism (HCC) 04/05/2018   Upper airway cough syndrome, with recs to stay off ACE and take Pepcid q hs 11/15/2016     Social History   Tobacco Use   Smoking status: Never   Smokeless tobacco: Never  Vaping Use   Vaping Use: Never used  Substance Use Topics   Alcohol use: No    Alcohol/week: 0.0 standard drinks of alcohol   Drug use: No    Past Surgical History:  Procedure Laterality Date   A/V FISTULAGRAM Right 03/03/2020   Procedure: Venogram;  Surgeon: Maeola Harman, MD;  Location: [facility] INVASIVE CV LAB;  Service: Cardiovascular;  Laterality: Right;   ARTERY REPAIR Right 02/17/2018   Procedure: EXPLORATION OF RIGHT BRACHIAL ARTERY;  Surgeon: Leonides Sake  L, MD;  Location: [facility] OR;  Service: Vascular;  Laterality: Right;   ARTERY REPAIR Right 02/17/2018   Procedure: BRACHIAL ARTERY EXPLORATION AND TRHOMBECTOMY;  Surgeon: Fransisco Hertz, MD;  Location: [facility] OR;  Service: Vascular;  Laterality: Right;   AV FISTULA PLACEMENT Left 05/09/2017   Procedure: INSERTION OF ARTERIOVENOUS (AV) GRAFT ARM (ARTEGRAFT);  Surgeon: Fransisco Hertz, MD;  Location: [facility] OR;  Service: Vascular;  Laterality: Left;   AV FISTULA PLACEMENT Right 02/17/2018   Procedure: INSERTION OF ARTERIOVENOUS (AV) GORE-TEX GRAFT ARM  RIGHT UPPER ARM;  Surgeon: Fransisco Hertz, MD;  Location: [facility] OR;  Service: Vascular;  Laterality: Right;   AV FISTULA PLACEMENT Left 10/10/2019   Procedure: INSERTION OF LEFT ARTERIOVENOUS (AV) ARTEGRAFT GRAFT ARM;  Surgeon: Maeola Harman, MD;  Location: [facility] OR;  Service: Vascular;  Laterality: Left;   AV FISTULA PLACEMENT Right 03/19/2020   Procedure: INSERTION OF ARTERIOVENOUS (AV) GORE-TEX GRAFT IN RIGHT ARM AND VIABAHN STENT IN RIGHT AXILLARY ARTERY;  Surgeon: Maeola Harman, MD;  Location: [facility] OR;  Service: Vascular;  Laterality: Right;   BASCILIC VEIN TRANSPOSITION Left 08/31/2016   Procedure: BASILIC VEIN TRANSPOSITION FIRST STAGE;  Surgeon: Fransisco Hertz, MD;  Location: [facility] OR;  Service: Vascular;  Laterality: Left;   BRONCHIAL WASHINGS  09/17/2021   Procedure: BRONCHIAL WASHINGS;  Surgeon: Leslye Peer, MD;  Location: [facility] ENDOSCOPY;  Service: Cardiopulmonary;;   CENTRAL VENOUS CATHETER INSERTION Left 07/24/2018   Procedure: INSERTION CENTRAL LINE ADULT;  Surgeon: Fransisco Hertz, MD;  Location[facility] OR;  Service: Vascular;  Laterality: Left;   EYE SURGERY     secondary to diabetic retinopathy    FOOT SURGERY Right    t"ook bone out- maybe hammer toe"   INSERTION OF DIALYSIS CATHETER Left 07/24/2018   Procedure: INSERTION OF TUNNELED DIALYSIS CATHETER;  Surgeon: Fransisco Hertz, MD;  Location: [facility] OR;  Service: Vascular;  Laterality: Left;   IR FLUORO GUIDE CV LINE LEFT  12/19/2021   IR FLUORO GUIDE CV LINE LEFT  12/29/2021   IR FLUORO GUIDE CV LINE RIGHT  07/21/2018   IR FLUORO GUIDE CV LINE RIGHT  06/01/2019   IR FLUORO GUIDE CV LINE RIGHT  05/05/2020   IR REMOVAL TUN CV CATH W/O FL  01/23/2021   IR REMOVAL TUN CV CATH W/O FL  12/19/2021   IR US GUIDE VASC ACCESS LEFT  12/19/2021   IR US GUIDE VASC ACCESS RIGHT  07/21/2018   IR US GUIDE VASC ACCESS RIGHT  06/01/2019   LIGATION ARTERIOVENOUS GORTEX GRAFT Right 03/20/2020   Procedure: LIGATION ARM ARTERIOVENOUS GORTEX GRAFT With Patch  Angioplasty, Thrombectomy;  Surgeon: Maeola Harman, MD;  Location: [facility] OR;  Service: Vascular;  Laterality: Right;   LIGATION OF ARTERIOVENOUS  FISTULA Left 05/09/2017   Procedure: LIGATION OF ARTERIOVENOUS  FISTULA;  Surgeon: Fransisco Hertz, MD;  Location: [facility] OR;  Service: Vascular;  Laterality: Left;   LOOP RECORDER INSERTION N/A 12/05/2017   Procedure: LOOP RECORDER INSERTION;  Surgeon: Marinus Maw, MD;  Location: [facility] INVASIVE CV LAB;  Service: Cardiovascular;  Laterality: N/A;   MYOMECTOMY     REMOVAL OF GRAFT Right 02/17/2018   Procedure: REMOVAL OF RIGHT UPPER ARM ARTERIOVENOUS GRAFT;  Surgeon: Fransisco Hertz, MD;  Location: [facility] OR;  Service: Vascular;  Laterality: Right;   REVISION OF ARTERIOVENOUS GORETEX GRAFT Left 07/24/2018   Procedure: REDO ARTERIOVENOUS GORETEX GRAFT;  Surgeon: Fransisco Hertz, MD;  Location: [facility] OR;  Service: Vascular;  Laterality: Left;   TEE WITHOUT CARDIOVERSION N/A 12/05/2017   Procedure: TRANSESOPHAGEAL ECHOCARDIOGRAM (TEE);  Surgeon: Thurmon Fair, MD;  Location: [facility] ENDOSCOPY;  Service: Cardiovascular;  Laterality: N/A;   UPPER EXTREMITY VENOGRAPHY Left 07/13/2018   Procedure: UPPER EXTREMITY VENOGRAPHY - Central & Left Arm;  Surgeon: Fransisco Hertz, MD;  Location: [facility] INVASIVE CV LAB;  Service: Cardiovascular;  Laterality: Left;   UPPER EXTREMITY VENOGRAPHY Bilateral 09/10/2019   Procedure: UPPER EXTREMITY VENOGRAPHY;  Surgeon: Maeola Harman, MD;  Location: [facility] INVASIVE CV LAB;  Service: Cardiovascular;  Laterality: Bilateral;   UTERINE FIBROID SURGERY     VIDEO BRONCHOSCOPY N/A 09/17/2021   Procedure: VIDEO BRONCHOSCOPY WITHOUT FLUORO;  Surgeon: Leslye Peer, MD;  Location: [facility] ENDOSCOPY;  Service: Cardiopulmonary;  Laterality: N/A;   VITRECTOMY Bilateral     Family History  Problem Relation Age of Onset   Colon polyps Mother    Diabetes Mother    Heart murmur Father        history essentially unknown   Hypertension Father    Diabetes  Sister    Kidney failure Sister        kidney transplant   Diabetes Brother    Retinal degeneration Brother    Heart murmur Son    Stroke Paternal Grandmother    Diabetes Sister     Allergies  Allergen Reactions   Ibuprofen Other (See Comments)    CKD stage 3. Should avoid.   Dilaudid [Hydromorphone Hcl] Itching and Other (See Comments)    Can take with Benadryl.   Iodinated Contrast Media Rash   Tramadol Itching    Current Medications:   Current Outpatient Medications:    acetaminophen (TYLENOL) 500 MG tablet, Take 500 mg by mouth every 6 (six) hours as needed for mild pain., Disp: , Rfl:    amLODipine-olmesartan (AZOR) 5-20 MG tablet, Take 1 tablet by mouth daily., Disp: 30 tablet, Rfl: 3   B Complex-C-Zn-Folic Acid (DIALYVITE 800-ZINC 15) 0.8 MG TABS, Take 1 tablet by mouth daily., Disp: , Rfl:    calcium acetate (PHOSLO) 667 MG capsule, Take 667 mg by mouth 3 (three) times daily with meals., Disp: , Rfl:    Continuous Blood Gluc Sensor (FREESTYLE LIBRE 14 DAY SENSOR) MISC, 1 patch by Does not apply route every 14 (fourteen) days., Disp: 6 each, Rfl: 5   diclofenac Sodium (VOLTAREN) 1 % GEL, Apply 4 g topically 4 (four) times daily., Disp: 100 g, Rfl: 0   ELIQUIS 5 MG TABS tablet, SMARTSIG:1 Tablet(s) By Mouth Every 12 Hours, Disp: , Rfl:    gabapentin (NEURONTIN) 300 MG capsule, Take 1 capsule (300 mg total) by mouth 3 (three) times a week. Take after dialysis. Do not take more than three times weekly., Disp: 30 capsule, Rfl: 3   insulin aspart (NOVOLOG FLEXPEN) 100 UNIT/ML FlexPen, Inject 6 Units into the skin 3 (three) times daily with meals. Sliding scale, Disp: 15 mL, Rfl:    insulin degludec (TRESIBA FLEXTOUCH) 100 UNIT/ML FlexTouch Pen, Inject 26 Units into the skin at bedtime., Disp: 24 mL, Rfl: 3   Insulin Pen Needle 29G X MISC, 1 Device by Does not apply route 4 (four) times daily., Disp: 400 each, Rfl: 3   midodrine (PROAMATINE) 5 MG tablet, Take 1 tablet (5 mg  total) by mouth See admin instructions. Given at Dialysis if needed, Disp: , Rfl:    predniSONE (DELTASONE) 20 MG tablet, Take 1 tablet (20 mg total) by mouth daily  with breakfast., Disp: 7 tablet, Rfl: 0   rosuvastatin (CRESTOR) 10 MG tablet, Take 10 mg by mouth at bedtime., Disp: , Rfl:    Review of Systems:   ROS Negative unless otherwise specified per HPI.   Vitals:   Vitals:   06/24/22 0924  BP: 110/70  Pulse: 91  Temp: 97.6 F (36.4 C)  TempSrc: Temporal  SpO2: 99%  Weight: 221 lb (100.2 kg)  Height: 5\' 9"  (1.753 m)     Body mass index is 32.64 kg/m.  Physical Exam:   Physical Exam Vitals and nursing note reviewed.  Constitutional:      General: [name] not in acute distress.    Appearance: [name] well-developed. [name] not ill-appearing or toxic-appearing.  Cardiovascular:     Rate and Rhythm: Normal rate and regular rhythm.     Pulses: Normal pulses.     Heart sounds: Normal heart sounds, S1 normal and S2 normal.  Pulmonary:     Effort: Pulmonary effort is normal.     Breath sounds: Normal breath sounds.  Skin:    General: Skin is warm and dry.  Neurological:     Mental Status: [name] alert.     GCS: GCS eye subscore is 4. GCS verbal subscore is 5. GCS motor subscore is 6.  Psychiatric:        Speech: Speech normal.        Behavior: Behavior normal. Behavior is cooperative.     Assessment and Plan:   Chronic cough No red flags Discussed that is crucial to follow-up with pulmonology -- appt was made for patient and this was scheduled 7/7 Will trial a round of prednisone at this time Current A1c well controlled, recommend that she continue to keep close tabs on her blood sugar and adjust insulin accordingly while on this  I,Savera Zaman,acting as a scribe for [employer], PA.,have documented all relevant documentation on the behalf of Jarold Motto, PA,as directed by  Jarold Motto, PA while in the presence of Jarold Motto, [place].   I, Jarold Motto, [place], have reviewed all documentation for this visit. The documentation on 06/24/22 for the exam, diagnosis, procedures, and orders are all accurate and complete.   Jarold Motto, PA-C

## 2022-06-24 ENCOUNTER — Encounter: Payer: Self-pay | Admitting: Physician Assistant

## 2022-06-24 ENCOUNTER — Ambulatory Visit (INDEPENDENT_AMBULATORY_CARE_PROVIDER_SITE_OTHER): Payer: Medicare Other | Admitting: Physician Assistant

## 2022-06-24 VITALS — BP 110/70 | HR 91 | Temp 97.6°F | Ht 69.0 in | Wt 221.0 lb

## 2022-06-24 DIAGNOSIS — R053 Chronic cough: Secondary | ICD-10-CM

## 2022-06-24 MED ORDER — PREDNISONE 20 MG PO TABS: 20.0000 mg | ORAL_TABLET | Freq: Two times a day (BID) | ORAL | 0 refills | Status: DC

## 2022-06-24 NOTE — Patient Instructions (Signed)
It was great to see you!  I have sent in the oral steroid for you to take.  Please proceed to your appointment with [facility] on July 7th at 11:30am.  Take care,  Inda Coke PA-C

## 2022-07-02 ENCOUNTER — Ambulatory Visit (INDEPENDENT_AMBULATORY_CARE_PROVIDER_SITE_OTHER): Payer: Medicare Other

## 2022-07-02 ENCOUNTER — Encounter: Payer: Self-pay | Admitting: Nurse Practitioner

## 2022-07-02 ENCOUNTER — Ambulatory Visit (INDEPENDENT_AMBULATORY_CARE_PROVIDER_SITE_OTHER): Payer: Medicare Other | Admitting: Nurse Practitioner

## 2022-07-02 VITALS — BP 132/84 | HR 73 | Ht 69.0 in | Wt 217.8 lb

## 2022-07-02 DIAGNOSIS — J3089 Other allergic rhinitis: Secondary | ICD-10-CM | POA: Diagnosis not present

## 2022-07-02 DIAGNOSIS — R053 Chronic cough: Secondary | ICD-10-CM | POA: Diagnosis not present

## 2022-07-02 DIAGNOSIS — R0602 Shortness of breath: Secondary | ICD-10-CM

## 2022-07-02 DIAGNOSIS — N186 End stage renal disease: Secondary | ICD-10-CM | POA: Diagnosis not present

## 2022-07-02 LAB — POCT EXHALED NITRIC OXIDE: FeNO level (ppb): 15

## 2022-07-02 MED ORDER — ALBUTEROL SULFATE HFA 108 (90 BASE) MCG/ACT IN AERS: 2.0000 | INHALATION_SPRAY | Freq: Four times a day (QID) | RESPIRATORY_TRACT | 2 refills | Status: DC | PRN

## 2022-07-02 MED ORDER — LORATADINE 10 MG PO TABS: 10.0000 mg | ORAL_TABLET | Freq: Every day | ORAL | 11 refills | Status: DC

## 2022-07-02 MED ORDER — FLUTICASONE PROPIONATE 50 MCG/ACT NA SUSP: 2.0000 | Freq: Every day | NASAL | 2 refills | Status: DC

## 2022-07-02 MED ORDER — FAMOTIDINE 20 MG PO TABS: 20.0000 mg | ORAL_TABLET | Freq: Two times a day (BID) | ORAL | 5 refills | Status: DC

## 2022-07-02 NOTE — Progress Notes (Signed)
_0  ID: Heather [name], female    DOB: 1971-10-22, 51 y.o.   MRN: [id number]  Chief Complaint  Patient presents with   Follow-up    Pt here for persistent cough and congestion (starting 1 week ago).     Referring provider: [name], MD  HPI: 51 year old female, never smoker followed for chronic cough.  [name] a patient Dr. Agustina Caroli and last seen in office 09/26/2019.  She underwent bronchoscopy on 09/17/2021 to further evaluate micronodular tree-in-bud opacities in the bilateral upper lobes on previous imaging.  Her cultures, AFB and fungal smears were all negative.  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  TEST/EVENTS:  02/03/2022 CTA chest: Interval increase in size of previously demonstrated pericardial effusion.  No evidence of PE.  No LAD.  There are small to moderate-sized left pleural effusion and small right-sided pleural effusion.  Bilateral lower lobe atelectasis, greater on the left.  There is mild lingular and bilateral upper lobe atelectasis. 04/27/2022 echocardiogram: EF 60 to 65%.  G1 DD.  RV size and function normal.  Normal PASP.  Right atrial echo density is noted.  There is a moderate pericardial effusion.  07/02/2022: Today-follow-up Patient presents today for overdue follow-up with husband.  She continues to have an ongoing, primarily dry cough.  She feels like her cough has been relatively unchanged over the last 2 to 3 years with a slight increase in frequency over the last week.  She has shortness of breath but usually only with coughing spells. She has also had nasal congestion and drainage over the last week or so.  Feels like her eyes have been itchy and she is sneezing occasionally as well.  She denies any wheezing, hemoptysis, anorexia, weight loss, fevers, night sweats.  She does not take anything currently for allergies and has  not tried any inhalers in the past.  She has yet to undergo formal pulmonary function testing.  Allergies  Allergen Reactions   Ibuprofen Other (See Comments)    CKD stage 3. Should avoid.   Dilaudid [Hydromorphone Hcl] Itching and Other (See Comments)    Can take with Benadryl.   Iodinated Contrast Media Rash   Tramadol Itching    Immunization History  Administered Date(s) Administered   Influenza Inj Mdck Quad Pf 09/03/2016   Influenza Inj Mdck Quad With Preservative 09/03/2016   Influenza Split 03/27/2013, 09/03/2016, 09/28/2018   Influenza, Seasonal, Injecte, Preservative Fre 09/28/2018   Influenza,inj,Quad PF,6+ Mos 09/20/2013, 09/11/2014, 09/26/2016, 10/18/2019, 10/18/2019, 09/23/2020, 09/15/2021   Influenza,inj,quad, With Preservative 10/18/2019, 09/23/2020   Influenza-Unspecified 03/27/2013, 09/03/2016   Moderna Sars-Covid-2 Vaccination 08/24/2020, 09/21/2020   Pneumococcal Conjugate-13 12/24/2016   Pneumococcal Polysaccharide-23 04/26/2014, 09/11/2014, 03/15/2019   Tdap 09/20/2013    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[place]), nonhemorrhagic, inferior right cerebellum 12/03/2017   left side weakness in leg   Cyst of left ovary 01/12/2018   Diabetic neuropathy, painful ([place]), on low dose Gabapentin 07/13/2013   DM (diabetes mellitus), type 2, uncontrolled, with renal complications 38/75/6433   DM2 (diabetes mellitus, type 2) ([place])    Dysfunction of left eustachian tube, with pusatile tinnitus 11/24/2017   Dyslipidemia associated with type 2 diabetes mellitus ([place])  06/25/2016   ESRD (end stage renal disease) on dialysis [facility])    On hemodialysis in July 2019 via [facility] then switched to [facility] in Nov 2019.    ESRD with anemia ([facility])    Fibromyalgia    Gastroparesis due to DM     GERD (gastroesophageal reflux disease)    Headache    Hypertension associated with diabetes ([place]) 02/19/2011   IBS (irritable bowel syndrome)    Left-sided weakness 12/10/2017   .   Leiomyoma of uterus    Pancreatitis    PE (pulmonary thromboembolism) ([place]) 11/04/2019   Pneumonia    Proliferative diabetic retinopathy ([place]) 09/29/2015   Pronation deformity of both feet 06/14/2014   Reactive depression 12/10/2017   .   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     amLODipine-olmesartan (AZOR) 5-20 MG tablet Take 1 tablet by mouth daily. 30 tablet 3   B Complex-C-Zn-Folic Acid (DIALYVITE 235-TIRW 15) 0.8 MG TABS Take 1 tablet by mouth daily.     calcium acetate (PHOSLO) 667 MG capsule Take 667 mg by mouth 3 (three) times daily with meals.     Continuous Blood Gluc Sensor (FREESTYLE LIBRE 14 DAY SENSOR) MISC 1 patch by Does not apply route every 14 (fourteen) days. 6 each 5   diclofenac Sodium (VOLTAREN) 1 % GEL Apply 4 g topically 4 (four) times daily. 100 g 0   ELIQUIS 5 MG TABS tablet SMARTSIG:1 Tablet(s) By Mouth Every 12 Hours     gabapentin (NEURONTIN) 300 MG capsule Take 1 capsule (300 mg total) by mouth 3 (three) times a week. Take after dialysis. Do not take more than three times weekly. 30 capsule 3   insulin aspart (NOVOLOG FLEXPEN) 100 UNIT/ML FlexPen Inject 6 Units into the skin 3 (three) times daily with meals. Sliding scale 15 mL     insulin degludec (TRESIBA FLEXTOUCH) 100 UNIT/ML FlexTouch Pen Inject 26 Units into the skin at bedtime. 24 mL 3   Insulin Pen Needle 29G X 5MM MISC 1 Device by Does not apply route 4 (four) times daily. 400 each 3   midodrine (PROAMATINE) 5 MG tablet Take 1 tablet (5 mg total) by mouth See admin instructions. Given at Dialysis if needed     predniSONE (DELTASONE) 20 MG tablet Take 1 tablet (20 mg total) by mouth 2 (two) times daily with a meal. 14 tablet 0   rosuvastatin (CRESTOR) 10 MG tablet Take 10 mg by mouth at bedtime.     No facility-administered medications prior to visit.     Review of Systems:   Constitutional: No weight loss or gain, night sweats, fevers, chills, fatigue, or lassitude. HEENT: No headaches, difficulty swallowing, tooth/dental problems, or sore throat, ear ache. +sneezing, itching, nasal congestion, post nasal drip CV:  No chest pain, orthopnea, PND, swelling in lower extremities, anasarca, dizziness, palpitations, syncope Resp: +chronic cough, slightly increased, dry; SOB with coughing spells.  No excess mucus or change  in color of mucus.  No hemoptysis. No wheezing.  No chest wall deformity GI:  +occasional feeling of fullness. No abdominal pain, nausea, vomiting, diarrhea, change in bowel habits, loss of appetite, bloody stools.  Skin: No rash, lesions, ulcerations MSK:  No joint pain or swelling.  No decreased range of motion.  No back pain. Neuro: No dizziness or lightheadedness.  Psych: No depression or anxiety. Mood stable.     Physical Exam:  BP 132/84   Pulse 73   Ht _0  (1.753 m)   Wt 217 lb 12.8 oz (98.8 kg)   LMP 09/10/2021   SpO2 98%   BMI 32.16 kg/m   GEN: Pleasant, interactive, chronically-ill appearing; in no acute distress. HEENT:  Normocephalic and atraumatic. PERRLA. Sclera white. Nasal turbinates pink, moist and patent bilaterally. No rhinorrhea present. Oropharynx pink and moist, without exudate or edema. No lesions, ulcerations,  or postnasal drip.  NECK:  Supple w/ fair ROM. No JVD present. Normal carotid impulses w/o bruits. Thyroid symmetrical with no goiter or nodules palpated. No lymphadenopathy.   CV: RRR, no m/r/g, no peripheral edema. Pulses intact, +2 bilaterally. No cyanosis, pallor or clubbing. PULMONARY:  Unlabored, regular breathing. Clear bilaterally A&P w/o wheezes/rales/rhonchi. No accessory muscle use. No dullness to percussion. GI: BS present and normoactive. Soft, non-tender to palpation. No organomegaly or masses detected. No CVA tenderness. MSK: No erythema, warmth or tenderness. Cap refil <2 sec all extrem. No deformities or joint swelling noted.  Neuro: A/Ox3. No focal deficits noted.   Skin: Warm, no lesions or rashe Psych: Normal affect and behavior. Judgement and thought content appropriate.     Lab Results:  CBC    Component Value Date/Time   WBC 8.3 02/03/2022 1001   RBC 3.76 (L) 02/03/2022 1001   HGB 11.6 (L) 02/03/2022 1001   HCT 37.2 02/03/2022 1001   PLT 164 02/03/2022 1001   MCV 98.9 02/03/2022 1001   MCH 30.9 02/03/2022 1001   MCHC 31.2 02/03/2022 1001   RDW 15.1 02/03/2022 1001   LYMPHSABS 1.3 02/03/2022 1001   MONOABS 0.6 02/03/2022 1001   EOSABS 0.6 (H) 02/03/2022 1001   BASOSABS 0.0 02/03/2022 1001    BMET    Component Value Date/Time   NA 136 02/03/2022 1001   NA 133 (A) 03/20/2019 0000   K 3.7 02/03/2022 1001   K 4.3 10/16/2013 0000   CL 96 (L) 02/03/2022 1001   CL 100 10/16/2013 0000   CO2 27 02/03/2022 1001   GLUCOSE 109 (H) 02/03/2022 1001   BUN 27 (H) 02/03/2022 1001   BUN 41 (A) 03/20/2019 0000   CREATININE 7.88 (H) 02/03/2022 1001   CREATININE 3.05 (H) 09/03/2015 0855   CALCIUM 8.6 (L) 02/03/2022 1001   CALCIUM 8.9 10/16/2013 0000   GFRNONAA 6 (L) 02/03/2022 1001   GFRNONAA 18 (L) 09/03/2015 0855   GFRAA 4 (L) 05/07/2020 0819   GFRAA 20 (L) 09/03/2015 0855    BNP No results found for: "BNP"   Imaging:  DG Chest 2 View  Result Date:  07/02/2022 CLINICAL DATA:  Chronic cough, evaluate for effusions. EXAM: CHEST - 2 VIEW COMPARISON:  Chest radiograph 12/28/2021 FINDINGS: Normal cardiomediastinal contours. Left chest central venous catheter is unchanged in position. The lungs are clear. No pneumothorax or pleural effusion. No acute finding in the visualized skeleton. IMPRESSION: No acute cardiopulmonary finding.  No pleural effusion. Electronically Signed   By: Audie Pinto M.D.   On: 07/02/2022 12:25    metoprolol tartrate (  LOPRESSOR) tablet 50 mg     Date Action Dose Route User   06/08/2022 [phone] Given 50 mg Oral Bullins, Jenna B, RN           No data to display          No results found for: "NITRICOXIDE"      Assessment & Plan:   Chronic cough Chronic dry cough with flare over the last week, likely related to URI/worsening allergy symptoms. We will check CXR today to rule out superimposed infection and pulmonary edema, as previous CT from 01/2022 showed some evidence of fluid overload. Previous bronchoscopy with negative AFB, BAL, and fungal cultures. Her cough seems to be related to upper airway irritation from postnasal drainage given her constellation of symptoms. We will check an allergen panel and eosinophils today for further evaluation. Recommended she start on intranasal steroid for postnasal drainage control and daily antihistamine. We also discussed GERD as a common cause of cough - start pepcid Twice daily.   Patient Instructions  -Trial Albuterol inhaler 2 puffs every 6 hours as needed for shortness of breath, cough, or wheezing.  -Flonase nasal spray 2 sprays each nostril daily for nasal congestion/allergies -Claritin 1 tab daily for allergies -Pepcid (famotidine) 20 mg twice daily for reflux -Chlorpheniramine 4 mg tab over-the-counter nightly as needed for cough.  Can take every 4-6 hours as needed for cough but may cause drowsiness during the day.  Labs today - cbc with diff, allergen  panel  Pulmonary function testing scheduled today  Chest x ray today.  Follow up with Dr. Lamonte Sakai or Alanson Aly after PFTs. If symptoms do not improve or worsen, please contact office for sooner follow up or seek emergency care.     Shortness of breath SOB primarily with coughing spells. Stable over the past few years. She has never been tried on inhalers from what she can recall. FeNO was nl today; no formal PFTs. We will schedule her for PFTs and trial albuterol. If she has good response to bronchodilator, may consider starting ICS/LABA to see if this will improve her cough/dyspnea.   ESRD (end stage renal disease) ([place]) HD M/W/F. Appears euvolemic on exam. Check CXR to r/o edema given flare in chronic cough. Follow up with nephrology as scheduled.    I spent 35 minutes of dedicated to the care of this patient on the date of this encounter to include pre-visit review of records, face-to-face time with the patient discussing conditions above, post visit ordering of testing, clinical documentation with the electronic health record, making appropriate referrals as documented, and communicating necessary findings to members of the patients care team.  Clayton Bibles, NP 07/02/2022  Pt aware and understands NP's role.

## 2022-07-02 NOTE — Progress Notes (Signed)
Please notify patient CXR was clear. No evidence of infection. Thanks

## 2022-07-02 NOTE — Assessment & Plan Note (Signed)
HD M/W/F. Appears euvolemic on exam. Check CXR to r/o edema given flare in chronic cough. Follow up with nephrology as scheduled.

## 2022-07-02 NOTE — Assessment & Plan Note (Signed)
SOB primarily with coughing spells. Stable over the past few years. She has never been tried on inhalers from what she can recall. FeNO was nl today; no formal PFTs. We will schedule her for PFTs and trial albuterol. If she has good response to bronchodilator, may consider starting ICS/LABA to see if this will improve her cough/dyspnea.

## 2022-07-02 NOTE — Patient Instructions (Addendum)
-  Trial Albuterol inhaler 2 puffs every 6 hours as needed for shortness of breath, cough, or wheezing.  -Flonase nasal spray 2 sprays each nostril daily for nasal congestion/allergies -Claritin 1 tab daily for allergies -Pepcid (famotidine) 20 mg twice daily for reflux -Chlorpheniramine 4 mg tab over-the-counter nightly as needed for cough.  Can take every 4-6 hours as needed for cough but may cause drowsiness during the day.  Labs today - cbc with diff, allergen panel  Pulmonary function testing scheduled today  Chest x ray today.  Follow up with Dr. Lamonte Sakai or Alanson Aly after PFTs. If symptoms do not improve or worsen, please contact office for sooner follow up or seek emergency care.

## 2022-07-02 NOTE — Assessment & Plan Note (Signed)
Chronic dry cough with flare over the last week, likely related to URI/worsening allergy symptoms. We will check CXR today to rule out superimposed infection and pulmonary edema, as previous CT from 01/2022 showed some evidence of fluid overload. Previous bronchoscopy with negative AFB, BAL, and fungal cultures. Her cough seems to be related to upper airway irritation from postnasal drainage given her constellation of symptoms. We will check an allergen panel and eosinophils today for further evaluation. Recommended she start on intranasal steroid for postnasal drainage control and daily antihistamine. We also discussed GERD as a common cause of cough - start pepcid Twice daily.   Patient Instructions  -Trial Albuterol inhaler 2 puffs every 6 hours as needed for shortness of breath, cough, or wheezing.  -Flonase nasal spray 2 sprays each nostril daily for nasal congestion/allergies -Claritin 1 tab daily for allergies -Pepcid (famotidine) 20 mg twice daily for reflux -Chlorpheniramine 4 mg tab over-the-counter nightly as needed for cough.  Can take every 4-6 hours as needed for cough but may cause drowsiness during the day.  Labs today - cbc with diff, allergen panel  Pulmonary function testing scheduled today  Chest x ray today.  Follow up with Dr. Lamonte Sakai or Alanson Aly after PFTs. If symptoms do not improve or worsen, please contact office for sooner follow up or seek emergency care.

## 2022-07-06 ENCOUNTER — Other Ambulatory Visit: Payer: Self-pay | Admitting: Family Medicine

## 2022-08-18 ENCOUNTER — Emergency Department (HOSPITAL_BASED_OUTPATIENT_CLINIC_OR_DEPARTMENT_OTHER)
Admission: EM | Admit: 2022-08-18 | Discharge: 2022-08-19 | Disposition: A | Discharge status: Home / Self Care | Payer: Medicare Other | Attending: Emergency Medicine | Admitting: Emergency Medicine

## 2022-08-18 ENCOUNTER — Emergency Department (HOSPITAL_BASED_OUTPATIENT_CLINIC_OR_DEPARTMENT_OTHER): Payer: Medicare Other

## 2022-08-18 ENCOUNTER — Encounter (HOSPITAL_BASED_OUTPATIENT_CLINIC_OR_DEPARTMENT_OTHER): Payer: Self-pay

## 2022-08-18 ENCOUNTER — Other Ambulatory Visit: Payer: Self-pay

## 2022-08-18 DIAGNOSIS — I120 Hypertensive chronic kidney disease with stage 5 chronic kidney disease or end stage renal disease: Secondary | ICD-10-CM | POA: Diagnosis not present

## 2022-08-18 DIAGNOSIS — Z992 Dependence on renal dialysis: Secondary | ICD-10-CM | POA: Insufficient documentation

## 2022-08-18 DIAGNOSIS — Z794 Long term (current) use of insulin: Secondary | ICD-10-CM | POA: Diagnosis not present

## 2022-08-18 DIAGNOSIS — E1122 Type 2 diabetes mellitus with diabetic chronic kidney disease: Secondary | ICD-10-CM | POA: Diagnosis not present

## 2022-08-18 DIAGNOSIS — Z20822 Contact with and (suspected) exposure to covid-19: Secondary | ICD-10-CM | POA: Diagnosis not present

## 2022-08-18 DIAGNOSIS — Z79899 Other long term (current) drug therapy: Secondary | ICD-10-CM | POA: Insufficient documentation

## 2022-08-18 DIAGNOSIS — G44209 Tension-type headache, unspecified, not intractable: Secondary | ICD-10-CM | POA: Diagnosis not present

## 2022-08-18 DIAGNOSIS — R519 Headache, unspecified: Secondary | ICD-10-CM | POA: Diagnosis present

## 2022-08-18 DIAGNOSIS — R053 Chronic cough: Secondary | ICD-10-CM | POA: Diagnosis not present

## 2022-08-18 DIAGNOSIS — N186 End stage renal disease: Secondary | ICD-10-CM | POA: Diagnosis not present

## 2022-08-18 DIAGNOSIS — Z7901 Long term (current) use of anticoagulants: Secondary | ICD-10-CM | POA: Diagnosis not present

## 2022-08-18 LAB — SARS CORONAVIRUS 2 BY RT PCR: SARS Coronavirus 2 by RT PCR: NEGATIVE

## 2022-08-18 MED ORDER — PROCHLORPERAZINE EDISYLATE 10 MG/2ML IJ SOLN
10.0000 mg | Freq: Once | INTRAMUSCULAR | Status: AC
  Administered 2022-08-19: 10 mg via INTRAVENOUS
  Filled 2022-08-18: qty 2

## 2022-08-18 MED ORDER — DIPHENHYDRAMINE HCL 50 MG/ML IJ SOLN
25.0000 mg | Freq: Once | INTRAMUSCULAR | Status: AC
  Administered 2022-08-19: 25 mg via INTRAVENOUS
  Filled 2022-08-18: qty 1

## 2022-08-18 MED ORDER — LACTATED RINGERS IV BOLUS
500.0000 mL | Freq: Once | INTRAVENOUS | Status: AC
  Administered 2022-08-19: 500 mL via INTRAVENOUS

## 2022-08-18 NOTE — ED Notes (Signed)
Attempted IV x 1 without success.  

## 2022-08-18 NOTE — ED Triage Notes (Signed)
Pt reports headache x1 week. Also reports cough x1 year. Pt states she has pain to LT breast since Sunday; no drainage, no lumps. Pt reports being around her sister who was sick and had COVID.

## 2022-08-19 DIAGNOSIS — G44209 Tension-type headache, unspecified, not intractable: Secondary | ICD-10-CM | POA: Diagnosis not present

## 2022-08-19 NOTE — ED Provider Notes (Incomplete)
[facility] EMERGENCY DEPARTMENT Provider Note   CSN: [id number] Arrival date & time: 08/18/22  1944     History  Chief Complaint  Patient presents with  . Headache  . Cough    Heather [name] is a 51 y.o. female with history of HTN, type 2 diabetes complicated by CKD, ESRD and right below the knee amputation, previous CVA who presents to the emergency department for evaluation of a numerous complaints.  She endorses a headache across her forehead that has been ongoing for 1 week and not responding to Tylenol.  She also states that her left breast has some tenderness.  She also endorses a cough that has been ongoing for 1 year.  [name] concerned that she has been around her sister who tested positive for COVID and is worried that she also has COVID.  Patient states that she has been following with her family care doctor regarding her cough and is currently taking steroids and also has an inhaler at home.  On chart review, I also noted that patient follows with [facility] pulmonology and was most recently seen in July 2023.  Patient has had very extensive work-up regarding her chronic cough including bronchoscopy and September 2022 she states there have been no new changes, however she is frustrated that the cough has not gotten better despite treatment.    Headache Associated symptoms: cough   Cough Associated symptoms: headaches        Home Medications Prior to Admission medications   Medication Sig Start Date End Date Taking? Authorizing Provider  acetaminophen (TYLENOL) 500 MG tablet Take 500 mg by mouth every 6 (six) hours as needed for mild pain.    [provider]  albuterol (VENTOLIN HFA) 108 (90 Base) MCG/ACT inhaler Inhale 2 puffs into the lungs every 6 (six) hours as needed for wheezing or shortness of breath. 07/02/22   Cobb, Karie Schwalbe, NP  amLODipine-olmesartan (AZOR) 5-20 MG tablet Take 1 tablet by mouth daily. 06/08/22   [name], MD  B  Complex-C-Zn-Folic Acid (DIALYVITE 062-BJSE 15) 0.8 MG TABS Take 1 tablet by mouth daily. 05/10/22   [provider]  calcium acetate (PHOSLO) 667 MG capsule Take 667 mg by mouth 3 (three) times daily with meals. 08/25/21   [provider]  Continuous Blood Gluc Sensor (FREESTYLE LIBRE 14 DAY SENSOR) MISC 1 patch by Does not apply route every 14 (fourteen) days. 02/25/22   [name], MD  diclofenac Sodium (VOLTAREN) 1 % GEL Apply 4 g topically 4 (four) times daily. 02/03/22   Deno Etienne, DO  ELIQUIS 5 MG TABS tablet SMARTSIG:1 Tablet(s) By Mouth Every 12 Hours 01/31/22   [provider]  famotidine (PEPCID) 20 MG tablet Take 1 tablet (20 mg total) by mouth 2 (two) times daily. 07/02/22   Cobb, Karie Schwalbe, NP  fluticasone (FLONASE) 50 MCG/ACT nasal spray Place 2 sprays into both nostrils daily. 07/02/22   Cobb, Karie Schwalbe, NP  gabapentin (NEURONTIN) 300 MG capsule TAKE 1 CAPSULE (300 MG TOTAL) BY MOUTH DAILY AS NEEDED. TAKE AFTER DIALYSIS. DO NOT TAKE MORE THAN THREE TIMES WEEKLY. 07/06/22   [name], MD  insulin aspart (NOVOLOG FLEXPEN) 100 UNIT/ML FlexPen Inject 6 Units into the skin 3 (three) times daily with meals. Sliding scale 02/25/22   [name], MD  insulin degludec (TRESIBA FLEXTOUCH) 100 UNIT/ML FlexTouch Pen Inject 26 Units into the skin at bedtime. 02/25/22   [name], MD  Insulin  Pen Needle 29G X 5MM MISC 1 Device by Does not apply route 4 (four) times daily. 12/26/19   Shamleffer, Melanie Crazier, MD  loratadine (CLARITIN) 10 MG tablet Take 1 tablet (10 mg total) by mouth daily. 07/02/22   Cobb, Karie Schwalbe, NP  midodrine (PROAMATINE) 5 MG tablet Take 1 tablet (5 mg total) by mouth See admin instructions. Given at Dialysis if needed 09/17/21   Collene Gobble, MD  predniSONE (DELTASONE) 20 MG tablet Take 1 tablet (20 mg total) by mouth 2 (two) times daily with a meal. 06/24/22   Inda Coke, PA  rosuvastatin (CRESTOR) 10 MG tablet Take 10 mg by mouth  at bedtime. 01/31/22   [provider]      Allergies    Ibuprofen, Dilaudid [hydromorphone hcl], Iodinated contrast media, and Tramadol    Review of Systems   Review of Systems  Respiratory:  Positive for cough.   Neurological:  Positive for headaches.    Physical Exam Updated Vital Signs BP 112/63   Pulse 88   Temp 98.2 F (36.8 C) (Oral)   Resp 18   Ht '5\' 9"'$  (1.753 m)   Wt 98.4 kg   LMP 09/10/2021   SpO2 95%   BMI 32.05 kg/m  Physical Exam  ED Results / Procedures / Treatments   Labs (all labs ordered are listed, but only abnormal results are displayed) Labs Reviewed  SARS CORONAVIRUS 2 BY RT PCR    EKG None  Radiology DG Chest 2 View  Result Date: 08/18/2022 CLINICAL DATA:  Chronic cough. EXAM: CHEST - 2 VIEW COMPARISON:  Radiograph 07/02/2022, CT 02/03/2022 FINDINGS: Left-sided dialysis catheter tip unchanged at the atrial caval junction. Left chest wall loop recorder in place. Mild cardiomegaly. Stable mediastinal contours. Vascular congestion without pulmonary edema. No pleural effusion. No pneumothorax. Right axillary stent. IMPRESSION: Mild cardiomegaly and vascular congestion. Electronically Signed   By: Keith Rake M.D.   On: 08/18/2022 23:51    Procedures Procedures  {Document cardiac monitor, telemetry assessment procedure when appropriate:1}  Medications Ordered in ED Medications  lactated ringers bolus 500 mL (has no administration in time range)  prochlorperazine (COMPAZINE) injection 10 mg (has no administration in time range)  diphenhydrAMINE (BENADRYL) injection 25 mg (has no administration in time range)    ED Course/ Medical Decision Making/ A&P                           Medical Decision Making Amount and/or Complexity of Data Reviewed Radiology: ordered.  Risk Prescription drug management.   ***  {Document critical care time when appropriate:1} {Document review of labs and clinical decision tools ie heart score,  Chads2Vasc2 etc:1}  {Document your independent review of radiology images, and any outside records:1} {Document your discussion with family members, caretakers, and with consultants:1} {Document social determinants of health affecting pt's care:1} {Document your decision making why or why not admission, treatments were needed:1} Final Clinical Impression(s) / ED Diagnoses Final diagnoses:  None    Rx / DC Orders ED Discharge Orders     None

## 2022-08-19 NOTE — ED Provider Notes (Signed)
[place] EMERGENCY DEPARTMENT Provider Note   CSN: [id number] Arrival date & time: 08/18/22  1944     History  Chief Complaint  Patient presents with   Headache   Cough    Heather [name] is a 51 y.o. 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  She endorses a headache across her forehead that has been ongoing for 1 week and not responding to Tylenol.  She also states that her left breast has some tenderness.  She also endorses a cough that has been ongoing for 1 year.  [name] concerned that she has been around her sister who tested positive for COVID and is worried that she also has COVID.  Patient states that she has been following with her family care doctor regarding her cough and is currently taking steroids and also has an inhaler at home.  On chart review, I also noted that patient follows with [place] and was most recently seen in July 2023 for follow-up.  Patient has had very extensive work-up regarding her chronic cough including bronchoscopy in September 2022.  She states there have been no new changes, however she is frustrated that the cough has not gotten better despite treatment.  She denies fever, chills, chest pain, shortness of breath, abdominal pain, nausea, vomiting and diarrhea.   Headache Associated symptoms: cough   Cough Associated symptoms: headaches        Home Medications Prior to Admission medications   Medication Sig Start Date End Date Taking? Authorizing Provider  acetaminophen (TYLENOL) 500 MG tablet Take 500 mg by mouth every 6 (six) hours as needed for mild pain.    [provider]  albuterol (VENTOLIN HFA) 108 (90 Base) MCG/ACT inhaler Inhale 2 puffs into the lungs every 6 (six) hours as needed for wheezing or  shortness of breath. 07/02/22   Cobb, Karie Schwalbe, NP  amLODipine-olmesartan (AZOR) 5-20 MG tablet Take 1 tablet by mouth daily. 06/08/22   [name], MD  B Complex-C-Zn-Folic Acid (DIALYVITE 417-EYCX 15) 0.8 MG TABS Take 1 tablet by mouth daily. 05/10/22   [provider]  calcium acetate (PHOSLO) 667 MG capsule Take 667 mg by mouth 3 (three) times daily with meals. 08/25/21   [provider]  Continuous Blood Gluc Sensor (FREESTYLE LIBRE 14 DAY SENSOR) MISC 1 patch by Does not apply route every 14 (fourteen) days. 02/25/22   [name], MD  diclofenac Sodium (VOLTAREN) 1 % GEL Apply 4 g topically 4 (four) times daily. 02/03/22   Deno Etienne, DO  ELIQUIS 5 MG TABS tablet SMARTSIG:1 Tablet(s) By Mouth Every 12 Hours 01/31/22   [provider]  famotidine (PEPCID) 20 MG tablet Take 1 tablet (20 mg total) by mouth 2 (two) times daily. 07/02/22   Cobb, Karie Schwalbe, NP  fluticasone (FLONASE) 50 MCG/ACT nasal spray Place 2 sprays into both nostrils daily. 07/02/22   Cobb, Karie Schwalbe, NP  gabapentin (NEURONTIN) 300 MG capsule TAKE 1 CAPSULE (300 MG TOTAL) BY MOUTH DAILY AS NEEDED. TAKE AFTER DIALYSIS. DO NOT TAKE MORE THAN THREE TIMES WEEKLY. 07/06/22   [name], MD  insulin aspart (NOVOLOG FLEXPEN) 100 UNIT/ML FlexPen Inject 6 Units into the skin 3 (three) times daily with meals. Sliding scale 02/25/22   [name], MD  insulin  degludec (TRESIBA FLEXTOUCH) 100 UNIT/ML FlexTouch Pen Inject 26 Units into the skin at bedtime. 02/25/22   [name], MD  Insulin Pen Needle 29G X 5MM MISC 1 Device by Does not apply route 4 (four) times daily. 12/26/19   Shamleffer, Melanie Crazier, MD  loratadine (CLARITIN) 10 MG tablet Take 1 tablet (10 mg total) by mouth daily. 07/02/22   Cobb, Karie Schwalbe, NP  midodrine (PROAMATINE) 5 MG tablet Take 1 tablet (5 mg total) by mouth See admin instructions. Given at Dialysis if needed 09/17/21   Collene Gobble, MD  predniSONE (DELTASONE) 20 MG  tablet Take 1 tablet (20 mg total) by mouth 2 (two) times daily with a meal. 06/24/22   Inda Coke, PA  rosuvastatin (CRESTOR) 10 MG tablet Take 10 mg by mouth at bedtime. 01/31/22   [provider]      Allergies    Ibuprofen, Dilaudid [hydromorphone hcl], Iodinated contrast media, and Tramadol    Review of Systems   Review of Systems  Respiratory:  Positive for cough.   Neurological:  Positive for headaches.    Physical Exam Updated Vital Signs BP 112/63   Pulse 88   Temp 98.2 F (36.8 C) (Oral)   Resp 18   Ht '5\' 9"'$  (1.753 m)   Wt 98.4 kg   LMP 09/10/2021   SpO2 95%   BMI 32.05 kg/m  Physical Exam Vitals and nursing note reviewed.  Constitutional:      General: [name] not in acute distress.    Appearance: [name] not ill-appearing.  HENT:     Head: Atraumatic.  Eyes:     Extraocular Movements: Extraocular movements intact.     Conjunctiva/sclera: Conjunctivae normal.     Pupils: Pupils are equal, round, and reactive to light.  Cardiovascular:     Rate and Rhythm: Normal rate and regular rhythm.     Pulses: Normal pulses.     Heart sounds: No murmur heard. Pulmonary:     Effort: Pulmonary effort is normal. No respiratory distress.     Breath sounds: Normal breath sounds.  Chest:       Comments: Mild tenderness noted to the left breast without underlying skin changes.  No erythema, fluctuance, induration, bruising, ecchymosis or swelling. Abdominal:     General: Abdomen is flat. There is no distension.     Palpations: Abdomen is soft.     Tenderness: There is no abdominal tenderness.  Musculoskeletal:        General: Normal range of motion.     Cervical back: Normal range of motion.  Skin:    General: Skin is warm and dry.     Capillary Refill: Capillary refill takes less than 2 seconds.  Neurological:     General: No focal deficit present.     Mental Status: [name] alert.  Psychiatric:        Mood and Affect: Mood normal.     ED Results /  Procedures / Treatments   Labs (all labs ordered are listed, but only abnormal results are displayed) Labs Reviewed  SARS CORONAVIRUS 2 BY RT PCR    EKG None  Radiology DG Chest 2 View  Result Date: 08/18/2022 CLINICAL DATA:  Chronic cough. EXAM: CHEST - 2 VIEW COMPARISON:  Radiograph 07/02/2022, CT 02/03/2022 FINDINGS: Left-sided dialysis catheter tip unchanged at the atrial caval junction. Left chest wall loop recorder in place. Mild cardiomegaly. Stable mediastinal contours. Vascular congestion without pulmonary edema. No pleural effusion. No pneumothorax.  Right axillary stent. IMPRESSION: Mild cardiomegaly and vascular congestion. Electronically Signed   By: Keith Rake M.D.   On: 08/18/2022 23:51    Procedures Procedures    Medications Ordered in ED Medications  prochlorperazine (COMPAZINE) injection 10 mg (has no administration in time range)  diphenhydrAMINE (BENADRYL) injection 25 mg (has no administration in time range)  lactated ringers bolus 500 mL (500 mLs Intravenous New Bag/Given 08/19/22 0015)    ED Course/ Medical Decision Making/ A&P                           Medical Decision Making Amount and/or Complexity of Data Reviewed Radiology: ordered.  Risk Prescription drug management.   Social determinants of health:  Social History   Socioeconomic History   Marital status: Married    Spouse name: Not on file   Number of children: 1   Years of education: college   Highest education level: Not on file  Occupational History   Occupation: customer service rep    Employer: [employer]  Tobacco Use   Smoking status: Never   Smokeless tobacco: Never  Vaping Use   Vaping Use: Never used  Substance and Sexual Activity   Alcohol use: No    Alcohol/week: 0.0 standard drinks of alcohol   Drug use: No   Sexual activity: Not Currently    Partners: Male    Birth control/protection: I.U.D.  Other Topics Concern   Not on file  Social History  Narrative   Patient lives with fiance. She has 1 grown son. Works full-time, she Paramedic for attorney.   Caffeine Use: 2 cups daily; sodas occasionally   She has 7 grandchildren   Social Determinants of Radio broadcast assistant Strain: Not on file  Food Insecurity: Not on file  Transportation Needs: Not on file  Physical Activity: Not on file  Stress: Not on file  Social Connections: Not on file  Intimate Partner Violence: Not on file     Initial impression:  This patient presents to the ED for concern of multiple complaints, this involves an extensive number of treatment options, and is a complaint that carries with it a high risk of complications and morbidity.    Comorbidities affecting care:  Numerous, per HPI  Additional history obtained: Husband, pulmonology records  Lab Tests  I Ordered, reviewed, and interpreted labs and EKG.  The pertinent results include:  Covid negative  Imaging Studies ordered:  I ordered imaging studies including  Chest xray with mild cardiomegaly and pulmonary vasculature  I independently visualized and interpreted imaging and I agree with the radiologist interpretation.    Medicines ordered and prescription drug management:  I ordered medication including: Compazine '10mg'$  Benadryl '25mg'$    Initially ordered fluids and then dc'd immediately after starting d/t patient's ESRD status I have reviewed the patients home medicines and have made adjustments as needed    ED Course/Re-evaluation: Presents in no acute distress and is nontoxic-appearing.  Vitals are without significant abnormality.  On exam, heart and lung sounds are normal.  Abdomen is soft, nondistended nontender to palpation. She reports some tenderness when pressing on her left breast but on exam there is no underlying skin changes or evidence of infection or deformity. Advised patient that while we can get a chest x-ray to rule out acute infectious causes of her cough it  is unlikely that we can provide any additional relief or insight to her symptoms over  the pulmonologist.  I ordered a chest x-ray which showed mild cardiomegaly with vascular congestion.  After discussion with my attending, Dr. Tyrone Nine, he notes that this can often be seen in dialysis patients, since patient is not complaining of acute shortness of breath, fluid overload or chest pain, she can follow this up with her PCP for repeat imaging as needed. COVID test was negative.  I ordered compazine and benadryl for patient's headache that was pending administration at shift change. Care handed off to Dr Tyrone Nine with plans to reassess patient's headache after treatment. Anticipate discharge   Final Clinical Impression(s) / ED Diagnoses Final diagnoses:  Acute non intractable tension-type headache  Chronic cough    Rx / DC Orders ED Discharge Orders     None         Tonye Pearson, PA-C 08/19/22 0031    Deno Etienne, DO 08/19/22 0132

## 2022-08-19 NOTE — Discharge Instructions (Signed)
We have treated your headache here in the emergency department and if it returns, you can take Tylenol at home for your pain.   Continue to follow with your pulmonologist for your cough. I know that this has been bothering you for some time now, so please continue your treatments as prescribed by your lung doctor. Your xray today showed no evidence of pneumonia or infection. Your covid test was negative.

## 2022-08-31 ENCOUNTER — Other Ambulatory Visit (HOSPITAL_COMMUNITY): Payer: Medicare Other

## 2022-09-09 ENCOUNTER — Other Ambulatory Visit: Payer: Self-pay | Admitting: Internal Medicine

## 2022-09-14 ENCOUNTER — Ambulatory Visit (HOSPITAL_COMMUNITY): Payer: Medicare Other | Attending: Internal Medicine

## 2022-09-14 DIAGNOSIS — I5189 Other ill-defined heart diseases: Secondary | ICD-10-CM | POA: Diagnosis present

## 2022-09-14 LAB — ECHOCARDIOGRAM LIMITED
Area-P 1/2: 6.83 cm2
S' Lateral: 2.4 cm

## 2022-09-16 ENCOUNTER — Other Ambulatory Visit: Payer: Self-pay | Admitting: Internal Medicine

## 2022-09-16 NOTE — Progress Notes (Signed)
Called and consented for TEE  Shared Decision Making/Informed Consent The risks [esophageal damage, perforation (1:10,000 risk), bleeding, pharyngeal hematoma as well as other potential complications associated with conscious sedation including aspiration, arrhythmia, respiratory failure and death], benefits (treatment guidance and diagnostic support) and alternatives of a transesophageal echocardiogram were discussed in detail with Heather [name] and she is willing to proceed.

## 2022-09-20 ENCOUNTER — Encounter: Payer: Self-pay | Admitting: *Deleted

## 2022-09-21 ENCOUNTER — Emergency Department (HOSPITAL_BASED_OUTPATIENT_CLINIC_OR_DEPARTMENT_OTHER)
Admission: EM | Admit: 2022-09-21 | Discharge: 2022-09-21 | Disposition: A | Discharge status: Home / Self Care | Payer: Medicare Other | Attending: Emergency Medicine | Admitting: Emergency Medicine

## 2022-09-21 ENCOUNTER — Other Ambulatory Visit: Payer: Self-pay

## 2022-09-21 ENCOUNTER — Encounter (HOSPITAL_BASED_OUTPATIENT_CLINIC_OR_DEPARTMENT_OTHER): Payer: Self-pay | Admitting: Pediatrics

## 2022-09-21 ENCOUNTER — Ambulatory Visit (INDEPENDENT_AMBULATORY_CARE_PROVIDER_SITE_OTHER): Payer: Medicare Other | Admitting: Internal Medicine

## 2022-09-21 ENCOUNTER — Emergency Department (HOSPITAL_BASED_OUTPATIENT_CLINIC_OR_DEPARTMENT_OTHER): Payer: Medicare Other

## 2022-09-21 VITALS — Ht 69.0 in | Wt 236.0 lb

## 2022-09-21 DIAGNOSIS — G44209 Tension-type headache, unspecified, not intractable: Secondary | ICD-10-CM | POA: Insufficient documentation

## 2022-09-21 DIAGNOSIS — R051 Acute cough: Secondary | ICD-10-CM | POA: Insufficient documentation

## 2022-09-21 DIAGNOSIS — I120 Hypertensive chronic kidney disease with stage 5 chronic kidney disease or end stage renal disease: Secondary | ICD-10-CM | POA: Diagnosis not present

## 2022-09-21 DIAGNOSIS — Z20822 Contact with and (suspected) exposure to covid-19: Secondary | ICD-10-CM | POA: Diagnosis not present

## 2022-09-21 DIAGNOSIS — Z992 Dependence on renal dialysis: Secondary | ICD-10-CM | POA: Insufficient documentation

## 2022-09-21 DIAGNOSIS — E1140 Type 2 diabetes mellitus with diabetic neuropathy, unspecified: Secondary | ICD-10-CM | POA: Diagnosis not present

## 2022-09-21 DIAGNOSIS — I5189 Other ill-defined heart diseases: Secondary | ICD-10-CM

## 2022-09-21 DIAGNOSIS — Z01812 Encounter for preprocedural laboratory examination: Secondary | ICD-10-CM | POA: Insufficient documentation

## 2022-09-21 DIAGNOSIS — I058 Other rheumatic mitral valve diseases: Secondary | ICD-10-CM

## 2022-09-21 DIAGNOSIS — R519 Headache, unspecified: Secondary | ICD-10-CM | POA: Diagnosis present

## 2022-09-21 DIAGNOSIS — E875 Hyperkalemia: Secondary | ICD-10-CM | POA: Insufficient documentation

## 2022-09-21 DIAGNOSIS — N186 End stage renal disease: Secondary | ICD-10-CM | POA: Insufficient documentation

## 2022-09-21 DIAGNOSIS — E1122 Type 2 diabetes mellitus with diabetic chronic kidney disease: Secondary | ICD-10-CM | POA: Insufficient documentation

## 2022-09-21 LAB — CBC WITH DIFFERENTIAL/PLATELET
Abs Immature Granulocytes: 0.03 10*3/uL (ref 0.00–0.07)
Basophils Absolute: 0 10*3/uL (ref 0.0–0.1)
Basophils Relative: 0 %
Eosinophils Absolute: 0.7 10*3/uL — ABNORMAL HIGH (ref 0.0–0.5)
Eosinophils Relative: 9 %
HCT: 42 % (ref 36.0–46.0)
Hemoglobin: 12.8 g/dL (ref 12.0–15.0)
Immature Granulocytes: 0 %
Lymphocytes Relative: 24 %
Lymphs Abs: 1.7 10*3/uL (ref 0.7–4.0)
MCH: 31 pg (ref 26.0–34.0)
MCHC: 30.5 g/dL (ref 30.0–36.0)
MCV: 101.7 fL — ABNORMAL HIGH (ref 80.0–100.0)
Monocytes Absolute: 0.7 10*3/uL (ref 0.1–1.0)
Monocytes Relative: 10 %
Neutro Abs: 4 10*3/uL (ref 1.7–7.7)
Neutrophils Relative %: 57 %
Platelets: 261 10*3/uL (ref 150–400)
RBC: 4.13 MIL/uL (ref 3.87–5.11)
RDW: 17.5 % — ABNORMAL HIGH (ref 11.5–15.5)
WBC: 7.2 10*3/uL (ref 4.0–10.5)
nRBC: 0.4 % — ABNORMAL HIGH (ref 0.0–0.2)

## 2022-09-21 LAB — BASIC METABOLIC PANEL
Anion gap: 14 (ref 5–15)
BUN: 46 mg/dL — ABNORMAL HIGH (ref 6–20)
CO2: 24 mmol/L (ref 22–32)
Calcium: 9 mg/dL (ref 8.9–10.3)
Chloride: 102 mmol/L (ref 98–111)
Creatinine, Ser: 13.58 mg/dL — ABNORMAL HIGH (ref 0.44–1.00)
GFR, Estimated: 3 mL/min — ABNORMAL LOW (ref >60)
Glucose, Bld: 155 mg/dL — ABNORMAL HIGH (ref 70–99)
Potassium: 5.3 mmol/L — ABNORMAL HIGH (ref 3.5–5.1)
Sodium: 140 mmol/L (ref 135–145)

## 2022-09-21 LAB — RESP PANEL BY RT-PCR (FLU A&B, COVID) ARPGX2
Influenza A by PCR: NEGATIVE
Influenza B by PCR: NEGATIVE
SARS Coronavirus 2 by RT PCR: NEGATIVE

## 2022-09-21 MED ORDER — PROCHLORPERAZINE EDISYLATE 10 MG/2ML IJ SOLN
10.0000 mg | Freq: Once | INTRAMUSCULAR | Status: AC
Start: 2022-09-21 — End: 2022-09-21
  Administered 2022-09-21: 10 mg via INTRAMUSCULAR
  Filled 2022-09-21: qty 2

## 2022-09-21 MED ORDER — ACETAMINOPHEN 500 MG PO TABS
1000.0000 mg | ORAL_TABLET | Freq: Once | ORAL | Status: AC
  Administered 2022-09-21: 1000 mg via ORAL
  Filled 2022-09-21: qty 2

## 2022-09-21 NOTE — Patient Instructions (Addendum)
You are scheduled for a TEE on Thursday, October 5th with Dr. Marlou Porch.  Please arrive at the [facility] (Main Entrance A) at [facility]: [place], [place] [address] at 10 AM.  DIET: Nothing to eat or drink after midnight except a sip of water with medications (see medication instructions below)  FYI: For your safety, and to allow [name] to monitor your vital signs accurately during the surgery/procedure we request that   if you have artificial nails, gel coating, SNS etc. Please have those removed prior to your surgery/procedure. Not having the nail coverings /polish removed may result in cancellation or delay of your surgery/procedure.   Medication Instructions:  Take 1/2 of your usual dose of insulin the night before [name]) NO insulin AM of procedure   Continue your anticoagulant: Eliquis  You will need to continue your anticoagulant after your procedure until you are told by your  Provider that it is safe to stop   Labs:  AM of procedure at the hospital (BMET, CBC)   You must have a responsible person to drive you home and stay in the waiting area during your procedure. Failure to do so could result in cancellation.  Bring your insurance cards.  *Special Note: Every effort is made to have your procedure done on time. Occasionally there are emergencies that occur at the hospital that may cause delays. Please be patient if a delay does occur.     Follow up in 3 months with Dr. Harl Bowie (as scheduled)

## 2022-09-21 NOTE — ED Triage Notes (Signed)
Reported feeling sick x 2 days; cant smell, cant taste, headache and decreased appetite; HD Q MWF; stated she missed yesterday due to illness.

## 2022-09-21 NOTE — Progress Notes (Signed)
Cardiology Office Note:    Date:  09/21/2022   ID:  Heather [name], DOB 05-26-1971, MRN [id number]  PCP:  Heather Barrack, MD   [facility] Providers Cardiologist:  [name], MD     Referring MD: Heather Barrack, MD   No chief complaint on file. RA thrombus/Pericardial Effusion  History of Present Illness:    Heather [name] is a 51 y.o. female with a hx of ESRD on MWF HD via HD cath, history of CVA, insulin-dependent T2DM, HTN, Charcot foot s/p right BKA, history of PE/DVT , MWF [facility],  anemia of chronic kidney disease  referral for pericardial effusion, and hx of 3 minutes of PEA arrest during IHD catheter placement 01/2022, RA thrombus associated with IHD cather, MSK pain post chest compressions  She's been hospitalized a few times in the last 6 months. She had a dislodged HD catheter on 12/23. K was 6 at that time. CXR confirmed retracted left-sided dialysis catheter with tip extravascular in chest wall. IR was consulted 12/24 for urgent HD catheter placement and dialysis same day.   In January , she had malfunctioning HD catheter and missed to HD sessions. She had a catheter exchange of the left IJ catheter for temporary use on 12/29/2021.  In early February, she went to [facility] for elective HERO graft surgery. She started the procedure and  after 95F catheter was inserted into the IVC, she developed PEA arrest with 3 minutes of chest compressions, epix2. She achieved ROSC. She had intra-op TEE showing pericardial effusion but no tamponade. TEE also noted RA mass c/f thrombus. She was started on eliquis. Lactate went up to 7.   Has not seen cardiology. She does not have heart disease. She had a lexiscan stress test a long time ago. No heart catheterization. She denies CP, shortness of breath. On her CT PE, cac was noted on non gated scan.   [name] still on eliquis 5 mg BID. She started it on Februrary 15th, 2023. The catheter was not replaced.   On crestor 10 mg  daily. No recent LDL.  Family Hx: father has heart disease s/p stent. No heart disease history. Siblings- brother had MI; passed away, and older sister  Social Hx: no smoking hx. She's mainly in her wheelchair. She works from home. She was here with her husband   Interim Hx Echo in May noted to have persistent calcified thrombus in the RA. Also had moderate pericardial effusion without tamponade, this is stable. No evidence of PFO. She saw Dr. Kipp Brood for evaluation for angiovac. The benefit was not felt to outweigh the risks. Today, her blood pressure was quite elevated today 199/98 mmHg.  Interim Hx 09/21/2022 [name] doing well. Dialysis stopped her eliquis. She denies PND/orthopnea. She has a URI.  Planning for TEE, she had recent limited study showing a small mobile mass  on the mitral valve. The RA was not well visualized. Small pericardial effusion. No significant valve insufficiency/stenosis  Cardiology Studies: TTE Limited EF normal; Small mobile mass 0.9 x0.6 cm on the posterior MV annulus Ddx includes calcification vs. vegetation.  At [facility] 01/29/[facility]22- TEE: Large venous sheath visible from SVC traversing the RA and entering the IVC. Large circumferential pericardial effusion without evidence of tamponade. No PFO. Grossly normal mitral, aortic, pulmonic, and tricuspid valve function.   01/29/2022 - limited echo- POSSIBLE MASS ATTACHED TO RA PORT CATH, DOSEN'T UPTAKE DEFINITY CONTRAST    WHEN GIVEN SUGGESTING THROMBUS OR ENDOCARDITIS.  TTE 04/27/2022 EF normal.Normal RV function No significant valve disease Moderate pericardial effusion Calcified RA thrombus remains  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[place]), nonhemorrhagic, inferior right cerebellum 12/03/2017   left side  weakness in leg   Cyst of left ovary 01/12/2018   Diabetic neuropathy, painful ([place]), on low dose Gabapentin 07/13/2013   DM (diabetes mellitus), type 2, uncontrolled, with renal complications 37/09/6268   DM2 (diabetes mellitus, type 2) ([place])    Dysfunction of left eustachian tube, with pusatile tinnitus 11/24/2017   Dyslipidemia associated with type 2 diabetes mellitus ([place]) 06/25/2016   ESRD (end stage renal disease) on dialysis [facility])    On hemodialysis in July 2019 via [facility] then switched to [facility] in Nov 2019.    ESRD with anemia (HCC)    Fibromyalgia    Gastroparesis due to DM    GERD (gastroesophageal reflux disease)    Headache    Hypertension associated with diabetes ([place]) 02/19/2011   IBS (irritable bowel syndrome)    Left-sided weakness 12/10/2017   .   Leiomyoma of uterus    Pancreatitis    PE (pulmonary thromboembolism) ([place]) 11/04/2019   Pneumonia    Proliferative diabetic retinopathy ([place]) 09/29/2015   Pronation deformity of both feet 06/14/2014   Reactive depression 12/10/2017   .   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[place]) 02/17/2018   Thromboembolism ([place]) 04/05/2018   Upper airway cough syndrome, with recs to stay off ACE and take Pepcid q hs 11/15/2016    Past Surgical History:  Procedure Laterality Date   A/V FISTULAGRAM Right 03/03/2020   Procedure: Venogram;  Surgeon: Waynetta Sandy, MD;  Location: [place] [facility];  Service: Cardiovascular;  Laterality: Right;   ARTERY REPAIR Right 02/17/2018   Procedure: EXPLORATION OF RIGHT BRACHIAL ARTERY;  Surgeon: Conrad H. [place], MD;  Location: [place];  Service: Vascular;  Laterality: Right;   ARTERY REPAIR Right 02/17/2018   Procedure: BRACHIAL ARTERY EXPLORATION AND TRHOMBECTOMY;  Surgeon: Conrad Kaltag, MD;  Location: [place];  Service: Vascular;  Laterality: Right;   AV FISTULA PLACEMENT Left 05/09/2017   Procedure:  INSERTION OF ARTERIOVENOUS (AV) GRAFT ARM (ARTEGRAFT);  Surgeon: Conrad Hercules, MD;  Location: [facility] OR;  Service: Vascular;  Laterality: Left;   AV FISTULA PLACEMENT Right 02/17/2018   Procedure: INSERTION OF ARTERIOVENOUS (AV) GORE-TEX GRAFT ARM RIGHT UPPER ARM;  Surgeon: Conrad [place] Gates, MD;  Location: [place];  Service: Vascular;  Laterality: Right;   AV FISTULA PLACEMENT Left 10/10/2019   Procedure: INSERTION OF LEFT ARTERIOVENOUS (AV) ARTEGRAFT GRAFT ARM;  Surgeon: Waynetta Sandy, MD;  Location: [place];  Service: Vascular;  Laterality: Left;   AV FISTULA PLACEMENT Right 03/19/2020   Procedure: INSERTION OF ARTERIOVENOUS (AV) GORE-TEX GRAFT IN RIGHT ARM AND VIABAHN STENT IN RIGHT AXILLARY ARTERY;  Surgeon: Waynetta Sandy, MD;  Location: [place];  Service: Vascular;  Laterality: Right;   [place] Left 08/31/2016   Procedure: BASILIC VEIN TRANSPOSITION FIRST STAGE;  Surgeon: Conrad , MD;  Location: [place];  Service: Vascular;  Laterality: Left;   BRONCHIAL WASHINGS  09/17/2021   Procedure: BRONCHIAL WASHINGS;  Surgeon: Collene Gobble, MD;  Location: [facility];  Service:  Cardiopulmonary;;   CENTRAL VENOUS CATHETER INSERTION Left 07/24/2018   Procedure: INSERTION CENTRAL LINE ADULT;  Surgeon: Conrad Wilberforce, MD;  Location: [place];  Service: Vascular;  Laterality: Left;   EYE SURGERY     secondary to diabetic retinopathy    FOOT SURGERY Right    t"ook bone out- maybe hammer toe"   INSERTION OF DIALYSIS CATHETER Left 07/24/2018   Procedure: INSERTION OF TUNNELED DIALYSIS CATHETER;  Surgeon: Conrad Innsbrook, MD;  Location: [place];  Service: Vascular;  Laterality: Left;   IR FLUORO GUIDE CV LINE LEFT  12/19/2021   IR FLUORO GUIDE CV LINE LEFT  12/29/2021   IR FLUORO GUIDE CV LINE RIGHT  07/21/2018   IR FLUORO GUIDE CV LINE RIGHT  06/01/2019   IR FLUORO GUIDE CV LINE RIGHT  05/05/2020   IR REMOVAL TUN CV CATH W/O FL  01/23/2021   IR REMOVAL TUN CV CATH W/O FL  12/19/2021    IR US GUIDE VASC ACCESS LEFT  12/19/2021   IR US GUIDE VASC ACCESS RIGHT  07/21/2018   IR US GUIDE VASC ACCESS RIGHT  06/01/2019   LIGATION ARTERIOVENOUS GORTEX GRAFT Right 03/20/2020   Procedure: LIGATION ARM ARTERIOVENOUS GORTEX GRAFT With Patch Angioplasty, Thrombectomy;  Surgeon: Waynetta Sandy, MD;  Location: [place];  Service: Vascular;  Laterality: Right;   LIGATION OF ARTERIOVENOUS  FISTULA Left 05/09/2017   Procedure: LIGATION OF ARTERIOVENOUS  FISTULA;  Surgeon: Conrad Edwardsburg, MD;  Location: [place];  Service: Vascular;  Laterality: Left;   LOOP RECORDER INSERTION N/A 12/05/2017   Procedure: LOOP RECORDER INSERTION;  Surgeon: Evans Lance, MD;  Location: [place] [facility];  Service: Cardiovascular;  Laterality: N/A;   MYOMECTOMY     REMOVAL OF GRAFT Right 02/17/2018   Procedure: REMOVAL OF RIGHT UPPER ARM ARTERIOVENOUS GRAFT;  Surgeon: Conrad Rippey, MD;  Location: [place];  Service: Vascular;  Laterality: Right;   REVISION OF ARTERIOVENOUS GORETEX GRAFT Left 07/24/2018   Procedure: REDO ARTERIOVENOUS GORETEX GRAFT;  Surgeon: Conrad Winthrop, MD;  Location: [place];  Service: Vascular;  Laterality: Left;   TEE WITHOUT CARDIOVERSION N/A 12/05/2017   Procedure: TRANSESOPHAGEAL ECHOCARDIOGRAM (TEE);  Surgeon: Sanda Klein, MD;  Location: [place];  Service: Cardiovascular;  Laterality: N/A;   UPPER EXTREMITY VENOGRAPHY Left 07/13/2018   Procedure: UPPER EXTREMITY VENOGRAPHY - Central & Left Arm;  Surgeon: Conrad Kankakee, MD;  Location: [place] [facility];  Service: Cardiovascular;  Laterality: Left;   UPPER EXTREMITY VENOGRAPHY Bilateral 09/10/2019   Procedure: UPPER EXTREMITY VENOGRAPHY;  Surgeon: Waynetta Sandy, MD;  Location: [place] [facility];  Service: Cardiovascular;  Laterality: Bilateral;   UTERINE FIBROID SURGERY     VIDEO BRONCHOSCOPY N/A 09/17/2021   Procedure: VIDEO BRONCHOSCOPY WITHOUT FLUORO;  Surgeon: Collene Gobble, MD;  [facility];  Service:  Cardiopulmonary;  Laterality: N/A;   VITRECTOMY Bilateral     Current Medications: Current Meds  Medication Sig   amLODipine-olmesartan (AZOR) 5-20 MG tablet TAKE 1 TABLET BY MOUTH EVERY DAY   B Complex-C-Zn-Folic Acid (DIALYVITE 476-LYYT 15) 0.8 MG TABS Take 1 tablet by mouth daily.   calcium acetate (PHOSLO) 667 MG capsule Take 667 mg by mouth 3 (three) times daily with meals.   Continuous Blood Gluc Sensor (FREESTYLE LIBRE 14 DAY SENSOR) MISC 1 patch by Does not apply route every 14 (fourteen) days.   fluticasone (FLONASE) 50 MCG/ACT nasal spray Place 2 sprays into both nostrils daily.  gabapentin (NEURONTIN) 300 MG capsule TAKE 1 CAPSULE (300 MG TOTAL) BY MOUTH DAILY AS NEEDED. TAKE AFTER DIALYSIS. DO NOT TAKE MORE THAN THREE TIMES WEEKLY.   insulin aspart (NOVOLOG FLEXPEN) 100 UNIT/ML FlexPen Inject 6 Units into the skin 3 (three) times daily with meals. Sliding scale   insulin degludec (TRESIBA FLEXTOUCH) 100 UNIT/ML FlexTouch Pen Inject 26 Units into the skin at bedtime.   Insulin Pen Needle 29G X 5MM MISC 1 Device by Does not apply route 4 (four) times daily.     Allergies:   Ibuprofen, Dilaudid [hydromorphone hcl], Iodinated contrast media, and Tramadol   Social History   Socioeconomic History   Marital status: Married    Spouse name: Not on file   Number of children: 1   Years of education: college   Highest education level: Not on file  Occupational History   Occupation: customer service rep    Employer: [employer]  Tobacco Use   Smoking status: Never   Smokeless tobacco: Never  Vaping Use   Vaping Use: Never used  Substance and Sexual Activity   Alcohol use: No    Alcohol/week: 0.0 standard drinks of alcohol   Drug use: No   Sexual activity: Not Currently    Partners: Male    Birth control/protection: I.U.D.  Other Topics Concern   Not on file  Social History Narrative   Patient lives with fiance. She has 1 grown son. Works full-time, she Biomedical scientist for attorney.   Caffeine Use: 2 cups daily; sodas occasionally   She has 7 grandchildren   Social Determinants of Radio broadcast assistant Strain: Not on file  Food Insecurity: Not on file  Transportation Needs: Not on file  Physical Activity: Not on file  Stress: Not on file  Social Connections: Not on file     Family History: The patient's family history includes Colon polyps in her mother; Diabetes in her brother, mother, sister, and sister; Heart murmur in her father and son; Hypertension in her father; Kidney failure in her sister; Retinal degeneration in her brother; Stroke in her paternal grandmother.  ROS:   Please see the history of present illness.     All other systems reviewed and are negative.  EKGs/Labs/Other Studies Reviewed:    The following studies were reviewed today:   EKG:  EKG is  ordered today.  The ekg ordered today demonstrates   Prior EKG, NSR, low voltage, poor R wave progression  Recent Labs: 12/28/2021: ALT 11 12/30/2021: Magnesium 2.1 02/03/2022: BUN 27; Creatinine, Ser 7.88; Hemoglobin 11.6; Platelets 164; Potassium 3.7; Sodium 136   Recent Lipid Panel    Component Value Date/Time   CHOL 249 (H) 12/03/2017 0409   CHOL 277 02/13/2013 0000   TRIG 158 (H) 12/03/2017 0409   TRIG 200 02/13/2013 0000   HDL 48 12/03/2017 0409   CHOLHDL 5.2 12/03/2017 0409   VLDL 32 12/03/2017 0409   LDLCALC 169 (H) 12/03/2017 0409   LDLCALC 195 02/13/2013 0000   LDLDIRECT 120.0 04/17/2015 1141     Risk Assessment/Calculations:           Physical Exam:    VS:   NA  Wt Readings from Last 3 Encounters:  09/21/22 236 lb (107 kg)  08/18/22 217 lb (98.4 kg)  07/02/22 217 lb 12.8 oz (98.8 kg)    NA  ASSESSMENT:    RA thrombus associated with IHD catheter. Seen on TTE at [facility] in February, 2023. It has persisted  and likely calcified. Plan is to discuss anticoagulation after her TEE  Mitral valve Mass: ddx calcification v vegetation v thrombus.  No known PFO. Will get TEE  #Pericardial effusion: thought uremic in the setting of ESRD/dialysis associated. No tamponade physiology. No signs of pericarditis. Stable  #MSK pain: recommended extra strength tylenol. PCP started steroid.  HLD; continue crestor 10 ( not taking) ( no strong data with IHD patients, this is ok)  HTN: Bp typically well controlled.Was elevated during her last visit.  Gave metop 50 mg tartrate. Will continue norvasc '5mg'$ - olmesartan 20 mg daily.    PLAN:    In order of problems listed above:   TEE (assess RA, mitral valve) Follow up in 3 months       Medication Adjustments/Labs and Tests Ordered: Current medicines are reviewed at length with the patient today.  Concerns regarding medicines are outlined above.  No orders of the defined types were placed in this encounter.  No orders of the defined types were placed in this encounter.   Patient Instructions  You are scheduled for a TEE on Thursday, October 5th with Dr. Marlou Porch.  Please arrive at the [facility] (Main Entrance A) at [facility]: [place], [place] [address] at 10 AM.  DIET: Nothing to eat or drink after midnight except a sip of water with medications (see medication instructions below)  FYI: For your safety, and to allow [name] to monitor your vital signs accurately during the surgery/procedure we request that   if you have artificial nails, gel coating, SNS etc. Please have those removed prior to your surgery/procedure. Not having the nail coverings /polish removed may result in cancellation or delay of your surgery/procedure.   Medication Instructions:  Take 1/2 of your usual dose of insulin the night before [name]) NO insulin AM of procedure   Continue your anticoagulant: Eliquis  You will need to continue your anticoagulant after your procedure until you are told by your  Provider that it is safe to stop   Labs:  AM of procedure at the hospital (BMET,  CBC)   You must have a responsible person to drive you home and stay in the waiting area during your procedure. Failure to do so could result in cancellation.  Bring your insurance cards.  *Special Note: Every effort is made to have your procedure done on time. Occasionally there are emergencies that occur at the hospital that may cause delays. Please be patient if a delay does occur.     Follow up in 3 months with Dr. Harl Bowie (as scheduled)    Signed, [name], MD  09/21/2022 2:40 PM    [place]

## 2022-09-21 NOTE — H&P (View-Only) (Signed)
Cardiology Office Note:    Date:  09/21/2022   ID:  Heather [name], DOB 09-Feb-1971, MRN [id number]  PCP:  [name], MD   [facility] Providers Cardiologist:  [name], MD     Referring MD: [name], MD   No chief complaint on file. RA thrombus/Pericardial Effusion  History of Present Illness:    Heather [name] is a 51 y.o. female with a hx of ESRD on MWF HD via HD cath, history of CVA, insulin-dependent T2DM, HTN, Charcot foot s/p right BKA, history of PE/DVT , MWF [facility],  anemia of chronic kidney disease  referral for pericardial effusion, and hx of 3 minutes of PEA arrest during IHD catheter placement 01/2022, RA thrombus associated with IHD cather, MSK pain post chest compressions  She's been hospitalized a few times in the last 6 months. She had a dislodged HD catheter on 12/23. K was 6 at that time. CXR confirmed retracted left-sided dialysis catheter with tip extravascular in chest wall. IR was consulted 12/24 for urgent HD catheter placement and dialysis same day.   In January , she had malfunctioning HD catheter and missed to HD sessions. She had a catheter exchange of the left IJ catheter for temporary use on 12/29/2021.  In early February, she went to [facility] for elective HERO graft surgery. She started the procedure and  after 26F catheter was inserted into the IVC, she developed PEA arrest with 3 minutes of chest compressions, epix2. She achieved ROSC. She had intra-op TEE showing pericardial effusion but no tamponade. TEE also noted RA mass c/f thrombus. She was started on eliquis. Lactate went up to 7.   Has not seen cardiology. She does not have heart disease. She had a lexiscan stress test a long time ago. No heart catheterization. She denies CP, shortness of breath. On her CT PE, cac was noted on non gated scan.   [name] still on eliquis 5 mg BID. She started it on Februrary 15th, 2023. The catheter was not replaced.   On crestor 10 mg  daily. No recent LDL.  Family Hx: father has heart disease s/p stent. No heart disease history. Siblings- brother had MI; passed away, and older sister  Social Hx: no smoking hx. She's mainly in her wheelchair. She works from home. She was here with her husband   Interim Hx Echo in May noted to have persistent calcified thrombus in the RA. Also had moderate pericardial effusion without tamponade, this is stable. No evidence of PFO. She saw Dr. Kipp Brood for evaluation for angiovac. The benefit was not felt to outweigh the risks. Today, her blood pressure was quite elevated today 199/98 mmHg.  Interim Hx 09/21/2022 [name] doing well. Dialysis stopped her eliquis. She denies PND/orthopnea. She has a URI.  Planning for TEE, she had recent limited study showing a small mobile mass  on the mitral valve. The RA was not well visualized. Small pericardial effusion. No significant valve insufficiency/stenosis  Cardiology Studies: TTE Limited EF normal; Small mobile mass 0.9 x0.6 cm on the posterior MV annulus Ddx includes calcification vs. vegetation.  At [facility] 01/29/2022- TEE: Large venous sheath visible from SVC traversing the RA and entering the IVC. Large circumferential pericardial effusion without evidence of tamponade. No PFO. Grossly normal mitral, aortic, pulmonic, and tricuspid valve function.   01/29/2022 - limited echo- POSSIBLE MASS ATTACHED TO RA PORT CATH, DOSEN'T UPTAKE DEFINITY CONTRAST    WHEN GIVEN SUGGESTING THROMBUS OR ENDOCARDITIS.  TTE 04/27/2022 EF normal.Normal RV function No significant valve disease Moderate pericardial effusion Calcified RA thrombus remains  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[place]), nonhemorrhagic, inferior right cerebellum 12/03/2017   left side  weakness in leg   Cyst of left ovary 01/12/2018   Diabetic neuropathy, painful ([place]), on low dose Gabapentin 07/13/2013   DM (diabetes mellitus), type 2, uncontrolled, with renal complications 63/12/6008   DM2 (diabetes mellitus, type 2) ([place])    Dysfunction of left eustachian tube, with pusatile tinnitus 11/24/2017   Dyslipidemia associated with type 2 diabetes mellitus ([place]) 06/25/2016   ESRD (end stage renal disease) on dialysis [facility])    On hemodialysis in July 2019 via [facility] then switched to CCPD in Nov 2019.    ESRD with anemia (HCC)    Fibromyalgia    Gastroparesis due to DM    GERD (gastroesophageal reflux disease)    Headache    Hypertension associated with diabetes ([place]) 02/19/2011   IBS (irritable bowel syndrome)    Left-sided weakness 12/10/2017   .   Leiomyoma of uterus    Pancreatitis    PE (pulmonary thromboembolism) ([place]) 11/04/2019   Pneumonia    Proliferative diabetic retinopathy ([place]) 09/29/2015   Pronation deformity of both feet 06/14/2014   Reactive depression 12/10/2017   .   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[place]) 02/17/2018   Thromboembolism ([place]) 04/05/2018   Upper airway cough syndrome, with recs to stay off ACE and take Pepcid q hs 11/15/2016    Past Surgical History:  Procedure Laterality Date   A/V FISTULAGRAM Right 03/03/2020   Procedure: Venogram;  Surgeon: Waynetta Sandy, MD;  Location: [place] [facility];  Service: Cardiovascular;  Laterality: Right;   ARTERY REPAIR Right 02/17/2018   Procedure: EXPLORATION OF RIGHT BRACHIAL ARTERY;  Surgeon: Conrad Carteret, MD;  Location: [place];  Service: Vascular;  Laterality: Right;   ARTERY REPAIR Right 02/17/2018   Procedure: BRACHIAL ARTERY EXPLORATION AND TRHOMBECTOMY;  Surgeon: Conrad Pipestone, MD;  Location: [place];  Service: Vascular;  Laterality: Right;   AV FISTULA PLACEMENT Left 05/09/2017   Procedure:  INSERTION OF ARTERIOVENOUS (AV) GRAFT ARM (ARTEGRAFT);  Surgeon: Conrad St. Stephen, MD;  Location: [facility] OR;  Service: Vascular;  Laterality: Left;   AV FISTULA PLACEMENT Right 02/17/2018   Procedure: INSERTION OF ARTERIOVENOUS (AV) GORE-TEX GRAFT ARM RIGHT UPPER ARM;  Surgeon: Conrad Bro[place]ville, MD;  Location: [place];  Service: Vascular;  Laterality: Right;   AV FISTULA PLACEMENT Left 10/10/2019   Procedure: INSERTION OF LEFT ARTERIOVENOUS (AV) ARTEGRAFT GRAFT ARM;  Surgeon: Waynetta Sandy, MD;  Location: [place];  Service: Vascular;  Laterality: Left;   AV FISTULA PLACEMENT Right 03/19/2020   Procedure: INSERTION OF ARTERIOVENOUS (AV) GORE-TEX GRAFT IN RIGHT ARM AND VIABAHN STENT IN RIGHT AXILLARY ARTERY;  Surgeon: Waynetta Sandy, MD;  Location: [place];  Service: Vascular;  Laterality: Right;   [place] Left 08/31/2016   Procedure: BASILIC VEIN TRANSPOSITION FIRST STAGE;  Surgeon: Conrad , MD;  Location: [place];  Service: Vascular;  Laterality: Left;   BRONCHIAL WASHINGS  09/17/2021   Procedure: BRONCHIAL WASHINGS;  Surgeon: Collene Gobble, MD;  Location: [facility];  Service:  Cardiopulmonary;;   CENTRAL VENOUS CATHETER INSERTION Left 07/24/2018   Procedure: INSERTION CENTRAL LINE ADULT;  Surgeon: Conrad Aromas, MD;  Location: [place];  Service: Vascular;  Laterality: Left;   EYE SURGERY     secondary to diabetic retinopathy    FOOT SURGERY Right    t"ook bone out- maybe hammer toe"   INSERTION OF DIALYSIS CATHETER Left 07/24/2018   Procedure: INSERTION OF TUNNELED DIALYSIS CATHETER;  Surgeon: Conrad Tallassee, MD;  Location: [place];  Service: Vascular;  Laterality: Left;   IR FLUORO GUIDE CV LINE LEFT  12/19/2021   IR FLUORO GUIDE CV LINE LEFT  12/29/2021   IR FLUORO GUIDE CV LINE RIGHT  07/21/2018   IR FLUORO GUIDE CV LINE RIGHT  06/01/2019   IR FLUORO GUIDE CV LINE RIGHT  05/05/2020   IR REMOVAL TUN CV CATH W/O FL  01/23/2021   IR REMOVAL TUN CV CATH W/O FL  12/19/2021    IR US GUIDE VASC ACCESS LEFT  12/19/2021   IR US GUIDE VASC ACCESS RIGHT  07/21/2018   IR US GUIDE VASC ACCESS RIGHT  06/01/2019   LIGATION ARTERIOVENOUS GORTEX GRAFT Right 03/20/2020   Procedure: LIGATION ARM ARTERIOVENOUS GORTEX GRAFT With Patch Angioplasty, Thrombectomy;  Surgeon: Waynetta Sandy, MD;  Location: [place];  Service: Vascular;  Laterality: Right;   LIGATION OF ARTERIOVENOUS  FISTULA Left 05/09/2017   Procedure: LIGATION OF ARTERIOVENOUS  FISTULA;  Surgeon: Conrad Oakwood, MD;  Location: [place];  Service: Vascular;  Laterality: Left;   LOOP RECORDER INSERTION N/A 12/05/2017   Procedure: LOOP RECORDER INSERTION;  Surgeon: Evans Lance, MD;  Location: [place] CV [facility];  Service: Cardiovascular;  Laterality: N/A;   MYOMECTOMY     REMOVAL OF GRAFT Right 02/17/2018   Procedure: REMOVAL OF RIGHT UPPER ARM ARTERIOVENOUS GRAFT;  Surgeon: Conrad Ambrose, MD;  Location: [place];  Service: Vascular;  Laterality: Right;   REVISION OF ARTERIOVENOUS GORETEX GRAFT Left 07/24/2018   Procedure: REDO ARTERIOVENOUS GORETEX GRAFT;  Surgeon: Conrad [place], MD;  Location: [place];  Service: Vascular;  Laterality: Left;   TEE WITHOUT CARDIOVERSION N/A 12/05/2017   Procedure: TRANSESOPHAGEAL ECHOCARDIOGRAM (TEE);  Surgeon: Sanda Klein, MD;  Location: [place];  Service: Cardiovascular;  Laterality: N/A;   UPPER EXTREMITY VENOGRAPHY Left 07/13/2018   Procedure: UPPER EXTREMITY VENOGRAPHY - Central & Left Arm;  Surgeon: Conrad [place]ake, MD;  Location: [place] [facility];  Service: Cardiovascular;  Laterality: Left;   UPPER EXTREMITY VENOGRAPHY Bilateral 09/10/2019   Procedure: UPPER EXTREMITY VENOGRAPHY;  Surgeon: Waynetta Sandy, MD;  Location: [place] [facility];  Service: Cardiovascular;  Laterality: Bilateral;   UTERINE FIBROID SURGERY     VIDEO BRONCHOSCOPY N/A 09/17/2021   Procedure: VIDEO BRONCHOSCOPY WITHOUT FLUORO;  Surgeon: Collene Gobble, MD;  [facility];  Service:  Cardiopulmonary;  Laterality: N/A;   VITRECTOMY Bilateral     Current Medications: Current Meds  Medication Sig   amLODipine-olmesartan (AZOR) 5-20 MG tablet TAKE 1 TABLET BY MOUTH EVERY DAY   B Complex-C-Zn-Folic Acid (DIALYVITE 737-TGGY 15) 0.8 MG TABS Take 1 tablet by mouth daily.   calcium acetate (PHOSLO) 667 MG capsule Take 667 mg by mouth 3 (three) times daily with meals.   Continuous Blood Gluc Sensor (FREESTYLE LIBRE 14 DAY SENSOR) MISC 1 patch by Does not apply route every 14 (fourteen) days.   fluticasone (FLONASE) 50 MCG/ACT nasal spray Place 2 sprays into both nostrils daily.  gabapentin (NEURONTIN) 300 MG capsule TAKE 1 CAPSULE (300 MG TOTAL) BY MOUTH DAILY AS NEEDED. TAKE AFTER DIALYSIS. DO NOT TAKE MORE THAN THREE TIMES WEEKLY.   insulin aspart (NOVOLOG FLEXPEN) 100 UNIT/ML FlexPen Inject 6 Units into the skin 3 (three) times daily with meals. Sliding scale   insulin degludec (TRESIBA FLEXTOUCH) 100 UNIT/ML FlexTouch Pen Inject 26 Units into the skin at bedtime.   Insulin Pen Needle 29G X 5MM MISC 1 Device by Does not apply route 4 (four) times daily.     Allergies:   Ibuprofen, Dilaudid [hydromorphone hcl], Iodinated contrast media, and Tramadol   Social History   Socioeconomic History   Marital status: Married    Spouse name: Not on file   Number of children: 1   Years of education: college   Highest education level: Not on file  Occupational History   Occupation: customer service rep    Employer: [employer]  Tobacco Use   Smoking status: Never   Smokeless tobacco: Never  Vaping Use   Vaping Use: Never used  Substance and Sexual Activity   Alcohol use: No    Alcohol/week: 0.0 standard drinks of alcohol   Drug use: No   Sexual activity: Not Currently    Partners: Male    Birth control/protection: I.U.D.  Other Topics Concern   Not on file  Social History Narrative   Patient lives with fiance. She has 1 grown son. Works full-time, she Biomedical scientist for attorney.   Caffeine Use: 2 cups daily; sodas occasionally   She has 7 grandchildren   Social Determinants of Radio broadcast assistant Strain: Not on file  Food Insecurity: Not on file  Transportation Needs: Not on file  Physical Activity: Not on file  Stress: Not on file  Social Connections: Not on file     Family History: The patient's family history includes Colon polyps in her mother; Diabetes in her brother, mother, sister, and sister; Heart murmur in her father and son; Hypertension in her father; Kidney failure in her sister; Retinal degeneration in her brother; Stroke in her paternal grandmother.  ROS:   Please see the history of present illness.     All other systems reviewed and are negative.  EKGs/Labs/Other Studies Reviewed:    The following studies were reviewed today:   EKG:  EKG is  ordered today.  The ekg ordered today demonstrates   Prior EKG, NSR, low voltage, poor R wave progression  Recent Labs: 12/28/2021: ALT 11 12/30/2021: Magnesium 2.1 02/03/2022: BUN 27; Creatinine, Ser 7.88; Hemoglobin 11.6; Platelets 164; Potassium 3.7; Sodium 136   Recent Lipid Panel    Component Value Date/Time   CHOL 249 (H) 12/03/2017 0409   CHOL 277 02/13/2013 0000   TRIG 158 (H) 12/03/2017 0409   TRIG 200 02/13/2013 0000   HDL 48 12/03/2017 0409   CHOLHDL 5.2 12/03/2017 0409   VLDL 32 12/03/2017 0409   LDLCALC 169 (H) 12/03/2017 0409   LDLCALC 195 02/13/2013 0000   LDLDIRECT 120.0 04/17/2015 1141     Risk Assessment/Calculations:           Physical Exam:    VS:   NA  Wt Readings from Last 3 Encounters:  09/21/22 236 lb (107 kg)  08/18/22 217 lb (98.4 kg)  07/02/22 217 lb 12.8 oz (98.8 kg)    NA  ASSESSMENT:    RA thrombus associated with IHD catheter. Seen on TTE at [facility] in February, 2023. It has persisted  and likely calcified. Plan is to discuss anticoagulation after her TEE  Mitral valve Mass: ddx calcification v vegetation v thrombus.  No known PFO. Will get TEE  #Pericardial effusion: thought uremic in the setting of ESRD/dialysis associated. No tamponade physiology. No signs of pericarditis. Stable  #MSK pain: recommended extra strength tylenol. PCP started steroid.  HLD; continue crestor 10 ( not taking) ( no strong data with IHD patients, this is ok)  HTN: Bp typically well controlled.Was elevated during her last visit.  Gave metop 50 mg tartrate. Will continue norvasc '5mg'$ - olmesartan 20 mg daily.    PLAN:    In order of problems listed above:   TEE (assess RA, mitral valve) Follow up in 3 months       Medication Adjustments/Labs and Tests Ordered: Current medicines are reviewed at length with the patient today.  Concerns regarding medicines are outlined above.  No orders of the defined types were placed in this encounter.  No orders of the defined types were placed in this encounter.   Patient Instructions  You are scheduled for a TEE on Thursday, October 5th with Dr. Marlou Porch.  Please arrive at the [facility] (Main Entrance A) at [facility]: [place], [place] [address] at 10 AM.  DIET: Nothing to eat or drink after midnight except a sip of water with medications (see medication instructions below)  FYI: For your safety, and to allow [name] to monitor your vital signs accurately during the surgery/procedure we request that   if you have artificial nails, gel coating, SNS etc. Please have those removed prior to your surgery/procedure. Not having the nail coverings /polish removed may result in cancellation or delay of your surgery/procedure.   Medication Instructions:  Take 1/2 of your usual dose of insulin the night before [name]) NO insulin AM of procedure   Continue your anticoagulant: Eliquis  You will need to continue your anticoagulant after your procedure until you are told by your  Provider that it is safe to stop   Labs:  AM of procedure at the hospital (BMET,  CBC)   You must have a responsible person to drive you home and stay in the waiting area during your procedure. Failure to do so could result in cancellation.  Bring your insurance cards.  *Special Note: Every effort is made to have your procedure done on time. Occasionally there are emergencies that occur at the hospital that may cause delays. Please be patient if a delay does occur.     Follow up in 3 months with Dr. Harl Bowie (as scheduled)    Signed, [name], MD  09/21/2022 2:40 PM    [place]

## 2022-09-21 NOTE — ED Provider Notes (Signed)
Emergency Department Provider Note   I have reviewed the triage vital signs and the nursing notes.   HISTORY  Chief Complaint Headache   HPI Heather [name] is a 51 y.o. female presents to the ED with HA. Reports loss of smell/taste along with congestion. No fever. Does have some decreased appetite. No neck stiffness. No vision change. Patient is an ESRD patient and missed yesterday's HD session. No SOB. No AMS.    Past Medical History:  Diagnosis Date   Antiplatelet or antithrombotic Myrla Malanowski-term use: Plavix 06/30/2017   B12 deficiency 05/10/2014   Blood transfusion without reported diagnosis    Chronic constipation    CVA (cerebral vascular accident) ([place]), nonhemorrhagic, inferior right cerebellum 12/03/2017   left side weakness in leg   Cyst of left ovary 01/12/2018   Diabetic neuropathy, painful ([place]), on low dose Gabapentin 07/13/2013   DM (diabetes mellitus), type 2, uncontrolled, with renal complications 51/88/4166   DM2 (diabetes mellitus, type 2) ([place])    Dysfunction of left eustachian tube, with pusatile tinnitus 11/24/2017   Dyslipidemia associated with type 2 diabetes mellitus ([place]) 06/25/2016   ESRD (end stage renal disease) on dialysis [facility])    On hemodialysis in July 2019 via [facility] then switched to CCPD in Nov 2019.    ESRD with anemia (HCC)    Fibromyalgia    Gastroparesis due to DM    GERD (gastroesophageal reflux disease)    Headache    Hypertension associated with diabetes ([place]) 02/19/2011   IBS (irritable bowel syndrome)    Left-sided weakness 12/10/2017   .   Leiomyoma of uterus    Pancreatitis    PE (pulmonary thromboembolism) ([place]) 11/04/2019   Pneumonia    Proliferative diabetic retinopathy ([place]) 09/29/2015   Pronation deformity of both feet 06/14/2014   Reactive depression 12/10/2017   .   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[place]) 02/17/2018    Thromboembolism ([place]) 04/05/2018   Upper airway cough syndrome, with recs to stay off ACE and take Pepcid q hs 11/15/2016    Review of Systems  Constitutional: No fever/chills Eyes: No visual changes. ENT: No sore throat. Positive congestion.  Cardiovascular: Denies chest pain. Respiratory: Denies shortness of breath. Positive cough.  Gastrointestinal: No abdominal pain.  No nausea, no vomiting.  Musculoskeletal: Negative for back pain. Skin: Negative for rash. Neurological: Positive HA.   ____________________________________________   PHYSICAL EXAM:  VITAL SIGNS: ED Triage Vitals  Enc Vitals Group     BP 09/21/22 1814 (!) 193/99     Pulse Rate 09/21/22 1814 88     Resp 09/21/22 1814 18     Temp 09/21/22 1814 98.2 F (36.8 C)     Temp Source 09/21/22 1814 Oral     SpO2 09/21/22 1814 95 %   Constitutional: Alert and oriented. Well appearing and in no acute distress. Eyes: Conjunctivae are normal.  Head: Atraumatic. Nose: No congestion/rhinnorhea. Mouth/Throat: Mucous membranes are moist. Neck: No stridor.   Cardiovascular: Normal rate, regular rhythm. Good peripheral circulation. Grossly normal heart sounds.   Respiratory: Normal respiratory effort.  No retractions. Lungs CTAB. Gastrointestinal: Soft and nontender. No distention.  Musculoskeletal: No lower extremity tenderness nor edema. No gross deformities of extremities. Neurologic:  Normal speech and language. No gross focal neurologic deficits are appreciated.  Skin:  Skin is warm, dry and intact. No rash noted.  ____________________________________________   LABS (all labs ordered are listed,  but only abnormal results are displayed)  Labs Reviewed  BASIC METABOLIC PANEL - Abnormal; Notable for the following components:      Result Value   Potassium 5.3 (*)    Glucose, Bld 155 (*)    BUN 46 (*)    Creatinine, Ser 13.58 (*)    GFR, Estimated 3 (*)    All other components within normal limits  CBC WITH  DIFFERENTIAL/PLATELET - Abnormal; Notable for the following components:   MCV 101.7 (*)    RDW 17.5 (*)    nRBC 0.4 (*)    Eosinophils Absolute 0.7 (*)    All other components within normal limits  RESP PANEL BY RT-PCR (FLU A&B, COVID) [id number]     ____________________________________________  RADIOLOGY  DG Chest Portable 1 View  Result Date: 09/21/2022 CLINICAL DATA:  Chest pain and feeling sick EXAM: PORTABLE CHEST 1 VIEW COMPARISON:  Radiographs 08/18/2022 FINDINGS: Unchanged left IJ dialysis catheter tip in the right atrium. Stable cardiomegaly. Stable vascular congestion. No focal opacity, pleural effusion, or pneumothorax. Loop recorder. Right axillary stent. IMPRESSION: No change from 08/18/2022. Cardiomegaly and pulmonary vascular congestion. Electronically Signed   By: Placido Sou M.D.   On: 09/21/2022 22:18    ____________________________________________   PROCEDURES  Procedure(s) performed:   Procedures  none ____________________________________________   INITIAL IMPRESSION / ASSESSMENT AND PLAN / ED COURSE  Pertinent labs & imaging results that were available during my care of the patient were reviewed by me and considered in my medical decision making (see chart for details).   This patient is Presenting for Evaluation of HA, which does require a range of treatment options, and is a complaint that involves a high risk of morbidity and mortality.  The Differential Diagnoses includes but is not exclusive to subarachnoid hemorrhage, meningitis, encephalitis, previous head trauma, cavernous venous thrombosis, muscle tension headache, glaucoma, temporal arteritis, migraine or migraine equivalent, etc.   Critical Interventions-    Medications  prochlorperazine (COMPAZINE) injection 10 mg (10 mg Intramuscular Given 09/21/22 2305)  acetaminophen (TYLENOL) tablet 1,000 mg (1,000 mg Oral Given 09/21/22 2226)    Reassessment after intervention: Symptoms improved.     I did obtain Additional Historical Information from husband at bedside.    Clinical Laboratory Tests Ordered, included mild hyperkalemia of 5.3. Creatinine similar to prior values. No anemia. COVID and Flu negative.   Radiologic Tests Ordered, included CXR. I independently interpreted the images and agree with radiology interpretation.   Cardiac Monitor Tracing which shows NSR.   Social Determinants of Health Risk patient is a non-smoker.   Medical Decision Making: Summary:  Patient presents to the ED with HA. No focal neuro deficits. No history features to suspect SAH. No labs findings to prompt consideration of emergent HD. Has mild hyperkalemia but no ectopy on monitor and patient with routine HD session in < 8 hours.   Reevaluation with update and discussion with patient she is feeling better. HA improving. Discussed labs and plan for HD in the AM. Discussed strict ED return precautions.    Disposition: discharge  ____________________________________________  FINAL CLINICAL IMPRESSION(S) / ED DIAGNOSES  Final diagnoses:  Acute non intractable tension-type headache  Acute cough    Note:  This document was prepared using Dragon voice recognition software and may include unintentional dictation errors.  Nanda Quinton, MD, [facility] Emergency Medicine    Laurynn Mccorvey, Wonda [facility], MD 09/27/22 [phone]

## 2022-09-21 NOTE — Discharge Instructions (Addendum)
You were seen in the emergency room today with headache and cough. Your lab work here is reassuring your COVID test is negative.  You do not show evidence of pneumonia on your chest x-ray.  Your lab work here is reassuring.  Please keep your dialysis appointment tomorrow and return with any new or suddenly worsening symptoms.

## 2022-09-29 ENCOUNTER — Other Ambulatory Visit: Payer: Self-pay | Admitting: *Deleted

## 2022-09-29 DIAGNOSIS — I5189 Other ill-defined heart diseases: Secondary | ICD-10-CM

## 2022-09-29 DIAGNOSIS — I058 Other rheumatic mitral valve diseases: Secondary | ICD-10-CM

## 2022-09-30 ENCOUNTER — Ambulatory Visit (HOSPITAL_COMMUNITY): Payer: Medicare Other | Admitting: Anesthesiology

## 2022-09-30 ENCOUNTER — Encounter (HOSPITAL_COMMUNITY)
Admission: RE | Disposition: A | Discharge status: Home / Self Care | Payer: Self-pay | Source: Home / Self Care | Attending: Internal Medicine

## 2022-09-30 ENCOUNTER — Inpatient Hospital Stay (HOSPITAL_COMMUNITY)
Admission: RE | Admit: 2022-09-30 | Discharge: 2022-10-05 | DRG: 673 | Disposition: A | Discharge status: Home / Self Care | Payer: Medicare Other | Attending: Internal Medicine | Admitting: Internal Medicine

## 2022-09-30 ENCOUNTER — Encounter (HOSPITAL_COMMUNITY): Payer: Self-pay | Admitting: Cardiology

## 2022-09-30 ENCOUNTER — Ambulatory Visit (HOSPITAL_COMMUNITY): Payer: Medicare Other

## 2022-09-30 DIAGNOSIS — Z8673 Personal history of transient ischemic attack (TIA), and cerebral infarction without residual deficits: Secondary | ICD-10-CM

## 2022-09-30 DIAGNOSIS — K3184 Gastroparesis: Secondary | ICD-10-CM | POA: Diagnosis present

## 2022-09-30 DIAGNOSIS — I5189 Other ill-defined heart diseases: Secondary | ICD-10-CM | POA: Diagnosis not present

## 2022-09-30 DIAGNOSIS — E11610 Type 2 diabetes mellitus with diabetic neuropathic arthropathy: Secondary | ICD-10-CM | POA: Diagnosis present

## 2022-09-30 DIAGNOSIS — Z885 Allergy status to narcotic agent status: Secondary | ICD-10-CM

## 2022-09-30 DIAGNOSIS — N186 End stage renal disease: Secondary | ICD-10-CM | POA: Diagnosis present

## 2022-09-30 DIAGNOSIS — J449 Chronic obstructive pulmonary disease, unspecified: Secondary | ICD-10-CM | POA: Diagnosis present

## 2022-09-30 DIAGNOSIS — K219 Gastro-esophageal reflux disease without esophagitis: Secondary | ICD-10-CM | POA: Diagnosis present

## 2022-09-30 DIAGNOSIS — I081 Rheumatic disorders of both mitral and tricuspid valves: Secondary | ICD-10-CM | POA: Diagnosis present

## 2022-09-30 DIAGNOSIS — Z993 Dependence on wheelchair: Secondary | ICD-10-CM

## 2022-09-30 DIAGNOSIS — E1143 Type 2 diabetes mellitus with diabetic autonomic (poly)neuropathy: Secondary | ICD-10-CM | POA: Diagnosis present

## 2022-09-30 DIAGNOSIS — D631 Anemia in chronic kidney disease: Secondary | ICD-10-CM | POA: Diagnosis present

## 2022-09-30 DIAGNOSIS — E669 Obesity, unspecified: Secondary | ICD-10-CM | POA: Diagnosis present

## 2022-09-30 DIAGNOSIS — I350 Nonrheumatic aortic (valve) stenosis: Secondary | ICD-10-CM | POA: Diagnosis not present

## 2022-09-30 DIAGNOSIS — Z888 Allergy status to other drugs, medicaments and biological substances status: Secondary | ICD-10-CM

## 2022-09-30 DIAGNOSIS — Z20822 Contact with and (suspected) exposure to covid-19: Secondary | ICD-10-CM | POA: Diagnosis present

## 2022-09-30 DIAGNOSIS — E785 Hyperlipidemia, unspecified: Secondary | ICD-10-CM | POA: Diagnosis present

## 2022-09-30 DIAGNOSIS — E1122 Type 2 diabetes mellitus with diabetic chronic kidney disease: Secondary | ICD-10-CM | POA: Diagnosis present

## 2022-09-30 DIAGNOSIS — J069 Acute upper respiratory infection, unspecified: Secondary | ICD-10-CM | POA: Diagnosis present

## 2022-09-30 DIAGNOSIS — Z992 Dependence on renal dialysis: Secondary | ICD-10-CM | POA: Diagnosis not present

## 2022-09-30 DIAGNOSIS — Z7901 Long term (current) use of anticoagulants: Secondary | ICD-10-CM

## 2022-09-30 DIAGNOSIS — I058 Other rheumatic mitral valve diseases: Secondary | ICD-10-CM | POA: Diagnosis not present

## 2022-09-30 DIAGNOSIS — I38 Endocarditis, valve unspecified: Principal | ICD-10-CM

## 2022-09-30 DIAGNOSIS — Z833 Family history of diabetes mellitus: Secondary | ICD-10-CM

## 2022-09-30 DIAGNOSIS — E1159 Type 2 diabetes mellitus with other circulatory complications: Secondary | ICD-10-CM | POA: Diagnosis present

## 2022-09-30 DIAGNOSIS — I2699 Other pulmonary embolism without acute cor pulmonale: Secondary | ICD-10-CM | POA: Diagnosis not present

## 2022-09-30 DIAGNOSIS — Z794 Long term (current) use of insulin: Secondary | ICD-10-CM | POA: Diagnosis not present

## 2022-09-30 DIAGNOSIS — N2581 Secondary hyperparathyroidism of renal origin: Secondary | ICD-10-CM | POA: Diagnosis present

## 2022-09-30 DIAGNOSIS — M797 Fibromyalgia: Secondary | ICD-10-CM | POA: Diagnosis present

## 2022-09-30 DIAGNOSIS — Z86711 Personal history of pulmonary embolism: Secondary | ICD-10-CM

## 2022-09-30 DIAGNOSIS — I339 Acute and subacute endocarditis, unspecified: Secondary | ICD-10-CM | POA: Diagnosis not present

## 2022-09-30 DIAGNOSIS — I120 Hypertensive chronic kidney disease with stage 5 chronic kidney disease or end stage renal disease: Secondary | ICD-10-CM | POA: Diagnosis present

## 2022-09-30 DIAGNOSIS — E1169 Type 2 diabetes mellitus with other specified complication: Secondary | ICD-10-CM | POA: Diagnosis present

## 2022-09-30 DIAGNOSIS — Z8249 Family history of ischemic heart disease and other diseases of the circulatory system: Secondary | ICD-10-CM

## 2022-09-30 DIAGNOSIS — Z89511 Acquired absence of right leg below knee: Secondary | ICD-10-CM | POA: Diagnosis not present

## 2022-09-30 DIAGNOSIS — K589 Irritable bowel syndrome without diarrhea: Secondary | ICD-10-CM | POA: Diagnosis present

## 2022-09-30 DIAGNOSIS — Z91041 Radiographic dye allergy status: Secondary | ICD-10-CM

## 2022-09-30 DIAGNOSIS — Z6834 Body mass index (BMI) 34.0-34.9, adult: Secondary | ICD-10-CM

## 2022-09-30 DIAGNOSIS — I152 Hypertension secondary to endocrine disorders: Secondary | ICD-10-CM | POA: Diagnosis not present

## 2022-09-30 DIAGNOSIS — I330 Acute and subacute infective endocarditis: Secondary | ICD-10-CM | POA: Diagnosis present

## 2022-09-30 DIAGNOSIS — E119 Type 2 diabetes mellitus without complications: Secondary | ICD-10-CM

## 2022-09-30 DIAGNOSIS — Z79899 Other long term (current) drug therapy: Secondary | ICD-10-CM

## 2022-09-30 DIAGNOSIS — I3139 Other pericardial effusion (noninflammatory): Secondary | ICD-10-CM | POA: Diagnosis present

## 2022-09-30 DIAGNOSIS — Z86718 Personal history of other venous thrombosis and embolism: Secondary | ICD-10-CM

## 2022-09-30 HISTORY — PX: TEE WITHOUT CARDIOVERSION: SHX5443

## 2022-09-30 LAB — CBC
HCT: 41.2 % (ref 36.0–46.0)
Hemoglobin: 12.8 g/dL (ref 12.0–15.0)
MCH: 31.4 pg (ref 26.0–34.0)
MCHC: 31.1 g/dL (ref 30.0–36.0)
MCV: 101.2 fL — ABNORMAL HIGH (ref 80.0–100.0)
Platelets: 134 10*3/uL — ABNORMAL LOW (ref 150–400)
RBC: 4.07 MIL/uL (ref 3.87–5.11)
RDW: 16.6 % — ABNORMAL HIGH (ref 11.5–15.5)
WBC: 9.4 10*3/uL (ref 4.0–10.5)
nRBC: 0 % (ref 0.0–0.2)

## 2022-09-30 LAB — COMPREHENSIVE METABOLIC PANEL
ALT: 12 U/L (ref 0–44)
AST: 15 U/L (ref 15–41)
Albumin: 3.4 g/dL — ABNORMAL LOW (ref 3.5–5.0)
Alkaline Phosphatase: 98 U/L (ref 38–126)
Anion gap: 16 — ABNORMAL HIGH (ref 5–15)
BUN: 29 mg/dL — ABNORMAL HIGH (ref 6–20)
CO2: 27 mmol/L (ref 22–32)
Calcium: 9.6 mg/dL (ref 8.9–10.3)
Chloride: 96 mmol/L — ABNORMAL LOW (ref 98–111)
Creatinine, Ser: 8.92 mg/dL — ABNORMAL HIGH (ref 0.44–1.00)
GFR, Estimated: 5 mL/min — ABNORMAL LOW (ref >60)
Glucose, Bld: 244 mg/dL — ABNORMAL HIGH (ref 70–99)
Potassium: 3.3 mmol/L — ABNORMAL LOW (ref 3.5–5.1)
Sodium: 139 mmol/L (ref 135–145)
Total Bilirubin: 0.7 mg/dL (ref 0.3–1.2)
Total Protein: 8.1 g/dL (ref 6.5–8.1)

## 2022-09-30 LAB — HEMOGLOBIN A1C
Hgb A1c MFr Bld: 7.6 % — ABNORMAL HIGH (ref 4.8–5.6)
Mean Plasma Glucose: 171.42 mg/dL

## 2022-09-30 LAB — C-REACTIVE PROTEIN: CRP: 6.8 mg/dL — ABNORMAL HIGH (ref <1.0)

## 2022-09-30 LAB — POCT I-STAT, CHEM 8
BUN: 28 mg/dL — ABNORMAL HIGH (ref 6–20)
Calcium, Ion: 1.19 mmol/L (ref 1.15–1.40)
Chloride: 97 mmol/L — ABNORMAL LOW (ref 98–111)
Creatinine, Ser: 8 mg/dL — ABNORMAL HIGH (ref 0.44–1.00)
Glucose, Bld: 154 mg/dL — ABNORMAL HIGH (ref 70–99)
HCT: 47 % — ABNORMAL HIGH (ref 36.0–46.0)
Hemoglobin: 16 g/dL — ABNORMAL HIGH (ref 12.0–15.0)
Potassium: 3.9 mmol/L (ref 3.5–5.1)
Sodium: 142 mmol/L (ref 135–145)
TCO2: 33 mmol/L — ABNORMAL HIGH (ref 22–32)

## 2022-09-30 LAB — GLUCOSE, CAPILLARY
Glucose-Capillary: 214 mg/dL — ABNORMAL HIGH (ref 70–99)
Glucose-Capillary: 249 mg/dL — ABNORMAL HIGH (ref 70–99)

## 2022-09-30 LAB — HEPATITIS B SURFACE ANTIGEN: Hepatitis B Surface Ag: NONREACTIVE

## 2022-09-30 LAB — ECHO TEE
MV M vel: 4.73 m/s
MV Peak grad: 89.5 mmHg

## 2022-09-30 LAB — SEDIMENTATION RATE: Sed Rate: 52 mm/hr — ABNORMAL HIGH (ref 0–22)

## 2022-09-30 LAB — CK: Total CK: 47 U/L (ref 38–234)

## 2022-09-30 SURGERY — ECHOCARDIOGRAM, TRANSESOPHAGEAL
Anesthesia: Monitor Anesthesia Care

## 2022-09-30 MED ORDER — DOXERCALCIFEROL 4 MCG/2ML IV SOLN
3.0000 ug | INTRAVENOUS | Status: DC
  Administered 2022-10-01 – 2022-10-04 (×2): 3 ug via INTRAVENOUS
  Filled 2022-09-30 (×4): qty 2

## 2022-09-30 MED ORDER — BENZONATATE 100 MG PO CAPS
100.0000 mg | ORAL_CAPSULE | Freq: Three times a day (TID) | ORAL | Status: DC | PRN
  Administered 2022-09-30 – 2022-10-05 (×2): 100 mg via ORAL
  Filled 2022-09-30 (×2): qty 1

## 2022-09-30 MED ORDER — LABETALOL HCL 5 MG/ML IV SOLN
10.0000 mg | INTRAVENOUS | Status: DC | PRN
  Administered 2022-09-30: 10 mg via INTRAVENOUS

## 2022-09-30 MED ORDER — VANCOMYCIN HCL 2000 MG/400ML IV SOLN
2000.0000 mg | Freq: Once | INTRAVENOUS | Status: DC
  Filled 2022-09-30: qty 400

## 2022-09-30 MED ORDER — SODIUM CHLORIDE 0.9 % IV SOLN
8.0000 mg/kg | INTRAVENOUS | Status: DC
  Administered 2022-10-01 – 2022-10-02 (×2): 650 mg via INTRAVENOUS
  Filled 2022-09-30 (×4): qty 13

## 2022-09-30 MED ORDER — ONDANSETRON HCL 4 MG/2ML IJ SOLN
4.0000 mg | Freq: Four times a day (QID) | INTRAMUSCULAR | Status: DC | PRN
  Administered 2022-09-30: 4 mg via INTRAVENOUS
  Filled 2022-09-30: qty 2

## 2022-09-30 MED ORDER — CALCIUM ACETATE (PHOS BINDER) 667 MG PO CAPS
2001.0000 mg | ORAL_CAPSULE | Freq: Three times a day (TID) | ORAL | Status: DC
  Administered 2022-10-01 – 2022-10-04 (×6): 2001 mg via ORAL
  Filled 2022-09-30 (×9): qty 3

## 2022-09-30 MED ORDER — INSULIN ASPART 100 UNIT/ML IJ SOLN
0.0000 [IU] | Freq: Three times a day (TID) | INTRAMUSCULAR | Status: DC
  Administered 2022-10-03: 1 [IU] via SUBCUTANEOUS

## 2022-09-30 MED ORDER — AMLODIPINE BESYLATE 5 MG PO TABS
5.0000 mg | ORAL_TABLET | Freq: Every day | ORAL | Status: DC
  Administered 2022-10-02 – 2022-10-05 (×3): 5 mg via ORAL
  Filled 2022-09-30 (×5): qty 1

## 2022-09-30 MED ORDER — VANCOMYCIN HCL 500 MG/100ML IV SOLN
500.0000 mg | Freq: Once | INTRAVENOUS | Status: DC
  Filled 2022-09-30: qty 100

## 2022-09-30 MED ORDER — APIXABAN 5 MG PO TABS: 5.0000 mg | ORAL_TABLET | Freq: Every day | ORAL | Status: DC

## 2022-09-30 MED ORDER — GUAIFENESIN ER 600 MG PO TB12
1200.0000 mg | ORAL_TABLET | Freq: Two times a day (BID) | ORAL | Status: DC
  Administered 2022-09-30 – 2022-10-05 (×7): 1200 mg via ORAL
  Filled 2022-09-30 (×9): qty 2

## 2022-09-30 MED ORDER — APIXABAN 5 MG PO TABS
5.0000 mg | ORAL_TABLET | Freq: Two times a day (BID) | ORAL | Status: DC
  Administered 2022-09-30 – 2022-10-03 (×6): 5 mg via ORAL
  Filled 2022-09-30 (×7): qty 1

## 2022-09-30 MED ORDER — IRBESARTAN 300 MG PO TABS
150.0000 mg | ORAL_TABLET | Freq: Every day | ORAL | Status: DC
  Administered 2022-10-02 – 2022-10-05 (×3): 150 mg via ORAL
  Filled 2022-09-30 (×4): qty 1

## 2022-09-30 MED ORDER — LIDOCAINE 2% (20 MG/ML) 5 ML SYRINGE
INTRAMUSCULAR | Status: DC | PRN
  Administered 2022-09-30: 60 mg via INTRAVENOUS

## 2022-09-30 MED ORDER — PROPOFOL 10 MG/ML IV BOLUS
INTRAVENOUS | Status: DC | PRN
  Administered 2022-09-30 (×3): 20 mg via INTRAVENOUS
  Administered 2022-09-30: 50 mg via INTRAVENOUS
  Administered 2022-09-30: 20 mg via INTRAVENOUS

## 2022-09-30 MED ORDER — SODIUM CHLORIDE 0.9 % IV SOLN: INTRAVENOUS | Status: DC

## 2022-09-30 MED ORDER — GABAPENTIN 300 MG PO CAPS
300.0000 mg | ORAL_CAPSULE | ORAL | Status: DC
  Administered 2022-10-04: 300 mg via ORAL
  Filled 2022-09-30 (×4): qty 1

## 2022-09-30 MED ORDER — VANCOMYCIN VARIABLE DOSE PER UNSTABLE RENAL FUNCTION (PHARMACIST DOSING): Status: DC

## 2022-09-30 MED ORDER — SODIUM CHLORIDE 0.9 % IV SOLN
1.0000 g | INTRAVENOUS | Status: DC
  Administered 2022-10-01 – 2022-10-05 (×4): 1 g via INTRAVENOUS
  Filled 2022-09-30 (×5): qty 10

## 2022-09-30 MED ORDER — LABETALOL HCL 5 MG/ML IV SOLN
INTRAVENOUS | Status: AC
  Filled 2022-09-30: qty 4

## 2022-09-30 MED ORDER — INSULIN GLARGINE 100 UNIT/ML ~~LOC~~ SOLN
26.0000 [IU] | Freq: Every day | SUBCUTANEOUS | Status: DC
  Administered 2022-10-01 – 2022-10-03 (×4): 26 [IU] via SUBCUTANEOUS
  Filled 2022-09-30 (×6): qty 0.26

## 2022-09-30 MED ORDER — SODIUM CHLORIDE 0.9 % IV SOLN
2.0000 g | Freq: Once | INTRAVENOUS | Status: AC
  Administered 2022-09-30: 2 g via INTRAVENOUS
  Filled 2022-09-30: qty 12.5

## 2022-09-30 MED ORDER — CHLORHEXIDINE GLUCONATE CLOTH 2 % EX PADS
6.0000 | MEDICATED_PAD | Freq: Every day | CUTANEOUS | Status: DC
  Administered 2022-10-01 – 2022-10-04 (×2): 6 via TOPICAL

## 2022-09-30 MED ORDER — PROPOFOL 500 MG/50ML IV EMUL
INTRAVENOUS | Status: DC | PRN
  Administered 2022-09-30: 100 ug/kg/min via INTRAVENOUS

## 2022-09-30 NOTE — Progress Notes (Signed)
Inpatient disease consultation appreciated.  Discussed with on-call nephrology Dr. Candiss Norse, regarding infection disease recommendation of HD catheter removal and culture.  Given the patient does not need a emergency dialysis tonight, nephrology will see the patient tomorrow morning and arrange HD via the current HD catheter, then probably arrange for line removal by IR.  But given the patient has had history of poor access in the past, likely will need a line holiday over the weekend and IR to reinsert a new HD catheter sometime Monday.

## 2022-09-30 NOTE — Progress Notes (Signed)
Pt reports hx of violent N/V with vancomycin. Appreciate pharmacy recommendations, plan to use daptomycin instead.

## 2022-09-30 NOTE — Anesthesia Preprocedure Evaluation (Addendum)
Anesthesia Evaluation  Patient identified by MRN, date of birth, ID band Patient awake    Reviewed: Allergy & Precautions, NPO status , Patient's Chart, lab work & pertinent test results  History of Anesthesia Complications Negative for: history of anesthetic complications  Airway Mallampati: I  TM Distance: >3 FB Neck ROM: Full    Dental  (+) Dental Advisory Given, Teeth Intact   Pulmonary PE   Pulmonary exam normal        Cardiovascular hypertension, Pt. on medications Normal cardiovascular exam   '23 TTE - small mobile mass attached to the mitral valve (0.9 x 0.6 cm), possibly attached to the calcified posterior MV annulus. The lesion is mobile and calcified. EF 65 to 70%. There is moderate concentric left ventricular hypertrophy. Indeterminate diastolic filling due to E-A fusion. A small pericardial effusion is present. The pericardial effusion is posterior to the left ventricle.     Neuro/Psych  Headaches, PSYCHIATRIC DISORDERS Depression CVA, No Residual Symptoms    GI/Hepatic Neg liver ROS, GERD  Controlled, IBS Gastroparesis    Endo/Other  diabetes, Type 2, Insulin Dependent Obesity   Renal/GU ESRF and DialysisRenal disease     Musculoskeletal  (+) Fibromyalgia -  Abdominal   Peds  Hematology negative hematology ROS (+)   Anesthesia Other Findings   Reproductive/Obstetrics  Leiomyoma                             Anesthesia Physical Anesthesia Plan  ASA: 3  Anesthesia Plan: MAC   Post-op Pain Management: Minimal or no pain anticipated   Induction:   PONV Risk Score and Plan: 2 and Propofol infusion and Treatment may vary due to age or medical condition  Airway Management Planned: Nasal Cannula and Natural Airway  Additional Equipment: None  Intra-op Plan:   Post-operative Plan:   Informed Consent: I have reviewed the patients History and Physical, chart, labs  and discussed the procedure including the risks, benefits and alternatives for the proposed anesthesia with the patient or authorized representative who has indicated his/her understanding and acceptance.       Plan Discussed with: CRNA and Anesthesiologist  Anesthesia Plan Comments:        Anesthesia Quick Evaluation

## 2022-09-30 NOTE — H&P (Addendum)
History and Physical    BIRTTANY DECHELLIS MBW:[id number] DOB: 1971/04/13 DOA: 09/30/2022  PCP: [name], MD (Confirm with patient/family/NH records and if not entered, this has to be entered at [facility] point of entry) Patient coming from: Home  I have personally briefly reviewed patient's old medical records in [place]  Chief Complaint: Feeling ok  HPI: CARLOTA PHILLEY is a 51 y.o. 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  Patient has had chronic dry cough for about 1 year, has had multiple rounds of OTC medications as well as antibiotics with no significant improvement.  She also went to see pulmonology recently and had a normal lung function test.  [name] a non-smoker and husband only smokes outside of the house.  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  In May, echocardiogram found she had a small " clot" sitting on the tip of the HD catheter in the right atrium, for which she was evaluated by CT surgery and recommended conservative management with continuing Eliquis but no surgical intervention.  She remained asymptomatic and on September 19, repeat TTE showed stable right atrium HD catheter tip clot but new suspected vegetation like structure on the left posterior mitral valve measuring at 0.9 x0.6 centimeter, and patient was scheduled for TEE today.  On today's TEE, finding concerning for posterior mitral valve vegetation sizing at 2.4 x 0.4 cm which raised suspicion for endocarditis.  Patient reported cough has been chronic and last week, patient started to have coughing up of light yellowish sputum, denies any fever chills no joint pain no weight loss.   Review of  Systems: As per HPI otherwise 14 point review of systems negative.    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[place]), nonhemorrhagic, inferior right cerebellum 12/03/2017   left side weakness in leg   Cyst of left ovary 01/12/2018   Diabetic neuropathy, painful ([place]), on low dose Gabapentin 07/13/2013   DM (diabetes mellitus), type 2, uncontrolled, with renal complications 01/77/9390   DM2 (diabetes mellitus, type 2) ([place])    Dysfunction of left eustachian tube, with pusatile tinnitus 11/24/2017   Dyslipidemia associated with type 2 diabetes mellitus ([place]) 06/25/2016   ESRD (end stage renal disease) on dialysis [facility])    On hemodialysis in July 2019 via [facility] then switched to [facility] in Nov 2019.    ESRD with anemia ([facility])    Fibromyalgia    Gastroparesis due to DM    GERD (gastroesophageal reflux disease)    Headache    Hypertension associated with diabetes ([place]) 02/19/2011   IBS (irritable bowel syndrome)    Left-sided weakness 12/10/2017   .   Leiomyoma of uterus    Pancreatitis    PE (pulmonary thromboembolism) ([place]) 11/04/2019   Pneumonia    Proliferative diabetic retinopathy ([place]) 09/29/2015   Pronation deformity of both feet 06/14/2014   Reactive depression 12/10/2017   .   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[place]) 02/17/2018  Thromboembolism ([place]) 04/05/2018   Upper airway cough syndrome, with recs to stay off ACE and take Pepcid q hs 11/15/2016    Past Surgical History:  Procedure Laterality Date   A/V FISTULAGRAM Right 03/03/2020   Procedure: Venogram;  Surgeon: Waynetta Sandy, MD;  Location: [place] [facility];  Service: Cardiovascular;  Laterality: Right;   ARTERY REPAIR  Right 02/17/2018   Procedure: EXPLORATION OF RIGHT BRACHIAL ARTERY;  Surgeon: Conrad Noble, MD;  Location: [place];  Service: Vascular;  Laterality: Right;   ARTERY REPAIR Right 02/17/2018   Procedure: BRACHIAL ARTERY EXPLORATION AND TRHOMBECTOMY;  Surgeon: Conrad Devine, MD;  Location: [place];  Service: Vascular;  Laterality: Right;   AV FISTULA PLACEMENT Left 05/09/2017   Procedure: INSERTION OF ARTERIOVENOUS (AV) GRAFT ARM (ARTEGRAFT);  Surgeon: Conrad [place], MD;  Location: [place];  Service: Vascular;  Laterality: Left;   AV FISTULA PLACEMENT Right 02/17/2018   Procedure: INSERTION OF ARTERIOVENOUS (AV) GORE-TEX GRAFT ARM RIGHT UPPER ARM;  Surgeon: Conrad [place], MD;  Location: [place];  Service: Vascular;  Laterality: Right;   AV FISTULA PLACEMENT Left 10/10/2019   Procedure: INSERTION OF LEFT ARTERIOVENOUS (AV) ARTEGRAFT GRAFT ARM;  Surgeon: Waynetta Sandy, MD;  Location: [place];  Service: Vascular;  Laterality: Left;   AV FISTULA PLACEMENT Right 03/19/2020   Procedure: INSERTION OF ARTERIOVENOUS (AV) GORE-TEX GRAFT IN RIGHT ARM AND VIABAHN STENT IN RIGHT AXILLARY ARTERY;  Surgeon: Waynetta Sandy, MD;  Location: [place];  Service: Vascular;  Laterality: Right;   [place] Left 08/31/2016   Procedure: BASILIC VEIN TRANSPOSITION FIRST STAGE;  Surgeon: Conrad [place], MD;  Location: [place];  Service: Vascular;  Laterality: Left;   BRONCHIAL WASHINGS  09/17/2021   Procedure: BRONCHIAL WASHINGS;  Surgeon: Collene Gobble, MD;  [facility];  Service: Cardiopulmonary;;   CENTRAL VENOUS CATHETER INSERTION Left 07/24/2018   Procedure: INSERTION CENTRAL LINE ADULT;  Surgeon: Conrad [place], MD;  Location: [place];  Service: Vascular;  Laterality: Left;   EYE SURGERY     secondary to diabetic retinopathy    FOOT SURGERY Right    t"ook bone out- maybe hammer toe"   INSERTION OF DIALYSIS CATHETER Left 07/24/2018   Procedure: INSERTION OF TUNNELED DIALYSIS CATHETER;   Surgeon: Conrad [place], MD;  Location: [place];  Service: Vascular;  Laterality: Left;   IR FLUORO GUIDE CV LINE LEFT  12/19/2021   IR FLUORO GUIDE CV LINE LEFT  12/29/2021   IR FLUORO GUIDE CV LINE RIGHT  07/21/2018   IR FLUORO GUIDE CV LINE RIGHT  06/01/2019   IR FLUORO GUIDE CV LINE RIGHT  05/05/2020   IR REMOVAL TUN CV CATH W/O FL  01/23/2021   IR REMOVAL TUN CV CATH W/O FL  12/19/2021   IR US GUIDE VASC ACCESS LEFT  12/19/2021   IR US GUIDE VASC ACCESS RIGHT  07/21/2018   IR US GUIDE VASC ACCESS RIGHT  06/01/2019   LIGATION ARTERIOVENOUS GORTEX GRAFT Right 03/20/2020   Procedure: LIGATION ARM ARTERIOVENOUS GORTEX GRAFT With Patch Angioplasty, Thrombectomy;  Surgeon: Waynetta Sandy, MD;  Location: [place];  Service: Vascular;  Laterality: Right;   LIGATION OF ARTERIOVENOUS  FISTULA Left 05/09/2017   Procedure: LIGATION OF ARTERIOVENOUS  FISTULA;  Surgeon: Conrad [place], MD;  Location: [place];  Service: Vascular;  Laterality: Left;   LOOP RECORDER INSERTION N/A 12/05/2017   Procedure: LOOP RECORDER INSERTION;  Surgeon: Evans Lance,  MD;  Location: [place] [facility];  Service: Cardiovascular;  Laterality: N/A;   MYOMECTOMY     REMOVAL OF GRAFT Right 02/17/2018   Procedure: REMOVAL OF RIGHT UPPER ARM ARTERIOVENOUS GRAFT;  Surgeon: Conrad [place], MD;  Location: [place];  Service: Vascular;  Laterality: Right;   REVISION OF ARTERIOVENOUS GORETEX GRAFT Left 07/24/2018   Procedure: REDO ARTERIOVENOUS GORETEX GRAFT;  Surgeon: Conrad [place], MD;  Location: [place];  Service: Vascular;  Laterality: Left;   TEE WITHOUT CARDIOVERSION N/A 12/05/2017   Procedure: TRANSESOPHAGEAL ECHOCARDIOGRAM (TEE);  Surgeon: Sanda Klein, MD;  Location: [place];  Service: Cardiovascular;  Laterality: N/A;   UPPER EXTREMITY VENOGRAPHY Left 07/13/2018   Procedure: UPPER EXTREMITY VENOGRAPHY - Central & Left Arm;  Surgeon: Conrad Hoffman, MD;  Location: [place] [facility];  Service: Cardiovascular;  Laterality: Left;    UPPER EXTREMITY VENOGRAPHY Bilateral 09/10/2019   Procedure: UPPER EXTREMITY VENOGRAPHY;  Surgeon: Waynetta Sandy, MD;  Location: [place] [facility];  Service: Cardiovascular;  Laterality: Bilateral;   UTERINE FIBROID SURGERY     VIDEO BRONCHOSCOPY N/A 09/17/2021   Procedure: VIDEO BRONCHOSCOPY WITHOUT FLUORO;  Surgeon: Collene Gobble, MD;  [facility];  Service: Cardiopulmonary;  Laterality: N/A;   VITRECTOMY Bilateral      reports that she has never smoked. She has never used smokeless tobacco. She reports that she does not drink alcohol and does not use drugs.  Allergies  Allergen Reactions   Ibuprofen Other (See Comments)    CKD stage 3. Should avoid.   Dilaudid [Hydromorphone Hcl] Itching and Other (See Comments)    Can take with Benadryl.   Iodinated Contrast Media Rash   Tramadol Itching    Family History  Problem Relation Age of Onset   Colon polyps Mother    Diabetes Mother    Heart murmur Father        history essentially unknown   Hypertension Father    Diabetes Sister    Kidney failure Sister        kidney transplant   Diabetes Brother    Retinal degeneration Brother    Heart murmur Son    Stroke Paternal Grandmother    Diabetes Sister     Prior to Admission medications   Medication Sig Start Date End Date Taking? Authorizing Provider  amLODipine-olmesartan (AZOR) 5-20 MG tablet TAKE 1 TABLET BY MOUTH EVERY DAY 09/09/22  Yes [name], MD  calcium acetate (PHOSLO) 667 MG capsule Take 2,001 mg by mouth 3 (three) times daily with meals. 08/25/21  Yes [provider]  Continuous Blood Gluc Sensor (FREESTYLE LIBRE 14 DAY SENSOR) MISC 1 patch by Does not apply route every 14 (fourteen) days. 02/25/22  Yes [name], MD  Doxercalciferol (HECTOROL IV) Doxercalciferol (Hectorol) 05/07/22 05/06/23 Yes [provider]  gabapentin (NEURONTIN) 300 MG capsule TAKE 1 CAPSULE (300 MG TOTAL) BY MOUTH DAILY AS NEEDED. TAKE AFTER  DIALYSIS. DO NOT TAKE MORE THAN THREE TIMES WEEKLY. Patient taking differently: Take 300 mg by mouth every Monday, Wednesday, and Friday. At 10:30 am 07/06/22  Yes [name], MD  insulin aspart (NOVOLOG FLEXPEN) 100 UNIT/ML FlexPen Inject 6 Units into the skin 3 (three) times daily with meals. Sliding scale 02/25/22  Yes [name], MD  insulin degludec (TRESIBA FLEXTOUCH) 100 UNIT/ML FlexTouch Pen Inject 26 Units into the skin at bedtime. 02/25/22  Yes [name], MD  ketoconazole (NIZORAL) 2 % cream Apply 1  Application topically daily. 09/22/22  Yes [provider]  Methoxy PEG-Epoetin Beta (MIRCERA IJ) Mircera 03/03/22 03/02/23 Yes [provider]  clindamycin (CLEOCIN T) 1 % SWAB Apply 1 Application topically. 09/22/22   [provider]  ELIQUIS 5 MG TABS tablet Take 5 mg by mouth daily. 01/31/22   [provider]  fluticasone (FLONASE) 50 MCG/ACT nasal spray Place 2 sprays into both nostrils daily. Patient not taking: Reported on 09/23/2022 07/02/22   Clayton Bibles, NP  Insulin Pen Needle 29G X 5MM MISC 1 Device by Does not apply route 4 (four) times daily. 12/26/19   Shamleffer, Melanie Crazier, MD    Physical Exam: Vitals:   09/30/22 0958 09/30/22 1216 09/30/22 1226 09/30/22 1236  BP: (!) 196/97 (!) 92/52 (!) 109/57 114/66  Pulse: 88 78 78 80  Resp: (!) '21 16 14 20  ' Temp: 97.6 F (36.4 C) (!) 97.5 F (36.4 C)    TempSrc: Tympanic Tympanic    SpO2: 95% 95% 93% 95%  Weight: 107 kg     Height: '5\' 9"'  (1.753 m)       Constitutional: NAD, calm, comfortable Vitals:   09/30/22 0958 09/30/22 1216 09/30/22 1226 09/30/22 1236  BP: (!) 196/97 (!) 92/52 (!) 109/57 114/66  Pulse: 88 78 78 80  Resp: (!) '21 16 14 20  ' Temp: 97.6 F (36.4 C) (!) 97.5 F (36.4 C)    TempSrc: Tympanic Tympanic    SpO2: 95% 95% 93% 95%  Weight: 107 kg     Height: '5\' 9"'  (1.753 m)      Eyes: PERRL, lids and conjunctivae normal ENMT: Mucous membranes are moist.  Posterior pharynx clear of any exudate or lesions.Normal dentition.  Neck: normal, supple, no masses, no thyromegaly Respiratory: clear to auscultation bilaterally, no wheezing, no crackles. Normal respiratory effort. No accessory muscle use.  Cardiovascular: Regular rate and rhythm, no murmurs / rubs / gallops. No extremity edema. 2+ pedal pulses. No carotid bruits.  Abdomen: no tenderness, no masses palpated. No hepatosplenomegaly. Bowel sounds positive.  Musculoskeletal: no clubbing / cyanosis. No joint deformity upper and lower extremities. Good ROM, no contractures. Normal muscle tone.  Skin: no rashes, lesions, ulcers. No induration Neurologic: CN 2-12 grossly intact. Sensation intact, DTR normal. Strength 5/5 in all 4.  Psychiatric: Normal judgment and insight. Alert and oriented x 3. Normal mood.     Labs on Admission: I have personally reviewed following labs and imaging studies  CBC: Recent Labs  Lab 09/30/22 1110  HGB 16.0*  HCT 15.5*   Basic Metabolic Panel: Recent Labs  Lab 09/30/22 1110  NA 142  K 3.9  CL 97*  GLUCOSE 154*  BUN 28*  CREATININE 8.00*   GFR: Estimated Creatinine Clearance: 10.7 mL/min (A) (by C-G formula based on SCr of 8 mg/dL (H)). Liver Function Tests: No results for input(s): "AST", "ALT", "ALKPHOS", "BILITOT", "PROT", "ALBUMIN" in the last 168 hours. No results for input(s): "LIPASE", "AMYLASE" in the last 168 hours. No results for input(s): "AMMONIA" in the last 168 hours. Coagulation Profile: No results for input(s): "INR", "PROTIME" in the last 168 hours. Cardiac Enzymes: No results for input(s): "CKTOTAL", "CKMB", "CKMBINDEX", "TROPONINI" in the last 168 hours. BNP (last 3 results) No results for input(s): "PROBNP" in the last 8760 hours. HbA1C: No results for input(s): "HGBA1C" in the last 72 hours. CBG: No results for input(s): "GLUCAP" in the last 168 hours. Lipid Profile: No results for input(s): "CHOL", "HDL", "LDLCALC",  "TRIG", "CHOLHDL", "LDLDIRECT" in  the last 72 hours. Thyroid Function Tests: No results for input(s): "TSH", "T4TOTAL", "FREET4", "T3FREE", "THYROIDAB" in the last 72 hours. Anemia Panel: No results for input(s): "VITAMINB12", "FOLATE", "FERRITIN", "TIBC", "IRON", "RETICCTPCT" in the last 72 hours. Urine analysis:    Component Value Date/Time   COLORURINE YELLOW 10/10/2019 0754   APPEARANCEUR TURBID (A) 10/10/2019 0754   LABSPEC 1.014 10/10/2019 0754   PHURINE 5.0 10/10/2019 0754   GLUCOSEU NEGATIVE 10/10/2019 0754   HGBUR MODERATE (A) 10/10/2019 0754   BILIRUBINUR Positive 02/01/2020 1507   BILIRUBINUR neg 08/08/2013 0000   KETONESUR NEGATIVE 10/10/2019 0754   PROTEINUR Positive (A) 02/01/2020 1507   PROTEINUR >=300 (A) 10/10/2019 0754   UROBILINOGEN 0.2 02/01/2020 1507   UROBILINOGEN 0.2 09/07/2015 1230   NITRITE Negative 02/01/2020 1507   NITRITE NEGATIVE 10/10/2019 0754   LEUKOCYTESUR Trace (A) 02/01/2020 1507   LEUKOCYTESUR LARGE (A) 10/10/2019 0754    Radiological Exams on Admission: No results found.  EKG: Ordered  Assessment/Plan Principal Problem:   Endocarditis  (please populate well all problems here in Problem List. (For example, if patient is on BP meds at home and you resume or decide to hold them, it is a problem that needs to be her. Same for CAD, COPD, HLD and so on)  Endocarditis, rule out -New left posterior mitral valve vegetation concerning for infectious endocarditis.  Discussed with infection disease attending Dr. Gale Journey, recommend blood culture for now no indication for antibiotics at this point. -Other DDx, given the presentation rather asymptomatic for typical endocarditis, will check ANA, CRP and ESR. -CT surgery also consulted.  Right HD catheter tip thrombosis -Not clear whether there is chronic thrombosis has any relation with the mitral valve vegetation this point. -CT surgery consulted -Continue Eliquis for now -At this point, there is no  contraindication to use the HD catheter for routine dialysis as the nature of the HD catheter tip thrombosis less likely infectious prior to the posterior mitral valve vegetation.  Chronic cough -No clear etiology.  Symptomatic management. -Asthma ruled out by outpatient lung function test  History of DVT and PE -Appears to be unprovoked, check lupus anticoagulant  ESRD on HD -Euvolemic, last dialysis Wednesday.  Next session tomorrow, call nephrology tomorrow morning for routine dialysis.  IDDM -Continue home dosage of Lantus 26 units daily -Sliding scale  DVT prophylaxis: Eliquis Code Status: Full code Family Communication: Husband at bedside Disposition Plan: Patient sick with newly found mitral valve vegetation suspicious for infectious endocarditis, requiring inpatient cardiology/CT surgery evaluation, expect more than 2 midnight hospital stay Consults called: Infectious disease, CT surgery Admission status: Telemetry admission   Lequita Halt MD [facility] Pager [phone]  09/30/2022, 1:55 PM

## 2022-09-30 NOTE — Progress Notes (Signed)
  Echocardiogram 2D Echocardiogram has been performed.  Heather [name] 09/30/2022, 12:11 PM

## 2022-09-30 NOTE — Discharge Instructions (Signed)
TEE  YOU HAD AN CARDIAC PROCEDURE TODAY: Refer to the procedure report and other information in the discharge instructions given to you for any specific questions about what was found during the examination. If this information does not answer your questions, please call [facility] office at [phone] to clarify.   DIET: Your first meal following the procedure should be a light meal and then it is ok to progress to your normal diet. A half-sandwich or bowl of soup is an example of a good first meal. Heavy or fried foods are harder to digest and may make you feel nauseous or bloated. Drink plenty of fluids but you should avoid alcoholic beverages for 24 hours. If you had a esophageal dilation, please see attached instructions for diet.   ACTIVITY: Your care partner should take you home directly after the procedure. You should plan to take it easy, moving slowly for the rest of the day. You can resume normal activity the day after the procedure however YOU SHOULD NOT DRIVE, use power tools, machinery or perform tasks that involve climbing or major physical exertion for 24 hours (because of the sedation medicines used during the test).   SYMPTOMS TO REPORT IMMEDIATELY: A cardiologist can be reached at any hour. Please call [phone] for any of the following symptoms:  Vomiting of blood or coffee ground material  New, significant abdominal pain  New, significant chest pain or pain under the shoulder blades  Painful or persistently difficult swallowing  New shortness of breath  Black, tarry-looking or red, bloody stools  FOLLOW UP:  Please also call with any specific questions about appointments or follow up tests.   

## 2022-09-30 NOTE — Interval H&P Note (Signed)
History and Physical Interval Note:  09/30/2022 10:25 AM  Heather [name]  has presented today for surgery, with the diagnosis of RIGHT ATRIAL MASS,MITRAL VALVE MASS.  The various methods of treatment have been discussed with the patient and family. After consideration of risks, benefits and other options for treatment, the patient has consented to  Procedure(s): TRANSESOPHAGEAL ECHOCARDIOGRAM (TEE) (N/A) as a surgical intervention.  The patient's history has been reviewed, patient examined, no change in status, stable for surgery.  I have reviewed the patient's chart and labs.  Questions were answered to the patient's satisfaction.     [employer]

## 2022-09-30 NOTE — Anesthesia Procedure Notes (Signed)
Procedure Name: MAC Date/Time: 09/30/2022 11:32 AM  Performed by: Lorie Phenix, CRNAPre-anesthesia Checklist: Patient identified, Emergency Drugs available, Suction available, Patient being monitored and Timeout performed Patient Re-evaluated:Patient Re-evaluated prior to induction Oxygen Delivery Method: Nasal cannula Placement Confirmation: positive ETCO2

## 2022-09-30 NOTE — CV Procedure (Signed)
   Transesophageal Echocardiogram  Indications: Mitral valve mass  Time out performed  During this procedure the patient was administered propofol under anesthesiology supervision to achieve and maintain moderate sedation.  The patient's heart rate, blood pressure, and oxygen saturation are monitored continuously during the procedure.   Findings:  Left Ventricle: Normal EF 60%  Mitral Valve: Large mitral valve vegetation, 2.5 cm in length, 0.4 cm in width highly mobile attached to the mitral valve annulus along the posterior leaflet of the mitral valve.  Aortic Valve: Trileaflet, normal  Tricuspid Valve: Mild TR  Left Atrium: Normal, no left atrial appendage thrombus  Right Atrium: Dialysis catheter noted, thrombus like material on tip.  Intraatrial septum: No PFO on color flow Doppler  Bubble Contrast Study: Not performed   Impressions - We will go ahead and admit.  She needs infectious disease consult, cardiothoracic surgery consult.  She has seen Dr. Melodie Bouillon in the past regarding her right atrial thrombus on dialysis catheter  Discussed with patient.  [name] amenable.  Candee Furbish, MD

## 2022-09-30 NOTE — Anesthesia Postprocedure Evaluation (Signed)
Anesthesia Post Note  Patient: Heather [name]  Procedure(s) Performed: TRANSESOPHAGEAL ECHOCARDIOGRAM (TEE)     Patient location during evaluation: PACU Anesthesia Type: MAC Level of consciousness: awake and alert Pain management: pain level controlled Vital Signs Assessment: post-procedure vital signs reviewed and stable Respiratory status: spontaneous breathing, nonlabored ventilation and respiratory function stable Cardiovascular status: stable and blood pressure returned to baseline Anesthetic complications: no   No notable events documented.  Last Vitals:  Vitals:   09/30/22 1226 09/30/22 1236  BP: (!) 109/57 114/66  Pulse: 78 80  Resp: 14 20  Temp:    SpO2: 93% 95%    Last Pain:  Vitals:   09/30/22 1236  TempSrc:   PainSc: 0-No pain                 Audry Pili

## 2022-09-30 NOTE — Consult Note (Signed)
[facility] Infect[facility] Disease    Date of Admission:  09/30/2022     Reason for Consult: Mitral valve vegetation     Referring Physician: Dr Roosevelt Locks  Current antibiotics: None  ASSESSMENT:    51 y.o. 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  She has had several weeks of symptoms that could be consistent with subacute bacterial endocarditis with fatigue, chills, night sweats.  She has not had any blood cultures done as of yet.  RECOMMENDATIONS:    Blood cultures Start vancomycin, cefepime after blood cultures done.  Pharmacy to dose Recommend IR evaluation for HD line removal, if possible.  Not sure what her vasculature looks like for a full line holiday but that would be the preference Would also coordinate with nephrology to time her HD with possible line removal Other serologies have been ordered to look for non-infectious causes of endocarditis as well Will follow   Principal Problem:   Endocarditis   MEDICATIONS:    Scheduled Meds:  amLODipine-olmesartan  1 tablet Oral Daily   apixaban  5 mg Oral Daily   calcium acetate  2,001 mg Oral TID WC   [START ON 10/01/2022] gabapentin  300 mg Oral Q M,W,F   guaiFENesin  1,200 mg Oral BID   insulin aspart  0-6 Units Subcutaneous TID WC   insulin degludec  26 Units Subcutaneous QHS   Continuous Infusions:  sodium chloride Stopped (09/30/22 1237)   PRN Meds:.benzonatate  HPI:    Heather [name] is a 51 y.o. female with a past medical history of end-stage renal disease on hemodialysis, diabetes, diabetic neuropathy, history of PermCath thrombus, right BKA who is being admitted to the hospital after undergoing TEE evaluation today and found to have a new mitral valve vegetation.  Patient is currently in endoscopy.    Patient receives her  dialysis via tunneled HD catheter which she has done so for a few years.  She has failed with the use of an AV fistula for her dialysis.  She has had several issues regarding her dialysis catheters.  In February 2023, she went to [facility] for attempted HERO graft surgery.  This was complicated by PEA arrest requiring chest compressions and epinephrine with ROSC at 3 minutes.  Graft surgery was abandoned at that time.  During this admission at [facility], she had an intraoperative TEE that noted a right atrial mass concerning for thrombus at her PermCath.  She was discharged from [facility] with 3 months of anticoagulation.    She saw Dr. Harl Bowie in March 2023 for hospital follow-up.  She had a repeat echocardiogram in May 2023 which noted that the clot was still there and she was continued on Eliquis.  She was referred to cardiothoracic surgery and saw Dr. Kipp Brood in May 2023.  This was to be considered for angio vac procedure, however, it was felt that she would be too high risk for catheter-based debridement and further management with anticoagulation alone was recommended.    Patient more recently had a limited TTE showing a small mobile mass on the mitral valve with RA not well visualized.  She was scheduled for TEE which was done today.  This showed a large mitral valve vegetation.  Dialysis catheter was noted in the right atrium with thrombus like material on the tip she was subsequently  admitted to the hospital following this TEE finding.  Blood cultures have been ordered and other autoimmune labs were recommended by Dr. Gale Journey which are pending.  Antibiotics were held.  Patient also has a history of chronic cough.  This is not new.  She has seen pulmonology in the past.  She had prior CT imaging showing micronodular tree-in-bud opacities in bilateral upper lobes.  She underwent bronchoscopy in September 2022.  AFB and fungal cultures were negative at that time.  She saw pulmonology as recently as July 2023.  It was felt  that her chronic cough symptoms seem to be related to upper airway irritation from postnasal drainage possibly.    She reports no fevers at home.   She does report about 2 months of night sweats on a consistent basis.  She has been much more fatigued as well.  For example, she typically could propel herself in the wheel chair down hall way with no issues but now doing so would cause much more fatigue.  She has also felt chills and feeling cold which she thought was odd because of how warm the weather has been.     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[place]), nonhemorrhagic, inferior right cerebellum 12/03/2017   left side weakness in leg   Cyst of left ovary 01/12/2018   Diabetic neuropathy, painful ([place]), on low dose Gabapentin 07/13/2013   DM (diabetes mellitus), type 2, uncontrolled, with renal complications 91/47/8295   DM2 (diabetes mellitus, type 2) ([place])    Dysfunction of left eustachian tube, with pusatile tinnitus 11/24/2017   Dyslipidemia associated with type 2 diabetes mellitus ([place]) 06/25/2016   ESRD (end stage renal disease) on dialysis [facility])    On hemodialysis in July 2019 via [facility] then switched to [facility] in Nov 2019.    ESRD with anemia ([facility])    Fibromyalgia    Gastroparesis due to DM    GERD (gastroesophageal reflux disease)    Headache    Hypertension associated with diabetes ([place]) 02/19/2011   IBS (irritable bowel syndrome)    Left-sided weakness 12/10/2017   .   Leiomyoma of uterus    Pancreatitis    PE (pulmonary thromboembolism) ([place]) 11/04/2019   Pneumonia    Proliferative diabetic retinopathy ([place]) 09/29/2015   Pronation deformity of both feet 06/14/2014   Reactive depression 12/10/2017   .   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   Thromboembolism ([place]) 04/05/2018   Upper airway cough syndrome, with recs to stay off ACE and take Pepcid q hs 11/15/2016    Social History   Tobacco Use   Smoking status: Never   Smokeless tobacco: Never  Vaping Use   Vaping Use: Never used  Substance Use Topics   Alcohol use: No    Alcohol/week: 0.0 standard drinks of alcohol   Drug use: No    Family History  Problem Relation Age of Onset   Colon polyps Mother    Diabetes Mother    Heart murmur Father        history essentially unknown   Hypertension Father    Diabetes Sister    Kidney failure Sister        kidney transplant   Diabetes Brother  Retinal degeneration Brother    Heart murmur Son    Stroke Paternal Grandmother    Diabetes Sister     Allergies  Allergen Reactions   Ibuprofen Other (See Comments)    CKD stage 3. Should avoid.   Dilaudid [Hydromorphone Hcl] Itching and Other (See Comments)    Can take with Benadryl.   Iodinated Contrast Media Rash   Tramadol Itching    Review of Systems  All other systems reviewed and are negative.   OBJECTIVE:   Blood pressure 114/66, pulse 80, temperature (!) 97.5 F (36.4 C), temperature source Tympanic, resp. rate 20, height '5\' 9"'$  (1.753 m), weight 107 kg, last menstrual period 09/10/2021, SpO2 95 %. Body mass index is 34.85 kg/m.  Physical Exam Constitutional:      General: [name] not in acute distress.    Appearance: Normal appearance.  HENT:     Head: Normocephalic and atraumatic.  Eyes:     Extraocular Movements: Extraocular movements intact.     Conjunctiva/sclera: Conjunctivae normal.  Cardiovascular:     Rate and Rhythm: Normal rate and regular rhythm.     Comments: Left sided tunneled HD line. Pulmonary:     Effort: Pulmonary effort is normal. No respiratory distress.  Abdominal:     General: There is no distension.     Palpations: Abdomen is soft.   Musculoskeletal:        General: No swelling. Normal range of motion.     Cervical back: Normal range of motion and neck supple.     Comments: Right BKA  Skin:    General: Skin is warm and dry.     Findings: No rash.  Neurological:     General: No focal deficit present.     Mental Status: [name] alert and oriented to person, place, and time.  Psychiatric:        Mood and Affect: Mood normal.        Behavior: Behavior normal.      Lab Results: Lab Results  Component Value Date   WBC 7.2 09/21/2022   HGB 16.0 (H) 09/30/2022   HCT 47.0 (H) 09/30/2022   MCV 101.7 (H) 09/21/2022   PLT 261 09/21/2022    Lab Results  Component Value Date   NA 142 09/30/2022   K 3.9 09/30/2022   CO2 24 09/21/2022   GLUCOSE 154 (H) 09/30/2022   BUN 28 (H) 09/30/2022   CREATININE 8.00 (H) 09/30/2022   CALCIUM 9.0 09/21/2022   GFRNONAA 3 (L) 09/21/2022   GFRAA 4 (L) 05/07/2020    Lab Results  Component Value Date   ALT 11 12/28/2021   AST 20 12/28/2021   ALKPHOS 71 12/28/2021   BILITOT 0.9 12/28/2021    No results found for: "CRP"  No results found for: "ESRSEDRATE"  I have reviewed the micro and lab results in Epic.  Imaging: ECHO TEE  Result Date: 09/30/2022    TRANSESOPHOGEAL ECHO REPORT   Patient Name:   Heather [name] Date of Exam: 09/30/2022 Medical Rec #:  [id number]            Height:       69.0 in Accession #:    [id number]           Weight:       236.0 lb Date of Birth:  06/12/1971           BSA:          2.217 m Patient Age:  50 years             BP:           88/49 mmHg Patient Gender: F                    HR:           86 bpm. 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 Stroke, Mitral Valve Disease,                   Signs/Symptoms:Shortness of Breath; Risk Factors:Diabetes,                  Hypertension and Dyslipidemia.  Sonographer:     Greer Pickerel Sonographer#2:   Roseanna Rainbow RDCS Referring Phys:  Jerline Pain Diagnosing Phys: Candee Furbish MD PROCEDURE: After discussion of the risks and benefits of a TEE, an informed consent was obtained from the patient. The transesophogeal probe was passed without difficulty through the esophogus of the patient. Imaged were obtained with the patient in a left lateral decubitus position. Sedation performed by different physician. The patient's vital signs; including heart rate, blood pressure, and oxygen saturation; remained stable throughout the procedure. The patient developed no complications during the procedure. IMPRESSIONS  1. Large vegetation on the mitral valve.  2. Left ventricular ejection fraction, by estimation, is 60 to 65%. The left ventricle has normal function.  3. Right ventricular systolic function is normal. The right ventricular size is normal.  4. Left atrial size was mildly dilated. No left atrial/left atrial appendage thrombus was detected.  5. Right atrial size was mildly dilated.  6. A small pericardial effusion is present. The pericardial effusion is circumferential. There is no evidence of cardiac tamponade.  7. 2.4 x 0.4 cm vegetation attached below posterior leaflet. The mitral valve is degenerative. Mild mitral valve regurgitation. No evidence of mitral stenosis. Moderate mitral annular calcification.  8. The aortic valve is normal in structure. Aortic valve regurgitation is not visualized.  9. Thrombus material on dialysis catheter leaflet tip in the right atrium (chronic seen on prior study). Conclusion(s)/Recommendation(s): Findings are concerning for vegetation/infective endocarditis as detailed above. Admit to hospital for ID/CT surgery consultation. FINDINGS  Left Ventricle: Left ventricular ejection fraction, by estimation, is 60 to 65%. The left  ventricle has normal function. The left ventricular internal cavity size was normal in size. Right Ventricle: The right ventricular size is normal. No increase in right ventricular wall thickness. Right ventricular systolic function is normal. Left Atrium: Left atrial size was mildly dilated. No left atrial/left atrial appendage thrombus was detected. Right Atrium: Right atrial size was mildly dilated. Pericardium: A small pericardial effusion is present. The pericardial effusion is circumferential. There is no evidence of cardiac tamponade. Mitral Valve: 2.4 x 0.4 cm vegetation attached below posterior leaflet. The mitral valve is degenerative in appearance. Moderate mitral annular calcification. A large vegetation is seen on the posterior mitral leaflet. The MV vegetation measures 24 mm x 4 mm. Mild mitral valve regurgitation. No evidence of mitral valve stenosis. MV peak gradient, 6.0 mmHg. The mean mitral valve gradient is 3.0 mmHg. Tricuspid Valve: The tricuspid valve is normal  in structure. Tricuspid valve regurgitation is mild. Aortic Valve: The aortic valve is normal in structure. Aortic valve regurgitation is not visualized. Pulmonic Valve: The pulmonic valve was normal in structure. Pulmonic valve regurgitation is trivial. Aorta: The aortic root is normal in size and structure. IAS/Shunts: No atrial level shunt detected by color flow Doppler.  MITRAL VALVE              TRICUSPID VALVE MV Peak grad: 6.0 mmHg    TR Peak grad:   25.4 mmHg MV Mean grad: 3.0 mmHg    TR Vmax:        252.00 cm/s MV Vmax:      1.22 m/s MV Vmean:     86.2 cm/s MR Peak grad: 89.5 mmHg MR Mean grad: 54.0 mmHg MR Vmax:      473.00 cm/s MR Vmean:     345.0 cm/s Candee Furbish MD Electronically signed by Candee Furbish MD Signature Date/Time: 09/30/2022/2:14:15 PM    Final      Imaging independently reviewed in Epic.  [facility] [phone] pager 09/30/2022, 2:37  PM  I have personally spent 80 minutes involved in face-to-face and non-face-to-face activities for this patient on the day of the visit. Professional time spent includes the following activities: Preparing to see the patient (review of tests), Obtaining and/or reviewing separately obtained history (admission/discharge record), Performing a medically appropriate examination and/or evaluation , Ordering medications/tests/procedures, referring and communicating with other health care professionals, Documenting clinical information in the EMR, Independently interpreting results (not separately reported), Communicating results to the patient/family/caregiver, Counseling and educating the patient/family/caregiver and Care coordination (not separately reported).

## 2022-09-30 NOTE — Transfer of Care (Signed)
Immediate Anesthesia Transfer of Care Note  Patient: ELIZZIE WESTERGARD  Procedure(s) Performed: TRANSESOPHAGEAL ECHOCARDIOGRAM (TEE)  Patient Location: Endoscopy Unit  Anesthesia Type:MAC  Level of Consciousness: drowsy  Airway & Oxygen Therapy: Patient Spontanous Breathing and Patient connected to nasal cannula oxygen  Post-op Assessment: Report given to RN and Post -op Vital signs reviewed and stable  Post vital signs: Reviewed and stable  Last Vitals:  Vitals Value Taken Time  BP 92/52 09/30/22 1216  Temp 36.4 C 09/30/22 1216  Pulse 78 09/30/22 1216  Resp 16 09/30/22 1216  SpO2 95 % 09/30/22 1216    Last Pain:  Vitals:   09/30/22 1216  TempSrc: Tympanic  PainSc: Asleep         Complications: No notable events documented.

## 2022-09-30 NOTE — Progress Notes (Addendum)
Pharmacy Antibiotic Note  Heather [name] is a 50 y.o. 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  Pharmacy has been consulted for Daptomycin + Cefepime dosing.   The patient is ESRD-MWF - last HD was on 10/6.    Previously the patient reported that she had severe N/V on vanc in the past and would like alternative therapy.  Thus, daptomycin was ordered/ given since 10/01/22.    On 10/04/22 Dr. Sloan Leiter reports patient prefers to rechallange with Vancomycin.  Dr. Baxter Flattery agrees and consulted pharmacy for vancomycin dosing for MV endocarditis.     Plan: Daptomycin  discontinued Start Vancomycin (re-challenge per patient preference) If patient has HD today 10/9, will order Vancomycin 2 g post HD If  does not have HD today, then will reaccess HD plans on 10/10 and give Vancomycin post HD. Continue  Cefepime 1g IV q24h Will continue to follow HD schedule/duration, culture results, LOT, and antibiotic de-escalation plans   Height: '5\' 9"'$  (175.3 cm) Weight: 107 kg (236 lb) IBW/kg (Calculated) : 66.2  Temp (24hrs), Avg:97.4 F (36.3 C), Min:97 F (36.1 C), Max:97.6 F (36.4 C)  Recent Labs  Lab 09/30/22 1110  CREATININE 8.00*     Estimated Creatinine Clearance: 10.7 mL/min (A) (by C-G formula based on SCr of 8 mg/dL (H)).    Allergies  Allergen Reactions   Ibuprofen Other (See Comments)    CKD stage 3. Should avoid.   Dilaudid [Hydromorphone Hcl] Itching and Other (See Comments)    Can take with Benadryl.   Iodinated Contrast Media Rash   Tramadol Itching    Antimicrobials this admission: Daptomycin 10/5 >>10/9 Cefepime 10/5 >> Vancomycin 10/9>>  Dose adjustments this admission:   Microbiology results: 10/7 blood Cx NG x4 days 10/8 blood Cx x2: ngtd <24 h  Nicole Cella, RPh Clinical Pharmacist 09/30/2022 9:16 PM

## 2022-09-30 NOTE — Consult Note (Signed)
ESRD Consult Note  Assessment/Recommendations:   ESRD:  -outpatient orders: [place].  MWF.  3 hours 45 minutes.  2K/2 calcium.  400/700 flow rates.  EDW 101.7 kg.  Meds: Venofer 50 mg once weekly, Hectorol 3 mcg every treatment -will plan for HD tomorrow AM followed by line holiday, NPO after midnight  Mitral valve endocarditis, tunneled dialysis catheter thrombus -s/p TEE 10/5 -ID on board, abx per primary and ID -will consult IR for line holiday. Hopefully over the weekend/48hrs. Biggest concern is her vasculature and making sure she can have a new TDC placed after line holiday  Volume/ hypertension: EDW 101.7kg. Attempt to achieve EDW as tolerated  Anemia of Chronic Kidney Disease: Hemoglobin 12.8/at goal, receiving venofer '50mg'$  qweekly but will hold this given her hgb is at goal and given endocarditis.   Secondary Hyperparathyroidism/Hyperphosphatemia: Resume home binders, taking calcium acetate 3 tablets 3 times daily AC.  We will resume Hectorol  Recommendations were discussed with the primary team.  Gean Quint, MD [facility]  History of Present Illness: Heather [name] is a/an 51 y.o. female with a past medical history of ESRD IDDM, right BKA, chronic anemia, DVT/PE on Eliquis who presents to the hospital today for TEE evaluation of newly found mitral valve vegetation.  Was also found to have thrombus like material on the tip of her dialysis catheter, hence she was admitted.  Has been having night sweats and fatigue at home.  Does have a chronic cough which now has been more productive in the last week or so.  ID has been consulted and they recommend vancomycin and cefepime along with HD line removal if possible.  Patient seen and examined on floor, spouse at bedside.  She currently reports that she feels okay.  Denies any fevers, chills, chest pain, shortness of breath, swelling, pain at catheter site. She does have a history of failed AVF/AVG.  She reports  that this is her second catheter.  She reports that her first catheter was on the right side did not function as well on dialysis.   Medications:  Current Facility-Administered Medications  Medication Dose Route Frequency Provider Last Rate Last Admin   [START ON 10/01/2022] amLODipine (NORVASC) tablet 5 mg  5 mg Oral Daily Wynetta Fines T, MD       apixaban Arne [place]) tablet 5 mg  5 mg Oral BID Opyd, Ilene Qua, MD   5 mg at 09/30/22 2040   benzonatate (TESSALON) capsule 100 mg  100 mg Oral TID PRN Wynetta Fines T, MD   100 mg at 09/30/22 2039   [START ON 10/01/2022] calcium acetate (PHOSLO) capsule 2,001 mg  2,001 mg Oral TID WC Wynetta Fines T, MD       ceFEPIme (MAXIPIME) 2 g in sodium chloride 0.9 % 100 mL IVPB  2 g Intravenous Once Pham, Minh Q, RPH-CPP 200 mL/hr at 09/30/22 2113 2 g at 09/30/22 2113   Followed by   Derrill Memo ON 10/01/2022] ceFEPIme (MAXIPIME) 1 g in sodium chloride 0.9 % 100 mL IVPB  1 g Intravenous Q24H Pham, Minh Q, RPH-CPP       [START ON 10/01/2022] Chlorhexidine Gluconate Cloth 2 % PADS 6 each  6 each Topical Q0600 Gean Quint, MD       [START ON 10/01/2022] gabapentin (NEURONTIN) capsule 300 mg  300 mg Oral Q M,W,F Wynetta Fines T, MD       guaiFENesin (MUCINEX) 12 hr tablet 1,200 mg  1,200 mg Oral BID Wynetta Fines  T, MD   1,200 mg at 09/30/22 2039   [START ON 10/01/2022] insulin aspart (novoLOG) injection 0-6 Units  0-6 Units Subcutaneous TID WC Lequita Halt, MD       insulin glargine (LANTUS) injection 26 Units  26 Units Subcutaneous QHS Lequita Halt, MD       [START ON 10/01/2022] irbesartan (AVAPRO) tablet 150 mg  150 mg Oral Daily Wynetta Fines T, MD       ondansetron [facility]) injection 4 mg  4 mg Intravenous Q6H PRN Opyd, Ilene Qua, MD   4 mg at 09/30/22 2109   vancomycin (VANCOREADY) IVPB 2000 mg/400 mL  2,000 mg Intravenous Once Pham, Minh Q, RPH-CPP       Followed by   Derrill Memo ON 10/01/2022] vancomycin (VANCOREADY) IVPB 500 mg/100 mL  500 mg Intravenous Once Pham, Minh Q,  RPH-CPP       Facility-Administered Medications Ordered in Other Encounters  Medication Dose Route Frequency Provider Last Rate Last Admin   labetalol ([place]) 5 MG/ML injection              ALLERGIES Ibuprofen, Dilaudid [hydromorphone hcl], Iodinated contrast media, and Tramadol  MEDICAL HISTORY 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[place]), nonhemorrhagic, inferior right cerebellum 12/03/2017   left side weakness in leg   Cyst of left ovary 01/12/2018   Diabetic neuropathy, painful ([place]), on low dose Gabapentin 07/13/2013   DM (diabetes mellitus), type 2, uncontrolled, with renal complications 67/67/2094   DM2 (diabetes mellitus, type 2) ([place])    Dysfunction of left eustachian tube, with pusatile tinnitus 11/24/2017   Dyslipidemia associated with type 2 diabetes mellitus ([place]) 06/25/2016   ESRD (end stage renal disease) on dialysis [facility])    On hemodialysis in July 2019 via [facility] then switched to [facility] in Nov 2019.    ESRD with anemia (HCC)    Fibromyalgia    Gastroparesis due to DM    GERD (gastroesophageal reflux disease)    Headache    Hypertension associated with diabetes ([place]) 02/19/2011   IBS (irritable bowel syndrome)    Left-sided weakness 12/10/2017   .   Leiomyoma of uterus    Pancreatitis    PE (pulmonary thromboembolism) ([place]) 11/04/2019   Pneumonia    Proliferative diabetic retinopathy ([place]) 09/29/2015   Pronation deformity of both feet 06/14/2014   Reactive depression 12/10/2017   .   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[place]) 02/17/2018   Thromboembolism ([place]) 04/05/2018   Upper airway cough syndrome, with recs to stay off ACE and take Pepcid q hs 11/15/2016     SOCIAL  HISTORY Social History   Socioeconomic History   Marital status: Married    Spouse name: Not on file   Number of children: 1   Years of education: college   Highest education level: Not on file  Occupational History   Occupation: customer service rep    Employer: [employer]  Tobacco Use   Smoking status: Never   Smokeless tobacco: Never  Vaping Use   Vaping Use: Never used  Substance and Sexual Activity   Alcohol use: No    Alcohol/week: 0.0 standard drinks of alcohol   Drug use: No   Sexual activity: Not Currently  Partners: Male    Birth control/protection: I.U.D.  Other Topics Concern   Not on file  Social History Narrative   Patient lives with fiance. She has 1 grown son. Works full-time, she Paramedic for attorney.   Caffeine Use: 2 cups daily; sodas occasionally   She has 7 grandchildren   Social Determinants of Radio broadcast assistant Strain: Not on file  Food Insecurity: Not on file  Transportation Needs: Not on file  Physical Activity: Not on file  Stress: Not on file  Social Connections: Not on file  Intimate Partner Violence: Not on file     FAMILY HISTORY Family History  Problem Relation Age of Onset   Colon polyps Mother    Diabetes Mother    Heart murmur Father        history essentially unknown   Hypertension Father    Diabetes Sister    Kidney failure Sister        kidney transplant   Diabetes Brother    Retinal degeneration Brother    Heart murmur Son    Stroke Paternal Grandmother    Diabetes Sister      Review of Systems: 12 systems were reviewed and negative except per HPI  Physical Exam: Vitals:   09/30/22 1930 09/30/22 2000  BP: (!) 174/78 (!) 167/80  Pulse: 96 97  Resp: 16 18  Temp: (!) 97 F (36.1 C) 98.6 F (37 C)  SpO2: 94% 94%   No intake/output data recorded.  Intake/Output Summary (Last 24 hours) at 09/30/2022 2113 Last data filed at 09/30/2022 1211 Gross per 24 hour  Intake 300 ml  Output --   Net 300 ml   General: well-appearing, no acute distress HEENT: anicteric sclera, MMM CV: RRR, no murmurs Lungs: CTA bl,  bilateral chest rise, normal wob Abd: soft, non-tender, non-distended Skin: no visible lesions or rashes Msk: rt bka Neuro: normal speech, no gross focal deficits  Dialysis access: [facility]  Test Results Reviewed Lab Results  Component Value Date   NA 142 09/30/2022   K 3.9 09/30/2022   CL 97 (L) 09/30/2022   CO2 24 09/21/2022   BUN 28 (H) 09/30/2022   CREATININE 8.00 (H) 09/30/2022   GFR 10.05 (LL) 06/05/2018   GLU 221 03/20/2019   CALCIUM 9.0 09/21/2022   ALBUMIN 3.4 (L) 12/28/2021   PHOS 7.4 (H) 12/19/2021    I have reviewed relevant outside healthcare records

## 2022-10-01 ENCOUNTER — Inpatient Hospital Stay (HOSPITAL_COMMUNITY): Payer: Medicare Other

## 2022-10-01 ENCOUNTER — Encounter (HOSPITAL_COMMUNITY): Payer: Self-pay | Admitting: Internal Medicine

## 2022-10-01 ENCOUNTER — Other Ambulatory Visit: Payer: Self-pay

## 2022-10-01 DIAGNOSIS — Z992 Dependence on renal dialysis: Secondary | ICD-10-CM

## 2022-10-01 DIAGNOSIS — I350 Nonrheumatic aortic (valve) stenosis: Secondary | ICD-10-CM

## 2022-10-01 DIAGNOSIS — I339 Acute and subacute endocarditis, unspecified: Secondary | ICD-10-CM | POA: Diagnosis not present

## 2022-10-01 DIAGNOSIS — I5189 Other ill-defined heart diseases: Secondary | ICD-10-CM

## 2022-10-01 DIAGNOSIS — N186 End stage renal disease: Secondary | ICD-10-CM

## 2022-10-01 HISTORY — PX: IR REMOVAL TUN CV CATH W/O FL: IMG2289

## 2022-10-01 LAB — HEPATITIS C ANTIBODY: HCV Ab: NONREACTIVE

## 2022-10-01 LAB — GLUCOSE, CAPILLARY
Glucose-Capillary: 91 mg/dL (ref 70–99)
Glucose-Capillary: 166 mg/dL — ABNORMAL HIGH (ref 70–99)

## 2022-10-01 LAB — HEPATITIS B CORE ANTIBODY, TOTAL: Hep B Core Total Ab: REACTIVE — AB

## 2022-10-01 LAB — HEPATITIS B SURFACE ANTIBODY,QUALITATIVE: Hep B S Ab: REACTIVE — AB

## 2022-10-01 MED ORDER — PENTAFLUOROPROP-TETRAFLUOROETH EX AERO: 1.0000 | INHALATION_SPRAY | CUTANEOUS | Status: DC | PRN

## 2022-10-01 MED ORDER — CEFAZOLIN SODIUM-DEXTROSE 2-4 GM/100ML-% IV SOLN: 2.0000 g | INTRAVENOUS | Status: DC

## 2022-10-01 MED ORDER — ACETAMINOPHEN 325 MG PO TABS
650.0000 mg | ORAL_TABLET | Freq: Four times a day (QID) | ORAL | Status: DC | PRN
  Administered 2022-10-01 (×2): 650 mg via ORAL
  Filled 2022-10-01 (×4): qty 2

## 2022-10-01 MED ORDER — ALTEPLASE 2 MG IJ SOLR: 2.0000 mg | Freq: Once | INTRAMUSCULAR | Status: DC | PRN

## 2022-10-01 MED ORDER — HEPARIN SODIUM (PORCINE) 1000 UNIT/ML DIALYSIS: 1000.0000 [IU] | INTRAMUSCULAR | Status: DC | PRN

## 2022-10-01 MED ORDER — LIDOCAINE-PRILOCAINE 2.5-2.5 % EX CREA: 1.0000 | TOPICAL_CREAM | CUTANEOUS | Status: DC | PRN

## 2022-10-01 MED ORDER — LIDOCAINE HCL (PF) 1 % IJ SOLN: 5.0000 mL | INTRAMUSCULAR | Status: DC | PRN

## 2022-10-01 MED ORDER — ANTICOAGULANT SODIUM CITRATE 4% (200MG/5ML) IV SOLN
5.0000 mL | Status: DC | PRN
  Filled 2022-10-01: qty 5

## 2022-10-01 MED ORDER — LIDOCAINE HCL 1 % IJ SOLN
INTRAMUSCULAR | Status: AC
  Filled 2022-10-01: qty 20

## 2022-10-01 MED ORDER — OXYCODONE HCL 5 MG PO TABS
5.0000 mg | ORAL_TABLET | Freq: Once | ORAL | Status: AC | PRN
  Administered 2022-10-04: 5 mg via ORAL
  Filled 2022-10-01 (×2): qty 1

## 2022-10-01 NOTE — TOC Initial Note (Addendum)
Transition of Care [facility]) - Initial/Assessment Note    Patient Details  Name: Heather [name] MRN: [id number] Date of Birth: 03-23-1971  [facility] CM/SW Contact:    Verdell Carmine, RN Phone Number: 10/01/2022, 11:31 AM  Clinical Narrative:                 51 YO admitted with endocarditis, has ESRD on dialysis, BKA. Patient has a continued thrombus at catheter tip hemodialysis line. Will likely need weeks of IV ABX. Messaged Carolynn Sayers from [facility] infusion to give her a heads up about the patient.  She may just receive antibiotics post dialysis.   The patient had consulted with Dr. Kipp Brood for poss Angiovac, but he felt at the time not a good surgical candidate.  Patient is from home with spouse.  CM will follow for needs, recommendations, and transistions  Expected Discharge Plan: [place] Barriers to Discharge: Continued Medical Work up   Patient Goals and CMS Choice        Expected Discharge Plan and Services Expected Discharge Plan: [place]   Discharge Planning Services: CM Consult Post Acute Care Choice: Durable Medical Equipment Living arrangements for the past 2 months: Single Family Home                                      Prior Living Arrangements/Services Living arrangements for the past 2 months: Single Family Home Lives with:: Spouse                   Activities of Daily Living Home Assistive Devices/Equipment: Wheelchair ADL Screening (condition at time of admission) Patient's cognitive ability adequate to safely complete daily activities?: Yes Is the patient deaf or have difficulty hearing?: No Does the patient have difficulty seeing, even when wearing glasses/contacts?: No Does the patient have difficulty concentrating, remembering, or making decisions?: No Patient able to express need for assistance with ADLs?: Yes Does the patient have difficulty dressing or bathing?:  No Independently performs ADLs?: Yes (appropriate for developmental age) Does the patient have difficulty walking or climbing stairs?: Yes (Right BKA) Weakness of Legs: Right Weakness of Arms/Hands: None  Permission Sought/Granted                  Emotional Assessment              Admission diagnosis:  Endocarditis [I38] Patient Active Problem List   Diagnosis Date Noted   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   Shortness of breath 07/02/2022   Thrombus of venous dialysis catheter ([place]) 02/25/2022   ESRD (end stage renal disease) ([place]) 12/29/2021   Displacement of vascular dialysis catheter, initial encounter ([place]) 12/18/2021   S/P BKA (below knee amputation), right ([place]) 03/31/2021   Impaired mobility and ADLs 03/25/2021   CVA (cerebral vascular accident) ([place]) 03/04/2021   Pulmonary embolus ([place]) 03/04/2021   Charcot ankle, right 02/23/2021   Hx of cardiac arrest 11/12/2020   Abnormal CT of the chest 09/26/2020   Allergy, unspecified, initial encounter 09/22/2020   Fluid overload, unspecified 06/10/2020   Other mechanical complication of other cardiac and vascular devices and implants, initial encounter ([place]) 05/10/2020   Positive ANA (antinuclear antibody) 04/16/2020   ESRD on dialysis [facility])    Hemiplegia and hemiparesis following cerebral infarction affecting left non-dominant side ([place]) 12/29/2018   IBS (irritable bowel syndrome) 08/06/2017   Dyslipidemia  associated with type 2 diabetes mellitus ([place]) 06/25/2016   Proliferative diabetic retinopathy ([place]) 09/29/2015   Chronic cough 10/04/2014   B12 deficiency 05/10/2014   Anemia in chronic kidney disease, on chronic dialysis ([place]) 04/26/2014   CKD (chronic kidney disease) stage 5, GFR less than 15 ml/min ([place]) 04/26/2014   Secondary hyperparathyroidism of renal origin ([place]) 11/27/2013   Posterior subcapsular cataract, bilateral 10/18/2013   Diabetes mellitus, type II, insulin dependent ([place]), on CGM, with end organ damage:  renal, neuropathy, gastroparesis, retinopathy 02/19/2011   Hypertension associated with diabetes ([place]) 02/19/2011   Gastroparesis due to DM ([place]) 02/19/2011   PCP:  [name], MD Pharmacy:   [id number] [place]Phone: [phone]: [phone]    Social Determinants of Health (SDOH) Interventions    Readmission Risk Interventions     No data to display

## 2022-10-01 NOTE — Progress Notes (Addendum)
Pt receives out-pt HD at [place] [place] on MWF. Pt arrives at 6:05 am for 6:15 chair time. Will assist as needed.   Melven Sartorius Renal Navigator [phone]

## 2022-10-01 NOTE — Progress Notes (Signed)
Received patient in bed to unit.  Alert and oriented.  Informed consent signed and in chart.   Treatment initiated:0840  Treatment completed: 1230  Patient tolerated well.  Transported back to the room  Alert, without acute distress.  Hand-off given to patient's nurse.   Access used: Cath  Access issues: Cath to be pulled after dialysis tx.   Total UF removed: 1.3 Medication(s) given: Hectorol  Post HD VS: 134/69,97.6,95%,15 Post HD weight: 106.5kg   [facility]

## 2022-10-01 NOTE — Progress Notes (Signed)
[place] for Infectious Disease  Date of Admission:  09/30/2022           Reason for visit: Follow up on mitral valve endocarditis  Current antibiotics: Daptomycin Cefepime  ASSESSMENT:    50 y.o. female admitted with:  #Mitral valve vegetation concerning for infectious endocarditis #Tunneled HD line in place #PermCath thrombus  Patient presented from endoscopy as a direct admission yesterday when TEE noted a large mitral valve vegetation concerning for infectious endocarditis.  Additionally, she has reported several weeks of symptoms that could be consistent with subacute bacterial endocarditis including fatigue, chills, and night sweats.  Blood cultures were drawn yesterday which are currently no growth to date.  She was started on broad-spectrum antibiotics following blood culture collection.  RECOMMENDATIONS:    Continue daptomycin in place of vancomycin given her report of severe nausea and vomiting with vancomycin Continue cefepime IR consulted for possible line holiday as long as her vasculature will allow for a new TDC placement Will follow Dr. Gale Journey here as needed over the weekend.  New ID team will resume care on Monday   Principal Problem:   Endocarditis    MEDICATIONS:    Scheduled Meds: . amLODipine  5 mg Oral Daily  . apixaban  5 mg Oral BID  . calcium acetate  2,001 mg Oral TID WC  . Chlorhexidine Gluconate Cloth  6 each Topical Q0600  . doxercalciferol  3 mcg Intravenous Q M,W,F-HD  . gabapentin  300 mg Oral Q M,W,F  . guaiFENesin  1,200 mg Oral BID  . insulin aspart  0-6 Units Subcutaneous TID WC  . insulin glargine  26 Units Subcutaneous QHS  . irbesartan  150 mg Oral Daily   Continuous Infusions: . ceFEPime (MAXIPIME) IV    . DAPTOmycin (CUBICIN) 650 mg in sodium chloride 0.9 % IVPB 650 mg (10/01/22 0127)   PRN Meds:.acetaminophen, benzonatate, ondansetron (ZOFRAN) IV, oxyCODONE  SUBJECTIVE:   24 hour events:  Patient was admitted  to [address] from the endoscopy center She was seen by nephrology Antibiotic started.  Daptomycin was substituted for vancomycin given report of vomiting with vancomycin Cefepime initiated Afebrile, Tmax 98.6 Blood cultures obtained No other acute events noted  No new complaints Seen on HD Reiterates her symptoms described yesterday of 2 months of fatigue, chills, night sweats.  Does not report any other new symptoms that she can think of  Review of Systems  All other systems reviewed and are negative.     OBJECTIVE:   Blood pressure 127/66, pulse 86, temperature 98.3 F (36.8 C), temperature source Oral, resp. rate 15, height '5\' 9"'$  (1.753 m), weight 107 kg, last menstrual period 09/10/2021, SpO2 94 %. Body mass index is 34.85 kg/m.  Physical Exam Constitutional:      General: [name] not in acute distress.    Appearance: Normal appearance.  HENT:     Head: Normocephalic and atraumatic.  Eyes:     Extraocular Movements: Extraocular movements intact.     Conjunctiva/sclera: Conjunctivae normal.  Cardiovascular:     Comments: Left-sided HD cath in place Pulmonary:     Effort: Pulmonary effort is normal. No respiratory distress.  Abdominal:     General: There is no distension.     Palpations: Abdomen is soft.  Musculoskeletal:        General: Normal range of motion.  Skin:    General: Skin is warm and dry.  Neurological:     General: No focal  deficit present.     Mental Status: [name] alert and oriented to person, place, and time.  Psychiatric:        Mood and Affect: Mood normal.        Behavior: Behavior normal.      Lab Results: Lab Results  Component Value Date   WBC 9.4 09/30/2022   HGB 12.8 09/30/2022   HCT 41.2 09/30/2022   MCV 101.2 (H) 09/30/2022   PLT 134 (L) 09/30/2022    Lab Results  Component Value Date   NA 139 09/30/2022   K 3.3 (L) 09/30/2022   CO2 27 09/30/2022   GLUCOSE 244 (H) 09/30/2022   BUN 29 (H) 09/30/2022   CREATININE 8.92 (H)  09/30/2022   CALCIUM 9.6 09/30/2022   GFRNONAA 5 (L) 09/30/2022   GFRAA 4 (L) 05/07/2020    Lab Results  Component Value Date   ALT 12 09/30/2022   AST 15 09/30/2022   ALKPHOS 98 09/30/2022   BILITOT 0.7 09/30/2022       Component Value Date/Time   CRP 6.8 (H) 09/30/2022 2023       Component Value Date/Time   ESRSEDRATE 52 (H) 09/30/2022 2023     I have reviewed the micro and lab results in Epic.  Imaging: ECHO TEE  Result Date: 09/30/2022    TRANSESOPHOGEAL ECHO REPORT   Patient Name:   Heather [name] Date of Exam: 09/30/2022 Medical Rec #:  [id number]            Height:       69.0 in Accession #:    [id number]           Weight:       236.0 lb Date of Birth:  12/17/1971           BSA:          2.217 m Patient Age:    76 years             BP:           88/49 mmHg Patient Gender: F                    HR:           86 bpm. 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 Stroke, Mitral Valve Disease,                  Signs/Symptoms:Shortness of Breath; Risk Factors:Diabetes,                  Hypertension and Dyslipidemia.  Sonographer:     Greer Pickerel Sonographer#2:   Roseanna Rainbow RDCS Referring Phys:  Jerline Pain Diagnosing Phys: Candee Furbish MD PROCEDURE: After discussion of the risks and benefits of a TEE, an informed consent was obtained from the patient. The transesophogeal probe was passed without difficulty through the esophogus of the patient. Imaged were obtained with the patient in a left lateral decubitus position. Sedation performed by different physician. The patient's vital signs; including heart rate, blood pressure, and oxygen saturation; remained stable throughout the procedure. The patient developed no complications during the procedure.  IMPRESSIONS  1.  Large vegetation on the mitral valve.  2. Left ventricular ejection fraction, by estimation, is 60 to 65%. The left ventricle has normal function.  3. Right ventricular systolic function is normal. The right ventricular size is normal.  4. Left atrial size was mildly dilated. No left atrial/left atrial appendage thrombus was detected.  5. Right atrial size was mildly dilated.  6. A small pericardial effusion is present. The pericardial effusion is circumferential. There is no evidence of cardiac tamponade.  7. 2.4 x 0.4 cm vegetation attached below posterior leaflet. The mitral valve is degenerative. Mild mitral valve regurgitation. No evidence of mitral stenosis. Moderate mitral annular calcification.  8. The aortic valve is normal in structure. Aortic valve regurgitation is not visualized.  9. Thrombus material on dialysis catheter leaflet tip in the right atrium (chronic seen on prior study). Conclusion(s)/Recommendation(s): Findings are concerning for vegetation/infective endocarditis as detailed above. Admit to hospital for ID/CT surgery consultation. FINDINGS  Left Ventricle: Left ventricular ejection fraction, by estimation, is 60 to 65%. The left ventricle has normal function. The left ventricular internal cavity size was normal in size. Right Ventricle: The right ventricular size is normal. No increase in right ventricular wall thickness. Right ventricular systolic function is normal. Left Atrium: Left atrial size was mildly dilated. No left atrial/left atrial appendage thrombus was detected. Right Atrium: Right atrial size was mildly dilated. Pericardium: A small pericardial effusion is present. The pericardial effusion is circumferential. There is no evidence of cardiac tamponade. Mitral Valve: 2.4 x 0.4 cm vegetation attached below posterior leaflet. The mitral valve is degenerative in appearance. Moderate mitral annular calcification. A large vegetation is seen on the posterior mitral  leaflet. The MV vegetation measures 24 mm x 4 mm. Mild mitral valve regurgitation. No evidence of mitral valve stenosis. MV peak gradient, 6.0 mmHg. The mean mitral valve gradient is 3.0 mmHg. Tricuspid Valve: The tricuspid valve is normal in structure. Tricuspid valve regurgitation is mild. Aortic Valve: The aortic valve is normal in structure. Aortic valve regurgitation is not visualized. Pulmonic Valve: The pulmonic valve was normal in structure. Pulmonic valve regurgitation is trivial. Aorta: The aortic root is normal in size and structure. IAS/Shunts: No atrial level shunt detected by color flow Doppler.  MITRAL VALVE              TRICUSPID VALVE MV Peak grad: 6.0 mmHg    TR Peak grad:   25.4 mmHg MV Mean grad: 3.0 mmHg    TR Vmax:        252.00 cm/s MV Vmax:      1.22 m/s MV Vmean:     86.2 cm/s MR Peak grad: 89.5 mmHg MR Mean grad: 54.0 mmHg MR Vmax:      473.00 cm/s MR Vmean:     345.0 cm/s Candee Furbish MD Electronically signed by Candee Furbish MD Signature Date/Time: 09/30/2022/2:14:15 PM    Final      Imaging independently reviewed in Epic.    Raynelle [facility] for Infectious Disease [facility] [phone] pager 10/01/2022, 8:10 AM

## 2022-10-01 NOTE — Progress Notes (Signed)
[facility] Progress Note   Subjective:   Patient seen and examined at bedside in dialysis.  Reports chills this AM and constipation.  Otherwise feeling ok. Denies CP, SOB, n/v/d, weakness, dizziness and fatigue. Admits to possible weight loss recently.    Objective Vitals:   10/01/22 0900 10/01/22 0930 10/01/22 1000 10/01/22 1030  BP: (!) 158/78 138/79 122/65 121/65  Pulse: 86 88 83 82  Resp: '18 20 20 18  '$ Temp:      TempSrc:      SpO2: 94% 93% 97% 95%  Weight:      Height:       Physical Exam General:WDWN female in NAD Heart:RRR, no MRG Lungs:CTAB, nml WOB on RA Abdomen:soft, NTND Extremities:no LE edema Dialysis Access: [facility]   Filed Weights   09/30/22 0958  Weight: 107 kg    Intake/Output Summary (Last 24 hours) at 10/01/2022 1105 Last data filed at 09/30/2022 1211 Gross per 24 hour  Intake 300 ml  Output --  Net 300 ml    Additional Objective Labs: Basic Metabolic Panel: Recent Labs  Lab 09/30/22 1110 09/30/22 2023  NA 142 139  K 3.9 3.3*  CL 97* 96*  CO2  --  27  GLUCOSE 154* 244*  BUN 28* 29*  CREATININE 8.00* 8.92*  CALCIUM  --  9.6   Liver Function Tests: Recent Labs  Lab 09/30/22 2023  AST 15  ALT 12  ALKPHOS 98  BILITOT 0.7  PROT 8.1  ALBUMIN 3.4*   CBC: Recent Labs  Lab 09/30/22 1110 09/30/22 2023  WBC  --  9.4  HGB 16.0* 12.8  HCT 47.0* 41.2  MCV  --  101.2*  PLT  --  134*   Blood Culture    Component Value Date/Time   SDES BLOOD LEFT ANTECUBITAL 09/30/2022 2022   SPECREQUEST  09/30/2022 2022    BOTTLES DRAWN AEROBIC AND ANAEROBIC Blood Culture adequate volume   CULT  09/30/2022 2022    NO GROWTH < 12 HOURS Performed at [facility], [place] [address] [place], [place] [address]    REPTSTATUS PENDING 09/30/2022 2022    Cardiac Enzymes: Recent Labs  Lab 09/30/22 2023  CKTOTAL 73   CBG: Recent Labs  Lab 09/30/22 1739 09/30/22 2117  GLUCAP 214* 249*    Studies/Results: ECHO TEE  Result  Date: 09/30/2022    TRANSESOPHOGEAL ECHO REPORT   Patient Name:   Heather [name] Date of Exam: 09/30/2022 Medical Rec #:  [id number]            Height:       69.0 in Accession #:    [id number]           Weight:       236.0 lb Date of Birth:  August 28, 1971           BSA:          2.217 m Patient Age:    51 years             BP:           88/49 mmHg Patient Gender: F                    HR:           86 bpm. Exam Location:  Inpatient Procedure: 3D Echo, Transesophageal Echo, Cardiac Doppler and Color Doppler Indications:     I34.8 Other nonrheumatic mitral valve disorders  History:  Patient has prior history of Echocardiogram examinations, most                  recent 05/06/2020. Stroke, Mitral Valve Disease,                  Signs/Symptoms:Shortness of Breath; Risk Factors:Diabetes,                  Hypertension and Dyslipidemia.  Sonographer:     Greer Pickerel Sonographer#2:   Roseanna Rainbow RDCS Referring Phys:  Jerline Pain Diagnosing Phys: Candee Furbish MD PROCEDURE: After discussion of the risks and benefits of a TEE, an informed consent was obtained from the patient. The transesophogeal probe was passed without difficulty through the esophogus of the patient. Imaged were obtained with the patient in a left lateral decubitus position. Sedation performed by different physician. The patient's vital signs; including heart rate, blood pressure, and oxygen saturation; remained stable throughout the procedure. The patient developed no complications during the procedure. IMPRESSIONS  1. Large vegetation on the mitral valve.  2. Left ventricular ejection fraction, by estimation, is 60 to 65%. The left ventricle has normal function.  3. Right ventricular systolic function is normal. The right ventricular size is normal.  4. Left atrial size was mildly dilated. No left atrial/left atrial appendage thrombus was detected.  5. Right atrial size was mildly dilated.  6. A small pericardial effusion is present. The pericardial  effusion is circumferential. There is no evidence of cardiac tamponade.  7. 2.4 x 0.4 cm vegetation attached below posterior leaflet. The mitral valve is degenerative. Mild mitral valve regurgitation. No evidence of mitral stenosis. Moderate mitral annular calcification.  8. The aortic valve is normal in structure. Aortic valve regurgitation is not visualized.  9. Thrombus material on dialysis catheter leaflet tip in the right atrium (chronic seen on prior study). Conclusion(s)/Recommendation(s): Findings are concerning for vegetation/infective endocarditis as detailed above. Admit to hospital for ID/CT surgery consultation. FINDINGS  Left Ventricle: Left ventricular ejection fraction, by estimation, is 60 to 65%. The left ventricle has normal function. The left ventricular internal cavity size was normal in size. Right Ventricle: The right ventricular size is normal. No increase in right ventricular wall thickness. Right ventricular systolic function is normal. Left Atrium: Left atrial size was mildly dilated. No left atrial/left atrial appendage thrombus was detected. Right Atrium: Right atrial size was mildly dilated. Pericardium: A small pericardial effusion is present. The pericardial effusion is circumferential. There is no evidence of cardiac tamponade. Mitral Valve: 2.4 x 0.4 cm vegetation attached below posterior leaflet. The mitral valve is degenerative in appearance. Moderate mitral annular calcification. A large vegetation is seen on the posterior mitral leaflet. The MV vegetation measures 24 mm x 4 mm. Mild mitral valve regurgitation. No evidence of mitral valve stenosis. MV peak gradient, 6.0 mmHg. The mean mitral valve gradient is 3.0 mmHg. Tricuspid Valve: The tricuspid valve is normal in structure. Tricuspid valve regurgitation is mild. Aortic Valve: The aortic valve is normal in structure. Aortic valve regurgitation is not visualized. Pulmonic Valve: The pulmonic valve was normal in structure.  Pulmonic valve regurgitation is trivial. Aorta: The aortic root is normal in size and structure. IAS/Shunts: No atrial level shunt detected by color flow Doppler.  MITRAL VALVE              TRICUSPID VALVE MV Peak grad: 6.0 mmHg    TR Peak grad:   25.4 mmHg MV Mean grad: 3.0 mmHg  TR Vmax:        252.00 cm/s MV Vmax:      1.22 m/s MV Vmean:     86.2 cm/s MR Peak grad: 89.5 mmHg MR Mean grad: 54.0 mmHg MR Vmax:      473.00 cm/s MR Vmean:     345.0 cm/s Candee Furbish MD Electronically signed by Candee Furbish MD Signature Date/Time: 09/30/2022/2:14:15 PM    Final     Medications:  ceFEPime (MAXIPIME) IV     DAPTOmycin (CUBICIN) 650 mg in sodium chloride 0.9 % IVPB 650 mg (10/01/22 0127)    amLODipine  5 mg Oral Daily   apixaban  5 mg Oral BID   calcium acetate  2,001 mg Oral TID WC   Chlorhexidine Gluconate Cloth  6 each Topical Q0600   doxercalciferol  3 mcg Intravenous Q M,W,F-HD   gabapentin  300 mg Oral Q M,W,F   guaiFENesin  1,200 mg Oral BID   insulin aspart  0-6 Units Subcutaneous TID WC   insulin glargine  26 Units Subcutaneous QHS   irbesartan  150 mg Oral Daily    Dialysis Orders: [facility]  MWF.  3 hours 45 minutes.  2K/2 Ca  400/700 flow rates.  EDW 101.7 kg.    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 IR consulted for possible line holiday as long as vasculature permits new TDC placement.  Hopefully can have [place] removed today after HD.  BC ordered. On cefepime & daptomycin. ABX per ID.  2. ESRD - on HD MWF.  HD today per regular schedule.  Plan for HD Monday or Tuesday following line holiday and replacement.  3. Anemia of CKD- Hgb 12.8. No ESA indicated. Hold IV iron with acute infection.  4. Secondary hyperparathyroidism - Ca at goal. Will check phos. Continue home binders and VDRA.   5. HTN/volume - BP improved with HD.  At EDW, challenge today.  Likely with weight loss and will need  lower dry on d/c.  6. Nutrition - Renal diet w/fluid restrictions.   Jen Mow, PA-C [facility] 10/01/2022,11:05 AM  LOS: 1 day

## 2022-10-01 NOTE — Consult Note (Signed)
Chief Complaint: Patient was seen in consultation today for bacteremia.  Referring Physician(s): Dr. Jonnie Finner  Supervising Physician: Markus Daft  Patient Status: [facility] - In-pt  History of Present Illness: Heather [name] is a 51 y.o. female with a past medical history significant for CVA, previous PE, HTN, DM and ESRD on HD who presented to [facility] ED yesterday with concern for endocarditis.  Blood cultures obtained yesterday are pending with NGTD.  Request made for line removal for catheter holiday with replacement early next week.   PA assessed patient in Radiology at the time of line removal.  [name] understanding of the goals of the procedure and is agreeable to proceed.    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[place]), nonhemorrhagic, inferior right cerebellum 12/03/2017   left side weakness in leg   Cyst of left ovary 01/12/2018   Diabetic neuropathy, painful ([place]), on low dose Gabapentin 07/13/2013   DM (diabetes mellitus), type 2, uncontrolled, with renal complications 82/99/3716   DM2 (diabetes mellitus, type 2) ([place])    Dysfunction of left eustachian tube, with pusatile tinnitus 11/24/2017   Dyslipidemia associated with type 2 diabetes mellitus ([place]) 06/25/2016   ESRD (end stage renal disease) on dialysis [facility])    On hemodialysis in July 2019 via [facility] then switched to [facility] in Nov 2019.    ESRD with anemia (HCC)    Fibromyalgia    Gastroparesis due to DM    GERD (gastroesophageal reflux disease)    Headache    Hypertension associated with diabetes ([place]) 02/19/2011   IBS (irritable bowel syndrome)    Left-sided weakness 12/10/2017   .   Leiomyoma of uterus    Pancreatitis    PE (pulmonary thromboembolism) ([place]) 11/04/2019   Pneumonia    Proliferative diabetic retinopathy  ([place]) 09/29/2015   Pronation deformity of both feet 06/14/2014   Reactive depression 12/10/2017   .   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[place]) 02/17/2018   Thromboembolism ([place]) 04/05/2018   Upper airway cough syndrome, with recs to stay off ACE and take Pepcid q hs 11/15/2016    Past Surgical History:  Procedure Laterality Date   A/V FISTULAGRAM Right 03/03/2020   Procedure: Venogram;  Surgeon: Waynetta Sandy, MD;  Location: [employer] CV LAB;  Service: Cardiovascular;  Laterality: Right;   ARTERY REPAIR Right 02/17/2018   Procedure: EXPLORATION OF RIGHT BRACHIAL ARTERY;  Surgeon: Conrad Calvin, MD;  Location: [place];  Service: Vascular;  Laterality: Right;   ARTERY REPAIR Right 02/17/2018   Procedure: BRACHIAL ARTERY EXPLORATION AND TRHOMBECTOMY;  Surgeon: Conrad Etna, MD;  Location: [place];  Service: Vascular;  Laterality: Right;   AV FISTULA PLACEMENT Left 05/09/2017   Procedure: INSERTION OF ARTERIOVENOUS (AV) GRAFT ARM (ARTEGRAFT);  Surgeon: Conrad Borden, MD;  Location: [facility] OR;  Service: Vascular;  Laterality: Left;   AV FISTULA PLACEMENT Right 02/17/2018   Procedure: INSERTION OF ARTERIOVENOUS (AV) GORE-TEX GRAFT ARM RIGHT UPPER ARM;  Surgeon: Conrad , MD;  Location: [place];  Service: Vascular;  Laterality: Right;   AV FISTULA PLACEMENT Left 10/10/2019   Procedure: INSERTION OF LEFT ARTERIOVENOUS (AV) ARTEGRAFT GRAFT ARM;  Surgeon: Waynetta Sandy, MD;  Location: [place];  Service: Vascular;  Laterality: Left;   AV FISTULA PLACEMENT Right 03/19/2020   Procedure: INSERTION OF ARTERIOVENOUS (AV) GORE-TEX GRAFT IN RIGHT ARM AND VIABAHN STENT IN RIGHT AXILLARY ARTERY;  Surgeon: Waynetta Sandy, MD;  Location: [place];  Service: Vascular;  Laterality: Right;   [place] Left 08/31/2016   Procedure: BASILIC VEIN TRANSPOSITION FIRST STAGE;  Surgeon:  Conrad Fairview, MD;  Location: [place];  Service: Vascular;  Laterality: Left;   BRONCHIAL WASHINGS  09/17/2021   Procedure: BRONCHIAL WASHINGS;  Surgeon: Collene Gobble, MD;  Location: [facility];  Service: Cardiopulmonary;;   CENTRAL VENOUS CATHETER INSERTION Left 07/24/2018   Procedure: INSERTION CENTRAL LINE ADULT;  Surgeon: Conrad Barry, MD;  Location: [place];  Service: Vascular;  Laterality: Left;   EYE SURGERY     secondary to diabetic retinopathy    FOOT SURGERY Right    t"ook bone out- maybe hammer toe"   INSERTION OF DIALYSIS CATHETER Left 07/24/2018   Procedure: INSERTION OF TUNNELED DIALYSIS CATHETER;  Surgeon: Conrad Forks, MD;  Location: [place];  Service: Vascular;  Laterality: Left;   IR FLUORO GUIDE CV LINE LEFT  12/19/2021   IR FLUORO GUIDE CV LINE LEFT  12/29/2021   IR FLUORO GUIDE CV LINE RIGHT  07/21/2018   IR FLUORO GUIDE CV LINE RIGHT  06/01/2019   IR FLUORO GUIDE CV LINE RIGHT  05/05/2020   IR REMOVAL TUN CV CATH W/O FL  01/23/2021   IR REMOVAL TUN CV CATH W/O FL  12/19/2021   IR US GUIDE VASC ACCESS LEFT  12/19/2021   IR US GUIDE VASC ACCESS RIGHT  07/21/2018   IR US GUIDE VASC ACCESS RIGHT  06/01/2019   LIGATION ARTERIOVENOUS GORTEX GRAFT Right 03/20/2020   Procedure: LIGATION ARM ARTERIOVENOUS GORTEX GRAFT With Patch Angioplasty, Thrombectomy;  Surgeon: Waynetta Sandy, MD;  Location: [place];  Service: Vascular;  Laterality: Right;   LIGATION OF ARTERIOVENOUS  FISTULA Left 05/09/2017   Procedure: LIGATION OF ARTERIOVENOUS  FISTULA;  Surgeon: Conrad Bluewell, MD;  Location: [place];  Service: Vascular;  Laterality: Left;   LOOP RECORDER INSERTION N/A 12/05/2017   Procedure: LOOP RECORDER INSERTION;  Surgeon: Evans Lance, MD;  Location: [place] CV LAB;  Service: Cardiovascular;  Laterality: N/A;   MYOMECTOMY     REMOVAL OF GRAFT Right 02/17/2018   Procedure: REMOVAL OF RIGHT UPPER ARM ARTERIOVENOUS GRAFT;  Surgeon: Conrad Hallam, MD;  Location: [place];  Service:  Vascular;  Laterality: Right;   REVISION OF ARTERIOVENOUS GORETEX GRAFT Left 07/24/2018   Procedure: REDO ARTERIOVENOUS GORETEX GRAFT;  Surgeon: Conrad , MD;  Location: [place];  Service: Vascular;  Laterality: Left;   TEE WITHOUT CARDIOVERSION N/A 12/05/2017   Procedure: TRANSESOPHAGEAL ECHOCARDIOGRAM (TEE);  Surgeon: Sanda Klein, MD;  Location: [place];  Service: Cardiovascular;  Laterality: N/A;   UPPER EXTREMITY VENOGRAPHY Left 07/13/2018   Procedure: UPPER EXTREMITY VENOGRAPHY - Central & Left Arm;  Surgeon: Conrad , MD;  Location: [place] CV LAB;  Service: Cardiovascular;  Laterality: Left;   UPPER EXTREMITY VENOGRAPHY Bilateral 09/10/2019   Procedure: UPPER EXTREMITY VENOGRAPHY;  Surgeon: Waynetta Sandy, MD;  Location: [place] CV LAB;  Service: Cardiovascular;  Laterality: Bilateral;   UTERINE FIBROID SURGERY     VIDEO BRONCHOSCOPY N/A 09/17/2021   Procedure: VIDEO BRONCHOSCOPY WITHOUT FLUORO;  Surgeon: Collene Gobble, MD;  [facility];  Service: Cardiopulmonary;  Laterality: N/A;   VITRECTOMY  Bilateral     Allergies: Ibuprofen, Vancomycin, Dilaudid [hydromorphone hcl], Iodinated contrast media, and Tramadol  Medications: Prior to Admission medications   Medication Sig Start Date End Date Taking? Authorizing Provider  calcium acetate (PHOSLO) 667 MG capsule Take 667 mg by mouth 3 (three) times daily with meals. 08/25/21  Yes [provider]  gabapentin (NEURONTIN) 300 MG capsule Take 1 capsule (300 mg total) by mouth 3 (three) times daily. Take after dialysis. Do not take more than three times weekly. 09/17/21  Yes Collene Gobble, MD  insulin aspart (NOVOLOG FLEXPEN) 100 UNIT/ML FlexPen Inject 6 Units into the skin 3 (three) times daily with meals. Sliding scale 07/16/20  Yes [name], MD  insulin degludec (TRESIBA FLEXTOUCH) 100 UNIT/ML SOPN FlexTouch Pen Inject 0.26 mLs (26 Units total) into the skin at bedtime. 12/26/19   Yes Shamleffer, Melanie Crazier, MD  midodrine (PROAMATINE) 5 MG tablet Take 1 tablet (5 mg total) by mouth See admin instructions. Given at Dialysis if needed 09/17/21  Yes Byrum, Rose Fillers, MD  Continuous Blood Gluc Sensor (FREESTYLE LIBRE 14 DAY SENSOR) MISC 1 patch by Does not apply route every 14 (fourteen) days. 07/10/21   [name], MD  doxycycline (VIBRA-TABS) 100 MG tablet Take 1 tablet (100 mg total) by mouth 2 (two) times daily. Patient not taking: Reported on 12/18/2021 10/28/21   Inda Coke, PA  Insulin Pen Needle 29G X 5MM MISC 1 Device by Does not apply route 4 (four) times daily. 12/26/19   Shamleffer, Melanie Crazier, MD  metroNIDAZOLE (METROGEL) 0.75 % vaginal gel Insert applicatorful hs x 5 nights. Patient not taking: Reported on 12/18/2021 10/28/21   Salvadore Dom, MD  pantoprazole (PROTONIX) 40 MG tablet Take 1 tablet (40 mg total) by mouth daily. Patient not taking: Reported on 12/18/2021 11/05/21   Autry-Lott, Naaman Plummer, DO  predniSONE (DELTASONE) 50 MG tablet One tablet 13 hours, 7 hours and 1 hour prior to procedure. Patient not taking: Reported on 12/18/2021 11/25/21   Theresa Duty, NP     Family History  Problem Relation Age of Onset   Colon polyps Mother    Diabetes Mother    Heart murmur Father        history essentially unknown   Hypertension Father    Diabetes Sister    Kidney failure Sister        kidney transplant   Diabetes Brother    Retinal degeneration Brother    Heart murmur Son    Stroke Paternal Grandmother    Diabetes Sister     Social History   Socioeconomic History   Marital status: Married    Spouse name: Not on file   Number of children: 1   Years of education: college   Highest education level: Not on file  Occupational History   Occupation: customer service rep    Employer: [employer]  Tobacco Use   Smoking status: Never   Smokeless tobacco: Never  Vaping Use   Vaping Use: Never used  Substance and  Sexual Activity   Alcohol use: No    Alcohol/week: 0.0 standard drinks of alcohol   Drug use: No   Sexual activity: Not Currently    Partners: Male    Birth control/protection: I.U.D.  Other Topics Concern   Not on file  Social History Narrative   Patient lives with fiance. She has 1 grown son. Works full-time, she Paramedic for attorney.   Caffeine Use: 2 cups daily; sodas  occasionally   She has 7 grandchildren   Social Determinants of Radio broadcast assistant Strain: Not on file  Food Insecurity: Not on file  Transportation Needs: Not on file  Physical Activity: Not on file  Stress: Not on file  Social Connections: Not on file     Review of Systems: A 12 point ROS discussed and pertinent positives are indicated in the HPI above.  All other systems are negative.  Review of Systems  Constitutional:  Negative for chills and fever.  Respiratory:  Negative for cough and shortness of breath.   Cardiovascular:  Negative for chest pain.  Gastrointestinal:  Negative for abdominal pain, nausea and vomiting.  Musculoskeletal:  Negative for back pain.  Neurological:  Negative for headaches.    Vital Signs: BP 134/69 (BP Location: Right Arm)   Pulse 68   Temp 97.6 F (36.4 C) (Tympanic)   Resp 14   Ht '5\' 9"'$  (1.753 m)   Wt 236 lb (107 kg)   LMP 09/10/2021   SpO2 95%   BMI 34.85 kg/m   Physical Exam Vitals and nursing note reviewed.  Constitutional:      General: [name] not in acute distress. HENT:     Head: Normocephalic.     Mouth/Throat:     Mouth: Mucous membranes are moist.     Pharynx: Oropharynx is clear. No oropharyngeal exudate or posterior oropharyngeal erythema.  Cardiovascular:     Rate and Rhythm: Normal rate and regular rhythm.     Comments: Left chest tunneled HD catheter in place.  No overt signs of infection.  No erythema.  No pus.  Pulmonary:     Effort: Pulmonary effort is normal.     Breath sounds: Normal breath sounds.  Abdominal:      Palpations: Abdomen is soft.  Skin:    General: Skin is warm and dry.  Neurological:     Mental Status: [name] alert and oriented to person, place, and time.  Psychiatric:        Mood and Affect: Mood normal.        Behavior: Behavior normal.        Thought Content: Thought content normal.        Judgment: Judgment normal.      MD Evaluation Airway: WNL Heart: WNL Abdomen: WNL Chest/ Lungs: WNL ASA  Classification: 3 Mallampati/Airway Score: Two   Imaging: ECHO TEE  Result Date: 09/30/2022    TRANSESOPHOGEAL ECHO REPORT   Patient Name:   Heather [name] Date of Exam: 09/30/2022 Medical Rec #:  [id number]            Height:       69.0 in Accession #:    [id number]           Weight:       236.0 lb Date of Birth:  12-Apr-1971           BSA:          2.217 m Patient Age:    56 years             BP:           88/49 mmHg Patient Gender: F                    HR:           86 bpm. Exam Location:  Inpatient Procedure: 3D Echo, Transesophageal Echo, Cardiac Doppler and Color Doppler Indications:  I34.8 Other nonrheumatic mitral valve disorders  History:         Patient has prior history of Echocardiogram examinations, most                  recent 05/06/2020. Stroke, Mitral Valve Disease,                  Signs/Symptoms:Shortness of Breath; Risk Factors:Diabetes,                  Hypertension and Dyslipidemia.  Sonographer:     Greer Pickerel Sonographer#2:   Roseanna Rainbow RDCS Referring Phys:  Jerline Pain Diagnosing Phys: Candee Furbish MD PROCEDURE: After discussion of the risks and benefits of a TEE, an informed consent was obtained from the patient. The transesophogeal probe was passed without difficulty through the esophogus of the patient. Imaged were obtained with the patient in a left lateral decubitus position. Sedation performed by different physician. The patient's vital signs; including heart rate, blood pressure, and oxygen saturation; remained stable throughout the procedure. The patient  developed no complications during the procedure. IMPRESSIONS  1. Large vegetation on the mitral valve.  2. Left ventricular ejection fraction, by estimation, is 60 to 65%. The left ventricle has normal function.  3. Right ventricular systolic function is normal. The right ventricular size is normal.  4. Left atrial size was mildly dilated. No left atrial/left atrial appendage thrombus was detected.  5. Right atrial size was mildly dilated.  6. A small pericardial effusion is present. The pericardial effusion is circumferential. There is no evidence of cardiac tamponade.  7. 2.4 x 0.4 cm vegetation attached below posterior leaflet. The mitral valve is degenerative. Mild mitral valve regurgitation. No evidence of mitral stenosis. Moderate mitral annular calcification.  8. The aortic valve is normal in structure. Aortic valve regurgitation is not visualized.  9. Thrombus material on dialysis catheter leaflet tip in the right atrium (chronic seen on prior study). Conclusion(s)/Recommendation(s): Findings are concerning for vegetation/infective endocarditis as detailed above. Admit to hospital for ID/CT surgery consultation. FINDINGS  Left Ventricle: Left ventricular ejection fraction, by estimation, is 60 to 65%. The left ventricle has normal function. The left ventricular internal cavity size was normal in size. Right Ventricle: The right ventricular size is normal. No increase in right ventricular wall thickness. Right ventricular systolic function is normal. Left Atrium: Left atrial size was mildly dilated. No left atrial/left atrial appendage thrombus was detected. Right Atrium: Right atrial size was mildly dilated. Pericardium: A small pericardial effusion is present. The pericardial effusion is circumferential. There is no evidence of cardiac tamponade. Mitral Valve: 2.4 x 0.4 cm vegetation attached below posterior leaflet. The mitral valve is degenerative in appearance. Moderate mitral annular calcification. A  large vegetation is seen on the posterior mitral leaflet. The MV vegetation measures 24 mm x 4 mm. Mild mitral valve regurgitation. No evidence of mitral valve stenosis. MV peak gradient, 6.0 mmHg. The mean mitral valve gradient is 3.0 mmHg. Tricuspid Valve: The tricuspid valve is normal in structure. Tricuspid valve regurgitation is mild. Aortic Valve: The aortic valve is normal in structure. Aortic valve regurgitation is not visualized. Pulmonic Valve: The pulmonic valve was normal in structure. Pulmonic valve regurgitation is trivial. Aorta: The aortic root is normal in size and structure. IAS/Shunts: No atrial level shunt detected by color flow Doppler.  MITRAL VALVE              TRICUSPID VALVE MV Peak grad: 6.0 mmHg  TR Peak grad:   25.4 mmHg MV Mean grad: 3.0 mmHg    TR Vmax:        252.00 cm/s MV Vmax:      1.22 m/s MV Vmean:     86.2 cm/s MR Peak grad: 89.5 mmHg MR Mean grad: 54.0 mmHg MR Vmax:      473.00 cm/s MR Vmean:     345.0 cm/s Candee Furbish MD Electronically signed by Candee Furbish MD Signature Date/Time: 09/30/2022/2:14:15 PM    Final    DG Chest Portable 1 View  Result Date: 09/21/2022 CLINICAL DATA:  Chest pain and feeling sick EXAM: PORTABLE CHEST 1 VIEW COMPARISON:  Radiographs 08/18/2022 FINDINGS: Unchanged left IJ dialysis catheter tip in the right atrium. Stable cardiomegaly. Stable vascular congestion. No focal opacity, pleural effusion, or pneumothorax. Loop recorder. Right axillary stent. IMPRESSION: No change from 08/18/2022. Cardiomegaly and pulmonary vascular congestion. Electronically Signed   By: Placido Sou M.D.   On: 09/21/2022 22:18   ECHOCARDIOGRAM LIMITED  Result Date: 09/14/2022    ECHOCARDIOGRAM LIMITED REPORT   Patient Name:   Heather [name] Date of Exam: 09/14/2022 Medical Rec #:  [id number]            Height:       69.0 in Accession #:    [id number]           Weight:       217.0 lb Date of Birth:  Jun 20, 1971           BSA:          2.139 m Patient Age:    33  years             BP:           166/100 mmHg Patient Gender: F                    HR:           93 bpm. Exam Location:  [address] Procedure: Limited Echo, Cardiac Doppler and Limited Color Doppler Indications:    I51.89 Right atrial Mass  History:        Patient has prior history of Echocardiogram examinations, most                 recent 04/27/2022. Stroke and CKD/ PE/DVT; Risk Factors:Diabetes.                 Pulmonary embolus.  Sonographer:    Marygrace Drought RCS Referring Phys: [place]  1. There is a small mobile mass attached to the mitral valve (0.9 x 0.6 cm), possibly attached to the calcified posterior MV annulus. The lesion is mobile and calcified. Would recommend TEE for further evaluation. The mitral valve is degenerative. No evidence of mitral valve regurgitation. No evidence of mitral stenosis.  2. RA not well visualized.  3. Left ventricular ejection fraction, by estimation, is 65 to 70%. The left ventricle has normal function. The left ventricle has no regional wall motion abnormalities. There is moderate concentric left ventricular hypertrophy. Indeterminate diastolic filling due to E-A fusion.  4. Right ventricular systolic function is normal. The right ventricular size is normal.  5. A small pericardial effusion is present. The pericardial effusion is posterior to the left ventricle.  6. The aortic valve is tricuspid. There is mild calcification of the aortic valve. There is mild thickening of the aortic valve. Aortic valve regurgitation is not visualized. Aortic valve sclerosis is  present, with no evidence of aortic valve stenosis. FINDINGS  Left Ventricle: Left ventricular ejection fraction, by estimation, is 65 to 70%. The left ventricle has normal function. The left ventricle has no regional wall motion abnormalities. The left ventricular internal cavity size was normal in size. There is  moderate concentric left ventricular hypertrophy. Indeterminate diastolic filling  due to E-A fusion. Right Ventricle: The right ventricular size is normal. No increase in right ventricular wall thickness. Right ventricular systolic function is normal. Left Atrium: Left atrial size was normal in size. Right Atrium: Right atrial size was not well visualized. Pericardium: A small pericardial effusion is present. The pericardial effusion is posterior to the left ventricle. Presence of epicardial fat layer. Mitral Valve: There is a small mobile mass attached to the mitral valve (0.9 x 0.6 cm), possibly attached to the calcified posterior MV annulus. The lesion is mobile and calcified. Would recommend TEE for further evaluation. The mitral valve is degenerative in appearance. Mild to moderate mitral annular calcification. No evidence of mitral valve stenosis. Aortic Valve: The aortic valve is tricuspid. There is mild calcification of the aortic valve. There is mild thickening of the aortic valve. Aortic valve regurgitation is not visualized. Aortic valve sclerosis is present, with no evidence of aortic valve stenosis. Pulmonic Valve: The pulmonic valve was grossly normal. Pulmonic valve regurgitation is not visualized. No evidence of pulmonic stenosis. Aorta: The aortic root and ascending aorta are structurally normal, with no evidence of dilitation. LEFT VENTRICLE PLAX 2D LVIDd:         3.30 cm LVIDs:         2.40 cm LV PW:         1.10 cm LV IVS:        1.50 cm LVOT diam:     2.00 cm LVOT Area:     3.14 cm  RIGHT VENTRICLE RV Basal diam:  3.30 cm RVSP:           18.8 mmHg LEFT ATRIUM         Index       RIGHT ATRIUM           Index LA diam:    3.90 cm 1.82 cm/m  RA Pressure: 3.00 mmHg                                 RA Area:     13.10 cm                                 RA Volume:   29.90 ml  13.98 ml/m   AORTA Ao Root diam: 2.80 cm Ao Asc diam:  3.00 cm MITRAL VALVE                TRICUSPID VALVE MV Area (PHT):              TR Peak grad:   15.8 mmHg MV Decel Time:              TR Vmax:         199.00 cm/s MV E velocity: 113.00 cm/s  Estimated RAP:  3.00 mmHg MV A velocity: 158.00 cm/s  RVSP:           18.8 mmHg MV E/A ratio:  0.72  SHUNTS                             Systemic Diam: 2.00 cm Eleonore Chiquito MD Electronically signed by Eleonore Chiquito MD Signature Date/Time: 09/14/2022/4:22:29 PM    Final     Labs:  CBC: Recent Labs    12/29/21 0231 02/03/22 1001 09/21/22 2240 09/30/22 1110 09/30/22 2023  WBC 6.6 8.3 7.2  --  9.4  HGB 9.8* 11.6* 12.8 16.0* 12.8  HCT 33.0* 37.2 42.0 47.0* 41.2  PLT 225 164 261  --  134*     COAGS: No results for input(s): "INR", "APTT" in the last 8760 hours.  BMP: Recent Labs    12/30/21 0337 02/03/22 1001 09/21/22 2240 09/30/22 1110 09/30/22 2023  NA 136 136 140 142 139  K 4.5 3.7 5.3* 3.9 3.3*  CL 103 96* 102 97* 96*  CO2 '22 27 24  '$ --  27  GLUCOSE 83 109* 155* 154* 244*  BUN 31* 27* 46* 28* 29*  CALCIUM 8.3* 8.6* 9.0  --  9.6  CREATININE 9.61* 7.88* 13.58* 8.00* 8.92*  GFRNONAA 4* 6* 3*  --  5*     LIVER FUNCTION TESTS: Recent Labs    10/16/21 1703 11/05/21 1034 12/19/21 0309 12/28/21 1146 09/30/22 2023  BILITOT 0.4 0.6  --  0.9 0.7  AST 24 30  --  20 15  ALT 26 46*  --  11 12  ALKPHOS 107 98  --  71 98  PROT 8.7* 7.9  --  7.7 8.1  ALBUMIN 3.4* 4.0 3.2* 3.4* 3.4*     TUMOR MARKERS: No results for input(s): "AFPTM", "CEA", "CA199", "CHROMGRNA" in the last 8760 hours.  Assessment and Plan:  51 y/o F with history of ESRD on HD (T/Th/S) who presented to [facility] ED yesterday with chills.  Concern for endocarditis, TDC thrombus.  IR consulted for removal for line holiday in the setting of suspected bacteremia.  Plan also made for replacement early next week.   Plan discussed with patient in Radiology at the time of line removal.  [name] understanding of the goals of the procedure and is agreeable to removal today and replacement as able.   NPO p MN Monday.   Risks and benefits discussed with  the patient including, but not limited to bleeding, infection, vascular injury, pneumothorax which may require chest tube placement, air embolism or even death.  All of the patient's questions were answered, patient is agreeable to proceed.  Consent signed and in chart.  Thank you for this interesting consult.  I greatly enjoyed meeting NYEEMA WANT and look forward to participating in their care.  A copy of this report was sent to the requesting provider on this date.  Electronically Signed: Docia Barrier, PA 10/01/2022, 5:11 PM   I spent a total of 20 Minutes in face to face in clinical consultation, greater than 50% of which was counseling/coordinating care for end stage renal disease.

## 2022-10-01 NOTE — Progress Notes (Signed)
PROGRESS NOTE    Heather [name]  WGN:[id number] DOB: 1971-01-04 DOA: 09/30/2022 PCP: [name], MD    No chief complaint on file.   Brief Narrative:    Heather [name] is a 51 y.o. 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   Patient has had chronic dry cough for about 1 year, has had multiple rounds of OTC medications as well as antibiotics with no significant improvement.  She also went to see pulmonology recently and had a normal lung function test.  [name] a non-smoker and husband only smokes outside of the house.  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  In May, echocardiogram found she had a small " clot" sitting on the tip of the HD catheter in the right atrium, for which she was evaluated by CT surgery and recommended conservative management with continuing Eliquis but no surgical intervention.  She remained asymptomatic and on September 19, repeat TTE showed stable right atrium HD catheter tip clot but new suspected vegetation like structure on the left posterior mitral valve measuring at 0.9 x0.6 centimeter, and patient was scheduled for TEE today.  On today's TEE, finding concerning for posterior mitral valve vegetation sizing at 2.4 x 0.4 cm which raised suspicion for endocarditis.  Patient reported cough has been chronic and last week, patient started to have coughing up of light yellowish sputum, denies any fever chills no joint pain no weight loss.  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 -Continue with IV antibiotics, IV daptomycin and cefepime. -IR to DC her [facility] catheter today to allow for line holiday, and will need Next week. -Following repeat blood cultures -CT surgery input greatly appreciated, repeat echo in 2 to 3 weeks to see response to treatment     Chronic cough -No clear etiology.  Symptomatic management. 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, check lupus anticoagulant - continue with Apixaban   ESRD on HD -HD per renal   IDDM -Continue home dosage of Lantus 26 units daily -Sliding scale    DVT prophylaxis:  Code Status: Full Family Communication:  Disposition:   Status is: Inpatient    Consultants:  CT surgery Renal  ID  Subjective:  Any chest pain, fever or shortness of breath  Objective: Vitals:   10/01/22 1100 10/01/22 1130 10/01/22 1200 10/01/22 1230  BP: 95/63 (!) 116/58 109/60 134/69  Pulse: 72   68  Resp:   14   Temp:    97.6 F (36.4 C)  TempSrc:    Tympanic  SpO2:   95%   Weight:      Height:       No intake or output data in the 24 hours ending 10/01/22 1534 Filed Weights   09/30/22 0958  Weight: 107 kg    Examination:  Awake Alert, Oriented X 3, No new F.N deficits, Normal affect Symmetrical Chest wall movement, Good air movement bilaterally, CTAB RRR,No Gallops,Rubs or new Murmurs, No Parasternal Heave +  ve B.Sounds, Abd Soft, No tenderness, No rebound - guarding or rigidity. No Cyanosis, Right BKA    Data Reviewed: I have personally reviewed following labs and imaging studies  CBC: Recent Labs  Lab 09/30/22 1110 09/30/22 2023  WBC  --  9.4  HGB 16.0* 12.8  HCT 47.0* 41.2  MCV  --  101.2*  PLT  --  134*    Basic Metabolic Panel: Recent Labs  Lab 09/30/22 1110 09/30/22 2023  NA 142 139  K 3.9 3.3*  CL 97* 96*  CO2  --  27  GLUCOSE 154* 244*  BUN 28* 29*  CREATININE 8.00* 8.92*   CALCIUM  --  9.6    GFR: Estimated Creatinine Clearance: 9.6 mL/min (A) (by C-G formula based on SCr of 8.92 mg/dL (H)).  Liver Function Tests: Recent Labs  Lab 09/30/22 2023  AST 15  ALT 12  ALKPHOS 98  BILITOT 0.7  PROT 8.1  ALBUMIN 3.4*    CBG: Recent Labs  Lab 09/30/22 1739 09/30/22 2117 10/01/22 1504  GLUCAP 214* 249* 91     Recent Results (from the past 240 hour(s))  Resp Panel by RT-PCR (Flu A&B, Covid) Anterior Nasal Swab     Status: None   Collection Time: 09/21/22  6:13 PM   Specimen: Anterior Nasal Swab  Result Value Ref Range Status   SARS Coronavirus 2 by RT PCR NEGATIVE NEGATIVE Final    Comment: (NOTE) SARS-CoV-2 target nucleic acids are NOT DETECTED.  The SARS-CoV-2 RNA is generally detectable in upper respiratory specimens during the acute phase of infection. The lowest concentration of SARS-CoV-2 viral copies this assay can detect is 138 copies/mL. A negative result does not preclude SARS-Cov-2 infection and should not be used as the sole basis for treatment or other patient management decisions. A negative result may occur with  improper specimen collection/handling, submission of specimen other than nasopharyngeal swab, presence of viral mutation(s) within the areas targeted by this assay, and inadequate number of viral copies(<138 copies/mL). A negative result must be combined with clinical observations, patient history, and epidemiological information. The expected result is Negative.  Fact Sheet for Patients:  EntrepreneurPulse.com.au  Fact Sheet for Healthcare Providers:  IncredibleEmployment.be  This test is no t yet approved or cleared by the [place] FDA and  has been authorized for detection and/or diagnosis of SARS-CoV-2 by FDA under an Emergency Use Authorization (EUA). This EUA will remain  in effect (meaning this test can be used) for the duration of the COVID-19 declaration under  Section 564(b)(1) of the Act, 21 U.S.C.section 360bbb-3(b)(1), unless the authorization is terminated  or revoked sooner.       Influenza A by PCR NEGATIVE NEGATIVE Final   Influenza B by PCR NEGATIVE NEGATIVE Final    Comment: (NOTE) The Xpert Xpress SARS-CoV-2/FLU/RSV plus assay is intended as an aid in the diagnosis of influenza from Nasopharyngeal swab specimens and should not be used as a sole basis for treatment. Nasal washings and aspirates are unacceptable for Xpert Xpress SARS-CoV-2/FLU/RSV testing.  Fact Sheet for Patients: EntrepreneurPulse.com.au  Fact Sheet for Healthcare Providers: IncredibleEmployment.be  This test is not yet approved or cleared by the [place] FDA and has been authorized for detection and/or diagnosis of SARS-CoV-2 by FDA under an Emergency Use Authorization (EUA). This EUA will remain in effect (meaning this test can be used) for the duration of the COVID-19 declaration under Section 564(b)(1) of the Act, 21 U.S.C. section 360bbb-3(b)(1), unless the authorization is terminated  or revoked.  Performed at [facility], [address] [place], [place] [address]   Culture, blood (Routine X 2) w Reflex to ID Panel     Status: None (Preliminary result)   Collection Time: 09/30/22  8:21 PM   Specimen: BLOOD  Result Value Ref Range Status   Specimen Description BLOOD BLOOD RIGHT HAND  Final   Special Requests   Final    BOTTLES DRAWN AEROBIC AND ANAEROBIC Blood Culture adequate volume   Culture   Final    NO GROWTH < 12 HOURS Performed at [facility], [place] [address], [place], [place] [address]    Report Status PENDING  Incomplete  Culture, blood (Routine X 2) w Reflex to ID Panel     Status: None (Preliminary result)   Collection Time: 09/30/22  8:22 PM   Specimen: BLOOD  Result Value Ref Range Status   Specimen Description BLOOD LEFT ANTECUBITAL  Final   Special Requests   Final     BOTTLES DRAWN AEROBIC AND ANAEROBIC Blood Culture adequate volume   Culture   Final    NO GROWTH < 12 HOURS Performed at [facility], [place] [address], [place], [place] [address]    Report Status PENDING  Incomplete         Radiology Studies: ECHO TEE  Result Date: 09/30/2022    TRANSESOPHOGEAL ECHO REPORT   Patient Name:   Heather [name] Date of Exam: 09/30/2022 Medical Rec #:  [id number]            Height:       69.0 in Accession #:    [id number]           Weight:       236.0 lb Date of Birth:  June 25, 1971           BSA:          2.217 m Patient Age:    53 years             BP:           88/49 mmHg Patient Gender: F                    HR:           86 bpm. 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 Stroke, Mitral Valve Disease,                  Signs/Symptoms:Shortness of Breath; Risk Factors:Diabetes,                  Hypertension and Dyslipidemia.  Sonographer:     Greer Pickerel Sonographer#2:   Roseanna Rainbow RDCS Referring Phys:  Jerline Pain Diagnosing Phys: Candee Furbish MD PROCEDURE: After discussion of the risks and benefits of a TEE, an informed consent was obtained from the patient. The transesophogeal probe was passed without difficulty through the esophogus of the patient. Imaged were obtained with the patient in a left lateral decubitus position. Sedation performed by different physician. The patient's vital signs; including heart rate, blood pressure, and oxygen saturation; remained stable throughout the procedure. The patient  developed no complications during the procedure. IMPRESSIONS  1. Large vegetation on the mitral valve.  2. Left ventricular ejection fraction, by estimation, is 60 to 65%. The left ventricle has normal function.  3. Right  ventricular systolic function is normal. The right ventricular size is normal.  4. Left atrial size was mildly dilated. No left atrial/left atrial appendage thrombus was detected.  5. Right atrial size was mildly dilated.  6. A small pericardial effusion is present. The pericardial effusion is circumferential. There is no evidence of cardiac tamponade.  7. 2.4 x 0.4 cm vegetation attached below posterior leaflet. The mitral valve is degenerative. Mild mitral valve regurgitation. No evidence of mitral stenosis. Moderate mitral annular calcification.  8. The aortic valve is normal in structure. Aortic valve regurgitation is not visualized.  9. Thrombus material on dialysis catheter leaflet tip in the right atrium (chronic seen on prior study). Conclusion(s)/Recommendation(s): Findings are concerning for vegetation/infective endocarditis as detailed above. Admit to hospital for ID/CT surgery consultation. FINDINGS  Left Ventricle: Left ventricular ejection fraction, by estimation, is 60 to 65%. The left ventricle has normal function. The left ventricular internal cavity size was normal in size. Right Ventricle: The right ventricular size is normal. No increase in right ventricular wall thickness. Right ventricular systolic function is normal. Left Atrium: Left atrial size was mildly dilated. No left atrial/left atrial appendage thrombus was detected. Right Atrium: Right atrial size was mildly dilated. Pericardium: A small pericardial effusion is present. The pericardial effusion is circumferential. There is no evidence of cardiac tamponade. Mitral Valve: 2.4 x 0.4 cm vegetation attached below posterior leaflet. The mitral valve is degenerative in appearance. Moderate mitral annular calcification. A large vegetation is seen on the posterior mitral leaflet. The MV vegetation measures 24 mm x 4 mm. Mild mitral valve regurgitation. No evidence of mitral valve stenosis. MV peak gradient, 6.0 mmHg. The mean mitral valve  gradient is 3.0 mmHg. Tricuspid Valve: The tricuspid valve is normal in structure. Tricuspid valve regurgitation is mild. Aortic Valve: The aortic valve is normal in structure. Aortic valve regurgitation is not visualized. Pulmonic Valve: The pulmonic valve was normal in structure. Pulmonic valve regurgitation is trivial. Aorta: The aortic root is normal in size and structure. IAS/Shunts: No atrial level shunt detected by color flow Doppler.  MITRAL VALVE              TRICUSPID VALVE MV Peak grad: 6.0 mmHg    TR Peak grad:   25.4 mmHg MV Mean grad: 3.0 mmHg    TR Vmax:        252.00 cm/s MV Vmax:      1.22 m/s MV Vmean:     86.2 cm/s MR Peak grad: 89.5 mmHg MR Mean grad: 54.0 mmHg MR Vmax:      473.00 cm/s MR Vmean:     345.0 cm/s Candee Furbish MD Electronically signed by Candee Furbish MD Signature Date/Time: 09/30/2022/2:14:15 PM    Final         Scheduled Meds:  amLODipine  5 mg Oral Daily   apixaban  5 mg Oral BID   calcium acetate  2,001 mg Oral TID WC   Chlorhexidine Gluconate Cloth  6 each Topical Q0600   doxercalciferol  3 mcg Intravenous Q M,W,F-HD   gabapentin  300 mg Oral Q M,W,F   guaiFENesin  1,200 mg Oral BID   insulin aspart  0-6 Units Subcutaneous TID WC   insulin glargine  26 Units Subcutaneous QHS   irbesartan  150 mg Oral Daily   Continuous Infusions:  ceFEPime (MAXIPIME) IV     DAPTOmycin (CUBICIN) 650 mg in sodium chloride 0.9 % IVPB 650 mg (10/01/22 0127)     LOS: 1 day     Phillips Climes, MD [facility]   To contact the attending provider between 7A-7P or the covering provider during after hours 7P-7A, please log into the web site [email or web address] and access using universal [place] password for that web site. If you do not have the password, please call the hospital operator.  10/01/2022, 3:34 PM

## 2022-10-01 NOTE — Consult Note (Addendum)
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   History of Present Illness:     Mr. 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  She has had several attempts at creating an AV fistula for dialysis over the past few years without success.  She presented to [facility] in January of this year for placement of a HeRO catheter for dialysis but had a PEA arrest at the onset of the procedure so the plan was abandoned.  She has been dialyzed through a tunneled left subclavian catheter for the past 2 years.  She was seen by Dr. Kipp Brood in May of this year for consideration of AngioVAC therapy when she was discovered on echocardiogram to have a small clot on the tip of the dialysis catheter in the right atrium.  After evaluation, Dr. Kipp Brood did not feel that the the benefit of the procedure outweighed the risks.  She was started on Eliquis.  She had a follow-up echocardiogram in September of this year showing a stable clot at the tip of the catheter but also showing a new vegetation on the posterior leaflet of the mitral valve.  On further questioning, the patient did report that she had been having progressive fatigue, chills, and night sweats over the past few months.  She was seen by Dr. Phineas Inches about 2 weeks ago who recommended transesophageal  echo to further delineate the mitral valve vegetation.  The study was conducted yesterday by Dr. Candee Furbish and showed a 2.5 cm x 0.4 cm mobile lesion on the posterior leaflet of the mitral valve.  There was mild mitral insufficiency and no mitral stenosis.  The other valve structures were unremarkable.  There is a small pericardial effusion.  The clot previously noted at the tip of the dialysis catheter in the right atrium was essentially unchanged.  There was no evidence of PFO. Heather [name] was admitted to the hospital.  Infectious disease was consulted and she has been started on empiric daptomycin and cefepime.  Blood cultures are negative at 1 day.  CT surgery has been asked to see Heather [name] for consideration of possible surgical intervention of the mitral valve endocarditis.   Zubrod Score: At the time of surgery this patient's most appropriate activity status/level should be described as: '[]'$     0    Normal activity, no symptoms '[]'$     1    Restricted in physical strenuous activity but ambulatory, able to do out light work '[]'$     2    Ambulatory and capable of self care, unable to do work activities, up and about                 more than 50%  Of the time                            [  x]    3    Only limited self care, in bed greater than 50% of waking hours '[]'$     4    Completely disabled, no self care, confined to bed or chair '[]'$     5    Moribund  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[place]), nonhemorrhagic, inferior right cerebellum 12/03/2017   left side weakness in leg   Cyst of left ovary 01/12/2018   Diabetic neuropathy, painful ([place]), on low dose Gabapentin 07/13/2013   DM (diabetes mellitus), type 2, uncontrolled, with renal complications 69/48/5462   DM2 (diabetes mellitus, type  2) ([place])    Dysfunction of left eustachian tube, with pusatile tinnitus 11/24/2017   Dyslipidemia associated with type 2 diabetes mellitus ([place]) 06/25/2016   ESRD (end stage renal disease) on dialysis [facility])    On hemodialysis in July 2019 via [facility] then switched to [facility] in Nov 2019.    ESRD with anemia ([facility])    Fibromyalgia    Gastroparesis due to DM    GERD (gastroesophageal reflux disease)    Headache    Hypertension associated with diabetes ([place]) 02/19/2011   IBS (irritable bowel syndrome)    Left-sided weakness 12/10/2017   .   Leiomyoma of uterus    Pancreatitis    PE (pulmonary thromboembolism) ([place]) 11/04/2019   Pneumonia    Proliferative diabetic retinopathy ([place]) 09/29/2015   Pronation deformity of both feet 06/14/2014   Reactive depression 12/10/2017   .   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[place]) 02/17/2018   Thromboembolism ([place]) 04/05/2018   Upper airway cough syndrome, with recs to stay off ACE and take Pepcid q hs 11/15/2016    Past Surgical History:  Procedure Laterality Date   A/V FISTULAGRAM Right 03/03/2020   Procedure: Venogram;  Surgeon: Waynetta Sandy, MD;  Location: [facility];  Service: Cardiovascular;  Laterality: Right;   ARTERY REPAIR Right 02/17/2018   Procedure: EXPLORATION OF RIGHT BRACHIAL ARTERY;  Surgeon: Conrad Dayton, MD;  Location: [place];  Service: Vascular;  Laterality: Right;   ARTERY REPAIR Right 02/17/2018   Procedure: BRACHIAL ARTERY EXPLORATION AND TRHOMBECTOMY;  Surgeon: Conrad T[place]oc, MD;  Location: [place];  Service: Vascular;  Laterality: Right;   AV FISTULA PLACEMENT Left 05/09/2017   Procedure: INSERTION OF ARTERIOVENOUS (AV) GRAFT ARM (ARTEGRAFT);  Surgeon: Conrad Crescent, MD;  Location[facility] OR;  Service: Vascular;  Laterality: Left;   AV FISTULA PLACEMENT Right 02/17/2018   Procedure: INSERTION OF ARTERIOVENOUS (AV) GORE-TEX  GRAFT ARM RIGHT UPPER ARM;  Surgeon: Conrad [place] Lake, MD;  Location: [place];  Service: Vascular;  Laterality: Right;   AV FISTULA PLACEMENT Left 10/10/2019   Procedure: INSERTION OF LEFT ARTERIOVENOUS (AV) ARTEGRAFT GRAFT ARM;  Surgeon: Waynetta Sandy, MD;  Location: [place];  Service: Vascular;  Laterality: Left;   AV FISTULA PLACEMENT Right 03/19/2020   Procedure: INSERTION OF ARTERIOVENOUS (AV) GORE-TEX GRAFT IN RIGHT ARM AND VIABAHN STENT IN RIGHT AXILLARY ARTERY;  Surgeon: Waynetta Sandy, MD;  Location: [place];  Service: Vascular;  Laterality: Right;   [place] Left 08/31/2016   Procedure: BASILIC VEIN TRANSPOSITION FIRST STAGE;  Surgeon: Conrad Danvers, MD;  Location: [facility]  OR;  Service: Vascular;  Laterality: Left;   BRONCHIAL WASHINGS  09/17/2021   Procedure: BRONCHIAL WASHINGS;  Surgeon: Collene Gobble, MD;  Location: [facility];  Service: Cardiopulmonary;;   CENTRAL VENOUS CATHETER INSERTION Left 07/24/2018   Procedure: INSERTION CENTRAL LINE ADULT;  Surgeon: Conrad Colville, MD;  Location: [place];  Service: Vascular;  Laterality: Left;   EYE SURGERY     secondary to diabetic retinopathy    FOOT SURGERY Right    t"ook bone out- maybe hammer toe"   INSERTION OF DIALYSIS CATHETER Left 07/24/2018   Procedure: INSERTION OF TUNNELED DIALYSIS CATHETER;  Surgeon: Conrad [place], MD;  Location: [place];  Service: Vascular;  Laterality: Left;   IR FLUORO GUIDE CV LINE LEFT  12/19/2021   IR FLUORO GUIDE CV LINE LEFT  12/29/2021   IR FLUORO GUIDE CV LINE RIGHT  07/21/2018   IR FLUORO GUIDE CV LINE RIGHT  06/01/2019   IR FLUORO GUIDE CV LINE RIGHT  05/05/2020   IR REMOVAL TUN CV CATH W/O FL  01/23/2021   IR REMOVAL TUN CV CATH W/O FL  12/19/2021   IR US GUIDE VASC ACCESS LEFT  12/19/2021   IR US GUIDE VASC ACCESS RIGHT  07/21/2018   IR US GUIDE VASC ACCESS RIGHT  06/01/2019   LIGATION ARTERIOVENOUS GORTEX GRAFT Right 03/20/2020   Procedure: LIGATION ARM ARTERIOVENOUS GORTEX GRAFT  With Patch Angioplasty, Thrombectomy;  Surgeon: Waynetta Sandy, MD;  Location: [place];  Service: Vascular;  Laterality: Right;   LIGATION OF ARTERIOVENOUS  FISTULA Left 05/09/2017   Procedure: LIGATION OF ARTERIOVENOUS  FISTULA;  Surgeon: Conrad Duane Lake, MD;  Location: [place];  Service: Vascular;  Laterality: Left;   LOOP RECORDER INSERTION N/A 12/05/2017   Procedure: LOOP RECORDER INSERTION;  Surgeon: Evans Lance, MD;  Location: [place] [facility];  Service: Cardiovascular;  Laterality: N/A;   MYOMECTOMY     REMOVAL OF GRAFT Right 02/17/2018   Procedure: REMOVAL OF RIGHT UPPER ARM ARTERIOVENOUS GRAFT;  Surgeon: Conrad [place], MD;  Location: [place];  Service: Vascular;  Laterality: Right;   REVISION OF ARTERIOVENOUS GORETEX GRAFT Left 07/24/2018   Procedure: REDO ARTERIOVENOUS GORETEX GRAFT;  Surgeon: Conrad Storden, MD;  Location: [place];  Service: Vascular;  Laterality: Left;   TEE WITHOUT CARDIOVERSION N/A 12/05/2017   Procedure: TRANSESOPHAGEAL ECHOCARDIOGRAM (TEE);  Surgeon: Sanda Klein, MD;  Location: [place];  Service: Cardiovascular;  Laterality: N/A;   UPPER EXTREMITY VENOGRAPHY Left 07/13/2018   Procedure: UPPER EXTREMITY VENOGRAPHY - Central & Left Arm;  Surgeon: Conrad [place], MD;  Location: [place] [facility];  Service: Cardiovascular;  Laterality: Left;   UPPER EXTREMITY VENOGRAPHY Bilateral 09/10/2019   Procedure: UPPER EXTREMITY VENOGRAPHY;  Surgeon: Waynetta Sandy, MD;  Location: [place] [facility];  Service: Cardiovascular;  Laterality: Bilateral;   UTERINE FIBROID SURGERY     VIDEO BRONCHOSCOPY N/A 09/17/2021   Procedure: VIDEO BRONCHOSCOPY WITHOUT FLUORO;  Surgeon: Collene Gobble, MD;  [facility];  Service: Cardiopulmonary;  Laterality: N/A;   VITRECTOMY Bilateral     Social History   Tobacco Use  Smoking Status Never  Smokeless Tobacco Never    Social History   Substance and Sexual Activity  Alcohol Use No   Alcohol/week: 0.0  standard drinks of alcohol     Allergies  Allergen Reactions   Ibuprofen Other (See Comments)    CKD stage 3. Should avoid.   Vancomycin Nausea And Vomiting  Severe N/V - avoid if possible   Dilaudid [Hydromorphone Hcl] Itching and Other (See Comments)    Can take with Benadryl.   Iodinated Contrast Media Rash   Tramadol Itching    Current Facility-Administered Medications  Medication Dose Route Frequency Provider Last Rate Last Admin   acetaminophen (TYLENOL) tablet 650 mg  650 mg Oral Q6H PRN Opyd, Ilene Qua, MD   650 mg at 10/01/22 1056   amLODipine (NORVASC) tablet 5 mg  5 mg Oral Daily Wynetta Fines T, MD       apixaban Arne [place]) tablet 5 mg  5 mg Oral BID Opyd, Ilene Qua, MD   5 mg at 09/30/22 2040   benzonatate (TESSALON) capsule 100 mg  100 mg Oral TID PRN Wynetta Fines T, MD   100 mg at 09/30/22 2039   calcium acetate (PHOSLO) capsule 2,001 mg  2,001 mg Oral TID WC Wynetta Fines T, MD       ceFEPIme (MAXIPIME) 1 g in sodium chloride 0.9 % 100 mL IVPB  1 g Intravenous Q24H Pham, Minh Q, RPH-CPP       Chlorhexidine Gluconate Cloth 2 % PADS 6 each  6 each Topical Q0600 Gean Quint, MD   6 each at 10/01/22 7510   DAPTOmycin (CUBICIN) 650 mg in sodium chloride 0.9 % IVPB  8 mg/kg (Adjusted) Intravenous Q48H Pham, Minh Q, RPH-CPP 126 mL/hr at 10/01/22 0127 650 mg at 10/01/22 0127   doxercalciferol (HECTOROL) injection 3 mcg  3 mcg Intravenous Q M,W,F-HD Gean Quint, MD       gabapentin (NEURONTIN) capsule 300 mg  300 mg Oral Q M,W,F Wynetta Fines T, MD       guaiFENesin [facility]) 12 hr tablet 1,200 mg  1,200 mg Oral BID Wynetta Fines T, MD   1,200 mg at 09/30/22 2039   insulin aspart (novoLOG) injection 0-6 Units  0-6 Units Subcutaneous TID WC Lequita Halt, MD       insulin glargine (LANTUS) injection 26 Units  26 Units Subcutaneous QHS Wynetta Fines T, MD   26 Units at 10/01/22 0125   irbesartan (AVAPRO) tablet 150 mg  150 mg Oral Daily Wynetta Fines T, MD       ondansetron [facility])  injection 4 mg  4 mg Intravenous Q6H PRN Opyd, Ilene Qua, MD   4 mg at 09/30/22 2109   oxyCODONE (Oxy IR/ROXICODONE) immediate release tablet 5 mg  5 mg Oral Once PRN Opyd, Ilene Qua, MD        Medications Prior to Admission  Medication Sig Dispense Refill Last Dose   amLODipine-olmesartan (AZOR) 5-20 MG tablet TAKE 1 TABLET BY MOUTH EVERY DAY 90 tablet 1 09/30/2022 at 0800   calcium acetate (PHOSLO) 667 MG capsule Take 2,001 mg by mouth 3 (three) times daily with meals.   09/29/2022   Continuous Blood Gluc Sensor (FREESTYLE LIBRE 14 DAY SENSOR) MISC 1 patch by Does not apply route every 14 (fourteen) days. 6 each 5 09/30/2022   Doxercalciferol (HECTOROL IV) Doxercalciferol (Hectorol)   09/29/2022   gabapentin (NEURONTIN) 300 MG capsule TAKE 1 CAPSULE (300 MG TOTAL) BY MOUTH DAILY AS NEEDED. TAKE AFTER DIALYSIS. DO NOT TAKE MORE THAN THREE TIMES WEEKLY. (Patient taking differently: Take 300 mg by mouth every Monday, Wednesday, and Friday. At 10:30 am) 90 capsule 2 09/29/2022   insulin aspart (NOVOLOG FLEXPEN) 100 UNIT/ML FlexPen Inject 6 Units into the skin 3 (three) times daily with meals. Sliding scale 15 mL  09/29/2022   insulin degludec (  TRESIBA FLEXTOUCH) 100 UNIT/ML FlexTouch Pen Inject 26 Units into the skin at bedtime. 24 mL 3 Past Month   ketoconazole (NIZORAL) 2 % cream Apply 1 Application topically daily.   09/29/2022   Methoxy PEG-Epoetin Beta (MIRCERA IJ) Mircera   09/29/2022   clindamycin (CLEOCIN T) 1 % SWAB Apply 1 Application topically.      ELIQUIS 5 MG TABS tablet Take 5 mg by mouth daily.   More than a month   fluticasone (FLONASE) 50 MCG/ACT nasal spray Place 2 sprays into both nostrils daily. (Patient not taking: Reported on 09/23/2022) 18.2 mL 2 Not Taking   Insulin Pen Needle 29G X 5MM MISC 1 Device by Does not apply route 4 (four) times daily. 400 each 3     Family History  Problem Relation Age of Onset   Colon polyps Mother    Diabetes Mother    Heart murmur Father         history essentially unknown   Hypertension Father    Diabetes Sister    Kidney failure Sister        kidney transplant   Diabetes Brother    Retinal degeneration Brother    Heart murmur Son    Stroke Paternal Grandmother    Diabetes Sister      Review of Systems:     Cardiac Review of Systems: Y or  [    ]= no  Chest Pain [    ]  Resting SOB [   ] Exertional SOB  [ x]  Orthopnea [  ]   Pedal Edema [   ]    Palpitations [  ] Syncope  [  ]   Presyncope [   ]  General Review of Systems: [Y] = yes [  ]=no Constitional: recent weight change [ She thinks she has lost weight recently ]; anorexia [  ]; fatigue [  ]; nausea [  ]; night sweats [ x ]; fever [  ]; or chills [  x]                                                               Dental: Last Dentist visit:   Eye : blurred vision [  ]; diplopia [   ]; vision changes [  ];  Amaurosis fugax[  ]; Resp: cough [ x ];  wheezing[  ];  hemoptysis[  ]; shortness of breath[  ]; paroxysmal nocturnal dyspnea[  ]; dyspnea on exertion[  x]; or orthopnea[  ];  GI:  gallstones[  ], vomiting[  ];  dysphagia[  ]; melena[  ];  hematochezia [  ]; heartburn[  ];   Hx of  Colonoscopy[  ]; GU: kidney stones [  ]; hematuria[  ];   dysuria [  ];  nocturia[  ];  history of     obstruction [  ]; urinary frequency [  ]             Skin: rash, swelling[  ];, hair loss[  ];  peripheral edema[  ];  or itching[  ]; Musculosketetal: myalgias[  ];  joint swelling[  ];  joint erythema[  ];  joint pain[  ];  back pain[  ];  Heme/Lymph: bruising[  ];  bleeding[  ];  anemia[  x ];  Neuro: TIA[  ];  headaches[  ];  stroke[  ];  vertigo[  ];  seizures[  ];   paresthesias[  ];  difficulty walking[ x, s/p right BKA, uses wheel chair for mobility ];  Psych:depression[  ]; anxiety[  ];  Endocrine: diabetes[  x, insulin dependent];  thyroid dysfunction[  ];             Physical Exam: BP (!) 116/58 (BP Location: Right Arm)   Pulse 72   Temp 98.3 F (36.8 C) (Oral)   Resp 18    Ht '5\' 9"'$  (1.753 m)   Wt 107 kg   LMP 09/10/2021   SpO2 95%   BMI 34.85 kg/m    General appearance: alert, cooperative, no distress. Patient was seen in the dialysis unit after completing treatment today.  Head: Normocephalic, without obvious abnormality, atraumatic Neck: no adenopathy, no carotid bruit, no JVD, and supple, symmetrical, trachea midline Resp: Breath sounds are full, equal and clear to auscultation Cardio: RRR, no murmur Extremities: warm, without edema. S/P right BKA. Neurologic: Grossly normal    Recent Radiology Findings:   Diagnostic Studies & Laboratory data:    TRANSESOPHOGEAL ECHO REPORT     Patient Name:   Heather [name] Date of Exam: 09/30/2022  Medical Rec #:  [id number]            Height:       69.0 in  Accession #:    [id number]           Weight:       236.0 lb  Date of Birth:  07-08-1971           BSA:          2.217 m  Patient Age:    82 years             BP:           88/49 mmHg  Patient Gender: F                    HR:           86 bpm.  Exam Location:  Inpatient   Procedure: 3D Echo, Transesophageal Echo, Cardiac Doppler and Color  Doppler   Indications:     I34.8 Other nonrheumatic mitral valve disorders     History:         Patient has prior history of Echocardiogram examinations,  most                   recent 05/06/2020. Stroke, Mitral Valve Disease,                   Signs/Symptoms:Shortness of Breath; Risk  Factors:Diabetes,                   Hypertension and Dyslipidemia.     Sonographer:     Greer Pickerel  Sonographer#2:   Roseanna Rainbow RDCS  Referring Phys:  Jerline Pain  Diagnosing Phys: Candee Furbish MD   PROCEDURE: After discussion of the risks and benefits of a TEE, an  informed consent was obtained from the patient. The transesophogeal probe  was passed without difficulty through the esophogus of the patient. Imaged  were obtained with the patient in a  left lateral decubitus position. Sedation performed by different   physician. The patient's vital signs; including heart rate, blood  pressure, and oxygen saturation; remained stable throughout the procedure.  The  patient developed no complications during  the procedure.   IMPRESSIONS     1. Large vegetation on the mitral valve.   2. Left ventricular ejection fraction, by estimation, is 60 to 65%. The  left ventricle has normal function.   3. Right ventricular systolic function is normal. The right ventricular  size is normal.   4. Left atrial size was mildly dilated. No left atrial/left atrial  appendage thrombus was detected.   5. Right atrial size was mildly dilated.   6. A small pericardial effusion is present. The pericardial effusion is  circumferential. There is no evidence of cardiac tamponade.   7. 2.4 x 0.4 cm vegetation attached below posterior leaflet. The mitral  valve is degenerative. Mild mitral valve regurgitation. No evidence of  mitral stenosis. Moderate mitral annular calcification.   8. The aortic valve is normal in structure. Aortic valve regurgitation is  not visualized.   9. Thrombus material on dialysis catheter leaflet tip in the right atrium  (chronic seen on prior study).   Conclusion(s)/Recommendation(s): Findings are concerning for  vegetation/infective endocarditis as detailed above. Admit to hospital for  ID/CT surgery consultation.   FINDINGS   Left Ventricle: Left ventricular ejection fraction, by estimation, is 60  to 65%. The left ventricle has normal function. The left ventricular  internal cavity size was normal in size.   Right Ventricle: The right ventricular size is normal. No increase in  right ventricular wall thickness. Right ventricular systolic function is  normal.   Left Atrium: Left atrial size was mildly dilated. No left atrial/left  atrial appendage thrombus was detected.   Right Atrium: Right atrial size was mildly dilated.   Pericardium: A small pericardial effusion is present. The  pericardial  effusion is circumferential. There is no evidence of cardiac tamponade.   Mitral Valve: 2.4 x 0.4 cm vegetation attached below posterior leaflet.  The mitral valve is degenerative in appearance. Moderate mitral annular  calcification. A large vegetation is seen on the posterior mitral leaflet.  The MV vegetation measures 24 mm x  4 mm. Mild mitral valve regurgitation. No evidence of mitral valve  stenosis. MV peak gradient, 6.0 mmHg. The mean mitral valve gradient is  3.0 mmHg.   Tricuspid Valve: The tricuspid valve is normal in structure. Tricuspid  valve regurgitation is mild.   Aortic Valve: The aortic valve is normal in structure. Aortic valve  regurgitation is not visualized.   Pulmonic Valve: The pulmonic valve was normal in structure. Pulmonic valve  regurgitation is trivial.   Aorta: The aortic root is normal in size and structure.   IAS/Shunts: No atrial level shunt detected by color flow Doppler.      MITRAL VALVE              TRICUSPID VALVE  MV Peak grad: 6.0 mmHg    TR Peak grad:   25.4 mmHg  MV Mean grad: 3.0 mmHg    TR Vmax:        252.00 cm/s  MV Vmax:      1.22 m/s  MV Vmean:     86.2 cm/s  MR Peak grad: 89.5 mmHg  MR Mean grad: 54.0 mmHg  MR Vmax:      473.00 cm/s  MR Vmean:     345.0 cm/s   Candee Furbish MD  Electronically signed by Candee Furbish MD  Signature Date/Time: 09/30/2022/2:14:15 PM       I have independently reviewed the above radiologic studies and discussed with the  patient   Recent Lab Findings: Lab Results  Component Value Date   WBC 9.4 09/30/2022   HGB 12.8 09/30/2022   HCT 41.2 09/30/2022   PLT 134 (L) 09/30/2022   GLUCOSE 244 (H) 09/30/2022   CHOL 249 (H) 12/03/2017   TRIG 158 (H) 12/03/2017   HDL 48 12/03/2017   LDLDIRECT 120.0 04/17/2015   LDLCALC 169 (H) 12/03/2017   ALT 12 09/30/2022   AST 15 09/30/2022   NA 139 09/30/2022   K 3.3 (L) 09/30/2022   CL 96 (L) 09/30/2022   CREATININE 8.92 (H) 09/30/2022   BUN  29 (H) 09/30/2022   CO2 27 09/30/2022   TSH 1.11 03/19/2019   INR 1.0 03/19/2020   HGBA1C 7.6 (H) 09/30/2022      Assessment / Plan:      -Dr. Tenny Craw has reviewed the transesophageal echo and does not feel that surgical intervention is indicated for the suspected mitral valve endocarditis or for the "clot" previously seen in May at the dialysis dialysis catheter tip in the right atrium.  Heather [name] is currently being treated with  broad-spectrum empiric antibiotic coverage.  Final results of blood cultures are pending.  The interventional radiology service has been asked to evaluate for possible removal of the existing hemodialysis catheter for a "catheter holiday" and eventual replacement with a new one next week. Recommend repeating the echo in 2-3 weeks to assure appropriate response to medical management.    I  spent 20 minutes counseling the patient face to face.   Antony Odea, PA-C  10/01/2022 12:03 PM   Patient seen and examined. Results reviewed. All questions answered.   Discussed with her at bedside along with [place] PA.   There is a very mobile posterior leaflet echocardiographic finding. This is NOT associated with a severe valve lesion. This patient has multiple severe co-morbidites including ESRD, DM, hx of Right BKA.  Will follow. Awaiting blood cultures Appreciate ID assistance. Repeat echo 1-2 weeks from last echo.   Justice Rocher MD CV Surgery

## 2022-10-02 DIAGNOSIS — I339 Acute and subacute endocarditis, unspecified: Secondary | ICD-10-CM | POA: Diagnosis not present

## 2022-10-02 LAB — CBC
HCT: 36.9 % (ref 36.0–46.0)
Hemoglobin: 11.1 g/dL — ABNORMAL LOW (ref 12.0–15.0)
MCH: 30.5 pg (ref 26.0–34.0)
MCHC: 30.1 g/dL (ref 30.0–36.0)
MCV: 101.4 fL — ABNORMAL HIGH (ref 80.0–100.0)
Platelets: 160 10*3/uL (ref 150–400)
RBC: 3.64 MIL/uL — ABNORMAL LOW (ref 3.87–5.11)
RDW: 16.5 % — ABNORMAL HIGH (ref 11.5–15.5)
WBC: 7.8 10*3/uL (ref 4.0–10.5)
nRBC: 0 % (ref 0.0–0.2)

## 2022-10-02 LAB — EXTRACTABLE NUCLEAR ANTIGEN ANTIBODY
ENA SM Ab Ser-aCnc: <0.2 AI (ref 0.0–0.9)
Ribonucleic Protein: <0.2 AI (ref 0.0–0.9)
SSA (Ro) (ENA) Antibody, IgG: 0.2 AI (ref 0.0–0.9)
SSB (La) (ENA) Antibody, IgG: <0.2 AI (ref 0.0–0.9)
Scleroderma (Scl-70) (ENA) Antibody, IgG: <0.2 AI (ref 0.0–0.9)
ds DNA Ab: 1 IU/mL (ref 0–9)

## 2022-10-02 LAB — BASIC METABOLIC PANEL
Anion gap: 13 (ref 5–15)
BUN: 19 mg/dL (ref 6–20)
CO2: 26 mmol/L (ref 22–32)
Calcium: 9.4 mg/dL (ref 8.9–10.3)
Chloride: 96 mmol/L — ABNORMAL LOW (ref 98–111)
Creatinine, Ser: 6.61 mg/dL — ABNORMAL HIGH (ref 0.44–1.00)
GFR, Estimated: 7 mL/min — ABNORMAL LOW (ref >60)
Glucose, Bld: 93 mg/dL (ref 70–99)
Potassium: 3.4 mmol/L — ABNORMAL LOW (ref 3.5–5.1)
Sodium: 135 mmol/L (ref 135–145)

## 2022-10-02 LAB — LUPUS ANTICOAGULANT PANEL
DRVVT: 56.6 s — ABNORMAL HIGH (ref 0.0–47.0)
PTT Lupus Anticoagulant: 31.9 s (ref 0.0–43.5)

## 2022-10-02 LAB — GLUCOSE, CAPILLARY
Glucose-Capillary: 147 mg/dL — ABNORMAL HIGH (ref 70–99)
Glucose-Capillary: 85 mg/dL (ref 70–99)
Glucose-Capillary: 138 mg/dL — ABNORMAL HIGH (ref 70–99)

## 2022-10-02 LAB — HEPATITIS B SURFACE ANTIBODY, QUANTITATIVE: Hep B S AB Quant (Post): 565 m[IU]/mL (ref >9.9)

## 2022-10-02 LAB — DRVVT CONFIRM: dRVVT Confirm: 1.2 ratio (ref 0.8–1.2)

## 2022-10-02 LAB — DRVVT MIX: dRVVT Mix: 48.1 s — ABNORMAL HIGH (ref 0.0–40.4)

## 2022-10-02 NOTE — Progress Notes (Signed)
[facility] Progress Note   Subjective:   Patient seen and examined at bedside.  Biggest complaint is dry skin.  Denies fever, chills, n/v/d, CP, SOB, edema and orthopnea. Tolerated dialysis well yesterday.  No issues with TDC removal.   Objective Vitals:   10/01/22 1600 10/01/22 1936 10/01/22 2326 10/02/22 0339  BP: 133/78 (!) 124/58 (!) 142/69 129/66  Pulse: 85 93 89   Resp: '19 17 14 19  '$ Temp:  98.5 F (36.9 C) 98.2 F (36.8 C) 98 F (36.7 C)  TempSrc:  Oral Oral Oral  SpO2: 90% 94% 95% 96%  Weight:      Height:       Physical Exam General:WDWN female in NAD Heart:RRR, no mrg Lungs:CTAB, nml WOB onRA Abdomen:soft, NTND Extremities:no LE edema, R BKA Dialysis Access: none    Filed Weights   09/30/22 0958  Weight: 107 kg    Intake/Output Summary (Last 24 hours) at 10/02/2022 1139 Last data filed at 10/01/2022 1648 Gross per 24 hour  Intake 61.9 ml  Output --  Net 61.9 ml    Additional Objective Labs: Basic Metabolic Panel: Recent Labs  Lab 09/30/22 1110 09/30/22 2023 10/02/22 0327  NA 142 139 135  K 3.9 3.3* 3.4*  CL 97* 96* 96*  CO2  --  27 26  GLUCOSE 154* 244* 93  BUN 28* 29* 19  CREATININE 8.00* 8.92* 6.61*  CALCIUM  --  9.6 9.4   Liver Function Tests: Recent Labs  Lab 09/30/22 2023  AST 15  ALT 12  ALKPHOS 98  BILITOT 0.7  PROT 8.1  ALBUMIN 3.4*    CBC: Recent Labs  Lab 09/30/22 1110 09/30/22 2023 10/02/22 0327  WBC  --  9.4 7.8  HGB 16.0* 12.8 11.1*  HCT 47.0* 41.2 36.9  MCV  --  101.2* 101.4*  PLT  --  134* 160   Blood Culture    Component Value Date/Time   SDES BLOOD LEFT ANTECUBITAL 09/30/2022 2022   SPECREQUEST  09/30/2022 2022    BOTTLES DRAWN AEROBIC AND ANAEROBIC Blood Culture adequate volume   CULT  09/30/2022 2022    NO GROWTH 2 DAYS Performed at [facility], [place] [address], [place], [place] [address]    REPTSTATUS PENDING 09/30/2022 2022    Cardiac Enzymes: Recent Labs  Lab  09/30/22 2023  CKTOTAL 47   CBG: Recent Labs  Lab 09/30/22 1739 09/30/22 2117 10/01/22 1504 10/01/22 2115 10/02/22 0851  GLUCAP 214* 249* 91 166* 147*    Studies/Results: IR Removal Tun Cv Cath W/O FL  Result Date: 10/01/2022 INDICATION: Patient with history of tunneled dialysis catheter last exchanged in IR 12/29/21 by Dr. Kathlene Cote. [name] now admitted with bacteremia and request made for removal for line holiday. EXAM: REMOVAL TUNNELED CENTRAL VENOUS CATHETER MEDICATIONS: 10 mL 1% lidocaine ANESTHESIA/SEDATION: None FLUOROSCOPY TIME:  None COMPLICATIONS: None immediate. PROCEDURE: Informed written consent was obtained from the patient after a thorough discussion of the procedural risks, benefits and alternatives. All questions were addressed. Maximal Sterile Barrier Technique was utilized including caps, mask, sterile gowns, sterile gloves, sterile drape, hand hygiene and skin antiseptic. A timeout was performed prior to the initiation of the procedure. The patient's left chest and catheter was prepped and draped in a normal sterile fashion. Heparin was removed from both ports of catheter. 1% lidocaine was used for local anesthesia. Using gentle blunt dissection the cuff of the catheter was exposed and the catheter was removed in its entirety. Pressure  was held till hemostasis was obtained. A sterile dressing was applied. The patient tolerated the procedure well with no immediate complications. IMPRESSION: Successful catheter removal as described above. Read by: Brynda Greathouse PA-C Electronically Signed   By: Markus Daft M.D.   On: 10/01/2022 17:33   ECHO TEE  Result Date: 09/30/2022    TRANSESOPHOGEAL ECHO REPORT   Patient Name:   Heather [name] Date of Exam: 09/30/2022 Medical Rec #:  [id number]            Height:       69.0 in Accession #:    [id number]           Weight:       236.0 lb Date of Birth:  Sep 05, 1971           BSA:          2.217 m Patient Age:    51 years             BP:            88/49 mmHg Patient Gender: F                    HR:           86 bpm. 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 Stroke, Mitral Valve Disease,                  Signs/Symptoms:Shortness of Breath; Risk Factors:Diabetes,                  Hypertension and Dyslipidemia.  Sonographer:     Greer Pickerel Sonographer#2:   Roseanna Rainbow RDCS Referring Phys:  Jerline Pain Diagnosing Phys: Candee Furbish MD PROCEDURE: After discussion of the risks and benefits of a TEE, an informed consent was obtained from the patient. The transesophogeal probe was passed without difficulty through the esophogus of the patient. Imaged were obtained with the patient in a left lateral decubitus position. Sedation performed by different physician. The patient's vital signs; including heart rate, blood pressure, and oxygen saturation; remained stable throughout the procedure. The patient developed no complications during the procedure. IMPRESSIONS  1. Large vegetation on the mitral valve.  2. Left ventricular ejection fraction, by estimation, is 60 to 65%. The left ventricle has normal function.  3. Right ventricular systolic function is normal. The right ventricular size is normal.  4. Left atrial size was mildly dilated. No left atrial/left atrial appendage thrombus was detected.  5. Right atrial size was mildly dilated.  6. A small pericardial effusion is present. The pericardial effusion is circumferential. There is no evidence of cardiac tamponade.  7. 2.4 x 0.4 cm vegetation attached below posterior leaflet. The mitral valve is degenerative. Mild mitral valve regurgitation. No evidence of mitral stenosis. Moderate mitral annular calcification.  8. The aortic valve is normal in structure. Aortic valve  regurgitation is not visualized.  9. Thrombus material on dialysis catheter leaflet tip in the right atrium (chronic seen on prior study). Conclusion(s)/Recommendation(s): Findings are concerning for vegetation/infective endocarditis as detailed above. Admit to hospital for ID/CT surgery consultation. FINDINGS  Left Ventricle: Left ventricular ejection fraction, by  estimation, is 60 to 65%. The left ventricle has normal function. The left ventricular internal cavity size was normal in size. Right Ventricle: The right ventricular size is normal. No increase in right ventricular wall thickness. Right ventricular systolic function is normal. Left Atrium: Left atrial size was mildly dilated. No left atrial/left atrial appendage thrombus was detected. Right Atrium: Right atrial size was mildly dilated. Pericardium: A small pericardial effusion is present. The pericardial effusion is circumferential. There is no evidence of cardiac tamponade. Mitral Valve: 2.4 x 0.4 cm vegetation attached below posterior leaflet. The mitral valve is degenerative in appearance. Moderate mitral annular calcification. A large vegetation is seen on the posterior mitral leaflet. The MV vegetation measures 24 mm x 4 mm. Mild mitral valve regurgitation. No evidence of mitral valve stenosis. MV peak gradient, 6.0 mmHg. The mean mitral valve gradient is 3.0 mmHg. Tricuspid Valve: The tricuspid valve is normal in structure. Tricuspid valve regurgitation is mild. Aortic Valve: The aortic valve is normal in structure. Aortic valve regurgitation is not visualized. Pulmonic Valve: The pulmonic valve was normal in structure. Pulmonic valve regurgitation is trivial. Aorta: The aortic root is normal in size and structure. IAS/Shunts: No atrial level shunt detected by color flow Doppler.  MITRAL VALVE              TRICUSPID VALVE MV Peak grad: 6.0 mmHg    TR Peak grad:   25.4 mmHg MV Mean grad: 3.0 mmHg    TR Vmax:        252.00 cm/s MV Vmax:      1.22 m/s  MV Vmean:     86.2 cm/s MR Peak grad: 89.5 mmHg MR Mean grad: 54.0 mmHg MR Vmax:      473.00 cm/s MR Vmean:     345.0 cm/s Candee Furbish MD Electronically signed by Candee Furbish MD Signature Date/Time: 09/30/2022/2:14:15 PM    Final     Medications:  [START ON 10/04/2022]  ceFAZolin (ANCEF) IV     ceFEPime (MAXIPIME) IV 1 g (10/01/22 2105)   DAPTOmycin (CUBICIN) 650 mg in sodium chloride 0.9 % IVPB 650 mg (10/01/22 0127)    amLODipine  5 mg Oral Daily   apixaban  5 mg Oral BID   calcium acetate  2,001 mg Oral TID WC   Chlorhexidine Gluconate Cloth  6 each Topical Q0600   doxercalciferol  3 mcg Intravenous Q M,W,F-HD   gabapentin  300 mg Oral Q M,W,F   guaiFENesin  1,200 mg Oral BID   insulin aspart  0-6 Units Subcutaneous TID WC   insulin glargine  26 Units Subcutaneous QHS   irbesartan  150 mg Oral Daily    Dialysis Orders: [facility]  MWF.  3 hours 45 minutes.  2K/2 Ca  400/700 flow rates.  EDW 101.7 kg.     Venofer 50 mg once weekly Hectorol 3 mcg every treatment   Assessment/Plan: 1. Mitral valve endocarditis/TDC thrombus - s/p TEE 10/5. TDC removed by IR yesterday for line holiday.  Plan to be replaced on Monday.  Appreciate their assistance.  BC NGTD. On cefepime & daptomycin. ABX per ID. CTS evaluated and deemed no indication for surgery.  Cont medical management and repeat ECHO in 2-3 weeks.  2. ESRD - on HD MWF.  HD yesterday. Line holiday over weekend.  Plan for HD Monday or Tuesday following new TDC placement. 3. Anemia of CKD- Hgb 11.1. No ESA indicated. Hold IV iron with acute infection.  4. Secondary hyperparathyroidism - Ca  at goal. Will check phos. Continue home binders and VDRA.   5. HTN/volume - BP improved with HD. Does not appear volume overloaded,  continue UF as tolerated.  6. Nutrition - Renal diet w/fluid restrictions.   Jen Mow, PA-C [facility] 10/02/2022,11:39 AM  LOS: 2 days

## 2022-10-02 NOTE — Plan of Care (Signed)

## 2022-10-02 NOTE — Plan of Care (Signed)
  Problem: Education: Goal: Ability to describe self-care measures that may prevent or decrease complications (Diabetes Survival Skills Education) will improve Outcome: Progressing   Problem: Coping: Goal: Ability to adjust to condition or change in health will improve Outcome: Progressing   Problem: Fluid Volume: Goal: Ability to maintain a balanced intake and output will improve Outcome: Progressing   Problem: Skin Integrity: Goal: Risk for impaired skin integrity will decrease Outcome: Progressing   Problem: Education: Goal: Knowledge of General Education information will improve Description: Including pain rating scale, medication(s)/side effects and non-pharmacologic comfort measures Outcome: Progressing   Problem: Nutrition: Goal: Adequate nutrition will be maintained Outcome: Progressing   Problem: Safety: Goal: Ability to remain free from injury will improve Outcome: Progressing   Problem: Skin Integrity: Goal: Risk for impaired skin integrity will decrease Outcome: Progressing

## 2022-10-02 NOTE — Progress Notes (Signed)
PROGRESS NOTE    ZAARA SPROWL  UXL:[id number] DOB: 01/18/1971 DOA: 09/30/2022 PCP: [name], MD    No chief complaint on file.   Brief Narrative:    Heather [name] is a 51 y.o. 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   Patient has had chronic dry cough for about 1 year, has had multiple rounds of OTC medications as well as antibiotics with no significant improvement.  She also went to see pulmonology recently and had a normal lung function test.  [name] a non-smoker and husband only smokes outside of the house.  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  In May, echocardiogram found she had a small " clot" sitting on the tip of the HD catheter in the right atrium, for which she was evaluated by CT surgery and recommended conservative management with continuing Eliquis but no surgical intervention.  She remained asymptomatic and on September 19, repeat TTE showed stable right atrium HD catheter tip clot but new suspected vegetation like structure on the left posterior mitral valve measuring at 0.9 x0.6 centimeter, and patient was scheduled for TEE today.  On today's TEE, finding concerning for posterior mitral valve vegetation sizing at 2.4 x 0.4 cm which raised suspicion for endocarditis.  Patient reported cough has been chronic and last week, patient started to have coughing up of light yellowish sputum, denies any fever chills no joint pain no weight loss.  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 -Continue with IV antibiotics, IV daptomycin and cefepime. -TDC was discontinued 10/6 by IR to allow for line holiday, IR consulted for line placement on Monday for HD.   -Following repeat blood cultures, remain negative -CT surgery input greatly appreciated, repeat echo in 2 to 3 weeks to see response to treatment     Chronic cough -No clear etiology.  Symptomatic management. 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  Objective: Vitals:   10/01/22 1936 10/01/22 2326 10/02/22 0339 10/02/22 1207  BP: (!) 124/58 (!) 142/69 129/66 (!) 160/79  Pulse: 93 89  82  Resp: '17 14 19 20  '$ Temp: 98.5 F (36.9 C) 98.2 F (36.8 C) 98 F (36.7 C) 97.9 F (36.6 C)  TempSrc: Oral Oral Oral Oral  SpO2: 94% 95% 96%   Weight:      Height:        Intake/Output Summary (Last 24 hours) at 10/02/2022 1542 Last data filed at 10/01/2022 1648 Gross per 24 hour  Intake 61.9 ml  Output --  Net 61.9 ml  Filed Weights   09/30/22 0958  Weight: 107 kg    Examination:  Awake Alert, Oriented X 3, No new F.N deficits, Normal affect Symmetrical Chest wall movement, Good air movement bilaterally, CTAB RRR,No Gallops,Rubs or new Murmurs, No Parasternal Heave +ve B.Sounds, Abd Soft, No tenderness, No rebound - guarding or rigidity. No Cyanosis, Right BKA    Data Reviewed: I have personally reviewed following labs and imaging studies  CBC: Recent Labs  Lab 09/30/22 1110 09/30/22 2023 10/02/22 0327   WBC  --  9.4 7.8  HGB 16.0* 12.8 11.1*  HCT 47.0* 41.2 36.9  MCV  --  101.2* 101.4*  PLT  --  134* 269    Basic Metabolic Panel: Recent Labs  Lab 09/30/22 1110 09/30/22 2023 10/02/22 0327  NA 142 139 135  K 3.9 3.3* 3.4*  CL 97* 96* 96*  CO2  --  27 26  GLUCOSE 154* 244* 93  BUN 28* 29* 19  CREATININE 8.00* 8.92* 6.61*  CALCIUM  --  9.6 9.4    GFR: Estimated Creatinine Clearance: 13 mL/min (A) (by C-G formula based on SCr of 6.61 mg/dL (H)).  Liver Function Tests: Recent Labs  Lab 09/30/22 2023  AST 15  ALT 12  ALKPHOS 98  BILITOT 0.7  PROT 8.1  ALBUMIN 3.4*    CBG: Recent Labs  Lab 09/30/22 2117 10/01/22 1504 10/01/22 2115 10/02/22 0851 10/02/22 1205  GLUCAP 249* 91 166* 147* 85     Recent Results (from the past 240 hour(s))  Culture, blood (Routine X 2) w Reflex to ID Panel     Status: None (Preliminary result)   Collection Time: 09/30/22  8:21 PM   Specimen: BLOOD  Result Value Ref Range Status   Specimen Description BLOOD BLOOD RIGHT HAND  Final   Special Requests   Final    BOTTLES DRAWN AEROBIC AND ANAEROBIC Blood Culture adequate volume   Culture   Final    NO GROWTH 2 DAYS Performed at [facility], [place] [address], [place], [place] [address]    Report Status PENDING  Incomplete  Culture, blood (Routine X 2) w Reflex to ID Panel     Status: None (Preliminary result)   Collection Time: 09/30/22  8:22 PM   Specimen: BLOOD  Result Value Ref Range Status   Specimen Description BLOOD LEFT ANTECUBITAL  Final   Special Requests   Final    BOTTLES DRAWN AEROBIC AND ANAEROBIC Blood Culture adequate volume   Culture   Final    NO GROWTH 2 DAYS Performed at [facility], [place] [address], [place], [place] [address]    Report Status PENDING  Incomplete         Radiology Studies: IR Removal Tun Cv Cath W/O FL  Result Date: 10/01/2022 INDICATION: Patient with history of tunneled dialysis catheter last exchanged in IR 12/29/21 by  Dr. Kathlene Cote. [name] now admitted with bacteremia and request made for removal for line holiday. EXAM: REMOVAL TUNNELED CENTRAL VENOUS CATHETER MEDICATIONS: 10 mL 1% lidocaine ANESTHESIA/SEDATION: None FLUOROSCOPY TIME:  None COMPLICATIONS: None immediate. PROCEDURE: Informed written consent was obtained from the patient after a thorough discussion of the procedural risks, benefits and alternatives. All questions were addressed. Maximal Sterile Barrier Technique was utilized including caps, mask, sterile gowns, sterile gloves, sterile drape, hand hygiene and skin antiseptic. A timeout was performed prior to the initiation of the procedure. The patient's left chest and catheter was prepped and draped in a normal  sterile fashion. Heparin was removed from both ports of catheter. 1% lidocaine was used for local anesthesia. Using gentle blunt dissection the cuff of the catheter was exposed and the catheter was removed in its entirety. Pressure was held till hemostasis was obtained. A sterile dressing was applied. The patient tolerated the procedure well with no immediate complications. IMPRESSION: Successful catheter removal as described above. Read by: Brynda Greathouse PA-C Electronically Signed   By: Markus Daft M.D.   On: 10/01/2022 17:33        Scheduled Meds:  amLODipine  5 mg Oral Daily   apixaban  5 mg Oral BID   calcium acetate  2,001 mg Oral TID WC   Chlorhexidine Gluconate Cloth  6 each Topical Q0600   doxercalciferol  3 mcg Intravenous Q M,W,F-HD   gabapentin  300 mg Oral Q M,W,F   guaiFENesin  1,200 mg Oral BID   insulin aspart  0-6 Units Subcutaneous TID WC   insulin glargine  26 Units Subcutaneous QHS   irbesartan  150 mg Oral Daily   Continuous Infusions:  [START ON 10/04/2022]  ceFAZolin (ANCEF) IV     ceFEPime (MAXIPIME) IV 1 g (10/01/22 2105)   DAPTOmycin (CUBICIN) 650 mg in sodium chloride 0.9 % IVPB 650 mg (10/01/22 0127)     LOS: 2 days     Phillips Climes, MD [facility]   To contact the attending provider between 7A-7P or the covering provider during after hours 7P-7A, please log into the web site [email or web address] and access using universal [place] password for that web site. If you do not have the password, please call the hospital operator.  10/02/2022, 3:42 PM

## 2022-10-03 ENCOUNTER — Encounter (HOSPITAL_COMMUNITY): Payer: Self-pay | Admitting: Cardiology

## 2022-10-03 DIAGNOSIS — I339 Acute and subacute endocarditis, unspecified: Secondary | ICD-10-CM | POA: Diagnosis not present

## 2022-10-03 DIAGNOSIS — N186 End stage renal disease: Secondary | ICD-10-CM | POA: Diagnosis not present

## 2022-10-03 LAB — GLUCOSE, CAPILLARY
Glucose-Capillary: 148 mg/dL — ABNORMAL HIGH (ref 70–99)
Glucose-Capillary: 145 mg/dL — ABNORMAL HIGH (ref 70–99)
Glucose-Capillary: 162 mg/dL — ABNORMAL HIGH (ref 70–99)
Glucose-Capillary: 198 mg/dL — ABNORMAL HIGH (ref 70–99)

## 2022-10-03 LAB — PHOSPHORUS: Phosphorus: 6.1 mg/dL — ABNORMAL HIGH (ref 2.5–4.6)

## 2022-10-03 MED ORDER — LORATADINE 10 MG PO TABS
10.0000 mg | ORAL_TABLET | Freq: Every day | ORAL | Status: DC
  Administered 2022-10-03 – 2022-10-04 (×2): 10 mg via ORAL
  Filled 2022-10-03 (×3): qty 1

## 2022-10-03 MED ORDER — CHLORHEXIDINE GLUCONATE CLOTH 2 % EX PADS
6.0000 | MEDICATED_PAD | Freq: Every day | CUTANEOUS | Status: DC
  Administered 2022-10-04: 6 via TOPICAL

## 2022-10-03 MED ORDER — POLYVINYL ALCOHOL 1.4 % OP SOLN
1.0000 [drp] | OPHTHALMIC | Status: DC | PRN
  Filled 2022-10-03: qty 15

## 2022-10-03 NOTE — Progress Notes (Signed)
[facility] Progress Note   Subjective:   Patient seen and examined at bedside.  Feeling ok.  Denies CP, SOB, abdominal pain, n/v/d, fever, chills, weakness and fatigue.   Objective Vitals:   10/02/22 1207 10/02/22 1500 10/02/22 2002 10/03/22 0913  BP: (!) 160/79 138/68 (!) 156/81 135/65  Pulse: 82 85 90 85  Resp: 20 18 (!) 21 15  Temp: 97.9 F (36.6 C) 98 F (36.7 C) 98.4 F (36.9 C)   TempSrc: Oral Oral Oral   SpO2:   96% 99%  Weight:      Height:       Physical Exam General:WDWN female in NAD Heart:RRR, no mrg Lungs:CTAB, nml WOB on RA Abdomen:soft, NTND Extremities:no LE edema, R BKA Dialysis Access: none   Filed Weights   09/30/22 0958  Weight: 107 kg   No intake or output data in the 24 hours ending 10/03/22 1226  Additional Objective Labs: Basic Metabolic Panel: Recent Labs  Lab 09/30/22 1110 09/30/22 2023 10/02/22 0327  NA 142 139 135  K 3.9 3.3* 3.4*  CL 97* 96* 96*  CO2  --  27 26  GLUCOSE 154* 244* 93  BUN 28* 29* 19  CREATININE 8.00* 8.92* 6.61*  CALCIUM  --  9.6 9.4   Liver Function Tests: Recent Labs  Lab 09/30/22 2023  AST 15  ALT 12  ALKPHOS 98  BILITOT 0.7  PROT 8.1  ALBUMIN 3.4*   CBC: Recent Labs  Lab 09/30/22 1110 09/30/22 2023 10/02/22 0327  WBC  --  9.4 7.8  HGB 16.0* 12.8 11.1*  HCT 47.0* 41.2 36.9  MCV  --  101.2* 101.4*  PLT  --  134* 160   Blood Culture    Component Value Date/Time   SDES BLOOD LEFT ANTECUBITAL 09/30/2022 2022   SPECREQUEST  09/30/2022 2022    BOTTLES DRAWN AEROBIC AND ANAEROBIC Blood Culture adequate volume   CULT  09/30/2022 2022    NO GROWTH 3 DAYS Performed at [facility], [place] [address], [place], [place] [address]    REPTSTATUS PENDING 09/30/2022 2022    Cardiac Enzymes: Recent Labs  Lab 09/30/22 2023  CKTOTAL 47   CBG: Recent Labs  Lab 10/02/22 0851 10/02/22 1205 10/02/22 1621 10/03/22 0915 10/03/22 1155  GLUCAP 147* 85 138* 148* 145*     Studies/Results: IR Removal Tun Cv Cath W/O FL  Result Date: 10/01/2022 INDICATION: Patient with history of tunneled dialysis catheter last exchanged in IR 12/29/21 by Dr. Kathlene Cote. [name] now admitted with bacteremia and request made for removal for line holiday. EXAM: REMOVAL TUNNELED CENTRAL VENOUS CATHETER MEDICATIONS: 10 mL 1% lidocaine ANESTHESIA/SEDATION: None FLUOROSCOPY TIME:  None COMPLICATIONS: None immediate. PROCEDURE: Informed written consent was obtained from the patient after a thorough discussion of the procedural risks, benefits and alternatives. All questions were addressed. Maximal Sterile Barrier Technique was utilized including caps, mask, sterile gowns, sterile gloves, sterile drape, hand hygiene and skin antiseptic. A timeout was performed prior to the initiation of the procedure. The patient's left chest and catheter was prepped and draped in a normal sterile fashion. Heparin was removed from both ports of catheter. 1% lidocaine was used for local anesthesia. Using gentle blunt dissection the cuff of the catheter was exposed and the catheter was removed in its entirety. Pressure was held till hemostasis was obtained. A sterile dressing was applied. The patient tolerated the procedure well with no immediate complications. IMPRESSION: Successful catheter removal as described above. Read by: Sherlie Ban  Zigmund Daniel PA-C Electronically Signed   By: Markus Daft M.D.   On: 10/01/2022 17:33    Medications:  [START ON 10/04/2022]  ceFAZolin (ANCEF) IV     ceFEPime (MAXIPIME) IV 1 g (10/02/22 2115)   DAPTOmycin (CUBICIN) 650 mg in sodium chloride 0.9 % IVPB 650 mg (10/02/22 2241)    amLODipine  5 mg Oral Daily   apixaban  5 mg Oral BID   calcium acetate  2,001 mg Oral TID WC   Chlorhexidine Gluconate Cloth  6 each Topical Q0600   doxercalciferol  3 mcg Intravenous Q M,W,F-HD   gabapentin  300 mg Oral Q M,W,F   guaiFENesin  1,200 mg Oral BID   insulin aspart  0-6 Units Subcutaneous TID WC    insulin glargine  26 Units Subcutaneous QHS   irbesartan  150 mg Oral Daily    Dialysis Orders: [facility]  MWF.  3 hours 45 minutes.  2K/2 Ca  400/700 flow rates.  EDW 101.7 kg.     Venofer 50 mg once weekly Hectorol 3 mcg every treatment   Assessment/Plan: 1. Mitral valve endocarditis/TDC thrombus - s/p TEE 10/5. TDC removed by IR yesterday for line holiday.  Plan to be replaced on Monday.  Appreciate their assistance.  BC NGTD. On cefepime & daptomycin. ABX per ID. CTS evaluated and deemed no indication for surgery.  Cont medical management and repeat ECHO in 2-3 weeks.  2. ESRD - on HD MWF.  Line holiday over weekend.  Plan for HD Monday or Tuesday following new TDC placement.  K 3.4. 3. Anemia of CKD- Hgb 11.1. No ESA indicated. Hold IV iron with acute infection.  4. Secondary hyperparathyroidism - Ca at goal. Will check phos. Continue home binders and VDRA.   5. HTN/volume - BP improved with HD. Does not appear volume overloaded,  continue UF as tolerated.  6. Nutrition - Renal diet w/fluid restrictions.  Jen Mow, PA-C [facility] 10/03/2022,12:26 PM  LOS: 3 days

## 2022-10-03 NOTE — Progress Notes (Signed)
Pharmacy Antibiotic Note  Heather [name] is a 51 y.o. 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  Pharmacy has been consulted for Daptomycin + Cefepime dosing.   The patient is ESRD-MWF - last HD session Friday, 10/6 per notes. [place] removed by IR 10/6 and plan for replacement on 10/9. May get off schedule, will monitor closely.   Plan: - Daptomycin '650mg'$  IV q48h - Cefepime 1g/24h  - Will continue to follow HD schedule/duration, culture results, LOT, and antibiotic de-escalation plans   Height: '5\' 9"'$  (175.3 cm) Weight: 107 kg (236 lb) IBW/kg (Calculated) : 66.2 kg  Temp (24hrs), Avg:98.1 F (36.7 C), Min:97.9 F (36.6 C), Max:98.4 F (36.9 C)  Recent Labs  Lab 09/30/22 1110 09/30/22 2023 10/02/22 0327  WBC  --  9.4 7.8  CREATININE 8.00* 8.92* 6.61*     Estimated Creatinine Clearance: 13 mL/min (A) (by C-G formula based on SCr of 6.61 mg/dL (H)).    Allergies  Allergen Reactions   Ibuprofen Other (See Comments)    CKD stage 3. Should avoid.   Vancomycin Nausea And Vomiting    Severe N/V - avoid if possible   Dilaudid [Hydromorphone Hcl] Itching and Other (See Comments)    Can take with Benadryl.   Iodinated Contrast Media Rash   Tramadol Itching    Antimicrobials this admission: Daptomycin 10/5 >> Cefepime 10/5 >>  Microbiology results: 10/5 BCx >> no growth x3 days 10/5 RCx >>  [employer], Pharm.D PGY1 Pharmacy Resident 10/03/2022 7:44 AM

## 2022-10-03 NOTE — Plan of Care (Signed)
  Problem: Education: Goal: Ability to describe self-care measures that may prevent or decrease complications (Diabetes Survival Skills Education) will improve Outcome: Progressing   Problem: Metabolic: Goal: Ability to maintain appropriate glucose levels will improve Outcome: Progressing   Problem: Nutritional: Goal: Maintenance of adequate nutrition will improve Outcome: Progressing   Problem: Health Behavior/Discharge Planning: Goal: Ability to manage health-related needs will improve Outcome: Progressing   Problem: Activity: Goal: Risk for activity intolerance will decrease Outcome: Progressing   Problem: Coping: Goal: Level of anxiety will decrease Outcome: Progressing

## 2022-10-03 NOTE — Progress Notes (Addendum)
PROGRESS NOTE    Heather [name]  YQM:[id number] DOB: 10-22-1971 DOA: 09/30/2022 PCP: [name], MD    No chief complaint on file.   Brief Narrative:     Heather [name] is a 51 y.o. 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  Patient was sent for direct admission, she was started empirically on IV antibiotics per ID guidance, she has been seen by CT surgery regarding recommendation for her mitral valve vegetation, DC discontinued 10/6 for line holiday.   Assessment & Plan:   Principal Problem:   Endocarditis Active Problems:   Hypertension associated with diabetes ([place])   ESRD on dialysis ([place])  Mitral valve vegetation concerning for infective endocarditis -Was noted to have mitral valve vegetation during outpatient TEE, sent for direct admission. -Antibiotics management per ID, continue with IV antibiotics, IV daptomycin and cefepime. -TDC was discontinued 10/6 by IR to allow for line holiday, IR consulted for line placement on Monday for HD.   -Following repeat blood cultures, remain negative -CT surgery input greatly appreciated, repeat echo in 2 to 3 weeks to see response to treatment     Chronic cough -No clear etiology.  Symptomatic management. 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 Objective: Vitals:   10/02/22 1207 10/02/22 1500 10/02/22 2002 10/03/22 0913  BP: (!) 160/79 138/68 (!) 156/81 135/65  Pulse: 82 85 90 85  Resp: 20 18 (!) 21 15  Temp: 97.9 F (36.6 C) 98 F (36.7 C) 98.4 F (36.9 C)   TempSrc: Oral Oral Oral   SpO2:   96% 99%  Weight:      Height:       No intake or output data in the 24 hours ending 10/03/22 1259  Filed Weights   09/30/22 0958  Weight: 107 kg    Examination:  Awake Alert, Oriented X 3, No new F.N deficits, Normal affect Symmetrical Chest wall movement, Good air movement bilaterally, CTAB RRR,No Gallops,Rubs or new Murmurs, No Parasternal Heave +ve B.Sounds, Abd Soft, No tenderness, No rebound - guarding or rigidity. No Cyanosis, Right BKA    Data Reviewed: I have personally reviewed following labs and imaging studies  CBC: Recent Labs  Lab 09/30/22 1110 09/30/22 2023 10/02/22 0327  WBC  --  9.4 7.8  HGB 16.0* 12.8 11.1*  HCT 47.0* 41.2 36.9  MCV  --  101.2* 101.4*  PLT  --  134* 528    Basic Metabolic Panel: Recent Labs  Lab 09/30/22 1110 09/30/22 2023 10/02/22 0327  NA 142 139 135  K 3.9 3.3* 3.4*  CL 97* 96* 96*  CO2  --  27 26  GLUCOSE 154* 244* 93  BUN 28* 29* 19  CREATININE 8.00* 8.92* 6.61*  CALCIUM  --  9.6 9.4    GFR: Estimated Creatinine Clearance: 13 mL/min (A) (by C-G formula  based on SCr of 6.61 mg/dL (H)).  Liver Function Tests: Recent Labs  Lab 09/30/22 2023  AST 15  ALT 12  ALKPHOS 98  BILITOT 0.7  PROT 8.1  ALBUMIN 3.4*    CBG: Recent Labs  Lab 10/02/22 0851 10/02/22 1205 10/02/22 1621 10/03/22 0915 10/03/22 1155  GLUCAP 147* 85 138* 148* 145*     Recent Results (from the past 240 hour(s))  Culture, blood (Routine X 2) w Reflex to ID Panel     Status: None (Preliminary result)   Collection Time: 09/30/22  8:21 PM   Specimen: BLOOD  Result Value Ref Range Status   Specimen Description BLOOD BLOOD RIGHT HAND  Final   Special Requests   Final     BOTTLES DRAWN AEROBIC AND ANAEROBIC Blood Culture adequate volume   Culture   Final    NO GROWTH 3 DAYS Performed at [facility], [place] [address] [place], [place] [address]    Report Status PENDING  Incomplete  Culture, blood (Routine X 2) w Reflex to ID Panel     Status: None (Preliminary result)   Collection Time: 09/30/22  8:22 PM   Specimen: BLOOD  Result Value Ref Range Status   Specimen Description BLOOD LEFT ANTECUBITAL  Final   Special Requests   Final    BOTTLES DRAWN AEROBIC AND ANAEROBIC Blood Culture adequate volume   Culture   Final    NO GROWTH 3 DAYS Performed at [facility], [place] [address], [place], [place] [address]    Report Status PENDING  Incomplete         Radiology Studies: IR Removal Tun Cv Cath W/O FL  Result Date: 10/01/2022 INDICATION: Patient with history of tunneled dialysis catheter last exchanged in IR 12/29/21 by Dr. Kathlene Cote. [name] now admitted with bacteremia and request made for removal for line holiday. EXAM: REMOVAL TUNNELED CENTRAL VENOUS CATHETER MEDICATIONS: 10 mL 1% lidocaine ANESTHESIA/SEDATION: None FLUOROSCOPY TIME:  None COMPLICATIONS: None immediate. PROCEDURE: Informed written consent was obtained from the patient after a thorough discussion of the procedural risks, benefits and alternatives. All questions were addressed. Maximal Sterile Barrier Technique was utilized including caps, mask, sterile gowns, sterile gloves, sterile drape, hand hygiene and skin antiseptic. A timeout was performed prior to the initiation of the procedure. The patient's left chest and catheter was prepped and draped in a normal sterile fashion. Heparin was removed from both ports of catheter. 1% lidocaine was used for local anesthesia. Using gentle blunt dissection the cuff of the catheter was exposed and the catheter was removed in its entirety. Pressure was held till hemostasis was obtained. A sterile dressing was applied. The patient tolerated the  procedure well with no immediate complications. IMPRESSION: Successful catheter removal as described above. Read by: Brynda Greathouse PA-C Electronically Signed   By: Markus Daft M.D.   On: 10/01/2022 17:33        Scheduled Meds:  amLODipine  5 mg Oral Daily   apixaban  5 mg Oral BID   calcium acetate  2,001 mg Oral TID WC   Chlorhexidine Gluconate Cloth  6 each Topical Q0600   [START ON 10/04/2022] Chlorhexidine Gluconate Cloth  6 each Topical Q0600   doxercalciferol  3 mcg Intravenous Q M,W,F-HD   gabapentin  300 mg Oral Q M,W,F   guaiFENesin  1,200 mg Oral BID   insulin aspart  0-6 Units Subcutaneous TID WC   insulin glargine  26 Units Subcutaneous QHS   irbesartan  150 mg Oral Daily   loratadine  10 mg Oral Daily   Continuous Infusions:  [START ON 10/04/2022]  ceFAZolin (ANCEF) IV     ceFEPime (MAXIPIME) IV 1 g (10/02/22 2115)   DAPTOmycin (CUBICIN) 650 mg in sodium chloride 0.9 % IVPB 650 mg (10/02/22 2241)     LOS: 3 days     Phillips Climes, MD [facility]   To contact the attending provider between 7A-7P or the covering provider during after hours 7P-7A, please log into the web site [email or web address] and access using universal [place] password for that web site. If you do not have the password, please call the hospital operator.  10/03/2022, 12:59 PM

## 2022-10-04 ENCOUNTER — Inpatient Hospital Stay (HOSPITAL_COMMUNITY): Payer: Medicare Other

## 2022-10-04 DIAGNOSIS — Z992 Dependence on renal dialysis: Secondary | ICD-10-CM

## 2022-10-04 DIAGNOSIS — E1159 Type 2 diabetes mellitus with other circulatory complications: Secondary | ICD-10-CM | POA: Diagnosis not present

## 2022-10-04 DIAGNOSIS — I152 Hypertension secondary to endocrine disorders: Secondary | ICD-10-CM

## 2022-10-04 DIAGNOSIS — I339 Acute and subacute endocarditis, unspecified: Secondary | ICD-10-CM | POA: Diagnosis not present

## 2022-10-04 DIAGNOSIS — I058 Other rheumatic mitral valve diseases: Secondary | ICD-10-CM | POA: Diagnosis not present

## 2022-10-04 DIAGNOSIS — I330 Acute and subacute infective endocarditis: Secondary | ICD-10-CM | POA: Diagnosis not present

## 2022-10-04 DIAGNOSIS — N186 End stage renal disease: Secondary | ICD-10-CM | POA: Diagnosis not present

## 2022-10-04 HISTORY — PX: IR FLUORO GUIDE CV LINE RIGHT: IMG2283

## 2022-10-04 HISTORY — PX: IR US GUIDE VASC ACCESS RIGHT: IMG2390

## 2022-10-04 LAB — GLUCOSE, CAPILLARY
Glucose-Capillary: 72 mg/dL (ref 70–99)
Glucose-Capillary: 63 mg/dL — ABNORMAL LOW (ref 70–99)
Glucose-Capillary: 105 mg/dL — ABNORMAL HIGH (ref 70–99)
Glucose-Capillary: 93 mg/dL (ref 70–99)

## 2022-10-04 MED ORDER — LIDOCAINE HCL (PF) 1 % IJ SOLN
INTRAMUSCULAR | Status: AC | PRN
  Administered 2022-10-04: 10 mL

## 2022-10-04 MED ORDER — DEXTROSE 50 % IV SOLN
12.5000 g | INTRAVENOUS | Status: AC
  Administered 2022-10-04: 12.5 g via INTRAVENOUS
  Filled 2022-10-04: qty 50

## 2022-10-04 MED ORDER — FENTANYL CITRATE (PF) 100 MCG/2ML IJ SOLN
INTRAMUSCULAR | Status: AC
  Filled 2022-10-04: qty 2

## 2022-10-04 MED ORDER — VANCOMYCIN HCL 2000 MG/400ML IV SOLN
2000.0000 mg | Freq: Once | INTRAVENOUS | Status: DC
  Filled 2022-10-04: qty 400

## 2022-10-04 MED ORDER — HEPARIN SODIUM (PORCINE) 1000 UNIT/ML IJ SOLN
INTRAMUSCULAR | Status: AC
  Administered 2022-10-05: 1000 [IU]
  Filled 2022-10-04: qty 4

## 2022-10-04 MED ORDER — MIDAZOLAM HCL 2 MG/2ML IJ SOLN
INTRAMUSCULAR | Status: AC | PRN
  Administered 2022-10-04: 1 mg via INTRAVENOUS

## 2022-10-04 MED ORDER — MIDAZOLAM HCL 2 MG/2ML IJ SOLN
INTRAMUSCULAR | Status: AC
  Filled 2022-10-04: qty 2

## 2022-10-04 MED ORDER — INSULIN GLARGINE 100 UNIT/ML ~~LOC~~ SOLN
20.0000 [IU] | Freq: Every day | SUBCUTANEOUS | Status: DC
  Administered 2022-10-05: 20 [IU] via SUBCUTANEOUS
  Filled 2022-10-04 (×2): qty 0.2

## 2022-10-04 MED ORDER — CEFAZOLIN SODIUM-DEXTROSE 2-4 GM/100ML-% IV SOLN
INTRAVENOUS | Status: AC
  Filled 2022-10-04: qty 100

## 2022-10-04 MED ORDER — FENTANYL CITRATE (PF) 100 MCG/2ML IJ SOLN
INTRAMUSCULAR | Status: AC | PRN
  Administered 2022-10-04: 50 ug via INTRAVENOUS

## 2022-10-04 MED ORDER — HEPARIN SODIUM (PORCINE) 1000 UNIT/ML IJ SOLN
INTRAMUSCULAR | Status: AC
  Administered 2022-10-04: 3.2 mL
  Filled 2022-10-04: qty 10

## 2022-10-04 MED ORDER — VANCOMYCIN HCL 500 MG/100ML IV SOLN
500.0000 mg | Freq: Once | INTRAVENOUS | Status: DC
  Filled 2022-10-04: qty 100

## 2022-10-04 MED ORDER — VANCOMYCIN HCL 500 MG/100ML IV SOLN: 500.0000 mg | Freq: Once | INTRAVENOUS | Status: DC

## 2022-10-04 MED ORDER — VANCOMYCIN VARIABLE DOSE PER UNSTABLE RENAL FUNCTION (PHARMACIST DOSING): Status: DC

## 2022-10-04 MED ORDER — APIXABAN 5 MG PO TABS
5.0000 mg | ORAL_TABLET | Freq: Two times a day (BID) | ORAL | Status: DC
  Administered 2022-10-05: 5 mg via ORAL
  Filled 2022-10-04: qty 1

## 2022-10-04 MED ORDER — LIDOCAINE HCL 1 % IJ SOLN
INTRAMUSCULAR | Status: AC
  Filled 2022-10-04: qty 20

## 2022-10-04 NOTE — Progress Notes (Signed)
[facility] Progress Note   Subjective:    Seen and examined patient at bedside. She wants something to eat. TDC removed yesterday and now on line holiday. Plan for new [facility] placement today then HD afterwards.  Objective Vitals:   10/03/22 1607 10/03/22 2039 10/04/22 0514 10/04/22 0800  BP: (!) 162/85 (!) 164/71 (!) 135/58 (!) 178/85  Pulse: 93 90 80 85  Resp: '15 16 13 15  '$ Temp: 98.2 F (36.8 C) 98.4 F (36.9 C)    TempSrc: Oral Oral  Oral  SpO2: 98% 98% 94%   Weight:      Height:       Physical Exam General:WDWN female in NAD Heart:RRR, no mrg Lungs:CTAB, nml WOB on RA Abdomen:soft, NTND Extremities:no LE edema, R BKA Dialysis Access: none   Filed Weights   09/30/22 0958  Weight: 107 kg   No intake or output data in the 24 hours ending 10/04/22 1131  Additional Objective Labs: Basic Metabolic Panel: Recent Labs  Lab 09/30/22 1110 09/30/22 2023 10/02/22 0327 10/03/22 1245  NA 142 139 135  --   K 3.9 3.3* 3.4*  --   CL 97* 96* 96*  --   CO2  --  27 26  --   GLUCOSE 154* 244* 93  --   BUN 28* 29* 19  --   CREATININE 8.00* 8.92* 6.61*  --   CALCIUM  --  9.6 9.4  --   PHOS  --   --   --  6.1*   Liver Function Tests: Recent Labs  Lab 09/30/22 2023  AST 15  ALT 12  ALKPHOS 98  BILITOT 0.7  PROT 8.1  ALBUMIN 3.4*   No results for input(s): "LIPASE", "AMYLASE" in the last 168 hours. CBC: Recent Labs  Lab 09/30/22 1110 09/30/22 2023 10/02/22 0327  WBC  --  9.4 7.8  HGB 16.0* 12.8 11.1*  HCT 47.0* 41.2 36.9  MCV  --  101.2* 101.4*  PLT  --  134* 160   Blood Culture    Component Value Date/Time   SDES BLOOD LEFT ANTECUBITAL 09/30/2022 2022   SPECREQUEST  09/30/2022 2022    BOTTLES DRAWN AEROBIC AND ANAEROBIC Blood Culture adequate volume   CULT  09/30/2022 2022    NO GROWTH 3 DAYS Performed at [facility], [place] [address], [place], [place] [address]    REPTSTATUS PENDING 09/30/2022 2022    Cardiac Enzymes: Recent  Labs  Lab 09/30/22 2023  CKTOTAL 47   CBG: Recent Labs  Lab 10/03/22 1155 10/03/22 1606 10/03/22 2133 10/04/22 0819 10/04/22 1027  GLUCAP 145* 162* 198* 72 63*   Iron Studies: No results for input(s): "IRON", "TIBC", "TRANSFERRIN", "FERRITIN" in the last 72 hours. Lab Results  Component Value Date   INR 1.0 03/19/2020   INR 1.1 05/31/2019   INR 1.01 07/11/2018   Studies/Results: No results found.  Medications:   ceFAZolin (ANCEF) IV     ceFEPime (MAXIPIME) IV 1 g (10/03/22 2045)   DAPTOmycin (CUBICIN) 650 mg in sodium chloride 0.9 % IVPB 650 mg (10/02/22 2241)    amLODipine  5 mg Oral Daily   apixaban  5 mg Oral BID   calcium acetate  2,001 mg Oral TID WC   Chlorhexidine Gluconate Cloth  6 each Topical Q0600   Chlorhexidine Gluconate Cloth  6 each Topical Q0600   doxercalciferol  3 mcg Intravenous Q M,W,F-HD   gabapentin  300 mg Oral Q M,W,F   guaiFENesin  1,200 mg Oral BID   insulin aspart  0-6 Units Subcutaneous TID WC   insulin glargine  26 Units Subcutaneous QHS   irbesartan  150 mg Oral Daily   loratadine  10 mg Oral Daily    Dialysis Orders: [place].  MWF.  3 hours 45 minutes.  2K/2 Ca  400/700 flow rates.  EDW 101.7 kg.     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 TDC removed by IR 10/7 for line holiday.  Plan to be replaced today.  Appreciate their assistance.  BC from 10/5 NGTD. Repeat BC from 10/8 pending. On cefepime & daptomycin. ABX per ID. CTS evaluated and deemed no indication for surgery.  Cont medical management and repeat ECHO in 2-3 weeks.  2. ESRD - on HD MWF.  Line holiday over weekend.  Plan for HD today or tomorrow following new TDC placement.  K 3.4. 3. Anemia of CKD- Hgb 11.1. No ESA indicated. Hold IV iron with acute infection.  4. Secondary hyperparathyroidism - Ca at goal. Phos mildly elevated. Continue home binders and VDRA.   5. HTN/volume  - BP improved with HD. Does not appear volume overloaded,  continue UF as tolerated.  6. Nutrition - Renal diet w/fluid restrictions.  Tobie Poet, NP [facility] 10/04/2022,11:31 AM  LOS: 4 days

## 2022-10-04 NOTE — Progress Notes (Signed)
Inpatient Diabetes Program Recommendations  AACE/ADA: New Consensus Statement on Inpatient Glycemic Control (2015)  Target Ranges:  Prepandial:   less than 140 mg/dL      Peak postprandial:   less than 180 mg/dL (1-2 hours)      Critically ill patients:  140 - 180 mg/dL   Lab Results  Component Value Date   GLUCAP 63 (L) 10/04/2022   HGBA1C 7.6 (H) 09/30/2022    Review of Glycemic Control  Latest Reference Range & Units 10/03/22 09:15 10/03/22 11:55 10/03/22 16:06 10/03/22 21:33 10/04/22 08:19 10/04/22 10:27  Glucose-Capillary 70 - 99 mg/dL 148 (H) 145 (H) 162 (H) 198 (H) 72 63 (L)   Diabetes history: DM Outpatient Diabetes medications:  Tresiba 26 units q HS Novolog 6 units tid with meals Current orders for Inpatient glycemic control:  Novolog 0-6 units tid with meals Lantus 26 units q HS  Inpatient Diabetes Program Recommendations:    Note mild low this AM.  Consider reducing Lantus to 20 units q HS.   Thanks,  Adah Perl, RN, BC-ADM Inpatient Diabetes Coordinator Pager [phone]  (8a-5p)

## 2022-10-04 NOTE — Plan of Care (Signed)

## 2022-10-04 NOTE — Progress Notes (Signed)
Pharmacy Antibiotic Note  Heather [name] is a 51 y.o. 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  Pharmacy has been consulted for Vancomycin + Cefepime dosing. Plans noted for 6 weeks treatment. She has an allergy of nausea/vomiting with vancomycin and plans are to trial a dose.   The patient is ESRD-MWF - The HD catheter was replaced today and HD planned for later tonight. -Daptomycin given 10/7 ~11pm  Plan: - Vancomycin 2000ng IV post HD - Cefepime 1g/24h  - Will continue to follow HD schedule/duration, culture results, LOT, and antibiotic de-escalation plans   Height: '5\' 9"'$  (175.3 cm) Weight: 107 kg (236 lb) IBW/kg (Calculated) : 66.2 kg  Temp (24hrs), Avg:98.3 F (36.8 C), Min:98 F (36.7 C), Max:98.4 F (36.9 C)  Recent Labs  Lab 09/30/22 1110 09/30/22 2023 10/02/22 0327  WBC  --  9.4 7.8  CREATININE 8.00* 8.92* 6.61*     Estimated Creatinine Clearance: 13 mL/min (A) (by C-G formula based on SCr of 6.61 mg/dL (H)).    Allergies  Allergen Reactions   Ibuprofen Other (See Comments)    CKD stage 3. Should avoid.   Vancomycin Nausea And Vomiting    Severe N/V - avoid if possible   Dilaudid [Hydromorphone Hcl] Itching and Other (See Comments)    Can take with Benadryl.   Iodinated Contrast Media Rash   Tramadol Itching    Antimicrobials this admission: Daptomycin 10/5 >>10/9 Cefepime 10/5 >> Vanco 10/9>>   Microbiology results: 10/5 BCx >>ngtd 10/8 blood x2  Hildred Laser, PharmD Clinical Pharmacist **Pharmacist phone directory can now be found on [email or web address] ([email or web address]).  Listed under [place].

## 2022-10-04 NOTE — Progress Notes (Addendum)
PROGRESS NOTE        PATIENT DETAILS Name: Heather [name] Age: 51 y.o. Sex: female Date of Birth: 04-May-1971 Admit Date: 09/30/2022 Admitting Physician Lequita Halt, MD QMV:[id number], [name], MD  Brief Summary: Patient is a 51 y.o.  female with history of ESRD on HD MWF, right BKA, VTE on Eliquis-who was found to have mitral valve vegetation on outpatient TEE-admitted to [facility] for further work-up.  Significant events: 10/5>> outpatient TEE with mitral valve vegetation-admit to [facility].  Significant studies:   Significant microbiology data: 10/5>> blood culture: No growth  Procedures: 10/5>> EXB:MWUXL mitral valve vegetation 2.5 x 0.4 cm.  EF 60%. 10/6>> TDC removed  Consults: Infectious disease Nephrology CT surgery  Subjective: Lying comfortably in bed-denies any chest pain or shortness of breath.  Objective: Vitals: Blood pressure (!) 178/85, pulse 85, temperature 98.4 F (36.9 C), temperature source Oral, resp. rate 15, height '5\' 9"'$  (1.753 m), weight 107 kg, last menstrual period 09/10/2021, SpO2 94 %.   Exam: Gen Exam:Alert awake-not in any distress HEENT:atraumatic, normocephalic Chest: B/L clear to auscultation anteriorly CVS:S1S2 regular Abdomen:soft non tender, non distended Extremities:no edema Neurology: Non focal Skin: no rash  Pertinent Labs/Radiology:    Latest Ref Rng & Units 10/02/2022    3:27 AM 09/30/2022    8:23 PM 09/30/2022   11:10 AM  CBC  WBC 4.0 - 10.5 K/uL 7.8  9.4    Hemoglobin 12.0 - 15.0 g/dL 11.1  12.8  16.0   Hematocrit 36.0 - 46.0 % 36.9  41.2  47.0   Platelets 150 - 400 K/uL 160  134      Lab Results  Component Value Date   NA 135 10/02/2022   K 3.4 (L) 10/02/2022   CL 96 (L) 10/02/2022   CO2 26 10/02/2022      Assessment/Plan: 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: Afebrile-Line holiday in progress-with plans for [facility] placement later today ID following and directing antimicrobial  therapy CT surgery recommended repeating echo in 2-3 weeks to assess response to treatment  ESRD on HD MWF Nephrology following  Normocytic anemia Likely due to CKD Follow CBC EPO/iron defer to nephrology service  HTN BP stable-continue amlodipine, Avapro  History of VTE Eliquis  DM-2 (A1c 7.6 on 10/5) Mild hypoglycemia this morning Decrease Lantus to 20 units, continue SSI Follow/optimize  Recent Labs    10/04/22 0819 10/04/22 1027 10/04/22 1215  GLUCAP 72 63* 105*    Peripheral neuropathy Continue Neurontin  Right BKA Stump site benign  Obesity: Estimated body mass index is 34.85 kg/m as calculated from the following:   Height as of this encounter: '5\' 9"'$  (1.753 m).   Weight as of this encounter: 107 kg.   Code status:   Code Status: Full Code   DVT Prophylaxis: apixaban (ELIQUIS) tablet 5 mg    Family Communication: None at bedside   Disposition Plan: Status is: Inpatient Remains inpatient appropriate because: Endocarditis-Line holiday-TDC to be replaced later today.   Planned Discharge Destination:Home   Diet: Diet Order             Diet NPO time specified Except for: Sips with Meds  Diet effective midnight                     Antimicrobial agents: Anti-infectives (From admission, onward)    Start  Dose/Rate Route Frequency Ordered Stop   10/04/22 0600  ceFAZolin (ANCEF) IVPB 2g/100 mL premix        2 g 200 mL/hr over 30 Minutes Intravenous To Radiology 10/01/22 1738 10/05/22 0600   10/01/22 2130  ceFEPIme (MAXIPIME) 1 g in sodium chloride 0.9 % 100 mL IVPB       See Hyperspace for full Linked Orders Report.   1 g 200 mL/hr over 30 Minutes Intravenous Every 24 hours 09/30/22 2012     10/01/22 0000  vancomycin (VANCOREADY) IVPB 500 mg/100 mL  Status:  Discontinued       See Hyperspace for full Linked Orders Report.   500 mg 100 mL/hr over 60 Minutes Intravenous  Once 09/30/22 2101 09/30/22 2114   09/30/22 2330  vancomycin  (VANCOREADY) IVPB 500 mg/100 mL  Status:  Discontinued       See Hyperspace for full Linked Orders Report.   500 mg 100 mL/hr over 60 Minutes Intravenous  Once 09/30/22 2012 09/30/22 2056   09/30/22 2200  vancomycin (VANCOREADY) IVPB 2000 mg/400 mL  Status:  Discontinued       See Hyperspace for full Linked Orders Report.   2,000 mg 200 mL/hr over 120 Minutes Intravenous  Once 09/30/22 2101 09/30/22 2114   09/30/22 2200  DAPTOmycin (CUBICIN) 650 mg in sodium chloride 0.9 % IVPB        8 mg/kg  82.5 kg (Adjusted) 126 mL/hr over 30 Minutes Intravenous Every 48 hours 09/30/22 2114     09/30/22 2130  vancomycin (VANCOREADY) IVPB 2000 mg/400 mL  Status:  Discontinued       See Hyperspace for full Linked Orders Report.   2,000 mg 200 mL/hr over 120 Minutes Intravenous  Once 09/30/22 2012 09/30/22 2056   09/30/22 2130  ceFEPIme (MAXIPIME) 2 g in sodium chloride 0.9 % 100 mL IVPB       See Hyperspace for full Linked Orders Report.   2 g 200 mL/hr over 30 Minutes Intravenous  Once 09/30/22 2012 10/01/22 1154   09/30/22 2013  vancomycin variable dose per unstable renal function (pharmacist dosing)  Status:  Discontinued         Does not apply See admin instructions 09/30/22 2013 09/30/22 2056        MEDICATIONS: Scheduled Meds:  amLODipine  5 mg Oral Daily   apixaban  5 mg Oral BID   calcium acetate  2,001 mg Oral TID WC   Chlorhexidine Gluconate Cloth  6 each Topical Q0600   Chlorhexidine Gluconate Cloth  6 each Topical Q0600   doxercalciferol  3 mcg Intravenous Q M,W,F-HD   gabapentin  300 mg Oral Q M,W,F   guaiFENesin  1,200 mg Oral BID   insulin aspart  0-6 Units Subcutaneous TID WC   insulin glargine  26 Units Subcutaneous QHS   irbesartan  150 mg Oral Daily   loratadine  10 mg Oral Daily   Continuous Infusions:   ceFAZolin (ANCEF) IV     ceFEPime (MAXIPIME) IV 1 g (10/03/22 2045)   DAPTOmycin (CUBICIN) 650 mg in sodium chloride 0.9 % IVPB 650 mg (10/02/22 2241)   PRN  Meds:.acetaminophen, benzonatate, ondansetron (ZOFRAN) IV, oxyCODONE, polyvinyl alcohol   I have personally reviewed following labs and imaging studies  LABORATORY DATA: CBC: Recent Labs  Lab 09/30/22 1110 09/30/22 2023 10/02/22 0327  WBC  --  9.4 7.8  HGB 16.0* 12.8 11.1*  HCT 47.0* 41.2 36.9  MCV  --  101.2* 101.4*  PLT  --  134* 604    Basic Metabolic Panel: Recent Labs  Lab 09/30/22 1110 09/30/22 2023 10/02/22 0327 10/03/22 1245  NA 142 139 135  --   K 3.9 3.3* 3.4*  --   CL 97* 96* 96*  --   CO2  --  27 26  --   GLUCOSE 154* 244* 93  --   BUN 28* 29* 19  --   CREATININE 8.00* 8.92* 6.61*  --   CALCIUM  --  9.6 9.4  --   PHOS  --   --   --  6.1*    GFR: Estimated Creatinine Clearance: 13 mL/min (A) (by C-G formula based on SCr of 6.61 mg/dL (H)).  Liver Function Tests: Recent Labs  Lab 09/30/22 2023  AST 15  ALT 12  ALKPHOS 98  BILITOT 0.7  PROT 8.1  ALBUMIN 3.4*   No results for input(s): "LIPASE", "AMYLASE" in the last 168 hours. No results for input(s): "AMMONIA" in the last 168 hours.  Coagulation Profile: No results for input(s): "INR", "PROTIME" in the last 168 hours.  Cardiac Enzymes: Recent Labs  Lab 09/30/22 2023  CKTOTAL 47    BNP (last 3 results) No results for input(s): "PROBNP" in the last 8760 hours.  Lipid Profile: No results for input(s): "CHOL", "HDL", "LDLCALC", "TRIG", "CHOLHDL", "LDLDIRECT" in the last 72 hours.  Thyroid Function Tests: No results for input(s): "TSH", "T4TOTAL", "FREET4", "T3FREE", "THYROIDAB" in the last 72 hours.  Anemia Panel: No results for input(s): "VITAMINB12", "FOLATE", "FERRITIN", "TIBC", "IRON", "RETICCTPCT" in the last 72 hours.  Urine analysis:    Component Value Date/Time   COLORURINE YELLOW 10/10/2019 0754   APPEARANCEUR TURBID (A) 10/10/2019 0754   LABSPEC 1.014 10/10/2019 0754   PHURINE 5.0 10/10/2019 0754   GLUCOSEU NEGATIVE 10/10/2019 0754   HGBUR MODERATE (A) 10/10/2019 0754    BILIRUBINUR Positive 02/01/2020 1507   BILIRUBINUR neg 08/08/2013 0000   KETONESUR NEGATIVE 10/10/2019 0754   PROTEINUR Positive (A) 02/01/2020 1507   PROTEINUR >=300 (A) 10/10/2019 0754   UROBILINOGEN 0.2 02/01/2020 1507   UROBILINOGEN 0.2 09/07/2015 1230   NITRITE Negative 02/01/2020 1507   NITRITE NEGATIVE 10/10/2019 0754   LEUKOCYTESUR Trace (A) 02/01/2020 1507   LEUKOCYTESUR LARGE (A) 10/10/2019 0754    Sepsis Labs: Lactic Acid, Venous    Component Value Date/Time   LATICACIDVEN 1.1 02/07/2019 5409    MICROBIOLOGY: Recent Results (from the past 240 hour(s))  Culture, blood (Routine X 2) w Reflex to ID Panel     Status: None (Preliminary result)   Collection Time: 09/30/22  8:21 PM   Specimen: BLOOD  Result Value Ref Range Status   Specimen Description BLOOD BLOOD RIGHT HAND  Final   Special Requests   Final    BOTTLES DRAWN AEROBIC AND ANAEROBIC Blood Culture adequate volume   Culture   Final    NO GROWTH 3 DAYS Performed at [facility], [place] [address], [place], [place] [address]    Report Status PENDING  Incomplete  Culture, blood (Routine X 2) w Reflex to ID Panel     Status: None (Preliminary result)   Collection Time: 09/30/22  8:22 PM   Specimen: BLOOD  Result Value Ref Range Status   Specimen Description BLOOD LEFT ANTECUBITAL  Final   Special Requests   Final    BOTTLES DRAWN AEROBIC AND ANAEROBIC Blood Culture adequate volume   Culture   Final    NO GROWTH 3 DAYS Performed at [facility]  Lab, [address] [place], [place] [address]    Report Status PENDING  Incomplete    RADIOLOGY STUDIES/RESULTS: No results found.   LOS: 4 days   Oren Binet, MD  [facility]    To contact the attending provider between 7A-7P or the covering provider during after hours 7P-7A, please log into the web site [email or web address] and access using universal [place] password for that web site. If you do not have the password, please call the  hospital operator.  10/04/2022, 1:08 PM

## 2022-10-04 NOTE — Procedures (Signed)
  Procedure:  R IJ tunneled HD catheter 19cm Preprocedure diagnosis: Diagnoses of Mitral valve mass and Right atrial mass were pertinent to this visit.  Postprocedure diagnosis: same EBL:    minimal Complications:   none immediate  See full dictation in [employer].  Dillard Cannon MD Main # [phone] Pager  [phone] Mobile [phone]

## 2022-10-04 NOTE — Progress Notes (Signed)
    [facility] for Infectious Disease    Date of Admission:  09/30/2022   Total days of antibiotics 5/dapto & cefepime           ID: Heather [name] is a 51 y.o. female with culture negative native MV endocarditis Principal Problem:   Endocarditis Active Problems:   Hypertension associated with diabetes ([place])   ESRD on dialysis ([place])    Subjective: Afebrile. Continues to tolerate abtx without difficulty  No rash, no diarrhea, no dysuria, nor thrush on ROS  Medications:   amLODipine  5 mg Oral Daily   apixaban  5 mg Oral BID   calcium acetate  2,001 mg Oral TID WC   Chlorhexidine Gluconate Cloth  6 each Topical Q0600   Chlorhexidine Gluconate Cloth  6 each Topical Q0600   doxercalciferol  3 mcg Intravenous Q M,W,F-HD   gabapentin  300 mg Oral Q M,W,F   guaiFENesin  1,200 mg Oral BID   insulin aspart  0-6 Units Subcutaneous TID WC   insulin glargine  20 Units Subcutaneous QHS   irbesartan  150 mg Oral Daily   loratadine  10 mg Oral Daily    Objective: Vital signs in last 24 hours: Temp:  [98.2 F (36.8 C)-98.4 F (36.9 C)] 98.4 F (36.9 C) (10/09 1349) Pulse Rate:  [80-93] 86 (10/09 1349) Resp:  [13-16] 14 (10/09 1349) BP: (135-195)/(58-91) 195/91 (10/09 1349) SpO2:  [94 %-98 %] 94 % (10/09 0514) Physical Exam  Constitutional:  oriented to person, place, and time. appears well-developed and well-nourished. No distress.  HENT: Twilight/AT, PERRLA, no scleral icterus Mouth/Throat: Oropharynx is clear and moist. No oropharyngeal exudate.  Cardiovascular: Normal rate, regular rhythm and normal heart sounds. Exam reveals no gallop and no friction rub.  No murmur heard.  Pulmonary/Chest: Effort normal and breath sounds normal. No respiratory distress.  has no wheezes.  Neck = supple, no nuchal rigidity Abdominal: Soft. Bowel sounds are normal.  exhibits no distension. There is no tenderness.  Ext: left bka Skin: Skin is warm and dry. No rash noted. No erythema.   Psychiatric: a normal mood and affect.  behavior is normal.    Lab Results Recent Labs    10/02/22 0327  WBC 7.8  HGB 11.1*  HCT 36.9  NA 135  K 3.4*  CL 96*  CO2 26  BUN 19  CREATININE 6.61*    Microbiology: reviewed Studies/Results: No results found.   Assessment/Plan: Culture negative endocarditis = will plan to continue with empiric coverage for cefepime and daptomycin. For transition to HD as outpatient, we will do a dose of vancomycin post her next HD session to see if she tolerates since it is more accessible in the community. Plan to treat for 6 wk. Currently on day 5. Source is possible the hd line  Esrd on HD = has been on line holiday, awaiting new line placement by IR  [facility] for Infectious Diseases Pager: [phone]  10/04/2022, 2:33 PM

## 2022-10-05 ENCOUNTER — Telehealth: Payer: Self-pay

## 2022-10-05 DIAGNOSIS — E1159 Type 2 diabetes mellitus with other circulatory complications: Secondary | ICD-10-CM | POA: Diagnosis not present

## 2022-10-05 DIAGNOSIS — I339 Acute and subacute endocarditis, unspecified: Secondary | ICD-10-CM | POA: Diagnosis not present

## 2022-10-05 DIAGNOSIS — I330 Acute and subacute infective endocarditis: Secondary | ICD-10-CM | POA: Diagnosis not present

## 2022-10-05 DIAGNOSIS — N186 End stage renal disease: Secondary | ICD-10-CM | POA: Diagnosis not present

## 2022-10-05 DIAGNOSIS — I058 Other rheumatic mitral valve diseases: Secondary | ICD-10-CM

## 2022-10-05 DIAGNOSIS — Z992 Dependence on renal dialysis: Secondary | ICD-10-CM | POA: Diagnosis not present

## 2022-10-05 LAB — CULTURE, BLOOD (ROUTINE X 2)
Culture: NO GROWTH
Culture: NO GROWTH
Special Requests: ADEQUATE
Special Requests: ADEQUATE

## 2022-10-05 LAB — RENAL FUNCTION PANEL
Albumin: 2.8 g/dL — ABNORMAL LOW (ref 3.5–5.0)
Anion gap: 12 (ref 5–15)
BUN: 19 mg/dL (ref 6–20)
CO2: 24 mmol/L (ref 22–32)
Calcium: 8.6 mg/dL — ABNORMAL LOW (ref 8.9–10.3)
Chloride: 96 mmol/L — ABNORMAL LOW (ref 98–111)
Creatinine, Ser: 6.02 mg/dL — ABNORMAL HIGH (ref 0.44–1.00)
GFR, Estimated: 8 mL/min — ABNORMAL LOW (ref >60)
Glucose, Bld: 182 mg/dL — ABNORMAL HIGH (ref 70–99)
Phosphorus: 3.4 mg/dL (ref 2.5–4.6)
Potassium: 3.5 mmol/L (ref 3.5–5.1)
Sodium: 132 mmol/L — ABNORMAL LOW (ref 135–145)

## 2022-10-05 LAB — CBC
HCT: 36.6 % (ref 36.0–46.0)
Hemoglobin: 11.1 g/dL — ABNORMAL LOW (ref 12.0–15.0)
MCH: 30.2 pg (ref 26.0–34.0)
MCHC: 30.3 g/dL (ref 30.0–36.0)
MCV: 99.5 fL (ref 80.0–100.0)
Platelets: 223 10*3/uL (ref 150–400)
RBC: 3.68 MIL/uL — ABNORMAL LOW (ref 3.87–5.11)
RDW: 16.4 % — ABNORMAL HIGH (ref 11.5–15.5)
WBC: 7.5 10*3/uL (ref 4.0–10.5)
nRBC: 0 % (ref 0.0–0.2)

## 2022-10-05 LAB — GLUCOSE, CAPILLARY: Glucose-Capillary: 106 mg/dL — ABNORMAL HIGH (ref 70–99)

## 2022-10-05 MED ORDER — TRESIBA FLEXTOUCH 100 UNIT/ML ~~LOC~~ SOPN: 20.0000 [IU] | PEN_INJECTOR | Freq: Every day | SUBCUTANEOUS | 3 refills | Status: DC

## 2022-10-05 MED ORDER — VANCOMYCIN HCL IN DEXTROSE 1-5 GM/200ML-% IV SOLN: 1000.0000 mg | INTRAVENOUS | Status: DC

## 2022-10-05 MED ORDER — SODIUM CHLORIDE 0.9 % IV SOLN: 2.0000 g | INTRAVENOUS | Status: DC

## 2022-10-05 MED ORDER — VANCOMYCIN HCL 2000 MG/400ML IV SOLN
2000.0000 mg | Freq: Once | INTRAVENOUS | Status: AC
  Administered 2022-10-05: 2000 mg via INTRAVENOUS
  Filled 2022-10-05: qty 400

## 2022-10-05 NOTE — Progress Notes (Signed)
[facility] Progress Note   Subjective:    Seen and examined patient at bedside. S/p R TDC placement in IR then HD afterwards 10/9. She tolerated net UF 1.6L with HD yesterday. She will be on ABX for 6 weeks in outpatient. Plan for discharge today.  Objective Vitals:   10/05/22 0100 10/05/22 0140 10/05/22 0500 10/05/22 0810  BP: (!) 159/96 125/75 131/72 (!) 145/73  Pulse: 86 92 86 88  Resp: 15 (!) '23 18 16  '$ Temp: 98 F (36.7 C) (!) 97.5 F (36.4 C) 97.9 F (36.6 C) 99.6 F (37.6 C)  TempSrc:  Oral Oral Oral  SpO2: 100% 100% 98%   Weight: 101.7 kg     Height:       Physical Exam General:WDWN female in NAD Heart:RRR, no mrg Lungs:CTAB, nml WOB on RA Abdomen:soft, NTND Extremities:no LE edema, R BKA Dialysis Access: none   Filed Weights   09/30/22 0958 10/04/22 2015 10/05/22 0100  Weight: 107 kg 103.3 kg 101.7 kg    Intake/Output Summary (Last 24 hours) at 10/05/2022 1054 Last data filed at 10/05/2022 0100 Gross per 24 hour  Intake --  Output 1600 ml  Net -1600 ml    Additional Objective Labs: Basic Metabolic Panel: Recent Labs  Lab 09/30/22 2023 10/02/22 0327 10/03/22 1245 10/05/22 0423  NA 139 135  --  132*  K 3.3* 3.4*  --  3.5  CL 96* 96*  --  96*  CO2 27 26  --  24  GLUCOSE 244* 93  --  182*  BUN 29* 19  --  19  CREATININE 8.92* 6.61*  --  6.02*  CALCIUM 9.6 9.4  --  8.6*  PHOS  --   --  6.1* 3.4   Liver Function Tests: Recent Labs  Lab 09/30/22 2023 10/05/22 0423  AST 15  --   ALT 12  --   ALKPHOS 98  --   BILITOT 0.7  --   PROT 8.1  --   ALBUMIN 3.4* 2.8*   No results for input(s): "LIPASE", "AMYLASE" in the last 168 hours. CBC: Recent Labs  Lab 09/30/22 2023 10/02/22 0327 10/05/22 0423  WBC 9.4 7.8 7.5  HGB 12.8 11.1* 11.1*  HCT 41.2 36.9 36.6  MCV 101.2* 101.4* 99.5  PLT 134* 160 223   Blood Culture    Component Value Date/Time   SDES BLOOD RIGHT HAND 10/03/2022 1952   SPECREQUEST  10/03/2022 1952     BOTTLES DRAWN AEROBIC AND ANAEROBIC Blood Culture adequate volume   CULT  10/03/2022 1952    NO GROWTH < 24 HOURS Performed at [facility], [place] [address], [place], [place] [address]    REPTSTATUS PENDING 10/03/2022 1952    Cardiac Enzymes: Recent Labs  Lab 09/30/22 2023  CKTOTAL 47   CBG: Recent Labs  Lab 10/04/22 0819 10/04/22 1027 10/04/22 1215 10/04/22 1725 10/05/22 0808  GLUCAP 72 63* 105* 93 106*   Iron Studies: No results for input(s): "IRON", "TIBC", "TRANSFERRIN", "FERRITIN" in the last 72 hours. Lab Results  Component Value Date   INR 1.0 03/19/2020   INR 1.1 05/31/2019   INR 1.01 07/11/2018   Studies/Results: IR Fluoro Guide CV Line Right  Result Date: 10/04/2022 CLINICAL DATA:  Renal failure, on line holiday, needs new durable venous access for hemodialysis EXAM: TUNNELED HEMODIALYSIS CATHETER PLACEMENT WITH ULTRASOUND AND FLUOROSCOPIC GUIDANCE TECHNIQUE: The procedure, risks, benefits, and alternatives were explained to the patient. Questions regarding the procedure were encouraged and  answered. The patient understands and consents to the procedure. As antibiotic prophylaxis, cefazolin 2 g was ordered pre-procedure and administered intravenously within one hour of incision.Patency of the right IJ vein was confirmed with ultrasound with image documentation. An appropriate skin site was determined. Region was prepped using maximum barrier technique including cap and mask, sterile gown, sterile gloves, large sterile sheet, and Chlorhexidine as cutaneous antisepsis. The region was infiltrated locally with 1% lidocaine. Intravenous Fentanyl 74mg and Versed '1mg'$  were administered as conscious sedation during continuous monitoring of the patient's level of consciousness and physiological / cardiorespiratory status by the radiology RN, with a total moderate sedation time of 13 minutes. Under real-time ultrasound guidance, the right IJ vein was accessed with a 21 gauge  micropuncture needle; the needle tip within the vein was confirmed with ultrasound image documentation. Needle exchanged over the 018 guidewire for transitional dilator, which allowed advancement of a Benson wire into the IVC. Over this, an MPA catheter was advanced. A Palindrome 19 hemodialysis catheter was tunneled from the right anterior chest wall approach to the right IJ dermatotomy site. The MPA catheter was exchanged over an Amplatz wire for serial vascular dilators which allow placement of a peel-away sheath, through which the catheter was advanced under intermittent fluoroscopy, positioned with its tips in the proximal and midright atrium. Spot chest radiograph confirms good catheter position. No pneumothorax. Catheter was flushed and primed per protocol. Catheter secured externally with O Prolene sutures. The right IJ dermatotomy site was closed with Dermabond. COMPLICATIONS: COMPLICATIONS None immediate FLUOROSCOPY: Radiation Exposure Index (as provided by the fluoroscopic device): 10 mGy air Kerma COMPARISON:  None Available. IMPRESSION: 1. Technically successful placement of tunneled right IJ hemodialysis catheter with ultrasound and fluoroscopic guidance. Ready for routine use. ACCESS: Remains approachable for percutaneous intervention as needed. Electronically Signed   By: DLucrezia EuropeM.D.   On: 10/04/2022 16:15   IR UK[place]Guide Vasc Access Right  Result Date: 10/04/2022 CLINICAL DATA:  Renal failure, on line holiday, needs new durable venous access for hemodialysis EXAM: TUNNELED HEMODIALYSIS CATHETER PLACEMENT WITH ULTRASOUND AND FLUOROSCOPIC GUIDANCE TECHNIQUE: The procedure, risks, benefits, and alternatives were explained to the patient. Questions regarding the procedure were encouraged and answered. The patient understands and consents to the procedure. As antibiotic prophylaxis, cefazolin 2 g was ordered pre-procedure and administered intravenously within one hour of incision.Patency of the  right IJ vein was confirmed with ultrasound with image documentation. An appropriate skin site was determined. Region was prepped using maximum barrier technique including cap and mask, sterile gown, sterile gloves, large sterile sheet, and Chlorhexidine as cutaneous antisepsis. The region was infiltrated locally with 1% lidocaine. Intravenous Fentanyl 560m and Versed '1mg'$  were administered as conscious sedation during continuous monitoring of the patient's level of consciousness and physiological / cardiorespiratory status by the radiology RN, with a total moderate sedation time of 13 minutes. Under real-time ultrasound guidance, the right IJ vein was accessed with a 21 gauge micropuncture needle; the needle tip within the vein was confirmed with ultrasound image documentation. Needle exchanged over the 018 guidewire for transitional dilator, which allowed advancement of a Benson wire into the IVC. Over this, an MPA catheter was advanced. A Palindrome 19 hemodialysis catheter was tunneled from the right anterior chest wall approach to the right IJ dermatotomy site. The MPA catheter was exchanged over an Amplatz wire for serial vascular dilators which allow placement of a peel-away sheath, through which the catheter was advanced under intermittent fluoroscopy, positioned with its tips  in the proximal and midright atrium. Spot chest radiograph confirms good catheter position. No pneumothorax. Catheter was flushed and primed per protocol. Catheter secured externally with O Prolene sutures. The right IJ dermatotomy site was closed with Dermabond. COMPLICATIONS: COMPLICATIONS None immediate FLUOROSCOPY: Radiation Exposure Index (as provided by the fluoroscopic device): 10 mGy air Kerma COMPARISON:  None Available. IMPRESSION: 1. Technically successful placement of tunneled right IJ hemodialysis catheter with ultrasound and fluoroscopic guidance. Ready for routine use. ACCESS: Remains approachable for percutaneous  intervention as needed. Electronically Signed   By: Lucrezia Europe M.D.   On: 10/04/2022 16:15    Medications:  [START ON 10/06/2022] ceFEPime (MAXIPIME) IV     [START ON 10/06/2022] vancomycin      amLODipine  5 mg Oral Daily   apixaban  5 mg Oral BID   calcium acetate  2,001 mg Oral TID WC   Chlorhexidine Gluconate Cloth  6 each Topical Q0600   Chlorhexidine Gluconate Cloth  6 each Topical Q0600   doxercalciferol  3 mcg Intravenous Q M,W,F-HD   gabapentin  300 mg Oral Q M,W,F   guaiFENesin  1,200 mg Oral BID   insulin aspart  0-6 Units Subcutaneous TID WC   insulin glargine  20 Units Subcutaneous QHS   irbesartan  150 mg Oral Daily   loratadine  10 mg Oral Daily    Dialysis Orders: [facility] [place] [facility]  MWF.  3 hours 45 minutes.  2K/2 Ca  400/700 flow rates.  EDW 101.7 kg.     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 TDC removed by IR 10/7 for line holiday. S/p R TDC placement 10/9 in IR.  Appreciate their assistance.  BC from 10/5 NGTD. Repeat BC from 10/8 NGTD so far-awaiting final report. ABX per ID: plan for patient to be on Vanc 1GM IV and Cefepime 1GM with HD X 6-weeks. Spoke with pharmacy: end date is 11/12/22. CTS evaluated and deemed no indication for surgery.  Cont medical management and repeat ECHO in 2-3 weeks.  2. ESRD - on HD MWF. S/p R TDC placement then HD yesterday. She will resume HD in outpatient tomorrow. 3. Anemia of CKD- Hgb 11.1. No ESA indicated. Hold IV iron with acute infection and on IV ABXs. 4. Secondary hyperparathyroidism - Ca at goal. Phos mildly elevated. Continue home binders and VDRA.   5. HTN/volume - BP stable with HD. Euvolemic on exam, continue UF as tolerated.  6. Nutrition - Renal diet w/fluid restrictions. 7. Dispo - okay for discharge from renal standpoint.   Heather Poet, NP [facility] 10/05/2022,10:54 AM  LOS: 5 days

## 2022-10-05 NOTE — Telephone Encounter (Signed)
Per Dr. Baxter Flattery, patient needs HSFU appointment with her in 4-5 weeks. Called patient to schedule, no answer. Left HIPAA compliant voicemail requesting callback.   Will have front desk follow-up.   Beryle Flock, RN

## 2022-10-05 NOTE — Progress Notes (Signed)
Pharmacy Antibiotic Note  Heather [name] is a 51 y.o. 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  Pharmacy has been consulted for Vancomycin + Cefepime dosing. Plans noted for 6 weeks treatment. She has an allergy of nausea/vomiting with vancomycin and plans are to trial a dose.   The patient is ESRD-MWF - The HD catheter was replaced 10/9,  HD done 10/9 PM,  Vancomycin 2000 mg loading dose post HD. No report of nausea and vomiting at this time. Afebrile, WBC within normal.      Plan: - Vancomycin 1000 mg IV post every HD-MWF - Cefepime 1g/24h  - Will continue to follow HD schedule/duration, culture results, LOT, and antibiotic de-escalation plans   Height: '5\' 9"'$  (175.3 cm) Weight: 101.7 kg (224 lb 3.3 oz) IBW/kg (Calculated) : 66.2 kg  Temp (24hrs), Avg:98.2 F (36.8 C), Min:97.5 F (36.4 C), Max:99.6 F (37.6 C)  Recent Labs  Lab 09/30/22 1110 09/30/22 2023 10/02/22 0327 10/05/22 0423  WBC  --  9.4 7.8 7.5  CREATININE 8.00* 8.92* 6.61* 6.02*     Estimated Creatinine Clearance: 13.9 mL/min (A) (by C-G formula based on SCr of 6.02 mg/dL (H)).    Allergies  Allergen Reactions   Ibuprofen Other (See Comments)    CKD stage 3. Should avoid.   Vancomycin Nausea And Vomiting    Severe N/V - avoid if possible   Dilaudid [Hydromorphone Hcl] Itching and Other (See Comments)    Can take with Benadryl.   Iodinated Contrast Media Rash   Tramadol Itching    Antimicrobials this admission: Daptomycin 10/5 >>10/9 Cefepime 10/5 >> Vanco 10/9>>   Microbiology results: 10/7 blood Cx: ngtd x4 days 10/8 blood Cx: ngtd <24 h  Nicole Cella, RPh Clinical Pharmacist [phone] **Pharmacist phone directory can now be found on [email or web address] (PW [email or web address]).  Listed under [place].

## 2022-10-05 NOTE — Care Management (Signed)
10-05-22 1225 Case Manager received confirmation that the patient will be receiving antibiotics with Hemodialysis. No home needs identified at this time.

## 2022-10-05 NOTE — Progress Notes (Signed)
[facility] for Infectious Disease    Date of Admission:  09/30/2022   Total days of antibiotics 6   ID: Heather [name] is a 51 y.o. 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 Tolerated vancomycin. Had some burning sensation in PIV during infusion but now subsided  Medications:   amLODipine  5 mg Oral Daily   apixaban  5 mg Oral BID   calcium acetate  2,001 mg Oral TID WC   Chlorhexidine Gluconate Cloth  6 each Topical Q0600   Chlorhexidine Gluconate Cloth  6 each Topical Q0600   doxercalciferol  3 mcg Intravenous Q M,W,F-HD   gabapentin  300 mg Oral Q M,W,F   guaiFENesin  1,200 mg Oral BID   insulin aspart  0-6 Units Subcutaneous TID WC   insulin glargine  20 Units Subcutaneous QHS   irbesartan  150 mg Oral Daily   loratadine  10 mg Oral Daily    Objective: Vital signs in last 24 hours: Temp:  [97.5 F (36.4 C)-99.6 F (37.6 C)] 99.6 F (37.6 C) (10/10 0810) Pulse Rate:  [79-93] 88 (10/10 0810) Resp:  [13-23] 16 (10/10 0810) BP: (93-195)/(56-109) 145/73 (10/10 0810) SpO2:  [96 %-100 %] 98 % (10/10 0500) Weight:  [101.7 kg-103.3 kg] 101.7 kg (10/10 0100)  Physical Exam  Constitutional:  oriented to person, place, and time. appears well-developed and well-nourished. No distress.  HENT: [place]/AT, PERRLA, no scleral icterus Mouth/Throat: Oropharynx is clear and moist. No oropharyngeal exudate.  Cardiovascular: Normal rate, regular rhythm and normal heart sounds. Exam reveals no gallop and no friction rub.  No murmur heard.  Chest wall: hd site is c/d/i Pulmonary/Chest: Effort normal and breath sounds normal. No respiratory distress.  has no wheezes.  Neck = supple, no nuchal rigidity Abdominal: Soft. Bowel sounds are  normal.  exhibits no distension. There is no tenderness.  Lymphadenopathy: no cervical adenopathy. No axillary adenopathy Neurological: alert and oriented to person, place, and time.  Skin: Skin is warm and dry. No rash noted. No erythema.  Psychiatric: a normal mood and affect.  behavior is normal.    Lab Results Recent Labs    10/05/22 0423  WBC 7.5  HGB 11.1*  HCT 36.6  NA 132*  K 3.5  CL 96*  CO2 24  BUN 19  CREATININE 6.02*   Liver Panel Recent Labs    10/05/22 0423  ALBUMIN 2.8*   Microbiology: 10/8 blood cx ngtd Studies/Results: IR Fluoro Guide CV Line Right  Result Date: 10/04/2022 CLINICAL DATA:  Renal failure, on line holiday, needs new durable venous access for hemodialysis EXAM: TUNNELED HEMODIALYSIS CATHETER PLACEMENT WITH ULTRASOUND AND FLUOROSCOPIC GUIDANCE TECHNIQUE: The procedure, risks, benefits, and alternatives were explained to the patient. Questions regarding the procedure were encouraged and answered. The patient understands and consents to the procedure. As antibiotic prophylaxis, cefazolin 2 g was ordered pre-procedure and administered intravenously within one hour of incision.Patency of the right IJ vein was confirmed with ultrasound with image documentation. An appropriate skin site was determined. Region was prepped using maximum barrier technique including cap and mask, sterile gown, sterile gloves, large sterile sheet, and Chlorhexidine as cutaneous antisepsis. The region was infiltrated locally with 1% lidocaine. Intravenous Fentanyl 33mg and Versed '1mg'$  were administered as  conscious sedation during continuous monitoring of the patient's level of consciousness and physiological / cardiorespiratory status by the radiology RN, with a total moderate sedation time of 13 minutes. Under real-time ultrasound guidance, the right IJ vein was accessed with a 21 gauge micropuncture needle; the needle tip within the vein was confirmed with ultrasound image  documentation. Needle exchanged over the 018 guidewire for transitional dilator, which allowed advancement of a Benson wire into the IVC. Over this, an MPA catheter was advanced. A Palindrome 19 hemodialysis catheter was tunneled from the right anterior chest wall approach to the right IJ dermatotomy site. The MPA catheter was exchanged over an Amplatz wire for serial vascular dilators which allow placement of a peel-away sheath, through which the catheter was advanced under intermittent fluoroscopy, positioned with its tips in the proximal and midright atrium. Spot chest radiograph confirms good catheter position. No pneumothorax. Catheter was flushed and primed per protocol. Catheter secured externally with O Prolene sutures. The right IJ dermatotomy site was closed with Dermabond. COMPLICATIONS: COMPLICATIONS None immediate FLUOROSCOPY: Radiation Exposure Index (as provided by the fluoroscopic device): 10 mGy air Kerma COMPARISON:  None Available. IMPRESSION: 1. Technically successful placement of tunneled right IJ hemodialysis catheter with ultrasound and fluoroscopic guidance. Ready for routine use. ACCESS: Remains approachable for percutaneous intervention as needed. Electronically Signed   By: Lucrezia Europe M.D.   On: 10/04/2022 16:15   IR US Guide Vasc Access Right  Result Date: 10/04/2022 CLINICAL DATA:  Renal failure, on line holiday, needs new durable venous access for hemodialysis EXAM: TUNNELED HEMODIALYSIS CATHETER PLACEMENT WITH ULTRASOUND AND FLUOROSCOPIC GUIDANCE TECHNIQUE: The procedure, risks, benefits, and alternatives were explained to the patient. Questions regarding the procedure were encouraged and answered. The patient understands and consents to the procedure. As antibiotic prophylaxis, cefazolin 2 g was ordered pre-procedure and administered intravenously within one hour of incision.Patency of the right IJ vein was confirmed with ultrasound with image documentation. An appropriate skin  site was determined. Region was prepped using maximum barrier technique including cap and mask, sterile gown, sterile gloves, large sterile sheet, and Chlorhexidine as cutaneous antisepsis. The region was infiltrated locally with 1% lidocaine. Intravenous Fentanyl 20mg and Versed '1mg'$  were administered as conscious sedation during continuous monitoring of the patient's level of consciousness and physiological / cardiorespiratory status by the radiology RN, with a total moderate sedation time of 13 minutes. Under real-time ultrasound guidance, the right IJ vein was accessed with a 21 gauge micropuncture needle; the needle tip within the vein was confirmed with ultrasound image documentation. Needle exchanged over the 018 guidewire for transitional dilator, which allowed advancement of a Benson wire into the IVC. Over this, an MPA catheter was advanced. A Palindrome 19 hemodialysis catheter was tunneled from the right anterior chest wall approach to the right IJ dermatotomy site. The MPA catheter was exchanged over an Amplatz wire for serial vascular dilators which allow placement of a peel-away sheath, through which the catheter was advanced under intermittent fluoroscopy, positioned with its tips in the proximal and midright atrium. Spot chest radiograph confirms good catheter position. No pneumothorax. Catheter was flushed and primed per protocol. Catheter secured externally with O Prolene sutures. The right IJ dermatotomy site was closed with Dermabond. COMPLICATIONS: COMPLICATIONS None immediate FLUOROSCOPY: Radiation Exposure Index (as provided by the fluoroscopic device): 10 mGy air Kerma COMPARISON:  None Available. IMPRESSION: 1. Technically successful placement of tunneled right IJ hemodialysis catheter with ultrasound and fluoroscopic guidance. Ready for routine use. ACCESS: Remains approachable for  percutaneous intervention as needed. Electronically Signed   By: Lucrezia Europe M.D.   On: 10/04/2022 16:15      Assessment/Plan: Culture negative endocarditis = plan for 6 wk of vancomycin 1gm post hd plus cefepime 2gm post hd through nov 17th 2023. Currently on day 6.  Vancomycin related phlebitis = can see with infusions, as we transition to HD line, she will not likely have these symptoms. Plan to continue with vancomycin.  We will see back in the ID clinic in 4-5 wk  [facility] Pager: [phone]  10/05/2022, 12:42 PM

## 2022-10-05 NOTE — Progress Notes (Signed)
PHARMACY CONSULT NOTE FOR:  OUTPATIENT  PARENTERAL ANTIBIOTIC THERAPY (OPAT)  Informational only -  the patient will receive outpatient antibiotics at her hemodialysis center.  Indication: Culture negative MV IE Regimen: Vancomycin 1g/HD-MWF + Cefepime 2g/HD-MWF End date: 11/12/22  IV antibiotic discharge orders are pended. To discharging provider:  please sign these orders via discharge navigator,  Select New Orders & click on the button choice - Manage This Unsigned Work.     Thank you for allowing pharmacy to be a part of this patient's care.  Alycia Rossetti, PharmD, BCPS Infectious Diseases Clinical Pharmacist 10/05/2022 11:00 AM   **Pharmacist phone directory can now be found on [email or web address] (PW [email or web address]).  Listed under [facility].

## 2022-10-05 NOTE — Progress Notes (Signed)
D/C order noted. Contacted [facility] to advise clinic of pt's d/c today and that pt will resume care tomorrow. Clinic also advised pt to need iv abx with HD and [facility] will send orders.   Melven Sartorius Renal Navigator [phone]

## 2022-10-05 NOTE — Progress Notes (Signed)
   10/05/22 0100  Vitals  Temp 98 F (36.7 C)  BP (!) 159/96  MAP (mmHg) 114  Pulse Rate 86  ECG Heart Rate 87  Resp 15  Post Treatment  Dialyzer Clearance Heavily streaked  Duration of HD Treatment -hour(s) 4 hour(s)  Liters Processed 95.9  Fluid Removed 1600 mL  Tolerated HD Treatment Yes   Not able to met UFG due to low bp's, TX fin.

## 2022-10-05 NOTE — Discharge Summary (Signed)
PATIENT DETAILS Name: Heather [name] Age: 51 y.o. Sex: female Date of Birth: Apr 04, 1971 MRN: [id number]. Admitting Physician: Lequita Halt, MD [id number], [name], MD  Admit Date: 09/30/2022 Discharge date: 10/05/2022  Recommendations for Outpatient Follow-up:  Follow up with PCP in 1-2 weeks Please obtain CMP/CBC in one week Vanco/cefepime with hemodialysis x6 weeks. Ensure follow-up with infectious disease Repeat echo in 2-3 weeks to assess response to treatment.   Admitted From:  Home  Disposition: Home   Discharge Condition: fair  CODE STATUS:   Code Status: Full Code   Diet recommendation:  Diet Order             Diet - low sodium heart healthy           Diet Carb Modified           Diet renal/carb modified with fluid restriction Diet-HS Snack? Nothing; Fluid restriction: 1200 mL Fluid; Room service appropriate? Yes; Fluid consistency: Thin  Diet effective now                    Brief Summary: Patient is a 51 y.o.  female with history of ESRD on HD MWF, right BKA, VTE on Eliquis-who was found to have mitral valve vegetation on outpatient TEE-admitted to [facility] for further work-up.   Significant events: 10/5>> outpatient TEE with mitral valve vegetation-admit to [facility].   Significant studies: 10/5>> LOV:FIEPP mitral valve vegetation 2.5 x 0.4 cm.  EF 60%.   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   TDC removed 10/6-line holiday given-TDC replaced on 10/9.   Initially on daptomycin as patient had nausea with vancomycin.  Rechallenged with vancomycin on 10/9 without any recurrence with nausea.  Discussed with ID MD Dr. Graylon Good today-plan on discharge with  vancomycin/cefepime x6 weeks.  CT surgery recommended repeating echo in 2-3 weeks to assess response to treatment   ESRD on HD MWF Nephrology followed closely with plans to continue with cefepime/vancomycin with HD.   Normocytic anemia Likely due to CKD Follow CBC EPO/iron defer to nephrology service   HTN BP stable-continue amlodipine, Avapro   History of VTE Eliquis   DM-2 (A1c 7.6 on 10/5) No further hypoglycemic episodes-Lantus decreased to 20 units.   PCP to optimize further.    Peripheral neuropathy Continue Neurontin   Right BKA Stump site benign   Obesity: Estimated body mass index is 34.85 kg/m as calculated from the following:   Height as of this encounter: '5\' 9"'$  (1.753 m).   Weight as of this encounter: 107 kg.     Discharge Diagnoses:  Principal Problem:   Endocarditis Active Problems:   Hypertension associated with diabetes ([place])   ESRD on dialysis [facility])   Discharge Instructions:  Activity:  As tolerated   Discharge Instructions     Call MD for:  difficulty breathing, headache or visual disturbances   Complete by: As directed    Call MD for:  temperature >100.4   Complete by: As directed    Diet - low sodium heart healthy   Complete by: As directed    Diet Carb Modified   Complete by: As directed    Discharge instructions   Complete by: As directed    Follow with Primary MD  Jerline Pain,  [name], MD in 1-2 weeks  You will be given vancomycin/cefepime with hemodialysis for 6 weeks.  Please follow-up with the infectious disease clinic, you will require repeat echocardiogram in 2-3 weeks.  Please get a complete blood count and chemistry panel checked by your Primary MD at your next visit, and again as instructed by your Primary MD.  Get Medicines reviewed and adjusted: Please take all your medications with you for your next visit with your Primary MD  Laboratory/radiological data: Please request your Primary MD to go over all hospital tests  and procedure/radiological results at the follow up, please ask your Primary MD to get all Hospital records sent to his/her office.  In some cases, they will be blood work, cultures and biopsy results pending at the time of your discharge. Please request that your primary care M.D. follows up on these results.  Also Note the following: If you experience worsening of your admission symptoms, develop shortness of breath, life threatening emergency, suicidal or homicidal thoughts you must seek medical attention immediately by calling 911 or calling your MD immediately  if symptoms less severe.  You must read complete instructions/literature along with all the possible adverse reactions/side effects for all the Medicines you take and that have been prescribed to you. Take any new Medicines after you have completely understood and accpet all the possible adverse reactions/side effects.   Do not drive when taking Pain medications or sleeping medications (Benzodaizepines)  Do not take more than prescribed Pain, Sleep and Anxiety Medications. It is not advisable to combine anxiety,sleep and pain medications without talking with your primary care practitioner  Special Instructions: If you have smoked or chewed Tobacco  in the last 2 yrs please stop smoking, stop any regular Alcohol  and or any Recreational drug use.  Wear Seat belts while driving.  Please note: You were cared for by a hospitalist during your hospital stay. Once you are discharged, your primary care physician will handle any further medical issues. Please note that NO REFILLS for any discharge medications will be authorized once you are discharged, as it is imperative that you return to your primary care physician (or establish a relationship with a primary care physician if you do not have one) for your post hospital discharge needs so that they can reassess your need for medications and monitor your lab values.   Increase activity slowly    Complete by: As directed    No dressing needed   Complete by: As directed       Allergies as of 10/05/2022       Reactions   Ibuprofen Other (See Comments)   CKD stage 3. Should avoid.   Dilaudid [hydromorphone Hcl] Itching, Other (See Comments)   Can take with Benadryl.   Iodinated Contrast Media Rash   Tramadol Itching        Medication List     TAKE these medications    amLODipine-olmesartan 5-20 MG tablet Commonly known as: AZOR TAKE 1 TABLET BY MOUTH EVERY DAY   calcium acetate 667 MG capsule Commonly known as: PHOSLO Take 2,001 mg by mouth 3 (three) times daily with meals.   ceFEPIme 2 g in sodium chloride 0.9 % 100 mL Inject 2 g into the vein every Monday, Wednesday, and Friday at 8 PM. Start taking on: October 06, 2022   clindamycin 1 % Swab Commonly known as: CLEOCIN T Apply 1 Application topically.   Eliquis 5 MG Tabs tablet Generic drug: apixaban Take 5 mg  by mouth daily.   fluticasone 50 MCG/ACT nasal spray Commonly known as: FLONASE Place 2 sprays into both nostrils daily.   FreeStyle Libre 14 Day Sensor Misc 1 patch by Does not apply route every 14 (fourteen) days.   gabapentin 300 MG capsule Commonly known as: NEURONTIN TAKE 1 CAPSULE (300 MG TOTAL) BY MOUTH DAILY AS NEEDED. TAKE AFTER DIALYSIS. DO NOT TAKE MORE THAN THREE TIMES WEEKLY. What changed: See the new instructions.   HECTOROL IV Doxercalciferol (Hectorol)   Insulin Pen Needle 29G X 5MM Misc 1 Device by Does not apply route 4 (four) times daily.   ketoconazole 2 % cream Commonly known as: NIZORAL Apply 1 Application topically daily.   MIRCERA IJ Mircera   NovoLOG FlexPen 100 UNIT/ML FlexPen Generic drug: insulin aspart Inject 6 Units into the skin 3 (three) times daily with meals. Sliding scale   Tresiba FlexTouch 100 UNIT/ML FlexTouch Pen Generic drug: insulin degludec Inject 20 Units into the skin at bedtime. What changed: how much to take   vancomycin 1-5  GM/200ML-% Soln Commonly known as: VANCOCIN Inject 200 mLs (1,000 mg total) into the vein every Monday, Wednesday, and Friday with hemodialysis. Start taking on: October 06, 2022               Discharge Care Instructions  (From admission, onward)           Start     Ordered   10/05/22 0000  No dressing needed        10/05/22 1025            Allergies  Allergen Reactions   Ibuprofen Other (See Comments)    CKD stage 3. Should avoid.   Dilaudid [Hydromorphone Hcl] Itching and Other (See Comments)    Can take with Benadryl.   Iodinated Contrast Media Rash   Tramadol Itching     Other Procedures/Studies: IR Fluoro Guide CV Line Right  Result Date: 10/04/2022 CLINICAL DATA:  Renal failure, on line holiday, needs new durable venous access for hemodialysis EXAM: TUNNELED HEMODIALYSIS CATHETER PLACEMENT WITH ULTRASOUND AND FLUOROSCOPIC GUIDANCE TECHNIQUE: The procedure, risks, benefits, and alternatives were explained to the patient. Questions regarding the procedure were encouraged and answered. The patient understands and consents to the procedure. As antibiotic prophylaxis, cefazolin 2 g was ordered pre-procedure and administered intravenously within one hour of incision.Patency of the right IJ vein was confirmed with ultrasound with image documentation. An appropriate skin site was determined. Region was prepped using maximum barrier technique including cap and mask, sterile gown, sterile gloves, large sterile sheet, and Chlorhexidine as cutaneous antisepsis. The region was infiltrated locally with 1% lidocaine. Intravenous Fentanyl 79mg and Versed '1mg'$  were administered as conscious sedation during continuous monitoring of the patient's level of consciousness and physiological / cardiorespiratory status by the radiology RN, with a total moderate sedation time of 13 minutes. Under real-time ultrasound guidance, the right IJ vein was accessed with a 21 gauge micropuncture  needle; the needle tip within the vein was confirmed with ultrasound image documentation. Needle exchanged over the 018 guidewire for transitional dilator, which allowed advancement of a Benson wire into the IVC. Over this, an MPA catheter was advanced. A Palindrome 19 hemodialysis catheter was tunneled from the right anterior chest wall approach to the right IJ dermatotomy site. The MPA catheter was exchanged over an Amplatz wire for serial vascular dilators which allow placement of a peel-away sheath, through which the catheter was advanced under intermittent fluoroscopy, positioned with its tips in  the proximal and midright atrium. Spot chest radiograph confirms good catheter position. No pneumothorax. Catheter was flushed and primed per protocol. Catheter secured externally with O Prolene sutures. The right IJ dermatotomy site was closed with Dermabond. COMPLICATIONS: COMPLICATIONS None immediate FLUOROSCOPY: Radiation Exposure Index (as provided by the fluoroscopic device): 10 mGy air Kerma COMPARISON:  None Available. IMPRESSION: 1. Technically successful placement of tunneled right IJ hemodialysis catheter with ultrasound and fluoroscopic guidance. Ready for routine use. ACCESS: Remains approachable for percutaneous intervention as needed. Electronically Signed   By: Lucrezia Europe M.D.   On: 10/04/2022 16:15   IR US Guide Vasc Access Right  Result Date: 10/04/2022 CLINICAL DATA:  Renal failure, on line holiday, needs new durable venous access for hemodialysis EXAM: TUNNELED HEMODIALYSIS CATHETER PLACEMENT WITH ULTRASOUND AND FLUOROSCOPIC GUIDANCE TECHNIQUE: The procedure, risks, benefits, and alternatives were explained to the patient. Questions regarding the procedure were encouraged and answered. The patient understands and consents to the procedure. As antibiotic prophylaxis, cefazolin 2 g was ordered pre-procedure and administered intravenously within one hour of incision.Patency of the right IJ vein was  confirmed with ultrasound with image documentation. An appropriate skin site was determined. Region was prepped using maximum barrier technique including cap and mask, sterile gown, sterile gloves, large sterile sheet, and Chlorhexidine as cutaneous antisepsis. The region was infiltrated locally with 1% lidocaine. Intravenous Fentanyl 42mg and Versed '1mg'$  were administered as conscious sedation during continuous monitoring of the patient's level of consciousness and physiological / cardiorespiratory status by the radiology RN, with a total moderate sedation time of 13 minutes. Under real-time ultrasound guidance, the right IJ vein was accessed with a 21 gauge micropuncture needle; the needle tip within the vein was confirmed with ultrasound image documentation. Needle exchanged over the 018 guidewire for transitional dilator, which allowed advancement of a Benson wire into the IVC. Over this, an MPA catheter was advanced. A Palindrome 19 hemodialysis catheter was tunneled from the right anterior chest wall approach to the right IJ dermatotomy site. The MPA catheter was exchanged over an Amplatz wire for serial vascular dilators which allow placement of a peel-away sheath, through which the catheter was advanced under intermittent fluoroscopy, positioned with its tips in the proximal and midright atrium. Spot chest radiograph confirms good catheter position. No pneumothorax. Catheter was flushed and primed per protocol. Catheter secured externally with O Prolene sutures. The right IJ dermatotomy site was closed with Dermabond. COMPLICATIONS: COMPLICATIONS None immediate FLUOROSCOPY: Radiation Exposure Index (as provided by the fluoroscopic device): 10 mGy air Kerma COMPARISON:  None Available. IMPRESSION: 1. Technically successful placement of tunneled right IJ hemodialysis catheter with ultrasound and fluoroscopic guidance. Ready for routine use. ACCESS: Remains approachable for percutaneous intervention as needed.  Electronically Signed   By: DLucrezia EuropeM.D.   On: 10/04/2022 16:15   IR Removal Tun Cv Cath W/O FL  Result Date: 10/01/2022 INDICATION: Patient with history of tunneled dialysis catheter last exchanged in IR 12/29/21 by Dr. YKathlene Cote [name] now admitted with bacteremia and request made for removal for line holiday. EXAM: REMOVAL TUNNELED CENTRAL VENOUS CATHETER MEDICATIONS: 10 mL 1% lidocaine ANESTHESIA/SEDATION: None FLUOROSCOPY TIME:  None COMPLICATIONS: None immediate. PROCEDURE: Informed written consent was obtained from the patient after a thorough discussion of the procedural risks, benefits and alternatives. All questions were addressed. Maximal Sterile Barrier Technique was utilized including caps, mask, sterile gowns, sterile gloves, sterile drape, hand hygiene and skin antiseptic. A timeout was performed prior to the initiation of the procedure. The  patient's left chest and catheter was prepped and draped in a normal sterile fashion. Heparin was removed from both ports of catheter. 1% lidocaine was used for local anesthesia. Using gentle blunt dissection the cuff of the catheter was exposed and the catheter was removed in its entirety. Pressure was held till hemostasis was obtained. A sterile dressing was applied. The patient tolerated the procedure well with no immediate complications. IMPRESSION: Successful catheter removal as described above. Read by: Brynda Greathouse PA-C Electronically Signed   By: Markus Daft M.D.   On: 10/01/2022 17:33   ECHO TEE  Result Date: 09/30/2022    TRANSESOPHOGEAL ECHO REPORT   Patient Name:   TAMARI REDWINE Date of Exam: 09/30/2022 Medical Rec #:  [id number]            Height:       69.0 in Accession #:    [id number]           Weight:       236.0 lb Date of Birth:  [birth year]           BSA:          2.217 m Patient Age:    73 years             BP:           88/49 mmHg Patient Gender: F                    HR:           86 bpm. Exam Location:  Inpatient Procedure: 3D  Echo, Transesophageal Echo, Cardiac Doppler and Color Doppler Indications:     I34.8 Other nonrheumatic mitral valve disorders  History:         Patient has prior history of Echocardiogram examinations, most                  recent 05/06/2020. Stroke, Mitral Valve Disease,                  Signs/Symptoms:Shortness of Breath; Risk Factors:Diabetes,                  Hypertension and Dyslipidemia.  Sonographer:     Greer Pickerel Sonographer#2:   Roseanna Rainbow RDCS Referring Phys:  Jerline Pain Diagnosing Phys: Candee Furbish MD PROCEDURE: After discussion of the risks and benefits of a TEE, an informed consent was obtained from the patient. The transesophogeal probe was passed without difficulty through the esophogus of the patient. Imaged were obtained with the patient in a left lateral decubitus position. Sedation performed by different physician. The patient's vital signs; including heart rate, blood pressure, and oxygen saturation; remained stable throughout the procedure. The patient developed no complications during the procedure. IMPRESSIONS  1. Large vegetation on the mitral valve.  2. Left ventricular ejection fraction, by estimation, is 60 to 65%. The left ventricle has normal function.  3. Right ventricular systolic function is normal. The right ventricular size is normal.  4. Left atrial size was mildly dilated. No left atrial/left atrial appendage thrombus was detected.  5. Right atrial size was mildly dilated.  6. A small pericardial effusion is present. The pericardial effusion is circumferential. There is no evidence of cardiac tamponade.  7. 2.4 x 0.4 cm vegetation attached below posterior leaflet. The mitral valve is degenerative. Mild mitral valve regurgitation. No evidence of mitral stenosis. Moderate mitral annular calcification.  8. The aortic valve is normal in structure. Aortic valve  regurgitation is not visualized.  9. Thrombus material on dialysis catheter leaflet tip in the right atrium (chronic seen  on prior study). Conclusion(s)/Recommendation(s): Findings are concerning for vegetation/infective endocarditis as detailed above. Admit to hospital for ID/CT surgery consultation. FINDINGS  Left Ventricle: Left ventricular ejection fraction, by estimation, is 60 to 65%. The left ventricle has normal function. The left ventricular internal cavity size was normal in size. Right Ventricle: The right ventricular size is normal. No increase in right ventricular wall thickness. Right ventricular systolic function is normal. Left Atrium: Left atrial size was mildly dilated. No left atrial/left atrial appendage thrombus was detected. Right Atrium: Right atrial size was mildly dilated. Pericardium: A small pericardial effusion is present. The pericardial effusion is circumferential. There is no evidence of cardiac tamponade. Mitral Valve: 2.4 x 0.4 cm vegetation attached below posterior leaflet. The mitral valve is degenerative in appearance. Moderate mitral annular calcification. A large vegetation is seen on the posterior mitral leaflet. The MV vegetation measures 24 mm x 4 mm. Mild mitral valve regurgitation. No evidence of mitral valve stenosis. MV peak gradient, 6.0 mmHg. The mean mitral valve gradient is 3.0 mmHg. Tricuspid Valve: The tricuspid valve is normal in structure. Tricuspid valve regurgitation is mild. Aortic Valve: The aortic valve is normal in structure. Aortic valve regurgitation is not visualized. Pulmonic Valve: The pulmonic valve was normal in structure. Pulmonic valve regurgitation is trivial. Aorta: The aortic root is normal in size and structure. IAS/Shunts: No atrial level shunt detected by color flow Doppler.  MITRAL VALVE              TRICUSPID VALVE MV Peak grad: 6.0 mmHg    TR Peak grad:   25.4 mmHg MV Mean grad: 3.0 mmHg    TR Vmax:        252.00 cm/s MV Vmax:      1.22 m/s MV Vmean:     86.2 cm/s MR Peak grad: 89.5 mmHg MR Mean grad: 54.0 mmHg MR Vmax:      473.00 cm/s MR Vmean:     345.0  cm/s Candee Furbish MD Electronically signed by Candee Furbish MD Signature Date/Time: 09/30/2022/2:14:15 PM    Final    DG Chest Portable 1 View  Result Date: 09/21/2022 CLINICAL DATA:  Chest pain and feeling sick EXAM: PORTABLE CHEST 1 VIEW COMPARISON:  Radiographs 08/18/2022 FINDINGS: Unchanged left IJ dialysis catheter tip in the right atrium. Stable cardiomegaly. Stable vascular congestion. No focal opacity, pleural effusion, or pneumothorax. Loop recorder. Right axillary stent. IMPRESSION: No change from 08/18/2022. Cardiomegaly and pulmonary vascular congestion. Electronically Signed   By: Placido Sou M.D.   On: 09/21/2022 22:18   ECHOCARDIOGRAM LIMITED  Result Date: 09/14/2022    ECHOCARDIOGRAM LIMITED REPORT   Patient Name:   MARASIA NEWHALL Date of Exam: 09/14/2022 Medical Rec #:  [id number]            Height:       69.0 in Accession #:    [id number]           Weight:       217.0 lb Date of Birth:  [birth year]           BSA:          2.139 m Patient Age:    73 years             BP:           166/100 mmHg Patient Gender: F  HR:           93 bpm. Exam Location:  [address] Procedure: Limited Echo, Cardiac Doppler and Limited Color Doppler Indications:    I51.89 Right atrial Mass  History:        Patient has prior history of Echocardiogram examinations, most                 recent 04/27/2022. Stroke and CKD/ PE/DVT; Risk Factors:Diabetes.                 Pulmonary embolus.  Sonographer:    Marygrace Drought RCS Referring Phys: [place]  1. There is a small mobile mass attached to the mitral valve (0.9 x 0.6 cm), possibly attached to the calcified posterior MV annulus. The lesion is mobile and calcified. Would recommend TEE for further evaluation. The mitral valve is degenerative. No evidence of mitral valve regurgitation. No evidence of mitral stenosis.  2. RA not well visualized.  3. Left ventricular ejection fraction, by estimation, is 65 to 70%. The left  ventricle has normal function. The left ventricle has no regional wall motion abnormalities. There is moderate concentric left ventricular hypertrophy. Indeterminate diastolic filling due to E-A fusion.  4. Right ventricular systolic function is normal. The right ventricular size is normal.  5. A small pericardial effusion is present. The pericardial effusion is posterior to the left ventricle.  6. The aortic valve is tricuspid. There is mild calcification of the aortic valve. There is mild thickening of the aortic valve. Aortic valve regurgitation is not visualized. Aortic valve sclerosis is present, with no evidence of aortic valve stenosis. FINDINGS  Left Ventricle: Left ventricular ejection fraction, by estimation, is 65 to 70%. The left ventricle has normal function. The left ventricle has no regional wall motion abnormalities. The left ventricular internal cavity size was normal in size. There is  moderate concentric left ventricular hypertrophy. Indeterminate diastolic filling due to E-A fusion. Right Ventricle: The right ventricular size is normal. No increase in right ventricular wall thickness. Right ventricular systolic function is normal. Left Atrium: Left atrial size was normal in size. Right Atrium: Right atrial size was not well visualized. Pericardium: A small pericardial effusion is present. The pericardial effusion is posterior to the left ventricle. Presence of epicardial fat layer. Mitral Valve: There is a small mobile mass attached to the mitral valve (0.9 x 0.6 cm), possibly attached to the calcified posterior MV annulus. The lesion is mobile and calcified. Would recommend TEE for further evaluation. The mitral valve is degenerative in appearance. Mild to moderate mitral annular calcification. No evidence of mitral valve stenosis. Aortic Valve: The aortic valve is tricuspid. There is mild calcification of the aortic valve. There is mild thickening of the aortic valve. Aortic valve regurgitation  is not visualized. Aortic valve sclerosis is present, with no evidence of aortic valve stenosis. Pulmonic Valve: The pulmonic valve was grossly normal. Pulmonic valve regurgitation is not visualized. No evidence of pulmonic stenosis. Aorta: The aortic root and ascending aorta are structurally normal, with no evidence of dilitation. LEFT VENTRICLE PLAX 2D LVIDd:         3.30 cm LVIDs:         2.40 cm LV PW:         1.10 cm LV IVS:        1.50 cm LVOT diam:     2.00 cm LVOT Area:     3.14 cm  RIGHT VENTRICLE RV Basal diam:  3.30 cm RVSP:           18.8 mmHg LEFT ATRIUM         Index       RIGHT ATRIUM           Index LA diam:    3.90 cm 1.82 cm/m  RA Pressure: 3.00 mmHg                                 RA Area:     13.10 cm                                 RA Volume:   29.90 ml  13.98 ml/m   AORTA Ao Root diam: 2.80 cm Ao Asc diam:  3.00 cm MITRAL VALVE                TRICUSPID VALVE MV Area (PHT):              TR Peak grad:   15.8 mmHg MV Decel Time:              TR Vmax:        199.00 cm/s MV E velocity: 113.00 cm/s  Estimated RAP:  3.00 mmHg MV A velocity: 158.00 cm/s  RVSP:           18.8 mmHg MV E/A ratio:  0.72                             SHUNTS                             Systemic Diam: 2.00 cm Eleonore Chiquito MD Electronically signed by Eleonore Chiquito MD Signature Date/Time: 09/14/2022/4:22:29 PM    Final      TODAY-DAY OF DISCHARGE:  Subjective:   Heather [name] today has no headache,no chest abdominal pain,no new weakness tingling or numbness, feels much better wants to go home today.   Objective:   Blood pressure (!) 145/73, pulse 88, temperature 99.6 F (37.6 C), temperature source Oral, resp. rate 16, height '5\' 9"'$  (1.753 m), weight 101.7 kg, last menstrual period 09/10/2021, SpO2 98 %.  Intake/Output Summary (Last 24 hours) at 10/05/2022 1025 Last data filed at 10/05/2022 0100 Gross per 24 hour  Intake --  Output 1600 ml  Net -1600 ml   Filed Weights   09/30/22 0958 10/04/22 2015  10/05/22 0100  Weight: 107 kg 103.3 kg 101.7 kg    Exam: Awake Alert, Oriented *3, No new F.N deficits, Normal affect [place].AT,PERRAL Supple Neck,No JVD, No cervical lymphadenopathy appriciated.  Symmetrical Chest wall movement, Good air movement bilaterally, CTAB RRR,No Gallops,Rubs or new Murmurs, No Parasternal Heave +ve B.Sounds, Abd Soft, Non tender, No organomegaly appriciated, No rebound -guarding or rigidity. No Cyanosis, Clubbing or edema, No new Rash or bruise   PERTINENT RADIOLOGIC STUDIES: IR Fluoro Guide CV Line Right  Result Date: 10/04/2022 CLINICAL DATA:  Renal failure, on line holiday, needs new durable venous access for hemodialysis EXAM: TUNNELED HEMODIALYSIS CATHETER PLACEMENT WITH ULTRASOUND AND FLUOROSCOPIC GUIDANCE TECHNIQUE: The procedure, risks, benefits, and alternatives were explained to the patient. Questions regarding the procedure were encouraged and answered. The patient understands and consents to the procedure. As antibiotic prophylaxis, cefazolin 2 g was ordered pre-procedure and  administered intravenously within one hour of incision.Patency of the right IJ vein was confirmed with ultrasound with image documentation. An appropriate skin site was determined. Region was prepped using maximum barrier technique including cap and mask, sterile gown, sterile gloves, large sterile sheet, and Chlorhexidine as cutaneous antisepsis. The region was infiltrated locally with 1% lidocaine. Intravenous Fentanyl 50mg and Versed '1mg'$  were administered as conscious sedation during continuous monitoring of the patient's level of consciousness and physiological / cardiorespiratory status by the radiology RN, with a total moderate sedation time of 13 minutes. Under real-time ultrasound guidance, the right IJ vein was accessed with a 21 gauge micropuncture needle; the needle tip within the vein was confirmed with ultrasound image documentation. Needle exchanged over the 018 guidewire for  transitional dilator, which allowed advancement of a Benson wire into the IVC. Over this, an MPA catheter was advanced. A Palindrome 19 hemodialysis catheter was tunneled from the right anterior chest wall approach to the right IJ dermatotomy site. The MPA catheter was exchanged over an Amplatz wire for serial vascular dilators which allow placement of a peel-away sheath, through which the catheter was advanced under intermittent fluoroscopy, positioned with its tips in the proximal and midright atrium. Spot chest radiograph confirms good catheter position. No pneumothorax. Catheter was flushed and primed per protocol. Catheter secured externally with O Prolene sutures. The right IJ dermatotomy site was closed with Dermabond. COMPLICATIONS: COMPLICATIONS None immediate FLUOROSCOPY: Radiation Exposure Index (as provided by the fluoroscopic device): 10 mGy air Kerma COMPARISON:  None Available. IMPRESSION: 1. Technically successful placement of tunneled right IJ hemodialysis catheter with ultrasound and fluoroscopic guidance. Ready for routine use. ACCESS: Remains approachable for percutaneous intervention as needed. Electronically Signed   By: DLucrezia EuropeM.D.   On: 10/04/2022 16:15   IR [place] Vasc Access Right  Result Date: 10/04/2022 CLINICAL DATA:  Renal failure, on line holiday, needs new durable venous access for hemodialysis EXAM: TUNNELED HEMODIALYSIS CATHETER PLACEMENT WITH ULTRASOUND AND FLUOROSCOPIC GUIDANCE TECHNIQUE: The procedure, risks, benefits, and alternatives were explained to the patient. Questions regarding the procedure were encouraged and answered. The patient understands and consents to the procedure. As antibiotic prophylaxis, cefazolin 2 g was ordered pre-procedure and administered intravenously within one hour of incision.Patency of the right IJ vein was confirmed with ultrasound with image documentation. An appropriate skin site was determined. Region was prepped using maximum barrier  technique including cap and mask, sterile gown, sterile gloves, large sterile sheet, and Chlorhexidine as cutaneous antisepsis. The region was infiltrated locally with 1% lidocaine. Intravenous Fentanyl 536m and Versed '1mg'$  were administered as conscious sedation during continuous monitoring of the patient's level of consciousness and physiological / cardiorespiratory status by the radiology RN, with a total moderate sedation time of 13 minutes. Under real-time ultrasound guidance, the right IJ vein was accessed with a 21 gauge micropuncture needle; the needle tip within the vein was confirmed with ultrasound image documentation. Needle exchanged over the 018 guidewire for transitional dilator, which allowed advancement of a Benson wire into the IVC. Over this, an MPA catheter was advanced. A Palindrome 19 hemodialysis catheter was tunneled from the right anterior chest wall approach to the right IJ dermatotomy site. The MPA catheter was exchanged over an Amplatz wire for serial vascular dilators which allow placement of a peel-away sheath, through which the catheter was advanced under intermittent fluoroscopy, positioned with its tips in the proximal and midright atrium. Spot chest radiograph confirms good catheter position. No pneumothorax. Catheter was flushed and  primed per protocol. Catheter secured externally with O Prolene sutures. The right IJ dermatotomy site was closed with Dermabond. COMPLICATIONS: COMPLICATIONS None immediate FLUOROSCOPY: Radiation Exposure Index (as provided by the fluoroscopic device): 10 mGy air Kerma COMPARISON:  None Available. IMPRESSION: 1. Technically successful placement of tunneled right IJ hemodialysis catheter with ultrasound and fluoroscopic guidance. Ready for routine use. ACCESS: Remains approachable for percutaneous intervention as needed. Electronically Signed   By: Lucrezia Europe M.D.   On: 10/04/2022 16:15     PERTINENT LAB RESULTS: CBC: Recent Labs    10/05/22 0423   WBC 7.5  HGB 11.1*  HCT 36.6  PLT 223   CMET CMP     Component Value Date/Time   NA 132 (L) 10/05/2022 0423   NA 133 (A) 03/20/2019 0000   K 3.5 10/05/2022 0423   K 4.3 10/16/2013 0000   CL 96 (L) 10/05/2022 0423   CL 100 10/16/2013 0000   CO2 24 10/05/2022 0423   GLUCOSE 182 (H) 10/05/2022 0423   BUN 19 10/05/2022 0423   BUN 41 (A) 03/20/2019 0000   CREATININE 6.02 (H) 10/05/2022 0423   CREATININE 3.05 (H) 09/03/2015 0855   CALCIUM 8.6 (L) 10/05/2022 0423   CALCIUM 8.9 10/16/2013 0000   PROT 8.1 09/30/2022 2023   PROT 132.5 03/19/2013 0000   ALBUMIN 2.8 (L) 10/05/2022 0423   ALBUMIN 3.1 10/16/2013 0000   AST 15 09/30/2022 2023   AST 7 03/19/2013 0000   ALT 12 09/30/2022 2023   ALKPHOS 98 09/30/2022 2023   ALKPHOS 89 03/19/2013 0000   BILITOT 0.7 09/30/2022 2023   BILITOT 0.1 03/19/2013 0000   GFRNONAA 8 (L) 10/05/2022 0423   GFRNONAA 18 (L) 09/03/2015 0855   GFRAA 4 (L) 05/07/2020 0819   GFRAA 20 (L) 09/03/2015 0855    GFR Estimated Creatinine Clearance: 13.9 mL/min (A) (by C-G formula based on SCr of 6.02 mg/dL (H)). No results for input(s): "LIPASE", "AMYLASE" in the last 72 hours. No results for input(s): "CKTOTAL", "CKMB", "CKMBINDEX", "TROPONINI" in the last 72 hours. Invalid input(s): "POCBNP" No results for input(s): "DDIMER" in the last 72 hours. No results for input(s): "HGBA1C" in the last 72 hours. No results for input(s): "CHOL", "HDL", "LDLCALC", "TRIG", "CHOLHDL", "LDLDIRECT" in the last 72 hours. No results for input(s): "TSH", "T4TOTAL", "T3FREE", "THYROIDAB" in the last 72 hours.  Invalid input(s): "FREET3" No results for input(s): "VITAMINB12", "FOLATE", "FERRITIN", "TIBC", "IRON", "RETICCTPCT" in the last 72 hours. Coags: No results for input(s): "INR" in the last 72 hours.  Invalid input(s): "PT" Microbiology: Recent Results (from the past 240 hour(s))  Culture, blood (Routine X 2) w Reflex to ID Panel     Status: None (Preliminary  result)   Collection Time: 09/30/22  8:21 PM   Specimen: BLOOD  Result Value Ref Range Status   Specimen Description BLOOD BLOOD RIGHT HAND  Final   Special Requests   Final    BOTTLES DRAWN AEROBIC AND ANAEROBIC Blood Culture adequate volume   Culture   Final    NO GROWTH 4 DAYS Performed at [facility], [place] [address] [place], [place] [address]    Report Status PENDING  Incomplete  Culture, blood (Routine X 2) w Reflex to ID Panel     Status: None (Preliminary result)   Collection Time: 09/30/22  8:22 PM   Specimen: BLOOD  Result Value Ref Range Status   Specimen Description BLOOD LEFT ANTECUBITAL  Final   Special Requests   Final  BOTTLES DRAWN AEROBIC AND ANAEROBIC Blood Culture adequate volume   Culture   Final    NO GROWTH 4 DAYS Performed at [facility], [place] [address], [place], [place] [address]    Report Status PENDING  Incomplete  Culture, blood (Routine X 2) w Reflex to ID Panel     Status: None (Preliminary result)   Collection Time: 10/03/22  6:58 PM   Specimen: BLOOD RIGHT HAND  Result Value Ref Range Status   Specimen Description BLOOD RIGHT HAND  Final   Special Requests   Final    BOTTLES DRAWN AEROBIC AND ANAEROBIC Blood Culture adequate volume   Culture   Final    NO GROWTH < 24 HOURS Performed at [facility], [place] [address], [place], [place] [address]    Report Status PENDING  Incomplete  Culture, blood (Routine X 2) w Reflex to ID Panel     Status: None (Preliminary result)   Collection Time: 10/03/22  7:52 PM   Specimen: BLOOD RIGHT HAND  Result Value Ref Range Status   Specimen Description BLOOD RIGHT HAND  Final   Special Requests   Final    BOTTLES DRAWN AEROBIC AND ANAEROBIC Blood Culture adequate volume   Culture   Final    NO GROWTH < 24 HOURS Performed at [facility], [place] [address], [place], [place] [address]    Report Status PENDING  Incomplete    FURTHER DISCHARGE INSTRUCTIONS:  Get Medicines reviewed  and adjusted: Please take all your medications with you for your next visit with your Primary MD  Laboratory/radiological data: Please request your Primary MD to go over all hospital tests and procedure/radiological results at the follow up, please ask your Primary MD to get all Hospital records sent to his/her office.  In some cases, they will be blood work, cultures and biopsy results pending at the time of your discharge. Please request that your primary care M.D. goes through all the records of your hospital data and follows up on these results.  Also Note the following: If you experience worsening of your admission symptoms, develop shortness of breath, life threatening emergency, suicidal or homicidal thoughts you must seek medical attention immediately by calling 911 or calling your MD immediately  if symptoms less severe.  You must read complete instructions/literature along with all the possible adverse reactions/side effects for all the Medicines you take and that have been prescribed to you. Take any new Medicines after you have completely understood and accpet all the possible adverse reactions/side effects.   Do not drive when taking Pain medications or sleeping medications (Benzodaizepines)  Do not take more than prescribed Pain, Sleep and Anxiety Medications. It is not advisable to combine anxiety,sleep and pain medications without talking with your primary care practitioner  Special Instructions: If you have smoked or chewed Tobacco  in the last 2 yrs please stop smoking, stop any regular Alcohol  and or any Recreational drug use.  Wear Seat belts while driving.  Please note: You were cared for by a hospitalist during your hospital stay. Once you are discharged, your primary care physician will handle any further medical issues. Please note that NO REFILLS for any discharge medications will be authorized once you are discharged, as it is imperative that you return to your  primary care physician (or establish a relationship with a primary care physician if you do not have one) for your post hospital discharge needs so that they can reassess  your need for medications and monitor your lab values.  Total Time spent coordinating discharge including counseling, education and face to face time equals greater than 30 minutes.  SignedOren Binet 10/05/2022 10:25 AM

## 2022-10-06 ENCOUNTER — Telehealth: Payer: Self-pay

## 2022-10-06 NOTE — TOC Transition Note (Signed)
Transition of care contact from inpatient facility  Date of discharge: 10/05/22 Date of contact: 10/06/22 Method: Attempted Phone Call Spoke to: No Answer  Tried calling patient to discuss transition of care from recent inpatient hospitalization but patient did not pick up the phone. A voicemail was left to reach back out to [facility] for any questions or concerns.   Patient received HD today. Next HD 10/08/22.  Tobie Poet, NP

## 2022-10-06 NOTE — Telephone Encounter (Signed)
Transition Care Management Unsuccessful Follow-up Telephone Call  Date of discharge and from where:  [facility] 10/05/2022  Attempts:  1st Attempt  Reason for unsuccessful TCM follow-up call:  Left voice message

## 2022-10-07 ENCOUNTER — Telehealth: Payer: Self-pay

## 2022-10-07 NOTE — Telephone Encounter (Signed)
Transition Care Management Follow-up Telephone Call Date of discharge and from where: [facility] 10/05/22 How have you been since you were released from the hospital? ok Any questions or concerns? No  Items Reviewed: Did the pt receive and understand the discharge instructions provided? Yes  Medications obtained and verified? Yes  Other? No  Any new allergies since your discharge? No  Dietary orders reviewed? Yes Do you have support at home? Yes   Home Care and Equipment/Supplies: Were home health services ordered? not applicable If so, what is the name of the agency?   Has the agency set up a time to come to the patient's home? not applicable Were any new equipment or medical supplies ordered?  No What is the name of the medical supply agency?  Were you able to get the supplies/equipment? not applicable Do you have any questions related to the use of the equipment or supplies? No  Functional Questionnaire: (I = Independent and D = Dependent) ADLs: I  Bathing/Dressing- I  Meal Prep- I  Eating- I  Maintaining continence- I  Transferring/Ambulation- I  Managing Meds- I  Follow up appointments reviewed:  PCP Hospital f/u appt confirmed? Yes  Scheduled to see Dr Jerline Pain  on 10/08/22 @ 10:40. [facility] f/u appt confirmed? No . Are transportation arrangements needed? No  If their condition worsens, is the pt aware to call PCP or go to the Emergency Dept.? Yes Was the patient provided with contact information for the PCP's office or ED? Yes Was to pt encouraged to call back with questions or concerns? Yes   Charlott Rakes, LPN [facility]  Direct Dial [phone]

## 2022-10-08 ENCOUNTER — Inpatient Hospital Stay: Payer: Medicare Other | Admitting: Family Medicine

## 2022-10-08 LAB — CULTURE, BLOOD (ROUTINE X 2)
Culture: NO GROWTH
Culture: NO GROWTH
Special Requests: ADEQUATE
Special Requests: ADEQUATE

## 2022-10-08 NOTE — Progress Notes (Deleted)
Chief Complaint:  Heather [name] is a 51 y.o. female who presents today for a TCM visit.  Assessment/Plan:  New/Acute Problems: ***  Chronic Problems Addressed Today: No problem-specific Assessment & Plan notes found for this encounter.   Patient has a {Desc; moderate/high:110033} level of medical decision making.     Subjective:  HPI:  Summary of Hospital admission: Reason for admission: Mitral Valve vegetation  Date of admission: 09/30/2022 Date of discharge: 10/05/2022 Date of Interactive contact: 10/07/2022 Summary of Hospital course: Patient presented to the hospital on 01/31/2022 after finding of mitral valve vegetation on outpatient TEE.  Blood cultures drawn which showed no growth.  ID was consulted.  She was started on vancomycin and discharged with 6-week course of vancomycin and cefepime to be administered with dialysis.  CT surgery recommended repeating echo in 2-3 weeks.   Interim history:  ***  ROS: ***, otherwise a complete review of systems was negative.   PMH:  The following were reviewed and entered/updated in epic: 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[place]), nonhemorrhagic, inferior right cerebellum 12/03/2017   left side weakness in leg   Cyst of left ovary 01/12/2018   Diabetic neuropathy, painful ([place]), on low dose Gabapentin 07/13/2013   DM (diabetes mellitus), type 2, uncontrolled, with renal complications 73/53/2992   DM2 (diabetes mellitus, type 2) ([place])    Dysfunction of left eustachian tube, with pusatile tinnitus 11/24/2017   Dyslipidemia associated with type 2 diabetes mellitus ([place]) 06/25/2016   ESRD (end stage renal disease) on dialysis [facility])    On hemodialysis in July 2019 via [facility] then switched to [facility] in Nov 2019.    ESRD with anemia  (HCC)    Fibromyalgia    Gastroparesis due to DM    GERD (gastroesophageal reflux disease)    Headache    Hypertension associated with diabetes ([place]) 02/19/2011   IBS (irritable bowel syndrome)    Left-sided weakness 12/10/2017   .   Leiomyoma of uterus    Pancreatitis    PE (pulmonary thromboembolism) ([place]) 11/04/2019   Pneumonia    Proliferative diabetic retinopathy ([place]) 09/29/2015   Pronation deformity of both feet 06/14/2014   Reactive depression 12/10/2017   .   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[place]) 02/17/2018   Thromboembolism ([place]) 04/05/2018   Upper airway cough syndrome, with recs to stay off ACE and take Pepcid q hs 11/15/2016   Patient Active Problem List   Diagnosis Date Noted   Mitral valve mass    Right atrial mass    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   Shortness of breath 07/02/2022   Thrombus of venous dialysis catheter ([place]) 02/25/2022   ESRD (end stage renal disease) ([place]) 12/29/2021   Displacement of vascular dialysis catheter, initial encounter ([place]) 12/18/2021   S/P BKA (below knee amputation), right ([place]) 03/31/2021   Impaired mobility and ADLs 03/25/2021   CVA (cerebral vascular accident) ([place]) 03/04/2021   Pulmonary embolus ([place]) 03/04/2021   Charcot ankle, right 02/23/2021   Hx of cardiac arrest 11/12/2020   Abnormal CT of the chest 09/26/2020   Allergy, unspecified, initial encounter 09/22/2020   Fluid overload, unspecified 06/10/2020   Other mechanical complication of other cardiac and vascular devices and implants, initial  encounter ([place]) 05/10/2020   Positive ANA (antinuclear antibody) 04/16/2020   ESRD on dialysis [facility])    Hemiplegia and hemiparesis following cerebral infarction affecting left non-dominant side ([place]) 12/29/2018   IBS (irritable bowel syndrome) 08/06/2017   Dyslipidemia associated with type 2 diabetes mellitus ([place]) 06/25/2016    Proliferative diabetic retinopathy ([place]) 09/29/2015   Chronic cough 10/04/2014   B12 deficiency 05/10/2014   Anemia in chronic kidney disease, on chronic dialysis ([place]) 04/26/2014   CKD (chronic kidney disease) stage 5, GFR less than 15 ml/min ([place]) 04/26/2014   Secondary hyperparathyroidism of renal origin ([place]) 11/27/2013   Posterior subcapsular cataract, bilateral 10/18/2013   Diabetes mellitus, type II, insulin dependent ([place]), on CGM, with end organ damage: renal, neuropathy, gastroparesis, retinopathy 02/19/2011   Hypertension associated with diabetes ([place]) 02/19/2011   Gastroparesis due to DM ([place]) 02/19/2011   Past Surgical History:  Procedure Laterality Date   A/V FISTULAGRAM Right 03/03/2020   Procedure: Venogram;  Surgeon: Waynetta Sandy, MD;  Location: [place] [facility];  Service: Cardiovascular;  Laterality: Right;   ARTERY REPAIR Right 02/17/2018   Procedure: EXPLORATION OF RIGHT BRACHIAL ARTERY;  Surgeon: Conrad [place], MD;  Location: [place];  Service: Vascular;  Laterality: Right;   ARTERY REPAIR Right 02/17/2018   Procedure: BRACHIAL ARTERY EXPLORATION AND TRHOMBECTOMY;  Surgeon: Conrad [place], MD;  Location: [place];  Service: Vascular;  Laterality: Right;   AV FISTULA PLACEMENT Left 05/09/2017   Procedure: INSERTION OF ARTERIOVENOUS (AV) GRAFT ARM (ARTEGRAFT);  Surgeon: Conrad Cardiff, MD;  Location: [place];  Service: Vascular;  Laterality: Left;   AV FISTULA PLACEMENT Right 02/17/2018   Procedure: INSERTION OF ARTERIOVENOUS (AV) GORE-TEX GRAFT ARM RIGHT UPPER ARM;  Surgeon: Conrad [place], MD;  Location: [place];  Service: Vascular;  Laterality: Right;   AV FISTULA PLACEMENT Left 10/10/2019   Procedure: INSERTION OF LEFT ARTERIOVENOUS (AV) ARTEGRAFT GRAFT ARM;  Surgeon: Waynetta Sandy, MD;  Location: [place];  Service: Vascular;  Laterality: Left;   AV FISTULA PLACEMENT Right 03/19/2020   Procedure: INSERTION OF ARTERIOVENOUS (AV) GORE-TEX GRAFT IN RIGHT ARM AND  VIABAHN STENT IN RIGHT AXILLARY ARTERY;  Surgeon: Waynetta Sandy, MD;  Location: [place];  Service: Vascular;  Laterality: Right;   [place] Left 08/31/2016   Procedure: BASILIC VEIN TRANSPOSITION FIRST STAGE;  Surgeon: Conrad Goodman, MD;  Location: [place];  Service: Vascular;  Laterality: Left;   BRONCHIAL WASHINGS  09/17/2021   Procedure: BRONCHIAL WASHINGS;  Surgeon: Collene Gobble, MD;  Location: [facility];  Service: Cardiopulmonary;;   CENTRAL VENOUS CATHETER INSERTION Left 07/24/2018   Procedure: INSERTION CENTRAL LINE ADULT;  Surgeon: Conrad [place], MD;  Location: [place];  Service: Vascular;  Laterality: Left;   EYE SURGERY     secondary to diabetic retinopathy    FOOT SURGERY Right    t"ook bone out- maybe hammer toe"   INSERTION OF DIALYSIS CATHETER Left 07/24/2018   Procedure: INSERTION OF TUNNELED DIALYSIS CATHETER;  Surgeon: Conrad [place], MD;  Location: [place];  Service: Vascular;  Laterality: Left;   IR FLUORO GUIDE CV LINE LEFT  12/19/2021   IR FLUORO GUIDE CV LINE LEFT  12/29/2021   IR FLUORO GUIDE CV LINE RIGHT  07/21/2018   IR FLUORO GUIDE CV LINE RIGHT  06/01/2019   IR FLUORO GUIDE CV LINE RIGHT  05/05/2020   IR FLUORO GUIDE CV LINE RIGHT  10/04/2022   IR REMOVAL TUN CV  CATH W/O FL  01/23/2021   IR REMOVAL TUN CV CATH W/O FL  12/19/2021   IR REMOVAL TUN CV CATH W/O FL  10/01/2022   IR US GUIDE VASC ACCESS LEFT  12/19/2021   IR US GUIDE VASC ACCESS RIGHT  07/21/2018   IR US GUIDE VASC ACCESS RIGHT  06/01/2019   IR US GUIDE VASC ACCESS RIGHT  10/04/2022   LIGATION ARTERIOVENOUS GORTEX GRAFT Right 03/20/2020   Procedure: LIGATION ARM ARTERIOVENOUS GORTEX GRAFT With Patch Angioplasty, Thrombectomy;  Surgeon: Waynetta Sandy, MD;  Location: [place];  Service: Vascular;  Laterality: Right;   LIGATION OF ARTERIOVENOUS  FISTULA Left 05/09/2017   Procedure: LIGATION OF ARTERIOVENOUS  FISTULA;  Surgeon: Conrad [place], MD;  Location: [place];  Service: Vascular;   Laterality: Left;   LOOP RECORDER INSERTION N/A 12/05/2017   Procedure: LOOP RECORDER INSERTION;  Surgeon: Evans Lance, MD;  Location: [place] [facility];  Service: Cardiovascular;  Laterality: N/A;   MYOMECTOMY     REMOVAL OF GRAFT Right 02/17/2018   Procedure: REMOVAL OF RIGHT UPPER ARM ARTERIOVENOUS GRAFT;  Surgeon: Conrad [place], MD;  Location: [place];  Service: Vascular;  Laterality: Right;   REVISION OF ARTERIOVENOUS GORETEX GRAFT Left 07/24/2018   Procedure: REDO ARTERIOVENOUS GORETEX GRAFT;  Surgeon: Conrad [place], MD;  Location: [place];  Service: Vascular;  Laterality: Left;   TEE WITHOUT CARDIOVERSION N/A 12/05/2017   Procedure: TRANSESOPHAGEAL ECHOCARDIOGRAM (TEE);  Surgeon: Sanda Klein, MD;  Location: [place];  Service: Cardiovascular;  Laterality: N/A;   TEE WITHOUT CARDIOVERSION N/A 09/30/2022   Procedure: TRANSESOPHAGEAL ECHOCARDIOGRAM (TEE);  Surgeon: Jerline Pain, MD;  Location: [facility];  Service: Cardiovascular;  Laterality: N/A;   UPPER EXTREMITY VENOGRAPHY Left 07/13/2018   Procedure: UPPER EXTREMITY VENOGRAPHY - Central & Left Arm;  Surgeon: Conrad [place], MD;  Location: [place] [facility];  Service: Cardiovascular;  Laterality: Left;   UPPER EXTREMITY VENOGRAPHY Bilateral 09/10/2019   Procedure: UPPER EXTREMITY VENOGRAPHY;  Surgeon: Waynetta Sandy, MD;  Location: [place] [facility];  Service: Cardiovascular;  Laterality: Bilateral;   UTERINE FIBROID SURGERY     VIDEO BRONCHOSCOPY N/A 09/17/2021   Procedure: VIDEO BRONCHOSCOPY WITHOUT FLUORO;  Surgeon: Collene Gobble, MD;  [facility];  Service: Cardiopulmonary;  Laterality: N/A;   VITRECTOMY Bilateral     Family History  Problem Relation Age of Onset   Colon polyps Mother    Diabetes Mother    Heart murmur Father        history essentially unknown   Hypertension Father    Diabetes Sister    Kidney failure Sister        kidney transplant   Diabetes Brother    Retinal  degeneration Brother    Heart murmur Son    Stroke Paternal Grandmother    Diabetes Sister     Medications- Reconciled discharge and current medications in Epic.  Current Outpatient Medications  Medication Sig Dispense Refill   amLODipine-olmesartan (AZOR) 5-20 MG tablet TAKE 1 TABLET BY MOUTH EVERY DAY 90 tablet 1   calcium acetate (PHOSLO) 667 MG capsule Take 2,001 mg by mouth 3 (three) times daily with meals.     ceFEPIme 2 g in sodium chloride 0.9 % 100 mL Inject 2 g into the vein every Monday, Wednesday, and Friday at 8 PM.     clindamycin (CLEOCIN T) 1 % SWAB Apply 1 Application topically.     Continuous Blood Gluc Sensor (FREESTYLE  LIBRE 14 DAY SENSOR) MISC 1 patch by Does not apply route every 14 (fourteen) days. 6 each 5   Doxercalciferol (HECTOROL IV) Doxercalciferol (Hectorol)     ELIQUIS 5 MG TABS tablet Take 5 mg by mouth daily.     fluticasone (FLONASE) 50 MCG/ACT nasal spray Place 2 sprays into both nostrils daily. (Patient not taking: Reported on 10/07/2022) 18.2 mL 2   gabapentin (NEURONTIN) 300 MG capsule TAKE 1 CAPSULE (300 MG TOTAL) BY MOUTH DAILY AS NEEDED. TAKE AFTER DIALYSIS. DO NOT TAKE MORE THAN THREE TIMES WEEKLY. (Patient taking differently: Take 300 mg by mouth every Monday, Wednesday, and Friday. At 10:30 am) 90 capsule 2   insulin aspart (NOVOLOG FLEXPEN) 100 UNIT/ML FlexPen Inject 6 Units into the skin 3 (three) times daily with meals. Sliding scale 15 mL    insulin degludec (TRESIBA FLEXTOUCH) 100 UNIT/ML FlexTouch Pen Inject 20 Units into the skin at bedtime. 24 mL 3   Insulin Pen Needle 29G X 5MM MISC 1 Device by Does not apply route 4 (four) times daily. 400 each 3   ketoconazole (NIZORAL) 2 % cream Apply 1 Application topically daily.     Methoxy PEG-Epoetin Beta (MIRCERA IJ) Mircera     vancomycin (VANCOCIN) 1-5 GM/200ML-% SOLN Inject 200 mLs (1,000 mg total) into the vein every Monday, Wednesday, and Friday with hemodialysis. 4000 mL    No current  facility-administered medications for this visit.    Allergies-reviewed and updated Allergies  Allergen Reactions   Ibuprofen Other (See Comments)    CKD stage 3. Should avoid.   Dilaudid [Hydromorphone Hcl] Itching and Other (See Comments)    Can take with Benadryl.   Iodinated Contrast Media Rash   Tramadol Itching    Social History   Socioeconomic History   Marital status: Married    Spouse name: Not on file   Number of children: 1   Years of education: college   Highest education level: Not on file  Occupational History   Occupation: customer service rep    Employer: [employer]  Tobacco Use   Smoking status: Never   Smokeless tobacco: Never  Vaping Use   Vaping Use: Never used  Substance and Sexual Activity   Alcohol use: No    Alcohol/week: 0.0 standard drinks of alcohol   Drug use: No   Sexual activity: Not Currently    Partners: Male    Birth control/protection: I.U.D.  Other Topics Concern   Not on file  Social History Narrative   Patient lives with fiance. She has 1 grown son. Works full-time, she Paramedic for attorney.   Caffeine Use: 2 cups daily; sodas occasionally   She has 7 grandchildren   Social Determinants of Radio broadcast assistant Strain: Not on file  Food Insecurity: Not on file  Transportation Needs: Not on file  Physical Activity: Not on file  Stress: Not on file  Social Connections: Not on file        Objective:  Physical Exam: LMP 09/10/2021   Gen: NAD, resting comfortably*** CV: RRR with no murmurs appreciated Pulm: NWOB, CTAB with no crackles, wheezes, or rhonchi GI: Normal bowel sounds present. Soft, Nontender, Nondistended. MSK: No edema, cyanosis, or clubbing noted Skin: Warm, dry Neuro: Grossly normal, moves all extremities Psych: Normal affect and thought content  Summary/Review of work up during hospitalization: ***     Quay Simkin M. Jerline Pain, MD 10/08/2022 7:27 AM

## 2022-10-11 ENCOUNTER — Telehealth: Payer: Self-pay

## 2022-10-11 NOTE — Telephone Encounter (Signed)
Left patient a voice mail to call back to schedule a 4-5 week Hospital follow up per Dr. Baxter Flattery.

## 2022-10-12 ENCOUNTER — Ambulatory Visit (INDEPENDENT_AMBULATORY_CARE_PROVIDER_SITE_OTHER): Payer: Medicare Other | Admitting: Family Medicine

## 2022-10-12 ENCOUNTER — Encounter: Payer: Self-pay | Admitting: Family Medicine

## 2022-10-12 VITALS — BP 155/89 | HR 90 | Temp 97.3°F | Ht 69.0 in | Wt 227.4 lb

## 2022-10-12 DIAGNOSIS — I152 Hypertension secondary to endocrine disorders: Secondary | ICD-10-CM

## 2022-10-12 DIAGNOSIS — R053 Chronic cough: Secondary | ICD-10-CM | POA: Diagnosis not present

## 2022-10-12 DIAGNOSIS — E1159 Type 2 diabetes mellitus with other circulatory complications: Secondary | ICD-10-CM

## 2022-10-12 DIAGNOSIS — H1010 Acute atopic conjunctivitis, unspecified eye: Secondary | ICD-10-CM | POA: Diagnosis not present

## 2022-10-12 DIAGNOSIS — I058 Other rheumatic mitral valve diseases: Secondary | ICD-10-CM

## 2022-10-12 DIAGNOSIS — E119 Type 2 diabetes mellitus without complications: Secondary | ICD-10-CM

## 2022-10-12 DIAGNOSIS — N186 End stage renal disease: Secondary | ICD-10-CM | POA: Diagnosis not present

## 2022-10-12 DIAGNOSIS — Z794 Long term (current) use of insulin: Secondary | ICD-10-CM

## 2022-10-12 DIAGNOSIS — Z124 Encounter for screening for malignant neoplasm of cervix: Secondary | ICD-10-CM

## 2022-10-12 DIAGNOSIS — Z992 Dependence on renal dialysis: Secondary | ICD-10-CM

## 2022-10-12 MED ORDER — AZELASTINE HCL 0.1 % NA SOLN: 2.0000 | Freq: Two times a day (BID) | NASAL | 12 refills | Status: DC

## 2022-10-12 MED ORDER — GABAPENTIN 300 MG PO CAPS: 300.0000 mg | ORAL_CAPSULE | ORAL | 3 refills | Status: DC

## 2022-10-12 MED ORDER — OLOPATADINE HCL 0.2 % OP SOLN: 1.0000 [drp] | Freq: Every day | OPHTHALMIC | 3 refills | Status: DC

## 2022-10-12 NOTE — Assessment & Plan Note (Signed)
Still having significant issues with chronic cough.  She saw pulmonology about 3 months ago and they were concerned about possible worsening allergy symptoms.  She was scheduled to have labs done however this was not done due to her being a difficult stick.  She has tried over-the-counter antihistamines without much improvement.  [name] also been taking her Pepcid without much improvement either.  [name] still having well bit of postnasal drip.  We will start Astelin nasal spray.  Advised her to follow back up with pulmonology soon.

## 2022-10-12 NOTE — Assessment & Plan Note (Signed)
Continue management per nephrology

## 2022-10-12 NOTE — Assessment & Plan Note (Signed)
Continue management per ID and cardiology.  Currently on antibiotics with hemodialysis.

## 2022-10-12 NOTE — Assessment & Plan Note (Signed)
Elevated today though typically well controlled.She has been very labile in the past.  We will continue amlodipine-olmesartan 5-20 once daily.

## 2022-10-12 NOTE — Assessment & Plan Note (Signed)
A1c recently well controlled at 7.6 in the hospital.  Continue her current regimen Tresiba 20 units daily and NovoLog 6 units 3 times daily with meals.  She will follow-up in 3 months and we can recheck A1c at that time.

## 2022-10-12 NOTE — Progress Notes (Signed)
Chief Complaint:  Heather [name] is a 51 y.o. female who presents today for a TCM visit.  Assessment/Plan:  Chronic Problems Addressed Today: Chronic cough Still having significant issues with chronic cough.  She saw pulmonology about 3 months ago and they were concerned about possible worsening allergy symptoms.  She was scheduled to have labs done however this was not done due to her being a difficult stick.  She has tried over-the-counter antihistamines without much improvement.  [name] also been taking her Pepcid without much improvement either.  [name] still having well bit of postnasal drip.  We will start Astelin nasal spray.  Advised her to follow back up with pulmonology soon.  Allergic conjunctivitis Not controlled.  She can try using over-the-counter allergy meds such as Allegra.  Also start Pataday drops.  Place referral to allergy.  [name] having some allergic rhinitis as well which is likely contributing to her chronic cough as above.  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  Currently on antibiotics with hemodialysis.  ESRD on dialysis [facility]) Continue management per nephrology.  Hypertension associated with diabetes ([place]) Elevated today though typically well controlled.She has been very labile in the past.  We will continue amlodipine-olmesartan 5-20 once daily.  Diabetes mellitus, type II, insulin dependent ([place]), on CGM, with end organ damage: renal, neuropathy, gastroparesis, retinopathy A1c recently well controlled at 7.6 in the hospital.  Continue her current regimen Tresiba 20 units daily and NovoLog 6 units 3 times daily with meals.  She will follow-up in 3 months and we can recheck A1c at that time.    Subjective:  HPI:  Summary of Hospital admission: Reason for admission: Mitral valve vegetation Date of admission: 09/30/2022 Date of discharge: 10/05/2022 Date of Interactive contact: 10/07/2022 Summary of Hospital course: Patient  presented to the hospital on 09/30/2022 after finding of a mitral valve vegetation on outpatient TEE.  She was admitted for further work-up.  TEE showed large mitral valve vegetation 2.5 x 0.4 cm.  IV was consulted.  She was initially started on daptomycin however this was switched to vancomycin and she tolerated well.  It was thought that her vegetation is likely due to her HD catheter.  This was replaced on 10/04/2022.  She was discharged home to receive vancomycin and cefepime for 6 weeks with hemodialysis.  Interim history:  She has been doing reasonably well since being discharged. No fevers or chills. She has been compliant with HD and has been getting her IV antibiotics there. She has been tolerating well.   See A/P for status of chronic conditions  ROS: Per HPI, otherwise a complete review of systems was negative.   PMH:  The following were reviewed and entered/updated in epic: 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[place]), nonhemorrhagic, inferior right cerebellum 12/03/2017   left side weakness in leg   Cyst of left ovary 01/12/2018   Diabetic neuropathy, painful ([place]), on low dose Gabapentin 07/13/2013   DM (diabetes mellitus), type 2, uncontrolled, with renal complications 09/32/3557   DM2 (diabetes mellitus, type 2) ([place])    Dysfunction of left eustachian tube, with pusatile tinnitus 11/24/2017   Dyslipidemia associated with type 2 diabetes mellitus ([place]) 06/25/2016   ESRD (end stage renal disease) on dialysis ([place])    On  hemodialysis in July 2019 via [facility] then switched to [facility] in Nov 2019.    ESRD with anemia (HCC)    Fibromyalgia    Gastroparesis due to DM    GERD (gastroesophageal reflux disease)    Headache    Hypertension associated with diabetes ([place]) 02/19/2011   IBS (irritable  bowel syndrome)    Left-sided weakness 12/10/2017   .   Leiomyoma of uterus    Pancreatitis    PE (pulmonary thromboembolism) ([place]) 11/04/2019   Pneumonia    Proliferative diabetic retinopathy ([place]) 09/29/2015   Pronation deformity of both feet 06/14/2014   Reactive depression 12/10/2017   .   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[place]) 02/17/2018   Thromboembolism ([place]) 04/05/2018   Upper airway cough syndrome, with recs to stay off ACE and take Pepcid q hs 11/15/2016   Patient Active Problem List   Diagnosis Date Noted   Allergic conjunctivitis 10/12/2022   Mitral valve mass    Right atrial mass    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   Shortness of breath 07/02/2022   Thrombus of venous dialysis catheter ([place]) 02/25/2022   Displacement of vascular dialysis catheter, initial encounter ([place]) 12/18/2021   S/P BKA (below knee amputation), right ([place]) 03/31/2021   Impaired mobility and ADLs 03/25/2021   CVA (cerebral vascular accident) ([place]) 03/04/2021   Pulmonary embolus ([place]) 03/04/2021   Charcot ankle, right 02/23/2021   Hx of cardiac arrest 11/12/2020   Abnormal CT of the chest 09/26/2020   Allergy, unspecified, initial encounter 09/22/2020   Other mechanical complication of other cardiac and vascular devices and implants, initial encounter ([place]) 05/10/2020   Positive ANA (antinuclear antibody) 04/16/2020   ESRD on dialysis [facility])    Hemiplegia and hemiparesis following cerebral infarction affecting left non-dominant side ([place]) 12/29/2018   IBS (irritable bowel syndrome) 08/06/2017   Dyslipidemia associated with type 2 diabetes mellitus ([place]) 06/25/2016   Proliferative diabetic retinopathy ([place]) 09/29/2015   Chronic cough 10/04/2014   B12 deficiency 05/10/2014   Anemia in chronic kidney disease, on chronic dialysis ([place]) 04/26/2014   CKD (chronic kidney disease) stage 5, GFR less than 15  ml/min (HCC) 04/26/2014   Secondary hyperparathyroidism of renal origin ([name]) 11/27/2013   Posterior subcapsular cataract, bilateral 10/18/2013   Diabetes mellitus, type II, insulin dependent ([place]), on CGM, with end organ damage: renal, neuropathy, gastroparesis, retinopathy 02/19/2011   Hypertension associated with diabetes ([place]) 02/19/2011   Gastroparesis due to DM ([place]) 02/19/2011   Past Surgical History:  Procedure Laterality Date   A/V FISTULAGRAM Right 03/03/2020   Procedure: Venogram;  Surgeon: Waynetta Sandy, MD;  Location: [place] [facility];  Service: Cardiovascular;  Laterality: Right;   ARTERY REPAIR Right 02/17/2018   Procedure: EXPLORATION OF RIGHT BRACHIAL ARTERY;  Surgeon: Conrad [place]clair, MD;  Location: [place];  Service: Vascular;  Laterality: Right;   ARTERY REPAIR Right 02/17/2018   Procedure: BRACHIAL ARTERY EXPLORATION AND TRHOMBECTOMY;  Surgeon: Conrad [place], MD;  Location: [place];  Service: Vascular;  Laterality: Right;   AV FISTULA PLACEMENT Left 05/09/2017   Procedure: INSERTION OF ARTERIOVENOUS (AV) GRAFT ARM (ARTEGRAFT);  Surgeon: Conrad [place], MD;  Location[facility] OR;  Service: Vascular;  Laterality: Left;   AV FISTULA PLACEMENT Right 02/17/2018   Procedure: INSERTION OF ARTERIOVENOUS (AV) GORE-TEX GRAFT ARM RIGHT UPPER ARM;  Surgeon: Conrad Dillingham, MD;  Location: [place];  Service: Vascular;  Laterality: Right;  AV FISTULA PLACEMENT Left 10/10/2019   Procedure: INSERTION OF LEFT ARTERIOVENOUS (AV) ARTEGRAFT GRAFT ARM;  Surgeon: Waynetta Sandy, MD;  Location: [place];  Service: Vascular;  Laterality: Left;   AV FISTULA PLACEMENT Right 03/19/2020   Procedure: INSERTION OF ARTERIOVENOUS (AV) GORE-TEX GRAFT IN RIGHT ARM AND VIABAHN STENT IN RIGHT AXILLARY ARTERY;  Surgeon: Waynetta Sandy, MD;  Location: [place];  Service: Vascular;  Laterality: Right;   [place] Left 08/31/2016   Procedure: BASILIC VEIN TRANSPOSITION FIRST STAGE;   Surgeon: Conrad [place], MD;  Location: [place];  Service: Vascular;  Laterality: Left;   BRONCHIAL WASHINGS  09/17/2021   Procedure: BRONCHIAL WASHINGS;  Surgeon: Collene Gobble, MD;  Location: [facility];  Service: Cardiopulmonary;;   CENTRAL VENOUS CATHETER INSERTION Left 07/24/2018   Procedure: INSERTION CENTRAL LINE ADULT;  Surgeon: Conrad Steger, MD;  Location: [place];  Service: Vascular;  Laterality: Left;   EYE SURGERY     secondary to diabetic retinopathy    FOOT SURGERY Right    t"ook bone out- maybe hammer toe"   INSERTION OF DIALYSIS CATHETER Left 07/24/2018   Procedure: INSERTION OF TUNNELED DIALYSIS CATHETER;  Surgeon: Conrad [place], MD;  Location: [place];  Service: Vascular;  Laterality: Left;   IR FLUORO GUIDE CV LINE LEFT  12/19/2021   IR FLUORO GUIDE CV LINE LEFT  12/29/2021   IR FLUORO GUIDE CV LINE RIGHT  07/21/2018   IR FLUORO GUIDE CV LINE RIGHT  06/01/2019   IR FLUORO GUIDE CV LINE RIGHT  05/05/2020   IR FLUORO GUIDE CV LINE RIGHT  10/04/2022   IR REMOVAL TUN CV CATH W/O FL  01/23/2021   IR REMOVAL TUN CV CATH W/O FL  12/19/2021   IR REMOVAL TUN CV CATH W/O FL  10/01/2022   IR US GUIDE VASC ACCESS LEFT  12/19/2021   IR US GUIDE VASC ACCESS RIGHT  07/21/2018   IR US GUIDE VASC ACCESS RIGHT  06/01/2019   IR US GUIDE VASC ACCESS RIGHT  10/04/2022   LIGATION ARTERIOVENOUS GORTEX GRAFT Right 03/20/2020   Procedure: LIGATION ARM ARTERIOVENOUS GORTEX GRAFT With Patch Angioplasty, Thrombectomy;  Surgeon: Waynetta Sandy, MD;  Location: [place];  Service: Vascular;  Laterality: Right;   LIGATION OF ARTERIOVENOUS  FISTULA Left 05/09/2017   Procedure: LIGATION OF ARTERIOVENOUS  FISTULA;  Surgeon: Conrad Ada, MD;  Location: [place];  Service: Vascular;  Laterality: Left;   LOOP RECORDER INSERTION N/A 12/05/2017   Procedure: LOOP RECORDER INSERTION;  Surgeon: Evans Lance, MD;  Location: [place] [facility];  Service: Cardiovascular;  Laterality: N/A;   MYOMECTOMY     REMOVAL OF GRAFT  Right 02/17/2018   Procedure: REMOVAL OF RIGHT UPPER ARM ARTERIOVENOUS GRAFT;  Surgeon: Conrad Snyder, MD;  Location: [place];  Service: Vascular;  Laterality: Right;   REVISION OF ARTERIOVENOUS GORETEX GRAFT Left 07/24/2018   Procedure: REDO ARTERIOVENOUS GORETEX GRAFT;  Surgeon: Conrad [place]on, MD;  Location: [place];  Service: Vascular;  Laterality: Left;   TEE WITHOUT CARDIOVERSION N/A 12/05/2017   Procedure: TRANSESOPHAGEAL ECHOCARDIOGRAM (TEE);  Surgeon: Sanda Klein, MD;  Location: [place];  Service: Cardiovascular;  Laterality: N/A;   TEE WITHOUT CARDIOVERSION N/A 09/30/2022   Procedure: TRANSESOPHAGEAL ECHOCARDIOGRAM (TEE);  Surgeon: Jerline Pain, MD;  Location: [facility] END[facility]COPY;  Service: Cardiovascular;  Laterality: N/A;   UPPER EXTREMITY VENOGRAPHY Left 07/13/2018   Procedure: UPPER EXTREMITY VENOGRAPHY - Central & Left Arm;  Surgeon: Conrad [place], MD;  Location: [place] [facility];  Service: Cardiovascular;  Laterality: Left;   UPPER EXTREMITY VENOGRAPHY Bilateral 09/10/2019   Procedure: UPPER EXTREMITY VENOGRAPHY;  Surgeon: Waynetta Sandy, MD;  Location: [place] [facility];  Service: Cardiovascular;  Laterality: Bilateral;   UTERINE FIBROID SURGERY     VIDEO BRONCHOSCOPY N/A 09/17/2021   Procedure: VIDEO BRONCHOSCOPY WITHOUT FLUORO;  Surgeon: Collene Gobble, MD;  [facility];  Service: Cardiopulmonary;  Laterality: N/A;   VITRECTOMY Bilateral     Family History  Problem Relation Age of Onset   Colon polyps Mother    Diabetes Mother    Heart murmur Father        history essentially unknown   Hypertension Father    Diabetes Sister    Kidney failure Sister        kidney transplant   Diabetes Brother    Retinal degeneration Brother    Heart murmur Son    Stroke Paternal Grandmother    Diabetes Sister     Medications- Reconciled discharge and current medications in Epic.  Current Outpatient Medications  Medication Sig Dispense Refill    amLODipine-olmesartan (AZOR) 5-20 MG tablet TAKE 1 TABLET BY MOUTH EVERY DAY 90 tablet 1   azelastine (ASTELIN) 0.1 % nasal spray Place 2 sprays into both nostrils 2 (two) times daily. 30 mL 12   calcium acetate (PHOSLO) 667 MG capsule Take 2,001 mg by mouth 3 (three) times daily with meals.     Continuous Blood Gluc Sensor (FREESTYLE LIBRE 14 DAY SENSOR) MISC 1 patch by Does not apply route every 14 (fourteen) days. 6 each 5   Doxercalciferol (HECTOROL IV) Doxercalciferol (Hectorol)     ELIQUIS 5 MG TABS tablet Take 5 mg by mouth daily.     fluticasone (FLONASE) 50 MCG/ACT nasal spray Place 2 sprays into both nostrils daily. 18.2 mL 2   insulin aspart (NOVOLOG FLEXPEN) 100 UNIT/ML FlexPen Inject 6 Units into the skin 3 (three) times daily with meals. Sliding scale 15 mL    insulin degludec (TRESIBA FLEXTOUCH) 100 UNIT/ML FlexTouch Pen Inject 20 Units into the skin at bedtime. 24 mL 3   Insulin Pen Needle 29G X 5MM MISC 1 Device by Does not apply route 4 (four) times daily. 400 each 3   ketoconazole (NIZORAL) 2 % cream Apply 1 Application topically daily.     Methoxy PEG-Epoetin Beta (MIRCERA IJ) Mircera     Olopatadine HCl (PATADAY) 0.2 % SOLN Apply 1 drop to eye daily. 2.5 mL 3   vancomycin (VANCOCIN) 1-5 GM/200ML-% SOLN Inject 200 mLs (1,000 mg total) into the vein every Monday, Wednesday, and Friday with hemodialysis. 4000 mL    ceFEPIme 2 g in sodium chloride 0.9 % 100 mL Inject 2 g into the vein every Monday, Wednesday, and Friday at 8 PM. (Patient not taking: Reported on 10/12/2022)     clindamycin (CLEOCIN T) 1 % SWAB Apply 1 Application topically. (Patient not taking: Reported on 10/12/2022)     [START ON 10/13/2022] gabapentin (NEURONTIN) 300 MG capsule Take 1 capsule (300 mg total) by mouth every Monday, Wednesday, and Friday. At 10:30 am 30 capsule 3   No current facility-administered medications for this visit.    Allergies-reviewed and updated Allergies  Allergen Reactions    Ibuprofen Other (See Comments)    CKD stage 3. Should avoid.   Dilaudid [Hydromorphone Hcl] Itching and Other (See Comments)    Can take with Benadryl.   Iodinated  Contrast Media Rash   Tramadol Itching    Social History   Socioeconomic History   Marital status: Married    Spouse name: Not on file   Number of children: 1   Years of education: college   Highest education level: Not on file  Occupational History   Occupation: customer service rep    Employer: [employer]  Tobacco Use   Smoking status: Never   Smokeless tobacco: Never  Vaping Use   Vaping Use: Never used  Substance and Sexual Activity   Alcohol use: No    Alcohol/week: 0.0 standard drinks of alcohol   Drug use: No   Sexual activity: Not Currently    Partners: Male    Birth control/protection: I.U.D.  Other Topics Concern   Not on file  Social History Narrative   Patient lives with fiance. She has 1 grown son. Works full-time, she Paramedic for attorney.   Caffeine Use: 2 cups daily; sodas occasionally   She has 7 grandchildren   Social Determinants of Radio broadcast assistant Strain: Not on file  Food Insecurity: Not on file  Transportation Needs: Not on file  Physical Activity: Not on file  Stress: Not on file  Social Connections: Not on file        Objective:  Physical Exam: BP (!) 155/89   Pulse 90   Temp (!) 97.3 F (36.3 C) (Temporal)   Ht '5\' 9"'$  (1.753 m)   Wt 227 lb 6.4 oz (103.1 kg)   LMP 09/10/2021   SpO2 98%   BMI 33.58 kg/m   Gen: NAD, resting comfortably HEENT: Conjunctival erythema bilaterally. CV: RRR with no murmurs appreciated Pulm: NWOB, CTAB with no crackles, wheezes, or rhonchi GI: Normal bowel sounds present. Soft, Nontender, Nondistended. MSK: No edema, cyanosis, or clubbing noted Skin: Warm, dry Neuro: Grossly normal, moves all extremities Psych: Normal affect and thought content  Time Spent: 45 minutes of total time was spent on the date of the  encounter performing the following actions: chart review prior to seeing the patient including recent hospitalization.  obtaining history, performing a medically necessary exam, counseling on the treatment plan, placing orders, and documenting in our EHR.       [name]. Jerline Pain, MD 10/12/2022 9:28 AM

## 2022-10-12 NOTE — Patient Instructions (Signed)
It was very nice to see you today!  Please try the nasal spray and eyedrops.  I will refer you to see allergist.  I will refill your gabapentin.  Please call to schedule an appoint with a lung doctor at [phone].  No other medication changes today.  I will see back in 3 months.  Come back to see [name] sooner if needed.  Take care, Dr Jerline Pain  PLEASE NOTE:  If you had any lab tests please let us know if you have not heard back within a few days. You may see your results on mychart before we have a chance to review them but we will give you a call once they are reviewed by [name]. If we ordered any referrals today, please let us know if you have not heard from their office within the next week.   Please try these tips to maintain a healthy lifestyle:  Eat at least 3 REAL meals and 1-2 snacks per day.  Aim for no more than 5 hours between eating.  If you eat breakfast, please do so within one hour of getting up.   Each meal should contain half fruits/vegetables, one quarter protein, and one quarter carbs (no bigger than a computer mouse)  Cut down on sweet beverages. This includes juice, soda, and sweet tea.   Drink at least 1 glass of water with each meal and aim for at least 8 glasses per day  Exercise at least 150 minutes every week.

## 2022-10-12 NOTE — Assessment & Plan Note (Signed)
Not controlled.  She can try using over-the-counter allergy meds such as Allegra.  Also start Pataday drops.  Place referral to allergy.  [name] having some allergic rhinitis as well which is likely contributing to her chronic cough as above.

## 2022-10-21 ENCOUNTER — Other Ambulatory Visit: Payer: Self-pay

## 2022-10-21 ENCOUNTER — Emergency Department (HOSPITAL_COMMUNITY): Payer: Medicare Other

## 2022-10-21 ENCOUNTER — Inpatient Hospital Stay (HOSPITAL_COMMUNITY)
Admission: EM | Admit: 2022-10-21 | Discharge: 2022-10-24 | DRG: 064 | Disposition: A | Discharge status: Home / Self Care | Payer: Medicare Other | Attending: Internal Medicine | Admitting: Internal Medicine

## 2022-10-21 ENCOUNTER — Inpatient Hospital Stay (HOSPITAL_COMMUNITY): Payer: Medicare Other

## 2022-10-21 ENCOUNTER — Encounter (HOSPITAL_COMMUNITY): Payer: Self-pay | Admitting: Emergency Medicine

## 2022-10-21 DIAGNOSIS — I63413 Cerebral infarction due to embolism of bilateral middle cerebral arteries: Secondary | ICD-10-CM | POA: Diagnosis not present

## 2022-10-21 DIAGNOSIS — Z79899 Other long term (current) drug therapy: Secondary | ICD-10-CM

## 2022-10-21 DIAGNOSIS — Z794 Long term (current) use of insulin: Secondary | ICD-10-CM

## 2022-10-21 DIAGNOSIS — E11610 Type 2 diabetes mellitus with diabetic neuropathic arthropathy: Secondary | ICD-10-CM | POA: Diagnosis present

## 2022-10-21 DIAGNOSIS — K3184 Gastroparesis: Secondary | ICD-10-CM | POA: Diagnosis present

## 2022-10-21 DIAGNOSIS — E1151 Type 2 diabetes mellitus with diabetic peripheral angiopathy without gangrene: Secondary | ICD-10-CM | POA: Diagnosis present

## 2022-10-21 DIAGNOSIS — E785 Hyperlipidemia, unspecified: Secondary | ICD-10-CM | POA: Diagnosis present

## 2022-10-21 DIAGNOSIS — Z992 Dependence on renal dialysis: Secondary | ICD-10-CM

## 2022-10-21 DIAGNOSIS — E113599 Type 2 diabetes mellitus with proliferative diabetic retinopathy without macular edema, unspecified eye: Secondary | ICD-10-CM | POA: Diagnosis present

## 2022-10-21 DIAGNOSIS — I639 Cerebral infarction, unspecified: Secondary | ICD-10-CM | POA: Diagnosis not present

## 2022-10-21 DIAGNOSIS — Z89511 Acquired absence of right leg below knee: Secondary | ICD-10-CM | POA: Diagnosis not present

## 2022-10-21 DIAGNOSIS — Z833 Family history of diabetes mellitus: Secondary | ICD-10-CM

## 2022-10-21 DIAGNOSIS — I330 Acute and subacute infective endocarditis: Secondary | ICD-10-CM | POA: Diagnosis present

## 2022-10-21 DIAGNOSIS — Z993 Dependence on wheelchair: Secondary | ICD-10-CM | POA: Diagnosis not present

## 2022-10-21 DIAGNOSIS — G8191 Hemiplegia, unspecified affecting right dominant side: Secondary | ICD-10-CM | POA: Diagnosis present

## 2022-10-21 DIAGNOSIS — Z86711 Personal history of pulmonary embolism: Secondary | ICD-10-CM | POA: Diagnosis not present

## 2022-10-21 DIAGNOSIS — E1169 Type 2 diabetes mellitus with other specified complication: Secondary | ICD-10-CM | POA: Diagnosis present

## 2022-10-21 DIAGNOSIS — D631 Anemia in chronic kidney disease: Secondary | ICD-10-CM | POA: Diagnosis not present

## 2022-10-21 DIAGNOSIS — Z91041 Radiographic dye allergy status: Secondary | ICD-10-CM

## 2022-10-21 DIAGNOSIS — K219 Gastro-esophageal reflux disease without esophagitis: Secondary | ICD-10-CM | POA: Diagnosis present

## 2022-10-21 DIAGNOSIS — I38 Endocarditis, valve unspecified: Secondary | ICD-10-CM | POA: Diagnosis present

## 2022-10-21 DIAGNOSIS — I6349 Cerebral infarction due to embolism of other cerebral artery: Principal | ICD-10-CM | POA: Diagnosis present

## 2022-10-21 DIAGNOSIS — Z8249 Family history of ischemic heart disease and other diseases of the circulatory system: Secondary | ICD-10-CM | POA: Diagnosis not present

## 2022-10-21 DIAGNOSIS — M797 Fibromyalgia: Secondary | ICD-10-CM | POA: Diagnosis present

## 2022-10-21 DIAGNOSIS — R531 Weakness: Secondary | ICD-10-CM | POA: Diagnosis not present

## 2022-10-21 DIAGNOSIS — E1122 Type 2 diabetes mellitus with diabetic chronic kidney disease: Secondary | ICD-10-CM | POA: Diagnosis present

## 2022-10-21 DIAGNOSIS — N186 End stage renal disease: Secondary | ICD-10-CM | POA: Diagnosis not present

## 2022-10-21 DIAGNOSIS — Z20822 Contact with and (suspected) exposure to covid-19: Secondary | ICD-10-CM | POA: Diagnosis present

## 2022-10-21 DIAGNOSIS — E119 Type 2 diabetes mellitus without complications: Secondary | ICD-10-CM

## 2022-10-21 DIAGNOSIS — Z7901 Long term (current) use of anticoagulants: Secondary | ICD-10-CM

## 2022-10-21 DIAGNOSIS — I152 Hypertension secondary to endocrine disorders: Secondary | ICD-10-CM

## 2022-10-21 DIAGNOSIS — I120 Hypertensive chronic kidney disease with stage 5 chronic kidney disease or end stage renal disease: Secondary | ICD-10-CM | POA: Diagnosis present

## 2022-10-21 DIAGNOSIS — E1143 Type 2 diabetes mellitus with diabetic autonomic (poly)neuropathy: Secondary | ICD-10-CM | POA: Diagnosis present

## 2022-10-21 DIAGNOSIS — R69 Illness, unspecified: Secondary | ICD-10-CM | POA: Diagnosis not present

## 2022-10-21 DIAGNOSIS — Z86718 Personal history of other venous thrombosis and embolism: Secondary | ICD-10-CM

## 2022-10-21 DIAGNOSIS — R29703 NIHSS score 3: Secondary | ICD-10-CM | POA: Diagnosis present

## 2022-10-21 DIAGNOSIS — E1159 Type 2 diabetes mellitus with other circulatory complications: Secondary | ICD-10-CM | POA: Diagnosis not present

## 2022-10-21 DIAGNOSIS — Z823 Family history of stroke: Secondary | ICD-10-CM

## 2022-10-21 LAB — CBC
HCT: 39.5 % (ref 36.0–46.0)
Hemoglobin: 11.6 g/dL — ABNORMAL LOW (ref 12.0–15.0)
MCH: 30.7 pg (ref 26.0–34.0)
MCHC: 29.4 g/dL — ABNORMAL LOW (ref 30.0–36.0)
MCV: 104.5 fL — ABNORMAL HIGH (ref 80.0–100.0)
Platelets: 153 10*3/uL (ref 150–400)
RBC: 3.78 MIL/uL — ABNORMAL LOW (ref 3.87–5.11)
RDW: 17 % — ABNORMAL HIGH (ref 11.5–15.5)
WBC: 6.5 10*3/uL (ref 4.0–10.5)
nRBC: 1.9 % — ABNORMAL HIGH (ref 0.0–0.2)

## 2022-10-21 LAB — COMPREHENSIVE METABOLIC PANEL
ALT: 6 U/L (ref 0–44)
AST: 18 U/L (ref 15–41)
Albumin: 3.2 g/dL — ABNORMAL LOW (ref 3.5–5.0)
Alkaline Phosphatase: 90 U/L (ref 38–126)
Anion gap: 10 (ref 5–15)
BUN: 30 mg/dL — ABNORMAL HIGH (ref 6–20)
CO2: 29 mmol/L (ref 22–32)
Calcium: 9.7 mg/dL (ref 8.9–10.3)
Chloride: 102 mmol/L (ref 98–111)
Creatinine, Ser: 9.37 mg/dL — ABNORMAL HIGH (ref 0.44–1.00)
GFR, Estimated: 5 mL/min — ABNORMAL LOW (ref >60)
Glucose, Bld: 221 mg/dL — ABNORMAL HIGH (ref 70–99)
Potassium: 3.6 mmol/L (ref 3.5–5.1)
Sodium: 141 mmol/L (ref 135–145)
Total Bilirubin: 0.5 mg/dL (ref 0.3–1.2)
Total Protein: 7.8 g/dL (ref 6.5–8.1)

## 2022-10-21 LAB — MAGNESIUM: Magnesium: 2.3 mg/dL (ref 1.7–2.4)

## 2022-10-21 LAB — I-STAT BETA HCG BLOOD, ED (MC, WL, AP ONLY): I-stat hCG, quantitative: 6.7 m[IU]/mL — ABNORMAL HIGH (ref <5)

## 2022-10-21 MED ORDER — ACETAMINOPHEN 500 MG PO TABS
1000.0000 mg | ORAL_TABLET | Freq: Once | ORAL | Status: DC
  Filled 2022-10-21: qty 2

## 2022-10-21 MED ORDER — INSULIN ASPART 100 UNIT/ML IJ SOLN
0.0000 [IU] | INTRAMUSCULAR | Status: DC
  Administered 2022-10-22: 2 [IU] via SUBCUTANEOUS
  Filled 2022-10-21: qty 0.06

## 2022-10-21 MED ORDER — AMLODIPINE BESYLATE 5 MG PO TABS
5.0000 mg | ORAL_TABLET | Freq: Once | ORAL | Status: AC
  Administered 2022-10-21: 5 mg via ORAL
  Filled 2022-10-21: qty 1

## 2022-10-21 MED ORDER — LORAZEPAM 2 MG/ML IJ SOLN: 1.0000 mg | Freq: Once | INTRAMUSCULAR | Status: DC | PRN

## 2022-10-21 NOTE — ED Provider Notes (Signed)
[place] DEPT Provider Note   CSN: [id number] Arrival date & time: 10/21/22  1807     History  Chief Complaint  Patient presents with   Medication Reaction    Heather [name] is a 51 y.o. female.  HPI Patient reports that dialysis yesterday she got a flu shot in the right deltoid.  She reports after the shot at about 9 AM yesterday morning, she started to get some tingling and weakness into her right arm and leg.  She reports that seem to progress and then by this morning she had incoordination when she was trying to move and raise her right leg.  At baseline she does not use her leg a lot for walking.  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  She reports however due to poor fit of her prosthesis she really has not been ambulating for the about the past month.  [name] mostly in the wheelchair and then transfers using her left lower extremity.  No headache associated.  No visual changes.  No confusion.  No speech difficulty.  Patient's husband thought that maybe yesterday patient had a little bit of a right facial droop as well.  Patient had previously a stroke on the left and symptoms have all resolved.    Home Medications Prior to Admission medications   Medication Sig Start Date End Date Taking? Authorizing Provider  amLODipine-olmesartan (AZOR) 5-20 MG tablet TAKE 1 TABLET BY MOUTH EVERY DAY 09/09/22   [name], MD  azelastine (ASTELIN) 0.1 % nasal spray Place 2 sprays into both nostrils 2 (two) times daily. 10/12/22   [name], MD  calcium acetate (PHOSLO) 667 MG capsule Take 2,001 mg by mouth 3 (three) times daily with meals. 08/25/21   [provider]  ceFEPIme 2 g in sodium chloride 0.9 % 100 mL Inject 2 g into the vein every Monday, Wednesday, and Friday at 8 PM. Patient not taking: Reported on 10/12/2022 10/06/22   Jonetta Osgood, MD   clindamycin (CLEOCIN T) 1 % SWAB Apply 1 Application topically. Patient not taking: Reported on 10/12/2022 09/22/22   [provider]  Continuous Blood Gluc Sensor (FREESTYLE LIBRE 14 DAY SENSOR) MISC 1 patch by Does not apply route every 14 (fourteen) days. 02/25/22   [name], MD  DIFLUCAN 100 MG tablet Take by mouth. 10/20/22   [provider]  Doxercalciferol (HECTOROL IV) Doxercalciferol (Hectorol) 05/07/22 05/06/23  [provider]  ELIQUIS 5 MG TABS tablet Take 5 mg by mouth daily. 01/31/22   [provider]  fluticasone (FLONASE) 50 MCG/ACT nasal spray Place 2 sprays into both nostrils daily. 07/02/22   Cobb, Karie Schwalbe, NP  gabapentin (NEURONTIN) 300 MG capsule Take 1 capsule (300 mg total) by mouth every Monday, Wednesday, and Friday. At 10:30 am 10/13/22   [name], MD  insulin aspart (NOVOLOG FLEXPEN) 100 UNIT/ML FlexPen Inject 6 Units into the skin 3 (three) times daily with meals. Sliding scale 02/25/22   [name], MD  insulin degludec (TRESIBA FLEXTOUCH) 100 UNIT/ML FlexTouch Pen Inject 20 Units into the skin at bedtime. 10/05/22   Ghimire, Henreitta Leber, MD  Insulin Pen Needle 29G X 5MM MISC 1 Device by Does not apply route 4 (four) times daily. 12/26/19   Shamleffer, Melanie Crazier, MD  ketoconazole (NIZORAL) 2 % cream Apply 1 Application topically daily. 09/22/22  [provider]  loratadine (CLARITIN) 10 MG tablet Take 10 mg by mouth daily. 09/30/22   [provider]  Methoxy PEG-Epoetin Beta (MIRCERA IJ) Mircera 03/03/22 03/02/23  [provider]  Olopatadine HCl (PATADAY) 0.2 % SOLN Apply 1 drop to eye daily. 10/12/22   [name], MD  vancomycin [facility]) 1-5 GM/200ML-% SOLN Inject 200 mLs (1,000 mg total) into the vein every Monday, Wednesday, and Friday with hemodialysis. 10/06/22   Ghimire, Henreitta Leber, MD      Allergies    Ibuprofen, Dilaudid [hydromorphone hcl], Iodinated contrast media, and Tramadol     Review of Systems   Review of Systems  Physical Exam Updated Vital Signs BP (!) 218/97   Pulse 83   Temp 98.9 F (37.2 C) (Oral)   Resp (!) 22   Ht '5\' 9"'$  (1.753 m)   Wt 103 kg   LMP 09/10/2021   SpO2 100%   BMI 33.52 kg/m  Physical Exam Constitutional:      Comments: Alert clear mental status nontoxic no respiratory distress  HENT:     Head: Normocephalic and atraumatic.     Mouth/Throat:     Mouth: Mucous membranes are moist.     Pharynx: Oropharynx is clear.  Eyes:     Extraocular Movements: Extraocular movements intact.     Pupils: Pupils are equal, round, and reactive to light.  Cardiovascular:     Rate and Rhythm: Normal rate and regular rhythm.  Pulmonary:     Effort: Pulmonary effort is normal.     Breath sounds: Normal breath sounds.  Abdominal:     General: There is no distension.     Palpations: Abdomen is soft.     Tenderness: There is no abdominal tenderness. There is no guarding.  Musculoskeletal:     Cervical back: Neck supple.     Comments: Right below the knee amputation.  Well-healed.  No significant peripheral edema either lower extremity  Skin:    General: Skin is warm and dry.  Neurological:     Comments: Cranial nerves II through XII intact.  No expressive or receptive aphasia.  Right upper extremity slightly weak compared to the left for push pull.  Right lower extremity symmetric strength good push pull.  Patient has ataxia with elevating and trying to hold the right lower extremity.  Psychiatric:        Mood and Affect: Mood normal.     ED Results / Procedures / Treatments   Labs (all labs ordered are listed, but only abnormal results are displayed) Labs Reviewed  CBC - Abnormal; Notable for the following components:      Result Value   RBC 3.78 (*)    Hemoglobin 11.6 (*)    MCV 104.5 (*)    MCHC 29.4 (*)    RDW 17.0 (*)    nRBC 1.9 (*)    All other components within normal limits  COMPREHENSIVE METABOLIC PANEL - Abnormal;  Notable for the following components:   Glucose, Bld 221 (*)    BUN 30 (*)    Creatinine, Ser 9.37 (*)    Albumin 3.2 (*)    GFR, Estimated 5 (*)    All other components within normal limits  I-STAT BETA HCG BLOOD, ED ([facility], [facility], [facility] ONLY) - Abnormal; Notable for the following components:   I-stat hCG, quantitative 6.7 (*)    All other components within normal limits  MAGNESIUM  URINALYSIS, ROUTINE W REFLEX MICROSCOPIC    EKG EKG  Interpretation  Date/Time:  Thursday October 21 2022 19:20:54 EDT Ventricular Rate:  79 PR Interval:  148 QRS Duration: 80 QT Interval:  378 QTC Calculation: 434 R Axis:   52 Text Interpretation: Sinus rhythm Low voltage, precordial leads no change from previous Confirmed by Charlesetta Shanks [phone]) on 10/21/2022 7:33:30 PM  Radiology MR Cervical Spine Wo Contrast  Result Date: 10/21/2022 CLINICAL DATA:  Right-sided weakness EXAM: MRI CERVICAL SPINE WITHOUT CONTRAST TECHNIQUE: Multiplanar, multisequence MR imaging of the cervical spine was performed. No intravenous contrast was administered. COMPARISON:  None Available. FINDINGS: Alignment: No listhesis. Vertebrae: No fracture, evidence of discitis, or bone lesion. Cord: Normal signal and morphology. Posterior Fossa, vertebral arteries, paraspinal tissues: Negative. Disc levels: C2-C3: No significant disc bulge. No spinal canal stenosis or neuroforaminal narrowing. C3-C4: No significant disc bulge. Left-greater-than-right uncovertebral hypertrophy. No spinal canal stenosis. Mild left neural foraminal narrowing. C4-C5: Minimal disc bulge. Mild spinal canal stenosis. No neural foraminal narrowing. C5-C6: Minimal disc bulge with right subarticular protrusion. Mild spinal canal stenosis. No neural foraminal narrowing. C6-C7: Minimal disc bulge. No spinal canal stenosis or neural foraminal narrowing. C7-T1: No significant disc bulge. No spinal canal stenosis or neuroforaminal narrowing. IMPRESSION: 1. C4-C5 and C5-C6  mild spinal canal stenosis. 2. C3-C4 mild left neural foraminal narrowing. Electronically Signed   By: Merilyn Baba M.D.   On: 10/21/2022 22:49   MR BRAIN WO CONTRAST  Result Date: 10/21/2022 CLINICAL DATA:  Right-sided weakness EXAM: MRI HEAD WITHOUT CONTRAST TECHNIQUE: Multiplanar, multiecho pulse sequences of the brain and surrounding structures were obtained without intravenous contrast. COMPARISON:  MRI head 07/11/2018, correlation is also made with CT head 10/21/2022 FINDINGS: Brain: Restricted diffusion with ADC correlate in the left paramedian frontoparietal region (series 5, images 40-45), measuring up to 1.9 1.9 x 1.0 x 1.6 cm. Additional punctate foci of restricted diffusion with ADC correlate in the left frontal cortex (series 5, image 40)and right occipital cortex (series 5, image 20). Redemonstrated remote infarct in the right cerebellum. No acute hemorrhage, mass, mass effect, or midline shift. No hydrocephalus or extra-axial collection. T2 hyperintense signal in the periventricular white matter, likely the sequela of moderate chronic small vessel ischemic disease. Vascular: Normal arterial flow voids. Skull and upper cervical spine: Normal marrow signal. Sinuses/Orbits: Mucosal thickening in the right-greater-than-left maxillary sinus and left sphenoid sinus. Status post bilateral lens replacements. Other: Fluid in the left-greater-than-right mastoid air cells. IMPRESSION: Acute infarct in the left paramedian frontoparietal region, with punctate additional acute infarcts in the left frontal cortex and right occipital cortex. Given multiple vascular territories, an embolic etiology is suspected. These results were called by telephone at the time of interpretation on 10/21/2022 at 10:27 pm to provider [facility] , who verbally acknowledged these results. Electronically Signed   By: Merilyn Baba M.D.   On: 10/21/2022 22:27   CT Head Wo Contrast  Result Date: 10/21/2022 CLINICAL DATA:   Tingling in right arm after getting flu shot yesterday. Weird feeling in head and legs. EXAM: CT HEAD WITHOUT CONTRAST TECHNIQUE: Contiguous axial images were obtained from the base of the skull through the vertex without intravenous contrast. RADIATION DOSE REDUCTION: This exam was performed according to the departmental dose-optimization program which includes automated exposure control, adjustment of the mA and/or kV according to patient size and/or use of iterative reconstruction technique. COMPARISON:  07/11/2018. FINDINGS: Brain: No acute intracranial hemorrhage, midline shift or mass effect. No extra-axial fluid collection. Periventricular white matter hypodensities are present bilaterally. No hydrocephalus. There is  an old infarct in the right cerebral hemisphere. Vascular: No hyperdense vessel or unexpected calcification. Skull: Normal. Negative for fracture or focal lesion. Sinuses/Orbits: Mild mucosal thickening in the maxillary sinuses bilaterally. There is partial opacification of the left sphenoid sinus. Other: None. IMPRESSION: 1. No acute intracranial process. 2. Chronic microvascular ischemic changes and old infarct in the right cerebellar hemisphere. Electronically Signed   By: Brett Fairy M.D.   On: 10/21/2022 20:13    Procedures Procedures   CRITICAL CARE Performed by: Charlesetta Shanks   Total critical care time: 30 minutes  Critical care time was exclusive of separately billable procedures and treating other patients.  Critical care was necessary to treat or prevent imminent or life-threatening deterioration.  Critical care was time spent personally by me on the following activities: development of treatment plan with patient and/or surrogate as well as nursing, discussions with consultants, evaluation of patient's response to treatment, examination of patient, obtaining history from patient or surrogate, ordering and performing treatments and interventions, ordering and review of  laboratory studies, ordering and review of radiographic studies, pulse oximetry and re-evaluation of patient's condition.  Medications Ordered in ED Medications  LORazepam (ATIVAN) injection 1 mg (has no administration in time range)  acetaminophen (TYLENOL) tablet 1,000 mg (1,000 mg Oral Patient Refused/Not Given 10/21/22 2046)  insulin aspart (novoLOG) injection 0-6 Units (has no administration in time range)  amLODipine (NORVASC) tablet 5 mg (5 mg Oral Given 10/21/22 2046)    ED Course/ Medical Decision Making/ A&P                           Medical Decision Making Amount and/or Complexity of Data Reviewed Labs: ordered. Radiology: ordered.  Risk OTC drugs. Prescription drug management. Decision regarding hospitalization.   Patient presents with right-sided weakness and some ataxia of the lower extremity.  She reports it happened about the same time she got a flu shot.  9 AM yesterday.  At this time however, she continues to have right lower extremity weakness and incoordination as well as some right upper extremity.  We will proceed with CT head for acute complication.  CT without any acute findings.  Will proceed with MRI as well.  Consultion: Reviewed with Dr.Khalindique.  Agrees with MRI brain and recommends addition of cervical spine.  If patient is positive for stroke, appropriate for transfer to [facility].   Consult: Reviewed with Dr. Roel Cluck for admission.  Patient has chronic renal failure with BUN of 30 and creatinine of 9.3.  Stable anemia.  Consultation placed for nephrology.  Patient is admitted in stable condition.  [name] alert with her mental status and protecting her airway.  She has symptoms that she attributed to possible immunization however at this time findings are consistent with acute CVA.  Patient has multiple other comorbid conditions.         Final Clinical Impression(s) / ED Diagnoses Final diagnoses:  Acute CVA (cerebrovascular accident) ([place])   Severe comorbid illness    Rx / DC Orders ED Discharge Orders     None         Charlesetta Shanks, MD 10/29/22 1550

## 2022-10-21 NOTE — Assessment & Plan Note (Signed)
Order sliding scale continue Tresiba 20 units subcu nightly

## 2022-10-21 NOTE — Assessment & Plan Note (Signed)
Allow permissive hypertension 

## 2022-10-21 NOTE — Assessment & Plan Note (Addendum)
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        Order PT/OT evaluation.        keep nothing by mouth until passes swallow eval        Will make sure patient is on antiplatelet ASA 81  Discussed with neurology we will hold off on Eliquis for tonight for at least 2 days Check lipid panel statins if indicated LDL above 70       Allow permissive Hypertension keep BP <220/120        Neurology consulted  will see in AM

## 2022-10-21 NOTE — Assessment & Plan Note (Signed)
Chronic stable follow hemoglobin

## 2022-10-21 NOTE — ED Provider Triage Note (Signed)
Emergency Medicine Provider Triage Evaluation Note  Heather [name] , a 51 y.o. female  was evaluated in triage.  Pt complains of right sided weakness.  States that yesterday while she was at dialysis she received a flu shot in her right deltoid.  States that around 45 minutes after she got this vaccine at about 9 AM yesterday morning, she developed what she describes as 'heaviness' in her right arm and right leg.  States that same is persisted since then.  Patient is wheelchair-bound at baseline.  Endorses a mild headache on her right side, denies fevers, chills, dizziness, blurred vision, or neck pain.  Also denies chest pain or shortness of breath.  Of note, patient is no longer anticoagulated.  Review of Systems  Positive:  Negative:   Physical Exam  BP (!) 198/97   Pulse 83   Temp 99.1 F (37.3 C) (Oral)   Resp 18   Ht '5\' 9"'$  (1.753 m)   Wt 103 kg   LMP 09/10/2021   SpO2 97%   BMI 33.52 kg/m  Gen:   Awake, no distress   Resp:  Normal effort  MSK:   Moves extremities without difficulty  Other:  Alert and oriented and neurologically intact without focal deficits  Medical Decision Making  Medically screening exam initiated at 6:29 PM.  Appropriate orders placed.  Retta Pitcher [place] was informed that the remainder of the evaluation will be completed by another provider, this initial triage assessment does not replace that evaluation, and the importance of remaining in the ED until their evaluation is complete.     Bud Face, PA-C 10/21/22 1832

## 2022-10-21 NOTE — Assessment & Plan Note (Signed)
Deferred to nephrology need to continue vancomycin and cefepime with hemodialysis

## 2022-10-21 NOTE — ED Triage Notes (Signed)
Pt reports tingling in right arm after getting flu shot yesterday. Pt reports feeling weird in her head and legs as well.  Stroke screen negative in triage

## 2022-10-21 NOTE — ED Notes (Signed)
Pt states unable to void at this time, does not make much urine anymore

## 2022-10-21 NOTE — Subjective & Objective (Signed)
Patient presents with tingling in her right arm after getting the flu shot yesterday feels weird in her head and legs as well Factors involved history of diabetes, end-stage renal disease on hemodialysis Monday Wednesday Friday patient has history of blood clots on Eliquis and has a known mitral valve vegetation diagnosed just this month. 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 Was seen in consult by ID Dr. Graylon Good plan was to discharge home with vancomycin and cefepime for the next 6 weeks She was also seen by CT surgery who recommended repeating echo in about 2 to 3 weeks

## 2022-10-21 NOTE — Assessment & Plan Note (Signed)
Chronic-stable.

## 2022-10-21 NOTE — Assessment & Plan Note (Signed)
ER provider to notify nephrology of patient's been admitted will need HD in a.m.

## 2022-10-21 NOTE — H&P (Signed)
Heather [name] NTI:[id number] DOB: [birth year] DOA: 10/21/2022     PCP: [name], MD   Outpatient Specialists:  CARDS:  Dr. Marlou Porch NEphrology:      Patient arrived to ER on 10/21/22 at 1807 Referred by Attending Charlesetta Shanks, MD   Patient coming from:    home Lives  With family    Chief Complaint:   Chief Complaint  Patient presents with   Medication Reaction    HPI: Heather [name] is a 51 y.o. 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 Was seen in consult by ID Dr. Graylon Good plan was to discharge home with vancomycin and cefepime for the next 6 weeks She was also seen by CT surgery who recommended repeating echo in about 2 to 3 weeks Does not smoke or drink  Makes very little urine has been on HD for the past 5 years  Now has HD catheter   She had to come off the eliquis for a procedure few week ago and it was restarted but she was unsure so she has not restarted    Regarding pertinent Chronic problems:       HTN on Azor      DM 2 -  Lab Results  Component Value Date   HGBA1C 7.6 (H) 09/30/2022    on insulin,     Hx of CVA -  with residual deficits on  eliquis     Hx of DVT/PE  on - anticoagulation with  Eliquis,   End stage renal disease on hemodialysis Monday Wednesday Friday Lab Results  Component Value Date   CREATININE 9.37 (H) 10/21/2022   CREATININE 6.02 (H) 10/05/2022   CREATININE 6.61 (H) 10/02/2022    Chronic anemia - baseline hg Hemoglobin & Hematocrit  Recent Labs    10/02/22 0327 10/05/22 0423 10/21/22 1828  HGB 11.1* 11.1* 11.6*     While in ER:   CT head initially unremarkable But MRI showed an acute infarct in the left paramedial frontal parietal region embolic etiology suspected   Ordered  CT HEAD   NON acute  MRI showing multiple embolic infarcts  Following Medications were ordered in ER: Medications  LORazepam (ATIVAN) injection 1 mg (has no administration in time range)  acetaminophen (TYLENOL) tablet 1,000 mg (1,000 mg Oral Patient Refused/Not Given 10/21/22 2046)  amLODipine (NORVASC) tablet 5 mg (5 mg Oral Given 10/21/22 2046)    _______________________________________________________ ER Provider Called:  Neurology     Dr.Khaliqdina They Recommend admit to medicine   Will see in AM      ED Triage Vitals  Enc Vitals Group     BP 10/21/22 1816 (!) 198/97     Pulse Rate 10/21/22 1816 83     Resp 10/21/22 1816 18  Temp 10/21/22 1816 99.1 F (37.3 C)     Temp Source 10/21/22 1816 Oral     SpO2 10/21/22 1816 97 %     Weight 10/21/22 1816 227 lb (103 kg)     Height 10/21/22 1816 '5\' 9"'$  (1.753 m)     Head Circumference --      Peak Flow --      Pain Score 10/21/22 1817 4     Pain Loc --      Pain Edu? --      Excl. in [place]? --   TMAX(24)@     _________________________________________ Significant initial  Findings: Abnormal Labs Reviewed  CBC - Abnormal; Notable for the following components:      Result Value   RBC 3.78 (*)    Hemoglobin 11.6 (*)    MCV 104.5 (*)    MCHC 29.4 (*)    RDW 17.0 (*)    nRBC 1.9 (*)    All other components within normal limits  COMPREHENSIVE METABOLIC PANEL -  Abnormal; Notable for the following components:   Glucose, Bld 221 (*)    BUN 30 (*)    Creatinine, Ser 9.37 (*)    Albumin 3.2 (*)    GFR, Estimated 5 (*)    All other components within normal limits  I-STAT BETA HCG BLOOD, ED ([facility], [facility], [facility] ONLY) - Abnormal; Notable for the following components:   I-stat hCG, quantitative 6.7 (*)    All other components within normal limits      ECG: Ordered Personally reviewed and interpreted by me showing: HR : 79 Rhythm: Sinus rhythm Low voltage, precordial leads QTC 434    The recent clinical data is shown below. Vitals:   10/21/22 1816 10/21/22 1950 10/21/22 2047 10/21/22 2215  BP: (!) 198/97 (!) 196/95 (!) 184/82 (!) 218/97  Pulse: 83 81 80 83  Resp: 18 (!) 22 18 (!) 22  Temp: 99.1 F (37.3 C)   98.9 F (37.2 C)  TempSrc: Oral   Oral  SpO2: 97% 98% 97% 100%  Weight: 103 kg     Height: '5\' 9"'$  (1.753 m)       WBC     Component Value Date/Time   WBC 6.5 10/21/2022 1828   LYMPHSABS 1.7 09/21/2022 2240   MONOABS 0.7 09/21/2022 2240   EOSABS 0.7 (H) 09/21/2022 2240   BASOSABS 0.0 09/21/2022 2240      Results for orders placed or performed during the hospital encounter of 09/30/22  Culture, blood (Routine X 2) w Reflex to ID Panel     Status: None   Collection Time: 09/30/22  8:21 PM   Specimen: BLOOD  Result Value Ref Range Status   Specimen Description BLOOD BLOOD RIGHT HAND  Final   Special Requests   Final    BOTTLES DRAWN AEROBIC AND ANAEROBIC Blood Culture adequate volume   Culture   Final    NO GROWTH 5 DAYS Performed at [facility], [place] [address] [place], [place] [address]    Report Status 10/05/2022 FINAL  Final  Culture, blood (Routine X 2) w Reflex to ID Panel     Status: None   Collection Time: 09/30/22  8:22 PM   Specimen: BLOOD  Result Value Ref Range Status   Specimen Description BLOOD LEFT ANTECUBITAL  Final   Special Requests   Final    BOTTLES DRAWN AEROBIC AND ANAEROBIC Blood Culture adequate  volume   Culture   Final    NO  GROWTH 5 DAYS Performed at [facility], [place] [address] [place], [place] [address]    Report Status 10/05/2022 FINAL  Final  Culture, blood (Routine X 2) w Reflex to ID Panel     Status: None   Collection Time: 10/03/22  6:58 PM   Specimen: BLOOD RIGHT HAND  Result Value Ref Range Status   Specimen Description BLOOD RIGHT HAND  Final   Special Requests   Final    BOTTLES DRAWN AEROBIC AND ANAEROBIC Blood Culture adequate volume   Culture   Final    NO GROWTH 5 DAYS Performed at [facility], [place] [address] [place], [place] [address]    Report Status 10/08/2022 FINAL  Final  Culture, blood (Routine X 2) w Reflex to ID Panel     Status: None   Collection Time: 10/03/22  7:52 PM   Specimen: BLOOD RIGHT HAND  Result Value Ref Range Status   Specimen Description BLOOD RIGHT HAND  Final   Special Requests   Final    BOTTLES DRAWN AEROBIC AND ANAEROBIC Blood Culture adequate volume   Culture   Final    NO GROWTH 5 DAYS Performed at [facility], [place] [address], [place], [place] [address]    Report Status 10/08/2022 FINAL  Final   *Note: Due to a large number of results and/or encounters for the requested time period, some results have not been displayed. A complete set of results can be found in Results Review.    _______________________________________________ Hospitalist was called for admission for   Acute CVA       The following Work up has been ordered so far:  Orders Placed This Encounter  Procedures   CT Head Wo Contrast   MR BRAIN WO CONTRAST   MR Cervical Spine Wo Contrast   CBC   Urinalysis, Routine w reflex microscopic Urine, Clean Catch   Magnesium   Comprehensive metabolic panel   Consult to hospitalist   Consult to hospitalist   I-Stat Beta hCG blood, ED ([facility], [facility], [facility] only)   ED EKG   EKG 12-Lead     OTHER Significant initial  Findings:  labs showing:  Recent Labs  Lab 10/21/22 1928  NA 141  K 3.6   CO2 29  GLUCOSE 221*  BUN 30*  CREATININE 9.37*  CALCIUM 9.7  MG 2.3    Cr   stable  Lab Results  Component Value Date   CREATININE 9.37 (H) 10/21/2022   CREATININE 6.02 (H) 10/05/2022   CREATININE 6.61 (H) 10/02/2022    Recent Labs  Lab 10/21/22 1928  AST 18  ALT 6  ALKPHOS 90  BILITOT 0.5  PROT 7.8  ALBUMIN 3.2*   Lab Results  Component Value Date   CALCIUM 9.7 10/21/2022   PHOS 3.4 10/05/2022    Plt: Lab Results  Component Value Date   PLT 153 10/21/2022      COVID-19 Labs  No results for input(s): "DDIMER", "FERRITIN", "LDH", "CRP" in the last 72 hours.  Lab Results  Component Value Date   SARSCOV2NAA NEGATIVE 09/21/2022   SARSCOV2NAA NEGATIVE 08/18/2022   SARSCOV2NAA NEGATIVE 12/28/2021   [place] NEGATIVE 12/18/2021        Recent Labs  Lab 10/21/22 1828  WBC 6.5  HGB 11.6*  HCT 39.5  MCV 104.5*  PLT 153    HG/HCT  stable,      Component Value Date/Time   HGB 11.6 (L) 10/21/2022 1828   HCT  39.5 10/21/2022 1828   MCV 104.5 (H) 10/21/2022 1828      DM  labs:  HbA1C: Recent Labs    12/19/21 0654 09/30/22 2023  HGBA1C 5.6 7.6*      CBG (last 3)  No results for input(s): "GLUCAP" in the last 72 hours.    Cultures:    Component Value Date/Time   SDES BLOOD RIGHT HAND 10/03/2022 1952   SPECREQUEST  10/03/2022 1952    BOTTLES DRAWN AEROBIC AND ANAEROBIC Blood Culture adequate volume   CULT  10/03/2022 1952    NO GROWTH 5 DAYS Performed at [facility], [name] [address] [place], [place] [address]    REPTSTATUS 10/08/2022 FINAL 10/03/2022 1952     Radiological Exams on Admission: MR BRAIN WO CONTRAST  Result Date: 10/21/2022 CLINICAL DATA:  Right-sided weakness EXAM: MRI HEAD WITHOUT CONTRAST TECHNIQUE: Multiplanar, multiecho pulse sequences of the brain and surrounding structures were obtained without intravenous contrast. COMPARISON:  MRI head 07/11/2018, correlation is also made with CT head 10/21/2022 FINDINGS:  Brain: Restricted diffusion with ADC correlate in the left paramedian frontoparietal region (series 5, images 40-45), measuring up to 1.9 1.9 x 1.0 x 1.6 cm. Additional punctate foci of restricted diffusion with ADC correlate in the left frontal cortex (series 5, image 40)and right occipital cortex (series 5, image 20). Redemonstrated remote infarct in the right cerebellum. No acute hemorrhage, mass, mass effect, or midline shift. No hydrocephalus or extra-axial collection. T2 hyperintense signal in the periventricular white matter, likely the sequela of moderate chronic small vessel ischemic disease. Vascular: Normal arterial flow voids. Skull and upper cervical spine: Normal marrow signal. Sinuses/Orbits: Mucosal thickening in the right-greater-than-left maxillary sinus and left sphenoid sinus. Status post bilateral lens replacements. Other: Fluid in the left-greater-than-right mastoid air cells. IMPRESSION: Acute infarct in the left paramedian frontoparietal region, with punctate additional acute infarcts in the left frontal cortex and right occipital cortex. Given multiple vascular territories, an embolic etiology is suspected. These results were called by telephone at the time of interpretation on 10/21/2022 at 10:27 pm to provider [facility] , who verbally acknowledged these results. Electronically Signed   By: Merilyn Baba M.D.   On: 10/21/2022 22:27   CT Head Wo Contrast  Result Date: 10/21/2022 CLINICAL DATA:  Tingling in right arm after getting flu shot yesterday. Weird feeling in head and legs. EXAM: CT HEAD WITHOUT CONTRAST TECHNIQUE: Contiguous axial images were obtained from the base of the skull through the vertex without intravenous contrast. RADIATION DOSE REDUCTION: This exam was performed according to the departmental dose-optimization program which includes automated exposure control, adjustment of the mA and/or kV according to patient size and/or use of iterative reconstruction  technique. COMPARISON:  07/11/2018. FINDINGS: Brain: No acute intracranial hemorrhage, midline shift or mass effect. No extra-axial fluid collection. Periventricular white matter hypodensities are present bilaterally. No hydrocephalus. There is an old infarct in the right cerebral hemisphere. Vascular: No hyperdense vessel or unexpected calcification. Skull: Normal. Negative for fracture or focal lesion. Sinuses/Orbits: Mild mucosal thickening in the maxillary sinuses bilaterally. There is partial opacification of the left sphenoid sinus. Other: None. IMPRESSION: 1. No acute intracranial process. 2. Chronic microvascular ischemic changes and old infarct in the right cerebellar hemisphere. Electronically Signed   By: Brett Fairy M.D.   On: 10/21/2022 20:13   _______________________________________________________________________________________________________ Latest  Blood pressure (!) 218/97, pulse 83, temperature 98.9 F (37.2 C), temperature source Oral, resp. rate (!) 22, height '5\' 9"'$  (1.753 m), weight 103  kg, last menstrual period 09/10/2021, SpO2 100 %.   Vitals  labs and radiology finding personally reviewed  Review of Systems:    Pertinent positives include: right arm tingling and ataxia  Constitutional:  No weight loss, night sweats, Fevers, chills, fatigue, weight loss  HEENT:  No headaches, Difficulty swallowing,Tooth/dental problems,Sore throat,  No sneezing, itching, ear ache, nasal congestion, post nasal drip,  Cardio-vascular:  No chest pain, Orthopnea, PND, anasarca, dizziness, palpitations.no Bilateral lower extremity swelling  GI:  No heartburn, indigestion, abdominal pain, nausea, vomiting, diarrhea, change in bowel habits, loss of appetite, melena, blood in stool, hematemesis Resp:  no shortness of breath at rest. No dyspnea on exertion, No excess mucus, no productive cough, No non-productive cough, No coughing up of blood.No change in color of mucus.No wheezing. Skin:  no  rash or lesions. No jaundice GU:  no dysuria, change in color of urine, no urgency or frequency. No straining to urinate.  No flank pain.  Musculoskeletal:  No joint pain or no joint swelling. No decreased range of motion. No back pain.  Psych:  No change in mood or affect. No depression or anxiety. No memory loss.  Neuro: no localizing neurological complaints, no tingling,  no slurred speech, no confusion  All systems reviewed and apart from [place] all are negative _______________________________________________________________________________________________ Past Medical History:   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[place]), nonhemorrhagic, inferior right cerebellum 12/03/2017   left side weakness in leg   Cyst of left ovary 01/12/2018   Diabetic neuropathy, painful ([place]), on low dose Gabapentin 07/13/2013   DM (diabetes mellitus), type 2, uncontrolled, with renal complications 46/96/2952   DM2 (diabetes mellitus, type 2) ([place])    Dysfunction of left eustachian tube, with pusatile tinnitus 11/24/2017   Dyslipidemia associated with type 2 diabetes mellitus ([place]) 06/25/2016   ESRD (end stage renal disease) on dialysis [facility])    On hemodialysis in July 2019 via [facility] then switched to [facility] in Nov 2019.    ESRD with anemia (H[facility])    Fibromyalgia    Gastroparesis due to DM    GERD (gastroesophageal reflux disease)    Headache    Hypertension associated with diabetes ([place]) 02/19/2011   IBS (irritable bowel syndrome)    Left-sided weakness 12/10/2017   .   Leiomyoma of uterus    Pancreatitis    PE (pulmonary thromboembolism) ([place]) 11/04/2019   Pneumonia    Proliferative diabetic retinopathy ([place]) 09/29/2015   Pronation deformity of both feet 06/14/2014   Reactive depression 12/10/2017   .    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   Thromboembolism ([place]) 04/05/2018   Upper airway cough syndrome, with recs to stay off ACE and take Pepcid q hs 11/15/2016      Past Surgical History:  Procedure Laterality Date   A/V FISTULAGRAM Right 03/03/2020   Procedure: Venogram;  Surgeon: Waynetta Sandy, MD;  Location: [place] [facility];  Service: Cardiovascular;  Laterality: Right;   ARTERY REPAIR Right 02/17/2018   Procedure: EXPLORATION OF RIGHT BRACHIAL ARTERY;  Surgeon: Conrad Belvedere, MD;  Location: [place];  Service: Vascular;  Laterality: Right;   ARTERY REPAIR Right 02/17/2018   Procedure: BRACHIAL ARTERY EXPLORATION AND TRHOMBECTOMY;  Surgeon: Conrad Laplace, MD;  Location: [facility] OR;  Service: Vascular;  Laterality: Right;   AV FISTULA PLACEMENT Left 05/09/2017   Procedure: INSERTION OF ARTERIOVENOUS (AV) GRAFT ARM (ARTEGRAFT);  Surgeon: Conrad Hemingford, MD;  Location: [place];  Service: Vascular;  Laterality: Left;   AV FISTULA PLACEMENT Right 02/17/2018   Procedure: INSERTION OF ARTERIOVENOUS (AV) GORE-TEX GRAFT ARM RIGHT UPPER ARM;  Surgeon: Conrad [place], MD;  Location: [place];  Service: Vascular;  Laterality: Right;   AV FISTULA PLACEMENT Left 10/10/2019   Procedure: INSERTION OF LEFT ARTERIOVENOUS (AV) ARTEGRAFT GRAFT ARM;  Surgeon: Waynetta Sandy, MD;  Location: [place];  Service: Vascular;  Laterality: Left;   AV FISTULA PLACEMENT Right 03/19/2020   Procedure: INSERTION OF ARTERIOVENOUS (AV) GORE-TEX GRAFT IN RIGHT ARM AND VIABAHN STENT IN RIGHT AXILLARY ARTERY;  Surgeon: Waynetta Sandy, MD;  Location: [place];  Service: Vascular;  Laterality: Right;   [place] Left 08/31/2016   Procedure: BASILIC VEIN TRANSPOSITION FIRST STAGE;  Surgeon: Conrad [place], MD;  Location: [place];  Service: Vascular;  Laterality: Left;   BRONCHIAL WASHINGS   09/17/2021   Procedure: BRONCHIAL WASHINGS;  Surgeon: Collene Gobble, MD;  [facility];  Service: Cardiopulmonary;;   CENTRAL VENOUS CATHETER INSERTION Left 07/24/2018   Procedure: INSERTION CENTRAL LINE ADULT;  Surgeon: Conrad Negley, MD;  Location: [place];  Service: Vascular;  Laterality: Left;   EYE SURGERY     secondary to diabetic retinopathy    FOOT SURGERY Right    t"ook bone out- maybe hammer toe"   INSERTION OF DIALYSIS CATHETER Left 07/24/2018   Procedure: INSERTION OF TUNNELED DIALYSIS CATHETER;  Surgeon: Conrad [place]ann[place], MD;  Location: [place];  Service: Vascular;  Laterality: Left;   IR FLUORO GUIDE CV LINE LEFT  12/19/2021   IR FLUORO GUIDE CV LINE LEFT  12/29/2021   IR FLUORO GUIDE CV LINE RIGHT  07/21/2018   IR FLUORO GUIDE CV LINE RIGHT  06/01/2019   IR FLUORO GUIDE CV LINE RIGHT  05/05/2020   IR FLUORO GUIDE CV LINE RIGHT  10/04/2022   IR REMOVAL TUN CV CATH W/O FL  01/23/2021   IR REMOVAL TUN CV CATH W/O FL  12/19/2021   IR REMOVAL TUN CV CATH W/O FL  10/01/2022   IR US GUIDE VASC ACCESS LEFT  12/19/2021   IR US GUIDE VASC ACCESS RIGHT  07/21/2018   IR US GUIDE VASC ACCESS RIGHT  06/01/2019   IR US GUIDE VASC ACCESS RIGHT  10/04/2022   LIGATION ARTERIOVENOUS GORTEX GRAFT Right 03/20/2020   Procedure: LIGATION ARM ARTERIOVENOUS GORTEX GRAFT With Patch Angioplasty, Thrombectomy;  Surgeon: Waynetta Sandy, MD;  Location: [place];  Service: Vascular;  Laterality: Right;   LIGATION OF ARTERIOVENOUS  FISTULA Left 05/09/2017   Procedure: LIGATION OF ARTERIOVENOUS  FISTULA;  Surgeon: Conrad [place], MD;  Location: [place];  Service: Vascular;  Laterality: Left;   LOOP RECORDER INSERTION N/A 12/05/2017   Procedure: LOOP RECORDER INSERTION;  Surgeon: Evans Lance, MD;  Location: [place] [facility];  Service: Cardiovascular;  Laterality: N/A;   MYOMECTOMY     REMOVAL OF GRAFT Right 02/17/2018   Procedure: REMOVAL OF RIGHT UPPER ARM ARTERIOVENOUS GRAFT;  Surgeon: Conrad Brooke, MD;   Location: [place];  Service: Vascular;  Laterality: Right;   REVISION OF ARTERIOVENOUS GORETEX GRAFT Left 07/24/2018   Procedure: REDO ARTERIOVENOUS GORETEX GRAFT;  Surgeon: Conrad [place], MD;  Location[facility];  Service: Vascular;  Laterality: Left;   TEE WITHOUT CARDIOVERSION N/A 12/05/2017   Procedure: TRANSESOPHAGEAL ECHOCARDIOGRAM (TEE);  Surgeon: Sanda Klein, MD;  Location: [place];  Service: Cardiovascular;  Laterality: N/A;   TEE WITHOUT CARDIOVERSION N/A 09/30/2022   Procedure: TRANSESOPHAGEAL ECHOCARDIOGRAM (TEE);  Surgeon: Jerline Pain, MD;  Location: [facility];  Service: Cardiovascular;  Laterality: N/A;   UPPER EXTREMITY VENOGRAPHY Left 07/13/2018   Procedure: UPPER EXTREMITY VENOGRAPHY - Central & Left Arm;  Surgeon: Conrad H[place]burg, MD;  Location: [place] [facility];  Service: Cardiovascular;  Laterality: Left;   UPPER EXTREMITY VENOGRAPHY Bilateral 09/10/2019   Procedure: UPPER EXTREMITY VENOGRAPHY;  Surgeon: Waynetta Sandy, MD;  Location: [place] [facility];  Service: Cardiovascular;  Laterality: Bilateral;   UTERINE FIBROID SURGERY     VIDEO BRONCHOSCOPY N/A 09/17/2021   Procedure: VIDEO BRONCHOSCOPY WITHOUT FLUORO;  Surgeon: Collene Gobble, MD;  [facility];  Service: Cardiopulmonary;  Laterality: N/A;   VITRECTOMY Bilateral     Social History:  Ambulatory   walker       reports that she has never smoked. She has never used smokeless tobacco. She reports that she does not drink alcohol and does not use drugs.  Family History:   Family History  Problem Relation Age of Onset   Colon polyps Mother    Diabetes Mother    Heart murmur Father        history essentially unknown   Hypertension Father    Diabetes Sister    Kidney failure Sister        kidney transplant   Diabetes Brother    Retinal degeneration Brother    Heart murmur Son    Stroke Paternal Grandmother    Diabetes Sister     ______________________________________________________________________________________________ Allergies: Allergies  Allergen Reactions   Ibuprofen Other (See Comments)    CKD stage 3. Should avoid.   Dilaudid [Hydromorphone Hcl] Itching and Other (See Comments)    Can take with Benadryl.   Iodinated Contrast Media Rash   Tramadol Itching     Prior to Admission medications   Medication Sig Start Date End Date Taking? Authorizing Provider  amLODipine-olmesartan (AZOR) 5-20 MG tablet TAKE 1 TABLET BY MOUTH EVERY DAY 09/09/22   [name], MD  azelastine (ASTELIN) 0.1 % nasal spray Place 2 sprays into both nostrils 2 (two) times daily. 10/12/22   [name], MD  calcium acetate (PHOSLO) 667 MG capsule Take 2,001 mg by mouth 3 (three) times daily with meals. 08/25/21   [provider]  ceFEPIme 2 g in sodium chloride 0.9 % 100 mL Inject 2 g into the vein every Monday, Wednesday, and Friday at 8 PM. Patient not taking: Reported on 10/12/2022 10/06/22   Jonetta Osgood, MD  clindamycin (CLEOCIN T) 1 % SWAB Apply 1 Application topically. Patient not taking: Reported on 10/12/2022 09/22/22   [provider]  Continuous Blood Gluc Sensor (FREESTYLE LIBRE 14 DAY SENSOR) MISC 1 patch by Does not apply route every 14 (fourteen) days. 02/25/22   [name], MD  Doxercalciferol (HECTOROL IV) Doxercalciferol (Hectorol) 05/07/22 05/06/23  [provider]  ELIQUIS 5 MG TABS tablet Take 5 mg by mouth daily. 01/31/22   [provider]  fluticasone (FLONASE) 50 MCG/ACT nasal spray Place 2 sprays into both nostrils daily. 07/02/22   Cobb, Karie Schwalbe, NP  gabapentin (NEURONTIN) 300 MG capsule Take 1 capsule (300 mg total) by mouth every Monday,  Wednesday, and Friday. At 10:30 am 10/13/22   [name], MD  insulin aspart (NOVOLOG FLEXPEN) 100 UNIT/ML FlexPen Inject 6 Units into the skin 3 (three) times daily with meals. Sliding scale 02/25/22   [name],  MD  insulin degludec (TRESIBA FLEXTOUCH) 100 UNIT/ML FlexTouch Pen Inject 20 Units into the skin at bedtime. 10/05/22   Ghimire, Henreitta Leber, MD  Insulin Pen Needle 29G X 5MM MISC 1 Device by Does not apply route 4 (four) times daily. 12/26/19   Shamleffer, Melanie Crazier, MD  ketoconazole (NIZORAL) 2 % cream Apply 1 Application topically daily. 09/22/22   [provider]  Methoxy PEG-Epoetin Beta (MIRCERA IJ) Mircera 03/03/22 03/02/23  [provider]  Olopatadine HCl (PATADAY) 0.2 % SOLN Apply 1 drop to eye daily. 10/12/22   [name], MD  vancomycin [facility]) 1-5 GM/200ML-% SOLN Inject 200 mLs (1,000 mg total) into the vein every Monday, Wednesday, and Friday with hemodialysis. 10/06/22   Jonetta Osgood, MD    ___________________________________________________________________________________________________ Physical Exam:    10/21/2022   10:15 PM 10/21/2022    8:47 PM 10/21/2022    7:50 PM  Vitals with BMI  Systolic 938 101 751  Diastolic 97 82 95  Pulse 83 80 81     1. General:  in No  Acute distress   Chronically ill   -appearing 2. Psychological: Alert and  Oriented 3. Head/ENT:  Dry Mucous Membranes                          Head Non traumatic, neck supple                          Poor Dentition 4. SKIN: decreased Skin turgor,  Skin clean Dry and intact no rash 5. Heart: Regular rate and rhythm no  Murmur, no Rub or gallop 6. Lungs:  no wheezes or crackles   7. Abdomen: Soft,  non-tender, Non distended   obese   8. Lower extremities: no clubbing, cyanosis, no  edema sp Right BKA 9. Neurologically  strength 4 out of 5 in all 4 extremities cranial nerves II through XII intact  10. MSK: Normal range of motion    Chart has been reviewed  ______________________________________________________________________________________________  Assessment/Plan 51 y.o. 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)  - will admit based on TIA/CVA protocol,        Monitor on Tele       /MRI  Resulted - showing acute ischemic CVA        CTA not ordered allergic to contrast  will order bilateral dopplers and MRA brain      Patient has known mitral valve very vegetation and a recent echo embolic source,        obtain cardiac enzymes,  ECG,   Lipid panel, TSH.        Order PT/OT evaluation.        keep nothing by mouth until passes swallow eval        Will make sure patient  is on antiplatelet ASA 81  Discussed with neurology we will hold off on Eliquis for tonight for at least 2 days Check lipid panel statins if indicated LDL above 70       Allow permissive Hypertension keep BP <220/120        Neurology consulted  will see in AM   Diabetes mellitus, type II, insulin dependent ([place]), on CGM, with end organ damage: renal, neuropathy, gastroparesis, retinopathy Order sliding scale continue Tresiba 20 units subcu nightly  Hypertension associated with diabetes ([place]) Allow permissive hypertension  Anemia in chronic kidney disease, on chronic dialysis ([place]) Chronic stable follow hemoglobin  ESRD on dialysis [facility]) ER provider to notify nephrology of patient's been admitted will need HD in a.m.  S/P BKA (below knee amputation), right (HCC) Chronic stable  Endocarditis Deferred to nephrology need to continue vancomycin and cefepime with hemodialysis   Other plan as per orders.  DVT prophylaxis:  SCD     Code Status:    Code Status: Prior FULL CODE  as per patient   I had personally discussed CODE STATUS with patient and family    Family Communication:   Family   at  Bedside  plan of care was discussed  with Husband,    Disposition Plan:     To home once workup is complete and patient is stable   Following barriers for discharge:                                                 Will need consultants to evaluate patient prior to discharge                     Would benefit from PT/OT eval prior to DC  Ordered   Consults called: Neurology aware. ER to send message to nephrology  Admission status:  ED Disposition     ED Disposition  [place]: [place] [[id number]]  Level of Care: Telemetry Medical [[id number]]  May admit patient to [facility] or [facility] if equivalent level of care is available:: No  Covid Evaluation: Asymptomatic - no recent exposure (last 10 days) testing not required  Diagnosis: Stroke [facility]) [[id number]]  Admitting Physician: Toy Baker [[id number]]  Attending Physician: Toy Baker [[id number]]  Certification:: I certify this patient will need inpatient services for at least 2 midnights  Estimated Length of Stay: 2            inpatient     I Expect 2 midnight stay secondary to severity of patient's current illness need for inpatient interventions justified by the following:     Severe lab/radiological/exam abnormalities including:    New embolic cva and extensive comorbidities including:  DM2   CKD   History of amputation Chronic anticoagulation  That are currently affecting medical management.   I expect  patient to be hospitalized for 2 midnights requiring inpatient medical care.  Patient is at high risk for adverse outcome (such as loss of life or disability) if not treated.  Indication for inpatient stay as follows:    Need for operative/procedural  intervention     Need for stroke work up    Level of care   ele indefinitely please discontinue once patient no longer qualifies COVID-19 Labs   [employer]  Landry Kamath 10/21/2022, 11:58 PM    [facility]     after 2 AM please page floor coverage PA If 7AM-7PM, please contact the day team taking care of the patient using [email or web address]   Patient was evaluated in the context of the global COVID-19 pandemic, which necessitated consideration that the patient might be at risk for infection with the SARS-CoV-2 virus that causes COVID-19. Institutional protocols and algorithms that pertain to the evaluation of patients at risk for COVID-19 are in a state of rapid change based on information released by regulatory bodies including the CDC and federal and state organizations. These policies and algorithms were followed during the patient's care.

## 2022-10-22 ENCOUNTER — Inpatient Hospital Stay (HOSPITAL_COMMUNITY): Payer: Medicare Other

## 2022-10-22 DIAGNOSIS — N186 End stage renal disease: Secondary | ICD-10-CM | POA: Diagnosis not present

## 2022-10-22 DIAGNOSIS — I639 Cerebral infarction, unspecified: Secondary | ICD-10-CM | POA: Diagnosis not present

## 2022-10-22 DIAGNOSIS — I63413 Cerebral infarction due to embolism of bilateral middle cerebral arteries: Secondary | ICD-10-CM | POA: Diagnosis not present

## 2022-10-22 DIAGNOSIS — I330 Acute and subacute infective endocarditis: Secondary | ICD-10-CM | POA: Diagnosis not present

## 2022-10-22 DIAGNOSIS — Z992 Dependence on renal dialysis: Secondary | ICD-10-CM | POA: Diagnosis not present

## 2022-10-22 LAB — CBG MONITORING, ED
Glucose-Capillary: 220 mg/dL — ABNORMAL HIGH (ref 70–99)
Glucose-Capillary: 211 mg/dL — ABNORMAL HIGH (ref 70–99)
Glucose-Capillary: 145 mg/dL — ABNORMAL HIGH (ref 70–99)
Glucose-Capillary: 192 mg/dL — ABNORMAL HIGH (ref 70–99)

## 2022-10-22 LAB — RENAL FUNCTION PANEL
Albumin: 3 g/dL — ABNORMAL LOW (ref 3.5–5.0)
Anion gap: 13 (ref 5–15)
BUN: 33 mg/dL — ABNORMAL HIGH (ref 6–20)
CO2: 27 mmol/L (ref 22–32)
Calcium: 10.1 mg/dL (ref 8.9–10.3)
Chloride: 101 mmol/L (ref 98–111)
Creatinine, Ser: 11.19 mg/dL — ABNORMAL HIGH (ref 0.44–1.00)
GFR, Estimated: 4 mL/min — ABNORMAL LOW (ref >60)
Glucose, Bld: 182 mg/dL — ABNORMAL HIGH (ref 70–99)
Phosphorus: 5.2 mg/dL — ABNORMAL HIGH (ref 2.5–4.6)
Potassium: 3.6 mmol/L (ref 3.5–5.1)
Sodium: 141 mmol/L (ref 135–145)

## 2022-10-22 LAB — GLUCOSE, CAPILLARY
Glucose-Capillary: 202 mg/dL — ABNORMAL HIGH (ref 70–99)
Glucose-Capillary: 157 mg/dL — ABNORMAL HIGH (ref 70–99)

## 2022-10-22 LAB — HEPATITIS B SURFACE ANTIGEN: Hepatitis B Surface Ag: NONREACTIVE

## 2022-10-22 MED ORDER — ACETAMINOPHEN 160 MG/5ML PO SOLN: 650.0000 mg | ORAL | Status: DC | PRN

## 2022-10-22 MED ORDER — GABAPENTIN 300 MG PO CAPS
300.0000 mg | ORAL_CAPSULE | ORAL | Status: DC
  Administered 2022-10-22: 300 mg via ORAL
  Filled 2022-10-22: qty 1

## 2022-10-22 MED ORDER — AMLODIPINE BESYLATE 5 MG PO TABS: 5.0000 mg | ORAL_TABLET | Freq: Once | ORAL | Status: DC

## 2022-10-22 MED ORDER — ACETAMINOPHEN 650 MG RE SUPP: 650.0000 mg | RECTAL | Status: DC | PRN

## 2022-10-22 MED ORDER — DOXERCALCIFEROL 2.5 MCG PO CAPS: 3.0000 ug | ORAL_CAPSULE | ORAL | Status: DC

## 2022-10-22 MED ORDER — STROKE: EARLY STAGES OF RECOVERY BOOK
Freq: Once | Status: DC
  Filled 2022-10-22 (×2): qty 1

## 2022-10-22 MED ORDER — HEPARIN SODIUM (PORCINE) 5000 UNIT/ML IJ SOLN
5000.0000 [IU] | Freq: Three times a day (TID) | INTRAMUSCULAR | Status: DC
  Administered 2022-10-22 – 2022-10-24 (×5): 5000 [IU] via SUBCUTANEOUS
  Filled 2022-10-22 (×5): qty 1

## 2022-10-22 MED ORDER — ACETAMINOPHEN 325 MG PO TABS: 650.0000 mg | ORAL_TABLET | Freq: Four times a day (QID) | ORAL | Status: DC | PRN

## 2022-10-22 MED ORDER — ASPIRIN 81 MG PO TBEC: 81.0000 mg | DELAYED_RELEASE_TABLET | Freq: Every day | ORAL | Status: DC

## 2022-10-22 MED ORDER — DIPHENHYDRAMINE HCL 25 MG PO CAPS
25.0000 mg | ORAL_CAPSULE | Freq: Once | ORAL | Status: AC | PRN
  Administered 2022-10-22: 25 mg via ORAL
  Filled 2022-10-22: qty 1

## 2022-10-22 MED ORDER — INSULIN ASPART 100 UNIT/ML IJ SOLN
0.0000 [IU] | Freq: Three times a day (TID) | INTRAMUSCULAR | Status: DC
  Administered 2022-10-22: 2 [IU] via SUBCUTANEOUS
  Administered 2022-10-22 – 2022-10-23 (×4): 1 [IU] via SUBCUTANEOUS
  Filled 2022-10-22: qty 0.06

## 2022-10-22 MED ORDER — BUTALBITAL-APAP-CAFFEINE 50-325-40 MG PO TABS
1.0000 | ORAL_TABLET | ORAL | Status: DC | PRN
  Administered 2022-10-22 (×2): 1 via ORAL
  Filled 2022-10-22 (×2): qty 1

## 2022-10-22 MED ORDER — INSULIN DEGLUDEC 100 UNIT/ML ~~LOC~~ SOPN: 20.0000 [IU] | PEN_INJECTOR | Freq: Every day | SUBCUTANEOUS | Status: DC

## 2022-10-22 MED ORDER — INSULIN GLARGINE-YFGN 100 UNIT/ML ~~LOC~~ SOLN
20.0000 [IU] | Freq: Every day | SUBCUTANEOUS | Status: DC
  Administered 2022-10-23 (×2): 20 [IU] via SUBCUTANEOUS
  Filled 2022-10-22 (×4): qty 0.2

## 2022-10-22 MED ORDER — ACETAMINOPHEN 325 MG PO TABS: 650.0000 mg | ORAL_TABLET | ORAL | Status: DC | PRN

## 2022-10-22 MED ORDER — CHLORHEXIDINE GLUCONATE CLOTH 2 % EX PADS
6.0000 | MEDICATED_PAD | Freq: Every day | CUTANEOUS | Status: DC
  Administered 2022-10-23 – 2022-10-24 (×2): 6 via TOPICAL

## 2022-10-22 MED ORDER — CHLORHEXIDINE GLUCONATE CLOTH 2 % EX PADS
6.0000 | MEDICATED_PAD | Freq: Every day | CUTANEOUS | Status: DC
  Administered 2022-10-22 – 2022-10-23 (×2): 6 via TOPICAL

## 2022-10-22 MED ORDER — CALCIUM ACETATE (PHOS BINDER) 667 MG PO CAPS
2001.0000 mg | ORAL_CAPSULE | Freq: Three times a day (TID) | ORAL | Status: DC
  Administered 2022-10-22 – 2022-10-24 (×4): 2001 mg via ORAL
  Filled 2022-10-22 (×5): qty 3

## 2022-10-22 MED ORDER — HEPARIN (PORCINE) 25000 UT/250ML-% IV SOLN
1200.0000 [IU]/h | INTRAVENOUS | Status: DC
  Administered 2022-10-22: 1200 [IU]/h via INTRAVENOUS
  Filled 2022-10-22: qty 250

## 2022-10-22 NOTE — Consult Note (Signed)
NEUROLOGY CONSULTATION NOTE   Date of service: October 22, 2022 Patient Name: Heather [name] MRN:  [id number] DOB:  1971/06/17 Reason for consult: "embolic strokes" Requesting Provider: Toy Baker, MD _ _ _   _ __   _ __ _ _  __ __   _ __   __ _  History of Present Illness  Heather [name] is a 51 y.o. 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  She got flu shot on 10/25 and felt R arm and R leg numbness and incoordination. Symptoms did not improve so she came to the ED.  She was recently admitted to the hospital for further evaluation of the noted mitral vegetation. She was discharged on Vanc/cefepime for 6 weeks for the vegetation and had a port placed to get antibiotics during her HD session. She reports that she was not sure if she was supposed to continue her eliquis and thus has not had any eliquis since she was discahrged on 10/05/22.  LKW: 10/20/22 mRS: 0 tNKASE: not offered, outside window Thrombectomy: not offered, outside window NIHSS components Score: Comment  1a Level of Conscious 0'[x]'$  1'[]'$  2'[]'$  3'[]'$      1b LOC Questions 0'[x]'$  1'[]'$  2'[]'$       1c LOC Commands 0'[x]'$  1'[]'$  2'[]'$       2 Best Gaze 0'[x]'$  1'[]'$  2'[]'$       3 Visual 0'[x]'$  1'[]'$  2'[]'$  3'[]'$      4 Facial Palsy 0'[x]'$  1'[]'$  2'[]'$  3'[]'$      5a Motor Arm - left 0'[x]'$  1'[]'$  2'[]'$  3'[]'$  4'[]'$  UN'[]'$    5b Motor Arm - Right 0'[x]'$  1'[x]'$  2'[]'$  3'[]'$  4'[]'$  UN'[]'$    6a Motor Leg - Left 0'[x]'$  1'[]'$  2'[]'$  3'[]'$  4'[]'$  UN'[]'$    6b Motor Leg - Right 0'[]'$  1'[x]'$  2'[]'$  3'[]'$  4'[]'$  UN'[]'$    7 Limb Ataxia 0'[]'$  1'[x]'$  2'[]'$  3'[]'$  UN'[]'$     8 Sensory 0'[x]'$  1'[]'$  2'[]'$  UN'[]'$      9 Best Language 0'[x]'$  1'[]'$  2'[]'$  3'[]'$      10 Dysarthria 0'[x]'$  1'[]'$  2'[]'$  UN'[]'$      11 Extinct. and Inattention 0'[x]'$  1'[]'$  2'[]'$       TOTAL: 3     ROS   Constitutional Denies weight  loss, fever and chills.   HEENT Denies changes in vision and hearing.   Respiratory Denies SOB and cough.   CV Denies palpitations and CP   GI Denies abdominal pain, nausea, vomiting and diarrhea.   GU Denies dysuria and urinary frequency.   MSK Denies myalgia and joint pain.   Skin Denies rash and pruritus.   Neurological Denies headache and syncope.   Psychiatric Denies recent changes in mood. Denies anxiety and depression.    Past History   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[place]), nonhemorrhagic, inferior right cerebellum 12/03/2017   left side weakness in leg   Cyst of left ovary 01/12/2018   Diabetic neuropathy, painful ([place]), on low dose Gabapentin 07/13/2013   DM (diabetes mellitus), type 2, uncontrolled, with renal complications 59/56/3875   DM2 (diabetes mellitus, type 2) ([place])    Dysfunction of left eustachian tube,  with pusatile tinnitus 11/24/2017   Dyslipidemia associated with type 2 diabetes mellitus ([place]) 06/25/2016   ESRD (end stage renal disease) on dialysis [facility])    On hemodialysis in July 2019 via [facility] then switched to [facility] in Nov 2019.    ESRD with anemia (HCC)    Fibromyalgia    Gastroparesis due to DM    GERD (gastroesophageal reflux disease)    Headache    Hypertension associated with diabetes ([place]) 02/19/2011   IBS (irritable bowel syndrome)    Left-sided weakness 12/10/2017   .   Leiomyoma of uterus    Pancreatitis    PE (pulmonary thromboembolism) ([place]) 11/04/2019   Pneumonia    Proliferative diabetic retinopathy ([place]) 09/29/2015   Pronation deformity of both feet 06/14/2014   Reactive depression 12/10/2017   .   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[place])  02/17/2018   Thromboembolism ([place]) 04/05/2018   Upper airway cough syndrome, with recs to stay off ACE and take Pepcid q hs 11/15/2016   Past Surgical History:  Procedure Laterality Date   A/V FISTULAGRAM Right 03/03/2020   Procedure: Venogram;  Surgeon: Waynetta Sandy, MD;  Location: [place] [facility];  Service: Cardiovascular;  Laterality: Right;   ARTERY REPAIR Right 02/17/2018   Procedure: EXPLORATION OF RIGHT BRACHIAL ARTERY;  Surgeon: Conrad [place], MD;  Location: [place];  Service: Vascular;  Laterality: Right;   ARTERY REPAIR Right 02/17/2018   Procedure: BRACHIAL ARTERY EXPLORATION AND TRHOMBECTOMY;  Surgeon: Conrad [place], MD;  Location: [place];  Service: Vascular;  Laterality: Right;   AV FISTULA PLACEMENT Left 05/09/2017   Procedure: INSERTION OF ARTERIOVENOUS (AV) GRAFT ARM (ARTEGRAFT);  Surgeon: Conrad [place], MD;  Location: [place];  Service: Vascular;  Laterality: Left;   AV FISTULA PLACEMENT Right 02/17/2018   Procedure: INSERTION OF ARTERIOVENOUS (AV) GORE-TEX GRAFT ARM RIGHT UPPER ARM;  Surgeon: Conrad [place], MD;  Location: [place];  Service: Vascular;  Laterality: Right;   AV FISTULA PLACEMENT Left 10/10/2019   Procedure: INSERTION OF LEFT ARTERIOVENOUS (AV) ARTEGRAFT GRAFT ARM;  Surgeon: Waynetta Sandy, MD;  Location: [place];  Service: Vascular;  Laterality: Left;   AV FISTULA PLACEMENT Right 03/19/2020   Procedure: INSERTION OF ARTERIOVENOUS (AV) GORE-TEX GRAFT IN RIGHT ARM AND VIABAHN STENT IN RIGHT AXILLARY ARTERY;  Surgeon: Waynetta Sandy, MD;  Location: [place];  Service: Vascular;  Laterality: Right;   [place] Left 08/31/2016   Procedure: BASILIC VEIN TRANSPOSITION FIRST STAGE;  Surgeon: Conrad [place], MD;  Location: [facility] OR;  Service: Vascular;  Laterality: Left;   BRONCHIAL WASHINGS  09/17/2021   Procedure: BRONCHIAL WASHINGS;  Surgeon: Collene Gobble, MD;  Location: [facility];  Service: Cardiopulmonary;;   CENTRAL VENOUS CATHETER  INSERTION Left 07/24/2018   Procedure: INSERTION CENTRAL LINE ADULT;  Surgeon: Conrad [place], MD;  Location: [place];  Service: Vascular;  Laterality: Left;   EYE SURGERY     secondary to diabetic retinopathy    FOOT SURGERY Right    t"ook bone out- maybe hammer toe"   INSERTION OF DIALYSIS CATHETER Left 07/24/2018   Procedure: INSERTION OF TUNNELED DIALYSIS CATHETER;  Surgeon: Conrad Delleker, MD;  Location: [place];  Service: Vascular;  Laterality: Left;   IR FLUORO GUIDE CV LINE LEFT  12/19/2021   IR FLUORO GUIDE CV LINE LEFT  12/29/2021   IR FLUORO GUIDE CV  LINE RIGHT  07/21/2018   IR FLUORO GUIDE CV LINE RIGHT  06/01/2019   IR FLUORO GUIDE CV LINE RIGHT  05/05/2020   IR FLUORO GUIDE CV LINE RIGHT  10/04/2022   IR REMOVAL TUN CV CATH W/O FL  01/23/2021   IR REMOVAL TUN CV CATH W/O FL  12/19/2021   IR REMOVAL TUN CV CATH W/O FL  10/01/2022   IR US GUIDE VASC ACCESS LEFT  12/19/2021   IR US GUIDE VASC ACCESS RIGHT  07/21/2018   IR US GUIDE VASC ACCESS RIGHT  06/01/2019   IR US GUIDE VASC ACCESS RIGHT  10/04/2022   LIGATION ARTERIOVENOUS GORTEX GRAFT Right 03/20/2020   Procedure: LIGATION ARM ARTERIOVENOUS GORTEX GRAFT With Patch Angioplasty, Thrombectomy;  Surgeon: Waynetta Sandy, MD;  Location: [place];  Service: Vascular;  Laterality: Right;   LIGATION OF ARTERIOVENOUS  FISTULA Left 05/09/2017   Procedure: LIGATION OF ARTERIOVENOUS  FISTULA;  Surgeon: Conrad [place], MD;  Location: [place];  Service: Vascular;  Laterality: Left;   LOOP RECORDER INSERTION N/A 12/05/2017   Procedure: LOOP RECORDER INSERTION;  Surgeon: Evans Lance, MD;  Location: [place] [facility];  Service: Cardiovascular;  Laterality: N/A;   MYOMECTOMY     REMOVAL OF GRAFT Right 02/17/2018   Procedure: REMOVAL OF RIGHT UPPER ARM ARTERIOVENOUS GRAFT;  Surgeon: Conrad Ronne[place], MD;  Location: [place];  Service: Vascular;  Laterality: Right;   REVISION OF ARTERIOVENOUS GORETEX GRAFT Left 07/24/2018   Procedure: REDO ARTERIOVENOUS  GORETEX GRAFT;  Surgeon: Conrad [place], MD;  Location: [place];  Service: Vascular;  Laterality: Left;   TEE WITHOUT CARDIOVERSION N/A 12/05/2017   Procedure: TRANSESOPHAGEAL ECHOCARDIOGRAM (TEE);  Surgeon: Sanda Klein, MD;  Location: [place];  Service: Cardiovascular;  Laterality: N/A;   TEE WITHOUT CARDIOVERSION N/A 09/30/2022   Procedure: TRANSESOPHAGEAL ECHOCARDIOGRAM (TEE);  Surgeon: Jerline Pain, MD;  Location: [facility] ENDOSCOPY;  Service: Cardiovascular;  Laterality: N/A;   UPPER EXTREMITY VENOGRAPHY Left 07/13/2018   Procedure: UPPER EXTREMITY VENOGRAPHY - Central & Left Arm;  Surgeon: Conrad [place], MD;  Location: [place] [facility];  Service: Cardiovascular;  Laterality: Left;   UPPER EXTREMITY VENOGRAPHY Bilateral 09/10/2019   Procedure: UPPER EXTREMITY VENOGRAPHY;  Surgeon: Waynetta Sandy, MD;  Location: [place] [facility];  Service: Cardiovascular;  Laterality: Bilateral;   UTERINE FIBROID SURGERY     VIDEO BRONCHOSCOPY N/A 09/17/2021   Procedure: VIDEO BRONCHOSCOPY WITHOUT FLUORO;  Surgeon: Collene Gobble, MD;  Location: [facility];  Service: Cardiopulmonary;  Laterality: N/A;   VITRECTOMY Bilateral    Family History  Problem Relation Age of Onset   Colon polyps Mother    Diabetes Mother    Heart murmur Father        history essentially unknown   Hypertension Father    Diabetes Sister    Kidney failure Sister        kidney transplant   Diabetes Brother    Retinal degeneration Brother    Heart murmur Son    Stroke Paternal Grandmother    Diabetes Sister    Social History   Socioeconomic History   Marital status: Married    Spouse name: Not on file   Number of children: 1   Years of education: college   Highest education level: Not on file  Occupational History   Occupation: customer service rep    Employer: [employer]  Tobacco Use   Smoking status: Never   Smokeless tobacco:  Never  Vaping Use   Vaping Use: Never used  Substance and  Sexual Activity   Alcohol use: No    Alcohol/week: 0.0 standard drinks of alcohol   Drug use: No   Sexual activity: Not Currently    Partners: Male    Birth control/protection: I.U.D.  Other Topics Concern   Not on file  Social History Narrative   Patient lives with fiance. She has 1 grown son. Works full-time, she Paramedic for attorney.   Caffeine Use: 2 cups daily; sodas occasionally   She has 7 grandchildren   Social Determinants of Radio broadcast assistant Strain: Not on file  Food Insecurity: Not on file  Transportation Needs: Not on file  Physical Activity: Not on file  Stress: Not on file  Social Connections: Not on file   Allergies  Allergen Reactions   Ibuprofen Other (See Comments)    CKD stage 3. Should avoid.   Dilaudid [Hydromorphone Hcl] Itching and Other (See Comments)    Can take with Benadryl.   Iodinated Contrast Media Rash   Tramadol Itching    Medications  (Not in a hospital admission)    Vitals   Vitals:   10/22/22 0130 10/22/22 0145 10/22/22 0400 10/22/22 0445  BP: (!) 138/49 133/62  112/61  Pulse: 74 73  71  Resp:  18  16  Temp:   97.7 F (36.5 C)   TempSrc:   Oral   SpO2: 96% 92%  93%  Weight:      Height:         Body mass index is 33.52 kg/m.  Physical Exam   General: Laying comfortably in bed; in no acute distress.  HENT: Normal oropharynx and mucosa. Normal external appearance of ears and nose.  Neck: Supple, no pain or tenderness  CV: No JVD. No peripheral edema.  Pulmonary: Symmetric Chest rise. Normal respiratory effort.  Abdomen: Soft to touch, non-tender.  Ext: No cyanosis, edema, or deformity  Skin: No rash. Normal palpation of skin.   Musculoskeletal: Normal digits and nails by inspection. No clubbing.   Neurologic Examination  Mental status/Cognition: Alert, oriented to self, place, month and year, good attention.  Speech/language: Fluent, comprehension intact, object naming intact, repetition intact.   Cranial nerves:   CN II Pupils equal and reactive to light, no VF deficits    CN III,IV,VI EOM intact, no gaze preference or deviation, no nystagmus    CN V normal sensation in V1, V2, and V3 segments bilaterally    CN VII no asymmetry, no nasolabial fold flattening    CN VIII normal hearing to speech    CN IX & X normal palatal elevation, no uvular deviation    CN XI 5/5 head turn and 5/5 shoulder shrug bilaterally   CN XII midline tongue protrusion    Motor:  Muscle bulk: normal, tone normal, pronator drift noted in RUE. tremor none Mvmt Root Nerve  Muscle Right Left Comments  SA C5/6 Ax Deltoid 3 5   EF C5/6 Mc Biceps 4 5   EE C6/7/8 Rad Triceps 4 5   WF C6/7 Med FCR     WE C7/8 PIN ECU     F Ab C8/T1 U ADM/FDI 4 5   HF L1/2/3 Fem Illopsoas 4 5   KE L2/3/4 Fem Quad 3 5   DF L4/5 D Peron Tib Ant  5 Right BKA  PF S1/2 Tibial Grc/Sol  5    Reflexes:  Right Left Comments  Pectoralis      Biceps (C5/6) 2 2   Brachioradialis (C5/6) 1 1    Triceps (C6/7) 2 2    Patellar (L3/4) 1 1    Achilles (S1)      Hoffman      Plantar     Jaw jerk    Sensation:  Light touch Intact objectivey but has subjective tingling sensation on the right   Pin prick    Temperature    Vibration   Proprioception    Coordination/Complex Motor:  - Finger to Nose mild ataxia in RUE - Heel to shin unable to do due to Right BKA. - Rapid alternating movement are slowed on the right - Gait: Deferred.  Labs   CBC:  Recent Labs  Lab 10/21/22 1828  WBC 6.5  HGB 11.6*  HCT 39.5  MCV 104.5*  PLT 814    Basic Metabolic Panel:  Lab Results  Component Value Date   NA 141 10/21/2022   K 3.6 10/21/2022   CO2 29 10/21/2022   GLUCOSE 221 (H) 10/21/2022   BUN 30 (H) 10/21/2022   CREATININE 9.37 (H) 10/21/2022   CALCIUM 9.7 10/21/2022   GFRNONAA 5 (L) 10/21/2022   GFRAA 4 (L) 05/07/2020   Lipid Panel:  Lab Results  Component Value Date   LDLCALC 169 (H) 12/03/2017   HgbA1c:  Lab  Results  Component Value Date   HGBA1C 7.6 (H) 09/30/2022   Urine Drug Screen:     Component Value Date/Time   LABOPIA NONE DETECTED 07/11/2018 2004   COCAINSCRNUR NONE DETECTED 07/11/2018 2004   LABBENZ NONE DETECTED 07/11/2018 2004   AMPHETMU NONE DETECTED 07/11/2018 2004   THCU NONE DETECTED 07/11/2018 2004   LABBARB (A) 07/11/2018 2004    Result not available. Reagent lot number recalled by manufacturer.    Alcohol Level No results found for: "ETH"  CT Head without contrast(Personally reviewed): CTH was negative for a large hypodensity concerning for a large territory infarct or hyperdensity concerning for an ICH  MR Angio head without contrast and Carotid Duplex BL(Personally reviewed): pending  MRI Brain(Personally reviewed): Acute infarct in the left paramedian frontoparietal region, with punctate additional acute infarcts in the left frontal cortex and right occipital cortex. Given multiple vascular territories, an embolic etiology is suspected.  Impression   Heather [name] is a 51 y.o. 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  She was found to have embolic appearing infarcts in the L paramedian frontoparietal region along with additional acute infarcts in left frontal cortex and R occipital cortex.  Etiology of her strokes is likely cardioembolic in the setting of known L mitral valve vegetation and not taking her eliquis for 2 weeks. Could also be a paradoxical emboli given hx of DVTs/PE.  Recommendations   - Frequent Neuro checks per stroke unit protocol - Recommend Vascular imaging with MRA Angio Head without contrast and US Carotid doppler - Recommend obtaining Lipid panel  with LDL - Please start statin if LDL > 70 - Recommend HbA1c - Antithrombotic - Aspirin '81mg'$  daily for now. Strokes due to endocarditis are high risk for hemorrhage. Would recommend discussing with stroke team to get their input on this. Ideally, would like to hold off on  Anticoagulation for a few days. - Recommend DVT ppx for now. Please discontinue when she is started back on Anticoagulation. - SBP goal - aim for gradual normotension. - Recommend Telemetry monitoring for arrythmia - Recommend bedside swallow screen prior to PO intake. - Stroke education booklet - Recommend PT/OT/SLP consult - Lower extremity dopplers. ______________________________________________________________________   Thank you for the opportunity to take part in the care of this patient. If you have any further questions, please contact the neurology consultation attending.  Signed,  [place] Pager Number [phone] _ _ _   _ __   _ __ _ _  __ __   _ __   __ _

## 2022-10-22 NOTE — Progress Notes (Addendum)
Stroke Team Consult Note    Ref MD : Deno Etienne singh Reason : stroke SUBJECTIVE Heather [name] is a 51 y.o. 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  She got flu shot on 10/25 and felt R arm and R leg numbness and incoordination. Symptoms did not improve so she came to the ED.  She was recently admitted to the hospital for urther evaluation of the noted mitral vegetation. She was discharged on Vanc/cefepime for 6 weeks for the vegetation and had a port placed to get antibiotics during her HD session. She reports that she was not sure if she was supposed to continue her eliquis and thus has not had any eliquis since she was discahrged on 10/05/22.  Patient just transferred from [facility] long hospital to [facility].  She states she is having headaches and she has history of migraine headaches and headache is quite severe.  She also complains of numbness and paresthesias on the right hand and right lower extremity is also bothersome.  MRI scan of the brain shows small acute infarcts in the left paramedian frontoparietal region and punctate infarcts in left frontal cortex and right occipital cortex.  MR angiogram of the brain shows progressive intracranial arterial changes involving moderate to severe left M2, moderate right M2 moderate bilateral supraclinoid ICA stenosis which has progressed compared to previous study from 2019.  Carotid ultrasound shows no significant extracranial stenosis.  Lower extremity venous Dopplers are negative for DVT. Lipid profile is pending.  Patient is currently on IV heparin drip TEE on 09/30/2022 showed large vegetation on the mitral valve dilated left atrium with no thrombus. OBJECTIVE Most recent Vital  Signs: Temp: 97.6 F (36.4 C) (10/27 1653) Temp Source: Oral (10/27 1653) BP: 187/88 (10/27 1653) Pulse Rate: 79 (10/27 1653) Respiratory Rate: 16 O2 Saturdation: 98%   CBG (last 3)  Recent Labs    10/22/22 0739 10/22/22 1151 10/22/22 1656  GLUCAP 145* 192* 202*       Studies:   MRI scan of the brain shows small acute infarcts in the left paramedian frontoparietal region and punctate infarcts in left frontal cortex and right occipital cortex.   MR angiogram of the brain shows progressive intracranial arterial changes involving moderate to severe left M2, moderate right M2 moderate bilateral supraclinoid ICA stenosis which has progressed compared to previous study from 2019.   Carotid ultrasound shows no significant extracranial stenosis.   Lower extremity venous Dopplers are negative for DVT. Lipid profile is pending.    HbA1cpending  Physical Exam:     Pleasant obese middle-aged African-American lady not in distress.  She has right below-knee amputation. . Afebrile. Head is nontraumatic. Neck is supple without bruit.    Cardiac exam no murmur or gallop. Lungs are clear to auscultation. Distal pulses are well felt.  .  Neurological Exam ;  Awake  Alert oriented x 3. Normal speech and language.eye movements full without nystagmus.fundi were not visualized. Vision acuity and fields appear normal. Hearing is normal. Palatal movements are normal. Face symmetric. Tongue midline. Normal strength, tone, reflexes and coordination. Normal sensation but subjective paresthesias in the right side.. Gait deferred.  Right below-knee amputation. ASSESSMENT [name] is a  51 y.o. 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 Patient has history of remote DVT and had been on Eliquis but states she has been off it now for the last 3 weeks.  Present lower extremity venous Doppler shows no residual clot hence I do not believe anticoagulation is necessary Hospital day # 1  TREATMENT/PLAN  I have personally examined this patient, reviewed notes, independently viewed imaging studies, participated in medical decision making and plan of care.ROS completed by me personally and pertinent positives fully documented  I have made any additions or clarifications directly to the above note. Agree with note above.  Discontinue IV heparin as patient does not have active DVT and anticoagulation may be dangerous in setting of bacterial endocarditis. Continue antibiotics for bacterial endocarditis as per ID. Continue aspirin 81 mg daily alone and aggressive risk factor modification. Check lipid profile and hemoglobin A1c. Physical occupational and speech therapy consults.  Mobilize out of bed as tolerated. Thank you for this consult.  Stroke team will be available as needed.  Discussed with Dr. Candiss Norse  I spent 60  minutes in total face-to-face time with the patient, more than 50% of which was spent in counseling and coordination of care, reviewing test results, reviewing medication and discussing or reviewing the diagnosis of embolic strokes and bacterial endocarditis embolic stroke and bacterial endocarditis, the prognosis and treatment options.   Antony Contras, MD Medical Director [facility] Pager: [phone] 10/22/2022 5:23 PM

## 2022-10-22 NOTE — Plan of Care (Signed)
Neurology brief update  Please see neurology consult note from Dr. Lorrin Goodell earlier this AM for full findings and recommendations. I have discussed the case with Dr. Leonie Man neurovascular attending at [facility], and we are in agreement that withholding anticoagulation in this patient is higher risk than continuing it. Specifically, patients on anticoagulation after acute ischemic stroke are at higher risk of hemorrhagic conversion.  However given that she has an indication for anticoagulation for DVT and PE, and had an embolic stroke after being off Eliquis for only 2 weeks, she is at high risk for further embolic events if anticoagulation is withheld.  The risk for further embolic events off anticoagulation is felt to be higher than the risk of hemorrhagic conversion on anticoagulation.  I discussed this with Dr. Thereasa Solo and recommended that she be started on heparin gtt with no bolus. From my understanding patient is not being actively considered for surgery for the mitral valve vegetation at this time. If she does not have complications on the heparin she can be transitioned back to [place] prior to discharge (neurology will give further guidance at that time).   Patient is awaiting transfer to [facility] hospitalist service, after which she will be followed in consultation by the stroke team.  Su Monks, MD [facility] [phone]  If 7pm- 7am, please page neurology on call as listed in [place].

## 2022-10-22 NOTE — Progress Notes (Addendum)
Heather [name]  JHE:[id number] DOB: 09-Mar-1971 DOA: 10/21/2022 PCP: [name], MD    Brief Narrative:  51 year old with a history of ESRD on HD MWF, DM 2 with neuropathy, right BKA 2019, DVT and PE on chronic Eliquis, hospitalization 10/5 > 10/10 for large mitral valve vegetation with culture-negative endocarditis (on a 6-week course of vancomycin plus cefepime through 11/12/2022), and CVA in 2018 with no residual deficits who presented to the ER with the onset of right upper extremity and right lower extremity numbness with discoordination. Of note the patient had not been taking her Eliquis since 10/10 due to her misunderstanding her instructions (it is listed on her d/c med list).   Consultants:  None  Goals of Care:  Code Status: Full Code   DVT prophylaxis: SCDs  Interim Hx: Afebrile.  Blood pressure modestly elevated but within allowable range.  Vital signs otherwise stable.  Resting comfortably on the stretcher in the ER at the time of my visit.  Is alert and oriented.  Reports that she still has poor sensation in her right arm and feels that her right lower extremity is discoordinated.  Has not lost significant strength in right upper or lower extremity per her report.  Assessment & Plan:  Acute embolic appearing CVA's left paramedian frontoparietal region and left frontal cortex and right occipital cortex Work-up as per Neurology/Stroke Team - awaiting transfer to [place] discussed her care w/ Neurology this morning, and given the high likelihood these CVAs are embolic in nature due to her MV vegetation, it is felt that full anticoag is the best tx course - she will need to be monitored for hemorrhagic conversion - I have discussed this with the patient who understands - IV heparin will be utilized for now   MRA head -no evidence of acute vessel occlusion but notable for progressive intracranial atherosclerosis since 2019 with moderate to severe left M2 and  moderate right M2 origin stenosis and moderate bilateral supraclinoid ICA stenosis increased on the right Carotid doppler pending LDL pending  A1c 7.6 09/30/22 Initiate heparin gtt due to risk for further emboli  PT/OT/SLP B LE venous duplex pending   Culture-negative endocarditis Suspected source of embolic CVAs in absence of Eliquis -continue antibiotic as prescribed by ID service - heparin gtt to be initiated until safe to return to Eliquis dosing   DM2 w/ neuropathy and renal disease CBG currently reasonably controlled  HTN Permissive hypertension for now  Anemia of chronic kidney disease Hemoglobin stable presently  ESRD on HD via dialysis catheter MWF Awaiting transfer to [facility] - electrolytes balanced at present and pt not uremic or markedly overloaded so no emergent need for HD presently - Nephrology aware and following   Peripheral vascular disease status post BKA  Hx of DVT/PE On chronic Eliquis - covered w/ heparin gtt for now  Disposition:  awaiting transfer to [facility] to allow for HD as well as Stroke Team consultation    Objective: Blood pressure (!) 170/83, pulse 78, temperature 97.9 F (36.6 C), temperature source Oral, resp. rate 16, height '5\' 9"'$  (1.753 m), weight 103 kg, last menstrual period 09/10/2021, SpO2 98 %. No intake or output data in the 24 hours ending 10/22/22 0750 Filed Weights   10/21/22 1816  Weight: 103 kg    Examination: General: No acute respiratory distress Lungs: Clear to auscultation bilaterally without wheezes or crackles Cardiovascular: Regular rate and rhythm without murmur gallop or rub normal S1 and S2 -  R chest wall HD cath unremarkable in appearance  Abdomen: Nontender, nondistended, soft, bowel sounds positive, no rebound, no ascites, no appreciable mass Extremities: No significant cyanosis, clubbing, or edema bilateral lower extremities  CBC: Recent Labs  Lab 10/21/22 1828  WBC 6.5  HGB 11.6*  HCT 39.5  MCV  104.5*  PLT 169   Basic Metabolic Panel: Recent Labs  Lab 10/21/22 1928  NA 141  K 3.6  CL 102  CO2 29  GLUCOSE 221*  BUN 30*  CREATININE 9.37*  CALCIUM 9.7  MG 2.3   GFR: Estimated Creatinine Clearance: 9 mL/min (A) (by C-G formula based on SCr of 9.37 mg/dL (H)).   Scheduled Meds:   stroke: early stages of recovery book   Does not apply Once   acetaminophen  1,000 mg Oral Once   aspirin EC  81 mg Oral Daily   gabapentin  300 mg Oral Q M,W,F   insulin aspart  0-6 Units Subcutaneous Q4H   insulin glargine-yfgn  20 Units Subcutaneous QHS     LOS: 1 day   Cherene Altes, MD [facility] Office  [phone] Pager - Text Page per Shea Evans  If 7PM-7AM, please contact night-coverage per Amion 10/22/2022, 7:50 AM

## 2022-10-22 NOTE — Progress Notes (Signed)
Pharmacy Antibiotic Note  Heather [name] is a 51 y.o. female admitted on 10/21/2022 with acute CVA.  Pharmacy has been consulted to continue Vancomycin and Cefepime dosing in this patient who is on both antibiotics PTA for known endocarditis.  PTA on Vancomycin 1gm IV with HD on MWF (last dose 10/20/22) and Cefepime 2gm IV with HD on MWF (last dose 09/27/22)  Plan: No need for Vancomycin or Cefepime tonight Will follow up with HD plans and order Vancomycin and Cefepime once HD plans known.  Height: '5\' 9"'$  (175.3 cm) Weight: 103 kg (227 lb) IBW/kg (Calculated) : 66.2  Temp (24hrs), Avg:99 F (37.2 C), Min:98.9 F (37.2 C), Max:99.1 F (37.3 C)  Recent Labs  Lab 10/21/22 1828 10/21/22 1928  WBC 6.5  --   CREATININE  --  9.37*    Estimated Creatinine Clearance: 9 mL/min (A) (by C-G formula based on SCr of 9.37 mg/dL (H)).    Allergies  Allergen Reactions   Ibuprofen Other (See Comments)    CKD stage 3. Should avoid.   Dilaudid [Hydromorphone Hcl] Itching and Other (See Comments)    Can take with Benadryl.   Iodinated Contrast Media Rash   Tramadol Itching    Antimicrobials this admission:    Dose adjustments this admission:       Thank you for allowing pharmacy to be a part of this patient's care.  Everette Rank, PharmD 10/22/2022 12:25 AM

## 2022-10-22 NOTE — Consult Note (Signed)
[facility] Renal Consultation Note  Requesting MD: Lala Lund, MD Indication for Consultation:  ESRD  Chief complaint: right sided weakness  HPI:  Heather [name] is a 51 y.o. female with a history including ESRD, type 2 diabetes, hypertension, hx CVA, DVT/PE on anticoagulation, known mitral valve vegetation, and irritable bowel syndrome who presented to the hospital with sudden onset of right-sided upper and lower extremity weakness.  She had presented to the [place].  Neurology was consulted.  On imaging she was found to have embolic appearing infarcts.  Per charting she has been off of eliquis for two weeks.  She was transferred to [facility]  Nephrology is consulted for assistance with management of ESRD.  Her husband is at bedside.   They state that the weakness actually began after dialysis on Wednesday; she thought it would get better but it didn't.  Right leg is still weak.  Her right arm is weaker than baseline but getting a little better.    PMHx:   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[place]), nonhemorrhagic, inferior right cerebellum 12/03/2017   left side weakness in leg   Cyst of left ovary 01/12/2018   Diabetic neuropathy, painful ([place]), on low dose Gabapentin 07/13/2013   DM (diabetes mellitus), type 2, uncontrolled, with renal complications 53/61/4431   DM2 (diabetes mellitus, type 2) ([place])    Dysfunction of left eustachian tube, with pusatile tinnitus 11/24/2017   Dyslipidemia associated with type 2 diabetes mellitus ([place]) 06/25/2016   ESRD (end stage renal disease) on dialysis [facility])    On hemodialysis in July 2019 via [facility] then switched to CCPD in Nov 2019.    ESRD with anemia (HCC)    Fibromyalgia    Gastroparesis due to DM    GERD (gastroesophageal  reflux disease)    Headache    Hypertension associated with diabetes ([place]) 02/19/2011   IBS (irritable bowel syndrome)    Left-sided weakness 12/10/2017   .   Leiomyoma of uterus    Pancreatitis    PE (pulmonary thromboembolism) ([place]) 11/04/2019   Pneumonia    Proliferative diabetic retinopathy ([place]) 09/29/2015   Pronation deformity of both feet 06/14/2014   Reactive depression 12/10/2017   .   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[place]) 02/17/2018   Thromboembolism ([place]) 04/05/2018   Upper airway cough syndrome, with recs to stay off ACE and take Pepcid q hs 11/15/2016    Past Surgical History:  Procedure Laterality Date   A/V FISTULAGRAM Right 03/03/2020   Procedure: Venogram;  Surgeon: Waynetta Sandy, MD;  Location: [place] [facility];  Service: Cardiovascular;  Laterality: Right;   ARTERY REPAIR Right 02/17/2018   Procedure: EXPLORATION OF RIGHT BRACHIAL ARTERY;  Surgeon: Conrad Kenmar, MD;  Location: [place];  Service: Vascular;  Laterality: Right;   ARTERY REPAIR Right 02/17/2018   Procedure: BRACHIAL ARTERY EXPLORATION AND TRHOMBECTOMY;  Surgeon: Conrad [place], MD;  Location: [place];  Service: Vascular;  Laterality: Right;   AV FISTULA PLACEMENT Left 05/09/2017   Procedure: INSERTION OF ARTERIOVENOUS (AV) GRAFT ARM (ARTEGRAFT);  Surgeon: Conrad [place], MD;  Location: [facility] OR;  Service: Vascular;  Laterality: Left;   AV FISTULA PLACEMENT Right 02/17/2018  Procedure: INSERTION OF ARTERIOVENOUS (AV) GORE-TEX GRAFT ARM RIGHT UPPER ARM;  Surgeon: Conrad Millard, MD;  Location: [place];  Service: Vascular;  Laterality: Right;   AV FISTULA PLACEMENT Left 10/10/2019   Procedure: INSERTION OF LEFT ARTERIOVENOUS (AV) ARTEGRAFT GRAFT ARM;  Surgeon: Waynetta Sandy, MD;  Location: [place];  Service: Vascular;  Laterality: Left;   AV FISTULA PLACEMENT Right 03/19/2020   Procedure: INSERTION  OF ARTERIOVENOUS (AV) GORE-TEX GRAFT IN RIGHT ARM AND [place]AB[place]N STENT IN RIGHT AXILLARY ARTERY;  Surgeon: Waynetta Sandy, MD;  Location: [place];  Service: Vascular;  Laterality: Right;   [place] Left 08/31/2016   Procedure: BASILIC VEIN TRANSPOSITION FIRST STAGE;  Surgeon: Conrad Gandy, MD;  Location: [place];  Service: Vascular;  Laterality: Left;   BRONCHIAL WASHINGS  09/17/2021   Procedure: BRONCHIAL WASHINGS;  Surgeon: Collene Gobble, MD;  Location: [facility];  Service: Cardiopulmonary;;   CENTRAL VENOUS CATHETER INSERTION Left 07/24/2018   Procedure: INSERTION CENTRAL LINE ADULT;  Surgeon: Conrad Melwood, MD;  Location: [place];  Service: Vascular;  Laterality: Left;   EYE SURGERY     secondary to diabetic retinopathy    FOOT SURGERY Right    t"ook bone out- maybe hammer toe"   INSERTION OF DIALYSIS CATHETER Left 07/24/2018   Procedure: INSERTION OF TUNNELED DIALYSIS CATHETER;  Surgeon: Conrad [place], MD;  Location: [place];  Service: Vascular;  Laterality: Left;   IR FLUORO GUIDE CV LINE LEFT  12/19/2021   IR FLUORO GUIDE CV LINE LEFT  12/29/2021   IR FLUORO GUIDE CV LINE RIGHT  07/21/2018   IR FLUORO GUIDE CV LINE RIGHT  06/01/2019   IR FLUORO GUIDE CV LINE RIGHT  05/05/2020   IR FLUORO GUIDE CV LINE RIGHT  10/04/2022   IR REMOVAL TUN CV CATH W/O FL  01/23/2021   IR REMOVAL TUN CV CATH W/O FL  12/19/2021   IR REMOVAL TUN CV CATH W/O FL  10/01/2022   IR US GUIDE VASC ACCESS LEFT  12/19/2021   IR US GUIDE VASC ACCESS RIGHT  07/21/2018   IR US GUIDE VASC ACCESS RIGHT  06/01/2019   IR US GUIDE VASC ACCESS RIGHT  10/04/2022   LIGATION ARTERIOVENOUS GORTEX GRAFT Right 03/20/2020   Procedure: LIGATION ARM ARTERIOVENOUS GORTEX GRAFT With Patch Angioplasty, Thrombectomy;  Surgeon: Waynetta Sandy, MD;  Location: [place];  Service: Vascular;  Laterality: Right;   LIGATION OF ARTERIOVENOUS  FISTULA Left 05/09/2017   Procedure: LIGATION OF ARTERIOVENOUS  FISTULA;  Surgeon: Conrad Cerrill[place] Hoyos, MD;  Location: [place];  Service: Vascular;  Laterality: Left;   LOOP RECORDER INSERTION N/A 12/05/2017   Procedure: LOOP RECORDER INSERTION;  Surgeon: Evans Lance, MD;  Location: [place] [facility];  Service: Cardiovascular;  Laterality: N/A;   MYOMECTOMY     REMOVAL OF GRAFT Right 02/17/2018   Procedure: REMOVAL OF RIGHT UPPER ARM ARTERIOVENOUS GRAFT;  Surgeon: Conrad Clarke, MD;  Location: [place];  Service: Vascular;  Laterality: Right;   REVISION OF ARTERIOVENOUS GORETEX GRAFT Left 07/24/2018   Procedure: REDO ARTERIOVENOUS GORETEX GRAFT;  Surgeon: Conrad Montcalm, MD;  Location: [place];  Service: Vascular;  Laterality: Left;   TEE WITHOUT CARDIOVERSION N/A 12/05/2017   Procedure: TRANSESOPHAGEAL ECHOCARDIOGRAM (TEE);  Surgeon: Sanda Klein, MD;  Location: [place];  Service: Cardiovascular;  Laterality: N/A;   TEE WITHOUT CARDIOVERSION N/A 09/30/2022   Procedure: TRANSESOPHAGEAL ECHOCARDIOGRAM (TEE);  Surgeon: Jerline Pain, MD;  [facility];  Service: Cardiovascular;  Laterality: N/A;   UPPER EXTREMITY VENOGRAPHY Left 07/13/2018   Procedure: UPPER EXTREMITY VENOGRAPHY - Central & Left Arm;  Surgeon: Conrad Hollywood, MD;  Location: [place] [facility];  Service: Cardiovascular;  Laterality: Left;   UPPER EXTREMITY VENOGRAPHY Bilateral 09/10/2019   Procedure: UPPER EXTREMITY VENOGRAPHY;  Surgeon: Waynetta Sandy, MD;  Location: [place] [facility];  Service: Cardiovascular;  Laterality: Bilateral;   UTERINE FIBROID SURGERY     VIDEO BRONCHOSCOPY N/A 09/17/2021   Procedure: VIDEO BRONCHOSCOPY WITHOUT FLUORO;  Surgeon: Collene Gobble, MD;  [facility];  Service: Cardiopulmonary;  Laterality: N/A;   VITRECTOMY Bilateral     Family Hx:  Family History  Problem Relation Age of Onset   Colon polyps Mother    Diabetes Mother    Heart murmur Father        history essentially unknown   Hypertension Father    Diabetes Sister    Kidney failure Sister         kidney transplant   Diabetes Brother    Retinal degeneration Brother    Heart murmur Son    Stroke Paternal Grandmother    Diabetes Sister     Social History:  reports that she has never smoked. She has never used smokeless tobacco. She reports that she does not drink alcohol and does not use drugs.  Allergies:  Allergies  Allergen Reactions   Ibuprofen Other (See Comments)    CKD stage 3. Should avoid.   Dilaudid [Hydromorphone Hcl] Itching and Other (See Comments)    Can take with Benadryl.   Iodinated Contrast Media Rash   Tramadol Itching    Medications: Prior to Admission medications   Medication Sig Start Date End Date Taking? Authorizing Provider  amLODipine-olmesartan (AZOR) 5-20 MG tablet TAKE 1 TABLET BY MOUTH EVERY DAY 09/09/22  Yes [name], MD  calcium acetate (PHOSLO) 667 MG capsule Take 2,001 mg by mouth 3 (three) times daily with meals. 08/25/21  Yes [provider]  ceFEPIme 2 g in sodium chloride 0.9 % 100 mL Inject 2 g into the vein every Monday, Wednesday, and Friday at 8 PM. 10/06/22  Yes Ghimire, Henreitta Leber, MD  clindamycin (CLEOCIN T) 1 % SWAB Apply 1 Application topically 3 (three) times a week. 09/22/22  Yes [provider]  fluticasone (FLONASE) 50 MCG/ACT nasal spray Place 2 sprays into both nostrils daily. Patient taking differently: Place 2 sprays into both nostrils daily as needed for allergies. 07/02/22  Yes Cobb, Karie Schwalbe, NP  gabapentin (NEURONTIN) 300 MG capsule Take 1 capsule (300 mg total) by mouth every Monday, Wednesday, and Friday. At 10:30 am 10/13/22  Yes [name], MD  insulin aspart (NOVOLOG FLEXPEN) 100 UNIT/ML FlexPen Inject 6 Units into the skin 3 (three) times daily with meals. Sliding scale 02/25/22  Yes [name], MD  insulin degludec (TRESIBA FLEXTOUCH) 100 UNIT/ML FlexTouch Pen Inject 20 Units into the skin at bedtime. Patient taking differently: Inject 26 Units into the skin at bedtime. 10/05/22   Yes Ghimire, Henreitta Leber, MD  ketoconazole (NIZORAL) 2 % cream Apply 1 Application topically 2 (two) times a week. 09/22/22  Yes [provider]  loratadine (CLARITIN) 10 MG tablet Take 10 mg by mouth daily. 09/30/22  Yes [provider]  Olopatadine HCl (PATADAY) 0.2 % SOLN Apply 1 drop to eye daily. Patient taking differently: Apply 1 drop to eye daily as needed (dry eyes).  10/12/22  Yes [name], MD  vancomycin [facility]) 1-5 GM/200ML-% SOLN Inject 200 mLs (1,000 mg total) into the vein every Monday, Wednesday, and Friday with hemodialysis. 10/06/22  Yes Ghimire, Henreitta Leber, MD  azelastine (ASTELIN) 0.1 % nasal spray Place 2 sprays into both nostrils 2 (two) times daily. 10/12/22   [name], MD  Continuous Blood Gluc Sensor (FREESTYLE LIBRE 14 DAY SENSOR) MISC 1 patch by Does not apply route every 14 (fourteen) days. 02/25/22   [name], MD  DIFLUCAN 100 MG tablet Take by mouth. 10/20/22   [provider]  Doxercalciferol (HECTOROL IV) Doxercalciferol (Hectorol) 05/07/22 05/06/23  [provider]  Insulin Pen Needle 29G X 5MM MISC 1 Device by Does not apply route 4 (four) times daily. 12/26/19   Shamleffer, Melanie Crazier, MD  Methoxy PEG-Epoetin Beta (MIRCERA IJ) Mircera 03/03/22 03/02/23  [provider]    I have reviewed the patient's current and prior to admission medications.  Labs:     Latest Ref Rng & Units 10/21/2022    7:28 PM 10/05/2022    4:23 AM 10/02/2022    3:27 AM  BMP  Glucose 70 - 99 mg/dL 221  182  93   BUN 6 - 20 mg/dL '30  19  19   '$ Creatinine 0.44 - 1.00 mg/dL 9.37  6.02  6.61   Sodium 135 - 145 mmol/L 141  132  135   Potassium 3.5 - 5.1 mmol/L 3.6  3.5  3.4   Chloride 98 - 111 mmol/L 102  96  96   CO2 22 - 32 mmol/L '29  24  26   '$ Calcium 8.9 - 10.3 mg/dL 9.7  8.6  9.4      ROS:  Pertinent items noted in HPI and remainder of comprehensive ROS otherwise negative.  Physical Exam: Vitals:   10/22/22 1600  10/22/22 1653  BP: (!) 183/88 (!) 187/88  Pulse: 80 79  Resp: 15 16  Temp: (!) 97.4 F (36.3 C) 97.6 F (36.4 C)  SpO2: 92% 98%     General: adult female in bed in NAD at rest   HEENT: NCAT Eyes: EOMI sclera anicteric Neck: supple trachea midline  Heart: S1S2 no rub Lungs: clear and unlabored on room air Abdomen: Soft/nt/nd; obese habitus  Extremities: no edema LLE or residual right limb Skin: no rash on extremities exposed Neuro: speech fluent, alert and oriented x 3 provides hx; gross right lower extremity weakness; she is able to move her right arm but weaker than left Access RIJ Tunn catheter   Outpatient HD orders:  [place] 3 hours and 45 minutes  MWF BF 400 / DF 700  EDW 101.7 kg (reached 102.2 kg on 10/25 - but can meet EDW) 2K/2Ca Tunn catheter  Meds:  Getting cefepime 2 gram and vanc 1 gram with HD until 11/12/22 Hectorol 3 mcg every tx Mircera 200 mcg every 2 weeks (last given 10/15/22)  Assessment/Plan:  Acute embolic stroke - per neurology and primary team  - defer HD today and plan to reassess for likely HD tomorrow - Will avoid dropping BP below 408-144 systolic on HD  Endocarditis  - on vanc 1 gra and cefepime 2 gram with each HD as an outpatient (through 11/12/22)  HTN  - regimen per primary team and neurology in the setting of acute stroke   ESRD - Usually MWF - Will hold HD today and reassess for HD on 10/28 in the setting of acute stroke  -  await updated labs and obtain renal panel in AM   DVT/PE - on anticoagulation - per primary team   Anemia CKD - defer ESA in setting of acute CVA  Metabolic bone disease - continue hectorol as above   Disposition - continue inpatient monitoring   Claudia Desanctis 10/22/2022, 6:47 PM

## 2022-10-22 NOTE — Progress Notes (Addendum)
ANTICOAGULATION CONSULT NOTE - Initial Consult  Pharmacy Consult for IV heparin Indication: stroke  Allergies  Allergen Reactions   Ibuprofen Other (See Comments)    CKD stage 3. Should avoid.   Dilaudid [Hydromorphone Hcl] Itching and Other (See Comments)    Can take with Benadryl.   Iodinated Contrast Media Rash   Tramadol Itching    Patient Measurements: Height: '5\' 9"'$  (175.3 cm) Weight: 103 kg (227 lb) IBW/kg (Calculated) : 66.2 Heparin Dosing Weight: 89 kg  Vital Signs: Temp: 97.9 F (36.6 C) (10/27 0730) Temp Source: Oral (10/27 0730) BP: 170/83 (10/27 0730) Pulse Rate: 78 (10/27 0730)  Labs: Recent Labs    10/21/22 1828 10/21/22 1928  HGB 11.6*  --   HCT 39.5  --   PLT 153  --   CREATININE  --  9.37*    Estimated Creatinine Clearance: 9 mL/min (A) (by C-G formula based on SCr of 9.37 mg/dL (H)).   Medical History: 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[place]), nonhemorrhagic, inferior right cerebellum 12/03/2017   left side weakness in leg   Cyst of left ovary 01/12/2018   Diabetic neuropathy, painful ([place]), on low dose Gabapentin 07/13/2013   DM (diabetes mellitus), type 2, uncontrolled, with renal complications 33/29/5188   DM2 (diabetes mellitus, type 2) ([place])    Dysfunction of left eustachian tube, with pusatile tinnitus 11/24/2017   Dyslipidemia associated with type 2 diabetes mellitus ([place]) 06/25/2016   ESRD (end stage renal disease) on dialysis [facility])    On hemodialysis in July 2019 via [facility] then switched to CCPD in Nov 2019.    ESRD with anemia (HCC)    Fibromyalgia    Gastroparesis due to DM    GERD (gastroesophageal reflux disease)    Headache    Hypertension associated with diabetes ([place]) 02/19/2011   IBS (irritable bowel syndrome)    Left-sided  weakness 12/10/2017   .   Leiomyoma of uterus    Pancreatitis    PE (pulmonary thromboembolism) ([place]) 11/04/2019   Pneumonia    Proliferative diabetic retinopathy ([place]) 09/29/2015   Pronation deformity of both feet 06/14/2014   Reactive depression 12/10/2017   .   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[place]) 02/17/2018   Thromboembolism ([place]) 04/05/2018   Upper airway cough syndrome, with recs to stay off ACE and take Pepcid q hs 11/15/2016    Medications:  (Not in a hospital admission)  Scheduled:    stroke: early stages of recovery book   Does not apply Once   calcium acetate  2,001 mg Oral TID WC   gabapentin  300 mg Oral Q M,W,F   insulin aspart  0-6 Units Subcutaneous TID WC   insulin glargine-yfgn  20 Units Subcutaneous QHS   PRN: acetaminophen, butalbital-acetaminophen-caffeine  Assessment: 30 yoF with PMH ESRD on HD MWF, on chronic Eliquis for VTE, Hx CVA 2018, DM2, 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 Found to have infarcts in the L paramedian frontoparietal region along with additional acute infarcts in left frontal cortex and R occipital cortex. Infarcts felt to be a result of septic emboli, as well as not taking Eliquis since 10/10 due to a  miscommunication following an invasive procedure.  10:30 AM 10/22/2022: Initial Neurologist note recommended ASA only and to avoid full anticoagulation d/t high risk of hemorrhage associated with CVA from endocarditis. Subsequently received Pharmacy consult from hospitalist instructing to start conservatively-dosed heparin under direction of Stroke Team. At the time of this writing, the only chart documentation to support starting heparin is the Pharmacy consult itself, entered by [facility]. Have also verbally confirmed with  Hospitalist that Neurology recommendations have changed to include full-dose anticoagulation, but have not received actual confirmation from Neurology at this time (either directly or via updated chart documentation).  Baseline INR, aPTT: not done Prior anticoagulation: Eliquis 5 mg BID; last dose ~10/05/22 per patient  Significant events:  Today, 10/22/2022: CBC: Hgb/Plt both borderline low CrCl = ESRD on HD No bleeding or infusion issues per nursing  Goal of Therapy: Heparin level 0.3-0.5 units/ml Monitor platelets by anticoagulation protocol: Yes  Plan: Heparin 1200 units/hr IV infusion, no bolus Check heparin level 8 hrs after start Daily CBC, daily heparin level once stable Monitor for signs of bleeding or thrombosis  Reuel Boom, PharmD, BCPS [phone] 10/22/2022, 10:19 AM

## 2022-10-22 NOTE — Progress Notes (Signed)
Carotid duplex bilateral and lower extremity venous bilateral study completed.   Please see CV Proc for preliminary results.   Darlin Coco, RDMS, RVT

## 2022-10-22 NOTE — Progress Notes (Signed)
PT Cancellation Note  Patient Details Name: Heather [name] MRN: [id number] DOB: January 01, 1971   Cancelled Treatment:    Reason Eval/Treat Not Completed: Other (comment) Pt with U/S of bil LE ordered to evaluate for DVT.  Will hold PT for results. Abran Richard, PT Acute Rehab [facility] [phone]   Karlton Lemon 10/22/2022, 9:53 AM

## 2022-10-22 NOTE — ED Notes (Signed)
[facility] called for transport to [facility].  

## 2022-10-22 NOTE — Progress Notes (Signed)
OT Cancellation Note  Patient Details Name: Heather [name] MRN: [id number] DOB: March 24, 1971   Cancelled Treatment:    Reason Eval/Treat Not Completed: Other (comment). Attempted to see patient this morning and this afternoon patient being transferred to [facility].  Cisco Kindt L Kioni Stahl 10/22/2022, 3:31 PM

## 2022-10-22 NOTE — Progress Notes (Signed)
PT Cancellation Note  Patient Details Name: [name] MRN: [id number] DOB: 1971-07-20   Cancelled Treatment:    Reason Eval/Treat Not Completed: Other (comment) Noted U/S negative for DVT, but [facility] called for transport to [facility].  Will continue to hold PT eval as pt is transferring. Abran Richard, PT Acute Rehab [facility] [phone]   Karlton Lemon 10/22/2022, 3:30 PM

## 2022-10-23 DIAGNOSIS — N186 End stage renal disease: Secondary | ICD-10-CM | POA: Diagnosis not present

## 2022-10-23 DIAGNOSIS — E119 Type 2 diabetes mellitus without complications: Secondary | ICD-10-CM | POA: Diagnosis not present

## 2022-10-23 DIAGNOSIS — I330 Acute and subacute infective endocarditis: Secondary | ICD-10-CM | POA: Diagnosis not present

## 2022-10-23 DIAGNOSIS — I639 Cerebral infarction, unspecified: Secondary | ICD-10-CM | POA: Diagnosis not present

## 2022-10-23 LAB — RENAL FUNCTION PANEL
Albumin: 2.8 g/dL — ABNORMAL LOW (ref 3.5–5.0)
Anion gap: 15 (ref 5–15)
BUN: 35 mg/dL — ABNORMAL HIGH (ref 6–20)
CO2: 27 mmol/L (ref 22–32)
Calcium: 10.3 mg/dL (ref 8.9–10.3)
Chloride: 100 mmol/L (ref 98–111)
Creatinine, Ser: 12.04 mg/dL — ABNORMAL HIGH (ref 0.44–1.00)
GFR, Estimated: 3 mL/min — ABNORMAL LOW (ref >60)
Glucose, Bld: 180 mg/dL — ABNORMAL HIGH (ref 70–99)
Phosphorus: 5.3 mg/dL — ABNORMAL HIGH (ref 2.5–4.6)
Potassium: 3.6 mmol/L (ref 3.5–5.1)
Sodium: 142 mmol/L (ref 135–145)

## 2022-10-23 LAB — CBC
HCT: 33.2 % — ABNORMAL LOW (ref 36.0–46.0)
Hemoglobin: 10.3 g/dL — ABNORMAL LOW (ref 12.0–15.0)
MCH: 31.1 pg (ref 26.0–34.0)
MCHC: 31 g/dL (ref 30.0–36.0)
MCV: 100.3 fL — ABNORMAL HIGH (ref 80.0–100.0)
Platelets: 141 10*3/uL — ABNORMAL LOW (ref 150–400)
RBC: 3.31 MIL/uL — ABNORMAL LOW (ref 3.87–5.11)
RDW: 16.8 % — ABNORMAL HIGH (ref 11.5–15.5)
WBC: 5.9 10*3/uL (ref 4.0–10.5)
nRBC: 1.4 % — ABNORMAL HIGH (ref 0.0–0.2)

## 2022-10-23 LAB — HEMOGLOBIN A1C
Hgb A1c MFr Bld: 7.4 % — ABNORMAL HIGH (ref 4.8–5.6)
Mean Plasma Glucose: 165.68 mg/dL

## 2022-10-23 LAB — GLUCOSE, CAPILLARY
Glucose-Capillary: 146 mg/dL — ABNORMAL HIGH (ref 70–99)
Glucose-Capillary: 163 mg/dL — ABNORMAL HIGH (ref 70–99)
Glucose-Capillary: 193 mg/dL — ABNORMAL HIGH (ref 70–99)
Glucose-Capillary: 160 mg/dL — ABNORMAL HIGH (ref 70–99)

## 2022-10-23 MED ORDER — SODIUM CHLORIDE 0.9 % IV SOLN: 2.0000 g | INTRAVENOUS | Status: DC

## 2022-10-23 MED ORDER — VANCOMYCIN HCL IN DEXTROSE 1-5 GM/200ML-% IV SOLN: 1000.0000 mg | INTRAVENOUS | Status: DC

## 2022-10-23 MED ORDER — DIPHENHYDRAMINE HCL 25 MG PO CAPS
ORAL_CAPSULE | ORAL | Status: AC
  Administered 2022-10-23: 25 mg via ORAL
  Filled 2022-10-23: qty 1

## 2022-10-23 MED ORDER — SODIUM CHLORIDE 0.9 % IV SOLN: 2.0000 g | Freq: Once | INTRAVENOUS | Status: DC

## 2022-10-23 MED ORDER — HEPARIN SODIUM (PORCINE) 1000 UNIT/ML IJ SOLN
INTRAMUSCULAR | Status: AC
  Filled 2022-10-23: qty 4

## 2022-10-23 MED ORDER — ASPIRIN 81 MG PO CHEW
81.0000 mg | CHEWABLE_TABLET | Freq: Every day | ORAL | Status: DC
  Administered 2022-10-23 – 2022-10-24 (×2): 81 mg via ORAL
  Filled 2022-10-23 (×2): qty 1

## 2022-10-23 MED ORDER — VANCOMYCIN HCL IN DEXTROSE 1-5 GM/200ML-% IV SOLN
1000.0000 mg | Freq: Once | INTRAVENOUS | Status: AC
  Administered 2022-10-23: 1000 mg via INTRAVENOUS
  Filled 2022-10-23 (×3): qty 200

## 2022-10-23 NOTE — Evaluation (Signed)
Occupational Therapy Evaluation Patient Details Name: Heather [name] MRN: [id number] DOB: 12-24-1971 Today's Date: 10/23/2022   History of Present Illness 51 y.o. female presents to [facility] ED 10/22/22  with onset of RUE and RLE weakness and incoordination after receiving flu shot 10/25. MRI scan of the brain shows small acute infarcts in the left paramedian frontoparietal region and punctate infarcts in left frontal cortex and right occipital cortex. Transferred to [facility]. 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   Clinical Impression   PTA, pt lived with her husband and could perform ADL with mod I, but husband frequently assisted. Upon eval, pt presents with tingling sensation in R hand, decreased RUE strength, and decreased balance. Pt performing UB ADL with set-up and LB ADL with min guard A. Pt additionally with slow cognitive processing and reporting this is a large concern of hers for several months. Pt also with decreased knowledge regarding transfers and residual limb care. Recommending OP OT to optimize safety with transfers, ADL, and IADL.    Recommendations for follow up therapy are one component of a multi-disciplinary discharge planning process, led by the attending physician.  Recommendations may be updated based on patient status, additional functional criteria and insurance authorization.   Follow Up Recommendations  Outpatient OT    Assistance Recommended at Discharge Intermittent Supervision/Assistance  Patient can return home with the following A little help with walking and/or transfers;A little help with bathing/dressing/bathroom;Assistance with cooking/housework;Assist for transportation;Help with stairs or ramp for entrance    Functional Status Assessment  Patient has had a recent decline in  their functional status and demonstrates the ability to make significant improvements in function in a reasonable and predictable amount of time.  Equipment Recommendations  None recommended by OT (Pt has recommended equipment)    Recommendations for Other Services Speech consult (Pt would like a speech consult)     Precautions / Restrictions Precautions Precautions: Fall Restrictions Weight Bearing Restrictions: No RLE Weight Bearing:  (R BKA)      Mobility Bed Mobility Overal bed mobility: Modified Independent             General bed mobility comments: use of bed rail and incresed effort to come to sit EoB. Was observed to pull on residual limb for leverage. Providing education for alternative methods.    Transfers Overall transfer level: Needs assistance Equipment used: None Transfers: Bed to chair/wheelchair/BSC            Lateral/Scoot Transfers: Min guard General transfer comment: min guard for safety. Pt preference to transfer to wheelchair on R side, but PT reporting to OT that pt with reliance on resilual limb for leverage and with calluses. Transferring toward L with OT and requiring increased time and effort. Pt will benefit from continued education regarding transfers.      Balance Overall balance assessment: Needs assistance Sitting-balance support: Feet supported, No upper extremity supported, Bilateral upper extremity supported, Single extremity supported Sitting balance-Leahy Scale: Good                                     ADL either performed or assessed with clinical judgement   ADL Overall ADL's : Needs assistance/impaired Eating/Feeding: Modified independent;Sitting   Grooming: Modified independent;Sitting   Upper Body  Bathing: Set up;Sitting   Lower Body Bathing: Set up;Sitting/lateral leans   Upper Body Dressing : Set up;Sitting   Lower Body Dressing: Min guard;Sitting/lateral leans Lower Body Dressing Details (indicate  cue type and reason): donning sock near EOB. Pt reports husband frequently helps and she would like to be more indpendent at home. Toilet Transfer: Min guard;Squat-pivot (more of a lateral scoot) Toilet Transfer Details (indicate cue type and reason): lateral scoot simulated to w/c Toileting- Clothing Manipulation and Hygiene: Supervision/safety;Sitting/lateral lean   Tub/ Shower Transfer: Min guard;Tub bench (lateral scoot)   Functional mobility during ADLs: Min guard;Wheelchair General ADL Comments: Pt limited by decreased strength, knowledge regarding safe transfers     Vision Baseline Vision/History: 0 No visual deficits Ability to See in Adequate Light: 0 Adequate Patient Visual Report: No change from baseline Vision Assessment?: No apparent visual deficits     Perception     Praxis      Pertinent Vitals/Pain Pain Assessment Pain Assessment: No/denies pain     Hand Dominance Right   Extremity/Trunk Assessment Upper Extremity Assessment Upper Extremity Assessment: RUE deficits/detail RUE Deficits / Details: grossly 4/5 strength. Reporting a numbness in her hand, but that it feels the same when being touched. Greater tingling sensations with functional use only in hand. RUE Sensation: decreased light touch RUE Coordination: decreased fine motor;decreased gross motor   Lower Extremity Assessment Lower Extremity Assessment: Defer to PT evaluation       Communication Communication Communication: No difficulties   Cognition Arousal/Alertness: Awake/alert Behavior During Therapy: WFL for tasks assessed/performed Overall Cognitive Status: Impaired/Different from baseline Area of Impairment: Memory, Problem solving                     Memory: Decreased short-term memory       Problem Solving: Slow processing General Comments: Pt with some slow processing, but when asked if her thinking seemed any different, she reports difficulty word finding and requring  increased time to perform desired actions since having COVID. Pt reports she would like to address this as much as possible while she is here.     General Comments  Pt has calluses on knee cap and at end of residual limb secodary to increased R residual limb and knee use to pull herself to wheelchair on R sided    Exercises     Shoulder Instructions      Home Living Family/patient expects to be discharged to:: Private residence Living Arrangements: Spouse/significant other Available Help at Discharge: Family Type of Home: House Home Access: Stairs to enter Technical brewer of Steps: 3 Entrance Stairs-Rails: Right;Left Home Layout: One level     Bathroom Shower/Tub: Teacher, early years/pre: Standard Bathroom Accessibility: Yes   Home Equipment: IT sales professional (4 wheels);Tub bench;Hand held shower head          Prior Functioning/Environment Prior Level of Function : Independent/Modified Independent             Mobility Comments: independent with transfers car, toilet, wheelchair, husband and son provide transportation to HD ADLs Comments: independent in bathing/dressing, cooking, cleaning        OT Problem List: Decreased strength;Decreased activity tolerance;Impaired balance (sitting and/or standing);Decreased cognition;Decreased coordination;Decreased knowledge of use of DME or AE;Impaired sensation      OT Treatment/Interventions: Self-care/ADL training;Therapeutic exercise;Balance training;Patient/family education;Cognitive remediation/compensation;Therapeutic activities;DME and/or AE instruction    OT Goals(Current goals can be found in the care plan section) Acute Rehab OT Goals Patient Stated Goal:  for LLE to be stronger and to perform safer transfers OT Goal Formulation: With patient Time For Goal Achievement: 11/06/22 Potential to Achieve Goals: Good  OT Frequency: Min 2X/week    Co-evaluation              AM-PAC OT  "6 Clicks" Daily Activity     Outcome Measure Help from another person eating meals?: None Help from another person taking care of personal grooming?: A Little Help from another person toileting, which includes using toliet, bedpan, or urinal?: A Little Help from another person bathing (including washing, rinsing, drying)?: A Little Help from another person to put on and taking off regular upper body clothing?: A Little Help from another person to put on and taking off regular lower body clothing?: A Little 6 Click Score: 19   End of Session Equipment Utilized During Treatment:  (wheelchair) Nurse Communication: Mobility status  Activity Tolerance: Patient tolerated treatment well Patient left: in bed;with call bell/phone within reach;with bed alarm set;with family/visitor present  OT Visit Diagnosis: Unsteadiness on feet (R26.81);Other abnormalities of gait and mobility (R26.89);Muscle weakness (generalized) (M62.81);Other symptoms and signs involving cognitive function                Time: 4196-2229 OT Time Calculation (min): 41 min Charges:  OT General Charges $OT Visit: 1 Visit OT Evaluation $OT Eval Moderate Complexity: 1 Mod OT Treatments $Self Care/Home Management : 23-37 mins  Shanda Howells, OTR/L [facility] Acute Rehabilitation Office: [phone]   Heather [name] 10/23/2022, 1:08 PM

## 2022-10-23 NOTE — Progress Notes (Signed)
PROGRESS NOTE                                                                                                                                                                                                             Patient Demographics:    Heather [name], is a 51 y.o. 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 Of note the patient had not been taking her Eliquis since 10/05/22.   Subjective:    Heather [name] today has, No headache, No chest pain, No abdominal pain - No Nausea, no cough or shortness of breath, right-sided weakness and numbness gradually improving.   Assessment  & Plan :   Acute infarct in the left paramedian frontoparietal region, with punctate additional acute infarcts in the left frontal cortex and right occipital cortex. Given multiple vascular territories, an embolic etiology is suspected.  He does have right-sided weakness and numbness which is improving, seen by stroke team Dr. Leonie Man, discussed with stroke team in detail on 10/22/2022, per Dr. Leonie Man her stroke likely is from the embolization from her endocarditis, no role for full anticoagulation, per Dr. Leonie Man full anticoagulation could cause increase chances of hemorrhagic conversion in the setting of  endocarditis related embolic stroke.  [name] undergoing full stroke protocol, A1c is borderline at 7.6, carotid duplex stable, echocardiogram and LDL are pending, continue full stroke work-up.  Placed on aspirin 81 mg per neuro team.  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   HTN - Permissive hypertension for now   Anemia of chronic kidney disease - Hemoglobin stable presently   ESRD on HD via dialysis catheter MWF - renal on board defer HD to renal team.   Peripheral vascular disease status post R. BKA - supportive Rx.  History of DVT PE.  Per patient and husband over 5 to 6 years ago, recent lower extremity venous duplex unremarkable, hold further anticoagulation see discussion in #1 above.   DM type II.  Stable A1c, continue long-acting insulin and sliding scale monitor.   CBG (last 3)  Recent Labs    10/22/22 1656 10/22/22 2152 10/23/22 0806  GLUCAP 202* 157* 146*        Condition - Extremely Guarded  Family Communication  : Husband Charles on 10/23/2022, does not recall DVT or PE in the last 5 to 6 years, does say that she was not taking her Eliquis for the last several weeks since she was discharged from the hospital.  Understands the rationale of holding Eliquis going forward.  Code Status : Full code  Consults  : Nephrology, stroke team  PUD Prophylaxis : None   Procedures  :     Leg [name]  - No DVT  Carotid US - Right Carotid: Velocities in the right ICA are consistent with a 1-39% stenosis. Left Carotid: Velocities in the left  ICA are consistent with a 1-39% stenosis. Vertebrals:  Bilateral vertebral arteries demonstrate antegrade flow. Subclavians: Normal flow hemodynamics were seen in bilateral subclavian  arteries.  MRI brain and MRI C-spine - Acute infarct in the left paramedian frontoparietal region, with punctate additional acute infarcts in the left frontal cortex and right occipital cortex. Given multiple vascular territories, an embolic etiology is suspected.  C-spine nonacute with some nonspecific changes.  MRA - 1 . Negative for vessel occlusion. 2. Positive for progressed intracranial atherosclerosis since 2019 with new moderate to severe Left M2 and moderate Right M2 origin stenoses from prior MRA. Up to moderate bilateral supraclinoid ICA stenosis also appears increased on the Right.       Disposition Plan  :    Status is: Inpatient  DVT Prophylaxis  :    heparin injection 5,000 Units Start: 10/22/22 2200 SCD's Start: 10/22/22 0320   Lab Results  Component Value Date   PLT 141 (L) 10/23/2022    Diet :  Diet Order             Diet Carb Modified Fluid consistency: Thin; Room service appropriate? Yes  Diet effective now                    Inpatient Medications  Scheduled Meds:   stroke: early stages of recovery book   Does not apply Once   calcium acetate  2,001 mg Oral TID WC   Chlorhexidine Gluconate Cloth  6 each Topical Daily   Chlorhexidine Gluconate Cloth  6 each Topical Q0600   [START ON 10/25/2022] doxercalciferol  3 mcg Oral Q M,W,F-HD   gabapentin  300 mg Oral Q M,W,F   heparin injection (subcutaneous)  5,000 Units Subcutaneous Q8H   insulin aspart  0-6 Units Subcutaneous TID WC   insulin glargine-yfgn  20 Units Subcutaneous QHS  Continuous Infusions:  [START ON 10/25/2022] ceFEPime (MAXIPIME) IV     ceFEPime (MAXIPIME) IV     [START ON 10/25/2022] vancomycin     vancomycin     PRN Meds:.acetaminophen, butalbital-acetaminophen-caffeine  Antibiotics  :     Anti-infectives (From admission, onward)    Start     Dose/Rate Route Frequency Ordered Stop   10/25/22 1200  vancomycin (VANCOCIN) IVPB 1000 mg/200 mL premix        1,000 mg 200 mL/hr over 60 Minutes Intravenous Every M-W-F (Hemodialysis) 10/23/22 0902     10/25/22 1200  ceFEPIme (MAXIPIME) 2 g in sodium chloride 0.9 % 100 mL IVPB        2 g 200 mL/hr over 30 Minutes Intravenous Every M-W-F (Hemodialysis) 10/23/22 0902     10/23/22 1800  ceFEPIme (MAXIPIME) 2 g in sodium chloride 0.9 % 100 mL IVPB        2 g 200 mL/hr over 30 Minutes Intravenous  Once 10/23/22 0902     10/23/22 1800  vancomycin (VANCOCIN) IVPB 1000 mg/200 mL premix        1,000 mg 200 mL/hr over 60 Minutes Intravenous  Once 10/23/22 0902           Objective:   Vitals:   10/22/22 1928 10/23/22 0000 10/23/22 0409 10/23/22 0812  BP: (!) 196/86 (!) 165/69 (!) 192/92 (!) 174/84  Pulse: 79 78 79 79  Resp: '16 16 16 18  '$ Temp: 98.1 F (36.7 C) 98 F (36.7 C) 97.9 F (36.6 C) 97.8 F (36.6 C)  TempSrc: Oral Oral Oral Oral  SpO2: 91% (!) 89% 98% 97%  Weight:      Height:        Wt Readings from Last 3 Encounters:  10/21/22 103 kg  10/12/22 103.1 kg  10/05/22 101.7 kg    No intake or output data in the 24 hours ending 10/23/22 0946   Physical Exam  Awake Alert, No new F.N deficits, mild R sided weakness, Normal affect [place].AT,PERRAL Supple Neck, No JVD,   Symmetrical Chest wall movement, Good air movement bilaterally, CTAB RRR,No Gallops,Rubs or new Murmurs,  +ve B.Sounds, Abd Soft, No tenderness,   R.BKA      Data Review:    CBC Recent Labs  Lab 10/21/22 1828 10/23/22 0248  WBC 6.5 5.9  HGB 11.6* 10.3*  HCT 39.5 33.2*  PLT 153 141*  MCV 104.5* 100.3*  MCH 30.7 31.1  MCHC 29.4* 31.0  RDW 17.0* 16.8*    Electrolytes Recent Labs  Lab 10/21/22 1928 10/22/22 1922 10/23/22 0248  NA 141 141 142  K 3.6 3.6 3.6  CL 102 101 100  CO2 '29 27 27  '$ GLUCOSE 221* 182* 180*  BUN 30* 33* 35*   CREATININE 9.37* 11.19* 12.04*  CALCIUM 9.7 10.1 10.3  AST 18  --   --   ALT 6  --   --   ALKPHOS 90  --   --   BILITOT 0.5  --   --   ALBUMIN 3.2* 3.0* 2.8*  MG 2.3  --   --     ------------------------------------------------------------------------------------------------------------------ No results for input(s): "CHOL", "HDL", "LDLCALC", "TRIG", "CHOLHDL", "LDLDIRECT" in the last 72 hours.  Lab Results  Component Value Date   HGBA1C 7.6 (H) 09/30/2022      Radiology Reports  VAS US CAROTID  Result Date: 10/22/2022 Carotid Arterial Duplex Study Patient Name:  STACYANN MCCONAUGHY  Date of Exam:   10/22/2022 Medical Rec #: [id number]  Accession #:    [id number] Date of Birth: 1971/05/03            Patient Gender: F Patient Age:   32 years Exam Location:  [facility] Procedure:      VAS US CAROTID Referring Phys: Nyoka Lint DOUTOVA --------------------------------------------------------------------------------  Indications:       CVA. Risk Factors:      Hypertension, hyperlipidemia, Diabetes, prior CVA, PAD. Other Factors:     History of PE. Comparison Study:  No recent prior studies. Performing Technologist: Darlin Coco RDMS, RVT  Examination Guidelines: A complete evaluation includes B-mode imaging, spectral Doppler, color Doppler, and power Doppler as needed of all accessible portions of each vessel. Bilateral testing is considered an integral part of a complete examination. Limited examinations for reoccurring indications may be performed as noted.  Right Carotid Findings: +----------+--------+--------+--------+-----------------------+--------+           PSV cm/sEDV cm/sStenosisPlaque Description     Comments +----------+--------+--------+--------+-----------------------+--------+ CCA Prox  96      14                                              +----------+--------+--------+--------+-----------------------+--------+ CCA Distal77      16                                               +----------+--------+--------+--------+-----------------------+--------+ ICA Prox  75      24      1-39%   diffuse and hyperechoic         +----------+--------+--------+--------+-----------------------+--------+ ICA Mid   77      21                                              +----------+--------+--------+--------+-----------------------+--------+ ICA Distal74      18                                              +----------+--------+--------+--------+-----------------------+--------+ ECA       83      10                                              +----------+--------+--------+--------+-----------------------+--------+ +----------+--------+-------+----------------+-------------------+           PSV cm/sEDV cmsDescribe        Arm Pressure (mmHG) +----------+--------+-------+----------------+-------------------+ [id number]            Multiphasic, WNL                    +----------+--------+-------+----------------+-------------------+ +---------+--------+--+--------+--+---------+ VertebralPSV cm/s71EDV cm/s14Antegrade +---------+--------+--+--------+--+---------+  Left Carotid Findings: +----------+--------+--------+--------+-----------------------+--------+           PSV cm/sEDV cm/sStenosisPlaque Description     Comments +----------+--------+--------+--------+-----------------------+--------+ CCA Prox  120     20                                              +----------+--------+--------+--------+-----------------------+--------+  CCA Distal77      17                                              +----------+--------+--------+--------+-----------------------+--------+ ICA Prox  40      11      1-39%   diffuse and hyperechoic         +----------+--------+--------+--------+-----------------------+--------+ ICA Mid   70      26                                               +----------+--------+--------+--------+-----------------------+--------+ ICA Distal71      22                                              +----------+--------+--------+--------+-----------------------+--------+ ECA       76      10                                              +----------+--------+--------+--------+-----------------------+--------+ +----------+--------+--------+----------------+-------------------+           PSV cm/sEDV cm/sDescribe        Arm Pressure (mmHG) +----------+--------+--------+----------------+-------------------+ Subclavian110             Multiphasic, WNL                    +----------+--------+--------+----------------+-------------------+ +---------+--------+--+--------+--+---------+ VertebralPSV cm/s76EDV cm/s17Antegrade +---------+--------+--+--------+--+---------+   Summary: Right Carotid: Velocities in the right ICA are consistent with a 1-39% stenosis. Left Carotid: Velocities in the left ICA are consistent with a 1-39% stenosis. Vertebrals:  Bilateral vertebral arteries demonstrate antegrade flow. Subclavians: Normal flow hemodynamics were seen in bilateral subclavian              arteries. *See table(s) above for measurements and observations.  Electronically signed by Antony Contras MD on 10/22/2022 at 12:09:26 PM.    Final    VAS [name] LOWER EXTREMITY VENOUS (DVT)  Result Date: 10/22/2022  Lower Venous DVT Study Patient Name:  BERLINDA FARVE  Date of Exam:   10/22/2022 Medical Rec #: [id number]             Accession #:    [id number] Date of Birth: [birth year]            Patient Gender: F Patient Age:   34 years Exam Location:  [facility] Procedure:      VAS [name] LOWER EXTREMITY VENOUS (DVT) Referring Phys: Alferd Patee [facility] --------------------------------------------------------------------------------  Indications: Stroke.  Comparison Study: No prior studies. Performing Technologist: Darlin Coco RDMS, RVT  Examination  Guidelines: A complete evaluation includes B-mode imaging, spectral Doppler, color Doppler, and power Doppler as needed of all accessible portions of each vessel. Bilateral testing is considered an integral part of a complete examination. Limited examinations for reoccurring indications may be performed as noted. The reflux portion of the exam is performed with the patient in reverse Trendelenburg.  +---------+---------------+---------+-----------+----------+--------------+ RIGHT    CompressibilityPhasicitySpontaneityPropertiesThrombus Aging +---------+---------------+---------+-----------+----------+--------------+ CFV      Full  Yes      Yes                                 +---------+---------------+---------+-----------+----------+--------------+ SFJ      Full                                                        +---------+---------------+---------+-----------+----------+--------------+ FV Prox  Full                                                        +---------+---------------+---------+-----------+----------+--------------+ FV Mid   Full                                                        +---------+---------------+---------+-----------+----------+--------------+ FV DistalFull                                                        +---------+---------------+---------+-----------+----------+--------------+ PFV      Full                                                        +---------+---------------+---------+-----------+----------+--------------+ POP      Full           Yes      Yes                                 +---------+---------------+---------+-----------+----------+--------------+ PTV                                                   s/p BKA        +---------+---------------+---------+-----------+----------+--------------+ PERO                                                  s/p BKA         +---------+---------------+---------+-----------+----------+--------------+   +---------+---------------+---------+-----------+----------+--------------+ LEFT     CompressibilityPhasicitySpontaneityPropertiesThrombus Aging +---------+---------------+---------+-----------+----------+--------------+ CFV      Full           Yes      Yes                                 +---------+---------------+---------+-----------+----------+--------------+ SFJ      Full                                                        +---------+---------------+---------+-----------+----------+--------------+  FV Prox  Full                                                        +---------+---------------+---------+-----------+----------+--------------+ FV Mid   Full                                                        +---------+---------------+---------+-----------+----------+--------------+ FV DistalFull                                                        +---------+---------------+---------+-----------+----------+--------------+ PFV      Full                                                        +---------+---------------+---------+-----------+----------+--------------+ POP      Full           Yes      Yes                                 +---------+---------------+---------+-----------+----------+--------------+ PTV      Full                                                        +---------+---------------+---------+-----------+----------+--------------+ PERO     Full                                                        +---------+---------------+---------+-----------+----------+--------------+     Summary: RIGHT: - There is no evidence of deep vein thrombosis in the lower extremity.  - No cystic structure found in the popliteal fossa.  LEFT: - There is no evidence of deep vein thrombosis in the lower extremity.  - No cystic structure found in the popliteal fossa.   *See table(s) above for measurements and observations.    Preliminary    MR ANGIO HEAD WO CONTRAST  Result Date: 10/22/2022 CLINICAL DATA:  51 year old female with right side weakness, medial left perirolandic infarct on MRI yesterday, with possible additional punctate cortical infarcts in both hemispheres. EXAM: MRA HEAD WITHOUT CONTRAST TECHNIQUE: Angiographic images of the Circle of Willis were acquired using MRA technique without intravenous contrast. COMPARISON:  Brain MRI 10/21/2022.  Intracranial MRA 01/12/2018. FINDINGS: Anterior circulation: Antegrade flow in both ICA siphons appears stable since 2019. There is mild to moderate bilateral siphon irregularity and stenosis which has progressed in the supraclinoid right ICA on series 5, image 55. That on the left appears stable.  Carotid termini remain patent. Patent MCA and ACA origins. Normal anterior communicating artery. Visible bilateral ACA branches are stable and within normal limits. Bilateral MCA M1 segments and MCA branches remain patent. But there are new moderate bilateral MCA M2 origin stenoses, affecting the posterior division on the left and the anterior division on the right. See series 1051, image 3. The left M2 lesion appears moderate to severe on series 1045, image 24. No MCA branch occlusion identified. Posterior circulation: Distal vertebral arteries and vertebrobasilar junction appear patent and stable since 2019. Left AICA may be dominant and remains patent. Basilar tip, SCA and PCA origins are stable and within normal limits. Small posterior communicating arteries. Bilateral PCA branches are within normal limits. Anatomic variants: No intracranial mass effect or ventriculomegaly. Other: None significant. IMPRESSION: 1. Negative for vessel occlusion. 2. Positive for progressed intracranial atherosclerosis since 2019 with new moderate to severe Left M2 and moderate Right M2 origin stenoses from prior MRA. Up to moderate bilateral  supraclinoid ICA stenosis also appears increased on the Right. Electronically Signed   By: Genevie Ann M.D.   On: 10/22/2022 06:29   DG CHEST PORT 1 VIEW  Result Date: 10/21/2022 CLINICAL DATA:  Cerebrovascular accident. EXAM: PORTABLE CHEST 1 VIEW COMPARISON:  09/21/2022 FINDINGS: Right central venous catheter with tip over the right atrium. No pneumothorax. Shallow inspiration with elevation of the left hemidiaphragm. Cardiac enlargement. Lungs are clear. No pleural effusions. No pneumothorax. Mediastinal contours appear intact. Old rib fractures. Vascular graft in the right axillary region. IMPRESSION: Right central venous catheter placed with tip over the right atrium. No pneumothorax. Shallow inspiration. Cardiac enlargement. Lungs are clear. Electronically Signed   By: Lucienne Capers M.D.   On: 10/21/2022 23:09   MR Cervical Spine Wo Contrast  Result Date: 10/21/2022 CLINICAL DATA:  Right-sided weakness EXAM: MRI CERVICAL SPINE WITHOUT CONTRAST TECHNIQUE: Multiplanar, multisequence MR imaging of the cervical spine was performed. No intravenous contrast was administered. COMPARISON:  None Available. FINDINGS: Alignment: No listhesis. Vertebrae: No fracture, evidence of discitis, or bone lesion. Cord: Normal signal and morphology. Posterior Fossa, vertebral arteries, paraspinal tissues: Negative. Disc levels: C2-C3: No significant disc bulge. No spinal canal stenosis or neuroforaminal narrowing. C3-C4: No significant disc bulge. Left-greater-than-right uncovertebral hypertrophy. No spinal canal stenosis. Mild left neural foraminal narrowing. C4-C5: Minimal disc bulge. Mild spinal canal stenosis. No neural foraminal narrowing. C5-C6: Minimal disc bulge with right subarticular protrusion. Mild spinal canal stenosis. No neural foraminal narrowing. C6-C7: Minimal disc bulge. No spinal canal stenosis or neural foraminal narrowing. C7-T1: No significant disc bulge. No spinal canal stenosis or neuroforaminal  narrowing. IMPRESSION: 1. C4-C5 and C5-C6 mild spinal canal stenosis. 2. C3-C4 mild left neural foraminal narrowing. Electronically Signed   By: Merilyn Baba M.D.   On: 10/21/2022 22:49   MR BRAIN WO CONTRAST  Result Date: 10/21/2022 CLINICAL DATA:  Right-sided weakness EXAM: MRI HEAD WITHOUT CONTRAST TECHNIQUE: Multiplanar, multiecho pulse sequences of the brain and surrounding structures were obtained without intravenous contrast. COMPARISON:  MRI head 07/11/2018, correlation is also made with CT head 10/21/2022 FINDINGS: Brain: Restricted diffusion with ADC correlate in the left paramedian frontoparietal region (series 5, images 40-45), measuring up to 1.9 1.9 x 1.0 x 1.6 cm. Additional punctate foci of restricted diffusion with ADC correlate in the left frontal cortex (series 5, image 40)and right occipital cortex (series 5, image 20). Redemonstrated remote infarct in the right cerebellum. No acute hemorrhage, mass, mass effect, or midline shift. No hydrocephalus or extra-axial collection.  T2 hyperintense signal in the periventricular white matter, likely the sequela of moderate chronic small vessel ischemic disease. Vascular: Normal arterial flow voids. Skull and upper cervical spine: Normal marrow signal. Sinuses/Orbits: Mucosal thickening in the right-greater-than-left maxillary sinus and left sphenoid sinus. Status post bilateral lens replacements. Other: Fluid in the left-greater-than-right mastoid air cells. IMPRESSION: Acute infarct in the left paramedian frontoparietal region, with punctate additional acute infarcts in the left frontal cortex and right occipital cortex. Given multiple vascular territories, an embolic etiology is suspected. These results were called by telephone at the time of interpretation on 10/21/2022 at 10:27 pm to provider [facility] , who verbally acknowledged these results. Electronically Signed   By: Merilyn Baba M.D.   On: 10/21/2022 22:27   CT Head Wo  Contrast  Result Date: 10/21/2022 CLINICAL DATA:  Tingling in right arm after getting flu shot yesterday. Weird feeling in head and legs. EXAM: CT HEAD WITHOUT CONTRAST TECHNIQUE: Contiguous axial images were obtained from the base of the skull through the vertex without intravenous contrast. RADIATION DOSE REDUCTION: This exam was performed according to the departmental dose-optimization program which includes automated exposure control, adjustment of the mA and/or kV according to patient size and/or use of iterative reconstruction technique. COMPARISON:  07/11/2018. FINDINGS: Brain: No acute intracranial hemorrhage, midline shift or mass effect. No extra-axial fluid collection. Periventricular white matter hypodensities are present bilaterally. No hydrocephalus. There is an old infarct in the right cerebral hemisphere. Vascular: No hyperdense vessel or unexpected calcification. Skull: Normal. Negative for fracture or focal lesion. Sinuses/Orbits: Mild mucosal thickening in the maxillary sinuses bilaterally. There is partial opacification of the left sphenoid sinus. Other: None. IMPRESSION: 1. No acute intracranial process. 2. Chronic microvascular ischemic changes and old infarct in the right cerebellar hemisphere. Electronically Signed   By: Brett Fairy M.D.   On: 10/21/2022 20:13      Signature  Lala Lund M.D on 10/23/2022 at 9:46 AM   -  To page go to [email or web address]

## 2022-10-23 NOTE — Evaluation (Signed)
Physical Therapy Evaluation Patient Details Name: Heather [name] MRN: [id number] DOB: May 18, 1971 Today's Date: 10/23/2022  History of Present Illness  51 y.o. female presents to [facility] ED 10/22/22  with onset of RUE and RLE weakness and incoordination after receiving flu shot 10/25. MRI scan of the brain shows small acute infarcts in the left paramedian frontoparietal region and punctate infarcts in left frontal cortex and right occipital cortex. Transferred to [facility]. 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  Clinical Impression  PTA pt living with husband in single story home with 3 steps to enter. Pt utilizes wheelchair for mobility due to relatively recent pain with use of prosthetic. Husband does bump her up and down stairs in wheelchair. Pt independent with mobility in wheelchair (other than stairs), ADLs and iADLs. Pt with new onset of R sided muscle weakness which is limiting her mobilization. Pt is min guard for transfer to from bed to wheelchair on her R. Pt with use of knee and terminal end of residual LE as lever and to pull herself into wheelchair. Pt noted to have callus and tissue thinning at end of residual limb and on knee. Discussed possible reason for discomfort with prosthetic, encouraged discussion with prosthetist next week. Give exercises for proper R hip and knee alignment and strengthening. PT recommending Outpatient PT at [facility]. PT will continue to follow acutely.     Recommendations for follow up therapy are one component of a multi-disciplinary discharge planning process, led by the attending physician.  Recommendations may be updated based on patient status, additional functional criteria and insurance authorization.  Follow Up Recommendations Outpatient PT (Neurorehabilitation)       Assistance Recommended at Discharge Intermittent Supervision/Assistance  Patient can return home with the following  A little help with walking and/or transfers;A little help with bathing/dressing/bathroom;Assistance with cooking/housework;Assist for transportation;Help with stairs or ramp for entrance    Equipment Recommendations None recommended by PT  Recommendations for Other Services  OT consult    Functional Status Assessment Patient has had a recent decline in their functional status and demonstrates the ability to make significant improvements in function in a reasonable and predictable amount of time.     Precautions / Restrictions Precautions Precautions: Fall Restrictions Weight Bearing Restrictions: No RLE Weight Bearing:  (BKA)      Mobility  Bed Mobility Overal bed mobility: Modified Independent             General bed mobility comments: use of bed rail and incresed effort to come to sit EoB    Transfers Overall transfer level: Needs assistance Equipment used: None Transfers: Bed to chair/wheelchair/BSC            Lateral/Scoot Transfers: Min guard General transfer comment: min guard for safety, pt preference to transfer to wheelchair on R side, pt with increased use of knee and end of residual limb to pull over to wheelchair, educated on need for transfer to L side to build strength in L LE and to preserve R residual limb integrity          Balance Overall balance assessment: Needs assistance Sitting-balance support: Feet supported, No upper extremity supported, Bilateral upper extremity supported, Single extremity supported Sitting balance-Leahy Scale: Good  Pertinent Vitals/Pain Pain Assessment Pain Assessment: No/denies pain    Home Living Family/patient expects to be discharged to:: Private residence Living Arrangements: Spouse/significant other Available Help at Discharge:  Family Type of Home: House Home Access: Stairs to enter Entrance Stairs-Rails: Psychiatric nurse of Steps: 3   Home Layout: One level Home Equipment: IT sales professional (4 wheels);Tub bench;Hand held shower head      Prior Function Prior Level of Function : Independent/Modified Independent             Mobility Comments: independent with transfers car, toilet, wheelchair, husband and son provide transportation to HD ADLs Comments: independent in bathing/dressing, cooking, cleaning     Hand Dominance   Dominant Hand: Right    Extremity/Trunk Assessment   Upper Extremity Assessment Upper Extremity Assessment: Defer to OT evaluation    Lower Extremity Assessment Lower Extremity Assessment: RLE deficits/detail;LLE deficits/detail RLE Deficits / Details: New onset weakness 10/25, BKA in 2018, pt with prosthetic appointment 10/31 due to increased pain with prosthetic use, R hip flex 3+/5, hip AB 3+/5, hip AD 4/5 knee flex 4/5, knee ex 3+/5, RLE Sensation: WNL RLE Coordination: WNL LLE Deficits / Details: generalized weakness       Communication   Communication: No difficulties  Cognition Arousal/Alertness: Awake/alert Behavior During Therapy: WFL for tasks assessed/performed Overall Cognitive Status: Within Functional Limits for tasks assessed                                          General Comments General comments (skin integrity, edema, etc.): Pt has calluses on knee cap and at end of residual limb secodary to increased R residual limb and knee use to pull herself to wheelchair on R sided    Exercises  R hip and knee positioning and AROM    Assessment/Plan    PT Assessment Patient needs continued PT services  PT Problem List Decreased strength;Decreased range of motion;Decreased activity tolerance;Decreased balance;Decreased mobility;Decreased knowledge of use of DME;Decreased skin integrity       PT Treatment  Interventions DME instruction;Functional mobility training;Therapeutic activities;Therapeutic exercise;Balance training;Patient/family education;Neuromuscular re-education;Wheelchair mobility training    PT Goals (Current goals can be found in the Care Plan section)  Acute Rehab PT Goals Patient Stated Goal: walk with prosthetic PT Goal Formulation: With patient Time For Goal Achievement: 11/05/22 Potential to Achieve Goals: Good    Frequency Min 4X/week        AM-PAC PT "6 Clicks" Mobility  Outcome Measure Help needed turning from your back to your side while in a flat bed without using bedrails?: None Help needed moving from lying on your back to sitting on the side of a flat bed without using bedrails?: None Help needed moving to and from a bed to a chair (including a wheelchair)?: A Little Help needed standing up from a chair using your arms (e.g., wheelchair or bedside chair)?: Total Help needed to walk in hospital room?: Total Help needed climbing 3-5 steps with a railing? : Total 6 Click Score: 14    End of Session Equipment Utilized During Treatment: Gait belt Activity Tolerance: Patient tolerated treatment well Patient left: in bed;with call bell/phone within reach;with bed alarm set Nurse Communication: Mobility status PT Visit Diagnosis: Muscle weakness (generalized) (M62.81);Other abnormalities of gait and mobility (R26.89);Difficulty in walking, not elsewhere classified (R26.2);Hemiplegia and hemiparesis;Other symptoms and signs involving the nervous system (R29.898) Hemiplegia -  Right/Left: Right Hemiplegia - dominant/non-dominant: Dominant Hemiplegia - caused by: Cerebral infarction    Time: 0800-0905 PT Time Calculation (min) (ACUTE ONLY): 65 min   Charges:   PT Evaluation $PT Eval Moderate Complexity: 1 Mod PT Treatments $Therapeutic Activity: 8-22 mins $Neuromuscular Re-education: 23-37 mins        Haiden Rawlinson B. Migdalia Dk PT, DPT Acute Rehabilitation  Services Please use secure chat or  Call Office [phone]   [facility] 10/23/2022, 9:41 AM

## 2022-10-23 NOTE — Progress Notes (Signed)
[facility] Progress Note  Name: Heather [name] MRN: [id number] DOB: 09/19/1971  Chief Complaint:  Right sided weakness  Subjective:  Last HD outpatient on Wednesday and after HD was when she started to notice the right-sided weakness.  She has gotten a little strength back in her right leg and a little more in her right arm but still feels heavy and can't do much against resistance.  [name] working with PT now.  Discussed with primary team. Pt hasn't been on anticoagulation for 2-3 weeks.   Review of systems:  Denies shortness of breath  Denies chest pain  No n/v  -------------- Background on consult:  SABRIN DUNLEVY is a 51 y.o. female with a history including ESRD, type 2 diabetes, hypertension, hx CVA, DVT/PE on anticoagulation, known mitral valve vegetation, and irritable bowel syndrome who presented to the hospital with sudden onset of right-sided upper and lower extremity weakness.  She had presented to the [facility].  Neurology was consulted.  On imaging she was found to have embolic appearing infarcts.  Per charting she has been off of eliquis for two weeks.  She was transferred to [facility].  Nephrology is consulted for assistance with management of ESRD.  Her husband is at bedside.   They state that the weakness actually began after dialysis on Wednesday; she thought it would get better but it didn't.  Right leg is still weak.  Her right arm is weaker than baseline but getting a little better.    No intake or output data in the 24 hours ending 10/23/22 0817   Vitals:  Vitals:   10/22/22 1928 10/23/22 0000 10/23/22 0409 10/23/22 0812  BP: (!) 196/86 (!) 165/69 (!) 192/92 (!) 174/84  Pulse: 79 78 79 79  Resp: '16 16 16 18  '$ Temp: 98.1 F (36.7 C) 98 F (36.7 C) 97.9 F (36.6 C) 97.8 F (36.6 C)  TempSrc: Oral Oral Oral Oral  SpO2: 91% (!) 89% 98% 97%  Weight:      Height:         Physical Exam:   General: adult female in bed in NAD at rest    HEENT: NCAT Eyes: EOMI sclera anicteric Neck: supple trachea midline  Heart: S1S2 no rub Lungs: clear and unlabored on room air Abdomen: Soft/nt/nd; obese habitus  Extremities: no edema LLE or residual right limb Skin: no rash on extremities exposed Neuro: speech fluent, alert and oriented x 3 provides hx; gross right lower extremity weakness but able to raise against gravity- can't do much against resistance; she is able to move her right arm against gravity as well but grossly weaker than left Access RIJ Tunn catheter   Medications reviewed   Labs:     Latest Ref Rng & Units 10/23/2022    2:48 AM 10/22/2022    7:22 PM 10/21/2022    7:28 PM  BMP  Glucose 70 - 99 mg/dL 180  182  221   BUN 6 - 20 mg/dL 35  33  30   Creatinine 0.44 - 1.00 mg/dL 12.04  11.19  9.37   Sodium 135 - 145 mmol/L 142  141  141   Potassium 3.5 - 5.1 mmol/L 3.6  3.6  3.6   Chloride 98 - 111 mmol/L 100  101  102   CO2 22 - 32 mmol/L '27  27  29   '$ Calcium 8.9 - 10.3 mg/dL 10.3  10.1  9.7    Outpatient HD orders:  [place] 3 hours and 45 minutes  MWF BF 400 / DF 700  EDW 101.7 kg (reached 102.2 kg on 10/25 - but can meet EDW) 2K/2Ca Tunn catheter  Meds:  Getting cefepime 2 gram and vanc 1 gram with HD until 11/12/22 Hectorol 3 mcg every tx Mircera 200 mcg every 2 weeks (last given 10/15/22)    Assessment/Plan:   Acute embolic stroke - per neurology and primary team  - we post-poned HD until today due to the acute event - Will avoid dropping BP below 263-335 systolic on HD   Endocarditis  - on vanc 1 gram and cefepime 2 gram with each HD as an outpatient (through 11/12/22) - I spoke to primary team and he is placing orders for abx   HTN  - regimen per primary team and neurology in the setting of acute stroke    ESRD - Usually MWF outpatient  - HD today off schedule (delayed a day due to stroke)   Hx DVT/PE - anticoagulation per primary team discretion; does not appear to have been on  anticoagulation at home recently (past few weeks per report)   Anemia CKD - defer ESA in setting of acute CVA.  Hb fine at 45.6   Metabolic bone disease - continue hectorol as above    Disposition - continue inpatient monitoring   Claudia Desanctis, MD 10/23/2022 8:33 AM

## 2022-10-23 NOTE — Progress Notes (Signed)
Pharmacy Antibiotic Note  Heather [name] is a 51 y.o. female admitted on 10/21/2022 with acute CVA.  Pharmacy has been consulted to continue Vancomycin and Cefepime dosing in this patient who is on both antibiotics PTA for known endocarditis.  PTA on Vancomycin 1gm IV with HD on MWF (last dose 10/20/22) and Cefepime 2gm IV with HD on MWF (last dose 09/30/22). Dr. Candiss Norse called to confirm he wanted to restart both vancomycin and cefepime today with HD scheduled for 10/28.  Plan: Vancomycin 1 g IV post HD today and MWF thereafter Cefepime 2 g IV post HD today and MWF thereafter  Height: '5\' 9"'$  (175.3 cm) Weight: 103 kg (227 lb) IBW/kg (Calculated) : 66.2  Temp (24hrs), Avg:97.9 F (36.6 C), Min:97.4 F (36.3 C), Max:98.3 F (36.8 C)  Recent Labs  Lab 10/21/22 1828 10/21/22 1928 10/22/22 1922 10/23/22 0248  WBC 6.5  --   --  5.9  CREATININE  --  9.37* 11.19* 12.04*     Estimated Creatinine Clearance: 7 mL/min (A) (by C-G formula based on SCr of 12.04 mg/dL (H)).    Allergies  Allergen Reactions   Ibuprofen Other (See Comments)    CKD stage 3. Should avoid.   Dilaudid [Hydromorphone Hcl] Itching and Other (See Comments)    Can take with Benadryl.   Iodinated Contrast Media Rash   Tramadol Itching    Antimicrobials this admission:  Vancomycin 10/25 >> Cefepime 10/28 >>  Dose adjustments this admission:  None.    Thank you for allowing pharmacy to be a part of this patient's care.  Jerilynn Birkenhead, PharmD 10/23/2022 8:44 AM

## 2022-10-24 DIAGNOSIS — E119 Type 2 diabetes mellitus without complications: Secondary | ICD-10-CM | POA: Diagnosis not present

## 2022-10-24 DIAGNOSIS — I330 Acute and subacute infective endocarditis: Secondary | ICD-10-CM | POA: Diagnosis not present

## 2022-10-24 DIAGNOSIS — I639 Cerebral infarction, unspecified: Secondary | ICD-10-CM | POA: Diagnosis not present

## 2022-10-24 DIAGNOSIS — N186 End stage renal disease: Secondary | ICD-10-CM | POA: Diagnosis not present

## 2022-10-24 LAB — LIPID PANEL
Cholesterol: 193 mg/dL (ref 0–200)
HDL: 42 mg/dL (ref >40)
Total CHOL/HDL Ratio: 4.6 RATIO
Triglycerides: 217 mg/dL — ABNORMAL HIGH (ref <150)
VLDL: 43 mg/dL — ABNORMAL HIGH (ref 0–40)

## 2022-10-24 LAB — CBC
HCT: 35.7 % — ABNORMAL LOW (ref 36.0–46.0)
Hemoglobin: 11.1 g/dL — ABNORMAL LOW (ref 12.0–15.0)
MCH: 31.2 pg (ref 26.0–34.0)
MCHC: 31.1 g/dL (ref 30.0–36.0)
MCV: 100.3 fL — ABNORMAL HIGH (ref 80.0–100.0)
Platelets: 124 10*3/uL — ABNORMAL LOW (ref 150–400)
RBC: 3.56 MIL/uL — ABNORMAL LOW (ref 3.87–5.11)
RDW: 16.8 % — ABNORMAL HIGH (ref 11.5–15.5)
WBC: 5.6 10*3/uL (ref 4.0–10.5)
nRBC: 1.8 % — ABNORMAL HIGH (ref 0.0–0.2)

## 2022-10-24 LAB — RENAL FUNCTION PANEL
Albumin: 2.9 g/dL — ABNORMAL LOW (ref 3.5–5.0)
Anion gap: 14 (ref 5–15)
BUN: 18 mg/dL (ref 6–20)
CO2: 26 mmol/L (ref 22–32)
Calcium: 9.6 mg/dL (ref 8.9–10.3)
Chloride: 96 mmol/L — ABNORMAL LOW (ref 98–111)
Creatinine, Ser: 7.24 mg/dL — ABNORMAL HIGH (ref 0.44–1.00)
GFR, Estimated: 6 mL/min — ABNORMAL LOW (ref >60)
Glucose, Bld: 113 mg/dL — ABNORMAL HIGH (ref 70–99)
Phosphorus: 3.2 mg/dL (ref 2.5–4.6)
Potassium: 3.7 mmol/L (ref 3.5–5.1)
Sodium: 136 mmol/L (ref 135–145)

## 2022-10-24 LAB — GLUCOSE, CAPILLARY: Glucose-Capillary: 97 mg/dL (ref 70–99)

## 2022-10-24 LAB — HEPATITIS B SURFACE ANTIBODY, QUANTITATIVE: Hep B S AB Quant (Post): 522 m[IU]/mL (ref >9.9)

## 2022-10-24 MED ORDER — AMLODIPINE-OLMESARTAN 5-20 MG PO TABS: 1.0000 | ORAL_TABLET | Freq: Every day | ORAL | 0 refills | Status: DC

## 2022-10-24 MED ORDER — ROSUVASTATIN CALCIUM 5 MG PO TABS
10.0000 mg | ORAL_TABLET | Freq: Every day | ORAL | Status: DC
  Administered 2022-10-24: 10 mg via ORAL
  Filled 2022-10-24: qty 2

## 2022-10-24 MED ORDER — SODIUM CHLORIDE 0.9 % IV SOLN
2.0000 g | Freq: Once | INTRAVENOUS | Status: AC
  Administered 2022-10-24: 2 g via INTRAVENOUS
  Filled 2022-10-24: qty 12.5

## 2022-10-24 MED ORDER — ROSUVASTATIN CALCIUM 10 MG PO TABS: 10.0000 mg | ORAL_TABLET | Freq: Every day | ORAL | 0 refills | Status: DC

## 2022-10-24 MED ORDER — ASPIRIN 81 MG PO CHEW: 81.0000 mg | CHEWABLE_TABLET | Freq: Every day | ORAL | 0 refills | Status: DC

## 2022-10-24 NOTE — Discharge Instructions (Addendum)
Please follow-up with your family physician to see if you require Eliquis in the future, for now do not resume Eliquis until you have been asked to do so by your family physician.    Follow with Primary MD [name], MD in 7 days, discussed ongoing need for Eliquis.  Also follow-up with the recommended infectious disease and neurology physicians.  Get CBC, CMP,  -  checked next visit within 1 week by Primary MD    Activity: As tolerated with Full fall precautions use walker/cane & assistance as needed  Disposition Home   Diet: Renal low carbohydrate diet with 1.5 L fluid restriction per day. Check CBGs QAC-HS  Special Instructions: If you have smoked or chewed Tobacco  in the last 2 yrs please stop smoking, stop any regular Alcohol  and or any Recreational drug use.  On your next visit with your primary care physician please Get Medicines reviewed and adjusted.  Please request your Prim.MD to go over all Hospital Tests and Procedure/Radiological results at the follow up, please get all Hospital records sent to your Prim MD by signing hospital release before you go home.  If you experience worsening of your admission symptoms, develop shortness of breath, life threatening emergency, suicidal or homicidal thoughts you must seek medical attention immediately by calling 911 or calling your MD immediately  if symptoms less severe.  You Must read complete instructions/literature along with all the possible adverse reactions/side effects for all the Medicines you take and that have been prescribed to you. Take any new Medicines after you have completely understood and accpet all the possible adverse reactions/side effects.

## 2022-10-24 NOTE — Discharge Summary (Signed)
Heather [name] SVX:[id number] DOB: 02-18-1971 DOA: 10/21/2022  PCP: [name], MD  Admit date: 10/21/2022  Discharge date: 10/24/2022  Admitted From: Home   Disposition:  Home   Recommendations for Outpatient Follow-up:   Follow up with PCP in 1-2 weeks  PCP Please obtain BMP/CBC, 2 view CXR in 1week,  (see Discharge instructions)   PCP Please follow up on the following pending results: Please readdress need for ongoing Eliquis next visit, she has infective endocarditis with stroke.  High risk for hemorrhagic conversion.  Per patient DVT/PE was 1 episode around 5 to 6 years ago.  Monitor secondary risk factors for CVA.   Home Health: None   Equipment/Devices: None  Consultations: Neuro, Renal Discharge Condition: Stable    CODE STATUS: Full    Diet Recommendation: Renal-low carbohydrate diet with 1.5 L fluid restriction per day.  Check CBGs q. [place].    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 Of note the patient had not been taking her Eliquis since 10/05/22.                                                                 Hospital Course   Acute infarct in the left paramedian frontoparietal region, with punctate additional acute infarcts in the  left frontal cortex and right occipital cortex. Given multiple vascular territories, an embolic etiology is suspected.  He does have right-sided weakness and numbness which is improving, seen by stroke team Dr. Leonie Man, discussed with stroke team in detail on 10/22/2022, per Dr. Leonie Man her stroke likely is from the embolization from her endocarditis, no role for full anticoagulation, per Dr. Leonie Man full anticoagulation could cause increase chances of hemorrhagic conversion in the setting of endocarditis related embolic stroke.  [name] undergoing full stroke protocol, A1c is borderline at 7.6, carotid duplex stable, LDL was above goal, recently had TEE with known endocarditis, will get outpatient PT OT, full stroke work-up was done, symptoms much improved with improved right leg weakness and minimal right upper extremity paresthesias.   Placed on aspirin 81 mg per neuro team along with statin, PCP to readdress need for Eliquis in the future, of note patient was not taking any Eliquis for the last 2 weeks since  her last hospital discharge.   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   HTN - Permissive hypertension for now   Anemia of chronic kidney disease - Hemoglobin stable presently   ESRD on HD via dialysis catheter MWF - renal on board for HD provided here, continue outpatient HD and IV antibiotics with HD treatments as before.   Peripheral vascular disease status post R. BKA - supportive Rx.   History of DVT PE.  Per patient and husband over 5 to 6 years ago, recent lower extremity venous duplex unremarkable, hold further anticoagulation see discussion in #1 above.   DM type II.  Stable A1c, continue regimen requested to check CBGs q. [facility] S at home.  Lab Results  Component Value Date   HGBA1C 7.4 (H) 10/23/2022     Discharge diagnosis     Principal Problem:   Stroke [facility]) Active Problems:   Diabetes mellitus, type II, insulin dependent ([place]), on CGM, with end organ damage: renal, neuropathy, gastroparesis, retinopathy   Hypertension associated with diabetes ([place])   Anemia in chronic kidney disease, on chronic dialysis ([place])   ESRD on dialysis ([place])   S/P BKA (below knee amputation), right [facility])   Endocarditis    Discharge instructions    Discharge Instructions     Discharge instructions   Complete by: As directed    Please follow-up with your family physician to see if you require Eliquis in the future, for now do not resume Eliquis until you have been asked to do so by your family physician.    Follow with Primary MD [name], MD in 7 days, discussed ongoing need for Eliquis.  Also follow-up with the recommended infectious disease and neurology physicians.  Get CBC, CMP,  -  checked next visit within 1 week by Primary MD    Activity: As tolerated with Full fall precautions use walker/cane & assistance as needed  Disposition Home   Diet: Renal low carbohydrate diet with 1.5 L fluid restriction per day. Check CBGs QAC-HS  Special Instructions: If you have smoked or chewed Tobacco  in the last 2 yrs please stop smoking, stop any regular Alcohol  and or any Recreational drug use.  On your next visit with your primary care physician please Get Medicines reviewed and adjusted.  Please request your Prim.MD to go over all Hospital Tests and Procedure/Radiological results at the follow up, please get all Hospital records sent to your Prim MD by signing hospital release before you go home.  If you experience worsening of your admission symptoms, develop shortness of breath, life threatening emergency, suicidal or homicidal thoughts you must seek medical attention immediately by calling 911 or calling your MD immediately  if symptoms less severe.  You Must read complete  instructions/literature along with all the possible adverse reactions/side effects for all the Medicines you take and that have been prescribed to you. Take any new Medicines after you have completely understood and accpet all the possible adverse reactions/side effects.   Increase activity slowly   Complete by: As directed        Discharge Medications   Allergies as of 10/24/2022       Reactions   Ibuprofen Other (See Comments)   CKD stage 3. Should avoid.   Dilaudid [hydromorphone Hcl] Itching, Other (See Comments)   Can take with Benadryl.  Iodinated Contrast Media Rash   Tramadol Itching        Medication List     TAKE these medications    amLODipine-olmesartan 5-20 MG tablet Commonly known as: AZOR Take 1 tablet by mouth daily. Start taking on: October 26, 2022 What changed: These instructions start on October 26, 2022. If you are unsure what to do until then, ask your doctor or other care provider.   aspirin 81 MG chewable tablet Chew 1 tablet (81 mg total) by mouth daily.   azelastine 0.1 % nasal spray Commonly known as: ASTELIN Place 2 sprays into both nostrils 2 (two) times daily.   calcium acetate 667 MG capsule Commonly known as: PHOSLO Take 2,001 mg by mouth 3 (three) times daily with meals.   ceFEPIme 2 g in sodium chloride 0.9 % 100 mL Inject 2 g into the vein every Monday, Wednesday, and Friday at 8 PM.   clindamycin 1 % Swab Commonly known as: CLEOCIN T Apply 1 Application topically 3 (three) times a week.   Diflucan 100 MG tablet Generic drug: fluconazole Take by mouth.   fluticasone 50 MCG/ACT nasal spray Commonly known as: FLONASE Place 2 sprays into both nostrils daily. What changed:  when to take this reasons to take this   FreeStyle Libre 14 Day Sensor Misc 1 patch by Does not apply route every 14 (fourteen) days.   gabapentin 300 MG capsule Commonly known as: NEURONTIN Take 1 capsule (300 mg total) by mouth every Monday,  Wednesday, and Friday. At 10:30 am   HECTOROL IV Doxercalciferol (Hectorol)   Insulin Pen Needle 29G X 5MM Misc 1 Device by Does not apply route 4 (four) times daily.   ketoconazole 2 % cream Commonly known as: NIZORAL Apply 1 Application topically 2 (two) times a week.   loratadine 10 MG tablet Commonly known as: CLARITIN Take 10 mg by mouth daily.   MIRCERA IJ Mircera   NovoLOG FlexPen 100 UNIT/ML FlexPen Generic drug: insulin aspart Inject 6 Units into the skin 3 (three) times daily with meals. Sliding scale   Olopatadine HCl 0.2 % Soln Commonly known as: Pataday Apply 1 drop to eye daily. What changed:  when to take this reasons to take this   rosuvastatin 10 MG tablet Commonly known as: CRESTOR Take 1 tablet (10 mg total) by mouth daily. Start taking on: October 25, 2022   [name] FlexTouch 100 UNIT/ML FlexTouch Pen Generic drug: insulin degludec Inject 20 Units into the skin at bedtime. What changed: how much to take   vancomycin 1-5 GM/200ML-% Soln Commonly known as: VANCOCIN Inject 200 mLs (1,000 mg total) into the vein every Monday, Wednesday, and Friday with hemodialysis.         Follow-up Information     [name], MD. Schedule an appointment as soon as possible for a visit in 1 week(s).   Specialty: Family Medicine Contact information: [place] [address] [place]. Schedule an appointment as soon as possible for a visit in 2 week(s).   Contact information: [address]     [address]  [place] [address] [phone]        Carlyle Basques, MD. Schedule an appointment as soon as possible for a visit in 1 week(s).   Specialty: Infectious Diseases Why: Infective endocarditis Contact information: [address] [place] [address] [phone]  Major procedures and Radiology Reports - PLEASE review detailed  and final reports thoroughly  -     VAS [name] LOWER EXTREMITY VENOUS (DVT)  Result Date: 10/23/2022  Lower Venous DVT Study Patient Name:  SONJI STARKES  Date of Exam:   10/22/2022 Medical Rec #: [id number]             Accession #:    [id number] Date of Birth: 03/24/1971            Patient Gender: F Patient Age:   19 years Exam [facility]  [facility] Procedure:      VAS [name] LOWER EXTREMITY VENOUS (DVT) Referring Phys: Donnetta Simpers --------------------------------------------------------------------------------  Indications: Stroke.  Comparison Study: No prior studies. Performing Technologist: Darlin Coco RDMS, RVT  Examination Guidelines: A complete evaluation includes B-mode imaging, spectral Doppler, color Doppler, and power Doppler as needed of all accessible portions of each vessel. Bilateral testing is considered an integral part of a complete examination. Limited examinations for reoccurring indications may be performed as noted. The reflux portion of the exam is performed with the patient in reverse Trendelenburg.  +---------+---------------+---------+-----------+----------+--------------+ RIGHT    CompressibilityPhasicitySpontaneityPropertiesThrombus Aging +---------+---------------+---------+-----------+----------+--------------+ CFV      Full           Yes      Yes                                 +---------+---------------+---------+-----------+----------+--------------+ SFJ      Full                                                        +---------+---------------+---------+-----------+----------+--------------+ FV Prox  Full                                                        +---------+---------------+---------+-----------+----------+--------------+ FV Mid   Full                                                        +---------+---------------+---------+-----------+----------+--------------+ FV DistalFull                                                         +---------+---------------+---------+-----------+----------+--------------+ PFV      Full                                                        +---------+---------------+---------+-----------+----------+--------------+ POP      Full           Yes      Yes                                 +---------+---------------+---------+-----------+----------+--------------+  PTV                                                   s/p BKA        +---------+---------------+---------+-----------+----------+--------------+ PERO                                                  s/p BKA        +---------+---------------+---------+-----------+----------+--------------+   +---------+---------------+---------+-----------+----------+--------------+ LEFT     CompressibilityPhasicitySpontaneityPropertiesThrombus Aging +---------+---------------+---------+-----------+----------+--------------+ CFV      Full           Yes      Yes                                 +---------+---------------+---------+-----------+----------+--------------+ SFJ      Full                                                        +---------+---------------+---------+-----------+----------+--------------+ FV Prox  Full                                                        +---------+---------------+---------+-----------+----------+--------------+ FV Mid   Full                                                        +---------+---------------+---------+-----------+----------+--------------+ FV DistalFull                                                        +---------+---------------+---------+-----------+----------+--------------+ PFV      Full                                                        +---------+---------------+---------+-----------+----------+--------------+ POP      Full           Yes      Yes                                  +---------+---------------+---------+-----------+----------+--------------+ PTV      Full                                                        +---------+---------------+---------+-----------+----------+--------------+  PERO     Full                                                        +---------+---------------+---------+-----------+----------+--------------+     Summary: RIGHT: - There is no evidence of deep vein thrombosis in the lower extremity.  - No cystic structure found in the popliteal fossa.  LEFT: - There is no evidence of deep vein thrombosis in the lower extremity.  - No cystic structure found in the popliteal fossa.  *See table(s) above for measurements and observations. Electronically signed by Jamelle Haring on 10/23/2022 at 10:36:35 AM.    Final    VAS US CAROTID  Result Date: 10/22/2022 Carotid Arterial Duplex Study Patient Name:  MINAH AXELROD  Date of Exam:   10/22/2022 Medical Rec #: [id number]             Accession #:    [id number] Date of Birth: [birth year]            Patient Gender: F Patient Age:   8 years Exam Location:  [facility] Procedure:      VAS US CAROTID Referring Phys: Nyoka Lint DOUTOVA --------------------------------------------------------------------------------  Indications:       CVA. Risk Factors:      Hypertension, hyperlipidemia, Diabetes, prior CVA, PAD. Other Factors:     History of PE. Comparison Study:  No recent prior studies. Performing Technologist: Darlin Coco RDMS, RVT  Examination Guidelines: A complete evaluation includes B-mode imaging, spectral Doppler, color Doppler, and power Doppler as needed of all accessible portions of each vessel. Bilateral testing is considered an integral part of a complete examination. Limited examinations for reoccurring indications may be performed as noted.  Right Carotid Findings: +----------+--------+--------+--------+-----------------------+--------+           PSV cm/sEDV  cm/sStenosisPlaque Description     Comments +----------+--------+--------+--------+-----------------------+--------+ CCA Prox  96      14                                              +----------+--------+--------+--------+-----------------------+--------+ CCA Distal77      16                                              +----------+--------+--------+--------+-----------------------+--------+ ICA Prox  75      24      1-39%   diffuse and hyperechoic         +----------+--------+--------+--------+-----------------------+--------+ ICA Mid   77      21                                              +----------+--------+--------+--------+-----------------------+--------+ ICA Distal74      18                                              +----------+--------+--------+--------+-----------------------+--------+ ECA  83      10                                              +----------+--------+--------+--------+-----------------------+--------+ +----------+--------+-------+----------------+-------------------+           PSV cm/sEDV cmsDescribe        Arm Pressure (mmHG) +----------+--------+-------+----------------+-------------------+ [id number]            Multiphasic, WNL                    +----------+--------+-------+----------------+-------------------+ +---------+--------+--+--------+--+---------+ VertebralPSV cm/s71EDV cm/s14Antegrade +---------+--------+--+--------+--+---------+  Left Carotid Findings: +----------+--------+--------+--------+-----------------------+--------+           PSV cm/sEDV cm/sStenosisPlaque Description     Comments +----------+--------+--------+--------+-----------------------+--------+ CCA Prox  120     20                                              +----------+--------+--------+--------+-----------------------+--------+ CCA Distal77      17                                               +----------+--------+--------+--------+-----------------------+--------+ ICA Prox  40      11      1-39%   diffuse and hyperechoic         +----------+--------+--------+--------+-----------------------+--------+ ICA Mid   70      26                                              +----------+--------+--------+--------+-----------------------+--------+ ICA Distal71      22                                              +----------+--------+--------+--------+-----------------------+--------+ ECA       76      10                                              +----------+--------+--------+--------+-----------------------+--------+ +----------+--------+--------+----------------+-------------------+           PSV cm/sEDV cm/sDescribe        Arm Pressure (mmHG) +----------+--------+--------+----------------+-------------------+ Subclavian110             Multiphasic, WNL                    +----------+--------+--------+----------------+-------------------+ +---------+--------+--+--------+--+---------+ VertebralPSV cm/s76EDV cm/s17Antegrade +---------+--------+--+--------+--+---------+   Summary: Right Carotid: Velocities in the right ICA are consistent with a 1-39% stenosis. Left Carotid: Velocities in the left ICA are consistent with a 1-39% stenosis. Vertebrals:  Bilateral vertebral arteries demonstrate antegrade flow. Subclavians: Normal flow hemodynamics were seen in bilateral subclavian              arteries. *See table(s) above for measurements and observations.  Electronically signed by Antony Contras MD on 10/22/2022 at 12:09:26 PM.  Final    MR ANGIO HEAD WO CONTRAST  Result Date: 10/22/2022 CLINICAL DATA:  51 year old female with right side weakness, medial left perirolandic infarct on MRI yesterday, with possible additional punctate cortical infarcts in both hemispheres. EXAM: MRA HEAD WITHOUT CONTRAST TECHNIQUE: Angiographic images of the Circle of Willis were  acquired using MRA technique without intravenous contrast. COMPARISON:  Brain MRI 10/21/2022.  Intracranial MRA 01/12/2018. FINDINGS: Anterior circulation: Antegrade flow in both ICA siphons appears stable since 2019. There is mild to moderate bilateral siphon irregularity and stenosis which has progressed in the supraclinoid right ICA on series 5, image 55. That on the left appears stable. Carotid termini remain patent. Patent MCA and ACA origins. Normal anterior communicating artery. Visible bilateral ACA branches are stable and within normal limits. Bilateral MCA M1 segments and MCA branches remain patent. But there are new moderate bilateral MCA M2 origin stenoses, affecting the posterior division on the left and the anterior division on the right. See series 1051, image 3. The left M2 lesion appears moderate to severe on series 1045, image 24. No MCA branch occlusion identified. Posterior circulation: Distal vertebral arteries and vertebrobasilar junction appear patent and stable since 2019. Left AICA may be dominant and remains patent. Basilar tip, SCA and PCA origins are stable and within normal limits. Small posterior communicating arteries. Bilateral PCA branches are within normal limits. Anatomic variants: No intracranial mass effect or ventriculomegaly. Other: None significant. IMPRESSION: 1. Negative for vessel occlusion. 2. Positive for progressed intracranial atherosclerosis since 2019 with new moderate to severe Left M2 and moderate Right M2 origin stenoses from prior MRA. Up to moderate bilateral supraclinoid ICA stenosis also appears increased on the Right. Electronically Signed   By: Genevie Ann M.D.   On: 10/22/2022 06:29   DG CHEST PORT 1 VIEW  Result Date: 10/21/2022 CLINICAL DATA:  Cerebrovascular accident. EXAM: PORTABLE CHEST 1 VIEW COMPARISON:  09/21/2022 FINDINGS: Right central venous catheter with tip over the right atrium. No pneumothorax. Shallow inspiration with elevation of the left  hemidiaphragm. Cardiac enlargement. Lungs are clear. No pleural effusions. No pneumothorax. Mediastinal contours appear intact. Old rib fractures. Vascular graft in the right axillary region. IMPRESSION: Right central venous catheter placed with tip over the right atrium. No pneumothorax. Shallow inspiration. Cardiac enlargement. Lungs are clear. Electronically Signed   By: Lucienne Capers M.D.   On: 10/21/2022 23:09   MR Cervical Spine Wo Contrast  Result Date: 10/21/2022 CLINICAL DATA:  Right-sided weakness EXAM: MRI CERVICAL SPINE WITHOUT CONTRAST TECHNIQUE: Multiplanar, multisequence MR imaging of the cervical spine was performed. No intravenous contrast was administered. COMPARISON:  None Available. FINDINGS: Alignment: No listhesis. Vertebrae: No fracture, evidence of discitis, or bone lesion. Cord: Normal signal and morphology. Posterior Fossa, vertebral arteries, paraspinal tissues: Negative. Disc levels: C2-C3: No significant disc bulge. No spinal canal stenosis or neuroforaminal narrowing. C3-C4: No significant disc bulge. Left-greater-than-right uncovertebral hypertrophy. No spinal canal stenosis. Mild left neural foraminal narrowing. C4-C5: Minimal disc bulge. Mild spinal canal stenosis. No neural foraminal narrowing. C5-C6: Minimal disc bulge with right subarticular protrusion. Mild spinal canal stenosis. No neural foraminal narrowing. C6-C7: Minimal disc bulge. No spinal canal stenosis or neural foraminal narrowing. C7-T1: No significant disc bulge. No spinal canal stenosis or neuroforaminal narrowing. IMPRESSION: 1. C4-C5 and C5-C6 mild spinal canal stenosis. 2. C3-C4 mild left neural foraminal narrowing. Electronically Signed   By: Merilyn Baba M.D.   On: 10/21/2022 22:49   MR BRAIN WO CONTRAST  Result Date: 10/21/2022 CLINICAL DATA:  Right-sided weakness EXAM: MRI HEAD WITHOUT CONTRAST TECHNIQUE: Multiplanar, multiecho pulse sequences of the brain and surrounding structures were obtained  without intravenous contrast. COMPARISON:  MRI head 07/11/2018, correlation is also made with CT head 10/21/2022 FINDINGS: Brain: Restricted diffusion with ADC correlate in the left paramedian frontoparietal region (series 5, images 40-45), measuring up to 1.9 1.9 x 1.0 x 1.6 cm. Additional punctate foci of restricted diffusion with ADC correlate in the left frontal cortex (series 5, image 40)and right occipital cortex (series 5, image 20). Redemonstrated remote infarct in the right cerebellum. No acute hemorrhage, mass, mass effect, or midline shift. No hydrocephalus or extra-axial collection. T2 hyperintense signal in the periventricular white matter, likely the sequela of moderate chronic small vessel ischemic disease. Vascular: Normal arterial flow voids. Skull and upper cervical spine: Normal marrow signal. Sinuses/Orbits: Mucosal thickening in the right-greater-than-left maxillary sinus and left sphenoid sinus. Status post bilateral lens replacements. Other: Fluid in the left-greater-than-right mastoid air cells. IMPRESSION: Acute infarct in the left paramedian frontoparietal region, with punctate additional acute infarcts in the left frontal cortex and right occipital cortex. Given multiple vascular territories, an embolic etiology is suspected. These results were called by telephone at the time of interpretation on 10/21/2022 at 10:27 pm to provider [facility] , who verbally acknowledged these results. Electronically Signed   By: Merilyn Baba M.D.   On: 10/21/2022 22:27   CT Head Wo Contrast  Result Date: 10/21/2022 CLINICAL DATA:  Tingling in right arm after getting flu shot yesterday. Weird feeling in head and legs. EXAM: CT HEAD WITHOUT CONTRAST TECHNIQUE: Contiguous axial images were obtained from the base of the skull through the vertex without intravenous contrast. RADIATION DOSE REDUCTION: This exam was performed according to the departmental dose-optimization program which includes  automated exposure control, adjustment of the mA and/or kV according to patient size and/or use of iterative reconstruction technique. COMPARISON:  07/11/2018. FINDINGS: Brain: No acute intracranial hemorrhage, midline shift or mass effect. No extra-axial fluid collection. Periventricular white matter hypodensities are present bilaterally. No hydrocephalus. There is an old infarct in the right cerebral hemisphere. Vascular: No hyperdense vessel or unexpected calcification. Skull: Normal. Negative for fracture or focal lesion. Sinuses/Orbits: Mild mucosal thickening in the maxillary sinuses bilaterally. There is partial opacification of the left sphenoid sinus. Other: None. IMPRESSION: 1. No acute intracranial process. 2. Chronic microvascular ischemic changes and old infarct in the right cerebellar hemisphere. Electronically Signed   By: Brett Fairy M.D.   On: 10/21/2022 20:13   IR Fluoro Guide CV Line Right  Result Date: 10/04/2022 CLINICAL DATA:  Renal failure, on line holiday, needs new durable venous access for hemodialysis EXAM: TUNNELED HEMODIALYSIS CATHETER PLACEMENT WITH ULTRASOUND AND FLUOROSCOPIC GUIDANCE TECHNIQUE: The procedure, risks, benefits, and alternatives were explained to the patient. Questions regarding the procedure were encouraged and answered. The patient understands and consents to the procedure. As antibiotic prophylaxis, cefazolin 2 g was ordered pre-procedure and administered intravenously within one hour of incision.Patency of the right IJ vein was confirmed with ultrasound with image documentation. An appropriate skin site was determined. Region was prepped using maximum barrier technique including cap and mask, sterile gown, sterile gloves, large sterile sheet, and Chlorhexidine as cutaneous antisepsis. The region was infiltrated locally with 1% lidocaine. Intravenous Fentanyl 23mg and Versed '1mg'$  were administered as conscious sedation during continuous monitoring of the  patient's level of consciousness and physiological / cardiorespiratory status by the radiology RN, with a total moderate sedation time of 13 minutes. Under  real-time ultrasound guidance, the right IJ vein was accessed with a 21 gauge micropuncture needle; the needle tip within the vein was confirmed with ultrasound image documentation. Needle exchanged over the 018 guidewire for transitional dilator, which allowed advancement of a Benson wire into the IVC. Over this, an MPA catheter was advanced. A Palindrome 19 hemodialysis catheter was tunneled from the right anterior chest wall approach to the right IJ dermatotomy site. The MPA catheter was exchanged over an Amplatz wire for serial vascular dilators which allow placement of a peel-away sheath, through which the catheter was advanced under intermittent fluoroscopy, positioned with its tips in the proximal and midright atrium. Spot chest radiograph confirms good catheter position. No pneumothorax. Catheter was flushed and primed per protocol. Catheter secured externally with O Prolene sutures. The right IJ dermatotomy site was closed with Dermabond. COMPLICATIONS: COMPLICATIONS None immediate FLUOROSCOPY: Radiation Exposure Index (as provided by the fluoroscopic device): 10 mGy air Kerma COMPARISON:  None Available. IMPRESSION: 1. Technically successful placement of tunneled right IJ hemodialysis catheter with ultrasound and fluoroscopic guidance. Ready for routine use. ACCESS: Remains approachable for percutaneous intervention as needed. Electronically Signed   By: Lucrezia Europe M.D.   On: 10/04/2022 16:15   IR US Guide Vasc Access Right  Result Date: 10/04/2022 CLINICAL DATA:  Renal failure, on line holiday, needs new durable venous access for hemodialysis EXAM: TUNNELED HEMODIALYSIS CATHETER PLACEMENT WITH ULTRASOUND AND FLUOROSCOPIC GUIDANCE TECHNIQUE: The procedure, risks, benefits, and alternatives were explained to the patient. Questions regarding the  procedure were encouraged and answered. The patient understands and consents to the procedure. As antibiotic prophylaxis, cefazolin 2 g was ordered pre-procedure and administered intravenously within one hour of incision.Patency of the right IJ vein was confirmed with ultrasound with image documentation. An appropriate skin site was determined. Region was prepped using maximum barrier technique including cap and mask, sterile gown, sterile gloves, large sterile sheet, and Chlorhexidine as cutaneous antisepsis. The region was infiltrated locally with 1% lidocaine. Intravenous Fentanyl 33mg and Versed '1mg'$  were administered as conscious sedation during continuous monitoring of the patient's level of consciousness and physiological / cardiorespiratory status by the radiology RN, with a total moderate sedation time of 13 minutes. Under real-time ultrasound guidance, the right IJ vein was accessed with a 21 gauge micropuncture needle; the needle tip within the vein was confirmed with ultrasound image documentation. Needle exchanged over the 018 guidewire for transitional dilator, which allowed advancement of a Benson wire into the IVC. Over this, an MPA catheter was advanced. A Palindrome 19 hemodialysis catheter was tunneled from the right anterior chest wall approach to the right IJ dermatotomy site. The MPA catheter was exchanged over an Amplatz wire for serial vascular dilators which allow placement of a peel-away sheath, through which the catheter was advanced under intermittent fluoroscopy, positioned with its tips in the proximal and midright atrium. Spot chest radiograph confirms good catheter position. No pneumothorax. Catheter was flushed and primed per protocol. Catheter secured externally with O Prolene sutures. The right IJ dermatotomy site was closed with Dermabond. COMPLICATIONS: COMPLICATIONS None immediate FLUOROSCOPY: Radiation Exposure Index (as provided by the fluoroscopic device): 10 mGy air Kerma  COMPARISON:  None Available. IMPRESSION: 1. Technically successful placement of tunneled right IJ hemodialysis catheter with ultrasound and fluoroscopic guidance. Ready for routine use. ACCESS: Remains approachable for percutaneous intervention as needed. Electronically Signed   By: DLucrezia EuropeM.D.   On: 10/04/2022 16:15   IR Removal Tun Cv Cath W/O FL  Result Date:  10/01/2022 INDICATION: Patient with history of tunneled dialysis catheter last exchanged in IR 12/29/21 by Dr. Kathlene Cote. [name] now admitted with bacteremia and request made for removal for line holiday. EXAM: REMOVAL TUNNELED CENTRAL VENOUS CATHETER MEDICATIONS: 10 mL 1% lidocaine ANESTHESIA/SEDATION: None FLUOROSCOPY TIME:  None COMPLICATIONS: None immediate. PROCEDURE: Informed written consent was obtained from the patient after a thorough discussion of the procedural risks, benefits and alternatives. All questions were addressed. Maximal Sterile Barrier Technique was utilized including caps, mask, sterile gowns, sterile gloves, sterile drape, hand hygiene and skin antiseptic. A timeout was performed prior to the initiation of the procedure. The patient's left chest and catheter was prepped and draped in a normal sterile fashion. Heparin was removed from both ports of catheter. 1% lidocaine was used for local anesthesia. Using gentle blunt dissection the cuff of the catheter was exposed and the catheter was removed in its entirety. Pressure was held till hemostasis was obtained. A sterile dressing was applied. The patient tolerated the procedure well with no immediate complications. IMPRESSION: Successful catheter removal as described above. Read by: Brynda Greathouse PA-C Electronically Signed   By: Markus Daft M.D.   On: 10/01/2022 17:33   ECHO TEE  Result Date: 09/30/2022    TRANSESOPHOGEAL ECHO REPORT   Patient Name:   TYSHANA NISHIDA Date of Exam: 09/30/2022 Medical Rec #:  [id number]            Height:       69.0 in Accession #:     [id number]           Weight:       236.0 lb Date of Birth:  [birth year]           BSA:          2.217 m Patient Age:    15 years             BP:           88/49 mmHg Patient Gender: F                    HR:           86 bpm. 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 Stroke, Mitral Valve Disease,                  Signs/Symptoms:Shortness of Breath; Risk Factors:Diabetes,                  Hypertension and Dyslipidemia.  Sonographer:     Greer Pickerel Sonographer#2:   Roseanna Rainbow RDCS Referring Phys:  Jerline Pain Diagnosing Phys: Candee Furbish MD PROCEDURE: After discussion of the risks and benefits of a TEE, an informed consent was obtained from the patient. The transesophogeal probe was passed without difficulty through the esophogus of the patient. Imaged were obtained with the patient in a left lateral decubitus position. Sedation performed by different physician. The patient's vital signs; including heart rate, blood pressure, and oxygen saturation; remained stable throughout the procedure. The patient developed no complications during the procedure. IMPRESSIONS  1. Large vegetation on the mitral valve.  2. Left ventricular ejection fraction, by estimation, is  60 to 65%. The left ventricle has normal function.  3. Right ventricular systolic function is normal. The right ventricular size is normal.  4. Left atrial size was mildly dilated. No left atrial/left atrial appendage thrombus was detected.  5. Right atrial size was mildly dilated.  6. A small pericardial effusion is present. The pericardial effusion is circumferential. There is no evidence of cardiac tamponade.  7. 2.4 x 0.4 cm vegetation attached below posterior leaflet. The mitral valve is degenerative. Mild mitral valve  regurgitation. No evidence of mitral stenosis. Moderate mitral annular calcification.  8. The aortic valve is normal in structure. Aortic valve regurgitation is not visualized.  9. Thrombus material on dialysis catheter leaflet tip in the right atrium (chronic seen on prior study). Conclusion(s)/Recommendation(s): Findings are concerning for vegetation/infective endocarditis as detailed above. Admit to hospital for ID/CT surgery consultation. FINDINGS  Left Ventricle: Left ventricular ejection fraction, by estimation, is 60 to 65%. The left ventricle has normal function. The left ventricular internal cavity size was normal in size. Right Ventricle: The right ventricular size is normal. No increase in right ventricular wall thickness. Right ventricular systolic function is normal. Left Atrium: Left atrial size was mildly dilated. No left atrial/left atrial appendage thrombus was detected. Right Atrium: Right atrial size was mildly dilated. Pericardium: A small pericardial effusion is present. The pericardial effusion is circumferential. There is no evidence of cardiac tamponade. Mitral Valve: 2.4 x 0.4 cm vegetation attached below posterior leaflet. The mitral valve is degenerative in appearance. Moderate mitral annular calcification. A large vegetation is seen on the posterior mitral leaflet. The MV vegetation measures 24 mm x 4 mm. Mild mitral valve regurgitation. No evidence of mitral valve stenosis. MV peak gradient, 6.0 mmHg. The mean mitral valve gradient is 3.0 mmHg. Tricuspid Valve: The tricuspid valve is normal in structure. Tricuspid valve regurgitation is mild. Aortic Valve: The aortic valve is normal in structure. Aortic valve regurgitation is not visualized. Pulmonic Valve: The pulmonic valve was normal in structure. Pulmonic valve regurgitation is trivial. Aorta: The aortic root is normal in size and structure. IAS/Shunts: No atrial level shunt detected by color flow Doppler.  MITRAL VALVE               TRICUSPID VALVE MV Peak grad: 6.0 mmHg    TR Peak grad:   25.4 mmHg MV Mean grad: 3.0 mmHg    TR Vmax:        252.00 cm/s MV Vmax:      1.22 m/s MV Vmean:     86.2 cm/s MR Peak grad: 89.5 mmHg MR Mean grad: 54.0 mmHg MR Vmax:      473.00 cm/s MR Vmean:     345.0 cm/s Candee Furbish MD Electronically signed by Candee Furbish MD Signature Date/Time: 09/30/2022/2:14:15 PM    Final     Today   Subjective    Hermelinda Medicus today has no headache,no chest abdominal pain,no new weakness tingling or numbness, feels much better wants to go home today.    Objective   Blood pressure (!) 171/82, pulse 80, temperature 98.4 F (36.9 C), temperature source Oral, resp. rate 17, height '5\' 9"'$  (1.753 m), weight 103 kg, last menstrual period 09/10/2021, SpO2 98 %.   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,   Data Review   Recent Labs  Lab 10/21/22 1828 10/23/22 0248 10/24/22 0247  WBC 6.5 5.9 5.6  HGB 11.6* 10.3* 11.1*  HCT 39.5 33.2* 35.7*  PLT 153 141* 124*  MCV 104.5* 100.3* 100.3*  MCH 30.7 31.1 31.2  MCHC 29.4* 31.0 31.1  RDW 17.0* 16.8* 16.8*    Recent Labs  Lab 10/21/22 1928 10/22/22 1922 10/23/22 0248 10/24/22 0247  NA 141 141 142 136  K 3.6 3.6 3.6 3.7  CL 102 101 100 96*  CO2 '29 27 27 26  '$ GLUCOSE 221* 182* 180* 113*  BUN 30* 33* 35* 18  CREATININE 9.37* 11.19* 12.04* 7.24*  CALCIUM 9.7 10.1 10.3 9.6  AST 18  --   --   --   ALT 6  --   --   --   ALKPHOS 90  --   --   --   BILITOT 0.5  --   --   --   ALBUMIN 3.2* 3.0* 2.8* 2.9*  MG 2.3  --   --   --   PHOS  --  5.2* 5.3* 3.2   HGBA1C  --   --  7.4*  --     Total Time in preparing paper work, data evaluation and todays exam - 35 minutes  Lala Lund M.D on 10/24/2022 at 8:08 AM  [facility]

## 2022-10-24 NOTE — TOC Transition Note (Signed)
Transition of Care [facility]) - CM/SW Discharge Note   Patient Details  Name: Heather [name] MRN: [id number] Date of Birth: October 18, 1971  [facility] CM/SW Contact:  Carles Collet, RN Phone Number: 10/24/2022, 9:20 AM   Clinical Narrative:     Referral placed electronically for PT OT ST to neuro rehab.  Patient advised to call Monday to expedite scheduling of appointment. No other TOC needs identified.   Final next level of care: Home/Self Care Barriers to Discharge: No Barriers Identified   Patient Goals and CMS Choice        Discharge Placement                       Discharge Plan and Services                                     Social Determinants of Health (SDOH) Interventions     Readmission Risk Interventions     No data to display

## 2022-10-24 NOTE — Progress Notes (Signed)
[facility] Progress Note  Name: Heather [name] MRN: [id number] DOB: July 28, 1971  Chief Complaint:  Right sided weakness  Subjective: Last HD on 10/28 with 1.7 kg UF.  Can't locate a treatment note but she is charted as having tolerated the treatment.  She missed her cefepime dose on 10/28.  She states her HD treatment went well and they watched her BP closely.  She didn't notice any new or worsening weakness afterward or any adverse effects.  Right arm is stronger than right leg and she's working with therapy.   Review of systems: Denies shortness of breath  Denies chest pain  No n/v  -------------- Background on consult:  Heather [name] is a 51 y.o. female with a history including ESRD, type 2 diabetes, hypertension, hx CVA, DVT/PE on anticoagulation, known mitral valve vegetation, and irritable bowel syndrome who presented to the hospital with sudden onset of right-sided upper and lower extremity weakness.  She had presented to the [facility].  Neurology was consulted.  On imaging she was found to have embolic appearing infarcts.  Per charting she has been off of eliquis for two weeks.  She was transferred to [facility].  Nephrology is consulted for assistance with management of ESRD.  Her husband is at bedside.   They state that the weakness actually began after dialysis on Wednesday; she thought it would get better but it didn't.  Right leg is still weak.  Her right arm is weaker than baseline but getting a little better.  Last HD outpatient on Wednesday and after HD was when she started to notice the right-sided weakness.     Intake/Output Summary (Last 24 hours) at 10/24/2022 0735 Last data filed at 10/23/2022 2135 Gross per 24 hour  Intake 560 ml  Output 1700 ml  Net -1140 ml     Vitals:  Vitals:   10/23/22 2113 10/23/22 2135 10/23/22 2325 10/24/22 0341  BP: 133/76  (!) 152/73 (!) 153/80  Pulse: 77 73 73 70  Resp: '15 15 17 16  '$ Temp: 98.1 F (36.7  C)  98.2 F (36.8 C) 98.2 F (36.8 C)  TempSrc: Oral  Oral Oral  SpO2: 95% 95% 99% 100%  Weight:      Height:         Physical Exam:   General: adult female in bed in NAD at rest   HEENT: NCAT Eyes: EOMI sclera anicteric Neck: supple trachea midline  Heart: S1S2 no rub Lungs: clear and unlabored on room air Abdomen: Soft/nt/nd; obese habitus  Extremities: no edema LLE or residual right limb Skin: no rash on extremities exposed Neuro: speech fluent, alert and oriented x 3 provides hx; gross right lower extremity weakness but able to raise against gravity- can't raise against resistance; she is able to move her right arm against gravity as well but grossly weaker than left Access RIJ Tunn catheter   Medications reviewed   Labs:     Latest Ref Rng & Units 10/24/2022    2:47 AM 10/23/2022    2:48 AM 10/22/2022    7:22 PM  BMP  Glucose 70 - 99 mg/dL 113  180  182   BUN 6 - 20 mg/dL 18  35  33   Creatinine 0.44 - 1.00 mg/dL 7.24  12.04  11.19   Sodium 135 - 145 mmol/L 136  142  141   Potassium 3.5 - 5.1 mmol/L 3.7  3.6  3.6   Chloride 98 - 111 mmol/L 96  100  101   CO2 22 - 32 mmol/L '26  27  27   '$ Calcium 8.9 - 10.3 mg/dL 9.6  10.3  10.1    Outpatient HD orders:  [place] 3 hours and 45 minutes  MWF BF 400 / DF 700  EDW 101.7 kg (reached 102.2 kg on 10/25 - but can meet EDW) 2K/2Ca Tunn catheter  Meds:  Getting cefepime 2 gram and vanc 1 gram with HD until 11/12/22 Hectorol 3 mcg every tx Mircera 200 mcg every 2 weeks (last given 10/15/22)    Assessment/Plan:   Acute embolic stroke - per neurology and primary team  - the HD treatment after her stroke was post-poned by a day to optimize perfusion  - Will avoid dropping BP below 993-570 systolic on HD   Endocarditis  - on vanc 1 gram and cefepime 2 gram with each HD as an outpatient (through 11/12/22) - she missed her cefepime dose on 10/28 (had hadn't had since outpatient HD on the preceding Wednesday, 10/25).   I spoke with her nurse and she is going to give it   HTN  - regimen per primary team and neurology in the setting of acute stroke    ESRD - Usually MWF outpatient.  Her initial treatment here was post-poned to optimize her post-acute stroke - Resume MWF schedule for HD    Hx DVT/PE - anticoagulation per primary team discretion; does not appear to have been on anticoagulation at home recently (past few weeks per report)   Anemia CKD - defer ESA in setting of acute CVA.  Hb fine at 17.7   Metabolic bone disease - continue hectorol as above    Disposition - disposition per primary team   Claudia Desanctis, MD 10/24/2022

## 2022-10-24 NOTE — Evaluation (Signed)
Speech Language Pathology Evaluation Patient Details Name: Heather [name] MRN: [id number] DOB: 1971/04/30 Today's Date: 10/24/2022 Time: 2992-4268 SLP Time Calculation (min) (ACUTE ONLY): 18 min  Problem List:  Patient Active Problem List   Diagnosis Date Noted   Stroke ([place]) 10/21/2022   Allergic conjunctivitis 10/12/2022   Mitral valve mass    Right atrial mass    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   Shortness of breath 07/02/2022   Thrombus of venous dialysis catheter ([place]) 02/25/2022   Displacement of vascular dialysis catheter, initial encounter ([place]) 12/18/2021   S/P BKA (below knee amputation), right ([place]) 03/31/2021   Impaired mobility and ADLs 03/25/2021   CVA (cerebral vascular accident) ([place]) 03/04/2021   Pulmonary embolus ([place]) 03/04/2021   Charcot ankle, right 02/23/2021   Hx of cardiac arrest 11/12/2020   Abnormal CT of the chest 09/26/2020   Allergy, unspecified, initial encounter 09/22/2020   Other mechanical complication of other cardiac and vascular devices and implants, initial encounter ([place]) 05/10/2020   Positive ANA (antinuclear antibody) 04/16/2020   ESRD on dialysis [facility])    Hemiplegia and hemiparesis following cerebral infarction affecting left non-dominant side ([place]) 12/29/2018   IBS (irritable bowel syndrome) 08/06/2017   Dyslipidemia associated with type 2 diabetes mellitus ([place]) 06/25/2016   Proliferative diabetic retinopathy ([place]) 09/29/2015   Chronic cough 10/04/2014   B12 deficiency 05/10/2014   Anemia in chronic kidney disease, on chronic dialysis ([place]) 04/26/2014   CKD (chronic kidney disease) stage 5, GFR less than 15 ml/min (HCC) 04/26/2014   Secondary hyperparathyroidism of renal origin ([place]) 11/27/2013   Posterior subcapsular cataract, bilateral 10/18/2013   Diabetes mellitus, type II, insulin dependent ([place]), on CGM, with end organ damage: renal, neuropathy, gastroparesis, retinopathy 02/19/2011   Hypertension associated with diabetes ([place])  02/19/2011   Gastroparesis due to DM ([place]) 02/19/2011   Past Medical History:  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[place]), nonhemorrhagic, inferior right cerebellum 12/03/2017   left side weakness in leg   Cyst of left ovary 01/12/2018   Diabetic neuropathy, painful ([place]), on low dose Gabapentin 07/13/2013   DM (diabetes mellitus), type 2, uncontrolled, with renal complications 34/19/6222   DM2 (diabetes mellitus, type 2) ([place])    Dysfunction of left eustachian tube, with pusatile tinnitus 11/24/2017   Dyslipidemia associated with type 2 diabetes mellitus ([place]) 06/25/2016   ESRD (end stage renal disease) on dialysis [facility])    On hemodialysis in July 2019 via [facility] then switched to [facility] in Nov 2019.    ESRD with anemia (HCC)    Fibromyalgia    Gastroparesis due to DM    GERD (gastroesophageal reflux disease)    Headache    Hypertension associated with diabetes ([place]) 02/19/2011   IBS (irritable bowel syndrome)    Left-sided weakness 12/10/2017   .   Leiomyoma of uterus    Pancreatitis    PE (pulmonary thromboembolism) ([place]) 11/04/2019   Pneumonia    Proliferative diabetic retinopathy ([place]) 09/29/2015   Pronation deformity of both feet 06/14/2014   Reactive depression 12/10/2017   .   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[place]) 02/17/2018   Thromboembolism ([place]) 04/05/2018   Upper airway cough syndrome, with recs to stay off ACE and take Pepcid q hs 11/15/2016  Past Surgical History:  Past Surgical History:  Procedure Laterality Date   A/V FISTULAGRAM Right 03/03/2020   Procedure: Venogram;  Surgeon: Waynetta Sandy, MD;  Location: [place] [facility];  Service: Cardiovascular;   Laterality: Right;   ARTERY REPAIR Right 02/17/2018   Procedure: EXPLORATION OF RIGHT BRACHIAL ARTERY;  Surgeon: Conrad Pope, MD;  Location: [place];  Service: Vascular;  Laterality: Right;   ARTERY REPAIR Right 02/17/2018   Procedure: BRACHIAL ARTERY EXPLORATION AND TRHOMBECTOMY;  Surgeon: Conrad Hatfield, MD;  Location: [place];  Service: Vascular;  Laterality: Right;   AV FISTULA PLACEMENT Left 05/09/2017   Procedure: INSERTION OF ARTERIOVENOUS (AV) GRAFT ARM (ARTEGRAFT);  Surgeon: Conrad Baker, MD;  Location: [place];  Service: Vascular;  Laterality: Left;   AV FISTULA PLACEMENT Right 02/17/2018   Procedure: INSERTION OF ARTERIOVENOUS (AV) GORE-TEX GRAFT ARM RIGHT UPPER ARM;  Surgeon: Conrad Deshler, MD;  Location: [place];  Service: Vascular;  Laterality: Right;   AV FISTULA PLACEMENT Left 10/10/2019   Procedure: INSERTION OF LEFT ARTERIOVENOUS (AV) ARTEGRAFT GRAFT ARM;  Surgeon: Waynetta Sandy, MD;  Location: [place];  Service: Vascular;  Laterality: Left;   AV FISTULA PLACEMENT Right 03/19/2020   Procedure: INSERTION OF ARTERIOVENOUS (AV) GORE-TEX GRAFT IN RIGHT ARM AND VIABAHN STENT IN RIGHT AXILLARY ARTERY;  Surgeon: Waynetta Sandy, MD;  Location: [place];  Service: Vascular;  Laterality: Right;   [place] Left 08/31/2016   Procedure: BASILIC VEIN TRANSPOSITION FIRST STAGE;  Surgeon: Conrad Hugo, MD;  Location: [place];  Service: Vascular;  Laterality: Left;   BRONCHIAL WASHINGS  09/17/2021   Procedure: BRONCHIAL WASHINGS;  Surgeon: Collene Gobble, MD;  Location: [facility];  Service: Cardiopulmonary;;   CENTRAL VENOUS CATHETER INSERTION Left 07/24/2018   Procedure: INSERTION CENTRAL LINE ADULT;  Surgeon: Conrad Winton, MD;  Location: [place];  Service: Vascular;  Laterality: Left;   EYE SURGERY     secondary to diabetic retinopathy    FOOT SURGERY Right    t"ook bone out- maybe hammer toe"   INSERTION OF DIALYSIS CATHETER Left 07/24/2018   Procedure: INSERTION OF  TUNNELED DIALYSIS CATHETER;  Surgeon: Conrad [place] Providence, MD;  Location: [place];  Service: Vascular;  Laterality: Left;   IR FLUORO GUIDE CV LINE LEFT  12/19/2021   IR FLUORO GUIDE CV LINE LEFT  12/29/2021   IR FLUORO GUIDE CV LINE RIGHT  07/21/2018   IR FLUORO GUIDE CV LINE RIGHT  06/01/2019   IR FLUORO GUIDE CV LINE RIGHT  05/05/2020   IR FLUORO GUIDE CV LINE RIGHT  10/04/2022   IR REMOVAL TUN CV CATH W/O FL  01/23/2021   IR REMOVAL TUN CV CATH W/O FL  12/19/2021   IR REMOVAL TUN CV CATH W/O FL  10/01/2022   IR US GUIDE VASC ACCESS LEFT  12/19/2021   IR US GUIDE VASC ACCESS RIGHT  07/21/2018   IR US GUIDE VASC ACCESS RIGHT  06/01/2019   IR US GUIDE VASC ACCESS RIGHT  10/04/2022   LIGATION ARTERIOVENOUS GORTEX GRAFT Right 03/20/2020   Procedure: LIGATION ARM ARTERIOVENOUS GORTEX GRAFT With Patch Angioplasty, Thrombectomy;  Surgeon: Waynetta Sandy, MD;  Location: [place];  Service: Vascular;  Laterality: Right;   LIGATION OF ARTERIOVENOUS  FISTULA Left 05/09/2017   Procedure: LIGATION OF ARTERIOVENOUS  FISTULA;  Surgeon: Conrad Taylor, MD;  Location: [place];  Service: Vascular;  Laterality: Left;   LOOP RECORDER INSERTION N/A 12/05/2017  Procedure: LOOP RECORDER INSERTION;  Surgeon: Evans Lance, MD;  Location: [place] [facility];  Service: Cardiovascular;  Laterality: N/A;   MYOMECTOMY     REMOVAL OF GRAFT Right 02/17/2018   Procedure: REMOVAL OF RIGHT UPPER ARM ARTERIOVENOUS GRAFT;  Surgeon: Conrad Avon, MD;  Location: [place];  Service: Vascular;  Laterality: Right;   REVISION OF ARTERIOVENOUS GORETEX GRAFT Left 07/24/2018   Procedure: REDO ARTERIOVENOUS GORETEX GRAFT;  Surgeon: Conrad Montezuma, MD;  Location: [place];  Service: Vascular;  Laterality: Left;   TEE WITHOUT CARDIOVERSION N/A 12/05/2017   Procedure: TRANSESOPHAGEAL ECHOCARDIOGRAM (TEE);  Surgeon: Sanda Klein, MD;  Location: [place];  Service: Cardiovascular;  Laterality: N/A;   TEE WITHOUT CARDIOVERSION N/A 09/30/2022    Procedure: TRANSESOPHAGEAL ECHOCARDIOGRAM (TEE);  Surgeon: Jerline Pain, MD;  Location: [facility];  Service: Cardiovascular;  Laterality: N/A;   UPPER EXTREMITY VENOGRAPHY Left 07/13/2018   Procedure: UPPER EXTREMITY VENOGRAPHY - Central & Left Arm;  Surgeon: Conrad Loma, MD;  Location: [place] [facility];  Service: Cardiovascular;  Laterality: Left;   UPPER EXTREMITY VENOGRAPHY Bilateral 09/10/2019   Procedure: UPPER EXTREMITY VENOGRAPHY;  Surgeon: Waynetta Sandy, MD;  Location: [place] [facility];  Service: Cardiovascular;  Laterality: Bilateral;   UTERINE FIBROID SURGERY     VIDEO BRONCHOSCOPY N/A 09/17/2021   Procedure: VIDEO BRONCHOSCOPY WITHOUT FLUORO;  Surgeon: Collene Gobble, MD;  Location: [facility];  Service: Cardiopulmonary;  Laterality: N/A;   VITRECTOMY Bilateral    HPI:  Pt is a 51 y.o. female who presented to the ED on 10/22/22 due to RUE and RLE weakness and incoordination after receiving flu shot 10/25. MRI brain 10/26: Acute infarct in the left paramedian frontoparietal region, with punctate additional acute infarcts in the left frontal cortex and right occipital cortex. 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   Assessment / Plan / Recommendation Clinical Impression  Pt participated in speech-language-cognition evaluation with her husband present for part of the evaluation. Pt reported that she completed two years of college, is employed full time doing worker's compensation, and she denied any baseline deficits in speech, language, or cognition. Pt stated that her processing speed has now been slower and that she has been having some difficulty with word retrieval. The [facility] Mental Status Examination was completed to evaluate the pt's cognitive-linguistic skills. She achieved a score  of 24/30 which is below the normal limits of 27 or more out of 30 and is suggestive of a mild impairment. She exhibited difficulty in the areas of attention, memory, and executive function. Motor speech skills were WNL and she demonstrated functional language skills with intermittent need for additional processing time and/or repetition. Skilled SLP services are clinically indicated at this time.    SLP Assessment  SLP Recommendation/Assessment: Patient needs continued Speech [place] Pathology Services SLP Visit Diagnosis: Cognitive communication deficit (R41.841)    Recommendations for follow up therapy are one component of a multi-disciplinary discharge planning process, led by the attending physician.  Recommendations may be updated based on patient status, additional functional criteria and insurance authorization.    Follow Up Recommendations  Outpatient SLP    Assistance Recommended at Discharge  Set up Supervision/Assistance  Functional Status Assessment Patient has had a recent decline in their functional status and demonstrates the ability to make  significant improvements in function in a reasonable and predictable amount of time.  Frequency and Duration min 2x/week  2 weeks      SLP Evaluation Cognition  Overall Cognitive Status: Impaired/Different from baseline Arousal/Alertness: Awake/alert Orientation Level: Oriented X4 Year: 2023 Day of Week: Correct Attention: Focused;Sustained Focused Attention: Appears intact Memory: Impaired Memory Impairment: Decreased short term memory;Decreased recall of new information;Retrieval deficit (Immediate: 5/5 with repetition x2; delayed: 3/5 with cues: 2/2) Awareness: Appears intact Problem Solving: Impaired Problem Solving Impairment: Verbal complex (money: 3/3 time 1/1 both with additional processing time) Executive Function: Sequencing;Organizing Sequencing: Impaired Sequencing Impairment: Verbal complex (clock: 2/4) Organizing:   (backward digit span: 2/2 with additional processing time.)       Comprehension  Auditory Comprehension Overall Auditory Comprehension: Appears within functional limits for tasks assessed Yes/No Questions: Within Functional Limits Commands: Within Functional Limits Conversation: Complex    Expression Expression Primary Mode of Expression: Verbal Verbal Expression Overall Verbal Expression: Appears within functional limits for tasks assessed Initiation: No impairment Level of Generative/Spontaneous Verbalization: Conversation Repetition: No impairment Divergent:  (16 in one minute)   Oral / Motor  Oral Motor/Sensory Function Overall Oral Motor/Sensory Function: Within functional limits Motor Speech Overall Motor Speech: Appears within functional limits for tasks assessed Respiration: Within functional limits Phonation: Normal Resonance: Within functional limits Articulation: Within functional limitis Intelligibility: Intelligible Motor Planning: Witnin functional limits Motor Speech Errors: Not applicable           Zyona Pettaway I. Hardin Negus, [place], [place] Office number [phone]  Horton Marshall 10/24/2022, 11:40 AM

## 2022-10-25 ENCOUNTER — Telehealth: Payer: Self-pay

## 2022-10-25 NOTE — Telephone Encounter (Signed)
Transition Care Management Follow-up Telephone Call Date of discharge and from where: [facility]  How have you been since you were released from the hospital? tired Any questions or concerns? No  Items Reviewed: Did the pt receive and understand the discharge instructions provided? Yes  Medications obtained and verified? Yes  Other? No  Any new allergies since your discharge? no Dietary orders reviewed? Yes Do you have support at home? Yes   Home Care and Equipment/Supplies: Were home health services ordered? no If so, what is the name of the agency? N/a  Has the agency set up a time to come to the patient's home? not applicable Were any new equipment or medical supplies ordered?  No What is the name of the medical supply agency? N/a Were you able to get the supplies/equipment? no Do you have any questions related to the use of the equipment or supplies? No  Functional Questionnaire: (I = Independent and D = Dependent) ADLs: I  Bathing/Dressing- I  Meal Prep- D  Eating- I  Maintaining continence- I  Transferring/Ambulation- I  Managing Meds- I  Follow up appointments reviewed:  PCP Hospital f/u appt confirmed? No  Patient refused appt, states will call later [facility] f/u appt confirmed? No  Are transportation arrangements needed? No  If their condition worsens, is the pt aware to call PCP or go to the Emergency Dept.? Yes Was the patient provided with contact information for the PCP's office or ED? Yes Was to pt encouraged to call back with questions or concerns? Yes  Juanda Crumble, LPN [place] Direct Dial [phone]

## 2022-10-26 ENCOUNTER — Ambulatory Visit (INDEPENDENT_AMBULATORY_CARE_PROVIDER_SITE_OTHER): Payer: Medicare Other

## 2022-10-26 DIAGNOSIS — Z0000 Encounter for general adult medical examination without abnormal findings: Secondary | ICD-10-CM

## 2022-10-26 NOTE — Telephone Encounter (Signed)
Patient refused appt Heather [name], [place] Direct Dial [phone]

## 2022-10-26 NOTE — Patient Instructions (Addendum)
[name] , Thank you for taking time to come for your Medicare Wellness Visit. I appreciate your ongoing commitment to your health goals. Please review the following plan we discussed and let me know if I can assist you in the future.   These are the goals we discussed:  Goals      Patient Stated     Get strength to walk more and better         This is a list of the screening recommended for you and due dates:  Health Maintenance  Topic Date Due   Eye exam for diabetics  06/28/2019   Pap Smear  07/29/2022   Complete foot exam   08/20/2022   Zoster (Shingles) Vaccine (1 of 2) 01/12/2023*   Mammogram  02/26/2023*   Colon Cancer Screening  02/26/2023*   Hemoglobin A1C  04/24/2023   Tetanus Vaccine  09/21/2023   Medicare Annual Wellness Visit  10/27/2023   Flu Shot  Completed   Hepatitis C Screening: USPSTF Recommendation to screen - Ages 18-79 yo.  Completed   HIV Screening  Completed   HPV Vaccine  Aged Out   COVID-19 Vaccine  Discontinued  *Topic was postponed. The date shown is not the original due date.    Advanced directives: Advance directive discussed with you today. Even though you declined this today please call our office should you change your mind and we can give you the proper paperwork for you to fill out.   Conditions/risks identified: Get strength to walk more and better   Next appointment: Follow up in one year for your annual wellness visit.   Preventive Care 40-64 Years, Female Preventive care refers to lifestyle choices and visits with your health care provider that can promote health and wellness. What does preventive care include? A yearly physical exam. This is also called an annual well check. Dental exams once or twice a year. Routine eye exams. Ask your health care provider how often you should have your eyes checked. Personal lifestyle choices, including: Daily care of your teeth and gums. Regular physical activity. Eating a healthy  diet. Avoiding tobacco and drug use. Limiting alcohol use. Practicing safe sex. Taking low-dose aspirin daily starting at age 43. Taking vitamin and mineral supplements as recommended by your health care provider. What happens during an annual well check? The services and screenings done by your health care provider during your annual well check will depend on your age, overall health, lifestyle risk factors, and family history of disease. Counseling  Your health care provider may ask you questions about your: Alcohol use. Tobacco use. Drug use. Emotional well-being. Home and relationship well-being. Sexual activity. Eating habits. Work and work Statistician. Method of birth control. Menstrual cycle. Pregnancy history. Screening  You may have the following tests or measurements: Height, weight, and BMI. Blood pressure. Lipid and cholesterol levels. These may be checked every 5 years, or more frequently if you are over 31 years old. Skin check. Lung cancer screening. You may have this screening every year starting at age 55 if you have a 30-pack-year history of smoking and currently smoke or have quit within the past 15 years. Fecal occult blood test (FOBT) of the stool. You may have this test every year starting at age 39. Flexible sigmoidoscopy or colonoscopy. You may have a sigmoidoscopy every 5 years or a colonoscopy every 10 years starting at age 75. Hepatitis C blood test. Hepatitis B blood test. Sexually transmitted disease (STD) testing. Diabetes screening.  This is done by checking your blood sugar (glucose) after you have not eaten for a while (fasting). You may have this done every 1-3 years. Mammogram. This may be done every 1-2 years. Talk to your health care provider about when you should start having regular mammograms. This may depend on whether you have a family history of breast cancer. BRCA-related cancer screening. This may be done if you have a family history of  breast, ovarian, tubal, or peritoneal cancers. Pelvic exam and Pap test. This may be done every 3 years starting at age 62. Starting at age 6, this may be done every 5 years if you have a Pap test in combination with an HPV test. Bone density scan. This is done to screen for osteoporosis. You may have this scan if you are at high risk for osteoporosis. Discuss your test results, treatment options, and if necessary, the need for more tests with your health care provider. Vaccines  Your health care provider may recommend certain vaccines, such as: Influenza vaccine. This is recommended every year. Tetanus, diphtheria, and acellular pertussis (Tdap, Td) vaccine. You may need a Td booster every 10 years. Zoster vaccine. You may need this after age 4. Pneumococcal 13-valent conjugate (PCV13) vaccine. You may need this if you have certain conditions and were not previously vaccinated. Pneumococcal polysaccharide (PPSV23) vaccine. You may need one or two doses if you smoke cigarettes or if you have certain conditions. Talk to your health care provider about which screenings and vaccines you need and how often you need them. This information is not intended to replace advice given to you by your health care provider. Make sure you discuss any questions you have with your health care provider. Document Released: 01/09/2016 Document Revised: 09/01/2016 Document Reviewed: 10/14/2015 Elsevier Interactive Patient Education  2017 [place] Prevention in the Home Falls can cause injuries. They can happen to people of all ages. There are many things you can do to make your home safe and to help prevent falls. What can I do on the outside of my home? Regularly fix the edges of walkways and driveways and fix any cracks. Remove anything that might make you trip as you walk through a door, such as a raised step or threshold. Trim any bushes or trees on the path to your home. Use bright outdoor  lighting. Clear any walking paths of anything that might make someone trip, such as rocks or tools. Regularly check to see if handrails are loose or broken. Make sure that both sides of any steps have handrails. Any raised decks and porches should have guardrails on the edges. Have any leaves, snow, or ice cleared regularly. Use sand or salt on walking paths during winter. Clean up any spills in your garage right away. This includes oil or grease spills. What can I do in the bathroom? Use night lights. Install grab bars by the toilet and in the tub and shower. Do not use towel bars as grab bars. Use non-skid mats or decals in the tub or shower. If you need to sit down in the shower, use a plastic, non-slip stool. Keep the floor dry. Clean up any water that spills on the floor as soon as it happens. Remove soap buildup in the tub or shower regularly. Attach bath mats securely with double-sided non-slip rug tape. Do not have throw rugs and other things on the floor that can make you trip. What can I do in the bedroom?  Use night lights. Make sure that you have a light by your bed that is easy to reach. Do not use any sheets or blankets that are too big for your bed. They should not hang down onto the floor. Have a firm chair that has side arms. You can use this for support while you get dressed. Do not have throw rugs and other things on the floor that can make you trip. What can I do in the kitchen? Clean up any spills right away. Avoid walking on wet floors. Keep items that you use a lot in easy-to-reach places. If you need to reach something above you, use a strong step stool that has a grab bar. Keep electrical cords out of the way. Do not use floor polish or wax that makes floors slippery. If you must use wax, use non-skid floor wax. Do not have throw rugs and other things on the floor that can make you trip. What can I do with my stairs? Do not leave any items on the stairs. Make  sure that there are handrails on both sides of the stairs and use them. Fix handrails that are broken or loose. Make sure that handrails are as long as the stairways. Check any carpeting to make sure that it is firmly attached to the stairs. Fix any carpet that is loose or worn. Avoid having throw rugs at the top or bottom of the stairs. If you do have throw rugs, attach them to the floor with carpet tape. Make sure that you have a light switch at the top of the stairs and the bottom of the stairs. If you do not have them, ask someone to add them for you. What else can I do to help prevent falls? Wear shoes that: Do not have high heels. Have rubber bottoms. Are comfortable and fit you well. Are closed at the toe. Do not wear sandals. If you use a stepladder: Make sure that it is fully opened. Do not climb a closed stepladder. Make sure that both sides of the stepladder are locked into place. Ask someone to hold it for you, if possible. Clearly mark and make sure that you can see: Any grab bars or handrails. First and last steps. Where the edge of each step is. Use tools that help you move around (mobility aids) if they are needed. These include: Canes. Walkers. Scooters. Crutches. Turn on the lights when you go into a dark area. Replace any light bulbs as soon as they burn out. Set up your furniture so you have a clear path. Avoid moving your furniture around. If any of your floors are uneven, fix them. If there are any pets around you, be aware of where they are. Review your medicines with your doctor. Some medicines can make you feel dizzy. This can increase your chance of falling. Ask your doctor what other things that you can do to help prevent falls. This information is not intended to replace advice given to you by your health care provider. Make sure you discuss any questions you have with your health care provider. Document Released: 10/09/2009 Document Revised: 05/20/2016  Document Reviewed: 01/17/2015 Elsevier Interactive Patient Education  2017 [employer].

## 2022-10-26 NOTE — Progress Notes (Addendum)
I connected with  Heather [name] on 10/26/22 by a audio enabled telemedicine application and verified that I am speaking with the correct person using two identifiers.  Patient Location: Home  Provider Location: Office/Clinic  I discussed the limitations of evaluation and management by telemedicine. The patient expressed understanding and agreed to proceed.   Subjective:   Heather [name] is a 51 y.o. female who presents for an Initial Medicare Annual Wellness Visit.  Review of Systems     Cardiac Risk Factors include: advanced age (>29mn, >>29women);hypertension;dyslipidemia;diabetes mellitus;obesity (BMI >30kg/m2)     Objective:    There were no vitals filed for this visit. There is no height or weight on file to calculate BMI.     10/26/2022    1:39 PM 10/21/2022    6:18 PM 09/30/2022    9:56 AM 09/21/2022    6:22 PM 02/03/2022    9:56 AM 12/28/2021   11:35 PM 12/28/2021   11:09 AM  Advanced Directives  Does Patient Have a Medical Advance Directive? No No No No No No No  Does patient want to make changes to medical advance directive?       No - Patient declined  Would patient like information on creating a medical advance directive? No - Patient declined No - Patient declined No - Patient declined No - Patient declined  No - Patient declined No - Patient declined    Current Medications (verified) Outpatient Encounter Medications as of 10/26/2022  Medication Sig   amLODipine-olmesartan (AZOR) 5-20 MG tablet Take 1 tablet by mouth daily.   aspirin 81 MG chewable tablet Chew 1 tablet (81 mg total) by mouth daily.   azelastine (ASTELIN) 0.1 % nasal spray Place 2 sprays into both nostrils 2 (two) times daily.   calcium acetate (PHOSLO) 667 MG capsule Take 2,001 mg by mouth 3 (three) times daily with meals.   ceFEPIme 2 g in sodium chloride 0.9 % 100 mL Inject 2 g into the vein every Monday, Wednesday, and Friday at 8 PM.   clindamycin (CLEOCIN T) 1 % SWAB Apply 1  Application topically 3 (three) times a week.   Continuous Blood Gluc Sensor (FREESTYLE LIBRE 14 DAY SENSOR) MISC 1 patch by Does not apply route every 14 (fourteen) days.   Doxercalciferol (HECTOROL IV) Doxercalciferol (Hectorol)   fluticasone (FLONASE) 50 MCG/ACT nasal spray Place 2 sprays into both nostrils daily. (Patient taking differently: Place 2 sprays into both nostrils daily as needed for allergies.)   gabapentin (NEURONTIN) 300 MG capsule Take 1 capsule (300 mg total) by mouth every Monday, Wednesday, and Friday. At 10:30 am   insulin aspart (NOVOLOG FLEXPEN) 100 UNIT/ML FlexPen Inject 6 Units into the skin 3 (three) times daily with meals. Sliding scale   insulin degludec (TRESIBA FLEXTOUCH) 100 UNIT/ML FlexTouch Pen Inject 20 Units into the skin at bedtime. (Patient taking differently: Inject 26 Units into the skin at bedtime.)   Insulin Pen Needle 29G X 5MM MISC 1 Device by Does not apply route 4 (four) times daily.   ketoconazole (NIZORAL) 2 % cream Apply 1 Application topically 2 (two) times a week.   loratadine (CLARITIN) 10 MG tablet Take 10 mg by mouth daily.   Methoxy PEG-Epoetin Beta (MIRCERA IJ) Mircera   Olopatadine HCl (PATADAY) 0.2 % SOLN Apply 1 drop to eye daily. (Patient taking differently: Apply 1 drop to eye daily as needed (dry eyes).)   rosuvastatin (CRESTOR) 10 MG tablet Take 1 tablet (10 mg total) by  mouth daily.   vancomycin (VANCOCIN) 1-5 GM/200ML-% SOLN Inject 200 mLs (1,000 mg total) into the vein every Monday, Wednesday, and Friday with hemodialysis.   DIFLUCAN 100 MG tablet Take by mouth.   No facility-administered encounter medications on file as of 10/26/2022.    Allergies (verified) Ibuprofen, Dilaudid [hydromorphone hcl], Iodinated contrast media, and Tramadol   History: Past Medical History:  Diagnosis Date   Antiplatelet or antithrombotic long-term use: Plavix 06/30/2017   B12 deficiency 05/10/2014   Blood transfusion without reported diagnosis     Chronic constipation    CVA (cerebral vascular accident) ([place]), nonhemorrhagic, inferior right cerebellum 12/03/2017   left side weakness in leg   Cyst of left ovary 01/12/2018   Diabetic neuropathy, painful ([place]), on low dose Gabapentin 07/13/2013   DM (diabetes mellitus), type 2, uncontrolled, with renal complications 70/35/0093   DM2 (diabetes mellitus, type 2) ([place])    Dysfunction of left eustachian tube, with pusatile tinnitus 11/24/2017   Dyslipidemia associated with type 2 diabetes mellitus ([place]) 06/25/2016   ESRD (end stage renal disease) on dialysis [facility])    On hemodialysis in July 2019 via [facility] then switched to [facility] in Nov 2019.    ESRD with anemia (H[facility])    Fibromyalgia    Gastroparesis due to DM    GERD (gastroesophageal reflux disease)    Headache    Hypertension associated with diabetes ([place]) 02/19/2011   IBS (irritable bowel syndrome)    Left-sided weakness 12/10/2017   .   Leiomyoma of uterus    Pancreatitis    PE (pulmonary thromboembolism) ([place]) 11/04/2019   Pneumonia    Proliferative diabetic retinopathy ([place]) 09/29/2015   Pronation deformity of both feet 06/14/2014   Reactive depression 12/10/2017   .   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[place]) 02/17/2018   Thromboembolism ([place]) 04/05/2018   Upper airway cough syndrome, with recs to stay off ACE and take Pepcid q hs 11/15/2016   Past Surgical History:  Procedure Laterality Date   A/V FISTULAGRAM Right 03/03/2020   Procedure: Venogram;  Surgeon: Waynetta Sandy, MD;  Location: [place] [facility];  Service: Cardiovascular;  Laterality: Right;   ARTERY REPAIR Right 02/17/2018   Procedure: EXPLORATION OF RIGHT BRACHIAL ARTERY;  Surgeon: Conrad Maynard, MD;  Location: [place];  Service: Vascular;  Laterality: Right;   ARTERY REPAIR Right 02/17/2018   Procedure: BRACHIAL ARTERY EXPLORATION AND TRHOMBECTOMY;  Surgeon:  Conrad Weiner, MD;  Location: [place];  Service: Vascular;  Laterality: Right;   AV FISTULA PLACEMENT Left 05/09/2017   Procedure: INSERTION OF ARTERIOVENOUS (AV) GRAFT ARM (ARTEGRAFT);  Surgeon: Conrad Bradley, MD;  Location[facility] OR;  Service: Vascular;  Laterality: Left;   AV FISTULA PLACEMENT Right 02/17/2018   Procedure: INSERTION OF ARTERIOVENOUS (AV) GORE-TEX GRAFT ARM RIGHT UPPER ARM;  Surgeon: Conrad Olivet, MD;  Location: [place];  Service: Vascular;  Laterality: Right;   AV FISTULA PLACEMENT Left 10/10/2019   Procedure: INSERTION OF LEFT ARTERIOVENOUS (AV) ARTEGRAFT GRAFT ARM;  Surgeon: Waynetta Sandy, MD;  Location: [place];  Service: Vascular;  Laterality: Left;   AV FISTULA PLACEMENT Right 03/19/2020   Procedure: INSERTION OF ARTERIOVENOUS (AV) GORE-TEX GRAFT IN RIGHT ARM AND VIABAHN STENT IN RIGHT AXILLARY ARTERY;  Surgeon: Waynetta Sandy, MD;  Location: [place];  Service: Vascular;  Laterality: Right;   [place] Left 08/31/2016   Procedure: BASILIC  VEIN TRANSPOSITION FIRST STAGE;  Surgeon: Conrad Ontario, MD;  Location: [place];  Service: Vascular;  Laterality: Left;   BRONCHIAL WASHINGS  09/17/2021   Procedure: BRONCHIAL WASHINGS;  Surgeon: Collene Gobble, MD;  Location: [facility];  Service: Cardiopulmonary;;   CENTRAL VENOUS CATHETER INSERTION Left 07/24/2018   Procedure: INSERTION CENTRAL LINE ADULT;  Surgeon: Conrad Wortham, MD;  Location: [place];  Service: Vascular;  Laterality: Left;   EYE SURGERY     secondary to diabetic retinopathy    FOOT SURGERY Right    t"ook bone out- maybe hammer toe"   INSERTION OF DIALYSIS CATHETER Left 07/24/2018   Procedure: INSERTION OF TUNNELED DIALYSIS CATHETER;  Surgeon: Conrad Corning, MD;  Location: [place];  Service: Vascular;  Laterality: Left;   IR FLUORO GUIDE CV LINE LEFT  12/19/2021   IR FLUORO GUIDE CV LINE LEFT  12/29/2021   IR FLUORO GUIDE CV LINE RIGHT  07/21/2018   IR FLUORO GUIDE CV LINE RIGHT  06/01/2019   IR  FLUORO GUIDE CV LINE RIGHT  05/05/2020   IR FLUORO GUIDE CV LINE RIGHT  10/04/2022   IR REMOVAL TUN CV CATH W/O FL  01/23/2021   IR REMOVAL TUN CV CATH W/O FL  12/19/2021   IR REMOVAL TUN CV CATH W/O FL  10/01/2022   IR US GUIDE VASC ACCESS LEFT  12/19/2021   IR US GUIDE VASC ACCESS RIGHT  07/21/2018   IR US GUIDE VASC ACCESS RIGHT  06/01/2019   IR US GUIDE VASC ACCESS RIGHT  10/04/2022   LIGATION ARTERIOVENOUS GORTEX GRAFT Right 03/20/2020   Procedure: LIGATION ARM ARTERIOVENOUS GORTEX GRAFT With Patch Angioplasty, Thrombectomy;  Surgeon: Waynetta Sandy, MD;  Location: [place];  Service: Vascular;  Laterality: Right;   LIGATION OF ARTERIOVENOUS  FISTULA Left 05/09/2017   Procedure: LIGATION OF ARTERIOVENOUS  FISTULA;  Surgeon: Conrad Luxemburg, MD;  Location: [place];  Service: Vascular;  Laterality: Left;   LOOP RECORDER INSERTION N/A 12/05/2017   Procedure: LOOP RECORDER INSERTION;  Surgeon: Evans Lance, MD;  Location: [place] [facility];  Service: Cardiovascular;  Laterality: N/A;   MYOMECTOMY     REMOVAL OF GRAFT Right 02/17/2018   Procedure: REMOVAL OF RIGHT UPPER ARM ARTERIOVENOUS GRAFT;  Surgeon: Conrad Plainville, MD;  Location: [place];  Service: Vascular;  Laterality: Right;   REVISION OF ARTERIOVENOUS GORETEX GRAFT Left 07/24/2018   Procedure: REDO ARTERIOVENOUS GORETEX GRAFT;  Surgeon: Conrad [place], MD;  Location: [place];  Service: Vascular;  Laterality: Left;   TEE WITHOUT CARDIOVERSION N/A 12/05/2017   Procedure: TRANSESOPHAGEAL ECHOCARDIOGRAM (TEE);  Surgeon: Sanda Klein, MD;  Location: [place];  Service: Cardiovascular;  Laterality: N/A;   TEE WITHOUT CARDIOVERSION N/A 09/30/2022   Procedure: TRANSESOPHAGEAL ECHOCARDIOGRAM (TEE);  Surgeon: Jerline Pain, MD;  Location: [facility] ENDOSCOPY;  Service: Cardiovascular;  Laterality: N/A;   UPPER EXTREMITY VENOGRAPHY Left 07/13/2018   Procedure: UPPER EXTREMITY VENOGRAPHY - Central & Left Arm;  Surgeon: Conrad , MD;  Location: [place] [facility];  Service: Cardiovascular;  Laterality: Left;   UPPER EXTREMITY VENOGRAPHY Bilateral 09/10/2019   Procedure: UPPER EXTREMITY VENOGRAPHY;  Surgeon: Waynetta Sandy, MD;  Location: [place] [facility];  Service: Cardiovascular;  Laterality: Bilateral;   UTERINE FIBROID SURGERY     VIDEO BRONCHOSCOPY N/A 09/17/2021   Procedure: VIDEO BRONCHOSCOPY WITHOUT FLUORO;  Surgeon: Collene Gobble, MD;  [facility];  Service: Cardiopulmonary;  Laterality: N/A;  VITRECTOMY Bilateral    Family History  Problem Relation Age of Onset   Colon polyps Mother    Diabetes Mother    Heart murmur Father        history essentially unknown   Hypertension Father    Diabetes Sister    Kidney failure Sister        kidney transplant   Diabetes Brother    Retinal degeneration Brother    Heart murmur Son    Stroke Paternal Grandmother    Diabetes Sister    Social History   Socioeconomic History   Marital status: Married    Spouse name: Not on file   Number of children: 1   Years of education: college   Highest education level: Not on file  Occupational History   Occupation: customer service rep    Employer: [employer]  Tobacco Use   Smoking status: Never   Smokeless tobacco: Never  Vaping Use   Vaping Use: Never used  Substance and Sexual Activity   Alcohol use: No    Alcohol/week: 0.0 standard drinks of alcohol   Drug use: No   Sexual activity: Not Currently    Partners: Male    Birth control/protection: I.U.D.  Other Topics Concern   Not on file  Social History Narrative   Patient lives with fiance. She has 1 grown son. Works full-time, she Paramedic for attorney.   Caffeine Use: 2 cups daily; sodas occasionally   She has 7 grandchildren   Social Determinants of Health   Financial Resource Strain: Low Risk  (10/26/2022)   Overall Financial Resource Strain (CARDIA)    Difficulty of Paying Living Expenses: Not hard at all  Food Insecurity: No  Food Insecurity (10/26/2022)   Hunger Vital Sign    Worried About Running Out of Food in the Last Year: Never true    Ran Out of Food in the Last Year: Never true  Transportation Needs: No Transportation Needs (10/26/2022)   PRAPARE - Hydrologist (Medical): No    Lack of Transportation (Non-Medical): No  Physical Activity: Inactive (10/26/2022)   Exercise Vital Sign    Days of Exercise per Week: 0 days    Minutes of Exercise per Session: 0 min  Stress: No Stress Concern Present (10/26/2022)   Tamms    Feeling of Stress : Not at all  Social Connections: Moderately Integrated (10/26/2022)   Social Connection and Isolation Panel [NHANES]    Frequency of Communication with Friends and Family: More than three times a week    Frequency of Social Gatherings with Friends and Family: More than three times a week    Attends Religious Services: More than 4 times per year    Active Member of [employer] or Organizations: No    Attends Music therapist: Never    Marital Status: Married    Tobacco Counseling Counseling given: Not Answered   Clinical Intake:  Pre-visit preparation completed: Yes  Pain : No/denies pain     BMI - recorded: 33.52 Nutritional Status: BMI > 30  Obese Nutritional Risks: None Diabetes: Yes CBG done?: No Did pt. bring in CBG monitor from home?: No  How often do you need to have someone help you when you read instructions, pamphlets, or other written materials from your doctor or pharmacy?: 1 - Never  Diabetic?Nutrition Risk Assessment:  Has the patient had any N/V/D within the last 2  months?  No  Does the patient have any non-healing wounds?  No  Has the patient had any unintentional weight loss or weight gain?  No   Diabetes:  Is the patient diabetic?  Yes  If diabetic, was a CBG obtained today?  No  Did the patient bring in their glucometer  from home?  No  How often do you monitor your CBG's? Every other day .   Financial Strains and Diabetes Management:  Are you having any financial strains with the device, your supplies or your medication? No .  Does the patient want to be seen by Chronic Care Management for management of their diabetes?  No  Would the patient like to be referred to a Nutritionist or for Diabetic Management?  No   Diabetic Exams:  Diabetic Eye Exam: Completed per pt this year  Diabetic Foot Exam: Overdue, Pt has been advised about the importance in completing this exam. Pt is scheduled for diabetic foot exam on next appt .   Interpreter Needed?: No  Information entered by :: Charlott Rakes, LPN   Activities of Daily Living    10/26/2022    1:42 PM 09/30/2022    8:30 PM  In your present state of health, do you have any difficulty performing the following activities:  Hearing? 0 0  Vision? 0 0  Difficulty concentrating or making decisions? 0 0  Walking or climbing stairs? 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 0 0  Doing errands, shopping? 0 0  Preparing Food and eating ? N   Using the Toilet? N   In the past six months, have you accidently leaked urine? N   Do you have problems with loss of bowel control? N   Managing your Medications? N   Managing your Finances? N   Housekeeping or managing your Housekeeping? N     Patient Care Team: [name], MD as PCP - General (Family Medicine) Harl Bowie Royetta Crochet, MD as PCP - Cardiology (Cardiology) Gerarda Fraction, MD as Referring Physician (Ophthalmology) Louanne Belton, MD as Referring Physician (Cardiology) [place], [facility] [place], Melanie Crazier, MD as Consulting Physician (Endocrinology) Dwana Melena, MD as Attending Physician (Nephrology)  Indicate any recent Medical Services you may have received from other than [facility] providers in the past year (date may be approximate).     Assessment:    This is a routine wellness examination for [place].  Hearing/Vision screen Hearing Screening - Comments:: Pt denies any hearing issues  Vision Screening - Comments:: Pt follows up with Dr Cordelia Pen for annul eye exams   Dietary issues and exercise activities discussed: Current Exercise Habits: The patient does not participate in regular exercise at present   Goals Addressed             This Visit's Progress    Patient Stated       Get strength to walk more and better        Depression Screen    10/26/2022    1:40 PM 10/12/2022    8:51 AM 06/12/2021    2:41 PM 01/02/2018    7:50 AM 12/08/2017   11:40 AM 06/30/2017   10:10 AM 04/12/2017   10:07 AM  PHQ 2/9 Scores  PHQ - 2 Score 0 0 0 1 4 0 0  PHQ- 9 Score   0 9 17      Fall Risk    10/26/2022    1:42  PM 10/12/2022    8:51 AM 06/12/2021    2:40 PM 05/23/2019    9:52 AM 03/23/2019    3:43 PM  Fall Risk   Falls in the past year? 0  0 1 0  Number falls in past yr: 0 0 0 1 0  Injury with Fall? 0 0 0 1 0  Risk for fall due to : Impaired vision;Impaired mobility;Impaired balance/gait No Fall Risks No Fall Risks    Follow up Falls prevention discussed        FALL RISK PREVENTION PERTAINING TO THE HOME:  Any stairs in or around the home? Yes  If so, are there any without handrails? No  Home free of loose throw rugs in walkways, pet beds, electrical cords, etc? Yes  Adequate lighting in your home to reduce risk of falls? Yes   ASSISTIVE DEVICES UTILIZED TO PREVENT FALLS:  Life alert? Yes  Use of a cane, walker or w/c? Yes  Grab bars in the bathroom? No  Shower chair or bench in shower? Yes  Elevated toilet seat or a handicapped toilet? No   TIMED UP AND GO:  Was the test performed? No .   Cognitive Function:        10/26/2022    1:43 PM  6CIT Screen  What Year? 0 points  What month? 0 points  What time? 0 points  Count back from 20 0 points  Months in reverse 0 points  Repeat phrase 0 points  Total Score  0 points    Immunizations Immunization History  Administered Date(s) Administered   Influenza Inj Mdck Quad Pf 09/03/2016   Influenza Inj Mdck Quad With Preservative 09/03/2016   Influenza Split 03/27/2013, 09/03/2016, 09/28/2018   Influenza, Quadrivalent, Recombinant, Inj, Pf 10/20/2022   Influenza, Seasonal, Injecte, Preservative Fre 09/28/2018   Influenza,inj,Quad PF,6+ Mos 09/20/2013, 09/11/2014, 09/26/2016, 10/18/2019, 10/18/2019, 09/23/2020, 09/15/2021   Influenza,inj,quad, With Preservative 10/18/2019, 09/23/2020   Influenza-Unspecified 03/27/2013, 09/03/2016, 09/28/2018, 12/27/2021   Moderna Sars-Covid-2 Vaccination 08/24/2020, 09/21/2020   Pneumococcal Conjugate-13 12/24/2016   Pneumococcal Polysaccharide-23 04/26/2014, 09/11/2014, 03/15/2019   Tdap 09/20/2013    TDAP status: Up to date  Flu Vaccine status: Up to date    Covid-19 vaccine status: Completed vaccines  Qualifies for Shingles Vaccine? Yes   Zostavax completed No   Shingrix Completed?: No.    Education has been provided regarding the importance of this vaccine. Patient has been advised to call insurance company to determine out of pocket expense if they have not yet received this vaccine. Advised may also receive vaccine at local pharmacy or Health Dept. Verbalized acceptance and understanding.  Screening Tests Health Maintenance  Topic Date Due   OPHTHALMOLOGY EXAM  06/28/2019   PAP SMEAR-Modifier  07/29/2022   FOOT EXAM  08/20/2022   Zoster Vaccines- Shingrix (1 of 2) 01/12/2023 (Originally 12/07/2019)   MAMMOGRAM  02/26/2023 (Originally 08/10/2019)   COLONOSCOPY (Pts 45-23yr Insurance coverage will need to be confirmed)  02/26/2023 (Originally 05/19/2021)   HEMOGLOBIN A1C  04/24/2023   TETANUS/TDAP  09/21/2023   Medicare Annual Wellness (AWV)  10/27/2023   INFLUENZA VACCINE  Completed   Hepatitis C Screening  Completed   HIV Screening  Completed   HPV VACCINES  Aged Out   COVID-19 Vaccine   Discontinued    Health Maintenance  Health Maintenance Due  Topic Date Due   OPHTHALMOLOGY EXAM  06/28/2019   PAP SMEAR-Modifier  07/29/2022   FOOT EXAM  08/20/2022    Colorectal cancer screening: Type  of screening: Colonoscopy. Completed 05/19/18. Repeat every 3 years  Mammogram status: Completed 05/19/18. Repeat every year     Additional Screening:  Hepatitis C Screening:  Completed 10/01/22  Vision Screening: Recommended annual ophthalmology exams for early detection of glaucoma and other disorders of the eye. Is the patient up to date with their annual eye exam?  Yes  Who is the provider or what is the name of the office in which the patient attends annual eye exams? Dr Cordelia Pen  If pt is not established with a provider, would they like to be referred to a provider to establish care? No .   Dental Screening: Recommended annual dental exams for proper oral hygiene  Community Resource Referral / Chronic Care Management: CRR required this visit?  No   CCM required this visit?  No      Plan:     I have personally reviewed and noted the following in the patient's chart:   Medical and social history Use of alcohol, tobacco or illicit drugs  Current medications and supplements including opioid prescriptions. Patient is not currently taking opioid prescriptions. Functional ability and status Nutritional status Physical activity Advanced directives List of other physicians Hospitalizations, surgeries, and ER visits in previous 12 months Vitals Screenings to include cognitive, depression, and falls Referrals and appointments  In addition, I have reviewed and discussed with patient certain preventive protocols, quality metrics, and best practice recommendations. A written personalized care plan for preventive services as well as general preventive health recommendations were provided to patient.     Willette Brace, LPN   11/94/1740   Nurse Notes: none

## 2022-11-11 ENCOUNTER — Inpatient Hospital Stay: Payer: Medicare Other | Admitting: Infectious Disease

## 2022-11-30 NOTE — Progress Notes (Unsigned)
NEW PATIENT Date of Service/Encounter:  12/01/22 Referring provider: [name], MD Primary care provider: [name], MD  Subjective:  Heather [name] is a 51 y.o. female with a PMHx of DMII, HTN, gastroparesis associated with MD, hyperparathyroidism, CKD stage III, dyslipidemia, IBS, CVA, hx of cardiac arrest, hx of PE, mitral valve mass presenting today for evaluation of chronic cough History obtained from: chart review and patient.   Chronic cough:  Ongoing x 1 year. Intermittently dry and wet.  All day long, intermittently, wakes her up at night.  Occasional clear yellowish sputum production. She does feel like her food gets stuck in her throat and it causes her to cough really hard.  She does have gastroparesis. Will occasionally have shortness of breath only in the middle of coughing fits which can last for minutes at a time. She denies symptoms of heartburn, not taking anything for reflux. She does have a rescue inhaler which she has only used twice, and it didn't really seem to help. Follows with [facility].  Failed OTC antihistamines and pepcid.  Astelin started, pataday eye drops.  Chronic rhinitis:  She has never dealt with allergies when younger, but now is dealing with watery eyes and clouds her vision.  Does get annual diabetes eye screening.   She occasionally gets runny nose/congestion.  Currently year-round. She has used claritin daily but didn't find it helpful. Has not tried allergy eye drops. She has tried a nasal spray, but never used it.  She does have dry skin on right arm which is itchy. No prior history of eczema. Skin has became darker in this area and sometimes hurts due to itching.  She follows with cardiology and ID for mitral valve mass, is on dialysis for ESRD.  Hospitalized 10/21/22 for acute CVA LV pulmonary 07/02/22-bronchoscopy on 09/17/2021 to further evaluate micronodular tree-in-bud opacities in the bilateral upper lobes  on previous imaging. Her cultures, AFB and fungal smears were all negative  CXR 07/-7/23: No acute cardiopulmonary finding. No pleural effusion.  Chest CT 02/03/22: No pulmonary emboli. 2. Increased size of a moderate sized pericardial effusion. 3. Interval small to moderate-sized left pleural effusion and small right pleural effusion. 4. Interval bilateral atelectasis, most pronounced in the left lower lobe. 5. Mild calcific coronary artery and aortic atherosclerosis. 07/02/22 feno 15  Other allergy screening: Asthma: no Food allergy: no Medication allergy: no Hymenoptera allergy: no Urticaria: no Eczema:no Vaccinations are up to date.   Past Medical History: 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[place]), nonhemorrhagic, inferior right cerebellum 12/03/2017   left side weakness in leg   Cyst of left ovary 01/12/2018   Diabetic neuropathy, painful ([place]), on low dose Gabapentin 07/13/2013   DM (diabetes mellitus), type 2, uncontrolled, with renal complications 29/51/8841   DM2 (diabetes mellitus, type 2) ([place])    Dysfunction of left eustachian tube, with pusatile tinnitus 11/24/2017   Dyslipidemia associated with type 2 diabetes mellitus ([place]) 06/25/2016   ESRD (end stage renal disease) on dialysis [facility])    On hemodialysis in July 2019 via [facility] then switched to [facility] in Nov 2019.    ESRD with anemia (H[facility])    Fibromyalgia    Gastroparesis due to DM    GERD (gastroesophageal reflux disease)  Headache    Hypertension associated with diabetes ([place]) 02/19/2011   IBS (irritable bowel syndrome)    Left-sided weakness 12/10/2017   .   Leiomyoma of uterus    Pancreatitis    PE (pulmonary thromboembolism) ([place]) 11/04/2019   Pneumonia    Proliferative diabetic retinopathy  ([place]) 09/29/2015   Pronation deformity of both feet 06/14/2014   Reactive depression 12/10/2017   .   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[place]) 02/17/2018   Thromboembolism ([place]) 04/05/2018   Upper airway cough syndrome, with recs to stay off ACE and take Pepcid q hs 11/15/2016   Urticaria    Medication List:  Current Outpatient Medications  Medication Sig Dispense Refill   amLODipine-olmesartan (AZOR) 5-20 MG tablet Take 1 tablet by mouth daily. 90 tablet 0   aspirin 81 MG chewable tablet Chew 1 tablet (81 mg total) by mouth daily. 30 tablet 0   azelastine (ASTELIN) 0.1 % nasal spray Place 2 sprays into both nostrils 2 (two) times daily. 30 mL 12   calcium acetate (PHOSLO) 667 MG capsule Take 2,001 mg by mouth 3 (three) times daily with meals.     ceFEPIme 2 g in sodium chloride 0.9 % 100 mL Inject 2 g into the vein every Monday, Wednesday, and Friday at 8 PM.     clindamycin (CLEOCIN T) 1 % SWAB Apply 1 Application topically 3 (three) times a week.     Continuous Blood Gluc Sensor (FREESTYLE LIBRE 14 DAY SENSOR) MISC 1 patch by Does not apply route every 14 (fourteen) days. 6 each 5   DIFLUCAN 100 MG tablet Take by mouth.     Doxercalciferol (HECTOROL IV) Doxercalciferol (Hectorol)     gabapentin (NEURONTIN) 300 MG capsule Take 1 capsule (300 mg total) by mouth every Monday, Wednesday, and Friday. At 10:30 am 30 capsule 3   insulin aspart (NOVOLOG FLEXPEN) 100 UNIT/ML FlexPen Inject 6 Units into the skin 3 (three) times daily with meals. Sliding scale 15 mL    insulin degludec (TRESIBA FLEXTOUCH) 100 UNIT/ML FlexTouch Pen Inject 20 Units into the skin at bedtime. (Patient taking differently: Inject 26 Units into the skin at bedtime.) 24 mL 3   Insulin Pen Needle 29G X 5MM MISC 1 Device by Does not apply route 4 (four) times daily. 400 each 3   ketoconazole (NIZORAL) 2 % cream Apply 1  Application topically 2 (two) times a week.     loratadine (CLARITIN) 10 MG tablet Take 10 mg by mouth daily.     Methoxy PEG-Epoetin Beta (MIRCERA IJ) Mircera     Olopatadine HCl (PATADAY) 0.2 % SOLN Apply 1 drop to eye daily. (Patient taking differently: Apply 1 drop to eye daily as needed (dry eyes).) 2.5 mL 3   pantoprazole (PROTONIX) 40 MG tablet Take 1 tablet (40 mg total) by mouth daily. 30 tablet 3   rosuvastatin (CRESTOR) 10 MG tablet Take 1 tablet (10 mg total) by mouth daily. 30 tablet 0   triamcinolone ointment (KENALOG) 0.1 % Apply topically twice daily to BODY as needed for red, sandpaper like rash.  Do not use on face, groin or armpits. 80 g 1   vancomycin (VANCOCIN) 1-5 GM/200ML-% SOLN Inject 200 mLs (1,000 mg total) into the vein every Monday, Wednesday, and Friday with hemodialysis. 4000 mL    fluticasone (FLONASE) 50 MCG/ACT nasal spray Place 2 sprays into both nostrils daily. 18.2 mL 2  No current facility-administered medications for this visit.   Known Allergies:  Allergies  Allergen Reactions   Ibuprofen Other (See Comments)    CKD stage 3. Should avoid.   Dilaudid [Hydromorphone Hcl] Itching and Other (See Comments)    Can take with Benadryl.   Iodinated Contrast Media Rash   Tramadol Itching   Past Surgical History: Past Surgical History:  Procedure Laterality Date   A/V FISTULAGRAM Right 03/03/2020   Procedure: Venogram;  Surgeon: Waynetta Sandy, MD;  Location: [place] [facility];  Service: Cardiovascular;  Laterality: Right;   ARTERY REPAIR Right 02/17/2018   Procedure: EXPLORATION OF RIGHT BRACHIAL ARTERY;  Surgeon: Conrad Middletown, MD;  Location: [place];  Service: Vascular;  Laterality: Right;   ARTERY REPAIR Right 02/17/2018   Procedure: BRACHIAL ARTERY EXPLORATION AND TRHOMBECTOMY;  Surgeon: Conrad [place], MD;  Location: [place];  Service: Vascular;  Laterality: Right;   AV FISTULA PLACEMENT Left 05/09/2017   Procedure: INSERTION OF ARTERIOVENOUS (AV)  GRAFT ARM (ARTEGRAFT);  Surgeon: Conrad Westfield, MD;  Location: [place];  Service: Vascular;  Laterality: Left;   AV FISTULA PLACEMENT Right 02/17/2018   Procedure: INSERTION OF ARTERIOVENOUS (AV) GORE-TEX GRAFT ARM RIGHT UPPER ARM;  Surgeon: Conrad Franklin, MD;  Location: [place];  Service: Vascular;  Laterality: Right;   AV FISTULA PLACEMENT Left 10/10/2019   Procedure: INSERTION OF LEFT ARTERIOVENOUS (AV) ARTEGRAFT GRAFT ARM;  Surgeon: Waynetta Sandy, MD;  Location: [place];  Service: Vascular;  Laterality: Left;   AV FISTULA PLACEMENT Right 03/19/2020   Procedure: INSERTION OF ARTERIOVENOUS (AV) GORE-TEX GRAFT IN RIGHT ARM AND VIABAHN STENT IN RIGHT AXILLARY ARTERY;  Surgeon: Waynetta Sandy, MD;  Location: [place];  Service: Vascular;  Laterality: Right;   Waves Left 08/31/2016   Procedure: BASILIC VEIN TRANSPOSITION FIRST STAGE;  Surgeon: Conrad Labette, MD;  Location: [place];  Service: Vascular;  Laterality: Left;   BRONCHIAL WASHINGS  09/17/2021   Procedure: BRONCHIAL WASHINGS;  Surgeon: Collene Gobble, MD;  Location: [facility];  Service: Cardiopulmonary;;   CENTRAL VENOUS CATHETER INSERTION Left 07/24/2018   Procedure: INSERTION CENTRAL LINE ADULT;  Surgeon: Conrad Akron, MD;  Location: [facility];  Service: Vascular;  Laterality: Left;   EYE SURGERY     secondary to diabetic retinopathy    FOOT SURGERY Right    t"ook bone out- maybe hammer toe"   INSERTION OF DIALYSIS CATHETER Left 07/24/2018   Procedure: INSERTION OF TUNNELED DIALYSIS CATHETER;  Surgeon: Conrad Boykin, MD;  Location: [place];  Service: Vascular;  Laterality: Left;   IR FLUORO GUIDE CV LINE LEFT  12/19/2021   IR FLUORO GUIDE CV LINE LEFT  12/29/2021   IR FLUORO GUIDE CV LINE RIGHT  07/21/2018   IR FLUORO GUIDE CV LINE RIGHT  06/01/2019   IR FLUORO GUIDE CV LINE RIGHT  05/05/2020   IR FLUORO GUIDE CV LINE RIGHT  10/04/2022   IR REMOVAL TUN CV CATH W/O FL  01/23/2021   IR REMOVAL TUN CV CATH W/O FL   12/19/2021   IR REMOVAL TUN CV CATH W/O FL  10/01/2022   IR US GUIDE VASC ACCESS LEFT  12/19/2021   IR US GUIDE VASC ACCESS RIGHT  07/21/2018   IR US GUIDE VASC ACCESS RIGHT  06/01/2019   IR US GUIDE VASC ACCESS RIGHT  10/04/2022   LIGATION ARTERIOVENOUS GORTEX GRAFT Right 03/20/2020   Procedure: LIGATION ARM ARTERIOVENOUS GORTEX GRAFT With Patch  Angioplasty, Thrombectomy;  Surgeon: Waynetta Sandy, MD;  Location: [place];  Service: Vascular;  Laterality: Right;   LIGATION OF ARTERIOVENOUS  FISTULA Left 05/09/2017   Procedure: LIGATION OF ARTERIOVENOUS  FISTULA;  Surgeon: Conrad Volcano, MD;  Location: [place];  Service: Vascular;  Laterality: Left;   LOOP RECORDER INSERTION N/A 12/05/2017   Procedure: LOOP RECORDER INSERTION;  Surgeon: Evans Lance, MD;  Location: [place] [facility];  Service: Cardiovascular;  Laterality: N/A;   MYOMECTOMY     REMOVAL OF GRAFT Right 02/17/2018   Procedure: REMOVAL OF RIGHT UPPER ARM ARTERIOVENOUS GRAFT;  Surgeon: Conrad Aptos, MD;  Location: [place];  Service: Vascular;  Laterality: Right;   REVISION OF ARTERIOVENOUS GORETEX GRAFT Left 07/24/2018   Procedure: REDO ARTERIOVENOUS GORETEX GRAFT;  Surgeon: Conrad Adelphi, MD;  Location: [place];  Service: Vascular;  Laterality: Left;   TEE WITHOUT CARDIOVERSION N/A 12/05/2017   Procedure: TRANSESOPHAGEAL ECHOCARDIOGRAM (TEE);  Surgeon: Sanda Klein, MD;  Location: [place];  Service: Cardiovascular;  Laterality: N/A;   TEE WITHOUT CARDIOVERSION N/A 09/30/2022   Procedure: TRANSESOPHAGEAL ECHOCARDIOGRAM (TEE);  Surgeon: Jerline Pain, MD;  Location: [facility];  Service: Cardiovascular;  Laterality: N/A;   UPPER EXTREMITY VENOGRAPHY Left 07/13/2018   Procedure: UPPER EXTREMITY VENOGRAPHY - Central & Left Arm;  Surgeon: Conrad Orion, MD;  Location: [place] [facility];  Service: Cardiovascular;  Laterality: Left;   UPPER EXTREMITY VENOGRAPHY Bilateral 09/10/2019   Procedure: UPPER EXTREMITY VENOGRAPHY;  Surgeon:  Waynetta Sandy, MD;  Location: [place] [facility];  Service: Cardiovascular;  Laterality: Bilateral;   UTERINE FIBROID SURGERY     VIDEO BRONCHOSCOPY N/A 09/17/2021   Procedure: VIDEO BRONCHOSCOPY WITHOUT FLUORO;  Surgeon: Collene Gobble, MD;  Location: [facility];  Service: Cardiopulmonary;  Laterality: N/A;   VITRECTOMY Bilateral    Family History: Family History  Problem Relation Age of Onset   Colon polyps Mother    Diabetes Mother    Heart murmur Father        history essentially unknown   Hypertension Father    Diabetes Sister    Kidney failure Sister        kidney transplant   Diabetes Brother    Retinal degeneration Brother    Heart murmur Son    Stroke Paternal Grandmother    Diabetes Sister    Social History: Madinah lives in a house without water damage, wood floors, electric heating, central AC, outdoor cats, no cockroaches, not using dust mite protection on the bedding and pillows, no smoke exposure.  No HEPA filter in the home.  [place]   ROS:  All other systems negative except as noted per HPI.  Objective:  Blood pressure (!) 150/76, pulse 89, temperature 98.3 F (36.8 C), resp. rate 16, height '5\' 9"'$  (1.753 m), weight 227 lb (103 kg), last menstrual period 09/10/2021, SpO2 98 %. Body mass index is 33.52 kg/m. Physical Exam:  General Appearance:  Alert, cooperative, no distress, appears stated age  Head:  Normocephalic, without obvious abnormality, atraumatic  Eyes:  Conjunctiva clear, EOM's intact  Nose: Nares normal,  nasal crease present, hypertrophic turbinates, normal mucosa, and no visible anterior polyps  Throat: Lips, tongue normal; teeth and gums normal, normal posterior oropharynx  Neck: Supple, symmetrical  Lungs:   clear to auscultation bilaterally, Respirations unlabored, intermittent dry coughing  Heart:  regular rate and rhythm and no murmur, Appears well perfused  Extremities: No  edema  Skin:  Skin color, texture, turgor normal, no rashes or lesions on visualized portions of skin     Diagnostics:  Skin Testing: Deferred due to time constraints, will return next week for testing  Assessment and Plan  Chronic cough:  Discussed likely multifactorial.  Given her history and exam, suspect upper airway cough secondary possibly secondary to reflux/dysphagia as well as postnasal drip which could be related to allergies.  We will undergo allergy testing next week.  For now plan as below:  - this can be secondary to multiple different causes, in your case, suspect upper airway causes of reflux, possible trouble swallowing, and drainage which could be secondary to allergies -Next week we will undergo allergy testing - for now, start prantoprazole 40 mg daily prior to meals - stat flonase 2 sprays each nostril once daily-has at home - referral to GI due to sensation of food getting stuck in throat  Do not take antihistamines prior to next week.  Atopic Dermatitis:  Daily Care For Maintenance (daily and continue even once eczema controlled) - Use hypoallergenic hydrating ointment at least twice daily.  This must be done daily for control of flares. (Great options include Vaseline, CeraVe, Aquaphor, Aveeno, Cetaphil, VaniCream, etc) - Avoid detergents, soaps or lotions with fragrances/dyes - Limit showers/baths to 5 minutes and use luke warm water instead of hot, pat dry following baths, and apply moisturizer - can use steroid/non-steroid therapy creams as detailed below up to twice weekly for prevention of flares.  For Flares:(add this to maintenance therapy if needed for flares) First apply steroid/non-steroid treatment creams. Wait 5 minutes then apply moisturizer.   - Triamcinolone 0.1% to body for moderate flares-apply topically twice daily to red, raised areas of skin, followed by moisturizer. Do NOT use on face, groin or armpits.   Return Wednesday, December 13 at 10 AM for  allergy testing (1 through 38)  This note in its entirety was forwarded to the Provider who requested this consultation.  Thank you for your kind referral. I appreciate the opportunity to take part in Yalexa's care. Please do not hesitate to contact me with questions.  Sincerely,  Sigurd Sos, MD [facility]

## 2022-12-01 ENCOUNTER — Other Ambulatory Visit: Payer: Self-pay

## 2022-12-01 ENCOUNTER — Ambulatory Visit (INDEPENDENT_AMBULATORY_CARE_PROVIDER_SITE_OTHER): Payer: Medicare Other | Admitting: Internal Medicine

## 2022-12-01 ENCOUNTER — Encounter: Payer: Self-pay | Admitting: Internal Medicine

## 2022-12-01 VITALS — BP 150/76 | HR 89 | Temp 98.3°F | Resp 16 | Ht 69.0 in | Wt 227.0 lb

## 2022-12-01 DIAGNOSIS — R1310 Dysphagia, unspecified: Secondary | ICD-10-CM | POA: Diagnosis not present

## 2022-12-01 DIAGNOSIS — J3089 Other allergic rhinitis: Secondary | ICD-10-CM

## 2022-12-01 DIAGNOSIS — R053 Chronic cough: Secondary | ICD-10-CM | POA: Diagnosis not present

## 2022-12-01 MED ORDER — TRIAMCINOLONE ACETONIDE 0.1 % EX OINT: TOPICAL_OINTMENT | CUTANEOUS | 1 refills | Status: DC

## 2022-12-01 MED ORDER — FLUTICASONE PROPIONATE 50 MCG/ACT NA SUSP: 2.0000 | Freq: Every day | NASAL | 2 refills | Status: DC

## 2022-12-01 MED ORDER — PANTOPRAZOLE SODIUM 40 MG PO TBEC: 40.0000 mg | DELAYED_RELEASE_TABLET | Freq: Every day | ORAL | 3 refills | Status: DC

## 2022-12-01 NOTE — Patient Instructions (Addendum)
Chronic cough:  - this can be secondary to multiple different causes, in your case, suspect upper airway causes of reflux, possible trouble swallowing, and drainage which could be secondary to allergies -Next week we will undergo allergy testing - for now, start prantoprazole 40 mg daily prior to meals - stat flonase 2 sprays each nostril once daily - referral to GI due to sensation of food getting stuck in throat  Do not take antihistamines prior to next week.  Atopic Dermatitis:  Daily Care For Maintenance (daily and continue even once eczema controlled) - Use hypoallergenic hydrating ointment at least twice daily.  This must be done daily for control of flares. (Great options include Vaseline, CeraVe, Aquaphor, Aveeno, Cetaphil, VaniCream, etc) - Avoid detergents, soaps or lotions with fragrances/dyes - Limit showers/baths to 5 minutes and use luke warm water instead of hot, pat dry following baths, and apply moisturizer - can use steroid/non-steroid therapy creams as detailed below up to twice weekly for prevention of flares.  For Flares:(add this to maintenance therapy if needed for flares) First apply steroid/non-steroid treatment creams. Wait 5 minutes then apply moisturizer.   - Triamcinolone 0.1% to body for moderate flares-apply topically twice daily to red, raised areas of skin, followed by moisturizer. Do NOT use on face, groin or armpits.   Return Wednesday, December 13 at 10 AM for allergy testing (1 through 74)

## 2022-12-07 NOTE — Progress Notes (Unsigned)
Date of Service/Encounter:  12/08/22  Allergy testing appointment   Initial visit on 12/01/22, seen for chronic conjunctivitis and rhinitis, chronic cough managed by [place] allergy, dysphagia, dry skin.  Please see that note for additional details.  Today reports for allergy diagnostic testing:    DIAGNOSTICS:  Skin Testing: Environmental allergy panel and select foods. Adequate positive and negative controls Results discussed with patient/family.  Airborne Adult Perc - 12/08/22 1000     Allergen Manufacturer Lavella Hammock    Location Back    Number of Test 59    1. Control-Buffer 50% Glycerol Negative    2. Control-Histamine 1 mg/ml 3+    3. Albumin saline Negative    4. [place] Negative    5. [place] Negative    6. Johnson Negative    7. Fairburn Blue Negative    8. Meadow Fescue Negative    9. Perennial Rye Negative    10. Sweet Vernal Negative    11. Timothy Negative    12. Cocklebur Negative    13. Burweed Marshelder Negative    14. Ragweed, short Negative    15. Ragweed, Giant Negative    16. Plantain,  English Negative    17. Lamb's Quarters Negative    18. Sheep Sorrell Negative    19. Rough Pigweed Negative    20. Marsh Elder, Rough Negative    21. Mugwort, Common Negative    22. Ash mix Negative    23. Birch mix Negative    24. Beech American Negative    25. Box, Elder Negative    26. Cedar, red Negative    27. Cottonwood, [place] Negative    28. Elm mix Negative    29. Hickory Negative    30. Maple mix Negative    31. Oak, [place] mix Negative    32. Pecan Pollen Negative    33. Pine mix Negative    34. Sycamore Eastern Negative    35. [place], Black Pollen Negative    36. Alternaria alternata Negative    37. Cladosporium Herbarum Negative    38. Aspergillus mix Negative    39. Penicillium mix Negative    40. Bipolaris sorokiniana (Helminthosporium) Negative    41. Drechslera spicifera (Curvularia) Negative    42. Mucor plumbeus Negative    43. Fusarium  moniliforme Negative    44. Aureobasidium pullulans (pullulara) Negative    45. Rhizopus oryzae Negative    46. Botrytis cinera Negative    47. Epicoccum nigrum Negative    48. Phoma betae Negative    49. Candida Albicans Negative    50. Trichophyton mentagrophytes Negative    51. Mite, D Farinae  5,000 AU/ml Negative    52. Mite, D Pteronyssinus  5,000 AU/ml Negative    53. Cat Hair 10,000 BAU/ml Negative    54.  Dog Epithelia Negative    55. Mixed Feathers Negative    56. Horse Epithelia Negative    57. Cockroach, German Negative    58. Mouse Negative    59. Tobacco Leaf Negative             Food Perc - 12/08/22 1000       Test Information   Allergen Manufacturer [place]    Location Back    Number of allergen test 10      Food   1. Peanut Negative    2. Soybean food Negative    3. Wheat, whole Negative    4. Sesame Negative    5. Milk, cow  Negative    6. Egg White, chicken Negative    7. Casein Negative    8. Shellfish mix Negative    9. Fish mix Negative    10. Cashew Negative             Intradermal - 12/08/22 1029     Time Antigen Placed 1034    Allergen Manufacturer Lavella Hammock    Location Arm    Number of Test 15    Control Negative    Guatem[place] Negative    Johnson Negative    7 Grass Negative    Ragweed mix Negative    Weed mix Negative    Tree mix Negative    Mold 1 Negative    Mold 2 Negative    Mold 3 Negative    Mold 4 Negative    Cat Negative    Dog Negative    Cockroach Negative    Mite mix Negative             Allergy testing results were read and interpreted by myself, documented by clinical staff.  Patient provided with copy of allergy testing along with avoidance measures when indicated.   Sigurd Sos, MD  [facility] [place]   Chronic cough:  - allergy testing today: negative to environmental and top 9 most common food allergens on skin testing, intradermal testing also negative - this can be  secondary to multiple different causes, in your case, suspect upper airway causes of reflux, possible trouble swallowing, and drainage - continue pantoprazole 40 mg daily prior to meals - continue flonase 2 sprays each nostril once daily - pending referral to GI-let us know if  you do not have an appointment set-up within the next 2 weeks  Rash on arnm:  Daily Care For Maintenance (daily and continue even once eczema controlled) - Use hypoallergenic hydrating ointment at least twice daily.  This must be done daily for control of flares. (Great options include Vaseline, CeraVe, Aquaphor, Aveeno, Cetaphil, VaniCream, etc) - Avoid detergents, soaps or lotions with fragrances/dyes - Limit showers/baths to 5 minutes and use luke warm water instead of hot, pat dry following baths, and apply moisturizer - can use steroid/non-steroid therapy creams as detailed below up to twice weekly for prevention of flares.  For Flares:(add this to maintenance therapy if needed for flares) First apply steroid/non-steroid treatment creams. Wait 5 minutes then apply moisturizer.   - Triamcinolone 0.1% to body for moderate flares-apply topically twice daily to red, raised areas of skin, followed by moisturizer. Do NOT use on face, groin or armpits.   Follow-up as needed It was a pleasure meeting you in clinic today!

## 2022-12-08 ENCOUNTER — Encounter: Payer: Self-pay | Admitting: Internal Medicine

## 2022-12-08 ENCOUNTER — Ambulatory Visit (INDEPENDENT_AMBULATORY_CARE_PROVIDER_SITE_OTHER): Payer: Medicare Other | Admitting: Internal Medicine

## 2022-12-08 VITALS — BP 138/78 | HR 87 | Temp 97.7°F | Resp 12

## 2022-12-08 DIAGNOSIS — R053 Chronic cough: Secondary | ICD-10-CM

## 2022-12-08 DIAGNOSIS — J3089 Other allergic rhinitis: Secondary | ICD-10-CM

## 2022-12-08 MED ORDER — CROMOLYN SODIUM 4 % OP SOLN: 1.0000 [drp] | Freq: Four times a day (QID) | OPHTHALMIC | 3 refills | Status: DC | PRN

## 2022-12-08 NOTE — Patient Instructions (Signed)
Chronic cough:  - allergy testing today: negative to environmental and top 9 most common food allergens on skin testing, intradermal testing also negative - this can be secondary to multiple different causes, in your case, suspect upper airway causes of reflux, possible trouble swallowing, and drainage - continue pantoprazole 40 mg daily prior to meals - continue flonase 2 sprays each nostril once daily - pending referral to GI-let us know if  you do not have an appointment set-up within the next 2 weeks  Rash on arnm:  Daily Care For Maintenance (daily and continue even once eczema controlled) - Use hypoallergenic hydrating ointment at least twice daily.  This must be done daily for control of flares. (Great options include Vaseline, CeraVe, Aquaphor, Aveeno, Cetaphil, VaniCream, etc) - Avoid detergents, soaps or lotions with fragrances/dyes - Limit showers/baths to 5 minutes and use luke warm water instead of hot, pat dry following baths, and apply moisturizer - can use steroid/non-steroid therapy creams as detailed below up to twice weekly for prevention of flares.  For Flares:(add this to maintenance therapy if needed for flares) First apply steroid/non-steroid treatment creams. Wait 5 minutes then apply moisturizer.   - Triamcinolone 0.1% to body for moderate flares-apply topically twice daily to red, raised areas of skin, followed by moisturizer. Do NOT use on face, groin or armpits.   Follow-up as needed It was a pleasure meeting you in clinic today! Thank you for allowing me to participate in your care.  Sigurd Sos, MD [facility]   Airborne Adult Perc - 12/08/22 1000     Allergen Manufacturer Lavella Hammock    Location Back    Number of Test 59    1. Control-Buffer 50% Glycerol Negative    2. Control-Histamine 1 mg/ml 3+    3. Albumin saline Negative    4. [place] Negative    5. [place] Negative    6. Johnson Negative    7. [place] Blue Negative    8. Meadow  Fescue Negative    9. Perennial Rye Negative    10. Sweet Vernal Negative    11. Timothy Negative    12. Cocklebur Negative    13. Burweed Marshelder Negative    14. Ragweed, short Negative    15. Ragweed, Giant Negative    16. Plantain,  English Negative    17. Lamb's Quarters Negative    18. Sheep Sorrell Negative    19. Rough Pigweed Negative    20. Marsh Elder, Rough Negative    21. Mugwort, Common Negative    22. Ash mix Negative    23. Birch mix Negative    24. Beech American Negative    25. Box, Elder Negative    26. Cedar, red Negative    27. Cottonwood, [place] Negative    28. Elm mix Negative    29. Hickory Negative    30. Maple mix Negative    31. Oak, [place] mix Negative    32. Pecan Pollen Negative    33. Pine mix Negative    34. Sycamore Eastern Negative    35. [place], Black Pollen Negative    36. Alternaria alternata Negative    37. Cladosporium Herbarum Negative    38. Aspergillus mix Negative    39. Penicillium mix Negative    40. Bipolaris sorokiniana (Helminthosporium) Negative    41. Drechslera spicifera (Curvularia) Negative    42. Mucor plumbeus Negative    43. Fusarium moniliforme Negative    44. Aureobasidium pullulans (  pullulara) Negative    45. Rhizopus oryzae Negative    46. Botrytis cinera Negative    47. Epicoccum nigrum Negative    48. Phoma betae Negative    49. Candida Albicans Negative    50. Trichophyton mentagrophytes Negative    51. Mite, D Farinae  5,000 AU/ml Negative    52. Mite, D Pteronyssinus  5,000 AU/ml Negative    53. Cat Hair 10,000 BAU/ml Negative    54.  Dog Epithelia Negative    55. Mixed Feathers Negative    56. Horse Epithelia Negative    57. Cockroach, German Negative    58. Mouse Negative    59. Tobacco Leaf Negative             Food Perc - 12/08/22 1000       Test Information   Allergen Manufacturer Greer    Location Back    Number of allergen test 10      Food   1. Peanut Negative    2. Soybean  food Negative    3. Wheat, whole Negative    4. Sesame Negative    5. Milk, cow Negative    6. Egg White, chicken Negative    7. Casein Negative    8. Shellfish mix Negative    9. Fish mix Negative    10. Cashew Negative             Intradermal - 12/08/22 1029     Time Antigen Placed 1034    Allergen Manufacturer Lavella Hammock    Location Arm    Number of Test 15    Control Negative    Guatem[place] Negative    Johnson Negative    7 Grass Negative    Ragweed mix Negative    Weed mix Negative    Tree mix Negative    Mold 1 Negative    Mold 2 Negative    Mold 3 Negative    Mold 4 Negative    Cat Negative    Dog Negative    Cockroach Negative    Mite mix Negative

## 2022-12-09 ENCOUNTER — Ambulatory Visit: Payer: Medicare Other | Admitting: Internal Medicine

## 2022-12-12 ENCOUNTER — Encounter (HOSPITAL_COMMUNITY): Payer: Self-pay | Admitting: Emergency Medicine

## 2022-12-12 ENCOUNTER — Emergency Department (HOSPITAL_COMMUNITY): Payer: Medicare Other

## 2022-12-12 ENCOUNTER — Other Ambulatory Visit: Payer: Self-pay

## 2022-12-12 ENCOUNTER — Inpatient Hospital Stay (HOSPITAL_COMMUNITY)
Admission: EM | Admit: 2022-12-12 | Discharge: 2022-12-24 | DRG: 064 | Disposition: A | Discharge status: Skilled Nursing Facility | Payer: Medicare Other | Attending: Internal Medicine | Admitting: Internal Medicine

## 2022-12-12 DIAGNOSIS — Z833 Family history of diabetes mellitus: Secondary | ICD-10-CM

## 2022-12-12 DIAGNOSIS — E785 Hyperlipidemia, unspecified: Secondary | ICD-10-CM | POA: Diagnosis present

## 2022-12-12 DIAGNOSIS — Z8673 Personal history of transient ischemic attack (TIA), and cerebral infarction without residual deficits: Secondary | ICD-10-CM

## 2022-12-12 DIAGNOSIS — N186 End stage renal disease: Secondary | ICD-10-CM

## 2022-12-12 DIAGNOSIS — Z79899 Other long term (current) drug therapy: Secondary | ICD-10-CM

## 2022-12-12 DIAGNOSIS — Z6832 Body mass index (BMI) 32.0-32.9, adult: Secondary | ICD-10-CM

## 2022-12-12 DIAGNOSIS — Z86711 Personal history of pulmonary embolism: Secondary | ICD-10-CM

## 2022-12-12 DIAGNOSIS — M797 Fibromyalgia: Secondary | ICD-10-CM | POA: Diagnosis present

## 2022-12-12 DIAGNOSIS — E0590 Thyrotoxicosis, unspecified without thyrotoxic crisis or storm: Secondary | ICD-10-CM | POA: Diagnosis present

## 2022-12-12 DIAGNOSIS — I739 Peripheral vascular disease, unspecified: Secondary | ICD-10-CM

## 2022-12-12 DIAGNOSIS — D696 Thrombocytopenia, unspecified: Secondary | ICD-10-CM | POA: Diagnosis present

## 2022-12-12 DIAGNOSIS — Z992 Dependence on renal dialysis: Secondary | ICD-10-CM

## 2022-12-12 DIAGNOSIS — Z8674 Personal history of sudden cardiac arrest: Secondary | ICD-10-CM

## 2022-12-12 DIAGNOSIS — I959 Hypotension, unspecified: Secondary | ICD-10-CM | POA: Diagnosis present

## 2022-12-12 DIAGNOSIS — G9340 Encephalopathy, unspecified: Secondary | ICD-10-CM | POA: Diagnosis not present

## 2022-12-12 DIAGNOSIS — N2581 Secondary hyperparathyroidism of renal origin: Secondary | ICD-10-CM | POA: Diagnosis present

## 2022-12-12 DIAGNOSIS — Z515 Encounter for palliative care: Secondary | ICD-10-CM

## 2022-12-12 DIAGNOSIS — Z993 Dependence on wheelchair: Secondary | ICD-10-CM

## 2022-12-12 DIAGNOSIS — R4701 Aphasia: Secondary | ICD-10-CM | POA: Diagnosis not present

## 2022-12-12 DIAGNOSIS — I63412 Cerebral infarction due to embolism of left middle cerebral artery: Principal | ICD-10-CM | POA: Diagnosis present

## 2022-12-12 DIAGNOSIS — I152 Hypertension secondary to endocrine disorders: Secondary | ICD-10-CM | POA: Diagnosis present

## 2022-12-12 DIAGNOSIS — Z841 Family history of disorders of kidney and ureter: Secondary | ICD-10-CM

## 2022-12-12 DIAGNOSIS — Z7189 Other specified counseling: Secondary | ICD-10-CM

## 2022-12-12 DIAGNOSIS — Z89511 Acquired absence of right leg below knee: Secondary | ICD-10-CM

## 2022-12-12 DIAGNOSIS — E1159 Type 2 diabetes mellitus with other circulatory complications: Secondary | ICD-10-CM

## 2022-12-12 DIAGNOSIS — E1143 Type 2 diabetes mellitus with diabetic autonomic (poly)neuropathy: Secondary | ICD-10-CM | POA: Diagnosis present

## 2022-12-12 DIAGNOSIS — H532 Diplopia: Secondary | ICD-10-CM | POA: Diagnosis present

## 2022-12-12 DIAGNOSIS — R29710 NIHSS score 10: Secondary | ICD-10-CM | POA: Diagnosis present

## 2022-12-12 DIAGNOSIS — R5381 Other malaise: Secondary | ICD-10-CM | POA: Diagnosis present

## 2022-12-12 DIAGNOSIS — G9389 Other specified disorders of brain: Secondary | ICD-10-CM | POA: Diagnosis present

## 2022-12-12 DIAGNOSIS — Z7982 Long term (current) use of aspirin: Secondary | ICD-10-CM

## 2022-12-12 DIAGNOSIS — I639 Cerebral infarction, unspecified: Secondary | ICD-10-CM | POA: Diagnosis present

## 2022-12-12 DIAGNOSIS — Z8249 Family history of ischemic heart disease and other diseases of the circulatory system: Secondary | ICD-10-CM

## 2022-12-12 DIAGNOSIS — Z91041 Radiographic dye allergy status: Secondary | ICD-10-CM

## 2022-12-12 DIAGNOSIS — E1122 Type 2 diabetes mellitus with diabetic chronic kidney disease: Secondary | ICD-10-CM | POA: Diagnosis present

## 2022-12-12 DIAGNOSIS — E119 Type 2 diabetes mellitus without complications: Secondary | ICD-10-CM

## 2022-12-12 DIAGNOSIS — Z888 Allergy status to other drugs, medicaments and biological substances status: Secondary | ICD-10-CM

## 2022-12-12 DIAGNOSIS — D631 Anemia in chronic kidney disease: Secondary | ICD-10-CM | POA: Diagnosis present

## 2022-12-12 DIAGNOSIS — E860 Dehydration: Secondary | ICD-10-CM | POA: Diagnosis present

## 2022-12-12 DIAGNOSIS — E1151 Type 2 diabetes mellitus with diabetic peripheral angiopathy without gangrene: Secondary | ICD-10-CM | POA: Diagnosis present

## 2022-12-12 DIAGNOSIS — D4989 Neoplasm of unspecified behavior of other specified sites: Secondary | ICD-10-CM | POA: Diagnosis present

## 2022-12-12 DIAGNOSIS — I5189 Other ill-defined heart diseases: Secondary | ICD-10-CM

## 2022-12-12 DIAGNOSIS — Z794 Long term (current) use of insulin: Secondary | ICD-10-CM

## 2022-12-12 DIAGNOSIS — Z823 Family history of stroke: Secondary | ICD-10-CM

## 2022-12-12 DIAGNOSIS — E669 Obesity, unspecified: Secondary | ICD-10-CM | POA: Diagnosis present

## 2022-12-12 DIAGNOSIS — E1165 Type 2 diabetes mellitus with hyperglycemia: Secondary | ICD-10-CM | POA: Diagnosis present

## 2022-12-12 DIAGNOSIS — G9341 Metabolic encephalopathy: Secondary | ICD-10-CM | POA: Diagnosis not present

## 2022-12-12 DIAGNOSIS — E871 Hypo-osmolality and hyponatremia: Secondary | ICD-10-CM | POA: Diagnosis present

## 2022-12-12 DIAGNOSIS — Z83719 Family history of colon polyps, unspecified: Secondary | ICD-10-CM

## 2022-12-12 DIAGNOSIS — E1169 Type 2 diabetes mellitus with other specified complication: Secondary | ICD-10-CM | POA: Diagnosis present

## 2022-12-12 DIAGNOSIS — Z1152 Encounter for screening for COVID-19: Secondary | ICD-10-CM

## 2022-12-12 DIAGNOSIS — Z885 Allergy status to narcotic agent status: Secondary | ICD-10-CM

## 2022-12-12 LAB — I-STAT CHEM 8, ED
BUN: 40 mg/dL — ABNORMAL HIGH (ref 6–20)
Calcium, Ion: 1.06 mmol/L — ABNORMAL LOW (ref 1.15–1.40)
Chloride: 101 mmol/L (ref 98–111)
Creatinine, Ser: 10.9 mg/dL — ABNORMAL HIGH (ref 0.44–1.00)
Glucose, Bld: 191 mg/dL — ABNORMAL HIGH (ref 70–99)
HCT: 39 % (ref 36.0–46.0)
Hemoglobin: 13.3 g/dL (ref 12.0–15.0)
Potassium: 4.3 mmol/L (ref 3.5–5.1)
Sodium: 137 mmol/L (ref 135–145)
TCO2: 28 mmol/L (ref 22–32)

## 2022-12-12 LAB — AMMONIA: Ammonia: 24 umol/L (ref 9–35)

## 2022-12-12 LAB — COMPREHENSIVE METABOLIC PANEL
ALT: 61 U/L — ABNORMAL HIGH (ref 0–44)
AST: 24 U/L (ref 15–41)
Albumin: 3.4 g/dL — ABNORMAL LOW (ref 3.5–5.0)
Alkaline Phosphatase: 108 U/L (ref 38–126)
Anion gap: 12 (ref 5–15)
BUN: 39 mg/dL — ABNORMAL HIGH (ref 6–20)
CO2: 27 mmol/L (ref 22–32)
Calcium: 9.5 mg/dL (ref 8.9–10.3)
Chloride: 98 mmol/L (ref 98–111)
Creatinine, Ser: 10.31 mg/dL — ABNORMAL HIGH (ref 0.44–1.00)
GFR, Estimated: 4 mL/min — ABNORMAL LOW (ref >60)
Glucose, Bld: 187 mg/dL — ABNORMAL HIGH (ref 70–99)
Potassium: 3.9 mmol/L (ref 3.5–5.1)
Sodium: 137 mmol/L (ref 135–145)
Total Bilirubin: 0.4 mg/dL (ref 0.3–1.2)
Total Protein: 7.9 g/dL (ref 6.5–8.1)

## 2022-12-12 LAB — RESP PANEL BY RT-PCR (RSV, FLU A&B, COVID)  RVPGX2
Influenza A by PCR: NEGATIVE
Influenza B by PCR: NEGATIVE
Resp Syncytial Virus by PCR: NEGATIVE
SARS Coronavirus 2 by RT PCR: NEGATIVE

## 2022-12-12 LAB — CBC WITH DIFFERENTIAL/PLATELET
Abs Immature Granulocytes: 0.03 10*3/uL (ref 0.00–0.07)
Basophils Absolute: 0 10*3/uL (ref 0.0–0.1)
Basophils Relative: 0 %
Eosinophils Absolute: 0.5 10*3/uL (ref 0.0–0.5)
Eosinophils Relative: 7 %
HCT: 34.2 % — ABNORMAL LOW (ref 36.0–46.0)
Hemoglobin: 10.4 g/dL — ABNORMAL LOW (ref 12.0–15.0)
Immature Granulocytes: 1 %
Lymphocytes Relative: 23 %
Lymphs Abs: 1.5 10*3/uL (ref 0.7–4.0)
MCH: 29.7 pg (ref 26.0–34.0)
MCHC: 30.4 g/dL (ref 30.0–36.0)
MCV: 97.7 fL (ref 80.0–100.0)
Monocytes Absolute: 0.5 10*3/uL (ref 0.1–1.0)
Monocytes Relative: 7 %
Neutro Abs: 4.1 10*3/uL (ref 1.7–7.7)
Neutrophils Relative %: 62 %
Platelets: 133 10*3/uL — ABNORMAL LOW (ref 150–400)
RBC: 3.5 MIL/uL — ABNORMAL LOW (ref 3.87–5.11)
RDW: 15 % (ref 11.5–15.5)
WBC: 6.6 10*3/uL (ref 4.0–10.5)
nRBC: 0 % (ref 0.0–0.2)

## 2022-12-12 LAB — TROPONIN I (HIGH SENSITIVITY)
Troponin I (High Sensitivity): 23 ng/L — ABNORMAL HIGH (ref <18)
Troponin I (High Sensitivity): 20 ng/L — ABNORMAL HIGH (ref <18)

## 2022-12-12 LAB — CBG MONITORING, ED: Glucose-Capillary: 197 mg/dL — ABNORMAL HIGH (ref 70–99)

## 2022-12-12 MED ORDER — ACETAMINOPHEN 160 MG/5ML PO SOLN: 650.0000 mg | ORAL | Status: DC | PRN

## 2022-12-12 MED ORDER — LABETALOL HCL 5 MG/ML IV SOLN
10.0000 mg | Freq: Once | INTRAVENOUS | Status: AC
  Administered 2022-12-12: 10 mg via INTRAVENOUS
  Filled 2022-12-12: qty 4

## 2022-12-12 MED ORDER — STROKE: EARLY STAGES OF RECOVERY BOOK
Freq: Once | Status: AC
  Administered 2022-12-13: 1
  Filled 2022-12-12: qty 1

## 2022-12-12 MED ORDER — ACETAMINOPHEN 650 MG RE SUPP: 650.0000 mg | RECTAL | Status: DC | PRN

## 2022-12-12 MED ORDER — INSULIN ASPART 100 UNIT/ML IJ SOLN
0.0000 [IU] | Freq: Three times a day (TID) | INTRAMUSCULAR | Status: DC
  Administered 2022-12-13: 3 [IU] via SUBCUTANEOUS
  Administered 2022-12-14: 2 [IU] via SUBCUTANEOUS
  Administered 2022-12-15: 3 [IU] via SUBCUTANEOUS
  Administered 2022-12-17 – 2022-12-18 (×3): 2 [IU] via SUBCUTANEOUS
  Administered 2022-12-18 (×2): 3 [IU] via SUBCUTANEOUS
  Administered 2022-12-18: 2 [IU] via SUBCUTANEOUS
  Administered 2022-12-19 (×2): 3 [IU] via SUBCUTANEOUS
  Administered 2022-12-19: 5 [IU] via SUBCUTANEOUS
  Administered 2022-12-19 – 2022-12-20 (×3): 3 [IU] via SUBCUTANEOUS
  Administered 2022-12-20: 5 [IU] via SUBCUTANEOUS
  Administered 2022-12-21 – 2022-12-22 (×5): 3 [IU] via SUBCUTANEOUS
  Administered 2022-12-22: 5 [IU] via SUBCUTANEOUS
  Administered 2022-12-22 – 2022-12-23 (×2): 3 [IU] via SUBCUTANEOUS
  Administered 2022-12-23 – 2022-12-24 (×3): 2 [IU] via SUBCUTANEOUS

## 2022-12-12 MED ORDER — HEPARIN SODIUM (PORCINE) 5000 UNIT/ML IJ SOLN
5000.0000 [IU] | Freq: Three times a day (TID) | INTRAMUSCULAR | Status: DC
  Administered 2022-12-12 – 2022-12-23 (×30): 5000 [IU] via SUBCUTANEOUS
  Filled 2022-12-12 (×32): qty 1

## 2022-12-12 MED ORDER — LABETALOL HCL 5 MG/ML IV SOLN
10.0000 mg | INTRAVENOUS | Status: DC | PRN
  Administered 2022-12-13 – 2022-12-14 (×2): 10 mg via INTRAVENOUS
  Filled 2022-12-12 (×2): qty 4

## 2022-12-12 MED ORDER — ONDANSETRON HCL 4 MG/2ML IJ SOLN
4.0000 mg | Freq: Once | INTRAMUSCULAR | Status: AC
  Administered 2022-12-12: 4 mg via INTRAVENOUS
  Filled 2022-12-12: qty 2

## 2022-12-12 MED ORDER — ACETAMINOPHEN 325 MG PO TABS
650.0000 mg | ORAL_TABLET | ORAL | Status: DC | PRN
  Administered 2022-12-18 – 2022-12-22 (×4): 650 mg via ORAL
  Filled 2022-12-12 (×4): qty 2

## 2022-12-12 NOTE — ED Notes (Signed)
Pt reports she is a MWF dialysis pt and has not missed any tx

## 2022-12-12 NOTE — Assessment & Plan Note (Addendum)
-  allowing for permissive HTN with stroke. She was initially given IV '10mg'$  Labetalol by ED physician prior to the results of her MRI due to concerns for hypertensive encephalopathy with presenting SBP of 250

## 2022-12-12 NOTE — Assessment & Plan Note (Signed)
-   Hemoglobin A1c on 09/2022 of 7.4 -Moderate SSI

## 2022-12-12 NOTE — Assessment & Plan Note (Signed)
-  presents with encephalopathy and aphasia; she is nonverbal, unable to follow commands and has failed SLP eval - MRI Brain: 2 foci of restricted diffusion within the subcortical white matter of the right occipital pole consistent with acute/subacute nonhemorrhagic infarcts. 9 mm posterior left cerebellar infarct is new since the prior MRI, but not acute. Expected evolution of nonhemorrhagic infarct in the medial left frontal parietal cortex. Moderate generalized atrophy and advanced white matter disease likely reflects the sequela of chronic microvascular ischemia. - severe deficits and recurrent CVA; underlying cardiac mass still seen on repeat echo and likely still not a surgical candidate still in setting of multiple comorbidities and poor functional status worse now than in May 2023 when seen by CTS - palliative care consulted to help guide [place] discussions; patient better suited for consideration for hospice at this time - low benefit to PEG and would only prolong suffering and poor QOL; would also highly consider discontinuation of HD as well in this context

## 2022-12-12 NOTE — ED Notes (Signed)
Patient transported to MRI 

## 2022-12-12 NOTE — ED Notes (Signed)
[facility] verbally ordered by Dr. Billy Fischer, paged by Kalman Shan

## 2022-12-12 NOTE — Assessment & Plan Note (Addendum)
S/p right BKA. Wheelchair bound.

## 2022-12-12 NOTE — Assessment & Plan Note (Signed)
-  Hgb stable around her baseline

## 2022-12-12 NOTE — ED Provider Notes (Signed)
[facility] Provider Note   CSN: [id number] Arrival date & time: 12/12/22  1256     History  No chief complaint on file.   Heather [name] is a 51 y.o. female.  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  Husband reports he went out with some friends but she stayed back because she was having muscle cramps in her leg.  Reports she took some baclofen '10mg'$  that he reports was prescribed for her. (?) He spoke to her around midnight about his night and she seemed ok, then around 3AM he found her not wearing underwear in the living room, seemed confused, sleepy.  This morning she has seemed out of it, confused, and has not been speaking.   She has had dialysis regularly including Friday, and has been taking her blood pressure medications.     Home Medications Prior to Admission medications   Medication Sig Start Date End Date Taking? Authorizing Provider  amLODipine-olmesartan (AZOR) 5-20 MG tablet Take 1 tablet by mouth daily. 10/26/22   Thurnell Lose, MD  aspirin 81 MG chewable tablet Chew 1 tablet (81 mg total) by mouth daily. 10/24/22   Thurnell Lose, MD  azelastine (ASTELIN) 0.1 % nasal spray Place 2 sprays into both nostrils 2 (two) times daily. 10/12/22   [name], MD  calcium acetate (PHOSLO) 667 MG capsule Take 2,001 mg by mouth 3 (three) times daily with meals. 08/25/21   [provider]  ceFEPIme 2 g in sodium chloride 0.9 % 100 mL Inject 2 g into the vein every Monday, Wednesday, and Friday at 8 PM. 10/06/22   Ghimire, Henreitta Leber, MD  clindamycin (CLEOCIN T) 1 % SWAB Apply 1 Application topically 3 (three) times a week. 09/22/22   [provider]  Continuous  Blood Gluc Sensor (FREESTYLE LIBRE 14 DAY SENSOR) MISC 1 patch by Does not apply route every 14 (fourteen) days. 02/25/22   [name], MD  cromolyn (OPTICROM) 4 % ophthalmic solution Place 1 drop into both eyes 4 (four) times daily as needed. 12/08/22   Clemon Chambers, MD  DIFLUCAN 100 MG tablet Take by mouth. 10/20/22   [provider]  Doxercalciferol (HECTOROL IV) Doxercalciferol (Hectorol) 05/07/22 05/06/23  [provider]  fluticasone (FLONASE) 50 MCG/ACT nasal spray Place 2 sprays into both nostrils daily. 12/01/22   Clemon Chambers, MD  gabapentin (NEURONTIN) 300 MG capsule Take 1 capsule (300 mg total) by mouth every Monday, Wednesday, and Friday. At 10:30 am 10/13/22   [name], MD  insulin aspart (NOVOLOG FLEXPEN) 100 UNIT/ML FlexPen Inject 6 Units into the skin 3 (three) times daily with meals. Sliding scale 02/25/22   [name], MD  insulin degludec (TRESIBA FLEXTOUCH) 100 UNIT/ML FlexTouch Pen Inject 20 Units into the skin at bedtime. Patient taking differently: Inject 26 Units into the skin at bedtime. 10/05/22   Ghimire, Henreitta Leber, MD  Insulin Pen Needle 29G X 5MM MISC 1 Device by Does not apply route 4 (four) times daily. 12/26/19   Shamleffer, Melanie Crazier, MD  ketoconazole (NIZORAL) 2 % cream Apply 1 Application topically 2 (two) times a week. 09/22/22   [provider]  loratadine (CLARITIN) 10 MG  tablet Take 10 mg by mouth daily. 09/30/22   [provider]  Methoxy PEG-Epoetin Beta (MIRCERA IJ) Mircera 03/03/22 03/02/23  [provider]  Olopatadine HCl (PATADAY) 0.2 % SOLN Apply 1 drop to eye daily. Patient taking differently: Apply 1 drop to eye daily as needed (dry eyes). 10/12/22   [name], MD  pantoprazole (PROTONIX) 40 MG tablet Take 1 tablet (40 mg total) by mouth daily. 12/01/22   Clemon Chambers, MD  rosuvastatin (CRESTOR) 10 MG tablet Take 1 tablet (10 mg total) by mouth daily. 10/25/22   Thurnell Lose, MD   triamcinolone ointment (KENALOG) 0.1 % Apply topically twice daily to BODY as needed for red, sandpaper like rash.  Do not use on face, groin or armpits. 12/01/22   Clemon Chambers, MD  vancomycin [facility]) 1-5 GM/200ML-% SOLN Inject 200 mLs (1,000 mg total) into the vein every Monday, Wednesday, and Friday with hemodialysis. 10/06/22   Ghimire, Henreitta Leber, MD      Allergies    Ibuprofen, Dilaudid [hydromorphone hcl], Iodinated contrast media, and Tramadol    Review of Systems   Review of Systems  Physical Exam Updated Vital Signs BP (!) 175/107   Pulse 80   Temp 98.2 F (36.8 C)   Resp 10   LMP 09/10/2021   SpO2 94%  Physical Exam Vitals and nursing note reviewed.  Constitutional:      General: [name] not in acute distress.    Appearance: [name] well-developed. [name] obese. [name] ill-appearing. [name] not diaphoretic.  HENT:     Head: Normocephalic and atraumatic.  Eyes:     Conjunctiva/sclera: Conjunctivae normal.  Cardiovascular:     Rate and Rhythm: Normal rate and regular rhythm.     Heart sounds: Normal heart sounds. No murmur heard.    No friction rub. No gallop.  Pulmonary:     Effort: Pulmonary effort is normal. No respiratory distress.     Breath sounds: Normal breath sounds. No wheezing or rales.  Abdominal:     General: There is no distension.     Palpations: Abdomen is soft.     Tenderness: There is no abdominal tenderness. There is no guarding.  Musculoskeletal:        General: No tenderness.     Cervical back: Normal range of motion.     Comments: Bka right  Skin:    General: Skin is warm and dry.     Findings: No erythema or rash.  Neurological:     Mental Status: [name] alert.     Comments: Sleepy, does not answer questions verbally, does follow commands but inconsistently, strength appears equal bilateral UE and LE     ED Results / Procedures / Treatments   Labs (all labs ordered are listed, but only abnormal results are displayed) Labs  Reviewed  CBC WITH DIFFERENTIAL/PLATELET - Abnormal; Notable for the following components:      Result Value   RBC 3.50 (*)    Hemoglobin 10.4 (*)    HCT 34.2 (*)    Platelets 133 (*)    All other components within normal limits  CBG MONITORING, ED - Abnormal; Notable for the following components:   Glucose-Capillary 197 (*)    All other components within normal limits  I-STAT CHEM 8, ED - Abnormal; Notable for the following components:   BUN 40 (*)    Creatinine, Ser 10.90 (*)    Glucose, Bld 191 (*)  Calcium, Ion 1.06 (*)    All other components within normal limits  RESP PANEL BY RT-PCR (RSV, FLU A&B, COVID)  RVPGX2  AMMONIA  COMPREHENSIVE METABOLIC PANEL  TROPONIN I (HIGH SENSITIVITY)  TROPONIN I (HIGH SENSITIVITY)    EKG EKG Interpretation  Date/Time:  Sunday December 12 2022 13:16:42 EST Ventricular Rate:  92 PR Interval:  142 QRS Duration: 80 QT Interval:  386 QTC Calculation: 477 R Axis:   0 Text Interpretation: Normal sinus rhythm Cannot rule out Anterior infarct , age undetermined Abnormal ECG When compared with ECG of 22-Oct-2022 04:11, PREVIOUS ECG IS PRESENT No significant change since last tracing Confirmed by Isla Pence [phone]) on 12/12/2022 3:29:07 PM  Radiology DG Chest Portable 1 View  Result Date: 12/12/2022 CLINICAL DATA:  Altered mental status. EXAM: PORTABLE CHEST 1 VIEW COMPARISON:  Chest radiograph dated October 21, 2022 FINDINGS: The heart is enlarged. Mild pulmonary vascular congestion. Right IJ access dialysis catheter with distal tip in the right atrium, unchanged. Right axillary vascular stent is also unchanged. Low lung volumes without evidence of focal consolidation or pleural effusion. Thoracic spondylosis. IMPRESSION: 1. Cardiomegaly with mild pulmonary vascular congestion. 2. Low lung volumes without evidence of focal consolidation or pleural effusion. Electronically Signed   By: Keane Police D.O.   On: 12/12/2022 15:14   CT Head Wo  Contrast  Result Date: 12/12/2022 CLINICAL DATA:  Altered mental status. Nontraumatic. Patient went to bed last night around midnight normally, woke up this a.m. at 3 and was not able to talk EXAM: CT HEAD WITHOUT CONTRAST TECHNIQUE: Contiguous axial images were obtained from the base of the skull through the vertex without intravenous contrast. RADIATION DOSE REDUCTION: This exam was performed according to the departmental dose-optimization program which includes automated exposure control, adjustment of the mA and/or kV according to patient size and/or use of iterative reconstruction technique. COMPARISON:  CT examination dated October 26, 223 FINDINGS: Brain: No evidence of acute infarction, hemorrhage, hydrocephalus, extra-axial collection or mass lesion/mass effect. Low attenuation in the left paramedian frontoparietal region suggesting recent infarct. 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 Diffuse low-attenuation of the periventricular white matter presumed chronic microvascular ischemic changes. Vascular: No hyperdense vessel or unexpected calcification. Skull: Normal. Negative for fracture or focal lesion. Sinuses/Orbits: No acute finding. Other: None. IMPRESSION: 1. No acute intracranial hemorrhage, mass effect or midline shift. MRI examination for further evaluation is suggested if clinically warranted. 2. Low attenuation in the left paramedian frontoparietal region suggesting recent infarct, similar to prior examination. 3. 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 4. Advanced chronic microvascular ischemic changes of the white matter, unchanged. Electronically Signed   By: Keane Police D.O.   On: 12/12/2022 14:18    Procedures .Critical Care  Performed by: Gareth Morgan, MD Authorized by: Gareth Morgan, MD   Critical care provider statement:    Critical care time (minutes):  30   Critical care was time  spent personally by me on the following activities:  Development of treatment plan with patient or surrogate, discussions with consultants, examination of patient, ordering and review of laboratory studies, ordering and review of radiographic studies, ordering and performing treatments and interventions, pulse oximetry and review of old charts     Medications Ordered in ED Medications  labetalol (NORMODYNE) injection 10 mg (10 mg Intravenous Given 12/12/22 1511)  ondansetron (ZOFRAN) injection 4 mg (4 mg Intravenous Given 12/12/22 1516)    ED Course/ Medical Decision Making/ A&P  51yo female with history of ESRD on HD MWF, DM2, right BKA 2019, DVT and PE on eliquis, hospitalization 10/5-10/10 for 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  Spoke to Neurology regarding timing of presentation, no focal weakness to suggest LVO and out of code stroke window. Presentation does seem more consistent with an encephalopathy however CVA with aphasia is on the differential given her risk factors.  Discussed with neurology and will pursue blood pressure control given higher suspicion for encephalopathy.  CT head completed and personally evaluated by me without ICH.   Possible encephalopathy secondary to baclofen, hypertensive encephalopathy, uremia, other ingestion/polypharmacy, hepatic encephalopathy, other metabolic encephalopathy.  Labs pending at time of transfer of care with exception of istat which does not show significant abnormalities.         Final Clinical Impression(s) / ED Diagnoses Final diagnoses:  Encephalopathy  Aphasia    Rx / DC Orders ED Discharge Orders     None         Gareth Morgan, MD 12/12/22 1620

## 2022-12-12 NOTE — Assessment & Plan Note (Addendum)
-  HD MWF. No missed dialysis. -Nephrology following during hospitalization for ongoing HD while we continue [place] discussions

## 2022-12-12 NOTE — ED Triage Notes (Addendum)
Patient accompanied by husband, he states that patient went to bed last night around midnight normally, woke up this am at 3 and was not able to talk, patient is verbally repetitive upon questioning. Patient able to follow commands. Husband stating she was acting dazed and confused.   Patient is a dialysis patient MFW, chest port for access.   Hx of mini strokes recently.

## 2022-12-12 NOTE — ED Provider Notes (Signed)
Pt signed out by Dr. Billy Fischer pending labs and MRI  Covid/flu/rsv neg  Cbc with hgb 10.4  Cmp with glucose 187, bun 39 and cr 10.31 (all chronic)  MRI: 1. 2 foci of restricted diffusion within the subcortical white  matter of the right occipital pole consistent with acute/subacute  nonhemorrhagic infarcts.  2. 9 mm posterior left cerebellar infarct is new since the prior  MRI, but not acute  3. Expected evolution of nonhemorrhagic infarct in the medial left  frontal parietal cortex.  4. Moderate generalized atrophy and advanced white matter disease  likely reflects the sequela of chronic microvascular ischemia.    These results were called by telephone at the time of interpretation  on 12/12/2022 at 7:55 pm to provider [facility] , who verbally  acknowledged these results.    Pt is speaking a little better now, but is still not acting normally.  CVA would not explain the speech difficulty.    Pt d/w Dr. Flossie Buffy (triad) for admission.  I spoke to Dr. Rory Percy (neurology) who will see pt in consult.    CRITICAL CARE Performed by: Isla Pence   Total critical care time: 30 minutes  Critical care time was exclusive of separately billable procedures and treating other patients.  Critical care was necessary to treat or prevent imminent or life-threatening deterioration.  Critical care was time spent personally by me on the following activities: development of treatment plan with patient and/or surrogate as well as nursing, discussions with consultants, evaluation of patient's response to treatment, examination of patient, obtaining history from patient or surrogate, ordering and performing treatments and interventions, ordering and review of laboratory studies, ordering and review of radiographic studies, pulse oximetry and re-evaluation of patient's condition.    Isla Pence, MD 12/12/22 2053

## 2022-12-12 NOTE — ED Notes (Signed)
Upon walking into pt room, pt is now crying and talking. Pt also grabbing her L foot

## 2022-12-12 NOTE — H&P (Addendum)
History and Physical    Patient: Heather [name] EYC:[id number] DOB: [birth year] DOA: 12/12/2022 DOS: the patient was seen and examined on 12/12/2022 PCP: [name], MD  Patient coming from: Home  Chief Complaint:  Chief Complaint  Patient presents with   Altered Mental Status   HPI: Heather [name] is a 51 y.o. female with medical history significant of Hx of cardiac arrest, CVA, ESRD HD MWF, insulin-dependent T2DM with gastroparesis, PE/DVT on Eliquis, HTN, s/p BKA who presents with AMS, aphasia.   History obtained from husband at bedside since pt was lethargic and aphasic. She went to bed fine last evening and then around 3am husband found her out on the wheelchair saying she was ready for church. He then got her into bed and she had difficult transferring herself from her wheelchair which is abnormal for her. Then this morning she continued to appear confuse and weak. Also had an episode of vomiting. She did take '5mg'$  of Baclofen twice with last dose around 3pm yesterday. Has not taken this in a while but was having leg spasms. Pt takes her own medications and husband does not think she has missed aspirin. No tobacco, alcohol or illicit drug use.   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 At that time she had been off her Eliquis and this was not resumed by neurology due to increase risk of hemorrhagic conversion in the setting of endocarditis related embolic stroke. She was placed instead on aspirin.    In the ED, she was afebrile, BP of 254/124 on room air.   No leukocytosis.  Stable anemia with hemoglobin of 10.4.  No significant electrolyte abnormalities.  Creatinine of 10.9 around her  baseline.  Troponin mildly elevated at 23 and down trended to 20.  EKG on my review and normal sinus rhythm with left axis deviation.  CT head shows low-attenuation in the left paramedial frontal parietal region suggesting recent infarct.   MRI of the brain shows 2 foci of restricted diffusion in the right occipital pole consistent with acute/subacute nonhemorrhagic infarct.  There is a new 9 mm posterior left cerebral infarct that is new from prior MRI but not acute.   Review of Systems: unable to review all systems due to the inability of the patient to answer questions. 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[place]), nonhemorrhagic, inferior right cerebellum 12/03/2017   left side weakness in leg   Cyst of left ovary 01/12/2018   Diabetic neuropathy, painful ([place]), on low dose Gabapentin 07/13/2013   DM (diabetes mellitus), type 2, uncontrolled, with renal complications 14/97/0263   DM2 (diabetes mellitus, type 2) ([place])    Dysfunction of left eustachian tube, with pusatile tinnitus 11/24/2017   Dyslipidemia associated with type 2 diabetes mellitus ([place]) 06/25/2016   ESRD (end stage renal disease) on dialysis [facility])    On hemodialysis in July 2019 via [facility] then switched to [facility] in Nov 2019.    ESRD with anemia (HCC)    Fibromyalgia    Gastroparesis due to DM    GERD (gastroesophageal reflux disease)  Headache    Hypertension associated with diabetes ([place]) 02/19/2011   IBS (irritable bowel syndrome)    Left-sided weakness 12/10/2017   .   Leiomyoma of uterus    Pancreatitis    PE (pulmonary thromboembolism) ([place]) 11/04/2019   Pneumonia    Proliferative diabetic retinopathy ([place]) 09/29/2015   Pronation deformity of both feet 06/14/2014   Reactive depression  12/10/2017   .   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[place]) 02/17/2018   Thromboembolism ([place]) 04/05/2018   Upper airway cough syndrome, with recs to stay off ACE and take Pepcid q hs 11/15/2016   Urticaria    Past Surgical History:  Procedure Laterality Date   A/V FISTULAGRAM Right 03/03/2020   Procedure: Venogram;  Surgeon: Waynetta Sandy, MD;  Location: [place] [facility];  Service: Cardiovascular;  Laterality: Right;   ARTERY REPAIR Right 02/17/2018   Procedure: EXPLORATION OF RIGHT BRACHIAL ARTERY;  Surgeon: Conrad Lowman, MD;  Location: [place];  Service: Vascular;  Laterality: Right;   ARTERY REPAIR Right 02/17/2018   Procedure: BRACHIAL ARTERY EXPLORATION AND TRHOMBECTOMY;  Surgeon: Conrad [place], MD;  Location: [place];  Service: Vascular;  Laterality: Right;   AV FISTULA PLACEMENT Left 05/09/2017   Procedure: INSERTION OF ARTERIOVENOUS (AV) GRAFT ARM (ARTEGRAFT);  Surgeon: Conrad Belzoni, MD;  Location: [place];  Service: Vascular;  Laterality: Left;   AV FISTULA PLACEMENT Right 02/17/2018   Procedure: INSERTION OF ARTERIOVENOUS (AV) GORE-TEX GRAFT ARM RIGHT UPPER ARM;  Surgeon: Conrad Prosser, MD;  Location: [place];  Service: Vascular;  Laterality: Right;   AV FISTULA PLACEMENT Left 10/10/2019   Procedure: INSERTION OF LEFT ARTERIOVENOUS (AV) ARTEGRAFT GRAFT ARM;  Surgeon: Waynetta Sandy, MD;  Location: [place];  Service: Vascular;  Laterality: Left;   AV FISTULA PLACEMENT Right 03/19/2020   Procedure: INSERTION OF ARTERIOVENOUS (AV) GORE-TEX GRAFT IN RIGHT ARM AND VIABAHN STENT IN RIGHT AXILLARY ARTERY;  Surgeon: Waynetta Sandy, MD;  Location: [place];  Service: Vascular;  Laterality: Right;   [place] Left 08/31/2016   Procedure: BASILIC VEIN TRANSPOSITION FIRST STAGE;  Surgeon: Conrad Cascades, MD;  Location: [place];  Service: Vascular;  Laterality: Left;    BRONCHIAL WASHINGS  09/17/2021   Procedure: BRONCHIAL WASHINGS;  Surgeon: Collene Gobble, MD;  Location: [facility];  Service: Cardiopulmonary;;   CENTRAL VENOUS CATHETER INSERTION Left 07/24/2018   Procedure: INSERTION CENTRAL LINE ADULT;  Surgeon: Conrad [place], MD;  Location: [facility] OR;  Service: Vascular;  Laterality: Left;   EYE SURGERY     secondary to diabetic retinopathy    FOOT SURGERY Right    t"ook bone out- maybe hammer toe"   INSERTION OF DIALYSIS CATHETER Left 07/24/2018   Procedure: INSERTION OF TUNNELED DIALYSIS CATHETER;  Surgeon: Conrad [place], MD;  Location: [place];  Service: Vascular;  Laterality: Left;   IR FLUORO GUIDE CV LINE LEFT  12/19/2021   IR FLUORO GUIDE CV LINE LEFT  12/29/2021   IR FLUORO GUIDE CV LINE RIGHT  07/21/2018   IR FLUORO GUIDE CV LINE RIGHT  06/01/2019   IR FLUORO GUIDE CV LINE RIGHT  05/05/2020   IR FLUORO GUIDE CV LINE RIGHT  10/04/2022   IR REMOVAL TUN CV CATH W/O FL  01/23/2021   IR REMOVAL TUN CV CATH W/O FL  12/19/2021   IR REMOVAL TUN CV CATH W/O FL  10/01/2022   IR US GUIDE VASC ACCESS LEFT  12/19/2021   IR US GUIDE VASC ACCESS RIGHT  07/21/2018   IR US GUIDE VASC ACCESS RIGHT  06/01/2019   IR US GUIDE VASC ACCESS RIGHT  10/04/2022   LIGATION ARTERIOVENOUS GORTEX GRAFT Right 03/20/2020   Procedure: LIGATION ARM ARTERIOVENOUS GORTEX GRAFT With Patch Angioplasty, Thrombectomy;  Surgeon: Waynetta Sandy, MD;  Location: [place];  Service: Vascular;  Laterality: Right;   LIGATION OF ARTERIOVENOUS  FISTULA Left 05/09/2017   Procedure: LIGATION OF ARTERIOVENOUS  FISTULA;  Surgeon: Conrad [place], MD;  Location: [place];  Service: Vascular;  Laterality: Left;   LOOP RECORDER INSERTION N/A 12/05/2017   Procedure: LOOP RECORDER INSERTION;  Surgeon: Evans Lance, MD;  Location: [place] [facility];  Service: Cardiovascular;  Laterality: N/A;   MYOMECTOMY     REMOVAL OF GRAFT Right 02/17/2018   Procedure: REMOVAL OF RIGHT UPPER ARM ARTERIOVENOUS GRAFT;   Surgeon: Conrad Caberfae, MD;  Location: [place];  Service: Vascular;  Laterality: Right;   REVISION OF ARTERIOVENOUS GORETEX GRAFT Left 07/24/2018   Procedure: REDO ARTERIOVENOUS GORETEX GRAFT;  Surgeon: Conrad Ronco, MD;  Location: [place];  Service: Vascular;  Laterality: Left;   TEE WITHOUT CARDIOVERSION N/A 12/05/2017   Procedure: TRANSESOPHAGEAL ECHOCARDIOGRAM (TEE);  Surgeon: Sanda Klein, MD;  Location: [place];  Service: Cardiovascular;  Laterality: N/A;   TEE WITHOUT CARDIOVERSION N/A 09/30/2022   Procedure: TRANSESOPHAGEAL ECHOCARDIOGRAM (TEE);  Surgeon: Jerline Pain, MD;  Location: [facility];  Service: Cardiovascular;  Laterality: N/A;   UPPER EXTREMITY VENOGRAPHY Left 07/13/2018   Procedure: UPPER EXTREMITY VENOGRAPHY - Central & Left Arm;  Surgeon: Conrad Perry, MD;  Location: [place] [facility];  Service: Cardiovascular;  Laterality: Left;   UPPER EXTREMITY VENOGRAPHY Bilateral 09/10/2019   Procedure: UPPER EXTREMITY VENOGRAPHY;  Surgeon: Waynetta Sandy, MD;  Location: [place] [facility];  Service: Cardiovascular;  Laterality: Bilateral;   UTERINE FIBROID SURGERY     VIDEO BRONCHOSCOPY N/A 09/17/2021   Procedure: VIDEO BRONCHOSCOPY WITHOUT FLUORO;  Surgeon: Collene Gobble, MD;  [facility]: [facility];  Service: Cardiopulmonary;  Laterality: N/A;   VITRECTOMY Bilateral    Social History:  reports that she has never smoked. She has never been exposed to tobacco smoke. She has never used smokeless tobacco. She reports that she does not drink alcohol and does not use drugs. Pt is employed and works from home  Allergies  Allergen Reactions   Ibuprofen Other (See Comments)    CKD stage 3. Should avoid.   Dilaudid [Hydromorphone Hcl] Itching and Other (See Comments)    Can take with Benadryl.   Iodinated Contrast Media Rash   Tramadol Itching    Family History  Problem Relation Age of Onset   Colon polyps Mother    Diabetes Mother    Heart murmur Father         history essentially unknown   Hypertension Father    Diabetes Sister    Kidney failure Sister        kidney transplant   Diabetes Brother    Retinal degeneration Brother    Heart murmur Son    Stroke Paternal Grandmother    Diabetes Sister     Prior to Admission medications   Medication Sig Start Date End Date Taking? Authorizing Provider  amLODipine-olmesartan (AZOR) 5-20 MG tablet Take 1 tablet by mouth daily. 10/26/22   Thurnell Lose, MD  aspirin 81 MG chewable  tablet Chew 1 tablet (81 mg total) by mouth daily. 10/24/22   Thurnell Lose, MD  azelastine (ASTELIN) 0.1 % nasal spray Place 2 sprays into both nostrils 2 (two) times daily. 10/12/22   [name], MD  calcium acetate (PHOSLO) 667 MG capsule Take 2,001 mg by mouth 3 (three) times daily with meals. 08/25/21   [provider]  ceFEPIme 2 g in sodium chloride 0.9 % 100 mL Inject 2 g into the vein every Monday, Wednesday, and Friday at 8 PM. 10/06/22   Ghimire, Henreitta Leber, MD  clindamycin (CLEOCIN T) 1 % SWAB Apply 1 Application topically 3 (three) times a week. 09/22/22   [provider]  Continuous Blood Gluc Sensor (FREESTYLE LIBRE 14 DAY SENSOR) MISC 1 patch by Does not apply route every 14 (fourteen) days. 02/25/22   [name], MD  cromolyn (OPTICROM) 4 % ophthalmic solution Place 1 drop into both eyes 4 (four) times daily as needed. 12/08/22   Clemon Chambers, MD  DIFLUCAN 100 MG tablet Take by mouth. 10/20/22   [provider]  Doxercalciferol (HECTOROL IV) Doxercalciferol (Hectorol) 05/07/22 05/06/23  [provider]  fluticasone (FLONASE) 50 MCG/ACT nasal spray Place 2 sprays into both nostrils daily. 12/01/22   Clemon Chambers, MD  gabapentin (NEURONTIN) 300 MG capsule Take 1 capsule (300 mg total) by mouth every Monday, Wednesday, and Friday. At 10:30 am 10/13/22   [name], MD  insulin aspart (NOVOLOG FLEXPEN) 100 UNIT/ML FlexPen Inject 6 Units into the skin 3 (three)  times daily with meals. Sliding scale 02/25/22   [name], MD  insulin degludec (TRESIBA FLEXTOUCH) 100 UNIT/ML FlexTouch Pen Inject 20 Units into the skin at bedtime. Patient taking differently: Inject 26 Units into the skin at bedtime. 10/05/22   Ghimire, Henreitta Leber, MD  Insulin Pen Needle 29G X 5MM MISC 1 Device by Does not apply route 4 (four) times daily. 12/26/19   Shamleffer, Melanie Crazier, MD  ketoconazole (NIZORAL) 2 % cream Apply 1 Application topically 2 (two) times a week. 09/22/22   [provider]  loratadine (CLARITIN) 10 MG tablet Take 10 mg by mouth daily. 09/30/22   [provider]  Methoxy PEG-Epoetin Beta (MIRCERA IJ) Mircera 03/03/22 03/02/23  [provider]  Olopatadine HCl (PATADAY) 0.2 % SOLN Apply 1 drop to eye daily. Patient taking differently: Apply 1 drop to eye daily as needed (dry eyes). 10/12/22   [name], MD  pantoprazole (PROTONIX) 40 MG tablet Take 1 tablet (40 mg total) by mouth daily. 12/01/22   Clemon Chambers, MD  rosuvastatin (CRESTOR) 10 MG tablet Take 1 tablet (10 mg total) by mouth daily. 10/25/22   Thurnell Lose, MD  triamcinolone ointment (KENALOG) 0.1 % Apply topically twice daily to BODY as needed for red, sandpaper like rash.  Do not use on face, groin or armpits. 12/01/22   Clemon Chambers, MD  vancomycin [facility]) 1-5 GM/200ML-% SOLN Inject 200 mLs (1,000 mg total) into the vein every Monday, Wednesday, and Friday with hemodialysis. 10/06/22   Jonetta Osgood, MD    Physical Exam: Vitals:   12/12/22 2215 12/12/22 2230 12/12/22 2245 12/12/22 2300  BP: (!) 217/116 (!) 219/94 (!) 190/103 (!) 174/101  Pulse:  82 83 80  Resp: '11 16 13 '$ (!) 9  Temp:      TempSrc:      SpO2:  100% 100% 100%   Constitutional: NAD, lethargic appearing obese female sitting  upright in bed. Eyes: PERRL, lids and conjunctivae normal ENMT: Mucous membranes are moist.  Neck: normal, supple, no masses, no thyromegaly Respiratory:  clear to auscultation bilaterally, no wheezing, no crackles. Normal respiratory effort. No accessory muscle use.  Cardiovascular: Regular rate and rhythm, no murmurs / rubs / gallops. No extremity edema.  Right HD port.  Abdomen: no tenderness, Bowel sounds positive.  Musculoskeletal: no clubbing / cyanosis. Right BKA. Normal muscle tone.  Skin: no rashes, lesions, ulcers. No induration. No wounds or pressure ulcers.  Neurologic: Patient is lethargic but was able to track with her eyes when prompted to look around room. She awoke easily to voice but would drift back off to sleep. Able to node when asked about headache and attempted to move her mouth but aphasic. Not able to follow other commands as she was lethargic and falls immediately back to sleep. psychiatric: Unable to access with AMS Data Reviewed:  See HPI  Assessment and Plan: * Stroke [facility]) -presents with AMS and aphasia -MRI brain revealing for acute/subacute nonhemorrhagic infarct of the right occipital pole. There is a new 9 mm posterior left cerebral infarct that is new from prior MRI but not acute. -pt was recently hospitalized early October for infective endocarditis and severe weeks after developed embolic stroke with endocarditis likely source. She was not resumed on Eliquis which she was taking for remote PE/DVT due to risk of hemorrhagic conversion in the setting of endocarditis related embolic stroke. She was placed instead on aspirin at that time.  -appreciate neurology recommendation on DAPT vs anticoagulation at this time -obtain echocardiogram  -Obtain lipids -Last A1C 09/2022 of 6.2 -PT/OT/SLT -Frequent neuro checks and keep on telemetry -Allow for permissive hypertension with blood pressure treatment as needed only if systolic goes above 103  Acute metabolic encephalopathy -pt with new acute/subacute stroke to her right occipital lobe which does not explain her current symptoms of lethargy and aphasia.  She did  take 2 doses of 5 mg baclofen yesterday which she does not normally do.  Suspect this could most likely could be the cause of her lethargy given her ESRD status.  -pt with no fever, leukocytosis or any signs of infection. Ammonia within normal limits -she only sparsely makes urine so unable to obtain UA but again doubt infection  -will need to continue to observe clinically and follow  PVD (peripheral vascular disease) ([place]) S/p right BKA. Wheelchair bound.   ESRD on dialysis ([place]) -HD MWF. No missed dialysis. Creatinine stable. -needs nephrology consult in the morning.  Anemia in chronic kidney disease, on chronic dialysis (HCC) -Hgb stable around her baseline  Hypertension associated with diabetes ([place]) -allowing for permissive HTN with stroke. She was initially given IV '10mg'$  Labetalol by ED physician prior to the results of her MRI due to concerns for hypertensive encephalopathy with presenting SBP of 250  Diabetes mellitus, type II, insulin dependent ([place]), on CGM, with end organ damage: renal, neuropathy, gastroparesis, retinopathy - Hemoglobin A1c on 09/2022 of 7.4 -Moderate SSI      Advance Care Planning:   Code Status: Full Code   Consults: neurology  Family Communication: son and husband at bedside  Severity of Illness: The appropriate patient status for this patient is OBSERVATION. Observation status is judged to be reasonable and necessary in order to provide the required intensity of service to ensure the patient's safety. The patient's presenting symptoms, physical exam findings, and initial radiographic and laboratory data in the context of their medical condition is  felt to place them at decreased risk for further clinical deterioration. Furthermore, it is anticipated that the patient will be medically stable for discharge from the hospital within 2 midnights of admission.   Author: Orene Desanctis, DO 12/12/2022 11:12 PM  For on call review [email or web address].

## 2022-12-12 NOTE — Assessment & Plan Note (Signed)
-  pt with new acute/subacute stroke to her right occipital lobe which does not explain her current symptoms of lethargy and aphasia.  She did take 2 doses of 5 mg baclofen yesterday which she does not normally do.  Suspect this could most likely could be the cause of her lethargy given her ESRD status.  -pt with no fever, leukocytosis or any signs of infection. Ammonia within normal limits -she only sparsely makes urine so unable to obtain UA but again doubt infection  -will need to continue to observe clinically and follow

## 2022-12-13 ENCOUNTER — Inpatient Hospital Stay (HOSPITAL_COMMUNITY): Payer: Medicare Other

## 2022-12-13 ENCOUNTER — Observation Stay (HOSPITAL_COMMUNITY): Payer: Medicare Other

## 2022-12-13 DIAGNOSIS — Z794 Long term (current) use of insulin: Secondary | ICD-10-CM | POA: Diagnosis not present

## 2022-12-13 DIAGNOSIS — E871 Hypo-osmolality and hyponatremia: Secondary | ICD-10-CM | POA: Diagnosis present

## 2022-12-13 DIAGNOSIS — I330 Acute and subacute infective endocarditis: Secondary | ICD-10-CM | POA: Diagnosis not present

## 2022-12-13 DIAGNOSIS — E1165 Type 2 diabetes mellitus with hyperglycemia: Secondary | ICD-10-CM | POA: Diagnosis present

## 2022-12-13 DIAGNOSIS — E1151 Type 2 diabetes mellitus with diabetic peripheral angiopathy without gangrene: Secondary | ICD-10-CM | POA: Diagnosis present

## 2022-12-13 DIAGNOSIS — G9340 Encephalopathy, unspecified: Secondary | ICD-10-CM | POA: Diagnosis not present

## 2022-12-13 DIAGNOSIS — Z7189 Other specified counseling: Secondary | ICD-10-CM | POA: Diagnosis not present

## 2022-12-13 DIAGNOSIS — D631 Anemia in chronic kidney disease: Secondary | ICD-10-CM | POA: Diagnosis present

## 2022-12-13 DIAGNOSIS — Z1152 Encounter for screening for COVID-19: Secondary | ICD-10-CM | POA: Diagnosis not present

## 2022-12-13 DIAGNOSIS — I340 Nonrheumatic mitral (valve) insufficiency: Secondary | ICD-10-CM | POA: Diagnosis not present

## 2022-12-13 DIAGNOSIS — I679 Cerebrovascular disease, unspecified: Secondary | ICD-10-CM | POA: Diagnosis not present

## 2022-12-13 DIAGNOSIS — I63431 Cerebral infarction due to embolism of right posterior cerebral artery: Secondary | ICD-10-CM

## 2022-12-13 DIAGNOSIS — N186 End stage renal disease: Secondary | ICD-10-CM | POA: Diagnosis present

## 2022-12-13 DIAGNOSIS — I6389 Other cerebral infarction: Secondary | ICD-10-CM

## 2022-12-13 DIAGNOSIS — G9341 Metabolic encephalopathy: Secondary | ICD-10-CM | POA: Diagnosis present

## 2022-12-13 DIAGNOSIS — I63412 Cerebral infarction due to embolism of left middle cerebral artery: Secondary | ICD-10-CM | POA: Diagnosis present

## 2022-12-13 DIAGNOSIS — Z992 Dependence on renal dialysis: Secondary | ICD-10-CM | POA: Diagnosis not present

## 2022-12-13 DIAGNOSIS — R4701 Aphasia: Secondary | ICD-10-CM | POA: Diagnosis present

## 2022-12-13 DIAGNOSIS — E1159 Type 2 diabetes mellitus with other circulatory complications: Secondary | ICD-10-CM | POA: Diagnosis not present

## 2022-12-13 DIAGNOSIS — E0590 Thyrotoxicosis, unspecified without thyrotoxic crisis or storm: Secondary | ICD-10-CM | POA: Diagnosis present

## 2022-12-13 DIAGNOSIS — E119 Type 2 diabetes mellitus without complications: Secondary | ICD-10-CM | POA: Diagnosis not present

## 2022-12-13 DIAGNOSIS — E1169 Type 2 diabetes mellitus with other specified complication: Secondary | ICD-10-CM | POA: Diagnosis present

## 2022-12-13 DIAGNOSIS — D696 Thrombocytopenia, unspecified: Secondary | ICD-10-CM | POA: Diagnosis present

## 2022-12-13 DIAGNOSIS — I120 Hypertensive chronic kidney disease with stage 5 chronic kidney disease or end stage renal disease: Secondary | ICD-10-CM | POA: Diagnosis not present

## 2022-12-13 DIAGNOSIS — Z8673 Personal history of transient ischemic attack (TIA), and cerebral infarction without residual deficits: Secondary | ICD-10-CM | POA: Diagnosis not present

## 2022-12-13 DIAGNOSIS — I959 Hypotension, unspecified: Secondary | ICD-10-CM | POA: Diagnosis present

## 2022-12-13 DIAGNOSIS — E785 Hyperlipidemia, unspecified: Secondary | ICD-10-CM | POA: Diagnosis present

## 2022-12-13 DIAGNOSIS — E1122 Type 2 diabetes mellitus with diabetic chronic kidney disease: Secondary | ICD-10-CM | POA: Diagnosis not present

## 2022-12-13 DIAGNOSIS — I5189 Other ill-defined heart diseases: Secondary | ICD-10-CM | POA: Diagnosis not present

## 2022-12-13 DIAGNOSIS — I639 Cerebral infarction, unspecified: Secondary | ICD-10-CM | POA: Diagnosis not present

## 2022-12-13 DIAGNOSIS — R4182 Altered mental status, unspecified: Secondary | ICD-10-CM

## 2022-12-13 DIAGNOSIS — E669 Obesity, unspecified: Secondary | ICD-10-CM | POA: Diagnosis present

## 2022-12-13 DIAGNOSIS — Z515 Encounter for palliative care: Secondary | ICD-10-CM | POA: Diagnosis not present

## 2022-12-13 DIAGNOSIS — E1143 Type 2 diabetes mellitus with diabetic autonomic (poly)neuropathy: Secondary | ICD-10-CM | POA: Diagnosis present

## 2022-12-13 DIAGNOSIS — I152 Hypertension secondary to endocrine disorders: Secondary | ICD-10-CM | POA: Diagnosis present

## 2022-12-13 DIAGNOSIS — Z89511 Acquired absence of right leg below knee: Secondary | ICD-10-CM | POA: Diagnosis not present

## 2022-12-13 DIAGNOSIS — N2581 Secondary hyperparathyroidism of renal origin: Secondary | ICD-10-CM | POA: Diagnosis present

## 2022-12-13 LAB — HEPATITIS B SURFACE ANTIGEN: Hepatitis B Surface Ag: NONREACTIVE

## 2022-12-13 LAB — ECHOCARDIOGRAM COMPLETE
Area-P 1/2: 2.42 cm2
MV VTI: 1.43 cm2
S' Lateral: 2.5 cm

## 2022-12-13 LAB — GLUCOSE, CAPILLARY: Glucose-Capillary: 119 mg/dL — ABNORMAL HIGH (ref 70–99)

## 2022-12-13 LAB — LIPID PANEL
Cholesterol: 212 mg/dL — ABNORMAL HIGH (ref 0–200)
HDL: 41 mg/dL (ref >40)
LDL Cholesterol: 135 mg/dL — ABNORMAL HIGH (ref 0–99)
Total CHOL/HDL Ratio: 5.2 RATIO
Triglycerides: 178 mg/dL — ABNORMAL HIGH (ref <150)
VLDL: 36 mg/dL (ref 0–40)

## 2022-12-13 LAB — CBG MONITORING, ED: Glucose-Capillary: 167 mg/dL — ABNORMAL HIGH (ref 70–99)

## 2022-12-13 LAB — VITAMIN B12: Vitamin B-12: 449 pg/mL (ref 180–914)

## 2022-12-13 LAB — TSH: TSH: 1.63 u[IU]/mL (ref 0.350–4.500)

## 2022-12-13 MED ORDER — ASPIRIN 81 MG PO TBEC
81.0000 mg | DELAYED_RELEASE_TABLET | Freq: Every day | ORAL | Status: DC
  Filled 2022-12-13: qty 1

## 2022-12-13 MED ORDER — VANCOMYCIN HCL 10 G IV SOLR
2250.0000 mg | Freq: Once | INTRAVENOUS | Status: AC
  Administered 2022-12-13: 2250 mg via INTRAVENOUS
  Filled 2022-12-13: qty 2250

## 2022-12-13 MED ORDER — SODIUM CHLORIDE 0.9% FLUSH
10.0000 mL | INTRAVENOUS | Status: DC | PRN
  Administered 2022-12-18: 10 mL

## 2022-12-13 MED ORDER — SODIUM CHLORIDE 0.9% FLUSH
10.0000 mL | Freq: Two times a day (BID) | INTRAVENOUS | Status: DC
  Administered 2022-12-14 – 2022-12-23 (×18): 10 mL

## 2022-12-13 MED ORDER — CHLORHEXIDINE GLUCONATE CLOTH 2 % EX PADS
6.0000 | MEDICATED_PAD | Freq: Every day | CUTANEOUS | Status: DC
  Administered 2022-12-14 – 2022-12-15 (×2): 6 via TOPICAL

## 2022-12-13 MED ORDER — ASPIRIN 300 MG RE SUPP
300.0000 mg | Freq: Every day | RECTAL | Status: DC
  Administered 2022-12-14 (×2): 300 mg via RECTAL
  Filled 2022-12-13 (×2): qty 1

## 2022-12-13 MED ORDER — CLOPIDOGREL BISULFATE 75 MG PO TABS
75.0000 mg | ORAL_TABLET | Freq: Every day | ORAL | Status: DC
  Filled 2022-12-13: qty 1

## 2022-12-13 MED ORDER — VANCOMYCIN HCL IN DEXTROSE 1-5 GM/200ML-% IV SOLN
1000.0000 mg | INTRAVENOUS | Status: DC
  Filled 2022-12-13: qty 200

## 2022-12-13 MED ORDER — PIPERACILLIN-TAZOBACTAM IN DEX 2-0.25 GM/50ML IV SOLN
2.2500 g | Freq: Three times a day (TID) | INTRAVENOUS | Status: DC
  Administered 2022-12-14: 2.25 g via INTRAVENOUS
  Filled 2022-12-13 (×4): qty 50

## 2022-12-13 NOTE — Progress Notes (Signed)
Echocardiogram 2D Echocardiogram has been performed.  Oneal Deputy Sydni Elizarraraz RDCS 12/13/2022, 11:23 AM

## 2022-12-13 NOTE — Progress Notes (Signed)
EEG LTM hooked up. Patient with non MRI leads. [facility] not monitoring since patient is in the ED. Will monitor after she gets a room.

## 2022-12-13 NOTE — Evaluation (Signed)
Occupational Therapy Evaluation Patient Details Name: Heather [name] MRN: [id number] DOB: 05-01-1971 Today's Date: 12/13/2022   History of Present Illness 51 year old admitted with new CVAs.  CT scan of the head with left paramedian frontal parietal region infarct.  MRI showed new 9 mm posterior left cerebral infarct.  Pt with PMH of cardiac arrest, stroke, ESRD on hemodialysis Monday Wednesday Friday, insulin-dependent type 2 diabetes with gastroparesis, PE and DVT on Eliquis, hypertension, right below-knee amputation who was brought from home with altered mental status and aphasia.  Hospitalization 10/21/2022-10/24/2022 with acute infarct left paramedian frontoparietal region.   Clinical Impression   Pt not following one step commands currently and writhing around in bed during start of eval without confirmation on what was hurting her.  Elevated BP in sitting at 195/102 and then when back in supine, similar results.  She was unable to complete any standing during attempt without RLE prosthesis.  Per her spouse who was present, she was heel to toe transferring on the LLE for toileting tasks at home from wheelchair.  Pt non-verbal throughout and needed total hand over hand assistance for washing face with the RUE secondary to decreased initiation and understanding.  Feel she will benefit from acute OT at this time to work on increasing ADL independence.  Recommend SNF for follow-up therapy post acute stay.        Recommendations for follow up therapy are one component of a multi-disciplinary discharge planning process, led by the attending physician.  Recommendations may be updated based on patient status, additional functional criteria and insurance authorization.   Follow Up Recommendations  Skilled nursing-short term rehab (<3 hours/day)     Assistance Recommended at Discharge Frequent or constant Supervision/Assistance  Patient can return home with the following A lot of help with  walking and/or transfers;A lot of help with bathing/dressing/bathroom;Help with stairs or ramp for entrance;Direct supervision/assist for medications management;Assistance with feeding;Assist for transportation;Direct supervision/assist for financial management    Functional Status Assessment  Patient has had a recent decline in their functional status and demonstrates the ability to make significant improvements in function in a reasonable and predictable amount of time.  Equipment Recommendations  Other (comment) (TBD next venue of care)       Precautions / Restrictions Precautions Precautions: Fall Precaution Comments: R BKA Restrictions Weight Bearing Restrictions: No      Mobility Bed Mobility Overal bed mobility: Needs Assistance Bed Mobility: Supine to Sit, Sit to Supine     Supine to sit: Total assist Sit to supine: Total assist   General bed mobility comments: Total assist for managing trunk and LEs.    Transfers                   General transfer comment: unable      Balance Overall balance assessment: Needs assistance Sitting-balance support: Single extremity supported Sitting balance-Leahy Scale: Poor Sitting balance - Comments: LOB to the right in sitting     Standing balance-Leahy Scale: Zero Standing balance comment: unable                           ADL either performed or assessed with clinical judgement   ADL Overall ADL's : Needs assistance/impaired Eating/Feeding: Total assistance;Bed level Eating/Feeding Details (indicate cue type and reason): simulated Grooming: Wash/dry face;Total assistance;Bed level Grooming Details (indicate cue type and reason): hand over hand assist for initiation and completion using the RUE.  Functional mobility during ADLs: Total assistance (total assist for supine to sit edge of stretcher) General ADL Comments: Pt's spouse present for session and reports pt  was completing transfers to the toilet by herself using the sink at home and heel to toe method on the LLE.  She was not wearing her prosthesis currently as it is not fitting properly.  Spouse reports working at evening and pt being alone.  She was unable to maintain sitting balance EOB this session without max assist secondary to right lean.  Attempted standing but unable and needed total assist for scoots up to the right on the EOB.     Vision Baseline Vision/History: 0 No visual deficits Ability to See in Adequate Light: 0 Adequate (unable to determine exactly secondary to pt not following commands and being non-verbal.) Additional Comments: Unable to determine as pt not able to follow commands for vision testing.            Pertinent Vitals/Pain Pain Assessment Pain Assessment: Faces Faces Pain Scale: Hurts little more Pain Location: generalized Pain Descriptors / Indicators: Discomfort, Restless Pain Intervention(s): Monitored during session, Repositioned     Hand Dominance Right   Extremity/Trunk Assessment Upper Extremity Assessment Upper Extremity Assessment: Difficult to assess due to impaired cognition (Pt with occasional spontaneous movement of arms when writhing in bed.  Unable to follow commands for MMT.  Will continue to assess in treatment.)   Lower Extremity Assessment Lower Extremity Assessment: Defer to PT evaluation   Cervical / Trunk Assessment Cervical / Trunk Assessment: Normal   Communication Communication Communication: Expressive difficulties   Cognition Arousal/Alertness: Awake/alert Behavior During Therapy: Restless (pt writhing around on the stretcher.  unable to point or state discomfort.  Her spouse reports spasms in her LEs as times but unable to determine at this time.) Overall Cognitive Status: Impaired/Different from baseline Area of Impairment: Following commands                       Following Commands:  (Pt not following one step  commands consistently)       General Comments: Pt only followed one command during session to look at therapist on the right.  Otherwise she was completely non-verbal and exhibited increased fear with sitting edge of stretcher.                Home Living Family/patient expects to be discharged to:: Private residence Living Arrangements: Spouse/significant other Available Help at Discharge: Other (Comment) (spouse work 3 pm -12 am.  Son may be able to help some) Type of Home: House Home Access: Stairs to enter [employer] of Steps: 3 Entrance Stairs-Rails: Right;Left Home Layout: One level     Bathroom Shower/Tub: Teacher, early years/pre: Standard Bathroom Accessibility: Yes   Home Equipment: IT sales professional (4 wheels);Tub bench;Hand held shower head   Additional Comments: Pt sponge bathed most of the time.  She has RLE prosthesis but hasn't been fitting well so she hasn't been using      Prior Functioning/Environment Prior Level of Function : Independent/Modified Independent             Mobility Comments: independent with transfers car, toilet, wheelchair, husband  provide transportation to HD ADLs Comments: spouse provided assist with bathing and some dressing but spouse assist most of the time        OT Problem List: Decreased strength;Decreased knowledge of use of DME or AE;Decreased coordination;Impaired balance (sitting and/or standing);Decreased safety  awareness;Pain;Decreased cognition;Decreased activity tolerance      OT Treatment/Interventions: Self-care/ADL training;Patient/family education;Therapeutic exercise;Balance training;Therapeutic activities;DME and/or AE instruction;Cognitive remediation/compensation;Neuromuscular education;Manual therapy    OT Goals(Current goals can be found in the care plan section) Acute Rehab OT Goals Patient Stated Goal: Pt did not state but spouse agreeable with OT goals for feeding,  grooming, toileting currently based on performance during eval. OT Goal Formulation: Patient unable to participate in goal setting Time For Goal Achievement: 12/27/22 Potential to Achieve Goals: Good  OT Frequency: Min 2X/week       AM-PAC OT "6 Clicks" Daily Activity     Outcome Measure Help from another person eating meals?: A Lot Help from another person taking care of personal grooming?: A Lot Help from another person toileting, which includes using toliet, bedpan, or urinal?: Total Help from another person bathing (including washing, rinsing, drying)?: Total Help from another person to put on and taking off regular upper body clothing?: Total Help from another person to put on and taking off regular lower body clothing?: Total 6 Click Score: 8   End of Session Nurse Communication: Mobility status  Activity Tolerance: Patient limited by pain Patient left: in bed;with call bell/phone within reach;with family/visitor present  OT Visit Diagnosis: Unsteadiness on feet (R26.81);Muscle weakness (generalized) (M62.81);Other abnormalities of gait and mobility (R26.89);Cognitive communication deficit (R41.841);Other symptoms and signs involving cognitive function;Pain Symptoms and signs involving cognitive functions: Cerebral infarction Pain - Right/Left: Left Pain - part of body: Leg                Time: 1226-1306 OT Time Calculation (min): 40 min Charges:  OT General Charges $OT Visit: 1 Visit OT Evaluation $OT Eval Moderate Complexity: 1 Mod OT Treatments $Self Care/Home Management : 23-37 mins  Glenola Wheat OTR/L 12/13/2022, 2:01 PM

## 2022-12-13 NOTE — Procedures (Signed)
Patient Name: Heather [name]  MRN: [id number]  Epilepsy Attending: Lora Havens  Referring Physician/Provider: Amie [place], MD  Date: 12/13/2022 Duration: 21.38 mins  Patient history: 51 year old female with altered mental status.  EEG to evaluate for seizure.  Level of alertness: Awake  AEDs during EEG study: None  Technical aspects: This EEG study was done with scalp electrodes positioned according to the 10-20 International system of electrode placement. Electrical activity was reviewed with band pass filter of 1-'70Hz'$ , sensitivity of 7 uV/mm, display speed of 71m/sec with a '60Hz'$  notched filter applied as appropriate. EEG data were recorded continuously and digitally stored.  Video monitoring was available and reviewed as appropriate.  Description: No clear posterior dominant rhythm was seen.  EEG showed continuous generalized 3-5 Hz theta-delta slowing. Generalized periodic discharges with triphasic morphology were also noted at 1 to 1.5 Hz.  Hyperventilation and photic stimulation were not performed.     ABNORMALITY - Periodic discharges with triphasic morphology, generalized ( GPDs) - Continuous slow, generalized  IMPRESSION: This study showed generalized periodic discharges with triphasic morphology which can be on the ictal-interictal continuum.  However the frequency and morphology is more commonly indicative of toxic -metabolic causes. Additionally there is moderate diffuse encephalopathy, nonspecific etiology. No seizures were seen throughout the recording.  Gust Eugene OBarbra Sarks

## 2022-12-13 NOTE — Evaluation (Signed)
Speech Language Pathology Evaluation Patient Details Name: Heather [name] MRN: [id number] DOB: 04/16/1971 Today's Date: 12/13/2022 Time: 4098-1191 SLP Time Calculation (min) (ACUTE ONLY): 11 min  Problem List:  Patient Active Problem List   Diagnosis Date Noted   PVD (peripheral vascular disease) ([place]) 47/82/9562   Acute metabolic encephalopathy 13/07/6577   Stroke ([place]) 10/21/2022   Allergic conjunctivitis 10/12/2022   Mitral valve mass    Right atrial mass    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   Shortness of breath 07/02/2022   Thrombus of venous dialysis catheter ([place]) 02/25/2022   Displacement of vascular dialysis catheter, initial encounter ([place]) 12/18/2021   S/P BKA (below knee amputation), right ([place]) 03/31/2021   Impaired mobility and ADLs 03/25/2021   CVA (cerebral vascular accident) ([place]) 03/04/2021   Pulmonary embolus ([place]) 03/04/2021   Charcot ankle, right 02/23/2021   Hx of cardiac arrest 11/12/2020   Abnormal CT of the chest 09/26/2020   Allergy, unspecified, initial encounter 09/22/2020   Other mechanical complication of other cardiac and vascular devices and implants, initial encounter ([place]) 05/10/2020   Positive ANA (antinuclear antibody) 04/16/2020   ESRD on dialysis [facility])    Hemiplegia and hemiparesis following cerebral infarction affecting left non-dominant side ([place]) 12/29/2018   IBS (irritable bowel syndrome) 08/06/2017   Dyslipidemia associated with type 2 diabetes mellitus ([place]) 06/25/2016   Proliferative diabetic retinopathy ([place]) 09/29/2015   Chronic cough 10/04/2014   B12 deficiency 05/10/2014   Anemia in chronic kidney disease, on chronic dialysis ([place]) 04/26/2014   CKD (chronic kidney disease) stage 5, GFR less than 15 ml/min (HCC) 04/26/2014   Secondary hyperparathyroidism of renal origin ([place]) 11/27/2013   Posterior subcapsular cataract, bilateral 10/18/2013   Diabetes mellitus, type II, insulin dependent ([place]), on CGM, with end organ damage: renal,  neuropathy, gastroparesis, retinopathy 02/19/2011   Hypertension associated with diabetes ([place]) 02/19/2011   Gastroparesis due to DM ([place]) 02/19/2011   Past Medical History:  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[place]), nonhemorrhagic, inferior right cerebellum 12/03/2017   left side weakness in leg   Cyst of left ovary 01/12/2018   Diabetic neuropathy, painful ([place]), on low dose Gabapentin 07/13/2013   DM (diabetes mellitus), type 2, uncontrolled, with renal complications 46/96/2952   DM2 (diabetes mellitus, type 2) ([place])    Dysfunction of left eustachian tube, with pusatile tinnitus 11/24/2017   Dyslipidemia associated with type 2 diabetes mellitus ([place]) 06/25/2016   ESRD (end stage renal disease) on dialysis [facility])    On hemodialysis in July 2019 via [facility] then switched to [facility] in Nov 2019.    ESRD with anemia (HCC)    Fibromyalgia    Gastroparesis due to DM    GERD (gastroesophageal reflux disease)    Headache    Hypertension associated with diabetes ([place]) 02/19/2011   IBS (irritable bowel syndrome)    Left-sided weakness 12/10/2017   .   Leiomyoma of uterus    Pancreatitis    PE (pulmonary thromboembolism) ([place]) 11/04/2019   Pneumonia    Proliferative diabetic retinopathy ([place]) 09/29/2015   Pronation deformity of both feet 06/14/2014   Reactive depression 12/10/2017   .   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[place]) 02/17/2018   Thromboembolism ([place]) 04/05/2018  Upper airway cough syndrome, with recs to stay off ACE and take Pepcid q hs 11/15/2016   Urticaria    Past Surgical History:  Past Surgical History:  Procedure Laterality Date   A/V FISTULAGRAM Right  03/03/2020   Procedure: Venogram;  Surgeon: Waynetta Sandy, MD;  Location: [facility];  Service: Cardiovascular;  Laterality: Right;   ARTERY REPAIR Right 02/17/2018   Procedure: EXPLORATION OF RIGHT BRACHIAL ARTERY;  Surgeon: Conrad [place]ville, MD;  Location: [facility];  Service: Vascular;  Laterality: Right;   ARTERY REPAIR Right 02/17/2018   Procedure: BRACHIAL ARTERY EXPLORATION AND TRHOMBECTOMY;  Surgeon: Conrad [place], MD;  Location: [facility] [facility];  Service: Vascular;  Laterality: Right;   AV FISTULA PLACEMENT Left 05/09/2017   Procedure: INSERTION OF ARTERIOVENOUS (AV) GRAFT ARM (ARTEGRAFT);  Surgeon: Conrad [place], MD;  Location: [facility];  Service: Vascular;  Laterality: Left;   AV FISTULA PLACEMENT Right 02/17/2018   Procedure: INSERTION OF ARTERIOVENOUS (AV) GORE-TEX GRAFT ARM RIGHT UPPER ARM;  Surgeon: Conrad Murray, MD;  Location: [facility];  Service: Vascular;  Laterality: Right;   AV FISTULA PLACEMENT Left 10/10/2019   Procedure: INSERTION OF LEFT ARTERIOVENOUS (AV) ARTEGRAFT GRAFT ARM;  Surgeon: Waynetta Sandy, MD;  Location: [place];  Service: Vascular;  Laterality: Left;   AV FISTULA PLACEMENT Right 03/19/2020   Procedure: INSERTION OF ARTERIOVENOUS (AV) GORE-TEX GRAFT IN RIGHT ARM AND VIABAHN STENT IN RIGHT AXILLARY ARTERY;  Surgeon: Waynetta Sandy, MD;  Location: [facility];  Service: Vascular;  Laterality: Right;   [place] Left 08/31/2016   Procedure: BASILIC VEIN TRANSPOSITION FIRST STAGE;  Surgeon: Conrad Egypt, MD;  Location: [facility];  Service: Vascular;  Laterality: Left;   BRONCHIAL WASHINGS  09/17/2021   Procedure: BRONCHIAL WASHINGS;  Surgeon: Collene Gobble, MD;  Location: [facility];  Service: Cardiopulmonary;;   CENTRAL VENOUS CATHETER INSERTION Left 07/24/2018   Procedure: INSERTION CENTRAL LINE ADULT;  Surgeon: Conrad Coloma, MD;  Location: [place];  Service: Vascular;  Laterality: Left;   EYE SURGERY     secondary to diabetic retinopathy     FOOT SURGERY Right    t"ook bone out- maybe hammer toe"   INSERTION OF DIALYSIS CATHETER Left 07/24/2018   Procedure: INSERTION OF TUNNELED DIALYSIS CATHETER;  Surgeon: Conrad Linwood, MD;  Location: [facility];  Service: Vascular;  Laterality: Left;   IR FLUORO GUIDE CV LINE LEFT  12/19/2021   IR FLUORO GUIDE CV LINE LEFT  12/29/2021   IR FLUORO GUIDE CV LINE RIGHT  07/21/2018   IR FLUORO GUIDE CV LINE RIGHT  06/01/2019   IR FLUORO GUIDE CV LINE RIGHT  05/05/2020   IR FLUORO GUIDE CV LINE RIGHT  10/04/2022   IR REMOVAL TUN CV CATH W/O FL  01/23/2021   IR REMOVAL TUN CV CATH W/O FL  12/19/2021   IR REMOVAL TUN CV CATH W/O FL  10/01/2022   IR US GUIDE VASC ACCESS LEFT  12/19/2021   IR US GUIDE VASC ACCESS RIGHT  07/21/2018   IR US GUIDE VASC ACCESS RIGHT  06/01/2019   IR US GUIDE VASC ACCESS RIGHT  10/04/2022   LIGATION ARTERIOVENOUS GORTEX GRAFT Right 03/20/2020   Procedure: LIGATION ARM ARTERIOVENOUS GORTEX GRAFT With Patch Angioplasty, Thrombectomy;  Surgeon: Waynetta Sandy, MD;  Location: [facility];  Service: Vascular;  Laterality: Right;   LIGATION OF ARTERIOVENOUS  FISTULA Left 05/09/2017   Procedure: LIGATION OF ARTERIOVENOUS  FISTULA;  Surgeon: Conrad Elgin, MD;  Location: [facility];  Service: Vascular;  Laterality: Left;   LOOP RECORDER INSERTION N/A 12/05/2017   Procedure: LOOP RECORDER INSERTION;  Surgeon: Evans Lance, MD;  Location: [facility];  Service: Cardiovascular;  Laterality: N/A;   MYOMECTOMY     REMOVAL OF GRAFT Right 02/17/2018   Procedure: REMOVAL OF RIGHT UPPER ARM ARTERIOVENOUS GRAFT;  Surgeon: Conrad K[place] Station, MD;  Location: [facility];  Service: Vascular;  Laterality: Right;   REVISION OF ARTERIOVENOUS GORETEX GRAFT Left 07/24/2018   Procedure: REDO ARTERIOVENOUS GORETEX GRAFT;  Surgeon: Conrad [place], MD;  Location: [facility];  Service: Vascular;  Laterality: Left;   TEE WITHOUT CARDIOVERSION N/A 12/05/2017   Procedure: TRANSESOPHAGEAL ECHOCARDIOGRAM (TEE);  Surgeon: Sanda Klein, MD;  Location: [place] [facility];  Service: Cardiovascular;  Laterality: N/A;   TEE WITHOUT CARDIOVERSION N/A 09/30/2022   Procedure: TRANSESOPHAGEAL ECHOCARDIOGRAM (TEE);  Surgeon: Jerline Pain, MD;  [facility];  Service: Cardiovascular;  Laterality: N/A;   UPPER EXTREMITY VENOGRAPHY Left 07/13/2018   Procedure: UPPER EXTREMITY VENOGRAPHY - Central & Left Arm;  Surgeon: Conrad Zwolle, MD;  Location: [facility];  Service: Cardiovascular;  Laterality: Left;   UPPER EXTREMITY VENOGRAPHY Bilateral 09/10/2019   Procedure: UPPER EXTREMITY VENOGRAPHY;  Surgeon: Waynetta Sandy, MD;  Location: [facility];  Service: Cardiovascular;  Laterality: Bilateral;   UTERINE FIBROID SURGERY     VIDEO BRONCHOSCOPY N/A 09/17/2021   Procedure: VIDEO BRONCHOSCOPY WITHOUT FLUORO;  Surgeon: Collene Gobble, MD;  [facility];  Service: Cardiopulmonary;  Laterality: N/A;   VITRECTOMY Bilateral    HPI:  51 year old presenting 12/17 with AMS and trouble speaking. [name] admitted with new CVAs. CT scan of the head with left paramedian frontal parietal region infarct. MRI showed new 9 mm posterior left cerebral infarct. Hospitalization 10/21/2022-10/24/2022 with acute infarct left paramedian frontoparietal region (scored 24/30 on SLUMS). PMH of cardiac arrest, stroke, GERD, upper airway cough syndrome, PNA, ESRD on HD MWF, insulin-dependent type 2 diabetes with gastroparesis, PE and DVT on Eliquis, HTN, right BKA   Assessment / Plan / Recommendation Clinical Impression  Pt has impaired receptive and expressive communication. She has no verbal output, and was observed to use one gesture for communication throughout the evaluation (clearly nodded "yes" when asked if she wanted to sleep). She does not repeat sounds, but does vocalize spontaneously very infrequently. Pt is drowsy but focuses her attention to auditory and tactile stimulation bilaterally. She did not follow any commands  at this time. Recommend ongoing SLP f/u given that this is a significant acute change in pt's cognitive-linguistic status.    SLP Assessment  SLP Recommendation/Assessment: Patient needs continued Speech Lanaguage Pathology Services SLP Visit Diagnosis: Cognitive communication deficit (R41.841);Aphasia (R47.01)    Recommendations for follow up therapy are one component of a multi-disciplinary discharge planning process, led by the attending physician.  Recommendations may be updated based on patient status, additional functional criteria and insurance authorization.    Follow Up Recommendations  Skilled nursing-short term rehab (<3 hours/day)    Assistance Recommended at Discharge  Frequent or constant Supervision/Assistance  Functional Status Assessment Patient has had a recent decline in their functional status and demonstrates the ability to make significant improvements in function in a reasonable and predictable amount of time.  Frequency and Duration min 2x/week  2 weeks      SLP Evaluation Cognition  Overall Cognitive Status: Difficult to assess Arousal/Alertness: Lethargic  Attention: Focused;Sustained Focused Attention: Appears intact Sustained Attention: Impaired Sustained Attention Impairment: Verbal basic;Functional basic       Comprehension  Auditory Comprehension Overall Auditory Comprehension: Impaired Yes/No Questions: Impaired Basic Biographical Questions: 0-25% accurate Commands: Impaired One Step Basic Commands: 0-24% accurate    Expression Expression Primary Mode of Expression: Verbal Verbal Expression Overall Verbal Expression: Impaired Initiation: Impaired Automatic Speech:  (none) Repetition: Impaired Level of Impairment:  (phoneme level) Non-Verbal Means of Communication:  (nodded head "yes" x1)   Oral / Motor  Oral Motor/Sensory Function Overall Oral Motor/Sensory Function: Other (comment) (UTA) Motor Speech Overall Motor Speech: Other  (comment) (UTA)            Osie Bond., M.A. [place] Office [phone]  Secure chat preferred  12/13/2022, 5:33 PM

## 2022-12-13 NOTE — Progress Notes (Signed)
Patient has been moved from ed to 3w .  Study is online and running; [facility] monitored, Event button test confirmed by [facility].

## 2022-12-13 NOTE — Progress Notes (Addendum)
STROKE TEAM PROGRESS NOTE   INTERVAL HISTORY Patient was evaluated at bedside with husband present in room. She was somnolent, difficult to rouse, and inattentive. Patient appeared confused, not speaking, minimally following basic commands. MRI shows small right occipital and left cerebellar acute infarct discharged with IV antibiotics.  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 unchanged from previous echo from 09/14/2022 Vitals:   12/12/22 2300 12/13/22 0130 12/13/22 0545 12/13/22 0700  BP: (!) 174/101 (!) 159/107 (!) 188/112 (!) 190/102  Pulse: 80 85 88 85  Resp: (!) '9 13 10 '$ (!) 8  Temp:    98 F (36.7 C)  TempSrc:      SpO2: 100% 100% 99% 96%   CBC:  Recent Labs  Lab 12/12/22 1356 12/12/22 1419  WBC 6.6  --   NEUTROABS 4.1  --   HGB 10.4* 13.3  HCT 34.2* 39.0  MCV 97.7  --   PLT 133*  --    Basic Metabolic Panel:  Recent Labs  Lab 12/12/22 1356 12/12/22 1419  NA 137 137  K 3.9 4.3  CL 98 101  CO2 27  --   GLUCOSE 187* 191*  BUN 39* 40*  CREATININE 10.31* 10.90*  CALCIUM 9.5  --    Urine Drug Screen: No results for input(s): "LABOPIA", "COCAINSCRNUR", "LABBENZ", "AMPHETMU", "THCU", "LABBARB" in the last 168 hours.  Alcohol Level No results for input(s): "ETH" in the last 168 hours.  IMAGING past 24 hours MR BRAIN WO CONTRAST  Result Date: 12/12/2022 CLINICAL DATA:  Altered mental status.  Aphasia. EXAM: MRI HEAD WITHOUT CONTRAST TECHNIQUE: Multiplanar, multiecho pulse sequences of the brain and surrounding structures were obtained without intravenous contrast. COMPARISON:  CT head without contrast 12/12/2022. MR head without contrast 10/21/2022. FINDINGS: Brain: 2 foci of restricted diffusion are present within the subcortical white matter of the right occipital pole. The larger measures 9 mm. T2 and FLAIR signal changes are associated with the infarcts. No other acute infarcts are present.  Moderate generalized atrophy and advanced diffuse confluent periventricular and scattered subcortical T2 hyperintensities bilaterally are stable. A remote right cerebellar infarct is stable. A posterior left cerebellar infarct measures 9 mm. This is new since the prior MRI but not acute. Expected evolution of nonhemorrhagic infarct in the medial left frontal parietal cortex is noted. The ventricles are proportionate to the degree of atrophy. No significant extraaxial fluid collection is present. White matter changes extending into the brainstem are stable. Vascular: Flow is present in the major intracranial arteries. Skull and upper cervical spine: The craniocervical junction is normal. Upper cervical spine is within normal limits. Marrow signal is unremarkable. Sinuses/Orbits: The paranasal sinuses and mastoid air cells are clear. Bilateral lens replacements are noted. Exophthalmos is stable. Globes and orbits are otherwise unremarkable. IMPRESSION: 1. 2 foci of restricted diffusion within the subcortical white matter of the right occipital pole consistent with acute/subacute nonhemorrhagic infarcts. 2. 9 mm posterior left cerebellar infarct is new since the prior MRI, but not acute 3. Expected evolution of nonhemorrhagic infarct in the medial left frontal parietal cortex. 4. Moderate generalized atrophy and advanced white matter disease likely reflects the sequela of chronic microvascular ischemia. These results were called by telephone at the time of interpretation on 12/12/2022 at 7:55 pm to provider [facility] , who verbally acknowledged these results. Electronically Signed   By: San Morelle M.D.   On: 12/12/2022 19:55   DG Chest  Portable 1 View  Result Date: 12/12/2022 CLINICAL DATA:  Altered mental status. EXAM: PORTABLE CHEST 1 VIEW COMPARISON:  Chest radiograph dated October 21, 2022 FINDINGS: The heart is enlarged. Mild pulmonary vascular congestion. Right IJ access dialysis catheter with  distal tip in the right atrium, unchanged. Right axillary vascular stent is also unchanged. Low lung volumes without evidence of focal consolidation or pleural effusion. Thoracic spondylosis. IMPRESSION: 1. Cardiomegaly with mild pulmonary vascular congestion. 2. Low lung volumes without evidence of focal consolidation or pleural effusion. Electronically Signed   By: Keane Police D.O.   On: 12/12/2022 15:14   CT Head Wo Contrast  Result Date: 12/12/2022 CLINICAL DATA:  Altered mental status. Nontraumatic. Patient went to bed last night around midnight normally, woke up this a.m. at 3 and was not able to talk EXAM: CT HEAD WITHOUT CONTRAST TECHNIQUE: Contiguous axial images were obtained from the base of the skull through the vertex without intravenous contrast. RADIATION DOSE REDUCTION: This exam was performed according to the departmental dose-optimization program which includes automated exposure control, adjustment of the mA and/or kV according to patient size and/or use of iterative reconstruction technique. COMPARISON:  CT examination dated October 26, 223 FINDINGS: Brain: No evidence of acute infarction, hemorrhage, hydrocephalus, extra-axial collection or mass lesion/mass effect. Low attenuation in the left paramedian frontoparietal region suggesting recent infarct. 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 Diffuse low-attenuation of the periventricular white matter presumed chronic microvascular ischemic changes. Vascular: No hyperdense vessel or unexpected calcification. Skull: Normal. Negative for fracture or focal lesion. Sinuses/Orbits: No acute finding. Other: None. IMPRESSION: 1. No acute intracranial hemorrhage, mass effect or midline shift. MRI examination for further evaluation is suggested if clinically warranted. 2. Low attenuation in the left paramedian frontoparietal region suggesting recent infarct, similar to prior examination. 3. Encephalomalacia of  the right cerebellum suggesting chronic infarcts, unchanged. 4. Advanced chronic microvascular ischemic changes of the white matter, unchanged. Electronically Signed   By: Keane Police D.O.   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 Speech was absent. She was able to follow only simple commands intermittently.  Cranial Nerves: II:  Visual fields unable to be tested III,IV, VI: no ptosis, extra-ocular motions unable to be tested V,VII: patient does not smile when prompted, facial light touch sensation unable to be assessed XII: midline tongue extension without atrophy and without fasciculations  Motor: Generalized weakness noted throughout, though likely related to confusion, inattention  Tone and bulk: normal tone throughout; no atrophy noted  Sensory: unable to be assessed  Cerebellar: Unable to assess  Gait: not observed during encounter  ASSESSMENT/PLAN GREGORIA SELVY is a 51 y.o. 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  Found to have endocarditis at that time and from a stroke prevention standpoint recommended aspirin only.  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 She comes back again to the emergency room on 12/12/2022 after being found down and altered at home. MRI of the brain shows new ischemic infarcts when compared to October.  #Acute metabolic encephalopathy  #Acute/subacute R occipital non-hemorrhagic strokes #Prior posterior L cerebellar infarct #Prior L frontal parietal cortex non-hemorrhagic stroke Code Stroke CT head No acute intracranial hemorrhage, mass effect or midline shift. Low attenuation in the left paramedian frontoparietal region suggesting recent infarct, similar to prior examination. 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 Advanced chronic microvascular ischemic changes of the white matter, unchanged. MRI shows 2 foci of restricted diffusion within the subcortical white matter of the right occipital pole consistent with acute/subacute nonhemorrhagic infarcts. 9 mm posterior left cerebellar infarct is new since the prior MRI, but not acute. Expected evolution of nonhemorrhagic infarct in the medial left frontal parietal cortex. Moderate generalized atrophy and advanced white matter disease likely reflects the sequela of chronic microvascular ischemia. 2D Echo EF 60-65% with mild LVH, notable highly mobile mass on posterior MV measuring 1.9 cm x 0.5 cm  LDL pending HgbA1c 7.4 VTE prophylaxis - heparin subq    Diet   Diet NPO time specified   No antithrombotic prior to admission, now on aspirin 300 mg suppository daily. After cleared by SLP, switch  81 mg and clopidogrel 75 mg daily for 21 days, then clopidogrel 75 mg alone daily indefinitely EEG negative for seizures, ordered overnight EEG given continued encephalopathy Therapy recommendations:  pending medical improvement Disposition:  pending medical clearance, stroke workup  Hypertension Home meds:  amlodipine Stable Permissive hypertension (OK if < 220/120) but gradually normalize in 5-7 days Long-term BP goal  normotensive  Hyperlipidemia Home meds: rosuvastatin 10 mg LDL pending, goal < 70  Diabetes type II Uncontrolled Home meds:  insulin HgbA1c 7.4, goal < 7.0 CBGs Recent Labs    12/12/22 1318  GLUCAP 197*    SSI  Other Stroke Risk Factors Obesity, There is no height or weight on file to calculate BMI., BMI >/= 30 associated with increased stroke risk, recommend weight loss, diet and exercise as appropriate   Other Active Problems PVD ESRD on dialysis  Anemia in chronic kidney disease, on chronic dialysis  Hospital day # 0  I have personally obtained history,examined this patient, reviewed notes, independently viewed imaging studies, participated in medical decision making and plan of care.ROS completed by me personally and pertinent positives fully documented  I have made any additions or clarifications directly to the above note. Agree with note above.  Patient presented with somnolence and altered mental status.MRI scan shows small new right occipital and left cerebellar infarcts with 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  Neurological exam seems out of proportion to the new strokes hence look for reversible causes of encephalopathy and be treated.  Treatment of endocarditis as per primary team.  Discussed with husband at the bedside and answered questions.  Recommend aspirin and Plavix for 3 weeks and then aspirin alone.  Aggressive risk factor modification.  Greater than 50% time during this 50-minute visit were spent in counseling and coordination of care and discussion with patient and her husband and answering questions.  Discussed with Dr.Ghimire  Antony Contras, MD Medical Director [facility] Pager: [phone] 12/13/2022 5:57 PM To contact Stroke Continuity provider, please refer to [email or web address] After hours, contact General Neurology

## 2022-12-13 NOTE — Progress Notes (Signed)
PROGRESS NOTE    Heather [name]  GMW:[id number] DOB: [birth year] DOA: 12/12/2022 PCP: [name], MD    Brief Narrative:  51 year old with extensive medical issues including send history of cardiac arrest, stroke, ESRD on hemodialysis Monday Wednesday Friday, insulin-dependent type 2 diabetes with gastroparesis, PE and DVT on Eliquis, hypertension, right below-knee amputation who was brought from home with altered mental status and aphasia.  Husband found her at home very lethargic and confused and weak.  She had also taken a dose of baclofen in the evening. 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  Taken off Eliquis with risk of hemorrhagic conversion. Hospitalization 10/21/2022-10/24/2022 with acute infarct left paramedian frontoparietal region.  At the emergency room afebrile, blood pressure 250/124.  No leukocytosis.  CT scan of the head with left paramedian frontal parietal region infarct.  MRI showed new 9 mm posterior left cerebral infarct.  Admitted with acute metabolic encephalopathy, acute to stroke.  Assessment & Plan:   Acute ischemic stroke left MCA territory: Clinical findings, altered mental status and aphasia. CT head findings, acute infarct. MRI of the brain, 9 mm new left cerebral infarct.  Previous ischemia. Carotid Doppler, recently done.  Suggested not to repeat. 2D echocardiogram, ordered.  Pending today. Antiplatelet therapy, currently on aspirin.  Recommended Eliquis with history of PE DVT.  Neurology recommended to check on echocardiogram before restarting Eliquis. LDL pending. Hemoglobin A1c, 6.2.  Repeat pending. PT/OT/speech. Permissive hypertension.  Acute metabolic encephalopathy: Likely due to stroke.  Also took baclofen doses.  Continue to monitor.  Dialysis today. EEG pending. Rule out recurrent infection, draw blood cultures today.  Echocardiogram as above.  ESRD on  hemodialysis: Due for dialysis today.  Nephrology consulted.  Essential hypertension: Permissive hypertension today.  Control blood pressure to keep more than 180.  Type 2 diabetes with ESRD: Sliding scale insulin.  Well-controlled.   DVT prophylaxis: heparin injection 5,000 Units Start: 12/12/22 2230   Code Status: Full code Family Communication: Husband at the bedside Disposition Plan: Status is: Observation The patient will require care spanning > 2 midnights and should be moved to inpatient because: Significant debility, aphasia and unable to eat.     Consultants:  Neurology  Procedures:  EEG  Antimicrobials:  None   Subjective: Patient seen in the morning rounds.  Husband was at the bedside.  Patient was unable to comprehend any commands.  She will not follow commands but look on the opposite side.  Husband reported far away from baseline.  Objective: Vitals:   12/13/22 0130 12/13/22 0545 12/13/22 0700 12/13/22 1100  BP: (!) 159/107 (!) 188/112 (!) 190/102 (!) 130/17  Pulse: 85 88 85 93  Resp: 13 10 (!) 8 20  Temp:   98 F (36.7 C) 97.9 F (36.6 C)  TempSrc:      SpO2: 100% 99% 96% 100%   No intake or output data in the 24 hours ending 12/13/22 1157 There were no vitals filed for this visit.  Examination:  General exam: Appears lethargic.  Chronically sick looking but not in any distress. Difficulty following commands. Mostly sleepy. She does have spontaneous movements of all extremities, however not comprehending any. Respiratory system: Clear to auscultation. Respiratory effort normal. Right chest wall permacath. Cardiovascular system: S1 & S2 heard, RRR.  Gastrointestinal system: Abdomen is nondistended, soft and nontender. No organomegaly or masses felt. Normal bowel sounds heard.   Data Reviewed: I have personally reviewed following labs and imaging studies  CBC: Recent  Labs  Lab 12/12/22 1356 12/12/22 1419  WBC 6.6  --   NEUTROABS 4.1  --    HGB 10.4* 13.3  HCT 34.2* 39.0  MCV 97.7  --   PLT 133*  --    Basic Metabolic Panel: Recent Labs  Lab 12/12/22 1356 12/12/22 1419  NA 137 137  K 3.9 4.3  CL 98 101  CO2 27  --   GLUCOSE 187* 191*  BUN 39* 40*  CREATININE 10.31* 10.90*  CALCIUM 9.5  --    GFR: Estimated Creatinine Clearance: 7.6 mL/min (A) (by C-G formula based on SCr of 10.9 mg/dL (H)). Liver Function Tests: Recent Labs  Lab 12/12/22 1356  AST 24  ALT 61*  ALKPHOS 108  BILITOT 0.4  PROT 7.9  ALBUMIN 3.4*   No results for input(s): "LIPASE", "AMYLASE" in the last 168 hours. Recent Labs  Lab 12/12/22 1356  AMMONIA 24   Coagulation Profile: No results for input(s): "INR", "PROTIME" in the last 168 hours. Cardiac Enzymes: No results for input(s): "CKTOTAL", "CKMB", "CKMBINDEX", "TROPONINI" in the last 168 hours. BNP (last 3 results) No results for input(s): "PROBNP" in the last 8760 hours. HbA1C: No results for input(s): "HGBA1C" in the last 72 hours. CBG: Recent Labs  Lab 12/12/22 1318 12/13/22 0907  GLUCAP 197* 167*   Lipid Profile: No results for input(s): "CHOL", "HDL", "LDLCALC", "TRIG", "CHOLHDL", "LDLDIRECT" in the last 72 hours. Thyroid Function Tests: No results for input(s): "TSH", "T4TOTAL", "FREET4", "T3FREE", "THYROIDAB" in the last 72 hours. Anemia Panel: No results for input(s): "VITAMINB12", "FOLATE", "FERRITIN", "TIBC", "IRON", "RETICCTPCT" in the last 72 hours. Sepsis Labs: No results for input(s): "PROCALCITON", "LATICACIDVEN" in the last 168 hours.  Recent Results (from the past 240 hour(s))  Resp panel by RT-PCR (RSV, Flu A&B, Covid) Anterior Nasal Swab     Status: None   Collection Time: 12/12/22  1:57 PM   Specimen: Anterior Nasal Swab  Result Value Ref Range Status   SARS Coronavirus 2 by RT PCR NEGATIVE NEGATIVE Final    Comment: (NOTE) SARS-CoV-2 target nucleic acids are NOT DETECTED.  The SARS-CoV-2 RNA is generally detectable in upper  respiratory specimens during the acute phase of infection. The lowest concentration of SARS-CoV-2 viral copies this assay can detect is 138 copies/mL. A negative result does not preclude SARS-Cov-2 infection and should not be used as the sole basis for treatment or other patient management decisions. A negative result may occur with  improper specimen collection/handling, submission of specimen other than nasopharyngeal swab, presence of viral mutation(s) within the areas targeted by this assay, and inadequate number of viral copies(<138 copies/mL). A negative result must be combined with clinical observations, patient history, and epidemiological information. The expected result is Negative.  Fact Sheet for Patients:  EntrepreneurPulse.com.au  Fact Sheet for Healthcare Providers:  IncredibleEmployment.be  This test is no t yet approved or cleared by the [place] FDA and  has been authorized for detection and/or diagnosis of SARS-CoV-2 by FDA under an Emergency Use Authorization (EUA). This EUA will remain  in effect (meaning this test can be used) for the duration of the COVID-19 declaration under Section 564(b)(1) of the Act, 21 U.S.C.section 360bbb-3(b)(1), unless the authorization is terminated  or revoked sooner.       Influenza A by PCR NEGATIVE NEGATIVE Final   Influenza B by PCR NEGATIVE NEGATIVE Final    Comment: (NOTE) The Xpert Xpress SARS-CoV-2/FLU/RSV plus assay is intended as an aid in the diagnosis of influenza  from Nasopharyngeal swab specimens and should not be used as a sole basis for treatment. Nasal washings and aspirates are unacceptable for Xpert Xpress SARS-CoV-2/FLU/RSV testing.  Fact Sheet for Patients: EntrepreneurPulse.com.au  Fact Sheet for Healthcare Providers: IncredibleEmployment.be  This test is not yet approved or cleared by the [place] FDA and has been  authorized for detection and/or diagnosis of SARS-CoV-2 by FDA under an Emergency Use Authorization (EUA). This EUA will remain in effect (meaning this test can be used) for the duration of the COVID-19 declaration under Section 564(b)(1) of the Act, 21 U.S.C. section 360bbb-3(b)(1), unless the authorization is terminated or revoked.     Resp Syncytial Virus by PCR NEGATIVE NEGATIVE Final    Comment: (NOTE) Fact Sheet for Patients: EntrepreneurPulse.com.au  Fact Sheet for Healthcare Providers: IncredibleEmployment.be  This test is not yet approved or cleared by the [place] FDA and has been authorized for detection and/or diagnosis of SARS-CoV-2 by FDA under an Emergency Use Authorization (EUA). This EUA will remain in effect (meaning this test can be used) for the duration of the COVID-19 declaration under Section 564(b)(1) of the Act, 21 U.S.C. section 360bbb-3(b)(1), unless the authorization is terminated or revoked.  Performed at [facility], [place] [address] [place], [place] [address]          Radiology Studies: EEG adult  Result Date: 01/01/23 Lora Havens, MD     2023/01/01  9:32 AM Patient Name: SHAWN DANNENBERG MRN: [id number] Epilepsy Attending: Lora Havens Referring Physician/Provider: Amie Portland, MD Date: January 01, 2023 Duration: 21.38 mins Patient history: 51 year old female with altered mental status.  EEG to evaluate for seizure. Level of alertness: Awake AEDs during EEG study: None Technical aspects: This EEG study was done with scalp electrodes positioned according to the 10-20 International system of electrode placement. Electrical activity was reviewed with band pass filter of 1-'70Hz'$ , sensitivity of 7 uV/mm, display speed of 47m/sec with a '60Hz'$  notched filter applied as appropriate. EEG data were recorded continuously and digitally stored.  Video monitoring was available and reviewed as appropriate.  Description: No clear posterior dominant rhythm was seen.  EEG showed continuous generalized 3-5 Hz theta-delta slowing. Generalized periodic discharges with triphasic morphology were also noted at 1 to 1.5 Hz.  Hyperventilation and photic stimulation were not performed.   ABNORMALITY - Periodic discharges with triphasic morphology, generalized ( GPDs) - Continuous slow, generalized IMPRESSION: This study showed generalized periodic discharges with triphasic morphology which can be on the ictal-interictal continuum.  However the frequency and morphology is more commonly indicative of toxic -metabolic causes. Additionally there is moderate diffuse encephalopathy, nonspecific etiology. No seizures were seen throughout the recording. PLora Havens  MR BRAIN WO CONTRAST  Result Date: 12/12/2022 CLINICAL DATA:  Altered mental status.  Aphasia. EXAM: MRI HEAD WITHOUT CONTRAST TECHNIQUE: Multiplanar, multiecho pulse sequences of the brain and surrounding structures were obtained without intravenous contrast. COMPARISON:  CT head without contrast 12/12/2022. MR head without contrast 10/21/2022. FINDINGS: Brain: 2 foci of restricted diffusion are present within the subcortical white matter of the right occipital pole. The larger measures 9 mm. T2 and FLAIR signal changes are associated with the infarcts. No other acute infarcts are present. Moderate generalized atrophy and advanced diffuse confluent periventricular and scattered subcortical T2 hyperintensities bilaterally are stable. A remote right cerebellar infarct is stable. A posterior left cerebellar infarct measures 9 mm. This is new since the prior MRI but not acute. Expected evolution of nonhemorrhagic infarct in the  medial left frontal parietal cortex is noted. The ventricles are proportionate to the degree of atrophy. No significant extraaxial fluid collection is present. White matter changes extending into the brainstem are stable. Vascular: Flow is  present in the major intracranial arteries. Skull and upper cervical spine: The craniocervical junction is normal. Upper cervical spine is within normal limits. Marrow signal is unremarkable. Sinuses/Orbits: The paranasal sinuses and mastoid air cells are clear. Bilateral lens replacements are noted. Exophthalmos is stable. Globes and orbits are otherwise unremarkable. IMPRESSION: 1. 2 foci of restricted diffusion within the subcortical white matter of the right occipital pole consistent with acute/subacute nonhemorrhagic infarcts. 2. 9 mm posterior left cerebellar infarct is new since the prior MRI, but not acute 3. Expected evolution of nonhemorrhagic infarct in the medial left frontal parietal cortex. 4. Moderate generalized atrophy and advanced white matter disease likely reflects the sequela of chronic microvascular ischemia. These results were called by telephone at the time of interpretation on 12/12/2022 at 7:55 pm to provider [facility] , who verbally acknowledged these results. Electronically Signed   By: San Morelle M.D.   On: 12/12/2022 19:55   DG Chest Portable 1 View  Result Date: 12/12/2022 CLINICAL DATA:  Altered mental status. EXAM: PORTABLE CHEST 1 VIEW COMPARISON:  Chest radiograph dated October 21, 2022 FINDINGS: The heart is enlarged. Mild pulmonary vascular congestion. Right IJ access dialysis catheter with distal tip in the right atrium, unchanged. Right axillary vascular stent is also unchanged. Low lung volumes without evidence of focal consolidation or pleural effusion. Thoracic spondylosis. IMPRESSION: 1. Cardiomegaly with mild pulmonary vascular congestion. 2. Low lung volumes without evidence of focal consolidation or pleural effusion. Electronically Signed   By: Keane Police D.O.   On: 12/12/2022 15:14   CT Head Wo Contrast  Result Date: 12/12/2022 CLINICAL DATA:  Altered mental status. Nontraumatic. Patient went to bed last night around midnight normally, woke up  this a.m. at 3 and was not able to talk EXAM: CT HEAD WITHOUT CONTRAST TECHNIQUE: Contiguous axial images were obtained from the base of the skull through the vertex without intravenous contrast. RADIATION DOSE REDUCTION: This exam was performed according to the departmental dose-optimization program which includes automated exposure control, adjustment of the mA and/or kV according to patient size and/or use of iterative reconstruction technique. COMPARISON:  CT examination dated October 26, 223 FINDINGS: Brain: No evidence of acute infarction, hemorrhage, hydrocephalus, extra-axial collection or mass lesion/mass effect. Low attenuation in the left paramedian frontoparietal region suggesting recent infarct. 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 Diffuse low-attenuation of the periventricular white matter presumed chronic microvascular ischemic changes. Vascular: No hyperdense vessel or unexpected calcification. Skull: Normal. Negative for fracture or focal lesion. Sinuses/Orbits: No acute finding. Other: None. IMPRESSION: 1. No acute intracranial hemorrhage, mass effect or midline shift. MRI examination for further evaluation is suggested if clinically warranted. 2. Low attenuation in the left paramedian frontoparietal region suggesting recent infarct, similar to prior examination. 3. 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 4. Advanced chronic microvascular ischemic changes of the white matter, unchanged. Electronically Signed   By: Keane Police D.O.   On: 12/12/2022 14:18        Scheduled Meds:  aspirin EC  81 mg Oral Daily   Chlorhexidine Gluconate Cloth  6 each Topical Q0600   clopidogrel  75 mg Oral Daily   heparin  5,000 Units Subcutaneous Q8H   insulin aspart  0-15 Units Subcutaneous TID PC & HS  Continuous Infusions:   LOS: 0 days    Time spent: 35 minutes    Barb Merino, MD [facility] Pager  [phone]

## 2022-12-13 NOTE — Progress Notes (Signed)
Pharmacy Antibiotic Note  Heather [name] is a 51 y.o. female admitted on 12/12/2022 with bacteremia concern for endocarditis, hx of endocarditis 09/2022.  Pharmacy has been consulted for vancomycin and zosyn dosing.  Patient ESRD, normal HD schedule MWF. Patient last received dialysis this afternoon.   Plan: IV vancomycin '2250mg'$  x1 load, then '1000mg'$  following HD.  Zosyn 2.25 Q8H.  Follow culture data for de-escalation.  Monitor renal function for dose adjustments as indicated.     Temp (24hrs), Avg:97.8 F (36.6 C), Min:97.5 F (36.4 C), Max:98 F (36.7 C)  Recent Labs  Lab 12/12/22 1356 12/12/22 1419  WBC 6.6  --   CREATININE 10.31* 10.90*    Estimated Creatinine Clearance: 7.6 mL/min (A) (by C-G formula based on SCr of 10.9 mg/dL (H)).    Allergies  Allergen Reactions   Ibuprofen Other (See Comments)    CKD stage 3. Should avoid.   Dilaudid [Hydromorphone Hcl] Itching and Other (See Comments)    Can take with Benadryl.   Iodinated Contrast Media Rash   Tramadol Itching   Thank you for allowing pharmacy to be a part of this patient's care.  Heather [name], PharmD, BCCCP  12/13/2022 6:20 PM

## 2022-12-13 NOTE — Consult Note (Signed)
Neurology Consultation  Reason for Consult: Stroke, Altered mental status Referring Physician: Dr. Gilford Raid  CC: Altered mental status, stroke  History is obtained from: Chart  HPI: Heather [name] is a 51 y.o. 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  Found to have endocarditis at that time and from a stroke prevention standpoint recommended aspirin only.  She was on Eliquis due to DVT/PE history which was discontinued after the infective endocarditis. She comes back again to the emergency room on 12/12/2022 after being found down and altered at home.  This was presumably not witnessed.  Unclear last known well.  MRI of the brain was done that revealed new ischemic infarcts when compared to October and patient was admitted for further workup, neurological consultation.  IV antibiotics in October were started-cefepime and vancomycin and stopped 11/12/2022.  Unclear if she was compliant of the whole course was completed as there is no family member to provide history at this time of my examination.  Going by the HPI and the H&P, the husband provided history that she went to bed normal the evening prior to presentation meaning sometime on 12/11/2022 and then around 3 AM the husband found her out of the wheelchair saying she was ready for church.  He then got her into bed and she had difficulty transferring herself from the wheelchair which is not normal for her.  She remained confused in the morning and also had an episode of vomiting.  She did take 5 mg of baclofen twice the night prior after having not taken that  medication for a while because she was having leg spasms.   LKW: Sometime in the evening of 12/11/2022 IV thrombolysis given?: no, recent strokes and outside the window EVT: Poor modified Rankin Premorbid modified Rankin scale (mRS):4-5   ROS:  Unable to obtain due to altered mental status.   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[place]), nonhemorrhagic, inferior right cerebellum 12/03/2017   left side weakness in leg   Cyst of left ovary 01/12/2018   Diabetic neuropathy, painful ([place]), on low dose Gabapentin 07/13/2013   DM (diabetes mellitus), type 2, uncontrolled, with renal complications 75/64/3329   DM2 (diabetes mellitus, type 2) ([place])    Dysfunction of left eustachian tube, with pusatile tinnitus 11/24/2017   Dyslipidemia associated with type 2 diabetes mellitus ([place]) 06/25/2016   ESRD (end stage renal disease) on dialysis [facility])    On hemodialysis in July 2019 via [facility] then switched to [facility] in Nov 2019.    ESRD with anemia ([facility])    Fibromyalgia    Gastroparesis due to DM    GERD (gastroesophageal reflux disease)    Headache    Hypertension associated with diabetes ([place]) 02/19/2011   IBS (irritable bowel syndrome)    Left-sided weakness 12/10/2017   .   Leiomyoma of uterus    Pancreatitis    PE (pulmonary thromboembolism) ([place]) 11/04/2019   Pneumonia  Proliferative diabetic retinopathy ([place]) 09/29/2015   Pronation deformity of both feet 06/14/2014   Reactive depression 12/10/2017   .   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[place]) 02/17/2018   Thromboembolism ([place]) 04/05/2018   Upper airway cough syndrome, with recs to stay off ACE and take Pepcid q hs  11/15/2016   Urticaria     Family History  Problem Relation Age of Onset   Colon polyps Mother    Diabetes Mother    Heart murmur Father        history essentially unknown   Hypertension Father    Diabetes Sister    Kidney failure Sister        kidney transplant   Diabetes Brother    Retinal degeneration Brother    Heart murmur Son    Stroke Paternal Grandmother    Diabetes Sister    Social History:   reports that she has never smoked. She has never been exposed to tobacco smoke. She has never used smokeless tobacco. She reports that she does not drink alcohol and does not use drugs.  Medications  Current Facility-Administered Medications:     stroke: early stages of recovery book, , Does not apply, Once, Tu, Ching T, DO   acetaminophen (TYLENOL) tablet 650 mg, 650 mg, Oral, Q4H PRN **OR** acetaminophen (TYLENOL) 160 MG/5ML solution 650 mg, 650 mg, Per Tube, Q4H PRN **OR** acetaminophen (TYLENOL) suppository 650 mg, 650 mg, Rectal, Q4H PRN, Tu, Ching T, DO   heparin injection 5,000 Units, 5,000 Units, Subcutaneous, Q8H, Tu, Ching T, DO, 5,000 Units at 12/12/22 2243   insulin aspart (novoLOG) injection 0-15 Units, 0-15 Units, Subcutaneous, TID PC & HS, Tu, Ching T, DO   labetalol (NORMODYNE) injection 10 mg, 10 mg, Intravenous, Q2H PRN, Tu, Ching T, DO  Current Outpatient Medications:    amLODipine-olmesartan (AZOR) 5-20 MG tablet, Take 1 tablet by mouth daily., Disp: 90 tablet, Rfl: 0   aspirin 81 MG chewable tablet, Chew 1 tablet (81 mg total) by mouth daily., Disp: 30 tablet, Rfl: 0   azelastine (ASTELIN) 0.1 % nasal spray, Place 2 sprays into both nostrils 2 (two) times daily., Disp: 30 mL, Rfl: 12   calcium acetate (PHOSLO) 667 MG capsule, Take 2,001 mg by mouth 3 (three) times daily with meals., Disp: , Rfl:    ceFEPIme 2 g in sodium chloride 0.9 % 100 mL, Inject 2 g into the vein every Monday, Wednesday, and Friday at 8 PM., Disp: , Rfl:    clindamycin (CLEOCIN T) 1 %  SWAB, Apply 1 Application topically 3 (three) times a week., Disp: , Rfl:    Continuous Blood Gluc Sensor (FREESTYLE LIBRE 14 DAY SENSOR) MISC, 1 patch by Does not apply route every 14 (fourteen) days., Disp: 6 each, Rfl: 5   cromolyn (OPTICROM) 4 % ophthalmic solution, Place 1 drop into both eyes 4 (four) times daily as needed., Disp: 10 mL, Rfl: 3   DIFLUCAN 100 MG tablet, Take by mouth., Disp: , Rfl:    Doxercalciferol (HECTOROL IV), Doxercalciferol (Hectorol), Disp: , Rfl:    fluticasone (FLONASE) 50 MCG/ACT nasal spray, Place 2 sprays into both nostrils daily., Disp: 18.2 mL, Rfl: 2   gabapentin (NEURONTIN) 300 MG capsule, Take 1 capsule (300 mg total) by mouth every Monday, Wednesday, and Friday. At 10:30 am, Disp: 30 capsule, Rfl: 3   insulin aspart (NOVOLOG FLEXPEN) 100 UNIT/ML FlexPen, Inject 6 Units into  the skin 3 (three) times daily with meals. Sliding scale, Disp: 15 mL, Rfl:    insulin degludec (TRESIBA FLEXTOUCH) 100 UNIT/ML FlexTouch Pen, Inject 20 Units into the skin at bedtime. (Patient taking differently: Inject 26 Units into the skin at bedtime.), Disp: 24 mL, Rfl: 3   Insulin Pen Needle 29G X 5MM MISC, 1 Device by Does not apply route 4 (four) times daily., Disp: 400 each, Rfl: 3   ketoconazole (NIZORAL) 2 % cream, Apply 1 Application topically 2 (two) times a week., Disp: , Rfl:    loratadine (CLARITIN) 10 MG tablet, Take 10 mg by mouth daily., Disp: , Rfl:    Methoxy PEG-Epoetin Beta (MIRCERA IJ), Mircera, Disp: , Rfl:    Olopatadine HCl (PATADAY) 0.2 % SOLN, Apply 1 drop to eye daily. (Patient taking differently: Apply 1 drop to eye daily as needed (dry eyes).), Disp: 2.5 mL, Rfl: 3   pantoprazole (PROTONIX) 40 MG tablet, Take 1 tablet (40 mg total) by mouth daily., Disp: 30 tablet, Rfl: 3   rosuvastatin (CRESTOR) 10 MG tablet, Take 1 tablet (10 mg total) by mouth daily., Disp: 30 tablet, Rfl: 0   triamcinolone ointment (KENALOG) 0.1 %, Apply topically twice daily to BODY as  needed for red, sandpaper like rash.  Do not use on face, groin or armpits., Disp: 80 g, Rfl: 1   vancomycin (VANCOCIN) 1-5 GM/200ML-% SOLN, Inject 200 mLs (1,000 mg total) into the vein every Monday, Wednesday, and Friday with hemodialysis., Disp: 4000 mL, Rfl:    Exam: Current vital signs: BP (!) 174/101   Pulse 80   Temp (!) 97.5 F (36.4 C) (Oral)   Resp (!) 9   LMP 09/10/2021   SpO2 100%  Vital signs in last 24 hours: Temp:  [97.4 F (36.3 C)-98.2 F (36.8 C)] 97.5 F (36.4 C) (12/17 2205) Pulse Rate:  [78-94] 80 (12/17 2300) Resp:  [7-26] 9 (12/17 2300) BP: (141-254)/(86-124) 174/101 (12/17 2300) SpO2:  [92 %-100 %] 100 % (12/17 2300) General: Drowsy, in no distress HEENT: Normocephalic atraumatic Lungs: Clear Cardiovascular: Regular rhythm Abdomen nondistended nontender Extremities: Right BKA. Neuro exam Drowsy, opens eyes to voice. Follows simple commands Poor attention concentration Minimal verbal output Follows simple commands such as raising her arms up but when I asked her to raise her leg up, she continues to keep raising her arms up and then putting them down. Cranial nerves: Pupils equal round react light, extraocular movements appear unhindered, face appears symmetric, tongue and palate appear midline. Motor examination with antigravity strength in both upper extremities.  There is antigravity strength in the left lower extremity as well but she has poor attention concentration to follow commands for me to accurately assess. Sensation: Intact as evidenced by grimace to noxious stimulation which was equal all over Coordination difficult to assess given mentation NIHSS 1a Level of Conscious.: 1 1b LOC Questions: 2 1c LOC Commands: 1 2 Best Gaze: 0 3 Visual: 0 4 Facial Palsy: 0 5a Motor Arm - left: 0 5b Motor Arm - Right: 0 6a Motor Leg - Left: 2 6b Motor Leg - Right: 2 7 Limb Ataxia: 0 8 Sensory: 0 9 Best Language: 2 10 Dysarthria: 0 11 Extinct. and  Inatten.: 0 TOTAL: 10   Labs I have reviewed labs in epic and the results pertinent to this consultation are:  CBC    Component Value Date/Time   WBC 6.6 12/12/2022 1356   RBC 3.50 (L) 12/12/2022 1356   HGB 13.3 12/12/2022 1419  HCT 39.0 12/12/2022 1419   PLT 133 (L) 12/12/2022 1356   MCV 97.7 12/12/2022 1356   MCH 29.7 12/12/2022 1356   MCHC 30.4 12/12/2022 1356   RDW 15.0 12/12/2022 1356   LYMPHSABS 1.5 12/12/2022 1356   MONOABS 0.5 12/12/2022 1356   EOSABS 0.5 12/12/2022 1356   BASOSABS 0.0 12/12/2022 1356    CMP     Component Value Date/Time   NA 137 12/12/2022 1419   NA 133 (A) 03/20/2019 0000   K 4.3 12/12/2022 1419   K 4.3 10/16/2013 0000   CL 101 12/12/2022 1419   CL 100 10/16/2013 0000   CO2 27 12/12/2022 1356   GLUCOSE 191 (H) 12/12/2022 1419   BUN 40 (H) 12/12/2022 1419   BUN 41 (A) 03/20/2019 0000   CREATININE 10.90 (H) 12/12/2022 1419   CREATININE 3.05 (H) 09/03/2015 0855   CALCIUM 9.5 12/12/2022 1356   CALCIUM 8.9 10/16/2013 0000   PROT 7.9 12/12/2022 1356   PROT 132.5 03/19/2013 0000   ALBUMIN 3.4 (L) 12/12/2022 1356   ALBUMIN 3.1 10/16/2013 0000   AST 24 12/12/2022 1356   AST 7 03/19/2013 0000   ALT 61 (H) 12/12/2022 1356   ALKPHOS 108 12/12/2022 1356   ALKPHOS 89 03/19/2013 0000   BILITOT 0.4 12/12/2022 1356   BILITOT 0.1 03/19/2013 0000   GFRNONAA 4 (L) 12/12/2022 1356   GFRNONAA 18 (L) 09/03/2015 0855   GFRAA 4 (L) 05/07/2020 0819   GFRAA 20 (L) 09/03/2015 0855   Ammonia normal  Imaging I have reviewed the images obtained: MRI brain: Reveals 2 foci of restricted diffusion within the subcortical white matter of the right occipital pole consistent with acute/subacute nonhemorrhagic infarct.  There is a 9 mm posterior left cerebellar infarct which is new since the prior MRI but not acute.  Expected evolution of the nonhemorrhagic infarct in the medial left frontal parietal cortex.  Moderate generalized atrophy and advanced white matter  disease likely reflects sequela of chronic microvascular ischemia.  MR angio head October 2023: No ELVO.  Positive for progressive intracranial atherosclerosis since 2019 with new moderate to severe left M2 and moderate right M2 origin stenosis which are new from prior MRI.  Up to moderate bilateral supraclinoid ICA stenosis also appears increased on the right.  Carotid Dopplers October 2023 with bilateral carotid 1 to 39% stenosis, bilateral vertebral arteries with antegrade flow.  Chest x-ray with low lung volumes, cardiomegaly and mild vascular pulmonary congestion.  Assessment:  51 year old past history of ESRD diabetes hypertension DVT PE was on Eliquis up until October which was stopped because she had a stroke in the left frontal area with right-sided hemiparesis which had resolved, multiple prior strokes with no residual deficits, right BKA, found to have infective endocarditis for which she should have completed antibiotics on 11/12/2022, coming in for evaluation of altered mental status and imaging reveals new strokes, this time in the right occipital lobe whereas prior strokes were noted in the left frontal area. I not able to contact family and unsure of her compliance to medications. The appearance of the strokes looks embolic. Her Eliquis was stopped because of the infective endocarditis and she was discharged home on aspirin in October. I would like to recommend getting repeat echocardiogram to ensure resolution of the vegetation prior to making any recommendations on anticoagulation. That said, she has worsening of her renal function which can likely contribute to the altered mental status amongst other reasons such as a new stroke.She will need repeat  modified stroke workup as well as toxic metabolic encephalopathy workup.  Given prior strokes, seizures could also be in the differentials for altered mental status.  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 I would recommend repeat 2D echocardiogram Unclear last known well-continue with normotension as a BP goal. Once the echocardiogram results are available, discussions will need to be had with cardiology as well regarding anticoagulation. Check lipid panel-LDL was not calculated in the lipid panel from October. A1c was 7.4 in October.  Recheck A1c.  Goal less than 7. Routine EEG Check TSH, B12 Management of toxic metabolic derangements including renal dysfunction per primary team. Check urine if still producing urine. Check bilateral lower extremity Dopplers to evaluate for new or worsening DVT  Stroke team to follow Preliminary plan was discussed with Dr. Gilford Raid   -- Amie Portland, MD Neurologist [facility] Pager: [phone]

## 2022-12-13 NOTE — ED Notes (Signed)
I didn't have any success getting patient blood. 

## 2022-12-13 NOTE — Progress Notes (Signed)
EEG complete - results pending 

## 2022-12-13 NOTE — Evaluation (Signed)
Clinical/Bedside Swallow Evaluation Patient Details  Name: Heather [name] MRN: [id number] Date of Birth: 09-27-1971  Today's Date: 12/13/2022 Time: SLP Start Time (ACUTE ONLY): 1645 SLP Stop Time (ACUTE ONLY): 3382 SLP Time Calculation (min) (ACUTE ONLY): 10 min  Past Medical History:  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[place]), nonhemorrhagic, inferior right cerebellum 12/03/2017   left side weakness in leg   Cyst of left ovary 01/12/2018   Diabetic neuropathy, painful ([place]), on low dose Gabapentin 07/13/2013   DM (diabetes mellitus), type 2, uncontrolled, with renal complications 50/53/9767   DM2 (diabetes mellitus, type 2) ([place])    Dysfunction of left eustachian tube, with pusatile tinnitus 11/24/2017   Dyslipidemia associated with type 2 diabetes mellitus ([place]) 06/25/2016   ESRD (end stage renal disease) on dialysis [facility])    On hemodialysis in July 2019 via [facility] then switched to [facility] in Nov 2019.    ESRD with anemia (H[facility])    Fibromyalgia    Gastroparesis due to DM    GERD (gastroesophageal reflux disease)    Headache    Hypertension associated with diabetes ([place]) 02/19/2011   IBS (irritable bowel syndrome)    Left-sided weakness 12/10/2017   .   Leiomyoma of uterus    Pancreatitis    PE (pulmonary thromboembolism) ([place]) 11/04/2019   Pneumonia    Proliferative diabetic retinopathy ([place]) 09/29/2015   Pronation deformity of both feet 06/14/2014   Reactive depression 12/10/2017   .   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[place]) 02/17/2018   Thromboembolism ([place]) 04/05/2018   Upper airway cough syndrome, with recs to stay off ACE and take Pepcid q hs  11/15/2016   Urticaria    Past Surgical History:  Past Surgical History:  Procedure Laterality Date   A/V FISTULAGRAM Right 03/03/2020   Procedure: Venogram;  Surgeon: Waynetta Sandy, MD;  Location: [place] [facility];  Service: Cardiovascular;  Laterality: Right;   ARTERY REPAIR Right 02/17/2018   Procedure: EXPLORATION OF RIGHT BRACHIAL ARTERY;  Surgeon: Conrad Dolan [place], MD;  Location: [place];  Service: Vascular;  Laterality: Right;   ARTERY REPAIR Right 02/17/2018   Procedure: BRACHIAL ARTERY EXPLORATION AND TRHOMBECTOMY;  Surgeon: Conrad [place], MD;  Location: [place];  Service: Vascular;  Laterality: Right;   AV FISTULA PLACEMENT Left 05/09/2017   Procedure: INSERTION OF ARTERIOVENOUS (AV) GRAFT ARM (ARTEGRAFT);  Surgeon: Conrad Dickens, MD;  Location: [facility] OR;  Service: Vascular;  Laterality: Left;   AV FISTULA PLACEMENT Right 02/17/2018   Procedure: INSERTION OF ARTERIOVENOUS (AV) GORE-TEX GRAFT ARM RIGHT UPPER ARM;  Surgeon: Conrad Chariton, MD;  Location: [place];  Service: Vascular;  Laterality: Right;   AV FISTULA PLACEMENT Left 10/10/2019   Procedure: INSERTION OF LEFT ARTERIOVENOUS (AV) ARTEGRAFT GRAFT ARM;  Surgeon: Waynetta Sandy, MD;  Location: [place];  Service: Vascular;  Laterality: Left;   AV FISTULA PLACEMENT Right 03/19/2020   Procedure: INSERTION OF ARTERIOVENOUS (AV) GORE-TEX GRAFT IN RIGHT ARM AND VIABAHN STENT IN RIGHT AXILLARY ARTERY;  Surgeon: Waynetta Sandy, MD;  Location: [place];  Service: Vascular;  Laterality: Right;   [place] Left 08/31/2016   Procedure: BASILIC VEIN TRANSPOSITION FIRST STAGE;  Surgeon: Conrad Pantego, MD;  Location: [place];  Service: Vascular;  Laterality: Left;   BRONCHIAL WASHINGS  09/17/2021   Procedure: BRONCHIAL WASHINGS;  Surgeon: Collene Gobble, MD;  Location: [facility];  Service: Cardiopulmonary;;   CENTRAL VENOUS CATHETER INSERTION Left 07/24/2018   Procedure: INSERTION CENTRAL LINE ADULT;  Surgeon: Conrad Coweta, MD;  Location: [place];  Service: Vascular;  Laterality: Left;   EYE SURGERY     secondary to diabetic retinopathy    FOOT SURGERY Right    t"ook bone out- maybe hammer toe"   INSERTION OF DIALYSIS CATHETER Left 07/24/2018   Procedure: INSERTION OF TUNNELED DIALYSIS CATHETER;  Surgeon: Conrad Cheraw, MD;  Location: [place];  Service: Vascular;  Laterality: Left;   IR FLUORO GUIDE CV LINE LEFT  12/19/2021   IR FLUORO GUIDE CV LINE LEFT  12/29/2021   IR FLUORO GUIDE CV LINE RIGHT  07/21/2018   IR FLUORO GUIDE CV LINE RIGHT  06/01/2019   IR FLUORO GUIDE CV LINE RIGHT  05/05/2020   IR FLUORO GUIDE CV LINE RIGHT  10/04/2022   IR REMOVAL TUN CV CATH W/O FL  01/23/2021   IR REMOVAL TUN CV CATH W/O FL  12/19/2021   IR REMOVAL TUN CV CATH W/O FL  10/01/2022   IR US GUIDE VASC ACCESS LEFT  12/19/2021   IR US GUIDE VASC ACCESS RIGHT  07/21/2018   IR US GUIDE VASC ACCESS RIGHT  06/01/2019   IR US GUIDE VASC ACCESS RIGHT  10/04/2022   LIGATION ARTERIOVENOUS GORTEX GRAFT Right 03/20/2020   Procedure: LIGATION ARM ARTERIOVENOUS GORTEX GRAFT With Patch Angioplasty, Thrombectomy;  Surgeon: Waynetta Sandy, MD;  Location: [place];  Service: Vascular;  Laterality: Right;   LIGATION OF ARTERIOVENOUS  FISTULA Left 05/09/2017   Procedure: LIGATION OF ARTERIOVENOUS  FISTULA;  Surgeon: Conrad Turkey, MD;  Location: [place];  Service: Vascular;  Laterality: Left;   LOOP RECORDER INSERTION N/A 12/05/2017   Procedure: LOOP RECORDER INSERTION;  Surgeon: Evans Lance, MD;  Location: [place] [facility];  Service: Cardiovascular;  Laterality: N/A;   MYOMECTOMY     REMOVAL OF GRAFT Right 02/17/2018   Procedure: REMOVAL OF RIGHT UPPER ARM ARTERIOVENOUS GRAFT;  Surgeon: Conrad Advance, MD;  Location: [place];  Service: Vascular;  Laterality: Right;   REVISION OF ARTERIOVENOUS GORETEX GRAFT Left 07/24/2018   Procedure: REDO ARTERIOVENOUS GORETEX GRAFT;  Surgeon: Conrad Humb[place]t, MD;  Location: [place];  Service: Vascular;   Laterality: Left;   TEE WITHOUT CARDIOVERSION N/A 12/05/2017   Procedure: TRANSESOPHAGEAL ECHOCARDIOGRAM (TEE);  Surgeon: Sanda Klein, MD;  Location: [place];  Service: Cardiovascular;  Laterality: N/A;   TEE WITHOUT CARDIOVERSION N/A 09/30/2022   Procedure: TRANSESOPHAGEAL ECHOCARDIOGRAM (TEE);  Surgeon: Jerline Pain, MD;  Location: [facility];  Service: Cardiovascular;  Laterality: N/A;   UPPER EXTREMITY VENOGRAPHY Left 07/13/2018   Procedure: UPPER EXTREMITY VENOGRAPHY - Central & Left Arm;  Surgeon: Conrad Englewood, MD;  Location: [place] [facility];  Service: Cardiovascular;  Laterality: Left;   UPPER EXTREMITY VENOGRAPHY Bilateral 09/10/2019   Procedure: UPPER EXTREMITY VENOGRAPHY;  Surgeon: Waynetta Sandy, MD;  Location: [place] CV LAB;  Service: Cardiovascular;  Laterality: Bilateral;   UTERINE FIBROID SURGERY     VIDEO BRONCHOSCOPY N/A 09/17/2021   Procedure: VIDEO BRONCHOSCOPY WITHOUT FLUORO;  Surgeon: Collene Gobble, MD;  Location: [facility];  Service: Cardiopulmonary;  Laterality: N/A;   VITRECTOMY Bilateral  HPI:  51 year old presenting 12/17 with AMS and trouble speaking. [name] admitted with new CVAs. CT scan of the head with left paramedian frontal parietal region infarct. MRI showed new 9 mm posterior left cerebral infarct. Hospitalization 10/21/2022-10/24/2022 with acute infarct left paramedian frontoparietal region (scored 24/30 on [facility]MS). PMH of cardiac arrest, stroke, GERD, upper airway cough syndrome, PNA, ESRD on HD MWF, insulin-dependent type 2 diabetes with gastroparesis, PE and DVT on Eliquis, HTN, right BKA    Assessment / Plan / Recommendation  Clinical Impression  Pt does not demonstrate oral acceptance of boluses. She will alert to tactile stimulation as SLP brings a bolus or spoon to pt's mouth, but she does not open her mouth at all during evaluation. She keeps her teeth tightly clenched and turns her head away from the oral stimulus,  suggesting refusal. She does not follow commands to complete oral motor exam. Recommend that she remain NPO for now pending further assessment of oropharyngeal function. SLP Visit Diagnosis: Dysphagia, unspecified (R13.10)    Aspiration Risk  Moderate aspiration risk;Risk for inadequate nutrition/hydration    Diet Recommendation NPO   Medication Administration: Via alternative means    Other  Recommendations Oral Care Recommendations: Oral care QID Other Recommendations: Have oral suction available    Recommendations for follow up therapy are one component of a multi-disciplinary discharge planning process, led by the attending physician.  Recommendations may be updated based on patient status, additional functional criteria and insurance authorization.  Follow up Recommendations Skilled nursing-short term rehab (<3 hours/day)      Assistance Recommended at Discharge    Functional Status Assessment Patient has had a recent decline in their functional status and demonstrates the ability to make significant improvements in function in a reasonable and predictable amount of time.  Frequency and Duration min 2x/week  2 weeks       Prognosis Prognosis for Safe Diet Advancement: Good Barriers to Reach Goals: Cognitive deficits;Language deficits      Swallow Study   General HPI: 51 year old presenting 12/17 with AMS and trouble speaking. [name] admitted with new CVAs. CT scan of the head with left paramedian frontal parietal region infarct. MRI showed new 9 mm posterior left cerebral infarct. Hospitalization 10/21/2022-10/24/2022 with acute infarct left paramedian frontoparietal region (scored 24/30 on [facility]). PMH of cardiac arrest, stroke, GERD, upper airway cough syndrome, PNA, ESRD on HD MWF, insulin-dependent type 2 diabetes with gastroparesis, PE and DVT on Eliquis, HTN, right BKA Type of Study: Bedside Swallow Evaluation Previous Swallow Assessment: none in chart Diet Prior to this  Study: NPO Temperature Spikes Noted: No Respiratory Status: Room air History of Recent Intubation: No Behavior/Cognition: Lethargic/Drowsy;Doesn't follow directions Oral Cavity Assessment: Other (comment) (UTA - leaves teeth clenched) Oral Care Completed by SLP: No Oral Cavity - Dentition: Adequate natural dentition (as able to be seen) Self-Feeding Abilities: Refused PO Patient Positioning: Upright in bed Baseline Vocal Quality: Normal Volitional Cough: Cognitively unable to elicit Volitional Swallow: Unable to elicit    Oral/Motor/Sensory Function Overall Oral Motor/Sensory Function: Other (comment) (UTA)   Ice Chips Ice chips: Impaired Presentation: Spoon Oral Phase Functional Implications: Other (comment) (no acceptance)   Thin Liquid Thin Liquid: Not tested    Nectar Thick Nectar Thick Liquid: Not tested   Honey Thick Honey Thick Liquid: Not tested   Puree Puree: Not tested   Solid     Solid: Not tested      Osie Bond., M.A. [place] Office [phone]  Secure chat  preferred  12/13/2022,5:26 PM

## 2022-12-13 NOTE — Consult Note (Addendum)
[facility] Renal Consultation Note    Indication for Consultation:  Management of ESRD/hemodialysis, anemia, hypertension/volume, and secondary hyperparathyroidism.  HPI: Heather [name] is a 51 y.o. female with PMH including ESRD on dialysis, prior CVA, T2DM, gastroparesis, hx PE, HTN, s/p BKA, who presented to the ED with  AMS. 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 Discharged on vanc and cefepime. Per notes, Husband reports around 3AM he found her in her wheelchair saying she was ready for church. He brought her back to bed but she had some trouble transferring. This AM, she continued to be weak and confused and vomited, so husband brought her to the ED. She reportedly also took '10mg'$  of baclofen yesterday for leg spasms. In the ED, BP was significantly elevated, CT head showed new embolic strokes. Labs notable for K+ 4.3, BUN 40, Cr 10.90, Ca 9.5, Alb 3.4, Hgb 10.4 then 13.3, WBC 6.6. CXR showed mild vascular congestion. Neurology recommended repeat echo, normotensive BP goal, and management of metabolic derangements. Nephrology consulted for management of ESRD. On exam, patient is somnolent and unable to provide any history.     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[place]), nonhemorrhagic, inferior right cerebellum 12/03/2017   left side weakness in leg   Cyst of left ovary 01/12/2018   Diabetic neuropathy, painful ([place]), on low dose Gabapentin 07/13/2013   DM (diabetes mellitus), type 2, uncontrolled, with renal complications 28/78/6767   DM2 (diabetes mellitus, type 2) ([place])    Dysfunction of left eustachian tube, with pusatile tinnitus 11/24/2017   Dyslipidemia associated with type 2 diabetes mellitus ([place])  06/25/2016   ESRD (end stage renal disease) on dialysis [facility])    On hemodialysis in July 2019 via [facility] then switched to [facility] in Nov 2019.    ESRD with anemia (HCC)    Fibromyalgia    Gastroparesis due to DM    GERD (gastroesophageal reflux disease)    Headache    Hypertension associated with diabetes ([place]) 02/19/2011   IBS (irritable bowel syndrome)    Left-sided weakness 12/10/2017   .   Leiomyoma of uterus    Pancreatitis    PE (pulmonary thromboembolism) ([place]) 11/04/2019   Pneumonia    Proliferative diabetic retinopathy ([place]) 09/29/2015   Pronation deformity of both feet 06/14/2014   Reactive depression 12/10/2017   .   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[place]) 02/17/2018   Thromboembolism ([place]) 04/05/2018   Upper airway cough syndrome, with recs to stay off ACE and take Pepcid q hs 11/15/2016   Urticaria    Past Surgical History:  Procedure Laterality Date   A/V FISTULAGRAM Right 03/03/2020   Procedure: Venogram;  Surgeon: Waynetta Sandy, MD;  Location: [place] [facility];  Service: Cardiovascular;  Laterality: Right;   ARTERY REPAIR Right 02/17/2018   Procedure: EXPLORATION OF RIGHT BRACHIAL ARTERY;  Surgeon: Conrad [place], MD;  Location: [place];  Service: Vascular;  Laterality: Right;   ARTERY REPAIR Right 02/17/2018   Procedure: BRACHIAL ARTERY EXPLORATION AND TRHOMBECTOMY;  Surgeon: Conrad , MD;  Location: [place];  Service: Vascular;  Laterality: Right;   AV FISTULA PLACEMENT Left 05/09/2017  Procedure: INSERTION OF ARTERIOVENOUS (AV) GRAFT ARM (ARTEGRAFT);  Surgeon: Conrad [place], MD;  Location: [place];  Service: Vascular;  Laterality: Left;   AV FISTULA PLACEMENT Right 02/17/2018   Procedure: INSERTION OF ARTERIOVENOUS (AV) GORE-TEX GRAFT ARM RIGHT UPPER ARM;  Surgeon: Conrad Adwolf, MD;  Location: [place];  Service: Vascular;  Laterality: Right;   AV FISTULA  PLACEMENT Left 10/10/2019   Procedure: INSERTION OF LEFT ARTERIOVENOUS (AV) ARTEGRAFT GRAFT ARM;  Surgeon: Waynetta Sandy, MD;  Location: [place];  Service: Vascular;  Laterality: Left;   AV FISTULA PLACEMENT Right 03/19/2020   Procedure: INSERTION OF ARTERIOVENOUS (AV) GORE-TEX GRAFT IN RIGHT ARM AND VIABAHN STENT IN RIGHT AXILLARY ARTERY;  Surgeon: Waynetta Sandy, MD;  Location: [place];  Service: Vascular;  Laterality: Right;   [place] Left 08/31/2016   Procedure: BASILIC VEIN TRANSPOSITION FIRST STAGE;  Surgeon: Conrad Eden, MD;  Location: [place];  Service: Vascular;  Laterality: Left;   BRONCHIAL WASHINGS  09/17/2021   Procedure: BRONCHIAL WASHINGS;  Surgeon: Collene Gobble, MD;  Location: [facility];  Service: Cardiopulmonary;;   CENTRAL VENOUS CATHETER INSERTION Left 07/24/2018   Procedure: INSERTION CENTRAL LINE ADULT;  Surgeon: Conrad [place]field, MD;  Location: [place];  Service: Vascular;  Laterality: Left;   EYE SURGERY     secondary to diabetic retinopathy    FOOT SURGERY Right    t"ook bone out- maybe hammer toe"   INSERTION OF DIALYSIS CATHETER Left 07/24/2018   Procedure: INSERTION OF TUNNELED DIALYSIS CATHETER;  Surgeon: Conrad [place], MD;  Location: [place];  Service: Vascular;  Laterality: Left;   IR FLUORO GUIDE CV LINE LEFT  12/19/2021   IR FLUORO GUIDE CV LINE LEFT  12/29/2021   IR FLUORO GUIDE CV LINE RIGHT  07/21/2018   IR FLUORO GUIDE CV LINE RIGHT  06/01/2019   IR FLUORO GUIDE CV LINE RIGHT  05/05/2020   IR FLUORO GUIDE CV LINE RIGHT  10/04/2022   IR REMOVAL TUN CV CATH W/O FL  01/23/2021   IR REMOVAL TUN CV CATH W/O FL  12/19/2021   IR REMOVAL TUN CV CATH W/O FL  10/01/2022   IR US GUIDE VASC ACCESS LEFT  12/19/2021   IR US GUIDE VASC ACCESS RIGHT  07/21/2018   IR US GUIDE VASC ACCESS RIGHT  06/01/2019   IR US GUIDE VASC ACCESS RIGHT  10/04/2022   LIGATION ARTERIOVENOUS GORTEX GRAFT Right 03/20/2020   Procedure: LIGATION ARM ARTERIOVENOUS GORTEX  GRAFT With Patch Angioplasty, Thrombectomy;  Surgeon: Waynetta Sandy, MD;  Location: [place];  Service: Vascular;  Laterality: Right;   LIGATION OF ARTERIOVENOUS  FISTULA Left 05/09/2017   Procedure: LIGATION OF ARTERIOVENOUS  FISTULA;  Surgeon: Conrad [place], MD;  Location: [place];  Service: Vascular;  Laterality: Left;   LOOP RECORDER INSERTION N/A 12/05/2017   Procedure: LOOP RECORDER INSERTION;  Surgeon: Evans Lance, MD;  Location: [place] [facility];  Service: Cardiovascular;  Laterality: N/A;   MYOMECTOMY     REMOVAL OF GRAFT Right 02/17/2018   Procedure: REMOVAL OF RIGHT UPPER ARM ARTERIOVENOUS GRAFT;  Surgeon: Conrad Dover, MD;  Location: [place];  Service: Vascular;  Laterality: Right;   REVISION OF ARTERIOVENOUS GORETEX GRAFT Left 07/24/2018   Procedure: REDO ARTERIOVENOUS GORETEX GRAFT;  Surgeon: Conrad , MD;  Location: [place];  Service: Vascular;  Laterality: Left;   TEE WITHOUT CARDIOVERSION N/A 12/05/2017   Procedure: TRANSESOPHAGEAL ECHOCARDIOGRAM (TEE);  Surgeon: Sanda Klein, MD;  Location: [place];  Service: Cardiovascular;  Laterality: N/A;   TEE WITHOUT CARDIOVERSION N/A 09/30/2022   Procedure: TRANSESOPHAGEAL ECHOCARDIOGRAM (TEE);  Surgeon: Jerline Pain, MD;  [facility];  Service: Cardiovascular;  Laterality: N/A;   UPPER EXTREMITY VENOGRAPHY Left 07/13/2018   Procedure: UPPER EXTREMITY VENOGRAPHY - Central & Left Arm;  Surgeon: Conrad Jack, MD;  Location: [place] [facility];  Service: Cardiovascular;  Laterality: Left;   UPPER EXTREMITY VENOGRAPHY Bilateral 09/10/2019   Procedure: UPPER EXTREMITY VENOGRAPHY;  Surgeon: Waynetta Sandy, MD;  Location: [place] [facility];  Service: Cardiovascular;  Laterality: Bilateral;   UTERINE FIBROID SURGERY     VIDEO BRONCHOSCOPY N/A 09/17/2021   Procedure: VIDEO BRONCHOSCOPY WITHOUT FLUORO;  Surgeon: Collene Gobble, MD;  [facility]: [facility];  Service: Cardiopulmonary;  Laterality: N/A;    VITRECTOMY Bilateral    Family History  Problem Relation Age of Onset   Colon polyps Mother    Diabetes Mother    Heart murmur Father        history essentially unknown   Hypertension Father    Diabetes Sister    Kidney failure Sister        kidney transplant   Diabetes Brother    Retinal degeneration Brother    Heart murmur Son    Stroke Paternal Grandmother    Diabetes Sister    Social History:  reports that she has never smoked. She has never been exposed to tobacco smoke. She has never used smokeless tobacco. She reports that she does not drink alcohol and does not use drugs.  ROS: As per HPI otherwise negative.  Physical Exam: Vitals:   12/12/22 2300 12/13/22 0130 12/13/22 0545 12/13/22 0700  BP: (!) 174/101 (!) 159/107 (!) 188/112 (!) 190/102  Pulse: 80 85 88 85  Resp: (!) '9 13 10 '$ (!) 8  Temp:    98 F (36.7 C)  TempSrc:      SpO2: 100% 100% 99% 96%     General: Well developed, somnolent female, awake and sitting on the edge of the bed with the assistance of PT, however remains nonverbal and unable to follow commands. Head: mucus membranes are moist. Neck: JVD not elevated. Lungs: Clear bilaterally to auscultation anteriorly without wheezes, rales, or rhonchi. Breathing is unlabored. Heart: RRR with normal S1, S2. No murmurs, rubs, or gallops appreciated. Abdomen: Soft, non-distended with normoactive bowel sounds.  Musculoskeletal:  Strength and tone appear normal for age. Lower extremities: No edema b/l lower extremities. R BKA Neuro: Somnolent.  Psych:  Responds to questions appropriately with a normal affect. Dialysis Access: [facility] R chest with intact bandage  Allergies  Allergen Reactions   Ibuprofen Other (See Comments)    CKD stage 3. Should avoid.   Dilaudid [Hydromorphone Hcl] Itching and Other (See Comments)    Can take with Benadryl.   Iodinated Contrast Media Rash   Tramadol Itching   Prior to Admission medications   Medication Sig Start Date End  Date Taking? Authorizing Provider  amLODipine-olmesartan (AZOR) 5-20 MG tablet Take 1 tablet by mouth daily. 10/26/22   Thurnell Lose, MD  aspirin 81 MG chewable tablet Chew 1 tablet (81 mg total) by mouth daily. 10/24/22   Thurnell Lose, MD  azelastine (ASTELIN) 0.1 % nasal spray Place 2 sprays into both nostrils 2 (two) times daily. 10/12/22   [name], MD  calcium acetate (PHOSLO) 667 MG capsule Take 2,001 mg by mouth 3 (three)  times daily with meals. 08/25/21   [provider]  ceFEPIme 2 g in sodium chloride 0.9 % 100 mL Inject 2 g into the vein every Monday, Wednesday, and Friday at 8 PM. 10/06/22   Ghimire, Henreitta Leber, MD  clindamycin (CLEOCIN T) 1 % SWAB Apply 1 Application topically 3 (three) times a week. 09/22/22   [provider]  Continuous Blood Gluc Sensor (FREESTYLE LIBRE 14 DAY SENSOR) MISC 1 patch by Does not apply route every 14 (fourteen) days. 02/25/22   [name], MD  cromolyn (OPTICROM) 4 % ophthalmic solution Place 1 drop into both eyes 4 (four) times daily as needed. 12/08/22   Clemon Chambers, MD  DIFLUCAN 100 MG tablet Take by mouth. 10/20/22   [provider]  Doxercalciferol (HECTOROL IV) Doxercalciferol (Hectorol) 05/07/22 05/06/23  [provider]  fluticasone (FLONASE) 50 MCG/ACT nasal spray Place 2 sprays into both nostrils daily. 12/01/22   Clemon Chambers, MD  gabapentin (NEURONTIN) 300 MG capsule Take 1 capsule (300 mg total) by mouth every Monday, Wednesday, and Friday. At 10:30 am 10/13/22   [name], MD  insulin aspart (NOVOLOG FLEXPEN) 100 UNIT/ML FlexPen Inject 6 Units into the skin 3 (three) times daily with meals. Sliding scale 02/25/22   [name], MD  insulin degludec (TRESIBA FLEXTOUCH) 100 UNIT/ML FlexTouch Pen Inject 20 Units into the skin at bedtime. Patient taking differently: Inject 26 Units into the skin at bedtime. 10/05/22   Ghimire, Henreitta Leber, MD  Insulin Pen Needle 29G X 5MM MISC 1 Device  by Does not apply route 4 (four) times daily. 12/26/19   Shamleffer, Melanie Crazier, MD  ketoconazole (NIZORAL) 2 % cream Apply 1 Application topically 2 (two) times a week. 09/22/22   [provider]  loratadine (CLARITIN) 10 MG tablet Take 10 mg by mouth daily. 09/30/22   [provider]  Methoxy PEG-Epoetin Beta (MIRCERA IJ) Mircera 03/03/22 03/02/23  [provider]  Olopatadine HCl (PATADAY) 0.2 % SOLN Apply 1 drop to eye daily. Patient taking differently: Apply 1 drop to eye daily as needed (dry eyes). 10/12/22   [name], MD  pantoprazole (PROTONIX) 40 MG tablet Take 1 tablet (40 mg total) by mouth daily. 12/01/22   Clemon Chambers, MD  rosuvastatin (CRESTOR) 10 MG tablet Take 1 tablet (10 mg total) by mouth daily. 10/25/22   Thurnell Lose, MD  triamcinolone ointment (KENALOG) 0.1 % Apply topically twice daily to BODY as needed for red, sandpaper like rash.  Do not use on face, groin or armpits. 12/01/22   Clemon Chambers, MD  vancomycin [facility]) 1-5 GM/200ML-% SOLN Inject 200 mLs (1,000 mg total) into the vein every Monday, Wednesday, and Friday with hemodialysis. 10/06/22   Ghimire, Henreitta Leber, MD   Current Facility-Administered Medications  Medication Dose Route Frequency Provider Last Rate Last Admin    stroke: early stages of recovery book   Does not apply Once Tu, Ching T, DO       acetaminophen (TYLENOL) tablet 650 mg  650 mg Oral Q4H PRN Tu, Ching T, DO       Or   acetaminophen (TYLENOL) 160 MG/5ML solution 650 mg  650 mg Per Tube Q4H PRN Tu, Ching T, DO       Or   acetaminophen (TYLENOL) suppository 650 mg  650 mg Rectal Q4H PRN Tu, Ching T, DO       aspirin EC tablet 81 mg  81 mg Oral  Daily Amie Portland, MD       Chlorhexidine Gluconate Cloth 2 % PADS 6 each  6 each Topical Q0600 Donato Heinz, MD       clopidogrel (PLAVIX) tablet 75 mg  75 mg Oral Daily Camelia Phenes, MD       heparin injection 5,000 Units  5,000 Units Subcutaneous Q8H Tu,  Ching T, DO   5,000 Units at 12/13/22 0604   insulin aspart (novoLOG) injection 0-15 Units  0-15 Units Subcutaneous TID PC & HS Tu, Ching T, DO       labetalol (NORMODYNE) injection 10 mg  10 mg Intravenous Q2H PRN Tu, Ching T, DO       Current Outpatient Medications  Medication Sig Dispense Refill   amLODipine-olmesartan (AZOR) 5-20 MG tablet Take 1 tablet by mouth daily. 90 tablet 0   aspirin 81 MG chewable tablet Chew 1 tablet (81 mg total) by mouth daily. 30 tablet 0   azelastine (ASTELIN) 0.1 % nasal spray Place 2 sprays into both nostrils 2 (two) times daily. 30 mL 12   calcium acetate (PHOSLO) 667 MG capsule Take 2,001 mg by mouth 3 (three) times daily with meals.     ceFEPIme 2 g in sodium chloride 0.9 % 100 mL Inject 2 g into the vein every Monday, Wednesday, and Friday at 8 PM.     clindamycin (CLEOCIN T) 1 % SWAB Apply 1 Application topically 3 (three) times a week.     Continuous Blood Gluc Sensor (FREESTYLE LIBRE 14 DAY SENSOR) MISC 1 patch by Does not apply route every 14 (fourteen) days. 6 each 5   cromolyn (OPTICROM) 4 % ophthalmic solution Place 1 drop into both eyes 4 (four) times daily as needed. 10 mL 3   DIFLUCAN 100 MG tablet Take by mouth.     Doxercalciferol (HECTOROL IV) Doxercalciferol (Hectorol)     fluticasone (FLONASE) 50 MCG/ACT nasal spray Place 2 sprays into both nostrils daily. 18.2 mL 2   gabapentin (NEURONTIN) 300 MG capsule Take 1 capsule (300 mg total) by mouth every Monday, Wednesday, and Friday. At 10:30 am 30 capsule 3   insulin aspart (NOVOLOG FLEXPEN) 100 UNIT/ML FlexPen Inject 6 Units into the skin 3 (three) times daily with meals. Sliding scale 15 mL    insulin degludec (TRESIBA FLEXTOUCH) 100 UNIT/ML FlexTouch Pen Inject 20 Units into the skin at bedtime. (Patient taking differently: Inject 26 Units into the skin at bedtime.) 24 mL 3   Insulin Pen Needle 29G X 5MM MISC 1 Device by Does not apply route 4 (four) times daily. 400 each 3   ketoconazole  (NIZORAL) 2 % cream Apply 1 Application topically 2 (two) times a week.     loratadine (CLARITIN) 10 MG tablet Take 10 mg by mouth daily.     Methoxy PEG-Epoetin Beta (MIRCERA IJ) Mircera     Olopatadine HCl (PATADAY) 0.2 % SOLN Apply 1 drop to eye daily. (Patient taking differently: Apply 1 drop to eye daily as needed (dry eyes).) 2.5 mL 3   pantoprazole (PROTONIX) 40 MG tablet Take 1 tablet (40 mg total) by mouth daily. 30 tablet 3   rosuvastatin (CRESTOR) 10 MG tablet Take 1 tablet (10 mg total) by mouth daily. 30 tablet 0   triamcinolone ointment (KENALOG) 0.1 % Apply topically twice daily to BODY as needed for red, sandpaper like rash.  Do not use on face, groin or armpits. 80 g 1   vancomycin (VANCOCIN) 1-5 GM/200ML-% SOLN Inject 200 mLs (1,000  mg total) into the vein every Monday, Wednesday, and Friday with hemodialysis. 4000 mL    Labs: Basic Metabolic Panel: Recent Labs  Lab 12/12/22 1356 12/12/22 1419  NA 137 137  K 3.9 4.3  CL 98 101  CO2 27  --   GLUCOSE 187* 191*  BUN 39* 40*  CREATININE 10.31* 10.90*  CALCIUM 9.5  --    Liver Function Tests: Recent Labs  Lab 12/12/22 1356  AST 24  ALT 61*  ALKPHOS 108  BILITOT 0.4  PROT 7.9  ALBUMIN 3.4*   No results for input(s): "LIPASE", "AMYLASE" in the last 168 hours. Recent Labs  Lab 12/12/22 1356  AMMONIA 24   CBC: Recent Labs  Lab 12/12/22 1356 12/12/22 1419  WBC 6.6  --   NEUTROABS 4.1  --   HGB 10.4* 13.3  HCT 34.2* 39.0  MCV 97.7  --   PLT 133*  --    Cardiac Enzymes: No results for input(s): "CKTOTAL", "CKMB", "CKMBINDEX", "TROPONINI" in the last 168 hours. CBG: Recent Labs  Lab 12/12/22 1318  GLUCAP 197*   Iron Studies: No results for input(s): "IRON", "TIBC", "TRANSFERRIN", "FERRITIN" in the last 72 hours. Studies/Results: MR BRAIN WO CONTRAST  Result Date: 12/12/2022 CLINICAL DATA:  Altered mental status.  Aphasia. EXAM: MRI HEAD WITHOUT CONTRAST TECHNIQUE: Multiplanar, multiecho pulse  sequences of the brain and surrounding structures were obtained without intravenous contrast. COMPARISON:  CT head without contrast 12/12/2022. MR head without contrast 10/21/2022. FINDINGS: Brain: 2 foci of restricted diffusion are present within the subcortical white matter of the right occipital pole. The larger measures 9 mm. T2 and FLAIR signal changes are associated with the infarcts. No other acute infarcts are present. Moderate generalized atrophy and advanced diffuse confluent periventricular and scattered subcortical T2 hyperintensities bilaterally are stable. A remote right cerebellar infarct is stable. A posterior left cerebellar infarct measures 9 mm. This is new since the prior MRI but not acute. Expected evolution of nonhemorrhagic infarct in the medial left frontal parietal cortex is noted. The ventricles are proportionate to the degree of atrophy. No significant extraaxial fluid collection is present. White matter changes extending into the brainstem are stable. Vascular: Flow is present in the major intracranial arteries. Skull and upper cervical spine: The craniocervical junction is normal. Upper cervical spine is within normal limits. Marrow signal is unremarkable. Sinuses/Orbits: The paranasal sinuses and mastoid air cells are clear. Bilateral lens replacements are noted. Exophthalmos is stable. Globes and orbits are otherwise unremarkable. IMPRESSION: 1. 2 foci of restricted diffusion within the subcortical white matter of the right occipital pole consistent with acute/subacute nonhemorrhagic infarcts. 2. 9 mm posterior left cerebellar infarct is new since the prior MRI, but not acute 3. Expected evolution of nonhemorrhagic infarct in the medial left frontal parietal cortex. 4. Moderate generalized atrophy and advanced white matter disease likely reflects the sequela of chronic microvascular ischemia. These results were called by telephone at the time of interpretation on 12/12/2022 at 7:55 pm  to provider [facility] , who verbally acknowledged these results. Electronically Signed   By: San Morelle M.D.   On: 12/12/2022 19:55   DG Chest Portable 1 View  Result Date: 12/12/2022 CLINICAL DATA:  Altered mental status. EXAM: PORTABLE CHEST 1 VIEW COMPARISON:  Chest radiograph dated October 21, 2022 FINDINGS: The heart is enlarged. Mild pulmonary vascular congestion. Right IJ access dialysis catheter with distal tip in the right atrium, unchanged. Right axillary vascular stent is also unchanged. Low lung volumes without  evidence of focal consolidation or pleural effusion. Thoracic spondylosis. IMPRESSION: 1. Cardiomegaly with mild pulmonary vascular congestion. 2. Low lung volumes without evidence of focal consolidation or pleural effusion. Electronically Signed   By: Keane Police D.O.   On: 12/12/2022 15:14   CT Head Wo Contrast  Result Date: 12/12/2022 CLINICAL DATA:  Altered mental status. Nontraumatic. Patient went to bed last night around midnight normally, woke up this a.m. at 3 and was not able to talk EXAM: CT HEAD WITHOUT CONTRAST TECHNIQUE: Contiguous axial images were obtained from the base of the skull through the vertex without intravenous contrast. RADIATION DOSE REDUCTION: This exam was performed according to the departmental dose-optimization program which includes automated exposure control, adjustment of the mA and/or kV according to patient size and/or use of iterative reconstruction technique. COMPARISON:  CT examination dated October 26, 223 FINDINGS: Brain: No evidence of acute infarction, hemorrhage, hydrocephalus, extra-axial collection or mass lesion/mass effect. Low attenuation in the left paramedian frontoparietal region suggesting recent infarct. 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 Diffuse low-attenuation of the periventricular white matter presumed chronic microvascular ischemic changes. Vascular: No hyperdense  vessel or unexpected calcification. Skull: Normal. Negative for fracture or focal lesion. Sinuses/Orbits: No acute finding. Other: None. IMPRESSION: 1. No acute intracranial hemorrhage, mass effect or midline shift. MRI examination for further evaluation is suggested if clinically warranted. 2. Low attenuation in the left paramedian frontoparietal region suggesting recent infarct, similar to prior examination. 3. 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 4. Advanced chronic microvascular ischemic changes of the white matter, unchanged. Electronically Signed   By: Keane Police D.O.   On: 12/12/2022 14:18    Dialysis Orders:  Center: [facility]  on MWF . 180NRe 3 hr 45 min BFR 400 DFR 700 EDW 102.3kg 2K 2 Ca TDC Heparin 7000 unit bolus Venoefr '50mg'$  weekly Gectorol 52mg IV q HD   Assessment/Plan:  New embolic stroke: neurology following. Recent endocarditis. HD center did have to use ceftaz at one point due to cefepime shortage but appears she otherwise completed her IV abx. Repeat echo pending. Management per primary team/neurology Acute encephalopathy: new embolic stroke on MRI but also took baclofen which is contraindicated in ESRD. Stop baclofen and HD today as below.   ESRD:  On MWF schedule, will plan for HD today.   Hypertension/volume: Bp significantly elevated on arrival. Neurology recommended normotensive BP goal. UF with HD as tolerated today.   Anemia: Hgb above goal, no ESA indicated at this time.   Metabolic bone disease: Continue binders once tolerating PO (calcium acetate 3 caps per meal). Continue VDRA  Nutrition:  Currently NPO T2DM: management per primary team  SAnice Paganini PA-C 12/13/2022, 9:02 AM  [facility] Pager: [phone] I have seen and examined this patient and agree with plan and assessment in the above note with renal recommendations/intervention highlighted.  Will continue with HD on regular schedule as  hospital census allows.  JGovernor RooksColadonato,MD 12/13/2022 12:48 PM

## 2022-12-14 ENCOUNTER — Ambulatory Visit: Payer: Medicare Other | Admitting: Internal Medicine

## 2022-12-14 DIAGNOSIS — G9341 Metabolic encephalopathy: Secondary | ICD-10-CM | POA: Diagnosis not present

## 2022-12-14 DIAGNOSIS — I5189 Other ill-defined heart diseases: Secondary | ICD-10-CM

## 2022-12-14 DIAGNOSIS — Z7189 Other specified counseling: Secondary | ICD-10-CM | POA: Diagnosis not present

## 2022-12-14 DIAGNOSIS — Z8673 Personal history of transient ischemic attack (TIA), and cerebral infarction without residual deficits: Secondary | ICD-10-CM

## 2022-12-14 DIAGNOSIS — I63431 Cerebral infarction due to embolism of right posterior cerebral artery: Secondary | ICD-10-CM | POA: Diagnosis not present

## 2022-12-14 DIAGNOSIS — G9340 Encephalopathy, unspecified: Principal | ICD-10-CM

## 2022-12-14 DIAGNOSIS — I330 Acute and subacute infective endocarditis: Secondary | ICD-10-CM | POA: Diagnosis not present

## 2022-12-14 DIAGNOSIS — N186 End stage renal disease: Secondary | ICD-10-CM | POA: Diagnosis not present

## 2022-12-14 DIAGNOSIS — R4182 Altered mental status, unspecified: Secondary | ICD-10-CM | POA: Diagnosis not present

## 2022-12-14 LAB — CBC
HCT: 33 % — ABNORMAL LOW (ref 36.0–46.0)
Hemoglobin: 10.4 g/dL — ABNORMAL LOW (ref 12.0–15.0)
MCH: 30.3 pg (ref 26.0–34.0)
MCHC: 31.5 g/dL (ref 30.0–36.0)
MCV: 96.2 fL (ref 80.0–100.0)
Platelets: 130 10*3/uL — ABNORMAL LOW (ref 150–400)
RBC: 3.43 MIL/uL — ABNORMAL LOW (ref 3.87–5.11)
RDW: 15.1 % (ref 11.5–15.5)
WBC: 6.5 10*3/uL (ref 4.0–10.5)
nRBC: 0 % (ref 0.0–0.2)

## 2022-12-14 LAB — RENAL FUNCTION PANEL
Albumin: 3.1 g/dL — ABNORMAL LOW (ref 3.5–5.0)
Albumin: 3.1 g/dL — ABNORMAL LOW (ref 3.5–5.0)
Anion gap: 18 — ABNORMAL HIGH (ref 5–15)
Anion gap: 13 (ref 5–15)
BUN: 52 mg/dL — ABNORMAL HIGH (ref 6–20)
BUN: 24 mg/dL — ABNORMAL HIGH (ref 6–20)
CO2: 23 mmol/L (ref 22–32)
CO2: 25 mmol/L (ref 22–32)
Calcium: 8.9 mg/dL (ref 8.9–10.3)
Calcium: 8.8 mg/dL — ABNORMAL LOW (ref 8.9–10.3)
Chloride: 99 mmol/L (ref 98–111)
Chloride: 96 mmol/L — ABNORMAL LOW (ref 98–111)
Creatinine, Ser: 12.83 mg/dL — ABNORMAL HIGH (ref 0.44–1.00)
Creatinine, Ser: 7.24 mg/dL — ABNORMAL HIGH (ref 0.44–1.00)
GFR, Estimated: 3 mL/min — ABNORMAL LOW (ref >60)
GFR, Estimated: 6 mL/min — ABNORMAL LOW (ref >60)
Glucose, Bld: 145 mg/dL — ABNORMAL HIGH (ref 70–99)
Glucose, Bld: 98 mg/dL (ref 70–99)
Phosphorus: 7.1 mg/dL — ABNORMAL HIGH (ref 2.5–4.6)
Phosphorus: 4.8 mg/dL — ABNORMAL HIGH (ref 2.5–4.6)
Potassium: 4.2 mmol/L (ref 3.5–5.1)
Potassium: 4.2 mmol/L (ref 3.5–5.1)
Sodium: 140 mmol/L (ref 135–145)
Sodium: 134 mmol/L — ABNORMAL LOW (ref 135–145)

## 2022-12-14 LAB — GLUCOSE, CAPILLARY
Glucose-Capillary: 145 mg/dL — ABNORMAL HIGH (ref 70–99)
Glucose-Capillary: 116 mg/dL — ABNORMAL HIGH (ref 70–99)
Glucose-Capillary: 124 mg/dL — ABNORMAL HIGH (ref 70–99)
Glucose-Capillary: 91 mg/dL (ref 70–99)

## 2022-12-14 MED ORDER — SODIUM CHLORIDE 0.9 % IV SOLN
1.0000 g | INTRAVENOUS | Status: DC
  Administered 2022-12-14 – 2022-12-21 (×9): 1 g via INTRAVENOUS
  Filled 2022-12-14 (×10): qty 10

## 2022-12-14 MED ORDER — SODIUM CHLORIDE 0.9 % IV SOLN: INTRAVENOUS | Status: DC | PRN

## 2022-12-14 MED ORDER — VANCOMYCIN HCL IN DEXTROSE 1-5 GM/200ML-% IV SOLN
1000.0000 mg | Freq: Once | INTRAVENOUS | Status: AC
  Administered 2022-12-14: 1000 mg via INTRAVENOUS
  Filled 2022-12-14: qty 200

## 2022-12-14 MED ORDER — HEPARIN SODIUM (PORCINE) 1000 UNIT/ML IJ SOLN
1200.0000 [IU] | Freq: Once | INTRAMUSCULAR | Status: AC
  Administered 2022-12-14: 1200 [IU] via INTRAVENOUS
  Filled 2022-12-14: qty 2

## 2022-12-14 MED ORDER — ALTEPLASE 2 MG IJ SOLR: 2.0000 mg | Freq: Once | INTRAMUSCULAR | Status: DC | PRN

## 2022-12-14 MED ORDER — ANTICOAGULANT SODIUM CITRATE 4% (200MG/5ML) IV SOLN: 5.0000 mL | Status: DC | PRN

## 2022-12-14 MED ORDER — HEPARIN SODIUM (PORCINE) 1000 UNIT/ML DIALYSIS
1000.0000 [IU] | INTRAMUSCULAR | Status: DC | PRN
  Filled 2022-12-14: qty 1

## 2022-12-14 NOTE — Progress Notes (Addendum)
STROKE TEAM PROGRESS NOTE   INTERVAL HISTORY Patient was evaluated at bedside with husband present in room. She awake and alert and interactive.  And smiles but does not multiwords. Patient appeared  following basic commands. 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 Vitals:   12/14/22 1130 12/14/22 1150 12/14/22 1243 12/14/22 1516  BP: (!) 94/55 (!) 147/75 (!) 177/90 (!) 184/94  Pulse: 93 89 90   Resp: (!) 34 '16 16 16  '$ Temp:  97.8 F (36.6 C) 97.6 F (36.4 C) 98 F (36.7 C)  TempSrc:  Axillary Oral Oral  SpO2: 100% 99% 99%    CBC:  Recent Labs  Lab 12/12/22 1356 12/12/22 1419 12/14/22 0354  WBC 6.6  --  6.5  NEUTROABS 4.1  --   --   HGB 10.4* 13.3 10.4*  HCT 34.2* 39.0 33.0*  MCV 97.7  --  96.2  PLT 133*  --  010*   Basic Metabolic Panel:  Recent Labs  Lab 12/12/22 1356 12/12/22 1419 12/14/22 0354  NA 137 137 140  K 3.9 4.3 4.2  CL 98 101 99  CO2 27  --  23  GLUCOSE 187* 191* 145*  BUN 39* 40* 52*  CREATININE 10.31* 10.90* 12.83*  CALCIUM 9.5  --  8.9  PHOS  --   --  7.1*   Urine Drug Screen: No results for input(s): "LABOPIA", "COCAINSCRNUR", "LABBENZ", "AMPHETMU", "THCU", "LABBARB" in the last 168 hours.  Alcohol Level No results for input(s): "ETH" in the last 168 hours.  IMAGING past 24 hours Overnight EEG with video  Result Date: 12/14/2022 Lora Havens, MD     12/14/2022  1:52 PM Patient Name: Heather [name] MRN: [id number] Epilepsy Attending: Lora Havens Referring Physician/Provider: Barb Merino, MD Duration: 12/13/2022 1531 to 12/14/2022 2025, 12/14/2022 1241 to 0150  Patient history: 51 year old female with altered mental status.  EEG to evaluate for seizure.  Level of alertness: Awake  AEDs during EEG study: None  Technical aspects: This EEG study was done with scalp electrodes positioned according to the 10-20 International system of electrode placement. Electrical activity was reviewed  with band pass filter of 1-'70Hz'$ , sensitivity of 7 uV/mm, display speed of 78m/sec with a '60Hz'$  notched filter applied as appropriate. EEG data were recorded continuously and digitally stored.  Video monitoring was available and reviewed as appropriate.  Description: No clear posterior dominant rhythm was seen. EEG showed continuous generalized 3-5 Hz theta-delta slowing. Generalized periodic discharges with triphasic morphology were also noted at 1 to 1.5 Hz, more prominent when awake/stimulated. Hyperventilation and photic stimulation were not performed.   EEG was disconnected between 12/14/2022 0729 to 1241 for dialysis.  ABNORMALITY - Periodic discharges with triphasic morphology, generalized ( GPDs) - Continuous slow, generalized  IMPRESSION: This study showed generalized periodic discharges with triphasic morphology likely due to toxic -metabolic causes. Additionally there is moderate diffuse encephalopathy, nonspecific etiology. No seizures were seen throughout the recording.  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 Speech was absent. She was able to follow only simple commands intermittently.  Cranial Nerves: II:  Visual fields unable to be tested III,IV, VI:  no ptosis, extra-ocular motions unable to be tested V,VII: patient does not smile when prompted, facial light touch sensation unable to be assessed XII: midline tongue extension without atrophy and without fasciculations  Motor: Generalized weakness noted throughout, though likely related to confusion, inattention  Tone and bulk: normal tone throughout; no atrophy  noted  Sensory: unable to be assessed  Cerebellar: Unable to assess  Gait: not observed during encounter  ASSESSMENT/PLAN Heather [name] is a 51 y.o. 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  Found to have endocarditis at that time and from a stroke prevention standpoint recommended aspirin only.  She was on Eliquis due to DVT/PE history which was discontinued after the infective endocarditis. She comes back again to the emergency room on 12/12/2022 after being found down and altered at home. MRI of the brain shows new ischemic infarcts when compared to October.  #Acute metabolic encephalopathy  #Acute/subacute R occipital non-hemorrhagic strokes #Prior posterior L cerebellar infarct #Prior L frontal parietal cortex non-hemorrhagic stroke Code Stroke CT head No acute intracranial hemorrhage, mass effect or midline shift. Low attenuation in the left paramedian frontoparietal region suggesting recent infarct, similar to prior examination. 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 Advanced chronic microvascular ischemic changes of the white matter, unchanged. MRI shows 2 foci of restricted diffusion within the subcortical white matter of the right occipital pole consistent with acute/subacute nonhemorrhagic infarcts. 9 mm posterior left cerebellar infarct is new since the prior MRI, but not acute. Expected evolution of nonhemorrhagic infarct in the medial left frontal parietal cortex. Moderate generalized atrophy and advanced white matter disease likely reflects the sequela of  chronic microvascular ischemia. 2D Echo EF 60-65% with mild LVH, notable highly mobile mass on posterior MV measuring 1.9 cm x 0.5 cm  LDL pending HgbA1c 7.4 VTE prophylaxis - heparin subq    Diet   Diet NPO time specified   No antithrombotic prior to admission, now on aspirin 300 mg suppository daily. After cleared by SLP, switch  81 mg and clopidogrel 75 mg daily for 21 days, then clopidogrel 75 mg alone daily indefinitely EEG negative for seizures, ordered overnight EEG given continued encephalopathy Therapy recommendations:  pending medical improvement Disposition:  pending medical clearance, stroke workup  Hypertension Home meds:  amlodipine Stable Permissive hypertension (OK if < 220/120) but gradually normalize in 5-7 days Long-term BP goal normotensive  Hyperlipidemia Home meds: rosuvastatin 10 mg LDL pending, goal < 70  Diabetes type II Uncontrolled Home meds:  insulin HgbA1c 7.4, goal < 7.0 CBGs Recent Labs    12/13/22 2325 12/14/22 0605 12/14/22 1253  GLUCAP 119* 145* 116*    SSI  Other Stroke Risk Factors Obesity, There is no height or weight on file to calculate BMI., BMI >/= 30 associated with increased stroke risk, recommend weight loss, diet and exercise as appropriate   Other Active Problems PVD ESRD on dialysis  Anemia in chronic kidney disease, on chronic dialysis  Hospital day # 1   Patient presented with somnolence and altered mental status.MRI scan shows small new right occipital and left cerebellar infarcts with 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  Neurological exam shows encephalopathy out of proportion to the new strokes and she is likely throwing multiple emboli from her large vegetation and endocarditis.  Treatment of endocarditis as per primary team.  She may need reconsideration for cardiothoracic surgery for surgical removal of this large vegetation with and not sure that her quality of life at baseline and medical  comorbidities will allow that.Palliative care consult to discuss goals of care will be appropriate. Discussed with husband at the bedside and answered questions.  Recommend aspirin and Plavix for 3 weeks and then aspirin alone.  Aggressive risk factor modification.  Greater than 50% time during this 35-minute visit were spent in counseling and coordination of care and discussion with patient and her husband and answering questions.  Discussed with Dr.Girguis.  Stroke team will sign off.  Kindly call for questions Antony Contras, MD Medical Director [facility] Pager: [phone] 12/14/2022 4:44 PM To contact Stroke Continuity provider, please refer to [email or web address] After hours, contact General Neurology

## 2022-12-14 NOTE — Procedures (Signed)
HD Note:  Some information was entered later than the data was gathered due to patient care needs. The stated time with the data is accurate.  Received patient in bed to unit.  Alert and not interactive.  Informed consent signed and in chart.    Patient had episode of SBP > 100.  See flow sheet.  Another episode of SBP > 100, UF stopped again.  Transported back to the room  Alert, not interactive, without signs or symptoms of acute distress.  Hand-off given to patient's nurse.   Access used: Right chest HD catheter Access issues: None  Total UF removed: [facility]

## 2022-12-14 NOTE — Progress Notes (Signed)
RN called notified EEG team  that patient is going to HD. Walk through how to [facility] and rehook for patient to do to go to HD

## 2022-12-14 NOTE — Assessment & Plan Note (Signed)
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 Kipp Brood on 05/14/2022 and given that she had history of multiple PEA arrests and her underlying comorbidities, she was not felt to be a good surgical candidate.  Now with acute on chronic strokes and worsening functional status, suspect she is still not a surgical candidate

## 2022-12-14 NOTE — Consult Note (Signed)
[facility]    Date of Admission:  12/12/2022     Reason for Consult: Culture negative endocarditis     Referring Physician: Dr Sabino Gasser  Current antibiotics: Vancomycin Zosyn  ASSESSMENT:    51 y.o. 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  While on therapy, she developed CNS embolic infarcts noted on MRI in October 2023 and a repeat MRI 12/12/2022 shows new infarcts from prior with acute change in mental status prompting admission.  # Acute encephalopathy and new cerebral infarcts:  Seen by neurology whom feels that neurologic exam seems out of proportion to the new strokes.    # ESRD on HD: Receives HD via tunneled HD line.    RECOMMENDATIONS:    Continue vancomycin Change from Zosyn to Cefepime Recommend getting repeat TEE to better evaluate her valves and would ask CT surgery to see her given the complications that have arisen since they saw her last Follow cultures Will follow along   Principal Problem:   Stroke [facility]) Active Problems:   Diabetes mellitus, type II, insulin dependent ([place]), on CGM, with end organ damage: renal, neuropathy, gastroparesis, retinopathy   Hypertension associated with diabetes ([place])   Anemia in chronic kidney disease, on chronic dialysis ([place])   ESRD on dialysis ([place])   S/P BKA (below knee amputation), right (HCC)   PVD (peripheral vascular disease) ([place])   Acute metabolic encephalopathy   MEDICATIONS:    Scheduled Meds:  aspirin  300 mg Rectal Daily   Chlorhexidine Gluconate Cloth  6 each Topical Q0600   heparin  5,000 Units Subcutaneous Q8H   insulin aspart  0-15 Units Subcutaneous TID PC & HS   sodium chloride flush  10-40 mL Intracatheter Q12H   Continuous Infusions:  sodium chloride 5 mL/hr at 12/14/22 1425   ceFEPime (MAXIPIME) IV     [START ON 12/15/2022] vancomycin      vancomycin 1,000 mg (12/14/22 1427)   PRN Meds:.sodium chloride, acetaminophen **OR** acetaminophen (TYLENOL) oral liquid 160 mg/5 mL **OR** acetaminophen, labetalol, sodium chloride flush  HPI:    Heather [name] is a 51 y.o. female with past medical history as noted below.  She was admitted 12/12/2022 with encephalopathy and aphasia.  MRI brain revealed acute/subacute infarct in the right occipital lobe as well as a new posterior left cerebral infarct which was new from her prior MRI.  Patient was previously admitted in October 2023 after presenting for an outpatient TEE which revealed a new mitral valve vegetation concerning for infective endocarditis.  She was seen by cardiothoracic surgery at that time and did not feel that surgical intervention was indicated at that time.  The recommendation was for a repeat echocardiogram in 2 to 3 weeks.  Patient's blood cultures remained negative during that admission.  She also underwent HD catheter removal with line holiday prior to reimplantation of a new dialysis line on 10/04/2022.  Per the last ID note from 10/05/2022, the plan was for 6 weeks of vancomycin and cefepime with HD through 11/12/2022 to complete 6 weeks of therapy.  Patient was scheduled for hospital follow-up on 11/11/2022, however, she did not keep that appointment.  Additionally, there was no further echocardiogram follow-up.  She was subsequently readmitted to the hospital on 10/21/2022 through 10/24/2022 at which time she presented with right upper extremity and right lower extremity numbness and discoordination.  She was found to have acute  infarct in the left paramedian frontal parietal region with punctate additional acute infarcts in the left frontal cortex and right occipital cortex.  Given the multiple vascular territories, an embolic etiology was suspected.  Her antibiotics were continued as noted above through 11/12/2022.  [name] now admitted again as of 12/12/22 with  encephalopathy and new strokes noted on MRI.  A TTE was repeated yesterday showing a highly mobile mass on the mitral valve which is reported as similar to her prior echocardiogram.  There is mild stenosis and trivial regurgitation.  She has been started on vancomycin and Zosyn.  Her blood cultures have been repeated and remain no growth at this time.  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[place]), nonhemorrhagic, inferior right cerebellum 12/03/2017   left side weakness in leg   Cyst of left ovary 01/12/2018   Diabetic neuropathy, painful ([place]), on low dose Gabapentin 07/13/2013   DM (diabetes mellitus), type 2, uncontrolled, with renal complications 01/75/1025   DM2 (diabetes mellitus, type 2) ([place])    Dysfunction of left eustachian tube, with pusatile tinnitus 11/24/2017   Dyslipidemia associated with type 2 diabetes mellitus ([place]) 06/25/2016   ESRD (end stage renal disease) on dialysis [facility])    On hemodialysis in July 2019 via [facility] then switched to [facility] in Nov 2019.    ESRD with anemia (HCC)    Fibromyalgia    Gastroparesis due to DM    GERD (gastroesophageal reflux disease)    Headache    Hypertension associated with diabetes ([place]) 02/19/2011   IBS (irritable bowel syndrome)    Left-sided weakness 12/10/2017   .   Leiomyoma of uterus    Pancreatitis    PE (pulmonary thromboembolism) ([place]) 11/04/2019   Pneumonia    Proliferative diabetic retinopathy ([place]) 09/29/2015   Pronation deformity of both feet 06/14/2014   Reactive depression 12/10/2017   .   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[place]) 02/17/2018   Thromboembolism ([place]) 04/05/2018   Upper airway cough syndrome,  with recs to stay off ACE and take Pepcid q hs 11/15/2016   Urticaria     Social History   Tobacco Use   Smoking status: Never    Passive exposure: Never   Smokeless tobacco: Never  Vaping Use   Vaping Use: Never used  Substance Use Topics   Alcohol use: No    Alcohol/week: 0.0 standard drinks of alcohol   Drug use: No    Family History  Problem Relation Age of Onset   Colon polyps Mother    Diabetes Mother    Heart murmur Father        history essentially unknown   Hypertension Father    Diabetes Sister    Kidney failure Sister        kidney transplant   Diabetes Brother    Retinal degeneration Brother    Heart murmur Son    Stroke Paternal Grandmother    Diabetes Sister     Allergies  Allergen Reactions   Ibuprofen Other (See Comments)    CKD stage 3. Should avoid.   Dilaudid [Hydromorphone Hcl] Itching and Other (See Comments)    Can take with Benadryl.   Iodinated Contrast Media Rash  Tramadol Itching    Review of Systems  Unable to perform ROS: Patient nonverbal    OBJECTIVE:   Blood pressure (!) 177/90, pulse 90, temperature 97.6 F (36.4 C), temperature source Oral, resp. rate 16, last menstrual period 09/10/2021, SpO2 99 %. There is no height or weight on file to calculate BMI.  Physical Exam Constitutional:      Comments: [name] alert and awake lying in the HD unit currently undergoing hemodialysis.  She does not appear in distress  HENT:     Head: Normocephalic and atraumatic.     Comments: EEG leads are in place Eyes:     Extraocular Movements: Extraocular movements intact.     Conjunctiva/sclera: Conjunctivae normal.  Cardiovascular:     Rate and Rhythm: Normal rate and regular rhythm.     Comments: Tunneled dialysis catheter is in place with no erythema or tenderness Pulmonary:     Effort: Pulmonary effort is normal. No respiratory distress.  Abdominal:     General: There is no distension.     Palpations: Abdomen is soft.   Musculoskeletal:     Cervical back: Normal range of motion and neck supple.     Comments: Status post BKA  Skin:    General: Skin is warm and dry.     Findings: No rash.  Neurological:     Comments: [name] nonverbal.  She opens her eyes and tracks voice but does not follow any commands.      Lab Results: Lab Results  Component Value Date   WBC 6.5 12/14/2022   HGB 10.4 (L) 12/14/2022   HCT 33.0 (L) 12/14/2022   MCV 96.2 12/14/2022   PLT 130 (L) 12/14/2022    Lab Results  Component Value Date   NA 140 12/14/2022   K 4.2 12/14/2022   CO2 23 12/14/2022   GLUCOSE 145 (H) 12/14/2022   BUN 52 (H) 12/14/2022   CREATININE 12.83 (H) 12/14/2022   CALCIUM 8.9 12/14/2022   GFRNONAA 3 (L) 12/14/2022   GFRAA 4 (L) 05/07/2020    Lab Results  Component Value Date   ALT 61 (H) 12/12/2022   AST 24 12/12/2022   ALKPHOS 108 12/12/2022   BILITOT 0.4 12/12/2022       Component Value Date/Time   CRP 6.8 (H) 09/30/2022 2023       Component Value Date/Time   ESRSEDRATE 52 (H) 09/30/2022 2023    I have reviewed the micro and lab results in Epic.  Imaging: Overnight EEG with video  Result Date: 12/14/2022 Lora Havens, MD     12/14/2022  1:52 PM Patient Name: Heather [name] MRN: [id number] Epilepsy Attending: Lora Havens Referring Physician/Provider: Barb Merino, MD Duration: 12/13/2022 1531 to 12/14/2022 4098, 12/14/2022 1241 to 0150  Patient history: 51 year old female with altered mental status.  EEG to evaluate for seizure.  Level of alertness: Awake  AEDs during EEG study: None  Technical aspects: This EEG study was done with scalp electrodes positioned according to the 10-20 International system of electrode placement. Electrical activity was reviewed with band pass filter of 1-'70Hz'$ , sensitivity of 7 uV/mm, display speed of 70m/sec with a '60Hz'$  notched filter applied as appropriate. EEG data were recorded continuously and digitally stored.  Video monitoring was  available and reviewed as appropriate.  Description: No clear posterior dominant rhythm was seen. EEG showed continuous generalized 3-5 Hz theta-delta slowing. Generalized periodic discharges with triphasic morphology were also noted at 1 to 1.5 Hz, more prominent when  awake/stimulated. Hyperventilation and photic stimulation were not performed.   EEG was disconnected between 12/14/2022 0729 to 1241 for dialysis.  ABNORMALITY - Periodic discharges with triphasic morphology, generalized ( GPDs) - Continuous slow, generalized  IMPRESSION: This study showed generalized periodic discharges with triphasic morphology likely due to toxic -metabolic causes. Additionally there is moderate diffuse encephalopathy, nonspecific etiology. No seizures were seen throughout the recording.  Lora Havens  ECHOCARDIOGRAM COMPLETE  Result Date: 12/13/2022    ECHOCARDIOGRAM REPORT   Patient Name:   Heather [name] Date of Exam: 12/13/2022 Medical Rec #:  [id number]            Height:       69.0 in Accession #:    [id number]           Weight:       227.0 lb Date of Birth:  [birth year]           BSA:          2.180 m Patient Age:    82 years             BP:           203/118 mmHg Patient Gender: F                    HR:           89 bpm. Exam Location:  Inpatient Procedure: 2D Echo, Color Doppler and Cardiac Doppler Indications:    Stroke i63.9  History:        Patient has prior history of Echocardiogram examinations, most                 recent 09/30/2022. Risk Factors:Hypertension, Diabetes,                 Dyslipidemia and ESRD. History of MV Vegetation.  Sonographer:    Raquel Sarna Senior RDCS Referring Phys: [id number] [place] T TU  Sonographer Comments: Very difficult study due to body habitus and near constant patient movement. IMPRESSIONS  1. There is a mass attached to the posterior MV calcified annulus that measures 1.9 cm x 0.5 cm. This is highly mobile. This is similar to the prior echo as this was previously present. The  mitral valve is degenerative. Trivial mitral valve regurgitation. Mild mitral stenosis. The mean mitral valve gradient is 5.0 mmHg with average heart rate of 87 bpm. Moderate to severe mitral annular calcification.  2. Left ventricular ejection fraction, by estimation, is 60 to 65%. The left ventricle has normal function. The left ventricle has no regional wall motion abnormalities. There is mild asymmetric left ventricular hypertrophy of the basal-septal segment. Left ventricular diastolic function could not be evaluated.  3. Right ventricular systolic function is normal. The right ventricular size is normal. Tricuspid regurgitation signal is inadequate for assessing PA pressure.  4. A small pericardial effusion is present. The pericardial effusion is circumferential.  5. The aortic valve is tricuspid. There is mild calcification of the aortic valve. There is mild thickening of the aortic valve. Aortic valve regurgitation is not visualized. Aortic valve sclerosis/calcification is present, without any evidence of aortic stenosis. Comparison(s): No significant change from prior study. FINDINGS  Left Ventricle: Left ventricular ejection fraction, by estimation, is 60 to 65%. The left ventricle has normal function. The left ventricle has no regional wall motion abnormalities. The left ventricular internal cavity size was normal in size. There is  mild asymmetric left ventricular hypertrophy of the basal-septal segment. Left  ventricular diastolic function could not be evaluated due to mitral annular calcification (moderate or greater). Left ventricular diastolic function could not be evaluated. Right Ventricle: The right ventricular size is normal. No increase in right ventricular wall thickness. Right ventricular systolic function is normal. Tricuspid regurgitation signal is inadequate for assessing PA pressure. Left Atrium: Left atrial size was normal in size. Right Atrium: Right atrial size was normal in size.  Pericardium: A small pericardial effusion is present. The pericardial effusion is circumferential. Mitral Valve: There is a mass attached to the posterior MV calcified annulus that measures 1.9 cm x 0.5 cm. This is highly mobile. This is similar to the prior echo as this was previously present. The mitral valve is degenerative in appearance. Moderate to severe mitral annular calcification. Trivial mitral valve regurgitation. Mild mitral valve stenosis. MV peak gradient, 9.1 mmHg. The mean mitral valve gradient is 5.0 mmHg with average heart rate of 87 bpm. Tricuspid Valve: The tricuspid valve is grossly normal. Tricuspid valve regurgitation is not demonstrated. No evidence of tricuspid stenosis. Aortic Valve: The aortic valve is tricuspid. There is mild calcification of the aortic valve. There is mild thickening of the aortic valve. Aortic valve regurgitation is not visualized. Aortic valve sclerosis/calcification is present, without any evidence of aortic stenosis. Pulmonic Valve: The pulmonic valve was grossly normal. Pulmonic valve regurgitation is not visualized. No evidence of pulmonic stenosis. Aorta: The aortic root and ascending aorta are structurally normal, with no evidence of dilitation. Venous: The inferior vena cava was not well visualized. IAS/Shunts: The atrial septum is grossly normal.  LEFT VENTRICLE PLAX 2D LVIDd:         3.80 cm   Diastology LVIDs:         2.50 cm   LV e' medial:    3.70 cm/s LV PW:         1.00 cm   LV E/e' medial:  28.4 LV IVS:        1.40 cm   LV e' lateral:   4.79 cm/s LVOT diam:     1.70 cm   LV E/e' lateral: 21.9 LV SV:         37 LV SV Index:   17 LVOT Area:     2.27 cm  RIGHT VENTRICLE RV S prime:     10.60 cm/s TAPSE (M-mode): 1.2 cm LEFT ATRIUM           Index        RIGHT ATRIUM           Index LA diam:      3.80 cm 1.74 cm/m   RA Area:     14.40 cm LA Vol (A4C): 48.8 ml 22.38 ml/m  RA Volume:   33.20 ml  15.23 ml/m  AORTIC VALVE LVOT Vmax:   90.50 cm/s LVOT Vmean:   60.900 cm/s LVOT VTI:    0.162 m  AORTA Ao Root diam: 2.80 cm Ao Asc diam:  3.20 cm MITRAL VALVE MV Area (PHT): 2.42 cm     SHUNTS MV Area VTI:   1.43 cm     Systemic VTI:  0.16 m MV Peak grad:  9.1 mmHg     Systemic Diam: 1.70 cm MV Mean grad:  5.0 mmHg MV Vmax:       1.51 m/s MV Vmean:      101.0 cm/s MV Decel Time: 313 msec MV E velocity: 105.00 cm/s MV A velocity: 138.00 cm/s MV E/A ratio:  0.76 Eleonore Chiquito MD  Electronically signed by Eleonore Chiquito MD Signature Date/Time: 12/13/2022/12:07:02 PM    Final    EEG adult  Result Date: 12/13/2022 Lora Havens, MD     12/13/2022  9:32 AM Patient Name: Heather [name] MRN: [id number] Epilepsy Attending: Lora Havens Referring Physician/Provider: Amie [place], MD Date: 12/13/2022 Duration: 21.38 mins Patient history: 51 year old female with altered mental status.  EEG to evaluate for seizure. Level of alertness: Awake AEDs during EEG study: None Technical aspects: This EEG study was done with scalp electrodes positioned according to the 10-20 International system of electrode placement. Electrical activity was reviewed with band pass filter of 1-'70Hz'$ , sensitivity of 7 uV/mm, display speed of 82m/sec with a '60Hz'$  notched filter applied as appropriate. EEG data were recorded continuously and digitally stored.  Video monitoring was available and reviewed as appropriate. Description: No clear posterior dominant rhythm was seen.  EEG showed continuous generalized 3-5 Hz theta-delta slowing. Generalized periodic discharges with triphasic morphology were also noted at 1 to 1.5 Hz.  Hyperventilation and photic stimulation were not performed.   ABNORMALITY - Periodic discharges with triphasic morphology, generalized ( GPDs) - Continuous slow, generalized IMPRESSION: This study showed generalized periodic discharges with triphasic morphology which can be on the ictal-interictal continuum.  However the frequency and morphology is more commonly indicative of  toxic -metabolic causes. Additionally there is moderate diffuse encephalopathy, nonspecific etiology. No seizures were seen throughout the recording. PLora Havens  MR BRAIN WO CONTRAST  Result Date: 12/12/2022 CLINICAL DATA:  Altered mental status.  Aphasia. EXAM: MRI HEAD WITHOUT CONTRAST TECHNIQUE: Multiplanar, multiecho pulse sequences of the brain and surrounding structures were obtained without intravenous contrast. COMPARISON:  CT head without contrast 12/12/2022. MR head without contrast 10/21/2022. FINDINGS: Brain: 2 foci of restricted diffusion are present within the subcortical white matter of the right occipital pole. The larger measures 9 mm. T2 and FLAIR signal changes are associated with the infarcts. No other acute infarcts are present. Moderate generalized atrophy and advanced diffuse confluent periventricular and scattered subcortical T2 hyperintensities bilaterally are stable. A remote right cerebellar infarct is stable. A posterior left cerebellar infarct measures 9 mm. This is new since the prior MRI but not acute. Expected evolution of nonhemorrhagic infarct in the medial left frontal parietal cortex is noted. The ventricles are proportionate to the degree of atrophy. No significant extraaxial fluid collection is present. White matter changes extending into the brainstem are stable. Vascular: Flow is present in the major intracranial arteries. Skull and upper cervical spine: The craniocervical junction is normal. Upper cervical spine is within normal limits. Marrow signal is unremarkable. Sinuses/Orbits: The paranasal sinuses and mastoid air cells are clear. Bilateral lens replacements are noted. Exophthalmos is stable. Globes and orbits are otherwise unremarkable. IMPRESSION: 1. 2 foci of restricted diffusion within the subcortical white matter of the right occipital pole consistent with acute/subacute nonhemorrhagic infarcts. 2. 9 mm posterior left cerebellar infarct is new since the  prior MRI, but not acute 3. Expected evolution of nonhemorrhagic infarct in the medial left frontal parietal cortex. 4. Moderate generalized atrophy and advanced white matter disease likely reflects the sequela of chronic microvascular ischemia. These results were called by telephone at the time of interpretation on 12/12/2022 at 7:55 pm to provider [facility], who verbally acknowledged these results. Electronically Signed   By: CSan MorelleM.D.   On: 12/12/2022 19:55   DG Chest Portable 1 View  Result Date: 12/12/2022 CLINICAL DATA:  Altered mental status. EXAM:  PORTABLE CHEST 1 VIEW COMPARISON:  Chest radiograph dated October 21, 2022 FINDINGS: The heart is enlarged. Mild pulmonary vascular congestion. Right IJ access dialysis catheter with distal tip in the right atrium, unchanged. Right axillary vascular stent is also unchanged. Low lung volumes without evidence of focal consolidation or pleural effusion. Thoracic spondylosis. IMPRESSION: 1. Cardiomegaly with mild pulmonary vascular congestion. 2. Low lung volumes without evidence of focal consolidation or pleural effusion. Electronically Signed   By: Keane Police D.O.   On: 12/12/2022 15:14     Imaging independently reviewed in Epic.  [facility] [phone] pager 12/14/2022, 2:44 PM

## 2022-12-14 NOTE — Progress Notes (Signed)
LTM maint complete - no skin breakdown .  Patient has braids, leads pulled when hair was moved- multiple leads re-placed Atrium monitored, Event button test confirmed by [facility].

## 2022-12-14 NOTE — Progress Notes (Signed)
Patient arrived to the unit. Husband at bedside. Cardiac tele connected to CCMD. Bed alarms on.

## 2022-12-14 NOTE — Progress Notes (Signed)
Progress Note    Heather [name]   WCH:[id number]  DOB: [birth year]  DOA: 12/12/2022     1 PCP: [name], MD  Initial CC: AMS, weakness  Hospital Course: Heather [name] is a 51 yo female with PMH CVAs, ESRD on HD, DMII, gastroparesis, PE/DVT, HTN, RBKA who presented with altered mentation and aphasia.  Her husband found her lethargic, confused, and weak. She was hospitalized in October 2023 for acute CVA in multiple regions with suspected embolic source (cardiac).  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 MRI brain on admission was positive again for acute CVA.  She was found to have infarcts involving right occipital lobe and left cerebellar. She had significant effect from her stroke this hospitalization with becoming encephalopathic, nonverbal, aphasic, failing swallow evaluation, unable to follow commands. She has been evaluated by neurology during hospitalization as well.  Prognosis appears poor given her deficits and recurrent stroke with inoperable underlying cardiac mass.  Palliative care consulted to help further assist with goals of care discussions.  Interval History:  Patient evaluated bedside with her husband this afternoon.  She remains nonverbal and unable to follow commands.  She failed swallow evaluation. Case discussed with neurology and infectious disease today. I also discussed her poor prognosis with husband bedside.  He plans to talk further with their son about next steps.  Assessment and Plan: * Stroke [facility]) -presents with encephalopathy and aphasia; she is nonverbal, unable to follow commands and has failed SLP eval - MRI Brain: 2 foci of restricted diffusion within the subcortical white matter of the right occipital pole consistent with acute/subacute nonhemorrhagic infarcts. 9 mm posterior left cerebellar infarct is new since the  prior MRI, but not acute. Expected evolution of nonhemorrhagic infarct in the medial left frontal parietal cortex. Moderate generalized atrophy and advanced white matter disease likely reflects the sequela of chronic microvascular ischemia. - severe deficits and recurrent CVA; underlying cardiac mass still seen on repeat echo and likely still not a surgical candidate still in setting of multiple comorbidities and poor functional status worse now than in May 2023 when seen by CTS - palliative care consulted to help guide [place] discussions; patient better suited for consideration for hospice at this time - low benefit to PEG and would only prolong suffering and poor QOL; would also highly consider discontinuation of HD as well in this context  Goals of care, counseling/discussion - Unfortunate progressive functional decline since prior strokes.  Patient now presenting again with recurrent stroke and worsening decline.  [name] now aphasic, encephalopathic, nonverbal, and has failed SLP eval.  [name] not a good candidate for PEG placement and would also be expected to still have further recurrent strokes due to underlying ongoing cardiac mass as embolic source which is untreatable from a surgical standpoint - Neurology also agrees with palliative care consult. -I have briefly discussed this with her husband on 12/14/2022 that her prognosis is poor and consideration should be given for discontinuing her dialysis and for going PEG placement.  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 Kipp Brood on 05/14/2022 and given that she had history of multiple PEA arrests and her underlying comorbidities, she was not felt to be a good surgical candidate.  Now with acute on chronic strokes and worsening functional status, suspect she is still not a surgical candidate  Acute metabolic encephalopathy - recurrent CVA felt to be etiology - patient has not improved since admission - EEG negative; continuous EEG underway but seizure not felt to be etiology at this time - follow up palliative care discussions  ESRD on hemodialysis ([place]) -HD MWF. No missed dialysis. -Nephrology following during hospitalization for ongoing HD while we continue [place] discussions  PVD (peripheral vascular disease) ([place]) S/p right BKA. Wheelchair bound.   Anemia in chronic kidney disease, on chronic dialysis (HCC) -Hgb stable around her baseline  Hypertension associated with diabetes ([place]) - continue permissive HTN - will start to gradually bring down; also will have some control with HD too  Diabetes mellitus, type II, insulin dependent ([place]), on CGM, with end organ damage: renal, neuropathy, gastroparesis, retinopathy - Hemoglobin A1c on 09/2022 of 7.4 - continue SSI   Old records reviewed in assessment of this patient  Antimicrobials: Vancomycin 12/13/2022 >> current Cefepime 12/14/2022 >> current  DVT prophylaxis:  heparin injection 5,000 Units Start: 12/12/22 2230   Code Status:   Code Status: Full Code  Mobility Assessment (last 72 hours)     Mobility Assessment     Row Name 12/14/22 1517 12/13/22 2210 12/13/22 1356       What is the highest level of mobility based on the progressive mobility assessment? Level 1 (Bedfast) - Unable to balance while sitting on edge of bed Level 1 (Bedfast) - Unable to balance while sitting on edge of bed Level 1 (Bedfast) - Unable to balance while sitting on edge of bed     Is the above level different from  baseline mobility prior to current illness? -- No - Consider discontinuing PT/OT --              Barriers to discharge: [place] discussions Disposition Plan: Pending palliative care consult Status is: Inpatient  Objective: Blood pressure (!) 184/94, pulse 90, temperature 98 F (36.7 C), temperature source Oral, resp. rate 16, last menstrual period 09/10/2021, SpO2 99 %.  Examination:  Physical Exam Constitutional:      Comments: Adult woman laying in bed nonverbal and unable to follow commands and is not spontaneously moving all extremities  HENT:     Head: Normocephalic.     Mouth/Throat:     Mouth: Mucous membranes are moist.  Eyes:     Extraocular Movements: Extraocular movements intact.  Cardiovascular:     Rate and Rhythm: Normal rate and regular rhythm.  Pulmonary:     Effort: Pulmonary effort is normal.     Breath sounds: Normal breath sounds.  Abdominal:     General: Bowel sounds are normal. There is no distension.     Palpations: Abdomen is soft.  Musculoskeletal:        General: No swelling.     Cervical back: Normal range of motion.  Skin:    General: Skin is warm and dry.  Neurological:     Comments: 0/5 strength right upper and lower extremities.  Muscle tone appreciated in left upper and lower extremity but patient unable to follow any commands for strength testing.  [name] nonverbal.  Does not blink eyes consistently to commands.      Consultants:  Neurology Infectious disease Palliative care  Procedures:    Data Reviewed: Results for orders placed or performed during the hospital encounter of 12/12/22 (from the past 24 hour(s))  Lipid panel     Status: Abnormal   Collection Time: 12/13/22  8:55 PM  Result Value Ref Range   Cholesterol 212 (H) 0 - 200 mg/dL   Triglycerides 178 (H) <150 mg/dL   HDL 41 >40 mg/dL   Total CHOL/HDL Ratio 5.2 RATIO   VLDL 36 0 - 40 mg/dL   LDL Cholesterol 135 (H) 0 - 99 mg/dL  TSH     Status: None   Collection Time:  12/13/22  8:55 PM  Result Value Ref Range   TSH 1.630 0.350 - 4.500 uIU/mL  Vitamin B12     Status: None   Collection Time: 12/13/22  8:55 PM  Result Value Ref Range   Vitamin B-12 449 180 - 914 pg/mL  Hepatitis B surface antigen     Status: None   Collection Time: 12/13/22  8:55 PM  Result Value Ref Range   Hepatitis B Surface Ag NON REACTIVE NON REACTIVE  Culture, blood (Routine X 2) w Reflex to ID Panel     Status: None (Preliminary result)   Collection Time: 12/13/22  8:55 PM   Specimen: BLOOD RIGHT ARM  Result Value Ref Range   Specimen Description BLOOD RIGHT ARM    Special Requests      BOTTLES DRAWN AEROBIC ONLY Blood Culture results may not be optimal due to an inadequate volume of blood received in culture bottles   Culture      NO GROWTH < 12 HOURS Performed at [facility] [address], [place], [place] [address]    Report Status PENDING   Culture, blood (Routine X 2) w Reflex to ID Panel     Status: None (Preliminary result)   Collection Time: 12/13/22  9:15 PM   Specimen: BLOOD LEFT ARM  Result Value Ref Range   Specimen Description BLOOD LEFT ARM    Special Requests      BOTTLES DRAWN AEROBIC AND ANAEROBIC Blood Culture results may not be optimal due to an inadequate volume of blood received in culture bottles   Culture      NO GROWTH < 12 HOURS Performed at [facility] [address] [place], [place] [address]    Report Status PENDING   Glucose, capillary     Status: Abnormal   Collection Time: 12/13/22 11:25 PM  Result Value Ref Range   Glucose-Capillary 119 (H) 70 - 99 mg/dL  CBC     Status: Abnormal   Collection Time: 12/14/22  3:54 AM  Result Value Ref Range   WBC 6.5 4.0 - 10.5 K/uL   RBC 3.43 (L) 3.87 - 5.11 MIL/uL   Hemoglobin 10.4 (L) 12.0 - 15.0 g/dL   HCT 33.0 (L) 36.0 - 46.0 %   MCV 96.2 80.0 - 100.0 fL   MCH 30.3 26.0 - 34.0 pg   MCHC 31.5 30.0 - 36.0 g/dL   RDW 15.1 11.5 - 15.5 %   Platelets 130 (L) 150 - 400 K/uL    nRBC 0.0 0.0 - 0.2 %  Renal function panel     Status: Abnormal   Collection Time: 12/14/22  3:54 AM  Result Value Ref Range   Sodium 140 135 - 145 mmol/L   Potassium 4.2 3.5 -  5.1 mmol/L   Chloride 99 98 - 111 mmol/L   CO2 23 22 - 32 mmol/L   Glucose, Bld 145 (H) 70 - 99 mg/dL   BUN 52 (H) 6 - 20 mg/dL   Creatinine, Ser 12.83 (H) 0.44 - 1.00 mg/dL   Calcium 8.9 8.9 - 10.3 mg/dL   Phosphorus 7.1 (H) 2.5 - 4.6 mg/dL   Albumin 3.1 (L) 3.5 - 5.0 g/dL   GFR, Estimated 3 (L) >60 mL/min   Anion gap 18 (H) 5 - 15  Glucose, capillary     Status: Abnormal   Collection Time: 12/14/22  6:05 AM  Result Value Ref Range   Glucose-Capillary 145 (H) 70 - 99 mg/dL  Glucose, capillary     Status: Abnormal   Collection Time: 12/14/22 12:53 PM  Result Value Ref Range   Glucose-Capillary 116 (H) 70 - 99 mg/dL   Comment 1 Notify RN    Comment 2 Document in Chart   Glucose, capillary     Status: Abnormal   Collection Time: 12/14/22  4:49 PM  Result Value Ref Range   Glucose-Capillary 124 (H) 70 - 99 mg/dL   Comment 1 Notify RN    Comment 2 Document in Chart    *Note: Due to a large number of results and/or encounters for the requested time period, some results have not been displayed. A complete set of results can be found in Results Review.    I have Reviewed nursing notes, Vitals, and Lab results since pt's last encounter. Pertinent lab results : see above I have ordered labwork to follow up on.  I have reviewed the last note from staff over past 24 hours I have discussed pt's care plan and test results with nursing staff, CM/SW, and other staff as appropriate  Time spent: Greater than 50% of the 55 minute visit was spent in counseling/coordination of care for the patient as laid out in the A&P.   LOS: 1 day   Dwyane Dee, MD [facility] 12/14/2022, 5:38 PM

## 2022-12-14 NOTE — Evaluation (Signed)
Physical Therapy Evaluation Patient Details Name: Heather [name] MRN: [id number] DOB: [birth year] Today's Date: 12/14/2022  History of Present Illness  51 year old admitted with new CVAs.  CT scan of the head with left paramedian frontal parietal region infarct.  MRI showed new 9 mm posterior left cerebral infarct.  Pt with PMH of cardiac arrest, stroke, ESRD on hemodialysis Monday Wednesday Friday, insulin-dependent type 2 diabetes with gastroparesis, PE and DVT on Eliquis, hypertension, right below-knee amputation who was brought from home with altered mental status and aphasia.  Hospitalization 10/21/2022-10/24/2022 with acute infarct left paramedian frontoparietal region.  Clinical Impression   Pt admitted secondary to problem above with deficits below. PTA patient was transferring to/from wheelchair independently, propelling herself, and only needed assist of husband for up/down steps (he bumps her up/down in w/c). Pt currently requires total assist for all mobility and is not following any simple commands. She maintains eyes open throughout session, but does not respond.  Anticipate patient may benefit from PT to address problems listed below (if ability to participate improves). Will continue to follow acutely for trial of PT to maximize functional mobility independence and safety.          Recommendations for follow up therapy are one component of a multi-disciplinary discharge planning process, led by the attending physician.  Recommendations may be updated based on patient status, additional functional criteria and insurance authorization.  Follow Up Recommendations Skilled nursing-short term rehab (<3 hours/day) Can patient physically be transported by private vehicle: No    Assistance Recommended at Discharge Frequent or constant Supervision/Assistance  Patient can return home with the following  Assist for transportation;Help with stairs or ramp for entrance;Two people to help  with walking and/or transfers;Direct supervision/assist for medications management;Direct supervision/assist for financial management    Equipment Recommendations Hospital bed;Other (comment) (hoyer lift)  Recommendations for Other Services       Functional Status Assessment Patient has had a recent decline in their functional status and demonstrates the ability to make significant improvements in function in a reasonable and predictable amount of time.     Precautions / Restrictions Precautions Precautions: Fall Precaution Comments: R BKA Restrictions Weight Bearing Restrictions: No RLE Weight Bearing:  (R BKA)      Mobility  Bed Mobility Overal bed mobility: Needs Assistance Bed Mobility: Rolling Rolling: Total assist         General bed mobility comments: rolling rt and lt for incr stimulation and for pericare with NT; no attempt to assist    Transfers                   General transfer comment: unable    Ambulation/Gait                  Stairs            Wheelchair Mobility    Modified Rankin (Stroke Patients Only) Modified Rankin (Stroke Patients Only) Pre-Morbid Rankin Score: Slight disability Modified Rankin: Severe disability     Balance       Sitting balance - Comments: unable       Standing balance comment: unable                             Pertinent Vitals/Pain Pain Assessment Pain Assessment: Faces Faces Pain Scale: No hurt    Home Living Family/patient expects to be discharged to:: Private residence Living Arrangements: Spouse/significant other Available Help at Discharge: Family;Available 24  hours/day (spouse works, mother says that there is a lot of family that could also provide help) Type of Home: House Home Access: Stairs to enter Entrance Stairs-Rails: Psychiatric nurse of Steps: 3   Home Layout: One level Home Equipment: IT sales professional (4 wheels);Tub  bench;Hand held shower head Additional Comments: Pt sponge bathed most of the time.  She has RLE prosthesis but hasn't been fitting well so she hasn't been using    Prior Function Prior Level of Function : Independent/Modified Independent             Mobility Comments: independent with transfers car, toilet, wheelchair, husband  provide transportation to HD (he lowers and bumps her up steps in w/c) ADLs Comments: spouse provided assist with bathing and some dressing but spouse assist most of the time     Hand Dominance   Dominant Hand: Right    Extremity/Trunk Assessment        Lower Extremity Assessment Lower Extremity Assessment: Difficult to assess due to impaired cognition    Cervical / Trunk Assessment Cervical / Trunk Assessment: Other exceptions Cervical / Trunk Exceptions: overweight  Communication   Communication: Expressive difficulties  Cognition Arousal/Alertness: Awake/alert Behavior During Therapy: Flat affect Overall Cognitive Status: Difficult to assess Area of Impairment: Following commands                       Following Commands:  (Pt not following one step commands consistently)       General Comments: pt followed no commands and no attempts to verbalize. Eyes open throughout        General Comments      Exercises Other Exercises Other Exercises: PROM bil LEs with no resistance or assistance noted   Assessment/Plan    PT Assessment Patient needs continued PT services  PT Problem List Decreased strength;Decreased range of motion;Decreased activity tolerance;Decreased balance;Decreased mobility;Decreased knowledge of use of DME;Decreased cognition;Obesity       PT Treatment Interventions DME instruction;Functional mobility training;Therapeutic activities;Therapeutic exercise;Balance training;Patient/family education;Neuromuscular re-education;Wheelchair mobility training;Cognitive remediation    PT Goals (Current goals can be  found in the Care Plan section)  Acute Rehab PT Goals Patient Stated Goal: unable to state PT Goal Formulation: With family Time For Goal Achievement: 12/28/22 Potential to Achieve Goals: Fair    Frequency Min 2X/week (trial x 2 weeks)     Co-evaluation               AM-PAC PT "6 Clicks" Mobility  Outcome Measure Help needed turning from your back to your side while in a flat bed without using bedrails?: Total Help needed moving from lying on your back to sitting on the side of a flat bed without using bedrails?: Total Help needed moving to and from a bed to a chair (including a wheelchair)?: Total Help needed standing up from a chair using your arms (e.g., wheelchair or bedside chair)?: Total Help needed to walk in hospital room?: Total Help needed climbing 3-5 steps with a railing? : Total 6 Click Score: 6    End of Session   Activity Tolerance: Patient tolerated treatment well Patient left: in bed;with call bell/phone within reach;with nursing/sitter in room;with family/visitor present Nurse Communication: Mobility status PT Visit Diagnosis: Muscle weakness (generalized) (M62.81);Difficulty in walking, not elsewhere classified (R26.2);Other symptoms and signs involving the nervous system (R29.898)    Time: 1610-9604 PT Time Calculation (min) (ACUTE ONLY): 18 min   Charges:   PT Evaluation $PT Eval Low  Complexity: [place]  Office [phone]   Rexanne Mano 12/14/2022, 3:26 PM

## 2022-12-14 NOTE — Progress Notes (Signed)
Speech Language Pathology Treatment: Dysphagia;Cognitive-Linquistic  Patient Details Name: Heather [name] MRN: [id number] DOB: 24-Feb-1971 Today's Date: 12/14/2022 Time: 0370-4888 SLP Time Calculation (min) (ACUTE ONLY): 19 min  Assessment / Plan / Recommendation Clinical Impression  Pt is more alert today but still not responding verbally or following any commands. She tracks a little with her eyes and localizes to name calling. Husband present at bedside today to help confirm some of pt's hx and personal preferences. He played one of her favorite songs for her but without any overt reaction today. Attempted to offer POs again, including different flavors/textures and even using husband as therapeutic agent. Pt continues to seal her lips and turn her lips away from boluses. It sound like she might have very softly phonated x1 while trying to show that she did not want the bolus her husband was offering her. Tried to elicit responses to yes/no questions and open-ended questions to see if she wanted anything different. May have to consider at least temporary, alternative means of nutrition if pt does not start to engage more with POs. Husband says that MD had already discussed this with him, and he acknowledges this but is hopeful she will start to eat and drink. SLP will continue to follow as she becomes more interactive.   HPI HPI: 51 year old presenting 12/17 with AMS and trouble speaking. [name] admitted with new CVAs. CT scan of the head with left paramedian frontal parietal region infarct. MRI showed new 9 mm posterior left cerebral infarct. Hospitalization 10/21/2022-10/24/2022 with acute infarct left paramedian frontoparietal region (scored 24/30 on SLUMS). PMH of cardiac arrest, stroke, GERD, upper airway cough syndrome, PNA, ESRD on HD MWF, insulin-dependent type 2 diabetes with gastroparesis, PE and DVT on Eliquis, HTN, right BKA      SLP Plan  Continue with current plan of care       Recommendations for follow up therapy are one component of a multi-disciplinary discharge planning process, led by the attending physician.  Recommendations may be updated based on patient status, additional functional criteria and insurance authorization.    Recommendations  Diet recommendations: NPO Medication Administration: Via alternative means                Oral Care Recommendations: Oral care QID Follow Up Recommendations: Skilled nursing-short term rehab (<3 hours/day) Assistance recommended at discharge: Frequent or constant Supervision/Assistance SLP Visit Diagnosis: Cognitive communication deficit (R41.841);Aphasia (R47.01) Plan: Continue with current plan of care           Osie Bond., M.A. [place] Office [phone]  Secure chat preferred   12/14/2022, 4:03 PM

## 2022-12-14 NOTE — Assessment & Plan Note (Signed)
-   Unfortunate progressive functional decline since prior strokes.  Patient now presenting again with recurrent stroke and worsening decline.  [name] now aphasic, encephalopathic, nonverbal, and has failed SLP eval.  [name] not a good candidate for PEG placement and would also be expected to still have further recurrent strokes due to underlying ongoing cardiac mass as embolic source which is untreatable from a surgical standpoint - Neurology also agrees with palliative care consult. -I have briefly discussed this with her husband on 12/14/2022 that her prognosis is poor and consideration should be given for discontinuing her dialysis and for going PEG placement.  In context of this, her lifespan would be dramatically shortened to a couple weeks approximately; husband plans to start discussing with their son as well -Palliative care consult has been placed also

## 2022-12-14 NOTE — Progress Notes (Signed)
IV Vanc dose given per [place]

## 2022-12-14 NOTE — Procedures (Signed)
I was present at this dialysis session. I have reviewed the session itself and made appropriate changes.  EEG leads in place.  Vital signs in last 24 hours:  Temp:  [97.4 F (36.3 C)-97.9 F (36.6 C)] 97.4 F (36.3 C) (12/19 0413) Pulse Rate:  [85-93] 90 (12/19 0900) Resp:  [9-23] 18 (12/19 0900) BP: (82-233)/(17-126) 142/83 (12/19 0900) SpO2:  [94 %-100 %] 97 % (12/19 0900) Weight change:  There were no vitals filed for this visit.  Recent Labs  Lab 12/14/22 0354  NA 140  K 4.2  CL 99  CO2 23  GLUCOSE 145*  BUN 52*  CREATININE 12.83*  CALCIUM 8.9  PHOS 7.1*    Recent Labs  Lab 12/12/22 1356 12/12/22 1419 12/14/22 0354  WBC 6.6  --  6.5  NEUTROABS 4.1  --   --   HGB 10.4* 13.3 10.4*  HCT 34.2* 39.0 33.0*  MCV 97.7  --  96.2  PLT 133*  --  130*    Scheduled Meds:  aspirin  300 mg Rectal Daily   Chlorhexidine Gluconate Cloth  6 each Topical Q0600   heparin  5,000 Units Subcutaneous Q8H   insulin aspart  0-15 Units Subcutaneous TID PC & HS   sodium chloride flush  10-40 mL Intracatheter Q12H   Continuous Infusions:  anticoagulant sodium citrate     piperacillin-tazobactam (ZOSYN)  IV 2.25 g (12/14/22 0241)   [START ON 12/15/2022] vancomycin     PRN Meds:.acetaminophen **OR** acetaminophen (TYLENOL) oral liquid 160 mg/5 mL **OR** acetaminophen, alteplase, anticoagulant sodium citrate, heparin, labetalol, sodium chloride flush   Donetta Potts,  MD 12/14/2022, 9:44 AM

## 2022-12-14 NOTE — Hospital Course (Signed)
[name] is a 51 yo female with PMH CVAs, ESRD on HD, DMII, gastroparesis, PE/DVT, HTN, RBKA who presented with altered mentation and aphasia.  Her husband found her lethargic, confused, and weak. She was hospitalized in October 2023 for acute CVA in multiple regions with suspected embolic source (cardiac).  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 MRI brain on admission was positive again for acute CVA.  She was found to have infarcts involving right occipital lobe and left cerebellar. She had significant effect from her stroke this hospitalization with becoming encephalopathic, nonverbal, aphasic, failing swallow evaluation, unable to follow commands. With further clinical monitoring, her mentation did clear and she was able to awaken.  Plan of care was discussed with patient and her husband; they wished for remaining aggressive with pursuit of further workup regarding stroke etiology.

## 2022-12-14 NOTE — Progress Notes (Signed)
SLP Cancellation Note  Patient Details Name: Heather [name] MRN: [id number] DOB: Apr 19, 1971   Cancelled treatment:       Reason Eval/Treat Not Completed: Patient at procedure or test/unavailable (HD). Will f/u as able.    Osie Bond., M.A. [place] Office [phone]  Secure chat preferred   12/14/2022, 8:44 AM

## 2022-12-14 NOTE — Progress Notes (Signed)
Pharmacy Antibiotic Note  Heather [name] is a 51 y.o. 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  Pharmacy has been consulted for Vancomycin + Cefepime dosing due to concern for persistent culture negative IE.   ESRD-MWF however received HD off-schedule today (3.5 hr BFR 400). Vancomycin 2250 mg LD IV x 1 given on 12/18, no maintenance dose given with HD today so will schedule for RN to give (informed RN Jeanette Caprice)  Noted plans to attempt to get back on schedule Wed, 12/20 so will schedule regular maintenance doses for now.   Plan: - Vanc 1g IV x 1 dose now - Continue with Vancomycin 1g/HD-MWF (assuming back on schedule 12/20) - Cefepime 1g IV every 24 hours for now - Will continue to follow HD schedule/duration, culture results, LOT, and antibiotic de-escalation plans     Temp (24hrs), Avg:97.7 F (36.5 C), Min:97.4 F (36.3 C), Max:97.8 F (36.6 C)  Recent Labs  Lab 12/12/22 1356 12/12/22 1419 12/14/22 0354  WBC 6.6  --  6.5  CREATININE 10.31* 10.90* 12.83*    Estimated Creatinine Clearance: 6.5 mL/min (A) (by C-G formula based on SCr of 12.83 mg/dL (H)).    Allergies  Allergen Reactions   Ibuprofen Other (See Comments)    CKD stage 3. Should avoid.   Dilaudid [Hydromorphone Hcl] Itching and Other (See Comments)    Can take with Benadryl.   Iodinated Contrast Media Rash   Tramadol Itching    Antimicrobials this admission: Vanc 12/18 >> Zosyn 12/19 x 1 Cefepime 12/19 >>  Dose adjustments this admission: N/a  Microbiology results: 12/17 COVID/flu >> neg 12/18 BCx >> ng<12h 12/19 BCx >>  Thank you for allowing pharmacy to be a part of this patient's care.  Alycia Rossetti, PharmD, BCPS Infectious Diseases Clinical Pharmacist 12/14/2022 1:34 PM   **Pharmacist phone directory can now be found on [email or web address] ([email or web address]).  Listed under [place].

## 2022-12-14 NOTE — Procedures (Addendum)
Patient Name: Heather [name]  MRN: [id number]  Epilepsy Attending: Lora Havens  Referring Physician/Provider: Barb Merino, MD  Duration: 12/13/2022 1531 to 12/14/2022 4784, 12/14/2022 1241 to 2030   Patient history: 51 year old female with altered mental status.  EEG to evaluate for seizure.   Level of alertness: Awake   AEDs during EEG study: None   Technical aspects: This EEG study was done with scalp electrodes positioned according to the 10-20 International system of electrode placement. Electrical activity was reviewed with band pass filter of 1-'70Hz'$ , sensitivity of 7 uV/mm, display speed of 81m/sec with a '60Hz'$  notched filter applied as appropriate. EEG data were recorded continuously and digitally stored.  Video monitoring was available and reviewed as appropriate.   Description: No clear posterior dominant rhythm was seen. EEG showed continuous generalized 3-5 Hz theta-delta slowing. Generalized periodic discharges with triphasic morphology were also noted at 1 to 2 Hz, more prominent when awake/stimulated. Hyperventilation and photic stimulation were not performed.     EEG was disconnected between 12/14/2022 0729 to 1241 for dialysis.   ABNORMALITY - Periodic discharges with triphasic morphology, generalized ( GPDs) - Continuous slow, generalized   IMPRESSION: This study showed generalized periodic discharges with triphasic morphology likely due to toxic -metabolic causes. Additionally there is moderate diffuse encephalopathy, nonspecific etiology. No seizures were seen throughout the recording.   [name] OBarbra Sarks

## 2022-12-15 DIAGNOSIS — R4182 Altered mental status, unspecified: Secondary | ICD-10-CM | POA: Diagnosis not present

## 2022-12-15 DIAGNOSIS — G9341 Metabolic encephalopathy: Secondary | ICD-10-CM | POA: Diagnosis not present

## 2022-12-15 DIAGNOSIS — I63431 Cerebral infarction due to embolism of right posterior cerebral artery: Secondary | ICD-10-CM | POA: Diagnosis not present

## 2022-12-15 DIAGNOSIS — I5189 Other ill-defined heart diseases: Secondary | ICD-10-CM | POA: Diagnosis not present

## 2022-12-15 DIAGNOSIS — Z7189 Other specified counseling: Secondary | ICD-10-CM | POA: Diagnosis not present

## 2022-12-15 LAB — CBC WITH DIFFERENTIAL/PLATELET
Abs Immature Granulocytes: 0.03 10*3/uL (ref 0.00–0.07)
Basophils Absolute: 0 10*3/uL (ref 0.0–0.1)
Basophils Relative: 0 %
Eosinophils Absolute: 0.3 10*3/uL (ref 0.0–0.5)
Eosinophils Relative: 5 %
HCT: 34.4 % — ABNORMAL LOW (ref 36.0–46.0)
Hemoglobin: 11.3 g/dL — ABNORMAL LOW (ref 12.0–15.0)
Immature Granulocytes: 1 %
Lymphocytes Relative: 25 %
Lymphs Abs: 1.6 10*3/uL (ref 0.7–4.0)
MCH: 30.2 pg (ref 26.0–34.0)
MCHC: 32.8 g/dL (ref 30.0–36.0)
MCV: 92 fL (ref 80.0–100.0)
Monocytes Absolute: 0.6 10*3/uL (ref 0.1–1.0)
Monocytes Relative: 9 %
Neutro Abs: 3.9 10*3/uL (ref 1.7–7.7)
Neutrophils Relative %: 60 %
Platelets: 131 10*3/uL — ABNORMAL LOW (ref 150–400)
RBC: 3.74 MIL/uL — ABNORMAL LOW (ref 3.87–5.11)
RDW: 15.1 % (ref 11.5–15.5)
WBC: 6.5 10*3/uL (ref 4.0–10.5)
nRBC: 0 % (ref 0.0–0.2)

## 2022-12-15 LAB — GLUCOSE, CAPILLARY
Glucose-Capillary: 96 mg/dL (ref 70–99)
Glucose-Capillary: 88 mg/dL (ref 70–99)
Glucose-Capillary: 153 mg/dL — ABNORMAL HIGH (ref 70–99)
Glucose-Capillary: 172 mg/dL — ABNORMAL HIGH (ref 70–99)

## 2022-12-15 LAB — HEPATITIS B SURFACE ANTIBODY, QUANTITATIVE: Hep B S AB Quant (Post): 604.3 m[IU]/mL (ref >9.9)

## 2022-12-15 LAB — MAGNESIUM: Magnesium: 2 mg/dL (ref 1.7–2.4)

## 2022-12-15 LAB — HEMOGLOBIN A1C
Hgb A1c MFr Bld: 7.9 % — ABNORMAL HIGH (ref 4.8–5.6)
Mean Plasma Glucose: 180 mg/dL

## 2022-12-15 MED ORDER — CHLORHEXIDINE GLUCONATE CLOTH 2 % EX PADS
6.0000 | MEDICATED_PAD | Freq: Every day | CUTANEOUS | Status: DC
  Administered 2022-12-16 – 2022-12-24 (×9): 6 via TOPICAL

## 2022-12-15 MED ORDER — VANCOMYCIN HCL IN DEXTROSE 1-5 GM/200ML-% IV SOLN
1000.0000 mg | Freq: Once | INTRAVENOUS | Status: DC
  Filled 2022-12-15: qty 200

## 2022-12-15 MED ORDER — CLOPIDOGREL BISULFATE 75 MG PO TABS
75.0000 mg | ORAL_TABLET | Freq: Every day | ORAL | Status: DC
  Administered 2022-12-15 – 2022-12-24 (×9): 75 mg via ORAL
  Filled 2022-12-15 (×10): qty 1

## 2022-12-15 MED ORDER — ASPIRIN 81 MG PO CHEW: 324.0000 mg | CHEWABLE_TABLET | Freq: Every day | ORAL | Status: DC

## 2022-12-15 MED ORDER — ASPIRIN 81 MG PO CHEW
81.0000 mg | CHEWABLE_TABLET | Freq: Every day | ORAL | Status: DC
  Administered 2022-12-15 – 2022-12-24 (×9): 81 mg via ORAL
  Filled 2022-12-15 (×10): qty 1

## 2022-12-15 NOTE — NC FL2 (Signed)
[place] MEDICAID FL2 LEVEL OF CARE FORM     IDENTIFICATION  Patient Name: Heather [name] Birthdate: 08/20/1971 Sex: female Admission Date (Current Location): 12/12/2022  [facility] and [place] Number:  Herbalist and Address:  [facility], [place] [address] [place], [place] [address]      Provider Number: [id number]  Attending Physician Name and Address:  Dwyane Dee, MD  Relative Name and Phone Number:       Current Level of Care: Hospital Recommended Level of Care: [place] Prior Approval Number:    Date Approved/Denied:   PASRR Number: [id number]  Discharge Plan: SNF    Current Diagnoses: Patient Active Problem List   Diagnosis Date Noted   Encephalopathy 12/14/2022   Cardiac valve mass 12/14/2022   Goals of care, counseling/discussion 12/14/2022   PVD (peripheral vascular disease) ([place]) 27/05/2375   Acute metabolic encephalopathy 28/31/5176   Stroke ([place]) 10/21/2022   Allergic conjunctivitis 10/12/2022   Mitral valve mass    Right atrial mass    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   Shortness of breath 07/02/2022   Thrombus of venous dialysis catheter ([place]) 02/25/2022   Displacement of vascular dialysis catheter, initial encounter ([place]) 12/18/2021   S/P BKA (below knee amputation), right ([place]) 03/31/2021   Impaired mobility and ADLs 03/25/2021   CVA (cerebral vascular accident) ([place]) 03/04/2021   Pulmonary embolus ([place]) 03/04/2021   Charcot ankle, right 02/23/2021   Hx of cardiac arrest 11/12/2020   Abnormal CT of the chest 09/26/2020   Allergy, unspecified, initial encounter 09/22/2020   Other mechanical complication of other cardiac and vascular devices and implants, initial encounter ([place]) 05/10/2020   Positive ANA (antinuclear antibody) 04/16/2020   ESRD on hemodialysis ([place])    Hemiplegia and hemiparesis following cerebral infarction affecting left non-dominant side ([place]) 12/29/2018   IBS (irritable  bowel syndrome) 08/06/2017   Dyslipidemia associated with type 2 diabetes mellitus ([place]) 06/25/2016   Proliferative diabetic retinopathy ([place]) 09/29/2015   Chronic cough 10/04/2014   B12 deficiency 05/10/2014   Anemia in chronic kidney disease, on chronic dialysis ([place]) 04/26/2014   CKD (chronic kidney disease) stage 5, GFR less than 15 ml/min (HCC) 04/26/2014   Secondary hyperparathyroidism of renal origin ([place]) 11/27/2013   Posterior subcapsular cataract, bilateral 10/18/2013   Diabetes mellitus, type II, insulin dependent ([place]), on CGM, with end organ damage: renal, neuropathy, gastroparesis, retinopathy 02/19/2011   Hypertension associated with diabetes ([place]) 02/19/2011   Gastroparesis due to DM ([place]) 02/19/2011    Orientation RESPIRATION BLADDER Height & Weight     Self, Time, Situation, Place  Normal Continent Weight:   Height:     BEHAVIORAL SYMPTOMS/MOOD NEUROLOGICAL BOWEL NUTRITION STATUS      Continent Diet (See d/c summary)  AMBULATORY STATUS COMMUNICATION OF NEEDS Skin   Extensive Assist Verbally Normal                       Personal Care Assistance Level of Assistance  Bathing, Feeding, Dressing Bathing Assistance: Maximum assistance Feeding assistance: Limited assistance Dressing Assistance: Maximum assistance     Functional Limitations Info  Sight, Hearing, Speech Sight Info: Adequate Hearing Info: Adequate Speech Info: Adequate    SPECIAL CARE FACTORS FREQUENCY  PT (By licensed PT), OT (By licensed OT)     PT Frequency: 5x/wk OT Frequency: 5x/wk            Contractures Contractures Info: Not present    Additional Factors Info  Code  Status, Allergies Code Status Info: Full Allergies Info: Ibuprofen   Other (See Comments)  High  Contraindication  11/27/2013  CKD stage 3. Should avoid.  Iodinated Contrast Media   Rash  Low  08/06/2021  Tramadol   Itching  Low  Allergy  07/21/2018  Adverse Reactions/Drug Intolerances     Dilaudid (Hydromorphone  Hcl)           Current Medications (12/15/2022):  This is the current hospital active medication list Current Facility-Administered Medications  Medication Dose Route Frequency Provider Last Rate Last Admin   0.9 %  sodium chloride infusion   Intravenous PRN Dwyane Dee, MD   Stopped at 12/15/22 0031   acetaminophen (TYLENOL) tablet 650 mg  650 mg Oral Q4H PRN Tu, Ching T, DO       Or   acetaminophen (TYLENOL) 160 MG/5ML solution 650 mg  650 mg Per Tube Q4H PRN Tu, Ching T, DO       Or   acetaminophen (TYLENOL) suppository 650 mg  650 mg Rectal Q4H PRN Tu, Ching T, DO       aspirin suppository 300 mg  300 mg Rectal Daily Camelia Phenes, MD   300 mg at 12/14/22 1423   ceFEPIme (MAXIPIME) 1 g in sodium chloride 0.9 % 100 mL IVPB  1 g Intravenous Q24H Mignon Pine, DO 200 mL/hr at 12/14/22 2325 1 g at 12/14/22 2325   Chlorhexidine Gluconate Cloth 2 % PADS 6 each  6 each Topical Q0600 Donato Heinz, MD   6 each at 12/15/22 0532   heparin injection 5,000 Units  5,000 Units Subcutaneous Q8H Tu, Ching T, DO   5,000 Units at 12/14/22 2133   insulin aspart (novoLOG) injection 0-15 Units  0-15 Units Subcutaneous TID PC & HS Tu, Ching T, DO   2 Units at 12/14/22 1809   labetalol (NORMODYNE) injection 10 mg  10 mg Intravenous Q2H PRN Tu, Ching T, DO   10 mg at 12/14/22 0418   sodium chloride flush (NS) 0.9 % injection 10-40 mL  10-40 mL Intracatheter Q12H Barb Merino, MD   10 mL at 12/14/22 2134   sodium chloride flush (NS) 0.9 % injection 10-40 mL  10-40 mL Intracatheter PRN Barb Merino, MD       vancomycin (VANCOCIN) IVPB 1000 mg/200 mL premix  1,000 mg Intravenous Q M,W,F-HD [facility], [facility]         Discharge Medications: Please see discharge summary for a list of discharge medications.  Relevant Imaging Results:  Relevant Lab Results:   Additional Information SS#: [id number]  Jinger Neighbors, LCSW

## 2022-12-15 NOTE — Progress Notes (Signed)
Occupational Therapy Treatment Patient Details Name: Heather [name] MRN: [id number] DOB: 10/13/1971 Today's Date: 12/15/2022   History of present illness 51-year-old admitted with new CVAs.  CT scan of the head with left paramedian frontal parietal region infarct.  MRI showed new 9 mm posterior left cerebral infarct.  Pt with PMH of cardiac arrest, stroke, ESRD on hemodialysis Monday Wednesday Friday, insulin-dependent type 2 diabetes with gastroparesis, PE and DVT on Eliquis, hypertension, right below-knee amputation who was brought from home with altered mental status and aphasia.  Hospitalization 10/21/2022-10/24/2022 with acute infarct left paramedian frontoparietal region.   OT comments  Pt verbal throughout session with EEG leads in place.  Decreased initiation at times with motor planning deficits noted when attempting to tie her gown placed on like a robe.  Min assist for transfer to the EOB with HOB elevated and mod assist for squat pivot transfer to the wheelchair on second attempt.  Pt did not initiate on first attempt.  Feel she is progressing well and would continue to benefit from acute care OT to increase overall ADL independence, awareness, and problem solving.  Recommend SNF for follow-up rehab post acute unless 24 hr supervision can be arranged.     Recommendations for follow up therapy are one component of a multi-disciplinary discharge planning process, led by the attending physician.  Recommendations may be updated based on patient status, additional functional criteria and insurance authorization.    Follow Up Recommendations  Skilled nursing-short term rehab (<3 hours/day)     Assistance Recommended at Discharge Frequent or constant Supervision/Assistance  Patient can return home with the following  A lot of help with walking and/or transfers;A lot of help with bathing/dressing/bathroom;Help with stairs or ramp for entrance;Direct supervision/assist for medications  management;Assistance with feeding;Assist for transportation;Direct supervision/assist for financial management   Equipment Recommendations  Other (comment) (TBD next venue of care)       Precautions / Restrictions Precautions Precautions: Fall Precaution Comments: R BKA Restrictions Weight Bearing Restrictions: No RLE Weight Bearing: Weight bearing as tolerated       Mobility Bed Mobility Overal bed mobility: Needs Assistance Bed Mobility: Supine to Sit     Supine to sit: Min assist (increased time needed to process and complete)          Transfers Overall transfer level: Needs assistance Equipment used: None Transfers: Bed to chair/wheelchair/BSC     Squat pivot transfers: Mod assist       General transfer comment: Pt needed two attempts to complete squat pivot to the wheelchair.     Balance Overall balance assessment: Needs assistance Sitting-balance support: Single extremity supported Sitting balance-Leahy Scale: Good Sitting balance - Comments: Pt able to maintain balance while donning gripper sock       Standing balance comment: not tested                           ADL either performed or assessed with clinical judgement   ADL Overall ADL's : Needs assistance/impaired                     Lower Body Dressing: Minimal assistance Lower Body Dressing Details (indicate cue type and reason): sitting to donn gripper sock on the left foot in sitting Toilet Transfer: Moderate assistance;Squat-pivot Toilet Transfer Details (indicate cue type and reason): simulated squat pivot to wheelchair on the left side         Functional mobility during ADLs: Moderate   assistance;Wheelchair (squat pivot to the wheelchair) General ADL Comments: Pt verbal throughout session.  Decreased initiation and slow carry through for bed mobility and tasks sitting EOB.  Noted motor planning difficulty when attempting to tie her gown, requiring max assist to  complete, even thought she has AROM and strength at 4/5 in the hand.  Slower processing noted when attempting scooting to the EOB and for transfer squat pivot to the wheelchair.      Cognition Arousal/Alertness: Awake/alert Behavior During Therapy: WFL for tasks assessed/performed Overall Cognitive Status: Impaired/Different from baseline Area of Impairment: Awareness, Following commands, Memory, Orientation, Attention, Safety/judgement                 Orientation Level: Time (day of the week and month) Current Attention Level: Sustained Memory: Decreased short-term memory Following Commands: Follows one step commands with increased time, Follows multi-step commands with increased time Safety/Judgement: Decreased awareness of deficits Awareness: Intellectual Problem Solving: Slow processing, Decreased initiation, Difficulty sequencing, Requires verbal cues General Comments: Pt needed increased time for processing and initiation of functional commands.  Noted motor planning and initiation deficits.  She was verbal throughout session which was an improvement from initial eval.  When asked what the day of the week was, she stated "Sunday".  When told it was 3 days later and asked what day would it be she was unable to problem solve it.                   Pertinent Vitals/ Pain       Pain Assessment Pain Assessment: Faces Pain Score: 0-No pain         Frequency  Min 2X/week        Progress Toward Goals  OT Goals(current goals can now be found in the care plan section)  Progress towards OT goals: Goals met and updated - see care plan  Acute Rehab OT Goals Patient Stated Goal: Pt agreeable to therapy Time For Goal Achievement: 12/29/22 Potential to Achieve Goals: Good  Plan Discharge plan remains appropriate       AM-PAC OT "6 Clicks" Daily Activity     Outcome Measure   Help from another person eating meals?: A Little Help from another person taking care of  personal grooming?: A Little Help from another person toileting, which includes using toliet, bedpan, or urinal?: A Lot Help from another person bathing (including washing, rinsing, drying)?: A Lot Help from another person to put on and taking off regular upper body clothing?: A Little Help from another person to put on and taking off regular lower body clothing?: A Lot 6 Click Score: 15    End of Session Equipment Utilized During Treatment: Other (comment) (wheelchair)  OT Visit Diagnosis: Unsteadiness on feet (R26.81);Muscle weakness (generalized) (M62.81);Other abnormalities of gait and mobility (R26.89);Cognitive communication deficit (R41.841);Other symptoms and signs involving cognitive function Symptoms and signs involving cognitive functions: Cerebral infarction   Activity Tolerance Patient tolerated treatment well   Patient Left in chair;with call bell/phone within reach;with family/visitor present   Nurse Communication Mobility status        Time: 1048-1120 OT Time Calculation (min): 32 min  Charges: OT General Charges $OT Visit: 1 Visit OT Treatments $Self Care/Home Management : 23-37 mins  MCGUIRE,JAMES OTR/L 12/15/2022, 1:14 PM 

## 2022-12-15 NOTE — Progress Notes (Signed)
Progress Note    ALANTRA POPOCA   LPF:[id number]  DOB: [birth year]  DOA: 12/12/2022     2 PCP: [name], MD  Initial CC: AMS, weakness  Hospital Course: [name] is a 51 yo female with PMH CVAs, ESRD on HD, DMII, gastroparesis, PE/DVT, HTN, RBKA who presented with altered mentation and aphasia.  Her husband found her lethargic, confused, and weak. She was hospitalized in October 2023 for acute CVA in multiple regions with suspected embolic source (cardiac).  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 MRI brain on admission was positive again for acute CVA.  She was found to have infarcts involving right occipital lobe and left cerebellar. She had significant effect from her stroke this hospitalization with becoming encephalopathic, nonverbal, aphasic, failing swallow evaluation, unable to follow commands. With further clinical monitoring, her mentation did clear and she was able to awaken.  Plan of care was discussed with patient and her husband; they wished for remaining aggressive with pursuit of further workup regarding stroke etiology.  Interval History:  Dramatic improvement in mentation since yesterday.  [name] awake and alert today.  [name] oriented to all but the year.  She also passed repeat swallow eval and was started on a dysphagia diet. Goals of care discussed and they wish to pursue further workup regarding etiology of her stroke and further cardiac workup as indicated and able.  Assessment and Plan: * Stroke [facility]) -presented with encephalopathy and aphasia; she was nonverbal, unable to follow commands and failed SLP eval initially  - MRI Brain: 2 foci of restricted diffusion within the subcortical white matter of the right occipital pole consistent with acute/subacute nonhemorrhagic infarcts. 9 mm posterior left cerebellar infarct is new  since the prior MRI, but not acute. Expected evolution of nonhemorrhagic infarct in the medial left frontal parietal cortex. Moderate generalized atrophy and advanced white matter disease likely reflects the sequela of chronic microvascular ischemia. - underlying cardiac mass still seen on repeat echo; seen by CTS in May 2023 when had thrombus at that time - clinically as of 12/20, she's improved and regained consciousness but still has some confusion but tremendously better compared to admission.  Husband would still like to have palliative care consulted but they do wish to remain aggressive in terms of further workup as needed and recommended -Therefore, will consult cardiology and cardiothoracic surgery for further input regarding underlying mitral valve mass; ID also following, see below -Reevaluated by SLP on 12/15/2022 and patient passed, dysphagia level 3 diet started - PT/OT also following and tentative plan possibly for SNF although patient still hoping to return home.  Further discussions to take place between patient and her husband for final disposition planning - start plavix now that taking PO; continue asa x 21 days then plavix monotherapy (discrepancy noted in neuro note, but I favor plavix monotherapy going forward given recurrent CVA)  Goals of care, counseling/discussion - Patient now presenting with recurrent stroke and had lingering encephalopathy until 12/15/22. She had aphasia, encephalopathy, and was nonverbal, failing swallow eval's as well.  She rapidly improved on 12/15/2022 and passed repeat SLP eval -Given still underlying risk for recurrent embolic phenomenon, palliative care consult still appropriate and husband also still wishing to keep consult in place.  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 Kipp Brood on 05/14/2022 and given that she had history of multiple PEA arrests and her underlying comorbidities, she was not felt to be a good surgical candidate)  Acute metabolic encephalopathy - EEG negative; continuous EEG underway but seizure not felt to be etiology at this time - recurrent CVA felt to be etiology or some metabolic derangement. Nephrology concerned for baclofen use but I do not see on med history during hospitalization nor on home med rec, so not sure about use of this; regardless we will be judicious with any psychotropic agents, especially renally cleared - mentation dramatically improved on 12/15/22; she is awake/alert and mostly oriented except to the year   ESRD on hemodialysis ([place]) -HD MWF at home. Has been compliant. Currently on alternate schedule in hospital -Nephrology following during hospitalization for ongoing HD  PVD (peripheral vascular disease) ([place]) S/p right BKA. Wheelchair bound.   S/P BKA (below knee amputation), right ([place]) - noted; stable  Anemia in chronic kidney disease, on chronic dialysis ([place]) -Hgb stable around her baseline  Hypertension associated with diabetes ([place]) - s/p permissive HTN - will start to gradually bring down; also will have some control with HD too  Diabetes mellitus, type II, insulin dependent ([place]), on CGM, with end organ damage: renal, neuropathy, gastroparesis, retinopathy - Hemoglobin A1c on 09/2022 of 7.4 - continue SSI   Old records reviewed in assessment of this patient  Antimicrobials: Vancomycin 12/13/2022 >> current Cefepime  12/14/2022 >> current  DVT prophylaxis:  heparin injection 5,000 Units Start: 12/12/22 2230   Code Status:   Code Status: Full Code  Mobility Assessment (last 72 hours)     Mobility Assessment     Row Name 12/15/22 1308 12/14/22 1934 12/14/22 1517 12/13/22 2210 12/13/22 1356   Does patient have an order for bedrest or is patient medically unstable -- No - Continue assessment -- -- --   What is the highest level of mobility based on the progressive mobility assessment? Level 2 ([place]) - Balance while sitting on edge of bed and cannot stand Level 1 (Bedfast) - Unable to balance while sitting on edge of bed Level 1 (Bedfast) - Unable to balance while sitting on edge of bed Level 1 (Bedfast) - Unable to balance while sitting on edge of bed Level 1 (Bedfast) - Unable to balance while sitting on edge of bed   Is the above level different from baseline mobility prior to current illness? -- No - Consider discontinuing PT/OT -- No - Consider discontinuing PT/OT --            Barriers to discharge:  Disposition Plan: SNF vs Home pending further patient discussions Status is: Inpatient  Objective: Blood pressure 135/81, pulse 97, temperature 98.4 F (36.9 C), temperature source Oral, resp. rate 16, last menstrual period 09/10/2021, SpO2 96 %.  Examination:  Physical Exam Constitutional:      Comments: Now awake, alert, and talking   HENT:     Head: Normocephalic.     Mouth/Throat:     Mouth: Mucous membranes are  moist.  Eyes:     Extraocular Movements: Extraocular movements intact.  Cardiovascular:     Rate and Rhythm: Normal rate and regular rhythm.  Pulmonary:     Effort: Pulmonary effort is normal.     Breath sounds: Normal breath sounds.  Abdominal:     General: Bowel sounds are normal. There is no distension.     Palpations: Abdomen is soft.  Musculoskeletal:        General: No swelling.     Cervical back: Normal range of motion.     Comments: R BKA noted  Skin:     General: Skin is warm and dry.  Neurological:     Comments: Now has essentially symmetric strength in all 4 extremities. No paresthesias per patient.   Psychiatric:        Mood and Affect: Mood normal.      Consultants:  Neurology Infectious disease Palliative care Cardiology Cardio thoracic surgery  Procedures:    Data Reviewed: Results for orders placed or performed during the hospital encounter of 12/12/22 (from the past 24 hour(s))  Renal function panel     Status: Abnormal   Collection Time: 12/14/22  7:23 PM  Result Value Ref Range   Sodium 134 (L) 135 - 145 mmol/L   Potassium 4.2 3.5 - 5.1 mmol/L   Chloride 96 (L) 98 - 111 mmol/L   CO2 25 22 - 32 mmol/L   Glucose, Bld 98 70 - 99 mg/dL   BUN 24 (H) 6 - 20 mg/dL   Creatinine, Ser 7.24 (H) 0.44 - 1.00 mg/dL   Calcium 8.8 (L) 8.9 - 10.3 mg/dL   Phosphorus 4.8 (H) 2.5 - 4.6 mg/dL   Albumin 3.1 (L) 3.5 - 5.0 g/dL   GFR, Estimated 6 (L) >60 mL/min   Anion gap 13 5 - 15  Glucose, capillary     Status: None   Collection Time: 12/14/22  9:59 PM  Result Value Ref Range   Glucose-Capillary 91 70 - 99 mg/dL  CBC with Differential/Platelet     Status: Abnormal   Collection Time: 12/15/22  4:03 AM  Result Value Ref Range   WBC 6.5 4.0 - 10.5 K/uL   RBC 3.74 (L) 3.87 - 5.11 MIL/uL   Hemoglobin 11.3 (L) 12.0 - 15.0 g/dL   HCT 34.4 (L) 36.0 - 46.0 %   MCV 92.0 80.0 - 100.0 fL   MCH 30.2 26.0 - 34.0 pg   MCHC 32.8 30.0 - 36.0 g/dL   RDW 15.1 11.5 - 15.5 %   Platelets 131 (L) 150 - 400 K/uL   nRBC 0.0 0.0 - 0.2 %   Neutrophils Relative % 60 %   Neutro Abs 3.9 1.7 - 7.7 K/uL   Lymphocytes Relative 25 %   Lymphs Abs 1.6 0.7 - 4.0 K/uL   Monocytes Relative 9 %   Monocytes Absolute 0.6 0.1 - 1.0 K/uL   Eosinophils Relative 5 %   Eosinophils Absolute 0.3 0.0 - 0.5 K/uL   Basophils Relative 0 %   Basophils Absolute 0.0 0.0 - 0.1 K/uL   Immature Granulocytes 1 %   Abs Immature Granulocytes 0.03 0.00 - 0.07 K/uL  Magnesium      Status: None   Collection Time: 12/15/22  4:03 AM  Result Value Ref Range   Magnesium 2.0 1.7 - 2.4 mg/dL  Glucose, capillary     Status: None   Collection Time: 12/15/22  6:31 AM  Result Value Ref Range   Glucose-Capillary 96 70 -  99 mg/dL   Comment 1 Notify RN   Glucose, capillary     Status: None   Collection Time: 12/15/22 11:59 AM  Result Value Ref Range   Glucose-Capillary 88 70 - 99 mg/dL   Comment 1 Notify RN    Comment 2 Document in Chart   Glucose, capillary     Status: Abnormal   Collection Time: 12/15/22  4:37 PM  Result Value Ref Range   Glucose-Capillary 153 (H) 70 - 99 mg/dL   Comment 1 Notify RN    Comment 2 Document in Chart    *Note: Due to a large number of results and/or encounters for the requested time period, some results have not been displayed. A complete set of results can be found in Results Review.    I have Reviewed nursing notes, Vitals, and Lab results since pt's last encounter. Pertinent lab results : see above I have ordered labwork to follow up on.  I have reviewed the last note from staff over past 24 hours I have discussed pt's care plan and test results with nursing staff, CM/SW, and other staff as appropriate  Time spent: Greater than 50% of the 55 minute visit was spent in counseling/coordination of care for the patient as laid out in the A&P.   LOS: 2 days   Dwyane Dee, MD [facility] 12/15/2022, 5:14 PM

## 2022-12-15 NOTE — Progress Notes (Signed)
Patient ID: Heather [name], female   DOB: Sep 12, 1971, 51 y.o.   MRN: [id number] S: Pt seen in her room with her husband at the bedside.  [name] sitting up and talking.  Doesn't remember anything until this morning.  No complaints. O:BP (!) 171/95 (BP Location: Right Arm)   Pulse 93   Temp 98.6 F (37 C) (Oral)   Resp 19   LMP 09/10/2021   SpO2 98%   Intake/Output Summary (Last 24 hours) at 12/15/2022 1016 Last data filed at 12/15/2022 0301 Gross per 24 hour  Intake 228.07 ml  Output 2.5 ml  Net 225.57 ml   Intake/Output: I/O last 3 completed shifts: In: 228.1 [I.V.:28.1; IV Piggyback:200] Out: 2.5 [Other:2.5]  Intake/Output this shift:  No intake/output data recorded. Weight change:  Gen: NAD CVS: RRR Resp: CTA Abd: +BS, soft, NT/ND Ext: no edema  Recent Labs  Lab 12/12/22 1356 12/12/22 1419 12/14/22 0354 12/14/22 1923  NA 137 137 140 134*  K 3.9 4.3 4.2 4.2  CL 98 101 99 96*  CO2 27  --  23 25  GLUCOSE 187* 191* 145* 98  BUN 39* 40* 52* 24*  CREATININE 10.31* 10.90* 12.83* 7.24*  ALBUMIN 3.4*  --  3.1* 3.1*  CALCIUM 9.5  --  8.9 8.8*  PHOS  --   --  7.1* 4.8*  AST 24  --   --   --   ALT 61*  --   --   --    Liver Function Tests: Recent Labs  Lab 12/12/22 1356 12/14/22 0354 12/14/22 1923  AST 24  --   --   ALT 61*  --   --   ALKPHOS 108  --   --   BILITOT 0.4  --   --   PROT 7.9  --   --   ALBUMIN 3.4* 3.1* 3.1*   No results for input(s): "LIPASE", "AMYLASE" in the last 168 hours. Recent Labs  Lab 12/12/22 1356  AMMONIA 24   CBC: Recent Labs  Lab 12/12/22 1356 12/12/22 1419 12/14/22 0354 12/15/22 0403  WBC 6.6  --  6.5 6.5  NEUTROABS 4.1  --   --  3.9  HGB 10.4* 13.3 10.4* 11.3*  HCT 34.2* 39.0 33.0* 34.4*  MCV 97.7  --  96.2 92.0  PLT 133*  --  130* 131*   Cardiac Enzymes: No results for input(s): "CKTOTAL", "CKMB", "CKMBINDEX", "TROPONINI" in the last 168 hours. CBG: Recent Labs  Lab 12/14/22 0605 12/14/22 1253  12/14/22 1649 12/14/22 2159 12/15/22 0631  GLUCAP 145* 116* 124* 91 96    Iron Studies: No results for input(s): "IRON", "TIBC", "TRANSFERRIN", "FERRITIN" in the last 72 hours. Studies/Results: Overnight EEG with video  Result Date: 12/14/2022 Lora Havens, MD     12/14/2022  1:52 PM Patient Name: Heather [name] MRN: [id number] Epilepsy Attending: Lora Havens Referring Physician/Provider: Barb Merino, MD Duration: 12/13/2022 1531 to 12/14/2022 0865, 12/14/2022 1241 to 0150  Patient history: 51 year old female with altered mental status.  EEG to evaluate for seizure.  Level of alertness: Awake  AEDs during EEG study: None  Technical aspects: This EEG study was done with scalp electrodes positioned according to the 10-20 International system of electrode placement. Electrical activity was reviewed with band pass filter of 1-'70Hz'$ , sensitivity of 7 uV/mm, display speed of 65m/sec with a '60Hz'$  notched filter applied as appropriate. EEG data were recorded continuously and digitally stored.  Video monitoring was available and reviewed as appropriate.  Description: No clear posterior dominant rhythm was seen. EEG showed continuous generalized 3-5 Hz theta-delta slowing. Generalized periodic discharges with triphasic morphology were also noted at 1 to 1.5 Hz, more prominent when awake/stimulated. Hyperventilation and photic stimulation were not performed.   EEG was disconnected between 12/14/2022 0729 to 1241 for dialysis.  ABNORMALITY - Periodic discharges with triphasic morphology, generalized ( GPDs) - Continuous slow, generalized  IMPRESSION: This study showed generalized periodic discharges with triphasic morphology likely due to toxic -metabolic causes. Additionally there is moderate diffuse encephalopathy, nonspecific etiology. No seizures were seen throughout the recording.  Lora Havens  ECHOCARDIOGRAM COMPLETE  Result Date: 12/13/2022    ECHOCARDIOGRAM REPORT   Patient Name:    Heather [name] Date of Exam: 12/13/2022 Medical Rec #:  [id number]            Height:       69.0 in Accession #:    [id number]           Weight:       227.0 lb Date of Birth:  1971/08/25           BSA:          2.180 m Patient Age:    79 years             BP:           203/118 mmHg Patient Gender: F                    HR:           89 bpm. Exam Location:  Inpatient Procedure: 2D Echo, Color Doppler and Cardiac Doppler Indications:    Stroke i63.9  History:        Patient has prior history of Echocardiogram examinations, most                 recent 09/30/2022. Risk Factors:Hypertension, Diabetes,                 Dyslipidemia and ESRD. History of MV Vegetation.  Sonographer:    Raquel Sarna Senior RDCS Referring Phys: [id number] [place] T TU  Sonographer Comments: Very difficult study due to body habitus and near constant patient movement. IMPRESSIONS  1. There is a mass attached to the posterior MV calcified annulus that measures 1.9 cm x 0.5 cm. This is highly mobile. This is similar to the prior echo as this was previously present. The mitral valve is degenerative. Trivial mitral valve regurgitation. Mild mitral stenosis. The mean mitral valve gradient is 5.0 mmHg with average heart rate of 87 bpm. Moderate to severe mitral annular calcification.  2. Left ventricular ejection fraction, by estimation, is 60 to 65%. The left ventricle has normal function. The left ventricle has no regional wall motion abnormalities. There is mild asymmetric left ventricular hypertrophy of the basal-septal segment. Left ventricular diastolic function could not be evaluated.  3. Right ventricular systolic function is normal. The right ventricular size is normal. Tricuspid regurgitation signal is inadequate for assessing PA pressure.  4. A small pericardial effusion is present. The pericardial effusion is circumferential.  5. The aortic valve is tricuspid. There is mild calcification of the aortic valve. There is mild thickening of the aortic  valve. Aortic valve regurgitation is not visualized. Aortic valve sclerosis/calcification is present, without any evidence of aortic stenosis. Comparison(s): No significant change from prior study. FINDINGS  Left Ventricle: Left ventricular ejection fraction, by estimation, is 60 to 65%. The left ventricle has  normal function. The left ventricle has no regional wall motion abnormalities. The left ventricular internal cavity size was normal in size. There is  mild asymmetric left ventricular hypertrophy of the basal-septal segment. Left ventricular diastolic function could not be evaluated due to mitral annular calcification (moderate or greater). Left ventricular diastolic function could not be evaluated. Right Ventricle: The right ventricular size is normal. No increase in right ventricular wall thickness. Right ventricular systolic function is normal. Tricuspid regurgitation signal is inadequate for assessing PA pressure. Left Atrium: Left atrial size was normal in size. Right Atrium: Right atrial size was normal in size. Pericardium: A small pericardial effusion is present. The pericardial effusion is circumferential. Mitral Valve: There is a mass attached to the posterior MV calcified annulus that measures 1.9 cm x 0.5 cm. This is highly mobile. This is similar to the prior echo as this was previously present. The mitral valve is degenerative in appearance. Moderate to severe mitral annular calcification. Trivial mitral valve regurgitation. Mild mitral valve stenosis. MV peak gradient, 9.1 mmHg. The mean mitral valve gradient is 5.0 mmHg with average heart rate of 87 bpm. Tricuspid Valve: The tricuspid valve is grossly normal. Tricuspid valve regurgitation is not demonstrated. No evidence of tricuspid stenosis. Aortic Valve: The aortic valve is tricuspid. There is mild calcification of the aortic valve. There is mild thickening of the aortic valve. Aortic valve regurgitation is not visualized. Aortic valve  sclerosis/calcification is present, without any evidence of aortic stenosis. Pulmonic Valve: The pulmonic valve was grossly normal. Pulmonic valve regurgitation is not visualized. No evidence of pulmonic stenosis. Aorta: The aortic root and ascending aorta are structurally normal, with no evidence of dilitation. Venous: The inferior vena cava was not well visualized. IAS/Shunts: The atrial septum is grossly normal.  LEFT VENTRICLE PLAX 2D LVIDd:         3.80 cm   Diastology LVIDs:         2.50 cm   LV e' medial:    3.70 cm/s LV PW:         1.00 cm   LV E/e' medial:  28.4 LV IVS:        1.40 cm   LV e' lateral:   4.79 cm/s LVOT diam:     1.70 cm   LV E/e' lateral: 21.9 LV SV:         37 LV SV Index:   17 LVOT Area:     2.27 cm  RIGHT VENTRICLE RV S prime:     10.60 cm/s TAPSE (M-mode): 1.2 cm LEFT ATRIUM           Index        RIGHT ATRIUM           Index LA diam:      3.80 cm 1.74 cm/m   RA Area:     14.40 cm LA Vol (A4C): 48.8 ml 22.38 ml/m  RA Volume:   33.20 ml  15.23 ml/m  AORTIC VALVE LVOT Vmax:   90.50 cm/s LVOT Vmean:  60.900 cm/s LVOT VTI:    0.162 m  AORTA Ao Root diam: 2.80 cm Ao Asc diam:  3.20 cm MITRAL VALVE MV Area (PHT): 2.42 cm     SHUNTS MV Area VTI:   1.43 cm     Systemic VTI:  0.16 m MV Peak grad:  9.1 mmHg     Systemic Diam: 1.70 cm MV Mean grad:  5.0 mmHg MV Vmax:       1.51  m/s MV Vmean:      101.0 cm/s MV Decel Time: 313 msec MV E velocity: 105.00 cm/s MV A velocity: 138.00 cm/s MV E/A ratio:  0.76 Eleonore Chiquito MD Electronically signed by Eleonore Chiquito MD Signature Date/Time: 12/13/2022/12:07:02 PM    Final     aspirin  300 mg Rectal Daily   Chlorhexidine Gluconate Cloth  6 each Topical Q0600   heparin  5,000 Units Subcutaneous Q8H   insulin aspart  0-15 Units Subcutaneous TID PC & HS   sodium chloride flush  10-40 mL Intracatheter Q12H    BMET    Component Value Date/Time   NA 134 (L) 12/14/2022 1923   NA 133 (A) 03/20/2019 0000   K 4.2 12/14/2022 1923   K 4.3 10/16/2013  0000   CL 96 (L) 12/14/2022 1923   CL 100 10/16/2013 0000   CO2 25 12/14/2022 1923   GLUCOSE 98 12/14/2022 1923   BUN 24 (H) 12/14/2022 1923   BUN 41 (A) 03/20/2019 0000   CREATININE 7.24 (H) 12/14/2022 1923   CREATININE 3.05 (H) 09/03/2015 0855   CALCIUM 8.8 (L) 12/14/2022 1923   CALCIUM 8.9 10/16/2013 0000   GFRNONAA 6 (L) 12/14/2022 1923   GFRNONAA 18 (L) 09/03/2015 0855   GFRAA 4 (L) 05/07/2020 0819   GFRAA 20 (L) 09/03/2015 0855   CBC    Component Value Date/Time   WBC 6.5 12/15/2022 0403   RBC 3.74 (L) 12/15/2022 0403   HGB 11.3 (L) 12/15/2022 0403   HCT 34.4 (L) 12/15/2022 0403   PLT 131 (L) 12/15/2022 0403   MCV 92.0 12/15/2022 0403   MCH 30.2 12/15/2022 0403   MCHC 32.8 12/15/2022 0403   RDW 15.1 12/15/2022 0403   LYMPHSABS 1.6 12/15/2022 0403   MONOABS 0.6 12/15/2022 0403   EOSABS 0.3 12/15/2022 0403   BASOSABS 0.0 12/15/2022 0403    Dialysis Orders:  Center: [facility]  on MWF . 180NRe 3 hr 45 min BFR 400 DFR 700 EDW 102.3kg 2K 2 Ca TDC Heparin 7000 unit bolus Venoefr '50mg'$  weekly Gectorol 46mg IV q HD     Assessment/Plan:  New embolic stroke: neurology following. Recent endocarditis. HD center did have to use ceftaz at one point due to cefepime shortage but appears she otherwise completed her IV abx. Repeat echo pending. Management per primary team/neurology. Acute encephalopathy: new embolic stroke on MRI but also took baclofen which is contraindicated in ESRD. Stop baclofen and continue with HD.  Thankfully she has woken up and is speaking.  ESRD:  On MWF schedule, will plan for HD tomorrow then get back on schedule Sunday (holiday schedule for MWF patients)   Hypertension/volume: Bp significantly elevated on arrival. Neurology recommended normotensive BP goal. UF with HD as tolerated today.   Anemia: Hgb above goal, no ESA indicated at this time.   Metabolic bone disease: Continue binders once tolerating PO (calcium acetate 3 caps per meal). Continue VDRA   Nutrition:  Currently NPO T2DM: management per primary team  JDonetta Potts MD CDutche[phone]

## 2022-12-15 NOTE — TOC Initial Note (Signed)
Transition of Care [facility]) - Initial/Assessment Note    Patient Details  Name: Heather [name] MRN: [id number] Date of Birth: 11-28-1971  Transition [facility]) CM/SW Contact:    Jinger Neighbors, LCSW Phone Number: 12/15/2022, 10:49 AM  Clinical Narrative:                 CSW met with pt at bedside to complete assessment. Pt aox4 and appears stated age. She was pleasant while engaging with CSW and agreeable to SNF for STR. CSW attempted to contact spouse, Mr. Rosana Hoes and unable to leave a vm. CSW will complete work up and fax out. TOC will continue to follow   Planned Disposition: Skilled Nursing Facility Barriers to Discharge: SNF Pending bed offer   Patient Goals and CMS Choice   CMS Medicare.gov Compare Post Acute Care list provided to:: Patient Choice offered to / list presented to : Patient, [place] ownership interest in [facility].provided to:: Patient    Expected Discharge Plan and Services Planned Disposition: [place]                                              Prior Living Arrangements/Services   Lives with:: Spouse Patient language and need for interpreter reviewed:: Yes Do you feel safe going back to the place where you live?: Yes      Need for Family Participation in Patient Care: Yes (Comment) Care giver support system in place?: Yes (comment)   Criminal Activity/Legal Involvement Pertinent to Current Situation/Hospitalization: No - Comment as needed  Activities of Daily Living Home Assistive Devices/Equipment: Environmental consultant (specify type), Wheelchair ADL Screening (condition at time of admission) Patient's cognitive ability adequate to safely complete daily activities?: No Is the patient deaf or have difficulty hearing?: No Does the patient have difficulty seeing, even when wearing glasses/contacts?: No Does the patient have difficulty concentrating, remembering, or making decisions?: No Patient able to express  need for assistance with ADLs?: Yes Does the patient have difficulty dressing or bathing?: No Independently performs ADLs?: Yes (appropriate for developmental age) Does the patient have difficulty walking or climbing stairs?: No Weakness of Legs: None Weakness of Arms/Hands: None  Permission Sought/Granted                  Emotional Assessment Appearance:: Appears stated age Attitude/Demeanor/Rapport: Engaged Affect (typically observed): Accepting Orientation: : Oriented to Self, Oriented to Place, Oriented to  Time, Oriented to Situation      Admission diagnosis:  Aphasia [R47.01] Encephalopathy [G93.40] Stroke [facility]) [I63.9] ESRD on hemodialysis ([place]) [N18.6, Z99.2] Cerebrovascular accident (CVA), unspecified mechanism ([place]) [I63.9] Patient Active Problem List   Diagnosis Date Noted   Encephalopathy 12/14/2022   Cardiac valve mass 12/14/2022   Goals of care, counseling/discussion 12/14/2022   PVD (peripheral vascular disease) ([place]) 29/79/8921   Acute metabolic encephalopathy 19/41/7408   Stroke ([place]) 10/21/2022   Allergic conjunctivitis 10/12/2022   Mitral valve mass    Right atrial mass    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   Shortness of breath 07/02/2022   Thrombus of venous dialysis catheter ([place]) 02/25/2022   Displacement of vascular dialysis catheter, initial encounter ([place]) 12/18/2021   S/P BKA (below knee amputation), right ([place]) 03/31/2021   Impaired mobility and ADLs 03/25/2021   CVA (cerebral vascular accident) ([place]) 03/04/2021   Pulmonary embolus ([place]) 03/04/2021   Charcot ankle, right 02/23/2021  Hx of cardiac arrest 11/12/2020   Abnormal CT of the chest 09/26/2020   Allergy, unspecified, initial encounter 09/22/2020   Other mechanical complication of other cardiac and vascular devices and implants, initial encounter ([place]) 05/10/2020   Positive ANA (antinuclear antibody) 04/16/2020   ESRD on hemodialysis ([place])    Hemiplegia and hemiparesis following cerebral  infarction affecting left non-dominant side ([place]) 12/29/2018   IBS (irritable bowel syndrome) 08/06/2017   Dyslipidemia associated with type 2 diabetes mellitus ([place]) 06/25/2016   Proliferative diabetic retinopathy ([place]) 09/29/2015   Chronic cough 10/04/2014   B12 deficiency 05/10/2014   Anemia in chronic kidney disease, on chronic dialysis ([place]) 04/26/2014   CKD (chronic kidney disease) stage 5, GFR less than 15 ml/min ([place]) 04/26/2014   Secondary hyperparathyroidism of renal origin ([place]) 11/27/2013   Posterior subcapsular cataract, bilateral 10/18/2013   Diabetes mellitus, type II, insulin dependent ([place]), on CGM, with end organ damage: renal, neuropathy, gastroparesis, retinopathy 02/19/2011   Hypertension associated with diabetes ([place]) 02/19/2011   Gastroparesis due to DM ([place]) 02/19/2011   PCP:  [name], MD Pharmacy:   [id number] [place]Phone: [phone]: [phone]    Social Determinants of Health (SDOH) Social History: SDOH Screenings   Food Insecurity: No Food Insecurity (10/26/2022)  Housing: Low Risk  (10/26/2022)  Transportation Needs: No Transportation Needs (10/26/2022)  Depression (PHQ2-9): Low Risk  (10/26/2022)  Financial Resource Strain: Low Risk  (10/26/2022)  Physical Activity: Inactive (10/26/2022)  Social Connections: Moderately Integrated (10/26/2022)  Stress: No Stress Concern Present (10/26/2022)  Tobacco Use: Low Risk  (12/12/2022)   SDOH Interventions:     Readmission Risk Interventions     No data to display

## 2022-12-15 NOTE — Progress Notes (Signed)
LTM maint complete - no skin breakdown  Multiple leads replaced again, over 69mn spent pasting and re-placing leads  Patient seem to be more aware of the leads that before.  [facility] monitored, Event button test confirmed by [facility].

## 2022-12-15 NOTE — Progress Notes (Signed)
Pt receives out-pt HD at [facility] on MWF. Pt arrives at 5:05 am for 5:15 am chair time. This info was provided to CSW for snf placement purposes. Will assist as needed.   Melven Sartorius Renal Navigator [phone]

## 2022-12-15 NOTE — Progress Notes (Signed)
LTM maint complete - no skin breakdown  Serviced Fp1 Fp2 C3 C4 A 1 A2 [facility] monitored, Event button test confirmed by [facility].

## 2022-12-15 NOTE — Progress Notes (Signed)
Brief ID note:  Discussed yesterday with Dr. Sabino Gasser whom had also discussed with neurology.  Given overall poor prognosis, palliative care consult to discuss goals of care is pending.  Given recurrent cerebral infarcts, she would likely not be a surgical candidate at this time for valve repair.  Will follow along for palliative recommendations while continuing broad-spectrum antibiotics.  Blood cultures at this time remain negative x 4.  If full scope of treatment is desired after discussion with palliative care, will then proceed with TEE and engagement with CVTS for updated recommendations.  Would also query the possibility of noninfectious marantic endocarditis in this setting with multiple negative sets of blood cultures.  Additionally, will defer further ID serologic workup pending palliative discussions as there are no risk factors identified for other culture negative IE such as Coxiella, Bartonella, Brucella, T whipplei, etc.    Heather [name] for Infectious Disease [facility] 12/15/2022, 10:07 AM

## 2022-12-15 NOTE — Plan of Care (Signed)
  Problem: Education: Goal: Knowledge of disease or condition will improve Outcome: Progressing Goal: Knowledge of secondary prevention will improve (MUST DOCUMENT ALL) Outcome: Progressing Goal: Knowledge of patient specific risk factors will improve (Mark N/A or DELETE if not current risk factor) Outcome: Progressing   Problem: Ischemic Stroke/TIA Tissue Perfusion: Goal: Complications of ischemic stroke/TIA will be minimized Outcome: Progressing   Problem: Coping: Goal: Will verbalize positive feelings about self Outcome: Progressing Goal: Will identify appropriate support needs Outcome: Progressing   Problem: Health Behavior/Discharge Planning: Goal: Ability to manage health-related needs will improve Outcome: Progressing Goal: Goals will be collaboratively established with patient/family Outcome: Progressing   Problem: Self-Care: Goal: Ability to participate in self-care as condition permits will improve Outcome: Progressing Goal: Verbalization of feelings and concerns over difficulty with self-care will improve Outcome: Progressing Goal: Ability to communicate needs accurately will improve Outcome: Progressing   Problem: Nutrition: Goal: Risk of aspiration will decrease Outcome: Progressing Goal: Dietary intake will improve Outcome: Progressing   Problem: Education: Goal: Ability to describe self-care measures that may prevent or decrease complications (Diabetes Survival Skills Education) will improve Outcome: Progressing Goal: Individualized Educational Video(s) Outcome: Progressing   Problem: Coping: Goal: Ability to adjust to condition or change in health will improve Outcome: Progressing   Problem: Fluid Volume: Goal: Ability to maintain a balanced intake and output will improve Outcome: Progressing   Problem: Health Behavior/Discharge Planning: Goal: Ability to identify and utilize available resources and services will improve Outcome:  Progressing Goal: Ability to manage health-related needs will improve Outcome: Progressing   Problem: Metabolic: Goal: Ability to maintain appropriate glucose levels will improve Outcome: Progressing   Problem: Nutritional: Goal: Maintenance of adequate nutrition will improve Outcome: Progressing Goal: Progress toward achieving an optimal weight will improve Outcome: Progressing   Problem: Skin Integrity: Goal: Risk for impaired skin integrity will decrease Outcome: Progressing   Problem: Tissue Perfusion: Goal: Adequacy of tissue perfusion will improve Outcome: Progressing   Problem: Education: Goal: Knowledge of General Education information will improve Description: Including pain rating scale, medication(s)/side effects and non-pharmacologic comfort measures Outcome: Progressing   Problem: Health Behavior/Discharge Planning: Goal: Ability to manage health-related needs will improve Outcome: Progressing   Problem: Clinical Measurements: Goal: Ability to maintain clinical measurements within normal limits will improve Outcome: Progressing Goal: Will remain free from infection Outcome: Progressing Goal: Diagnostic test results will improve Outcome: Progressing Goal: Respiratory complications will improve Outcome: Progressing Goal: Cardiovascular complication will be avoided Outcome: Progressing   Problem: Activity: Goal: Risk for activity intolerance will decrease Outcome: Progressing   Problem: Nutrition: Goal: Adequate nutrition will be maintained Outcome: Progressing   Problem: Coping: Goal: Level of anxiety will decrease Outcome: Progressing   Problem: Elimination: Goal: Will not experience complications related to bowel motility Outcome: Progressing Goal: Will not experience complications related to urinary retention Outcome: Progressing   Problem: Pain Managment: Goal: General experience of comfort will improve Outcome: Progressing   Problem:  Safety: Goal: Ability to remain free from injury will improve Outcome: Progressing   Problem: Skin Integrity: Goal: Risk for impaired skin integrity will decrease Outcome: Progressing   

## 2022-12-15 NOTE — Progress Notes (Signed)
    Heather [name] is a 51 y.o. female with medical history significant of Hx of cardiac arrest, CVA, ESRD HD MWF, insulin-dependent T2DM with gastroparesis, PE/DVT on Eliquis, HTN, s/p BKA who presented with AMS, aphasia. 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 TTE this admission with a mass attached to the posterior MV calcified  annulus that measures 1.9 cm x 0.5 cm. It is highly mobile and is similar to the prior echo as this was previously present. [facility] has been requested to perform a transesophageal echocardiogram on [name] for given CVA and valvular mass.    After careful review of history and examination, the risks and benefits of transesophageal echocardiogram have been explained including risks of esophageal damage, perforation (1:10,000 risk), bleeding, pharyngeal hematoma as well as other potential complications associated with conscious sedation including aspiration, arrhythmia, respiratory failure and death. Alternatives to treatment were discussed, questions were answered. Patient is willing to proceed.   Lily Kocher PA-C 12/15/2022 3:35 PM

## 2022-12-15 NOTE — Procedures (Signed)
Patient Name: Heather [name]  MRN: [id number]  Epilepsy Attending: Lora Havens  Referring Physician/Provider: Barb Merino, MD  Duration: 12/14/2022 2030 to 12/15/2022 2030   Patient history: 51 year old female with altered mental status.  EEG to evaluate for seizure.   Level of alertness: Awake   AEDs during EEG study: None   Technical aspects: This EEG study was done with scalp electrodes positioned according to the 10-20 International system of electrode placement. Electrical activity was reviewed with band pass filter of 1-'70Hz'$ , sensitivity of 7 uV/mm, display speed of 18m/sec with a '60Hz'$  notched filter applied as appropriate. EEG data were recorded continuously and digitally stored.  Video monitoring was available and reviewed as appropriate.   Description: No clear posterior dominant rhythm was seen. EEG showed continuous generalized 3-5 Hz theta-delta slowing. At the beginning of the study, generalized periodic discharges with triphasic morphology were also noted at 1 to 2 Hz, more prominent when awake/stimulated.  Gradually, the periodic discharges improved. Hyperventilation and photic stimulation were not performed.      ABNORMALITY - Periodic discharges with triphasic morphology, generalized ( GPDs) - Continuous slow, generalized   IMPRESSION: This study showed generalized periodic discharges with triphasic morphology likely due to toxic -metabolic causes. Additionally there is moderate diffuse encephalopathy, nonspecific etiology. No seizures were seen throughout the recording.   Chanson Teems OBarbra Sarks

## 2022-12-15 NOTE — Progress Notes (Signed)
This nurse came to check patient and patient verbalized "I am not sleeping." Attempted to make conversation but patient did not continue to talk and just nods thereafter.

## 2022-12-15 NOTE — Plan of Care (Signed)
  Problem: Ischemic Stroke/TIA Tissue Perfusion: Goal: Complications of ischemic stroke/TIA will be minimized Outcome: Not Progressing   Problem: Coping: Goal: Will verbalize positive feelings about self Outcome: Not Progressing   Problem: Nutrition: Goal: Risk of aspiration will decrease Outcome: Not Progressing   Problem: Tissue Perfusion: Goal: Adequacy of tissue perfusion will improve Outcome: Not Progressing

## 2022-12-15 NOTE — Assessment & Plan Note (Signed)
-   noted; stable

## 2022-12-15 NOTE — Progress Notes (Signed)
Speech Language Pathology Treatment: Dysphagia  Patient Details Name: Heather [name] MRN: [id number] DOB: [birth year] Today's Date: 12/15/2022 Time: 1200-1220 SLP Time Calculation (min) (ACUTE ONLY): 20 min  Assessment / Plan / Recommendation Clinical Impression  Per RN and OT report, patient is now interacting well, verbally communicating and appears ready to be reassessed for initiating PO diet. When SLP entered room, patient awake, alert and sitting in wheelchair with her husband in room as well. She followed commands well and did verbally communicate but has observed deficits in initiation and both responses and movements are slow. She was able to feed self water via cup and straw, eat applesauce via spoon and eat saltine crackers. She appeared with delays during oral phase with solids and liquids but swallow initiation appeared timely and no overt s/s aspiration or penetration. Patient was easily distracted by things she saw, such as TV which was on in room. SLP recommending initiate PO diet of Dys 3 (mechanical soft) solids and thin liquids and will follow patient for toleration.   HPI HPI: 51 year old presenting 12/17 with AMS and trouble speaking. [name] admitted with new CVAs. CT scan of the head with left paramedian frontal parietal region infarct. MRI showed new 9 mm posterior left cerebral infarct. Hospitalization 10/21/2022-10/24/2022 with acute infarct left paramedian frontoparietal region (scored 24/30 on [facility]). PMH of cardiac arrest, stroke, GERD, upper airway cough syndrome, PNA, ESRD on HD MWF, insulin-dependent type 2 diabetes with gastroparesis, PE and DVT on Eliquis, HTN, right BKA      SLP Plan  Continue with current plan of care      Recommendations for follow up therapy are one component of a multi-disciplinary discharge planning process, led by the attending physician.  Recommendations may be updated based on patient status, additional functional criteria and  insurance authorization.    Recommendations  Diet recommendations: Dysphagia 3 (mechanical soft);Thin liquid Liquids provided via: Cup;Straw Medication Administration: Whole meds with puree Supervision: Patient able to self feed;Intermittent supervision to cue for compensatory strategies Compensations: Slow rate;Small sips/bites;Minimize environmental distractions Postural Changes and/or Swallow Maneuvers: Seated upright 90 degrees                Oral Care Recommendations: Oral care BID Follow Up Recommendations: Skilled nursing-short term rehab (<3 hours/day) Assistance recommended at discharge: Frequent or constant Supervision/Assistance SLP Visit Diagnosis: Dysphagia, unspecified (R13.10) Plan: Continue with current plan of care           Sonia Baller, MA, CCC-SLP Speech Therapy

## 2022-12-16 ENCOUNTER — Inpatient Hospital Stay (HOSPITAL_COMMUNITY): Payer: Medicare Other | Admitting: Anesthesiology

## 2022-12-16 ENCOUNTER — Encounter (HOSPITAL_COMMUNITY): Payer: Self-pay | Admitting: Family Medicine

## 2022-12-16 ENCOUNTER — Encounter (HOSPITAL_COMMUNITY)
Admission: EM | Disposition: A | Discharge status: Skilled Nursing Facility | Payer: Self-pay | Source: Home / Self Care | Attending: Internal Medicine

## 2022-12-16 ENCOUNTER — Inpatient Hospital Stay (HOSPITAL_COMMUNITY): Payer: Medicare Other

## 2022-12-16 DIAGNOSIS — Z794 Long term (current) use of insulin: Secondary | ICD-10-CM

## 2022-12-16 DIAGNOSIS — E1122 Type 2 diabetes mellitus with diabetic chronic kidney disease: Secondary | ICD-10-CM

## 2022-12-16 DIAGNOSIS — I63431 Cerebral infarction due to embolism of right posterior cerebral artery: Secondary | ICD-10-CM | POA: Diagnosis not present

## 2022-12-16 DIAGNOSIS — I340 Nonrheumatic mitral (valve) insufficiency: Secondary | ICD-10-CM | POA: Diagnosis not present

## 2022-12-16 DIAGNOSIS — Z992 Dependence on renal dialysis: Secondary | ICD-10-CM | POA: Diagnosis not present

## 2022-12-16 DIAGNOSIS — Z515 Encounter for palliative care: Secondary | ICD-10-CM

## 2022-12-16 DIAGNOSIS — I120 Hypertensive chronic kidney disease with stage 5 chronic kidney disease or end stage renal disease: Secondary | ICD-10-CM

## 2022-12-16 DIAGNOSIS — I5189 Other ill-defined heart diseases: Secondary | ICD-10-CM | POA: Diagnosis not present

## 2022-12-16 DIAGNOSIS — G9341 Metabolic encephalopathy: Secondary | ICD-10-CM | POA: Diagnosis not present

## 2022-12-16 DIAGNOSIS — I679 Cerebrovascular disease, unspecified: Secondary | ICD-10-CM

## 2022-12-16 DIAGNOSIS — E1151 Type 2 diabetes mellitus with diabetic peripheral angiopathy without gangrene: Secondary | ICD-10-CM

## 2022-12-16 DIAGNOSIS — D631 Anemia in chronic kidney disease: Secondary | ICD-10-CM

## 2022-12-16 DIAGNOSIS — N186 End stage renal disease: Secondary | ICD-10-CM

## 2022-12-16 DIAGNOSIS — R4182 Altered mental status, unspecified: Secondary | ICD-10-CM | POA: Diagnosis not present

## 2022-12-16 HISTORY — PX: BUBBLE STUDY: SHX6837

## 2022-12-16 HISTORY — PX: TEE WITHOUT CARDIOVERSION: SHX5443

## 2022-12-16 LAB — CBC WITH DIFFERENTIAL/PLATELET
Abs Immature Granulocytes: 0.04 10*3/uL (ref 0.00–0.07)
Basophils Absolute: 0 10*3/uL (ref 0.0–0.1)
Basophils Relative: 0 %
Eosinophils Absolute: 0.3 10*3/uL (ref 0.0–0.5)
Eosinophils Relative: 4 %
HCT: 32.5 % — ABNORMAL LOW (ref 36.0–46.0)
Hemoglobin: 10.5 g/dL — ABNORMAL LOW (ref 12.0–15.0)
Immature Granulocytes: 1 %
Lymphocytes Relative: 20 %
Lymphs Abs: 1.5 10*3/uL (ref 0.7–4.0)
MCH: 30.3 pg (ref 26.0–34.0)
MCHC: 32.3 g/dL (ref 30.0–36.0)
MCV: 93.7 fL (ref 80.0–100.0)
Monocytes Absolute: 0.8 10*3/uL (ref 0.1–1.0)
Monocytes Relative: 11 %
Neutro Abs: 4.9 10*3/uL (ref 1.7–7.7)
Neutrophils Relative %: 64 %
Platelets: 150 10*3/uL (ref 150–400)
RBC: 3.47 MIL/uL — ABNORMAL LOW (ref 3.87–5.11)
RDW: 15.3 % (ref 11.5–15.5)
WBC: 7.6 10*3/uL (ref 4.0–10.5)
nRBC: 0 % (ref 0.0–0.2)

## 2022-12-16 LAB — ECHO TEE
AV Mean grad: 4 mmHg
AV Peak grad: 9.1 mmHg
Ao pk vel: 1.51 m/s

## 2022-12-16 LAB — RENAL FUNCTION PANEL
Albumin: 3.2 g/dL — ABNORMAL LOW (ref 3.5–5.0)
Anion gap: 14 (ref 5–15)
BUN: 47 mg/dL — ABNORMAL HIGH (ref 6–20)
CO2: 23 mmol/L (ref 22–32)
Calcium: 9.1 mg/dL (ref 8.9–10.3)
Chloride: 96 mmol/L — ABNORMAL LOW (ref 98–111)
Creatinine, Ser: 10.84 mg/dL — ABNORMAL HIGH (ref 0.44–1.00)
GFR, Estimated: 4 mL/min — ABNORMAL LOW (ref >60)
Glucose, Bld: 99 mg/dL (ref 70–99)
Phosphorus: 7.7 mg/dL — ABNORMAL HIGH (ref 2.5–4.6)
Potassium: 4.7 mmol/L (ref 3.5–5.1)
Sodium: 133 mmol/L — ABNORMAL LOW (ref 135–145)

## 2022-12-16 LAB — GLUCOSE, CAPILLARY
Glucose-Capillary: 105 mg/dL — ABNORMAL HIGH (ref 70–99)
Glucose-Capillary: 69 mg/dL — ABNORMAL LOW (ref 70–99)
Glucose-Capillary: 118 mg/dL — ABNORMAL HIGH (ref 70–99)
Glucose-Capillary: 77 mg/dL (ref 70–99)
Glucose-Capillary: 178 mg/dL — ABNORMAL HIGH (ref 70–99)

## 2022-12-16 LAB — MAGNESIUM: Magnesium: 2.3 mg/dL (ref 1.7–2.4)

## 2022-12-16 SURGERY — ECHOCARDIOGRAM, TRANSESOPHAGEAL
Anesthesia: Monitor Anesthesia Care

## 2022-12-16 MED ORDER — DIPHENHYDRAMINE HCL 50 MG/ML IJ SOLN
25.0000 mg | Freq: Once | INTRAMUSCULAR | Status: AC | PRN
  Administered 2022-12-16: 25 mg via INTRAVENOUS
  Filled 2022-12-16: qty 1

## 2022-12-16 MED ORDER — LACTATED RINGERS IV SOLN: INTRAVENOUS | Status: DC

## 2022-12-16 MED ORDER — DEXTROSE 50 % IV SOLN
INTRAVENOUS | Status: AC
  Administered 2022-12-16: 50 mL
  Filled 2022-12-16: qty 50

## 2022-12-16 MED ORDER — PROPOFOL 500 MG/50ML IV EMUL
INTRAVENOUS | Status: DC | PRN
  Administered 2022-12-16: 75 ug/kg/min via INTRAVENOUS

## 2022-12-16 MED ORDER — SODIUM CHLORIDE 0.9 % IV SOLN: INTRAVENOUS | Status: DC

## 2022-12-16 MED ORDER — PHENYLEPHRINE 80 MCG/ML (10ML) SYRINGE FOR IV PUSH (FOR BLOOD PRESSURE SUPPORT)
PREFILLED_SYRINGE | INTRAVENOUS | Status: DC | PRN
  Administered 2022-12-16 (×2): 160 ug via INTRAVENOUS

## 2022-12-16 MED ORDER — HEPARIN SODIUM (PORCINE) 1000 UNIT/ML IJ SOLN
INTRAMUSCULAR | Status: AC
  Filled 2022-12-16: qty 4

## 2022-12-16 MED ORDER — ATORVASTATIN CALCIUM 80 MG PO TABS
80.0000 mg | ORAL_TABLET | Freq: Every day | ORAL | Status: DC
  Administered 2022-12-16 – 2022-12-23 (×7): 80 mg via ORAL
  Filled 2022-12-16 (×8): qty 1

## 2022-12-16 MED ORDER — PROPOFOL 10 MG/ML IV BOLUS
INTRAVENOUS | Status: DC | PRN
  Administered 2022-12-16 (×2): 20 mg via INTRAVENOUS

## 2022-12-16 NOTE — Anesthesia Preprocedure Evaluation (Addendum)
Anesthesia Evaluation  Patient identified by MRN, date of birth, ID band Patient awake    Reviewed: Allergy & Precautions, NPO status , Patient's Chart, lab work & pertinent test results  History of Anesthesia Complications Negative for: history of anesthetic complications  Airway Mallampati: II  TM Distance: >3 FB Neck ROM: Full    Dental  (+) Dental Advisory Given   Pulmonary neg pulmonary ROS   Pulmonary exam normal        Cardiovascular hypertension, Pt. on medications + Peripheral Vascular Disease  Normal cardiovascular exam   '23 TTE - There is a mass attached to the posterior MV calcified annulus that measures 1.9 cm x 0.5 cm. This is highly mobile. This is similar to the prior echo as this was previously present. Trivial mitral valve regurgitation. Mild mitral stenosis. The mean mitral valve gradient is 5.0 mmHg with average heart rate of 87 bpm. EF 60 to 65%. There is mild asymmetric left ventricular hypertrophy of the basal-septal segment. A small pericardial effusion is present. The pericardial effusion is circumferential.     Neuro/Psych  Headaches PSYCHIATRIC DISORDERS  Depression    CVA, Residual Symptoms    GI/Hepatic Neg liver ROS,GERD  Medicated and Controlled,,  Endo/Other  diabetes, Type 2, Insulin Dependent   Obesity   Renal/GU ESRF and DialysisRenal disease     Musculoskeletal  (+)  Fibromyalgia -  Abdominal   Peds  Hematology  (+) Blood dyscrasia, anemia   Anesthesia Other Findings   Reproductive/Obstetrics                             Anesthesia Physical Anesthesia Plan  ASA: 4  Anesthesia Plan: MAC   Post-op Pain Management: Minimal or no pain anticipated   Induction:   PONV Risk Score and Plan: 2 and Propofol infusion and Treatment may vary due to age or medical condition  Airway Management Planned: Nasal Cannula and Natural Airway  Additional Equipment:  None  Intra-op Plan:   Post-operative Plan:   Informed Consent: I have reviewed the patients History and Physical, chart, labs and discussed the procedure including the risks, benefits and alternatives for the proposed anesthesia with the patient or authorized representative who has indicated his/her understanding and acceptance.       Plan Discussed with: CRNA and Anesthesiologist  Anesthesia Plan Comments:        Anesthesia Quick Evaluation

## 2022-12-16 NOTE — Progress Notes (Signed)
SLP Cancellation Note  Patient Details Name: Heather [name] MRN: [id number] DOB: 05/10/1971   Cancelled treatment:       Reason Eval/Treat Not Completed: Patient at procedure or test/unavailable;Other (comment) (Patient NPO for TEE)   Sonia Baller, MA, CCC-SLP Speech Therapy

## 2022-12-16 NOTE — H&P (View-Only) (Signed)
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  MRI brain on admission was positive for new possible embolic right occipital infarcts when compared to October.  Neurology was consulted.  She has had issues with encephalopathy, aphasia and dysphagia which is slowly improving.  Nephrology consulted to continue dialysis.  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  Cardiology was consulted for TEE and ID was consulted.  Assessment & Plan:   Acute ischemic right occipital infarcts, possibly embolic Acute metabolic encephalopathy -Presented with encephalopathy, aphasia and was nonverbal and initially failed SLP eval -MRI brain showed new right occipital ischemic infarcts possibly from the valvular mass/vegetation.  Neurology recommended aspirin and Plavix for 3 weeks then aspirin alone. -A1c 7.4.  LDL 135.  Start Lipitor. -EEG negative for seizures.  Still having LTM EEG.  Mental status has improved significantly and currently is following commands and tolerating diet as per SLP. 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  Cardiology was consulted for TEE and ID was consulted.  Cardiothoracic surgery was consulted as well.  Follow recommendations. -Currently on cefepime and vancomycin as per ID recommendations  End-stage renal disease on  hemodialysis -Nephrology following.  Dialysis as per nephrology schedule  History of PVD status post right BKA -Currently wheelchair-bound  Anemia of chronic disease -From renal failure.  Hemoglobin stable.  Monitor intermittently.  Hyponatremia -Managed by nephrology by hemodialysis.  Might  Thrombocytopenia -Questionable cause.  Resolved  Diabetes mellitus type 2 with hyperglycemia -A1c 7.9.  Continue CBGs with SSI  Goals of care -Palliative care consult pending for goals of care discussion  Obesity -Outpatient follow-up   DVT prophylaxis: Heparin subcutaneous Code Status: Full Family Communication: Husband on phone Disposition Plan: Status is: Inpatient Remains inpatient appropriate because: Of severity of illness    Consultants: Nephrology/neurology/cardiothoracic surgery/ID.  Palliative care  Procedures: As above  Antimicrobials:  Anti-infectives (From admission, onward)    Start     Dose/Rate Route Frequency Ordered Stop   12/16/22 1200  vancomycin (VANCOCIN) IVPB 1000 mg/200 mL premix        1,000 mg 200 mL/hr over 60 Minutes Intravenous  Once 12/15/22 1228     12/15/22 1200  vancomycin (VANCOCIN) IVPB 1000 mg/200 mL premix  Status:  Discontinued        1,000 mg 200 mL/hr over 60 Minutes Intravenous Every M-W-F (Hemodialysis) 12/13/22 1823 12/15/22 1228   12/14/22 2200  ceFEPIme (MAXIPIME) 1 g in sodium chloride 0.9 % 100 mL IVPB        1 g 200 mL/hr over 30 Minutes Intravenous Every 24 hours 12/14/22 1322     12/14/22 1500  vancomycin (VANCOCIN) IVPB 1000 mg/200 mL premix        1,000 mg 200 mL/hr over 60 Minutes Intravenous  Once 12/14/22 1329 12/14/22 1532   12/13/22 2200  piperacillin-tazobactam (ZOSYN) IVPB 2.25 g  Status:  Discontinued  2.25 g 100 mL/hr over 30 Minutes Intravenous Every 8 hours 12/13/22 1820 12/14/22 1322   12/13/22 1830  vancomycin (VANCOCIN) 2,250 mg in sodium chloride 0.9 % 500 mL IVPB        2,250 mg 261.3 mL/hr over  120 Minutes Intravenous  Once 12/13/22 1820 12/14/22 0115        Subjective: Patient seen and examined at bedside.  Denies any chest pain, nausea, vomiting or worsening shortness of breath.  Objective: Vitals:   12/16/22 0400 12/16/22 0450 12/16/22 0600 12/16/22 0754  BP:  (!) 147/80  (!) 166/105  Pulse: 91 91  64  Resp: '12 19 13 15  '$ Temp:  97.7 F (36.5 C)  97.7 F (36.5 C)  TempSrc:  Oral  Oral  SpO2:  95%  99%  Weight:      Height:        Intake/Output Summary (Last 24 hours) at 12/16/2022 1039 Last data filed at 12/16/2022 0600 Gross per 24 hour  Intake 680 ml  Output --  Net 680 ml   Filed Weights   12/12/22 1306  Weight: 103 kg    Examination:  General exam: Appears calm and comfortable.  Looks chronically ill and deconditioned.  Currently on room air.  Undergoing LTM EEG Respiratory system: Bilateral decreased breath sounds at bases with some scattered crackles Cardiovascular system: S1 & S2 heard, Rate controlled Gastrointestinal system: Abdomen is obese, nondistended, soft and nontender. Normal bowel sounds heard. Extremities: No cyanosis, clubbing; right BKA present  Central nervous system: Alert and oriented.  Slow to respond but answers questions appropriately.  No focal neurological deficits. Moving extremities Skin: No rashes, lesions or ulcers Psychiatry: Flat affect; not agitated  Data Reviewed: I have personally reviewed following labs and imaging studies  CBC: Recent Labs  Lab 12/12/22 1356 12/12/22 1419 12/14/22 0354 12/15/22 0403 12/16/22 0500  WBC 6.6  --  6.5 6.5 7.6  NEUTROABS 4.1  --   --  3.9 4.9  HGB 10.4* 13.3 10.4* 11.3* 10.5*  HCT 34.2* 39.0 33.0* 34.4* 32.5*  MCV 97.7  --  96.2 92.0 93.7  PLT 133*  --  130* 131* 161   Basic Metabolic Panel: Recent Labs  Lab 12/12/22 1356 12/12/22 1419 12/14/22 0354 12/14/22 1923 12/15/22 0403 12/16/22 0500  NA 137 137 140 134*  --  133*  K 3.9 4.3 4.2 4.2  --  4.7  CL 98 101 99  96*  --  96*  CO2 27  --  23 25  --  23  GLUCOSE 187* 191* 145* 98  --  99  BUN 39* 40* 52* 24*  --  47*  CREATININE 10.31* 10.90* 12.83* 7.24*  --  10.84*  CALCIUM 9.5  --  8.9 8.8*  --  9.1  MG  --   --   --   --  2.0 2.3  PHOS  --   --  7.1* 4.8*  --  7.7*   GFR: Estimated Creatinine Clearance: 7.7 mL/min (A) (by C-G formula based on SCr of 10.84 mg/dL (H)). Liver Function Tests: Recent Labs  Lab 12/12/22 1356 12/14/22 0354 12/14/22 1923 12/16/22 0500  AST 24  --   --   --   ALT 61*  --   --   --   ALKPHOS 108  --   --   --   BILITOT 0.4  --   --   --   PROT 7.9  --   --   --  ALBUMIN 3.4* 3.1* 3.1* 3.2*   No results for input(s): "LIPASE", "AMYLASE" in the last 168 hours. Recent Labs  Lab 12/12/22 1356  AMMONIA 24   Coagulation Profile: No results for input(s): "INR", "PROTIME" in the last 168 hours. Cardiac Enzymes: No results for input(s): "CKTOTAL", "CKMB", "CKMBINDEX", "TROPONINI" in the last 168 hours. BNP (last 3 results) No results for input(s): "PROBNP" in the last 8760 hours. HbA1C: Recent Labs    12/13/22 2055  HGBA1C 7.9*   CBG: Recent Labs  Lab 12/15/22 0631 12/15/22 1159 12/15/22 1637 12/15/22 2152 12/16/22 0625  GLUCAP 96 88 153* 172* 105*   Lipid Profile: Recent Labs    12/13/22 2055  CHOL 212*  HDL 41  LDLCALC 135*  TRIG 178*  CHOLHDL 5.2   Thyroid Function Tests: Recent Labs    12/13/22 2055  TSH 1.630   Anemia Panel: Recent Labs    12/13/22 2055  VITAMINB12 449   Sepsis Labs: No results for input(s): "PROCALCITON", "LATICACIDVEN" in the last 168 hours.  Recent Results (from the past 240 hour(s))  Resp panel by RT-PCR (RSV, Flu A&B, Covid) Anterior Nasal Swab     Status: None   Collection Time: 12/12/22  1:57 PM   Specimen: Anterior Nasal Swab  Result Value Ref Range Status   SARS Coronavirus 2 by RT PCR NEGATIVE NEGATIVE Final    Comment: (NOTE) SARS-CoV-2 target nucleic acids are NOT DETECTED.  The  SARS-CoV-2 RNA is generally detectable in upper respiratory specimens during the acute phase of infection. The lowest concentration of SARS-CoV-2 viral copies this assay can detect is 138 copies/mL. A negative result does not preclude SARS-Cov-2 infection and should not be used as the sole basis for treatment or other patient management decisions. A negative result may occur with  improper specimen collection/handling, submission of specimen other than nasopharyngeal swab, presence of viral mutation(s) within the areas targeted by this assay, and inadequate number of viral copies(<138 copies/mL). A negative result must be combined with clinical observations, patient history, and epidemiological information. The expected result is Negative.  Fact Sheet for Patients:  EntrepreneurPulse.com.au  Fact Sheet for Healthcare Providers:  IncredibleEmployment.be  This test is no t yet approved or cleared by the [place] FDA and  has been authorized for detection and/or diagnosis of SARS-CoV-2 by FDA under an Emergency Use Authorization (EUA). This EUA will remain  in effect (meaning this test can be used) for the duration of the COVID-19 declaration under Section 564(b)(1) of the Act, 21 U.S.C.section 360bbb-3(b)(1), unless the authorization is terminated  or revoked sooner.       Influenza A by PCR NEGATIVE NEGATIVE Final   Influenza B by PCR NEGATIVE NEGATIVE Final    Comment: (NOTE) The Xpert Xpress SARS-CoV-2/FLU/RSV plus assay is intended as an aid in the diagnosis of influenza from Nasopharyngeal swab specimens and should not be used as a sole basis for treatment. Nasal washings and aspirates are unacceptable for Xpert Xpress SARS-CoV-2/FLU/RSV testing.  Fact Sheet for Patients: EntrepreneurPulse.com.au  Fact Sheet for Healthcare Providers: IncredibleEmployment.be  This test is not yet approved or  cleared by the [place] FDA and has been authorized for detection and/or diagnosis of SARS-CoV-2 by FDA under an Emergency Use Authorization (EUA). This EUA will remain in effect (meaning this test can be used) for the duration of the COVID-19 declaration under Section 564(b)(1) of the Act, 21 U.S.C. section 360bbb-3(b)(1), unless the authorization is terminated or revoked.     Resp Syncytial Virus  by PCR NEGATIVE NEGATIVE Final    Comment: (NOTE) Fact Sheet for Patients: EntrepreneurPulse.com.au  Fact Sheet for Healthcare Providers: IncredibleEmployment.be  This test is not yet approved or cleared by the [place] FDA and has been authorized for detection and/or diagnosis of SARS-CoV-2 by FDA under an Emergency Use Authorization (EUA). This EUA will remain in effect (meaning this test can be used) for the duration of the COVID-19 declaration under Section 564(b)(1) of the Act, 21 U.S.C. section 360bbb-3(b)(1), unless the authorization is terminated or revoked.  Performed at [facility], [place] [address], [place], [place] [address]   Culture, blood (Routine X 2) w Reflex to ID Panel     Status: None (Preliminary result)   Collection Time: 12/13/22  8:55 PM   Specimen: BLOOD RIGHT ARM  Result Value Ref Range Status   Specimen Description BLOOD RIGHT ARM  Final   Special Requests   Final    BOTTLES DRAWN AEROBIC ONLY Blood Culture results may not be optimal due to an inadequate volume of blood received in culture bottles   Culture   Final    NO GROWTH 3 DAYS Performed at [facility], [place] [address], [place], [place] [address]    Report Status PENDING  Incomplete  Culture, blood (Routine X 2) w Reflex to ID Panel     Status: None (Preliminary result)   Collection Time: 12/13/22  9:15 PM   Specimen: BLOOD LEFT ARM  Result Value Ref Range Status   Specimen Description BLOOD LEFT ARM  Final   Special Requests   Final     BOTTLES DRAWN AEROBIC AND ANAEROBIC Blood Culture results may not be optimal due to an inadequate volume of blood received in culture bottles   Culture   Final    NO GROWTH 3 DAYS Performed at [facility], [place] [address], [place], [place] [address]    Report Status PENDING  Incomplete  Culture, blood (Routine X 2) w Reflex to ID Panel     Status: None (Preliminary result)   Collection Time: 12/14/22  3:55 AM   Specimen: BLOOD LEFT HAND  Result Value Ref Range Status   Specimen Description BLOOD LEFT HAND  Final   Special Requests   Final    BOTTLES DRAWN AEROBIC ONLY Blood Culture results may not be optimal due to an inadequate volume of blood received in culture bottles   Culture   Final    NO GROWTH 2 DAYS Performed at [facility], [place] [address], [place], [place] [address]    Report Status PENDING  Incomplete  Culture, blood (Routine X 2) w Reflex to ID Panel     Status: None (Preliminary result)   Collection Time: 12/14/22  4:08 AM   Specimen: BLOOD RIGHT HAND  Result Value Ref Range Status   Specimen Description BLOOD RIGHT HAND  Final   Special Requests   Final    BOTTLES DRAWN AEROBIC ONLY Blood Culture results may not be optimal due to an inadequate volume of blood received in culture bottles   Culture   Final    NO GROWTH 2 DAYS Performed at [facility], [place] [address] [place],  [address]    Report Status PENDING  Incomplete         Radiology Studies: Overnight EEG with video  Result Date: 12/14/2022 Lora Havens, MD     12/15/2022 10:26 AM Patient Name: EMILLEE TALSMA MRN: [id number] Epilepsy  Attending: Lora Havens Referring Physician/Provider: Barb Merino, MD Duration: 12/13/2022 1531 to 12/14/2022 0254, 12/14/2022 1241 to 2030  Patient history: 51 year old female with altered mental status.  EEG to evaluate for seizure.  Level of alertness: Awake  AEDs during EEG study: None  Technical aspects: This EEG study was done  with scalp electrodes positioned according to the 10-20 International system of electrode placement. Electrical activity was reviewed with band pass filter of 1-'70Hz'$ , sensitivity of 7 uV/mm, display speed of 23m/sec with a '60Hz'$  notched filter applied as appropriate. EEG data were recorded continuously and digitally stored.  Video monitoring was available and reviewed as appropriate.  Description: No clear posterior dominant rhythm was seen. EEG showed continuous generalized 3-5 Hz theta-delta slowing. Generalized periodic discharges with triphasic morphology were also noted at 1 to 2 Hz, more prominent when awake/stimulated. Hyperventilation and photic stimulation were not performed.   EEG was disconnected between 12/14/2022 0729 to 1241 for dialysis.  ABNORMALITY - Periodic discharges with triphasic morphology, generalized ( GPDs) - Continuous slow, generalized  IMPRESSION: This study showed generalized periodic discharges with triphasic morphology likely due to toxic -metabolic causes. Additionally there is moderate diffuse encephalopathy, nonspecific etiology. No seizures were seen throughout the recording.  Priyanka OBarbra Sarks      Scheduled Meds:  aspirin  81 mg Oral Daily   Chlorhexidine Gluconate Cloth  6 each Topical Q0600   clopidogrel  75 mg Oral Daily   heparin  5,000 Units Subcutaneous Q8H   insulin aspart  0-15 Units Subcutaneous TID PC & HS   sodium chloride flush  10-40 mL Intracatheter Q12H   Continuous Infusions:  ceFEPime (MAXIPIME) IV Stopped (12/16/22 0537)   vancomycin            KAline August MD [facility] 12/16/2022, 10:39 AM

## 2022-12-16 NOTE — Progress Notes (Signed)
PROGRESS NOTE    Heather [name]  HUD:[id number] DOB: [birth year] DOA: 12/12/2022 PCP: [name], MD   Brief Narrative:  51 yo female with past medical history of unspecified CVAs, ESRD on HD, DMII, gastroparesis, PE/DVT, HTN, RBKA, hospitalized in October 2 103 for acute CVA and multiple reasons with 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  MRI brain on admission was positive for new possible embolic right occipital infarcts when compared to October.  Neurology was consulted.  She has had issues with encephalopathy, aphasia and dysphagia which is slowly improving.  Nephrology consulted to continue dialysis.  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  Cardiology was consulted for TEE and ID was consulted.  Assessment & Plan:   Acute ischemic right occipital infarcts, possibly embolic Acute metabolic encephalopathy -Presented with encephalopathy, aphasia and was nonverbal and initially failed SLP eval -MRI brain showed new right occipital ischemic infarcts possibly from the valvular mass/vegetation.  Neurology recommended aspirin and Plavix for 3 weeks then aspirin alone. -A1c 7.4.  LDL 135.  Start Lipitor. -EEG negative for seizures.  Still having LTM EEG.  Mental status has improved significantly and currently is following commands and tolerating diet as per SLP. 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  Cardiology was consulted for TEE and ID was consulted.  Cardiothoracic surgery was consulted as well.  Follow recommendations. -Currently on cefepime and vancomycin as per ID recommendations  End-stage renal disease on  hemodialysis -Nephrology following.  Dialysis as per nephrology schedule  History of PVD status post right BKA -Currently wheelchair-bound  Anemia of chronic disease -From renal failure.  Hemoglobin stable.  Monitor intermittently.  Hyponatremia -Managed by nephrology by hemodialysis.  Might  Thrombocytopenia -Questionable cause.  Resolved  Diabetes mellitus type 2 with hyperglycemia -A1c 7.9.  Continue CBGs with SSI  Goals of care -Palliative care consult pending for goals of care discussion  Obesity -Outpatient follow-up   DVT prophylaxis: Heparin subcutaneous Code Status: Full Family Communication: Husband on phone Disposition Plan: Status is: Inpatient Remains inpatient appropriate because: Of severity of illness    Consultants: Nephrology/neurology/cardiothoracic surgery/ID.  Palliative care  Procedures: As above  Antimicrobials:  Anti-infectives (From admission, onward)    Start     Dose/Rate Route Frequency Ordered Stop   12/16/22 1200  vancomycin (VANCOCIN) IVPB 1000 mg/200 mL premix        1,000 mg 200 mL/hr over 60 Minutes Intravenous  Once 12/15/22 1228     12/15/22 1200  vancomycin (VANCOCIN) IVPB 1000 mg/200 mL premix  Status:  Discontinued        1,000 mg 200 mL/hr over 60 Minutes Intravenous Every M-W-F (Hemodialysis) 12/13/22 1823 12/15/22 1228   12/14/22 2200  ceFEPIme (MAXIPIME) 1 g in sodium chloride 0.9 % 100 mL IVPB        1 g 200 mL/hr over 30 Minutes Intravenous Every 24 hours 12/14/22 1322     12/14/22 1500  vancomycin (VANCOCIN) IVPB 1000 mg/200 mL premix        1,000 mg 200 mL/hr over 60 Minutes Intravenous  Once 12/14/22 1329 12/14/22 1532   12/13/22 2200  piperacillin-tazobactam (ZOSYN) IVPB 2.25 g  Status:  Discontinued  2.25 g 100 mL/hr over 30 Minutes Intravenous Every 8 hours 12/13/22 1820 12/14/22 1322   12/13/22 1830  vancomycin (VANCOCIN) 2,250 mg in sodium chloride 0.9 % 500 mL IVPB        2,250 mg 261.3 mL/hr over  120 Minutes Intravenous  Once 12/13/22 1820 12/14/22 0115        Subjective: Patient seen and examined at bedside.  Denies any chest pain, nausea, vomiting or worsening shortness of breath.  Objective: Vitals:   12/16/22 0400 12/16/22 0450 12/16/22 0600 12/16/22 0754  BP:  (!) 147/80  (!) 166/105  Pulse: 91 91  64  Resp: '12 19 13 15  '$ Temp:  97.7 F (36.5 C)  97.7 F (36.5 C)  TempSrc:  Oral  Oral  SpO2:  95%  99%  Weight:      Height:        Intake/Output Summary (Last 24 hours) at 12/16/2022 1039 Last data filed at 12/16/2022 0600 Gross per 24 hour  Intake 680 ml  Output --  Net 680 ml   Filed Weights   12/12/22 1306  Weight: 103 kg    Examination:  General exam: Appears calm and comfortable.  Looks chronically ill and deconditioned.  Currently on room air.  Undergoing LTM EEG Respiratory system: Bilateral decreased breath sounds at bases with some scattered crackles Cardiovascular system: S1 & S2 heard, Rate controlled Gastrointestinal system: Abdomen is obese, nondistended, soft and nontender. Normal bowel sounds heard. Extremities: No cyanosis, clubbing; right BKA present  Central nervous system: Alert and oriented.  Slow to respond but answers questions appropriately.  No focal neurological deficits. Moving extremities Skin: No rashes, lesions or ulcers Psychiatry: Flat affect; not agitated  Data Reviewed: I have personally reviewed following labs and imaging studies  CBC: Recent Labs  Lab 12/12/22 1356 12/12/22 1419 12/14/22 0354 12/15/22 0403 12/16/22 0500  WBC 6.6  --  6.5 6.5 7.6  NEUTROABS 4.1  --   --  3.9 4.9  HGB 10.4* 13.3 10.4* 11.3* 10.5*  HCT 34.2* 39.0 33.0* 34.4* 32.5*  MCV 97.7  --  96.2 92.0 93.7  PLT 133*  --  130* 131* 811   Basic Metabolic Panel: Recent Labs  Lab 12/12/22 1356 12/12/22 1419 12/14/22 0354 12/14/22 1923 12/15/22 0403 12/16/22 0500  NA 137 137 140 134*  --  133*  K 3.9 4.3 4.2 4.2  --  4.7  CL 98 101 99  96*  --  96*  CO2 27  --  23 25  --  23  GLUCOSE 187* 191* 145* 98  --  99  BUN 39* 40* 52* 24*  --  47*  CREATININE 10.31* 10.90* 12.83* 7.24*  --  10.84*  CALCIUM 9.5  --  8.9 8.8*  --  9.1  MG  --   --   --   --  2.0 2.3  PHOS  --   --  7.1* 4.8*  --  7.7*   GFR: Estimated Creatinine Clearance: 7.7 mL/min (A) (by C-G formula based on SCr of 10.84 mg/dL (H)). Liver Function Tests: Recent Labs  Lab 12/12/22 1356 12/14/22 0354 12/14/22 1923 12/16/22 0500  AST 24  --   --   --   ALT 61*  --   --   --   ALKPHOS 108  --   --   --   BILITOT 0.4  --   --   --   PROT 7.9  --   --   --  ALBUMIN 3.4* 3.1* 3.1* 3.2*   No results for input(s): "LIPASE", "AMYLASE" in the last 168 hours. Recent Labs  Lab 12/12/22 1356  AMMONIA 24   Coagulation Profile: No results for input(s): "INR", "PROTIME" in the last 168 hours. Cardiac Enzymes: No results for input(s): "CKTOTAL", "CKMB", "CKMBINDEX", "TROPONINI" in the last 168 hours. BNP (last 3 results) No results for input(s): "PROBNP" in the last 8760 hours. HbA1C: Recent Labs    12/13/22 2055  HGBA1C 7.9*   CBG: Recent Labs  Lab 12/15/22 0631 12/15/22 1159 12/15/22 1637 12/15/22 2152 12/16/22 0625  GLUCAP 96 88 153* 172* 105*   Lipid Profile: Recent Labs    12/13/22 2055  CHOL 212*  HDL 41  LDLCALC 135*  TRIG 178*  CHOLHDL 5.2   Thyroid Function Tests: Recent Labs    12/13/22 2055  TSH 1.630   Anemia Panel: Recent Labs    12/13/22 2055  VITAMINB12 449   Sepsis Labs: No results for input(s): "PROCALCITON", "LATICACIDVEN" in the last 168 hours.  Recent Results (from the past 240 hour(s))  Resp panel by RT-PCR (RSV, Flu A&B, Covid) Anterior Nasal Swab     Status: None   Collection Time: 12/12/22  1:57 PM   Specimen: Anterior Nasal Swab  Result Value Ref Range Status   SARS Coronavirus 2 by RT PCR NEGATIVE NEGATIVE Final    Comment: (NOTE) SARS-CoV-2 target nucleic acids are NOT DETECTED.  The  SARS-CoV-2 RNA is generally detectable in upper respiratory specimens during the acute phase of infection. The lowest concentration of SARS-CoV-2 viral copies this assay can detect is 138 copies/mL. A negative result does not preclude SARS-Cov-2 infection and should not be used as the sole basis for treatment or other patient management decisions. A negative result may occur with  improper specimen collection/handling, submission of specimen other than nasopharyngeal swab, presence of viral mutation(s) within the areas targeted by this assay, and inadequate number of viral copies(<138 copies/mL). A negative result must be combined with clinical observations, patient history, and epidemiological information. The expected result is Negative.  Fact Sheet for Patients:  EntrepreneurPulse.com.au  Fact Sheet for Healthcare Providers:  IncredibleEmployment.be  This test is no t yet approved or cleared by the [place] FDA and  has been authorized for detection and/or diagnosis of SARS-CoV-2 by FDA under an Emergency Use Authorization (EUA). This EUA will remain  in effect (meaning this test can be used) for the duration of the COVID-19 declaration under Section 564(b)(1) of the Act, 21 U.S.C.section 360bbb-3(b)(1), unless the authorization is terminated  or revoked sooner.       Influenza A by PCR NEGATIVE NEGATIVE Final   Influenza B by PCR NEGATIVE NEGATIVE Final    Comment: (NOTE) The Xpert Xpress SARS-CoV-2/FLU/RSV plus assay is intended as an aid in the diagnosis of influenza from Nasopharyngeal swab specimens and should not be used as a sole basis for treatment. Nasal washings and aspirates are unacceptable for Xpert Xpress SARS-CoV-2/FLU/RSV testing.  Fact Sheet for Patients: EntrepreneurPulse.com.au  Fact Sheet for Healthcare Providers: IncredibleEmployment.be  This test is not yet approved or  cleared by the [place] FDA and has been authorized for detection and/or diagnosis of SARS-CoV-2 by FDA under an Emergency Use Authorization (EUA). This EUA will remain in effect (meaning this test can be used) for the duration of the COVID-19 declaration under Section 564(b)(1) of the Act, 21 U.S.C. section 360bbb-3(b)(1), unless the authorization is terminated or revoked.     Resp Syncytial Virus  by PCR NEGATIVE NEGATIVE Final    Comment: (NOTE) Fact Sheet for Patients: EntrepreneurPulse.com.au  Fact Sheet for Healthcare Providers: IncredibleEmployment.be  This test is not yet approved or cleared by the [place] FDA and has been authorized for detection and/or diagnosis of SARS-CoV-2 by FDA under an Emergency Use Authorization (EUA). This EUA will remain in effect (meaning this test can be used) for the duration of the COVID-19 declaration under Section 564(b)(1) of the Act, 21 U.S.C. section 360bbb-3(b)(1), unless the authorization is terminated or revoked.  Performed at [facility], [place] [address], [place], [place] [address]   Culture, blood (Routine X 2) w Reflex to ID Panel     Status: None (Preliminary result)   Collection Time: 12/13/22  8:55 PM   Specimen: BLOOD RIGHT ARM  Result Value Ref Range Status   Specimen Description BLOOD RIGHT ARM  Final   Special Requests   Final    BOTTLES DRAWN AEROBIC ONLY Blood Culture results may not be optimal due to an inadequate volume of blood received in culture bottles   Culture   Final    NO GROWTH 3 DAYS Performed at [facility], [place] [address] [place], [place] [address]    Report Status PENDING  Incomplete  Culture, blood (Routine X 2) w Reflex to ID Panel     Status: None (Preliminary result)   Collection Time: 12/13/22  9:15 PM   Specimen: BLOOD LEFT ARM  Result Value Ref Range Status   Specimen Description BLOOD LEFT ARM  Final   Special Requests   Final     BOTTLES DRAWN AEROBIC AND ANAEROBIC Blood Culture results may not be optimal due to an inadequate volume of blood received in culture bottles   Culture   Final    NO GROWTH 3 DAYS Performed at [facility], [place] [address], [place], [place] [address]    Report Status PENDING  Incomplete  Culture, blood (Routine X 2) w Reflex to ID Panel     Status: None (Preliminary result)   Collection Time: 12/14/22  3:55 AM   Specimen: BLOOD LEFT HAND  Result Value Ref Range Status   Specimen Description BLOOD LEFT HAND  Final   Special Requests   Final    BOTTLES DRAWN AEROBIC ONLY Blood Culture results may not be optimal due to an inadequate volume of blood received in culture bottles   Culture   Final    NO GROWTH 2 DAYS Performed at [facility], [place] [address], [place], [place] [address]    Report Status PENDING  Incomplete  Culture, blood (Routine X 2) w Reflex to ID Panel     Status: None (Preliminary result)   Collection Time: 12/14/22  4:08 AM   Specimen: BLOOD RIGHT HAND  Result Value Ref Range Status   Specimen Description BLOOD RIGHT HAND  Final   Special Requests   Final    BOTTLES DRAWN AEROBIC ONLY Blood Culture results may not be optimal due to an inadequate volume of blood received in culture bottles   Culture   Final    NO GROWTH 2 DAYS Performed at [facility], [place] [address] [place], [place] [address]    Report Status PENDING  Incomplete         Radiology Studies: Overnight EEG with video  Result Date: 12/14/2022 Lora Havens, MD     12/15/2022 10:26 AM Patient Name: RODNEY WIGGER MRN: [id number] Epilepsy  Attending: Lora Havens Referring Physician/Provider: Barb Merino, MD Duration: 12/13/2022 1531 to 12/14/2022 2446, 12/14/2022 1241 to 2030  Patient history: 51 year old female with altered mental status.  EEG to evaluate for seizure.  Level of alertness: Awake  AEDs during EEG study: None  Technical aspects: This EEG study was done  with scalp electrodes positioned according to the 10-20 International system of electrode placement. Electrical activity was reviewed with band pass filter of 1-'70Hz'$ , sensitivity of 7 uV/mm, display speed of 78m/sec with a '60Hz'$  notched filter applied as appropriate. EEG data were recorded continuously and digitally stored.  Video monitoring was available and reviewed as appropriate.  Description: No clear posterior dominant rhythm was seen. EEG showed continuous generalized 3-5 Hz theta-delta slowing. Generalized periodic discharges with triphasic morphology were also noted at 1 to 2 Hz, more prominent when awake/stimulated. Hyperventilation and photic stimulation were not performed.   EEG was disconnected between 12/14/2022 0729 to 1241 for dialysis.  ABNORMALITY - Periodic discharges with triphasic morphology, generalized ( GPDs) - Continuous slow, generalized  IMPRESSION: This study showed generalized periodic discharges with triphasic morphology likely due to toxic -metabolic causes. Additionally there is moderate diffuse encephalopathy, nonspecific etiology. No seizures were seen throughout the recording.  Priyanka OBarbra Sarks      Scheduled Meds:  aspirin  81 mg Oral Daily   Chlorhexidine Gluconate Cloth  6 each Topical Q0600   clopidogrel  75 mg Oral Daily   heparin  5,000 Units Subcutaneous Q8H   insulin aspart  0-15 Units Subcutaneous TID PC & HS   sodium chloride flush  10-40 mL Intracatheter Q12H   Continuous Infusions:  ceFEPime (MAXIPIME) IV Stopped (12/16/22 0537)   vancomycin            KAline August MD [facility] 12/16/2022, 10:39 AM

## 2022-12-16 NOTE — Care Management Important Message (Signed)
Important Message  Patient Details  Name: Heather [name] MRN: [id number] Date of Birth: [birth year]   Medicare Important Message Given:  Yes     Heather [name] 12/16/2022, 3:38 PM

## 2022-12-16 NOTE — Progress Notes (Signed)
[facility] Infectious Disease  Date of Admission:  12/12/2022           Reason for visit: Follow up on culture negative endocarditis  Current antibiotics: Cefepime Vancomycin   ASSESSMENT:    51 y.o. 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  She completed this 6-week course in mid November.  While on therapy, she developed CNS embolic infarcts noted on MRI in October 2023 and a follow-up MRI this admission shows new infarcts from prior.  TTE was completed on 12/18 which showed no significant change from her prior echo in October.  TEE is scheduled for today to get a better evaluation of her mitral valve.  Unclear at this time if this represents infective endocarditis as would expect her vegetation to resorb some following prolonged antibiotic therapy.  Additionally, her blood cultures have been negative the entire time and she has no infectious symptoms.  I think further evaluation for atypical infectious etiologies is warranted although felt to be less likely given her lack of risk factors.  Additionally, would recommend further workup for nonbacterial thrombotic endocarditis.  #ESRD on HD: She receives HD via a tunneled HD line.  She reports that her dialysis is secondary to poorly controlled diabetes and HTN.  She also reports a sister with a history of kidney disease who underwent kidney transplant in the past.  She thinks her sister was told that her kidney failure was due to something she inherited from her father, but she is not sure.  #Encephalopathy and new cerebral infarcts: Seen earlier by neurology this admission.  Her encephalopathy is much improved.  RECOMMENDATIONS:    Will check serologies for infectious and  non-infectious causes of endocarditis Continue antibiotics in the interim Await TEE and CVTS re-evaluation Ideally, would be able to obtain tissue to help with diagnosis Following   Principal Problem:   Stroke [facility]) Active Problems:   Diabetes mellitus, type II, insulin dependent ([place]), on CGM, with end organ damage: renal, neuropathy, gastroparesis, retinopathy   Hypertension associated with diabetes ([place])   Anemia in chronic kidney disease, on chronic dialysis ([place])   ESRD on hemodialysis ([place])   S/P BKA (below knee amputation), right ([facility])   PVD (peripheral vascular disease) ([place])   Acute metabolic encephalopathy   Encephalopathy   Cardiac valve mass   Goals of care, counseling/discussion    MEDICATIONS:    Scheduled Meds:  aspirin  81 mg Oral Daily   Chlorhexidine Gluconate Cloth  6 each Topical Q0600   clopidogrel  75 mg Oral Daily   heparin  5,000 Units Subcutaneous Q8H   insulin aspart  0-15 Units Subcutaneous TID PC & HS   sodium chloride flush  10-40 mL Intracatheter Q12H   Continuous Infusions:  ceFEPime (MAXIPIME) IV Stopped (12/16/22 0537)   vancomycin     PRN Meds:.acetaminophen **OR** acetaminophen (TYLENOL) oral liquid 160 mg/5 mL **OR** acetaminophen, labetalol, sodium chloride flush  SUBJECTIVE:   24 hour events:  [name] much more awake and alert this morning.  She reports some residual confusion, however, she is now talkative and following commands which is a drastic improvement from 2 days ago.  She remains afebrile.  [name] on room air.  Her WBC is normal.  Her blood cultures x  4 are no growth.  She has a TEE scheduled for 2 PM this afternoon.  She reports in the month interval that she was off antibiotics that she had no fevers or increased fatigue.  She had no other infectious symptoms other than some intermittent confusion.  She said that she felt dizzy during her time on antibiotics.   Review of Systems  All other systems reviewed and are  negative.     OBJECTIVE:   Blood pressure (!) 166/105, pulse 64, temperature 97.7 F (36.5 C), temperature source Oral, resp. rate 15, height '5\' 9"'$  (1.753 m), weight 103 kg, last menstrual period 09/10/2021, SpO2 99 %. Body mass index is 33.52 kg/m.  Physical Exam Constitutional:      Appearance: Normal appearance.  HENT:     Head: Normocephalic and atraumatic.  Eyes:     Extraocular Movements: Extraocular movements intact.     Conjunctiva/sclera: Conjunctivae normal.  Cardiovascular:     Rate and Rhythm: Normal rate and regular rhythm.  Pulmonary:     Effort: Pulmonary effort is normal. No respiratory distress.  Abdominal:     General: There is no distension.     Palpations: Abdomen is soft.  Musculoskeletal:     Comments: Status post right BKA  Skin:    General: Skin is warm and dry.  Neurological:     General: No focal deficit present.     Mental Status: [name] alert and oriented to person, place, and time.  Psychiatric:        Mood and Affect: Mood normal.        Behavior: Behavior normal.      Lab Results: Lab Results  Component Value Date   WBC 7.6 12/16/2022   HGB 10.5 (L) 12/16/2022   HCT 32.5 (L) 12/16/2022   MCV 93.7 12/16/2022   PLT 150 12/16/2022    Lab Results  Component Value Date   NA 133 (L) 12/16/2022   K 4.7 12/16/2022   CO2 23 12/16/2022   GLUCOSE 99 12/16/2022   BUN 47 (H) 12/16/2022   CREATININE 10.84 (H) 12/16/2022   CALCIUM 9.1 12/16/2022   GFRNONAA 4 (L) 12/16/2022   GFRAA 4 (L) 05/07/2020    Lab Results  Component Value Date   ALT 61 (H) 12/12/2022   AST 24 12/12/2022   ALKPHOS 108 12/12/2022   BILITOT 0.4 12/12/2022       Component Value Date/Time   CRP 6.8 (H) 09/30/2022 2023       Component Value Date/Time   ESRSEDRATE 52 (H) 09/30/2022 2023     I have reviewed the micro and lab results in Epic.  Imaging: Overnight EEG with video  Result Date: 12/14/2022 Lora Havens, MD     12/15/2022 10:26 AM  Patient Name: Heather [name] MRN: [id number] Epilepsy Attending: Lora Havens Referring Physician/Provider: Barb Merino, MD Duration: 12/13/2022 1531 to 12/14/2022 2229, 12/14/2022 1241 to 2030  Patient history: 51 year old female with altered mental status.  EEG to evaluate for seizure.  Level of alertness: Awake  AEDs during EEG study: None  Technical aspects: This EEG study was done with scalp electrodes positioned according to the 10-20 International system of electrode placement. Electrical activity was reviewed with band pass filter of 1-'70Hz'$ , sensitivity of 7 uV/mm, display speed of 63m/sec with a '60Hz'$  notched filter applied as appropriate. EEG data were recorded continuously and digitally stored.  Video monitoring was available and reviewed as appropriate.  Description: No clear posterior dominant rhythm was  seen. EEG showed continuous generalized 3-5 Hz theta-delta slowing. Generalized periodic discharges with triphasic morphology were also noted at 1 to 2 Hz, more prominent when awake/stimulated. Hyperventilation and photic stimulation were not performed.   EEG was disconnected between 12/14/2022 0729 to 1241 for dialysis.  ABNORMALITY - Periodic discharges with triphasic morphology, generalized ( GPDs) - Continuous slow, generalized  IMPRESSION: This study showed generalized periodic discharges with triphasic morphology likely due to toxic -metabolic causes. Additionally there is moderate diffuse encephalopathy, nonspecific etiology. No seizures were seen throughout the recording.  Lora Havens    Imaging independently reviewed in Epic.    [facility] [phone] pager 12/16/2022, 9:02 AM  I have personally spent 50 minutes involved in face-to-face and non-face-to-face activities for this patient on the day of the visit. Professional time spent includes the following activities: Preparing to see the patient  (review of tests), Obtaining and/or reviewing separately obtained history (admission/discharge record), Performing a medically appropriate examination and/or evaluation , Ordering medications/tests/procedures, referring and communicating with other health care professionals, Documenting clinical information in the EMR, Independently interpreting results (not separately reported), Communicating results to the patient/family/caregiver, Counseling and educating the patient/family/caregiver and Care coordination (not separately reported).

## 2022-12-16 NOTE — Plan of Care (Signed)
  Problem: Education: Goal: Knowledge of disease or condition will improve Outcome: Progressing Goal: Knowledge of secondary prevention will improve (MUST DOCUMENT ALL) Outcome: Progressing Goal: Knowledge of patient specific risk factors will improve (Mark N/A or DELETE if not current risk factor) Outcome: Progressing   Problem: Ischemic Stroke/TIA Tissue Perfusion: Goal: Complications of ischemic stroke/TIA will be minimized Outcome: Progressing   Problem: Coping: Goal: Will verbalize positive feelings about self Outcome: Progressing Goal: Will identify appropriate support needs Outcome: Progressing   Problem: Health Behavior/Discharge Planning: Goal: Ability to manage health-related needs will improve Outcome: Progressing Goal: Goals will be collaboratively established with patient/family Outcome: Progressing   Problem: Self-Care: Goal: Ability to participate in self-care as condition permits will improve Outcome: Progressing Goal: Verbalization of feelings and concerns over difficulty with self-care will improve Outcome: Progressing Goal: Ability to communicate needs accurately will improve Outcome: Progressing   Problem: Nutrition: Goal: Risk of aspiration will decrease Outcome: Progressing Goal: Dietary intake will improve Outcome: Progressing   Problem: Education: Goal: Ability to describe self-care measures that may prevent or decrease complications (Diabetes Survival Skills Education) will improve Outcome: Progressing Goal: Individualized Educational Video(s) Outcome: Progressing   Problem: Coping: Goal: Ability to adjust to condition or change in health will improve Outcome: Progressing   Problem: Fluid Volume: Goal: Ability to maintain a balanced intake and output will improve Outcome: Progressing   Problem: Health Behavior/Discharge Planning: Goal: Ability to identify and utilize available resources and services will improve Outcome:  Progressing Goal: Ability to manage health-related needs will improve Outcome: Progressing   Problem: Metabolic: Goal: Ability to maintain appropriate glucose levels will improve Outcome: Progressing   Problem: Nutritional: Goal: Maintenance of adequate nutrition will improve Outcome: Progressing Goal: Progress toward achieving an optimal weight will improve Outcome: Progressing   Problem: Skin Integrity: Goal: Risk for impaired skin integrity will decrease Outcome: Progressing   Problem: Tissue Perfusion: Goal: Adequacy of tissue perfusion will improve Outcome: Progressing   Problem: Education: Goal: Knowledge of General Education information will improve Description: Including pain rating scale, medication(s)/side effects and non-pharmacologic comfort measures Outcome: Progressing   Problem: Health Behavior/Discharge Planning: Goal: Ability to manage health-related needs will improve Outcome: Progressing   Problem: Clinical Measurements: Goal: Ability to maintain clinical measurements within normal limits will improve Outcome: Progressing Goal: Will remain free from infection Outcome: Progressing Goal: Diagnostic test results will improve Outcome: Progressing Goal: Respiratory complications will improve Outcome: Progressing Goal: Cardiovascular complication will be avoided Outcome: Progressing   Problem: Activity: Goal: Risk for activity intolerance will decrease Outcome: Progressing   Problem: Nutrition: Goal: Adequate nutrition will be maintained Outcome: Progressing   Problem: Coping: Goal: Level of anxiety will decrease Outcome: Progressing   Problem: Elimination: Goal: Will not experience complications related to bowel motility Outcome: Progressing Goal: Will not experience complications related to urinary retention Outcome: Progressing   Problem: Pain Managment: Goal: General experience of comfort will improve Outcome: Progressing   Problem:  Safety: Goal: Ability to remain free from injury will improve Outcome: Progressing   Problem: Skin Integrity: Goal: Risk for impaired skin integrity will decrease Outcome: Progressing   

## 2022-12-16 NOTE — Progress Notes (Signed)
LTM EEG discontinued - no skin breakdown at unhook.   

## 2022-12-16 NOTE — CV Procedure (Signed)
     TRANSESOPHAGEAL ECHOCARDIOGRAM   NAME:  Heather [name]   MRN: [id number] DOB:  07/03/1971   ADMIT DATE: 12/12/2022  INDICATIONS: CVA  PROCEDURE:   Informed consent was obtained prior to the procedure. The risks, benefits and alternatives for the procedure were discussed and the patient comprehended these risks.  Risks include, but are not limited to, cough, sore throat, vomiting, nausea, somnolence, esophageal and stomach trauma or perforation, bleeding, low blood pressure, aspiration, pneumonia, infection, trauma to the teeth and death.    After a procedural time-out, the oropharynx was anesthetized and the patient was sedated by the anesthesia service. The transesophageal probe was inserted in the esophagus and stomach without difficulty and multiple views were obtained. Anesthesia was monitored by Rejeana Brock, CRNA   COMPLICATIONS:    There were no immediate complications.  FINDINGS: Long thin mobile mass attached to posterior mitral valve annulus, similar in appearance to prior TEE 09/2022   Oswaldo Milian MD [facility]  [address] [place], [place] [address] [phone]   4:27 PM

## 2022-12-16 NOTE — Anesthesia Postprocedure Evaluation (Signed)
Anesthesia Post Note  Patient: Heather [name]  Procedure(s) Performed: TRANSESOPHAGEAL ECHOCARDIOGRAM (TEE) BUBBLE STUDY     Patient location during evaluation: PACU Anesthesia Type: MAC Level of consciousness: awake and alert Pain management: pain level controlled Vital Signs Assessment: post-procedure vital signs reviewed and stable Respiratory status: spontaneous breathing, nonlabored ventilation and respiratory function stable Cardiovascular status: stable and blood pressure returned to baseline Anesthetic complications: no   No notable events documented.  Last Vitals:  Vitals:   12/16/22 1505 12/16/22 1510  BP: 131/73   Pulse: 87 88  Resp: 13 14  Temp:    SpO2: 100% 98%    Last Pain:  Vitals:   12/16/22 1510  TempSrc:   PainSc: 0-No pain                 Audry Pili

## 2022-12-16 NOTE — Progress Notes (Signed)
Report given to [facility] and pt transported to HD via bed by HD staff. Telemetry informed. Care transferred.

## 2022-12-16 NOTE — Progress Notes (Signed)
  Echocardiogram Echocardiogram Transesophageal has been performed.  Heather [name] 12/16/2022, 2:50 PM

## 2022-12-16 NOTE — Interval H&P Note (Signed)
History and Physical Interval Note:  12/16/2022 2:09 PM  Heather [name]  has presented today for surgery, with the diagnosis of STROKE.  The various methods of treatment have been discussed with the patient and family. After consideration of risks, benefits and other options for treatment, the patient has consented to  Procedure(s): TRANSESOPHAGEAL ECHOCARDIOGRAM (TEE) (N/A) as a surgical intervention.  The patient's history has been reviewed, patient examined, no change in status, stable for surgery.  I have reviewed the patient's chart and labs.  Questions were answered to the patient's satisfaction.     Donato Heinz

## 2022-12-16 NOTE — Progress Notes (Signed)
Consultation Note Date: 12/16/2022   Patient Name: Heather [name]  DOB: 1971-03-28  MRN: [id number]  Age / Sex: 51 y.o., female  PCP: [name], MD Referring Physician: Aline August, MD  Reason for Consultation: Goals of Care  Extensive chart review has been completed prior to meeting patient including labs, vital signs, imaging, progress notes, orders, and available advanced directive documents from current and previous encounters.    HPI: 51 y.o. female  with past medical history of long-standing insulin dependent T2DM, ESRD on HD, cardiac arrest, CVA, PE/DVT on Eliquis, s/p right BKA. 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 Admitted on 12/12/2022 with stroke-like symptoms. Husband reports finding Heather [name] sitting in her wheelchair at their front door confused and weak early morning on day of admission.  Neurology consulted. MRI reveals acute ischemic right occipital infarcts, possibly embolic. EEG being completed 12/16/2022.  Echocardiogram concerning for mass attached to posterior mitral valve--TEE ordered for today 12/16/2022. Completed extended course of antibiotics last month for endocarditis. Cardiology, ID and CT surgery consulted.  Nephrology following to resume HD.  Patient Profile:  Met with Heather [name] as well as Heather [name] (husband) at bedside. Heather Maffucci (son) and daughter in law on telephone call while in room, placed on speaker phone by [name] to allow them to be a part of conversation. Heather [name] lives at home with her husband. 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 Fairly independent, utilizes wheelchair due to right BKA, able to self-transfer. Explains she was diagnosed with T2DM when she was 51 years old and has had many health concerns/events in the last couple of years. Reports that she started HD about 4-5  years ago. Explains she had a cardiac arrest while undergoing dialysis port placement, required mechanical ventilation. Right BKA about 2 years ago.  Has a close knit family and extended family.  Primary Decision Maker PATIENT Expresses interest in completing HCPOA papers and will plans to delegate, Juanda Crumble (husband) as Economist.  Discussion: Introduced myself as a Designer, jewellery as a member of the palliative care team. Explained palliative medicine is specialized medical care for people living with serious illness. It focuses on providing relief from the symptoms and stress of a serious illness. The goal is to improve quality of life for both the patient and the family  Discussed her diagnosis of acute CVA and ongoing work-up. Discussed recent completion of antibiotic course for endocarditis and further concern for possible recurrence of endocarditis as seen on echocardiogram.  Ms. Tandy and Juanda Crumble (husband) explain they were starting to have conversations regarding goals of care and wanting to have advanced directives completed. Introduced the topic of Full Code versus DNR with explanations of each options. Heather [name] explains she wishes to pursuit full scope of care and treatment at this time. Desires to remain a Full Code status. Emphasized importance of ongoing conversations with family and support persons regarding goals of care and wishes.  Initiated conversations regarding multiple comorbid conditions and increased risk of morbidity/mortality associated with multiple chronic conditions and disease trajectories associated. Explained underlying long-standing T2DM likely cause of multiple medical conditions. Explained ESRD as a result of complete organ failure with associated risk of further organ damage/failure. Will need ongoing conversations and continued education regarding these topics in regards to increased risk of mortality associated with multiple comorbid conditions.  Awaiting  results of all testing to be completed and reported.  PT  has briefly worked with patient and Heather [name] reports she continues to have weakness and at that time was unable to self-transfer. Understands the medical team is recommending short term rehab placement. Heather [name] explains she is not excited about this but seems open to exploring this option.  At this time, Heather [name] denies acute pain, remains comfortable. NPO due to scheduled TEE but looking forward to having a meal when able.  I discussed importance of continued conversations with family/support persons and all members of their medical team regarding overall plan of care and treatment options ensuring decisions are in alignment with patients goals of care.  All questions/concerns addressed. Emotional support provided to patient/family/support persons. PMT will continue to follow and support patient as needed.  SUMMARY OF RECOMMENDATIONS   Full Code Expresses interest in completing HCPOA documentation-referral placed Ongoing education/conversations regarding multiple comorbid conditions with increased risk of mortality and desires for [place] conversations.  Code Status/Advance Care Planning: Full code  Palliative Prophylaxis:   Bowel Regimen, Delirium Protocol and Frequent Pain Assessment  Spiritual:  Desire for ongoing Chaplain support: No Education provided on Chaplain services offered through PMT, education provided on Grief/Bereavement support services   Discharge Planning: TBD.  Primary Diagnoses: Present on Admission:  Stroke [facility])  Hypertension associated with diabetes ([place])  Vital Signs: BP (!) 161/91   Pulse 92   Temp (!) 97.5 F (36.4 C) (Tympanic)   Resp 12   Ht _0  (1.753 m)   Wt 108.9 kg   LMP 09/10/2021   SpO2 96%   BMI 35.44 kg/m  Pain Scale: 0-10   LBM: Last BM Date : 12/15/22 Baseline Weight: Weight: 103 kg Most recent weight: Weight: 108.9 kg       Thank you for this consult. Palliative  medicine will continue to follow and assist as needed.  Time Total: 90 minutes Greater than 50%  of this time was spent counseling and coordinating care related to the above assessment and plan.  Signed by: Theodoro Grist, DNP, AGNP-C Palliative Medicine    Please contact Palliative Medicine Team phone at [phone] for questions and concerns.  For individual provider: See Shea Evans

## 2022-12-16 NOTE — TOC Progression Note (Signed)
Transition of Care [facility]) - Progression Note    Patient Details  Name: Heather [name] MRN: [id number] Date of Birth: 07/12/1971  Transition of [facility]) CM/SW Contact  Jinger Neighbors, [place] Phone Number: 12/16/2022, 1:12 PM  Clinical Narrative:     CSW reviewed bed offers and attempted to contact pt's spouse via phone; no answer.   Planned Disposition: Skilled Nursing Facility Barriers to Discharge: SNF Pending bed offer  Expected Discharge Plan and Services                                               Social Determinants of Health (SDOH) Interventions SDOH Screenings   Food Insecurity: No Food Insecurity (12/15/2022)  Housing: Low Risk  (12/15/2022)  Transportation Needs: No Transportation Needs (12/15/2022)  Utilities: Not At Risk (12/15/2022)  Depression (PHQ2-9): Low Risk  (10/26/2022)  Financial Resource Strain: Low Risk  (10/26/2022)  Physical Activity: Inactive (10/26/2022)  Social Connections: Moderately Integrated (10/26/2022)  Stress: No Stress Concern Present (10/26/2022)  Tobacco Use: Low Risk  (12/12/2022)    Readmission Risk Interventions     No data to display

## 2022-12-16 NOTE — Anesthesia Procedure Notes (Signed)
Procedure Name: MAC Date/Time: 12/16/2022 2:15 PM  Performed by: Inda Coke, CRNAPre-anesthesia Checklist: Patient identified, Emergency Drugs available, Suction available, Timeout performed and Patient being monitored Patient Re-evaluated:Patient Re-evaluated prior to induction Oxygen Delivery Method: Nasal cannula Induction Type: IV induction Dental Injury: Teeth and Oropharynx as per pre-operative assessment

## 2022-12-16 NOTE — Progress Notes (Signed)
Patient ID: Heather [name], female   DOB: 10/01/1971, 51 y.o.   MRN: [id number] S: No new complaints.  Seen in room with husband at bedside and has TEE planned for today.  O:BP (!) 166/105 (BP Location: Right Arm)   Pulse 64   Temp 97.7 F (36.5 C) (Oral)   Resp 15   Ht '5\' 9"'$  (1.753 m)   Wt 103 kg   LMP 09/10/2021   SpO2 99%   BMI 33.52 kg/m   Intake/Output Summary (Last 24 hours) at 12/16/2022 1106 Last data filed at 12/16/2022 0600 Gross per 24 hour  Intake 680 ml  Output --  Net 680 ml   Intake/Output: I/O last 3 completed shifts: In: 692 [P.O.:480; I.V.:12; IV Piggyback:200] Out: -   Intake/Output this shift:  No intake/output data recorded. Weight change:  Gen: NAD CVS: RRR Resp:CTA Abd: +BS, soft, NT/ND Ext: no edema  Recent Labs  Lab 12/12/22 1356 12/12/22 1419 12/14/22 0354 12/14/22 1923 12/16/22 0500  NA 137 137 140 134* 133*  K 3.9 4.3 4.2 4.2 4.7  CL 98 101 99 96* 96*  CO2 27  --  '23 25 23  '$ GLUCOSE 187* 191* 145* 98 99  BUN 39* 40* 52* 24* 47*  CREATININE 10.31* 10.90* 12.83* 7.24* 10.84*  ALBUMIN 3.4*  --  3.1* 3.1* 3.2*  CALCIUM 9.5  --  8.9 8.8* 9.1  PHOS  --   --  7.1* 4.8* 7.7*  AST 24  --   --   --   --   ALT 61*  --   --   --   --    Liver Function Tests: Recent Labs  Lab 12/12/22 1356 12/14/22 0354 12/14/22 1923 12/16/22 0500  AST 24  --   --   --   ALT 61*  --   --   --   ALKPHOS 108  --   --   --   BILITOT 0.4  --   --   --   PROT 7.9  --   --   --   ALBUMIN 3.4* 3.1* 3.1* 3.2*   No results for input(s): "LIPASE", "AMYLASE" in the last 168 hours. Recent Labs  Lab 12/12/22 1356  AMMONIA 24   CBC: Recent Labs  Lab 12/12/22 1356 12/12/22 1419 12/14/22 0354 12/15/22 0403 12/16/22 0500  WBC 6.6  --  6.5 6.5 7.6  NEUTROABS 4.1  --   --  3.9 4.9  HGB 10.4*   < > 10.4* 11.3* 10.5*  HCT 34.2*   < > 33.0* 34.4* 32.5*  MCV 97.7  --  96.2 92.0 93.7  PLT 133*  --  130* 131* 150   < > = values in this interval not  displayed.   Cardiac Enzymes: No results for input(s): "CKTOTAL", "CKMB", "CKMBINDEX", "TROPONINI" in the last 168 hours. CBG: Recent Labs  Lab 12/15/22 0631 12/15/22 1159 12/15/22 1637 12/15/22 2152 12/16/22 0625  GLUCAP 96 88 153* 172* 105*    Iron Studies: No results for input(s): "IRON", "TIBC", "TRANSFERRIN", "FERRITIN" in the last 72 hours. Studies/Results: Overnight EEG with video  Result Date: 12/14/2022 Lora Havens, MD     12/15/2022 10:26 AM Patient Name: Heather [name] MRN: [id number] Epilepsy Attending: Lora Havens Referring Physician/Provider: Barb Merino, MD Duration: 12/13/2022 1531 to 12/14/2022 1696, 12/14/2022 1241 to 2030  Patient history: 51 year old female with altered mental status.  EEG to evaluate for seizure.  Level of alertness: Awake  AEDs during EEG  study: None  Technical aspects: This EEG study was done with scalp electrodes positioned according to the 10-20 International system of electrode placement. Electrical activity was reviewed with band pass filter of 1-'70Hz'$ , sensitivity of 7 uV/mm, display speed of 50m/sec with a '60Hz'$  notched filter applied as appropriate. EEG data were recorded continuously and digitally stored.  Video monitoring was available and reviewed as appropriate.  Description: No clear posterior dominant rhythm was seen. EEG showed continuous generalized 3-5 Hz theta-delta slowing. Generalized periodic discharges with triphasic morphology were also noted at 1 to 2 Hz, more prominent when awake/stimulated. Hyperventilation and photic stimulation were not performed.   EEG was disconnected between 12/14/2022 0729 to 1241 for dialysis.  ABNORMALITY - Periodic discharges with triphasic morphology, generalized ( GPDs) - Continuous slow, generalized  IMPRESSION: This study showed generalized periodic discharges with triphasic morphology likely due to toxic -metabolic causes. Additionally there is moderate diffuse encephalopathy,  nonspecific etiology. No seizures were seen throughout the recording.  Priyanka OBarbra Sarks  aspirin  81 mg Oral Daily   atorvastatin  80 mg Oral QHS   Chlorhexidine Gluconate Cloth  6 each Topical Q0600   clopidogrel  75 mg Oral Daily   heparin  5,000 Units Subcutaneous Q8H   insulin aspart  0-15 Units Subcutaneous TID PC & HS   sodium chloride flush  10-40 mL Intracatheter Q12H    BMET    Component Value Date/Time   NA 133 (L) 12/16/2022 0500   NA 133 (A) 03/20/2019 0000   K 4.7 12/16/2022 0500   K 4.3 10/16/2013 0000   CL 96 (L) 12/16/2022 0500   CL 100 10/16/2013 0000   CO2 23 12/16/2022 0500   GLUCOSE 99 12/16/2022 0500   BUN 47 (H) 12/16/2022 0500   BUN 41 (A) 03/20/2019 0000   CREATININE 10.84 (H) 12/16/2022 0500   CREATININE 3.05 (H) 09/03/2015 0855   CALCIUM 9.1 12/16/2022 0500   CALCIUM 8.9 10/16/2013 0000   GFRNONAA 4 (L) 12/16/2022 0500   GFRNONAA 18 (L) 09/03/2015 0855   GFRAA 4 (L) 05/07/2020 0819   GFRAA 20 (L) 09/03/2015 0855   CBC    Component Value Date/Time   WBC 7.6 12/16/2022 0500   RBC 3.47 (L) 12/16/2022 0500   HGB 10.5 (L) 12/16/2022 0500   HCT 32.5 (L) 12/16/2022 0500   PLT 150 12/16/2022 0500   MCV 93.7 12/16/2022 0500   MCH 30.3 12/16/2022 0500   MCHC 32.3 12/16/2022 0500   RDW 15.3 12/16/2022 0500   LYMPHSABS 1.5 12/16/2022 0500   MONOABS 0.8 12/16/2022 0500   EOSABS 0.3 12/16/2022 0500   BASOSABS 0.0 12/16/2022 0500    Dialysis Orders:  [facility] . 180NRe 3 hr 45 min BFR 400 DFR 700 EDW 102.3kg 2K 2 Ca TDC Heparin 7000 unit bolus Venoefr '50mg'$  weekly Gectorol 471m IV q HD     Assessment/Plan:  New embolic stroke: neurology following. Recent endocarditis. HD center did have to use ceftaz at one point due to cefepime shortage but appears she otherwise completed her IV abx. Repeat echo with mass on posterior mitral valve. Cardiology consulted for TEE, as well as ID and CT surgery.   Acute encephalopathy: new embolic stroke  on MRI but also took baclofen which is contraindicated in ESRD. Stop baclofen and continue with HD.  Thankfully she has woken up and is speaking.  ESRD:  On MWF schedule, will plan for HD today then get back on schedule Sunday (holiday  schedule for MWF patients)   Hypertension/volume: Bp significantly elevated on arrival. Neurology recommended normotensive BP goal. UF with HD as tolerated today.   Anemia: Hgb above goal, no ESA indicated at this time.   Metabolic bone disease: Continue binders once tolerating PO (calcium acetate 3 caps per meal). Continue VDRA  Nutrition:  Currently NPO T2DM: management per primary team  Donetta Potts, MD [facility]

## 2022-12-16 NOTE — Transfer of Care (Signed)
Immediate Anesthesia Transfer of Care Note  Patient: Heather [name]  Procedure(s) Performed: TRANSESOPHAGEAL ECHOCARDIOGRAM (TEE) BUBBLE STUDY  Patient Location: Endoscopy Unit  Anesthesia Type:MAC  Level of Consciousness: drowsy  Airway & Oxygen Therapy: Patient Spontanous Breathing and Patient connected to nasal cannula oxygen  Post-op Assessment: Report given to RN and Post -op Vital signs reviewed and stable  Post vital signs: Reviewed and stable  Last Vitals:  Vitals Value Taken Time  BP    Temp    Pulse    Resp    SpO2      Last Pain:  Vitals:   12/16/22 1321  TempSrc: Tympanic  PainSc: 5          Complications: No notable events documented.

## 2022-12-16 NOTE — Procedures (Addendum)
Patient Name: Heather [name]  MRN: [id number]  Epilepsy Attending: Lora Havens  Referring Physician/Provider: Barb Merino, MD  Duration: 12/15/2022 2030 to 12/16/2022 1813   Patient history: 52 year old female with altered mental status.  EEG to evaluate for seizure.   Level of alertness: Awake, asleep   AEDs during EEG study: None   Technical aspects: This EEG study was done with scalp electrodes positioned according to the 10-20 International system of electrode placement. Electrical activity was reviewed with band pass filter of 1-'70Hz'$ , sensitivity of 7 uV/mm, display speed of 79m/sec with a '60Hz'$  notched filter applied as appropriate. EEG data were recorded continuously and digitally stored.  Video monitoring was available and reviewed as appropriate.   Description: At the beginning of the study, no clear posterior dominant rhythm was seen. EEG showed continuous generalized 3-5 Hz theta-delta slowing.  Gradually EEG improved and showed posterior dominant rhythm of 8 Hz activity of moderate voltage (25-35 uV) seen predominantly in posterior head regions, symmetric and reactive to eye opening and eye closing. Sleep was characterized by vertex waves, sleep spindles (12 to 14 Hz), maximal frontocentral region. Intermittent generalized 2 to 3 Hz delta slowing was noted, with triphasic morphology. Hyperventilation and photic stimulation were not performed.     Of note, study was disconnected between 12/16/2022 1308 to 1543 for a procedure.    ABNORMALITY -Intermittent slow, generalized   IMPRESSION: This study was suggestive of mild to moderate diffuse encephalopathy, nonspecific etiology. No seizures were seen throughout the recording.  EEG appears to be improving compared to previous day.   Darvell Monteforte OBarbra Sarks

## 2022-12-17 ENCOUNTER — Inpatient Hospital Stay (HOSPITAL_COMMUNITY): Payer: Medicare Other

## 2022-12-17 DIAGNOSIS — Z992 Dependence on renal dialysis: Secondary | ICD-10-CM

## 2022-12-17 DIAGNOSIS — Z515 Encounter for palliative care: Secondary | ICD-10-CM

## 2022-12-17 DIAGNOSIS — G9340 Encephalopathy, unspecified: Secondary | ICD-10-CM | POA: Diagnosis not present

## 2022-12-17 DIAGNOSIS — I5189 Other ill-defined heart diseases: Secondary | ICD-10-CM | POA: Diagnosis not present

## 2022-12-17 DIAGNOSIS — N186 End stage renal disease: Secondary | ICD-10-CM

## 2022-12-17 DIAGNOSIS — I639 Cerebral infarction, unspecified: Secondary | ICD-10-CM | POA: Diagnosis not present

## 2022-12-17 DIAGNOSIS — Z89511 Acquired absence of right leg below knee: Secondary | ICD-10-CM | POA: Diagnosis not present

## 2022-12-17 DIAGNOSIS — I340 Nonrheumatic mitral (valve) insufficiency: Secondary | ICD-10-CM | POA: Diagnosis not present

## 2022-12-17 DIAGNOSIS — G9341 Metabolic encephalopathy: Secondary | ICD-10-CM | POA: Diagnosis not present

## 2022-12-17 LAB — RENAL FUNCTION PANEL
Albumin: 3.1 g/dL — ABNORMAL LOW (ref 3.5–5.0)
Anion gap: 17 — ABNORMAL HIGH (ref 5–15)
BUN: 30 mg/dL — ABNORMAL HIGH (ref 6–20)
CO2: 26 mmol/L (ref 22–32)
Calcium: 9.6 mg/dL (ref 8.9–10.3)
Chloride: 92 mmol/L — ABNORMAL LOW (ref 98–111)
Creatinine, Ser: 8.09 mg/dL — ABNORMAL HIGH (ref 0.44–1.00)
GFR, Estimated: 5 mL/min — ABNORMAL LOW (ref >60)
Glucose, Bld: 107 mg/dL — ABNORMAL HIGH (ref 70–99)
Phosphorus: 5 mg/dL — ABNORMAL HIGH (ref 2.5–4.6)
Potassium: 3.9 mmol/L (ref 3.5–5.1)
Sodium: 135 mmol/L (ref 135–145)

## 2022-12-17 LAB — GLUCOSE, CAPILLARY
Glucose-Capillary: 104 mg/dL — ABNORMAL HIGH (ref 70–99)
Glucose-Capillary: 105 mg/dL — ABNORMAL HIGH (ref 70–99)
Glucose-Capillary: 105 mg/dL — ABNORMAL HIGH (ref 70–99)
Glucose-Capillary: 118 mg/dL — ABNORMAL HIGH (ref 70–99)
Glucose-Capillary: 132 mg/dL — ABNORMAL HIGH (ref 70–99)
Glucose-Capillary: 135 mg/dL — ABNORMAL HIGH (ref 70–99)
Glucose-Capillary: 141 mg/dL — ABNORMAL HIGH (ref 70–99)

## 2022-12-17 LAB — CBC WITH DIFFERENTIAL/PLATELET
Abs Immature Granulocytes: 0.02 10*3/uL (ref 0.00–0.07)
Basophils Absolute: 0 10*3/uL (ref 0.0–0.1)
Basophils Relative: 0 %
Eosinophils Absolute: 0.4 10*3/uL (ref 0.0–0.5)
Eosinophils Relative: 5 %
HCT: 31.2 % — ABNORMAL LOW (ref 36.0–46.0)
Hemoglobin: 10.2 g/dL — ABNORMAL LOW (ref 12.0–15.0)
Immature Granulocytes: 0 %
Lymphocytes Relative: 18 %
Lymphs Abs: 1.4 10*3/uL (ref 0.7–4.0)
MCH: 30.5 pg (ref 26.0–34.0)
MCHC: 32.7 g/dL (ref 30.0–36.0)
MCV: 93.4 fL (ref 80.0–100.0)
Monocytes Absolute: 0.7 10*3/uL (ref 0.1–1.0)
Monocytes Relative: 9 %
Neutro Abs: 5.4 10*3/uL (ref 1.7–7.7)
Neutrophils Relative %: 68 %
Platelets: 158 10*3/uL (ref 150–400)
RBC: 3.34 MIL/uL — ABNORMAL LOW (ref 3.87–5.11)
RDW: 15.1 % (ref 11.5–15.5)
WBC: 8 10*3/uL (ref 4.0–10.5)
nRBC: 0 % (ref 0.0–0.2)

## 2022-12-17 LAB — MAGNESIUM: Magnesium: 2.1 mg/dL (ref 1.7–2.4)

## 2022-12-17 LAB — VANCOMYCIN, RANDOM: Vancomycin Rm: 31 ug/mL

## 2022-12-17 MED ORDER — CALCIUM ACETATE (PHOS BINDER) 667 MG PO CAPS
667.0000 mg | ORAL_CAPSULE | Freq: Three times a day (TID) | ORAL | Status: DC
  Administered 2022-12-17 – 2022-12-21 (×13): 667 mg via ORAL
  Filled 2022-12-17 (×13): qty 1

## 2022-12-17 MED ORDER — VANCOMYCIN HCL 500 MG/100ML IV SOLN
500.0000 mg | Freq: Once | INTRAVENOUS | Status: DC
  Filled 2022-12-17: qty 100

## 2022-12-17 MED ORDER — HYDROCORTISONE 1 % EX OINT
TOPICAL_OINTMENT | Freq: Two times a day (BID) | CUTANEOUS | Status: DC | PRN
  Filled 2022-12-17: qty 28

## 2022-12-17 NOTE — Progress Notes (Signed)
Patient ID: Heather [name], female   DOB: 03/16/1971, 51 y.o.   MRN: [id number]    Progress Note from the Palliative Medicine Team at [facility]   Patient Name: Heather [name]        Date: 12/17/2022 DOB: [birth year]  Age: 51 y.o. MRN#: [id number] Attending Physician: Aline August, MD Primary Care Physician: [name], MD Admit Date: 12/12/2022   Medical records reviewed, assessed patient, husband at bedsdie   51 y.o. female  with past medical history of long-standing insulin dependent T2DM, ESRD on HD, cardiac arrest, CVA, PE/DVT on Eliquis, s/p right BKA. 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 Admitted on 12/12/2022 with stroke-like symptoms. Husband reports finding Heather [name] sitting in her wheelchair at their front door confused and weak early morning on day of admission.   Neurology consulted. MRI reveals acute ischemic right occipital infarcts, possibly embolic. EEG being completed 12/16/2022.   Echocardiogram concerning for mass attached to posterior mitral valve--TEE ordered for today 12/16/2022. Completed extended course of antibiotics last month for endocarditis. Cardiology, ID and CT surgery consulted.   Nephrology following to resume HD.  -EEG negative for seizures.  LTM EEG negative for seizures.    Mental status had improved significantly over the last couple of days and patient was tolerating diet as per SLP.  However, last night, she was found to be lethargic during hemodialysis, probably after being given Benadryl.    Repeat CT head did not show any acute new intracranial abnormality.    Mental status subsequently improved slightly.  Patient c/o double vision in right eye with dowawrd gaze. Patient/husband requesting neurology evaluation.      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  Cardiothoracic surgery consultation is pending.  Follow recommendations.  -Currently on cefepime and vancomycin as per ID recommendations   End-stage renal disease on hemodialysis -Nephrology following.  Dialysis as per nephrology schedule   History of PVD status post right BKA -Currently wheelchair-bound   Anemia of chronic disease -From renal failure.  Hemoglobin stable.  Monitor intermittently.    Diabetes mellitus type 2 with hyperglycemia -A1c 7.9.  Continue CBGs with SSI      This NP assessed patient at the bedside as a follow up to  yesterday's [place].  Husband at bedside.  Patient is lethargic but oriented X3.  [name] reporting double vision in right eye on downward gaze.  [name] tearful today and verbalizes sadness.  Request for neurology follow-up   Continued education regarding complex, serious medical situation.   It seems both patient and her husband are now realizing the actuality of her chronic, life limiting illnesses.  Patient and family remain hopeful for improvement and are open to all life prolonging measures.  I expressed my worry for her high risk for decompensation and increasing anticipatory care needs.   Education offered today regarding  the importance of continued conversation with each other, family  and their  medical providers regarding overall plan of care and treatment options,  ensuring decisions are within the context of the patients values and GOCs.  Questions and concerns addressed   Discussed with Dr Starla Link, and treatment team with any immediate needs.  PMT will f/u next  week --please call team phone   75 minutes   Wadie Lessen NP  Palliative Medicine Team Team Phone # [phone] Pager [phone]

## 2022-12-17 NOTE — Progress Notes (Signed)
Pharmacy Antibiotic Note  Heather [name] is a 51 y.o. 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  Pharmacy has been consulted for Vancomycin + Cefepime dosing due to concern for persistent culture negative IE.   [facility] however received HD off-schedule yesterday. Per nursing documentation, pt was only able to receive 2 hours of HD with a documented BFR of 400. Pt became less responsive at end of HD session and rapid response was called. Vancomycin not documented as given, but per nursing documentation was handed off to primary RN. Vancomycin random level ordered 12/22 and returned at 31, indicating likely received a dose of vancomycin that was not charted, but unable to confirm for sure. Primary day Rn also unsure if vancomycin was administered.   Noted next HD tentatively planned 12/24 per holiday schedule.   Plan: - Follow up HD schedule- likely 12/24  - Cefepime 1g IV every 24 hours for now - Will continue to follow HD schedule/duration, culture results, LOT, and antibiotic de-escalation plans  Height: '5\' 9"'$  (175.3 cm) Weight: 98.8 kg (217 lb 13 oz) (bed scale) IBW/kg (Calculated) : 66.2  Temp (24hrs), Avg:98 F (36.7 C), Min:97.5 F (36.4 C), Max:98.5 F (36.9 C)  Recent Labs  Lab 12/12/22 1356 12/12/22 1419 12/14/22 0354 12/14/22 1923 12/15/22 0403 12/16/22 0500 12/17/22 0642  WBC 6.6  --  6.5  --  6.5 7.6 8.0  CREATININE 10.31* 10.90* 12.83* 7.24*  --  10.84* 8.09*     Estimated Creatinine Clearance: 10.1 mL/min (A) (by C-G formula based on SCr of 8.09 mg/dL (H)).    Allergies  Allergen Reactions   Ibuprofen Other (See Comments)    CKD stage 3. Should avoid.   Dilaudid [Hydromorphone Hcl] Itching and Other (See Comments)    Can take with Benadryl.   Iodinated Contrast Media Rash   Tramadol Itching    Antimicrobials this admission: Vanc 12/18 >> Zosyn 12/19 x 1 Cefepime 12/19 >>  Dose adjustments this admission: N/a  Microbiology results: 12/17  COVID/flu >> neg 12/18 BCx >> ng<12h 12/19 BCx >>  Thank you for allowing pharmacy to be a part of this patient's care.  Adria Dill, PharmD PGY-2 Infectious Diseases Resident  12/17/2022 11:52 AM

## 2022-12-17 NOTE — Consult Note (Signed)
[address]       [place] [address]             [phone]                    Heather [name] [place] Medical Record #[id number] Date of Birth: 06/21/1971  Referring: No ref. provider found Primary Care: Heather Barrack, MD Primary Cardiologist: Heather Mayo, MD  Chief Complaint:    Chief Complaint  Patient presents with   Altered Mental Status    History of Present Illness:    Heather [name] 51 y.o. female admitted with altered mental status.  She has a history of end-stage renal disease and on hemodialysis via permacatheter.  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  CTS has been consulted to assist with management.    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[place]), nonhemorrhagic, inferior right cerebellum 12/03/2017   left side weakness in leg   Cyst of left ovary 01/12/2018   Diabetic neuropathy, painful ([place]), on low dose Gabapentin 07/13/2013   DM (diabetes mellitus), type 2, uncontrolled, with renal complications 65/02/5464   DM2 (diabetes mellitus, type 2) ([place])    Dysfunction of left eustachian tube, with pusatile tinnitus 11/24/2017   Dyslipidemia associated with type 2 diabetes mellitus ([place]) 06/25/2016   ESRD (end stage renal disease) on dialysis [facility])    On hemodialysis in July 2019 via [facility] then switched to [facility] in Nov 2019.    ESRD with anemia (HCC)    Fibromyalgia    Gastroparesis due to DM    GERD (gastroesophageal reflux disease)    Headache    Hypertension associated with diabetes ([place]) 02/19/2011   IBS (irritable bowel syndrome)    Left-sided weakness 12/10/2017   .   Leiomyoma of uterus    Pancreatitis    PE (pulmonary thromboembolism) ([place])  11/04/2019   Pneumonia    Proliferative diabetic retinopathy ([place]) 09/29/2015   Pronation deformity of both feet 06/14/2014   Reactive depression 12/10/2017   .   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   Thromboembolism ([place]) 04/05/2018   Upper airway cough syndrome, with recs to stay off ACE and take Pepcid q hs 11/15/2016   Urticaria     Past Surgical History:  Procedure Laterality Date   A/V FISTULAGRAM Right 03/03/2020   Procedure: Venogram;  Surgeon: Waynetta Sandy, MD;  Location: [place] [facility];  Service: Cardiovascular;  Laterality: Right;   ARTERY REPAIR Right 02/17/2018   Procedure: EXPLORATION OF RIGHT BRACHIAL ARTERY;  Surgeon: Conrad Salinas, MD;  Location: [place];  Service: Vascular;  Laterality: Right;   ARTERY REPAIR Right 02/17/2018   Procedure: BRACHIAL ARTERY EXPLORATION AND TRHOMBECTOMY;  Surgeon: Conrad Pickens, MD;  Location: [place];  Service: Vascular;  Laterality: Right;   AV FISTULA PLACEMENT Left 05/09/2017   Procedure: INSERTION OF ARTERIOVENOUS (AV) GRAFT ARM (ARTEGRAFT);  Surgeon: Conrad Avoca, MD;  Location: [facility] OR;  Service: Vascular;  Laterality: Left;   AV FISTULA PLACEMENT Right 02/17/2018   Procedure: INSERTION OF ARTERIOVENOUS (AV) GORE-TEX GRAFT ARM RIGHT  UPPER ARM;  Surgeon: Conrad [place], MD;  Location: [place];  Service: Vascular;  Laterality: Right;   AV FISTULA PLACEMENT Left 10/10/2019   Procedure: INSERTION OF LEFT ARTERIOVENOUS (AV) ARTEGRAFT GRAFT ARM;  Surgeon: Waynetta Sandy, MD;  Location: [place];  Service: Vascular;  Laterality: Left;   AV FISTULA PLACEMENT Right 03/19/2020   Procedure: INSERTION OF ARTERIOVENOUS (AV) GORE-TEX GRAFT IN RIGHT ARM AND VIABAHN STENT IN RIGHT AXILLARY ARTERY;  Surgeon: Waynetta Sandy, MD;  Location: [place];  Service: Vascular;  Laterality: Right;   [place]  Left 08/31/2016   Procedure: BASILIC VEIN TRANSPOSITION FIRST STAGE;  Surgeon: Conrad Dennis Port, MD;  Location: [place];  Service: Vascular;  Laterality: Left;   BRONCHIAL WASHINGS  09/17/2021   Procedure: BRONCHIAL WASHINGS;  Surgeon: Collene Gobble, MD;  Location: [facility];  Service: Cardiopulmonary;;   CENTRAL VENOUS CATHETER INSERTION Left 07/24/2018   Procedure: INSERTION CENTRAL LINE ADULT;  Surgeon: Conrad Arthur, MD;  Location: [place];  Service: Vascular;  Laterality: Left;   EYE SURGERY     secondary to diabetic retinopathy    FOOT SURGERY Right    t"ook bone out- maybe hammer toe"   INSERTION OF DIALYSIS CATHETER Left 07/24/2018   Procedure: INSERTION OF TUNNELED DIALYSIS CATHETER;  Surgeon: Conrad Bristol, MD;  Location: [place];  Service: Vascular;  Laterality: Left;   IR FLUORO GUIDE CV LINE LEFT  12/19/2021   IR FLUORO GUIDE CV LINE LEFT  12/29/2021   IR FLUORO GUIDE CV LINE RIGHT  07/21/2018   IR FLUORO GUIDE CV LINE RIGHT  06/01/2019   IR FLUORO GUIDE CV LINE RIGHT  05/05/2020   IR FLUORO GUIDE CV LINE RIGHT  10/04/2022   IR REMOVAL TUN CV CATH W/O FL  01/23/2021   IR REMOVAL TUN CV CATH W/O FL  12/19/2021   IR REMOVAL TUN CV CATH W/O FL  10/01/2022   IR US GUIDE VASC ACCESS LEFT  12/19/2021   IR US GUIDE VASC ACCESS RIGHT  07/21/2018   IR US GUIDE VASC ACCESS RIGHT  06/01/2019   IR US GUIDE VASC ACCESS RIGHT  10/04/2022   LIGATION ARTERIOVENOUS GORTEX GRAFT Right 03/20/2020   Procedure: LIGATION ARM ARTERIOVENOUS GORTEX GRAFT With Patch Angioplasty, Thrombectomy;  Surgeon: Waynetta Sandy, MD;  Location: [place];  Service: Vascular;  Laterality: Right;   LIGATION OF ARTERIOVENOUS  FISTULA Left 05/09/2017   Procedure: LIGATION OF ARTERIOVENOUS  FISTULA;  Surgeon: Conrad Alba, MD;  Location: [place];  Service: Vascular;  Laterality: Left;   LOOP RECORDER INSERTION N/A 12/05/2017   Procedure: LOOP RECORDER INSERTION;  Surgeon: Evans Lance, MD;  Location: [place] [facility];  Service:  Cardiovascular;  Laterality: N/A;   MYOMECTOMY     REMOVAL OF GRAFT Right 02/17/2018   Procedure: REMOVAL OF RIGHT UPPER ARM ARTERIOVENOUS GRAFT;  Surgeon: Conrad Elmore, MD;  Location: [place];  Service: Vascular;  Laterality: Right;   REVISION OF ARTERIOVENOUS GORETEX GRAFT Left 07/24/2018   Procedure: REDO ARTERIOVENOUS GORETEX GRAFT;  Surgeon: Conrad [place]ville, MD;  Location: [place];  Service: Vascular;  Laterality: Left;   TEE WITHOUT CARDIOVERSION N/A 12/05/2017   Procedure: TRANSESOPHAGEAL ECHOCARDIOGRAM (TEE);  Surgeon: Sanda Klein, MD;  Location: [place];  Service: Cardiovascular;  Laterality: N/A;   TEE WITHOUT CARDIOVERSION N/A 09/30/2022   Procedure: TRANSESOPHAGEAL ECHOCARDIOGRAM (TEE);  Surgeon: Jerline Pain, MD;  Location: [facility] ENDOSCOPY;  Service: Cardiovascular;  Laterality:  N/A;   UPPER EXTREMITY VENOGRAPHY Left 07/13/2018   Procedure: UPPER EXTREMITY VENOGRAPHY - Central & Left Arm;  Surgeon: Conrad [place], MD;  Location: [place] [facility];  Service: Cardiovascular;  Laterality: Left;   UPPER EXTREMITY VENOGRAPHY Bilateral 09/10/2019   Procedure: UPPER EXTREMITY VENOGRAPHY;  Surgeon: Waynetta Sandy, MD;  Location: [place] [facility];  Service: Cardiovascular;  Laterality: Bilateral;   UTERINE FIBROID SURGERY     VIDEO BRONCHOSCOPY N/A 09/17/2021   Procedure: VIDEO BRONCHOSCOPY WITHOUT FLUORO;  Surgeon: Collene Gobble, MD;  Location: [facility];  Service: Cardiopulmonary;  Laterality: N/A;   VITRECTOMY Bilateral     Family History  Problem Relation Age of Onset   Colon polyps Mother    Diabetes Mother    Heart murmur Father        history essentially unknown   Hypertension Father    Diabetes Sister    Kidney failure Sister        kidney transplant   Diabetes Brother    Retinal degeneration Brother    Heart murmur Son    Stroke Paternal Grandmother    Diabetes Sister      Social History   Tobacco Use  Smoking Status Never   Passive exposure:  Never  Smokeless Tobacco Never    Social History   Substance and Sexual Activity  Alcohol Use No   Alcohol/week: 0.0 standard drinks of alcohol     Allergies  Allergen Reactions   Ibuprofen Other (See Comments)    CKD stage 3. Should avoid.   Dilaudid [Hydromorphone Hcl] Itching and Other (See Comments)    Can take with Benadryl.   Iodinated Contrast Media Rash   Tramadol Itching    Current Facility-Administered Medications  Medication Dose Route Frequency Provider Last Rate Last Admin   acetaminophen (TYLENOL) tablet 650 mg  650 mg Oral Q4H PRN Tu, Ching T, DO       Or   acetaminophen (TYLENOL) 160 MG/5ML solution 650 mg  650 mg Per Tube Q4H PRN Tu, Ching T, DO       Or   acetaminophen (TYLENOL) suppository 650 mg  650 mg Rectal Q4H PRN Tu, Ching T, DO       aspirin chewable tablet 81 mg  81 mg Oral Daily Leonie Man, Pramod S, MD   81 mg at 12/17/22 0804   atorvastatin (LIPITOR) tablet 80 mg  80 mg Oral QHS Alekh, Kshitiz, MD   80 mg at 12/16/22 2112   calcium acetate (PHOSLO) capsule 667 mg  667 mg Oral TID WC Donato Heinz, MD   667 mg at 12/17/22 1300   ceFEPIme (MAXIPIME) 1 g in sodium chloride 0.9 % 100 mL IVPB  1 g Intravenous Q24H Mignon Pine, DO   Stopped at 12/16/22 2030   Chlorhexidine Gluconate Cloth 2 % PADS 6 each  6 each Topical Q0600 Dwyane Dee, MD   6 each at 12/17/22 1300   clopidogrel (PLAVIX) tablet 75 mg  75 mg Oral Daily Dwyane Dee, MD   75 mg at 12/17/22 0804   heparin injection 5,000 Units  5,000 Units Subcutaneous Q8H Tu, Ching T, DO   5,000 Units at 12/17/22 1306   hydrocortisone 1 % ointment   Topical BID PRN Aline August, MD       insulin aspart (novoLOG) injection 0-15 Units  0-15 Units Subcutaneous TID PC & HS Tu, Ching T, DO   2 Units at 12/17/22 1300   labetalol (NORMODYNE) injection 10  mg  10 mg Intravenous Q2H PRN Tu, Ching T, DO   10 mg at 12/14/22 0418   sodium chloride flush (NS) 0.9 % injection 10-40 mL  10-40 mL  Intracatheter Q12H Barb Merino, MD   10 mL at 12/17/22 1219   sodium chloride flush (NS) 0.9 % injection 10-40 mL  10-40 mL Intracatheter PRN Barb Merino, MD        Review of Systems  Respiratory:  Negative for cough and shortness of breath.   Cardiovascular:  Negative for chest pain.  Neurological:  Positive for dizziness and sensory change.    PHYSICAL EXAMINATION: BP (!) 111/54 (BP Location: Right Arm)   Pulse 92   Temp 98 F (36.7 C) (Oral)   Resp 15   Ht '5\' 9"'$  (1.753 m)   Wt 98.8 kg Comment: bed scale  LMP 09/10/2021   SpO2 98%   BMI 32.17 kg/m   Physical Exam Constitutional:      General: [name] not in acute distress.    Appearance: [name] not ill-appearing.  Cardiovascular:     Rate and Rhythm: Normal rate.  Pulmonary:     Effort: Pulmonary effort is normal. No respiratory distress.  Abdominal:     General: There is no distension.  Neurological:     General: No focal deficit present.     Mental Status: [name] alert and oriented to person, place, and time.      Diagnostic Studies & Laboratory data:     Recent Radiology Findings:   CT HEAD WO CONTRAST (5MM)  Result Date: 12/17/2022 CLINICAL DATA:  Follow-up examination for acute stroke. EXAM: CT HEAD WITHOUT CONTRAST TECHNIQUE: Contiguous axial images were obtained from the base of the skull through the vertex without intravenous contrast. RADIATION DOSE REDUCTION: This exam was performed according to the departmental dose-optimization program which includes automated exposure control, adjustment of the mA and/or kV according to patient size and/or use of iterative reconstruction technique. COMPARISON:  Prior brain MRI from 12/12/2022 as well as earlier studies. FINDINGS: Brain: Cerebral volume within normal limits. Underlying moderate chronic microvascular ischemic disease noted, stable. Chronic bilateral cerebellar infarcts noted, unchanged. Continue normal expected interval evolution of small subacute  infarct involving the parasagittal left frontoparietal region. Previously identified small right occipital infarcts not visible. No other new visible acute large vessel territory infarct. No acute intracranial hemorrhage. No mass lesion or midline shift. No hydrocephalus or extra-axial fluid collection. Vascular: No abnormal hyperdense vessel. Calcified atherosclerosis present about the skull base. Skull: Scalp soft tissues and calvarium demonstrate no acute finding. Sinuses/Orbits: Globes and orbital soft tissues within normal limits. Paranasal sinuses and mastoid air cells are largely clear. Other: None. IMPRESSION: 1. Normal expected interval evolution of small subacute infarct involving the parasagittal left frontoparietal region. No other new acute intracranial abnormality. 2. Underlying moderate chronic microvascular ischemic disease with chronic bilateral cerebellar infarcts, stable. Electronically Signed   By: Jeannine Boga M.D.   On: 12/17/2022 01:21   ECHO TEE  Result Date: 12/16/2022    TRANSESOPHOGEAL ECHO REPORT   Patient Name:   ORIAH LEINWEBER Date of Exam: 12/16/2022 Medical Rec #:  [id number]            Height:       69.0 in Accession #:    [id number]           Weight:       240.0 lb Date of Birth:  Dec 10, 1971  BSA:          2.232 m Patient Age:    33 years             BP:           200/86 mmHg Patient Gender: F                    HR:           101 bpm. Exam Location:  Inpatient Procedure: 3D Echo, Transesophageal Echo, Cardiac Doppler and Color Doppler Indications:     Mitral valve mass.  History:         Patient has prior history of Echocardiogram examinations, most                  recent 12/13/2022. Abnormal ECG, Stroke, Mitral Valve Disease,                  Arrythmias:Tachycardia and Cardiac Arrest,                  Signs/Symptoms:Dyspnea and Shortness of Breath; Risk                  Factors:Diabetes. ESRD.  Sonographer:     Roseanna Rainbow RDCS Referring Phys:  [id number]  [place] Diagnosing Phys: Oswaldo Milian MD PROCEDURE: After discussion of the risks and benefits of a TEE, an informed consent was obtained from the patient. The transesophogeal probe was passed without difficulty through the esophogus of the patient. Imaged were obtained with the patient in a left lateral decubitus position. Sedation performed by different physician. The patient was monitored while under deep sedation. Anesthestetic sedation was provided intravenously by Anesthesiology: '227mg'$  of Propofol. The patient's vital signs; including heart rate, blood pressure, and oxygen saturation; remained stable throughout the procedure. The patient developed no complications during the procedure.  IMPRESSIONS  1. 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 Very mobile, prolapses across mitral valve in diastole  2. Left ventricular ejection fraction, by estimation, is 60 to 65%. The left ventricle has normal function.  3. Right ventricular systolic function is normal. The right ventricular size is normal.  4. The mitral valve is degenerative. Mild mitral valve regurgitation. Moderate mitral annular calcification.  5. The aortic valve is tricuspid. Aortic valve regurgitation is not visualized. Aortic valve sclerosis/calcification is present, without any evidence of aortic stenosis.  6. No left atrial/left atrial appendage thrombus was detected.  7. Agitated saline contrast bubble study was positive with shunting observed within 3-6 cardiac cycles suggestive of interatrial shunt.  8. Moderate pericardial effusion. FINDINGS  Left Ventricle: Left ventricular ejection fraction, by estimation, is 60 to 65%. The left ventricle has normal function. The left ventricular internal cavity size was normal in size. Right Ventricle: The right ventricular size is normal. No increase in right ventricular wall thickness. Right  ventricular systolic function is normal. Left Atrium: Left atrial size was normal in size. No left atrial/left atrial appendage thrombus was detected. Right Atrium: Right atrial size was normal in size. Pericardium: A moderately sized pericardial effusion is present. Mitral Valve: The mitral valve is degenerative in appearance. Moderate mitral annular calcification. Mild mitral valve regurgitation. Tricuspid Valve: The tricuspid valve is normal in structure. Tricuspid valve regurgitation is trivial. Aortic Valve: The aortic valve is tricuspid. Aortic valve regurgitation is not visualized. Aortic valve sclerosis/calcification is present, without any evidence of aortic stenosis. Aortic  valve mean gradient measures 4.0 mmHg. Aortic valve peak gradient measures 9.1 mmHg. Pulmonic Valve: The pulmonic valve was not well visualized. Pulmonic valve regurgitation is trivial. Aorta: The aortic root is normal in size and structure. IAS/Shunts: No atrial level shunt detected by color flow Doppler. Agitated saline contrast was given intravenously to evaluate for intracardiac shunting. Agitated saline contrast bubble study was positive with shunting observed within 3-6 cardiac cycles suggestive of interatrial shunt. Additional Comments: Spectral Doppler performed. AORTIC VALVE AV Vmax:      151.00 cm/s AV Vmean:     95.400 cm/s AV VTI:       0.278 m AV Peak Grad: 9.1 mmHg AV Mean Grad: 4.0 mmHg Oswaldo Milian MD Electronically signed by Oswaldo Milian MD Signature Date/Time: 12/16/2022/4:26:48 PM    Final    Overnight EEG with video  Result Date: 12/14/2022 Lora Havens, MD     12/15/2022 10:26 AM Patient Name: MARYFRANCES PORTUGAL MRN: [id number] Epilepsy Attending: Lora Havens Referring Physician/Provider: Barb Merino, MD Duration: 12/13/2022 1531 to 12/14/2022 8756, 12/14/2022 1241 to 2030  Patient history: 51 year old female with altered mental status.  EEG to evaluate for seizure.  Level of  alertness: Awake  AEDs during EEG study: None  Technical aspects: This EEG study was done with scalp electrodes positioned according to the 10-20 International system of electrode placement. Electrical activity was reviewed with band pass filter of 1-'70Hz'$ , sensitivity of 7 uV/mm, display speed of 17m/sec with a '60Hz'$  notched filter applied as appropriate. EEG data were recorded continuously and digitally stored.  Video monitoring was available and reviewed as appropriate.  Description: No clear posterior dominant rhythm was seen. EEG showed continuous generalized 3-5 Hz theta-delta slowing. Generalized periodic discharges with triphasic morphology were also noted at 1 to 2 Hz, more prominent when awake/stimulated. Hyperventilation and photic stimulation were not performed.   EEG was disconnected between 12/14/2022 0729 to 1241 for dialysis.  ABNORMALITY - Periodic discharges with triphasic morphology, generalized ( GPDs) - Continuous slow, generalized  IMPRESSION: This study showed generalized periodic discharges with triphasic morphology likely due to toxic -metabolic causes. Additionally there is moderate diffuse encephalopathy, nonspecific etiology. No seizures were seen throughout the recording.  PLora Havens ECHOCARDIOGRAM COMPLETE  Result Date: 12/13/2022    ECHOCARDIOGRAM REPORT   Patient Name:   RMAYO OWCZARZAKDate of Exam: 12/13/2022 Medical Rec #:  [id number]           Height:       69.0 in Accession #:    [id number]          Weight:       227.0 lb Date of Birth:  1June 27, 1972          BSA:          2.180 m Patient Age:    89years             BP:           203/118 mmHg Patient Gender: F                    HR:           89 bpm. Exam Location:  Inpatient Procedure: 2D Echo, Color Doppler and Cardiac Doppler Indications:    Stroke i63.9  History:        Patient has prior history of Echocardiogram examinations, most  recent 09/30/2022. Risk Factors:Hypertension, Diabetes,                  Dyslipidemia and ESRD. History of MV Vegetation.  Sonographer:    Raquel Sarna Senior RDCS Referring Phys: [id number] [place] T TU  Sonographer Comments: Very difficult study due to body habitus and near constant patient movement. IMPRESSIONS  1. There is a mass attached to the posterior MV calcified annulus that measures 1.9 cm x 0.5 cm. This is highly mobile. This is similar to the prior echo as this was previously present. The mitral valve is degenerative. Trivial mitral valve regurgitation. Mild mitral stenosis. The mean mitral valve gradient is 5.0 mmHg with average heart rate of 87 bpm. Moderate to severe mitral annular calcification.  2. Left ventricular ejection fraction, by estimation, is 60 to 65%. The left ventricle has normal function. The left ventricle has no regional wall motion abnormalities. There is mild asymmetric left ventricular hypertrophy of the basal-septal segment. Left ventricular diastolic function could not be evaluated.  3. Right ventricular systolic function is normal. The right ventricular size is normal. Tricuspid regurgitation signal is inadequate for assessing PA pressure.  4. A small pericardial effusion is present. The pericardial effusion is circumferential.  5. The aortic valve is tricuspid. There is mild calcification of the aortic valve. There is mild thickening of the aortic valve. Aortic valve regurgitation is not visualized. Aortic valve sclerosis/calcification is present, without any evidence of aortic stenosis. Comparison(s): No significant change from prior study. FINDINGS  Left Ventricle: Left ventricular ejection fraction, by estimation, is 60 to 65%. The left ventricle has normal function. The left ventricle has no regional wall motion abnormalities. The left ventricular internal cavity size was normal in size. There is  mild asymmetric left ventricular hypertrophy of the basal-septal segment. Left ventricular diastolic function could not be evaluated due to mitral  annular calcification (moderate or greater). Left ventricular diastolic function could not be evaluated. Right Ventricle: The right ventricular size is normal. No increase in right ventricular wall thickness. Right ventricular systolic function is normal. Tricuspid regurgitation signal is inadequate for assessing PA pressure. Left Atrium: Left atrial size was normal in size. Right Atrium: Right atrial size was normal in size. Pericardium: A small pericardial effusion is present. The pericardial effusion is circumferential. Mitral Valve: There is a mass attached to the posterior MV calcified annulus that measures 1.9 cm x 0.5 cm. This is highly mobile. This is similar to the prior echo as this was previously present. The mitral valve is degenerative in appearance. Moderate to severe mitral annular calcification. Trivial mitral valve regurgitation. Mild mitral valve stenosis. MV peak gradient, 9.1 mmHg. The mean mitral valve gradient is 5.0 mmHg with average heart rate of 87 bpm. Tricuspid Valve: The tricuspid valve is grossly normal. Tricuspid valve regurgitation is not demonstrated. No evidence of tricuspid stenosis. Aortic Valve: The aortic valve is tricuspid. There is mild calcification of the aortic valve. There is mild thickening of the aortic valve. Aortic valve regurgitation is not visualized. Aortic valve sclerosis/calcification is present, without any evidence of aortic stenosis. Pulmonic Valve: The pulmonic valve was grossly normal. Pulmonic valve regurgitation is not visualized. No evidence of pulmonic stenosis. Aorta: The aortic root and ascending aorta are structurally normal, with no evidence of dilitation. Venous: The inferior vena cava was not well visualized. IAS/Shunts: The atrial septum is grossly normal.  LEFT VENTRICLE PLAX 2D LVIDd:         3.80 cm   Diastology LVIDs:  2.50 cm   LV e' medial:    3.70 cm/s LV PW:         1.00 cm   LV E/e' medial:  28.4 LV IVS:        1.40 cm   LV e'  lateral:   4.79 cm/s LVOT diam:     1.70 cm   LV E/e' lateral: 21.9 LV SV:         37 LV SV Index:   17 LVOT Area:     2.27 cm  RIGHT VENTRICLE RV S prime:     10.60 cm/s TAPSE (M-mode): 1.2 cm LEFT ATRIUM           Index        RIGHT ATRIUM           Index LA diam:      3.80 cm 1.74 cm/m   RA Area:     14.40 cm LA Vol (A4C): 48.8 ml 22.38 ml/m  RA Volume:   33.20 ml  15.23 ml/m  AORTIC VALVE LVOT Vmax:   90.50 cm/s LVOT Vmean:  60.900 cm/s LVOT VTI:    0.162 m  AORTA Ao Root diam: 2.80 cm Ao Asc diam:  3.20 cm MITRAL VALVE MV Area (PHT): 2.42 cm     SHUNTS MV Area VTI:   1.43 cm     Systemic VTI:  0.16 m MV Peak grad:  9.1 mmHg     Systemic Diam: 1.70 cm MV Mean grad:  5.0 mmHg MV Vmax:       1.51 m/s MV Vmean:      101.0 cm/s MV Decel Time: 313 msec MV E velocity: 105.00 cm/s MV A velocity: 138.00 cm/s MV E/A ratio:  0.76 Eleonore Chiquito MD Electronically signed by Eleonore Chiquito MD Signature Date/Time: 12/13/2022/12:07:02 PM    Final    EEG adult  Result Date: 12/13/2022 Lora Havens, MD     12/13/2022  9:32 AM Patient Name: KALASIA CRAFTON MRN: [id number] Epilepsy Attending: Lora Havens Referring Physician/Provider: Amie Portland, MD Date: 12/13/2022 Duration: 21.38 mins Patient history: 51 year old female with altered mental status.  EEG to evaluate for seizure. Level of alertness: Awake AEDs during EEG study: None Technical aspects: This EEG study was done with scalp electrodes positioned according to the 10-20 International system of electrode placement. Electrical activity was reviewed with band pass filter of 1-'70Hz'$ , sensitivity of 7 uV/mm, display speed of 74m/sec with a '60Hz'$  notched filter applied as appropriate. EEG data were recorded continuously and digitally stored.  Video monitoring was available and reviewed as appropriate. Description: No clear posterior dominant rhythm was seen.  EEG showed continuous generalized 3-5 Hz theta-delta slowing. Generalized periodic discharges  with triphasic morphology were also noted at 1 to 1.5 Hz.  Hyperventilation and photic stimulation were not performed.   ABNORMALITY - Periodic discharges with triphasic morphology, generalized ( GPDs) - Continuous slow, generalized IMPRESSION: This study showed generalized periodic discharges with triphasic morphology which can be on the ictal-interictal continuum.  However the frequency and morphology is more commonly indicative of toxic -metabolic causes. Additionally there is moderate diffuse encephalopathy, nonspecific etiology. No seizures were seen throughout the recording. PLora Havens  MR BRAIN WO CONTRAST  Result Date: 12/12/2022 CLINICAL DATA:  Altered mental status.  Aphasia. EXAM: MRI HEAD WITHOUT CONTRAST TECHNIQUE: Multiplanar, multiecho pulse sequences of the brain and surrounding structures were obtained without intravenous contrast. COMPARISON:  CT head without contrast 12/12/2022. MR head without contrast  10/21/2022. FINDINGS: Brain: 2 foci of restricted diffusion are present within the subcortical white matter of the right occipital pole. The larger measures 9 mm. T2 and FLAIR signal changes are associated with the infarcts. No other acute infarcts are present. Moderate generalized atrophy and advanced diffuse confluent periventricular and scattered subcortical T2 hyperintensities bilaterally are stable. A remote right cerebellar infarct is stable. A posterior left cerebellar infarct measures 9 mm. This is new since the prior MRI but not acute. Expected evolution of nonhemorrhagic infarct in the medial left frontal parietal cortex is noted. The ventricles are proportionate to the degree of atrophy. No significant extraaxial fluid collection is present. White matter changes extending into the brainstem are stable. Vascular: Flow is present in the major intracranial arteries. Skull and upper cervical spine: The craniocervical junction is normal. Upper cervical spine is within normal  limits. Marrow signal is unremarkable. Sinuses/Orbits: The paranasal sinuses and mastoid air cells are clear. Bilateral lens replacements are noted. Exophthalmos is stable. Globes and orbits are otherwise unremarkable. IMPRESSION: 1. 2 foci of restricted diffusion within the subcortical white matter of the right occipital pole consistent with acute/subacute nonhemorrhagic infarcts. 2. 9 mm posterior left cerebellar infarct is new since the prior MRI, but not acute 3. Expected evolution of nonhemorrhagic infarct in the medial left frontal parietal cortex. 4. Moderate generalized atrophy and advanced white matter disease likely reflects the sequela of chronic microvascular ischemia. These results were called by telephone at the time of interpretation on 12/12/2022 at 7:55 pm to provider [facility] , who verbally acknowledged these results. Electronically Signed   By: San Morelle M.D.   On: 12/12/2022 19:55   DG Chest Portable 1 View  Result Date: 12/12/2022 CLINICAL DATA:  Altered mental status. EXAM: PORTABLE CHEST 1 VIEW COMPARISON:  Chest radiograph dated October 21, 2022 FINDINGS: The heart is enlarged. Mild pulmonary vascular congestion. Right IJ access dialysis catheter with distal tip in the right atrium, unchanged. Right axillary vascular stent is also unchanged. Low lung volumes without evidence of focal consolidation or pleural effusion. Thoracic spondylosis. IMPRESSION: 1. Cardiomegaly with mild pulmonary vascular congestion. 2. Low lung volumes without evidence of focal consolidation or pleural effusion. Electronically Signed   By: Keane Police D.O.   On: 12/12/2022 15:14   CT Head Wo Contrast  Result Date: 12/12/2022 CLINICAL DATA:  Altered mental status. Nontraumatic. Patient went to bed last night around midnight normally, woke up this a.m. at 3 and was not able to talk EXAM: CT HEAD WITHOUT CONTRAST TECHNIQUE: Contiguous axial images were obtained from the base of the skull  through the vertex without intravenous contrast. RADIATION DOSE REDUCTION: This exam was performed according to the departmental dose-optimization program which includes automated exposure control, adjustment of the mA and/or kV according to patient size and/or use of iterative reconstruction technique. COMPARISON:  CT examination dated October 26, 223 FINDINGS: Brain: No evidence of acute infarction, hemorrhage, hydrocephalus, extra-axial collection or mass lesion/mass effect. Low attenuation in the left paramedian frontoparietal region suggesting recent infarct. 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 Diffuse low-attenuation of the periventricular white matter presumed chronic microvascular ischemic changes. Vascular: No hyperdense vessel or unexpected calcification. Skull: Normal. Negative for fracture or focal lesion. Sinuses/Orbits: No acute finding. Other: None. IMPRESSION: 1. No acute intracranial hemorrhage, mass effect or midline shift. MRI examination for further evaluation is suggested if clinically warranted. 2. Low attenuation in the left paramedian frontoparietal region suggesting recent infarct, similar to prior examination. 3.  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 4. Advanced chronic microvascular ischemic changes of the white matter, unchanged. Electronically Signed   By: Keane Police D.O.   On: 12/12/2022 14:18       I have independently reviewed the above radiology studies  and reviewed the findings with the patient.   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  We discussed the risks of surgical debridement, and possible MVR.  [name] extremely high risk given her comorbidities.  Additionally, she does not have significant valve disfunction on echo, however she does have a good deal of annular calcification.  She does not want to proceed with surgery given our discussion.  Continue medical management.      Lajuana Matte 12/17/2022 4:35 PM

## 2022-12-17 NOTE — Progress Notes (Addendum)
2352 Patient had a Change in mental status. Rapid Response was alerted. Blood sugar was checked 104. Rapid Response Nurse alerted Neuro doctor for Further Evaluation.0031 Patient was transported to [place] with the patient primary nurse.patient was alert to self. Does selective response to questions. Placed on transport monitor. Vancomycin was given to the primary nurse and sent with the patient. see rapid response nurse for further documentation.

## 2022-12-17 NOTE — Progress Notes (Signed)
Patient ID: Heather [name], female   DOB: August 15, 1971, 51 y.o.   MRN: [id number] S: Doesn't feel well this morning, but also completed HD around 1 am.  She became lethargic after po benadryl, CT scan unremarkable except expected evolution of small subacute infarct. O:BP (!) 198/92 (BP Location: Right Arm)   Pulse (!) 101   Temp 97.9 F (36.6 C) (Oral)   Resp 14   Ht '5\' 9"'$  (1.753 m)   Wt 98.8 kg Comment: bed scale  LMP 09/10/2021   SpO2 97%   BMI 32.17 kg/m   Intake/Output Summary (Last 24 hours) at 12/17/2022 1019 Last data filed at 12/17/2022 0400 Gross per 24 hour  Intake 1144.67 ml  Output 1200 ml  Net -55.33 ml   Intake/Output: I/O last 3 completed shifts: In: 1824.7 [P.O.:1320; I.V.:204.7; IV Piggyback:300] Out: 1200 [Other:1200]  Intake/Output this shift:  No intake/output data recorded. Weight change:  Gen: NAD CVS: RRR Resp:CTA Abd: +BS, sof,t NT/ND Ext: no edema  Recent Labs  Lab 12/12/22 1356 12/12/22 1419 12/14/22 0354 12/14/22 1923 12/16/22 0500 12/17/22 0642  NA 137 137 140 134* 133* 135  K 3.9 4.3 4.2 4.2 4.7 3.9  CL 98 101 99 96* 96* 92*  CO2 27  --  '23 25 23 26  '$ GLUCOSE 187* 191* 145* 98 99 107*  BUN 39* 40* 52* 24* 47* 30*  CREATININE 10.31* 10.90* 12.83* 7.24* 10.84* 8.09*  ALBUMIN 3.4*  --  3.1* 3.1* 3.2* 3.1*  CALCIUM 9.5  --  8.9 8.8* 9.1 9.6  PHOS  --   --  7.1* 4.8* 7.7* 5.0*  AST 24  --   --   --   --   --   ALT 61*  --   --   --   --   --    Liver Function Tests: Recent Labs  Lab 12/12/22 1356 12/14/22 0354 12/14/22 1923 12/16/22 0500 12/17/22 0642  AST 24  --   --   --   --   ALT 61*  --   --   --   --   ALKPHOS 108  --   --   --   --   BILITOT 0.4  --   --   --   --   PROT 7.9  --   --   --   --   ALBUMIN 3.4*   < > 3.1* 3.2* 3.1*   < > = values in this interval not displayed.   No results for input(s): "LIPASE", "AMYLASE" in the last 168 hours. Recent Labs  Lab 12/12/22 1356  AMMONIA 24   CBC: Recent Labs   Lab 12/12/22 1356 12/12/22 1419 12/14/22 0354 12/15/22 0403 12/16/22 0500 12/17/22 0642  WBC 6.6  --  6.5 6.5 7.6 8.0  NEUTROABS 4.1  --   --  3.9 4.9 5.4  HGB 10.4*   < > 10.4* 11.3* 10.5* 10.2*  HCT 34.2*   < > 33.0* 34.4* 32.5* 31.2*  MCV 97.7  --  96.2 92.0 93.7 93.4  PLT 133*  --  130* 131* 150 158   < > = values in this interval not displayed.   Cardiac Enzymes: No results for input(s): "CKTOTAL", "CKMB", "CKMBINDEX", "TROPONINI" in the last 168 hours. CBG: Recent Labs  Lab 12/16/22 2133 12/17/22 0000 12/17/22 0115 12/17/22 0636 12/17/22 0759  GLUCAP 178* 104* 105* 105* 118*    Iron Studies: No results for input(s): "IRON", "TIBC", "TRANSFERRIN", "FERRITIN" in the last  72 hours. Studies/Results: CT HEAD WO CONTRAST (5MM)  Result Date: 12/17/2022 CLINICAL DATA:  Follow-up examination for acute stroke. EXAM: CT HEAD WITHOUT CONTRAST TECHNIQUE: Contiguous axial images were obtained from the base of the skull through the vertex without intravenous contrast. RADIATION DOSE REDUCTION: This exam was performed according to the departmental dose-optimization program which includes automated exposure control, adjustment of the mA and/or kV according to patient size and/or use of iterative reconstruction technique. COMPARISON:  Prior brain MRI from 12/12/2022 as well as earlier studies. FINDINGS: Brain: Cerebral volume within normal limits. Underlying moderate chronic microvascular ischemic disease noted, stable. Chronic bilateral cerebellar infarcts noted, unchanged. Continue normal expected interval evolution of small subacute infarct involving the parasagittal left frontoparietal region. Previously identified small right occipital infarcts not visible. No other new visible acute large vessel territory infarct. No acute intracranial hemorrhage. No mass lesion or midline shift. No hydrocephalus or extra-axial fluid collection. Vascular: No abnormal hyperdense vessel. Calcified  atherosclerosis present about the skull base. Skull: Scalp soft tissues and calvarium demonstrate no acute finding. Sinuses/Orbits: Globes and orbital soft tissues within normal limits. Paranasal sinuses and mastoid air cells are largely clear. Other: None. IMPRESSION: 1. Normal expected interval evolution of small subacute infarct involving the parasagittal left frontoparietal region. No other new acute intracranial abnormality. 2. Underlying moderate chronic microvascular ischemic disease with chronic bilateral cerebellar infarcts, stable. Electronically Signed   By: Jeannine Boga M.D.   On: 12/17/2022 01:21   ECHO TEE  Result Date: 12/16/2022    TRANSESOPHOGEAL ECHO REPORT   Patient Name:   Heather [name] Date of Exam: 12/16/2022 Medical Rec #:  [id number]            Height:       69.0 in Accession #:    [id number]           Weight:       240.0 lb Date of Birth:  [birth year]           BSA:          2.232 m Patient Age:    73 years             BP:           200/86 mmHg Patient Gender: F                    HR:           101 bpm. Exam Location:  Inpatient Procedure: 3D Echo, Transesophageal Echo, Cardiac Doppler and Color Doppler Indications:     Mitral valve mass.  History:         Patient has prior history of Echocardiogram examinations, most                  recent 12/13/2022. Abnormal ECG, Stroke, Mitral Valve Disease,                  Arrythmias:Tachycardia and Cardiac Arrest,                  Signs/Symptoms:Dyspnea and Shortness of Breath; Risk                  Factors:Diabetes. ESRD.  Sonographer:     Roseanna Rainbow RDCS Referring Phys:  [id number] [place] Diagnosing Phys: Oswaldo Milian MD PROCEDURE: After discussion of the risks and benefits of a TEE, an informed consent was obtained from the patient. The transesophogeal probe was passed without difficulty through the esophogus of  the patient. Imaged were obtained with the patient in a left lateral decubitus position. Sedation  performed by different physician. The patient was monitored while under deep sedation. Anesthestetic sedation was provided intravenously by Anesthesiology: '227mg'$  of Propofol. The patient's vital signs; including heart rate, blood pressure, and oxygen saturation; remained stable throughout the procedure. The patient developed no complications during the procedure.  IMPRESSIONS  1. 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 Very mobile, prolapses across mitral valve in diastole  2. Left ventricular ejection fraction, by estimation, is 60 to 65%. The left ventricle has normal function.  3. Right ventricular systolic function is normal. The right ventricular size is normal.  4. The mitral valve is degenerative. Mild mitral valve regurgitation. Moderate mitral annular calcification.  5. The aortic valve is tricuspid. Aortic valve regurgitation is not visualized. Aortic valve sclerosis/calcification is present, without any evidence of aortic stenosis.  6. No left atrial/left atrial appendage thrombus was detected.  7. Agitated saline contrast bubble study was positive with shunting observed within 3-6 cardiac cycles suggestive of interatrial shunt.  8. Moderate pericardial effusion. FINDINGS  Left Ventricle: Left ventricular ejection fraction, by estimation, is 60 to 65%. The left ventricle has normal function. The left ventricular internal cavity size was normal in size. Right Ventricle: The right ventricular size is normal. No increase in right ventricular wall thickness. Right ventricular systolic function is normal. Left Atrium: Left atrial size was normal in size. No left atrial/left atrial appendage thrombus was detected. Right Atrium: Right atrial size was normal in size. Pericardium: A moderately sized pericardial effusion is present. Mitral Valve: The mitral valve is degenerative in appearance. Moderate mitral annular  calcification. Mild mitral valve regurgitation. Tricuspid Valve: The tricuspid valve is normal in structure. Tricuspid valve regurgitation is trivial. Aortic Valve: The aortic valve is tricuspid. Aortic valve regurgitation is not visualized. Aortic valve sclerosis/calcification is present, without any evidence of aortic stenosis. Aortic valve mean gradient measures 4.0 mmHg. Aortic valve peak gradient measures 9.1 mmHg. Pulmonic Valve: The pulmonic valve was not well visualized. Pulmonic valve regurgitation is trivial. Aorta: The aortic root is normal in size and structure. IAS/Shunts: No atrial level shunt detected by color flow Doppler. Agitated saline contrast was given intravenously to evaluate for intracardiac shunting. Agitated saline contrast bubble study was positive with shunting observed within 3-6 cardiac cycles suggestive of interatrial shunt. Additional Comments: Spectral Doppler performed. AORTIC VALVE AV Vmax:      151.00 cm/s AV Vmean:     95.400 cm/s AV VTI:       0.278 m AV Peak Grad: 9.1 mmHg AV Mean Grad: 4.0 mmHg Oswaldo Milian MD Electronically signed by Oswaldo Milian MD Signature Date/Time: 12/16/2022/4:26:48 PM    Final     aspirin  81 mg Oral Daily   atorvastatin  80 mg Oral QHS   Chlorhexidine Gluconate Cloth  6 each Topical Q0600   clopidogrel  75 mg Oral Daily   heparin  5,000 Units Subcutaneous Q8H   heparin sodium (porcine)       insulin aspart  0-15 Units Subcutaneous TID PC & HS   sodium chloride flush  10-40 mL Intracatheter Q12H    BMET    Component Value Date/Time   NA 135 12/17/2022 0642   NA 133 (A) 03/20/2019 0000   K 3.9 12/17/2022 0642   K 4.3 10/16/2013 0000   CL 92 (L) 12/17/2022 0642   CL 100  10/16/2013 0000   CO2 26 12/17/2022 0642   GLUCOSE 107 (H) 12/17/2022 0642   BUN 30 (H) 12/17/2022 0642   BUN 41 (A) 03/20/2019 0000   CREATININE 8.09 (H) 12/17/2022 0642   CREATININE 3.05 (H) 09/03/2015 0855   CALCIUM 9.6 12/17/2022 0642    CALCIUM 8.9 10/16/2013 0000   GFRNONAA 5 (L) 12/17/2022 0642   GFRNONAA 18 (L) 09/03/2015 0855   GFRAA 4 (L) 05/07/2020 0819   GFRAA 20 (L) 09/03/2015 0855   CBC    Component Value Date/Time   WBC 8.0 12/17/2022 0642   RBC 3.34 (L) 12/17/2022 0642   HGB 10.2 (L) 12/17/2022 0642   HCT 31.2 (L) 12/17/2022 0642   PLT 158 12/17/2022 0642   MCV 93.4 12/17/2022 0642   MCH 30.5 12/17/2022 0642   MCHC 32.7 12/17/2022 0642   RDW 15.1 12/17/2022 0642   LYMPHSABS 1.4 12/17/2022 0642   MONOABS 0.7 12/17/2022 0642   EOSABS 0.4 12/17/2022 0642   BASOSABS 0.0 12/17/2022 9458    Assessment/Plan:  New embolic stroke: neurology following. Recent endocarditis. HD center did have to use ceftaz at one point due to cefepime shortage but appears she otherwise completed her IV abx. Repeat echo with mass on posterior mitral valve. 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  ID and CT surgery following as well and await recommendations.   Acute encephalopathy: new embolic stroke on MRI but also took baclofen which is contraindicated in ESRD. Stop baclofen and continue with HD.  Thankfully she has woken up and is speaking.  ESRD:  On MWF schedule, completed HD earlier today then get back on schedule Sunday (holiday schedule for MWF patients)   Hypertension/volume: Bp significantly elevated on arrival. Neurology recommended normotensive BP goal. UF with HD as tolerated today.   Anemia: Hgb above goal, no ESA indicated at this time.   Metabolic bone disease: Continue binders (calcium acetate 3 caps per meal). Continue VDRA  Nutrition:  Currently NPO T2DM: management per primary team  Donetta Potts, MD [facility]

## 2022-12-17 NOTE — Progress Notes (Signed)
[name] for Infectious Disease  Date of Admission:  12/12/2022           Reason for visit: Follow up on mitral valve endocarditis  Current antibiotics: Vancomycin Cefepime   ASSESSMENT:    51 y.o. 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  She completed this antibiotic course in mid November.  Ceftazidime was used briefly due to cefepime shortage at HD, but doubt this is of any significance.  She developed CNS embolic infarcts while on antibiotic therapy as noted on MRI in October 2023.  Follow-up MRI this admission in the setting of encephalopathy showed new infarcts from prior.  TEE was done 12/16/2022 which showed similar appearance of mitral valve mass from her prior TEE in October.  Unclear if this truly represents infective endocarditis as would expect a vegetation to have some resorption following prolonged antibiotic therapy.  Additionally, blood cultures have been negative and she has not had any infectious symptoms.  Further workup for atypical ID etiologies is pending as well as workup for nonbacterial thrombotic endocarditis.  #ESRD on HD: She receives dialysis via a tunneled HD line.  Kidney failure secondary to poorly controlled diabetes and hypertension as best she is aware and from prior notes.  #Encephalopathy: Appears to be medication related.  RECOMMENDATIONS:    Continue vancomycin and cefepime for now pending further workup for infectious and noninfectious causes of endocarditis Await CVTS reevaluation Ideally, would be able to obtain tissue to help with diagnosis in this setting Will follow.  Dr. Baxter Flattery will be here 12/23-12/24; Dr West Bali 12/25-12/27.   Principal Problem:   Acute embolic stroke [facility]) Active Problems:   Diabetes mellitus, type II, insulin dependent ([place]), on CGM, with end organ damage: renal,  neuropathy, gastroparesis, retinopathy   Hypertension associated with diabetes ([place])   Anemia in chronic kidney disease, on chronic dialysis (HCC)   ESRD on hemodialysis (HCC)   S/P BKA (below knee amputation), right (HCC)   ESRD (end stage renal disease) on dialysis ([place])   PVD (peripheral vascular disease) ([place])   Acute metabolic encephalopathy   Encephalopathy   Cardiac valve mass   DNR (do not resuscitate) discussion   Palliative care by specialist    MEDICATIONS:    Scheduled Meds:  aspirin  81 mg Oral Daily   atorvastatin  80 mg Oral QHS   Chlorhexidine Gluconate Cloth  6 each Topical Q0600   clopidogrel  75 mg Oral Daily   heparin  5,000 Units Subcutaneous Q8H   heparin sodium (porcine)       insulin aspart  0-15 Units Subcutaneous TID PC & HS   sodium chloride flush  10-40 mL Intracatheter Q12H   Continuous Infusions:  ceFEPime (MAXIPIME) IV Stopped (12/16/22 2030)   PRN Meds:.acetaminophen **OR** acetaminophen (TYLENOL) oral liquid 160 mg/5 mL **OR** acetaminophen, heparin sodium (porcine), labetalol, sodium chloride flush  SUBJECTIVE:   24 hour events:  Patient received dialysis yesterday.  She had some decreased LOC while in HD.  Stat head CT was obtained and stable.  This was felt possibly related to Benadryl she received. Status post TEE yesterday with cardiology.  This showed a long thin mobile mass attached to the posterior mitral valve annulus, similar in appearance to prior TEE.  CVTS consult pending.  Remains afebrile.  Tmax 98.5.  WBC 8.0.  She finished HD this morning.  [name] more  depressed and tired today.  Her husband is at the bedside.   Review of Systems  All other systems reviewed and are negative.     OBJECTIVE:   Blood pressure (!) 198/92, pulse (!) 101, temperature 97.9 F (36.6 C), temperature source Oral, resp. rate 14, height '5\' 9"'$  (1.753 m), weight 98.8 kg, last menstrual period 09/10/2021, SpO2 97 %. Body mass index is 32.17  kg/m.  Physical Exam Constitutional:      Comments: [name] more tired and appears depressed this morning.  [name] however alert and oriented.   Pulmonary:     Effort: Pulmonary effort is normal. No respiratory distress.  Abdominal:     General: There is no distension.     Palpations: Abdomen is soft.  Musculoskeletal:     Comments: Right BKA  Skin:    General: Skin is warm and dry.  Neurological:     General: No focal deficit present.     Mental Status: Mental status is at baseline.  Psychiatric:        Mood and Affect: Mood normal.        Behavior: Behavior normal.      Lab Results: Lab Results  Component Value Date   WBC 8.0 12/17/2022   HGB 10.2 (L) 12/17/2022   HCT 31.2 (L) 12/17/2022   MCV 93.4 12/17/2022   PLT 158 12/17/2022    Lab Results  Component Value Date   NA 135 12/17/2022   K 3.9 12/17/2022   CO2 26 12/17/2022   GLUCOSE 107 (H) 12/17/2022   BUN 30 (H) 12/17/2022   CREATININE 8.09 (H) 12/17/2022   CALCIUM 9.6 12/17/2022   GFRNONAA 5 (L) 12/17/2022   GFRAA 4 (L) 05/07/2020    Lab Results  Component Value Date   ALT 61 (H) 12/12/2022   AST 24 12/12/2022   ALKPHOS 108 12/12/2022   BILITOT 0.4 12/12/2022       Component Value Date/Time   CRP 6.8 (H) 09/30/2022 2023       Component Value Date/Time   ESRSEDRATE 52 (H) 09/30/2022 2023     I have reviewed the micro and lab results in Epic.  Imaging: CT HEAD WO CONTRAST (5MM)  Result Date: 12/17/2022 CLINICAL DATA:  Follow-up examination for acute stroke. EXAM: CT HEAD WITHOUT CONTRAST TECHNIQUE: Contiguous axial images were obtained from the base of the skull through the vertex without intravenous contrast. RADIATION DOSE REDUCTION: This exam was performed according to the departmental dose-optimization program which includes automated exposure control, adjustment of the mA and/or kV according to patient size and/or use of iterative reconstruction technique. COMPARISON:  Prior brain MRI from  12/12/2022 as well as earlier studies. FINDINGS: Brain: Cerebral volume within normal limits. Underlying moderate chronic microvascular ischemic disease noted, stable. Chronic bilateral cerebellar infarcts noted, unchanged. Continue normal expected interval evolution of small subacute infarct involving the parasagittal left frontoparietal region. Previously identified small right occipital infarcts not visible. No other new visible acute large vessel territory infarct. No acute intracranial hemorrhage. No mass lesion or midline shift. No hydrocephalus or extra-axial fluid collection. Vascular: No abnormal hyperdense vessel. Calcified atherosclerosis present about the skull base. Skull: Scalp soft tissues and calvarium demonstrate no acute finding. Sinuses/Orbits: Globes and orbital soft tissues within normal limits. Paranasal sinuses and mastoid air cells are largely clear. Other: None. IMPRESSION: 1. Normal expected interval evolution of small subacute infarct involving the parasagittal left frontoparietal region. No other new acute intracranial abnormality. 2. Underlying moderate chronic microvascular ischemic  disease with chronic bilateral cerebellar infarcts, stable. Electronically Signed   By: Jeannine Boga M.D.   On: 12/17/2022 01:21   ECHO TEE  Result Date: 12/16/2022    TRANSESOPHOGEAL ECHO REPORT   Patient Name:   Heather [name] Date of Exam: 12/16/2022 Medical Rec #:  [id number]            Height:       69.0 in Accession #:    [id number]           Weight:       240.0 lb Date of Birth:  Feb 17, 1971           BSA:          2.232 m Patient Age:    89 years             BP:           200/86 mmHg Patient Gender: F                    HR:           101 bpm. Exam Location:  Inpatient Procedure: 3D Echo, Transesophageal Echo, Cardiac Doppler and Color Doppler Indications:     Mitral valve mass.  History:         Patient has prior history of Echocardiogram examinations, most                  recent  12/13/2022. Abnormal ECG, Stroke, Mitral Valve Disease,                  Arrythmias:Tachycardia and Cardiac Arrest,                  Signs/Symptoms:Dyspnea and Shortness of Breath; Risk                  Factors:Diabetes. ESRD.  Sonographer:     Roseanna Rainbow RDCS Referring Phys:  [id number] [place] Diagnosing Phys: Oswaldo Milian MD PROCEDURE: After discussion of the risks and benefits of a TEE, an informed consent was obtained from the patient. The transesophogeal probe was passed without difficulty through the esophogus of the patient. Imaged were obtained with the patient in a left lateral decubitus position. Sedation performed by different physician. The patient was monitored while under deep sedation. Anesthestetic sedation was provided intravenously by Anesthesiology: '227mg'$  of Propofol. The patient's vital signs; including heart rate, blood pressure, and oxygen saturation; remained stable throughout the procedure. The patient developed no complications during the procedure.  IMPRESSIONS  1. 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 Very mobile, prolapses across mitral valve in diastole  2. Left ventricular ejection fraction, by estimation, is 60 to 65%. The left ventricle has normal function.  3. Right ventricular systolic function is normal. The right ventricular size is normal.  4. The mitral valve is degenerative. Mild mitral valve regurgitation. Moderate mitral annular calcification.  5. The aortic valve is tricuspid. Aortic valve regurgitation is not visualized. Aortic valve sclerosis/calcification is present, without any evidence of aortic stenosis.  6. No left atrial/left atrial appendage thrombus was detected.  7. Agitated saline contrast bubble study was positive with shunting observed within 3-6 cardiac cycles suggestive of interatrial shunt.  8. Moderate pericardial effusion. FINDINGS  Left Ventricle:  Left ventricular ejection fraction, by estimation, is 60 to 65%. The left ventricle has normal function. The left ventricular internal cavity size was normal  in size. Right Ventricle: The right ventricular size is normal. No increase in right ventricular wall thickness. Right ventricular systolic function is normal. Left Atrium: Left atrial size was normal in size. No left atrial/left atrial appendage thrombus was detected. Right Atrium: Right atrial size was normal in size. Pericardium: A moderately sized pericardial effusion is present. Mitral Valve: The mitral valve is degenerative in appearance. Moderate mitral annular calcification. Mild mitral valve regurgitation. Tricuspid Valve: The tricuspid valve is normal in structure. Tricuspid valve regurgitation is trivial. Aortic Valve: The aortic valve is tricuspid. Aortic valve regurgitation is not visualized. Aortic valve sclerosis/calcification is present, without any evidence of aortic stenosis. Aortic valve mean gradient measures 4.0 mmHg. Aortic valve peak gradient measures 9.1 mmHg. Pulmonic Valve: The pulmonic valve was not well visualized. Pulmonic valve regurgitation is trivial. Aorta: The aortic root is normal in size and structure. IAS/Shunts: No atrial level shunt detected by color flow Doppler. Agitated saline contrast was given intravenously to evaluate for intracardiac shunting. Agitated saline contrast bubble study was positive with shunting observed within 3-6 cardiac cycles suggestive of interatrial shunt. Additional Comments: Spectral Doppler performed. AORTIC VALVE AV Vmax:      151.00 cm/s AV Vmean:     95.400 cm/s AV VTI:       0.278 m AV Peak Grad: 9.1 mmHg AV Mean Grad: 4.0 mmHg Oswaldo Milian MD Electronically signed by Oswaldo Milian MD Signature Date/Time: 12/16/2022/4:26:48 PM    Final      Imaging independently reviewed in Epic.    [facility] [place]  [facility] [phone] pager 12/17/2022, 8:42 AM

## 2022-12-17 NOTE — Progress Notes (Signed)
Received patient in bed to unit.  Alert and oriented.  Informed consent signed and in chart.   Treatment initiated: 2211 Treatment completed: 0022   Access used: dialysis cath Access issues: none  Total UF removed: 1.2  Medication(s) given: benadryl '25mg'$  Post HD VS: see table Post HD weight: unable to get   12/17/22 0009  Vitals  BP 138/73  MAP (mmHg) 92  BP Location Right Arm  BP Method Automatic  Patient Position (if appropriate) Lying  Pulse Rate 97  Pulse Rate Source Monitor  ECG Heart Rate 99  Resp 17  Oxygen Therapy  SpO2 96 %  During Treatment Monitoring  Intra-Hemodialysis Comments Progressing as prescribed  Hemodialysis Catheter Right Internal jugular Double lumen Permanent (Tunneled)  Placement Date/Time: 10/04/22 1534   Serial / Lot #: [id number]  Expiration Date: 05/04/27  Time Out: Correct patient;Correct site;Correct procedure  Maximum sterile barrier precautions: Sterile gloves;Hand hygiene;Cap;Large sterile sheet;Mask;Sterile...  Site Condition No complications  Blue Lumen Status Infusing  Red Lumen Status Infusing  Purple Lumen Status N/A      [facility]

## 2022-12-17 NOTE — Significant Event (Signed)
Rapid Response Event Note   Reason for Call :  Decreased LOC in HD.  Per HD RN, pt arrived to HD alert and oriented, conversant.  [name] now lethargic, slow to respond.   She was given benadryl at 2314.  Initial Focused Assessment:  Pt lying in bed with eyes open, in no visible distress. She nods/shakes head to simple questions, will speak but only intermittently. She seems to have equal strength but very poor effort. Pupils are 4, equal, and sluggish. NIH-7. Skin warm and dry.  HR-90s, BP-102/75, RR-16, SpO2-96% on RA.   Interventions:  CBG-104 STAT CT head-1. Normal expected interval evolution of small subacute infarct involving the parasagittal left frontoparietal region. No other new acute intracranial abnormality. 2. Underlying moderate chronic microvascular ischemic disease with chronic bilateral cerebellar infarcts, stable. Plan of Care:  ?whether this is a response to the benadryl she was given as pt was not lethargic prior to administration. Continue to monitor pt closely. Call RRT if further assistance needed.   Event Summary:   MD Notified: Dr. Marlowe Sax, Dr. Leonel Ramsay Call UORV:[phone] Arrival [phone] End Time:0140  Dillard Essex, RN

## 2022-12-17 NOTE — Progress Notes (Addendum)
STROKE TEAM PROGRESS NOTE   INTERVAL HISTORY Called to see patient due to new complaint of blurred vision on right side.  Patient states that she has new onset binocular diplopia with downgaze, which occurred last night.  On exam, patient is able to see motion in right visual field but cannot count fingers.  Patient's husband also states that she has been having visual hallucinations of children playing outside the window, but this is explainable by delirium. CT head without contrast done today shows no acute abnormality Vitals:   12/17/22 0600 12/17/22 0814 12/17/22 1228 12/17/22 1500  BP:  (!) 198/92 (!) 188/85 (!) 111/54  Pulse:   94 92  Resp: '14  15 15  '$ Temp:  97.9 F (36.6 C) 98 F (36.7 C) 98 F (36.7 C)  TempSrc:  Oral Oral Oral  SpO2:   97% 98%  Weight:      Height:       CBC:  Recent Labs  Lab 12/16/22 0500 12/17/22 0642  WBC 7.6 8.0  NEUTROABS 4.9 5.4  HGB 10.5* 10.2*  HCT 32.5* 31.2*  MCV 93.7 93.4  PLT 150 426    Basic Metabolic Panel:  Recent Labs  Lab 12/16/22 0500 12/17/22 0642  NA 133* 135  K 4.7 3.9  CL 96* 92*  CO2 23 26  GLUCOSE 99 107*  BUN 47* 30*  CREATININE 10.84* 8.09*  CALCIUM 9.1 9.6  MG 2.3 2.1  PHOS 7.7* 5.0*    Urine Drug Screen: No results for input(s): "LABOPIA", "COCAINSCRNUR", "LABBENZ", "AMPHETMU", "THCU", "LABBARB" in the last 168 hours.  Alcohol Level No results for input(s): "ETH" in the last 168 hours.  IMAGING past 24 hours CT HEAD WO CONTRAST (5MM)  Result Date: 12/17/2022 CLINICAL DATA:  Follow-up examination for acute stroke. EXAM: CT HEAD WITHOUT CONTRAST TECHNIQUE: Contiguous axial images were obtained from the base of the skull through the vertex without intravenous contrast. RADIATION DOSE REDUCTION: This exam was performed according to the departmental dose-optimization program which includes automated exposure control, adjustment of the mA and/or kV according to patient size and/or use of iterative reconstruction  technique. COMPARISON:  Prior brain MRI from 12/12/2022 as well as earlier studies. FINDINGS: Brain: Cerebral volume within normal limits. Underlying moderate chronic microvascular ischemic disease noted, stable. Chronic bilateral cerebellar infarcts noted, unchanged. Continue normal expected interval evolution of small subacute infarct involving the parasagittal left frontoparietal region. Previously identified small right occipital infarcts not visible. No other new visible acute large vessel territory infarct. No acute intracranial hemorrhage. No mass lesion or midline shift. No hydrocephalus or extra-axial fluid collection. Vascular: No abnormal hyperdense vessel. Calcified atherosclerosis present about the skull base. Skull: Scalp soft tissues and calvarium demonstrate no acute finding. Sinuses/Orbits: Globes and orbital soft tissues within normal limits. Paranasal sinuses and mastoid air cells are largely clear. Other: None. IMPRESSION: 1. Normal expected interval evolution of small subacute infarct involving the parasagittal left frontoparietal region. No other new acute intracranial abnormality. 2. Underlying moderate chronic microvascular ischemic disease with chronic bilateral cerebellar infarcts, stable. Electronically Signed   By: Jeannine Boga M.D.   On: 12/17/2022 01:21    PHYSICAL EXAM General: in no acute distress and appears chronically-ill HEENT: normocephalic and atraumatic Cardiovascular: regular rate Respiratory: normal respiratory effort and on RA Gastrointestinal: non-tender Extremities: moving all extremities spontaneously, BKA on R leg   NEURO:  Mental Status: AA&Ox3  Speech/Language: speech is without dysarthria or aphasia.    Cranial Nerves:  II: PERRL with diplopia  on downgaze, only with both eyes open.  Able to sense motion in right visual field, but unable to count fingers there III, IV, VI: EOMI. Eyelids elevate symmetrically.  V: Sensation is intact to light  touch and symmetrical to face.  VII: Smile is symmetrical.   VIII: hearing intact to voice. IX, X: Phonation is normal.  TT:[id number] shrug 5/5. XII: tongue is midline without fasciculations. Motor: 5/5 strength to all muscle groups tested.  Tone: is normal and bulk is normal Sensation- Intact to light touch bilaterally. Coordination: FTN intact bilaterally Gait- deferred   ASSESSMENT/PLAN Heather [name] is a 51 y.o. 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  Found to have endocarditis at that time and from a stroke prevention standpoint recommended aspirin only.  She was on Eliquis due to DVT/PE history which was discontinued after the infective endocarditis. She comes back again to the emergency room on 12/12/2022 after being found down and altered at home. MRI of the brain shows new ischemic infarcts when compared to October. Given new finding of blurred vision in right eye as well as new downgaze diplopia, suspect patient may have had a new small brainstem stroke.  At this point, MRI would not change patient management.  Recommend continuing antibiotics for endocarditis.  Also recommend OT reconsult for assistance with diplopia.  #Acute metabolic encephalopathy  #Acute/subacute R occipital non-hemorrhagic strokes #Prior posterior L cerebellar infarct #Prior L frontal parietal cortex non-hemorrhagic stroke Code Stroke CT head No acute intracranial hemorrhage, mass effect or midline shift. Low attenuation in the left paramedian frontoparietal region suggesting recent infarct, similar to prior examination.  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 Advanced chronic microvascular ischemic changes of the white matter, unchanged. MRI shows 2 foci of restricted diffusion within the subcortical white matter of the right occipital pole consistent with acute/subacute nonhemorrhagic infarcts. 9 mm posterior left cerebellar infarct is new since the prior MRI, but not acute. Expected evolution of nonhemorrhagic infarct in the medial left frontal parietal cortex. Moderate generalized atrophy and advanced white matter disease likely reflects the sequela of chronic microvascular ischemia. 2D Echo EF 60-65% with mild LVH, notable highly mobile mass on posterior MV measuring 1.9 cm x 0.5 cm  LDL pending HgbA1c 7.4 VTE prophylaxis - heparin subq    Diet   Diet renal/carb modified with fluid restriction Diet-HS Snack? Nothing; Fluid restriction: 1200 mL Fluid; Room service appropriate? Yes; Fluid consistency: Thin   No antithrombotic prior to admission, now on aspirin 81 mg daily and clopidogrel 75 mg daily. aspirin 81 mg and clopidogrel 75 mg daily for 21 days, then clopidogrel 75 mg alone daily indefinitely EEG negative for seizures, ordered overnight EEG given continued encephalopathy Therapy recommendations:  pending medical improvement Disposition:  pending medical clearance, stroke workup  Hypertension Home meds:  amlodipine Stable Permissive hypertension (OK if < 220/120) but gradually normalize in 5-7 days Long-term BP goal normotensive  Hyperlipidemia Home meds: rosuvastatin 10 mg LDL pending, goal < 70  Diabetes type II Uncontrolled Home meds:  insulin HgbA1c 7.4, goal < 7.0 CBGs Recent Labs    12/17/22 0636 12/17/22 0759 12/17/22 1227  GLUCAP 105* 118* 135*     SSI  Other Stroke Risk Factors Obesity, Body mass index is 32.17 kg/m., BMI >/= 30 associated with increased stroke risk, recommend weight loss, diet and exercise as appropriate   Other  Active Problems PVD ESRD on dialysis  Anemia in chronic kidney disease, on chronic dialysis  Hospital day # [place] , MSN, AGACNP-BC [facility] See Amion for schedule and pager information 12/17/2022 3:45 PM   I have personally obtained history,examined this patient, reviewed notes, independently viewed imaging studies, participated in medical decision making and plan of care.ROS completed by me personally and pertinent positives fully documented  I have made any additions or clarifications directly to the above note. Agree with note above.  Patient has no complaints of binocular diplopia in downgaze and eye movement exam shows no ophthalmoplegia.  CT head shows no acute abnormality.  It is conceivable that she may have had a small brainstem infarct secondary to endocarditis and further brain imaging with MRI is unlikely to have any impact on treatment decision and so would hold off.  Recommend occupational therapy consult for eyepatch or eyeglasses with tape to help with diplopia.  Continue antibiotics.  No further neurological testing is needed.  Long discussion with patient and husband at the bedside and answered questions.  Discussed with Dr.Kshitz.  Stroke team will sign off.  Kindly call for questions.  Greater than 50% time during this 35-minute visit was spent in counseling and coordination of care and discussion patient care team and answering questions.  Antony Contras, MD Medical Director [facility] Pager: [phone] 12/17/2022 5:07 PM  To contact Stroke Continuity provider, please refer to [email or web address] After hours, contact General Neurology

## 2022-12-17 NOTE — Progress Notes (Signed)
This chaplain responded to the spiritual care consult for naming the Pt. husband-Charles as the Pt. HCPOA.  Juanda Crumble is at the Pt. bedside participating in AD education with the Pt. The chaplain informed Juanda Crumble he is Air traffic controller without documentation. The Pt. is awake and attempting to engage with the chaplain. The Pt. is without complete clarification of the role of HCPOA during this visit.  The chaplain left the document at the bedside and will revisit the Pt. after the holidays.  The chaplain observed Juanda Crumble attentiveness to the Pt. requests. The chaplain understands the Pt. parents are clergy leaders in the community.    This chaplain provided education on how to request a chaplain through the RN over the weekend. AD services are not available on the weekends.  Chaplain Sallyanne Kuster [phone]

## 2022-12-17 NOTE — TOC Progression Note (Signed)
Transition of Care [facility]) - Progression Note    Patient Details  Name: Heather [name] MRN: [id number] Date of Birth: 05/16/1971  [facility] CM/SW Contact  Jinger Neighbors, [place] Phone Number: 12/17/2022, 11:10 AM  Clinical Narrative:     CSW attempted to meet with pt and her spouse; however, CSW was met at the door by Palliative NP who discussed current status and recent changes with pt. NP reports pt is tearful and appears depressed based on new changes and doesn't think now would be a good time to discuss SNF. NP is asking neurology for another consult.     Barriers to Discharge: SNF Pending bed offer  Expected Discharge Plan and Services                                               Social Determinants of Health (SDOH) Interventions SDOH Screenings   Food Insecurity: No Food Insecurity (12/15/2022)  Housing: Low Risk  (12/15/2022)  Transportation Needs: No Transportation Needs (12/15/2022)  Utilities: Not At Risk (12/15/2022)  Depression (PHQ2-9): Low Risk  (10/26/2022)  Financial Resource Strain: Low Risk  (10/26/2022)  Physical Activity: Inactive (10/26/2022)  Social Connections: Moderately Integrated (10/26/2022)  Stress: No Stress Concern Present (10/26/2022)  Tobacco Use: Low Risk  (12/16/2022)    Readmission Risk Interventions     No data to display

## 2022-12-17 NOTE — Progress Notes (Signed)
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  MRI brain on admission was positive for new possible embolic right occipital infarcts when compared to October.  Neurology was consulted.  She has had issues with encephalopathy, aphasia and dysphagia which is slowly improving.  Nephrology consulted to continue dialysis.  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  Cardiology was consulted for TEE and ID was consulted.  She underwent TEE on 12/16/2022  Assessment & Plan:   Acute ischemic right occipital infarcts, possibly embolic Acute metabolic encephalopathy -Presented with encephalopathy, aphasia and was nonverbal and initially failed SLP eval -MRI brain showed new right occipital ischemic infarcts possibly from the valvular mass/vegetation.  Neurology recommended aspirin and Plavix for 3 weeks then aspirin alone. -A1c 7.4.  LDL 135.  Continue Lipitor. -EEG negative for seizures.  LTM EEG negative for seizures.  Mental status had improved significantly over the last couple of days and patient was tolerating diet as per SLP.  However, last night, she was found to be lethargic during hemodialysis, probably after being given Benadryl.  Repeat CT head did not show any acute new intracranial abnormality.  Mental status subsequently improved slightly.  Patient is still slow to respond this morning.  Patient/husband requesting neurology evaluation.  Will let stroke team know. 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  Cardiothoracic surgery consultation is pending.  Follow recommendations. -Currently on cefepime and vancomycin as per ID recommendations  End-stage renal disease on hemodialysis -Nephrology following.  Dialysis as per nephrology schedule  History of PVD status post right BKA -Currently wheelchair-bound  Anemia of chronic disease -From renal failure.  Hemoglobin stable.  Monitor intermittently.  Hyponatremia -Managed by nephrology by hemodialysis.  Improved  Thrombocytopenia -Questionable cause.  Resolved  Diabetes mellitus type 2 with hyperglycemia -A1c 7.9.  Continue CBGs with SSI  Goals of care -Palliative care consultation appreciated.  Currently remains full code.  Obesity -Outpatient follow-up   DVT prophylaxis: Heparin subcutaneous Code Status: Full Family Communication: Husband at bedside Disposition Plan: Status is: Inpatient Remains inpatient appropriate because: Of severity of illness    Consultants: Nephrology/neurology/cardiothoracic surgery/ID.  Palliative care  Procedures: As above  Antimicrobials:  Anti-infectives (From admission, onward)    Start     Dose/Rate Route Frequency Ordered Stop   12/16/22 1200  vancomycin (VANCOCIN) IVPB 1000 mg/200 mL premix        1,000 mg 200 mL/hr over 60 Minutes Intravenous  Once 12/15/22 1228     12/15/22 1200  vancomycin (VANCOCIN) IVPB 1000 mg/200 mL premix  Status:  Discontinued        1,000 mg 200 mL/hr over 60 Minutes Intravenous Every M-W-F (Hemodialysis) 12/13/22 1823 12/15/22 1228   12/14/22 2200  ceFEPIme (MAXIPIME) 1 g in sodium chloride 0.9 % 100 mL IVPB  1 g 200 mL/hr over 30 Minutes Intravenous Every 24 hours 12/14/22 1322     12/14/22  1500  vancomycin (VANCOCIN) IVPB 1000 mg/200 mL premix        1,000 mg 200 mL/hr over 60 Minutes Intravenous  Once 12/14/22 1329 12/14/22 1532   12/13/22 2200  piperacillin-tazobactam (ZOSYN) IVPB 2.25 g  Status:  Discontinued        2.25 g 100 mL/hr over 30 Minutes Intravenous Every 8 hours 12/13/22 1820 12/14/22 1322   12/13/22 1830  vancomycin (VANCOCIN) 2,250 mg in sodium chloride 0.9 % 500 mL IVPB        2,250 mg 261.3 mL/hr over 120 Minutes Intravenous  Once 12/13/22 1820 12/14/22 0115        Subjective: Patient seen and examined at bedside.  Awake, slow to respond, answers some questions.  No fever, vomiting, chest pain or worsening shortness of breath reported. Objective: Vitals:   12/17/22 0325 12/17/22 0331 12/17/22 0400 12/17/22 0600  BP: (!) 160/77     Pulse: (!) 101     Resp: '15 14 12 14  '$ Temp: 98.5 F (36.9 C)     TempSrc: Oral     SpO2: 97%     Weight:      Height:        Intake/Output Summary (Last 24 hours) at 12/17/2022 0749 Last data filed at 12/17/2022 0400 Gross per 24 hour  Intake 1144.67 ml  Output 1200 ml  Net -55.33 ml    Filed Weights   12/12/22 1306 12/16/22 1321 12/16/22 2152  Weight: 103 kg 108.9 kg 98.8 kg    Examination:  General: On room air.  No distress.  Looks chronically ill and deconditioned. ENT/neck: No thyromegaly.  JVD is not elevated  respiratory: Decreased breath sounds at bases bilaterally with some crackles; no wheezing  CVS: S1-S2 heard, mild intermittent tachycardia present  abdominal: Soft, nontender, slightly distended; no organomegaly,  bowel sounds are heard Extremities: Trace lower extremity edema on the left side; no cyanosis; right BKA present CNS: Awake, slow to respond but answers questions appropriately.  No focal neurologic deficit.  Moves extremities Lymph: No obvious lymphadenopathy Skin: No obvious ecchymosis/lesions  psych: Not agitated currently.  Looks intermittently anxious musculoskeletal: No  obvious joint swelling/deformity   Data Reviewed: I have personally reviewed following labs and imaging studies  CBC: Recent Labs  Lab 12/12/22 1356 12/12/22 1419 12/14/22 0354 12/15/22 0403 12/16/22 0500 12/17/22 0642  WBC 6.6  --  6.5 6.5 7.6 8.0  NEUTROABS 4.1  --   --  3.9 4.9 5.4  HGB 10.4* 13.3 10.4* 11.3* 10.5* 10.2*  HCT 34.2* 39.0 33.0* 34.4* 32.5* 31.2*  MCV 97.7  --  96.2 92.0 93.7 93.4  PLT 133*  --  130* 131* 150 762    Basic Metabolic Panel: Recent Labs  Lab 12/12/22 1356 12/12/22 1419 12/14/22 0354 12/14/22 1923 12/15/22 0403 12/16/22 0500 12/17/22 0642  NA 137 137 140 134*  --  133* 135  K 3.9 4.3 4.2 4.2  --  4.7 3.9  CL 98 101 99 96*  --  96* 92*  CO2 27  --  23 25  --  23 26  GLUCOSE 187* 191* 145* 98  --  99 107*  BUN 39* 40* 52* 24*  --  47* 30*  CREATININE 10.31* 10.90* 12.83* 7.24*  --  10.84* 8.09*  CALCIUM 9.5  --  8.9 8.8*  --  9.1 9.6  MG  --   --   --   --  2.0 2.3  --   PHOS  --   --  7.1* 4.8*  --  7.7* 5.0*    GFR: Estimated Creatinine Clearance: 10.1 mL/min (A) (by C-G formula based on SCr of 8.09 mg/dL (H)). Liver Function Tests: Recent Labs  Lab 12/12/22 1356 12/14/22 0354 12/14/22 1923 12/16/22 0500 12/17/22 0642  AST 24  --   --   --   --   ALT 61*  --   --   --   --   ALKPHOS 108  --   --   --   --   BILITOT 0.4  --   --   --   --   PROT 7.9  --   --   --   --   ALBUMIN 3.4* 3.1* 3.1* 3.2* 3.1*    No results for input(s): "LIPASE", "AMYLASE" in the last 168 hours. Recent Labs  Lab 12/12/22 1356  AMMONIA 24    Coagulation Profile: No results for input(s): "INR", "PROTIME" in the last 168 hours. Cardiac Enzymes: No results for input(s): "CKTOTAL", "CKMB", "CKMBINDEX", "TROPONINI" in the last 168 hours. BNP (last 3 results) No results for input(s): "PROBNP" in the last 8760 hours. HbA1C: No results for input(s): "HGBA1C" in the last 72 hours.  CBG: Recent Labs  Lab 12/16/22 1830 12/16/22 2133  12/17/22 0000 12/17/22 0115 12/17/22 0636  GLUCAP 77 178* 104* 105* 105*    Lipid Profile: No results for input(s): "CHOL", "HDL", "LDLCALC", "TRIG", "CHOLHDL", "LDLDIRECT" in the last 72 hours.  Thyroid Function Tests: No results for input(s): "TSH", "T4TOTAL", "FREET4", "T3FREE", "THYROIDAB" in the last 72 hours.  Anemia Panel: No results for input(s): "VITAMINB12", "FOLATE", "FERRITIN", "TIBC", "IRON", "RETICCTPCT" in the last 72 hours.  Sepsis Labs: No results for input(s): "PROCALCITON", "LATICACIDVEN" in the last 168 hours.  Recent Results (from the past 240 hour(s))  Resp panel by RT-PCR (RSV, Flu A&B, Covid) Anterior Nasal Swab     Status: None   Collection Time: 12/12/22  1:57 PM   Specimen: Anterior Nasal Swab  Result Value Ref Range Status   SARS Coronavirus 2 by RT PCR NEGATIVE NEGATIVE Final    Comment: (NOTE) SARS-CoV-2 target nucleic acids are NOT DETECTED.  The SARS-CoV-2 RNA is generally detectable in upper respiratory specimens during the acute phase of infection. The lowest concentration of SARS-CoV-2 viral copies this assay can detect is 138 copies/mL. A negative result does not preclude SARS-Cov-2 infection and should not be used as the sole basis for treatment or other patient management decisions. A negative result may occur with  improper specimen collection/handling, submission of specimen other than nasopharyngeal swab, presence of viral mutation(s) within the areas targeted by this assay, and inadequate number of viral copies(<138 copies/mL). A negative result must be combined with clinical observations, patient history, and epidemiological information. The expected result is Negative.  Fact Sheet for Patients:  EntrepreneurPulse.com.au  Fact Sheet for Healthcare Providers:  IncredibleEmployment.be  This test is no t yet approved or cleared by the [place] FDA and  has been authorized for detection  and/or diagnosis of SARS-CoV-2 by FDA under an Emergency Use Authorization (EUA). This EUA will remain  in effect (meaning this test can be used) for the duration of the COVID-19 declaration under Section 564(b)(1) of the Act, 21 U.S.C.section 360bbb-3(b)(1), unless the authorization is terminated  or revoked sooner.       Influenza A by PCR NEGATIVE NEGATIVE Final   Influenza B by PCR NEGATIVE NEGATIVE Final  Comment: (NOTE) The Xpert Xpress SARS-CoV-2/FLU/RSV plus assay is intended as an aid in the diagnosis of influenza from Nasopharyngeal swab specimens and should not be used as a sole basis for treatment. Nasal washings and aspirates are unacceptable for Xpert Xpress SARS-CoV-2/FLU/RSV testing.  Fact Sheet for Patients: EntrepreneurPulse.com.au  Fact Sheet for Healthcare Providers: IncredibleEmployment.be  This test is not yet approved or cleared by the [place] FDA and has been authorized for detection and/or diagnosis of SARS-CoV-2 by FDA under an Emergency Use Authorization (EUA). This EUA will remain in effect (meaning this test can be used) for the duration of the COVID-19 declaration under Section 564(b)(1) of the Act, 21 U.S.C. section 360bbb-3(b)(1), unless the authorization is terminated or revoked.     Resp Syncytial Virus by PCR NEGATIVE NEGATIVE Final    Comment: (NOTE) Fact Sheet for Patients: EntrepreneurPulse.com.au  Fact Sheet for Healthcare Providers: IncredibleEmployment.be  This test is not yet approved or cleared by the [place] FDA and has been authorized for detection and/or diagnosis of SARS-CoV-2 by FDA under an Emergency Use Authorization (EUA). This EUA will remain in effect (meaning this test can be used) for the duration of the COVID-19 declaration under Section 564(b)(1) of the Act, 21 U.S.C. section 360bbb-3(b)(1), unless the authorization is terminated  or revoked.  Performed at [facility], [place] [address], [place], [place] [address]   Culture, blood (Routine X 2) w Reflex to ID Panel     Status: None (Preliminary result)   Collection Time: 12/13/22  8:55 PM   Specimen: BLOOD RIGHT ARM  Result Value Ref Range Status   Specimen Description BLOOD RIGHT ARM  Final   Special Requests   Final    BOTTLES DRAWN AEROBIC ONLY Blood Culture results may not be optimal due to an inadequate volume of blood received in culture bottles   Culture   Final    NO GROWTH 3 DAYS Performed at [facility], [place] [address], [place], [place] [address]    Report Status PENDING  Incomplete  Culture, blood (Routine X 2) w Reflex to ID Panel     Status: None (Preliminary result)   Collection Time: 12/13/22  9:15 PM   Specimen: BLOOD LEFT ARM  Result Value Ref Range Status   Specimen Description BLOOD LEFT ARM  Final   Special Requests   Final    BOTTLES DRAWN AEROBIC AND ANAEROBIC Blood Culture results may not be optimal due to an inadequate volume of blood received in culture bottles   Culture   Final    NO GROWTH 3 DAYS Performed at [facility], [place] [address] [place], [place] [address]    Report Status PENDING  Incomplete  Culture, blood (Routine X 2) w Reflex to ID Panel     Status: None (Preliminary result)   Collection Time: 12/14/22  3:55 AM   Specimen: BLOOD LEFT HAND  Result Value Ref Range Status   Specimen Description BLOOD LEFT HAND  Final   Special Requests   Final    BOTTLES DRAWN AEROBIC ONLY Blood Culture results may not be optimal due to an inadequate volume of blood received in culture bottles   Culture   Final    NO GROWTH 2 DAYS Performed at [facility], [place] [address], [place], [place] [address]    Report Status PENDING  Incomplete  Culture, blood (Routine X 2) w Reflex to ID Panel     Status: None (Preliminary result)  Collection Time: 12/14/22  4:08 AM   Specimen: BLOOD RIGHT HAND  Result Value Ref  Range Status   Specimen Description BLOOD RIGHT HAND  Final   Special Requests   Final    BOTTLES DRAWN AEROBIC ONLY Blood Culture results may not be optimal due to an inadequate volume of blood received in culture bottles   Culture   Final    NO GROWTH 2 DAYS Performed at [facility], [place] [address], [place], [place] [address]    Report Status PENDING  Incomplete         Radiology Studies: CT HEAD WO CONTRAST (5MM)  Result Date: 12/17/2022 CLINICAL DATA:  Follow-up examination for acute stroke. EXAM: CT HEAD WITHOUT CONTRAST TECHNIQUE: Contiguous axial images were obtained from the base of the skull through the vertex without intravenous contrast. RADIATION DOSE REDUCTION: This exam was performed according to the departmental dose-optimization program which includes automated exposure control, adjustment of the mA and/or kV according to patient size and/or use of iterative reconstruction technique. COMPARISON:  Prior brain MRI from 12/12/2022 as well as earlier studies. FINDINGS: Brain: Cerebral volume within normal limits. Underlying moderate chronic microvascular ischemic disease noted, stable. Chronic bilateral cerebellar infarcts noted, unchanged. Continue normal expected interval evolution of small subacute infarct involving the parasagittal left frontoparietal region. Previously identified small right occipital infarcts not visible. No other new visible acute large vessel territory infarct. No acute intracranial hemorrhage. No mass lesion or midline shift. No hydrocephalus or extra-axial fluid collection. Vascular: No abnormal hyperdense vessel. Calcified atherosclerosis present about the skull base. Skull: Scalp soft tissues and calvarium demonstrate no acute finding. Sinuses/Orbits: Globes and orbital soft tissues within normal limits. Paranasal sinuses and mastoid air cells are largely clear. Other: None. IMPRESSION: 1. Normal expected interval evolution of small subacute infarct  involving the parasagittal left frontoparietal region. No other new acute intracranial abnormality. 2. Underlying moderate chronic microvascular ischemic disease with chronic bilateral cerebellar infarcts, stable. Electronically Signed   By: Jeannine Boga M.D.   On: 12/17/2022 01:21   ECHO TEE  Result Date: 12/16/2022    TRANSESOPHOGEAL ECHO REPORT   Patient Name:   Heather [name] Date of Exam: 12/16/2022 Medical Rec #:  [id number]            Height:       69.0 in Accession #:    [id number]           Weight:       240.0 lb Date of Birth:  1971-02-16           BSA:          2.232 m Patient Age:    3 years             BP:           200/86 mmHg Patient Gender: F                    HR:           101 bpm. Exam Location:  Inpatient Procedure: 3D Echo, Transesophageal Echo, Cardiac Doppler and Color Doppler Indications:     Mitral valve mass.  History:         Patient has prior history of Echocardiogram examinations, most                  recent 12/13/2022. Abnormal ECG, Stroke, Mitral Valve Disease,  Arrythmias:Tachycardia and Cardiac Arrest,                  Signs/Symptoms:Dyspnea and Shortness of Breath; Risk                  Factors:Diabetes. ESRD.  Sonographer:     Roseanna Rainbow RDCS Referring Phys:  [id number] [place] Diagnosing Phys: Oswaldo Milian MD PROCEDURE: After discussion of the risks and benefits of a TEE, an informed consent was obtained from the patient. The transesophogeal probe was passed without difficulty through the esophogus of the patient. Imaged were obtained with the patient in a left lateral decubitus position. Sedation performed by different physician. The patient was monitored while under deep sedation. Anesthestetic sedation was provided intravenously by Anesthesiology: '227mg'$  of Propofol. The patient's vital signs; including heart rate, blood pressure, and oxygen saturation; remained stable throughout the procedure. The patient developed no  complications during the procedure.  IMPRESSIONS  1. 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 Very mobile, prolapses across mitral valve in diastole  2. Left ventricular ejection fraction, by estimation, is 60 to 65%. The left ventricle has normal function.  3. Right ventricular systolic function is normal. The right ventricular size is normal.  4. The mitral valve is degenerative. Mild mitral valve regurgitation. Moderate mitral annular calcification.  5. The aortic valve is tricuspid. Aortic valve regurgitation is not visualized. Aortic valve sclerosis/calcification is present, without any evidence of aortic stenosis.  6. No left atrial/left atrial appendage thrombus was detected.  7. Agitated saline contrast bubble study was positive with shunting observed within 3-6 cardiac cycles suggestive of interatrial shunt.  8. Moderate pericardial effusion. FINDINGS  Left Ventricle: Left ventricular ejection fraction, by estimation, is 60 to 65%. The left ventricle has normal function. The left ventricular internal cavity size was normal in size. Right Ventricle: The right ventricular size is normal. No increase in right ventricular wall thickness. Right ventricular systolic function is normal. Left Atrium: Left atrial size was normal in size. No left atrial/left atrial appendage thrombus was detected. Right Atrium: Right atrial size was normal in size. Pericardium: A moderately sized pericardial effusion is present. Mitral Valve: The mitral valve is degenerative in appearance. Moderate mitral annular calcification. Mild mitral valve regurgitation. Tricuspid Valve: The tricuspid valve is normal in structure. Tricuspid valve regurgitation is trivial. Aortic Valve: The aortic valve is tricuspid. Aortic valve regurgitation is not visualized. Aortic valve sclerosis/calcification is present, without any evidence of aortic stenosis. Aortic  valve mean gradient measures 4.0 mmHg. Aortic valve peak gradient measures 9.1 mmHg. Pulmonic Valve: The pulmonic valve was not well visualized. Pulmonic valve regurgitation is trivial. Aorta: The aortic root is normal in size and structure. IAS/Shunts: No atrial level shunt detected by color flow Doppler. Agitated saline contrast was given intravenously to evaluate for intracardiac shunting. Agitated saline contrast bubble study was positive with shunting observed within 3-6 cardiac cycles suggestive of interatrial shunt. Additional Comments: Spectral Doppler performed. AORTIC VALVE AV Vmax:      151.00 cm/s AV Vmean:     95.400 cm/s AV VTI:       0.278 m AV Peak Grad: 9.1 mmHg AV Mean Grad: 4.0 mmHg Oswaldo Milian MD Electronically signed by Oswaldo Milian MD Signature Date/Time: 12/16/2022/4:26:48 PM    Final         Scheduled Meds:  aspirin  81 mg Oral Daily   atorvastatin  80 mg Oral  QHS   Chlorhexidine Gluconate Cloth  6 each Topical Q0600   clopidogrel  75 mg Oral Daily   heparin  5,000 Units Subcutaneous Q8H   heparin sodium (porcine)       insulin aspart  0-15 Units Subcutaneous TID PC & HS   sodium chloride flush  10-40 mL Intracatheter Q12H   Continuous Infusions:  ceFEPime (MAXIPIME) IV Stopped (12/16/22 2030)   vancomycin            Aline August, MD [facility] 12/17/2022, 7:49 AM

## 2022-12-17 NOTE — Progress Notes (Signed)
Received pt from HD to 3W room#20. Per RN report, patient became altered/lethargic/aphasic during HD after receiving benadryl PO. Head CT completed, await results. Assessment done and is as documented.

## 2022-12-17 NOTE — Plan of Care (Signed)
  Problem: Education: Goal: Knowledge of disease or condition will improve Outcome: Progressing Goal: Knowledge of secondary prevention will improve (MUST DOCUMENT ALL) Outcome: Progressing Goal: Knowledge of patient specific risk factors will improve (Mark N/A or DELETE if not current risk factor) Outcome: Progressing   Problem: Ischemic Stroke/TIA Tissue Perfusion: Goal: Complications of ischemic stroke/TIA will be minimized Outcome: Progressing   Problem: Coping: Goal: Will verbalize positive feelings about self Outcome: Progressing Goal: Will identify appropriate support needs Outcome: Progressing   Problem: Health Behavior/Discharge Planning: Goal: Ability to manage health-related needs will improve Outcome: Progressing Goal: Goals will be collaboratively established with patient/family Outcome: Progressing   Problem: Self-Care: Goal: Ability to participate in self-care as condition permits will improve Outcome: Progressing Goal: Verbalization of feelings and concerns over difficulty with self-care will improve Outcome: Progressing Goal: Ability to communicate needs accurately will improve Outcome: Progressing   Problem: Nutrition: Goal: Risk of aspiration will decrease Outcome: Progressing Goal: Dietary intake will improve Outcome: Progressing   Problem: Education: Goal: Ability to describe self-care measures that may prevent or decrease complications (Diabetes Survival Skills Education) will improve Outcome: Progressing Goal: Individualized Educational Video(s) Outcome: Progressing   Problem: Coping: Goal: Ability to adjust to condition or change in health will improve Outcome: Progressing   Problem: Fluid Volume: Goal: Ability to maintain a balanced intake and output will improve Outcome: Progressing   Problem: Health Behavior/Discharge Planning: Goal: Ability to identify and utilize available resources and services will improve Outcome:  Progressing Goal: Ability to manage health-related needs will improve Outcome: Progressing   Problem: Metabolic: Goal: Ability to maintain appropriate glucose levels will improve Outcome: Progressing   Problem: Nutritional: Goal: Maintenance of adequate nutrition will improve Outcome: Progressing Goal: Progress toward achieving an optimal weight will improve Outcome: Progressing   Problem: Skin Integrity: Goal: Risk for impaired skin integrity will decrease Outcome: Progressing   Problem: Tissue Perfusion: Goal: Adequacy of tissue perfusion will improve Outcome: Progressing   Problem: Education: Goal: Knowledge of General Education information will improve Description: Including pain rating scale, medication(s)/side effects and non-pharmacologic comfort measures Outcome: Progressing   Problem: Health Behavior/Discharge Planning: Goal: Ability to manage health-related needs will improve Outcome: Progressing   Problem: Clinical Measurements: Goal: Ability to maintain clinical measurements within normal limits will improve Outcome: Progressing Goal: Will remain free from infection Outcome: Progressing Goal: Diagnostic test results will improve Outcome: Progressing Goal: Respiratory complications will improve Outcome: Progressing Goal: Cardiovascular complication will be avoided Outcome: Progressing   Problem: Activity: Goal: Risk for activity intolerance will decrease Outcome: Progressing   Problem: Nutrition: Goal: Adequate nutrition will be maintained Outcome: Progressing   Problem: Coping: Goal: Level of anxiety will decrease Outcome: Progressing   Problem: Elimination: Goal: Will not experience complications related to bowel motility Outcome: Progressing Goal: Will not experience complications related to urinary retention Outcome: Progressing   Problem: Pain Managment: Goal: General experience of comfort will improve Outcome: Progressing   Problem:  Safety: Goal: Ability to remain free from injury will improve Outcome: Progressing   Problem: Skin Integrity: Goal: Risk for impaired skin integrity will decrease Outcome: Progressing   

## 2022-12-17 NOTE — Progress Notes (Signed)
Physical Therapy Treatment Patient Details Name: Heather [name] MRN: [id number] DOB: 1971/02/06 Today's Date: 12/17/2022   History of Present Illness 51 year old admitted with new CVAs.  CT scan of the head with left paramedian frontal parietal region infarct.  MRI showed new 9 mm posterior left cerebral infarct.  Pt with PMH of cardiac arrest, stroke, ESRD on hemodialysis Monday Wednesday Friday, insulin-dependent type 2 diabetes with gastroparesis, PE and DVT on Eliquis, hypertension, right below-knee amputation who was brought from home with altered mental status and aphasia.  Hospitalization 10/21/2022-10/24/2022 with acute infarct left paramedian frontoparietal region.    PT Comments    Received pt long sitting in bed. Pt upset and in argument with husband (initially present, then stepped out) and reported feeling depressed. Provided therapeutic listening and offered additional services, however pt declined. Per chart review, rapid response called in dialysis yesterday and pt still feeling extremely lethargic and with difficulty keeping eyes open. Engaged in the following bed level exercises with emphasis on strength/ROM to fatigue. Pt left in bed with all needs met and VSS. Continue to recommend SNF at this time. Acute PT to cont to follow.     Recommendations for follow up therapy are one component of a multi-disciplinary discharge planning process, led by the attending physician.  Recommendations may be updated based on patient status, additional functional criteria and insurance authorization.  Follow Up Recommendations  Skilled nursing-short term rehab (<3 hours/day) Can patient physically be transported by private vehicle: No   Assistance Recommended at Discharge Frequent or constant Supervision/Assistance  Patient can return home with the following Assist for transportation;Help with stairs or ramp for entrance;Two people to help with walking and/or transfers;Direct  supervision/assist for medications management;Direct supervision/assist for financial management;Assistance with cooking/housework   Equipment Recommendations  Other (comment) (TBD)    Recommendations for Other Services       Precautions / Restrictions Precautions Precautions: Fall Precaution Comments: R BKA Restrictions Weight Bearing Restrictions: No     Mobility  Bed Mobility               General bed mobility comments: Transitioned from long sitting<>semi-reclined without assist to work on exercises Patient Response: Flat affect  Transfers                   General transfer comment: did not transfer OOB due to lethargy    Ambulation/Gait                   Stairs             Wheelchair Mobility    Modified Rankin (Stroke Patients Only)       Balance                                            Cognition Arousal/Alertness: Lethargic Behavior During Therapy:  (depressed) Overall Cognitive Status: Impaired/Different from baseline                       Memory: Decreased short-term memory Following Commands: Follows one step commands with increased time, Follows multi-step commands with increased time Safety/Judgement: Decreased awareness of deficits   Problem Solving: Slow processing, Decreased initiation, Difficulty sequencing, Requires verbal cues General Comments: Upon entering pt upset with husband (initally present and then left room) and verbalized feeling depressed. Provided therapist listening and emotional support and offerred  chaplain services, however pt declined. Pt's eyes heavy throughout session with pt falling in and out of sleep mid-conversation.        Exercises General Exercises - Lower Extremity Hip ABduction/ADduction: AROM, Right, Left, 5 reps Straight Leg Raises: AROM, Right, Left, 5 reps Hip Flexion/Marching: AROM, Left, 5 reps Other Exercises Other Exercises: hip adduction  pillow squeezes x10    General Comments        Pertinent Vitals/Pain Pain Assessment Pain Assessment: No/denies pain    Home Living                          Prior Function            PT Goals (current goals can now be found in the care plan section) Acute Rehab PT Goals Patient Stated Goal: unable to state PT Goal Formulation: With family Time For Goal Achievement: 12/28/22 Potential to Achieve Goals: Fair    Frequency    Min 2X/week      PT Plan Current plan remains appropriate    Co-evaluation              AM-PAC PT "6 Clicks" Mobility   Outcome Measure                   End of Session   Activity Tolerance: Patient limited by lethargy Patient left: in bed;with call bell/phone within reach;with bed alarm set Nurse Communication: Mobility status PT Visit Diagnosis: Muscle weakness (generalized) (M62.81);Difficulty in walking, not elsewhere classified (R26.2);Other symptoms and signs involving the nervous system (R29.898)     Time: 9211-9417 PT Time Calculation (min) (ACUTE ONLY): 15 min  Charges:  $Therapeutic Exercise: 8-22 mins                     Becky Sax PT, DPT  Blenda Nicely 12/17/2022, 12:18 PM

## 2022-12-18 DIAGNOSIS — I330 Acute and subacute infective endocarditis: Secondary | ICD-10-CM | POA: Diagnosis not present

## 2022-12-18 DIAGNOSIS — I639 Cerebral infarction, unspecified: Secondary | ICD-10-CM | POA: Diagnosis not present

## 2022-12-18 LAB — GLUCOSE, CAPILLARY
Glucose-Capillary: 133 mg/dL — ABNORMAL HIGH (ref 70–99)
Glucose-Capillary: 127 mg/dL — ABNORMAL HIGH (ref 70–99)
Glucose-Capillary: 157 mg/dL — ABNORMAL HIGH (ref 70–99)
Glucose-Capillary: 150 mg/dL — ABNORMAL HIGH (ref 70–99)
Glucose-Capillary: 192 mg/dL — ABNORMAL HIGH (ref 70–99)

## 2022-12-18 LAB — CBC WITH DIFFERENTIAL/PLATELET
Abs Immature Granulocytes: 0.03 10*3/uL (ref 0.00–0.07)
Basophils Absolute: 0 10*3/uL (ref 0.0–0.1)
Basophils Relative: 0 %
Eosinophils Absolute: 0.4 10*3/uL (ref 0.0–0.5)
Eosinophils Relative: 7 %
HCT: 30 % — ABNORMAL LOW (ref 36.0–46.0)
Hemoglobin: 9.7 g/dL — ABNORMAL LOW (ref 12.0–15.0)
Immature Granulocytes: 1 %
Lymphocytes Relative: 27 %
Lymphs Abs: 1.6 10*3/uL (ref 0.7–4.0)
MCH: 30.4 pg (ref 26.0–34.0)
MCHC: 32.3 g/dL (ref 30.0–36.0)
MCV: 94 fL (ref 80.0–100.0)
Monocytes Absolute: 0.6 10*3/uL (ref 0.1–1.0)
Monocytes Relative: 9 %
Neutro Abs: 3.4 10*3/uL (ref 1.7–7.7)
Neutrophils Relative %: 56 %
Platelets: 147 10*3/uL — ABNORMAL LOW (ref 150–400)
RBC: 3.19 MIL/uL — ABNORMAL LOW (ref 3.87–5.11)
RDW: 15 % (ref 11.5–15.5)
WBC: 6.1 10*3/uL (ref 4.0–10.5)
nRBC: 0.3 % — ABNORMAL HIGH (ref 0.0–0.2)

## 2022-12-18 LAB — RENAL FUNCTION PANEL
Albumin: 3.1 g/dL — ABNORMAL LOW (ref 3.5–5.0)
Anion gap: 17 — ABNORMAL HIGH (ref 5–15)
BUN: 41 mg/dL — ABNORMAL HIGH (ref 6–20)
CO2: 23 mmol/L (ref 22–32)
Calcium: 9.4 mg/dL (ref 8.9–10.3)
Chloride: 96 mmol/L — ABNORMAL LOW (ref 98–111)
Creatinine, Ser: 10.1 mg/dL — ABNORMAL HIGH (ref 0.44–1.00)
GFR, Estimated: 4 mL/min — ABNORMAL LOW (ref >60)
Glucose, Bld: 147 mg/dL — ABNORMAL HIGH (ref 70–99)
Phosphorus: 7.2 mg/dL — ABNORMAL HIGH (ref 2.5–4.6)
Potassium: 3.9 mmol/L (ref 3.5–5.1)
Sodium: 136 mmol/L (ref 135–145)

## 2022-12-18 LAB — ANA W/REFLEX IF POSITIVE: Anti Nuclear Antibody (ANA): NEGATIVE

## 2022-12-18 LAB — CULTURE, BLOOD (ROUTINE X 2)
Culture: NO GROWTH
Culture: NO GROWTH

## 2022-12-18 LAB — ANTI-DNA ANTIBODY, DOUBLE-STRANDED: ds DNA Ab: 1 IU/mL (ref 0–9)

## 2022-12-18 LAB — MAGNESIUM: Magnesium: 2 mg/dL (ref 1.7–2.4)

## 2022-12-18 NOTE — Plan of Care (Signed)
  Problem: Education: Goal: Knowledge of disease or condition will improve Outcome: Progressing   Problem: Ischemic Stroke/TIA Tissue Perfusion: Goal: Complications of ischemic stroke/TIA will be minimized Outcome: Progressing   Problem: Coping: Goal: Will verbalize positive feelings about self Outcome: Progressing   Problem: Health Behavior/Discharge Planning: Goal: Ability to manage health-related needs will improve Outcome: Progressing   Problem: Self-Care: Goal: Ability to participate in self-care as condition permits will improve Outcome: Progressing Goal: Verbalization of feelings and concerns over difficulty with self-care will improve Outcome: Progressing Goal: Ability to communicate needs accurately will improve Outcome: Progressing

## 2022-12-18 NOTE — Progress Notes (Signed)
Patient ID: Heather [name], female   DOB: 02-04-1971, 51 y.o.   MRN: [id number] S: No events overnight.  O:BP 123/63 (BP Location: Right Arm)   Pulse 88   Temp 98.3 F (36.8 C) (Oral)   Resp 16   Ht '5\' 9"'$  (1.753 m)   Wt 98.8 kg Comment: bed scale  LMP 09/10/2021   SpO2 96%   BMI 32.17 kg/m   Intake/Output Summary (Last 24 hours) at 12/18/2022 0950 Last data filed at 12/18/2022 0000 Gross per 24 hour  Intake 820 ml  Output 0 ml  Net 820 ml   Intake/Output: I/O last 3 completed shifts: In: 2297 [P.O.:1560; IV Piggyback:200] Out: 1200 [Other:1200]  Intake/Output this shift:  No intake/output data recorded. Weight change:  Gen: NAD CVS: RRR Resp:CTA Abd: +BS, soft, NT/ND Ext: no edema, s/p RBKA  Recent Labs  Lab 12/12/22 1356 12/12/22 1419 12/14/22 0354 12/14/22 1923 12/16/22 0500 12/17/22 0642 12/18/22 0258  NA 137 137 140 134* 133* 135 136  K 3.9 4.3 4.2 4.2 4.7 3.9 3.9  CL 98 101 99 96* 96* 92* 96*  CO2 27  --  '23 25 23 26 23  '$ GLUCOSE 187* 191* 145* 98 99 107* 147*  BUN 39* 40* 52* 24* 47* 30* 41*  CREATININE 10.31* 10.90* 12.83* 7.24* 10.84* 8.09* 10.10*  ALBUMIN 3.4*  --  3.1* 3.1* 3.2* 3.1* 3.1*  CALCIUM 9.5  --  8.9 8.8* 9.1 9.6 9.4  PHOS  --   --  7.1* 4.8* 7.7* 5.0* 7.2*  AST 24  --   --   --   --   --   --   ALT 61*  --   --   --   --   --   --    Liver Function Tests: Recent Labs  Lab 12/12/22 1356 12/14/22 0354 12/16/22 0500 12/17/22 0642 12/18/22 0258  AST 24  --   --   --   --   ALT 61*  --   --   --   --   ALKPHOS 108  --   --   --   --   BILITOT 0.4  --   --   --   --   PROT 7.9  --   --   --   --   ALBUMIN 3.4*   < > 3.2* 3.1* 3.1*   < > = values in this interval not displayed.   No results for input(s): "LIPASE", "AMYLASE" in the last 168 hours. Recent Labs  Lab 12/12/22 1356  AMMONIA 24   CBC: Recent Labs  Lab 12/14/22 0354 12/15/22 0403 12/16/22 0500 12/17/22 0642 12/18/22 0258  WBC 6.5 6.5 7.6 8.0 6.1   NEUTROABS  --  3.9 4.9 5.4 3.4  HGB 10.4* 11.3* 10.5* 10.2* 9.7*  HCT 33.0* 34.4* 32.5* 31.2* 30.0*  MCV 96.2 92.0 93.7 93.4 94.0  PLT 130* 131* 150 158 147*   Cardiac Enzymes: No results for input(s): "CKTOTAL", "CKMB", "CKMBINDEX", "TROPONINI" in the last 168 hours. CBG: Recent Labs  Lab 12/17/22 1227 12/17/22 1716 12/17/22 2132 12/18/22 0612 12/18/22 0817  GLUCAP 135* 141* 132* 133* 127*    Iron Studies: No results for input(s): "IRON", "TIBC", "TRANSFERRIN", "FERRITIN" in the last 72 hours. Studies/Results: CT HEAD WO CONTRAST (5MM)  Result Date: 12/17/2022 CLINICAL DATA:  Follow-up examination for acute stroke. EXAM: CT HEAD WITHOUT CONTRAST TECHNIQUE: Contiguous axial images were obtained from the base of the skull through the vertex without  intravenous contrast. RADIATION DOSE REDUCTION: This exam was performed according to the departmental dose-optimization program which includes automated exposure control, adjustment of the mA and/or kV according to patient size and/or use of iterative reconstruction technique. COMPARISON:  Prior brain MRI from 12/12/2022 as well as earlier studies. FINDINGS: Brain: Cerebral volume within normal limits. Underlying moderate chronic microvascular ischemic disease noted, stable. Chronic bilateral cerebellar infarcts noted, unchanged. Continue normal expected interval evolution of small subacute infarct involving the parasagittal left frontoparietal region. Previously identified small right occipital infarcts not visible. No other new visible acute large vessel territory infarct. No acute intracranial hemorrhage. No mass lesion or midline shift. No hydrocephalus or extra-axial fluid collection. Vascular: No abnormal hyperdense vessel. Calcified atherosclerosis present about the skull base. Skull: Scalp soft tissues and calvarium demonstrate no acute finding. Sinuses/Orbits: Globes and orbital soft tissues within normal limits. Paranasal sinuses and  mastoid air cells are largely clear. Other: None. IMPRESSION: 1. Normal expected interval evolution of small subacute infarct involving the parasagittal left frontoparietal region. No other new acute intracranial abnormality. 2. Underlying moderate chronic microvascular ischemic disease with chronic bilateral cerebellar infarcts, stable. Electronically Signed   By: Jeannine Boga M.D.   On: 12/17/2022 01:21   ECHO TEE  Result Date: 12/16/2022    TRANSESOPHOGEAL ECHO REPORT   Patient Name:   Heather [name] Date of Exam: 12/16/2022 Medical Rec #:  [id number]            Height:       69.0 in Accession #:    [id number]           Weight:       240.0 lb Date of Birth:  1971/03/15           BSA:          2.232 m Patient Age:    55 years             BP:           200/86 mmHg Patient Gender: F                    HR:           101 bpm. Exam Location:  Inpatient Procedure: 3D Echo, Transesophageal Echo, Cardiac Doppler and Color Doppler Indications:     Mitral valve mass.  History:         Patient has prior history of Echocardiogram examinations, most                  recent 12/13/2022. Abnormal ECG, Stroke, Mitral Valve Disease,                  Arrythmias:Tachycardia and Cardiac Arrest,                  Signs/Symptoms:Dyspnea and Shortness of Breath; Risk                  Factors:Diabetes. ESRD.  Sonographer:     Roseanna Rainbow RDCS Referring Phys:  [id number] [place] Diagnosing Phys: Oswaldo Milian MD PROCEDURE: After discussion of the risks and benefits of a TEE, an informed consent was obtained from the patient. The transesophogeal probe was passed without difficulty through the esophogus of the patient. Imaged were obtained with the patient in a left lateral decubitus position. Sedation performed by different physician. The patient was monitored while under deep sedation. Anesthestetic sedation was provided intravenously by Anesthesiology: '227mg'$  of Propofol. The patient's vital signs;  including heart rate, blood pressure, and oxygen saturation; remained stable throughout the procedure. The patient developed no complications during the procedure.  IMPRESSIONS  1. 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 Very mobile, prolapses across mitral valve in diastole  2. Left ventricular ejection fraction, by estimation, is 60 to 65%. The left ventricle has normal function.  3. Right ventricular systolic function is normal. The right ventricular size is normal.  4. The mitral valve is degenerative. Mild mitral valve regurgitation. Moderate mitral annular calcification.  5. The aortic valve is tricuspid. Aortic valve regurgitation is not visualized. Aortic valve sclerosis/calcification is present, without any evidence of aortic stenosis.  6. No left atrial/left atrial appendage thrombus was detected.  7. Agitated saline contrast bubble study was positive with shunting observed within 3-6 cardiac cycles suggestive of interatrial shunt.  8. Moderate pericardial effusion. FINDINGS  Left Ventricle: Left ventricular ejection fraction, by estimation, is 60 to 65%. The left ventricle has normal function. The left ventricular internal cavity size was normal in size. Right Ventricle: The right ventricular size is normal. No increase in right ventricular wall thickness. Right ventricular systolic function is normal. Left Atrium: Left atrial size was normal in size. No left atrial/left atrial appendage thrombus was detected. Right Atrium: Right atrial size was normal in size. Pericardium: A moderately sized pericardial effusion is present. Mitral Valve: The mitral valve is degenerative in appearance. Moderate mitral annular calcification. Mild mitral valve regurgitation. Tricuspid Valve: The tricuspid valve is normal in structure. Tricuspid valve regurgitation is trivial. Aortic Valve: The aortic valve is tricuspid. Aortic valve  regurgitation is not visualized. Aortic valve sclerosis/calcification is present, without any evidence of aortic stenosis. Aortic valve mean gradient measures 4.0 mmHg. Aortic valve peak gradient measures 9.1 mmHg. Pulmonic Valve: The pulmonic valve was not well visualized. Pulmonic valve regurgitation is trivial. Aorta: The aortic root is normal in size and structure. IAS/Shunts: No atrial level shunt detected by color flow Doppler. Agitated saline contrast was given intravenously to evaluate for intracardiac shunting. Agitated saline contrast bubble study was positive with shunting observed within 3-6 cardiac cycles suggestive of interatrial shunt. Additional Comments: Spectral Doppler performed. AORTIC VALVE AV Vmax:      151.00 cm/s AV Vmean:     95.400 cm/s AV VTI:       0.278 m AV Peak Grad: 9.1 mmHg AV Mean Grad: 4.0 mmHg Oswaldo Milian MD Electronically signed by Oswaldo Milian MD Signature Date/Time: 12/16/2022/4:26:48 PM    Final     aspirin  81 mg Oral Daily   atorvastatin  80 mg Oral QHS   calcium acetate  667 mg Oral TID WC   Chlorhexidine Gluconate Cloth  6 each Topical Q0600   clopidogrel  75 mg Oral Daily   heparin  5,000 Units Subcutaneous Q8H   insulin aspart  0-15 Units Subcutaneous TID PC & HS   sodium chloride flush  10-40 mL Intracatheter Q12H    BMET    Component Value Date/Time   NA 136 12/18/2022 0258   NA 133 (A) 03/20/2019 0000   K 3.9 12/18/2022 0258   K 4.3 10/16/2013 0000   CL 96 (L) 12/18/2022 0258   CL 100 10/16/2013 0000   CO2 23 12/18/2022 0258   GLUCOSE 147 (H) 12/18/2022 0258   BUN 41 (H) 12/18/2022 0258   BUN 41 (A) 03/20/2019 0000   CREATININE 10.10 (H) 12/18/2022 0258   CREATININE 3.05 (H)  09/03/2015 0855   CALCIUM 9.4 12/18/2022 0258   CALCIUM 8.9 10/16/2013 0000   GFRNONAA 4 (L) 12/18/2022 0258   GFRNONAA 18 (L) 09/03/2015 0855   GFRAA 4 (L) 05/07/2020 0819   GFRAA 20 (L) 09/03/2015 0855   CBC    Component Value Date/Time   WBC  6.1 12/18/2022 0258   RBC 3.19 (L) 12/18/2022 0258   HGB 9.7 (L) 12/18/2022 0258   HCT 30.0 (L) 12/18/2022 0258   PLT 147 (L) 12/18/2022 0258   MCV 94.0 12/18/2022 0258   MCH 30.4 12/18/2022 0258   MCHC 32.3 12/18/2022 0258   RDW 15.0 12/18/2022 0258   LYMPHSABS 1.6 12/18/2022 0258   MONOABS 0.6 12/18/2022 0258   EOSABS 0.4 12/18/2022 0258   BASOSABS 0.0 12/18/2022 0258   Dialysis Orders:  Center: [facility]  on MWF . 180NRe 3 hr 45 min BFR 400 DFR 700 EDW 102.3kg 2K 2 Ca TDC Heparin 7000 unit bolus Venoefr '50mg'$  weekly Gectorol 82mg IV q HD  Assessment/Plan:  New embolic stroke: neurology following. Recent endocarditis. HD center did have to use ceftaz at one point due to cefepime shortage but appears she otherwise completed her IV abx. Repeat echo with mass on posterior mitral valve. 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  ID and CT surgery following as well.  Pt and husband want to consider surgery and asked to speak with CT surgery again today. Acute encephalopathy: new embolic stroke on MRI but also took baclofen which is contraindicated in ESRD. Stop baclofen and continue with HD.  Thankfully she has woken up and is speaking.  ESRD:  On MWF schedule, completed HD earlier today then get back on schedule Sunday (holiday schedule for MWF patients)   Hypertension/volume: Bp significantly elevated on arrival. Neurology recommended normotensive BP goal. UF with HD as tolerated today.   Anemia: Hgb above goal, no ESA indicated at this time.   Metabolic bone disease: Continue binders (calcium acetate 3 caps per meal). Continue VDRA  Nutrition:  Currently NPO T2DM: management per primary team  JDonetta Potts MD [facility]

## 2022-12-18 NOTE — Progress Notes (Signed)
Pt requested to speak with Dr. Kipp Brood, about possible surgery, and he is not available today.  Regis Bill, [place] and she said she and Dr. Kipp Brood would visit Tuesday.

## 2022-12-18 NOTE — Progress Notes (Signed)
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  MRI brain on admission was positive for new possible embolic right occipital infarcts when compared to October.  Neurology was consulted.  She has had issues with encephalopathy, aphasia and dysphagia which is slowly improving.  Nephrology consulted to continue dialysis.  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  Cardiology was consulted for TEE and ID was consulted.  She underwent TEE on 12/16/2022  Assessment & Plan:   Acute ischemic right occipital infarcts, possibly embolic/Acute metabolic encephalopathy -Presented with encephalopathy, aphasia and was nonverbal and initially failed SLP eval -MRI brain showed new right occipital ischemic infarcts possibly from the valvular mass/vegetation.  Neurology recommended aspirin and Plavix for 3 weeks then aspirin alone. -A1c 7.4.  LDL 135.  Continue Lipitor. -EEG negative for seizures.  LTM EEG negative for seizures.   Mental status has significantly improved.  Patient seen again by neurology.  No changes. SNF recommended by PT OT.    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   There is a note from Dr. Kipp Brood with cardiothoracic surgery on 12/22.  According to the note there is no plan for  surgical intervention.  Patient and husband wants to speak to him again. -Currently on cefepime and vancomycin as per ID recommendations  End-stage renal disease on hemodialysis -Nephrology following.  Dialysis as per nephrology schedule  History of PVD status post right BKA -Currently wheelchair-bound  Anemia of chronic disease -From renal failure.  Hemoglobin stable.  Monitor intermittently.  Hyponatremia Resolved  Thrombocytopenia -Questionable cause.  Resolved  Diabetes mellitus type 2 with hyperglycemia -A1c 7.9.  Continue CBGs with SSI  Goals of care -Palliative care consultation appreciated.  Currently remains full code.  DVT prophylaxis: Heparin subcutaneous Code Status: Full Family Communication: Husband at bedside Disposition Plan: SNF when medically stable   Status is: Inpatient Remains inpatient appropriate because: Of severity of illness    Consultants: Nephrology/neurology/cardiothoracic surgery/ID.  Palliative care  Procedures: As above  Antimicrobials:  Anti-infectives (From admission, onward)    Start     Dose/Rate Route Frequency Ordered Stop   12/17/22 0900  vancomycin (VANCOREADY) IVPB 500 mg/100 mL  Status:  Discontinued        500 mg 100 mL/hr over 60 Minutes Intravenous  Once 12/17/22 0812 12/17/22 0816   12/16/22 1200  vancomycin (VANCOCIN) IVPB 1000 mg/200 mL premix  Status:  Discontinued        1,000 mg 200 mL/hr over 60 Minutes Intravenous  Once 12/15/22 1228 12/17/22 0812   12/15/22 1200  vancomycin (VANCOCIN) IVPB 1000 mg/200 mL premix  Status:  Discontinued        1,000 mg 200 mL/hr over 60 Minutes Intravenous Every M-W-F (Hemodialysis) 12/13/22 1823 12/15/22 1228   12/14/22 2200  ceFEPIme (MAXIPIME) 1 g in sodium chloride 0.9 % 100 mL IVPB  1 g 200 mL/hr over 30 Minutes Intravenous Every 24 hours 12/14/22 1322     12/14/22 1500  vancomycin (VANCOCIN) IVPB 1000 mg/200 mL premix        1,000 mg 200 mL/hr over 60 Minutes  Intravenous  Once 12/14/22 1329 12/14/22 1532   12/13/22 2200  piperacillin-tazobactam (ZOSYN) IVPB 2.25 g  Status:  Discontinued        2.25 g 100 mL/hr over 30 Minutes Intravenous Every 8 hours 12/13/22 1820 12/14/22 1322   12/13/22 1830  vancomycin (VANCOCIN) 2,250 mg in sodium chloride 0.9 % 500 mL IVPB        2,250 mg 261.3 mL/hr over 120 Minutes Intravenous  Once 12/13/22 1820 12/14/22 0115        Subjective: Denies any complaints this morning.  Able to move all 4 extremities.  No shortness of breath.  No nausea or vomiting.   Objective: Vitals:   12/18/22 0200 12/18/22 0317 12/18/22 0400 12/18/22 0820  BP:  123/63    Pulse: 95 89  88  Resp: '18 16 16   '$ Temp:  98.3 F (36.8 C)  98.3 F (36.8 C)  TempSrc:  Oral  Oral  SpO2:  96%    Weight:      Height:        Intake/Output Summary (Last 24 hours) at 12/18/2022 1036 Last data filed at 12/18/2022 0000 Gross per 24 hour  Intake 820 ml  Output 0 ml  Net 820 ml    Filed Weights   12/12/22 1306 12/16/22 1321 12/16/22 2152  Weight: 103 kg 108.9 kg 98.8 kg    Examination:  General appearance: Awake alert.  In no distress Resp: Clear to auscultation bilaterally.  Normal effort Cardio: S1-S2 is normal regular.  No S3-S4.  No rubs murmurs or bruit GI: Abdomen is soft.  Nontender nondistended.  Bowel sounds are present normal.  No masses organomegaly Extremities: No edema.     Data Reviewed: I have personally reviewed following labs and imaging studies  CBC: Recent Labs  Lab 12/12/22 1356 12/12/22 1419 12/14/22 0354 12/15/22 0403 12/16/22 0500 12/17/22 0642 12/18/22 0258  WBC 6.6  --  6.5 6.5 7.6 8.0 6.1  NEUTROABS 4.1  --   --  3.9 4.9 5.4 3.4  HGB 10.4*   < > 10.4* 11.3* 10.5* 10.2* 9.7*  HCT 34.2*   < > 33.0* 34.4* 32.5* 31.2* 30.0*  MCV 97.7  --  96.2 92.0 93.7 93.4 94.0  PLT 133*  --  130* 131* 150 158 147*   < > = values in this interval not displayed.    Basic Metabolic Panel: Recent Labs   Lab 12/14/22 0354 12/14/22 1923 12/15/22 0403 12/16/22 0500 12/17/22 0642 12/18/22 0258  NA 140 134*  --  133* 135 136  K 4.2 4.2  --  4.7 3.9 3.9  CL 99 96*  --  96* 92* 96*  CO2 23 25  --  '23 26 23  '$ GLUCOSE 145* 98  --  99 107* 147*  BUN 52* 24*  --  47* 30* 41*  CREATININE 12.83* 7.24*  --  10.84* 8.09* 10.10*  CALCIUM 8.9 8.8*  --  9.1 9.6 9.4  MG  --   --  2.0 2.3 2.1 2.0  PHOS 7.1* 4.8*  --  7.7* 5.0* 7.2*    GFR: Estimated Creatinine Clearance: 8.1 mL/min (A) (by C-G formula based on SCr of 10.1 mg/dL (H)). Liver Function Tests: Recent Labs  Lab 12/12/22 1356  12/14/22 0354 12/14/22 1923 12/16/22 0500 12/17/22 0642 12/18/22 0258  AST 24  --   --   --   --   --   ALT 61*  --   --   --   --   --   ALKPHOS 108  --   --   --   --   --   BILITOT 0.4  --   --   --   --   --   PROT 7.9  --   --   --   --   --   ALBUMIN 3.4* 3.1* 3.1* 3.2* 3.1* 3.1*    Recent Labs  Lab 12/12/22 1356  AMMONIA 24     CBG: Recent Labs  Lab 12/17/22 1227 12/17/22 1716 12/17/22 2132 12/18/22 0612 12/18/22 0817  GLUCAP 135* 141* 132* 133* 127*      Recent Results (from the past 240 hour(s))  Resp panel by RT-PCR (RSV, Flu A&B, Covid) Anterior Nasal Swab     Status: None   Collection Time: 12/12/22  1:57 PM   Specimen: Anterior Nasal Swab  Result Value Ref Range Status   SARS Coronavirus 2 by RT PCR NEGATIVE NEGATIVE Final    Comment: (NOTE) SARS-CoV-2 target nucleic acids are NOT DETECTED.  The SARS-CoV-2 RNA is generally detectable in upper respiratory specimens during the acute phase of infection. The lowest concentration of SARS-CoV-2 viral copies this assay can detect is 138 copies/mL. A negative result does not preclude SARS-Cov-2 infection and should not be used as the sole basis for treatment or other patient management decisions. A negative result may occur with  improper specimen collection/handling, submission of specimen other than nasopharyngeal swab,  presence of viral mutation(s) within the areas targeted by this assay, and inadequate number of viral copies(<138 copies/mL). A negative result must be combined with clinical observations, patient history, and epidemiological information. The expected result is Negative.  Fact Sheet for Patients:  EntrepreneurPulse.com.au  Fact Sheet for Healthcare Providers:  IncredibleEmployment.be  This test is no t yet approved or cleared by the [place] FDA and  has been authorized for detection and/or diagnosis of SARS-CoV-2 by FDA under an Emergency Use Authorization (EUA). This EUA will remain  in effect (meaning this test can be used) for the duration of the COVID-19 declaration under Section 564(b)(1) of the Act, 21 U.S.C.section 360bbb-3(b)(1), unless the authorization is terminated  or revoked sooner.       Influenza A by PCR NEGATIVE NEGATIVE Final   Influenza B by PCR NEGATIVE NEGATIVE Final    Comment: (NOTE) The Xpert Xpress SARS-CoV-2/FLU/RSV plus assay is intended as an aid in the diagnosis of influenza from Nasopharyngeal swab specimens and should not be used as a sole basis for treatment. Nasal washings and aspirates are unacceptable for Xpert Xpress SARS-CoV-2/FLU/RSV testing.  Fact Sheet for Patients: EntrepreneurPulse.com.au  Fact Sheet for Healthcare Providers: IncredibleEmployment.be  This test is not yet approved or cleared by the [place] FDA and has been authorized for detection and/or diagnosis of SARS-CoV-2 by FDA under an Emergency Use Authorization (EUA). This EUA will remain in effect (meaning this test can be used) for the duration of the COVID-19 declaration under Section 564(b)(1) of the Act, 21 U.S.C. section 360bbb-3(b)(1), unless the authorization is terminated or revoked.     Resp Syncytial Virus by PCR NEGATIVE NEGATIVE Final    Comment: (NOTE) Fact Sheet for  Patients: EntrepreneurPulse.com.au  Fact Sheet for Healthcare Providers: IncredibleEmployment.be  This test is  not yet approved or cleared by the [place] and has been authorized for detection and/or diagnosis of SARS-CoV-2 by FDA under an Emergency Use Authorization (EUA). This EUA will remain in effect (meaning this test can be used) for the duration of the COVID-19 declaration under Section 564(b)(1) of the Act, 21 U.S.C. section 360bbb-3(b)(1), unless the authorization is terminated or revoked.  Performed at [facility], [place] [address], [place], [place] [address]   Culture, blood (Routine X 2) w Reflex to ID Panel     Status: None   Collection Time: 12/13/22  8:55 PM   Specimen: BLOOD RIGHT ARM  Result Value Ref Range Status   Specimen Description BLOOD RIGHT ARM  Final   Special Requests   Final    BOTTLES DRAWN AEROBIC ONLY Blood Culture results may not be optimal due to an inadequate volume of blood received in culture bottles   Culture   Final    NO GROWTH 5 DAYS Performed at [facility], [place] [address] [place], [place] [address]    Report Status 12/18/2022 FINAL  Final  Culture, blood (Routine X 2) w Reflex to ID Panel     Status: None   Collection Time: 12/13/22  9:15 PM   Specimen: BLOOD LEFT ARM  Result Value Ref Range Status   Specimen Description BLOOD LEFT ARM  Final   Special Requests   Final    BOTTLES DRAWN AEROBIC AND ANAEROBIC Blood Culture results may not be optimal due to an inadequate volume of blood received in culture bottles   Culture   Final    NO GROWTH 5 DAYS Performed at [facility], [place] [address], [place], [place] [address]    Report Status 12/18/2022 FINAL  Final  Culture, blood (Routine X 2) w Reflex to ID Panel     Status: None (Preliminary result)   Collection Time: 12/14/22  3:55 AM   Specimen: BLOOD LEFT HAND  Result Value Ref Range Status   Specimen Description BLOOD  LEFT HAND  Final   Special Requests   Final    BOTTLES DRAWN AEROBIC ONLY Blood Culture results may not be optimal due to an inadequate volume of blood received in culture bottles   Culture   Final    NO GROWTH 4 DAYS Performed at [facility], [place] [address] [place], [place] [address]    Report Status PENDING  Incomplete  Culture, blood (Routine X 2) w Reflex to ID Panel     Status: None (Preliminary result)   Collection Time: 12/14/22  4:08 AM   Specimen: BLOOD RIGHT HAND  Result Value Ref Range Status   Specimen Description BLOOD RIGHT HAND  Final   Special Requests   Final    BOTTLES DRAWN AEROBIC ONLY Blood Culture results may not be optimal due to an inadequate volume of blood received in culture bottles   Culture   Final    NO GROWTH 4 DAYS Performed at [facility], [place] [address] [place], [place] [address]    Report Status PENDING  Incomplete         Radiology Studies: CT HEAD WO CONTRAST (5MM)  Result Date: 12/17/2022 CLINICAL DATA:  Follow-up examination for acute stroke. EXAM: CT HEAD WITHOUT CONTRAST TECHNIQUE: Contiguous axial images were obtained from the base of the skull through the vertex without intravenous contrast. RADIATION DOSE REDUCTION: This exam was performed according to the departmental dose-optimization program which includes  automated exposure control, adjustment of the mA and/or kV according to patient size and/or use of iterative reconstruction technique. COMPARISON:  Prior brain MRI from 12/12/2022 as well as earlier studies. FINDINGS: Brain: Cerebral volume within normal limits. Underlying moderate chronic microvascular ischemic disease noted, stable. Chronic bilateral cerebellar infarcts noted, unchanged. Continue normal expected interval evolution of small subacute infarct involving the parasagittal left frontoparietal region. Previously identified small right occipital infarcts not visible. No other new visible acute large vessel  territory infarct. No acute intracranial hemorrhage. No mass lesion or midline shift. No hydrocephalus or extra-axial fluid collection. Vascular: No abnormal hyperdense vessel. Calcified atherosclerosis present about the skull base. Skull: Scalp soft tissues and calvarium demonstrate no acute finding. Sinuses/Orbits: Globes and orbital soft tissues within normal limits. Paranasal sinuses and mastoid air cells are largely clear. Other: None. IMPRESSION: 1. Normal expected interval evolution of small subacute infarct involving the parasagittal left frontoparietal region. No other new acute intracranial abnormality. 2. Underlying moderate chronic microvascular ischemic disease with chronic bilateral cerebellar infarcts, stable. Electronically Signed   By: Jeannine Boga M.D.   On: 12/17/2022 01:21   ECHO TEE  Result Date: 12/16/2022    TRANSESOPHOGEAL ECHO REPORT   Patient Name:   Heather [name] Date of Exam: 12/16/2022 Medical Rec #:  [id number]            Height:       69.0 in Accession #:    [id number]           Weight:       240.0 lb Date of Birth:  July 07, 1971           BSA:          2.232 m Patient Age:    83 years             BP:           200/86 mmHg Patient Gender: F                    HR:           101 bpm. Exam Location:  Inpatient Procedure: 3D Echo, Transesophageal Echo, Cardiac Doppler and Color Doppler Indications:     Mitral valve mass.  History:         Patient has prior history of Echocardiogram examinations, most                  recent 12/13/2022. Abnormal ECG, Stroke, Mitral Valve Disease,                  Arrythmias:Tachycardia and Cardiac Arrest,                  Signs/Symptoms:Dyspnea and Shortness of Breath; Risk                  Factors:Diabetes. ESRD.  Sonographer:     Roseanna Rainbow RDCS Referring Phys:  [id number] [place] Diagnosing Phys: Oswaldo Milian MD PROCEDURE: After discussion of the risks and benefits of a TEE, an informed consent was obtained from the  patient. The transesophogeal probe was passed without difficulty through the esophogus of the patient. Imaged were obtained with the patient in a left lateral decubitus position. Sedation performed by different physician. The patient was monitored while under deep sedation. Anesthestetic sedation was provided intravenously by Anesthesiology: '227mg'$  of Propofol. The patient's vital signs; including heart rate, blood pressure, and oxygen saturation; remained stable throughout the procedure. The patient developed  no complications during the procedure.  IMPRESSIONS  1. 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 Very mobile, prolapses across mitral valve in diastole  2. Left ventricular ejection fraction, by estimation, is 60 to 65%. The left ventricle has normal function.  3. Right ventricular systolic function is normal. The right ventricular size is normal.  4. The mitral valve is degenerative. Mild mitral valve regurgitation. Moderate mitral annular calcification.  5. The aortic valve is tricuspid. Aortic valve regurgitation is not visualized. Aortic valve sclerosis/calcification is present, without any evidence of aortic stenosis.  6. No left atrial/left atrial appendage thrombus was detected.  7. Agitated saline contrast bubble study was positive with shunting observed within 3-6 cardiac cycles suggestive of interatrial shunt.  8. Moderate pericardial effusion. FINDINGS  Left Ventricle: Left ventricular ejection fraction, by estimation, is 60 to 65%. The left ventricle has normal function. The left ventricular internal cavity size was normal in size. Right Ventricle: The right ventricular size is normal. No increase in right ventricular wall thickness. Right ventricular systolic function is normal. Left Atrium: Left atrial size was normal in size. No left atrial/left atrial appendage thrombus was detected. Right Atrium: Right atrial  size was normal in size. Pericardium: A moderately sized pericardial effusion is present. Mitral Valve: The mitral valve is degenerative in appearance. Moderate mitral annular calcification. Mild mitral valve regurgitation. Tricuspid Valve: The tricuspid valve is normal in structure. Tricuspid valve regurgitation is trivial. Aortic Valve: The aortic valve is tricuspid. Aortic valve regurgitation is not visualized. Aortic valve sclerosis/calcification is present, without any evidence of aortic stenosis. Aortic valve mean gradient measures 4.0 mmHg. Aortic valve peak gradient measures 9.1 mmHg. Pulmonic Valve: The pulmonic valve was not well visualized. Pulmonic valve regurgitation is trivial. Aorta: The aortic root is normal in size and structure. IAS/Shunts: No atrial level shunt detected by color flow Doppler. Agitated saline contrast was given intravenously to evaluate for intracardiac shunting. Agitated saline contrast bubble study was positive with shunting observed within 3-6 cardiac cycles suggestive of interatrial shunt. Additional Comments: Spectral Doppler performed. AORTIC VALVE AV Vmax:      151.00 cm/s AV Vmean:     95.400 cm/s AV VTI:       0.278 m AV Peak Grad: 9.1 mmHg AV Mean Grad: 4.0 mmHg Oswaldo Milian MD Electronically signed by Oswaldo Milian MD Signature Date/Time: 12/16/2022/4:26:48 PM    Final      Scheduled Meds:  aspirin  81 mg Oral Daily   atorvastatin  80 mg Oral QHS   calcium acetate  667 mg Oral TID WC   Chlorhexidine Gluconate Cloth  6 each Topical Q0600   clopidogrel  75 mg Oral Daily   heparin  5,000 Units Subcutaneous Q8H   insulin aspart  0-15 Units Subcutaneous TID PC & HS   sodium chloride flush  10-40 mL Intracatheter Q12H   Continuous Infusions:  ceFEPime (MAXIPIME) IV Stopped (12/17/22 2302)      Bonnielee Haff, MD [facility] 12/18/2022, 10:36 AM

## 2022-12-18 NOTE — Plan of Care (Signed)
  Problem: Education: Goal: Knowledge of disease or condition will improve Outcome: Progressing   Problem: Coping: Goal: Will verbalize positive feelings about self Outcome: Progressing   Problem: Health Behavior/Discharge Planning: Goal: Ability to manage health-related needs will improve Outcome: Progressing   Problem: Self-Care: Goal: Ability to participate in self-care as condition permits will improve Outcome: Progressing Goal: Verbalization of feelings and concerns over difficulty with self-care will improve Outcome: Progressing Goal: Ability to communicate needs accurately will improve Outcome: Progressing

## 2022-12-19 DIAGNOSIS — I330 Acute and subacute infective endocarditis: Secondary | ICD-10-CM | POA: Diagnosis not present

## 2022-12-19 DIAGNOSIS — I639 Cerebral infarction, unspecified: Secondary | ICD-10-CM | POA: Diagnosis not present

## 2022-12-19 LAB — CBC WITH DIFFERENTIAL/PLATELET
Abs Immature Granulocytes: 0.02 10*3/uL (ref 0.00–0.07)
Basophils Absolute: 0 10*3/uL (ref 0.0–0.1)
Basophils Relative: 0 %
Eosinophils Absolute: 0.5 10*3/uL (ref 0.0–0.5)
Eosinophils Relative: 7 %
HCT: 30.3 % — ABNORMAL LOW (ref 36.0–46.0)
Hemoglobin: 9.4 g/dL — ABNORMAL LOW (ref 12.0–15.0)
Immature Granulocytes: 0 %
Lymphocytes Relative: 32 %
Lymphs Abs: 2.2 10*3/uL (ref 0.7–4.0)
MCH: 29.7 pg (ref 26.0–34.0)
MCHC: 31 g/dL (ref 30.0–36.0)
MCV: 95.9 fL (ref 80.0–100.0)
Monocytes Absolute: 0.7 10*3/uL (ref 0.1–1.0)
Monocytes Relative: 10 %
Neutro Abs: 3.5 10*3/uL (ref 1.7–7.7)
Neutrophils Relative %: 51 %
Platelets: 167 10*3/uL (ref 150–400)
RBC: 3.16 MIL/uL — ABNORMAL LOW (ref 3.87–5.11)
RDW: 15.1 % (ref 11.5–15.5)
WBC: 6.9 10*3/uL (ref 4.0–10.5)
nRBC: 0.3 % — ABNORMAL HIGH (ref 0.0–0.2)

## 2022-12-19 LAB — RENAL FUNCTION PANEL
Albumin: 2.9 g/dL — ABNORMAL LOW (ref 3.5–5.0)
Anion gap: 12 (ref 5–15)
BUN: 52 mg/dL — ABNORMAL HIGH (ref 6–20)
CO2: 26 mmol/L (ref 22–32)
Calcium: 9 mg/dL (ref 8.9–10.3)
Chloride: 97 mmol/L — ABNORMAL LOW (ref 98–111)
Creatinine, Ser: 12.17 mg/dL — ABNORMAL HIGH (ref 0.44–1.00)
GFR, Estimated: 3 mL/min — ABNORMAL LOW (ref >60)
Glucose, Bld: 139 mg/dL — ABNORMAL HIGH (ref 70–99)
Phosphorus: 7.6 mg/dL — ABNORMAL HIGH (ref 2.5–4.6)
Potassium: 4.1 mmol/L (ref 3.5–5.1)
Sodium: 135 mmol/L (ref 135–145)

## 2022-12-19 LAB — CULTURE, BLOOD (ROUTINE X 2)
Culture: NO GROWTH
Culture: NO GROWTH

## 2022-12-19 LAB — GLUCOSE, CAPILLARY
Glucose-Capillary: 173 mg/dL — ABNORMAL HIGH (ref 70–99)
Glucose-Capillary: 177 mg/dL — ABNORMAL HIGH (ref 70–99)
Glucose-Capillary: 161 mg/dL — ABNORMAL HIGH (ref 70–99)
Glucose-Capillary: 203 mg/dL — ABNORMAL HIGH (ref 70–99)
Glucose-Capillary: 173 mg/dL — ABNORMAL HIGH (ref 70–99)

## 2022-12-19 LAB — MAGNESIUM: Magnesium: 2.2 mg/dL (ref 1.7–2.4)

## 2022-12-19 MED ORDER — LIP MEDEX EX OINT
TOPICAL_OINTMENT | CUTANEOUS | Status: DC | PRN
  Filled 2022-12-19: qty 7

## 2022-12-19 MED ORDER — HEPARIN SODIUM (PORCINE) 1000 UNIT/ML IJ SOLN
INTRAMUSCULAR | Status: AC
  Administered 2022-12-19: 1000 [IU]
  Filled 2022-12-19: qty 4

## 2022-12-19 MED ORDER — VANCOMYCIN HCL IN DEXTROSE 1-5 GM/200ML-% IV SOLN
1000.0000 mg | Freq: Once | INTRAVENOUS | Status: AC
  Administered 2022-12-19: 1000 mg via INTRAVENOUS
  Filled 2022-12-19: qty 200

## 2022-12-19 NOTE — Plan of Care (Signed)
  Problem: Education: Goal: Knowledge of disease or condition will improve Outcome: Progressing Goal: Knowledge of secondary prevention will improve (MUST DOCUMENT ALL) Outcome: Progressing Goal: Knowledge of patient specific risk factors will improve (Mark N/A or DELETE if not current risk factor) Outcome: Progressing   Problem: Ischemic Stroke/TIA Tissue Perfusion: Goal: Complications of ischemic stroke/TIA will be minimized Outcome: Progressing   Problem: Coping: Goal: Will verbalize positive feelings about self Outcome: Progressing Goal: Will identify appropriate support needs Outcome: Progressing   Problem: Health Behavior/Discharge Planning: Goal: Ability to manage health-related needs will improve Outcome: Progressing Goal: Goals will be collaboratively established with patient/family Outcome: Progressing   Problem: Self-Care: Goal: Ability to participate in self-care as condition permits will improve Outcome: Progressing Goal: Verbalization of feelings and concerns over difficulty with self-care will improve Outcome: Progressing Goal: Ability to communicate needs accurately will improve Outcome: Progressing   Problem: Nutrition: Goal: Risk of aspiration will decrease Outcome: Progressing Goal: Dietary intake will improve Outcome: Progressing   Problem: Education: Goal: Ability to describe self-care measures that may prevent or decrease complications (Diabetes Survival Skills Education) will improve Outcome: Progressing Goal: Individualized Educational Video(s) Outcome: Progressing   Problem: Coping: Goal: Ability to adjust to condition or change in health will improve Outcome: Progressing   Problem: Fluid Volume: Goal: Ability to maintain a balanced intake and output will improve Outcome: Progressing   Problem: Health Behavior/Discharge Planning: Goal: Ability to identify and utilize available resources and services will improve Outcome:  Progressing Goal: Ability to manage health-related needs will improve Outcome: Progressing   Problem: Metabolic: Goal: Ability to maintain appropriate glucose levels will improve Outcome: Progressing   Problem: Nutritional: Goal: Maintenance of adequate nutrition will improve Outcome: Progressing Goal: Progress toward achieving an optimal weight will improve Outcome: Progressing   Problem: Skin Integrity: Goal: Risk for impaired skin integrity will decrease Outcome: Progressing   Problem: Tissue Perfusion: Goal: Adequacy of tissue perfusion will improve Outcome: Progressing   Problem: Education: Goal: Knowledge of General Education information will improve Description: Including pain rating scale, medication(s)/side effects and non-pharmacologic comfort measures Outcome: Progressing   Problem: Health Behavior/Discharge Planning: Goal: Ability to manage health-related needs will improve Outcome: Progressing   Problem: Clinical Measurements: Goal: Ability to maintain clinical measurements within normal limits will improve Outcome: Progressing Goal: Will remain free from infection Outcome: Progressing Goal: Diagnostic test results will improve Outcome: Progressing Goal: Respiratory complications will improve Outcome: Progressing Goal: Cardiovascular complication will be avoided Outcome: Progressing   Problem: Activity: Goal: Risk for activity intolerance will decrease Outcome: Progressing   Problem: Nutrition: Goal: Adequate nutrition will be maintained Outcome: Progressing   Problem: Coping: Goal: Level of anxiety will decrease Outcome: Progressing   Problem: Elimination: Goal: Will not experience complications related to bowel motility Outcome: Progressing Goal: Will not experience complications related to urinary retention Outcome: Progressing   Problem: Pain Managment: Goal: General experience of comfort will improve Outcome: Progressing   Problem:  Safety: Goal: Ability to remain free from injury will improve Outcome: Progressing   Problem: Skin Integrity: Goal: Risk for impaired skin integrity will decrease Outcome: Progressing   

## 2022-12-19 NOTE — Progress Notes (Signed)
Patient taken to HD at this time via transport.  CCMD notified of patient travel.  No s/s of distress noted denies any pain.  Report was given to Delorise Jackson, RN in HD.

## 2022-12-19 NOTE — Progress Notes (Signed)
   12/19/22 1654  Pain Assessment  Pain Scale 0-10  Pain Score 0  Neurological  Level of Consciousness Alert  Orientation Level Oriented X4  Respiratory  Respiratory Pattern Regular;Unlabored  Chest Assessment Chest expansion symmetrical  Bilateral Breath Sounds Clear;Diminished  Cardiac  ECG Monitor Yes  Cardiac Rhythm NSR   Received patient in bed to unit.  Alert and oriented.  Informed consent signed and in chart.   Treatment initiated: 1306p Treatment completed: 1651p  Patient tolerated well.  Transported back to the room  Alert, without acute distress.  Hand-off given to patient's nurse.   Access used: Yes Access issues: No  Total UF removed: 2000 Medication(s) given: Vancomycin IV Post HD VS: 97.9, 109/59, 88, 16, Spo2 96 on RA Post HD weight: 99.9   [facility]

## 2022-12-19 NOTE — Progress Notes (Signed)
Patient ID: Heather [name], female   DOB: 03/30/1971, 51 y.o.   MRN: [id number] S: Feels well, no complaints O:BP 122/66 (BP Location: Left Arm)   Pulse 85   Temp 98 F (36.7 C) (Oral)   Resp 16   Ht '5\' 9"'$  (1.753 m)   Wt 98.8 kg Comment: bed scale  LMP 09/10/2021   SpO2 99%   BMI 32.17 kg/m   Intake/Output Summary (Last 24 hours) at 12/19/2022 1002 Last data filed at 12/19/2022 0932 Gross per 24 hour  Intake 350 ml  Output 0 ml  Net 350 ml   Intake/Output: I/O last 3 completed shifts: In: 6712 [P.O.:960; I.V.:10; IV Piggyback:200] Out: 0   Intake/Output this shift:  No intake/output data recorded. Weight change:  Gen: NAD CVS: RRR Resp:CTA Abd: +BS, soft, NT/ND Ext: no edema, s/p RBKA  Recent Labs  Lab 12/12/22 1356 12/12/22 1419 12/14/22 0354 12/14/22 1923 12/16/22 0500 12/17/22 0642 12/18/22 0258 12/19/22 0257  NA 137 137 140 134* 133* 135 136 135  K 3.9 4.3 4.2 4.2 4.7 3.9 3.9 4.1  CL 98 101 99 96* 96* 92* 96* 97*  CO2 27  --  '23 25 23 26 23 26  '$ GLUCOSE 187* 191* 145* 98 99 107* 147* 139*  BUN 39* 40* 52* 24* 47* 30* 41* 52*  CREATININE 10.31* 10.90* 12.83* 7.24* 10.84* 8.09* 10.10* 12.17*  ALBUMIN 3.4*  --  3.1* 3.1* 3.2* 3.1* 3.1* 2.9*  CALCIUM 9.5  --  8.9 8.8* 9.1 9.6 9.4 9.0  PHOS  --   --  7.1* 4.8* 7.7* 5.0* 7.2* 7.6*  AST 24  --   --   --   --   --   --   --   ALT 61*  --   --   --   --   --   --   --    Liver Function Tests: Recent Labs  Lab 12/12/22 1356 12/14/22 0354 12/17/22 0642 12/18/22 0258 12/19/22 0257  AST 24  --   --   --   --   ALT 61*  --   --   --   --   ALKPHOS 108  --   --   --   --   BILITOT 0.4  --   --   --   --   PROT 7.9  --   --   --   --   ALBUMIN 3.4*   < > 3.1* 3.1* 2.9*   < > = values in this interval not displayed.   No results for input(s): "LIPASE", "AMYLASE" in the last 168 hours. Recent Labs  Lab 12/12/22 1356  AMMONIA 24   CBC: Recent Labs  Lab 12/15/22 0403 12/16/22 0500 12/17/22 0642  12/18/22 0258 12/19/22 0257  WBC 6.5 7.6 8.0 6.1 6.9  NEUTROABS 3.9 4.9 5.4 3.4 3.5  HGB 11.3* 10.5* 10.2* 9.7* 9.4*  HCT 34.4* 32.5* 31.2* 30.0* 30.3*  MCV 92.0 93.7 93.4 94.0 95.9  PLT 131* 150 158 147* 167   Cardiac Enzymes: No results for input(s): "CKTOTAL", "CKMB", "CKMBINDEX", "TROPONINI" in the last 168 hours. CBG: Recent Labs  Lab 12/18/22 1150 12/18/22 1634 12/18/22 2126 12/19/22 0639 12/19/22 0817  GLUCAP 157* 150* 192* 173* 177*    Iron Studies: No results for input(s): "IRON", "TIBC", "TRANSFERRIN", "FERRITIN" in the last 72 hours. Studies/Results: No results found.  aspirin  81 mg Oral Daily   atorvastatin  80 mg Oral QHS   calcium  acetate  667 mg Oral TID WC   Chlorhexidine Gluconate Cloth  6 each Topical Q0600   clopidogrel  75 mg Oral Daily   heparin  5,000 Units Subcutaneous Q8H   insulin aspart  0-15 Units Subcutaneous TID PC & HS   sodium chloride flush  10-40 mL Intracatheter Q12H    BMET    Component Value Date/Time   NA 135 12/19/2022 0257   NA 133 (A) 03/20/2019 0000   K 4.1 12/19/2022 0257   K 4.3 10/16/2013 0000   CL 97 (L) 12/19/2022 0257   CL 100 10/16/2013 0000   CO2 26 12/19/2022 0257   GLUCOSE 139 (H) 12/19/2022 0257   BUN 52 (H) 12/19/2022 0257   BUN 41 (A) 03/20/2019 0000   CREATININE 12.17 (H) 12/19/2022 0257   CREATININE 3.05 (H) 09/03/2015 0855   CALCIUM 9.0 12/19/2022 0257   CALCIUM 8.9 10/16/2013 0000   GFRNONAA 3 (L) 12/19/2022 0257   GFRNONAA 18 (L) 09/03/2015 0855   GFRAA 4 (L) 05/07/2020 0819   GFRAA 20 (L) 09/03/2015 0855   CBC    Component Value Date/Time   WBC 6.9 12/19/2022 0257   RBC 3.16 (L) 12/19/2022 0257   HGB 9.4 (L) 12/19/2022 0257   HCT 30.3 (L) 12/19/2022 0257   PLT 167 12/19/2022 0257   MCV 95.9 12/19/2022 0257   MCH 29.7 12/19/2022 0257   MCHC 31.0 12/19/2022 0257   RDW 15.1 12/19/2022 0257   LYMPHSABS 2.2 12/19/2022 0257   MONOABS 0.7 12/19/2022 0257   EOSABS 0.5 12/19/2022 0257    BASOSABS 0.0 12/19/2022 0257    Dialysis Orders:  Center: [facility]  on MWF . 180NRe 3 hr 45 min BFR 400 DFR 700 EDW 102.3kg 2K 2 Ca TDC Heparin 7000 unit bolus Venoefr '50mg'$  weekly Gectorol 62mg IV q HD   Assessment/Plan:  New embolic stroke: neurology following. Recent endocarditis. HD center did have to use ceftaz at one point due to cefepime shortage but appears she otherwise completed her IV abx. Repeat echo with mass on posterior mitral valve. 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  ID and CT surgery following as well.  Pt and husband want to consider surgery and asked to speak with CT surgery again which will take place 12/21/22. Acute encephalopathy: new embolic stroke on MRI but also took baclofen which is contraindicated in ESRD. Stop baclofen and continue with HD.  Thankfully she has woken up and is speaking.  ESRD:  On MWF schedule, will have HD today due to holiday schedule for MWF patients.  Next HD will be 12/22/22.   Hypertension/volume: Bp significantly elevated on arrival. Neurology recommended normotensive BP goal. UF with HD as tolerated today.   Anemia: Hgb above goal, no ESA indicated at this time.   Metabolic bone disease: Continue binders (calcium acetate 3 caps per meal). Continue VDRA  Nutrition:  Currently NPO T2DM: management per primary team  JDonetta Potts MD [facility]

## 2022-12-19 NOTE — Progress Notes (Signed)
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  MRI brain on admission was positive for new possible embolic right occipital infarcts when compared to October.  Neurology was consulted.  She has had issues with encephalopathy, aphasia and dysphagia which is slowly improving.  Nephrology consulted to continue dialysis.  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  Cardiology was consulted for TEE and ID was consulted.  She underwent TEE on 12/16/2022  Assessment & Plan:   Acute ischemic right occipital infarcts, possibly embolic/Acute metabolic encephalopathy -Presented with encephalopathy, aphasia and was nonverbal and initially failed SLP eval -MRI brain showed new right occipital ischemic infarcts possibly from the valvular mass/vegetation.   Neurology recommended aspirin and Plavix for 3 weeks then aspirin alone. -A1c 7.4.  LDL 135.  Continue Lipitor. -EEG negative for seizures.  LTM EEG negative for seizures.   Mental status has improved.  Continue to monitor. SNF recommended by PT OT.    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   There is a note from Dr. Kipp Brood with cardiothoracic surgery on 12/22.  According to the note there is no plan for surgical intervention.  Patient and  husband wants to speak to him again.  Cardiothoracic team will be back to discuss with patient on 12/26. Currently on cefepime and vancomycin as per ID.  End-stage renal disease on hemodialysis -Nephrology following.  Dialysis as per nephrology  History of PVD status post right BKA -Currently wheelchair-bound  Anemia of chronic disease Hemoglobin stable.  Monitor intermittently.  Hyponatremia Resolved  Thrombocytopenia -Questionable cause.  Resolved  Diabetes mellitus type 2 with hyperglycemia -A1c 7.9.  Continue CBGs with SSI  Goals of care -Palliative care consultation appreciated.  Currently remains full code.  DVT prophylaxis: Heparin subcutaneous Code Status: Full Family Communication: Husband at bedside Disposition Plan: SNF when medically stable   Status is: Inpatient Remains inpatient appropriate because: Of severity of illness    Consultants: Nephrology/neurology/cardiothoracic surgery/ID.  Palliative care  Procedures: As above  Antimicrobials:  Anti-infectives (From admission, onward)    Start     Dose/Rate Route Frequency Ordered Stop   12/19/22 1200  vancomycin (VANCOCIN) IVPB 1000 mg/200 mL premix        1,000 mg 200 mL/hr over 60 Minutes Intravenous Once in dialysis 12/19/22 0828     12/17/22 0900  vancomycin (VANCOREADY) IVPB 500 mg/100 mL  Status:  Discontinued        500 mg 100 mL/hr over 60 Minutes Intravenous  Once 12/17/22 0812 12/17/22 0816   12/16/22 1200  vancomycin (VANCOCIN) IVPB 1000 mg/200 mL premix  Status:  Discontinued        1,000 mg 200 mL/hr over 60 Minutes Intravenous  Once 12/15/22 1228 12/17/22 0812   12/15/22 1200  vancomycin (VANCOCIN) IVPB 1000 mg/200 mL premix  Status:  Discontinued  1,000 mg 200 mL/hr over 60 Minutes Intravenous Every M-W-F (Hemodialysis) 12/13/22 1823 12/15/22 1228   12/14/22 2200  ceFEPIme (MAXIPIME) 1 g in sodium chloride 0.9 % 100 mL IVPB        1 g 200 mL/hr over 30 Minutes Intravenous Every  24 hours 12/14/22 1322     12/14/22 1500  vancomycin (VANCOCIN) IVPB 1000 mg/200 mL premix        1,000 mg 200 mL/hr over 60 Minutes Intravenous  Once 12/14/22 1329 12/14/22 1532   12/13/22 2200  piperacillin-tazobactam (ZOSYN) IVPB 2.25 g  Status:  Discontinued        2.25 g 100 mL/hr over 30 Minutes Intravenous Every 8 hours 12/13/22 1820 12/14/22 1322   12/13/22 1830  vancomycin (VANCOCIN) 2,250 mg in sodium chloride 0.9 % 500 mL IVPB        2,250 mg 261.3 mL/hr over 120 Minutes Intravenous  Once 12/13/22 1820 12/14/22 0115        Subjective: Patient denies any complaints.  Able to move her extremities.  Denies any chest pain shortness of breath.  No nausea or vomiting.   Objective: Vitals:   12/18/22 1554 12/18/22 2013 12/19/22 0007 12/19/22 0407  BP: (!) 105/56 (!) 113/55 (!) 102/53 122/66  Pulse: 87 87 87 85  Resp: '10 11 18 16  '$ Temp: 97.7 F (36.5 C) 97.9 F (36.6 C) 97.7 F (36.5 C) 98 F (36.7 C)  TempSrc: Oral Oral Oral Oral  SpO2: 97% 100% 100% 99%  Weight:      Height:        Intake/Output Summary (Last 24 hours) at 12/19/2022 1022 Last data filed at 12/19/2022 0815 Gross per 24 hour  Intake 470 ml  Output 0 ml  Net 470 ml    Filed Weights   12/12/22 1306 12/16/22 1321 12/16/22 2152  Weight: 103 kg 108.9 kg 98.8 kg    Examination:  General appearance: Awake alert.  In no distress Resp: Clear to auscultation bilaterally.  Normal effort Cardio: S1-S2 is normal regular.  No S3-S4.  No rubs murmurs or bruit GI: Abdomen is soft.  Nontender nondistended.  Bowel sounds are present normal.  No masses organomegaly Extremities: No edema.      Data Reviewed: I have personally reviewed following labs and imaging studies  CBC: Recent Labs  Lab 12/15/22 0403 12/16/22 0500 12/17/22 0642 12/18/22 0258 12/19/22 0257  WBC 6.5 7.6 8.0 6.1 6.9  NEUTROABS 3.9 4.9 5.4 3.4 3.5  HGB 11.3* 10.5* 10.2* 9.7* 9.4*  HCT 34.4* 32.5* 31.2* 30.0* 30.3*  MCV 92.0  93.7 93.4 94.0 95.9  PLT 131* 150 158 147* 027    Basic Metabolic Panel: Recent Labs  Lab 12/14/22 1923 12/15/22 0403 12/16/22 0500 12/17/22 0642 12/18/22 0258 12/19/22 0257  NA 134*  --  133* 135 136 135  K 4.2  --  4.7 3.9 3.9 4.1  CL 96*  --  96* 92* 96* 97*  CO2 25  --  '23 26 23 26  '$ GLUCOSE 98  --  99 107* 147* 139*  BUN 24*  --  47* 30* 41* 52*  CREATININE 7.24*  --  10.84* 8.09* 10.10* 12.17*  CALCIUM 8.8*  --  9.1 9.6 9.4 9.0  MG  --  2.0 2.3 2.1 2.0 2.2  PHOS 4.8*  --  7.7* 5.0* 7.2* 7.6*    GFR: Estimated Creatinine Clearance: 6.7 mL/min (A) (by C-G formula based on SCr of 12.17 mg/dL (H)). Liver Function Tests: Recent  Labs  Lab 12/12/22 1356 12/14/22 0354 12/14/22 1923 12/16/22 0500 12/17/22 0642 12/18/22 0258 12/19/22 0257  AST 24  --   --   --   --   --   --   ALT 61*  --   --   --   --   --   --   ALKPHOS 108  --   --   --   --   --   --   BILITOT 0.4  --   --   --   --   --   --   PROT 7.9  --   --   --   --   --   --   ALBUMIN 3.4*   < > 3.1* 3.2* 3.1* 3.1* 2.9*   < > = values in this interval not displayed.    Recent Labs  Lab 12/12/22 1356  AMMONIA 24     CBG: Recent Labs  Lab 12/18/22 1150 12/18/22 1634 12/18/22 2126 12/19/22 0639 12/19/22 0817  GLUCAP 157* 150* 192* 173* 177*      Recent Results (from the past 240 hour(s))  Resp panel by RT-PCR (RSV, Flu A&B, Covid) Anterior Nasal Swab     Status: None   Collection Time: 12/12/22  1:57 PM   Specimen: Anterior Nasal Swab  Result Value Ref Range Status   SARS Coronavirus 2 by RT PCR NEGATIVE NEGATIVE Final    Comment: (NOTE) SARS-CoV-2 target nucleic acids are NOT DETECTED.  The SARS-CoV-2 RNA is generally detectable in upper respiratory specimens during the acute phase of infection. The lowest concentration of SARS-CoV-2 viral copies this assay can detect is 138 copies/mL. A negative result does not preclude SARS-Cov-2 infection and should not be used as the sole basis  for treatment or other patient management decisions. A negative result may occur with  improper specimen collection/handling, submission of specimen other than nasopharyngeal swab, presence of viral mutation(s) within the areas targeted by this assay, and inadequate number of viral copies(<138 copies/mL). A negative result must be combined with clinical observations, patient history, and epidemiological information. The expected result is Negative.  Fact Sheet for Patients:  EntrepreneurPulse.com.au  Fact Sheet for Healthcare Providers:  IncredibleEmployment.be  This test is no t yet approved or cleared by the [place] FDA and  has been authorized for detection and/or diagnosis of SARS-CoV-2 by FDA under an Emergency Use Authorization (EUA). This EUA will remain  in effect (meaning this test can be used) for the duration of the COVID-19 declaration under Section 564(b)(1) of the Act, 21 U.S.C.section 360bbb-3(b)(1), unless the authorization is terminated  or revoked sooner.       Influenza A by PCR NEGATIVE NEGATIVE Final   Influenza B by PCR NEGATIVE NEGATIVE Final    Comment: (NOTE) The Xpert Xpress SARS-CoV-2/FLU/RSV plus assay is intended as an aid in the diagnosis of influenza from Nasopharyngeal swab specimens and should not be used as a sole basis for treatment. Nasal washings and aspirates are unacceptable for Xpert Xpress SARS-CoV-2/FLU/RSV testing.  Fact Sheet for Patients: EntrepreneurPulse.com.au  Fact Sheet for Healthcare Providers: IncredibleEmployment.be  This test is not yet approved or cleared by the [place] FDA and has been authorized for detection and/or diagnosis of SARS-CoV-2 by FDA under an Emergency Use Authorization (EUA). This EUA will remain in effect (meaning this test can be used) for the duration of the COVID-19 declaration under Section 564(b)(1) of the Act, 21  U.S.C. section 360bbb-3(b)(1), unless the authorization  is terminated or revoked.     Resp Syncytial Virus by PCR NEGATIVE NEGATIVE Final    Comment: (NOTE) Fact Sheet for Patients: EntrepreneurPulse.com.au  Fact Sheet for Healthcare Providers: IncredibleEmployment.be  This test is not yet approved or cleared by the [place] FDA and has been authorized for detection and/or diagnosis of SARS-CoV-2 by FDA under an Emergency Use Authorization (EUA). This EUA will remain in effect (meaning this test can be used) for the duration of the COVID-19 declaration under Section 564(b)(1) of the Act, 21 U.S.C. section 360bbb-3(b)(1), unless the authorization is terminated or revoked.  Performed at [facility], [place] [address], [place], [place] [address]   Culture, blood (Routine X 2) w Reflex to ID Panel     Status: None   Collection Time: 12/13/22  8:55 PM   Specimen: BLOOD RIGHT ARM  Result Value Ref Range Status   Specimen Description BLOOD RIGHT ARM  Final   Special Requests   Final    BOTTLES DRAWN AEROBIC ONLY Blood Culture results may not be optimal due to an inadequate volume of blood received in culture bottles   Culture   Final    NO GROWTH 5 DAYS Performed at [facility], [place] [address], [place], [place] [address]    Report Status 12/18/2022 FINAL  Final  Culture, blood (Routine X 2) w Reflex to ID Panel     Status: None   Collection Time: 12/13/22  9:15 PM   Specimen: BLOOD LEFT ARM  Result Value Ref Range Status   Specimen Description BLOOD LEFT ARM  Final   Special Requests   Final    BOTTLES DRAWN AEROBIC AND ANAEROBIC Blood Culture results may not be optimal due to an inadequate volume of blood received in culture bottles   Culture   Final    NO GROWTH 5 DAYS Performed at [facility], [place] [address] [place], [place] [address]    Report Status 12/18/2022 FINAL  Final  Culture, blood (Routine X 2) w Reflex  to ID Panel     Status: None   Collection Time: 12/14/22  3:55 AM   Specimen: BLOOD LEFT HAND  Result Value Ref Range Status   Specimen Description BLOOD LEFT HAND  Final   Special Requests   Final    BOTTLES DRAWN AEROBIC ONLY Blood Culture results may not be optimal due to an inadequate volume of blood received in culture bottles   Culture   Final    NO GROWTH 5 DAYS Performed at [facility], [place] [address], [place], [place] [address]    Report Status 12/19/2022 FINAL  Final  Culture, blood (Routine X 2) w Reflex to ID Panel     Status: None   Collection Time: 12/14/22  4:08 AM   Specimen: BLOOD RIGHT HAND  Result Value Ref Range Status   Specimen Description BLOOD RIGHT HAND  Final   Special Requests   Final    BOTTLES DRAWN AEROBIC ONLY Blood Culture results may not be optimal due to an inadequate volume of blood received in culture bottles   Culture   Final    NO GROWTH 5 DAYS Performed at [facility], [place] [address] [place], [place] [address]    Report Status 12/19/2022 FINAL  Final         Radiology Studies: No results found.   Scheduled Meds:  aspirin  81 mg Oral Daily   atorvastatin  80 mg  Oral QHS   calcium acetate  667 mg Oral TID WC   Chlorhexidine Gluconate Cloth  6 each Topical Q0600   clopidogrel  75 mg Oral Daily   heparin  5,000 Units Subcutaneous Q8H   insulin aspart  0-15 Units Subcutaneous TID PC & HS   sodium chloride flush  10-40 mL Intracatheter Q12H   Continuous Infusions:  ceFEPime (MAXIPIME) IV Stopped (12/18/22 2144)   vancomycin        Bonnielee Haff, MD [facility] 12/19/2022, 10:22 AM

## 2022-12-20 ENCOUNTER — Encounter (HOSPITAL_COMMUNITY): Payer: Self-pay | Admitting: Cardiology

## 2022-12-20 DIAGNOSIS — I639 Cerebral infarction, unspecified: Secondary | ICD-10-CM | POA: Diagnosis not present

## 2022-12-20 DIAGNOSIS — I330 Acute and subacute infective endocarditis: Secondary | ICD-10-CM | POA: Diagnosis not present

## 2022-12-20 LAB — GLUCOSE, CAPILLARY
Glucose-Capillary: 181 mg/dL — ABNORMAL HIGH (ref 70–99)
Glucose-Capillary: 184 mg/dL — ABNORMAL HIGH (ref 70–99)
Glucose-Capillary: 221 mg/dL — ABNORMAL HIGH (ref 70–99)
Glucose-Capillary: 118 mg/dL — ABNORMAL HIGH (ref 70–99)
Glucose-Capillary: 183 mg/dL — ABNORMAL HIGH (ref 70–99)

## 2022-12-20 LAB — RENAL FUNCTION PANEL
Albumin: 2.8 g/dL — ABNORMAL LOW (ref 3.5–5.0)
Anion gap: 16 — ABNORMAL HIGH (ref 5–15)
BUN: 30 mg/dL — ABNORMAL HIGH (ref 6–20)
CO2: 21 mmol/L — ABNORMAL LOW (ref 22–32)
Calcium: 8.9 mg/dL (ref 8.9–10.3)
Chloride: 97 mmol/L — ABNORMAL LOW (ref 98–111)
Creatinine, Ser: 7.35 mg/dL — ABNORMAL HIGH (ref 0.44–1.00)
GFR, Estimated: 6 mL/min — ABNORMAL LOW (ref >60)
Glucose, Bld: 166 mg/dL — ABNORMAL HIGH (ref 70–99)
Phosphorus: 4.8 mg/dL — ABNORMAL HIGH (ref 2.5–4.6)
Potassium: 4.5 mmol/L (ref 3.5–5.1)
Sodium: 134 mmol/L — ABNORMAL LOW (ref 135–145)

## 2022-12-20 MED ORDER — DOXERCALCIFEROL 4 MCG/2ML IV SOLN
4.0000 ug | INTRAVENOUS | Status: DC
  Administered 2022-12-22: 4 ug via INTRAVENOUS
  Filled 2022-12-20 (×3): qty 2

## 2022-12-20 MED ORDER — SODIUM CHLORIDE 0.9 % IV SOLN: INTRAVENOUS | Status: DC

## 2022-12-20 MED ORDER — SODIUM CHLORIDE 0.9 % IV BOLUS
500.0000 mL | Freq: Once | INTRAVENOUS | Status: AC
  Administered 2022-12-20: 500 mL via INTRAVENOUS

## 2022-12-20 NOTE — Progress Notes (Signed)
Pharmacy Antibiotic Note  Heather [name] is a 51 y.o. 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  Pharmacy has been consulted for Vancomycin + Cefepime dosing due to concern for persistent culture negative IE.   ESRD-MWF however received HD off-schedule yesterday (12/24) per holiday schedule. Anticipated next HD is Wednesday 12/27. Patient received Vancomycin 1,000 mg at end of HD session 12/24. Documented length of HD session 3.5h at BFR 400.   Of note, patient had previously opted not to receive CTS intervention, but is now considering surgery and asking to speak with Dr. Kipp Brood again.   Plan: - Vancomycin 1,000 mg IV QHD  - Follow up HD schedule- likely 12/27  - Cefepime 1g IV every 24 hours for now - Will continue to follow HD schedule/duration, culture results, LOT, and antibiotic de-escalation plans  Height: '5\' 9"'$  (175.3 cm) Weight: 99.9 kg (220 lb 3.8 oz) IBW/kg (Calculated) : 66.2  Temp (24hrs), Avg:98.2 F (36.8 C), Min:97.9 F (36.6 C), Max:98.4 F (36.9 C)  Recent Labs  Lab 12/15/22 0403 12/16/22 0500 12/17/22 0642 12/17/22 0931 12/18/22 0258 12/19/22 0257 12/20/22 0327  WBC 6.5 7.6 8.0  --  6.1 6.9  --   CREATININE  --  10.84* 8.09*  --  10.10* 12.17* 7.35*  VANCORANDOM  --   --   --  31  --   --   --      Estimated Creatinine Clearance: 11.1 mL/min (A) (by C-G formula based on SCr of 7.35 mg/dL (H)).    Allergies  Allergen Reactions   Ibuprofen Other (See Comments)    CKD stage 3. Should avoid.   Dilaudid [Hydromorphone Hcl] Itching and Other (See Comments)    Can take with Benadryl.   Iodinated Contrast Media Rash   Tramadol Itching    Antimicrobials this admission: Vanc 12/18 >> Zosyn 12/19 x 1 Cefepime 12/19 >>  Dose adjustments this admission: N/a  Microbiology results: 12/17 COVID/flu >> neg 12/18 BCx >> negF 12/19 BCx >>negF  Thank you for allowing pharmacy to be a part of this patient's care.  Adria Dill, PharmD PGY-2  Infectious Diseases Resident  12/20/2022 8:36 AM

## 2022-12-20 NOTE — Progress Notes (Addendum)
Patient ID: Heather [name], female   DOB: 11/07/1971, 51 y.o.   MRN: [id number]  S: Feels well, no complaints O:BP 104/60 (BP Location: Right Arm)   Pulse 89   Temp 98.2 F (36.8 C) (Oral)   Resp 20   Ht '5\' 9"'$  (1.753 m)   Wt 99.9 kg   LMP 09/10/2021   SpO2 100%   BMI 32.52 kg/m  Exam: Gen: NAD CVS: RRR Resp:CTA Abd: +BS, soft, NT/ND Ext: no edema, s/p RBKA  OP HD: HD MWF  3h 45mn  400/700  102.3kg  2/2 bath  TDC  Heparin 7000 - venofer '50mg'$  weekly - hectorol 466m IV q HD  CXR 12/17 - no active disease   Assessment/Plan:  New embolic stroke: hx of recent endocarditis. HD center did have to use ceftaz at one point due to cefepime shortage but appears she otherwise completed her IV abx. Repeat echo here showed mass on posterior mitral valve. 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  ID following, cont IV abx for now while looking into other possible atypical infectious causes and non-infectious causes.  Acute encephalopathy: new embolic stroke on MRI but also took baclofen which is contraindicated in ESRD. Stopped baclofen. Thankfully she has woken up and is w/o complaints today.  ESRD -  HD MWF. Had HD here yesterday on holiday schedule. Next HD 12/27.   BP - Bp significantly elevated on arrival, then improved to normal and now today she is borderline hypotensive.   Volume - 2 L UF w/ HD yesterday. Now 2.5kg under dry wt, and BP's are down. Likely is over-dehydrated w/ HD. Will bolus 500 cc x 1-2 today and f/u BP's in am. Keep even next HD.   Anemia: Hgb above goal, no ESA indicated at this time.   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 Continue phoslo 3 ac as binder and cont IV vdra.   Nutrition:  Currently NPO T2DM: management per primary team  .RoKelly SplinterMD 12/20/2022, 1:06 PM  Recent Labs  Lab 12/18/22 0258 12/19/22 0257 12/20/22 0327  HGB 9.7* 9.4*  --   ALBUMIN 3.1*  2.9* 2.8*  CALCIUM 9.4 9.0 8.9  PHOS 7.2* 7.6* 4.8*  CREATININE 10.10* 12.17* 7.35*  K 3.9 4.1 4.5    Inpatient medications:  aspirin  81 mg Oral Daily   atorvastatin  80 mg Oral QHS   calcium acetate  667 mg Oral TID WC   Chlorhexidine Gluconate Cloth  6 each Topical Q0600   clopidogrel  75 mg Oral Daily   heparin  5,000 Units Subcutaneous Q8H   insulin aspart  0-15 Units Subcutaneous TID PC & HS   sodium chloride flush  10-40 mL Intracatheter Q12H    ceFEPime (MAXIPIME) IV 1 g (12/19/22 2300)   acetaminophen **OR** acetaminophen (TYLENOL) oral liquid 160 mg/5 mL **OR** acetaminophen, hydrocortisone, lip balm, sodium chloride flush

## 2022-12-20 NOTE — Plan of Care (Signed)
  Problem: Education: Goal: Knowledge of General Education information will improve Description: Including pain rating scale, medication(s)/side effects and non-pharmacologic comfort measures Outcome: Progressing   Problem: Health Behavior/Discharge Planning: Goal: Ability to manage health-related needs will improve Outcome: Progressing   Problem: Clinical Measurements: Goal: Ability to maintain clinical measurements within normal limits will improve Outcome: Progressing Goal: Will remain free from infection Outcome: Progressing Goal: Diagnostic test results will improve Outcome: Progressing Goal: Respiratory complications will improve Outcome: Progressing Goal: Cardiovascular complication will be avoided Outcome: Progressing   Problem: Nutrition: Goal: Adequate nutrition will be maintained Outcome: Progressing   Problem: Coping: Goal: Level of anxiety will decrease Outcome: Progressing   Problem: Elimination: Goal: Will not experience complications related to bowel motility Outcome: Progressing Goal: Will not experience complications related to urinary retention Outcome: Progressing   Problem: Pain Managment: Goal: General experience of comfort will improve Outcome: Progressing   Problem: Safety: Goal: Ability to remain free from injury will improve Outcome: Progressing   Problem: Activity: Goal: Risk for activity intolerance will decrease Outcome: Not Progressing

## 2022-12-20 NOTE — Plan of Care (Signed)
  Problem: Education: Goal: Knowledge of disease or condition will improve Outcome: Progressing Goal: Knowledge of secondary prevention will improve (MUST DOCUMENT ALL) Outcome: Progressing Goal: Knowledge of patient specific risk factors will improve (Mark N/A or DELETE if not current risk factor) Outcome: Progressing   Problem: Ischemic Stroke/TIA Tissue Perfusion: Goal: Complications of ischemic stroke/TIA will be minimized Outcome: Progressing   Problem: Coping: Goal: Will verbalize positive feelings about self Outcome: Progressing Goal: Will identify appropriate support needs Outcome: Progressing   Problem: Health Behavior/Discharge Planning: Goal: Ability to manage health-related needs will improve Outcome: Progressing Goal: Goals will be collaboratively established with patient/family Outcome: Progressing   Problem: Self-Care: Goal: Ability to participate in self-care as condition permits will improve Outcome: Progressing Goal: Verbalization of feelings and concerns over difficulty with self-care will improve Outcome: Progressing Goal: Ability to communicate needs accurately will improve Outcome: Progressing   Problem: Nutrition: Goal: Risk of aspiration will decrease Outcome: Progressing Goal: Dietary intake will improve Outcome: Progressing   Problem: Education: Goal: Ability to describe self-care measures that may prevent or decrease complications (Diabetes Survival Skills Education) will improve Outcome: Progressing Goal: Individualized Educational Video(s) Outcome: Progressing   Problem: Coping: Goal: Ability to adjust to condition or change in health will improve Outcome: Progressing   Problem: Fluid Volume: Goal: Ability to maintain a balanced intake and output will improve Outcome: Progressing   Problem: Health Behavior/Discharge Planning: Goal: Ability to identify and utilize available resources and services will improve Outcome:  Progressing Goal: Ability to manage health-related needs will improve Outcome: Progressing   Problem: Metabolic: Goal: Ability to maintain appropriate glucose levels will improve Outcome: Progressing   Problem: Nutritional: Goal: Maintenance of adequate nutrition will improve Outcome: Progressing Goal: Progress toward achieving an optimal weight will improve Outcome: Progressing   Problem: Skin Integrity: Goal: Risk for impaired skin integrity will decrease Outcome: Progressing   Problem: Tissue Perfusion: Goal: Adequacy of tissue perfusion will improve Outcome: Progressing   Problem: Education: Goal: Knowledge of General Education information will improve Description: Including pain rating scale, medication(s)/side effects and non-pharmacologic comfort measures Outcome: Progressing   Problem: Health Behavior/Discharge Planning: Goal: Ability to manage health-related needs will improve Outcome: Progressing   Problem: Clinical Measurements: Goal: Ability to maintain clinical measurements within normal limits will improve Outcome: Progressing Goal: Will remain free from infection Outcome: Progressing Goal: Diagnostic test results will improve Outcome: Progressing Goal: Respiratory complications will improve Outcome: Progressing Goal: Cardiovascular complication will be avoided Outcome: Progressing   Problem: Activity: Goal: Risk for activity intolerance will decrease Outcome: Progressing   Problem: Nutrition: Goal: Adequate nutrition will be maintained Outcome: Progressing   Problem: Coping: Goal: Level of anxiety will decrease Outcome: Progressing   Problem: Elimination: Goal: Will not experience complications related to bowel motility Outcome: Progressing Goal: Will not experience complications related to urinary retention Outcome: Progressing   Problem: Pain Managment: Goal: General experience of comfort will improve Outcome: Progressing   Problem:  Safety: Goal: Ability to remain free from injury will improve Outcome: Progressing   Problem: Skin Integrity: Goal: Risk for impaired skin integrity will decrease Outcome: Progressing   

## 2022-12-20 NOTE — Progress Notes (Signed)
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  MRI brain on admission was positive for new possible embolic right occipital infarcts when compared to October.  Neurology was consulted.  She has had issues with encephalopathy, aphasia and dysphagia which is slowly improving.  Nephrology consulted to continue dialysis.  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  Cardiology was consulted for TEE and ID was consulted.  She underwent TEE on 12/16/2022  Assessment & Plan:   Acute ischemic right occipital infarcts, possibly embolic/Acute metabolic encephalopathy -Presented with encephalopathy, aphasia and was nonverbal -MRI brain showed new right occipital ischemic infarcts possibly from the valvular mass/vegetation.   Neurology recommended aspirin and Plavix for 3 weeks then aspirin alone. -A1c 7.4.  LDL 135.  Continue Lipitor. -EEG negative for seizures.  LTM EEG negative for seizures.   Mental status has improved.  Continue to monitor. SNF recommended by PT OT.   Stable from a neurological standpoint.  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   There is a note from Dr. Kipp Brood with cardiothoracic surgery on 12/22.  According to the note there is no plan for surgical intervention.   Patient and husband wants to speak to him again.  Cardiothoracic team will be back to discuss with patient on 12/26. Currently on cefepime and vancomycin as per ID. Remains afebrile.  End-stage renal disease on hemodialysis -Nephrology following.  Dialysis as per nephrology Patient concerned about low blood pressures with dialysis.  Apparently drop in blood pressure impairs her hearing.  Patient not noted to be on any antihypertensives.  She was reassured.  We will continue to monitor.  History of PVD status post right BKA -Currently wheelchair-bound  Anemia of chronic disease Hemoglobin stable.  Monitor intermittently.  Hyponatremia Stable  Thrombocytopenia -Questionable cause.  Resolved  Diabetes mellitus type 2 with hyperglycemia -A1c 7.9.  Continue CBGs with SSI  Goals of care -Palliative care consultation appreciated.  Currently remains full code.  DVT prophylaxis: Heparin subcutaneous Code Status: Full Family Communication: Husband at bedside Disposition Plan: SNF when medically stable   Status is: Inpatient Remains inpatient appropriate because: Acute stroke, infective endocarditis    Consultants: Nephrology/neurology/cardiothoracic surgery/ID.  Palliative care  Procedures: As above  Antimicrobials:  Anti-infectives (From admission, onward)    Start     Dose/Rate Route Frequency Ordered Stop   12/19/22 1200  vancomycin (VANCOCIN) IVPB 1000 mg/200 mL premix        1,000 mg 200 mL/hr over 60 Minutes Intravenous Once in dialysis 12/19/22 0828 12/19/22 1631   12/17/22 0900  vancomycin (VANCOREADY) IVPB 500 mg/100 mL  Status:  Discontinued        500 mg 100 mL/hr over 60 Minutes Intravenous  Once 12/17/22 0812 12/17/22 0816   12/16/22 1200  vancomycin (VANCOCIN) IVPB 1000 mg/200 mL premix  Status:  Discontinued  1,000 mg 200 mL/hr over 60 Minutes Intravenous  Once 12/15/22 1228 12/17/22 0812   12/15/22 1200  vancomycin (VANCOCIN) IVPB 1000 mg/200 mL  premix  Status:  Discontinued        1,000 mg 200 mL/hr over 60 Minutes Intravenous Every M-W-F (Hemodialysis) 12/13/22 1823 12/15/22 1228   12/14/22 2200  ceFEPIme (MAXIPIME) 1 g in sodium chloride 0.9 % 100 mL IVPB        1 g 200 mL/hr over 30 Minutes Intravenous Every 24 hours 12/14/22 1322     12/14/22 1500  vancomycin (VANCOCIN) IVPB 1000 mg/200 mL premix        1,000 mg 200 mL/hr over 60 Minutes Intravenous  Once 12/14/22 1329 12/14/22 1532   12/13/22 2200  piperacillin-tazobactam (ZOSYN) IVPB 2.25 g  Status:  Discontinued        2.25 g 100 mL/hr over 30 Minutes Intravenous Every 8 hours 12/13/22 1820 12/14/22 1322   12/13/22 1830  vancomycin (VANCOCIN) 2,250 mg in sodium chloride 0.9 % 500 mL IVPB        2,250 mg 261.3 mL/hr over 120 Minutes Intravenous  Once 12/13/22 1820 12/14/22 0115        Subjective: Concerned about low blood pressures.  Denies any other concerns at this time.  Denies any dizziness lightheadedness.   Objective: Vitals:   12/19/22 2009 12/20/22 0032 12/20/22 0341 12/20/22 0741  BP: (!) 96/55 (!) 100/55 (!) 108/59 104/60  Pulse: 90 86 85 89  Resp: '17 20 11 20  '$ Temp: 98.4 F (36.9 C) 98.4 F (36.9 C) 98.1 F (36.7 C) 98.2 F (36.8 C)  TempSrc: Oral Oral Oral Oral  SpO2: 97% 96% 100% 100%  Weight:      Height:        Intake/Output Summary (Last 24 hours) at 12/20/2022 1034 Last data filed at 12/20/2022 0948 Gross per 24 hour  Intake 250 ml  Output 2000 ml  Net -1750 ml    Filed Weights   12/16/22 2152 12/19/22 1306 12/19/22 1651  Weight: 98.8 kg 101.5 kg 99.9 kg    Examination:  General appearance: Awake alert.  In no distress Resp: Clear to auscultation bilaterally.  Normal effort Cardio: S1-S2 is normal regular.  No S3-S4.  No rubs murmurs or bruit GI: Abdomen is soft.  Nontender nondistended.  Bowel sounds are present normal.  No masses organomegaly     Data Reviewed: I have personally reviewed following labs and imaging  studies  CBC: Recent Labs  Lab 12/15/22 0403 12/16/22 0500 12/17/22 0642 12/18/22 0258 12/19/22 0257  WBC 6.5 7.6 8.0 6.1 6.9  NEUTROABS 3.9 4.9 5.4 3.4 3.5  HGB 11.3* 10.5* 10.2* 9.7* 9.4*  HCT 34.4* 32.5* 31.2* 30.0* 30.3*  MCV 92.0 93.7 93.4 94.0 95.9  PLT 131* 150 158 147* 921    Basic Metabolic Panel: Recent Labs  Lab 12/15/22 0403 12/16/22 0500 12/17/22 0642 12/18/22 0258 12/19/22 0257 12/20/22 0327  NA  --  133* 135 136 135 134*  K  --  4.7 3.9 3.9 4.1 4.5  CL  --  96* 92* 96* 97* 97*  CO2  --  '23 26 23 26 '$ 21*  GLUCOSE  --  99 107* 147* 139* 166*  BUN  --  47* 30* 41* 52* 30*  CREATININE  --  10.84* 8.09* 10.10* 12.17* 7.35*  CALCIUM  --  9.1 9.6 9.4 9.0 8.9  MG 2.0 2.3 2.1 2.0 2.2  --   PHOS  --  7.7*  5.0* 7.2* 7.6* 4.8*    GFR: Estimated Creatinine Clearance: 11.1 mL/min (A) (by C-G formula based on SCr of 7.35 mg/dL (H)).  Liver Function Tests: Recent Labs  Lab 12/16/22 0500 12/17/22 0642 12/18/22 0258 12/19/22 0257 12/20/22 0327  ALBUMIN 3.2* 3.1* 3.1* 2.9* 2.8*      CBG: Recent Labs  Lab 12/19/22 1217 12/19/22 1832 12/19/22 2123 12/20/22 0603 12/20/22 0944  GLUCAP 161* 203* 173* 181* 184*      Recent Results (from the past 240 hour(s))  Resp panel by RT-PCR (RSV, Flu A&B, Covid) Anterior Nasal Swab     Status: None   Collection Time: 12/12/22  1:57 PM   Specimen: Anterior Nasal Swab  Result Value Ref Range Status   SARS Coronavirus 2 by RT PCR NEGATIVE NEGATIVE Final    Comment: (NOTE) SARS-CoV-2 target nucleic acids are NOT DETECTED.  The SARS-CoV-2 RNA is generally detectable in upper respiratory specimens during the acute phase of infection. The lowest concentration of SARS-CoV-2 viral copies this assay can detect is 138 copies/mL. A negative result does not preclude SARS-Cov-2 infection and should not be used as the sole basis for treatment or other patient management decisions. A negative result may occur with   improper specimen collection/handling, submission of specimen other than nasopharyngeal swab, presence of viral mutation(s) within the areas targeted by this assay, and inadequate number of viral copies(<138 copies/mL). A negative result must be combined with clinical observations, patient history, and epidemiological information. The expected result is Negative.  Fact Sheet for Patients:  EntrepreneurPulse.com.au  Fact Sheet for Healthcare Providers:  IncredibleEmployment.be  This test is no t yet approved or cleared by the [place] FDA and  has been authorized for detection and/or diagnosis of SARS-CoV-2 by FDA under an Emergency Use Authorization (EUA). This EUA will remain  in effect (meaning this test can be used) for the duration of the COVID-19 declaration under Section 564(b)(1) of the Act, 21 U.S.C.section 360bbb-3(b)(1), unless the authorization is terminated  or revoked sooner.       Influenza A by PCR NEGATIVE NEGATIVE Final   Influenza B by PCR NEGATIVE NEGATIVE Final    Comment: (NOTE) The Xpert Xpress SARS-CoV-2/FLU/RSV plus assay is intended as an aid in the diagnosis of influenza from Nasopharyngeal swab specimens and should not be used as a sole basis for treatment. Nasal washings and aspirates are unacceptable for Xpert Xpress SARS-CoV-2/FLU/RSV testing.  Fact Sheet for Patients: EntrepreneurPulse.com.au  Fact Sheet for Healthcare Providers: IncredibleEmployment.be  This test is not yet approved or cleared by the [place] FDA and has been authorized for detection and/or diagnosis of SARS-CoV-2 by FDA under an Emergency Use Authorization (EUA). This EUA will remain in effect (meaning this test can be used) for the duration of the COVID-19 declaration under Section 564(b)(1) of the Act, 21 U.S.C. section 360bbb-3(b)(1), unless the authorization is terminated or revoked.      Resp Syncytial Virus by PCR NEGATIVE NEGATIVE Final    Comment: (NOTE) Fact Sheet for Patients: EntrepreneurPulse.com.au  Fact Sheet for Healthcare Providers: IncredibleEmployment.be  This test is not yet approved or cleared by the [place] FDA and has been authorized for detection and/or diagnosis of SARS-CoV-2 by FDA under an Emergency Use Authorization (EUA). This EUA will remain in effect (meaning this test can be used) for the duration of the COVID-19 declaration under Section 564(b)(1) of the Act, 21 U.S.C. section 360bbb-3(b)(1), unless the authorization is terminated or revoked.  Performed at [facility]  Lab, [address], [place], [place] [address]   Culture, blood (Routine X 2) w Reflex to ID Panel     Status: None   Collection Time: 12/13/22  8:55 PM   Specimen: BLOOD RIGHT ARM  Result Value Ref Range Status   Specimen Description BLOOD RIGHT ARM  Final   Special Requests   Final    BOTTLES DRAWN AEROBIC ONLY Blood Culture results may not be optimal due to an inadequate volume of blood received in culture bottles   Culture   Final    NO GROWTH 5 DAYS Performed at [facility], [place] [address], [place], [place] [address]    Report Status 12/18/2022 FINAL  Final  Culture, blood (Routine X 2) w Reflex to ID Panel     Status: None   Collection Time: 12/13/22  9:15 PM   Specimen: BLOOD LEFT ARM  Result Value Ref Range Status   Specimen Description BLOOD LEFT ARM  Final   Special Requests   Final    BOTTLES DRAWN AEROBIC AND ANAEROBIC Blood Culture results may not be optimal due to an inadequate volume of blood received in culture bottles   Culture   Final    NO GROWTH 5 DAYS Performed at [facility], [place] [address] [place], [place] [address]    Report Status 12/18/2022 FINAL  Final  Culture, blood (Routine X 2) w Reflex to ID Panel     Status: None   Collection Time: 12/14/22  3:55 AM   Specimen: BLOOD  LEFT HAND  Result Value Ref Range Status   Specimen Description BLOOD LEFT HAND  Final   Special Requests   Final    BOTTLES DRAWN AEROBIC ONLY Blood Culture results may not be optimal due to an inadequate volume of blood received in culture bottles   Culture   Final    NO GROWTH 5 DAYS Performed at [facility], [place] [address], [place], [place] [address]    Report Status 12/19/2022 FINAL  Final  Culture, blood (Routine X 2) w Reflex to ID Panel     Status: None   Collection Time: 12/14/22  4:08 AM   Specimen: BLOOD RIGHT HAND  Result Value Ref Range Status   Specimen Description BLOOD RIGHT HAND  Final   Special Requests   Final    BOTTLES DRAWN AEROBIC ONLY Blood Culture results may not be optimal due to an inadequate volume of blood received in culture bottles   Culture   Final    NO GROWTH 5 DAYS Performed at [facility], [place] [address], [place], [place] [address]    Report Status 12/19/2022 FINAL  Final         Radiology Studies: No results found.   Scheduled Meds:  aspirin  81 mg Oral Daily   atorvastatin  80 mg Oral QHS   calcium acetate  667 mg Oral TID WC   Chlorhexidine Gluconate Cloth  6 each Topical Q0600   clopidogrel  75 mg Oral Daily   heparin  5,000 Units Subcutaneous Q8H   insulin aspart  0-15 Units Subcutaneous TID PC & HS   sodium chloride flush  10-40 mL Intracatheter Q12H   Continuous Infusions:  ceFEPime (MAXIPIME) IV 1 g (12/19/22 2300)      Bonnielee Haff, MD [facility] 12/20/2022, 10:34 AM

## 2022-12-21 DIAGNOSIS — I639 Cerebral infarction, unspecified: Secondary | ICD-10-CM | POA: Diagnosis not present

## 2022-12-21 DIAGNOSIS — I330 Acute and subacute infective endocarditis: Secondary | ICD-10-CM | POA: Diagnosis not present

## 2022-12-21 LAB — RENAL FUNCTION PANEL
Albumin: 2.8 g/dL — ABNORMAL LOW (ref 3.5–5.0)
Anion gap: 13 (ref 5–15)
BUN: 47 mg/dL — ABNORMAL HIGH (ref 6–20)
CO2: 20 mmol/L — ABNORMAL LOW (ref 22–32)
Calcium: 9.3 mg/dL (ref 8.9–10.3)
Chloride: 100 mmol/L (ref 98–111)
Creatinine, Ser: 9.97 mg/dL — ABNORMAL HIGH (ref 0.44–1.00)
GFR, Estimated: 4 mL/min — ABNORMAL LOW (ref >60)
Glucose, Bld: 126 mg/dL — ABNORMAL HIGH (ref 70–99)
Phosphorus: 5.4 mg/dL — ABNORMAL HIGH (ref 2.5–4.6)
Potassium: 4.1 mmol/L (ref 3.5–5.1)
Sodium: 133 mmol/L — ABNORMAL LOW (ref 135–145)

## 2022-12-21 LAB — LEGIONELLA PNEUMOPHILA TOTAL AB: Legionella Pneumo Total Ab: <0.91 OD ratio (ref 0.00–0.90)

## 2022-12-21 LAB — GLUCOSE, CAPILLARY
Glucose-Capillary: 154 mg/dL — ABNORMAL HIGH (ref 70–99)
Glucose-Capillary: 160 mg/dL — ABNORMAL HIGH (ref 70–99)
Glucose-Capillary: 159 mg/dL — ABNORMAL HIGH (ref 70–99)
Glucose-Capillary: 163 mg/dL — ABNORMAL HIGH (ref 70–99)

## 2022-12-21 LAB — BRUCELLA ANTIBODY IGG, EIA: Brucella Antibody IgG, EIA: NEGATIVE

## 2022-12-21 LAB — BRUCELLA ANTIBODY IGM, EIA: Brucella Antibody IgM, EIA: NEGATIVE

## 2022-12-21 MED ORDER — SODIUM CHLORIDE 0.9 % IV SOLN: INTRAVENOUS | Status: AC

## 2022-12-21 MED ORDER — CHLORHEXIDINE GLUCONATE CLOTH 2 % EX PADS
6.0000 | MEDICATED_PAD | Freq: Every day | CUTANEOUS | Status: DC
  Administered 2022-12-22 – 2022-12-24 (×3): 6 via TOPICAL

## 2022-12-21 MED ORDER — VANCOMYCIN VARIABLE DOSE PER UNSTABLE RENAL FUNCTION (PHARMACIST DOSING): Status: DC

## 2022-12-21 MED ORDER — CALCIUM ACETATE (PHOS BINDER) 667 MG PO CAPS
1334.0000 mg | ORAL_CAPSULE | Freq: Three times a day (TID) | ORAL | Status: DC
  Administered 2022-12-21 – 2022-12-24 (×5): 1334 mg via ORAL
  Filled 2022-12-21 (×8): qty 2

## 2022-12-21 NOTE — Plan of Care (Signed)
  Problem: Education: Goal: Knowledge of disease or condition will improve Outcome: Progressing   Problem: Ischemic Stroke/TIA Tissue Perfusion: Goal: Complications of ischemic stroke/TIA will be minimized Outcome: Progressing   Problem: Coping: Goal: Will verbalize positive feelings about self Outcome: Progressing   Problem: Skin Integrity: Goal: Risk for impaired skin integrity will decrease Outcome: Progressing   Problem: Activity: Goal: Risk for activity intolerance will decrease Outcome: Progressing

## 2022-12-21 NOTE — Progress Notes (Signed)
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  MRI brain on admission was positive for new possible embolic right occipital infarcts when compared to October.  Neurology was consulted.  She has had issues with encephalopathy, aphasia and dysphagia which is slowly improving.  Nephrology consulted to continue dialysis.  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  Cardiology was consulted for TEE and ID was consulted.  She underwent TEE on 12/16/2022  Assessment & Plan:   Acute ischemic right occipital infarcts, possibly embolic/Acute metabolic encephalopathy -Presented with encephalopathy, aphasia and was nonverbal -MRI brain showed new right occipital ischemic infarcts possibly from the valvular mass/vegetation.   Neurology recommended aspirin and Plavix for 3 weeks then aspirin alone. -A1c 7.4.  LDL 135.  Continue Lipitor. -EEG negative for seizures.  LTM EEG negative for seizures.   Mental status has improved.  Continue to monitor. SNF recommended by PT OT.   Stable from a neurological standpoint.  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   There is a note from Dr. Kipp Brood with cardiothoracic surgery on 12/22.  According to the note there is no plan for surgical intervention.    Patient and husband wants to speak to him again.  Cardiothoracic team will be back to discuss with patient today. Currently on cefepime and vancomycin as per ID. Remains afebrile.  End-stage renal disease on hemodialysis -Nephrology following.  Dialysis as per nephrology Patient concerned about low blood pressures with dialysis.  Apparently drop in blood pressure impairs her hearing.  Patient not noted to be on any antihypertensives.  She was reassured.  We will continue to monitor.  History of PVD status post right BKA Currently wheelchair-bound  Anemia of chronic disease Hemoglobin stable.  Monitor intermittently.  Hyponatremia Stable  Thrombocytopenia Questionable cause.  Resolved  Diabetes mellitus type 2 with hyperglycemia A1c 7.9.  Continue CBGs with SSI  Goals of care Palliative care consultation appreciated.  Currently remains full code.  DVT prophylaxis: Heparin subcutaneous Code Status: Full Family Communication: Husband at bedside Disposition Plan: SNF when medically stable   Status is: Inpatient Remains inpatient appropriate because: Acute stroke, infective endocarditis    Consultants: Nephrology/neurology/cardiothoracic surgery/ID.  Palliative care  Procedures: As above  Antimicrobials:  Anti-infectives (From admission, onward)    Start     Dose/Rate Route Frequency Ordered Stop   12/21/22 0721  vancomycin variable dose per unstable renal function (pharmacist dosing)         Does not apply See admin instructions 12/21/22 0721     12/19/22 1200  vancomycin (VANCOCIN) IVPB 1000 mg/200 mL premix        1,000 mg 200 mL/hr over 60 Minutes Intravenous Once in dialysis 12/19/22 0828 12/19/22 1631   12/17/22 0900  vancomycin (VANCOREADY) IVPB 500 mg/100 mL  Status:  Discontinued        500 mg 100  mL/hr over 60 Minutes Intravenous  Once 12/17/22 0812 12/17/22 0816   12/16/22 1200  vancomycin (VANCOCIN) IVPB 1000 mg/200 mL premix  Status:  Discontinued         1,000 mg 200 mL/hr over 60 Minutes Intravenous  Once 12/15/22 1228 12/17/22 0812   12/15/22 1200  vancomycin (VANCOCIN) IVPB 1000 mg/200 mL premix  Status:  Discontinued        1,000 mg 200 mL/hr over 60 Minutes Intravenous Every M-W-F (Hemodialysis) 12/13/22 1823 12/15/22 1228   12/14/22 2200  ceFEPIme (MAXIPIME) 1 g in sodium chloride 0.9 % 100 mL IVPB        1 g 200 mL/hr over 30 Minutes Intravenous Every 24 hours 12/14/22 1322     12/14/22 1500  vancomycin (VANCOCIN) IVPB 1000 mg/200 mL premix        1,000 mg 200 mL/hr over 60 Minutes Intravenous  Once 12/14/22 1329 12/14/22 1532   12/13/22 2200  piperacillin-tazobactam (ZOSYN) IVPB 2.25 g  Status:  Discontinued        2.25 g 100 mL/hr over 30 Minutes Intravenous Every 8 hours 12/13/22 1820 12/14/22 1322   12/13/22 1830  vancomycin (VANCOCIN) 2,250 mg in sodium chloride 0.9 % 500 mL IVPB        2,250 mg 261.3 mL/hr over 120 Minutes Intravenous  Once 12/13/22 1820 12/14/22 0115        Subjective: Denies any complaints.  Hearing has improved.   Objective: Vitals:   12/20/22 1932 12/21/22 0002 12/21/22 0407 12/21/22 0809  BP: 134/64 109/65 134/63 (!) 146/75  Pulse: 94 90 88 89  Resp: '18 16 16 20  '$ Temp: 97.6 F (36.4 C) 98.7 F (37.1 C) 98.3 F (36.8 C) 98.1 F (36.7 C)  TempSrc: Oral Oral Oral Oral  SpO2: 95% 95% 96% 96%  Weight:      Height:        Intake/Output Summary (Last 24 hours) at 12/21/2022 0958 Last data filed at 12/21/2022 0645 Gross per 24 hour  Intake 614.34 ml  Output --  Net 614.34 ml    Filed Weights   12/16/22 2152 12/19/22 1306 12/19/22 1651  Weight: 98.8 kg 101.5 kg 99.9 kg    Examination:  General appearance: Awake alert.  In no distress Resp: Clear to auscultation bilaterally.  Normal effort Cardio: S1-S2 is normal regular.  No S3-S4.  No rubs murmurs or bruit GI: Abdomen is soft.  Nontender nondistended.  Bowel sounds are present normal.  No masses organomegaly     Data  Reviewed: I have personally reviewed following labs and imaging studies  CBC: Recent Labs  Lab 12/15/22 0403 12/16/22 0500 12/17/22 0642 12/18/22 0258 12/19/22 0257  WBC 6.5 7.6 8.0 6.1 6.9  NEUTROABS 3.9 4.9 5.4 3.4 3.5  HGB 11.3* 10.5* 10.2* 9.7* 9.4*  HCT 34.4* 32.5* 31.2* 30.0* 30.3*  MCV 92.0 93.7 93.4 94.0 95.9  PLT 131* 150 158 147* 299    Basic Metabolic Panel: Recent Labs  Lab 12/15/22 0403 12/16/22 0500 12/17/22 0642 12/18/22 0258 12/19/22 0257 12/20/22 0327 12/21/22 0455  NA  --  133* 135 136 135 134* 133*  K  --  4.7 3.9 3.9 4.1 4.5 4.1  CL  --  96* 92* 96* 97* 97* 100  CO2  --  '23 26 23 26 '$ 21* 20*  GLUCOSE  --  99 107* 147* 139* 166* 126*  BUN  --  47* 30* 41* 52* 30* 47*  CREATININE  --  10.84* 8.09* 10.10*  12.17* 7.35* 9.97*  CALCIUM  --  9.1 9.6 9.4 9.0 8.9 9.3  MG 2.0 2.3 2.1 2.0 2.2  --   --   PHOS  --  7.7* 5.0* 7.2* 7.6* 4.8* 5.4*    GFR: Estimated Creatinine Clearance: 8.2 mL/min (A) (by C-G formula based on SCr of 9.97 mg/dL (H)).  Liver Function Tests: Recent Labs  Lab 12/17/22 0642 12/18/22 0258 12/19/22 0257 12/20/22 0327 12/21/22 0455  ALBUMIN 3.1* 3.1* 2.9* 2.8* 2.8*      CBG: Recent Labs  Lab 12/20/22 0944 12/20/22 1157 12/20/22 1714 12/20/22 2121 12/21/22 0641  GLUCAP 184* 221* 118* 183* 154*      Recent Results (from the past 240 hour(s))  Resp panel by RT-PCR (RSV, Flu A&B, Covid) Anterior Nasal Swab     Status: None   Collection Time: 12/12/22  1:57 PM   Specimen: Anterior Nasal Swab  Result Value Ref Range Status   SARS Coronavirus 2 by RT PCR NEGATIVE NEGATIVE Final    Comment: (NOTE) SARS-CoV-2 target nucleic acids are NOT DETECTED.  The SARS-CoV-2 RNA is generally detectable in upper respiratory specimens during the acute phase of infection. The lowest concentration of SARS-CoV-2 viral copies this assay can detect is 138 copies/mL. A negative result does not preclude SARS-Cov-2 infection and should  not be used as the sole basis for treatment or other patient management decisions. A negative result may occur with  improper specimen collection/handling, submission of specimen other than nasopharyngeal swab, presence of viral mutation(s) within the areas targeted by this assay, and inadequate number of viral copies(<138 copies/mL). A negative result must be combined with clinical observations, patient history, and epidemiological information. The expected result is Negative.  Fact Sheet for Patients:  EntrepreneurPulse.com.au  Fact Sheet for Healthcare Providers:  IncredibleEmployment.be  This test is no t yet approved or cleared by the [place] FDA and  has been authorized for detection and/or diagnosis of SARS-CoV-2 by FDA under an Emergency Use Authorization (EUA). This EUA will remain  in effect (meaning this test can be used) for the duration of the COVID-19 declaration under Section 564(b)(1) of the Act, 21 U.S.C.section 360bbb-3(b)(1), unless the authorization is terminated  or revoked sooner.       Influenza A by PCR NEGATIVE NEGATIVE Final   Influenza B by PCR NEGATIVE NEGATIVE Final    Comment: (NOTE) The Xpert Xpress SARS-CoV-2/FLU/RSV plus assay is intended as an aid in the diagnosis of influenza from Nasopharyngeal swab specimens and should not be used as a sole basis for treatment. Nasal washings and aspirates are unacceptable for Xpert Xpress SARS-CoV-2/FLU/RSV testing.  Fact Sheet for Patients: EntrepreneurPulse.com.au  Fact Sheet for Healthcare Providers: IncredibleEmployment.be  This test is not yet approved or cleared by the [place] FDA and has been authorized for detection and/or diagnosis of SARS-CoV-2 by FDA under an Emergency Use Authorization (EUA). This EUA will remain in effect (meaning this test can be used) for the duration of the COVID-19 declaration under  Section 564(b)(1) of the Act, 21 U.S.C. section 360bbb-3(b)(1), unless the authorization is terminated or revoked.     Resp Syncytial Virus by PCR NEGATIVE NEGATIVE Final    Comment: (NOTE) Fact Sheet for Patients: EntrepreneurPulse.com.au  Fact Sheet for Healthcare Providers: IncredibleEmployment.be  This test is not yet approved or cleared by the [place] FDA and has been authorized for detection and/or diagnosis of SARS-CoV-2 by FDA under an Emergency Use Authorization (EUA). This EUA will remain in effect (meaning this  test can be used) for the duration of the COVID-19 declaration under Section 564(b)(1) of the Act, 21 U.S.C. section 360bbb-3(b)(1), unless the authorization is terminated or revoked.  Performed at [facility], [place] [address] [place], [place] [address]   Culture, blood (Routine X 2) w Reflex to ID Panel     Status: None   Collection Time: 12/13/22  8:55 PM   Specimen: BLOOD RIGHT ARM  Result Value Ref Range Status   Specimen Description BLOOD RIGHT ARM  Final   Special Requests   Final    BOTTLES DRAWN AEROBIC ONLY Blood Culture results may not be optimal due to an inadequate volume of blood received in culture bottles   Culture   Final    NO GROWTH 5 DAYS Performed at [facility], [place] [address], [place], [place] [address]    Report Status 12/18/2022 FINAL  Final  Culture, blood (Routine X 2) w Reflex to ID Panel     Status: None   Collection Time: 12/13/22  9:15 PM   Specimen: BLOOD LEFT ARM  Result Value Ref Range Status   Specimen Description BLOOD LEFT ARM  Final   Special Requests   Final    BOTTLES DRAWN AEROBIC AND ANAEROBIC Blood Culture results may not be optimal due to an inadequate volume of blood received in culture bottles   Culture   Final    NO GROWTH 5 DAYS Performed at [facility], [place] [address] [place], [place] [address]    Report Status 12/18/2022 FINAL  Final   Culture, blood (Routine X 2) w Reflex to ID Panel     Status: None   Collection Time: 12/14/22  3:55 AM   Specimen: BLOOD LEFT HAND  Result Value Ref Range Status   Specimen Description BLOOD LEFT HAND  Final   Special Requests   Final    BOTTLES DRAWN AEROBIC ONLY Blood Culture results may not be optimal due to an inadequate volume of blood received in culture bottles   Culture   Final    NO GROWTH 5 DAYS Performed at [facility], [place] [address], [place], [place] [address]    Report Status 12/19/2022 FINAL  Final  Culture, blood (Routine X 2) w Reflex to ID Panel     Status: None   Collection Time: 12/14/22  4:08 AM   Specimen: BLOOD RIGHT HAND  Result Value Ref Range Status   Specimen Description BLOOD RIGHT HAND  Final   Special Requests   Final    BOTTLES DRAWN AEROBIC ONLY Blood Culture results may not be optimal due to an inadequate volume of blood received in culture bottles   Culture   Final    NO GROWTH 5 DAYS Performed at [facility], [place] [address], [place], [place] [address]    Report Status 12/19/2022 FINAL  Final         Radiology Studies: No results found.   Scheduled Meds:  aspirin  81 mg Oral Daily   atorvastatin  80 mg Oral QHS   calcium acetate  667 mg Oral TID WC   Chlorhexidine Gluconate Cloth  6 each Topical Q0600   clopidogrel  75 mg Oral Daily   [START ON 12/22/2022] doxercalciferol  4 mcg Intravenous Q M,W,F-HD   heparin  5,000 Units Subcutaneous Q8H   insulin aspart  0-15 Units Subcutaneous TID PC & HS   sodium chloride flush  10-40 mL Intracatheter Q12H  vancomycin variable dose per unstable renal function (pharmacist dosing)   Does not apply See admin instructions   Continuous Infusions:  sodium chloride 50 mL/hr at 12/21/22 0645   ceFEPime (MAXIPIME) IV Stopped (12/20/22 2225)      Bonnielee Haff, MD [facility] 12/21/2022, 9:58 AM

## 2022-12-21 NOTE — Progress Notes (Signed)
Paged CVTS PA per patient request d/t Dr. Kipp Brood having not been by patient's room yet today. CVTS PA states Dr. Kipp Brood is no longer at the hospital and he will leave a note for him to see patient tomorrow (12/27).

## 2022-12-21 NOTE — Progress Notes (Signed)
Patient ID: Heather [name], female   DOB: 11-Aug-1971, 51 y.o.   MRN: [id number]  S: Feels well, no complaints, BP's are up in the 130s which is more her normal  O:BP (!) 142/62 (BP Location: Left Arm)   Pulse 69   Temp 97.9 F (36.6 C) (Axillary)   Resp 15   Ht '5\' 9"'$  (1.753 m)   Wt 99.9 kg   LMP 09/10/2021   SpO2 98%   BMI 32.52 kg/m  Exam: Gen: NAD CVS: RRR Resp:CTA Abd: +BS, soft, NT/ND Ext: no edema, s/p RBKA  OP HD: HD MWF  3h 28mn  400/700  102.3kg  2/2 bath  TDC  Heparin 7000 - venofer '50mg'$  weekly - hectorol 435m IV q HD  CXR 12/17 - no active disease   Assessment/Plan:  New embolic stroke: hx of recent endocarditis. HD center did have to use ceftaz at one point due to cefepime shortage but appears she otherwise completed her IV abx. Repeat echo here showed mass on posterior mitral valve. 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  ID following, cont IV abx for now while looking into other possible atypical infectious causes and non-infectious causes.  Acute encephalopathy: new embolic stroke on MRI but also took baclofen which is contraindicated in ESRD. Stopped baclofen. Thankfully she has woken up and is w/o complaints today.  ESRD -  HD MWF. Had HD here yesterday on holiday schedule. Next HD 12/27.   BP/volume - Bp significantly elevated on arrival, then dropped to normal and to low yesterday in the 90s. Started IVF"s and wt's are up and BP is better at 130/80 today. Will cont IVF for another 6 hrs then dc. Keep even next HD  Anemia: Hgb above goal, no ESA indicated at this time.   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 Continue phoslo 2- 3 ac as binder and cont IV vdra.   Nutrition:  Currently NPO T2DM: management per primary team  .RoKelly SplinterMD 12/21/2022, 4:16 PM  Recent Labs  Lab 12/18/22 0258 12/19/22 0257 12/20/22 0327 12/21/22 0455  HGB 9.7* 9.4*  --   --    ALBUMIN 3.1* 2.9* 2.8* 2.8*  CALCIUM 9.4 9.0 8.9 9.3  PHOS 7.2* 7.6* 4.8* 5.4*  CREATININE 10.10* 12.17* 7.35* 9.97*  K 3.9 4.1 4.5 4.1     Inpatient medications:  aspirin  81 mg Oral Daily   atorvastatin  80 mg Oral QHS   calcium acetate  667 mg Oral TID WC   Chlorhexidine Gluconate Cloth  6 each Topical Q0600   clopidogrel  75 mg Oral Daily   [START ON 12/22/2022] doxercalciferol  4 mcg Intravenous Q M,W,F-HD   heparin  5,000 Units Subcutaneous Q8H   insulin aspart  0-15 Units Subcutaneous TID PC & HS   sodium chloride flush  10-40 mL Intracatheter Q12H   vancomycin variable dose per unstable renal function (pharmacist dosing)   Does not apply See admin instructions    sodium chloride 50 mL/hr at 12/21/22 0645   ceFEPime (MAXIPIME) IV Stopped (12/20/22 2225)   acetaminophen **OR** acetaminophen (TYLENOL) oral liquid 160 mg/5 mL **OR** acetaminophen, hydrocortisone, lip balm, sodium chloride flush

## 2022-12-22 DIAGNOSIS — I639 Cerebral infarction, unspecified: Secondary | ICD-10-CM | POA: Diagnosis not present

## 2022-12-22 LAB — CBC
HCT: 30 % — ABNORMAL LOW (ref 36.0–46.0)
Hemoglobin: 9.2 g/dL — ABNORMAL LOW (ref 12.0–15.0)
MCH: 29.9 pg (ref 26.0–34.0)
MCHC: 30.7 g/dL (ref 30.0–36.0)
MCV: 97.4 fL (ref 80.0–100.0)
Platelets: 169 10*3/uL (ref 150–400)
RBC: 3.08 MIL/uL — ABNORMAL LOW (ref 3.87–5.11)
RDW: 15 % (ref 11.5–15.5)
WBC: 6.8 10*3/uL (ref 4.0–10.5)
nRBC: 0 % (ref 0.0–0.2)

## 2022-12-22 LAB — GLUCOSE, CAPILLARY
Glucose-Capillary: 175 mg/dL — ABNORMAL HIGH (ref 70–99)
Glucose-Capillary: 181 mg/dL — ABNORMAL HIGH (ref 70–99)
Glucose-Capillary: 115 mg/dL — ABNORMAL HIGH (ref 70–99)
Glucose-Capillary: 169 mg/dL — ABNORMAL HIGH (ref 70–99)
Glucose-Capillary: 210 mg/dL — ABNORMAL HIGH (ref 70–99)

## 2022-12-22 LAB — RENAL FUNCTION PANEL
Albumin: 2.9 g/dL — ABNORMAL LOW (ref 3.5–5.0)
Anion gap: 12 (ref 5–15)
BUN: 56 mg/dL — ABNORMAL HIGH (ref 6–20)
CO2: 24 mmol/L (ref 22–32)
Calcium: 9.6 mg/dL (ref 8.9–10.3)
Chloride: 99 mmol/L (ref 98–111)
Creatinine, Ser: 11.5 mg/dL — ABNORMAL HIGH (ref 0.44–1.00)
GFR, Estimated: 4 mL/min — ABNORMAL LOW (ref >60)
Glucose, Bld: 163 mg/dL — ABNORMAL HIGH (ref 70–99)
Phosphorus: 5.3 mg/dL — ABNORMAL HIGH (ref 2.5–4.6)
Potassium: 4 mmol/L (ref 3.5–5.1)
Sodium: 135 mmol/L (ref 135–145)

## 2022-12-22 LAB — BARTONELLA ANTIBODY PANEL
B Quintana IgM: NEGATIVE titer
B henselae IgG: NEGATIVE titer
B henselae IgM: NEGATIVE titer
B quintana IgG: NEGATIVE titer

## 2022-12-22 LAB — VANCOMYCIN, RANDOM: Vancomycin Rm: 21 ug/mL

## 2022-12-22 MED ORDER — ALTEPLASE 2 MG IJ SOLR: 2.0000 mg | Freq: Once | INTRAMUSCULAR | Status: DC | PRN

## 2022-12-22 MED ORDER — HEPARIN SODIUM (PORCINE) 1000 UNIT/ML DIALYSIS
3500.0000 [IU] | INTRAMUSCULAR | Status: DC | PRN
  Administered 2022-12-22: 3500 [IU] via INTRAVENOUS_CENTRAL
  Filled 2022-12-22 (×2): qty 4

## 2022-12-22 MED ORDER — SODIUM CHLORIDE 0.9 % IV SOLN
2.0000 g | INTRAVENOUS | Status: DC
  Administered 2022-12-22: 2 g via INTRAVENOUS
  Filled 2022-12-22: qty 12.5

## 2022-12-22 MED ORDER — PROSOURCE PLUS PO LIQD
30.0000 mL | Freq: Two times a day (BID) | ORAL | Status: DC
  Administered 2022-12-22 – 2022-12-23 (×2): 30 mL via ORAL
  Filled 2022-12-22 (×4): qty 30

## 2022-12-22 MED ORDER — ANTICOAGULANT SODIUM CITRATE 4% (200MG/5ML) IV SOLN: 5.0000 mL | Status: DC | PRN

## 2022-12-22 MED ORDER — HEPARIN SODIUM (PORCINE) 1000 UNIT/ML DIALYSIS: 1000.0000 [IU] | INTRAMUSCULAR | Status: DC | PRN

## 2022-12-22 MED ORDER — VANCOMYCIN HCL IN DEXTROSE 1-5 GM/200ML-% IV SOLN
1000.0000 mg | INTRAVENOUS | Status: DC
  Administered 2022-12-24: 1000 mg via INTRAVENOUS
  Filled 2022-12-22 (×2): qty 200

## 2022-12-22 MED ORDER — HEPARIN SODIUM (PORCINE) 1000 UNIT/ML IJ SOLN
INTRAMUSCULAR | Status: AC
  Administered 2022-12-22: 1000 [IU]
  Filled 2022-12-22: qty 1

## 2022-12-22 MED ORDER — DARBEPOETIN ALFA 25 MCG/0.42ML IJ SOSY
25.0000 ug | PREFILLED_SYRINGE | INTRAMUSCULAR | Status: DC
  Administered 2022-12-22: 25 ug via SUBCUTANEOUS
  Filled 2022-12-22: qty 0.42

## 2022-12-22 NOTE — TOC Progression Note (Signed)
Transition of Care [facility]) - Progression Note    Patient Details  Name: Heather [name] MRN: [id number] Date of Birth: 24-Jan-1971  [facility] CM/SW Contact  Jinger Neighbors, [place] Phone Number: 12/22/2022, 1:38 PM  Clinical Narrative:     CSW conversed with patient's husband regarding facility of choice. Pt's husband reports pt would like to go to [facility]. CSW attempted to call Kitty; no answer. CSW sent Perrin Smack a message making her aware of pt's bed choice.      Barriers to Discharge: SNF Pending bed offer  Expected Discharge Plan and Services                                               Social Determinants of Health (SDOH) Interventions SDOH Screenings   Food Insecurity: No Food Insecurity (12/15/2022)  Housing: Low Risk  (12/15/2022)  Transportation Needs: No Transportation Needs (12/15/2022)  Utilities: Not At Risk (12/15/2022)  Depression (PHQ2-9): Low Risk  (10/26/2022)  Financial Resource Strain: Low Risk  (10/26/2022)  Physical Activity: Inactive (10/26/2022)  Social Connections: Moderately Integrated (10/26/2022)  Stress: No Stress Concern Present (10/26/2022)  Tobacco Use: Low Risk  (12/20/2022)    Readmission Risk Interventions     No data to display

## 2022-12-22 NOTE — Progress Notes (Signed)
Physical Therapy Treatment Patient Details Name: Heather [name] MRN: [id number] DOB: 21-Oct-1971 Today's Date: 12/22/2022   History of Present Illness 51 year old admitted with new CVAs.  CT scan of the head with left paramedian frontal parietal region infarct.  MRI showed new 9 mm posterior left cerebral infarct.  Pt with PMH of cardiac arrest, stroke, ESRD on hemodialysis Monday Wednesday Friday, insulin-dependent type 2 diabetes with gastroparesis, PE and DVT on Eliquis, hypertension, right below-knee amputation who was brought from home with altered mental status and aphasia.  Hospitalization 10/21/2022-10/24/2022 with acute infarct left paramedian frontoparietal region.    PT Comments    Pt greeted spine in bed, A&O x4, pleasant and agreeable to session with good progress towards acute goals. Pt able to follow all commands and come to sitting EOB with increased time with min guard assist for safety. Pt able to self direct care and set up of WC for transfer bed<>WC and complete with light min assist to steady during squat pivot. Pt needing min assist to laterally scoot along EOB from foot to head. Session limited as pt fatigued for HD session and MD arriving. Current plan remains appropriate to address deficits and maximize functional independence and decrease caregiver burden. Pt continues to benefit from skilled PT services to progress toward functional mobility goals.    Recommendations for follow up therapy are one component of a multi-disciplinary discharge planning process, led by the attending physician.  Recommendations may be updated based on patient status, additional functional criteria and insurance authorization.  Follow Up Recommendations  Skilled nursing-short term rehab (<3 hours/day) Can patient physically be transported by private vehicle: No   Assistance Recommended at Discharge Frequent or constant Supervision/Assistance  Patient can return home with the following  Assist for transportation;Help with stairs or ramp for entrance;Two people to help with walking and/or transfers;Direct supervision/assist for medications management;Direct supervision/assist for financial management;Assistance with cooking/housework   Equipment Recommendations  Other (comment) (TBD)    Recommendations for Other Services       Precautions / Restrictions Precautions Precautions: Fall Precaution Comments: R BKA Restrictions Weight Bearing Restrictions: No RLE Weight Bearing:  (BKA)     Mobility  Bed Mobility Overal bed mobility: Needs Assistance Bed Mobility: Supine to Sit     Supine to sit: Min guard Sit to supine: Min guard   General bed mobility comments: min guard for safety Patient Response: Cooperative  Transfers Overall transfer level: Needs assistance Equipment used: None Transfers: Bed to chair/wheelchair/BSC       Squat pivot transfers: Min assist    Lateral/Scoot Transfers: Min guard General transfer comment: min guard with light asssit to steady during trasnfer bed<>WC via squat pivot, assist to scoot from foot of bed along edge to [facility] secondary to fatigue    Ambulation/Gait                   Stairs             Wheelchair Mobility    Modified Rankin (Stroke Patients Only) Modified Rankin (Stroke Patients Only) Pre-Morbid Rankin Score: Slight disability Modified Rankin: Moderately severe disability     Balance Overall balance assessment: Needs assistance Sitting-balance support: Single extremity supported Sitting balance-Leahy Scale: Good Sitting balance - Comments: Pt able to maintain balance while donning gripper sock     Standing balance-Leahy Scale: Poor Standing balance comment: external assist needed  Cognition Arousal/Alertness: Awake/alert Behavior During Therapy: WFL for tasks assessed/performed Overall Cognitive Status: Within Functional Limits for tasks  assessed                                          Exercises      General Comments General comments (skin integrity, edema, etc.): VSS, pt awake alert and particiapatory throughout session      Pertinent Vitals/Pain Pain Assessment Pain Assessment: No/denies pain Pain Intervention(s): Monitored during session    Home Living                          Prior Function            PT Goals (current goals can now be found in the care plan section) Acute Rehab PT Goals PT Goal Formulation: With family Time For Goal Achievement: 12/28/22 Progress towards PT goals: Progressing toward goals    Frequency    Min 2X/week      PT Plan      Co-evaluation              AM-PAC PT "6 Clicks" Mobility   Outcome Measure  Help needed turning from your back to your side while in a flat bed without using bedrails?: A Little Help needed moving from lying on your back to sitting on the side of a flat bed without using bedrails?: A Little Help needed moving to and from a bed to a chair (including a wheelchair)?: A Little Help needed standing up from a chair using your arms (e.g., wheelchair or bedside chair)?: A Lot Help needed to walk in hospital room?: Total Help needed climbing 3-5 steps with a railing? : Total 6 Click Score: 13    End of Session Equipment Utilized During Treatment: Gait belt Activity Tolerance: Patient tolerated treatment well Patient left: in bed;with call bell/phone within reach;with bed alarm set;with nursing/sitter in room;Other (comment);with family/visitor present (with MD present) Nurse Communication: Mobility status PT Visit Diagnosis: Muscle weakness (generalized) (M62.81);Difficulty in walking, not elsewhere classified (R26.2);Other symptoms and signs involving the nervous system (R29.898) Hemiplegia - Right/Left: Right Hemiplegia - dominant/non-dominant: Dominant Hemiplegia - caused by: Cerebral infarction     Time:  6812-7517 PT Time Calculation (min) (ACUTE ONLY): 17 min  Charges:  $Therapeutic Activity: 8-22 mins                     Dena Esperanza R. PTA Acute Rehabilitation Services Office: [place] 12/22/2022, 1:58 PM

## 2022-12-22 NOTE — Progress Notes (Signed)
PT Cancellation Note  Patient Details Name: Heather [name] MRN: [id number] DOB: [birth year]   Cancelled Treatment:    Reason Eval/Treat Not Completed: (P) Patient at procedure or test/unavailable, at HD. Will check back as schedule allows to continue with PT POC.  Audry Riles. PTA Acute Rehabilitation Services Office: [place] 12/22/2022, 10:04 AM

## 2022-12-22 NOTE — Progress Notes (Signed)
[facility] Progress Note   Subjective: Seen on HD. Wants to go home. No C/Os.      Objective Vitals:   12/22/22 0400 12/22/22 0817 12/22/22 0851 12/22/22 0907  BP: 138/65 (!) 159/70 (!) 168/88 (!) 144/82  Pulse: 88 89 95 88  Resp: '14 16 15 16  '$ Temp: 98.4 F (36.9 C) 98.1 F (36.7 C) 98.3 F (36.8 C)   TempSrc: Oral Oral    SpO2: 97% 96% 96% 98%  Weight:   102.5 kg   Height:       Physical Exam General: Pleasant WN female in NAD Heart: S1,S2 RRR SR on monitor No M/R/G Lungs: CTAB A/P Abdomen: NABS, NT Extremities: R BKA no stump edema, no LLE edema Dialysis Access: Permanent TDC blood lines connected    Additional Objective Labs: Basic Metabolic Panel: Recent Labs  Lab 12/20/22 0327 12/21/22 0455 12/22/22 0119  NA 134* 133* 135  K 4.5 4.1 4.0  CL 97* 100 99  CO2 21* 20* 24  GLUCOSE 166* 126* 163*  BUN 30* 47* 56*  CREATININE 7.35* 9.97* 11.50*  CALCIUM 8.9 9.3 9.6  PHOS 4.8* 5.4* 5.3*   Liver Function Tests: Recent Labs  Lab 12/20/22 0327 12/21/22 0455 12/22/22 0119  ALBUMIN 2.8* 2.8* 2.9*   No results for input(s): "LIPASE", "AMYLASE" in the last 168 hours. CBC: Recent Labs  Lab 12/16/22 0500 12/17/22 0642 12/18/22 0258 12/19/22 0257 12/22/22 0119  WBC 7.6 8.0 6.1 6.9 6.8  NEUTROABS 4.9 5.4 3.4 3.5  --   HGB 10.5* 10.2* 9.7* 9.4* 9.2*  HCT 32.5* 31.2* 30.0* 30.3* 30.0*  MCV 93.7 93.4 94.0 95.9 97.4  PLT 150 158 147* 167 169   Blood Culture    Component Value Date/Time   SDES BLOOD RIGHT HAND 12/14/2022 0408   SPECREQUEST  12/14/2022 0408    BOTTLES DRAWN AEROBIC ONLY Blood Culture results may not be optimal due to an inadequate volume of blood received in culture bottles   CULT  12/14/2022 0408    NO GROWTH 5 DAYS Performed at [facility] [address] [place], [place] [address]    REPTSTATUS 12/19/2022 FINAL 12/14/2022 0408    Cardiac Enzymes: No results for input(s): "CKTOTAL", "CKMB", "CKMBINDEX",  "TROPONINI" in the last 168 hours. CBG: Recent Labs  Lab 12/21/22 1215 12/21/22 1704 12/21/22 2146 12/22/22 0605 12/22/22 0809  GLUCAP 160* 159* 163* 175* 181*   Iron Studies: No results for input(s): "IRON", "TIBC", "TRANSFERRIN", "FERRITIN" in the last 72 hours. '@lablastinr3'$ @ Studies/Results: No results found. Medications:  ceFEPime (MAXIPIME) IV 1 g (12/21/22 2250)    aspirin  81 mg Oral Daily   atorvastatin  80 mg Oral QHS   calcium acetate  1,334 mg Oral TID WC   Chlorhexidine Gluconate Cloth  6 each Topical Q0600   Chlorhexidine Gluconate Cloth  6 each Topical Q0600   clopidogrel  75 mg Oral Daily   doxercalciferol  4 mcg Intravenous Q M,W,F-HD   heparin  5,000 Units Subcutaneous Q8H   insulin aspart  0-15 Units Subcutaneous TID PC & HS   sodium chloride flush  10-40 mL Intracatheter Q12H   vancomycin variable dose per unstable renal function (pharmacist dosing)   Does not apply See admin instructions     OP HD: HD MWF  3h 19mn  400/700  102.3kg  2/2 bath  TDC   - Heparin 7000 units IV TIW - venofer '50mg'$   IV weekly - hectorol 476m IV q HD  CXR 12/17 - no active disease   Assessment/Plan:  New embolic stroke: hx of recent endocarditis. HD center did have to use ceftaz at one point due to cefepime shortage but appears she otherwise completed her IV abx. Repeat echo here showed mass on posterior mitral valve. 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  ID following, cont IV abx for now while looking into other possible atypical infectious causes and non-infectious causes.  Acute encephalopathy: new embolic stroke on MRI but also took baclofen which is contraindicated in ESRD. Stopped baclofen. Thankfully she is now awake, alert, oriented X 3.   ESRD -  HD MWF. HD today on schedule. Next HD 12/24/2022  BP/volume - Bp significantly elevated on arrival, then dropped to normal  and low in the 90s. Started IVF"s and wt's are up and BP is better at 130/80 today. IV fluid dc'd. Amlodipine on hold.   Anemia:9.2 Low dose ESA today.    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 Continue phoslo 2- 3 ac as binder and cont IV vdra.   Nutrition:  Renal Carb mod diet. Albumin low. Start protein supplements.  T2DM: management per primary team  Jimmye Norman. Harshaan Whang NP-C 12/22/2022, 9:18 AM  [employer] [phone]

## 2022-12-22 NOTE — Progress Notes (Signed)
   12/22/22 1239  Vitals  Temp 98.1 F (36.7 C)  Temp Source Oral  BP (!) 165/86  MAP (mmHg) 109  BP Location Left Arm  BP Method Automatic  Patient Position (if appropriate) Lying  Pulse Rate 87  Pulse Rate Source Monitor  ECG Heart Rate 87  Resp 15  Oxygen Therapy  SpO2 99 %  O2 Device Room Air  Patient Activity (if Appropriate) In bed  Pulse Oximetry Type Continuous   Received patient in bed to unit.  Alert and oriented.  Informed consent signed and in chart.   Treatment initiated: 0907 Treatment completed: 1225  Patient tolerated well.  Transported back to the room  Alert, without acute distress.  Hand-off given to patient's nurse.   Access used: HD cath Access issues: NA  Total UF removed: 0 (order was to keep even) Medication(s) given: Heparin 3500 units bolus at beginning of tx, Heparin 3500 units bolus at mid HD tx, Hectorol 1mg IVP, Tylenol '650mg'$  po, Heparin Dwells 3200 units Post HD VS: see above Post HD weight: 102.5kg   [facility]

## 2022-12-22 NOTE — Progress Notes (Signed)
Speech Language Pathology Treatment: Cognitive-Linquistic  Patient Details Name: Heather [name] MRN: [id number] DOB: [birth year] Today's Date: 12/22/2022 Time: 4034-7425 SLP Time Calculation (min) (ACUTE ONLY): 20 min  Assessment / Plan / Recommendation Clinical Impression  Patient seen by SLP for skilled treatment focused on cognitive-linguistic function. Patient's spouse in room as well. During unstructured conversation, patient is significantly more alert, attentive and communicative than when seen by this SLP a week ago. SLP administered the SLUMS examination and patient's score of 25 out of 30 places her in the scoring category of Mild Neurocognitive Disorder (scores 21-26). Patient did appear to work to the best of her ability and she spontaneously corrected herself when having some difficulty drawing the hands on the clock. SLP recommends skilled intervention at next venue of care and will follow as able while admitted in hospital.    HPI HPI: 51 year old presenting 12/17 with AMS and trouble speaking. [name] admitted with new CVAs. CT scan of the head with left paramedian frontal parietal region infarct. MRI showed new 9 mm posterior left cerebral infarct. Hospitalization 10/21/2022-10/24/2022 with acute infarct left paramedian frontoparietal region (scored 24/30 on SLUMS). PMH of cardiac arrest, stroke, GERD, upper airway cough syndrome, PNA, ESRD on HD MWF, insulin-dependent type 2 diabetes with gastroparesis, PE and DVT on Eliquis, HTN, right BKA      SLP Plan  Continue with current plan of care      Recommendations for follow up therapy are one component of a multi-disciplinary discharge planning process, led by the attending physician.  Recommendations may be updated based on patient status, additional functional criteria and insurance authorization.    Recommendations  Diet recommendations: Regular;Thin liquid Liquids provided via: Cup;Straw Medication Administration:  Whole meds with puree Supervision: Patient able to self feed;Intermittent supervision to cue for compensatory strategies Compensations: Slow rate;Small sips/bites;Minimize environmental distractions Postural Changes and/or Swallow Maneuvers: Seated upright 90 degrees                Oral Care Recommendations: Oral care BID Follow Up Recommendations: Skilled nursing-short term rehab (<3 hours/day) Assistance recommended at discharge: Frequent or constant Supervision/Assistance SLP Visit Diagnosis: Cognitive communication deficit (Z56.387) Plan: Continue with current plan of care          Sonia Baller, MA, CCC-SLP Speech Therapy

## 2022-12-22 NOTE — Progress Notes (Signed)
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  MRI brain on admission was positive for new possible embolic right occipital infarcts when compared to October.  Neurology was consulted.  She has had issues with encephalopathy, aphasia and dysphagia which is slowly improving.  Nephrology consulted to continue dialysis.  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  Cardiology was consulted for TEE and ID was consulted.  She underwent TEE on 12/16/2022  Assessment & Plan:   Acute ischemic right occipital infarcts, possibly embolic/Acute metabolic encephalopathy -Presented with encephalopathy, aphasia and was nonverbal -MRI brain showed new right occipital ischemic infarcts possibly from the valvular mass/vegetation.   Neurology recommended aspirin and Plavix for 3 weeks then aspirin alone. -A1c 7.4.  LDL 135.  Continue Lipitor. -EEG negative for seizures.  LTM EEG negative for seizures.   Mental status has improved.  Continue to monitor. SNF recommended by PT OT.   Stable from a neurological standpoint.  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   There is a note from Dr. Kipp Brood with cardiothoracic surgery on 12/22.  According to the note there is no plan for surgical intervention.    Patient and husband awaiting to discuss with cardiothoracic team once available. Currently on cefepime and vancomycin as per ID -appreciate insight recommendations Remains afebrile.  End-stage renal disease on hemodialysis -Nephrology following.  Dialysis as per nephrology Patient concerned about low blood pressures with dialysis.  Apparently drop in blood pressure impairs her hearing.  Patient not noted to be on any antihypertensives.  She was reassured.  We will continue to monitor.  History of PVD status post right BKA Currently wheelchair-bound  Anemia of chronic disease Hemoglobin stable.  Monitor intermittently.  Hyponatremia Stable  Thrombocytopenia Questionable cause.  Resolved  Diabetes mellitus type 2 with hyperglycemia A1c 7.9.  Continue CBGs with SSI  Goals of care Palliative care consultation appreciated.  Currently remains full code.  DVT prophylaxis: Heparin subcutaneous Code Status: Full Family Communication: Husband at bedside Disposition Plan: SNF when medically stable pending further evaluation or workup from ID or cardiothoracic surgery  Status is: Inpatient Remains inpatient appropriate because: Acute stroke, infective endocarditis  Consultants: Nephrology/neurology/cardiothoracic surgery/ID.  Palliative care  Procedures: As above  Antimicrobials:  Anti-infectives (From admission, onward)    Start     Dose/Rate Route Frequency Ordered Stop   12/21/22 0721  vancomycin variable dose per unstable renal function (pharmacist dosing)         Does not apply See admin instructions 12/21/22 0721     12/19/22 1200  vancomycin (VANCOCIN) IVPB 1000 mg/200 mL premix        1,000 mg 200 mL/hr over 60 Minutes Intravenous Once in dialysis 12/19/22 0828 12/19/22 1631   12/17/22 0900  vancomycin (VANCOREADY) IVPB 500 mg/100 mL  Status:  Discontinued        500 mg  100 mL/hr over 60 Minutes Intravenous  Once 12/17/22 0812 12/17/22 0816   12/16/22 1200  vancomycin  (VANCOCIN) IVPB 1000 mg/200 mL premix  Status:  Discontinued        1,000 mg 200 mL/hr over 60 Minutes Intravenous  Once 12/15/22 1228 12/17/22 0812   12/15/22 1200  vancomycin (VANCOCIN) IVPB 1000 mg/200 mL premix  Status:  Discontinued        1,000 mg 200 mL/hr over 60 Minutes Intravenous Every M-W-F (Hemodialysis) 12/13/22 1823 12/15/22 1228   12/14/22 2200  ceFEPIme (MAXIPIME) 1 g in sodium chloride 0.9 % 100 mL IVPB        1 g 200 mL/hr over 30 Minutes Intravenous Every 24 hours 12/14/22 1322     12/14/22 1500  vancomycin (VANCOCIN) IVPB 1000 mg/200 mL premix        1,000 mg 200 mL/hr over 60 Minutes Intravenous  Once 12/14/22 1329 12/14/22 1532   12/13/22 2200  piperacillin-tazobactam (ZOSYN) IVPB 2.25 g  Status:  Discontinued        2.25 g 100 mL/hr over 30 Minutes Intravenous Every 8 hours 12/13/22 1820 12/14/22 1322   12/13/22 1830  vancomycin (VANCOCIN) 2,250 mg in sodium chloride 0.9 % 500 mL IVPB        2,250 mg 261.3 mL/hr over 120 Minutes Intravenous  Once 12/13/22 1820 12/14/22 0115        Subjective: Denies any complaints.  Hearing has improved.   Objective: Vitals:   12/21/22 1601 12/21/22 1930 12/21/22 2333 12/22/22 0400  BP: (!) 142/62  133/74 138/65  Pulse: 69 87 83 88  Resp: '15 16 18 14  '$ Temp: 97.9 F (36.6 C) 98 F (36.7 C) 98.4 F (36.9 C) 98.4 F (36.9 C)  TempSrc: Axillary Oral Axillary Oral  SpO2:  96% 94% 97%  Weight:      Height:        Intake/Output Summary (Last 24 hours) at 12/22/2022 0806 Last data filed at 12/22/2022 0000 Gross per 24 hour  Intake 200 ml  Output --  Net 200 ml    Filed Weights   12/16/22 2152 12/19/22 1306 12/19/22 1651  Weight: 98.8 kg 101.5 kg 99.9 kg    Examination:  General appearance: Awake alert.  In no distress Resp: Clear to auscultation bilaterally.  Normal effort Cardio: S1-S2 is normal regular.  No S3-S4.  No rubs murmurs or bruit GI: Abdomen is soft.  Nontender nondistended.  Bowel sounds are  present normal.  No masses organomegaly  Data Reviewed: I have personally reviewed following labs and imaging studies  CBC: Recent Labs  Lab 12/16/22 0500 12/17/22 0642 12/18/22 0258 12/19/22 0257 12/22/22 0119  WBC 7.6 8.0 6.1 6.9 6.8  NEUTROABS 4.9 5.4 3.4 3.5  --   HGB 10.5* 10.2* 9.7* 9.4* 9.2*  HCT 32.5* 31.2* 30.0* 30.3* 30.0*  MCV 93.7 93.4 94.0 95.9 97.4  PLT 150 158 147* 167 798    Basic Metabolic Panel: Recent Labs  Lab 12/16/22 0500 12/17/22 0642 12/18/22 0258 12/19/22 0257 12/20/22 0327 12/21/22 0455 12/22/22 0119  NA 133* 135 136 135 134* 133* 135  K 4.7 3.9 3.9 4.1 4.5 4.1 4.0  CL 96* 92* 96* 97* 97* 100 99  CO2 '23 26 23 26 '$ 21* 20* 24  GLUCOSE 99 107* 147* 139* 166* 126* 163*  BUN 47* 30* 41* 52* 30* 47* 56*  CREATININE 10.84* 8.09* 10.10* 12.17* 7.35* 9.97* 11.50*  CALCIUM 9.1 9.6 9.4 9.0 8.9 9.3 9.6  MG  2.3 2.1 2.0 2.2  --   --   --   PHOS 7.7* 5.0* 7.2* 7.6* 4.8* 5.4* 5.3*    GFR: Estimated Creatinine Clearance: 7.1 mL/min (A) (by C-G formula based on SCr of 11.5 mg/dL (H)).  Liver Function Tests: Recent Labs  Lab 12/18/22 0258 12/19/22 0257 12/20/22 0327 12/21/22 0455 12/22/22 0119  ALBUMIN 3.1* 2.9* 2.8* 2.8* 2.9*      CBG: Recent Labs  Lab 12/21/22 0641 12/21/22 1215 12/21/22 1704 12/21/22 2146 12/22/22 0605  GLUCAP 154* 160* 159* 163* 175*      Recent Results (from the past 240 hour(s))  Resp panel by RT-PCR (RSV, Flu A&B, Covid) Anterior Nasal Swab     Status: None   Collection Time: 12/12/22  1:57 PM   Specimen: Anterior Nasal Swab  Result Value Ref Range Status   SARS Coronavirus 2 by RT PCR NEGATIVE NEGATIVE Final    Comment: (NOTE) SARS-CoV-2 target nucleic acids are NOT DETECTED.  The SARS-CoV-2 RNA is generally detectable in upper respiratory specimens during the acute phase of infection. The lowest concentration of SARS-CoV-2 viral copies this assay can detect is 138 copies/mL. A negative result does not  preclude SARS-Cov-2 infection and should not be used as the sole basis for treatment or other patient management decisions. A negative result may occur with  improper specimen collection/handling, submission of specimen other than nasopharyngeal swab, presence of viral mutation(s) within the areas targeted by this assay, and inadequate number of viral copies(<138 copies/mL). A negative result must be combined with clinical observations, patient history, and epidemiological information. The expected result is Negative.  Fact Sheet for Patients:  EntrepreneurPulse.com.au  Fact Sheet for Healthcare Providers:  IncredibleEmployment.be  This test is no t yet approved or cleared by the [place] FDA and  has been authorized for detection and/or diagnosis of SARS-CoV-2 by FDA under an Emergency Use Authorization (EUA). This EUA will remain  in effect (meaning this test can be used) for the duration of the COVID-19 declaration under Section 564(b)(1) of the Act, 21 U.S.C.section 360bbb-3(b)(1), unless the authorization is terminated  or revoked sooner.       Influenza A by PCR NEGATIVE NEGATIVE Final   Influenza B by PCR NEGATIVE NEGATIVE Final    Comment: (NOTE) The Xpert Xpress SARS-CoV-2/FLU/RSV plus assay is intended as an aid in the diagnosis of influenza from Nasopharyngeal swab specimens and should not be used as a sole basis for treatment. Nasal washings and aspirates are unacceptable for Xpert Xpress SARS-CoV-2/FLU/RSV testing.  Fact Sheet for Patients: EntrepreneurPulse.com.au  Fact Sheet for Healthcare Providers: IncredibleEmployment.be  This test is not yet approved or cleared by the [place] FDA and has been authorized for detection and/or diagnosis of SARS-CoV-2 by FDA under an Emergency Use Authorization (EUA). This EUA will remain in effect (meaning this test can be used) for the  duration of the COVID-19 declaration under Section 564(b)(1) of the Act, 21 U.S.C. section 360bbb-3(b)(1), unless the authorization is terminated or revoked.     Resp Syncytial Virus by PCR NEGATIVE NEGATIVE Final    Comment: (NOTE) Fact Sheet for Patients: EntrepreneurPulse.com.au  Fact Sheet for Healthcare Providers: IncredibleEmployment.be  This test is not yet approved or cleared by the [place] FDA and has been authorized for detection and/or diagnosis of SARS-CoV-2 by FDA under an Emergency Use Authorization (EUA). This EUA will remain in effect (meaning this test can be used) for the duration of the COVID-19 declaration under Section 564(b)(1) of the  Act, 21 U.S.C. section 360bbb-3(b)(1), unless the authorization is terminated or revoked.  Performed at [facility], [place] [address] [place], [place] [address]   Culture, blood (Routine X 2) w Reflex to ID Panel     Status: None   Collection Time: 12/13/22  8:55 PM   Specimen: BLOOD RIGHT ARM  Result Value Ref Range Status   Specimen Description BLOOD RIGHT ARM  Final   Special Requests   Final    BOTTLES DRAWN AEROBIC ONLY Blood Culture results may not be optimal due to an inadequate volume of blood received in culture bottles   Culture   Final    NO GROWTH 5 DAYS Performed at [facility], [place] [address], [place], [place] [address]    Report Status 12/18/2022 FINAL  Final  Culture, blood (Routine X 2) w Reflex to ID Panel     Status: None   Collection Time: 12/13/22  9:15 PM   Specimen: BLOOD LEFT ARM  Result Value Ref Range Status   Specimen Description BLOOD LEFT ARM  Final   Special Requests   Final    BOTTLES DRAWN AEROBIC AND ANAEROBIC Blood Culture results may not be optimal due to an inadequate volume of blood received in culture bottles   Culture   Final    NO GROWTH 5 DAYS Performed at [facility], [place] [address] [place], [place] [address]     Report Status 12/18/2022 FINAL  Final  Culture, blood (Routine X 2) w Reflex to ID Panel     Status: None   Collection Time: 12/14/22  3:55 AM   Specimen: BLOOD LEFT HAND  Result Value Ref Range Status   Specimen Description BLOOD LEFT HAND  Final   Special Requests   Final    BOTTLES DRAWN AEROBIC ONLY Blood Culture results may not be optimal due to an inadequate volume of blood received in culture bottles   Culture   Final    NO GROWTH 5 DAYS Performed at [facility], [place] [address], [place], [place] [address]    Report Status 12/19/2022 FINAL  Final  Culture, blood (Routine X 2) w Reflex to ID Panel     Status: None   Collection Time: 12/14/22  4:08 AM   Specimen: BLOOD RIGHT HAND  Result Value Ref Range Status   Specimen Description BLOOD RIGHT HAND  Final   Special Requests   Final    BOTTLES DRAWN AEROBIC ONLY Blood Culture results may not be optimal due to an inadequate volume of blood received in culture bottles   Culture   Final    NO GROWTH 5 DAYS Performed at [facility], [place] [address] [place],  [address]    Report Status 12/19/2022 FINAL  Final     Radiology Studies: No results found.   Scheduled Meds:  aspirin  81 mg Oral Daily   atorvastatin  80 mg Oral QHS   calcium acetate  1,334 mg Oral TID WC   Chlorhexidine Gluconate Cloth  6 each Topical Q0600   Chlorhexidine Gluconate Cloth  6 each Topical Q0600   clopidogrel  75 mg Oral Daily   doxercalciferol  4 mcg Intravenous Q M,W,F-HD   heparin  5,000 Units Subcutaneous Q8H   insulin aspart  0-15 Units Subcutaneous TID PC & HS   sodium chloride flush  10-40 mL Intracatheter Q12H   vancomycin variable dose per unstable renal function (pharmacist dosing)   Does  not apply See admin instructions   Continuous Infusions:  ceFEPime (MAXIPIME) IV 1 g (12/21/22 2250)   Time spent: 25 minutes   Little Ishikawa, DO [facility] 12/22/2022, 8:06 AM

## 2022-12-22 NOTE — Plan of Care (Signed)
  Problem: Education: Goal: Knowledge of disease or condition will improve Outcome: Progressing Goal: Knowledge of secondary prevention will improve (MUST DOCUMENT ALL) Outcome: Progressing Goal: Knowledge of patient specific risk factors will improve (Mark N/A or DELETE if not current risk factor) Outcome: Progressing   Problem: Ischemic Stroke/TIA Tissue Perfusion: Goal: Complications of ischemic stroke/TIA will be minimized Outcome: Progressing   Problem: Coping: Goal: Will verbalize positive feelings about self Outcome: Progressing Goal: Will identify appropriate support needs Outcome: Progressing   Problem: Health Behavior/Discharge Planning: Goal: Ability to manage health-related needs will improve Outcome: Progressing Goal: Goals will be collaboratively established with patient/family Outcome: Progressing   Problem: Self-Care: Goal: Ability to participate in self-care as condition permits will improve Outcome: Progressing Goal: Verbalization of feelings and concerns over difficulty with self-care will improve Outcome: Progressing Goal: Ability to communicate needs accurately will improve Outcome: Progressing   Problem: Nutrition: Goal: Risk of aspiration will decrease Outcome: Progressing Goal: Dietary intake will improve Outcome: Progressing   Problem: Education: Goal: Ability to describe self-care measures that may prevent or decrease complications (Diabetes Survival Skills Education) will improve Outcome: Progressing Goal: Individualized Educational Video(s) Outcome: Progressing   Problem: Coping: Goal: Ability to adjust to condition or change in health will improve Outcome: Progressing   Problem: Fluid Volume: Goal: Ability to maintain a balanced intake and output will improve Outcome: Progressing   Problem: Health Behavior/Discharge Planning: Goal: Ability to identify and utilize available resources and services will improve Outcome:  Progressing Goal: Ability to manage health-related needs will improve Outcome: Progressing   Problem: Metabolic: Goal: Ability to maintain appropriate glucose levels will improve Outcome: Progressing   Problem: Nutritional: Goal: Maintenance of adequate nutrition will improve Outcome: Progressing Goal: Progress toward achieving an optimal weight will improve Outcome: Progressing   Problem: Skin Integrity: Goal: Risk for impaired skin integrity will decrease Outcome: Progressing   Problem: Tissue Perfusion: Goal: Adequacy of tissue perfusion will improve Outcome: Progressing   Problem: Education: Goal: Knowledge of General Education information will improve Description: Including pain rating scale, medication(s)/side effects and non-pharmacologic comfort measures Outcome: Progressing   Problem: Health Behavior/Discharge Planning: Goal: Ability to manage health-related needs will improve Outcome: Progressing   Problem: Clinical Measurements: Goal: Ability to maintain clinical measurements within normal limits will improve Outcome: Progressing Goal: Will remain free from infection Outcome: Progressing Goal: Diagnostic test results will improve Outcome: Progressing Goal: Respiratory complications will improve Outcome: Progressing Goal: Cardiovascular complication will be avoided Outcome: Progressing   Problem: Activity: Goal: Risk for activity intolerance will decrease Outcome: Progressing   Problem: Nutrition: Goal: Adequate nutrition will be maintained Outcome: Progressing   Problem: Coping: Goal: Level of anxiety will decrease Outcome: Progressing   Problem: Elimination: Goal: Will not experience complications related to bowel motility Outcome: Progressing Goal: Will not experience complications related to urinary retention Outcome: Progressing   Problem: Pain Managment: Goal: General experience of comfort will improve Outcome: Progressing   Problem:  Safety: Goal: Ability to remain free from injury will improve Outcome: Progressing   Problem: Skin Integrity: Goal: Risk for impaired skin integrity will decrease Outcome: Progressing   

## 2022-12-22 NOTE — Progress Notes (Signed)
Pharmacy Antibiotic Note  Heather [name] is a 51 y.o. 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  Pharmacy has been consulted for Vancomycin + Cefepime dosing due to concern for persistent culture negative IE.   ESRD-MWF however received HD off-schedule yesterday (12/24) per holiday schedule. Anticipated next HD is Wednesday 12/27. Patient received Vancomycin 1,000 mg at end of HD session 12/24. Documented length of HD session 3.5h at BFR 400.   Of note, patient had previously opted not to receive CTS intervention, but is now considering surgery and asking to speak with Dr. Kipp Brood again.   Pt got HD today. Last charted vanc dose was on 12/24. A radom vanc level was collected post HD and came back in goal of 21. We will put standing dose back for plan MWF HD schedule.  Plan: - Vancomycin 1,000 mg IV MWF - start 12/29 - Change cefepime to 2g IV MWF - Will continue to follow HD schedule/duration, culture results, LOT, and antibiotic de-escalation plans  Height: '5\' 9"'$  (175.3 cm) Weight: 102.5 kg (225 lb 15.5 oz) IBW/kg (Calculated) : 66.2  Temp (24hrs), Avg:98.2 F (36.8 C), Min:98 F (36.7 C), Max:98.4 F (36.9 C)  Recent Labs  Lab 12/16/22 0500 12/17/22 0642 12/17/22 0931 12/18/22 0258 12/19/22 0257 12/20/22 0327 12/21/22 0455 12/22/22 0119 12/22/22 1432  WBC 7.6 8.0  --  6.1 6.9  --   --  6.8  --   CREATININE 10.84* 8.09*  --  10.10* 12.17* 7.35* 9.97* 11.50*  --   VANCORANDOM  --   --  31  --   --   --   --   --  21     Estimated Creatinine Clearance: 7.2 mL/min (A) (by C-G formula based on SCr of 11.5 mg/dL (H)).    Allergies  Allergen Reactions   Ibuprofen Other (See Comments)    CKD stage 3. Should avoid.   Dilaudid [Hydromorphone Hcl] Itching and Other (See Comments)    Can take with Benadryl.   Iodinated Contrast Media Rash   Tramadol Itching    Antimicrobials this admission: Vanc 12/18 >> Zosyn 12/19 x 1 Cefepime 12/19 >>  Dose adjustments  this admission: N/a  Microbiology results: 12/17 COVID/flu >> neg 12/18 BCx >> negF 12/19 BCx >>negF  Onnie Boer, PharmD, BCIDP, AAHIVP, CPP Infectious Disease Pharmacist 12/22/2022 4:11 PM

## 2022-12-23 DIAGNOSIS — I639 Cerebral infarction, unspecified: Secondary | ICD-10-CM | POA: Diagnosis not present

## 2022-12-23 DIAGNOSIS — Z515 Encounter for palliative care: Secondary | ICD-10-CM | POA: Diagnosis not present

## 2022-12-23 DIAGNOSIS — I5189 Other ill-defined heart diseases: Secondary | ICD-10-CM | POA: Diagnosis not present

## 2022-12-23 DIAGNOSIS — N186 End stage renal disease: Secondary | ICD-10-CM | POA: Diagnosis not present

## 2022-12-23 LAB — RENAL FUNCTION PANEL
Albumin: 2.8 g/dL — ABNORMAL LOW (ref 3.5–5.0)
Anion gap: 13 (ref 5–15)
BUN: 34 mg/dL — ABNORMAL HIGH (ref 6–20)
CO2: 21 mmol/L — ABNORMAL LOW (ref 22–32)
Calcium: 9 mg/dL (ref 8.9–10.3)
Chloride: 98 mmol/L (ref 98–111)
Creatinine, Ser: 7.22 mg/dL — ABNORMAL HIGH (ref 0.44–1.00)
GFR, Estimated: 6 mL/min — ABNORMAL LOW (ref >60)
Glucose, Bld: 139 mg/dL — ABNORMAL HIGH (ref 70–99)
Phosphorus: 3.5 mg/dL (ref 2.5–4.6)
Potassium: 3.6 mmol/L (ref 3.5–5.1)
Sodium: 132 mmol/L — ABNORMAL LOW (ref 135–145)

## 2022-12-23 LAB — DRVVT CONFIRM: dRVVT Confirm: 1.2 ratio (ref 0.8–1.2)

## 2022-12-23 LAB — GLUCOSE, CAPILLARY
Glucose-Capillary: 130 mg/dL — ABNORMAL HIGH (ref 70–99)
Glucose-Capillary: 189 mg/dL — ABNORMAL HIGH (ref 70–99)
Glucose-Capillary: 128 mg/dL — ABNORMAL HIGH (ref 70–99)
Glucose-Capillary: 118 mg/dL — ABNORMAL HIGH (ref 70–99)

## 2022-12-23 LAB — ANTIPHOSPHOLIPID SYNDROME EVAL, BLD
Anticardiolipin IgA: <9 APL U/mL (ref 0–11)
Anticardiolipin IgG: <9 GPL U/mL (ref 0–14)
Anticardiolipin IgM: <9 MPL U/mL (ref 0–12)
DRVVT: 51.2 s — ABNORMAL HIGH (ref 0.0–47.0)
PTT Lupus Anticoagulant: 39.9 s (ref 0.0–43.5)
Phosphatydalserine, IgA: 1 APS Units (ref 0–19)
Phosphatydalserine, IgG: 9 Units (ref 0–30)
Phosphatydalserine, IgM: <10 Units (ref 0–30)

## 2022-12-23 LAB — DRVVT MIX: dRVVT Mix: 44.5 s — ABNORMAL HIGH (ref 0.0–40.4)

## 2022-12-23 NOTE — Progress Notes (Signed)
Advised by CSW that pt will likely d/c to snf tomorrow. SNF is able to accommodate pt's HD schedule. Called [facility] to make clinic aware of pt's possible d/c to snf tomorrow. Pt will need to go to clinic on Sunday due to clinic being closed on Monday for holiday. This info was provided to CSW to provide to snf. Will assist as needed.   Melven Sartorius Renal Navigator [phone]

## 2022-12-23 NOTE — Plan of Care (Signed)
  Problem: Education: Goal: Knowledge of disease or condition will improve Outcome: Progressing   Problem: Coping: Goal: Will identify appropriate support needs Outcome: Progressing   Problem: Self-Care: Goal: Ability to participate in self-care as condition permits will improve Outcome: Progressing   Problem: Nutrition: Goal: Risk of aspiration will decrease Outcome: Progressing Goal: Dietary intake will improve Outcome: Progressing   Problem: Nutritional: Goal: Maintenance of adequate nutrition will improve Outcome: Progressing   Problem: Activity: Goal: Risk for activity intolerance will decrease Outcome: Progressing   Problem: Nutrition: Goal: Adequate nutrition will be maintained Outcome: Progressing

## 2022-12-23 NOTE — Progress Notes (Signed)
Patient ID: [name], female   DOB: 1971/11/21, 51 y.o.   MRN: [id number]    Progress Note from the Palliative Medicine Team at [facility]   Patient Name: Heather [name]        Date: 12/23/2022 DOB: July 04, 1971  Age: 51 y.o. MRN#: [id number] Attending Physician: Little Ishikawa, MD Primary Care Physician: [name], MD Admit Date: 12/12/2022   Medical records reviewed, assessed patient, husband at bedsdie   51 y.o. female  with past medical history of long-standing insulin dependent T2DM, ESRD on HD, cardiac arrest, CVA, PE/DVT on Eliquis, s/p right BKA. 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 Admitted on 12/12/2022 with stroke-like symptoms. Husband reports finding Heather [name] sitting in her wheelchair at their front door confused and weak early morning on day of admission.   Neurology consulted. MRI reveals acute ischemic right occipital infarcts, possibly embolic. EEG being completed 12/16/2022.   Echocardiogram concerning for mass attached to posterior mitral valve--TEE ordered for today 12/16/2022. Completed extended course of antibiotics last month for endocarditis. Cardiology, ID and CT surgery consulted.   Nephrology following to resume HD.   Mental status had improved significantly over the last couple of days and patient was tolerating diet as per SLP.  However, last night, she was found to be lethargic during hemodialysis, probably after being given Benadryl.    Repeat CT head did not show any acute new intracranial abnormality.      Cardiac valve mass, status post 6 weeks of antibiotics.  Discussion with treatment team regarding possible treatment options, angio VAC debridement.     End-stage renal disease on hemodialysis -Nephrology following.  Dialysis as per nephrology schedule   History of PVD status post right BKA -Currently wheelchair-bound   Anemia of chronic  disease -From renal failure.  Hemoglobin stable.  Monitor intermittently.    Diabetes mellitus type 2 with hyperglycemia -A1c 7.9.  Continue CBGs with SSI      This NP assessed patient at the bedside as a follow up for palliative medicine needs and emotional support.  Husband at bedside.  Today is day 11 of this hospitalization and patient continues to show signs of improvement.  Plan is for probable discharge to SNF for short-term rehab in the morning   Continued education regarding complex, serious medical situation and her high risk for decompensation secondary to her multiple comorbidities.   She and her husband both understand the seriousness, both remain hopeful for improvement.   [name] tells me that she is" putting her trusting God"  Education offered today regarding  the importance of continued conversation with each other, family  and their  medical providers regarding overall plan of care and treatment options,  ensuring decisions are within the context of the patients values and GOCs.  Patient declined assistance with securing H POA, or advanced care planning documents at this time  Total time spent on the unit was 50 minutes-greater than 50% of the time was spent in counseling coordination of care   Questions and concerns addressed     Discussed with Dr. Mady Gemma NP  Palliative Medicine Team Team Phone # [phone] Pager [phone]

## 2022-12-23 NOTE — Progress Notes (Signed)
PROGRESS NOTE    Heather [name]  KPT:[id number] DOB: [birth year] DOA: 12/12/2022 PCP: [name], MD   Brief Narrative:  51 yo female with past medical history of unspecified CVAs, ESRD on HD, DMII, gastroparesis, PE/DVT, HTN, RBKA, hospitalized in October 2 103 for acute CVA and multiple reasons with 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  MRI brain on admission was positive for new possible embolic right occipital infarcts when compared to October.  Neurology was consulted.  She has had issues with encephalopathy, aphasia and dysphagia which is slowly improving.  Nephrology consulted to continue dialysis.  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  Cardiology was consulted for TEE and ID was consulted.  She underwent TEE on 12/16/2022  Assessment & Plan:   Acute ischemic right occipital infarcts, possibly embolic Acute metabolic encephalopathy -Presented with encephalopathy, aphasia and was nonverbal -MRI brain showed new right occipital ischemic infarcts possibly from the valvular mass/vegetation.   Neurology recommended aspirin and Plavix for 3 weeks then aspirin alone. -A1c 7.4.  LDL 135.  Continue Lipitor. -EEG negative for seizures.  LTM EEG negative for seizures.   Mental status has improved.  Continue to monitor. SNF recommended by PT OT.   Stable from a neurological standpoint.  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   -Patient reevaluated by Dr. Kipp Brood -family now requesting to be evaluated for left-sided angio vac which would need to be set up at tertiary  care center, likely [facility] per his documentation.  Will see if this can be set up as an outpatient follow-up given patient's stable status currently on antibiotics -Currently on cefepime and vancomycin as per ID -appreciate insight recommendations Remains afebrile.  End-stage renal disease on hemodialysis -Nephrology following.  Dialysis as per nephrology Patient concerned about low blood pressures with dialysis.  Apparently drop in blood pressure impairs her hearing.  Patient not noted to be on any antihypertensives.  She was reassured.  We will continue to monitor.  History of PVD status post right BKA Currently wheelchair-bound  Anemia of chronic disease Hemoglobin stable.  Monitor intermittently.  Hyponatremia Stable  Thrombocytopenia Questionable cause.  Resolved  Diabetes mellitus type 2 with hyperglycemia A1c 7.9.  Continue CBGs with SSI  Goals of care Palliative care consultation appreciated.  Currently remains full code.  DVT prophylaxis: Heparin subcutaneous Code Status: Full Family Communication: Husband at bedside Disposition Plan: SNF when medically stable  Status is: Inpatient Remains inpatient appropriate because: Acute stroke, infective endocarditis  Consultants: Nephrology/neurology/cardiothoracic surgery/ID.  Palliative care  Procedures: As above  Antimicrobials:  Anti-infectives (From admission, onward)    Start     Dose/Rate Route Frequency Ordered Stop   12/24/22 1200  vancomycin (VANCOCIN) IVPB 1000 mg/200 mL premix        1,000 mg 200 mL/hr over 60 Minutes Intravenous Every M-W-F (Hemodialysis) 12/22/22 1608     12/22/22 1800  ceFEPIme (MAXIPIME) 2 g in sodium chloride 0.9 % 100 mL IVPB        2 g 200 mL/hr over 30 Minutes Intravenous Every M-W-F (1800) 12/22/22 1613     12/21/22 0721  vancomycin variable dose per unstable renal function (  pharmacist dosing)  Status:  Discontinued         Does not apply See admin instructions 12/21/22 0721 12/22/22  1609   12/19/22 1200  vancomycin (VANCOCIN) IVPB 1000 mg/200 mL premix        1,000 mg 200 mL/hr over 60 Minutes Intravenous Once in dialysis 12/19/22 0828 12/19/22 1631   12/17/22 0900  vancomycin (VANCOREADY) IVPB 500 mg/100 mL  Status:  Discontinued        500 mg 100 mL/hr over 60 Minutes Intravenous  Once 12/17/22 0812 12/17/22 0816   12/16/22 1200  vancomycin (VANCOCIN) IVPB 1000 mg/200 mL premix  Status:  Discontinued        1,000 mg 200 mL/hr over 60 Minutes Intravenous  Once 12/15/22 1228 12/17/22 0812   12/15/22 1200  vancomycin (VANCOCIN) IVPB 1000 mg/200 mL premix  Status:  Discontinued        1,000 mg 200 mL/hr over 60 Minutes Intravenous Every M-W-F (Hemodialysis) 12/13/22 1823 12/15/22 1228   12/14/22 2200  ceFEPIme (MAXIPIME) 1 g in sodium chloride 0.9 % 100 mL IVPB  Status:  Discontinued        1 g 200 mL/hr over 30 Minutes Intravenous Every 24 hours 12/14/22 1322 12/22/22 1613   12/14/22 1500  vancomycin (VANCOCIN) IVPB 1000 mg/200 mL premix        1,000 mg 200 mL/hr over 60 Minutes Intravenous  Once 12/14/22 1329 12/14/22 1532   12/13/22 2200  piperacillin-tazobactam (ZOSYN) IVPB 2.25 g  Status:  Discontinued        2.25 g 100 mL/hr over 30 Minutes Intravenous Every 8 hours 12/13/22 1820 12/14/22 1322   12/13/22 1830  vancomycin (VANCOCIN) 2,250 mg in sodium chloride 0.9 % 500 mL IVPB        2,250 mg 261.3 mL/hr over 120 Minutes Intravenous  Once 12/13/22 1820 12/14/22 0115        Subjective: Denies any complaints.  Hearing has improved.   Objective: Vitals:   12/22/22 1935 12/23/22 0031 12/23/22 0303 12/23/22 0738  BP: (!) 172/71 135/68 122/64 (!) 181/81  Pulse: 88 84 82 85  Resp: '18 15 10 13  '$ Temp: 97.7 F (36.5 C) 98.6 F (37 C) 98.5 F (36.9 C) 98.6 F (37 C)  TempSrc: Oral Oral Oral Oral  SpO2: 98% 100% 94% 100%  Weight:      Height:        Intake/Output Summary (Last 24 hours) at 12/23/2022 0803 Last data filed at 12/22/2022 1225 Gross per  24 hour  Intake --  Output 1 ml  Net -1 ml    Filed Weights   12/19/22 1651 12/22/22 0851 12/22/22 1239  Weight: 99.9 kg 102.5 kg 102.5 kg    Examination:  General appearance: Awake alert.  In no distress Resp: Clear to auscultation bilaterally.  Normal effort Cardio: S1-S2 is normal regular.  No S3-S4.  No rubs murmurs or bruit GI: Abdomen is soft.  Nontender nondistended.  Bowel sounds are present normal.  No masses organomegaly  Data Reviewed: I have personally reviewed following labs and imaging studies  CBC: Recent Labs  Lab 12/17/22 0642 12/18/22 0258 12/19/22 0257 12/22/22 0119  WBC 8.0 6.1 6.9 6.8  NEUTROABS 5.4 3.4 3.5  --   HGB 10.2* 9.7* 9.4* 9.2*  HCT 31.2* 30.0* 30.3* 30.0*  MCV 93.4 94.0 95.9 97.4  PLT 158 147* 167 237    Basic Metabolic Panel: Recent Labs  Lab 12/17/22 0642 12/18/22 0258 12/19/22 0257 12/20/22 0327 12/21/22  0630 12/22/22 0119 12/23/22 0202  NA 135 136 135 134* 133* 135 132*  K 3.9 3.9 4.1 4.5 4.1 4.0 3.6  CL 92* 96* 97* 97* 100 99 98  CO2 '26 23 26 '$ 21* 20* 24 21*  GLUCOSE 107* 147* 139* 166* 126* 163* 139*  BUN 30* 41* 52* 30* 47* 56* 34*  CREATININE 8.09* 10.10* 12.17* 7.35* 9.97* 11.50* 7.22*  CALCIUM 9.6 9.4 9.0 8.9 9.3 9.6 9.0  MG 2.1 2.0 2.2  --   --   --   --   PHOS 5.0* 7.2* 7.6* 4.8* 5.4* 5.3* 3.5    GFR: Estimated Creatinine Clearance: 11.5 mL/min (A) (by C-G formula based on SCr of 7.22 mg/dL (H)).  Liver Function Tests: Recent Labs  Lab 12/19/22 0257 12/20/22 0327 12/21/22 0455 12/22/22 0119 12/23/22 0202  ALBUMIN 2.9* 2.8* 2.8* 2.9* 2.8*      CBG: Recent Labs  Lab 12/22/22 0809 12/22/22 1308 12/22/22 1718 12/22/22 2120 12/23/22 0647  GLUCAP 181* 115* 169* 210* 130*      Recent Results (from the past 240 hour(s))  Culture, blood (Routine X 2) w Reflex to ID Panel     Status: None   Collection Time: 12/13/22  8:55 PM   Specimen: BLOOD RIGHT ARM  Result Value Ref Range Status   Specimen  Description BLOOD RIGHT ARM  Final   Special Requests   Final    BOTTLES DRAWN AEROBIC ONLY Blood Culture results may not be optimal due to an inadequate volume of blood received in culture bottles   Culture   Final    NO GROWTH 5 DAYS Performed at [facility], [place] [address], [place], [place] [address]    Report Status 12/18/2022 FINAL  Final  Culture, blood (Routine X 2) w Reflex to ID Panel     Status: None   Collection Time: 12/13/22  9:15 PM   Specimen: BLOOD LEFT ARM  Result Value Ref Range Status   Specimen Description BLOOD LEFT ARM  Final   Special Requests   Final    BOTTLES DRAWN AEROBIC AND ANAEROBIC Blood Culture results may not be optimal due to an inadequate volume of blood received in culture bottles   Culture   Final    NO GROWTH 5 DAYS Performed at [facility], [place] [address], [place], [place] [address]    Report Status 12/18/2022 FINAL  Final  Culture, blood (Routine X 2) w Reflex to ID Panel     Status: None   Collection Time: 12/14/22  3:55 AM   Specimen: BLOOD LEFT HAND  Result Value Ref Range Status   Specimen Description BLOOD LEFT HAND  Final   Special Requests   Final    BOTTLES DRAWN AEROBIC ONLY Blood Culture results may not be optimal due to an inadequate volume of blood received in culture bottles   Culture   Final    NO GROWTH 5 DAYS Performed at [facility], [place] [address] [place], [place] [address]    Report Status 12/19/2022 FINAL  Final  Culture, blood (Routine X 2) w Reflex to ID Panel     Status: None   Collection Time: 12/14/22  4:08 AM   Specimen: BLOOD RIGHT HAND  Result Value Ref Range Status   Specimen Description BLOOD RIGHT HAND  Final   Special Requests   Final    BOTTLES DRAWN AEROBIC ONLY Blood Culture results may not be optimal due to an inadequate volume  of blood received in culture bottles   Culture   Final    NO GROWTH 5 DAYS Performed at [facility], [place] [address] [place], [place] [address]     Report Status 12/19/2022 FINAL  Final     Radiology Studies: No results found.   Scheduled Meds:  (feeding supplement) PROSource Plus  30 mL Oral BID BM   aspirin  81 mg Oral Daily   atorvastatin  80 mg Oral QHS   calcium acetate  1,334 mg Oral TID WC   Chlorhexidine Gluconate Cloth  6 each Topical Q0600   Chlorhexidine Gluconate Cloth  6 each Topical Q0600   clopidogrel  75 mg Oral Daily   darbepoetin (ARANESP) injection - DIALYSIS  25 mcg Subcutaneous Q Wed-1800   doxercalciferol  4 mcg Intravenous Q M,W,F-HD   heparin  5,000 Units Subcutaneous Q8H   insulin aspart  0-15 Units Subcutaneous TID PC & HS   sodium chloride flush  10-40 mL Intracatheter Q12H   Continuous Infusions:  anticoagulant sodium citrate     ceFEPime (MAXIPIME) IV 2 g (12/22/22 1821)   [START ON 12/24/2022] vancomycin     Time spent: 25 minutes   Little Ishikawa, DO [facility] 12/23/2022, 8:03 AM

## 2022-12-23 NOTE — Progress Notes (Signed)
Occupational Therapy Treatment Patient Details Name: Heather [name] MRN: [id number] DOB: Dec 19, 1971 Today's Date: 12/23/2022   History of present illness 51 year old admitted with new CVAs.  CT scan of the head with left paramedian frontal parietal region infarct.  MRI showed new 9 mm posterior left cerebral infarct.  Pt with PMH of cardiac arrest, stroke, ESRD on hemodialysis Monday Wednesday Friday, insulin-dependent type 2 diabetes with gastroparesis, PE and DVT on Eliquis, hypertension, right below-knee amputation who was brought from home with altered mental status and aphasia.  Hospitalization 10/21/2022-10/24/2022 with acute infarct left paramedian frontoparietal region.   OT comments  Pt progressing towards established OT goals. Pt pleasant on arrival and willing to participate in therapy session with min encouragement after education provided [id number] of mobility during hospitalization. Pt reporting decreased activity tolerance and OT providing education regarding optimizing activity tolerance. Pt did not want to transfer to chair, but willing to perform BUE exercises and lateral scoots along EOB. Pt performing lateral scoots from [facility] to foot of bed and back this session with increased time and min guard A for safety. Pt performing chair push ups, BUE shoulder flexion, and seated rows this session. Pt requiring intermittent rest breaks. Continue to recommend SNF to optimize safety and independence in ADL and IADL.    Recommendations for follow up therapy are one component of a multi-disciplinary discharge planning process, led by the attending physician.  Recommendations may be updated based on patient status, additional functional criteria and insurance authorization.    Follow Up Recommendations  Skilled nursing-short term rehab (<3 hours/day)     Assistance Recommended at Discharge Frequent or constant Supervision/Assistance  Patient can return home with the following  A  lot of help with walking and/or transfers;A lot of help with bathing/dressing/bathroom;Help with stairs or ramp for entrance;Direct supervision/assist for medications management;Assistance with feeding;Assist for transportation;Direct supervision/assist for financial management   Equipment Recommendations  Other (comment) (TBD next venue of care)    Recommendations for Other Services      Precautions / Restrictions Precautions Precautions: Fall Precaution Comments: R BKA Restrictions Weight Bearing Restrictions: No       Mobility Bed Mobility Overal bed mobility: Needs Assistance Bed Mobility: Supine to Sit, Sit to Supine     Supine to sit: Min guard Sit to supine: Min guard   General bed mobility comments: min guard for safety    Transfers Overall transfer level: Needs assistance Equipment used: None              Lateral/Scoot Transfers: Min guard General transfer comment: Min guard A for safety with scoots along EOB     Balance Overall balance assessment: Needs assistance Sitting-balance support: Single extremity supported Sitting balance-Leahy Scale: Good Sitting balance - Comments: Pt able to maintain balance while donning gripper sock                                   ADL either performed or assessed with clinical judgement   ADL Overall ADL's : Needs assistance/impaired                     Lower Body Dressing: Set up;Supervision/safety;Sitting/lateral leans Lower Body Dressing Details (indicate cue type and reason): to don L sock Toilet Transfer: Min guard (lateral scoots along EOB to simulate. Min guard. Pt with limited clearance of bottom, but good anterior weight shift. Min guard A for safety)  Functional mobility during ADLs: Min guard (lateral scoots sititng EOB from [facility] to foot of bed and back) General ADL Comments: Pt with greater affeect this session, following commands, and able to motor plan for LB dressing,  bed mobility, and scoots along EOB    Extremity/Trunk Assessment Upper Extremity Assessment Upper Extremity Assessment: Generalized weakness   Lower Extremity Assessment Lower Extremity Assessment: Defer to PT evaluation        Vision   Additional Comments: making good eye contact with therapist on R and husband on L this session   Perception     Praxis      Cognition Arousal/Alertness: Awake/alert Behavior During Therapy: WFL for tasks assessed/performed Overall Cognitive Status: Impaired/Different from baseline Area of Impairment: Following commands, Safety/judgement, Problem solving                       Following Commands: Follows one step commands consistently, Follows multi-step commands inconsistently Safety/Judgement: Decreased awareness of deficits   Problem Solving: Slow processing, Requires verbal cues General Comments: Pt following all one step commands with increased time. Benefiting from visual demonstration for therapeutic exercises intermittently. Pt with decreased knowledge of need for activity while in hospital, but agreeable after education. Pt also reporting that R residulal limb is her "power leg" and with decreased awareness of safety with transfers/lateral scoots EOB        Exercises Exercises: Other exercises Other Exercises Other Exercises: Chair push ups x10 with visual demo for optimal performance. Scooting along EOB from [facility] to foot of bed and back with min guard A for safety. Other Exercises: Marching in place EOB with BLE Other Exercises: AROM shoulder flexion BUE 10x Other Exercises: Red theraband with scapular retraction/row 10x Other Exercises: Squeeze ball 10x ea hand    Shoulder Instructions       General Comments VSS    Pertinent Vitals/ Pain       Pain Assessment Pain Assessment: No/denies pain Pain Intervention(s): Monitored during session  Home Living                                           Prior Functioning/Environment              Frequency  Min 2X/week        Progress Toward Goals  OT Goals(current goals can now be found in the care plan section)  Progress towards OT goals: Progressing toward goals  Acute Rehab OT Goals Patient Stated Goal: have more energy OT Goal Formulation: With patient Time For Goal Achievement: 12/29/22 Potential to Achieve Goals: Good ADL Goals Pt Will Perform Eating: with set-up;sitting Pt Will Perform Grooming: with supervision;sitting Pt Will Perform Lower Body Dressing: with modified independence;sitting/lateral leans Pt Will Transfer to Toilet: with min assist;squat pivot transfer Pt Will Perform Toileting - Clothing Manipulation and hygiene: with min assist;sitting/lateral leans  Plan Discharge plan remains appropriate    Co-evaluation                 AM-PAC OT "6 Clicks" Daily Activity     Outcome Measure   Help from another person eating meals?: A Little Help from another person taking care of personal grooming?: A Little Help from another person toileting, which includes using toliet, bedpan, or urinal?: A Lot Help from another person bathing (including washing, rinsing, drying)?: A Little Help from another person to  put on and taking off regular upper body clothing?: A Little Help from another person to put on and taking off regular lower body clothing?: A Little 6 Click Score: 17    End of Session    OT Visit Diagnosis: Unsteadiness on feet (R26.81);Muscle weakness (generalized) (M62.81);Other abnormalities of gait and mobility (R26.89);Cognitive communication deficit (R41.841);Other symptoms and signs involving cognitive function Symptoms and signs involving cognitive functions: Cerebral infarction   Activity Tolerance Patient tolerated treatment well   Patient Left in bed;with call bell/phone within reach;with bed alarm set   Nurse Communication Mobility status        Time: 1545-1620 OT Time  Calculation (min): 35 min  Charges: OT General Charges $OT Visit: 1 Visit OT Treatments $Therapeutic Activity: 8-22 mins $Therapeutic Exercise: 8-22 mins  Magnus Ivan, OTD, OTR/L [facility] Acute Rehabilitation Office: [phone]    Magnus Ivan 12/23/2022, 4:50 PM

## 2022-12-23 NOTE — TOC Progression Note (Signed)
Transition of Care [facility]) - Progression Note    Patient Details  Name: Heather [name] MRN: [id number] Date of Birth: 1971/05/03  Transition of Care [facility]) CM/SW Contact  Jinger Neighbors, [place] Phone Number: 12/23/2022, 11:06 AM  Clinical Narrative:    CSW attempted to follow up with Perrin Smack at [facility] to discuss the dialysis schedule CSW sent on 12/27. CSW left a voicemail asking for a returned call.      Barriers to Discharge: SNF Pending bed offer  Expected Discharge Plan and Services                                               Social Determinants of Health (SDOH) Interventions SDOH Screenings   Food Insecurity: No Food Insecurity (12/15/2022)  Housing: Low Risk  (12/15/2022)  Transportation Needs: No Transportation Needs (12/15/2022)  Utilities: Not At Risk (12/15/2022)  Depression (PHQ2-9): Low Risk  (10/26/2022)  Financial Resource Strain: Low Risk  (10/26/2022)  Physical Activity: Inactive (10/26/2022)  Social Connections: Moderately Integrated (10/26/2022)  Stress: No Stress Concern Present (10/26/2022)  Tobacco Use: Low Risk  (12/20/2022)    Readmission Risk Interventions     No data to display

## 2022-12-23 NOTE — Progress Notes (Signed)
[facility] Progress Note   Subjective: Seen in room with husband at bedside. Met with Dr. Kipp Brood this AM. Azell Der toward L sided angiovac debridement which would have to be done at [facility]. HD tomorrow on schedule.   Objective Vitals:   12/23/22 0303 12/23/22 0738 12/23/22 0755 12/23/22 1122  BP: 122/64 (!) 181/81 (!) 172/80 132/62  Pulse: 82 85  91  Resp: _0 Temp: 98.5 F (36.9 C) 98.6 F (37 C)  98.1 F (36.7 C)  TempSrc: Oral Oral  Oral  SpO2: 94% 100%  100%  Weight:      Height:       Physical Exam General: Pleasant WN female in NAD Heart: S1,S2 RRR SR on monitor No M/R/G Lungs: CTAB A/P Abdomen: NABS, NT Extremities: R BKA no stump edema, no LLE edema Dialysis Access: Permanent TDC Drsg CDI (Cardiac arrest when last attempted permanent access)   Additional Objective Labs: Basic Metabolic Panel: Recent Labs  Lab 12/21/22 0455 12/22/22 0119 12/23/22 0202  NA 133* 135 132*  K 4.1 4.0 3.6  CL 100 99 98  CO2 20* 24 21*  GLUCOSE 126* 163* 139*  BUN 47* 56* 34*  CREATININE 9.97* 11.50* 7.22*  CALCIUM 9.3 9.6 9.0  PHOS 5.4* 5.3* 3.5   Liver Function Tests: Recent Labs  Lab 12/21/22 0455 12/22/22 0119 12/23/22 0202  ALBUMIN 2.8* 2.9* 2.8*   No results for input(s): "LIPASE", "AMYLASE" in the last 168 hours. CBC: Recent Labs  Lab 12/17/22 0642 12/18/22 0258 12/19/22 0257 12/22/22 0119  WBC 8.0 6.1 6.9 6.8  NEUTROABS 5.4 3.4 3.5  --   HGB 10.2* 9.7* 9.4* 9.2*  HCT 31.2* 30.0* 30.3* 30.0*  MCV 93.4 94.0 95.9 97.4  PLT 158 147* 167 169   Blood Culture    Component Value Date/Time   SDES BLOOD RIGHT HAND 12/14/2022 0408   SPECREQUEST  12/14/2022 0408    BOTTLES DRAWN AEROBIC ONLY Blood Culture results may not be optimal due to an inadequate volume of blood received in culture bottles   CULT  12/14/2022 0408    NO GROWTH 5 DAYS Performed at [facility] [address], [place], [place] [address]    REPTSTATUS  12/19/2022 FINAL 12/14/2022 0408    Cardiac Enzymes: No results for input(s): "CKTOTAL", "CKMB", "CKMBINDEX", "TROPONINI" in the last 168 hours. CBG: Recent Labs  Lab 12/22/22 1308 12/22/22 1718 12/22/22 2120 12/23/22 0647 12/23/22 1217  GLUCAP 115* 169* 210* 130* 189*   Iron Studies: No results for input(s): "IRON", "TIBC", "TRANSFERRIN", "FERRITIN" in the last 72 hours. _1 @ Studies/Results: No results found. Medications:  anticoagulant sodium citrate     ceFEPime (MAXIPIME) IV 2 g (12/22/22 1821)   [START ON 12/24/2022] vancomycin      (feeding supplement) PROSource Plus  30 mL Oral BID BM   aspirin  81 mg Oral Daily   atorvastatin  80 mg Oral QHS   calcium acetate  1,334 mg Oral TID WC   Chlorhexidine Gluconate Cloth  6 each Topical Q0600   Chlorhexidine Gluconate Cloth  6 each Topical Q0600   clopidogrel  75 mg Oral Daily   darbepoetin (ARANESP) injection - DIALYSIS  25 mcg Subcutaneous Q Wed-1800   doxercalciferol  4 mcg Intravenous Q M,W,F-HD   heparin  5,000 Units Subcutaneous Q8H   insulin aspart  0-15 Units Subcutaneous TID PC & HS   sodium chloride flush  10-40 mL Intracatheter Q12H    OP HD: HD  MWF  3h 55mn  400/700  102.3kg  2/2 bath  TDC   - Heparin 7000 units IV TIW - venofer 551m IV weekly - hectorol 29m57mIV q HD   CXR 12/17 - no active disease   Assessment/Plan:  New embolic stroke: hx of recent endocarditis. HD center did have to use ceftaz at one point due to cefepime shortage but appears she otherwise completed her IV abx. Repeat echo here showed mass on posterior mitral valve. 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  ID following, cont IV abx for now while looking into other possible atypical infectious causes and non-infectious causes.  Acute encephalopathy: new embolic stroke on MRI but also took baclofen which is contraindicated in ESRD.  Stopped baclofen. Thankfully she is now awake, alert, oriented X 3.   ESRD -  HD MWF. Next HD 12/24/2022  BP/volume - BP labile at times. Issues with hypotension on admit. Amlodipine on hold. Volume is fine. Currently at OP EDW. UF as tolerated.   Anemia:9.2 12/22/2022, Started low dose ESA. Follow HGB.    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 Continue phoslo 2- 3 ac as binder and cont IV vdra.   Nutrition:  Renal Carb mod diet. Albumin low. Start protein supplements.  T2DM: management per primary team  RitJimmye Normanrown NP-C 12/23/2022, 1:53 PM  [phone]

## 2022-12-23 NOTE — Progress Notes (Signed)
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  She completed this antibiotic course in mid November.  Ceftazidime was used briefly due to cefepime shortage at HD, but doubt this is of any significance.  She developed CNS embolic infarcts while on antibiotic therapy as noted on MRI in October 2023.  Follow-up MRI this admission in the setting of encephalopathy showed new infarcts from prior.  TEE was done 12/16/2022 which showed similar appearance of mitral valve mass from her prior TEE in October.  Unclear if this truly represents infective endocarditis as would expect a vegetation to have some resorption following prolonged antibiotic therapy.  Additionally, all blood cultures have been negative and she has not had any infectious symptoms to really speak of.  Further workup for atypical ID etiologies has been unrevealing as well as workup for nonbacterial thrombotic endocarditis.   #ESRD on HD: She receives dialysis via a tunneled HD line.  Kidney failure secondary to poorly controlled diabetes and hypertension as best she is aware and from prior notes.   RECOMMENDATIONS:    Will continue with cefepime and vancomycin as is for now Discussed with [facility] about disposition (such as hospital to hospital transfer vs outpatient follow up) based on updated CVTS recommendations today for possible angiovac debridement If she has debridement, then valve tissue could be sent for pathology and cultures as well as broad range PCR Will follow up pending final dispo   Principal Problem:   Acute embolic stroke ([place]) Active Problems:   Diabetes mellitus, type II, insulin  dependent ([place]), on CGM, with end organ damage: renal, neuropathy, gastroparesis, retinopathy   Hypertension associated with diabetes ([place])   Anemia in chronic kidney disease, on chronic dialysis ([place])   ESRD on hemodialysis ([place])   S/P BKA (below knee amputation), right ([place])   ESRD (end stage renal disease) on dialysis ([place])   PVD (peripheral vascular disease) ([place])   Acute metabolic encephalopathy   Encephalopathy   Cardiac valve mass   DNR (do not resuscitate) discussion   Palliative care by specialist    MEDICATIONS:    Scheduled Meds:  (feeding supplement) PROSource Plus  30 mL Oral BID BM   aspirin  81 mg Oral Daily   atorvastatin  80 mg Oral QHS   calcium acetate  1,334 mg Oral TID WC   Chlorhexidine Gluconate Cloth  6 each Topical Q0600   Chlorhexidine Gluconate Cloth  6 each Topical Q0600   clopidogrel  75 mg Oral Daily   darbepoetin (ARANESP) injection - DIALYSIS  25 mcg Subcutaneous Q Wed-1800   doxercalciferol  4 mcg Intravenous Q M,W,F-HD   heparin  5,000 Units Subcutaneous Q8H   insulin aspart  0-15 Units Subcutaneous TID PC & HS   sodium chloride flush  10-40 mL Intracatheter Q12H   Continuous Infusions:  anticoagulant sodium citrate     ceFEPime (MAXIPIME) IV 2 g (12/22/22 1821)   [START ON 12/24/2022] vancomycin     PRN Meds:.acetaminophen **OR** acetaminophen (TYLENOL) oral liquid 160 mg/5 mL **OR** acetaminophen, alteplase, anticoagulant sodium citrate, heparin, hydrocortisone, lip balm, sodium chloride flush  SUBJECTIVE:   24 hour events:  Follow up note as not seen by ID since 12/22. Cx all negative Atypical ID causes not revealing Autoimmune serologies also not revealing Patient met with Dr Kipp Brood today and is leaning towards Angiovac debridement which would need to be done at [facility] [place] [phone] pager 12/23/2022, 2:58 PM

## 2022-12-23 NOTE — Progress Notes (Signed)
     [address]       [place] [address]             [phone]       I had a long discussion with the patient and her husband.  We discussed 3 potential options:   continued abx therapy Left sided angiovac debridement, which will need to occur at [facility]. Surgical valve repair/replacement which will require new renal access.  The family is leaning towards angiovac debridement.  Since the vegetation is on the left side, he will need to be transferred to a center that performs that procedure.  Heather [name] Heather [name]

## 2022-12-24 DIAGNOSIS — I639 Cerebral infarction, unspecified: Secondary | ICD-10-CM | POA: Diagnosis not present

## 2022-12-24 LAB — GLUCOSE, CAPILLARY
Glucose-Capillary: 130 mg/dL — ABNORMAL HIGH (ref 70–99)
Glucose-Capillary: 83 mg/dL (ref 70–99)

## 2022-12-24 LAB — RENAL FUNCTION PANEL
Albumin: 2.8 g/dL — ABNORMAL LOW (ref 3.5–5.0)
Anion gap: 17 — ABNORMAL HIGH (ref 5–15)
BUN: 46 mg/dL — ABNORMAL HIGH (ref 6–20)
CO2: 21 mmol/L — ABNORMAL LOW (ref 22–32)
Calcium: 9.4 mg/dL (ref 8.9–10.3)
Chloride: 97 mmol/L — ABNORMAL LOW (ref 98–111)
Creatinine, Ser: 9.04 mg/dL — ABNORMAL HIGH (ref 0.44–1.00)
GFR, Estimated: 5 mL/min — ABNORMAL LOW (ref >60)
Glucose, Bld: 136 mg/dL — ABNORMAL HIGH (ref 70–99)
Phosphorus: 4.2 mg/dL (ref 2.5–4.6)
Potassium: 4 mmol/L (ref 3.5–5.1)
Sodium: 135 mmol/L (ref 135–145)

## 2022-12-24 LAB — CBC
HCT: 26.2 % — ABNORMAL LOW (ref 36.0–46.0)
Hemoglobin: 8.3 g/dL — ABNORMAL LOW (ref 12.0–15.0)
MCH: 30.2 pg (ref 26.0–34.0)
MCHC: 31.7 g/dL (ref 30.0–36.0)
MCV: 95.3 fL (ref 80.0–100.0)
Platelets: 164 10*3/uL (ref 150–400)
RBC: 2.75 MIL/uL — ABNORMAL LOW (ref 3.87–5.11)
RDW: 14.8 % (ref 11.5–15.5)
WBC: 6.3 10*3/uL (ref 4.0–10.5)
nRBC: 0 % (ref 0.0–0.2)

## 2022-12-24 MED ORDER — HEPARIN SODIUM (PORCINE) 1000 UNIT/ML DIALYSIS
7000.0000 [IU] | Freq: Once | INTRAMUSCULAR | Status: AC
  Administered 2022-12-24: 7000 [IU] via INTRAVENOUS_CENTRAL
  Filled 2022-12-24: qty 7

## 2022-12-24 MED ORDER — ATORVASTATIN CALCIUM 80 MG PO TABS: 80.0000 mg | ORAL_TABLET | Freq: Every day | ORAL | 0 refills | Status: DC

## 2022-12-24 MED ORDER — LIDOCAINE-PRILOCAINE 2.5-2.5 % EX CREA: 1.0000 | TOPICAL_CREAM | CUTANEOUS | Status: DC | PRN

## 2022-12-24 MED ORDER — LIDOCAINE HCL (PF) 1 % IJ SOLN: 5.0000 mL | INTRAMUSCULAR | Status: DC | PRN

## 2022-12-24 MED ORDER — DOXERCALCIFEROL 4 MCG/2ML IV SOLN: 2.0000 ug | INTRAVENOUS | Status: DC

## 2022-12-24 MED ORDER — HEPARIN SODIUM (PORCINE) 1000 UNIT/ML IJ SOLN
INTRAMUSCULAR | Status: AC
  Filled 2022-12-24: qty 4

## 2022-12-24 MED ORDER — HEPARIN SODIUM (PORCINE) 1000 UNIT/ML IJ SOLN
1600.0000 [IU] | Freq: Once | INTRAMUSCULAR | Status: AC
  Administered 2022-12-24: 1600 [IU]
  Filled 2022-12-24: qty 1.6

## 2022-12-24 MED ORDER — CEFEPIME IV (FOR PTA / DISCHARGE USE ONLY): 2.0000 g | INTRAVENOUS | 0 refills | Status: AC

## 2022-12-24 MED ORDER — PENTAFLUOROPROP-TETRAFLUOROETH EX AERO: 1.0000 | INHALATION_SPRAY | CUTANEOUS | Status: DC | PRN

## 2022-12-24 MED ORDER — VANCOMYCIN IV (FOR PTA / DISCHARGE USE ONLY): 1000.0000 mg | INTRAVENOUS | 0 refills | Status: AC

## 2022-12-24 MED ORDER — CLOPIDOGREL BISULFATE 75 MG PO TABS: 75.0000 mg | ORAL_TABLET | Freq: Every day | ORAL | 0 refills | Status: AC

## 2022-12-24 NOTE — Progress Notes (Addendum)
Heather [name] Progress Note   Subjective:   Seen on HD. Feeling well overall. Denies SOB, CP, palpitations, dizziness and nausea.   Objective Vitals:   12/24/22 0719 12/24/22 0809 12/24/22 0828 12/24/22 0900  BP: (!) 167/79 134/69 135/70   Pulse: 87 87 84 85  Resp: 19 18 (!) 23 12  Temp: 98 F (36.7 C) 98.2 F (36.8 C)    TempSrc: Oral Oral    SpO2:  98% 97% 100%  Weight:      Height:       Physical Exam General: Alert female in NAD Heart: RRR, no murmurs, rubs or gallops Lungs: CTA bilaterally, no wheezing, rhonchi or rales Abdomen: Soft, non-distended, +BS Extremities: R BKA, no edema b/l lower extremities Dialysis Access: [facility] accessed  Additional Objective Labs: Basic Metabolic Panel: Recent Labs  Lab 12/22/22 0119 12/23/22 0202 12/24/22 0326  NA 135 132* 135  K 4.0 3.6 4.0  CL 99 98 97*  CO2 24 21* 21*  GLUCOSE 163* 139* 136*  BUN 56* 34* 46*  CREATININE 11.50* 7.22* 9.04*  CALCIUM 9.6 9.0 9.4  PHOS 5.3* 3.5 4.2   Liver Function Tests: Recent Labs  Lab 12/22/22 0119 12/23/22 0202 12/24/22 0326  ALBUMIN 2.9* 2.8* 2.8*   No results for input(s): "LIPASE", "AMYLASE" in the last 168 hours. CBC: Recent Labs  Lab 12/18/22 0258 12/19/22 0257 12/22/22 0119 12/24/22 0823  WBC 6.1 6.9 6.8 6.3  NEUTROABS 3.4 3.5  --   --   HGB 9.7* 9.4* 9.2* 8.3*  HCT 30.0* 30.3* 30.0* 26.2*  MCV 94.0 95.9 97.4 95.3  PLT 147* 167 169 164   Blood Culture    Component Value Date/Time   SDES BLOOD RIGHT HAND 12/14/2022 0408   SPECREQUEST  12/14/2022 0408    BOTTLES DRAWN AEROBIC ONLY Blood Culture results may not be optimal due to an inadequate volume of blood received in culture bottles   CULT  12/14/2022 0408    NO GROWTH 5 DAYS Performed at [facility] [address], [place], [place] [address]    REPTSTATUS 12/19/2022 FINAL 12/14/2022 0408    Cardiac Enzymes: No results for input(s): "CKTOTAL", "CKMB", "CKMBINDEX", "TROPONINI" in the  last 168 hours. CBG: Recent Labs  Lab 12/23/22 0647 12/23/22 1217 12/23/22 1701 12/23/22 2135 12/24/22 0614  GLUCAP 130* 189* 128* 118* 130*   Iron Studies: No results for input(s): "IRON", "TIBC", "TRANSFERRIN", "FERRITIN" in the last 72 hours. '@[id number]'$ @ Studies/Results: No results found. Medications:  anticoagulant sodium citrate     ceFEPime (MAXIPIME) IV Stopped (12/22/22 1858)   vancomycin      (feeding supplement) PROSource Plus  30 mL Oral BID BM   aspirin  81 mg Oral Daily   atorvastatin  80 mg Oral QHS   calcium acetate  1,334 mg Oral TID WC   Chlorhexidine Gluconate Cloth  6 each Topical Q0600   Chlorhexidine Gluconate Cloth  6 each Topical Q0600   clopidogrel  75 mg Oral Daily   darbepoetin (ARANESP) injection - DIALYSIS  25 mcg Subcutaneous Q Wed-1800   doxercalciferol  4 mcg Intravenous Q M,W,F-HD   heparin  5,000 Units Subcutaneous Q8H   insulin aspart  0-15 Units Subcutaneous TID PC & HS   sodium chloride flush  10-40 mL Intracatheter Q12H    Dialysis Orders: HD MWF  3h 67mn  400/700  102.3kg  2/2 bath  TDC   - Heparin 7000 units IV TIW - venofer '50mg'$   IV weekly -  hectorol 19mg IV q HD   CXR 12/17 - no active disease    Assessment/Plan:  New embolic stroke: hx of recent endocarditis. HD center did have to use ceftaz at one point due to cefepime shortage but appears she otherwise completed her IV abx. Repeat echo here showed mass on posterior mitral valve. 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  ID following, cont IV abx for now while looking into other possible atypical infectious causes and non-infectious causes.  Acute encephalopathy: new embolic stroke on MRI but also took baclofen which is contraindicated in ESRD. Stopped baclofen. Mental status is now back to baseline  ESRD -  HD MWF. Tolerating dialysis well.   BP/volume - BP labile at times.  Issues with hypotension on admit. Amlodipine on hold. Volume is fine. Currently at OP EDW. UF as tolerated.   Anemia: Hgb 8.3, Started ESA. Follow HGB.    MBD CKD: Phos at goal. Corrected calcium trending up. On IV VDRA and phoslo, will reduce hectorol dose for now.   Nutrition:  Renal Carb mod diet. Albumin low. Start protein supplements.  T2DM: management per primary team  SAnice Paganini PA-C 12/24/2022, 9:01 AM  [name] Pager: [phone]

## 2022-12-24 NOTE — Progress Notes (Signed)
Called report to [place] at [facility].

## 2022-12-24 NOTE — Progress Notes (Addendum)
PHARMACY CONSULT NOTE FOR:  OUTPATIENT  PARENTERAL ANTIBIOTIC THERAPY (OPAT)  Informational Only as patient will receive antibiotics at hemodialysis outpatient. Communicated regimen and end date to renal team.   Indication: Culture negative endocarditis Regimen: Vancomycin 1g and Cefepime 2g s/p HD sessions - typical schedule noted MWF End date: 01/25/23  IV antibiotic discharge orders are pended. To discharging provider:  please sign these orders via discharge navigator,  Select New Orders & click on the button choice - Manage This Unsigned Work.    Thank you for allowing pharmacy to be a part of this patient's care.  Alycia Rossetti, PharmD, BCPS Infectious Diseases Clinical Pharmacist 12/24/2022 9:34 AM   **Pharmacist phone directory can now be found on [email or web address] ([email or web address]).  Listed under [place].

## 2022-12-24 NOTE — Progress Notes (Signed)
Received patient in bed to unit.  Alert and oriented.  Informed consent signed and in chart.   Treatment initiated: 0828 Treatment completed: 1135  Patient tolerated well.  Transported back to the room  Alert, without acute distress.  Hand-off given to patient's nurse.   Access used: Catheter Access issues: none  Total UF removed: 2L Medication(s) given: none Post HD VS: 147/73,86,100,18,97.5 Post HD weight: 102.5kg   [facility]

## 2022-12-24 NOTE — Discharge Summary (Addendum)
Physician Discharge Summary  Heather [name] VEL:[id number] DOB: [birth year] DOA: 12/12/2022  PCP: [name], MD  Admit date: 12/12/2022 Discharge date: 12/24/2022  Admitted From: Home Disposition:  SNF  Recommendations for Outpatient Follow-up:  Follow up with PCP in 1-2 weeks Follow up with nephro as scheduled Follow up with Cardiothoracic surgery at [facility] as discussed with team here  Discharge Condition:Stable  CODE STATUS:Full  Diet recommendation: Renal diet    Brief/Interim Summary: 51 yo female with past medical history of unspecified CVAs, ESRD on HD, DMII, gastroparesis, PE/DVT, HTN, RBKA, hospitalized in October 2 103 for acute CVA and multiple reasons with 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 MRI brain on admission was positive for new possible embolic right occipital infarcts when compared to October. Neurology was consulted. She has had issues with encephalopathy, aphasia and dysphagia which is slowly improving. Nephrology consulted to continue dialysis. 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 Cardiology was consulted for TEE and ID was consulted. She underwent TEE on 12/16/2022 notable for posterior mitral valve mass.  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  Current plan is to continue antibiotics in the interim with both vancomycin and cefepime after dialysis Monday Wednesday Friday.  Patient has been referred to [facility] cardiothoracic surgery per our team here, Dr. Kipp Brood evaluated patient and recommended follow-up at their facility due to patient's comorbid conditions, risk and need for left-sided angio vac per previous evaluation.  At this  time patient is otherwise stable and agreeable for discharge, PT evaluating recommending SNF placement in the interim with hopeful disposition home thereafter.  Lengthy discussion with patient about need for compliance with both dialysis antibiotics and follow-up with [facility] cardiothoracic surgery for ongoing evaluation and treatment of her mitral valve mass.  **Discharge delayed due to missed vancomycin with dialysis - unfortunately patient doesn't have access other than HD access so override was placed with vascular team to access the line x1 for vancomycin dose. Unfortunately this delayed patient so much that transport is no longer able to collect her today. Will plan for DC in am on 30th.  Discharge Diagnoses:  Principal Problem:   Acute embolic stroke [facility]) Active Problems:   Acute metabolic encephalopathy   Cardiac valve mass   DNR (do not resuscitate) discussion   ESRD on hemodialysis ([place])   Diabetes mellitus, type II, insulin dependent ([place]), on CGM, with end organ damage: renal, neuropathy, gastroparesis, retinopathy   Hypertension associated with diabetes ([place])   Anemia in chronic kidney disease, on chronic dialysis ([place])   S/P BKA (below knee amputation), right ([place])   ESRD (end stage renal disease) on dialysis ([place])   PVD (peripheral vascular disease) ([place])   Encephalopathy   Palliative care by specialist  Acute ischemic right occipital infarcts, possibly embolic Acute metabolic encephalopathy - Presented with encephalopathy, aphasia and was nonverbal - MRI brain showed new right occipital ischemic infarcts possibly from the valvular mass/vegetation.   Neurology recommended aspirin and Plavix for 3 weeks then aspirin alone. - A1c 7.4.  LDL 135.  Continue Lipitor. - EEG negative for seizures.  LTM EEG negative for seizures.   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   -Patient reevaluated by Dr. Kipp Brood -family now requesting to be evaluated for left-sided angio vac which would need to be set up at tertiary care center, likely [facility] per his documentation.  This will be set up as an outpatient follow-up given her stable nature currently on antibiotics. -Continue cefepime vancomycin Monday Wednesday Friday  End-stage renal disease on hemodialysis -Nephrology following.  Dialysis as per nephrology MWF   History of PVD status post right BKA Currently wheelchair-bound   Anemia of chronic disease Hemoglobin stable.  Monitor intermittently.   Hyponatremia Stable   Thrombocytopenia Questionable cause.  Resolved   Diabetes mellitus type 2 with hyperglycemia A1c 7.9.  Likely uncontrolled in the setting of diet given improvement in glucose readings here on diabetic diet. Discontinue home long-acting insulin, strict diabetic diet, sliding scale insulin per previous home regimen 6 units as needed hyperglycemia.   Discharge Instructions  Discharge Instructions     Home infusion instructions   Complete by: As directed    Instructions: Flushing of vascular access device: 0.9% NaCl pre/post medication administration and prn patency; Heparin 100 u/ml, 26m for implanted ports and Heparin 10u/ml, 576mfor all other central venous catheters.      Allergies as of 12/24/2022       Reactions   Ibuprofen Other (See Comments)   CKD stage 3. Should avoid.   Dilaudid [hydromorphone Hcl] Itching, Other (See Comments)   Can take with Benadryl.   Iodinated Contrast Media Rash   Tramadol Itching        Medication List     STOP taking these medications    amLODipine-olmesartan 5-20 MG tablet Commonly known as: AZOR   azelastine 0.1 % nasal spray Commonly  known as: ASTELIN   ceFEPIme 2 g in sodium chloride 0.9 % 100 mL   Diflucan 100 MG tablet Generic drug: fluconazole   gabapentin 300 MG capsule Commonly known as: NEURONTIN   rosuvastatin 10 MG tablet Commonly known as: CRESTOR   Tresiba FlexTouch 100 UNIT/ML FlexTouch Pen Generic drug: insulin degludec       TAKE these medications    aspirin 81 MG chewable tablet Chew 1 tablet (81 mg total) by mouth daily.   atorvastatin 80 MG tablet Commonly known as: LIPITOR Take 1 tablet (80 mg total) by mouth at bedtime.   calcium acetate 667 MG capsule Commonly known as: PHOSLO Take 2,001 mg by mouth 3 (three) times daily with meals.   ceFEPime  IVPB Commonly known as: MAXIPIME Inject 2 g into the vein every Monday, Wednesday, and Friday with hemodialysis. Indication:  Culture negative IE First Dose: Yes Last Day of Therapy:  01/25/23 Labs - Once weekly:  CBC/D and BMP, Labs - Every other week:  ESR and CRP Method of administration: IV Push Method of administration may be changed at the discretion of home infusion pharmacist based upon assessment of the patient and/or caregiver's ability to self-administer the medication ordered.   clopidogrel 75 MG tablet Commonly known as: PLAVIX Take 1 tablet (75 mg total) by mouth daily for 11 days.   cromolyn 4 % ophthalmic solution Commonly known as: OPTICROM Place 1 drop into both eyes 4 (four) times daily as needed. What changed: reasons to take this   fluticasone 50 MCG/ACT nasal spray Commonly known as: FLNectar  2 sprays into both nostrils daily.   FreeStyle Libre 14 Day Sensor Misc 1 patch by Does not apply route every 14 (fourteen) days.   Insulin Pen Needle 29G X 5MM Misc 1 Device by Does not apply route 4 (four) times daily.   ketoconazole 2 % cream Commonly known as: NIZORAL Apply 1 Application topically daily as needed for irritation.   loratadine 10 MG tablet Commonly known as: CLARITIN Take 10 mg by mouth  daily.   NovoLOG FlexPen 100 UNIT/ML FlexPen Generic drug: insulin aspart Inject 6 Units into the skin 3 (three) times daily with meals. Sliding scale What changed:  when to take this reasons to take this   Olopatadine HCl 0.2 % Soln Commonly known as: Pataday Apply 1 drop to eye daily. What changed:  how to take this when to take this reasons to take this   pantoprazole 40 MG tablet Commonly known as: Protonix Take 1 tablet (40 mg total) by mouth daily.   triamcinolone ointment 0.1 % Commonly known as: KENALOG Apply topically twice daily to BODY as needed for red, sandpaper like rash.  Do not use on face, groin or armpits. What changed:  how much to take how to take this when to take this reasons to take this additional instructions   vancomycin  IVPB Inject 1,000 mg into the vein every Monday, Wednesday, and Friday with hemodialysis. Indication:  Culture negative IE First Dose: Yes Last Day of Therapy:  01/25/23 Labs - Sunday/Monday:  CBC/D, BMP, and vancomycin trough. Labs - Thursday:  BMP and vancomycin trough Labs - Every other week:  ESR and CRP Method of administration:Elastomeric Method of administration may be changed at the discretion of the patient and/or caregiver's ability to self-administer the medication ordered.   vancomycin 1-5 GM/200ML-% Soln Commonly known as: VANCOCIN Inject 200 mLs (1,000 mg total) into the vein every Monday, Wednesday, and Friday with hemodialysis.               Home Infusion Instuctions  (From admission, onward)           Start     Ordered   12/24/22 0000  Home infusion instructions       Question:  Instructions  Answer:  Flushing of vascular access device: 0.9% NaCl pre/post medication administration and prn patency; Heparin 100 u/ml, 1m for implanted ports and Heparin 10u/ml, 573mfor all other central venous catheters.   12/24/22 0942            Allergies  Allergen Reactions   Ibuprofen Other (See  Comments)    CKD stage 3. Should avoid.   Dilaudid [Hydromorphone Hcl] Itching and Other (See Comments)    Can take with Benadryl.   Iodinated Contrast Media Rash   Tramadol Itching    Consultations: ID, CT Sx, Nephrology  Procedures/Studies: CT HEAD WO CONTRAST (5MM)  Result Date: 12/17/2022 CLINICAL DATA:  Follow-up examination for acute stroke. EXAM: CT HEAD WITHOUT CONTRAST TECHNIQUE: Contiguous axial images were obtained from the base of the skull through the vertex without intravenous contrast. RADIATION DOSE REDUCTION: This exam was performed according to the departmental dose-optimization program which includes automated exposure control, adjustment of the mA and/or kV according to patient size and/or use of iterative reconstruction technique. COMPARISON:  Prior brain MRI from 12/12/2022 as well as earlier studies. FINDINGS: Brain: Cerebral volume within normal limits. Underlying moderate chronic microvascular ischemic disease noted, stable. Chronic bilateral cerebellar infarcts noted, unchanged. Continue normal expected interval evolution of small  subacute infarct involving the parasagittal left frontoparietal region. Previously identified small right occipital infarcts not visible. No other new visible acute large vessel territory infarct. No acute intracranial hemorrhage. No mass lesion or midline shift. No hydrocephalus or extra-axial fluid collection. Vascular: No abnormal hyperdense vessel. Calcified atherosclerosis present about the skull base. Skull: Scalp soft tissues and calvarium demonstrate no acute finding. Sinuses/Orbits: Globes and orbital soft tissues within normal limits. Paranasal sinuses and mastoid air cells are largely clear. Other: None. IMPRESSION: 1. Normal expected interval evolution of small subacute infarct involving the parasagittal left frontoparietal region. No other new acute intracranial abnormality. 2. Underlying moderate chronic microvascular ischemic disease  with chronic bilateral cerebellar infarcts, stable. Electronically Signed   By: Jeannine Boga M.D.   On: 12/17/2022 01:21   ECHO TEE  Result Date: 12/16/2022    TRANSESOPHOGEAL ECHO REPORT   Patient Name:   Heather [name] Date of Exam: 12/16/2022 Medical Rec #:  [id number]            Height:       69.0 in Accession #:    [id number]           Weight:       240.0 lb Date of Birth:  01-03-1971           BSA:          2.232 m Patient Age:    63 years             BP:           200/86 mmHg Patient Gender: F                    HR:           101 bpm. Exam Location:  Inpatient Procedure: 3D Echo, Transesophageal Echo, Cardiac Doppler and Color Doppler Indications:     Mitral valve mass.  History:         Patient has prior history of Echocardiogram examinations, most                  recent 12/13/2022. Abnormal ECG, Stroke, Mitral Valve Disease,                  Arrythmias:Tachycardia and Cardiac Arrest,                  Signs/Symptoms:Dyspnea and Shortness of Breath; Risk                  Factors:Diabetes. ESRD.  Sonographer:     Roseanna Rainbow RDCS Referring Phys:  [id number] [place] Diagnosing Phys: Oswaldo Milian MD PROCEDURE: After discussion of the risks and benefits of a TEE, an informed consent was obtained from the patient. The transesophogeal probe was passed without difficulty through the esophogus of the patient. Imaged were obtained with the patient in a left lateral decubitus position. Sedation performed by different physician. The patient was monitored while under deep sedation. Anesthestetic sedation was provided intravenously by Anesthesiology: 283m of Propofol. The patient's vital signs; including heart rate, blood pressure, and oxygen saturation; remained stable throughout the procedure. The patient developed no complications during the procedure.  IMPRESSIONS  1. Mass attached to posterior mitral valve calcified annulus measures 2.2cm x 0.5cm, similar in appearance to prior  TEE 09/30/22. Very mobile, prolapses across mitral valve in diastole  2. Left ventricular ejection fraction, by estimation, is 60 to 65%. The left ventricle has normal function.  3. Right ventricular systolic  function is normal. The right ventricular size is normal.  4. The mitral valve is degenerative. Mild mitral valve regurgitation. Moderate mitral annular calcification.  5. The aortic valve is tricuspid. Aortic valve regurgitation is not visualized. Aortic valve sclerosis/calcification is present, without any evidence of aortic stenosis.  6. No left atrial/left atrial appendage thrombus was detected.  7. Agitated saline contrast bubble study was positive with shunting observed within 3-6 cardiac cycles suggestive of interatrial shunt.  8. Moderate pericardial effusion. FINDINGS  Left Ventricle: Left ventricular ejection fraction, by estimation, is 60 to 65%. The left ventricle has normal function. The left ventricular internal cavity size was normal in size. Right Ventricle: The right ventricular size is normal. No increase in right ventricular wall thickness. Right ventricular systolic function is normal. Left Atrium: Left atrial size was normal in size. No left atrial/left atrial appendage thrombus was detected. Right Atrium: Right atrial size was normal in size. Pericardium: A moderately sized pericardial effusion is present. Mitral Valve: The mitral valve is degenerative in appearance. Moderate mitral annular calcification. Mild mitral valve regurgitation. Tricuspid Valve: The tricuspid valve is normal in structure. Tricuspid valve regurgitation is trivial. Aortic Valve: The aortic valve is tricuspid. Aortic valve regurgitation is not visualized. Aortic valve sclerosis/calcification is present, without any evidence of aortic stenosis. Aortic valve mean gradient measures 4.0 mmHg. Aortic valve peak gradient measures 9.1 mmHg. Pulmonic Valve: The pulmonic valve was not well visualized. Pulmonic valve  regurgitation is trivial. Aorta: The aortic root is normal in size and structure. IAS/Shunts: No atrial level shunt detected by color flow Doppler. Agitated saline contrast was given intravenously to evaluate for intracardiac shunting. Agitated saline contrast bubble study was positive with shunting observed within 3-6 cardiac cycles suggestive of interatrial shunt. Additional Comments: Spectral Doppler performed. AORTIC VALVE AV Vmax:      151.00 cm/s AV Vmean:     95.400 cm/s AV VTI:       0.278 m AV Peak Grad: 9.1 mmHg AV Mean Grad: 4.0 mmHg Oswaldo Milian MD Electronically signed by Oswaldo Milian MD Signature Date/Time: 12/16/2022/4:26:48 PM    Final    Overnight EEG with video  Result Date: 12/14/2022 Lora Havens, MD     12/15/2022 10:26 AM Patient Name: Heather [name] MRN: [id number] Epilepsy Attending: Lora Havens Referring Physician/Provider: Barb Merino, MD Duration: 12/13/2022 1531 to 12/14/2022 5364, 12/14/2022 1241 to 2030  Patient history: 51 year old female with altered mental status.  EEG to evaluate for seizure.  Level of alertness: Awake  AEDs during EEG study: None  Technical aspects: This EEG study was done with scalp electrodes positioned according to the 10-20 International system of electrode placement. Electrical activity was reviewed with band pass filter of 1-_0 , sensitivity of 7 uV/mm, display speed of 34m/sec with a _1  notched filter applied as appropriate. EEG data were recorded continuously and digitally stored.  Video monitoring was available and reviewed as appropriate.  Description: No clear posterior dominant rhythm was seen. EEG showed continuous generalized 3-5 Hz theta-delta slowing. Generalized periodic discharges with triphasic morphology were also noted at 1 to 2 Hz, more prominent when awake/stimulated. Hyperventilation and photic stimulation were not performed.   EEG was disconnected between 12/14/2022 0729 to 1241 for dialysis.   ABNORMALITY - Periodic discharges with triphasic morphology, generalized ( GPDs) - Continuous slow, generalized  IMPRESSION: This study showed generalized periodic discharges with triphasic morphology likely due to toxic -metabolic causes. Additionally there is moderate diffuse encephalopathy, nonspecific etiology. No seizures were seen  throughout the recording.  Lora Havens  ECHOCARDIOGRAM COMPLETE  Result Date: 12/13/2022    ECHOCARDIOGRAM REPORT   Patient Name:   Heather [name] Date of Exam: 12/13/2022 Medical Rec #:  [id number]            Height:       69.0 in Accession #:    [id number]           Weight:       227.0 lb Date of Birth:  1971/12/20           BSA:          2.180 m Patient Age:    81 years             BP:           203/118 mmHg Patient Gender: F                    HR:           89 bpm. Exam Location:  Inpatient Procedure: 2D Echo, Color Doppler and Cardiac Doppler Indications:    Stroke i63.9  History:        Patient has prior history of Echocardiogram examinations, most                 recent 09/30/2022. Risk Factors:Hypertension, Diabetes,                 Dyslipidemia and ESRD. History of MV Vegetation.  Sonographer:    Raquel Sarna Senior RDCS Referring Phys: [id number] [place] T TU  Sonographer Comments: Very difficult study due to body habitus and near constant patient movement. IMPRESSIONS  1. There is a mass attached to the posterior MV calcified annulus that measures 1.9 cm x 0.5 cm. This is highly mobile. This is similar to the prior echo as this was previously present. The mitral valve is degenerative. Trivial mitral valve regurgitation. Mild mitral stenosis. The mean mitral valve gradient is 5.0 mmHg with average heart rate of 87 bpm. Moderate to severe mitral annular calcification.  2. Left ventricular ejection fraction, by estimation, is 60 to 65%. The left ventricle has normal function. The left ventricle has no regional wall motion abnormalities. There is mild asymmetric left  ventricular hypertrophy of the basal-septal segment. Left ventricular diastolic function could not be evaluated.  3. Right ventricular systolic function is normal. The right ventricular size is normal. Tricuspid regurgitation signal is inadequate for assessing PA pressure.  4. A small pericardial effusion is present. The pericardial effusion is circumferential.  5. The aortic valve is tricuspid. There is mild calcification of the aortic valve. There is mild thickening of the aortic valve. Aortic valve regurgitation is not visualized. Aortic valve sclerosis/calcification is present, without any evidence of aortic stenosis. Comparison(s): No significant change from prior study. FINDINGS  Left Ventricle: Left ventricular ejection fraction, by estimation, is 60 to 65%. The left ventricle has normal function. The left ventricle has no regional wall motion abnormalities. The left ventricular internal cavity size was normal in size. There is  mild asymmetric left ventricular hypertrophy of the basal-septal segment. Left ventricular diastolic function could not be evaluated due to mitral annular calcification (moderate or greater). Left ventricular diastolic function could not be evaluated. Right Ventricle: The right ventricular size is normal. No increase in right ventricular wall thickness. Right ventricular systolic function is normal. Tricuspid regurgitation signal is inadequate for assessing PA pressure. Left Atrium: Left atrial size was normal in size. Right Atrium:  Right atrial size was normal in size. Pericardium: A small pericardial effusion is present. The pericardial effusion is circumferential. Mitral Valve: There is a mass attached to the posterior MV calcified annulus that measures 1.9 cm x 0.5 cm. This is highly mobile. This is similar to the prior echo as this was previously present. The mitral valve is degenerative in appearance. Moderate to severe mitral annular calcification. Trivial mitral valve  regurgitation. Mild mitral valve stenosis. MV peak gradient, 9.1 mmHg. The mean mitral valve gradient is 5.0 mmHg with average heart rate of 87 bpm. Tricuspid Valve: The tricuspid valve is grossly normal. Tricuspid valve regurgitation is not demonstrated. No evidence of tricuspid stenosis. Aortic Valve: The aortic valve is tricuspid. There is mild calcification of the aortic valve. There is mild thickening of the aortic valve. Aortic valve regurgitation is not visualized. Aortic valve sclerosis/calcification is present, without any evidence of aortic stenosis. Pulmonic Valve: The pulmonic valve was grossly normal. Pulmonic valve regurgitation is not visualized. No evidence of pulmonic stenosis. Aorta: The aortic root and ascending aorta are structurally normal, with no evidence of dilitation. Venous: The inferior vena cava was not well visualized. IAS/Shunts: The atrial septum is grossly normal.  LEFT VENTRICLE PLAX 2D LVIDd:         3.80 cm   Diastology LVIDs:         2.50 cm   LV e' medial:    3.70 cm/s LV PW:         1.00 cm   LV E/e' medial:  28.4 LV IVS:        1.40 cm   LV e' lateral:   4.79 cm/s LVOT diam:     1.70 cm   LV E/e' lateral: 21.9 LV SV:         37 LV SV Index:   17 LVOT Area:     2.27 cm  RIGHT VENTRICLE RV S prime:     10.60 cm/s TAPSE (M-mode): 1.2 cm LEFT ATRIUM           Index        RIGHT ATRIUM           Index LA diam:      3.80 cm 1.74 cm/m   RA Area:     14.40 cm LA Vol (A4C): 48.8 ml 22.38 ml/m  RA Volume:   33.20 ml  15.23 ml/m  AORTIC VALVE LVOT Vmax:   90.50 cm/s LVOT Vmean:  60.900 cm/s LVOT VTI:    0.162 m  AORTA Ao Root diam: 2.80 cm Ao Asc diam:  3.20 cm MITRAL VALVE MV Area (PHT): 2.42 cm     SHUNTS MV Area VTI:   1.43 cm     Systemic VTI:  0.16 m MV Peak grad:  9.1 mmHg     Systemic Diam: 1.70 cm MV Mean grad:  5.0 mmHg MV Vmax:       1.51 m/s MV Vmean:      101.0 cm/s MV Decel Time: 313 msec MV E velocity: 105.00 cm/s MV A velocity: 138.00 cm/s MV E/A ratio:  0.76 Eleonore Chiquito MD Electronically signed by Eleonore Chiquito MD Signature Date/Time: 12/13/2022/12:07:02 PM    Final    EEG adult  Result Date: 12/13/2022 Lora Havens, MD     12/13/2022  9:32 AM Patient Name: Heather [name] MRN: [id number] Epilepsy Attending: Lora Havens Referring Physician/Provider: Amie [place], MD Date: 12/13/2022 Duration: 21.38 mins Patient history: 51 year old female with altered mental  status.  EEG to evaluate for seizure. Level of alertness: Awake AEDs during EEG study: None Technical aspects: This EEG study was done with scalp electrodes positioned according to the 10-20 International system of electrode placement. Electrical activity was reviewed with band pass filter of 1-_0 , sensitivity of 7 uV/mm, display speed of 10m/sec with a _1  notched filter applied as appropriate. EEG data were recorded continuously and digitally stored.  Video monitoring was available and reviewed as appropriate. Description: No clear posterior dominant rhythm was seen.  EEG showed continuous generalized 3-5 Hz theta-delta slowing. Generalized periodic discharges with triphasic morphology were also noted at 1 to 1.5 Hz.  Hyperventilation and photic stimulation were not performed.   ABNORMALITY - Periodic discharges with triphasic morphology, generalized ( GPDs) - Continuous slow, generalized IMPRESSION: This study showed generalized periodic discharges with triphasic morphology which can be on the ictal-interictal continuum.  However the frequency and morphology is more commonly indicative of toxic -metabolic causes. Additionally there is moderate diffuse encephalopathy, nonspecific etiology. No seizures were seen throughout the recording. PLora Havens  MR BRAIN WO CONTRAST  Result Date: 12/12/2022 CLINICAL DATA:  Altered mental status.  Aphasia. EXAM: MRI HEAD WITHOUT CONTRAST TECHNIQUE: Multiplanar, multiecho pulse sequences of the brain and surrounding structures were obtained without  intravenous contrast. COMPARISON:  CT head without contrast 12/12/2022. MR head without contrast 10/21/2022. FINDINGS: Brain: 2 foci of restricted diffusion are present within the subcortical white matter of the right occipital pole. The larger measures 9 mm. T2 and FLAIR signal changes are associated with the infarcts. No other acute infarcts are present. Moderate generalized atrophy and advanced diffuse confluent periventricular and scattered subcortical T2 hyperintensities bilaterally are stable. A remote right cerebellar infarct is stable. A posterior left cerebellar infarct measures 9 mm. This is new since the prior MRI but not acute. Expected evolution of nonhemorrhagic infarct in the medial left frontal parietal cortex is noted. The ventricles are proportionate to the degree of atrophy. No significant extraaxial fluid collection is present. White matter changes extending into the brainstem are stable. Vascular: Flow is present in the major intracranial arteries. Skull and upper cervical spine: The craniocervical junction is normal. Upper cervical spine is within normal limits. Marrow signal is unremarkable. Sinuses/Orbits: The paranasal sinuses and mastoid air cells are clear. Bilateral lens replacements are noted. Exophthalmos is stable. Globes and orbits are otherwise unremarkable. IMPRESSION: 1. 2 foci of restricted diffusion within the subcortical white matter of the right occipital pole consistent with acute/subacute nonhemorrhagic infarcts. 2. 9 mm posterior left cerebellar infarct is new since the prior MRI, but not acute 3. Expected evolution of nonhemorrhagic infarct in the medial left frontal parietal cortex. 4. Moderate generalized atrophy and advanced white matter disease likely reflects the sequela of chronic microvascular ischemia. These results were called by telephone at the time of interpretation on 12/12/2022 at 7:55 pm to [facility], who verbally acknowledged these results.  Electronically Signed   By: CSan MorelleM.D.   On: 12/12/2022 19:55   DG Chest Portable 1 View  Result Date: 12/12/2022 CLINICAL DATA:  Altered mental status. EXAM: PORTABLE CHEST 1 VIEW COMPARISON:  Chest radiograph dated October 21, 2022 FINDINGS: The heart is enlarged. Mild pulmonary vascular congestion. Right IJ access dialysis catheter with distal tip in the right atrium, unchanged. Right axillary vascular stent is also unchanged. Low lung volumes without evidence of focal consolidation or pleural effusion. Thoracic spondylosis. IMPRESSION: 1. Cardiomegaly with mild pulmonary vascular congestion. 2. Low  lung volumes without evidence of focal consolidation or pleural effusion. Electronically Signed   By: Keane Police D.O.   On: 12/12/2022 15:14   CT Head Wo Contrast  Result Date: 12/12/2022 CLINICAL DATA:  Altered mental status. Nontraumatic. Patient went to bed last night around midnight normally, woke up this a.m. at 3 and was not able to talk EXAM: CT HEAD WITHOUT CONTRAST TECHNIQUE: Contiguous axial images were obtained from the base of the skull through the vertex without intravenous contrast. RADIATION DOSE REDUCTION: This exam was performed according to the departmental dose-optimization program which includes automated exposure control, adjustment of the mA and/or kV according to patient size and/or use of iterative reconstruction technique. COMPARISON:  CT examination dated October 26, 223 FINDINGS: Brain: No evidence of acute infarction, hemorrhage, hydrocephalus, extra-axial collection or mass lesion/mass effect. Low attenuation in the left paramedian frontoparietal region suggesting recent infarct. 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 Diffuse low-attenuation of the periventricular white matter presumed chronic microvascular ischemic changes. Vascular: No hyperdense vessel or unexpected calcification. Skull: Normal. Negative for fracture  or focal lesion. Sinuses/Orbits: No acute finding. Other: None. IMPRESSION: 1. No acute intracranial hemorrhage, mass effect or midline shift. MRI examination for further evaluation is suggested if clinically warranted. 2. Low attenuation in the left paramedian frontoparietal region suggesting recent infarct, similar to prior examination. 3. 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 4. Advanced chronic microvascular ischemic changes of the white matter, unchanged. Electronically Signed   By: Keane Police D.O.   On: 12/12/2022 14:18     Subjective: No acute issues/events overnight - tolerating HD well today   Discharge Exam: Vitals:   12/24/22 0828 12/24/22 0900  BP: 135/70 132/71  Pulse: 84 85  Resp: (!) 23 12  Temp:    SpO2: 97% 100%   Vitals:   12/24/22 0719 12/24/22 0809 12/24/22 0828 12/24/22 0900  BP: (!) 167/79 134/69 135/70 132/71  Pulse: 87 87 84 85  Resp: 19 18 (!) 23 12  Temp: 98 F (36.7 C) 98.2 F (36.8 C)    TempSrc: Oral Oral    SpO2:  98% 97% 100%  Weight:      Height:        General: Pt is alert, awake, not in acute distress Cardiovascular: RRR, S1/S2 +, no rubs, no gallops Respiratory: CTA bilaterally, no wheezing, no rhonchi Abdominal: Soft, NT, ND, bowel sounds + Extremities: no edema, no cyanosis  The results of significant diagnostics from this hospitalization (including imaging, microbiology, ancillary and laboratory) are listed below for reference.     Basic Metabolic Panel: Recent Labs  Lab 12/18/22 0258 12/19/22 0257 12/20/22 0327 12/21/22 0455 12/22/22 0119 12/23/22 0202 12/24/22 0326  NA 136 135 134* 133* 135 132* 135  K 3.9 4.1 4.5 4.1 4.0 3.6 4.0  CL 96* 97* 97* 100 99 98 97*  CO2 23 26 21* 20* 24 21* 21*  GLUCOSE 147* 139* 166* 126* 163* 139* 136*  BUN 41* 52* 30* 47* 56* 34* 46*  CREATININE 10.10* 12.17* 7.35* 9.97* 11.50* 7.22* 9.04*  CALCIUM 9.4 9.0 8.9 9.3 9.6 9.0 9.4  MG 2.0 2.2  --   --    --   --   --   PHOS 7.2* 7.6* 4.8* 5.4* 5.3* 3.5 4.2   Liver Function Tests: Recent Labs  Lab 12/20/22 0327 12/21/22 0455 12/22/22 0119 12/23/22 0202 12/24/22 0326  ALBUMIN 2.8* 2.8* 2.9* 2.8* 2.8*   CBC: Recent Labs  Lab 12/18/22 0258 12/19/22 0257 12/22/22 0119 12/24/22 0823  WBC 6.1 6.9 6.8 6.3  NEUTROABS 3.4 3.5  --   --   HGB 9.7* 9.4* 9.2* 8.3*  HCT 30.0* 30.3* 30.0* 26.2*  MCV 94.0 95.9 97.4 95.3  PLT 147* 167 169 164   CBG: Recent Labs  Lab 12/23/22 0647 12/23/22 1217 12/23/22 1701 12/23/22 2135 12/24/22 0614  GLUCAP 130* 189* 128* 118* 130*   Urinalysis    Component Value Date/Time   COLORURINE YELLOW 10/10/2019 0754   APPEARANCEUR TURBID (A) 10/10/2019 0754   LABSPEC 1.014 10/10/2019 0754   PHURINE 5.0 10/10/2019 0754   GLUCOSEU NEGATIVE 10/10/2019 0754   HGBUR MODERATE (A) 10/10/2019 0754   BILIRUBINUR Positive 02/01/2020 1507   BILIRUBINUR neg 08/08/2013 0000   KETONESUR NEGATIVE 10/10/2019 0754   PROTEINUR Positive (A) 02/01/2020 1507   PROTEINUR >=300 (A) 10/10/2019 0754   UROBILINOGEN 0.2 02/01/2020 1507   UROBILINOGEN 0.2 09/07/2015 1230   NITRITE Negative 02/01/2020 1507   NITRITE NEGATIVE 10/10/2019 0754   LEUKOCYTESUR Trace (A) 02/01/2020 1507   LEUKOCYTESUR LARGE (A) 10/10/2019 0754   Sepsis Labs Recent Labs  Lab 12/18/22 0258 12/19/22 0257 12/22/22 0119 12/24/22 0823  WBC 6.1 6.9 6.8 6.3   Microbiology No results found for this or any previous visit (from the past 240 hour(s)).   Time coordinating discharge: Over 30 minutes  SIGNED:   Little Ishikawa, DO [facility] 12/24/2022, 10:29 AM Pager   If 7PM-7AM, please contact night-coverage [email or web address]\

## 2022-12-24 NOTE — Progress Notes (Signed)
SLP Cancellation Note  Patient Details Name: GAVRIELLA HEARST MRN: [id number] DOB: [birth year]   Cancelled treatment:       Reason Eval/Treat Not Completed: Patient at procedure or test/unavailable (HD)   Osie Bond., M.A. [place] Office [phone]  Secure chat preferred  12/24/2022, 10:23 AM

## 2022-12-24 NOTE — Plan of Care (Signed)
Problem: Education: Goal: Knowledge of disease or condition will improve Outcome: Adequate for Discharge Goal: Knowledge of secondary prevention will improve (MUST DOCUMENT ALL) Outcome: Adequate for Discharge Goal: Knowledge of patient specific risk factors will improve Elta Gu[place] N/A or DELETE if not current risk factor) Outcome: Adequate for Discharge   Problem: Ischemic Stroke/TIA Tissue Perfusion: Goal: Complications of ischemic stroke/TIA will be minimized Outcome: Adequate for Discharge   Problem: Coping: Goal: Will verbalize positive feelings about self Outcome: Adequate for Discharge Goal: Will identify appropriate support needs Outcome: Adequate for Discharge   Problem: Health Behavior/Discharge Planning: Goal: Ability to manage health-related needs will improve Outcome: Adequate for Discharge Goal: Goals will be collaboratively established with patient/family Outcome: Adequate for Discharge   Problem: Self-Care: Goal: Ability to participate in self-care as condition permits will improve Outcome: Adequate for Discharge Goal: Verbalization of feelings and concerns over difficulty with self-care will improve Outcome: Adequate for Discharge Goal: Ability to communicate needs accurately will improve Outcome: Adequate for Discharge   Problem: Nutrition: Goal: Risk of aspiration will decrease Outcome: Adequate for Discharge Goal: Dietary intake will improve Outcome: Adequate for Discharge   Problem: Education: Goal: Ability to describe self-care measures that may prevent or decrease complications (Diabetes Survival Skills Education) will improve Outcome: Adequate for Discharge Goal: Individualized Educational Video(s) Outcome: Adequate for Discharge   Problem: Coping: Goal: Ability to adjust to condition or change in health will improve Outcome: Adequate for Discharge   Problem: Fluid Volume: Goal: Ability to maintain a balanced intake and output will  improve Outcome: Adequate for Discharge   Problem: Health Behavior/Discharge Planning: Goal: Ability to identify and utilize available resources and services will improve Outcome: Adequate for Discharge Goal: Ability to manage health-related needs will improve Outcome: Adequate for Discharge   Problem: Metabolic: Goal: Ability to maintain appropriate glucose levels will improve Outcome: Adequate for Discharge   Problem: Nutritional: Goal: Maintenance of adequate nutrition will improve Outcome: Adequate for Discharge Goal: Progress toward achieving an optimal weight will improve Outcome: Adequate for Discharge   Problem: Skin Integrity: Goal: Risk for impaired skin integrity will decrease Outcome: Adequate for Discharge   Problem: Tissue Perfusion: Goal: Adequacy of tissue perfusion will improve Outcome: Adequate for Discharge   Problem: Education: Goal: Knowledge of General Education information will improve Description: Including pain rating scale, medication(s)/side effects and non-pharmacologic comfort measures Outcome: Adequate for Discharge   Problem: Health Behavior/Discharge Planning: Goal: Ability to manage health-related needs will improve Outcome: Adequate for Discharge   Problem: Clinical Measurements: Goal: Ability to maintain clinical measurements within normal limits will improve Outcome: Adequate for Discharge Goal: Will remain free from infection Outcome: Adequate for Discharge Goal: Diagnostic test results will improve Outcome: Adequate for Discharge Goal: Respiratory complications will improve Outcome: Adequate for Discharge Goal: Cardiovascular complication will be avoided Outcome: Adequate for Discharge   Problem: Activity: Goal: Risk for activity intolerance will decrease Outcome: Adequate for Discharge   Problem: Nutrition: Goal: Adequate nutrition will be maintained Outcome: Adequate for Discharge   Problem: Coping: Goal: Level of  anxiety will decrease Outcome: Adequate for Discharge   Problem: Elimination: Goal: Will not experience complications related to bowel motility Outcome: Adequate for Discharge Goal: Will not experience complications related to urinary retention Outcome: Adequate for Discharge   Problem: Pain Managment: Goal: General experience of comfort will improve Outcome: Adequate for Discharge   Problem: Safety: Goal: Ability to remain free from injury will improve Outcome: Adequate for Discharge   Problem: Skin Integrity: Goal: Risk for  impaired skin integrity will decrease Outcome: Adequate for Discharge

## 2022-12-24 NOTE — TOC Transition Note (Addendum)
Transition of Care [facility]) - CM/SW Discharge Note   Patient Details  Name: Heather [name] MRN: [id number] Date of Birth: 07-26-1971  [facility] CM/SW Contact:  Jinger Neighbors, LCSW Phone Number: 12/24/2022, 12:56 PM   Clinical Narrative:     PT going to [facility] via [facility] Call to report: [phone] Room #: 70  CSW received messaged stating member needed to post pone transport...see MD note for further details. CSW canceled transportation and notified [facility] of admission for tomorrow and not today.   Final next level of care: Skilled Nursing Facility Barriers to Discharge: No Barriers Identified   Patient Goals and CMS Choice CMS Medicare.gov Compare Post Acute Care list provided to:: Patient Represenative (must comment) (spouse) Choice offered to / list presented to : Spouse  Discharge Placement                Patient chooses bed at: [facility] Patient to be transferred to facility by: [facility] Name of family member notified: Mr.Bayard Patient and family notified of of transfer: 12/24/22  Discharge Plan and Services Additional resources added to the After Visit Summary for                                       Social Determinants of Health (SDOH) Interventions SDOH Screenings   Food Insecurity: No Food Insecurity (12/15/2022)  Housing: Low Risk  (12/15/2022)  Transportation Needs: No Transportation Needs (12/15/2022)  Utilities: Not At Risk (12/15/2022)  Depression (PHQ2-9): Low Risk  (10/26/2022)  Financial Resource Strain: Low Risk  (10/26/2022)  Physical Activity: Inactive (10/26/2022)  Social Connections: Moderately Integrated (10/26/2022)  Stress: No Stress Concern Present (10/26/2022)  Tobacco Use: Low Risk  (12/20/2022)     Readmission Risk Interventions     No data to display

## 2022-12-24 NOTE — Progress Notes (Signed)
Pt to d/c to [facility] today. Contacted [facility] to make clinic aware of pt's d/c today and that pt will resume on Sunday (holiday schedule). CSW advised of this schedule yesterday so snf could be made aware as well. Clinic also advised pt will require IV abx with HD and [facility] staff to send orders.   Melven Sartorius Renal Navigator [phone]

## 2022-12-30 ENCOUNTER — Encounter (HOSPITAL_COMMUNITY): Payer: Self-pay | Admitting: *Deleted

## 2023-01-04 ENCOUNTER — Encounter (HOSPITAL_COMMUNITY): Payer: Self-pay | Admitting: *Deleted

## 2023-01-04 ENCOUNTER — Ambulatory Visit (INDEPENDENT_AMBULATORY_CARE_PROVIDER_SITE_OTHER): Payer: 59 | Admitting: Family Medicine

## 2023-01-04 ENCOUNTER — Encounter: Payer: Self-pay | Admitting: Family Medicine

## 2023-01-04 VITALS — BP 150/86 | HR 91 | Temp 98.4°F | Ht 69.0 in | Wt 227.0 lb

## 2023-01-04 DIAGNOSIS — E1159 Type 2 diabetes mellitus with other circulatory complications: Secondary | ICD-10-CM

## 2023-01-04 DIAGNOSIS — N186 End stage renal disease: Secondary | ICD-10-CM

## 2023-01-04 DIAGNOSIS — I058 Other rheumatic mitral valve diseases: Secondary | ICD-10-CM

## 2023-01-04 DIAGNOSIS — I152 Hypertension secondary to endocrine disorders: Secondary | ICD-10-CM

## 2023-01-04 DIAGNOSIS — Z8673 Personal history of transient ischemic attack (TIA), and cerebral infarction without residual deficits: Secondary | ICD-10-CM | POA: Diagnosis not present

## 2023-01-04 DIAGNOSIS — I639 Cerebral infarction, unspecified: Secondary | ICD-10-CM

## 2023-01-04 DIAGNOSIS — E119 Type 2 diabetes mellitus without complications: Secondary | ICD-10-CM

## 2023-01-04 DIAGNOSIS — Z794 Long term (current) use of insulin: Secondary | ICD-10-CM

## 2023-01-04 DIAGNOSIS — Z992 Dependence on renal dialysis: Secondary | ICD-10-CM

## 2023-01-04 MED ORDER — TIRZEPATIDE 5 MG/0.5ML ~~LOC~~ SOAJ: 5.0000 mg | SUBCUTANEOUS | 0 refills | Status: DC

## 2023-01-04 MED ORDER — GABAPENTIN 100 MG PO CAPS: 100.0000 mg | ORAL_CAPSULE | ORAL | 3 refills | Status: DC

## 2023-01-04 MED ORDER — TIRZEPATIDE 2.5 MG/0.5ML ~~LOC~~ SOAJ: 2.5000 mg | SUBCUTANEOUS | 0 refills | Status: DC

## 2023-01-04 NOTE — Progress Notes (Signed)
Heather [name] is a 52 y.o. female who presents today for an office visit.  Assessment/Plan:  Problems Addressed Today: CVA (cerebral vascular accident) ([place]) [name] doing well without any focal residual deficits.  Still has some cognitive slowing that seems to be recovering.  Will continue management per neurology.  [name] on dual antiplatelet therapy for 21 days to be followed by aspirin alone.  Thought that her stroke was likely embolic in nature and she is having her mitral valve mass managed per ID and cardiology as below.  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  [name] getting this with dialysis.  She does have upcoming appointment with cardiothoracic surgery.  If her mitral valve mass does not resolve with IV antibiotics they will be referring her to tertiary care center for further management.  Hypertension associated with diabetes ([place]) Blood pressure is slightly elevated today though she has been having issues with labile blood pressure readings and is getting symptomatic lows during dialysis.  She has been prescribed midodrine to use as needed.  She believes this came from her nephrologist.  They will continue to monitor at home.  Will not add on any additional antihypertensives at this point given her previous issues with hypotension.  Diabetes mellitus, type II, insulin dependent ([place]), on CGM, with end organ damage: renal, neuropathy, gastroparesis, retinopathy A1c elevated 7.9 in the hospital.  Will add on Mounjaro.  Hopefully this will give some extra cardiovascular protection as well.  Start 2.5 mg weekly for 4 weeks and then increase to 5 mg weekly.  We discussed potential side effects.  She will continue current dose of other medications including tresiba was 6 units daily and NovoLog 6 units 3 times daily with meals as needed for high blood sugar.  ESRD on hemodialysis [facility]) Continue management per nephrology.  Gets hemodialysis 3 times weekly  MWF.      Subjective:  HPI:  Patient here for hospitalization follow up. She presented to the ED on 12/12/2022 with weakness, confunsion, and lethargy.  Also found to have dysphagia and dysarthria.  MRI brain was obtained which showed new acute ischemic infarct and the right occipital pole as well as a new nonacute left cerebellar infarct.  Neurology was consulted.  Cardiology was consulted as well.  She underwent TEE which showed persistent mitral valve mass.  She was continued on IV antibiotics with dialysis with vancomycin and cefepime.  Her neurologic symptoms including dysphagia and dysarthria improved.  She was discharged to SNF.  She was there for a few weeks and then came home yesterday.  She has been following with cardiothoracic surgery.  The plan was to refer her to [facility] for consideration for further management however per patient they are waiting to see how she responds to her IV antibiotics before referral to [facility].  She has been compliant with dialysis.  Her speech and swallowing is almost back to baseline though she does still feel a bit slower than normal.  ROS: Per HPI, otherwise a complete review of systems was negative.   PMH:  The following were reviewed and entered/updated in epic: 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[place]), nonhemorrhagic, inferior right cerebellum 12/03/2017   left side weakness in leg   Cyst of  left ovary 01/12/2018   Diabetic neuropathy, painful ([place]), on low dose Gabapentin 07/13/2013   DM (diabetes mellitus), type 2, uncontrolled, with renal complications 56/81/2751   DM2 (diabetes mellitus, type 2) ([place])    Dysfunction of left eustachian tube, with pusatile tinnitus 11/24/2017   Dyslipidemia associated with type 2 diabetes  mellitus ([place]) 06/25/2016   ESRD (end stage renal disease) on dialysis [facility])    On hemodialysis in July 2019 via [facility] then switched to [facility] in Nov 2019.    ESRD with anemia (HCC)    Fibromyalgia    Gastroparesis due to DM    GERD (gastroesophageal reflux disease)    Headache    Hypertension associated with diabetes ([place]) 02/19/2011   IBS (irritable bowel syndrome)    Left-sided weakness 12/10/2017   .   Leiomyoma of uterus    Pancreatitis    PE (pulmonary thromboembolism) ([place]) 11/04/2019   Pneumonia    Proliferative diabetic retinopathy ([place]) 09/29/2015   Pronation deformity of both feet 06/14/2014   Reactive depression 12/10/2017   .   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[place]) 02/17/2018   Thromboembolism ([place]) 04/05/2018   Upper airway cough syndrome, with recs to stay off ACE and take Pepcid q hs 11/15/2016   Urticaria    Patient Active Problem List   Diagnosis Date Noted   Encephalopathy 12/14/2022   Cardiac valve mass 12/14/2022   DNR (do not resuscitate) discussion 12/14/2022   PVD (peripheral vascular disease) ([place]) 12/12/2022   Allergic conjunctivitis 10/12/2022   Mitral valve mass    Right atrial mass    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   Shortness of breath 07/02/2022   Thrombus of venous dialysis catheter ([place]) 02/25/2022   ESRD (end stage renal disease) on dialysis ([place]) 12/28/2021   Displacement of vascular dialysis catheter, initial encounter ([place]) 12/18/2021   S/P BKA (below knee amputation), right ([place]) 03/31/2021   Impaired mobility and ADLs 03/25/2021   CVA (cerebral vascular accident) ([place]) 03/04/2021   Pulmonary embolus ([place]) 03/04/2021   Charcot ankle, right 02/23/2021   Hx of cardiac arrest 11/12/2020   Abnormal CT of the chest 09/26/2020   Allergy, unspecified, initial encounter 09/22/2020   Other mechanical complication of other cardiac and vascular  devices and implants, initial encounter ([place]) 05/10/2020   Positive ANA (antinuclear antibody) 04/16/2020   ESRD on hemodialysis ([place])    Hemiplegia and hemiparesis following cerebral infarction affecting left non-dominant side ([place]) 12/29/2018   IBS (irritable bowel syndrome) 08/06/2017   Dyslipidemia associated with type 2 diabetes mellitus ([place]) 06/25/2016   Proliferative diabetic retinopathy ([place]) 09/29/2015   Chronic cough 10/04/2014   B12 deficiency 05/10/2014   Anemia in chronic kidney disease, on chronic dialysis ([place]) 04/26/2014   CKD (chronic kidney disease) stage 5, GFR less than 15 ml/min (HCC) 04/26/2014   Secondary hyperparathyroidism of renal origin ([place]) 11/27/2013   Posterior subcapsular cataract, bilateral 10/18/2013   Diabetes mellitus, type II, insulin dependent ([place]), on CGM, with end organ damage: renal, neuropathy, gastroparesis, retinopathy 02/19/2011   Hypertension associated with diabetes ([place]) 02/19/2011   Gastroparesis due to DM ([place]) 02/19/2011   Past Surgical History:  Procedure Laterality Date   A/V FISTULAGRAM Right 03/03/2020   Procedure: Venogram;  Surgeon: Waynetta Sandy, MD;  Location: [place] [facility];  Service: Cardiovascular;  Laterality: Right;   ARTERY REPAIR Right 02/17/2018   Procedure: EXPLORATION OF RIGHT BRACHIAL ARTERY;  Surgeon: [name],  Jannette Fogo, MD;  Location: [place];  Service: Vascular;  Laterality: Right;   ARTERY REPAIR Right 02/17/2018   Procedure: BRACHIAL ARTERY EXPLORATION AND TRHOMBECTOMY;  Surgeon: Conrad Ridgeville, MD;  Location: [facility] OR;  Service: Vascular;  Laterality: Right;   AV FISTULA PLACEMENT Left 05/09/2017   Procedure: INSERTION OF ARTERIOVENOUS (AV) GRAFT ARM (ARTEGRAFT);  Surgeon: Conrad [place], MD;  Location: [place];  Service: Vascular;  Laterality: Left;   AV FISTULA PLACEMENT Right 02/17/2018   Procedure: INSERTION OF ARTERIOVENOUS (AV) GORE-TEX GRAFT ARM RIGHT UPPER ARM;  Surgeon: Conrad Newell, MD;  Location: [place];   Service: Vascular;  Laterality: Right;   AV FISTULA PLACEMENT Left 10/10/2019   Procedure: INSERTION OF LEFT ARTERIOVENOUS (AV) ARTEGRAFT GRAFT ARM;  Surgeon: Waynetta Sandy, MD;  Location: [place];  Service: Vascular;  Laterality: Left;   AV FISTULA PLACEMENT Right 03/19/2020   Procedure: INSERTION OF ARTERIOVENOUS (AV) GORE-TEX GRAFT IN RIGHT ARM AND VIABAHN STENT IN RIGHT AXILLARY ARTERY;  Surgeon: Waynetta Sandy, MD;  Location: [place];  Service: Vascular;  Laterality: Right;   [place] Left 08/31/2016   Procedure: BASILIC VEIN TRANSPOSITION FIRST STAGE;  Surgeon: Conrad Orw[place], MD;  Location: [place];  Service: Vascular;  Laterality: Left;   BRONCHIAL WASHINGS  09/17/2021   Procedure: BRONCHIAL WASHINGS;  Surgeon: Collene Gobble, MD;  Location: [facility];  Service: Cardiopulmonary;;   BUBBLE STUDY  12/16/2022   Procedure: BUBBLE STUDY;  Surgeon: Donato Heinz, MD;  Location: [place];  Service: Cardiovascular;;   CENTRAL VENOUS CATHETER INSERTION Left 07/24/2018   Procedure: INSERTION CENTRAL LINE ADULT;  Surgeon: Conrad [place], MD;  Location: [place];  Service: Vascular;  Laterality: Left;   EYE SURGERY     secondary to diabetic retinopathy    FOOT SURGERY Right    t"ook bone out- maybe hammer toe"   INSERTION OF DIALYSIS CATHETER Left 07/24/2018   Procedure: INSERTION OF TUNNELED DIALYSIS CATHETER;  Surgeon: Conrad [place]wood, MD;  Location: [place];  Service: Vascular;  Laterality: Left;   IR FLUORO GUIDE CV LINE LEFT  12/19/2021   IR FLUORO GUIDE CV LINE LEFT  12/29/2021   IR FLUORO GUIDE CV LINE RIGHT  07/21/2018   IR FLUORO GUIDE CV LINE RIGHT  06/01/2019   IR FLUORO GUIDE CV LINE RIGHT  05/05/2020   IR FLUORO GUIDE CV LINE RIGHT  10/04/2022   IR REMOVAL TUN CV CATH W/O FL  01/23/2021   IR REMOVAL TUN CV CATH W/O FL  12/19/2021   IR REMOVAL TUN CV CATH W/O FL  10/01/2022   IR US GUIDE VASC ACCESS LEFT  12/19/2021   IR US GUIDE VASC ACCESS RIGHT   07/21/2018   IR US GUIDE VASC ACCESS RIGHT  06/01/2019   IR US GUIDE VASC ACCESS RIGHT  10/04/2022   LIGATION ARTERIOVENOUS GORTEX GRAFT Right 03/20/2020   Procedure: LIGATION ARM ARTERIOVENOUS GORTEX GRAFT With Patch Angioplasty, Thrombectomy;  Surgeon: Waynetta Sandy, MD;  Location: [place];  Service: Vascular;  Laterality: Right;   LIGATION OF ARTERIOVENOUS  FISTULA Left 05/09/2017   Procedure: LIGATION OF ARTERIOVENOUS  FISTULA;  Surgeon: Conrad Milton, MD;  Location: [place];  Service: Vascular;  Laterality: Left;   LOOP RECORDER INSERTION N/A 12/05/2017   Procedure: LOOP RECORDER INSERTION;  Surgeon: Evans Lance, MD;  Location: [place] [facility];  Service: Cardiovascular;  Laterality: N/A;   MYOMECTOMY     REMOVAL  OF GRAFT Right 02/17/2018   Procedure: REMOVAL OF RIGHT UPPER ARM ARTERIOVENOUS GRAFT;  Surgeon: Conrad [place], MD;  Location: [place];  Service: Vascular;  Laterality: Right;   REVISION OF ARTERIOVENOUS GORETEX GRAFT Left 07/24/2018   Procedure: REDO ARTERIOVENOUS GORETEX GRAFT;  Surgeon: Conrad [place]skridge, MD;  Location: [place];  Service: Vascular;  Laterality: Left;   TEE WITHOUT CARDIOVERSION N/A 12/05/2017   Procedure: TRANSESOPHAGEAL ECHOCARDIOGRAM (TEE);  Surgeon: Sanda Klein, MD;  Location: [place];  Service: Cardiovascular;  Laterality: N/A;   TEE WITHOUT CARDIOVERSION N/A 09/30/2022   Procedure: TRANSESOPHAGEAL ECHOCARDIOGRAM (TEE);  Surgeon: Jerline Pain, MD;  Location: [facility];  Service: Cardiovascular;  Laterality: N/A;   TEE WITHOUT CARDIOVERSION N/A 12/16/2022   Procedure: TRANSESOPHAGEAL ECHOCARDIOGRAM (TEE);  Surgeon: Donato Heinz, MD;  Location: [facility];  Service: Cardiovascular;  Laterality: N/A;   UPPER EXTREMITY VENOGRAPHY Left 07/13/2018   Procedure: UPPER EXTREMITY VENOGRAPHY - Central & Left Arm;  Surgeon: Conrad Tulia, MD;  Location: [place] [facility];  Service: Cardiovascular;  Laterality: Left;   UPPER EXTREMITY VENOGRAPHY  Bilateral 09/10/2019   Procedure: UPPER EXTREMITY VENOGRAPHY;  Surgeon: Waynetta Sandy, MD;  Location: [place] [facility];  Service: Cardiovascular;  Laterality: Bilateral;   UTERINE FIBROID SURGERY     VIDEO BRONCHOSCOPY N/A 09/17/2021   Procedure: VIDEO BRONCHOSCOPY WITHOUT FLUORO;  Surgeon: Collene Gobble, MD;  Location: [facility];  Service: Cardiopulmonary;  Laterality: N/A;   VITRECTOMY Bilateral     Family History  Problem Relation Age of Onset   Colon polyps Mother    Diabetes Mother    Heart murmur Father        history essentially unknown   Hypertension Father    Diabetes Sister    Kidney failure Sister        kidney transplant   Diabetes Brother    Retinal degeneration Brother    Heart murmur Son    Stroke Paternal Grandmother    Diabetes Sister     Medications- reviewed and updated Current Outpatient Medications  Medication Sig Dispense Refill   aspirin 81 MG chewable tablet Chew 1 tablet (81 mg total) by mouth daily. 30 tablet 0   atorvastatin (LIPITOR) 80 MG tablet Take 1 tablet (80 mg total) by mouth at bedtime. 30 tablet 0   calcium acetate (PHOSLO) 667 MG capsule Take 2,001 mg by mouth 3 (three) times daily with meals.     ceFEPime (MAXIPIME) IVPB Inject 2 g into the vein every Monday, Wednesday, and Friday with hemodialysis. Indication:  Culture negative IE First Dose: Yes Last Day of Therapy:  01/25/23 Labs - Once weekly:  CBC/D and BMP, Labs - Every other week:  ESR and CRP Method of administration: IV Push Method of administration may be changed at the discretion of home infusion pharmacist based upon assessment of the patient and/or caregiver's ability to self-administer the medication ordered. 20 Units 0   clopidogrel (PLAVIX) 75 MG tablet Take 1 tablet (75 mg total) by mouth daily for 11 days. 11 tablet 0   Continuous Blood Gluc Sensor (FREESTYLE LIBRE 14 DAY SENSOR) MISC 1 patch by Does not apply route every 14 (fourteen) days. 6 each 5    cromolyn (OPTICROM) 4 % ophthalmic solution Place 1 drop into both eyes 4 (four) times daily as needed. (Patient taking differently: Place 1 drop into both eyes 4 (four) times daily as needed (for allergies).) 10 mL 3   fluticasone (FLONASE) 50 MCG/ACT  nasal spray Place 2 sprays into both nostrils daily. 18.2 mL 2   [START ON 01/05/2023] gabapentin (NEURONTIN) 100 MG capsule Take 1 capsule (100 mg total) by mouth 3 (three) times a week. With Dialysis 90 capsule 3   insulin aspart (NOVOLOG FLEXPEN) 100 UNIT/ML FlexPen Inject 6 Units into the skin 3 (three) times daily with meals. Sliding scale (Patient taking differently: Inject 6 Units into the skin daily as needed for high blood sugar. Sliding scale) 15 mL    insulin degludec (TRESIBA FLEXTOUCH) 100 UNIT/ML FlexTouch Pen Inject 26 Units into the skin daily.     Insulin Pen Needle 29G X 5MM MISC 1 Device by Does not apply route 4 (four) times daily. 400 each 3   ketoconazole (NIZORAL) 2 % cream Apply 1 Application topically daily as needed for irritation.     loratadine (CLARITIN) 10 MG tablet Take 10 mg by mouth daily.     Olopatadine HCl (PATADAY) 0.2 % SOLN Apply 1 drop to eye daily. (Patient taking differently: Place 1 drop into both eyes daily as needed (dry eyes).) 2.5 mL 3   pantoprazole (PROTONIX) 40 MG tablet Take 1 tablet (40 mg total) by mouth daily. 30 tablet 3   tirzepatide (MOUNJARO) 2.5 MG/0.5ML Pen Inject 2.5 mg into the skin once a week. 2 mL 0   tirzepatide (MOUNJARO) 5 MG/0.5ML Pen Inject 5 mg into the skin once a week. 6 mL 0   triamcinolone ointment (KENALOG) 0.1 % Apply topically twice daily to BODY as needed for red, sandpaper like rash.  Do not use on face, groin or armpits. (Patient taking differently: Apply 1 Application topically daily as needed (For rash).) 80 g 1   vancomycin (VANCOCIN) 1-5 GM/200ML-% SOLN Inject 200 mLs (1,000 mg total) into the vein every Monday, Wednesday, and Friday with hemodialysis. 4000 mL     vancomycin IVPB Inject 1,000 mg into the vein every Monday, Wednesday, and Friday with hemodialysis. Indication:  Culture negative IE First Dose: Yes Last Day of Therapy:  01/25/23 Labs - Sunday/Monday:  CBC/D, BMP, and vancomycin trough. Labs - Thursday:  BMP and vancomycin trough Labs - Every other week:  ESR and CRP Method of administration:Elastomeric Method of administration may be changed at the discretion of the patient and/or caregiver's ability to self-administer the medication ordered. 20 Units 0   No current facility-administered medications for this visit.    Allergies-reviewed and updated Allergies  Allergen Reactions   Ibuprofen Other (See Comments)    CKD stage 3. Should avoid.   Dilaudid [Hydromorphone Hcl] Itching and Other (See Comments)    Can take with Benadryl.   Iodinated Contrast Media Rash   Tramadol Itching    Social History   Socioeconomic History   Marital status: Married    Spouse name: Not on file   Number of children: 1   Years of education: college   Highest education level: Not on file  Occupational History   Occupation: customer service rep    Employer: [employer]  Tobacco Use   Smoking status: Never    Passive exposure: Never   Smokeless tobacco: Never  Vaping Use   Vaping Use: Never used  Substance and Sexual Activity   Alcohol use: No    Alcohol/week: 0.0 standard drinks of alcohol   Drug use: No   Sexual activity: Not Currently    Partners: Male    Birth control/protection: I.U.D.  Other Topics Concern   Not on file  Social History  Narrative   Patient lives with fiance. She has 1 grown son. Works full-time, she Paramedic for attorney.   Caffeine Use: 2 cups daily; sodas occasionally   She has 7 grandchildren   Social Determinants of Health   Financial Resource Strain: Low Risk  (10/26/2022)   Overall Financial Resource Strain (CARDIA)    Difficulty of Paying Living Expenses: Not hard at all  Food Insecurity: No  Food Insecurity (12/15/2022)   Hunger Vital Sign    Worried About Running Out of Food in the Last Year: Never true    Ran Out of Food in the Last Year: Never true  Transportation Needs: No Transportation Needs (12/15/2022)   PRAPARE - Hydrologist (Medical): No    Lack of Transportation (Non-Medical): No  Physical Activity: Inactive (10/26/2022)   Exercise Vital Sign    Days of Exercise per Week: 0 days    Minutes of Exercise per Session: 0 min  Stress: No Stress Concern Present (10/26/2022)   Wing    Feeling of Stress : Not at all  Social Connections: Moderately Integrated (10/26/2022)   Social Connection and Isolation Panel [NHANES]    Frequency of Communication with Friends and Family: More than three times a week    Frequency of Social Gatherings with Friends and Family: More than three times a week    Attends Religious Services: More than 4 times per year    Active Member of [employer] or Organizations: No    Attends Music therapist: Never    Marital Status: Married          Objective:  Physical Exam: BP (!) 150/86   Pulse 91   Temp 98.4 F (36.9 C) (Temporal)   Ht '5\' 9"'$  (1.753 m)   Wt 227 lb (103 kg)   LMP 09/10/2021   SpO2 96%   BMI 33.52 kg/m   Gen: No acute distress, resting comfortably CV: Regular rate and rhythm with no murmurs appreciated Pulm: Normal work of breathing, clear to auscultation bilaterally with no crackles, wheezes, or rhonchi Neuro: Grossly normal, moves all extremities Psych: Normal affect and thought content  Time Spent: 45 minutes of total time was spent on the date of the encounter performing the following actions: chart review prior to seeing the patient including her recent hospitalization, obtaining history, performing a medically necessary exam, counseling on the treatment plan, placing orders, and documenting in our EHR.         [name]. Jerline Pain, MD 01/04/2023 1:38 PM

## 2023-01-04 NOTE — Assessment & Plan Note (Signed)
[name] doing well without any focal residual deficits.  Still has some cognitive slowing that seems to be recovering.  Will continue management per neurology.  [name] on dual antiplatelet therapy for 21 days to be followed by aspirin alone.  Thought that her stroke was likely embolic in nature and she is having her mitral valve mass managed per ID and cardiology as below.

## 2023-01-04 NOTE — Patient Instructions (Signed)
It was very nice to see you today!  I am glad that you are doing better.  We will restart your gabapentin.  We will start Mounjaro.  Please let me know how this is working for you in a few weeks.  We will see you back in 3 months to recheck A1c.  Come back sooner if needed.  Take care, Dr Jerline Pain  PLEASE NOTE:  If you had any lab tests, please let us know if you have not heard back within a few days. You may see your results on mychart before we have a chance to review them but we will give you a call once they are reviewed by [name].   If we ordered any referrals today, please let us know if you have not heard from their office within the next week.   If you had any urgent prescriptions sent in today, please check with the pharmacy within an hour of our visit to make sure the prescription was transmitted appropriately.   Please try these tips to maintain a healthy lifestyle:  Eat at least 3 REAL meals and 1-2 snacks per day.  Aim for no more than 5 hours between eating.  If you eat breakfast, please do so within one hour of getting up.   Each meal should contain half fruits/vegetables, one quarter protein, and one quarter carbs (no bigger than a computer mouse)  Cut down on sweet beverages. This includes juice, soda, and sweet tea.   Drink at least 1 glass of water with each meal and aim for at least 8 glasses per day  Exercise at least 150 minutes every week.

## 2023-01-04 NOTE — Assessment & Plan Note (Signed)
Currently on IV vancomycin and cefepime per ID.  [name] getting this with dialysis.  She does have upcoming appointment with cardiothoracic surgery.  If her mitral valve mass does not resolve with IV antibiotics they will be referring her to tertiary care center for further management.

## 2023-01-04 NOTE — Assessment & Plan Note (Signed)
Continue management per nephrology.  Gets hemodialysis 3 times weekly MWF.

## 2023-01-04 NOTE — Assessment & Plan Note (Signed)
A1c elevated 7.9 in the hospital.  Will add on Mounjaro.  Hopefully this will give some extra cardiovascular protection as well.  Start 2.5 mg weekly for 4 weeks and then increase to 5 mg weekly.  We discussed potential side effects.  She will continue current dose of other medications including tresiba was 6 units daily and NovoLog 6 units 3 times daily with meals as needed for high blood sugar.

## 2023-01-04 NOTE — Assessment & Plan Note (Signed)
Blood pressure is slightly elevated today though she has been having issues with labile blood pressure readings and is getting symptomatic lows during dialysis.  She has been prescribed midodrine to use as needed.  She believes this came from her nephrologist.  They will continue to monitor at home.  Will not add on any additional antihypertensives at this point given her previous issues with hypotension.

## 2023-01-05 ENCOUNTER — Telehealth: Payer: Self-pay | Admitting: Family Medicine

## 2023-01-05 ENCOUNTER — Other Ambulatory Visit: Payer: Self-pay | Admitting: *Deleted

## 2023-01-05 DIAGNOSIS — N186 End stage renal disease: Secondary | ICD-10-CM

## 2023-01-05 NOTE — Patient Outreach (Signed)
Verified in [facility] Mrs. Heather [name] discharged from [facility] skilled nursing facility on 01/03/23. Screening for [facility] care coordination services as benefit of insurance plan and Primary Care Provider.   Will make referral to [facility] care coordination team for RN services. Will follow up with [facility] workers to inquire about home health arrangements.   Mrs. Heather [name] has medical history of CVAs, ESRD on HD, DMII, gastroparesis, PE/DVT, HTN, RBKA, PVD.   Marthenia Rolling, MSN, RN,BSN [place] Acute Care Coordinator [phone] (Direct dial)

## 2023-01-05 NOTE — Telephone Encounter (Signed)
Pt would like a call back about a medication that was RX to pt. She has questions on meds.

## 2023-01-07 ENCOUNTER — Other Ambulatory Visit: Payer: Self-pay | Admitting: *Deleted

## 2023-01-07 ENCOUNTER — Telehealth: Payer: Self-pay | Admitting: Family Medicine

## 2023-01-07 NOTE — Telephone Encounter (Signed)
Left message to return call to our office at their convenience.

## 2023-01-07 NOTE — Patient Outreach (Addendum)
Late entry for 01/06/23  Mrs. Heather [name] discharged from [facility] skilled nursing facility on 01/03/23. Spoke with Heather [name], Education officer, museum at [facility]. Skilled nursing facility discharge was patient driven. Mrs. Heather [name] had [facility] home [facility] arranged prior to discharge.   Heather Rolling, MSN, RN,BSN [place] Acute Care Coordinator [phone] (Direct dial)

## 2023-01-07 NOTE — Telephone Encounter (Signed)
Spoke with patient, stated Rx Heather [name] is $1000  PA done (Key: [id number]) Outcome no additional PA is required. Mounjaro 2.'5MG'$ /0.5ML pen-injectors  Call patient to give information  LVM to return call

## 2023-01-07 NOTE — Telephone Encounter (Signed)
Patient requests to be called regarding: Patient states she went to the Pharmacy to pick up RX for [facility] but the cost is $1,000. Patient states she cannot afford the above medication. Requests to be advised if there is an affordable way of getting Mounjaro or if there is a way of Patient getting help.

## 2023-01-07 NOTE — Telephone Encounter (Signed)
See previews note 

## 2023-01-07 NOTE — Telephone Encounter (Signed)
Patient returned call and requests to be called.

## 2023-01-12 ENCOUNTER — Other Ambulatory Visit: Payer: Self-pay

## 2023-01-12 ENCOUNTER — Ambulatory Visit (INDEPENDENT_AMBULATORY_CARE_PROVIDER_SITE_OTHER): Payer: Medicare Other | Admitting: Internal Medicine

## 2023-01-12 VITALS — BP 107/65 | HR 91 | Temp 97.2°F

## 2023-01-12 DIAGNOSIS — I38 Endocarditis, valve unspecified: Secondary | ICD-10-CM

## 2023-01-12 DIAGNOSIS — I058 Other rheumatic mitral valve diseases: Secondary | ICD-10-CM | POA: Diagnosis not present

## 2023-01-12 DIAGNOSIS — I5189 Other ill-defined heart diseases: Secondary | ICD-10-CM | POA: Diagnosis not present

## 2023-01-12 NOTE — Assessment & Plan Note (Signed)
Unclear if this truly has represented infective endocarditis as would expect a vegetation to have some resorption following initial prolonged antibiotic therapy like she received.  Additionally, all blood cultures have been negative and she has not had any infectious symptoms to really speak of.  Nonetheless, she is completing her second course of antibiotics for 6 weeks which will conclude on 01/25/23.    Further workup for atypical ID etiologies has also been unrevealing as well as workup for nonbacterial thrombotic endocarditis.  She was seen by CT surgery during her recent admit whom discussed her options and plan was for her to be evaluated at [facility] for possible Angiovac procedure.    Will continue cefepime and vancomycin as planned with HD through 01/25/23.  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  Will refer to CT surgery today to determine if they need to see her in clinic or whether they will refer directly to [facility] from prior notes.  Will also repeat her TTE and follow up in 4 weeks.

## 2023-01-12 NOTE — Progress Notes (Addendum)
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   She was admitted at [facility] from 12/12/22-12/24/22 due to the above.  [name] status post 6 weeks of vancomycin and cefepime due to concern for mitral valve endocarditis that was previously completed in mid-November.  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  Follow-up MRI this most recent admission showed new infarcts from prior as well.    TEE was done 12/16/2022 which showed similar appearance of mitral valve mass from her prior TEE in October.  She was seen by Dr Kipp Brood during her admission who discussed options for her.  They were interested in [place] procedure which would need to be done through [facility] given left sided involvement.  She was discharged to SNF on vancomycin and cefepime with HD through 01/25/23 with plan for CT surgery evaluation as an outpatient.  [name] now back home after short stay at a SNF.  She feels well today and has no complaints.  No fevers, chills.  [name] getting abx with her HD and has no issues.  She has not seen nor heard about CT surgery follow up as of yet.    Please see A&P for the details of today's visit and status of the patient's medical problems.   Patient's Medications  New Prescriptions   No medications on file  Previous Medications   ASPIRIN 81 MG CHEWABLE TABLET    Chew 1 tablet (81 mg total) by mouth daily.   ATORVASTATIN (LIPITOR) 80 MG TABLET    Take 1 tablet (80 mg total) by mouth at bedtime.   CALCIUM ACETATE (PHOSLO) 667 MG CAPSULE    Take 2,001 mg by mouth 3 (three) times daily with meals.   CEFEPIME (MAXIPIME) IVPB    Inject 2 g into the vein every Monday, Wednesday, and Friday with  hemodialysis. Indication:  Culture negative IE First Dose: Yes Last Day of Therapy:  01/25/23 Labs - Once weekly:  CBC/D and BMP, Labs - Every other week:  ESR and CRP Method of administration: IV Push Method of administration may be changed at the discretion of home infusion pharmacist based upon assessment of the patient and/or caregiver's ability to self-administer the medication ordered.   CONTINUOUS BLOOD GLUC SENSOR (FREESTYLE LIBRE 14 DAY SENSOR) MISC    1 patch by Does not apply route every 14 (fourteen) days.   CROMOLYN (OPTICROM) 4 % OPHTHALMIC SOLUTION    Place 1 drop into both eyes 4 (four) times daily as needed.   FLUTICASONE (FLONASE) 50 MCG/ACT NASAL SPRAY    Place 2 sprays into both nostrils daily.   GABAPENTIN (NEURONTIN) 100 MG CAPSULE    Take 1 capsule (100 mg total) by mouth 3 (three) times a week. With Dialysis   INSULIN ASPART (NOVOLOG FLEXPEN) 100 UNIT/ML FLEXPEN    Inject 6 Units into the skin 3 (three) times daily with meals. Sliding scale   INSULIN DEGLUDEC (TRESIBA FLEXTOUCH) 100 UNIT/ML FLEXTOUCH PEN    Inject 26 Units into the skin daily.   INSULIN PEN NEEDLE 29G X 5MM MISC    1 Device by Does not apply route 4 (four) times daily.  KETOCONAZOLE (NIZORAL) 2 % CREAM    Apply 1 Application topically daily as needed for irritation.   LORATADINE (CLARITIN) 10 MG TABLET    Take 10 mg by mouth daily.   OLOPATADINE HCL (PATADAY) 0.2 % SOLN    Apply 1 drop to eye daily.   PANTOPRAZOLE (PROTONIX) 40 MG TABLET    Take 1 tablet (40 mg total) by mouth daily.   TIRZEPATIDE (MOUNJARO) 2.5 MG/0.5ML PEN    Inject 2.5 mg into the skin once a week.   TIRZEPATIDE (MOUNJARO) 5 MG/0.5ML PEN    Inject 5 mg into the skin once a week.   TRIAMCINOLONE OINTMENT (KENALOG) 0.1 %    Apply topically twice daily to BODY as needed for red, sandpaper like rash.  Do not use on face, groin or armpits.   VANCOMYCIN (VANCOCIN) 1-5 GM/200ML-% SOLN    Inject 200 mLs (1,000 mg total) into the vein every  Monday, Wednesday, and Friday with hemodialysis.   VANCOMYCIN IVPB    Inject 1,000 mg into the vein every Monday, Wednesday, and Friday with hemodialysis. Indication:  Culture negative IE First Dose: Yes Last Day of Therapy:  01/25/23 Labs - Sunday/Monday:  CBC/D, BMP, and vancomycin trough. Labs - Thursday:  BMP and vancomycin trough Labs - Every other week:  ESR and CRP Method of administration:Elastomeric Method of administration may be changed at the discretion of the patient and/or caregiver's ability to self-administer the medication ordered.  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[place]), nonhemorrhagic, inferior right cerebellum 12/03/2017   left side weakness in leg   Cyst of left ovary 01/12/2018   Diabetic neuropathy, painful ([place]), on low dose Gabapentin 07/13/2013   DM (diabetes mellitus), type 2, uncontrolled, with renal complications 57/84/6962   DM2 (diabetes mellitus, type 2) ([place])    Dysfunction of left eustachian tube, with pusatile tinnitus 11/24/2017   Dyslipidemia associated with type 2 diabetes mellitus ([place]) 06/25/2016   ESRD (end stage renal disease) on dialysis [facility])    On hemodialysis in July 2019 via [facility] then switched to [facility] in Nov 2019.    ESRD with anemia (H[facility])    Fibromyalgia    Gastroparesis due to DM    GERD (gastroesophageal reflux disease)    Headache    Hypertension associated with diabetes ([place]) 02/19/2011   IBS (irritable bowel syndrome)    Left-sided weakness 12/10/2017   .   Leiomyoma of uterus    Pancreatitis    PE (pulmonary thromboembolism) ([place]) 11/04/2019   Pneumonia    Proliferative diabetic retinopathy ([place])  09/29/2015   Pronation deformity of both feet 06/14/2014   Reactive depression 12/10/2017   .   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   Thromboembolism ([place]) 04/05/2018   Upper airway cough syndrome, with recs to stay off ACE and take Pepcid q hs 11/15/2016   Urticaria     Social History   Tobacco Use   Smoking status: Never    Passive exposure: Never   Smokeless tobacco: Never  Vaping Use   Vaping Use: Never used  Substance  Use Topics   Alcohol use: No    Alcohol/week: 0.0 standard drinks of alcohol   Drug use: No    Family History  Problem Relation Age of Onset   Colon polyps Mother    Diabetes Mother    Heart murmur Father        history essentially unknown   Hypertension Father    Diabetes Sister    Kidney failure Sister        kidney transplant   Diabetes Brother    Retinal degeneration Brother    Heart murmur Son    Stroke Paternal Grandmother    Diabetes Sister     Allergies  Allergen Reactions   Ibuprofen Other (See Comments)    CKD stage 3. Should avoid.   Dilaudid [Hydromorphone Hcl] Itching and Other (See Comments)    Can take with Benadryl.   Iodinated Contrast Media Rash   Tramadol Itching    Review of Systems  Constitutional: Negative.   Respiratory: Negative.    Cardiovascular: Negative.   Gastrointestinal: Negative.      OBJECTIVE:    Vitals:   01/12/23 1525  BP: 107/65  Pulse: 91  Temp: (!) 97.2 F (36.2 C)  TempSrc: Temporal  SpO2: 98%   There is no height or weight on file to calculate BMI.  Physical Exam Constitutional:      Appearance: Normal appearance.  HENT:     Head: Normocephalic and atraumatic.  Eyes:     Extraocular Movements: Extraocular movements intact.     Conjunctiva/sclera: Conjunctivae normal.  Pulmonary:     Effort: Pulmonary effort is normal. No respiratory distress.  Abdominal:      General: There is no distension.     Palpations: Abdomen is soft.  Musculoskeletal:     Cervical back: Normal range of motion and neck supple.     Comments: Right BKA  Skin:    General: Skin is warm and dry.  Neurological:     General: No focal deficit present.     Mental Status: [name] alert and oriented to person, place, and time. Mental status is at baseline.  Psychiatric:        Mood and Affect: Mood normal.        Behavior: Behavior normal.      Labs and Microbiology:    Latest Ref Rng & Units 12/24/2022    8:23 AM 12/22/2022    1:19 AM 12/19/2022    2:57 AM  CBC  WBC 4.0 - 10.5 K/uL 6.3  6.8  6.9   Hemoglobin 12.0 - 15.0 g/dL 8.3  9.2  9.4   Hematocrit 36.0 - 46.0 % 26.2  30.0  30.3   Platelets 150 - 400 K/uL 164  169  167       Latest Ref Rng & Units 12/24/2022    3:26 AM 12/23/2022    2:02 AM 12/22/2022    1:19 AM  CMP  Glucose 70 - 99 mg/dL 136  139  163   BUN 6 - 20 mg/dL 46  34  56   Creatinine 0.44 - 1.00 mg/dL 9.04  7.22  11.50   Sodium 135 - 145 mmol/L 135  132  135   Potassium 3.5 - 5.1 mmol/L 4.0  3.6  4.0   Chloride 98 - 111 mmol/L 97  98  99   CO2 22 - 32 mmol/L '21  21  24   '$ Calcium 8.9 - 10.3 mg/dL 9.4  9.0  9.6  ASSESSMENT & PLAN:    Mitral valve mass Unclear if this truly has represented infective endocarditis as would expect a vegetation to have some resorption following initial prolonged antibiotic therapy like she received.  Additionally, all blood cultures have been negative and she has not had any infectious symptoms to really speak of.  Nonetheless, she is completing her second course of antibiotics for 6 weeks which will conclude on 01/25/23.    Further workup for atypical ID etiologies has also been unrevealing as well as workup for nonbacterial thrombotic endocarditis.  She was seen by CT surgery during her recent admit whom discussed her options and plan was for her to be evaluated at [facility] for possible Angiovac procedure.    Will  continue cefepime and vancomycin as planned with HD through 01/25/23.  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  Will refer to CT surgery today to determine if they need to see her in clinic or whether they will refer directly to [facility] from prior notes.  Will also repeat her TTE and follow up in 4 weeks.    Orders Placed This Encounter  Procedures   Ambulatory referral to Cardiothoracic Surgery    Referral Priority:   Routine    Referral Type:   Surgical    Referral Reason:   Specialty Services Required    Requested Specialty:   Cardiothoracic Surgery    Number of Visits Requested:   1   ECHOCARDIOGRAM COMPLETE    Standing Status:   Future    Standing Expiration Date:   01/13/2024    Order Specific Question:   Where should this test be performed    Answer:   [facility]    Order Specific Question:   Perflutren DEFINITY (image enhancing agent) should be administered unless hypersensitivity or allergy exist    Answer:   Administer Perflutren    Order Specific Question:   Is a special reader required? (athlete or structural heart)    Answer:   No    Order Specific Question:   Reason for exam-Echo    Answer:   Endocarditis [facility] 01/12/2023, 3:53 PM

## 2023-01-12 NOTE — Progress Notes (Signed)
Called [place], spoke with [employer]. Requested patient's labs be faxed to triage.   Beryle Flock, RN

## 2023-01-24 ENCOUNTER — Emergency Department (HOSPITAL_BASED_OUTPATIENT_CLINIC_OR_DEPARTMENT_OTHER)
Admission: EM | Admit: 2023-01-24 | Discharge: 2023-01-24 | Disposition: A | Discharge status: Home / Self Care | Payer: Medicare Other | Attending: Emergency Medicine | Admitting: Emergency Medicine

## 2023-01-24 ENCOUNTER — Encounter (HOSPITAL_BASED_OUTPATIENT_CLINIC_OR_DEPARTMENT_OTHER): Payer: Self-pay

## 2023-01-24 ENCOUNTER — Other Ambulatory Visit: Payer: Self-pay

## 2023-01-24 ENCOUNTER — Emergency Department (HOSPITAL_BASED_OUTPATIENT_CLINIC_OR_DEPARTMENT_OTHER): Payer: Medicare Other

## 2023-01-24 DIAGNOSIS — H539 Unspecified visual disturbance: Secondary | ICD-10-CM

## 2023-01-24 DIAGNOSIS — H538 Other visual disturbances: Secondary | ICD-10-CM | POA: Diagnosis not present

## 2023-01-24 DIAGNOSIS — R519 Headache, unspecified: Secondary | ICD-10-CM

## 2023-01-24 MED ORDER — DIPHENHYDRAMINE HCL 50 MG/ML IJ SOLN
50.0000 mg | Freq: Once | INTRAMUSCULAR | Status: AC
  Administered 2023-01-24: 50 mg via INTRAVENOUS
  Filled 2023-01-24: qty 1

## 2023-01-24 MED ORDER — SODIUM CHLORIDE 0.9 % IV BOLUS
500.0000 mL | Freq: Once | INTRAVENOUS | Status: AC
  Administered 2023-01-24: 500 mL via INTRAVENOUS

## 2023-01-24 MED ORDER — PROCHLORPERAZINE EDISYLATE 10 MG/2ML IJ SOLN
10.0000 mg | Freq: Once | INTRAMUSCULAR | Status: AC
  Administered 2023-01-24: 10 mg via INTRAVENOUS
  Filled 2023-01-24: qty 2

## 2023-01-24 NOTE — ED Notes (Signed)
Discharge paperwork reviewed entirely with patient, including Rx's and follow up care. Pain was under control. Pt verbalized understanding as well as all parties involved. No questions or concerns voiced at the time of discharge. No acute distress noted.   Pt ambulated out to PVA without incident or assistance.  

## 2023-01-24 NOTE — ED Notes (Signed)
Patient transported to CT 

## 2023-01-24 NOTE — Discharge Instructions (Signed)
Your history, exam, evaluation today did not show evidence of acute stroke on the CT scan and just showed evidence of your old strokes.  Initially our plan was to get labs and if your symptoms do not improve, discussed transfer for MRI tonight as recommended by neurology however given the complete resolution of your vision changes and headache after the headache medicine, we feel you are safe for discharge home.  Please follow-up with your outpatient neurology team and PCP to continue your management.  If any symptoms change or worsen, please return to the nearest emergency department.

## 2023-01-24 NOTE — ED Notes (Signed)
Will allow fluids to be administered and then will attempt blood draw after 521m of NS.

## 2023-01-24 NOTE — ED Triage Notes (Signed)
In for eval of headache behind left eye onset Friday. Nausea and blurred vision. H/O migraines but it has been so ling since she had one, she cannot remember what they feel like when asked.

## 2023-01-24 NOTE — ED Notes (Signed)
ED Provider at bedside. 

## 2023-01-24 NOTE — ED Notes (Signed)
Labs not obtained due to pt being a hard stick. EDP aware

## 2023-01-24 NOTE — ED Provider Notes (Signed)
[place]MENT AT [facility] [place] Provider Note   CSN: [id number] Arrival date & time: 01/24/23  1259     History  Chief Complaint  Patient presents with   Headache    MEKHI LASCOLA is a 52 y.o. female.  The history is provided by the patient and medical records. No language interpreter was used.  Headache Pain location:  L temporal Quality:  Dull Severity currently:  10/10 Severity at highest:  9/10 Onset quality:  Gradual Duration:  1 week Timing:  Constant Chronicity:  New Similar to prior headaches: no   Context: bright light and loud noise   Context: not activity   Relieved by:  Nothing Worsened by:  Light Associated symptoms: blurred vision, nausea, photophobia and visual change   Associated symptoms: no abdominal pain, no back pain, no congestion, no cough, no diarrhea, no eye pain, no facial pain, no fatigue, no fever, no focal weakness, no near-syncope, no neck pain, no neck stiffness, no numbness, no paresthesias, no seizures, no syncope, no tingling, no URI, no vomiting and no weakness        Home Medications Prior to Admission medications   Medication Sig Start Date End Date Taking? Authorizing Provider  aspirin 81 MG chewable tablet Chew 1 tablet (81 mg total) by mouth daily. 10/24/22   Thurnell Lose, MD  atorvastatin (LIPITOR) 80 MG tablet Take 1 tablet (80 mg total) by mouth at bedtime. 12/24/22   Little Ishikawa, MD  calcium acetate (PHOSLO) 667 MG capsule Take 2,001 mg by mouth 3 (three) times daily with meals. 08/25/21   [provider]  ceFEPime (MAXIPIME) IVPB Inject 2 g into the vein every Monday, Wednesday, and Friday with hemodialysis. Indication:  Culture negative IE First Dose: Yes Last Day of Therapy:  01/25/23 Labs - Once weekly:  CBC/D and BMP, Labs - Every other week:  ESR and CRP Method of administration: IV Push Method of administration may be changed at the discretion of home infusion  pharmacist based upon assessment of the patient and/or caregiver's ability to self-administer the medication ordered. 12/24/22 01/25/23  Mignon Pine, DO  Continuous Blood Gluc Sensor (FREESTYLE LIBRE 14 DAY SENSOR) MISC 1 patch by Does not apply route every 14 (fourteen) days. 02/25/22   [name], MD  cromolyn (OPTICROM) 4 % ophthalmic solution Place 1 drop into both eyes 4 (four) times daily as needed. Patient taking differently: Place 1 drop into both eyes 4 (four) times daily as needed (for allergies). 12/08/22   Clemon Chambers, MD  fluticasone (FLONASE) 50 MCG/ACT nasal spray Place 2 sprays into both nostrils daily. 12/01/22   Clemon Chambers, MD  gabapentin (NEURONTIN) 100 MG capsule Take 1 capsule (100 mg total) by mouth 3 (three) times a week. With Dialysis 01/05/23   [name], MD  insulin aspart (NOVOLOG FLEXPEN) 100 UNIT/ML FlexPen Inject 6 Units into the skin 3 (three) times daily with meals. Sliding scale Patient taking differently: Inject 6 Units into the skin daily as needed for high blood sugar. Sliding scale 02/25/22   [name], MD  insulin degludec (TRESIBA FLEXTOUCH) 100 UNIT/ML FlexTouch Pen Inject 26 Units into the skin daily.    [provider]  Insulin Pen Needle 29G X 5MM MISC 1 Device by Does not apply route 4 (four) times daily. 12/26/19   Shamleffer, Melanie Crazier, MD  ketoconazole (NIZORAL) 2 % cream Apply 1 Application topically daily as needed for irritation. 09/22/22  [provider]  loratadine (CLARITIN) 10 MG tablet Take 10 mg by mouth daily. 09/30/22   [provider]  Olopatadine HCl (PATADAY) 0.2 % SOLN Apply 1 drop to eye daily. Patient taking differently: Place 1 drop into both eyes daily as needed (dry eyes). 10/12/22   [name], MD  pantoprazole (PROTONIX) 40 MG tablet Take 1 tablet (40 mg total) by mouth daily. 12/01/22   Clemon Chambers, MD  tirzepatide [facility]) 2.5 MG/0.5ML Pen Inject 2.5 mg into the skin  once a week. Patient not taking: Reported on 01/12/2023 01/04/23   [name], MD  tirzepatide [facility]) 5 MG/0.5ML Pen Inject 5 mg into the skin once a week. Patient not taking: Reported on 01/12/2023 01/04/23   [name], MD  triamcinolone ointment (KENALOG) 0.1 % Apply topically twice daily to BODY as needed for red, sandpaper like rash.  Do not use on face, groin or armpits. Patient taking differently: Apply 1 Application topically daily as needed (For rash). 12/01/22   Clemon Chambers, MD  vancomycin [facility]) 1-5 GM/200ML-% SOLN Inject 200 mLs (1,000 mg total) into the vein every Monday, Wednesday, and Friday with hemodialysis. 10/06/22   Ghimire, Henreitta Leber, MD  vancomycin IVPB Inject 1,000 mg into the vein every Monday, Wednesday, and Friday with hemodialysis. Indication:  Culture negative IE First Dose: Yes Last Day of Therapy:  01/25/23 Labs - Sunday/Monday:  CBC/D, BMP, and vancomycin trough. Labs - Thursday:  BMP and vancomycin trough Labs - Every other week:  ESR and CRP Method of administration:Elastomeric Method of administration may be changed at the discretion of the patient and/or caregiver's ability to self-administer the medication ordered. 12/24/22 01/25/23  Mignon Pine, DO      Allergies    Ibuprofen, Dilaudid [hydromorphone hcl], Iodinated contrast media, and Tramadol    Review of Systems   Review of Systems  Constitutional:  Negative for chills, fatigue and fever.  HENT:  Negative for congestion.   Eyes:  Positive for blurred vision and photophobia. Negative for pain.  Respiratory:  Negative for cough, chest tightness, shortness of breath and wheezing.   Cardiovascular:  Negative for syncope and near-syncope.  Gastrointestinal:  Positive for nausea. Negative for abdominal pain, constipation, diarrhea and vomiting.  Genitourinary:  Negative for dysuria.  Musculoskeletal:  Negative for back pain, neck pain and neck stiffness.  Skin:  Negative for rash and  wound.  Neurological:  Positive for headaches. Negative for focal weakness, seizures, speech difficulty, weakness, light-headedness, numbness and paresthesias.  Psychiatric/Behavioral:  Negative for agitation and confusion.   All other systems reviewed and are negative.   Physical Exam Updated Vital Signs BP 124/75   Pulse 95   Temp 98.2 F (36.8 C) (Oral)   Resp 17   Ht '5\' 9"'$  (1.753 m)   Wt 103 kg   LMP 09/10/2021   SpO2 92%   BMI 33.52 kg/m  Physical Exam Vitals and nursing note reviewed.  Constitutional:      General: [name] not in acute distress.    Appearance: [name] well-developed. [name] not ill-appearing, toxic-appearing or diaphoretic.  HENT:     Head: Normocephalic and atraumatic.     Nose: No congestion or rhinorrhea.     Mouth/Throat:     Mouth: Mucous membranes are moist.     Pharynx: No oropharyngeal exudate or posterior oropharyngeal erythema.  Eyes:     Extraocular Movements: Extraocular movements intact.     Conjunctiva/sclera: Conjunctivae normal.  Pupils: Pupils are equal, round, and reactive to light.  Cardiovascular:     Rate and Rhythm: Normal rate and regular rhythm.     Pulses: Normal pulses.     Heart sounds: Murmur heard.  Pulmonary:     Effort: Pulmonary effort is normal. No respiratory distress.     Breath sounds: Normal breath sounds.  Abdominal:     Palpations: Abdomen is soft.     Tenderness: There is no abdominal tenderness. There is no guarding or rebound.  Musculoskeletal:        General: No swelling or tenderness.     Cervical back: Neck supple. No tenderness.     Left lower leg: No edema.  Skin:    General: Skin is warm and dry.     Capillary Refill: Capillary refill takes less than 2 seconds.     Coloration: Skin is not pale.     Findings: No erythema or rash.  Neurological:     Mental Status: [name] alert.     Sensory: No sensory deficit.     Motor: No weakness.  Psychiatric:        Mood and Affect: Mood normal.      ED Results / Procedures / Treatments   Labs (all labs ordered are listed, but only abnormal results are displayed) Labs Reviewed  CULTURE, BLOOD (ROUTINE X 2)  CULTURE, BLOOD (ROUTINE X 2)  CBC WITH DIFFERENTIAL/PLATELET  COMPREHENSIVE METABOLIC PANEL  LACTIC ACID, PLASMA  LACTIC ACID, PLASMA    EKG None  Radiology CT Head Wo Contrast  Result Date: 01/24/2023 CLINICAL DATA:  Neuro deficit, acute, stroke suspected EXAM: CT HEAD WITHOUT CONTRAST TECHNIQUE: Contiguous axial images were obtained from the base of the skull through the vertex without intravenous contrast. RADIATION DOSE REDUCTION: This exam was performed according to the departmental dose-optimization program which includes automated exposure control, adjustment of the mA and/or kV according to patient size and/or use of iterative reconstruction technique. COMPARISON:  Head CT 12/17/2022, brain MRI 12/12/2022 FINDINGS: Brain: No acute intracranial hemorrhage. There is no evidence of acute ischemia. Remote infarcts in both cerebellum. The previous small parasagittal left frontal parietal infarct that was acute on recent imaging has minimal encephalomalacia. Background atrophy and chronic small vessel ischemic changes are stable. No midline shift, mass effect, hydrocephalus or extra-axial collection. Vascular: Atherosclerosis of skullbase vasculature without hyperdense vessel or abnormal calcification. Skull: No fracture or focal lesion. Sinuses/Orbits: Stable exophthalmos. No acute findings. Other: None. IMPRESSION: 1. No acute intracranial abnormality. 2. Remote infarcts in both cerebellum. Minimal encephalomalacia at site of previous small parasagittal left frontal parietal infarct that was acute on recent imaging. 3. Stable atrophy and chronic small vessel ischemic changes. Electronically Signed   By: Keith Rake M.D.   On: 01/24/2023 17:55    Procedures Procedures    Medications Ordered in ED Medications   prochlorperazine (COMPAZINE) injection 10 mg (10 mg Intravenous Given 01/24/23 1830)  diphenhydrAMINE (BENADRYL) injection 50 mg (50 mg Intravenous Given 01/24/23 1831)  sodium chloride 0.9 % bolus 500 mL (500 mLs Intravenous New Bag/Given 01/24/23 1838)    ED Course/ Medical Decision Making/ A&P                             Medical Decision Making Amount and/or Complexity of Data Reviewed Labs: ordered. Radiology: ordered.  Risk Prescription drug management.    ZANDRIA WOLDT is a 52 y.o. female with a complex past  medical history including diabetes, hypertension ESRD on dialysis MWF, previous pulmonary embolism, fibromyalgia, previous pancreatitis, previous stroke, right BKA, and known cardiac mass with subsequent stroke and just completed antibiotics for endocarditis who presents with 1 week of left-sided headache behind her left eye and 4 days of left blurry vision.  According to patient, she has a history of headaches and migraines in the past but is unsure if this feels like what she has had before.  She says that for the last week or so she has been having headaches developing in her left head behind her left eye that feels different than normal.  She says that for the last 4 days she has developed blurriness in her left vision that is not focal or in 1 area but is a diffuse blurriness like a film.  She denies any external eye pain or eye tenderness.  She denies any numbness, tingling, or weakness of extremities.  Denies speech difficulties.  Denies nausea, vomiting, constipation, diarrhea.  Denies any cough, congestion, chest pain, shortness of breath.  Denies neck pain or neck stiffness.  Denies trauma.  She reports she is been having headaches that have worsened during her dialysis treatment she reports.  Otherwise denies fevers or chills.  She reports she took her last dose of antibiotics today with her dialysis and she is scheduled to see [facility] for further management of this cardiac  mass.  On exam, lungs clear and chest nontender.  Abdomen nontender.  She does have a slight murmur.  Patient has intact pulses in upper extremities and left lower extremity.  She has a right BKA.  Pupils are symmetric and reactive with normal extract movements.  Did not produce more pain with eye movement.  She does have some photophobia.  No tenderness around the orbit and no evidence of external orbital cellulitis with no erythema.  No crepitance.  No tenderness of the temporal area to suggest temporal arteritis.  No neck tenderness.  She had normal red vision in both eyes without red desaturation to suggest optic neuritis.  She denies personal or family history of MS.  I had a discussion with the patient outlining a possible plan of care.  Given the photophobia and phonophobia with these headaches and vision changes this could be a migrainous type headache however given her history of the cardiac mass, possible cardiac infection, and previous strokes I am concerned that there could be a cardioembolic etiology of her symptoms.    I called and spoke with Dr. Erlinda Hong with neurology to discuss the headache and vision changes in the setting of her history and he does feel that a noncontrasted head CT is reasonable start given her contrast allergy and her recent strokes to rule out hemorrhagic conversion.  He then said that if she is developing headache and symptoms, she would likely needs ED to ED transfer for MRI tonight and will get MRI brain and orbits without contrast as well as MRV without contrast.  Patient agrees with this plan.  Will give headache cocktail and reassess after basic labs and head CT.  8:00 PM Patient reassessed and she has complete resolution of her headache and vision abnormalities have resolved as well.  I suspect complex migraine.  We had a challenging time getting blood work from her but I was able to place an ultrasound-guided IV.  She got the headache cocktail that helped her  symptoms.  We offered ED to ED transfer as neurology recommended however she does not want  this.  She will follow-up with her outpatient neurologist.  She also does not want the blood work now that her CT was reassuring and symptoms have resolved.  Patient agrees with plan of care and will be discharged for outpatient follow-up.         Final Clinical Impression(s) / ED Diagnoses Final diagnoses:  Acute nonintractable headache, unspecified headache type  Transient vision disturbance of left eye    Rx / DC Orders ED Discharge Orders     None      Clinical Impression: 1. Acute nonintractable headache, unspecified headache type   2. Transient vision disturbance of left eye     Disposition: Discharge  Condition: Good  I have discussed the results, Dx and Tx plan with the pt(& family if present). He/she/they expressed understanding and agree(s) with the plan. Discharge instructions discussed at great length. Strict return precautions discussed and pt &/or family have verbalized understanding of the instructions. No further questions at time of discharge.    New Prescriptions   No medications on file    Follow Up: [name], [address] [place] [address] [phone]     your neurologist     [facility] Emergency Department at [facility] [address] [id number] m[place] [phone]       Harsimran Westman, Gwenyth Allegra, MD 01/24/23 2004

## 2023-01-24 NOTE — ED Notes (Signed)
Attempted x 3 with ultrasound to get labs on pt. Unable to get return. EDP aware

## 2023-01-25 ENCOUNTER — Other Ambulatory Visit: Payer: Self-pay | Admitting: Internal Medicine

## 2023-01-25 DIAGNOSIS — I058 Other rheumatic mitral valve diseases: Secondary | ICD-10-CM

## 2023-01-28 ENCOUNTER — Encounter: Payer: Medicare Other | Admitting: Thoracic Surgery (Cardiothoracic Vascular Surgery)

## 2023-02-04 ENCOUNTER — Ambulatory Visit (INDEPENDENT_AMBULATORY_CARE_PROVIDER_SITE_OTHER): Payer: Medicare Other | Admitting: Family Medicine

## 2023-02-04 ENCOUNTER — Encounter: Payer: Self-pay | Admitting: Family Medicine

## 2023-02-04 VITALS — BP 173/95 | HR 84 | Temp 97.8°F | Ht 69.0 in

## 2023-02-04 DIAGNOSIS — J029 Acute pharyngitis, unspecified: Secondary | ICD-10-CM | POA: Diagnosis not present

## 2023-02-04 DIAGNOSIS — G43909 Migraine, unspecified, not intractable, without status migrainosus: Secondary | ICD-10-CM

## 2023-02-04 DIAGNOSIS — I152 Hypertension secondary to endocrine disorders: Secondary | ICD-10-CM

## 2023-02-04 DIAGNOSIS — E1159 Type 2 diabetes mellitus with other circulatory complications: Secondary | ICD-10-CM | POA: Diagnosis not present

## 2023-02-04 DIAGNOSIS — R059 Cough, unspecified: Secondary | ICD-10-CM

## 2023-02-04 DIAGNOSIS — E119 Type 2 diabetes mellitus without complications: Secondary | ICD-10-CM

## 2023-02-04 DIAGNOSIS — Z794 Long term (current) use of insulin: Secondary | ICD-10-CM

## 2023-02-04 LAB — POCT RAPID STREP A (OFFICE): Rapid Strep A Screen: POSITIVE — AB

## 2023-02-04 LAB — POC COVID19 BINAXNOW: SARS Coronavirus 2 Ag: NEGATIVE

## 2023-02-04 MED ORDER — PREDNISONE 50 MG PO TABS: ORAL_TABLET | ORAL | 0 refills | Status: DC

## 2023-02-04 MED ORDER — PROMETHAZINE-DM 6.25-15 MG/5ML PO SYRP: 5.0000 mL | ORAL_SOLUTION | Freq: Four times a day (QID) | ORAL | 0 refills | Status: DC | PRN

## 2023-02-04 MED ORDER — AMOXICILLIN 500 MG PO CAPS: 500.0000 mg | ORAL_CAPSULE | Freq: Two times a day (BID) | ORAL | 0 refills | Status: AC

## 2023-02-04 MED ORDER — AZELASTINE HCL 0.1 % NA SOLN: 2.0000 | Freq: Two times a day (BID) | NASAL | 12 refills | Status: DC

## 2023-02-04 NOTE — Assessment & Plan Note (Signed)
Blood pressure today though usually well-controlled with dialysis.  Due to her labile blood pressures and symptomatic lows in the past we will not add any other additional antihypertensives at this point.

## 2023-02-04 NOTE — Progress Notes (Signed)
Heather [name] is a 52 y.o. female who presents today for an office visit.  Assessment/Plan:  New/Acute Problems: Cough / Sore Throat Rapid strep positive.  May have some underlying sinusitis as well.  Will send in renally dosed amoxicillin 500 mg twice daily for 7 days.  Will also start Astelin nasal spray.  Will send in promethazine-dextromethorphan cough syrup.  No manufacturer recommendation for renal dose adjustment with this.  Encouraged hydration.  She will let me know if not improving.  Hand Paresthesias Positive Tinel sign on exam consistent with predominantly positive with medial epicondyle.  She does use a wheelchair and this is likely contributing to her symptoms as well.  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  We did discuss referral to sports medicine however she deferred.  We will start prednisone burst.  She has done well with this in the past.  Hopefully this will help some with her above cough and sore throat as well.  We did discuss potential for increasing her blood sugar she will continue to monitor.  She will let me know if she has any significant elevation in sugars over the next several days.  If symptoms persist will need referral to sports medicine at that time.  Chronic Problems Addressed Today: Diabetes mellitus, type II, insulin dependent ([place]), on CGM, with end organ damage: renal, neuropathy, gastroparesis, retinopathy Too early to recheck A1c today.  Insurance would not pay for [employer].  We will try to see if we can get a coupon card for coverage.  If not we will continue current regimen of Tresiba 26 units daily with NovoLog 6 units 3 times daily with meals as needed.  She will come back in a couple months and we can recheck A1c at that time.  Hypertension associated with diabetes ([place]) Blood pressure today though usually well-controlled with dialysis.  Due to her labile blood pressures and symptomatic lows in  the past we will not add any other additional antihypertensives at this point.  Migraine Still has ongoing use with low-grade daily headache.  This could be due to her above URI did have a severe migraine recently that resulted in her going into the emergency room.  She does have a history of stroke and needs neurology follow-up regardless.  She has not followed up since her stroke a couple months ago.  Will place referral today.  We discussed reasons to return to care.     Subjective:  HPI:  Patient here for follow-up.  She went to the ED a little over a week ago with headache.  CT was performed in the ED which was reassuring.  She was given migraine cocktail with resolution of her symptoms.  Over the last week or so she still has ongoing issues with low-grade headaches however this seems to be much better than what it was before.  Tylenol does not seem to be effective.  She has had issues with headache in the past and migraines in the past however it has been several years since her last flare.  She has not seen a neurologist for quite a while.  She has also been having ongoing issues with cough and sore throat.  This has been going on for several weeks at this point.  Pain with swallowing.  A lot of congestion.  A lot of cough and sneeze as well.  No reported fevers or chills.  No shortness of breath.  She has also  been having some ongoing issues with numbness and tingling in bilateral hands and fingers.  Right is worse than left.  This is been going on for couple months.  No neck pain.  No obvious aggravating or alleviating factors.  Normal strength.  Worse with certain motions and activities.  We will last saw her several weeks ago.  We tried adding on Mounjaro to her diabetes regimen however insurance would not pay for this.  [name] not interested in trying to get a coupon savings card.  Her sugars have been well-controlled in the 100s on current regimen of tresiba 26 units daily and NovoLog 6  units 3 times daily.       Objective:  Physical Exam: BP (!) 173/95   Pulse 84   Temp 97.8 F (36.6 C) (Temporal)   Ht 5' 9"$  (1.753 m)   LMP 09/10/2021   SpO2 96%   BMI 33.52 kg/m   Gen: No acute distress, resting comfortably HEENT: OP erythematous. CV: Regular rate and rhythm with no murmurs appreciated Pulm: Normal work of breathing, clear to auscultation bilaterally with no crackles, wheezes, or rhonchi MSK: - Arms: Neurovascular intact distally.  Positive Tinel sign at bilateral wrist and medial malleoli. Neuro: Grossly normal.  In wheelchair.  Moves all extremities. Psych: Normal affect and thought content  Time Spent: 45 minutes of total time was spent on the date of the encounter performing the following actions: chart review prior to seeing the patient including recent ED visit, obtaining history, performing a medically necessary exam, counseling on the treatment plan, placing orders, and documenting in our EHR.        [name]. Jerline Pain, MD 02/04/2023 10:08 AM

## 2023-02-04 NOTE — Assessment & Plan Note (Signed)
Too early to recheck A1c today.  Insurance would not pay for [employer].  We will try to see if we can get a coupon card for coverage.  If not we will continue current regimen of Tresiba 26 units daily with NovoLog 6 units 3 times daily with meals as needed.  She will come back in a couple months and we can recheck A1c at that time.

## 2023-02-04 NOTE — Assessment & Plan Note (Signed)
Still has ongoing use with low-grade daily headache.  This could be due to her above URI did have a severe migraine recently that resulted in her going into the emergency room.  She does have a history of stroke and needs neurology follow-up regardless.  She has not followed up since her stroke a couple months ago.  Will place referral today.  We discussed reasons to return to care.

## 2023-02-04 NOTE — Patient Instructions (Addendum)
It was very nice to see you today!  You have strep throat. This could be cause some of your symptoms.  We will start the amoxicillin.  Please also start the prednisone.  Please try the nasal spray for your congestion.  I will also send in a cough medication.  You have a pinched nerve at your elbows.  This is causing your hand numbness and tingling.  Please start the prednisone.  Please try to avoid placing pressure on your elbows.  Let me know if not improving.  I will refer you to see the neurologist for your migraines.  Please try the coupon code to see if this will help with the [facility].  We will see back in a couple of months.  Come back sooner if needed.  Take care, Dr Jerline Pain  PLEASE NOTE:  If you had any lab tests, please let us know if you have not heard back within a few days. You may see your results on mychart before we have a chance to review them but we will give you a call once they are reviewed by [name].   If we ordered any referrals today, please let us know if you have not heard from their office within the next week.   If you had any urgent prescriptions sent in today, please check with the pharmacy within an hour of our visit to make sure the prescription was transmitted appropriately.   Please try these tips to maintain a healthy lifestyle:  Eat at least 3 REAL meals and 1-2 snacks per day.  Aim for no more than 5 hours between eating.  If you eat breakfast, please do so within one hour of getting up.   Each meal should contain half fruits/vegetables, one quarter protein, and one quarter carbs (no bigger than a computer mouse)  Cut down on sweet beverages. This includes juice, soda, and sweet tea.   Drink at least 1 glass of water with each meal and aim for at least 8 glasses per day  Exercise at least 150 minutes every week.

## 2023-02-07 ENCOUNTER — Ambulatory Visit (HOSPITAL_COMMUNITY): Payer: Medicare Other

## 2023-02-09 ENCOUNTER — Other Ambulatory Visit: Payer: Self-pay

## 2023-02-09 ENCOUNTER — Ambulatory Visit (INDEPENDENT_AMBULATORY_CARE_PROVIDER_SITE_OTHER): Payer: Medicare Other | Admitting: Internal Medicine

## 2023-02-09 ENCOUNTER — Encounter: Payer: Self-pay | Admitting: Internal Medicine

## 2023-02-09 VITALS — BP 175/93 | HR 84 | Temp 97.8°F

## 2023-02-09 DIAGNOSIS — I058 Other rheumatic mitral valve diseases: Secondary | ICD-10-CM | POA: Diagnosis present

## 2023-02-09 NOTE — Assessment & Plan Note (Signed)
Patient has completed 2 courses of prolonged IV antibiotics x 6 weeks each.  All cultures and serologies for typical and atypical infectious etiologies have been negative.  It is unclear if this has truly represented infective endocarditis as would expect a vegetation to have some resorption following antibiotic therapy.  She completed her second course of IV therapy on January 25, 2023.  She has a repeat TTE scheduled for later this month and will try to arrange follow-up with [facility] with referral that was placed a couple weeks ago.  At this time we will have her follow-up with ID as needed.

## 2023-02-09 NOTE — Progress Notes (Signed)
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   Patient is here today for planned 4-week follow-up.  She was admitted at [facility] from 12/12/22-12/24/22 due to the above.   [name] status post 6 weeks of vancomycin and cefepime due to concern for mitral valve endocarditis that was previously completed in mid-November 2023.  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  Follow-up MRI this most recent admission in December showed new infarcts from prior as well.     TEE was done 12/16/2022 which showed similar appearance of mitral valve mass from her prior TEE in October.  She was seen by Dr Kipp Brood during her admission who discussed options for her.  They were interested in Reeves procedure which would need to be done through [place] given left sided involvement.   She was discharged to SNF on vancomycin and cefepime with HD through 01/25/23 with plan for CT surgery evaluation as an outpatient.  She was seen by myself on 01/12/2023 and had since been discharged from her SNF and was back home.  She felt well at that time.  She continues to have no fevers or chills.  She completed her antibiotic therapy at HD on 01/25/2023.  She was referred to Dr. Kipp Brood at our previous visit.  They declined to see her in consultation and recommended that she be referred to [facility] for CT surgery evaluation.  This referral was placed on 01/25/2023 to Dr. [name].  Today she reports doing okay.  [name] with her husband.  She has been having some occasional dizziness.   Please see A&P for the details of today's visit and status of the patient's medical problems.   Patient's Medications  New Prescriptions   No medications on file  Previous Medications    AMOXICILLIN (AMOXIL) 500 MG CAPSULE    Take 1 capsule (500 mg total) by mouth 2 (two) times daily for 7 days.   ASPIRIN 81 MG CHEWABLE TABLET    Chew 1 tablet (81 mg total) by mouth daily.   ATORVASTATIN (LIPITOR) 80 MG TABLET    Take 1 tablet (80 mg total) by mouth at bedtime.   AZELASTINE (ASTELIN) 0.1 % NASAL SPRAY    Place 2 sprays into both nostrils 2 (two) times daily.   CALCIUM ACETATE (PHOSLO) 667 MG CAPSULE    Take 2,001 mg by mouth 3 (three) times daily with meals.   CONTINUOUS BLOOD GLUC SENSOR (FREESTYLE LIBRE 14 DAY SENSOR) MISC    1 patch by Does not apply route every 14 (fourteen) days.   CROMOLYN (OPTICROM) 4 % OPHTHALMIC SOLUTION    Place 1 drop into both eyes 4 (four) times daily as needed.   DOXERCALCIFEROL (HECTOROL IV)    Doxercalciferol (Hectorol)   FLUTICASONE (FLONASE) 50 MCG/ACT NASAL SPRAY    Place 2 sprays into both nostrils daily.   GABAPENTIN (NEURONTIN) 100 MG CAPSULE    Take 1 capsule (100 mg total) by mouth 3 (three) times a week. With Dialysis   INSULIN ASPART (NOVOLOG FLEXPEN) 100 UNIT/ML FLEXPEN    Inject 6 Units into the skin 3 (three) times daily with meals. Sliding scale   INSULIN DEGLUDEC (TRESIBA FLEXTOUCH) 100 UNIT/ML FLEXTOUCH PEN  Inject 26 Units into the skin daily.   INSULIN PEN NEEDLE 29G X 5MM MISC    1 Device by Does not apply route 4 (four) times daily.   KETOCONAZOLE (NIZORAL) 2 % CREAM    Apply 1 Application topically daily as needed for irritation.   LORATADINE (CLARITIN) 10 MG TABLET    Take 10 mg by mouth daily.   METHOXY PEG-EPOETIN BETA (MIRCERA IJ)    Mircera   MIDODRINE (PROAMATINE) 5 MG TABLET    Take by mouth.   OLOPATADINE HCL (PATADAY) 0.2 % SOLN    Apply 1 drop to eye daily.   PANTOPRAZOLE (PROTONIX) 40 MG TABLET    Take 1 tablet (40 mg total) by mouth daily.   PREDNISONE (DELTASONE) 50 MG TABLET    Take 1 tablet daily for 5 days.   PROMETHAZINE-DEXTROMETHORPHAN (PROMETHAZINE-DM) 6.25-15 MG/5ML SYRUP    Take 5 mLs by mouth 4  (four) times daily as needed.   TRIAMCINOLONE OINTMENT (KENALOG) 0.1 %    Apply topically twice daily to BODY as needed for red, sandpaper like rash.  Do not use on face, groin or armpits.   VANCOMYCIN (VANCOCIN) 1-5 GM/200ML-% SOLN    Inject 200 mLs (1,000 mg total) into the vein every Monday, Wednesday, and Friday with hemodialysis.  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[place]), nonhemorrhagic, inferior right cerebellum 12/03/2017   left side weakness in leg   Cyst of left ovary 01/12/2018   Diabetic neuropathy, painful ([place]), on low dose Gabapentin 07/13/2013   DM (diabetes mellitus), type 2, uncontrolled, with renal complications [id number]   DM2 (diabetes mellitus, type 2) ([place])    Dysfunction of left eustachian tube, with pusatile tinnitus 11/24/2017   Dyslipidemia associated with type 2 diabetes mellitus ([place]) 06/25/2016   ESRD (end stage renal disease) on dialysis [facility])    On hemodialysis in July 2019 via [id number] then switched to [facility] in Nov 2019.    ESRD with anemia ([facility])    Fibromyalgia    Gastroparesis due to DM    GERD (gastroesophageal reflux disease)    Headache    Hypertension associated with diabetes ([place]) 02/19/2011   IBS (irritable bowel syndrome)    Left-sided weakness 12/10/2017   .   Leiomyoma of uterus    Pancreatitis    PE (pulmonary thromboembolism) ([place]) 11/04/2019   Pneumonia    Proliferative diabetic retinopathy ([place]) 09/29/2015   Pronation deformity of both feet 06/14/2014   Reactive depression 12/10/2017   .   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   Thromboembolism ([place]) 04/05/2018   Upper airway cough syndrome, with recs to stay off ACE and take Pepcid q hs 11/15/2016   Urticaria     Social History   Tobacco Use   Smoking status: Never    Passive exposure: Never   Smokeless tobacco: Never  Vaping Use   Vaping Use: Never used  Substance Use Topics   Alcohol use: No    Alcohol/week: 0.0 standard drinks of alcohol   Drug use: No    Family History  Problem Relation Age of Onset   Colon polyps Mother    Diabetes Mother    Heart murmur Father        history essentially unknown   Hypertension Father    Diabetes Sister    Kidney failure Sister        kidney transplant   Diabetes Brother    Retinal degeneration Brother    Heart murmur Son    Stroke Paternal Grandmother    Diabetes Sister     Allergies  Allergen Reactions   Ibuprofen Other (See Comments)    CKD stage 3. Should avoid.   Dilaudid [Hydromorphone Hcl] Itching and Other (See Comments)    Can take with Benadryl.   Iodinated Contrast Media Rash   Tramadol Itching    Review of Systems  All other systems reviewed and are negative.    OBJECTIVE:    Vitals:   02/09/23 1346  BP: (!) 175/93  Pulse: 84  Temp: 97.8 F (36.6 C)  TempSrc: Oral  SpO2: 98%   There is no height or weight on file to calculate BMI.  Physical Exam Constitutional:      General: [name] not in acute distress.    Appearance: Normal appearance.  Pulmonary:     Effort: Pulmonary effort is normal. No respiratory distress.  Musculoskeletal:     Cervical back: Normal range of motion and neck supple.     Comments: S/p BKA.  Skin:    General: Skin is warm and dry.  Neurological:     General: No focal deficit present.     Mental Status: [name] alert and oriented to person, place, and time.  Psychiatric:        Mood and Affect: Mood normal.        Behavior: Behavior normal.      Labs and Microbiology:    Latest Ref Rng & Units 12/24/2022     8:23 AM 12/22/2022    1:19 AM 12/19/2022    2:57 AM  CBC  WBC 4.0 - 10.5 K/uL 6.3  6.8  6.9   Hemoglobin 12.0 - 15.0 g/dL 8.3  9.2  9.4   Hematocrit 36.0 - 46.0 % 26.2  30.0  30.3   Platelets 150 - 400 K/uL 164  169  167       Latest Ref Rng & Units 12/24/2022    3:26 AM 12/23/2022    2:02 AM 12/22/2022    1:19 AM  CMP  Glucose 70 - 99 mg/dL 136  139  163   BUN 6 - 20 mg/dL 46  34  56   Creatinine 0.44 - 1.00 mg/dL 9.04  7.22  11.50   Sodium 135 - 145 mmol/L 135  132  135   Potassium 3.5 - 5.1 mmol/L 4.0  3.6  4.0   Chloride 98 - 111 mmol/L 97  98  99   CO2 22 - 32 mmol/L 21  21  24   $ Calcium 8.9 - 10.3 mg/dL 9.4  9.0  9.6      No results found for this or any previous visit (from the past 240 hour(s)).  Imaging:   ASSESSMENT & PLAN:    Mitral valve mass Patient has completed 2 courses of prolonged IV antibiotics x 6 weeks each.  All cultures and serologies for typical and atypical infectious etiologies have been negative.  It is unclear if this has truly represented infective endocarditis as would expect a vegetation to have some resorption following antibiotic  therapy.  She completed her second course of IV therapy on January 25, 2023.  She has a repeat TTE scheduled for later this month and will try to arrange follow-up with [facility] cardiothor[facility] surgery with referral that was placed a couple weeks ago.  At this time we will have her follow-up with ID as needed.   No orders of the defined types were placed in this encounter.       [facility] 02/09/2023, 2:02 PM   I have personally spent 20 minutes involved in face-to-face and non-face-to-face activities for this patient on the day of the visit. Professional time spent includes the following activities: Preparing to see the patient (review of tests), Obtaining and/or reviewing separately obtained history (admission/discharge record), Performing a  medically appropriate examination and/or evaluation , Ordering medications/tests/procedures, referring and communicating with other health care professionals, Documenting clinical information in the EMR, Independently interpreting results (not separately reported), Communicating results to the patient/family/caregiver, Counseling and educating the patient/family/caregiver and Care coordination (not separately reported).

## 2023-02-10 ENCOUNTER — Encounter: Payer: Self-pay | Admitting: Neurology

## 2023-02-24 ENCOUNTER — Ambulatory Visit (HOSPITAL_COMMUNITY)
Admission: RE | Admit: 2023-02-24 | Discharge: 2023-02-24 | Disposition: A | Discharge status: Home / Self Care | Payer: Medicare Other | Source: Ambulatory Visit | Attending: Internal Medicine | Admitting: Internal Medicine

## 2023-02-24 DIAGNOSIS — I058 Other rheumatic mitral valve diseases: Secondary | ICD-10-CM | POA: Insufficient documentation

## 2023-02-24 DIAGNOSIS — E119 Type 2 diabetes mellitus without complications: Secondary | ICD-10-CM | POA: Insufficient documentation

## 2023-02-24 DIAGNOSIS — I38 Endocarditis, valve unspecified: Secondary | ICD-10-CM | POA: Diagnosis not present

## 2023-02-24 DIAGNOSIS — I10 Essential (primary) hypertension: Secondary | ICD-10-CM | POA: Insufficient documentation

## 2023-02-24 DIAGNOSIS — I3139 Other pericardial effusion (noninflammatory): Secondary | ICD-10-CM | POA: Insufficient documentation

## 2023-02-24 DIAGNOSIS — E785 Hyperlipidemia, unspecified: Secondary | ICD-10-CM | POA: Diagnosis not present

## 2023-02-24 LAB — ECHOCARDIOGRAM COMPLETE
Area-P 1/2: 3.21 cm2
MV VTI: 2.28 cm2
S' Lateral: 2.6 cm

## 2023-02-28 ENCOUNTER — Other Ambulatory Visit: Payer: Self-pay | Admitting: Internal Medicine

## 2023-03-03 ENCOUNTER — Ambulatory Visit (INDEPENDENT_AMBULATORY_CARE_PROVIDER_SITE_OTHER): Payer: Medicare Other | Admitting: Family Medicine

## 2023-03-03 VITALS — BP 148/85 | HR 91 | Temp 97.8°F | Ht 69.0 in

## 2023-03-03 DIAGNOSIS — I152 Hypertension secondary to endocrine disorders: Secondary | ICD-10-CM | POA: Diagnosis not present

## 2023-03-03 DIAGNOSIS — Z794 Long term (current) use of insulin: Secondary | ICD-10-CM | POA: Diagnosis not present

## 2023-03-03 DIAGNOSIS — E1159 Type 2 diabetes mellitus with other circulatory complications: Secondary | ICD-10-CM | POA: Diagnosis not present

## 2023-03-03 DIAGNOSIS — R059 Cough, unspecified: Secondary | ICD-10-CM

## 2023-03-03 DIAGNOSIS — E119 Type 2 diabetes mellitus without complications: Secondary | ICD-10-CM

## 2023-03-03 MED ORDER — BENZONATATE 200 MG PO CAPS: 200.0000 mg | ORAL_CAPSULE | Freq: Two times a day (BID) | ORAL | 0 refills | Status: DC | PRN

## 2023-03-03 MED ORDER — NOVOLOG FLEXPEN 100 UNIT/ML ~~LOC~~ SOPN: 6.0000 [IU] | PEN_INJECTOR | Freq: Three times a day (TID) | SUBCUTANEOUS | Status: DC

## 2023-03-03 NOTE — Assessment & Plan Note (Signed)
Blood pressure mildly elevated today.  Typically well-controlled with dialysis.

## 2023-03-03 NOTE — Patient Instructions (Signed)
It was very nice to see you today!  Please start the Tessalon for your cough.  We will check an x-ray.  You may need to see a lung doctor depending on the results.  It is important that we keep good control of your blood sugar.  You can take 6 units 3 times daily with meals.  Please continue to monitor your sugar.  We will see you back in 1 to 2 months to recheck your A1c.  Take care, Dr Jerline Pain  PLEASE NOTE:  If you had any lab tests, please let us know if you have not heard back within a few days. You may see your results on mychart before we have a chance to review them but we will give you a call once they are reviewed by [name].   If we ordered any referrals today, please let us know if you have not heard from their office within the next week.   If you had any urgent prescriptions sent in today, please check with the pharmacy within an hour of our visit to make sure the prescription was transmitted appropriately.   Please try these tips to maintain a healthy lifestyle:  Eat at least 3 REAL meals and 1-2 snacks per day.  Aim for no more than 5 hours between eating.  If you eat breakfast, please do so within one hour of getting up.   Each meal should contain half fruits/vegetables, one quarter protein, and one quarter carbs (no bigger than a computer mouse)  Cut down on sweet beverages. This includes juice, soda, and sweet tea.   Drink at least 1 glass of water with each meal and aim for at least 8 glasses per day  Exercise at least 150 minutes every week.

## 2023-03-03 NOTE — Assessment & Plan Note (Signed)
Too early to recheck A1c today.  [name] currently prescribed 26 units of Tresiba daily and NovoLog 6 units 3 times daily with meals.  She has not been checking her sugar and has not been taking her insulin.  We discussed the importance of good glycemic control to prevent potential complications.  She does admit that she is worried about low blood sugar readings.  Discussed with patient that we can titrate the dose of her insulin as needed however he has very important for [name] to at least first get her glucose readings so that we can adjust.  She will go back to her previous dose of Tresiba 26 units daily and NovoLog 6 units 3 times daily with meals.  She can follow-up with me in a couple weeks via MyChart and we can titrate the dose of medications as needed.  She will come back in a month or 2 and we can recheck A1c at that time.

## 2023-03-03 NOTE — Progress Notes (Signed)
Heather [name] is a 52 y.o. female who presents today for an office visit.  Assessment/Plan:  New/Acute Problems: Cough No significant abnormalities on exam today though given that symptoms have been persistent for several weeks we will check x-ray.  She was treated with a course of amoxicillin prednisone about a month ago-do not need to repeat any antibiotics at this point.  She did not respond well to the promethazine-dextromethorphan cough syrup due to feeling "loopy." Depending on results of her xray need to have her follow back up with pulmonology.  She saw them a couple of years ago for chronic cough but has not seen them recently.  Looks like she was planning on having PFTs done last year however this was not completed.  Chronic Problems Addressed Today: Diabetes mellitus, type II, insulin dependent ([place]), on CGM, with end organ damage: renal, neuropathy, gastroparesis, retinopathy Too early to recheck A1c today.  [name] currently prescribed 26 units of Tresiba daily and NovoLog 6 units 3 times daily with meals.  She has not been checking her sugar and has not been taking her insulin.  We discussed the importance of good glycemic control to prevent potential complications.  She does admit that she is worried about low blood sugar readings.  Discussed with patient that we can titrate the dose of her insulin as needed however he has very important for [name] to at least first get her glucose readings so that we can adjust.  She will go back to her previous dose of Tresiba 26 units daily and NovoLog 6 units 3 times daily with meals.  She can follow-up with me in a couple weeks via MyChart and we can titrate the dose of medications as needed.  She will come back in a month or 2 and we can recheck A1c at that time.  Hypertension associated with diabetes ([place]) Blood pressure mildly elevated today.  Typically well-controlled with dialysis.    Subjective:  HPI:  See A/p for status of chronic  conditions.    [name] still having some cough. We saw her a month ago for this. Her strep test was positive and she was treated with a course of amoxicillin, prednisone, astelin, and a cough syrup. The sore throat has improved but cough has persisted.  She had bad reaction with cough syrup.  No fevers or chills.  She does occasionally get pain in her chest only with coughing.  No shortness of breath.  No fevers or chills.  We also discussed diabetes at her last office visit.  Insurance would not pay for [employer].  We continued her Tresiba 26 units daily and NovoLog 6 units 3 times daily with meals.  She does admit that she has not been checking her blood sugar routinely at home.  Also admits that she has been not routinely taking her insulin either.  [name] worried about low readings.  She does occasionally check her blood sugar and sometimes it is as low as the 50s.  Highest readings are usually in the low 200s.       Objective:  Physical Exam: BP (!) 148/85   Pulse 91   Temp 97.8 F (36.6 C) (Temporal)   Ht '5\' 9"'$  (1.753 m)   LMP 09/10/2021   SpO2 93%   BMI 33.52 kg/m   Gen: No acute distress, resting comfortably CV: Regular rate and rhythm with no murmurs appreciated Pulm: Normal work of breathing, clear to auscultation bilaterally with no crackles, wheezes, or  rhonchi Neuro: Grossly normal, moves all extremities Psych: Normal affect and thought content      Heather [name] M. Jerline Pain, MD 03/03/2023 10:37 AM

## 2023-03-07 ENCOUNTER — Encounter: Payer: Self-pay | Admitting: Neurology

## 2023-03-07 ENCOUNTER — Ambulatory Visit (INDEPENDENT_AMBULATORY_CARE_PROVIDER_SITE_OTHER): Payer: Medicare Other | Admitting: Neurology

## 2023-03-07 VITALS — BP 143/79 | HR 88 | Ht 69.0 in | Wt 227.0 lb

## 2023-03-07 DIAGNOSIS — I6340 Cerebral infarction due to embolism of unspecified cerebral artery: Secondary | ICD-10-CM | POA: Diagnosis not present

## 2023-03-07 DIAGNOSIS — E1142 Type 2 diabetes mellitus with diabetic polyneuropathy: Secondary | ICD-10-CM

## 2023-03-07 DIAGNOSIS — G44221 Chronic tension-type headache, intractable: Secondary | ICD-10-CM | POA: Diagnosis not present

## 2023-03-07 MED ORDER — PROPRANOLOL HCL 20 MG PO TABS: 10.0000 mg | ORAL_TABLET | Freq: Every day | ORAL | 2 refills | Status: DC

## 2023-03-07 NOTE — Patient Instructions (Signed)
Start propranol '10mg'$  daily  Send me an update in 4 weeks  Return to clinic in 3 months

## 2023-03-07 NOTE — Progress Notes (Signed)
[facility] Clinic Note - Initial Visit  Date: 03/07/2023   Heather [name] MRN: [id number] DOB: May 03, 1971   Dear Dr. Jerline Pain:  Thank you for your kind referral of Heather [name] for consultation of headaches. Although her history is well known to you, please allow [name] to reiterate it for the purpose of our medical record. The patient was accompanied to the clinic by husband who also provides collateral information.     Heather [name] is a delightful 52 y.o. 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   IMPRESSION/PLAN: Chronic tension headaches.  Unfortunately, given her multiple medical comorbidities, medical management options are very limited.  We discussed trying a low dose muscle relaxant, however, given potential risk of worsening lethargy and confusion, I opted against starting this.  Instead, I will start a very low dose of propranolol '10mg'$  daily for headache prevention.  Side effects discussed.   2.  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  She completed dual antiplatelet therapy and now on aspirin '81mg'$  daily.  [name] undergoing work-up by cardiology and CTS regarding management of mitral valve mass.   3.  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    Return to clinic in 2-3 months  ------------------------------------------------------------- History of present illness: Patient was hospitalized in October where she presented with confusion, lethargy, and weakness.  MRI brain showed embolic appearing acute stroke involving the right occipital, bilateral cerebellum, ad left frontal parietal region.  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  She was treated with 6 week course of vancomycin and cefepime and referred to [facility].  She was readmitted in December with diplopia concerning for brainstem stroke.  MRI brain was not performed as it would not change management.  Patient was started on aspirin and plavix for 21 days, then plavix alone.    She was having migraines in her 54s which resolved within a few years.  Starting around January 2024, she began having throbbing bitemporal pain.  Pain occurs about 3 times per week and lasts all day.  She takes Tylenol but does not get relief.  On one occasion, she took Aleve which helped.  She does not have significant nausea, vomiting, photophobia, or phonophobia.    She also has a long history of neuropathy due to diabetes and has right BKA, left foot drop, and distal paresthesias.   Despite the multitude of medical conditions, she continues to have a good attitude and works full-time from home.    Out-side paper records, electronic medical record, and images have been reviewed where available and summarized as:   CT head wo contrast 01/24/2023: 1. No acute intracranial abnormality. 2. Remote infarcts in both cerebellum. Minimal encephalomalacia at site of previous small parasagittal left frontal parietal infarct that was acute on recent imaging. 3. Stable atrophy and chronic small vessel ischemic changes.  MRI brain wo contrast 12/12/2022: 1. 2 foci of restricted diffusion within the subcortical white matter of the right occipital pole consistent with acute/subacute nonhemorrhagic infarcts. 2. 9 mm posterior left cerebellar infarct is new since the prior MRI, but not acute 3. Expected evolution of nonhemorrhagic infarct in the medial left frontal parietal cortex. 4. Moderate generalized atrophy and  advanced white matter disease likely reflects the sequela of chronic microvascular ischemia.   MRA head 10/22/2022: 1. Negative for  vessel occlusion. 2. Positive for progressed intracranial atherosclerosis since 2019 with new moderate to severe Left M2 and moderate Right M2 origin stenoses from prior MRA. Up to moderate bilateral supraclinoid ICA stenosis also appears increased on the Right.  MRI cervical spine 10/21/2022: 1. C4-C5 and C5-C6 mild spinal canal stenosis. 2. C3-C4 mild left neural foraminal narrowing.  MRI brain wo contrast 10/21/2022: Acute infarct in the left paramedian frontoparietal region, with punctate additional acute infarcts in the left frontal cortex and right occipital cortex. Given multiple vascular territories, an embolic etiology is suspected.  TTE 02/21/2023: 1. Left ventricular ejection fraction, by estimation, is 60 to 65%. The  left ventricle has normal function. The left ventricle has no regional  wall motion abnormalities. There is mild left ventricular hypertrophy.  Left ventricular diastolic parameters  are indeterminate.   2. Right ventricular systolic function is normal. The right ventricular  size is normal.   3. Left atrial size was mildly dilated.   4. A small pericardial effusion is present. The pericardial effusion is  posterior and lateral to the left ventricle.   5. Mean gradient in diastole at HR 90 bpm is 6 mmHg but MVA by PT1/2 is  estimated at 2.6 cm2. Mobile calcified mass in LA likely degenerative MAC  similar to TTE done 12/16/22 Mass seen better on TEE but still mobile .  The mitral valve is abnormal.  Trivial mitral valve regurgitation. No evidence of mitral stenosis. Severe  mitral annular calcification.   6. The aortic valve is tricuspid. There is moderate calcification of the  aortic valve. There is moderate thickening of the aortic valve. Aortic  valve regurgitation is not visualized. Aortic valve  sclerosis/calcification is present, without any evidence  of aortic stenosis.   7. The inferior vena cava is normal in size with greater than 50%  respiratory  variability, suggesting right atrial pressure of 3 mmHg.    TEE 12/16/2022: 1. Mass attached to posterior mitral valve calcified annulus measures  2.2cm x 0.5cm, similar in appearance to prior TEE 09/30/22. Very mobile,  prolapses across mitral valve in diastole   2. Left ventricular ejection fraction, by estimation, is 60 to 65%. The  left ventricle has normal function.   3. Right ventricular systolic function is normal. The right ventricular  size is normal.   4. The mitral valve is degenerative. Mild mitral valve regurgitation.  Moderate mitral annular calcification.   5. The aortic valve is tricuspid. Aortic valve regurgitation is not  visualized. Aortic valve sclerosis/calcification is present, without any  evidence of aortic stenosis.   6. No left atrial/left atrial appendage thrombus was detected.   7. Agitated saline contrast bubble study was positive with shunting  observed within 3-6 cardiac cycles suggestive of interatrial shunt.   8. Moderate pericardial effusion.   Lab Results  Component Value Date   HGBA1C 7.9 (H) 12/13/2022   Lab Results  Component Value Date   [id number] 12/13/2022   Lab Results  Component Value Date   TSH 1.630 12/13/2022   Lab Results  Component Value Date   ESRSEDRATE 52 (H) 09/30/2022    Past Medical History:  Diagnosis Date   Angio-edema    Antiplatelet or antithrombotic long-term use: Plavix 06/30/2017   B12 deficiency 05/10/2014   Blood transfusion without reported diagnosis    Chronic constipation    CVA (cerebral vascular  accident) [facility]), nonhemorrhagic, inferior right cerebellum 12/03/2017   left side weakness in leg   Cyst of left ovary 01/12/2018   Diabetic neuropathy, painful ([place]), on low dose Gabapentin 07/13/2013   DM (diabetes mellitus), type 2, uncontrolled, with renal complications [id number]   DM2 (diabetes mellitus, type 2) ([place])    Dysfunction of left eustachian tube, with pusatile tinnitus 11/24/2017    Dyslipidemia associated with type 2 diabetes mellitus ([place]) 06/25/2016   ESRD (end stage renal disease) on dialysis [facility])    On hemodialysis in July 2019 via [facility] then switched to [facility] in Nov 2019.    ESRD with anemia ([facility])    Fibromyalgia    Gastroparesis due to DM    GERD (gastroesophageal reflux disease)    Headache    Hypertension associated with diabetes ([place]) 02/19/2011   IBS (irritable bowel syndrome)    Left-sided weakness 12/10/2017   .   Leiomyoma of uterus    Pancreatitis    PE (pulmonary thromboembolism) ([place]) 11/04/2019   Pneumonia    Proliferative diabetic retinopathy ([place]) 09/29/2015   Pronation deformity of both feet 06/14/2014   Reactive depression 12/10/2017   .   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[place]) 02/17/2018   Thromboembolism ([place]) 04/05/2018   Upper airway cough syndrome, with recs to stay off ACE and take Pepcid q hs 11/15/2016   Urticaria     Past Surgical History:  Procedure Laterality Date   A/V FISTULAGRAM Right 03/03/2020   Procedure: Venogram;  Surgeon: Waynetta Sandy, MD;  Location: [place] [facility];  Service: Cardiovascular;  Laterality: Right;   ARTERY REPAIR Right 02/17/2018   Procedure: EXPLORATION OF RIGHT BRACHIAL ARTERY;  Surgeon: Conrad Greenbriar, MD;  Location: [place];  Service: Vascular;  Laterality: Right;   ARTERY REPAIR Right 02/17/2018   Procedure: BRACHIAL ARTERY EXPLORATION AND TRHOMBECTOMY;  Surgeon: Conrad [place], MD;  Location: [place];  Service: Vascular;  Laterality: Right;   AV FISTULA PLACEMENT Left 05/09/2017   Procedure: INSERTION OF ARTERIOVENOUS (AV) GRAFT ARM (ARTEGRAFT);  Surgeon: Conrad [place], MD;  Location: [place];  Service: Vascular;  Laterality: Left;   AV FISTULA PLACEMENT Right 02/17/2018   Procedure: INSERTION OF ARTERIOVENOUS (AV) GORE-TEX GRAFT ARM RIGHT UPPER ARM;  Surgeon: Conrad Ha[place], MD;  Location: [place];  Service: Vascular;  Laterality: Right;   AV FISTULA PLACEMENT Left 10/10/2019   Procedure: INSERTION OF LEFT ARTERIOVENOUS (AV) ARTEGRAFT GRAFT ARM;  Surgeon: Waynetta Sandy, MD;  Location: [place];  Service: Vascular;  Laterality: Left;   AV FISTULA PLACEMENT Right 03/19/2020   Procedure: INSERTION OF ARTERIOVENOUS (AV) GORE-TEX GRAFT IN RIGHT ARM AND VIABAHN STENT IN RIGHT AXILLARY ARTERY;  Surgeon: Waynetta Sandy, MD;  Location: [place];  Service: Vascular;  Laterality: Right;   [place] Left 08/31/2016   Procedure: BASILIC VEIN TRANSPOSITION FIRST STAGE;  Surgeon: Conrad Byron, MD;  Location: [place];  Service: Vascular;  Laterality: Left;   BRONCHIAL WASHINGS  09/17/2021   Procedure: BRONCHIAL WASHINGS;  Surgeon: Collene Gobble, MD;  Location: [facility];  Service: Cardiopulmonary;;   BUBBLE STUDY  12/16/2022   Procedure: BUBBLE STUDY;  Surgeon: Donato Heinz, MD;  Location: [place];  Service: Cardiovascular;;   CENTRAL VENOUS CATHETER INSERTION Left 07/24/2018   Procedure: INSERTION CENTRAL LINE ADULT;  Surgeon: Conrad [place], MD;  Location: [place];  Service:  Vascular;  Laterality: Left;   EYE SURGERY     secondary to diabetic retinopathy    FOOT SURGERY Right    t"ook bone out- maybe hammer toe"   INSERTION OF DIALYSIS CATHETER Left 07/24/2018   Procedure: INSERTION OF TUNNELED DIALYSIS CATHETER;  Surgeon: Conrad [place], MD;  Location: [place];  Service: Vascular;  Laterality: Left;   IR FLUORO GUIDE CV LINE LEFT  12/19/2021   IR FLUORO GUIDE CV LINE LEFT  12/29/2021   IR FLUORO GUIDE CV LINE RIGHT  07/21/2018   IR FLUORO GUIDE CV LINE RIGHT  06/01/2019   IR FLUORO GUIDE CV LINE RIGHT  05/05/2020   IR FLUORO GUIDE CV LINE RIGHT  10/04/2022   IR REMOVAL TUN CV CATH W/O FL  01/23/2021   IR REMOVAL TUN CV CATH W/O FL  12/19/2021   IR REMOVAL TUN CV CATH W/O FL  10/01/2022   IR US GUIDE VASC ACCESS LEFT  12/19/2021   IR US GUIDE VASC ACCESS  RIGHT  07/21/2018   IR US GUIDE VASC ACCESS RIGHT  06/01/2019   IR US GUIDE VASC ACCESS RIGHT  10/04/2022   LIGATION ARTERIOVENOUS GORTEX GRAFT Right 03/20/2020   Procedure: LIGATION ARM ARTERIOVENOUS GORTEX GRAFT With Patch Angioplasty, Thrombectomy;  Surgeon: Waynetta Sandy, MD;  Location: [place];  Service: Vascular;  Laterality: Right;   LIGATION OF ARTERIOVENOUS  FISTULA Left 05/09/2017   Procedure: LIGATION OF ARTERIOVENOUS  FISTULA;  Surgeon: Conrad [place]cock, MD;  Location: [place];  Service: Vascular;  Laterality: Left;   LOOP RECORDER INSERTION N/A 12/05/2017   Procedure: LOOP RECORDER INSERTION;  Surgeon: Evans Lance, MD;  Location: [place] [facility];  Service: Cardiovascular;  Laterality: N/A;   MYOMECTOMY     REMOVAL OF GRAFT Right 02/17/2018   Procedure: REMOVAL OF RIGHT UPPER ARM ARTERIOVENOUS GRAFT;  Surgeon: Conrad [place], MD;  Location: [place];  Service: Vascular;  Laterality: Right;   REVISION OF ARTERIOVENOUS GORETEX GRAFT Left 07/24/2018   Procedure: REDO ARTERIOVENOUS GORETEX GRAFT;  Surgeon: Conrad [place], MD;  Location: [place];  Service: Vascular;  Laterality: Left;   TEE WITHOUT CARDIOVERSION N/A 12/05/2017   Procedure: TRANSESOPHAGEAL ECHOCARDIOGRAM (TEE);  Surgeon: Sanda Klein, MD;  Location: [place];  Service: Cardiovascular;  Laterality: N/A;   TEE WITHOUT CARDIOVERSION N/A 09/30/2022   Procedure: TRANSESOPHAGEAL ECHOCARDIOGRAM (TEE);  Surgeon: Jerline Pain, MD;  [facility];  Service: Cardiovascular;  Laterality: N/A;   TEE WITHOUT CARDIOVERSION N/A 12/16/2022   Procedure: TRANSESOPHAGEAL ECHOCARDIOGRAM (TEE);  Surgeon: Donato Heinz, MD;  Location: [facility] ENDOSCOPY;  Service: Cardiovascular;  Laterality: N/A;   UPPER EXTREMITY VENOGRAPHY Left 07/13/2018   Procedure: UPPER EXTREMITY VENOGRAPHY - Central & Left Arm;  Surgeon: Conrad [place]ell, MD;  Location: [place] [facility];  Service: Cardiovascular;  Laterality: Left;   UPPER EXTREMITY  VENOGRAPHY Bilateral 09/10/2019   Procedure: UPPER EXTREMITY VENOGRAPHY;  Surgeon: Waynetta Sandy, MD;  Location: [place] [facility];  Service: Cardiovascular;  Laterality: Bilateral;   UTERINE FIBROID SURGERY     VIDEO BRONCHOSCOPY N/A 09/17/2021   Procedure: VIDEO BRONCHOSCOPY WITHOUT FLUORO;  Surgeon: Collene Gobble, MD;  Location: [facility];  Service: Cardiopulmonary;  Laterality: N/A;   VITRECTOMY Bilateral      Medications:  Outpatient Encounter Medications as of 03/07/2023  Medication Sig   aspirin 81 MG chewable tablet Chew 1 tablet (81 mg total) by mouth daily.   atorvastatin (LIPITOR) 80 MG tablet  Take 1 tablet (80 mg total) by mouth at bedtime.   azelastine (ASTELIN) 0.1 % nasal spray Place 2 sprays into both nostrils 2 (two) times daily.   benzonatate (TESSALON) 200 MG capsule Take 1 capsule (200 mg total) by mouth 2 (two) times daily as needed for cough.   calcium acetate (PHOSLO) 667 MG capsule Take 2,001 mg by mouth 3 (three) times daily with meals.   Continuous Blood Gluc Sensor (FREESTYLE LIBRE 14 DAY SENSOR) MISC 1 patch by Does not apply route every 14 (fourteen) days.   cromolyn (OPTICROM) 4 % ophthalmic solution Place 1 drop into both eyes 4 (four) times daily as needed. (Patient taking differently: Place 1 drop into both eyes 4 (four) times daily as needed (for allergies).)   Doxercalciferol (HECTOROL IV) Doxercalciferol (Hectorol)   fluticasone (FLONASE) 50 MCG/ACT nasal spray Place 2 sprays into both nostrils daily.   gabapentin (NEURONTIN) 100 MG capsule Take 1 capsule (100 mg total) by mouth 3 (three) times a week. With Dialysis   insulin aspart (NOVOLOG FLEXPEN) 100 UNIT/ML FlexPen Inject 6 Units into the skin 3 (three) times daily with meals.   insulin degludec (TRESIBA FLEXTOUCH) 100 UNIT/ML FlexTouch Pen Inject 26 Units into the skin daily.   Insulin Pen Needle 29G X 5MM MISC 1 Device by Does not apply route 4 (four) times daily.   ketoconazole  (NIZORAL) 2 % cream Apply 1 Application topically daily as needed for irritation.   loratadine (CLARITIN) 10 MG tablet Take 10 mg by mouth daily.   Methoxy PEG-Epoetin Beta (MIRCERA IJ) Mircera   midodrine (PROAMATINE) 5 MG tablet Take by mouth.   Olopatadine HCl (PATADAY) 0.2 % SOLN Apply 1 drop to eye daily. (Patient taking differently: Place 1 drop into both eyes daily as needed (dry eyes).)   pantoprazole (PROTONIX) 40 MG tablet TAKE 1 TABLET BY MOUTH EVERY DAY   No facility-administered encounter medications on file as of 03/07/2023.    Allergies:  Allergies  Allergen Reactions   Ibuprofen Other (See Comments)    CKD stage 3. Should avoid.   Dilaudid [Hydromorphone Hcl] Itching and Other (See Comments)    Can take with Benadryl.   Iodinated Contrast Media Rash   Tramadol Itching    Family History: Family History  Problem Relation Age of Onset   Colon polyps Mother    Diabetes Mother    Heart murmur Father        history essentially unknown   Hypertension Father    Diabetes Sister    Kidney failure Sister        kidney transplant   Diabetes Brother    Retinal degeneration Brother    Heart murmur Son    Stroke Paternal Grandmother    Diabetes Sister     Social History: Social History   Tobacco Use   Smoking status: Never    Passive exposure: Never   Smokeless tobacco: Never  Vaping Use   Vaping Use: Never used  Substance Use Topics   Alcohol use: No    Alcohol/week: 0.0 standard drinks of alcohol   Drug use: No   Social History   Social History Narrative   Patient lives with fiance. She has 1 grown son. Works full-time, she Paramedic for attorney.   Caffeine Use: 2 cups daily; sodas occasionally   She has 7 grandchildren      Raised by step father who is still living.       Right Handed    Lives  in a two story home but does not use the upstairs.     Vital Signs:  BP (!) 143/79   Pulse 88   Ht '5\' 9"'$  (1.753 m)   Wt 227 lb (103 kg)   LMP  09/10/2021   SpO2 98%   BMI 33.52 kg/m   Neurological Exam: MENTAL STATUS including orientation to time, place, person, recent and remote memory, attention span and concentration, language, and fund of knowledge is normal.  Speech is not dysarthric.  CRANIAL NERVES: II:  No visual field defects.     III-IV-VI: Pupils equal round and reactive to light.  Normal conjugate, extra-ocular eye movements in all directions of gaze.  No nystagmus.  No ptosis.   V:  Normal facial sensation.    VII:  Normal facial symmetry and movements.   VIII:  Normal hearing and vestibular function.   IX-X:  Normal palatal movement.   XI:  Normal shoulder shrug and head rotation.   XII:  Normal tongue strength and range of motion, no deviation or fasciculation.  MOTOR:  Intrinsic hand muscle atrophy, no fasciculations or abnormal movements.  No pronator drift. S/p right BKA  Upper Extremity:  Right  Left  Deltoid  5/5   5/5   Biceps  5/5   5/5   Triceps  5/5   5/5   Wrist extensors  5/5   5/5   Wrist flexors  5/5   5/5   Finger extensors  4/5   4/5   Finger flexors  4/5   4/5   Dorsal interossei  4/5   4/5   Abductor pollicis  4/5   4/5   Tone ([place] scale)  0  0   Lower Extremity:  Right  Left  Hip flexors  5/5   5/5   Knee flexors  -  5/5   Knee extensors  -  5-/5   Dorsiflexors  -  2/5    MSRs:                                           Right        Left brachioradialis 1+  1+  biceps 1+  1+  triceps 1+  1+  patellar tr  tr  ankle jerk -  0   SENSORY:  Absent vibration below the left ankle.    COORDINATION/GAIT: Normal finger-to- nose-finger.  Gait not tested, patient in wheelchair  Total time spent reviewing records, interview, history/exam, documentation, and coordination of care on day of encounter:  60 min   Thank you for allowing me to participate in patient's care.  If I can answer any additional questions, I would be pleased to do so.    Sincerely,    Cristina Mattern K. Posey Pronto,  DO

## 2023-03-15 ENCOUNTER — Encounter: Payer: Self-pay | Admitting: Internal Medicine

## 2023-03-15 ENCOUNTER — Ambulatory Visit: Payer: Medicare Other | Attending: Internal Medicine | Admitting: Internal Medicine

## 2023-03-15 VITALS — BP 138/82 | HR 86 | Ht 69.0 in | Wt 227.0 lb

## 2023-03-15 DIAGNOSIS — I330 Acute and subacute infective endocarditis: Secondary | ICD-10-CM | POA: Diagnosis present

## 2023-03-15 DIAGNOSIS — I639 Cerebral infarction, unspecified: Secondary | ICD-10-CM | POA: Diagnosis present

## 2023-03-15 DIAGNOSIS — I5189 Other ill-defined heart diseases: Secondary | ICD-10-CM

## 2023-03-15 NOTE — Patient Instructions (Signed)
Medication Instructions:  No Changes In Medications at this time.  *If you need a refill on your cardiac medications before your next appointment, please call your pharmacy*  Lab Work: None Ordered At This Time.  If you have labs (blood work) drawn today and your tests are completely normal, you will receive your results only by: MyChart Message (if you have MyChart) OR A paper copy in the mail If you have any lab test that is abnormal or we need to change your treatment, we will call you to review the results.  Testing/Procedures: None Ordered At This Time.   Follow-Up: At [facility], you and your health needs are our priority.  As part of our continuing mission to provide you with exceptional heart care, we have created designated Provider Care Teams.  These Care Teams include your primary Cardiologist (physician) and Advanced Practice Providers (APPs -  Physician Assistants and Nurse Practitioners) who all work together to provide you with the care you need, when you need it.  Your next appointment:   3 month(s)  Provider:   Branch, Mary E, MD    

## 2023-03-15 NOTE — Progress Notes (Signed)
Cardiology Office Note:    Date:  03/15/2023   ID:  Heather [name], DOB [birth year], MRN [id number]  PCP:  [name], MD   [facility] Providers Cardiologist:  [name], MD     Referring MD: [name], MD   No chief complaint on file. RA thrombus/Pericardial Effusion  History of Present Illness:    Heather [name] is a 52 y.o. female with a hx of ESRD on MWF HD via HD cath, history of CVA, insulin-dependent T2DM, HTN, Charcot foot s/p right BKA, history of PE/DVT , MWF [facility],  anemia of chronic kidney disease  referral for pericardial effusion, and hx of 3 minutes of PEA arrest during IHD catheter placement 01/2022, RA thrombus associated with IHD cather, MSK pain post chest compressions  She's been hospitalized a few times in the last 6 months. She had a dislodged HD catheter on 12/23. K was 6 at that time. CXR confirmed retracted left-sided dialysis catheter with tip extravascular in chest wall. IR was consulted 12/24 for urgent HD catheter placement and dialysis same day.   In January , she had malfunctioning HD catheter and missed to HD sessions. She had a catheter exchange of the left IJ catheter for temporary use on 12/29/2021.  In early February, she went to [facility] for elective HERO graft surgery. She started the procedure and  after 42F catheter was inserted into the IVC, she developed PEA arrest with 3 minutes of chest compressions, epix2. She achieved ROSC. She had intra-op TEE showing pericardial effusion but no tamponade. TEE also noted RA mass c/f thrombus. She was started on eliquis. Lactate went up to 7.   Has not seen cardiology. She does not have heart disease. She had a lexiscan stress test a long time ago. No heart catheterization. She denies CP, shortness of breath. On her CT PE, cac was noted on non gated scan.   [name] still on eliquis 5 mg BID. She started it on Februrary 15th, 2023. The catheter was not replaced.   On crestor 10 mg daily. No  recent LDL.  Family Hx: father has heart disease s/p stent. No heart disease history. Siblings- brother had MI; passed away, and older sister  Social Hx: no smoking hx. She's mainly in her wheelchair. She works from home. She was here with her husband   Interim Hx Echo in May noted to have persistent calcified thrombus in the RA. Also had moderate pericardial effusion without tamponade, this is stable. No evidence of PFO. She saw Dr. Kipp Brood for evaluation for angiovac. The benefit was not felt to outweigh the risks. Today, her blood pressure was quite elevated today 199/98 mmHg.  Interim Hx 09/21/2022 [name] doing well. Dialysis stopped her eliquis. She denies PND/orthopnea. She has a URI.  Planning for TEE, she had recent limited study showing a small mobile mass  on the mitral valve. The RA was not well visualized. Small pericardial effusion. No significant valve insufficiency/stenosis  Interim Hx 03/15/2023 She was admitted in December of 2023 with an acute CVA. 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 MRI brain on admission was positive for new possible embolic right occipital infarcts when compared to October.  She had a TEE with + bubble study for a small PFO. Her recommendation was to FU at [facility] with CT SUR. Today, she notes persistent cough, has conjunctival injections; recommended allergy medication.  She saw CT  SUR at [facility], recommended another TEE. They repeated her echo with unchanged mass. They also requested more blood cultures.  No fevers or chills today. No chest pain or shortness of breath. She has big surprise this summer. He denies angina, dyspnea on exertion, lower extremity edema, PND or orthopnea.    Cardiology Studies: Echoes in Feb. Have been similar. Unchanged mobile posterior mitral valve mass TEE 12/16/2022 Mass attached to posterior mitral valve calcified annulus measures   2.2cm x 0.5cm, similar in appearance to prior TEE 09/30/22. Very mobile,  prolapses across mitral valve in diastole. No RA mass. Moderately sized pericardial effusion Agitated saline contrast bubble study was positive with shunting  observed within 3-6 cardiac cycles  suggestive of interatrial shunt.    TTE 12/13/2022 There is a mass attached to the posterior MV calcified annulus that  measures 1.9 cm x 0.5 cm.  EF 60-65%, nl RV fxn;2.4 x 0.4 cm vegetation attached below posterior leaflet. The mitral  valve is degenerative. Mild mitral valve regurgitation.  Thrombus material on dialysis catheter leaflet tip in the right atrium  (chronic seen on prior study).   TTE Limited 09/14/2022 EF normal; Small mobile mass 0.9 x0.6 cm on the posterior MV annulus Ddx includes calcification vs. vegetation.  At [facility] 01/29/2022- TEE: Large venous sheath visible from SVC traversing the RA and entering the IVC. Large circumferential pericardial effusion without evidence of tamponade. No PFO. Grossly normal mitral, aortic, pulmonic, and tricuspid valve function.   01/29/2022 - limited echo- POSSIBLE MASS ATTACHED TO RA PORT CATH, DOSEN'T UPTAKE DEFINITY CONTRAST    WHEN GIVEN SUGGESTING THROMBUS OR ENDOCARDITIS.     TTE 04/27/2022 EF normal.Normal RV function No significant valve disease Moderate pericardial effusion Calcified RA thrombus remains  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[place]), nonhemorrhagic, inferior right cerebellum 12/03/2017   left side weakness in leg   Cyst of left ovary 01/12/2018   Diabetic neuropathy, painful ([place]), on low dose Gabapentin 07/13/2013   DM (diabetes mellitus), type 2, uncontrolled, with renal complications 24/40/1027   DM2 (diabetes mellitus, type 2)  ([place])    Dysfunction of left eustachian tube, with pusatile tinnitus 11/24/2017   Dyslipidemia associated with type 2 diabetes mellitus ([place]) 06/25/2016   ESRD (end stage renal disease) on dialysis [facility])    On hemodialysis in July 2019 via [facility] then switched to [facility] in Nov 2019.    ESRD with anemia (HCC)    Fibromyalgia    Gastroparesis due to DM    GERD (gastroesophageal reflux disease)    Headache    Hypertension associated with diabetes ([place]) 02/19/2011   IBS (irritable bowel syndrome)    Left-sided weakness 12/10/2017   .   Leiomyoma of uterus    Pancreatitis    PE (pulmonary thromboembolism) ([place]) 11/04/2019   Pneumonia    Proliferative diabetic retinopathy ([place]) 09/29/2015   Pronation deformity of both feet 06/14/2014   Reactive depression 12/10/2017   .   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[place]) 02/17/2018   Thromboembolism ([place]) 04/05/2018   Upper airway cough syndrome, with recs to stay off ACE and take Pepcid q hs 11/15/2016   Urticaria     Past Surgical History:  Procedure  Laterality Date   A/V FISTULAGRAM Right 03/03/2020   Procedure: Venogram;  Surgeon: Waynetta Sandy, MD;  Location: [place] [facility];  Service: Cardiovascular;  Laterality: Right;   ARTERY REPAIR Right 02/17/2018   Procedure: EXPLORATION OF RIGHT BRACHIAL ARTERY;  Surgeon: Conrad Willard, MD;  Location: [place];  Service: Vascular;  Laterality: Right;   ARTERY REPAIR Right 02/17/2018   Procedure: BRACHIAL ARTERY EXPLORATION AND TRHOMBECTOMY;  Surgeon: Conrad [place] Place, MD;  Location: [place];  Service: Vascular;  Laterality: Right;   AV FISTULA PLACEMENT Left 05/09/2017   Procedure: INSERTION OF ARTERIOVENOUS (AV) GRAFT ARM (ARTEGRAFT);  Surgeon: Conrad Magnolia, MD;  Location: [place];  Service: Vascular;  Laterality: Left;   AV FISTULA PLACEMENT Right 02/17/2018   Procedure: INSERTION OF ARTERIOVENOUS  (AV) GORE-TEX GRAFT ARM RIGHT UPPER ARM;  Surgeon: Conrad Long, MD;  Location: [place];  Service: Vascular;  Laterality: Right;   AV FISTULA PLACEMENT Left 10/10/2019   Procedure: INSERTION OF LEFT ARTERIOVENOUS (AV) ARTEGRAFT GRAFT ARM;  Surgeon: Waynetta Sandy, MD;  Location: [place];  Service: Vascular;  Laterality: Left;   AV FISTULA PLACEMENT Right 03/19/2020   Procedure: INSERTION OF ARTERIOVENOUS (AV) GORE-TEX GRAFT IN RIGHT ARM AND VIABAHN STENT IN RIGHT AXILLARY ARTERY;  Surgeon: Waynetta Sandy, MD;  Location: [place];  Service: Vascular;  Laterality: Right;   [place] Left 08/31/2016   Procedure: BASILIC VEIN TRANSPOSITION FIRST STAGE;  Surgeon: Conrad Jamesville, MD;  Location: [place];  Service: Vascular;  Laterality: Left;   BRONCHIAL WASHINGS  09/17/2021   Procedure: BRONCHIAL WASHINGS;  Surgeon: Collene Gobble, MD;  Location: [facility];  Service: Cardiopulmonary;;   BUBBLE STUDY  12/16/2022   Procedure: BUBBLE STUDY;  Surgeon: Donato Heinz, MD;  Location: [place];  Service: Cardiovascular;;   CENTRAL VENOUS CATHETER INSERTION Left 07/24/2018   Procedure: INSERTION CENTRAL LINE ADULT;  Surgeon: Conrad Decherd, MD;  Location: [place];  Service: Vascular;  Laterality: Left;   EYE SURGERY     secondary to diabetic retinopathy    FOOT SURGERY Right    t"ook bone out- maybe hammer toe"   INSERTION OF DIALYSIS CATHETER Left 07/24/2018   Procedure: INSERTION OF TUNNELED DIALYSIS CATHETER;  Surgeon: Conrad , MD;  Location: [place];  Service: Vascular;  Laterality: Left;   IR FLUORO GUIDE CV LINE LEFT  12/19/2021   IR FLUORO GUIDE CV LINE LEFT  12/29/2021   IR FLUORO GUIDE CV LINE RIGHT  07/21/2018   IR FLUORO GUIDE CV LINE RIGHT  06/01/2019   IR FLUORO GUIDE CV LINE RIGHT  05/05/2020   IR FLUORO GUIDE CV LINE RIGHT  10/04/2022   IR REMOVAL TUN CV CATH W/O FL  01/23/2021   IR REMOVAL TUN CV CATH W/O FL  12/19/2021   IR REMOVAL TUN CV CATH W/O FL   10/01/2022   IR US GUIDE VASC ACCESS LEFT  12/19/2021   IR US GUIDE VASC ACCESS RIGHT  07/21/2018   IR US GUIDE VASC ACCESS RIGHT  06/01/2019   IR US GUIDE VASC ACCESS RIGHT  10/04/2022   LIGATION ARTERIOVENOUS GORTEX GRAFT Right 03/20/2020   Procedure: LIGATION ARM ARTERIOVENOUS GORTEX GRAFT With Patch Angioplasty, Thrombectomy;  Surgeon: Waynetta Sandy, MD;  Location: [place];  Service: Vascular;  Laterality: Right;   LIGATION OF ARTERIOVENOUS  FISTULA Left 05/09/2017   Procedure: LIGATION OF ARTERIOVENOUS  FISTULA;  Surgeon: Conrad , MD;  [facility];  Service: Vascular;  Laterality: Left;   LOOP RECORDER INSERTION N/A 12/05/2017   Procedure: LOOP RECORDER INSERTION;  Surgeon: Evans Lance, MD;  Location: [place] [facility];  Service: Cardiovascular;  Laterality: N/A;   MYOMECTOMY     REMOVAL OF GRAFT Right 02/17/2018   Procedure: REMOVAL OF RIGHT UPPER ARM ARTERIOVENOUS GRAFT;  Surgeon: Conrad Charco, MD;  Location: [place];  Service: Vascular;  Laterality: Right;   REVISION OF ARTERIOVENOUS GORETEX GRAFT Left 07/24/2018   Procedure: REDO ARTERIOVENOUS GORETEX GRAFT;  Surgeon: Conrad Colman, MD;  Location: [place];  Service: Vascular;  Laterality: Left;   TEE WITHOUT CARDIOVERSION N/A 12/05/2017   Procedure: TRANSESOPHAGEAL ECHOCARDIOGRAM (TEE);  Surgeon: Sanda Klein, MD;  Location: [place];  Service: Cardiovascular;  Laterality: N/A;   TEE WITHOUT CARDIOVERSION N/A 09/30/2022   Procedure: TRANSESOPHAGEAL ECHOCARDIOGRAM (TEE);  Surgeon: Jerline Pain, MD;  Location: [facility];  Service: Cardiovascular;  Laterality: N/A;   TEE WITHOUT CARDIOVERSION N/A 12/16/2022   Procedure: TRANSESOPHAGEAL ECHOCARDIOGRAM (TEE);  Surgeon: Donato Heinz, MD;  Location: [facility] ENDOSCOPY;  Service: Cardiovascular;  Laterality: N/A;   UPPER EXTREMITY VENOGRAPHY Left 07/13/2018   Procedure: UPPER EXTREMITY VENOGRAPHY - Central & Left Arm;  Surgeon: Conrad [place], MD;  Location: [place] [facility];  Service: Cardiovascular;  Laterality: Left;   UPPER EXTREMITY VENOGRAPHY Bilateral 09/10/2019   Procedure: UPPER EXTREMITY VENOGRAPHY;  Surgeon: Waynetta Sandy, MD;  Location: [place] [facility];  Service: Cardiovascular;  Laterality: Bilateral;   UTERINE FIBROID SURGERY     VIDEO BRONCHOSCOPY N/A 09/17/2021   Procedure: VIDEO BRONCHOSCOPY WITHOUT FLUORO;  Surgeon: Collene Gobble, MD;  Location: [facility];  Service: Cardiopulmonary;  Laterality: N/A;   VITRECTOMY Bilateral     Current Medications: Current Meds  Medication Sig   aspirin 81 MG chewable tablet Chew 1 tablet (81 mg total) by mouth daily.   atorvastatin (LIPITOR) 80 MG tablet Take 1 tablet (80 mg total) by mouth at bedtime.   azelastine (ASTELIN) 0.1 % nasal spray Place 2 sprays into both nostrils 2 (two) times daily.   benzonatate (TESSALON) 200 MG capsule Take 1 capsule (200 mg total) by mouth 2 (two) times daily as needed for cough.   calcium acetate (PHOSLO) 667 MG capsule Take 2,001 mg by mouth 3 (three) times daily with meals.   Continuous Blood Gluc Sensor (FREESTYLE LIBRE 14 DAY SENSOR) MISC 1 patch by Does not apply route every 14 (fourteen) days.   cromolyn (OPTICROM) 4 % ophthalmic solution Place 1 drop into both eyes 4 (four) times daily as needed. (Patient taking differently: Place 1 drop into both eyes 4 (four) times daily as needed (for allergies).)   Doxercalciferol (HECTOROL IV) Doxercalciferol (Hectorol)   fluticasone (FLONASE) 50 MCG/ACT nasal spray Place 2 sprays into both nostrils daily.   gabapentin (NEURONTIN) 100 MG capsule Take 1 capsule (100 mg total) by mouth 3 (three) times a week. With Dialysis   insulin aspart (NOVOLOG FLEXPEN) 100 UNIT/ML FlexPen Inject 6 Units into the skin 3 (three) times daily with meals.   insulin degludec (TRESIBA FLEXTOUCH) 100 UNIT/ML FlexTouch Pen Inject 26 Units into the skin daily.   Insulin Pen Needle 29G X 5MM MISC 1 Device by Does not apply  route 4 (four) times daily.   ketoconazole (NIZORAL) 2 % cream Apply 1 Application topically daily as needed for irritation.   loratadine (CLARITIN) 10 MG tablet Take 10 mg by mouth daily.  Methoxy PEG-Epoetin Beta (MIRCERA IJ) Mircera   midodrine (PROAMATINE) 5 MG tablet Take by mouth.   Olopatadine HCl (PATADAY) 0.2 % SOLN Apply 1 drop to eye daily. (Patient taking differently: Place 1 drop into both eyes daily as needed (dry eyes).)   pantoprazole (PROTONIX) 40 MG tablet TAKE 1 TABLET BY MOUTH EVERY DAY   propranolol (INDERAL) 20 MG tablet Take 0.5 tablets (10 mg total) by mouth daily.     Allergies:   Ibuprofen, Dilaudid [hydromorphone hcl], Iodinated contrast media, and Tramadol   Social History   Socioeconomic History   Marital status: Married    Spouse name: Not on file   Number of children: 1   Years of education: college   Highest education level: Not on file  Occupational History   Occupation: customer service rep    Employer: [employer]  Tobacco Use   Smoking status: Never    Passive exposure: Never   Smokeless tobacco: Never  Vaping Use   Vaping Use: Never used  Substance and Sexual Activity   Alcohol use: No    Alcohol/week: 0.0 standard drinks of alcohol   Drug use: No   Sexual activity: Not Currently    Partners: Male    Birth control/protection: I.U.D.  Other Topics Concern   Not on file  Social History Narrative   Patient lives with fiance. She has 1 grown son. Works full-time, she Paramedic for attorney.   Caffeine Use: 2 cups daily; sodas occasionally   She has 7 grandchildren      Raised by step father who is still living.       Right Handed    Lives in a two story home but does not use the upstairs.    Social Determinants of Health   Financial Resource Strain: Low Risk  (10/26/2022)   Overall Financial Resource Strain (CARDIA)    Difficulty of Paying Living Expenses: Not hard at all  Food Insecurity: No Food Insecurity  (12/15/2022)   Hunger Vital Sign    Worried About Running Out of Food in the Last Year: Never true    Ran Out of Food in the Last Year: Never true  Transportation Needs: No Transportation Needs (12/15/2022)   PRAPARE - Hydrologist (Medical): No    Lack of Transportation (Non-Medical): No  Physical Activity: Inactive (10/26/2022)   Exercise Vital Sign    Days of Exercise per Week: 0 days    Minutes of Exercise per Session: 0 min  Stress: No Stress Concern Present (10/26/2022)   [place]    Feeling of Stress : Not at all  Social Connections: Moderately Integrated (10/26/2022)   Social Connection and Isolation Panel [NHANES]    Frequency of Communication with Friends and Family: More than three times a week    Frequency of Social Gatherings with Friends and Family: More than three times a week    Attends Religious Services: More than 4 times per year    Active Member of [employer] or Organizations: No    Attends Music therapist: Never    Marital Status: Married     Family History: The patient's family history includes Colon polyps in her mother; Diabetes in her brother, mother, sister, and sister; Heart murmur in her father and son; Hypertension in her father; Kidney failure in her sister; Retinal degeneration in her brother; Stroke in her paternal grandmother.  ROS:   Please see  the history of present illness.     All other systems reviewed and are negative.  EKGs/Labs/Other Studies Reviewed:    The following studies were reviewed today:   EKG:  EKG is  ordered today.  The ekg ordered today demonstrates   Prior EKG, NSR, low voltage, poor R wave progression  Recent Labs: 12/12/2022: ALT 61 12/13/2022: TSH 1.630 12/19/2022: Magnesium 2.2 12/24/2022: BUN 46; Creatinine, Ser 9.04; Hemoglobin 8.3; Platelets 164; Potassium 4.0; Sodium 135   Recent Lipid Panel     Component Value Date/Time   CHOL 212 (H) 12/13/2022 2055   CHOL 277 02/13/2013 0000   TRIG 178 (H) 12/13/2022 2055   TRIG 200 02/13/2013 0000   HDL 41 12/13/2022 2055   CHOLHDL 5.2 12/13/2022 2055   VLDL 36 12/13/2022 2055   LDLCALC 135 (H) 12/13/2022 2055   LDLCALC 195 02/13/2013 0000   LDLDIRECT 120.0 04/17/2015 1141     Risk Assessment/Calculations:           Physical Exam:     Wt Readings from Last 3 Encounters:  03/15/23 227 lb (103 kg)  03/07/23 227 lb (103 kg)  01/24/23 227 lb (103 kg)    Vitals:   03/15/23 0915  BP: 138/82  Pulse: 86   Physical Exam Gen: well appearing, in a wheelchair Neuro: alert and oriented CV: r,r,r no murmurs. 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 She was managed with cefepime and vanc s/p tx cefepime/ vanc 11/2022. TEE,Repeat TTE have all continued to demonstrate this. She was seen at [facility] with discussion regarding MV intervention, was sent for consideration of angiovac. She was instructed to have her local cardiologist to repeat her TEE. I called their office to get a better understanding of the utility in repeating her study. She has had CVAs and at risk for recurrence. She has tolerated sedation for TEE. However her adverse cardiac event risk is increased with IHD and prior PEA arrest. That being said, happy to do a cardiac stress if an MV procedure will be considered. Will aim to FU with CT SUR at [facility] to discuss her case.  RA thrombus associated with IHD catheter. Seen on TTE at [facility] in February, 2023. Not seen on TEE 12/16/2022. S/p AC.   PFO: small and unlikely main culprit for her CVA  #Pericardial effusion: thought uremic in the setting of  ESRD/dialysis associated. No tamponade physiology. No signs of pericarditis. Stable  HLD; continue crestor 10 ( not taking) ( no strong data with IHD patients, this is ok)  HTN: Bp well controlled. Was on norvasc 5mg - olmesartan 20 mg daily which was stopped in the hospital 11/2022, she is on propanolol 10 mg daily   PLAN:    In order of problems listed above:    Follow up in 3 months       Medication Adjustments/Labs and Tests Ordered: Current medicines are reviewed at length with the patient today.  Concerns regarding medicines are outlined above.  No orders of the defined types were placed in this encounter.  No orders of the defined types were placed in this encounter.   Patient Instructions  Medication Instructions:  No Changes In Medications at this time.  *If you  need a refill on your cardiac medications before your next appointment, please call your pharmacy*  Lab Work: None Ordered At This Time.  If you have labs (blood work) drawn today and your tests are completely normal, you will receive your results only by: [place] (if you have MyChart) OR A paper copy in the mail If you have any lab test that is abnormal or we need to change your treatment, we will call you to review the results.  Testing/Procedures: None Ordered At This Time.   Follow-Up: At [facility], you and your health needs are our priority.  As part of our continuing mission to provide you with exceptional heart care, we have created designated Provider Care Teams.  These Care Teams include your primary Cardiologist (physician) and Advanced Practice Providers (APPs -  Physician Assistants and Nurse Practitioners) who all work together to provide you with the care you need, when you need it.  Your next appointment:   3 month(s)  Provider:   [name], MD      Signed, [name], MD  03/15/2023 1:55 PM    [place]

## 2023-03-16 NOTE — Telephone Encounter (Signed)
Called Mrs. Heather [name] and discussed the conversation I had with Dr. Evelina Dun. [name] high risk for surgery considering IHD/ prior PEA. However, she has had a few CVAs. We discussed that with cessation of + blood cultures, risk of recurrence is lower. Considering risk/benefit, we decided that deferring mitral valve replacement unless she develops + cultures and/or recurrent symptoms of a CVA.

## 2023-04-04 ENCOUNTER — Ambulatory Visit: Payer: Medicare Other | Admitting: Family Medicine

## 2023-04-07 ENCOUNTER — Encounter: Payer: Self-pay | Admitting: Gastroenterology

## 2023-04-07 ENCOUNTER — Other Ambulatory Visit: Payer: Self-pay | Admitting: Family Medicine

## 2023-04-07 ENCOUNTER — Ambulatory Visit (INDEPENDENT_AMBULATORY_CARE_PROVIDER_SITE_OTHER): Payer: Medicare Other | Admitting: Family Medicine

## 2023-04-07 ENCOUNTER — Encounter: Payer: Self-pay | Admitting: Family Medicine

## 2023-04-07 VITALS — BP 173/85 | HR 85 | Temp 97.1°F | Ht 69.0 in

## 2023-04-07 DIAGNOSIS — Z89511 Acquired absence of right leg below knee: Secondary | ICD-10-CM

## 2023-04-07 DIAGNOSIS — Z1211 Encounter for screening for malignant neoplasm of colon: Secondary | ICD-10-CM | POA: Diagnosis not present

## 2023-04-07 DIAGNOSIS — I152 Hypertension secondary to endocrine disorders: Secondary | ICD-10-CM

## 2023-04-07 DIAGNOSIS — E1159 Type 2 diabetes mellitus with other circulatory complications: Secondary | ICD-10-CM | POA: Diagnosis not present

## 2023-04-07 DIAGNOSIS — E119 Type 2 diabetes mellitus without complications: Secondary | ICD-10-CM

## 2023-04-07 DIAGNOSIS — Z794 Long term (current) use of insulin: Secondary | ICD-10-CM | POA: Diagnosis not present

## 2023-04-07 DIAGNOSIS — Z1231 Encounter for screening mammogram for malignant neoplasm of breast: Secondary | ICD-10-CM

## 2023-04-07 LAB — POCT GLYCOSYLATED HEMOGLOBIN (HGB A1C): Hemoglobin A1C: 5.9 % — AB (ref 4.0–5.6)

## 2023-04-07 NOTE — Assessment & Plan Note (Signed)
A1c 5.9.  Patient was upfront with me and told me that she had not been taking any insulin for the last several months.  Given that her A1c is 5.9 without any medications advised patient that it is okay for her to stay off of insulin completely at this time.  Discussed with patient I appreciated her being honest with [name] about her insulin usage to make sure we are accurately managing her diabetes and that there is significant risk especially with insulin management if we do not have accurate data to work off.  She has been working on diet and exercise which I think has helped significantWith her A1c as well.  We did discuss importance of routine glucose monitoring so that we can make sure that we are accurately titrating her dose of insulin as needed.  She stated that she does have a continuous glucose monitor at home and she will apply new patch later today.  She will continue to work on diet and exercise and stay off of all of her insulin.  She will come back in 3 months and we will recheck A1c at that time.

## 2023-04-07 NOTE — Assessment & Plan Note (Signed)
Currently in manual wheelchair though desires electric wheelchair.  Will give prescription today.

## 2023-04-07 NOTE — Patient Instructions (Signed)
It was very nice to see you today!  It is okay for you to stay off of the insulin.  Please continue to monitor your blood sugar.  No other medication changes today.  I will see you back in 3 months to recheck your A1c.  Please come back to see [name] sooner if needed.  Take care, Dr [name]  PLEASE NOTE:  If you had any lab tests, please let us know if you have not heard back within a few days. You may see your results on mychart before we have a chance to review them but we will give you a call once they are reviewed by [name].   If we ordered any referrals today, please let us know if you have not heard from their office within the next week.   If you had any urgent prescriptions sent in today, please check with the pharmacy within an hour of our visit to make sure the prescription was transmitted appropriately.   Please try these tips to maintain a healthy lifestyle:  Eat at least 3 REAL meals and 1-2 snacks per day.  Aim for no more than 5 hours between eating.  If you eat breakfast, please do so within one hour of getting up.   Each meal should contain half fruits/vegetables, one quarter protein, and one quarter carbs (no bigger than a computer mouse)  Cut down on sweet beverages. This includes juice, soda, and sweet tea.   Drink at least 1 glass of water with each meal and aim for at least 8 glasses per day  Exercise at least 150 minutes every week.

## 2023-04-07 NOTE — Progress Notes (Signed)
   Heather [name] is a 52 y.o. female who presents today for an office visit.  Assessment/Plan:  Chronic Problems Addressed Today: Diabetes mellitus, type II, insulin dependent (HCC), on CGM, with end organ damage: renal, neuropathy, gastroparesis, retinopathy A1c 5.9.  Patient was upfront with me and told me that she had not been taking any insulin for the last several months.  Given that her A1c is 5.9 without any medications advised patient that it is okay for her to stay off of insulin completely at this time.  Discussed with patient I appreciated her being honest with [name] about her insulin usage to make sure we are accurately managing her diabetes and that there is significant risk especially with insulin management if we do not have accurate data to work off.  She has been working on diet and exercise which I think has helped significantWith her A1c as well.  We did discuss importance of routine glucose monitoring so that we can make sure that we are accurately titrating her dose of insulin as needed.  She stated that she does have a continuous glucose monitor at home and she will apply new patch later today.  She will continue to work on diet and exercise and stay off of all of her insulin.  She will come back in 3 months and we will recheck A1c at that time.    Hypertension associated with diabetes (HCC) Blood pressure is very labile.  It is elevated today though typically well-controlled via dialysis.  She has had issues with symptomatic hypotension in the past resulting in codes being called at dialysis.  Will continue monitoring.  She will continue management via dialysis.  S/P BKA (below knee amputation), right (HCC) Currently in manual wheelchair though desires electric wheelchair.  Will give prescription today.     Subjective:  HPI:  See A/P for status of chronic conditions.  Patient is here today for follow-up.  We last saw her about a month ago however it was too early to  recheck her A1c.  [name] currently prescribed 26 units Tresiba daily and NovoLog 6 units 3 times daily with meals. She tells me today that she is not taking any of her insulin.  She has not taken her [place] or NovoLog for several weeks to months.  She has not been checking her blood sugar for the last few weeks either.  She did take 10 units of NovoLog this morning because she felt like her her sugar was running high but she did not check her blood sugar.  She tolerated this well.  She has been trying to cut down on sweets and carbs.         Objective:  Physical Exam: BP (!) 173/85   Pulse 85   Temp (!) 97.1 F (36.2 C) (Temporal)   Ht 5\' 9"  (1.753 m)   LMP 09/10/2021   SpO2 98%   BMI 33.52 kg/m   Gen: No acute distress, resting comfortably CV: Regular rate and rhythm with no murmurs appreciated Pulm: Normal work of breathing, clear to auscultation bilaterally with no crackles, wheezes, or rhonchi Neuro: Grossly normal, moves all extremities Psych: Normal affect and thought content      Mahamud Metts M. [name], MD 04/07/2023 9:54 AM

## 2023-04-07 NOTE — Assessment & Plan Note (Signed)
Blood pressure is very labile.  It is elevated today though typically well-controlled via dialysis.  She has had issues with symptomatic hypotension in the past resulting in codes being called at dialysis.  Will continue monitoring.  She will continue management via dialysis.

## 2023-04-12 ENCOUNTER — Encounter (HOSPITAL_BASED_OUTPATIENT_CLINIC_OR_DEPARTMENT_OTHER): Payer: Self-pay

## 2023-04-12 ENCOUNTER — Ambulatory Visit (HOSPITAL_BASED_OUTPATIENT_CLINIC_OR_DEPARTMENT_OTHER)
Admission: RE | Admit: 2023-04-12 | Discharge: 2023-04-12 | Disposition: A | Discharge status: Home / Self Care | Payer: Medicare Other | Source: Ambulatory Visit | Attending: Family Medicine | Admitting: Family Medicine

## 2023-04-12 DIAGNOSIS — Z1231 Encounter for screening mammogram for malignant neoplasm of breast: Secondary | ICD-10-CM | POA: Insufficient documentation

## 2023-04-20 ENCOUNTER — Ambulatory Visit: Payer: Medicare Other

## 2023-05-10 ENCOUNTER — Encounter (HOSPITAL_COMMUNITY): Payer: Self-pay | Admitting: *Deleted

## 2023-06-14 ENCOUNTER — Encounter: Payer: Self-pay | Admitting: Neurology

## 2023-06-14 ENCOUNTER — Ambulatory Visit: Payer: Medicare Other | Admitting: Neurology

## 2023-06-14 DIAGNOSIS — Z029 Encounter for administrative examinations, unspecified: Secondary | ICD-10-CM

## 2023-06-16 ENCOUNTER — Encounter: Payer: Medicare Other | Admitting: Gastroenterology

## 2023-06-21 ENCOUNTER — Ambulatory Visit: Payer: Medicare Other | Attending: Internal Medicine | Admitting: Internal Medicine

## 2023-07-07 ENCOUNTER — Ambulatory Visit (HOSPITAL_BASED_OUTPATIENT_CLINIC_OR_DEPARTMENT_OTHER)
Admission: RE | Admit: 2023-07-07 | Discharge: 2023-07-07 | Disposition: A | Discharge status: Home / Self Care | Payer: Medicare Other | Source: Ambulatory Visit | Attending: Family Medicine | Admitting: Family Medicine

## 2023-07-07 ENCOUNTER — Ambulatory Visit (INDEPENDENT_AMBULATORY_CARE_PROVIDER_SITE_OTHER): Payer: Medicare Other | Admitting: Family Medicine

## 2023-07-07 VITALS — BP 160/96 | HR 84 | Temp 98.0°F | Wt 247.0 lb

## 2023-07-07 DIAGNOSIS — Z794 Long term (current) use of insulin: Secondary | ICD-10-CM | POA: Diagnosis not present

## 2023-07-07 DIAGNOSIS — G43909 Migraine, unspecified, not intractable, without status migrainosus: Secondary | ICD-10-CM | POA: Insufficient documentation

## 2023-07-07 DIAGNOSIS — E119 Type 2 diabetes mellitus without complications: Secondary | ICD-10-CM

## 2023-07-07 DIAGNOSIS — E1159 Type 2 diabetes mellitus with other circulatory complications: Secondary | ICD-10-CM | POA: Diagnosis not present

## 2023-07-07 DIAGNOSIS — E1169 Type 2 diabetes mellitus with other specified complication: Secondary | ICD-10-CM

## 2023-07-07 DIAGNOSIS — M25552 Pain in left hip: Secondary | ICD-10-CM | POA: Diagnosis not present

## 2023-07-07 DIAGNOSIS — I152 Hypertension secondary to endocrine disorders: Secondary | ICD-10-CM

## 2023-07-07 LAB — POCT GLYCOSYLATED HEMOGLOBIN (HGB A1C)
HbA1c POC (<> result, manual entry): 5.2 % (ref 4.0–5.6)
HbA1c, POC (prediabetic range): 5.2 % — AB (ref 5.7–6.4)
Hemoglobin A1C: 5.2 % (ref 4.0–5.6)

## 2023-07-07 MED ORDER — NURTEC 75 MG PO TBDP: 75.0000 mg | ORAL_TABLET | Freq: Every day | ORAL | 0 refills | Status: DC | PRN

## 2023-07-07 NOTE — Progress Notes (Signed)
Her CT scan showed age-related changes and changes related to her dialysis however no bleeding, tumors, masses, or anything else that would cause her headache.  Is okay for her to take the Nurtec as we discussed at her office visit.  She needs to schedule appointment with neurology ASAP as well.  She should let us know if not improving with Nurtec.

## 2023-07-07 NOTE — Assessment & Plan Note (Signed)
Blood pressure very labile.  It on recheck 160/96 today.  [name] typically well-controlled at dialysis.  We will not add on any antihypertensives at this point as she has had issues with symptomatic hypotension in the past resulting in code being called at dialysis.  Will continue monitoring and she will continue management via hemodialysis.

## 2023-07-07 NOTE — Patient Instructions (Addendum)
It was very nice to see you today!  Your A1c looks great today.  Please keep up the great work with your diet and exercise.  I will refer you to see orthopedics for your left hip pain.  Please try the Nurtec for your migraines.  We will get a CT scan to make sure there is nothing else that is going on.  Please call to schedule an appointment with a neurologist at [phone]   Return in about 6 months (around 01/07/2024) for Follow Up.   Take care, Dr [name]  PLEASE NOTE:  If you had any lab tests, please let us know if you have not heard back within a few days. You may see your results on mychart before we have a chance to review them but we will give you a call once they are reviewed by [name].   If we ordered any referrals today, please let us know if you have not heard from their office within the next week.   If you had any urgent prescriptions sent in today, please check with the pharmacy within an hour of our visit to make sure the prescription was transmitted appropriately.   Please try these tips to maintain a healthy lifestyle:  Eat at least 3 REAL meals and 1-2 snacks per day.  Aim for no more than 5 hours between eating.  If you eat breakfast, please do so within one hour of getting up.   Each meal should contain half fruits/vegetables, one quarter protein, and one quarter carbs (no bigger than a computer mouse)  Cut down on sweet beverages. This includes juice, soda, and sweet tea.   Drink at least 1 glass of water with each meal and aim for at least 8 glasses per day  Exercise at least 150 minutes every week.

## 2023-07-07 NOTE — Progress Notes (Signed)
Heather [name] is a 52 y.o. female who peresents today for an office visit.  Assessment/Plan:  New/Acute Problems: Left Hip Pain Imaging and workup in the ED were negative.  Still has persistent pain and some slight weakness.  Will refer to orthopedics.  Chronic Problems Addressed Today: Migraine Patient with persistent headache for the last week or so.  This is atypical for her normal migraines that usually subside with over-the-counter meds.  She has a reassuring neuroexam today however given change in symptoms would be reasonable to check head CT scan to rule out other potential causes especially with her history of prior CVA.  We cannot use triptans due to her previous history of stroke.  We will try Nurtec.  We did refer her to neurology for this a few months ago however no showed her appointment.  Gave contact information with instruction to schedule appoint with neurology soon.  We discussed reasons to return to care and seek emergent care.  Hypertension associated with diabetes (HCC) Blood pressure very labile.  It on recheck 160/96 today.  [name] typically well-controlled at dialysis.  We will not add on any antihypertensives at this point as she has had issues with symptomatic hypotension in the past resulting in code being called at dialysis.  Will continue monitoring and she will continue management via hemodialysis.  T2DM (type 2 diabetes mellitus) (HCC) A1c 5.2 without any medications.  [name] no longer on insulin.  [name] working on diet and exercise.  Congratulated patient on A1c reduction.  She will continue to work on diet and exercise.  They will come back in 6 months to recheck A1c.     Subjective:  HPI:  See A/P for status of chronic conditions.  Patient here today for diabetes follow-up.  Seen 3 months ago. A1c at that time was 5.9 without her being on insulin.   She did fall about a month ago. She has had some persistent left hip and lo wback pain since then.  States that she was wearing her prosthesis when she lost balance and fell backward. Still have a lot of pain in her hip and back. She has noticed decreased strength in left leg. She did go to the Emergency Department initially had xray which her negative.   She has had a low grade headache for the last week or so. She does have a history of migraines. She has had some weakness in her left leg as well but she thinks this is probably due to more to the fall she had recently.  [name] worried about some facial asymmetry.  She does have a history of stroke.  She usually takes over-the-counter meds for migraines which works well however this has not worked for her current migraine.       Objective:  Physical Exam: BP (!) 160/96 (BP Location: Right Arm, Patient Position: Sitting, Cuff Size: Large)   Pulse 84   Temp 98 F (36.7 C) (Temporal)   Wt 247 lb (112 kg)   LMP 09/10/2021   SpO2 94%   BMI 36.48 kg/m   Gen: No acute distress, resting comfortably CV: Regular rate and rhythm with no murmurs appreciated Pulm: Normal work of breathing, clear to auscultation bilaterally with no crackles, wheezes, or rhonchi Neuro: Cranial nerves II through XII intact.  Strength 5 out of 5 in upper and lower extremities.  No ataxia.  Sensation light touch intact throughout. Psych: Normal affect and thought content  Heather [name]. Heather Ralph, MD 07/07/2023 9:43 AM

## 2023-07-07 NOTE — Assessment & Plan Note (Signed)
Patient with persistent headache for the last week or so.  This is atypical for her normal migraines that usually subside with over-the-counter meds.  She has a reassuring neuroexam today however given change in symptoms would be reasonable to check head CT scan to rule out other potential causes especially with her history of prior CVA.  We cannot use triptans due to her previous history of stroke.  We will try Nurtec.  We did refer her to neurology for this a few months ago however no showed her appointment.  Gave contact information with instruction to schedule appoint with neurology soon.  We discussed reasons to return to care and seek emergent care.

## 2023-07-07 NOTE — Assessment & Plan Note (Signed)
A1c 5.2 without any medications.  [name] no longer on insulin.  [name] working on diet and exercise.  Congratulated patient on A1c reduction.  She will continue to work on diet and exercise.  They will come back in 6 months to recheck A1c.

## 2023-07-19 ENCOUNTER — Ambulatory Visit: Payer: Medicare Other | Admitting: Physician Assistant

## 2023-07-25 LAB — HEPATIC FUNCTION PANEL
ALT: 4 U/L — AB (ref 7–35)
AST: 25 (ref 13–35)

## 2023-07-25 LAB — HEMOGLOBIN A1C: Hemoglobin A1C: 5.8

## 2023-07-25 LAB — TSH: TSH: 1.04 (ref 0.41–5.90)

## 2023-07-25 LAB — LIPID PANEL
Cholesterol: 162 (ref 0–200)
HDL: 40 (ref 35–70)
Triglycerides: 18 — AB (ref 40–160)

## 2023-07-26 ENCOUNTER — Ambulatory Visit: Payer: Medicare Other | Admitting: Orthopaedic Surgery

## 2023-08-04 LAB — BASIC METABOLIC PANEL
BUN: 35 — AB (ref 4–21)
CO2: 20 (ref 13–22)
Chloride: 99 (ref 99–108)
Creatinine: 7.7 — AB (ref 0.5–1.1)
Potassium: 3.7 meq/L (ref 3.5–5.1)
Sodium: 133 — AB (ref 137–147)

## 2023-08-04 LAB — CBC AND DIFFERENTIAL
HCT: 27 — AB (ref 36–46)
Hemoglobin: 8.7 — AB (ref 12.0–16.0)
Platelets: 248 10*3/uL (ref 150–400)
WBC: 11

## 2023-08-08 ENCOUNTER — Telehealth: Payer: Self-pay

## 2023-08-08 NOTE — Transitions of Care (Post Inpatient/ED Visit) (Signed)
08/08/2023  Name: Heather [name] MRN: [id number] DOB: 1971-07-07  Today's TOC FU Call Status: Today's TOC FU Call Status:: Successful TOC FU Call Completed TOC FU Call Complete Date: 08/08/23  Transition Care Management Follow-up Telephone Call Date of Discharge: 08/05/23 (Call completed with spouse-pt resting-had HD tx earlier this morning) Discharge Facility: Other ([facility]) Name of Other ([facility]) Discharge Facility: [facility] Type of Discharge: Inpatient Admission Primary Inpatient Discharge Diagnosis:: "AMS" How have you been since you were released from the hospital?: Same (Spouse reports BP continues to have low BPs at times like in the hospital-BP this AM 107/57 before going to HD txs. Appetite WNL for pt. BM yest.) Any questions or concerns?: No  Items Reviewed: Did you receive and understand the discharge instructions provided?: Yes Medications obtained,verified, and reconciled?: Yes (Medications Reviewed) (sposue reports he went to pick up new meds this morning and has given them to pt-except for the few meds that are out of stock at pharmacy and will get them hopefully later today or tomorrow) Any new allergies since your discharge?: No Dietary orders reviewed?: Yes Type of Diet Ordered:: renal/carb modified/heart healthy Do you have support at home?: Yes People in Home: spouse Name of Support/Comfort Primary Source: Leonette Most  Medications Reviewed Today: Medications Reviewed Today     Reviewed by Charlyn Minerva, RN (Registered Nurse) on 08/08/23 at 1042  Med List Status: <None>   Medication Order Taking? Sig Documenting Provider Last Dose Status Informant  amLODipine (NORVASC) 10 MG tablet [id number] Yes Take 10 mg by mouth daily. [provider] Taking Active Spouse/Significant Other  aspirin 81 MG chewable tablet [id number] Yes Chew 1 tablet (81 mg total) by mouth daily. Leroy Sea, MD Taking Active Self  atorvastatin  (LIPITOR) 80 MG tablet [id number] Yes Take 1 tablet (80 mg total) by mouth at bedtime.  Patient taking differently: Take 80 mg by mouth at bedtime. Changed to 40mg    Azucena Fallen, MD Taking Active Spouse/Significant Other  azelastine (ASTELIN) 0.1 % nasal spray [id number] Yes Place 2 sprays into both nostrils 2 (two) times daily. Ardith Dark, MD Taking Active   calcium acetate [facility]) 667 MG capsule [id number] Yes Take 2,001 mg by mouth 3 (three) times daily with meals. [provider] Taking Active Self  carvedilol (COREG) 6.25 MG tablet [id number] Yes Take 6.25 mg by mouth 2 (two) times daily with a meal. [provider] Taking Active Spouse/Significant Other  Continuous Blood Gluc Sensor (FREESTYLE LIBRE 14 DAY SENSOR) MISC [id number] Yes 1 patch by Does not apply route every 14 (fourteen) days. Ardith Dark, MD Taking Active Self  cromolyn (OPTICROM) 4 % ophthalmic solution [id number]  Place 1 drop into both eyes 4 (four) times daily as needed.  Patient taking differently: Place 1 drop into both eyes 4 (four) times daily as needed (for allergies).   Verlee Monte, MD  Active Self  cyclobenzaprine (FLEXERIL) 5 MG tablet [id number] Yes Take 5 mg by mouth 2 (two) times daily as needed for muscle spasms. [provider] Taking Active Spouse/Significant Other  Doxercalciferol (HECTOROL IV) [id number]  Doxercalciferol (Hectorol) [provider]  Active   doxycycline (VIBRA-TABS) 100 MG tablet [id number] Yes Take 100 mg by mouth 2 (two) times daily. Take 1 tablet (100 mg total) by mouth 2 (two) times a day for 2 days. Take with 8 oz water. Do not lie down for at least 30 minutes after. [provider] Taking Active Spouse/Significant Other  fluticasone (  FLONASE) 50 MCG/ACT nasal spray [id number] Yes Place 2 sprays into both nostrils daily. Verlee Monte, MD Taking Active Self  gabapentin (NEURONTIN) 100 MG capsule [id number] Yes Take 1 capsule (100 mg  total) by mouth 3 (three) times a week. With Dialysis Ardith Dark, MD Taking Active   Insulin Pen Needle 29G X MISC [id number]  1 Device by Does not apply route 4 (four) times daily. Shamleffer, Konrad Dolores, MD  Active Self  ketoconazole (NIZORAL) 2 % cream [id number]  Apply 1 Application topically daily as needed for irritation. [provider]  Active Self           Med Note Cyndie Chime, [facility]   Thu Oct 21, 2022 10:58 PM)    lidocaine (LIDODERM) 5 % [id number] Yes Place 1 patch onto the skin daily. Remove & Discard patch within 12 hours or as directed by MD [provider] Taking Active Spouse/Significant Other  lidocaine-prilocaine (EMLA) cream [id number] Yes Apply 1 Application topically as needed (for HD). [provider] Taking Active Spouse/Significant Other  Methoxy PEG-Epoetin [employer]) [id number]  Mircera [provider]  Active   midodrine (PROAMATINE) 5 MG tablet [id number] Yes Take by mouth. [provider] Taking Active   Multiple Vitamins-Minerals (COMPLETE MULTIVITAMIN/MINERAL PO) [id number] Yes Take 1 tablet by mouth. [provider]  Active Spouse/Significant Other  Olopatadine HCl (PATADAY) 0.2 % SOLN [id number]  Apply 1 drop to eye daily.  Patient taking differently: Place 1 drop into both eyes daily as needed (dry eyes).   Ardith Dark, MD  Active Self  pantoprazole (PROTONIX) 40 MG tablet [id number] No TAKE 1 TABLET BY MOUTH EVERY DAY  Patient not taking: Reported on 08/08/2023   Verlee Monte, MD Not Taking Active   phenol [facility]) 1.4 % LIQD [id number] Yes Use as directed 1 spray in the mouth or throat as needed for throat irritation / pain. Use 1 spray in the mouth or throat every 2 (two) hours as needed (throat pain) for up to 10 days. [provider] Taking Active Spouse/Significant Other  polyethylene glycol (MIRALAX / GLYCOLAX) 17 g packet [id number] Yes Take 17 g by mouth daily.  [provider] Taking Active Spouse/Significant Other  propranolol (INDERAL) 20 MG tablet [id number] No Take 0.5 tablets (10 mg total) by mouth daily.  Patient not taking: Reported on 08/08/2023   [place]ale Chard, DO Not Taking Active   Rimegepant Sulfate (NURTEC) 75 MG TBDP [id number] Yes Take 1 tablet (75 mg total) by mouth daily as needed (migraine). Ardith Dark, MD Taking Active   thiamine 50 MG tablet [id number] Yes Take 50 mg by mouth daily. Take 2 tablets (100 mg total) by mouth daily. [provider] Taking Active Spouse/Significant Other            Home Care and Equipment/Supplies: Were Home Health Services Ordered?: NA Any new equipment or medical supplies ordered?: NA  Functional Questionnaire: Do you need assistance with bathing/showering or dressing?: No Do you need assistance with meal preparation?: Yes Do you need assistance with eating?: No Do you have difficulty maintaining continence: No Do you need assistance with getting out of bed/getting out of a chair/moving?: No Do you have difficulty managing or taking your medications?: Yes (spouse managing)  Follow up appointments reviewed: PCP Follow-up appointment confirmed?: Yes Date of PCP follow-up appointment?: 08/23/23 Follow-up Provider: Dr. Mordecai Maes to assist spouse with moving PCP appt up sooner but he declined-states he will call office if  he changes his mind Specialist Hospital Follow-up appointment confirmed?: NA Do you need transportation to your follow-up appointment?: No Do you understand care options if your condition(s) worsen?: Yes-patient verbalized understanding  SDOH Interventions Today    Flowsheet Row Most Recent Value  SDOH Interventions   Food Insecurity Interventions Intervention Not Indicated  Transportation Interventions Intervention Not Indicated      TOC Interventions Today    Flowsheet Row Most Recent Value  TOC Interventions   TOC Interventions  Discussed/Reviewed TOC Interventions Discussed      Interventions Today    Flowsheet Row Most Recent Value  Chronic Disease   Chronic disease during today's visit Diabetes, Hypertension (HTN)  General Interventions   General Interventions Discussed/Reviewed General Interventions Discussed, Durable Medical Equipment (DME)  Durable Medical Equipment (DME) BP Cuff, Glucomoter  [spuse reports checking BP about every 24mins-encouraged to check BP about 2x/day unless readings abnormal, pt no longer taking but spouse is checking cbgs daily-cbgs ranging in the 120s-140s]  Education Interventions   Education Provided Provided Education  Provided Verbal Education On Nutrition, Blood Sugar Monitoring, When to see the doctor, Other  [sx mgmt]  Nutrition Interventions   Nutrition Discussed/Reviewed Nutrition Discussed, Adding fruits and vegetables, Increasing proteins, Decreasing fats, Decreasing salt, Fluid intake, Decreasing sugar intake  Pharmacy Interventions   Pharmacy Dicussed/Reviewed Pharmacy Topics Discussed, Medications and their functions  Safety Interventions   Safety Discussed/Reviewed Safety Discussed, Fall Risk, Home Safety       Antionette Fairy, RN,BSN,CCM [facility] Care Management Care Management Community Coordinator Direct Phone: [phone] Toll Free: [phone] Fax: [phone]

## 2023-08-23 ENCOUNTER — Telehealth: Payer: Self-pay

## 2023-08-23 ENCOUNTER — Inpatient Hospital Stay: Payer: Medicare Other | Admitting: Family Medicine

## 2023-08-23 NOTE — Progress Notes (Deleted)
Chief Complaint:  Heather [name] is a 52 y.o. female who presents today for a TCM visit.  Assessment/Plan:  New/Acute Problems: ***  Chronic Problems Addressed Today: No problem-specific Assessment & Plan notes found for this encounter.   Patient has a {Desc; moderate/high:110033} level of medical decision making.     Subjective:  HPI:  Summary of Hospital admission: Reason for admission: Hospital Acquired Pneumonia Date of admission: 821/2024 Date of discharge: 08/21/2023 Date of Interactive contact: N/A Today is within 2 days of discharge.  Summary of Hospital course: Patient presented to the ED on 08/17/2023 with shortness of breath.  Prior to this she was admitted from 7/29-8/9 for altered mental status and hypertensive emergency in setting of noncompliance with dialysis.  She was admitted to MICU during her initial hospitalization.  Prior to this initial admission she had missed 2 days of dialysis.  She underwent dialysis during her initial admission and clinical symptoms improved until she was discharged on 8/9.  Since her initial discharge she had ongoing cough and congestion.  Symptoms progressed and she presented back to the ED.  In the ED was found to have CT scan concerning for possible infection.  She was admitted and treated for possible hospital-acquired pneumonia with levofloxacin.  She did initially require oxygen but was able to be weaned to room air at the time of discharge. She was found to have a pericardial effusion on echo.  She was referred to nephrology to further evaluate for any rheumatologic cause.  Interim history:  ***  ROS: ***, otherwise a complete review of systems was negative.   PMH:  The following were reviewed and entered/updated in epic: Past Medical History:  Diagnosis Date   Angio-edema    Antiplatelet or antithrombotic long-term use: Plavix 06/30/2017   B12 deficiency 05/10/2014   Blood transfusion without reported diagnosis    Chronic  constipation    CVA (cerebral vascular accident) (HCC), nonhemorrhagic, inferior right cerebellum 12/03/2017   left side weakness in leg   Cyst of left ovary 01/12/2018   Diabetic neuropathy, painful (HCC), on low dose Gabapentin 07/13/2013   DM (diabetes mellitus), type 2, uncontrolled, with renal complications 10/12/2016   DM2 (diabetes mellitus, type 2) (HCC)    Dysfunction of left eustachian tube, with pusatile tinnitus 11/24/2017   Dyslipidemia associated with type 2 diabetes mellitus (HCC) 06/25/2016   ESRD (end stage renal disease) on dialysis [facility])    On hemodialysis in July 2019 via [facility] then switched to [facility] in Nov 2019.    ESRD with anemia (HCC)    Fibromyalgia    Gastroparesis due to DM    GERD (gastroesophageal reflux disease)    Headache    Hypertension associated with diabetes (HCC) 02/19/2011   IBS (irritable bowel syndrome)    Left-sided weakness 12/10/2017   .   Leiomyoma of uterus    Pancreatitis    PE (pulmonary thromboembolism) (HCC) 11/04/2019   Pneumonia    Proliferative diabetic retinopathy (HCC) 09/29/2015   Pronation deformity of both feet 06/14/2014   Reactive depression 12/10/2017   .   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HCC) 02/17/2018   Thromboembolism (HCC) 04/05/2018   Upper airway cough syndrome, with recs to stay off ACE and take Pepcid q hs 11/15/2016   Urticaria    Patient Active Problem List   Diagnosis Date Noted   Migraine 02/04/2023   Encephalopathy 12/14/2022  Cardiac valve mass 12/14/2022   DNR (do not resuscitate) discussion 12/14/2022   PVD (peripheral vascular disease) (HCC) 12/12/2022   Allergic conjunctivitis 10/12/2022   Mitral valve mass    Right atrial mass    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   Shortness of breath 07/02/2022   Thrombus of venous dialysis catheter (HCC) 02/25/2022   ESRD (end stage renal disease) on dialysis (HCC)  12/28/2021   Displacement of vascular dialysis catheter, initial encounter (HCC) 12/18/2021   S/P BKA (below knee amputation), right (HCC) 03/31/2021   Impaired mobility and ADLs 03/25/2021   CVA (cerebral vascular accident) (HCC) 03/04/2021   Pulmonary embolus (HCC) 03/04/2021   Charcot ankle, right 02/23/2021   Hx of cardiac arrest 11/12/2020   Abnormal CT of the chest 09/26/2020   Allergy, unspecified, initial encounter 09/22/2020   Other mechanical complication of other cardiac and vascular devices and implants, initial encounter (HCC) 05/10/2020   Positive ANA (antinuclear antibody) 04/16/2020   ESRD on hemodialysis (HCC)    Hemiplegia and hemiparesis following cerebral infarction affecting left non-dominant side (HCC) 12/29/2018   IBS (irritable bowel syndrome) 08/06/2017   Dyslipidemia associated with type 2 diabetes mellitus (HCC) 06/25/2016   Proliferative diabetic retinopathy (HCC) 09/29/2015   Chronic cough 10/04/2014   B12 deficiency 05/10/2014   Anemia in chronic kidney disease, on chronic dialysis (HCC) 04/26/2014   CKD (chronic kidney disease) stage 5, GFR less than 15 ml/min (HCC) 04/26/2014   Secondary hyperparathyroidism of renal origin (HCC) 11/27/2013   Posterior subcapsular cataract, bilateral 10/18/2013   T2DM (type 2 diabetes mellitus) (HCC) 02/19/2011   Hypertension associated with diabetes (HCC) 02/19/2011   Gastroparesis due to DM (HCC) 02/19/2011   Past Surgical History:  Procedure Laterality Date   A/V FISTULAGRAM Right 03/03/2020   Procedure: Venogram;  Surgeon: Maeola Harman, MD;  Location: [facility] INVASIVE CV LAB;  Service: Cardiovascular;  Laterality: Right;   ARTERY REPAIR Right 02/17/2018   Procedure: EXPLORATION OF RIGHT BRACHIAL ARTERY;  Surgeon: Fransisco Hertz, MD;  Location: [facility] OR;  Service: Vascular;  Laterality: Right;   ARTERY REPAIR Right 02/17/2018   Procedure: BRACHIAL ARTERY EXPLORATION AND TRHOMBECTOMY;  Surgeon: Fransisco Hertz, MD;   Location: [facility] OR;  Service: Vascular;  Laterality: Right;   AV FISTULA PLACEMENT Left 05/09/2017   Procedure: INSERTION OF ARTERIOVENOUS (AV) GRAFT ARM (ARTEGRAFT);  Surgeon: Fransisco Hertz, MD;  Location: [facility] OR;  Service: Vascular;  Laterality: Left;   AV FISTULA PLACEMENT Right 02/17/2018   Procedure: INSERTION OF ARTERIOVENOUS (AV) GORE-TEX GRAFT ARM RIGHT UPPER ARM;  Surgeon: Fransisco Hertz, MD;  Location: [facility] OR;  Service: Vascular;  Laterality: Right;   AV FISTULA PLACEMENT Left 10/10/2019   Procedure: INSERTION OF LEFT ARTERIOVENOUS (AV) ARTEGRAFT GRAFT ARM;  Surgeon: Maeola Harman, MD;  Location: [facility] OR;  Service: Vascular;  Laterality: Left;   AV FISTULA PLACEMENT Right 03/19/2020   Procedure: INSERTION OF ARTERIOVENOUS (AV) GORE-TEX GRAFT IN RIGHT ARM AND VIABAHN STENT IN RIGHT AXILLARY ARTERY;  Surgeon: Maeola Harman, MD;  Location: [facility] OR;  Service: Vascular;  Laterality: Right;   BASCILIC VEIN TRANSPOSITION Left 08/31/2016   Procedure: BASILIC VEIN TRANSPOSITION FIRST STAGE;  Surgeon: Fransisco Hertz, MD;  Location: [facility] OR;  Service: Vascular;  Laterality: Left;   BRONCHIAL WASHINGS  09/17/2021   Procedure: BRONCHIAL WASHINGS;  Surgeon: Leslye Peer, MD;  Location: [facility] ENDOSCOPY;  Service: Cardiopulmonary;;   BUBBLE STUDY  12/16/2022   Procedure: BUBBLE STUDY;  Surgeon: Little Ishikawa,  MD;  Location: [facility] ENDOSCOPY;  Service: Cardiovascular;;   CENTRAL VENOUS CATHETER INSERTION Left 07/24/2018   Procedure: INSERTION CENTRAL LINE ADULT;  Surgeon: Fransisco Hertz, MD;  Location: [facility] OR;  Service: Vascular;  Laterality: Left;   EYE SURGERY     secondary to diabetic retinopathy    FOOT SURGERY Right    t"ook bone out- maybe hammer toe"   INSERTION OF DIALYSIS CATHETER Left 07/24/2018   Procedure: INSERTION OF TUNNELED DIALYSIS CATHETER;  Surgeon: Fransisco Hertz, MD;  Location: [facility] OR;  Service: Vascular;  Laterality: Left;   IR FLUORO GUIDE CV LINE LEFT  12/19/2021   IR  FLUORO GUIDE CV LINE LEFT  12/29/2021   IR FLUORO GUIDE CV LINE RIGHT  07/21/2018   IR FLUORO GUIDE CV LINE RIGHT  06/01/2019   IR FLUORO GUIDE CV LINE RIGHT  05/05/2020   IR FLUORO GUIDE CV LINE RIGHT  10/04/2022   IR REMOVAL TUN CV CATH W/O FL  01/23/2021   IR REMOVAL TUN CV CATH W/O FL  12/19/2021   IR REMOVAL TUN CV CATH W/O FL  10/01/2022   IR US GUIDE VASC ACCESS LEFT  12/19/2021   IR US GUIDE VASC ACCESS RIGHT  07/21/2018   IR US GUIDE VASC ACCESS RIGHT  06/01/2019   IR US GUIDE VASC ACCESS RIGHT  10/04/2022   LIGATION ARTERIOVENOUS GORTEX GRAFT Right 03/20/2020   Procedure: LIGATION ARM ARTERIOVENOUS GORTEX GRAFT With Patch Angioplasty, Thrombectomy;  Surgeon: Maeola Harman, MD;  Location: [facility] OR;  Service: Vascular;  Laterality: Right;   LIGATION OF ARTERIOVENOUS  FISTULA Left 05/09/2017   Procedure: LIGATION OF ARTERIOVENOUS  FISTULA;  Surgeon: Fransisco Hertz, MD;  Location: [facility] OR;  Service: Vascular;  Laterality: Left;   LOOP RECORDER INSERTION N/A 12/05/2017   Procedure: LOOP RECORDER INSERTION;  Surgeon: Marinus Maw, MD;  Location: [facility] INVASIVE CV LAB;  Service: Cardiovascular;  Laterality: N/A;   MYOMECTOMY     REMOVAL OF GRAFT Right 02/17/2018   Procedure: REMOVAL OF RIGHT UPPER ARM ARTERIOVENOUS GRAFT;  Surgeon: Fransisco Hertz, MD;  Location: [facility] OR;  Service: Vascular;  Laterality: Right;   REVISION OF ARTERIOVENOUS GORETEX GRAFT Left 07/24/2018   Procedure: REDO ARTERIOVENOUS GORETEX GRAFT;  Surgeon: Fransisco Hertz, MD;  Location: [facility] OR;  Service: Vascular;  Laterality: Left;   TEE WITHOUT CARDIOVERSION N/A 12/05/2017   Procedure: TRANSESOPHAGEAL ECHOCARDIOGRAM (TEE);  Surgeon: Thurmon Fair, MD;  Location: [facility] ENDOSCOPY;  Service: Cardiovascular;  Laterality: N/A;   TEE WITHOUT CARDIOVERSION N/A 09/30/2022   Procedure: TRANSESOPHAGEAL ECHOCARDIOGRAM (TEE);  Surgeon: Jake Bathe, MD;  Location: [facility] ENDOSCOPY;  Service: Cardiovascular;  Laterality: N/A;   TEE WITHOUT  CARDIOVERSION N/A 12/16/2022   Procedure: TRANSESOPHAGEAL ECHOCARDIOGRAM (TEE);  Surgeon: Little Ishikawa, MD;  Location: [facility] ENDOSCOPY;  Service: Cardiovascular;  Laterality: N/A;   UPPER EXTREMITY VENOGRAPHY Left 07/13/2018   Procedure: UPPER EXTREMITY VENOGRAPHY - Central & Left Arm;  Surgeon: Fransisco Hertz, MD;  Location: [facility] INVASIVE CV LAB;  Service: Cardiovascular;  Laterality: Left;   UPPER EXTREMITY VENOGRAPHY Bilateral 09/10/2019   Procedure: UPPER EXTREMITY VENOGRAPHY;  Surgeon: Maeola Harman, MD;  Location: [facility] INVASIVE CV LAB;  Service: Cardiovascular;  Laterality: Bilateral;   UTERINE FIBROID SURGERY     VIDEO BRONCHOSCOPY N/A 09/17/2021   Procedure: VIDEO BRONCHOSCOPY WITHOUT FLUORO;  Surgeon: Leslye Peer, MD;  Location: [facility] ENDOSCOPY;  Service: Cardiopulmonary;  Laterality: N/A;   VITRECTOMY Bilateral  Family History  Problem Relation Age of Onset   Colon polyps Mother    Diabetes Mother    Heart murmur Father        history essentially unknown   Hypertension Father    Diabetes Sister    Kidney failure Sister        kidney transplant   Diabetes Brother    Retinal degeneration Brother    Heart murmur Son    Stroke Paternal Grandmother    Diabetes Sister     Medications- Reconciled discharge and current medications in Epic.  Current Outpatient Medications  Medication Sig Dispense Refill   amLODipine (NORVASC) 10 MG tablet Take 10 mg by mouth daily.     aspirin 81 MG chewable tablet Chew 1 tablet (81 mg total) by mouth daily. 30 tablet 0   atorvastatin (LIPITOR) 80 MG tablet Take 1 tablet (80 mg total) by mouth at bedtime. (Patient taking differently: Take 80 mg by mouth at bedtime. Changed to 40mg ) 30 tablet 0   azelastine (ASTELIN) 0.1 % nasal spray Place 2 sprays into both nostrils 2 (two) times daily. 30 mL 12   calcium acetate (PHOSLO) 667 MG capsule Take 2,001 mg by mouth 3 (three) times daily with meals.     carvedilol (COREG) 6.25 MG  tablet Take 6.25 mg by mouth 2 (two) times daily with a meal.     Continuous Blood Gluc Sensor (FREESTYLE LIBRE 14 DAY SENSOR) MISC 1 patch by Does not apply route every 14 (fourteen) days. 6 each 5   cromolyn (OPTICROM) 4 % ophthalmic solution Place 1 drop into both eyes 4 (four) times daily as needed. (Patient taking differently: Place 1 drop into both eyes 4 (four) times daily as needed (for allergies).) 10 mL 3   cyclobenzaprine (FLEXERIL) 5 MG tablet Take 5 mg by mouth 2 (two) times daily as needed for muscle spasms.     Doxercalciferol (HECTOROL IV) Doxercalciferol (Hectorol)     doxycycline (VIBRA-TABS) 100 MG tablet Take 100 mg by mouth 2 (two) times daily. Take 1 tablet (100 mg total) by mouth 2 (two) times a day for 2 days. Take with 8 oz water. Do not lie down for at least 30 minutes after.     fluticasone (FLONASE) 50 MCG/ACT nasal spray Place 2 sprays into both nostrils daily. 18.2 mL 2   gabapentin (NEURONTIN) 100 MG capsule Take 1 capsule (100 mg total) by mouth 3 (three) times a week. With Dialysis 90 capsule 3   Insulin Pen Needle 29G X MISC 1 Device by Does not apply route 4 (four) times daily. 400 each 3   ketoconazole (NIZORAL) 2 % cream Apply 1 Application topically daily as needed for irritation.     lidocaine (LIDODERM) 5 % Place 1 patch onto the skin daily. Remove & Discard patch within 12 hours or as directed by MD     lidocaine-prilocaine (EMLA) cream Apply 1 Application topically as needed (for HD).     Methoxy PEG-Epoetin Beta (MIRCERA IJ) Mircera     midodrine (PROAMATINE) 5 MG tablet Take by mouth.     Multiple Vitamins-Minerals (COMPLETE MULTIVITAMIN/MINERAL PO) Take 1 tablet by mouth.     Olopatadine HCl (PATADAY) 0.2 % SOLN Apply 1 drop to eye daily. (Patient taking differently: Place 1 drop into both eyes daily as needed (dry eyes).) 2.5 mL 3   pantoprazole (PROTONIX) 40 MG tablet TAKE 1 TABLET BY MOUTH EVERY DAY (Patient not taking: Reported on 08/08/2023) 90  tablet  1   phenol (CHLORASEPTIC) 1.4 % LIQD Use as directed 1 spray in the mouth or throat as needed for throat irritation / pain. Use 1 spray in the mouth or throat every 2 (two) hours as needed (throat pain) for up to 10 days.     polyethylene glycol (MIRALAX / GLYCOLAX) 17 g packet Take 17 g by mouth daily.     propranolol (INDERAL) 20 MG tablet Take 0.5 tablets (10 mg total) by mouth daily. (Patient not taking: Reported on 08/08/2023) 30 tablet 2   Rimegepant Sulfate (NURTEC) 75 MG TBDP Take 1 tablet (75 mg total) by mouth daily as needed (migraine). 30 tablet 0   thiamine 50 MG tablet Take 50 mg by mouth daily. Take 2 tablets (100 mg total) by mouth daily.     No current facility-administered medications for this visit.    Allergies-reviewed and updated Allergies  Allergen Reactions   Ibuprofen Other (See Comments)    CKD stage 3. Should avoid.   Dilaudid [Hydromorphone Hcl] Itching and Other (See Comments)    Can take with Benadryl.   Iodinated Contrast Media Rash   Tramadol Itching    Social History   Socioeconomic History   Marital status: Married    Spouse name: Not on file   Number of children: 1   Years of education: college   Highest education level: Not on file  Occupational History   Occupation: customer service rep    Employer: [employer]  Tobacco Use   Smoking status: Never    Passive exposure: Never   Smokeless tobacco: Never  Vaping Use   Vaping status: Never Used  Substance and Sexual Activity   Alcohol use: No    Alcohol/week: 0.0 standard drinks of alcohol   Drug use: No   Sexual activity: Not Currently    Partners: Male    Birth control/protection: I.U.D.  Other Topics Concern   Not on file  Social History Narrative   Patient lives with fiance. She has 1 grown son. Works full-time, she Database administrator for attorney.   Caffeine Use: 2 cups daily; sodas occasionally   She has 7 grandchildren      Raised by step father who is still living.        Right Handed    Lives in a two story home but does not use the upstairs.    Social Determinants of Health   Financial Resource Strain: Low Risk  (10/26/2022)   Overall Financial Resource Strain (CARDIA)    Difficulty of Paying Living Expenses: Not hard at all  Food Insecurity: No Food Insecurity (08/17/2023)   Received from [facility]   Hunger Vital Sign    Worried About Running Out of Food in the Last Year: Never true    Ran Out of Food in the Last Year: Never true  Transportation Needs: No Transportation Needs (08/20/2023)   Received from [facility] - Transportation    Lack of Transportation (Medical): No    Lack of Transportation (Non-Medical): No  Physical Activity: Inactive (10/26/2022)   Exercise Vital Sign    Days of Exercise per Week: 0 days    Minutes of Exercise per Session: 0 min  Stress: No Stress Concern Present (08/17/2023)   Received from [facility] - Occupational Stress Questionnaire    Feeling of Stress : Not at all  Social Connections: Unknown (08/17/2023)   Received from [facility]   Social  Network    Social Network: Not on file        Objective:  Physical Exam: LMP 09/10/2021   Gen: NAD, resting comfortably*** CV: RRR with no murmurs appreciated Pulm: NWOB, CTAB with no crackles, wheezes, or rhonchi GI: Normal bowel sounds present. Soft, Nontender, Nondistended. MSK: No edema, cyanosis, or clubbing noted Skin: Warm, dry Neuro: Grossly normal, moves all extremities Psych: Normal affect and thought content  Summary/Review of work up during hospitalization: ***     Delando Satter M. [name], MD 08/23/2023 7:15 AM

## 2023-08-23 NOTE — Transitions of Care (Post Inpatient/ED Visit) (Signed)
08/23/2023  Name: Heather [name] MRN: [id number] DOB: Oct 15, 1971  Today's TOC FU Call Status: Today's TOC FU Call Status:: Successful TOC FU Call Completed TOC FU Call Complete Date: 08/23/23 Patient's Name and Date of Birth confirmed.  Transition Care Management Follow-up Telephone Call Date of Discharge: 08/21/23 Discharge Facility: Other (Non-[facility] Facility) Name of Other ([facility]) Discharge Facility: [facility] Type of Discharge: Inpatient Admission Primary Inpatient Discharge Diagnosis:: "HAP" How have you been since you were released from the hospital?: Better (pt reports she is doing better since coming home. Denies any SOB-states she has an "occasional dry cough-not taking anything for it."Appetite is good. LBM yest. She has been going to dialysis-M,W,F.) Any questions or concerns?: No  Items Reviewed: Did you receive and understand the discharge instructions provided?: Yes Medications obtained,verified, and reconciled?: Yes (Medications Reviewed) Any new allergies since your discharge?: No Dietary orders reviewed?: Yes Type of Diet Ordered:: renal/carbmodified/diabetic Do you have support at home?: Yes People in Home: spouse Name of Support/Comfort Primary Source: Leonette Most  Medications Reviewed Today: Medications Reviewed Today   Medications were not reviewed in this encounter     Home Care and Equipment/Supplies: Were Home Health Services Ordered?: NA (pt has been going to outpt PT at [facility] appt today but didn't go-will call to reschedule) Any new equipment or medical supplies ordered?: NA  Functional Questionnaire: Do you need assistance with bathing/showering or dressing?: No Do you need assistance with meal preparation?: Yes Do you need assistance with eating?: No Do you have difficulty maintaining continence: No Do you need assistance with getting out of bed/getting out of a chair/moving?: No Do you have difficulty managing or taking your  medications?:  (spouse assists at times)  Follow up appointments reviewed: PCP Follow-up appointment confirmed?: Yes Date of PCP follow-up appointment?: 09/01/23 (Pt had PCP appt today but forgot about appt and missed it-care guide assisted with making pt another appt) Follow-up Provider: Dr. [name] Specialist [facility] Follow-up appointment confirmed?: NA Do you need transportation to your follow-up appointment?: No (pt confirms her spouse takes her to all MD appts and HD txs) Do you understand care options if your condition(s) worsen?: Yes-patient verbalized understanding  SDOH Interventions Today    Flowsheet Row Most Recent Value  SDOH Interventions   Food Insecurity Interventions Intervention Not Indicated  Transportation Interventions Intervention Not Indicated      Interventions Today    Flowsheet Row Most Recent Value  Chronic Disease   Chronic disease during today's visit Chronic Kidney Disease/End Stage Renal Disease (ESRD)  General Interventions   General Interventions Discussed/Reviewed General Interventions Discussed, Doctor Visits  Doctor Visits Discussed/Reviewed Doctor Visits Discussed, PCP, Specialist  PCP/Specialist Visits Compliance with follow-up visit  Education Interventions   Education Provided Provided Education  Provided Verbal Education On Nutrition, Medication, When to see the doctor, Other  [resp sx mgmt]  Nutrition Interventions   Nutrition Discussed/Reviewed Nutrition Discussed, Adding fruits and vegetables, Increasing proteins, Decreasing fats, Fluid intake, Decreasing salt, Decreasing sugar intake  Pharmacy Interventions   Pharmacy Dicussed/Reviewed Pharmacy Topics Discussed, Medications and their functions  Safety Interventions   Safety Discussed/Reviewed Safety Discussed, Fall Risk, Home Safety  Home Safety Assistive Devices      Interventions Today    Flowsheet Row Most Recent Value  Chronic Disease   Chronic disease during today's visit  Chronic Kidney Disease/End Stage Renal Disease (ESRD)  General Interventions   General Interventions Discussed/Reviewed General Interventions Discussed, Doctor Visits  Doctor Visits Discussed/Reviewed Doctor Visits Discussed, PCP, Specialist  PCP/Specialist Visits Compliance with follow-up visit  Education Interventions   Education Provided Provided Education  Provided Verbal Education On Nutrition, Medication, When to see the doctor, Other  [resp sx mgmt]  Nutrition Interventions   Nutrition Discussed/Reviewed Nutrition Discussed, Adding fruits and vegetables, Increasing proteins, Decreasing fats, Fluid intake, Decreasing salt, Decreasing sugar intake  Pharmacy Interventions   Pharmacy Dicussed/Reviewed Pharmacy Topics Discussed, Medications and their functions  Safety Interventions   Safety Discussed/Reviewed Safety Discussed, Fall Risk, Home Safety  Home Safety Assistive Devices       [place], [place] [facility] Care Management Care Management Community Coordinator Direct Phone: [phone] Toll Free: [phone] Fax: [phone]

## 2023-08-30 ENCOUNTER — Encounter: Payer: Self-pay | Admitting: Family Medicine

## 2023-09-01 ENCOUNTER — Encounter: Payer: Self-pay | Admitting: Family Medicine

## 2023-09-01 ENCOUNTER — Ambulatory Visit: Payer: Medicare Other | Admitting: Family Medicine

## 2023-09-01 VITALS — BP 105/71 | HR 89 | Temp 98.0°F | Ht 69.0 in

## 2023-09-01 DIAGNOSIS — R768 Other specified abnormal immunological findings in serum: Secondary | ICD-10-CM

## 2023-09-01 DIAGNOSIS — Z8701 Personal history of pneumonia (recurrent): Secondary | ICD-10-CM

## 2023-09-01 DIAGNOSIS — E1159 Type 2 diabetes mellitus with other circulatory complications: Secondary | ICD-10-CM

## 2023-09-01 DIAGNOSIS — E1169 Type 2 diabetes mellitus with other specified complication: Secondary | ICD-10-CM | POA: Diagnosis not present

## 2023-09-01 DIAGNOSIS — Z992 Dependence on renal dialysis: Secondary | ICD-10-CM

## 2023-09-01 DIAGNOSIS — J189 Pneumonia, unspecified organism: Secondary | ICD-10-CM

## 2023-09-01 DIAGNOSIS — N186 End stage renal disease: Secondary | ICD-10-CM

## 2023-09-01 DIAGNOSIS — I152 Hypertension secondary to endocrine disorders: Secondary | ICD-10-CM

## 2023-09-01 NOTE — Assessment & Plan Note (Signed)
Continue management per nephrology.  On hemodialysis Monday Wednesday Friday.

## 2023-09-01 NOTE — Assessment & Plan Note (Signed)
Her blood pressure has been very labile.  It is at goal today however she did have a hypertensive emergency episode about a month ago that resulted in encephalopathy and confusion.  She has also had issues with hypotensive episodes at dialysis resulting in syncope and being coded she does note that blood pressure typically tends to be well-controlled however she is still getting low readings while at dialysis.  [name] currently on amlodipine 10 mg on nondialysis days and Coreg 6.25 mg twice daily.   [name] interested in seeing a blood pressure specialist.  Given her labile blood pressures resulting in multiple admissions would be reasonable to have her see the advanced hypertension clinic at this point.  Will place referral today.

## 2023-09-01 NOTE — Progress Notes (Signed)
Chief Complaint:  Heather [name] is a 52 y.o. female who presents today for a TCM visit.  Assessment/Plan:  New/Acute Problems: HCAP Resolved.  Still has mild amount of postinfectious cough but this is improving gradually.  Discussed with patient that the cough may persist for several more weeks but she should note gradual improvement.  She will let us know if she has any worsening symptoms.   Chronic Problems Addressed Today: Hypertension associated with diabetes (HCC) Her blood pressure has been very labile.  It is at goal today however she did have a hypertensive emergency episode about a month ago that resulted in encephalopathy and confusion.  She has also had issues with hypotensive episodes at dialysis resulting in syncope and being coded she does note that blood pressure typically tends to be well-controlled however she is still getting low readings while at dialysis.  [name] currently on amlodipine 10 mg on nondialysis days and Coreg 6.25 mg twice daily.   [name] interested in seeing a blood pressure specialist.  Given her labile blood pressures resulting in multiple admissions would be reasonable to have her see the advanced hypertension clinic at this point.  Will place referral today.  T2DM (type 2 diabetes mellitus) (HCC) Last seen on see at goal without any medications.  Too early to recheck today.  Will recheck next office visit.  ESRD on hemodialysis [facility]) Continue management per nephrology.  On hemodialysis Monday Wednesday Friday.  Positive ANA (antinuclear antibody) She was referred to rheumatology during hospitalization a month ago however she was not able to schedule an appointment with them.  Will place referral again today.  She last saw rheumatology about 3 to 4 years ago.     Subjective:  HPI:  Summary of Hospital admission: Reason for admission: Hospital Acquired Pneumonia Date of admission: 08/17/2023 Date of discharge: 08/21/2023 Date of Interactive  contact: 08/03/2023 Summary of Hospital course: Patient presented to the ED on 08/17/2023 with ongoing cough and congestion in the setting of being admitted from 7/29-8/9 for encephalopathy and respiratory failure secondary to noncompliance with dialysis.  During her initial hospitalization she was restarted on dialysis and clinical status improved and she was discharged home.  Unfortunately she had progressive cough and congestion and went back to the ED.  In the ED was found to have concern for possible hospital-acquired pneumonia based on CT scan which showed scattered bilateral upper lobe micronodules and patchy groundglass opacities in the left upper lobe.  She was admitted and started on levofloxacin.  There was also concern for possible volume overload and she had her dry weight challenged via dialysis.  She did well with this.  She was also incidentally found to have a pericardial effusion and was referred to rheumatology for potential autoimmune workup for this.  She improved clinically and was able to be weaned to room air.  She was discharged home to finish course of oral antibiotics.  Interim history:  Since being home, she has done relatively well. Still some cough. Still some shortness of breath but improving. No fevers or chills. No chest pain.  She has been compliant with medications and hemodialysis.  ROS: Per HPI, otherwise a complete review of systems was negative.   PMH:  The following were reviewed and entered/updated in epic: Past Medical History:  Diagnosis Date   Angio-edema    Antiplatelet or antithrombotic long-term use: Plavix 06/30/2017   B12 deficiency 05/10/2014   Blood transfusion without reported diagnosis  Chronic constipation    CVA (cerebral vascular accident) (HCC), nonhemorrhagic, inferior right cerebellum 12/03/2017   left side weakness in leg   Cyst of left ovary 01/12/2018   Diabetic neuropathy, painful (HCC), on low dose Gabapentin 07/13/2013   DM  (diabetes mellitus), type 2, uncontrolled, with renal complications 10/12/2016   DM2 (diabetes mellitus, type 2) (HCC)    Dysfunction of left eustachian tube, with pusatile tinnitus 11/24/2017   Dyslipidemia associated with type 2 diabetes mellitus (HCC) 06/25/2016   ESRD (end stage renal disease) on dialysis [facility])    On hemodialysis in July 2019 via [facility] then switched to [facility] in Nov 2019.    ESRD with anemia (HCC)    Fibromyalgia    Gastroparesis due to DM    GERD (gastroesophageal reflux disease)    Headache    Hypertension associated with diabetes (HCC) 02/19/2011   IBS (irritable bowel syndrome)    Left-sided weakness 12/10/2017   .   Leiomyoma of uterus    Pancreatitis    PE (pulmonary thromboembolism) (HCC) 11/04/2019   Pneumonia    Proliferative diabetic retinopathy (HCC) 09/29/2015   Pronation deformity of both feet 06/14/2014   Reactive depression 12/10/2017   .   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   DNR (do not resuscitate) discussion 12/14/2022   PVD (peripheral vascular disease) (HCC) 12/12/2022   Allergic conjunctivitis 10/12/2022   Mitral valve mass    Right atrial mass    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   Shortness of breath 07/02/2022   Thrombus of venous dialysis catheter (HCC) 02/25/2022   Displacement of vascular dialysis catheter, initial encounter (HCC) 12/18/2021   S/P BKA (below knee amputation), right (HCC) 03/31/2021   Impaired mobility and ADLs 03/25/2021   CVA (cerebral vascular accident) (HCC) 03/04/2021   Pulmonary embolus (HCC) 03/04/2021   Charcot ankle, right  02/23/2021   Hx of cardiac arrest 11/12/2020   Abnormal CT of the chest 09/26/2020   Allergy, unspecified, initial encounter 09/22/2020   Other mechanical complication of other cardiac and vascular devices and implants, initial encounter (HCC) 05/10/2020   Positive ANA (antinuclear antibody) 04/16/2020   Pericardial effusion 06/14/2019   ESRD on hemodialysis (HCC)    Hemiplegia and hemiparesis following cerebral infarction affecting left non-dominant side (HCC) 12/29/2018   IBS (irritable bowel syndrome) 08/06/2017   Dyslipidemia associated with type 2 diabetes mellitus (HCC) 06/25/2016   Proliferative diabetic retinopathy (HCC) 09/29/2015   Chronic cough 10/04/2014   B12 deficiency 05/10/2014   Anemia in chronic kidney disease, on chronic dialysis (HCC) 04/26/2014   CKD (chronic kidney disease) stage 5, GFR less than 15 ml/min (HCC) 04/26/2014   Secondary hyperparathyroidism of renal origin (HCC) 11/27/2013   Posterior subcapsular cataract, bilateral 10/18/2013   T2DM (type 2 diabetes mellitus) (HCC) 02/19/2011   Hypertension associated with diabetes (HCC) 02/19/2011   Gastroparesis due to DM (HCC) 02/19/2011   Past Surgical History:  Procedure Laterality Date   A/V FISTULAGRAM Right 03/03/2020   Procedure: Venogram;  Surgeon: Maeola Harman, MD;  Location: [facility] INVASIVE CV LAB;  Service: Cardiovascular;  Laterality: Right;   ARTERY REPAIR  Right 02/17/2018   Procedure: EXPLORATION OF RIGHT BRACHIAL ARTERY;  Surgeon: Fransisco Hertz, MD;  Location: [facility] OR;  Service: Vascular;  Laterality: Right;   ARTERY REPAIR Right 02/17/2018   Procedure: BRACHIAL ARTERY EXPLORATION AND TRHOMBECTOMY;  Surgeon: Fransisco Hertz, MD;  Location: [facility] OR;  Service: Vascular;  Laterality: Right;   AV FISTULA PLACEMENT Left 05/09/2017   Procedure: INSERTION OF ARTERIOVENOUS (AV) GRAFT ARM (ARTEGRAFT);  Surgeon: Fransisco Hertz, MD;  Location: [facility] OR;  Service: Vascular;  Laterality: Left;   AV FISTULA PLACEMENT  Right 02/17/2018   Procedure: INSERTION OF ARTERIOVENOUS (AV) GORE-TEX GRAFT ARM RIGHT UPPER ARM;  Surgeon: Fransisco Hertz, MD;  Location: [facility] OR;  Service: Vascular;  Laterality: Right;   AV FISTULA PLACEMENT Left 10/10/2019   Procedure: INSERTION OF LEFT ARTERIOVENOUS (AV) ARTEGRAFT GRAFT ARM;  Surgeon: Maeola Harman, MD;  Location: [facility] OR;  Service: Vascular;  Laterality: Left;   AV FISTULA PLACEMENT Right 03/19/2020   Procedure: INSERTION OF ARTERIOVENOUS (AV) GORE-TEX GRAFT IN RIGHT ARM AND VIABAHN STENT IN RIGHT AXILLARY ARTERY;  Surgeon: Maeola Harman, MD;  Location: [facility] OR;  Service: Vascular;  Laterality: Right;   BASCILIC VEIN TRANSPOSITION Left 08/31/2016   Procedure: BASILIC VEIN TRANSPOSITION FIRST STAGE;  Surgeon: Fransisco Hertz, MD;  Location: [facility] OR;  Service: Vascular;  Laterality: Left;   BRONCHIAL WASHINGS  09/17/2021   Procedure: BRONCHIAL WASHINGS;  Surgeon: Leslye Peer, MD;  Location: [facility] ENDOSCOPY;  Service: Cardiopulmonary;;   BUBBLE STUDY  12/16/2022   Procedure: BUBBLE STUDY;  Surgeon: Little Ishikawa, MD;  Location: [facility] ENDOSCOPY;  Service: Cardiovascular;;   CENTRAL VENOUS CATHETER INSERTION Left 07/24/2018   Procedure: INSERTION CENTRAL LINE ADULT;  Surgeon: Fransisco Hertz, MD;  Location: [facility] OR;  Service: Vascular;  Laterality: Left;   EYE SURGERY     secondary to diabetic retinopathy    FOOT SURGERY Right    t"ook bone out- maybe hammer toe"   INSERTION OF DIALYSIS CATHETER Left 07/24/2018   Procedure: INSERTION OF TUNNELED DIALYSIS CATHETER;  Surgeon: Fransisco Hertz, MD;  Location: [facility] OR;  Service: Vascular;  Laterality: Left;   IR FLUORO GUIDE CV LINE LEFT  12/19/2021   IR FLUORO GUIDE CV LINE LEFT  12/29/2021   IR FLUORO GUIDE CV LINE RIGHT  07/21/2018   IR FLUORO GUIDE CV LINE RIGHT  06/01/2019   IR FLUORO GUIDE CV LINE RIGHT  05/05/2020   IR FLUORO GUIDE CV LINE RIGHT  10/04/2022   IR REMOVAL TUN CV CATH W/O FL  01/23/2021   IR REMOVAL TUN CV  CATH W/O FL  12/19/2021   IR REMOVAL TUN CV CATH W/O FL  10/01/2022   IR US GUIDE VASC ACCESS LEFT  12/19/2021   IR US GUIDE VASC ACCESS RIGHT  07/21/2018   IR US GUIDE VASC ACCESS RIGHT  06/01/2019   IR US GUIDE VASC ACCESS RIGHT  10/04/2022   LIGATION ARTERIOVENOUS GORTEX GRAFT Right 03/20/2020   Procedure: LIGATION ARM ARTERIOVENOUS GORTEX GRAFT With Patch Angioplasty, Thrombectomy;  Surgeon: Maeola Harman, MD;  Location: [facility] OR;  Service: Vascular;  Laterality: Right;   LIGATION OF ARTERIOVENOUS  FISTULA Left 05/09/2017   Procedure: LIGATION OF ARTERIOVENOUS  FISTULA;  Surgeon: Fransisco Hertz, MD;  Location: [facility] OR;  Service: Vascular;  Laterality: Left;   LOOP RECORDER INSERTION N/A 12/05/2017   Procedure: LOOP RECORDER INSERTION;  Surgeon: Marinus Maw, MD;  Location: [facility] INVASIVE CV LAB;  Service: Cardiovascular;  Laterality: N/A;   MYOMECTOMY     REMOVAL OF GRAFT Right 02/17/2018   Procedure: REMOVAL OF RIGHT UPPER ARM ARTERIOVENOUS GRAFT;  Surgeon: Fransisco Hertz, MD;  Location: [facility] OR;  Service: Vascular;  Laterality: Right;   REVISION OF ARTERIOVENOUS GORETEX GRAFT Left 07/24/2018   Procedure: REDO ARTERIOVENOUS GORETEX GRAFT;  Surgeon: Fransisco Hertz, MD;  Location: [facility] OR;  Service: Vascular;  Laterality: Left;   TEE WITHOUT CARDIOVERSION N/A 12/05/2017   Procedure: TRANSESOPHAGEAL ECHOCARDIOGRAM (TEE);  Surgeon: Thurmon Fair, MD;  Location: [facility] ENDOSCOPY;  Service: Cardiovascular;  Laterality: N/A;   TEE WITHOUT CARDIOVERSION N/A 09/30/2022   Procedure: TRANSESOPHAGEAL ECHOCARDIOGRAM (TEE);  Surgeon: Jake Bathe, MD;  Location: [facility] ENDOSCOPY;  Service: Cardiovascular;  Laterality: N/A;   TEE WITHOUT CARDIOVERSION N/A 12/16/2022   Procedure: TRANSESOPHAGEAL ECHOCARDIOGRAM (TEE);  Surgeon: Little Ishikawa, MD;  Location: [facility] ENDOSCOPY;  Service: Cardiovascular;  Laterality: N/A;   UPPER EXTREMITY VENOGRAPHY Left 07/13/2018   Procedure: UPPER EXTREMITY VENOGRAPHY - Central &  Left Arm;  Surgeon: Fransisco Hertz, MD;  Location: [facility] INVASIVE CV LAB;  Service: Cardiovascular;  Laterality: Left;   UPPER EXTREMITY VENOGRAPHY Bilateral 09/10/2019   Procedure: UPPER EXTREMITY VENOGRAPHY;  Surgeon: Maeola Harman, MD;  Location: [facility] INVASIVE CV LAB;  Service: Cardiovascular;  Laterality: Bilateral;   UTERINE FIBROID SURGERY     VIDEO BRONCHOSCOPY N/A 09/17/2021   Procedure: VIDEO BRONCHOSCOPY WITHOUT FLUORO;  Surgeon: Leslye Peer, MD;  Location: [facility] ENDOSCOPY;  Service: Cardiopulmonary;  Laterality: N/A;   VITRECTOMY Bilateral     Family History  Problem Relation Age of Onset   Colon polyps Mother    Diabetes Mother    Heart murmur Father        history essentially unknown   Hypertension Father    Diabetes Sister    Kidney failure Sister        kidney transplant   Diabetes Brother    Retinal degeneration Brother    Heart murmur Son    Stroke Paternal Grandmother    Diabetes Sister     Medications- Reconciled discharge and current medications in Epic.  Current Outpatient Medications  Medication Sig Dispense Refill   albuterol (VENTOLIN HFA) 108 (90 Base) MCG/ACT inhaler Inhale 2 puffs into the lungs every 6 (six) hours as needed for wheezing or shortness of breath.     amLODipine (NORVASC) 10 MG tablet Take 10 mg by mouth 4 (four) times a week. Take one tablet (10 mg dose) by mouth 4 (four) times a week. SUNDAY-TUESDAY-THURSDAY-SATURDAY - NON- DIALYSIS DAYS-Start date: 08/20/2023     aspirin 81 MG chewable tablet Chew 1 tablet (81 mg total) by mouth daily. 30 tablet 0   atorvastatin (LIPITOR) 80 MG tablet Take 1 tablet (80 mg total) by mouth at bedtime. (Patient taking differently: Take 80 mg by mouth at bedtime. Changed to 40mg ) 30 tablet 0   azelastine (ASTELIN) 0.1 % nasal spray Place 2 sprays into both nostrils 2 (two) times daily. 30 mL 12   calcitRIOL (ROCALTROL) 0.5 MCG capsule Take 0.5 mcg by mouth daily.     calcium acetate (PHOSLO) 667 MG  capsule Take 2,001 mg by mouth 3 (three) times daily with meals.     carvedilol (COREG) 6.25 MG tablet Take 6.25 mg by mouth 2 (two) times daily with a meal.     cetirizine (ZYRTEC) 5 MG chewable tablet Chew 5 mg by mouth daily. Take one half tablet (2.5 mg dose)  by mouth as needed     Continuous Blood Gluc Sensor (FREESTYLE LIBRE 14 DAY SENSOR) MISC 1 patch by Does not apply route every 14 (fourteen) days. 6 each 5   cromolyn (OPTICROM) 4 % ophthalmic solution Place 1 drop into both eyes 4 (four) times daily as needed. (Patient taking differently: Place 1 drop into both eyes 4 (four) times daily as needed (for allergies).) 10 mL 3   cyclobenzaprine (FLEXERIL) 5 MG tablet Take 5 mg by mouth 2 (two) times daily as needed for muscle spasms.     docusate sodium (COLACE) 100 MG capsule Take 100 mg by mouth daily as needed for mild constipation.     Doxercalciferol (HECTOROL IV) Doxercalciferol (Hectorol)     doxycycline (VIBRA-TABS) 100 MG tablet Take 100 mg by mouth 2 (two) times daily. Take 1 tablet (100 mg total) by mouth 2 (two) times a day for 2 days. Take with 8 oz water. Do not lie down for at least 30 minutes after.     fluticasone (FLONASE) 50 MCG/ACT nasal spray Place 2 sprays into both nostrils daily. 18.2 mL 2   furosemide (LASIX) 80 MG tablet Take 80 mg by mouth 2 (two) times daily.     gabapentin (NEURONTIN) 100 MG capsule Take 1 capsule (100 mg total) by mouth 3 (three) times a week. With Dialysis 90 capsule 3   insulin aspart (NOVOLOG FLEXPEN) 100 UNIT/ML FlexPen Inject 1 Units into the skin 3 (three) times daily with meals. Inject under the skin 15 min before meals: 6 u before Breakfast,8 u before Lunch, 8 u before Supper plus scale BG-150/25 up to 40 u daily     Insulin Pen Needle 29G X MISC 1 Device by Does not apply route 4 (four) times daily. 400 each 3   ketoconazole (NIZORAL) 2 % cream Apply 1 Application topically daily as needed for irritation.     lidocaine (LIDODERM) 5 %  Place 1 patch onto the skin daily. Remove & Discard patch within 12 hours or as directed by MD     lidocaine-prilocaine (EMLA) cream Apply 1 Application topically as needed (for HD).     lisinopril (ZESTRIL) 40 MG tablet Take 40 mg by mouth daily.     Methoxy PEG-Epoetin Beta (MIRCERA IJ) Mircera     midodrine (PROAMATINE) 5 MG tablet Take by mouth.     Multiple Vitamins-Minerals (COMPLETE MULTIVITAMIN/MINERAL PO) Take 1 tablet by mouth.     Olopatadine HCl (PATADAY) 0.2 % SOLN Apply 1 drop to eye daily. (Patient taking differently: Place 1 drop into both eyes daily as needed (dry eyes).) 2.5 mL 3   pantoprazole (PROTONIX) 40 MG tablet TAKE 1 TABLET BY MOUTH EVERY DAY 90 tablet 1   phenol (CHLORASEPTIC) 1.4 % LIQD Use as directed 1 spray in the mouth or throat as needed for throat irritation / pain. Use 1 spray in the mouth or throat every 2 (two) hours as needed (throat pain) for up to 10 days.     polyethylene glycol (MIRALAX / GLYCOLAX) 17 g packet Take 17 g by mouth daily.     propranolol (INDERAL) 20 MG tablet Take 0.5 tablets (10 mg total) by mouth daily. 30 tablet 2   Rimegepant Sulfate (NURTEC) 75 MG TBDP Take 1 tablet (75 mg total) by mouth daily as needed (migraine). 30 tablet 0   thiamine 50 MG tablet Take 50 mg by mouth daily. Take 2 tablets (100 mg total) by mouth daily.  No current facility-administered medications for this visit.    Allergies-reviewed and updated Allergies  Allergen Reactions   Ibuprofen Other (See Comments)    CKD stage 3. Should avoid.   Dilaudid [Hydromorphone Hcl] Itching and Other (See Comments)    Can take with Benadryl.   Iodinated Contrast Media Rash   Tramadol Itching    Social History   Socioeconomic History   Marital status: Married    Spouse name: Not on file   Number of children: 1   Years of education: college   Highest education level: Not on file  Occupational History   Occupation: customer service rep    Employer: [employer]  Tobacco Use   Smoking status: Never    Passive exposure: Never   Smokeless tobacco: Never  Vaping Use   Vaping status: Never Used  Substance and Sexual Activity   Alcohol use: No    Alcohol/week: 0.0 standard drinks of alcohol   Drug use: No   Sexual activity: Not Currently    Partners: Male    Birth control/protection: I.U.D.  Other Topics Concern   Not on file  Social History Narrative   Patient lives with fiance. She has 1 grown son. Works full-time, she Database administrator for attorney.   Caffeine Use: 2 cups daily; sodas occasionally   She has 7 grandchildren      Raised by step father who is still living.       Right Handed    Lives in a two story home but does not use the upstairs.    Social Determinants of Health   Financial Resource Strain: Low Risk  (10/26/2022)   Overall Financial Resource Strain (CARDIA)    Difficulty of Paying Living Expenses: Not hard at all  Food Insecurity: No Food Insecurity (08/23/2023)   Hunger Vital Sign    Worried About Running Out of Food in the Last Year: Never true    Ran Out of Food in the Last Year: Never true  Transportation Needs: No Transportation Needs (08/23/2023)   PRAPARE - Administrator, Civil Service (Medical): No    Lack of Transportation (Non-Medical): No  Physical Activity: Inactive (10/26/2022)   Exercise Vital Sign    Days of Exercise per Week: 0 days    Minutes of Exercise per Session: 0 min  Stress: No Stress Concern Present (08/17/2023)   Received from [facility] of Occupational Health - Occupational Stress Questionnaire    Feeling of Stress : Not at all  Social Connections: Unknown (08/17/2023)   Received from [facility]   Social Network    Social Network: Not on file        Objective:  Physical Exam: BP 105/71   Pulse 89   Temp 98 F (36.7 C) (Temporal)   Ht 5\' 9"  (1.753 m)   LMP 09/10/2021   SpO2 100%   BMI 36.48 kg/m   Gen: NAD, resting  comfortably CV: RRR with no murmurs appreciated Pulm: NWOB, occasional rhonchi noted. GI: Normal bowel sounds present. Soft, Nontender, Nondistended. MSK: No edema, cyanosis, or clubbing noted Skin: Warm, dry Neuro: Grossly normal, moves all extremities Psych: Normal affect and thought content  Time Spent: 45 minutes of total time was spent on the date of the encounter performing the following actions: chart review prior to seeing the patient including recent hospitalization, obtaining history, performing a medically necessary exam, counseling on the treatment plan, placing orders, and documenting in our EHR.  [name]. [name], MD 09/01/2023 7:56 AM

## 2023-09-01 NOTE — Assessment & Plan Note (Signed)
Last seen on see at goal without any medications.  Too early to recheck today.  Will recheck next office visit.

## 2023-09-01 NOTE — Patient Instructions (Signed)
It was very nice to see you today!  I am glad that you are feeling better.  If I will refer you to the rheumatologist for evaluation for lupus.  Will also refer you to the blood pressure specialist.  Return in about 3 months (around 12/01/2023) for Follow Up.   Take care, Dr [name]  PLEASE NOTE:  If you had any lab tests, please let us know if you have not heard back within a few days. You may see your results on mychart before we have a chance to review them but we will give you a call once they are reviewed by [name].   If we ordered any referrals today, please let us know if you have not heard from their office within the next week.   If you had any urgent prescriptions sent in today, please check with the pharmacy within an hour of our visit to make sure the prescription was transmitted appropriately.   Please try these tips to maintain a healthy lifestyle:  Eat at least 3 REAL meals and 1-2 snacks per day.  Aim for no more than 5 hours between eating.  If you eat breakfast, please do so within one hour of getting up.   Each meal should contain half fruits/vegetables, one quarter protein, and one quarter carbs (no bigger than a computer mouse)  Cut down on sweet beverages. This includes juice, soda, and sweet tea.   Drink at least 1 glass of water with each meal and aim for at least 8 glasses per day  Exercise at least 150 minutes every week.

## 2023-09-01 NOTE — Assessment & Plan Note (Signed)
She was referred to rheumatology during hospitalization a month ago however she was not able to schedule an appointment with them.  Will place referral again today.  She last saw rheumatology about 3 to 4 years ago.

## 2023-09-10 LAB — BASIC METABOLIC PANEL
BUN: 32 — AB (ref 4–21)
Chloride: 94 — AB (ref 99–108)
Creatinine: 8.5 — AB (ref 0.5–1.1)
Potassium: 5.2 mEq/L — AB (ref 3.5–5.1)

## 2023-09-10 LAB — COMPREHENSIVE METABOLIC PANEL: Calcium: 9.1 (ref 8.7–10.7)

## 2023-09-10 LAB — LIPID PANEL
Cholesterol: 151 (ref 0–200)
HDL: 30 — AB (ref 35–70)
LDL Cholesterol: 86
Triglycerides: 173 — AB (ref 40–160)

## 2023-09-10 LAB — HEMOGLOBIN A1C: Hemoglobin A1C: 5.6

## 2023-09-21 ENCOUNTER — Encounter: Payer: Self-pay | Admitting: Family Medicine

## 2023-09-26 LAB — LIPID PANEL
Cholesterol: 151 (ref 0–200)
HDL: 30 — AB (ref 35–70)
LDL Cholesterol: 86
Triglycerides: 173 — AB (ref 40–160)

## 2023-09-26 LAB — BASIC METABOLIC PANEL
BUN: 51 — AB (ref 4–21)
CO2: 21 (ref 13–22)
Chloride: 95 — AB (ref 99–108)
Glucose: 139
Potassium: 4.4 meq/L (ref 3.5–5.1)
Sodium: 133 — AB (ref 137–147)

## 2023-09-26 LAB — COMPREHENSIVE METABOLIC PANEL: Calcium: 9.7 (ref 8.7–10.7)

## 2023-09-26 LAB — HEMOGLOBIN A1C: Hemoglobin A1C: 5.6

## 2023-09-28 ENCOUNTER — Encounter: Payer: Self-pay | Admitting: Family Medicine

## 2023-10-07 ENCOUNTER — Telehealth: Payer: Self-pay | Admitting: Family Medicine

## 2023-10-07 NOTE — Telephone Encounter (Signed)
Patient dropped off document FMLA, to be filled out by provider. Patient requested to send it back via Fax within 5-days. Document is located in providers tray at front office.Please advise at  [phone].

## 2023-10-12 NOTE — Telephone Encounter (Signed)
Place in PCP office to be reviewed

## 2023-10-19 NOTE — Telephone Encounter (Signed)
See note

## 2023-10-19 NOTE — Telephone Encounter (Signed)
Patient's spouse states patient is currently on ventilator but hasn't had any updates on FMLA paperwork. Where are we at with this?

## 2023-10-20 NOTE — Telephone Encounter (Signed)
[name] currently admitted to [facility]. Recommend they complete paperwork. I have a disability form however I cannot complete it as it requires an office visit.  [name]. [name], MD 10/20/2023 12:55 PM

## 2023-10-21 NOTE — Telephone Encounter (Signed)
Patient's spouse called for an update. I informed caller of the message below. Caller said the form stated it had to be PCP per the form but he would try to ask [facility] if they could do it.

## 2023-10-27 ENCOUNTER — Encounter (HOSPITAL_BASED_OUTPATIENT_CLINIC_OR_DEPARTMENT_OTHER): Payer: Self-pay

## 2023-10-31 NOTE — Telephone Encounter (Signed)
Patient's spouse requests to pick up FMLA Forms for [facility] to complete. Requests to be called when ready for pick up

## 2023-10-31 NOTE — Telephone Encounter (Signed)
Forms have been picked up by spouse

## 2023-11-19 ENCOUNTER — Encounter (HOSPITAL_COMMUNITY): Payer: Self-pay | Admitting: *Deleted

## 2023-12-01 ENCOUNTER — Encounter (HOSPITAL_COMMUNITY): Payer: Self-pay | Admitting: *Deleted

## 2023-12-09 ENCOUNTER — Telehealth: Payer: Self-pay | Admitting: Family Medicine

## 2023-12-09 ENCOUNTER — Inpatient Hospital Stay: Payer: Self-pay | Admitting: Family Medicine

## 2023-12-09 NOTE — Progress Notes (Unsigned)
Chief Complaint:  Heather [name] is a 52 y.o. female who presents today for a TCM visit.  Assessment/Plan:  New/Acute Problems: ***  Chronic Problems Addressed Today: No problem-specific Assessment & Plan notes found for this encounter.   Patient has a {Desc; moderate/high:110033} level of medical decision making.     Subjective:  HPI:  Summary of Hospital admission: Reason for admission: Stroke Date of admission: 12/02/2023 Date of discharge: 12/06/2023 Date of Interactive contact: N/A - today is within 2 days of discharge Summary of Hospital course: Patient presented to the ED on 12/02/2023 after a lengthy hospital stay of 83 days from 09/09/2023 to 11/30/2023 due to stroke.  This hospitalization was notable for complications including PEA arrest while undergoing TEE.  She was subsequently intubated and ended up requiring tracheostomy on 09/30/2023.  She was discharged home on 11/30/2023 however over the next 2 to 3 days started developing twitching and altered mental status.  Her most recent hospitalization code stroke was called.  CT scan did not show any acute abnormalities.  Additionally had MRI and MRA which were negative for acute abnormalities.  She was treated for hospital-acquired pneumonia with a 7-day course of antibiotics.  She was recommended to continue with Eliquis and aspirin and was discharged home.  Interim history:  ***  ROS: ***, otherwise a complete review of systems was negative.   PMH:  The following were reviewed and entered/updated in epic: 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HCC), nonhemorrhagic, inferior right cerebellum 12/03/2017   left side weakness in leg   Cyst of left ovary 01/12/2018   Diabetic neuropathy, painful (HCC), on  low dose Gabapentin 07/13/2013   DM (diabetes mellitus), type 2, uncontrolled, with renal complications 10/12/2016   DM2 (diabetes mellitus, type 2) (HCC)    Dysfunction of left eustachian tube, with pusatile tinnitus 11/24/2017   Dyslipidemia associated with type 2 diabetes mellitus (HCC) 06/25/2016   ESRD (end stage renal disease) on dialysis [facility])    On hemodialysis in July 2019 via [facility] then switched to [facility] in Nov 2019.    ESRD with anemia (HCC)    Fibromyalgia    Gastroparesis due to DM    GERD (gastroesophageal reflux disease)    Headache    Hypertension associated with diabetes (HCC) 02/19/2011   IBS (irritable bowel syndrome)    Left-sided weakness 12/10/2017   .   Leiomyoma of uterus    Pancreatitis    PE (pulmonary thromboembolism) (HCC) 11/04/2019   Pneumonia    Proliferative diabetic retinopathy (HCC) 09/29/2015   Pronation deformity of both feet 06/14/2014   Reactive depression 12/10/2017   .   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   DNR (do not resuscitate) discussion 12/14/2022   PVD (peripheral vascular disease) (HCC) 12/12/2022   Allergic conjunctivitis 10/12/2022   Mitral valve mass    Right atrial mass  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   Shortness of breath 07/02/2022   Thrombus of venous dialysis catheter (HCC) 02/25/2022   Displacement of vascular dialysis catheter, initial encounter (HCC) 12/18/2021   S/P BKA (below knee amputation), right (HCC) 03/31/2021   Impaired mobility and ADLs 03/25/2021   CVA (cerebral vascular accident) (HCC) 03/04/2021   Pulmonary embolus (HCC)  03/04/2021   Charcot ankle, right 02/23/2021   Hx of cardiac arrest 11/12/2020   Abnormal CT of the chest 09/26/2020   Allergy, unspecified, initial encounter 09/22/2020   Other mechanical complication of other cardiac and vascular devices and implants, initial encounter (HCC) 05/10/2020   Positive ANA (antinuclear antibody) 04/16/2020   Pericardial effusion 06/14/2019   ESRD on hemodialysis (HCC)    Hemiplegia and hemiparesis following cerebral infarction affecting left non-dominant side (HCC) 12/29/2018   IBS (irritable bowel syndrome) 08/06/2017   Dyslipidemia associated with type 2 diabetes mellitus (HCC) 06/25/2016   Proliferative diabetic retinopathy (HCC) 09/29/2015   Chronic cough 10/04/2014   B12 deficiency 05/10/2014   Anemia in chronic kidney disease, on chronic dialysis (HCC) 04/26/2014   CKD (chronic kidney disease) stage 5, GFR less than 15 ml/min (HCC) 04/26/2014   Secondary hyperparathyroidism of renal origin (HCC) 11/27/2013   Posterior subcapsular cataract, bilateral 10/18/2013   T2DM (type 2 diabetes mellitus) (HCC) 02/19/2011   Hypertension associated with diabetes (HCC) 02/19/2011   Gastroparesis due to DM (HCC) 02/19/2011   Past Surgical History:  Procedure Laterality Date   A/V FISTULAGRAM Right 03/03/2020   Procedure: Venogram;  Surgeon: Maeola Harman, MD;  Location: [facility] INVASIVE CV LAB;  Service: Cardiovascular;  Laterality: Right;   ARTERY REPAIR Right 02/17/2018   Procedure: EXPLORATION OF RIGHT BRACHIAL ARTERY;  Surgeon: Fransisco Hertz, MD;  Location: [facility] OR;  Service: Vascular;  Laterality: Right;   ARTERY REPAIR Right 02/17/2018   Procedure: BRACHIAL ARTERY EXPLORATION AND TRHOMBECTOMY;  Surgeon: Fransisco Hertz, MD;  Location: [facility] OR;  Service: Vascular;  Laterality: Right;   AV FISTULA PLACEMENT Left 05/09/2017   Procedure: INSERTION OF ARTERIOVENOUS (AV) GRAFT ARM (ARTEGRAFT);  Surgeon: Fransisco Hertz, MD;  Location: [facility] OR;  Service: Vascular;   Laterality: Left;   AV FISTULA PLACEMENT Right 02/17/2018   Procedure: INSERTION OF ARTERIOVENOUS (AV) GORE-TEX GRAFT ARM RIGHT UPPER ARM;  Surgeon: Fransisco Hertz, MD;  Location: [facility] OR;  Service: Vascular;  Laterality: Right;   AV FISTULA PLACEMENT Left 10/10/2019   Procedure: INSERTION OF LEFT ARTERIOVENOUS (AV) ARTEGRAFT GRAFT ARM;  Surgeon: Maeola Harman, MD;  Location: [facility] OR;  Service: Vascular;  Laterality: Left;   AV FISTULA PLACEMENT Right 03/19/2020   Procedure: INSERTION OF ARTERIOVENOUS (AV) GORE-TEX GRAFT IN RIGHT ARM AND VIABAHN STENT IN RIGHT AXILLARY ARTERY;  Surgeon: Maeola Harman, MD;  Location: [facility] OR;  Service: Vascular;  Laterality: Right;   BASCILIC VEIN TRANSPOSITION Left 08/31/2016   Procedure: BASILIC VEIN TRANSPOSITION FIRST STAGE;  Surgeon: Fransisco Hertz, MD;  Location: [facility] OR;  Service: Vascular;  Laterality: Left;   BRONCHIAL WASHINGS  09/17/2021   Procedure: BRONCHIAL WASHINGS;  Surgeon: Leslye Peer, MD;  Location: [facility] ENDOSCOPY;  Service: Cardiopulmonary;;   BUBBLE STUDY  12/16/2022   Procedure: BUBBLE STUDY;  Surgeon: Little Ishikawa, MD;  Location: [facility] ENDOSCOPY;  Service: Cardiovascular;;   CENTRAL VENOUS CATHETER INSERTION Left 07/24/2018   Procedure: INSERTION CENTRAL LINE ADULT;  Surgeon: Fransisco Hertz, MD;  Location: [facility] OR;  Service: Vascular;  Laterality: Left;   EYE SURGERY  secondary to diabetic retinopathy    FOOT SURGERY Right    t"ook bone out- maybe hammer toe"   INSERTION OF DIALYSIS CATHETER Left 07/24/2018   Procedure: INSERTION OF TUNNELED DIALYSIS CATHETER;  Surgeon: Fransisco Hertz, MD;  Location: [facility] OR;  Service: Vascular;  Laterality: Left;   IR FLUORO GUIDE CV LINE LEFT  12/19/2021   IR FLUORO GUIDE CV LINE LEFT  12/29/2021   IR FLUORO GUIDE CV LINE RIGHT  07/21/2018   IR FLUORO GUIDE CV LINE RIGHT  06/01/2019   IR FLUORO GUIDE CV LINE RIGHT  05/05/2020   IR FLUORO GUIDE CV LINE RIGHT  10/04/2022   IR REMOVAL TUN CV CATH  W/O FL  01/23/2021   IR REMOVAL TUN CV CATH W/O FL  12/19/2021   IR REMOVAL TUN CV CATH W/O FL  10/01/2022   IR US GUIDE VASC ACCESS LEFT  12/19/2021   IR US GUIDE VASC ACCESS RIGHT  07/21/2018   IR US GUIDE VASC ACCESS RIGHT  06/01/2019   IR US GUIDE VASC ACCESS RIGHT  10/04/2022   LIGATION ARTERIOVENOUS GORTEX GRAFT Right 03/20/2020   Procedure: LIGATION ARM ARTERIOVENOUS GORTEX GRAFT With Patch Angioplasty, Thrombectomy;  Surgeon: Maeola Harman, MD;  Location: [facility] OR;  Service: Vascular;  Laterality: Right;   LIGATION OF ARTERIOVENOUS  FISTULA Left 05/09/2017   Procedure: LIGATION OF ARTERIOVENOUS  FISTULA;  Surgeon: Fransisco Hertz, MD;  Location: [facility] OR;  Service: Vascular;  Laterality: Left;   LOOP RECORDER INSERTION N/A 12/05/2017   Procedure: LOOP RECORDER INSERTION;  Surgeon: Marinus Maw, MD;  Location: [facility] INVASIVE CV LAB;  Service: Cardiovascular;  Laterality: N/A;   MYOMECTOMY     REMOVAL OF GRAFT Right 02/17/2018   Procedure: REMOVAL OF RIGHT UPPER ARM ARTERIOVENOUS GRAFT;  Surgeon: Fransisco Hertz, MD;  Location: [facility] OR;  Service: Vascular;  Laterality: Right;   REVISION OF ARTERIOVENOUS GORETEX GRAFT Left 07/24/2018   Procedure: REDO ARTERIOVENOUS GORETEX GRAFT;  Surgeon: Fransisco Hertz, MD;  Location: [facility] OR;  Service: Vascular;  Laterality: Left;   TEE WITHOUT CARDIOVERSION N/A 12/05/2017   Procedure: TRANSESOPHAGEAL ECHOCARDIOGRAM (TEE);  Surgeon: Thurmon Fair, MD;  Location: [facility] ENDOSCOPY;  Service: Cardiovascular;  Laterality: N/A;   TEE WITHOUT CARDIOVERSION N/A 09/30/2022   Procedure: TRANSESOPHAGEAL ECHOCARDIOGRAM (TEE);  Surgeon: Jake Bathe, MD;  Location: [facility] ENDOSCOPY;  Service: Cardiovascular;  Laterality: N/A;   TEE WITHOUT CARDIOVERSION N/A 12/16/2022   Procedure: TRANSESOPHAGEAL ECHOCARDIOGRAM (TEE);  Surgeon: Little Ishikawa, MD;  Location: [facility] ENDOSCOPY;  Service: Cardiovascular;  Laterality: N/A;   UPPER EXTREMITY VENOGRAPHY Left 07/13/2018   Procedure:  UPPER EXTREMITY VENOGRAPHY - Central & Left Arm;  Surgeon: Fransisco Hertz, MD;  Location: [facility] INVASIVE CV LAB;  Service: Cardiovascular;  Laterality: Left;   UPPER EXTREMITY VENOGRAPHY Bilateral 09/10/2019   Procedure: UPPER EXTREMITY VENOGRAPHY;  Surgeon: Maeola Harman, MD;  Location: [facility] INVASIVE CV LAB;  Service: Cardiovascular;  Laterality: Bilateral;   UTERINE FIBROID SURGERY     VIDEO BRONCHOSCOPY N/A 09/17/2021   Procedure: VIDEO BRONCHOSCOPY WITHOUT FLUORO;  Surgeon: Leslye Peer, MD;  Location: [facility] ENDOSCOPY;  Service: Cardiopulmonary;  Laterality: N/A;   VITRECTOMY Bilateral     Family History  Problem Relation Age of Onset   Colon polyps Mother    Diabetes Mother    Heart murmur Father        history essentially unknown   Hypertension Father    Diabetes Sister    Kidney  failure Sister        kidney transplant   Diabetes Brother    Retinal degeneration Brother    Heart murmur Son    Stroke Paternal Grandmother    Diabetes Sister     Medications- Reconciled discharge and current medications in Epic.  Current Outpatient Medications  Medication Sig Dispense Refill   albuterol (VENTOLIN HFA) 108 (90 Base) MCG/ACT inhaler Inhale 2 puffs into the lungs every 6 (six) hours as needed for wheezing or shortness of breath.     amLODipine (NORVASC) 10 MG tablet Take 10 mg by mouth 4 (four) times a week. Take one tablet (10 mg dose) by mouth 4 (four) times a week. SUNDAY-TUESDAY-THURSDAY-SATURDAY - NON- DIALYSIS DAYS-Start date: 08/20/2023     aspirin 81 MG chewable tablet Chew 1 tablet (81 mg total) by mouth daily. 30 tablet 0   atorvastatin (LIPITOR) 80 MG tablet Take 1 tablet (80 mg total) by mouth at bedtime. (Patient taking differently: Take 80 mg by mouth at bedtime. Changed to 40mg ) 30 tablet 0   azelastine (ASTELIN) 0.1 % nasal spray Place 2 sprays into both nostrils 2 (two) times daily. 30 mL 12   calcitRIOL (ROCALTROL) 0.5 MCG capsule Take 0.5 mcg by mouth daily.      calcium acetate (PHOSLO) 667 MG capsule Take 2,001 mg by mouth 3 (three) times daily with meals.     carvedilol (COREG) 6.25 MG tablet Take 6.25 mg by mouth 2 (two) times daily with a meal.     cetirizine (ZYRTEC) 5 MG chewable tablet Chew 5 mg by mouth daily. Take one half tablet (2.5 mg dose) by mouth as needed     Continuous Blood Gluc Sensor (FREESTYLE LIBRE 14 DAY SENSOR) MISC 1 patch by Does not apply route every 14 (fourteen) days. 6 each 5   cromolyn (OPTICROM) 4 % ophthalmic solution Place 1 drop into both eyes 4 (four) times daily as needed. (Patient taking differently: Place 1 drop into both eyes 4 (four) times daily as needed (for allergies).) 10 mL 3   cyclobenzaprine (FLEXERIL) 5 MG tablet Take 5 mg by mouth 2 (two) times daily as needed for muscle spasms.     docusate sodium (COLACE) 100 MG capsule Take 100 mg by mouth daily as needed for mild constipation.     Doxercalciferol (HECTOROL IV) Doxercalciferol (Hectorol)     doxycycline (VIBRA-TABS) 100 MG tablet Take 100 mg by mouth 2 (two) times daily. Take 1 tablet (100 mg total) by mouth 2 (two) times a day for 2 days. Take with 8 oz water. Do not lie down for at least 30 minutes after.     fluticasone (FLONASE) 50 MCG/ACT nasal spray Place 2 sprays into both nostrils daily. 18.2 mL 2   furosemide (LASIX) 80 MG tablet Take 80 mg by mouth 2 (two) times daily.     gabapentin (NEURONTIN) 100 MG capsule Take 1 capsule (100 mg total) by mouth 3 (three) times a week. With Dialysis 90 capsule 3   insulin aspart (NOVOLOG FLEXPEN) 100 UNIT/ML FlexPen Inject 1 Units into the skin 3 (three) times daily with meals. Inject under the skin 15 min before meals: 6 u before Breakfast,8 u before Lunch, 8 u before Supper plus scale BG-150/25 up to 40 u daily     Insulin Pen Needle 29G X MISC 1 Device by Does not apply route 4 (four) times daily. 400 each 3   ketoconazole (NIZORAL) 2 % cream Apply 1 Application topically daily  as needed for irritation.      lidocaine (LIDODERM) 5 % Place 1 patch onto the skin daily. Remove & Discard patch within 12 hours or as directed by MD     lidocaine-prilocaine (EMLA) cream Apply 1 Application topically as needed (for HD).     lisinopril (ZESTRIL) 40 MG tablet Take 40 mg by mouth daily.     Methoxy PEG-Epoetin Beta (MIRCERA IJ) Mircera     midodrine (PROAMATINE) 5 MG tablet Take by mouth.     Multiple Vitamins-Minerals (COMPLETE MULTIVITAMIN/MINERAL PO) Take 1 tablet by mouth.     Olopatadine HCl (PATADAY) 0.2 % SOLN Apply 1 drop to eye daily. (Patient taking differently: Place 1 drop into both eyes daily as needed (dry eyes).) 2.5 mL 3   pantoprazole (PROTONIX) 40 MG tablet TAKE 1 TABLET BY MOUTH EVERY DAY 90 tablet 1   phenol (CHLORASEPTIC) 1.4 % LIQD Use as directed 1 spray in the mouth or throat as needed for throat irritation / pain. Use 1 spray in the mouth or throat every 2 (two) hours as needed (throat pain) for up to 10 days.     polyethylene glycol (MIRALAX / GLYCOLAX) 17 g packet Take 17 g by mouth daily.     propranolol (INDERAL) 20 MG tablet Take 0.5 tablets (10 mg total) by mouth daily. 30 tablet 2   Rimegepant Sulfate (NURTEC) 75 MG TBDP Take 1 tablet (75 mg total) by mouth daily as needed (migraine). 30 tablet 0   thiamine 50 MG tablet Take 50 mg by mouth daily. Take 2 tablets (100 mg total) by mouth daily.     No current facility-administered medications for this visit.    Allergies-reviewed and updated Allergies  Allergen Reactions   Ibuprofen Other (See Comments)    CKD stage 3. Should avoid.   Dilaudid [Hydromorphone Hcl] Itching and Other (See Comments)    Can take with Benadryl.   Iodinated Contrast Media Rash   Tramadol Itching    Social History   Socioeconomic History   Marital status: Married    Spouse name: Not on file   Number of children: 1   Years of education: college   Highest education level: Not on file  Occupational History   Occupation: customer service rep     Employer: [employer]  Tobacco Use   Smoking status: Never    Passive exposure: Never   Smokeless tobacco: Never  Vaping Use   Vaping status: Never Used  Substance and Sexual Activity   Alcohol use: No    Alcohol/week: 0.0 standard drinks of alcohol   Drug use: No   Sexual activity: Not Currently    Partners: Male    Birth control/protection: I.U.D.  Other Topics Concern   Not on file  Social History Narrative   Patient lives with fiance. She has 1 grown son. Works full-time, she Database administrator for attorney.   Caffeine Use: 2 cups daily; sodas occasionally   She has 7 grandchildren      Raised by step father who is still living.       Right Handed    Lives in a two story home but does not use the upstairs.    Social Drivers of Corporate investment banker Strain: Low Risk  (11/30/2023)   Received from [facility]   Overall Financial Resource Strain (CARDIA)    Difficulty of Paying Living Expenses: Not hard at all  Food Insecurity: No Food Insecurity (09/09/2023)   Received from  [facility]   Hunger Vital Sign    Worried About Running Out of Food in the Last Year: Never true    Ran Out of Food in the Last Year: Never true  Transportation Needs: No Transportation Needs (12/03/2023)   Received from [facility] - Transportation    Lack of Transportation (Medical): No    Lack of Transportation (Non-Medical): No  Physical Activity: Inactive (10/26/2022)   Exercise Vital Sign    Days of Exercise per Week: 0 days    Minutes of Exercise per Session: 0 min  Stress: No Stress Concern Present (09/09/2023)   Received from [facility] - Occupational Stress Questionnaire    Feeling of Stress : Not at all  Social Connections: Unknown (08/17/2023)   [facility] from [facility]   Social Network    Social Network: Not on file        Objective:  Physical Exam: LMP 09/10/2021   Gen: NAD, resting  comfortably*** CV: RRR with no murmurs appreciated Pulm: NWOB, CTAB with no crackles, wheezes, or rhonchi GI: Normal bowel sounds present. Soft, Nontender, Nondistended. MSK: No edema, cyanosis, or clubbing noted Skin: Warm, dry Neuro: Grossly normal, moves all extremities Psych: Normal affect and thought content  Summary/Review of work up during hospitalization: ***     Sterling Ucci M. [name], MD 12/09/2023 7:29 AM

## 2023-12-09 NOTE — Telephone Encounter (Signed)
Noted  

## 2023-12-09 NOTE — Telephone Encounter (Signed)
Patient's husband states Patient is currently in rehab after Hospitalization. Will call back to reschedule HFU.

## 2023-12-30 NOTE — Telephone Encounter (Signed)
 Copied from CRM [id number]. Topic: General - Other >> Dec 30, 2023  4:02 PM Gerardine PARAS wrote: Reason for CRM: Nurse Darice called from [facility] regarding patient to update Dr. Kennyth that patient slide out of bed and fell on her face, O2 stats dropped and patient is doing a lot of wheezing. Nurse Darice advised patient husband take her to emergency room.  Also interested in order being put in to put betadine and bandage on big toe. Patient needs referral for respiratory specialist due to waking up at night gasping for air and cardiology referral due to xray from 12/29/2023 showing fluid in patients chest Please call Darice directly at [phone]  and voicemail are fine   See note

## 2024-01-02 ENCOUNTER — Inpatient Hospital Stay: Payer: Self-pay | Admitting: Family Medicine

## 2024-01-02 NOTE — Telephone Encounter (Signed)
 Ok with me. Please place any necessary orders.

## 2024-01-03 ENCOUNTER — Telehealth: Payer: Self-pay

## 2024-01-03 NOTE — Telephone Encounter (Signed)
 Copied from CRM [id number]. Topic: General - Other >> Jan 03, 2024  3:15 PM Victoria A wrote: Reason for CRM: [facility] called to let the office know that the patient coded on January 21, 2024 and is currently in ICU on a ventilator  Please see call msg from [facility] for patient as Heather [name] and advise if anything is needed

## 2024-01-03 NOTE — Telephone Encounter (Signed)
 Ellyn Hack at [phone]  Left VO approval

## 2024-01-04 NOTE — Telephone Encounter (Signed)
 Noted. Do not need to do anything further at this point.  [name]. [name], MD 01/04/2024 7:46 AM

## 2024-01-12 ENCOUNTER — Telehealth: Payer: Self-pay | Admitting: Family Medicine

## 2024-01-12 NOTE — Telephone Encounter (Signed)
Form placed in PCP office to be reviewed  

## 2024-01-12 NOTE — Telephone Encounter (Signed)
Received faxed  document Home Health Certificate (Order ID [id number]), to be filled out by provider. Patient requested to send it back via Fax . Document is located in providers tray at front office.Please advise .

## 2024-01-16 NOTE — Telephone Encounter (Signed)
Form faxed to [phone] Form placed to be scan in patient chart

## 2024-01-31 NOTE — Progress Notes (Signed)
 This encounter was created in error - please disregard.

## 2024-02-09 ENCOUNTER — Encounter: Payer: Medicare Other | Admitting: Internal Medicine

## 2024-02-09 NOTE — Progress Notes (Deleted)
Office Visit Note  Patient: Heather [name]             Date of Birth: [birth year]           MRN: [id number]             PCP: Ardith Dark, MD Referring: Ardith Dark, MD Visit Date: 02/09/2024 Occupation: @GUAROCC @  Subjective:  No chief complaint on file.   History of Present Illness: Heather [name] is a 53 y.o. female ***     Activities of Daily Living:  Patient reports morning stiffness for *** {minute/hour:19697}.   Patient {ACTIONS;DENIES/REPORTS:21021675::"Denies"} nocturnal pain.  Difficulty dressing/grooming: {ACTIONS;DENIES/REPORTS:21021675::"Denies"} Difficulty climbing stairs: {ACTIONS;DENIES/REPORTS:21021675::"Denies"} Difficulty getting out of chair: {ACTIONS;DENIES/REPORTS:21021675::"Denies"} Difficulty using hands for taps, buttons, cutlery, and/or writing: {ACTIONS;DENIES/REPORTS:21021675::"Denies"}  No Rheumatology ROS completed.   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   DNR (do not resuscitate) discussion 12/14/2022   PVD (peripheral vascular disease) (HCC) 12/12/2022   Allergic conjunctivitis 10/12/2022   Mitral valve mass    Right atrial mass    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   Shortness of breath 07/02/2022   Thrombus of venous dialysis catheter (HCC) 02/25/2022   Displacement of vascular dialysis catheter, initial encounter (HCC) 12/18/2021   S/P BKA (below knee amputation), right (HCC) 03/31/2021   Impaired mobility and ADLs 03/25/2021   CVA (cerebral vascular accident) (HCC) 03/04/2021   Pulmonary embolus (HCC) 03/04/2021   Charcot ankle, right 02/23/2021   Hx of cardiac arrest 11/12/2020   Abnormal CT of the chest 09/26/2020   Allergy, unspecified, initial encounter 09/22/2020   Other mechanical complication of other cardiac and vascular devices and implants, initial encounter (HCC) 05/10/2020   Positive ANA (antinuclear antibody)  04/16/2020   Pericardial effusion 06/14/2019   ESRD on hemodialysis (HCC)    Hemiplegia and hemiparesis following cerebral infarction affecting left non-dominant side (HCC) 12/29/2018   IBS (irritable bowel syndrome) 08/06/2017   Dyslipidemia associated with type 2 diabetes mellitus (HCC) 06/25/2016   Proliferative diabetic retinopathy (HCC) 09/29/2015   Chronic cough 10/04/2014   B12 deficiency 05/10/2014   Anemia in chronic kidney disease, on chronic dialysis (HCC) 04/26/2014   CKD (chronic kidney disease) stage 5, GFR less than 15 ml/min (HCC) 04/26/2014   Secondary hyperparathyroidism of renal origin (HCC) 11/27/2013   Posterior subcapsular cataract, bilateral 10/18/2013   T2DM (type 2 diabetes mellitus) (HCC) 02/19/2011   Hypertension associated with diabetes (HCC) 02/19/2011   Gastroparesis due to DM (HCC) 02/19/2011    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HCC), nonhemorrhagic, inferior right cerebellum 12/03/2017   left side weakness in leg   Cyst of left ovary 01/12/2018   Diabetic neuropathy, painful (HCC), on low dose Gabapentin 07/13/2013   DM (diabetes mellitus), type 2, uncontrolled, with renal complications 10/12/2016   DM2 (diabetes mellitus, type 2) (HCC)    Dysfunction of left eustachian tube, with pusatile tinnitus 11/24/2017   Dyslipidemia associated with type 2 diabetes mellitus (HCC) 06/25/2016   ESRD (end stage renal disease) on dialysis [facility])    On hemodialysis in July 2019 via [facility] then switched to [facility] in Nov 2019.    ESRD with anemia (HCC)    Fibromyalgia  Gastroparesis due to DM    GERD (gastroesophageal reflux disease)    Headache    Hypertension associated with diabetes (HCC) 02/19/2011   IBS (irritable bowel syndrome)    Left-sided  weakness 12/10/2017   .   Leiomyoma of uterus    Pancreatitis    PE (pulmonary thromboembolism) (HCC) 11/04/2019   Pneumonia    Proliferative diabetic retinopathy (HCC) 09/29/2015   Pronation deformity of both feet 06/14/2014   Reactive depression 12/10/2017   .   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HCC) 02/17/2018   Thromboembolism (HCC) 04/05/2018   Upper airway cough syndrome, with recs to stay off ACE and take Pepcid q hs 11/15/2016   Urticaria     Family History  Problem Relation Age of Onset   Colon polyps Mother    Diabetes Mother    Heart murmur Father        history essentially unknown   Hypertension Father    Diabetes Sister    Kidney failure Sister        kidney transplant   Diabetes Brother    Retinal degeneration Brother    Heart murmur Son    Stroke Paternal Grandmother    Diabetes Sister    Past Surgical History:  Procedure Laterality Date   A/V FISTULAGRAM Right 03/03/2020   Procedure: Venogram;  Surgeon: Maeola Harman, MD;  Location: [facility] INVASIVE CV LAB;  Service: Cardiovascular;  Laterality: Right;   ARTERY REPAIR Right 02/17/2018   Procedure: EXPLORATION OF RIGHT BRACHIAL ARTERY;  Surgeon: Fransisco Hertz, MD;  Location: [facility] OR;  Service: Vascular;  Laterality: Right;   ARTERY REPAIR Right 02/17/2018   Procedure: BRACHIAL ARTERY EXPLORATION AND TRHOMBECTOMY;  Surgeon: Fransisco Hertz, MD;  Location: [facility] OR;  Service: Vascular;  Laterality: Right;   AV FISTULA PLACEMENT Left 05/09/2017   Procedure: INSERTION OF ARTERIOVENOUS (AV) GRAFT ARM (ARTEGRAFT);  Surgeon: Fransisco Hertz, MD;  Location: [facility] OR;  Service: Vascular;  Laterality: Left;   AV FISTULA PLACEMENT Right 02/17/2018   Procedure: INSERTION OF ARTERIOVENOUS (AV) GORE-TEX GRAFT ARM RIGHT UPPER ARM;  Surgeon: Fransisco Hertz, MD;  Location: [facility] OR;  Service: Vascular;  Laterality: Right;   AV FISTULA  PLACEMENT Left 10/10/2019   Procedure: INSERTION OF LEFT ARTERIOVENOUS (AV) ARTEGRAFT GRAFT ARM;  Surgeon: Maeola Harman, MD;  Location: [facility] OR;  Service: Vascular;  Laterality: Left;   AV FISTULA PLACEMENT Right 03/19/2020   Procedure: INSERTION OF ARTERIOVENOUS (AV) GORE-TEX GRAFT IN RIGHT ARM AND VIABAHN STENT IN RIGHT AXILLARY ARTERY;  Surgeon: Maeola Harman, MD;  Location: [facility] OR;  Service: Vascular;  Laterality: Right;   BASCILIC VEIN TRANSPOSITION Left 08/31/2016   Procedure: BASILIC VEIN TRANSPOSITION FIRST STAGE;  Surgeon: Fransisco Hertz, MD;  Location: [facility] OR;  Service: Vascular;  Laterality: Left;   BRONCHIAL WASHINGS  09/17/2021   Procedure: BRONCHIAL WASHINGS;  Surgeon: Leslye Peer, MD;  Location: [facility] ENDOSCOPY;  Service: Cardiopulmonary;;   BUBBLE STUDY  12/16/2022   Procedure: BUBBLE STUDY;  Surgeon: Little Ishikawa, MD;  Location: [facility] ENDOSCOPY;  Service: Cardiovascular;;   CENTRAL VENOUS CATHETER INSERTION Left 07/24/2018   Procedure: INSERTION CENTRAL LINE ADULT;  Surgeon: Fransisco Hertz, MD;  Location: [facility] OR;  Service: Vascular;  Laterality: Left;   EYE SURGERY     secondary to diabetic retinopathy    FOOT SURGERY Right    t"ook bone out-  maybe hammer toe"   INSERTION OF DIALYSIS CATHETER Left 07/24/2018   Procedure: INSERTION OF TUNNELED DIALYSIS CATHETER;  Surgeon: Fransisco Hertz, MD;  Location: [facility] OR;  Service: Vascular;  Laterality: Left;   IR FLUORO GUIDE CV LINE LEFT  12/19/2021   IR FLUORO GUIDE CV LINE LEFT  12/29/2021   IR FLUORO GUIDE CV LINE RIGHT  07/21/2018   IR FLUORO GUIDE CV LINE RIGHT  06/01/2019   IR FLUORO GUIDE CV LINE RIGHT  05/05/2020   IR FLUORO GUIDE CV LINE RIGHT  10/04/2022   IR REMOVAL TUN CV CATH W/O FL  01/23/2021   IR REMOVAL TUN CV CATH W/O FL  12/19/2021   IR REMOVAL TUN CV CATH W/O FL  10/01/2022   IR US GUIDE VASC ACCESS LEFT  12/19/2021   IR US GUIDE VASC ACCESS RIGHT  07/21/2018   IR US GUIDE VASC ACCESS RIGHT  06/01/2019    IR US GUIDE VASC ACCESS RIGHT  10/04/2022   LIGATION ARTERIOVENOUS GORTEX GRAFT Right 03/20/2020   Procedure: LIGATION ARM ARTERIOVENOUS GORTEX GRAFT With Patch Angioplasty, Thrombectomy;  Surgeon: Maeola Harman, MD;  Location: [facility] OR;  Service: Vascular;  Laterality: Right;   LIGATION OF ARTERIOVENOUS  FISTULA Left 05/09/2017   Procedure: LIGATION OF ARTERIOVENOUS  FISTULA;  Surgeon: Fransisco Hertz, MD;  Location: [facility] OR;  Service: Vascular;  Laterality: Left;   LOOP RECORDER INSERTION N/A 12/05/2017   Procedure: LOOP RECORDER INSERTION;  Surgeon: Marinus Maw, MD;  Location: [facility] INVASIVE CV LAB;  Service: Cardiovascular;  Laterality: N/A;   MYOMECTOMY     REMOVAL OF GRAFT Right 02/17/2018   Procedure: REMOVAL OF RIGHT UPPER ARM ARTERIOVENOUS GRAFT;  Surgeon: Fransisco Hertz, MD;  Location: [facility] OR;  Service: Vascular;  Laterality: Right;   REVISION OF ARTERIOVENOUS GORETEX GRAFT Left 07/24/2018   Procedure: REDO ARTERIOVENOUS GORETEX GRAFT;  Surgeon: Fransisco Hertz, MD;  Location: [facility] OR;  Service: Vascular;  Laterality: Left;   TEE WITHOUT CARDIOVERSION N/A 12/05/2017   Procedure: TRANSESOPHAGEAL ECHOCARDIOGRAM (TEE);  Surgeon: Thurmon Fair, MD;  Location: [facility] ENDOSCOPY;  Service: Cardiovascular;  Laterality: N/A;   TEE WITHOUT CARDIOVERSION N/A 09/30/2022   Procedure: TRANSESOPHAGEAL ECHOCARDIOGRAM (TEE);  Surgeon: Jake Bathe, MD;  Location: [facility] ENDOSCOPY;  Service: Cardiovascular;  Laterality: N/A;   TEE WITHOUT CARDIOVERSION N/A 12/16/2022   Procedure: TRANSESOPHAGEAL ECHOCARDIOGRAM (TEE);  Surgeon: Little Ishikawa, MD;  Location: [facility] ENDOSCOPY;  Service: Cardiovascular;  Laterality: N/A;   UPPER EXTREMITY VENOGRAPHY Left 07/13/2018   Procedure: UPPER EXTREMITY VENOGRAPHY - Central & Left Arm;  Surgeon: Fransisco Hertz, MD;  Location: [facility] INVASIVE CV LAB;  Service: Cardiovascular;  Laterality: Left;   UPPER EXTREMITY VENOGRAPHY Bilateral 09/10/2019   Procedure: UPPER EXTREMITY  VENOGRAPHY;  Surgeon: Maeola Harman, MD;  Location: [facility] INVASIVE CV LAB;  Service: Cardiovascular;  Laterality: Bilateral;   UTERINE FIBROID SURGERY     VIDEO BRONCHOSCOPY N/A 09/17/2021   Procedure: VIDEO BRONCHOSCOPY WITHOUT FLUORO;  Surgeon: Leslye Peer, MD;  Location: [facility] ENDOSCOPY;  Service: Cardiopulmonary;  Laterality: N/A;   VITRECTOMY Bilateral    Social History   Social History Narrative   Patient lives with fiance. She has 1 grown son. Works full-time, she Database administrator for attorney.   Caffeine Use: 2 cups daily; sodas occasionally   She has 7 grandchildren      Raised by step father who is still living.       Right Handed  Lives in a two story home but does not use the upstairs.    Immunization History  Administered Date(s) Administered   Influenza Inj Mdck Quad Pf 09/03/2016   Influenza Inj Mdck Quad With Preservative 09/03/2016   Influenza Split 03/27/2013, 09/03/2016, 09/28/2018   Influenza, Quadrivalent, Recombinant, Inj, Pf 10/20/2022   Influenza, Seasonal, Injecte, Preservative Fre 09/28/2018, 10/18/2019, 09/23/2020   Influenza,inj,Quad PF,6+ Mos 09/20/2013, 09/11/2014, 09/26/2016, 09/28/2018, 10/18/2019, 10/18/2019, 09/23/2020, 09/15/2021   Influenza,inj,quad, With Preservative 10/18/2019, 09/23/2020   Influenza-Unspecified 03/27/2013, 09/03/2016, 09/28/2018, 12/27/2021, 08/30/2023   Moderna Sars-Covid-2 Vaccination 08/24/2020, 09/21/2020   Pneumococcal Conjugate-13 12/24/2016   Pneumococcal Polysaccharide-23 04/26/2014, 09/11/2014, 03/15/2019   Tdap 09/20/2013     Objective: Vital Signs: LMP 09/10/2021    Physical Exam   Musculoskeletal Exam: ***  CDAI Exam: CDAI Score: -- Patient Global: --; Provider Global: -- Swollen: --; Tender: -- Joint Exam 02/09/2024   No joint exam has been documented for this visit   There is currently no information documented on the homunculus. Go to the Rheumatology activity and complete the homunculus  joint exam.  Investigation: No additional findings.  Imaging: No results found.  Recent Labs: Lab Results  Component Value Date   WBC 11.0 08/04/2023   HGB 8.7 (A) 08/04/2023   PLT 248 08/04/2023   NA 133 (A) 09/26/2023   K 4.4 09/26/2023   CL 95 (A) 09/26/2023   CO2 21 09/26/2023   GLUCOSE 136 (H) 12/24/2022   BUN 51 (A) 09/26/2023   CREATININE 8.5 (A) 09/10/2023   BILITOT 0.4 12/12/2022   ALKPHOS 108 12/12/2022   AST 25 07/25/2023   ALT 4 (A) 07/25/2023   PROT 7.9 12/12/2022   ALBUMIN 2.8 (L) 12/24/2022   CALCIUM 9.7 09/26/2023   GFRAA 4 (L) 05/07/2020    Speciality Comments: No specialty comments available.  Procedures:  No procedures performed Allergies: Ibuprofen, Dilaudid [hydromorphone hcl], Iodinated contrast media, and Tramadol   Assessment / Plan:     Visit Diagnoses: No diagnosis found.  Orders: No orders of the defined types were placed in this encounter.  No orders of the defined types were placed in this encounter.   Face-to-face time spent with patient was *** minutes. Greater than 50% of time was spent in counseling and coordination of care.  Follow-Up Instructions: No follow-ups on file.   Fuller Plan, MD  Note - This record has been created using [employer].  Chart creation errors have been sought, but may not always  have been located. Such creation errors do not reflect on  the standard of medical care.

## 2024-02-11 LAB — LAB REPORT - SCANNED: A1c: 5.4

## 2024-02-16 LAB — LAB REPORT - SCANNED: A1c: 5.4

## 2024-03-27 DEATH — deceased

## 2024-03-28 ENCOUNTER — Telehealth: Payer: Self-pay | Admitting: Family Medicine

## 2024-03-28 NOTE — Telephone Encounter (Unsigned)
 Copied from CRM [id number]. Topic: General - Deceased Patient >> 2024/04/15 11:05 AM Heather Killings wrote: Patient husband stated that patient has passed away. She passed away on 04/03/24.

## 2024-03-29 NOTE — Telephone Encounter (Signed)
 See note
